# Patient Record
Sex: Male | Born: 1970
Health system: Southern US, Community
[De-identification: ages and names within clinical notes are randomized; demographics above are authoritative.]

## PROBLEM LIST (undated history)

## (undated) DIAGNOSIS — E119 Type 2 diabetes mellitus without complications: Secondary | ICD-10-CM

## (undated) DIAGNOSIS — I219 Acute myocardial infarction, unspecified: Secondary | ICD-10-CM

## (undated) DIAGNOSIS — I10 Essential (primary) hypertension: Secondary | ICD-10-CM

## (undated) DIAGNOSIS — G709 Myoneural disorder, unspecified: Secondary | ICD-10-CM

## (undated) DIAGNOSIS — N189 Chronic kidney disease, unspecified: Secondary | ICD-10-CM

## (undated) DIAGNOSIS — I255 Ischemic cardiomyopathy: Secondary | ICD-10-CM

## (undated) DIAGNOSIS — I509 Heart failure, unspecified: Secondary | ICD-10-CM

## (undated) DIAGNOSIS — E669 Obesity, unspecified: Secondary | ICD-10-CM

## (undated) DIAGNOSIS — E785 Hyperlipidemia, unspecified: Secondary | ICD-10-CM

## (undated) DIAGNOSIS — E11621 Type 2 diabetes mellitus with foot ulcer: Secondary | ICD-10-CM

## (undated) DIAGNOSIS — K219 Gastro-esophageal reflux disease without esophagitis: Secondary | ICD-10-CM

## (undated) DIAGNOSIS — I2510 Atherosclerotic heart disease of native coronary artery without angina pectoris: Secondary | ICD-10-CM

## (undated) DIAGNOSIS — L97425 Non-pressure chronic ulcer of left heel and midfoot with muscle involvement without evidence of necrosis: Secondary | ICD-10-CM

## (undated) DIAGNOSIS — I5022 Chronic systolic (congestive) heart failure: Secondary | ICD-10-CM

## (undated) HISTORY — DX: Type 2 diabetes mellitus with foot ulcer: E11.621

## (undated) HISTORY — DX: Essential (primary) hypertension: I10

## (undated) HISTORY — DX: Obesity, unspecified: E66.9

## (undated) HISTORY — DX: Hyperlipidemia, unspecified: E78.5

## (undated) HISTORY — PX: OTHER SURGICAL HISTORY: SHX169

## (undated) HISTORY — DX: Non-pressure chronic ulcer of left heel and midfoot with muscle involvement without evidence of necrosis: L97.425

## (undated) HISTORY — DX: Ischemic cardiomyopathy: I25.5

## (undated) HISTORY — DX: Type 2 diabetes mellitus without complications: E11.9

## (undated) HISTORY — DX: Atherosclerotic heart disease of native coronary artery without angina pectoris: I25.10

## (undated) HISTORY — DX: Chronic systolic (congestive) heart failure: I50.22

---
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
## 2007-02-23 ENCOUNTER — Emergency Department (HOSPITAL_COMMUNITY): Admission: EM | Admit: 2007-02-23 | Discharge: 2007-02-23 | Payer: Self-pay | Admitting: Emergency Medicine

## 2010-10-27 ENCOUNTER — Emergency Department (HOSPITAL_COMMUNITY)
Admission: EM | Admit: 2010-10-27 | Discharge: 2010-10-27 | Payer: Self-pay | Source: Home / Self Care | Admitting: Emergency Medicine

## 2010-12-10 ENCOUNTER — Inpatient Hospital Stay (HOSPITAL_COMMUNITY)
Admission: EM | Admit: 2010-12-10 | Discharge: 2010-12-11 | Payer: Self-pay | Source: Home / Self Care | Attending: Family Medicine | Admitting: Family Medicine

## 2010-12-14 ENCOUNTER — Ambulatory Visit: Payer: Self-pay | Admitting: Family Medicine

## 2010-12-14 DIAGNOSIS — E669 Obesity, unspecified: Secondary | ICD-10-CM

## 2010-12-14 DIAGNOSIS — N179 Acute kidney failure, unspecified: Secondary | ICD-10-CM

## 2010-12-14 DIAGNOSIS — I10 Essential (primary) hypertension: Secondary | ICD-10-CM

## 2010-12-14 DIAGNOSIS — E1169 Type 2 diabetes mellitus with other specified complication: Secondary | ICD-10-CM

## 2010-12-14 DIAGNOSIS — E1165 Type 2 diabetes mellitus with hyperglycemia: Secondary | ICD-10-CM | POA: Insufficient documentation

## 2010-12-14 DIAGNOSIS — E785 Hyperlipidemia, unspecified: Secondary | ICD-10-CM

## 2011-01-31 NOTE — Assessment & Plan Note (Signed)
Summary: NP, Hosp f/u for HTN urgency, DM2, HLD, ARF   Vital Signs:  Patient profile:   40 year old male Weight:      292.3 pounds Temp:     97.8 degrees F oral Pulse rate:   92 / minute BP sitting:   155 / 129  (left arm) Cuff size:   large  Vitals Entered By: Jimmy Footman, CMA (December 14, 2010 11:10 AM) CC: hospital follow up////HTN, DM2, HLD, ARF Is Patient Diabetic? No   CC:  hospital follow up////HTN, DM2, HLD, and ARF.  History of Present Illness: PT comes in after being an unassigned patient at [facility]. He has not been to an MD in 1-2 years. He has a remote history of diabetes (was on Janumet) and is not currently taking anything. He was admitted for hypertensive crisis with a BP of > 200/100 and chest pain. He was ruled out for MI and BP was improved  by the time he left the hospital.   hypertension: Pt comes in today still hypertensive. Her is currently taking HCTZ an Metoprolol 25 two times a day. Pt is not having CP anymore. His BNP was 577 when he was in the ED but pt did not show significant signs of CHF. TSH was normal.   DM2: Pt had an A1c of 10.1 while in the hospital. He has not been on any meds for a least 1.5 years. He use to  use samples from the doctors office before and then quit going. He hasn't had any meds for this problem  recently.   HLD:  Pt had an elevated LDL for someone who has diabetes. It was 113. His HDL is low at 30. HE is not currently on meds or an ASA.   ARF: Pt was seen for ARF in the hospital. He has a baseline Cr of 1.1 and it was 1.8 when he got to the ED. His Cr went down to 1.4 upon d/c. Will need to monitor this.   Habits & Providers  Alcohol-Tobacco-Diet     Tobacco Status: never  Current Medications (verified): 1)  Metoprolol Tartrate 25 Mg Tabs (Metoprolol Tartrate) .[name].. 1 By Mouth Two Times A Day 2)  Aspirin 81 Mg Tbec (Aspirin) .... Take 1 Tablet Daily For Heart Attack and Stroke Prevention 3)  Glipizide 5 Mg Tabs (Glipizide)  .... Take 5 Mg Every Morning With Breakfast. You May Increase To One Morning and Evening If Blood Glucose Is > 140 For 7 Days in A Row. 4)  Simvastatin 20 Mg Tabs (Simvastatin) .... Take 1 Pill Every Evening To Get Your Cholesterol Down To 70 5)  Lisinopril-Hydrochlorothiazide 20-25 Mg Tabs (Lisinopril-Hydrochlorothiazide) .... Take 1 By Mouth Qday For Blood Pressure Control and Kidney Protection. Stop This Pill If You Get Dehydrated.  Allergies (verified): No Known Drug Allergies  Past History:  Past Medical History: hypertension DM2  Past Surgical History: none  Family History: dad alive age 61  mom alive age 4  stroke in family history  Social History: lives alone, never married, no exercise, no tobacco, no alcohol, no drugsSmoking Status:  never  Review of Systems        vitals reviewed and pertinent negatives and positives seen in HPI   Physical Exam  General:  Well-developed,well-nourished,in no acute distress; alert,appropriate and cooperative throughout examination. BP retaken with similar results to original reading. 150's/120 Lungs:  Normal respiratory effort, chest expands symmetrically. Lungs are clear to auscultation, no crackles or wheezes. Heart:  Normal  rate and regular rhythm. S1 and S2 normal without gallop, murmur, click, rub or other extra sounds.   Impression & Recommendations:  Problem # 1:  HYPERTENSION, CHRONIC VENOUS, WITH COMPLICATIONS (ICD-459.39) Assessment New Pt'sBP is still very high. Added Lisinopril to help with BP as well as protect kidneys. Will retest kidneys the next time he comes. Pt is to come in about 3 weeks to 1 month (after the holidays). Pt given info about [employer].   Problem # 2:  DIABETES MELLITUS, TYPE II, UNCONTROLLED, W/RENAL COMPS (ICD-250.42) Assessment: New Pt has DM2 with an A1c of 10.1 He has not been on meds for a long while. Plan to start him on BP meds. Pt given an Accucheck meter today for free. PharmD  resident demonstrated how to use it. Pt is to bring meter and log with him to next appointment.   His updated medication list for this problem includes:    Aspirin 81 Mg Tbec (Aspirin) .[name]... Take 1 tablet daily for heart attack and stroke prevention    Glipizide 5 Mg Tabs (Glipizide) .[name]... Take 5 mg every morning with breakfast. you may increase to one morning and evening if blood glucose is > 140 for 7 days in a row.    Lisinopril-hydrochlorothiazide 20-25 Mg Tabs (Lisinopril-hydrochlorothiazide) .[name]... Take 1 by mouth qday for blood pressure control and kidney protection. stop this pill if you get dehydrated.  Problem # 3:  HYPERLIPIDEMIA (ICD-272.4) Assessment: New PT had LDL of 113. Will have goal of 70. Will need to retest his FLP in about 2 months.   His updated medication list for this problem includes:    Simvastatin 20 Mg Tabs (Simvastatin) .[name]... Take 1 pill every evening to get your cholesterol down to 70  Problem # 4:  ACUTE KIDNEY FAILURE UNSPECIFIED (ICD-584.9) Assessment: New PT had some ARF upon admission. Plan to treat him with some hydration (encouraged lots of water) and pt started on Lisinopril. Will retest at his next visit. It improved nicely while in the hospital. I expect he is at baseline. If his kidney fxn gets acutely worse will stop lisinopril.   Complete Medication List: 1)  Metoprolol Tartrate 25 Mg Tabs (Metoprolol tartrate) .[name].. 1 by mouth two times a day 2)  Aspirin 81 Mg Tbec (Aspirin) .... Take 1 tablet daily for heart attack and stroke prevention 3)  Glipizide 5 Mg Tabs (Glipizide) .... Take 5 mg every morning with breakfast. you may increase to one morning and evening if blood glucose is > 140 for 7 days in a row. 4)  Simvastatin 20 Mg Tabs (Simvastatin) .... Take 1 pill every evening to get your cholesterol down to 70 5)  Lisinopril-hydrochlorothiazide 20-25 Mg Tabs (Lisinopril-hydrochlorothiazide) .... Take 1 by mouth qday for blood pressure control and  kidney protection. stop this pill if you get dehydrated.  Patient Instructions: 1)  Your A1c is 10.1, your goal is 7.0 2)  Start taking the glipizide to decrease your blood glucose.  3)  test you blood glucose every morning before you eat or drink anything. Please write it down and bring in your log to help [place] determine when to change meds.  4)  Your BP is 155/120 today. Start your new combo blood pressure pill.  5)  I want to see you in the first week or 2 of January to retest your blood pressure and see how your glucose is doing.  Prescriptions: LISINOPRIL-HYDROCHLOROTHIAZIDE 20-25 MG TABS (LISINOPRIL-HYDROCHLOROTHIAZIDE) take 1 by mouth qday for blood  pressure control and kidney protection. Stop this pill if you get dehydrated.  #30 x 4   Entered and Authorized by:   Jamie Brookes MD   Signed by:   Jamie Brookes MD on 12/14/2010   Method used:   Electronically to        [facility] (retail)       [address]       [place], [place]  [address]       Ph: [phone]       Fax: [phone]   RxID:   [id number] SIMVASTATIN 20 MG TABS (SIMVASTATIN) take 1 pill every evening to get your cholesterol down to 70  #30 x 4   Entered and Authorized by:   Jamie Brookes MD   Signed by:   Jamie Brookes MD on 12/14/2010   Method used:   Electronically to        [facility] (retail)       [address]       [place], [place]  [address]       Ph: [phone]       Fax: [phone]   RxID:   [id number] GLIPIZIDE 5 MG TABS (GLIPIZIDE) take 5 mg every morning with breakfast. You may increase to one morning and evening if blood glucose is > 140 for 7 days in a row.  #60 x 1   Entered and Authorized by:   Jamie Brookes MD   Signed by:   Jamie Brookes MD on 12/14/2010   Method used:   Electronically to        [facility] (retail)       [address]       [place], [place]  [address]        Ph: [phone]       Fax: [phone]   RxID:   [id number] ASPIRIN 81 MG TBEC (ASPIRIN) take 1 tablet daily for heart attack and stroke prevention  #90 x 3   Entered and Authorized by:   Jamie Brookes MD   Signed by:   Jamie Brookes MD on 12/14/2010   Method used:   Electronically to        [facility] (retail)       [address]       [place], [place]  [address]       Ph: [phone]       Fax: [phone]   RxID:   [id number] METOPROLOL TARTRATE 25 MG TABS (METOPROLOL TARTRATE) 1 by mouth two times a day  #60 x 0   Entered and Authorized by:   Jamie Brookes MD   Signed by:   Jamie Brookes MD on 12/14/2010   Method used:   Electronically to        [facility] (retail)       [address]       [place], [place]  [address]       Ph: [phone]       Fax: [phone]   RxID:   [id number]    Orders Added: 1)  [facility] [[id number]]

## 2011-03-02 ENCOUNTER — Inpatient Hospital Stay (INDEPENDENT_AMBULATORY_CARE_PROVIDER_SITE_OTHER)
Admission: RE | Admit: 2011-03-02 | Discharge: 2011-03-02 | Disposition: A | Discharge status: Home / Self Care | Payer: Self-pay | Source: Ambulatory Visit | Attending: Family Medicine | Admitting: Family Medicine

## 2011-03-02 DIAGNOSIS — J069 Acute upper respiratory infection, unspecified: Secondary | ICD-10-CM

## 2011-03-02 LAB — POCT I-STAT, CHEM 8
BUN: 26 mg/dL — ABNORMAL HIGH (ref 6–23)
Chloride: 98 mEq/L (ref 96–112)
HCT: 44 % (ref 39.0–52.0)
Potassium: 3.1 mEq/L — ABNORMAL LOW (ref 3.5–5.1)

## 2011-03-12 LAB — URINE MICROSCOPIC-ADD ON

## 2011-03-12 LAB — URINALYSIS, ROUTINE W REFLEX MICROSCOPIC
Glucose, UA: NEGATIVE mg/dL
Ketones, ur: NEGATIVE mg/dL
Leukocytes, UA: NEGATIVE
Nitrite: NEGATIVE
Specific Gravity, Urine: 1.01 (ref 1.005–1.030)
pH: 6 (ref 5.0–8.0)

## 2011-03-12 LAB — LIPID PANEL
Triglycerides: 117 mg/dL (ref <150)
VLDL: 23 mg/dL (ref 0–40)

## 2011-03-12 LAB — HEMOGLOBIN A1C
Hgb A1c MFr Bld: 10.1 % — ABNORMAL HIGH (ref <5.7)
Mean Plasma Glucose: 243 mg/dL — ABNORMAL HIGH (ref <117)

## 2011-03-12 LAB — PROTIME-INR: Prothrombin Time: 14.1 seconds (ref 11.6–15.2)

## 2011-03-12 LAB — COMPREHENSIVE METABOLIC PANEL
Alkaline Phosphatase: 38 U/L — ABNORMAL LOW (ref 39–117)
BUN: 20 mg/dL (ref 6–23)
CO2: 28 mEq/L (ref 19–32)
Chloride: 104 mEq/L (ref 96–112)
Glucose, Bld: 195 mg/dL — ABNORMAL HIGH (ref 70–99)
Potassium: 3.7 mEq/L (ref 3.5–5.1)
Total Bilirubin: 1 mg/dL (ref 0.3–1.2)

## 2011-03-12 LAB — RAPID URINE DRUG SCREEN, HOSP PERFORMED
Barbiturates: NOT DETECTED
Benzodiazepines: NOT DETECTED

## 2011-03-12 LAB — DIFFERENTIAL
Basophils Absolute: 0 10*3/uL (ref 0.0–0.1)
Lymphocytes Relative: 26 % (ref 12–46)
Lymphs Abs: 3.3 10*3/uL (ref 0.7–4.0)
Monocytes Absolute: 0.9 10*3/uL (ref 0.1–1.0)
Neutro Abs: 8.1 10*3/uL — ABNORMAL HIGH (ref 1.7–7.7)

## 2011-03-12 LAB — CK TOTAL AND CKMB (NOT AT ARMC)
CK, MB: 3.5 ng/mL (ref 0.3–4.0)
Total CK: 243 U/L — ABNORMAL HIGH (ref 7–232)

## 2011-03-12 LAB — MRSA PCR SCREENING: MRSA by PCR: NEGATIVE

## 2011-03-12 LAB — POCT I-STAT, CHEM 8
Chloride: 105 mEq/L (ref 96–112)
Creatinine, Ser: 1.8 mg/dL — ABNORMAL HIGH (ref 0.4–1.5)
HCT: 44 % (ref 39.0–52.0)
Hemoglobin: 15 g/dL (ref 13.0–17.0)
Potassium: 3.5 mEq/L (ref 3.5–5.1)
Sodium: 138 mEq/L (ref 135–145)

## 2011-03-12 LAB — BRAIN NATRIURETIC PEPTIDE: Pro B Natriuretic peptide (BNP): 577 pg/mL — ABNORMAL HIGH (ref 0.0–100.0)

## 2011-03-12 LAB — POCT CARDIAC MARKERS: Myoglobin, poc: 131 ng/mL (ref 12–200)

## 2011-03-12 LAB — ETHANOL: Alcohol, Ethyl (B): <5 mg/dL (ref 0–10)

## 2011-03-12 LAB — CBC
HCT: 43.1 % (ref 39.0–52.0)
RBC: 5.5 MIL/uL (ref 4.22–5.81)
RDW: 13.3 % (ref 11.5–15.5)
WBC: 12.4 10*3/uL — ABNORMAL HIGH (ref 4.0–10.5)

## 2011-03-12 LAB — TROPONIN I: Troponin I: 0.06 ng/mL (ref 0.00–0.06)

## 2011-03-13 LAB — POCT I-STAT, CHEM 8
BUN: 20 mg/dL (ref 6–23)
Calcium, Ion: 1.15 mmol/L (ref 1.12–1.32)
Chloride: 103 mEq/L (ref 96–112)
Creatinine, Ser: 1.1 mg/dL (ref 0.4–1.5)
Glucose, Bld: 236 mg/dL — ABNORMAL HIGH (ref 70–99)
TCO2: 26 mmol/L (ref 0–100)

## 2011-03-13 LAB — CBC
HCT: 37.5 % — ABNORMAL LOW (ref 39.0–52.0)
Hemoglobin: 12.5 g/dL — ABNORMAL LOW (ref 13.0–17.0)
MCV: 78.1 fL (ref 78.0–100.0)
RDW: 12.6 % (ref 11.5–15.5)
WBC: 12.3 10*3/uL — ABNORMAL HIGH (ref 4.0–10.5)

## 2011-03-13 LAB — DIFFERENTIAL
Eosinophils Relative: 1 % (ref 0–5)
Lymphocytes Relative: 17 % (ref 12–46)
Lymphs Abs: 2.1 10*3/uL (ref 0.7–4.0)
Monocytes Absolute: 1.1 10*3/uL — ABNORMAL HIGH (ref 0.1–1.0)
Monocytes Relative: 9 % (ref 3–12)
Neutro Abs: 9 10*3/uL — ABNORMAL HIGH (ref 1.7–7.7)

## 2011-05-18 ENCOUNTER — Other Ambulatory Visit: Payer: Self-pay | Admitting: Family Medicine

## 2011-05-19 NOTE — Telephone Encounter (Signed)
Refill request

## 2011-11-02 ENCOUNTER — Inpatient Hospital Stay (HOSPITAL_COMMUNITY)
Admission: EM | Admit: 2011-11-02 | Discharge: 2011-11-06 | DRG: 246 | Disposition: A | Discharge status: Home / Self Care | Payer: Self-pay | Attending: Internal Medicine | Admitting: Internal Medicine

## 2011-11-02 ENCOUNTER — Emergency Department (HOSPITAL_COMMUNITY): Payer: Self-pay

## 2011-11-02 DIAGNOSIS — E669 Obesity, unspecified: Secondary | ICD-10-CM | POA: Diagnosis present

## 2011-11-02 DIAGNOSIS — N179 Acute kidney failure, unspecified: Secondary | ICD-10-CM

## 2011-11-02 DIAGNOSIS — Z91199 Patient's noncompliance with other medical treatment and regimen due to unspecified reason: Secondary | ICD-10-CM

## 2011-11-02 DIAGNOSIS — I2589 Other forms of chronic ischemic heart disease: Secondary | ICD-10-CM | POA: Diagnosis present

## 2011-11-02 DIAGNOSIS — IMO0001 Reserved for inherently not codable concepts without codable children: Secondary | ICD-10-CM | POA: Diagnosis present

## 2011-11-02 DIAGNOSIS — Z9119 Patient's noncompliance with other medical treatment and regimen: Secondary | ICD-10-CM

## 2011-11-02 DIAGNOSIS — E1165 Type 2 diabetes mellitus with hyperglycemia: Secondary | ICD-10-CM | POA: Diagnosis present

## 2011-11-02 DIAGNOSIS — I5021 Acute systolic (congestive) heart failure: Secondary | ICD-10-CM

## 2011-11-02 DIAGNOSIS — I509 Heart failure, unspecified: Secondary | ICD-10-CM | POA: Diagnosis present

## 2011-11-02 DIAGNOSIS — I5023 Acute on chronic systolic (congestive) heart failure: Secondary | ICD-10-CM | POA: Diagnosis present

## 2011-11-02 DIAGNOSIS — E785 Hyperlipidemia, unspecified: Secondary | ICD-10-CM | POA: Diagnosis present

## 2011-11-02 DIAGNOSIS — I214 Non-ST elevation (NSTEMI) myocardial infarction: Principal | ICD-10-CM | POA: Diagnosis present

## 2011-11-02 DIAGNOSIS — I255 Ischemic cardiomyopathy: Secondary | ICD-10-CM | POA: Diagnosis present

## 2011-11-02 DIAGNOSIS — I2510 Atherosclerotic heart disease of native coronary artery without angina pectoris: Secondary | ICD-10-CM | POA: Diagnosis present

## 2011-11-02 DIAGNOSIS — I10 Essential (primary) hypertension: Secondary | ICD-10-CM | POA: Diagnosis present

## 2011-11-02 LAB — COMPREHENSIVE METABOLIC PANEL
Alkaline Phosphatase: 72 U/L (ref 39–117)
BUN: 23 mg/dL (ref 6–23)
Creatinine, Ser: 1.06 mg/dL (ref 0.50–1.35)
GFR calc Af Amer: >90 mL/min (ref >90)
Glucose, Bld: 414 mg/dL — ABNORMAL HIGH (ref 70–99)
Potassium: 3.7 mEq/L (ref 3.5–5.1)
Total Bilirubin: 0.5 mg/dL (ref 0.3–1.2)
Total Protein: 7.2 g/dL (ref 6.0–8.3)

## 2011-11-02 LAB — GLUCOSE, CAPILLARY: Glucose-Capillary: 315 mg/dL — ABNORMAL HIGH (ref 70–99)

## 2011-11-02 LAB — CBC
HCT: 44.8 % (ref 39.0–52.0)
Hemoglobin: 16.2 g/dL (ref 13.0–17.0)
MCH: 28.6 pg (ref 26.0–34.0)
MCHC: 36.2 g/dL — ABNORMAL HIGH (ref 30.0–36.0)
MCV: 79.2 fL (ref 78.0–100.0)
Platelets: 213 10*3/uL (ref 150–400)
RBC: 5.66 MIL/uL (ref 4.22–5.81)
RDW: 12.7 % (ref 11.5–15.5)
WBC: 11.9 10*3/uL — ABNORMAL HIGH (ref 4.0–10.5)

## 2011-11-02 LAB — DIFFERENTIAL
Basophils Absolute: 0 10*3/uL (ref 0.0–0.1)
Basophils Relative: 0 % (ref 0–1)
Eosinophils Absolute: 0.2 10*3/uL (ref 0.0–0.7)
Eosinophils Relative: 2 % (ref 0–5)
Lymphocytes Relative: 26 % (ref 12–46)
Lymphs Abs: 3.1 10*3/uL (ref 0.7–4.0)
Monocytes Absolute: 0.7 10*3/uL (ref 0.1–1.0)
Monocytes Relative: 6 % (ref 3–12)
Neutro Abs: 7.9 10*3/uL — ABNORMAL HIGH (ref 1.7–7.7)
Neutrophils Relative %: 67 % (ref 43–77)

## 2011-11-02 LAB — CK TOTAL AND CKMB (NOT AT ARMC)
CK, MB: 12.3 ng/mL (ref 0.3–4.0)
Relative Index: 4.4 — ABNORMAL HIGH (ref 0.0–2.5)
Total CK: 278 U/L — ABNORMAL HIGH (ref 7–232)

## 2011-11-02 LAB — POCT I-STAT TROPONIN I: Troponin i, poc: 0.71 ng/mL (ref 0.00–0.08)

## 2011-11-02 LAB — TROPONIN I: Troponin I: 2.93 ng/mL (ref <0.30)

## 2011-11-02 MED ORDER — NITROGLYCERIN 0.4 MG SL SUBL
0.4000 mg | SUBLINGUAL_TABLET | SUBLINGUAL | Status: DC | PRN
  Filled 2011-11-02: qty 25

## 2011-11-02 MED ORDER — SODIUM CHLORIDE 0.9 % IV SOLN: INTRAVENOUS | Status: DC

## 2011-11-02 MED ORDER — PANTOPRAZOLE SODIUM 40 MG PO TBEC
40.0000 mg | DELAYED_RELEASE_TABLET | Freq: Every day | ORAL | Status: DC
  Administered 2011-11-03 – 2011-11-05 (×4): 40 mg via ORAL
  Filled 2011-11-02 (×3): qty 1

## 2011-11-02 MED ORDER — INSULIN ASPART 100 UNIT/ML ~~LOC~~ SOLN
0.0000 [IU] | Freq: Three times a day (TID) | SUBCUTANEOUS | Status: DC
  Administered 2011-11-03 (×2): 8 [IU] via SUBCUTANEOUS

## 2011-11-02 MED ORDER — ENOXAPARIN SODIUM 40 MG/0.4ML ~~LOC~~ SOLN
40.0000 mg | SUBCUTANEOUS | Status: DC
  Filled 2011-11-02: qty 0.4

## 2011-11-02 MED ORDER — METOPROLOL TARTRATE 50 MG PO TABS
50.0000 mg | ORAL_TABLET | Freq: Two times a day (BID) | ORAL | Status: DC
  Administered 2011-11-03 (×2): 50 mg via ORAL
  Filled 2011-11-02 (×9): qty 1

## 2011-11-02 MED ORDER — ENOXAPARIN SODIUM 150 MG/ML ~~LOC~~ SOLN
130.0000 mg | Freq: Two times a day (BID) | SUBCUTANEOUS | Status: DC
  Administered 2011-11-03 (×2): 130 mg via SUBCUTANEOUS
  Filled 2011-11-02 (×9): qty 1

## 2011-11-02 MED ORDER — MORPHINE SULFATE 2 MG/ML IJ SOLN
2.0000 mg | INTRAMUSCULAR | Status: DC | PRN
  Administered 2011-11-04: 2 mg via INTRAVENOUS
  Filled 2011-11-02: qty 1

## 2011-11-02 MED ORDER — ASPIRIN EC 325 MG PO TBEC
325.0000 mg | DELAYED_RELEASE_TABLET | Freq: Every day | ORAL | Status: DC
  Administered 2011-11-03 (×2): 325 mg via ORAL
  Filled 2011-11-02: qty 1

## 2011-11-02 NOTE — H&P (Signed)
NAME:  [name], CIFELLI NO.:  [id number]  MEDICAL RECORD NO.:  [id number]  LOCATION:  [facility]                         FACILITY:  [facility]  PHYSICIAN:  Talmage Nap, MD  DATE OF BIRTH:  October 24, 1971  DATE OF ADMISSION:  11/02/2011 DATE OF DISCHARGE:                             HISTORY & PHYSICAL   PRIMARY CARE PHYSICIAN:  Unassigned.  History obtainable from the patient.  CHIEF COMPLAINT:  Retrosternal chest pain, which started at work at about 11:30 a.m. on November 02, 2011.  HISTORY OF PRESENT ILLNESS:  The patient is a 40 year old Caucasian male, obese with a history of hypertension, noncompliant with medication (has not taken medication in months) and also hyperglycemia, not very sure whether he was diabetic and not on any medication, was said to have been in stable health until about 11:30 a.m. at work when the patient developed retrosternal chest pain.  He described the pain as dull about 6/10 in intensity with no radiation.  He denied any history of diaphoresis.  He denied any history of shortness of breath.  He denied any nausea or vomiting.  No fever.  No chills.  No rigor.  The patient took 1 tablet of baby aspirin.  The pain was said to have persisted and subsequently came to the emergency room to be evaluated.  PAST MEDICAL HISTORY:  Positive for hypertension and questionable diabetes mellitus.  PAST SURGICAL HISTORY:  He has no known past surgical history.  MEDICATIONS:  Preadmission med include lisinopril doses unknown as well as hydrochlorothiazide doses unknown.  ALLERGIES:  He has no known allergies.  SOCIAL HISTORY:  Negative for tobacco use.  He drinks alcohol occasionally and works in a [employer].  FAMILY HISTORY:  York Spaniel to be positive for coronary artery disease.  Mom had a bypass surgery.  REVIEW OF SYSTEMS:  The patient presently denies any history of headaches.  No blurred vision.  No nausea or vomiting.  No fever.  No chills.   No rigor.  Chest pain has abated.  No shortness of breath. Denies any PND or orthopnea.  No abdominal discomfort.  No diarrhea or hematochezia.  No dysuria or hematuria.  No swelling of the lower extremity.  No intolerance to heat or cold and no neuropsychiatric disorder.  PHYSICAL EXAMINATION:  GENERAL:  Obese young man with suboptimal hydration, not in any obvious respiratory distress. VITAL SIGNS:  Initial vital signs, blood pressure 174/134, pulse 117, respiratory rate is 15, temperature is 97.4.  Following IV nitro drip, blood pressure is now 127/78, pulse 92, respiratory rate is 16. HEENT:  Pupils are reactive to light and extraocular muscles are intact. NECK:  No jugular venous distention.  No carotid bruit.  No lymphadenopathy. CHEST:  Clear to auscultation. HEART:  Sounds are 1 and 2. ABDOMEN:  Soft, obese, and nontender.  Liver, spleen, and kidney not palpable.  Bowel sounds are positive. EXTREMITIES:  No pedal edema. NEUROLOGIC:  Nonfocal. MUSCULOSKELETAL SYSTEM:  Unremarkable. SKIN:  Showed decreased turgor.  LABORATORY DATA:  Initial complete blood count with differential showed WBC of 11.9, hemoglobin of 16.2, hematocrit of 44.8, MCV of 78.2, platelet count of 213, neutrophils 67%, and absolute neutrophil count is 7.9.  First set of cardiac markers, troponin-I 0.07 and second set 0.71. CK, total of CK-MB not ordered.  Comprehensive metabolic panel showed sodium of 132, potassium of 3.7, chloride of 95 with a bicarb of 25, glucose is 414, BUN is 23, creatinine is 1.03.  LFTs are normal. Imaging studies done on the patient include portable chest x-ray, which was essentially unremarkable.  EKG showed normal sinus rhythm with a rate of 118, left atrial enlargement as well as LVH by voltage criteria. There is no acute ST-wave change noted.  There is nonspecific T-wave changes in the anterolateral leads.  IMPRESSION: 1. Chest pain, rule out acute coronary syndrome. 2.  Hypertensive emergency. 3. Obesity. 4. Hyperglycemia (non-ketotic).  PLAN:  Plan is to admit the patient to telemetry.  The patient will be rehydrated with normal saline IV to go at rate of 150 mL an hour.  He will be on Accu-Cheks t.i.d. with a.c. at bedtime with regular insulin sliding scale (moderate scale).  He will also be on aspirin 325 mg p.o. daily.  The patient will be on morphine 2 mg IV q.4 hours p.r.n. for chest pain.  Since his blood pressure is now controlled, nitro drip will be discontinued.  The patient will be on Lopressor 150 mg p.o. b.i.d., lisinopril 20 mg p.o. daily. GI prophylaxis will be done with Protonix 40 mg p.o. daily and DVT prophylaxis with Lovenox 40 mg subcu q.24 hours.  Further workup to be ordered on this patient will include cardiac enzymes q.6 h x3, lipid panel, thyroid panel, which will include TSH, T3, T4.  Hemoglobin A1c, CBC, CMP, and magnesium will be repeated in a.m.  The patient will also have a 2D echo done.  He will be followed and evaluated on day-to-day basis.     Talmage Nap, MD     CN/MEDQ  D:  11/02/2011  T:  11/02/2011  Job:  [id number]  Electronically Signed by Talmage Nap  on 11/02/2011 06:41:15 PM

## 2011-11-03 ENCOUNTER — Other Ambulatory Visit: Payer: Self-pay

## 2011-11-03 DIAGNOSIS — R072 Precordial pain: Secondary | ICD-10-CM

## 2011-11-03 DIAGNOSIS — R079 Chest pain, unspecified: Secondary | ICD-10-CM

## 2011-11-03 DIAGNOSIS — I214 Non-ST elevation (NSTEMI) myocardial infarction: Secondary | ICD-10-CM | POA: Diagnosis present

## 2011-11-03 LAB — COMPREHENSIVE METABOLIC PANEL
Alkaline Phosphatase: 62 U/L (ref 39–117)
BUN: 21 mg/dL (ref 6–23)
Chloride: 98 mEq/L (ref 96–112)
GFR calc Af Amer: >90 mL/min (ref >90)
GFR calc non Af Amer: >90 mL/min (ref >90)
Glucose, Bld: 301 mg/dL — ABNORMAL HIGH (ref 70–99)
Potassium: 3.8 mEq/L (ref 3.5–5.1)
Total Bilirubin: 0.6 mg/dL (ref 0.3–1.2)

## 2011-11-03 LAB — PROTIME-INR
INR: 1.01 (ref 0.00–1.49)
Prothrombin Time: 13.5 seconds (ref 11.6–15.2)

## 2011-11-03 LAB — CBC
HCT: 41.7 % (ref 39.0–52.0)
Hemoglobin: 13.6 g/dL (ref 13.0–17.0)
MCH: 26.4 pg (ref 26.0–34.0)
MCH: 28.6 pg (ref 26.0–34.0)
MCHC: 35.5 g/dL (ref 30.0–36.0)
MCV: 80.6 fL (ref 78.0–100.0)
Platelets: 222 10*3/uL (ref 150–400)
RBC: 5.15 MIL/uL (ref 4.22–5.81)
RBC: 5.1 MIL/uL (ref 4.22–5.81)
RDW: 12.8 % (ref 11.5–15.5)

## 2011-11-03 LAB — T4, FREE: Free T4: 1.08 ng/dL (ref 0.80–1.80)

## 2011-11-03 LAB — DIFFERENTIAL
Lymphs Abs: 1.9 10*3/uL (ref 0.7–4.0)
Monocytes Relative: 5 % (ref 3–12)
Neutro Abs: 7.5 10*3/uL (ref 1.7–7.7)
Neutrophils Relative %: 75 % (ref 43–77)

## 2011-11-03 LAB — GLUCOSE, CAPILLARY
Glucose-Capillary: 320 mg/dL — ABNORMAL HIGH (ref 70–99)
Glucose-Capillary: 311 mg/dL — ABNORMAL HIGH (ref 70–99)
Glucose-Capillary: 256 mg/dL — ABNORMAL HIGH (ref 70–99)
Glucose-Capillary: 212 mg/dL — ABNORMAL HIGH (ref 70–99)
Glucose-Capillary: 220 mg/dL — ABNORMAL HIGH (ref 70–99)

## 2011-11-03 LAB — BASIC METABOLIC PANEL
CO2: 28 mEq/L (ref 19–32)
Calcium: 9.1 mg/dL (ref 8.4–10.5)
Creatinine, Ser: 1.08 mg/dL (ref 0.50–1.35)
GFR calc non Af Amer: 86 mL/min — ABNORMAL LOW (ref >90)
Glucose, Bld: 263 mg/dL — ABNORMAL HIGH (ref 70–99)
Sodium: 135 mEq/L (ref 135–145)

## 2011-11-03 LAB — T4: T4, Total: 7.7 ug/dL (ref 5.0–12.5)

## 2011-11-03 LAB — HEMOGLOBIN A1C
Hgb A1c MFr Bld: 13 % — ABNORMAL HIGH (ref <5.7)
Mean Plasma Glucose: 324 mg/dL — ABNORMAL HIGH (ref <117)

## 2011-11-03 LAB — MAGNESIUM: Magnesium: 1.7 mg/dL (ref 1.5–2.5)

## 2011-11-03 LAB — CARDIAC PANEL(CRET KIN+CKTOT+MB+TROPI)
Relative Index: 5.3 — ABNORMAL HIGH (ref 0.0–2.5)
Troponin I: 5.11 ng/mL (ref <0.30)

## 2011-11-03 LAB — T3, FREE: T3, Free: 2.8 pg/mL (ref 2.3–4.2)

## 2011-11-03 MED ORDER — SODIUM CHLORIDE 0.9 % IV SOLN
1.0000 mL/kg/h | INTRAVENOUS | Status: DC
  Administered 2011-11-04: 1 mL/kg/h via INTRAVENOUS

## 2011-11-03 MED ORDER — ROSUVASTATIN CALCIUM 40 MG PO TABS
40.0000 mg | ORAL_TABLET | ORAL | Status: AC
  Administered 2011-11-03: 40 mg via ORAL
  Filled 2011-11-03: qty 1

## 2011-11-03 MED ORDER — INSULIN ASPART 100 UNIT/ML ~~LOC~~ SOLN
0.0000 [IU] | SUBCUTANEOUS | Status: DC
  Administered 2011-11-03 – 2011-11-04 (×2): 3 [IU] via SUBCUTANEOUS
  Administered 2011-11-04 (×2): 5 [IU] via SUBCUTANEOUS
  Administered 2011-11-04: 2 [IU] via SUBCUTANEOUS

## 2011-11-03 MED ORDER — INSULIN ASPART 100 UNIT/ML ~~LOC~~ SOLN: 0.0000 [IU] | Freq: Every day | SUBCUTANEOUS | Status: DC

## 2011-11-03 MED ORDER — SODIUM CHLORIDE 0.9 % IJ SOLN: 3.0000 mL | INTRAMUSCULAR | Status: DC | PRN

## 2011-11-03 MED ORDER — SODIUM CHLORIDE 0.9 % IJ SOLN
3.0000 mL | Freq: Two times a day (BID) | INTRAMUSCULAR | Status: DC
  Administered 2011-11-03 – 2011-11-05 (×3): 3 mL via INTRAVENOUS

## 2011-11-03 MED ORDER — LISINOPRIL 20 MG PO TABS
20.0000 mg | ORAL_TABLET | Freq: Every day | ORAL | Status: DC
  Administered 2011-11-03 – 2011-11-06 (×4): 20 mg via ORAL
  Filled 2011-11-03 (×7): qty 1

## 2011-11-03 MED ORDER — ASPIRIN 81 MG PO CHEW
324.0000 mg | CHEWABLE_TABLET | ORAL | Status: AC
  Administered 2011-11-04: 324 mg via ORAL
  Filled 2011-11-03: qty 4

## 2011-11-03 MED ORDER — NITROGLYCERIN 2 % TD OINT
1.0000 [in_us] | TOPICAL_OINTMENT | Freq: Four times a day (QID) | TRANSDERMAL | Status: DC
  Administered 2011-11-03 (×2): 1 [in_us] via TOPICAL
  Filled 2011-11-03 (×14): qty 1

## 2011-11-03 MED ORDER — ROSUVASTATIN CALCIUM 40 MG PO TABS
40.0000 mg | ORAL_TABLET | ORAL | Status: DC
  Administered 2011-11-04 – 2011-11-06 (×2): 40 mg via ORAL
  Filled 2011-11-03 (×7): qty 1

## 2011-11-03 MED ORDER — SODIUM CHLORIDE 0.9 % IV SOLN: 250.0000 mL | INTRAVENOUS | Status: DC

## 2011-11-03 MED ORDER — SODIUM CHLORIDE 0.9 % IV SOLN
250.0000 mL | INTRAVENOUS | Status: DC
  Administered 2011-11-03: 250 mL via INTRAVENOUS

## 2011-11-03 MED ORDER — LEVALBUTEROL HCL 0.63 MG/3ML IN NEBU
0.6300 mg | INHALATION_SOLUTION | RESPIRATORY_TRACT | Status: DC | PRN
  Administered 2011-11-03: 0.63 mg via RESPIRATORY_TRACT
  Filled 2011-11-03 (×3): qty 3

## 2011-11-03 MED ORDER — ACETAMINOPHEN 325 MG PO TABS
650.0000 mg | ORAL_TABLET | ORAL | Status: DC | PRN
  Administered 2011-11-03: 650 mg via ORAL
  Filled 2011-11-03: qty 2

## 2011-11-03 NOTE — Progress Notes (Signed)
Subjective:  No additional chest pain. Blood pressure has improved. No sob, cough or hemoptysis  Objective:  Vital Signs in the last 24 hours: Temp:  [97.7 F (36.5 C)-98.9 F (37.2 C)] 98.6 F (37 C) (11/04 0500) Pulse Rate:  [91-105] 105  (11/04 0500) Resp:  [16-20] 16  (11/04 0500) BP: (125-155)/(81-109) 147/109 mmHg (11/04 0500) SpO2:  [97 %-100 %] 97 % (11/04 0500) Weight:  [295 lb (133.811 kg)-295 lb 4.8 oz (133.947 kg)] 295 lb (133.811 kg) (11/03 1900)  Intake/Output from previous day: 11/03 0701 - 11/04 0700 In: 1590 [P.O.:240; I.V.:1350] Out: 200 [Urine:200] Intake/Output from this shift:    Physical Exam: Well appearing obese, young man NAD HEENT: Unremarkable Neck:  No JVD, no thyromegally Lymphatics:  No adenopathy Back:  No CVA tenderness Lungs:  Clear with no wheezes, rales, or rhonchi. HEART:  Regular rate rhythm, no murmurs, no rubs, no clicks heart sounds are distant Abd:  obese, positive bowel sounds, no organomegally, no rebound, no guarding Ext:  2 plus pulses, no edema, no cyanosis, no clubbing Skin:  No rashes no nodules Neuro:  CN II through XII intact, motor grossly intact  Lab Results:  Basename 11/03/11 0630 11/02/11 1232  WBC 10.0 11.9*  HGB 13.6 16.2  PLT 202 213    Basename 11/03/11 0630 11/02/11 1232  NA 134* 132*  K 3.8 3.7  CL 98 95*  CO2 25 25  GLUCOSE 301* 414*  BUN 21 23  CREATININE 1.01 1.06    Basename 11/03/11 0530 11/03/11 0008  TROPONINI 6.13* 5.11*   Hepatic Function Panel  Basename 11/03/11 0630  PROT 6.7  ALBUMIN 3.4*  AST 39*  ALT 24  ALKPHOS 62  BILITOT 0.6  BILIDIR --  IBILI --   No results found for this basename: CHOL in the last 72 hours No results found for this basename: PROTIME in the last 72 hours  Imaging: Imaging results have been reviewed  Cardiac Studies:ECG NSR with no acute STT changes  Assessment/Plan:  Principal Problem:  *MI, acute, non ST segment elevation - He is pain free. He  will continue his current meds and undergo left heart catheterization tomorrow. Active Problems:  DIABETES MELLITUS, TYPE II, UNCONTROLLED, W/RENAL COMPS =- His sugar is better but will need additional treatment likely with insulin.  HYPERTENSION, CHRONIC VENOUS, WITH COMPLICATIONS - His blood pressure is improved but he will require additional medication. Will continue metoprolol for now.    LOS: 1 day    Lewayne Bunting 11/03/2011, 9:43 AM

## 2011-11-03 NOTE — Progress Notes (Signed)
  Echocardiogram 2D Echocardiogram with Definity has been performed.  [name] 11/03/2011, 5:39 PM

## 2011-11-03 NOTE — Progress Notes (Signed)
Subjective: Patient doing okay. Is tired. No chest pain or shortness of breath.  Objective: Weight change:   Intake/Output Summary (Last 24 hours) at 11/03/11 1255 Last data filed at 11/03/11 0500  Gross per 24 hour  Intake   1590 ml  Output    200 ml  Net   1390 ml   BP 147/109  Pulse 105  Temp(Src) 98.6 F (37 C) (Oral)  Resp 16  Ht 6\' 6"  (1.981 m)  Wt 133.811 kg (295 lb)  BMI 34.09 kg/m2  SpO2 97% General appearance: alert, cooperative and no distress Head: Normocephalic, without obvious abnormality, atraumatic Lungs: clear to auscultation bilaterally Heart: regular rate and rhythm, S1, S2 normal, no murmur, click, rub or gallop Abdomen: soft, non-tender; bowel sounds normal; no masses,  no organomegaly and Mild obesity Extremities: extremities normal, atraumatic, no cyanosis or edema  Lab Results: Basic Metabolic Panel:  Basename 11/03/11 0630 11/02/11 1232  NA 134* 132*  K 3.8 3.7  CL 98 95*  CO2 25 25  GLUCOSE 301* 414*  BUN 21 23  CREATININE 1.01 1.06  CALCIUM 9.1 10.1  MG 1.7 --  PHOS -- --   Liver Function Tests:  Basename 11/03/11 0630 11/02/11 1232  AST 39* 18  ALT 24 23  ALKPHOS 62 72  BILITOT 0.6 0.5  PROT 6.7 7.2  ALBUMIN 3.4* 3.8   No results found for this basename: LIPASE:2,AMYLASE:2 in the last 72 hours No results found for this basename: AMMONIA:2 in the last 72 hours CBC:  Basename 11/03/11 0630 11/02/11 1232  WBC 10.0 11.9*  NEUTROABS 7.5 7.9*  HGB 13.6 16.2  HCT 41.7 44.8  MCV 81.0 79.2  PLT 202 213   Cardiac Enzymes:  Basename 11/03/11 0530 11/03/11 0008 11/02/11 1708  CKTOTAL 380* 365* 278*  CKMB 19.5* 19.4* 12.3*  CKMBINDEX -- -- --  TROPONINI 6.13* 5.11* 2.93*   BNP: No results found for this basename: POCBNP:3 in the last 72 hours D-Dimer: No results found for this basename: DDIMER:2 in the last 72 hours CBG:  Basename 11/03/11 0835 11/02/11 2307 11/02/11 2048 11/02/11 1618  GLUCAP 264* 311* 320* 315*    Hemoglobin A1C:  Basename 11/02/11 1951  HGBA1C 13.0*   Thyroid Function Tests:  Basename 11/02/11 1951 11/02/11 1708  TSH 2.189 --  T4TOTAL -- 7.7  FREET4 1.08 --  T3FREE 2.8 --  THYROIDAB -- --   Anemia Panel: No results found for this basename: VITAMINB12,FOLATE,FERRITIN,TIBC,IRON,RETICCTPCT in the last 72 hours Coagulation:  Basename 11/03/11 0630  INR 1.01   Studies/Results: Dg Chest Portable 1 View  11/02/2011   IMPRESSION: Low volume exam.  No acute chest finding  Original Report Authenticated By: [name]. Ruel Favors, M.D.   Medications: Scheduled Meds:   . aspirin EC  325 mg Oral Daily  . enoxaparin (LOVENOX) injection  130 mg Subcutaneous Q12H  . insulin aspart  0-15 Units Subcutaneous TID WC  . insulin aspart  0-5 Units Subcutaneous QHS  . lisinopril  20 mg Oral Daily  . metoprolol tartrate  50 mg Oral BID  . nitroGLYCERIN  1 inch Topical Q6H  . pantoprazole  40 mg Oral Q1200  . rosuvastatin  40 mg Oral NOW   Followed by  . rosuvastatin  40 mg Oral Q24H  . DISCONTD: enoxaparin (LOVENOX) injection  40 mg Subcutaneous Q24H   Continuous Infusions:   . DISCONTD: sodium chloride    . DISCONTD: sodium chloride     PRN Meds:.levalbuterol, morphine injection, nitroGLYCERIN, DISCONTD:  sodium chloride  Assessment/Plan: Patient Active Hospital Problem List: MI, acute, non ST segment elevation (11/03/2011)   Assessment: Add nitro paste, for cath in the morning   Diabetes mellitus type 2, uncontrolled (12/14/2010)  very poorly controlled given elevated hemoglobin A1c. We'll start sliding scale especially after an by mouth every 4 hours.   HYPERTENSION, CHRONIC VENOUS, WITH COMPLICATIONS not on any medications at home. Will need beta blocker.  \ HYPERLIPIDEMIA (12/14/2010)  check labs in the morning.   LOS: 1 day   Hollice Espy 11/03/2011, 12:55 PM

## 2011-11-04 ENCOUNTER — Encounter (HOSPITAL_COMMUNITY)
Admission: EM | Disposition: A | Discharge status: Home / Self Care | Payer: Self-pay | Source: Home / Self Care | Attending: Internal Medicine

## 2011-11-04 ENCOUNTER — Other Ambulatory Visit: Payer: Self-pay

## 2011-11-04 DIAGNOSIS — I5021 Acute systolic (congestive) heart failure: Secondary | ICD-10-CM | POA: Diagnosis present

## 2011-11-04 DIAGNOSIS — I2510 Atherosclerotic heart disease of native coronary artery without angina pectoris: Secondary | ICD-10-CM

## 2011-11-04 DIAGNOSIS — I255 Ischemic cardiomyopathy: Secondary | ICD-10-CM | POA: Diagnosis present

## 2011-11-04 HISTORY — PX: LEFT AND RIGHT HEART CATHETERIZATION WITH CORONARY ANGIOGRAM: SHX5449

## 2011-11-04 HISTORY — PX: PERCUTANEOUS CORONARY STENT INTERVENTION (PCI-S): SHX5485

## 2011-11-04 LAB — LIPID PANEL
LDL Cholesterol: 137 mg/dL — ABNORMAL HIGH (ref 0–99)
VLDL: 63 mg/dL — ABNORMAL HIGH (ref 0–40)

## 2011-11-04 LAB — GLUCOSE, CAPILLARY: Glucose-Capillary: 284 mg/dL — ABNORMAL HIGH (ref 70–99)

## 2011-11-04 LAB — CREATININE, SERUM: GFR calc non Af Amer: >90 mL/min (ref >90)

## 2011-11-04 LAB — POCT I-STAT 3, VENOUS BLOOD GAS (G3P V)
Bicarbonate: 24.4 mEq/L — ABNORMAL HIGH (ref 20.0–24.0)
TCO2: 26 mmol/L (ref 0–100)
pH, Ven: 7.361 — ABNORMAL HIGH (ref 7.250–7.300)

## 2011-11-04 LAB — CBC
HCT: 40.1 % (ref 39.0–52.0)
Hemoglobin: 14.1 g/dL (ref 13.0–17.0)
MCH: 27.9 pg (ref 26.0–34.0)
MCHC: 35.2 g/dL (ref 30.0–36.0)
MCV: 79.4 fL (ref 78.0–100.0)

## 2011-11-04 LAB — POCT I-STAT 3, ART BLOOD GAS (G3+)
TCO2: 25 mmol/L (ref 0–100)
pCO2 arterial: 38.6 mmHg (ref 35.0–45.0)
pH, Arterial: 7.391 (ref 7.350–7.450)

## 2011-11-04 SURGERY — LEFT AND RIGHT HEART CATHETERIZATION WITH CORONARY ANGIOGRAM
Anesthesia: LOCAL

## 2011-11-04 MED ORDER — NITROGLYCERIN 0.2 MG/ML ON CALL CATH LAB
INTRAVENOUS | Status: AC
  Filled 2011-11-04: qty 1

## 2011-11-04 MED ORDER — ACETAMINOPHEN 325 MG PO TABS
650.0000 mg | ORAL_TABLET | ORAL | Status: DC | PRN
  Administered 2011-11-05: 650 mg via ORAL
  Filled 2011-11-04: qty 2

## 2011-11-04 MED ORDER — ENOXAPARIN SODIUM 40 MG/0.4ML ~~LOC~~ SOLN
40.0000 mg | SUBCUTANEOUS | Status: DC
  Administered 2011-11-05: 40 mg via SUBCUTANEOUS
  Filled 2011-11-04 (×2): qty 0.4

## 2011-11-04 MED ORDER — CARVEDILOL 6.25 MG PO TABS
6.2500 mg | ORAL_TABLET | Freq: Two times a day (BID) | ORAL | Status: DC
  Administered 2011-11-04 – 2011-11-06 (×4): 6.25 mg via ORAL
  Filled 2011-11-04 (×9): qty 1

## 2011-11-04 MED ORDER — PRASUGREL HCL 10 MG PO TABS
ORAL_TABLET | ORAL | Status: AC
  Filled 2011-11-04: qty 6

## 2011-11-04 MED ORDER — HEPARIN (PORCINE) IN NACL 2-0.9 UNIT/ML-% IJ SOLN
INTRAMUSCULAR | Status: AC
  Filled 2011-11-04: qty 2000

## 2011-11-04 MED ORDER — ISOSORBIDE MONONITRATE ER 60 MG PO TB24
60.0000 mg | ORAL_TABLET | Freq: Every day | ORAL | Status: DC
  Administered 2011-11-04 – 2011-11-06 (×3): 60 mg via ORAL
  Filled 2011-11-04 (×5): qty 1

## 2011-11-04 MED ORDER — GUAIFENESIN-DM 100-10 MG/5ML PO SYRP
15.0000 mL | ORAL_SOLUTION | ORAL | Status: DC | PRN
  Administered 2011-11-04: 15 mL via ORAL
  Filled 2011-11-04: qty 5

## 2011-11-04 MED ORDER — MIDAZOLAM HCL 2 MG/2ML IJ SOLN
INTRAMUSCULAR | Status: AC
  Filled 2011-11-04: qty 2

## 2011-11-04 MED ORDER — HYDRALAZINE HCL 20 MG/ML IJ SOLN
10.0000 mg | INTRAMUSCULAR | Status: DC | PRN
  Administered 2011-11-04: 10 mg via INTRAVENOUS
  Filled 2011-11-04: qty 1

## 2011-11-04 MED ORDER — SODIUM CHLORIDE 0.9 % IV SOLN: INTRAVENOUS | Status: DC

## 2011-11-04 MED ORDER — ONDANSETRON HCL 4 MG/2ML IJ SOLN: 4.0000 mg | Freq: Four times a day (QID) | INTRAMUSCULAR | Status: DC | PRN

## 2011-11-04 MED ORDER — LABETALOL HCL 5 MG/ML IV SOLN
20.0000 mg | INTRAVENOUS | Status: DC | PRN
  Filled 2011-11-04: qty 4

## 2011-11-04 MED ORDER — FENTANYL CITRATE 0.05 MG/ML IJ SOLN
INTRAMUSCULAR | Status: AC
  Filled 2011-11-04: qty 2

## 2011-11-04 MED ORDER — SODIUM CHLORIDE 0.9 % IJ SOLN: 3.0000 mL | INTRAMUSCULAR | Status: DC | PRN

## 2011-11-04 MED ORDER — ASPIRIN 81 MG PO CHEW
81.0000 mg | CHEWABLE_TABLET | Freq: Every day | ORAL | Status: DC
  Administered 2011-11-05 – 2011-11-06 (×2): 81 mg via ORAL
  Filled 2011-11-04 (×3): qty 1

## 2011-11-04 MED ORDER — SODIUM CHLORIDE 0.9 % IV SOLN
0.2500 mg/kg/h | INTRAVENOUS | Status: DC
  Filled 2011-11-04: qty 250

## 2011-11-04 MED ORDER — PRASUGREL HCL 10 MG PO TABS
10.0000 mg | ORAL_TABLET | Freq: Every day | ORAL | Status: DC
  Administered 2011-11-05 – 2011-11-06 (×2): 10 mg via ORAL
  Filled 2011-11-04 (×6): qty 1

## 2011-11-04 MED ORDER — SODIUM CHLORIDE 0.9 % IV SOLN: INTRAVENOUS | Status: AC

## 2011-11-04 NOTE — Interval H&P Note (Signed)
History and Physical Interval Note:   11/04/2011   9:54 AM   [name]  has presented today for surgery, with the diagnosis of Chest Pain  The various methods of treatment have been discussed with the patient and family. After consideration of risks, benefits and other options for treatment, the patient has consented to  Procedure(s): LEFT AND RIGHT HEART CATHETERIZATION WITH CORONARY ANGIOGRAM as a surgical intervention .  The patients' history has been reviewed, patient examined, no change in status, stable for surgery.  I have reviewed the patients' chart and labs.  Questions were answered to the patient's satisfaction.     Glenn Rotunda  MD Date of Initial H&P: 11/04/2011 9:54 AM   History reviewed, patient examined, no change in status, stable for surgery.

## 2011-11-04 NOTE — Consult Note (Signed)
NAME:  [name], DENTREMONT NO.:  [id number]  MEDICAL RECORD NO.:  [id number]  LOCATION:  3707                         FACILITY:  [facility]  PHYSICIAN:  Rollene Rotunda, MD, FACCDATE OF BIRTH:  [birth year]  DATE OF CONSULTATION:  11/03/2011 DATE OF DISCHARGE:                                CONSULTATION   PRIMARY PHYSICIAN:  None.  CARDIOLOGIST:  None.  REASON FOR CONSULTATION:  The patient with chest pain.  HISTORY OF PRESENT ILLNESS:  The patient is a 40 year old gentleman with a history of "fluctuating blood sugars."  However, my review of an outpatient report demonstrates he has had previous diabetes but simply ran out of his medications.  He has also had untreated hypertension.  He came to the emergency room yesterday afternoon after developing chest discomfort.  This happened at rest.  He had a 4/10 heaviness.  It was substernal.  He had not had this before.  There was no radiation to his jaw or to his arms.  He took some aspirin without relief.  In the emergency room, he was not noted to have any acute EKG changes though his EKG was different than distant EKG which showed T-wave inversions. In this EKG, his T-waves anteriorly were upright.  He was hypertensive when he was admitted and treated with IV labetalol.  He had chest discomfort and was treated with morphine.  He was also briefly treated with IV nitroglycerin until his blood pressure came down.  Said his pain resolved quickly thereafter.  He has had no shortness of breath, PND, or orthopnea.  He has had no nausea, vomiting, or diarrhea.  He has had no weight gain or edema.  He is otherwise active and has been bringing on any symptoms.  PAST MEDICAL HISTORY: 1. Hypertension. 2. Diabetes.  PAST SURGICAL HISTORY:  None.  ALLERGIES:  None.  MEDICATIONS:  None.  SOCIAL HISTORY:  Works at a [employer].  He is single.  Lives with a roommate.  He does not have any children.  He has never  smoked cigarettes.  He rarely drinks alcohol.  FAMILY HISTORY:  Mother had bypass in her mid 7s, but is alive.  REVIEW OF SYSTEMS:  As stated in the HPI.  Negative for all other systems.  PHYSICAL EXAMINATION:  GENERAL: Patient is in no distress. VITAL SIGNS: His blood pressure 125/81, heart rate 92 and regular, respiratory rate 18, afebrile. HEENT: EYES: Unremarkable.  Pupils are equal, round, reactive to light. Fundi not visualized, oral mucosa unremarkable. NECK: Thick, no jugular venous distention.  Waveform within normal limits, carotid upstroke brisk and symmetrical.  No bruits.  No thyromegaly. LYMPHATICS:  No cervical, axillary, inguinal adenopathy. LUNGS: Clear to auscultation bilaterally. BACK: No costovertebral angle tenderness. CHEST: Unremarkable. HEART: PMI not displaced or sustained, S1 and S2 within normal limits. No S3, no S4, no clicks, no rubs, no murmurs. ABDOMEN: Obese, positive bowel sounds.  Normal in frequency pitch.  No bruits, no rebound, no guarding, no midline pulsatile mass, no hepatomegaly, no splenomegaly. SKIN: No rashes, no nodules. EXTREMITIES: 2+ pulses throughout.  No edema, no cyanosis, clubbing. NEUROLOGIC: Oriented to person, place, and time, cranial nerves II through XII grossly intact, motor  grossly intact.  EKG: Sinus rhythm, rate 95, axis within normal limits, intervals within normal limits, no acute ST wave changes.  Chest x-ray, no acute disease.  Troponin peak 5.11, CK 365, MB 19.4, WBC 11.9, hemoglobin 16.2, platelets 213, sodium 132, potassium 3.7, BUN 23, creatinine 1.06, glucose 315.  ASSESSMENT AND PLAN: 1. Non-Q-wave myocardial infarction.  Patient has had Non-Q-wave     myocardial infarction.  He will be treated with Lovenox, aspirin,     and beta blocker.  He will need elective cardiac catheterization.     He is currently pain free.  He needs significant risk reduction. 2. Diabetes, per the primary team. 3. Obesity,  he will be educated and given specific instructions on     diet. 4. Hypertension, I agree with initiation of beta-blockers and Ace     inhibitors.  His blood pressure is currently controlled. 5. Although the patient has thyroid testing and a fasting lipid     profile, I would empirically start Lipitor.     Rollene Rotunda, MD, [facility]     JH/MEDQ  D:  11/03/2011  T:  11/03/2011  Job:  [id number]

## 2011-11-04 NOTE — Progress Notes (Signed)
Spoke w pt. He does not have ins or prim care phy. Gave him inform on [facility] and [facility]. Appt for elid on 12-28 at 2:45pm for elid at [facility]. Appt on 12-31-11 w D, r Shaw at [place], [place] [phone]. Pt was agreeable to [facility] and elidg appts.

## 2011-11-04 NOTE — Op Note (Signed)
CARDIAC CATH NOTE  Name: [name] MRN: [id number] DOB: 1971-11-03  Procedure: PTCA and stenting of the proximal LAD  Indication: 40 year old male with NSTEMI, severe LV dysfunction LVEF less than 20%, critical prox LAD stenosis by diagnostic cath earlier today. Considering severity of LV dysfunction, young age, and diffuse distal vessel disease, elected to proceed with PCI of the prox LAD after review with Dr Antoine Poche.  Procedural Details: The right groin was prepped, draped, and anesthetized with 1% lidocaine. The indwelling 5 FR sheath was upsized to a 6 FR sheath using normal sterile technique.  Weight-based bivalirudin was given for anticoagulation. The patient was preloaded with Effient 60 mg. Once a therapeutic ACT was achieved, a 6 [place] XB-LAD 3.5 cm guide catheter was inserted.  A Cougar coronary guidewire was used to cross the lesion.  The lesion was predilated with a 2.5 x 15 balloon.  The lesion was then stented with a 3.0 x 28 mm Promus Element DES.  The stent was postdilated with a 3.5 mm noncompliant balloon to 16 ATM.  Following PCI, there was 0% residual stenosis and TIMI-3 flow. There was diffuse residual downstream LAD stenosis not favorable for PCI. Final angiography confirmed an excellent result. The patient tolerated the procedure well. There were no immediate procedural complications. Femoral hemostasis will be achieved with manual hemostasis. The patient was transferred to the post catheterization recovery area for further monitoring.  Lesion Data: Vessel: LAD (prox) Percent stenosis (pre): 95 TIMI-flow (pre):  3 Stent:  3.0 x 28 Promus DES Percent stenosis (post): 0 TIMI-flow (post): 3  Conclusions: Successful PCI of the proximal LAD using a single DES.  Recommendations: ASA/Effient at least 12 months without interruption. Reassess LV function in 3 months.  Tonny Bollman 11/04/2011, 6:34 PM

## 2011-11-04 NOTE — Op Note (Signed)
         Patient Information       Patient Name  Sex  DOB  SSN    [name], [name]  Male  10-11-1971  [id number]             Op Note signed by Rollene Rotunda, MD at 11/04/11 1110     Author:  Rollene Rotunda, MD  Service:  Cardiac Cath  Author Type:  Physician   Filed:  11/04/11 1110  Note Time:  11/04/11 0955          Cardiac Catheterization Procedure Note  Name: [name]  MRN: [id number]  DOB: September 11, 1971  Procedure: Right Heart Cath, Left Heart Cath, Selective Coronary Angiography, LV angiography  Indication:  Procedural Details: The right groin was prepped, draped, and anesthetized with 1% lidocaine. Using the modified Seldinger technique a 5 French sheath was placed in the right femoral artery and a 7 French sheath was placed in the right femoral vein. A Swan-Ganz catheter was used for the right heart catheterization. Standard protocol was followed for recording of right heart pressures and sampling of oxygen saturations. Fick cardiac output was calculated. Standard Judkins catheters were used for selective coronary angiography and left ventriculography. There were no immediate procedural complications. The patient was transferred to the post catheterization recovery area for further monitoring.  Procedural Findings:  Hemodynamics:  RA 15  RV 67/14  PA 48  PCWP 31/29  LV 134/20  AO 127/102  Oxygen saturations:  PA 61%  AO 95%  Cardiac Output (Fick) 5.24 Cardiac Index (Fick) 1.97  Coronary angiography:  Coronary dominance: right  Left mainstem: Left main normal  Left anterior descending (LAD): The LAD was a diffusely diseased vessel. There is a long focal small lesion with 90% stenosis. There is a long mid 60% to 70% stenosis. There is moderate diffuse apical stenosis as the vessel wraps the apex. The first diagonal is small and free of high-grade disease. Second diagonal is small with ostial subtotal stenosis. There diagonal is small.  Left circumflex (LCx):  The circumflex in the AV groove is relatively free of disease. The first obtuse marginal is small with long ostial 90% stenosis and long mid 90% stenosis. The second diagonal is large with long mid 30% stenosis. There is a small posterior lateral with ostial long 80% stenosis.  Right coronary artery (RCA): The right coronary artery is a dominant vessel. There is 30% stenosis before the PDA. The PDA is small to moderate size with distal 80% stenosis. There were 2 small posterolaterals.  Left ventriculography: Left ventricular systolic function is normal, LVEF is estimated at 20% with global hypokinesis and anterior akinesis., there is no significant mitral regurgitation  Final Conclusions: Severe two-vessel coronary artery disease. Severely reduced ischemic cardiomyopathy with elevated right heart pressures.  Recommendations: I have reviewed the films with Drs. Cooper and [employer]. At this point we plan percutaneous revascularization of the LAD. He needs aggressive risk reduction. Will need aggressive management of his cardiomyopathy.  Rollene Rotunda  11/04/2011, 10:58 AM

## 2011-11-04 NOTE — Progress Notes (Signed)
Utilization Review Completed.  [name] T  11/04/2011 

## 2011-11-04 NOTE — Op Note (Signed)
Cardiac Catheterization Procedure Note  Name: Rielly Corlett MRN: [id number] DOB: 1971/02/17  Procedure: Right Heart Cath, Left Heart Cath, Selective Coronary Angiography, LV angiography  Indication:    Procedural Details: The right groin was prepped, draped, and anesthetized with 1% lidocaine. Using the modified Seldinger technique a 5 French sheath was placed in the right femoral artery and a 7 French sheath was placed in the right femoral vein. A Swan-Ganz catheter was used for the right heart catheterization. Standard protocol was followed for recording of right heart pressures and sampling of oxygen saturations. Fick cardiac output was calculated. Standard Judkins catheters were used for selective coronary angiography and left ventriculography. There were no immediate procedural complications. The patient was transferred to the post catheterization recovery area for further monitoring.  Procedural Findings: Hemodynamics:               RA 15    RV 67/14     PA 48    PCWP 31/29    LV 134/20    AO 127/102   Oxygen saturations:    PA 61%    AO 95%   Cardiac Output (Fick) 5.24                               Cardiac Index (Fick) 1.97    Coronary angiography: Coronary dominance: right  Left mainstem:    Left main normal  Left anterior descending (LAD): The LAD was a diffusely diseased vessel. There is a long focal small lesion with 90% stenosis. There is a long mid 60% to 70% stenosis. There is moderate diffuse apical stenosis as the vessel wraps the apex. The first diagonal is small and free of high-grade disease. Second diagonal is small with ostial subtotal stenosis. There diagonal is small. Left circumflex (LCx): The circumflex in the AV groove is relatively free of disease.  The first obtuse marginal is small with long ostial 90% stenosis and long mid 90% stenosis. The second diagonal is large with long mid 30% stenosis. There is a small posterior lateral with ostial long 80%  stenosis.  Right coronary artery (RCA): The right coronary artery is a dominant vessel.  There is 30% stenosis before the PDA. The PDA is small to moderate size with distal 80% stenosis. There were 2 small posterolaterals.  Left ventriculography: Left ventricular systolic function is normal, LVEF is estimated at 20% with global hypokinesis and anterior akinesis., there is no significant mitral regurgitation   Final Conclusions:  Severe two-vessel coronary artery disease. Severely reduced ischemic cardiomyopathy with elevated right heart pressures. Recommendations: I have reviewed the films with Drs. Cooper and [employer].  At this point we plan percutaneous revascularization of the LAD. He needs aggressive risk reduction. Will need aggressive management of his cardiomyopathy.   Rollene Rotunda 11/04/2011, 10:58 AM

## 2011-11-04 NOTE — Progress Notes (Signed)
Inpatient Diabetes Program Recommendations  AACE/ADA: New Consensus Statement on Inpatient Glycemic Control (2009)  Target Ranges:  Prepandial:   less than 140 mg/dL      Peak postprandial:   less than 180 mg/dL (1-2 hours)      Critically ill patients:  140 - 180 mg/dL   Reason for Visit: Elevated glucose:  200s  Unsure of history of Diabetes.  Currently on no home medications for Diabetes.  Inpatient Diabetes Program Recommendations Insulin - Basal: Add Lantus 25 units daily Correction (SSI): Increase to Moderate Novolog Correction HgbA1C: To assess glycemic control

## 2011-11-04 NOTE — Op Note (Signed)
CARDIAC CATH NOTE  Name: [name] MRN: [id number] DOB: June 12, 1971  Procedure: PTCA and stenting of the proximal LAD  Indication: 40 year old male with NSTEMI, severe LV dysfunction LVEF less than 20%, critical prox LAD stenosis by diagnostic cath earlier today. Considering severity of LV dysfunction, young age, and diffuse distal vessel disease, elected to proceed with PCI of the prox LAD after review with Dr Antoine Poche.  Procedural Details: The right groin was prepped, draped, and anesthetized with 1% lidocaine. The indwelling 5 FR sheath was upsized to a 6 FR sheath using normal sterile technique.  Weight-based bivalirudin was given for anticoagulation. The patient was preloaded with Effient 60 mg. Once a therapeutic ACT was achieved, a 6 [place] XB-LAD 3.5 cm guide catheter was inserted.  A Cougar coronary guidewire was used to cross the lesion.  The lesion was predilated with a 2.5 x 15 balloon.  The lesion was then stented with a 3.0 x 28 mm Promus Element DES.  The stent was postdilated with a 3.5 mm noncompliant balloon to 16 ATM.  Following PCI, there was 0% residual stenosis and TIMI-3 flow. There was diffuse residual downstream LAD stenosis not favorable for PCI. Final angiography confirmed an excellent result. The patient tolerated the procedure well. There were no immediate procedural complications. Femoral hemostasis will be achieved with manual hemostasis. The patient was transferred to the post catheterization recovery area for further monitoring.  Lesion Data: Vessel: LAD (prox) Percent stenosis (pre): 95 TIMI-flow (pre):  3 Stent:  3.0 x 28 Promus DES Percent stenosis (post): 0 TIMI-flow (post): 3  Conclusions: Successful PCI of the proximal LAD using a single DES.  Recommendations: ASA/Effient at least 12 months without interruption. Reassess LV function in 3 months.  Tonny Bollman 11/04/2011, 1:11 PM

## 2011-11-04 NOTE — Interval H&P Note (Signed)
History and Physical Interval Note:   11/04/2011   12:10 PM   [name]  has presented today for surgery, with the diagnosis of Chest Pain  The various methods of treatment have been discussed with the patient and family. After consideration of risks, benefits and other options for treatment, the patient has consented to  Procedure(s): LEFT AND RIGHT HEART CATHETERIZATION WITH CORONARY ANGIOGRAM as a surgical intervention .  The patients' history has been reviewed, patient examined, no change in status, stable for surgery.  I have reviewed the patients' chart and labs.  Questions were answered to the patient's satisfaction.     Tonny Bollman  MD  Date of Initial H&P: 11/02/11  History reviewed, patient examined, no change in status, stable for surgery.

## 2011-11-04 NOTE — Progress Notes (Signed)
Subjective: Patient seen status post catheterization. He is tired. He denies any chest pain or shortness of breath.  Objective: Weight change:   Intake/Output Summary (Last 24 hours) at 11/04/11 1831 Last data filed at 11/04/11 1800  Gross per 24 hour  Intake 850.04 ml  Output      0 ml  Net 850.04 ml   BP 135/93  Pulse 109  Temp(Src) 97.6 F (36.4 C) (Oral)  Resp 13  Ht 6\' 6"  (1.981 m)  Wt 133.811 kg (295 lb)  BMI 34.09 kg/m2  SpO2 100% General appearance: alert, cooperative, appears stated age and no distress Lungs: wheezes Throughout, and expiratory Heart: regular rate and rhythm, S1, S2 normal and 2/6 systolic ejection murmur Abdomen: soft, non-tender; bowel sounds normal; no masses,  no organomegaly Extremities: No clubbing or cyanosis. Trace pitting edema. Status post femoral catheter removal  Lab Results: Basic Metabolic Panel:  Basename 11/04/11 1620 11/03/11 1428 11/03/11 0630  NA -- 135 134*  K -- 3.8 3.8  CL -- 97 98  CO2 -- 28 25  GLUCOSE -- 263* 301*  BUN -- 21 21  CREATININE 0.86 1.08 --  CALCIUM -- 9.1 9.1  MG -- -- 1.7  PHOS -- -- --   Liver Function Tests:  [facility] 11/03/11 0630 11/02/11 1232  AST 39* 18  ALT 24 23  ALKPHOS 62 72  BILITOT 0.6 0.5  PROT 6.7 7.2  ALBUMIN 3.4* 3.8   CBC:  Basename 11/04/11 1620 11/03/11 1428 11/03/11 0630 11/02/11 1232  WBC 10.1 11.6* -- --  NEUTROABS -- -- 7.5 7.9*  HGB 14.1 14.6 -- --  HCT 40.1 41.1 -- --  MCV 79.4 80.6 -- --  PLT 198 222 -- --   Cardiac Enzymes:  Basename 11/03/11 0530 11/03/11 0008 11/02/11 1708  CKTOTAL 380* 365* 278*  CKMB 19.5* 19.4* 12.3*  CKMBINDEX -- -- --  TROPONINI 6.13* 5.11* 2.93*   CBG:  Basename 11/04/11 1744 11/04/11 1147 11/04/11 0740 11/04/11 0415 11/04/11 0032 11/03/11 2133  GLUCAP 158* 205* 229* 239* 284* 220*   Hemoglobin A1C:  Basename 11/02/11 1951  HGBA1C 13.0*   Fasting Lipid Panel:  Basename 11/04/11 0745  CHOL 233*  HDL 33*  LDLCALC 137*    TRIG 316*  CHOLHDL 7.1  LDLDIRECT --   Thyroid Function Tests:  Basename 11/02/11 1951 11/02/11 1708  TSH 2.189 --  T4TOTAL -- 7.7  FREET4 1.08 --  T3FREE 2.8 --  THYROIDAB -- --   Anemia Panel: No results found for this basename: VITAMINB12,FOLATE,FERRITIN,TIBC,IRON,RETICCTPCT in the last 72 hours Coagulation:  Basename 11/03/11 0630  INR 1.01     Medications: Scheduled Meds:   . aspirin  324 mg Oral Pre-Cath  . aspirin  81 mg Oral Daily  . carvedilol  6.25 mg Oral BID WC  . enoxaparin  40 mg Subcutaneous Q24H  . fentaNYL      . fentaNYL      . heparin      . heparin      . insulin aspart  0-9 Units Subcutaneous Q4H  . isosorbide mononitrate  60 mg Oral Daily  . lisinopril  20 mg Oral Daily  . midazolam      . midazolam      . nitroGLYCERIN      . nitroGLYCERIN      . pantoprazole  40 mg Oral Q1200  . prasugrel  10 mg Oral Daily  . rosuvastatin  40 mg Oral Q24H  . sodium chloride  3 mL Intravenous Q12H  . DISCONTD: aspirin EC  325 mg Oral Daily  . DISCONTD: enoxaparin (LOVENOX) injection  130 mg Subcutaneous Q12H  . DISCONTD: metoprolol tartrate  50 mg Oral BID  . DISCONTD: nitroGLYCERIN  1 inch Topical Q6H  . DISCONTD: prasugrel       Continuous Infusions:   . sodium chloride 250 mL (11/03/11 1415)  . sodium chloride 1 mL/kg/hr (11/04/11 0421)  . sodium chloride    . bivalirudin (ANGIOMAX) infusion 5 mg/mL (Cath Lab,ACS,PCI indication)    . DISCONTD: sodium chloride     PRN Meds:.acetaminophen, hydrALAZINE, labetalol, levalbuterol, morphine injection, nitroGLYCERIN, ondansetron (ZOFRAN) IV, sodium chloride, DISCONTD: acetaminophen  Assessment/Plan: Patient Active Hospital Problem List: MI, acute, non ST segment elevation (11/03/2011)   Assessment: Status post cardiac catheterization. Patient found to have severe double vessel disease. Status post PTCA and stent placement    Ischemic cardiomyopathy (11/04/2011)  severely decreased ejection fraction.  We'll check BNP   Acute systolic congestive heart failure (11/04/2011)  as above, will provide education  Diabetes mellitus type 2, uncontrolled (12/14/2010)  needs aggressive control. Noted hemoglobin A1c. Close outpatient followup and education.  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 Will need close outpatient followup with cardiology and health service clinic    LOS: 2 days   Angeletta Goelz K 11/04/2011, 6:31 PM

## 2011-11-04 NOTE — Progress Notes (Signed)
SUBJECTIVE:  No further chest pain.  No SOB.  Denies NV   Filed Vitals:   11/03/11 0500 11/03/11 1300 11/03/11 2148 11/04/11 0530  BP: 147/109 110/70 124/74 128/85  Pulse: 105 100 96 102  Temp: 98.6 F (37 C) 97.7 F (36.5 C) 98.7 F (37.1 C) 98 F (36.7 C)  TempSrc: Oral Oral Oral Oral  Resp: 16 16 14 18   Height:      Weight:      SpO2: 97% 98% 98% 99%    Intake/Output Summary (Last 24 hours) at 11/04/11 0907 Last data filed at 11/04/11 1610  Gross per 24 hour  Intake 890.04 ml  Output      0 ml  Net 890.04 ml    LABS: Lab Results  Component Value Date   WBC 11.6* 11/03/2011   HGB 14.6 11/03/2011   HCT 41.1 11/03/2011   PLT 222 11/03/2011   GLUCOSE 263* 11/03/2011   CHOL  Value: 166        ATP III CLASSIFICATION:  <200     mg/dL   Desirable  960-454  mg/dL   Borderline High  >=098    mg/dL   High        11/91/4782   TRIG 117 12/11/2010   HDL 30* 12/11/2010   LDLCALC  Value: 113        Total Cholesterol/HDL:CHD Risk Coronary Heart Disease Risk Table                     Men   Women  1/2 Average Risk   3.4   3.3  Average Risk       5.0   4.4  2 X Average Risk   9.6   7.1  3 X Average Risk  23.4   11.0        Use the calculated Patient Ratio above and the CHD Risk Table to determine the patient's CHD Risk.        ATP III CLASSIFICATION (LDL):  <100     mg/dL   Optimal  956-213  mg/dL   Near or Above                    Optimal  130-159  mg/dL   Borderline  086-578  mg/dL   High  >469     mg/dL   Very High* 62/95/2841   ALT 24 11/03/2011   AST 39* 11/03/2011   NA 135 11/03/2011   K 3.8 11/03/2011   CL 97 11/03/2011   CREATININE 1.08 11/03/2011   BUN 21 11/03/2011   CO2 28 11/03/2011   TSH 2.189 11/02/2011   INR 1.01 11/03/2011   HGBA1C 13.0* 11/02/2011   Lab Results  Component Value Date   INR 1.01 11/03/2011   INR 1.07 12/10/2010   Lab Results  Component Value Date   CKTOTAL 380* 11/03/2011   CKMB 19.5* 11/03/2011   TROPONINI 6.13* 11/03/2011    Lab Results  Component Value  Date   TSH 2.189 11/02/2011   Lab Results  Component Value Date   CHOL  Value: 166        ATP III CLASSIFICATION:  <200     mg/dL   Desirable  324-401  mg/dL   Borderline High  >=027    mg/dL   High        25/36/6440   HDL 30* 12/11/2010   LDLCALC  Value: 113        Total Cholesterol/HDL:CHD  Risk Coronary Heart Disease Risk Table                     Men   Women  1/2 Average Risk   3.4   3.3  Average Risk       5.0   4.4  2 X Average Risk   9.6   7.1  3 X Average Risk  23.4   11.0        Use the calculated Patient Ratio above and the CHD Risk Table to determine the patient's CHD Risk.        ATP III CLASSIFICATION (LDL):  <100     mg/dL   Optimal  161-096  mg/dL   Near or Above                    Optimal  130-159  mg/dL   Borderline  045-409  mg/dL   High  >811     mg/dL   Very High* 91/47/8295   TRIG 117 12/11/2010   CHOLHDL 5.5 12/11/2010     PHYSICAL EXAM General:  No distress Lungs:   clear Heart:  No murmur   Abdomen:  Positive BS Extremities:  No edema   TELEMETRY: Reviewed telemetry pt in NSR  ASSESSMENT AND PLAN:  NQWMI:   The patient will have a cardiac cath today.  Further treatment will be based on these results  Diabetes:  Per the primary team  Obesity:  Pt is being educated  Cardiomyopathy:  Severe, EF 15%.  Right and left heart cath today.   Hyperlipidemia:  Patient is newly on Crestor

## 2011-11-05 ENCOUNTER — Other Ambulatory Visit: Payer: Self-pay

## 2011-11-05 DIAGNOSIS — I5021 Acute systolic (congestive) heart failure: Secondary | ICD-10-CM

## 2011-11-05 LAB — CBC
HCT: 40.6 % (ref 39.0–52.0)
Hemoglobin: 14.1 g/dL (ref 13.0–17.0)
MCH: 28 pg (ref 26.0–34.0)
MCHC: 34.7 g/dL (ref 30.0–36.0)

## 2011-11-05 LAB — BASIC METABOLIC PANEL
BUN: 17 mg/dL (ref 6–23)
Calcium: 9.3 mg/dL (ref 8.4–10.5)
Creatinine, Ser: 1 mg/dL (ref 0.50–1.35)
GFR calc non Af Amer: >90 mL/min (ref >90)
Glucose, Bld: 217 mg/dL — ABNORMAL HIGH (ref 70–99)
Sodium: 137 mEq/L (ref 135–145)

## 2011-11-05 LAB — GLUCOSE, CAPILLARY
Glucose-Capillary: 212 mg/dL — ABNORMAL HIGH (ref 70–99)
Glucose-Capillary: 146 mg/dL — ABNORMAL HIGH (ref 70–99)

## 2011-11-05 MED ORDER — INSULIN ASPART 100 UNIT/ML ~~LOC~~ SOLN
0.0000 [IU] | Freq: Three times a day (TID) | SUBCUTANEOUS | Status: DC
  Administered 2011-11-05 (×2): 3 [IU] via SUBCUTANEOUS

## 2011-11-05 MED ORDER — GLIPIZIDE 10 MG PO TABS
10.0000 mg | ORAL_TABLET | Freq: Two times a day (BID) | ORAL | Status: DC
  Administered 2011-11-05 – 2011-11-06 (×2): 10 mg via ORAL
  Filled 2011-11-05 (×4): qty 1

## 2011-11-05 MED ORDER — FUROSEMIDE 40 MG PO TABS
40.0000 mg | ORAL_TABLET | Freq: Two times a day (BID) | ORAL | Status: DC
  Administered 2011-11-05 – 2011-11-06 (×2): 40 mg via ORAL
  Filled 2011-11-05 (×4): qty 1

## 2011-11-05 MED ORDER — LIVING WELL WITH DIABETES BOOK
Freq: Once | Status: AC
  Administered 2011-11-06: 07:00:00
  Filled 2011-11-05 (×2): qty 1

## 2011-11-05 MED ORDER — MORPHINE SULFATE 4 MG/ML IJ SOLN: 2.0000 mg | INTRAMUSCULAR | Status: DC | PRN

## 2011-11-05 MED ORDER — BD GETTING STARTED TAKE HOME KIT: 1/2ML X 30G SYRINGES
1.0000 | Freq: Once | Status: DC
  Filled 2011-11-05 (×2): qty 1

## 2011-11-05 NOTE — Progress Notes (Signed)
Dr Antoine Poche to see in office. Effient card given to pt for 30days of ree med-left sticky not for md that will need prescription for 30days to get this free, also pt assist form in room for md to sign to see if pt can get thru drug company. Appt w [facility] sched.

## 2011-11-05 NOTE — Progress Notes (Signed)
SUBJECTIVE:  No chest pain.  No SOB   PHYSICAL EXAM Filed Vitals:   11/04/11 2000 11/05/11 0030 11/05/11 0500 11/05/11 0700  BP: 136/84 113/63 142/94   Pulse:  97 95   Temp: 97.9 F (36.6 C) 97.8 F (36.6 C) 97.6 F (36.4 C)   TempSrc: Oral Oral Oral   Resp: 15  18   Height:      Weight:    130.863 kg (288 lb 8 oz)  SpO2: 99% 99% 99%    General:  No distress  Lungs:  Clear Heart:  RRR, no murmurs Abdomen:  No rebound, no guarding Extremities:  No edema, right groin OK  LABS: Lab Results  Component Value Date   BUN 17 11/05/2011  , Lab Results  Component Value Date   CREATININE 1.00 11/05/2011  , Lab Results  Component Value Date   NA 137 11/05/2011   K 3.8 11/05/2011   CL 100 11/05/2011   CO2 27 11/05/2011   Lab Results  Component Value Date   WBC 9.9 11/05/2011   HGB 14.1 11/05/2011   HCT 40.6 11/05/2011   MCV 80.6 11/05/2011   PLT 199 11/05/2011   Lab Results  Component Value Date   CKTOTAL 380* 11/03/2011   CKMB 19.5* 11/03/2011   TROPONINI 6.13* 11/03/2011     Intake/Output Summary (Last 24 hours) at 11/05/11 0739 Last data filed at 11/04/11 1837  Gross per 24 hour  Intake    200 ml  Output   1000 ml  Net   -800 ml    ASSESSMENT AND PLAN:  Principal Problem:  *MI, acute, non ST segment elevation Active Problems:  Diabetes mellitus type 2, uncontrolled  HYPERLIPIDEMIA  HYPERTENSION, CHRONIC VENOUS, WITH COMPLICATIONS  Ischemic cardiomyopathy  Acute systolic congestive heart failure   CAD:  S/P PCI.  Patient understands the need for med compliance.  Cardiac rehab to work with the patient today.  CHF:  Started on ACE inhibitor and beta blocker this admit.  I will titrate over time  Diabetes:  He needs a primary care appt to be arranged before discharge  Obesity:  Educate  Rollene Rotunda 11/05/2011 7:39 AM

## 2011-11-05 NOTE — Progress Notes (Signed)
Inpatient Diabetes Program Recommendations  AACE/ADA: New Consensus Statement on Inpatient Glycemic Control (2009)  Target Ranges:  Prepandial:   less than 140 mg/dL      Peak postprandial:   less than 180 mg/dL (1-2 hours)      Critically ill patients:  140 - 180 mg/dL   Reason for Visit: CBG's greater than 200 mg/dL  Inpatient Diabetes Program Recommendations Insulin - Basal: Consider adding NPH 10 units at HS Correction (SSI): Increase to Moderate Novolog Correction Oral Agents: Add Metformin 500 mg bid with meals (need to wait 48 hrs. after contrast dye) HgbA1C: Note A1C=13.0% indicating average CBG is 326 mg/dL Outpatient Referral: referral received for Outpatient diabetes education-will fax at discharge  Note: Patients A1C indicates that glycemic control is sub-optimal and will likely need insulin.  Patient is to follow-up with [facility], however eligibility appt. Is not until December.  Briefly reviewed meaning of A1c with patient and goal A1c.  He states that he has been on metformin in past but went off of it because blood sugars improved. Will order inpatient diabetes education for bedside RN to complete survival skill education prior to discharge.  Also instructed patient to buy generic glucose meter/strips for home use after discharge.

## 2011-11-05 NOTE — Progress Notes (Signed)
Subjective: Patient doing okay. He denies any chest pain or shortness of breath. He otherwise is doing well, and tolerating his medications. He has had no complications his cardiac catheterization the day prior. No other complaints.  Objective: Weight change:   Intake/Output Summary (Last 24 hours) at 11/05/11 1419 Last data filed at 11/05/11 1016  Gross per 24 hour  Intake    680 ml  Output   1000 ml  Net   -320 ml   BP 147/99  Pulse 99  Temp(Src) 97.7 F (36.5 C) (Oral)  Resp 18  Ht 6\' 6"  (1.981 m)  Wt 130.863 kg (288 lb 8 oz)  BMI 33.34 kg/m2  SpO2 99% General appearance: alert, cooperative, no distress and uncooperative Cardiovascular: Regular rate and rhythm, S1-S2, 2/6 systolic ejection murmur. Lungs: clear to auscultation bilaterally Abdomen: soft, non-tender; bowel sounds normal; no masses,  no organomegaly Extremities: Trace pitting edema  Lab Results: Basic Metabolic Panel:  Basename 11/05/11 0431 11/04/11 1620 11/03/11 1428 11/03/11 0630  NA 137 -- 135 --  K 3.8 -- 3.8 --  CL 100 -- 97 --  CO2 27 -- 28 --  GLUCOSE 217* -- 263* --  BUN 17 -- 21 --  CREATININE 1.00 0.86 -- --  CALCIUM 9.3 -- 9.1 --  MG -- -- -- 1.7  PHOS -- -- -- --  CBC:  Basename 11/05/11 0431 11/04/11 1620 11/03/11 0630  WBC 9.9 10.1 --  NEUTROABS -- -- 7.5  HGB 14.1 14.1 --  HCT 40.6 40.1 --  MCV 80.6 79.4 --  PLT 199 198 --   BNP:  Basename 11/04/11 2213  POCBNP 1306.0*   CBG:  Basename 11/05/11 1225 11/04/11 2245 11/04/11 1744 11/04/11 1147 11/04/11 0740 11/04/11 0415  GLUCAP 220* 225* 158* 205* 229* 239*   Fasting Lipid Panel:  Basename 11/04/11 0745  CHOL 233*  HDL 33*  LDLCALC 137*  TRIG 316*  CHOLHDL 7.1  LDLDIRECT --  Medications: Scheduled Meds:   . aspirin  81 mg Oral Daily  . carvedilol  6.25 mg Oral BID WC  . enoxaparin  40 mg Subcutaneous Q24H  . fentaNYL      . fentaNYL      . furosemide  40 mg Oral BID  . glipiZIDE  10 mg Oral BID AC  .  heparin      . heparin      . insulin aspart  0-9 Units Subcutaneous TID WC  . isosorbide mononitrate  60 mg Oral Daily  . lisinopril  20 mg Oral Daily  . midazolam      . midazolam      . nitroGLYCERIN      . nitroGLYCERIN      . pantoprazole  40 mg Oral Q1200  . prasugrel  10 mg Oral Daily  . rosuvastatin  40 mg Oral Q24H  . sodium chloride  3 mL Intravenous Q12H  . DISCONTD: insulin aspart  0-9 Units Subcutaneous Q4H  . DISCONTD: nitroGLYCERIN  1 inch Topical Q6H   Continuous Infusions:   . sodium chloride 250 mL (11/03/11 1415)  . sodium chloride 1 mL/kg/hr (11/04/11 0421)  . sodium chloride    . bivalirudin (ANGIOMAX) infusion 5 mg/mL (Cath Lab,ACS,PCI indication)     PRN Meds:.acetaminophen, guaiFENesin-dextromethorphan, hydrALAZINE, labetalol, levalbuterol, morphine, nitroGLYCERIN, ondansetron (ZOFRAN) IV, sodium chloride, DISCONTD:  morphine injection  Assessment/Plan: Patient Active Hospital Problem List: MI, acute, non ST segment elevation (11/03/2011)   Assessment: Status post cardiac catheterization and stent placement. Plans  will be for close cardiology followup as outpatient. Cardiology is arranging for Effient through drug sample plan     Ischemic cardiomyopathy (11/04/2011)   Assessment: As above    Acute systolic congestive heart failure (11/04/2011)   Assessment: Started by mouth Lasix plus strict I.'s and O.'s and daily weights     Diabetes mellitus type 2, uncontrolled (12/14/2010)   Assessment: Will try glipizide 10 by mouth twice a day in hopes to ensure compliance. No metformin as given heart history     HYPERLIPIDEMIA (12/14/2010)   Assessment: Crestor started.     HYPERTENSION, CHRONIC VENOUS, WITH COMPLICATIONS medication started  We'll try to work to arrange close endocrinology followup as outpatient soon. Earliest helps her appointment is in 2 months. Plan for likely discharge tomorrow.   LOS: 3 days   Vitalia Stough K 11/05/2011, 2:19  PM

## 2011-11-05 NOTE — Progress Notes (Addendum)
CARDIAC REHAB PHASE I   PRE:  Rate/Rhythm: 102 ST  BP:  Supine: 147/99  Sitting:   Standing:    SaO2:   MODE:  Ambulation: 680 ft   POST:  Rate/Rhythem: 114 ST  BP:  Supine:   Sitting: 148/101  Standing:    SaO2:  Pt. tolerated walk well without c/o.Completed discharge education with pt. He voices understanding. Pt. agrees to Outpt. CRP in [place], will, send referral. [id number] Beatrix Fetters

## 2011-11-06 LAB — CBC
MCV: 80.4 fL (ref 78.0–100.0)
Platelets: 209 10*3/uL (ref 150–400)
RBC: 5.05 MIL/uL (ref 4.22–5.81)
WBC: 10.9 10*3/uL — ABNORMAL HIGH (ref 4.0–10.5)

## 2011-11-06 LAB — BASIC METABOLIC PANEL
CO2: 27 mEq/L (ref 19–32)
Chloride: 103 mEq/L (ref 96–112)
Creatinine, Ser: 1.19 mg/dL (ref 0.50–1.35)

## 2011-11-06 LAB — GLUCOSE, CAPILLARY
Glucose-Capillary: 127 mg/dL — ABNORMAL HIGH (ref 70–99)
Glucose-Capillary: 95 mg/dL (ref 70–99)

## 2011-11-06 MED ORDER — CARVEDILOL 6.25 MG PO TABS: 6.2500 mg | ORAL_TABLET | Freq: Two times a day (BID) | ORAL | Status: DC

## 2011-11-06 MED ORDER — PRASUGREL HCL 10 MG PO TABS: 10.0000 mg | ORAL_TABLET | Freq: Every day | ORAL | Status: DC

## 2011-11-06 MED ORDER — GLIPIZIDE 10 MG PO TABS: 10.0000 mg | ORAL_TABLET | Freq: Two times a day (BID) | ORAL | Status: DC

## 2011-11-06 MED ORDER — FUROSEMIDE 40 MG PO TABS: 40.0000 mg | ORAL_TABLET | Freq: Every day | ORAL | Status: DC

## 2011-11-06 MED ORDER — ISOSORBIDE MONONITRATE ER 60 MG PO TB24: 60.0000 mg | ORAL_TABLET | Freq: Every day | ORAL | Status: DC

## 2011-11-06 MED ORDER — ASPIRIN 81 MG PO CHEW: 81.0000 mg | CHEWABLE_TABLET | Freq: Every day | ORAL | Status: AC

## 2011-11-06 MED ORDER — LISINOPRIL 20 MG PO TABS: 20.0000 mg | ORAL_TABLET | Freq: Every day | ORAL | Status: DC

## 2011-11-06 MED ORDER — POTASSIUM CHLORIDE ER 10 MEQ PO TBCR: 20.0000 meq | EXTENDED_RELEASE_TABLET | Freq: Every day | ORAL | Status: DC

## 2011-11-06 MED ORDER — PRAVASTATIN SODIUM 40 MG PO TABS: 40.0000 mg | ORAL_TABLET | Freq: Every day | ORAL | Status: DC

## 2011-11-06 MED ORDER — NITROGLYCERIN 0.4 MG SL SUBL: 0.4000 mg | SUBLINGUAL_TABLET | SUBLINGUAL | Status: DC | PRN

## 2011-11-06 NOTE — Plan of Care (Signed)
Problem: Discharge Progression Outcomes Goal: Barriers To Progression Addressed/Resolved Outcome: Progressing Newly diagnosed diabetic

## 2011-11-06 NOTE — Progress Notes (Signed)
Pt given d/c instructions with understanding.  Educated pt to continue to read educational books about CHF, DM, and MI.  Also instructed about post cath care and limitations. Pt has noted understanding.  No distress noted at discharged. [name]

## 2011-11-06 NOTE — Progress Notes (Signed)
SUBJECTIVE:  No chest pain.  No SOB.  Ready to go home.   PHYSICAL EXAM Filed Vitals:   11/05/11 0817 11/05/11 1625 11/05/11 2100 11/06/11 0642  BP: 147/99 147/84 113/66 136/84  Pulse: 99 98 87 94  Temp: 97.7 F (36.5 C) 97.9 F (36.6 C) 97.8 F (36.6 C) 97.4 F (36.3 C)  TempSrc: Oral Oral Oral Oral  Resp: 18 20 18 20   Height:  6\' 6"  (1.981 m)    Weight:      SpO2: 99% 98% 97% 100%   General:  No distress  Lungs:  Clear Heart:  RRR, no murmurs Abdomen:  No rebound, no guarding Extremities:  No edema.  LABS: Lab Results  Component Value Date   BUN 21 11/06/2011  ,  Lab Results  Component Value Date   CREATININE 1.19 11/06/2011  , Lab Results  Component Value Date   NA 141 11/06/2011   K 3.6 11/06/2011   CL 103 11/06/2011   CO2 27 11/06/2011   Lab Results  Component Value Date   WBC 10.9* 11/06/2011   HGB 14.0 11/06/2011   HCT 40.6 11/06/2011   MCV 80.4 11/06/2011   PLT 209 11/06/2011      Intake/Output Summary (Last 24 hours) at 11/06/11 0747 Last data filed at 11/06/11 0600  Gross per 24 hour  Intake   1000 ml  Output      0 ml  Net   1000 ml    ASSESSMENT AND PLAN:  Principal Problem:  *MI, acute, non ST segment elevation Active Problems:  Diabetes mellitus type 2, uncontrolled  HYPERLIPIDEMIA  HYPERTENSION, CHRONIC VENOUS, WITH COMPLICATIONS  Ischemic cardiomyopathy  Acute systolic congestive heart failure   CAD:  S/P PCI.  Patient understands the need for med compliance.  Cardiac rehab worked with the patient yesterday.  CHF:  Started on ACE inhibitor and beta blocker this admit.  I will titrate over time as an outpatient.  Diabetes:  He needs a primary care appt to be arranged before discharge  Obesity:  Educate  OK to DC from my standpoint.  We will arrange follow up in my office with our PA in 10 days or less.  Fayrene Fearing [facility] 11/06/2011 7:47 AM

## 2011-11-06 NOTE — Discharge Summary (Signed)
DISCHARGE SUMMARY  [name]  MR#: [id number]  DOB:[birth year]  Date of Admission: 11/02/2011 Date of Discharge: 11/06/2011  Attending Physician:Kerri-Anne Haeberle K  Patient's PCP:DE LA CRUZ,IVY, [facility]   Consults:Treatment Team:  Rollene Rotunda, MD, cardiology  Discharge Diagnoses: Present on Admission:  .MI, acute, non ST segment elevation .Diabetes mellitus type 2, uncontrolled .HYPERTENSION, CHRONIC VENOUS, WITH COMPLICATIONS .HYPERLIPIDEMIA .Ischemic cardiomyopathy .Acute systolic congestive heart failure .Obesity, unspecified    Current Discharge Medication List    START taking these medications   Details  aspirin 81 MG chewable tablet Chew 1 tablet (81 mg total) by mouth daily. Qty: 30 tablet, Refills: 1    carvedilol (COREG) 6.25 MG tablet Take 1 tablet (6.25 mg total) by mouth 2 (two) times daily with a meal. Qty: 60 tablet, Refills: 1    furosemide (LASIX) 40 MG tablet Take 1 tablet (40 mg total) by mouth daily. Qty: 30 tablet, Refills: 1    glipiZIDE (GLUCOTROL) 10 MG tablet Take 1 tablet (10 mg total) by mouth 2 (two) times daily before a meal. Qty: 60 tablet, Refills: 1    isosorbide mononitrate (IMDUR) 60 MG 24 hr tablet Take 1 tablet (60 mg total) by mouth daily. Qty: 60 tablet, Refills: 0    lisinopril (PRINIVIL,ZESTRIL) 20 MG tablet Take 1 tablet (20 mg total) by mouth daily. Qty: 30 tablet, Refills: 1    nitroGLYCERIN (NITROSTAT) 0.4 MG SL tablet Place 1 tablet (0.4 mg total) under the tongue every 5 (five) minutes x 3 doses as needed for chest pain. Qty: 20 tablet, Refills: 1    potassium chloride (K-DUR) 10 MEQ tablet Take 2 tablets (20 mEq total) by mouth daily. Qty: 30 tablet, Refills: 1    prasugrel (EFFIENT) 10 MG TABS Take 1 tablet (10 mg total) by mouth daily. Qty: 30 tablet, Refills: 3    pravastatin (PRAVACHOL) 40 MG tablet Take 1 tablet (40 mg total) by mouth daily. Qty: 30 tablet, Refills: 1           Hospital Course: Present on Admission:  .MI, acute, non ST segment elevation: [name] is a 40 year old white male with past medical history of hypertension and diabetes who was not following with a doctor and was trying to manage these conditions with diet only who presented to the emergency room on 11/02/2011 with complaints of chest pain. He was admitted to the hospitalist service for rule out MI, when his second set of cardiac markers noted significantly elevated troponin of greater than 5. [facility] was consult in and the patient was started on heparin and nitro paste. He was actually chest pain free during this time. Patient was taken for cardiac catheterization on 11 5 by Dr. Tonny Bollman of the [facility] cardiology.  Patient started off with a diagnostic cardiac catheterization noting critical proximal LAD stenosis. He was also noted to have severe left ventricular dysfunction with an ejection fraction less than 20%. Patient had a successful PCI of the proximal LAD placed using a single DES.  Post catheter the patient did well with no complications. After discussion with cardiology, obviously the patient will need very close medical followup. He has no insurance. Cardiology plan see the patient in the next 10 days. [facility] is working with [employer] to make sure that the patient has Effient available. In addition he has been given a number of antihypertensive medications plus aspirin plus statin all of which are on the [employer] four dollar plan so that patient can afford his  medicines and be more compliant.  .Diabetes mellitus type 2, uncontrolled: From a diabetic standpoint, patient is uncontrolled. His hemoglobin A1c returned back at 13. Because of the concerns for compliance as well as affordability, we'll try to start the patient off on oral medications. These will include glipizide 10 mg by mouth twice a day. Patient CBGs have ranged from the 90s to the  160s since glipizide has been initiated. Outpatient referral for diabetes education has been provided. He'll start clinic will manage the patient's diabetes. Unfortunately they're not able to see the patient for the next 2 months. Awaiting a return phone call from [facility] to see if they can help manage the patient until he is able to be seen by [facility].   Marland KitchenHYPERTENSION, CHRONIC VENOUS, WITH COMPLICATIONS: As above, patient is being discharged on Coreg, ACE inhibitor, Imdur were and diuretic.  Marland KitchenHYPERLIPIDEMIA patient's lipid profile was checked and he was found to have an LDL at 137 with a triglyceride level of 360 and an overall cholesterol 233. Initially was started on Crestor. For outpatient I am converting him to Pravachol at 40 mg which is covered under the [employer] $4 plan.  .Ischemic cardiomyopathy-see below.  .Acute systolic congestive heart failure: After review of the patient's cardiac catheterization, was concerned about the possibility of volume overload. A BNP was checked and found to be elevated at 1306. Patient was started on by mouth Lasix and has diuresed approximately 1-2 L over the course of 36 hours. The plan will be to discharge the patient home on Lasix 40 by mouth daily plus potassium supplement. He is also been provided CHF education. Again cardiology will see him in the next 7-10 days.  .Obesity, unspecified: Patient was counseled on importance of weight loss and exercise.   Day of Discharge BP 136/84  Pulse 94  Temp(Src) 97.4 F (36.3 C) (Oral)  Resp 20  Ht 6\' 6"  (1.981 m)  Wt 130.863 kg (288 lb 8 oz)  BMI 33.34 kg/m2  SpO2 100%  Physical Exam: General appearance: alert, cooperative, no distress and uncooperative  Cardiovascular: Regular rate and rhythm, S1-S2, 2/6 systolic ejection murmur.  Lungs: clear to auscultation bilaterally  Abdomen: soft, non-tender; bowel sounds normal; no masses, no organomegaly  Extremities: No clubbing  cyanosis or edema   Results for orders placed during the hospital encounter of 11/02/11 (from the past 24 hour(s))  GLUCOSE, CAPILLARY     Status: Abnormal   Collection Time   11/05/11 12:25 PM      Component Value Range   Glucose-Capillary 220 (*) 70 - 99 (mg/dL)  GLUCOSE, CAPILLARY     Status: Abnormal   Collection Time   11/05/11  5:16 PM      Component Value Range   Glucose-Capillary 212 (*) 70 - 99 (mg/dL)   Comment 1 Notify RN    GLUCOSE, CAPILLARY     Status: Abnormal   Collection Time   11/05/11  8:41 PM      Component Value Range   Glucose-Capillary 146 (*) 70 - 99 (mg/dL)  GLUCOSE, CAPILLARY     Status: Normal   Collection Time   11/06/11 12:41 AM      Component Value Range   Glucose-Capillary 95  70 - 99 (mg/dL)  GLUCOSE, CAPILLARY     Status: Abnormal   Collection Time   11/06/11  3:59 AM      Component Value Range   Glucose-Capillary 127 (*) 70 - 99 (mg/dL)  CBC  Status: Abnormal   Collection Time   11/06/11  5:37 AM      Component Value Range   WBC 10.9 (*) 4.0 - 10.5 (K/uL)   RBC 5.05  4.22 - 5.81 (MIL/uL)   Hemoglobin 14.0  13.0 - 17.0 (g/dL)   HCT 16.1  09.6 - 04.5 (%)   MCV 80.4  78.0 - 100.0 (fL)   MCH 27.7  26.0 - 34.0 (pg)   MCHC 34.5  30.0 - 36.0 (g/dL)   RDW 40.9  81.1 - 91.4 (%)   Platelets 209  150 - 400 (K/uL)  BASIC METABOLIC PANEL     Status: Abnormal   Collection Time   11/06/11  5:37 AM      Component Value Range   Sodium 141  135 - 145 (mEq/L)   Potassium 3.6  3.5 - 5.1 (mEq/L)   Chloride 103  96 - 112 (mEq/L)   CO2 27  19 - 32 (mEq/L)   Glucose, Bld 159 (*) 70 - 99 (mg/dL)   BUN 21  6 - 23 (mg/dL)   Creatinine, Ser 7.82  0.50 - 1.35 (mg/dL)   Calcium 9.4  8.4 - 95.6 (mg/dL)   GFR calc non Af Amer 77 (*) >90 (mL/min)   GFR calc Af Amer 89 (*) >90 (mL/min)    Disposition: Improved   Follow-up Appts: Discharge Orders    Future Appointments: Provider: Department: Dept Phone: Center:   11/13/2011 11:30 AM Beatrice Lecher, PA  [facility] [phone] [facility]     Future Orders Please Complete By Expires   Diet - low sodium heart healthy      Diet Carb Modified      Increase activity slowly         January 10,2013 at 2:30pm at [facility] with Dr.Shaw, [phone] Awaiting callback from [facility] to see if they can see and help manage the patient's diabetes prior to his appointment in 2 months with [facility].  Tests Needing Follow-up: None  Signed: Nasri Boakye K 11/06/2011, 10:35 AM

## 2011-11-06 NOTE — Progress Notes (Signed)
Have filled out pt assist form form effient. Pt has effient card for 30days of free med. Md wrote prescription so he can get 30days free. Pt assist form faxed but pt has original to send in w financial papers.The prescription will be mailed to dr [facility]. Other prescriptions for $4.00 meds. Pt has appt for follow up w [facility] and for [facility]. He will also follow up w dr hochrein.

## 2011-11-12 ENCOUNTER — Encounter: Payer: Self-pay | Admitting: *Deleted

## 2011-11-13 ENCOUNTER — Ambulatory Visit (INDEPENDENT_AMBULATORY_CARE_PROVIDER_SITE_OTHER): Payer: Self-pay | Admitting: Physician Assistant

## 2011-11-13 ENCOUNTER — Encounter: Payer: Self-pay | Admitting: Physician Assistant

## 2011-11-13 ENCOUNTER — Encounter: Payer: Self-pay | Admitting: *Deleted

## 2011-11-13 DIAGNOSIS — I2589 Other forms of chronic ischemic heart disease: Secondary | ICD-10-CM

## 2011-11-13 DIAGNOSIS — I255 Ischemic cardiomyopathy: Secondary | ICD-10-CM

## 2011-11-13 DIAGNOSIS — I2510 Atherosclerotic heart disease of native coronary artery without angina pectoris: Secondary | ICD-10-CM

## 2011-11-13 DIAGNOSIS — E1165 Type 2 diabetes mellitus with hyperglycemia: Secondary | ICD-10-CM

## 2011-11-13 DIAGNOSIS — IMO0002 Reserved for concepts with insufficient information to code with codable children: Secondary | ICD-10-CM

## 2011-11-13 DIAGNOSIS — I10 Essential (primary) hypertension: Secondary | ICD-10-CM

## 2011-11-13 DIAGNOSIS — E785 Hyperlipidemia, unspecified: Secondary | ICD-10-CM

## 2011-11-13 DIAGNOSIS — I5022 Chronic systolic (congestive) heart failure: Secondary | ICD-10-CM

## 2011-11-13 LAB — BASIC METABOLIC PANEL
BUN: 26 mg/dL — ABNORMAL HIGH (ref 6–23)
Calcium: 9.4 mg/dL (ref 8.4–10.5)
Creatinine, Ser: 1.4 mg/dL (ref 0.4–1.5)
GFR: 59.34 mL/min — ABNORMAL LOW (ref >60.00)
Glucose, Bld: 82 mg/dL (ref 70–99)

## 2011-11-13 NOTE — Assessment & Plan Note (Signed)
Arrange L/L in 6-8 weeks. Goal LDL < 70.

## 2011-11-13 NOTE — Assessment & Plan Note (Signed)
We will check on his appt with the [facility] for him.

## 2011-11-13 NOTE — Assessment & Plan Note (Signed)
Controlled.  Continue current therapy.  

## 2011-11-13 NOTE — Assessment & Plan Note (Signed)
Will plan on scheduling follow up echo in the future (3 mos out from PCI) to reassess LVF.  If remains < 35%, refer to EP for consideration of ICD.

## 2011-11-13 NOTE — Assessment & Plan Note (Signed)
Volume stable.  I would like to see his HR lower, but his BP is too soft to titrate his meds further.  He is interested in cardiac rehab and I have encouraged it as long as he can afford it.  Continue current meds.  Check bmet today to follow up on renal fxn and K+.  Follow up with me in 4 weeks and Dr. Rollene Rotunda in 3 mos.

## 2011-11-13 NOTE — Assessment & Plan Note (Addendum)
Continue ASA and Effient.  Will follow up on his Effient assistance and try to provide him with samples if we have them.  Proceed with cardiac rehab as noted.  He may return to work in one week at [employer] duty.  I will give him a note.

## 2011-11-13 NOTE — Progress Notes (Signed)
History of Present Illness: PCP:  Dr. Tye Savoy, [facility] Primary Cardiologist:  Dr. Rollene Rotunda   [name] is a 40 y.o. male who presents for post hospital follow up.  He was admitted 11/3-11/7.  Presented with chest pain in the setting of uncontrolled HTN and DM2.  He ruled in for NSTEMI.  LHC 11/04/11: pLAD 90%, mLAD 60-70%, small D2 sub totally occluded at ostium, small OM1 90% ostial, 90% mid, mOM2 30%, oPL 80%, RCA 30%, dPDA 80%, EF 20% with ant AK.  PCI:  Promus DES to pLAD.  Echo 11/03/11: mod LVH, mild focal basal septal hypertrophy, EF 15%, grade 2 diast dysfxn, mild MR, mild to mod LAE, mild RVE, mild to mod reduced RVSF.  He developed systolic CHF and was diuresed and CHF meds were titrated.  He was enrolled in assistance programs due to lack of finances.  He is to establish with a PCP at [facility] soon and was placed on a sulfonylurea for his DM2.    Pertinent labs: K 3.6, creatinine 1.19, ALT 23, Hgb 14, TC 233, TG 316, HDL 33, LDL 137, A1C 12.9, TSH 2.312, peak trop 6.13, BNP 1306.  CXR unremarkable.   Doing well.  The patient denies chest pain, shortness of breath, syncope, orthopnea, PND or significant pedal edema.  Has been walking 20 mins a day.  No chest pain or dyspnea.  Probably class 2.  No palpitations.  Taking all meds.  Has not heard about his follow up with new PCP yet.    Past Medical History  Diagnosis Date  . CAD (coronary artery disease)     NSTEMI 10/2011: LHC 11/04/11: pLAD 90%, mLAD 60-70%, small D2 sub totally occluded at ostium, small OM1 90% ostial, 90% mid, mOM2 30%, oPL 80%, RCA 30%, dPDA 80%, EF 20% with ant AK.  PCI:  Promus DES to pLAD.  . Ischemic cardiomyopathy     Echo 11/03/11: mod LVH, mild focal basal septal hypertrophy, EF 15%, grade 2 diast dysfxn, mild MR, mild to mod LAE, mild RVE, mild to mod reduced RVSF  . DM2 (diabetes mellitus, type 2)   . HTN (hypertension)   . Hyperlipidemia   . Chronic systolic heart failure   . Obesity      Current Outpatient Prescriptions  Medication Sig Dispense Refill  . aspirin 81 MG chewable tablet Chew 1 tablet (81 mg total) by mouth daily.  30 tablet  1  . carvedilol (COREG) 6.25 MG tablet Take 1 tablet (6.25 mg total) by mouth 2 (two) times daily with a meal.  60 tablet  1  . furosemide (LASIX) 40 MG tablet Take 1 tablet (40 mg total) by mouth daily.  30 tablet  1  . glipiZIDE (GLUCOTROL) 10 MG tablet Take 1 tablet (10 mg total) by mouth 2 (two) times daily before a meal.  60 tablet  1  . isosorbide mononitrate (IMDUR) 60 MG 24 hr tablet Take 1 tablet (60 mg total) by mouth daily.  60 tablet  0  . lisinopril (PRINIVIL,ZESTRIL) 20 MG tablet Take 1 tablet (20 mg total) by mouth daily.  30 tablet  1  . nitroGLYCERIN (NITROSTAT) 0.4 MG SL tablet Place 1 tablet (0.4 mg total) under the tongue every 5 (five) minutes x 3 doses as needed for chest pain.  20 tablet  1  . potassium chloride (K-DUR) 10 MEQ tablet Take 2 tablets (20 mEq total) by mouth daily.  30 tablet  1  . prasugrel (EFFIENT) 10  MG TABS Take 1 tablet (10 mg total) by mouth daily.  30 tablet  3  . pravastatin (PRAVACHOL) 40 MG tablet Take 1 tablet (40 mg total) by mouth daily.  30 tablet  1    Allergies: No Known Allergies  History  Substance Use Topics  . Smoking status: Never Smoker   . Smokeless tobacco: Not on file  . Alcohol Use: Yes     OCCASIONAL      Vital Signs: BP 108/70  Pulse 85  Ht 6\' 6"  (1.981 m)  Wt 286 lb (129.729 kg)  BMI 33.05 kg/m2  PHYSICAL EXAM: Well nourished, well developed, in no acute distress HEENT: normal Neck: no JVD Cardiac:  normal S1, S2; RRR; no murmur Lungs:  Decreased breath sounds bilaterally, no wheezing, rhonchi or rales Abd: soft, nontender, no hepatomegaly Ext: no edema; RFA site without hematoma or bruit Skin: warm and dry Neuro:  CNs 2-12 intact, no focal abnormalities noted  EKG:  NSR, HR 85, normal axis, PRWP, NSSTTW changes, QTc 473 ms, no significant change when  compared to prior tracings.  ASSESSMENT AND PLAN:

## 2011-11-13 NOTE — Patient Instructions (Addendum)
Your physician recommends that you schedule a follow-up appointment in: 4 weeks with Tereso Newcomer, PA-C  Your physician recommends that you schedule a follow-up appointment in: 3 months with Dr. Antoine Poche  YOU HAVE BEEN GIVEN A FORM TO FILL OUT FOR THE EFFIENT ASSISTANCE PROGRAM THROUGH [facility], PLEASE FILL OUT YOUR PORTION OF THE FORM AND RETURN WITH THE REQUIRED INFORMATION PER [facility] TO OUR OFFICE AND WE WILL FAX OVER. WE WILL CALL YOU WHEN YOUR PRESCRIPTION COMES IN   Your physician recommends that you return for lab work in: TODAY BMET  YOU HAVE BEEN GIVEN A WORK NOTE TODAY STATING YOU HAVE BEEN PUT ON LIGHT DUTY WITH NO LIFTING OVER 10 LB'S PER SCOTT WEAVER, PA-C  Your physician recommends that you return for lab work in: 12/30/11 FOR A FASTING LIVER/LIPID PANEL 272.4

## 2011-11-14 ENCOUNTER — Telehealth: Payer: Self-pay | Admitting: Cardiology

## 2011-11-14 NOTE — Progress Notes (Signed)
Addended by: Tarri Fuller on: 11/14/2011 11:01 AM   Modules accepted: Orders

## 2011-11-14 NOTE — Telephone Encounter (Signed)
Reviewed labs with pt.  encouraged him to drink plenty of water however watch for increase in edema at feet and leg or abdomin.  Also monitor for any shortness of breath.  Pt states understanding.

## 2011-11-14 NOTE — Telephone Encounter (Signed)
followup up to previous call He was calling about test results and what he needs to do

## 2011-11-18 ENCOUNTER — Emergency Department (HOSPITAL_COMMUNITY)
Admission: EM | Admit: 2011-11-18 | Discharge: 2011-11-18 | Disposition: A | Discharge status: Home / Self Care | Payer: Self-pay | Attending: Emergency Medicine | Admitting: Emergency Medicine

## 2011-11-18 ENCOUNTER — Encounter (HOSPITAL_COMMUNITY): Payer: Self-pay | Admitting: *Deleted

## 2011-11-18 ENCOUNTER — Telehealth: Payer: Self-pay | Admitting: *Deleted

## 2011-11-18 DIAGNOSIS — Z7982 Long term (current) use of aspirin: Secondary | ICD-10-CM | POA: Insufficient documentation

## 2011-11-18 DIAGNOSIS — I10 Essential (primary) hypertension: Secondary | ICD-10-CM | POA: Insufficient documentation

## 2011-11-18 DIAGNOSIS — K068 Other specified disorders of gingiva and edentulous alveolar ridge: Secondary | ICD-10-CM

## 2011-11-18 DIAGNOSIS — K055 Other periodontal diseases: Secondary | ICD-10-CM | POA: Insufficient documentation

## 2011-11-18 DIAGNOSIS — I2510 Atherosclerotic heart disease of native coronary artery without angina pectoris: Secondary | ICD-10-CM | POA: Insufficient documentation

## 2011-11-18 DIAGNOSIS — E785 Hyperlipidemia, unspecified: Secondary | ICD-10-CM | POA: Insufficient documentation

## 2011-11-18 DIAGNOSIS — Z79899 Other long term (current) drug therapy: Secondary | ICD-10-CM | POA: Insufficient documentation

## 2011-11-18 DIAGNOSIS — I5022 Chronic systolic (congestive) heart failure: Secondary | ICD-10-CM | POA: Insufficient documentation

## 2011-11-18 DIAGNOSIS — E119 Type 2 diabetes mellitus without complications: Secondary | ICD-10-CM | POA: Insufficient documentation

## 2011-11-18 LAB — CBC
HCT: 40 % (ref 39.0–52.0)
Hemoglobin: 14.1 g/dL (ref 13.0–17.0)
MCH: 28.1 pg (ref 26.0–34.0)
MCHC: 35.3 g/dL (ref 30.0–36.0)
MCV: 79.8 fL (ref 78.0–100.0)
Platelets: 251 10*3/uL (ref 150–400)
RBC: 5.01 MIL/uL (ref 4.22–5.81)
RDW: 12.7 % (ref 11.5–15.5)
WBC: 10.6 10*3/uL — ABNORMAL HIGH (ref 4.0–10.5)

## 2011-11-18 LAB — PROTIME-INR
INR: 1.06 (ref 0.00–1.49)
Prothrombin Time: 14 seconds (ref 11.6–15.2)

## 2011-11-18 NOTE — Telephone Encounter (Signed)
Agree Tereso Newcomer, PA-C  1:32 PM 11/18/2011

## 2011-11-18 NOTE — Telephone Encounter (Signed)
Pt calling stating that around his tooth was bleeding. That is why his mouth was bleeding and pt wanted to make sure that pt needs to come to go to ED/Urgent care.  Please return pt call to discuss further.  Pam recommended msg be sent to [facility].

## 2011-11-18 NOTE — ED Notes (Signed)
Pt told to come here by cardiologist. Pt awoke with bleeding gums this am. Pt is on anticoagulants s/p stent placement 5 weeks ago. Denies pain. No bleeding evident upon assessment.

## 2011-11-18 NOTE — Telephone Encounter (Signed)
pt calls in today stating he woke up around 3 am with bleeding in his mouth. said he called the on call dr. and was told to look in his mouth for any source of bleeding and tcb in the am, pt states the bleeding was coming from around one of his teeth. I asked pt if he had a loose tooth and he state no. D/W Tereso Newcomer, PA and his advice is for pt needs to go to ED/Urgent care, and to NOT stop asa 81 or effient per Tereso Newcomer, PA-C. Pt gave verbal understanding to these instructions today. Danielle Rankin

## 2011-11-18 NOTE — ED Notes (Signed)
To ed for eval of blood around tooth this am when he woke. After washing his mouth out with water and it stopped. No bleeding now. No trauma noted. Pt has no c/o now.

## 2011-11-23 NOTE — ED Provider Notes (Signed)
History    39ym with gingival bleeding. Noted this am. Resolved. No bleeding from anywhere else. No diziness, lightheaded, sob or cp. No brbpr or melena. No n/v. Recent cath and started on effient.  CSN: [id number] Arrival date & time: 11/18/2011 11:46 AM   First MD Initiated Contact with Patient 11/18/11 1243      Chief Complaint  Patient presents with  . Coagulation Disorder    (Consider location/radiation/quality/duration/timing/severity/associated sxs/prior treatment) HPI  Past Medical History  Diagnosis Date  . CAD (coronary artery disease)     NSTEMI 10/2011: LHC 11/04/11: pLAD 90%, mLAD 60-70%, small D2 sub totally occluded at ostium, small OM1 90% ostial, 90% mid, mOM2 30%, oPL 80%, RCA 30%, dPDA 80%, EF 20% with ant AK.  PCI:  Promus DES to pLAD.  . Ischemic cardiomyopathy     Echo 11/03/11: mod LVH, mild focal basal septal hypertrophy, EF 15%, grade 2 diast dysfxn, mild MR, mild to mod LAE, mild RVE, mild to mod reduced RVSF  . DM2 (diabetes mellitus, type 2)   . HTN (hypertension)   . Hyperlipidemia   . Chronic systolic heart failure   . Obesity     Past Surgical History  Procedure Date  . Cardiac catheterization 11/04/11    LVEF LESS THAN 20%  . None     Family History  Problem Relation Age of Onset  . Heart disease Mother     MOTHER HAD CABG    History  Substance Use Topics  . Smoking status: Never Smoker   . Smokeless tobacco: Not on file  . Alcohol Use: Yes     OCCASIONAL      Review of Systems   Review of symptoms negative unless otherwise noted in HPI.   Allergies  Review of patient's allergies indicates no known allergies.  Home Medications   Current Outpatient Rx  Name Route Sig Dispense Refill  . ASPIRIN 81 MG PO CHEW Oral Chew 1 tablet (81 mg total) by mouth daily. 30 tablet 1  . CARVEDILOL 6.25 MG PO TABS Oral Take 1 tablet (6.25 mg total) by mouth 2 (two) times daily with a meal. 60 tablet 1  . FUROSEMIDE 40 MG PO TABS Oral Take 1  tablet (40 mg total) by mouth daily. 30 tablet 1  . GLIPIZIDE 10 MG PO TABS Oral Take 1 tablet (10 mg total) by mouth 2 (two) times daily before a meal. 60 tablet 1  . ISOSORBIDE MONONITRATE ER 60 MG PO TB24 Oral Take 1 tablet (60 mg total) by mouth daily. 60 tablet 0  . LISINOPRIL 20 MG PO TABS Oral Take 1 tablet (20 mg total) by mouth daily. 30 tablet 1  . POTASSIUM CHLORIDE CRYS CR 20 MEQ PO TBCR Oral Take 20 mEq by mouth daily.      [name] PRASUGREL HCL 10 MG PO TABS Oral Take 1 tablet (10 mg total) by mouth daily. 30 tablet 3  . PRAVASTATIN SODIUM 40 MG PO TABS Oral Take 1 tablet (40 mg total) by mouth daily. 30 tablet 1  . NITROGLYCERIN 0.4 MG SL SUBL Sublingual Place 1 tablet (0.4 mg total) under the tongue every 5 (five) minutes x 3 doses as needed for chest pain. 20 tablet 1    BP 148/98  Pulse 95  Temp(Src) 98.6 F (37 C) (Oral)  Resp 16  SpO2 100%  Physical Exam  Nursing note and vitals reviewed. Constitutional: He appears well-developed and well-nourished. No distress.  HENT:  Head: Normocephalic and atraumatic.  Eyes: Conjunctivae are normal. Right eye exhibits no discharge. Left eye exhibits no discharge.  Neck: Neck supple.  Cardiovascular: Normal rate, regular rhythm and normal heart sounds.  Exam reveals no gallop and no friction rub.   No murmur heard. Pulmonary/Chest: Effort normal and breath sounds normal. No respiratory distress.  Abdominal: Soft. He exhibits no distension. There is no tenderness.  Musculoskeletal: He exhibits no edema and no tenderness.  Neurological: He is alert.  Skin: Skin is warm and dry.  Psychiatric: He has a normal mood and affect. His behavior is normal. Thought content normal.    ED Course  Procedures (including critical care time)  Labs Reviewed  CBC - Abnormal; Notable for the following:    WBC 10.6 (*)    All other components within normal limits  PROTIME-INR  LAB REPORT - SCANNED   No results found.   1. Gingival bleeding         MDM  39yM with gingival bleeding. No evidence of active bleeding on exam. Denies signs/symptoms of serious bleeding elsewhere. Hd stable instructed to continue meds as prescribed. Has fu appt soon.        Raeford Razor, MD 11/23/11 [phone]

## 2011-11-29 ENCOUNTER — Other Ambulatory Visit: Payer: Self-pay | Admitting: *Deleted

## 2011-12-02 ENCOUNTER — Other Ambulatory Visit (INDEPENDENT_AMBULATORY_CARE_PROVIDER_SITE_OTHER): Payer: Self-pay | Admitting: *Deleted

## 2011-12-02 ENCOUNTER — Telehealth: Payer: Self-pay | Admitting: Cardiology

## 2011-12-02 DIAGNOSIS — I255 Ischemic cardiomyopathy: Secondary | ICD-10-CM

## 2011-12-02 DIAGNOSIS — E1165 Type 2 diabetes mellitus with hyperglycemia: Secondary | ICD-10-CM

## 2011-12-02 DIAGNOSIS — I5022 Chronic systolic (congestive) heart failure: Secondary | ICD-10-CM

## 2011-12-02 DIAGNOSIS — I10 Essential (primary) hypertension: Secondary | ICD-10-CM

## 2011-12-02 DIAGNOSIS — I2510 Atherosclerotic heart disease of native coronary artery without angina pectoris: Secondary | ICD-10-CM

## 2011-12-02 DIAGNOSIS — E785 Hyperlipidemia, unspecified: Secondary | ICD-10-CM

## 2011-12-02 DIAGNOSIS — I2589 Other forms of chronic ischemic heart disease: Secondary | ICD-10-CM

## 2011-12-02 LAB — BASIC METABOLIC PANEL
BUN: 22 mg/dL (ref 6–23)
Calcium: 9.1 mg/dL (ref 8.4–10.5)
Creatinine, Ser: 1.2 mg/dL (ref 0.4–1.5)
GFR: 71.35 mL/min (ref >60.00)
Glucose, Bld: 86 mg/dL (ref 70–99)
Sodium: 139 mEq/L (ref 135–145)

## 2011-12-02 LAB — HEPATIC FUNCTION PANEL
ALT: 21 U/L (ref 0–53)
AST: 19 U/L (ref 0–37)
Alkaline Phosphatase: 43 U/L (ref 39–117)
Bilirubin, Direct: 0.1 mg/dL (ref 0.0–0.3)
Total Bilirubin: 0.7 mg/dL (ref 0.3–1.2)

## 2011-12-02 LAB — LIPID PANEL: HDL: 35.8 mg/dL — ABNORMAL LOW (ref >39.00)

## 2011-12-02 NOTE — Telephone Encounter (Signed)
Pt feels like he has a cold coming on and wants to know what he can take.  Pt may use benadryl otc, plain robitussin, saline nasal spray or coricidin.  He states understanding.

## 2011-12-03 ENCOUNTER — Telehealth: Payer: Self-pay | Admitting: *Deleted

## 2011-12-03 ENCOUNTER — Other Ambulatory Visit: Payer: Self-pay | Admitting: *Deleted

## 2011-12-03 DIAGNOSIS — E785 Hyperlipidemia, unspecified: Secondary | ICD-10-CM

## 2011-12-03 MED ORDER — PRAVASTATIN SODIUM 40 MG PO TABS: 80.0000 mg | ORAL_TABLET | Freq: Every day | ORAL | Status: DC

## 2011-12-03 MED ORDER — FUROSEMIDE 40 MG PO TABS: 40.0000 mg | ORAL_TABLET | Freq: Every day | ORAL | Status: DC

## 2011-12-03 MED ORDER — CARVEDILOL 6.25 MG PO TABS: 6.2500 mg | ORAL_TABLET | Freq: Two times a day (BID) | ORAL | Status: DC

## 2011-12-03 MED ORDER — ISOSORBIDE MONONITRATE ER 60 MG PO TB24: 60.0000 mg | ORAL_TABLET | Freq: Every day | ORAL | Status: DC

## 2011-12-03 NOTE — Telephone Encounter (Signed)
S/w and pt states he needed refills on other medications as well. Danielle Rankin

## 2011-12-03 NOTE — Telephone Encounter (Signed)
Pt notified of lab results and to increase pravastatin to 80 mg qhs, new rx sent in today. Repeat flp/lft 01/31/12. Danielle Rankin

## 2011-12-04 ENCOUNTER — Telehealth: Payer: Self-pay | Admitting: Physician Assistant

## 2011-12-04 ENCOUNTER — Other Ambulatory Visit (HOSPITAL_COMMUNITY): Payer: Self-pay | Admitting: Internal Medicine

## 2011-12-04 MED ORDER — POTASSIUM CHLORIDE CRYS ER 20 MEQ PO TBCR: 20.0000 meq | EXTENDED_RELEASE_TABLET | Freq: Every day | ORAL | Status: DC

## 2011-12-04 NOTE — Telephone Encounter (Signed)
Rt call to carol

## 2011-12-04 NOTE — Telephone Encounter (Signed)
Refill K+ 20 meq qd. [name]

## 2011-12-11 ENCOUNTER — Ambulatory Visit (INDEPENDENT_AMBULATORY_CARE_PROVIDER_SITE_OTHER): Payer: Self-pay | Admitting: Physician Assistant

## 2011-12-11 ENCOUNTER — Encounter: Payer: Self-pay | Admitting: Physician Assistant

## 2011-12-11 DIAGNOSIS — E785 Hyperlipidemia, unspecified: Secondary | ICD-10-CM

## 2011-12-11 DIAGNOSIS — I10 Essential (primary) hypertension: Secondary | ICD-10-CM

## 2011-12-11 DIAGNOSIS — I5022 Chronic systolic (congestive) heart failure: Secondary | ICD-10-CM

## 2011-12-11 DIAGNOSIS — IMO0001 Reserved for inherently not codable concepts without codable children: Secondary | ICD-10-CM

## 2011-12-11 DIAGNOSIS — IMO0002 Reserved for concepts with insufficient information to code with codable children: Secondary | ICD-10-CM

## 2011-12-11 DIAGNOSIS — E1165 Type 2 diabetes mellitus with hyperglycemia: Secondary | ICD-10-CM

## 2011-12-11 DIAGNOSIS — I2510 Atherosclerotic heart disease of native coronary artery without angina pectoris: Secondary | ICD-10-CM

## 2011-12-11 MED ORDER — CARVEDILOL 12.5 MG PO TABS: 12.5000 mg | ORAL_TABLET | Freq: Two times a day (BID) | ORAL | Status: DC

## 2011-12-11 NOTE — Assessment & Plan Note (Signed)
Volume stable.  Blood pressure is elevated.  I will increase his Coreg to 12.5 mg twice daily.  He will need a followup echocardiogram early February to reassess his LV function.  He will followup with Dr. Rollene Rotunda in March.

## 2011-12-11 NOTE — Patient Instructions (Signed)
Your physician recommends that you schedule a follow-up appointment in: 2 WEEKS WITH [address], PA-C.  Your physician has recommended you make the following change in your medication: INCREASE COREG TO 12.5 MG TWICE DAILY A NEW PRESCRIPTION HAS BEEN SENT IN FOR YOU TODAY   Your physician has requested that you have an echocardiogram DX 428.22 HEART FAILURE, 401.1 HTN, 414.01 CAD THIS IS TO BE DONE AROUND 02/06/12 THIS WILL BE 3 MONTHS FROM MI. Echocardiography is a painless test that uses sound waves to create images of your heart. It provides your doctor with information about the size and shape of your heart and how well your heart's chambers and valves are working. This procedure takes approximately one hour. There are no restrictions for this procedure.   You have been referred to TO EITHER [facility] OR [facility] FOR DIABETES

## 2011-12-11 NOTE — Progress Notes (Signed)
[address] [place], [place]  [address] Phone: [phone] Fax:  [phone]  Date:  12/11/2011   Name:  [name]       DOB:  Sep 15, 1971 MRN:  [id number]  PCP:  none Primary Cardiologist:  Dr. Rollene Rotunda    History of Present Illness: [name] is a 40 y.o. male who presents for follow up.  Admitted 10/2011. Presented with chest pain in the setting of uncontrolled HTN and DM2. He ruled in for NSTEMI. LHC 11/04/11: pLAD 90%, mLAD 60-70%, small D2 sub totally occluded at ostium, small OM1 90% ostial, 90% mid, mOM2 30%, oPL 80%, RCA 30%, dPDA 80%, EF 20% with ant AK. PCI: Promus DES to pLAD. Echo 11/03/11: mod LVH, mild focal basal septal hypertrophy, EF 15%, grade 2 diast dysfxn, mild MR, mild to mod LAE, mild RVE, mild to mod reduced RVSF. He developed systolic CHF and was diuresed and CHF meds were titrated.  I saw him in followup 11/14.  He is doing well at that time.  I brought him back today for followup and further medication titration if possible.  In the interim, his pravastatin has been increased for better control of his lipids.  Followup labs: Potassium 3.8, creatinine 1.2.  He is still taking Effient.  He has not been established with PCP.  The patient denies chest pain, shortness of breath, syncope, orthopnea, PND or significant pedal edema.   Past Medical History  Diagnosis Date  . CAD (coronary artery disease)     NSTEMI 10/2011: LHC 11/04/11: pLAD 90%, mLAD 60-70%, small D2 sub totally occluded at ostium, small OM1 90% ostial, 90% mid, mOM2 30%, oPL 80%, RCA 30%, dPDA 80%, EF 20% with ant AK.  PCI:  Promus DES to pLAD.  . Ischemic cardiomyopathy     Echo 11/03/11: mod LVH, mild focal basal septal hypertrophy, EF 15%, grade 2 diast dysfxn, mild MR, mild to mod LAE, mild RVE, mild to mod reduced RVSF  . DM2 (diabetes mellitus, type 2)   . HTN (hypertension)   . Hyperlipidemia   . Chronic systolic heart failure   . Obesity      Current Outpatient Prescriptions  Medication Sig Dispense Refill  . aspirin 81 MG chewable tablet Chew 1 tablet (81 mg total) by mouth daily.  30 tablet  1  . carvedilol (COREG) 6.25 MG tablet Take 1 tablet (6.25 mg total) by mouth 2 (two) times daily with a meal.  60 tablet  6  . furosemide (LASIX) 40 MG tablet Take 1 tablet (40 mg total) by mouth daily.  30 tablet  6  . glipiZIDE (GLUCOTROL) 10 MG tablet Take 1 tablet (10 mg total) by mouth 2 (two) times daily before a meal.  60 tablet  1  . isosorbide mononitrate (IMDUR) 60 MG 24 hr tablet Take 1 tablet (60 mg total) by mouth daily.  60 tablet  6  . lisinopril (PRINIVIL,ZESTRIL) 20 MG tablet Take 1 tablet (20 mg total) by mouth daily.  30 tablet  1  . nitroGLYCERIN (NITROSTAT) 0.4 MG SL tablet Place 1 tablet (0.4 mg total) under the tongue every 5 (five) minutes x 3 doses as needed for chest pain.  20 tablet  1  . potassium chloride SA (K-DUR,KLOR-CON) 20 MEQ tablet Take 1 tablet (20 mEq total) by mouth daily.  30 tablet  11  . prasugrel (EFFIENT) 10 MG TABS Take 1 tablet (10 mg total) by mouth daily.  30 tablet  3  .  pravastatin (PRAVACHOL) 40 MG tablet Take 2 tablets (80 mg total) by mouth daily.  60 tablet  6    Allergies: No Known Allergies  History  Substance Use Topics  . Smoking status: Never Smoker   . Smokeless tobacco: Not on file  . Alcohol Use: Yes     OCCASIONAL     PHYSICAL EXAM: VS:  BP 142/88  Pulse 82  Ht 6\' 6"  (1.981 m)  Wt 282 lb (127.914 kg)  BMI 32.59 kg/m2 Repeat BP by me 146/110 on right  Well nourished, well developed, in no acute distress HEENT: normal Neck: no JVD Cardiac:  normal S1, S2; RRR; no murmur Lungs:  clear to auscultation bilaterally, no wheezing, rhonchi or rales Abd: soft, nontender, no hepatomegaly Ext: no edema Skin: warm and dry Neuro:  CNs 2-12 intact, no focal abnormalities noted  EKG:   Sinus rhythm, heart rate 84, normal axis, no acute changes  ASSESSMENT AND PLAN:

## 2011-12-11 NOTE — Assessment & Plan Note (Addendum)
Uncontrolled.  I have increased his Coreg as noted.  Followup with me in 2 weeks.

## 2011-12-11 NOTE — Assessment & Plan Note (Signed)
Stable.  Continue aspirin and Effient.  He was unable to afford cardiac rehabilitation.

## 2011-12-11 NOTE — Assessment & Plan Note (Signed)
I will again try to arrange followup with PCP.

## 2011-12-11 NOTE — Assessment & Plan Note (Signed)
Pravastatin recently adjusted as noted.

## 2011-12-27 ENCOUNTER — Encounter: Payer: Self-pay | Admitting: Physician Assistant

## 2011-12-27 ENCOUNTER — Ambulatory Visit (INDEPENDENT_AMBULATORY_CARE_PROVIDER_SITE_OTHER): Payer: Self-pay | Admitting: Physician Assistant

## 2011-12-27 DIAGNOSIS — E1165 Type 2 diabetes mellitus with hyperglycemia: Secondary | ICD-10-CM

## 2011-12-27 DIAGNOSIS — I10 Essential (primary) hypertension: Secondary | ICD-10-CM

## 2011-12-27 DIAGNOSIS — I5022 Chronic systolic (congestive) heart failure: Secondary | ICD-10-CM

## 2011-12-27 DIAGNOSIS — IMO0002 Reserved for concepts with insufficient information to code with codable children: Secondary | ICD-10-CM

## 2011-12-27 DIAGNOSIS — I2510 Atherosclerotic heart disease of native coronary artery without angina pectoris: Secondary | ICD-10-CM

## 2011-12-27 MED ORDER — PRASUGREL HCL 10 MG PO TABS: 10.0000 mg | ORAL_TABLET | Freq: Every day | ORAL | Status: DC

## 2011-12-27 MED ORDER — CARVEDILOL 25 MG PO TABS: 25.0000 mg | ORAL_TABLET | Freq: Two times a day (BID) | ORAL | Status: DC

## 2011-12-27 NOTE — Assessment & Plan Note (Signed)
Much better control.  Increase Coreg as noted.  Followup blood pressure and heart rate check with the nurse in 3 weeks.

## 2011-12-27 NOTE — Assessment & Plan Note (Addendum)
No angina.  Continue aspirin and Effient.

## 2011-12-27 NOTE — Assessment & Plan Note (Signed)
Volume stable.  Adjust Coreg to 25 mg twice a day.  Plan followup with echocardiogram in February and appointment with Dr. Antoine Poche in March.

## 2011-12-27 NOTE — Progress Notes (Signed)
[address] [place], [place]  [address] Phone: [phone] Fax:  [phone]  Date:  12/27/2011   Name:  [name]       DOB:  29-Jul-1971 MRN:  [id number]  PCP:  none Primary Cardiologist:  Dr. Rollene Rotunda    History of Present Illness: [name] is a 40 y.o. male who presents for follow up.  Admitted 10/2011. Presented with chest pain in the setting of uncontrolled HTN and DM2. He ruled in for NSTEMI. LHC 11/04/11: pLAD 90%, mLAD 60-70%, small D2 sub totally occluded at ostium, small OM1 90% ostial, 90% mid, mOM2 30%, oPL 80%, RCA 30%, dPDA 80%, EF 20% with ant AK. PCI: Promus DES to pLAD. Echo 11/03/11: mod LVH, mild focal basal septal hypertrophy, EF 15%, grade 2 diast dysfxn, mild MR, mild to mod LAE, mild RVE, mild to mod reduced RVSF. He developed systolic CHF and was diuresed and CHF meds were titrated.  I saw him in followup 12/12.   His blood pressure was uncontrolled.  Coreg was adjusted.  He presents for followup of blood pressure.  Doing well.  The patient denies chest pain, shortness of breath, syncope, orthopnea, PND or significant pedal edema.   Past Medical History  Diagnosis Date  . CAD (coronary artery disease)     NSTEMI 10/2011: LHC 11/04/11: pLAD 90%, mLAD 60-70%, small D2 sub totally occluded at ostium, small OM1 90% ostial, 90% mid, mOM2 30%, oPL 80%, RCA 30%, dPDA 80%, EF 20% with ant AK.  PCI:  Promus DES to pLAD.  . Ischemic cardiomyopathy     Echo 11/03/11: mod LVH, mild focal basal septal hypertrophy, EF 15%, grade 2 diast dysfxn, mild MR, mild to mod LAE, mild RVE, mild to mod reduced RVSF  . DM2 (diabetes mellitus, type 2)   . HTN (hypertension)   . Hyperlipidemia   . Chronic systolic heart failure   . Obesity     Current Outpatient Prescriptions  Medication Sig Dispense Refill  . aspirin 81 MG chewable tablet Chew 1 tablet (81 mg total) by mouth daily.  30 tablet  1  . carvedilol (COREG) 12.5 MG tablet Take 1  tablet (12.5 mg total) by mouth 2 (two) times daily.  60 tablet  11  . furosemide (LASIX) 40 MG tablet Take 1 tablet (40 mg total) by mouth daily.  30 tablet  6  . glipiZIDE (GLUCOTROL) 10 MG tablet Take 1 tablet (10 mg total) by mouth 2 (two) times daily before a meal.  60 tablet  1  . isosorbide mononitrate (IMDUR) 60 MG 24 hr tablet Take 1 tablet (60 mg total) by mouth daily.  60 tablet  6  . lisinopril (PRINIVIL,ZESTRIL) 20 MG tablet Take 1 tablet (20 mg total) by mouth daily.  30 tablet  1  . nitroGLYCERIN (NITROSTAT) 0.4 MG SL tablet Place 1 tablet (0.4 mg total) under the tongue every 5 (five) minutes x 3 doses as needed for chest pain.  20 tablet  1  . potassium chloride SA (K-DUR,KLOR-CON) 20 MEQ tablet Take 1 tablet (20 mEq total) by mouth daily.  30 tablet  11  . prasugrel (EFFIENT) 10 MG TABS Take 1 tablet (10 mg total) by mouth daily.  30 tablet  3  . pravastatin (PRAVACHOL) 40 MG tablet Take 2 tablets (80 mg total) by mouth daily.  60 tablet  6    Allergies: No Known Allergies  History  Substance Use Topics  . Smoking status: Never  Smoker   . Smokeless tobacco: Not on file  . Alcohol Use: Yes     OCCASIONAL     PHYSICAL EXAM: VS:  BP 136/98  Pulse 100  Resp 18  Ht 6\' 6"  (1.981 m)  Wt 285 lb 1.9 oz (129.33 kg)  BMI 32.95 kg/m2 Repeat BP by me 120/92 on the left   Well nourished, well developed, in no acute distress HEENT: normal Neck: no JVD Cardiac:  normal S1, S2; RRR; no murmur Lungs:  clear to auscultation bilaterally, no wheezing, rhonchi or rales Abd: soft, nontender, no hepatomegaly Ext: no edema Skin: warm and dry Neuro:  CNs 2-12 intact, no focal abnormalities noted  ASSESSMENT AND PLAN:

## 2011-12-27 NOTE — Assessment & Plan Note (Signed)
He has an appointment today with eligibility for [facility].

## 2011-12-27 NOTE — Patient Instructions (Signed)
Your physician has recommended you make the following change in your medication: Increase Carvedilol to 25 mg, 1 tablet twice daily.  Please return in 3 weeks for a blood pressure check with one of our nurses.    Please keep your existing appointments for Dr. Antoine Poche and for your Echocardiogram.    .

## 2011-12-30 ENCOUNTER — Other Ambulatory Visit: Payer: Self-pay | Admitting: *Deleted

## 2012-01-05 ENCOUNTER — Other Ambulatory Visit (HOSPITAL_COMMUNITY): Payer: Self-pay | Admitting: Internal Medicine

## 2012-01-07 ENCOUNTER — Telehealth: Payer: Self-pay

## 2012-01-07 ENCOUNTER — Other Ambulatory Visit: Payer: Self-pay | Admitting: Family Medicine

## 2012-01-07 MED ORDER — LISINOPRIL 20 MG PO TABS: 20.0000 mg | ORAL_TABLET | Freq: Every day | ORAL | Status: DC

## 2012-01-07 MED ORDER — GLIPIZIDE 10 MG PO TABS: 10.0000 mg | ORAL_TABLET | Freq: Two times a day (BID) | ORAL | Status: DC

## 2012-01-07 NOTE — Telephone Encounter (Signed)
Refill request

## 2012-01-07 NOTE — Telephone Encounter (Signed)
Pt was called to let him know that his effient is in and he needs to pick it up.  While on the phone, he wanted [name] to know that he was having pain in his foot after increasing his carvedilol.  He cut it back to the original dose yesterday and his foot pain subsided.  He wants to know if he can stay on the lower dose of carvedilol?

## 2012-01-07 NOTE — Telephone Encounter (Signed)
Pt was notified.  

## 2012-01-07 NOTE — Telephone Encounter (Signed)
Foot pain not likely related.  Likely a coincidence.  He can stay on old dose for a week, then increase back to new dose. Tereso Newcomer, PA-C  4:39 PM 01/07/2012

## 2012-01-16 ENCOUNTER — Ambulatory Visit (INDEPENDENT_AMBULATORY_CARE_PROVIDER_SITE_OTHER): Payer: Self-pay

## 2012-01-16 VITALS — BP 162/112 | HR 78 | Resp 18 | Ht 78.0 in | Wt 288.5 lb

## 2012-01-16 DIAGNOSIS — I10 Essential (primary) hypertension: Secondary | ICD-10-CM

## 2012-01-16 NOTE — Progress Notes (Signed)
Agree Tereso Newcomer, PA-C  12:29 PM 01/16/2012

## 2012-01-16 NOTE — Progress Notes (Signed)
Patient in for B/P check. On 12/27/11 office visit with PA, Carvedilol was increased from 12.5 mg to 25 mg twice a day. On 01 08/13 patient C/O of foot pain thinking it may be caused by the medication increase , medication was changed back to old dose. Today B/P was  taken with patient taken 12.5 mg twice a day. Right arm 162/112 pulse 78 beats/ minute left arm 163/117 pulse 74 beats/minute pt. Has not taken the medication this AM. Lorin Picket weaver PA aware, he recommends for pt to start take Carvedilol 25 mg twice a day. Re-check B/P in 2 to 3 weeks. Patient has an appointment for B/P check on 02/06/12 at 10:00 AM patient aware.

## 2012-01-31 ENCOUNTER — Other Ambulatory Visit: Payer: Self-pay | Admitting: *Deleted

## 2012-02-06 ENCOUNTER — Other Ambulatory Visit: Payer: Self-pay | Admitting: Physician Assistant

## 2012-02-06 ENCOUNTER — Other Ambulatory Visit (HOSPITAL_COMMUNITY): Payer: Self-pay | Admitting: Radiology

## 2012-02-06 ENCOUNTER — Other Ambulatory Visit: Payer: Self-pay | Admitting: *Deleted

## 2012-02-28 ENCOUNTER — Other Ambulatory Visit: Payer: Self-pay

## 2012-02-28 ENCOUNTER — Other Ambulatory Visit (HOSPITAL_COMMUNITY): Payer: Self-pay

## 2012-03-03 ENCOUNTER — Other Ambulatory Visit: Payer: Self-pay

## 2012-03-03 ENCOUNTER — Ambulatory Visit (INDEPENDENT_AMBULATORY_CARE_PROVIDER_SITE_OTHER): Payer: Self-pay

## 2012-03-03 ENCOUNTER — Ambulatory Visit (HOSPITAL_COMMUNITY): Payer: Self-pay | Attending: Cardiology

## 2012-03-03 ENCOUNTER — Other Ambulatory Visit: Payer: Self-pay | Admitting: *Deleted

## 2012-03-03 ENCOUNTER — Telehealth: Payer: Self-pay | Admitting: Cardiology

## 2012-03-03 VITALS — BP 130/98 | HR 68 | Ht 78.0 in | Wt 289.8 lb

## 2012-03-03 DIAGNOSIS — I2510 Atherosclerotic heart disease of native coronary artery without angina pectoris: Secondary | ICD-10-CM

## 2012-03-03 DIAGNOSIS — I5022 Chronic systolic (congestive) heart failure: Secondary | ICD-10-CM | POA: Insufficient documentation

## 2012-03-03 DIAGNOSIS — I10 Essential (primary) hypertension: Secondary | ICD-10-CM | POA: Insufficient documentation

## 2012-03-03 DIAGNOSIS — E785 Hyperlipidemia, unspecified: Secondary | ICD-10-CM | POA: Insufficient documentation

## 2012-03-03 DIAGNOSIS — E119 Type 2 diabetes mellitus without complications: Secondary | ICD-10-CM | POA: Insufficient documentation

## 2012-03-03 DIAGNOSIS — E1165 Type 2 diabetes mellitus with hyperglycemia: Secondary | ICD-10-CM

## 2012-03-03 MED ORDER — LISINOPRIL 40 MG PO TABS: 40.0000 mg | ORAL_TABLET | Freq: Every day | ORAL | Status: DC

## 2012-03-03 NOTE — Telephone Encounter (Signed)
FU Call: pt needs all RX's sent over to [facility]. Pt doesn't necessarily need refills of all medications he just wants all RX's sent over to [facility].  Please return pt call to discuss further if necessary.

## 2012-03-03 NOTE — Telephone Encounter (Signed)
Received call from [facility] pharmacy stating they are accepting patient's medications.Verbal order given to refill aspirin 81 mg daily #30 refill x 11,carvedilol 25 mg twice daily # 60 refill x 11, furosemide 40 mg daily # 30 refill x 11,isosorbide 60 mg daily # 30 refill x 11,lisinopril 40 mg daily # 30 refill x 11,NTG 0.4 mg sl prn # 25 refill x 11,kdur 20 meq daily # 30 refill x 11,pravastatin 40 mg 2 tablets daily # 60 refill x 6.

## 2012-03-03 NOTE — Progress Notes (Signed)
Patient's B/P 130/98 pulse-68/min.Spoke to [facility] PA he advised to increase lisinopril 40 mg daily.Keep appointment with Dr.Hochrein 03/13/12 and have bmet.

## 2012-03-05 ENCOUNTER — Telehealth: Payer: Self-pay | Admitting: *Deleted

## 2012-03-05 NOTE — Telephone Encounter (Signed)
pt notified of echo results and gave verbal understanding today. Danielle Rankin  pt states his refills were supposed to go to [facility] but were sent to the pharmacy, but he said he will pick them @ pharm today but maybe next refills can go to [facility]. Danielle Rankin

## 2012-03-05 NOTE — Telephone Encounter (Signed)
Message copied by Tarri Fuller on Thu Mar 05, 2012 10:24 AM ------      Message from: [place], [place] T      Created: Wed Mar 04, 2012  1:20 PM       EF now normal (previously EF was 15%)      Continue current medical therapy      Tereso Newcomer, PA-C  1:20 PM 03/04/2012

## 2012-03-10 ENCOUNTER — Telehealth: Payer: Self-pay | Admitting: Physician Assistant

## 2012-03-10 NOTE — Telephone Encounter (Signed)
LOV,Echo,12 faxed to [facility] @ [phone] 03/10/12/km

## 2012-03-12 ENCOUNTER — Other Ambulatory Visit: Payer: Self-pay | Admitting: Cardiology

## 2012-03-12 DIAGNOSIS — E785 Hyperlipidemia, unspecified: Secondary | ICD-10-CM

## 2012-03-12 NOTE — Telephone Encounter (Signed)
New Msg: Pt calling stating he needs refills of Klor-Con, pravastatin, and glipizide called into [facility]. Please return pt call to discuss further if necessary.

## 2012-03-12 NOTE — Telephone Encounter (Signed)
Pravastatin and Klor-Con have been called to [facility] (see phone note dated March 03, 2012). I spoke with [facility] pharmacist and they have meds ready for pick up. Message left on pt's identified voice mail that cardiac meds have been refilled and are available for pick up. Message left to contact primary provider for non cardiac meds.  Left message to call back if questions

## 2012-03-13 ENCOUNTER — Ambulatory Visit (INDEPENDENT_AMBULATORY_CARE_PROVIDER_SITE_OTHER): Payer: Self-pay | Admitting: Cardiology

## 2012-03-13 ENCOUNTER — Other Ambulatory Visit: Payer: Self-pay

## 2012-03-13 ENCOUNTER — Encounter: Payer: Self-pay | Admitting: Cardiology

## 2012-03-13 VITALS — BP 140/76 | HR 88 | Resp 18 | Ht 77.0 in | Wt 294.1 lb

## 2012-03-13 DIAGNOSIS — I2510 Atherosclerotic heart disease of native coronary artery without angina pectoris: Secondary | ICD-10-CM

## 2012-03-13 DIAGNOSIS — E669 Obesity, unspecified: Secondary | ICD-10-CM

## 2012-03-13 DIAGNOSIS — I10 Essential (primary) hypertension: Secondary | ICD-10-CM

## 2012-03-13 DIAGNOSIS — I5022 Chronic systolic (congestive) heart failure: Secondary | ICD-10-CM

## 2012-03-13 NOTE — Patient Instructions (Signed)
The current medical regimen is effective;  continue present plan and medications.  Follow up in 6 months with Dr Hochrein.  You will receive a letter in the mail 2 months before you are due.  Please call us when you receive this letter to schedule your follow up appointment.  

## 2012-03-13 NOTE — Assessment & Plan Note (Signed)
The patient has no new sypmtoms.  No further cardiovascular testing is indicated.  We will continue with aggressive risk reduction and meds as listed.  

## 2012-03-13 NOTE — Assessment & Plan Note (Signed)
His blood pressure is improved.  He needs to lose weight for even better control.

## 2012-03-13 NOTE — Assessment & Plan Note (Signed)
We discussed weight loss again.

## 2012-03-13 NOTE — Progress Notes (Signed)
HPI The patient presents for follow up of CAD and cardiomyopathy.  Since I last saw him he has done well.  The patient denies any new symptoms such as chest discomfort, neck or arm discomfort. There has been no new shortness of breath, PND or orthopnea. There have been no reported palpitations, presyncope or syncope.  He says he feels better than he has in quite awhile.  He is not walking as much as I would like.  Of note his EF was up to 50%.  No Known Allergies  Current Outpatient Prescriptions  Medication Sig Dispense Refill  . aspirin 81 MG chewable tablet Chew 1 tablet (81 mg total) by mouth daily.  30 tablet  1  . carvedilol (COREG) 25 MG tablet Take 1 tablet (25 mg total) by mouth 2 (two) times daily.  60 tablet  3  . furosemide (LASIX) 40 MG tablet Take 1 tablet (40 mg total) by mouth daily.  30 tablet  6  . glipiZIDE (GLUCOTROL) 10 MG tablet TAKE ONE TABLET BY MOUTH TWICE DAILY BEFORE MEALS  60 tablet  1  . isosorbide mononitrate (IMDUR) 60 MG 24 hr tablet Take 1 tablet (60 mg total) by mouth daily.  60 tablet  6  . lisinopril (PRINIVIL,ZESTRIL) 40 MG tablet Take 1 tablet (40 mg total) by mouth daily.  30 tablet  11  . nitroGLYCERIN (NITROSTAT) 0.4 MG SL tablet Place 1 tablet (0.4 mg total) under the tongue every 5 (five) minutes x 3 doses as needed for chest pain.  20 tablet  1  . potassium chloride SA (K-DUR,KLOR-CON) 20 MEQ tablet Take 1 tablet (20 mEq total) by mouth daily.  30 tablet  11  . prasugrel (EFFIENT) 10 MG TABS Take 1 tablet (10 mg total) by mouth daily.  90 tablet  1  . pravastatin (PRAVACHOL) 40 MG tablet Take 2 tablets (80 mg total) by mouth daily.  60 tablet  6    Past Medical History  Diagnosis Date  . CAD (coronary artery disease)     NSTEMI 10/2011: LHC 11/04/11: pLAD 90%, mLAD 60-70%, small D2 sub totally occluded at ostium, small OM1 90% ostial, 90% mid, mOM2 30%, oPL 80%, RCA 30%, dPDA 80%, EF 20% with ant AK.  PCI:  Promus DES to pLAD.  . Ischemic  cardiomyopathy     Echo 11/03/11: mod LVH, mild focal basal septal hypertrophy, EF 15%, grade 2 diast dysfxn, mild MR, mild to mod LAE, mild RVE, mild to mod reduced RVSF.  EF 3/5 50% by echo  . DM2 (diabetes mellitus, type 2)   . HTN (hypertension)   . Hyperlipidemia   . Chronic systolic heart failure   . Obesity     Past Surgical History  Procedure Date  . None     ROS:  As stated in the HPI and negative for all other systems.  PHYSICAL EXAM BP 140/76  Pulse 88  Resp 18  Ht 6\' 5"  (1.956 m)  Wt 294 lb 1.9 oz (133.412 kg)  BMI 34.88 kg/m2 GENERAL:  Well appearing HEENT:  Pupils equal round and reactive, fundi not visualized, oral mucosa unremarkable NECK:  No jugular venous distention, waveform within normal limits, carotid upstroke brisk and symmetric, no bruits, no thyromegaly LYMPHATICS:  No cervical, inguinal adenopathy LUNGS:  Clear to auscultation bilaterally BACK:  No CVA tenderness CHEST:  Unremarkable HEART:  PMI not displaced or sustained,S1 and S2 within normal limits, no S3, no S4, no clicks, no rubs, no murmurs  ABD:  Flat, positive bowel sounds normal in frequency in pitch, no bruits, no rebound, no guarding, no midline pulsatile mass, no hepatomegaly, no splenomegaly EXT:  2 plus pulses throughout, no edema, no cyanosis no clubbing SKIN:  No rashes no nodules NEURO:  Cranial nerves II through XII grossly intact, motor grossly intact throughout PSYCH:  Cognitively intact, oriented to person place and time  ASSESSMENT AND PLAN

## 2012-03-13 NOTE — Assessment & Plan Note (Signed)
His EF is much improved.  He will continue on the meds as listed.

## 2012-04-20 ENCOUNTER — Ambulatory Visit: Payer: Self-pay | Admitting: *Deleted

## 2012-05-18 ENCOUNTER — Telehealth: Payer: Self-pay | Admitting: Cardiology

## 2012-05-18 NOTE — Telephone Encounter (Signed)
New msg Pt wants to know if he can get effient thru drug company. Please call

## 2012-05-18 NOTE — Telephone Encounter (Signed)
Pt states he has already been enrolled and needs a refill for his Effient.  Will complete the paperwork and fax into company.

## 2012-05-18 NOTE — Telephone Encounter (Signed)
Paperwork faxed °

## 2012-05-18 NOTE — Telephone Encounter (Signed)
Will forward to Pam

## 2012-05-26 NOTE — Telephone Encounter (Signed)
Pt calling re status of paperwork for the effient, only has 4 pills left,  pls call [phone], pt requesting samples , pls call

## 2012-05-26 NOTE — Telephone Encounter (Signed)
Pt aware refill was faxed 5/20.  Samples will be placed at front desk and pt will pick up.

## 2012-06-03 ENCOUNTER — Telehealth: Payer: Self-pay | Admitting: Cardiology

## 2012-06-03 NOTE — Telephone Encounter (Signed)
Pt only has 5 pills of the samples of effient left so if we still don't have his refill can he get more samples please

## 2012-06-03 NOTE — Telephone Encounter (Signed)
Left message that package from [facility] is here and he can come by and pick them up.

## 2012-06-08 ENCOUNTER — Telehealth: Payer: Self-pay | Admitting: Cardiology

## 2012-06-08 NOTE — Telephone Encounter (Signed)
WALK-IN PT FORM ....... PT STATES EYES ARE PUFFY AND HAS MEDICAL ? ABOUT SOMETHING HE HAS READ. PLEASE CALL PT BACK SO HE CAN READ IT TO YOU. @ [phone]  06-08-12 TK

## 2012-08-17 ENCOUNTER — Telehealth: Payer: Self-pay | Admitting: Cardiology

## 2012-08-17 NOTE — Telephone Encounter (Signed)
Pt was referred to [facility] and since they have closed who should he go to? Pt on potassium, it went up in price, wants to know if we can change to something cheaper? Uses [facility] [place] pls call [phone]

## 2012-08-17 NOTE — Telephone Encounter (Signed)
Pt aware KCL 20 meq per [facility] is to cost $19.46.  Rx Relief card mailed to pt s home address.

## 2012-09-17 ENCOUNTER — Telehealth: Payer: Self-pay | Admitting: Cardiology

## 2012-09-17 NOTE — Telephone Encounter (Signed)
F/u   Patient calling back to find out what cough meds are safe for him to take, not feeling well.

## 2012-09-17 NOTE — Telephone Encounter (Signed)
New problem:  What can of cold medication can patient take.

## 2012-09-17 NOTE — Telephone Encounter (Signed)
Pt aware to use plain Robitussin for cough.  He has no other s/s at this time.

## 2012-10-07 ENCOUNTER — Telehealth: Payer: Self-pay | Admitting: Cardiology

## 2012-10-07 NOTE — Telephone Encounter (Signed)
New problem:  Samples of effient

## 2012-10-08 NOTE — Telephone Encounter (Signed)
**Note De-Identified [name] Obfuscation** Left detailed message on VM that samples of Effient are at the front desk and he may pick up./LV

## 2012-10-08 NOTE — Telephone Encounter (Signed)
F/u   plz return call to patient regarding Effient meds, which  Are normally picked up from this office rather than pharmacy. Pt is down to his last pill, can be reached at [phone]

## 2012-10-09 NOTE — Telephone Encounter (Signed)
Refill form faxed into [facility] for pt.

## 2012-10-27 ENCOUNTER — Telehealth: Payer: Self-pay | Admitting: *Deleted

## 2012-10-27 NOTE — Telephone Encounter (Signed)
Samples of Effient received from [place]  Lot # [id number]  #4 bottles.  Pt aware and will pick up this week

## 2013-03-11 ENCOUNTER — Telehealth: Payer: Self-pay | Admitting: Cardiology

## 2013-03-11 NOTE — Telephone Encounter (Signed)
Refill request form filled out and given to Dr Antoine Poche.  Will be faxed once it has been signed.

## 2013-03-11 NOTE — Telephone Encounter (Signed)
Pt needs reorder of effient with the pt assistance program, pls call when he can pick it up at [phone]

## 2013-03-19 NOTE — Telephone Encounter (Signed)
Paperwork for refill request was completed today and taken to MR to be faxed into the company.  Will call pt when samples are here for pick up

## 2013-03-22 ENCOUNTER — Telehealth: Payer: Self-pay | Admitting: Cardiology

## 2013-03-22 NOTE — Telephone Encounter (Signed)
Exp 01/2014  Lot # [id number]  #14

## 2013-03-22 NOTE — Telephone Encounter (Signed)
Plz return call to patient at [phone] regarding Effient samples

## 2013-03-24 ENCOUNTER — Telehealth: Payer: Self-pay

## 2013-03-24 ENCOUNTER — Other Ambulatory Visit: Payer: Self-pay

## 2013-03-24 ENCOUNTER — Telehealth: Payer: Self-pay | Admitting: Cardiology

## 2013-03-24 DIAGNOSIS — E785 Hyperlipidemia, unspecified: Secondary | ICD-10-CM

## 2013-03-24 MED ORDER — GLIPIZIDE 10 MG PO TABS: ORAL_TABLET | ORAL | Status: DC

## 2013-03-24 MED ORDER — PRAVASTATIN SODIUM 40 MG PO TABS: 80.0000 mg | ORAL_TABLET | Freq: Every day | ORAL | Status: DC

## 2013-03-24 NOTE — Telephone Encounter (Signed)
pt overdue for f/u with Dr.Hochrein. made appt for 06/04/13. ok to refill glipizide and pravastatin per Janith Lima. minimum amount given until pt keeps scheduled appt.

## 2013-03-24 NOTE — Telephone Encounter (Signed)
New problem   Pt stated the medication that was called in for Parvastin instead of it being $4 it went to $29 he want to know if it is possibe for him to get the cheaper medication called in to [facility] [phone]

## 2013-03-24 NOTE — Telephone Encounter (Signed)
Nothing different at this time that will cost any less  Pt aware

## 2013-03-31 ENCOUNTER — Other Ambulatory Visit: Payer: Self-pay | Admitting: Physician Assistant

## 2013-04-02 ENCOUNTER — Telehealth: Payer: Self-pay | Admitting: Cardiology

## 2013-04-02 NOTE — Telephone Encounter (Signed)
Samples available  Lot number [id number]  Exp 03/2014  #28   Needs new application  Left message for pt samples and new app placed at front desk for him to pick up  Requested he call back with any questions

## 2013-04-02 NOTE — Telephone Encounter (Signed)
New Prob   Pt is calling to see if we have any samples available of EFFIENT. Would like to speak to nurse.

## 2013-04-05 ENCOUNTER — Telehealth: Payer: Self-pay | Admitting: Cardiology

## 2013-04-05 NOTE — Telephone Encounter (Signed)
New Prob   Pt has some questions regarding assistance program and where to get documentation forms. Would like to speak to nurse.

## 2013-04-05 NOTE — Telephone Encounter (Signed)
Pt again aware paperwork is in the bag with his samples that her at the front desk.  He is instructed to get the paperwork review information necessary and can either complete it and send in his self or complete it and bring it back and I will fax it to the company.  He states understanding

## 2013-04-13 ENCOUNTER — Encounter: Payer: Self-pay | Admitting: Cardiology

## 2013-05-03 ENCOUNTER — Telehealth: Payer: Self-pay | Admitting: Cardiology

## 2013-05-03 ENCOUNTER — Other Ambulatory Visit: Payer: Self-pay | Admitting: Cardiology

## 2013-05-03 NOTE — Telephone Encounter (Signed)
Spoke with patient who is requesting Efient 10 mg samples.  After reviewing patient's chart and seeing that patient was supposed to complete paperwork for Rx assistance, I questioned patient regarding whether he had done this yet.  Patient states he mailed paperwork off 2 weeks ago and then a few days ago the sealed envelope was returned to his mailbox with no explanation as to why it was returned.  Patient states he is going to bring it to [facility] for [place] to fax.  I informed patient that Efient 10 mg samples are at the front desk.  Patient states that he is not certain he is taking all of the correct medications.  I reviewed the patient's list with him and discovered that he has stopped taking Lasix (he states we would not renew the Rx) but is still taking KCl daily.  I informed patient to hold this until he hears back from [place] since he is no longer on Lasix.  Patient states the provider at [facility] stopped his Coreg due to dizziness and since stopping his dizziness has improved.  Patient questioned whether he was supposed to still be taking Imdur and I informed him that his chart indicates this was stopped in Dec. 2013.  I informed patient that I was forwarding this message to Dr. Antoine Poche and Elita Quick for their review.

## 2013-05-03 NOTE — Telephone Encounter (Signed)
New Prob    Pt is requesting some samples of EFIENT if available.

## 2013-05-04 NOTE — Telephone Encounter (Signed)
..   Requested Prescriptions   Pending Prescriptions Disp Refills  . lisinopril (PRINIVIL,ZESTRIL) 20 MG tablet [Pharmacy Med Name: LISINOPRIL 20MG      TAB] 60 tablet 0    Sig: TAKE TWO TABLETS BY MOUTH ONCE DAILY  ..Patient needs to contact office to schedule  Appointment  for future refills.Ph:[phone]. Thank you.

## 2013-05-04 NOTE — Telephone Encounter (Signed)
Pt continues to take medications he determines necessary and needs an appt to further evaluate for his needs.  appt scheduled for 06/04/2013

## 2013-06-01 ENCOUNTER — Telehealth: Payer: Self-pay | Admitting: Cardiology

## 2013-06-01 NOTE — Telephone Encounter (Signed)
Returned call to patient office out of effient samples.

## 2013-06-01 NOTE — Telephone Encounter (Signed)
New Prob    Pt is requesting some samples of EFFIENT.

## 2013-06-02 ENCOUNTER — Encounter: Payer: Self-pay | Admitting: Cardiology

## 2013-06-02 ENCOUNTER — Ambulatory Visit (INDEPENDENT_AMBULATORY_CARE_PROVIDER_SITE_OTHER): Payer: Self-pay | Admitting: Cardiology

## 2013-06-02 VITALS — BP 155/102 | HR 101 | Ht 78.0 in | Wt 309.4 lb

## 2013-06-02 DIAGNOSIS — E119 Type 2 diabetes mellitus without complications: Secondary | ICD-10-CM

## 2013-06-02 DIAGNOSIS — I5022 Chronic systolic (congestive) heart failure: Secondary | ICD-10-CM

## 2013-06-02 DIAGNOSIS — I509 Heart failure, unspecified: Secondary | ICD-10-CM

## 2013-06-02 DIAGNOSIS — I2510 Atherosclerotic heart disease of native coronary artery without angina pectoris: Secondary | ICD-10-CM

## 2013-06-02 DIAGNOSIS — I5021 Acute systolic (congestive) heart failure: Secondary | ICD-10-CM

## 2013-06-02 DIAGNOSIS — E7800 Pure hypercholesterolemia, unspecified: Secondary | ICD-10-CM

## 2013-06-02 MED ORDER — CARVEDILOL 12.5 MG PO TABS: 12.5000 mg | ORAL_TABLET | Freq: Two times a day (BID) | ORAL | Status: DC

## 2013-06-02 MED ORDER — ASPIRIN EC 325 MG PO TBEC: 325.0000 mg | DELAYED_RELEASE_TABLET | Freq: Every day | ORAL | Status: DC

## 2013-06-02 NOTE — Patient Instructions (Addendum)
Please stop your Effient Increase ASA to 325 mg a day Take Carvedilol 12.5 mg one twice a day. Continue all other medications as listed.  Please return fasting for blood work. (lipid, liver, HA1C, CBC and TSH)  Follow up in 2 weeks with PA/NP.

## 2013-06-02 NOTE — Progress Notes (Signed)
HPI The patient presents for follow up of CAD and cardiomyopathy.  Since I last saw him he is no longer seeing his primary care doctor since [facility] closed.  The patient denies any new symptoms such as chest discomfort, neck or arm discomfort. There has been no new shortness of breath, PND or orthopnea. There have been no reported palpitations, presyncope or syncope.  He says he feels better than he has in quite awhile.  He is walking for exercise.  He says he gained some weight. He's not had any blood work including a followup of his diabetes recently. He tells me he was taken off of his beta blocker because his blood pressure was low. He's also been out of his Effient frequently  No Known Allergies  Current Outpatient Prescriptions  Medication Sig Dispense Refill  . glipiZIDE (GLUCOTROL) 10 MG tablet TAKE ONE TABLET BY MOUTH TWICE DAILY BEFORE MEALS  60 tablet  2  . lisinopril (PRINIVIL,ZESTRIL) 20 MG tablet TAKE TWO TABLETS BY MOUTH ONCE DAILY  60 tablet  0  . nitroGLYCERIN (NITROSTAT) 0.4 MG SL tablet Place 1 tablet (0.4 mg total) under the tongue every 5 (five) minutes x 3 doses as needed for chest pain.  20 tablet  1  . prasugrel (EFFIENT) 10 MG TABS Take 1 tablet (10 mg total) by mouth daily.  90 tablet  1  . pravastatin (PRAVACHOL) 40 MG tablet Take 2 tablets (80 mg total) by mouth daily.  60 tablet  2   No current facility-administered medications for this visit.    Past Medical History  Diagnosis Date  . CAD (coronary artery disease)     NSTEMI 10/2011: LHC 11/04/11: pLAD 90%, mLAD 60-70%, small D2 sub totally occluded at ostium, small OM1 90% ostial, 90% mid, mOM2 30%, oPL 80%, RCA 30%, dPDA 80%, EF 20% with ant AK.  PCI:  Promus DES to pLAD.  . Ischemic cardiomyopathy     Echo 11/03/11: mod LVH, mild focal basal septal hypertrophy, EF 15%, grade 2 diast dysfxn, mild MR, mild to mod LAE, mild RVE, mild to mod reduced RVSF.  EF 3/5 50% by echo  . DM2 (diabetes mellitus, type 2)    . HTN (hypertension)   . Hyperlipidemia   . Chronic systolic heart failure   . Obesity     Past Surgical History  Procedure Laterality Date  . None      ROS:  As stated in the HPI and negative for all other systems.  PHYSICAL EXAM BP 155/102  Pulse 101  Ht 6\' 6"  (1.981 m)  Wt 309 lb 6.4 oz (140.343 kg)  BMI 35.76 kg/m2 GENERAL:  Well appearing HEENT:  Pupils equal round and reactive, fundi not visualized, oral mucosa unremarkable NECK:  No jugular venous distention, waveform within normal limits, carotid upstroke brisk and symmetric, no bruits, no thyromegaly LYMPHATICS:  No cervical, inguinal adenopathy LUNGS:  Clear to auscultation bilaterally BACK:  No CVA tenderness CHEST:  Unremarkable HEART:  PMI not displaced or sustained,S1 and S2 within normal limits, no S3, no S4, no clicks, no rubs, no murmurs ABD:  Flat, positive bowel sounds normal in frequency in pitch, no bruits, no rebound, no guarding, no midline pulsatile mass, no hepatomegaly, no splenomegaly EXT:  2 plus pulses throughout, no edema, no cyanosis no clubbing SKIN:  No rashes no nodules NEURO:  Cranial nerves II through XII grossly intact, motor grossly intact throughout PSYCH:  Cognitively intact, oriented to person place and time  EKG:  SINUS TACHYCARDIA, RATE 101, AXIS WITHIN NORMAL LIMITS, INTERVALS WITHIN NORMAL LIMITS, NO ACUTE st-t WAVE CHANGES.  ASSESSMENT AND PLAN  CAD:  The patient has no new sypmtoms.  No further cardiovascular testing is indicated.  We will continue with aggressive risk reduction and meds as listed. He can stop his Ambien.  CARDIOMYOPATHY:  I'm going to restart his beta blocker. At this point he seems to be euvolemic. After we titrated his medications back to where they should be we can reassess an echocardiogram.  OBESITY:  He says he started exercising and gained weight. We discussed calorie counting.  HTN:  This is being managed in the context of treating his CHF.  HEALTH  MAINTENANCE:  I will check an A1c and other labs include basic metabolic profile and CBC. He really needs a primary care doctor.

## 2013-06-04 ENCOUNTER — Ambulatory Visit: Payer: Self-pay | Admitting: Cardiology

## 2013-06-07 ENCOUNTER — Other Ambulatory Visit: Payer: Self-pay

## 2013-06-07 MED ORDER — LISINOPRIL 20 MG PO TABS: ORAL_TABLET | ORAL | Status: DC

## 2013-06-07 NOTE — Telephone Encounter (Signed)
..   Requested Prescriptions   Signed Prescriptions Disp Refills  . lisinopril (PRINIVIL,ZESTRIL) 20 MG tablet 60 tablet 6    Sig: TAKE TWO TABLETS BY MOUTH ONCE DAILY    Authorizing Provider: Rollene Rotunda    Ordering User: Christella Hartigan, Tad Fancher [name]

## 2013-06-09 ENCOUNTER — Other Ambulatory Visit: Payer: Self-pay

## 2013-06-14 ENCOUNTER — Other Ambulatory Visit (INDEPENDENT_AMBULATORY_CARE_PROVIDER_SITE_OTHER): Payer: Self-pay

## 2013-06-14 DIAGNOSIS — I509 Heart failure, unspecified: Secondary | ICD-10-CM

## 2013-06-14 DIAGNOSIS — I2510 Atherosclerotic heart disease of native coronary artery without angina pectoris: Secondary | ICD-10-CM

## 2013-06-14 DIAGNOSIS — I5022 Chronic systolic (congestive) heart failure: Secondary | ICD-10-CM

## 2013-06-14 DIAGNOSIS — E7800 Pure hypercholesterolemia, unspecified: Secondary | ICD-10-CM

## 2013-06-14 DIAGNOSIS — E119 Type 2 diabetes mellitus without complications: Secondary | ICD-10-CM

## 2013-06-14 DIAGNOSIS — I5021 Acute systolic (congestive) heart failure: Secondary | ICD-10-CM

## 2013-06-14 LAB — CBC
HCT: 44.7 % (ref 39.0–52.0)
Hemoglobin: 15.1 g/dL (ref 13.0–17.0)
MCHC: 33.8 g/dL (ref 30.0–36.0)
MCV: 82 fl (ref 78.0–100.0)
RDW: 13.8 % (ref 11.5–14.6)

## 2013-06-14 LAB — LIPID PANEL
Cholesterol: 187 mg/dL (ref 0–200)
HDL: 38 mg/dL — ABNORMAL LOW (ref >39.00)
VLDL: 46.6 mg/dL — ABNORMAL HIGH (ref 0.0–40.0)

## 2013-06-14 LAB — TSH: TSH: 2.38 u[IU]/mL (ref 0.35–5.50)

## 2013-06-14 LAB — HEPATIC FUNCTION PANEL
Albumin: 3.7 g/dL (ref 3.5–5.2)
Total Protein: 7.3 g/dL (ref 6.0–8.3)

## 2013-06-18 ENCOUNTER — Ambulatory Visit: Payer: Self-pay | Admitting: Nurse Practitioner

## 2013-07-14 ENCOUNTER — Encounter: Payer: Self-pay | Admitting: Nurse Practitioner

## 2013-07-14 ENCOUNTER — Ambulatory Visit (INDEPENDENT_AMBULATORY_CARE_PROVIDER_SITE_OTHER): Payer: Self-pay | Admitting: Nurse Practitioner

## 2013-07-14 VITALS — BP 180/100 | HR 76 | Ht 78.0 in | Wt 310.8 lb

## 2013-07-14 DIAGNOSIS — I10 Essential (primary) hypertension: Secondary | ICD-10-CM

## 2013-07-14 DIAGNOSIS — I2589 Other forms of chronic ischemic heart disease: Secondary | ICD-10-CM

## 2013-07-14 DIAGNOSIS — I255 Ischemic cardiomyopathy: Secondary | ICD-10-CM

## 2013-07-14 MED ORDER — CARVEDILOL 25 MG PO TABS: 25.0000 mg | ORAL_TABLET | Freq: Two times a day (BID) | ORAL | Status: DC

## 2013-07-14 NOTE — Patient Instructions (Signed)
Increase the Coreg to 25 mg two times a day  I will see you in a month - take your medicines that day before I see you  Call the [facility] office at [phone] if you have any questions, problems or concerns.

## 2013-07-14 NOTE — Progress Notes (Signed)
[name] Date of Birth: [birth year] Medical Record #[id number]  History of Present Illness: Mr. Horen is seen back today for what was to be a 2 week check, but a 5 week visit. Seen for Dr. Antoine Poche. He has known CAD with NSTEMI in 2012 with DES to the LAD. EF was 20% by cath and 15% by echo. Improved to 50% by last echo from March of 2013. Other issues include type 2 DM - uncontrolled, HTN, HLD and obesity. Previously followed by [facility].   Seen last month. Beta blocker restarted for his CHF and resting tachycardia. Trying to retitrate medicines with plans to recheck an echo.   Comes in today. He is here alone. Doing ok. Trying to do better with his diet and to walk more. Has cut out the soda and fruit juices for the most part. No chest pain. Not short of breath. Not dizzy or lightheaded. Has had no medicines today. No swelling. He has a visit to get his "orange card" and then hopefully to a PCP.   Current Outpatient Prescriptions  Medication Sig Dispense Refill  . aspirin EC 325 MG tablet Take 1 tablet (325 mg total) by mouth daily.  30 tablet  0  . carvedilol (COREG) 12.5 MG tablet Take 1 tablet (12.5 mg total) by mouth 2 (two) times daily.  180 tablet  3  . glipiZIDE (GLUCOTROL) 10 MG tablet TAKE ONE TABLET BY MOUTH TWICE DAILY BEFORE MEALS  60 tablet  2  . lisinopril (PRINIVIL,ZESTRIL) 20 MG tablet TAKE TWO TABLETS BY MOUTH ONCE DAILY  60 tablet  6  . pravastatin (PRAVACHOL) 40 MG tablet Take 2 tablets (80 mg total) by mouth daily.  60 tablet  2  . nitroGLYCERIN (NITROSTAT) 0.4 MG SL tablet Place 1 tablet (0.4 mg total) under the tongue every 5 (five) minutes x 3 doses as needed for chest pain.  20 tablet  1   No current facility-administered medications for this visit.    No Known Allergies  Past Medical History  Diagnosis Date  . CAD (coronary artery disease)     NSTEMI 10/2011: LHC 11/04/11: pLAD 90%, mLAD 60-70%, small D2 sub totally occluded at ostium, small OM1  90% ostial, 90% mid, mOM2 30%, oPL 80%, RCA 30%, dPDA 80%, EF 20% with ant AK.  PCI:  Promus DES to pLAD.  . Ischemic cardiomyopathy     Echo 11/03/11: mod LVH, mild focal basal septal hypertrophy, EF 15%, grade 2 diast dysfxn, mild MR, mild to mod LAE, mild RVE, mild to mod reduced RVSF.  EF 3/5 50% by echo  . DM2 (diabetes mellitus, type 2)   . HTN (hypertension)   . Hyperlipidemia   . Chronic systolic heart failure   . Obesity     Past Surgical History  Procedure Laterality Date  . None      History  Smoking status  . Never Smoker   Smokeless tobacco  . Not on file    History  Alcohol Use  . Yes    Comment: OCCASIONAL    Family History  Problem Relation Age of Onset  . Heart disease Mother     MOTHER HAD CABG    Review of Systems: The review of systems is per the HPI.  All other systems were reviewed and are negative.  Physical Exam: Pulse 76  Ht 6\' 6"  (1.981 m)  Wt 310 lb 12.8 oz (140.978 kg)  BMI 35.92 kg/m2 Patient is very pleasant and in no acute distress.  He is obese. Skin is warm and dry. Color is normal.  HEENT is unremarkable. Normocephalic/atraumatic. PERRL. Sclera are nonicteric. Neck is supple. No masses. No JVD. Lungs are clear. Cardiac exam shows a regular rate and rhythm. Abdomen is soft. Extremities are without edema. Gait and ROM are intact. No gross neurologic deficits noted.  LABORATORY DATA:  Lab Results  Component Value Date   WBC 9.1 06/14/2013   HGB 15.1 06/14/2013   HCT 44.7 06/14/2013   PLT 204.0 06/14/2013   GLUCOSE 86 12/02/2011   CHOL 187 06/14/2013   TRIG 233.0* 06/14/2013   HDL 38.00* 06/14/2013   LDLDIRECT 117.0 06/14/2013   LDLCALC 96 12/02/2011   ALT 25 06/14/2013   AST 17 06/14/2013   NA 139 12/02/2011   K 3.8 12/02/2011   CL 103 12/02/2011   CREATININE 1.2 12/02/2011   BUN 22 12/02/2011   CO2 28 12/02/2011   TSH 2.38 06/14/2013   INR 1.06 11/18/2011   HGBA1C 11.4* 06/14/2013     Assessment / Plan: 1. CAD - no chest pain  reported.   2. Cardiomyopathy - last EF had improved from 15% to 50% - no symptoms reported.   3. Obesity - we reviewed ways to work on his weight.  4. HTN - no medicines today yet but BP still up - I have increased his Coreg to 25 mg BID - he was on this dose before. I will see him back in a month to reassess and have asked him to take his medicines prior to that visit.   Patient is agreeable to this plan and will call if any problems develop in the interim.   Rosalio Macadamia, RN, ANP-C [facility] [name] [place], [place]  [address]

## 2013-07-16 ENCOUNTER — Ambulatory Visit: Payer: Self-pay

## 2013-07-26 ENCOUNTER — Other Ambulatory Visit: Payer: Self-pay | Admitting: Cardiology

## 2013-08-11 ENCOUNTER — Ambulatory Visit: Payer: Self-pay | Admitting: Nurse Practitioner

## 2013-09-06 ENCOUNTER — Ambulatory Visit: Payer: Self-pay | Admitting: Nurse Practitioner

## 2014-03-27 ENCOUNTER — Other Ambulatory Visit: Payer: Self-pay | Admitting: Cardiology

## 2014-03-28 NOTE — Telephone Encounter (Signed)
Pt needs f/u appt.  Needs labwork as ordered and needs to obtain a PCP for management of his DM.

## 2014-04-17 ENCOUNTER — Other Ambulatory Visit: Payer: Self-pay | Admitting: Cardiology

## 2014-07-10 ENCOUNTER — Other Ambulatory Visit: Payer: Self-pay | Admitting: Cardiology

## 2014-08-30 ENCOUNTER — Other Ambulatory Visit: Payer: Self-pay | Admitting: Cardiology

## 2014-08-30 NOTE — Telephone Encounter (Signed)
Should this be deferred to pcp? Please advise. Thanks, MI 

## 2014-08-31 NOTE — Telephone Encounter (Signed)
Yes sent to PCP

## 2014-10-04 ENCOUNTER — Other Ambulatory Visit: Payer: Self-pay | Admitting: *Deleted

## 2014-10-04 MED ORDER — LISINOPRIL 20 MG PO TABS: ORAL_TABLET | ORAL | Status: DC

## 2014-11-25 ENCOUNTER — Emergency Department (HOSPITAL_COMMUNITY): Payer: Self-pay

## 2014-11-25 ENCOUNTER — Encounter (HOSPITAL_COMMUNITY): Payer: Self-pay | Admitting: Emergency Medicine

## 2014-11-25 ENCOUNTER — Emergency Department (HOSPITAL_COMMUNITY)
Admission: EM | Admit: 2014-11-25 | Discharge: 2014-11-25 | Disposition: A | Discharge status: Home / Self Care | Payer: Self-pay | Attending: Emergency Medicine | Admitting: Emergency Medicine

## 2014-11-25 DIAGNOSIS — E1165 Type 2 diabetes mellitus with hyperglycemia: Secondary | ICD-10-CM | POA: Insufficient documentation

## 2014-11-25 DIAGNOSIS — I2510 Atherosclerotic heart disease of native coronary artery without angina pectoris: Secondary | ICD-10-CM | POA: Insufficient documentation

## 2014-11-25 DIAGNOSIS — R739 Hyperglycemia, unspecified: Secondary | ICD-10-CM

## 2014-11-25 DIAGNOSIS — Z79899 Other long term (current) drug therapy: Secondary | ICD-10-CM | POA: Insufficient documentation

## 2014-11-25 DIAGNOSIS — E669 Obesity, unspecified: Secondary | ICD-10-CM | POA: Insufficient documentation

## 2014-11-25 DIAGNOSIS — I10 Essential (primary) hypertension: Secondary | ICD-10-CM | POA: Insufficient documentation

## 2014-11-25 DIAGNOSIS — Z7982 Long term (current) use of aspirin: Secondary | ICD-10-CM | POA: Insufficient documentation

## 2014-11-25 DIAGNOSIS — I5022 Chronic systolic (congestive) heart failure: Secondary | ICD-10-CM | POA: Insufficient documentation

## 2014-11-25 LAB — CBC WITH DIFFERENTIAL/PLATELET
Basophils Absolute: 0 10*3/uL (ref 0.0–0.1)
Basophils Relative: 0 % (ref 0–1)
Eosinophils Absolute: 0.1 10*3/uL (ref 0.0–0.7)
Eosinophils Relative: 1 % (ref 0–5)
HCT: 39.1 % (ref 39.0–52.0)
Hemoglobin: 13.7 g/dL (ref 13.0–17.0)
LYMPHS PCT: 17 % (ref 12–46)
Lymphs Abs: 1.9 10*3/uL (ref 0.7–4.0)
MCH: 27.2 pg (ref 26.0–34.0)
MCHC: 35 g/dL (ref 30.0–36.0)
MCV: 77.6 fL — ABNORMAL LOW (ref 78.0–100.0)
Monocytes Absolute: 0.6 10*3/uL (ref 0.1–1.0)
Monocytes Relative: 5 % (ref 3–12)
Neutro Abs: 8.4 10*3/uL — ABNORMAL HIGH (ref 1.7–7.7)
Neutrophils Relative %: 77 % (ref 43–77)
PLATELETS: 207 10*3/uL (ref 150–400)
RBC: 5.04 MIL/uL (ref 4.22–5.81)
RDW: 13.1 % (ref 11.5–15.5)
WBC: 11 10*3/uL — AB (ref 4.0–10.5)

## 2014-11-25 LAB — COMPREHENSIVE METABOLIC PANEL
ALT: 20 U/L (ref 0–53)
AST: 14 U/L (ref 0–37)
Albumin: 3.5 g/dL (ref 3.5–5.2)
Alkaline Phosphatase: 66 U/L (ref 39–117)
Anion gap: 16 — ABNORMAL HIGH (ref 5–15)
BILIRUBIN TOTAL: 0.9 mg/dL (ref 0.3–1.2)
BUN: 24 mg/dL — ABNORMAL HIGH (ref 6–23)
CHLORIDE: 98 meq/L (ref 96–112)
CO2: 21 meq/L (ref 19–32)
Calcium: 8.8 mg/dL (ref 8.4–10.5)
Creatinine, Ser: 0.94 mg/dL (ref 0.50–1.35)
GFR calc non Af Amer: >90 mL/min (ref >90)
Glucose, Bld: 354 mg/dL — ABNORMAL HIGH (ref 70–99)
Potassium: 4.1 mEq/L (ref 3.7–5.3)
Sodium: 135 mEq/L — ABNORMAL LOW (ref 137–147)
Total Protein: 7.1 g/dL (ref 6.0–8.3)

## 2014-11-25 LAB — TROPONIN I

## 2014-11-25 LAB — CBG MONITORING, ED
GLUCOSE-CAPILLARY: 269 mg/dL — AB (ref 70–99)
Glucose-Capillary: 363 mg/dL — ABNORMAL HIGH (ref 70–99)

## 2014-11-25 LAB — PRO B NATRIURETIC PEPTIDE: Pro B Natriuretic peptide (BNP): 1383 pg/mL — ABNORMAL HIGH (ref 0–125)

## 2014-11-25 MED ORDER — CARVEDILOL 25 MG PO TABS
25.0000 mg | ORAL_TABLET | Freq: Once | ORAL | Status: AC
  Administered 2014-11-25: 25 mg via ORAL
  Filled 2014-11-25: qty 1

## 2014-11-25 MED ORDER — FUROSEMIDE 20 MG PO TABS
20.0000 mg | ORAL_TABLET | Freq: Once | ORAL | Status: AC
  Administered 2014-11-25: 20 mg via ORAL
  Filled 2014-11-25: qty 1

## 2014-11-25 MED ORDER — LISINOPRIL 20 MG PO TABS
40.0000 mg | ORAL_TABLET | Freq: Once | ORAL | Status: AC
  Administered 2014-11-25: 40 mg via ORAL
  Filled 2014-11-25: qty 2

## 2014-11-25 MED ORDER — SODIUM CHLORIDE 0.9 % IV BOLUS (SEPSIS)
1000.0000 mL | Freq: Once | INTRAVENOUS | Status: AC
  Administered 2014-11-25: 1000 mL via INTRAVENOUS

## 2014-11-25 MED ORDER — LISINOPRIL 20 MG PO TABS: ORAL_TABLET | ORAL | Status: DC

## 2014-11-25 MED ORDER — FUROSEMIDE 20 MG PO TABS: 20.0000 mg | ORAL_TABLET | Freq: Every day | ORAL | Status: DC

## 2014-11-25 NOTE — Discharge Instructions (Signed)
Read the information below.  Use the prescribed medication as directed.  Please discuss all new medications with your pharmacist.  You may return to the Emergency Department at any time for worsening condition or any new symptoms that concern you.  If you develop chest pain, worsening shortness of breath, fever, you pass out, or become weak or dizzy, return to the ER for a recheck.   Please monitor your blood sugars and blood pressure closely.  Follow a diabetic diet.      Heart Failure Heart failure is a condition in which the heart has trouble pumping blood. This means your heart does not pump blood efficiently for your body to work well. In some cases of heart failure, fluid may back up into your lungs or you may have swelling (edema) in your lower legs. Heart failure is usually a long-term (chronic) condition. It is important for you to take good care of yourself and follow your health care provider's treatment plan. CAUSES  Some health conditions can cause heart failure. Those health conditions include:  High blood pressure (hypertension). Hypertension causes the heart muscle to work harder than normal. When pressure in the blood vessels is high, the heart needs to pump (contract) with more force in order to circulate blood throughout the body. High blood pressure eventually causes the heart to become stiff and weak.  Coronary artery disease (CAD). CAD is the buildup of cholesterol and fat (plaque) in the arteries of the heart. The blockage in the arteries deprives the heart muscle of oxygen and blood. This can cause chest pain and may lead to a heart attack. High blood pressure can also contribute to CAD.  Heart attack (myocardial infarction). A heart attack occurs when one or more arteries in the heart become blocked. The loss of oxygen damages the muscle tissue of the heart. When this happens, part of the heart muscle dies. The injured tissue does not contract as well and weakens the heart's  ability to pump blood.  Abnormal heart valves. When the heart valves do not open and close properly, it can cause heart failure. This makes the heart muscle pump harder to keep the blood flowing.  Heart muscle disease (cardiomyopathy or myocarditis). Heart muscle disease is damage to the heart muscle from a variety of causes. These can include drug or alcohol abuse, infections, or unknown reasons. These can increase the risk of heart failure.  Lung disease. Lung disease makes the heart work harder because the lungs do not work properly. This can cause a strain on the heart, leading it to fail.  Diabetes. Diabetes increases the risk of heart failure. High blood sugar contributes to high fat (lipid) levels in the blood. Diabetes can also cause slow damage to tiny blood vessels that carry important nutrients to the heart muscle. When the heart does not get enough oxygen and food, it can cause the heart to become weak and stiff. This leads to a heart that does not contract efficiently.  Other conditions can contribute to heart failure. These include abnormal heart rhythms, thyroid problems, and low blood counts (anemia). Certain unhealthy behaviors can increase the risk of heart failure, including:  Being overweight.  Smoking or chewing tobacco.  Eating foods high in fat and cholesterol.  Abusing illicit drugs or alcohol.  Lacking physical activity. SYMPTOMS  Heart failure symptoms may vary and can be hard to detect. Symptoms may include:  Shortness of breath with activity, such as climbing stairs.  Persistent cough.  Swelling of  the feet, ankles, legs, or abdomen.  Unexplained weight gain.  Difficulty breathing when lying flat (orthopnea).  Waking from sleep because of the need to sit up and get more air.  Rapid heartbeat.  Fatigue and loss of energy.  Feeling light-headed, dizzy, or close to fainting.  Loss of appetite.  Nausea.  Increased urination during the night  (nocturia). DIAGNOSIS  A diagnosis of heart failure is based on your history, symptoms, physical examination, and diagnostic tests. Diagnostic tests for heart failure may include:  Echocardiography.  Electrocardiography.  Chest X-ray.  Blood tests.  Exercise stress test.  Cardiac angiography.  Radionuclide scans. TREATMENT  Treatment is aimed at managing the symptoms of heart failure. Medicines, behavioral changes, or surgical intervention may be necessary to treat heart failure.  Medicines to help treat heart failure may include:  Angiotensin-converting enzyme (ACE) inhibitors. This type of medicine blocks the effects of a blood protein called angiotensin-converting enzyme. ACE inhibitors relax (dilate) the blood vessels and help lower blood pressure.  Angiotensin receptor blockers (ARBs). This type of medicine blocks the actions of a blood protein called angiotensin. Angiotensin receptor blockers dilate the blood vessels and help lower blood pressure.  Water pills (diuretics). Diuretics cause the kidneys to remove salt and water from the blood. The extra fluid is removed through urination. This loss of extra fluid lowers the volume of blood the heart pumps.  Beta blockers. These prevent the heart from beating too fast and improve heart muscle strength.  Digitalis. This increases the force of the heartbeat.  Healthy behavior changes include:  Obtaining and maintaining a healthy weight.  Stopping smoking or chewing tobacco.  Eating heart-healthy foods.  Limiting or avoiding alcohol.  Stopping illicit drug use.  Physical activity as directed by your health care provider.  Surgical treatment for heart failure may include:  A procedure to open blocked arteries, repair damaged heart valves, or remove damaged heart muscle tissue.  A pacemaker to improve heart muscle function and control certain abnormal heart rhythms.  An internal cardioverter defibrillator to treat  certain serious abnormal heart rhythms.  A left ventricular assist device (LVAD) to assist the pumping ability of the heart. HOME CARE INSTRUCTIONS   Take medicines only as directed by your health care provider. Medicines are important in reducing the workload of your heart, slowing the progression of heart failure, and improving your symptoms.  Do not stop taking your medicine unless directed by your health care provider.  Do not skip any dose of medicine.  Refill your prescriptions before you run out of medicine. Your medicines are needed every day.  Engage in moderate physical activity if directed by your health care provider. Moderate physical activity can benefit some people. The elderly and people with severe heart failure should consult with a health care provider for physical activity recommendations.  Eat heart-healthy foods. Food choices should be free of trans fat and low in saturated fat, cholesterol, and salt (sodium). Healthy choices include fresh or frozen fruits and vegetables, fish, lean meats, legumes, fat-free or low-fat dairy products, and whole grain or high fiber foods. Talk to a dietitian to learn more about heart-healthy foods.  Limit sodium if directed by your health care provider. Sodium restriction may reduce symptoms of heart failure in some people. Talk to a dietitian to learn more about heart-healthy seasonings.  Use healthy cooking methods. Healthy cooking methods include roasting, grilling, broiling, baking, poaching, steaming, or stir-frying. Talk to a dietitian to learn more about healthy cooking methods.  Limit fluids if directed by your health care provider. Fluid restriction may reduce symptoms of heart failure in some people.  Weigh yourself every day. Daily weights are important in the early recognition of excess fluid. You should weigh yourself every morning after you urinate and before you eat breakfast. Wear the same amount of clothing each time you  weigh yourself. Record your daily weight. Provide your health care provider with your weight record.  Monitor and record your blood pressure if directed by your health care provider.  Check your pulse if directed by your health care provider.  Lose weight if directed by your health care provider. Weight loss may reduce symptoms of heart failure in some people.  Stop smoking or chewing tobacco. Nicotine makes your heart work harder by causing your blood vessels to constrict. Do not use nicotine gum or patches before talking to your health care provider.  Keep all follow-up visits as directed by your health care provider. This is important.  Limit alcohol intake to no more than 1 drink per day for nonpregnant women and 2 drinks per day for men. One drink equals 12 ounces of beer, 5 ounces of wine, or 1 ounces of hard liquor. Drinking more than that is harmful to your heart. Tell your health care provider if you drink alcohol several times a week. Talk with your health care provider about whether alcohol is safe for you. If your heart has already been damaged by alcohol or you have severe heart failure, drinking alcohol should be stopped completely.  Stop illicit drug use.  Stay up-to-date with immunizations. It is especially important to prevent respiratory infections through current pneumococcal and influenza immunizations.  Manage other health conditions such as hypertension, diabetes, thyroid disease, or abnormal heart rhythms as directed by your health care provider.  Learn to manage stress.  Plan rest periods when fatigued.  Learn strategies to manage high temperatures. If the weather is extremely hot:  Avoid vigorous physical activity.  Use air conditioning or fans or seek a cooler location.  Avoid caffeine and alcohol.  Wear loose-fitting, lightweight, and light-colored clothing.  Learn strategies to manage cold temperatures. If the weather is extremely cold:  Avoid vigorous  physical activity.  Layer clothes.  Wear mittens or gloves, a hat, and a scarf when going outside.  Avoid alcohol.  Obtain ongoing education and support as needed.  Participate in or seek rehabilitation as needed to maintain or improve independence and quality of life. SEEK MEDICAL CARE IF:   Your weight increases by 03 lb/1.4 kg in 1 day or 05 lb/2.3 kg in a week.  You have increasing shortness of breath that is unusual for you.  You are unable to participate in your usual physical activities.  You tire easily.  You cough more than normal, especially with physical activity.  You have any or more swelling in areas such as your hands, feet, ankles, or abdomen.  You are unable to sleep because it is hard to breathe.  You feel like your heart is beating fast (palpitations).  You become dizzy or light-headed upon standing up. SEEK IMMEDIATE MEDICAL CARE IF:   You have difficulty breathing.  There is a change in mental status such as decreased alertness or difficulty with concentration.  You have a pain or discomfort in your chest.  You have an episode of fainting (syncope). MAKE SURE YOU:   Understand these instructions.  Will watch your condition.  Will get help right away if you are  not doing well or get worse. Document Released: 12/16/2005 Document Revised: 05/02/2014 Document Reviewed: 01/15/2013 [facility] Patient Information 2015 [place], [place]. This information is not intended to replace advice given to you by your health care provider. Make sure you discuss any questions you have with your health care provider.  Hyperglycemia Hyperglycemia occurs when the glucose (sugar) in your blood is too high. Hyperglycemia can happen for many reasons, but it most often happens to people who do not know they have diabetes or are not managing their diabetes properly.  CAUSES  Whether you have diabetes or not, there are other causes of hyperglycemia. Hyperglycemia can occur  when you have diabetes, but it can also occur in other situations that you might not be as aware of, such as: Diabetes  If you have diabetes and are having problems controlling your blood glucose, hyperglycemia could occur because of some of the following reasons:  Not following your meal plan.  Not taking your diabetes medications or not taking it properly.  Exercising less or doing less activity than you normally do.  Being sick. Pre-diabetes  This cannot be ignored. Before people develop Type 2 diabetes, they almost always have "pre-diabetes." This is when your blood glucose levels are higher than normal, but not yet high enough to be diagnosed as diabetes. Research has shown that some long-term damage to the body, especially the heart and circulatory system, may already be occurring during pre-diabetes. If you take action to manage your blood glucose when you have pre-diabetes, you may delay or prevent Type 2 diabetes from developing. Stress  If you have diabetes, you may be "diet" controlled or on oral medications or insulin to control your diabetes. However, you may find that your blood glucose is higher than usual in the hospital whether you have diabetes or not. This is often referred to as "stress hyperglycemia." Stress can elevate your blood glucose. This happens because of hormones put out by the body during times of stress. If stress has been the cause of your high blood glucose, it can be followed regularly by your caregiver. That way he/she can make sure your hyperglycemia does not continue to get worse or progress to diabetes. Steroids  Steroids are medications that act on the infection fighting system (immune system) to block inflammation or infection. One side effect can be a rise in blood glucose. Most people can produce enough extra insulin to allow for this rise, but for those who cannot, steroids make blood glucose levels go even higher. It is not unusual for steroid  treatments to "uncover" diabetes that is developing. It is not always possible to determine if the hyperglycemia will go away after the steroids are stopped. A special blood test called an A1c is sometimes done to determine if your blood glucose was elevated before the steroids were started. SYMPTOMS  Thirsty.  Frequent urination.  Dry mouth.  Blurred vision.  Tired or fatigue.  Weakness.  Sleepy.  Tingling in feet or leg. DIAGNOSIS  Diagnosis is made by monitoring blood glucose in one or all of the following ways:  A1c test. This is a chemical found in your blood.  Fingerstick blood glucose monitoring.  Laboratory results. TREATMENT  First, knowing the cause of the hyperglycemia is important before the hyperglycemia can be treated. Treatment may include, but is not be limited to:  Education.  Change or adjustment in medications.  Change or adjustment in meal plan.  Treatment for an illness, infection, etc.  More frequent blood  glucose monitoring.  Change in exercise plan.  Decreasing or stopping steroids.  Lifestyle changes. HOME CARE INSTRUCTIONS   Test your blood glucose as directed.  Exercise regularly. Your caregiver will give you instructions about exercise. Pre-diabetes or diabetes which comes on with stress is helped by exercising.  Eat wholesome, balanced meals. Eat often and at regular, fixed times. Your caregiver or nutritionist will give you a meal plan to guide your sugar intake.  Being at an ideal weight is important. If needed, losing as little as 10 to 15 pounds may help improve blood glucose levels. SEEK MEDICAL CARE IF:   You have questions about medicine, activity, or diet.  You continue to have symptoms (problems such as increased thirst, urination, or weight gain). SEEK IMMEDIATE MEDICAL CARE IF:   You are vomiting or have diarrhea.  Your breath smells fruity.  You are breathing faster or slower.  You are very sleepy or  incoherent.  You have numbness, tingling, or pain in your feet or hands.  You have chest pain.  Your symptoms get worse even though you have been following your caregiver's orders.  If you have any other questions or concerns. Document Released: 06/11/2001 Document Revised: 03/09/2012 Document Reviewed: 04/13/2012 [facility] Patient Information 2015 [place], [place]. This information is not intended to replace advice given to you by your health care provider. Make sure you discuss any questions you have with your health care provider.

## 2014-11-25 NOTE — ED Notes (Signed)
Dr.. And Nurse was informed of patient CBG.

## 2014-11-25 NOTE — ED Notes (Signed)
Pt. Stated, i started having some discomfort in my chest and i had a heart attack about 3 years ago.  I've been coughing but nothing coming up.  Its not pain just a discomfort.

## 2014-11-25 NOTE — ED Notes (Signed)
PT. desat on RA. This nurse applied a nasal cannula at 2L/min. [place]

## 2014-11-25 NOTE — ED Provider Notes (Signed)
Complains of cough, nonproductive onset 2-3 days ago which resolved yesterday symptoms accompanied by mild dyspnea worse with lying supine improved with sitting upright denies any chest pain. Patient ran out of his lisinopril 3 days ago. He has not taken any medications today. Cough is nonproductive no fever no other associated symptoms. On exam no distress HEENT exam normocephalic atraumatic. His membranes moist neck supple positive JVD no bruit lungs clear auscultation heart regular rate and rhythm mildly tachycardic abdomen obese nontender extremities without edema skin warm dry  Glenn SouSam Haelie Clapp, MD 11/25/14 1706

## 2014-11-25 NOTE — ED Notes (Addendum)
Darryl, EMT at the bedside placing patient on the pulse ox and ambulating patient. Patient stayed 99%-100% with HR 88-102 during ambulation. Patient comfortable. Denies SOB. PA Emily at the bedside.

## 2014-11-25 NOTE — ED Notes (Signed)
Patient O2 Sat 99% on RA with HR of 88 sitting on side of bed. Walked around nursing station  O2 Sats 99 to 100% on RA with HR 88 to 108. Patient returned to bed. [facility]

## 2014-11-25 NOTE — ED Provider Notes (Signed)
CSN: [id number]     Arrival date & time 11/25/14  [id number] History   None    Chief Complaint  Patient presents with  . Chest Pain    "chest discomfort no pain"     (Consider location/radiation/quality/duration/timing/severity/associated sxs/prior Treatment) HPI   Patient with hx CAD s/p NSTEMI 2012, EF 50%, DM, HTN, HL, obesity, presents with 3-4 days of dyspnea and increased work of breathing, worse at night and when lying flat.  Better during the day and with walking around.  Had a dry cough for the same number of days that resolved last night.  Denies fevers, other URI symptoms, chest pain, palpitations, leg swelling, nausea, lightheadedness/dizziness.  No recent immobilization, no personal or family hx blood clots. Had a friend who had penumonia recently, was worried he might have the same.  Has been out of his blood pressure medication x 5 days.   Has taken none of his home medications today.  Has been taking robitussin for his cough for the past several days.  CBG usually around 150, last checked last week.    Past Medical History  Diagnosis Date  . CAD (coronary artery disease)     NSTEMI 10/2011: LHC 11/04/11: pLAD 90%, mLAD 60-70%, small D2 sub totally occluded at ostium, small OM1 90% ostial, 90% mid, mOM2 30%, oPL 80%, RCA 30%, dPDA 80%, EF 20% with ant AK.  PCI:  Promus DES to pLAD.  . Ischemic cardiomyopathy     Echo 11/03/11: mod LVH, mild focal basal septal hypertrophy, EF 15%, grade 2 diast dysfxn, mild MR, mild to mod LAE, mild RVE, mild to mod reduced RVSF.  EF 3/5 50% by echo  . DM2 (diabetes mellitus, type 2)   . HTN (hypertension)   . Hyperlipidemia   . Chronic systolic heart failure   . Obesity    Past Surgical History  Procedure Laterality Date  . None     Family History  Problem Relation Age of Onset  . Heart disease Mother     MOTHER HAD CABG   History  Substance Use Topics  . Smoking status: Never Smoker   . Smokeless tobacco: Not on file  . Alcohol Use:  Yes     Comment: OCCASIONAL    Review of Systems  All other systems reviewed and are negative.     Allergies  Review of patient's allergies indicates no known allergies.  Home Medications   Prior to Admission medications   Medication Sig Start Date End Date Taking? Authorizing Provider  aspirin EC 325 MG tablet Take 1 tablet (325 mg total) by mouth daily. 06/02/13   Rollene RotundaJames Hochrein, MD  carvedilol (COREG) 25 MG tablet Take 1 tablet (25 mg total) by mouth 2 (two) times daily. 07/14/13   Rosalio MacadamiaLori C Gerhardt, NP  glipiZIDE (GLUCOTROL) 10 MG tablet TAKE ONE TABLET BY MOUTH TWICE DAILY BEFORE MEAL(S) 08/31/14   Rollene RotundaJames Hochrein, MD  lisinopril (PRINIVIL,ZESTRIL) 20 MG tablet TAKE TWO TABLETS BY MOUTH ONCE DAILY 10/04/14   Rollene RotundaJames Hochrein, MD  nitroGLYCERIN (NITROSTAT) 0.4 MG SL tablet Place 1 tablet (0.4 mg total) under the tongue every 5 (five) minutes x 3 doses as needed for chest pain. 11/06/11 06/02/13  Hollice EspySendil K Krishnan, MD  pravastatin (PRAVACHOL) 40 MG tablet Take 2 tablets (80 mg total) by mouth daily. 03/24/13 03/24/14  Rollene RotundaJames Hochrein, MD   BP 189/131 mmHg  Pulse 118  Temp(Src) 97.7 F (36.5 C) (Oral)  Resp 20  Ht 6\' 6"  (1.981 m)  Wt  300 lb (136.079 kg)  BMI 34.68 kg/m2  SpO2 100% Physical Exam  Constitutional: He appears well-developed and well-nourished. No distress.  HENT:  Head: Normocephalic and atraumatic.  Neck: Neck supple.  Cardiovascular: Normal rate and regular rhythm.   Pulmonary/Chest: Effort normal and breath sounds normal. No respiratory distress. He has no wheezes. He has no rales.  Abdominal: Soft. He exhibits no distension and no mass. There is no tenderness. There is no rebound and no guarding.  Musculoskeletal: He exhibits no edema or tenderness.  Neurological: He is alert. He exhibits normal muscle tone.  Skin: He is not diaphoretic.  Nursing note and vitals reviewed.   ED Course  Procedures (including critical care time) Labs Review Labs Reviewed   COMPREHENSIVE METABOLIC PANEL - Abnormal; Notable for the following:    Sodium 135 (*)    Glucose, Bld 354 (*)    BUN 24 (*)    Anion gap 16 (*)    All other components within normal limits  CBC WITH DIFFERENTIAL - Abnormal; Notable for the following:    WBC 11.0 (*)    MCV 77.6 (*)    Neutro Abs 8.4 (*)    All other components within normal limits  PRO B NATRIURETIC PEPTIDE - Abnormal; Notable for the following:    Pro B Natriuretic peptide (BNP) 1383.0 (*)    All other components within normal limits  CBG MONITORING, ED - Abnormal; Notable for the following:    Glucose-Capillary 363 (*)    All other components within normal limits  CBG MONITORING, ED - Abnormal; Notable for the following:    Glucose-Capillary 269 (*)    All other components within normal limits  TROPONIN I  CBC    Imaging Review Dg Chest 2 View  11/25/2014   CLINICAL DATA:  Shortness of breath for the past 4 days.  EXAM: CHEST  2 VIEW  COMPARISON:  11/02/2011.  FINDINGS: Stable enlarged cardiac silhouette. Clear lungs. Mild diffuse peribronchial thickening and accentuation of the interstitial markings. Unremarkable bones.  IMPRESSION: 1. Mild bronchitic changes and mild interstitial lung disease. 2. Stable cardiomegaly.   Electronically Signed   By: Gordan Payment M.D.   On: 11/25/2014 09:58     EKG Interpretation   Date/Time:  Friday November 25 2014 08:54:33 EST Ventricular Rate:  119 PR Interval:  142 QRS Duration: 106 QT Interval:  338 QTC Calculation: 475 R Axis:   70 Text Interpretation:  Sinus tachycardia Nonspecific ST and T wave  abnormality Abnormal ECG No significant change since last tracing  Confirmed by Ethelda Chick  MD, SAM [phone]) on 11/25/2014 9:07:44 AM       9:25 AM Discussed pt with Dr Ethelda Chick.   12:42 PM Patient reports he is feeling much better.  Denies any SOB.  O2 has been low but with poor wave form, does not appear to be accurate.  Plan disussed with Dr Ethelda Chick.   Clinically in mild heart failure, will start on diuretic and refill HTN medication, arrange close follow up with cardiology.   12:59 PM Discussed patient with cardiology PA Annabelle Harman) who will set up patient with appointment in the clinic early next week.  Agrees with plan.      Filed Vitals:   11/25/14 1300  BP: 127/95  Pulse: 93  Temp: 98.2 F (36.8 C)  Resp: 19    MDM   Final diagnoses:  Chronic systolic heart failure  Hyperglycemia    Pt with CAD, s/p NSTEMI, HTN, DM, HL, obesity,  off lisinopril x 4 days, with dyspnea and orthopnea, dry cough x same 4 days.  Clinically in mild heart failure.  Given PO lasix.  Pt also found to be hyperglycemic, improved with IVF (no overt fluid overload on CXR, BNP stable, no peripheral edema), pt advised to monitor CBG closely this week.  Pt to follow up with cardiology early next week.  Pt not truly hypoxic, no dyspnea while in ED.  Ambulated without SOB, maintained O2 saturation.  Denies any chest pain at any time.  Doubt ACS.  Doubt PE. Pt also seen by Dr Ethelda ChickJacubowitz who agrees with workup and plan.  D/C with lasix, lisinopril refill, cardiology follow up. Discussed result, findings, treatment, and follow up  with patient.  Pt given return precautions.  Pt verbalizes understanding and agrees with plan.        Trixie Dredgemily Shaan Rhoads, PA-C 11/25/14 1624  Doug SouSam Jacubowitz, MD 11/25/14 [phone]

## 2014-11-25 NOTE — Progress Notes (Signed)
Received notification that patient needs close f/u in office. I have sent a message to our office's scheduling inbox requesting a follow-up appointment in 1 week, and our office will call the patient with this appointment. Dayna Dunn PA-C

## 2014-11-25 NOTE — ED Notes (Signed)
PA Emily at the bedside.  

## 2014-11-28 ENCOUNTER — Telehealth: Payer: Self-pay | Admitting: Cardiology

## 2014-11-28 NOTE — Telephone Encounter (Signed)
I have talked to this pt. And he has an appt. On the 15th of dec

## 2014-11-28 NOTE — Telephone Encounter (Signed)
Went to the E/R for Fluid around his heart and that his heart was enlarged , but gave him a Furosemide 20mg  once a day and he started them on Friday . Wants to know how long does it takes for them to start working . Please call

## 2014-12-07 ENCOUNTER — Telehealth: Payer: Self-pay | Admitting: Cardiology

## 2014-12-07 ENCOUNTER — Encounter (HOSPITAL_COMMUNITY): Payer: Self-pay | Admitting: Cardiology

## 2014-12-07 MED ORDER — LISINOPRIL 20 MG PO TABS: ORAL_TABLET | ORAL | Status: DC

## 2014-12-07 NOTE — Telephone Encounter (Signed)
[name] is calling because he is about to run out of his Lisinopril and it says no refills . [facility] [address] .[name].    Thanks

## 2014-12-07 NOTE — Telephone Encounter (Signed)
Returned call to patient Lisinopril refill sent to pharmacy.Advised to keep appointment with Dr.Hochrein 12/13/14 at 2:45 pm.

## 2014-12-13 ENCOUNTER — Ambulatory Visit (INDEPENDENT_AMBULATORY_CARE_PROVIDER_SITE_OTHER): Payer: Self-pay | Admitting: Cardiology

## 2014-12-13 ENCOUNTER — Encounter: Payer: Self-pay | Admitting: Cardiology

## 2014-12-13 VITALS — BP 189/136 | HR 96 | Ht 78.0 in | Wt 290.0 lb

## 2014-12-13 DIAGNOSIS — I10 Essential (primary) hypertension: Secondary | ICD-10-CM

## 2014-12-13 DIAGNOSIS — I2510 Atherosclerotic heart disease of native coronary artery without angina pectoris: Secondary | ICD-10-CM

## 2014-12-13 MED ORDER — SPIRONOLACTONE 25 MG PO TABS: 25.0000 mg | ORAL_TABLET | Freq: Every day | ORAL | Status: DC

## 2014-12-13 NOTE — Patient Instructions (Signed)
Your physician recommends that you schedule a follow-up appointment in: one month with Dr. Antoine PocheHochrein  Have your lab done today then we will repeat that same lab in one week then once a month for three months then every three months there after  Start taking spironolactone as directed

## 2014-12-13 NOTE — Progress Notes (Signed)
HPI The patient presents for follow up of CAD and cardiomyopathy.  He was in the ED recently with SOB and I reviewed these records.  He had a mildly elevated BNP.  He was restarted on Lasix.   He says he feels better since that time. He is not now having any PND or orthopnea. He's not having any palpitations, presyncope or syncope. He was having some discomfort previously been felt like a "gas bubble". However, he hasn't had this restarting the Lasix.   He says he had been out of his medicines for about a week.    No Known Allergies  Current Outpatient Prescriptions  Medication Sig Dispense Refill  . aspirin EC 325 MG tablet Take 1 tablet (325 mg total) by mouth daily. 30 tablet 0  . carvedilol (COREG) 25 MG tablet Take 1 tablet (25 mg total) by mouth 2 (two) times daily. 180 tablet 3  . furosemide (LASIX) 20 MG tablet Take 1 tablet (20 mg total) by mouth daily. 30 tablet 0  . glipiZIDE (GLUCOTROL) 10 MG tablet TAKE ONE TABLET BY MOUTH TWICE DAILY BEFORE MEAL(S) 60 tablet 0  . lisinopril (PRINIVIL,ZESTRIL) 20 MG tablet TAKE TWO TABLETS BY MOUTH ONCE DAILY 14 tablet 0  . nitroGLYCERIN (NITROSTAT) 0.4 MG SL tablet Place 1 tablet (0.4 mg total) under the tongue every 5 (five) minutes x 3 doses as needed for chest pain. 20 tablet 1   No current facility-administered medications for this visit.    Past Medical History  Diagnosis Date  . CAD (coronary artery disease)     NSTEMI 10/2011: LHC 11/04/11: pLAD 90%, mLAD 60-70%, small D2 sub totally occluded at ostium, small OM1 90% ostial, 90% mid, mOM2 30%, oPL 80%, RCA 30%, dPDA 80%, EF 20% with ant AK.  PCI:  Promus DES to pLAD.  . Ischemic cardiomyopathy     Echo 11/03/11: mod LVH, mild focal basal septal hypertrophy, EF 15%, grade 2 diast dysfxn, mild MR, mild to mod LAE, mild RVE, mild to mod reduced RVSF.  EF 3/5 50% by echo  . DM2 (diabetes mellitus, type 2)   . HTN (hypertension)   . Hyperlipidemia   . Chronic systolic heart failure   .  Obesity     Past Surgical History  Procedure Laterality Date  . None    . Left and right heart catheterization with coronary angiogram N/A 11/04/2011    Procedure: LEFT AND RIGHT HEART CATHETERIZATION WITH CORONARY ANGIOGRAM;  Surgeon: Rollene RotundaJames Donterius Filley, MD;  [facility];  Service: Cardiovascular;  Laterality: N/A;  . Percutaneous coronary stent intervention (pci-s)  11/04/2011    Procedure: PERCUTANEOUS CORONARY STENT INTERVENTION (PCI-S);  Surgeon: Tonny BollmanMichael Cooper, MD;  [facility];  Service: Cardiovascular;;    ROS:  As stated in the HPI and negative for all other systems.  PHYSICAL EXAM BP 189/136 mmHg  Pulse 96  Ht 6\' 6"  (1.981 m)  Wt 290 lb (131.543 kg)  BMI 33.52 kg/m2 GENERAL:  Well appearing HEENT:  Pupils equal round and reactive, fundi not visualized, oral mucosa unremarkable NECK:  No jugular venous distention, waveform within normal limits, carotid upstroke brisk and symmetric, no bruits, no thyromegaly LYMPHATICS:  No cervical, inguinal adenopathy LUNGS:  Clear to auscultation bilaterally BACK:  No CVA tenderness CHEST:  Unremarkable HEART:  PMI not displaced or sustained,S1 and S2 within normal limits, positive S3, no S4, no clicks, no rubs, no murmurs ABD:  Flat, positive bowel sounds normal in frequency in pitch,  no bruits, no rebound, no guarding, no midline pulsatile mass, no hepatomegaly, no splenomegaly EXT:  2 plus pulses throughout, mild leg edema, no cyanosis no clubbing SKIN:  No rashes no nodules NEURO:  Cranial nerves II through XII grossly intact, motor grossly intact throughout PSYCH:  Cognitively intact, oriented to person place and time   ASSESSMENT AND PLAN  CAD:  The patient has no new sypmtoms.  No further cardiovascular testing is indicated.  We will continue with aggressive risk reduction and meds as listed.  I will consider an ischemia work up in the future as he was having some chest pain.   CARDIOMYOPATHY:  I suspect that the  EF is low again.  I will check a BMET today.  I will start spironolactone.  We will repeat an echo.    (Check potassium levels and  renal function 3--4 days and 1 week, then at least monthly for first 3 months and every 3 months thereafter after initiation of spironolactone.)  I suspect that noncompliance with meds and lifestyle has caught up with him  OBESITY:  He says he started exercising and gained weight.    HTN:  This is being managed in the context of treating his CHF.  HEALTH MAINTENANCE:  I will check an A1c.

## 2014-12-15 ENCOUNTER — Telehealth: Payer: Self-pay | Admitting: Cardiology

## 2014-12-15 NOTE — Telephone Encounter (Signed)
Spoke with Belenda CruiseKristin - OK to take claritin or zyrtec OTC (nothing with -D or -DM .[name]. As long as he doesn't have to sign his name for it, its OK). Patient voiced understanding of these recommendations.

## 2014-12-15 NOTE — Telephone Encounter (Signed)
New Prob   Pt states postnasal drip and requesting some recommendations for OTC medications. Please call.

## 2014-12-16 ENCOUNTER — Other Ambulatory Visit: Payer: Self-pay | Admitting: Cardiology

## 2014-12-26 ENCOUNTER — Telehealth: Payer: Self-pay | Admitting: Cardiology

## 2014-12-26 MED ORDER — GLIPIZIDE 10 MG PO TABS: 10.0000 mg | ORAL_TABLET | Freq: Two times a day (BID) | ORAL | Status: DC

## 2014-12-26 MED ORDER — FUROSEMIDE 20 MG PO TABS: 20.0000 mg | ORAL_TABLET | Freq: Every day | ORAL | Status: DC

## 2014-12-26 MED ORDER — SPIRONOLACTONE 25 MG PO TABS: 25.0000 mg | ORAL_TABLET | Freq: Every day | ORAL | Status: DC

## 2014-12-26 NOTE — Telephone Encounter (Signed)
Communicated Kristin's recommendations to patient, Pt voiced understanding, no other stated concerns at this time.

## 2014-12-26 NOTE — Telephone Encounter (Signed)
Pt called in stating that he is out of his Furosemide and Spironolactone and has a couple of days left on his [facility] [address].   Thanks

## 2014-12-26 NOTE — Telephone Encounter (Signed)
Meds refilled, pt informed via telephone. Voiced understanding.

## 2014-12-26 NOTE — Telephone Encounter (Signed)
Pt called in stating that Dr. Antoine PocheHochrein prescribed a diabetic robitussin for him to take and he is having some problems with some phlem that will not break up in his chest and would like to know if there is another medication he could take in the place of this.  Thanks

## 2014-12-26 NOTE — Telephone Encounter (Signed)
DiabeticTussin is available OTC.  Avoid D or DM.  Can also use plain Mucinex (they both are just guaifenesin) so don't have to worry about sugar content or taste.

## 2014-12-26 NOTE — Telephone Encounter (Signed)
Spoke to this patient this morning to notify him I'd completed his refill request. Confirmed with him that based on last telephone encounter he'd been advised not to take anything with -D or -DM. He wanted something like Mucinex but I told him I'm not sure if there's a form that is safe based on potential med interactions. Will rt to [place] for advice.

## 2015-01-06 ENCOUNTER — Ambulatory Visit (HOSPITAL_COMMUNITY): Payer: Self-pay

## 2015-01-13 ENCOUNTER — Ambulatory Visit: Payer: Self-pay | Admitting: Cardiology

## 2015-01-13 ENCOUNTER — Ambulatory Visit (HOSPITAL_COMMUNITY): Payer: Self-pay

## 2015-01-17 ENCOUNTER — Other Ambulatory Visit: Payer: Self-pay

## 2015-01-17 ENCOUNTER — Other Ambulatory Visit: Payer: Self-pay | Admitting: Cardiology

## 2015-01-17 MED ORDER — CARVEDILOL 25 MG PO TABS: 25.0000 mg | ORAL_TABLET | Freq: Two times a day (BID) | ORAL | Status: DC

## 2015-01-23 ENCOUNTER — Other Ambulatory Visit: Payer: Self-pay | Admitting: *Deleted

## 2015-01-23 DIAGNOSIS — E878 Other disorders of electrolyte and fluid balance, not elsewhere classified: Secondary | ICD-10-CM

## 2015-01-25 ENCOUNTER — Other Ambulatory Visit: Payer: Self-pay | Admitting: *Deleted

## 2015-01-25 ENCOUNTER — Telehealth: Payer: Self-pay | Admitting: Cardiology

## 2015-01-25 NOTE — Telephone Encounter (Signed)
Pt. Called and informed that Dr. Antoine PocheHochrein Dictated 25 mg bid, pt. Stated understanding of instructions

## 2015-01-25 NOTE — Telephone Encounter (Signed)
New Message         Pt calling stating that when he recently picked up his rx from the pharmacy he noticed that the mgs had been increased. Pt stating that it is for his beta blocker and he was taking 2 tabs of 12.5mg  per day and now it is 2 tabs of 25mg  per day. Pt wants to know if this is correct or if it is a mistake. Pt also states that since he has been taking the new dosage his eyes feeling irritated and wants to know if that could be a side effect. Please call back and advise.

## 2015-01-27 ENCOUNTER — Ambulatory Visit (HOSPITAL_COMMUNITY)
Admission: RE | Admit: 2015-01-27 | Discharge: 2015-01-27 | Disposition: A | Discharge status: Home / Self Care | Payer: Self-pay | Source: Ambulatory Visit | Attending: Internal Medicine | Admitting: Internal Medicine

## 2015-01-27 DIAGNOSIS — I255 Ischemic cardiomyopathy: Secondary | ICD-10-CM | POA: Insufficient documentation

## 2015-01-27 DIAGNOSIS — I10 Essential (primary) hypertension: Secondary | ICD-10-CM | POA: Insufficient documentation

## 2015-01-27 DIAGNOSIS — E119 Type 2 diabetes mellitus without complications: Secondary | ICD-10-CM | POA: Insufficient documentation

## 2015-01-27 DIAGNOSIS — I2510 Atherosclerotic heart disease of native coronary artery without angina pectoris: Secondary | ICD-10-CM

## 2015-01-27 DIAGNOSIS — E785 Hyperlipidemia, unspecified: Secondary | ICD-10-CM | POA: Insufficient documentation

## 2015-01-27 NOTE — Progress Notes (Signed)
2D Echocardiogram Complete.  01/27/2015   Lottie Sigman, RDCS  

## 2015-02-01 ENCOUNTER — Other Ambulatory Visit: Payer: Self-pay | Admitting: *Deleted

## 2015-02-01 ENCOUNTER — Ambulatory Visit: Payer: Self-pay | Admitting: Cardiology

## 2015-02-01 DIAGNOSIS — E878 Other disorders of electrolyte and fluid balance, not elsewhere classified: Secondary | ICD-10-CM

## 2015-02-17 ENCOUNTER — Encounter: Payer: Self-pay | Admitting: Cardiology

## 2015-02-17 ENCOUNTER — Ambulatory Visit (INDEPENDENT_AMBULATORY_CARE_PROVIDER_SITE_OTHER): Payer: Self-pay | Admitting: Cardiology

## 2015-02-17 VITALS — BP 170/120 | HR 74 | Ht 78.0 in | Wt 289.0 lb

## 2015-02-17 DIAGNOSIS — E878 Other disorders of electrolyte and fluid balance, not elsewhere classified: Secondary | ICD-10-CM

## 2015-02-17 MED ORDER — SPIRONOLACTONE 50 MG PO TABS: 50.0000 mg | ORAL_TABLET | Freq: Every day | ORAL | Status: DC

## 2015-02-17 NOTE — Patient Instructions (Signed)
Your physician recommends that you schedule a follow-up appointment in: 2 weeks with an extender  We have increased your spironolactone to 50 mg daily  We want you to get your labs done in 10 days

## 2015-02-17 NOTE — Progress Notes (Signed)
HPI The patient presents for follow up of CAD and cardiomyopathy.  He was in the ED recently with SOB and I saw him after this.  I suspect that his ejection fraction which had been 15% years ago and back to 50% was now lower. In fact echo did demonstrate this and his EF is now about 35-40%. He had been out of his medicines and his blood pressure is up. I restarted beta blockers added spironolactone. He had been restarted on Lasix. He now feels much better. He denies any cardiovascular symptoms. The patient denies any new symptoms such as chest discomfort, neck or arm discomfort. There has been no new shortness of breath, PND or orthopnea. There have been no reported palpitations, presyncope or syncope.  No Known Allergies  Current Outpatient Prescriptions  Medication Sig Dispense Refill  . aspirin EC 325 MG tablet Take 1 tablet (325 mg total) by mouth daily. 30 tablet 0  . carvedilol (COREG) 25 MG tablet Take 1 tablet (25 mg total) by mouth 2 (two) times daily. 180 tablet 3  . furosemide (LASIX) 20 MG tablet Take 1 tablet (20 mg total) by mouth daily. 90 tablet 3  . glipiZIDE (GLUCOTROL) 10 MG tablet Take 1 tablet (10 mg total) by mouth 2 (two) times daily before a meal. 180 tablet 3  . lisinopril (PRINIVIL,ZESTRIL) 20 MG tablet TAKE TWO TABLETS BY MOUTH ONCE DAILY 180 tablet 3  . spironolactone (ALDACTONE) 25 MG tablet Take 1 tablet (25 mg total) by mouth daily. 90 tablet 3  . nitroGLYCERIN (NITROSTAT) 0.4 MG SL tablet Place 1 tablet (0.4 mg total) under the tongue every 5 (five) minutes x 3 doses as needed for chest pain. 20 tablet 1   No current facility-administered medications for this visit.    Past Medical History  Diagnosis Date  . CAD (coronary artery disease)     NSTEMI 10/2011: LHC 11/04/11: pLAD 90%, mLAD 60-70%, small D2 sub totally occluded at ostium, small OM1 90% ostial, 90% mid, mOM2 30%, oPL 80%, RCA 30%, dPDA 80%, EF 20% with ant AK.  PCI:  Promus DES to pLAD.  . Ischemic  cardiomyopathy     Echo 11/03/11: mod LVH, mild focal basal septal hypertrophy, EF 15%, grade 2 diast dysfxn, mild MR, mild to mod LAE, mild RVE, mild to mod reduced RVSF.  EF 3/5 50% by echo  . DM2 (diabetes mellitus, type 2)   . HTN (hypertension)   . Hyperlipidemia   . Chronic systolic heart failure   . Obesity     Past Surgical History  Procedure Laterality Date  . None    . Left and right heart catheterization with coronary angiogram N/A 11/04/2011    Procedure: LEFT AND RIGHT HEART CATHETERIZATION WITH CORONARY ANGIOGRAM;  Surgeon: Rollene RotundaJames Juanna Pudlo, MD;  [facility];  Service: Cardiovascular;  Laterality: N/A;  . Percutaneous coronary stent intervention (pci-s)  11/04/2011    Procedure: PERCUTANEOUS CORONARY STENT INTERVENTION (PCI-S);  Surgeon: Tonny BollmanMichael Cooper, MD;  [facility];  Service: Cardiovascular;;    ROS:  As stated in the HPI and negative for all other systems.  PHYSICAL EXAM BP 170/120 mmHg  Pulse 74  Ht 6\' 6"  (1.981 m)  Wt 289 lb (131.09 kg)  BMI 33.40 kg/m2 GENERAL:  Well appearing NECK:  No jugular venous distention, waveform within normal limits, carotid upstroke brisk and symmetric, no bruits, no thyromegaly LYMPHATICS:  No cervical, inguinal adenopathy LUNGS:  Clear to auscultation bilaterally BACK:  No  CVA tenderness CHEST:  Unremarkable HEART:  PMI not displaced or sustained,S1 and S2 within normal limits, positive S3, no S4, no clicks, no rubs, no murmurs ABD:  Flat, positive bowel sounds normal in frequency in pitch, no bruits, no rebound, no guarding, no midline pulsatile mass, no hepatomegaly, no splenomegaly EXT:  2 plus pulses throughout, mild leg edema, no cyanosis no clubbing  EKG:  Sinus rhythm, rate 82, axis within normal limits, intervals within normal limits, no acute ST-T wave changes.  02/17/2015  ASSESSMENT AND PLAN  CAD:  The patient has no new sypmtoms.  No further cardiovascular testing is indicated.  We will continue  with aggressive risk reduction and meds as listed.  I will consider an ischemia work up in the future after his BP is checked.  For now he denies any pain.    CARDIOMYOPATHY:  I'm going to increase his spironolactone and check a basic metabolic profile in 10 days. His EF is now back down to 35-40% probably because of noncompliance with lifestyle and medications.  OBESITY:  I gave him a weight loss goal.  HTN:  This is being managed in the context of treating his CHF.  He will likely need further med titration. I would add Norvasc if his blood pressure remains elevated.  HEALTH MAINTENANCE:  He was to get an A1c but he did not have this drawn.  I will reorder this.

## 2015-03-02 ENCOUNTER — Telehealth: Payer: Self-pay | Admitting: Cardiology

## 2015-03-02 NOTE — Telephone Encounter (Signed)
Pt lost his lab order,would you please fax downstairs.Pt is coming in today.

## 2015-03-02 NOTE — Telephone Encounter (Signed)
Called patient to inform him that labs have been released to [facility] and he can just walk in and they can look him up in their system. He voiced understanding

## 2015-03-03 ENCOUNTER — Encounter: Payer: Self-pay | Admitting: Physician Assistant

## 2015-03-03 ENCOUNTER — Ambulatory Visit (INDEPENDENT_AMBULATORY_CARE_PROVIDER_SITE_OTHER): Payer: Self-pay | Admitting: Physician Assistant

## 2015-03-03 VITALS — BP 168/118 | HR 92 | Ht 78.0 in | Wt 292.2 lb

## 2015-03-03 DIAGNOSIS — E669 Obesity, unspecified: Secondary | ICD-10-CM

## 2015-03-03 DIAGNOSIS — I10 Essential (primary) hypertension: Secondary | ICD-10-CM

## 2015-03-03 DIAGNOSIS — I255 Ischemic cardiomyopathy: Secondary | ICD-10-CM

## 2015-03-03 LAB — BASIC METABOLIC PANEL
BUN: 32 mg/dL — ABNORMAL HIGH (ref 6–23)
CO2: 28 mEq/L (ref 19–32)
Calcium: 9.6 mg/dL (ref 8.4–10.5)
Chloride: 102 mEq/L (ref 96–112)
Creat: 1.27 mg/dL (ref 0.50–1.35)
Glucose, Bld: 107 mg/dL — ABNORMAL HIGH (ref 70–99)
POTASSIUM: 4.1 meq/L (ref 3.5–5.3)
SODIUM: 138 meq/L (ref 135–145)

## 2015-03-03 LAB — HEMOGLOBIN A1C
Hgb A1c MFr Bld: 8 % — ABNORMAL HIGH (ref <5.7)
Mean Plasma Glucose: 183 mg/dL — ABNORMAL HIGH (ref <117)

## 2015-03-03 MED ORDER — AMLODIPINE BESYLATE 5 MG PO TABS: 5.0000 mg | ORAL_TABLET | Freq: Every day | ORAL | Status: DC

## 2015-03-03 NOTE — Progress Notes (Signed)
Patient ID: [name], male   DOB: 06-Apr-1971, 44 y.o.   MRN: [id number]    Date:  03/03/2015   ID:  [name], DOB 06-Apr-1971, MRN [id number]  PCP:  No PCP Per Patient  Primary Cardiologist:  Hochrein  Chief Complaint  Patient presents with  . 2 week rov    follow up on blood pressure.     History of Present Illness: [name] is a 44 y.o. male  The patient presents for follow up of CAD and cardiomyopathy. He was in the ED recently with SOB. His ejection fraction, which had been 15% years ago and back to 50% was now lower. In fact echo did demonstrate this and his EF is now about 35-40%. He had been out of his medicines and his blood pressure was up.  He was restarted on beta blockers added spironolactone which was than increased at the last OV.  He had been restarted on Lasix.  The patient presents for follow-up.  He reports doing quite well. He currently denies nausea, vomiting, fever, chest pain, shortness of breath, orthopnea, dizziness, PND, cough, congestion, abdominal pain, hematochezia, melena, lower extremity edema, claudication.  He is on his feet all day working at [employer] but he is not actually doing organized exercise.  Does report watching what he eats and is trying to adhere to a low-sodium diet.  Wt Readings from Last 3 Encounters:  03/03/15 292 lb 3.2 oz (132.541 kg)  02/17/15 289 lb (131.09 kg)  12/13/14 290 lb (131.543 kg)     Past Medical History  Diagnosis Date  . CAD (coronary artery disease)     NSTEMI 10/2011: LHC 11/04/11: pLAD 90%, mLAD 60-70%, small D2 sub totally occluded at ostium, small OM1 90% ostial, 90% mid, mOM2 30%, oPL 80%, RCA 30%, dPDA 80%, EF 20% with ant AK.  PCI:  Promus DES to pLAD.  . Ischemic cardiomyopathy     Echo 11/03/11: mod LVH, mild focal basal septal hypertrophy, EF 15%, grade 2 diast dysfxn, mild MR, mild to mod LAE, mild RVE, mild to mod reduced RVSF.  EF 3/5 50% by echo  . DM2 (diabetes mellitus,  type 2)   . HTN (hypertension)   . Hyperlipidemia   . Chronic systolic heart failure   . Obesity     Current Outpatient Prescriptions  Medication Sig Dispense Refill  . amLODipine (NORVASC) 5 MG tablet Take 1 tablet (5 mg total) by mouth daily. 30 tablet 6  . aspirin EC 325 MG tablet Take 1 tablet (325 mg total) by mouth daily. 30 tablet 0  . carvedilol (COREG) 25 MG tablet Take 1 tablet (25 mg total) by mouth 2 (two) times daily. 180 tablet 3  . furosemide (LASIX) 20 MG tablet Take 1 tablet (20 mg total) by mouth daily. 90 tablet 3  . glipiZIDE (GLUCOTROL) 10 MG tablet Take 1 tablet (10 mg total) by mouth 2 (two) times daily before a meal. 180 tablet 3  . lisinopril (PRINIVIL,ZESTRIL) 20 MG tablet TAKE TWO TABLETS BY MOUTH ONCE DAILY 180 tablet 3  . nitroGLYCERIN (NITROSTAT) 0.4 MG SL tablet Place 1 tablet (0.4 mg total) under the tongue every 5 (five) minutes x 3 doses as needed for chest pain. 20 tablet 1  . spironolactone (ALDACTONE) 50 MG tablet Take 1 tablet (50 mg total) by mouth daily. 90 tablet 3   No current facility-administered medications for this visit.    Allergies:   No Known Allergies  Social History:  The patient  reports that he has never smoked. He does not have any smokeless tobacco history on file. He reports that he drinks alcohol. He reports that he does not use illicit drugs.   Family history:   Family History  Problem Relation Age of Onset  . Heart disease Mother     MOTHER HAD CABG    ROS:  Please see the history of present illness.  All other systems reviewed and negative.   PHYSICAL EXAM: VS:  BP 168/118 mmHg  Pulse 92  Ht  (1.981 m)  Wt 292 lb 3.2 oz (132.541 kg)  BMI 33.77 kg/m2 obese, well developed, in no acute distress HEENT: Pupils are equal round react to light accommodation extraocular movements are intact.  Neck: No cervical lymphadenopathy. Cardiac: Regular rate and rhythm without murmurs rubs or gallops. Lungs:  clear to  auscultation bilaterally, no wheezing, rhonchi or rales Abd: soft, nontender, positive bowel sounds all quadrants, no hepatosplenomegaly Ext: no lower extremity edema.  2+ radial and dorsalis pedis pulses. Skin: warm and dry Neuro:  Grossly normal    ASSESSMENT AND PLAN:  Problem List Items Addressed This Visit    Obesity    We discussed low carb diet and daily exercise.      Ischemic cardiomyopathy - Primary (Chronic)    Patient appears euvolemic. We did discuss low-sodium diet and weight monitoring. Says he checks it about 3 times a week always at work.      Relevant Medications   amLODIpine (NORVASC) tablet   Essential hypertension, benign    Blood pressure still poorly controlled. I will add amlodipine 5 milligrams daily. We'll have him come back for blood pressure check with our pharmacist in 2 weeks. He still has room to go up on his lisinopril as well.      Relevant Medications   amLODIpine (NORVASC) tablet

## 2015-03-03 NOTE — Patient Instructions (Signed)
Your physician has recommended you make the following change in your medication: start new prescription for amlodipine. This has already been sent to your pharmacy.  Your physician recommends that you schedule a follow-up appointment in: 2 weeks with kristen for blood pressure.

## 2015-03-03 NOTE — Assessment & Plan Note (Signed)
Blood pressure still poorly controlled. I will add amlodipine 5 milligrams daily. We'll have him come back for blood pressure check with our pharmacist in 2 weeks. He still has room to go up on his lisinopril as well.

## 2015-03-03 NOTE — Assessment & Plan Note (Signed)
Patient appears euvolemic. We did discuss low-sodium diet and weight monitoring. Says he checks it about 3 times a week always at work.

## 2015-03-03 NOTE — Assessment & Plan Note (Signed)
We discussed low carb diet and daily exercise.

## 2015-03-17 ENCOUNTER — Ambulatory Visit: Payer: Self-pay | Admitting: Pharmacist Clinician (PhC)/ Clinical Pharmacy Specialist

## 2015-03-31 ENCOUNTER — Ambulatory Visit (INDEPENDENT_AMBULATORY_CARE_PROVIDER_SITE_OTHER): Payer: Self-pay | Admitting: Pharmacist Clinician (PhC)/ Clinical Pharmacy Specialist

## 2015-03-31 ENCOUNTER — Encounter: Payer: Self-pay | Admitting: Pharmacist Clinician (PhC)/ Clinical Pharmacy Specialist

## 2015-03-31 VITALS — BP 142/110 | HR 76 | Ht 78.0 in | Wt 298.0 lb

## 2015-03-31 DIAGNOSIS — I10 Essential (primary) hypertension: Secondary | ICD-10-CM

## 2015-03-31 MED ORDER — AMLODIPINE BESYLATE 10 MG PO TABS: 10.0000 mg | ORAL_TABLET | Freq: Every day | ORAL | Status: DC

## 2015-03-31 NOTE — Patient Instructions (Signed)
Return for a a follow up appointment in 1 month  Your blood pressure today is 142/110  (goal is about 130/80)  Check your blood pressure at home daily (if able) and keep record of the readings.  Take your BP meds as follows: increase amlodipine to 10 mg daily, cut spironolactone to 12.5 mg daily, continue all other meds  Bring all of your meds, your BP cuff and your record of home blood pressures to your next appointment.  Exercise as you're able, try to walk approximately 30 minutes per day.  Keep salt intake to a minimum, especially watch canned and prepared boxed foods.  Eat more fresh fruits and vegetables and fewer canned items.  Avoid eating in fast food restaurants.    HOW TO TAKE YOUR BLOOD PRESSURE: . Rest 5 minutes before taking your blood pressure. .  Don't smoke or drink caffeinated beverages for at least 30 minutes before. . Take your blood pressure before (not after) you eat. . Sit comfortably with your back supported and both feet on the floor (don't cross your legs). . Elevate your arm to heart level on a table or a desk. . Use the proper sized cuff. It should fit smoothly and snugly around your bare upper arm. There should be enough room to slip a fingertip under the cuff. The bottom edge of the cuff should be 1 inch above the crease of the elbow. . Ideally, take 3 measurements at one sitting and record the average.

## 2015-03-31 NOTE — Assessment & Plan Note (Signed)
Today his systolic pressure is improved, but diastolic remains high.  I am going to increase the amlodipine to 10 mg daily and have asked that he try taking 12.5 mg daily of the spironolactone.  If this low dose spironolactone continues to cause him eye problems, he can discontinue.  I will see him back in 1 month for follow up.  Urged him to continue with walking daily as well as maintaining a healthy diet.

## 2015-03-31 NOTE — Progress Notes (Signed)
     03/31/2015 [name] 03-24-1971 [id number]   HPI:  Glenn McgeeHarold Cassedy is a 44 y.o. male patient of Dr [place] LionsHochrien, with a PMH below who presents today for hypertension clinic evaluation.  He recently stopped taking his spironolactone due to his eyes burning.  He first noticed this when he started with then 25 mg dose and it worsened when increased to 50 mg daily.  Forgot his medication recently when out of town for several days and immediately noticed improvement.  Restarted med and symptoms returned.  Today he has been off the medication since Tuesday.    Cardiac Hx: NSTEMI in 2012 at age 44, low EF 35-40%  Family Hx: mom had CABG x 3 while in her late 6450s, doing well now  Social Hx: does not drink alcohol or caffeine, no tobacco  Diet: does eat most meals out, although has been trying to make healthier choices since November.  A1c down from high of 13 to 8.0 most recently.  Avoids burgers, eats more grilled foods  Exercise: trying to walk more  Home BP readings: mostly 150/90 ranger per patient, has home cuff as well as CMA friend who checks with sphygmomanometer.  States pressure has always fluctuated greatly, even within 1-2 hours.     Current antihypertensive medications: lisinopril 20 mg bid, amlodipine 5 mg qd, carvedilol 25 mg bid   Current Outpatient Prescriptions  Medication Sig Dispense Refill  . amLODipine (NORVASC) 5 MG tablet Take 1 tablet (5 mg total) by mouth daily. 30 tablet 6  . aspirin EC 325 MG tablet Take 1 tablet (325 mg total) by mouth daily. 30 tablet 0  . carvedilol (COREG) 25 MG tablet Take 1 tablet (25 mg total) by mouth 2 (two) times daily. 180 tablet 3  . furosemide (LASIX) 20 MG tablet Take 1 tablet (20 mg total) by mouth daily. 90 tablet 3  . glipiZIDE (GLUCOTROL) 10 MG tablet Take 1 tablet (10 mg total) by mouth 2 (two) times daily before a meal. 180 tablet 3  . lisinopril (PRINIVIL,ZESTRIL) 20 MG tablet TAKE TWO TABLETS BY MOUTH ONCE DAILY 180  tablet 3  . nitroGLYCERIN (NITROSTAT) 0.4 MG SL tablet Place 1 tablet (0.4 mg total) under the tongue every 5 (five) minutes x 3 doses as needed for chest pain. 20 tablet 1  . spironolactone (ALDACTONE) 50 MG tablet Take 1 tablet (50 mg total) by mouth daily. 90 tablet 3   No current facility-administered medications for this visit.    No Known Allergies  Past Medical History  Diagnosis Date  . CAD (coronary artery disease)     NSTEMI 10/2011: LHC 11/04/11: pLAD 90%, mLAD 60-70%, small D2 sub totally occluded at ostium, small OM1 90% ostial, 90% mid, mOM2 30%, oPL 80%, RCA 30%, dPDA 80%, EF 20% with ant AK.  PCI:  Promus DES to pLAD.  . Ischemic cardiomyopathy     Echo 11/03/11: mod LVH, mild focal basal septal hypertrophy, EF 15%, grade 2 diast dysfxn, mild MR, mild to mod LAE, mild RVE, mild to mod reduced RVSF.  EF 3/5 50% by echo  . DM2 (diabetes mellitus, type 2)   . HTN (hypertension)   . Hyperlipidemia   . Chronic systolic heart failure   . Obesity     Blood pressure 142/110, pulse 76, height 6\' 6"  (1.981 m), weight 298 lb (135.172 kg).    Phillips HayKristin Baraa Tubbs PharmD CPP [facility]

## 2015-05-05 ENCOUNTER — Ambulatory Visit: Payer: Self-pay | Admitting: Pharmacist Clinician (PhC)/ Clinical Pharmacy Specialist

## 2015-10-25 ENCOUNTER — Telehealth: Payer: Self-pay | Admitting: Cardiology

## 2015-10-25 NOTE — Telephone Encounter (Signed)
Pt advised on recommendations. Ok to f/u on Nov 8th, will call sooner if further concerns.

## 2015-10-25 NOTE — Telephone Encounter (Signed)
He can move up his appt with me if we can find a spot.  I think I have opened up some availability.

## 2015-10-25 NOTE — Telephone Encounter (Signed)
Called and spoke to patient. Patient had called to schedule appt w/ Dr. Antoine [name]. Will come in on 11/8.  He reports 2 separate problems: 1. He is having chest pressure following meals.   -has occurred for about 1 week  -has sense of fullness or pressure for 30-45 minutes after eating - "feels like i need to burp"  -resolves/improves w/ chewable antacid.  States no concern about this. Advised probably reflux related but would inform physician of issue.  2. Pt states sense of fatigue sometimes, esp noticed "after walking 200-300 yards" the other day.  -he's not having SOB w/ this. No CP on exertion either.  -thinks could be related to recent cold/viral illness symptoms he's had.  -does not have recent BP or HR readings to report.  Pt reports he is taking all medications as instructed, with exception of Spironolactone. He had a SE with this - eye itching. [name] addressed this a few months ago. He reports taking a break from medication and restarting. Eventually SE returned. He has not taken Spironolactone in 2-3 months. Advised being off the medication may be contributing to fatigue. Would defer to physician or pharmacy for alternative medication.

## 2015-10-25 NOTE — Telephone Encounter (Signed)
New message  Pt c/o of Chest Pain: 1. Are you having CP right now? Chest pain. Primarily when he eats. Its not severe.  2. Are you experiencing any other symptoms (ex. SOB, nausea, vomiting, sweating)? No  3. How long have you been experiencing CP? 1 week 4. Is your CP continuous or coming and going? Coming and going.  5. Have you taken Nitroglycerin? No  6. Comments: When he takes his medication he gets a little weak feeling.

## 2015-10-25 NOTE — Telephone Encounter (Signed)
Sounds like heartburn.  Would suggest smaller meals with little/no fatty foods.  These can make it worse.  Can try OTC Zantac or Prilosec to see if resolves

## 2015-11-03 ENCOUNTER — Telehealth: Payer: Self-pay | Admitting: Cardiology

## 2015-11-03 NOTE — Telephone Encounter (Signed)
Spoke to patient He states he has not been on medication about 3-4 months. He states he spoke with Belenda CruiseKristin about the issue and patient re-tried medication. The light still irritated his eyes,so he stopped. Patient wanted to know if it was another medication he need to try prior to office visit. His appointment was moved out  To 12/01/15 with Dr Antoine PocheHochrein Patient aware will defer to Dr Antoine PocheHochrein and Roney MarionKristin-pharm

## 2015-11-03 NOTE — Telephone Encounter (Signed)
[name] states that the spironolactone has been burning his eyes and he is not taking it . Want to know is there is something else that he needs to be taking . His appt is not until 12/01/15 at 3pm . Please call  Thanks

## 2015-11-03 NOTE — Telephone Encounter (Signed)
Spoke with patient, he states home BP readings usually run close to 140/90.  Advised he just continue with his current regimen until appointment in December.  Pt voiced understanding.

## 2015-11-07 ENCOUNTER — Ambulatory Visit: Payer: Self-pay | Admitting: Cardiology

## 2015-12-01 ENCOUNTER — Ambulatory Visit: Payer: Self-pay | Admitting: Cardiology

## 2016-01-04 ENCOUNTER — Telehealth: Payer: Self-pay | Admitting: Cardiology

## 2016-01-04 NOTE — Telephone Encounter (Signed)
New Message  Pt c/o of Chest Congestion??   1. Are you having Chest Congestion  right now? Yes  2. Are you experiencing any other symptoms (ex. SOB, nausea, vomiting, sweating)? He feels like it could be chest pain.. But it feels like fluid on his chest that he has to cough up.  3. How long have you been experiencing Chest Congestion ? 1 day  4. Is you Chest Congestion continuous or coming and going? Continuous

## 2016-01-04 NOTE — Telephone Encounter (Signed)
Spoke with pt, since yesterday he has noticed a congestion type issue in his chest. He is having to clear his throat a lot. He has no edema and has not weighed in about one week. He can feel the congestion when he lies flat but is not SOB. He does not have a productive cough. He has a follow up appt Monday. He will try claritin or mucinex OTC for symptoms. He will call prior to Monday if symptoms change or worsen

## 2016-01-08 ENCOUNTER — Ambulatory Visit: Payer: Self-pay | Admitting: Cardiology

## 2016-01-19 ENCOUNTER — Other Ambulatory Visit: Payer: Self-pay | Admitting: Cardiology

## 2016-02-14 ENCOUNTER — Telehealth: Payer: Self-pay | Admitting: Cardiology

## 2016-02-14 NOTE — Telephone Encounter (Signed)
Phone rings to VM. Left msg to call.

## 2016-02-14 NOTE — Telephone Encounter (Signed)
New Message  Pt c/o of symptoms of a "chest cold" everytime he lays down. Pt has appt 2/17- Please call back and discuss.

## 2016-02-16 ENCOUNTER — Encounter: Payer: Self-pay | Admitting: Cardiology

## 2016-02-16 ENCOUNTER — Other Ambulatory Visit: Payer: Self-pay | Admitting: Cardiology

## 2016-02-16 ENCOUNTER — Ambulatory Visit (INDEPENDENT_AMBULATORY_CARE_PROVIDER_SITE_OTHER): Payer: Self-pay | Admitting: Cardiology

## 2016-02-16 VITALS — BP 140/102 | HR 80 | Ht 78.0 in | Wt 299.2 lb

## 2016-02-16 DIAGNOSIS — I2510 Atherosclerotic heart disease of native coronary artery without angina pectoris: Secondary | ICD-10-CM

## 2016-02-16 MED ORDER — LISINOPRIL 20 MG PO TABS: 40.0000 mg | ORAL_TABLET | Freq: Every day | ORAL | Status: DC

## 2016-02-16 MED ORDER — AMLODIPINE BESYLATE 10 MG PO TABS: 10.0000 mg | ORAL_TABLET | Freq: Every day | ORAL | Status: DC

## 2016-02-16 MED ORDER — GLIPIZIDE 10 MG PO TABS: 10.0000 mg | ORAL_TABLET | Freq: Two times a day (BID) | ORAL | Status: DC

## 2016-02-16 NOTE — Patient Instructions (Signed)
Medication Instructions:   RESTART AMLODIPINE 10 MG ONCE DAILY  Labwork:  Your physician recommends that you HAVE LAB WORK TODAY  Follow-Up:  Your physician recommends that you schedule a follow-up appointment in: 3 MONTHS WITH APP

## 2016-02-16 NOTE — Progress Notes (Signed)
HPI The patient presents for follow up of CAD and cardiomyopathy.  His EF has been 15% in the past and then to 50% and most recently back at 40%.  Because he has no insurance he has had a hard time paying for meds.   He has been off of Norvasc.  He twice failed to spironolactone with itching eyes.  He had one episode since I last saw him of dyspnea while carrying two things.  Otherwise he has felt well.  The patient denies any new symptoms such as chest discomfort, neck or arm discomfort. There has been no new shortness of breath, PND or orthopnea. There have been no reported palpitations, presyncope or syncope.   Allergies  Allergen Reactions  . Spironolactone     Dry eyes    Current Outpatient Prescriptions  Medication Sig Dispense Refill  . aspirin EC 325 MG tablet Take 1 tablet (325 mg total) by mouth daily. 30 tablet 0  . carvedilol (COREG) 25 MG tablet Take 1 tablet (25 mg total) by mouth 2 (two) times daily. 180 tablet 3  . furosemide (LASIX) 20 MG tablet TAKE ONE TABLET BY MOUTH ONCE DAILY 30 tablet 3  . glipiZIDE (GLUCOTROL) 10 MG tablet Take 1 tablet (10 mg total) by mouth 2 (two) times daily before a meal. 180 tablet 3  . lisinopril (PRINIVIL,ZESTRIL) 20 MG tablet TAKE TWO TABLETS BY MOUTH ONCE DAILY 180 tablet 3  . nitroGLYCERIN (NITROSTAT) 0.4 MG SL tablet Place 1 tablet (0.4 mg total) under the tongue every 5 (five) minutes x 3 doses as needed for chest pain. 20 tablet 1   No current facility-administered medications for this visit.    Past Medical History  Diagnosis Date  . CAD (coronary artery disease)     NSTEMI 10/2011: LHC 11/04/11: pLAD 90%, mLAD 60-70%, small D2 sub totally occluded at ostium, small OM1 90% ostial, 90% mid, mOM2 30%, oPL 80%, RCA 30%, dPDA 80%, EF 20% with ant AK.  PCI:  Promus DES to pLAD.  . Ischemic cardiomyopathy     Echo 11/03/11: mod LVH, mild focal basal septal hypertrophy, EF 15%, grade 2 diast dysfxn, mild MR, mild to mod LAE, mild RVE, mild  to mod reduced RVSF.  EF 3/5 50% by echo  . DM2 (diabetes mellitus, type 2) (HCC)   . HTN (hypertension)   . Hyperlipidemia   . Chronic systolic heart failure (HCC)   . Obesity     Past Surgical History  Procedure Laterality Date  . None    . Left and right heart catheterization with coronary angiogram N/A 11/04/2011    Procedure: LEFT AND RIGHT HEART CATHETERIZATION WITH CORONARY ANGIOGRAM;  Surgeon: Rollene Rotunda, MD;  Location: [facility] CATH LAB;  Service: Cardiovascular;  Laterality: N/A;  . Percutaneous coronary stent intervention (pci-s)  11/04/2011    Procedure: PERCUTANEOUS CORONARY STENT INTERVENTION (PCI-S);  Surgeon: Tonny Bollman, MD;  Location: [facility] CATH LAB;  Service: Cardiovascular;;    ROS:  As stated in the HPI and negative for all other systems.  PHYSICAL EXAM BP 140/102 mmHg  Pulse 80  Ht  (1.981 m)  Wt 299 lb 3 oz (135.711 kg)  BMI 34.58 kg/m2 GENERAL:  Well appearing NECK:  No jugular venous distention, waveform within normal limits, carotid upstroke brisk and symmetric, no bruits, no thyromegaly LYMPHATICS:  No cervical, inguinal adenopathy LUNGS:  Clear to auscultation bilaterally BACK:  No CVA tenderness CHEST:  Unremarkable HEART:  PMI not displaced or sustained,S1 and  S2 within normal limits, positive S3, no S4, no clicks, no rubs, no murmurs ABD:  Flat, positive bowel sounds normal in frequency in pitch, no bruits, no rebound, no guarding, no midline pulsatile mass, no hepatomegaly, no splenomegaly EXT:  2 plus pulses throughout, mild leg edema, no cyanosis no clubbing  EKG:  Sinus rhythm, rate 81, axis within normal limits, intervals within normal limits, no acute ST-T wave changes.  02/16/2016  ASSESSMENT AND PLAN  CAD:  The patient has no new sypmtoms.  No further cardiovascular testing is indicated.  We will continue with aggressive risk reduction and meds as listed.  I will consider an ischemia work up in the future after his BP is improved.   However, I have a very low suspicion for ischemic heart disease.      CARDIOMYOPATHY:  He is euvolemic.  No change in diuretic is necessary.  He needs to avoid salt.   OBESITY:  I gave him a weight loss goal.  HTN:  This is being managed in the context of treating his CHF.   He needs to restart the Norvasc.    SNORING:  He almost definitely has sleep apnea but could not afford the mask or diagnosis.  He will concentrate on losing weight and exercising.  He has lost some weight.

## 2016-02-17 LAB — BASIC METABOLIC PANEL
BUN: 29 mg/dL — AB (ref 7–25)
CALCIUM: 9.1 mg/dL (ref 8.6–10.3)
CO2: 25 mmol/L (ref 20–31)
CREATININE: 1.53 mg/dL — AB (ref 0.60–1.35)
Chloride: 102 mmol/L (ref 98–110)
Glucose, Bld: 196 mg/dL — ABNORMAL HIGH (ref 65–99)
Potassium: 3.9 mmol/L (ref 3.5–5.3)
Sodium: 138 mmol/L (ref 135–146)

## 2016-02-19 NOTE — Telephone Encounter (Signed)
Patient saw MD 2/17 - encounter closed.

## 2016-02-21 ENCOUNTER — Other Ambulatory Visit: Payer: Self-pay | Admitting: *Deleted

## 2016-02-21 DIAGNOSIS — N289 Disorder of kidney and ureter, unspecified: Secondary | ICD-10-CM

## 2016-02-21 DIAGNOSIS — I10 Essential (primary) hypertension: Secondary | ICD-10-CM

## 2016-04-13 ENCOUNTER — Other Ambulatory Visit: Payer: Self-pay | Admitting: Cardiology

## 2016-04-15 NOTE — Telephone Encounter (Signed)
Rx(s) sent to pharmacy electronically.  

## 2016-05-24 ENCOUNTER — Ambulatory Visit: Payer: Self-pay | Admitting: Physician Assistant

## 2016-05-24 ENCOUNTER — Encounter: Payer: Self-pay | Admitting: *Deleted

## 2016-07-09 ENCOUNTER — Telehealth: Payer: Self-pay | Admitting: Cardiology

## 2016-07-09 NOTE — Telephone Encounter (Signed)
New Message  Pt call to speak with RN. Pt states RN would know his history and wants to speak with RN to see if he needs to make an appt or not. Please call back to advise

## 2016-07-09 NOTE — Telephone Encounter (Signed)
Returned call to patient. He stated he's been having occasional chest pressure. It occurs mainly when he's eating. He will take a few tums and then is goes away. It happens not quite once per week. It lasts about 10 minutes then goes away. He denies CP, SOB, palpitations during these episodes. He also said he experiences SOB at times when walking outside a longer distance since the weather has turned hot. He says he stays hydrated.  His other concern is regarding sexual activity.    Appt scheduled on 07/11/16 with FLEX for pt. Pt verbalized understanding to go to the emergency room or call 911 if he develops other symptoms, such as CP, SOB, nausea, vomiting, or sweating.

## 2016-07-10 ENCOUNTER — Encounter: Payer: Self-pay | Admitting: Physician Assistant

## 2016-07-11 ENCOUNTER — Encounter: Payer: Self-pay | Admitting: Physician Assistant

## 2016-07-11 ENCOUNTER — Ambulatory Visit (INDEPENDENT_AMBULATORY_CARE_PROVIDER_SITE_OTHER): Payer: Self-pay | Admitting: Physician Assistant

## 2016-07-11 ENCOUNTER — Encounter (INDEPENDENT_AMBULATORY_CARE_PROVIDER_SITE_OTHER): Payer: Self-pay

## 2016-07-11 VITALS — BP 142/92 | HR 74 | Ht 78.0 in | Wt 297.4 lb

## 2016-07-11 DIAGNOSIS — I10 Essential (primary) hypertension: Secondary | ICD-10-CM

## 2016-07-11 DIAGNOSIS — R0609 Other forms of dyspnea: Secondary | ICD-10-CM

## 2016-07-11 DIAGNOSIS — R1013 Epigastric pain: Secondary | ICD-10-CM

## 2016-07-11 DIAGNOSIS — I2510 Atherosclerotic heart disease of native coronary artery without angina pectoris: Secondary | ICD-10-CM

## 2016-07-11 DIAGNOSIS — Z789 Other specified health status: Secondary | ICD-10-CM

## 2016-07-11 DIAGNOSIS — N289 Disorder of kidney and ureter, unspecified: Secondary | ICD-10-CM

## 2016-07-11 DIAGNOSIS — I255 Ischemic cardiomyopathy: Secondary | ICD-10-CM

## 2016-07-11 DIAGNOSIS — K30 Functional dyspepsia: Secondary | ICD-10-CM

## 2016-07-11 LAB — BASIC METABOLIC PANEL
BUN: 22 mg/dL (ref 7–25)
CO2: 24 mmol/L (ref 20–31)
CREATININE: 1.38 mg/dL — AB (ref 0.60–1.35)
Calcium: 9.3 mg/dL (ref 8.6–10.3)
Chloride: 105 mmol/L (ref 98–110)
Glucose, Bld: 146 mg/dL — ABNORMAL HIGH (ref 65–99)
Potassium: 4.2 mmol/L (ref 3.5–5.3)
SODIUM: 139 mmol/L (ref 135–146)

## 2016-07-11 LAB — TESTOSTERONE: Testosterone: 210 ng/dL — ABNORMAL LOW (ref 250–827)

## 2016-07-11 MED ORDER — ISOSORBIDE MONONITRATE ER 30 MG PO TB24: 30.0000 mg | ORAL_TABLET | Freq: Every day | ORAL | Status: DC

## 2016-07-11 NOTE — Progress Notes (Signed)
Cardiology Office Note    Date:  07/11/2016   ID:  [name], DOB August 08, 1971, MRN [id number]  PCP:  No PCP Per Patient  Cardiologist:  Dr.Hochrein  Chief complaint dyspnea on exertion and indigestion  History of Present Illness:  [name] is a 45 y.o. male with history of CAD S/P NonSTEMI 10/2011 DES LAD and cardiomyopathy. EF has been 15% in the past and then 50% and most recently back to 40% 01/27/15 . Because he has no insurance he has had a hard time paying for medicines. He's been off Norvasc and twice filled spironolactone with itching eyes. He was last seen here in 01/2016 and doing fairly well. Aggressive risk reduction and meds was recommended. Ischemic workup in the future after blood pressure improves was recommended. He was euvolemic. Norvasc was restarted for hypertension. He almost definitely has sleep apnea but could not afford the mask or diagnosis. He was concentrating on weight loss.  Complains of dyspnea on exertion that occurs when it's hot out or carrying heavy things. Stops for 10 seconds and then can continue on.  Also complains of indigestion after eating that eases with 2 tums. Took prilosec for 1 week and Had no further indigestion but he stopped it because he worried it may interact with his other meds. Also had 3 episodes of inability to have an erection. Has no primary MD. blood pressure usually runs 140/90-95.    Past Medical History  Diagnosis Date  . CAD (coronary artery disease)     NSTEMI 10/2011: LHC 11/04/11: pLAD 90%, mLAD 60-70%, small D2 sub totally occluded at ostium, small OM1 90% ostial, 90% mid, mOM2 30%, oPL 80%, RCA 30%, dPDA 80%, EF 20% with ant AK.  PCI:  Promus DES to pLAD.  . Ischemic cardiomyopathy     Echo 11/03/11: mod LVH, mild focal basal septal hypertrophy, EF 15%, grade 2 diast dysfxn, mild MR, mild to mod LAE, mild RVE, mild to mod reduced RVSF.  EF 3/5 50% by echo  . DM2 (diabetes mellitus, type 2) (HCC)   . HTN  (hypertension)   . Hyperlipidemia   . Chronic systolic heart failure (HCC)   . Obesity     Past Surgical History  Procedure Laterality Date  . None    . Left and right heart catheterization with coronary angiogram N/A 11/04/2011    Procedure: LEFT AND RIGHT HEART CATHETERIZATION WITH CORONARY ANGIOGRAM;  Surgeon: Rollene Rotunda, MD;  Location: [facility] CATH LAB;  Service: Cardiovascular;  Laterality: N/A;  . Percutaneous coronary stent intervention (pci-s)  11/04/2011    Procedure: PERCUTANEOUS CORONARY STENT INTERVENTION (PCI-S);  Surgeon: Tonny Bollman, MD;  Location: [facility] CATH LAB;  Service: Cardiovascular;;    Current Medications: Outpatient Prescriptions Prior to Visit  Medication Sig Dispense Refill  . amLODipine (NORVASC) 10 MG tablet Take 1 tablet (10 mg total) by mouth daily. 90 tablet 3  . aspirin EC 325 MG tablet Take 1 tablet (325 mg total) by mouth daily. 30 tablet 0  . carvedilol (COREG) 25 MG tablet TAKE ONE TABLET BY MOUTH TWICE DAILY 180 tablet 3  . furosemide (LASIX) 20 MG tablet TAKE ONE TABLET BY MOUTH ONCE DAILY 30 tablet 3  . glipiZIDE (GLUCOTROL) 10 MG tablet Take 1 tablet (10 mg total) by mouth 2 (two) times daily before a meal. 180 tablet 3  . lisinopril (PRINIVIL,ZESTRIL) 20 MG tablet Take 2 tablets (40 mg total) by mouth daily. 180 tablet 3  . nitroGLYCERIN (NITROSTAT) 0.4 MG SL  tablet Place 1 tablet (0.4 mg total) under the tongue every 5 (five) minutes x 3 doses as needed for chest pain. 20 tablet 1   No facility-administered medications prior to visit.     Allergies:   Spironolactone   Social History   Social History  . Marital Status: Single    Spouse Name: N/A  . Number of Children: N/A  . Years of Education: N/A   Social History Main Topics  . Smoking status: Never Smoker   . Smokeless tobacco: None  . Alcohol Use: Yes     Comment: OCCASIONAL  . Drug Use: No  . Sexual Activity: Not Asked   Other Topics Concern  . None   Social History  Narrative     Family History:  The patient's family history includes Heart disease in his mother.   ROS:   Please see the history of present illness.    Review of Systems  Constitution: Negative.  HENT: Negative.   Cardiovascular: Positive for dyspnea on exertion.  Respiratory: Positive for shortness of breath.   Endocrine: Negative.   Hematologic/Lymphatic: Negative.   Musculoskeletal: Negative.   Gastrointestinal: Positive for heartburn.  Genitourinary: Negative.   Neurological: Negative.    All other systems reviewed and are negative.   PHYSICAL EXAM:   VS:  BP 142/92 mmHg  Pulse 74  Ht 6\' 6"  (1.981 m)  Wt 297 lb 6.4 oz (134.9 kg)  BMI 34.38 kg/m2   GEN: Obese, in no acute distress HEENT: normal Neck: no JVD, carotid bruits, or masses Cardiac: RRR; distant heart sounds,no murmurs, rubs, or gallops,no edema  Respiratory:  clear to auscultation bilaterally, normal work of breathing GI: soft, nontender, nondistended, + BS MS: no deformity or atrophy Skin: warm and dry, no rash Neuro:  Alert and Oriented x 3, Strength and sensation are intact Psych: euthymic mood, full affect  Wt Readings from Last 3 Encounters:  07/11/16 297 lb 6.4 oz (134.9 kg)  02/16/16 299 lb 3 oz (135.711 kg)  03/31/15 298 lb (135.172 kg)      Studies/Labs Reviewed:   EKG:  EKG is ordered today.  The ekg ordered today demonstrates Normal sinus rhythm with PVC, nonspecific ST-T wave changes, incomplete left bundle branch block which is new  Recent Labs: 02/16/2016: BUN 29*; Creat 1.53*; Potassium 3.9; Sodium 138   Lipid Panel    Component Value Date/Time   CHOL 187 06/14/2013 0826   TRIG 233.0* 06/14/2013 0826   HDL 38.00* 06/14/2013 0826   CHOLHDL 5 06/14/2013 0826   VLDL 46.6* 06/14/2013 0826   LDLCALC 96 12/02/2011 1016   LDLDIRECT 117.0 06/14/2013 0826    Additional studies/ records that were reviewed today include:  2-D echo 12/2015 Study Conclusions  - Left ventricle: Poor  acoiustic windows make evaluation difficult   Endocardium is difficult to see. LVEF appears down at   apprxoimately 35 to 40% with hypokinesis worse in the   inferior/inferoseptal wall Endocardium of anterior wall is   difficult to see. SInce report to 2013, LVEF is worse. The cavity   size was normal. Wall thickness was increased in a pattern of   mild LVH. Doppler parameters are consistent with abnormal left   ventricular relaxation (grade 1 diastolic dysfunction). - Mitral valve: There was mild regurgitation. - Left atrium: The atrium was moderately dilated.    ASSESSMENT:    1. Coronary artery disease involving native coronary artery of native heart without angina pectoris   2. Essential hypertension, benign  3. Ischemic cardiomyopathy   4. Health maintenance alteration   5. Dyspnea on exertion   6. Indigestion   7. Renal insufficiency      PLAN:  In order of problems listed above:  CAD status post remote MI and stenting in 2012. Patient complaining of dyspnea on exertion in the heat or when carrying something heavy. This certainly could be related to his ischemic cardiomyopathy and EF of 35-40% but could also be anginal equivalent. He has no insurance and ischemic workup has been deferred because of this in the past. Because he is self-pay and nuclear stress test would cost him $3700 out of pocket. He is unable to afford that at this time. We'll try Imdur were 30 mg once daily. His if he has any lung symptoms he is advised to go straight to the emergency room. He will follow-up with Dr.Hochrein in one month to discuss further workup and treatment  Essential hypertension elevated add Imdur, 2 g sodium diet  Ischemic cardiomyopathy EF 15% improved to 50% but most recently 35-40%. No evidence of heart failure and exam.  Health maintenance Will refer to: Primary care to follow closely and workup and potency  Dyspnea on exertion rule out anginal equivalent with nuclear stress  test but patient can't afford. Adding Imdur. Close follow-up.  Indigestion Prilosec over-the-counter helped him so he may take daily  Renal insufficiency patient's creatinine was up to 1.53 back in February and he never had it rechecked. We'll recheck today. May need to decrease lisinopril or Lasix.    Medication Adjustments/Labs and Tests Ordered: Current medicines are reviewed at length with the patient today.  Concerns regarding medicines are outlined above.  Medication changes, Labs and Tests ordered today are listed in the Patient Instructions below. Patient Instructions  Medication Instructions:  Your physician has recommended you make the following change in your medication:  1.  START the Imdur 30 mg taking 1 tablet daily  Labwork: TODAY:  BMET  Testing/Procedures: You have been referred to [facility]IMARY [facility]   Follow-Up: Your physician recommends that you schedule a follow-up appointment in: 1 MONTH WITH DR. HOCHREIN  Any Other Special Instructions Will Be Listed Below (If Applicable).     If you need a refill on your cardiac medications before your next appointment, please call your pharmacy.       Elson ClanSigned, Michele Lenze, PA-C  07/11/2016 10:40 AM    [facility] [address], [place], [place]  [address] Phone: [phone]) [phone]; Fax: [phone]) [phone]

## 2016-07-11 NOTE — Patient Instructions (Addendum)
Medication Instructions:  Your physician has recommended you make the following change in your medication:  1.  START the Imdur 30 mg taking 1 tablet daily  Labwork: TODAY:  BMET  Testing/Procedures: You have been referred to [facility]   Follow-Up: Your physician recommends that you schedule a follow-up appointment in: 1 MONTH WITH DR. HOCHREIN  Any Other Special Instructions Will Be Listed Below (If Applicable).     If you need a refill on your cardiac medications before your next appointment, please call your pharmacy.

## 2016-07-11 NOTE — Addendum Note (Signed)
Addended by: BOWDEN, ROBIN K on: 07/11/2016 10:55 AM   Modules accepted: Orders  

## 2016-07-11 NOTE — Addendum Note (Signed)
Addended by: Tonita PhoenixBOWDEN, ROBIN K on: 07/11/2016 10:55 AM   Modules accepted: Orders

## 2016-08-08 ENCOUNTER — Other Ambulatory Visit: Payer: Self-pay | Admitting: Cardiology

## 2016-08-09 ENCOUNTER — Other Ambulatory Visit: Payer: Self-pay | Admitting: *Deleted

## 2016-08-09 MED ORDER — FUROSEMIDE 20 MG PO TABS: 20.0000 mg | ORAL_TABLET | Freq: Every day | ORAL | 0 refills | Status: DC

## 2016-08-23 ENCOUNTER — Ambulatory Visit: Payer: Self-pay | Admitting: Cardiology

## 2016-08-27 ENCOUNTER — Emergency Department (HOSPITAL_COMMUNITY)
Admission: EM | Admit: 2016-08-27 | Discharge: 2016-08-28 | Disposition: A | Discharge status: Home / Self Care | Payer: Self-pay | Attending: Emergency Medicine | Admitting: Emergency Medicine

## 2016-08-27 ENCOUNTER — Emergency Department (HOSPITAL_COMMUNITY): Payer: Self-pay

## 2016-08-27 ENCOUNTER — Telehealth: Payer: Self-pay | Admitting: Cardiology

## 2016-08-27 ENCOUNTER — Encounter (HOSPITAL_COMMUNITY): Payer: Self-pay | Admitting: Emergency Medicine

## 2016-08-27 DIAGNOSIS — I509 Heart failure, unspecified: Secondary | ICD-10-CM

## 2016-08-27 DIAGNOSIS — I5023 Acute on chronic systolic (congestive) heart failure: Secondary | ICD-10-CM | POA: Insufficient documentation

## 2016-08-27 DIAGNOSIS — Z79899 Other long term (current) drug therapy: Secondary | ICD-10-CM | POA: Insufficient documentation

## 2016-08-27 DIAGNOSIS — Z7982 Long term (current) use of aspirin: Secondary | ICD-10-CM | POA: Insufficient documentation

## 2016-08-27 DIAGNOSIS — I2510 Atherosclerotic heart disease of native coronary artery without angina pectoris: Secondary | ICD-10-CM | POA: Insufficient documentation

## 2016-08-27 DIAGNOSIS — Z7984 Long term (current) use of oral hypoglycemic drugs: Secondary | ICD-10-CM | POA: Insufficient documentation

## 2016-08-27 DIAGNOSIS — I252 Old myocardial infarction: Secondary | ICD-10-CM | POA: Insufficient documentation

## 2016-08-27 DIAGNOSIS — E119 Type 2 diabetes mellitus without complications: Secondary | ICD-10-CM | POA: Insufficient documentation

## 2016-08-27 DIAGNOSIS — I110 Hypertensive heart disease with heart failure: Secondary | ICD-10-CM | POA: Insufficient documentation

## 2016-08-27 LAB — BASIC METABOLIC PANEL
ANION GAP: 7 (ref 5–15)
BUN: 24 mg/dL — ABNORMAL HIGH (ref 6–20)
CHLORIDE: 105 mmol/L (ref 101–111)
CO2: 24 mmol/L (ref 22–32)
Calcium: 9.1 mg/dL (ref 8.9–10.3)
Creatinine, Ser: 1.41 mg/dL — ABNORMAL HIGH (ref 0.61–1.24)
GFR calc non Af Amer: >60 mL/min (ref >60)
Glucose, Bld: 202 mg/dL — ABNORMAL HIGH (ref 65–99)
POTASSIUM: 4 mmol/L (ref 3.5–5.1)
Sodium: 136 mmol/L (ref 135–145)

## 2016-08-27 LAB — CBC
HEMATOCRIT: 39.6 % (ref 39.0–52.0)
HEMOGLOBIN: 12.8 g/dL — AB (ref 13.0–17.0)
MCH: 26.4 pg (ref 26.0–34.0)
MCHC: 32.3 g/dL (ref 30.0–36.0)
MCV: 81.6 fL (ref 78.0–100.0)
Platelets: 217 10*3/uL (ref 150–400)
RBC: 4.85 MIL/uL (ref 4.22–5.81)
RDW: 13.6 % (ref 11.5–15.5)
WBC: 10.1 10*3/uL (ref 4.0–10.5)

## 2016-08-27 LAB — I-STAT TROPONIN, ED: TROPONIN I, POC: 0.05 ng/mL (ref 0.00–0.08)

## 2016-08-27 MED ORDER — VALSARTAN 320 MG PO TABS: 320.0000 mg | ORAL_TABLET | Freq: Every day | ORAL | 1 refills | Status: DC

## 2016-08-27 NOTE — ED Triage Notes (Signed)
Pt arrives with weakness and shortness of breath, states he thinks he's "got fluid on his lungs." Clearing his breath frequently in triage. States spoke with Dr. Antoine PocheHochrein (PCP) instructed him to double lasix today, which he did

## 2016-08-27 NOTE — Telephone Encounter (Signed)
Please call pt,he said the medicine is too expensive.

## 2016-08-27 NOTE — Telephone Encounter (Signed)
New message      Pt c/o Shortness Of Breath: STAT if SOB developed within the last 24 hours or pt is noticeably SOB on the phone  1. Are you currently SOB (can you hear that pt is SOB on the phone)? No   2. How long have you been experiencing SOB? 4-5 days   3. Are you SOB when sitting or when up moving around? Moving around comes and goes   4. Are you currently experiencing any other symptoms ? 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  Patient verbalized Something does not feel right.

## 2016-08-27 NOTE — Telephone Encounter (Signed)
Called patient and gave advice on medication switch. He is aware to start this medication and discontinue lisinopril, follow up w Dr.Hochrein as scheduled, and obtain bmet in 2 weeks. Aware to call if new concerns or if SOB worse, if he needs refills, or if cough not resolving after 1-2 weeks.  Pt voiced thanks and understanding.

## 2016-08-27 NOTE — Telephone Encounter (Signed)
Cough issue probably related to lisinopril dose.  Would suggest that he switch to valsartan 320 mg once daily.  Repeat BMET in 2 weeks (after visit with Dr. Antoine PocheHochrein)

## 2016-08-27 NOTE — Telephone Encounter (Signed)
Spoke to patient. States sometimes he is working to breath out, not really short of breath but feels 'congestion-type sensation' in his chest. He does note this seems to be more present when lying down and that this has been attributed to fluid issues in the past. Has been recommended to take extra lasix in the past, though he notes no signs of leg swelling. I advised to go ahead and take extra 20mg  today and see if this improves the chest symptoms any.  He also notes cough at times, this is dry, ongoing for "a while", he doesn't think directly related to the other symptoms, seems to be more frequently occurring.  He does not have PCP, denies other URI symptoms. He is aware I will route for review on possible medication SE's.    Has made appt to see Dr. Antoine PocheHochrein in 1 week.  Informed pt I will call w/ advice and to keep this appt.

## 2016-08-28 LAB — I-STAT TROPONIN, ED: Troponin i, poc: 0.06 ng/mL (ref 0.00–0.08)

## 2016-08-28 MED ORDER — FUROSEMIDE 10 MG/ML IJ SOLN
40.0000 mg | Freq: Once | INTRAMUSCULAR | Status: AC
  Administered 2016-08-28: 40 mg via INTRAMUSCULAR
  Filled 2016-08-28: qty 4

## 2016-08-28 NOTE — Telephone Encounter (Signed)
Agree if cough has improved and he remains on lisinopril likely not related to the drug. Ok to continue lisinopril.

## 2016-08-28 NOTE — Discharge Instructions (Signed)
Continue to take Lasix as directed. Follow-up with Dr. Antoine PocheHochrein.

## 2016-08-28 NOTE — ED Notes (Signed)
Pt ambulated with pulse ox, steady gait noted. O2 dropped to 88-89%, after pt stopped for few seconds O2 immediately jumps back up to 95-100% RA. EDP aware

## 2016-08-28 NOTE — Telephone Encounter (Signed)
Spoke to patient. He notes $175 copay for his valsartan - cannot afford this and would like to remain on the lisinopril for now unless another alternative can be recommended.  Also notes he was seen in ER last night, felt his symptoms warranted this. Notes he was getting increasingly SOB when lying down. He presented approx 9pm and was discharged home around 2am, per his report. Pt notes he was given IV lasix, urinated "more last night than in the last 2 days combined". Was instructed by ER provider not to increase his current lasix regimen until Friday.  Pt notes that his cough/chest symptoms are improved. Informed him if he feels this is alleviated by current interventions, would make sense to continue lisinopril. Can discuss on Friday, but will likely leave this to be addressed in detail at appt w Dr. Antoine PocheHochrein on Sept 7th.  Pt amenable to this. He will call back Friday to update me.

## 2016-08-28 NOTE — ED Provider Notes (Signed)
[facility]-EMERGENCY DEPT Provider Note   CSN: [id number] Arrival date & time: 08/27/16  2122  By signing my name below, I, [name], attest that this documentation has been prepared under the direction and in the presence of [name], MD. Electronically Signed: Aggie MoatsJenny [name], ED Scribe. 08/28/16. 12:21 AM.  History   Chief Complaint Chief Complaint  Patient presents with  . Shortness of Breath    The history is provided by the patient. No language interpreter was used.   HPI Comments:  [name] is a 45 y.o. male who presents to the Emergency Department complaining of intermittent SOB and weakness, which started earlier today. Pt reports that he had a stent placed 5 years ago and was placed on Lasix. He ran out of prescriptions for 4 days, but recently got them filled by PCP. Associated symptoms include difficulty sleeping secondary to SOB and mild pitting edema. SOB exacerbated with lying flat. Pt doubled dose of Lasix today in conjunction with cardiologist. Denies chest pain.   Past Medical History:  Diagnosis Date  . CAD (coronary artery disease)    NSTEMI 10/2011: LHC 11/04/11: pLAD 90%, mLAD 60-70%, small D2 sub totally occluded at ostium, small OM1 90% ostial, 90% mid, mOM2 30%, oPL 80%, RCA 30%, dPDA 80%, EF 20% with ant AK.  PCI:  Promus DES to pLAD.  [name]. Chronic systolic heart failure (HCC)   . DM2 (diabetes mellitus, type 2) (HCC)   . HTN (hypertension)   . Hyperlipidemia   . Ischemic cardiomyopathy    Echo 11/03/11: mod LVH, mild focal basal septal hypertrophy, EF 15%, grade 2 diast dysfxn, mild MR, mild to mod LAE, mild RVE, mild to mod reduced RVSF.  EF 3/5 50% by echo  . Obesity     Patient Active Problem List   Diagnosis Date Noted  . Dyspnea on exertion 07/11/2016  . Indigestion 07/11/2016  . Renal insufficiency 07/11/2016  . Chronic systolic heart failure (HCC) 11/13/2011  . CAD 11/13/2011  . Ischemic cardiomyopathy 11/04/2011  . Acute systolic  congestive heart failure (HCC) 11/04/2011  . MI, acute, non ST segment elevation (HCC) 11/03/2011  . Diabetes mellitus type 2, uncontrolled (HCC) 12/14/2010  . HYPERLIPIDEMIA 12/14/2010  . Obesity 12/14/2010  . Essential hypertension, benign 12/14/2010  . ACUTE KIDNEY FAILURE UNSPECIFIED 12/14/2010    Past Surgical History:  Procedure Laterality Date  . LEFT AND RIGHT HEART CATHETERIZATION WITH CORONARY ANGIOGRAM N/A 11/04/2011   Procedure: LEFT AND RIGHT HEART CATHETERIZATION WITH CORONARY ANGIOGRAM;  Surgeon: Rollene RotundaJames Hochrein, MD;  [facility];  Service: Cardiovascular;  Laterality: N/A;  . NONE    . PERCUTANEOUS CORONARY STENT INTERVENTION (PCI-S)  11/04/2011   Procedure: PERCUTANEOUS CORONARY STENT INTERVENTION (PCI-S);  Surgeon: Tonny BollmanMichael Cooper, MD;  [facility];  Service: Cardiovascular;;    OB History    No data available       Home Medications    Prior to Admission medications   Medication Sig Start Date End Date Taking? Authorizing Provider  amLODipine (NORVASC) 10 MG tablet Take 1 tablet (10 mg total) by mouth daily. 02/16/16  Yes Rollene RotundaJames Hochrein, MD  aspirin EC 325 MG tablet Take 1 tablet (325 mg total) by mouth daily. 06/02/13  Yes Rollene RotundaJames Hochrein, MD  carvedilol (COREG) 25 MG tablet TAKE ONE TABLET BY MOUTH TWICE DAILY 04/15/16  Yes Rollene RotundaJames Hochrein, MD  furosemide (LASIX) 20 MG tablet Take 1 tablet (20 mg total) by mouth daily. KEEP OV. 08/09/16  Yes Rollene RotundaJames Hochrein,  MD  glipiZIDE (GLUCOTROL) 10 MG tablet Take 1 tablet (10 mg total) by mouth 2 (two) times daily before a meal. 02/16/16  Yes Rollene Rotunda, MD  isosorbide mononitrate (IMDUR) 30 MG 24 hr tablet Take 1 tablet (30 mg total) by mouth daily. 07/11/16  Yes Dyann Kief, PA-C  lisinopril (PRINIVIL,ZESTRIL) 20 MG tablet Take 20 mg by mouth 2 (two) times daily.   Yes Historical Provider, MD  nitroGLYCERIN (NITROSTAT) 0.4 MG SL tablet Place 1 tablet (0.4 mg total) under the tongue every 5 (five) minutes x 3  doses as needed for chest pain. 11/06/11 08/28/16 Yes Hollice Espy, MD  omeprazole (PRILOSEC) 20 MG capsule Take 20 mg by mouth daily.   Yes Historical Provider, MD  valsartan (DIOVAN) 320 MG tablet Take 1 tablet (320 mg total) by mouth daily. 08/27/16   Rollene Rotunda, MD    Family History Family History  Problem Relation Age of Onset  . Heart disease Mother     MOTHER HAD CABG    Social History Social History  Substance Use Topics  . Smoking status: Never Smoker  . Smokeless tobacco: Not on file  . Alcohol use Yes     Comment: OCCASIONAL     Allergies   Spironolactone   Review of Systems Review of Systems  Constitutional: Negative for fever.  Respiratory: Positive for shortness of breath.   Cardiovascular: Positive for leg swelling. Negative for chest pain.  All other systems reviewed and are negative.    Physical Exam Updated Vital Signs BP 111/77 (BP Location: Right Arm)   Pulse 85   Temp 97.8 F (36.6 C) (Oral)   Resp 11   SpO2 95%   Physical Exam  Constitutional: He is oriented to person, place, and time. He appears well-developed and well-nourished.  overweight  HENT:  Head: Normocephalic and atraumatic.  Cardiovascular: Normal rate, regular rhythm and normal heart sounds.   No murmur heard. Pulmonary/Chest: Effort normal and breath sounds normal. No respiratory distress. He has no wheezes.  Abdominal: Soft. Bowel sounds are normal. There is no tenderness. There is no rebound.  Musculoskeletal: He exhibits edema.  1+ pitting edema  Neurological: He is alert and oriented to person, place, and time.  Skin: Skin is warm and dry.  Psychiatric: He has a normal mood and affect.  Nursing note and vitals reviewed.    ED Treatments / Results  DIAGNOSTIC STUDIES:  Oxygen Saturation is 100% on room air, normal by my interpretation.    COORDINATION OF CARE:  12:18 AM Discussed treatment plan with pt at bedside, which includes IV lasix, and pt agreed  to plan.  Labs (all labs ordered are listed, but only abnormal results are displayed) Labs Reviewed  BASIC METABOLIC PANEL - Abnormal; Notable for the following:       Result Value   Glucose, Bld 202 (*)    BUN 24 (*)    Creatinine, Ser 1.41 (*)    All other components within normal limits  CBC - Abnormal; Notable for the following:    Hemoglobin 12.8 (*)    All other components within normal limits  I-STAT TROPOININ, ED  I-STAT TROPOININ, ED    EKG  EKG Interpretation  Date/Time:  Tuesday August 27 2016 21:34:51 EDT Ventricular Rate:  97 PR Interval:  150 QRS Duration: 112 QT Interval:  378 QTC Calculation: 480 R Axis:   80 Text Interpretation:  Normal sinus rhythm Possible Left atrial enlargement Cannot rule out Inferior infarct ,  age undetermined Abnormal ECG No significant change since last tracing 7/13 Confirmed by Wilkie Aye  MD, Toni Amend ([id number]) on 08/28/2016 1:10:57 AM Also confirmed by Wilkie Aye  MD, COURTNEY ([id number]), editor [place] [address], Jola Babinski [phone])  on 08/28/2016 8:05:01 AM       Radiology No results found.  Procedures Procedures (including critical care time)  Medications Ordered in ED Medications  furosemide (LASIX) injection 40 mg (40 mg Intramuscular Given 08/28/16 0042)     Initial Impression / Assessment and Plan / ED Course  I have reviewed the triage vital signs and the nursing notes.  Pertinent labs & imaging results that were available during my care of the patient were reviewed by me and considered in my medical decision making (see chart for details).  Clinical Course    Patient presents with SOB.  Feels he is volume overloaded.  Recently ran out of meds but has doubled-up on Lasix x 1 day.  Mildly volume overload on exam.  Not hypoxic.  No CP.  EKG reassuring.  Mild vascular congestion on CXR.  Patient given 40 mg IM lasix with good UOP.  Encouraged patient to follow-up closely with cardiology regarding lasix adjustment.  After history, exam, and  medical workup I feel the patient has been appropriately medically screened and is safe for discharge home. Pertinent diagnoses were discussed with the patient. Patient was given return precautions.   Final Clinical Impressions(s) / ED Diagnoses   Final diagnoses:  Acute on chronic congestive heart failure, unspecified congestive heart failure type [facility])    New Prescriptions Discharge Medication List as of 08/28/2016  1:58 AM     I personally performed the services described in this documentation, which was scribed in my presence. The recorded information has been reviewed and is accurate.     Shon Baton, MD 08/31/16 [phone]

## 2016-09-02 NOTE — Progress Notes (Signed)
HPI The patient presents for follow up of CAD and cardiomyopathy.  His EF has been 15% in the past and then to 50% and most recently back at 40%.  Because he has no insurance he has had a hard time paying for meds.  He was having some dyspnea when he recently saw Herma Carson in the office.  He was started on Imdur.  He had cough and congestion which I though could be related to ACE inhibitor.  However, he could not afford an ARB.  He returns for follow up.  He reports that he had run out of his diuretic for about three days.  He went to the ED and I reviewed these records.  He had volume overload and was treated with IV diuresis.  He returns for follow up.  The patient denies any new symptoms such as chest discomfort, neck or arm discomfort. There has been no new shortness of breath, PND or orthopnea. There have been no reported palpitations, presyncope or syncope.      Allergies  Allergen Reactions  . Spironolactone     Dry eyes    Current Outpatient Prescriptions  Medication Sig Dispense Refill  . amLODipine (NORVASC) 10 MG tablet Take 1 tablet (10 mg total) by mouth daily. 90 tablet 3  . aspirin EC 325 MG tablet Take 1 tablet (325 mg total) by mouth daily. 30 tablet 0  . carvedilol (COREG) 25 MG tablet TAKE ONE TABLET BY MOUTH TWICE DAILY 180 tablet 3  . furosemide (LASIX) 20 MG tablet Take 1 tablet (20 mg total) by mouth daily. KEEP OV. 30 tablet 0  . glipiZIDE (GLUCOTROL) 10 MG tablet Take 1 tablet (10 mg total) by mouth 2 (two) times daily before a meal. 180 tablet 3  . isosorbide mononitrate (IMDUR) 30 MG 24 hr tablet Take 1 tablet (30 mg total) by mouth daily. 90 tablet 3  . lisinopril (PRINIVIL,ZESTRIL) 20 MG tablet Take 20 mg by mouth 2 (two) times daily.    [name] omeprazole (PRILOSEC) 20 MG capsule Take 20 mg by mouth daily.    . nitroGLYCERIN (NITROSTAT) 0.4 MG SL tablet Place 1 tablet (0.4 mg total) under the tongue every 5 (five) minutes x 3 doses as needed for chest pain. 20  tablet 1   No current facility-administered medications for this visit.     Past Medical History:  Diagnosis Date  . CAD (coronary artery disease)    NSTEMI 10/2011: LHC 11/04/11: pLAD 90%, mLAD 60-70%, small D2 sub totally occluded at ostium, small OM1 90% ostial, 90% mid, mOM2 30%, oPL 80%, RCA 30%, dPDA 80%, EF 20% with ant AK.  PCI:  Promus DES to pLAD.  [name] Chronic systolic heart failure (HCC)   . DM2 (diabetes mellitus, type 2) (HCC)   . HTN (hypertension)   . Hyperlipidemia   . Ischemic cardiomyopathy    Echo 11/03/11: mod LVH, mild focal basal septal hypertrophy, EF 15%, grade 2 diast dysfxn, mild MR, mild to mod LAE, mild RVE, mild to mod reduced RVSF.  EF 3/5 50% by echo  . Obesity     Past Surgical History:  Procedure Laterality Date  . LEFT AND RIGHT HEART CATHETERIZATION WITH CORONARY ANGIOGRAM N/A 11/04/2011   Procedure: LEFT AND RIGHT HEART CATHETERIZATION WITH CORONARY ANGIOGRAM;  Surgeon: Rollene Rotunda, MD;  Location: [facility] CATH LAB;  Service: Cardiovascular;  Laterality: N/A;  . NONE    . PERCUTANEOUS CORONARY STENT INTERVENTION (PCI-S)  11/04/2011   Procedure: PERCUTANEOUS CORONARY  STENT INTERVENTION (PCI-S);  Surgeon: Tonny BollmanMichael Cooper, MD;  [facility];  Service: Cardiovascular;;    ROS:  As stated in the HPI and negative for all other systems.  PHYSICAL EXAM BP (!) 158/76   Pulse 98   Ht 6' 7.2" (2.012 m)   Wt 296 lb 12.8 oz (134.6 kg)   SpO2 99%   BMI 33.27 kg/m  GENERAL:  Well appearing NECK:  No jugular venous distention, waveform within normal limits, carotid upstroke brisk and symmetric, no bruits, no thyromegaly LYMPHATICS:  No cervical, inguinal adenopathy LUNGS:  Clear to auscultation bilaterally BACK:  No CVA tenderness CHEST:  Unremarkable HEART:  PMI not displaced or sustained,S1 and S2 within normal limits, positive S3, no S4, no clicks, no rubs, no murmurs ABD:  Flat, positive bowel sounds normal in frequency in pitch, no bruits, no  rebound, no guarding, no midline pulsatile mass, no hepatomegaly, no splenomegaly EXT:  2 plus pulses throughout, mild leg edema, no cyanosis no clubbing   ASSESSMENT AND PLAN  CAD:  The patient has no new sypmtoms.   He reports that he does not want further testing because of cost.   I will need to do an ischemia work up in the future when his circumstances allow.        CARDIOMYOPATHY:  He is euvolemic.  Med changes as aboe.   OBESITY:  I gave him a weight loss goal.  HTN:  I am going to add spironolactone.  I will follow guidelines for spironolactone follow up in heart failure.  (Check potassium levels and  renal function 3--4 days and 1 week, then at least monthly for first 3 months and every 3 months thereafter after initiation of spironolactone.)  SNORING:  He almost definitely has sleep apnea but could not afford the mask or diagnosis.  He will concentrate on losing weight and exercising.  He has lost some weight.

## 2016-09-05 ENCOUNTER — Encounter: Payer: Self-pay | Admitting: Cardiology

## 2016-09-05 ENCOUNTER — Ambulatory Visit (INDEPENDENT_AMBULATORY_CARE_PROVIDER_SITE_OTHER): Payer: Self-pay | Admitting: Cardiology

## 2016-09-05 VITALS — BP 158/76 | HR 98 | Ht 79.2 in | Wt 296.8 lb

## 2016-09-05 DIAGNOSIS — I10 Essential (primary) hypertension: Secondary | ICD-10-CM

## 2016-09-05 DIAGNOSIS — Z79899 Other long term (current) drug therapy: Secondary | ICD-10-CM

## 2016-09-05 DIAGNOSIS — I420 Dilated cardiomyopathy: Secondary | ICD-10-CM

## 2016-09-05 MED ORDER — SPIRONOLACTONE 25 MG PO TABS: 25.0000 mg | ORAL_TABLET | Freq: Every day | ORAL | 11 refills | Status: DC

## 2016-09-05 NOTE — Patient Instructions (Signed)
Medication Instructions:  START Spironolactone 25 mg daily  Labwork: BMP in 10 days and BMP in 1 Month  Testing/Procedures: None Ordered  Follow-Up: Your physician recommends that you schedule a follow-up appointment in: 1 Month with APP   Any Other Special Instructions Will Be Listed Below (If Applicable).   If you need a refill on your cardiac medications before your next appointment, please call your pharmacy.

## 2016-10-04 ENCOUNTER — Ambulatory Visit: Payer: Self-pay | Admitting: Cardiology

## 2016-10-04 ENCOUNTER — Telehealth: Payer: Self-pay | Admitting: Cardiology

## 2016-10-04 NOTE — Telephone Encounter (Signed)
Left message to call back  

## 2016-10-04 NOTE — Telephone Encounter (Signed)
New message      Pt was scheduled to have a return appt today but he has a flat tire.  He will resc the appt but was wondering if he was supposed to have had labs drawn prior to this appt.  Please call

## 2016-10-13 ENCOUNTER — Other Ambulatory Visit: Payer: Self-pay | Admitting: Cardiology

## 2016-10-14 NOTE — Telephone Encounter (Signed)
Rx has been sent to the pharmacy electronically. ° °

## 2016-10-22 ENCOUNTER — Emergency Department (HOSPITAL_COMMUNITY): Payer: Self-pay

## 2016-10-22 ENCOUNTER — Inpatient Hospital Stay (HOSPITAL_COMMUNITY)
Admission: EM | Admit: 2016-10-22 | Discharge: 2016-10-30 | DRG: 246 | Disposition: A | Discharge status: Home / Self Care | Payer: Self-pay | Attending: Internal Medicine | Admitting: Internal Medicine

## 2016-10-22 DIAGNOSIS — E785 Hyperlipidemia, unspecified: Secondary | ICD-10-CM | POA: Diagnosis present

## 2016-10-22 DIAGNOSIS — Z7982 Long term (current) use of aspirin: Secondary | ICD-10-CM

## 2016-10-22 DIAGNOSIS — E1165 Type 2 diabetes mellitus with hyperglycemia: Secondary | ICD-10-CM | POA: Diagnosis present

## 2016-10-22 DIAGNOSIS — R0609 Other forms of dyspnea: Secondary | ICD-10-CM | POA: Diagnosis present

## 2016-10-22 DIAGNOSIS — E119 Type 2 diabetes mellitus without complications: Secondary | ICD-10-CM | POA: Diagnosis present

## 2016-10-22 DIAGNOSIS — R0600 Dyspnea, unspecified: Secondary | ICD-10-CM

## 2016-10-22 DIAGNOSIS — I10 Essential (primary) hypertension: Secondary | ICD-10-CM | POA: Diagnosis present

## 2016-10-22 DIAGNOSIS — Z6834 Body mass index (BMI) 34.0-34.9, adult: Secondary | ICD-10-CM

## 2016-10-22 DIAGNOSIS — R791 Abnormal coagulation profile: Secondary | ICD-10-CM | POA: Diagnosis present

## 2016-10-22 DIAGNOSIS — Z7984 Long term (current) use of oral hypoglycemic drugs: Secondary | ICD-10-CM

## 2016-10-22 DIAGNOSIS — I2721 Secondary pulmonary arterial hypertension: Secondary | ICD-10-CM | POA: Diagnosis present

## 2016-10-22 DIAGNOSIS — Z8249 Family history of ischemic heart disease and other diseases of the circulatory system: Secondary | ICD-10-CM

## 2016-10-22 DIAGNOSIS — N179 Acute kidney failure, unspecified: Secondary | ICD-10-CM | POA: Diagnosis present

## 2016-10-22 DIAGNOSIS — I255 Ischemic cardiomyopathy: Secondary | ICD-10-CM | POA: Diagnosis present

## 2016-10-22 DIAGNOSIS — I5023 Acute on chronic systolic (congestive) heart failure: Secondary | ICD-10-CM | POA: Diagnosis present

## 2016-10-22 DIAGNOSIS — R0902 Hypoxemia: Secondary | ICD-10-CM | POA: Diagnosis present

## 2016-10-22 DIAGNOSIS — E669 Obesity, unspecified: Secondary | ICD-10-CM

## 2016-10-22 DIAGNOSIS — Z79899 Other long term (current) drug therapy: Secondary | ICD-10-CM

## 2016-10-22 DIAGNOSIS — Z955 Presence of coronary angioplasty implant and graft: Secondary | ICD-10-CM

## 2016-10-22 DIAGNOSIS — R778 Other specified abnormalities of plasma proteins: Secondary | ICD-10-CM | POA: Diagnosis present

## 2016-10-22 DIAGNOSIS — I2510 Atherosclerotic heart disease of native coronary artery without angina pectoris: Secondary | ICD-10-CM | POA: Diagnosis present

## 2016-10-22 DIAGNOSIS — I509 Heart failure, unspecified: Secondary | ICD-10-CM

## 2016-10-22 DIAGNOSIS — R7989 Other specified abnormal findings of blood chemistry: Secondary | ICD-10-CM | POA: Diagnosis present

## 2016-10-22 DIAGNOSIS — G4733 Obstructive sleep apnea (adult) (pediatric): Secondary | ICD-10-CM | POA: Diagnosis present

## 2016-10-22 DIAGNOSIS — Z888 Allergy status to other drugs, medicaments and biological substances status: Secondary | ICD-10-CM

## 2016-10-22 DIAGNOSIS — I4581 Long QT syndrome: Secondary | ICD-10-CM | POA: Diagnosis present

## 2016-10-22 DIAGNOSIS — I252 Old myocardial infarction: Secondary | ICD-10-CM

## 2016-10-22 DIAGNOSIS — I5021 Acute systolic (congestive) heart failure: Secondary | ICD-10-CM | POA: Diagnosis present

## 2016-10-22 DIAGNOSIS — N183 Chronic kidney disease, stage 3 (moderate): Secondary | ICD-10-CM | POA: Diagnosis present

## 2016-10-22 DIAGNOSIS — J069 Acute upper respiratory infection, unspecified: Secondary | ICD-10-CM | POA: Diagnosis present

## 2016-10-22 DIAGNOSIS — K219 Gastro-esophageal reflux disease without esophagitis: Secondary | ICD-10-CM | POA: Diagnosis present

## 2016-10-22 DIAGNOSIS — E1122 Type 2 diabetes mellitus with diabetic chronic kidney disease: Secondary | ICD-10-CM | POA: Diagnosis present

## 2016-10-22 DIAGNOSIS — E1169 Type 2 diabetes mellitus with other specified complication: Secondary | ICD-10-CM | POA: Diagnosis present

## 2016-10-22 DIAGNOSIS — I130 Hypertensive heart and chronic kidney disease with heart failure and stage 1 through stage 4 chronic kidney disease, or unspecified chronic kidney disease: Principal | ICD-10-CM | POA: Diagnosis present

## 2016-10-22 LAB — COMPREHENSIVE METABOLIC PANEL
ALBUMIN: 3.9 g/dL (ref 3.5–5.0)
ALT: 35 U/L (ref 17–63)
AST: 30 U/L (ref 15–41)
Alkaline Phosphatase: 38 U/L (ref 38–126)
Anion gap: 10 (ref 5–15)
BUN: 35 mg/dL — AB (ref 6–20)
CHLORIDE: 107 mmol/L (ref 101–111)
CO2: 21 mmol/L — AB (ref 22–32)
Calcium: 9 mg/dL (ref 8.9–10.3)
Creatinine, Ser: 1.7 mg/dL — ABNORMAL HIGH (ref 0.61–1.24)
GFR calc Af Amer: 56 mL/min — ABNORMAL LOW (ref >60)
GFR, EST NON AFRICAN AMERICAN: 48 mL/min — AB (ref >60)
GLUCOSE: 135 mg/dL — AB (ref 65–99)
POTASSIUM: 4.4 mmol/L (ref 3.5–5.1)
SODIUM: 138 mmol/L (ref 135–145)
Total Bilirubin: 1.1 mg/dL (ref 0.3–1.2)
Total Protein: 6.9 g/dL (ref 6.5–8.1)

## 2016-10-22 LAB — CBC
HEMATOCRIT: 41.2 % (ref 39.0–52.0)
Hemoglobin: 14.1 g/dL (ref 13.0–17.0)
MCH: 26.6 pg (ref 26.0–34.0)
MCHC: 34.2 g/dL (ref 30.0–36.0)
MCV: 77.7 fL — AB (ref 78.0–100.0)
Platelets: 207 10*3/uL (ref 150–400)
RBC: 5.3 MIL/uL (ref 4.22–5.81)
RDW: 13.9 % (ref 11.5–15.5)
WBC: 10.7 10*3/uL — AB (ref 4.0–10.5)

## 2016-10-22 LAB — TROPONIN I: Troponin I: 0.06 ng/mL (ref <0.03)

## 2016-10-22 LAB — LIPASE, BLOOD: LIPASE: 34 U/L (ref 11–51)

## 2016-10-22 LAB — BRAIN NATRIURETIC PEPTIDE: B Natriuretic Peptide: 755.4 pg/mL — ABNORMAL HIGH (ref 0.0–100.0)

## 2016-10-22 NOTE — ED Triage Notes (Signed)
Pt has a hx of CHF and CAD and tonight started having epigastric pain and SOB. States it feels like when he had fluid retention. Alert and oriented.

## 2016-10-22 NOTE — ED Provider Notes (Signed)
[facility]-EMERGENCY DEPT Provider Note   CSN: [id number] Arrival date & time: 10/22/16  2230  By signing my name below, I, Emmanuella Mensah, attest that this documentation has been prepared under the direction and in the presence of Devoria Albe, MD. Electronically Signed: Angelene Giovanni, ED Scribe. 10/22/16. 3:35 AM.   Time seen 05:12 AM  History   Chief Complaint Chief Complaint  Patient presents with  . Abdominal Pain  . Shortness of Breath    HPI Comments: [name] is a 45 y.o. male with a hx of CAD, chronic systolic heart failure, DM II, HTN, and MI (2012) who presents to the Emergency Department complaining of sudden onset, intermittent episodes of SOB when he lays flat onset one week ago. He states that he is not currently experiencing his symptoms. He reports associated upper abdominal discomfort he describes as tightness and fullness during his episodes of SOB onset 4-5 days ago. He notes that his SOB is also worse upon exertion. He states that he has been having persistent moderate productive cough with green sputum and a sore throat. No alleviating factors noted. Pt states that he has tried OTC Robitussin and Mucinex with no relief. He reports that he was seen in the ED one month ago with these symptoms (except he was not having the productive cough then) and when he followed up with his Cardiologist, he had his medications changed. He denies that he is current smoker or any ETOH use. 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 He denies any fever, leg swelling, wheezing, nausea, vomiting, nasal congestion, or any other symptoms. He describes 2 episodes of diaphoresis when he was having the PND that result when he sat up.  Cardiologist: Dr. Antoine Poche   The history is provided by the patient. No language interpreter was used.    Past Medical History:  Diagnosis Date  . CAD (coronary artery disease)    NSTEMI 10/2011: LHC  11/04/11: pLAD 90%, mLAD 60-70%, small D2 sub totally occluded at ostium, small OM1 90% ostial, 90% mid, mOM2 30%, oPL 80%, RCA 30%, dPDA 80%, EF 20% with ant AK.  PCI:  Promus DES to pLAD.  [name] Chronic systolic heart failure (HCC)   . DM2 (diabetes mellitus, type 2) (HCC)   . HTN (hypertension)   . Hyperlipidemia   . Ischemic cardiomyopathy    Echo 11/03/11: mod LVH, mild focal basal septal hypertrophy, EF 15%, grade 2 diast dysfxn, mild MR, mild to mod LAE, mild RVE, mild to mod reduced RVSF.  EF 3/5 50% by echo  . Obesity     Patient Active Problem List   Diagnosis Date Noted  . Hypoxia 10/23/2016  . Dyspnea on exertion 07/11/2016  . Indigestion 07/11/2016  . Renal insufficiency 07/11/2016  . Chronic systolic heart failure (HCC) 11/13/2011  . CAD 11/13/2011  . Ischemic cardiomyopathy 11/04/2011  . Acute systolic congestive heart failure (HCC) 11/04/2011  . MI, acute, non ST segment elevation (HCC) 11/03/2011  . Diabetes mellitus type 2, uncontrolled (HCC) 12/14/2010  . HYPERLIPIDEMIA 12/14/2010  . Obesity 12/14/2010  . Essential hypertension, benign 12/14/2010  . ACUTE KIDNEY FAILURE UNSPECIFIED 12/14/2010    Past Surgical History:  Procedure Laterality Date  . LEFT AND RIGHT HEART CATHETERIZATION WITH CORONARY ANGIOGRAM N/A 11/04/2011   Procedure: LEFT AND RIGHT HEART CATHETERIZATION WITH CORONARY ANGIOGRAM;  Surgeon: Rollene Rotunda, MD;  Location: [facility] CATH LAB;  Service: Cardiovascular;  Laterality: N/A;  .  NONE    . PERCUTANEOUS CORONARY STENT INTERVENTION (PCI-S)  11/04/2011   Procedure: PERCUTANEOUS CORONARY STENT INTERVENTION (PCI-S);  Surgeon: Tonny Bollman, MD;  Location: [facility] CATH LAB;  Service: Cardiovascular;;    OB History    No data available       Home Medications    Prior to Admission medications   Medication Sig Start Date End Date Taking? Authorizing Provider  aspirin EC 325 MG tablet Take 1 tablet (325 mg total) by mouth daily. 06/02/13  Yes Rollene Rotunda,  MD  carvedilol (COREG) 25 MG tablet TAKE ONE TABLET BY MOUTH TWICE DAILY 04/15/16  Yes Rollene Rotunda, MD  furosemide (LASIX) 20 MG tablet Take 1 tablet (20 mg total) by mouth daily. KEEP OV. 08/09/16  Yes Rollene Rotunda, MD  glipiZIDE (GLUCOTROL) 10 MG tablet Take 1 tablet (10 mg total) by mouth 2 (two) times daily before a meal. 02/16/16  Yes Rollene Rotunda, MD  isosorbide mononitrate (IMDUR) 30 MG 24 hr tablet Take 1 tablet (30 mg total) by mouth daily. 07/11/16  Yes Dyann Kief, PA-C  lisinopril (PRINIVIL,ZESTRIL) 20 MG tablet Take 20 mg by mouth 2 (two) times daily.   Yes Historical Provider, MD  nitroGLYCERIN (NITROSTAT) 0.4 MG SL tablet Place 1 tablet (0.4 mg total) under the tongue every 5 (five) minutes x 3 doses as needed for chest pain. 11/06/11 10/23/16 Yes Hollice Espy, MD  omeprazole (PRILOSEC) 20 MG capsule Take 20 mg by mouth daily.   Yes Historical Provider, MD  spironolactone (ALDACTONE) 50 MG tablet TAKE ONE TABLET BY MOUTH ONCE DAILY 10/14/16  Yes Rollene Rotunda, MD  amLODipine (NORVASC) 10 MG tablet Take 1 tablet (10 mg total) by mouth daily. Patient not taking: Reported on 10/23/2016 02/16/16   Rollene Rotunda, MD  spironolactone (ALDACTONE) 25 MG tablet Take 1 tablet (25 mg total) by mouth daily. Patient not taking: Reported on 10/23/2016 09/05/16 12/04/16  Rollene Rotunda, MD    Family History Family History  Problem Relation Age of Onset  . Heart disease Mother     MOTHER HAD CABG    Social History Social History  Substance Use Topics  . Smoking status: Never Smoker  . Smokeless tobacco: Not on file  . Alcohol use Yes     Comment: OCCASIONAL  Employed   Allergies   Spironolactone   Review of Systems Review of Systems  Constitutional: Negative for chills and fever.  HENT: Positive for sore throat. Negative for congestion.   Respiratory: Positive for cough and shortness of breath. Negative for wheezing.   Cardiovascular: Negative for leg swelling.    Gastrointestinal: Positive for abdominal pain. Negative for nausea and vomiting.  All other systems reviewed and are negative.    Physical Exam Updated Vital Signs BP 118/83 (BP Location: Right Arm)   Pulse 81   Temp 97.6 F (36.4 C) (Oral)   Resp 20   Ht 6\' 5"  (1.956 m)   Wt 293 lb (132.9 kg)   SpO2 100%   BMI 34.74 kg/m   Vital signs normal    Physical Exam  Constitutional: He is oriented to person, place, and time. He appears well-developed and well-nourished.  Non-toxic appearance. He does not appear ill. No distress.  HENT:  Head: Normocephalic and atraumatic.  Right Ear: External ear normal.  Left Ear: External ear normal.  Nose: Nose normal. No mucosal edema or rhinorrhea.  Mouth/Throat: Oropharynx is clear and moist and mucous membranes are normal. No dental abscesses or uvula swelling.  Eyes: Conjunctivae and EOM are normal. Pupils are equal, round, and reactive to light.  Neck: Normal range of motion and full passive range of motion without pain. Neck supple.  Cardiovascular: Normal rate, regular rhythm and normal heart sounds.  Exam reveals no gallop and no friction rub.   No murmur heard. Pulmonary/Chest: Effort normal. No respiratory distress. He has decreased breath sounds. He has no wheezes. He has no rhonchi. He has no rales. He exhibits no tenderness and no crepitus.  Breath sounds mildly diminished   Abdominal: Soft. Normal appearance and bowel sounds are normal. He exhibits no distension. There is no tenderness. There is no rebound and no guarding.  Musculoskeletal: Normal range of motion. He exhibits no edema or tenderness.  Moves all extremities well.   Neurological: He is alert and oriented to person, place, and time. He has normal strength. No cranial nerve deficit.  Skin: Skin is warm, dry and intact. No rash noted. No erythema. No pallor.  Psychiatric: He has a normal mood and affect. His speech is normal and behavior is normal. His mood appears not  anxious.  Nursing note and vitals reviewed.    ED Treatments / Results  DIAGNOSTIC STUDIES: Oxygen Saturation is 100% on RA, normal by my interpretation.     Labs (all labs ordered are listed, but only abnormal results are displayed) Results for orders placed or performed during the hospital encounter of 10/22/16  Lipase, blood  Result Value Ref Range   Lipase 34 11 - 51 U/L  Comprehensive metabolic panel  Result Value Ref Range   Sodium 138 135 - 145 mmol/L   Potassium 4.4 3.5 - 5.1 mmol/L   Chloride 107 101 - 111 mmol/L   CO2 21 (L) 22 - 32 mmol/L   Glucose, Bld 135 (H) 65 - 99 mg/dL   BUN 35 (H) 6 - 20 mg/dL   Creatinine, Ser 1.61 (H) 0.61 - 1.24 mg/dL   Calcium 9.0 8.9 - 09.6 mg/dL   Total Protein 6.9 6.5 - 8.1 g/dL   Albumin 3.9 3.5 - 5.0 g/dL   AST 30 15 - 41 U/L   ALT 35 17 - 63 U/L   Alkaline Phosphatase 38 38 - 126 U/L   Total Bilirubin 1.1 0.3 - 1.2 mg/dL   GFR calc non Af Amer 48 (L) >60 mL/min   GFR calc Af Amer 56 (L) >60 mL/min   Anion gap 10 5 - 15  CBC  Result Value Ref Range   WBC 10.7 (H) 4.0 - 10.5 K/uL   RBC 5.30 4.22 - 5.81 MIL/uL   Hemoglobin 14.1 13.0 - 17.0 g/dL   HCT 04.5 40.9 - 81.1 %   MCV 77.7 (L) 78.0 - 100.0 fL   MCH 26.6 26.0 - 34.0 pg   MCHC 34.2 30.0 - 36.0 g/dL   RDW 91.4 78.2 - 95.6 %   Platelets 207 150 - 400 K/uL  Urinalysis, Routine w reflex microscopic  Result Value Ref Range   Color, Urine AMBER (A) YELLOW   APPearance CLEAR CLEAR   Specific Gravity, Urine 1.037 (H) 1.005 - 1.030   pH 6.0 5.0 - 8.0   Glucose, UA NEGATIVE NEGATIVE mg/dL   Hgb urine dipstick NEGATIVE NEGATIVE   Bilirubin Urine SMALL (A) NEGATIVE   Ketones, ur NEGATIVE NEGATIVE mg/dL   Protein, ur >213 (A) NEGATIVE mg/dL   Nitrite NEGATIVE NEGATIVE   Leukocytes, UA NEGATIVE NEGATIVE  Brain natriuretic peptide  Result Value Ref Range   B  Natriuretic Peptide 755.4 (H) 0.0 - 100.0 pg/mL  Troponin I  Result Value Ref Range   Troponin I 0.06 (HH) <0.03  ng/mL  Urine microscopic-add on  Result Value Ref Range   Squamous Epithelial / LPF 0-5 (A) NONE SEEN   WBC, UA 0-5 0 - 5 WBC/hpf   RBC / HPF 0-5 0 - 5 RBC/hpf   Bacteria, UA FEW (A) NONE SEEN   Casts HYALINE CASTS (A) NEGATIVE   Urine-Other MUCOUS PRESENT   Troponin I  Result Value Ref Range   Troponin I 0.06 (HH) <0.03 ng/mL  CBC  Result Value Ref Range   WBC 8.1 4.0 - 10.5 K/uL   RBC 4.98 4.22 - 5.81 MIL/uL   Hemoglobin 13.0 13.0 - 17.0 g/dL   HCT 52.8 41.3 - 24.4 %   MCV 80.7 78.0 - 100.0 fL   MCH 26.1 26.0 - 34.0 pg   MCHC 32.3 30.0 - 36.0 g/dL   RDW 01.0 27.2 - 53.6 %   Platelets 186 150 - 400 K/uL   Laboratory interpretation all normal except + troponin, delta troponin unchanged, mild elevation of BNP, Elevation of BUN and concentrated urine consistent with dehydration    EKG  EKG Interpretation  Date/Time:  Tuesday October 22 2016 22:35:31 EDT Ventricular Rate:  80 PR Interval:    QRS Duration: 122 QT Interval:  446 QTC Calculation: 515 R Axis:   39 Text Interpretation:  Sinus rhythm Probable left atrial enlargement Nonspecific intraventricular conduction delay Nonspecific T abnormalities, lateral leads No significant change since last tracing 27 Aug 2016 Confirmed by Britnay Magnussen  MD-I, Coleen Cardiff ([id number]) on 10/22/2016 11:13:17 PM       Radiology Dg Chest 2 View  Result Date: 10/22/2016 CLINICAL DATA:  Cough, shortness of breath for 2 weeks. Near syncope tonight. History of diabetes, CHF. EXAM: CHEST  2 VIEW COMPARISON:  None. FINDINGS: Cardiac silhouette is moderately enlarged, mediastinal silhouette is nonsuspicious. Pulmonary vascular congestion without pleural effusion or focal consolidation. No pneumothorax. Soft tissue planes and included osseous structure nonsuspicious. IMPRESSION: Stable cardiomegaly and pulmonary vascular congestion. Electronically Signed   By: Awilda Metro M.D.   On: 10/22/2016 23:29    Procedures Procedures (including critical care  time)  Medications Ordered in ED Medications  heparin ADULT infusion 100 units/mL (25000 units/256mL sodium chloride 0.45%) (2,100 Units/hr Intravenous New Bag/Given 10/23/16 0515)  aspirin chewable tablet 324 mg (324 mg Oral Given 10/23/16 0310)  nitroGLYCERIN (NITROGLYN) 2 % ointment 0.5 inch (0.5 inches Topical Given 10/23/16 0310)  iopamidol (ISOVUE-370) 76 % injection 100 mL (100 mLs Intravenous Contrast Given 10/23/16 0431)  heparin bolus via infusion 3,500 Units (3,500 Units Intravenous Bolus from Bag 10/23/16 0514)     Initial Impression / Assessment and Plan / ED Course  Devoria Albe, MD has reviewed the triage vital signs and the nursing notes.  Pertinent labs & imaging results that were available during my care of the patient were reviewed by me and considered in my medical decision making (see chart for details).  Clinical Course      COORDINATION OF CARE: 11:15 PM- Pt advised of plan for treatment and pt agrees. Pt informed of his lab results. Advised to inform nurse if his symptoms worsen.  12:28 AM- Pt informed of his Troponin results. He will receive a repeat, delta Troponin for further evaluation. Recommended to follow up with his Cardiologist.    2:17 AM- discussed that his delta troponin is unchanged. We discussed going home with follow-up with  Dr. Antoine PocheHochrein or talking to the cardiologist on call, he would like to speak to the cardiologist on call. Will consult Cardiologist on call for Dr Antoine PocheHochrein for further plan of treatment.   02:39 Dr Alfonzo FellerFugim, cardiology, discussed patient and reviewed his tests and Dr Lindaann SloughHochreins last note, feels he can be discharged home and follow up with Dr Antoine PocheHochrein.   3:34 AM - After nurse ambulated pt in the ED, his O2 stats dropped down to 87 and patient felt SOB. Pt will receive CT angio of chest for evaluation for PE.   Nurse reports just sitting in his stretcher his pulse ox is dropping into the low 80's.  05:00 AM Radiology tech informs  me her CT scan malfunctioned twice trying to get his CT angio done, will start empirically on heparin and admit for V/Q scan.  05:12 Dr Arlyss Queen Smith, admit to tele, request ddimer  Final Clinical Impressions(s) / ED Diagnoses   Final diagnoses:  Hypoxia  DOE (dyspnea on exertion)  PND (paroxysmal nocturnal dyspnea)    Plan admission  Devoria AlbeIva Naziah Weckerly, MD, FACEP    I personally performed the services described in this documentation, which was scribed in my presence. The recorded information has been reviewed and considered.  Devoria AlbeIva Rozalynn Buege, MD, Concha PyoFACEP    Jaquari Reckner, MD 10/23/16 [phone]

## 2016-10-23 ENCOUNTER — Encounter (HOSPITAL_COMMUNITY): Payer: Self-pay | Admitting: *Deleted

## 2016-10-23 ENCOUNTER — Other Ambulatory Visit (HOSPITAL_COMMUNITY): Payer: Self-pay

## 2016-10-23 ENCOUNTER — Emergency Department (HOSPITAL_COMMUNITY): Payer: Self-pay

## 2016-10-23 DIAGNOSIS — E1169 Type 2 diabetes mellitus with other specified complication: Secondary | ICD-10-CM

## 2016-10-23 DIAGNOSIS — I2510 Atherosclerotic heart disease of native coronary artery without angina pectoris: Secondary | ICD-10-CM

## 2016-10-23 DIAGNOSIS — I5023 Acute on chronic systolic (congestive) heart failure: Secondary | ICD-10-CM

## 2016-10-23 DIAGNOSIS — R0902 Hypoxemia: Secondary | ICD-10-CM | POA: Diagnosis present

## 2016-10-23 DIAGNOSIS — I255 Ischemic cardiomyopathy: Secondary | ICD-10-CM

## 2016-10-23 DIAGNOSIS — E669 Obesity, unspecified: Secondary | ICD-10-CM

## 2016-10-23 DIAGNOSIS — I509 Heart failure, unspecified: Secondary | ICD-10-CM

## 2016-10-23 DIAGNOSIS — I10 Essential (primary) hypertension: Secondary | ICD-10-CM

## 2016-10-23 DIAGNOSIS — R748 Abnormal levels of other serum enzymes: Secondary | ICD-10-CM

## 2016-10-23 DIAGNOSIS — R0609 Other forms of dyspnea: Secondary | ICD-10-CM

## 2016-10-23 DIAGNOSIS — R778 Other specified abnormalities of plasma proteins: Secondary | ICD-10-CM | POA: Diagnosis present

## 2016-10-23 DIAGNOSIS — N179 Acute kidney failure, unspecified: Secondary | ICD-10-CM

## 2016-10-23 DIAGNOSIS — R7989 Other specified abnormal findings of blood chemistry: Secondary | ICD-10-CM

## 2016-10-23 LAB — TROPONIN I
TROPONIN I: 0.06 ng/mL — AB (ref <0.03)
TROPONIN I: 0.05 ng/mL — AB (ref <0.03)
TROPONIN I: 0.05 ng/mL — AB (ref <0.03)
Troponin I: 0.05 ng/mL (ref <0.03)

## 2016-10-23 LAB — URINALYSIS, ROUTINE W REFLEX MICROSCOPIC
GLUCOSE, UA: NEGATIVE mg/dL
HGB URINE DIPSTICK: NEGATIVE
KETONES UR: NEGATIVE mg/dL
LEUKOCYTES UA: NEGATIVE
Nitrite: NEGATIVE
Specific Gravity, Urine: 1.037 — ABNORMAL HIGH (ref 1.005–1.030)
pH: 6 (ref 5.0–8.0)

## 2016-10-23 LAB — GLUCOSE, CAPILLARY
GLUCOSE-CAPILLARY: 144 mg/dL — AB (ref 65–99)
Glucose-Capillary: 131 mg/dL — ABNORMAL HIGH (ref 65–99)
Glucose-Capillary: 108 mg/dL — ABNORMAL HIGH (ref 65–99)
Glucose-Capillary: 138 mg/dL — ABNORMAL HIGH (ref 65–99)

## 2016-10-23 LAB — CBC
HEMATOCRIT: 40.2 % (ref 39.0–52.0)
HEMOGLOBIN: 13 g/dL (ref 13.0–17.0)
MCH: 26.1 pg (ref 26.0–34.0)
MCHC: 32.3 g/dL (ref 30.0–36.0)
MCV: 80.7 fL (ref 78.0–100.0)
Platelets: 186 10*3/uL (ref 150–400)
RBC: 4.98 MIL/uL (ref 4.22–5.81)
RDW: 14.2 % (ref 11.5–15.5)
WBC: 8.1 10*3/uL (ref 4.0–10.5)

## 2016-10-23 LAB — BASIC METABOLIC PANEL
ANION GAP: 9 (ref 5–15)
BUN: 35 mg/dL — ABNORMAL HIGH (ref 6–20)
CHLORIDE: 105 mmol/L (ref 101–111)
CO2: 24 mmol/L (ref 22–32)
Calcium: 8.9 mg/dL (ref 8.9–10.3)
Creatinine, Ser: 1.55 mg/dL — ABNORMAL HIGH (ref 0.61–1.24)
GFR calc non Af Amer: 54 mL/min — ABNORMAL LOW (ref >60)
Glucose, Bld: 113 mg/dL — ABNORMAL HIGH (ref 65–99)
Potassium: 4 mmol/L (ref 3.5–5.1)
Sodium: 138 mmol/L (ref 135–145)

## 2016-10-23 LAB — URINE MICROSCOPIC-ADD ON

## 2016-10-23 LAB — PROTIME-INR
INR: 1.26
PROTHROMBIN TIME: 15.9 s — AB (ref 11.4–15.2)

## 2016-10-23 LAB — HEPARIN LEVEL (UNFRACTIONATED): HEPARIN UNFRACTIONATED: 0.54 [IU]/mL (ref 0.30–0.70)

## 2016-10-23 LAB — APTT: aPTT: 27 seconds (ref 24–36)

## 2016-10-23 LAB — D-DIMER, QUANTITATIVE: D-Dimer, Quant: 0.52 ug/mL-FEU — ABNORMAL HIGH (ref 0.00–0.50)

## 2016-10-23 MED ORDER — NITROGLYCERIN 0.4 MG SL SUBL: 0.4000 mg | SUBLINGUAL_TABLET | SUBLINGUAL | Status: DC | PRN

## 2016-10-23 MED ORDER — ASPIRIN 81 MG PO CHEW
324.0000 mg | CHEWABLE_TABLET | Freq: Once | ORAL | Status: AC
  Administered 2016-10-23: 324 mg via ORAL
  Filled 2016-10-23: qty 4

## 2016-10-23 MED ORDER — INSULIN ASPART 100 UNIT/ML ~~LOC~~ SOLN
0.0000 [IU] | Freq: Three times a day (TID) | SUBCUTANEOUS | Status: DC
  Administered 2016-10-23 – 2016-10-25 (×5): 1 [IU] via SUBCUTANEOUS
  Administered 2016-10-26 (×2): 2 [IU] via SUBCUTANEOUS
  Administered 2016-10-26 – 2016-10-27 (×2): 1 [IU] via SUBCUTANEOUS
  Administered 2016-10-27 – 2016-10-28 (×4): 2 [IU] via SUBCUTANEOUS
  Administered 2016-10-28: 1 [IU] via SUBCUTANEOUS
  Administered 2016-10-29 – 2016-10-30 (×4): 2 [IU] via SUBCUTANEOUS
  Administered 2016-10-30: 3 [IU] via SUBCUTANEOUS

## 2016-10-23 MED ORDER — IOPAMIDOL (ISOVUE-370) INJECTION 76%
100.0000 mL | Freq: Once | INTRAVENOUS | Status: AC | PRN
  Administered 2016-10-23: 100 mL via INTRAVENOUS

## 2016-10-23 MED ORDER — FUROSEMIDE 10 MG/ML IJ SOLN
40.0000 mg | Freq: Two times a day (BID) | INTRAMUSCULAR | Status: DC
  Administered 2016-10-23 – 2016-10-24 (×2): 40 mg via INTRAVENOUS
  Filled 2016-10-23 (×2): qty 4

## 2016-10-23 MED ORDER — SODIUM CHLORIDE 0.9% FLUSH
3.0000 mL | Freq: Two times a day (BID) | INTRAVENOUS | Status: DC
  Administered 2016-10-23 – 2016-10-25 (×4): 3 mL via INTRAVENOUS

## 2016-10-23 MED ORDER — PANTOPRAZOLE SODIUM 40 MG PO TBEC
40.0000 mg | DELAYED_RELEASE_TABLET | Freq: Every day | ORAL | Status: DC
  Administered 2016-10-23 – 2016-10-30 (×8): 40 mg via ORAL
  Filled 2016-10-23 (×8): qty 1

## 2016-10-23 MED ORDER — CARVEDILOL 25 MG PO TABS
25.0000 mg | ORAL_TABLET | Freq: Two times a day (BID) | ORAL | Status: DC
  Administered 2016-10-23 – 2016-10-25 (×6): 25 mg via ORAL
  Filled 2016-10-23 (×6): qty 1

## 2016-10-23 MED ORDER — SODIUM CHLORIDE 0.9% FLUSH: 3.0000 mL | INTRAVENOUS | Status: DC | PRN

## 2016-10-23 MED ORDER — HEPARIN (PORCINE) IN NACL 100-0.45 UNIT/ML-% IJ SOLN
2100.0000 [IU]/h | INTRAMUSCULAR | Status: DC
  Administered 2016-10-23: 2100 [IU]/h via INTRAVENOUS
  Filled 2016-10-23: qty 250

## 2016-10-23 MED ORDER — FUROSEMIDE 10 MG/ML IJ SOLN
40.0000 mg | INTRAMUSCULAR | Status: AC
  Administered 2016-10-23: 40 mg via INTRAVENOUS
  Filled 2016-10-23: qty 4

## 2016-10-23 MED ORDER — NITROGLYCERIN 2 % TD OINT
0.5000 [in_us] | TOPICAL_OINTMENT | Freq: Once | TRANSDERMAL | Status: AC
  Administered 2016-10-23: 0.5 [in_us] via TOPICAL
  Filled 2016-10-23: qty 1

## 2016-10-23 MED ORDER — SODIUM CHLORIDE 0.9 % IV SOLN: 250.0000 mL | INTRAVENOUS | Status: DC | PRN

## 2016-10-23 MED ORDER — SPIRONOLACTONE 25 MG PO TABS
50.0000 mg | ORAL_TABLET | Freq: Every day | ORAL | Status: DC
  Administered 2016-10-23 – 2016-10-30 (×8): 50 mg via ORAL
  Filled 2016-10-23 (×3): qty 2
  Filled 2016-10-23: qty 1
  Filled 2016-10-23 (×2): qty 2
  Filled 2016-10-23: qty 1
  Filled 2016-10-23 (×3): qty 2
  Filled 2016-10-23: qty 1

## 2016-10-23 MED ORDER — ISOSORBIDE MONONITRATE ER 30 MG PO TB24
30.0000 mg | ORAL_TABLET | Freq: Every day | ORAL | Status: DC
  Administered 2016-10-23 – 2016-10-30 (×8): 30 mg via ORAL
  Filled 2016-10-23 (×9): qty 1

## 2016-10-23 MED ORDER — ONDANSETRON HCL 4 MG/2ML IJ SOLN: 4.0000 mg | Freq: Four times a day (QID) | INTRAMUSCULAR | Status: DC | PRN

## 2016-10-23 MED ORDER — ACETAMINOPHEN 325 MG PO TABS: 650.0000 mg | ORAL_TABLET | ORAL | Status: DC | PRN

## 2016-10-23 MED ORDER — HEPARIN BOLUS VIA INFUSION
3500.0000 [IU] | Freq: Once | INTRAVENOUS | Status: AC
  Administered 2016-10-23: 3500 [IU] via INTRAVENOUS
  Filled 2016-10-23: qty 3500

## 2016-10-23 MED ORDER — ASPIRIN EC 325 MG PO TBEC
325.0000 mg | DELAYED_RELEASE_TABLET | Freq: Every day | ORAL | Status: DC
  Administered 2016-10-23 – 2016-10-24 (×2): 325 mg via ORAL
  Filled 2016-10-23 (×3): qty 1

## 2016-10-23 MED ORDER — GUAIFENESIN ER 600 MG PO TB12
600.0000 mg | ORAL_TABLET | Freq: Two times a day (BID) | ORAL | Status: DC
  Administered 2016-10-23 – 2016-10-30 (×15): 600 mg via ORAL
  Filled 2016-10-23 (×15): qty 1

## 2016-10-23 NOTE — Hospital Discharge Follow-Up (Signed)
[facility]:  This Case Manager spoke with Lanier ClamKathy Mahabir, RN CM who indicated patient needs a hospital follow-up appointment for acute exacerbation of systolic congestive heart failure, hypoxia, acute renal failure on chronic kidney disease stage III. Patient is uninsured and does not have a PCP. Discussed the medical management and resources (including onsite pharmacy, Financial Counseling) available at [facility]. Patient appreciative of information and agreeable to appointment being scheduled. Appointment scheduled for 10/29/16 at 1130 with Dr. Venetia NightAmao. AVS updated. Lanier ClamKathy Mahabir, RN CM also updated.

## 2016-10-23 NOTE — H&P (Addendum)
History and Physical    Glenn McgeeHarold Garritano ZOX:[id number] DOB: 13-Jan-1971 DOA: 10/22/2016  Referring MD/NP/PA: Dr. Lynelle DoctorKnapp PCP: No PCP Per Patient  Patient coming from: Home  Chief Complaint: Shortness of breath  HPI: [name] is a 45 y.o. male with medical history significant of HTN, systolic CHF last EF 35-40% in 12/2014, ischemic cardiomyopathy, MI, CAD, and HLD; who presents with complaints of intermittent shortness of breath over the last 3-4 weeks. Symptoms usually worsened with exertion and improved with rest. He reports feeling as though intermittently when he lays down to sleep that he has to force himself to breathe and when he sits up he feels fine. Never he exerts himself he complains of feeling of fullness in his belly. Patient had reportedly complained of similar symptoms of shortness of breath last month for which he was evaluated by his cardiologist Dr. Antoine PocheHochrein and was restarted on spironolactone at that time. This medication had previously been started and he felt that it was messing with his eyesight. Since restarting this medication patient denies any recurrence of symptoms. He reports taking all of his medications as prescribed on a regular basis. Over the last 1-2 weeks the patient had not been routinely checking his weight at home and notes that he normally weighs around 290 pounds. Review of patient's last office note had him around 293 pounds. Associated symptoms include productive cough with greenish sputum production over the last 3-4 days and sore throat. He denies any lightheadedness, fever, chills, diarrhea, nausea, vomiting, recent travel greater than 4 hours, calf pain, or significant leg swelling.   ED Course: Upon admission to the emergency department the patient was seen to be afebrile, respirations 13-25, O2 saturations as low as 84% with ambulation, weight 308 pounds, and all other vitals relatively within normal limits. Lab work was obtained and revealed WBC 10.7,  CO2 21, BUN 35, creatinine 1.7, glucose 135, BNP 755, troponin 0.06. EKG showed sinus rhythm with no significant ischemic changes. Chest x-ray showed stable cardiomegaly with pulmonary congestion. Patient was started on a heparin drip for the possibility of pulmonary embolus as CT angiogram was not able to be obtained after 2 attempts. D-dimer was subsequently ordered and seen to be mildly elevated at 0.52. TRH called to admit.  Review of Systems: As per HPI otherwise 10 point review of systems negative.   Past Medical History:  Diagnosis Date  . CAD (coronary artery disease)    NSTEMI 10/2011: LHC 11/04/11: pLAD 90%, mLAD 60-70%, small D2 sub totally occluded at ostium, small OM1 90% ostial, 90% mid, mOM2 30%, oPL 80%, RCA 30%, dPDA 80%, EF 20% with ant AK.  PCI:  Promus DES to pLAD.  [name]. Chronic systolic heart failure (HCC)   . DM2 (diabetes mellitus, type 2) (HCC)   . HTN (hypertension)   . Hyperlipidemia   . Ischemic cardiomyopathy    Echo 11/03/11: mod LVH, mild focal basal septal hypertrophy, EF 15%, grade 2 diast dysfxn, mild MR, mild to mod LAE, mild RVE, mild to mod reduced RVSF.  EF 3/5 50% by echo  . Obesity     Past Surgical History:  Procedure Laterality Date  . LEFT AND RIGHT HEART CATHETERIZATION WITH CORONARY ANGIOGRAM N/A 11/04/2011   Procedure: LEFT AND RIGHT HEART CATHETERIZATION WITH CORONARY ANGIOGRAM;  Surgeon: Rollene RotundaJames Hochrein, MD;  [facility];  Service: Cardiovascular;  Laterality: N/A;  . NONE    . PERCUTANEOUS CORONARY STENT INTERVENTION (PCI-S)  11/04/2011   Procedure: PERCUTANEOUS CORONARY STENT INTERVENTION (  PCI-S);  Surgeon: Tonny Bollman, MD;  Location: [facility] CATH LAB;  Service: Cardiovascular;;     reports that he has never smoked. He does not have any smokeless tobacco history on file. He reports that he drinks alcohol. He reports that he does not use drugs.  Allergies  Allergen Reactions  . Spironolactone     Dry eyes    Family History  Problem  Relation Age of Onset  . Heart disease Mother     MOTHER HAD CABG    Prior to Admission medications   Medication Sig Start Date End Date Taking? Authorizing Provider  aspirin EC 325 MG tablet Take 1 tablet (325 mg total) by mouth daily. 06/02/13  Yes Rollene Rotunda, MD  carvedilol (COREG) 25 MG tablet TAKE ONE TABLET BY MOUTH TWICE DAILY 04/15/16  Yes Rollene Rotunda, MD  furosemide (LASIX) 20 MG tablet Take 1 tablet (20 mg total) by mouth daily. KEEP OV. 08/09/16  Yes Rollene Rotunda, MD  glipiZIDE (GLUCOTROL) 10 MG tablet Take 1 tablet (10 mg total) by mouth 2 (two) times daily before a meal. 02/16/16  Yes Rollene Rotunda, MD  isosorbide mononitrate (IMDUR) 30 MG 24 hr tablet Take 1 tablet (30 mg total) by mouth daily. 07/11/16  Yes Dyann Kief, PA-C  lisinopril (PRINIVIL,ZESTRIL) 20 MG tablet Take 20 mg by mouth 2 (two) times daily.   Yes Historical Provider, MD  nitroGLYCERIN (NITROSTAT) 0.4 MG SL tablet Place 1 tablet (0.4 mg total) under the tongue every 5 (five) minutes x 3 doses as needed for chest pain. 11/06/11 10/23/16 Yes Hollice Espy, MD  omeprazole (PRILOSEC) 20 MG capsule Take 20 mg by mouth daily.   Yes Historical Provider, MD  spironolactone (ALDACTONE) 50 MG tablet TAKE ONE TABLET BY MOUTH ONCE DAILY 10/14/16  Yes Rollene Rotunda, MD  amLODipine (NORVASC) 10 MG tablet Take 1 tablet (10 mg total) by mouth daily. Patient not taking: Reported on 10/23/2016 02/16/16   Rollene Rotunda, MD  spironolactone (ALDACTONE) 25 MG tablet Take 1 tablet (25 mg total) by mouth daily. Patient not taking: Reported on 10/23/2016 09/05/16 12/04/16  Rollene Rotunda, MD    Physical Exam:     Constitutional: Morbidly obese male in NAD, calm, comfortable Vitals:   10/23/16 0344 10/23/16 0400 10/23/16 0513 10/23/16 0537  BP: 118/87 124/91 126/92   Pulse: 70 74 73   Resp:  18 16   Temp:      TempSrc:      SpO2: 95% 98% 94%   Weight:    (!) 139.7 kg (308 lb)  Height:       Eyes: PERRL, lids and  conjunctivae normal ENMT: Mucous membranes are moist. Posterior pharynx clear of any exudate or lesions.Normal dentition.  Neck: normal, supple, no masses, no thyromegaly Respiratory: Decreased overall air movement with some fine crackles appreciated. Normal respiratory effort. No accessory muscle use.  Cardiovascular: Regular rate and rhythm, no murmurs / rubs / gallops. +1 to +2 pitting edema of the bilateral lower extremities noted. 2+ pedal pulses. No carotid bruits.  Abdomen: no tenderness, no masses palpated. No hepatosplenomegaly. Bowel sounds positive.  Musculoskeletal: no clubbing / cyanosis. No joint deformity upper and lower extremities. Good ROM, no contractures. Normal muscle tone.  Skin: no rashes, lesions, ulcers. No induration Neurologic: CN 2-12 grossly intact. Sensation intact, DTR normal. Strength 5/5 in all 4.  Psychiatric: Normal judgment and insight. Alert and oriented x 3. Normal mood.     Labs on Admission: I have personally  reviewed following labs and imaging studies  CBC:  Recent Labs Lab 10/22/16 2246 10/23/16 0505  WBC 10.7* 8.1  HGB 14.1 13.0  HCT 41.2 40.2  MCV 77.7* 80.7  PLT 207 186   Basic Metabolic Panel:  Recent Labs Lab 10/22/16 2246  NA 138  K 4.4  CL 107  CO2 21*  GLUCOSE 135*  BUN 35*  CREATININE 1.70*  CALCIUM 9.0   GFR: Estimated Creatinine Clearance (by C-G formula based on SCr of 1.7 mg/dL (H)) Male: 16.1 mL/min Male: 87.5 mL/min Liver Function Tests:  Recent Labs Lab 10/22/16 2246  AST 30  ALT 35  ALKPHOS 38  BILITOT 1.1  PROT 6.9  ALBUMIN 3.9    Recent Labs Lab 10/22/16 2246  LIPASE 34   No results for input(s): AMMONIA in the last 168 hours. Coagulation Profile:  Recent Labs Lab 10/23/16 0505  INR 1.26   Cardiac Enzymes:  Recent Labs Lab 10/22/16 2246 10/23/16 0117  TROPONINI 0.06* 0.06*   BNP (last 3 results) No results for input(s): PROBNP in the last 8760 hours. HbA1C: No results for  input(s): HGBA1C in the last 72 hours. CBG: No results for input(s): GLUCAP in the last 168 hours. Lipid Profile: No results for input(s): CHOL, HDL, LDLCALC, TRIG, CHOLHDL, LDLDIRECT in the last 72 hours. Thyroid Function Tests: No results for input(s): TSH, T4TOTAL, FREET4, T3FREE, THYROIDAB in the last 72 hours. Anemia Panel: No results for input(s): VITAMINB12, FOLATE, FERRITIN, TIBC, IRON, RETICCTPCT in the last 72 hours. Urine analysis:    Component Value Date/Time   COLORURINE AMBER (A) 10/22/2016 2327   APPEARANCEUR CLEAR 10/22/2016 2327   LABSPEC 1.037 (H) 10/22/2016 2327   PHURINE 6.0 10/22/2016 2327   GLUCOSEU NEGATIVE 10/22/2016 2327   HGBUR NEGATIVE 10/22/2016 2327   BILIRUBINUR SMALL (A) 10/22/2016 2327   KETONESUR NEGATIVE 10/22/2016 2327   PROTEINUR >300 (A) 10/22/2016 2327   UROBILINOGEN 0.2 12/10/2010 2012   NITRITE NEGATIVE 10/22/2016 2327   LEUKOCYTESUR NEGATIVE 10/22/2016 2327   Sepsis Labs: No results found for this or any previous visit (from the past 240 hour(s)).   Radiological Exams on Admission: Dg Chest 2 View  Result Date: 10/22/2016 CLINICAL DATA:  Cough, shortness of breath for 2 weeks. Near syncope tonight. History of diabetes, CHF. EXAM: CHEST  2 VIEW COMPARISON:  None. FINDINGS: Cardiac silhouette is moderately enlarged, mediastinal silhouette is nonsuspicious. Pulmonary vascular congestion without pleural effusion or focal consolidation. No pneumothorax. Soft tissue planes and included osseous structure nonsuspicious. IMPRESSION: Stable cardiomegaly and pulmonary vascular congestion. Electronically Signed   By: Awilda Metro M.D.   On: 10/22/2016 23:29    EKG: Independently reviewed. Sinus rhythm with nonspecific T-wave abnormalities and prolonged QTC of 515.  Assessment/Plan Acute exacerbation of systolic congestive heart failure: Patient reports intermittent shortness of breath the last 3 or 4 weeks for which he was previously seen and  was restarted on spironolactone in 08/2016 by Dr. Antoine Poche. Patient appears to have gained 10-15 pounds and has lower extremity edema on physical exam. Chest x-ray showing cardiomegaly with pulmonary vascular congestion. Lab work reveals elevated BNP of 700. Review of record shows that patient EF had previously been as low as 15% and at last check was 35 -40% with grade 1 diastolic dysfunction back in 12/2014. - Admit to a telemetry bed - Congestive heart failure protocol initiated  - Strict ins and outs and daily weights - Lasix 40 mg IV q12hrs - Continue Coreg, isosorbide mononitrate, and spironolactone as  tolerated - Check echocardiogram - Will need to consult cardiology in a.m.  Hypoxia/Dyspnea on exertion: Seen to have O2 saturations as low as 84% on room air with ambulation. Suspect this is secondary to above. - Continuous pulse oximetry with nasal cannula oxygen as needed to keep O2 sats greater than 92%  Acute renal failure on chronic kidney disease stage III: Creatinine 1.7 with BUN elevated at 35. Baseline kidney function had previously been seen to be around 1.2 -1.3. Question if this is secondary to CHF passive congestion - Recheck BMP in a.m.  Elevated troponin: Patient's initial troponin is 0.06 on admission. EKG with nonspecific T-wave normalities.  - Trend troponins  Elevated d-dimer: D-dimer elevated at 0.52. Given patient history of suspected this is a low probability.  - Continued heparin drip, until can reevaluate for response to treatment for CHF which is the most suspected cause of symptoms - Discontinue heparin drip if pulmonary embolus indeed appears to be low probability - Consider VQ scan if still concerned of possibility of PE   Leukocytosis: Resolved - Continue to monitor  Suspected viral URI - Symptomatic treatment  CAD/ischemic cardiomyopathy - Continue aspirin  Diabetes mellitus type 2 - Hypoglycemic protocols - Held glipizide - CBGs every before meals  with sensitive SSI insulin for now  Prolonged QTC of 515 - Continue to monitor  GERD - Continue omeprazole  DVT prophylaxis:  heparin drip per protocol Code Status: full Family Communication: No family present at bedside Disposition Plan: Likely discharge home once medically stable Consults called: none Admission status: Telemetry observation  Clydie Braun MD [facility] Pager [phone]  If 7PM-7AM, please contact night-coverage [email or web address] Password [email or web address]  10/23/2016, 5:42 AM

## 2016-10-23 NOTE — Progress Notes (Addendum)
ANTICOAGULATION CONSULT NOTE Pharmacy Consult for Heparin Indication: pulmonary embolus  No Known Allergies  Patient Measurements: Height: 6\' 6"  (198.1 cm) Weight: (!) 306 lb 9.6 oz (139.1 kg) IBW/kg (Calculated) : 91.4 Heparin Dosing Weight: 122 kg  Vital Signs: BP: 132/104 (10/25 0700) Pulse Rate: 73 (10/25 0513)  Labs:  Recent Labs  10/22/16 2246 10/23/16 0117 10/23/16 0505 10/23/16 0809 10/23/16 1249  HGB 14.1  --  13.0  --   --   HCT 41.2  --  40.2  --   --   PLT 207  --  186  --   --   APTT  --   --  27  --   --   LABPROT  --   --  15.9*  --   --   INR  --   --  1.26  --   --   HEPARINUNFRC  --   --   --   --  0.54  CREATININE 1.70*  --   --   --  1.55*  TROPONINI 0.06* 0.06*  --  0.05* 0.05*    Estimated Creatinine Clearance (by C-G formula based on SCr of 1.55 mg/dL (H)) Male: 16.180.5 mL/min Male: 97 mL/min   Medications:  Infusions:     Assessment: 45 y.o. male with medical history significant of HTN, systolic CHF last EF 35-40% in 12/2014, ischemic cardiomyopathy, MI, CAD, and HLD; who presents with complaints of intermittent shortness of breath over the last 3-4 weeks.  Most likely d/t CHF but would like to r/o PE with elevated d-dimer, and consider stopping heparin if SOB responds to CHF treatment  Per Cards, PE unlikely and ok to stop heparin   Baseline INR, aPTT: wnl  Prior anticoagulation: none, but on ASA 325 PTA  Significant events:  Today, 10/23/2016:  CBC: wnl  Most recent heparin level therapeutic on 2100 units/hr  No bleeding or infusion issues per nursing  CrCl: > 60 ml/min  Goal of Therapy: Heparin level 0.3-0.7 units/ml Monitor platelets by anticoagulation protocol: Yes  Plan:  Agree with stopping heparin    Bernadene Personrew Cicero Noy, PharmD Pager: [phone] 10/23/2016, 1:36 PM

## 2016-10-23 NOTE — Progress Notes (Signed)
ANTICOAGULATION CONSULT NOTE - Initial Consult  Pharmacy Consult for Heparin Indication: pulmonary embolus  Allergies  Allergen Reactions  . Spironolactone     Dry eyes    Patient Measurements: Height: 6\' 5"  (195.6 cm) Weight: 293 lb (132.9 kg) IBW/kg (Calculated) : 89.1 Heparin Dosing Weight:   Vital Signs: Temp: 97.6 F (36.4 C) (10/24 2247) Temp Source: Oral (10/24 2247) BP: 124/91 (10/25 0400) Pulse Rate: 74 (10/25 0400)  Labs:  Recent Labs  10/22/16 2246 10/23/16 0117  HGB 14.1  --   HCT 41.2  --   PLT 207  --   CREATININE 1.70*  --   TROPONINI 0.06* 0.06*    Estimated Creatinine Clearance (by C-G formula based on SCr of 1.7 mg/dL (H)) Male: 08.670.7 mL/min Male: 85.3 mL/min   Medical History: Past Medical History:  Diagnosis Date  . CAD (coronary artery disease)    NSTEMI 10/2011: LHC 11/04/11: pLAD 90%, mLAD 60-70%, small D2 sub totally occluded at ostium, small OM1 90% ostial, 90% mid, mOM2 30%, oPL 80%, RCA 30%, dPDA 80%, EF 20% with ant AK.  PCI:  Promus DES to pLAD.  [name]. Chronic systolic heart failure (HCC)   . DM2 (diabetes mellitus, type 2) (HCC)   . HTN (hypertension)   . Hyperlipidemia   . Ischemic cardiomyopathy    Echo 11/03/11: mod LVH, mild focal basal septal hypertrophy, EF 15%, grade 2 diast dysfxn, mild MR, mild to mod LAE, mild RVE, mild to mod reduced RVSF.  EF 3/5 50% by echo  . Obesity     Medications:  Infusions:  . heparin      Assessment: Patient with SOB in ED.  MD wants pharmacy to dose heparin for PE.  No oral anticoagulants noted on med rec.   Baseline coags ordered.  Goal of Therapy:  Heparin level 0.3-0.7 units/ml Monitor platelets by anticoagulation protocol: Yes   Plan:  Heparin bolus 3500  units iv x1 Heparin drip at 2100  units/hr Daily CBC Next heparin level at 1200     [address]Parilee Hally Jr, Jacquenette ShoneJulian Crowford 10/23/2016,5:00 AM

## 2016-10-23 NOTE — ED Notes (Signed)
Pt noted to drop from 100% on RA to 87% while ambulating. Dr. Lynelle DoctorKnapp made aware.

## 2016-10-23 NOTE — ED Notes (Signed)
Hospitalist at bedside 

## 2016-10-23 NOTE — Progress Notes (Signed)
Glenn McgeeHarold [name] is a 45 y.o. male with medical history significant of HTN, systolic CHF last EF 35-40% in 12/2014, ischemic cardiomyopathy, MI, CAD, and HLD; who presents with complaints of intermittent shortness of breath over the last 3-4 weeks. he was admitted for acute on chronic chf exacerbation.  He was admitted earlier today, please see Dr Michaelle CopasSmith's note in detail.   Cardiology consulted, and an echocardiogram ordered.   Glenn ModyVijaya Shalice Woodring MD [phone]

## 2016-10-23 NOTE — Consult Note (Signed)
CONSULTATION NOTE  Reason for Consult: CHF  Requesting Physician: Dr. Karleen Hampshire  Cardiologist: Dr. Percival Spanish  HPI: This is a 45 y.o. male with a past medical history significant for artery disease with NSTEMI in November 2012, this demonstrated multivessel coronary disease and she underwent PCI with a Promus DES to the proximal LAD. She also has an ischemic cardiomyopathy with EF most recently 35-40% by echo with grade 2 diastolic dysfunction. EF has been as low as 15% in the past. There are also cardiac risk factors including type 2 diabetes, hypertension, dyslipidemia and obesity. He's had trouble with compliance with medications in the past for various reasons. He recently saw Dr. Percival Spanish in the office and it was suggested that he could have undergone further testing however he did not want to pursue that because of cost issues. He was felt to be euvolemic. He was placed on spironolactone. It was felt that he most definitely has sleep apnea but he could not afford treatment. He now presents with pre-4 weeks of worsening shortness of breath, 8 pound weight gain and sore throat with productive cough of greenish sputum. Initial labs indicated elevated BNP of 755 and mildly elevated troponin of 0.06, 0.06, and 0.05. EKG shows sinus rhythm without ischemia and chest x-ray showed stable cardiomegaly with pulmonary congestion. D-dimer elevated at 0.52, however he has no clinical symptoms to support pulmonary embolus. He has already diuresed 1.4 L negative since this morning and feels much better.   PMHx:  Past Medical History:  Diagnosis Date  . CAD (coronary artery disease)    NSTEMI 10/2011: LHC 11/04/11: pLAD 90%, mLAD 60-70%, small D2 sub totally occluded at ostium, small OM1 90% ostial, 90% mid, mOM2 30%, oPL 80%, RCA 30%, dPDA 80%, EF 20% with ant AK.  PCI:  Promus DES to pLAD.  [name] Chronic systolic heart failure ([place])   . DM2 (diabetes mellitus, type 2) ([place])   . HTN (hypertension)   .  Hyperlipidemia   . Ischemic cardiomyopathy    Echo 11/03/11: mod LVH, mild focal basal septal hypertrophy, EF 15%, grade 2 diast dysfxn, mild MR, mild to mod LAE, mild RVE, mild to mod reduced RVSF.  EF 3/5 50% by echo  . Obesity    Past Surgical History:  Procedure Laterality Date  . LEFT AND RIGHT HEART CATHETERIZATION WITH CORONARY ANGIOGRAM N/A 11/04/2011   Procedure: LEFT AND RIGHT HEART CATHETERIZATION WITH CORONARY ANGIOGRAM;  Surgeon: Minus Breeding, MD;  Location: [facility] CATH LAB;  Service: Cardiovascular;  Laterality: N/A;  . NONE    . PERCUTANEOUS CORONARY STENT INTERVENTION (PCI-S)  11/04/2011   Procedure: PERCUTANEOUS CORONARY STENT INTERVENTION (PCI-S);  Surgeon: Sherren Mocha, MD;  Location: [facility] CATH LAB;  Service: Cardiovascular;;    FAMHx: Family History  Problem Relation Age of Onset  . Heart disease Mother     MOTHER HAD CABG    SOCHx:  reports that he has never smoked. He has never used smokeless tobacco. He reports that he drinks alcohol. He reports that he does not use drugs.  ALLERGIES: No Known Allergies  ROS: Pertinent items noted in HPI and remainder of comprehensive ROS otherwise negative.  HOME MEDICATIONS: No current facility-administered medications on file prior to encounter.    Current Outpatient Prescriptions on File Prior to Encounter  Medication Sig Dispense Refill  . aspirin EC 325 MG tablet Take 1 tablet (325 mg total) by mouth daily. 30 tablet 0  . carvedilol (COREG) 25 MG tablet TAKE ONE TABLET BY  MOUTH TWICE DAILY 180 tablet 3  . furosemide (LASIX) 20 MG tablet Take 1 tablet (20 mg total) by mouth daily. KEEP OV. 30 tablet 0  . glipiZIDE (GLUCOTROL) 10 MG tablet Take 1 tablet (10 mg total) by mouth 2 (two) times daily before a meal. 180 tablet 3  . isosorbide mononitrate (IMDUR) 30 MG 24 hr tablet Take 1 tablet (30 mg total) by mouth daily. 90 tablet 3  . lisinopril (PRINIVIL,ZESTRIL) 20 MG tablet Take 20 mg by mouth 2 (two) times daily.    .  nitroGLYCERIN (NITROSTAT) 0.4 MG SL tablet Place 1 tablet (0.4 mg total) under the tongue every 5 (five) minutes x 3 doses as needed for chest pain. 20 tablet 1  . omeprazole (PRILOSEC) 20 MG capsule Take 20 mg by mouth daily.    [name] spironolactone (ALDACTONE) 50 MG tablet TAKE ONE TABLET BY MOUTH ONCE DAILY 30 tablet 5  . amLODipine (NORVASC) 10 MG tablet Take 1 tablet (10 mg total) by mouth daily. (Patient not taking: Reported on 10/23/2016) 90 tablet 3  . spironolactone (ALDACTONE) 25 MG tablet Take 1 tablet (25 mg total) by mouth daily. (Patient not taking: Reported on 10/23/2016) 30 tablet 11    HOSPITAL MEDICATIONS: I have reviewed the patient's current medications.  VITALS: Blood pressure (!) 132/104, pulse 73, temperature 97.6 F (36.4 C), temperature source Oral, resp. rate 14, height '6\' 6"'$  (1.981 m), weight (!) 306 lb 9.6 oz (139.1 kg), SpO2 97 %.  PHYSICAL EXAM: General appearance: alert, no distress and moderately obese Neck: JVD - 3 cm above sternal notch and no carotid bruit Lungs: diminished breath sounds bibasilar Heart: regular rate and rhythm Abdomen: soft, non-tender; bowel sounds normal; no masses,  no organomegaly and Obese Extremities: edema 1+ bilateral pitting edema Pulses: 2+ and symmetric Skin: Cool, dry Neurologic: Grossly normal Psych: Pleasant  LABS: Results for orders placed or performed during the hospital encounter of 10/22/16 (from the past 48 hour(s))  Lipase, blood     Status: None   Collection Time: 10/22/16 10:46 PM  Result Value Ref Range   Lipase 34 11 - 51 U/L  Comprehensive metabolic panel     Status: Abnormal   Collection Time: 10/22/16 10:46 PM  Result Value Ref Range   Sodium 138 135 - 145 mmol/L   Potassium 4.4 3.5 - 5.1 mmol/L   Chloride 107 101 - 111 mmol/L   CO2 21 (L) 22 - 32 mmol/L   Glucose, Bld 135 (H) 65 - 99 mg/dL   BUN 35 (H) 6 - 20 mg/dL   Creatinine, Ser 1.70 (H) 0.61 - 1.24 mg/dL   Calcium 9.0 8.9 - 10.3 mg/dL   Total  Protein 6.9 6.5 - 8.1 g/dL   Albumin 3.9 3.5 - 5.0 g/dL   AST 30 15 - 41 U/L   ALT 35 17 - 63 U/L   Alkaline Phosphatase 38 38 - 126 U/L   Total Bilirubin 1.1 0.3 - 1.2 mg/dL   GFR calc non Af Amer 48 (L) >60 mL/min   GFR calc Af Amer 56 (L) >60 mL/min    Comment: (NOTE) The eGFR has been calculated using the CKD EPI equation. This calculation has not been validated in all clinical situations. eGFR's persistently <60 mL/min signify possible Chronic Kidney Disease.    Anion gap 10 5 - 15  CBC     Status: Abnormal   Collection Time: 10/22/16 10:46 PM  Result Value Ref Range   WBC 10.7 (H) 4.0 -  10.5 K/uL   RBC 5.30 4.22 - 5.81 MIL/uL   Hemoglobin 14.1 13.0 - 17.0 g/dL   HCT 41.2 39.0 - 52.0 %   MCV 77.7 (L) 78.0 - 100.0 fL   MCH 26.6 26.0 - 34.0 pg   MCHC 34.2 30.0 - 36.0 g/dL   RDW 13.9 11.5 - 15.5 %   Platelets 207 150 - 400 K/uL  Brain natriuretic peptide     Status: Abnormal   Collection Time: 10/22/16 10:46 PM  Result Value Ref Range   B Natriuretic Peptide 755.4 (H) 0.0 - 100.0 pg/mL  Troponin I     Status: Abnormal   Collection Time: 10/22/16 10:46 PM  Result Value Ref Range   Troponin I 0.06 (HH) <0.03 ng/mL    Comment: CRITICAL RESULT CALLED TO, READ BACK BY AND VERIFIED WITH: ABBIE LEDWELL,RN [id number] @ 2343 BY J SCOTTON   Urinalysis, Routine w reflex microscopic     Status: Abnormal   Collection Time: 10/22/16 11:27 PM  Result Value Ref Range   Color, Urine AMBER (A) YELLOW    Comment: BIOCHEMICALS MAY BE AFFECTED BY COLOR   APPearance CLEAR CLEAR   Specific Gravity, Urine 1.037 (H) 1.005 - 1.030   pH 6.0 5.0 - 8.0   Glucose, UA NEGATIVE NEGATIVE mg/dL   Hgb urine dipstick NEGATIVE NEGATIVE   Bilirubin Urine SMALL (A) NEGATIVE   Ketones, ur NEGATIVE NEGATIVE mg/dL   Protein, ur >300 (A) NEGATIVE mg/dL   Nitrite NEGATIVE NEGATIVE   Leukocytes, UA NEGATIVE NEGATIVE  Urine microscopic-add on     Status: Abnormal   Collection Time: 10/22/16 11:27 PM  Result  Value Ref Range   Squamous Epithelial / LPF 0-5 (A) NONE SEEN   WBC, UA 0-5 0 - 5 WBC/hpf   RBC / HPF 0-5 0 - 5 RBC/hpf   Bacteria, UA FEW (A) NONE SEEN   Casts HYALINE CASTS (A) NEGATIVE    Comment: GRANULAR CAST   Urine-Other MUCOUS PRESENT   Troponin I     Status: Abnormal   Collection Time: 10/23/16  1:17 AM  Result Value Ref Range   Troponin I 0.06 (HH) <0.03 ng/mL    Comment: CRITICAL VALUE NOTED.  VALUE IS CONSISTENT WITH PREVIOUSLY REPORTED AND CALLED VALUE.  APTT     Status: None   Collection Time: 10/23/16  5:05 AM  Result Value Ref Range   aPTT 27 24 - 36 seconds  Protime-INR     Status: Abnormal   Collection Time: 10/23/16  5:05 AM  Result Value Ref Range   Prothrombin Time 15.9 (H) 11.4 - 15.2 seconds   INR 1.26   CBC     Status: None   Collection Time: 10/23/16  5:05 AM  Result Value Ref Range   WBC 8.1 4.0 - 10.5 K/uL   RBC 4.98 4.22 - 5.81 MIL/uL   Hemoglobin 13.0 13.0 - 17.0 g/dL   HCT 40.2 39.0 - 52.0 %   MCV 80.7 78.0 - 100.0 fL   MCH 26.1 26.0 - 34.0 pg   MCHC 32.3 30.0 - 36.0 g/dL   RDW 14.2 11.5 - 15.5 %   Platelets 186 150 - 400 K/uL  D-dimer, quantitative     Status: Abnormal   Collection Time: 10/23/16  5:05 AM  Result Value Ref Range   D-Dimer, Quant 0.52 (H) 0.00 - 0.50 ug/mL-FEU    Comment: (NOTE) At the manufacturer cut-off of 0.50 ug/mL FEU, this assay has been documented to exclude PE with  a sensitivity and negative predictive value of 97 to 99%.  At this time, this assay has not been approved by the FDA to exclude DVT/VTE. Results should be correlated with clinical presentation.   Glucose, capillary     Status: Abnormal   Collection Time: 10/23/16  7:39 AM  Result Value Ref Range   Glucose-Capillary 131 (H) 65 - 99 mg/dL  Troponin I     Status: Abnormal   Collection Time: 10/23/16  8:09 AM  Result Value Ref Range   Troponin I 0.05 (HH) <0.03 ng/mL    Comment: CRITICAL VALUE NOTED.  VALUE IS CONSISTENT WITH PREVIOUSLY REPORTED AND  CALLED VALUE.  Glucose, capillary     Status: Abnormal   Collection Time: 10/23/16 11:37 AM  Result Value Ref Range   Glucose-Capillary 108 (H) 65 - 99 mg/dL    IMAGING: Dg Chest 2 View  Result Date: 10/22/2016 CLINICAL DATA:  Cough, shortness of breath for 2 weeks. Near syncope tonight. History of diabetes, CHF. EXAM: CHEST  2 VIEW COMPARISON:  None. FINDINGS: Cardiac silhouette is moderately enlarged, mediastinal silhouette is nonsuspicious. Pulmonary vascular congestion without pleural effusion or focal consolidation. No pneumothorax. Soft tissue planes and included osseous structure nonsuspicious. IMPRESSION: Stable cardiomegaly and pulmonary vascular congestion. Electronically Signed   By: Elon Alas M.D.   On: 10/22/2016 23:29    HOSPITAL DIAGNOSES: Principal Problem:   Acute exacerbation of CHF (congestive heart failure) (HCC) Active Problems:   Diabetes mellitus type 2 in obese [facility])   Essential hypertension, benign   Acute renal failure (HCC)   Ischemic cardiomyopathy   CAD   Dyspnea on exertion   Hypoxia   Elevated troponin   IMPRESSION: 1. Acute systolic congestive heart failure 2. Ischemic cardiomyopathy, EF 35-40% 3. Acute renal failure 4. Mildly elevated troponin 5. Type 2 diabetes 6. Obesity 7. Hypertension 8. Dyslipidemia  RECOMMENDATION: 1. Mr. Dase presents with several weeks of worsening dyspnea and weight gain of 8-10 pounds consistent with acute systolic congestive heart failure. Was also had recent URI symptoms which may or may not be related. BNP is elevated and troponin is slightly elevated more consistent with heart failure. There is mild elevation in d-dimer which is likely nonspecific and related to heart failure. The clinical picture is not consistent with pulmonary embolus and I feel comfortable that we could discontinue heparin for either ACS or pulmonary embolus indications. I agree with continued IV Lasix for diuresis. He is on  aspirin, coreg, isosorbide and aldactone. Will repeat echo and provide further recommendations based on that. Although he says he eats "healthy", he reported drinking gatorade (high sodium) and may have had some dietary indiscretion leading to this. He does report medicine compliance.  Thanks for the consultation. Cardiology will follow with you.  Time Spent Directly with Patient: 45 minutes  Pixie Casino, MD, [facility] Attending Cardiologist [place] 10/23/2016, 12:43 PM

## 2016-10-23 NOTE — Care Management Note (Signed)
Case Management Note  Patient Details  Name: [name] MRN: [id number] Date of Birth: [birth year]  Subjective/Objective:44 y/o m admitted w/CHF. From home. 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 Provided patient w/uninsured pcp listing-encouraged [facility]-patient states he will call on his own, provided w[employer] med list(patient can afford). Has cardiologist already. Artistinancial counselor following for resources.No further CM needs.                    Action/Plan:d/c plan home.   Expected Discharge Date:                  Expected Discharge Plan:  Home/Self Care  In-House Referral:  PCP / [facility]  Discharge planning Services  CM Consult  Post Acute Care Choice:    Choice offered to:     DME Arranged:    DME Agency:     HH Arranged:    HH Agency:     Status of Service:  In process, will continue to follow  If discussed at Long Length of Stay Meetings, dates discussed:    Additional Comments:  Lanier ClamMahabir, Gracynn Rajewski, RN 10/23/2016, 11:10 AM

## 2016-10-24 ENCOUNTER — Inpatient Hospital Stay (HOSPITAL_COMMUNITY): Payer: Self-pay

## 2016-10-24 DIAGNOSIS — I509 Heart failure, unspecified: Secondary | ICD-10-CM

## 2016-10-24 LAB — ECHOCARDIOGRAM COMPLETE
HEIGHTINCHES: 78 in
WEIGHTICAEL: 4742.4 [oz_av]

## 2016-10-24 LAB — BASIC METABOLIC PANEL
ANION GAP: 6 (ref 5–15)
BUN: 33 mg/dL — ABNORMAL HIGH (ref 6–20)
CALCIUM: 9 mg/dL (ref 8.9–10.3)
CO2: 30 mmol/L (ref 22–32)
Chloride: 101 mmol/L (ref 101–111)
Creatinine, Ser: 1.74 mg/dL — ABNORMAL HIGH (ref 0.61–1.24)
GFR, EST AFRICAN AMERICAN: 54 mL/min — AB (ref >60)
GFR, EST NON AFRICAN AMERICAN: 47 mL/min — AB (ref >60)
Glucose, Bld: 141 mg/dL — ABNORMAL HIGH (ref 65–99)
POTASSIUM: 4 mmol/L (ref 3.5–5.1)
SODIUM: 137 mmol/L (ref 135–145)

## 2016-10-24 LAB — CBC
HCT: 43.8 % (ref 39.0–52.0)
Hemoglobin: 14.2 g/dL (ref 13.0–17.0)
MCH: 26.1 pg (ref 26.0–34.0)
MCHC: 32.4 g/dL (ref 30.0–36.0)
MCV: 80.4 fL (ref 78.0–100.0)
PLATELETS: 236 10*3/uL (ref 150–400)
RBC: 5.45 MIL/uL (ref 4.22–5.81)
RDW: 14.1 % (ref 11.5–15.5)
WBC: 9.2 10*3/uL (ref 4.0–10.5)

## 2016-10-24 LAB — GLUCOSE, CAPILLARY
GLUCOSE-CAPILLARY: 109 mg/dL — AB (ref 65–99)
GLUCOSE-CAPILLARY: 140 mg/dL — AB (ref 65–99)
GLUCOSE-CAPILLARY: 143 mg/dL — AB (ref 65–99)
Glucose-Capillary: 180 mg/dL — ABNORMAL HIGH (ref 65–99)

## 2016-10-24 MED ORDER — SODIUM CHLORIDE 0.9% FLUSH
3.0000 mL | Freq: Two times a day (BID) | INTRAVENOUS | Status: DC
  Administered 2016-10-24: 3 mL via INTRAVENOUS

## 2016-10-24 MED ORDER — PERFLUTREN LIPID MICROSPHERE
INTRAVENOUS | Status: AC
Start: 2016-10-24 — End: 2016-10-24
  Filled 2016-10-24: qty 10

## 2016-10-24 MED ORDER — SODIUM CHLORIDE 0.9% FLUSH: 3.0000 mL | INTRAVENOUS | Status: DC | PRN

## 2016-10-24 MED ORDER — PERFLUTREN LIPID MICROSPHERE
1.0000 mL | INTRAVENOUS | Status: AC | PRN
  Administered 2016-10-24: 3 mL via INTRAVENOUS
  Filled 2016-10-24: qty 10

## 2016-10-24 MED ORDER — SODIUM CHLORIDE 0.9 % IV SOLN: 250.0000 mL | INTRAVENOUS | Status: DC | PRN

## 2016-10-24 MED ORDER — FUROSEMIDE 10 MG/ML IJ SOLN
40.0000 mg | Freq: Every day | INTRAMUSCULAR | Status: DC
  Filled 2016-10-24: qty 4

## 2016-10-24 MED ORDER — SODIUM CHLORIDE 0.9 % IV SOLN
INTRAVENOUS | Status: DC
  Administered 2016-10-25: 06:00:00 via INTRAVENOUS
  Administered 2016-10-25: 30 mL/h via INTRAVENOUS

## 2016-10-24 MED ORDER — ASPIRIN 81 MG PO CHEW
81.0000 mg | CHEWABLE_TABLET | ORAL | Status: AC
  Administered 2016-10-25: 81 mg via ORAL
  Filled 2016-10-24: qty 1

## 2016-10-24 NOTE — Progress Notes (Signed)
DAILY PROGRESS NOTE  Subjective:  Feels better. Can lay flat. Diuresed about 4L negative so far - Creatinine rose overnight from 1.55-1.74. Troponin mildly elevated and flat at 0.05. Echo performed this morning - LVEF is now severely reduced at approximately 10% with severe global hypokinesis, restrictive filling pressures, severe MR and mildly reduced RV function.   Objective:  Temp:  [97.3 F (36.3 C)-97.8 F (36.6 C)] 97.8 F (36.6 C) (10/26 0609) Pulse Rate:  [66-80] 66 (10/26 0609) Resp:  [14-19] 16 (10/26 0609) BP: (106-131)/(67-97) 119/86 (10/26 0609) SpO2:  [98 %-100 %] 100 % (10/26 0609) Weight:  [296 lb 6.4 oz (134.4 kg)] 296 lb 6.4 oz (134.4 kg) (10/26 0609) Weight change: 3 lb 6.4 oz (1.542 kg)  Intake/Output from previous day: 10/25 0701 - 10/26 0700 In: 480 [P.O.:480] Out: 3750 [Urine:3750]  Intake/Output from this shift: Total I/O In: 360 [P.O.:360] Out: 975 [Urine:975]  Medications: No current facility-administered medications on file prior to encounter.    Current Outpatient Prescriptions on File Prior to Encounter  Medication Sig Dispense Refill  . aspirin EC 325 MG tablet Take 1 tablet (325 mg total) by mouth daily. 30 tablet 0  . carvedilol (COREG) 25 MG tablet TAKE ONE TABLET BY MOUTH TWICE DAILY 180 tablet 3  . furosemide (LASIX) 20 MG tablet Take 1 tablet (20 mg total) by mouth daily. KEEP OV. 30 tablet 0  . glipiZIDE (GLUCOTROL) 10 MG tablet Take 1 tablet (10 mg total) by mouth 2 (two) times daily before a meal. 180 tablet 3  . isosorbide mononitrate (IMDUR) 30 MG 24 hr tablet Take 1 tablet (30 mg total) by mouth daily. 90 tablet 3  . lisinopril (PRINIVIL,ZESTRIL) 20 MG tablet Take 20 mg by mouth 2 (two) times daily.    . nitroGLYCERIN (NITROSTAT) 0.4 MG SL tablet Place 1 tablet (0.4 mg total) under the tongue every 5 (five) minutes x 3 doses as needed for chest pain. 20 tablet 1  . omeprazole (PRILOSEC) 20 MG capsule Take 20 mg by mouth daily.      [name] spironolactone (ALDACTONE) 50 MG tablet TAKE ONE TABLET BY MOUTH ONCE DAILY 30 tablet 5  . amLODipine (NORVASC) 10 MG tablet Take 1 tablet (10 mg total) by mouth daily. (Patient not taking: Reported on 10/23/2016) 90 tablet 3  . spironolactone (ALDACTONE) 25 MG tablet Take 1 tablet (25 mg total) by mouth daily. (Patient not taking: Reported on 10/23/2016) 30 tablet 11    Physical Exam: General appearance: alert and no distress Neck: JVD - 3 cm above sternal notch and no carotid bruit Lungs: clear to auscultation bilaterally Heart: regular rate and rhythm Abdomen: soft, non-tender; bowel sounds normal; no masses,  no organomegaly and obese Extremities: extremities normal, atraumatic, no cyanosis or edema Pulses: 2+ and symmetric Skin: Skin color, texture, turgor normal. No rashes or lesions Neurologic: Grossly normal Psych: Pleasant  Lab Results: Results for orders placed or performed during the hospital encounter of 10/22/16 (from the past 48 hour(s))  Lipase, blood     Status: None   Collection Time: 10/22/16 10:46 PM  Result Value Ref Range   Lipase 34 11 - 51 U/L  Comprehensive metabolic panel     Status: Abnormal   Collection Time: 10/22/16 10:46 PM  Result Value Ref Range   Sodium 138 135 - 145 mmol/L   Potassium 4.4 3.5 - 5.1 mmol/L   Chloride 107 101 - 111 mmol/L   CO2 21 (L) 22 - 32 mmol/L  Glucose, Bld 135 (H) 65 - 99 mg/dL   BUN 35 (H) 6 - 20 mg/dL   Creatinine, Ser 1.70 (H) 0.61 - 1.24 mg/dL   Calcium 9.0 8.9 - 10.3 mg/dL   Total Protein 6.9 6.5 - 8.1 g/dL   Albumin 3.9 3.5 - 5.0 g/dL   AST 30 15 - 41 U/L   ALT 35 17 - 63 U/L   Alkaline Phosphatase 38 38 - 126 U/L   Total Bilirubin 1.1 0.3 - 1.2 mg/dL   GFR calc non Af Amer 48 (L) >60 mL/min   GFR calc Af Amer 56 (L) >60 mL/min    Comment: (NOTE) The eGFR has been calculated using the CKD EPI equation. This calculation has not been validated in all clinical situations. eGFR's persistently <60 mL/min  signify possible Chronic Kidney Disease.    Anion gap 10 5 - 15  CBC     Status: Abnormal   Collection Time: 10/22/16 10:46 PM  Result Value Ref Range   WBC 10.7 (H) 4.0 - 10.5 K/uL   RBC 5.30 4.22 - 5.81 MIL/uL   Hemoglobin 14.1 13.0 - 17.0 g/dL   HCT 41.2 39.0 - 52.0 %   MCV 77.7 (L) 78.0 - 100.0 fL   MCH 26.6 26.0 - 34.0 pg   MCHC 34.2 30.0 - 36.0 g/dL   RDW 13.9 11.5 - 15.5 %   Platelets 207 150 - 400 K/uL  Brain natriuretic peptide     Status: Abnormal   Collection Time: 10/22/16 10:46 PM  Result Value Ref Range   B Natriuretic Peptide 755.4 (H) 0.0 - 100.0 pg/mL  Troponin I     Status: Abnormal   Collection Time: 10/22/16 10:46 PM  Result Value Ref Range   Troponin I 0.06 (HH) <0.03 ng/mL    Comment: CRITICAL RESULT CALLED TO, READ BACK BY AND VERIFIED WITH: ABBIE LEDWELL,RN [id number] @ 2343 BY J SCOTTON   Urinalysis, Routine w reflex microscopic     Status: Abnormal   Collection Time: 10/22/16 11:27 PM  Result Value Ref Range   Color, Urine AMBER (A) YELLOW    Comment: BIOCHEMICALS MAY BE AFFECTED BY COLOR   APPearance CLEAR CLEAR   Specific Gravity, Urine 1.037 (H) 1.005 - 1.030   pH 6.0 5.0 - 8.0   Glucose, UA NEGATIVE NEGATIVE mg/dL   Hgb urine dipstick NEGATIVE NEGATIVE   Bilirubin Urine SMALL (A) NEGATIVE   Ketones, ur NEGATIVE NEGATIVE mg/dL   Protein, ur >300 (A) NEGATIVE mg/dL   Nitrite NEGATIVE NEGATIVE   Leukocytes, UA NEGATIVE NEGATIVE  Urine microscopic-add on     Status: Abnormal   Collection Time: 10/22/16 11:27 PM  Result Value Ref Range   Squamous Epithelial / LPF 0-5 (A) NONE SEEN   WBC, UA 0-5 0 - 5 WBC/hpf   RBC / HPF 0-5 0 - 5 RBC/hpf   Bacteria, UA FEW (A) NONE SEEN   Casts HYALINE CASTS (A) NEGATIVE    Comment: GRANULAR CAST   Urine-Other MUCOUS PRESENT   Troponin I     Status: Abnormal   Collection Time: 10/23/16  1:17 AM  Result Value Ref Range   Troponin I 0.06 (HH) <0.03 ng/mL    Comment: CRITICAL VALUE NOTED.  VALUE IS CONSISTENT  WITH PREVIOUSLY REPORTED AND CALLED VALUE.  APTT     Status: None   Collection Time: 10/23/16  5:05 AM  Result Value Ref Range   aPTT 27 24 - 36 seconds  Protime-INR  Status: Abnormal   Collection Time: 10/23/16  5:05 AM  Result Value Ref Range   Prothrombin Time 15.9 (H) 11.4 - 15.2 seconds   INR 1.26   CBC     Status: None   Collection Time: 10/23/16  5:05 AM  Result Value Ref Range   WBC 8.1 4.0 - 10.5 K/uL   RBC 4.98 4.22 - 5.81 MIL/uL   Hemoglobin 13.0 13.0 - 17.0 g/dL   HCT 40.2 39.0 - 52.0 %   MCV 80.7 78.0 - 100.0 fL   MCH 26.1 26.0 - 34.0 pg   MCHC 32.3 30.0 - 36.0 g/dL   RDW 14.2 11.5 - 15.5 %   Platelets 186 150 - 400 K/uL  D-dimer, quantitative     Status: Abnormal   Collection Time: 10/23/16  5:05 AM  Result Value Ref Range   D-Dimer, Quant 0.52 (H) 0.00 - 0.50 ug/mL-FEU    Comment: (NOTE) At the manufacturer cut-off of 0.50 ug/mL FEU, this assay has been documented to exclude PE with a sensitivity and negative predictive value of 97 to 99%.  At this time, this assay has not been approved by the FDA to exclude DVT/VTE. Results should be correlated with clinical presentation.   Glucose, capillary     Status: Abnormal   Collection Time: 10/23/16  7:39 AM  Result Value Ref Range   Glucose-Capillary 131 (H) 65 - 99 mg/dL  Troponin I     Status: Abnormal   Collection Time: 10/23/16  8:09 AM  Result Value Ref Range   Troponin I 0.05 (HH) <0.03 ng/mL    Comment: CRITICAL VALUE NOTED.  VALUE IS CONSISTENT WITH PREVIOUSLY REPORTED AND CALLED VALUE.  Glucose, capillary     Status: Abnormal   Collection Time: 10/23/16 11:37 AM  Result Value Ref Range   Glucose-Capillary 108 (H) 65 - 99 mg/dL  Heparin level (unfractionated)     Status: None   Collection Time: 10/23/16 12:49 PM  Result Value Ref Range   Heparin Unfractionated 0.54 0.30 - 0.70 IU/mL    Comment:        IF HEPARIN RESULTS ARE BELOW EXPECTED VALUES, AND PATIENT DOSAGE HAS BEEN CONFIRMED, SUGGEST  FOLLOW UP TESTING OF ANTITHROMBIN III LEVELS.   Troponin I     Status: Abnormal   Collection Time: 10/23/16 12:49 PM  Result Value Ref Range   Troponin I 0.05 (HH) <0.03 ng/mL    Comment: CRITICAL VALUE NOTED.  VALUE IS CONSISTENT WITH PREVIOUSLY REPORTED AND CALLED VALUE.  Basic metabolic panel     Status: Abnormal   Collection Time: 10/23/16 12:49 PM  Result Value Ref Range   Sodium 138 135 - 145 mmol/L   Potassium 4.0 3.5 - 5.1 mmol/L   Chloride 105 101 - 111 mmol/L   CO2 24 22 - 32 mmol/L   Glucose, Bld 113 (H) 65 - 99 mg/dL   BUN 35 (H) 6 - 20 mg/dL   Creatinine, Ser 1.55 (H) 0.61 - 1.24 mg/dL   Calcium 8.9 8.9 - 10.3 mg/dL   GFR calc non Af Amer 54 (L) >60 mL/min   GFR calc Af Amer >60 >60 mL/min    Comment: (NOTE) The eGFR has been calculated using the CKD EPI equation. This calculation has not been validated in all clinical situations. eGFR's persistently <60 mL/min signify possible Chronic Kidney Disease.    Anion gap 9 5 - 15  Glucose, capillary     Status: Abnormal   Collection Time: 10/23/16  5:11  PM  Result Value Ref Range   Glucose-Capillary 138 (H) 65 - 99 mg/dL  Glucose, capillary     Status: Abnormal   Collection Time: 10/23/16  7:58 PM  Result Value Ref Range   Glucose-Capillary 144 (H) 65 - 99 mg/dL  Troponin I     Status: Abnormal   Collection Time: 10/23/16  8:09 PM  Result Value Ref Range   Troponin I 0.05 (HH) <0.03 ng/mL    Comment: CRITICAL VALUE NOTED.  VALUE IS CONSISTENT WITH PREVIOUSLY REPORTED AND CALLED VALUE.  CBC     Status: None   Collection Time: 10/24/16  5:34 AM  Result Value Ref Range   WBC 9.2 4.0 - 10.5 K/uL   RBC 5.45 4.22 - 5.81 MIL/uL   Hemoglobin 14.2 13.0 - 17.0 g/dL   HCT 43.8 39.0 - 52.0 %   MCV 80.4 78.0 - 100.0 fL   MCH 26.1 26.0 - 34.0 pg   MCHC 32.4 30.0 - 36.0 g/dL   RDW 14.1 11.5 - 15.5 %   Platelets 236 150 - 400 K/uL  Basic metabolic panel     Status: Abnormal   Collection Time: 10/24/16  5:34 AM  Result  Value Ref Range   Sodium 137 135 - 145 mmol/L   Potassium 4.0 3.5 - 5.1 mmol/L   Chloride 101 101 - 111 mmol/L   CO2 30 22 - 32 mmol/L   Glucose, Bld 141 (H) 65 - 99 mg/dL   BUN 33 (H) 6 - 20 mg/dL   Creatinine, Ser 1.74 (H) 0.61 - 1.24 mg/dL   Calcium 9.0 8.9 - 10.3 mg/dL   GFR calc non Af Amer 47 (L) >60 mL/min   GFR calc Af Amer 54 (L) >60 mL/min    Comment: (NOTE) The eGFR has been calculated using the CKD EPI equation. This calculation has not been validated in all clinical situations. eGFR's persistently <60 mL/min signify possible Chronic Kidney Disease.    Anion gap 6 5 - 15  Glucose, capillary     Status: Abnormal   Collection Time: 10/24/16  7:39 AM  Result Value Ref Range   Glucose-Capillary 109 (H) 65 - 99 mg/dL   Comment 1 Notify RN    Comment 2 Document in Chart   Glucose, capillary     Status: Abnormal   Collection Time: 10/24/16 12:08 PM  Result Value Ref Range   Glucose-Capillary 140 (H) 65 - 99 mg/dL   Comment 1 Notify RN    Comment 2 Document in Chart     Imaging: Dg Chest 2 View  Result Date: 10/22/2016 CLINICAL DATA:  Cough, shortness of breath for 2 weeks. Near syncope tonight. History of diabetes, CHF. EXAM: CHEST  2 VIEW COMPARISON:  None. FINDINGS: Cardiac silhouette is moderately enlarged, mediastinal silhouette is nonsuspicious. Pulmonary vascular congestion without pleural effusion or focal consolidation. No pneumothorax. Soft tissue planes and included osseous structure nonsuspicious. IMPRESSION: Stable cardiomegaly and pulmonary vascular congestion. Electronically Signed   By: Elon Alas M.D.   On: 10/22/2016 23:29    Assessment:  1. Principal Problem: 2.   Acute exacerbation of CHF (congestive heart failure) ([place]) 3. Active Problems: 4.   Diabetes mellitus type 2 in obese ([place]) 5.   Essential hypertension, benign 6.   Acute renal failure ([name]) 7.   Ischemic cardiomyopathy 8.   CAD 9.   Dyspnea on exertion 10.   Hypoxia 11.    Elevated troponin 12.   Plan:  1. [name] feels  much better after almost 4L diuresis overnight- creatinine has acutely risen. Echo today shows severe LV dysfunction with EF 10%. Given history of CAD in the past, would recommend [facility] tomorrow. Decrease lasix to 40 mg IV daily. Keep NPO p MN - gentle hydration tomorrow am. Cath scheduled with Dr. Ellyn Hack at 10:30 am.  Time Spent Directly with Patient:  15 minutes   Length of Stay:  LOS: 1 day   Pixie Casino, MD, [facility] Attending Cardiologist [place] 10/24/2016, 1:12 PM

## 2016-10-24 NOTE — Progress Notes (Addendum)
PROGRESS NOTE    [name]  ZOX:[id number] DOB: [birth year] DOA: 10/22/2016 PCP: No PCP Per Patient    Brief Narrative:  [name] is a 45 y.o. male with medical history significant of HTN, systolic CHF last EF 35-40% in 12/2014, ischemic cardiomyopathy, MI, CAD, and HLD; who presents with complaints of intermittent shortness of breath over the last 3-4 weeks.   Assessment & Plan:   Principal Problem:   Acute systolic congestive heart failure (HCC) Active Problems:   Diabetes mellitus type 2 in obese [facility])   Essential hypertension, benign   Acute renal failure (HCC)   Ischemic cardiomyopathy   CAD   Dyspnea on exertion   Hypoxia   Acute exacerbation of CHF (congestive heart failure) (HCC)   Elevated troponin   Acute systolic CHF exacerbation:  Admitted to telemetry, starte don IV lasix,, daily weights, strict intake and output. Echocardiogram reviewed.  Cardiology consulted and recommendations.  Plan for cardiac cath in am.    Diabetes Mellitus: CBG (last 3)   Recent Labs  10/24/16 0739 10/24/16 1208 10/24/16 1707  GLUCAP 109* 140* 143*    Resume SSI.    Elevated troponins no chest pain.    Hypertension: controlled.   Mild acute kidney injury: Decreased the dose of lasix to daily.         DVT prophylaxis: (Lovenox) Code Status: full code.  Family Communication: none at bedside.  Disposition Plan: pending further evaluation.    Consultants:   Cardiology.   Procedures: none.    Antimicrobials: none.    Subjective: No new complaints.   Objective: Vitals:   10/23/16 2022 10/23/16 2238 10/24/16 0609 10/24/16 1449  BP: 115/90 (!) 131/97 119/86 120/86  Pulse:  80 66 69  Resp: 14  16 16   Temp: 97.3 F (36.3 C)  97.8 F (36.6 C) 97.7 F (36.5 C)  TempSrc: Oral  Axillary Oral  SpO2: 98%  100% 98%  Weight:   134.4 kg (296 lb 6.4 oz)   Height:        Intake/Output Summary (Last 24 hours) at 10/24/16 1711 Last data filed  at 10/24/16 1008  Gross per 24 hour  Intake              360 ml  Output             2675 ml  Net            -2315 ml   Filed Weights   10/23/16 0537 10/23/16 0625 10/24/16 0609  Weight: (!) 139.7 kg (308 lb) (!) 139.1 kg (306 lb 9.6 oz) 134.4 kg (296 lb 6.4 oz)    Examination:  General exam: Appears calm and comfortable  Respiratory system: Clear to auscultation. Respiratory effort normal. Cardiovascular system: S1 & S2 heard, RRR. No JVD, murmurs, rubs, gallops or clicks. No pedal edema. Gastrointestinal system: Abdomen is nondistended, soft and nontender. No organomegaly or masses felt. Normal bowel sounds heard. Central nervous system: Alert and oriented. No focal neurological deficits. Extremities: Symmetric 5 x 5 power. Skin: No rashes, lesions or ulcers Psychiatry: Judgement and insight appear normal. Mood & affect appropriate.     Data Reviewed: I have personally reviewed following labs and imaging studies  CBC:  Recent Labs Lab 10/22/16 2246 10/23/16 0505 10/24/16 0534  WBC 10.7* 8.1 9.2  HGB 14.1 13.0 14.2  HCT 41.2 40.2 43.8  MCV 77.7* 80.7 80.4  PLT 207 186 236   Basic Metabolic Panel:  Recent Labs Lab 10/22/16 2246  10/23/16 1249 10/24/16 0534  NA 138 138 137  K 4.4 4.0 4.0  CL 107 105 101  CO2 21* 24 30  GLUCOSE 135* 113* 141*  BUN 35* 35* 33*  CREATININE 1.70* 1.55* 1.74*  CALCIUM 9.0 8.9 9.0   GFR: Estimated Creatinine Clearance (by C-G formula based on SCr of 1.74 mg/dL (H)) Male: 78.270.4 mL/min Male: 85 mL/min Liver Function Tests:  Recent Labs Lab 10/22/16 2246  AST 30  ALT 35  ALKPHOS 38  BILITOT 1.1  PROT 6.9  ALBUMIN 3.9    Recent Labs Lab 10/22/16 2246  LIPASE 34   No results for input(s): AMMONIA in the last 168 hours. Coagulation Profile:  Recent Labs Lab 10/23/16 0505  INR 1.26   Cardiac Enzymes:  Recent Labs Lab 10/22/16 2246 10/23/16 0117 10/23/16 0809 10/23/16 1249 10/23/16 2009  TROPONINI 0.06*  0.06* 0.05* 0.05* 0.05*   BNP (last 3 results) No results for input(s): PROBNP in the last 8760 hours. HbA1C: No results for input(s): HGBA1C in the last 72 hours. CBG:  Recent Labs Lab 10/23/16 1137 10/23/16 1711 10/23/16 1958 10/24/16 0739 10/24/16 1208  GLUCAP 108* 138* 144* 109* 140*   Lipid Profile: No results for input(s): CHOL, HDL, LDLCALC, TRIG, CHOLHDL, LDLDIRECT in the last 72 hours. Thyroid Function Tests: No results for input(s): TSH, T4TOTAL, FREET4, T3FREE, THYROIDAB in the last 72 hours. Anemia Panel: No results for input(s): VITAMINB12, FOLATE, FERRITIN, TIBC, IRON, RETICCTPCT in the last 72 hours. Sepsis Labs: No results for input(s): PROCALCITON, LATICACIDVEN in the last 168 hours.  No results found for this or any previous visit (from the past 240 hour(s)).       Radiology Studies: Dg Chest 2 View  Result Date: 10/22/2016 CLINICAL DATA:  Cough, shortness of breath for 2 weeks. Near syncope tonight. History of diabetes, CHF. EXAM: CHEST  2 VIEW COMPARISON:  None. FINDINGS: Cardiac silhouette is moderately enlarged, mediastinal silhouette is nonsuspicious. Pulmonary vascular congestion without pleural effusion or focal consolidation. No pneumothorax. Soft tissue planes and included osseous structure nonsuspicious. IMPRESSION: Stable cardiomegaly and pulmonary vascular congestion. Electronically Signed   By: Awilda Metroourtnay  Bloomer M.D.   On: 10/22/2016 23:29        Scheduled Meds: . [START ON 10/25/2016] aspirin  81 mg Oral Pre-Cath  . aspirin EC  325 mg Oral Daily  . carvedilol  25 mg Oral BID  . [START ON 10/25/2016] furosemide  40 mg Intravenous Daily  . guaiFENesin  600 mg Oral BID  . insulin aspart  0-9 Units Subcutaneous TID WC  . isosorbide mononitrate  30 mg Oral Daily  . pantoprazole  40 mg Oral Daily  . sodium chloride flush  3 mL Intravenous Q12H  . sodium chloride flush  3 mL Intravenous Q12H  . spironolactone  50 mg Oral Daily    Continuous Infusions: . [START ON 10/25/2016] sodium chloride       LOS: 1 day    Time spent: 35 minutes    Skyelar Swigart, MD [facility] Pager [phone]  If 7PM-7AM, please contact night-coverage [email or web address] Password [email or web address] 10/24/2016, 5:11 PM

## 2016-10-24 NOTE — Progress Notes (Signed)
  Echocardiogram 2D Echocardiogram has been performed.  [name], [name] 10/24/2016, 12:58 PM

## 2016-10-25 ENCOUNTER — Encounter (HOSPITAL_COMMUNITY)
Admission: EM | Disposition: A | Discharge status: Home / Self Care | Payer: Self-pay | Source: Home / Self Care | Attending: Internal Medicine

## 2016-10-25 DIAGNOSIS — I25110 Atherosclerotic heart disease of native coronary artery with unstable angina pectoris: Secondary | ICD-10-CM

## 2016-10-25 DIAGNOSIS — I5043 Acute on chronic combined systolic (congestive) and diastolic (congestive) heart failure: Secondary | ICD-10-CM

## 2016-10-25 HISTORY — PX: CARDIAC CATHETERIZATION: SHX172

## 2016-10-25 LAB — GLUCOSE, CAPILLARY
GLUCOSE-CAPILLARY: 133 mg/dL — AB (ref 65–99)
Glucose-Capillary: 112 mg/dL — ABNORMAL HIGH (ref 65–99)

## 2016-10-25 LAB — BASIC METABOLIC PANEL
Anion gap: 11 (ref 5–15)
BUN: 36 mg/dL — AB (ref 6–20)
CHLORIDE: 101 mmol/L (ref 101–111)
CO2: 27 mmol/L (ref 22–32)
Calcium: 9.2 mg/dL (ref 8.9–10.3)
Creatinine, Ser: 1.67 mg/dL — ABNORMAL HIGH (ref 0.61–1.24)
GFR calc Af Amer: 57 mL/min — ABNORMAL LOW (ref >60)
GFR calc non Af Amer: 49 mL/min — ABNORMAL LOW (ref >60)
Glucose, Bld: 148 mg/dL — ABNORMAL HIGH (ref 65–99)
POTASSIUM: 4.2 mmol/L (ref 3.5–5.1)
SODIUM: 139 mmol/L (ref 135–145)

## 2016-10-25 LAB — POCT I-STAT 3, ART BLOOD GAS (G3+)
Acid-base deficit: 1 mmol/L (ref 0.0–2.0)
Bicarbonate: 23.1 mmol/L (ref 20.0–28.0)
O2 SAT: 97 %
PCO2 ART: 35.8 mmHg (ref 32.0–48.0)
PH ART: 7.418 (ref 7.350–7.450)
TCO2: 24 mmol/L (ref 0–100)
pO2, Arterial: 89 mmHg (ref 83.0–108.0)

## 2016-10-25 LAB — POCT I-STAT 3, VENOUS BLOOD GAS (G3P V)
ACID-BASE DEFICIT: 1 mmol/L (ref 0.0–2.0)
BICARBONATE: 25 mmol/L (ref 20.0–28.0)
O2 Saturation: 51 %
PH VEN: 7.361 (ref 7.250–7.430)
TCO2: 26 mmol/L (ref 0–100)
pCO2, Ven: 44.2 mmHg (ref 44.0–60.0)
pO2, Ven: 29 mmHg — CL (ref 32.0–45.0)

## 2016-10-25 LAB — POCT ACTIVATED CLOTTING TIME: ACTIVATED CLOTTING TIME: 373 s

## 2016-10-25 LAB — MRSA PCR SCREENING: MRSA BY PCR: NEGATIVE

## 2016-10-25 SURGERY — RIGHT/LEFT HEART CATH AND CORONARY ANGIOGRAPHY

## 2016-10-25 MED ORDER — FENTANYL CITRATE (PF) 100 MCG/2ML IJ SOLN
INTRAMUSCULAR | Status: AC
  Filled 2016-10-25: qty 2

## 2016-10-25 MED ORDER — BIVALIRUDIN 250 MG IV SOLR
INTRAVENOUS | Status: AC
  Filled 2016-10-25: qty 250

## 2016-10-25 MED ORDER — TICAGRELOR 90 MG PO TABS
ORAL_TABLET | ORAL | Status: AC
  Filled 2016-10-25: qty 2

## 2016-10-25 MED ORDER — LIDOCAINE HCL (PF) 1 % IJ SOLN
INTRAMUSCULAR | Status: DC | PRN
  Administered 2016-10-25 (×2): 2 mL via INTRADERMAL

## 2016-10-25 MED ORDER — ASPIRIN 81 MG PO CHEW
81.0000 mg | CHEWABLE_TABLET | Freq: Every day | ORAL | Status: DC
  Administered 2016-10-26 – 2016-10-30 (×5): 81 mg via ORAL
  Filled 2016-10-25 (×5): qty 1

## 2016-10-25 MED ORDER — FENTANYL CITRATE (PF) 100 MCG/2ML IJ SOLN
INTRAMUSCULAR | Status: DC | PRN
  Administered 2016-10-25: 50 ug via INTRAVENOUS
  Administered 2016-10-25: 25 ug via INTRAVENOUS

## 2016-10-25 MED ORDER — SODIUM CHLORIDE 0.9 % IV SOLN
INTRAVENOUS | Status: DC | PRN
  Administered 2016-10-25 (×2): 1.75 mg/kg/h via INTRAVENOUS

## 2016-10-25 MED ORDER — HEPARIN SODIUM (PORCINE) 1000 UNIT/ML IJ SOLN
INTRAMUSCULAR | Status: DC | PRN
  Administered 2016-10-25: 7000 [IU] via INTRAVENOUS

## 2016-10-25 MED ORDER — IOPAMIDOL (ISOVUE-370) INJECTION 76%
INTRAVENOUS | Status: AC
  Filled 2016-10-25: qty 100

## 2016-10-25 MED ORDER — LIDOCAINE HCL (PF) 1 % IJ SOLN
INTRAMUSCULAR | Status: AC
  Filled 2016-10-25: qty 30

## 2016-10-25 MED ORDER — HEPARIN SODIUM (PORCINE) 5000 UNIT/ML IJ SOLN
5000.0000 [IU] | Freq: Three times a day (TID) | INTRAMUSCULAR | Status: DC
  Administered 2016-10-26 – 2016-10-28 (×7): 5000 [IU] via SUBCUTANEOUS
  Filled 2016-10-25 (×7): qty 1

## 2016-10-25 MED ORDER — NITROGLYCERIN 1 MG/10 ML FOR IR/CATH LAB
INTRA_ARTERIAL | Status: DC | PRN
  Administered 2016-10-25 (×2): 200 ug via INTRACORONARY

## 2016-10-25 MED ORDER — MIDAZOLAM HCL 2 MG/2ML IJ SOLN
INTRAMUSCULAR | Status: AC
  Filled 2016-10-25: qty 2

## 2016-10-25 MED ORDER — HEPARIN SODIUM (PORCINE) 1000 UNIT/ML IJ SOLN
INTRAMUSCULAR | Status: AC
  Filled 2016-10-25: qty 1

## 2016-10-25 MED ORDER — SODIUM CHLORIDE 0.9 % IV SOLN: 250.0000 mL | INTRAVENOUS | Status: DC | PRN

## 2016-10-25 MED ORDER — FUROSEMIDE 10 MG/ML IJ SOLN
40.0000 mg | Freq: Two times a day (BID) | INTRAMUSCULAR | Status: DC
  Administered 2016-10-25 – 2016-10-26 (×3): 40 mg via INTRAVENOUS
  Filled 2016-10-25 (×3): qty 4

## 2016-10-25 MED ORDER — BIVALIRUDIN BOLUS VIA INFUSION - CUPID
INTRAVENOUS | Status: DC | PRN
Start: 2016-10-25 — End: 2016-10-25
  Administered 2016-10-25: 100.8 mg via INTRAVENOUS

## 2016-10-25 MED ORDER — HEPARIN (PORCINE) IN NACL 2-0.9 UNIT/ML-% IJ SOLN
INTRAMUSCULAR | Status: DC | PRN
  Administered 2016-10-25: 10 mL via INTRA_ARTERIAL

## 2016-10-25 MED ORDER — TICAGRELOR 90 MG PO TABS
90.0000 mg | ORAL_TABLET | Freq: Two times a day (BID) | ORAL | Status: DC
  Administered 2016-10-25 – 2016-10-30 (×10): 90 mg via ORAL
  Filled 2016-10-25 (×10): qty 1

## 2016-10-25 MED ORDER — MIDAZOLAM HCL 2 MG/2ML IJ SOLN
INTRAMUSCULAR | Status: DC | PRN
  Administered 2016-10-25: 2 mg via INTRAVENOUS
  Administered 2016-10-25: 1 mg via INTRAVENOUS

## 2016-10-25 MED ORDER — HEPARIN (PORCINE) IN NACL 2-0.9 UNIT/ML-% IJ SOLN
INTRAMUSCULAR | Status: DC | PRN
  Administered 2016-10-25: 1000 mL

## 2016-10-25 MED ORDER — HEPARIN (PORCINE) IN NACL 2-0.9 UNIT/ML-% IJ SOLN
INTRAMUSCULAR | Status: AC
  Filled 2016-10-25: qty 1000

## 2016-10-25 MED ORDER — IOPAMIDOL (ISOVUE-370) INJECTION 76%
INTRAVENOUS | Status: DC | PRN
  Administered 2016-10-25: 225 mL via INTRAVENOUS

## 2016-10-25 MED ORDER — NITROGLYCERIN 1 MG/10 ML FOR IR/CATH LAB
INTRA_ARTERIAL | Status: AC
Start: 2016-10-25 — End: 2016-10-25
  Filled 2016-10-25: qty 10

## 2016-10-25 MED ORDER — TICAGRELOR 90 MG PO TABS
ORAL_TABLET | ORAL | Status: DC | PRN
  Administered 2016-10-25: 180 mg via ORAL

## 2016-10-25 MED ORDER — SODIUM CHLORIDE 0.9 % IV SOLN
INTRAVENOUS | Status: AC
  Administered 2016-10-25: 125 mL/h via INTRAVENOUS

## 2016-10-25 MED ORDER — SODIUM CHLORIDE 0.9% FLUSH
3.0000 mL | Freq: Two times a day (BID) | INTRAVENOUS | Status: DC
  Administered 2016-10-25: 3 mL via INTRAVENOUS

## 2016-10-25 MED ORDER — VERAPAMIL HCL 2.5 MG/ML IV SOLN
INTRAVENOUS | Status: AC
  Filled 2016-10-25: qty 2

## 2016-10-25 MED ORDER — SODIUM CHLORIDE 0.9% FLUSH: 3.0000 mL | INTRAVENOUS | Status: DC | PRN

## 2016-10-25 SURGICAL SUPPLY — 21 items
BALLN EUPHORA RX 2.0X20 (BALLOONS) ×3
BALLN EUPHORA RX 2.5X12 (BALLOONS) ×3
BALLN ~~LOC~~ TREK RX 3.25X20 (BALLOONS) ×3 IMPLANT
BALLOON EUPHORA RX 2.0X20 (BALLOONS) ×1 IMPLANT
BALLOON EUPHORA RX 2.5X12 (BALLOONS) ×1 IMPLANT
CATH OPTITORQUE TIG 4.5 5F (CATHETERS) ×3 IMPLANT
CATH SWAN GANZ 7F STRAIGHT (CATHETERS) ×3 IMPLANT
CATH VISTA GUIDE 6FR XBLAD3.5 (CATHETERS) ×3 IMPLANT
DEVICE RAD COMP TR BAND LRG (VASCULAR PRODUCTS) ×3 IMPLANT
GLIDESHEATH SLEND A-KIT 6F 22G (SHEATH) ×3 IMPLANT
KIT ENCORE 26 ADVANTAGE (KITS) ×3 IMPLANT
KIT HEART LEFT (KITS) ×3 IMPLANT
PACK CARDIAC CATHETERIZATION (CUSTOM PROCEDURE TRAY) ×3 IMPLANT
SHEATH PINNACLE 7F 10CM (SHEATH) ×3 IMPLANT
STENT PROMUS PREM MR 2.5X38 (Permanent Stent) ×3 IMPLANT
STENT PROMUS PREM MR 3.0X20 (Permanent Stent) ×3 IMPLANT
TRANSDUCER W/STOPCOCK (MISCELLANEOUS) ×3 IMPLANT
TUBING CIL FLEX 10 FLL-RA (TUBING) ×3 IMPLANT
WIRE LUGE 182CM (WIRE) ×3 IMPLANT
WIRE RUNTHROUGH .014X180CM (WIRE) ×3 IMPLANT
WIRE SAFE-T 1.5MM-J .035X260CM (WIRE) ×3 IMPLANT

## 2016-10-25 NOTE — Care Management Note (Signed)
Case Management Note  Patient Details  Name: [name] MRN: [id number] Date of Birth: [birth year]  Subjective/Objective:    Adm w mi                Action/Plan:lives at home   Expected Discharge Date:                  Expected Discharge Plan:  Home/Self Care  In-House Referral:  PCP / [facility]  Discharge planning Services  CM Consult, Medication Assistance  Post Acute Care Choice:    Choice offered to:     DME Arranged:    DME Agency:     HH Arranged:    HH Agency:     Status of Service:  In process, will continue to follow  If discussed at Long Length of Stay Meetings, dates discussed:    Additional Comments:met w pt and his dad. No ins. Gave pt inform on [facility] [facility]. Gave pt 30day free brilinta card. Enc pt to call 800 number to see if can get few more months and pt assist form on shadow chart for md to sign then explained he would need to send in w proof of income.   Lacretia Leigh, RN 10/25/2016, 3:57 PM

## 2016-10-25 NOTE — Interval H&P Note (Signed)
History and Physical Interval Note:  10/25/2016 12:37 PM  [name]  has presented today for surgery, with the diagnosis of Cardiomyopathy with known CAD-PCI of LAD The various methods of treatment have been discussed with the patient and family. After consideration of risks, benefits and other options for treatment, the patient has consented to  Procedure(s): Right/Left Heart Cath and Coronary Angiography (N/A) With Possible Percutaneous Coronary Intervention as a surgical intervention .  The patient's history has been reviewed, patient examined, no change in status, stable for surgery.  I have reviewed the patient's chart and labs.  Questions were answered to the patient's satisfaction.    Cath Lab Visit (complete for each Cath Lab visit)  Clinical Evaluation Leading to the Procedure:   ACS: No. - Acute Combined CHF   Non-ACS:    Anginal Classification: CCS III CHF Sx  Anti-ischemic medical therapy: Maximal Therapy (2 or more classes of medications)  Non-Invasive Test Results: High-risk stress test findings: cardiac mortality >3%/year - Echo EF 10%  Prior CABG: No previous CABG   Bryan Lemmaavid Nevea Spiewak

## 2016-10-25 NOTE — Progress Notes (Signed)
Patient Name: [name] Date of Encounter: 10/25/2016  Primary Cardiologist: Dr. [facility] Problem List     Principal Problem:   Acute systolic congestive heart failure [facility]) Active Problems:   Diabetes mellitus type 2 in obese [facility])   Essential hypertension, benign   Acute renal failure (HCC)   Ischemic cardiomyopathy   CAD   Dyspnea on exertion   Hypoxia   Acute exacerbation of CHF (congestive heart failure) (HCC)   Elevated troponin    Patient Profile     45 y.o. male with a past medical history significant for artery disease with NSTEMI in November 2012, this demonstrated multivessel coronary disease and he underwent PCI with a Promus DES to the proximal LAD. He also has an ischemic cardiomyopathy with EF most recently 35-40% by echo with grade 2 diastolic dysfunction. EF has been as low as 15% in the past. There are also cardiac risk factors including type 2 diabetes, hypertension, dyslipidemia and obesity.  He presented to [facility] on 10/22/16 with worsening dyspnea, weight gain, elevated BNP of 755 and mildly elevated troponin of 0.06, 0.06, and 0.05. He was admitted for a/c systolic HF. Subsequent f/u 2D echo on 10/24/16 showed drop in EF, now at 10%. He is being referred for repeat R/LHC.    Subjective   Doing ok this morning. Currently CP free.   Inpatient Medications    Scheduled Meds: . aspirin EC  325 mg Oral Daily  . carvedilol  25 mg Oral BID  . furosemide  40 mg Intravenous Daily  . guaiFENesin  600 mg Oral BID  . insulin aspart  0-9 Units Subcutaneous TID WC  . isosorbide mononitrate  30 mg Oral Daily  . pantoprazole  40 mg Oral Daily  . sodium chloride flush  3 mL Intravenous Q12H  . sodium chloride flush  3 mL Intravenous Q12H  . spironolactone  50 mg Oral Daily   Continuous Infusions: . sodium chloride 30 mL/hr at 10/25/16 0622   PRN Meds: sodium chloride, sodium chloride, acetaminophen, nitroGLYCERIN, ondansetron (ZOFRAN) IV,  sodium chloride flush, sodium chloride flush   Vital Signs    Vitals:   10/23/16 2238 10/24/16 0609 10/24/16 1449 10/24/16 2111  BP: (!) 131/97 119/86 120/86 (!) 118/91  Pulse: 80 66 69 74  Resp:  16 16 18   Temp:  97.8 F (36.6 C) 97.7 F (36.5 C) 97.7 F (36.5 C)  TempSrc:  Axillary Oral Oral  SpO2:  100% 98% 98%  Weight:  296 lb 6.4 oz (134.4 kg)    Height:        Intake/Output Summary (Last 24 hours) at 10/25/16 0903 Last data filed at 10/25/16 0622  Gross per 24 hour  Intake                0 ml  Output             1375 ml  Net            -1375 ml   Filed Weights   10/23/16 0537 10/23/16 0625 10/24/16 0609  Weight: (!) 308 lb (139.7 kg) (!) 306 lb 9.6 oz (139.1 kg) 296 lb 6.4 oz (134.4 kg)    Physical Exam   GEN: Well nourished, well developed, in no acute distress.  HEENT: Grossly normal.  Neck: Supple, no JVD, carotid bruits, or masses. Cardiac: RRR, no murmurs, rubs, or gallops. No clubbing, cyanosis, edema.  Radials/DP/PT 2+ and equal bilaterally.  Respiratory:  Respirations regular and  unlabored, clear to auscultation bilaterally. GI: Soft, nontender, nondistended, BS + x 4. MS: no deformity or atrophy. Skin: warm and dry, no rash. Neuro:  Strength and sensation are intact. Psych: AAOx3.  Normal affect.  Labs    CBC  Recent Labs  10/23/16 0505 10/24/16 0534  WBC 8.1 9.2  HGB 13.0 14.2  HCT 40.2 43.8  MCV 80.7 80.4  PLT 186 236   Basic Metabolic Panel  Recent Labs  10/24/16 0534 10/25/16 0532  NA 137 139  K 4.0 4.2  CL 101 101  CO2 30 27  GLUCOSE 141* 148*  BUN 33* 36*  CREATININE 1.74* 1.67*  CALCIUM 9.0 9.2   Liver Function Tests  Recent Labs  10/22/16 2246  AST 30  ALT 35  ALKPHOS 38  BILITOT 1.1  PROT 6.9  ALBUMIN 3.9    Recent Labs  10/22/16 2246  LIPASE 34   Cardiac Enzymes  Recent Labs  10/23/16 0809 10/23/16 1249 10/23/16 2009  TROPONINI 0.05* 0.05* 0.05*   BNP Invalid input(s):  POCBNP D-Dimer  Recent Labs  10/23/16 0505  DDIMER 0.52*   Hemoglobin A1C No results for input(s): HGBA1C in the last 72 hours. Fasting Lipid Panel No results for input(s): CHOL, HDL, LDLCALC, TRIG, CHOLHDL, LDLDIRECT in the last 72 hours. Thyroid Function Tests No results for input(s): TSH, T4TOTAL, T3FREE, THYROIDAB in the last 72 hours.  Invalid input(s): FREET3   Radiology    No results found.  Cardiac Studies   [facility]   Patient Profile     45 y.o. male with a past medical history significant for artery disease with NSTEMI in November 2012, this demonstrated multivessel coronary disease and he underwent PCI with a Promus DES to the proximal LAD. He also has an ischemic cardiomyopathy with EF most recently 35-40% by echo with grade 2 diastolic dysfunction. EF has been as low as 15% in the past. There are also cardiac risk factors including type 2 diabetes, hypertension, dyslipidemia and obesity.  [facility] with worsening dyspnea, weight gain, elevated BNP of 755 and mildly elevated troponin of 0.06, 0.06, and 0.05. He was admitted for a/c systolic HF. Subsequent f/u 2D echo on 10/24/16 showed drop in EF, now at 10%. He is being referred for repeat R/LHC.   Assessment & Plan    1. Acute on Chronic Systolic CHF: breathing improved. -1L out yesterday. Net I/Os negative 4.2 L since admit. 2D echo this admit shows worsened LV systolic function with EF now at 10%. [facility] right heart pressures to help guide further diureses and to assess  for obstructive CAD. Low sodium diet. Continue daily weights and strict I/Os. Continue medical management w/ BB, Aldactone and Nitrate. No ACE/ARB given renal insufficiency.   2. CAD: h/o NSTEMI in 2012 with PCI + stent to proximal LAD. He denies and current CP, however EF now reduced to 10%. Troponins are mildly elevated. [facility]   Signed, Robbie LisBrittainy Simmons, PA-C   10/25/2016, 9:03 AM

## 2016-10-25 NOTE — Progress Notes (Signed)
Venous sheath removed at 1835. Pressure applied for 15 minutes. No complications. Pressure dressing applied. Pt educated on holding pressure with coughing, laughing, etc. BP stable throughout. Will continue to monitors.

## 2016-10-25 NOTE — Progress Notes (Signed)
PROGRESS NOTE    [name]  ZOX:[id number] DOB: July 12, 1971 DOA: 10/22/2016 PCP: No PCP Per Patient    Brief Narrative:  [name] is a 45 y.o. male with medical history significant of HTN, systolic CHF last EF 35-40% in 12/2014, ischemic cardiomyopathy, MI, CAD, and HLD; who presents with complaints of intermittent shortness of breath over the last 3-4 weeks.   Assessment & Plan:   Principal Problem:   Acute systolic congestive heart failure (HCC) Active Problems:   Diabetes mellitus type 2 in obese [facility])   Essential hypertension, benign   Acute renal failure (HCC)   Ischemic cardiomyopathy   CAD   Dyspnea on exertion   Hypoxia   Acute exacerbation of CHF (congestive heart failure) (HCC)   Elevated troponin   Acute systolic CHF exacerbation/ CAD.  Admitted to telemetry, starte don IV lasix,, daily weights, strict intake and output. Echocardiogram reviewed.  Cardiology consulted and recommendations. Underwent cardiac cath on 10/27 showing severe multivessel disease with severe ischemic myopathy.  A stent was successfully placed - from distal OM 2 up to the native circumflex, another stent  was successfully placed into the proximal circumflex, and overlaps previously placed stent. He was transferred to [facility] for severe cardiac myopathy.  Further management as per cardiology.    Diabetes Mellitus: CBG (last 3)   Recent Labs  10/24/16 2111 10/25/16 0752 10/25/16 1820  GLUCAP 180* 133* 112*    Resume SSI.    Elevated troponins:  no chest pain today. . S/o cardiac cath and PCI.    Hypertension: controlled.   Mild acute kidney injury: Decreased the dose of lasix to daily.  Monitor renal parameters daily.         DVT prophylaxis: Heparin Code Status: full code.  Family Communication: none at bedside.  Disposition Plan: pending further evaluation.    Consultants:   Cardiology.   Procedures: none.    Antimicrobials: none.     Subjective: No new complaints.   Objective: Vitals:   10/25/16 1845 10/25/16 1850 10/25/16 1900 10/25/16 2000  BP: 114/83 115/78 125/82   Pulse: 71 68 72 69  Resp: (!) 25 16 15 16   Temp:    97.2 F (36.2 C)  TempSrc:    Oral  SpO2: 99% 99% 100% 99%  Weight:      Height:        Intake/Output Summary (Last 24 hours) at 10/25/16 2035 Last data filed at 10/25/16 1854  Gross per 24 hour  Intake           525.42 ml  Output             2775 ml  Net         -2249.58 ml   Filed Weights   10/23/16 0537 10/23/16 0625 10/24/16 0609  Weight: (!) 139.7 kg (308 lb) (!) 139.1 kg (306 lb 9.6 oz) 134.4 kg (296 lb 6.4 oz)    Examination:  General exam: Appears calm and comfortable  Respiratory system: Clear to auscultation. Respiratory effort normal. Cardiovascular system: S1 & S2 heard, RRR. No JVD, murmurs, rubs, gallops or clicks. No pedal edema. Gastrointestinal system: Abdomen is nondistended, soft and nontender. No organomegaly or masses felt. Normal bowel sounds heard. Central nervous system: Alert and oriented. No focal neurological deficits. Extremities: Symmetric 5 x 5 power. Skin: No rashes, lesions or ulcers Psychiatry: Judgement and insight appear normal. Mood & affect appropriate.     Data Reviewed: I have personally reviewed following labs and imaging  studies  CBC:  Recent Labs Lab 10/22/16 2246 10/23/16 0505 10/24/16 0534  WBC 10.7* 8.1 9.2  HGB 14.1 13.0 14.2  HCT 41.2 40.2 43.8  MCV 77.7* 80.7 80.4  PLT 207 186 236   Basic Metabolic Panel:  Recent Labs Lab 10/22/16 2246 10/23/16 1249 10/24/16 0534 10/25/16 0532  NA 138 138 137 139  K 4.4 4.0 4.0 4.2  CL 107 105 101 101  CO2 21* 24 30 27   GLUCOSE 135* 113* 141* 148*  BUN 35* 35* 33* 36*  CREATININE 1.70* 1.55* 1.74* 1.67*  CALCIUM 9.0 8.9 9.0 9.2   GFR: Estimated Creatinine Clearance (by C-G formula based on SCr of 1.67 mg/dL (H)) Male: 16.173.4 mL/min Male: 88.5 mL/min Liver Function  Tests:  Recent Labs Lab 10/22/16 2246  AST 30  ALT 35  ALKPHOS 38  BILITOT 1.1  PROT 6.9  ALBUMIN 3.9    Recent Labs Lab 10/22/16 2246  LIPASE 34   No results for input(s): AMMONIA in the last 168 hours. Coagulation Profile:  Recent Labs Lab 10/23/16 0505  INR 1.26   Cardiac Enzymes:  Recent Labs Lab 10/22/16 2246 10/23/16 0117 10/23/16 0809 10/23/16 1249 10/23/16 2009  TROPONINI 0.06* 0.06* 0.05* 0.05* 0.05*   BNP (last 3 results) No results for input(s): PROBNP in the last 8760 hours. HbA1C: No results for input(s): HGBA1C in the last 72 hours. CBG:  Recent Labs Lab 10/24/16 1208 10/24/16 1707 10/24/16 2111 10/25/16 0752 10/25/16 1820  GLUCAP 140* 143* 180* 133* 112*   Lipid Profile: No results for input(s): CHOL, HDL, LDLCALC, TRIG, CHOLHDL, LDLDIRECT in the last 72 hours. Thyroid Function Tests: No results for input(s): TSH, T4TOTAL, FREET4, T3FREE, THYROIDAB in the last 72 hours. Anemia Panel: No results for input(s): VITAMINB12, FOLATE, FERRITIN, TIBC, IRON, RETICCTPCT in the last 72 hours. Sepsis Labs: No results for input(s): PROCALCITON, LATICACIDVEN in the last 168 hours.  Recent Results (from the past 240 hour(s))  MRSA PCR Screening     Status: None   Collection Time: 10/25/16  3:50 PM  Result Value Ref Range Status   MRSA by PCR NEGATIVE NEGATIVE Final    Comment:        The GeneXpert MRSA Assay (FDA approved for NASAL specimens only), is one component of a comprehensive MRSA colonization surveillance program. It is not intended to diagnose MRSA infection nor to guide or monitor treatment for MRSA infections.          Radiology Studies: No results found.      Scheduled Meds: . aspirin  81 mg Oral Daily  . aspirin EC  325 mg Oral Daily  . carvedilol  25 mg Oral BID  . furosemide  40 mg Intravenous BID  . guaiFENesin  600 mg Oral BID  . heparin  5,000 Units Subcutaneous Q8H  . insulin aspart  0-9 Units  Subcutaneous TID WC  . isosorbide mononitrate  30 mg Oral Daily  . pantoprazole  40 mg Oral Daily  . sodium chloride flush  3 mL Intravenous Q12H  . sodium chloride flush  3 mL Intravenous Q12H  . spironolactone  50 mg Oral Daily  . ticagrelor  90 mg Oral BID   Continuous Infusions: . sodium chloride 125 mL/hr (10/25/16 1543)     LOS: 2 days    Time spent: 35 minutes    Raynie Steinhaus, MD [facility] Pager [phone]  If 7PM-7AM, please contact night-coverage [email or web address] Password [email or web address] 10/25/2016, 8:35 PM

## 2016-10-25 NOTE — H&P (View-Only) (Signed)
DAILY PROGRESS NOTE  Subjective:  Feels better. Can lay flat. Diuresed about 4L negative so far - Creatinine rose overnight from 1.55-1.74. Troponin mildly elevated and flat at 0.05. Echo performed this morning - LVEF is now severely reduced at approximately 10% with severe global hypokinesis, restrictive filling pressures, severe MR and mildly reduced RV function.   Objective:  Temp:  [97.3 F (36.3 C)-97.8 F (36.6 C)] 97.8 F (36.6 C) (10/26 0609) Pulse Rate:  [66-80] 66 (10/26 0609) Resp:  [14-19] 16 (10/26 0609) BP: (106-131)/(67-97) 119/86 (10/26 0609) SpO2:  [98 %-100 %] 100 % (10/26 0609) Weight:  [296 lb 6.4 oz (134.4 kg)] 296 lb 6.4 oz (134.4 kg) (10/26 0609) Weight change: 3 lb 6.4 oz (1.542 kg)  Intake/Output from previous day: 10/25 0701 - 10/26 0700 In: 480 [P.O.:480] Out: 3750 [Urine:3750]  Intake/Output from this shift: Total I/O In: 360 [P.O.:360] Out: 975 [Urine:975]  Medications: No current facility-administered medications on file prior to encounter.    Current Outpatient Prescriptions on File Prior to Encounter  Medication Sig Dispense Refill  . aspirin EC 325 MG tablet Take 1 tablet (325 mg total) by mouth daily. 30 tablet 0  . carvedilol (COREG) 25 MG tablet TAKE ONE TABLET BY MOUTH TWICE DAILY 180 tablet 3  . furosemide (LASIX) 20 MG tablet Take 1 tablet (20 mg total) by mouth daily. KEEP OV. 30 tablet 0  . glipiZIDE (GLUCOTROL) 10 MG tablet Take 1 tablet (10 mg total) by mouth 2 (two) times daily before a meal. 180 tablet 3  . isosorbide mononitrate (IMDUR) 30 MG 24 hr tablet Take 1 tablet (30 mg total) by mouth daily. 90 tablet 3  . lisinopril (PRINIVIL,ZESTRIL) 20 MG tablet Take 20 mg by mouth 2 (two) times daily.    . nitroGLYCERIN (NITROSTAT) 0.4 MG SL tablet Place 1 tablet (0.4 mg total) under the tongue every 5 (five) minutes x 3 doses as needed for chest pain. 20 tablet 1  . omeprazole (PRILOSEC) 20 MG capsule Take 20 mg by mouth daily.      [name] spironolactone (ALDACTONE) 50 MG tablet TAKE ONE TABLET BY MOUTH ONCE DAILY 30 tablet 5  . amLODipine (NORVASC) 10 MG tablet Take 1 tablet (10 mg total) by mouth daily. (Patient not taking: Reported on 10/23/2016) 90 tablet 3  . spironolactone (ALDACTONE) 25 MG tablet Take 1 tablet (25 mg total) by mouth daily. (Patient not taking: Reported on 10/23/2016) 30 tablet 11    Physical Exam: General appearance: alert and no distress Neck: JVD - 3 cm above sternal notch and no carotid bruit Lungs: clear to auscultation bilaterally Heart: regular rate and rhythm Abdomen: soft, non-tender; bowel sounds normal; no masses,  no organomegaly and obese Extremities: extremities normal, atraumatic, no cyanosis or edema Pulses: 2+ and symmetric Skin: Skin color, texture, turgor normal. No rashes or lesions Neurologic: Grossly normal Psych: Pleasant  Lab Results: Results for orders placed or performed during the hospital encounter of 10/22/16 (from the past 48 hour(s))  Lipase, blood     Status: None   Collection Time: 10/22/16 10:46 PM  Result Value Ref Range   Lipase 34 11 - 51 U/L  Comprehensive metabolic panel     Status: Abnormal   Collection Time: 10/22/16 10:46 PM  Result Value Ref Range   Sodium 138 135 - 145 mmol/L   Potassium 4.4 3.5 - 5.1 mmol/L   Chloride 107 101 - 111 mmol/L   CO2 21 (L) 22 - 32 mmol/L  Glucose, Bld 135 (H) 65 - 99 mg/dL   BUN 35 (H) 6 - 20 mg/dL   Creatinine, Ser 1.70 (H) 0.61 - 1.24 mg/dL   Calcium 9.0 8.9 - 10.3 mg/dL   Total Protein 6.9 6.5 - 8.1 g/dL   Albumin 3.9 3.5 - 5.0 g/dL   AST 30 15 - 41 U/L   ALT 35 17 - 63 U/L   Alkaline Phosphatase 38 38 - 126 U/L   Total Bilirubin 1.1 0.3 - 1.2 mg/dL   GFR calc non Af Amer 48 (L) >60 mL/min   GFR calc Af Amer 56 (L) >60 mL/min    Comment: (NOTE) The eGFR has been calculated using the CKD EPI equation. This calculation has not been validated in all clinical situations. eGFR's persistently <60 mL/min  signify possible Chronic Kidney Disease.    Anion gap 10 5 - 15  CBC     Status: Abnormal   Collection Time: 10/22/16 10:46 PM  Result Value Ref Range   WBC 10.7 (H) 4.0 - 10.5 K/uL   RBC 5.30 4.22 - 5.81 MIL/uL   Hemoglobin 14.1 13.0 - 17.0 g/dL   HCT 41.2 39.0 - 52.0 %   MCV 77.7 (L) 78.0 - 100.0 fL   MCH 26.6 26.0 - 34.0 pg   MCHC 34.2 30.0 - 36.0 g/dL   RDW 13.9 11.5 - 15.5 %   Platelets 207 150 - 400 K/uL  Brain natriuretic peptide     Status: Abnormal   Collection Time: 10/22/16 10:46 PM  Result Value Ref Range   B Natriuretic Peptide 755.4 (H) 0.0 - 100.0 pg/mL  Troponin I     Status: Abnormal   Collection Time: 10/22/16 10:46 PM  Result Value Ref Range   Troponin I 0.06 (HH) <0.03 ng/mL    Comment: CRITICAL RESULT CALLED TO, READ BACK BY AND VERIFIED WITH: ABBIE LEDWELL,RN [id number] @ 2343 BY J SCOTTON   Urinalysis, Routine w reflex microscopic     Status: Abnormal   Collection Time: 10/22/16 11:27 PM  Result Value Ref Range   Color, Urine AMBER (A) YELLOW    Comment: BIOCHEMICALS MAY BE AFFECTED BY COLOR   APPearance CLEAR CLEAR   Specific Gravity, Urine 1.037 (H) 1.005 - 1.030   pH 6.0 5.0 - 8.0   Glucose, UA NEGATIVE NEGATIVE mg/dL   Hgb urine dipstick NEGATIVE NEGATIVE   Bilirubin Urine SMALL (A) NEGATIVE   Ketones, ur NEGATIVE NEGATIVE mg/dL   Protein, ur >300 (A) NEGATIVE mg/dL   Nitrite NEGATIVE NEGATIVE   Leukocytes, UA NEGATIVE NEGATIVE  Urine microscopic-add on     Status: Abnormal   Collection Time: 10/22/16 11:27 PM  Result Value Ref Range   Squamous Epithelial / LPF 0-5 (A) NONE SEEN   WBC, UA 0-5 0 - 5 WBC/hpf   RBC / HPF 0-5 0 - 5 RBC/hpf   Bacteria, UA FEW (A) NONE SEEN   Casts HYALINE CASTS (A) NEGATIVE    Comment: GRANULAR CAST   Urine-Other MUCOUS PRESENT   Troponin I     Status: Abnormal   Collection Time: 10/23/16  1:17 AM  Result Value Ref Range   Troponin I 0.06 (HH) <0.03 ng/mL    Comment: CRITICAL VALUE NOTED.  VALUE IS CONSISTENT  WITH PREVIOUSLY REPORTED AND CALLED VALUE.  APTT     Status: None   Collection Time: 10/23/16  5:05 AM  Result Value Ref Range   aPTT 27 24 - 36 seconds  Protime-INR  Status: Abnormal   Collection Time: 10/23/16  5:05 AM  Result Value Ref Range   Prothrombin Time 15.9 (H) 11.4 - 15.2 seconds   INR 1.26   CBC     Status: None   Collection Time: 10/23/16  5:05 AM  Result Value Ref Range   WBC 8.1 4.0 - 10.5 K/uL   RBC 4.98 4.22 - 5.81 MIL/uL   Hemoglobin 13.0 13.0 - 17.0 g/dL   HCT 15.5 20.8 - 02.2 %   MCV 80.7 78.0 - 100.0 fL   MCH 26.1 26.0 - 34.0 pg   MCHC 32.3 30.0 - 36.0 g/dL   RDW 33.6 12.2 - 44.9 %   Platelets 186 150 - 400 K/uL  D-dimer, quantitative     Status: Abnormal   Collection Time: 10/23/16  5:05 AM  Result Value Ref Range   D-Dimer, Quant 0.52 (H) 0.00 - 0.50 ug/mL-FEU    Comment: (NOTE) At the manufacturer cut-off of 0.50 ug/mL FEU, this assay has been documented to exclude PE with a sensitivity and negative predictive value of 97 to 99%.  At this time, this assay has not been approved by the FDA to exclude DVT/VTE. Results should be correlated with clinical presentation.   Glucose, capillary     Status: Abnormal   Collection Time: 10/23/16  7:39 AM  Result Value Ref Range   Glucose-Capillary 131 (H) 65 - 99 mg/dL  Troponin I     Status: Abnormal   Collection Time: 10/23/16  8:09 AM  Result Value Ref Range   Troponin I 0.05 (HH) <0.03 ng/mL    Comment: CRITICAL VALUE NOTED.  VALUE IS CONSISTENT WITH PREVIOUSLY REPORTED AND CALLED VALUE.  Glucose, capillary     Status: Abnormal   Collection Time: 10/23/16 11:37 AM  Result Value Ref Range   Glucose-Capillary 108 (H) 65 - 99 mg/dL  Heparin level (unfractionated)     Status: None   Collection Time: 10/23/16 12:49 PM  Result Value Ref Range   Heparin Unfractionated 0.54 0.30 - 0.70 IU/mL    Comment:        IF HEPARIN RESULTS ARE BELOW EXPECTED VALUES, AND PATIENT DOSAGE HAS BEEN CONFIRMED, SUGGEST  FOLLOW UP TESTING OF ANTITHROMBIN III LEVELS.   Troponin I     Status: Abnormal   Collection Time: 10/23/16 12:49 PM  Result Value Ref Range   Troponin I 0.05 (HH) <0.03 ng/mL    Comment: CRITICAL VALUE NOTED.  VALUE IS CONSISTENT WITH PREVIOUSLY REPORTED AND CALLED VALUE.  Basic metabolic panel     Status: Abnormal   Collection Time: 10/23/16 12:49 PM  Result Value Ref Range   Sodium 138 135 - 145 mmol/L   Potassium 4.0 3.5 - 5.1 mmol/L   Chloride 105 101 - 111 mmol/L   CO2 24 22 - 32 mmol/L   Glucose, Bld 113 (H) 65 - 99 mg/dL   BUN 35 (H) 6 - 20 mg/dL   Creatinine, Ser 7.53 (H) 0.61 - 1.24 mg/dL   Calcium 8.9 8.9 - 00.5 mg/dL   GFR calc non Af Amer 54 (L) >60 mL/min   GFR calc Af Amer >60 >60 mL/min    Comment: (NOTE) The eGFR has been calculated using the CKD EPI equation. This calculation has not been validated in all clinical situations. eGFR's persistently <60 mL/min signify possible Chronic Kidney Disease.    Anion gap 9 5 - 15  Glucose, capillary     Status: Abnormal   Collection Time: 10/23/16  5:11  PM  Result Value Ref Range   Glucose-Capillary 138 (H) 65 - 99 mg/dL  Glucose, capillary     Status: Abnormal   Collection Time: 10/23/16  7:58 PM  Result Value Ref Range   Glucose-Capillary 144 (H) 65 - 99 mg/dL  Troponin I     Status: Abnormal   Collection Time: 10/23/16  8:09 PM  Result Value Ref Range   Troponin I 0.05 (HH) <0.03 ng/mL    Comment: CRITICAL VALUE NOTED.  VALUE IS CONSISTENT WITH PREVIOUSLY REPORTED AND CALLED VALUE.  CBC     Status: None   Collection Time: 10/24/16  5:34 AM  Result Value Ref Range   WBC 9.2 4.0 - 10.5 K/uL   RBC 5.45 4.22 - 5.81 MIL/uL   Hemoglobin 14.2 13.0 - 17.0 g/dL   HCT 43.8 39.0 - 52.0 %   MCV 80.4 78.0 - 100.0 fL   MCH 26.1 26.0 - 34.0 pg   MCHC 32.4 30.0 - 36.0 g/dL   RDW 14.1 11.5 - 15.5 %   Platelets 236 150 - 400 K/uL  Basic metabolic panel     Status: Abnormal   Collection Time: 10/24/16  5:34 AM  Result  Value Ref Range   Sodium 137 135 - 145 mmol/L   Potassium 4.0 3.5 - 5.1 mmol/L   Chloride 101 101 - 111 mmol/L   CO2 30 22 - 32 mmol/L   Glucose, Bld 141 (H) 65 - 99 mg/dL   BUN 33 (H) 6 - 20 mg/dL   Creatinine, Ser 1.74 (H) 0.61 - 1.24 mg/dL   Calcium 9.0 8.9 - 10.3 mg/dL   GFR calc non Af Amer 47 (L) >60 mL/min   GFR calc Af Amer 54 (L) >60 mL/min    Comment: (NOTE) The eGFR has been calculated using the CKD EPI equation. This calculation has not been validated in all clinical situations. eGFR's persistently <60 mL/min signify possible Chronic Kidney Disease.    Anion gap 6 5 - 15  Glucose, capillary     Status: Abnormal   Collection Time: 10/24/16  7:39 AM  Result Value Ref Range   Glucose-Capillary 109 (H) 65 - 99 mg/dL   Comment 1 Notify RN    Comment 2 Document in Chart   Glucose, capillary     Status: Abnormal   Collection Time: 10/24/16 12:08 PM  Result Value Ref Range   Glucose-Capillary 140 (H) 65 - 99 mg/dL   Comment 1 Notify RN    Comment 2 Document in Chart     Imaging: Dg Chest 2 View  Result Date: 10/22/2016 CLINICAL DATA:  Cough, shortness of breath for 2 weeks. Near syncope tonight. History of diabetes, CHF. EXAM: CHEST  2 VIEW COMPARISON:  None. FINDINGS: Cardiac silhouette is moderately enlarged, mediastinal silhouette is nonsuspicious. Pulmonary vascular congestion without pleural effusion or focal consolidation. No pneumothorax. Soft tissue planes and included osseous structure nonsuspicious. IMPRESSION: Stable cardiomegaly and pulmonary vascular congestion. Electronically Signed   By: Elon Alas M.D.   On: 10/22/2016 23:29    Assessment:  1. Principal Problem: 2.   Acute exacerbation of CHF (congestive heart failure) ([place]) 3. Active Problems: 4.   Diabetes mellitus type 2 in obese ([place]) 5.   Essential hypertension, benign 6.   Acute renal failure ([name]) 7.   Ischemic cardiomyopathy 8.   CAD 9.   Dyspnea on exertion 10.   Hypoxia 11.    Elevated troponin 12.   Plan:  1. Mr. Clink feels  much better after almost 4L diuresis overnight- creatinine has acutely risen. Echo today shows severe LV dysfunction with EF 10%. Given history of CAD in the past, would recommend [facility] tomorrow. Decrease lasix to 40 mg IV daily. Keep NPO p MN - gentle hydration tomorrow am. Cath scheduled with Dr. Ellyn Hack at 10:30 am.  Time Spent Directly with Patient:  15 minutes   Length of Stay:  LOS: 1 day   Pixie Casino, MD, [facility] Attending Cardiologist [place] 10/24/2016, 1:12 PM

## 2016-10-26 DIAGNOSIS — I5021 Acute systolic (congestive) heart failure: Secondary | ICD-10-CM

## 2016-10-26 LAB — BASIC METABOLIC PANEL
Anion gap: 9 (ref 5–15)
BUN: 32 mg/dL — AB (ref 6–20)
CALCIUM: 8.9 mg/dL (ref 8.9–10.3)
CO2: 28 mmol/L (ref 22–32)
CREATININE: 1.77 mg/dL — AB (ref 0.61–1.24)
Chloride: 100 mmol/L — ABNORMAL LOW (ref 101–111)
GFR calc Af Amer: 53 mL/min — ABNORMAL LOW (ref >60)
GFR, EST NON AFRICAN AMERICAN: 46 mL/min — AB (ref >60)
GLUCOSE: 214 mg/dL — AB (ref 65–99)
Potassium: 3.7 mmol/L (ref 3.5–5.1)
SODIUM: 137 mmol/L (ref 135–145)

## 2016-10-26 LAB — POCT ACTIVATED CLOTTING TIME: ACTIVATED CLOTTING TIME: 147 s

## 2016-10-26 LAB — CBC
HCT: 42.9 % (ref 39.0–52.0)
Hemoglobin: 14 g/dL (ref 13.0–17.0)
MCH: 25.9 pg — ABNORMAL LOW (ref 26.0–34.0)
MCHC: 32.6 g/dL (ref 30.0–36.0)
MCV: 79.3 fL (ref 78.0–100.0)
PLATELETS: 235 10*3/uL (ref 150–400)
RBC: 5.41 MIL/uL (ref 4.22–5.81)
RDW: 14.1 % (ref 11.5–15.5)
WBC: 10.9 10*3/uL — ABNORMAL HIGH (ref 4.0–10.5)

## 2016-10-26 LAB — COOXEMETRY PANEL
CARBOXYHEMOGLOBIN: 1.5 % (ref 0.5–1.5)
Carboxyhemoglobin: 0.9 % (ref 0.5–1.5)
Methemoglobin: 1.1 % (ref 0.0–1.5)
Methemoglobin: 0.8 % (ref 0.0–1.5)
O2 SAT: 65.2 %
O2 Saturation: 45.3 %
TOTAL HEMOGLOBIN: 14.4 g/dL (ref 12.0–16.0)
TOTAL HEMOGLOBIN: 14.7 g/dL (ref 12.0–16.0)

## 2016-10-26 LAB — GLUCOSE, CAPILLARY
GLUCOSE-CAPILLARY: 138 mg/dL — AB (ref 65–99)
GLUCOSE-CAPILLARY: 156 mg/dL — AB (ref 65–99)
Glucose-Capillary: 162 mg/dL — ABNORMAL HIGH (ref 65–99)

## 2016-10-26 MED ORDER — CARVEDILOL 12.5 MG PO TABS
12.5000 mg | ORAL_TABLET | Freq: Two times a day (BID) | ORAL | Status: DC
  Administered 2016-10-26 (×2): 12.5 mg via ORAL
  Filled 2016-10-26 (×2): qty 1

## 2016-10-26 MED ORDER — ATORVASTATIN CALCIUM 40 MG PO TABS
40.0000 mg | ORAL_TABLET | Freq: Every day | ORAL | Status: DC
  Administered 2016-10-26 – 2016-10-29 (×4): 40 mg via ORAL
  Filled 2016-10-26 (×4): qty 1

## 2016-10-26 MED ORDER — SODIUM CHLORIDE 0.9% FLUSH: 10.0000 mL | INTRAVENOUS | Status: DC | PRN

## 2016-10-26 MED ORDER — DIGOXIN 125 MCG PO TABS
0.1250 mg | ORAL_TABLET | Freq: Every day | ORAL | Status: DC
  Administered 2016-10-26 – 2016-10-30 (×5): 0.125 mg via ORAL
  Filled 2016-10-26 (×5): qty 1

## 2016-10-26 MED ORDER — SODIUM CHLORIDE 0.9% FLUSH
10.0000 mL | Freq: Two times a day (BID) | INTRAVENOUS | Status: DC
  Administered 2016-10-26 – 2016-10-30 (×8): 10 mL

## 2016-10-26 MED ORDER — MILRINONE LACTATE IN DEXTROSE 20-5 MG/100ML-% IV SOLN
0.1250 ug/kg/min | INTRAVENOUS | Status: DC
  Administered 2016-10-26 – 2016-10-28 (×4): 0.25 ug/kg/min via INTRAVENOUS
  Administered 2016-10-28 – 2016-10-29 (×2): 0.125 ug/kg/min via INTRAVENOUS
  Filled 2016-10-26 (×6): qty 100

## 2016-10-26 NOTE — Progress Notes (Signed)
[facility] TEAM 1 - Stepdown/ICU TEAM  Cammie McgeeHarold Gillihan  QMV:[id number]:[id number] DOB: 1971-06-23 DOA: 10/22/2016 PCP: No PCP Per Patient    Brief Narrative:  45 y.o. male with history of HTN, systolic CHF (EF 28-41%35-40% Jan 2016), CAD w/ MI, and HLD who presented with complaints of intermittent shortness of breath over 3-4 weeks. Symptoms usually worsened with exertion and when laying flat. He noted a baseline weight of 290 pounds.  In the ED his weight was noted to be 308.   Subjective: Chart review completed.  All active issues currently being addressed by Cardiology.  Will f/u in AM.  Nothing to add to current care.    Assessment & Plan:  Acute on chronic systolic CHF EF down to 10% on echo this admission - RHC with markedly elevated right and left heart filling pressures and pulmonary venous hypertension - CHF Team directing care - milrinone being considered  Filed Weights   10/23/16 0625 10/24/16 0609 10/26/16 0500  Weight: (!) 139.1 kg (306 lb 9.6 oz) 134.4 kg (296 lb 6.4 oz) 129.4 kg (285 lb 3.2 oz)    CKD Stage III crt appears stable for now   Recent Labs Lab 10/22/16 2246 10/23/16 1249 10/24/16 0534 10/25/16 0532 10/26/16 0257  CREATININE 1.70* 1.55* 1.74* 1.67* 1.77*    CAD Cath 10/27 noted severe multivessel CAD - DES placed in distal OM2 and prox circ - per Cardiology "LCx system revascularized, has significant distal RCA/PDA disease.  Would favor revascularization once he is diuresed." - plan is for eventual PCI to distal RCA/PDA  DM Well controlled at this time   HTN BP well controlled   Suspected OSA  DVT prophylaxis: SQ heparin  Code Status: FULL CODE Family Communication: no family present at time of exam  Disposition Plan: per Cardiology   [facility]   Procedures: 10/26 TTE - EF 10% - diffuse hypokinesis  10/27 - cardiac cath - DES x2  Antimicrobials:  none  Objective: Blood pressure 125/87, pulse 69, temperature 97.5 F (36.4 C),  temperature source Oral, resp. rate 14, height 6\' 6"  (1.981 m), weight 129.4 kg (285 lb 3.2 oz), SpO2 97 %.  Intake/Output Summary (Last 24 hours) at 10/26/16 0920 Last data filed at 10/26/16 0400  Gross per 24 hour  Intake          1020.42 ml  Output             3375 ml  Net         -2354.58 ml   Filed Weights   10/23/16 0625 10/24/16 0609 10/26/16 0500  Weight: (!) 139.1 kg (306 lb 9.6 oz) 134.4 kg (296 lb 6.4 oz) 129.4 kg (285 lb 3.2 oz)    Examination: No exam today (no charge)  CBC:  Recent Labs Lab 10/22/16 2246 10/23/16 0505 10/24/16 0534 10/26/16 0257  WBC 10.7* 8.1 9.2 10.9*  HGB 14.1 13.0 14.2 14.0  HCT 41.2 40.2 43.8 42.9  MCV 77.7* 80.7 80.4 79.3  PLT 207 186 236 235   Basic Metabolic Panel:  Recent Labs Lab 10/22/16 2246 10/23/16 1249 10/24/16 0534 10/25/16 0532 10/26/16 0257  NA 138 138 137 139 137  K 4.4 4.0 4.0 4.2 3.7  CL 107 105 101 101 100*  CO2 21* 24 30 27 28   GLUCOSE 135* 113* 141* 148* 214*  BUN 35* 35* 33* 36* 32*  CREATININE 1.70* 1.55* 1.74* 1.67* 1.77*  CALCIUM 9.0 8.9 9.0 9.2 8.9   GFR: Estimated Creatinine Clearance (  by C-G formula based on SCr of 1.77 mg/dL (H)) Male: 95.667.9 mL/min Male: 82 mL/min  Liver Function Tests:  Recent Labs Lab 10/22/16 2246  AST 30  ALT 35  ALKPHOS 38  BILITOT 1.1  PROT 6.9  ALBUMIN 3.9    Recent Labs Lab 10/22/16 2246  LIPASE 34    Coagulation Profile:  Recent Labs Lab 10/23/16 0505  INR 1.26    Cardiac Enzymes:  Recent Labs Lab 10/22/16 2246 10/23/16 0117 10/23/16 0809 10/23/16 1249 10/23/16 2009  TROPONINI 0.06* 0.06* 0.05* 0.05* 0.05*    HbA1C: Hgb A1c MFr Bld  Date/Time Value Ref Range Status  03/02/2015 04:21 PM 8.0 (H) <5.7 % Final    Comment:                                                                           According to the ADA Clinical Practice Recommendations for 2011, when HbA1c is used as a screening test:     >=6.5%   Diagnostic of  Diabetes Mellitus            (if abnormal result is confirmed)   5.7-6.4%   Increased risk of developing Diabetes Mellitus   References:Diagnosis and Classification of Diabetes Mellitus,Diabetes Care,2011,34(Suppl 1):S62-S69 and Standards of Medical Care in         Diabetes - 2011,Diabetes Care,2011,34 (Suppl 1):S11-S61.     06/14/2013 08:26 AM 11.4 (H) 4.6 - 6.5 % Final    Comment:    Glycemic Control Guidelines for People with Diabetes:Non Diabetic:  <6%Goal of Therapy: <7%Additional Action Suggested:  >8%     CBG:  Recent Labs Lab 10/24/16 1208 10/24/16 1707 10/24/16 2111 10/25/16 0752 10/25/16 1820  GLUCAP 140* 143* 180* 133* 112*    Recent Results (from the past 240 hour(s))  MRSA PCR Screening     Status: None   Collection Time: 10/25/16  3:50 PM  Result Value Ref Range Status   MRSA by PCR NEGATIVE NEGATIVE Final    Comment:        The GeneXpert MRSA Assay (FDA approved for NASAL specimens only), is one component of a comprehensive MRSA colonization surveillance program. It is not intended to diagnose MRSA infection nor to guide or monitor treatment for MRSA infections.      Scheduled Meds: . aspirin  81 mg Oral Daily  . atorvastatin  40 mg Oral q1800  . carvedilol  12.5 mg Oral BID  . digoxin  0.125 mg Oral Daily  . furosemide  40 mg Intravenous BID  . guaiFENesin  600 mg Oral BID  . heparin  5,000 Units Subcutaneous Q8H  . insulin aspart  0-9 Units Subcutaneous TID WC  . isosorbide mononitrate  30 mg Oral Daily  . pantoprazole  40 mg Oral Daily  . spironolactone  50 mg Oral Daily  . ticagrelor  90 mg Oral BID     LOS: 3 days   Lonia BloodJeffrey T. Annise Boran, MD [facility] Office  [phone] Pager - Text Page per Amion as per below:  On-Call/Text Page:      [email or web address]      password [email or web address]  If 7PM-7AM, please contact night-coverage [email or web address] Password [email or web address] 10/26/2016, 9:20 AM

## 2016-10-26 NOTE — Progress Notes (Addendum)
CARDIAC REHAB PHASE I   PRE:  Rate/Rhythm: 67 sinus rhythm  BP:  Supine:   Sitting: 121/92  Standing:    SaO2: 97% ra   MODE:  Ambulation: 700 ft   POST:  Rate/Rhythem: 72 sinus rhythm   BP:  Supine:  Sitting: 122/88  Standing:    SaO2: 99% ra  1135-1158  Pt ambulated in hallway x1 assist, asymptomatic, no difficulty.  Pt returned to bed, call light in reach. Family at bedside.  Basic CHF education given including daily weights, s/s to call MD.  Pt declined offer to watch video or receive CHF booklet.   CiscoJoann Hamilton Rion

## 2016-10-26 NOTE — Progress Notes (Signed)
Peripherally Inserted Central Catheter/Midline Placement  The IV Nurse has discussed with the patient and/or persons authorized to consent for the patient, the purpose of this procedure and the potential benefits and risks involved with this procedure.  The benefits include less needle sticks, lab draws from the catheter, and the patient may be discharged home with the catheter. Risks include, but not limited to, infection, bleeding, blood clot (thrombus formation), and puncture of an artery; nerve damage and irregular heartbeat and possibility to perform a PICC exchange if needed/ordered by physician.  Alternatives to this procedure were also discussed.  Bard Power PICC patient education guide, fact sheet on infection prevention and patient information card has been provided to patient /or left at bedside.    PICC/Midline Placement Documentation        Timmothy Soursewman, Jalyric Kaestner Renee 10/26/2016, 10:34 AM

## 2016-10-26 NOTE — Progress Notes (Signed)
Patient ID: Glenn McgeeHarold [name], male   DOB: July 15, 1971, 45 y.o.   MRN: [id number]   SUBJECTIVE: Diuresed well yesterday, breathing improving. No chest pain.  Creatinine up to 1.77.   LHC/RHC (10/25/16): Subtotal occlusion LCx/OM2 => DES x 2.  80% distal RCA, 90% ostial PDA => not addressed RA mean 20 PA 73/36 PCWP mean 28 CI 1.5 Fick CI 1.74 Thermo  Echo (10/17): EF 10%, severe MR likely functional   Vitals:   10/26/16 0355 10/26/16 0434 10/26/16 0500 10/26/16 0754  BP: 125/87 125/87    Pulse:      Resp: 14     Temp: 97.5 F (36.4 C)   97.5 F (36.4 C)  TempSrc: Oral   Oral  SpO2: 97%     Weight:   285 lb 3.2 oz (129.4 kg)   Height:        Intake/Output Summary (Last 24 hours) at 10/26/16 0833 Last data filed at 10/26/16 0400  Gross per 24 hour  Intake          1020.42 ml  Output             3375 ml  Net         -2354.58 ml    LABS: Basic Metabolic Panel:  Recent Labs  95/28/4110/27/17 0532 10/26/16 0257  NA 139 137  K 4.2 3.7  CL 101 100*  CO2 27 28  GLUCOSE 148* 214*  BUN 36* 32*  CREATININE 1.67* 1.77*  CALCIUM 9.2 8.9   Liver Function Tests: No results for input(s): AST, ALT, ALKPHOS, BILITOT, PROT, ALBUMIN in the last 72 hours. No results for input(s): LIPASE, AMYLASE in the last 72 hours. CBC:  Recent Labs  10/24/16 0534 10/26/16 0257  WBC 9.2 10.9*  HGB 14.2 14.0  HCT 43.8 42.9  MCV 80.4 79.3  PLT 236 235   Cardiac Enzymes:  Recent Labs  10/23/16 1249 10/23/16 2009  TROPONINI 0.05* 0.05*   BNP: Invalid input(s): POCBNP D-Dimer: No results for input(s): DDIMER in the last 72 hours. Hemoglobin A1C: No results for input(s): HGBA1C in the last 72 hours. Fasting Lipid Panel: No results for input(s): CHOL, HDL, LDLCALC, TRIG, CHOLHDL, LDLDIRECT in the last 72 hours. Thyroid Function Tests: No results for input(s): TSH, T4TOTAL, T3FREE, THYROIDAB in the last 72 hours.  Invalid input(s): FREET3 Anemia Panel: No results for input(s):  VITAMINB12, FOLATE, FERRITIN, TIBC, IRON, RETICCTPCT in the last 72 hours.  RADIOLOGY: Dg Chest 2 View  Result Date: 10/22/2016 CLINICAL DATA:  Cough, shortness of breath for 2 weeks. Near syncope tonight. History of diabetes, CHF. EXAM: CHEST  2 VIEW COMPARISON:  None. FINDINGS: Cardiac silhouette is moderately enlarged, mediastinal silhouette is nonsuspicious. Pulmonary vascular congestion without pleural effusion or focal consolidation. No pneumothorax. Soft tissue planes and included osseous structure nonsuspicious. IMPRESSION: Stable cardiomegaly and pulmonary vascular congestion. Electronically Signed   By: Awilda Metroourtnay  Bloomer M.D.   On: 10/22/2016 23:29    PHYSICAL EXAM General: NAD Neck: Thick, JVP difficult but appears elevated, no thyromegaly or thyroid nodule.  Lungs: Clear to auscultation bilaterally with normal respiratory effort. CV: Heart regular S1/S2, no S3/S4, 2/6 HSM apex.  No peripheral edema.  Abdomen: Soft, nontender, no hepatosplenomegaly, no distention.  Neurologic: Alert and oriented x 3.  Psych: Normal affect. Extremities: No clubbing or cyanosis.   TELEMETRY: Reviewed telemetry pt in NSR  ASSESSMENT AND PLAN: 45 yo with history of DM, CAD, ischemic cardiomyopathy presented with acute on chronic systolic CHF. 1. Acute on  chronic systolic CHF: Ischemic cardiomyopathy.  EF down to 10% on echo this admission.  RHC with markedly elevated right and left heart filling pressures and pulmonary venous hypertension.   Cardiac output was low.  He diuresed well yesterday on IV Lasix.  - Given low output on RHC, will place PICC to monitor CVP and co-ox.  If co-ox low today, will add milrinone.  - Continue Lasix 40 mg IV bid today.  - With low output and marked volume overload, decrease Coreg to 12.5 mg bid.  - Continue spironolactone.   - Would like to start Entresto eventually, will wait for creatinine to stabilize.  - Add digoxin 0.125 today.  - Will need to follow closely,  hopefully will have improvement in function with meds and revascularization.  - QRS not wide enough to benefit from CRT.  2. CAD: LCx system revascularized, has significant distal RCA/PDA disease.  Would favor revascularization once he is diuresed.  - Plan eventual PCI to distal RCA/PDA.  Would like to stabilize creatinine and diurese first.  - Continue ASA and ticagrelor.  - Add statin.  3. Suspect OSA: Will need sleep study as outpatient.  4. CKD: Stage III.  Creatinine up slightly today.  Follow closely, may need milrinone to support output while diuresing.   [name] 10/26/2016 8:41 AM

## 2016-10-27 LAB — COOXEMETRY PANEL
Carboxyhemoglobin: 1.6 % — ABNORMAL HIGH (ref 0.5–1.5)
Methemoglobin: 0.9 % (ref 0.0–1.5)
O2 Saturation: 63.4 %
Total hemoglobin: 14.8 g/dL (ref 12.0–16.0)

## 2016-10-27 LAB — CBC
HEMATOCRIT: 43 % (ref 39.0–52.0)
HEMOGLOBIN: 14.4 g/dL (ref 13.0–17.0)
MCH: 26.3 pg (ref 26.0–34.0)
MCHC: 33.5 g/dL (ref 30.0–36.0)
MCV: 78.6 fL (ref 78.0–100.0)
Platelets: 228 10*3/uL (ref 150–400)
RBC: 5.47 MIL/uL (ref 4.22–5.81)
RDW: 13.8 % (ref 11.5–15.5)
WBC: 9.6 10*3/uL (ref 4.0–10.5)

## 2016-10-27 LAB — GLUCOSE, CAPILLARY
GLUCOSE-CAPILLARY: 187 mg/dL — AB (ref 65–99)
GLUCOSE-CAPILLARY: 183 mg/dL — AB (ref 65–99)
Glucose-Capillary: 129 mg/dL — ABNORMAL HIGH (ref 65–99)
Glucose-Capillary: 175 mg/dL — ABNORMAL HIGH (ref 65–99)

## 2016-10-27 LAB — BASIC METABOLIC PANEL
Anion gap: 9 (ref 5–15)
BUN: 30 mg/dL — AB (ref 6–20)
CALCIUM: 9.3 mg/dL (ref 8.9–10.3)
CHLORIDE: 100 mmol/L — AB (ref 101–111)
CO2: 27 mmol/L (ref 22–32)
CREATININE: 1.56 mg/dL — AB (ref 0.61–1.24)
GFR calc Af Amer: >60 mL/min (ref >60)
GFR calc non Af Amer: 53 mL/min — ABNORMAL LOW (ref >60)
Glucose, Bld: 236 mg/dL — ABNORMAL HIGH (ref 65–99)
Potassium: 3.6 mmol/L (ref 3.5–5.1)
SODIUM: 136 mmol/L (ref 135–145)

## 2016-10-27 MED ORDER — LOSARTAN POTASSIUM 25 MG PO TABS
25.0000 mg | ORAL_TABLET | Freq: Every day | ORAL | Status: DC
  Administered 2016-10-27 – 2016-10-28 (×2): 25 mg via ORAL
  Filled 2016-10-27 (×2): qty 1

## 2016-10-27 MED ORDER — FUROSEMIDE 40 MG PO TABS
40.0000 mg | ORAL_TABLET | Freq: Every day | ORAL | Status: DC
  Administered 2016-10-27 – 2016-10-28 (×2): 40 mg via ORAL
  Filled 2016-10-27 (×2): qty 1

## 2016-10-27 MED ORDER — CARVEDILOL 6.25 MG PO TABS
6.2500 mg | ORAL_TABLET | Freq: Two times a day (BID) | ORAL | Status: DC
  Administered 2016-10-27 – 2016-10-30 (×7): 6.25 mg via ORAL
  Filled 2016-10-27 (×7): qty 1

## 2016-10-27 NOTE — Progress Notes (Signed)
[place] TEAM 1 - Stepdown/ICU TEAM  Cammie McgeeHarold Elgin  QQV:[id number] DOB: Jan 05, 1971 DOA: 10/22/2016 PCP: No PCP Per Patient    Brief Narrative:  45 y.o. male with history of HTN, systolic CHF (EF 33-29%35-40% Jan 2016), CAD w/ MI, and HLD who presented with complaints of intermittent shortness of breath over 3-4 weeks. Symptoms usually worsened with exertion and when laying flat. He noted a baseline weight of 290 pounds.  In the ED his weight was noted to be 308.   Subjective: The patient is resting comfortably in bed.  He is alert and oriented 4.  He denies chest pain nausea vomiting abdominal pain fevers or chills.  He reports only minimal shortness of breath.  Assessment & Plan:  Acute on chronic systolic CHF EF down to 10% on echo this admission - RHC with markedly elevated right and left heart filling pressures and pulmonary venous hypertension - CHF Team directing care - milrinone has now been initiated   [employer]iled Weights   10/24/16 0609 10/26/16 0500 10/27/16 0450  Weight: 134.4 kg (296 lb 6.4 oz) 129.4 kg (285 lb 3.2 oz) 128.5 kg (283 lb 6.4 oz)    CKD Stage III crt appears stable for now   Recent Labs Lab 10/23/16 1249 10/24/16 0534 10/25/16 0532 10/26/16 0257 10/27/16 0433  CREATININE 1.55* 1.74* 1.67* 1.77* 1.56*    CAD Cath 10/27 noted severe multivessel CAD - DES placed in distal OM2 and prox circ - per Cardiology "LCx system revascularized, has significant distal RCA/PDA disease.  Would favor revascularization once he is diuresed." - plan is for eventual PCI to distal RCA/PDA  DM Well controlled at this time - no change in tx plan today  HTN BP well controlled   Suspected OSA For outpatient evaluation once CHF compensated/controlled  DVT prophylaxis: SQ heparin  Code Status: FULL CODE Family Communication: no family present at time of exam  Disposition Plan: per Cardiology   [facility]   Procedures: 10/26 TTE - EF 10% - diffuse  hypokinesis  10/27 - cardiac cath - DES x2  Antimicrobials:  none  Objective: Blood pressure 126/81, pulse 70, temperature 98 F (36.7 C), temperature source Oral, resp. rate 14, height 6\' 6"  (1.981 m), weight 128.5 kg (283 lb 6.4 oz), SpO2 96 %.  Intake/Output Summary (Last 24 hours) at 10/27/16 0921 Last data filed at 10/27/16 0600  Gross per 24 hour  Intake            239.8 ml  Output             4100 ml  Net          -3860.2 ml   Filed Weights   10/24/16 0609 10/26/16 0500 10/27/16 0450  Weight: 134.4 kg (296 lb 6.4 oz) 129.4 kg (285 lb 3.2 oz) 128.5 kg (283 lb 6.4 oz)    Examination: General: No acute respiratory distress at rest  Lungs: Clear to auscultation bilaterally without wheezes or crackles Cardiovascular: Regular rate and rhythm without murmur Abdomen: Nontender, obese, soft, bowel sounds positive, no rebound, no ascites, no appreciable mass Extremities: No significant cyanosis, clubbing;  Trace edema bilateral lower extremities   CBC:  Recent Labs Lab 10/22/16 2246 10/23/16 0505 10/24/16 0534 10/26/16 0257 10/27/16 0433  WBC 10.7* 8.1 9.2 10.9* 9.6  HGB 14.1 13.0 14.2 14.0 14.4  HCT 41.2 40.2 43.8 42.9 43.0  MCV 77.7* 80.7 80.4 79.3 78.6  PLT 207 186 236 235 228   Basic Metabolic Panel:  Recent Labs Lab 10/23/16 1249 10/24/16 0534 10/25/16 0532 10/26/16 0257 10/27/16 0433  NA 138 137 139 137 136  K 4.0 4.0 4.2 3.7 3.6  CL 105 101 101 100* 100*  CO2 24 30 27 28 27   GLUCOSE 113* 141* 148* 214* 236*  BUN 35* 33* 36* 32* 30*  CREATININE 1.55* 1.74* 1.67* 1.77* 1.56*  CALCIUM 8.9 9.0 9.2 8.9 9.3   GFR: Estimated Creatinine Clearance (by C-G formula based on SCr of 1.56 mg/dL (H)) Male: 81.1 mL/min Male: 92.7 mL/min  Liver Function Tests:  Recent Labs Lab 10/22/16 2246  AST 30  ALT 35  ALKPHOS 38  BILITOT 1.1  PROT 6.9  ALBUMIN 3.9    Recent Labs Lab 10/22/16 2246  LIPASE 34    Coagulation Profile:  Recent Labs Lab  10/23/16 0505  INR 1.26    Cardiac Enzymes:  Recent Labs Lab 10/22/16 2246 10/23/16 0117 10/23/16 0809 10/23/16 1249 10/23/16 2009  TROPONINI 0.06* 0.06* 0.05* 0.05* 0.05*    HbA1C: Hgb A1c MFr Bld  Date/Time Value Ref Range Status  03/02/2015 04:21 PM 8.0 (H) <5.7 % Final    Comment:                                                                           According to the ADA Clinical Practice Recommendations for 2011, when HbA1c is used as a screening test:     >=6.5%   Diagnostic of Diabetes Mellitus            (if abnormal result is confirmed)   5.7-6.4%   Increased risk of developing Diabetes Mellitus   References:Diagnosis and Classification of Diabetes Mellitus,Diabetes Care,2011,34(Suppl 1):S62-S69 and Standards of Medical Care in         Diabetes - 2011,Diabetes Care,2011,34 (Suppl 1):S11-S61.     06/14/2013 08:26 AM 11.4 (H) 4.6 - 6.5 % Final    Comment:    Glycemic Control Guidelines for People with Diabetes:Non Diabetic:  <6%Goal of Therapy: <7%Additional Action Suggested:  >8%     CBG:  Recent Labs Lab 10/25/16 1820 10/26/16 0753 10/26/16 1634 10/26/16 2122 10/27/16 0838  GLUCAP 112* 138* 162* 156* 129*    Recent Results (from the past 240 hour(s))  MRSA PCR Screening     Status: None   Collection Time: 10/25/16  3:50 PM  Result Value Ref Range Status   MRSA by PCR NEGATIVE NEGATIVE Final    Comment:        The GeneXpert MRSA Assay (FDA approved for NASAL specimens only), is one component of a comprehensive MRSA colonization surveillance program. It is not intended to diagnose MRSA infection nor to guide or monitor treatment for MRSA infections.      Scheduled Meds: . aspirin  81 mg Oral Daily  . atorvastatin  40 mg Oral q1800  . carvedilol  6.25 mg Oral BID  . digoxin  0.125 mg Oral Daily  . furosemide  40 mg Oral Daily  . guaiFENesin  600 mg Oral BID  . heparin  5,000 Units Subcutaneous Q8H  . insulin aspart  0-9 Units  Subcutaneous TID WC  . isosorbide mononitrate  30 mg Oral Daily  . losartan  25 mg Oral  Daily  . pantoprazole  40 mg Oral Daily  . sodium chloride flush  10-40 mL Intracatheter Q12H  . spironolactone  50 mg Oral Daily  . ticagrelor  90 mg Oral BID     LOS: 4 days   Lonia BloodJeffrey T. Khari Lett, MD [facility] Office  [phone] Pager - Text Page per Amion as per below:  On-Call/Text Page:      [email or web address]      password [email or web address]  If 7PM-7AM, please contact night-coverage [email or web address] Password [email or web address] 10/27/2016, 9:21 AM

## 2016-10-27 NOTE — Progress Notes (Signed)
Patient ID: [name], male   DOB: August 27, 1971, 45 y.o.   MRN: [id number]   SUBJECTIVE: Patient diuresed very well again yesterday.  PICC placed, co-ox 45%.  Started on milrinone 0.25, co-ox 63% this morning.  CVP today about 8.    LHC/RHC (10/25/16): Subtotal occlusion LCx/OM2 => DES x 2.  80% distal RCA, 90% ostial PDA => not addressed RA mean 20 PA 73/36 PCWP mean 28 CI 1.5 Fick CI 1.74 Thermo  Echo (10/17): EF 10%, severe MR likely functional  Scheduled Meds: . aspirin  81 mg Oral Daily  . atorvastatin  40 mg Oral q1800  . carvedilol  6.25 mg Oral BID  . digoxin  0.125 mg Oral Daily  . furosemide  40 mg Oral Daily  . guaiFENesin  600 mg Oral BID  . heparin  5,000 Units Subcutaneous Q8H  . insulin aspart  0-9 Units Subcutaneous TID WC  . isosorbide mononitrate  30 mg Oral Daily  . losartan  25 mg Oral Daily  . pantoprazole  40 mg Oral Daily  . sodium chloride flush  10-40 mL Intracatheter Q12H  . spironolactone  50 mg Oral Daily  . ticagrelor  90 mg Oral BID   Continuous Infusions: . milrinone 0.25 mcg/kg/min (10/27/16 0700)   PRN Meds:.acetaminophen, nitroGLYCERIN, ondansetron (ZOFRAN) IV, sodium chloride flush    Vitals:   10/26/16 2000 10/27/16 0000 10/27/16 0400 10/27/16 0450  BP: 110/74 114/76 126/81   Pulse: 70 67 70   Resp: 14 15 14    Temp: 97.9 F (36.6 C) 98.3 F (36.8 C) 98 F (36.7 C)   TempSrc: Oral Oral Oral   SpO2: 96% 94% 96%   Weight:    283 lb 6.4 oz (128.5 kg)  Height:        Intake/Output Summary (Last 24 hours) at 10/27/16 0810 Last data filed at 10/27/16 0600  Gross per 24 hour  Intake            239.8 ml  Output             4100 ml  Net          -3860.2 ml    LABS: Basic Metabolic Panel:  Recent Labs  30/86/5710/28/17 0257 10/27/16 0433  NA 137 136  K 3.7 3.6  CL 100* 100*  CO2 28 27  GLUCOSE 214* 236*  BUN 32* 30*  CREATININE 1.77* 1.56*  CALCIUM 8.9 9.3   Liver Function Tests: No results for input(s): AST, ALT,  ALKPHOS, BILITOT, PROT, ALBUMIN in the last 72 hours. No results for input(s): LIPASE, AMYLASE in the last 72 hours. CBC:  Recent Labs  10/26/16 0257 10/27/16 0433  WBC 10.9* 9.6  HGB 14.0 14.4  HCT 42.9 43.0  MCV 79.3 78.6  PLT 235 228   Cardiac Enzymes: No results for input(s): CKTOTAL, CKMB, CKMBINDEX, TROPONINI in the last 72 hours. BNP: Invalid input(s): POCBNP D-Dimer: No results for input(s): DDIMER in the last 72 hours. Hemoglobin A1C: No results for input(s): HGBA1C in the last 72 hours. Fasting Lipid Panel: No results for input(s): CHOL, HDL, LDLCALC, TRIG, CHOLHDL, LDLDIRECT in the last 72 hours. Thyroid Function Tests: No results for input(s): TSH, T4TOTAL, T3FREE, THYROIDAB in the last 72 hours.  Invalid input(s): FREET3 Anemia Panel: No results for input(s): VITAMINB12, FOLATE, FERRITIN, TIBC, IRON, RETICCTPCT in the last 72 hours.  RADIOLOGY: Dg Chest 2 View  Result Date: 10/22/2016 CLINICAL DATA:  Cough, shortness of breath for 2 weeks. Near syncope tonight.  History of diabetes, CHF. EXAM: CHEST  2 VIEW COMPARISON:  None. FINDINGS: Cardiac silhouette is moderately enlarged, mediastinal silhouette is nonsuspicious. Pulmonary vascular congestion without pleural effusion or focal consolidation. No pneumothorax. Soft tissue planes and included osseous structure nonsuspicious. IMPRESSION: Stable cardiomegaly and pulmonary vascular congestion. Electronically Signed   By: Awilda Metroourtnay  Bloomer M.D.   On: 10/22/2016 23:29    PHYSICAL EXAM General: NAD Neck: Thick, JVP difficult, no thyromegaly or thyroid nodule.  Lungs: Clear to auscultation bilaterally with normal respiratory effort. CV: Heart regular S1/S2, no S3/S4, 2/6 HSM apex.  No peripheral edema.  Abdomen: Soft, nontender, no hepatosplenomegaly, no distention.  Neurologic: Alert and oriented x 3.  Psych: Normal affect. Extremities: No clubbing or cyanosis.   TELEMETRY: Reviewed telemetry pt in  NSR  ASSESSMENT AND PLAN: 45 yo with history of DM, CAD, ischemic cardiomyopathy presented with acute on chronic systolic CHF. 1. Acute on chronic systolic CHF: Ischemic cardiomyopathy.  EF down to 10% on echo this admission.  RHC with markedly elevated right and left heart filling pressures and pulmonary venous hypertension.   Cardiac output was low.  PICC placed, co-ox 45% yesterday but improved to 63% this morning on milrinone gtt 0.25. He diuresed well yesterday on IV Lasix, CVP now 8. Creatinine lower today at 1.56.  - Continue milrinone 0.25 today, begin wean likely tomorrow.   - Transition to po Lasix 40 mg daily.  - With low output, decrease Coreg to 6.25 mg bid.  - Continue spironolactone.   - Would like to start Entresto eventually => add losartan 25 mg daily today, [facility] if BP and creatinine remain stable.  - Continue digoxin.  - Will need to follow closely, hopefully will have improvement in function with meds and revascularization.  - QRS not wide enough to benefit from CRT.  2. CAD: LCx system revascularized, has significant distal RCA/PDA disease.  Would favor revascularization. - Plan PCI to distal RCA/PDA => will put on board tomorrow, d/w Dr. Herbie BaltimoreHarding.  Would go forward with PCI if renal function remains stable.  - Continue ASA and ticagrelor.  - Added statin.  3. Suspect OSA: Will need sleep study as outpatient.  4. CKD: Stage III.  Creatinine down to 1.56 today.    [name] 10/27/2016 8:10 AM

## 2016-10-27 NOTE — Progress Notes (Signed)
Patient has been up in his room throughout the day. Ambulated independently, has been up to the chair as well as up to the bathroom. Vital signs are stable, no complaint of pain. His biggest concern is being able to afford the medication prescribed once he leaves the hospital. Patient is suppose to go for heart cath tomorrow if the schedule allows. No questions or concerns at this time, call bell is in reach, will continue to monitor.

## 2016-10-28 ENCOUNTER — Encounter (HOSPITAL_COMMUNITY)
Admission: EM | Disposition: A | Discharge status: Home / Self Care | Payer: Self-pay | Source: Home / Self Care | Attending: Internal Medicine

## 2016-10-28 ENCOUNTER — Encounter (HOSPITAL_COMMUNITY): Payer: Self-pay | Admitting: Cardiology

## 2016-10-28 DIAGNOSIS — I5021 Acute systolic (congestive) heart failure: Secondary | ICD-10-CM | POA: Diagnosis present

## 2016-10-28 HISTORY — DX: Acute systolic (congestive) heart failure: I50.21

## 2016-10-28 HISTORY — PX: CARDIAC CATHETERIZATION: SHX172

## 2016-10-28 LAB — LIPID PANEL
CHOL/HDL RATIO: 5 ratio
CHOLESTEROL: 159 mg/dL (ref 0–200)
HDL: 32 mg/dL — ABNORMAL LOW (ref >40)
LDL Cholesterol: 100 mg/dL — ABNORMAL HIGH (ref 0–99)
TRIGLYCERIDES: 134 mg/dL (ref <150)
VLDL: 27 mg/dL (ref 0–40)

## 2016-10-28 LAB — CBC
HCT: 46.4 % (ref 39.0–52.0)
Hemoglobin: 15.4 g/dL (ref 13.0–17.0)
MCH: 25.9 pg — AB (ref 26.0–34.0)
MCHC: 33.2 g/dL (ref 30.0–36.0)
MCV: 78.1 fL (ref 78.0–100.0)
PLATELETS: 258 10*3/uL (ref 150–400)
RBC: 5.94 MIL/uL — AB (ref 4.22–5.81)
RDW: 13.8 % (ref 11.5–15.5)
WBC: 9.2 10*3/uL (ref 4.0–10.5)

## 2016-10-28 LAB — BASIC METABOLIC PANEL
Anion gap: 9 (ref 5–15)
BUN: 28 mg/dL — ABNORMAL HIGH (ref 6–20)
CALCIUM: 9.2 mg/dL (ref 8.9–10.3)
CO2: 26 mmol/L (ref 22–32)
CREATININE: 1.56 mg/dL — AB (ref 0.61–1.24)
Chloride: 101 mmol/L (ref 101–111)
GFR, EST NON AFRICAN AMERICAN: 53 mL/min — AB (ref >60)
Glucose, Bld: 161 mg/dL — ABNORMAL HIGH (ref 65–99)
Potassium: 4 mmol/L (ref 3.5–5.1)
Sodium: 136 mmol/L (ref 135–145)

## 2016-10-28 LAB — COOXEMETRY PANEL
Carboxyhemoglobin: 1.5 % (ref 0.5–1.5)
METHEMOGLOBIN: 0.8 % (ref 0.0–1.5)
O2 Saturation: 60.9 %
TOTAL HEMOGLOBIN: 14.9 g/dL (ref 12.0–16.0)

## 2016-10-28 LAB — GLUCOSE, CAPILLARY
GLUCOSE-CAPILLARY: 176 mg/dL — AB (ref 65–99)
GLUCOSE-CAPILLARY: 158 mg/dL — AB (ref 65–99)
GLUCOSE-CAPILLARY: 195 mg/dL — AB (ref 65–99)
Glucose-Capillary: 144 mg/dL — ABNORMAL HIGH (ref 65–99)
Glucose-Capillary: 179 mg/dL — ABNORMAL HIGH (ref 65–99)

## 2016-10-28 LAB — POCT ACTIVATED CLOTTING TIME: ACTIVATED CLOTTING TIME: 698 s

## 2016-10-28 SURGERY — CORONARY STENT INTERVENTION
Anesthesia: LOCAL

## 2016-10-28 MED ORDER — HEPARIN SODIUM (PORCINE) 1000 UNIT/ML IJ SOLN
INTRAMUSCULAR | Status: DC | PRN
  Administered 2016-10-28: 11000 [IU] via INTRAVENOUS

## 2016-10-28 MED ORDER — MIDAZOLAM HCL 2 MG/2ML IJ SOLN
INTRAMUSCULAR | Status: DC | PRN
Start: 2016-10-28 — End: 2016-10-28
  Administered 2016-10-28: 2 mg via INTRAVENOUS

## 2016-10-28 MED ORDER — HEPARIN SODIUM (PORCINE) 1000 UNIT/ML IJ SOLN
INTRAMUSCULAR | Status: AC
  Filled 2016-10-28: qty 1

## 2016-10-28 MED ORDER — LIDOCAINE HCL (PF) 1 % IJ SOLN
INTRAMUSCULAR | Status: DC | PRN
  Administered 2016-10-28: 4 mL via INTRADERMAL

## 2016-10-28 MED ORDER — SODIUM CHLORIDE 0.9% FLUSH: 3.0000 mL | INTRAVENOUS | Status: DC | PRN

## 2016-10-28 MED ORDER — HEPARIN (PORCINE) IN NACL 2-0.9 UNIT/ML-% IJ SOLN
INTRAMUSCULAR | Status: AC
Start: 2016-10-28 — End: 2016-10-28
  Filled 2016-10-28: qty 500

## 2016-10-28 MED ORDER — LIDOCAINE HCL (PF) 1 % IJ SOLN
INTRAMUSCULAR | Status: AC
  Filled 2016-10-28: qty 30

## 2016-10-28 MED ORDER — IOPAMIDOL (ISOVUE-370) INJECTION 76%
INTRAVENOUS | Status: AC
  Filled 2016-10-28: qty 125

## 2016-10-28 MED ORDER — HEPARIN (PORCINE) IN NACL 2-0.9 UNIT/ML-% IJ SOLN
INTRAMUSCULAR | Status: DC | PRN
  Administered 2016-10-28: 1000 mL via INTRA_ARTERIAL

## 2016-10-28 MED ORDER — MIDAZOLAM HCL 2 MG/2ML IJ SOLN
INTRAMUSCULAR | Status: AC
  Filled 2016-10-28: qty 2

## 2016-10-28 MED ORDER — VERAPAMIL HCL 2.5 MG/ML IV SOLN
INTRAVENOUS | Status: DC | PRN
  Administered 2016-10-28: 11:00:00 via INTRA_ARTERIAL

## 2016-10-28 MED ORDER — IOPAMIDOL (ISOVUE-370) INJECTION 76%
INTRAVENOUS | Status: DC | PRN
  Administered 2016-10-28: 115 mL via INTRAVENOUS

## 2016-10-28 MED ORDER — SODIUM CHLORIDE 0.9 % IV SOLN: 250.0000 mL | INTRAVENOUS | Status: DC | PRN

## 2016-10-28 MED ORDER — SODIUM CHLORIDE 0.9% FLUSH
3.0000 mL | Freq: Two times a day (BID) | INTRAVENOUS | Status: DC
  Administered 2016-10-28 – 2016-10-29 (×4): 3 mL via INTRAVENOUS

## 2016-10-28 MED ORDER — HEPARIN (PORCINE) IN NACL 2-0.9 UNIT/ML-% IJ SOLN
INTRAMUSCULAR | Status: AC
  Filled 2016-10-28: qty 500

## 2016-10-28 MED ORDER — ASPIRIN 81 MG PO CHEW: 81.0000 mg | CHEWABLE_TABLET | ORAL | Status: DC

## 2016-10-28 MED ORDER — SODIUM CHLORIDE 0.9 % IV SOLN: INTRAVENOUS | Status: DC

## 2016-10-28 MED ORDER — NITROGLYCERIN 1 MG/10 ML FOR IR/CATH LAB
INTRA_ARTERIAL | Status: AC
  Filled 2016-10-28: qty 10

## 2016-10-28 MED ORDER — SODIUM CHLORIDE 0.9% FLUSH: 3.0000 mL | Freq: Two times a day (BID) | INTRAVENOUS | Status: DC

## 2016-10-28 MED ORDER — FENTANYL CITRATE (PF) 100 MCG/2ML IJ SOLN
INTRAMUSCULAR | Status: AC
  Filled 2016-10-28: qty 2

## 2016-10-28 MED ORDER — FENTANYL CITRATE (PF) 100 MCG/2ML IJ SOLN
INTRAMUSCULAR | Status: DC | PRN
  Administered 2016-10-28: 50 ug via INTRAVENOUS

## 2016-10-28 MED ORDER — VERAPAMIL HCL 2.5 MG/ML IV SOLN
INTRAVENOUS | Status: AC
  Filled 2016-10-28: qty 2

## 2016-10-28 MED ORDER — SODIUM CHLORIDE 0.9 % IV SOLN
INTRAVENOUS | Status: AC
  Administered 2016-10-28: 13:00:00 via INTRAVENOUS

## 2016-10-28 SURGICAL SUPPLY — 15 items
BALLN EMERGE MR 2.0X20 (BALLOONS) ×2
BALLN ~~LOC~~ EMERGE MR 2.25X15 (BALLOONS) ×2
BALLOON EMERGE MR 2.0X20 (BALLOONS) ×1 IMPLANT
BALLOON ~~LOC~~ EMERGE MR 2.25X15 (BALLOONS) ×1 IMPLANT
DEVICE RAD COMP TR BAND LRG (VASCULAR PRODUCTS) ×2 IMPLANT
GLIDESHEATH SLEND SS 6F .021 (SHEATH) ×2 IMPLANT
GUIDE CATH RUNWAY 6FR AL 75 (CATHETERS) ×2 IMPLANT
KIT ENCORE 26 ADVANTAGE (KITS) ×2 IMPLANT
KIT HEART LEFT (KITS) ×2 IMPLANT
PACK CARDIAC CATHETERIZATION (CUSTOM PROCEDURE TRAY) ×2 IMPLANT
STENT PROMUS PREM MR 2.25X24 (Permanent Stent) ×2 IMPLANT
TRANSDUCER W/STOPCOCK (MISCELLANEOUS) ×2 IMPLANT
TUBING CIL FLEX 10 FLL-RA (TUBING) ×2 IMPLANT
WIRE MARVEL STR TIP 190CM (WIRE) ×2 IMPLANT
WIRE SAFE-T 1.5MM-J .035X260CM (WIRE) ×2 IMPLANT

## 2016-10-28 NOTE — Interval H&P Note (Signed)
History and Physical Interval Note:  10/28/2016 10:36 AM  [name]  has presented today for cardiac cath with the diagnosis of CAD/unstable angina.  The various methods of treatment have been discussed with the patient and family. After consideration of risks, benefits and other options for treatment, the patient has consented to  Procedure(s): Coronary Stent Intervention (N/A) as a surgical intervention .  The patient's history has been reviewed, patient examined, no change in status, stable for surgery.  I have reviewed the patient's chart and labs.  Questions were answered to the patient's satisfaction.    Cath Lab Visit (complete for each Cath Lab visit)  Clinical Evaluation Leading to the Procedure:   ACS: Yes.    Non-ACS:    Anginal Classification: CCS III  Anti-ischemic medical therapy: Maximal Therapy (2 or more classes of medications)  Non-Invasive Test Results: No non-invasive testing performed  Prior CABG: No previous CABG         Verne Carrowhristopher Jessa Stinson

## 2016-10-28 NOTE — Progress Notes (Signed)
[name] made aware of pt's low BP's since returning from cath lab. Pt is mentating and not symptomatic. Will continue to monitor.

## 2016-10-28 NOTE — Progress Notes (Signed)
Patient ID: [name], male   DOB: 03/16/1971, 45 y.o.   MRN: [id number]   SUBJECTIVE:  Patient diuresed very well again yesterday.  PICC placed, co-ox 45%.  Started on milrinone 0.25, co-ox 61% this morning.  CVP 4-5.   Denies SOB/CP    LHC/RHC (10/25/16): Subtotal occlusion LCx/OM2 => DES x 2.  80% distal RCA, 90% ostial PDA => not addressed RA mean 20 PA 73/36 PCWP mean 28 CI 1.5 Fick CI 1.74 Thermo  Echo (10/17): EF 10%, severe MR likely functional  Scheduled Meds: . aspirin  81 mg Oral Daily  . atorvastatin  40 mg Oral q1800  . carvedilol  6.25 mg Oral BID  . digoxin  0.125 mg Oral Daily  . furosemide  40 mg Oral Daily  . guaiFENesin  600 mg Oral BID  . heparin  5,000 Units Subcutaneous Q8H  . insulin aspart  0-9 Units Subcutaneous TID WC  . isosorbide mononitrate  30 mg Oral Daily  . losartan  25 mg Oral Daily  . pantoprazole  40 mg Oral Daily  . sodium chloride flush  10-40 mL Intracatheter Q12H  . sodium chloride flush  3 mL Intravenous Q12H  . spironolactone  50 mg Oral Daily  . ticagrelor  90 mg Oral BID   Continuous Infusions: . sodium chloride 10 mL/hr at 10/28/16 0600  . milrinone 0.25 mcg/kg/min (10/28/16 0018)   PRN Meds:.sodium chloride, acetaminophen, nitroGLYCERIN, ondansetron (ZOFRAN) IV, sodium chloride flush, sodium chloride flush    Vitals:   10/27/16 1957 10/28/16 0019 10/28/16 0500 10/28/16 0503  BP: 112/68 103/67  133/88  Pulse:      Resp: 18 18  18   Temp: 97.5 F (36.4 C) 97.8 F (36.6 C)  97.5 F (36.4 C)  TempSrc: Oral Oral  Oral  SpO2:  97%  96%  Weight:   283 lb 11.7 oz (128.7 kg)   Height:        Intake/Output Summary (Last 24 hours) at 10/28/16 [id number] Last data filed at 10/28/16 0600  Gross per 24 hour  Intake           1432.8 ml  Output             1250 ml  Net            182.8 ml    LABS: Basic Metabolic Panel:  Recent Labs  11/91/4710/29/17 0433 10/28/16 0533  NA 136 136  K 3.6 4.0  CL 100* 101  CO2 27 26    GLUCOSE 236* 161*  BUN 30* 28*  CREATININE 1.56* 1.56*  CALCIUM 9.3 9.2   Liver Function Tests: No results for input(s): AST, ALT, ALKPHOS, BILITOT, PROT, ALBUMIN in the last 72 hours. No results for input(s): LIPASE, AMYLASE in the last 72 hours. CBC:  Recent Labs  10/27/16 0433 10/28/16 0533  WBC 9.6 9.2  HGB 14.4 15.4  HCT 43.0 46.4  MCV 78.6 78.1  PLT 228 258   Cardiac Enzymes: No results for input(s): CKTOTAL, CKMB, CKMBINDEX, TROPONINI in the last 72 hours. BNP: Invalid input(s): POCBNP D-Dimer: No results for input(s): DDIMER in the last 72 hours. Hemoglobin A1C: No results for input(s): HGBA1C in the last 72 hours. Fasting Lipid Panel: No results for input(s): CHOL, HDL, LDLCALC, TRIG, CHOLHDL, LDLDIRECT in the last 72 hours. Thyroid Function Tests: No results for input(s): TSH, T4TOTAL, T3FREE, THYROIDAB in the last 72 hours.  Invalid input(s): FREET3 Anemia Panel: No results for input(s): VITAMINB12, FOLATE, FERRITIN, TIBC, IRON,  RETICCTPCT in the last 72 hours.  RADIOLOGY: Dg Chest 2 View  Result Date: 10/22/2016 CLINICAL DATA:  Cough, shortness of breath for 2 weeks. Near syncope tonight. History of diabetes, CHF. EXAM: CHEST  2 VIEW COMPARISON:  None. FINDINGS: Cardiac silhouette is moderately enlarged, mediastinal silhouette is nonsuspicious. Pulmonary vascular congestion without pleural effusion or focal consolidation. No pneumothorax. Soft tissue planes and included osseous structure nonsuspicious. IMPRESSION: Stable cardiomegaly and pulmonary vascular congestion. Electronically Signed   By: Courtnay  Bloomer M.D.   On: 10/22/2016 23:29    PHYSICAL EXAM CVP 4-5  General: NAD Neck: Thick, JVP difficult, no thyromegaly or thyroid nodule.  Lungs: Clear  CV: Heart regular S1/S2, no S3/S4, 2/6 HSM apex.  No peripheral edema.  Abdomen: Soft, nontender, no hepatosplenomegaly, no distention.  Neurologic: Alert and oriented x 3.  Psych: Normal  affect. Extremities: No clubbing or cyanosis. RUE PICC double lumen  TELEMETRY: Reviewed telemetry pt in NSR  ASSESSMENT AND PLAN: 44 yo with history of DM, CAD, ischemic cardiomyopathy presented with acute on chronic systolic CHF. 1. Acute on chronic systolic CHF: Ischemic cardiomyopathy.  EF down to 10% on echo this admission.  RHC with markedly elevated right and left heart filling pressures and pulmonary venous hypertension.   Cardiac output was low.  PICC placed, with initial co-ox 45%. Todays CO-OX is 61%.    CVP 4-5. Creatinine unchanged at 1.56.  - Cut back milrinone 0.125 mcg.    - Continue po Lasix 40 mg daily.  - Continue Coreg to 6.25 mg bid.  - Continue spironolactone 50 mg daily. .   - Would like to start Entresto eventually => continue losartan 25 mg daily today.  -Tomorrow transition over to Entresto if BP and creatinine remain stable.  - Continue digoxin 0.125 mg daily. Check dig level later this week.  - Will need to follow closely, hopefully will have improvement in function with meds and revascularization.  - QRS not wide enough to benefit from CRT.  2. CAD: LCx system revascularized, has significant distal RCA/PDA disease.  Would favor revascularization. - Plan PCI to distal RCA/PDA today.  Would go forward with PCI if renal function remains stable.  - Continue ASA and ticagrelor.  - Continue statin. Check lipid panel today.  3. Suspect OSA: Will need sleep study as outpatient.  4. CKD: Stage III.  Creatinine down to 1.56 today.  Unchanged from yesterday.  5. DMII- On sliding scale.   Will need to provide HF meds prior to d/c Amy Clegg NP-C  10/28/2016 7:12 AM  Patient seen with NP, agree with the above note.  CVP down to 4-5, now on po Lasix.  Creatinine stable.  Co-ox 61%.  Can start weaning down milrinone, 0.125 for today.  He will go for PCI to distal RCA/PDA today, discussed with Dr. McAlhany.   [name] 10/28/2016 7:33 AM  

## 2016-10-28 NOTE — H&P (View-Only) (Signed)
Patient ID: [name], male   DOB: 03/16/1971, 45 y.o.   MRN: [id number]   SUBJECTIVE:  Patient diuresed very well again yesterday.  PICC placed, co-ox 45%.  Started on milrinone 0.25, co-ox 61% this morning.  CVP 4-5.   Denies SOB/CP    LHC/RHC (10/25/16): Subtotal occlusion LCx/OM2 => DES x 2.  80% distal RCA, 90% ostial PDA => not addressed RA mean 20 PA 73/36 PCWP mean 28 CI 1.5 Fick CI 1.74 Thermo  Echo (10/17): EF 10%, severe MR likely functional  Scheduled Meds: . aspirin  81 mg Oral Daily  . atorvastatin  40 mg Oral q1800  . carvedilol  6.25 mg Oral BID  . digoxin  0.125 mg Oral Daily  . furosemide  40 mg Oral Daily  . guaiFENesin  600 mg Oral BID  . heparin  5,000 Units Subcutaneous Q8H  . insulin aspart  0-9 Units Subcutaneous TID WC  . isosorbide mononitrate  30 mg Oral Daily  . losartan  25 mg Oral Daily  . pantoprazole  40 mg Oral Daily  . sodium chloride flush  10-40 mL Intracatheter Q12H  . sodium chloride flush  3 mL Intravenous Q12H  . spironolactone  50 mg Oral Daily  . ticagrelor  90 mg Oral BID   Continuous Infusions: . sodium chloride 10 mL/hr at 10/28/16 0600  . milrinone 0.25 mcg/kg/min (10/28/16 0018)   PRN Meds:.sodium chloride, acetaminophen, nitroGLYCERIN, ondansetron (ZOFRAN) IV, sodium chloride flush, sodium chloride flush    Vitals:   10/27/16 1957 10/28/16 0019 10/28/16 0500 10/28/16 0503  BP: 112/68 103/67  133/88  Pulse:      Resp: 18 18  18   Temp: 97.5 F (36.4 C) 97.8 F (36.6 C)  97.5 F (36.4 C)  TempSrc: Oral Oral  Oral  SpO2:  97%  96%  Weight:   283 lb 11.7 oz (128.7 kg)   Height:        Intake/Output Summary (Last 24 hours) at 10/28/16 [id number] Last data filed at 10/28/16 0600  Gross per 24 hour  Intake           1432.8 ml  Output             1250 ml  Net            182.8 ml    LABS: Basic Metabolic Panel:  Recent Labs  11/91/4710/29/17 0433 10/28/16 0533  NA 136 136  K 3.6 4.0  CL 100* 101  CO2 27 26    GLUCOSE 236* 161*  BUN 30* 28*  CREATININE 1.56* 1.56*  CALCIUM 9.3 9.2   Liver Function Tests: No results for input(s): AST, ALT, ALKPHOS, BILITOT, PROT, ALBUMIN in the last 72 hours. No results for input(s): LIPASE, AMYLASE in the last 72 hours. CBC:  Recent Labs  10/27/16 0433 10/28/16 0533  WBC 9.6 9.2  HGB 14.4 15.4  HCT 43.0 46.4  MCV 78.6 78.1  PLT 228 258   Cardiac Enzymes: No results for input(s): CKTOTAL, CKMB, CKMBINDEX, TROPONINI in the last 72 hours. BNP: Invalid input(s): POCBNP D-Dimer: No results for input(s): DDIMER in the last 72 hours. Hemoglobin A1C: No results for input(s): HGBA1C in the last 72 hours. Fasting Lipid Panel: No results for input(s): CHOL, HDL, LDLCALC, TRIG, CHOLHDL, LDLDIRECT in the last 72 hours. Thyroid Function Tests: No results for input(s): TSH, T4TOTAL, T3FREE, THYROIDAB in the last 72 hours.  Invalid input(s): FREET3 Anemia Panel: No results for input(s): VITAMINB12, FOLATE, FERRITIN, TIBC, IRON,  RETICCTPCT in the last 72 hours.  RADIOLOGY: Dg Chest 2 View  Result Date: 10/22/2016 CLINICAL DATA:  Cough, shortness of breath for 2 weeks. Near syncope tonight. History of diabetes, CHF. EXAM: CHEST  2 VIEW COMPARISON:  None. FINDINGS: Cardiac silhouette is moderately enlarged, mediastinal silhouette is nonsuspicious. Pulmonary vascular congestion without pleural effusion or focal consolidation. No pneumothorax. Soft tissue planes and included osseous structure nonsuspicious. IMPRESSION: Stable cardiomegaly and pulmonary vascular congestion. Electronically Signed   By: Awilda Metroourtnay  Bloomer M.D.   On: 10/22/2016 23:29    PHYSICAL EXAM CVP 4-5  General: NAD Neck: Thick, JVP difficult, no thyromegaly or thyroid nodule.  Lungs: Clear  CV: Heart regular S1/S2, no S3/S4, 2/6 HSM apex.  No peripheral edema.  Abdomen: Soft, nontender, no hepatosplenomegaly, no distention.  Neurologic: Alert and oriented x 3.  Psych: Normal  affect. Extremities: No clubbing or cyanosis. RUE PICC double lumen  TELEMETRY: Reviewed telemetry pt in NSR  ASSESSMENT AND PLAN: 45 yo with history of DM, CAD, ischemic cardiomyopathy presented with acute on chronic systolic CHF. 1. Acute on chronic systolic CHF: Ischemic cardiomyopathy.  EF down to 10% on echo this admission.  RHC with markedly elevated right and left heart filling pressures and pulmonary venous hypertension.   Cardiac output was low.  PICC placed, with initial co-ox 45%. Todays CO-OX is 61%.    CVP 4-5. Creatinine unchanged at 1.56.  - Cut back milrinone 0.125 mcg.    - Continue po Lasix 40 mg daily.  - Continue Coreg to 6.25 mg bid.  - Continue spironolactone 50 mg daily. .   - Would like to start Entresto eventually => continue losartan 25 mg daily today.  -[facility] BP and creatinine remain stable.  - Continue digoxin 0.125 mg daily. Check dig level later this week.  - Will need to follow closely, hopefully will have improvement in function with meds and revascularization.  - QRS not wide enough to benefit from CRT.  2. CAD: LCx system revascularized, has significant distal RCA/PDA disease.  Would favor revascularization. - Plan PCI to distal RCA/PDA today.  Would go forward with PCI if renal function remains stable.  - Continue ASA and ticagrelor.  - Continue statin. Check lipid panel today.  3. Suspect OSA: Will need sleep study as outpatient.  4. CKD: Stage III.  Creatinine down to 1.56 today.  Unchanged from yesterday.  5. DMII- On sliding scale.   Will need to provide HF meds prior to d/c Amy Clegg NP-C  10/28/2016 7:12 AM  Patient seen with NP, agree with the above note.  CVP down to 4-5, now on po Lasix.  Creatinine stable.  Co-ox 61%.  Can start weaning down milrinone, 0.125 for today.  He will go for PCI to distal RCA/PDA today, discussed with Dr. Clifton JamesMcAlhany.   [name] 10/28/2016 7:33 AM

## 2016-10-28 NOTE — Progress Notes (Signed)
TR band deflated per protocol and removed at 21:20, site is a level 0, VSS, gauze and tegaderm dressing applied. Education provided to pt regarding site care and mobility restrictions, will continue to monitor.

## 2016-10-28 NOTE — Progress Notes (Signed)
Patient transferred to Cath lab, VS remained stable during transport.  Consent in chart.  Patient states that his father is on the way to the hospital and will be arriving later.  All patient belongings (cell phone, clothing) left in room

## 2016-10-28 NOTE — Progress Notes (Signed)
Paged MD Clifton JamesMcAlhany. Pt's TR band is oozing and RN unable to successfully remove air. MD said to fill band back to original starting point of 13cc, wait 2 hours, and to begin deflation again after those 2 hours. Will continue to monitor.

## 2016-10-28 NOTE — Progress Notes (Signed)
  [place] TEAM 1 - Stepdown/ICU TEAM  All active issues are currently being addressed by the CHF team.  No exam today.  Lonia BloodJeffrey T. Haly Feher, MD [facility] Office  [phone] Pager - Text Page per Amion as per below:  On-Call/Text Page:      [email or web address]      password [email or web address]  If 7PM-7AM, please contact night-coverage [email or web address] Password [email or web address] 10/28/2016, 9:38 AM

## 2016-10-29 ENCOUNTER — Telehealth: Payer: Self-pay

## 2016-10-29 ENCOUNTER — Inpatient Hospital Stay: Payer: Self-pay | Admitting: Family Medicine

## 2016-10-29 LAB — GLUCOSE, CAPILLARY
GLUCOSE-CAPILLARY: 152 mg/dL — AB (ref 65–99)
GLUCOSE-CAPILLARY: 150 mg/dL — AB (ref 65–99)
Glucose-Capillary: 155 mg/dL — ABNORMAL HIGH (ref 65–99)
Glucose-Capillary: 194 mg/dL — ABNORMAL HIGH (ref 65–99)

## 2016-10-29 LAB — BASIC METABOLIC PANEL
ANION GAP: 8 (ref 5–15)
BUN: 25 mg/dL — AB (ref 6–20)
CHLORIDE: 102 mmol/L (ref 101–111)
CO2: 27 mmol/L (ref 22–32)
Calcium: 9.6 mg/dL (ref 8.9–10.3)
Creatinine, Ser: 1.65 mg/dL — ABNORMAL HIGH (ref 0.61–1.24)
GFR calc Af Amer: 58 mL/min — ABNORMAL LOW (ref >60)
GFR, EST NON AFRICAN AMERICAN: 50 mL/min — AB (ref >60)
GLUCOSE: 164 mg/dL — AB (ref 65–99)
POTASSIUM: 4.1 mmol/L (ref 3.5–5.1)
Sodium: 137 mmol/L (ref 135–145)

## 2016-10-29 LAB — CBC
HEMATOCRIT: 47.2 % (ref 39.0–52.0)
HEMOGLOBIN: 15.4 g/dL (ref 13.0–17.0)
MCH: 26.4 pg (ref 26.0–34.0)
MCHC: 32.6 g/dL (ref 30.0–36.0)
MCV: 80.8 fL (ref 78.0–100.0)
Platelets: 239 10*3/uL (ref 150–400)
RBC: 5.84 MIL/uL — ABNORMAL HIGH (ref 4.22–5.81)
RDW: 14.5 % (ref 11.5–15.5)
WBC: 9.7 10*3/uL (ref 4.0–10.5)

## 2016-10-29 LAB — COOXEMETRY PANEL
CARBOXYHEMOGLOBIN: 1.5 % (ref 0.5–1.5)
Carboxyhemoglobin: 0.9 % (ref 0.5–1.5)
METHEMOGLOBIN: 0.9 % (ref 0.0–1.5)
Methemoglobin: 0.9 % (ref 0.0–1.5)
O2 SAT: 46.9 %
O2 SAT: 55.6 %
Total hemoglobin: 15.6 g/dL (ref 12.0–16.0)
Total hemoglobin: 14.6 g/dL (ref 12.0–16.0)

## 2016-10-29 MED ORDER — SACUBITRIL-VALSARTAN 24-26 MG PO TABS
1.0000 | ORAL_TABLET | Freq: Two times a day (BID) | ORAL | Status: DC
  Administered 2016-10-29 – 2016-10-30 (×3): 1 via ORAL
  Filled 2016-10-29 (×4): qty 1

## 2016-10-29 MED ORDER — FUROSEMIDE 20 MG PO TABS
20.0000 mg | ORAL_TABLET | Freq: Every day | ORAL | Status: DC
  Administered 2016-10-29 – 2016-10-30 (×2): 20 mg via ORAL
  Filled 2016-10-29 (×2): qty 1

## 2016-10-29 MED ORDER — GUAIFENESIN-DM 100-10 MG/5ML PO SYRP
5.0000 mL | ORAL_SOLUTION | ORAL | Status: DC | PRN
  Administered 2016-10-29 – 2016-10-30 (×2): 5 mL via ORAL
  Filled 2016-10-29 (×2): qty 5

## 2016-10-29 MED FILL — Nitroglycerin IV Soln 100 MCG/ML in D5W: INTRA_ARTERIAL | Qty: 10 | Status: AC

## 2016-10-29 NOTE — Progress Notes (Signed)
PROGRESS NOTE    [name]  YSA:[id number] DOB: [birth year] DOA: 10/22/2016 PCP: No PCP Per Patient   Brief Narrative:   45 y.o. WM PMHx  HTN, Systolic CHF last EF 35-40% in 12/2014, ischemic Cardiomyopathy, MI, CAD native artery,  DM2 (diabetes mellitus, type 2) with Complications, HLD,   who presents with complaints of intermittent shortness of breath over the last 3-4 weeks. Symptoms usually worsened with exertion and improved with rest. He reports feeling as though intermittently when he lays down to sleep that he has to force himself to breathe and when he sits up he feels fine. Never he exerts himself he complains of feeling of fullness in his belly. Patient had reportedly complained of similar symptoms of shortness of breath last month for which he was evaluated by his cardiologist Dr. Antoine Poche and was restarted on spironolactone at that time. This medication had previously been started and he felt that it was messing with his eyesight. Since restarting this medication patient denies any recurrence of symptoms. He reports taking all of his medications as prescribed on a regular basis. Over the last 1-2 weeks the patient had not been routinely checking his weight at home and notes that he normally weighs around 290 pounds. Review of patient's last office note had him around 293 pounds. Associated symptoms include productive cough with greenish sputum production over the last 3-4 days and sore throat. He denies any lightheadedness, fever, chills, diarrhea, nausea, vomiting, recent travel greater than 4 hours, calf pain, or significant leg swelling.    Subjective: 10/31  A/O 4, NAD. Sitting in chair comfortably.   Assessment & Plan:   Principal Problem:   Acute systolic congestive heart failure (HCC) Active Problems:   Diabetes mellitus type 2 in obese [facility])   Essential hypertension, benign   Acute renal failure (HCC)   Ischemic cardiomyopathy   CAD   Dyspnea on exertion  Hypoxia   Acute exacerbation of CHF (congestive heart failure) (HCC)   Elevated troponin   Acute systolic CHF (congestive heart failure) (HCC)   Acute on chronic systolic CHF -EF down to 10% on echo this admission - RHC with markedly elevated right and left heart filling pressures and pulmonary venous hypertension - CHF Team directing care - milrinone has now been initiated  -Strict in and out since admission -13.2 L -Day weight Filed Weights   10/27/16 0450 10/28/16 0500 10/29/16 0309  Weight: 128.5 kg (283 lb 6.4 oz) 128.7 kg (283 lb 11.7 oz) 128.4 kg (283 lb 1.1 oz)   CAD native artery -Cath 10/27 noted severe multivessel CAD - DES placed in distal OM2 and prox circ - per Cardiology "LCx system revascularized, has significant distal RCA/PDA disease. Would favor revascularization once he is diuresed." - plan is for eventual PCI to distal RCA/PDA  HTN -BP well controlled   CKD Stage III Lab Results  Component Value Date   CREATININE 1.65 (H) 10/29/2016   CREATININE 1.56 (H) 10/28/2016   CREATININE 1.56 (H) 10/27/2016  -Cr appears stable for now   Suspected OSA -For outpatient evaluation once CHF compensated/controlled     DVT prophylaxis: Subcutaneous heparin Code Status: Full Family Communication: None Disposition Plan: Per cardiology   Consultants:  Dr. Marca Ancona [facility]  Procedures/Significant Events:  10/26 TTE - EF 10% - diffuse hypokinesis  10/27 - cardiac cath - DES x2  Cultures None  Antimicrobials: None   Devices None   LINES / TUBES:  None    Continuous Infusions: . milrinone  0.125 mcg/kg/min (10/29/16 0725)     Objective: Vitals:   10/28/16 2300 10/29/16 0000 10/29/16 0309 10/29/16 0736  BP: 113/89 125/89 124/87 (!) 107/92  Pulse: 66  73 75  Resp:    (!) 24  Temp: 97.5 F (36.4 C)  97.6 F (36.4 C) 97.4 F (36.3 C)  TempSrc: Oral  Oral Oral  SpO2: 95%  97% 97%  Weight:   128.4 kg (283 lb 1.1 oz)   Height:         Intake/Output Summary (Last 24 hours) at 10/29/16 0801 Last data filed at 10/29/16 0700  Gross per 24 hour  Intake           619.61 ml  Output             2145 ml  Net         -1525.39 ml   Filed Weights   10/27/16 0450 10/28/16 0500 10/29/16 0309  Weight: 128.5 kg (283 lb 6.4 oz) 128.7 kg (283 lb 11.7 oz) 128.4 kg (283 lb 1.1 oz)    Examination:  General: A/O 4, NAD, No acute respiratory distress Eyes: negative scleral hemorrhage, negative anisocoria, negative icterus ENT: Negative Runny nose, negative gingival bleeding, Neck:  Negative scars, masses, torticollis, lymphadenopathy, JVD Lungs: Clear to auscultation bilaterally without wheezes or crackles Cardiovascular: Regular rate and rhythm without murmur gallop or rub normal S1 and S2 Abdomen: negative abdominal pain, nondistended, positive soft, bowel sounds, no rebound, no ascites, no appreciable mass Extremities: No significant cyanosis, clubbing, or edema bilateral lower extremities Skin: Negative rashes, lesions, ulcers Psychiatric:  Negative depression, negative anxiety, negative fatigue, negative mania  Central nervous system:  Cranial nerves II through XII intact, tongue/uvula midline, all extremities muscle strength 5/5, sensation intact throughout, negative dysarthria, negative expressive aphasia, negative receptive aphasia.  .     Data Reviewed: Care during the described time interval was provided by me .  I have reviewed this patient's available data, including medical history, events of note, physical examination, and all test results as part of my evaluation. I have personally reviewed and interpreted all radiology studies.  CBC:  Recent Labs Lab 10/24/16 0534 10/26/16 0257 10/27/16 0433 10/28/16 0533 10/29/16 0408  WBC 9.2 10.9* 9.6 9.2 9.7  HGB 14.2 14.0 14.4 15.4 15.4  HCT 43.8 42.9 43.0 46.4 47.2  MCV 80.4 79.3 78.6 78.1 80.8  PLT 236 235 228 258 239   Basic Metabolic Panel:  Recent  Labs Lab 10/25/16 0532 10/26/16 0257 10/27/16 0433 10/28/16 0533 10/29/16 0408  NA 139 137 136 136 137  K 4.2 3.7 3.6 4.0 4.1  CL 101 100* 100* 101 102  CO2 27 28 27 26 27   GLUCOSE 148* 214* 236* 161* 164*  BUN 36* 32* 30* 28* 25*  CREATININE 1.67* 1.77* 1.56* 1.56* 1.65*  CALCIUM 9.2 8.9 9.3 9.2 9.6   GFR: Estimated Creatinine Clearance (by C-G formula based on SCr of 1.65 mg/dL (H)) Male: 09.872.6 mL/min Male: 87.6 mL/min Liver Function Tests:  Recent Labs Lab 10/22/16 2246  AST 30  ALT 35  ALKPHOS 38  BILITOT 1.1  PROT 6.9  ALBUMIN 3.9    Recent Labs Lab 10/22/16 2246  LIPASE 34   No results for input(s): AMMONIA in the last 168 hours. Coagulation Profile:  Recent Labs Lab 10/23/16 0505  INR 1.26   Cardiac Enzymes:  Recent Labs Lab 10/22/16 2246 10/23/16 0117 10/23/16 0809 10/23/16 1249 10/23/16 2009  TROPONINI 0.06* 0.06* 0.05* 0.05* 0.05*  BNP (last 3 results) No results for input(s): PROBNP in the last 8760 hours. HbA1C: No results for input(s): HGBA1C in the last 72 hours. CBG:  Recent Labs Lab 10/27/16 2145 10/28/16 0807 10/28/16 1244 10/28/16 1649 10/28/16 2105  GLUCAP 183* 144* 158* 195* 179*   Lipid Profile:  Recent Labs  10/28/16 0800  CHOL 159  HDL 32*  LDLCALC 100*  TRIG 134  CHOLHDL 5.0   Thyroid Function Tests: No results for input(s): TSH, T4TOTAL, FREET4, T3FREE, THYROIDAB in the last 72 hours. Anemia Panel: No results for input(s): VITAMINB12, FOLATE, FERRITIN, TIBC, IRON, RETICCTPCT in the last 72 hours. Urine analysis:    Component Value Date/Time   COLORURINE AMBER (A) 10/22/2016 2327   APPEARANCEUR CLEAR 10/22/2016 2327   LABSPEC 1.037 (H) 10/22/2016 2327   PHURINE 6.0 10/22/2016 2327   GLUCOSEU NEGATIVE 10/22/2016 2327   HGBUR NEGATIVE 10/22/2016 2327   BILIRUBINUR SMALL (A) 10/22/2016 2327   KETONESUR NEGATIVE 10/22/2016 2327   PROTEINUR >300 (A) 10/22/2016 2327   UROBILINOGEN 0.2 12/10/2010  2012   NITRITE NEGATIVE 10/22/2016 2327   LEUKOCYTESUR NEGATIVE 10/22/2016 2327   Sepsis Labs: @LABRCNTIP (procalcitonin:4,lacticidven:4)  ) Recent Results (from the past 240 hour(s))  MRSA PCR Screening     Status: None   Collection Time: 10/25/16  3:50 PM  Result Value Ref Range Status   MRSA by PCR NEGATIVE NEGATIVE Final    Comment:        The GeneXpert MRSA Assay (FDA approved for NASAL specimens only), is one component of a comprehensive MRSA colonization surveillance program. It is not intended to diagnose MRSA infection nor to guide or monitor treatment for MRSA infections.          Radiology Studies: No results found.      Scheduled Meds: . aspirin  81 mg Oral Daily  . atorvastatin  40 mg Oral q1800  . carvedilol  6.25 mg Oral BID  . digoxin  0.125 mg Oral Daily  . furosemide  20 mg Oral Daily  . guaiFENesin  600 mg Oral BID  . insulin aspart  0-9 Units Subcutaneous TID WC  . isosorbide mononitrate  30 mg Oral Daily  . pantoprazole  40 mg Oral Daily  . sacubitril-valsartan  1 tablet Oral BID  . sodium chloride flush  10-40 mL Intracatheter Q12H  . sodium chloride flush  3 mL Intravenous Q12H  . spironolactone  50 mg Oral Daily  . ticagrelor  90 mg Oral BID   Continuous Infusions: . milrinone 0.125 mcg/kg/min (10/29/16 0725)     LOS: 6 days    Time spent: 40 minutes    Rayan Ines, Roselind MessierURTIS J, MD [facility] Pager [phone]   If 7PM-7AM, please contact night-coverage [email or web address] Password [email or web address] 10/29/2016, 8:01 AM

## 2016-10-29 NOTE — Progress Notes (Signed)
Pt requested that RN follow up on MD saying pt could transfer from stepdown.  Amy Clegg notified of pt request.  Will continue to monitor pt closely.

## 2016-10-29 NOTE — Care Management Note (Signed)
Case Management Note  Patient Details  Name: [name] MRN: [id number] Date of Birth: [birth year]  Subjective/Objective:    CHF, CKD                Action/Plan: Discharge Planning: NCM spoke to pt and states he works but does not have any insurance coverage. States a policy would run him $400.00 out of pocket. States he cannot afford at this time.   Provided pt with Brilinta 30 day free trial card and Entresto 30 day free trial card. Provided pt with patient assistance application with for both medications. Explained he will need to complete his part and MD will complete Rx section. He will need to attach his income information and fax to manufacture to receive assistance with each med.   Provided pt with MATCH letter for assistance with his other meds for $3.00 copay. Explained program can only be used once per year.   Contacted [facility] Liaison to arrange follow up appt to see PCP. Had appt for today but cancelled.   Explained to pt the importance of taking meds as prescribed. And to follow up with Cardiologist for samples if he begins to run out before approved for patient assistance program.    Expected Discharge Date:  10/30/2016                Expected Discharge Plan:  Home/Self Care  In-House Referral:  PCP / [facility]  Discharge planning Services  CM Consult, Medication Assistance, MATCH Program, Indigent [facility] Clinic  Post Acute Care Choice:  NA Choice offered to:  NA  DME Arranged:  N/A DME Agency:  NA  HH Arranged:  NA HH Agency:  NA  Status of Service:  Completed, signed off  If discussed at Long Length of Stay Meetings, dates discussed:    Additional Comments:  Elliot CousinShavis, Lummie Montijo [name], RN 10/29/2016, 12:44 PM

## 2016-10-29 NOTE — Progress Notes (Signed)
Patient ID: Glenn McgeeHarold [name], male   DOB: [birth year], 45 y.o.   MRN: [id number]   SUBJECTIVE:  Patient diuresed very well again yesterday.  PICC placed, co-ox 45%.  Started on milrinone 0.25, co-ox 61% yesterday.  Milrinone decreased to 0.125, co-ox 47% this morning.  CVP 5.   Denies SOB/CP    LHC/RHC (10/25/16): Subtotal occlusion LCx/OM2 => DES x 2.  80% distal RCA, 90% ostial PDA => not addressed RA mean 20 PA 73/36 PCWP mean 28 CI 1.5 Fick CI 1.74 Thermo  PCI (10/30): DES to distal RCA-PDA.   Echo (10/17): EF 10%, severe MR likely functional  Scheduled Meds: . aspirin  81 mg Oral Daily  . atorvastatin  40 mg Oral q1800  . carvedilol  6.25 mg Oral BID  . digoxin  0.125 mg Oral Daily  . furosemide  20 mg Oral Daily  . guaiFENesin  600 mg Oral BID  . insulin aspart  0-9 Units Subcutaneous TID WC  . isosorbide mononitrate  30 mg Oral Daily  . pantoprazole  40 mg Oral Daily  . sacubitril-valsartan  1 tablet Oral BID  . sodium chloride flush  10-40 mL Intracatheter Q12H  . sodium chloride flush  3 mL Intravenous Q12H  . spironolactone  50 mg Oral Daily  . ticagrelor  90 mg Oral BID   Continuous Infusions: . milrinone 0.125 mcg/kg/min (10/29/16 0725)   PRN Meds:.sodium chloride, acetaminophen, guaiFENesin-dextromethorphan, nitroGLYCERIN, ondansetron (ZOFRAN) IV, sodium chloride flush, sodium chloride flush    Vitals:   10/28/16 2300 10/29/16 0000 10/29/16 0309 10/29/16 0736  BP: 113/89 125/89 124/87 (!) 107/92  Pulse: 66  73 75  Resp:    (!) 24  Temp: 97.5 F (36.4 C)  97.6 F (36.4 C) 97.4 F (36.3 C)  TempSrc: Oral  Oral Oral  SpO2: 95%  97% 97%  Weight:   283 lb 1.1 oz (128.4 kg)   Height:        Intake/Output Summary (Last 24 hours) at 10/29/16 0751 Last data filed at 10/29/16 0700  Gross per 24 hour  Intake           629.61 ml  Output             2145 ml  Net         -1515.39 ml    LABS: Basic Metabolic Panel:  Recent Labs  40/98/1110/30/17 0533  10/29/16 0408  NA 136 137  K 4.0 4.1  CL 101 102  CO2 26 27  GLUCOSE 161* 164*  BUN 28* 25*  CREATININE 1.56* 1.65*  CALCIUM 9.2 9.6   Liver Function Tests: No results for input(s): AST, ALT, ALKPHOS, BILITOT, PROT, ALBUMIN in the last 72 hours. No results for input(s): LIPASE, AMYLASE in the last 72 hours. CBC:  Recent Labs  10/28/16 0533 10/29/16 0408  WBC 9.2 9.7  HGB 15.4 15.4  HCT 46.4 47.2  MCV 78.1 80.8  PLT 258 239   Cardiac Enzymes: No results for input(s): CKTOTAL, CKMB, CKMBINDEX, TROPONINI in the last 72 hours. BNP: Invalid input(s): POCBNP D-Dimer: No results for input(s): DDIMER in the last 72 hours. Hemoglobin A1C: No results for input(s): HGBA1C in the last 72 hours. Fasting Lipid Panel:  Recent Labs  10/28/16 0800  CHOL 159  HDL 32*  LDLCALC 100*  TRIG 134  CHOLHDL 5.0   Thyroid Function Tests: No results for input(s): TSH, T4TOTAL, T3FREE, THYROIDAB in the last 72 hours.  Invalid input(s): FREET3 Anemia Panel: No results for  input(s): VITAMINB12, FOLATE, FERRITIN, TIBC, IRON, RETICCTPCT in the last 72 hours.  RADIOLOGY: Dg Chest 2 View  Result Date: 10/22/2016 CLINICAL DATA:  Cough, shortness of breath for 2 weeks. Near syncope tonight. History of diabetes, CHF. EXAM: CHEST  2 VIEW COMPARISON:  None. FINDINGS: Cardiac silhouette is moderately enlarged, mediastinal silhouette is nonsuspicious. Pulmonary vascular congestion without pleural effusion or focal consolidation. No pneumothorax. Soft tissue planes and included osseous structure nonsuspicious. IMPRESSION: Stable cardiomegaly and pulmonary vascular congestion. Electronically Signed   By: Awilda Metroourtnay  Bloomer M.D.   On: 10/22/2016 23:29    PHYSICAL EXAM CVP 5  General: NAD Neck: Thick, JVP difficult, no thyromegaly or thyroid nodule.  Lungs: Clear  CV: Heart regular S1/S2, no S3/S4, 2/6 HSM apex.  No peripheral edema.  Abdomen: Soft, nontender, no hepatosplenomegaly, no distention.    Neurologic: Alert and oriented x 3.  Psych: Normal affect. Extremities: No clubbing or cyanosis. RUE PICC double lumen  TELEMETRY: Reviewed telemetry pt in NSR  ASSESSMENT AND PLAN: 45 yo with history of DM, CAD, ischemic cardiomyopathy presented with acute on chronic systolic CHF. 1. Acute on chronic systolic CHF: Ischemic cardiomyopathy.  EF down to 10% on echo this admission.  RHC with markedly elevated right and left heart filling pressures and pulmonary venous hypertension.   Cardiac output was low.  PICC placed, with initial co-ox 45%. Co-ox increased on milrinone 0.25 but is down to 47% on milrinone 0.125.  He is not volume overloaded. - Keep milrinone at 0.125 and resend co-ox.     - Can decrease Lasix to 20 mg daily.  - Continue Coreg to 6.25 mg bid (has been cut back considerably).  - Continue spironolactone 50 mg daily. .   - Stop losartan, start Entresto 24/26 bid.  - Continue digoxin 0.125 mg daily.   - QRS not wide enough to benefit from CRT.  2. CAD: LCx system and distal RCA/PDA now revascularized.   - Continue ASA and ticagrelor.  - Continue statin.  3. Suspect OSA: Will need sleep study as outpatient.  4. CKD: Stage III.  Creatinine fairly stable at 1.65 today.   5. DMII: On sliding scale.   Will repeat co-ox, hopefully can titrate off milrinone and onto Entresto today.  Maybe home tomorrow.   [name] 10/29/2016 7:51 AM

## 2016-10-29 NOTE — Telephone Encounter (Signed)
Call received from Isidoro DonningAlesia Shavis, RN CM requesting [facility]  Informed her that there are not any appointments.  Will schedule an appointment if one becomes available before he is discharged. After discharge, he can call the clinic to check on appointment availability.

## 2016-10-29 NOTE — Progress Notes (Signed)
CARDIAC REHAB PHASE I   Pt states he just returned from ambulating independently, 3 laps around unit, no complaints, declines additional ambulation at this time. Completed PCI/stent/CHF education.  Reviewed risk factors, anti-platelet therapy (CM following for medication needs-pt does not have insurance), stent card, activity restrictions, ntg, exercise, heart healthy diet, carb counting, sodium restrictions, daily weights, s/s heart failure, and phase 2 cardiac rehab. Pt verbalized understanding. Pt agrees to phase 2 cardiac rehab referral, although he states he will not be able to attend due to cost, [facility]. Pt in recliner, call bell within reach. Will follow.  [phone] Joylene GrapesEmily C Emmaleah Meroney, RN, BSN 10/29/2016 2:26 PM

## 2016-10-30 ENCOUNTER — Other Ambulatory Visit (HOSPITAL_COMMUNITY): Payer: Self-pay | Admitting: *Deleted

## 2016-10-30 LAB — GLUCOSE, CAPILLARY
GLUCOSE-CAPILLARY: 180 mg/dL — AB (ref 65–99)
GLUCOSE-CAPILLARY: 248 mg/dL — AB (ref 65–99)

## 2016-10-30 LAB — CBC
HEMATOCRIT: 49.1 % (ref 39.0–52.0)
HEMOGLOBIN: 16.6 g/dL (ref 13.0–17.0)
MCH: 26.7 pg (ref 26.0–34.0)
MCHC: 33.8 g/dL (ref 30.0–36.0)
MCV: 79.1 fL (ref 78.0–100.0)
Platelets: 217 10*3/uL (ref 150–400)
RBC: 6.21 MIL/uL — AB (ref 4.22–5.81)
RDW: 14 % (ref 11.5–15.5)
WBC: 10.2 10*3/uL (ref 4.0–10.5)

## 2016-10-30 LAB — BASIC METABOLIC PANEL
ANION GAP: 9 (ref 5–15)
BUN: 19 mg/dL (ref 6–20)
CHLORIDE: 101 mmol/L (ref 101–111)
CO2: 26 mmol/L (ref 22–32)
Calcium: 9.5 mg/dL (ref 8.9–10.3)
Creatinine, Ser: 1.44 mg/dL — ABNORMAL HIGH (ref 0.61–1.24)
GFR calc Af Amer: >60 mL/min (ref >60)
GFR calc non Af Amer: 59 mL/min — ABNORMAL LOW (ref >60)
GLUCOSE: 182 mg/dL — AB (ref 65–99)
POTASSIUM: 4.5 mmol/L (ref 3.5–5.1)
Sodium: 136 mmol/L (ref 135–145)

## 2016-10-30 LAB — COOXEMETRY PANEL
Carboxyhemoglobin: 1.4 % (ref 0.5–1.5)
METHEMOGLOBIN: 0.8 % (ref 0.0–1.5)
O2 Saturation: 60.7 %
Total hemoglobin: 17.2 g/dL — ABNORMAL HIGH (ref 12.0–16.0)

## 2016-10-30 MED ORDER — ATORVASTATIN CALCIUM 40 MG PO TABS: 40.0000 mg | ORAL_TABLET | Freq: Every day | ORAL | 6 refills | Status: DC

## 2016-10-30 MED ORDER — CARVEDILOL 6.25 MG PO TABS
6.2500 mg | ORAL_TABLET | Freq: Two times a day (BID) | ORAL | 6 refills | Status: DC
Start: 2016-10-30 — End: 2016-11-28

## 2016-10-30 MED ORDER — SPIRONOLACTONE 50 MG PO TABS: 50.0000 mg | ORAL_TABLET | Freq: Every day | ORAL | 6 refills | Status: DC

## 2016-10-30 MED ORDER — OMEPRAZOLE 20 MG PO CPDR: 20.0000 mg | DELAYED_RELEASE_CAPSULE | Freq: Every day | ORAL | 11 refills | Status: DC

## 2016-10-30 MED ORDER — SACUBITRIL-VALSARTAN 24-26 MG PO TABS: 1.0000 | ORAL_TABLET | Freq: Two times a day (BID) | ORAL | 6 refills | Status: DC

## 2016-10-30 MED ORDER — TICAGRELOR 90 MG PO TABS: 90.0000 mg | ORAL_TABLET | Freq: Two times a day (BID) | ORAL | 6 refills | Status: DC

## 2016-10-30 MED ORDER — FUROSEMIDE 20 MG PO TABS: 20.0000 mg | ORAL_TABLET | Freq: Every day | ORAL | 6 refills | Status: DC

## 2016-10-30 MED ORDER — DIGOXIN 125 MCG PO TABS: 0.1250 mg | ORAL_TABLET | Freq: Every day | ORAL | 6 refills | Status: DC

## 2016-10-30 MED ORDER — ASPIRIN 81 MG PO CHEW: 81.0000 mg | CHEWABLE_TABLET | Freq: Every day | ORAL | 6 refills | Status: AC

## 2016-10-30 NOTE — Progress Notes (Signed)
Patient being discharged per MD order, All discharge instructions given to patient in written form.

## 2016-10-30 NOTE — Care Management (Signed)
1230 10-30-16 [facility] and Sherryll Burgerntresto is available. Pt has MATCH Letter from previous Case Manager. No further needs from CM at this time. Gala LewandowskyGraves-Bigelow, Lamarcus Spira Kaye, RN, BSN [phone]

## 2016-10-30 NOTE — Progress Notes (Signed)
Patient ID: [name], male   DOB: January 18, 1971, 45 y.o.   MRN: [id number]   SUBJECTIVE:  Patient diuresed very well again yesterday.  PICC placed, co-ox 45%.  Started on milrinone 0.25, co-ox 61% 10/28/16.  Milrinone decreased to 0.125, co-ox 47% 10/29/16.  Recheck 55% so milrinone stopped and Entresto added.   Coox pending this am.   Walking around the halls without difficulty.  No SOB/lightheadedness/dizziness.  Understand importance of dietary and medical compliance moving forward.     LHC/RHC (10/25/16): Subtotal occlusion LCx/OM2 => DES x 2.  80% distal RCA, 90% ostial PDA => not addressed RA mean 20 PA 73/36 PCWP mean 28 CI 1.5 Fick CI 1.74 Thermo  PCI (10/30): DES to distal RCA-PDA.   Echo (10/17): EF 10%, severe MR likely functional  Scheduled Meds: . aspirin  81 mg Oral Daily  . atorvastatin  40 mg Oral q1800  . carvedilol  6.25 mg Oral BID  . digoxin  0.125 mg Oral Daily  . furosemide  20 mg Oral Daily  . guaiFENesin  600 mg Oral BID  . insulin aspart  0-9 Units Subcutaneous TID WC  . isosorbide mononitrate  30 mg Oral Daily  . pantoprazole  40 mg Oral Daily  . sacubitril-valsartan  1 tablet Oral BID  . sodium chloride flush  10-40 mL Intracatheter Q12H  . sodium chloride flush  3 mL Intravenous Q12H  . spironolactone  50 mg Oral Daily  . ticagrelor  90 mg Oral BID   Continuous Infusions:   PRN Meds:.sodium chloride, acetaminophen, guaiFENesin-dextromethorphan, nitroGLYCERIN, ondansetron (ZOFRAN) IV, sodium chloride flush, sodium chloride flush    Vitals:   10/29/16 1600 10/29/16 1807 10/29/16 2220 10/30/16 0500  BP: (!) 124/94 130/89 (!) 109/59   Pulse: 80 72 68   Resp: 18  17   Temp: 97.8 F (36.6 C) 98.7 F (37.1 C) 97.7 F (36.5 C)   TempSrc: Oral Oral Oral   SpO2: 99% 100% 99%   Weight:    280 lb 1.6 oz (127.1 kg)  Height:  6\' 6"  (1.981 m)      Intake/Output Summary (Last 24 hours) at 10/30/16 0740 Last data filed at 10/30/16 0535  Gross  per 24 hour  Intake          1345.25 ml  Output             3125 ml  Net         -1779.75 ml    LABS: Basic Metabolic Panel:  Recent Labs  40/98/1110/31/17 0408 10/30/16 0511  NA 137 136  K 4.1 4.5  CL 102 101  CO2 27 26  GLUCOSE 164* 182*  BUN 25* 19  CREATININE 1.65* 1.44*  CALCIUM 9.6 9.5   Liver Function Tests: No results for input(s): AST, ALT, ALKPHOS, BILITOT, PROT, ALBUMIN in the last 72 hours. No results for input(s): LIPASE, AMYLASE in the last 72 hours. CBC:  Recent Labs  10/29/16 0408 10/30/16 0511  WBC 9.7 10.2  HGB 15.4 16.6  HCT 47.2 49.1  MCV 80.8 79.1  PLT 239 217   Cardiac Enzymes: No results for input(s): CKTOTAL, CKMB, CKMBINDEX, TROPONINI in the last 72 hours. BNP: Invalid input(s): POCBNP D-Dimer: No results for input(s): DDIMER in the last 72 hours. Hemoglobin A1C: No results for input(s): HGBA1C in the last 72 hours. Fasting Lipid Panel:  Recent Labs  10/28/16 0800  CHOL 159  HDL 32*  LDLCALC 100*  TRIG 134  CHOLHDL 5.0  Thyroid Function Tests: No results for input(s): TSH, T4TOTAL, T3FREE, THYROIDAB in the last 72 hours.  Invalid input(s): FREET3 Anemia Panel: No results for input(s): VITAMINB12, FOLATE, FERRITIN, TIBC, IRON, RETICCTPCT in the last 72 hours.  RADIOLOGY: Dg Chest 2 View  Result Date: 10/22/2016 CLINICAL DATA:  Cough, shortness of breath for 2 weeks. Near syncope tonight. History of diabetes, CHF. EXAM: CHEST  2 VIEW COMPARISON:  None. FINDINGS: Cardiac silhouette is moderately enlarged, mediastinal silhouette is nonsuspicious. Pulmonary vascular congestion without pleural effusion or focal consolidation. No pneumothorax. Soft tissue planes and included osseous structure nonsuspicious. IMPRESSION: Stable cardiomegaly and pulmonary vascular congestion. Electronically Signed   By: Awilda Metroourtnay  Bloomer M.D.   On: 10/22/2016 23:29    PHYSICAL EXAM General: NAD Neck: Thick, JVP difficult, no thyromegaly or thyroid  nodule.  Lungs: Clear  CV: RRR. No gallops or rubs heard.  2/6 HSM apex.  No peripheral edema.  Abdomen: Soft, NT, ND, no HSM. No bruits or masses. +BS  Neurologic: Alert and oriented x 3.  Psych: Normal affect. Extremities: No clubbing or cyanosis. RUE PICC double lumen  TELEMETRY: Reviewed telemetry pt in NSR  ASSESSMENT AND PLAN: 45 yo with history of DM, CAD, ischemic cardiomyopathy presented with acute on chronic systolic CHF. 1. Acute on chronic systolic CHF: Ischemic cardiomyopathy.  EF down to 10% on echo this admission.  RHC with markedly elevated right and left heart filling pressures and pulmonary venous hypertension.   Cardiac output was low.  PICC placed, with initial co-ox 45%. Co-ox increased on milrinone 0.25 but is down to 47% on milrinone 0.125.  He is not volume overloaded. - Maintain off milrinone for now and await repeat coox. If stable can likely go home today.  If low may need to watch one more day.      - Conitnue Lasix 20 mg daily.  - Continue Coreg 6.25 mg BID. Will not uptitrate with low output.  Has been greatly reduced from PTA dose.  - Continue spironolactone 50 mg daily. .   - Continue Entresto 24/26 BID.  - Continue digoxin 0.125 mg daily.   - QRS not wide enough to benefit from CRT.  2. CAD: LCx system and distal RCA/PDA now revascularized.   - Continue ASA and ticagrelor.  - Continue statin.  3. Suspect OSA: - Will refer for sleep study as outpatient.  4. CKD: Stage III - Stable to improved at 1.44 this am.  5. DMII: On sliding scale.   Awaiting coox. BP has been stable to soft. Possibly home if coox stable. If low, can adjust meds further.  Will need close HF follow up.   Graciella FreerMichael Andrew Tillery, PA-C 10/30/2016 7:40 AM  Advanced Heart Failure Team Pager [phone] (M-F; 7a - 4p)  Please contact [facility] for night-coverage after hours (4p -7a ) and weekends on [email or web address]  Patient seen with PA, agree with the above note.  Co-ox 61% today.  He  is stable for discharge.  We will see in about 10 days in CHF clinic.    Cardiac meds for home (different than pre-admission meds):  Coreg 6.25 mg bid Digoxin 0.125 daily Lasix 20 mg daily Entresto 24/26 bid Spironolactone 50 daily ASA 81 daily ticagrelor 90 mg bid  [name] 10/30/2016 9:11 AM

## 2016-10-30 NOTE — Discharge Summary (Signed)
DISCHARGE SUMMARY  Glenn McgeeHarold [name]  MR#: [id number]  DOB:03/15/1971  Date of Admission: 10/22/2016 Date of Discharge: 10/30/2016  Attending Physician:Kadyn Guild T  Patient's PCP:No PCP Per Patient  [facility]   Disposition: D/C home   Follow-up Appts: Follow-up Information    [facility] Follow up on 11/08/2016.   Why:  at 2:00pm [facility] Dr Carleene OverlieAmao Contact information: [address] [place] [phone]       Marca Anconaalton McLean, MD Follow up on 11/12/2016.   Specialty:  Cardiology Why:  at 0930 am for post hospital follow up. Please bring all of your medication to your visit. The code for parking is 0004. Enter into the contstruction zone off of [address].  The parking garage is on your right.  Contact information: [name] [place] [address] [phone]           Tests Needing Follow-up: - will need outpt referral for sleep study   Discharge Diagnoses: Acute on chronic systolic CHF CKD Stage III CAD DM HTN Suspected OSA  Initial presentation: 44 y.o.malewith history of HTN, systolic CHF (EF 60-63%35-40% Jan 2016), CAD w/ MI,andHLD who presented with complaints of intermittent shortness of breath over 3-4 weeks. Symptoms usually worsened with exertion and when laying flat. He noted a baseline weight of 290 pounds.  In the ED his weight was noted to be 308.   Hospital Course:  Acute on chronic systolic CHF This issue, the primary reason for his hospital stay, was managed by the CHF Team th/o his hospital stay.  The CHF Team noted the following on his last day of hospitalization:  Ischemic cardiomyopathy.  EF down to 10% on echo this admission.  RHC with markedly elevated right and left heart filling pressures and pulmonary venous hypertension.   Cardiac output was low.  PICC placed, with initial co-ox 45%.  Co-ox increased on milrinone 0.25 but is down to 47% on milrinone 0.125.  He is not volume overloaded.   - Conitnue Lasix 20 mg daily.  - Continue Coreg 6.25 mg BID. Will not uptitrate with low output.  Has been greatly reduced from PTA dose.  - Continue spironolactone 50 mg daily. .   - Continue Entresto 24/26 BID.  - Continue digoxin 0.125 mg daily.   - QRS not wide enough to benefit from CRT.   CKD Stage III crt stable at time of d/c   Recent Labs Lab 10/26/16 0257 10/27/16 0433 10/28/16 0533 10/29/16 0408 10/30/16 0511  CREATININE 1.77* 1.56* 1.56* 1.65* 1.44*    CAD This issue was managed by Cardiology th/o his hospital stay.  As per Cardiology: LCx system and distal RCA/PDA now revascularized.   - Continue ASA and ticagrelor.  - Continue statin.   DM Variable but mostly well controlled th/o the hospital stay - resume usual oral tx at time of d/c   HTN Well controlled at this time - no change in tx plan today  Suspected OSA For outpatient evaluation once CHF compensated/controlled - will need outpt referral for sleep study     Medication List    STOP taking these medications   amLODipine 10 MG tablet Commonly known as:  NORVASC   aspirin EC 325 MG tablet Replaced by:  aspirin 81 MG chewable tablet   isosorbide mononitrate 30 MG 24 hr tablet Commonly known as:  IMDUR   lisinopril 20 MG tablet Commonly known as:  PRINIVIL,ZESTRIL   nitroGLYCERIN 0.4 MG SL tablet Commonly known as:  NITROSTAT     TAKE these medications   aspirin 81 MG chewable tablet Chew 1 tablet (81 mg total) by mouth daily. Start taking on:  10/31/2016 Replaces:  aspirin EC 325 MG tablet   atorvastatin 40 MG tablet Commonly known as:  LIPITOR Take 1 tablet (40 mg total) by mouth daily at 6 PM.   carvedilol 6.25 MG tablet Commonly known as:  COREG Take 1 tablet (6.25 mg total) by mouth 2 (two) times daily. What changed:  See the new instructions.   digoxin 0.125 MG  tablet Commonly known as:  LANOXIN Take 1 tablet (0.125 mg total) by mouth daily. Start taking on:  10/31/2016   furosemide 20 MG tablet Commonly known as:  LASIX Take 1 tablet (20 mg total) by mouth daily. What changed:  additional instructions   glipiZIDE 10 MG tablet Commonly known as:  GLUCOTROL Take 1 tablet (10 mg total) by mouth 2 (two) times daily before a meal.   omeprazole 20 MG capsule Commonly known as:  PRILOSEC Take 20 mg by mouth daily.   sacubitril-valsartan 24-26 MG Commonly known as:  ENTRESTO Take 1 tablet by mouth 2 (two) times daily.   spironolactone 50 MG tablet Commonly known as:  ALDACTONE Take 1 tablet (50 mg total) by mouth daily. What changed:  See the new instructions.   ticagrelor 90 MG Tabs tablet Commonly known as:  BRILINTA Take 1 tablet (90 mg total) by mouth 2 (two) times daily.       Day of Discharge BP (!) 109/59 (BP Location: Left Arm)   Pulse 77   Temp 97.7 F (36.5 C) (Oral)   Resp 17   Ht 6\' 6"  (1.981 m)   Wt 127.1 kg (280 lb 1.6 oz)   SpO2 99%   BMI 32.37 kg/m   Physical Exam: General: No acute respiratory distress Lungs: Clear to auscultation bilaterally without wheezes or crackles Cardiovascular: Regular rate and rhythm without murmur gallop or rub normal S1 and S2 Abdomen: Nontender, nondistended, soft, bowel sounds positive, no rebound, no ascites, no appreciable mass Extremities: No significant cyanosis, clubbing, or edema bilateral lower extremities  Basic Metabolic Panel:  Recent Labs Lab 10/26/16 0257 10/27/16 0433 10/28/16 0533 10/29/16 0408 10/30/16 0511  NA 137 136 136 137 136  K 3.7 3.6 4.0 4.1 4.5  CL 100* 100* 101 102 101  CO2 28 27 26 27 26   GLUCOSE 214* 236* 161* 164* 182*  BUN 32* 30* 28* 25* 19  CREATININE 1.77* 1.56* 1.56* 1.65* 1.44*  CALCIUM 8.9 9.3 9.2 9.6 9.5   CBC:  Recent Labs Lab 10/26/16 0257 10/27/16 0433 10/28/16 0533 10/29/16 0408 10/30/16 0511  WBC 10.9* 9.6 9.2 9.7  10.2  HGB 14.0 14.4 15.4 15.4 16.6  HCT 42.9 43.0 46.4 47.2 49.1  MCV 79.3 78.6 78.1 80.8 79.1  PLT 235 228 258 239 217    Cardiac Enzymes:  Recent Labs Lab 10/23/16 1249 10/23/16 2009  TROPONINI 0.05* 0.05*   BNP (last 3 results)  Recent Labs  10/22/16 2246  BNP 755.4*    CBG:  Recent Labs Lab 10/29/16 1311 10/29/16 1634 10/29/16 2200 10/30/16 0721 10/30/16 1130  GLUCAP 155* 194* 150* 180* 248*    Recent Results (from the past 240 hour(s))  MRSA PCR Screening     Status: None   Collection Time: 10/25/16  3:50 PM  Result Value Ref Range Status   MRSA by  PCR NEGATIVE NEGATIVE Final    Comment:        The GeneXpert MRSA Assay (FDA approved for NASAL specimens only), is one component of a comprehensive MRSA colonization surveillance program. It is not intended to diagnose MRSA infection nor to guide or monitor treatment for MRSA infections.       Time spent in discharge (includes decision making & examination of pt): >30 minutes  10/30/2016, 12:11 PM   Lonia BloodJeffrey T. Ronnita Paz, MD [facility] Office  [phone] Pager [phone]  On-[name]/Text Page:      [email or web address]      password [email or web address]

## 2016-10-30 NOTE — Hospital Discharge Follow-Up (Signed)
Transitional Care Clinic Care Coordination Note:  Admit date:  10/22/2016 Discharge date: 10/30/2016 Discharge Disposition: home Patient contact: cell # [phone] Emergency contact(s): none at present  This Case Manager reviewed patient's EMR and determined patient would benefit from post-discharge medical management and chronic care management services through the [facility]. Patient has a history of CHF, ischemic cardiomyopathy,  CAD, DM, CKD, MI. He has 1 Ed visit and 1 hospital admission in 2 months. He was admitted with acute on chronic systolic CHF. S/p cardiac cath - 10/25/2016.  This Case Manager met with patient to discuss the services and medical management that can be provided at the [facility]. Patient verbalized understanding and agreed to receive post-discharge care at the [facility].   Patient scheduled for Transitional Care appointment on 11/08/2016 @ 1400.  Clinic information and appointment time provided to patient. Appointment information also placed on AVS.  Assessment:       Home Environment: stated he has a roommate who lives in his home with him        Support System: his father who was visiting with him in the hospital when CM was meeting with him       Level of functioning: independent. Stated that he works full time at a [employer].        Home DME: none       Home care services: (services arranged prior to discharge or new services after discharge): none       Transportation drives.         Food/Nutrition: (ability to afford, access, use of any community resources) : no problems obtaining /affording food.          Medications: (ability to afford, access, compliance, Pharmacy used): currently uses [facility]. He noted tha this prescriptions usually cost $4-10 at [facility].  Stated that he has a glucometer that he had used "periodically" but he can't locate it now.         Identified Barriers: no insurance/coverage for  medications, the high cost of brillinta and entresto, no PCP and mounting medical bills.  He said that he has to pay for his cardiology appointments. .  The patient stated that his doctor told him that he needs a CPAP but he will not be able to get a machine without insurance, so he has not ordered a sleep study. This CM explained to the patient that he can discuss this concern with Dr Jarold Song and also explained that the [facility] has resources to help patients get a CPAP if they do not have insurance however, he will need to have a sleep study done first.  He stated that he does not qualify for food stamps or medicaid due to his income.         PCP (Name, office location, phone number): none at present time.   Patient Education: explained the services provided at [facility] including pharmacy assistance and financial counseling. He stated that he had an [employer] in the past but it has expired. Discussed the need to re-apply for the [employer] and [employer] and to schedule an appointment with the [employer].             Arranged services:        Services communicated to/with  Jacqlyn Krauss. RN CM

## 2016-10-30 NOTE — Discharge Instructions (Signed)

## 2016-10-31 ENCOUNTER — Telehealth: Payer: Self-pay

## 2016-10-31 NOTE — Telephone Encounter (Signed)
Transitional Care Clinic Post-discharge Follow-Up Phone Call:  Date of Discharge: 10/30/16 Principal Discharge Diagnosis(es): Acute on chronic CHF, CKD Stage III, CAD, DM, HTN, Suspected OSA Post-discharge Communication: Attempt #1 to complete discharge follow-up phone call. Call placed to #[phone]; unable to reach patient. HIPPA compliant voicemail left requesting return call. Call Completed: No

## 2016-11-01 ENCOUNTER — Telehealth: Payer: Self-pay

## 2016-11-01 NOTE — Telephone Encounter (Signed)
Transitional Care Clinic Post-discharge Follow-Up Phone Call:  Date of Discharge: 10/30/16 Principal Discharge Diagnosis(es): Acute on chronic systolic CHF, CKD Stage III, CAD, DM, HTN, Suspected OSA Call Completed: Yes                  With Whom: Patient    Please check all that apply:  X  Patient is knowledgeable of his/her condition(s) and/or treatment. X  Patient is caring for self at home.  ? Patient is receiving assist at home from family and/or caregiver. Family and/or caregiver is knowledgeable of patient's condition(s) and/or treatment. ? Patient is receiving home health services. If so, name of agency.     Medication Reconciliation:  X  Medication list reviewed with patient. X  Patient obtained all discharge medications-No. Patient has picked up all discharge medications except prilosec, and patient indicated he plans to pick up prilosec from the pharmacy today.  Thoroughly reviewed patient's entire medication list, and patient has all other medications. Patient indicated he was given coupons for 30 day supply of Brilinta and Entresto. Informed patient he will benefit from meeting with Milderd Meagerriana, Patient Assistance Coordinator, to determine if eligible for patient assistance programs for [place] and Entresto. Patient verbalized understanding. Also informed patient that per discharge summary he is STOP taking the following medications: amlodipine 10 mg tablet, aspiring 325 mg tablet, isosorbide mononitrate 30 mg 24 hr tablet, lisinopril 20 mg tablet, nitroglycerin 0.4 mg SL tablet. Patient verbalized understanding.   Activities of Daily Living:  X  Independent ? Needs assist  ? Total Care    Community resources in place for patient:  X  None  ? Home Health/Home DME ? Assisted Living ? Support Group          Patient Education: Explained the onsite services available at [facility] including pharmacy and financial counseling. Informed patient he may  benefit from meeting with Financial Counselor to determine if eligible for [employer] or [employer] Discount Program. Patient verbalized understanding.        Questions/Concerns discussed: Inquired about patient's status. Patient indicated he was doing well and denied chest pain or shortness of breath. Reminded patient of Transitional Care Clinic appointment on 11/08/16 at 1400 with Dr. Venetia NightAmao and provided patient with clinic address and phone number. In addition, reminded patient of Cardiology appointment on 11/12/16 at 0930 with Dr. Shirlee LatchMcLean. Patient verbalized understanding and appreciative of information. No additional needs/concerns identified.

## 2016-11-01 NOTE — Telephone Encounter (Signed)
Transitional Care Clinic Post-discharge Follow-Up Phone Call:  Date of Discharge: 10/30/16 Principal Discharge Diagnosis(es): Acute on chronic systolic CHF, CKD Stage III, CAD, DM, HTN, Suspected OSA Post-discharge Communication: Attempt #2 to reach patient and complete post-discharge follow-up phone call. Call placed to #[phone]; however, unable to reach patient. HIPPA compliant voicemail left requesting return call. Call Completed: No

## 2016-11-07 ENCOUNTER — Telehealth: Payer: Self-pay

## 2016-11-07 NOTE — Telephone Encounter (Signed)
This Case Manager placed call to patient to remind him of upcoming Transitional Care Clinic appointment on 11/08/16 at 1000 with Dr. Venetia NightAmao. Patient aware of appointment. No transportation barriers identified.

## 2016-11-08 ENCOUNTER — Ambulatory Visit: Payer: Self-pay | Attending: Family Medicine | Admitting: Family Medicine

## 2016-11-08 ENCOUNTER — Encounter: Payer: Self-pay | Admitting: Family Medicine

## 2016-11-08 VITALS — BP 139/99 | HR 82 | Temp 98.3°F | Resp 20 | Ht >= 80 in | Wt 282.0 lb

## 2016-11-08 DIAGNOSIS — N528 Other male erectile dysfunction: Secondary | ICD-10-CM

## 2016-11-08 DIAGNOSIS — R918 Other nonspecific abnormal finding of lung field: Secondary | ICD-10-CM | POA: Insufficient documentation

## 2016-11-08 DIAGNOSIS — E785 Hyperlipidemia, unspecified: Secondary | ICD-10-CM | POA: Insufficient documentation

## 2016-11-08 DIAGNOSIS — N179 Acute kidney failure, unspecified: Secondary | ICD-10-CM | POA: Insufficient documentation

## 2016-11-08 DIAGNOSIS — I2510 Atherosclerotic heart disease of native coronary artery without angina pectoris: Secondary | ICD-10-CM | POA: Insufficient documentation

## 2016-11-08 DIAGNOSIS — N529 Male erectile dysfunction, unspecified: Secondary | ICD-10-CM | POA: Insufficient documentation

## 2016-11-08 DIAGNOSIS — E1169 Type 2 diabetes mellitus with other specified complication: Secondary | ICD-10-CM

## 2016-11-08 DIAGNOSIS — I110 Hypertensive heart disease with heart failure: Secondary | ICD-10-CM | POA: Insufficient documentation

## 2016-11-08 DIAGNOSIS — I255 Ischemic cardiomyopathy: Secondary | ICD-10-CM | POA: Insufficient documentation

## 2016-11-08 DIAGNOSIS — I5023 Acute on chronic systolic (congestive) heart failure: Secondary | ICD-10-CM | POA: Insufficient documentation

## 2016-11-08 DIAGNOSIS — E669 Obesity, unspecified: Secondary | ICD-10-CM | POA: Insufficient documentation

## 2016-11-08 DIAGNOSIS — N289 Disorder of kidney and ureter, unspecified: Secondary | ICD-10-CM

## 2016-11-08 DIAGNOSIS — I252 Old myocardial infarction: Secondary | ICD-10-CM | POA: Insufficient documentation

## 2016-11-08 DIAGNOSIS — Z7982 Long term (current) use of aspirin: Secondary | ICD-10-CM | POA: Insufficient documentation

## 2016-11-08 DIAGNOSIS — E119 Type 2 diabetes mellitus without complications: Secondary | ICD-10-CM | POA: Insufficient documentation

## 2016-11-08 DIAGNOSIS — I10 Essential (primary) hypertension: Secondary | ICD-10-CM

## 2016-11-08 DIAGNOSIS — I5021 Acute systolic (congestive) heart failure: Secondary | ICD-10-CM

## 2016-11-08 LAB — POCT GLYCOSYLATED HEMOGLOBIN (HGB A1C): Hemoglobin A1C: 7.6

## 2016-11-08 LAB — GLUCOSE, POCT (MANUAL RESULT ENTRY): POC GLUCOSE: 137 mg/dL — AB (ref 70–99)

## 2016-11-08 NOTE — Progress Notes (Signed)
Patient her for hospital f/u: chf.  CBG 137 mg/dl.

## 2016-11-08 NOTE — Patient Instructions (Signed)
Diabetes Mellitus and Food It is important for you to manage your blood sugar (glucose) level. Your blood glucose level can be greatly affected by what you eat. Eating healthier foods in the appropriate amounts throughout the day at about the same time each day will help you control your blood glucose level. It can also help slow or prevent worsening of your diabetes mellitus. Healthy eating may even help you improve the level of your blood pressure and reach or maintain a healthy weight.  General recommendations for healthful eating and cooking habits include:  Eating meals and snacks regularly. Avoid going long periods of time without eating to lose weight.  Eating a diet that consists mainly of plant-based foods, such as fruits, vegetables, nuts, legumes, and whole grains.  Using low-heat cooking methods, such as baking, instead of high-heat cooking methods, such as deep frying. Work with your dietitian to make sure you understand how to use the Nutrition Facts information on food labels. HOW CAN FOOD AFFECT ME? Carbohydrates Carbohydrates affect your blood glucose level more than any other type of food. Your dietitian will help you determine how many carbohydrates to eat at each meal and teach you how to count carbohydrates. Counting carbohydrates is important to keep your blood glucose at a healthy level, especially if you are using insulin or taking certain medicines for diabetes mellitus. Alcohol Alcohol can cause sudden decreases in blood glucose (hypoglycemia), especially if you use insulin or take certain medicines for diabetes mellitus. Hypoglycemia can be a life-threatening condition. Symptoms of hypoglycemia (sleepiness, dizziness, and disorientation) are similar to symptoms of having too much alcohol.  If your health care provider has given you approval to drink alcohol, do so in moderation and use the following guidelines:  Women should not have more than one drink per day, and men  should not have more than two drinks per day. One drink is equal to:  12 oz of beer.  5 oz of wine.  1 oz of hard liquor.  Do not drink on an empty stomach.  Keep yourself hydrated. Have water, diet soda, or unsweetened iced tea.  Regular soda, juice, and other mixers might contain a lot of carbohydrates and should be counted. WHAT FOODS ARE NOT RECOMMENDED? As you make food choices, it is important to remember that all foods are not the same. Some foods have fewer nutrients per serving than other foods, even though they might have the same number of calories or carbohydrates. It is difficult to get your body what it needs when you eat foods with fewer nutrients. Examples of foods that you should avoid that are high in calories and carbohydrates but low in nutrients include:  Trans fats (most processed foods list trans fats on the Nutrition Facts label).  Regular soda.  Juice.  Candy.  Sweets, such as cake, pie, doughnuts, and cookies.  Fried foods. WHAT FOODS CAN I EAT? Eat nutrient-rich foods, which will nourish your body and keep you healthy. The food you should eat also will depend on several factors, including:  The calories you need.  The medicines you take.  Your weight.  Your blood glucose level.  Your blood pressure level.  Your cholesterol level. You should eat a variety of foods, including:  Protein.  Lean cuts of meat.  Proteins low in saturated fats, such as fish, egg whites, and beans. Avoid processed meats.  Fruits and vegetables.  Fruits and vegetables that may help control blood glucose levels, such as apples, mangoes, and   yams.  Dairy products.  Choose fat-free or low-fat dairy products, such as milk, yogurt, and cheese.  Grains, bread, pasta, and rice.  Choose whole grain products, such as multigrain bread, whole oats, and brown rice. These foods may help control blood pressure.  Fats.  Foods containing healthful fats, such as nuts,  avocado, olive oil, canola oil, and fish. DOES EVERYONE WITH DIABETES MELLITUS HAVE THE SAME MEAL PLAN? Because every person with diabetes mellitus is different, there is not one meal plan that works for everyone. It is very important that you meet with a dietitian who will help you create a meal plan that is just right for you.   This information is not intended to replace advice given to you by your health care provider. Make sure you discuss any questions you have with your health care provider.   Document Released: 09/12/2005 Document Revised: 01/06/2015 Document Reviewed: 11/12/2013 Elsevier Interactive Patient Education 2016 Elsevier Inc.  

## 2016-11-08 NOTE — Progress Notes (Signed)
Transitional care clinic  Date of telephone encounter: 10/31/16  Hospitalization dates: 10/22/16 through 10/30/16   Subjective:  Patient ID: [name], male    DOB: [birth year]  Age: 45 y.o. MRN: [id number]  CC: Hospital follow-up  HPI [name] presents for follow-up at the transitional care clinic. Medical history is significant for ischemic cardiomyopathy (EF 10% from 09/2016 which is down compared to 35-40% nine months prior), coronary artery disease, type 2 diabetes mellitus (A1c 7.6), hypertension, obesity recently hospitalized for CHF exacerbation.  2-D echo revealed EF of 10% with markedly elevated right and left heart filling pressures and pulmonary venous hypertension. He was diuresed with IV Lasix and had a Cardiac cath in which he underwent revascularization of LCx system and  RCA /PDA and he was maintained on dual antiplatelet therapy with aspirin and Brilinta. Blood work did reveal some renal insufficiency which gradually improved to a discharge Cr of 1.44 He did receive Milrinone during his hospital course and once creatinine stabilized he was commenced on Entresto.  His condition improved, milrinone was tapered off and he was scheduled to follow-up with cardiology next week with a suggestion for sleep study to evaluate for sleep apnea.  Today he reports feeling fine and denies chest pains or shortness of breath, he has a good exercise tolerance. He has gained 2 pounds since discharge, denies pedal edema. He is concerned about erectile dysfunction.  Past Medical History:  Diagnosis Date  . CAD (coronary artery disease)    NSTEMI 10/2011: LHC 11/04/11: pLAD 90%, mLAD 60-70%, small D2 sub totally occluded at ostium, small OM1 90% ostial, 90% mid, mOM2 30%, oPL 80%, RCA 30%, dPDA 80%, EF 20% with ant AK.  PCI:  Promus DES to pLAD.  [name]. Chronic systolic heart failure (HCC)   . DM2 (diabetes mellitus, type 2) (HCC)   . HTN (hypertension)   . Hyperlipidemia   .  Ischemic cardiomyopathy    Echo 11/03/11: mod LVH, mild focal basal septal hypertrophy, EF 15%, grade 2 diast dysfxn, mild MR, mild to mod LAE, mild RVE, mild to mod reduced RVSF.  EF 3/5 50% by echo  . Obesity     Outpatient Medications Prior to Visit  Medication Sig Dispense Refill  . aspirin 81 MG chewable tablet Chew 1 tablet (81 mg total) by mouth daily. 30 tablet 6  . atorvastatin (LIPITOR) 40 MG tablet Take 1 tablet (40 mg total) by mouth daily at 6 PM. 30 tablet 6  . carvedilol (COREG) 6.25 MG tablet Take 1 tablet (6.25 mg total) by mouth 2 (two) times daily. 60 tablet 6  . digoxin (LANOXIN) 0.125 MG tablet Take 1 tablet (0.125 mg total) by mouth daily. 30 tablet 6  . furosemide (LASIX) 20 MG tablet Take 1 tablet (20 mg total) by mouth daily. 30 tablet 6  . glipiZIDE (GLUCOTROL) 10 MG tablet Take 1 tablet (10 mg total) by mouth 2 (two) times daily before a meal. 180 tablet 3  . omeprazole (PRILOSEC) 20 MG capsule Take 1 capsule (20 mg total) by mouth daily. 30 capsule 11  . sacubitril-valsartan (ENTRESTO) 24-26 MG Take 1 tablet by mouth 2 (two) times daily. 60 tablet 6  . spironolactone (ALDACTONE) 50 MG tablet Take 1 tablet (50 mg total) by mouth daily. 30 tablet 6  . ticagrelor (BRILINTA) 90 MG TABS tablet Take 1 tablet (90 mg total) by mouth 2 (two) times daily. 60 tablet 6   No facility-administered medications prior to visit.  ROS Review of Systems  Constitutional: Negative for activity change and appetite change.  HENT: Negative for sinus pressure and sore throat.   Eyes: Negative for visual disturbance.  Respiratory: Negative for cough, chest tightness and shortness of breath.   Cardiovascular: Negative for chest pain and leg swelling.  Gastrointestinal: Negative for abdominal distention, abdominal pain, constipation and diarrhea.  Endocrine: Negative.   Genitourinary: Negative for dysuria.  Musculoskeletal: Negative for joint swelling and myalgias.  Skin: Negative  for rash.  Allergic/Immunologic: Negative.   Neurological: Negative for weakness, light-headedness and numbness.  Psychiatric/Behavioral: Negative for dysphoric mood and suicidal ideas.    Objective:  BP (!) 139/99   Pulse 82   Temp 98.3 F (36.8 C) (Oral)   Resp 20   Ht 6\' 8"  (2.032 m)   Wt 282 lb (127.9 kg)   SpO2 99%   BMI 30.98 kg/m   BP/Weight 11/08/2016 10/30/2016 10/29/2016  Systolic BP 139 - 109  Diastolic BP 99 - 59  Wt. (Lbs) 282 280.1 -  BMI 30.98 32.37 -    Filed Weights   11/08/16 1357  Weight: 282 lb (127.9 kg)    Physical Exam  Constitutional: He is oriented to person, place, and time. He appears well-developed and well-nourished.  HENT:  Right Ear: External ear normal.  Left Ear: External ear normal.  Mouth/Throat: Oropharynx is clear and moist.  Neck: No JVD present.  Cardiovascular: Normal rate, normal heart sounds and intact distal pulses.   No murmur heard. Pulmonary/Chest: Effort normal and breath sounds normal. He has no wheezes. He has no rales. He exhibits no tenderness.  Abdominal: Soft. Bowel sounds are normal. He exhibits no distension and no mass. There is no tenderness.  Musculoskeletal: Normal range of motion.  Neurological: He is alert and oriented to person, place, and time.     CMP Latest Ref Rng & Units 10/30/2016 10/29/2016 10/28/2016  Glucose 65 - 99 mg/dL 161(W) 960(A) 540(J)  BUN 6 - 20 mg/dL 19 81(X) 91(Y)  Creatinine 0.61 - 1.24 mg/dL 7.82(N) 5.62(Z) 3.08(M)  Sodium 135 - 145 mmol/L 136 137 136  Potassium 3.5 - 5.1 mmol/L 4.5 4.1 4.0  Chloride 101 - 111 mmol/L 101 102 101  CO2 22 - 32 mmol/L 26 27 26   Calcium 8.9 - 10.3 mg/dL 9.5 9.6 9.2  Total Protein 6.5 - 8.1 g/dL - - -  Total Bilirubin 0.3 - 1.2 mg/dL - - -  Alkaline Phos 38 - 126 U/L - - -  AST 15 - 41 U/L - - -  ALT 17 - 63 U/L - - -      *[place]*                  *[facility]*                          [address]                         [place], [place] [address]                            [phone]  ------------------------------------------------------------------- Transthoracic Echocardiography  (Report amended )  Patient:    [name], [name] MR #:       [id number] Study Date: 10/24/2016 Gender:     M Age:        77  Height:     198.1 cm Weight:     134.4 kg BSA:        2.76 m^2 Pt. Status: Room:       1414   ATTENDING    Ward GivensKnapp, Iva L  SONOGRAPHER  Arvil Chacoachel Foster  PERFORMING   [facility], Inpatient  ADMITTING    Madelyn FlavorsSmith, Rondell A  ORDERING     Smith, Rondell A  REFERRING    Smith, Rondell A  cc:  ------------------------------------------------------------------- LV EF: 10%  ------------------------------------------------------------------- Indications:      CHF - 428.0.  ------------------------------------------------------------------- History:   PMH:  Ischemic cardiomyopathy. Acute renal failure. Mildly elevated troponin. DMT2. Obesity. Hypertension. Hyperlipidemia.  ------------------------------------------------------------------- Study Conclusions  - Left ventricle: The cavity size was moderately dilated. Wall   thickness was increased in a pattern of mild LVH. Systolic   function was severely reduced. The estimated ejection fraction   was 10%. Diffuse hypokinesis. Doppler parameters are consistent   with restrictive physiology, indicative of decreased left   ventricular diastolic compliance and/or increased left atrial   pressure. Doppler parameters are consistent with high ventricular   filling pressure. - Mitral valve: There was severe regurgitation. - Left atrium: The atrium was moderately dilated. - Right ventricle: Systolic function was mildly reduced.  Impressions:  - Definity used; severe global reduction in LV systolic function;   restrictive filling; moderate LVE; mild LVH; severe MR; moderate   LAE; mildly reduced RV function.  Assessment & Plan:   1. Acute  systolic congestive heart failure (HCC) EF of 10%, diffuse hypokinesis from 2-D echo of 09/2016 Euvolemic at this time Encouraged daily weight checks, limiting fluid intake to 2 L per day Scheduled to see cardiology on 11/12/16 Encouraged to apply for the financial discount as he will need so completed applications for medication assistance program for Brilinta and Entresto meanwhile he has been advised to obtain samples from his cardiologist.  2. Diabetes mellitus type 2 in obese (HCC) A1c of 7.6; not fully optimized Continue glipizide ADA diet, lifestyle modifications - Glucose (CBG) - HgB A1c  3. Essential hypertension, benign Uncontrolled with slight diastolic elevation Continue antihypertensives Low-sodium diet, weight loss  4. Renal insufficiency Avoid nephrotoxins CMET next week especially in the setting of Entresto   5. Other male erectile dysfunction Discussed indications and contraindications of use of phosphodiesterase inhibitors. He will need to run this by his cardiologist given his cardiac history and I am uncertain if he will need to be placed on nitrates.   He does have symptoms suspicious for obstructive sleep apnea and will need a sleep study once he has obtained: Financial discount.  No orders of the defined types were placed in this encounter.   Follow-up: Return in about 2 weeks (around 11/22/2016) for TCC;  follow up on CHF.   Jaclyn ShaggyEnobong Amao MD

## 2016-11-12 ENCOUNTER — Encounter (HOSPITAL_COMMUNITY): Payer: Self-pay

## 2016-11-12 ENCOUNTER — Ambulatory Visit (HOSPITAL_COMMUNITY)
Admit: 2016-11-12 | Discharge: 2016-11-12 | Disposition: A | Discharge status: Home / Self Care | Payer: Self-pay | Attending: Cardiology | Admitting: Cardiology

## 2016-11-12 VITALS — BP 124/98 | HR 87 | Wt 287.0 lb

## 2016-11-12 DIAGNOSIS — Z7982 Long term (current) use of aspirin: Secondary | ICD-10-CM | POA: Insufficient documentation

## 2016-11-12 DIAGNOSIS — E785 Hyperlipidemia, unspecified: Secondary | ICD-10-CM | POA: Insufficient documentation

## 2016-11-12 DIAGNOSIS — N529 Male erectile dysfunction, unspecified: Secondary | ICD-10-CM | POA: Insufficient documentation

## 2016-11-12 DIAGNOSIS — I252 Old myocardial infarction: Secondary | ICD-10-CM | POA: Insufficient documentation

## 2016-11-12 DIAGNOSIS — I255 Ischemic cardiomyopathy: Secondary | ICD-10-CM

## 2016-11-12 DIAGNOSIS — I110 Hypertensive heart disease with heart failure: Secondary | ICD-10-CM | POA: Insufficient documentation

## 2016-11-12 DIAGNOSIS — E669 Obesity, unspecified: Secondary | ICD-10-CM | POA: Insufficient documentation

## 2016-11-12 DIAGNOSIS — I2510 Atherosclerotic heart disease of native coronary artery without angina pectoris: Secondary | ICD-10-CM

## 2016-11-12 DIAGNOSIS — E119 Type 2 diabetes mellitus without complications: Secondary | ICD-10-CM | POA: Insufficient documentation

## 2016-11-12 DIAGNOSIS — I5022 Chronic systolic (congestive) heart failure: Secondary | ICD-10-CM

## 2016-11-12 LAB — BASIC METABOLIC PANEL
ANION GAP: 9 (ref 5–15)
BUN: 26 mg/dL — ABNORMAL HIGH (ref 6–20)
CO2: 24 mmol/L (ref 22–32)
Calcium: 9.7 mg/dL (ref 8.9–10.3)
Chloride: 102 mmol/L (ref 101–111)
Creatinine, Ser: 1.29 mg/dL — ABNORMAL HIGH (ref 0.61–1.24)
GFR calc Af Amer: >60 mL/min (ref >60)
GFR calc non Af Amer: >60 mL/min (ref >60)
GLUCOSE: 188 mg/dL — AB (ref 65–99)
POTASSIUM: 4.3 mmol/L (ref 3.5–5.1)
Sodium: 135 mmol/L (ref 135–145)

## 2016-11-12 LAB — DIGOXIN LEVEL: Digoxin Level: 0.6 ng/mL — ABNORMAL LOW (ref 0.8–2.0)

## 2016-11-12 MED ORDER — SILDENAFIL CITRATE 20 MG PO TABS: 20.0000 mg | ORAL_TABLET | ORAL | 0 refills | Status: DC | PRN

## 2016-11-12 MED ORDER — SACUBITRIL-VALSARTAN 49-51 MG PO TABS: 1.0000 | ORAL_TABLET | Freq: Two times a day (BID) | ORAL | 3 refills | Status: DC

## 2016-11-12 NOTE — Patient Instructions (Signed)
Increase Entresto to 49/51 mg Twice daily   Start Sildenafil 20 mg as needed   Labs today  Labs in 10 days  Your physician recommends that you schedule a follow-up appointment in: 3 weeks

## 2016-11-12 NOTE — Progress Notes (Signed)
CSW referred to assist with insurance options. [facility] and is following up this afternoon with [facility]. CSW discussed AHA and exploring possible insurance and subsidy for insurance through healthcare marketplace. CSW provided information on [place] discount program as well. Patient verbalizes understanding and will follow up as needed. CSW available if further needs arise. Lasandra BeechJackie Micha Erck, LCSW [phone]

## 2016-11-13 ENCOUNTER — Other Ambulatory Visit: Payer: Self-pay | Admitting: *Deleted

## 2016-11-13 MED ORDER — TICAGRELOR 90 MG PO TABS: 90.0000 mg | ORAL_TABLET | Freq: Two times a day (BID) | ORAL | 3 refills | Status: DC

## 2016-11-13 MED ORDER — SACUBITRIL-VALSARTAN 49-51 MG PO TABS: 1.0000 | ORAL_TABLET | Freq: Two times a day (BID) | ORAL | 3 refills | Status: DC

## 2016-11-13 NOTE — Telephone Encounter (Signed)
PRINTED FOR THE PASS PROGRAM 

## 2016-11-13 NOTE — Progress Notes (Signed)
PCP: Dr. Venetia NightAmao Cardiology: Dr. Antoine PocheHochrein HF Cardiology: Dr. Shirlee LatchMcLean  45 yo with history of CAD and ischemic cardiomyopathy presents for CHF clinic followup.  He initially had NSTEMI in 10/12 with DES to proximal LAD. At that time, EF was noted to be down to 15%.  Over time, EF improved to 50% (3/13 echo).  However, echo in 10/17 showed EF back down to 10% with severe functional MR.  He was taken for cardiac cath.  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  He had DES x 2 to LCx/OM2 and had a staged procedure with DES to distal RCA/ostial PDA.  RHC showed elevated filling pressure and low cardiac output.  While in the hospital, he was on milrinone gtt and was diuresed.  Eventually, we were able to titrate him off milrinone and discharged him home on a po regimen.   Today, he returns for followup.  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  He is trying to walk for exercise. No exertional dyspnea or chest pain.  He is able to lie flat without problems.  No PND, no lightheadedness or palpitations.    Labs (11/17): HCT 49.1, K 4.5, creatinine 1.44.   ECG (10/17): NSR, poor RWP, IVCD 114 msec  PMH:  1. CAD: NSTEMI 10/12 with DES to proximal LAD.  - LHC (10/25/16): subtotal occlusion LCx/OM2, 80% dRCA/ostial PCA => DES x 2 to LCx/OM2.  On 10/30, staged procedure with DES to dRCA/ostial PDA.   2. Chronic systolic CHF: Ischemic cardiomyopathy.   - 11/12 echo with EF 15%. - Echo (3/13) with EF 50% - Echo (10/16) with EF 35-40% - Echo (10/17) with EF 10%, severe MR (appears functional).  - RHC (10/17) with mean RA 20, PA 73/36, mean PCWP 28, CI 1.5 Fick/1.74 thermo 3. Type II diabetes 4. HTN 5. Hyperlipidemia 6. Obesity 7. OSA: Suspected.   Social History   Social History  . Marital status: Single    Spouse name: N/A  . Number of children: N/A  . Years of education: N/A   Occupational History  . Not on file.    Social History Main Topics  . Smoking status: Never Smoker  . Smokeless tobacco: Never Used  . Alcohol use Yes     Comment: OCCASIONAL  . Drug use: No  . Sexual activity: Yes   Other Topics Concern  . Not on file   Social History Narrative  . No narrative on file   Family History  Problem Relation Age of Onset  . Heart disease Mother     MOTHER HAD CABG   ROS: All systems reviewed and negative except as per HPI.   Current Outpatient Prescriptions  Medication Sig Dispense Refill  . aspirin 81 MG chewable tablet Chew 1 tablet (81 mg total) by mouth daily. 30 tablet 6  . atorvastatin (LIPITOR) 40 MG tablet Take 1 tablet (40 mg total) by mouth daily at 6 PM. 30 tablet 6  . carvedilol (COREG) 6.25 MG tablet Take 1 tablet (6.25 mg total) by mouth 2 (two) times daily. 60 tablet 6  . digoxin (LANOXIN) 0.125 MG tablet Take 1 tablet (0.125 mg total) by mouth daily. 30 tablet 6  . furosemide (LASIX) 20 MG tablet Take 1 tablet (20 mg total) by mouth daily. 30 tablet 6  . glipiZIDE (GLUCOTROL) 10 MG tablet Take 1 tablet (10 mg total) by mouth 2 (two) times  daily before a meal. 180 tablet 3  . omeprazole (PRILOSEC) 20 MG capsule Take 1 capsule (20 mg total) by mouth daily. 30 capsule 11  . spironolactone (ALDACTONE) 50 MG tablet Take 1 tablet (50 mg total) by mouth daily. 30 tablet 6  . sacubitril-valsartan (ENTRESTO) 49-51 MG Take 1 tablet by mouth 2 (two) times daily. 180 tablet 3  . sildenafil (REVATIO) 20 MG tablet Take 1 tablet (20 mg total) by mouth as needed (for erectile dysfunction). 10 tablet 0  . ticagrelor (BRILINTA) 90 MG TABS tablet Take 1 tablet (90 mg total) by mouth 2 (two) times daily. 180 tablet 3   No current facility-administered medications for this encounter.    BP (!) 124/98 (BP Location: Right Arm, Patient Position: Sitting, Cuff Size: Large)   Pulse 87   Wt 287 lb (130.2 kg)   SpO2 96%   BMI 31.53 kg/m  General: NAD Neck: Thick, no JVD, no thyromegaly or  thyroid nodule.  Lungs: Clear to auscultation bilaterally with normal respiratory effort. CV: Nondisplaced PMI.  Heart regular S1/S2, no S3/S4, 1/6 HSM apex.  No peripheral edema.  No carotid bruit.  Normal pedal pulses.  Abdomen: Soft, nontender, no hepatosplenomegaly, no distention.  Skin: Intact without lesions or rashes.  Neurologic: Alert and oriented x 3.  Psych: Normal affect. Extremities: No clubbing or cyanosis.  HEENT: Normal.   Assessment/Plan: 1. CAD: Cath in 10/17 after EF found to have significantly dropped.  He had DES x 2 to the LCx system and DES x 1 to RCA.  Hopefully this will allow some improvement in his LV systolic function, but doubt that this will normalize it.   - Continue ASA, Brilinta.  - Continue statin.  2. Chronic systolic CHF: EF 19%10% on 10/17 echo, down from 35-40% in 10/16.  Ischemic cardiomyopathy.  Now s/p PCI as above.  He had low output HF with volume overload on RHC down in 10/17.  He required milrinone and diuresis while in the hospital.  We were able to titrate off milrinone.  Currently much improved, NYHA class II symptoms with no significant volume overload.  - Continue Lasix 20 mg daily.  - Continue Coreg 6.25 mg bid and spironolactone 50 mg daily.  - Increase Entresto to 49/51 bid. BMET today and again in 10 days.  - He will need echo in 3 months for ICD evaluation.  He will not be a CRT candidate with narrow QRS.   - Continue digoxin, check level today.  - If he does not have significant improvement in function from recent PCI and medication adjustment, he may be a candidate for advanced therapies in the future, especially given low output noted during this admission. He needs to get health insurance, will ask our social worker to help him with this.  3. Suspect OSA: When he gets insurance, will need sleep study.   4. Erectile dysfunction: Will let him try low dose Viagra.  He understands risk of combination of NTG and Viagra.    Marca AnconaDalton  Sumayyah Custodio 11/13/2016

## 2016-11-13 NOTE — Telephone Encounter (Signed)
PRINTED FOR PASS PROGRAM REPRINT DUE TO NOT BEING PRINTED ON PRESCRIPTION PAPER.

## 2016-11-14 ENCOUNTER — Other Ambulatory Visit (HOSPITAL_COMMUNITY): Payer: Self-pay | Admitting: Pharmacist

## 2016-11-14 ENCOUNTER — Telehealth: Payer: Self-pay

## 2016-11-14 MED ORDER — SILDENAFIL CITRATE 20 MG PO TABS: 20.0000 mg | ORAL_TABLET | ORAL | 0 refills | Status: DC | PRN

## 2016-11-14 NOTE — Telephone Encounter (Signed)
This Case Manager placed call to patient to check status and to schedule Transitional Care follow-up appointment. Inquired about patient's status. Patient indicated he was doing well and denied chest pain and shortness of breath. Patient did note that he occasionally experiences "tightness in chest after eating." Patient wondering if prilosec is not working well. Will route to Dr. Jarold Song for consideration. Stressed to patient to go immediately to ED if he experiences chest tightness again, chest pain, or pain that radiates down arm, jaw, or neck. Patient verbalized understanding.  Patient indicated he met with Patient Assistance Coordinator after office visit on 11/08/16, and Brilinta and Entresto patient assistance applications have been submitted. Patient indicated he is aware he still needs to meet with Financial Counselor to determine if he benefits for [employer] or [employer]. Patient indicated Dr. Marigene Ehlers increased his Entresto dose to 49-51 mg twice daily on 11/12/16. Patient indicated he was given samples of Entresto and Brilinta at Cardiology appointment.  Informed patient that a Transitional Care Clinic follow-up appointment needs to be scheduled, and patient agreeable. Patient indicated Fridays work best for him so appointment scheduled on 11/29/16 at 1400. No additional needs/concerns identified.

## 2016-11-15 NOTE — Telephone Encounter (Signed)
This Case Manager placed call to patient to inform him of Dr. Jen MowAmao's instructions to take Prilosec first thing in the morning and to avoid lying down for up to two hours after meals. Patient verbalized understanding. Patient questioned when he has to get [facility]work [facility]. Informed patient he is scheduled for labwork on 11/25/16 at 1600. Patient verbalized understanding.

## 2016-11-15 NOTE — Telephone Encounter (Signed)
Take Prilosec first thing in the morning; avoid recumbent position up to 2 hours after meals.

## 2016-11-20 ENCOUNTER — Telehealth: Payer: Self-pay

## 2016-11-20 NOTE — Telephone Encounter (Signed)
Call placed to the patient to check on his status. He stated that he is doing " good."  He reported that he has been checking his weight first thing every morning. This morning his weight was 282 lbs and is has been 282 lbs for 1.5 weeks. He noted that he understands when to report a weight gain to his provider.   He stated that he has an appointment for lab work for cardiology on 11/27 @ 1600 and he confirmed his appointment with the [facility] ([facility]) on 11/29/16 @ 1400.  He said that he needs to bring 3 months of pay stubs to the pharmacy. He met with Samul Dada Medon , and has applied for the PASS program for his brilinta and entresto.  He said that he still needs to apply for the [facility] financial assistance, but there have not been any financial counseling appointments available. This CM to check with the [facility] Financial Counselor to inquire if the patient needs to meet with her for the [facility] of if he can drop off the required documents for her review.  He reported no other questions/concerns at this time.   This CM spoke to Johnnette Barrios, [facility] Financial Counselor, who stated that she will need to meet with the patient in person in order to complete his orange card application. She noted that he can bring his documentation with him to his [facility] appointment on 11/29/16 and if there is a cancellation, she may be able to meet with him. He also has the option of coming to the clinic for financial counseling walk in hours  On 11/29/16 but only the first 10 people in line would be seen.     Attempted to contact the patient to inform him of the information about the [employer] shared by Bud Face, Development worker, community. Call placed to # [phone] and a HIPAA compliant voicemail message was left requesting a call back to # [phone] or [phone].

## 2016-11-25 ENCOUNTER — Inpatient Hospital Stay (HOSPITAL_COMMUNITY): Admission: RE | Admit: 2016-11-25 | Payer: Self-pay | Source: Ambulatory Visit

## 2016-11-28 ENCOUNTER — Ambulatory Visit (HOSPITAL_COMMUNITY)
Admission: RE | Admit: 2016-11-28 | Discharge: 2016-11-28 | Disposition: A | Discharge status: Home / Self Care | Payer: Self-pay | Source: Ambulatory Visit | Attending: Internal Medicine | Admitting: Internal Medicine

## 2016-11-28 ENCOUNTER — Telehealth: Payer: Self-pay

## 2016-11-28 ENCOUNTER — Other Ambulatory Visit (HOSPITAL_COMMUNITY): Payer: Self-pay | Admitting: Pharmacist

## 2016-11-28 DIAGNOSIS — I5022 Chronic systolic (congestive) heart failure: Secondary | ICD-10-CM | POA: Insufficient documentation

## 2016-11-28 LAB — BASIC METABOLIC PANEL
Anion gap: 10 (ref 5–15)
BUN: 19 mg/dL (ref 6–20)
CALCIUM: 9.3 mg/dL (ref 8.9–10.3)
CO2: 26 mmol/L (ref 22–32)
Chloride: 104 mmol/L (ref 101–111)
Creatinine, Ser: 1.43 mg/dL — ABNORMAL HIGH (ref 0.61–1.24)
GFR calc Af Amer: >60 mL/min (ref >60)
GFR, EST NON AFRICAN AMERICAN: 59 mL/min — AB (ref >60)
GLUCOSE: 170 mg/dL — AB (ref 65–99)
Potassium: 4.1 mmol/L (ref 3.5–5.1)
Sodium: 140 mmol/L (ref 135–145)

## 2016-11-28 MED ORDER — FUROSEMIDE 20 MG PO TABS: 20.0000 mg | ORAL_TABLET | Freq: Every day | ORAL | 6 refills | Status: DC

## 2016-11-28 MED ORDER — OMEPRAZOLE 20 MG PO CPDR: 20.0000 mg | DELAYED_RELEASE_CAPSULE | Freq: Every day | ORAL | 11 refills | Status: DC

## 2016-11-28 MED ORDER — ATORVASTATIN CALCIUM 40 MG PO TABS: 40.0000 mg | ORAL_TABLET | Freq: Every day | ORAL | 6 refills | Status: DC

## 2016-11-28 MED ORDER — DIGOXIN 125 MCG PO TABS: 0.1250 mg | ORAL_TABLET | Freq: Every day | ORAL | 6 refills | Status: DC

## 2016-11-28 MED ORDER — SPIRONOLACTONE 50 MG PO TABS: 50.0000 mg | ORAL_TABLET | Freq: Every day | ORAL | 6 refills | Status: DC

## 2016-11-28 MED ORDER — CARVEDILOL 6.25 MG PO TABS: 6.2500 mg | ORAL_TABLET | Freq: Two times a day (BID) | ORAL | 6 refills | Status: DC

## 2016-11-28 MED FILL — ATORVASTATIN 40 MG TABLET: 40 | 30 days supply | Qty: 30 | Fill #0

## 2016-11-28 MED FILL — DIGITEK 125 MCG TABLET: 125 | 30 days supply | Qty: 30 | Fill #0

## 2016-11-28 MED FILL — FUROSEMIDE 20 MG TABLET: 20 | 30 days supply | Qty: 30 | Fill #0

## 2016-11-28 MED FILL — OMEPRAZOLE DR 20 MG CAPSULE: 20 | 30 days supply | Qty: 30 | Fill #0

## 2016-11-28 MED FILL — CARVEDILOL 6.25 MG TABLET: 6.25 | 30 days supply | Qty: 60 | Fill #0

## 2016-11-28 MED FILL — SPIRONOLACTONE 50 MG TABLET: 50 | 30 days supply | Qty: 30 | Fill #0

## 2016-11-28 NOTE — Telephone Encounter (Signed)
This Case Manager placed call to patient to remind him of upcoming [facility] appointment on 11/29/16 at 1400 with Dr. Venetia NightAmao. Patient aware of appointment; drives to appointments.   Patient uninsured and would benefit from meeting with Artistinancial Counselor. Informed patient of Financial Counselor walk-in hours, and patient indicated he will be unable to make it to walk-in hours due to work. Informed him that Financial Counselor has indicated if she has a cancellation on 11/29/16 she would be able to see him. Informed patient of documentation to bring when meeting with Financial Counselor. Patient verbalized understanding. Patient indicated he plans to bring 3 months of bank statements to [place], Pharmacist, hospitalatient Assistance Coordinator for PASS application for [employer]. Patient reminded to bring medications to upcoming appointment. He indicated he will bring a medication list to his appointment. He stated he needed refills of spironolactone and digoxin. EMR reviewed and informed patient he has refills available on script. Patient verbalized understanding. No additional needs/concerns identified.

## 2016-11-29 ENCOUNTER — Encounter: Payer: Self-pay | Admitting: Family Medicine

## 2016-11-29 ENCOUNTER — Ambulatory Visit: Payer: Self-pay | Attending: Family Medicine | Admitting: Family Medicine

## 2016-11-29 VITALS — BP 131/88 | HR 78 | Temp 98.2°F | Ht 78.0 in | Wt 287.2 lb

## 2016-11-29 DIAGNOSIS — E785 Hyperlipidemia, unspecified: Secondary | ICD-10-CM | POA: Insufficient documentation

## 2016-11-29 DIAGNOSIS — I2510 Atherosclerotic heart disease of native coronary artery without angina pectoris: Secondary | ICD-10-CM

## 2016-11-29 DIAGNOSIS — I5022 Chronic systolic (congestive) heart failure: Secondary | ICD-10-CM

## 2016-11-29 DIAGNOSIS — E349 Endocrine disorder, unspecified: Secondary | ICD-10-CM

## 2016-11-29 DIAGNOSIS — Z79890 Hormone replacement therapy (postmenopausal): Secondary | ICD-10-CM

## 2016-11-29 DIAGNOSIS — I255 Ischemic cardiomyopathy: Secondary | ICD-10-CM | POA: Insufficient documentation

## 2016-11-29 DIAGNOSIS — I252 Old myocardial infarction: Secondary | ICD-10-CM | POA: Insufficient documentation

## 2016-11-29 DIAGNOSIS — I25119 Atherosclerotic heart disease of native coronary artery with unspecified angina pectoris: Secondary | ICD-10-CM | POA: Insufficient documentation

## 2016-11-29 DIAGNOSIS — E119 Type 2 diabetes mellitus without complications: Secondary | ICD-10-CM | POA: Insufficient documentation

## 2016-11-29 DIAGNOSIS — E1169 Type 2 diabetes mellitus with other specified complication: Secondary | ICD-10-CM

## 2016-11-29 DIAGNOSIS — I110 Hypertensive heart disease with heart failure: Secondary | ICD-10-CM | POA: Insufficient documentation

## 2016-11-29 DIAGNOSIS — Z79899 Other long term (current) drug therapy: Secondary | ICD-10-CM

## 2016-11-29 DIAGNOSIS — Z955 Presence of coronary angioplasty implant and graft: Secondary | ICD-10-CM | POA: Insufficient documentation

## 2016-11-29 DIAGNOSIS — R972 Elevated prostate specific antigen [PSA]: Secondary | ICD-10-CM | POA: Insufficient documentation

## 2016-11-29 DIAGNOSIS — E669 Obesity, unspecified: Secondary | ICD-10-CM

## 2016-11-29 DIAGNOSIS — Z5181 Encounter for therapeutic drug level monitoring: Secondary | ICD-10-CM

## 2016-11-29 DIAGNOSIS — Z7982 Long term (current) use of aspirin: Secondary | ICD-10-CM | POA: Insufficient documentation

## 2016-11-29 DIAGNOSIS — R634 Abnormal weight loss: Secondary | ICD-10-CM | POA: Insufficient documentation

## 2016-11-29 LAB — CBC WITH DIFFERENTIAL/PLATELET
BASOS PCT: 0 %
Basophils Absolute: 0 cells/uL (ref 0–200)
EOS ABS: 364 {cells}/uL (ref 15–500)
Eosinophils Relative: 4 %
HEMATOCRIT: 43.6 % (ref 38.5–50.0)
Hemoglobin: 14.4 g/dL (ref 13.2–17.1)
LYMPHS PCT: 27 %
Lymphs Abs: 2457 cells/uL (ref 850–3900)
MCH: 26.3 pg — ABNORMAL LOW (ref 27.0–33.0)
MCHC: 33 g/dL (ref 32.0–36.0)
MCV: 79.7 fL — AB (ref 80.0–100.0)
MONO ABS: 546 {cells}/uL (ref 200–950)
MONOS PCT: 6 %
MPV: 11.5 fL (ref 7.5–12.5)
Neutro Abs: 5733 cells/uL (ref 1500–7800)
Neutrophils Relative %: 63 %
PLATELETS: 204 10*3/uL (ref 140–400)
RBC: 5.47 MIL/uL (ref 4.20–5.80)
RDW: 15.1 % — AB (ref 11.0–15.0)
WBC: 9.1 10*3/uL (ref 3.8–10.8)

## 2016-11-29 LAB — GLUCOSE, POCT (MANUAL RESULT ENTRY): POC Glucose: 124 mg/dl — AB (ref 70–99)

## 2016-11-29 MED ORDER — TESTOSTERONE CYPIONATE 200 MG/ML IM SOLN: 100.0000 mg | INTRAMUSCULAR | 3 refills | Status: DC

## 2016-11-29 NOTE — Progress Notes (Signed)
Transitional care clinic  Date of telephone encounter: 10/31/16  Hospitalization dates: 10/22/16 through 10/30/16  Subjective:  Patient ID: [name], male    DOB: 08-30-1971  Age: 45 y.o. MRN: [id number]  CC: Hospitalization Follow-up; Congestive Heart Failure; low testosterone; and Diabetes   HPI Glenn McgeeHarold Grothaus presents for follow-up visit. Medical history is significant for ischemic cardiomyopathy (EF 10% from 09/2016 which is down compared to 35-40% nine months prior), coronary artery disease (s/p DES x2, Currently on Brilinta on aspirin) , type 2 diabetes mellitus (A1c 7.6), hypertension, obesity.  He was seen by cardiology 2 weeks ago and the dose of his Sherryll Burgerntresto was increased. He had also complained of erectile dysfunction for which he was commenced on low-dose sildenafil. Of note he had labs from 06/2016 which revealed low testosterone and he is wanting to discuss testosterone replacement therapy.  He checks his weight daily (has gained 4 pounds which he attributes to weight gain over the Thanksgiving) denies chest pains, shortness of breath, pedal edema and has no other concerns at this time.  He is still working on obtaining the [employer] financial discount so we can refer him for a sleep study.  Past Medical History:  Diagnosis Date  . CAD (coronary artery disease)    NSTEMI 10/2011: LHC 11/04/11: pLAD 90%, mLAD 60-70%, small D2 sub totally occluded at ostium, small OM1 90% ostial, 90% mid, mOM2 30%, oPL 80%, RCA 30%, dPDA 80%, EF 20% with ant AK.  PCI:  Promus DES to pLAD.  [name]. Chronic systolic heart failure (HCC)   . DM2 (diabetes mellitus, type 2) (HCC)   . HTN (hypertension)   . Hyperlipidemia   . Ischemic cardiomyopathy    Echo 11/03/11: mod LVH, mild focal basal septal hypertrophy, EF 15%, grade 2 diast dysfxn, mild MR, mild to mod LAE, mild RVE, mild to mod reduced RVSF.  EF 3/5 50% by echo  . Obesity     Past Surgical History:  Procedure Laterality Date  .  CARDIAC CATHETERIZATION N/A 10/25/2016   Procedure: Right/Left Heart Cath and Coronary Angiography;  Surgeon: Marykay Lexavid W Harding, MD;  [facility];  Service: Cardiovascular;  Laterality: N/A;  . CARDIAC CATHETERIZATION N/A 10/25/2016   Procedure: Coronary Stent Intervention;  Surgeon: Marykay Lexavid W Harding, MD;  [facility];  Service: Cardiovascular;  Laterality: N/A;  . CARDIAC CATHETERIZATION N/A 10/28/2016   Procedure: Coronary Stent Intervention;  Surgeon: Kathleene Hazelhristopher D McAlhany, MD;  Location: [facility] INV[facility] CV LAB;  Service: Cardiovascular;  Laterality: N/A;  . LEFT AND RIGHT HEART CATHETERIZATION WITH CORONARY ANGIOGRAM N/A 11/04/2011   Procedure: LEFT AND RIGHT HEART CATHETERIZATION WITH CORONARY ANGIOGRAM;  Surgeon: Rollene RotundaJames Hochrein, MD;  [facility];  Service: Cardiovascular;  Laterality: N/A;  . NONE    . PERCUTANEOUS CORONARY STENT INTERVENTION (PCI-S)  11/04/2011   Procedure: PERCUTANEOUS CORONARY STENT INTERVENTION (PCI-S);  Surgeon: Tonny BollmanMichael Cooper, MD;  [facility];  Service: Cardiovascular;;     Outpatient Medications Prior to Visit  Medication Sig Dispense Refill  . aspirin 81 MG chewable tablet Chew 1 tablet (81 mg total) by mouth daily. 30 tablet 6  . atorvastatin (LIPITOR) 40 MG tablet Take 1 tablet (40 mg total) by mouth daily at 6 PM. 30 tablet 6  . carvedilol (COREG) 6.25 MG tablet Take 1 tablet (6.25 mg total) by mouth 2 (two) times daily. 60 tablet 6  . digoxin (LANOXIN) 0.125 MG tablet Take 1 tablet (0.125 mg total) by mouth  daily. 30 tablet 6  . furosemide (LASIX) 20 MG tablet Take 1 tablet (20 mg total) by mouth daily. 30 tablet 6  . glipiZIDE (GLUCOTROL) 10 MG tablet Take 1 tablet (10 mg total) by mouth 2 (two) times daily before a meal. 180 tablet 3  . omeprazole (PRILOSEC) 20 MG capsule Take 1 capsule (20 mg total) by mouth daily. 30 capsule 11  . sacubitril-valsartan (ENTRESTO) 49-51 MG Take 1 tablet by mouth 2 (two) times  daily. 180 tablet 3  . sildenafil (REVATIO) 20 MG tablet Take 1 tablet (20 mg total) by mouth as needed (for erectile dysfunction). 30 tablet 0  . spironolactone (ALDACTONE) 50 MG tablet Take 1 tablet (50 mg total) by mouth daily. 30 tablet 6  . ticagrelor (BRILINTA) 90 MG TABS tablet Take 1 tablet (90 mg total) by mouth 2 (two) times daily. 180 tablet 3   No facility-administered medications prior to visit.     ROS Review of Systems  Constitutional: Negative for activity change and appetite change.  HENT: Negative for sinus pressure and sore throat.   Eyes: Negative for visual disturbance.  Respiratory: Negative for cough, chest tightness and shortness of breath.   Cardiovascular: Negative for chest pain and leg swelling.  Gastrointestinal: Negative for abdominal distention, abdominal pain, constipation and diarrhea.  Endocrine: Negative.   Genitourinary: Negative for dysuria.  Musculoskeletal: Negative for joint swelling and myalgias.  Skin: Negative for rash.  Allergic/Immunologic: Negative.   Neurological: Negative for weakness, light-headedness and numbness.  Psychiatric/Behavioral: Negative for dysphoric mood and suicidal ideas.    Objective:  BP 131/88 (BP Location: Right Arm, Patient Position: Sitting, Cuff Size: Large)   Pulse 78   Temp 98.2 F (36.8 C) (Oral)   Ht 6\' 6"  (1.981 m)   Wt 287 lb 3.2 oz (130.3 kg)   SpO2 97%   BMI 33.19 kg/m   BP/Weight 11/29/2016 11/12/2016 11/08/2016  Systolic BP 131 124 139  Diastolic BP 88 98 99  Wt. (Lbs) 287.2 287 282  BMI 33.19 31.53 30.98      Physical Exam  Constitutional: He is oriented to person, place, and time. He appears well-developed and well-nourished.  Neck: No JVD present.  Cardiovascular: Normal rate, normal heart sounds and intact distal pulses.   No murmur heard. Pulmonary/Chest: Effort normal and breath sounds normal. He has no wheezes. He has no rales. He exhibits no tenderness.  Abdominal: Soft. Bowel  sounds are normal. He exhibits no distension and no mass. There is no tenderness.  Musculoskeletal: Normal range of motion.  Neurological: He is alert and oriented to person, place, and time.  Skin: Skin is warm and dry.  Psychiatric: He has a normal mood and affect.    CMP Latest Ref Rng & Units 11/28/2016 11/12/2016 10/30/2016  Glucose 65 - 99 mg/dL 098(J170(H) 191(Y188(H) 782(N182(H)  BUN 6 - 20 mg/dL 19 56(O26(H) 19  Creatinine 0.61 - 1.24 mg/dL 1.30(Q1.43(H) 6.57(Q1.29(H) 4.69(G1.44(H)  Sodium 135 - 145 mmol/L 140 135 136  Potassium 3.5 - 5.1 mmol/L 4.1 4.3 4.5  Chloride 101 - 111 mmol/L 104 102 101  CO2 22 - 32 mmol/L 26 24 26   Calcium 8.9 - 10.3 mg/dL 9.3 9.7 9.5  Total Protein 6.5 - 8.1 g/dL - - -  Total Bilirubin 0.3 - 1.2 mg/dL - - -  Alkaline Phos 38 - 126 U/L - - -  AST 15 - 41 U/L - - -  ALT 17 - 63 U/L - - -  Lab Results  Component Value Date   HGBA1C 7.6 11/08/2016    Assessment & Plan:   1. Diabetes mellitus type 2 in obese (HCC) A1c of 7.6 Not fully optimized but will make no regimen changes today ADA diet, lifestyle modifications - Glucose (CBG)  2. Testosterone deficiency We'll commenced on testosterone shots Discussed side effects  3. Encounter for monitoring testosterone replacement therapy - CBC with Differential - PSA  4. Chronic systolic heart failure (HCC) EF 10% from 2d echo of 09/2016  Euvolemic at this time Continue Lasix, Entresto, spironolactone, carvedilol, digoxin Daily weight checks, limit fluid intake to less than 2 L per day Keep appointment with cardiology  5. Atherosclerosis of native coronary artery of native heart, angina presence unspecified S/p post DES 2 Continue dual antiplatelet therapy with [place] aspirin Continue statin Risk factor modification, weight loss.   Meds ordered this encounter  Medications  . testosterone cypionate (DEPO-TESTOSTERONE) 200 MG/ML injection    Sig: Inject 0.5 mLs (100 mg total) into the muscle every 28 (twenty-eight)  days.    Dispense:  10 mL    Refill:  3    Follow-up: Return in about 3 months (around 02/27/2017) for Follow-up on diabetes mellitus.   Jaclyn Shaggy MD

## 2016-11-29 NOTE — Patient Instructions (Signed)
Testosterone Replacement Therapy Testosterone replacement therapy (TRT) is used to treat men who have a low testosterone level (hypogonadism). Testosterone is a male hormone that is produced in the testicles. It is responsible for typically male characteristics and for maintaining a man's sex drive and the ability to get an erection. Testosterone also supports bone and muscle health. TRT can be a gel, liquid, or patch that you put on your skin. It can also be in the form of a tablet or an injection. In some cases, your health care provider may insert long-acting pellets under your skin. In most men, the level of testosterone starts to decline gradually after age 45. Low testosterone can also be caused by certain medical conditions, medicines, and obesity. Your health care provider can diagnose hypogonadism with at least two blood tests that are done early in the morning. Low testosterone may not need to be treated. TRT is usually a choice that you make with your health care provider. Your health care provider may recommend TRT if you have low testosterone that is causing symptoms, such as:  Low sex drive.  Erection problems.  Breast enlargement.  Loss of body hair.  Weak muscles or bones.  Shrinking testicles.  Increased body fat.  Low energy.  Hot flashes.  Depression.  Decreased work performance. TRT is a lifetime treatment. If you stop treatment, your testosterone will drop, and your symptoms may return. What are the risks? Testosterone replacement therapy may have side effects, including:  Lower sperm count.  Skin irritation at the application or injection site.  Mouth irritation if you take an oral tablet.  Acne.  Swelling of your legs or feet.  Tender breasts.  Dizziness.  Sleep disturbance.  Mood swings.  Possible increased risk of stroke or heart attack. Testosterone replacement therapy may also increase your risk for prostate cancer or male breast cancer. You  should not use TRT if you have either of those conditions. Your health care provider also may not recommend TRT if:  You are suspected of having prostate cancer.  You want to father a child.  You have a high number of red blood cells.  You have untreated sleep apnea.  You have a very large prostate. Supplies needed:  Your health care provider will prescribe the testosterone gel, solution, or medicine that you need. If your health care provider teaches you to do self-injections at home, you will also need:  Your medicine vial.  Disposable needles and syringes.  Alcohol swabs.  A needle disposal container.  Adhesive bandages. How to use testosterone replacement therapy Your health care provider will help you find the TRT option that will work best for you based on your preference, the side effects, and the cost. You may:  Rub testosterone gel on your upper arm or shoulder every day after a shower. This is the most common type of TRT. Do not let women or children come in contact with the gel.  Apply a testosterone solution under your arms once each day.  Place a testosterone patch on your skin once each day.  Dissolve a testosterone tablet in your mouth twice each day.  Have a testosterone pellet inserted under your skin by your health care provider. This will be replaced every 3-6 months.  Use testosterone nasal spray three times each day.  Get testosterone injections. For some types of testosterone, your health care provider will give you this injection. With other types of testosterone, you may be taught to give injections to yourself.   The frequency of injections may vary based on the type of testosterone that you receive. Follow these instructions at home:  Take over-the-counter and prescription medicines only as told by your health care provider.  Lose weight if you are overweight. Ask your health care provider to help you start a healthy diet and exercise program to  reach and maintain a healthy weight.  Work with your health care provider to treat other medical conditions that may lower your testosterone. These include obesity, high blood pressure, high cholesterol, diabetes, liver disease, kidney disease, and sleep apnea.  Keep all follow-up visits as told by your health care provider. This is important. General recommendations  Discuss all risks and benefits with your health care provider before starting therapy.  Work with your health care provider to check your prostate health and do blood testing before you start therapy.  Do not use any testosterone replacement therapies that are not prescribed by your health care provider or not approved for use in the [place]  Do not use TRT for bodybuilding or to improve sexual performance. TRT should be used only to treat symptoms of low testosterone.  Return for all repeat prostate checks and blood tests during therapy, as told by your health care provider. Where to find more information: Learn more about testosterone replacement therapy from:  American Urological Foundation: www.urologyhealth.org/urologic-conditions/low-testosterone-(hypogonadism)  Endocrine Society: www.hormone.org/diseases-and-conditions/mens-health/hypogonadism Contact a health care provider if:  You have side effects from your testosterone replacement therapy.  You continue to have symptoms of low testosterone during treatment.  You develop new symptoms during treatment. Summary  Testosterone replacement therapy is only for men who have low testosterone as determined by blood testing and who have symptoms of low testosterone.  Testosterone replacement therapy should be prescribed only by a health care provider and should be used under the supervision of a health care provider.  You may not be able to take testosterone if you have certain medical conditions, including prostate cancer, male breast cancer, or heart  disease.  Testosterone replacement therapy may have side effects and may make some medical conditions worse.  Talk with your health care provider about all the risks and benefits before you start therapy. This information is not intended to replace advice given to you by your health care provider. Make sure you discuss any questions you have with your health care provider. Document Released: 09/05/2016 Document Revised: 09/05/2016 Document Reviewed: 09/05/2016 Elsevier Interactive Patient Education  2017 Elsevier Inc.  

## 2016-11-30 LAB — PSA: PSA: 0.5 ng/mL (ref <=4.0)

## 2016-12-05 ENCOUNTER — Ambulatory Visit (HOSPITAL_COMMUNITY)
Admission: RE | Admit: 2016-12-05 | Discharge: 2016-12-05 | Disposition: A | Discharge status: Home / Self Care | Payer: Self-pay | Source: Ambulatory Visit | Attending: Internal Medicine | Admitting: Internal Medicine

## 2016-12-05 VITALS — BP 134/86 | HR 82 | Wt 288.2 lb

## 2016-12-05 DIAGNOSIS — E669 Obesity, unspecified: Secondary | ICD-10-CM | POA: Insufficient documentation

## 2016-12-05 DIAGNOSIS — N529 Male erectile dysfunction, unspecified: Secondary | ICD-10-CM | POA: Insufficient documentation

## 2016-12-05 DIAGNOSIS — E119 Type 2 diabetes mellitus without complications: Secondary | ICD-10-CM | POA: Insufficient documentation

## 2016-12-05 DIAGNOSIS — Z7982 Long term (current) use of aspirin: Secondary | ICD-10-CM | POA: Insufficient documentation

## 2016-12-05 DIAGNOSIS — E785 Hyperlipidemia, unspecified: Secondary | ICD-10-CM | POA: Insufficient documentation

## 2016-12-05 DIAGNOSIS — I110 Hypertensive heart disease with heart failure: Secondary | ICD-10-CM | POA: Insufficient documentation

## 2016-12-05 DIAGNOSIS — I5022 Chronic systolic (congestive) heart failure: Secondary | ICD-10-CM | POA: Insufficient documentation

## 2016-12-05 DIAGNOSIS — I252 Old myocardial infarction: Secondary | ICD-10-CM | POA: Insufficient documentation

## 2016-12-05 DIAGNOSIS — G4733 Obstructive sleep apnea (adult) (pediatric): Secondary | ICD-10-CM | POA: Insufficient documentation

## 2016-12-05 DIAGNOSIS — I255 Ischemic cardiomyopathy: Secondary | ICD-10-CM | POA: Insufficient documentation

## 2016-12-05 DIAGNOSIS — I2510 Atherosclerotic heart disease of native coronary artery without angina pectoris: Secondary | ICD-10-CM | POA: Insufficient documentation

## 2016-12-05 NOTE — Progress Notes (Signed)
PCP: Dr. Venetia NightAmao Cardiology: Dr. Antoine PocheHochrein HF Cardiology: Dr. Shirlee LatchMcLean  45 yo with history of CAD and ischemic cardiomyopathy presents for CHF clinic followup.  He initially had NSTEMI in 10/12 with DES to proximal LAD. At that time, EF was noted to be down to 15%.  Over time, EF improved to 50% (3/13 echo).  However, echo in 10/17 showed EF back down to 10% with severe functional MR.  He was taken for cardiac cath.  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  He had DES x 2 to LCx/OM2 and had a staged procedure with DES to distal RCA/ostial PDA.  RHC showed elevated filling pressure and low cardiac output.  While in the hospital, he was on milrinone gtt and was diuresed.  Eventually, we were able to titrate him off milrinone and discharged him home on a po regimen.   Today, he returns for followup.  Last visit entresto was increased to 49/51 mg twice a day. Overall feeling good. Able to to walk up 10 steps without difficulty. Denies SOB/PND/Orthopnea/CP . Weight at home 283 pounds. Taking all medications. He continues to work full time.   Labs (11/17): HCT 49.1, K 4.5, creatinine 1.44.  Labs 11/303/2017: K 4.1 Creatinine 1.43   ECG (10/17): NSR, poor RWP, IVCD 114 msec  PMH:  1. CAD: NSTEMI 10/12 with DES to proximal LAD.  - LHC (10/25/16): subtotal occlusion LCx/OM2, 80% dRCA/ostial PCA => DES x 2 to LCx/OM2.  On 10/30, staged procedure with DES to dRCA/ostial PDA.   2. Chronic systolic CHF: Ischemic cardiomyopathy.   - 11/12 echo with EF 15%. - Echo (3/13) with EF 50% - Echo (10/16) with EF 35-40% - Echo (10/17) with EF 10%, severe MR (appears functional).  - RHC (10/17) with mean RA 20, PA 73/36, mean PCWP 28, CI 1.5 Fick/1.74 thermo 3. Type II diabetes 4. HTN 5. Hyperlipidemia 6. Obesity 7. OSA: Suspected.   Social History   Social History  . Marital status: Single    Spouse name: N/A  . Number of children: N/A  . Years of education: N/A    Occupational History  . Not on file.   Social History Main Topics  . Smoking status: Never Smoker  . Smokeless tobacco: Never Used  . Alcohol use 0.6 oz/week    1 Shots of liquor per week     Comment: OCCASIONAL  . Drug use: No  . Sexual activity: Yes   Other Topics Concern  . Not on file   Social History Narrative  . No narrative on file   Family History  Problem Relation Age of Onset  . Heart disease Mother     MOTHER HAD CABG   ROS: All systems reviewed and negative except as per HPI.   Current Outpatient Prescriptions  Medication Sig Dispense Refill  . aspirin 81 MG chewable tablet Chew 1 tablet (81 mg total) by mouth daily. 30 tablet 6  . atorvastatin (LIPITOR) 40 MG tablet Take 1 tablet (40 mg total) by mouth daily at 6 PM. 30 tablet 6  . carvedilol (COREG) 6.25 MG tablet Take 1 tablet (6.25 mg total) by mouth 2 (two) times daily. 60 tablet 6  . digoxin (LANOXIN) 0.125 MG tablet Take 1 tablet (0.125 mg total) by mouth daily. 30 tablet 6  . furosemide (LASIX) 20 MG tablet Take 1 tablet (20 mg total) by mouth daily. 30 tablet 6  . glipiZIDE (GLUCOTROL) 10 MG tablet Take 1 tablet (  10 mg total) by mouth 2 (two) times daily before a meal. 180 tablet 3  . omeprazole (PRILOSEC) 20 MG capsule Take 1 capsule (20 mg total) by mouth daily. 30 capsule 11  . sacubitril-valsartan (ENTRESTO) 49-51 MG Take 1 tablet by mouth 2 (two) times daily. 180 tablet 3  . sildenafil (REVATIO) 20 MG tablet Take 1 tablet (20 mg total) by mouth as needed (for erectile dysfunction). 30 tablet 0  . spironolactone (ALDACTONE) 50 MG tablet Take 1 tablet (50 mg total) by mouth daily. 30 tablet 6  . testosterone cypionate (DEPO-TESTOSTERONE) 200 MG/ML injection Inject 0.5 mLs (100 mg total) into the muscle every 28 (twenty-eight) days. 10 mL 3  . ticagrelor (BRILINTA) 90 MG TABS tablet Take 1 tablet (90 mg total) by mouth 2 (two) times daily. 180 tablet 3   No current facility-administered medications  for this encounter.    BP 134/86   Pulse 82   Wt 288 lb 3.2 oz (130.7 kg)   SpO2 97%   BMI 33.30 kg/m   Filed Weights   12/05/16 1436  Weight: 288 lb 3.2 oz (130.7 kg)   General: NAD. Ambulated in clinic without difficulty Neck: Thick, no JVD, no thyromegaly or thyroid nodule.  Lungs: Clear to auscultation bilaterally with normal respiratory effort. CV: Nondisplaced PMI.  Heart regular S1/S2, no S3/S4, 1/6 HSM apex.  No peripheral edema.  No carotid bruit.  Normal pedal pulses.  Abdomen: Soft, nontender, no hepatosplenomegaly, no distention.  Skin: Intact without lesions or rashes.  Neurologic: Alert and oriented x 3.  Psych: Normal affect. Extremities: No clubbing or cyanosis.  HEENT: Normal.   Assessment/Plan: 1. CAD: Cath in 10/17 after EF found to have significantly dropped.  He had DES x 2 to the LCx system and DES x 1 to RCA.  Hopefully this will allow some improvement in his LV systolic function, but doubt that this will normalize it.  Continue ASA, Brilinta.  - Continue statin.  2. Chronic systolic CHF: EF 98%10% on 10/17 echo, down from 35-40% in 10/16.  Ischemic cardiomyopathy.  Now s/p PCI as above.  He had low output HF with volume overload on RHC down in 10/17.  He required milrinone and diuresis while in the hospital.  We were able to titrate off milrinone.    NYHA class II. Volume status stable. Continue Lasix 20 mg daily.  - Continue Coreg 6.25 mg bid and spironolactone 50 mg daily.  - Increase Entresto to 97/103 twice a day.  - He will need echo in 3 months for ICD evaluation.  He will not be a CRT candidate with narrow QRS.   - Continue digoxin, recent dig level ok.   - If he does not have significant improvement in function from recent PCI and medication adjustment, he may be a candidate for advanced therapies in the future, especially given low output noted during this admission. He needs to get health insurance, will ask our social worker to help him with this.  3.  Suspect OSA: When he gets insurance, will need sleep study.   4. Erectile dysfunction: Will let him try low dose Viagra.  He understands risk of combination of NTG and Viagra.    Check BMET in 10 days. Follow up 4 weeks.    Amy Clegg NP-C  12/05/2016

## 2016-12-05 NOTE — Patient Instructions (Signed)
INCREASE Entresto 97/103 mg tablet twice daily. Can double up on your 49/51 mg tablets (Take 2 twice daily).  Return in 10-14 days for lab work.  Follow up 4 weeks with Amy Clegg NP-C.  Do the following things EVERYDAY: 1) Weigh yourself in the morning before breakfast. Write it down and keep it in a log. 2) Take your medicines as prescribed 3) Eat low salt foods-Limit salt (sodium) to 2000 mg per day.  4) Stay as active as you can everyday 5) Limit all fluids for the day to less than 2 liters

## 2016-12-06 ENCOUNTER — Telehealth: Payer: Self-pay

## 2016-12-06 NOTE — Telephone Encounter (Signed)
This Case Manager placed call to patient to check status. Patient indicated he was doing well. Patient requesting lab results because he would like to know if he can begin getting testosterone injections. Discussed with Dr. Venetia NightAmao who indicated patient's CBC and PSA were normal so he can pick up testosterone and schedule nurse visit for injection. Patient verbalized understanding and indicated he would pick up testosterone today. Nurse visit scheduled for 12/09/16 at 1000.   Reiterated that patient would benefit from meeting with Financial Counselor to determine if eligible for the [facility]. There are no appointments available at this time, but this Case Manager encouraged patient to call or come to nurse visit early on 12/09/16 (at 0900) to schedule a Financial Counselor appointment. Patient verbalized understanding. Also informed patient that Milderd MeagerOriana, Patient Assistance Coordinator, indicated that medication manufacturers typically want tax statements rather that pay stubs for patient assistance programs. Patient indicated he previously gave Patient Assistance Coordinator a tax statement. He also indicated he went to [facility] yesterday and his Entresto dose was increased to 97/103 twice daily. He indicated he was given samples of medication but will need to meet with Patient Assistance Coordinator once again to complete patient assistance paperwork [facility]. Encouraged patient to follow-up with Patient Assistance Coordinator as soon as possible. Patient verbalized understanding and appreciative of call.

## 2016-12-09 ENCOUNTER — Ambulatory Visit: Payer: Self-pay

## 2016-12-10 ENCOUNTER — Telehealth (HOSPITAL_COMMUNITY): Payer: Self-pay | Admitting: Pharmacist

## 2016-12-10 ENCOUNTER — Other Ambulatory Visit (HOSPITAL_COMMUNITY): Payer: Self-pay

## 2016-12-10 MED ORDER — SACUBITRIL-VALSARTAN 97-103 MG PO TABS: 1.0000 | ORAL_TABLET | Freq: Two times a day (BID) | ORAL | 3 refills | Status: DC

## 2016-12-10 NOTE — Telephone Encounter (Signed)
[employer] patient assistance approved for Entresto 97-103 mg BID (faxing in today) through 12/09/17.   Tyler DeisErika K. Bonnye FavaNicolsen, PharmD, BCPS, CPP Clinical Pharmacist Pager: [phone] Phone: [phone] 12/10/2016 9:49 AM

## 2016-12-11 ENCOUNTER — Telehealth: Payer: Self-pay | Admitting: Internal Medicine

## 2016-12-11 ENCOUNTER — Other Ambulatory Visit: Payer: Self-pay | Admitting: Internal Medicine

## 2016-12-11 MED ORDER — SACUBITRIL-VALSARTAN 97-103 MG PO TABS: 1.0000 | ORAL_TABLET | Freq: Two times a day (BID) | ORAL | 3 refills | Status: DC

## 2016-12-11 NOTE — Telephone Encounter (Signed)
Patient called the office to inform us that he lost the written prescription for testosterone cypionate (DEPO-TESTOSTERONE) 200 MG/ML injection and if [facility] [address].  Thank you.

## 2016-12-12 ENCOUNTER — Other Ambulatory Visit: Payer: Self-pay

## 2016-12-12 NOTE — Telephone Encounter (Signed)
Writer called patient back who is asking for a new testosterone prescription and would [facility].  Writer explained it is a controlled substance and could not be eprescribed.  Writer also explained that Dr. Venetia NightAmao was not in the building this afternoon and he would have to wait until tomorrow.  Patient states that he will go home and look harder for his prescription and will call in the morning.

## 2016-12-13 ENCOUNTER — Ambulatory Visit: Payer: Self-pay

## 2016-12-13 ENCOUNTER — Telehealth: Payer: Self-pay

## 2016-12-13 MED ORDER — TESTOSTERONE CYPIONATE 200 MG/ML IM SOLN: 100.0000 mg | INTRAMUSCULAR | 3 refills | Status: DC

## 2016-12-13 NOTE — Telephone Encounter (Signed)
Done

## 2016-12-13 NOTE — Telephone Encounter (Signed)
Writer called patient this morning who stated that he could not find his testosterone prescription last night.  He is requesting to reschedue his injection and will pick up the new prescription on Monday 12/16/16.  Patient will reschedule with front desk when he picks up his prescription.

## 2016-12-13 NOTE — Addendum Note (Signed)
Addended by: Jaclyn ShaggyAMAO, Dolores Ewing on: 12/13/2016 12:52 PM   Modules accepted: Orders

## 2016-12-16 ENCOUNTER — Other Ambulatory Visit (HOSPITAL_COMMUNITY): Payer: Self-pay | Admitting: *Deleted

## 2016-12-16 ENCOUNTER — Ambulatory Visit (HOSPITAL_COMMUNITY)
Admission: RE | Admit: 2016-12-16 | Discharge: 2016-12-16 | Disposition: A | Discharge status: Home / Self Care | Payer: Self-pay | Source: Ambulatory Visit | Attending: Cardiology | Admitting: Cardiology

## 2016-12-16 DIAGNOSIS — I5022 Chronic systolic (congestive) heart failure: Secondary | ICD-10-CM | POA: Insufficient documentation

## 2016-12-16 LAB — BASIC METABOLIC PANEL
ANION GAP: 8 (ref 5–15)
BUN: 22 mg/dL — ABNORMAL HIGH (ref 6–20)
CALCIUM: 9.4 mg/dL (ref 8.9–10.3)
CHLORIDE: 105 mmol/L (ref 101–111)
CO2: 26 mmol/L (ref 22–32)
Creatinine, Ser: 1.32 mg/dL — ABNORMAL HIGH (ref 0.61–1.24)
GFR calc non Af Amer: >60 mL/min (ref >60)
GLUCOSE: 239 mg/dL — AB (ref 65–99)
Potassium: 4 mmol/L (ref 3.5–5.1)
Sodium: 139 mmol/L (ref 135–145)

## 2016-12-16 MED FILL — $BRILINTA 90 MG TABLET: 90 | 30 days supply | Qty: 60 | Fill #0

## 2016-12-27 ENCOUNTER — Ambulatory Visit: Payer: Self-pay | Attending: Family Medicine | Admitting: *Deleted

## 2016-12-27 DIAGNOSIS — E349 Endocrine disorder, unspecified: Secondary | ICD-10-CM | POA: Insufficient documentation

## 2016-12-27 MED ORDER — TESTOSTERONE CYPIONATE 100 MG/ML IM SOLN
100.0000 mg | INTRAMUSCULAR | Status: DC
  Administered 2016-12-27 – 2017-01-31 (×2): 100 mg via INTRAMUSCULAR

## 2016-12-27 MED FILL — OMEPRAZOLE DR 20 MG CAPSULE: 20 | 30 days supply | Qty: 30 | Fill #1

## 2016-12-27 MED FILL — FUROSEMIDE 20 MG TABLET: 20 | 30 days supply | Qty: 30 | Fill #1

## 2016-12-27 MED FILL — CARVEDILOL 6.25 MG TABLET: 6.25 | 30 days supply | Qty: 60 | Fill #1

## 2016-12-27 MED FILL — SPIRONOLACTONE 50 MG TABLET: 50 | 30 days supply | Qty: 30 | Fill #1

## 2016-12-27 MED FILL — DIGITEK 125 MCG TABLET: 125 | 30 days supply | Qty: 30 | Fill #1

## 2016-12-27 MED FILL — ATORVASTATIN 40 MG TABLET: 40 | 30 days supply | Qty: 30 | Fill #1

## 2016-12-27 NOTE — Progress Notes (Signed)
   Testosterone 100 mg IM given in Right upper outer quadrant. Administered by T.Sharlynn Seckinger,RN. Pt tolerated well.  Next appointment due in 28 days per order. Pt aware.

## 2017-01-02 ENCOUNTER — Ambulatory Visit (HOSPITAL_COMMUNITY)
Admission: RE | Admit: 2017-01-02 | Discharge: 2017-01-02 | Disposition: A | Discharge status: Home / Self Care | Payer: Self-pay | Source: Ambulatory Visit | Attending: Internal Medicine | Admitting: Internal Medicine

## 2017-01-02 ENCOUNTER — Encounter (HOSPITAL_COMMUNITY): Payer: Self-pay

## 2017-01-02 VITALS — BP 122/80 | HR 91 | Ht 78.0 in | Wt 294.4 lb

## 2017-01-02 DIAGNOSIS — I5022 Chronic systolic (congestive) heart failure: Secondary | ICD-10-CM

## 2017-01-02 DIAGNOSIS — I2510 Atherosclerotic heart disease of native coronary artery without angina pectoris: Secondary | ICD-10-CM

## 2017-01-02 DIAGNOSIS — N529 Male erectile dysfunction, unspecified: Secondary | ICD-10-CM | POA: Insufficient documentation

## 2017-01-02 DIAGNOSIS — I255 Ischemic cardiomyopathy: Secondary | ICD-10-CM

## 2017-01-02 DIAGNOSIS — I252 Old myocardial infarction: Secondary | ICD-10-CM | POA: Insufficient documentation

## 2017-01-02 DIAGNOSIS — I110 Hypertensive heart disease with heart failure: Secondary | ICD-10-CM | POA: Insufficient documentation

## 2017-01-02 DIAGNOSIS — E785 Hyperlipidemia, unspecified: Secondary | ICD-10-CM | POA: Insufficient documentation

## 2017-01-02 DIAGNOSIS — E669 Obesity, unspecified: Secondary | ICD-10-CM | POA: Insufficient documentation

## 2017-01-02 DIAGNOSIS — E119 Type 2 diabetes mellitus without complications: Secondary | ICD-10-CM | POA: Insufficient documentation

## 2017-01-02 DIAGNOSIS — Z7982 Long term (current) use of aspirin: Secondary | ICD-10-CM | POA: Insufficient documentation

## 2017-01-02 MED ORDER — FUROSEMIDE 40 MG PO TABS: 40.0000 mg | ORAL_TABLET | Freq: Every day | ORAL | 6 refills | Status: DC

## 2017-01-02 NOTE — Patient Instructions (Signed)
INCREASE Lasix to 40mg , one tab daily  Your physician recommends that you schedule a follow-up appointment in: 2 weeks with Tonye BecketAmy Clegg NP   Do the following things EVERYDAY: 1) Weigh yourself in the morning before breakfast. Write it down and keep it in a log. 2) Take your medicines as prescribed 3) Eat low salt foods-Limit salt (sodium) to 2000 mg per day.  4) Stay as active as you can everyday 5) Limit all fluids for the day to less than 2 liters

## 2017-01-02 NOTE — Progress Notes (Signed)
PCP: Dr. Venetia NightAmao Cardiology: Dr. Antoine PocheHochrein HF Cardiology: Dr. Shirlee LatchMcLean  46 yo with history of CAD and ischemic cardiomyopathy presents for CHF clinic followup.  He initially had NSTEMI in 10/12 with DES to proximal LAD. At that time, EF was noted to be down to 15%.  Over time, EF improved to 50% (3/13 echo).  However, echo in 10/17 showed EF back down to 10% with severe functional MR.  He was taken for cardiac cath.  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  He had DES x 2 to LCx/OM2 and had a staged procedure with DES to distal RCA/ostial PDA.  RHC showed elevated filling pressure and low cardiac output.  While in the hospital, he was on milrinone gtt and was diuresed.  Eventually, we were able to titrate him off milrinone and discharged him home on a po regimen.   Today, he returns for follow up.  Overall feeling good. Started with a cough 1 week ago. Able to walk up steps.  Denies SOB/PND/Orthopnea/CP . Weight at home trending up from 283- to 286 pounds. Taking all medications. He continues to work full time. Trying to get disability.   Labs (11/17): HCT 49.1, K 4.5, creatinine 1.44.  Labs 11/28/2016: K 4.1 Creatinine 1.43   ECG (10/17): NSR, poor RWP, IVCD 114 msec  PMH:  1. CAD: NSTEMI 10/12 with DES to proximal LAD.  - LHC (10/25/16): subtotal occlusion LCx/OM2, 80% dRCA/ostial PCA => DES x 2 to LCx/OM2.  On 10/30, staged procedure with DES to dRCA/ostial PDA.   2. Chronic systolic CHF: Ischemic cardiomyopathy.   - 11/12 echo with EF 15%. - Echo (3/13) with EF 50% - Echo (10/16) with EF 35-40% - Echo (10/17) with EF 10%, severe MR (appears functional).  - RHC (10/17) with mean RA 20, PA 73/36, mean PCWP 28, CI 1.5 Fick/1.74 thermo 3. Type II diabetes 4. HTN 5. Hyperlipidemia 6. Obesity 7. OSA: Suspected.   Social History   Social History  . Marital status: Single    Spouse name: N/A  . Number of children: N/A  . Years of education: N/A    Occupational History  . Not on file.   Social History Main Topics  . Smoking status: Never Smoker  . Smokeless tobacco: Never Used  . Alcohol use 0.6 oz/week    1 Shots of liquor per week     Comment: OCCASIONAL  . Drug use: No  . Sexual activity: Yes   Other Topics Concern  . Not on file   Social History Narrative  . No narrative on file   Family History  Problem Relation Age of Onset  . Heart disease Mother     MOTHER HAD CABG   ROS: All systems reviewed and negative except as per HPI.   Current Outpatient Prescriptions  Medication Sig Dispense Refill  . aspirin 81 MG chewable tablet Chew 1 tablet (81 mg total) by mouth daily. 30 tablet 6  . atorvastatin (LIPITOR) 40 MG tablet Take 1 tablet (40 mg total) by mouth daily at 6 PM. 30 tablet 6  . carvedilol (COREG) 6.25 MG tablet Take 1 tablet (6.25 mg total) by mouth 2 (two) times daily. 60 tablet 6  . digoxin (LANOXIN) 0.125 MG tablet Take 1 tablet (0.125 mg total) by mouth daily. 30 tablet 6  . furosemide (LASIX) 20 MG tablet Take 1 tablet (20 mg total) by mouth daily. 30 tablet 6  . glipiZIDE (GLUCOTROL) 10 MG tablet  Take 1 tablet (10 mg total) by mouth 2 (two) times daily before a meal. 180 tablet 3  . omeprazole (PRILOSEC) 20 MG capsule Take 1 capsule (20 mg total) by mouth daily. 30 capsule 11  . sacubitril-valsartan (ENTRESTO) 97-103 MG Take 1 tablet by mouth 2 (two) times daily. 180 tablet 3  . sildenafil (REVATIO) 20 MG tablet Take 1 tablet (20 mg total) by mouth as needed (for erectile dysfunction). 30 tablet 0  . spironolactone (ALDACTONE) 50 MG tablet Take 1 tablet (50 mg total) by mouth daily. 30 tablet 6  . testosterone cypionate (DEPO-TESTOSTERONE) 200 MG/ML injection Inject 0.5 mLs (100 mg total) into the muscle every 28 (twenty-eight) days. 10 mL 3  . ticagrelor (BRILINTA) 90 MG TABS tablet Take 1 tablet (90 mg total) by mouth 2 (two) times daily. 180 tablet 3   Current Facility-Administered Medications   Medication Dose Route Frequency Provider Last Rate Last Dose  . testosterone cypionate (DEPOTESTOTERONE CYPIONATE) injection 100 mg  100 mg Intramuscular Q28 days Jaclyn Shaggy, MD   100 mg at 12/27/16 1722   BP 122/80 (BP Location: Right Arm, Patient Position: Sitting, Cuff Size: Large)   Pulse 91   Wt 294 lb 6.4 oz (133.5 kg)   SpO2 100%   BMI 34.02 kg/m   Filed Weights   01/02/17 1514  Weight: 294 lb 6.4 oz (133.5 kg)   Filed Weights   01/02/17 1514  Weight: 294 lb 6.4 oz (133.5 kg)   General: NAD. Ambulated in clinic without difficulty Neck: Thick, JVD 9-10, no thyromegaly or thyroid nodule.  Lungs: Clear to auscultation bilaterally with normal respiratory effort. CV: Nondisplaced PMI.  Heart regular S1/S2, no S3/S4, 1/6 HSM apex.  No peripheral edema.  No carotid bruit.  Normal pedal pulses.  Abdomen: Soft, nontender, no hepatosplenomegaly, no distention.  Skin: Intact without lesions or rashes.  Neurologic: Alert and oriented x 3.  Psych: Normal affect. Extremities: No clubbing or cyanosis.  HEENT: Normal.   Assessment/Plan: 1. CAD: Cath in 10/17 after EF found to have significantly dropped.  He had DES x 2 to the LCx system and DES x 1 to RCA.   Continue ASA, Brilinta, statin. No CP.  2. Chronic systolic CHF: EF 96% on 10/17 echo, down from 35-40% in 10/16.  Ischemic cardiomyopathy.  Now s/p PCI as above.  H/O low output HF with volume overload on RHC down in 10/17.  He required milrinone and diuresis while in the hospital.  We were able to titrate off milrinone.    NYHA class II. Volume status elevated. Increase lasix to 40 mg daily.  - Continue Coreg 6.25 mg bid and spironolactone 50 mg daily. Continue dig.  -Continue Entresto to 97/103 twice a day.  -Plan ECHO February.  Not CRT candidate with narrow QRS.   May need advanced therapies if he does not improve.  Reinforced low salt diet and daily weights.  - 3. Suspect OSA: Unable to get sleep study because he doesn't  have insurance.  Will schedule once he gets insurance.  4. Erectile dysfunction:  He understands risk of combination of NTG and Viagra.   Follow up in 2 weeks to assess volume status. Plan for ECHO in 4-6 weeks. Anticipate increasing carvedilol next visit.     Damieon Armendariz NP-C  01/02/2017

## 2017-01-16 ENCOUNTER — Inpatient Hospital Stay (HOSPITAL_COMMUNITY): Admission: RE | Admit: 2017-01-16 | Payer: Self-pay | Source: Ambulatory Visit

## 2017-01-23 ENCOUNTER — Other Ambulatory Visit (HOSPITAL_COMMUNITY): Payer: Self-pay | Admitting: *Deleted

## 2017-01-23 MED ORDER — FUROSEMIDE 40 MG PO TABS: 40.0000 mg | ORAL_TABLET | Freq: Every day | ORAL | 6 refills | Status: DC

## 2017-01-23 MED FILL — SPIRONOLACTONE 50 MG TABLET: 50 | 30 days supply | Qty: 30 | Fill #2

## 2017-01-23 MED FILL — FUROSEMIDE 40 MG TABLET: 40 | 30 days supply | Qty: 30 | Fill #0

## 2017-01-23 MED FILL — OMEPRAZOLE DR 20 MG CAPSULE: 20 | 30 days supply | Qty: 30 | Fill #2

## 2017-01-23 MED FILL — $BRILINTA 90 MG TABLET: 90 | 30 days supply | Qty: 60 | Fill #1

## 2017-01-23 MED FILL — CARVEDILOL 6.25 MG TABLET: 6.25 | 30 days supply | Qty: 60 | Fill #2

## 2017-01-23 MED FILL — ATORVASTATIN 40 MG TABLET: 40 | 30 days supply | Qty: 30 | Fill #2

## 2017-01-23 MED FILL — DIGITEK 125 MCG TABLET: 125 | 30 days supply | Qty: 30 | Fill #2

## 2017-01-29 ENCOUNTER — Telehealth: Payer: Self-pay

## 2017-01-29 NOTE — Telephone Encounter (Signed)
Call to schedule Heart Failure Research Appt. Left message to call Pattricia Bossnnie [phone].

## 2017-01-31 ENCOUNTER — Ambulatory Visit (HOSPITAL_COMMUNITY)
Admission: RE | Admit: 2017-01-31 | Discharge: 2017-01-31 | Disposition: A | Discharge status: Home / Self Care | Payer: Self-pay | Source: Ambulatory Visit | Attending: Cardiology | Admitting: Cardiology

## 2017-01-31 ENCOUNTER — Ambulatory Visit: Payer: Self-pay | Attending: Family Medicine | Admitting: *Deleted

## 2017-01-31 ENCOUNTER — Encounter (HOSPITAL_COMMUNITY): Payer: Self-pay

## 2017-01-31 VITALS — BP 118/74 | HR 80 | Wt 290.2 lb

## 2017-01-31 DIAGNOSIS — E349 Endocrine disorder, unspecified: Secondary | ICD-10-CM | POA: Insufficient documentation

## 2017-01-31 DIAGNOSIS — I110 Hypertensive heart disease with heart failure: Secondary | ICD-10-CM | POA: Insufficient documentation

## 2017-01-31 DIAGNOSIS — I5022 Chronic systolic (congestive) heart failure: Secondary | ICD-10-CM

## 2017-01-31 DIAGNOSIS — Z7982 Long term (current) use of aspirin: Secondary | ICD-10-CM | POA: Insufficient documentation

## 2017-01-31 DIAGNOSIS — E669 Obesity, unspecified: Secondary | ICD-10-CM | POA: Insufficient documentation

## 2017-01-31 DIAGNOSIS — I255 Ischemic cardiomyopathy: Secondary | ICD-10-CM | POA: Insufficient documentation

## 2017-01-31 DIAGNOSIS — Z7984 Long term (current) use of oral hypoglycemic drugs: Secondary | ICD-10-CM | POA: Insufficient documentation

## 2017-01-31 DIAGNOSIS — I2510 Atherosclerotic heart disease of native coronary artery without angina pectoris: Secondary | ICD-10-CM

## 2017-01-31 DIAGNOSIS — E119 Type 2 diabetes mellitus without complications: Secondary | ICD-10-CM | POA: Insufficient documentation

## 2017-01-31 DIAGNOSIS — I252 Old myocardial infarction: Secondary | ICD-10-CM | POA: Insufficient documentation

## 2017-01-31 DIAGNOSIS — E785 Hyperlipidemia, unspecified: Secondary | ICD-10-CM | POA: Insufficient documentation

## 2017-01-31 LAB — BASIC METABOLIC PANEL
Anion gap: 13 (ref 5–15)
BUN: 33 mg/dL — ABNORMAL HIGH (ref 6–20)
CALCIUM: 9.4 mg/dL (ref 8.9–10.3)
CO2: 25 mmol/L (ref 22–32)
CREATININE: 1.43 mg/dL — AB (ref 0.61–1.24)
Chloride: 95 mmol/L — ABNORMAL LOW (ref 101–111)
GFR calc non Af Amer: 59 mL/min — ABNORMAL LOW (ref >60)
Glucose, Bld: 336 mg/dL — ABNORMAL HIGH (ref 65–99)
Potassium: 4.2 mmol/L (ref 3.5–5.1)
SODIUM: 133 mmol/L — AB (ref 135–145)

## 2017-01-31 LAB — DIGOXIN LEVEL: Digoxin Level: 0.2 ng/mL — ABNORMAL LOW (ref 0.8–2.0)

## 2017-01-31 MED ORDER — TESTOSTERONE CYPIONATE 100 MG/ML IM SOLN: 100.0000 mg | INTRAMUSCULAR | Status: DC

## 2017-01-31 MED ORDER — CARVEDILOL 12.5 MG PO TABS: 12.5000 mg | ORAL_TABLET | Freq: Two times a day (BID) | ORAL | 3 refills | Status: DC

## 2017-01-31 NOTE — Patient Instructions (Signed)
Increase Carvedilol to 9.375 mg (1 & 1/2 tabs) Twice daily FOR 3-4 DAYS, THEN Increase to 12.5 mg Twice daily   Labs today  Your physician recommends that you schedule a follow-up appointment in: 2 weeks with echocardiogram

## 2017-01-31 NOTE — Progress Notes (Signed)
Pt arrived for testosterone injection

## 2017-01-31 NOTE — Progress Notes (Unsigned)
Met with patient and discussed [facility] Heart Failure Study, copy of consent and printout regarding details of the study given to the patient. We will speak on Monday. Dr. Aundra Dubin aware and feels patient would be good for the study.

## 2017-02-02 NOTE — Progress Notes (Signed)
PCP: Dr. Venetia Night Cardiology: Dr. Antoine Poche HF Cardiology: Dr. Shirlee Latch  46 yo with history of CAD and ischemic cardiomyopathy presents for CHF clinic followup.  He initially had NSTEMI in 10/12 with DES to proximal LAD. At that time, EF was noted to be down to 15%.  Over time, EF improved to 50% (3/13 echo).  However, echo in 10/17 showed EF back down to 10% with severe functional MR.  He was taken for cardiac cath.  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  He had DES x 2 to LCx/OM2 and had a staged procedure with DES to distal RCA/ostial PDA.  RHC showed elevated filling pressure and low cardiac output.  While in the hospital, he was on milrinone gtt and was diuresed.  Eventually, we were able to titrate him off milrinone and discharged him home on a po regimen.   Today, he returns for followup.  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  He is trying to walk for exercise. No exertional dyspnea or chest pain.  He is able to lie flat without problems.  No PND, no lightheadedness or palpitations.  Weight is down 4 lbs.    Labs (11/17): HCT 49.1, K 4.5, creatinine 1.44.  Labs (12/17): K 4, creatinine 1.32, HCT 43.6  ECG (10/17): NSR, poor RWP, IVCD 114 msec  PMH:  1. CAD: NSTEMI 10/12 with DES to proximal LAD.  - LHC (10/25/16): subtotal occlusion LCx/OM2, 80% dRCA/ostial PCA => DES x 2 to LCx/OM2.  On 10/30, staged procedure with DES to dRCA/ostial PDA.   2. Chronic systolic CHF: Ischemic cardiomyopathy.   - 11/12 echo with EF 15%. - Echo (3/13) with EF 50% - Echo (10/16) with EF 35-40% - Echo (10/17) with EF 10%, severe MR (appears functional).  - RHC (10/17) with mean RA 20, PA 73/36, mean PCWP 28, CI 1.5 Fick/1.74 thermo 3. Type II diabetes 4. HTN 5. Hyperlipidemia 6. Obesity 7. OSA: Suspected.   Social History   Social History  . Marital status: Single    Spouse name: N/A  . Number of children: N/A  . Years of education: N/A   Occupational  History  . Not on file.   Social History Main Topics  . Smoking status: Never Smoker  . Smokeless tobacco: Never Used  . Alcohol use 0.6 oz/week    1 Shots of liquor per week     Comment: OCCASIONAL  . Drug use: No  . Sexual activity: Yes   Other Topics Concern  . Not on file   Social History Narrative  . No narrative on file   Family History  Problem Relation Age of Onset  . Heart disease Mother     MOTHER HAD CABG   ROS: All systems reviewed and negative except as per HPI.   Current Outpatient Prescriptions  Medication Sig Dispense Refill  . aspirin 81 MG chewable tablet Chew 1 tablet (81 mg total) by mouth daily. 30 tablet 6  . atorvastatin (LIPITOR) 40 MG tablet Take 1 tablet (40 mg total) by mouth daily at 6 PM. 30 tablet 6  . carvedilol (COREG) 12.5 MG tablet Take 1 tablet (12.5 mg total) by mouth 2 (two) times daily. 60 tablet 3  . digoxin (LANOXIN) 0.125 MG tablet Take 1 tablet (0.125 mg total) by mouth daily. 30 tablet 6  . furosemide (LASIX) 40 MG tablet Take 1 tablet (40 mg total) by mouth daily. 30 tablet 6  . glipiZIDE (  GLUCOTROL) 10 MG tablet Take 1 tablet (10 mg total) by mouth 2 (two) times daily before a meal. 180 tablet 3  . omeprazole (PRILOSEC) 20 MG capsule Take 1 capsule (20 mg total) by mouth daily. 30 capsule 11  . sacubitril-valsartan (ENTRESTO) 97-103 MG Take 1 tablet by mouth 2 (two) times daily. 180 tablet 3  . sildenafil (REVATIO) 20 MG tablet Take 1 tablet (20 mg total) by mouth as needed (for erectile dysfunction). 30 tablet 0  . spironolactone (ALDACTONE) 50 MG tablet Take 1 tablet (50 mg total) by mouth daily. 30 tablet 6  . testosterone cypionate (DEPO-TESTOSTERONE) 200 MG/ML injection Inject 0.5 mLs (100 mg total) into the muscle every 28 (twenty-eight) days. 10 mL 3  . ticagrelor (BRILINTA) 90 MG TABS tablet Take 1 tablet (90 mg total) by mouth 2 (two) times daily. 180 tablet 3   Current Facility-Administered Medications  Medication Dose  Route Frequency Provider Last Rate Last Dose  . testosterone cypionate (DEPOTESTOTERONE CYPIONATE) injection 100 mg  100 mg Intramuscular Q28 days Jaclyn ShaggyEnobong Amao, MD   100 mg at 01/31/17 1630  . testosterone cypionate (DEPOTESTOTERONE CYPIONATE) injection 100 mg  100 mg Intramuscular Q28 days Enobong Amao, MD       BP 118/74   Pulse 80   Wt 290 lb 4 oz (131.7 kg)   SpO2 99%   BMI 33.54 kg/m  General: NAD Neck: Thick, no JVD, no thyromegaly or thyroid nodule.  Lungs: Clear to auscultation bilaterally with normal respiratory effort. CV: Nondisplaced PMI.  Heart regular S1/S2, no S3/S4, 1/6 HSM apex.  No peripheral edema.  No carotid bruit.  Normal pedal pulses.  Abdomen: Soft, nontender, no hepatosplenomegaly, no distention.  Skin: Intact without lesions or rashes.  Neurologic: Alert and oriented x 3.  Psych: Normal affect. Extremities: No clubbing or cyanosis.  HEENT: Normal.   Assessment/Plan: 1. CAD: Cath in 10/17 after EF found to have significantly dropped.  He had DES x 2 to the LCx system and DES x 1 to RCA.  Hopefully this will allow some improvement in his LV systolic function, but doubt that this will normalize it.   - Continue ASA, Brilinta.  - Continue statin.  2. Chronic systolic CHF: EF 16%10% on 10/17 echo, down from 35-40% in 10/16.  Ischemic cardiomyopathy.  Now s/p PCI as above.  He had low output HF with volume overload on RHC down in 10/17.  He required milrinone and diuresis while in the hospital.  We were able to titrate off milrinone.  Currently much improved, NYHA class II symptoms with no significant volume overload.  - Continue Lasix 20 mg daily. BMET today.  - Continue spironolactone 50 mg daily.  - Increase Coreg to 9.375 mg bid x 4 days then to 12.5 mg bid.  - Continue Entresto 49/51 bid.  - He will need an echo later this month for ICD evaluation.  He will not be a CRT candidate with narrow QRS.   - Continue digoxin, check level today.  - If he does not have  significant improvement in function from recent PCI and medication adjustment, he may be a candidate for advanced therapies in the future, especially given low output noted during last admission. He needs to get health insurance, will ask our social worker to help him with this.  3. Suspect OSA: When he gets insurance, will need sleep study.    Followup in 1 month.  Marca AnconaDalton Constance Whittle 02/02/2017

## 2017-02-26 ENCOUNTER — Telehealth: Payer: Self-pay | Admitting: Family Medicine

## 2017-02-26 MED ORDER — TESTOSTERONE CYPIONATE 200 MG/ML IM SOLN: 100.0000 mg | INTRAMUSCULAR | 3 refills | Status: DC

## 2017-02-26 MED FILL — OMEPRAZOLE DR 20 MG CAPSULE: 20 | 30 days supply | Qty: 30 | Fill #3

## 2017-02-26 MED FILL — ATORVASTATIN 40 MG TABLET: 40 | 30 days supply | Qty: 30 | Fill #3

## 2017-02-26 MED FILL — CARVEDILOL 6.25 MG TABLET: 6.25 | 30 days supply | Qty: 60 | Fill #3

## 2017-02-26 MED FILL — FUROSEMIDE 40 MG TABLET: 40 | 30 days supply | Qty: 30 | Fill #1

## 2017-02-26 MED FILL — SPIRONOLACTONE 50 MG TABLET: 50 | 30 days supply | Qty: 30 | Fill #3

## 2017-02-26 MED FILL — $BRILINTA 90 MG TABLET: 90 | 30 days supply | Qty: 60 | Fill #2

## 2017-02-26 MED FILL — DIGITEK 125 MCG TABLET: 125 | 30 days supply | Qty: 30 | Fill #3

## 2017-02-26 NOTE — Telephone Encounter (Signed)
Pt. Called requesting a refill on his testosterone shot. Pt. [facility] Please f/u

## 2017-02-26 NOTE — Telephone Encounter (Signed)
Refilled. It is a controlled prescription and has to be picked up

## 2017-03-07 ENCOUNTER — Ambulatory Visit (HOSPITAL_COMMUNITY): Payer: Self-pay

## 2017-03-12 ENCOUNTER — Other Ambulatory Visit: Payer: Self-pay | Admitting: Cardiology

## 2017-03-12 DIAGNOSIS — I2510 Atherosclerotic heart disease of native coronary artery without angina pectoris: Secondary | ICD-10-CM

## 2017-03-31 ENCOUNTER — Telehealth (HOSPITAL_COMMUNITY): Payer: Self-pay | Admitting: *Deleted

## 2017-03-31 DIAGNOSIS — I2510 Atherosclerotic heart disease of native coronary artery without angina pectoris: Secondary | ICD-10-CM

## 2017-03-31 MED ORDER — GLIPIZIDE 10 MG PO TABS: 10.0000 mg | ORAL_TABLET | Freq: Two times a day (BID) | ORAL | 0 refills | Status: DC

## 2017-03-31 MED FILL — OMEPRAZOLE DR 20 MG CAPSULE: 20 | 30 days supply | Qty: 30 | Fill #4

## 2017-03-31 MED FILL — DIGITEK 125 MCG TABLET: 125 | 30 days supply | Qty: 30 | Fill #4

## 2017-03-31 MED FILL — FUROSEMIDE 40 MG TABLET: 40 | 30 days supply | Qty: 30 | Fill #2

## 2017-03-31 MED FILL — CARVEDILOL 6.25 MG TABLET: 6.25 | 30 days supply | Qty: 60 | Fill #4

## 2017-03-31 MED FILL — ATORVASTATIN 40 MG TABLET: 40 | 30 days supply | Qty: 30 | Fill #4

## 2017-03-31 MED FILL — SPIRONOLACTONE 50 MG TABLET: 50 | 30 days supply | Qty: 30 | Fill #4

## 2017-03-31 NOTE — Telephone Encounter (Signed)
Pt called to ask if we could possible prescribe his Glipizide, he states he has been out for a week now and can not get anyone on the phone at [facility] to get him a prescription and with his working hours he can not get by there during the day.  Advised we do not normally prescribe this med but since he is having trouble will send him in 1 month supply to [facility], he is thankful and says he will get by community health this week

## 2017-04-01 ENCOUNTER — Telehealth: Payer: Self-pay | Admitting: Family Medicine

## 2017-04-01 DIAGNOSIS — I2510 Atherosclerotic heart disease of native coronary artery without angina pectoris: Secondary | ICD-10-CM

## 2017-04-01 MED ORDER — GLIPIZIDE 10 MG PO TABS: 10.0000 mg | ORAL_TABLET | Freq: Two times a day (BID) | ORAL | 0 refills | Status: DC

## 2017-04-01 MED FILL — $BRILINTA 90 MG TABLET: 90 | 30 days supply | Qty: 60 | Fill #3

## 2017-04-01 NOTE — Telephone Encounter (Signed)
Pt requesting refill of Glipizide[facility] Please f/u.

## 2017-04-01 NOTE — Telephone Encounter (Signed)
Glipizide refilled

## 2017-04-04 ENCOUNTER — Ambulatory Visit (HOSPITAL_COMMUNITY)
Admission: RE | Admit: 2017-04-04 | Discharge: 2017-04-04 | Disposition: A | Discharge status: Home / Self Care | Payer: Self-pay | Source: Ambulatory Visit | Attending: Family Medicine | Admitting: Family Medicine

## 2017-04-04 DIAGNOSIS — I5022 Chronic systolic (congestive) heart failure: Secondary | ICD-10-CM | POA: Insufficient documentation

## 2017-04-04 LAB — ECHOCARDIOGRAM COMPLETE
E/e' ratio: 19.05
EWDT: 215 ms
FS: 21 % — AB (ref 28–44)
IV/PV OW: 0.86
LA vol A4C: 49.6 ml
LA vol index: 17.1 mL/m2
LA vol: 46.6 mL
LADIAMINDEX: 1.61 cm/m2
LASIZE: 44 mm
LDCA: 6.16 cm2
LEFT ATRIUM END SYS DIAM: 44 mm
LV E/e' medial: 19.05
LV E/e'average: 19.05
LV PW d: 11.6 mm — AB (ref 0.6–1.1)
LV TDI E'LATERAL: 3.15
LV sys vol index: 33 mL/m2
LV sys vol: 90 mL — AB (ref 21–61)
LVDIAVOL: 142 mL (ref 62–150)
LVDIAVOLIN: 52 mL/m2
LVELAT: 3.15 cm/s
LVOT VTI: 16.1 cm
LVOT peak grad rest: 3 mmHg
LVOT peak vel: 91.1 cm/s
LVOTD: 28 mm
LVOTSV: 99 mL
MV Dec: 215
MV pk E vel: 60 m/s
MVPKAVEL: 89.8 m/s
RV LATERAL S' VELOCITY: 9.68 cm/s
Simpson's disk: 37
Stroke v: 52 ml
TAPSE: 19.3 mm
TDI e' medial: 3.92

## 2017-04-04 NOTE — Progress Notes (Signed)
  Echocardiogram 2D Echocardiogram has been performed.  Delcie Roch 04/04/2017, 4:09 PM

## 2017-04-08 ENCOUNTER — Telehealth (HOSPITAL_COMMUNITY): Payer: Self-pay | Admitting: *Deleted

## 2017-04-08 NOTE — Telephone Encounter (Signed)
ECHOCARDIOGRAM COMPLETE  Order: [id number]  Status:  Final result Visible to patient:  Yes (MyChart) Dx:  Chronic systolic heart failure (HCC)  Notes recorded by Suezanne Cheshire, RN on 04/08/2017 at 3:07 PM EDT Patient is aware and no further questions at this time. ------  Notes recorded by Laurey Morale, MD on 04/05/2017 at 11:00 AM EDT EF improved to 35-40%, out of ICD range.

## 2017-04-11 ENCOUNTER — Ambulatory Visit: Payer: Self-pay

## 2017-04-18 ENCOUNTER — Inpatient Hospital Stay (HOSPITAL_COMMUNITY): Admission: RE | Admit: 2017-04-18 | Payer: Self-pay | Source: Ambulatory Visit

## 2017-04-28 ENCOUNTER — Telehealth: Payer: Self-pay | Admitting: Family Medicine

## 2017-04-28 DIAGNOSIS — I2510 Atherosclerotic heart disease of native coronary artery without angina pectoris: Secondary | ICD-10-CM

## 2017-04-28 MED ORDER — GLIPIZIDE 10 MG PO TABS: 10.0000 mg | ORAL_TABLET | Freq: Two times a day (BID) | ORAL | 0 refills | Status: DC

## 2017-04-28 MED FILL — CARVEDILOL 6.25 MG TABLET: 6.25 | 30 days supply | Qty: 60 | Fill #5

## 2017-04-28 MED FILL — DIGOXIN 0.125 MG TABLET: 125 | 30 days supply | Qty: 30 | Fill #5

## 2017-04-28 MED FILL — FUROSEMIDE 40 MG TABLET: 40 | 30 days supply | Qty: 30 | Fill #3

## 2017-04-28 MED FILL — OMEPRAZOLE DR 20 MG CAPSULE: 20 | 30 days supply | Qty: 30 | Fill #5

## 2017-04-28 MED FILL — SPIRONOLACTONE 50 MG TABLET: 50 | 30 days supply | Qty: 30 | Fill #5

## 2017-04-28 MED FILL — ATORVASTATIN 40 MG TABLET: 40 | 30 days supply | Qty: 30 | Fill #5

## 2017-04-28 NOTE — Telephone Encounter (Signed)
PT came to the office requesting a refill for glipiZIDE (GLUCOTROL) 10 MG tablet  He is going to be traveling tomorrow if you can sent the refill today for him to get it at [facility] please follow up with PT

## 2017-04-28 NOTE — Telephone Encounter (Signed)
Done

## 2017-04-28 NOTE — Telephone Encounter (Signed)
Writer LVM letting patient know that the requested medication was sent to his [facility].

## 2017-05-05 MED FILL — $BRILINTA 90 MG TABLET: 90 | 30 days supply | Qty: 60 | Fill #4

## 2017-06-02 MED FILL — FUROSEMIDE 40 MG TABLET: 40 | 30 days supply | Qty: 30 | Fill #4

## 2017-06-02 MED FILL — OMEPRAZOLE DR 20 MG CAPSULE: 20 | 30 days supply | Qty: 30 | Fill #6

## 2017-06-02 MED FILL — DIGOXIN 0.125 MG TABLET: 125 | 30 days supply | Qty: 30 | Fill #6

## 2017-06-02 MED FILL — CARVEDILOL 6.25 MG TABLET: 6.25 | 30 days supply | Qty: 60 | Fill #6

## 2017-06-02 MED FILL — ATORVASTATIN 40 MG TABLET: 40 | 30 days supply | Qty: 30 | Fill #6

## 2017-06-02 MED FILL — SPIRONOLACTONE 50 MG TAB: 50 | 30 days supply | Qty: 30 | Fill #6

## 2017-06-09 MED FILL — $BRILINTA 90 MG TABLET: 90 | 30 days supply | Qty: 60 | Fill #5

## 2017-07-10 ENCOUNTER — Other Ambulatory Visit (HOSPITAL_COMMUNITY): Payer: Self-pay | Admitting: Cardiology

## 2017-07-10 MED FILL — DIGOXIN 0.125 MG TABLET: 125 | 30 days supply | Qty: 30 | Fill #0

## 2017-07-10 MED FILL — FUROSEMIDE 40 MG TABLET: 40 | 30 days supply | Qty: 30 | Fill #5

## 2017-07-10 MED FILL — SPIRONOLACTONE 50 MG TAB: 50 | 30 days supply | Qty: 30 | Fill #0

## 2017-07-10 MED FILL — ATORVASTATIN 40 MG TABLET: 40 | 30 days supply | Qty: 30 | Fill #0

## 2017-07-10 MED FILL — OMEPRAZOLE DR 20 MG CAPSULE: 20 | 30 days supply | Qty: 30 | Fill #7

## 2017-07-10 MED FILL — CARVEDILOL 6.25 MG TABLET: 6.25 | 30 days supply | Qty: 60 | Fill #0

## 2017-08-06 MED FILL — OMEPRAZOLE DR 20 MG CAPSULE: 20 | 30 days supply | Qty: 30 | Fill #8

## 2017-08-06 MED FILL — ATORVASTATIN 40 MG TABLET: 40 | 30 days supply | Qty: 30 | Fill #1

## 2017-08-06 MED FILL — DIGOXIN 0.125 MG TABLET: 125 | 30 days supply | Qty: 30 | Fill #1

## 2017-08-06 MED FILL — FUROSEMIDE 40 MG TABLET: 40 | 30 days supply | Qty: 30 | Fill #6

## 2017-08-06 MED FILL — $BRILINTA 90 MG TABLET: 90 | 30 days supply | Qty: 60 | Fill #6

## 2017-08-06 MED FILL — SPIRONOLACTONE 50 MG TAB: 50 | 30 days supply | Qty: 30 | Fill #1

## 2017-08-19 ENCOUNTER — Other Ambulatory Visit: Payer: Self-pay | Admitting: Family Medicine

## 2017-08-19 ENCOUNTER — Telehealth: Payer: Self-pay | Admitting: Family Medicine

## 2017-08-19 DIAGNOSIS — I2510 Atherosclerotic heart disease of native coronary artery without angina pectoris: Secondary | ICD-10-CM

## 2017-08-19 NOTE — Telephone Encounter (Signed)
Patient last seen December 2017, will forward to PCP

## 2017-08-19 NOTE — Telephone Encounter (Signed)
Pt called to request a refill for  glipiZIDE (GLUCOTROL) 10 MG tablet   Please follow up

## 2017-08-20 ENCOUNTER — Other Ambulatory Visit: Payer: Self-pay | Admitting: Family Medicine

## 2017-08-20 DIAGNOSIS — I2510 Atherosclerotic heart disease of native coronary artery without angina pectoris: Secondary | ICD-10-CM

## 2017-08-20 NOTE — Telephone Encounter (Signed)
Patient called requesting status of medication refill on glipiZIDE (GLUCOTROL) 10 MG tablet

## 2017-08-21 MED ORDER — GLIPIZIDE 10 MG PO TABS: 10.0000 mg | ORAL_TABLET | Freq: Two times a day (BID) | ORAL | 0 refills | Status: DC

## 2017-08-21 NOTE — Telephone Encounter (Signed)
Refilled

## 2017-09-12 ENCOUNTER — Other Ambulatory Visit (HOSPITAL_COMMUNITY): Payer: Self-pay | Admitting: Internal Medicine

## 2017-09-12 ENCOUNTER — Other Ambulatory Visit (HOSPITAL_COMMUNITY): Payer: Self-pay | Admitting: Adult Health

## 2017-09-12 MED ORDER — ATORVASTATIN CALCIUM 40 MG PO TABS: 40.0000 mg | ORAL_TABLET | Freq: Every day | ORAL | 2 refills | Status: DC

## 2017-09-12 MED ORDER — CARVEDILOL 12.5 MG PO TABS: 12.5000 mg | ORAL_TABLET | Freq: Two times a day (BID) | ORAL | 2 refills | Status: DC

## 2017-09-12 MED ORDER — DIGOXIN 125 MCG PO TABS: 0.1250 mg | ORAL_TABLET | Freq: Every day | ORAL | 2 refills | Status: DC

## 2017-09-12 MED ORDER — SPIRONOLACTONE 50 MG PO TABS: 50.0000 mg | ORAL_TABLET | Freq: Every day | ORAL | 2 refills | Status: DC

## 2017-09-12 MED FILL — ATORVASTATIN 40 MG TABLET: 40 | 34 days supply | Qty: 34 | Fill #0

## 2017-09-12 MED FILL — FUROSEMIDE 40 MG TABLET: 40 | 100 days supply | Qty: 100 | Fill #0

## 2017-09-12 MED FILL — SPIRONOLACTONE 50 MG TAB: 50 | 34 days supply | Qty: 34 | Fill #0

## 2017-09-12 MED FILL — DIGOXIN 0.125 MG TABLET: 125 | 34 days supply | Qty: 34 | Fill #0

## 2017-09-12 MED FILL — OMEPRAZOLE 20 MG CAP: 20 | 30 days supply | Qty: 30 | Fill #9

## 2017-09-12 MED FILL — CARVEDILOL 12.5 MG TABLET: 12.5 | 34 days supply | Qty: 68 | Fill #0

## 2017-09-16 MED FILL — $BRILINTA 90 MG TABLET: 90 | 30 days supply | Qty: 60 | Fill #7

## 2017-10-14 ENCOUNTER — Telehealth (HOSPITAL_COMMUNITY): Payer: Self-pay | Admitting: *Deleted

## 2017-10-14 NOTE — Telephone Encounter (Signed)
For what I can tell his Coreg was increased in February. Also, I do not think that Coreg would cause right hand tingling. More likely nerve related. Is he dizzy when changing positions? I think he should stay on his current dose of Coreg.   Little Ishikawa 12:02 PM

## 2017-10-14 NOTE — Telephone Encounter (Signed)
I called patient back and he only had one episode of dizziness last week but he said he stood up too fast.  I told him to continue taking the 12.5 mg dosage of carvedilol and to call us back if he starts to have dizziness.  We will follow up with him at his appointment on Monday.

## 2017-10-14 NOTE — Telephone Encounter (Signed)
Advanced Heart Failure Triage Encounter  Patient Name: [name]  Date of Call: 10/14/17  Problem: Right hand tingling  Patient called complaining of right hand tingling that started this past Saturday. He is relating it to his recent increased dose of carvedilol and is asking if he should decrease it back to the 6.25 mg BID dose. He does have an appt on 10/22.   Plan:  Will send to Suzzette Righter, NP to see if he needs to decrease carvedilol back to 6.25 mg BID.   Georgina Peer, RN

## 2017-10-20 ENCOUNTER — Encounter (HOSPITAL_COMMUNITY): Payer: Self-pay

## 2017-10-20 ENCOUNTER — Ambulatory Visit (HOSPITAL_COMMUNITY)
Admission: RE | Admit: 2017-10-20 | Discharge: 2017-10-20 | Disposition: A | Discharge status: Home / Self Care | Payer: Self-pay | Source: Ambulatory Visit | Attending: Internal Medicine | Admitting: Internal Medicine

## 2017-10-20 VITALS — BP 122/88 | HR 86 | Wt 288.6 lb

## 2017-10-20 DIAGNOSIS — I110 Hypertensive heart disease with heart failure: Secondary | ICD-10-CM | POA: Insufficient documentation

## 2017-10-20 DIAGNOSIS — Z79899 Other long term (current) drug therapy: Secondary | ICD-10-CM | POA: Insufficient documentation

## 2017-10-20 DIAGNOSIS — E119 Type 2 diabetes mellitus without complications: Secondary | ICD-10-CM | POA: Insufficient documentation

## 2017-10-20 DIAGNOSIS — I10 Essential (primary) hypertension: Secondary | ICD-10-CM

## 2017-10-20 DIAGNOSIS — I420 Dilated cardiomyopathy: Secondary | ICD-10-CM

## 2017-10-20 DIAGNOSIS — E669 Obesity, unspecified: Secondary | ICD-10-CM | POA: Insufficient documentation

## 2017-10-20 DIAGNOSIS — I2510 Atherosclerotic heart disease of native coronary artery without angina pectoris: Secondary | ICD-10-CM | POA: Insufficient documentation

## 2017-10-20 DIAGNOSIS — I252 Old myocardial infarction: Secondary | ICD-10-CM | POA: Insufficient documentation

## 2017-10-20 DIAGNOSIS — I255 Ischemic cardiomyopathy: Secondary | ICD-10-CM | POA: Insufficient documentation

## 2017-10-20 DIAGNOSIS — E785 Hyperlipidemia, unspecified: Secondary | ICD-10-CM | POA: Insufficient documentation

## 2017-10-20 DIAGNOSIS — Z7982 Long term (current) use of aspirin: Secondary | ICD-10-CM | POA: Insufficient documentation

## 2017-10-20 DIAGNOSIS — I5022 Chronic systolic (congestive) heart failure: Secondary | ICD-10-CM | POA: Insufficient documentation

## 2017-10-20 DIAGNOSIS — Z8249 Family history of ischemic heart disease and other diseases of the circulatory system: Secondary | ICD-10-CM | POA: Insufficient documentation

## 2017-10-20 LAB — BASIC METABOLIC PANEL
Anion gap: 9 (ref 5–15)
BUN: 27 mg/dL — AB (ref 6–20)
CO2: 27 mmol/L (ref 22–32)
CREATININE: 1.29 mg/dL — AB (ref 0.61–1.24)
Calcium: 9.6 mg/dL (ref 8.9–10.3)
Chloride: 98 mmol/L — ABNORMAL LOW (ref 101–111)
GFR calc Af Amer: >60 mL/min (ref >60)
Glucose, Bld: 303 mg/dL — ABNORMAL HIGH (ref 65–99)
Potassium: 4 mmol/L (ref 3.5–5.1)
SODIUM: 134 mmol/L — AB (ref 135–145)

## 2017-10-20 MED FILL — CARVEDILOL 12.5 MG TABLET: 12.5 | 34 days supply | Qty: 68 | Fill #1

## 2017-10-20 MED FILL — SPIRONOLACTONE 50 MG TAB: 50 | 34 days supply | Qty: 34 | Fill #1

## 2017-10-20 MED FILL — DIGOXIN 0.125 MG TABLET: 125 | 34 days supply | Qty: 34 | Fill #1

## 2017-10-20 MED FILL — OMEPRAZOLE 20 MG CAP: 20 | 30 days supply | Qty: 30 | Fill #10

## 2017-10-20 NOTE — Progress Notes (Signed)
PCP: Dr. Venetia Night Cardiology: Dr. Antoine Poche HF Cardiology: Dr. Shirlee Latch  46 yo with history of CAD and ischemic cardiomyopathy presents for CHF clinic followup.  He initially had NSTEMI in 10/12 with DES to proximal LAD. At that time, EF was noted to be down to 15%.  Over time, EF improved to 50% (3/13 echo).  However, echo in 10/17 showed EF back down to 10% with severe functional MR.  He was taken for cardiac cath.  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  He had DES x 2 to LCx/OM2 and had a staged procedure with DES to distal RCA/ostial PDA.  RHC showed elevated filling pressure and low cardiac output.  While in the hospital, he was on milrinone gtt and was diuresed.  Eventually, we were able to titrate him off milrinone and discharged him home on a po regimen.   Returns today for HF follow up. Feeling good, he parked further away from the clinic today so that he could get some exercise in. Denies SOB with walking and SOB with stairs. Denies orthopnea, PND and chest pain. His main compliant today is right wrist and pinky finger numbness.  He tried a higher dose of Coreg at 9.375 mg BID and developed diarrhea. Then, went back to taking 6.25 mg and is tolerating well.    Labs (11/17): HCT 49.1, K 4.5, creatinine 1.44.  Labs (12/17): K 4, creatinine 1.32, HCT 43.6  ECG (10/17): NSR, poor RWP, IVCD 114 msec  PMH:  1. CAD: NSTEMI 10/12 with DES to proximal LAD.  - LHC (10/25/16): subtotal occlusion LCx/OM2, 80% dRCA/ostial PCA => DES x 2 to LCx/OM2.  On 10/30, staged procedure with DES to dRCA/ostial PDA.   2. Chronic systolic CHF: Ischemic cardiomyopathy.   - 11/12 echo with EF 15%. - Echo (3/13) with EF 50% - Echo (10/16) with EF 35-40% - Echo (10/17) with EF 10%, severe MR (appears functional).  - RHC (10/17) with mean RA 20, PA 73/36, mean PCWP 28, CI 1.5 Fick/1.74 thermo 3. Type II diabetes 4. HTN 5. Hyperlipidemia 6. Obesity 7. OSA: Suspected.    Social History   Social History  . Marital status: Single    Spouse name: N/A  . Number of children: N/A  . Years of education: N/A   Occupational History  . Not on file.   Social History Main Topics  . Smoking status: Never Smoker  . Smokeless tobacco: Never Used  . Alcohol use 0.6 oz/week    1 Shots of liquor per week     Comment: OCCASIONAL  . Drug use: No  . Sexual activity: Yes   Other Topics Concern  . Not on file   Social History Narrative  . No narrative on file   Family History  Problem Relation Age of Onset  . Heart disease Mother        MOTHER HAD CABG   ROS: All systems reviewed and negative except as per HPI.   Current Outpatient Prescriptions  Medication Sig Dispense Refill  . aspirin 81 MG chewable tablet Chew 1 tablet (81 mg total) by mouth daily. 30 tablet 6  . atorvastatin (LIPITOR) 40 MG tablet Take 1 tablet (40 mg total) by mouth daily at 6 PM. Needs office visit 30 tablet 2  . carvedilol (COREG) 6.25 MG tablet Take 6.25 mg by mouth 2 (two) times daily with a meal.    . digoxin (LANOXIN) 0.125 MG tablet Take 1 tablet (0.125 mg  total) by mouth daily. Needs office visit 30 tablet 2  . furosemide (LASIX) 40 MG tablet Take 1 tablet (40 mg total) by mouth daily. 30 tablet 2  . glipiZIDE (GLUCOTROL) 10 MG tablet Take 1 tablet (10 mg total) by mouth 2 (two) times daily before a meal. 60 tablet 0  . omeprazole (PRILOSEC) 20 MG capsule Take 1 capsule (20 mg total) by mouth daily. 30 capsule 11  . sacubitril-valsartan (ENTRESTO) 97-103 MG Take 1 tablet by mouth 2 (two) times daily. 180 tablet 3  . sildenafil (REVATIO) 20 MG tablet Take 1 tablet (20 mg total) by mouth as needed (for erectile dysfunction). 30 tablet 0  . spironolactone (ALDACTONE) 50 MG tablet Take 1 tablet (50 mg total) by mouth daily. Needs office visit 30 tablet 2  . testosterone cypionate (DEPO-TESTOSTERONE) 200 MG/ML injection Inject 0.5 mLs (100 mg total) into the muscle every 28  (twenty-eight) days. 10 mL 3  . ticagrelor (BRILINTA) 90 MG TABS tablet Take 1 tablet (90 mg total) by mouth 2 (two) times daily. 180 tablet 3   No current facility-administered medications for this encounter.    BP 122/88 (BP Location: Left Arm, Patient Position: Sitting, Cuff Size: Normal)   Pulse 86   Wt 288 lb 9.6 oz (130.9 kg)   SpO2 99%   BMI 33.35 kg/m   General: Well appearing, obese male. No resp difficulty. HEENT: Normal Neck: Supple. JVP 5-6. Carotids 2+ bilat; no bruits. No thyromegaly or nodule noted. Cor: PMI nondisplaced. RRR, No M/G/R noted Lungs: CTAB, normal effort. Abdomen: Soft, non-tender, non-distended, no HSM. No bruits or masses. +BS  Extremities: No cyanosis, clubbing, or rash. R and LLE no edema.  Neuro: Alert & orientedx3, cranial nerves grossly intact. moves all 4 extremities w/o difficulty. Affect pleasant    Assessment/Plan: 1. CAD: Cath in 10/17 after EF found to have significantly dropped.  He had DES x 2 to the LCx system and DES x 1 to RCA.  Hopefully this will allow some improvement in his LV systolic function, but doubt that this will normalize it.   - Continue ASA.  - He will be one year post PCI on 10/28/18, discussed with Dr. Shirlee LatchMcLean and advised patient to stop Brilinta on 10/28/17. Made sure the patient knew to continue ASA, he verbalized understanding.  - Continue statin.   2. Chronic systolic CHF: EF 16%10% on 10/17 echo, down from 35-40% in 10/16.  Ischemic cardiomyopathy.  Now s/p PCI as above.  He had low output HF with volume overload on RHC down in 10/17.  Most recent Echo in 03/2017 with EF 35-40%.  - NYHA I - Continue lasix 40 mg daily.  - Continue Entresto 49/51 mg BID - Continue Coreg 6.25 mg BID, as above he was unable to tolerate higher doses.  - Continue Spiro 50 mg daily.    3. Suspect OSA:  - Does not have insurance. Needs sleep study once he gets this.   Follow up in 3 months.   Little Ishikawarin E Smith 10/20/2017

## 2017-10-20 NOTE — Patient Instructions (Signed)
Routine lab work today. Will notify you of abnormal results, otherwise no news is good news!  STOP Brilinta on Tuesday October 30th.  Follow up 3 months. We will call you closer to this time, or you may call our office to schedule 1 month before you are due to be seen. Take all medication as prescribed the day of your appointment. Bring all medications with you to your appointment.  Do the following things EVERYDAY: 1) Weigh yourself in the morning before breakfast. Write it down and keep it in a log. 2) Take your medicines as prescribed 3) Eat low salt foods-Limit salt (sodium) to 2000 mg per day.  4) Stay as active as you can everyday 5) Limit all fluids for the day to less than 2 liters

## 2017-10-22 ENCOUNTER — Telehealth (HOSPITAL_COMMUNITY): Payer: Self-pay | Admitting: *Deleted

## 2017-10-22 NOTE — Telephone Encounter (Signed)
Pt called stating he was told during his office visit on 10/22 with Denny Peonrin he was to stop Brilinta on 10/30. He runs out of Brilinta on Saturday. Patient wants to know if it is ok to stop on Saturday.  [facility]

## 2017-10-24 NOTE — Telephone Encounter (Signed)
Ok to stop on Saturday.

## 2017-11-01 ENCOUNTER — Other Ambulatory Visit: Payer: Self-pay | Admitting: Family Medicine

## 2017-11-01 DIAGNOSIS — I2510 Atherosclerotic heart disease of native coronary artery without angina pectoris: Secondary | ICD-10-CM

## 2017-11-07 ENCOUNTER — Ambulatory Visit: Payer: Self-pay | Admitting: Family Medicine

## 2017-11-17 MED FILL — SPIRONOLACTONE 50 MG TAB: 50 | 34 days supply | Qty: 34 | Fill #2

## 2017-11-17 MED FILL — DIGOXIN 0.125 MG TABLET: 125 | 34 days supply | Qty: 34 | Fill #2

## 2017-11-17 MED FILL — OMEPRAZOLE 20 MG CAP: 20 | 30 days supply | Qty: 30 | Fill #11

## 2017-11-17 MED FILL — CARVEDILOL 12.5 MG TABLET: 12.5 | 34 days supply | Qty: 68 | Fill #2

## 2017-11-17 MED FILL — ATORVASTATIN 40 MG TABLET: 40 | 34 days supply | Qty: 34 | Fill #1

## 2017-11-25 ENCOUNTER — Telehealth: Payer: Self-pay | Admitting: Family Medicine

## 2017-11-25 NOTE — Telephone Encounter (Signed)
He only received 14 tablets to last him until his appt, which he canceled. No new appt scheduled, last visit was 11/29/16. Will forward to Dr. Venetia NightAmao for review.

## 2017-11-25 NOTE — Telephone Encounter (Signed)
Patient called asking for a refill on the gilipized the last prescription only had 14 pills. Please fu with patient. Also please send to [facility] on [address] [place]. Thank you.

## 2017-11-25 NOTE — Telephone Encounter (Signed)
Denied.  He needs an office visit

## 2017-11-26 ENCOUNTER — Other Ambulatory Visit (HOSPITAL_COMMUNITY): Payer: Self-pay | Admitting: *Deleted

## 2017-11-26 ENCOUNTER — Telehealth: Payer: Self-pay | Admitting: Family Medicine

## 2017-11-26 DIAGNOSIS — I2510 Atherosclerotic heart disease of native coronary artery without angina pectoris: Secondary | ICD-10-CM

## 2017-11-26 MED ORDER — GLIPIZIDE 10 MG PO TABS: ORAL_TABLET | ORAL | 0 refills | Status: DC

## 2017-11-26 NOTE — Telephone Encounter (Signed)
I called patient to inform him that they cannot fill the medication until he is seen. He became very angry and said that he was just see at the heart doctor and he wasn't going to keep missing work for a us to bill him. He also was mad that the doctor did not call him. He stated he would find a new doctor.

## 2017-12-01 ENCOUNTER — Telehealth: Payer: Self-pay | Admitting: Licensed Clinical Social Worker

## 2017-12-01 NOTE — Telephone Encounter (Signed)
CSW received message to contact patient as he is requesting to switch PCP. CSW attempted to return call with message left. Lasandra BeechJackie Kazi Reppond, LCSW, CCSW-MCS [phone]

## 2017-12-17 ENCOUNTER — Other Ambulatory Visit (HOSPITAL_COMMUNITY): Payer: Self-pay | Admitting: Cardiology

## 2017-12-17 MED FILL — ATORVASTATIN 40 MG TABLET: 40 | 34 days supply | Qty: 34 | Fill #2

## 2017-12-18 ENCOUNTER — Other Ambulatory Visit (HOSPITAL_COMMUNITY): Payer: Self-pay | Admitting: *Deleted

## 2017-12-18 MED ORDER — OMEPRAZOLE 20 MG PO CPDR: 20.0000 mg | DELAYED_RELEASE_CAPSULE | Freq: Every day | ORAL | 11 refills | Status: DC

## 2017-12-18 MED ORDER — DIGOXIN 125 MCG PO TABS: 0.1250 mg | ORAL_TABLET | Freq: Every day | ORAL | 2 refills | Status: DC

## 2017-12-18 MED ORDER — SPIRONOLACTONE 50 MG PO TABS: 50.0000 mg | ORAL_TABLET | Freq: Every day | ORAL | 2 refills | Status: DC

## 2017-12-18 MED ORDER — FUROSEMIDE 40 MG PO TABS: 40.0000 mg | ORAL_TABLET | Freq: Every day | ORAL | 2 refills | Status: DC

## 2017-12-18 MED FILL — OMEPRAZOLE 20 MG CAP: 20 | 30 days supply | Qty: 30 | Fill #0

## 2017-12-19 ENCOUNTER — Other Ambulatory Visit (HOSPITAL_COMMUNITY): Payer: Self-pay | Admitting: Cardiology

## 2017-12-19 MED FILL — SPIRONOLACTONE 50 MG TAB: 50 | 34 days supply | Qty: 34 | Fill #0

## 2017-12-19 MED FILL — CARVEDILOL 12.5 MG TABLET: 12.5 | 34 days supply | Qty: 34 | Fill #0

## 2017-12-19 MED FILL — DIGOXIN 0.125 MG TABLET: 125 | 34 days supply | Qty: 34 | Fill #0

## 2018-01-13 MED FILL — FUROSEMIDE 40 MG TAB: 40 | 100 days supply | Qty: 100 | Fill #1

## 2018-01-21 MED FILL — DIGOXIN 0.125 MG TABLET: 125 | 34 days supply | Qty: 34 | Fill #1

## 2018-01-21 MED FILL — SPIRONOLACTONE 25 MG TABLET: 25 | 34 days supply | Qty: 68 | Fill #0

## 2018-01-21 MED FILL — OMEPRAZOLE 20 MG CAP: 20 | 30 days supply | Qty: 30 | Fill #1

## 2018-02-23 ENCOUNTER — Other Ambulatory Visit (HOSPITAL_COMMUNITY): Payer: Self-pay | Admitting: *Deleted

## 2018-02-23 DIAGNOSIS — I2510 Atherosclerotic heart disease of native coronary artery without angina pectoris: Secondary | ICD-10-CM

## 2018-02-23 MED ORDER — GLIPIZIDE 10 MG PO TABS: ORAL_TABLET | ORAL | 0 refills | Status: DC

## 2018-02-24 ENCOUNTER — Telehealth: Payer: Self-pay | Admitting: Licensed Clinical Social Worker

## 2018-02-24 ENCOUNTER — Other Ambulatory Visit (HOSPITAL_COMMUNITY): Payer: Self-pay | Admitting: Cardiology

## 2018-02-24 MED FILL — DIGOXIN 0.125 MG TABLET: 125 | 34 days supply | Qty: 34 | Fill #2

## 2018-02-24 MED FILL — OMEPRAZOLE 20 MG CAP: 20 | 30 days supply | Qty: 30 | Fill #2

## 2018-02-24 MED FILL — CARVEDILOL 12.5 MG TABLET: 12.5 | 34 days supply | Qty: 34 | Fill #1

## 2018-02-24 MED FILL — SPIRONOLACTONE 25 MG TABLET: 25 | 34 days supply | Qty: 68 | Fill #1

## 2018-02-24 NOTE — Telephone Encounter (Signed)
CSW referred to assist patient with PCP. Patient states he previously went to [facility] although has had difficulty obtaining an appointment. CSW shared that it appears he has had some no shows and has not been seen for over a year or so. Patient would be most likely considered a new patient as he has not been seen for sometime. [facility] as [facility] to obtain new patient appointment. CSW encouraged patient to follow up asap as he is in need of a refill for his diabetic medication. Patient verbalizes understanding. CSW available as needed. Lasandra BeechJackie Kamali Sakata, LCSW, CCSW-MCS [phone]

## 2018-02-26 MED FILL — ATORVASTATIN 40 MG TABLET: 40 | 34 days supply | Qty: 34 | Fill #0

## 2018-03-03 ENCOUNTER — Encounter: Payer: Self-pay | Admitting: Family Medicine

## 2018-03-03 ENCOUNTER — Encounter (HOSPITAL_COMMUNITY): Payer: Self-pay

## 2018-03-03 ENCOUNTER — Ambulatory Visit: Payer: Self-pay | Attending: Family Medicine | Admitting: Family Medicine

## 2018-03-03 VITALS — BP 127/89 | HR 90 | Temp 97.7°F | Ht 78.0 in | Wt 293.8 lb

## 2018-03-03 DIAGNOSIS — I110 Hypertensive heart disease with heart failure: Secondary | ICD-10-CM | POA: Insufficient documentation

## 2018-03-03 DIAGNOSIS — Z6833 Body mass index (BMI) 33.0-33.9, adult: Secondary | ICD-10-CM | POA: Insufficient documentation

## 2018-03-03 DIAGNOSIS — D899 Disorder involving the immune mechanism, unspecified: Secondary | ICD-10-CM | POA: Insufficient documentation

## 2018-03-03 DIAGNOSIS — E349 Endocrine disorder, unspecified: Secondary | ICD-10-CM

## 2018-03-03 DIAGNOSIS — Z1159 Encounter for screening for other viral diseases: Secondary | ICD-10-CM

## 2018-03-03 DIAGNOSIS — I255 Ischemic cardiomyopathy: Secondary | ICD-10-CM | POA: Insufficient documentation

## 2018-03-03 DIAGNOSIS — E1169 Type 2 diabetes mellitus with other specified complication: Secondary | ICD-10-CM

## 2018-03-03 DIAGNOSIS — Z0000 Encounter for general adult medical examination without abnormal findings: Secondary | ICD-10-CM

## 2018-03-03 DIAGNOSIS — Z7982 Long term (current) use of aspirin: Secondary | ICD-10-CM | POA: Insufficient documentation

## 2018-03-03 DIAGNOSIS — E785 Hyperlipidemia, unspecified: Secondary | ICD-10-CM | POA: Insufficient documentation

## 2018-03-03 DIAGNOSIS — E119 Type 2 diabetes mellitus without complications: Secondary | ICD-10-CM | POA: Insufficient documentation

## 2018-03-03 DIAGNOSIS — L03012 Cellulitis of left finger: Secondary | ICD-10-CM | POA: Insufficient documentation

## 2018-03-03 DIAGNOSIS — Z955 Presence of coronary angioplasty implant and graft: Secondary | ICD-10-CM | POA: Insufficient documentation

## 2018-03-03 DIAGNOSIS — E291 Testicular hypofunction: Secondary | ICD-10-CM | POA: Insufficient documentation

## 2018-03-03 DIAGNOSIS — Z79899 Other long term (current) drug therapy: Secondary | ICD-10-CM | POA: Insufficient documentation

## 2018-03-03 DIAGNOSIS — I252 Old myocardial infarction: Secondary | ICD-10-CM | POA: Insufficient documentation

## 2018-03-03 DIAGNOSIS — Z7984 Long term (current) use of oral hypoglycemic drugs: Secondary | ICD-10-CM | POA: Insufficient documentation

## 2018-03-03 DIAGNOSIS — I5022 Chronic systolic (congestive) heart failure: Secondary | ICD-10-CM | POA: Insufficient documentation

## 2018-03-03 DIAGNOSIS — I2510 Atherosclerotic heart disease of native coronary artery without angina pectoris: Secondary | ICD-10-CM | POA: Insufficient documentation

## 2018-03-03 DIAGNOSIS — E669 Obesity, unspecified: Secondary | ICD-10-CM | POA: Insufficient documentation

## 2018-03-03 LAB — GLUCOSE, POCT (MANUAL RESULT ENTRY): POC GLUCOSE: 308 mg/dL — AB (ref 70–99)

## 2018-03-03 LAB — POCT GLYCOSYLATED HEMOGLOBIN (HGB A1C): HEMOGLOBIN A1C: 12.2

## 2018-03-03 MED ORDER — CEPHALEXIN 500 MG PO CAPS: 500.0000 mg | ORAL_CAPSULE | Freq: Two times a day (BID) | ORAL | 0 refills | Status: DC

## 2018-03-03 MED ORDER — DAPAGLIFLOZIN PROPANEDIOL 10 MG PO TABS: 10.0000 mg | ORAL_TABLET | Freq: Every day | ORAL | 3 refills | Status: DC

## 2018-03-03 MED FILL — CEPHALEXIN 500 MG CAPSULE: 500 | 10 days supply | Qty: 20 | Fill #0

## 2018-03-03 NOTE — Progress Notes (Signed)
Subjective:  Patient ID: [name], male    DOB: 1971-06-16  Age: 47 y.o. MRN: [id number]  CC: Diabetes  HPI [name] is a 47 year-old male with a PMH significant for T2DM, HTN, Chronic systolic heart failure (EF of 35-40% on 03/2017; previously 10% on 09/2017), testosterone deficiency, and HLD that presents today for follow-up and c/o of infection of his left thumb. Patient was lost to follow-up and has not been seen in the clinic for more than a year.  T2DM: Reports adherence with medication, his last prescription was refilled by his cardiologist. He takes glipizide daily but does not follow a diabetic diet. He drinks a lot of ginger ale at home. He ocassionally checks his blood sugar which he reports being around 150s post-prandial. Has not had an eye or foot exam in the past year. Denies visual changes or numbness and tingling of extremites.   CHF: He continues to follow-up with cardiology. On lasix, spirolactone, and carvedilol. Denies chest pain, leg swelling, or SOB.   Left Thumb Pain: Reports getting hurt a work x 2 weeks ago. Since then, he has had pain and redness of his left thumb nailbed. Does drain some yellow pus. Has used peroxide and triple antibiotic ointment with no relief. Denies fevers.    Past Medical History:  Diagnosis Date  . CAD (coronary artery disease)    NSTEMI 10/2011: LHC 11/04/11: pLAD 90%, mLAD 60-70%, small D2 sub totally occluded at ostium, small OM1 90% ostial, 90% mid, mOM2 30%, oPL 80%, RCA 30%, dPDA 80%, EF 20% with ant AK.  PCI:  Promus DES to pLAD.  [name] Chronic systolic heart failure ([place])   . DM2 (diabetes mellitus, type 2) ([place])   . HTN (hypertension)   . Hyperlipidemia   . Ischemic cardiomyopathy    Echo 11/03/11: mod LVH, mild focal basal septal hypertrophy, EF 15%, grade 2 diast dysfxn, mild MR, mild to mod LAE, mild RVE, mild to mod reduced RVSF.  EF 3/5 50% by echo  . Obesity    Past Surgical History:  Procedure Laterality Date   . CARDIAC CATHETERIZATION N/A 10/25/2016   Procedure: Right/Left Heart Cath and Coronary Angiography;  Surgeon: Leonie Man, MD;  Location: [place] [facility];  Service: Cardiovascular;  Laterality: N/A;  . CARDIAC CATHETERIZATION N/A 10/25/2016   Procedure: Coronary Stent Intervention;  Surgeon: Leonie Man, MD;  Location: [place] [facility];  Service: Cardiovascular;  Laterality: N/A;  . CARDIAC CATHETERIZATION N/A 10/28/2016   Procedure: Coronary Stent Intervention;  Surgeon: Burnell Blanks, MD;  Location: [place] [facility];  Service: Cardiovascular;  Laterality: N/A;  . LEFT AND RIGHT HEART CATHETERIZATION WITH CORONARY ANGIOGRAM N/A 11/04/2011   Procedure: LEFT AND RIGHT HEART CATHETERIZATION WITH CORONARY ANGIOGRAM;  Surgeon: Minus Breeding, MD;  Location: [facility] CATH LAB;  Service: Cardiovascular;  Laterality: N/A;  . NONE    . PERCUTANEOUS CORONARY STENT INTERVENTION (PCI-S)  11/04/2011   Procedure: PERCUTANEOUS CORONARY STENT INTERVENTION (PCI-S);  Surgeon: Sherren Mocha, MD;  Location: [facility] CATH LAB;  Service: Cardiovascular;;   No Known Allergies  Outpatient Medications Prior to Visit  Medication Sig Dispense Refill  . aspirin 81 MG chewable tablet Chew 1 tablet (81 mg total) by mouth daily. 30 tablet 6  . atorvastatin (LIPITOR) 40 MG tablet TAKE 1 TABLET BY MOUTH ONCE DAILY 34 tablet 2  . carvedilol (COREG) 12.5 MG tablet Take 0.5 tablets (6.25 mg total) by mouth 2 (two) times daily. 68 tablet 2  .  digoxin (LANOXIN) 0.125 MG tablet TAKE 1 TABLET BY MOUTH ONCE DAILY 34 tablet 2  . furosemide (LASIX) 40 MG tablet Take 1 tablet (40 mg total) by mouth daily. 30 tablet 2  . glipiZIDE (GLUCOTROL) 10 MG tablet TAKE 1 TABLET BY MOUTH TWICE DAILY BEFORE MEAL(S) 180 tablet 0  . omeprazole (PRILOSEC) 20 MG capsule Take 1 capsule (20 mg total) by mouth daily. 30 capsule 11  . sacubitril-valsartan (ENTRESTO) 97-103 MG Take 1 tablet by mouth 2 (two) times daily. 180 tablet 3  .  spironolactone (ALDACTONE) 50 MG tablet TAKE 1 TABLET BY MOUTH ONCE DAILY 34 tablet 2  . carvedilol (COREG) 6.25 MG tablet Take 6.25 mg by mouth 2 (two) times daily with a meal.    . sildenafil (REVATIO) 20 MG tablet Take 1 tablet (20 mg total) by mouth as needed (for erectile dysfunction). (Patient not taking: Reported on 03/03/2018) 30 tablet 0  . testosterone cypionate (DEPO-TESTOSTERONE) 200 MG/ML injection Inject 0.5 mLs (100 mg total) into the muscle every 28 (twenty-eight) days. (Patient not taking: Reported on 03/03/2018) 10 mL 3   No facility-administered medications prior to visit.    ROS Review of Systems  Constitutional: Negative for fatigue and fever.  HENT: Negative.   Respiratory: Negative for choking, chest tightness and shortness of breath.   Cardiovascular: Negative for chest pain, palpitations and leg swelling.  Gastrointestinal: Negative.   Musculoskeletal: Negative.   Skin: Positive for wound.       Pain and redness of left thumbnail. Reports drainage of pus.   Neurological: Negative for dizziness, light-headedness, numbness and headaches.  Psychiatric/Behavioral: Negative.     Objective:  BP 127/89   Pulse 90   Temp 97.7 F (36.5 C) (Oral)   Ht 6' 6" (1.981 m)   Wt 293 lb 12.8 oz (133.3 kg)   SpO2 98%   BMI 33.95 kg/m   BP/Weight 03/03/2018 11/91/4782 09/03/6212  Systolic BP 086 578 469  Diastolic BP 89 88 74  Wt. (Lbs) 293.8 288.6 290.25  BMI 33.95 33.35 33.54   Results for orders placed or performed in visit on 03/03/18  POCT glucose (manual entry)  Result Value Ref Range   POC Glucose 308 (A) 70 - 99 mg/dl  POCT glycosylated hemoglobin (Hb A1C)  Result Value Ref Range   Hemoglobin A1C 12.2     Physical Exam  Constitutional: He is oriented to person, place, and time. He appears well-developed and well-nourished. No distress.  HENT:  Head: Normocephalic and atraumatic.  Mouth/Throat: No oropharyngeal exudate.  Neck: Normal range of motion. Neck  supple. No JVD present.  Cardiovascular: Normal rate, regular rhythm, S1 normal, S2 normal and intact distal pulses. Exam reveals gallop and S4.  No pedal edema present.   Pulmonary/Chest: Effort normal and breath sounds normal. No respiratory distress.  Abdominal: Soft. Bowel sounds are normal.  Large abdomen.   Musculoskeletal: Normal range of motion. He exhibits no edema.  Neurological: He is alert and oriented to person, place, and time.  Skin: Skin is warm and dry.  Paronychia of left thumb  Psychiatric: He has a normal mood and affect. His behavior is normal.     Assessment & Plan:   1. Diabetes mellitus type 2 in obese (HCC) Uncontrolled. A1C elevated to 12.2 from 7.6.  Discussed lifestyle changes including reduction of sugary foods and exercise. Patient is not agreeable to injectables. Was placed on metformin in the past and could not tolerate GI symptoms.  Will continue glipizide daily  and start on Farxiga. - POCT glucose (manual entry) - POCT glycosylated hemoglobin (Hb A1C) - Microalbumin/Creatinine Ratio, Urine; Future - CMP14+EGFR; Future - Lipid panel; Future - dapagliflozin propanediol (FARXIGA) 10 MG TABS tablet; Take 10 mg by mouth daily.  Dispense: 30 tablet; Refill: 3  2. Coronary artery disease involving native coronary artery of native heart without angina pectoris Continue daily aspirin and statin. Will obtain Lipid Panel next week.  3. Healthcare maintenance Refused Tdap, PNA, and Flu vaccines at this time. Discussed diabetic eye exam and suggested [facility], which is cost effective (he does not have insurance). - Testosterone,Free and Total; Future  4. Screening for viral disease - HIV antibody (with reflex); Future  5. Chronic systolic (congestive) heart failure (HCC) On spirolactone and lasix. Continue to f/up with cardiology.   6. Testosterone deficiency Will obtain labs next week to determine if he needs to continue with testosterone  shots.  - CBC with Differential/Platelet; Future - PSA, total and free; Future  7. Paronychia of finger of left hand Immunocompromised. High risk for delayed healing due to uncontrolled T2DM. Will treat with antibiotics.  - cephALEXin (KEFLEX) 500 MG capsule; Take 1 capsule (500 mg total) by mouth 2 (two) times daily.  Dispense: 20 capsule; Refill: 0   Meds ordered this encounter  Medications  . dapagliflozin propanediol (FARXIGA) 10 MG TABS tablet    Sig: Take 10 mg by mouth daily.    Dispense:  30 tablet    Refill:  3  . cephALEXin (KEFLEX) 500 MG capsule    Sig: Take 1 capsule (500 mg total) by mouth 2 (two) times daily.    Dispense:  20 capsule    Refill:  0    Follow-up: Return in about 3 months (around 06/03/2018) for follow up of chronic medical conditions.   Jonnie Kind DNP student

## 2018-03-03 NOTE — Patient Instructions (Signed)
Diabetes Mellitus and Nutrition When you have diabetes (diabetes mellitus), it is very important to have healthy eating habits because your blood sugar (glucose) levels are greatly affected by what you eat and drink. Eating healthy foods in the appropriate amounts, at about the same times every day, can help you:  Control your blood glucose.  Lower your risk of heart disease.  Improve your blood pressure.  Reach or maintain a healthy weight.  Every person with diabetes is different, and each person has different needs for a meal plan. Your health care provider may recommend that you work with a diet and nutrition specialist (dietitian) to make a meal plan that is best for you. Your meal plan may vary depending on factors such as:  The calories you need.  The medicines you take.  Your weight.  Your blood glucose, blood pressure, and cholesterol levels.  Your activity level.  Other health conditions you have, such as heart or kidney disease.  How do carbohydrates affect me? Carbohydrates affect your blood glucose level more than any other type of food. Eating carbohydrates naturally increases the amount of glucose in your blood. Carbohydrate counting is a method for keeping track of how many carbohydrates you eat. Counting carbohydrates is important to keep your blood glucose at a healthy level, especially if you use insulin or take certain oral diabetes medicines. It is important to know how many carbohydrates you can safely have in each meal. This is different for every person. Your dietitian can help you calculate how many carbohydrates you should have at each meal and for snack. Foods that contain carbohydrates include:  Bread, cereal, rice, pasta, and crackers.  Potatoes and corn.  Peas, beans, and lentils.  Milk and yogurt.  Fruit and juice.  Desserts, such as cakes, cookies, ice cream, and candy.  How does alcohol affect me? Alcohol can cause a sudden decrease in blood  glucose (hypoglycemia), especially if you use insulin or take certain oral diabetes medicines. Hypoglycemia can be a life-threatening condition. Symptoms of hypoglycemia (sleepiness, dizziness, and confusion) are similar to symptoms of having too much alcohol. If your health care provider says that alcohol is safe for you, follow these guidelines:  Limit alcohol intake to no more than 1 drink per day for nonpregnant women and 2 drinks per day for men. One drink equals 12 oz of beer, 5 oz of wine, or 1 oz of hard liquor.  Do not drink on an empty stomach.  Keep yourself hydrated with water, diet soda, or unsweetened iced tea.  Keep in mind that regular soda, juice, and other mixers may contain a lot of sugar and must be counted as carbohydrates.  What are tips for following this plan? Reading food labels  Start by checking the serving size on the label. The amount of calories, carbohydrates, fats, and other nutrients listed on the label are based on one serving of the food. Many foods contain more than one serving per package.  Check the total grams (g) of carbohydrates in one serving. You can calculate the number of servings of carbohydrates in one serving by dividing the total carbohydrates by 15. For example, if a food has 30 g of total carbohydrates, it would be equal to 2 servings of carbohydrates.  Check the number of grams (g) of saturated and trans fats in one serving. Choose foods that have low or no amount of these fats.  Check the number of milligrams (mg) of sodium in one serving. Most people   should limit total sodium intake to less than 2,300 mg per day.  Always check the nutrition information of foods labeled as "low-fat" or "nonfat". These foods may be higher in added sugar or refined carbohydrates and should be avoided.  Talk to your dietitian to identify your daily goals for nutrients listed on the label. Shopping  Avoid buying canned, premade, or processed foods. These  foods tend to be high in fat, sodium, and added sugar.  Shop around the outside edge of the grocery store. This includes fresh fruits and vegetables, bulk grains, fresh meats, and fresh dairy. Cooking  Use low-heat cooking methods, such as baking, instead of high-heat cooking methods like deep frying.  Cook using healthy oils, such as olive, canola, or sunflower oil.  Avoid cooking with butter, cream, or high-fat meats. Meal planning  Eat meals and snacks regularly, preferably at the same times every day. Avoid going long periods of time without eating.  Eat foods high in fiber, such as fresh fruits, vegetables, beans, and whole grains. Talk to your dietitian about how many servings of carbohydrates you can eat at each meal.  Eat 4-6 ounces of lean protein each day, such as lean meat, chicken, fish, eggs, or tofu. 1 ounce is equal to 1 ounce of meat, chicken, or fish, 1 egg, or 1/4 cup of tofu.  Eat some foods each day that contain healthy fats, such as avocado, nuts, seeds, and fish. Lifestyle   Check your blood glucose regularly.  Exercise at least 30 minutes 5 or more days each week, or as told by your health care provider.  Take medicines as told by your health care provider.  Do not use any products that contain nicotine or tobacco, such as cigarettes and e-cigarettes. If you need help quitting, ask your health care provider.  Work with a counselor or diabetes educator to identify strategies to manage stress and any emotional and social challenges. What are some questions to ask my health care provider?  Do I need to meet with a diabetes educator?  Do I need to meet with a dietitian?  What number can I call if I have questions?  When are the best times to check my blood glucose? Where to find more information:  American Diabetes Association: diabetes.org/food-and-fitness/food  Academy of Nutrition and Dietetics:  www.eatright.org/resources/health/diseases-and-conditions/diabetes  National Institute of Diabetes and Digestive and Kidney Diseases (NIH): www.niddk.nih.gov/health-information/diabetes/overview/diet-eating-physical-activity Summary  A healthy meal plan will help you control your blood glucose and maintain a healthy lifestyle.  Working with a diet and nutrition specialist (dietitian) can help you make a meal plan that is best for you.  Keep in mind that carbohydrates and alcohol have immediate effects on your blood glucose levels. It is important to count carbohydrates and to use alcohol carefully. This information is not intended to replace advice given to you by your health care provider. Make sure you discuss any questions you have with your health care provider. Document Released: 09/12/2005 Document Revised: 01/20/2017 Document Reviewed: 01/20/2017 Elsevier Interactive Patient Education  2018 Elsevier Inc.  

## 2018-03-03 NOTE — Progress Notes (Signed)
Patient has puss coming out of his left thumb fingernail.

## 2018-03-06 ENCOUNTER — Telehealth (HOSPITAL_COMMUNITY): Payer: Self-pay | Admitting: Pharmacist

## 2018-03-06 NOTE — Telephone Encounter (Signed)
[employer] patient assistance approved for Entresto 97-103 mg BID through 03/07/19.   Tyler DeisErika K. Bonnye FavaNicolsen, PharmD, BCPS, CPP Clinical Pharmacist Phone: [phone] 03/06/2018 11:46 AM

## 2018-03-10 ENCOUNTER — Other Ambulatory Visit: Payer: Self-pay

## 2018-03-20 ENCOUNTER — Ambulatory Visit: Payer: Self-pay | Attending: Family Medicine

## 2018-03-20 DIAGNOSIS — E1169 Type 2 diabetes mellitus with other specified complication: Secondary | ICD-10-CM | POA: Insufficient documentation

## 2018-03-20 DIAGNOSIS — E669 Obesity, unspecified: Secondary | ICD-10-CM | POA: Insufficient documentation

## 2018-03-20 DIAGNOSIS — Z1159 Encounter for screening for other viral diseases: Secondary | ICD-10-CM | POA: Insufficient documentation

## 2018-03-20 DIAGNOSIS — E349 Endocrine disorder, unspecified: Secondary | ICD-10-CM | POA: Insufficient documentation

## 2018-03-20 DIAGNOSIS — Z0000 Encounter for general adult medical examination without abnormal findings: Secondary | ICD-10-CM | POA: Insufficient documentation

## 2018-03-20 MED FILL — FARXIGA 10 MG TABLET: 10 | 30 days supply | Qty: 30 | Fill #0

## 2018-03-20 NOTE — Progress Notes (Signed)
Patient here for lab visit only 

## 2018-03-21 LAB — CMP14+EGFR
ALBUMIN: 4.4 g/dL (ref 3.5–5.5)
ALK PHOS: 53 IU/L (ref 39–117)
ALT: 23 IU/L (ref 0–44)
AST: 16 IU/L (ref 0–40)
Albumin/Globulin Ratio: 1.7 (ref 1.2–2.2)
BUN / CREAT RATIO: 19 (ref 9–20)
BUN: 24 mg/dL (ref 6–24)
Bilirubin Total: 0.5 mg/dL (ref 0.0–1.2)
CO2: 22 mmol/L (ref 20–29)
CREATININE: 1.28 mg/dL — AB (ref 0.76–1.27)
Calcium: 9.5 mg/dL (ref 8.7–10.2)
Chloride: 99 mmol/L (ref 96–106)
GFR calc non Af Amer: 68 mL/min/{1.73_m2} (ref >59)
GFR, EST AFRICAN AMERICAN: 78 mL/min/{1.73_m2} (ref >59)
GLOBULIN, TOTAL: 2.6 g/dL (ref 1.5–4.5)
Glucose: 267 mg/dL — ABNORMAL HIGH (ref 65–99)
Potassium: 4.5 mmol/L (ref 3.5–5.2)
SODIUM: 139 mmol/L (ref 134–144)
Total Protein: 7 g/dL (ref 6.0–8.5)

## 2018-03-21 LAB — CBC WITH DIFFERENTIAL/PLATELET
Basophils Absolute: 0 10*3/uL (ref 0.0–0.2)
Basos: 0 %
EOS (ABSOLUTE): 0.2 10*3/uL (ref 0.0–0.4)
EOS: 3 %
HEMATOCRIT: 45 % (ref 37.5–51.0)
Hemoglobin: 14.9 g/dL (ref 13.0–17.7)
IMMATURE GRANULOCYTES: 0 %
Immature Grans (Abs): 0 10*3/uL (ref 0.0–0.1)
LYMPHS: 32 %
Lymphocytes Absolute: 2 10*3/uL (ref 0.7–3.1)
MCH: 27.6 pg (ref 26.6–33.0)
MCHC: 33.1 g/dL (ref 31.5–35.7)
MCV: 83 fL (ref 79–97)
Monocytes Absolute: 0.5 10*3/uL (ref 0.1–0.9)
Monocytes: 8 %
Neutrophils Absolute: 3.6 10*3/uL (ref 1.4–7.0)
Neutrophils: 57 %
Platelets: 203 10*3/uL (ref 150–379)
RBC: 5.4 x10E6/uL (ref 4.14–5.80)
RDW: 13.6 % (ref 12.3–15.4)
WBC: 6.3 10*3/uL (ref 3.4–10.8)

## 2018-03-21 LAB — TESTOSTERONE,FREE AND TOTAL
TESTOSTERONE FREE: 12.3 pg/mL (ref 6.8–21.5)
Testosterone: 308 ng/dL (ref 264–916)

## 2018-03-21 LAB — PSA, TOTAL AND FREE
PSA, Free Pct: 51.7 %
PSA, Free: 0.31 ng/mL
Prostate Specific Ag, Serum: 0.6 ng/mL (ref 0.0–4.0)

## 2018-03-21 LAB — LIPID PANEL
CHOLESTEROL TOTAL: 160 mg/dL (ref 100–199)
Chol/HDL Ratio: 4.7 ratio (ref 0.0–5.0)
HDL: 34 mg/dL — ABNORMAL LOW (ref >39)
LDL Calculated: 94 mg/dL (ref 0–99)
Triglycerides: 161 mg/dL — ABNORMAL HIGH (ref 0–149)
VLDL CHOLESTEROL CAL: 32 mg/dL (ref 5–40)

## 2018-03-21 LAB — MICROALBUMIN / CREATININE URINE RATIO
Creatinine, Urine: 14.1 mg/dL
Microalb/Creat Ratio: <21.3 mg/g creat (ref 0.0–30.0)
Microalbumin, Urine: <3 ug/mL

## 2018-03-21 LAB — HIV ANTIBODY (ROUTINE TESTING W REFLEX): HIV SCREEN 4TH GENERATION: NONREACTIVE

## 2018-03-23 ENCOUNTER — Telehealth: Payer: Self-pay | Admitting: Family Medicine

## 2018-03-23 NOTE — Telephone Encounter (Signed)
Pt called to receive lab results,Please follow up

## 2018-03-24 ENCOUNTER — Telehealth: Payer: Self-pay

## 2018-03-24 NOTE — Telephone Encounter (Signed)
Patient was called and given lab results. 

## 2018-03-24 NOTE — Telephone Encounter (Signed)
Patient was called and informed of lab results. 

## 2018-03-31 ENCOUNTER — Other Ambulatory Visit (HOSPITAL_COMMUNITY): Payer: Self-pay | Admitting: Internal Medicine

## 2018-03-31 MED FILL — OMEPRAZOLE 20 MG CAP: 20 | 30 days supply | Qty: 30 | Fill #3

## 2018-04-02 MED FILL — DIGOXIN 0.125 MG TABLET: 125 | 34 days supply | Qty: 34 | Fill #0

## 2018-04-06 ENCOUNTER — Other Ambulatory Visit (HOSPITAL_COMMUNITY): Payer: Self-pay | Admitting: *Deleted

## 2018-04-06 MED ORDER — DIGOXIN 125 MCG PO TABS: 125.0000 ug | ORAL_TABLET | Freq: Every day | ORAL | 2 refills | Status: DC

## 2018-04-28 ENCOUNTER — Other Ambulatory Visit (HOSPITAL_COMMUNITY): Payer: Self-pay | Admitting: Internal Medicine

## 2018-04-28 MED FILL — FUROSEMIDE 40 MG TAB: 40 | 100 days supply | Qty: 100 | Fill #2

## 2018-05-01 MED FILL — SPIRONOLACTONE 50 MG TAB: 50 | 30 days supply | Qty: 30 | Fill #0

## 2018-05-04 MED FILL — OMEPRAZOLE 20 MG CAP: 20 | 30 days supply | Qty: 30 | Fill #4

## 2018-05-04 MED FILL — DIGOXIN 0.125 MG TABLET: 125 | 34 days supply | Qty: 34 | Fill #1

## 2018-05-28 ENCOUNTER — Encounter (HOSPITAL_COMMUNITY): Payer: Self-pay

## 2018-06-03 MED FILL — ATORVASTATIN 40 MG TABLET: 40 | 34 days supply | Qty: 34 | Fill #1

## 2018-06-03 MED FILL — DIGOXIN 0.125 MG TABLET: 125 | 34 days supply | Qty: 34 | Fill #2

## 2018-06-03 MED FILL — OMEPRAZOLE 20 MG CAP: 20 | 30 days supply | Qty: 30 | Fill #5

## 2018-06-03 MED FILL — SPIRONOLACTONE 50 MG TAB: 50 | 30 days supply | Qty: 30 | Fill #1

## 2018-06-05 ENCOUNTER — Ambulatory Visit: Payer: Self-pay | Admitting: Family Medicine

## 2018-06-08 ENCOUNTER — Ambulatory Visit: Payer: Self-pay | Admitting: Family Medicine

## 2018-06-09 ENCOUNTER — Other Ambulatory Visit (HOSPITAL_COMMUNITY): Payer: Self-pay | Admitting: Cardiology

## 2018-06-09 ENCOUNTER — Telehealth: Payer: Self-pay | Admitting: Family Medicine

## 2018-06-09 ENCOUNTER — Other Ambulatory Visit: Payer: Self-pay

## 2018-06-09 DIAGNOSIS — I2510 Atherosclerotic heart disease of native coronary artery without angina pectoris: Secondary | ICD-10-CM

## 2018-06-09 MED ORDER — GLIPIZIDE 10 MG PO TABS: ORAL_TABLET | ORAL | 1 refills | Status: DC

## 2018-06-09 NOTE — Telephone Encounter (Signed)
Patient came into the office and requested for listed medications to be refilled and sent to [id number] - [place] ([place]), [place] - [address]  glipiZIDE (GLUCOTROL) 10 MG tablet [[id number]][[id number]]

## 2018-06-09 NOTE — Telephone Encounter (Signed)
Refilled Glipizide to requested pharmacy.

## 2018-06-23 ENCOUNTER — Other Ambulatory Visit (HOSPITAL_COMMUNITY): Payer: Self-pay

## 2018-06-23 MED ORDER — SACUBITRIL-VALSARTAN 97-103 MG PO TABS: 1.0000 | ORAL_TABLET | Freq: Two times a day (BID) | ORAL | 3 refills | Status: DC

## 2018-07-05 NOTE — Progress Notes (Signed)
[facility] Note  PCP: Dr. Venetia NightAmao Cardiology: Dr. Antoine PocheHochrein HF Cardiology: Dr. Otelia SergeantMcLean  Glenn Leota JacobsenFountain is a 47 y.o. male with history of CAD and ischemic cardiomyopathy.  He initially had NSTEMI in 10/12 with DES to proximal LAD. At that time, EF was noted to be down to 15%.  Over time, EF improved to 50% (3/13 echo).    Echo in 10/17 showed EF back down to 10% with severe functional MR.  He was taken for cardiac cath.  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  He had DES x 2 to LCx/OM2 and had a staged procedure with DES to distal RCA/ostial PDA.  RHC showed elevated filling pressure and low cardiac output.  While in the hospital, he was on milrinone gtt and was diuresed.  Eventually, we were able to titrate him off milrinone and discharged him home on a po regimen.   He returns today for HF follow up. Overall doing well. Denies SOB, orthopnea, PND, or edema. 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 Denies CP or dizziness. Energy level is okay. He still does not have insurance, but plans on signing up through work. He has lost 10 lbs since last visit by watching carb intake and avoiding fast food. Limits salt and fluid. Taking all medications.   Review of systems complete and found to be negative unless listed in HPI.   PMH:  1. CAD: NSTEMI 10/12 with DES to proximal LAD.  - LHC (10/25/16): subtotal occlusion LCx/OM2, 80% dRCA/ostial PCA => DES x 2 to LCx/OM2.  On 10/30, staged procedure with DES to dRCA/ostial PDA.   2. Chronic systolic CHF: Ischemic cardiomyopathy.   - 11/12 echo with EF 15%. - Echo (3/13) with EF 50% - Echo (10/16) with EF 35-40% - Echo (10/17) with EF 10%, severe MR (appears functional).  - RHC (10/17) with mean RA 20, PA 73/36, mean PCWP 28, CI 1.5 Fick/1.74 thermo 3. Type II diabetes 4. HTN 5. Hyperlipidemia 6. Obesity 7. OSA: Suspected.   Social History   Socioeconomic  History  . Marital status: Single    Spouse name: Not on file  . Number of children: Not on file  . Years of education: Not on file  . Highest education level: Not on file  Occupational History  . Not on file  Social Needs  . Financial resource strain: Not on file  . Food insecurity:    Worry: Not on file    Inability: Not on file  . Transportation needs:    Medical: Not on file    Non-medical: Not on file  Tobacco Use  . Smoking status: Never Smoker  . Smokeless tobacco: Never Used  Substance and Sexual Activity  . Alcohol use: Yes    Alcohol/week: 0.6 oz    Types: 1 Shots of liquor per week    Comment: OCCASIONAL  . Drug use: No  . Sexual activity: Yes  Lifestyle  . Physical activity:    Days per week: Not on file    Minutes per session: Not on file  . Stress: Not on file  Relationships  . Social connections:    Talks on phone: Not on file    Gets together: Not on file    Attends religious service: Not on file    Active member of club or organization: Not on file    Attends meetings of clubs or organizations: Not on file  Relationship status: Not on file  . Intimate partner violence:    Fear of current or ex partner: Not on file    Emotionally abused: Not on file    Physically abused: Not on file    Forced sexual activity: Not on file  Other Topics Concern  . Not on file  Social History Narrative  . Not on file   Family History  Problem Relation Age of Onset  . Heart disease Mother        MOTHER HAD CABG     Current Outpatient Medications  Medication Sig Dispense Refill  . aspirin 81 MG chewable tablet Chew 1 tablet (81 mg total) by mouth daily. 30 tablet 6  . atorvastatin (LIPITOR) 40 MG tablet TAKE 1 TABLET BY MOUTH ONCE DAILY 34 tablet 2  . carvedilol (COREG) 12.5 MG tablet Take 12.5 mg by mouth daily.    . dapagliflozin propanediol (FARXIGA) 10 MG TABS tablet Take 10 mg by mouth daily. 30 tablet 3  . digoxin (LANOXIN) 0.125 MG tablet Take 1 tablet  (125 mcg total) by mouth daily. 34 tablet 2  . furosemide (LASIX) 40 MG tablet Take 1 tablet (40 mg total) by mouth daily. 30 tablet 2  . glipiZIDE (GLUCOTROL) 10 MG tablet TAKE 1 TABLET BY MOUTH TWICE DAILY BEFORE MEAL(S) 180 tablet 1  . omeprazole (PRILOSEC) 20 MG capsule Take 1 capsule (20 mg total) by mouth daily. 30 capsule 11  . sacubitril-valsartan (ENTRESTO) 97-103 MG Take 1 tablet by mouth 2 (two) times daily. 180 tablet 3  . spironolactone (ALDACTONE) 50 MG tablet TAKE 1 TABLET BY MOUTH ONCE DAILY 34 tablet 2  . testosterone cypionate (DEPO-TESTOSTERONE) 200 MG/ML injection Inject 0.5 mLs (100 mg total) into the muscle every 28 (twenty-eight) days. 10 mL 3  . sildenafil (REVATIO) 20 MG tablet Take 1 tablet (20 mg total) by mouth as needed (for erectile dysfunction). (Patient not taking: Reported on 03/03/2018) 30 tablet 0   No current facility-administered medications for this encounter.    BP 106/88   Pulse 83   Wt 283 lb 9.6 oz (128.6 kg)   SpO2 99%   BMI 32.77 kg/m  Wt Readings from Last 3 Encounters:  07/06/18 283 lb 9.6 oz (128.6 kg)  03/03/18 293 lb 12.8 oz (133.3 kg)  10/20/17 288 lb 9.6 oz (130.9 kg)    General:Obese. No resp difficulty. HEENT: Normal Neck: Supple. JVP difficult, but does not appear elevated. Carotids 2+ bilat; no bruits. No thyromegaly or nodule noted. Cor: PMI nondisplaced. RRR, No M/G/R noted Lungs: CTAB, normal effort. Abdomen: Soft, non-tender, non-distended, no HSM. No bruits or masses. +BS  Extremities: No cyanosis, clubbing, or rash. R and LLE no edema.  Neuro: Alert & orientedx3, cranial nerves grossly intact. moves all 4 extremities w/o difficulty. Affect pleasant   Assessment/Plan: 1. CAD: Cath in 10/17 after EF found to have significantly dropped.  He had DES x 2 to the LCx system and DES x 1 to RCA.  Hopefully this will allow some improvement in his LV systolic function, but doubt that this will normalize it.   - Continue ASA and  statin. Now off Brilinta (1 year post PCI 09/2018) - No s/s ischemia.   2. Chronic systolic CHF: EF 16% on 10/17 echo, down from 35-40% in 10/16.  Ischemic cardiomyopathy.  Now s/p PCI as above.  He had low output HF with volume overload on RHC down in 10/17.   - Echo in 03/2017 with EF 35-40%.  -  NYHA I - Volume status stable - Continue lasix 40 mg daily. Check BMET today - Continue Entresto 97/103 mg BID - Continue Coreg 6.25 mg BID, unable to tolerate higher doses. Declines retry.  - Continue Spiro 50 mg daily.  - Continue digoxin 0.125 mg daily. Check dig level today - Repeat echo at next visit   3. Suspect OSA:  - He still does not have insurance. Needs sleep study once he gets this. He plans to sign up for next year through work.    4. Obesity - Body mass index is 32.77 kg/m. - Has lost 10 lbs by limiting carbs and avoiding fast food. Plans to lose 30 more lbs. Congratulated on weight loss.  Medication refills provided through HF fund.  BMET today Follow up in 3-4 months with echo with Dr Jeannette How, NP 07/06/2018  Greater than 50% of the 25 minute visit was spent in counseling/coordination of care regarding disease state education, salt/fluid restriction, sliding scale diuretics, and medication compliance.

## 2018-07-06 ENCOUNTER — Other Ambulatory Visit (HOSPITAL_COMMUNITY): Payer: Self-pay | Admitting: Cardiology

## 2018-07-06 ENCOUNTER — Ambulatory Visit (HOSPITAL_COMMUNITY)
Admission: RE | Admit: 2018-07-06 | Discharge: 2018-07-06 | Disposition: A | Discharge status: Home / Self Care | Payer: Self-pay | Source: Ambulatory Visit | Attending: Cardiology | Admitting: Cardiology

## 2018-07-06 ENCOUNTER — Other Ambulatory Visit (HOSPITAL_COMMUNITY): Payer: Self-pay | Admitting: Internal Medicine

## 2018-07-06 ENCOUNTER — Encounter (HOSPITAL_COMMUNITY): Payer: Self-pay

## 2018-07-06 VITALS — BP 106/88 | HR 83 | Wt 283.6 lb

## 2018-07-06 DIAGNOSIS — E785 Hyperlipidemia, unspecified: Secondary | ICD-10-CM | POA: Insufficient documentation

## 2018-07-06 DIAGNOSIS — Z955 Presence of coronary angioplasty implant and graft: Secondary | ICD-10-CM | POA: Insufficient documentation

## 2018-07-06 DIAGNOSIS — Z6832 Body mass index (BMI) 32.0-32.9, adult: Secondary | ICD-10-CM | POA: Insufficient documentation

## 2018-07-06 DIAGNOSIS — E669 Obesity, unspecified: Secondary | ICD-10-CM | POA: Insufficient documentation

## 2018-07-06 DIAGNOSIS — Z8249 Family history of ischemic heart disease and other diseases of the circulatory system: Secondary | ICD-10-CM | POA: Insufficient documentation

## 2018-07-06 DIAGNOSIS — I252 Old myocardial infarction: Secondary | ICD-10-CM | POA: Insufficient documentation

## 2018-07-06 DIAGNOSIS — E119 Type 2 diabetes mellitus without complications: Secondary | ICD-10-CM | POA: Insufficient documentation

## 2018-07-06 DIAGNOSIS — Z7982 Long term (current) use of aspirin: Secondary | ICD-10-CM | POA: Insufficient documentation

## 2018-07-06 DIAGNOSIS — Z79899 Other long term (current) drug therapy: Secondary | ICD-10-CM | POA: Insufficient documentation

## 2018-07-06 DIAGNOSIS — Z7984 Long term (current) use of oral hypoglycemic drugs: Secondary | ICD-10-CM | POA: Insufficient documentation

## 2018-07-06 DIAGNOSIS — I5022 Chronic systolic (congestive) heart failure: Secondary | ICD-10-CM

## 2018-07-06 DIAGNOSIS — I2510 Atherosclerotic heart disease of native coronary artery without angina pectoris: Secondary | ICD-10-CM

## 2018-07-06 DIAGNOSIS — I255 Ischemic cardiomyopathy: Secondary | ICD-10-CM

## 2018-07-06 DIAGNOSIS — I110 Hypertensive heart disease with heart failure: Secondary | ICD-10-CM | POA: Insufficient documentation

## 2018-07-06 LAB — BASIC METABOLIC PANEL
ANION GAP: 8 (ref 5–15)
BUN: 26 mg/dL — ABNORMAL HIGH (ref 6–20)
CALCIUM: 9.5 mg/dL (ref 8.9–10.3)
CO2: 27 mmol/L (ref 22–32)
Chloride: 104 mmol/L (ref 98–111)
Creatinine, Ser: 1.5 mg/dL — ABNORMAL HIGH (ref 0.61–1.24)
GFR, EST NON AFRICAN AMERICAN: 55 mL/min — AB (ref >60)
GLUCOSE: 246 mg/dL — AB (ref 70–99)
Potassium: 4.1 mmol/L (ref 3.5–5.1)
Sodium: 139 mmol/L (ref 135–145)

## 2018-07-06 LAB — DIGOXIN LEVEL: Digoxin Level: 0.5 ng/mL — ABNORMAL LOW (ref 0.8–2.0)

## 2018-07-06 MED ORDER — DIGOXIN 125 MCG PO TABS: 125.0000 ug | ORAL_TABLET | Freq: Every day | ORAL | 2 refills | Status: DC

## 2018-07-06 MED ORDER — SPIRONOLACTONE 50 MG PO TABS: 50.0000 mg | ORAL_TABLET | Freq: Every day | ORAL | 2 refills | Status: DC

## 2018-07-06 MED FILL — SPIRONOLACTONE 50 MG TAB: 50 | 30 days supply | Qty: 30 | Fill #0

## 2018-07-06 MED FILL — OMEPRAZOLE 20 MG CAP: 20 | 30 days supply | Qty: 30 | Fill #6

## 2018-07-06 MED FILL — DIGOXIN 0.125 MG TABLET: 125 | 34 days supply | Qty: 34 | Fill #0

## 2018-07-06 NOTE — Patient Instructions (Signed)
Routine lab work today. Will notify you of abnormal results, otherwise no news is good news!  No changes to medication at this time.  Refills sent for Spironolactone and digoxin. Prescription has been [facility] to [facility] under the [facility] Failure Fund. Address: [address], [place], [place] [address]  Phone: [phone] Located next to [facility].  Follow up 3-4 months with Dr. Shirlee LatchMcLean and echocardiogram. We will call you closer to this time, or you may call our office to schedule 1 month before you are due to be seen. Take all medication as prescribed the day of your appointment. Bring all medications with you to your appointment.  Do the following things EVERYDAY: 1) Weigh yourself in the morning before breakfast. Write it down and keep it in a log. 2) Take your medicines as prescribed 3) Eat low salt foods-Limit salt (sodium) to 2000 mg per day.  4) Stay as active as you can everyday 5) Limit all fluids for the day to less than 2 liters

## 2018-07-28 ENCOUNTER — Ambulatory Visit: Payer: Self-pay | Admitting: Family Medicine

## 2018-08-07 ENCOUNTER — Other Ambulatory Visit (HOSPITAL_COMMUNITY): Payer: Self-pay | Admitting: Cardiology

## 2018-08-07 MED FILL — FUROSEMIDE 40 MG TAB: 40 | 90 days supply | Qty: 90 | Fill #0

## 2018-08-07 MED FILL — ATORVASTATIN 40 MG TABLET: 40 | 34 days supply | Qty: 34 | Fill #2

## 2018-08-07 MED FILL — OMEPRAZOLE 20 MG CAP: 20 | 30 days supply | Qty: 30 | Fill #7

## 2018-08-07 MED FILL — SPIRONOLACTONE 50 MG TAB: 50 | 30 days supply | Qty: 30 | Fill #1

## 2018-08-07 MED FILL — DIGOXIN 0.125 MG TABLET: 125 | 34 days supply | Qty: 34 | Fill #1

## 2018-09-14 MED FILL — SPIRONOLACTONE 50 MG TAB: 50 | 30 days supply | Qty: 30 | Fill #2

## 2018-09-14 MED FILL — OMEPRAZOLE 20 MG CPDR: 20 | 30 days supply | Qty: 30 | Fill #8

## 2018-09-30 ENCOUNTER — Other Ambulatory Visit (HOSPITAL_COMMUNITY): Payer: Self-pay | Admitting: Cardiology

## 2018-10-16 MED FILL — OMEPRAZOLE 20 MG CPDR: 20 | 30 days supply | Qty: 30 | Fill #9

## 2018-10-16 MED FILL — DIGOXIN 0.125 MG TABLET: 125 | 34 days supply | Qty: 34 | Fill #2

## 2018-10-16 MED FILL — ATORVASTATIN 40 MG TABLET: 40 | 30 days supply | Qty: 30 | Fill #0

## 2018-10-16 MED FILL — SPIRONOLACTONE 50 MG TABLET: 50 | 30 days supply | Qty: 30 | Fill #3

## 2018-10-27 ENCOUNTER — Telehealth (HOSPITAL_COMMUNITY): Payer: Self-pay | Admitting: Licensed Clinical Social Worker

## 2018-10-27 NOTE — Telephone Encounter (Signed)
CSW called patient to inquire about current insurance status.  Patient reports that he is still uninsured at this time and has previously been denied for Medicaid due to his income being too high.  Patient reports that he should have access to insurance through his job starting at the beginning of next year but it is not guaranteed.  Patient agreeable to looking into insurance through the [employer]- CSW emailed information about signing up through marketplace to patient for review.  Burna Sis, LCSW Clinical Social Worker [facility] [phone]

## 2018-11-19 ENCOUNTER — Telehealth (HOSPITAL_COMMUNITY): Payer: Self-pay

## 2018-11-19 MED FILL — SPIRONOLACTONE 50 MG TABLET: 50 | 34 days supply | Qty: 34 | Fill #0

## 2018-11-19 MED FILL — ATORVASTATIN 40 MG TABLET: 40 | 30 days supply | Qty: 30 | Fill #1

## 2018-11-19 MED FILL — DIGOXIN 0.125 MG TABLET: 125 | 34 days supply | Qty: 34 | Fill #0

## 2018-11-19 MED FILL — CARVEDILOL 12.5 MG TABLET: 12.5 | 34 days supply | Qty: 34 | Fill #2

## 2018-11-19 MED FILL — OMEPRAZOLE 20 MG CPDR: 20 | 30 days supply | Qty: 30 | Fill #10

## 2018-11-19 MED FILL — FUROSEMIDE 40 MG TAB: 40 | 90 days supply | Qty: 90 | Fill #1

## 2018-11-19 NOTE — Telephone Encounter (Signed)
Mr. Glenn JacobsenFountain sometimes goes over the recommended fluid limit but keeps an eye out for edema. He does not add any salt to his food. He denies sudden weight change/SOB/edema.   Refilled all meds for him with the exception of the [name] which [facility] just received from the mfr.   Mirna MiresAngela Montez Cuda  PharmD Candidate  [employer]  Class of [phone]

## 2018-12-17 ENCOUNTER — Telehealth (HOSPITAL_COMMUNITY): Payer: Self-pay

## 2018-12-17 MED FILL — CARVEDILOL 12.5 MG TABLET: 12.5 | 34 days supply | Qty: 34 | Fill #3

## 2018-12-17 MED FILL — ATORVASTATIN 40 MG TABLET: 40 | 30 days supply | Qty: 30 | Fill #2

## 2018-12-17 MED FILL — SPIRONOLACTONE 50 MG TABS: 50 | 34 days supply | Qty: 34 | Fill #1

## 2018-12-17 MED FILL — OMEPRAZOLE 20 MG CPDR: 20 | 30 days supply | Qty: 30 | Fill #11

## 2018-12-17 MED FILL — DIGOXIN 0.125 MG TABLET: 125 | 34 days supply | Qty: 34 | Fill #1

## 2018-12-17 NOTE — Telephone Encounter (Signed)
Mr. Leota JacobsenFountain denies SOB/changes in wt/edema. He reported a little chest pain the other day but subsided after taking omeprazole and tums, so he knew it was just indigestion.  He reported being low on all medications, refilled all for him.   Mirna MiresAngela Delrose Rohwer  PharmD Candidate  [employer]  Class of [phone]

## 2019-01-19 ENCOUNTER — Other Ambulatory Visit: Payer: Self-pay | Admitting: Family Medicine

## 2019-01-19 DIAGNOSIS — I2510 Atherosclerotic heart disease of native coronary artery without angina pectoris: Secondary | ICD-10-CM

## 2019-01-25 ENCOUNTER — Other Ambulatory Visit (HOSPITAL_COMMUNITY): Payer: Self-pay | Admitting: Cardiology

## 2019-01-25 ENCOUNTER — Other Ambulatory Visit (HOSPITAL_COMMUNITY): Payer: Self-pay | Admitting: Internal Medicine

## 2019-01-25 MED FILL — DIGOXIN 0.125 MG TABLET: 125 | 34 days supply | Qty: 34 | Fill #2

## 2019-01-25 MED FILL — SPIRONOLACTONE 50 MG TABS: 50 | 34 days supply | Qty: 34 | Fill #2

## 2019-01-25 MED FILL — CARVEDILOL 12.5 MG TABLET: 12.5 | 30 days supply | Qty: 30 | Fill #0

## 2019-01-25 NOTE — Telephone Encounter (Signed)
Pt needs follow up appointment for future refills

## 2019-01-26 ENCOUNTER — Other Ambulatory Visit (HOSPITAL_COMMUNITY): Payer: Self-pay

## 2019-01-26 DIAGNOSIS — I509 Heart failure, unspecified: Secondary | ICD-10-CM

## 2019-01-26 MED ORDER — CARVEDILOL 12.5 MG PO TABS: ORAL_TABLET | ORAL | 2 refills | Status: DC

## 2019-01-27 ENCOUNTER — Other Ambulatory Visit (HOSPITAL_COMMUNITY): Payer: Self-pay | Admitting: Cardiology

## 2019-01-27 MED FILL — OMEPRAZOLE 20 MG CPDR: 20 | 30 days supply | Qty: 30 | Fill #0

## 2019-01-28 MED FILL — ATORVASTATIN 40 MG TABLET: 40 | 30 days supply | Qty: 30 | Fill #3

## 2019-02-03 ENCOUNTER — Telehealth (HOSPITAL_COMMUNITY): Payer: Self-pay | Admitting: Licensed Clinical Social Worker

## 2019-02-03 NOTE — Telephone Encounter (Signed)
Clinic received notification that pt [employer] is expiring soon and it is time for renewal.  CSW called pt an notified- pt would like application left at front desk for his completion.  CSW left application for pt with reception  CSW will continue to follow  Burna SisJenna H. Loralye Loberg, LCSW Clinical Social Worker [facility] [phone]

## 2019-02-18 ENCOUNTER — Other Ambulatory Visit (HOSPITAL_COMMUNITY): Payer: Self-pay

## 2019-02-18 DIAGNOSIS — I509 Heart failure, unspecified: Secondary | ICD-10-CM

## 2019-02-18 MED ORDER — CARVEDILOL 12.5 MG PO TABS: ORAL_TABLET | ORAL | 0 refills | Status: DC

## 2019-02-18 MED FILL — CARVEDILOL 12.5 MG TABLET: 12.5 | 30 days supply | Qty: 30 | Fill #0

## 2019-02-18 MED FILL — ATORVASTATIN 40 MG TABLET: 40 | 30 days supply | Qty: 30 | Fill #3 | Status: TO

## 2019-02-25 ENCOUNTER — Other Ambulatory Visit (HOSPITAL_COMMUNITY): Payer: Self-pay

## 2019-03-01 ENCOUNTER — Telehealth (HOSPITAL_COMMUNITY): Payer: Self-pay | Admitting: Vascular Surgery

## 2019-03-01 ENCOUNTER — Other Ambulatory Visit (HOSPITAL_COMMUNITY): Payer: Self-pay

## 2019-03-01 MED ORDER — OMEPRAZOLE 20 MG PO CPDR: 20.0000 mg | DELAYED_RELEASE_CAPSULE | Freq: Every day | ORAL | 2 refills | Status: DC

## 2019-03-01 MED FILL — OMEPRAZOLE 20 MG CPDR: 20 | 30 days supply | Qty: 30 | Fill #0 | Status: TO

## 2019-03-01 NOTE — Telephone Encounter (Signed)
Pt called OP pharmacy to refill Omeprazole last week they have not gotten a response from this office per Pt.. please advise

## 2019-03-01 NOTE — Telephone Encounter (Signed)
Rx has been sent to pharmacy

## 2019-03-02 ENCOUNTER — Other Ambulatory Visit (HOSPITAL_COMMUNITY): Payer: Self-pay

## 2019-03-02 ENCOUNTER — Telehealth (HOSPITAL_COMMUNITY): Payer: Self-pay | Admitting: Licensed Clinical Social Worker

## 2019-03-02 MED ORDER — SACUBITRIL-VALSARTAN 97-103 MG PO TABS: 1.0000 | ORAL_TABLET | Freq: Two times a day (BID) | ORAL | 3 refills | Status: DC

## 2019-03-02 NOTE — Telephone Encounter (Signed)
CSW faxed completed [employer] application in for review- fax confirmation received  CSW will continue to follow and assist as needed  Burna Sis, LCSW Clinical Social Worker [facility] [phone]

## 2019-03-04 ENCOUNTER — Other Ambulatory Visit: Payer: Self-pay | Admitting: Family Medicine

## 2019-03-04 DIAGNOSIS — I2510 Atherosclerotic heart disease of native coronary artery without angina pectoris: Secondary | ICD-10-CM

## 2019-03-08 ENCOUNTER — Other Ambulatory Visit (HOSPITAL_COMMUNITY): Payer: Self-pay

## 2019-03-08 MED ORDER — SPIRONOLACTONE 50 MG PO TABS: 50.0000 mg | ORAL_TABLET | Freq: Every day | ORAL | 2 refills | Status: DC

## 2019-03-08 MED FILL — DIGOXIN 0.125 MG TABLET: 125 | 34 days supply | Qty: 34 | Fill #0

## 2019-03-08 MED FILL — SPIRONOLACTONE 50 MG TABLET: 50 | 34 days supply | Qty: 34 | Fill #0 | Status: TO

## 2019-03-09 ENCOUNTER — Other Ambulatory Visit: Payer: Self-pay | Admitting: Family Medicine

## 2019-03-09 DIAGNOSIS — I2510 Atherosclerotic heart disease of native coronary artery without angina pectoris: Secondary | ICD-10-CM

## 2019-03-11 ENCOUNTER — Telehealth: Payer: Self-pay | Admitting: Family Medicine

## 2019-03-11 NOTE — Telephone Encounter (Signed)
This patient hasn't been seen in over one year. We cannot refill his medication until he has been seen.

## 2019-03-11 NOTE — Telephone Encounter (Signed)
1) Medication(s) Requested (by name):glipiZIDE (GLUCOTROL) 10 MG tablet    2) Pharmacy of Choice:  3) Special Requests:   Approved medications will be sent to the pharmacy, we will reach out if there is an issue.  Requests made after 3pm may not be addressed until the following business day!  If a patient is unsure of the name of the medication(s) please note and ask patient to call back when they are able to provide all info, do not send to responsible party until all information is available!

## 2019-03-16 ENCOUNTER — Encounter: Payer: Self-pay | Admitting: Family Medicine

## 2019-03-16 ENCOUNTER — Ambulatory Visit: Payer: Self-pay | Attending: Family Medicine | Admitting: Family Medicine

## 2019-03-16 ENCOUNTER — Other Ambulatory Visit: Payer: Self-pay

## 2019-03-16 VITALS — BP 120/87 | HR 83 | Temp 97.5°F | Ht 78.0 in | Wt 288.8 lb

## 2019-03-16 DIAGNOSIS — E1169 Type 2 diabetes mellitus with other specified complication: Secondary | ICD-10-CM

## 2019-03-16 DIAGNOSIS — Z6833 Body mass index (BMI) 33.0-33.9, adult: Secondary | ICD-10-CM

## 2019-03-16 DIAGNOSIS — E669 Obesity, unspecified: Secondary | ICD-10-CM

## 2019-03-16 DIAGNOSIS — E1165 Type 2 diabetes mellitus with hyperglycemia: Secondary | ICD-10-CM

## 2019-03-16 DIAGNOSIS — I2510 Atherosclerotic heart disease of native coronary artery without angina pectoris: Secondary | ICD-10-CM

## 2019-03-16 DIAGNOSIS — Z9119 Patient's noncompliance with other medical treatment and regimen: Secondary | ICD-10-CM

## 2019-03-16 DIAGNOSIS — Z794 Long term (current) use of insulin: Secondary | ICD-10-CM

## 2019-03-16 DIAGNOSIS — I5022 Chronic systolic (congestive) heart failure: Secondary | ICD-10-CM

## 2019-03-16 DIAGNOSIS — I10 Essential (primary) hypertension: Secondary | ICD-10-CM

## 2019-03-16 LAB — POCT GLYCOSYLATED HEMOGLOBIN (HGB A1C): HbA1c, POC (controlled diabetic range): 13.4 % — AB (ref 0.0–7.0)

## 2019-03-16 LAB — GLUCOSE, POCT (MANUAL RESULT ENTRY)
POC GLUCOSE: 414 mg/dL — AB (ref 70–99)
POC Glucose: 405 mg/dl — AB (ref 70–99)

## 2019-03-16 MED ORDER — GLIPIZIDE 10 MG PO TABS: ORAL_TABLET | ORAL | 6 refills | Status: DC

## 2019-03-16 MED ORDER — INSULIN GLARGINE 100 UNIT/ML SOLOSTAR PEN: 15.0000 [IU] | PEN_INJECTOR | Freq: Every day | SUBCUTANEOUS | 6 refills | Status: DC

## 2019-03-16 MED ORDER — GLUCOSE BLOOD VI STRP: 1.0000 | ORAL_STRIP | Freq: Three times a day (TID) | 12 refills | Status: DC

## 2019-03-16 MED ORDER — INSULIN PEN NEEDLE 31G X 5 MM MISC: 1.0000 | Freq: Every day | 5 refills | Status: DC

## 2019-03-16 MED ORDER — TRUE METRIX METER DEVI: 1.0000 | Freq: Three times a day (TID) | 0 refills | Status: DC

## 2019-03-16 MED ORDER — TRUEPLUS LANCETS 28G MISC: 1.0000 | Freq: Three times a day (TID) | 11 refills | Status: DC

## 2019-03-16 MED ORDER — INSULIN ASPART 100 UNIT/ML ~~LOC~~ SOLN
15.0000 [IU] | Freq: Once | SUBCUTANEOUS | Status: AC
  Administered 2019-03-16: 15 [IU] via SUBCUTANEOUS

## 2019-03-16 MED FILL — TRUEPLUS PEN NDL 31GX3/16": 31G X 5 MM | 25 days supply | Qty: 100 | Fill #0

## 2019-03-16 MED FILL — glipiZIDE 10 MG TABS: 10 | 30 days supply | Qty: 60 | Fill #0

## 2019-03-16 MED FILL — !LANTUS SOLOSTAR 100UNITS/M: 100 | 20 days supply | Qty: 3 | Fill #0

## 2019-03-16 MED FILL — !TRUE METRIX BLOOD GLUCOSE: 365 days supply | Qty: 1 | Fill #0

## 2019-03-16 MED FILL — TRUE METRIX TEST STRIP: 30 days supply | Qty: 100 | Fill #0

## 2019-03-16 MED FILL — TRUEplus LANCETS 28G MISC: 30 days supply | Qty: 100 | Fill #0

## 2019-03-16 MED FILL — TRUEPLUS PEN NDL 31GX3/16: 31G X 5 MM | 25 days supply | Qty: 100 | Fill #0

## 2019-03-16 NOTE — Progress Notes (Signed)
Subjective:  Patient ID: [name], male    DOB: [birth year]  Age: 49 y.o. MRN: [id number]  CC: Diabetes   HPI [name] is a 48 year old male with a history of type 2 diabetes mellitus (A1c 13.4), hypertension, chronic heart failure with reduced ejection fraction (EF 35 to 40% from 03/2017) , noncompliance here for follow-up visit. He has been out of his glipizide for the last 3 weeks and has not been compliant with a diabetic diet.  Denies visual concerns, numbness in extremities, hypoglycemia.  Not up-to-date on annual eye exam. His blood sugar is 405 today and 15 units of NovoLog has been administered.  Wilder Glade was prescribed at his last office visit however he never commenced this due to complaints of side effects.  In the past he had declined initiation of injectables for management of his diabetes mellitus. He denies shortness of breath, pedal edema, chest pains and last saw cardiology in 06/2018.  He has a good exercise tolerance and denies paroxysmal nocturnal dyspnea. He has no additional concerns today.  Past Medical History:  Diagnosis Date   CAD (coronary artery disease)    NSTEMI 10/2011: LHC 11/04/11: pLAD 90%, mLAD 60-70%, small D2 sub totally occluded at ostium, small OM1 90% ostial, 90% mid, mOM2 30%, oPL 80%, RCA 30%, dPDA 80%, EF 20% with ant AK.  PCI:  Promus DES to pLAD.   Chronic systolic heart failure (HCC)    DM2 (diabetes mellitus, type 2) (HCC)    HTN (hypertension)    Hyperlipidemia    Ischemic cardiomyopathy    Echo 11/03/11: mod LVH, mild focal basal septal hypertrophy, EF 15%, grade 2 diast dysfxn, mild MR, mild to mod LAE, mild RVE, mild to mod reduced RVSF.  EF 3/5 50% by echo   Obesity     Past Surgical History:  Procedure Laterality Date   CARDIAC CATHETERIZATION N/A 10/25/2016   Procedure: Right/Left Heart Cath and Coronary Angiography;  Surgeon: Leonie Man, MD;  Location: [facility];  Service: Cardiovascular;   Laterality: N/A;   CARDIAC CATHETERIZATION N/A 10/25/2016   Procedure: Coronary Stent Intervention;  Surgeon: Leonie Man, MD;  Location: [facility];  Service: Cardiovascular;  Laterality: N/A;   CARDIAC CATHETERIZATION N/A 10/28/2016   Procedure: Coronary Stent Intervention;  Surgeon: Burnell Blanks, MD;  Location: [facility];  Service: Cardiovascular;  Laterality: N/A;   LEFT AND RIGHT HEART CATHETERIZATION WITH CORONARY ANGIOGRAM N/A 11/04/2011   Procedure: LEFT AND RIGHT HEART CATHETERIZATION WITH CORONARY ANGIOGRAM;  Surgeon: Minus Breeding, MD;  Location: [facility];  Service: Cardiovascular;  Laterality: N/A;   NONE     PERCUTANEOUS CORONARY STENT INTERVENTION (PCI-S)  11/04/2011   Procedure: PERCUTANEOUS CORONARY STENT INTERVENTION (PCI-S);  Surgeon: Sherren Mocha, MD;  Location: [facility] CATH LAB;  Service: Cardiovascular;;    Family History  Problem Relation Age of Onset   Heart disease Mother        MOTHER HAD CABG    No Known Allergies  Outpatient Medications Prior to Visit  Medication Sig Dispense Refill   aspirin 81 MG chewable tablet Chew 1 tablet (81 mg total) by mouth daily. 30 tablet 6   atorvastatin (LIPITOR) 40 MG tablet TAKE 1 TABLET BY MOUTH ONCE DAILY 30 tablet 5   carvedilol (COREG) 12.5 MG tablet TAKE 1/2 TABLET BY MOUTH 2 TIMES DAILY. NEED FOLLOW UP APPT [phone] 30 tablet 0   dapagliflozin propanediol (FARXIGA) 10 MG TABS tablet Take 10 mg  by mouth daily. 30 tablet 3   digoxin (LANOXIN) 0.125 MG tablet Take 1 tablet (125 mcg total) by mouth daily. 34 tablet 2   furosemide (LASIX) 40 MG tablet TAKE 1 TABLET BY MOUTH ONCE DAILY 90 tablet 1   omeprazole (PRILOSEC) 20 MG capsule Take 1 capsule (20 mg total) by mouth daily. 30 capsule 2   sacubitril-valsartan (ENTRESTO) 97-103 MG Take 1 tablet by mouth 2 (two) times daily. 180 tablet 3   sildenafil (REVATIO) 20 MG tablet Take 1 tablet (20 mg total) by mouth as needed (for  erectile dysfunction). 30 tablet 0   spironolactone (ALDACTONE) 50 MG tablet Take 1 tablet (50 mg total) by mouth daily. NEEDS APPT FOR FUTURE REFILLS [phone] 34 tablet 2   testosterone cypionate (DEPO-TESTOSTERONE) 200 MG/ML injection Inject 0.5 mLs (100 mg total) into the muscle every 28 (twenty-eight) days. 10 mL 3   glipiZIDE (GLUCOTROL) 10 MG tablet TAKE 1 TABLET BY MOUTH TWICE DAILY BEFORE MEAL(S). MUST MAKE APPT FOR FURTHER REFILLS 60 tablet 0   No facility-administered medications prior to visit.      ROS Review of Systems  Constitutional: Negative for activity change and appetite change.  HENT: Negative for sinus pressure and sore throat.   Eyes: Negative for visual disturbance.  Respiratory: Negative for cough, chest tightness and shortness of breath.   Cardiovascular: Negative for chest pain and leg swelling.  Gastrointestinal: Negative for abdominal distention, abdominal pain, constipation and diarrhea.  Endocrine: Negative.   Genitourinary: Negative for dysuria.  Musculoskeletal: Negative for joint swelling and myalgias.  Skin: Negative for rash.  Allergic/Immunologic: Negative.   Neurological: Negative for weakness, light-headedness and numbness.  Psychiatric/Behavioral: Negative for dysphoric mood and suicidal ideas.    Objective:  BP 120/87    Pulse 83    Temp (!) 97.5 F (36.4 C) (Oral)    Ht '6\' 6"'$  (1.981 m)    Wt 288 lb 12.8 oz (131 kg)    SpO2 99%    BMI 33.37 kg/m   BP/Weight 03/16/2019 02/29/3556 02/29/2024  Systolic BP 427 062 376  Diastolic BP 87 88 89  Wt. (Lbs) 288.8 283.6 293.8  BMI 33.37 32.77 33.95      Physical Exam Constitutional:      Appearance: He is well-developed.  Cardiovascular:     Rate and Rhythm: Normal rate.     Heart sounds: Normal heart sounds. No murmur.  Pulmonary:     Effort: Pulmonary effort is normal.     Breath sounds: Normal breath sounds. No wheezing or rales.  Chest:     Chest wall: No tenderness.  Abdominal:      General: Bowel sounds are normal. There is no distension.     Palpations: Abdomen is soft. There is no mass.     Tenderness: There is no abdominal tenderness.  Musculoskeletal: Normal range of motion.  Neurological:     Mental Status: He is alert and oriented to person, place, and time.  Psychiatric:        Mood and Affect: Mood normal.        Behavior: Behavior normal.     CMP Latest Ref Rng & Units 07/06/2018 03/20/2018 10/20/2017  Glucose 70 - 99 mg/dL 246(H) 267(H) 303(H)  BUN 6 - 20 mg/dL 26(H) 24 27(H)  Creatinine 0.61 - 1.24 mg/dL 1.50(H) 1.28(H) 1.29(H)  Sodium 135 - 145 mmol/L 139 139 134(L)  Potassium 3.5 - 5.1 mmol/L 4.1 4.5 4.0  Chloride 98 - 111 mmol/L 104 99 98(L)  CO2 22 - 32 mmol/L '27 22 27  '$ Calcium 8.9 - 10.3 mg/dL 9.5 9.5 9.6  Total Protein 6.0 - 8.5 g/dL - 7.0 -  Total Bilirubin 0.0 - 1.2 mg/dL - 0.5 -  Alkaline Phos 39 - 117 IU/L - 53 -  AST 0 - 40 IU/L - 16 -  ALT 0 - 44 IU/L - 23 -    Lipid Panel     Component Value Date/Time   CHOL 160 03/20/2018 0829   TRIG 161 (H) 03/20/2018 0829   HDL 34 (L) 03/20/2018 0829   CHOLHDL 4.7 03/20/2018 0829   CHOLHDL 5.0 10/28/2016 0800   VLDL 27 10/28/2016 0800   LDLCALC 94 03/20/2018 0829   LDLDIRECT 117.0 06/14/2013 0826    CBC    Component Value Date/Time   WBC 6.3 03/20/2018 0829   WBC 9.1 11/29/2016 1431   RBC 5.40 03/20/2018 0829   RBC 5.47 11/29/2016 1431   HGB 14.9 03/20/2018 0829   HCT 45.0 03/20/2018 0829   PLT 203 03/20/2018 0829   MCV 83 03/20/2018 0829   MCH 27.6 03/20/2018 0829   MCH 26.3 (L) 11/29/2016 1431   MCHC 33.1 03/20/2018 0829   MCHC 33.0 11/29/2016 1431   RDW 13.6 03/20/2018 0829   LYMPHSABS 2.0 03/20/2018 0829   MONOABS 546 11/29/2016 1431   EOSABS 0.2 03/20/2018 0829   BASOSABS 0.0 03/20/2018 0829    Lab Results  Component Value Date   HGBA1C 13.4 (A) 03/16/2019    Assessment & Plan:   1. Diabetes mellitus type 2 in obese ([place]) Uncontrolled with A1c of  13.4 Initiate Lantus Clinical pharmacist called in to provide education on insulin administration Counseled on Diabetic diet, my plate method, 161 minutes of moderate intensity exercise/week Keep blood sugar logs with fasting goals of 80-120 mg/dl, random of less than 180 and in the event of sugars less than 60 mg/dl or greater than 400 mg/dl please notify the clinic ASAP. It is recommended that you undergo annual eye exams and annual foot exams. Pneumonia vaccine is recommended. - POCT glucose (manual entry) - POCT glycosylated hemoglobin (Hb A1C) - Microalbumin/Creatinine Ratio, Urine; Future - Lipid panel; Future - CMP14+EGFR; Future - Ambulatory referral to Podiatry - insulin aspart (novoLOG) injection 15 Units - POCT glucose (manual entry) - Insulin Glargine (LANTUS SOLOSTAR) 100 UNIT/ML Solostar Pen; Inject 15 Units into the skin daily.  Dispense: 5 pen; Refill: 6 - Insulin Pen Needle 31G X 5 MM MISC; 1 each by Does not apply route at bedtime.  Dispense: 30 each; Refill: 5 - TRUEplus Lancets 28G MISC; 1 each by Does not apply route 3 (three) times daily.  Dispense: 100 each; Refill: 11 - Blood Glucose Monitoring Suppl (TRUE METRIX METER) DEVI; 1 each by Does not apply route 3 (three) times daily.  Dispense: 1 Device; Refill: 0 - glucose blood (TRUE METRIX BLOOD GLUCOSE TEST) test strip; 1 each by Other route 3 (three) times daily.  Dispense: 100 each; Refill: 12  2. Coronary artery disease involving native coronary artery of native heart without angina pectoris No angina at this time Risk factor modification - glipiZIDE (GLUCOTROL) 10 MG tablet; TAKE 1 TABLET BY MOUTH TWICE DAILY BEFORE MEAL(S).  Dispense: 60 tablet; Refill: 6  3. Chronic systolic heart failure (HCC) EF 35 to 40% from 03/2017 Euvolemic Continue Entresto, Lasix, isosorbide  4. Atherosclerosis of native coronary artery of native heart, angina presence unspecified See #2 above  5. Essential hypertension,  benign Controlled Counseled on  blood pressure goal of less than 130/80, low-sodium, DASH diet, medication compliance, 150 minutes of moderate intensity exercise per week. Discussed medication compliance, adverse effects.   Meds ordered this encounter  Medications   DISCONTD: Insulin Glargine (LANTUS SOLOSTAR) 100 UNIT/ML Solostar Pen    Sig: Inject 15 Units into the skin daily.    Dispense:  5 pen    Refill:  6   DISCONTD: glipiZIDE (GLUCOTROL) 10 MG tablet    Sig: TAKE 1 TABLET BY MOUTH TWICE DAILY BEFORE MEAL(S).    Dispense:  60 tablet    Refill:  6   DISCONTD: Insulin Pen Needle 31G X 5 MM MISC    Sig: 1 each by Does not apply route at bedtime.    Dispense:  30 each    Refill:  5   insulin aspart (novoLOG) injection 15 Units   glipiZIDE (GLUCOTROL) 10 MG tablet    Sig: TAKE 1 TABLET BY MOUTH TWICE DAILY BEFORE MEAL(S).    Dispense:  60 tablet    Refill:  6   Insulin Glargine (LANTUS SOLOSTAR) 100 UNIT/ML Solostar Pen    Sig: Inject 15 Units into the skin daily.    Dispense:  5 pen    Refill:  6   Insulin Pen Needle 31G X 5 MM MISC    Sig: 1 each by Does not apply route at bedtime.    Dispense:  30 each    Refill:  5   TRUEplus Lancets 28G MISC    Sig: 1 each by Does not apply route 3 (three) times daily.    Dispense:  100 each    Refill:  11   Blood Glucose Monitoring Suppl (TRUE METRIX METER) DEVI    Sig: 1 each by Does not apply route 3 (three) times daily.    Dispense:  1 Device    Refill:  0   glucose blood (TRUE METRIX BLOOD GLUCOSE TEST) test strip    Sig: 1 each by Other route 3 (three) times daily.    Dispense:  100 each    Refill:  12    Follow-up: Return in about 1 month (around 04/16/2019) for With Lurena Joiner; 3 months with PCP for follow-up of chronic medical conditions.       Charlott Rakes, MD, FAAFP. [name] [facility] [place], [place]   03/16/2019, 11:02 AM

## 2019-03-16 NOTE — Progress Notes (Signed)
Patient has been out of Glipizide for 3 weeks.

## 2019-03-17 ENCOUNTER — Telehealth (HOSPITAL_COMMUNITY): Payer: Self-pay | Admitting: Licensed Clinical Social Worker

## 2019-03-17 NOTE — Telephone Encounter (Signed)
Income verification sent to to [employer] to complete application- application still pending  CSW will continue to follow and assist as needed  Burna Sis, LCSW Clinical Social Worker [facility] [phone]

## 2019-03-25 ENCOUNTER — Telehealth (HOSPITAL_COMMUNITY): Payer: Self-pay | Admitting: Licensed Clinical Social Worker

## 2019-03-25 NOTE — Telephone Encounter (Signed)
Notification received that pt has been approved to get Entresto through [employer] program.  Pt ID#: [id number] Expiration: 03/23/2020  CSW called pt and left message to inform- will continue to follow and assist as needed  Burna Sis, LCSW Clinical Social Worker [facility] Desk#: [phone] Cell#: [phone]

## 2019-03-26 ENCOUNTER — Other Ambulatory Visit (HOSPITAL_COMMUNITY): Payer: Self-pay | Admitting: Internal Medicine

## 2019-03-26 ENCOUNTER — Other Ambulatory Visit (HOSPITAL_COMMUNITY): Payer: Self-pay | Admitting: Cardiology

## 2019-03-26 DIAGNOSIS — I509 Heart failure, unspecified: Secondary | ICD-10-CM

## 2019-03-26 MED FILL — ATORVASTATIN 40 MG TABLET: 40 | 30 days supply | Qty: 30 | Fill #0

## 2019-03-26 MED FILL — !LANTUS SOLOSTAR 100UNITS/M: 100 | 20 days supply | Qty: 3 | Fill #1

## 2019-03-26 MED FILL — OMEPRAZOLE 20 MG CPDR: 20 | 30 days supply | Qty: 30 | Fill #0

## 2019-03-29 MED FILL — FUROSEMIDE 40 MG TAB: 40 | 30 days supply | Qty: 30 | Fill #0

## 2019-03-29 MED FILL — CARVEDILOL 12.5 MG TABLET: 12.5 | 30 days supply | Qty: 30 | Fill #0

## 2019-04-12 ENCOUNTER — Other Ambulatory Visit (HOSPITAL_COMMUNITY): Payer: Self-pay | Admitting: Internal Medicine

## 2019-04-12 MED FILL — SPIRONOLACTONE 50 MG TABS: 50 | 34 days supply | Qty: 34 | Fill #0

## 2019-04-12 MED FILL — DIGOXIN 0.125 MG TABLET: 125 | 90 days supply | Qty: 90 | Fill #0

## 2019-04-19 MED FILL — glipiZIDE 10 MG TABS: 10 | 30 days supply | Qty: 60 | Fill #1

## 2019-04-22 MED FILL — FUROSEMIDE 40 MG TAB: 40 | 30 days supply | Qty: 30 | Fill #1

## 2019-04-22 MED FILL — CARVEDILOL 12.5 MG TABLET: 12.5 | 30 days supply | Qty: 30 | Fill #1

## 2019-04-22 MED FILL — ATORVASTATIN 40 MG TABLET: 40 | 30 days supply | Qty: 30 | Fill #1

## 2019-04-23 MED FILL — OMEPRAZOLE 20 MG CPDR: 20 | 30 days supply | Qty: 30 | Fill #1

## 2019-04-26 MED FILL — !LANTUS SOLOSTAR 100UNITS/M: 100 | 40 days supply | Qty: 6 | Fill #2

## 2019-05-14 ENCOUNTER — Encounter (HOSPITAL_COMMUNITY): Payer: Self-pay | Admitting: Cardiology

## 2019-05-14 ENCOUNTER — Other Ambulatory Visit (HOSPITAL_COMMUNITY): Payer: Self-pay

## 2019-05-19 ENCOUNTER — Other Ambulatory Visit (HOSPITAL_COMMUNITY): Payer: Self-pay | Admitting: Cardiology

## 2019-05-19 MED FILL — FUROSEMIDE 40 MG TAB: 40 | 30 days supply | Qty: 30 | Fill #2

## 2019-05-19 MED FILL — OMEPRAZOLE 20 MG CPDR: 20 | 30 days supply | Qty: 30 | Fill #0

## 2019-05-19 MED FILL — CARVEDILOL 12.5 MG TABLET: 12.5 | 30 days supply | Qty: 30 | Fill #2

## 2019-05-19 MED FILL — SPIRONOLACTONE 50 MG TABS: 50 | 34 days supply | Qty: 34 | Fill #1

## 2019-05-20 MED FILL — glipiZIDE 10 MG TABS: 10 | 30 days supply | Qty: 60 | Fill #2

## 2019-06-09 ENCOUNTER — Other Ambulatory Visit (HOSPITAL_COMMUNITY): Payer: Self-pay | Admitting: Cardiology

## 2019-06-09 MED ORDER — ATORVASTATIN CALCIUM 40 MG PO TABS: 40.0000 mg | ORAL_TABLET | Freq: Every day | ORAL | 6 refills | Status: DC

## 2019-06-09 MED FILL — $LANTUS SOLOSTAR 100 UNITS/: 100 | 40 days supply | Qty: 6 | Fill #3

## 2019-06-09 NOTE — Addendum Note (Signed)
Addended by: Valeda Malm on: 06/09/2019 12:20 PM   Modules accepted: Orders

## 2019-06-21 ENCOUNTER — Other Ambulatory Visit (HOSPITAL_COMMUNITY): Payer: Self-pay | Admitting: Internal Medicine

## 2019-06-21 ENCOUNTER — Other Ambulatory Visit (HOSPITAL_COMMUNITY): Payer: Self-pay | Admitting: Cardiology

## 2019-06-21 DIAGNOSIS — I509 Heart failure, unspecified: Secondary | ICD-10-CM

## 2019-06-24 ENCOUNTER — Other Ambulatory Visit (HOSPITAL_COMMUNITY): Payer: Self-pay | Admitting: Cardiology

## 2019-06-24 MED FILL — glipiZIDE 10 MG TABS: 10 | 90 days supply | Qty: 180 | Fill #3

## 2019-06-29 ENCOUNTER — Other Ambulatory Visit (HOSPITAL_COMMUNITY): Payer: Self-pay

## 2019-06-29 DIAGNOSIS — I509 Heart failure, unspecified: Secondary | ICD-10-CM

## 2019-06-29 MED ORDER — SPIRONOLACTONE 50 MG PO TABS: 50.0000 mg | ORAL_TABLET | Freq: Every day | ORAL | 0 refills | Status: DC

## 2019-06-29 MED ORDER — CARVEDILOL 12.5 MG PO TABS: ORAL_TABLET | ORAL | 0 refills | Status: DC

## 2019-06-29 MED ORDER — DIGOXIN 125 MCG PO TABS: 125.0000 ug | ORAL_TABLET | Freq: Every day | ORAL | 0 refills | Status: DC

## 2019-06-29 MED ORDER — ATORVASTATIN CALCIUM 40 MG PO TABS: 40.0000 mg | ORAL_TABLET | Freq: Every day | ORAL | 6 refills | Status: DC

## 2019-06-29 MED ORDER — FUROSEMIDE 40 MG PO TABS: 40.0000 mg | ORAL_TABLET | Freq: Every day | ORAL | 1 refills | Status: DC

## 2019-06-29 MED FILL — ATORVASTATIN 40 MG TABLET: 40 | 30 days supply | Qty: 30 | Fill #0

## 2019-06-29 MED FILL — FUROSEMIDE 40 MG TAB: 40 | 30 days supply | Qty: 30 | Fill #0

## 2019-06-29 MED FILL — CARVEDILOL 12.5 MG TABLET: 12.5 | 30 days supply | Qty: 30 | Fill #0

## 2019-06-29 MED FILL — SPIRONOLACTONE 50 MG TABLET: 50 | 30 days supply | Qty: 30 | Fill #0

## 2019-06-29 MED FILL — DIGOXIN 0.125 MG TABLET: 125 | 30 days supply | Qty: 30 | Fill #0

## 2019-07-02 MED FILL — OMEPRAZOLE 20 MG CPDR: 20 | 30 days supply | Qty: 30 | Fill #0

## 2019-07-09 ENCOUNTER — Encounter (HOSPITAL_COMMUNITY): Payer: Self-pay | Admitting: Cardiology

## 2019-07-09 ENCOUNTER — Ambulatory Visit (HOSPITAL_COMMUNITY): Payer: Self-pay

## 2019-07-13 ENCOUNTER — Encounter (HOSPITAL_COMMUNITY): Payer: Self-pay | Admitting: Cardiology

## 2019-07-13 ENCOUNTER — Other Ambulatory Visit (HOSPITAL_COMMUNITY): Payer: Self-pay

## 2019-07-26 MED FILL — $LANTUS SOLOSTAR 100 UNITS/: 100 | 60 days supply | Qty: 9 | Fill #4

## 2019-07-29 ENCOUNTER — Other Ambulatory Visit (HOSPITAL_COMMUNITY): Payer: Self-pay | Admitting: Cardiology

## 2019-07-29 ENCOUNTER — Other Ambulatory Visit (HOSPITAL_COMMUNITY): Payer: Self-pay

## 2019-07-29 MED ORDER — SPIRONOLACTONE 50 MG PO TABS: 50.0000 mg | ORAL_TABLET | Freq: Every day | ORAL | 1 refills | Status: DC

## 2019-07-29 MED FILL — OMEPRAZOLE 20 MG CAPSULE DR: 20 | 30 days supply | Qty: 30 | Fill #0

## 2019-07-29 MED FILL — ATORVASTATIN 40 MG TABLET: 40 | 30 days supply | Qty: 30 | Fill #1

## 2019-07-29 MED FILL — CARVEDILOL 12.5 MG TABLET: 12.5 | 30 days supply | Qty: 30 | Fill #0

## 2019-07-29 MED FILL — FUROSEMIDE 40 MG TAB: 40 | 30 days supply | Qty: 30 | Fill #1

## 2019-07-29 MED FILL — DIGOXIN 0.125 MG TABLET: 125 | 30 days supply | Qty: 30 | Fill #1

## 2019-07-29 MED FILL — SPIRONOLACTONE 50 MG TABLET: 50 | 30 days supply | Qty: 30 | Fill #0

## 2019-08-31 ENCOUNTER — Other Ambulatory Visit: Payer: Self-pay

## 2019-08-31 ENCOUNTER — Other Ambulatory Visit (HOSPITAL_COMMUNITY): Payer: Self-pay | Admitting: Internal Medicine

## 2019-08-31 ENCOUNTER — Encounter (HOSPITAL_COMMUNITY): Payer: Self-pay | Admitting: Cardiology

## 2019-08-31 ENCOUNTER — Ambulatory Visit (HOSPITAL_BASED_OUTPATIENT_CLINIC_OR_DEPARTMENT_OTHER)
Admission: RE | Admit: 2019-08-31 | Discharge: 2019-08-31 | Disposition: A | Discharge status: Home / Self Care | Payer: Self-pay | Source: Ambulatory Visit | Attending: Cardiology | Admitting: Cardiology

## 2019-08-31 ENCOUNTER — Other Ambulatory Visit (HOSPITAL_COMMUNITY): Payer: Self-pay | Admitting: *Deleted

## 2019-08-31 ENCOUNTER — Ambulatory Visit (HOSPITAL_COMMUNITY)
Admission: RE | Admit: 2019-08-31 | Discharge: 2019-08-31 | Disposition: A | Discharge status: Home / Self Care | Payer: Self-pay | Source: Ambulatory Visit | Attending: Cardiology | Admitting: Cardiology

## 2019-08-31 VITALS — BP 122/98 | HR 83 | Wt 283.8 lb

## 2019-08-31 DIAGNOSIS — I252 Old myocardial infarction: Secondary | ICD-10-CM | POA: Insufficient documentation

## 2019-08-31 DIAGNOSIS — I2510 Atherosclerotic heart disease of native coronary artery without angina pectoris: Secondary | ICD-10-CM

## 2019-08-31 DIAGNOSIS — I509 Heart failure, unspecified: Secondary | ICD-10-CM

## 2019-08-31 DIAGNOSIS — I5022 Chronic systolic (congestive) heart failure: Secondary | ICD-10-CM

## 2019-08-31 DIAGNOSIS — E119 Type 2 diabetes mellitus without complications: Secondary | ICD-10-CM | POA: Insufficient documentation

## 2019-08-31 DIAGNOSIS — I255 Ischemic cardiomyopathy: Secondary | ICD-10-CM

## 2019-08-31 DIAGNOSIS — Z79899 Other long term (current) drug therapy: Secondary | ICD-10-CM | POA: Insufficient documentation

## 2019-08-31 DIAGNOSIS — Z955 Presence of coronary angioplasty implant and graft: Secondary | ICD-10-CM | POA: Insufficient documentation

## 2019-08-31 DIAGNOSIS — E785 Hyperlipidemia, unspecified: Secondary | ICD-10-CM | POA: Insufficient documentation

## 2019-08-31 DIAGNOSIS — Z794 Long term (current) use of insulin: Secondary | ICD-10-CM | POA: Insufficient documentation

## 2019-08-31 DIAGNOSIS — Z8249 Family history of ischemic heart disease and other diseases of the circulatory system: Secondary | ICD-10-CM | POA: Insufficient documentation

## 2019-08-31 DIAGNOSIS — Z7982 Long term (current) use of aspirin: Secondary | ICD-10-CM | POA: Insufficient documentation

## 2019-08-31 DIAGNOSIS — I110 Hypertensive heart disease with heart failure: Secondary | ICD-10-CM | POA: Insufficient documentation

## 2019-08-31 LAB — BASIC METABOLIC PANEL
Anion gap: 11 (ref 5–15)
BUN: 27 mg/dL — ABNORMAL HIGH (ref 6–20)
CO2: 22 mmol/L (ref 22–32)
Calcium: 9.4 mg/dL (ref 8.9–10.3)
Chloride: 103 mmol/L (ref 98–111)
Creatinine, Ser: 1.17 mg/dL (ref 0.61–1.24)
GFR calc Af Amer: >60 mL/min (ref >60)
GFR calc non Af Amer: >60 mL/min (ref >60)
Glucose, Bld: 258 mg/dL — ABNORMAL HIGH (ref 70–99)
Potassium: 4.5 mmol/L (ref 3.5–5.1)
Sodium: 136 mmol/L (ref 135–145)

## 2019-08-31 LAB — LIPID PANEL
Cholesterol: 189 mg/dL (ref 0–200)
HDL: 39 mg/dL — ABNORMAL LOW (ref >40)
LDL Cholesterol: 114 mg/dL — ABNORMAL HIGH (ref 0–99)
Total CHOL/HDL Ratio: 4.8 RATIO
Triglycerides: 180 mg/dL — ABNORMAL HIGH (ref <150)
VLDL: 36 mg/dL (ref 0–40)

## 2019-08-31 LAB — DIGOXIN LEVEL: Digoxin Level: 0.5 ng/mL — ABNORMAL LOW (ref 0.8–2.0)

## 2019-08-31 MED ORDER — SILDENAFIL CITRATE 20 MG PO TABS: 20.0000 mg | ORAL_TABLET | ORAL | 3 refills | Status: DC | PRN

## 2019-08-31 MED ORDER — FARXIGA 10 MG PO TABS: 10.0000 mg | ORAL_TABLET | Freq: Every day | ORAL | 6 refills | Status: DC

## 2019-08-31 MED ORDER — CARVEDILOL 6.25 MG PO TABS: 6.2500 mg | ORAL_TABLET | Freq: Two times a day (BID) | ORAL | 6 refills | Status: DC

## 2019-08-31 MED FILL — SPIRONOLACTONE 50 MG TABLET: 50 | 30 days supply | Qty: 30 | Fill #1

## 2019-08-31 MED FILL — ATORVASTATIN 40 MG TABLET: 40 | 30 days supply | Qty: 30 | Fill #2

## 2019-08-31 MED FILL — SILDENAFIL CITRATE 20 MG TA: 20 | 30 days supply | Qty: 10 | Fill #0

## 2019-08-31 MED FILL — OMEPRAZOLE 20 MG CAPSULE DR: 20 | 30 days supply | Qty: 30 | Fill #1

## 2019-08-31 MED FILL — CARVEDILOL 6.25 MG TABLET: 6.25 | 30 days supply | Qty: 60 | Fill #0

## 2019-08-31 MED FILL — FARXIGA 10 MG TABLET: 10 | 30 days supply | Qty: 30 | Fill #0

## 2019-08-31 MED FILL — DIGOXIN 0.125 MG TABLET: 125 | 30 days supply | Qty: 30 | Fill #2

## 2019-08-31 MED FILL — FUROSEMIDE 40 MG TAB: 40 | 30 days supply | Qty: 30 | Fill #2

## 2019-08-31 NOTE — Progress Notes (Signed)
CSW consulted to meet with pt regarding insurance concerns.  Pt currently without insurance but has been working full time.  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  CSW encouraged him to look into this further as he could likely sign up later in the year.   Pt states he is currently suspended so he is unsure if he will still be employed in the near future.  CSW also discussed open enrollment for [employer] insurance- pt states he looked into this last year but it was cost prohibitive.  CSW explained that policy choices couldve changed and that he should look into this again this year.  CSW also provided pt with [facility] application to fill out and turn in to assist with [facility] bills.  CSW will continue to follow and assist as needed  Jorge Ny, [facility] Desk#: [phone] Cell#: [phone]

## 2019-08-31 NOTE — Patient Instructions (Signed)
Change Carvedilol to 6.25 mg Twice daily   Start Farxiga 10 mg daily  Labs done today  Your physician recommends that you schedule a follow-up appointment in: 2 months  At the [facility], you and your health needs are our priority. As part of our continuing mission to provide you with exceptional heart care, we have created designated Provider Care Teams. These Care Teams include your primary Cardiologist (physician) and Advanced Practice Providers (APPs- Physician Assistants and Nurse Practitioners) who all work together to provide you with the care you need, when you need it.   You may see any of the following providers on your designated Care Team at your next follow up: [name] Dr Glori Bickers . Dr Loralie Champagne . Darrick Grinder, NP   Please be sure to bring in all your medications bottles to every appointment.

## 2019-08-31 NOTE — Progress Notes (Signed)
  Echocardiogram 2D Echocardiogram has been performed.  [name] G [name] 08/31/2019, 10:26 AM

## 2019-09-01 ENCOUNTER — Other Ambulatory Visit (HOSPITAL_COMMUNITY): Payer: Self-pay

## 2019-09-01 ENCOUNTER — Telehealth (HOSPITAL_COMMUNITY): Payer: Self-pay | Admitting: Pharmacy Technician

## 2019-09-01 NOTE — Progress Notes (Signed)
[facility] Note  PCP: Dr. Venetia [name] Cardiology: Dr. Antoine [name] HF Cardiology: Dr. Otelia Sergeant [name] is a 48 y.o. male with history of CAD and ischemic cardiomyopathy.  He initially had NSTEMI in 10/12 with DES to proximal LAD. At that time, EF was noted to be down to 15%.  Over time, EF improved to 50% (3/13 echo).    Echo in 10/17 showed EF back down to 10% with severe functional MR.  He was taken for cardiac cath.  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  He had DES x 2 to LCx/OM2 and had a staged procedure with DES to distal RCA/ostial PDA.  RHC showed elevated filling pressure and low cardiac output.  While in the hospital, he was on milrinone gtt and was diuresed.  Eventually, we were able to titrate him off milrinone and discharged him home on a po regimen.   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   Echo was done today and I reviewed it.  EF appears to be in the 35% range with trivial MR.    Patient is generally doing pretty well.  Weight is down 5 lbs.  No chest pain.  He is fatigued/tired after working a full day.  However, he does not get short of breath with his usual daily activities.  No orthopnea/PND.  +Snoring and daytime sleepiness.    ECG (personally reviewed): NSR, inferior Qs, poor RWP.   Labs (7/19): K 4.1, creatinine 1.5  Review of systems complete and found to be negative unless listed in HPI.   PMH:  1. CAD: NSTEMI 10/12 with DES to proximal LAD.  - LHC (10/25/16): subtotal occlusion LCx/OM2, 80% dRCA/ostial PCA => DES x 2 to LCx/OM2.  On 10/30, staged procedure with DES to dRCA/ostial PDA.   2. Chronic systolic CHF: Ischemic cardiomyopathy.   - 11/12 echo with EF 15%. - Echo (3/13) with EF 50% - Echo (10/16) with EF 35-40% - Echo (10/17) with EF 10%, severe MR (appears functional).  - RHC (10/17) with mean RA 20, PA 73/36, mean PCWP 28, CI 1.5 Fick/1.74 thermo -  Echo (8/20) with EF 35% by my review, trivial MR.  3. Type II diabetes 4. HTN 5. Hyperlipidemia 6. Obesity 7. OSA: Suspected.   Social History   Socioeconomic History  . Marital status: Single    Spouse name: Not on file  . Number of children: Not on file  . Years of education: Not on file  . Highest education level: Not on file  Occupational History  . Not on file  Social Needs  . Financial resource strain: Not on file  . Food insecurity    Worry: Not on file    Inability: Not on file  . Transportation needs    Medical: Not on file    Non-medical: Not on file  Tobacco Use  . Smoking status: Never Smoker  . Smokeless tobacco: Never Used  Substance and Sexual Activity  . Alcohol use: Yes    Alcohol/week: 1.0 standard drinks    Types: 1 Shots of liquor per week    Comment: OCCASIONAL  . Drug use: No  . Sexual activity: Yes  Lifestyle  . Physical activity    Days per week: Not on file    Minutes per session: Not on file  . Stress: Not on file  Relationships  . Social Musician on phone: Not  on file    Gets together: Not on file    Attends religious service: Not on file    Active member of club or organization: Not on file    Attends meetings of clubs or organizations: Not on file    Relationship status: Not on file  . Intimate partner violence    Fear of current or ex partner: Not on file    Emotionally abused: Not on file    Physically abused: Not on file    Forced sexual activity: Not on file  Other Topics Concern  . Not on file  Social History Narrative  . Not on file   Family History  Problem Relation Age of Onset  . Heart disease Mother        MOTHER HAD CABG     Current Outpatient Medications  Medication Sig Dispense Refill  . aspirin 81 MG chewable tablet Chew 1 tablet (81 mg total) by mouth daily. 30 tablet 6  . atorvastatin (LIPITOR) 40 MG tablet Take 1 tablet (40 mg total) by mouth daily. 30 tablet 6  . Blood Glucose Monitoring  Suppl (TRUE METRIX METER) DEVI 1 each by Does not apply route 3 (three) times daily. 1 Device 0  . digoxin (LANOXIN) 0.125 MG tablet Take 1 tablet (125 mcg total) by mouth daily. 90 tablet 0  . furosemide (LASIX) 40 MG tablet Take 1 tablet (40 mg total) by mouth daily. 90 tablet 1  . glipiZIDE (GLUCOTROL) 10 MG tablet TAKE 1 TABLET BY MOUTH TWICE DAILY BEFORE MEAL(S). 60 tablet 6  . glucose blood (TRUE METRIX BLOOD GLUCOSE TEST) test strip 1 each by Other route 3 (three) times daily. 100 each 12  . Insulin Glargine (LANTUS SOLOSTAR) 100 UNIT/ML Solostar Pen Inject 15 Units into the skin daily. 5 pen 6  . Insulin Pen Needle 31G X 5 MM MISC 1 each by Does not apply route at bedtime. 30 each 5  . omeprazole (PRILOSEC) 20 MG capsule TAKE 1 CAPSULE BY MOUTH ONCE A DAY 30 capsule 1  . sacubitril-valsartan (ENTRESTO) 97-103 MG Take 1 tablet by mouth 2 (two) times daily. 180 tablet 3  . sildenafil (REVATIO) 20 MG tablet Take 1 tablet (20 mg total) by mouth as needed (for erectile dysfunction). 10 tablet 3  . spironolactone (ALDACTONE) 50 MG tablet Take 1 tablet (50 mg total) by mouth daily. 30 tablet 1  . TRUEplus Lancets 28G MISC 1 each by Does not apply route 3 (three) times daily. 100 each 11  . carvedilol (COREG) 6.25 MG tablet Take 1 tablet (6.25 mg total) by mouth 2 (two) times daily with a meal. 60 tablet 6  . dapagliflozin propanediol (FARXIGA) 10 MG TABS tablet Take 10 mg by mouth daily before breakfast. 30 tablet 6   No current facility-administered medications for this encounter.    BP (!) 122/98   Pulse 83   Wt 128.7 kg (283 lb 12.8 oz)   SpO2 98%   BMI 32.80 kg/m  Wt Readings from Last 3 Encounters:  08/31/19 128.7 kg (283 lb 12.8 oz)  03/16/19 131 kg (288 lb 12.8 oz)  07/06/18 128.6 kg (283 lb 9.6 oz)   General: NAD, obese Neck: Thick, no JVD, no thyromegaly or thyroid nodule.  Lungs: Clear to auscultation bilaterally with normal respiratory effort. CV: Nondisplaced PMI.  Heart  regular S1/S2, no S3/S4, no murmur.  No peripheral edema.  No carotid bruit.  Normal pedal pulses.  Abdomen: Soft, nontender, no hepatosplenomegaly, no distention.  Skin: Intact without lesions or rashes.  Neurologic: Alert and oriented x 3.  Psych: Normal affect. Extremities: No clubbing or cyanosis.  HEENT: Normal.    Assessment/Plan: 1. CAD: Cath in 10/17 after EF found to have significantly dropped.  He had DES x 2 to the LCx system and DES x 1 to RCA.  No chest pain.  - Continue ASA 81 daily.   - Continue atorvastatin 40 mg daily, check lipids today.  2. Chronic systolic CHF: EF 16%10% on 10/17 echo, down from 35-40% in 10/16.  Ischemic cardiomyopathy.  Now s/p PCI as above.  He had low output HF with volume overload on RHC done in 10/17.  He required milrinone and diuresis while in the hospital.  We were able to titrate off milrinone.  Currently much improved, NYHA class II symptoms with no significant volume overload.  I reviewed the echo done today.  To my read, EF is improved to 35% range.  - Continue Lasix 40 mg daily. BMET today.  - Continue spironolactone 50 mg daily.  - Continue Coreg 6.25 mg bid, he has not tolerated increase.  - Continue Entresto 97/103 bid.  - I will add dapagliflozin 10 mg daily.    - Continue digoxin, check level today.  - EF now borderline for ICD.  Would plan to repeat echo in 6 months with ongoing medical management.   3. Suspect OSA: When he gets insurance, will need sleep study.   4. He still does not have health insurance though he works full time.  I asked out social worker to help with this.  Marca AnconaDalton Evalyne Cortopassi 09/01/2019

## 2019-09-01 NOTE — Progress Notes (Signed)
Pt completed the [employer] pt assistance form at his OV 9/1 and then emailed [place] his income info, all info faxed to [employer] for assistance with [place]

## 2019-09-01 NOTE — Addendum Note (Signed)
Encounter addended by: Scarlette Calico, RN on: 09/01/2019 1:43 PM  Actions taken: Clinical Note Signed

## 2019-09-01 NOTE — Telephone Encounter (Signed)
Received application from [place] that patient needs assistance with [place]. Application sent to [employer].  Fax: [phone]  Phone: [phone]  Will continue to follow.  Charlann Boxer, CPhT

## 2019-09-10 MED FILL — CARVEDILOL 6.25 MG TABLET: 6.25 | 30 days supply | Qty: 60 | Fill #0

## 2019-09-10 MED FILL — SILDENAFIL CITRATE 20 MG TA: 20 | 30 days supply | Qty: 10 | Fill #0

## 2019-09-16 NOTE — Telephone Encounter (Signed)
Patient was approved to receive Farxiga through [employer].  ID: [id number]  Effective Dates: 09/08/2019 through 09/06/2020  Medication was delivered to the patient on September 14.   Charlann Boxer, CPhT

## 2019-09-21 MED FILL — $LANTUS SOLOSTAR 100 UNITS/: 100 | 60 days supply | Qty: 9 | Fill #5

## 2019-09-24 ENCOUNTER — Other Ambulatory Visit (HOSPITAL_COMMUNITY): Payer: Self-pay

## 2019-09-24 ENCOUNTER — Other Ambulatory Visit (HOSPITAL_COMMUNITY): Payer: Self-pay | Admitting: *Deleted

## 2019-09-24 MED ORDER — ATORVASTATIN CALCIUM 80 MG PO TABS: 80.0000 mg | ORAL_TABLET | Freq: Every day | ORAL | 3 refills | Status: DC

## 2019-09-24 MED FILL — ATORVASTATIN 80 MG TABLET: 80 | 30 days supply | Qty: 30 | Fill #0

## 2019-09-29 ENCOUNTER — Other Ambulatory Visit (HOSPITAL_COMMUNITY): Payer: Self-pay

## 2019-09-29 ENCOUNTER — Other Ambulatory Visit (HOSPITAL_COMMUNITY): Payer: Self-pay | Admitting: Cardiology

## 2019-09-29 MED ORDER — DIGOXIN 125 MCG PO TABS: 125.0000 ug | ORAL_TABLET | Freq: Every day | ORAL | 0 refills | Status: DC

## 2019-09-29 MED FILL — SPIRONOLACTONE 50 MG TABLET: 50 | 30 days supply | Qty: 30 | Fill #0

## 2019-09-29 MED FILL — OMEPRAZOLE 20 MG CAPSULE DR: 20 | 30 days supply | Qty: 30 | Fill #0

## 2019-09-29 MED FILL — FUROSEMIDE 40 MG TAB: 40 | 30 days supply | Qty: 30 | Fill #3

## 2019-09-29 MED FILL — DIGOXIN 0.125 MG TABLET: 125 | 30 days supply | Qty: 30 | Fill #0

## 2019-10-14 MED FILL — glipiZIDE 10 MG TABS: 10 | 30 days supply | Qty: 60 | Fill #4

## 2019-10-25 MED FILL — SPIRONOLACTONE 50 MG TABLET: 50 | 30 days supply | Qty: 30 | Fill #1

## 2019-10-25 MED FILL — OMEPRAZOLE 20 MG CAPSULE DR: 20 | 30 days supply | Qty: 30 | Fill #1

## 2019-10-25 MED FILL — CARVEDILOL 6.25 MG TABLET: 6.25 | 30 days supply | Qty: 60 | Fill #1

## 2019-11-01 MED FILL — FUROSEMIDE 40 MG TAB: 40 | 30 days supply | Qty: 30 | Fill #4

## 2019-11-01 MED FILL — ATORVASTATIN 80 MG TABLET: 80 | 30 days supply | Qty: 30 | Fill #1

## 2019-11-01 MED FILL — DIGOXIN 0.125 MG TABLET: 125 | 30 days supply | Qty: 30 | Fill #1

## 2019-11-01 MED FILL — SILDENAFIL CITRATE 20 MG TA: 20 | 30 days supply | Qty: 10 | Fill #1

## 2019-11-04 MED FILL — OMEPRAZOLE 20 MG CAPSULE DR: 20 | 30 days supply | Qty: 30 | Fill #1

## 2019-11-08 MED FILL — SPIRONOLACTONE 50 MG TABLET: 50 | 30 days supply | Qty: 30 | Fill #1

## 2019-11-12 ENCOUNTER — Encounter (HOSPITAL_COMMUNITY): Payer: Self-pay | Admitting: Cardiology

## 2019-11-19 ENCOUNTER — Other Ambulatory Visit: Payer: Self-pay | Admitting: Family Medicine

## 2019-11-19 DIAGNOSIS — I2510 Atherosclerotic heart disease of native coronary artery without angina pectoris: Secondary | ICD-10-CM

## 2019-11-19 DIAGNOSIS — E1169 Type 2 diabetes mellitus with other specified complication: Secondary | ICD-10-CM

## 2019-11-19 MED FILL — TRUEPLUS 5-BEVEL PEN NEEDLE: 31G X 5 MM | 30 days supply | Qty: 100 | Fill #0

## 2019-11-19 MED FILL — $LANTUS SOLOSTAR 100 UNITS/: 100 | 60 days supply | Qty: 9 | Fill #6

## 2019-11-19 MED FILL — glipiZIDE 10 MG TABS: 10 | 30 days supply | Qty: 60 | Fill #0

## 2019-11-29 ENCOUNTER — Other Ambulatory Visit (HOSPITAL_COMMUNITY): Payer: Self-pay | Admitting: Cardiology

## 2019-11-29 MED FILL — SPIRONOLACTONE 50 MG TABLET: 50 | 30 days supply | Qty: 30 | Fill #1

## 2019-11-29 MED FILL — OMEPRAZOLE 20 MG CAPSULE DR: 20 | 30 days supply | Qty: 30 | Fill #0

## 2019-11-29 MED FILL — SILDENAFIL CITRATE 20 MG TA: 20 | 30 days supply | Qty: 10 | Fill #2

## 2019-11-29 MED FILL — ATORVASTATIN 80 MG TABLET: 80 | 30 days supply | Qty: 30 | Fill #2

## 2019-11-29 MED FILL — DIGOXIN 0.125 MG TABLET: 125 | 30 days supply | Qty: 30 | Fill #2

## 2019-11-29 MED FILL — FUROSEMIDE 40 MG TAB: 40 | 30 days supply | Qty: 30 | Fill #5

## 2019-12-19 ENCOUNTER — Ambulatory Visit
Admission: EM | Admit: 2019-12-19 | Discharge: 2019-12-19 | Disposition: A | Discharge status: Home / Self Care | Payer: Self-pay | Attending: Physician Assistant | Admitting: Physician Assistant

## 2019-12-19 ENCOUNTER — Other Ambulatory Visit: Payer: Self-pay

## 2019-12-19 DIAGNOSIS — L97521 Non-pressure chronic ulcer of other part of left foot limited to breakdown of skin: Secondary | ICD-10-CM

## 2019-12-19 MED ORDER — DOXYCYCLINE HYCLATE 100 MG PO CAPS: 100.0000 mg | ORAL_CAPSULE | Freq: Two times a day (BID) | ORAL | 0 refills | Status: DC

## 2019-12-19 MED ORDER — MUPIROCIN 2 % EX OINT: 1.0000 "application " | TOPICAL_OINTMENT | Freq: Two times a day (BID) | CUTANEOUS | 0 refills | Status: DC

## 2019-12-19 NOTE — ED Triage Notes (Signed)
Pt states he has a wound to the bottom of his lt foot x2wks.

## 2019-12-19 NOTE — Discharge Instructions (Signed)
Start doxycycline as directed.  Keep wound clean and dry, you can clean gently with soap and water.  Check wound at least 1-2 times a day.  Monitor for any spreading redness, warmth, drainage, fever, follow-up for reevaluation.  Otherwise, please follow-up with foot doctor/wound center to monitor for healing.

## 2019-12-19 NOTE — ED Provider Notes (Signed)
[facility]    CSN: [id number] Arrival date & time: 12/19/19  5465      History   Chief Complaint Chief Complaint  Patient presents with  . Wound Check    HPI [name] is a 48 y.o. male.   49 year old male with history of insulin-dependent diabetes, CAD, CHF, HTN, HLD comes in for 2-week history of left foot wound to the plantar surface.  Patient states he did not realize there was a hole in his shoe, causing wound to the foot.  He had no sensation of pain, and did not realize he had a wound until 2 weeks ago when he was trimming his toenail.  Since then, has not noticed increase in size.  Denies spreading erythema, warmth, drainage.  Denies fever, chills, body aches.  He has been dressing it with Neosporin daily without any improvement.  A1c uncontrolled at 13.4.      Past Medical History:  Diagnosis Date  . CAD (coronary artery disease)    NSTEMI 10/2011: LHC 11/04/11: pLAD 90%, mLAD 60-70%, small D2 sub totally occluded at ostium, small OM1 90% ostial, 90% mid, mOM2 30%, oPL 80%, RCA 30%, dPDA 80%, EF 20% with ant AK.  PCI:  Promus DES to pLAD.  [name] Chronic systolic heart failure (HCC)   . DM2 (diabetes mellitus, type 2) (HCC)   . HTN (hypertension)   . Hyperlipidemia   . Ischemic cardiomyopathy    Echo 11/03/11: mod LVH, mild focal basal septal hypertrophy, EF 15%, grade 2 diast dysfxn, mild MR, mild to mod LAE, mild RVE, mild to mod reduced RVSF.  EF 3/5 50% by echo  . Obesity     Patient Active Problem List   Diagnosis Date Noted  . Testosterone deficiency 11/29/2016  . Acute systolic CHF (congestive heart failure) (HCC) 10/28/2016  . Indigestion 07/11/2016  . Renal insufficiency 07/11/2016  . Chronic systolic heart failure (HCC) 11/13/2011  . CAD 11/13/2011  . Ischemic cardiomyopathy 11/04/2011  . Acute systolic congestive heart failure (HCC) 11/04/2011  . MI, acute, non ST segment elevation (HCC) 11/03/2011  . Diabetes mellitus type 2 in  obese (HCC) 12/14/2010  . HYPERLIPIDEMIA 12/14/2010  . Obesity 12/14/2010  . Essential hypertension, benign 12/14/2010    Past Surgical History:  Procedure Laterality Date  . CARDIAC CATHETERIZATION N/A 10/25/2016   Procedure: Right/Left Heart Cath and Coronary Angiography;  Surgeon: Marykay Lex, MD;  Location: [facility] INVASIVE CV LAB;  Service: Cardiovascular;  Laterality: N/A;  . CARDIAC CATHETERIZATION N/A 10/25/2016   Procedure: Coronary Stent Intervention;  Surgeon: Marykay Lex, MD;  Location: [facility] INVASIVE CV LAB;  Service: Cardiovascular;  Laterality: N/A;  . CARDIAC CATHETERIZATION N/A 10/28/2016   Procedure: Coronary Stent Intervention;  Surgeon: Kathleene Hazel, MD;  Location: [facility] INVASIVE CV LAB;  Service: Cardiovascular;  Laterality: N/A;  . LEFT AND RIGHT HEART CATHETERIZATION WITH CORONARY ANGIOGRAM N/A 11/04/2011   Procedure: LEFT AND RIGHT HEART CATHETERIZATION WITH CORONARY ANGIOGRAM;  Surgeon: Rollene Rotunda, MD;  Location: [facility] CATH LAB;  Service: Cardiovascular;  Laterality: N/A;  . NONE    . PERCUTANEOUS CORONARY STENT INTERVENTION (PCI-S)  11/04/2011   Procedure: PERCUTANEOUS CORONARY STENT INTERVENTION (PCI-S);  Surgeon: Tonny Bollman, MD;  Location: [facility] CATH LAB;  Service: Cardiovascular;;       Home Medications    Prior to Admission medications   Medication Sig Start Date End Date Taking? Authorizing Provider  aspirin 81 MG chewable tablet Chew 1 tablet (81 mg total)  by mouth daily. 10/31/16   Shirley Friar, PA-C  atorvastatin (LIPITOR) 80 MG tablet Take 1 tablet (80 mg total) by mouth daily. 09/24/19   Larey Dresser, MD  Blood Glucose Monitoring Suppl (TRUE METRIX METER) DEVI 1 each by Does not apply route 3 (three) times daily. 03/16/19   Charlott Rakes, MD  carvedilol (COREG) 6.25 MG tablet Take 1 tablet (6.25 mg total) by mouth 2 (two) times daily with a meal. 08/31/19   Larey Dresser, MD  dapagliflozin propanediol (FARXIGA) 10 MG TABS  tablet Take 10 mg by mouth daily before breakfast. 08/31/19   Larey Dresser, MD  digoxin (LANOXIN) 0.125 MG tablet Take 1 tablet (125 mcg total) by mouth daily. 09/29/19   Larey Dresser, MD  doxycycline (VIBRAMYCIN) 100 MG capsule Take 1 capsule (100 mg total) by mouth 2 (two) times daily. 12/19/19   Tasia Catchings, Cristiana Yochim V, PA-C  furosemide (LASIX) 40 MG tablet Take 1 tablet (40 mg total) by mouth daily. 06/29/19   Larey Dresser, MD  glipiZIDE (GLUCOTROL) 10 MG tablet TAKE 1 TABLET BY MOUTH TWICE DAILY BEFORE MEAL(S). Must have office visit for refills 11/19/19   Charlott Rakes, MD  glucose blood (TRUE METRIX BLOOD GLUCOSE TEST) test strip 1 each by Other route 3 (three) times daily. 03/16/19   Charlott Rakes, MD  Insulin Glargine (LANTUS SOLOSTAR) 100 UNIT/ML Solostar Pen Inject 15 Units into the skin daily. 03/16/19   Charlott Rakes, MD  mupirocin ointment (BACTROBAN) 2 % Apply 1 application topically 2 (two) times daily. 12/19/19   Tasia Catchings, Nakhi Choi V, PA-C  omeprazole (PRILOSEC) 20 MG capsule TAKE 1 CAPSULE BY MOUTH ONCE A DAY 11/29/19   Larey Dresser, MD  sacubitril-valsartan (ENTRESTO) 97-103 MG Take 1 tablet by mouth 2 (two) times daily. 03/02/19   Larey Dresser, MD  sildenafil (REVATIO) 20 MG tablet Take 1 tablet (20 mg total) by mouth as needed (for erectile dysfunction). 08/31/19   Larey Dresser, MD  spironolactone (ALDACTONE) 50 MG tablet TAKE 1 TABLET BY MOUTH ONCE DAILY 09/29/19   Larey Dresser, MD  TRUEplus Lancets 28G MISC 1 each by Does not apply route 3 (three) times daily. 03/16/19   Charlott Rakes, MD  TRUEPLUS PEN NEEDLES 31G X 5 MM MISC USE AS DIRECTED AT BEDTIME. 11/19/19   Charlott Rakes, MD    Family History Family History  Problem Relation Age of Onset  . Heart disease Mother        MOTHER HAD CABG    Social History Social History   Tobacco Use  . Smoking status: Never Smoker  . Smokeless tobacco: Never Used  Substance Use Topics  . Alcohol use: Yes    Alcohol/week: 1.0  standard drinks    Types: 1 Shots of liquor per week    Comment: OCCASIONAL  . Drug use: No     Allergies   Patient has no known allergies.   Review of Systems Review of Systems  Reason unable to perform ROS: See HPI as above.     Physical Exam Triage Vital Signs ED Triage Vitals [12/19/19 1043]  Enc Vitals Group     BP (!) 155/93     Pulse Rate 97     Resp 18     Temp 98.4 F (36.9 C)     Temp Source Oral     SpO2 97 %     Weight      Height      Head  Circumference      Peak Flow      Pain Score 0     Pain Loc      Pain Edu?      Excl. in GC?    No data found.  Updated Vital Signs BP (!) 155/93 (BP Location: Left Arm)   Pulse 97   Temp 98.4 F (36.9 C) (Oral)   Resp 18   SpO2 97%   Physical Exam Constitutional:      General: He is not in acute distress.    Appearance: He is well-developed. He is not diaphoretic.  HENT:     Head: Normocephalic and atraumatic.  Eyes:     Conjunctiva/sclera: Conjunctivae normal.     Pupils: Pupils are equal, round, and reactive to light.  Pulmonary:     Effort: Pulmonary effort is normal. No respiratory distress.  Musculoskeletal:     Comments: Left foot wound to the plantar surface. See picture below. 1.5cm x 1.5cm round ulceration without obvious muscle involvement. No drainage, warmth, erythema, tenderness.  Skin:    General: Skin is warm and dry.  Neurological:     Mental Status: He is alert and oriented to person, place, and time.        UC Treatments / Results  Labs (all labs ordered are listed, but only abnormal results are displayed) Labs Reviewed - No data to display  EKG   Radiology No results found.  Procedures Procedures (including critical care time)  Medications Ordered in UC Medications - No data to display  Initial Impression / Assessment and Plan / UC Course  I have reviewed the triage vital signs and the nursing notes.  Pertinent labs & imaging results that were available during  my care of the patient were reviewed by me and considered in my medical decision making (see chart for details).    No obvious signs of cellulitis at this time. However, given 2 week history of wound and will be holiday this next week, will start patient on doxycycline to prevent possible developing infection. Wound care instructions given. Otherwise, patient to follow up with podiatrist/wound center for wound monitoring and care. Return precautions given. Patient expresses understanding and agrees to plan.  Final Clinical Impressions(s) / UC Diagnoses   Final diagnoses:  Ulcer of left foot, limited to breakdown of skin [facility](HCC)   ED Prescriptions    Medication Sig Dispense Auth. Provider   doxycycline (VIBRAMYCIN) 100 MG capsule Take 1 capsule (100 mg total) by mouth 2 (two) times daily. 20 capsule Cathie HoopsYu, Daniyah Fohl V, PA-C   mupirocin ointment (BACTROBAN) 2 % Apply 1 application topically 2 (two) times daily. 22 g Belinda FisherYu, Decarlos Empey V, PA-C     PDMP not reviewed this encounter.   Belinda FisherYu, Dorrine Montone V, PA-C 12/19/19 1247

## 2019-12-27 ENCOUNTER — Other Ambulatory Visit (HOSPITAL_COMMUNITY): Payer: Self-pay | Admitting: Cardiology

## 2019-12-27 MED FILL — OMEPRAZOLE 20 MG CAPSULE DR: 20 | 30 days supply | Qty: 30 | Fill #1

## 2019-12-27 MED FILL — SPIRONOLACTONE 50 MG TABLET: 50 | 30 days supply | Qty: 30 | Fill #0

## 2019-12-30 MED FILL — DIGOXIN 0.125 MG TABLET: 125 | 30 days supply | Qty: 30 | Fill #0

## 2019-12-30 MED FILL — ATORVASTATIN 80 MG TABLET: 80 | 30 days supply | Qty: 30 | Fill #3

## 2020-01-03 ENCOUNTER — Other Ambulatory Visit: Payer: Self-pay | Admitting: Family Medicine

## 2020-01-03 DIAGNOSIS — I2510 Atherosclerotic heart disease of native coronary artery without angina pectoris: Secondary | ICD-10-CM

## 2020-01-04 ENCOUNTER — Telehealth: Payer: Self-pay | Admitting: Family Medicine

## 2020-01-04 DIAGNOSIS — I2510 Atherosclerotic heart disease of native coronary artery without angina pectoris: Secondary | ICD-10-CM

## 2020-01-04 MED ORDER — GLIPIZIDE 10 MG PO TABS: ORAL_TABLET | ORAL | 0 refills | Status: DC

## 2020-01-04 MED FILL — glipiZIDE 10 MG TABS: 10 | 30 days supply | Qty: 60 | Fill #0

## 2020-01-04 NOTE — Telephone Encounter (Signed)
1) Medication(s) Requested (by name): -glipiZIDE (GLUCOTROL) 10 MG tablet   2) Pharmacy of Choice: -[facility] - [place], [place] - [address]   3) Special Requests: -Pt needs a refill until his follow up appt on 01/24/2019

## 2020-01-04 NOTE — Telephone Encounter (Signed)
Refill sent.

## 2020-01-11 ENCOUNTER — Other Ambulatory Visit: Payer: Self-pay

## 2020-01-11 ENCOUNTER — Encounter (HOSPITAL_BASED_OUTPATIENT_CLINIC_OR_DEPARTMENT_OTHER): Payer: Self-pay | Admitting: Internal Medicine

## 2020-01-11 DIAGNOSIS — L97522 Non-pressure chronic ulcer of other part of left foot with fat layer exposed: Secondary | ICD-10-CM | POA: Insufficient documentation

## 2020-01-11 DIAGNOSIS — I252 Old myocardial infarction: Secondary | ICD-10-CM | POA: Insufficient documentation

## 2020-01-11 DIAGNOSIS — E785 Hyperlipidemia, unspecified: Secondary | ICD-10-CM | POA: Insufficient documentation

## 2020-01-11 DIAGNOSIS — I2510 Atherosclerotic heart disease of native coronary artery without angina pectoris: Secondary | ICD-10-CM | POA: Insufficient documentation

## 2020-01-11 DIAGNOSIS — I130 Hypertensive heart and chronic kidney disease with heart failure and stage 1 through stage 4 chronic kidney disease, or unspecified chronic kidney disease: Secondary | ICD-10-CM | POA: Insufficient documentation

## 2020-01-11 DIAGNOSIS — E1122 Type 2 diabetes mellitus with diabetic chronic kidney disease: Secondary | ICD-10-CM | POA: Insufficient documentation

## 2020-01-11 DIAGNOSIS — N189 Chronic kidney disease, unspecified: Secondary | ICD-10-CM | POA: Insufficient documentation

## 2020-01-11 DIAGNOSIS — I5022 Chronic systolic (congestive) heart failure: Secondary | ICD-10-CM | POA: Insufficient documentation

## 2020-01-11 DIAGNOSIS — Z794 Long term (current) use of insulin: Secondary | ICD-10-CM | POA: Insufficient documentation

## 2020-01-11 DIAGNOSIS — E11621 Type 2 diabetes mellitus with foot ulcer: Secondary | ICD-10-CM | POA: Insufficient documentation

## 2020-01-11 DIAGNOSIS — E1142 Type 2 diabetes mellitus with diabetic polyneuropathy: Secondary | ICD-10-CM | POA: Insufficient documentation

## 2020-01-11 NOTE — Progress Notes (Addendum)
[name], [name] ([id number]) Visit Report for 01/11/2020 Chief Complaint Document Details Patient Name: Date of Service: [name], [name] 01/11/2020 1:15 PM Medical Record Number: [id number] Patient Account Number: [id number] Date of Birth/Sex: Treating RN: 06/30/1971 (48 y.o. M) Primary Care Provider: Newlin, Glenn Other Clinician: Referring Provider: Treating Provider/Extender: [name] [name], Glenn [name] Treatment: 0 Information Obtained from: Patient Chief Complaint 01/11/2020; patient is here for review on his plantar left foot Electronic Signature(s) Signed: 01/11/2020 6:16:53 PM By: [name], Michael MD Entered By: [name] on 01/11/2020 15:55:22 -------------------------------------------------------------------------------- Debridement Details Patient Name: Date of Service: [name], [name] 01/11/2020 1:15 PM Medical Record Number: [id number] Patient Account Number: [id number] Date of Birth/Sex: Treating RN: 04/26/1971 (48 y.o. M) Primary Care Provider: Newlin, Glenn Other Clinician: Referring Provider: Treating Provider/Extender: [name] [name], Glenn [name] Treatment: 0 Debridement Performed for Assessment: Wound #1 Left Metatarsal head third Performed By: Physician [name] G., MD Debridement Type: Debridement Severity of Tissue Pre Debridement: Fat layer exposed Level of Consciousness (Pre-procedure): Awake and Alert Pre-procedure Verification/Time Out Yes - 15:38 Taken: Start Time: 15:38 Pain Control: Lidocaine 5% topical ointment T Area Debrided (L x W): otal 1 (cm) x 1 (cm) = 1 (cm) Tissue and other material debrided: Viable, Non-Viable, Callus, Slough, Subcutaneous, Slough Level: Skin/Subcutaneous Tissue Debridement Description: Excisional Instrument: Blade, Curette, Forceps Specimen: Tissue Culture Number of Specimens T aken: 1 Bleeding: Moderate Hemostasis Achieved: Pressure End Time: 15:45 Procedural  Pain: 0 Post Procedural Pain: 0 Response to Treatment: Procedure was tolerated well Level of Consciousness (Post- Awake and Alert procedure): Post Debridement Measurements of Total Wound Length: (cm) 1 Width: (cm) 1 Depth: (cm) 0.5 Volume: (cm) 0.393 Character of Wound/Ulcer Post Debridement: Improved Severity of Tissue Post Debridement: Fat layer exposed Post Procedure Diagnosis Same as Pre-procedure Electronic Signature(s) Signed: 01/11/2020 6:16:53 PM By: [name], Michael MD Entered By: [name] on 01/11/2020 15:54:49 -------------------------------------------------------------------------------- HPI Details Patient Name: Date of Service: [name], [name] 01/11/2020 1:15 PM Medical Record Number: [id number] Patient Account Number: [id number] Date of Birth/Sex: Treating RN: 12/18/1971 (48 y.o. M) Primary Care Provider: Newlin, Glenn Other Clinician: Referring Provider: Treating Provider/Extender: [name] [name], Glenn [name] Treatment: 0 History of Present Illness HPI Description: ADMISSION 01/11/2020 This is a 48-year-old man with uncontrolled type 2 diabetes with a recent hemoglobin A1c earlier this year of 13.4. He is on insulin and glipizide. He does not take his blood sugars at home. He does have a follow-up with primary care later this month I believe on January 27. He tells us that roughly a month ago he was walking with a shoe with a hole [name]. He took the shoe off and there was an open wound at roughly the left fourth met head. This has significant undermining and raised edges. He has not noticed any purulence he does not feel unwell. More recently he was taking skin off the bottom of his foot and has a superficial area on the left fifth met head. He has not been offloading this. The patient was [name] on 12/20. They gave him Bactroban which she has been using on the wound and 10 days worth of doxycycline. No x-rays were done. He has not  had vascular studies. He is also been using hydrogen peroxide. Past medical history type 2 diabetes uncontrolled, chronic systolic heart failure, coronary artery disease with a history of congestive heart failure with stents. Hypertension hyperlipidemia and chronic renal insufficiency   ABI [name] this clinic was 1.14 on the left. 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 He is uncertain whether he would be able to work if we put him on some form of restriction Electronic Signature(s) Signed: 01/11/2020 6:16:53 PM By: Glenn Ham MD Entered By: [name] on 01/11/2020 15:58:24 -------------------------------------------------------------------------------- Physical Exam Details Patient Name: Date of Service: [name], [name] 01/11/2020 1:15 PM Medical Record Number: [id number] Patient Account Number: [id number] Date of Birth/Sex: Treating RN: Dec 02, 1971 (49 y.o. M) Primary Care Provider: Charlott [name] Other Clinician: Referring Provider: Treating Provider/Extender: [name], [name] [name] Treatment: 0 Constitutional Sitting or standing Blood Pressure is within target range for patient.. Pulse regular and within target range for patient.[name] Respirations regular, non-labored and within target range.. Temperature is normal and within the target range for the patient.[name] Appears [name] no distress. Eyes Conjunctivae clear. No discharge.no icterus. Respiratory work of breathing is normal. Cardiovascular No pulses were normal [name] the left foot.. Musculoskeletal . Integumentary (Hair, Skin) No erythema or tenderness around the wound. Neurological Markedly reduced sensation to vibration and microfilament [name] the left foot. Psychiatric appears at normal baseline. Notes Wound exam; Over the left fourth metatarsal head a punched out area with undermining thick raised callus subcutaneous tissue around the wound margin and necrotic debris on the  surface. I removed callus and subcutaneous tissue with pickups and a #15 scalpel. I then remove necrotic tissue from the base of the wound. This does not probe to bone. A swab culture for CandS was done The patient has a more superficial area over the left fifth met head. This is simply breakdown of skin. This looks like it will close over. Electronic Signature(s) Signed: 01/11/2020 6:16:53 PM By: Glenn Ham MD Entered By: [name] on 01/11/2020 16:00:34 -------------------------------------------------------------------------------- Physician Orders Details Patient Name: Date of Service: [name], [name] 01/11/2020 1:15 PM Medical Record Number: [id number] Patient Account Number: [id number] Date of Birth/Sex: Treating RN: [birth year] (48 y.o. [name] Primary Care Provider: Charlott [name] Other Clinician: Referring Provider: Treating Provider/Extender: Sindy Guadeloupe [name] Treatment: 0 Verbal / Phone Orders: No Diagnosis Coding Follow-up Appointments Return Appointment [name] 1 week. Dressing Change Frequency Change dressing every day. Wound Cleansing May shower and wash wound with soap and water. Primary Wound Dressing Calcium Alginate with Silver Secondary Dressing Kerlix/Rolled Gauze - secure with tape Dry Gauze Off-Loading Other: - front off loading shoe Laboratory naerobe culture (MICRO) - non healing wound Bacteria identified [name] Unspecified specimen by A LOINC Code: 540-0 Convenience Name: Anerobic culture Electronic Signature(s) Signed: 01/20/2020 5:54:58 PM By: Glenn Ham MD Signed: 06/27/2020 5:27:17 PM By: Carlene Coria RN Previous Signature: 01/11/2020 6:16:53 PM Version By: Glenn Ham MD Previous Signature: 01/11/2020 6:18:05 PM Version By: Carlene Coria RN Entered By: Carlene Coria on 01/18/2020 09:31:25 -------------------------------------------------------------------------------- Problem List Details Patient  Name: Date of Service: [name], [name] 01/11/2020 1:15 PM Medical Record Number: [id number] Patient Account Number: [id number] Date of Birth/Sex: Treating RN: 1971-06-14 (49 y.o. M) Primary Care Provider: Charlott [name] Other Clinician: Referring Provider: Treating Provider/Extender: Sindy Guadeloupe [name] Treatment: 0 Active Problems ICD-10 Evaluated Encounter Code Description Active Date Today Diagnosis E11.621 Type 2 diabetes mellitus with foot ulcer 01/11/2020 No Yes L97.522 Non-pressure chronic ulcer of other part of left foot with fat layer exposed 01/11/2020 No Yes L97.521 Non-pressure chronic ulcer of other part of left foot limited to  breakdown of 01/11/2020 No Yes skin E11.42 Type 2 diabetes mellitus with diabetic polyneuropathy 01/11/2020 No Yes Inactive Problems Resolved Problems Electronic Signature(s) Signed: 01/11/2020 6:16:53 PM By: Glenn Ham MD Entered By: [name] on 01/11/2020 15:54:31 -------------------------------------------------------------------------------- Progress Note Details Patient Name: Date of Service: [name], [name] 01/11/2020 1:15 PM Medical Record Number: [id number] Patient Account Number: [id number] Date of Birth/Sex: Treating RN: 02/27/1971 (49 y.o. M) Primary Care Provider: Charlott [name] Other Clinician: Referring Provider: Treating Provider/Extender: [name], [name] [name] Treatment: 0 Subjective Chief Complaint Information obtained from Patient 01/11/2020; patient is here for review on his plantar left foot History of Present Illness (HPI) ADMISSION 01/11/2020 This is a 49 year old man with uncontrolled type 2 diabetes with a recent hemoglobin A1c earlier this year of 13.4. He is on insulin and glipizide. He does not take his blood sugars at home. He does have a follow-up with primary care later this month I believe on January 27. He tells [place] that roughly a month ago  he was walking with a shoe with a hole [name]. He took the shoe off and there was an open wound at roughly the left fourth met head. This has significant undermining and raised edges. He has not noticed any purulence he does not feel unwell. More recently he was taking skin off the bottom of his foot and has a superficial area on the left fifth met head. He has not been offloading this. The patient was [name] on 12/20. They gave him Bactroban which she has been using on the wound and 10 days worth of doxycycline. No x-rays were done. He has not had vascular studies. He is also been using hydrogen peroxide. Past medical history type 2 diabetes uncontrolled, chronic systolic heart failure, coronary artery disease with a history of congestive heart failure with stents. Hypertension hyperlipidemia and chronic renal insufficiency ABI [name] this clinic was 1.14 on the left. 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 He is uncertain whether he would be able to work if we put him on some form of restriction Patient History Information obtained from Patient. Allergies No Known Drug Allergies Family History Heart Disease - Mother,Father, Hypertension, No family history of Cancer, Diabetes, Hereditary Spherocytosis, Kidney Disease, Lung Disease, Seizures, Stroke, Thyroid Problems. Social History Never smoker, Marital Status - Single, Alcohol Use - Rarely, Drug Use - No History, Caffeine Use - Moderate. Medical History Cardiovascular Patient has history of Congestive Heart Failure, Coronary Artery Disease, Hypertension, Myocardial Infarction - age 83 Endocrine Patient has history of Type II Diabetes Genitourinary Denies history of End Stage Renal Disease Integumentary (Skin) Denies history of History of Burn Neurologic Patient has history of Neuropathy Patient is treated with Insulin, Oral Agents. Blood sugar is not tested. Medical A Surgical History  Notes nd Cardiovascular Ischemic Cardiomyopathy Genitourinary Renal Insufficiency Review of Systems (ROS) Constitutional Symptoms (General Health) Denies complaints or symptoms of Fatigue, Fever, Chills, Marked Weight Change. Eyes Denies complaints or symptoms of Dry Eyes, Vision Changes, Glasses / Contacts. Ear/Nose/Mouth/Throat Denies complaints or symptoms of Chronic sinus problems or rhinitis. Respiratory Denies complaints or symptoms of Chronic or frequent coughs, Shortness of Breath. Cardiovascular Denies complaints or symptoms of Chest pain. Gastrointestinal Denies complaints or symptoms of Frequent diarrhea, Nausea, Vomiting. Genitourinary Denies complaints or symptoms of Frequent urination. Integumentary (Skin) Complains or has symptoms of Wounds - 2 wounds on left foot. Musculoskeletal Denies complaints or symptoms of Muscle  Pain, Muscle Weakness. Psychiatric Denies complaints or symptoms of Claustrophobia, Suicidal. Objective Constitutional Sitting or standing Blood Pressure is within target range for patient.. Pulse regular and within target range for patient.[name] Respirations regular, non-labored and within target range.. Temperature is normal and within the target range for the patient.[name] Appears [name] no distress. Vitals Time Taken: 2:46 PM, Height: 78 [name], Source: Stated, Weight: 285 lbs, Source: Stated, BMI: 32.9, Temperature: 98.5 F, Pulse: 87 bpm, Respiratory Rate: 20 breaths/min, Blood Pressure: 125/85 mmHg. Eyes Conjunctivae clear. No discharge.no icterus. Respiratory work of breathing is normal. Cardiovascular No pulses were normal [name] the left foot.. Neurological Markedly reduced sensation to vibration and microfilament [name] the left foot. Psychiatric appears at normal baseline. General Notes: Wound exam; ooOver the left fourth metatarsal head a punched out area with undermining thick raised callus subcutaneous tissue around the wound margin and necrotic  debris on the surface. I removed callus and subcutaneous tissue with pickups and a #15 scalpel. I then remove necrotic tissue from the base of the wound. This does not probe to bone. A swab culture for CandS was done ooThe patient has a more superficial area over the left fifth met head. This is simply breakdown of skin. This looks like it will close over. Integumentary (Hair, Skin) No erythema or tenderness around the wound. Wound #1 status is Open. Original cause of wound was Trauma. The wound is located on the Left Metatarsal head third. The wound measures 1cm length x 1cm width x 0.5cm depth; 0.785cm^2 area and 0.393cm^3 volume. There is Fat Layer (Subcutaneous Tissue) Exposed exposed. There is no tunneling noted, however, there is undermining starting at 12:00 and ending at 12:00 with a maximum distance of 0.4cm. There is a medium amount of serosanguineous drainage noted. The wound margin is flat and intact. There is small (1-33%) pink granulation within the wound bed. There is a large (67-100%) amount of necrotic tissue within the wound bed including Adherent Slough. Wound #2 status is Open. Original cause of wound was Trauma. The wound is located on the Left Metatarsal head fifth. The wound measures 0.7cm length x 0.5cm width x 0.1cm depth; 0.275cm^2 area and 0.027cm^3 volume. There is Fat Layer (Subcutaneous Tissue) Exposed exposed. There is no tunneling or undermining noted. There is a small amount of serosanguineous drainage noted. The wound margin is flat and intact. There is medium (34-66%) pink granulation within the wound bed. There is a medium (34-66%) amount of necrotic tissue within the wound bed including Adherent Slough. 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 There was a Excisional Skin/Subcutaneous Tissue Debridement with a total area of 1 sq cm performed by Ricard Dillon., MD. With the following instrument(s): Blade, Curette, and Forceps to remove Viable and Non-Viable tissue/material. Material removed includes Callus, Subcutaneous Tissue, and Slough after achieving pain control using Lidocaine 5% topical ointment. 1 specimen was taken by a Tissue Culture and sent to the lab per facility protocol. A time out was conducted at 15:38, prior to the start of the procedure. A Moderate amount of bleeding was controlled with Pressure. The  procedure was tolerated well with a pain level of 0 throughout and a pain level of 0 following the procedure. Post Debridement Measurements: 1cm length x 1cm width x 0.5cm depth; 0.393cm^3 volume. Character of Wound/Ulcer Post Debridement is improved. Severity of Tissue Post Debridement is: Fat layer exposed. Post procedure Diagnosis Wound #1: Same as Pre-Procedure Plan Follow-up Appointments: Return Appointment [name] 1 week. Dressing Change Frequency: Change dressing every day. Wound Cleansing: May shower and wash wound with soap and water. Primary Wound Dressing: Calcium Alginate with Silver Secondary Dressing: Kerlix/Rolled Gauze - secure with tape Dry Gauze Off-Loading: Other: - front off loading shoe #1 post debridement a culture was done but no empiric antibiotics. This was done because these undermining hyper callused areas often harbor bacteria. 2. Post debridement we will use silver alginate on the wound. 3. We gave him a forefoot offloading boot 4 I asked him to talk to his boss about whether he can work by doing something less on his feet. I also talked about a total  contact cast 5. X-ray of the left foot. 6. I did not think there was significant arterial issues here his ABI was 1.14 peripheral pulses are robust. No arterial formal studies were ordered 7. The patient does not have insurance. Therefore we will have to be judicious about what products we will order for him. I spent 35 minutes [name] the research of this patient's chart [name] epic, face-to-face examination and discussion with the patient and creation of this record Electronic Signature(s) Signed: 01/11/2020 6:16:53 PM By: [name], Michael MD Entered By: [name] on 01/11/2020 16:04:34 -------------------------------------------------------------------------------- HxROS Details Patient Name: Date of Service: [name], [name] 01/11/2020 1:15 PM Medical Record Number: [id number] Patient Account Number: [id number] Date of Birth/Sex: Treating RN: 04/23/1971 (48 y.o. M) Lynch, Shatara Primary Care Provider: Newlin, Glenn Other Clinician: Referring Provider: Treating Provider/Extender: [name] [name], Glenn [name] Treatment: 0 Information Obtained From Patient Constitutional Symptoms (General Health) Complaints and Symptoms: Negative for: Fatigue; Fever; Chills; Marked Weight Change Eyes Complaints and Symptoms: Negative for: Dry Eyes; Vision Changes; Glasses / Contacts Ear/Nose/Mouth/Throat Complaints and Symptoms: Negative for: Chronic sinus problems or rhinitis Respiratory Complaints and Symptoms: Negative for: Chronic or frequent coughs; Shortness of Breath Cardiovascular Complaints and Symptoms: Negative for: Chest pain Medical History: Positive for: Congestive Heart Failure; Coronary Artery Disease; Hypertension; Myocardial Infarction - age 39 Past Medical History Notes: Ischemic Cardiomyopathy Gastrointestinal Complaints and Symptoms: Negative for: Frequent diarrhea; Nausea; Vomiting Genitourinary Complaints and Symptoms: Negative for: Frequent  urination Medical History: Negative for: End Stage Renal Disease Past Medical History Notes: Renal Insufficiency Integumentary (Skin) Complaints and Symptoms: Positive for: Wounds - 2 wounds on left foot Medical History: Negative for: History of Burn Musculoskeletal Complaints and Symptoms: Negative for: Muscle Pain; Muscle Weakness Psychiatric Complaints and Symptoms: Negative for: Claustrophobia; Suicidal Hematologic/Lymphatic Endocrine Medical History: Positive for: Type II Diabetes Time with diabetes: over 5 years Treated with: Insulin, Oral agents Blood sugar tested every day: No Immunological Neurologic Medical History: Positive for: Neuropathy Oncologic Immunizations Pneumococcal Vaccine: Received Pneumococcal Vaccination: No Implantable Devices None Family and Social History Cancer: No; Diabetes: No; Heart Disease: Yes - Mother,Father; Hereditary Spherocytosis: No; Hypertension: Yes; Kidney Disease: No; Lung Disease: No; Seizures: No; Stroke: No; Thyroid Problems: No; Never smoker; Marital Status - Single; Alcohol Use: Rarely; Drug Use: No History; Caffeine Use: Moderate; Financial Concerns: No; Food, Clothing or Shelter Needs: No; Support System Lacking: No; Transportation Concerns: No Electronic Signature(s) Signed: 01/11/2020 6:16:53 PM   By: Glenn Ham MD Signed: 01/11/2020 6:18:44 PM By: Levan Hurst RN, BSN Entered By: Levan Hurst on 01/11/2020 15:10:28 -------------------------------------------------------------------------------- SuperBill Details Patient Name: Date of Service: [name], [name] 01/11/2020 Medical Record Number: [id number] Patient Account Number: [id number] Date of Birth/Sex: Treating RN: [birth year] (49 y.o. M) Primary Care Provider: Charlott [name] Other Clinician: Referring Provider: Treating Provider/Extender: [name], [name] [name] Treatment: 0 Diagnosis Coding ICD-10 Codes Code  Description E11.621 Type 2 diabetes mellitus with foot ulcer L97.522 Non-pressure chronic ulcer of other part of left foot with fat layer exposed L97.521 Non-pressure chronic ulcer of other part of left foot limited to breakdown of skin E11.42 Type 2 diabetes mellitus with diabetic polyneuropathy Facility Procedures CPT4 Code: [id number] Description: [place] VISIT-LEV 3 EST PT Modifier: 25 Quantity: 1 CPT4 Code: [id number] Description: [place] - DEB SUBQ TISSUE 20 SQ CM/< ICD-10 Diagnosis Description L97.522 Non-pressure chronic ulcer of other part of left foot with fat layer exposed Modifier: Quantity: 1 Physician Procedures : CPT4 Code Description Modifier [id number] WC PHYS LEVEL 3 NEW PT 25 ICD-10 Diagnosis Description L97.522 Non-pressure chronic ulcer of other part of left foot with fat layer exposed L97.521 Non-pressure chronic ulcer of other part of left foot limited to  breakdown of skin E11.621 Type 2 diabetes mellitus with foot ulcer E11.42 Type 2 diabetes mellitus with diabetic polyneuropathy Quantity: 1 : [id number] - WC PHYS SUBQ TISS 20 SQ CM ICD-10 Diagnosis Description L97.522 Non-pressure chronic ulcer of other part of left foot with fat layer exposed Quantity: 1 Electronic Signature(s) Signed: 01/11/2020 6:16:53 PM By: Glenn Ham MD Signed: 01/11/2020 6:18:05 PM By: Carlene Coria RN Entered By: Carlene Coria on 01/11/2020 16:20:33

## 2020-01-11 NOTE — Progress Notes (Signed)
Glenn, [name] ([id number]) Visit Report for 01/11/2020 Abuse/Suicide Risk Screen Details Patient Name: Date of Service: [name], [name] 01/11/2020 1:15 PM Medical Record [id number] Patient Account Number: [id number] Date of Birth/Sex: June 21, 1971 (48 y.o. Male) Treating RN: Zandra Abts Primary Care Vivianne Carles: Hoy Register Other Clinician: Referring Millena Callins: Treating Elliyah Liszewski/Extender:Robson, Gweneth Fritter, Enobong Weeks in Treatment: 0 Abuse/Suicide Risk Screen Items Answer ABUSE RISK SCREEN: Has anyone close to you tried to hurt or harm you recentlyo No Do you feel uncomfortable with anyone in your familyo No Has anyone forced you do things that you didnt want to doo No Electronic Signature(s) Signed: 01/11/2020 6:18:44 PM By: Zandra Abts RN, BSN Entered By: Zandra Abts on 01/11/2020 15:10:32 -------------------------------------------------------------------------------- Activities of Daily Living Details Patient Name: Date of Service: Glenn, [name] 01/11/2020 1:15 PM Medical Record [id number] Patient Account Number: [id number] Date of Birth/Sex: [birth year] (48 y.o. Male) Treating RN: Zandra Abts Primary Care Ameenah Prosser: Hoy Register Other Clinician: Referring Carrine Kroboth: Treating Sharice Harriss/Extender:Robson, Gweneth Fritter, Enobong Weeks in Treatment: 0 Activities of Daily Living Items Answer Activities of Daily Living (Please select one for each item) Drive Automobile Completely Able Take Medications Completely Able Use Telephone Completely Able Care for Appearance Completely Able Use Toilet Completely Able Bath / Shower Completely Able Dress Self Completely Able Feed Self Completely Able Walk Completely Able Get In / Out Bed Completely Able Housework Completely Able Prepare Meals Completely Able Handle Money Completely Able Shop for Self Completely Able Electronic Signature(s) Signed: 01/11/2020 6:18:44 PM By: Zandra Abts RN,  BSN Entered By: Zandra Abts on 01/11/2020 15:10:56 -------------------------------------------------------------------------------- Education Screening Details Patient Name: Date of Service: Glenn, [name] 01/11/2020 1:15 PM Medical Record [id number] Patient Account Number: [id number] Date of Birth/Sex: [birth year] (48 y.o. Male) Treating RN: Zandra Abts Primary Care Lyndia Bury: Hoy Register Other Clinician: Referring Lafonda Patron: Treating Britiney Blahnik/Extender:Robson, Gweneth Fritter, Sherlie Ban in Treatment: 0 Primary Learner Assessed: Patient Learning Preferences/Education Level/Primary Language Learning Preference: Explanation, Demonstration, Printed Material Highest Education Level: High School Preferred Language: English Cognitive Barrier Language Barrier: No Translator Needed: No Memory Deficit: No Emotional Barrier: No Cultural/Religious Beliefs Affecting Medical Care: No Physical Barrier Impaired Vision: No Impaired Hearing: No Decreased Hand dexterity: No Knowledge/Comprehension Knowledge Level: High Comprehension Level: High Ability to understand written High instructions: Ability to understand verbal High instructions: Motivation Anxiety Level: Calm Cooperation: Cooperative Education Importance: Acknowledges Need Interest in Health Problems: Asks Questions Perception: Coherent Willingness to Engage in Self- High Management Activities: Readiness to Engage in Self- High Management Activities: Electronic Signature(s) Signed: 01/11/2020 6:18:44 PM By: Zandra Abts RN, BSN Entered By: Zandra Abts on 01/11/2020 15:11:15 -------------------------------------------------------------------------------- Fall Risk Assessment Details Patient Name: Date of Service: Glenn, [name] 01/11/2020 1:15 PM Medical Record [id number] Patient Account Number: [id number] Date of Birth/Sex: August 24, 1971 (49 y.o. Male) Treating RN: Zandra Abts Primary  Care Kahley Leib: Hoy Register Other Clinician: Referring Lova Urbieta: Treating Jody Silas/Extender:Robson, Gweneth Fritter, Enobong Weeks in Treatment: 0 Fall Risk Assessment Items Have you had 2 or more falls in the last 12 monthso 0 No Have you had any fall that resulted in injury in the last 12 monthso 0 No FALLS RISK SCREEN History of falling - immediate or within 3 months 0 No Secondary diagnosis (Do you have 2 or more medical diagnoseso) 15 Yes Ambulatory aid None/bed rest/wheelchair/nurse 0 Yes Crutches/cane/walker 0 No Furniture 0 No Intravenous therapy Access/Saline/Heparin Lock 0 No Weak (short steps with or without shuffle, stooped but able to lift head 0 No while walking, may seek support from furniture) Impaired (short steps with shuffle, may have  difficulty arising from chair, 0 No head down, impaired balance) Mental Status Oriented to own ability 0 Yes Overestimates or forgets limitations 0 No Risk Level: Low Risk Score: 15 Electronic Signature(s) Signed: 01/11/2020 6:18:44 PM By: Levan Hurst RN, BSN Entered By: Levan Hurst on 01/11/2020 15:11:27 -------------------------------------------------------------------------------- Foot Assessment Details Patient Name: Date of Service: Glenn, [name] 01/11/2020 1:15 PM Medical Record [id number] Patient Account Number: [id number] Date of Birth/Sex: 06-Apr-1971 (49 y.o. Male) Treating RN: Levan Hurst Primary Care Sherika Kubicki: Charlott Rakes Other Clinician: Referring Estuardo Frisbee: Treating Ariyana Faw/Extender:Robson, Sammuel Cooper, Enobong Weeks in Treatment: 0 Foot Assessment Items Site Locations + = Sensation present, - = Sensation absent, C = Callus, U = Ulcer R = Redness, W = Warmth, M = Maceration, PU = Pre-ulcerative lesion F = Fissure, S = Swelling, D = Dryness Assessment Right: Left: Other Deformity: No No Prior Foot Ulcer: No No Prior Amputation: No No Charcot Joint: No No Ambulatory Status:  Ambulatory Without Help Gait: Steady Electronic Signature(s) Signed: 01/11/2020 6:18:44 PM By: Levan Hurst RN, BSN Entered By: Levan Hurst on 01/11/2020 15:14:13 -------------------------------------------------------------------------------- Nutrition Risk Screening Details Patient Name: Date of Service: KODEN, HUNZEKER 01/11/2020 1:15 PM Medical Record [id number] Patient Account Number: [id number] Date of Birth/Sex: Jul 31, 1971 (49 y.o. Male) Treating RN: Levan Hurst Primary Care Lillith Mcneff: Charlott Rakes Other Clinician: Referring Otilio Groleau: Treating Arfa Lamarca/Extender:Robson, Sammuel Cooper, Enobong Weeks in Treatment: 0 Height (in): 78 Weight (lbs): 285 Body Mass Index (BMI): 32.9 Nutrition Risk Screening Items Score Screening NUTRITION RISK SCREEN: I have an illness or condition that made me change the kind and/or 2 Yes amount of food I eat I eat fewer than two meals per day 0 No I eat few fruits and vegetables, or milk products 0 No I have three or more drinks of beer, liquor or wine almost every day 0 No I have tooth or mouth problems that make it hard for me to eat 0 No I don't always have enough money to buy the food I need 0 No I eat alone most of the time 0 No I take three or more different prescribed or over-the-counter drugs a day 1 Yes 0 No Without wanting to, I have lost or gained 10 pounds in the last six months I am not always physically able to shop, cook and/or feed myself 0 No Nutrition Protocols Good Risk Protocol Provide education on Moderate Risk Protocol 0 nutrition High Risk Proctocol Risk Level: Moderate Risk Score: 3 Electronic Signature(s) Signed: 01/11/2020 6:18:44 PM By: Levan Hurst RN, BSN Entered By: Levan Hurst on 01/11/2020 15:11:42

## 2020-01-14 LAB — AEROBIC CULTURE W GRAM STAIN (SUPERFICIAL SPECIMEN): Gram Stain: NONE SEEN

## 2020-01-14 NOTE — Progress Notes (Signed)
SID, GREENER ([id number]) Visit Report for 01/11/2020 Allergy List Details Patient Name: Date of Service: [name], [name] 01/11/2020 1:15 PM Medical Record [id number] Patient Account Number: [id number] Date of Birth/Sex: Treating RN: 30-Sep-1971 (48 y.o. Male) Zandra Abts Primary Care Jennilee Demarco: Hoy Register Other Clinician: Referring Tuan Tippin: Treating Ashira Kirsten/Extender:Robson, Gweneth Fritter, Enobong Weeks in Treatment: 0 Allergies Active Allergies No Known Drug Allergies Allergy Notes Electronic Signature(s) Signed: 01/11/2020 6:18:44 PM By: Zandra Abts RN, BSN Entered By: Zandra Abts on 01/11/2020 14:59:58 -------------------------------------------------------------------------------- Arrival Information Details Patient Name: Date of Service: [name], [name] 01/11/2020 1:15 PM Medical Record [id number] Patient Account Number: [id number] Date of Birth/Sex: Treating RN: May 30, 1971 (48 y.o. Male) Zandra Abts Primary Care Morry Veiga: Hoy Register Other Clinician: Referring Keionna Kinnaird: Treating Naod Sweetland/Extender:Robson, Gweneth Fritter, Sherlie Ban in Treatment: 0 Visit Information Patient Arrived: Ambulatory Arrival Time: 14:45 Accompanied By: alone Transfer Assistance: None Patient Identification Verified: Yes Secondary Verification Process Completed: Yes Patient Requires Transmission-Based No Precautions: Patient Has Alerts: No Electronic Signature(s) Signed: 01/11/2020 6:18:44 PM By: Zandra Abts RN, BSN Entered By: Zandra Abts on 01/11/2020 14:45:55 -------------------------------------------------------------------------------- Clinic Level of Care Assessment Details Patient Name: Date of Service: [name], [name] 01/11/2020 1:15 PM Medical Record [id number] Patient Account Number: [id number] Date of Birth/Sex: Treating RN: 05-16-1971 (48 y.o. Male) [name] Primary Care Shivansh Hardaway: Hoy Register Other  Clinician: Referring Elery Cadenhead: Treating Erven Ramson/Extender:Robson, Gweneth Fritter, Enobong Weeks in Treatment: 0 Clinic Level of Care Assessment Items TOOL 1 Quantity Score X - Use when EandM and Procedure is performed on INITIAL visit 1 0 ASSESSMENTS - Nursing Assessment / Reassessment X - General Physical Exam (combine w/ comprehensive assessment (listed just below) 1 20 when performed on new pt. evals) X - Comprehensive Assessment (HX, ROS, Risk Assessments, Wounds Hx, etc.) 1 25 ASSESSMENTS - Wound and Skin Assessment / Reassessment []  - Dermatologic / Skin Assessment (not related to wound area) 0 ASSESSMENTS - Ostomy and/or Continence Assessment and Care []  - Incontinence Assessment and Management 0 []  - Ostomy Care Assessment and Management (repouching, etc.) 0 PROCESS - Coordination of Care X - Simple Patient / Family Education for ongoing care 1 15 []  - Complex (extensive) Patient / Family Education for ongoing care 0 X - Staff obtains , Records, Test Results / Process Orders 1 10 []  - Staff telephones HHA, Nursing Homes / Clarify orders / etc 0 []  - Routine Transfer to another Facility (non-emergent condition) 0 X - Routine Hospital Admission (non-emergent condition) 1 10 []  - New Admissions / / Ordering NPWT, Apligraf, etc. 0 []  - Emergency Hospital Admission (emergent condition) 0 PROCESS - Special Needs []  - Pediatric / Minor Patient Management 0 []  - Isolation Patient Management 0 []  - Hearing / Language / Visual special needs 0 []  - Assessment of Community assistance (transportation, D/C planning, etc.) 0 []  - Additional assistance / Altered mentation 0 []  - Support Surface(s) Assessment (bed, cushion, seat, etc.) 0 INTERVENTIONS - Miscellaneous []  - External ear exam 0 []  - Patient Transfer (multiple staff / / Similar devices) 0 []  - Simple Staple / Suture removal (25 or less) 0 []  - Complex Staple / Suture removal (26 or  more) 0 []  - Hypo/Hyperglycemic Management (do not check if billed separately) 0 X - Ankle / Brachial Index (ABI) - do not check if billed separately 1 15 Has the patient been seen at the hospital within the last three years: Yes Total Score: 95 Level Of Care: New/Established - Level 3 Electronic Signature(s) Signed: 01/11/2020 6:18:05 PM By:  Carlene Coria RN Entered By: Carlene Coria on 01/11/2020 15:50:31 -------------------------------------------------------------------------------- Encounter Discharge Information Details Patient Name: Date of Service: [name], [name] 01/11/2020 1:15 PM Medical Record [id number] Patient Account Number: [id number] Date of Birth/Sex: Treating RN: November 16, 1971 (48 y.o. Male) Kela Millin Primary Care Francia Verry: Charlott Rakes Other Clinician: Referring Sharyn Brilliant: Treating Hardin Hardenbrook/Extender:Robson, Sammuel Cooper, Douglass Rivers in Treatment: 0 Encounter Discharge Information Items Post Procedure Vitals Discharge Condition: Stable Temperature (F): 98.5 Ambulatory Status: Ambulatory Pulse (bpm): 87 Discharge Destination: Home Respiratory Rate (breaths/min): 20 Transportation: Private Auto Blood Pressure (mmHg): 125/85 Accompanied By: self Schedule Follow-up Appointment: Yes Clinical Summary of Care: Patient Declined Electronic Signature(s) Signed: 01/14/2020 5:57:08 PM By: Kela Millin Entered By: Kela Millin on 01/11/2020 17:00:20 -------------------------------------------------------------------------------- Lower Extremity Assessment Details Patient Name: Date of Service: [name], [name] 01/11/2020 1:15 PM Medical Record [id number] Patient Account Number: [id number] Date of Birth/Sex: Treating RN: 12-26-1971 (48 y.o. Male) Levan Hurst Primary Care Geneva Barrero: Charlott Rakes Other Clinician: Referring Pamila Mendibles: Treating Dellis Voght/Extender:Robson, Sammuel Cooper, Enobong Weeks in Treatment: 0 Edema  Assessment Assessed: [Left: No] [Right: No] Edema: [Left: Ye] [Right: s] Calf Left: Right: Point of Measurement: 36 cm From Medial Instep 38.5 cm cm Ankle Left: Right: Point of Measurement: 10 cm From Medial Instep 23 cm cm Vascular Assessment Pulses: Dorsalis Pedis Palpable: [Left:Yes] Blood Pressure: Brachial: [Left:125] Ankle: [Left:Dorsalis Pedis: 142 1.14] Electronic Signature(s) Signed: 01/11/2020 6:18:44 PM By: Levan Hurst RN, BSN Entered By: Levan Hurst on 01/11/2020 15:24:11 -------------------------------------------------------------------------------- Multi Wound Chart Details Patient Name: Date of Service: [name], [name] 01/11/2020 1:15 PM Medical Record [id number] Patient Account Number: [id number] Date of Birth/Sex: Treating RN: July 25, 1971 (49 y.o. 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] [2:Lower Extremity] Comorbid History: [1:Congestive Heart Failure, Congestive Heart Failure, N/A Coronary Artery Disease, Coronary Artery Disease, Hypertension, Myocardial Hypertension, Myocardial Infarction, Type II Diabetes, Infarction, Type II Diabetes, Neuropathy]  [2:Neuropathy] Date Acquired: [1:12/11/2019] [2:01/04/2020] [N/A:N/A] Weeks of Treatment: [1:0] [2:0] [N/A:N/A] Wound Status: [1:Open] [2:Open] [N/A:N/A] Measurements L x W x D 1x1x0.5 [2:0.7x0.5x0.1] [N/A:N/A] (cm) Area (cm) : [1:0.785]  [2:0.275] [N/A:N/A] Volume (cm) : [1:0.393] [2:0.027] [N/A:N/A] Starting Position 1 [1:12] (o'clock): Ending Position 1 [1:12] (o'clock): Maximum Distance 1 [1:0.4] (cm): Undermining: [1:Yes] [2:No] [N/A:N/A] Classification: [1:Grade 2] [2:Grade 2] [N/A:N/A] Exudate Amount: [1:Medium] [2:Small] [N/A:N/A] Exudate Type: [1:Serosanguineous] [2:Serosanguineous] [N/A:N/A] Exudate Color: [1:red, brown] [2:red, brown] [N/A:N/A] Wound Margin: [1:Flat and Intact] [2:Flat and Intact] [N/A:N/A] Granulation Amount: [1:Small (1-33%)] [2:Medium (34-66%)] [N/A:N/A] Granulation Quality: [1:Pink] [2:Pink] [N/A:N/A] Necrotic Amount: [1:Large (67-100%)] [2:Medium (34-66%)] [N/A:N/A] Exposed Structures: [1:Fat Layer (Subcutaneous Fat Layer (Subcutaneous N/A Tissue) Exposed: Yes Fascia: No Tendon: No Muscle: No Joint: No Bone: No] [2:Tissue) Exposed: Yes Fascia: No Tendon: No Muscle: No Joint: No Bone: No] Epithelialization: [1:None] [2:None] [N/A:N/A] Debridement: [1:Debridement - Excisional N/A] [N/A:N/A] Pre-procedure [1:15:38] [2:N/A] [N/A:N/A] Verification/Time Out Taken: Pain Control: [1:Lidocaine 5% topical ointment] [2:N/A] [N/A:N/A] Tissue Debrided: [1:Callus, Subcutaneous, Slough] [2:N/A] [N/A:N/A] Level: [1:Skin/Subcutaneous Tissue N/A] [N/A:N/A] Debridement Area (sq cm):1 [2:N/A] [N/A:N/A] Instrument: [1:Blade, Curette, Forceps] [2:N/A] [N/A:N/A] Bleeding: [1:Moderate] [2:N/A] [N/A:N/A] Hemostasis Achieved: [1:Pressure] [2:N/A] [N/A:N/A] Procedural Pain: [1:0] [2:N/A] [N/A:N/A] Post Procedural Pain: [1:0] [2:N/A] [N/A:N/A] Debridement Treatment [  1:Procedure was tolerated] [2:N/A] [N/A:N/A] Response: [1:well] Post Debridement [1:1x1x0.5] [2:N/A] [N/A:N/A] Measurements L x W x D (cm) Post Debridement [1:0.393] [2:N/A] [N/A:N/A] Volume: (cm) Procedures Performed: [1:Debridement] [2:N/A] [N/A:N/A] Treatment Notes Electronic Signature(s) Signed: 01/11/2020 6:16:53 PM By: Baltazar Najjar  MD Entered By: Baltazar Najjar on 01/11/2020 15:54:40 -------------------------------------------------------------------------------- Multi-Disciplinary Care Plan Details Patient Name: Date of Service: [name], [name] 01/11/2020 1:15 PM Medical Record [id number] Patient Account Number: [id number] Date of Birth/Sex: Treating RN: Aug 25, 1971 (49 y.o. Male) [name] Primary Care Asha Grumbine: Hoy Register Other Clinician: Referring Cecil Vandyke: Treating Felcia Huebert/Extender:Robson, Gweneth Fritter, Enobong Weeks in Treatment: 0 Active Inactive Wound/Skin Impairment Nursing Diagnoses: Knowledge deficit related to ulceration/compromised skin integrity Goals: Patient/caregiver will verbalize understanding of skin care regimen Date Initiated: 01/11/2020 Target Resolution Date: 02/04/2020 Goal Status: Active Ulcer/skin breakdown will have a volume reduction of 30% by week 4 Date Initiated: 01/11/2020 Target Resolution Date: 02/04/2020 Goal Status: Active Interventions: Assess patient/caregiver ability to obtain necessary supplies Assess patient/caregiver ability to perform ulcer/skin care regimen upon admission and as needed Assess ulceration(s) every visit Notes: Electronic Signature(s) Signed: 01/11/2020 6:18:05 PM By: [name] RN Entered By: [name] on 01/11/2020 15:38:24 -------------------------------------------------------------------------------- Pain Assessment Details Patient Name: Date of Service: [name], [name] 01/11/2020 1:15 PM Medical Record [id number] Patient Account Number: [id number] Date of Birth/Sex: Treating RN: 10-04-1971 (48 y.o. Male) Zandra Abts Primary Care Roselind Klus: Hoy Register Other Clinician: Referring Barnard Sharps: Treating Senon Nixon/Extender:Robson, Gweneth Fritter, Enobong Weeks in Treatment: 0 Active Problems Location of Pain Severity and Description of Pain Patient Has Paino No Site Locations Pain Management and  Medication Current Pain Management: Electronic Signature(s) Signed: 01/11/2020 6:18:44 PM By: Zandra Abts RN, BSN Entered By: Zandra Abts on 01/11/2020 15:24:22 -------------------------------------------------------------------------------- Patient/Caregiver Education Details Patient Name: [name] 1/12/2021andnbsp1:15 Date of Service: PM Medical Record [id number] Number: Patient Account Number: [id number] Treating RN: Date of Birth/Gender: 1971-04-13 (48 y.o. [name] Male) Other Clinician: Primary Care Physician:Newlin, Enobong Primary Care Physician:Newlin, Enobong Treating Baltazar Najjar Referring Physician: Physician/Extender: [name] in Treatment: 0 Education Assessment Education Provided To: Patient Education Topics Provided Wound/Skin Impairment: Methods: Explain/Verbal Responses: State content correctly Electronic Signature(s) Signed: 01/11/2020 6:18:05 PM By: [name] RN Entered By: [name] on 01/11/2020 15:38:54 -------------------------------------------------------------------------------- Wound Assessment Details Patient Name: Date of Service: [name], [name] 01/11/2020 1:15 PM Medical Record [id number] Patient Account Number: [id number] Date of Birth/Sex: Treating RN: Jun 02, 1971 (48 y.o. Male) Zandra Abts Primary Care Shaana Acocella: Hoy Register Other Clinician: Referring Marga Gramajo: Treating Beckie Viscardi/Extender:Robson, Gweneth Fritter, Enobong Weeks in Treatment: 0 Wound Status Wound Number: 1 Primary Diabetic Wound/Ulcer of the Lower Extremity Etiology: Wound Location: Left Metatarsal head third Wound Open Wounding Event: Trauma Status: Date Acquired: 12/11/2019 Comorbid Congestive Heart Failure, Coronary Artery Weeks Of Treatment: 0 History: Disease, Hypertension, Myocardial Infarction, Clustered Wound: No Type II Diabetes, Neuropathy Photos Wound Measurements Length: (cm) 1 Width: (cm) 1 Depth:  (cm) 0.5 Area: (cm) 0.785 Volume: (cm) 0.393 % Reduction in Area: 0% % Reduction in Volume: 0% Epithelialization: None Tunneling: No Undermining: Yes Starting Position (o'clock): 12 Ending Position (o'clock): 12 Maximum Distance: (cm) 0.4 Wound Description Classification: Grade 2 Wound Margin: Flat and Intact Exudate Amount: Medium Exudate Type: Serosanguineous Exudate Color: red, brown Wound Bed Granulation Amount: Small (1-33%) Granulation Quality: Pink Necrotic Amount: Large (67-100%) Necrotic Quality: Adherent Slough Foul Odor After Cleansing: No Slough/Fibrino Yes Exposed Structure Fascia Exposed: No Fat Layer (Subcutaneous Tissue) Exposed: Yes Tendon Exposed: No Muscle Exposed: No Joint Exposed: No Bone Exposed: No Treatment Notes Wound #1 (Left Metatarsal head  third) 1. Cleanse With Wound Cleanser 2. Periwound Care Skin Prep 3. Primary Dressing Applied Calcium Alginate Ag 4. Secondary Dressing Dry Gauze Roll Gauze 5. Secured With Tape 7. Footwear/Offloading device applied Other footwear/offloading device (specify in notes) Notes front off-loader Electronic Signature(s) Signed: 01/12/2020 3:30:11 PM By: Benjaman Kindler EMT/HBOT Signed: 01/13/2020 6:31:26 PM By: Zandra Abts RN, BSN Previous Signature: 01/11/2020 6:18:44 PM Version By: Zandra Abts RN, BSN Entered By: Benjaman Kindler on 01/12/2020 14:47:04 -------------------------------------------------------------------------------- Wound Assessment Details Patient Name: Date of Service: [name], [name] 01/11/2020 1:15 PM Medical Record [id number] Patient Account Number: [id number] Date of Birth/Sex: Treating RN: 11-Apr-1971 (48 y.o. Male) Zandra Abts Primary Care Jonie Burdell: Other Clinician: Hoy Register Referring Anushree Dorsi: Treating Kaiya Boatman/Extender:Robson, Gweneth Fritter, Enobong Weeks in Treatment: 0 Wound Status Wound Number: 2 Primary Diabetic Wound/Ulcer of the Lower  Extremity Etiology: Wound Location: Left Metatarsal head fifth Wound Open Wounding Event: Trauma Status: Date Acquired: 01/04/2020 Comorbid Congestive Heart Failure, Coronary Artery Weeks Of Treatment: 0 History: Disease, Hypertension, Myocardial Infarction, Clustered Wound: No Type II Diabetes, Neuropathy Photos Wound Measurements Length: (cm) 0.7 Width: (cm) 0.5 Depth: (cm) 0.1 Area: (cm) 0.275 Volume: (cm) 0.027 Wound Description Classification: Grade 2 Wound Margin: Flat and Intact Exudate Amount: Small Exudate Type: Serosanguineous Exudate Color: red, brown Wound Bed Granulation Amount: Medium (34-66%) Granulation Quality: Pink Necrotic Amount: Medium (34-66%) Necrotic Quality: Adherent Slough After Cleansing: No rino Yes Exposed Structure osed: No (Subcutaneous Tissue) Exposed: Yes osed: No osed: No sed: No ed: No % Reduction in Area: 0% % Reduction in Volume: 0% Epithelialization: None Tunneling: No Undermining: No Foul Odor Slough/Fib Fascia Exp Fat Layer Tendon Exp Muscle Exp Joint Expo Bone Expos Treatment Notes Wound #2 (Left Metatarsal head fifth) 1. Cleanse With Wound Cleanser 2. Periwound Care Skin Prep 3. Primary Dressing Applied Calcium Alginate Ag 4. Secondary Dressing Dry Gauze Roll Gauze 5. Secured With Tape 7. Footwear/Offloading device applied Other footwear/offloading device (specify in notes) Notes front off-loader Electronic Signature(s) Signed: 01/12/2020 3:30:11 PM By: Benjaman Kindler EMT/HBOT Signed: 01/13/2020 6:31:26 PM By: Zandra Abts RN, BSN Previous Signature: 01/11/2020 6:18:44 PM Version By: Zandra Abts RN, BSN Entered By: Benjaman Kindler on 01/12/2020 14:47:27 -------------------------------------------------------------------------------- Vitals Details Patient Name: Date of Service: [name], [name] 01/11/2020 1:15 PM Medical Record [id number] Patient Account Number: [id number] Date of  Birth/Sex: Treating RN: [birth year] (48 y.o. Male) Zandra Abts Primary Care Kimberli Winne: Hoy Register Other Clinician: Referring Wallis Spizzirri: Treating Camey Edell/Extender:Robson, Gweneth Fritter, Enobong Weeks in Treatment: 0 Vital Signs Time Taken: 14:46 Temperature (F): 98.5 Height (in): 78 Pulse (bpm): 87 Source: Stated Respiratory Rate (breaths/min): 20 Weight (lbs): 285 Blood Pressure (mmHg): 125/85 Source: Stated Reference Range: 80 - 120 mg / dl Body Mass Index (BMI): 32.9 Electronic Signature(s) Signed: 01/11/2020 6:18:44 PM By: Zandra Abts RN, BSN Entered By: Zandra Abts on 01/11/2020 14:49:15

## 2020-01-18 ENCOUNTER — Encounter (HOSPITAL_BASED_OUTPATIENT_CLINIC_OR_DEPARTMENT_OTHER): Payer: Self-pay | Attending: Internal Medicine | Admitting: Internal Medicine

## 2020-01-18 ENCOUNTER — Other Ambulatory Visit: Payer: Self-pay

## 2020-01-18 ENCOUNTER — Ambulatory Visit
Admission: RE | Admit: 2020-01-18 | Discharge: 2020-01-18 | Disposition: A | Discharge status: Home / Self Care | Payer: Self-pay | Source: Ambulatory Visit | Attending: Internal Medicine | Admitting: Internal Medicine

## 2020-01-18 ENCOUNTER — Other Ambulatory Visit: Payer: Self-pay | Admitting: Internal Medicine

## 2020-01-18 DIAGNOSIS — E11621 Type 2 diabetes mellitus with foot ulcer: Secondary | ICD-10-CM

## 2020-01-18 DIAGNOSIS — L97509 Non-pressure chronic ulcer of other part of unspecified foot with unspecified severity: Secondary | ICD-10-CM

## 2020-01-18 MED FILL — AMOX-CLAV 875-125 MG TABLET: 875-125 | 7 days supply | Qty: 14 | Fill #0

## 2020-01-19 NOTE — Progress Notes (Signed)
JAIDEEP, POLLACK ([id number]) Visit Report for 01/18/2020 Debridement Details Patient Name: Date of Service: MILFORD, CILENTO 01/18/2020 9:00 AM Medical Record [id number] Patient Account Number: [id number] Date of Birth/Sex: [birth year] (49 y.o. M) Treating RN: Primary Care Provider: Charlott Rakes Other Clinician: Referring Provider: Treating Provider/Extender:Mccall Will, Sammuel Cooper, Enobong Weeks in Treatment: 1 Debridement Performed for Wound #1 Left Metatarsal head third Assessment: Performed By: Physician Ricard Dillon., MD Debridement Type: Debridement Severity of Tissue Pre Fat layer exposed Debridement: Level of Consciousness (Pre- Awake and Alert procedure): Pre-procedure Verification/Time Out Taken: Yes - 09:30 Start Time: 09:30 Pain Control: Lidocaine 5% topical ointment Total Area Debrided (L x W): 1 (cm) x 0.8 (cm) = 0.8 (cm) Tissue and other material Viable, Non-Viable, Callus, Slough, Subcutaneous, Slough debrided: Level: Skin/Subcutaneous Tissue Debridement Description: Excisional Instrument: Curette Bleeding: Moderate Hemostasis Achieved: Pressure End Time: 09:35 Procedural Pain: 0 Post Procedural Pain: 0 Response to Treatment: Procedure was tolerated well Level of Consciousness Awake and Alert (Post-procedure): Post Debridement Measurements of Total Wound Length: (cm) 1 Width: (cm) 0.8 Depth: (cm) 0.3 Volume: (cm) 0.188 Character of Wound/Ulcer Post Improved Debridement: Severity of Tissue Post Debridement: Fat layer exposed Post Procedure Diagnosis Same as Pre-procedure Electronic Signature(s) Signed: 01/18/2020 5:52:07 PM By: Linton Ham MD Entered By: Linton Ham on 01/18/2020 09:37:43 -------------------------------------------------------------------------------- Debridement Details Patient Name: Date of Service: GAELAN, GLENNON 01/18/2020 9:00 AM Medical Record [id number] Patient Account Number: [id number] Date  of Birth/Sex: 01-Jul-1971 (49 y.o. M) Treating RN: Primary Care Provider: Charlott Rakes Other Clinician: Referring Provider: Treating Provider/Extender:Deema Juncaj, Sammuel Cooper, Enobong Weeks in Treatment: 1 Debridement Performed for Wound #2 Left Metatarsal head fifth Assessment: Performed By: Physician Ricard Dillon., MD Debridement Type: Debridement Severity of Tissue Pre Fat layer exposed Debridement: Level of Consciousness (Pre- Awake and Alert procedure): Pre-procedure Yes - 09:30 Verification/Time Out Taken: Start Time: 09:30 Pain Control: Lidocaine 5% topical ointment Total Area Debrided (L x W): 0.3 (cm) x 0.2 (cm) = 0.06 (cm) Tissue and other material Viable, Non-Viable, Callus, Slough, Subcutaneous, Slough debrided: Level: Skin/Subcutaneous Tissue Debridement Description: Excisional Instrument: Curette Bleeding: Moderate Hemostasis Achieved: Pressure End Time: 09:35 Procedural Pain: 0 Post Procedural Pain: 0 Response to Treatment: Procedure was tolerated well Level of Consciousness Awake and Alert (Post-procedure): Post Debridement Measurements of Total Wound Length: (cm) 0.3 Width: (cm) 0.2 Depth: (cm) 0.2 Volume: (cm) 0.009 Character of Wound/Ulcer Post Improved Debridement: Severity of Tissue Post Debridement: Fat layer exposed Post Procedure Diagnosis Same as Pre-procedure Electronic Signature(s) Signed: 01/18/2020 5:52:07 PM By: Linton Ham MD Entered By: Linton Ham on 01/18/2020 09:37:52 -------------------------------------------------------------------------------- HPI Details Patient Name: Date of Service: ELISA, SORLIE 01/18/2020 9:00 AM Medical Record [id number] Patient Account Number: [id number] Date of Birth/Sex: Treating RN: 01-27-1971 (49 y.o. M) Primary Care Provider: Charlott Rakes Other Clinician: Referring Provider: Treating Provider/Extender:Dicie Edelen, Sammuel Cooper, Charlane Ferretti Weeks in Treatment:  1 History of Present Illness HPI Description: ADMISSION 01/11/2020 This is a 49 year old man with uncontrolled type 2 diabetes with a recent hemoglobin A1c earlier this year of 13.4. He is on insulin and glipizide. He does not take his blood sugars at home. He does have a follow-up with primary care later this month I believe on January 27. He tells [place] that roughly a month ago he was walking with a shoe with a hole in his foot. He took the shoe off and there was an open wound at roughly the left fourth met head. This has significant undermining and raised edges. He has not noticed any purulence he does not  feel unwell. More recently he was taking skin off the bottom of his foot and has a superficial area on the left fifth met head. He has not been offloading this. The patient was in the ER on 12/20. They gave him Bactroban which she has been using on the wound and 10 days worth of doxycycline. No x-rays were done. He has not had vascular studies. He is also been using hydrogen peroxide. Past medical history type 2 diabetes uncontrolled, chronic systolic heart failure, coronary artery disease with a history of congestive heart failure with stents. Hypertension hyperlipidemia and chronic renal insufficiency ABI in this clinic was 1.14 on the left. 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 He is uncertain whether he would be able to work if we put him on some form of restriction 1/19; he is generally doing quite well. Using silver alginate on the wounds. Things actually look better. He has a forefoot offloading boot which she seems to be compliant about. He has support at work to stay off his foot is much as possible which is gratifying. Culture I did last week showed a few Enterococcus faecalis. I am going to put him on Augmentin. I talked about ordering an x-ray in my note last week but that does not seem to have happened. We will review reorder the x-ray this  week. Electronic Signature(s) Signed: 01/18/2020 5:52:07 PM By: Linton Ham MD Entered By: Linton Ham on 01/18/2020 09:39:21 -------------------------------------------------------------------------------- Physical Exam Details Patient Name: Date of Service: ROY, TOKARZ 01/18/2020 9:00 AM Medical Record [id number] Patient Account Number: [id number] Date of Birth/Sex: Treating RN: 19-Dec-1971 (49 y.o. M) Primary Care Provider: Charlott Rakes Other Clinician: Referring Provider: Treating Provider/Extender:Neesa Knapik, Sammuel Cooper, Charlane Ferretti Weeks in Treatment: 1 Constitutional Patient is hypertensive.. Pulse regular and within target range for patient.[name] Respirations regular, non-labored and within target range.. Temperature is normal and within the target range for the patient.[name] Appears in no distress. Notes Wound exam The large area over the mid part of his metatarsal heads 3rd-4th seems a lot better than last week where he required extensive debridement for overhanging tissue. Still debridement with a #5 curette looking at senescent rolled edges of callus and subcutaneous tissue from around the margins and surface debris. Hemostasis with direct pressure The fifth met head is down to a small open area also required debridement of callus skin and subcutaneous tissue. Electronic Signature(s) Signed: 01/18/2020 5:52:07 PM By: Linton Ham MD Entered By: Linton Ham on 01/18/2020 09:40:51 -------------------------------------------------------------------------------- Physician Orders Details Patient Name: Date of Service: KIMI, KROFT 01/18/2020 9:00 AM Medical Record [id number] Patient Account Number: [id number] Date of Birth/Sex: Treating RN: 1971/01/04 (48 y.o. [name]) Carlene Coria Primary Care Provider: Charlott Rakes Other Clinician: Referring Provider: Treating Provider/Extender:Vasco Chong, Sammuel Cooper, Douglass Rivers in Treatment: 1 Verbal / Phone  Orders: No Diagnosis Coding ICD-10 Coding Code Description E11.621 Type 2 diabetes mellitus with foot ulcer L97.522 Non-pressure chronic ulcer of other part of left foot with fat layer exposed L97.521 Non-pressure chronic ulcer of other part of left foot limited to breakdown of skin E11.42 Type 2 diabetes mellitus with diabetic polyneuropathy Follow-up Appointments Return Appointment in 1 week. Dressing Change Frequency Change dressing every day. Wound Cleansing May shower and wash wound with soap and water. Primary Wound Dressing Calcium Alginate with Silver Secondary Dressing Kerlix/Rolled Gauze - secure with tape Dry Gauze Off-Loading Other: - front off loading shoe Radiology X-ray, foot, left - non healing wound plantar foot - (ICD10 E11.621 -  Type 2 diabetes mellitus with foot ulcer) Electronic Signature(s) Signed: 01/18/2020 5:52:07 PM By: Linton Ham MD Signed: 01/19/2020 5:42:11 PM By: Carlene Coria RN Entered By: Carlene Coria on 01/18/2020 09:35:33 -------------------------------------------------------------------------------- Prescription 01/18/2020 Patient Name: [name] Provider: Linton Ham MD Date of Birth: 06-23-1971 NPI#: [id number] Sex: M DEA#: [id number] Phone #: [phone] License #: [id number] Patient Address: [facility] [address] [place], [place] [address] [place], [place] [address] [phone] Allergies No Known Drug Allergies Provider's Orders X-ray, foot, left - ICD10: E11.621 - non healing wound plantar foot Signature(s): Date(s): Electronic Signature(s) Signed: 01/18/2020 5:52:07 PM By: Linton Ham MD Signed: 01/19/2020 5:42:11 PM By: Carlene Coria RN Entered By: Carlene Coria on 01/18/2020 09:35:34 --------------------------------------------------------------------------------  Problem List Details Patient Name: Date of Service: LEMON, STERNBERG 01/18/2020  9:00 AM Medical Record [id number] Patient Account Number: [id number] Date of Birth/Sex: Treating RN: 1971-06-11 (48 y.o. [name] Primary Care Provider: Charlott Rakes Other Clinician: Referring Provider: Treating Provider/Extender:Issiac Jamar, Sammuel Cooper, Enobong Weeks in Treatment: 1 Active Problems ICD-10 Evaluated Encounter Code Description Active Date Today Diagnosis E11.621 Type 2 diabetes mellitus with foot ulcer 01/11/2020 No Yes L97.522 Non-pressure chronic ulcer of other part of left foot 01/11/2020 No Yes with fat layer exposed L97.521 Non-pressure chronic ulcer of other part of left foot 01/11/2020 No Yes limited to breakdown of skin E11.42 Type 2 diabetes mellitus with diabetic polyneuropathy 01/11/2020 No Yes Inactive Problems Resolved Problems Electronic Signature(s) Signed: 01/18/2020 5:52:07 PM By: Linton Ham MD Entered By: Linton Ham on 01/18/2020 09:37:07 -------------------------------------------------------------------------------- Progress Note Details Patient Name: Date of Service: JERRAD, MENDIBLES 01/18/2020 9:00 AM Medical Record [id number] Patient Account Number: [id number] Date of Birth/Sex: Treating RN: Dec 04, 1971 (49 y.o. M) Primary Care Provider: Charlott Rakes Other Clinician: Referring Provider: Treating Provider/Extender:Bonnie Overdorf, Sammuel Cooper, Charlane Ferretti Weeks in Treatment: 1 Subjective History of Present Illness (HPI) ADMISSION 01/11/2020 This is a 49 year old man with uncontrolled type 2 diabetes with a recent hemoglobin A1c earlier this year of 13.4. He is on insulin and glipizide. He does not take his blood sugars at home. He does have a follow-up with primary care later this month I believe on January 27. He tells [place] that roughly a month ago he was walking with a shoe with a hole in his foot. He took the shoe off and there was an open wound at roughly the left fourth met head. This has significant undermining and  raised edges. He has not noticed any purulence he does not feel unwell. More recently he was taking skin off the bottom of his foot and has a superficial area on the left fifth met head. He has not been offloading this. The patient was in the ER on 12/20. They gave him Bactroban which she has been using on the wound and 10 days worth of doxycycline. No x-rays were done. He has not had vascular studies. He is also been using hydrogen peroxide. Past medical history type 2 diabetes uncontrolled, chronic systolic heart failure, coronary artery disease with a history of congestive heart failure with stents. Hypertension hyperlipidemia and chronic renal insufficiency ABI in this clinic was 1.14 on the left. 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 He is uncertain whether he would be able to work if we put him on some form of restriction 1/19; he is generally doing quite well. Using silver alginate on the wounds. Things actually look better. He has a  forefoot offloading boot which she seems to be compliant about. He has support at work to stay off his foot is much as possible which is gratifying. Culture I did last week showed a few Enterococcus faecalis. I am going to put him on Augmentin. I talked about ordering an x-ray in my note last week but that does not seem to have happened. We will review reorder the x-ray this week. Objective Constitutional Patient is hypertensive.. Pulse regular and within target range for patient.[name] Respirations regular, non-labored and within target range.. Temperature is normal and within the target range for the patient.[name] Appears in no distress. Vitals Time Taken: 8:46 AM, Height: 78 in, Weight: 285 lbs, BMI: 32.9, Temperature: 97.8 F, Pulse: 84 bpm, Respiratory Rate: 18 breaths/min, Blood Pressure: 130/97 mmHg. General Notes: Wound exam ooThe large area over the mid part of his metatarsal heads 3rd-4th seems a lot better than last week  where he required extensive debridement for overhanging tissue. Still debridement with a #5 curette looking at senescent rolled edges of callus and subcutaneous tissue from around the margins and surface debris. Hemostasis with direct pressure ooThe fifth met head is down to a small open area also required debridement of callus skin and subcutaneous tissue. Integumentary (Hair, Skin) Wound #1 status is Open. Original cause of wound was Trauma. The wound is located on the Left Metatarsal head third. The wound measures 1cm length x 0.8cm width x 0.3cm depth; 0.628cm^2 area and 0.188cm^3 volume. There is Fat Layer (Subcutaneous Tissue) Exposed exposed. There is no tunneling or undermining noted. There is a medium amount of serosanguineous drainage noted. The wound margin is flat and intact. There is medium (34-66%) pink granulation within the wound bed. There is a medium (34-66%) amount of necrotic tissue within the wound bed including Adherent Slough. Wound #2 status is Open. Original cause of wound was Trauma. The wound is located on the Left Metatarsal head fifth. The wound measures 0.3cm length x 0.2cm width x 0.2cm depth; 0.047cm^2 area and 0.009cm^3 volume. There is Fat Layer (Subcutaneous Tissue) Exposed exposed. There is no tunneling or undermining noted. There is a small amount of serosanguineous drainage noted. The wound margin is flat and intact. There is large (67-100%) pink granulation within the wound bed. There is no necrotic tissue within the wound bed. 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 There was a Excisional Skin/Subcutaneous Tissue Debridement with a total area of 0.8 sq cm performed by Ricard Dillon., MD. With the following instrument(s): Curette to remove Viable and Non-Viable tissue/material. Material removed includes Callus, Subcutaneous Tissue, and Slough after achieving pain control using Lidocaine 5% topical ointment. No specimens were taken. A time out was conducted at 09:30, prior to the start of the procedure. A Moderate amount of bleeding was controlled with Pressure. The procedure was tolerated well with a pain level of 0 throughout and a pain level of 0 following the procedure. Post Debridement Measurements: 1cm length x 0.8cm width x 0.3cm depth; 0.188cm^3 volume. Character of Wound/Ulcer Post Debridement is improved. Severity of Tissue Post Debridement is: Fat layer  exposed. Post procedure Diagnosis Wound #1: Same as Pre-Procedure Wound #2 Pre-procedure diagnosis of Wound #2 is a Diabetic Wound/Ulcer of the Lower Extremity located on the Left Metatarsal head fifth .Severity of Tissue Pre Debridement is: Fat layer exposed. There was a Excisional Skin/Subcutaneous Tissue Debridement with a total area of 0.06 sq cm performed by Ricard Dillon., MD. With the following instrument(s): Curette to remove Viable and Non-Viable tissue/material. Material removed includes Callus, Subcutaneous Tissue, and Slough after achieving pain control using Lidocaine 5% topical ointment. No specimens were taken. A time out was conducted at 09:30, prior to the start of the procedure. A Moderate amount of bleeding was controlled with Pressure. The procedure was tolerated well with a pain level of 0 throughout and a pain level of 0 following the procedure. Post Debridement Measurements: 0.3cm length x 0.2cm width x 0.2cm depth; 0.009cm^3 volume. Character of Wound/Ulcer Post Debridement is improved. Severity of  Tissue Post Debridement is: Fat layer exposed. Post procedure Diagnosis Wound #2: Same as Pre-Procedure Plan Follow-up Appointments: Return Appointment in 1 week. Dressing Change Frequency: Change dressing every day. Wound Cleansing: May shower and wash wound with soap and water. Primary Wound Dressing: Calcium Alginate with Silver Secondary Dressing: Kerlix/Rolled Gauze - secure with tape Dry Gauze Off-Loading: Other: - front off loading shoe Radiology ordered were: X-ray, foot, left - non healing wound plantar foot 1. In general everything looks a bit better here. 2. Augmentin 875 1 p.o. twice daily for 7 days for the Enterococcus that was grown last week 3. X-ray of the left foot looking at the underlying bony structures 4. It looks as though he is being compliant with his forefoot offloading boot. Once again I did talk about a total contact cast. We will make a decision about this in the next 2 weeks or so Electronic Signature(s) Signed: 01/18/2020 5:52:07 PM By: Linton Ham MD Entered By: Linton Ham on 01/18/2020 09:44:13 -------------------------------------------------------------------------------- SuperBill Details Patient Name: Date of Service: SLY, PARLEE 01/18/2020 Medical Record [id number] Patient Account Number: [id number] Date of Birth/Sex: Treating RN: July 24, 1971 (49 y.o. M) Primary Care Provider: Charlott Rakes Other Clinician: Referring Provider: Treating Provider/Extender:Lela Gell, Sammuel Cooper, Charlane Ferretti Weeks in Treatment: 1 Diagnosis Coding ICD-10 Codes Code Description E11.621 Type 2 diabetes mellitus with foot ulcer L97.522 Non-pressure chronic ulcer of other part of left foot with fat layer exposed L97.521 Non-pressure chronic ulcer of other part of left foot limited to breakdown of skin E11.42 Type 2 diabetes mellitus with diabetic polyneuropathy Facility Procedures CPT4 Code Description: [id number] - DEB SUBQ TISSUE 20 SQ  CM/< ICD-10 Diagnosis Description L97.522 Non-pressure chronic ulcer of other part of left foot wit L97.521 Non-pressure chronic ulcer of other part of left foot lim E11.621 Type 2 diabetes  mellitus with foot ulcer Modifier: h fat layer exp ited to breakdo Quantity: 1 osed wn of skin Physician Procedures CPT4 Code Description: [id number] - WC PHYS SUBQ TISS 20 SQ CM ICD-10 Diagnosis Description L97.522 Non-pressure chronic ulcer of other part of left foot wit L97.521 Non-pressure chronic ulcer of other part of left foot lim E11.621 Type 2 diabetes  mellitus with foot ulcer Modifier: h fat layer exp ited to breakdo Quantity: 1 osed wn of skin Electronic Signature(s) Signed: 01/18/2020 5:52:07 PM By: Linton Ham MD Entered By: Linton Ham on 01/18/2020 09:44:54

## 2020-01-24 NOTE — Progress Notes (Signed)
Glenn, [name] ([id number]) Visit Report for 01/18/2020 Arrival Information Details Patient Name: Date of Service: [name], [name] 01/18/2020 9:00 AM Medical Record [id number] Patient Account Number: [id number] Date of Birth/Sex: Treating RN: May 10, 1971 (49 y.o. Janyth Contes Primary Care Solangel Mcmanaway: Charlott Rakes Other Clinician: Referring Walter Grima: Treating Ravan Schlemmer/Extender:Robson, Sammuel Cooper, Douglass Rivers in Treatment: 1 Visit Information History Since Last Visit Added or deleted any medications: No Patient Arrived: Ambulatory Any new allergies or adverse reactions: No Arrival Time: 08:44 Had a fall or experienced change in No Accompanied By: alone activities of daily living that may affect Transfer Assistance: None risk of falls: Patient Identification Verified: Yes Signs or symptoms of abuse/neglect since No Secondary Verification Process Yes last visito Completed: Hospitalized since last visit: No Patient Requires Transmission-Based No Implantable device outside of the clinic No Precautions: excluding Patient Has Alerts: No cellular tissue based products placed in the center since last visit: Has Dressing in Place as Prescribed: Yes Has Footwear/Offloading in Place as Yes Prescribed: Left: Wedge Shoe Pain Present Now: No Electronic Signature(s) Signed: 01/24/2020 6:46:58 PM By: Levan Hurst RN, BSN Entered By: Levan Hurst on 01/18/2020 08:44:52 -------------------------------------------------------------------------------- Encounter Discharge Information Details Patient Name: Date of Service: [name], [name] 01/18/2020 9:00 AM Medical Record [id number] Patient Account Number: [id number] Date of Birth/Sex: Treating RN: [birth year] (49 y.o. Janyth Contes Primary Care Mychelle Kendra: Charlott Rakes Other Clinician: Referring Yides Saidi: Treating Skylarr Liz/Extender:Robson, Sammuel Cooper, Douglass Rivers in Treatment: 1 Encounter  Discharge Information Items Post Procedure Vitals Discharge Condition: Stable Temperature (F): 97.8 Ambulatory Status: Ambulatory Pulse (bpm): 84 Discharge Destination: Home Respiratory Rate (breaths/min): 18 Transportation: Private Auto Blood Pressure (mmHg): 130/97 Accompanied By: alone Schedule Follow-up Appointment: Yes Clinical Summary of Care: Patient Declined Electronic Signature(s) Signed: 01/24/2020 6:46:58 PM By: Levan Hurst RN, BSN Entered By: Levan Hurst on 01/18/2020 10:04:08 -------------------------------------------------------------------------------- Lower Extremity Assessment Details Patient Name: Date of Service: [name], [name] 01/18/2020 9:00 AM Medical Record [id number] Patient Account Number: [id number] Date of Birth/Sex: Treating RN: June 23, 1971 (49 y.o. Janyth Contes Primary Care Dinita Migliaccio: Charlott Rakes Other Clinician: Referring Melea Prezioso: Treating [name] Baltimore/Extender:Robson, Sammuel Cooper, Enobong Weeks in Treatment: 1 Edema Assessment Assessed: [Left: No] [Right: No] Edema: [Left: N] [Right: o] Calf Left: Right: Point of Measurement: 36 cm From Medial Instep 38.5 cm cm Ankle Left: Right: Point of Measurement: 10 cm From Medial Instep 23 cm cm Vascular Assessment Pulses: Dorsalis Pedis Palpable: [Left:Yes] Electronic Signature(s) Signed: 01/24/2020 6:46:58 PM By: Levan Hurst RN, BSN Entered By: Levan Hurst on 01/18/2020 08:55:44 -------------------------------------------------------------------------------- Multi Wound Chart Details Patient Name: Date of Service: Glenn, [name] 01/18/2020 9:00 AM Medical Record [id number] Patient Account Number: [id number] Date of Birth/Sex: Treating RN: 12-22-1971 (49 y.o. 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] [2:Lower Extremity] Comorbid History: [1:Congestive Heart Failure, Congestive Heart Failure, N/A Coronary Artery Disease, Coronary Artery Disease, Hypertension, Myocardial Hypertension, Myocardial Infarction, Type II Diabetes, Infarction, Type II Diabetes, Neuropathy]  [2:Neuropathy] Date Acquired: [1:12/11/2019] [2:01/04/2020] [N/A:N/A] Weeks of Treatment: [1:1] [2:1] [N/A:N/A] Wound Status: [1:Open] [2:Open] [N/A:N/A] Measurements L x W x D 1x0.8x0.3 [2:0.3x0.2x0.2] [N/A:N/A] (cm) Area (cm) : [1:0.628] [2:0.047] [N/A:N/A] Volume (cm) : [1:0.188] [  2:0.009] [N/A:N/A] % Reduction in Area: [1:20.00%] [2:82.90%] [N/A:N/A] % Reduction in Volume: 52.20% [2:66.70%] [N/A:N/A] Classification: [1:Grade 2] [2:Grade 2] [N/A:N/A] Exudate Amount: [1:Medium] [2:Small] [N/A:N/A] Exudate Type: [1:Serosanguineous] [2:Serosanguineous] [N/A:N/A] Exudate Color: [1:red, brown] [2:red, brown] [N/A:N/A] Wound Margin: [1:Flat and Intact] [2:Flat and Intact] [N/A:N/A] Granulation Amount: [1:Medium (34-66%)] [2:Large (67-100%)] [N/A:N/A] Granulation Quality: [1:Pink] [2:Pink] [N/A:N/A] Necrotic Amount: [1:Medium (34-66%)] [2:None Present (0%)] [N/A:N/A] Exposed Structures: [1:Fat Layer (Subcutaneous Fat Layer (Subcutaneous N/A Tissue) Exposed: Yes Fascia: No Tendon: No Muscle: No Joint: No Bone: No] [2:Tissue) Exposed: Yes Fascia: No Tendon: No Muscle: No Joint: No Bone: No] Epithelialization: [1:Small (1-33%)] [2:Small (1-33%)] [N/A:N/A] Debridement: [1:Debridement - Excisional Debridement - Excisional  N/A] Pre-procedure [1:09:30] [2:09:30] [N/A:N/A] Verification/Time Out Taken: Pain Control: [1:Lidocaine 5% topical ointment] [2:Lidocaine 5% topical ointment] [N/A:N/A] Tissue Debrided: [1:Callus, Subcutaneous, Slough] [2:Callus, Subcutaneous, Slough] [N/A:N/A] Level: [1:Skin/Subcutaneous Tissue] [2:Skin/Subcutaneous Tissue] [N/A:N/A] Debridement Area (sq cm):0.8 [2:0.06] [N/A:N/A] Instrument: [1:Curette] [2:Curette] [N/A:N/A] Bleeding: [1:Moderate] [2:Moderate] [N/A:N/A] Hemostasis Achieved: [1:Pressure] [2:Pressure] [N/A:N/A] Procedural Pain: [1:0] [2:0] [N/A:N/A] Post Procedural Pain: [1:0] [2:0] [N/A:N/A] Debridement Treatment Procedure was tolerated [2:Procedure was tolerated] [N/A:N/A] Response: [1:well] [2:well] Post Debridement [1:1x0.8x0.3] [2:0.3x0.2x0.2] [N/A:N/A] Measurements L x W x D (cm) Post Debridement [1:0.188] [2:0.009] [N/A:N/A] Volume: (cm) Procedures Performed: Debridement [2:Debridement] [N/A:N/A] Treatment Notes Electronic Signature(s) Signed: 01/18/2020 5:52:07 PM By: Baltazar Najjar MD Entered By: Baltazar Najjar on 01/18/2020 09:37:16 -------------------------------------------------------------------------------- Multi-Disciplinary Care Plan Details Patient Name: Date of Service: [name], [name] 01/18/2020 9:00 AM Medical Record [id number] Patient Account Number: [id number] Date of Birth/Sex: Treating RN: [birth year] (48 y.o. [name] Primary Care Gelila Well: Hoy Register Other Clinician: Referring Placida Cambre: Treating Knight Oelkers/Extender:Robson, Gweneth Fritter, Enobong Weeks in Treatment: 1 Active Inactive Wound/Skin Impairment Nursing Diagnoses: Knowledge deficit related to ulceration/compromised skin integrity Goals: Patient/caregiver will verbalize understanding of skin care regimen Date Initiated: 01/11/2020 Target Resolution Date: 02/04/2020 Goal Status: Active Ulcer/skin breakdown will have a volume reduction of 30% by week  4 Date Initiated: 01/11/2020 Target Resolution Date: 02/04/2020 Goal Status: Active Interventions: Assess patient/caregiver ability to obtain necessary supplies Assess patient/caregiver ability to perform ulcer/skin care regimen upon admission and as needed Assess ulceration(s) every visit Notes: Electronic Signature(s) Signed: 01/19/2020 5:42:11 PM By: [name] RN Entered By: [name] on 01/18/2020 08:42:40 -------------------------------------------------------------------------------- Pain Assessment Details Patient Name: Date of Service: [name], [name] 01/18/2020 9:00 AM Medical Record [id number] Patient Account Number: [id number] Date of Birth/Sex: Treating RN: 04/05/1971 (49 y.o. [name] Primary Care Yerick Eggebrecht: Hoy Register Other Clinician: Referring Chanique Duca: Treating Verdie Wilms/Extender:Robson, Gweneth Fritter, Enobong Weeks in Treatment: 1 Active Problems Location of Pain Severity and Description of Pain Patient Has Paino No Site Locations Pain Management and Medication Current Pain Management: Electronic Signature(s) Signed: 01/24/2020 6:46:58 PM By: Zandra Abts RN, BSN Entered By: Zandra Abts on 01/18/2020 08:47:49 -------------------------------------------------------------------------------- Patient/Caregiver Education Details Patient Name: Date of Service: [name], [name] 1/19/2021andnbsp9:00 AM Medical Record [phone] Patient Account Number: [id number] Date of Birth/Gender: February 19, 1971 (48 y.o. M) Treating RN: [name] Primary Care Physician: Hoy Register Other Clinician: Referring Physician: Treating Physician/Extender:Robson, Gweneth Fritter, Sherlie Ban in Treatment: 1 Education Assessment Education Provided To: Patient Education Topics Provided Wound/Skin Impairment: Methods: Explain/Verbal Responses: State content correctly Electronic Signature(s) Signed: 01/19/2020 5:42:11 PM By: [name]  RN Entered By: [name] on 01/18/2020 08:42:56 -------------------------------------------------------------------------------- Wound Assessment Details Patient Name: Date of Service: [name], [name] 01/18/2020 9:00 AM Medical Record [id number] Patient Account Number: [id number] Date of Birth/Sex: Treating RN: [birth year] (48 y.o. [name] Primary Care Tavarius Grewe: Hoy Register Other Clinician: Referring  Arielys Wandersee: Treating Nahal Wanless/Extender:Robson, Gweneth Fritter, Enobong Weeks in Treatment: 1 Wound Status Wound Number: 1 Primary Diabetic Wound/Ulcer of the Lower Extremity Etiology: Wound Location: Left Metatarsal head third Wound Open Wounding Event: Trauma Status: Date Acquired: 12/11/2019 Comorbid Congestive Heart Failure, Coronary Artery Weeks Of Treatment: 1 History: Disease, Hypertension, Myocardial Infarction, Clustered Wound: No Type II Diabetes, Neuropathy Photos Wound Measurements Length: (cm) 1 Width: (cm) 0.8 Depth: (cm) 0.3 Area: (cm) 0.628 Volume: (cm) 0.188 Wound Description Classification: Grade 2 Wound Margin: Flat and Intact Exudate Amount: Medium Exudate Type: Serosanguineous Exudate Color: red, brown Wound Bed Granulation Amount: Medium (34-66%) Granulation Quality: Pink Necrotic Amount: Medium (34-66%) Necrotic Quality: Adherent Slough fter Cleansing: No ino Yes Exposed Structure ed: No ubcutaneous Tissue) Exposed: Yes ed: No ed: No d: No : No % Reduction in Area: 20% % Reduction in Volume: 52.2% Epithelialization: Small (1-33%) Tunneling: No Undermining: No Foul Odor A Slough/Fibr Fascia Expos Fat Layer (S Tendon Expos Muscle Expos Joint Expose Bone Exposed Treatment Notes Wound #1 (Left Metatarsal head third) 1. Cleanse With Wound Cleanser 3. Primary Dressing Applied Calcium Alginate Ag 4. Secondary Dressing Dry Gauze Roll Gauze 5. Secured With Secretary/administrator) Signed: 01/18/2020  4:37:08 PM By: Benjaman Kindler EMT/HBOT Signed: 01/24/2020 6:46:58 PM By: Zandra Abts RN, BSN Entered By: Benjaman Kindler on 01/18/2020 15:00:31 -------------------------------------------------------------------------------- Wound Assessment Details Patient Name: Date of Service: [name], [name] 01/18/2020 9:00 AM Medical Record [id number] Patient Account Number: [id number] Date of Birth/Sex: Treating RN: 23-Oct-1971 (49 y.o. [name] Primary Care Raynah Gomes: Hoy Register Other Clinician: Referring Kaleel Schmieder: Treating Janit Cutter/Extender:Robson, Gweneth Fritter, Enobong Weeks in Treatment: 1 Wound Status Wound Number: 2 Primary Diabetic Wound/Ulcer of the Lower Extremity Etiology: Wound Location: Left Metatarsal head fifth Wound Open Wounding Event: Trauma Status: Date Acquired: 01/04/2020 Comorbid Congestive Heart Failure, Coronary Artery Weeks Of Treatment: 1 History: Disease, Hypertension, Myocardial Infarction, Clustered Wound: No Type II Diabetes, Neuropathy Photos Wound Measurements Length: (cm) 0.3 % Reduction in A Width: (cm) 0.2 % Reduction in V Depth: (cm) 0.2 Epithelializatio Area: (cm) 0.047 Tunneling: Volume: (cm) 0.009 Undermining: Wound Description Classification: Grade 2 Foul Odor After Wound Margin: Flat and Intact Slough/Fibrino Exudate Amount: Small Exudate Type: Serosanguineous Exudate Color: red, brown Wound Bed Granulation Amount: Large (67-100%) Granulation Quality: Pink Fascia Exposed: Necrotic Amount: None Present (0%) Fat Layer (Subcu Tendon Exposed: Muscle Exposed: Joint Exposed: Bone Exposed: Cleansing: No Yes Exposed Structure No taneous Tissue) Exposed: Yes No No No No rea: 82.9% olume: 66.7% n: Small (1-33%) No No Treatment Notes Wound #2 (Left Metatarsal head fifth) 1. Cleanse With Wound Cleanser 3. Primary Dressing Applied Calcium Alginate Ag 4. Secondary Dressing Dry Gauze Roll Gauze 5. Secured  With Secretary/administrator) Signed: 01/18/2020 4:37:08 PM By: Benjaman Kindler EMT/HBOT Signed: 01/24/2020 6:46:58 PM By: Zandra Abts RN, BSN Entered By: Benjaman Kindler on 01/18/2020 14:58:39 -------------------------------------------------------------------------------- Vitals Details Patient Name: Date of Service: [name], [name] 01/18/2020 9:00 AM Medical Record [id number] Patient Account Number: [id number] Date of Birth/Sex: Treating RN: 1971-01-06 (49 y.o. [name] Primary Care Shelton Soler: Hoy Register Other Clinician: Referring Canio Winokur: Treating Eren Ryser/Extender:Robson, Gweneth Fritter, Enobong Weeks in Treatment: 1 Vital Signs Time Taken: 08:46 Temperature (F): 97.8 Height (in): 78 Pulse (bpm): 84 Weight (lbs): 285 Respiratory Rate (breaths/min): 18 Body Mass Index (BMI): 32.9 Blood Pressure (mmHg): 130/97 Reference Range: 80 - 120 mg / dl Electronic Signature(s) Signed: 01/24/2020 6:46:58 PM By: Zandra Abts RN, BSN Entered By: Zandra Abts on 01/18/2020 08:47:44

## 2020-01-25 ENCOUNTER — Encounter (HOSPITAL_BASED_OUTPATIENT_CLINIC_OR_DEPARTMENT_OTHER): Payer: Self-pay | Admitting: Internal Medicine

## 2020-01-25 ENCOUNTER — Encounter: Payer: Self-pay | Admitting: Family Medicine

## 2020-01-25 ENCOUNTER — Ambulatory Visit: Payer: Self-pay | Attending: Family Medicine | Admitting: Family Medicine

## 2020-01-25 ENCOUNTER — Other Ambulatory Visit: Payer: Self-pay

## 2020-01-25 VITALS — BP 108/70 | HR 86 | Ht 78.0 in | Wt 278.0 lb

## 2020-01-25 DIAGNOSIS — I5022 Chronic systolic (congestive) heart failure: Secondary | ICD-10-CM

## 2020-01-25 DIAGNOSIS — E1169 Type 2 diabetes mellitus with other specified complication: Secondary | ICD-10-CM

## 2020-01-25 DIAGNOSIS — E669 Obesity, unspecified: Secondary | ICD-10-CM

## 2020-01-25 DIAGNOSIS — I10 Essential (primary) hypertension: Secondary | ICD-10-CM

## 2020-01-25 DIAGNOSIS — I2510 Atherosclerotic heart disease of native coronary artery without angina pectoris: Secondary | ICD-10-CM

## 2020-01-25 LAB — POCT GLYCOSYLATED HEMOGLOBIN (HGB A1C): HbA1c, POC (controlled diabetic range): 9.4 % — AB (ref 0.0–7.0)

## 2020-01-25 LAB — GLUCOSE, POCT (MANUAL RESULT ENTRY): POC Glucose: 110 mg/dl — AB (ref 70–99)

## 2020-01-25 MED ORDER — GLIPIZIDE 10 MG PO TABS: ORAL_TABLET | ORAL | 6 refills | Status: DC

## 2020-01-25 MED ORDER — TRUEPLUS PEN NEEDLES 31G X 5 MM MISC: 6 refills | Status: DC

## 2020-01-25 MED ORDER — LANTUS SOLOSTAR 100 UNIT/ML ~~LOC~~ SOPN: 20.0000 [IU] | PEN_INJECTOR | Freq: Every day | SUBCUTANEOUS | 6 refills | Status: DC

## 2020-01-25 NOTE — Progress Notes (Signed)
Subjective:  Patient ID: [name], male    DOB: 1971/06/05  Age: 49 y.o. MRN: [id number]  CC: Diabetes   HPI [name] is a 49 year old male with a history of ischemic cardiomyopathy previously on milrinone (EF 40 to 45% from echo 08/2019 ), coronary artery disease (s/p DES x2) , type 2 diabetes mellitus (A1c 9.4), hypertension, obesity here for follow-up visit.  100-150 are his average sugars; he has had no hypoglycemic episodes. Highest sugar has been in the 170 range Cut out diet drinks, has been walking a lot and has lost weight. He was out of Glipizide for 2 weeks though. He is not up-to-date on annual eye exam due to lack of medical coverage  He sustained a left foot ulcer after stepped on something which poked a hole in his left foot.  Seen in urgent care on 12/19/2019 after which he was followed by wound care and is currently on an antibiotic; he had a visit today with wound care. Compliant with his antihypertensives. Last visit to the CHF clinic was in 08/2019 at which time Wilder Glade was added to his regimen for optimal glycemic control.  He denies presence of pedal edema, dyspnea, orthopnea. Uses Prilosec prn for GERD.  Past Medical History:  Diagnosis Date  . CAD (coronary artery disease)    NSTEMI 10/2011: LHC 11/04/11: pLAD 90%, mLAD 60-70%, small D2 sub totally occluded at ostium, small OM1 90% ostial, 90% mid, mOM2 30%, oPL 80%, RCA 30%, dPDA 80%, EF 20% with ant AK.  PCI:  Promus DES to pLAD.  [name] Chronic systolic heart failure ([place])   . DM2 (diabetes mellitus, type 2) ([place])   . HTN (hypertension)   . Hyperlipidemia   . Ischemic cardiomyopathy    Echo 11/03/11: mod LVH, mild focal basal septal hypertrophy, EF 15%, grade 2 diast dysfxn, mild MR, mild to mod LAE, mild RVE, mild to mod reduced RVSF.  EF 3/5 50% by echo  . Obesity     Past Surgical History:  Procedure Laterality Date  . CARDIAC CATHETERIZATION N/A 10/25/2016   Procedure: Right/Left Heart Cath  and Coronary Angiography;  Surgeon: Leonie Man, MD;  Location: [facility];  Service: Cardiovascular;  Laterality: N/A;  . CARDIAC CATHETERIZATION N/A 10/25/2016   Procedure: Coronary Stent Intervention;  Surgeon: Leonie Man, MD;  Location: [facility];  Service: Cardiovascular;  Laterality: N/A;  . CARDIAC CATHETERIZATION N/A 10/28/2016   Procedure: Coronary Stent Intervention;  Surgeon: Burnell Blanks, MD;  Location: [facility];  Service: Cardiovascular;  Laterality: N/A;  . LEFT AND RIGHT HEART CATHETERIZATION WITH CORONARY ANGIOGRAM N/A 11/04/2011   Procedure: LEFT AND RIGHT HEART CATHETERIZATION WITH CORONARY ANGIOGRAM;  Surgeon: Minus Breeding, MD;  Location: [facility] CATH [facility];  Service: Cardiovascular;  Laterality: N/A;  . NONE    . PERCUTANEOUS CORONARY STENT INTERVENTION (PCI-S)  11/04/2011   Procedure: PERCUTANEOUS CORONARY STENT INTERVENTION (PCI-S);  Surgeon: Sherren Mocha, MD;  Location: [facility] C[facility];  Service: Cardiovascular;;    Family History  Problem Relation Age of Onset  . Heart disease Mother        MOTHER HAD CABG    No Known Allergies  Outpatient Medications Prior to Visit  Medication Sig Dispense Refill  . aspirin 81 MG chewable tablet Chew 1 tablet (81 mg total) by mouth daily. 30 tablet 6  . atorvastatin (LIPITOR) 80 MG tablet Take 1 tablet (80 mg total) by mouth daily. 30 tablet 3  .  Blood Glucose Monitoring Suppl (TRUE METRIX METER) DEVI 1 each by Does not apply route 3 (three) times daily. 1 Device 0  . carvedilol (COREG) 6.25 MG tablet Take 1 tablet (6.25 mg total) by mouth 2 (two) times daily with a meal. 60 tablet 6  . digoxin (LANOXIN) 0.125 MG tablet Take 1 tablet (125 mcg total) by mouth daily. 90 tablet 0  . doxycycline (VIBRAMYCIN) 100 MG capsule Take 1 capsule (100 mg total) by mouth 2 (two) times daily. 20 capsule 0  . furosemide (LASIX) 40 MG tablet Take 1 tablet (40 mg total) by mouth daily. 90 tablet 1  . glipiZIDE  (GLUCOTROL) 10 MG tablet TAKE 1 TABLET BY MOUTH TWICE DAILY BEFORE MEAL(S). 60 tablet 0  . glucose blood (TRUE METRIX BLOOD GLUCOSE TEST) test strip 1 each by Other route 3 (three) times daily. 100 each 12  . Insulin Glargine (LANTUS SOLOSTAR) 100 UNIT/ML Solostar Pen Inject 15 Units into the skin daily. 5 pen 6  . omeprazole (PRILOSEC) 20 MG capsule TAKE 1 CAPSULE BY MOUTH ONCE A DAY 30 capsule 1  . sacubitril-valsartan (ENTRESTO) 97-103 MG Take 1 tablet by mouth 2 (two) times daily. 180 tablet 3  . sildenafil (REVATIO) 20 MG tablet Take 1 tablet (20 mg total) by mouth as needed (for erectile dysfunction). 10 tablet 3  . spironolactone (ALDACTONE) 50 MG tablet TAKE 1 TABLET BY MOUTH ONCE DAILY 30 tablet 5  . TRUEplus Lancets 28G MISC 1 each by Does not apply route 3 (three) times daily. 100 each 11  . TRUEPLUS PEN NEEDLES 31G X 5 MM MISC USE AS DIRECTED AT BEDTIME. 100 each 0  . dapagliflozin propanediol (FARXIGA) 10 MG TABS tablet Take 10 mg by mouth daily before breakfast. (Patient not taking: Reported on 01/25/2020) 30 tablet 6  . mupirocin ointment (BACTROBAN) 2 % Apply 1 application topically 2 (two) times daily. (Patient not taking: Reported on 01/25/2020) 22 g 0   No facility-administered medications prior to visit.     ROS Review of Systems  Constitutional: Negative for activity change and appetite change.  HENT: Negative for sinus pressure and sore throat.   Eyes: Negative for visual disturbance.  Respiratory: Negative for cough, chest tightness and shortness of breath.   Cardiovascular: Negative for chest pain and leg swelling.  Gastrointestinal: Negative for abdominal distention, abdominal pain, constipation and diarrhea.  Endocrine: Negative.   Genitourinary: Negative for dysuria.  Musculoskeletal: Negative for joint swelling and myalgias.       Left foot in a boot  Skin: Positive for wound. Negative for rash.  Allergic/Immunologic: Negative.   Neurological: Negative for  weakness, light-headedness and numbness.  Psychiatric/Behavioral: Negative for dysphoric mood and suicidal ideas.    Objective:  BP 108/70   Pulse 86   Ht 6\' 6"  (1.981 m)   Wt 278 lb (126.1 kg)   SpO2 99%   BMI 32.13 kg/m   BP/Weight 01/25/2020 12/19/2019 08/31/2019  Systolic BP 108 155 122  Diastolic BP 70 93 98  Wt. (Lbs) 278 - 283.8  BMI 32.13 - 32.8      Physical Exam Constitutional:      Appearance: He is well-developed.  Neck:     Vascular: No JVD.  Cardiovascular:     Rate and Rhythm: Normal rate.     Heart sounds: Normal heart sounds. No murmur.  Pulmonary:     Effort: Pulmonary effort is normal.     Breath sounds: Normal breath sounds. No wheezing or rales.  Chest:     Chest wall: No tenderness.  Abdominal:     General: Bowel sounds are normal. There is no distension.     Palpations: Abdomen is soft. There is no mass.     Tenderness: There is no abdominal tenderness.  Musculoskeletal:        General: Normal range of motion.     Right lower leg: No edema.     Left lower leg: No edema.     Comments: Left foot in a boot  Neurological:     Mental Status: He is alert and oriented to person, place, and time.  Psychiatric:        Mood and Affect: Mood normal.     CMP Latest Ref Rng & Units 08/31/2019 07/06/2018 03/20/2018  Glucose 70 - 99 mg/dL 371(I) 967(E) 938(B)  BUN 6 - 20 mg/dL 01(B) 51(W) 24  Creatinine 0.61 - 1.24 mg/dL 2.58 5.27(P) 8.24(M)  Sodium 135 - 145 mmol/L 136 139 139  Potassium 3.5 - 5.1 mmol/L 4.5 4.1 4.5  Chloride 98 - 111 mmol/L 103 104 99  CO2 22 - 32 mmol/L 22 27 22   Calcium 8.9 - 10.3 mg/dL 9.4 9.5 9.5  Total Protein 6.0 - 8.5 g/dL - - 7.0  Total Bilirubin 0.0 - 1.2 mg/dL - - 0.5  Alkaline Phos 39 - 117 IU/L - - 53  AST 0 - 40 IU/L - - 16  ALT 0 - 44 IU/L - - 23    Lipid Panel     Component Value Date/Time   CHOL 189 08/31/2019 1156   CHOL 160 03/20/2018 0829   TRIG 180 (H) 08/31/2019 1156   HDL 39 (L) 08/31/2019 1156   HDL 34  (L) 03/20/2018 0829   CHOLHDL 4.8 08/31/2019 1156   VLDL 36 08/31/2019 1156   LDLCALC 114 (H) 08/31/2019 1156   LDLCALC 94 03/20/2018 0829   LDLDIRECT 117.0 06/14/2013 0826    CBC    Component Value Date/Time   WBC 6.3 03/20/2018 0829   WBC 9.1 11/29/2016 1431   RBC 5.40 03/20/2018 0829   RBC 5.47 11/29/2016 1431   HGB 14.9 03/20/2018 0829   HCT 45.0 03/20/2018 0829   PLT 203 03/20/2018 0829   MCV 83 03/20/2018 0829   MCH 27.6 03/20/2018 0829   MCH 26.3 (L) 11/29/2016 1431   MCHC 33.1 03/20/2018 0829   MCHC 33.0 11/29/2016 1431   RDW 13.6 03/20/2018 0829   LYMPHSABS 2.0 03/20/2018 0829   MONOABS 546 11/29/2016 1431   EOSABS 0.2 03/20/2018 0829   BASOSABS 0.0 03/20/2018 0829   Lab Results  Component Value Date   HGBA1C 9.4 (A) 01/25/2020     Assessment & Plan:   1. Diabetes mellitus type 2 in obese (HCC) Uncontrolled with A1c of 9.4; goal A1c is less than 7.0 Lantus dose increased from 15 to 20 units at bedtime Continue with glipizide and Farxiga Counseled on Diabetic diet, my plate method, 01/27/2020 minutes of moderate intensity exercise/week Blood sugar logs with fasting goals of 80-120 mg/dl, random of less than 353 and in the event of sugars less than 60 mg/dl or greater than 614 mg/dl encouraged to notify the clinic. Advised on the need for annual eye exams, annual foot exams, Pneumonia vaccine. - POCT glucose (manual entry) - POCT glycosylated hemoglobin (Hb A1C) - Insulin Pen Needle (TRUEPLUS PEN NEEDLES) 31G X 5 MM MISC; USE AS DIRECTED AT BEDTIME.  Dispense: 100 each; Refill: 6 - Insulin Glargine (LANTUS SOLOSTAR) 100  UNIT/ML Solostar Pen; Inject 20 Units into the skin daily.  Dispense: 5 pen; Refill: 6  2. Coronary artery disease involving native coronary artery of native heart without angina pectoris Status post DES x2 Asymptomatic Risk factor modification - glipiZIDE (GLUCOTROL) 10 MG tablet; TAKE 1 TABLET BY MOUTH TWICE DAILY BEFORE MEAL(S).  Dispense: 60  tablet; Refill: 6  3. Chronic systolic heart failure (HCC) EF of 40 to 45% Euvolemic Continue Entresto, spironolactone Follow-up with the CHF clinic  4. Atherosclerosis of native coronary artery of native heart without angina pectoris See #2 above  5. Essential hypertension, benign Controlled Continue antihypertensives Counseled on blood pressure goal of less than 130/80, low-sodium, DASH diet, medication compliance, 150 minutes of moderate intensity exercise per week. Discussed medication compliance, adverse effects. - Basic Metabolic Panel     Hoy Register, MD, FAAFP. [facility] [place], [place] [phone]   01/25/2020, 3:41 PM

## 2020-01-25 NOTE — Patient Instructions (Signed)

## 2020-01-26 LAB — BASIC METABOLIC PANEL
BUN/Creatinine Ratio: 18 (ref 9–20)
BUN: 24 mg/dL (ref 6–24)
CO2: 24 mmol/L (ref 20–29)
Calcium: 9.9 mg/dL (ref 8.7–10.2)
Chloride: 105 mmol/L (ref 96–106)
Creatinine, Ser: 1.3 mg/dL — ABNORMAL HIGH (ref 0.76–1.27)
GFR calc Af Amer: 76 mL/min/{1.73_m2} (ref >59)
GFR calc non Af Amer: 65 mL/min/{1.73_m2} (ref >59)
Glucose: 109 mg/dL — ABNORMAL HIGH (ref 65–99)
Potassium: 4 mmol/L (ref 3.5–5.2)
Sodium: 144 mmol/L (ref 134–144)

## 2020-01-26 NOTE — Progress Notes (Signed)
[name] ([id number]) Visit Report for 01/25/2020 HPI Details Patient Name: Date of Service: [name], [name] 01/25/2020 1:30 PM Medical Record [id number] Patient Account Number: [id number] Date of Birth/Sex: Treating RN: January 15, 1971 (49 y.o. M) Primary Care Provider: Charlott Rakes Other Clinician: Referring Provider: Treating Provider/Extender:Britanee Vanblarcom, Sammuel Cooper, Charlane Ferretti Weeks in Treatment: 2 History of Present Illness HPI Description: ADMISSION 01/11/2020 This is a 49 year old man with uncontrolled type 2 diabetes with a recent hemoglobin A1c earlier this year of 13.4. He is on insulin and glipizide. He does not take his blood sugars at home. He does have a follow-up with primary care later this month I believe on January 27. He tells [place] that roughly a month ago he was walking with a shoe with a hole in his foot. He took the shoe off and there was an open wound at roughly the left fourth met head. This has significant undermining and raised edges. He has not noticed any purulence he does not feel unwell. More recently he was taking skin off the bottom of his foot and has a superficial area on the left fifth met head. He has not been offloading this. The patient was in the ER on 12/20. They gave him Bactroban which she has been using on the wound and 10 days worth of doxycycline. No x-rays were done. He has not had vascular studies. He is also been using hydrogen peroxide. Past medical history type 2 diabetes uncontrolled, chronic systolic heart failure, coronary artery disease with a history of congestive heart failure with stents. Hypertension hyperlipidemia and chronic renal insufficiency ABI in this clinic was 1.14 on the left. 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 He is uncertain whether he would be able to work if we put him on some form of restriction 1/19; he is generally doing quite well. Using silver alginate on the wounds.  Things actually look better. He has a forefoot offloading boot which she seems to be compliant about. He has support at work to stay off his foot is much as possible which is gratifying. Culture I did last week showed a few Enterococcus faecalis. I am going to put him on Augmentin. I talked about ordering an x-ray in my note last week but that does not seem to have happened. We will review reorder the x-ray this week. 1/26; x-ray reordered last week was negative for osteomyelitis. We are using silver alginate on the wound on the third and fifth met heads on the left. He is using a Publishing rights manager) Signed: 01/25/2020 5:32:11 PM By: Linton Ham MD Entered By: Linton Ham on 01/25/2020 15:23:03 -------------------------------------------------------------------------------- Physical Exam Details Patient Name: Date of Service: [name] 01/25/2020 1:30 PM Medical Record [id number] Patient Account Number: [id number] Date of Birth/Sex: Treating RN: Nov 24, 1971 (49 y.o. M) Primary Care Provider: Charlott Rakes Other Clinician: Referring Provider: Treating Provider/Extender:Marrisa Kimber, Sammuel Cooper, Enobong Weeks in Treatment: 2 Constitutional Sitting or standing Blood Pressure is within target range for patient.. Pulse regular and within target range for patient.[name] Respirations regular, non-labored and within target range.. Temperature is normal and within the target range for the patient.[name] Appears in no distress. Cardiovascular Pedal pulses are palpable. Notes Wound exam The area over the fifth met head actually appears to be closing over. The larger area 3rd met head is smaller but still punched out with rolled edges. I did not debride this today. I will see how this looks next week in terms of wound  volume. No evidence of surrounding infection Electronic Signature(s) Signed: 01/25/2020 5:32:11 PM By: Linton Ham MD Entered By:  Linton Ham on 01/25/2020 15:24:36 -------------------------------------------------------------------------------- Physician Orders Details Patient Name: Date of Service: [name] 01/25/2020 1:30 PM Medical Record [id number] Patient Account Number: [id number] Date of Birth/Sex: Treating RN: 1971-02-02 (48 y.o. [name]) Carlene Coria Primary Care Provider: Charlott Rakes Other Clinician: Referring Provider: Treating Provider/Extender:Taylar Hartsough, Sammuel Cooper, Douglass Rivers in Treatment: 2 Verbal / Phone Orders: No Diagnosis Coding ICD-10 Coding Code Description E11.621 Type 2 diabetes mellitus with foot ulcer L97.522 Non-pressure chronic ulcer of other part of left foot with fat layer exposed L97.521 Non-pressure chronic ulcer of other part of left foot limited to breakdown of skin E11.42 Type 2 diabetes mellitus with diabetic polyneuropathy Follow-up Appointments Return Appointment in 1 week. Dressing Change Frequency Change dressing every day. Wound Cleansing May shower and wash wound with soap and water. Primary Wound Dressing Calcium Alginate with Silver Secondary Dressing Kerlix/Rolled Gauze - secure with tape Dry Gauze Off-Loading Other: - front off loading shoe Electronic Signature(s) Signed: 01/25/2020 5:32:11 PM By: Linton Ham MD Signed: 01/26/2020 7:19:07 AM By: Carlene Coria RN Entered By: Carlene Coria on 01/25/2020 13:45:07 -------------------------------------------------------------------------------- Problem List Details Patient Name: Date of Service: [name] 01/25/2020 1:30 PM Medical Record [id number] Patient Account Number: [id number] Date of Birth/Sex: Treating RN: June 19, 1971 (48 y.o. [name] Primary Care Provider: Charlott Rakes Other Clinician: Referring Provider: Treating Provider/Extender:Batya Citron, Sammuel Cooper, Enobong Weeks in Treatment: 2 Active Problems ICD-10 Evaluated Encounter Code Description  Active Date Today Diagnosis E11.621 Type 2 diabetes mellitus with foot ulcer 01/11/2020 No Yes L97.522 Non-pressure chronic ulcer of other part of left foot 01/11/2020 No Yes with fat layer exposed L97.521 Non-pressure chronic ulcer of other part of left foot 01/11/2020 No Yes limited to breakdown of skin E11.42 Type 2 diabetes mellitus with diabetic polyneuropathy 01/11/2020 No Yes Inactive Problems Resolved Problems Electronic Signature(s) Signed: 01/25/2020 5:32:11 PM By: Linton Ham MD Entered By: Linton Ham on 01/25/2020 15:21:50 -------------------------------------------------------------------------------- Progress Note Details Patient Name: Date of Service: DRESHON, PROFFIT 01/25/2020 1:30 PM Medical Record [id number] Patient Account Number: [id number] Date of Birth/Sex: Treating RN: December 01, 1971 (49 y.o. M) Primary Care Provider: Charlott Rakes Other Clinician: Referring Provider: Treating Provider/Extender:Druanne Bosques, Sammuel Cooper, Charlane Ferretti Weeks in Treatment: 2 Subjective History of Present Illness (HPI) ADMISSION 01/11/2020 This is a 49 year old man with uncontrolled type 2 diabetes with a recent hemoglobin A1c earlier this year of 13.4. He is on insulin and glipizide. He does not take his blood sugars at home. He does have a follow-up with primary care later this month I believe on January 27. He tells [place] that roughly a month ago he was walking with a shoe with a hole in his foot. He took the shoe off and there was an open wound at roughly the left fourth met head. This has significant undermining and raised edges. He has not noticed any purulence he does not feel unwell. More recently he was taking skin off the bottom of his foot and has a superficial area on the left fifth met head. He has not been offloading this. The patient was in the ER on 12/20. They gave him Bactroban which she has been using on the wound and 10 days worth of doxycycline. No x-rays were  done. He has not had vascular studies. He is also been using hydrogen peroxide. Past medical history type 2 diabetes uncontrolled, chronic systolic heart failure, coronary artery disease with a history of congestive heart  failure with stents. Hypertension hyperlipidemia and chronic renal insufficiency ABI in this clinic was 1.14 on the left. 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 He is uncertain whether he would be able to work if we put him on some form of restriction 1/19; he is generally doing quite well. Using silver alginate on the wounds. Things actually look better. He has a forefoot offloading boot which she seems to be compliant about. He has support at work to stay off his foot is much as possible which is gratifying. Culture I did last week showed a few Enterococcus faecalis. I am going to put him on Augmentin. I talked about ordering an x-ray in my note last week but that does not seem to have happened. We will review reorder the x-ray this week. 1/26; x-ray reordered last week was negative for osteomyelitis. We are using silver alginate on the wound on the third and fifth met heads on the left. He is using a [employer] forefoot off loader Objective Constitutional Sitting or standing Blood Pressure is within target range for patient.. Pulse regular and within target range for patient.[name] Respirations regular, non-labored and within target range.. Temperature is normal and within the target range for the patient.[name] Appears in no distress. Vitals Time Taken: 2:20 PM, Height: 78 in, Weight: 285 lbs, BMI: 32.9, Temperature: 98.6 F, Pulse: 84 bpm, Respiratory Rate: 18 breaths/min, Blood Pressure: 120/84 mmHg. Cardiovascular Pedal pulses are palpable. General Notes: Wound exam ooThe area over the fifth met head actually appears to be closing over. The larger area 3rd met head is smaller but still punched out with rolled edges. I did not debride this today. I  will see how this looks next week in terms of wound volume. No evidence of surrounding infection Integumentary (Hair, Skin) Wound #1 status is Open. Original cause of wound was Trauma. The wound is located on the Left Metatarsal head third. The wound measures 0.7cm length x 0.5cm width x 0.3cm depth; 0.275cm^2 area and 0.082cm^3 volume. There is Fat Layer (Subcutaneous Tissue) Exposed exposed. There is no tunneling or undermining noted. There is a medium amount of serosanguineous drainage noted. The wound margin is flat and intact. There is large (67-100%) pink, pale granulation within the wound bed. There is a small (1-33%) amount of necrotic tissue within the wound bed including Adherent Slough. Wound #2 status is Healed - Epithelialized. Original cause of wound was Trauma. The wound is located on the Left Metatarsal head fifth. The wound measures 0cm length x 0cm width x 0cm depth; 0cm^2 area and 0cm^3 volume. There is no tunneling or undermining noted. There is a none present amount of drainage noted. The wound margin is flat and intact. There is no granulation within the wound bed. There is no necrotic tissue within the wound bed. 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 Dressing Change Frequency: Change dressing every day. Wound Cleansing: May shower and wash wound with soap and water. Primary Wound Dressing: Calcium Alginate with Silver Secondary Dressing: Kerlix/Rolled Gauze - secure with tape Dry Gauze Off-Loading: Other: - front off loading shoe 1. I am going to continue with the silver alginate for this week. 2. Careful attention to the  third met head wound in terms of  surface area and volume next week. This may need circumference debridement. 3. No evidence of infection. Plain x-ray did was negative for osteomyelitis. At this point I am going to withhold further imaging and follow the wound clinically 4. I do not think there is significant evidence of PAD Electronic Signature(s) Signed: 01/25/2020 5:32:11 PM By: Linton Ham MD Entered By: Linton Ham on 01/25/2020 15:27:12 -------------------------------------------------------------------------------- SuperBill Details Patient Name: Date of Service: ULYESS, MUTO 01/25/2020 Medical Record [id number] Patient Account Number: [id number] Date of Birth/Sex: Treating RN: 1971/08/20 (48 y.o. [name]) Dolores Lory, Morey Hummingbird Primary Care Provider: Charlott Rakes Other Clinician: Referring Provider: Treating Provider/Extender:Sarika Baldini, Sammuel Cooper, Enobong Weeks in Treatment: 2 Diagnosis Coding ICD-10 Codes Code Description E11.621 Type 2 diabetes mellitus with foot ulcer L97.522 Non-pressure chronic ulcer of other part of left foot with fat layer exposed L97.521 Non-pressure chronic ulcer of other part of left foot limited to breakdown of skin E11.42 Type 2 diabetes mellitus with diabetic polyneuropathy Facility Procedures CPT4 Code: [id number] Description: 99213 - WOUND CARE VISIT-LEV 3 EST PT Modifier: Quantity: 1 Physician Procedures CPT4 Code Description: [id number] - WC PHYS LEVEL 3 - EST PT ICD-10 Diagnosis Description E11.621 Type 2 diabetes mellitus with foot ulcer L97.522 Non-pressure chronic ulcer of other part of left foot wi L97.521 Non-pressure chronic ulcer of other  part of left foot li E11.42 Type 2 diabetes mellitus with diabetic polyneuropathy Modifier: th fat layer exp mited to breakdo Quantity: 1 osed wn of skin Electronic Signature(s) Signed: 01/25/2020 5:32:11 PM By: Linton Ham MD Entered By: Linton Ham on 01/25/2020 15:27:59

## 2020-01-27 ENCOUNTER — Telehealth: Payer: Self-pay

## 2020-01-27 NOTE — Telephone Encounter (Signed)
Patient name and DOB has been verified Patient was informed of lab results. Patient had no questions.  

## 2020-01-27 NOTE — Telephone Encounter (Signed)
-----   Message from Hoy Register, MD sent at 01/26/2020 11:52 AM EST ----- Please inform the patient that labs are normal. Thank you.

## 2020-01-31 NOTE — Progress Notes (Signed)
ADAIR, LAUDERBACK ([id number]) Visit Report for 01/25/2020 Arrival Information Details Patient Name: Date of Service: [name], [name] 01/25/2020 1:30 PM Medical Record [id number] Patient Account Number: [id number] Date of Birth/Sex: Treating RN: 19-Apr-1971 (49 y.o. [name] Primary Care Markell Sciascia: Hoy Register Other Clinician: Referring Elody Kleinsasser: Treating Nyjah Denio/Extender:Robson, Gweneth Fritter, Sherlie Ban in Treatment: 2 Visit Information History Since Last Visit Added or deleted any medications: No Patient Arrived: Ambulatory Any new allergies or adverse reactions: No Arrival Time: 14:18 Had a fall or experienced change in No Accompanied By: alone activities of daily living that may affect Transfer Assistance: None risk of falls: Patient Identification Verified: Yes Signs or symptoms of abuse/neglect since No Secondary Verification Process Yes last visito Completed: Hospitalized since last visit: No Patient Requires Transmission-Based No Implantable device outside of the clinic No Precautions: excluding Patient Has Alerts: No cellular tissue based products placed in the center since last visit: Has Dressing in Place as Prescribed: Yes Has Footwear/Offloading in Place as Yes Prescribed: Left: Wedge Shoe Pain Present Now: No Electronic Signature(s) Signed: 01/31/2020 6:39:44 PM By: Zandra Abts RN, BSN Entered By: Zandra Abts on 01/25/2020 14:20:55 -------------------------------------------------------------------------------- Clinic Level of Care Assessment Details Patient Name: Date of Service: [name], [name] 01/25/2020 1:30 PM Medical Record [id number] Patient Account Number: [id number] Date of Birth/Sex: Treating RN: January 10, 1971 (49 y.o. [name]) [name] Primary Care Jaxie Racanelli: Hoy Register Other Clinician: Referring Ronnica Dreese: Treating Morrison Mcbryar/Extender:Robson, Gweneth Fritter, Enobong Weeks in Treatment: 2 Clinic Level of  Care Assessment Items TOOL 4 Quantity Score X - Use when only an EandM is performed on FOLLOW-UP visit 1 0 ASSESSMENTS - Nursing Assessment / Reassessment X - Reassessment of Co-morbidities (includes updates in patient status) 1 10 X - Reassessment of Adherence to Treatment Plan 1 5 ASSESSMENTS - Wound and Skin Assessment / Reassessment X - Simple Wound Assessment / Reassessment - one wound 1 5 []  - Complex Wound Assessment / Reassessment - multiple wounds 0 []  - Dermatologic / Skin Assessment (not related to wound area) 0 ASSESSMENTS - Focused Assessment []  - Circumferential Edema Measurements - multi extremities 0 []  - Nutritional Assessment / Counseling / Intervention 0 []  - Lower Extremity Assessment (monofilament, tuning fork, pulses) 0 []  - Peripheral Arterial Disease Assessment (using hand held doppler) 0 ASSESSMENTS - Ostomy and/or Continence Assessment and Care []  - Incontinence Assessment and Management 0 []  - Ostomy Care Assessment and Management (repouching, etc.) 0 PROCESS - Coordination of Care X - Simple Patient / Family Education for ongoing care 1 15 []  - Complex (extensive) Patient / Family Education for ongoing care 0 X - Staff obtains , Records, Test Results / Process Orders 1 10 []  - Staff telephones HHA, Nursing Homes / Clarify orders / etc 0 []  - Routine Transfer to another Facility (non-emergent condition) 0 []  - Routine Hospital Admission (non-emergent condition) 0 []  - New Admissions / / Ordering NPWT, Apligraf, etc. 0 []  - Emergency Hospital Admission (emergent condition) 0 X - Simple Discharge Coordination 1 10 []  - Complex (extensive) Discharge Coordination 0 PROCESS - Special Needs []  - Pediatric / Minor Patient Management 0 []  - Isolation Patient Management 0 []  - Hearing / Language / Visual special needs 0 []  - Assessment of Community assistance (transportation, D/C planning, etc.) 0 []  - Additional assistance /  Altered mentation 0 []  - Support Surface(s) Assessment (bed, cushion, seat, etc.) 0 INTERVENTIONS - Wound Cleansing / Measurement X - Simple Wound Cleansing - one wound 1 5 []  - Complex Wound Cleansing -  multiple wounds 0 X - Wound Imaging (photographs - any number of wounds) 1 5 []  - Wound Tracing (instead of photographs) 0 X - Simple Wound Measurement - one wound 1 5 []  - Complex Wound Measurement - multiple wounds 0 INTERVENTIONS - Wound Dressings []  - Small Wound Dressing one or multiple wounds 0 X - Medium Wound Dressing one or multiple wounds 1 15 []  - Large Wound Dressing one or multiple wounds 0 X - Application of Medications - topical 1 5 []  - Application of Medications - injection 0 INTERVENTIONS - Miscellaneous []  - External ear exam 0 []  - Specimen Collection (cultures, biopsies, blood, body fluids, etc.) 0 []  - Specimen(s) / Culture(s) sent or taken to Lab for analysis 0 []  - Patient Transfer (multiple staff / / Similar devices) 0 []  - Simple Staple / Suture removal (25 or less) 0 []  - Complex Staple / Suture removal (26 or more) 0 []  - Hypo / Hyperglycemic Management (close monitor of Blood Glucose) 0 []  - Ankle / Brachial Index (ABI) - do not check if billed separately 0 X - Vital Signs 1 5 Has the patient been seen at the hospital within the last three years: Yes Total Score: 95 Level Of Care: New/Established - Level 3 Electronic Signature(s) Signed: 01/26/2020 7:19:07 AM By: RN Entered By: on 01/25/2020 14:59:06 -------------------------------------------------------------------------------- Encounter Discharge Information Details Patient Name: Date of Service: [name], [name] 01/25/2020 1:30 PM Medical Record Patient Account Number: Date of Birth/Sex: Treating RN: 20-Jul-1971 (49 y.o. Primary Care Montie Gelardi: Other Clinician: Referring Shahin Knierim: Treating  Lorenzo Pereyra/Extender:Robson, , 01/28/2020 in Treatment: 2 Encounter Discharge Information Items Discharge Condition: Stable Ambulatory Status: Ambulatory Discharge Destination: Home Transportation: Private Auto Accompanied By: self Schedule Follow-up Appointment: Yes Clinical Summary of Care: Patient Declined Electronic Signature(s) Signed: 01/26/2020 7:19:07 AM By: [name] RN Entered By: 01/27/2020 on 01/25/2020 15:00:41 -------------------------------------------------------------------------------- Lower Extremity Assessment Details Patient Name: Date of Service: [name], [name] 01/25/2020 1:30 PM Medical Record [id number] Patient Account Number: 12/25/1971 Date of Birth/Sex: Treating RN: 21-Jul-1971 (49 y.o. Hoy Register Primary Care Makynzee Tigges: Gweneth Fritter Other Clinician: Referring Cori Justus: Treating Demitra Danley/Extender:Robson, Sherlie Ban, 01/28/2020 Weeks in Treatment: 2 Edema Assessment Assessed: [Left: No] [Right: No] Edema: [Left: N] [Right: o] Calf Left: Right: Point of Measurement: 36 cm From Medial Instep 38.5 cm cm Ankle Left: Right: Point of Measurement: 10 cm From Medial Instep 23 cm cm Vascular Assessment Pulses: Dorsalis Pedis Palpable: [Left:Yes] Electronic Signature(s) Signed: 01/31/2020 6:39:44 PM By: [name] RN, BSN Entered By: 01/27/2020 on 01/25/2020 14:30:40 -------------------------------------------------------------------------------- Multi Wound Chart Details Patient Name: Date of Service: [name], [name] 01/25/2020 1:30 PM Medical Record [id number] Patient Account Number: 12/25/1971 Date of Birth/Sex: Treating RN: 01/19/1971 (49 y.o. 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] [2:Lower Extremity] Comorbid History: [1:Congestive Heart Failure, Congestive Heart Failure, N/A Coronary Artery Disease, Coronary Artery Disease, Hypertension, Myocardial Hypertension, Myocardial Infarction, Type II Diabetes, Infarction, Type II Diabetes, Neuropathy]  [2:Neuropathy] Date Acquired: [1:12/11/2019] [2:01/04/2020] [N/A:N/A] Weeks of Treatment: [1:2] [2:2] [N/A:N/A] Wound Status: [  1:Open] [2:Healed - Epithelialized] [N/A:N/A] Measurements L x W x D 0.7x0.5x0.3 [2:0x0x0] [N/A:N/A] (cm) Area (cm) : [1:0.275] [2:0] [N/A:N/A] Volume (cm) : [1:0.082] [2:0] [N/A:N/A] % Reduction in Area: [1:65.00%] [2:100.00%] [N/A:N/A] % Reduction in Volume: 79.10% [2:100.00%] [N/A:N/A] Classification: [1:Grade 2] [2:Grade 2] [N/A:N/A] Exudate Amount: [1:Medium] [2:None Present] [N/A:N/A] Exudate Type: [1:Serosanguineous] [2:N/A] [N/A:N/A] Exudate Color: [1:red, brown] [2:N/A] [N/A:N/A] Wound Margin: [1:Flat and Intact] [2:Flat and Intact] [N/A:N/A] Granulation Amount: [1:Large (67-100%)] [2:None Present (0%)] [N/A:N/A] Granulation Quality: [1:Pink, Pale] [2:N/A] [N/A:N/A] Necrotic Amount: [1:Small (1-33%)] [2:None Present (0%)] [N/A:N/A] Exposed Structures: [1:Fat Layer (Subcutaneous Tissue) Exposed: Yes Fascia: No Tendon: No Muscle: No Joint: No Bone: No Small (1-33%)] [2:Fascia: No Fat Layer (Subcutaneous Tissue) Exposed: No Tendon: No Muscle: No Joint: No Bone: No Large (67-100%)] [N/A:N/A N/A] Treatment Notes Wound #1 (Left Metatarsal head third) 1. Cleanse With Wound Cleanser 3. Primary Dressing Applied Calcium Alginate Ag 4. Secondary  Dressing Dry Gauze Roll Gauze 5. Secured With Recruitment consultant) Signed: 01/25/2020 5:32:11 PM By: Linton Ham MD Entered By: Linton Ham on 01/25/2020 15:22:05 -------------------------------------------------------------------------------- [place] Details Patient Name: Date of Service: [name], [name] 01/25/2020 1:30 PM Medical Record [id number] Patient Account Number: [id number] Date of Birth/Sex: Treating RN: 12-09-1971 (48 y.o. [name]) Carlene Coria Primary Care Rossy Virag: Charlott Rakes Other Clinician: Referring Preciliano Castell: Treating Gianno Volner/Extender:Robson, Sammuel Cooper, Enobong Weeks in Treatment: 2 Active Inactive Wound/Skin Impairment Nursing Diagnoses: Knowledge deficit related to ulceration/compromised skin integrity Goals: Patient/caregiver will verbalize understanding of skin care regimen Date Initiated: 01/11/2020 Target Resolution Date: 02/04/2020 Goal Status: Active Ulcer/skin breakdown will have a volume reduction of 30% by week 4 Date Initiated: 01/11/2020 Target Resolution Date: 02/04/2020 Goal Status: Active Interventions: Assess patient/caregiver ability to obtain necessary supplies Assess patient/caregiver ability to perform ulcer/skin care regimen upon admission and as needed Assess ulceration(s) every visit Notes: Electronic Signature(s) Signed: 01/26/2020 7:19:07 AM By: Carlene Coria RN Entered By: Carlene Coria on 01/25/2020 13:45:17 -------------------------------------------------------------------------------- Pain Assessment Details Patient Name: Date of Service: [name], [name] 01/25/2020 1:30 PM Medical Record [id number] Patient Account Number: [id number] Date of Birth/Sex: Treating RN: Sep 30, 1971 (49 y.o. Janyth Contes Primary Care Artha Chiasson: Charlott Rakes Other Clinician: Referring Zi Sek: Treating Lyrik Dockstader/Extender:Robson, Sammuel Cooper, Charlane Ferretti Weeks in Treatment: 2 Active  Problems Location of Pain Severity and Description of Pain Patient Has Paino No Site Locations Pain Management and Medication Current Pain Management: Electronic Signature(s) Signed: 01/31/2020 6:39:44 PM By: Levan Hurst RN, BSN Entered By: Levan Hurst on 01/25/2020 14:30:27 -------------------------------------------------------------------------------- Patient/Caregiver Education Details Patient Name: Date of Service: [name], [name] 1/26/2021andnbsp1:30 PM Medical Record [id number] Patient Account Number: [id number] Date of Birth/Gender: 1971-11-08 (48 y.o. M) Treating RN: Carlene Coria Primary Care Physician: Charlott Rakes Other Clinician: Referring Physician: Treating Physician/Extender:Robson, Sammuel Cooper, Douglass Rivers in Treatment: 2 Education Assessment Education Provided To: Patient Education Topics Provided Wound/Skin Impairment: Methods: Explain/Verbal Responses: State content correctly Electronic Signature(s) Signed: 01/26/2020 7:19:07 AM By: Carlene Coria RN Entered By: Carlene Coria on 01/25/2020 13:45:39 -------------------------------------------------------------------------------- Wound Assessment Details Patient Name: Date of Service: [name], [name] 01/25/2020 1:30 PM Medical Record [id number] Patient Account Number: [id number] Date of Birth/Sex: Treating RN: 1971-11-06 (48 y.o. [name]) Carlene Coria Primary Care Donnesha Karg: Charlott Rakes Other Clinician: Referring Breahna Boylen: Treating Saina Waage/Extender:Robson, Sammuel Cooper, Enobong Weeks in Treatment: 2 Wound Status Wound Number: 1 Primary Diabetic Wound/Ulcer of the Lower Extremity Etiology: Wound Location: Left Metatarsal head third Wound Open Wounding Event: Trauma Status: Date Acquired: 12/11/2019 Comorbid Congestive Heart Failure, Coronary Artery Weeks Of Treatment: 2 History: Disease, Hypertension, Myocardial Infarction,  Clustered Wound: No Type II Diabetes,  Neuropathy Photos Wound Measurements Length: (cm) 0.7 % Reducti Width: (cm) 0.5 % Reducti Depth: (cm) 0.3 Epithelia Area: (cm) 0.275 Tunnelin Volume: (cm) 0.082 Undermin Wound Description Classification: Grade 2 Wound Margin: Flat and Intact Exudate Amount: Medium Exudate Type: Serosanguineous Exudate Color: red, brown Wound Bed Granulation Amount: Large (67-100%) Granulation Quality: Pink, Pale Necrotic Amount: Small (1-33%) Necrotic Quality: Adherent Slough Foul Odor After Cleansing: No Slough/Fibrino Yes Exposed Structure Fascia Exposed: No Fat Layer (Subcutaneous Tissue) Exposed: Yes Tendon Exposed: No Muscle Exposed: No Joint Exposed: No Bone Exposed: No on in Area: 65% on in Volume: 79.1% lization: Small (1-33%) g: No ing: No Treatment Notes Wound #1 (Left Metatarsal head third) 1. Cleanse With Wound Cleanser 3. Primary Dressing Applied Calcium Alginate Ag 4. Secondary Dressing Dry Gauze Roll Gauze 5. Secured With Secretary/administrator) Signed: 01/26/2020 4:33:34 PM By: Benjaman Kindler EMT/HBOT Signed: 01/26/2020 5:50:55 PM By: [name] RN Previous Signature: 01/26/2020 7:19:07 AM Version By: [name] RN Entered By: Benjaman Kindler on 01/26/2020 13:10:23 -------------------------------------------------------------------------------- Wound Assessment Details Patient Name: Date of Service: [name], [name] 01/25/2020 1:30 PM Medical Record [id number] Patient Account Number: [id number] Date of Birth/Sex: Treating RN: [birth year] (48 y.o. [name]) [name] Primary Care Abdul Beirne: Hoy Register Other Clinician: Referring Lucretia Pendley: Treating Jerryl Holzhauer/Extender:Robson, Gweneth Fritter, Enobong Weeks in Treatment: 2 Wound Status Wound Number: 2 Primary Diabetic Wound/Ulcer of the Lower Extremity Etiology: Wound Location: Left Metatarsal head fifth Wound Healed - Epithelialized Wounding Event: Trauma Status: Date Acquired:  01/04/2020 Comorbid Congestive Heart Failure, Coronary Artery Weeks Of Treatment: 2 History: Disease, Hypertension, Myocardial Infarction, Clustered Wound: No Type II Diabetes, Neuropathy Photos Wound Measurements Length: (cm) 0 % Reducti Width: (cm) 0 % Reducti Depth: (cm) 0 Epithelia Area: (cm) 0 Tunnelin Volume: (cm) 0 Undermin Wound Description Classification: Grade 2 Foul Odor Wound Margin: Flat and Intact Slough/Fi Exudate Amount: None Present Wound Bed Granulation Amount: None Present (0%) Necrotic Amount: None Present (0%) Fascia Ex Fat Layer Tendon Ex Muscle Ex Joint Exp Bone Expo After Cleansing: No brino No Exposed Structure posed: No (Subcutaneous Tissue) Exposed: No posed: No posed: No osed: No sed: No on in Area: 100% on in Volume: 100% lization: Large (67-100%) g: No ing: No Electronic Signature(s) Signed: 01/26/2020 4:33:34 PM By: Benjaman Kindler EMT/HBOT Signed: 01/26/2020 5:50:55 PM By: [name] RN Previous Signature: 01/26/2020 7:19:07 AM Version By: [name] RN Entered By: Benjaman Kindler on 01/26/2020 13:10:52 -------------------------------------------------------------------------------- Vitals Details Patient Name: Date of Service: [name], [name] 01/25/2020 1:30 PM Medical Record [id number] Patient Account Number: [id number] Date of Birth/Sex: Treating RN: 08-03-1971 (49 y.o. [name] Primary Care Malillany Kazlauskas: Hoy Register Other Clinician: Referring Kimbree Casanas: Treating Dovid Bartko/Extender:Robson, Gweneth Fritter, Enobong Weeks in Treatment: 2 Vital Signs Time Taken: 14:20 Temperature (F): 98.6 Height (in): 78 Pulse (bpm): 84 Weight (lbs): 285 Respiratory Rate (breaths/min): 18 Body Mass Index (BMI): 32.9 Blood Pressure (mmHg): 120/84 Reference Range: 80 - 120 mg / dl Electronic Signature(s) Signed: 01/31/2020 6:39:44 PM By: Zandra Abts RN, BSN Entered By: Zandra Abts on 01/25/2020 14:27:51

## 2020-02-01 ENCOUNTER — Other Ambulatory Visit (HOSPITAL_COMMUNITY): Payer: Self-pay

## 2020-02-01 ENCOUNTER — Encounter (HOSPITAL_BASED_OUTPATIENT_CLINIC_OR_DEPARTMENT_OTHER): Payer: Self-pay | Admitting: Internal Medicine

## 2020-02-01 ENCOUNTER — Other Ambulatory Visit (HOSPITAL_COMMUNITY): Payer: Self-pay | Admitting: Cardiology

## 2020-02-01 ENCOUNTER — Other Ambulatory Visit: Payer: Self-pay

## 2020-02-01 DIAGNOSIS — I2510 Atherosclerotic heart disease of native coronary artery without angina pectoris: Secondary | ICD-10-CM | POA: Insufficient documentation

## 2020-02-01 DIAGNOSIS — L97522 Non-pressure chronic ulcer of other part of left foot with fat layer exposed: Secondary | ICD-10-CM | POA: Insufficient documentation

## 2020-02-01 DIAGNOSIS — Z79899 Other long term (current) drug therapy: Secondary | ICD-10-CM | POA: Insufficient documentation

## 2020-02-01 DIAGNOSIS — Z794 Long term (current) use of insulin: Secondary | ICD-10-CM | POA: Insufficient documentation

## 2020-02-01 DIAGNOSIS — I5022 Chronic systolic (congestive) heart failure: Secondary | ICD-10-CM | POA: Insufficient documentation

## 2020-02-01 DIAGNOSIS — I130 Hypertensive heart and chronic kidney disease with heart failure and stage 1 through stage 4 chronic kidney disease, or unspecified chronic kidney disease: Secondary | ICD-10-CM | POA: Insufficient documentation

## 2020-02-01 DIAGNOSIS — E1142 Type 2 diabetes mellitus with diabetic polyneuropathy: Secondary | ICD-10-CM | POA: Insufficient documentation

## 2020-02-01 DIAGNOSIS — E1122 Type 2 diabetes mellitus with diabetic chronic kidney disease: Secondary | ICD-10-CM | POA: Insufficient documentation

## 2020-02-01 DIAGNOSIS — E11621 Type 2 diabetes mellitus with foot ulcer: Secondary | ICD-10-CM | POA: Insufficient documentation

## 2020-02-01 DIAGNOSIS — N189 Chronic kidney disease, unspecified: Secondary | ICD-10-CM | POA: Insufficient documentation

## 2020-02-01 MED ORDER — FUROSEMIDE 40 MG PO TABS: 40.0000 mg | ORAL_TABLET | Freq: Every day | ORAL | 1 refills | Status: DC

## 2020-02-01 MED ORDER — OMEPRAZOLE 20 MG PO CPDR: 20.0000 mg | DELAYED_RELEASE_CAPSULE | Freq: Every day | ORAL | 1 refills | Status: DC

## 2020-02-01 MED ORDER — DIGOXIN 125 MCG PO TABS: 125.0000 ug | ORAL_TABLET | Freq: Every day | ORAL | 0 refills | Status: DC

## 2020-02-01 MED FILL — glipiZIDE 10 MG TABS: 10 | 30 days supply | Qty: 60 | Fill #0

## 2020-02-01 MED FILL — DIGOXIN 0.125 MG TABLET: 125 | 30 days supply | Qty: 30 | Fill #0

## 2020-02-01 MED FILL — OMEPRAZOLE 20 MG CAPSULE DR: 20 | 30 days supply | Qty: 30 | Fill #0

## 2020-02-01 MED FILL — FUROSEMIDE 40 MG TAB: 40 | 30 days supply | Qty: 30 | Fill #0

## 2020-02-01 NOTE — Progress Notes (Signed)
Glenn, [name] ([id number]) Visit Report for 02/01/2020 Debridement Details Patient Name: Date of Service: [name], [name] 02/01/2020 9:45 AM Medical Record [id number] Patient Account Number: [id number] Date of Birth/Sex: Treating RN: 11-05-1971 (48 y.o. [name]) Carlene Coria Primary Care Provider: Charlott Rakes Other Clinician: Referring Provider: Treating Provider/Extender:Alexandria Shiflett, Sammuel Cooper, Charlane Ferretti Weeks in Treatment: 3 Debridement Performed for Wound #1 Left Metatarsal head third Assessment: Performed By: Physician Ricard Dillon., MD Debridement Type: Debridement Severity of Tissue Pre Fat layer exposed Debridement: Level of Consciousness (Pre- Awake and Alert procedure): Pre-procedure Verification/Time Out Taken: Yes - 10:53 Start Time: 10:53 Pain Control: Lidocaine 5% topical ointment Total Area Debrided (L x W): 0.6 (cm) x 0.4 (cm) = 0.24 (cm) Tissue and other material Viable, Non-Viable, Callus, Subcutaneous debrided: Level: Skin/Subcutaneous Tissue Debridement Description: Excisional Instrument: Curette Bleeding: Moderate Hemostasis Achieved: Pressure End Time: 10:56 Procedural Pain: 0 Post Procedural Pain: 0 Response to Treatment: Procedure was tolerated well Level of Consciousness Awake and Alert (Post-procedure): Post Debridement Measurements of Total Wound Length: (cm) 0.6 Width: (cm) 0.4 Depth: (cm) 0.6 Volume: (cm) 0.113 Character of Wound/Ulcer Post Improved Debridement: Severity of Tissue Post Debridement: Fat layer exposed Post Procedure Diagnosis Same as Pre-procedure Electronic Signature(s) Signed: 02/01/2020 5:22:39 PM By: Linton Ham MD Signed: 02/01/2020 5:23:18 PM By: Carlene Coria RN Entered By: Carlene Coria on 02/01/2020 10:55:38 -------------------------------------------------------------------------------- HPI Details Patient Name: Date of Service: [name], [name] 02/01/2020 9:45 AM Medical Record [id number]  Patient Account Number: [id number] Date of Birth/Sex: Treating RN: 1971-07-31 (48 y.o. [name] Primary Care Provider: Charlott Rakes Other Clinician: Referring Provider: Treating Provider/Extender:Kiyani Jernigan, Sammuel Cooper, Charlane Ferretti Weeks in Treatment: 3 History of Present Illness HPI Description: ADMISSION 01/11/2020 This is a 49 year old man with uncontrolled type 2 diabetes with a recent hemoglobin A1c earlier this year of 13.4. He is on insulin and glipizide. He does not take his blood sugars at home. He does have a follow-up with primary care later this month I believe on January 27. He tells [place] that roughly a month ago he was walking with a shoe with a hole in his foot. He took the shoe off and there was an open wound at roughly the left fourth met head. This has significant undermining and raised edges. He has not noticed any purulence he does not feel unwell. More recently he was taking skin off the bottom of his foot and has a superficial area on the left fifth met head. He has not been offloading this. The patient was in the ER on 12/20. They gave him Bactroban which she has been using on the wound and 10 days worth of doxycycline. No x-rays were done. He has not had vascular studies. He is also been using hydrogen peroxide. Past medical history type 2 diabetes uncontrolled, chronic systolic heart failure, coronary artery disease with a history of congestive heart failure with stents. Hypertension hyperlipidemia and chronic renal insufficiency ABI in this clinic was 1.14 on the left. 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 He is uncertain whether he would be able to work if we put him on some form of restriction 1/19; he is generally doing quite well. Using silver alginate on the wounds. Things actually look better. He has a forefoot offloading boot which she seems to be compliant about. He has support at work to stay off his foot is much as  possible which is gratifying. Culture I did last week showed a few Enterococcus faecalis. I am going to  put him on Augmentin. I talked about ordering an x-ray in my note last week but that does not seem to have happened. We will review reorder the x-ray this week. 1/26; x-ray reordered last week was negative for osteomyelitis. We are using silver alginate on the wound on the third and fifth met heads on the left. He is using a Darco forefoot off loader 2/2; the area on the fifth met head is closed. Third met head is still open with tunneling depth and thick callus. Electronic Signature(s) Signed: 02/01/2020 5:22:39 PM By: Linton Ham MD Entered By: Linton Ham on 02/01/2020 12:48:24 -------------------------------------------------------------------------------- Physical Exam Details Patient Name: Date of Service: [name], [name] 02/01/2020 9:45 AM Medical Record [id number] Patient Account Number: [id number] Date of Birth/Sex: Treating RN: Oct 31, 1971 (48 y.o. [name] Primary Care Provider: Charlott Rakes Other Clinician: Referring Provider: Treating Provider/Extender:Jalisa Sacco, Sammuel Cooper, Enobong Weeks in Treatment: 3 Constitutional Patient is hypotensive.. Pulse regular and within target range for patient.[name] Respirations regular, non-labored and within target range.. Temperature is normal and within the target range for the patient.[name] Appears in no distress. Notes Wound exam The area over the fifth met head is closed. Again thick rolled callus around a punched out wound area. I removed the callus debrided from the wound surface. The wound actually looks quite good this is clearly an offloading issue in this area. This does not go down to bone there was no evidence of infection Electronic Signature(s) Signed: 02/01/2020 5:22:39 PM By: Linton Ham MD Entered By: Linton Ham on 02/01/2020  12:49:39 -------------------------------------------------------------------------------- Physician Orders Details Patient Name: Date of Service: [name], [name] 02/01/2020 9:45 AM Medical Record [id number] Patient Account Number: [id number] Date of Birth/Sex: Treating RN: [birth year] (48 y.o. [name]) Carlene Coria Primary Care Provider: Charlott Rakes Other Clinician: Referring Provider: Treating Provider/Extender:Khilee Hendricksen, Sammuel Cooper, Douglass Rivers in Treatment: 3 Verbal / Phone Orders: No Diagnosis Coding ICD-10 Coding Code Description E11.621 Type 2 diabetes mellitus with foot ulcer L97.522 Non-pressure chronic ulcer of other part of left foot with fat layer exposed L97.521 Non-pressure chronic ulcer of other part of left foot limited to breakdown of skin E11.42 Type 2 diabetes mellitus with diabetic polyneuropathy Follow-up Appointments Return Appointment in 1 week. Dressing Change Frequency Change dressing every day. Wound Cleansing May shower and wash wound with soap and water. Primary Wound Dressing Wound #1 Left Metatarsal head third Calcium Alginate with Silver Secondary Dressing Wound #1 Left Metatarsal head third Kerlix/Rolled Gauze - secure with tape Dry Gauze Off-Loading Other: - front off loading shoe Electronic Signature(s) Signed: 02/01/2020 5:22:39 PM By: Linton Ham MD Signed: 02/01/2020 5:23:18 PM By: Carlene Coria RN Entered By: Carlene Coria on 02/01/2020 11:05:15 -------------------------------------------------------------------------------- Problem List Details Patient Name: Date of Service: [name], [name] 02/01/2020 9:45 AM Medical Record [id number] Patient Account Number: [id number] Date of Birth/Sex: Treating RN: 12-Jan-1971 (48 y.o. [name] Primary Care Provider: Charlott Rakes Other Clinician: Referring Provider: Treating Provider/Extender:Reginal Wojcicki, Sammuel Cooper, Charlane Ferretti Weeks in Treatment: 3 Active  Problems ICD-10 Evaluated Encounter Code Description Active Date Today Diagnosis E11.621 Type 2 diabetes mellitus with foot ulcer 01/11/2020 No Yes L97.522 Non-pressure chronic ulcer of other part of left foot 01/11/2020 No Yes with fat layer exposed L97.521 Non-pressure chronic ulcer of other part of left foot 01/11/2020 No Yes limited to breakdown of skin E11.42 Type 2 diabetes mellitus with diabetic polyneuropathy 01/11/2020 No Yes Inactive Problems Resolved Problems Electronic Signature(s) Signed: 02/01/2020 5:22:39 PM By: Linton Ham MD Entered By: Linton Ham on 02/01/2020 12:47:05 -------------------------------------------------------------------------------- Progress  Note Details Patient Name: Date of Service: [name], [name] 02/01/2020 9:45 AM Medical Record [id number] Patient Account Number: [id number] Date of Birth/Sex: Treating RN: [birth year] (48 y.o. [name]) Carlene Coria Primary Care Provider: Charlott Rakes Other Clinician: Referring Provider: Treating Provider/Extender:Marta Bouie, Sammuel Cooper, Charlane Ferretti Weeks in Treatment: 3 Subjective History of Present Illness (HPI) ADMISSION 01/11/2020 This is a 49 year old man with uncontrolled type 2 diabetes with a recent hemoglobin A1c earlier this year of 13.4. He is on insulin and glipizide. He does not take his blood sugars at home. He does have a follow-up with primary care later this month I believe on January 27. He tells [place] that roughly a month ago he was walking with a shoe with a hole in his foot. He took the shoe off and there was an open wound at roughly the left fourth met head. This has significant undermining and raised edges. He has not noticed any purulence he does not feel unwell. More recently he was taking skin off the bottom of his foot and has a superficial area on the left fifth met head. He has not been offloading this. The patient was in the ER on 12/20. They gave him Bactroban which she has been  using on the wound and 10 days worth of doxycycline. No x-rays were done. He has not had vascular studies. He is also been using hydrogen peroxide. Past medical history type 2 diabetes uncontrolled, chronic systolic heart failure, coronary artery disease with a history of congestive heart failure with stents. Hypertension hyperlipidemia and chronic renal insufficiency ABI in this clinic was 1.14 on the left. 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 He is uncertain whether he would be able to work if we put him on some form of restriction 1/19; he is generally doing quite well. Using silver alginate on the wounds. Things actually look better. He has a forefoot offloading boot which she seems to be compliant about. He has support at work to stay off his foot is much as possible which is gratifying. Culture I did last week showed a few Enterococcus faecalis. I am going to put him on Augmentin. I talked about ordering an x-ray in my note last week but that does not seem to have happened. We will review reorder the x-ray this week. 1/26; x-ray reordered last week was negative for osteomyelitis. We are using silver alginate on the wound on the third and fifth met heads on the left. He is using a Darco forefoot off loader 2/2; the area on the fifth met head is closed. Third met head is still open with tunneling depth and thick callus. Objective Constitutional Patient is hypotensive.. Pulse regular and within target range for patient.[name] Respirations regular, non-labored and within target range.. Temperature is normal and within the target range for the patient.[name] Appears in no distress. Vitals Time Taken: 10:30 AM, Height: 78 in, Weight: 285 lbs, BMI: 32.9, Temperature: 98.3 F, Pulse: 89 bpm, Respiratory Rate: 18 breaths/min, Blood Pressure: 98/63 mmHg. General Notes: Wound exam ooThe area over the fifth met head is closed. Again thick rolled callus around a punched out  wound area. I removed the callus debrided from the wound surface. The wound actually looks quite good this is clearly an offloading issue in this area. This does not go down to bone there was no evidence of infection Integumentary (Hair, Skin) Wound #1 status is Open. Original cause of wound was Trauma. The wound is located on the Left  Metatarsal head third. The wound measures 0.6cm length x 0.4cm width x 0.6cm depth; 0.188cm^2 area and 0.113cm^3 volume. There is Fat Layer (Subcutaneous Tissue) Exposed exposed. There is no tunneling or undermining noted. There is a small amount of serosanguineous drainage noted. The wound margin is flat and intact. There is large (67-100%) pink, pale granulation within the wound bed. There is a small (1-33%) amount of necrotic tissue within the wound bed including Adherent Slough. 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 There was a Excisional Skin/Subcutaneous Tissue Debridement with a total area of 0.24 sq cm performed by Ricard Dillon., MD. With the following instrument(s): Curette to remove Viable and Non-Viable tissue/material. Material removed includes Callus and Subcutaneous Tissue and after achieving pain control using Lidocaine 5% topical ointment. No specimens were taken. A time out was conducted at 10:53, prior to the start of the procedure. A Moderate amount of bleeding was controlled with Pressure. The procedure was tolerated well with a pain level of 0 throughout and a pain level of 0 following  the procedure. Post Debridement Measurements: 0.6cm length x 0.4cm width x 0.6cm depth; 0.113cm^3 volume. Character of Wound/Ulcer Post Debridement is improved. Severity of Tissue Post Debridement is: Fat layer exposed. Post procedure Diagnosis Wound #1: Same as Pre-Procedure Plan Follow-up Appointments: Return Appointment in 1 week. Dressing Change Frequency: Change dressing every day. Wound Cleansing: May shower and wash wound with soap and water. Primary Wound Dressing: Wound #1 Left Metatarsal head third: Calcium Alginate with Silver Secondary Dressing: Wound #1 Left Metatarsal head third: Kerlix/Rolled Gauze - secure with tape Dry Gauze Off-Loading: Other: - front off loading shoe 1. Left third metatarsal head. We are using silver alginate 2. 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 I told him to talk to his superiors about this. I do not think he is able to adequately offload this particular area in the forefoot off loader Electronic Signature(s) Signed: 02/01/2020 5:22:39 PM By: Linton Ham MD Entered By: Linton Ham on 02/01/2020 12:51:05 -------------------------------------------------------------------------------- SuperBill Details Patient Name: Date of Service: [name], [name] 02/01/2020 Medical Record [id number] Patient Account Number: [id number] Date of Birth/Sex: Treating RN: [birth year] (48 y.o. [name]) Dolores Lory, Morey Hummingbird Primary Care Provider: Charlott Rakes Other Clinician: Referring Provider: Treating Provider/Extender:Mica Ramdass, Sammuel Cooper, Enobong Weeks in Treatment: 3 Diagnosis Coding ICD-10 Codes Code Description E11.621 Type 2 diabetes mellitus with foot ulcer L97.522 Non-pressure chronic ulcer of other part of left foot with fat layer exposed L97.521 Non-pressure chronic ulcer of other part of left foot limited to  breakdown of skin E11.42 Type 2 diabetes mellitus with diabetic polyneuropathy Facility Procedures CPT4 Code Description: [id number] - DEB SUBQ TISSUE 20 SQ CM/< ICD-10 Diagnosis Description L97.522 Non-pressure chronic ulcer of other part of left foot wi Modifier: th fat layer ex Quantity: 1 posed Physician Procedures CPT4 Code Description: [id number] - WC PHYS SUBQ TISS 20 SQ CM ICD-10 Diagnosis Description L97.522 Non-pressure chronic ulcer of other  part of left foot wi Modifier: th fat layer exp Quantity: 1 osed Electronic Signature(s) Signed: 02/01/2020 5:22:39 PM By: Linton Ham MD Entered By: Linton Ham on 02/01/2020 12:51:19

## 2020-02-04 ENCOUNTER — Other Ambulatory Visit (HOSPITAL_COMMUNITY): Payer: Self-pay | Admitting: *Deleted

## 2020-02-04 ENCOUNTER — Other Ambulatory Visit (HOSPITAL_COMMUNITY): Payer: Self-pay | Admitting: Cardiology

## 2020-02-04 DIAGNOSIS — I509 Heart failure, unspecified: Secondary | ICD-10-CM

## 2020-02-04 MED ORDER — CARVEDILOL 6.25 MG PO TABS: 6.2500 mg | ORAL_TABLET | Freq: Two times a day (BID) | ORAL | 3 refills | Status: DC

## 2020-02-04 MED ORDER — ATORVASTATIN CALCIUM 80 MG PO TABS: 80.0000 mg | ORAL_TABLET | Freq: Every day | ORAL | 3 refills | Status: DC

## 2020-02-04 MED ORDER — OMEPRAZOLE 20 MG PO CPDR: 20.0000 mg | DELAYED_RELEASE_CAPSULE | Freq: Every day | ORAL | 3 refills | Status: DC

## 2020-02-04 MED ORDER — FUROSEMIDE 40 MG PO TABS: 40.0000 mg | ORAL_TABLET | Freq: Every day | ORAL | 3 refills | Status: DC

## 2020-02-04 MED ORDER — DIGOXIN 125 MCG PO TABS: 125.0000 ug | ORAL_TABLET | Freq: Every day | ORAL | 3 refills | Status: DC

## 2020-02-04 MED ORDER — SPIRONOLACTONE 50 MG PO TABS: 50.0000 mg | ORAL_TABLET | Freq: Every day | ORAL | 3 refills | Status: DC

## 2020-02-04 MED FILL — ATORVASTATIN 80 MG TABLET: 80 | 30 days supply | Qty: 30 | Fill #0

## 2020-02-04 MED FILL — SPIRONOLACTONE 50 MG TABLET: 50 | 30 days supply | Qty: 30 | Fill #0

## 2020-02-04 MED FILL — CARVEDILOL 6.25 MG TABLET: 6.25 | 30 days supply | Qty: 60 | Fill #0

## 2020-02-08 ENCOUNTER — Other Ambulatory Visit: Payer: Self-pay

## 2020-02-08 ENCOUNTER — Encounter (HOSPITAL_BASED_OUTPATIENT_CLINIC_OR_DEPARTMENT_OTHER): Payer: Self-pay | Attending: Internal Medicine | Admitting: Internal Medicine

## 2020-02-08 NOTE — Progress Notes (Signed)
RASAAN, BROTHERTON ([id number]) Visit Report for 02/08/2020 Debridement Details Patient Name: Date of Service: KODEE, RAVERT 02/08/2020 10:45 AM Medical Record [id number] Patient Account Number: [id number] Date of Birth/Sex: 05-Feb-1971 (49 y.o. M) Treating RN: Carlene Coria Primary Care Provider: Charlott Rakes Other Clinician: Referring Provider: Treating Provider/Extender:Evann Koelzer, Sammuel Cooper, Charlane Ferretti Weeks in Treatment: 4 Debridement Performed for Wound #1 Left Metatarsal head third Assessment: Performed By: Physician Ricard Dillon., MD Debridement Type: Debridement Severity of Tissue Pre Fat layer exposed Debridement: Level of Consciousness (Pre- Awake and Alert procedure): Pre-procedure Verification/Time Out Taken: Yes - 12:12 Start Time: 12:12 Pain Control: Other : benzocaine 20% Total Area Debrided (L x W): 0.2 (cm) x 0.3 (cm) = 0.06 (cm) Tissue and other material Viable, Non-Viable, Callus, Subcutaneous debrided: Level: Skin/Subcutaneous Tissue Debridement Description: Excisional Instrument: Curette Bleeding: Moderate Hemostasis Achieved: Pressure End Time: 12:15 Procedural Pain: 0 Post Procedural Pain: 0 Response to Treatment: Procedure was tolerated well Level of Consciousness Awake and Alert (Post-procedure): Post Debridement Measurements of Total Wound Length: (cm) 0.2 Width: (cm) 0.3 Depth: (cm) 0.3 Volume: (cm) 0.014 Character of Wound/Ulcer Post Improved Debridement: Severity of Tissue Post Debridement: Fat layer exposed Post Procedure Diagnosis Same as Pre-procedure Electronic Signature(s) Signed: 02/08/2020 5:08:08 PM By: Linton Ham MD Signed: 02/08/2020 5:19:34 PM By: Carlene Coria RN Entered By: Linton Ham on 02/08/2020 13:08:34 -------------------------------------------------------------------------------- HPI Details Patient Name: Date of Service: TYJAI, MATUSZAK 02/08/2020 10:45 AM Medical Record [id number]  Patient Account Number: [id number] Date of Birth/Sex: Treating RN: 12-31-1970 (48 y.o. [name] Primary Care Provider: Charlott Rakes Other Clinician: Referring Provider: Treating Provider/Extender:Robert Sunga, Sammuel Cooper, Enobong Weeks in Treatment: 4 History of Present Illness HPI Description: ADMISSION 01/11/2020 This is a 49 year old man with uncontrolled type 2 diabetes with a recent hemoglobin A1c earlier this year of 13.4. He is on insulin and glipizide. He does not take his blood sugars at home. He does have a follow-up with primary care later this month I believe on January 27. He tells [place] that roughly a month ago he was walking with a shoe with a hole in his foot. He took the shoe off and there was an open wound at roughly the left fourth met head. This has significant undermining and raised edges. He has not noticed any purulence he does not feel unwell. More recently he was taking skin off the bottom of his foot and has a superficial area on the left fifth met head. He has not been offloading this. The patient was in the ER on 12/20. They gave him Bactroban which she has been using on the wound and 10 days worth of doxycycline. No x-rays were done. He has not had vascular studies. He is also been using hydrogen peroxide. Past medical history type 2 diabetes uncontrolled, chronic systolic heart failure, coronary artery disease with a history of congestive heart failure with stents. Hypertension hyperlipidemia and chronic renal insufficiency ABI in this clinic was 1.14 on the left. 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 He is uncertain whether he would be able to work if we put him on some form of restriction 1/19; he is generally doing quite well. Using silver alginate on the wounds. Things actually look better. He has a forefoot offloading boot which she seems to be compliant about. He has support at work to stay off his foot is much as  possible which is gratifying. Culture I did last week showed a few Enterococcus faecalis. I am going to  put him on Augmentin. I talked about ordering an x-ray in my note last week but that does not seem to have happened. We will review reorder the x-ray this week. 1/26; x-ray reordered last week was negative for osteomyelitis. We are using silver alginate on the wound on the third and fifth met heads on the left. He is using a Darco forefoot off loader 2/2; the area on the fifth met head is closed. Third met head is still open with tunneling depth and thick callus. 2/9; the area on the fifth met head remains closed however the third met head again has a small open area on presentation with tunneling in depth and surrounded by thick callus. This looks like a pressure issue. We have been using silver alginate Electronic Signature(s) Signed: 02/08/2020 5:08:08 PM By: Linton Ham MD Entered By: Linton Ham on 02/08/2020 13:09:33 -------------------------------------------------------------------------------- Physical Exam Details Patient Name: Date of Service: ESLEY, BROOKING 02/08/2020 10:45 AM Medical Record [id number] Patient Account Number: [id number] Date of Birth/Sex: Treating RN: 04/25/1971 (48 y.o. [name] Primary Care Provider: Charlott Rakes Other Clinician: Referring Provider: Treating Provider/Extender:Louay Myrie, Sammuel Cooper, Enobong Weeks in Treatment: 4 Constitutional Sitting or standing Blood Pressure is within target range for patient.. Pulse regular and within target range for patient.[name] Respirations regular, non-labored and within target range.. Temperature is normal and within the target range for the patient.[name] Appears in no distress. Notes Wound exam The area over the fifth met head is closed. Again the third met head wound has thick rolled callus around a small punched out area this has undermining and depth. Once again using a #3 curette I removed  callus subcutaneous debris. The wound looks healthy but it has depth it does not go down to bone Electronic Signature(s) Signed: 02/08/2020 5:08:08 PM By: Linton Ham MD Entered By: Linton Ham on 02/08/2020 13:10:20 -------------------------------------------------------------------------------- Physician Orders Details Patient Name: Date of Service: PATTERSON, HOLLENBAUGH 02/08/2020 10:45 AM Medical Record [id number] Patient Account Number: [id number] Date of Birth/Sex: Treating RN: 16-Apr-1971 (48 y.o. [name]) Dolores Lory, Morey Hummingbird Primary Care Provider: Charlott Rakes Other Clinician: Referring Provider: Treating Provider/Extender:Alica Shellhammer, Sammuel Cooper, Douglass Rivers in Treatment: 4 Verbal / Phone Orders: No Diagnosis Coding ICD-10 Coding Code Description E11.621 Type 2 diabetes mellitus with foot ulcer L97.522 Non-pressure chronic ulcer of other part of left foot with fat layer exposed L97.521 Non-pressure chronic ulcer of other part of left foot limited to breakdown of skin E11.42 Type 2 diabetes mellitus with diabetic polyneuropathy Follow-up Appointments Return Appointment in 1 week. Dressing Change Frequency Change dressing every day. Wound Cleansing May shower and wash wound with soap and water. Primary Wound Dressing Wound #1 Left Metatarsal head third Hydrofera Blue Secondary Dressing Wound #1 Left Metatarsal head third Kerlix/Rolled Gauze - secure with tape Dry Gauze Off-Loading Other: - front off loading shoe with felt to shoe Electronic Signature(s) Signed: 02/08/2020 5:08:08 PM By: Linton Ham MD Signed: 02/08/2020 5:19:34 PM By: Carlene Coria RN Entered By: Carlene Coria on 02/08/2020 12:15:25 -------------------------------------------------------------------------------- Problem List Details Patient Name: Date of Service: GAREK, SCHUNEMAN 02/08/2020 10:45 AM Medical Record [id number] Patient Account Number: [id number] Date of Birth/Sex: Treating  RN: 21-Feb-1971 (48 y.o. [name] Primary Care Provider: Charlott Rakes Other Clinician: Referring Provider: Treating Provider/Extender:Lashunda Greis, Sammuel Cooper, Enobong Weeks in Treatment: 4 Active Problems ICD-10 Evaluated Encounter Code Description Active Date Today Diagnosis E11.621 Type 2 diabetes mellitus with foot ulcer 01/11/2020 No Yes L97.522 Non-pressure chronic ulcer of other part of left foot 01/11/2020 No Yes with fat  layer exposed L97.521 Non-pressure chronic ulcer of other part of left foot 01/11/2020 No Yes limited to breakdown of skin E11.42 Type 2 diabetes mellitus with diabetic polyneuropathy 01/11/2020 No Yes Inactive Problems Resolved Problems Electronic Signature(s) Signed: 02/08/2020 5:08:08 PM By: Linton Ham MD Entered By: Linton Ham on 02/08/2020 13:07:01 -------------------------------------------------------------------------------- Progress Note Details Patient Name: Date of Service: VERNARD, GRAM 02/08/2020 10:45 AM Medical Record [id number] Patient Account Number: [id number] Date of Birth/Sex: Treating RN: [birth year] (48 y.o. [name] Primary Care Provider: Charlott Rakes Other Clinician: Referring Provider: Treating Provider/Extender:Cherylene Ferrufino, Sammuel Cooper, Enobong Weeks in Treatment: 4 Subjective History of Present Illness (HPI) ADMISSION 01/11/2020 This is a 49 year old man with uncontrolled type 2 diabetes with a recent hemoglobin A1c earlier this year of 13.4. He is on insulin and glipizide. He does not take his blood sugars at home. He does have a follow-up with primary care later this month I believe on January 27. He tells [place] that roughly a month ago he was walking with a shoe with a hole in his foot. He took the shoe off and there was an open wound at roughly the left fourth met head. This has significant undermining and raised edges. He has not noticed any purulence he does not feel unwell. More recently he  was taking skin off the bottom of his foot and has a superficial area on the left fifth met head. He has not been offloading this. The patient was in the ER on 12/20. They gave him Bactroban which she has been using on the wound and 10 days worth of doxycycline. No x-rays were done. He has not had vascular studies. He is also been using hydrogen peroxide. Past medical history type 2 diabetes uncontrolled, chronic systolic heart failure, coronary artery disease with a history of congestive heart failure with stents. Hypertension hyperlipidemia and chronic renal insufficiency ABI in this clinic was 1.14 on the left. 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 He is uncertain whether he would be able to work if we put him on some form of restriction 1/19; he is generally doing quite well. Using silver alginate on the wounds. Things actually look better. He has a forefoot offloading boot which she seems to be compliant about. He has support at work to stay off his foot is much as possible which is gratifying. Culture I did last week showed a few Enterococcus faecalis. I am going to put him on Augmentin. I talked about ordering an x-ray in my note last week but that does not seem to have happened. We will review reorder the x-ray this week. 1/26; x-ray reordered last week was negative for osteomyelitis. We are using silver alginate on the wound on the third and fifth met heads on the left. He is using a Darco forefoot off loader 2/2; the area on the fifth met head is closed. Third met head is still open with tunneling depth and thick callus. 2/9; the area on the fifth met head remains closed however the third met head again has a small open area on presentation with tunneling in depth and surrounded by thick callus. This looks like a pressure issue. We have been using silver alginate Objective Constitutional Sitting or standing Blood Pressure is within target range for  patient.. Pulse regular and within target range for patient.[name] Respirations regular, non-labored and within target range.. Temperature is normal and within the target range for the patient.[name] Appears in no distress. Vitals Time  Taken: 11:57 AM, Height: 78 in, Weight: 285 lbs, BMI: 32.9, Temperature: 98.3 F, Pulse: 84 bpm, Respiratory Rate: 18 breaths/min, Blood Pressure: 102/67 mmHg. General Notes: Wound exam ooThe area over the fifth met head is closed. Again the third met head wound has thick rolled callus around a small punched out area this has undermining and depth. Once again using a #3 curette I removed callus subcutaneous debris. The wound looks healthy but it has depth it does not go down to bone Integumentary (Hair, Skin) Wound #1 status is Open. Original cause of wound was Trauma. The wound is located on the Left Metatarsal head third. The wound measures 0.2cm length x 0.3cm width x 0.3cm depth; 0.047cm^2 area and 0.014cm^3 volume. There is Fat Layer (Subcutaneous Tissue) Exposed exposed. There is no tunneling noted, however, there is undermining starting at 3:00 and ending at 7:00 with a maximum distance of 0.3cm. There is a small amount of serosanguineous drainage noted. The wound margin is flat and intact. There is large (67-100%) pink, pale granulation within the wound bed. There is a small (1-33%) amount of necrotic tissue within the wound bed including Adherent Slough. 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 There was a  Excisional Skin/Subcutaneous Tissue Debridement with a total area of 0.06 sq cm performed by Ricard Dillon., MD. With the following instrument(s): Curette to remove Viable and Non-Viable tissue/material. Material removed includes Callus and Subcutaneous Tissue and after achieving pain control using Other (benzocaine 20%). No specimens were taken. A time out was conducted at 12:12, prior to the start of the procedure. A Moderate amount of bleeding was controlled with Pressure. The procedure was tolerated well with a pain level of 0 throughout and a pain level of 0 following the procedure. Post Debridement Measurements: 0.2cm length x 0.3cm width x 0.3cm depth; 0.014cm^3 volume. Character of Wound/Ulcer Post Debridement is improved. Severity of Tissue Post Debridement is: Fat layer exposed. Post procedure Diagnosis Wound #1: Same as Pre-Procedure Plan Follow-up Appointments: Return Appointment in 1 week. Dressing Change Frequency: Change dressing every day. Wound Cleansing: May shower and wash wound with soap and water. Primary Wound Dressing: Wound #1 Left Metatarsal head third: Hydrofera Blue Secondary Dressing: Wound #1 Left Metatarsal head third: Kerlix/Rolled Gauze - secure with tape Dry Gauze Off-Loading: Other: - front off loading shoe with felt to shoe 1. I change the dressing to Hydrofera Blue 2. He has a offloading sandal we are going to try to further offload him with felt in the shoe and on and around the wound. 3. I saw no evidence of infection this looks like an offloading issue Electronic Signature(s) Signed: 02/08/2020 5:08:08 PM By: Linton Ham MD Entered By: Linton Ham on 02/08/2020 13:11:04 -------------------------------------------------------------------------------- SuperBill Details Patient Name: Date of Service: AMER, ALCINDOR 02/08/2020 Medical Record [id number] Patient Account Number: [id number] Date of Birth/Sex: Treating  RN: [birth year] (48 y.o. [name] Primary Care Provider: Charlott Rakes Other Clinician: Referring Provider: Treating Provider/Extender:Emmarae Cowdery, Sammuel Cooper, Enobong Weeks in Treatment: 4 Diagnosis Coding ICD-10 Codes Code Description E11.621 Type 2 diabetes mellitus with foot ulcer L97.522  Non-pressure chronic ulcer of other part of left foot with fat layer exposed L97.521 Non-pressure chronic ulcer of other part of left foot limited to breakdown of skin E11.42 Type 2 diabetes mellitus with diabetic polyneuropathy Facility Procedures CPT4 Code Description: [id number] 11042 - DEB SUBQ TISSUE 20 SQ CM/< ICD-10 Diagnosis Description L97.522 Non-pressure chronic ulcer of other part of left foot with Modifier: fat layer ex Quantity: 1 posed Physician Procedures CPT4 Code Description: [id number] 11042 - WC PHYS SUBQ TISS 20 SQ CM ICD-10 Diagnosis Description L97.522 Non-pressure chronic ulcer of other part of left foot with Modifier: fat layer exp Quantity: 1 osed Electronic Signature(s) Signed: 02/08/2020 5:08:08 PM By: Linton Ham MD Entered By: Linton Ham on 02/08/2020 13:11:22

## 2020-02-11 ENCOUNTER — Telehealth (HOSPITAL_COMMUNITY): Payer: Self-pay | Admitting: Pharmacy Technician

## 2020-02-11 NOTE — Telephone Encounter (Signed)
Spoke to patient regarding re-enrollment of [employer] in [employer]. Will take the application for the patient to sign to check in desk. Patient has no insurance, after obtaining his signature, it can be faxed in.  Will follow up.

## 2020-02-15 ENCOUNTER — Encounter (HOSPITAL_BASED_OUTPATIENT_CLINIC_OR_DEPARTMENT_OTHER): Payer: Self-pay | Admitting: Internal Medicine

## 2020-02-15 ENCOUNTER — Other Ambulatory Visit: Payer: Self-pay

## 2020-02-16 NOTE — Progress Notes (Signed)
[name], [name] ([id number]) Visit Report for 02/15/2020 Debridement Details Patient Name: Date of Service: [name], [name] 02/15/2020 9:15 AM Medical Record [id number] Patient Account Number: [id number] Date of Birth/Sex: April 06, 1971 (49 y.o. M) Treating RN: Carlene Coria Primary Care Provider: Charlott Rakes Other Clinician: Referring Provider: Treating Provider/Extender:Pleasant Britz, Sammuel Cooper, Enobong Weeks in Treatment: 5 Debridement Performed for Wound #1 Left Metatarsal head third Assessment: Performed By: Physician Ricard Dillon., [name] Debridement Type: Debridement Severity of Tissue Pre Fat layer exposed Debridement: Level of Consciousness (Pre- Awake and Alert procedure): Pre-procedure Verification/Time Out Taken: Yes - 10:38 Start Time: 10:38 Pain Control: Other : benzocaine 20% Total Area Debrided (L x W): 0.3 (cm) x 0.2 (cm) = 0.06 (cm) Tissue and other material Viable, Non-Viable, Slough, Subcutaneous, Skin: Dermis , Skin: Epidermis, Slough debrided: Level: Skin/Subcutaneous Tissue Debridement Description: Excisional Instrument: Curette Bleeding: Moderate Hemostasis Achieved: Pressure End Time: 10:39 Procedural Pain: 2 Post Procedural Pain: 0 Response to Treatment: Procedure was tolerated well Level of Consciousness Awake and Alert (Post-procedure): Post Debridement Measurements of Total Wound Length: (cm) 0.3 Width: (cm) 0.2 Depth: (cm) 0.3 Volume: (cm) 0.014 Character of Wound/Ulcer Post Improved Debridement: Severity of Tissue Post Debridement: Fat layer exposed Post Procedure Diagnosis Same as Pre-procedure Electronic Signature(s) Signed: 02/15/2020 5:18:57 PM By: Linton Ham [name] Signed: 02/16/2020 5:45:19 PM By: Carlene Coria RN Entered By: Linton Ham on 02/15/2020 11:04:26 -------------------------------------------------------------------------------- HPI Details Patient Name: Date of Service: [name], [name] 02/15/2020 9:15  AM Medical Record [id number] Patient Account Number: [id number] Date of Birth/Sex: Treating RN: 08/16/1971 (48 y.o. [name] Primary Care Provider: Charlott Rakes Other Clinician: Referring Provider: Treating Provider/Extender:Abubakar Crispo, Sammuel Cooper, Enobong Weeks in Treatment: 5 History of Present Illness HPI Description: ADMISSION 01/11/2020 This is a 49 year old man with uncontrolled type 2 diabetes with a recent hemoglobin A1c earlier this year of 13.4. He is on insulin and glipizide. He does not take his blood sugars at home. He does have a follow-up with primary care later this month I believe on January 27. He tells [place] that roughly a month ago he was walking with a shoe with a hole in his foot. He took the shoe off and there was an open wound at roughly the left fourth met head. This has significant undermining and raised edges. He has not noticed any purulence he does not feel unwell. More recently he was taking skin off the bottom of his foot and has a superficial area on the left fifth met head. He has not been offloading this. The patient was in the ER on 12/20. They gave him Bactroban which she has been using on the wound and 10 days worth of doxycycline. No x-rays were done. He has not had vascular studies. He is also been using hydrogen peroxide. Past medical history type 2 diabetes uncontrolled, chronic systolic heart failure, coronary artery disease with a history of congestive heart failure with stents. Hypertension hyperlipidemia and chronic renal insufficiency ABI in this clinic was 1.14 on the left. 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 He is uncertain whether he would be able to work if we put him on some form of restriction 1/19; he is generally doing quite well. Using silver alginate on the wounds. Things actually look better. He has a forefoot offloading boot which she seems to be compliant about. He has support at work  to stay off his foot is much as possible which is gratifying. Culture I did last week showed a few  Enterococcus faecalis. I am going to put him on Augmentin. I talked about ordering an x-ray in my note last week but that does not seem to have happened. We will review reorder the x-ray this week. 1/26; x-ray reordered last week was negative for osteomyelitis. We are using silver alginate on the wound on the third and fifth met heads on the left. He is using a Darco forefoot off loader 2/2; the area on the fifth met head is closed. Third met head is still open with tunneling depth and thick callus. 2/9; the area on the fifth met head remains closed however the third met head again has a small open area on presentation with tunneling in depth and surrounded by thick callus. This looks like a pressure issue. We have been using silver alginate 2/16; the area of the fifth metatarsal head remains closed however the area over the third metatarsal head again is a small open area but with some depth. I do not think this is changed much since last week. He is using Hydrofera Blue with forefoot off loader. He is not able to use a total contact cast on the left leg because he needs his left leg at work [employer]]. Fortunately the wound does not look infected. I changed him to endoform today Electronic Signature(s) Signed: 02/15/2020 5:18:57 PM By: Linton Ham [name] Entered By: Linton Ham on 02/15/2020 11:05:56 -------------------------------------------------------------------------------- Physical Exam Details Patient Name: Date of Service: [name], [name] 02/15/2020 [place] Patient Account Number: [id number] Date of Birth/Sex: Treating RN: 09/21/1971 (48 y.o. [name] Primary Care Provider: Charlott Rakes Other Clinician: Referring Provider: Treating Provider/Extender:Ory Elting, Sammuel Cooper, Enobong Weeks in Treatment: 5 Constitutional Sitting  or standing Blood Pressure is within target range for patient.. Pulse regular and within target range for patient.[name] Respirations regular, non-labored and within target range.. Temperature is normal and within the target range for the patient.[name] Appears in no distress. Notes Wound exam The area over the fifth met head is closed. Third met head has roughly 0.5 cm in depth after debridement of surrounding skin and subcutaneous tissue. The tissue itself looks healthy. This does not probe to bone there is no purulence. No surrounding erythema Electronic Signature(s) Signed: 02/15/2020 5:18:57 PM By: Linton Ham [name] Entered By: Linton Ham on 02/15/2020 11:06:38 -------------------------------------------------------------------------------- Physician Orders Details Patient Name: Date of Service: [name], [name] 02/15/2020 9:15 AM Medical Record [id number] Patient Account Number: [id number] Date of Birth/Sex: Treating RN: [birth year] (48 y.o. [name]) Carlene Coria Primary Care Provider: Charlott Rakes Other Clinician: Referring Provider: Treating Provider/Extender:Bookert Guzzi, Sammuel Cooper, Douglass Rivers in Treatment: 5 Verbal / Phone Orders: No Diagnosis Coding ICD-10 Coding Code Description E11.621 Type 2 diabetes mellitus with foot ulcer L97.522 Non-pressure chronic ulcer of other part of left foot with fat layer exposed L97.521 Non-pressure chronic ulcer of other part of left foot limited to breakdown of skin E11.42 Type 2 diabetes mellitus with diabetic polyneuropathy Follow-up Appointments Return Appointment in 1 week. Dressing Change Frequency Change dressing every day. Wound Cleansing May shower and wash wound with soap and water. Primary Wound Dressing Wound #1 Left Metatarsal head third Endoform - felt to periwound Secondary Dressing Wound #1 Left Metatarsal head third Kerlix/Rolled Gauze - secure with tape Dry Gauze Off-Loading Other: - front off loading shoe with  felt to shoe Electronic Signature(s) Signed: 02/15/2020 5:18:57 PM By: Linton Ham [name] Signed: 02/16/2020 5:45:19 PM By: Carlene Coria RN Entered By: Carlene Coria on 02/15/2020 10:41:13 -------------------------------------------------------------------------------- Problem List Details Patient  Name: Date of Service: [name], [name] 02/15/2020 [place] Patient Account Number: [id number] Date of Birth/Sex: Treating RN: March 13, 1971 (48 y.o. [name]) Carlene Coria Primary Care Provider: Charlott Rakes Other Clinician: Referring Provider: Treating Provider/Extender:Lota Leamer, Sammuel Cooper, Douglass Rivers in Treatment: 5 Active Problems ICD-10 Evaluated Encounter Code Description Active Date Today Diagnosis E11.621 Type 2 diabetes mellitus with foot ulcer 01/11/2020 No Yes L97.522 Non-pressure chronic ulcer of other part of left foot 01/11/2020 No Yes with fat layer exposed L97.521 Non-pressure chronic ulcer of other part of left foot 01/11/2020 No Yes limited to breakdown of skin E11.42 Type 2 diabetes mellitus with diabetic polyneuropathy 01/11/2020 No Yes Inactive Problems Resolved Problems Electronic Signature(s) Signed: 02/15/2020 5:18:57 PM By: Linton Ham [name] Entered By: Linton Ham on 02/15/2020 11:04:09 -------------------------------------------------------------------------------- Progress Note Details Patient Name: Date of Service: [name], [name] 02/15/2020 9:15 AM Medical Record [id number] Patient Account Number: [id number] Date of Birth/Sex: Treating RN: [birth year] (48 y.o. [name] Primary Care Provider: Charlott Rakes Other Clinician: Referring Provider: Treating Provider/Extender:Ellysa Parrack, Sammuel Cooper, Charlane Ferretti Weeks in Treatment: 5 Subjective History of Present Illness (HPI) ADMISSION 01/11/2020 This is a 49 year old man with uncontrolled type 2 diabetes with a recent hemoglobin A1c earlier this year of 13.4. He is  on insulin and glipizide. He does not take his blood sugars at home. He does have a follow-up with primary care later this month I believe on January 27. He tells [place] that roughly a month ago he was walking with a shoe with a hole in his foot. He took the shoe off and there was an open wound at roughly the left fourth met head. This has significant undermining and raised edges. He has not noticed any purulence he does not feel unwell. More recently he was taking skin off the bottom of his foot and has a superficial area on the left fifth met head. He has not been offloading this. The patient was in the ER on 12/20. They gave him Bactroban which she has been using on the wound and 10 days worth of doxycycline. No x-rays were done. He has not had vascular studies. He is also been using hydrogen peroxide. Past medical history type 2 diabetes uncontrolled, chronic systolic heart failure, coronary artery disease with a history of congestive heart failure with stents. Hypertension hyperlipidemia and chronic renal insufficiency ABI in this clinic was 1.14 on the left. 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 He is uncertain whether he would be able to work if we put him on some form of restriction 1/19; he is generally doing quite well. Using silver alginate on the wounds. Things actually look better. He has a forefoot offloading boot which she seems to be compliant about. He has support at work to stay off his foot is much as possible which is gratifying. Culture I did last week showed a few Enterococcus faecalis. I am going to put him on Augmentin. I talked about ordering an x-ray in my note last week but that does not seem to have happened. We will review reorder the x-ray this week. 1/26; x-ray reordered last week was negative for osteomyelitis. We are using silver alginate on the wound on the third and fifth met heads on the left. He is using a Darco forefoot off  loader 2/2; the area on the fifth met head is closed. Third met head is still open with tunneling depth and thick callus. 2/9; the area on the fifth met  head remains closed however the third met head again has a small open area on presentation with tunneling in depth and surrounded by thick callus. This looks like a pressure issue. We have been using silver alginate 2/16; the area of the fifth metatarsal head remains closed however the area over the third metatarsal head again is a small open area but with some depth. I do not think this is changed much since last week. He is using Hydrofera Blue with forefoot off loader. He is not able to use a total contact cast on the left leg because he needs his left leg at work [employer]]. Fortunately the wound does not look infected. I changed him to endoform today Objective Constitutional Sitting or standing Blood Pressure is within target range for patient.. Pulse regular and within target range for patient.[name] Respirations regular, non-labored and within target range.. Temperature is normal and within the target range for the patient.[name] Appears in no distress. Vitals Time Taken: 9:54 AM, Height: 78 in, Source: Stated, Weight: 285 lbs, Source: Stated, BMI: 32.9, Temperature: 97.9 F, Pulse: 80 bpm, Respiratory Rate: 18 breaths/min, Blood Pressure: 103/76 mmHg. General Notes: Wound exam ooThe area over the fifth met head is closed. Third met head has roughly 0.5 cm in depth after debridement of surrounding skin and subcutaneous tissue. The tissue itself looks healthy. This does not probe to bone there is no purulence. No surrounding erythema Integumentary (Hair, Skin) Wound #1 status is Open. Original cause of wound was Trauma. The wound is located on the Left Metatarsal head third. The wound measures 0.3cm length x 0.2cm width x 0.3cm depth; 0.047cm^2 area and 0.014cm^3 volume. There is Fat Layer (Subcutaneous Tissue) Exposed exposed. There is no  tunneling or undermining noted. There is a small amount of serosanguineous drainage noted. The wound margin is thickened. There is large (67-100%) red granulation within the wound bed. There is a small (1-33%) amount of necrotic tissue within the wound bed including Adherent Slough. 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 There was a Excisional Skin/Subcutaneous Tissue Debridement with a total area of 0.06 sq cm performed by Ricard Dillon., [name]. With the following instrument(s): Curette to remove Viable and Non-Viable tissue/material. Material removed includes Subcutaneous Tissue, Slough, Skin: Dermis, and Skin: Epidermis after achieving pain control using Other (benzocaine 20%). No specimens were taken. A time out was conducted at 10:38, prior to the start of the procedure. A Moderate amount of bleeding was controlled with Pressure. The procedure was tolerated well with a pain level of 2 throughout and a pain level of 0 following the procedure. Post Debridement Measurements: 0.3cm length x 0.2cm width x 0.3cm depth; 0.014cm^3 volume. Character of Wound/Ulcer Post Debridement is improved. Severity of Tissue Post Debridement is: Fat layer exposed. Post procedure Diagnosis Wound #1: Same as Pre-Procedure Plan Follow-up Appointments: Return Appointment in 1 week. Dressing Change Frequency: Change dressing every day. Wound Cleansing: May shower and wash wound with soap and water. Primary Wound Dressing: Wound #1 Left Metatarsal head  third: Endoform - felt to periwound Secondary Dressing: Wound #1 Left Metatarsal  head third: Kerlix/Rolled Gauze - secure with tape Dry Gauze Off-Loading: Other: - front off loading shoe with felt to shoe 1. Left third metatarsal head. I change the primary dressing to endoform change daily 2. We have no choice but to continue in the Darco forefoot off loader. He cannot wear a cast at work. 3. No evidence of infection Electronic Signature(s) Signed: 02/15/2020 5:18:57 PM By: Linton Ham [name] Entered By: Linton Ham on 02/15/2020 11:07:33 -------------------------------------------------------------------------------- SuperBill Details Patient Name: Date of Service: [name], [name] 02/15/2020 Medical Record [id number] Patient Account Number: [id number] Date of Birth/Sex: Treating RN: July 09, 1971 (48 y.o. [name]) Dolores Lory, Morey Hummingbird Primary Care Provider: Charlott Rakes Other Clinician: Referring Provider: Treating Provider/Extender:Kaelon Weekes, Sammuel Cooper, Enobong Weeks in Treatment: 5 Diagnosis Coding ICD-10 Codes Code Description E11.621 Type 2 diabetes mellitus with foot ulcer L97.522 Non-pressure chronic ulcer of other part of left foot with fat layer exposed L97.521 Non-pressure chronic ulcer of other part of left foot limited to breakdown of skin E11.42 Type 2 diabetes mellitus with diabetic polyneuropathy Facility Procedures CPT4 Code Description: [id number] - DEB SUBQ TISSUE 20 SQ CM/< ICD-10 Diagnosis Description L97.522 Non-pressure chronic ulcer of other part of left foot with Modifier: fat layer ex Quantity: 1 posed Physician Procedures CPT4 Code Description: [id number] 98022 - WC PHYS SUBQ TISS 20 SQ CM ICD-10 Diagnosis Description L97.522 Non-pressure chronic ulcer of other part of left foot with Modifier: fat layer exp Quantity: 1 osed Electronic Signature(s) Signed: 02/15/2020 5:18:57 PM By: Linton Ham [name] Entered By: Linton Ham on 02/15/2020  11:07:49

## 2020-02-16 NOTE — Progress Notes (Signed)
TYRAN, HUSER ([id number]) Visit Report for 02/15/2020 Arrival Information Details Patient Name: Date of Service: [name], [name] 02/15/2020 9:15 AM Medical Record [id number] Patient Account Number: [id number] Date of Birth/Sex: Treating RN: 02/01/1971 (49 y.o. Damaris Schooner Primary Care Kongmeng Santoro: Hoy Register Other Clinician: Referring Aristea Posada: Treating Anberlyn Feimster/Extender:Robson, Gweneth Fritter, Sherlie Ban in Treatment: 5 Visit Information History Since Last Visit Added or deleted any medications: No Patient Arrived: Ambulatory Any new allergies or adverse reactions: No Arrival Time: 09:49 Had a fall or experienced change in No Accompanied By: self activities of daily living that may affect Transfer Assistance: None risk of falls: Patient Identification Verified: Yes Signs or symptoms of abuse/neglect since No Secondary Verification Process Yes last visito Completed: Hospitalized since last visit: No Patient Requires Transmission-Based No Implantable device outside of the clinic No Precautions: excluding Patient Has Alerts: No cellular tissue based products placed in the center since last visit: Has Dressing in Place as Prescribed: Yes Has Footwear/Offloading in Place as Yes Prescribed: Left: Wedge Shoe Pain Present Now: No Electronic Signature(s) Signed: 02/15/2020 11:16:04 AM By: Zenaida Deed RN, BSN Entered By: Zenaida Deed on 02/15/2020 09:54:03 -------------------------------------------------------------------------------- Encounter Discharge Information Details Patient Name: Date of Service: [name], [name] 02/15/2020 9:15 AM Medical Record [id number] Patient Account Number: [id number] Date of Birth/Sex: Treating RN: 07-Sep-1971 (48 y.o. Katherina Right Primary Care Letishia Elliott: Hoy Register Other Clinician: Referring Anhthu Perdew: Treating Michial Disney/Extender:Robson, Gweneth Fritter, Sherlie Ban in Treatment: 5 Encounter  Discharge Information Items Post Procedure Vitals Discharge Condition: Stable Temperature (F): 97.9 Ambulatory Status: Ambulatory Pulse (bpm): 80 Discharge Destination: Home Respiratory Rate (breaths/min): 18 Transportation: Private Auto Blood Pressure (mmHg): 103/76 Accompanied By: self Schedule Follow-up Appointment: Yes Clinical Summary of Care: Patient Declined Electronic Signature(s) Signed: 02/15/2020 5:22:24 PM By: Cherylin Mylar Entered By: Cherylin Mylar on 02/15/2020 10:54:54 -------------------------------------------------------------------------------- Lower Extremity Assessment Details Patient Name: Date of Service: [name], [name] 02/15/2020 9:15 AM Medical Record [id number] Patient Account Number: [id number] Date of Birth/Sex: Treating RN: 07-12-1971 (49 y.o. Damaris Schooner Primary Care Mayling Aber: Hoy Register Other Clinician: Referring Alyene Predmore: Treating Hayato Guaman/Extender:Robson, Gweneth Fritter, Odette Horns Weeks in Treatment: 5 Edema Assessment Assessed: [Left: No] [Right: No] Edema: [Left: N] [Right: o] Calf Left: Right: Point of Measurement: 36 cm From Medial Instep 39 cm cm Ankle Left: Right: Point of Measurement: 10 cm From Medial Instep 22 cm cm Vascular Assessment Pulses: Dorsalis Pedis Palpable: [Left:Yes] Electronic Signature(s) Signed: 02/15/2020 11:16:04 AM By: Zenaida Deed RN, BSN Entered By: Zenaida Deed on 02/15/2020 10:00:46 -------------------------------------------------------------------------------- Multi Wound Chart Details Patient Name: Date of Service: [name], [name] 02/15/2020 9:15 AM Medical Record [id number] Patient Account Number: [id number] Date of Birth/Sex: Treating RN: 1971-01-16 (48 y.o. [name]) [name] Primary Care Demetri Kerman: Hoy Register Other Clinician: Referring Meganne Rita: Treating Namiah Dunnavant/Extender:Robson, Gweneth Fritter, Enobong Weeks in Treatment: 5 Vital Signs Height(in):  78 Pulse(bpm): 80 Weight(lbs): 285 Blood Pressure(mmHg): 103/76 Body Mass Index(BMI): 33 Temperature(F): 97.9 Respiratory 18 Rate(breaths/min): Photos: [1:No Photos] [N/A:N/A] Wound Location: [1:Left Metatarsal head third N/A] Wounding Event: [1:Trauma] [N/A:N/A] Primary Etiology: [1:Diabetic Wound/Ulcer of the N/A Lower Extremity] Comorbid History: [1:Congestive Heart Failure, N/A Coronary Artery Disease, Hypertension, Myocardial Infarction, Type II Diabetes, Neuropathy] Date Acquired: [1:12/11/2019] [N/A:N/A] Weeks of Treatment: [1:5] [N/A:N/A] Wound Status: [1:Open] [N/A:N/A] Measurements L x W x D 0.3x0.2x0.3 [N/A:N/A] (cm) Area (cm) : [1:0.047] [N/A:N/A] Volume (cm) : [1:0.014] [N/A:N/A] % Reduction in Area: [1:94.00%] [N/A:N/A] % Reduction in Volume: 96.40% [N/A:N/A] Classification: [1:Grade 2] [N/A:N/A] Exudate Amount: [1:Small] [N/A:N/A] Exudate Type: [1:Serosanguineous] [N/A:N/A] Exudate Color: [1:red, brown] [N/A:N/A] Wound Margin: [  1:Thickened] [N/A:N/A] Granulation Amount: [1:Large (67-100%)] [N/A:N/A] Granulation Quality: [1:Red] [N/A:N/A] Necrotic Amount: [1:Small (1-33%)] [N/A:N/A] Exposed Structures: [1:Fat Layer (Subcutaneous N/A Tissue) Exposed: Yes Fascia: No Tendon: No Muscle: No Joint: No Bone: No] Epithelialization: [1:Small (1-33%)] [N/A:N/A] Debridement: [1:Debridement - Excisional N/A] Pre-procedure [1:10:38] [N/A:N/A] Verification/Time Out Taken: Pain Control: [1:Other] [N/A:N/A] Tissue Debrided: [1:Subcutaneous, Slough] [N/A:N/A] Level: [1:Skin/Subcutaneous Tissue] [N/A:N/A] Debridement Area (sq cm):0.06 [N/A:N/A] Instrument: [1:Curette] [N/A:N/A] Bleeding: [1:Moderate] [N/A:N/A] Hemostasis Achieved: [1:Pressure] [N/A:N/A] Procedural Pain: [1:2] [N/A:N/A] Post Procedural Pain: [1:0] [N/A:N/A] Debridement Treatment Procedure was tolerated [N/A:N/A] Response: [1:well] Post Debridement [1:0.3x0.2x0.3] [N/A:N/A] Measurements L x W x  D (cm) Post Debridement [1:0.014] [N/A:N/A] Volume: (cm) Procedures Performed: Debridement [N/A:N/A] Treatment Notes Wound #1 (Left Metatarsal head third) 1. Cleanse With Wound Cleanser 2. Periwound Care Skin Prep 3. Primary Dressing Applied Endoform 4. Secondary Dressing Dry Gauze Roll Gauze 5. Secured With Tape 7. Footwear/Offloading device applied Felt/Foam Other footwear/offloading device (specify in notes) Notes front offloader, felt in shoe and around wound Electronic Signature(s) Signed: 02/15/2020 5:18:57 PM By: Baltazar Najjar MD Signed: 02/16/2020 5:45:19 PM By: [name] RN Entered By: Baltazar Najjar on 02/15/2020 11:04:15 -------------------------------------------------------------------------------- Multi-Disciplinary Care Plan Details Patient Name: Date of Service: [name], [name] 02/15/2020 9:15 AM Medical Record [id number] Patient Account Number: [id number] Date of Birth/Sex: Treating RN: Feb 02, 1971 (48 y.o. [name] Primary Care Toran Murch: Hoy Register Other Clinician: Referring Dat Derksen: Treating Din Bookwalter/Extender:Robson, Gweneth Fritter, Enobong Weeks in Treatment: 5 Active Inactive Wound/Skin Impairment Nursing Diagnoses: Knowledge deficit related to ulceration/compromised skin integrity Goals: Patient/caregiver will verbalize understanding of skin care regimen Date Initiated: 01/11/2020 Target Resolution Date: 03/03/2020 Goal Status: Active Ulcer/skin breakdown will have a volume reduction of 30% by week 4 Date Initiated: 01/11/2020 Date Inactivated: 02/08/2020 Target Resolution Date: 02/04/2020 Goal Status: Unmet Unmet Reason: comorbities Ulcer/skin breakdown will have a volume reduction of 50% by week 8 Date Initiated: 02/08/2020 Target Resolution Date: 03/03/2020 Goal Status: Active Interventions: Assess patient/caregiver ability to obtain necessary supplies Assess patient/caregiver ability to perform ulcer/skin care regimen  upon admission and as needed Assess ulceration(s) every visit Notes: Electronic Signature(s) Signed: 02/16/2020 5:45:19 PM By: [name] RN Entered By: [name] on 02/15/2020 09:16:10 -------------------------------------------------------------------------------- Pain Assessment Details Patient Name: Date of Service: [name], [name] 02/15/2020 9:15 AM Medical Record [id number] Patient Account Number: [id number] Date of Birth/Sex: Treating RN: Sep 24, 1971 (49 y.o. Damaris Schooner Primary Care Sadaf Przybysz: Hoy Register Other Clinician: Referring Parminder Cupples: Treating Trystan Eads/Extender:Robson, Gweneth Fritter, Sherlie Ban in Treatment: 5 Active Problems Location of Pain Severity and Description of Pain Patient Has Paino No Site Locations Rate the pain. Current Pain Level: 0 Pain Management and Medication Current Pain Management: Electronic Signature(s) Signed: 02/15/2020 11:16:04 AM By: Zenaida Deed RN, BSN Entered By: Zenaida Deed on 02/15/2020 09:54:50 -------------------------------------------------------------------------------- Patient/Caregiver Education Details Patient Name: Date of Service: [name], [name] 2/16/2021andnbsp9:15 AM Medical Record [id number] Patient Account Number: [id number] Date of Birth/Gender: Dec 29, 1971 (48 y.o. M) Treating RN: [name] Primary Care Physician: Hoy Register Other Clinician: Referring Physician: Treating Physician/Extender:Robson, Gweneth Fritter, Sherlie Ban in Treatment: 5 Education Assessment Education Provided To: Patient Education Topics Provided Wound/Skin Impairment: Methods: Explain/Verbal Responses: State content correctly Electronic Signature(s) Signed: 02/16/2020 5:45:19 PM By: [name] RN Entered By: [name] on 02/15/2020 09:16:32 -------------------------------------------------------------------------------- Wound Assessment Details Patient Name: Date of  Service: [name], [name] 02/15/2020 9:15 AM Medical Record [id number] Patient Account Number: [id number] Date of Birth/Sex: Treating RN: Jun 09, 1971 (48 y.o. [name] Primary Care Zailee Vallely: Hoy Register Other Clinician: Referring Almadelia Looman: Treating Jareth Pardee/Extender:Robson, Gweneth Fritter,  Enobong Weeks in Treatment: 5 Wound Status Wound Number: 1 Primary Diabetic Wound/Ulcer of the Lower Extremity Etiology: Wound Location: Left Metatarsal head third Wound Open Wounding Event: Trauma Status: Date Acquired: 12/11/2019 Comorbid Congestive Heart Failure, Coronary Artery Weeks Of Treatment: 5 History: Disease, Hypertension, Myocardial Infarction, Clustered Wound: No Type II Diabetes, Neuropathy Photos Wound Measurements Length: (cm) 0.3 % Reduction in A Width: (cm) 0.2 % Reduction in V Depth: (cm) 0.3 Epithelializatio Area: (cm) 0.047 Tunneling: Volume: (cm) 0.014 Undermining: Wound Description Classification: Grade 2 Foul Odor After Wound Margin: Thickened Slough/Fibrino Exudate Amount: Small Exudate Type: Serosanguineous Exudate Color: red, brown Wound Bed Granulation Amount: Large (67-100%) Granulation Quality: Red Fascia Exposed: Necrotic Amount: Small (1-33%) Fat Layer (Subcu Necrotic Quality: Adherent Slough Tendon Exposed: Muscle Exposed: Joint Exposed: Bone Exposed: Cleansing: No Yes Exposed Structure No taneous Tissue) Exposed: Yes No No No No rea: 94% olume: 96.4% n: Small (1-33%) No No Treatment Notes Wound #1 (Left Metatarsal head third) 1. Cleanse With Wound Cleanser 2. Periwound Care Skin Prep 3. Primary Dressing Applied Endoform 4. Secondary Dressing Dry Gauze Roll Gauze 5. Secured With Tape 7. Footwear/Offloading device applied Felt/Foam Other footwear/offloading device (specify in notes) Notes front offloader, felt in shoe and around wound Electronic Signature(s) Signed: 02/15/2020 4:25:10 PM By: Mikeal Hawthorne EMT/HBOT Signed: 02/16/2020 5:45:19 PM By: Carlene Coria RN Previous Signature: 02/15/2020 11:16:04 AM Version By: Baruch Gouty RN, BSN Entered By: Mikeal Hawthorne on 02/15/2020 15:44:00 -------------------------------------------------------------------------------- Vitals Details Patient Name: Date of Service: [name], [name] 02/15/2020 [place] Patient Account Number: [id number] Date of Birth/Sex: Treating RN: 10-21-1971 (50 y.o. [name] Primary Care Jamilette Suchocki: Charlott Rakes Other Clinician: Referring Luisana Lutzke: Treating Shavonta Gossen/Extender:Robson, Sammuel Cooper, Douglass Rivers in Treatment: 5 Vital Signs Time Taken: 09:54 Temperature (F): 97.9 Height (in): 78 Pulse (bpm): 80 Source: Stated Respiratory Rate (breaths/min): 18 Weight (lbs): 285 Blood Pressure (mmHg): 103/76 Source: Stated Reference Range: 80 - 120 mg / dl Body Mass Index (BMI): 32.9 Electronic Signature(s) Signed: 02/15/2020 11:16:04 AM By: Baruch Gouty RN, BSN Entered By: Baruch Gouty on 02/15/2020 09:54:41

## 2020-02-21 ENCOUNTER — Other Ambulatory Visit: Payer: Self-pay

## 2020-02-21 ENCOUNTER — Encounter: Payer: Self-pay | Admitting: Family Medicine

## 2020-02-21 ENCOUNTER — Ambulatory Visit: Payer: Self-pay | Attending: Family Medicine | Admitting: Family Medicine

## 2020-02-21 VITALS — BP 100/74 | HR 92 | Ht 78.0 in | Wt 278.0 lb

## 2020-02-21 DIAGNOSIS — H938X3 Other specified disorders of ear, bilateral: Secondary | ICD-10-CM

## 2020-02-21 DIAGNOSIS — H6123 Impacted cerumen, bilateral: Secondary | ICD-10-CM

## 2020-02-21 MED ORDER — CETIRIZINE HCL 10 MG PO TABS: 10.0000 mg | ORAL_TABLET | Freq: Every day | ORAL | 1 refills | Status: DC

## 2020-02-21 NOTE — Progress Notes (Signed)
Subjective:  Patient ID: [name], male    DOB: 1971-10-22  Age: 49 y.o. MRN: [id number]  CC: Tinnitus   HPI [name]  is a 49 year old male with a history of ischemic cardiomyopathy previously on milrinone (EF 40 to 45% from echo 08/2019 ), coronary artery disease (s/p DES x2), type 2 diabetes mellitus (A1c 9.4), hypertension, obesity here for follow-up visit.  Over the last 3 days he has noticed squinting or moving his head from side to side makes his ears pop. He has slight pain on the bridge of nose but no facial pressure. Denies hearing loss, dizziness, vertigo. Denies presence of Tinnitus.   Past Medical History:  Diagnosis Date  . CAD (coronary artery disease)    NSTEMI 10/2011: LHC 11/04/11: pLAD 90%, mLAD 60-70%, small D2 sub totally occluded at ostium, small OM1 90% ostial, 90% mid, mOM2 30%, oPL 80%, RCA 30%, dPDA 80%, EF 20% with ant AK.  PCI:  Promus DES to pLAD.  [name] Chronic systolic heart failure ([place])   . DM2 (diabetes mellitus, type 2) ([place])   . HTN (hypertension)   . Hyperlipidemia   . Ischemic cardiomyopathy    Echo 11/03/11: mod LVH, mild focal basal septal hypertrophy, EF 15%, grade 2 diast dysfxn, mild MR, mild to mod LAE, mild RVE, mild to mod reduced RVSF.  EF 3/5 50% by echo  . Obesity     Past Surgical History:  Procedure Laterality Date  . CARDIAC CATHETERIZATION N/A 10/25/2016   Procedure: Right/Left Heart Cath and Coronary Angiography;  Surgeon: Leonie Man, MD;  Location: [place] [facility];  Service: Cardiovascular;  Laterality: N/A;  . CARDIAC CATHETERIZATION N/A 10/25/2016   Procedure: Coronary Stent Intervention;  Surgeon: Leonie Man, MD;  Location: [place] [facility];  Service: Cardiovascular;  Laterality: N/A;  . CARDIAC CATHETERIZATION N/A 10/28/2016   Procedure: Coronary Stent Intervention;  Surgeon: Burnell Blanks, MD;  Location: [place] [facility];  Service: Cardiovascular;  Laterality: N/A;  . LEFT AND RIGHT  HEART CATHETERIZATION WITH CORONARY ANGIOGRAM N/A 11/04/2011   Procedure: LEFT AND RIGHT HEART CATHETERIZATION WITH CORONARY ANGIOGRAM;  Surgeon: Minus Breeding, MD;  Location[facility];  Service: Cardiovascular;  Laterality: N/A;  . NONE    . PERCUTANEOUS CORONARY STENT INTERVENTION (PCI-S)  11/04/2011   Procedure: PERCUTANEOUS CORONARY STENT INTERVENTION (PCI-S);  Surgeon: Sherren Mocha, MD;  Location[facility] C[facility];  Service: Cardiovascular;;    Family History  Problem Relation Age of Onset  . Heart disease Mother        MOTHER HAD CABG    No Known Allergies  Outpatient Medications Prior to Visit  Medication Sig Dispense Refill  . aspirin 81 MG chewable tablet Chew 1 tablet (81 mg total) by mouth daily. 30 tablet 6  . atorvastatin (LIPITOR) 80 MG tablet Take 1 tablet (80 mg total) by mouth daily. 30 tablet 3  . Blood Glucose Monitoring Suppl (TRUE METRIX METER) DEVI 1 each by Does not apply route 3 (three) times daily. 1 Device 0  . carvedilol (COREG) 6.25 MG tablet Take 1 tablet (6.25 mg total) by mouth 2 (two) times daily with a meal. 60 tablet 3  . dapagliflozin propanediol (FARXIGA) 10 MG TABS tablet Take 10 mg by mouth daily before breakfast. 30 tablet 6  . digoxin (LANOXIN) 0.125 MG tablet Take 1 tablet (125 mcg total) by mouth daily. 30 tablet 3  . doxycycline (VIBRAMYCIN) 100 MG capsule Take 1 capsule (100 mg total)  by mouth 2 (two) times daily. 20 capsule 0  . furosemide (LASIX) 40 MG tablet Take 1 tablet (40 mg total) by mouth daily. 30 tablet 3  . glipiZIDE (GLUCOTROL) 10 MG tablet TAKE 1 TABLET BY MOUTH TWICE DAILY BEFORE MEAL(S). 60 tablet 6  . glucose blood (TRUE METRIX BLOOD GLUCOSE TEST) test strip 1 each by Other route 3 (three) times daily. 100 each 12  . Insulin Glargine (LANTUS SOLOSTAR) 100 UNIT/ML Solostar Pen Inject 20 Units into the skin daily. 5 pen 6  . Insulin Pen Needle (TRUEPLUS PEN NEEDLES) 31G X 5 MM MISC USE AS DIRECTED AT BEDTIME. 100 each 6  .  omeprazole (PRILOSEC) 20 MG capsule Take 1 capsule (20 mg total) by mouth daily. 30 capsule 3  . sacubitril-valsartan (ENTRESTO) 97-103 MG Take 1 tablet by mouth 2 (two) times daily. 180 tablet 3  . sildenafil (REVATIO) 20 MG tablet Take 1 tablet (20 mg total) by mouth as needed (for erectile dysfunction). 10 tablet 3  . spironolactone (ALDACTONE) 50 MG tablet Take 1 tablet (50 mg total) by mouth daily. 30 tablet 3  . TRUEplus Lancets 28G MISC 1 each by Does not apply route 3 (three) times daily. 100 each 11  . mupirocin ointment (BACTROBAN) 2 % Apply 1 application topically 2 (two) times daily. (Patient not taking: Reported on 01/25/2020) 22 g 0   No facility-administered medications prior to visit.     ROS Review of Systems  Constitutional: Negative for activity change and appetite change.  HENT: Negative for sinus pressure and sore throat.   Eyes: Negative for visual disturbance.  Respiratory: Negative for cough, chest tightness and shortness of breath.   Cardiovascular: Negative for chest pain and leg swelling.  Gastrointestinal: Negative for abdominal distention, abdominal pain, constipation and diarrhea.  Endocrine: Negative.   Genitourinary: Negative for dysuria.  Musculoskeletal: Negative for joint swelling and myalgias.  Skin: Negative for rash.  Allergic/Immunologic: Negative.   Neurological: Negative for weakness, light-headedness and numbness.  Psychiatric/Behavioral: Negative for dysphoric mood and suicidal ideas.    Objective:  BP 100/74   Pulse 92   Ht 6\' 6"  (1.981 m)   Wt 278 lb (126.1 kg)   SpO2 97%   BMI 32.13 kg/m   BP/Weight 02/21/2020 01/25/2020 12/19/2019  Systolic BP 100 108 155  Diastolic BP 74 70 93  Wt. (Lbs) 278 278 -  BMI 32.13 32.13 -      Physical Exam Constitutional:      Appearance: He is well-developed.  HENT:     Head:     Comments: Cerumen obscuring bilateral tympanic membrane Neck:     Vascular: No JVD.  Cardiovascular:     Rate  and Rhythm: Normal rate.     Heart sounds: Normal heart sounds. No murmur.  Pulmonary:     Effort: Pulmonary effort is normal.     Breath sounds: Normal breath sounds. No wheezing or rales.  Chest:     Chest wall: No tenderness.  Abdominal:     General: Bowel sounds are normal. There is no distension.     Palpations: Abdomen is soft. There is no mass.     Tenderness: There is no abdominal tenderness.  Musculoskeletal:        General: Normal range of motion.     Right lower leg: No edema.     Left lower leg: No edema.  Neurological:     Mental Status: He is alert and oriented to person, place, and time.  Psychiatric:        Mood and Affect: Mood normal.     CMP Latest Ref Rng & Units 01/25/2020 08/31/2019 07/06/2018  Glucose 65 - 99 mg/dL 347(Q) 259(D) 638(V)  BUN 6 - 24 mg/dL 24 56(E) 33(I)  Creatinine 0.76 - 1.27 mg/dL 9.51(O) 8.41 6.60(Y)  Sodium 134 - 144 mmol/L 144 136 139  Potassium 3.5 - 5.2 mmol/L 4.0 4.5 4.1  Chloride 96 - 106 mmol/L 105 103 104  CO2 20 - 29 mmol/L 24 22 27   Calcium 8.7 - 10.2 mg/dL 9.9 9.4 9.5  Total Protein 6.0 - 8.5 g/dL - - -  Total Bilirubin 0.0 - 1.2 mg/dL - - -  Alkaline Phos 39 - 117 IU/L - - -  AST 0 - 40 IU/L - - -  ALT 0 - 44 IU/L - - -    Lipid Panel     Component Value Date/Time   CHOL 189 08/31/2019 1156   CHOL 160 03/20/2018 0829   TRIG 180 (H) 08/31/2019 1156   HDL 39 (L) 08/31/2019 1156   HDL 34 (L) 03/20/2018 0829   CHOLHDL 4.8 08/31/2019 1156   VLDL 36 08/31/2019 1156   LDLCALC 114 (H) 08/31/2019 1156   LDLCALC 94 03/20/2018 0829   LDLDIRECT 117.0 06/14/2013 0826    CBC    Component Value Date/Time   WBC 6.3 03/20/2018 0829   WBC 9.1 11/29/2016 1431   RBC 5.40 03/20/2018 0829   RBC 5.47 11/29/2016 1431   HGB 14.9 03/20/2018 0829   HCT 45.0 03/20/2018 0829   PLT 203 03/20/2018 0829   MCV 83 03/20/2018 0829   MCH 27.6 03/20/2018 0829   MCH 26.3 (L) 11/29/2016 1431   MCHC 33.1 03/20/2018 0829   MCHC 33.0 11/29/2016  1431   RDW 13.6 03/20/2018 0829   LYMPHSABS 2.0 03/20/2018 0829   MONOABS 546 11/29/2016 1431   EOSABS 0.2 03/20/2018 0829   BASOSABS 0.0 03/20/2018 0829    Lab Results  Component Value Date   HGBA1C 9.4 (A) 01/25/2020    Assessment & Plan:   1. Bilateral impacted cerumen Bilateral ear irrigation performed Patient reports resolution of symptoms post irrigation  2. Popping of both ears Placed on antihistamine prophylactically in the event that sinus pressure could be contributing - cetirizine (ZYRTEC) 10 MG tablet; Take 1 tablet (10 mg total) by mouth daily.  Dispense: 30 tablet; Refill: 1   Return for Chronic medical conditions, keep previously scheduled appointment.    01/27/2020, MD, FAAFP. [facility] [place], [place]   02/21/2020, 4:15 PM

## 2020-02-21 NOTE — Progress Notes (Signed)
States that ears are clicking and popping no pain.

## 2020-02-25 ENCOUNTER — Other Ambulatory Visit: Payer: Self-pay

## 2020-02-25 ENCOUNTER — Encounter (HOSPITAL_BASED_OUTPATIENT_CLINIC_OR_DEPARTMENT_OTHER): Payer: Self-pay | Admitting: Internal Medicine

## 2020-02-25 MED FILL — !LANTUS SOLOSTAR 100UNITS/M: 100 | 20 days supply | Qty: 3 | Fill #7

## 2020-02-25 NOTE — Progress Notes (Signed)
REDDING, CLOE ([id number]) Visit Report for 02/25/2020 HPI Details Patient Name: Date of Service: [name], [name] 02/25/2020 3:45 PM Medical Record [name] Patient Account Number: [id number] Date of Birth/Sex: Treating RN: [birth year] (49 y.o. [name] Primary Care Provider: Charlott [name] Other Clinician: Referring Provider: Treating Provider/Extender:[name], [name], [name] in Treatment: 6 History of Present Illness HPI Description: ADMISSION 01/11/2020 This is a 49 year old man with uncontrolled type 2 diabetes with a recent hemoglobin A1c earlier this year of 13.4. He is on insulin and glipizide. He does not take his blood sugars at home. He does have a follow-up with primary care later this month I believe on January 27. He tells [place] that roughly a month ago he was walking with a shoe with a hole in his foot. He took the shoe off and there was an open wound at roughly the left fourth met head. This has significant undermining and raised edges. He has not noticed any purulence he does not feel unwell. More recently he was taking skin off the bottom of his foot and has a superficial area on the left fifth met head. He has not been offloading this. The patient was in the ER on 12/20. They gave him Bactroban which she has been using on the wound and 10 days worth of doxycycline. No x-rays were done. He has not had vascular studies. He is also been using hydrogen peroxide. Past medical history type 2 diabetes uncontrolled, chronic systolic heart failure, coronary artery disease with a history of congestive heart failure with stents. Hypertension hyperlipidemia and chronic renal insufficiency ABI in this clinic was 1.14 on the left. 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 He is uncertain whether he would be able to work if we put him on some form of restriction 1/19; he is generally doing quite well. Using silver alginate  on the wounds. Things actually look better. He has a forefoot offloading boot which she seems to be compliant about. He has support at work to stay off his foot is much as possible which is gratifying. Culture I did last week showed a few Enterococcus faecalis. I am going to put him on Augmentin. I talked about ordering an x-ray in my note last week but that does not seem to have happened. We will review reorder the x-ray this week. 1/26; x-ray reordered last week was negative for osteomyelitis. We are using silver alginate on the wound on the third and fifth met heads on the left. He is using a Darco forefoot off loader 2/2; the area on the fifth met head is closed. Third met head is still open with tunneling depth and thick callus. 2/9; the area on the fifth met head remains closed however the third met head again has a small open area on presentation with tunneling in depth and surrounded by thick callus. This looks like a pressure issue. We have been using silver alginate 2/16; the area of the fifth metatarsal head remains closed however the area over the third metatarsal head again is a small open area but with some depth. I do not think this is changed much since last week. He is using Hydrofera Blue with forefoot off loader. He is not able to use a total contact cast on the left leg because he needs his left leg at work [employer]]. Fortunately the wound does not look infected. I changed him to endoform today 2/26; the area of the fifth metatarsal head  remains closed. The area of the third metatarsal head has an even smaller opening this time. I used endoform on this this looks improved. He is offloading this is much as he can and a forefoot off loader on the left. 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 [name] Primary Care Provider: Charlott [name] Other Clinician: Referring Provider: Treating Provider/Extender:[name], [name], [name] in Treatment: 6 Constitutional Sitting or standing Blood Pressure is within target range for patient.. Pulse regular and within target range for patient.[name] Respirations regular, non-labored and within target range.. Temperature is normal and within the target range for the patient.[name] Appears in no distress. Cardiovascular Pedal pulses are palpable. Integumentary (Hair, Skin) No erythema around the wound no evidence of infection. Notes Wound exam The area over the fifth metatarsal head remains closed. Third met head is a much more shallow wound than last time at which time it was 0.5 cm. The wound surface area is also better I cannot identify any undermining. I did not debride this Electronic Signature(s) Signed: 02/25/2020 5:26:28 PM By: Glenn Ham MD Entered By: [name] on 02/25/2020 16:38:25 -------------------------------------------------------------------------------- Physician Orders Details Patient Name: Date of Service: [name], [name] 02/25/2020 3:45 PM Medical Record [id number] Patient Account Number: [id number] Date of Birth/Sex: Treating RN: 10-07-1971 (49 y.o. [name] Primary Care Provider: Charlott [name] Other Clinician: Referring Provider: Treating Provider/Extender:[name], [name], [name] in Treatment: 6 Verbal / Phone Orders: No Diagnosis Coding ICD-10 Coding Code Description E11.621 Type 2 diabetes mellitus with foot ulcer L97.522 Non-pressure chronic ulcer  of other part of left foot with fat layer exposed L97.521 Non-pressure chronic ulcer of other part of left foot limited to breakdown of skin E11.42 Type 2 diabetes mellitus with diabetic polyneuropathy Follow-up Appointments Return Appointment in 1 week. Dressing Change Frequency Change dressing every day. Wound Cleansing May shower and wash wound with soap and water. Primary Wound Dressing Wound #1 Left Metatarsal head third Endoform - felt to periwound Secondary Dressing Wound #1 Left Metatarsal head third Kerlix/Rolled Gauze - secure with tape Dry Gauze Off-Loading Other: - front off loading shoe with felt to shoe Electronic Signature(s) Signed: 02/25/2020 5:17:01 PM By: Kela Millin Signed: 02/25/2020 5:26:28 PM By: Glenn Ham MD Entered By: Kela Millin on 02/25/2020 16:14:31 -------------------------------------------------------------------------------- Problem List Details Patient Name: Date of Service: [name], [name] 02/25/2020 3:45 PM Medical Record [id number] Patient Account Number: [id number] Date of Birth/Sex: Treating RN: Apr 14, 1971 (49 y.o. [name] Primary Care Provider: Charlott [name] Other Clinician: Referring Provider: Treating Provider/Extender:Fredia Chittenden, [name], [name] in Treatment: 6 Active Problems ICD-10 Evaluated Encounter Code Description Active Date Today Diagnosis E11.621 Type 2 diabetes mellitus with foot ulcer 01/11/2020 No Yes L97.522 Non-pressure chronic ulcer of other part of left foot 01/11/2020 No Yes with fat layer exposed L97.521 Non-pressure chronic ulcer of other part of left foot 01/11/2020 No Yes limited to breakdown of skin E11.42 Type 2 diabetes mellitus with diabetic polyneuropathy 01/11/2020 No Yes Inactive Problems Resolved Problems Electronic Signature(s) Signed: 02/25/2020 5:26:28 PM By: Glenn Ham MD Entered By: [name] on 02/25/2020  16:33:57 -------------------------------------------------------------------------------- Progress Note Details Patient Name: Date of Service: [name], [name] 02/25/2020 3:45 PM Medical Record [id number] Patient Account  Number: [id number] Date of Birth/Sex: Treating RN: 05/22/1971 (49 y.o. [name] Primary Care Provider: Charlott [name] Other Clinician: Referring Provider: Treating Provider/Extender:Ethen Bannan, [name], [name] in Treatment: 6 Subjective History of Present Illness (HPI) ADMISSION 01/11/2020 This is a 49 year old man with uncontrolled type 2 diabetes with a recent hemoglobin A1c earlier this year of 13.4. He is on insulin and glipizide. He does not take his blood sugars at home. He does have a follow-up with primary care later this month I believe on January 27. He tells [place] that roughly a month ago he was walking with a shoe with a hole in his foot. He took the shoe off and there was an open wound at roughly the left fourth met head. This has significant undermining and raised edges. He has not noticed any purulence he does not feel unwell. More recently he was taking skin off the bottom of his foot and has a superficial area on the left fifth met head. He has not been offloading this. The patient was in the ER on 12/20. They gave him Bactroban which she has been using on the wound and 10 days worth of doxycycline. No x-rays were done. He has not had vascular studies. He is also been using hydrogen peroxide. Past medical history type 2 diabetes uncontrolled, chronic systolic heart failure, coronary artery disease with a history of congestive heart failure with stents. Hypertension hyperlipidemia and chronic renal insufficiency ABI in this clinic was 1.14 on the left. 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 He is uncertain whether he would be able to work if we put him on some form of restriction 1/19; he is  generally doing quite well. Using silver alginate on the wounds. Things actually look better. He has a forefoot offloading boot which she seems to be compliant about. He has support at work to stay off his foot is much as possible which is gratifying. Culture I did last week showed a few Enterococcus faecalis. I am going to put him on Augmentin. I talked about ordering an x-ray in my note last week but that does not seem to have happened. We will review reorder the x-ray this week. 1/26; x-ray reordered last week was negative for osteomyelitis. We are using silver alginate on the wound on the third and fifth met heads on the left. He is using a Darco forefoot off loader 2/2; the area on the fifth met head is closed. Third met head is still open with tunneling depth and thick callus. 2/9; the area on the fifth met head remains closed however the third met head again has a small open area on presentation with tunneling in depth and surrounded by thick callus. This looks like a pressure issue. We have been using silver alginate 2/16; the area of the fifth metatarsal head remains closed however the area over the third metatarsal head again is a small open area but with some depth. I do not think this is changed much since last week. He is using Hydrofera Blue with forefoot off loader. He is not able to use a total contact cast on the left leg because he needs his left leg at work [employer]]. Fortunately the wound does not look infected. I changed him to endoform today 2/26; the area of the fifth metatarsal head remains closed. The area of the third metatarsal head has an even smaller opening this time. I used endoform on this this looks improved.  He is offloading this is much as he can and a forefoot off loader on the left. He cannot have a total contact cast because of work responsibilities Objective Constitutional Sitting or standing Blood Pressure is within target range for patient.. Pulse  regular and within target range for patient.[name] Respirations regular, non-labored and within target range.. Temperature is normal and within the target range for the patient.[name] Appears in no distress. Vitals Time Taken: 3:42 PM, Height: 78 in, Weight: 285 lbs, BMI: 32.9, Temperature: 98.0 F, Pulse: 83 bpm, Respiratory Rate: 18 breaths/min, Blood Pressure: 131/89 mmHg. Cardiovascular Pedal pulses are palpable. General Notes: Wound exam ooThe area over the fifth metatarsal head remains closed. Third met head is a much more shallow wound than last time at which time it was 0.5 cm. The wound surface area is also better I cannot identify any undermining. I did not debride this Integumentary (Hair, Skin) No erythema around the wound no evidence of infection. Wound #1 status is Open. Original cause of wound was Trauma. The wound is located on the Left Metatarsal head third. The wound measures 0.1cm length x 0.1cm width x 0.2cm depth; 0.008cm^2 area and 0.002cm^3 volume. There is Fat Layer (Subcutaneous Tissue) Exposed exposed. There is no tunneling or undermining noted. There is a small amount of serosanguineous drainage noted. The wound margin is thickened. There is large (67-100%) red granulation within the wound bed. There is a small (1-33%) amount of necrotic tissue within the wound bed including Adherent Slough. 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 Dressing Change Frequency: Change dressing every day. Wound Cleansing: May shower and wash wound with soap and water. Primary Wound Dressing: Wound #1 Left Metatarsal head third: Endoform - felt to periwound Secondary Dressing: Wound  #1 Left Metatarsal head third: Kerlix/Rolled Gauze - secure with tape Dry Gauze Off-Loading: Other: - front off loading shoe with felt to shoe 1. Left third metatarsal head I think the wound is improved there is less depth and no obvious undermining. The depth of this may be 0.2 which is an improvement. Some of the circumference of this is thick subcutaneous tissue. I did not debride this. HOWEVER this may need debridement next week Electronic Signature(s) Signed: 02/25/2020 5:26:28 PM By: Glenn Ham MD Entered By: [name] on 02/25/2020 16:39:15 -------------------------------------------------------------------------------- SuperBill Details Patient Name: Date of Service: [name], [name] 02/25/2020 Medical Record [id number] Patient Account Number: [id number] Date of Birth/Sex: Treating RN: 1971-09-27 (49 y.o. [name] Primary Care Provider: Charlott [name] Other Clinician: Referring Provider: Treating Provider/Extender:Marsel Gail, [name], [name] in Treatment: 6 Diagnosis Coding ICD-10 Codes Code Description E11.621 Type 2 diabetes mellitus with foot ulcer L97.522 Non-pressure chronic ulcer of other part of left foot with fat layer exposed L97.521 Non-pressure chronic ulcer of other part of left foot limited to breakdown of skin E11.42 Type 2 diabetes mellitus with diabetic polyneuropathy Facility Procedures CPT4 Code: [id number] Description: 99213 - WOUND CARE VISIT-LEV 3 EST PT Modifier: Quantity: 1 Physician Procedures CPT4 Code Description: [id number] 09470 - WC PHYS LEVEL 3 - EST PT ICD-10 Diagnosis Description L97.522 Non-pressure chronic ulcer of other part of left foot with E11.621 Type 2 diabetes mellitus with foot ulcer Modifier: fat layer ex Quantity: 1 posed Electronic Signature(s)  Signed: 02/25/2020 5:17:01 PM By: Kela Millin Signed: 02/25/2020 5:26:28 PM By: Glenn Ham MD Entered By: Kela Millin on 02/25/2020  16:42:37

## 2020-03-01 ENCOUNTER — Other Ambulatory Visit (HOSPITAL_COMMUNITY): Payer: Self-pay | Admitting: Cardiology

## 2020-03-01 MED FILL — DIGOXIN 0.125 MG TABLET: 125 | 30 days supply | Qty: 30 | Fill #0

## 2020-03-01 MED FILL — SPIRONOLACTONE 50 MG TABLET: 50 | 30 days supply | Qty: 30 | Fill #1

## 2020-03-01 MED FILL — OMEPRAZOLE DR 20 MG CAPSULE: 20 | 30 days supply | Qty: 30 | Fill #1

## 2020-03-01 MED FILL — FUROSEMIDE 40 MG TAB: 40 | 30 days supply | Qty: 30 | Fill #1

## 2020-03-03 ENCOUNTER — Other Ambulatory Visit: Payer: Self-pay

## 2020-03-03 ENCOUNTER — Encounter (HOSPITAL_BASED_OUTPATIENT_CLINIC_OR_DEPARTMENT_OTHER): Payer: Self-pay | Attending: Internal Medicine | Admitting: Internal Medicine

## 2020-03-03 DIAGNOSIS — Z8631 Personal history of diabetic foot ulcer: Secondary | ICD-10-CM | POA: Insufficient documentation

## 2020-03-03 DIAGNOSIS — I2510 Atherosclerotic heart disease of native coronary artery without angina pectoris: Secondary | ICD-10-CM | POA: Insufficient documentation

## 2020-03-03 DIAGNOSIS — Z794 Long term (current) use of insulin: Secondary | ICD-10-CM | POA: Insufficient documentation

## 2020-03-03 DIAGNOSIS — I130 Hypertensive heart and chronic kidney disease with heart failure and stage 1 through stage 4 chronic kidney disease, or unspecified chronic kidney disease: Secondary | ICD-10-CM | POA: Insufficient documentation

## 2020-03-03 DIAGNOSIS — Z09 Encounter for follow-up examination after completed treatment for conditions other than malignant neoplasm: Secondary | ICD-10-CM | POA: Insufficient documentation

## 2020-03-03 DIAGNOSIS — N189 Chronic kidney disease, unspecified: Secondary | ICD-10-CM | POA: Insufficient documentation

## 2020-03-03 DIAGNOSIS — E1122 Type 2 diabetes mellitus with diabetic chronic kidney disease: Secondary | ICD-10-CM | POA: Insufficient documentation

## 2020-03-03 DIAGNOSIS — I5022 Chronic systolic (congestive) heart failure: Secondary | ICD-10-CM | POA: Insufficient documentation

## 2020-03-03 DIAGNOSIS — E1142 Type 2 diabetes mellitus with diabetic polyneuropathy: Secondary | ICD-10-CM | POA: Insufficient documentation

## 2020-03-03 NOTE — Progress Notes (Signed)
Glenn, [name] ([id number]) Visit Report for 03/03/2020 HPI Details Patient Name: Date of Service: Glenn, [name] 03/03/2020 3:15 PM Medical Record [id number] Patient Account Number: [id number] Date of Birth/Sex: Treating RN: 03/15/1971 (49 y.o. [name] Primary Care Provider: Charlott [name] Other Clinician: Referring Provider: Treating Provider/Extender:[name], [name], [name] in Treatment: 7 History of Present Illness HPI Description: ADMISSION 01/11/2020 This is a 49 year old man with uncontrolled type 2 diabetes with a recent hemoglobin A1c earlier this year of 13.4. He is on insulin and glipizide. He does not take his blood sugars at home. He does have a follow-up with primary care later this month I believe on January 27. He tells [place] that roughly a month ago he was walking with a shoe with a hole in his foot. He took the shoe off and there was an open wound at roughly the left fourth met head. This has significant undermining and raised edges. He has not noticed any purulence he does not feel unwell. More recently he was taking skin off the bottom of his foot and has a superficial area on the left fifth met head. He has not been offloading this. The patient was in the ER on 12/20. They gave him Bactroban which she has been using on the wound and 10 days worth of doxycycline. No x-rays were done. He has not had vascular studies. He is also been using hydrogen peroxide. Past medical history type 2 diabetes uncontrolled, chronic systolic heart failure, coronary artery disease with a history of congestive heart failure with stents. Hypertension hyperlipidemia and chronic renal insufficiency ABI in this clinic was 1.14 on the left. 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 He is uncertain whether he would be able to work if we put him on some form of restriction 1/19; he is generally doing quite well. Using silver alginate on  the wounds. Things actually look better. He has a forefoot offloading boot which she seems to be compliant about. He has support at work to stay off his foot is much as possible which is gratifying. Culture I did last week showed a few Enterococcus faecalis. I am going to put him on Augmentin. I talked about ordering an x-ray in my note last week but that does not seem to have happened. We will review reorder the x-ray this week. 1/26; x-ray reordered last week was negative for osteomyelitis. We are using silver alginate on the wound on the third and fifth met heads on the left. He is using a Darco forefoot off loader 2/2; the area on the fifth met head is closed. Third met head is still open with tunneling depth and thick callus. 2/9; the area on the fifth met head remains closed however the third met head again has a small open area on presentation with tunneling in depth and surrounded by thick callus. This looks like a pressure issue. We have been using silver alginate 2/16; the area of the fifth metatarsal head remains closed however the area over the third metatarsal head again is a small open area but with some depth. I do not think this is changed much since last week. He is using Hydrofera Blue with forefoot off loader. He is not able to use a total contact cast on the left leg because he needs his left leg at work [employer]]. Fortunately the wound does not look infected. I changed him to endoform today 2/26; the area of the fifth metatarsal head  remains closed. The area of the third metatarsal head has an even smaller opening this time. I used endoform on this this looks improved. He is offloading this is much as he can and a forefoot off loader on the left. He cannot have a total contact cast because of work responsibilities 3/5; the area on the fifth metatarsal head remains closed the area on the third metatarsal head is also closed on the left foot. Electronic  Signature(s) Signed: 03/03/2020 5:21:33 PM By: Glenn Ham MD Entered By: [name] on 03/03/2020 16:51:43 -------------------------------------------------------------------------------- Physical Exam Details Patient Name: Date of Service: Glenn, [name] 03/03/2020 3:15 PM Medical Record [id number] Patient Account Number: [id number] Date of Birth/Sex: Treating RN: March 14, 1971 (49 y.o. [name] Primary Care Provider: Charlott [name] Other Clinician: Referring Provider: Treating Provider/Extender:[name], [name], [name] in Treatment: 7 Cardiovascular Palpable pedal pulses on the left. Notes Wound exam; The area over the fifth metatarsal head and third metatarsal head are closed today. Viable surface is over both areas no evidence of infection Electronic Signature(s) Signed: 03/03/2020 5:21:33 PM By: Glenn Ham MD Entered By: [name] on 03/03/2020 16:52:30 -------------------------------------------------------------------------------- Physician Orders Details Patient Name: Date of Service: Glenn, [name] 03/03/2020 3:15 PM Medical Record [id number] Patient Account Number: [id number] Date of Birth/Sex: Treating RN: 1971/03/26 (49 y.o. [name] Primary Care Provider: Charlott [name] Other Clinician: Referring Provider: Treating Provider/Extender:[name], [name], [name] in Treatment: 7 Verbal / Phone Orders: No Diagnosis Coding ICD-10 Coding Code Description E11.621 Type 2 diabetes mellitus with foot ulcer L97.522 Non-pressure chronic ulcer of other part of left foot with fat layer exposed L97.521 Non-pressure chronic ulcer of other part of left foot limited to breakdown of skin E11.42 Type 2 diabetes mellitus with diabetic polyneuropathy Discharge From [facility] Discharge from [place] Skin Barriers/Peri-Wound Care Moisturizing lotion - to feet nightly Wound Cleansing May shower  and wash wound with soap and water. Off-Loading Other: - callous pad to protect healed area daily Electronic Signature(s) Signed: 03/03/2020 5:21:33 PM By: Glenn Ham MD Signed: 03/03/2020 5:33:53 PM By: Baruch Gouty RN, BSN Entered By: Baruch Gouty on 03/03/2020 16:05:52 -------------------------------------------------------------------------------- Problem List Details Patient Name: Date of Service: Glenn, [name] 03/03/2020 3:15 PM Medical Record [id number] Patient Account Number: [id number] Date of Birth/Sex: Treating RN: [birth year] (49 y.o. [name] Primary Care Provider: Charlott [name] Other Clinician: Referring Provider: Treating Provider/Extender:Quentez Lober, [name], [name] in Treatment: 7 Active Problems ICD-10 Evaluated Encounter Code Description Active Date Today Diagnosis E11.621 Type 2 diabetes mellitus with foot ulcer 01/11/2020 No Yes L97.522 Non-pressure chronic ulcer of other part of left foot 01/11/2020 No Yes with fat layer exposed L97.521 Non-pressure chronic ulcer of other part of left foot 01/11/2020 No Yes limited to breakdown of skin E11.42 Type 2 diabetes mellitus with diabetic polyneuropathy 01/11/2020 No Yes Inactive Problems Resolved Problems Electronic Signature(s) Signed: 03/03/2020 5:21:33 PM By: Glenn Ham MD Entered By: [name] on 03/03/2020 16:50:59 -------------------------------------------------------------------------------- Progress Note Details Patient Name: Date of Service: Glenn, [name] 03/03/2020 3:15 PM Medical Record [id number] Patient Account Number: [id number] Date of Birth/Sex: Treating RN: 1971/03/04 (48 y.o. [name] Primary Care Provider: Charlott [name] Other Clinician: Referring Provider: Treating Provider/Extender:[name], [name], [name] in Treatment: 7 Subjective History of Present Illness (HPI) ADMISSION 01/11/2020 This is a 49 year old  man with uncontrolled type 2 diabetes with a recent hemoglobin A1c earlier this year of 13.4. He is on insulin and glipizide. He does not take his blood sugars at home.  He does have a follow-up with primary care later this month I believe on January 27. He tells [place] that roughly a month ago he was walking with a shoe with a hole in his foot. He took the shoe off and there was an open wound at roughly the left fourth met head. This has significant undermining and raised edges. He has not noticed any purulence he does not feel unwell. More recently he was taking skin off the bottom of his foot and has a superficial area on the left fifth met head. He has not been offloading this. The patient was in the ER on 12/20. They gave him Bactroban which she has been using on the wound and 10 days worth of doxycycline. No x-rays were done. He has not had vascular studies. He is also been using hydrogen peroxide. Past medical history type 2 diabetes uncontrolled, chronic systolic heart failure, coronary artery disease with a history of congestive heart failure with stents. Hypertension hyperlipidemia and chronic renal insufficiency ABI in this clinic was 1.14 on the left. 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 He is uncertain whether he would be able to work if we put him on some form of restriction 1/19; he is generally doing quite well. Using silver alginate on the wounds. Things actually look better. He has a forefoot offloading boot which she seems to be compliant about. He has support at work to stay off his foot is much as possible which is gratifying. Culture I did last week showed a few Enterococcus faecalis. I am going to put him on Augmentin. I talked about ordering an x-ray in my note last week but that does not seem to have happened. We will review reorder the x-ray this week. 1/26; x-ray reordered last week was negative for osteomyelitis. We are using silver  alginate on the wound on the third and fifth met heads on the left. He is using a Darco forefoot off loader 2/2; the area on the fifth met head is closed. Third met head is still open with tunneling depth and thick callus. 2/9; the area on the fifth met head remains closed however the third met head again has a small open area on presentation with tunneling in depth and surrounded by thick callus. This looks like a pressure issue. We have been using silver alginate 2/16; the area of the fifth metatarsal head remains closed however the area over the third metatarsal head again is a small open area but with some depth. I do not think this is changed much since last week. He is using Hydrofera Blue with forefoot off loader. He is not able to use a total contact cast on the left leg because he needs his left leg at work [employer]]. Fortunately the wound does not look infected. I changed him to endoform today 2/26; the area of the fifth metatarsal head remains closed. The area of the third metatarsal head has an even smaller opening this time. I used endoform on this this looks improved. He is offloading this is much as he can and a forefoot off loader on the left. He cannot have a total contact cast because of work responsibilities 3/5; the area on the fifth metatarsal head remains closed the area on the third metatarsal head is also closed on the left foot. Objective Constitutional Vitals Time Taken: 3:38 PM, Height: 78 in, Weight: 285 lbs, BMI: 32.9, Temperature: 98.2 F, Pulse: 84 bpm, Respiratory  Rate: 18 breaths/min, Blood Pressure: 132/78 mmHg. Cardiovascular Palpable pedal pulses on the left. General Notes: Wound exam; ooThe area over the fifth metatarsal head and third metatarsal head are closed today. Viable surface is over both areas no evidence of infection Integumentary (Hair, Skin) Wound #1 status is Healed - Epithelialized. Original cause of wound was Trauma. The wound is  located on the Left Metatarsal head third. The wound measures 0cm length x 0cm width x 0cm depth; 0cm^2 area and 0cm^3 volume. There is no tunneling or undermining noted. There is a none present amount of drainage noted. The wound margin is thickened. There is no granulation within the wound bed. There is no necrotic tissue within the wound bed. 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 Off-Loading: Other: - callous pad to protect healed area daily 1. The patient can be discharged from the wound care center 2. He was advised to keep these areas padded in his shoe using foam, callus pads, or even kerlix/rolled gauze Electronic Signature(s) Signed: 03/03/2020 5:21:33 PM By: Glenn Ham MD Entered By: [name] on 03/03/2020 16:53:19 -------------------------------------------------------------------------------- SuperBill Details Patient Name: Date of Service: Glenn, [name] 03/03/2020 Medical Record [id number] Patient Account Number: [id number] Date of Birth/Sex: Treating RN: 07/06/1971 (49 y.o. [name] Primary Care Provider: Charlott [name] Other Clinician: Referring Provider: Treating Provider/Extender:Quadry Kampa, [name], [name] in Treatment: 7 Diagnosis Coding ICD-10 Codes Code Description E11.621 Type 2 diabetes mellitus with foot ulcer L97.522 Non-pressure chronic ulcer of other part of left foot with fat layer exposed L97.521 Non-pressure chronic ulcer of other part of left foot limited to  breakdown of skin E11.42 Type 2 diabetes mellitus with diabetic polyneuropathy Facility Procedures CPT4 Code: [id number] Description: 99213 - WOUND CARE VISIT-LEV 3 EST PT Modifier: Quantity: 1 Physician Procedures CPT4 Code Description: [id number] - WC PHYS LEVEL 2 - EST PT ICD-10 Diagnosis Description E11.621 Type 2 diabetes mellitus with foot ulcer L97.522 Non-pressure chronic ulcer of other part of left foot wi L97.521 Non-pressure chronic ulcer of other  part of left foot li E11.42 Type 2 diabetes mellitus with diabetic polyneuropathy Modifier: th fat layer exp mited to breakdo Quantity: 1 osed wn of skin Electronic Signature(s) Signed: 03/03/2020 5:21:33 PM By: Glenn Ham MD Entered By: [name] on 03/03/2020 16:54:17

## 2020-03-06 ENCOUNTER — Other Ambulatory Visit (HOSPITAL_COMMUNITY): Payer: Self-pay | Admitting: Cardiology

## 2020-03-06 MED ORDER — DIGOXIN 125 MCG PO TABS: 125.0000 ug | ORAL_TABLET | Freq: Every day | ORAL | 0 refills | Status: DC

## 2020-03-07 ENCOUNTER — Ambulatory Visit (INDEPENDENT_AMBULATORY_CARE_PROVIDER_SITE_OTHER): Payer: Self-pay

## 2020-03-07 ENCOUNTER — Ambulatory Visit
Admission: EM | Admit: 2020-03-07 | Discharge: 2020-03-07 | Disposition: A | Discharge status: Home / Self Care | Payer: Self-pay | Attending: Emergency Medicine | Admitting: Emergency Medicine

## 2020-03-07 DIAGNOSIS — L089 Local infection of the skin and subcutaneous tissue, unspecified: Secondary | ICD-10-CM

## 2020-03-07 DIAGNOSIS — Z23 Encounter for immunization: Secondary | ICD-10-CM

## 2020-03-07 DIAGNOSIS — E119 Type 2 diabetes mellitus without complications: Secondary | ICD-10-CM

## 2020-03-07 DIAGNOSIS — W268XXA Contact with other sharp object(s), not elsewhere classified, initial encounter: Secondary | ICD-10-CM

## 2020-03-07 DIAGNOSIS — S91331A Puncture wound without foreign body, right foot, initial encounter: Secondary | ICD-10-CM

## 2020-03-07 DIAGNOSIS — M79671 Pain in right foot: Secondary | ICD-10-CM

## 2020-03-07 MED ORDER — TETANUS-DIPHTH-ACELL PERTUSSIS 5-2.5-18.5 LF-MCG/0.5 IM SUSP
0.5000 mL | Freq: Once | INTRAMUSCULAR | Status: AC
  Administered 2020-03-07: 0.5 mL via INTRAMUSCULAR

## 2020-03-07 MED ORDER — AMOXICILLIN-POT CLAVULANATE 875-125 MG PO TABS: 1.0000 | ORAL_TABLET | Freq: Two times a day (BID) | ORAL | 0 refills | Status: DC

## 2020-03-07 NOTE — Discharge Instructions (Signed)
Keep area(s) clean and dry. Take antibiotic as prescribed with food - important to complete course. Follow up with podiatry in 1 week. Return for worsening pain, redness, swelling, discharge, fever.

## 2020-03-07 NOTE — ED Provider Notes (Signed)
[facility]    CSN: [id number] Arrival date & time: 03/07/20  1656      History   Chief Complaint Chief Complaint  Patient presents with  . foot wound    HPI [name] is a 49 y.o. male with history of CAD, hypertension, type 2 diabetes with diabetic neuropathy presenting for right foot wound.  States that he was shaving the callus with a razor on Friday when he cut his foot.  Denies pain, though noticed significant swelling with discoloration towards the top of his foot this morning.  Denies fever, chills, myalgias, arthralgias.  Has not used anything for this.    Past Medical History:  Diagnosis Date  . CAD (coronary artery disease)    NSTEMI 10/2011: LHC 11/04/11: pLAD 90%, mLAD 60-70%, small D2 sub totally occluded at ostium, small OM1 90% ostial, 90% mid, mOM2 30%, oPL 80%, RCA 30%, dPDA 80%, EF 20% with ant AK.  PCI:  Promus DES to pLAD.  [name] Chronic systolic heart failure ([place])   . DM2 (diabetes mellitus, type 2) ([place])   . HTN (hypertension)   . Hyperlipidemia   . Ischemic cardiomyopathy    Echo 11/03/11: mod LVH, mild focal basal septal hypertrophy, EF 15%, grade 2 diast dysfxn, mild MR, mild to mod LAE, mild RVE, mild to mod reduced RVSF.  EF 3/5 50% by echo  . Obesity     Patient Active Problem List   Diagnosis Date Noted  . Testosterone deficiency 11/29/2016  . Acute systolic CHF (congestive heart failure) ([place]) 10/28/2016  . Indigestion 07/11/2016  . Renal insufficiency 07/11/2016  . Chronic systolic heart failure ([place]) 11/13/2011  . CAD 11/13/2011  . Ischemic cardiomyopathy 11/04/2011  . Acute systolic congestive heart failure ([place]) 11/04/2011  . MI, acute, non ST segment elevation ([place]) 11/03/2011  . Diabetes mellitus type 2 in obese ([place]) 12/14/2010  . HYPERLIPIDEMIA 12/14/2010  . Obesity 12/14/2010  . Essential hypertension, benign 12/14/2010    Past Surgical History:  Procedure Laterality Date  . CARDIAC CATHETERIZATION N/A  10/25/2016   Procedure: Right/Left Heart Cath and Coronary Angiography;  Surgeon: Leonie Man, MD;  Location: [place] [facility];  Service: Cardiovascular;  Laterality: N/A;  . CARDIAC CATHETERIZATION N/A 10/25/2016   Procedure: Coronary Stent Intervention;  Surgeon: Leonie Man, MD;  Location: [place] [facility];  Service: Cardiovascular;  Laterality: N/A;  . CARDIAC CATHETERIZATION N/A 10/28/2016   Procedure: Coronary Stent Intervention;  Surgeon: Burnell Blanks, MD;  Location: [place] [facility];  Service: Cardiovascular;  Laterality: N/A;  . LEFT AND RIGHT HEART CATHETERIZATION WITH CORONARY ANGIOGRAM N/A 11/04/2011   Procedure: LEFT AND RIGHT HEART CATHETERIZATION WITH CORONARY ANGIOGRAM;  Surgeon: Minus Breeding, MD;  Location: [facility] C[facility];  Service: Cardiovascular;  Laterality: N/A;  . NONE    . PERCUTANEOUS CORONARY STENT INTERVENTION (PCI-S)  11/04/2011   Procedure: PERCUTANEOUS CORONARY STENT INTERVENTION (PCI-S);  Surgeon: Sherren Mocha, MD;  Location: [facility] CATH LAB;  Service: Cardiovascular;;       Home Medications    Prior to Admission medications   Medication Sig Start Date End Date Taking? Authorizing Provider  amoxicillin-clavulanate (AUGMENTIN) 875-125 MG tablet Take 1 tablet by mouth every 12 (twelve) hours. 03/07/20   Hall-Potvin, [place], PA-C  aspirin 81 MG chewable tablet Chew 1 tablet (81 mg total) by mouth daily. 10/31/16   Shirley Friar, PA-C  atorvastatin (LIPITOR) 80 MG tablet Take 1 tablet (80 mg total) by mouth daily. 02/04/20  Laurey Morale, MD  Blood Glucose Monitoring Suppl (TRUE METRIX METER) DEVI 1 each by Does not apply route 3 (three) times daily. 03/16/19   Hoy Register, MD  carvedilol (COREG) 6.25 MG tablet Take 1 tablet (6.25 mg total) by mouth 2 (two) times daily with a meal. 02/04/20   Laurey Morale, MD  cetirizine (ZYRTEC) 10 MG tablet Take 1 tablet (10 mg total) by mouth daily. 02/21/20   Hoy Register, MD  dapagliflozin  propanediol (FARXIGA) 10 MG TABS tablet Take 10 mg by mouth daily before breakfast. 08/31/19   Laurey Morale, MD  digoxin (LANOXIN) 0.125 MG tablet Take 1 tablet (125 mcg total) by mouth daily. HF fund 03/06/20   Laurey Morale, MD  furosemide (LASIX) 40 MG tablet Take 1 tablet (40 mg total) by mouth daily. 02/04/20   Laurey Morale, MD  glipiZIDE (GLUCOTROL) 10 MG tablet TAKE 1 TABLET BY MOUTH TWICE DAILY BEFORE MEAL(S). 01/25/20   Hoy Register, MD  glucose blood (TRUE METRIX BLOOD GLUCOSE TEST) test strip 1 each by Other route 3 (three) times daily. 03/16/19   Hoy Register, MD  Insulin Glargine (LANTUS SOLOSTAR) 100 UNIT/ML Solostar Pen Inject 20 Units into the skin daily. 01/25/20   Hoy Register, MD  Insulin Pen Needle (TRUEPLUS PEN NEEDLES) 31G X 5 MM MISC USE AS DIRECTED AT BEDTIME. 01/25/20   Hoy Register, MD  mupirocin ointment (BACTROBAN) 2 % Apply 1 application topically 2 (two) times daily. Patient not taking: Reported on 01/25/2020 12/19/19   Belinda Fisher, PA-C  omeprazole (PRILOSEC) 20 MG capsule Take 1 capsule (20 mg total) by mouth daily. 02/04/20   Laurey Morale, MD  sacubitril-valsartan (ENTRESTO) 97-103 MG Take 1 tablet by mouth 2 (two) times daily. 03/02/19   Laurey Morale, MD  sildenafil (REVATIO) 20 MG tablet Take 1 tablet (20 mg total) by mouth as needed (for erectile dysfunction). 08/31/19   Laurey Morale, MD  spironolactone (ALDACTONE) 50 MG tablet Take 1 tablet (50 mg total) by mouth daily. 02/04/20   Laurey Morale, MD  TRUEplus Lancets 28G MISC 1 each by Does not apply route 3 (three) times daily. 03/16/19   Hoy Register, MD    Family History Family History  Problem Relation Age of Onset  . Heart disease Mother        MOTHER HAD CABG    Social History Social History   Tobacco Use  . Smoking status: Never Smoker  . Smokeless tobacco: Never Used  Substance Use Topics  . Alcohol use: Yes    Alcohol/week: 1.0 standard drinks    Types: 1 Shots of liquor  per week    Comment: OCCASIONAL  . Drug use: No     Allergies   Patient has no known allergies.   Review of Systems As per HPI   Physical Exam Triage Vital Signs ED Triage Vitals  Enc Vitals Group     BP 03/07/20 1720 (!) 136/91     Pulse Rate 03/07/20 1720 (!) 102     Resp 03/07/20 1720 18     Temp 03/07/20 1720 98.8 F (37.1 C)     Temp Source 03/07/20 1720 Oral     SpO2 03/07/20 1720 96 %     Weight --      Height --      Head Circumference --      Peak Flow --      Pain Score 03/07/20 1731 3  Pain Loc --      Pain Edu? --      Excl. in GC? --    No data found.  Updated Vital Signs BP (!) 136/91 (BP Location: Left Arm)   Pulse (!) 102   Temp 98.8 F (37.1 C) (Oral)   Resp 18   SpO2 96%   Visual Acuity Right Eye Distance:   Left Eye Distance:   Bilateral Distance:    Right Eye Near:   Left Eye Near:    Bilateral Near:     Physical Exam Constitutional:      General: He is not in acute distress.    Appearance: He is obese. He is not ill-appearing.  HENT:     Head: Normocephalic and atraumatic.  Eyes:     General: No scleral icterus.    Pupils: Pupils are equal, round, and reactive to light.  Cardiovascular:     Rate and Rhythm: Normal rate.  Pulmonary:     Effort: Pulmonary effort is normal. No respiratory distress.     Breath sounds: No wheezing.  Musculoskeletal:        General: Signs of injury present. No swelling or deformity. Normal range of motion.  Skin:    Coloration: Skin is not jaundiced or pale.     Comments: Small superficial wound to lateral aspect of ball of foot.  Patient does have bruising, fluid to dorsal aspect: Appears to be purulent.  Serous fluid comes out of open wound at bottom of foot when pushing on top of foot.  No purulence or bleeding.  Decreased sensation which is chronic/stable for patient.  Neurological:     Mental Status: He is alert and oriented to person, place, and time.      UC Treatments / Results    Labs (all labs ordered are listed, but only abnormal results are displayed) Labs Reviewed - No data to display  EKG   Radiology DG Foot Complete Right  Result Date: 03/07/2020 CLINICAL DATA:  Foot infection EXAM: RIGHT FOOT COMPLETE - 3+ VIEW COMPARISON:  None. FINDINGS: No acute displaced fracture or malalignment. Corticated bony opacity adjacent to the head of the fifth proximal phalanx, possible remote injury. Small plantar calcaneal spur. No bone destruction or erosive change. No soft tissue emphysema or foreign body is seen. IMPRESSION: No acute osseous abnormality Electronically Signed   By: Jasmine Pang M.D.   On: 03/07/2020 18:15    Procedures Procedures (including critical care time)  Medications Ordered in UC Medications  Tdap (BOOSTRIX) injection 0.5 mL (0.5 mLs Intramuscular Given 03/07/20 1818)    Initial Impression / Assessment and Plan / UC Course  I have reviewed the triage vital signs and the nursing notes.  Pertinent labs & imaging results that were available during my care of the patient were reviewed by me and considered in my medical decision making (see chart for details).     Patient afebrile, nontoxic in office today.  Patient does have history of diabetic neuropathy and has superficial wound second to metal object.  Tetanus booster updated today: Patient tolerated well.  Foot x-ray negative for osteomyelitis.  Will start Augmentin, patient to resume mupirocin cream which he has at home from previous diabetic foot ulcer.  Patient to follow-up with podiatry for reassessment in 1 week as well as for routine diabetic foot care.  Return precautions discussed, patient verbalized understanding and is agreeable to plan. Final Clinical Impressions(s) / UC Diagnoses   Final diagnoses:  Puncture wound of  plantar aspect of right foot with infection, initial encounter     Discharge Instructions     Keep area(s) clean and dry. Take antibiotic as prescribed with food  - important to complete course. Follow up with podiatry in 1 week. Return for worsening pain, redness, swelling, discharge, fever.    ED Prescriptions    Medication Sig Dispense Auth. Provider   amoxicillin-clavulanate (AUGMENTIN) 875-125 MG tablet Take 1 tablet by mouth every 12 (twelve) hours. 14 tablet Hall-Potvin, [place], PA-C     PDMP not reviewed this encounter.   Hall-Potvin, [place], [place] 03/07/20 1829

## 2020-03-07 NOTE — ED Triage Notes (Addendum)
Pt states cut the bottom of his rt foot with callus razor on Friday. States today has a spot on side of rt foot with red strikes up foot.

## 2020-03-08 NOTE — Progress Notes (Signed)
[name], [name] ([id number]) Visit Report for 03/03/2020 Arrival Information Details Patient Name: Date of Service: [name], [name] 03/03/2020 3:15 PM Medical Record [id number] Patient Account Number: [id number] Date of Birth/Sex: Treating RN: [birth year] (49 y.o. [name]) Carlene Coria Primary Care Ichiro Chesnut: Charlott Rakes Other Clinician: Referring Cleona Doubleday: Treating Dominique Calvey/Extender:Robson, Sammuel Cooper, Douglass Rivers in Treatment: 7 Visit Information History Since Last Visit All ordered tests and consults were completed: No Patient Arrived: Ambulatory Added or deleted any medications: No Arrival Time: 15:36 Any new allergies or adverse reactions: No Accompanied By: self Had a fall or experienced change in No Transfer Assistance: None activities of daily living that may affect Patient Identification Verified: Yes risk of falls: Secondary Verification Process Completed: Yes Signs or symptoms of abuse/neglect since last No Patient Requires Transmission-Based No visito Precautions: Hospitalized since last visit: No Patient Has Alerts: No Implantable device outside of the clinic excluding No cellular tissue based products placed in the center since last visit: Has Dressing in Place as Prescribed: Yes Pain Present Now: No Electronic Signature(s) Signed: 03/08/2020 7:20:02 AM By: Carlene Coria RN Entered By: Carlene Coria on 03/03/2020 15:38:01 -------------------------------------------------------------------------------- Clinic Level of Care Assessment Details Patient Name: Date of Service: [name], [name] 03/03/2020 3:15 PM Medical Record [id number] Patient Account Number: [id number] Date of Birth/Sex: Treating RN: 08/04/1971 (49 y.o. [name] Primary Care Exavior Kimmons: Charlott Rakes Other Clinician: Referring Merlin Ege: Treating Nickolaus Bordelon/Extender:Robson, Sammuel Cooper, Douglass Rivers in Treatment: 7 Clinic Level of Care Assessment Items TOOL 4 Quantity  Score []  - Use when only an EandM is performed on FOLLOW-UP visit 0 ASSESSMENTS - Nursing Assessment / Reassessment X - Reassessment of Co-morbidities (includes updates in patient status) 1 10 X - Reassessment of Adherence to Treatment Plan 1 5 ASSESSMENTS - Wound and Skin Assessment / Reassessment X - Simple Wound Assessment / Reassessment - one wound 1 5 []  - Complex Wound Assessment / Reassessment - multiple wounds 0 []  - Dermatologic / Skin Assessment (not related to wound area) 0 ASSESSMENTS - Focused Assessment []  - Circumferential Edema Measurements - multi extremities 0 []  - Nutritional Assessment / Counseling / Intervention 0 X - Lower Extremity Assessment (monofilament, tuning fork, pulses) 1 5 []  - Peripheral Arterial Disease Assessment (using hand held doppler) 0 ASSESSMENTS - Ostomy and/or Continence Assessment and Care []  - Incontinence Assessment and Management 0 []  - Ostomy Care Assessment and Management (repouching, etc.) 0 PROCESS - Coordination of Care X - Simple Patient / Family Education for ongoing care 1 15 []  - Complex (extensive) Patient / Family Education for ongoing care 0 X - Staff obtains Programmer, systems, Records, Test Results / Process Orders 1 10 []  - Staff telephones HHA, Nursing Homes / Clarify orders / etc 0 []  - Routine Transfer to another Facility (non-emergent condition) 0 []  - Routine Hospital Admission (non-emergent condition) 0 []  - New Admissions / Biomedical engineer / Ordering NPWT, Apligraf, etc. 0 []  - Emergency Hospital Admission (emergent condition) 0 X - Simple Discharge Coordination 1 10 []  - Complex (extensive) Discharge Coordination 0 PROCESS - Special Needs []  - Pediatric / Minor Patient Management 0 []  - Isolation Patient Management 0 []  - Hearing / Language / Visual special needs 0 []  - Assessment of Community assistance (transportation, D/C planning, etc.) 0 []  - Additional assistance / Altered mentation 0 []  - Support Surface(s)  Assessment (bed, cushion, seat, etc.) 0 INTERVENTIONS - Wound Cleansing / Measurement X - Simple Wound Cleansing - one wound 1 5 []  - Complex Wound Cleansing - multiple wounds 0  X - Wound Imaging (photographs - any number of wounds) 1 5 []  - Wound Tracing (instead of photographs) 0 X - Simple Wound Measurement - one wound 1 5 []  - Complex Wound Measurement - multiple wounds 0 INTERVENTIONS - Wound Dressings X - Small Wound Dressing one or multiple wounds 1 10 []  - Medium Wound Dressing one or multiple wounds 0 []  - Large Wound Dressing one or multiple wounds 0 []  - Application of Medications - topical 0 []  - Application of Medications - injection 0 INTERVENTIONS - Miscellaneous []  - External ear exam 0 []  - Specimen Collection (cultures, biopsies, blood, body fluids, etc.) 0 []  - Specimen(s) / Culture(s) sent or taken to Lab for analysis 0 []  - Patient Transfer (multiple staff / / Similar devices) 0 []  - Simple Staple / Suture removal (25 or less) 0 []  - Complex Staple / Suture removal (26 or more) 0 []  - Hypo / Hyperglycemic Management (close monitor of Blood Glucose) 0 []  - Ankle / Brachial Index (ABI) - do not check if billed separately 0 X - Vital Signs 1 5 Has the patient been seen at the hospital within the last three years: Yes Total Score: 90 Level Of Care: New/Established - Level 3 Electronic Signature(s) Signed: 03/03/2020 5:33:53 PM By: RN, BSN Entered By: on 03/03/2020 16:06:37 -------------------------------------------------------------------------------- Encounter Discharge Information Details Patient Name: Date of Service: [name], [name] 03/03/2020 3:15 PM Medical Record Patient Account Number: Date of Birth/Sex: Treating RN: 09-03-1971 (49 y.o. Primary Care Gabrielle Wakeland: Other Clinician: Referring Andee Chivers: Treating Nimrit Kehres/Extender:Robson, ,  in Treatment: 7 Encounter Discharge Information Items Discharge Condition: Stable Ambulatory Status: Ambulatory Discharge Destination: Home Transportation: Private Auto Accompanied By: self Schedule Follow-up Appointment: No Clinical Summary of Care: Electronic Signature(s) Signed: 03/03/2020 5:34:19 PM By: Zenaida Deed Entered By: Zenaida Deed on 03/03/2020 17:32:58 -------------------------------------------------------------------------------- Lower Extremity Assessment Details Patient Name: Date of Service: [name], [name] 03/03/2020 3:15 PM Medical Record [id number] Patient Account Number: [id number] Date of Birth/Sex: Treating RN: September 03, 1971 (48 y.o. [name]) Hoy Register Primary Care Keyunna Coco: Gweneth Fritter Other Clinician: Referring Anastashia Westerfeld: Treating Franchesca Veneziano/Extender:Robson, Sherlie Ban, Enobong Weeks in Treatment: 7 Edema Assessment Assessed: [Left: No] [Right: No] Edema: [Left: N] [Right: o] Calf Left: Right: Point of Measurement: 36 cm From Medial Instep 39 cm cm Ankle Left: Right: Point of Measurement: 10 cm From Medial Instep 22 cm cm Electronic Signature(s) Signed: 03/08/2020 7:20:02 AM By: Shawn Stall RN Entered By: Shawn Stall on 03/03/2020 15:43:57 -------------------------------------------------------------------------------- Multi Wound Chart Details Patient Name: Date of Service: [name], [name] 03/03/2020 3:15 PM Medical Record [id number] Patient Account Number: [id number] Date of Birth/Sex: Treating RN: 03/12/1971 (49 y.o. [name] Primary Care Vir Whetstine: [name] Other Clinician: Referring Qiara Minetti: Treating Siris Hoos/Extender:Robson, Hoy Register, Enobong Weeks in Treatment: 7 Vital Signs Height(in): 78 Pulse(bpm): 84 Weight(lbs): 285 Blood Pressure(mmHg): 132/78 Body Mass Index(BMI): 33 Temperature(F): 98.2 Respiratory 18 Rate(breaths/min): Photos: [1:No Photos] [N/A:N/A] Wound  Location: [1:Left Metatarsal head third N/A] Wounding Event: [1:Trauma] [N/A:N/A] Primary Etiology: [1:Diabetic Wound/Ulcer of the N/A Lower Extremity] Comorbid History: [1:Congestive Heart Failure, N/A Coronary Artery Disease, Hypertension, Myocardial Infarction, Type II Diabetes, Neuropathy] Date Acquired: [1:12/11/2019] [N/A:N/A] Weeks of Treatment: [1:7] [N/A:N/A] Wound Status: [1:Healed - Epithelialized] [N/A:N/A] Measurements L x W x D 0x0x0 [N/A:N/A] (cm) Area (cm) : [1:0] [N/A:N/A] Volume (cm) : [1:0] [N/A:N/A] % Reduction in Area: [1:100.00%] [N/A:N/A] % Reduction in Volume: 100.00% [N/A:N/A] Classification: [1:Grade 2] [N/A:N/A] Exudate Amount: [  1:None Present] [N/A:N/A] Wound Margin: [1:Thickened] [N/A:N/A] Granulation Amount: [1:None Present (0%)] [N/A:N/A] Necrotic Amount: [1:None Present (0%)] [N/A:N/A] Exposed Structures: [1:Fascia: No Fat Layer (Subcutaneous Tissue) Exposed: No Tendon: No Muscle: No Joint: No Bone: No] [N/A:N/A] Epithelialization: [1:Small (1-33%)] [N/A:N/A] Electronic Signature(s) Signed: 03/03/2020 5:21:33 PM By: Baltazar Najjar MD Signed: 03/06/2020 5:31:36 PM By: Cherylin Mylar Entered By: Baltazar Najjar on 03/03/2020 16:51:06 -------------------------------------------------------------------------------- Multi-Disciplinary Care Plan Details Patient Name: Date of Service: [name], [name] 03/03/2020 3:15 PM Medical Record [id number] Patient Account Number: [id number] Date of Birth/Sex: Treating RN: 04/27/1971 (49 y.o. Damaris Schooner Primary Care Leory Allinson: Hoy Register Other Clinician: Referring Michie Molnar: Treating Galena Logie/Extender:Robson, Gweneth Fritter, Sherlie Ban in Treatment: 7 Active Inactive Electronic Signature(s) Signed: 03/03/2020 5:33:53 PM By: Zenaida Deed RN, BSN Entered By: Zenaida Deed on 03/03/2020 16:02:43 -------------------------------------------------------------------------------- Pain Assessment  Details Patient Name: Date of Service: [name], [name] 03/03/2020 3:15 PM Medical Record [id number] Patient Account Number: [id number] Date of Birth/Sex: Treating RN: [birth year] (48 y.o. [name]) [name] Primary Care Kameshia Madruga: Hoy Register Other Clinician: Referring Shirlena Brinegar: Treating Shalese Strahan/Extender:Robson, Gweneth Fritter, Enobong Weeks in Treatment: 7 Active Problems Location of Pain Severity and Description of Pain Patient Has Paino No Site Locations Pain Management and Medication Current Pain Management: Electronic Signature(s) Signed: 03/08/2020 7:20:02 AM By: [name] RN Entered By: [name] on 03/03/2020 15:39:13 -------------------------------------------------------------------------------- Patient/Caregiver Education Details Patient Name: Date of Service: TEIGEN, BELLIN 3/5/2021andnbsp3:15 PM Medical Record [id number] Patient Account Number: [id number] Date of Birth/Gender: [birth year] (48 y.o. M) Treating RN: Zenaida Deed Primary Care Physician: Hoy Register Other Clinician: Referring Physician: Treating Physician/Extender:Robson, Gweneth Fritter, Sherlie Ban in Treatment: 7 Education Assessment Education Provided To: Patient Education Topics Provided Offloading: Methods: Explain/Verbal Responses: Reinforcements needed, State content correctly Wound/Skin Impairment: Methods: Explain/Verbal Responses: Reinforcements needed, State content correctly Electronic Signature(s) Signed: 03/03/2020 5:33:53 PM By: Zenaida Deed RN, BSN Entered By: Zenaida Deed on 03/03/2020 16:03:06 -------------------------------------------------------------------------------- Wound Assessment Details Patient Name: Date of Service: [name], [name] 03/03/2020 3:15 PM Medical Record [id number] Patient Account Number: [id number] Date of Birth/Sex: Treating RN: February 26, 1971 (49 y.o. Damaris Schooner Primary Care Dalesha Stanback: Hoy Register Other  Clinician: Referring Rilley Stash: Treating Maeci Kalbfleisch/Extender:Robson, Gweneth Fritter, Sherlie Ban in Treatment: 7 Wound Status Wound Number: 1 Primary Diabetic Wound/Ulcer of the Lower Extremity Etiology: Wound Location: Left Metatarsal head third Wound Healed - Epithelialized Wounding Event: Trauma Status: Date Acquired: 12/11/2019 Comorbid Congestive Heart Failure, Coronary Artery Weeks Of Treatment: 7 History: Disease, Hypertension, Myocardial Infarction, Clustered Wound: No Type II Diabetes, Neuropathy Photos Wound Measurements Length: (cm) 0 % Reductio Width: (cm) 0 % Reductio Depth: (cm) 0 Epithelial Area: (cm) 0 Tunneling Volume: (cm) 0 Undermini Wound Description Classification: Grade 2 Foul Odor Wound Margin: Thickened Slough/Fi Exudate Amount: None Present Wound Bed Granulation Amount: None Present (0%) Necrotic Amount: None Present (0%) Fascia Ex Fat Layer Tendon Ex Muscle Ex Joint Exp Bone Expo After Cleansing: No brino No Exposed Structure posed: No (Subcutaneous Tissue) Exposed: No posed: No posed: No osed: No sed: No n in Area: 100% n in Volume: 100% ization: Small (1-33%) : No ng: No Electronic Signature(s) Signed: 03/07/2020 3:29:05 PM By: Benjaman Kindler EMT/HBOT Signed: 03/07/2020 6:17:27 PM By: Zenaida Deed RN, BSN Previous Signature: 03/03/2020 5:33:53 PM Version By: Zenaida Deed RN, BSN Previous Signature: 03/03/2020 5:33:53 PM Version By: Zenaida Deed RN, BSN Entered By: Benjaman Kindler on 03/07/2020 10:54:24 -------------------------------------------------------------------------------- Vitals Details Patient Name: Date of Service: [name], [name] 03/03/2020 3:15 PM Medical Record [id number] Patient Account Number: [id number] Date of Birth/Sex: Treating RN: 1971-01-28 (  49 y.o. [name]) Epps, Lyla Son Primary Care Rebbie Lauricella: Hoy Register Other Clinician: Referring Analei Whinery: Treating Albie Bazin/Extender:Robson, Gweneth Fritter,  Enobong Weeks in Treatment: 7 Vital Signs Time Taken: 15:38 Temperature (F): 98.2 Height (in): 78 Pulse (bpm): 84 Weight (lbs): 285 Respiratory Rate (breaths/min): 18 Body Mass Index (BMI): 32.9 Blood Pressure (mmHg): 132/78 Reference Range: 80 - 120 mg / dl Electronic Signature(s) Signed: 03/08/2020 7:20:02 AM By: [name] RN Entered By: [name] on 03/03/2020 15:38:42

## 2020-03-09 ENCOUNTER — Other Ambulatory Visit: Payer: Self-pay

## 2020-03-09 ENCOUNTER — Ambulatory Visit: Payer: Self-pay | Admitting: Podiatry

## 2020-03-09 ENCOUNTER — Encounter: Payer: Self-pay | Admitting: Podiatry

## 2020-03-09 VITALS — Temp 96.6°F

## 2020-03-09 DIAGNOSIS — L03119 Cellulitis of unspecified part of limb: Secondary | ICD-10-CM

## 2020-03-09 DIAGNOSIS — S91331A Puncture wound without foreign body, right foot, initial encounter: Secondary | ICD-10-CM

## 2020-03-09 DIAGNOSIS — L02619 Cutaneous abscess of unspecified foot: Secondary | ICD-10-CM

## 2020-03-09 NOTE — Addendum Note (Signed)
Addended by: Parthenia Ames on: 03/09/2020 02:42 PM   Modules accepted: Orders

## 2020-03-09 NOTE — Progress Notes (Signed)
  Subjective:  Patient ID: [name], male    DOB: [birth year],  MRN: [id number]  Chief Complaint  Patient presents with  . Wound Check    R plantar forefoot - lateral aspect. Blister and redness that wraps around R dorsal forefoot submet 5. x6 days. Pt stated, "I was recently treated for an ulcer of the L foot, so I've been wearing an offloading shoe. Favoring the R foot, so I had a painful callus. I trimmed it with a blade and barely nicked my skin. After I worked on Monday, I developed a painful blister. Went to urgent care on 3/9 and received Rx for Amoxicillin. XRs did not reveal bone infection. Pain = 7-8/10".  . Diabetes    A1c = 9. Most recent glucose level per pt = 110 mg/dL. Pt stated, "My A1c has actually improved - it was 13".   49 y.o. male presents with the above complaint. History confirmed with patient.   Objective:  Physical Exam: warm, good capillary refill,  normal DP and PT pulses and normal sensory exam. Right Foot: right foot HPJ submet 5, with dorsolateral purulent bulla. Evidence of streaking only to midfoot, has not progressed since starting abx per patient. Upon drainage superficial necrosis. No deep fluctuance. No crepitus.  Radiographs: Prior XR reviewed - ST density no osseous erosions.  Assessment:   1. Cellulitis and abscess of foot   2. Puncture wound of right foot with complication, initial encounter     Plan:  Patient was evaluated and treated and all questions answered.  Right Foot Cellulitis; IDSA Moderate infection. -The streaking has not progressed since starting Abx. There is no active warmth. He has no signs of systemic infection. Continue Abx. Discussed should the wound worsen or he develop signs of infection to call promptly for eval.  -Incision and drainage of abscess performed as below -Culture taken. Will adjust abx if indicated. -F/u in the office next week for check. I will not be in the office - I discussed he will see another  provider for follow-up.  Procedure: Incision and drainage of abscess Anesthesia: 2% lidocaine, 10 ml Instrumentation: 312 blade Technique: Following anesthesia and sterile skin prep with betadine, The bulla was incised. Purulence was encountered and collected for culture. The bulla was excised and deroofed. There was no deep wound. Wound base was viable. Dressing: Dry, sterile, compression dressing. Disposition: Patient tolerated procedure well. Patient to return in 1 week for follow-up.  Return in about 1 week (around 03/16/2020).

## 2020-03-13 ENCOUNTER — Ambulatory Visit (INDEPENDENT_AMBULATORY_CARE_PROVIDER_SITE_OTHER): Payer: Self-pay | Admitting: Podiatry

## 2020-03-13 ENCOUNTER — Telehealth (HOSPITAL_COMMUNITY): Payer: Self-pay | Admitting: Pharmacist

## 2020-03-13 ENCOUNTER — Telehealth: Payer: Self-pay | Admitting: *Deleted

## 2020-03-13 ENCOUNTER — Other Ambulatory Visit: Payer: Self-pay

## 2020-03-13 ENCOUNTER — Encounter: Payer: Self-pay | Admitting: Podiatry

## 2020-03-13 VITALS — Temp 96.2°F

## 2020-03-13 DIAGNOSIS — L03119 Cellulitis of unspecified part of limb: Secondary | ICD-10-CM

## 2020-03-13 DIAGNOSIS — L02619 Cutaneous abscess of unspecified foot: Secondary | ICD-10-CM

## 2020-03-13 DIAGNOSIS — S91331A Puncture wound without foreign body, right foot, initial encounter: Secondary | ICD-10-CM

## 2020-03-13 LAB — WOUND CULTURE
MICRO NUMBER:: 10240909
SPECIMEN QUALITY:: ADEQUATE

## 2020-03-13 MED ORDER — AMPICILLIN 500 MG PO CAPS: 500.0000 mg | ORAL_CAPSULE | Freq: Three times a day (TID) | ORAL | 0 refills | Status: DC

## 2020-03-13 MED ORDER — AMOXICILLIN 500 MG PO CAPS: 500.0000 mg | ORAL_CAPSULE | Freq: Three times a day (TID) | ORAL | 0 refills | Status: DC

## 2020-03-13 MED FILL — glipiZIDE 10 MG TABS: 10 | 30 days supply | Qty: 60 | Fill #1

## 2020-03-13 MED FILL — !LANTUS SOLOSTAR 100UNITS/M: 100 | 20 days supply | Qty: 3 | Fill #8

## 2020-03-13 MED FILL — AMPICILLIN TR 500 MG CAP: 500 | 10 days supply | Qty: 30 | Fill #0

## 2020-03-13 NOTE — Telephone Encounter (Signed)
Sent in Manufacturer's Assistance application to [employer] for Entresto re-enrollment.    Application pending, will continue to follow.   Deontrae Drinkard, PharmD, BCPS, BCCP, CPP Heart Failure Clinic Pharmacist [phone]  

## 2020-03-13 NOTE — Telephone Encounter (Signed)
complete

## 2020-03-13 NOTE — Telephone Encounter (Signed)
Patient is wanting to know if he should finish up his old Antibiotic prescription before starting new one.    Called patient and informed per Dr. Samuella Cota that he should discontinue the old pills (2 left) and start new Antibiotics prescribed by Dr. Ardelle Anton.

## 2020-03-14 ENCOUNTER — Ambulatory Visit: Payer: Self-pay | Admitting: Podiatry

## 2020-03-15 ENCOUNTER — Telehealth: Payer: Self-pay | Admitting: *Deleted

## 2020-03-15 DIAGNOSIS — L02619 Cutaneous abscess of unspecified foot: Secondary | ICD-10-CM

## 2020-03-15 DIAGNOSIS — S91331A Puncture wound without foreign body, right foot, initial encounter: Secondary | ICD-10-CM

## 2020-03-15 NOTE — Telephone Encounter (Signed)
I informed pt of Dr. Gabriel Rung orders and asked pt if he had a current insurance and he stated he was self pay, and would go to imaging that would bill. I told him [facility] would be less expensive and may bill. Faxed orders to [facility].

## 2020-03-15 NOTE — Telephone Encounter (Signed)
-----   Message from Vivi Barrack, DPM sent at 03/13/2020  5:33 PM EDT ----- I put in him ampicillin and going to get an MRI  Val- can you please order an MRI of the right foot to evaluate for abscess? Thanks.

## 2020-03-15 NOTE — Progress Notes (Signed)
Subjective: 49 year old male presents the office today for follow-up evaluation after undergoing incision and drainage of an abscess by Dr. Samuella Cota last week.  He states that this started on antibiotics the red streaks has improved but he still has redness to the area.  He is not sure if it is because of infection or Betadine.  He has no systemic symptoms including fevers, chills, nausea, vomiting.  Denies any calf pain, chest pain, shortness of breath.   Objective: AAO x3, NAD DP/PT pulses palpable bilaterally, CRT less than 3 seconds Sensation decreased. On the plantar aspect submetatarsal 5 hyperkeratotic lesion with central wound present but there is no probing, undermining or tunneling.  Superficial necrosis on the dorsal aspect the foot.  There is surrounding erythema unable to identify any ascending cellulitis.  Patient and the pictures that he showed me this is improved.  There is some edema localized along the fifth metatarsal head but there is no fluctuation or crepitation. No open lesions or pre-ulcerative lesions.  No pain with calf compression, swelling, warmth, erythema      Assessment: Cellulitis right foot  Plan: -All treatment options discussed with the patient including all alternatives, risks, complications.  -I did lightly debride the wound to the any complications.  I recommended MRI to rule out abscess.  We will switch him to ampicillin.  Elevation.  Midline surgical shoe and offloading. -If any worsening he is to report directly to emergency department. -Patient encouraged to call the office with any questions, concerns, change in symptoms.   Follow-up next week with Dr. Samuella Cota or myself or sooner if any issues are to arise.  Vivi Barrack DPM

## 2020-03-16 NOTE — Telephone Encounter (Signed)
Advanced Heart Failure Patient Advocate Encounter   Patient was approved to receive Entresto from [employer].  Patient ID: [id number] Effective dates: 03/16/2020 through 03/23/2021  Karle Plumber, PharmD, BCPS, BCCP, CPP Heart Failure Clinic Pharmacist [phone]

## 2020-03-17 ENCOUNTER — Ambulatory Visit: Payer: Self-pay | Admitting: Podiatry

## 2020-03-21 ENCOUNTER — Telehealth: Payer: Self-pay | Admitting: Podiatry

## 2020-03-21 NOTE — Telephone Encounter (Signed)
Huntley Dec with [facility] called and said, she is working on getting Mr. Foree in. He has declined every appointment she has offered him, so she is working on Company secretary she can do.

## 2020-03-22 NOTE — Telephone Encounter (Signed)
Noted thanks °

## 2020-03-24 ENCOUNTER — Encounter: Payer: Self-pay | Admitting: Podiatry

## 2020-03-24 ENCOUNTER — Other Ambulatory Visit: Payer: Self-pay

## 2020-03-24 ENCOUNTER — Ambulatory Visit (INDEPENDENT_AMBULATORY_CARE_PROVIDER_SITE_OTHER): Payer: Self-pay | Admitting: Podiatry

## 2020-03-24 VITALS — Temp 97.8°F

## 2020-03-24 DIAGNOSIS — L02619 Cutaneous abscess of unspecified foot: Secondary | ICD-10-CM

## 2020-03-24 DIAGNOSIS — L03119 Cellulitis of unspecified part of limb: Secondary | ICD-10-CM

## 2020-03-24 MED ORDER — SILVER SULFADIAZINE 1 % EX CREA: TOPICAL_CREAM | CUTANEOUS | 0 refills | Status: DC

## 2020-03-24 MED ORDER — AMPICILLIN 500 MG PO CAPS: 500.0000 mg | ORAL_CAPSULE | Freq: Three times a day (TID) | ORAL | 0 refills | Status: DC

## 2020-03-24 MED FILL — !LANTUS SOLOSTAR 100UNITS/M: 100 | 30 days supply | Qty: 6 | Fill #0

## 2020-03-24 MED FILL — CARVEDILOL 6.25 MG TABLET: 6.25 | 30 days supply | Qty: 60 | Fill #1

## 2020-03-24 MED FILL — SPIRONOLACTONE 50 MG TABLET: 50 | 30 days supply | Qty: 30 | Fill #2

## 2020-03-24 MED FILL — ATORVASTATIN 80 MG TABLET: 80 | 30 days supply | Qty: 30 | Fill #1

## 2020-03-24 MED FILL — DIGOXIN 0.125 MG TABLET: 125 | 30 days supply | Qty: 30 | Fill #0

## 2020-03-24 MED FILL — SSD 1% CREAM: 1 | 30 days supply | Qty: 25 | Fill #0

## 2020-03-24 MED FILL — AMPICILLIN TR 500 MG CAP: 500 | 10 days supply | Qty: 30 | Fill #0

## 2020-03-30 ENCOUNTER — Other Ambulatory Visit: Payer: Self-pay

## 2020-03-30 ENCOUNTER — Ambulatory Visit (INDEPENDENT_AMBULATORY_CARE_PROVIDER_SITE_OTHER): Payer: Self-pay | Admitting: Podiatry

## 2020-03-30 DIAGNOSIS — L97511 Non-pressure chronic ulcer of other part of right foot limited to breakdown of skin: Secondary | ICD-10-CM

## 2020-03-30 DIAGNOSIS — E08621 Diabetes mellitus due to underlying condition with foot ulcer: Secondary | ICD-10-CM

## 2020-03-30 NOTE — Progress Notes (Signed)
  Subjective:  Patient ID: [name], male    DOB: Oct 19, 1971,  MRN: [id number]  Chief Complaint  Patient presents with  . Wound Check    Pt states improvement and denies fever/chills/nausea/vomiting. Pt states he is still on antibiotics and his redness/pain has decreased.   49 y.o. male presents with the above complaint. History confirmed with patient.   Objective:  Physical Exam: warm, good capillary refill,  normal DP and PT pulses and normal sensory exam. Right Foot: right foot ulcer wihtout active erythema, no warmth. Slight periwound pink tissue. Wound base mostly granular. Measures 3x2   Assessment:   1. Diabetic ulcer of other part of right foot associated with diabetes mellitus due to underlying condition, limited to breakdown of skin [facility])     Plan:  Patient was evaluated and treated and all questions answered.  Right Foot Ulcer. -Improving. -Continue dressing with silvadene ointment -Debrided, dressed with prisma and mepilex border dressing today.  Procedure: Selective Debridement of Wound Rationale: Removal of devitalized tissue from the wound to promote healing.  Pre-Debridement Wound Measurements: 3 cm x 2 cm x 0.2 cm  Post-Debridement Wound Measurements: same as pre-debridement. Type of Debridement: sharp selective Tissue Removed: Devitalized soft-tissue Dressing: Dry, sterile, compression dressing. Disposition: Patient tolerated procedure well. Patient to return in 1 week for follow-up.    No follow-ups on file.

## 2020-04-05 ENCOUNTER — Other Ambulatory Visit (HOSPITAL_COMMUNITY): Payer: Self-pay | Admitting: Cardiology

## 2020-04-05 MED FILL — FUROSEMIDE 40 MG TAB: 40 | 30 days supply | Qty: 30 | Fill #0

## 2020-04-06 MED FILL — OMEPRAZOLE DR 20 MG CAPSULE: 20 | 30 days supply | Qty: 30 | Fill #0

## 2020-04-07 ENCOUNTER — Encounter (HOSPITAL_COMMUNITY): Payer: Self-pay | Admitting: Cardiology

## 2020-04-07 ENCOUNTER — Ambulatory Visit (INDEPENDENT_AMBULATORY_CARE_PROVIDER_SITE_OTHER): Payer: Self-pay | Admitting: Podiatry

## 2020-04-07 ENCOUNTER — Other Ambulatory Visit: Payer: Self-pay

## 2020-04-07 DIAGNOSIS — L97511 Non-pressure chronic ulcer of other part of right foot limited to breakdown of skin: Secondary | ICD-10-CM

## 2020-04-07 DIAGNOSIS — E08621 Diabetes mellitus due to underlying condition with foot ulcer: Secondary | ICD-10-CM

## 2020-04-07 DIAGNOSIS — L02619 Cutaneous abscess of unspecified foot: Secondary | ICD-10-CM

## 2020-04-07 DIAGNOSIS — L03119 Cellulitis of unspecified part of limb: Secondary | ICD-10-CM

## 2020-04-12 NOTE — Progress Notes (Signed)
JOVE, BEYL ([id number]) Visit Report for 02/08/2020 Arrival Information Details Patient Name: Date of Service: KAIRO, LAUBACHER 02/08/2020 10:45 AM Medical Record [id number] Patient Account Number: [id number] Date of Birth/Sex: Treating RN: 08-06-1971 (48 y.o. [name]) Carlene Coria Primary Care Braxtyn Dorff: Charlott Rakes Other Clinician: Referring Alleta Avery: Treating Taren Toops/Extender:Robson, Sammuel Cooper, Douglass Rivers in Treatment: 4 Visit Information History Since Last Visit Added or deleted any medications: No Patient Arrived: Ambulatory Any new allergies or adverse reactions: No Arrival Time: 11:56 Had a fall or experienced change in No Accompanied By: self activities of daily living that may affect Transfer Assistance: None risk of falls: Patient Identification Verified: Yes Signs or symptoms of abuse/neglect since last No Secondary Verification Process Completed: Yes visito Patient Requires Transmission-Based No Hospitalized since last visit: No Precautions: Implantable device outside of the clinic excluding No Patient Has Alerts: No cellular tissue based products placed in the center since last visit: Has Dressing in Place as Prescribed: Yes Pain Present Now: No Electronic Signature(s) Signed: 04/12/2020 9:23:26 AM By: Sandre Kitty Entered By: Sandre Kitty on 02/08/2020 11:57:03 -------------------------------------------------------------------------------- Encounter Discharge Information Details Patient Name: Date of Service: AHAD, COLARUSSO 02/08/2020 10:45 AM Medical Record [id number] Patient Account Number: [id number] Date of Birth/Sex: Treating RN: [birth year] (49 y.o. [name] Primary Care Hilde Churchman: Charlott Rakes Other Clinician: Referring Nilesh Stegall: Treating Denaja Verhoeven/Extender:Robson, Sammuel Cooper, Douglass Rivers in Treatment: 4 Encounter Discharge Information Items Post Procedure Vitals Discharge Condition:  Stable Temperature (F): 98.3 Ambulatory Status: Ambulatory Pulse (bpm): 84 Discharge Destination: Home Respiratory Rate (breaths/min): 18 Transportation: Private Auto Blood Pressure (mmHg): 102/67 Accompanied By: self Schedule Follow-up Appointment: Yes Clinical Summary of Care: Patient Declined Electronic Signature(s) Signed: 02/08/2020 5:16:17 PM By: Kela Millin Entered By: Kela Millin on 02/08/2020 12:33:22 -------------------------------------------------------------------------------- Lower Extremity Assessment Details Patient Name: Date of Service: JAMAI, DOLCE 02/08/2020 10:45 AM Medical Record [id number] Patient Account Number: [id number] Date of Birth/Sex: Treating RN: 1971/11/08 (48 y.o. [name]) Carlene Coria Primary Care Coby Shrewsberry: Charlott Rakes Other Clinician: Referring Tyleah Loh: Treating Terricka Onofrio/Extender:Robson, Sammuel Cooper, Enobong Weeks in Treatment: 4 Edema Assessment Assessed: [Left: No] [Right: No] Edema: [Left: N] [Right: o] Calf Left: Right: Point of Measurement: 36 cm From Medial Instep 39 cm cm Ankle Left: Right: Point of Measurement: 10 cm From Medial Instep 22 cm cm Electronic Signature(s) Signed: 02/08/2020 5:19:34 PM By: Carlene Coria RN Entered By: Carlene Coria on 02/08/2020 12:06:59 -------------------------------------------------------------------------------- Multi Wound Chart Details Patient Name: Date of Service: KRYSTOFER, HEVENER 02/08/2020 10:45 AM Medical Record [id number] Patient Account Number: [id number] Date of Birth/Sex: Treating RN: [birth year] (48 y.o. [name]) Carlene Coria Primary Care Tabetha Haraway: Charlott Rakes Other Clinician: Referring Armonte Tortorella: Treating Arianni Gallego/Extender:Robson, Sammuel Cooper, Enobong Weeks in Treatment: 4 Vital Signs Height(in): 78 Pulse(bpm): 46 Weight(lbs): 285 Blood Pressure(mmHg): 102/67 Body Mass Index(BMI): 33 Temperature(F): 98.3 Respiratory 18 Rate(breaths/min): Photos:  [1:No Photos] [N/A:N/A] Wound Location: [1:Left Metatarsal head third N/A] Wounding Event: [1:Trauma] [N/A:N/A] Primary Etiology: [1:Diabetic Wound/Ulcer of the N/A Lower Extremity] Comorbid History: [1:Congestive Heart Failure, N/A Coronary Artery Disease, Hypertension, Myocardial Infarction, Type II Diabetes, Neuropathy] Date Acquired: [1:12/11/2019] [N/A:N/A] Weeks of Treatment: [1:4] [N/A:N/A] Wound Status: [1:Open] [N/A:N/A] Measurements L x W x D 0.2x0.3x0.3 [N/A:N/A] (cm) Area (cm) : [1:0.047] [N/A:N/A] Volume (cm) : [1:0.014] [N/A:N/A] % Reduction in Area: [1:94.00%] [N/A:N/A] % Reduction in Volume: 96.40% [N/A:N/A] Starting Position 1 [1:3] (o'clock): Ending Position 1 [1:7] (o'clock): Maximum Distance 1 [1:0.3] (cm): Undermining: [1:Yes] [N/A:N/A] Classification: [1:Grade 2] [N/A:N/A] Exudate Amount: [1:Small] [N/A:N/A] Exudate Type: [1:Serosanguineous] [N/A:N/A] Exudate Color: [1:red, brown] [N/A:N/A] Wound Margin: [1:Flat and  Intact] [N/A:N/A] Granulation Amount: [1:Large (67-100%)] [N/A:N/A] Granulation Quality: [1:Pink, Pale] [N/A:N/A] Necrotic Amount: [1:Small (1-33%)] [N/A:N/A] Exposed Structures: [1:Fat Layer (Subcutaneous N/A Tissue) Exposed: Yes Fascia: No Tendon: No Muscle: No Joint: No Bone: No] Epithelialization: [1:Small (1-33%)] [N/A:N/A] Debridement: [1:Debridement - Excisional N/A] Pre-procedure [1:12:12] [N/A:N/A] Verification/Time Out Taken: Pain Control: [1:Other] [N/A:N/A] Tissue Debrided: [1:Callus, Subcutaneous] [N/A:N/A] Level: [1:Skin/Subcutaneous Tissue N/A] Debridement Area (sq cm):0.06 [N/A:N/A] Instrument: [1:Curette] [N/A:N/A] Bleeding: [1:Moderate] [N/A:N/A] Hemostasis Achieved: [1:Pressure] [N/A:N/A] Procedural Pain: [1:0] [N/A:N/A] Post Procedural Pain: [1:0] [N/A:N/A] Debridement Treatment [1:Procedure was tolerated] [N/A:N/A] Response: [1:well] Post Debridement [1:0.2x0.3x0.3] [N/A:N/A] Measurements L x W x D (cm) Post  Debridement [1:0.014] [N/A:N/A] Volume: (cm) Procedures Performed: [1:Debridement] [N/A:N/A] Treatment Notes Wound #1 (Left Metatarsal head third) 1. Cleanse With Wound Cleanser 2. Periwound Care Skin Prep 3. Primary Dressing Applied Hydrofera Blue 4. Secondary Dressing Dry Gauze Roll Gauze 5. Secured With Tape 7. Footwear/Offloading device applied Other footwear/offloading device (specify in notes) Notes front Research scientist (medical)) Signed: 02/08/2020 5:08:08 PM By: Baltazar Najjar MD Signed: 02/08/2020 5:19:34 PM By: [name] RN Entered By: Baltazar Najjar on 02/08/2020 13:07:08 -------------------------------------------------------------------------------- Multi-Disciplinary Care Plan Details Patient Name: Date of Service: ASUNCION, SHIBATA 02/08/2020 10:45 AM Medical Record [id number] Patient Account Number: [id number] Date of Birth/Sex: Treating RN: Oct 17, 1971 (48 y.o. [name] Primary Care Kevina Piloto: Hoy Register Other Clinician: Referring Bulmaro Feagans: Treating Giacomo Valone/Extender:Robson, Gweneth Fritter, Enobong Weeks in Treatment: 4 Active Inactive Wound/Skin Impairment Nursing Diagnoses: Knowledge deficit related to ulceration/compromised skin integrity Goals: Patient/caregiver will verbalize understanding of skin care regimen Date Initiated: 01/11/2020 Target Resolution Date: 03/03/2020 Goal Status: Active Ulcer/skin breakdown will have a volume reduction of 30% by week 4 Date Initiated: 01/11/2020 Date Inactivated: 02/08/2020 Target Resolution Date: 02/04/2020 Goal Status: Unmet Unmet Reason: comorbities Ulcer/skin breakdown will have a volume reduction of 50% by week 8 Date Initiated: 02/08/2020 Target Resolution Date: 03/03/2020 Goal Status: Active Interventions: Assess patient/caregiver ability to obtain necessary supplies Assess patient/caregiver ability to perform ulcer/skin care regimen upon admission and as needed Assess ulceration(s)  every visit Notes: Electronic Signature(s) Signed: 02/08/2020 5:19:34 PM By: [name] RN Entered By: [name] on 02/08/2020 12:16:34 -------------------------------------------------------------------------------- Pain Assessment Details Patient Name: Date of Service: CHRISHON, MARTINO 02/08/2020 10:45 AM Medical Record [id number] Patient Account Number: [id number] Date of Birth/Sex: Treating RN: Dec 06, 1971 (48 y.o. [name] Primary Care Kezia Benevides: Hoy Register Other Clinician: Referring Ovid Witman: Treating Jerrard Bradburn/Extender:Robson, Gweneth Fritter, Odette Horns Weeks in Treatment: 4 Active Problems Location of Pain Severity and Description of Pain Patient Has Paino No Site Locations Pain Management and Medication Current Pain Management: Electronic Signature(s) Signed: 02/08/2020 5:19:34 PM By: [name] RN Signed: 04/12/2020 9:23:26 AM By: Karl Ito Entered By: Karl Ito on 02/08/2020 11:57:37 -------------------------------------------------------------------------------- Patient/Caregiver Education Details Patient Name: Date of Service: BRALEN, WILTGEN 2/9/2021andnbsp10:45 AM Medical Record [phone] Patient Account Number: [id number] Date of Birth/Gender: [birth year] (49 y.o. M) Treating RN: [name] Primary Care Physician: Hoy Register Other Clinician: Referring Physician: Treating Physician/Extender:Robson, Gweneth Fritter, Sherlie Ban in Treatment: 4 Education Assessment Education Provided To: Patient Education Topics Provided Wound/Skin Impairment: Methods: Explain/Verbal Responses: State content correctly Electronic Signature(s) Signed: 02/08/2020 5:19:34 PM By: [name] RN Entered By: [name] on 02/08/2020 11:27:43 -------------------------------------------------------------------------------- Wound Assessment Details Patient Name: Date of Service: SHADD, DUNSTAN 02/08/2020 10:45 AM Medical Record  [id number] Patient Account Number: [id number] Date of Birth/Sex: Treating RN: Jan 12, 1971 (48 y.o. [name] Primary Care Yiselle Babich: Hoy Register Other Clinician: Referring Tanya Marvin: Treating Jakari Sada/Extender:Robson, Gweneth Fritter, Enobong Weeks in Treatment: 4 Wound  Status Wound Number: 1 Primary Diabetic Wound/Ulcer of the Lower Extremity Etiology: Wound Location: Left Metatarsal head third Wound Open Wound Open Wounding Event: Trauma Status: Date Acquired: 12/11/2019 Comorbid Congestive Heart Failure, Coronary Artery Weeks Of Treatment: 4 History: Disease, Hypertension, Myocardial Infarction, Clustered Wound: No Type II Diabetes, Neuropathy Photos Wound Measurements Length: (cm) 0.2 Width: (cm) 0.3 Depth: (cm) 0.3 Area: (cm) 0.047 Volume: (cm) 0.014 % Reduction in Area: 94% % Reduction in Volume: 96.4% Epithelialization: Small (1-33%) Tunneling: No Undermining: Yes Starting Position (o'clock): 3 Ending Position (o'clock): 7 Maximum Distance: (cm) 0.3 Wound Description Classification: Grade 2 Foul Odo Wound Margin: Flat and Intact Slough/F Exudate Amount: Small Exudate Type: Serosanguineous Exudate Color: red, brown Wound Bed Granulation Amount: Large (67-100%) Granulation Quality: Pink, Pale Fascia Ex Necrotic Amount: Small (1-33%) Fat Layer Necrotic Quality: Adherent Slough Tendon Ex Muscle Ex Joint Exp Bone Expo r After Cleansing: No ibrino Yes Exposed Structure posed: No (Subcutaneous Tissue) Exposed: Yes posed: No posed: No osed: No sed: No Electronic Signature(s) Signed: 02/09/2020 4:31:42 PM By: Benjaman Kindler EMT/HBOT Signed: 02/09/2020 4:53:37 PM By: [name] RN Previous Signature: 02/08/2020 5:19:34 PM Version By: [name] RN Entered By: Benjaman Kindler on 02/09/2020 11:41:07 -------------------------------------------------------------------------------- Vitals Details Patient Name: Date of Service: JEARLD, HEMP  02/08/2020 10:45 AM Medical Record [id number] Patient Account Number: [id number] Date of Birth/Sex: Treating RN: 11-Dec-1971 (48 y.o. [name]) Jettie Pagan, [place] Primary Care Lovie Agresta: Hoy Register Other Clinician: Referring Okla Qazi: Treating Khaylee Mcevoy/Extender:Robson, Gweneth Fritter, Enobong Weeks in Treatment: 4 Vital Signs Time Taken: 11:57 Temperature (F): 98.3 Height (in): 78 Pulse (bpm): 84 Weight (lbs): 285 Respiratory Rate (breaths/min): 18 Body Mass Index (BMI): 32.9 Blood Pressure (mmHg): 102/67 Reference Range: 80 - 120 mg / dl Electronic Signature(s) Signed: 04/12/2020 9:23:26 AM By: Karl Ito Entered By: Karl Ito on 02/08/2020 11:59:24

## 2020-04-19 MED FILL — glipiZIDE 10 MG TABS: 10 | 90 days supply | Qty: 180 | Fill #2

## 2020-04-21 ENCOUNTER — Ambulatory Visit: Payer: Self-pay | Admitting: Podiatry

## 2020-04-24 ENCOUNTER — Ambulatory Visit: Payer: Self-pay | Admitting: Family Medicine

## 2020-05-01 MED FILL — SPIRONOLACTONE 50 MG TABLET: 50 | 30 days supply | Qty: 30 | Fill #3

## 2020-05-01 MED FILL — OMEPRAZOLE DR 20 MG CAPSULE: 20 | 30 days supply | Qty: 30 | Fill #1

## 2020-05-02 MED FILL — glipiZIDE 10 MG TABS: 10 | 90 days supply | Qty: 180 | Fill #2

## 2020-05-04 ENCOUNTER — Other Ambulatory Visit: Payer: Self-pay

## 2020-05-04 ENCOUNTER — Ambulatory Visit (INDEPENDENT_AMBULATORY_CARE_PROVIDER_SITE_OTHER): Payer: Self-pay

## 2020-05-04 ENCOUNTER — Ambulatory Visit (INDEPENDENT_AMBULATORY_CARE_PROVIDER_SITE_OTHER): Payer: Self-pay | Admitting: Podiatry

## 2020-05-04 DIAGNOSIS — E11621 Type 2 diabetes mellitus with foot ulcer: Secondary | ICD-10-CM

## 2020-05-04 DIAGNOSIS — M79672 Pain in left foot: Secondary | ICD-10-CM

## 2020-05-04 DIAGNOSIS — L97423 Non-pressure chronic ulcer of left heel and midfoot with necrosis of muscle: Secondary | ICD-10-CM

## 2020-05-04 DIAGNOSIS — L97421 Non-pressure chronic ulcer of left heel and midfoot limited to breakdown of skin: Secondary | ICD-10-CM

## 2020-05-04 DIAGNOSIS — E08621 Diabetes mellitus due to underlying condition with foot ulcer: Secondary | ICD-10-CM

## 2020-05-04 MED FILL — DIGOXIN 0.125 MG TABLET: 125 | 30 days supply | Qty: 30 | Fill #0

## 2020-05-05 ENCOUNTER — Other Ambulatory Visit: Payer: Self-pay | Admitting: Podiatry

## 2020-05-05 DIAGNOSIS — L97423 Non-pressure chronic ulcer of left heel and midfoot with necrosis of muscle: Secondary | ICD-10-CM

## 2020-05-05 DIAGNOSIS — E11621 Type 2 diabetes mellitus with foot ulcer: Secondary | ICD-10-CM

## 2020-05-09 ENCOUNTER — Telehealth: Payer: Self-pay | Admitting: *Deleted

## 2020-05-09 NOTE — Telephone Encounter (Signed)
I spoke pt and he states he has hard dry skin around the wound that you can see under and bright pink in the center. I told pt that was not unusual and I would see if Dr. Samuella Cota had other instructions.

## 2020-05-09 NOTE — Telephone Encounter (Signed)
Pt called states he needs to speak to someone about his foot.

## 2020-05-11 ENCOUNTER — Telehealth: Payer: Self-pay | Admitting: *Deleted

## 2020-05-11 NOTE — Telephone Encounter (Signed)
Patient is requesting an antibiotic for his foot that is oozing pus again. He has an appointment next Friday w/ Dr. Samuella Cota.

## 2020-05-11 NOTE — Telephone Encounter (Signed)
I spoke with pt and offered an appt for tomorrow and he accepted a 8:15am.

## 2020-05-11 NOTE — Telephone Encounter (Signed)
Pt called stating he would like to know if Dr.Price could call in an antibiotic and now the wound has some oozing please advise

## 2020-05-12 ENCOUNTER — Other Ambulatory Visit: Payer: Self-pay

## 2020-05-12 ENCOUNTER — Ambulatory Visit (INDEPENDENT_AMBULATORY_CARE_PROVIDER_SITE_OTHER): Payer: Self-pay | Admitting: Podiatry

## 2020-05-12 VITALS — Temp 96.7°F

## 2020-05-12 DIAGNOSIS — E08621 Diabetes mellitus due to underlying condition with foot ulcer: Secondary | ICD-10-CM

## 2020-05-12 DIAGNOSIS — L97421 Non-pressure chronic ulcer of left heel and midfoot limited to breakdown of skin: Secondary | ICD-10-CM

## 2020-05-12 MED ORDER — CEPHALEXIN 500 MG PO CAPS: 500.0000 mg | ORAL_CAPSULE | Freq: Two times a day (BID) | ORAL | 0 refills | Status: DC

## 2020-05-12 NOTE — Progress Notes (Signed)
  Subjective:  Patient ID: [name], male    DOB: 03-Jun-1971,  MRN: [id number]  Chief Complaint  Patient presents with  . Wound Check    L plantar forefoot submet 5. Pt stated, "No drainage last week. I had clear drainage after work on Tuesday and Wednesday (3 days ago). There was a small amount of yellow, gooey drainage after that. Wondered if that was the Prisma dissolving. No increased swelling/redness/pain. No fever/chills/N&V'.    49 y.o. male presents for wound care. Hx confirmed with patient.  Objective:  Physical Exam: Wound Location: left 5th MPJ Wound Measurement: 2x2 Wound Base: Granular/Healthy Peri-wound: Calloused Exudate: Scant/small amount Serosanguinous exudate Only small amount of periwound redness.  Assessment:   1. Diabetic ulcer of left midfoot associated with diabetes mellitus due to underlying condition, limited to breakdown of skin [facility])      Plan:  Patient was evaluated and treated and all questions answered.  Ulcer Left 5th Met -Wound base cauterized with silver nitrate -Dressing applied consisting of mepilex border dressing -Wound cleansed and debrided  Procedure: Selective Debridement of Wound Rationale: Removal of devitalized tissue from the wound to promote healing.  Pre-Debridement Wound Measurements: 2 cm x 2 cm x 0.2 cm  Post-Debridement Wound Measurements: same as pre-debridement. Type of Debridement: sharp selective Tissue Removed: Devitalized soft-tissue Dressing: Dry, sterile, compression dressing. Disposition: Patient tolerated procedure well. Patient to return in 1 week for follow-up.   Return in about 1 week (around 05/19/2020).

## 2020-05-12 NOTE — Telephone Encounter (Signed)
Pt was seen today and issues addressed.

## 2020-05-19 ENCOUNTER — Ambulatory Visit: Payer: Self-pay | Admitting: Podiatry

## 2020-05-22 ENCOUNTER — Other Ambulatory Visit (HOSPITAL_COMMUNITY): Payer: Self-pay | Admitting: Cardiology

## 2020-05-22 MED FILL — CARVEDILOL 6.25 MG TABLET: 6.25 | 30 days supply | Qty: 60 | Fill #2

## 2020-05-22 MED FILL — FUROSEMIDE 40 MG TAB: 40 | 30 days supply | Qty: 30 | Fill #1

## 2020-05-22 MED FILL — OMEPRAZOLE DR 20 MG CAPSULE: 20 | 30 days supply | Qty: 30 | Fill #2

## 2020-05-24 ENCOUNTER — Other Ambulatory Visit (HOSPITAL_COMMUNITY): Payer: Self-pay

## 2020-05-24 MED ORDER — SPIRONOLACTONE 50 MG PO TABS: 50.0000 mg | ORAL_TABLET | Freq: Every day | ORAL | 2 refills | Status: DC

## 2020-05-24 MED FILL — SPIRONOLACTONE 50 MG TABLET: 50 | 30 days supply | Qty: 30 | Fill #0

## 2020-05-24 NOTE — Telephone Encounter (Signed)
Meds ordered this encounter  Medications  . spironolactone (ALDACTONE) 50 MG tablet    Sig: Take 1 tablet (50 mg total) by mouth daily.    Dispense:  30 tablet    Refill:  2    [facility] fund

## 2020-05-25 ENCOUNTER — Ambulatory Visit (INDEPENDENT_AMBULATORY_CARE_PROVIDER_SITE_OTHER): Payer: Self-pay | Admitting: Podiatry

## 2020-05-25 ENCOUNTER — Other Ambulatory Visit: Payer: Self-pay

## 2020-05-25 DIAGNOSIS — E08621 Diabetes mellitus due to underlying condition with foot ulcer: Secondary | ICD-10-CM

## 2020-05-25 DIAGNOSIS — L97421 Non-pressure chronic ulcer of left heel and midfoot limited to breakdown of skin: Secondary | ICD-10-CM

## 2020-05-25 MED ORDER — CEPHALEXIN 500 MG PO CAPS: 500.0000 mg | ORAL_CAPSULE | Freq: Two times a day (BID) | ORAL | 0 refills | Status: DC

## 2020-05-25 MED FILL — CEPHALEXIN 500 MG CAPSULE: 500 | 7 days supply | Qty: 14 | Fill #0

## 2020-05-26 ENCOUNTER — Ambulatory Visit (HOSPITAL_COMMUNITY)
Admission: RE | Admit: 2020-05-26 | Discharge: 2020-05-26 | Disposition: A | Discharge status: Home / Self Care | Payer: Self-pay | Source: Ambulatory Visit | Attending: Cardiology | Admitting: Cardiology

## 2020-05-26 ENCOUNTER — Encounter (HOSPITAL_COMMUNITY): Payer: Self-pay | Admitting: Cardiology

## 2020-05-26 VITALS — BP 92/68 | HR 86 | Ht 78.0 in | Wt 278.6 lb

## 2020-05-26 DIAGNOSIS — L97529 Non-pressure chronic ulcer of other part of left foot with unspecified severity: Secondary | ICD-10-CM | POA: Insufficient documentation

## 2020-05-26 DIAGNOSIS — Z79899 Other long term (current) drug therapy: Secondary | ICD-10-CM | POA: Insufficient documentation

## 2020-05-26 DIAGNOSIS — Z7982 Long term (current) use of aspirin: Secondary | ICD-10-CM | POA: Insufficient documentation

## 2020-05-26 DIAGNOSIS — Z955 Presence of coronary angioplasty implant and graft: Secondary | ICD-10-CM | POA: Insufficient documentation

## 2020-05-26 DIAGNOSIS — L97519 Non-pressure chronic ulcer of other part of right foot with unspecified severity: Secondary | ICD-10-CM | POA: Insufficient documentation

## 2020-05-26 DIAGNOSIS — E11621 Type 2 diabetes mellitus with foot ulcer: Secondary | ICD-10-CM | POA: Insufficient documentation

## 2020-05-26 DIAGNOSIS — I2510 Atherosclerotic heart disease of native coronary artery without angina pectoris: Secondary | ICD-10-CM | POA: Insufficient documentation

## 2020-05-26 DIAGNOSIS — Z794 Long term (current) use of insulin: Secondary | ICD-10-CM | POA: Insufficient documentation

## 2020-05-26 DIAGNOSIS — I5022 Chronic systolic (congestive) heart failure: Secondary | ICD-10-CM | POA: Insufficient documentation

## 2020-05-26 DIAGNOSIS — E785 Hyperlipidemia, unspecified: Secondary | ICD-10-CM | POA: Insufficient documentation

## 2020-05-26 DIAGNOSIS — E669 Obesity, unspecified: Secondary | ICD-10-CM | POA: Insufficient documentation

## 2020-05-26 DIAGNOSIS — I255 Ischemic cardiomyopathy: Secondary | ICD-10-CM | POA: Insufficient documentation

## 2020-05-26 DIAGNOSIS — Z8249 Family history of ischemic heart disease and other diseases of the circulatory system: Secondary | ICD-10-CM | POA: Insufficient documentation

## 2020-05-26 DIAGNOSIS — I252 Old myocardial infarction: Secondary | ICD-10-CM | POA: Insufficient documentation

## 2020-05-26 DIAGNOSIS — I110 Hypertensive heart disease with heart failure: Secondary | ICD-10-CM | POA: Insufficient documentation

## 2020-05-26 LAB — HEPATIC FUNCTION PANEL
ALT: 23 U/L (ref 0–44)
AST: 19 U/L (ref 15–41)
Albumin: 4 g/dL (ref 3.5–5.0)
Alkaline Phosphatase: 48 U/L (ref 38–126)
Bilirubin, Direct: 0.1 mg/dL (ref 0.0–0.2)
Indirect Bilirubin: 0.9 mg/dL (ref 0.3–0.9)
Total Bilirubin: 1 mg/dL (ref 0.3–1.2)
Total Protein: 7.5 g/dL (ref 6.5–8.1)

## 2020-05-26 LAB — BASIC METABOLIC PANEL
Anion gap: 14 (ref 5–15)
BUN: 23 mg/dL — ABNORMAL HIGH (ref 6–20)
CO2: 23 mmol/L (ref 22–32)
Calcium: 9.8 mg/dL (ref 8.9–10.3)
Chloride: 102 mmol/L (ref 98–111)
Creatinine, Ser: 1.22 mg/dL (ref 0.61–1.24)
GFR calc Af Amer: >60 mL/min (ref >60)
GFR calc non Af Amer: >60 mL/min (ref >60)
Glucose, Bld: 148 mg/dL — ABNORMAL HIGH (ref 70–99)
Potassium: 4.4 mmol/L (ref 3.5–5.1)
Sodium: 139 mmol/L (ref 135–145)

## 2020-05-26 LAB — LIPID PANEL
Cholesterol: 189 mg/dL (ref 0–200)
HDL: 40 mg/dL — ABNORMAL LOW (ref >40)
LDL Cholesterol: 127 mg/dL — ABNORMAL HIGH (ref 0–99)
Total CHOL/HDL Ratio: 4.7 RATIO
Triglycerides: 108 mg/dL (ref <150)
VLDL: 22 mg/dL (ref 0–40)

## 2020-05-26 LAB — DIGOXIN LEVEL: Digoxin Level: <0.2 ng/mL — ABNORMAL LOW (ref 0.8–2.0)

## 2020-05-26 NOTE — Patient Instructions (Signed)
NO medication changes  Labs today and repeat in 3 months  We will only contact you if something comes back abnormal or we need to make some changes. Otherwise no news is good news!  You have been ordered for an ultrasound of your legs.  You will get a call to schedule your appointment.   Your physician has requested that you have an echocardiogram. Echocardiography is a painless test that uses sound waves to create images of your heart. It provides your doctor with information about the size and shape of your heart and how well your heart's chambers and valves are working. This procedure takes approximately one hour. There are no restrictions for this procedure.  Your physician recommends that you schedule a follow-up appointment in: 6 months with Dr Shirlee Latch. You will get a call to schedule this appointment closer to that time.   Please call office at [phone] option 2 if you have any questions or concerns.   At the [facility], you and your health needs are our priority. As part of our continuing mission to provide you with exceptional heart care, we have created designated Provider Care Teams. These Care Teams include your primary Cardiologist (physician) and Advanced Practice Providers (APPs- Physician Assistants and Nurse Practitioners) who all work together to provide you with the care you need, when you need it.   You may see any of the following providers on your designated Care Team at your next follow up: [name] Dr Arvilla Meres . Dr Marca Ancona . Tonye Becket, NP . Robbie Lis, PA . Karle Plumber, PharmD   Please be sure to bring in all your medications bottles to every appointment.

## 2020-05-26 NOTE — Progress Notes (Signed)
CSW consulted to meet with pt regarding current lack of insurance.  Pt eligible for insurance through his work but reports it would be $200/month which is not affordable for him at this time.  States he has also previously looked into [employer] but it is also too expensive.  Pt has been evaluated for Medicaid in the past but is over reserve (owns multiple cars and a beach house).  Pt was previously on the [employer] and is interested in reapplying- application provided.  CSW also discussed [employer] to help with cone bills and provided with application.  Pt will plan to fill out both applications and return to CSW once completed to be submitted for review.  CSW will continue to follow and assist as needed  Burna Sis, LCSW Clinical Social Worker [facility] Desk#: [phone] Cell#: [phone]

## 2020-05-26 NOTE — Progress Notes (Signed)
Advanced Heart Failure Clinic Note  PCP: Dr. Jarold Song Cardiology: Dr. Percival Spanish HF Cardiology: Dr. Loretta Plume Frayne is a 49 y.o. male with history of CAD and ischemic cardiomyopathy.  He initially had NSTEMI in 10/12 with DES to proximal LAD. At that time, EF was noted to be down to 15%.  Over time, EF improved to 50% (3/13 echo).    Echo in 10/17 showed EF back down to 10% with severe functional MR.  He was taken for cardiac cath.  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  He had DES x 2 to LCx/OM2 and had a staged procedure with DES to distal RCA/ostial PDA.  RHC showed elevated filling pressure and low cardiac output.  While in the hospital, he was on milrinone gtt and was diuresed.  Eventually, we were able to titrate him off milrinone and discharged him home on a po regimen.   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   Echo in 8/20 showed EF in the 35% range with trivial MR.    Patient returns for followup of CHF and CAD.  He has been doing well overall except for foot ulcers attributed to diabetes.  He has been going to wound care for this. No significant exertional dyspnea or chest pain.  No lightheadedness.  No claudication.   +Snoring and daytime sleepiness.    ECG (personally reviewed): NSR, old inferior MI, old anterolateral MI  Labs (7/19): K 4.1, creatinine 1.5 Labs (9/20): LDL 114 Labs (1/21): K 4, creatinine 1.3  Review of systems complete and found to be negative unless listed in HPI.   PMH:  1. CAD: NSTEMI 10/12 with DES to proximal LAD.  - LHC (10/25/16): subtotal occlusion LCx/OM2, 80% dRCA/ostial PCA => DES x 2 to LCx/OM2.  On 10/30, staged procedure with DES to dRCA/ostial PDA.   2. Chronic systolic CHF: Ischemic cardiomyopathy.   - 11/12 echo with EF 15%. - Echo (3/13) with EF 50% - Echo (10/16) with EF 35-40% - Echo (10/17) with EF 10%, severe MR (appears functional).  - RHC (10/17)  with mean RA 20, PA 73/36, mean PCWP 28, CI 1.5 Fick/1.74 thermo - Echo (8/20) with EF 35% by my review, trivial MR.  3. Type II diabetes 4. HTN 5. Hyperlipidemia 6. Obesity 7. OSA: Suspected.   Social History   Socioeconomic History  . Marital status: Single    Spouse name: Not on file  . Number of children: Not on file  . Years of education: Not on file  . Highest education level: Not on file  Occupational History  . Not on file  Tobacco Use  . Smoking status: Never Smoker  . Smokeless tobacco: Never Used  Substance and Sexual Activity  . Alcohol use: Yes    Alcohol/week: 1.0 standard drinks    Types: 1 Shots of liquor per week    Comment: OCCASIONAL  . Drug use: No  . Sexual activity: Yes  Other Topics Concern  . Not on file  Social History Narrative  . Not on file   Social Determinants of Health   Financial Resource Strain: Low Risk   . Difficulty of Paying Living Expenses: Not very hard  Food Insecurity: No Food Insecurity  . Worried About Charity fundraiser in the Last Year: Never true  . Ran Out of Food in the Last Year: Never true  Transportation Needs: No Transportation Needs  . Lack  of Transportation (Medical): No  . Lack of Transportation (Non-Medical): No  Physical Activity:   . Days of Exercise per Week:   . Minutes of Exercise per Session:   Stress:   . Feeling of Stress :   Social Connections:   . Frequency of Communication with Friends and Family:   . Frequency of Social Gatherings with Friends and Family:   . Attends Religious Services:   . Active Member of Clubs or Organizations:   . Attends Banker Meetings:   [name] Marital Status:   Intimate Partner Violence:   . Fear of Current or Ex-Partner:   . Emotionally Abused:   [name] Physically Abused:   . Sexually Abused:    Family History  Problem Relation Age of Onset  . Heart disease Mother        MOTHER HAD CABG     Current Outpatient Medications  Medication Sig Dispense Refill   . aspirin 81 MG chewable tablet Chew 1 tablet (81 mg total) by mouth daily. 30 tablet 6  . atorvastatin (LIPITOR) 80 MG tablet Take 1 tablet (80 mg total) by mouth daily. 30 tablet 3  . Blood Glucose Monitoring Suppl (TRUE METRIX METER) DEVI 1 each by Does not apply route 3 (three) times daily. 1 Device 0  . carvedilol (COREG) 6.25 MG tablet Take 1 tablet (6.25 mg total) by mouth 2 (two) times daily with a meal. 60 tablet 3  . cephALEXin (KEFLEX) 500 MG capsule Take 1 capsule (500 mg total) by mouth 2 (two) times daily. 14 capsule 0  . cetirizine (ZYRTEC) 10 MG tablet Take 1 tablet (10 mg total) by mouth daily. 30 tablet 1  . dapagliflozin propanediol (FARXIGA) 10 MG TABS tablet Take 10 mg by mouth daily before breakfast. 30 tablet 6  . digoxin (LANOXIN) 0.125 MG tablet TAKE 1 TABLET (125 MCG TOTAL) BY MOUTH DAILY. 30 tablet 2  . furosemide (LASIX) 40 MG tablet Take 1 tablet (40 mg total) by mouth daily. 30 tablet 3  . glipiZIDE (GLUCOTROL) 10 MG tablet TAKE 1 TABLET BY MOUTH TWICE DAILY BEFORE MEAL(S). 60 tablet 6  . glucose blood (TRUE METRIX BLOOD GLUCOSE TEST) test strip 1 each by Other route 3 (three) times daily. 100 each 12  . Insulin Glargine (LANTUS SOLOSTAR) 100 UNIT/ML Solostar Pen Inject 20 Units into the skin daily. 5 pen 6  . Insulin Pen Needle (TRUEPLUS PEN NEEDLES) 31G X 5 MM MISC USE AS DIRECTED AT BEDTIME. 100 each 6  . omeprazole (PRILOSEC) 20 MG capsule Take 1 capsule (20 mg total) by mouth daily. 30 capsule 3  . sacubitril-valsartan (ENTRESTO) 97-103 MG Take 1 tablet by mouth 2 (two) times daily. 180 tablet 3  . sildenafil (REVATIO) 20 MG tablet Take 1 tablet (20 mg total) by mouth as needed (for erectile dysfunction). 10 tablet 3  . spironolactone (ALDACTONE) 50 MG tablet Take 1 tablet (50 mg total) by mouth daily. 30 tablet 2  . TRUEplus Lancets 28G MISC 1 each by Does not apply route 3 (three) times daily. 100 each 11   No current facility-administered medications for  this encounter.   BP 92/68   Pulse 86   Ht 6\' 6"  (1.981 m)   Wt 126.4 kg (278 lb 9.6 oz)   SpO2 98%   BMI 32.20 kg/m  Wt Readings from Last 3 Encounters:  05/26/20 126.4 kg (278 lb 9.6 oz)  02/21/20 126.1 kg (278 lb)  01/25/20 126.1 kg (278 lb)  General: NAD Neck: No JVD, no thyromegaly or thyroid nodule.  Lungs: Clear to auscultation bilaterally with normal respiratory effort. CV: Nondisplaced PMI.  Heart regular S1/S2, no S3/S4, no murmur.  No peripheral edema.  No carotid bruit.  Difficult to palpate pedal pulses.  Abdomen: Soft, nontender, no hepatosplenomegaly, no distention.  Skin: Intact without lesions or rashes.  Neurologic: Alert and oriented x 3.  Psych: Normal affect. Extremities: No clubbing or cyanosis.  HEENT: Normal.   Assessment/Plan: 1. CAD: Cath in 10/17 after EF found to have significantly dropped.  He had DES x 2 to the LCx system and DES x 1 to RCA.  No chest pain.  - Continue ASA 81 daily.   - Continue atorvastatin 40 mg daily, check lipids/LFTs today.  2. Chronic systolic CHF: EF 54% on 10/17 echo, down from 35-40% in 10/16.  Ischemic cardiomyopathy.  Now s/p PCI as above.  He had low output HF with volume overload on RHC done in 10/17.  He required milrinone and diuresis while in the hospital.  We were able to titrate off milrinone.  Last echo in 8/20 with EF 35%. Currently much improved, NYHA class II symptoms with no significant volume overload.   - Continue Lasix 40 mg daily. BMET today.  - Continue spironolactone 50 mg daily.  - Continue Coreg 6.25 mg bid, he has not tolerated increase.  - Continue Entresto 97/103 bid.  - Continue dapagliflozin 10 mg daily.    - Continue digoxin, check level today.  - EF borderline for ICD on last echo.  I will arrange for repeat echo.   3. Suspect OSA: When he gets insurance, will need sleep study.   4. Foot ulcers: Thought to be related to diabetes.  Difficult to palpate pedal pulses.  - I will arrange for  peripheral arterial dopplers.   Marca Ancona 05/26/2020

## 2020-05-30 ENCOUNTER — Telehealth (HOSPITAL_COMMUNITY): Payer: Self-pay

## 2020-05-30 DIAGNOSIS — E7800 Pure hypercholesterolemia, unspecified: Secondary | ICD-10-CM

## 2020-05-30 NOTE — Telephone Encounter (Signed)
PTaware of results. Referral placed to lipid clinic. Pt aware to look out for call from clinic. Voiced understanding.

## 2020-05-30 NOTE — Telephone Encounter (Signed)
-----   Message from Laurey Morale, MD sent at 05/26/2020  2:10 PM EDT ----- LDL too high.  He is already on atorvastatin 80 mg daily.  Do not think Zetia will get him to goal (LDL < 70).  Would refer to lipid clinic for Repatha.

## 2020-06-02 ENCOUNTER — Other Ambulatory Visit: Payer: Self-pay

## 2020-06-02 ENCOUNTER — Ambulatory Visit (INDEPENDENT_AMBULATORY_CARE_PROVIDER_SITE_OTHER): Payer: Self-pay | Admitting: Podiatry

## 2020-06-02 DIAGNOSIS — L97421 Non-pressure chronic ulcer of left heel and midfoot limited to breakdown of skin: Secondary | ICD-10-CM

## 2020-06-02 DIAGNOSIS — E08621 Diabetes mellitus due to underlying condition with foot ulcer: Secondary | ICD-10-CM

## 2020-06-12 MED FILL — ATORVASTATIN 80 MG TABLET: 80 | 30 days supply | Qty: 30 | Fill #2

## 2020-06-12 MED FILL — $LANTUS SOLOSTAR 100 UNITS/: 100 | 30 days supply | Qty: 6 | Fill #2

## 2020-06-12 MED FILL — FUROSEMIDE 40 MG TAB: 40 | 30 days supply | Qty: 30 | Fill #2

## 2020-06-16 ENCOUNTER — Ambulatory Visit (INDEPENDENT_AMBULATORY_CARE_PROVIDER_SITE_OTHER): Payer: Self-pay | Admitting: Podiatry

## 2020-06-16 ENCOUNTER — Other Ambulatory Visit: Payer: Self-pay

## 2020-06-16 DIAGNOSIS — E08621 Diabetes mellitus due to underlying condition with foot ulcer: Secondary | ICD-10-CM

## 2020-06-16 DIAGNOSIS — L97421 Non-pressure chronic ulcer of left heel and midfoot limited to breakdown of skin: Secondary | ICD-10-CM

## 2020-06-21 ENCOUNTER — Telehealth: Payer: Self-pay | Admitting: Podiatry

## 2020-06-21 ENCOUNTER — Ambulatory Visit (INDEPENDENT_AMBULATORY_CARE_PROVIDER_SITE_OTHER): Payer: Self-pay | Admitting: Podiatry

## 2020-06-21 ENCOUNTER — Encounter: Payer: Self-pay | Admitting: Podiatry

## 2020-06-21 ENCOUNTER — Other Ambulatory Visit: Payer: Self-pay

## 2020-06-21 ENCOUNTER — Telehealth (HOSPITAL_COMMUNITY): Payer: Self-pay

## 2020-06-21 DIAGNOSIS — E11628 Type 2 diabetes mellitus with other skin complications: Secondary | ICD-10-CM

## 2020-06-21 DIAGNOSIS — L97421 Non-pressure chronic ulcer of left heel and midfoot limited to breakdown of skin: Secondary | ICD-10-CM

## 2020-06-21 DIAGNOSIS — L97511 Non-pressure chronic ulcer of other part of right foot limited to breakdown of skin: Secondary | ICD-10-CM

## 2020-06-21 DIAGNOSIS — L539 Erythematous condition, unspecified: Secondary | ICD-10-CM

## 2020-06-21 DIAGNOSIS — L089 Local infection of the skin and subcutaneous tissue, unspecified: Secondary | ICD-10-CM

## 2020-06-21 DIAGNOSIS — E08621 Diabetes mellitus due to underlying condition with foot ulcer: Secondary | ICD-10-CM

## 2020-06-21 DIAGNOSIS — L97519 Non-pressure chronic ulcer of other part of right foot with unspecified severity: Secondary | ICD-10-CM

## 2020-06-21 MED ORDER — OXYCODONE-ACETAMINOPHEN 10-325 MG PO TABS: 1.0000 | ORAL_TABLET | Freq: Three times a day (TID) | ORAL | 0 refills | Status: DC | PRN

## 2020-06-21 MED ORDER — DOXYCYCLINE HYCLATE 100 MG PO TABS
100.0000 mg | ORAL_TABLET | Freq: Two times a day (BID) | ORAL | 0 refills | Status: DC
Start: 2020-06-21 — End: 2020-06-27

## 2020-06-21 MED FILL — DIGOXIN 0.125 MG TABLET: 125 | 30 days supply | Qty: 30 | Fill #0

## 2020-06-21 MED FILL — OMEPRAZOLE DR 20 MG CAPSULE: 20 | 30 days supply | Qty: 30 | Fill #3

## 2020-06-21 MED FILL — CARVEDILOL 6.25 MG TABLET: 6.25 | 30 days supply | Qty: 60 | Fill #3

## 2020-06-21 MED FILL — FUROSEMIDE 40 MG TAB: 40 | 30 days supply | Qty: 30 | Fill #2

## 2020-06-21 MED FILL — DOXYCYCLINE HYCLATE 100 MG: 100 | 14 days supply | Qty: 28 | Fill #0

## 2020-06-21 MED FILL — SPIRONOLACTONE 50 MG TABLET: 50 | 30 days supply | Qty: 30 | Fill #1

## 2020-06-21 NOTE — Telephone Encounter (Signed)
Order entered

## 2020-06-21 NOTE — Telephone Encounter (Signed)
Pt called and wanted to know if he can something for pain please assist

## 2020-06-21 NOTE — Progress Notes (Signed)
Subjective:  Patient ID: [name], male    DOB: 04/07/1971,  MRN: [id number]  Chief Complaint  Patient presents with  . Foot Pain    pt is here for a possible blister of the left foot, pt states that the blister is elevated to the touch.    49 y.o. male presents for wound care.  Patient presents with left submetatarsal wound that was being treated by Dr. Samuella Cota.  Patient said there is redness associated with the wound that started out of nowhere.  He also has another wound developing on the right submetatarsal 5 as well.  Patient states that this also started acutely.  He has been ambulating with diabetic shoes on the right side and with surgical shoe on the left side.  He has not taken antibiotics he denies any other acute complaints.  He would like to discuss local wound care and treatment options given that the wound has regressed especially on the left side with associated redness.   Review of Systems: Negative except as noted in the HPI. Denies N/V/F/Ch.  Past Medical History:  Diagnosis Date  . CAD (coronary artery disease)    NSTEMI 10/2011: LHC 11/04/11: pLAD 90%, mLAD 60-70%, small D2 sub totally occluded at ostium, small OM1 90% ostial, 90% mid, mOM2 30%, oPL 80%, RCA 30%, dPDA 80%, EF 20% with ant AK.  PCI:  Promus DES to pLAD.  [name] Chronic systolic heart failure (HCC)   . DM2 (diabetes mellitus, type 2) (HCC)   . HTN (hypertension)   . Hyperlipidemia   . Ischemic cardiomyopathy    Echo 11/03/11: mod LVH, mild focal basal septal hypertrophy, EF 15%, grade 2 diast dysfxn, mild MR, mild to mod LAE, mild RVE, mild to mod reduced RVSF.  EF 3/5 50% by echo  . Obesity     Current Outpatient Medications:  .  aspirin 81 MG chewable tablet, Chew 1 tablet (81 mg total) by mouth daily., Disp: 30 tablet, Rfl: 6 .  atorvastatin (LIPITOR) 80 MG tablet, Take 1 tablet (80 mg total) by mouth daily., Disp: 30 tablet, Rfl: 3 .  Blood Glucose Monitoring Suppl (TRUE METRIX METER) DEVI, 1 each  by Does not apply route 3 (three) times daily., Disp: 1 Device, Rfl: 0 .  carvedilol (COREG) 6.25 MG tablet, Take 1 tablet (6.25 mg total) by mouth 2 (two) times daily with a meal., Disp: 60 tablet, Rfl: 3 .  cephALEXin (KEFLEX) 500 MG capsule, Take 1 capsule (500 mg total) by mouth 2 (two) times daily., Disp: 14 capsule, Rfl: 0 .  cetirizine (ZYRTEC) 10 MG tablet, Take 1 tablet (10 mg total) by mouth daily., Disp: 30 tablet, Rfl: 1 .  dapagliflozin propanediol (FARXIGA) 10 MG TABS tablet, Take 10 mg by mouth daily before breakfast., Disp: 30 tablet, Rfl: 6 .  digoxin (LANOXIN) 0.125 MG tablet, TAKE 1 TABLET (125 MCG TOTAL) BY MOUTH DAILY., Disp: 30 tablet, Rfl: 2 .  doxycycline (VIBRA-TABS) 100 MG tablet, Take 1 tablet (100 mg total) by mouth 2 (two) times daily., Disp: 28 tablet, Rfl: 0 .  furosemide (LASIX) 40 MG tablet, Take 1 tablet (40 mg total) by mouth daily., Disp: 30 tablet, Rfl: 3 .  glipiZIDE (GLUCOTROL) 10 MG tablet, TAKE 1 TABLET BY MOUTH TWICE DAILY BEFORE MEAL(S)., Disp: 60 tablet, Rfl: 6 .  glucose blood (TRUE METRIX BLOOD GLUCOSE TEST) test strip, 1 each by Other route 3 (three) times daily., Disp: 100 each, Rfl: 12 .  Insulin Glargine (LANTUS SOLOSTAR) 100  UNIT/ML Solostar Pen, Inject 20 Units into the skin daily., Disp: 5 pen, Rfl: 6 .  Insulin Pen Needle (TRUEPLUS PEN NEEDLES) 31G X 5 MM MISC, USE AS DIRECTED AT BEDTIME., Disp: 100 each, Rfl: 6 .  omeprazole (PRILOSEC) 20 MG capsule, Take 1 capsule (20 mg total) by mouth daily., Disp: 30 capsule, Rfl: 3 .  sacubitril-valsartan (ENTRESTO) 97-103 MG, Take 1 tablet by mouth 2 (two) times daily., Disp: 180 tablet, Rfl: 3 .  sildenafil (REVATIO) 20 MG tablet, Take 1 tablet (20 mg total) by mouth as needed (for erectile dysfunction)., Disp: 10 tablet, Rfl: 3 .  spironolactone (ALDACTONE) 50 MG tablet, Take 1 tablet (50 mg total) by mouth daily., Disp: 30 tablet, Rfl: 2 .  TRUEplus Lancets 28G MISC, 1 each by Does not apply route 3  (three) times daily., Disp: 100 each, Rfl: 11  Social History   Tobacco Use  Smoking Status Never Smoker  Smokeless Tobacco Never Used    No Known Allergies Objective:  There were no vitals filed for this visit. There is no height or weight on file to calculate BMI. Constitutional Well developed. Well nourished.  Vascular Dorsalis pedis pulses palpable bilaterally. Posterior tibial pulses palpable bilaterally. Capillary refill normal to all digits.  No cyanosis or clubbing noted. Pedal hair growth normal.  Neurologic Normal speech. Oriented to person, place, and time. Protective sensation absent  Dermatologic Wound Location: Bilateral submetatarsal 5 wound with fat layer exposed with left severe than right.  The left side there is cellulitis present.  No malodor present.  No purulent drainage was expressed.  The right submet 5 with fat layer exposed does not probe down to to bone.  No purulent drainage was expressed.  No malodor present. Wound Base: Mixed Granular/Fibrotic Peri-wound: Calloused Exudate: Scant/small amount Serosanguinous exudate Wound Measurements: -See below  Orthopedic: No pain to palpation either foot.   Radiographs: None Assessment:  No diagnosis found. Plan:  Patient was evaluated and treated and all questions answered.  Ulcer submetatarsal 5 with fat layer exposed bilateral left severe than right -Debridement as below. -Dressed with Betadine wet-to-dry, DSD. -Continue off-loading with surgical shoe. -Surgical shoe was dispensed to the right side as well. -Patient is a high risk of losing the digit especially to the left side given that there is redness surrounding it with concern for infection.  No bone exposure noted.  I am hopeful that this is just skin and soft tissue infection.  Will plan on giving doxycycline for 14-day course. -I have asked the patient to go to the emergency room right away or call the office if the redness worsens or goes past  the dorsal midfoot.  Patient states understanding. -  Procedure: Excisional Debridement of Wound left submetatarsal 5 Tool: Sharp chisel blade/tissue nipper Rationale: Removal of non-viable soft tissue from the wound to promote healing.  Anesthesia: none Pre-Debridement Wound Measurements: 1.8 cm x 1.80 cm x 0.3 cm  Post-Debridement Wound Measurements: 1.9 cm x 1.9 cm x 0.3 cm  Type of Debridement: Sharp Excisional Tissue Removed: Non-viable soft tissue Blood loss: Minimal (<50cc) Depth of Debridement: subcutaneous tissue. Technique: Sharp excisional debridement to bleeding, viable wound base.  Wound Progress: It appears that the wound measurements are decreasing steadily. Site healing conversation 7 Dressing: Dry, sterile, compression dressing. Disposition: Patient tolerated procedure well. Patient to return in 1 week for follow-up  Excisional Debridement of Wound right submetatarsal 5 Tool: Sharp chisel blade/tissue nipper Rationale: Removal of non-viable soft tissue from the wound  to promote healing.  Anesthesia: none Pre-Debridement Wound Measurements: 0.3 x 0.3 x 0.1 cm Post-Debridement Wound Measurements: 0.4 cm x 0.4 cm x 0.1 cm Type of Debridement: Sharp Excisional Tissue Removed: Non-viable soft tissue Blood loss: Minimal (<50cc) Depth of Debridement: subcutaneous tissue. Technique: Sharp excisional debridement to bleeding, viable wound base.  Wound Progress: This is an initial evaluation for the right submetatarsal 5 wound.  We will continue to monitor Site healing conversation 7 Dressing: Dry, sterile, compression dressing. Disposition: Patient tolerated procedure well. Patient to return in 1 week for follow-up  No follow-ups on file.

## 2020-06-21 NOTE — Telephone Encounter (Signed)
Done

## 2020-06-21 NOTE — Telephone Encounter (Signed)
-----   Message from Wille Glaser sent at 06/21/2020  1:55 PM EDT ----- Regarding: ABI order Pt is schedule 06/23/20, Vascular states he needs ABI order Thanks

## 2020-06-21 NOTE — Addendum Note (Signed)
Addended by: Nicholes Rough on: 06/21/2020 08:37 PM   Modules accepted: Orders

## 2020-06-22 ENCOUNTER — Other Ambulatory Visit: Payer: Self-pay

## 2020-06-22 ENCOUNTER — Telehealth: Payer: Self-pay | Admitting: Podiatry

## 2020-06-22 ENCOUNTER — Ambulatory Visit: Payer: Self-pay | Admitting: Podiatry

## 2020-06-22 ENCOUNTER — Inpatient Hospital Stay (HOSPITAL_COMMUNITY)
Admission: RE | Admit: 2020-06-22 | Discharge: 2020-06-27 | DRG: 617 | Disposition: A | Discharge status: Home / Self Care | Payer: Self-pay | Attending: Internal Medicine | Admitting: Internal Medicine

## 2020-06-22 ENCOUNTER — Encounter (HOSPITAL_COMMUNITY): Payer: Self-pay

## 2020-06-22 ENCOUNTER — Emergency Department (HOSPITAL_COMMUNITY): Payer: Self-pay

## 2020-06-22 DIAGNOSIS — E785 Hyperlipidemia, unspecified: Secondary | ICD-10-CM | POA: Diagnosis present

## 2020-06-22 DIAGNOSIS — E782 Mixed hyperlipidemia: Secondary | ICD-10-CM

## 2020-06-22 DIAGNOSIS — Z794 Long term (current) use of insulin: Secondary | ICD-10-CM

## 2020-06-22 DIAGNOSIS — I2510 Atherosclerotic heart disease of native coronary artery without angina pectoris: Secondary | ICD-10-CM | POA: Diagnosis present

## 2020-06-22 DIAGNOSIS — I10 Essential (primary) hypertension: Secondary | ICD-10-CM

## 2020-06-22 DIAGNOSIS — I5042 Chronic combined systolic (congestive) and diastolic (congestive) heart failure: Secondary | ICD-10-CM

## 2020-06-22 DIAGNOSIS — Z79899 Other long term (current) drug therapy: Secondary | ICD-10-CM

## 2020-06-22 DIAGNOSIS — M869 Osteomyelitis, unspecified: Secondary | ICD-10-CM | POA: Diagnosis present

## 2020-06-22 DIAGNOSIS — I252 Old myocardial infarction: Secondary | ICD-10-CM

## 2020-06-22 DIAGNOSIS — E1165 Type 2 diabetes mellitus with hyperglycemia: Secondary | ICD-10-CM

## 2020-06-22 DIAGNOSIS — L03116 Cellulitis of left lower limb: Secondary | ICD-10-CM

## 2020-06-22 DIAGNOSIS — L97529 Non-pressure chronic ulcer of other part of left foot with unspecified severity: Secondary | ICD-10-CM | POA: Diagnosis present

## 2020-06-22 DIAGNOSIS — Z7982 Long term (current) use of aspirin: Secondary | ICD-10-CM

## 2020-06-22 DIAGNOSIS — E11621 Type 2 diabetes mellitus with foot ulcer: Secondary | ICD-10-CM | POA: Diagnosis present

## 2020-06-22 DIAGNOSIS — Z955 Presence of coronary angioplasty implant and graft: Secondary | ICD-10-CM

## 2020-06-22 DIAGNOSIS — L97519 Non-pressure chronic ulcer of other part of right foot with unspecified severity: Secondary | ICD-10-CM | POA: Diagnosis present

## 2020-06-22 DIAGNOSIS — E1169 Type 2 diabetes mellitus with other specified complication: Principal | ICD-10-CM

## 2020-06-22 DIAGNOSIS — Z8249 Family history of ischemic heart disease and other diseases of the circulatory system: Secondary | ICD-10-CM

## 2020-06-22 DIAGNOSIS — E1122 Type 2 diabetes mellitus with diabetic chronic kidney disease: Secondary | ICD-10-CM | POA: Diagnosis present

## 2020-06-22 DIAGNOSIS — M86172 Other acute osteomyelitis, left ankle and foot: Secondary | ICD-10-CM

## 2020-06-22 DIAGNOSIS — I130 Hypertensive heart and chronic kidney disease with heart failure and stage 1 through stage 4 chronic kidney disease, or unspecified chronic kidney disease: Secondary | ICD-10-CM | POA: Diagnosis present

## 2020-06-22 DIAGNOSIS — N183 Chronic kidney disease, stage 3 unspecified: Secondary | ICD-10-CM | POA: Diagnosis present

## 2020-06-22 DIAGNOSIS — Z20822 Contact with and (suspected) exposure to covid-19: Secondary | ICD-10-CM | POA: Diagnosis present

## 2020-06-22 DIAGNOSIS — E119 Type 2 diabetes mellitus without complications: Secondary | ICD-10-CM

## 2020-06-22 DIAGNOSIS — Z09 Encounter for follow-up examination after completed treatment for conditions other than malignant neoplasm: Secondary | ICD-10-CM

## 2020-06-22 DIAGNOSIS — B9561 Methicillin susceptible Staphylococcus aureus infection as the cause of diseases classified elsewhere: Secondary | ICD-10-CM | POA: Diagnosis present

## 2020-06-22 DIAGNOSIS — I25119 Atherosclerotic heart disease of native coronary artery with unspecified angina pectoris: Secondary | ICD-10-CM

## 2020-06-22 HISTORY — DX: Osteomyelitis, unspecified: M86.9

## 2020-06-22 HISTORY — DX: Cellulitis of left lower limb: L03.116

## 2020-06-22 HISTORY — DX: Non-pressure chronic ulcer of other part of right foot with unspecified severity: L97.519

## 2020-06-22 LAB — COMPREHENSIVE METABOLIC PANEL
ALT: 13 U/L (ref 0–44)
AST: 11 U/L — ABNORMAL LOW (ref 15–41)
Albumin: 3.6 g/dL (ref 3.5–5.0)
Alkaline Phosphatase: 50 U/L (ref 38–126)
Anion gap: 13 (ref 5–15)
BUN: 29 mg/dL — ABNORMAL HIGH (ref 6–20)
CO2: 20 mmol/L — ABNORMAL LOW (ref 22–32)
Calcium: 9.2 mg/dL (ref 8.9–10.3)
Chloride: 102 mmol/L (ref 98–111)
Creatinine, Ser: 1.28 mg/dL — ABNORMAL HIGH (ref 0.61–1.24)
GFR calc Af Amer: >60 mL/min (ref >60)
GFR calc non Af Amer: >60 mL/min (ref >60)
Glucose, Bld: 108 mg/dL — ABNORMAL HIGH (ref 70–99)
Potassium: 4 mmol/L (ref 3.5–5.1)
Sodium: 135 mmol/L (ref 135–145)
Total Bilirubin: 1.1 mg/dL (ref 0.3–1.2)
Total Protein: 7.5 g/dL (ref 6.5–8.1)

## 2020-06-22 LAB — CBC WITH DIFFERENTIAL/PLATELET
Abs Immature Granulocytes: 0.06 10*3/uL (ref 0.00–0.07)
Basophils Absolute: 0.1 10*3/uL (ref 0.0–0.1)
Basophils Relative: 1 %
Eosinophils Absolute: 0.2 10*3/uL (ref 0.0–0.5)
Eosinophils Relative: 1 %
HCT: 42.6 % (ref 39.0–52.0)
Hemoglobin: 13.5 g/dL (ref 13.0–17.0)
Immature Granulocytes: 0 %
Lymphocytes Relative: 18 %
Lymphs Abs: 2.6 10*3/uL (ref 0.7–4.0)
MCH: 26.3 pg (ref 26.0–34.0)
MCHC: 31.7 g/dL (ref 30.0–36.0)
MCV: 82.9 fL (ref 80.0–100.0)
Monocytes Absolute: 1.2 10*3/uL — ABNORMAL HIGH (ref 0.1–1.0)
Monocytes Relative: 8 %
Neutro Abs: 10.4 10*3/uL — ABNORMAL HIGH (ref 1.7–7.7)
Neutrophils Relative %: 72 %
Platelets: 279 10*3/uL (ref 150–400)
RBC: 5.14 MIL/uL (ref 4.22–5.81)
RDW: 13.3 % (ref 11.5–15.5)
WBC: 14.6 10*3/uL — ABNORMAL HIGH (ref 4.0–10.5)
nRBC: 0 % (ref 0.0–0.2)

## 2020-06-22 LAB — CBG MONITORING, ED
Glucose-Capillary: 92 mg/dL (ref 70–99)
Glucose-Capillary: 133 mg/dL — ABNORMAL HIGH (ref 70–99)

## 2020-06-22 LAB — HEMOGLOBIN A1C
Hgb A1c MFr Bld: 9.3 % — ABNORMAL HIGH (ref 4.8–5.6)
Mean Plasma Glucose: 220.21 mg/dL

## 2020-06-22 LAB — LACTIC ACID, PLASMA: Lactic Acid, Venous: 0.9 mmol/L (ref 0.5–1.9)

## 2020-06-22 LAB — SARS CORONAVIRUS 2 BY RT PCR (HOSPITAL ORDER, PERFORMED IN ~~LOC~~ HOSPITAL LAB): SARS Coronavirus 2: NEGATIVE

## 2020-06-22 MED ORDER — ONDANSETRON HCL 4 MG/2ML IJ SOLN: 4.0000 mg | Freq: Four times a day (QID) | INTRAMUSCULAR | Status: DC | PRN

## 2020-06-22 MED ORDER — OXYCODONE-ACETAMINOPHEN 5-325 MG PO TABS
2.0000 | ORAL_TABLET | Freq: Once | ORAL | Status: AC
  Administered 2020-06-22: 2 via ORAL
  Filled 2020-06-22: qty 2

## 2020-06-22 MED ORDER — CARVEDILOL 6.25 MG PO TABS
6.2500 mg | ORAL_TABLET | Freq: Two times a day (BID) | ORAL | Status: DC
  Administered 2020-06-23 – 2020-06-27 (×9): 6.25 mg via ORAL
  Filled 2020-06-22 (×9): qty 1

## 2020-06-22 MED ORDER — GADOBUTROL 1 MMOL/ML IV SOLN
10.0000 mL | Freq: Once | INTRAVENOUS | Status: AC | PRN
  Administered 2020-06-22: 10 mL via INTRAVENOUS

## 2020-06-22 MED ORDER — INSULIN ASPART 100 UNIT/ML ~~LOC~~ SOLN
0.0000 [IU] | Freq: Three times a day (TID) | SUBCUTANEOUS | Status: DC
  Administered 2020-06-22: 2 [IU] via SUBCUTANEOUS
  Administered 2020-06-23: 3 [IU] via SUBCUTANEOUS
  Administered 2020-06-23: 2 [IU] via SUBCUTANEOUS
  Administered 2020-06-23: 5 [IU] via SUBCUTANEOUS
  Administered 2020-06-24 (×2): 3 [IU] via SUBCUTANEOUS
  Administered 2020-06-24: 2 [IU] via SUBCUTANEOUS
  Administered 2020-06-24: 5 [IU] via SUBCUTANEOUS
  Administered 2020-06-25: 2 [IU] via SUBCUTANEOUS
  Administered 2020-06-25: 5 [IU] via SUBCUTANEOUS
  Administered 2020-06-25: 8 [IU] via SUBCUTANEOUS
  Administered 2020-06-25 – 2020-06-26 (×2): 3 [IU] via SUBCUTANEOUS
  Administered 2020-06-26: 5 [IU] via SUBCUTANEOUS
  Administered 2020-06-26 – 2020-06-27 (×2): 3 [IU] via SUBCUTANEOUS

## 2020-06-22 MED ORDER — LACTATED RINGERS IV SOLN: INTRAVENOUS | Status: AC

## 2020-06-22 MED ORDER — POLYETHYLENE GLYCOL 3350 17 G PO PACK: 17.0000 g | PACK | Freq: Every day | ORAL | Status: DC | PRN

## 2020-06-22 MED ORDER — PANTOPRAZOLE SODIUM 40 MG PO TBEC
40.0000 mg | DELAYED_RELEASE_TABLET | Freq: Every day | ORAL | Status: DC
  Administered 2020-06-24 – 2020-06-27 (×3): 40 mg via ORAL
  Filled 2020-06-22 (×3): qty 1

## 2020-06-22 MED ORDER — ACETAMINOPHEN 650 MG RE SUPP: 650.0000 mg | Freq: Four times a day (QID) | RECTAL | Status: DC | PRN

## 2020-06-22 MED ORDER — SPIRONOLACTONE 25 MG PO TABS: 50.0000 mg | ORAL_TABLET | Freq: Every day | ORAL | Status: DC

## 2020-06-22 MED ORDER — VANCOMYCIN HCL 2000 MG/400ML IV SOLN
2000.0000 mg | Freq: Once | INTRAVENOUS | Status: AC
  Administered 2020-06-22: 2000 mg via INTRAVENOUS
  Filled 2020-06-22: qty 400

## 2020-06-22 MED ORDER — ATORVASTATIN CALCIUM 80 MG PO TABS
80.0000 mg | ORAL_TABLET | Freq: Every evening | ORAL | Status: DC
  Administered 2020-06-22 – 2020-06-26 (×5): 80 mg via ORAL
  Filled 2020-06-22 (×5): qty 1

## 2020-06-22 MED ORDER — LORATADINE 10 MG PO TABS
10.0000 mg | ORAL_TABLET | Freq: Every day | ORAL | Status: DC
  Administered 2020-06-24 – 2020-06-27 (×3): 10 mg via ORAL
  Filled 2020-06-22 (×4): qty 1

## 2020-06-22 MED ORDER — OXYCODONE-ACETAMINOPHEN 10-325 MG PO TABS: 1.0000 | ORAL_TABLET | Freq: Three times a day (TID) | ORAL | 0 refills | Status: AC | PRN

## 2020-06-22 MED ORDER — SODIUM CHLORIDE 0.9 % IV SOLN
2.0000 g | INTRAVENOUS | Status: DC
  Administered 2020-06-22 – 2020-06-24 (×3): 2 g via INTRAVENOUS
  Filled 2020-06-22 (×3): qty 20

## 2020-06-22 MED ORDER — ASPIRIN 81 MG PO CHEW: 81.0000 mg | CHEWABLE_TABLET | Freq: Every day | ORAL | Status: DC

## 2020-06-22 MED ORDER — OXYCODONE-ACETAMINOPHEN 5-325 MG PO TABS
1.0000 | ORAL_TABLET | ORAL | Status: DC | PRN
  Administered 2020-06-23: 1 via ORAL
  Filled 2020-06-22: qty 1

## 2020-06-22 MED ORDER — VANCOMYCIN HCL IN DEXTROSE 1-5 GM/200ML-% IV SOLN
1000.0000 mg | Freq: Three times a day (TID) | INTRAVENOUS | Status: DC
  Administered 2020-06-23 – 2020-06-25 (×8): 1000 mg via INTRAVENOUS
  Filled 2020-06-22 (×11): qty 200

## 2020-06-22 MED ORDER — ONDANSETRON HCL 4 MG PO TABS: 4.0000 mg | ORAL_TABLET | Freq: Four times a day (QID) | ORAL | Status: DC | PRN

## 2020-06-22 MED ORDER — DIGOXIN 125 MCG PO TABS
0.1250 mg | ORAL_TABLET | Freq: Every day | ORAL | Status: DC
  Administered 2020-06-24 – 2020-06-27 (×3): 0.125 mg via ORAL
  Filled 2020-06-22 (×3): qty 1

## 2020-06-22 MED ORDER — FUROSEMIDE 40 MG PO TABS: 40.0000 mg | ORAL_TABLET | Freq: Every day | ORAL | Status: DC

## 2020-06-22 MED ORDER — ACETAMINOPHEN 325 MG PO TABS
650.0000 mg | ORAL_TABLET | Freq: Four times a day (QID) | ORAL | Status: DC | PRN
  Administered 2020-06-23 – 2020-06-25 (×3): 650 mg via ORAL
  Filled 2020-06-22 (×3): qty 2

## 2020-06-22 MED ORDER — SACUBITRIL-VALSARTAN 97-103 MG PO TABS
1.0000 | ORAL_TABLET | Freq: Two times a day (BID) | ORAL | Status: DC
  Administered 2020-06-22 – 2020-06-27 (×8): 1 via ORAL
  Filled 2020-06-22 (×10): qty 1

## 2020-06-22 MED ORDER — INSULIN GLARGINE 100 UNIT/ML ~~LOC~~ SOLN
20.0000 [IU] | Freq: Every evening | SUBCUTANEOUS | Status: DC
  Administered 2020-06-22 – 2020-06-26 (×5): 20 [IU] via SUBCUTANEOUS
  Filled 2020-06-22 (×6): qty 0.2

## 2020-06-22 MED ORDER — VANCOMYCIN HCL IN DEXTROSE 1-5 GM/200ML-% IV SOLN: 1000.0000 mg | Freq: Three times a day (TID) | INTRAVENOUS | Status: DC

## 2020-06-22 MED ORDER — MORPHINE SULFATE (PF) 4 MG/ML IV SOLN: 4.0000 mg | INTRAVENOUS | Status: DC | PRN

## 2020-06-22 MED ORDER — SODIUM CHLORIDE 0.9% FLUSH: 3.0000 mL | Freq: Once | INTRAVENOUS | Status: DC

## 2020-06-22 MED FILL — ATORVASTATIN 80 MG TABLET: 80 | 30 days supply | Qty: 30 | Fill #2

## 2020-06-22 MED FILL — OXYCODONE-APAP 10-325: 10-325 | 5 days supply | Qty: 15 | Fill #0

## 2020-06-22 NOTE — Consult Note (Signed)
Reason for Consult: Osteomyelitis Referring Physician: Dr. Lorre Nick, MD  [name] is an 49 y.o. male.  HPI: 49 year old male presents to the office yesterday to see Dr. Allena Katz for worsening infection to his left foot.  He previously had a blister on the left foot as well as a wound has been treated by Dr. Samuella Cota and Dr. Allena Katz.  He states that has been getting worse over the last couple of days.  He was directed yesterday from the emergency room.  X-rays were initially negative and white blood cell count was elevated to 14.6.  Lactic acid 0.9 and afebrile.  Initially spoke to the emergency department in regards to this and discussed discharge home however given the pictures I was concerned about osteomyelitis.  MRI was ordered and started on vancomycin.  MRI did confirm osteomyelitis.  Past Medical History:  Diagnosis Date  . CAD (coronary artery disease)    NSTEMI 10/2011: LHC 11/04/11: pLAD 90%, mLAD 60-70%, small D2 sub totally occluded at ostium, small OM1 90% ostial, 90% mid, mOM2 30%, oPL 80%, RCA 30%, dPDA 80%, EF 20% with ant AK.  PCI:  Promus DES to pLAD.  [name] Chronic systolic heart failure (HCC)   . DM2 (diabetes mellitus, type 2) (HCC)   . HTN (hypertension)   . Hyperlipidemia   . Ischemic cardiomyopathy    Echo 11/03/11: mod LVH, mild focal basal septal hypertrophy, EF 15%, grade 2 diast dysfxn, mild MR, mild to mod LAE, mild RVE, mild to mod reduced RVSF.  EF 3/5 50% by echo  . Obesity     Past Surgical History:  Procedure Laterality Date  . CARDIAC CATHETERIZATION N/A 10/25/2016   Procedure: Right/Left Heart Cath and Coronary Angiography;  Surgeon: Marykay Lex, MD;  Location: [facility] INVASIVE CV LAB;  Service: Cardiovascular;  Laterality: N/A;  . CARDIAC CATHETERIZATION N/A 10/25/2016   Procedure: Coronary Stent Intervention;  Surgeon: Marykay Lex, MD;  Location: [facility] INVASIVE CV LAB;  Service: Cardiovascular;  Laterality: N/A;  . CARDIAC CATHETERIZATION N/A 10/28/2016    Procedure: Coronary Stent Intervention;  Surgeon: Kathleene Hazel, MD;  Location: [facility] INVASIVE CV LAB;  Service: Cardiovascular;  Laterality: N/A;  . LEFT AND RIGHT HEART CATHETERIZATION WITH CORONARY ANGIOGRAM N/A 11/04/2011   Procedure: LEFT AND RIGHT HEART CATHETERIZATION WITH CORONARY ANGIOGRAM;  Surgeon: Rollene Rotunda, MD;  Location: [facility] CATH LAB;  Service: Cardiovascular;  Laterality: N/A;  . NONE    . PERCUTANEOUS CORONARY STENT INTERVENTION (PCI-S)  11/04/2011   Procedure: PERCUTANEOUS CORONARY STENT INTERVENTION (PCI-S);  Surgeon: Tonny Bollman, MD;  Location: [facility] CATH LAB;  Service: Cardiovascular;;    Family History  Problem Relation Age of Onset  . Heart disease Mother        MOTHER HAD CABG    Social History:  reports that he has never smoked. He has never used smokeless tobacco. He reports current alcohol use of about 1.0 standard drink of alcohol per week. He reports that he does not use drugs.  Allergies: No Known Allergies  Medications: I have reviewed the patient's current medications.  Results for orders placed or performed during the hospital encounter of 06/22/20 (from the past 48 hour(s))  CBG monitoring, ED     Status: None   Collection Time: 06/22/20  7:34 AM  Result Value Ref Range   Glucose-Capillary 92 70 - 99 mg/dL    Comment: Glucose reference range applies only to samples taken after fasting for at least 8 hours.   Comment 1  Notify RN    Comment 2 Document in Chart   Lactic acid, plasma     Status: None   Collection Time: 06/22/20  7:35 AM  Result Value Ref Range   Lactic Acid, Venous 0.9 0.5 - 1.9 mmol/L    Comment: Performed at [facility], [address] [place], [place] [address]  Comprehensive metabolic panel     Status: Abnormal   Collection Time: 06/22/20  7:35 AM  Result Value Ref Range   Sodium 135 135 - 145 mmol/L   Potassium 4.0 3.5 - 5.1 mmol/L   Chloride 102 98 - 111 mmol/L   CO2 20 (L) 22 - 32 mmol/L   Glucose, Bld 108  (H) 70 - 99 mg/dL    Comment: Glucose reference range applies only to samples taken after fasting for at least 8 hours.   BUN 29 (H) 6 - 20 mg/dL   Creatinine, Ser 1.28 (H) 0.61 - 1.24 mg/dL   Calcium 9.2 8.9 - 10.3 mg/dL   Total Protein 7.5 6.5 - 8.1 g/dL   Albumin 3.6 3.5 - 5.0 g/dL   AST 11 (L) 15 - 41 U/L   ALT 13 0 - 44 U/L   Alkaline Phosphatase 50 38 - 126 U/L   Total Bilirubin 1.1 0.3 - 1.2 mg/dL   GFR calc non Af Amer >60 >60 mL/min   GFR calc Af Amer >60 >60 mL/min   Anion gap 13 5 - 15    Comment: Performed at [facility], [place] [address] [place], [place] [address]  CBC with Differential     Status: Abnormal   Collection Time: 06/22/20  7:35 AM  Result Value Ref Range   WBC 14.6 (H) 4.0 - 10.5 K/uL   RBC 5.14 4.22 - 5.81 MIL/uL   Hemoglobin 13.5 13.0 - 17.0 g/dL   HCT 42.6 39 - 52 %   MCV 82.9 80.0 - 100.0 fL   MCH 26.3 26.0 - 34.0 pg   MCHC 31.7 30.0 - 36.0 g/dL   RDW 13.3 11.5 - 15.5 %   Platelets 279 150 - 400 K/uL   nRBC 0.0 0.0 - 0.2 %   Neutrophils Relative % 72 %   Neutro Abs 10.4 (H) 1.7 - 7.7 K/uL   Lymphocytes Relative 18 %   Lymphs Abs 2.6 0.7 - 4.0 K/uL   Monocytes Relative 8 %   Monocytes Absolute 1.2 (H) 0 - 1 K/uL   Eosinophils Relative 1 %   Eosinophils Absolute 0.2 0 - 0 K/uL   Basophils Relative 1 %   Basophils Absolute 0.1 0 - 0 K/uL   Immature Granulocytes 0 %   Abs Immature Granulocytes 0.06 0.00 - 0.07 K/uL    Comment: Performed at [facility] [address], [place], [place] [address]    MR FOOT LEFT W WO CONTRAST  Result Date: 06/22/2020 CLINICAL DATA:  Cellulitis of the left forefoot involving the dorsal aspect of the fourth and fifth toes. EXAM: MRI OF THE LEFT FOREFOOT WITHOUT AND WITH CONTRAST TECHNIQUE: Multiplanar, multisequence MR imaging of the left forefoot was performed both before and after administration of intravenous contrast. CONTRAST:  47mL GADAVIST GADOBUTROL 1 MMOL/ML IV SOLN COMPARISON:  Radiographs  dated 06/22/2020 FINDINGS: Bones/Joint/Cartilage There is abnormal edema and abnormal enhancement of the phalangeal bones of the little toe with peripheral enhancement and edema of the head of the fifth metatarsal. No discrete bone destruction. No significant joint effusions. Ligaments Intact. Muscles  and Tendons And enhancement around the flexor tendon at the level of the IP joint of the little toe consistent with tenosynovitis, likely infectious. Slight edema and enhancement deep to the extensor tendon at the proximal phalanx also consistent with tenosynovitis. There is a small amount of fluid in the tendon sheath of the extensor tendon tendons at the level the navicular and cuboid. There is overlying soft tissue edema and slight enhancement of those tissues which may represent cellulitis. Soft tissues After contrast administration there is an extensive area of poorly perfused or devitalized soft tissue at the plantar aspect of head of the fifth metatarsal extending over a 3.5 Cm area. This abnormal tissue extends in the webspace between the fourth and fifth metatarsal heads and the bases of the toes. There is a small inhomogeneous fluid collection at the dorsal aspect of the little toe on images 19 and 20 of series 5 which may represent a small abscess. IMPRESSION: 1. 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 Possible osteomyelitis of the head of the fifth metatarsal. 2. Cellulitis of the little toe with a small abscess in the dorsal aspect of the little toe. 3. Tenosynovitis of the flexor and extensor tendons of the little toe, likely infected. Electronically Signed   By: Francene Boyers M.D.   On: 06/22/2020 17:59   DG Foot Complete Left  Result Date: 06/22/2020 CLINICAL DATA:  Left foot cellulitis EXAM: LEFT FOOT - COMPLETE 3+ VIEW COMPARISON:  05/04/2020 FINDINGS: Soft tissue swelling is noted in the fifth digit consistent with the known history of cellulitis. No underlying bony erosive  changes are seen. Mild calcaneal spurring is noted. No fracture is seen. IMPRESSION: Soft tissue swelling without acute bony abnormality. Electronically Signed   By: Alcide Clever M.D.   On: 06/22/2020 13:57    Review of Systems Blood pressure 123/85, pulse 87, temperature 98.5 F (36.9 C), temperature source Oral, resp. rate 16, height 6\' 6"  (1.981 m), weight 124.7 kg, SpO2 97 %. Physical Exam NAD  Dermatological: Ulceration submetatarsal 5 with granular base and there is blister formation the dorsal MPJ there is cellulitic changes present in the forefoot.  There is warmth and swelling to the foot.  Serosanguineous drainage expressed there is no purulence.  No areas of crepitation.        Vascular: DP pulses 1/4 although to be limited due to swelling on the left side.  Neruologic: Sensation decreased  Musculoskeletal: No tenderness   Assessment/Plan: I reviewed the MRI as well as lab work with the patient.  Unfortunately MRI did confirm osteomyelitis at the fifth toe and possibly the fifth metatarsal head.  Given the infection as well as osteomyelitis we discussed limb salvage versus amputation in detail and discussed pros and cons of each option.  I did recommend partial fifth ray amputation.  After discussion he elects to proceed.  Discussed we will likely do this in a staged procedure with plan on doing a partial fifth ray amputation tomorrow leaving the area open and possible return to the operating room for wound debridement, delayed primary closure.  Will plan surgery tomorrow afternoon.  Discussed all alternatives, risks, complications the patient.  No promises or guarantees were given.  We discussed risks of surgery including, but not limited to, spread of infection, delayed or nonhealing, further amputation as well as general risks of surgery including stroke, heart attack, death.  He had an echo scheduled for tomorrow. Will plan for surgery tomorrow unless we need to delay  to  stabilize medically.   Recommend arterial studies.  Patient to be admitted to the hospitalist service. Discussed with the ED.   Vivi Barrack 06/22/2020, 7:19 PM  O: [phone] C: [phone]

## 2020-06-22 NOTE — ED Provider Notes (Signed)
[facility] EMERGENCY DEPARTMENT Provider Note   CSN: [id number] Arrival date & time: 06/22/20  0720     History Chief Complaint  Patient presents with  . Foot Injury    [name] is a 49 y.o. male with past medical history of CAD, ischemic cardiomyopathy, non-STEMI s/p stenting, CHF, diabetes presents to the ED for evaluation of left foot discoloration that he noticed this morning.  Describes it as a dark/black blister in between his left fourth and fifth toes.  Patient states he has had a wound in the bottom of this foot for about 1 month, this has been closely monitored by Dr. March Rummage at [facility].  He gets frequent wound treatments at the foot clinic and yesterday was told this wound looked better than usual.  A couple of days ago he noticed he is a very small blister on the top of his foot, he was seen by podiatry yesterday who prescribed him doxycycline.  He has taken 2 doses of doxycycline since.  States yesterday while at the podiatrist he did not have this discolored blister.  Reports associated foot swelling and soreness.  No fevers.  No calf pain or swelling.   HPI     Past Medical History:  Diagnosis Date  . CAD (coronary artery disease)    NSTEMI 10/2011: LHC 11/04/11: pLAD 90%, mLAD 60-70%, small D2 sub totally occluded at ostium, small OM1 90% ostial, 90% mid, mOM2 30%, oPL 80%, RCA 30%, dPDA 80%, EF 20% with ant AK.  PCI:  Promus DES to pLAD.  [name] Chronic systolic heart failure ([place])   . DM2 (diabetes mellitus, type 2) ([place])   . HTN (hypertension)   . Hyperlipidemia   . Ischemic cardiomyopathy    Echo 11/03/11: mod LVH, mild focal basal septal hypertrophy, EF 15%, grade 2 diast dysfxn, mild MR, mild to mod LAE, mild RVE, mild to mod reduced RVSF.  EF 3/5 50% by echo  . Obesity     Patient Active Problem List   Diagnosis Date Noted  . Testosterone deficiency 11/29/2016  . Acute systolic CHF (congestive heart failure) ([place]) 10/28/2016  .  Indigestion 07/11/2016  . Renal insufficiency 07/11/2016  . Chronic systolic heart failure ([place]) 11/13/2011  . CAD 11/13/2011  . Ischemic cardiomyopathy 11/04/2011  . Acute systolic congestive heart failure ([place]) 11/04/2011  . MI, acute, non ST segment elevation ([place]) 11/03/2011  . Diabetes mellitus type 2 in obese ([place]) 12/14/2010  . HYPERLIPIDEMIA 12/14/2010  . Obesity 12/14/2010  . Essential hypertension, benign 12/14/2010    Past Surgical History:  Procedure Laterality Date  . CARDIAC CATHETERIZATION N/A 10/25/2016   Procedure: Right/Left Heart Cath and Coronary Angiography;  Surgeon: Leonie Man, MD;  Location: [facility] CV LAB;  Service: Cardiovascular;  Laterality: N/A;  . CARDIAC CATHETERIZATION N/A 10/25/2016   Procedure: Coronary Stent Intervention;  Surgeon: Leonie Man, MD;  Location: [facility] CV LAB;  Service: Cardiovascular;  Laterality: N/A;  . CARDIAC CATHETERIZATION N/A 10/28/2016   Procedure: Coronary Stent Intervention;  Surgeon: Burnell Blanks, MD;  Location: [facility] CV LAB;  Service: Cardiovascular;  Laterality: N/A;  . LEFT AND RIGHT HEART CATHETERIZATION WITH CORONARY ANGIOGRAM N/A 11/04/2011   Procedure: LEFT AND RIGHT HEART CATHETERIZATION WITH CORONARY ANGIOGRAM;  Surgeon: Minus Breeding, MD;  Location: [facility] CATH LAB;  Service: Cardiovascular;  Laterality: N/A;  . NONE    . PERCUTANEOUS CORONARY STENT INTERVENTION (PCI-S)  11/04/2011   Procedure: PERCUTANEOUS CORONARY STENT INTERVENTION (  PCI-S);  Surgeon: Tonny Bollman, MD;  Location: [facility] CATH LAB;  Service: Cardiovascular;;       Family History  Problem Relation Age of Onset  . Heart disease Mother        MOTHER HAD CABG    Social History   Tobacco Use  . Smoking status: Never Smoker  . Smokeless tobacco: Never Used  Substance Use Topics  . Alcohol use: Yes    Alcohol/week: 1.0 standard drink    Types: 1 Shots of liquor per week    Comment: OCCASIONAL  . Drug use: No     Home Medications Prior to Admission medications   Medication Sig Start Date End Date Taking? Authorizing Provider  aspirin 81 MG chewable tablet Chew 1 tablet (81 mg total) by mouth daily. 10/31/16  Yes Graciella Freer, PA-C  atorvastatin (LIPITOR) 80 MG tablet Take 1 tablet (80 mg total) by mouth daily. Patient taking differently: Take 80 mg by mouth every evening.  02/04/20  Yes Laurey Morale, MD  carvedilol (COREG) 6.25 MG tablet Take 1 tablet (6.25 mg total) by mouth 2 (two) times daily with a meal. 02/04/20  Yes Laurey Morale, MD  cetirizine (ZYRTEC) 10 MG tablet Take 1 tablet (10 mg total) by mouth daily. Patient taking differently: Take 10 mg by mouth at bedtime.  02/21/20  Yes Hoy Register, MD  dapagliflozin propanediol (FARXIGA) 10 MG TABS tablet Take 10 mg by mouth daily before breakfast. 08/31/19  Yes Laurey Morale, MD  digoxin (LANOXIN) 0.125 MG tablet TAKE 1 TABLET (125 MCG TOTAL) BY MOUTH DAILY. Patient taking differently: Take 0.125 mg by mouth daily.  04/07/20  Yes Laurey Morale, MD  doxycycline (VIBRA-TABS) 100 MG tablet Take 1 tablet (100 mg total) by mouth 2 (two) times daily. 06/21/20  Yes Candelaria Stagers, DPM  furosemide (LASIX) 40 MG tablet Take 1 tablet (40 mg total) by mouth daily. 02/04/20  Yes Laurey Morale, MD  glipiZIDE (GLUCOTROL) 10 MG tablet TAKE 1 TABLET BY MOUTH TWICE DAILY BEFORE MEAL(S). Patient taking differently: Take 10 mg by mouth 2 (two) times daily before a meal.  01/25/20  Yes Newlin, Enobong, MD  Insulin Glargine (LANTUS SOLOSTAR) 100 UNIT/ML Solostar Pen Inject 20 Units into the skin daily. Patient taking differently: Inject 20 Units into the skin every evening.  01/25/20  Yes Hoy Register, MD  omeprazole (PRILOSEC) 20 MG capsule Take 1 capsule (20 mg total) by mouth daily. 02/04/20  Yes Laurey Morale, MD  sacubitril-valsartan (ENTRESTO) 97-103 MG Take 1 tablet by mouth 2 (two) times daily. 03/02/19  Yes Laurey Morale, MD   sildenafil (REVATIO) 20 MG tablet Take 1 tablet (20 mg total) by mouth as needed (for erectile dysfunction). 08/31/19  Yes Laurey Morale, MD  spironolactone (ALDACTONE) 50 MG tablet Take 1 tablet (50 mg total) by mouth daily. 05/24/20  Yes Laurey Morale, MD  Blood Glucose Monitoring Suppl (TRUE METRIX METER) DEVI 1 each by Does not apply route 3 (three) times daily. 03/16/19   Hoy Register, MD  glucose blood (TRUE METRIX BLOOD GLUCOSE TEST) test strip 1 each by Other route 3 (three) times daily. 03/16/19   Hoy Register, MD  Insulin Pen Needle (TRUEPLUS PEN NEEDLES) 31G X 5 MM MISC USE AS DIRECTED AT BEDTIME. 01/25/20   Hoy Register, MD  oxyCODONE-acetaminophen (PERCOCET) 10-325 MG tablet Take 1 tablet by mouth every 8 (eight) hours as needed for up to 5 days for pain. 06/21/20  06/26/20  Candelaria Stagers, DPM  oxyCODONE-acetaminophen (PERCOCET) 10-325 MG tablet Take 1 tablet by mouth every 8 (eight) hours as needed for up to 5 days for pain. 06/22/20 06/27/20  Candelaria Stagers, DPM  TRUEplus Lancets 28G MISC 1 each by Does not apply route 3 (three) times daily. 03/16/19   Hoy Register, MD    Allergies    Patient has no known allergies.  Review of Systems   Review of Systems  Skin: Positive for color change and wound.  All other systems reviewed and are negative.   Physical Exam Updated Vital Signs BP 109/80 (BP Location: Right Arm)   Pulse 80   Temp 98.5 F (36.9 C) (Oral)   Resp 18   Ht 6\' 6"  (1.981 m)   Wt 124.7 kg   SpO2 100%   BMI 31.78 kg/m   Physical Exam Constitutional:      Appearance: He is well-developed.  HENT:     Head: Normocephalic.     Nose: Nose normal.  Eyes:     General: Lids are normal.  Cardiovascular:     Rate and Rhythm: Normal rate.     Comments: 1+ DP pulses bilaterally. No calf edema or tenderness  Pulmonary:     Effort: Pulmonary effort is normal. No respiratory distress.  Musculoskeletal:        General: Normal range of motion.      Cervical back: Normal range of motion.     Comments: Full passive ROM of left 4th and 5th toes without pain.  Mild edema, erythema of left mid foot worst along dorsal aspect of 4th and 5th MTPs.  Full ROM of left ankle without pain.  Skin:    Comments: Ulcerated non tender wound in left sole of foot at base of 5th metatarsal/MTP. No fluctuance. No tenderness.  Small circular ulcer dorsal aspect of foot at base of 4th metatarsal, non tender. There is surrounding erythema, warmth, and mild tenderness. No drainage or bleeding.  Non tender bullae in between dorsal left 4th and 5th toes.  Moist skin in between the toes but no drainage, tenderness, fluctuance.  See picture  Neurological:     Mental Status: He is alert.     Comments: Sensation to light touch/rub intact in left toes bilaterally   Psychiatric:        Behavior: Behavior normal.          ED Results / Procedures / Treatments   Labs (all labs ordered are listed, but only abnormal results are displayed) Labs Reviewed  COMPREHENSIVE METABOLIC PANEL - Abnormal; Notable for the following components:      Result Value   CO2 20 (*)    Glucose, Bld 108 (*)    BUN 29 (*)    Creatinine, Ser 1.28 (*)    AST 11 (*)    All other components within normal limits  CBC WITH DIFFERENTIAL/PLATELET - Abnormal; Notable for the following components:   WBC 14.6 (*)    Neutro Abs 10.4 (*)    Monocytes Absolute 1.2 (*)    All other components within normal limits  LACTIC ACID, PLASMA  LACTIC ACID, PLASMA  CBG MONITORING, ED    EKG None  Radiology DG Foot Complete Left  Result Date: 06/22/2020 CLINICAL DATA:  Left foot cellulitis EXAM: LEFT FOOT - COMPLETE 3+ VIEW COMPARISON:  05/04/2020 FINDINGS: Soft tissue swelling is noted in the fifth digit consistent with the known history of cellulitis. No underlying bony erosive changes are  seen. Mild calcaneal spurring is noted. No fracture is seen. IMPRESSION: Soft tissue swelling without acute  bony abnormality. Electronically Signed   By: Alcide Clever M.D.   On: 06/22/2020 13:57    Procedures Procedures (including critical care time)  Medications Ordered in ED Medications  sodium chloride flush (NS) 0.9 % injection 3 mL (has no administration in time range)    ED Course  I have reviewed the triage vital signs and the nursing notes.  Pertinent labs & imaging results that were available during my care of the patient were reviewed by me and considered in my medical decision making (see chart for details).  Clinical Course as of Jun 22 1508  Thu Jun 22, 2020  1334 WBC(!): 14.6 [CG]  1334 Lactic Acid, Venous: 0.9 [CG]  1409 IMPRESSION: Soft tissue swelling without acute bony abnormality.  DG Foot Complete Left [CG]  1419 Podiatry repaged    [CG]  1504 Stable. Left foot lesion. Podiatry would like MRI and IV Vancomycin.Then touch base with Dr. Loreta Ave, podiatry. As well as follow up tomorrow.    [AL]    Clinical Course User Index [AL] Janeece Fitting, MD [CG] Jerrell Mylar   MDM Rules/Calculators/A&P                          42-year-old male presents for left foot blister and discoloration onset this morning.  Previous medical records available reviewed to assist with obtaining more history.  Triage nursing notes also reviewed to obtain more history today and assist with MDM.  Patient seen by podiatry yesterday for more proximal blister and started on doxycycline yesterday, has only taken 2 doses of this.  Clinically he has surrounding cellulitis in the left foot mostly concentrated around the blister and bullae at the base of the fourth and fifth MTPs.  No pain with passive range of motion of the toes or foot.  No proximal calf tenderness or edema.  No fever.  Distal pulses intact.  ER work-up initiated in triage including lab work, lactic acid.  These were personally reviewed and interpreted and show mild leukocytosis but normal lactic acid.  I have added  left foot x-ray.  1426: X-ray of the foot does not show subcutaneous air, obvious bone destruction.  Podiatry repaged  1455: Clinical presentation not consistent with SIRS/sepsis, osteomyelitis, abscess, DVT.  Overall patient well appearing, he has good follow up.  Has only taken 2 doses of doxycycline not technically failed outpatient antibiotic therapy.  Will plan on contacting podiatrist to confirm plan to discharge and close clinic follow in 24-48 hours.  Podiatry page x 3.   1306: spoke to Dr Germaine Pomfret who has reviewed patient's chart and last cultures. Recommend MRI with contrast for further evaluation and IV vancomycin while we wait. Patient will be handed off to oncoming ED resident who will follow up on MRI and page podiatry back for recommendations.  Final Clinical Impression(s) / ED Diagnoses Final diagnoses:  Cellulitis of left foot    Rx / DC Orders ED Discharge Orders    None       Jerrell Mylar 06/22/20 1509    Gerhard Munch, MD 06/22/20 1540

## 2020-06-22 NOTE — Telephone Encounter (Signed)
[place] from [facility] ED called requesting a consult from our on call doctor.   Pt is currently in ED under the care of PA Claudia Gibbens for an infected diabetic wound/blister.

## 2020-06-22 NOTE — ED Triage Notes (Signed)
Patient has been [facility] for 2 months due to wound on posterior surface of foot. Earlier this week developed swelling and bliter to top of left foot. Seen yesterday back at foot clinic and restarted on antibiotics. Concerned this am due to darkness and blackness at 5th toe, reports pain is improving

## 2020-06-22 NOTE — ED Notes (Signed)
Report attempted 

## 2020-06-22 NOTE — Progress Notes (Signed)
Pharmacy Antibiotic Note  [name] is a 49 y.o. male admitted on 06/22/2020 with cellulitis.  Pharmacy has been consulted for vancomycin dosing.  Patient presenting with cellulitis surround bullae on foot, concern for osteo. Afebrile. WBC count elevated at 14.6. Creatinine seems around baseline, today at 1.28. Vancomycin 2000 mg load already ordered. Outpatient doxycycline started 6/23. MRI pending. Wound culture 3/11 and 1/12 of this year both grew pan sensitive enterococcus faecalis.   Plan: Vancomycin 1 gram IV every 8 hours.  Goal trough 10-15 mcg/mL for cellulitis, 15-20 if osteomyelitis found Monitor renal function, clinical status, de-escalation, LOT   Follow up MRI   Height: 6\' 6"  (198.1 cm) Weight: 124.7 kg (275 lb) IBW/kg (Calculated) : 91.4  Temp (24hrs), Avg:98.5 F (36.9 C), Min:98.5 F (36.9 C), Max:98.5 F (36.9 C)  Recent Labs  Lab 06/22/20 0735  WBC 14.6*  CREATININE 1.28*  LATICACIDVEN 0.9    Estimated Creatinine Clearance: 106.8 mL/min (A) (by C-G formula based on SCr of 1.28 mg/dL (H)).    No Known Allergies  Antimicrobials this admission: vancomycin 6/24 >>  doxycycline PTA 6/23   Dose adjustments this admission: N/A  Microbiology results: none  Thank you for allowing pharmacy to be a part of this patient's care.  7/23, PharmD PGY-1 Pharmacy Resident   Please check amion for clinical pharmacist contact number 06/22/2020 3:40 PM

## 2020-06-22 NOTE — Telephone Encounter (Signed)
I called and spoke with them. MRI pending to see about admission.

## 2020-06-22 NOTE — Addendum Note (Signed)
Addended by: Nicholes Rough on: 06/22/2020 12:31 PM   Modules accepted: Orders

## 2020-06-22 NOTE — ED Provider Notes (Signed)
Medical Decision Making: Care of patient assumed from outgoing provider at  3:07 PM.  Agree with history, physical exam and plan.  See their note for further details.  Briefly, 49 y.o. male with PMH/PSH as below.  Chief Complaint  Patient presents with   Foot Injury    Past Medical History:  Diagnosis Date   CAD (coronary artery disease)    NSTEMI 10/2011: LHC 11/04/11: pLAD 90%, mLAD 60-70%, small D2 sub totally occluded at ostium, small OM1 90% ostial, 90% mid, mOM2 30%, oPL 80%, RCA 30%, dPDA 80%, EF 20% with ant AK.  PCI:  Promus DES to pLAD.   Chronic systolic heart failure (HCC)    DM2 (diabetes mellitus, type 2) (HCC)    HTN (hypertension)    Hyperlipidemia    Ischemic cardiomyopathy    Echo 11/03/11: mod LVH, mild focal basal septal hypertrophy, EF 15%, grade 2 diast dysfxn, mild MR, mild to mod LAE, mild RVE, mild to mod reduced RVSF.  EF 3/5 50% by echo   Obesity    Past Surgical History:  Procedure Laterality Date   CARDIAC CATHETERIZATION N/A 10/25/2016   Procedure: Right/Left Heart Cath and Coronary Angiography;  Surgeon: Marykay Lex, MD;  Location: [facility] INVASIVE CV LAB;  Service: Cardiovascular;  Laterality: N/A;   CARDIAC CATHETERIZATION N/A 10/25/2016   Procedure: Coronary Stent Intervention;  Surgeon: Marykay Lex, MD;  Location: [facility] INVASIVE CV LAB;  Service: Cardiovascular;  Laterality: N/A;   CARDIAC CATHETERIZATION N/A 10/28/2016   Procedure: Coronary Stent Intervention;  Surgeon: Kathleene Hazel, MD;  Location: [facility] INVASIVE CV LAB;  Service: Cardiovascular;  Laterality: N/A;   LEFT AND RIGHT HEART CATHETERIZATION WITH CORONARY ANGIOGRAM N/A 11/04/2011   Procedure: LEFT AND RIGHT HEART CATHETERIZATION WITH CORONARY ANGIOGRAM;  Surgeon: Rollene Rotunda, MD;  Location: [facility] CATH LAB;  Service: Cardiovascular;  Laterality: N/A;   NONE     PERCUTANEOUS CORONARY STENT INTERVENTION (PCI-S)  11/04/2011   Procedure: PERCUTANEOUS CORONARY STENT INTERVENTION (PCI-S);   Surgeon: Tonny Bollman, MD;  Location: [facility] CATH LAB;  Service: Cardiovascular;;     Results for orders placed or performed during the hospital encounter of 06/22/20  Lactic acid, plasma  Result Value Ref Range   Lactic Acid, Venous 0.9 0.5 - 1.9 mmol/L  Comprehensive metabolic panel  Result Value Ref Range   Sodium 135 135 - 145 mmol/L   Potassium 4.0 3.5 - 5.1 mmol/L   Chloride 102 98 - 111 mmol/L   CO2 20 (L) 22 - 32 mmol/L   Glucose, Bld 108 (H) 70 - 99 mg/dL   BUN 29 (H) 6 - 20 mg/dL   Creatinine, Ser 0.08 (H) 0.61 - 1.24 mg/dL   Calcium 9.2 8.9 - 67.6 mg/dL   Total Protein 7.5 6.5 - 8.1 g/dL   Albumin 3.6 3.5 - 5.0 g/dL   AST 11 (L) 15 - 41 U/L   ALT 13 0 - 44 U/L   Alkaline Phosphatase 50 38 - 126 U/L   Total Bilirubin 1.1 0.3 - 1.2 mg/dL   GFR calc non Af Amer >60 >60 mL/min   GFR calc Af Amer >60 >60 mL/min   Anion gap 13 5 - 15  CBC with Differential  Result Value Ref Range   WBC 14.6 (H) 4.0 - 10.5 K/uL   RBC 5.14 4.22 - 5.81 MIL/uL   Hemoglobin 13.5 13.0 - 17.0 g/dL   HCT 19.5 39 - 52 %   MCV 82.9 80.0 - 100.0 fL  MCH 26.3 26.0 - 34.0 pg   MCHC 31.7 30.0 - 36.0 g/dL   RDW 13.3 11.5 - 15.5 %   Platelets 279 150 - 400 K/uL   nRBC 0.0 0.0 - 0.2 %   Neutrophils Relative % 72 %   Neutro Abs 10.4 (H) 1.7 - 7.7 K/uL   Lymphocytes Relative 18 %   Lymphs Abs 2.6 0.7 - 4.0 K/uL   Monocytes Relative 8 %   Monocytes Absolute 1.2 (H) 0 - 1 K/uL   Eosinophils Relative 1 %   Eosinophils Absolute 0.2 0 - 0 K/uL   Basophils Relative 1 %   Basophils Absolute 0.1 0 - 0 K/uL   Immature Granulocytes 0 %   Abs Immature Granulocytes 0.06 0.00 - 0.07 K/uL  CBG monitoring, ED  Result Value Ref Range   Glucose-Capillary 92 70 - 99 mg/dL   Comment 1 Notify RN    Comment 2 Document in Chart    MR FOOT LEFT W WO CONTRAST  Result Date: 06/22/2020 CLINICAL DATA:  Cellulitis of the left forefoot involving the dorsal aspect of the fourth and fifth toes. EXAM: MRI OF THE LEFT  FOREFOOT WITHOUT AND WITH CONTRAST TECHNIQUE: Multiplanar, multisequence MR imaging of the left forefoot was performed both before and after administration of intravenous contrast. CONTRAST:  14mL GADAVIST GADOBUTROL 1 MMOL/ML IV SOLN COMPARISON:  Radiographs dated 06/22/2020 FINDINGS: Bones/Joint/Cartilage There is abnormal edema and abnormal enhancement of the phalangeal bones of the little toe with peripheral enhancement and edema of the head of the fifth metatarsal. No discrete bone destruction. No significant joint effusions. Ligaments Intact. Muscles and Tendons And enhancement around the flexor tendon at the level of the IP joint of the little toe consistent with tenosynovitis, likely infectious. Slight edema and enhancement deep to the extensor tendon at the proximal phalanx also consistent with tenosynovitis. There is a small amount of fluid in the tendon sheath of the extensor tendon tendons at the level the navicular and cuboid. There is overlying soft tissue edema and slight enhancement of those tissues which may represent cellulitis. Soft tissues After contrast administration there is an extensive area of poorly perfused or devitalized soft tissue at the plantar aspect of head of the fifth metatarsal extending over a 3.5 Cm area. This abnormal tissue extends in the webspace between the fourth and fifth metatarsal heads and the bases of the toes. There is a small inhomogeneous fluid collection at the dorsal aspect of the little toe on images 19 and 20 of series 5 which may represent a small abscess. IMPRESSION: 1. 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 Possible osteomyelitis of the head of the fifth metatarsal. 2. Cellulitis of the little toe with a small abscess in the dorsal aspect of the little toe. 3. Tenosynovitis of the flexor and extensor tendons of the little toe, likely infected. Electronically Signed   By: Lorriane Shire M.D.   On: 06/22/2020 17:59   DG Foot Complete  Left  Result Date: 06/22/2020 CLINICAL DATA:  Left foot cellulitis EXAM: LEFT FOOT - COMPLETE 3+ VIEW COMPARISON:  05/04/2020 FINDINGS: Soft tissue swelling is noted in the fifth digit consistent with the known history of cellulitis. No underlying bony erosive changes are seen. Mild calcaneal spurring is noted. No fracture is seen. IMPRESSION: Soft tissue swelling without acute bony abnormality. Electronically Signed   By: Inez Catalina M.D.   On: 06/22/2020 13:57    Medications  sodium chloride flush (NS) 0.9 % injection  3 mL (has no administration in time range)  vancomycin (VANCOREADY) IVPB 2000 mg/400 mL (2,000 mg Intravenous New Bag/Given 06/22/20 1754)  vancomycin (VANCOCIN) IVPB 1000 mg/200 mL premix (1,000 mg Intravenous Not Given 06/22/20 1834)  gadobutrol (GADAVIST) 1 MMOL/ML injection 10 mL (10 mLs Intravenous Contrast Given 06/22/20 1730)  oxyCODONE-acetaminophen (PERCOCET/ROXICET) 5-325 MG per tablet 2 tablet (2 tablets Oral Given 06/22/20 1818)    Clinical Course as of Jun 23 258  Thu Jun 22, 2020  1334 WBC(!): 14.6 [CG]  1334 Lactic Acid, Venous: 0.9 [CG]  1409 IMPRESSION: Soft tissue swelling without acute bony abnormality.  DG Foot Complete Left [CG]  1419 Podiatry repaged    [CG]  1504 Stable. Left foot lesion. Podiatry would like MRI and IV Vancomycin.Then touch base with Dr. Loreta Ave, podiatry. As well as follow up tomorrow.    [AL]    Clinical Course User Index [AL] Janeece Fitting, MD [CG] Liberty Handy, PA-C    Interval MDM: MRI returned with concerning findings for osteomyelitis of the left fifth toe and fifth metacarpal.  Podiatry paged, they plan to take the patient to surgery tomorrow.  Hospitalist called for admission.  Signout given patient into the hospital.  Patient updated on initial plan based on MRI reading.  Podiatry in the ED and saw the patient and talk to him.  Diagnosis, treatment and plan of care was discussed and agreed upon with patient.   Patient comfortable with admission at this time.     Janeece Fitting, MD 06/23/20 0300    Lorre Nick, MD 06/27/20 1052

## 2020-06-22 NOTE — H&P (Signed)
History and Physical    Glenn McgeeHarold [name] WJX:[id number] DOB: 10-09-1971 DOA: 06/22/2020  PCP: Hoy RegisterNewlin, Enobong, MD  Patient coming from: Home   Chief Complaint:  Chief Complaint  Patient presents with   Foot Injury     HPI:    49 year old male with past medical history of ischemic cardiomyopathy with systolic and diastolic congestive heart failure (Echo 08/2019 EF 40-45% with diastolic dysfunction), coronary artery disease, diabetes mellitus type 2 (insluin dependent), hypertension, chronic kidney disease stage III (per history), obesity who presents [name] department with left foot pain and purplish discoloration.  Patient explains that he has been experiencing an ulceration of the plantar surface of the left foot for approximately 1 and half months.  He has been following as an outpatient with podiatry and is currently seeing Dr. Samuella [name] and Dr. Ardelle [name] with podiatry.  He explains that earlier this week however, he developed a blister on the dorsal surface of his left foot.  This was associated with mild pain that he describes as "tightness" and swelling.  This pain is nonradiating, worse with weightbearing or movement of the affected extremity.  In the days that followed, this blister continuously ruptured, draining some clear what sounds to be nonpurulent drainage.  Patient's pain and redness persisted.  Patient denies associated fevers, weakness, chills or changes in appetite.  Patient was evaluated in podiatry clinic on 6/23 where the wounds of the left foot were debrided and patient was sent home with oral doxycycline.  Despite patient taking doxycycline he states that he woke up the morning 6/24 with his left foot, particularly the fifth digit of the left foot appearing to turn purple.  This prompted the patient to present to [facility] emergency department for evaluation.  Upon evaluation in the emergency department, MRI of the left foot was performed  revealing evidence of osteomyelitis of the phalangeal bones of the little toe with possible osteomyelitis of the head of the fifth metatarsal.  Case was discussed with Dr. Loreta [name] who recommended the patient be placed on antibiotic therapy, n.p.o. after midnight and surgical intervention in the morning.  Patient was administered a dose of vancomycin.  The hospitalist group was then called to assess the patient for admission the hospital.   Review of Systems: A 10-system review of systems has been performed and all systems are negative with the exception of what is listed in the HPI.    Past Medical History:  Diagnosis Date   CAD (coronary artery disease)    NSTEMI 10/2011: LHC 11/04/11: pLAD 90%, mLAD 60-70%, small D2 sub totally occluded at ostium, small OM1 90% ostial, 90% mid, mOM2 30%, oPL 80%, RCA 30%, dPDA 80%, EF 20% with ant AK.  PCI:  Promus DES to pLAD.   Chronic systolic heart failure (HCC)    DM2 (diabetes mellitus, type 2) (HCC)    HTN (hypertension)    Hyperlipidemia    Ischemic cardiomyopathy    Echo 11/03/11: mod LVH, mild focal basal septal hypertrophy, EF 15%, grade 2 diast dysfxn, mild MR, mild to mod LAE, mild RVE, mild to mod reduced RVSF.  EF 3/5 50% by echo   Obesity     Past Surgical History:  Procedure Laterality Date   CARDIAC CATHETERIZATION N/A 10/25/2016   Procedure: Right/Left Heart Cath and Coronary Angiography;  Surgeon: Glenn Lexavid W Harding, MD;  Location[facility] INVASIVE CV LAB;  Service: Cardiovascular;  Laterality: N/A;   CARDIAC CATHETERIZATION N/A 10/25/2016   Procedure: Coronary Stent Intervention;  Surgeon: Glenn Lex, MD;  Location: [facility] INVASIVE CV LAB;  Service: Cardiovascular;  Laterality: N/A;   CARDIAC CATHETERIZATION N/A 10/28/2016   Procedure: Coronary Stent Intervention;  Surgeon: Glenn Hazel, MD;  Location: [facility] INVASIVE CV LAB;  Service: Cardiovascular;  Laterality: N/A;   LEFT AND RIGHT HEART CATHETERIZATION WITH CORONARY  ANGIOGRAM N/A 11/04/2011   Procedure: LEFT AND RIGHT HEART CATHETERIZATION WITH CORONARY ANGIOGRAM;  Surgeon: Glenn Rotunda, MD;  Location: [facility] CATH LAB;  Service: Cardiovascular;  Laterality: N/A;   NONE     PERCUTANEOUS CORONARY STENT INTERVENTION (PCI-S)  11/04/2011   Procedure: PERCUTANEOUS CORONARY STENT INTERVENTION (PCI-S);  Surgeon: Glenn Bollman, MD;  Location: [facility] CATH LAB;  Service: Cardiovascular;;     reports that he has never smoked. He has never used smokeless tobacco. He reports current alcohol use of about 1.0 standard drink of alcohol per week. He reports that he does not use drugs.  No Known Allergies  Family History  Problem Relation Age of Onset   Heart disease Father    Heart disease Mother        MOTHER HAD CABG     Prior to Admission medications   Medication Sig Start Date End Date Taking? Authorizing Provider  aspirin 81 MG chewable tablet Chew 1 tablet (81 mg total) by mouth daily. 10/31/16  Yes Graciella Freer, PA-C  atorvastatin (LIPITOR) 80 MG tablet Take 1 tablet (80 mg total) by mouth daily. Patient taking differently: Take 80 mg by mouth every evening.  02/04/20  Yes Laurey Morale, MD  carvedilol (COREG) 6.25 MG tablet Take 1 tablet (6.25 mg total) by mouth 2 (two) times daily with a meal. 02/04/20  Yes Laurey Morale, MD  cetirizine (ZYRTEC) 10 MG tablet Take 1 tablet (10 mg total) by mouth daily. Patient taking differently: Take 10 mg by mouth at bedtime.  02/21/20  Yes Hoy Register, MD  dapagliflozin propanediol (FARXIGA) 10 MG TABS tablet Take 10 mg by mouth daily before breakfast. 08/31/19  Yes Laurey Morale, MD  digoxin (LANOXIN) 0.125 MG tablet TAKE 1 TABLET (125 MCG TOTAL) BY MOUTH DAILY. Patient taking differently: Take 0.125 mg by mouth daily.  04/07/20  Yes Laurey Morale, MD  doxycycline (VIBRA-TABS) 100 MG tablet Take 1 tablet (100 mg total) by mouth 2 (two) times daily. 06/21/20  Yes Candelaria Stagers, DPM  furosemide (LASIX) 40 MG  tablet Take 1 tablet (40 mg total) by mouth daily. 02/04/20  Yes Laurey Morale, MD  glipiZIDE (GLUCOTROL) 10 MG tablet TAKE 1 TABLET BY MOUTH TWICE DAILY BEFORE MEAL(S). Patient taking differently: Take 10 mg by mouth 2 (two) times daily before a meal.  01/25/20  Yes Newlin, Enobong, MD  Insulin Glargine (LANTUS SOLOSTAR) 100 UNIT/ML Solostar Pen Inject 20 Units into the skin daily. Patient taking differently: Inject 20 Units into the skin every evening.  01/25/20  Yes Hoy Register, MD  omeprazole (PRILOSEC) 20 MG capsule Take 1 capsule (20 mg total) by mouth daily. 02/04/20  Yes Laurey Morale, MD  sacubitril-valsartan (ENTRESTO) 97-103 MG Take 1 tablet by mouth 2 (two) times daily. 03/02/19  Yes Laurey Morale, MD  sildenafil (REVATIO) 20 MG tablet Take 1 tablet (20 mg total) by mouth as needed (for erectile dysfunction). 08/31/19  Yes Laurey Morale, MD  spironolactone (ALDACTONE) 50 MG tablet Take 1 tablet (50 mg total) by mouth daily. 05/24/20  Yes Laurey Morale, MD  Blood Glucose Monitoring Suppl (TRUE METRIX  METER) DEVI 1 each by Does not apply route 3 (three) times daily. 03/16/19   Hoy Register, MD  glucose blood (TRUE METRIX BLOOD GLUCOSE TEST) test strip 1 each by Other route 3 (three) times daily. 03/16/19   Hoy Register, MD  Insulin Pen Needle (TRUEPLUS PEN NEEDLES) 31G X 5 MM MISC USE AS DIRECTED AT BEDTIME. 01/25/20   Hoy Register, MD  oxyCODONE-acetaminophen (PERCOCET) 10-325 MG tablet Take 1 tablet by mouth every 8 (eight) hours as needed for up to 5 days for pain. 06/21/20 06/26/20  Candelaria Stagers, DPM  oxyCODONE-acetaminophen (PERCOCET) 10-325 MG tablet Take 1 tablet by mouth every 8 (eight) hours as needed for up to 5 days for pain. 06/22/20 06/27/20  Candelaria Stagers, DPM  TRUEplus Lancets 28G MISC 1 each by Does not apply route 3 (three) times daily. 03/16/19   Hoy Register, MD    Physical Exam: Vitals:   06/22/20 1855 06/22/20 1905 06/22/20 1915 06/22/20 1930  BP:     120/79  Pulse: 86 91 87 86  Resp:    16  Temp:      TempSrc:      SpO2: 99% 99% 97% 95%  Weight:      Height:        Constitutional: Acute alert and oriented x3, no associated distress.  Patient is obese. Skin: Shallow ulceration noted of the lateral right foot without drainage or foul smell.  Notable shallow ulceration on the dorsal surface at the juncture of the fourth and fifth toes small on a purulent drainage from this particular wound without foul smell.  Additionally there is a 2 cm diameter ulceration noted on the plantar surface of the fifth toe.  No other rashes, good skin turgor noted. Eyes: Pupils are equally reactive to light.  No evidence of scleral icterus or conjunctival pallor.  ENMT: Moist mucous membranes noted.  Posterior pharynx clear of any exudate or lesions.   Neck: normal, supple, no masses, no thyromegaly.  No evidence of jugular venous distension.   Respiratory: clear to auscultation bilaterally, no wheezing, no crackles. Normal respiratory effort. No accessory muscle use.  Cardiovascular: Regular rate and rhythm, no murmurs / rubs / gallops.  Notable edema of the left foot.  2+ pedal pulses. No carotid bruits.  Chest:   Nontender without crepitus or deformity.   Back:   Nontender without crepitus or deformity. Abdomen: Abdomen is protuberant but soft and nontender.  No evidence of intra-abdominal masses.  Positive bowel sounds noted in all quadrants.   Musculoskeletal: Edema of the left foot as noted above.  Ulcerations of the left foot as noted in the skin examination.  No other deformities noted.  Good ROM, no contractures. Normal muscle tone.  Neurologic: CN 2-12 grossly intact. Sensation intact, strength noted to be 5 out of 5 in all 4 extremities.  Patient is following all commands.  Patient is responsive to verbal stimuli.   Psychiatric: Patient presents as a normal mood with appropriate affect.  Patient seems to possess insight as to theircurrent situation.       Labs on Admission: I have personally reviewed following labs and imaging studies -   CBC: Recent Labs  Lab 06/22/20 0735  WBC 14.6*  NEUTROABS 10.4*  HGB 13.5  HCT 42.6  MCV 82.9  PLT 279   Basic Metabolic Panel: Recent Labs  Lab 06/22/20 0735  NA 135  K 4.0  CL 102  CO2 20*  GLUCOSE 108*  BUN 29*  CREATININE 1.28*  CALCIUM 9.2   GFR: Estimated Creatinine Clearance: 106.8 mL/min (A) (by C-G formula based on SCr of 1.28 mg/dL (H)). Liver Function Tests: Recent Labs  Lab 06/22/20 0735  AST 11*  ALT 13  ALKPHOS 50  BILITOT 1.1  PROT 7.5  ALBUMIN 3.6   No results for input(s): LIPASE, AMYLASE in the last 168 hours. No results for input(s): AMMONIA in the last 168 hours. Coagulation Profile: No results for input(s): INR, PROTIME in the last 168 hours. Cardiac Enzymes: No results for input(s): CKTOTAL, CKMB, CKMBINDEX, TROPONINI in the last 168 hours. BNP (last 3 results) No results for input(s): PROBNP in the last 8760 hours. HbA1C: No results for input(s): HGBA1C in the last 72 hours. CBG: Recent Labs  Lab 06/22/20 0734  GLUCAP 92   Lipid Profile: No results for input(s): CHOL, HDL, LDLCALC, TRIG, CHOLHDL, LDLDIRECT in the last 72 hours. Thyroid Function Tests: No results for input(s): TSH, T4TOTAL, FREET4, T3FREE, THYROIDAB in the last 72 hours. Anemia Panel: No results for input(s): VITAMINB12, FOLATE, FERRITIN, TIBC, IRON, RETICCTPCT in the last 72 hours. Urine analysis:    Component Value Date/Time   COLORURINE AMBER (A) 10/22/2016 2327   APPEARANCEUR CLEAR 10/22/2016 2327   LABSPEC 1.037 (H) 10/22/2016 2327   PHURINE 6.0 10/22/2016 2327   GLUCOSEU NEGATIVE 10/22/2016 2327   HGBUR NEGATIVE 10/22/2016 2327   BILIRUBINUR SMALL (A) 10/22/2016 2327   KETONESUR NEGATIVE 10/22/2016 2327   PROTEINUR >300 (A) 10/22/2016 2327   UROBILINOGEN 0.2 12/10/2010 2012   NITRITE NEGATIVE 10/22/2016 2327   LEUKOCYTESUR NEGATIVE 10/22/2016 2327     Radiological Exams on Admission - Personally Reviewed: MR FOOT LEFT W WO CONTRAST  Result Date: 06/22/2020 CLINICAL DATA:  Cellulitis of the left forefoot involving the dorsal aspect of the fourth and fifth toes. EXAM: MRI OF THE LEFT FOREFOOT WITHOUT AND WITH CONTRAST TECHNIQUE: Multiplanar, multisequence MR imaging of the left forefoot was performed both before and after administration of intravenous contrast. CONTRAST:  60mL GADAVIST GADOBUTROL 1 MMOL/ML IV SOLN COMPARISON:  Radiographs dated 06/22/2020 FINDINGS: Bones/Joint/Cartilage There is abnormal edema and abnormal enhancement of the phalangeal bones of the little toe with peripheral enhancement and edema of the head of the fifth metatarsal. No discrete bone destruction. No significant joint effusions. Ligaments Intact. Muscles and Tendons And enhancement around the flexor tendon at the level of the IP joint of the little toe consistent with tenosynovitis, likely infectious. Slight edema and enhancement deep to the extensor tendon at the proximal phalanx also consistent with tenosynovitis. There is a small amount of fluid in the tendon sheath of the extensor tendon tendons at the level the navicular and cuboid. There is overlying soft tissue edema and slight enhancement of those tissues which may represent cellulitis. Soft tissues After contrast administration there is an extensive area of poorly perfused or devitalized soft tissue at the plantar aspect of head of the fifth metatarsal extending over a 3.5 Cm area. This abnormal tissue extends in the webspace between the fourth and fifth metatarsal heads and the bases of the toes. There is a small inhomogeneous fluid collection at the dorsal aspect of the little toe on images 19 and 20 of series 5 which may represent a small abscess. IMPRESSION: 1. 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 Possible osteomyelitis of the head of the fifth metatarsal. 2. Cellulitis of the little toe with  a small abscess in the dorsal aspect of the little toe. 3. Tenosynovitis of  the flexor and extensor tendons of the little toe, likely infected. Electronically Signed   By: Francene Boyers M.D.   On: 06/22/2020 17:59   DG Foot Complete Left  Result Date: 06/22/2020 CLINICAL DATA:  Left foot cellulitis EXAM: LEFT FOOT - COMPLETE 3+ VIEW COMPARISON:  05/04/2020 FINDINGS: Soft tissue swelling is noted in the fifth digit consistent with the known history of cellulitis. No underlying bony erosive changes are seen. Mild calcaneal spurring is noted. No fracture is seen. IMPRESSION: Soft tissue swelling without acute bony abnormality. Electronically Signed   By: Alcide Clever M.D.   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  Additional clinical evidence of surrounding cellulitis based on examination.  Placing patient on empiric regimen of intravenous ceftriaxone and vancomycin.  Patient does have a wound culture in March growing out Enterococcus faecalis however this is from a right foot wound at that time.  Case is already been discussed with Dr. Ardelle Anton with podiatry by the emergency department staff.  They agree with intravenous antibiotic therapy and recommend n.p.o. after midnight for surgical intervention tomorrow, possible amputation.  Hydrating gently with intravenous isotonic fluids considering history of congestive heart failure  Blood cultures ordered  No significant drainage at this time or deep wound to perform local culture  As needed opiate-based analgesics for associated pain  Active Problems:   Cellulitis of left foot   Please see assessment and plan above    Uncontrolled type 2 diabetes mellitus with stage 3 chronic kidney disease, with long-term current use of insulin  (HCC)   Longstanding known history of uncontrolled diabetes, patient admits to only checking his blood sugars twice weekly.  Obtaining hemoglobin A1c  Continuing home regimen of Lantus 20 units nightly  Accu-Cheks before every meal and nightly with sliding scale insulin  Will titrate on basal bolus insulin therapy based on blood sugars    Essential hypertension   Continue home regimen of antihypertensive therapy    Chronic combined systolic and diastolic congestive heart failure (HCC)   No evidence of cardiogenic volume overload at this time  Continue home regimen of diuretics postoperatively    CAD   Currently chest pain-free  Monitoring patient on telemetry  Continue home regimen of beta-blocker, statin therapy  Temporarily holding aspirin due to impending surgery.    CKD (chronic kidney disease), stage III   History of chronic kidney disease stage III based on review of records however creatinine clearance today is excellent.  Monitoring renal function and electrolytes with serial chemistries.    Mixed diabetic hyperlipidemia associated with type 2 diabetes mellitus (HCC)    Continue home regimen of statin therapy   Code Status:  Full code Family Communication: deferred   Status is: Observation  The patient remains OBS appropriate and will d/c before 2 midnights.  Dispo: The patient is from: Home              Anticipated d/c is to: Home              Anticipated d/c date is: 2 days              Patient currently is not medically stable to d/c.        Marinda Elk MD [facility] Pager [phone]  If 7PM-7AM, please contact  night-coverage [email or web address] Use universal [place] password for that web site. If you do not have the password, please call the hospital operator.  06/22/2020, 8:09 PM

## 2020-06-22 NOTE — ED Notes (Signed)
Admitting at bedside 

## 2020-06-22 NOTE — Telephone Encounter (Signed)
Done

## 2020-06-22 NOTE — Telephone Encounter (Signed)
The patient called and stated that he [facility] not [facility] not [facility]  It can go to [facility] pharmacy please assist

## 2020-06-22 NOTE — ED Notes (Signed)
Pt transported to MRI 

## 2020-06-23 ENCOUNTER — Inpatient Hospital Stay (HOSPITAL_COMMUNITY): Payer: Self-pay | Admitting: Anesthesiology

## 2020-06-23 ENCOUNTER — Telehealth: Payer: Self-pay | Admitting: Podiatry

## 2020-06-23 ENCOUNTER — Encounter (HOSPITAL_COMMUNITY): Payer: Self-pay | Admitting: Internal Medicine

## 2020-06-23 ENCOUNTER — Inpatient Hospital Stay (HOSPITAL_COMMUNITY): Payer: Self-pay

## 2020-06-23 ENCOUNTER — Ambulatory Visit (HOSPITAL_COMMUNITY): Payer: Self-pay

## 2020-06-23 ENCOUNTER — Ambulatory Visit (HOSPITAL_COMMUNITY): Admission: RE | Admit: 2020-06-23 | Payer: Self-pay | Source: Ambulatory Visit

## 2020-06-23 ENCOUNTER — Encounter (HOSPITAL_COMMUNITY)
Admission: RE | Disposition: A | Discharge status: Home / Self Care | Payer: Self-pay | Source: Home / Self Care | Attending: Internal Medicine

## 2020-06-23 HISTORY — PX: AMPUTATION: SHX166

## 2020-06-23 LAB — CBC WITH DIFFERENTIAL/PLATELET
Abs Immature Granulocytes: 0.03 10*3/uL (ref 0.00–0.07)
Basophils Absolute: 0 10*3/uL (ref 0.0–0.1)
Basophils Relative: 0 %
Eosinophils Absolute: 0.3 10*3/uL (ref 0.0–0.5)
Eosinophils Relative: 3 %
HCT: 40.8 % (ref 39.0–52.0)
Hemoglobin: 13.1 g/dL (ref 13.0–17.0)
Immature Granulocytes: 0 %
Lymphocytes Relative: 19 %
Lymphs Abs: 2 10*3/uL (ref 0.7–4.0)
MCH: 26.4 pg (ref 26.0–34.0)
MCHC: 32.1 g/dL (ref 30.0–36.0)
MCV: 82.3 fL (ref 80.0–100.0)
Monocytes Absolute: 0.8 10*3/uL (ref 0.1–1.0)
Monocytes Relative: 8 %
Neutro Abs: 7.4 10*3/uL (ref 1.7–7.7)
Neutrophils Relative %: 70 %
Platelets: 236 10*3/uL (ref 150–400)
RBC: 4.96 MIL/uL (ref 4.22–5.81)
RDW: 13.2 % (ref 11.5–15.5)
WBC: 10.6 10*3/uL — ABNORMAL HIGH (ref 4.0–10.5)
nRBC: 0 % (ref 0.0–0.2)

## 2020-06-23 LAB — COMPREHENSIVE METABOLIC PANEL
ALT: 14 U/L (ref 0–44)
AST: 11 U/L — ABNORMAL LOW (ref 15–41)
Albumin: 3.1 g/dL — ABNORMAL LOW (ref 3.5–5.0)
Alkaline Phosphatase: 40 U/L (ref 38–126)
Anion gap: 10 (ref 5–15)
BUN: 25 mg/dL — ABNORMAL HIGH (ref 6–20)
CO2: 24 mmol/L (ref 22–32)
Calcium: 9.1 mg/dL (ref 8.9–10.3)
Chloride: 103 mmol/L (ref 98–111)
Creatinine, Ser: 1.12 mg/dL (ref 0.61–1.24)
GFR calc Af Amer: >60 mL/min (ref >60)
GFR calc non Af Amer: >60 mL/min (ref >60)
Glucose, Bld: 123 mg/dL — ABNORMAL HIGH (ref 70–99)
Potassium: 4.3 mmol/L (ref 3.5–5.1)
Sodium: 137 mmol/L (ref 135–145)
Total Bilirubin: 1 mg/dL (ref 0.3–1.2)
Total Protein: 7.2 g/dL (ref 6.5–8.1)

## 2020-06-23 LAB — SURGICAL PCR SCREEN
MRSA, PCR: NEGATIVE
Staphylococcus aureus: NEGATIVE

## 2020-06-23 LAB — GLUCOSE, CAPILLARY
Glucose-Capillary: 135 mg/dL — ABNORMAL HIGH (ref 70–99)
Glucose-Capillary: 125 mg/dL — ABNORMAL HIGH (ref 70–99)
Glucose-Capillary: 103 mg/dL — ABNORMAL HIGH (ref 70–99)
Glucose-Capillary: 106 mg/dL — ABNORMAL HIGH (ref 70–99)
Glucose-Capillary: 201 mg/dL — ABNORMAL HIGH (ref 70–99)

## 2020-06-23 LAB — MAGNESIUM: Magnesium: 2.1 mg/dL (ref 1.7–2.4)

## 2020-06-23 LAB — APTT: aPTT: 32 seconds (ref 24–36)

## 2020-06-23 LAB — LACTIC ACID, PLASMA: Lactic Acid, Venous: 1.2 mmol/L (ref 0.5–1.9)

## 2020-06-23 LAB — PROTIME-INR
INR: 1.2 (ref 0.8–1.2)
Prothrombin Time: 14.2 seconds (ref 11.4–15.2)

## 2020-06-23 LAB — HIV ANTIBODY (ROUTINE TESTING W REFLEX): HIV Screen 4th Generation wRfx: NONREACTIVE

## 2020-06-23 SURGERY — AMPUTATION, FOOT, RAY
Anesthesia: Monitor Anesthesia Care | Site: Foot | Laterality: Left

## 2020-06-23 MED ORDER — FENTANYL CITRATE (PF) 100 MCG/2ML IJ SOLN: 50.0000 ug | Freq: Once | INTRAMUSCULAR | Status: AC

## 2020-06-23 MED ORDER — CHLORHEXIDINE GLUCONATE 0.12 % MT SOLN
OROMUCOSAL | Status: AC
  Administered 2020-06-23: 15 mL via OROMUCOSAL
  Filled 2020-06-23: qty 15

## 2020-06-23 MED ORDER — MIDAZOLAM HCL 2 MG/2ML IJ SOLN: 2.0000 mg | Freq: Once | INTRAMUSCULAR | Status: AC

## 2020-06-23 MED ORDER — MIDAZOLAM HCL 5 MG/5ML IJ SOLN
INTRAMUSCULAR | Status: DC | PRN
Start: 2020-06-23 — End: 2020-06-23
  Administered 2020-06-23 (×2): 1 mg via INTRAVENOUS

## 2020-06-23 MED ORDER — MIDAZOLAM HCL 2 MG/2ML IJ SOLN
INTRAMUSCULAR | Status: AC
  Administered 2020-06-23: 2 mg via INTRAVENOUS
  Filled 2020-06-23: qty 2

## 2020-06-23 MED ORDER — LACTATED RINGERS IV SOLN
INTRAVENOUS | Status: DC | PRN
Start: 2020-06-23 — End: 2020-06-23

## 2020-06-23 MED ORDER — FUROSEMIDE 40 MG PO TABS
40.0000 mg | ORAL_TABLET | Freq: Every day | ORAL | Status: DC
  Administered 2020-06-24 – 2020-06-27 (×3): 40 mg via ORAL
  Filled 2020-06-23 (×3): qty 1

## 2020-06-23 MED ORDER — LACTATED RINGERS IV SOLN: INTRAVENOUS | Status: DC

## 2020-06-23 MED ORDER — OXYCODONE HCL 5 MG PO TABS: 5.0000 mg | ORAL_TABLET | Freq: Once | ORAL | Status: DC | PRN

## 2020-06-23 MED ORDER — CHLORHEXIDINE GLUCONATE CLOTH 2 % EX PADS
6.0000 | MEDICATED_PAD | Freq: Once | CUTANEOUS | Status: AC
  Administered 2020-06-23: 6 via TOPICAL

## 2020-06-23 MED ORDER — FENTANYL CITRATE (PF) 250 MCG/5ML IJ SOLN
INTRAMUSCULAR | Status: DC | PRN
  Administered 2020-06-23: 50 ug via INTRAVENOUS
  Administered 2020-06-23 (×2): 25 ug via INTRAVENOUS
  Administered 2020-06-23: 50 ug via INTRAVENOUS

## 2020-06-23 MED ORDER — PROPOFOL 10 MG/ML IV BOLUS
INTRAVENOUS | Status: AC
  Filled 2020-06-23: qty 40

## 2020-06-23 MED ORDER — FENTANYL CITRATE (PF) 100 MCG/2ML IJ SOLN: 25.0000 ug | INTRAMUSCULAR | Status: DC | PRN

## 2020-06-23 MED ORDER — OXYCODONE HCL 5 MG/5ML PO SOLN: 5.0000 mg | Freq: Once | ORAL | Status: DC | PRN

## 2020-06-23 MED ORDER — FENTANYL CITRATE (PF) 100 MCG/2ML IJ SOLN
INTRAMUSCULAR | Status: AC
  Administered 2020-06-23: 50 ug via INTRAVENOUS
  Filled 2020-06-23: qty 2

## 2020-06-23 MED ORDER — FENTANYL CITRATE (PF) 250 MCG/5ML IJ SOLN
INTRAMUSCULAR | Status: AC
  Filled 2020-06-23: qty 5

## 2020-06-23 MED ORDER — PROPOFOL 10 MG/ML IV BOLUS
INTRAVENOUS | Status: DC | PRN
  Administered 2020-06-23: 40 mg via INTRAVENOUS
  Administered 2020-06-23: 10 mg via INTRAVENOUS

## 2020-06-23 MED ORDER — SPIRONOLACTONE 25 MG PO TABS
50.0000 mg | ORAL_TABLET | Freq: Every day | ORAL | Status: DC
  Administered 2020-06-24 – 2020-06-27 (×3): 50 mg via ORAL
  Filled 2020-06-23 (×3): qty 2

## 2020-06-23 MED ORDER — MIDAZOLAM HCL 2 MG/2ML IJ SOLN
INTRAMUSCULAR | Status: AC
  Filled 2020-06-23: qty 2

## 2020-06-23 MED ORDER — PROMETHAZINE HCL 25 MG/ML IJ SOLN: 6.2500 mg | INTRAMUSCULAR | Status: DC | PRN

## 2020-06-23 MED ORDER — CHLORHEXIDINE GLUCONATE 0.12 % MT SOLN: 15.0000 mL | Freq: Once | OROMUCOSAL | Status: AC

## 2020-06-23 MED ORDER — ACETAMINOPHEN 500 MG PO TABS
1000.0000 mg | ORAL_TABLET | Freq: Once | ORAL | Status: AC
  Administered 2020-06-23: 1000 mg via ORAL
  Filled 2020-06-23: qty 2

## 2020-06-23 MED ORDER — MUPIROCIN 2 % EX OINT
1.0000 "application " | TOPICAL_OINTMENT | Freq: Two times a day (BID) | CUTANEOUS | Status: DC
  Filled 2020-06-23: qty 22

## 2020-06-23 MED ORDER — BUPIVACAINE HCL (PF) 0.5 % IJ SOLN
INTRAMUSCULAR | Status: DC | PRN
Start: 2020-06-23 — End: 2020-06-23
  Administered 2020-06-23: 30 mL via PERINEURAL

## 2020-06-23 MED ORDER — 0.9 % SODIUM CHLORIDE (POUR BTL) OPTIME
TOPICAL | Status: DC | PRN
  Administered 2020-06-23: 1000 mL

## 2020-06-23 MED ORDER — LIDOCAINE HCL (CARDIAC) PF 100 MG/5ML IV SOSY
PREFILLED_SYRINGE | INTRAVENOUS | Status: DC | PRN
  Administered 2020-06-23: 20 mg via INTRATRACHEAL

## 2020-06-23 SURGICAL SUPPLY — 48 items
BANDAGE ESMARK 6X9 LF (GAUZE/BANDAGES/DRESSINGS) IMPLANT
BLADE AVERAGE 25MMX9MM (BLADE) ×1
BLADE AVERAGE 25X9 (BLADE) ×2 IMPLANT
BLADE SURG 10 STRL SS (BLADE) ×3 IMPLANT
BNDG CMPR 9X6 STRL LF SNTH (GAUZE/BANDAGES/DRESSINGS)
BNDG COHESIVE 4X5 TAN STRL (GAUZE/BANDAGES/DRESSINGS) ×3 IMPLANT
BNDG ELASTIC 4X5.8 VLCR STR LF (GAUZE/BANDAGES/DRESSINGS) ×3 IMPLANT
BNDG ESMARK 6X9 LF (GAUZE/BANDAGES/DRESSINGS)
BNDG GAUZE ELAST 4 BULKY (GAUZE/BANDAGES/DRESSINGS) ×3 IMPLANT
COVER WAND RF STERILE (DRAPES) ×3 IMPLANT
CUFF TOURN SGL QUICK 34 (TOURNIQUET CUFF)
CUFF TOURN SGL QUICK 42 (TOURNIQUET CUFF) IMPLANT
CUFF TRNQT CYL 34X4.125X (TOURNIQUET CUFF) IMPLANT
DECANTER SPIKE VIAL GLASS SM (MISCELLANEOUS) ×3 IMPLANT
DRAPE C-ARM MINI (DRAPES) ×3 IMPLANT
DRAPE U-SHAPE 47X51 STRL (DRAPES) ×6 IMPLANT
DURAPREP 26ML APPLICATOR (WOUND CARE) ×3 IMPLANT
ELECT REM PT RETURN 9FT ADLT (ELECTROSURGICAL) ×3
ELECTRODE REM PT RTRN 9FT ADLT (ELECTROSURGICAL) ×1 IMPLANT
GAUZE SPONGE 4X4 12PLY STRL (GAUZE/BANDAGES/DRESSINGS) ×3 IMPLANT
GAUZE XEROFORM 5X9 LF (GAUZE/BANDAGES/DRESSINGS) ×3 IMPLANT
GLOVE BIO SURGEON STRL SZ8 (GLOVE) ×6 IMPLANT
GOWN STRL REUS W/ TWL LRG LVL3 (GOWN DISPOSABLE) ×1 IMPLANT
GOWN STRL REUS W/ TWL XL LVL3 (GOWN DISPOSABLE) ×1 IMPLANT
GOWN STRL REUS W/TWL LRG LVL3 (GOWN DISPOSABLE) ×3
GOWN STRL REUS W/TWL XL LVL3 (GOWN DISPOSABLE) ×3
KIT BASIN OR (CUSTOM PROCEDURE TRAY) ×3 IMPLANT
KIT TURNOVER KIT B (KITS) ×3 IMPLANT
NEEDLE 25GX 5/8IN NON SAFETY (NEEDLE) ×3 IMPLANT
NS IRRIG 1000ML POUR BTL (IV SOLUTION) ×3 IMPLANT
PACK ORTHO EXTREMITY (CUSTOM PROCEDURE TRAY) ×3 IMPLANT
PAD ARMBOARD 7.5X6 YLW CONV (MISCELLANEOUS) ×6 IMPLANT
PAD CAST 4YDX4 CTTN HI CHSV (CAST SUPPLIES) ×1 IMPLANT
PADDING CAST COTTON 4X4 STRL (CAST SUPPLIES) ×3
SPONGE LAP 18X18 RF (DISPOSABLE) ×6 IMPLANT
STAPLER VISISTAT 35W (STAPLE) ×3 IMPLANT
STOCKINETTE IMPERVIOUS LG (DRAPES) IMPLANT
SUT ETHILON 2 0 PSLX (SUTURE) IMPLANT
SUT PROLENE 3 0 PS 2 (SUTURE) ×6 IMPLANT
SWAB COLLECTION DEVICE MRSA (MISCELLANEOUS) IMPLANT
SWAB CULTURE ESWAB REG 1ML (MISCELLANEOUS) IMPLANT
SYR CONTROL 10ML LL (SYRINGE) ×3 IMPLANT
TOWEL GREEN STERILE (TOWEL DISPOSABLE) ×3 IMPLANT
TOWEL GREEN STERILE FF (TOWEL DISPOSABLE) ×3 IMPLANT
TUBE CONNECTING 12'X1/4 (SUCTIONS) ×1
TUBE CONNECTING 12X1/4 (SUCTIONS) ×2 IMPLANT
WATER STERILE IRR 1000ML POUR (IV SOLUTION) ×3 IMPLANT
YANKAUER SUCT BULB TIP NO VENT (SUCTIONS) ×3 IMPLANT

## 2020-06-23 NOTE — Telephone Encounter (Signed)
[facility] from [facility] is calling because pt wanted to know if saurgry was still gonna be done to day she asked if you would give her a call [phone]

## 2020-06-23 NOTE — Plan of Care (Signed)
°  Problem: Education: °Goal: Knowledge of General Education information will improve °Description: Including pain rating scale, medication(s)/side effects and non-pharmacologic comfort measures °Outcome: Progressing °  °Problem: Health Behavior/Discharge Planning: °Goal: Ability to manage health-related needs will improve °Outcome: Progressing °  °Problem: Clinical Measurements: °Goal: Ability to maintain clinical measurements within normal limits will improve °Outcome: Progressing °Goal: Will remain free from infection °Outcome: Progressing °Goal: Diagnostic test results will improve °Outcome: Progressing °Goal: Respiratory complications will improve °Outcome: Progressing °Goal: Cardiovascular complication will be avoided °Outcome: Progressing °  °Problem: Activity: °Goal: Risk for activity intolerance will decrease °Outcome: Progressing °  °Problem: Nutrition: °Goal: Adequate nutrition will be maintained °Outcome: Progressing °  °Problem: Coping: °Goal: Level of anxiety will decrease °Outcome: Progressing °  °Problem: Elimination: °Goal: Will not experience complications related to bowel motility °Outcome: Progressing °Goal: Will not experience complications related to urinary retention °Outcome: Progressing °  °Problem: Pain Managment: °Goal: General experience of comfort will improve °Outcome: Progressing °  °Problem: Safety: °Goal: Ability to remain free from injury will improve °Outcome: Progressing °  °Problem: Skin Integrity: °Goal: Risk for impaired skin integrity will decrease °Outcome: Progressing °  °Problem: Education: °Goal: Ability to describe self-care measures that may prevent or decrease complications (Diabetes Survival Skills Education) will improve °Outcome: Progressing °Goal: Individualized Educational Video(s) °Outcome: Progressing °  °Problem: Coping: °Goal: Ability to adjust to condition or change in health will improve °Outcome: Progressing °  °Problem: Fluid Volume: °Goal: Ability to  maintain a balanced intake and output will improve °Outcome: Progressing °  °Problem: Health Behavior/Discharge Planning: °Goal: Ability to identify and utilize available resources and services will improve °Outcome: Progressing °Goal: Ability to manage health-related needs will improve °Outcome: Progressing °  °Problem: Metabolic: °Goal: Ability to maintain appropriate glucose levels will improve °Outcome: Progressing °  °Problem: Nutritional: °Goal: Maintenance of adequate nutrition will improve °Outcome: Progressing °Goal: Progress toward achieving an optimal weight will improve °Outcome: Progressing °  °Problem: Skin Integrity: °Goal: Risk for impaired skin integrity will decrease °Outcome: Progressing °  °Problem: Tissue Perfusion: °Goal: Adequacy of tissue perfusion will improve °Outcome: Progressing °  °Problem: Clinical Measurements: °Goal: Ability to avoid or minimize complications of infection will improve °Outcome: Progressing °  °Problem: Skin Integrity: °Goal: Skin integrity will improve °Outcome: Progressing °  °

## 2020-06-23 NOTE — Anesthesia Preprocedure Evaluation (Addendum)
Anesthesia Evaluation  Patient identified by MRN, date of birth, ID band Patient awake    Reviewed: Allergy & Precautions, NPO status , Patient's Chart, lab work & pertinent test results, reviewed documented beta blocker date and time   History of Anesthesia Complications Negative for: history of anesthetic complications  Airway Mallampati: II  TM Distance: >3 FB Neck ROM: Full    Dental  (+) Missing,    Pulmonary neg pulmonary ROS,    Pulmonary exam normal        Cardiovascular hypertension, Pt. on medications and Pt. on home beta blockers + CAD, + Past MI (2012) and +CHF  Normal cardiovascular exam  TTe 08/2019: EF 40-45%, impaired relaxation, valves ok   Neuro/Psych negative neurological ROS  negative psych ROS   GI/Hepatic Neg liver ROS, GERD  Medicated and Controlled,  Endo/Other  diabetes, Type 2, Insulin Dependent, Oral Hypoglycemic Agents  Renal/GU Renal InsufficiencyRenal disease  negative genitourinary   Musculoskeletal negative musculoskeletal ROS (+)   Abdominal   Peds  Hematology negative hematology ROS (+)   Anesthesia Other Findings Day of surgery medications reviewed with patient.  Reproductive/Obstetrics negative OB ROS                            Anesthesia Physical Anesthesia Plan  ASA: III  Anesthesia Plan: MAC and Regional   Post-op Pain Management:    Induction:   PONV Risk Score and Plan: 1 and Treatment may vary due to age or medical condition, Propofol infusion, Ondansetron and Midazolam  Airway Management Planned: Natural Airway and Simple Face Mask  Additional Equipment: None  Intra-op Plan:   Post-operative Plan:   Informed Consent: I have reviewed the patients History and Physical, chart, labs and discussed the procedure including the risks, benefits and alternatives for the proposed anesthesia with the patient or authorized representative who has  indicated his/her understanding and acceptance.       Plan Discussed with: CRNA  Anesthesia Plan Comments:        Anesthesia Quick Evaluation

## 2020-06-23 NOTE — Progress Notes (Signed)
Patient seen and evaluated at bedside in the preop holding area.  Still extensive cellulitic changes present to the dorsal lateral aspect the foot and bulla present on the dorsal fifth MPJ.  There is purulence coming from the wound submetatarsal 5.  We long discussion today in regards to limb salvage versus amputation to the toe, partial fifth ray amputation.  After long discussion he is in agreement to proceed with partial fifth ray amputation.  We discussed the surgery as well as postoperative course.  Discussed all alternatives, risks, complications.  No promises given.  Surgical consent form signed.  N.p.o. after midnight.  His dad is present and I will see him after the surgery to discuss the intraoperative findings.  Discussed possible need to return to the operating room possibly Monday for debridement, VAC or graft.

## 2020-06-23 NOTE — Progress Notes (Signed)
Notified Dr Ardelle Anton of the patient refusing to sign consent.

## 2020-06-23 NOTE — Consult Note (Signed)
WOC Nurse Consult Note: Patient receiving care in [facility] 5N14.  Podiatry is managing the wound on the left foot. Reason for Consult: bilateral foot wounds Wound type: right foot has a DFU on the plantar surface of the 5th metatarsal head Pressure Injury POA: Yes/No/NA Measurement: 0.5 cm x 0.5 cm Wound bed: pink Drainage (amount, consistency, odor) none Periwound: calloused.  The dorsum of the foot has some pink discoloration along the lateral edge of the foot and before you get to the toe.  The patient states the discoloration has gotten lighter in color.  He has been being managed by Podiatry Services at the [facility] clinic for both foot wounds for some time. Dressing procedure/placement/frequency: Wash right foot wound with soap and water. Pat dry. Place a small piece of Aquacel [place] [id number]) over the area on the plantar surface, 5th toe.  Cover with a small foam dressing.  Change every 2 days. Monitor the wound area(s) for worsening of condition such as: Signs/symptoms of infection,  Increase in size,  Development of or worsening of odor, Development of pain, or increased pain at the affected locations.  Notify the medical team if any of these develop.  Thank you for the consult.  Discussed plan of care with the patient.  WOC nurse will not follow at this time.  Please re-consult the WOC team if needed.  Helmut Muster, RN, MSN, CWOCN, CNS-BC, pager [phone]

## 2020-06-23 NOTE — Brief Op Note (Signed)
06/23/2020  6:03 PM  PATIENT:  [name]  49 y.o. male  PRE-OPERATIVE DIAGNOSIS:  Osteomyelitis  POST-OPERATIVE DIAGNOSIS:  Osteomyelitis  PROCEDURE:  Procedure(s): AMPUTATION RAY 5th (Left)  SURGEON:  Surgeon(s) and Role:    * Trula Slade, DPM - Primary  PHYSICIAN ASSISTANT:   ASSISTANTS: none   ANESTHESIA:   MAC  EBL:  25 mL   BLOOD ADMINISTERED:none  DRAINS: none   LOCAL MEDICATIONS USED:  None  SPECIMEN:  Source of Specimen:  bone for pathology; wound culture  DISPOSITION OF SPECIMEN:  PATHOLOGY  COUNTS:  YES  TOURNIQUET:  * Missing tourniquet times found for documented tourniquets in log: [id number] *  DICTATION: .Viviann Spare Dictation  PLAN OF CARE: Admit to inpatient   PATIENT DISPOSITION:  PACU - hemodynamically stable.   Delay start of Pharmacological VTE agent (>24hrs) due to surgical blood loss or risk of bleeding: no  Intraoperative findings: Abscess around 5th MTPJ, this was cultured. No proximal tracking noted. Preformed partial 5th ray amputation. Will continue IV antibiotics, await culture. Will determine if need to return to the OR possibly Monday for debridement and possible wound VAC/graft vs going home with local wound care.

## 2020-06-23 NOTE — Telephone Encounter (Signed)
I called and spoke with the patient earlier. He was refusing to sign the consent form or come down for surgery. He states his foot looks a lot better and his WBC has improved. He wants to hold off on amputation. We discussed I&D and bone biopsy but will see him prior to surgery and further evaluate.

## 2020-06-23 NOTE — Progress Notes (Signed)
PROGRESS NOTE  [name] ZCH:[id number] DOB: Sep 03, 1971 DOA: 06/22/2020 PCP: Charlott Rakes, MD  HPI/Recap of past 69 hours: 49 year old male with past medical history of ischemic cardiomyopathy with systolic and diastolic congestive heart failure (Echo 08/2019 EF 28-78% with diastolic dysfunction), coronary artery disease, diabetes mellitus type 2 (insluin dependent), hypertension, chronic kidney disease stage III (per history), obesity who presents to [facility] emergency department with left foot pain and purplish left fifth toe discoloration.  Patient explains that he has been experiencing an ulceration of the plantar surface of the left foot for approximately 1 and half months.  He has been following as an outpatient with podiatry and is currently seeing Dr. March Rummage and Dr. Jacqualyn Posey with podiatry.  He explains that earlier this week however, he developed a blister on the dorsal surface of his left foot.  This was associated with mild pain that he describes as "tightness" and swelling.  This pain is nonradiating, worse with weightbearing or movement of the affected extremity.  In the days that followed, this blister continuously ruptured, draining some clear what sounds to be nonpurulent drainage.  Patient's pain and redness persisted.  Patient denies associated fevers, weakness, chills or changes in appetite.  Patient was evaluated in podiatry clinic on 06/21/20 where the wounds of the left foot were debrided and patient was sent home with oral doxycycline.  Despite patient taking doxycycline he states that he woke up the morning 6/24 with his left foot, particularly the fifth digit of the left foot appearing to turn purple.  This prompted the patient to present to [facility] emergency department for evaluation.  In the emergency department, MRI of the left foot was performed revealing evidence of osteomyelitis of the phalangeal bones of the little toe with possible  osteomyelitis of the head of the fifth metatarsal.  Case was discussed with Dr. Earleen Newport who recommended the patient be placed on antibiotic therapy, n.p.o. after midnight and surgical intervention in the morning.  Patient was administered a dose of vancomycin.  The hospitalist group was then called to assess the patient for admission to the hospital.  06/23/20: Seen and examined . Reports he has no pain in his left foot. Has a pressure like sensation when he stands on it. No other complaints.    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 (HCC) Active Problems:   Uncontrolled type 2 diabetes mellitus with stage 3 chronic kidney disease, with long-term current use of insulin (HCC)   Essential hypertension   Chronic combined systolic and diastolic congestive heart failure (HCC)   CAD   CKD (chronic kidney disease), stage III   Mixed diabetic hyperlipidemia associated with type 2 diabetes mellitus (HCC)   Cellulitis of left foot   Ulcer of right foot (HCC)  Osteomyelitis of fifth toe of left foot (HCC)  Osteomyelitis of the left fifth toe and metatarsal based on MRI imaging with concurrent leukocytosis.  Additional clinical evidence of surrounding cellulitis based on examination.  Continue intravenous ceftriaxone and vancomycin.    Patient does have a wound culture in March growing out Enterococcus faecalis however this is from a right foot wound at that time.  Case is already been discussed with Dr. Jacqualyn Posey with podiatry by the emergency department staff.  They agree with intravenous antibiotic therapy and recommend n.p.o. after midnight for surgical intervention possible amputation.  Hydrating gently with intravenous isotonic fluids considering history of congestive heart failure  Blood cultures in process, continue  to follow  No significant drainage at this time or deep wound to perform local culture  As needed opiate-based analgesics for  associated pain    Cellulitis of left foot  Management per above.  Local wound care.    Uncontrolled type 2 diabetes mellitus with hyperglycemia and stage 2 chronic kidney disease with long-term current use of insulin (HCC)  Longstanding known history of uncontrolled diabetes, patient admits to only checking his blood sugars twice weekly.  Hemoglobin A1c 9.3.  Continuing home regimen of Lantus 20 units nightly  Accu-Cheks before every meal and nightly with sliding scale insulin  Will titrate on basal bolus insulin therapy based on blood sugars    Essential hypertension Blood pressure is at goal.  Continue home regimen of antihypertensive therapy    Chronic combined systolic and diastolic congestive heart failure (HCC)  No evidence of cardiogenic volume overload at this time  Euvolemic on exam  Continue home regimen of diuretics postoperatively  Start strict I's and O's and daily weight.  Continue cardiac medications    CAD  Denies any anginal symptoms  Monitoring patient on telemetry  Continue home regimen of beta-blocker, statin therapy  Temporarily holding aspirin due to possible surgical procedure.    CKD (chronic kidney disease), stage II  History of chronic kidney disease stage II based on review of records however creatinine clearance today is excellent.  Currently at his baseline renal function.  Continue to avoid nephrotoxins and hypotension  Monitor urine output  Monitoring renal function and electrolytes with serial chemistries.    Mixed diabetic hyperlipidemia associated with type 2 diabetes mellitus (HCC)  Continue home regimen of statin therapy   Code Status:  Full code  Family Communication:  None at bedside.  Status is: Inpatient  The patient remains inpatient appropriate and will d/c after 2 midnights.  Dispo: The patient is from: Home  Anticipated d/c is to: Home  Anticipated d/c date is:  06/25/20  Patient currently is not medically stable to d/c due to possible surgical procedure and ongoing treatment for osteomyelitis with IV antibiotics.     Objective: Vitals:   06/22/20 2340 06/23/20 0026 06/23/20 0408 06/23/20 0814  BP: 122/84 128/83 104/72 (!) 134/94  Pulse: 73 84 83 90  Resp: 17 18 18 16   Temp:  98.3 F (36.8 C) 98.4 F (36.9 C) 98.3 F (36.8 C)  TempSrc:  Oral Oral Oral  SpO2: 94% 100% 98% 96%  Weight:      Height:        Intake/Output Summary (Last 24 hours) at 06/23/2020 1330 Last data filed at 06/23/2020 0500 Gross per 24 hour  Intake 1500 ml  Output --  Net 1500 ml   Filed Weights   06/22/20 0728  Weight: 124.7 kg    Exam:  . General: 49 y.o. year-old male well developed well nourished in no acute distress.  Alert and oriented x3. . Cardiovascular: Regular rate and rhythm with no rubs or gallops.  No thyromegaly or JVD noted.   49 Respiratory: Clear to auscultation with no wheezes or rales. Good inspiratory effort. . Abdomen: Soft nontender nondistended with normal bowel sounds x4 quadrants. . Musculoskeletal: Trace left lower extremity edema. 2/4 pulses in all 4 extremities.  Left fifth toe with discolorated skin . Psychiatry: Mood is appropriate for condition and setting   Data Reviewed: CBC: Recent Labs  Lab 06/22/20 0735 06/23/20 0343  WBC 14.6* 10.6*  NEUTROABS 10.4* 7.4  HGB 13.5 13.1  HCT 42.6 40.8  MCV 82.9 82.3  PLT 279 236   Basic Metabolic Panel: Recent Labs  Lab 06/22/20 0735 06/23/20 0343  NA 135 137  K 4.0 4.3  CL 102 103  CO2 20* 24  GLUCOSE 108* 123*  BUN 29* 25*  CREATININE 1.28* 1.12  CALCIUM 9.2 9.1  MG  --  2.1   GFR: Estimated Creatinine Clearance: 122 mL/min (by C-G formula based on SCr of 1.12 mg/dL). Liver Function Tests: Recent Labs  Lab 06/22/20 0735 06/23/20 0343  AST 11* 11*  ALT 13 14  ALKPHOS 50 40  BILITOT 1.1 1.0  PROT 7.5 7.2  ALBUMIN 3.6 3.1*   No results for  input(s): LIPASE, AMYLASE in the last 168 hours. No results for input(s): AMMONIA in the last 168 hours. Coagulation Profile: Recent Labs  Lab 06/23/20 0343  INR 1.2   Cardiac Enzymes: No results for input(s): CKTOTAL, CKMB, CKMBINDEX, TROPONINI in the last 168 hours. BNP (last 3 results) No results for input(s): PROBNP in the last 8760 hours. HbA1C: Recent Labs    06/22/20 0749  HGBA1C 9.3*   CBG: Recent Labs  Lab 06/22/20 0734 06/22/20 2144 06/23/20 0820 06/23/20 1117  GLUCAP 92 133* 135* 125*   Lipid Profile: No results for input(s): CHOL, HDL, LDLCALC, TRIG, CHOLHDL, LDLDIRECT in the last 72 hours. Thyroid Function Tests: No results for input(s): TSH, T4TOTAL, FREET4, T3FREE, THYROIDAB in the last 72 hours. Anemia Panel: No results for input(s): VITAMINB12, FOLATE, FERRITIN, TIBC, IRON, RETICCTPCT in the last 72 hours. Urine analysis:    Component Value Date/Time   COLORURINE AMBER (A) 10/22/2016 2327   APPEARANCEUR CLEAR 10/22/2016 2327   LABSPEC 1.037 (H) 10/22/2016 2327   PHURINE 6.0 10/22/2016 2327   GLUCOSEU NEGATIVE 10/22/2016 2327   HGBUR NEGATIVE 10/22/2016 2327   BILIRUBINUR SMALL (A) 10/22/2016 2327   KETONESUR NEGATIVE 10/22/2016 2327   PROTEINUR >300 (A) 10/22/2016 2327   UROBILINOGEN 0.2 12/10/2010 2012   NITRITE NEGATIVE 10/22/2016 2327   LEUKOCYTESUR NEGATIVE 10/22/2016 2327   Sepsis Labs: @LABRCNTIP (procalcitonin:4,lacticidven:4)  ) Recent Results (from the past 240 hour(s))  SARS Coronavirus 2 by RT PCR (hospital order, performed in [facility] hospital lab) Nasopharyngeal Nasopharyngeal Swab     Status: None   Collection Time: 06/22/20  8:35 PM   Specimen: Nasopharyngeal Swab  Result Value Ref Range Status   SARS Coronavirus 2 NEGATIVE NEGATIVE Final    Comment: (NOTE) SARS-CoV-2 target nucleic acids are NOT DETECTED.  The SARS-CoV-2 RNA is generally detectable in upper and lower respiratory specimens during the acute phase of  infection. The lowest concentration of SARS-CoV-2 viral copies this assay can detect is 250 copies / mL. A negative result does not preclude SARS-CoV-2 infection and should not be used as the sole basis for treatment or other patient management decisions.  A negative result may occur with improper specimen collection / handling, submission of specimen other than nasopharyngeal swab, presence of viral mutation(s) within the areas targeted by this assay, and inadequate number of viral copies (<250 copies / mL). A negative result must be combined with clinical observations, patient history, and epidemiological information.  Fact Sheet for Patients:   06/24/20  Fact Sheet for Healthcare Providers: BoilerBrush.com.cy  This test is not yet approved or  cleared by the https://pope.com/ FDA and has been authorized for detection and/or diagnosis of SARS-CoV-2 by FDA under an Emergency Use Authorization (EUA).  This EUA will remain in effect (meaning this test can be used) for the duration  of the COVID-19 declaration under Section 564(b)(1) of the Act, 21 U.S.C. section 360bbb-3(b)(1), unless the authorization is terminated or revoked sooner.  Performed at [facility], [address], [place], [place] [address]   Surgical PCR screen     Status: None   Collection Time: 06/23/20  5:40 AM   Specimen: Nasal Mucosa; Nasal Swab  Result Value Ref Range Status   MRSA, PCR NEGATIVE NEGATIVE Final   Staphylococcus aureus NEGATIVE NEGATIVE Final    Comment: (NOTE) The Xpert SA Assay (FDA approved for NASAL specimens in patients 12 years of age and older), is one component of a comprehensive surveillance program. It is not intended to diagnose infection nor to guide or monitor treatment. Performed at [facility], [address] [place], [place] [address]       Studies: MR FOOT LEFT W WO CONTRAST  Result Date:  06/22/2020 CLINICAL DATA:  Cellulitis of the left forefoot involving the dorsal aspect of the fourth and fifth toes. EXAM: MRI OF THE LEFT FOREFOOT WITHOUT AND WITH CONTRAST TECHNIQUE: Multiplanar, multisequence MR imaging of the left forefoot was performed both before and after administration of intravenous contrast. CONTRAST:  83mL GADAVIST GADOBUTROL 1 MMOL/ML IV SOLN COMPARISON:  Radiographs dated 06/22/2020 FINDINGS: Bones/Joint/Cartilage There is abnormal edema and abnormal enhancement of the phalangeal bones of the little toe with peripheral enhancement and edema of the head of the fifth metatarsal. No discrete bone destruction. No significant joint effusions. Ligaments Intact. Muscles and Tendons And enhancement around the flexor tendon at the level of the IP joint of the little toe consistent with tenosynovitis, likely infectious. Slight edema and enhancement deep to the extensor tendon at the proximal phalanx also consistent with tenosynovitis. There is a small amount of fluid in the tendon sheath of the extensor tendon tendons at the level the navicular and cuboid. There is overlying soft tissue edema and slight enhancement of those tissues which may represent cellulitis. Soft tissues After contrast administration there is an extensive area of poorly perfused or devitalized soft tissue at the plantar aspect of head of the fifth metatarsal extending over a 3.5 Cm area. This abnormal tissue extends in the webspace between the fourth and fifth metatarsal heads and the bases of the toes. There is a small inhomogeneous fluid collection at the dorsal aspect of the little toe on images 19 and 20 of series 5 which may represent a small abscess. IMPRESSION: 1. 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 Possible osteomyelitis of the head of the fifth metatarsal. 2. Cellulitis of the little toe with a small abscess in the dorsal aspect of the little toe. 3. Tenosynovitis of the flexor and extensor  tendons of the little toe, likely infected. Electronically Signed   By: Francene Boyers M.D.   On: 06/22/2020 17:59   DG Foot Complete Left  Result Date: 06/22/2020 CLINICAL DATA:  Left foot cellulitis EXAM: LEFT FOOT - COMPLETE 3+ VIEW COMPARISON:  05/04/2020 FINDINGS: Soft tissue swelling is noted in the fifth digit consistent with the known history of cellulitis. No underlying bony erosive changes are seen. Mild calcaneal spurring is noted. No fracture is seen. IMPRESSION: Soft tissue swelling without acute bony abnormality. Electronically Signed   By: Alcide Clever M.D.   On: 06/22/2020 13:57    Scheduled Meds: . atorvastatin  80 mg Oral QPM  . carvedilol  6.25 mg Oral BID WC  . Chlorhexidine Gluconate Cloth  6 each Topical Once  .  digoxin  0.125 mg Oral Daily  . [START ON 06/24/2020] furosemide  40 mg Oral Daily  . insulin aspart  0-15 Units Subcutaneous TID AC & HS  . insulin glargine  20 Units Subcutaneous QPM  . loratadine  10 mg Oral Daily  . mupirocin ointment  1 application Nasal BID  . pantoprazole  40 mg Oral Daily  . sacubitril-valsartan  1 tablet Oral BID  . sodium chloride flush  3 mL Intravenous Once  . [START ON 06/24/2020] spironolactone  50 mg Oral Daily    Continuous Infusions: . cefTRIAXone (ROCEPHIN)  IV Stopped (06/22/20 2145)  . vancomycin 1,000 mg (06/23/20 0914)     LOS: 0 days     Darlin Droparole N Emmalyne Giacomo, MD [facility] Pager [phone]  If 7PM-7AM, please contact night-coverage [email or web address] Password [email or web address] 06/23/2020, 1:30 PM

## 2020-06-23 NOTE — Transfer of Care (Signed)
Immediate Anesthesia Transfer of Care Note  Patient: [name]  Procedure(s) Performed: AMPUTATION RAY 5th (Left Foot)  Patient Location: PACU  Anesthesia Type:MAC and Regional  Level of Consciousness: awake, alert  and oriented  Airway & Oxygen Therapy: Patient Spontanous Breathing  Post-op Assessment: Report given to RN and Post -op Vital signs reviewed and stable  Post vital signs: Reviewed and stable  Last Vitals:  Vitals Value Taken Time  BP 111/83 06/23/20 1800  Temp    Pulse 79 06/23/20 1801  Resp 11 06/23/20 1801  SpO2 98 % 06/23/20 1801  Vitals shown include unvalidated device data.  Last Pain:  Vitals:   06/23/20 1445  TempSrc: Oral  PainSc:          Complications: No complications documented.

## 2020-06-23 NOTE — Anesthesia Procedure Notes (Signed)
Anesthesia Regional Block: Ankle block   Pre-Anesthetic Checklist: ,, timeout performed, Correct Patient, Correct Site, Correct Laterality, Correct Procedure, Correct Position, site marked, Risks and benefits discussed, pre-op evaluation,  At surgeon's request and post-op pain management  Laterality: Left  Prep: Maximum Sterile Barrier Precautions used, chloraprep       Needles:  Injection technique: Single-shot  Needle Type: Echogenic Needle     Needle Length: 4cm  Needle Gauge: 25     Additional Needles:   Narrative:  Start time: 06/23/2020 4:31 PM End time: 06/23/2020 4:34 PM  Performed by: Personally  Anesthesiologist: Kaylyn Layer, MD  Additional Notes: Risks, benefits, and alternative discussed. Patient gave consent for procedure. Patient prepped and draped in sterile fashion. Sedation administered, patient remains easily responsive to voice. Local anesthetic given in 5cc increments with no signs or symptoms of intravascular injection. No pain or paraesthesias with injection. Patient monitored throughout procedure with signs of LAST or immediate complications. Tolerated well.   Glenn Greenhouse, MD

## 2020-06-24 LAB — GLUCOSE, CAPILLARY
Glucose-Capillary: 144 mg/dL — ABNORMAL HIGH (ref 70–99)
Glucose-Capillary: 188 mg/dL — ABNORMAL HIGH (ref 70–99)
Glucose-Capillary: 193 mg/dL — ABNORMAL HIGH (ref 70–99)
Glucose-Capillary: 203 mg/dL — ABNORMAL HIGH (ref 70–99)

## 2020-06-24 NOTE — Progress Notes (Signed)
PROGRESS NOTE  [name] GUR:[id number] DOB: [birth year] DOA: 06/22/2020 PCP: Hoy Register, MD  HPI/Recap of past 87 hours: 49 year old male with past medical history of ischemic cardiomyopathy with systolic and diastolic congestive heart failure (Echo 08/2019 EF 40-45% with diastolic dysfunction), coronary artery disease, diabetes mellitus type 2 (insluin dependent), hypertension, chronic kidney disease stage III (per history), obesity who presents to [facility] emergency department with left foot pain and purplish left fifth toe discoloration.  Patient explains that he has been experiencing an ulceration of the plantar surface of the left foot for approximately 1 and half months.  He has been following as an outpatient with podiatry and is currently seeing Dr. Samuella Cota and Dr. Ardelle Anton with podiatry.  He explains that earlier this week however, he developed a blister on the dorsal surface of his left foot.  This was associated with mild pain that he describes as "tightness" and swelling.  This pain is nonradiating, worse with weightbearing or movement of the affected extremity.  In the days that followed, this blister continuously ruptured, draining some clear what sounds to be nonpurulent drainage.  Patient's pain and redness persisted.  Patient denies associated fevers, weakness, chills or changes in appetite.  Patient was evaluated in podiatry clinic on 06/21/20 where the wounds of the left foot were debrided and patient was sent home with oral doxycycline.  Despite patient taking doxycycline he states that he woke up the morning 6/24 with his left foot, particularly the fifth digit of the left foot appearing to turn purple.  This prompted the patient to present to [facility] emergency department for evaluation.  In the emergency department, MRI of the left foot was performed revealing evidence of osteomyelitis of the phalangeal bones of the little toe with possible  osteomyelitis of the head of the fifth metatarsal.  Case was discussed with Dr. Loreta Ave who recommended the patient be placed on antibiotic therapy, n.p.o. after midnight and surgical intervention in the morning.  Patient was administered a dose of vancomycin.  The hospitalist group was then called to assess the patient for admission to the hospital.  06/23/20: Seen and examined.  POD #1 post left fifth ray amputation by Dr. Ardelle Anton due to osteomyelitis of the fifth toe.  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  Additional clinical evidence of surrounding cellulitis based on examination.  Continue intravenous ceftriaxone and vancomycin until ID and sensitivities resolved  Deep wound Gram stain, moderate gram-positive cocci, continue to follow  Deep wound culture reincubated for better growth  Continue to follow deep wound cultures  Blood cultures negative to date, continue to follow blood cultures   Appreciated podiatry's assistance.  Monitor fever curve and WBC    Cellulitis of left foot  Management per above.  Continue local wound care.    Uncontrolled type 2 diabetes mellitus with hyperglycemia and  stage 2 chronic kidney disease with long-term current use of insulin (HCC)  Longstanding known history of uncontrolled diabetes, patient  admits to only checking his blood sugars twice weekly.  Hemoglobin A1c 9.3.  Continuing home regimen of Lantus 20 units nightly  Accu-Cheks before every meal and nightly with sliding scale insulin  Will titrate on basal bolus insulin therapy based on blood sugars    Essential hypertension Blood pressure is at goal.  Continue home regimen of antihypertensive therapy    Chronic combined systolic and diastolic congestive heart failure (HCC)  No evidence of cardiogenic volume overload at this time  Euvolemic on exam  Continue home regimen of diuretics postoperatively  Continue strict I's and O's and daily weight.  Continue cardiac medications    CAD  Denies any anginal symptoms  Monitoring patient on telemetry  Continue home regimen of beta-blocker, statin therapy  Hold off aspirin due to possible debridement on Monday.    CKD (chronic kidney disease), stage II  History of chronic kidney disease stage II based on review of records however creatinine clearance today is excellent.  Currently at his baseline renal function.  Creatinine 1.1 with GFR greater than 60  Continue to avoid nephrotoxins and hypotension  Continue to monitor urine output  Monitoring renal function and electrolytes with serial chemistries.    Mixed diabetic hyperlipidemia associated with type 2 diabetes mellitus (HCC)  Continue home regimen of statin therapy   Code Status:  Full code  Family Communication:  None at bedside.  Status is: Inpatient  The patient remains inpatient appropriate and will d/c after 2 midnights.  Dispo: The patient is from: Home  Anticipated d/c is to: Home  Anticipated d/c date is: 06/26/20  Patient currently is not medically stable to d/c due to possible debridement on Monday and ongoing treatment for cellulitis with IV antibiotics.     Objective: Vitals:   06/23/20 1939 06/24/20 0011 06/24/20 0513 06/24/20  1204  BP: 119/86 121/78 106/82 112/90  Pulse: 73 82 78 78  Resp: 16 16 16 18   Temp: 98.1 F (36.7 C) 98.7 F (37.1 C) 97.6 F (36.4 C) 97.6 F (36.4 C)  TempSrc:  Oral Oral Oral  SpO2: 99% 98% 100% 99%  Weight:      Height:        Intake/Output Summary (Last 24 hours) at 06/24/2020 1236 Last data filed at 06/24/2020 0900 Gross per 24 hour  Intake 540 ml  Output 25 ml  Net 515 ml   Filed Weights   06/22/20 0728 06/23/20 1605  Weight: 124.7 kg 124.7 kg    Exam:  . General: 49 y.o. year-old male well-developed well-nourished no acute distress.  Alert oriented x3.   . Cardiovascular: Regular rate and rhythm no rubs or gallops.   [name]. Respiratory: Clear to auscultation no wheezes or rales. . Abdomen: Soft nontender normal bowel sounds present . Musculoskeletal: Trace lower extremity edema bilaterally.  Left foot in surgical dressing.   [name]. Psychiatry: Mood is appropriate for condition and setting   Data Reviewed: CBC: Recent Labs  Lab 06/22/20 0735 06/23/20 0343  WBC 14.6* 10.6*  NEUTROABS 10.4* 7.4  HGB 13.5 13.1  HCT 42.6 40.8  MCV 82.9 82.3  PLT 279 236   Basic Metabolic Panel: Recent Labs  Lab 06/22/20 0735 06/23/20 0343  NA 135 137  K 4.0 4.3  CL 102 103  CO2 20* 24  GLUCOSE 108* 123*  BUN 29* 25*  CREATININE 1.28* 1.12  CALCIUM 9.2  9.1  MG  --  2.1   GFR: Estimated Creatinine Clearance: 122 mL/min (by C-G formula based on SCr of 1.12 mg/dL). Liver Function Tests: Recent Labs  Lab 06/22/20 0735 06/23/20 0343  AST 11* 11*  ALT 13 14  ALKPHOS 50 40  BILITOT 1.1 1.0  PROT 7.5 7.2  ALBUMIN 3.6 3.1*   No results for input(s): LIPASE, AMYLASE in the last 168 hours. No results for input(s): AMMONIA in the last 168 hours. Coagulation Profile: Recent Labs  Lab 06/23/20 0343  INR 1.2   Cardiac Enzymes: No results for input(s): CKTOTAL, CKMB, CKMBINDEX, TROPONINI in the last 168 hours. BNP (last 3 results) No results for input(s): PROBNP in the  last 8760 hours. HbA1C: Recent Labs    06/22/20 0749  HGBA1C 9.3*   CBG: Recent Labs  Lab 06/23/20 1117 06/23/20 1618 06/23/20 1802 06/23/20 2135 06/24/20 0744  GLUCAP 125* 103* 106* 201* 144*   Lipid Profile: No results for input(s): CHOL, HDL, LDLCALC, TRIG, CHOLHDL, LDLDIRECT in the last 72 hours. Thyroid Function Tests: No results for input(s): TSH, T4TOTAL, FREET4, T3FREE, THYROIDAB in the last 72 hours. Anemia Panel: No results for input(s): VITAMINB12, FOLATE, FERRITIN, TIBC, IRON, RETICCTPCT in the last 72 hours. Urine analysis:    Component Value Date/Time   COLORURINE AMBER (A) 10/22/2016 2327   APPEARANCEUR CLEAR 10/22/2016 2327   LABSPEC 1.037 (H) 10/22/2016 2327   PHURINE 6.0 10/22/2016 2327   GLUCOSEU NEGATIVE 10/22/2016 2327   HGBUR NEGATIVE 10/22/2016 2327   BILIRUBINUR SMALL (A) 10/22/2016 2327   KETONESUR NEGATIVE 10/22/2016 2327   PROTEINUR >300 (A) 10/22/2016 2327   UROBILINOGEN 0.2 12/10/2010 2012   NITRITE NEGATIVE 10/22/2016 2327   LEUKOCYTESUR NEGATIVE 10/22/2016 2327   Sepsis Labs: @LABRCNTIP (procalcitonin:4,lacticidven:4)  ) Recent Results (from the past 240 hour(s))  SARS Coronavirus 2 by RT PCR (hospital order, performed in [facility] hospital lab) Nasopharyngeal Nasopharyngeal Swab     Status: None   Collection Time: 06/22/20  8:35 PM   Specimen: Nasopharyngeal Swab  Result Value Ref Range Status   SARS Coronavirus 2 NEGATIVE NEGATIVE Final    Comment: (NOTE) SARS-CoV-2 target nucleic acids are NOT DETECTED.  The SARS-CoV-2 RNA is generally detectable in upper and lower respiratory specimens during the acute phase of infection. The lowest concentration of SARS-CoV-2 viral copies this assay can detect is 250 copies / mL. A negative result does not preclude SARS-CoV-2 infection and should not be used as the sole basis for treatment or other patient management decisions.  A negative result may occur with improper specimen collection  / handling, submission of specimen other than nasopharyngeal swab, presence of viral mutation(s) within the areas targeted by this assay, and inadequate number of viral copies (<250 copies / mL). A negative result must be combined with clinical observations, patient history, and epidemiological information.  Fact Sheet for Patients:   06/24/20  Fact Sheet for Healthcare Providers: BoilerBrush.com.cy  This test is not yet approved or  cleared by the https://pope.com/ FDA and has been authorized for detection and/or diagnosis of SARS-CoV-2 by FDA under an Emergency Use Authorization (EUA).  This EUA will remain in effect (meaning this test can be used) for the duration of the COVID-19 declaration under Section 564(b)(1) of the Act, 21 U.S.C. section 360bbb-3(b)(1), unless the authorization is terminated or revoked sooner.  Performed at [facility], [address], [place], [place]   Culture, blood (routine x 2)     Status:  None (Preliminary result)   Collection Time: 06/22/20  9:25 PM   Specimen: BLOOD  Result Value Ref Range Status   Specimen Description BLOOD LEFT ANTECUBITAL  Final   Special Requests   Final    BOTTLES DRAWN AEROBIC AND ANAEROBIC Blood Culture adequate volume   Culture   Final    NO GROWTH 2 DAYS Performed at [facility], [name], [place], [place] [address]    Report Status PENDING  Incomplete  Culture, blood (routine x 2)     Status: None (Preliminary result)   Collection Time: 06/22/20  9:30 PM   Specimen: BLOOD LEFT HAND  Result Value Ref Range Status   Specimen Description BLOOD LEFT HAND  Final   Special Requests   Final    BOTTLES DRAWN AEROBIC AND ANAEROBIC Blood Culture adequate volume   Culture   Final    NO GROWTH 2 DAYS Performed at [facility], [place] [address] [place], [place] [address]    Report Status PENDING  Incomplete  Surgical PCR screen      Status: None   Collection Time: 06/23/20  5:40 AM   Specimen: Nasal Mucosa; Nasal Swab  Result Value Ref Range Status   MRSA, PCR NEGATIVE NEGATIVE Final   Staphylococcus aureus NEGATIVE NEGATIVE Final    Comment: (NOTE) The Xpert SA Assay (FDA approved for NASAL specimens in patients 7 years of age and older), is one component of a comprehensive surveillance program. It is not intended to diagnose infection nor to guide or monitor treatment. Performed at [facility], [place] [name], [place], [place] [address]   Aerobic/Anaerobic Culture (surgical/deep wound)     Status: None (Preliminary result)   Collection Time: 06/23/20  5:33 PM   Specimen: Wound  Result Value Ref Range Status   Specimen Description WOUND LEFT FOOT  Final   Special Requests AMPUTATION RAY 5TH  Final   Gram Stain   Final    ABUNDANT WBC PRESENT, PREDOMINANTLY PMN MODERATE GRAM POSITIVE COCCI    Culture   Final    CULTURE REINCUBATED FOR BETTER GROWTH Performed at [facility], [place] [address], [place], [place] [address]    Report Status PENDING  Incomplete      Studies: DG Foot 2 Views Left  Result Date: 06/23/2020 CLINICAL DATA:  Fifth digit amputation, postoperative evaluation EXAM: LEFT FOOT - 2 VIEW COMPARISON:  06/22/2020 FINDINGS: Frontal and lateral views of the left foot demonstrate interval amputation at the level of the fifth metatarsal diaphysis. Tarsal changes in the overlying soft tissues. No other acute or destructive bony abnormalities. IMPRESSION: 1. 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 Electronically Signed   By: Randa Ngo M.D.   On: 06/23/2020 19:22    Scheduled Meds: . atorvastatin  80 mg Oral QPM  . carvedilol  6.25 mg Oral BID WC  . digoxin  0.125 mg Oral Daily  . furosemide  40 mg Oral Daily  . insulin aspart  0-15 Units Subcutaneous TID AC & HS  . insulin glargine  20 Units Subcutaneous QPM  . loratadine  10 mg Oral Daily  .  pantoprazole  40 mg Oral Daily  . sacubitril-valsartan  1 tablet Oral BID  . sodium chloride flush  3 mL Intravenous Once  . spironolactone  50 mg Oral Daily    Continuous Infusions: . cefTRIAXone (ROCEPHIN)  IV 2 g (06/23/20 2129)  . vancomycin 1,000 mg (06/24/20  4709)     LOS: 1 day     Darlin Drop, MD [facility] Pager [phone]  If 7PM-7AM, please contact night-coverage [email or web address] Password [email or web address] 06/24/2020, 12:36 PM

## 2020-06-24 NOTE — Progress Notes (Signed)
Subjective: POD #1 s/p left partial 5th ray amputation, packed open. He is doing well and denies any pain. He has no concerns. Denies any systemic complaints such as fevers, chills, nausea, vomiting.   Objective: AAO x3, NAD DP/PT pulses palpable bilaterally, CRT less than 3 seconds S/P left partial 5th ray amputation with the wound open. There is granular wound and there is no purulence. There is still erythema to the dorsal lateral aspect of the foot but improved. Improved edema.  Superficial wound on the right submet 5 without any drainage, pus, ascending cellulitis.  No pain with calf compression, swelling, warmth, erythema  Assessment: POD #1 s/p left partial 5th ray amputation; stable right foot ulcer  Plan: Will plan to continue IV antibiotics, awaiting culture. Discussed likely return to the OR Monday for graft vs VAC application. WBAT in [place] wedge shoe. Elevation. Will continue to follow  Ovid Curd, DPM [phone] C: [phone]

## 2020-06-24 NOTE — Plan of Care (Signed)
  Problem: Safety: Goal: Ability to remain free from injury will improve Outcome: Progressing   

## 2020-06-24 NOTE — Op Note (Signed)
PATIENT:  [name]  49 y.o. male  PRE-OPERATIVE DIAGNOSIS:  Osteomyelitis  POST-OPERATIVE DIAGNOSIS:  Osteomyelitis  PROCEDURE:  Procedure(s): AMPUTATION RAY 5th (Left)  SURGEON:  Surgeon(s) and Role:    * Trula Slade, DPM - Primary  PHYSICIAN ASSISTANT:   ASSISTANTS: none   ANESTHESIA:   MAC  EBL:  25 mL   BLOOD ADMINISTERED:none  DRAINS: none   LOCAL MEDICATIONS USED:  None  SPECIMEN:  Source of Specimen:  bone for pathology; wound culture  DISPOSITION OF SPECIMEN:  PATHOLOGY  COUNTS:  YES  TOURNIQUET:  Applied but not inflated  DICTATION: .Sales executive  PLAN OF CARE: Admit to inpatient   PATIENT DISPOSITION:  PACU - hemodynamically stable.   Delay start of Pharmacological VTE agent (>24hrs) due to surgical blood loss or risk of bleeding: no  Indications for surgery: 49 year old male was admitted to the hospital for cellulitis, osteomyelitis of his left fifth toe.  We discussed with him limb salvage versus amputation of the toe in great detail and from multiple discussion he elects to proceed with amputation of the toe.  Discussed alternatives, risks, complications.  No promises or guarantees were given effective the procedure and all questions were answered to the best my ability.  Procedure in detail The patient was both verbally and visually identified by myself, the nursing staff, the anesthesia staff preoperatively.  Preoperatively he had a local block performed by the anesthesia staff.  He was then transferred to the operating room via stretcher and placed on the operating table in supine position.  Strength was applied to the left lower extremity but of note this was not inflated during the procedure.  The left lower extremities and scrubbed, prepped, draped in normal sterile fashion.  Timeout was performed.  This time a racquet shaped incision was planned along the distal aspect the fifth metatarsal circumferentially around  the fifth MPJ.  The incision was made with #blade scalpel from skin to bone.  At this time the fifth MPJ was identified and the toe was disarticulated and passed off the table and sent to pathology.  Soft tissue structures were freed from the distal fifth metatarsal and sagittal bone saw was utilized to resect the distal fifth metatarsal as well.  This was also sent to pathology.  There was found to be purulence noted along the fourth interspace going over to the fourth MPJ.  This was cultured today.  At this time utilize #15 by scalpel as well as a rongeur to debride all nonviable and infected tissue.  At this time further inspection revealed it appeared that the infection had actually drained and resected.  The fourth metatarsal was not exposed with a digit.  The incision was copiously irrigated with saline and hemostasis was achieved.  Proximal aspect incision was closed with 2-0 Prolene in the majority of the wound was packed with a saline wet-to-dry dressing.  Dry sterile dressing was then applied.  He was awoken from anesthesia and found to have tolerated procedure well any complications.  He was transferred to PACU vital signs stable and vascular status intact.  Postoperative course: Patient remained inpatient and IV antibiotics for now.  Await wound culture.  We will see how he does over the weekend and possibly return to the operating room on Monday for debridement,\graft application if needed versus going home with local wound care.  I discussed this with him as well as his family.

## 2020-06-25 LAB — CBC WITH DIFFERENTIAL/PLATELET
Abs Immature Granulocytes: 0.03 10*3/uL (ref 0.00–0.07)
Basophils Absolute: 0.1 10*3/uL (ref 0.0–0.1)
Basophils Relative: 1 %
Eosinophils Absolute: 0.2 10*3/uL (ref 0.0–0.5)
Eosinophils Relative: 3 %
HCT: 39.7 % (ref 39.0–52.0)
Hemoglobin: 13 g/dL (ref 13.0–17.0)
Immature Granulocytes: 0 %
Lymphocytes Relative: 26 %
Lymphs Abs: 2.2 10*3/uL (ref 0.7–4.0)
MCH: 26.8 pg (ref 26.0–34.0)
MCHC: 32.7 g/dL (ref 30.0–36.0)
MCV: 81.9 fL (ref 80.0–100.0)
Monocytes Absolute: 0.5 10*3/uL (ref 0.1–1.0)
Monocytes Relative: 6 %
Neutro Abs: 5.2 10*3/uL (ref 1.7–7.7)
Neutrophils Relative %: 64 %
Platelets: 292 10*3/uL (ref 150–400)
RBC: 4.85 MIL/uL (ref 4.22–5.81)
RDW: 13.1 % (ref 11.5–15.5)
WBC: 8.2 10*3/uL (ref 4.0–10.5)
nRBC: 0 % (ref 0.0–0.2)

## 2020-06-25 LAB — BASIC METABOLIC PANEL
Anion gap: 13 (ref 5–15)
BUN: 19 mg/dL (ref 6–20)
CO2: 25 mmol/L (ref 22–32)
Calcium: 9.1 mg/dL (ref 8.9–10.3)
Chloride: 99 mmol/L (ref 98–111)
Creatinine, Ser: 1.08 mg/dL (ref 0.61–1.24)
GFR calc Af Amer: >60 mL/min (ref >60)
GFR calc non Af Amer: >60 mL/min (ref >60)
Glucose, Bld: 152 mg/dL — ABNORMAL HIGH (ref 70–99)
Potassium: 4 mmol/L (ref 3.5–5.1)
Sodium: 137 mmol/L (ref 135–145)

## 2020-06-25 LAB — GLUCOSE, CAPILLARY
Glucose-Capillary: 145 mg/dL — ABNORMAL HIGH (ref 70–99)
Glucose-Capillary: 174 mg/dL — ABNORMAL HIGH (ref 70–99)
Glucose-Capillary: 295 mg/dL — ABNORMAL HIGH (ref 70–99)
Glucose-Capillary: 227 mg/dL — ABNORMAL HIGH (ref 70–99)

## 2020-06-25 MED ORDER — SODIUM CHLORIDE 0.9 % IV SOLN
3.0000 g | Freq: Four times a day (QID) | INTRAVENOUS | Status: DC
  Administered 2020-06-25 – 2020-06-27 (×7): 3 g via INTRAVENOUS
  Filled 2020-06-25: qty 8
  Filled 2020-06-25: qty 0.15
  Filled 2020-06-25 (×3): qty 8
  Filled 2020-06-25: qty 3
  Filled 2020-06-25: qty 8
  Filled 2020-06-25: qty 3
  Filled 2020-06-25 (×2): qty 8

## 2020-06-25 NOTE — Anesthesia Postprocedure Evaluation (Signed)
Anesthesia Post Note  Patient: [name]  Procedure(s) Performed: AMPUTATION RAY 5th (Left Foot)     Patient location during evaluation: PACU Anesthesia Type: Regional and MAC Level of consciousness: awake and alert and oriented Pain management: pain level controlled Vital Signs Assessment: post-procedure vital signs reviewed and stable Respiratory status: spontaneous breathing, nonlabored ventilation and respiratory function stable Cardiovascular status: blood pressure returned to baseline Postop Assessment: no apparent nausea or vomiting Anesthetic complications: no   No complications documented.          Brennan Bailey

## 2020-06-25 NOTE — Progress Notes (Signed)
Subjective: POD #1 s/p left partial 5th ray amputation, packed open.  Currently denies any pain and no new concerns.  No acute overnight events.  Afebrile. Denies any systemic complaints such as fevers, chills, nausea, vomiting.   Objective: AAO x3, NAD DP/PT pulses palpable bilaterally, CRT less than 3 seconds S/P left partial 5th ray amputation with the wound open. There is granular wound and there is no purulence.  Some fibrotic tissue is present.  The erythema is much improved.  Still some faint erythema. There is no drainage or pus.  There is no malodor. RIGHT foot: Superficial wound present fifth metatarsal head plantarly.  There is no edema, erythema, drainage or pus or any fluctuation crepitation  No pain with calf compression, swelling, warmth, erythema  Assessment: POD #2 s/p left partial 5th ray amputation; stable right foot ulcer  Plan: Overall infection clinically improved.  White counts normal.  Afebrile.  Discussed to return to operating tomorrow for debridement, VAC application.  Discussed possible graft as well.  We discussed the surgery the postoperative course.  We discussed risks of surgery including, but not limited to, spread of infection, need for further potation as well as general risks of surgery including stroke, heart attack, death.  We will plan for surgery tomorrow scheduled.  N.p.o. after midnight.  Scheduled for 12:15 PM.  Continue IV antibiotics.  Ovid Curd, DPM [phone] C: [phone]

## 2020-06-25 NOTE — Progress Notes (Addendum)
Pharmacy Antibiotic Note [name] is a 49 y.o. male admitted on 06/22/2020 with cellulitis / osteomyelitis.  Pharmacy has been consulted for vancomycin dosing. Patient had a debridement for his left foot on 6/23, and noticed purple of their toe and came to [facility] ED 6/24 which led to being diagnosed with osteomyelitis. Patient is POD #1 s/p left partial 5th ray amputation. Possible return to OR on Monday for graft vs VAC application. 6/25 wound culture is showing a rare staph aureus, no susceptibilities just yet. Will continue to monitor and assess a vanc trough for therapeutic evaluation.   Plan: Continue vancomycin 1 gram IV every 8 hours Vacn trough at 1730 prior to next dose at 1800 Goal: Vanc trough 15-20 mcg/mL Continue Ceftriaxone 2g IV every 24 hours per MD  --- ADDENDUM Vancomycin consult discontinued; Pharmacy consulted to start patient on Unasyn. Wound cultures for foot returned Staphylococcus aureus, resistant only to erythromycin.   UPDATED PLAN: D/c vanc, d/c vanc trough Start Unasyn 3 gram IV every 6 hours-  Height: 6' 5.99" (198.1 cm) Weight: 124.7 kg (275 lb) IBW/kg (Calculated) : 91.38  Temp (24hrs), Avg:98.6 F (37 C), Min:98.4 F (36.9 C), Max:98.7 F (37.1 C)  Recent Labs  Lab 06/22/20 0735 06/23/20 0343 06/25/20 0642  WBC 14.6* 10.6* 8.2  CREATININE 1.28* 1.12 1.08  LATICACIDVEN 0.9 1.2  --     Estimated Creatinine Clearance: 126.6 mL/min (by C-G formula based on SCr of 1.08 mg/dL).    No Known Allergies  Antimicrobials this admission: Ceftriaxone  6/24  >>  Vancomycin  6/24  >>   Dose adjustments this admission: n/a  Microbiology results: 6/24 BCx: NGTD x3 days 6/25 woundCx: staphy aureus >> r-erythromycin  6/25 MRSA PCR: sent   Thank you for the interesting consult and for involving pharmacy in this patient's care.  Bradley Ferris, PharmD, BCPS 06/25/2020 1:54 PM PGY-2 Pharmacy Administration Resident Please check [email or web address] for  unit-specific pharmacist phone numbers

## 2020-06-25 NOTE — Progress Notes (Signed)
Orthopedic Tech Progress Note Patient Details:  Rien Marland 1971-08-31 [phone] Called nurse patient already has shoe. Patient ID: Cameron Schwinn, male   DOB: 03-10-1971, 49 y.o.   MRN: [id number]   Michelle Piper 06/25/2020, 1:02 PM

## 2020-06-25 NOTE — Progress Notes (Signed)
  Subjective:  Patient ID: [name], male    DOB: 1971/09/19,  MRN: [id number]  Chief Complaint  Patient presents with  . Wound Check    Pt states some recent blood drainage, denies fever/chills/nausea/vomiting.   49 y.o. male presents for wound care. Hx confirmed with patient.  Objective:  Physical Exam: Wound Location: left 5th MPJ Wound Measurement: 1.5x1.5 Wound Base: Granular/Healthy Peri-wound: Calloused Exudate: Scant/small amount Serosanguinous exudate No warmth, erythema, signs of acute infection.  Assessment:   1. Diabetic ulcer of left midfoot associated with diabetes mellitus due to underlying condition, limited to breakdown of skin [facility])    Plan:  Patient was evaluated and treated and all questions answered.  Ulcer Left 5th Met -Wound stable without signs of acute infection. -Wound gently debrided. Non-procedural -Continue offloading shoe. -F/u in 2 weeks for recheck.  No follow-ups on file.

## 2020-06-25 NOTE — Progress Notes (Signed)
PROGRESS NOTE  [name] CVE:[id number] DOB: 03/12/1971 DOA: 06/22/2020 PCP: Hoy Register, MD  HPI/Recap of past 10 hours: 49 year old male with past medical history of ischemic cardiomyopathy with systolic and diastolic congestive heart failure (Echo 08/2019 EF 40-45% with diastolic dysfunction), coronary artery disease, diabetes mellitus type 2 (insluin dependent), hypertension, chronic kidney disease stage III (per history), obesity who presents to [facility] emergency department with left foot pain and purplish left fifth toe discoloration.  Patient explains that he has been experiencing an ulceration of the plantar surface of the left foot for approximately 1 and half months.  He has been following as an outpatient with podiatry and is currently seeing Dr. Samuella Cota and Dr. Ardelle Anton with podiatry.  He explains that earlier this week however, he developed a blister on the dorsal surface of his left foot.  This was associated with mild pain that he describes as "tightness" and swelling.  This pain is nonradiating, worse with weightbearing or movement of the affected extremity.  In the days that followed, this blister continuously ruptured, draining some clear what sounds to be nonpurulent drainage.  Patient's pain and redness persisted.  Patient denies associated fevers, weakness, chills or changes in appetite.  Patient was evaluated in podiatry clinic on 06/21/20 where the wounds of the left foot were debrided and patient was sent home with oral doxycycline.  Despite patient taking doxycycline he states that he woke up the morning 6/24 with his left foot, particularly the fifth digit of the left foot appearing to turn purple.  This prompted the patient to present to [facility] emergency department for evaluation.  In the emergency department, MRI of the left foot was performed revealing evidence of osteomyelitis of the phalangeal bones of the little toe with possible  osteomyelitis of the head of the fifth metatarsal.  Case was discussed with Dr. Loreta Ave who recommended the patient be placed on antibiotic therapy, n.p.o. after midnight and surgical intervention in the morning.  Patient was administered a dose of vancomycin.  The hospitalist group was then called to assess the patient for admission to the hospital.  06/25/20: Seen and examined.  POD #2 post left fifth ray amputation by Dr. Ardelle Anton due to osteomyelitis.  Deep wound culture growing staph aureus, sensitivities showing resistance to erythromycin.  MRSA screen negative.  Stop Rocephin and IV vancomycin and switch to Unasyn.  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  Additional clinical evidence of surrounding cellulitis based on examination.  Deep wound culture growing staph aureus, sensitivities showing resistance to erythromycin.  MRSA screen negative.   Completed 3 days of intravenous ceftriaxone and vancomycin   Switched to IV Unasyn on 06/25/2020.  Blood cultures negative to date, continue to follow blood cultures   Possible return to the OR tomorrow 06/26/2000 for graft versus VAC application  Appreciated podiatry's assistance.  Currently afebrile with no leukocytosis and no neutrophilia.    Cellulitis of left foot  Management per  above.  Continue Unasyn    Uncontrolled type 2 diabetes mellitus with hyperglycemia and stage 2 chronic kidney disease with long-term current use of insulin (HCC)  Longstanding known history of uncontrolled diabetes, patient admits to only checking his blood sugars twice weekly.  Hemoglobin A1c 9.3.  Continuing home regimen of Lantus 20 units nightly  Accu-Cheks before every meal and nightly with sliding scale insulin  Will titrate on basal bolus insulin therapy based on blood sugars    Essential hypertension Blood pressure is at goal.  Continue home regimen of antihypertensive therapy    Chronic combined systolic and diastolic congestive heart failure (HCC)  No evidence of cardiogenic volume overload at this time  Euvolemic on exam  Continue home regimen of diuretics postoperatively  Continue strict I's and O's and daily weight.  Continue cardiac medications    CAD  Denies any anginal symptoms  Monitoring patient on telemetry  Continue home regimen of beta-blocker, statin therapy  Continue to hold off aspirin due to possible debridement on Monday.    CKD (chronic kidney disease), stage II  History of chronic kidney disease stage II based on review of records however creatinine clearance today is excellent.  Currently at his baseline renal function.  Creatinine 1.0 with GFR greater than 60  Continue to avoid nephrotoxins and hypotension  Continue to monitor urine output  Monitoring renal function and electrolytes with serial chemistries.    Mixed diabetic hyperlipidemia associated with type 2 diabetes mellitus (HCC)  Continue home regimen of statin therapy   Code Status:  Full code  Family Communication:  None at bedside.  Status is: Inpatient  The patient remains inpatient appropriate and will d/c after 2 midnights.  Dispo: The patient is from: Home  Anticipated d/c is to: Home  Anticipated d/c date is:  06/26/20  Patient currently is not medically stable to d/c due to possible debridement on Monday and ongoing treatment for cellulitis with IV antibiotics.     Objective: Vitals:   06/24/20 1204 06/24/20 1939 06/25/20 0430 06/25/20 0839  BP: 112/90 112/88 123/80   Pulse: 78 83 82 84  Resp: 18 16 16    Temp: 97.6 F (36.4 C) 98.4 F (36.9 C) 98.7 F (37.1 C)   TempSrc: Oral Oral    SpO2: 99% 99% 99%   Weight:      Height:        Intake/Output Summary (Last 24 hours) at 06/25/2020 1243 Last data filed at 06/25/2020 1100 Gross per 24 hour  Intake 960 ml  Output 1 ml  Net 959 ml   Filed Weights   06/22/20 0728 06/23/20 1605  Weight: 124.7 kg 124.7 kg    Exam:  . General: 49 y.o. year-old male well-developed well-nourished in no acute distress.  Alert and oriented x3.   . Cardiovascular: Regular rate and rhythm no rubs or gallops. 49 Respiratory: Clear to auscultation no wheezes or rales. . Abdomen: Soft nontender normal bowel sounds present.   . Musculoskeletal: Trace lower extremity edema bilaterally.  Left foot in surgical dressing. [name] Psychiatry: Mood is appropriate for condition and setting.  Data Reviewed: CBC: Recent Labs  Lab 06/22/20 0735 06/23/20 0343 06/25/20 0642  WBC 14.6* 10.6* 8.2  NEUTROABS 10.4* 7.4 5.2  HGB 13.5 13.1 13.0  HCT 42.6 40.8 39.7  MCV 82.9 82.3 81.9  PLT 279 236 292   Basic Metabolic Panel:  Recent Labs  Lab 06/22/20 0735 06/23/20 0343 06/25/20 0642  NA 135 137 137  K 4.0 4.3 4.0  CL 102 103 99  CO2 20* 24 25  GLUCOSE 108* 123* 152*  BUN 29* 25* 19  CREATININE 1.28* 1.12 1.08  CALCIUM 9.2 9.1 9.1  MG  --  2.1  --    GFR: Estimated Creatinine Clearance: 126.6 mL/min (by C-G formula based on SCr of 1.08 mg/dL). Liver Function Tests: Recent Labs  Lab 06/22/20 0735 06/23/20 0343  AST 11* 11*  ALT 13 14  ALKPHOS 50 40  BILITOT 1.1 1.0  PROT 7.5 7.2  ALBUMIN 3.6 3.1*   No results for input(s): LIPASE,  AMYLASE in the last 168 hours. No results for input(s): AMMONIA in the last 168 hours. Coagulation Profile: Recent Labs  Lab 06/23/20 0343  INR 1.2   Cardiac Enzymes: No results for input(s): CKTOTAL, CKMB, CKMBINDEX, TROPONINI in the last 168 hours. BNP (last 3 results) No results for input(s): PROBNP in the last 8760 hours. HbA1C: No results for input(s): HGBA1C in the last 72 hours. CBG: Recent Labs  Lab 06/24/20 1202 06/24/20 1633 06/24/20 2053 06/25/20 0803 06/25/20 1152  GLUCAP 188* 193* 203* 145* 174*   Lipid Profile: No results for input(s): CHOL, HDL, LDLCALC, TRIG, CHOLHDL, LDLDIRECT in the last 72 hours. Thyroid Function Tests: No results for input(s): TSH, T4TOTAL, FREET4, T3FREE, THYROIDAB in the last 72 hours. Anemia Panel: No results for input(s): VITAMINB12, FOLATE, FERRITIN, TIBC, IRON, RETICCTPCT in the last 72 hours. Urine analysis:    Component Value Date/Time   COLORURINE AMBER (A) 10/22/2016 2327   APPEARANCEUR CLEAR 10/22/2016 2327   LABSPEC 1.037 (H) 10/22/2016 2327   PHURINE 6.0 10/22/2016 2327   GLUCOSEU NEGATIVE 10/22/2016 2327   HGBUR NEGATIVE 10/22/2016 2327   BILIRUBINUR SMALL (A) 10/22/2016 2327   KETONESUR NEGATIVE 10/22/2016 2327   PROTEINUR >300 (A) 10/22/2016 2327   UROBILINOGEN 0.2 12/10/2010 2012   NITRITE NEGATIVE 10/22/2016 2327   LEUKOCYTESUR NEGATIVE 10/22/2016 2327   Sepsis Labs: @LABRCNTIP (procalcitonin:4,lacticidven:4)  ) Recent Results (from the past 240 hour(s))  SARS Coronavirus 2 by RT PCR (hospital order, performed in [facility] hospital lab) Nasopharyngeal Nasopharyngeal Swab     Status: None   Collection Time: 06/22/20  8:35 PM   Specimen: Nasopharyngeal Swab  Result Value Ref Range Status   SARS Coronavirus 2 NEGATIVE NEGATIVE Final    Comment: (NOTE) SARS-CoV-2 target nucleic acids are NOT DETECTED.  The SARS-CoV-2 RNA is generally detectable in upper and lower respiratory specimens during the acute  phase of infection. The lowest concentration of SARS-CoV-2 viral copies this assay can detect is 250 copies / mL. A negative result does not preclude SARS-CoV-2 infection and should not be used as the sole basis for treatment or other patient management decisions.  A negative result may occur with improper specimen collection / handling, submission of specimen other than nasopharyngeal swab, presence of viral mutation(s) within the areas targeted by this assay, and inadequate number of viral copies (<250 copies / mL). A negative result must be combined with clinical observations, patient history, and epidemiological information.  Fact Sheet for Patients:   BoilerBrush.com.cyhttps://www.fda.gov/media/136312/download  Fact Sheet for Healthcare Providers: https://pope.com/https://www.fda.gov/media/136313/download  This test is not yet approved or  cleared by the [place] FDA and has been authorized for detection and/or diagnosis of SARS-CoV-2 by FDA under an Emergency Use Authorization (EUA).  This EUA will remain in effect (meaning this test can be used) for  the duration of the COVID-19 declaration under Section 564(b)(1) of the Act, 21 U.S.C. section 360bbb-3(b)(1), unless the authorization is terminated or revoked sooner.  Performed at [facility], [address], [place], [place] [address]   Culture, blood (routine x 2)     Status: None (Preliminary result)   Collection Time: 06/22/20  9:25 PM   Specimen: BLOOD  Result Value Ref Range Status   Specimen Description BLOOD LEFT ANTECUBITAL  Final   Special Requests   Final    BOTTLES DRAWN AEROBIC AND ANAEROBIC Blood Culture adequate volume   Culture   Final    NO GROWTH 3 DAYS Performed at [facility], [address], [place], [place] [address]    Report Status PENDING  Incomplete  Culture, blood (routine x 2)     Status: None (Preliminary result)   Collection Time: 06/22/20  9:30 PM   Specimen: BLOOD LEFT HAND  Result Value Ref Range  Status   Specimen Description BLOOD LEFT HAND  Final   Special Requests   Final    BOTTLES DRAWN AEROBIC AND ANAEROBIC Blood Culture adequate volume   Culture   Final    NO GROWTH 3 DAYS Performed at [facility], [address], [place], [place] [address]    Report Status PENDING  Incomplete  Surgical PCR screen     Status: None   Collection Time: 06/23/20  5:40 AM   Specimen: Nasal Mucosa; Nasal Swab  Result Value Ref Range Status   MRSA, PCR NEGATIVE NEGATIVE Final   Staphylococcus aureus NEGATIVE NEGATIVE Final    Comment: (NOTE) The Xpert SA Assay (FDA approved for NASAL specimens in patients 87 years of age and older), is one component of a comprehensive surveillance program. It is not intended to diagnose infection nor to guide or monitor treatment. Performed at [facility], [address], [place], [place] [address]   Aerobic/Anaerobic Culture (surgical/deep wound)     Status: None (Preliminary result)   Collection Time: 06/23/20  5:33 PM   Specimen: Wound  Result Value Ref Range Status   Specimen Description WOUND LEFT FOOT  Final   Special Requests AMPUTATION RAY 5TH  Final   Gram Stain   Final    ABUNDANT WBC PRESENT, PREDOMINANTLY PMN MODERATE GRAM POSITIVE COCCI Performed at [facility], [address] [place], [place] [address]    Culture   Final    RARE STAPHYLOCOCCUS AUREUS CULTURE REINCUBATED FOR BETTER GROWTH NO ANAEROBES ISOLATED; CULTURE IN PROGRESS FOR 5 DAYS    Report Status PENDING  Incomplete   Organism ID, Bacteria STAPHYLOCOCCUS AUREUS  Final      Susceptibility   Staphylococcus aureus - MIC*    CIPROFLOXACIN <=0.5 SENSITIVE Sensitive     ERYTHROMYCIN >=8 RESISTANT Resistant     GENTAMICIN <=0.5 SENSITIVE Sensitive     OXACILLIN <=0.25 SENSITIVE Sensitive     TETRACYCLINE <=1 SENSITIVE Sensitive     VANCOMYCIN 1 SENSITIVE Sensitive     TRIMETH/SULFA <=10 SENSITIVE Sensitive     CLINDAMYCIN <=0.25 SENSITIVE Sensitive      RIFAMPIN <=0.5 SENSITIVE Sensitive     Inducible Clindamycin NEGATIVE Sensitive     * RARE STAPHYLOCOCCUS AUREUS      Studies: No results found.  Scheduled Meds: . atorvastatin  80 mg Oral QPM  . carvedilol  6.25 mg Oral BID WC  . digoxin  0.125 mg Oral Daily  . furosemide  40 mg Oral  Daily  . insulin aspart  0-15 Units Subcutaneous TID AC & HS  . insulin glargine  20 Units Subcutaneous QPM  . loratadine  10 mg Oral Daily  . pantoprazole  40 mg Oral Daily  . sacubitril-valsartan  1 tablet Oral BID  . sodium chloride flush  3 mL Intravenous Once  . spironolactone  50 mg Oral Daily    Continuous Infusions: . cefTRIAXone (ROCEPHIN)  IV 2 g (06/24/20 2159)  . vancomycin 1,000 mg (06/25/20 0843)     LOS: 2 days     Kayleen Memos, MD [facility] Pager [phone]  If 7PM-7AM, please contact night-coverage [email or web address] Password [facility] 06/25/2020, 12:43 PM

## 2020-06-25 NOTE — Plan of Care (Signed)
°  Problem: Education: °Goal: Knowledge of General Education information will improve °Description: Including pain rating scale, medication(s)/side effects and non-pharmacologic comfort measures °Outcome: Progressing °  °Problem: Health Behavior/Discharge Planning: °Goal: Ability to manage health-related needs will improve °Outcome: Progressing °  °Problem: Clinical Measurements: °Goal: Ability to maintain clinical measurements within normal limits will improve °Outcome: Progressing °Goal: Will remain free from infection °Outcome: Progressing °Goal: Diagnostic test results will improve °Outcome: Progressing °Goal: Respiratory complications will improve °Outcome: Progressing °Goal: Cardiovascular complication will be avoided °Outcome: Progressing °  °Problem: Activity: °Goal: Risk for activity intolerance will decrease °Outcome: Progressing °  °Problem: Nutrition: °Goal: Adequate nutrition will be maintained °Outcome: Progressing °  °Problem: Coping: °Goal: Level of anxiety will decrease °Outcome: Progressing °  °Problem: Elimination: °Goal: Will not experience complications related to bowel motility °Outcome: Progressing °Goal: Will not experience complications related to urinary retention °Outcome: Progressing °  °Problem: Pain Managment: °Goal: General experience of comfort will improve °Outcome: Progressing °  °Problem: Safety: °Goal: Ability to remain free from injury will improve °Outcome: Progressing °  °Problem: Skin Integrity: °Goal: Risk for impaired skin integrity will decrease °Outcome: Progressing °  °Problem: Education: °Goal: Ability to describe self-care measures that may prevent or decrease complications (Diabetes Survival Skills Education) will improve °Outcome: Progressing °Goal: Individualized Educational Video(s) °Outcome: Progressing °  °Problem: Coping: °Goal: Ability to adjust to condition or change in health will improve °Outcome: Progressing °  °Problem: Fluid Volume: °Goal: Ability to  maintain a balanced intake and output will improve °Outcome: Progressing °  °Problem: Health Behavior/Discharge Planning: °Goal: Ability to identify and utilize available resources and services will improve °Outcome: Progressing °Goal: Ability to manage health-related needs will improve °Outcome: Progressing °  °Problem: Metabolic: °Goal: Ability to maintain appropriate glucose levels will improve °Outcome: Progressing °  °Problem: Nutritional: °Goal: Maintenance of adequate nutrition will improve °Outcome: Progressing °Goal: Progress toward achieving an optimal weight will improve °Outcome: Progressing °  °Problem: Skin Integrity: °Goal: Risk for impaired skin integrity will decrease °Outcome: Progressing °  °Problem: Tissue Perfusion: °Goal: Adequacy of tissue perfusion will improve °Outcome: Progressing °  °Problem: Clinical Measurements: °Goal: Ability to avoid or minimize complications of infection will improve °Outcome: Progressing °  °Problem: Skin Integrity: °Goal: Skin integrity will improve °Outcome: Progressing °  °

## 2020-06-26 ENCOUNTER — Inpatient Hospital Stay (HOSPITAL_COMMUNITY): Payer: Self-pay | Admitting: Anesthesiology

## 2020-06-26 ENCOUNTER — Encounter (HOSPITAL_COMMUNITY)
Admission: RE | Disposition: A | Discharge status: Home / Self Care | Payer: Self-pay | Source: Home / Self Care | Attending: Internal Medicine

## 2020-06-26 ENCOUNTER — Encounter (HOSPITAL_COMMUNITY): Payer: Self-pay | Admitting: Podiatry

## 2020-06-26 HISTORY — PX: WOUND DEBRIDEMENT: SHX247

## 2020-06-26 LAB — GLUCOSE, CAPILLARY
Glucose-Capillary: 154 mg/dL — ABNORMAL HIGH (ref 70–99)
Glucose-Capillary: 153 mg/dL — ABNORMAL HIGH (ref 70–99)
Glucose-Capillary: 152 mg/dL — ABNORMAL HIGH (ref 70–99)
Glucose-Capillary: 131 mg/dL — ABNORMAL HIGH (ref 70–99)
Glucose-Capillary: 186 mg/dL — ABNORMAL HIGH (ref 70–99)
Glucose-Capillary: 228 mg/dL — ABNORMAL HIGH (ref 70–99)

## 2020-06-26 LAB — BASIC METABOLIC PANEL
Anion gap: 11 (ref 5–15)
BUN: 20 mg/dL (ref 6–20)
CO2: 25 mmol/L (ref 22–32)
Calcium: 9.2 mg/dL (ref 8.9–10.3)
Chloride: 101 mmol/L (ref 98–111)
Creatinine, Ser: 1.15 mg/dL (ref 0.61–1.24)
GFR calc Af Amer: >60 mL/min (ref >60)
GFR calc non Af Amer: >60 mL/min (ref >60)
Glucose, Bld: 178 mg/dL — ABNORMAL HIGH (ref 70–99)
Potassium: 4 mmol/L (ref 3.5–5.1)
Sodium: 137 mmol/L (ref 135–145)

## 2020-06-26 LAB — CBC WITH DIFFERENTIAL/PLATELET
Abs Immature Granulocytes: 0.04 10*3/uL (ref 0.00–0.07)
Basophils Absolute: 0.1 10*3/uL (ref 0.0–0.1)
Basophils Relative: 1 %
Eosinophils Absolute: 0.2 10*3/uL (ref 0.0–0.5)
Eosinophils Relative: 3 %
HCT: 40.1 % (ref 39.0–52.0)
Hemoglobin: 13 g/dL (ref 13.0–17.0)
Immature Granulocytes: 1 %
Lymphocytes Relative: 33 %
Lymphs Abs: 2.8 10*3/uL (ref 0.7–4.0)
MCH: 26.9 pg (ref 26.0–34.0)
MCHC: 32.4 g/dL (ref 30.0–36.0)
MCV: 83 fL (ref 80.0–100.0)
Monocytes Absolute: 0.7 10*3/uL (ref 0.1–1.0)
Monocytes Relative: 8 %
Neutro Abs: 4.7 10*3/uL (ref 1.7–7.7)
Neutrophils Relative %: 54 %
Platelets: 271 10*3/uL (ref 150–400)
RBC: 4.83 MIL/uL (ref 4.22–5.81)
RDW: 13.1 % (ref 11.5–15.5)
WBC: 8.6 10*3/uL (ref 4.0–10.5)
nRBC: 0 % (ref 0.0–0.2)

## 2020-06-26 SURGERY — DEBRIDEMENT, WOUND
Anesthesia: Monitor Anesthesia Care | Laterality: Left

## 2020-06-26 MED ORDER — BUPIVACAINE HCL (PF) 0.5 % IJ SOLN
INTRAMUSCULAR | Status: AC
  Filled 2020-06-26: qty 30

## 2020-06-26 MED ORDER — MEPERIDINE HCL 25 MG/ML IJ SOLN: 6.2500 mg | INTRAMUSCULAR | Status: DC | PRN

## 2020-06-26 MED ORDER — 0.9 % SODIUM CHLORIDE (POUR BTL) OPTIME
TOPICAL | Status: DC | PRN
  Administered 2020-06-26: 1000 mL

## 2020-06-26 MED ORDER — CHLORHEXIDINE GLUCONATE CLOTH 2 % EX PADS: 6.0000 | MEDICATED_PAD | Freq: Once | CUTANEOUS | Status: DC

## 2020-06-26 MED ORDER — FENTANYL CITRATE (PF) 250 MCG/5ML IJ SOLN
INTRAMUSCULAR | Status: AC
  Filled 2020-06-26: qty 5

## 2020-06-26 MED ORDER — BUPIVACAINE HCL 0.5 % IJ SOLN
INTRAMUSCULAR | Status: DC | PRN
  Administered 2020-06-26: 10 mL

## 2020-06-26 MED ORDER — PROPOFOL 10 MG/ML IV BOLUS
INTRAVENOUS | Status: DC | PRN
  Administered 2020-06-26: 30 mg via INTRAVENOUS

## 2020-06-26 MED ORDER — MIDAZOLAM HCL 2 MG/2ML IJ SOLN
INTRAMUSCULAR | Status: AC
  Filled 2020-06-26: qty 2

## 2020-06-26 MED ORDER — LACTATED RINGERS IV SOLN: INTRAVENOUS | Status: DC

## 2020-06-26 MED ORDER — HYDROMORPHONE HCL 1 MG/ML IJ SOLN: 0.2500 mg | INTRAMUSCULAR | Status: DC | PRN

## 2020-06-26 MED ORDER — LIDOCAINE HCL (PF) 1 % IJ SOLN
INTRAMUSCULAR | Status: AC
  Filled 2020-06-26: qty 30

## 2020-06-26 MED ORDER — CHLORHEXIDINE GLUCONATE 0.12 % MT SOLN
OROMUCOSAL | Status: AC
  Administered 2020-06-26: 15 mL via OROMUCOSAL
  Filled 2020-06-26: qty 15

## 2020-06-26 MED ORDER — SACCHAROMYCES BOULARDII 250 MG PO CAPS
250.0000 mg | ORAL_CAPSULE | Freq: Two times a day (BID) | ORAL | Status: DC
  Administered 2020-06-26 – 2020-06-27 (×2): 250 mg via ORAL
  Filled 2020-06-26 (×2): qty 1

## 2020-06-26 MED ORDER — MIDAZOLAM HCL 5 MG/5ML IJ SOLN
INTRAMUSCULAR | Status: DC | PRN
  Administered 2020-06-26: 2 mg via INTRAVENOUS

## 2020-06-26 MED ORDER — LIDOCAINE 1 % OPTIME INJ - NO CHARGE
INTRAMUSCULAR | Status: DC | PRN
  Administered 2020-06-26: 10 mL

## 2020-06-26 MED ORDER — SODIUM CHLORIDE 0.9 % IR SOLN
Status: DC | PRN
  Administered 2020-06-26: 1000 mL

## 2020-06-26 MED ORDER — LACTATED RINGERS IV SOLN: INTRAVENOUS | Status: DC | PRN

## 2020-06-26 MED ORDER — CHLORHEXIDINE GLUCONATE 0.12 % MT SOLN: 15.0000 mL | Freq: Once | OROMUCOSAL | Status: AC

## 2020-06-26 MED ORDER — CHLORHEXIDINE GLUCONATE CLOTH 2 % EX PADS
6.0000 | MEDICATED_PAD | Freq: Once | CUTANEOUS | Status: AC
  Administered 2020-06-26: 6 via TOPICAL

## 2020-06-26 MED ORDER — FENTANYL CITRATE (PF) 100 MCG/2ML IJ SOLN
INTRAMUSCULAR | Status: DC | PRN
  Administered 2020-06-26: 100 ug via INTRAVENOUS

## 2020-06-26 MED ORDER — PROMETHAZINE HCL 25 MG/ML IJ SOLN: 6.2500 mg | INTRAMUSCULAR | Status: DC | PRN

## 2020-06-26 MED ORDER — PROPOFOL 10 MG/ML IV BOLUS
INTRAVENOUS | Status: AC
  Filled 2020-06-26: qty 20

## 2020-06-26 MED ORDER — KETOROLAC TROMETHAMINE 30 MG/ML IJ SOLN: 30.0000 mg | Freq: Once | INTRAMUSCULAR | Status: DC | PRN

## 2020-06-26 SURGICAL SUPPLY — 67 items
BALL CTTN LRG ABS STRL LF (GAUZE/BANDAGES/DRESSINGS) ×1
BANDAGE ESMARK 6X9 LF (GAUZE/BANDAGES/DRESSINGS) IMPLANT
BLADE AVERAGE 25MMX9MM (BLADE)
BLADE AVERAGE 25X9 (BLADE) IMPLANT
BLADE SURG 10 STRL SS (BLADE) IMPLANT
BNDG CMPR 9X6 STRL LF SNTH (GAUZE/BANDAGES/DRESSINGS)
BNDG COHESIVE 4X5 TAN STRL (GAUZE/BANDAGES/DRESSINGS) ×3 IMPLANT
BNDG ELASTIC 6X5.8 VLCR STR LF (GAUZE/BANDAGES/DRESSINGS) ×3 IMPLANT
BNDG ESMARK 6X9 LF (GAUZE/BANDAGES/DRESSINGS)
BNDG GAUZE ELAST 4 BULKY (GAUZE/BANDAGES/DRESSINGS) ×3 IMPLANT
COTTONBALL LRG STERILE PKG (GAUZE/BANDAGES/DRESSINGS) ×3 IMPLANT
COVER MAYO STAND STRL (DRAPES) ×3 IMPLANT
CUFF TOURN SGL QUICK 34 (TOURNIQUET CUFF)
CUFF TOURN SGL QUICK 42 (TOURNIQUET CUFF) IMPLANT
CUFF TRNQT CYL 34X4.125X (TOURNIQUET CUFF) IMPLANT
DECANTER SPIKE VIAL GLASS SM (MISCELLANEOUS) ×3 IMPLANT
DRAPE C-ARM MINI (DRAPES) IMPLANT
DRAPE U-SHAPE 47X51 STRL (DRAPES) ×6 IMPLANT
DRSG ADAPTIC 3X8 NADH LF (GAUZE/BANDAGES/DRESSINGS) ×3 IMPLANT
DRSG PAD ABDOMINAL 8X10 ST (GAUZE/BANDAGES/DRESSINGS) ×3 IMPLANT
DURAPREP 26ML APPLICATOR (WOUND CARE) IMPLANT
ELECT REM PT RETURN 9FT ADLT (ELECTROSURGICAL) ×3
ELECTRODE REM PT RTRN 9FT ADLT (ELECTROSURGICAL) ×1 IMPLANT
GAUZE SPONGE 4X4 12PLY STRL (GAUZE/BANDAGES/DRESSINGS) ×3 IMPLANT
GAUZE XEROFORM 5X9 LF (GAUZE/BANDAGES/DRESSINGS) ×3 IMPLANT
GLOVE BIO SURGEON STRL SZ 6.5 (GLOVE) ×2 IMPLANT
GLOVE BIO SURGEON STRL SZ8 (GLOVE) ×6 IMPLANT
GLOVE BIO SURGEONS STRL SZ 6.5 (GLOVE) ×1
GLOVE BIOGEL PI IND STRL 8 (GLOVE) ×1 IMPLANT
GLOVE BIOGEL PI IND STRL 9 (GLOVE) ×1 IMPLANT
GLOVE BIOGEL PI INDICATOR 8 (GLOVE) ×2
GLOVE BIOGEL PI INDICATOR 9 (GLOVE) ×2
GOWN STRL REUS W/ TWL LRG LVL3 (GOWN DISPOSABLE) ×1 IMPLANT
GOWN STRL REUS W/ TWL XL LVL3 (GOWN DISPOSABLE) ×1 IMPLANT
GOWN STRL REUS W/TWL LRG LVL3 (GOWN DISPOSABLE) ×3
GOWN STRL REUS W/TWL XL LVL3 (GOWN DISPOSABLE) ×3
KIT BASIN OR (CUSTOM PROCEDURE TRAY) ×3 IMPLANT
KIT TURNOVER KIT B (KITS) ×3 IMPLANT
MATRIX WOUND MESHED 2X2 (Tissue) ×1 IMPLANT
MICROMATRIX 500MG (Tissue) ×3 IMPLANT
NEEDLE 18GX1X1/2 (RX/OR ONLY) (NEEDLE) ×3 IMPLANT
NEEDLE 25GX 5/8IN NON SAFETY (NEEDLE) ×3 IMPLANT
NEEDLE HYPO 25GX1X1/2 BEV (NEEDLE) ×3 IMPLANT
NS IRRIG 1000ML POUR BTL (IV SOLUTION) ×3 IMPLANT
PACK ORTHO EXTREMITY (CUSTOM PROCEDURE TRAY) ×3 IMPLANT
PAD ARMBOARD 7.5X6 YLW CONV (MISCELLANEOUS) ×6 IMPLANT
PAD CAST 4YDX4 CTTN HI CHSV (CAST SUPPLIES) ×1 IMPLANT
PADDING CAST COTTON 4X4 STRL (CAST SUPPLIES) ×3
PROBE DEBRIDE SONICVAC MISONIX (TIP) ×3 IMPLANT
SOLUTION PARTIC MCRMTRX 500MG (Tissue) ×1 IMPLANT
SPONGE LAP 18X18 RF (DISPOSABLE) ×6 IMPLANT
STAPLER VISISTAT 35W (STAPLE) ×3 IMPLANT
STOCKINETTE IMPERVIOUS LG (DRAPES) IMPLANT
SUT ETHILON 2 0 PSLX (SUTURE) IMPLANT
SUT PROLENE 3 0 PS 2 (SUTURE) IMPLANT
SWAB COLLECTION DEVICE MRSA (MISCELLANEOUS) IMPLANT
SWAB CULTURE ESWAB REG 1ML (MISCELLANEOUS) IMPLANT
SYR 20ML LL LF (SYRINGE) ×3 IMPLANT
SYR CONTROL 10ML LL (SYRINGE) ×3 IMPLANT
TOWEL GREEN STERILE (TOWEL DISPOSABLE) ×3 IMPLANT
TOWEL GREEN STERILE FF (TOWEL DISPOSABLE) ×3 IMPLANT
TUBE CONNECTING 12'X1/4 (SUCTIONS) ×1
TUBE CONNECTING 12X1/4 (SUCTIONS) ×2 IMPLANT
TUBE IRRIGATION SET MISONIX (TUBING) ×3 IMPLANT
WATER STERILE IRR 1000ML POUR (IV SOLUTION) ×3 IMPLANT
WOUND MATRIX MESHED 2X2 (Tissue) ×2 IMPLANT
YANKAUER SUCT BULB TIP NO VENT (SUCTIONS) ×3 IMPLANT

## 2020-06-26 NOTE — Progress Notes (Signed)
Report called to short stay, Susan, RN. 

## 2020-06-26 NOTE — Transfer of Care (Signed)
Immediate Anesthesia Transfer of Care Note  Patient: [name]  Procedure(s) Performed: LEFT FOOT WOUND DEBRIDEMENT AND GRAFT APPLICATION (Left )  Patient Location: PACU  Anesthesia Type:MAC  Level of Consciousness: awake, alert  and oriented  Airway & Oxygen Therapy: Patient Spontanous Breathing  Post-op Assessment: Report given to RN and Post -op Vital signs reviewed and stable  Post vital signs: Reviewed and stable  Last Vitals:  Vitals Value Taken Time  BP 120/86 06/26/20 1340  Temp    Pulse 73 06/26/20 1341  Resp 18 06/26/20 1341  SpO2 98 % 06/26/20 1341  Vitals shown include unvalidated device data.  Last Pain:  Vitals:   06/26/20 0759  TempSrc: Oral  PainSc:          Complications: No complications documented.

## 2020-06-26 NOTE — Progress Notes (Signed)
Seen in preop. Scheduled for wound debridement, VAC vs graft. He has no further questions or concerns. Will plan for surgery as planned. Surgical consent signed. NPO.

## 2020-06-26 NOTE — Anesthesia Preprocedure Evaluation (Signed)
Anesthesia Evaluation  Patient identified by MRN, date of birth, ID band Patient awake    Reviewed: Allergy & Precautions, NPO status , Patient's Chart, lab work & pertinent test results, reviewed documented beta blocker date and time   History of Anesthesia Complications Negative for: history of anesthetic complications  Airway Mallampati: II  TM Distance: >3 FB Neck ROM: Full    Dental  (+) Missing, Poor Dentition,    Pulmonary neg pulmonary ROS,    Pulmonary exam normal        Cardiovascular hypertension, Pt. on medications and Pt. on home beta blockers + CAD, + Past MI (2012) and +CHF  Normal cardiovascular exam  TTe 08/2019: EF 40-45%, impaired relaxation, valves ok   Neuro/Psych negative neurological ROS  negative psych ROS   GI/Hepatic Neg liver ROS, GERD  Medicated and Controlled,  Endo/Other  diabetes, Type 2, Insulin Dependent, Oral Hypoglycemic Agents  Renal/GU Renal InsufficiencyRenal disease  negative genitourinary   Musculoskeletal negative musculoskeletal ROS (+)   Abdominal (+) + obese,   Peds  Hematology negative hematology ROS (+)   Anesthesia Other Findings Day of surgery medications reviewed with patient.  Reproductive/Obstetrics negative OB ROS                             Anesthesia Physical  Anesthesia Plan  ASA: III  Anesthesia Plan: MAC and Regional   Post-op Pain Management:    Induction:   PONV Risk Score and Plan: 1 and Treatment may vary due to age or medical condition, Propofol infusion, Ondansetron and Midazolam  Airway Management Planned: Natural Airway and Simple Face Mask  Additional Equipment: None  Intra-op Plan:   Post-operative Plan:   Informed Consent: I have reviewed the patients History and Physical, chart, labs and discussed the procedure including the risks, benefits and alternatives for the proposed anesthesia with the patient or  authorized representative who has indicated his/her understanding and acceptance.       Plan Discussed with: CRNA  Anesthesia Plan Comments:         Anesthesia Quick Evaluation

## 2020-06-26 NOTE — Progress Notes (Signed)
PROGRESS NOTE  [name] PJK:[id number] DOB: [birth year] DOA: 06/22/2020 PCP: Hoy Register, MD  HPI/Recap of past 74 hours: 49 year old male with past medical history of ischemic cardiomyopathy with systolic and diastolic congestive heart failure (Echo 08/2019 EF 40-45% with diastolic dysfunction), coronary artery disease, diabetes mellitus type 2 (insluin dependent), hypertension, chronic kidney disease stage III (per history), obesity who presents to [facility] emergency department with left foot pain and purplish left fifth toe discoloration.  Patient explains that he has been experiencing an ulceration of the plantar surface of the left foot for approximately 1 and half months.  He has been following as an outpatient with podiatry and is currently seeing Dr. Samuella Cota and Dr. Ardelle Anton with podiatry.  He explains that earlier this week however, he developed a blister on the dorsal surface of his left foot.  This was associated with mild pain that he describes as "tightness" and swelling.  This pain is nonradiating, worse with weightbearing or movement of the affected extremity.  In the days that followed, this blister continuously ruptured, draining some clear what sounds to be nonpurulent drainage.  Patient's pain and redness persisted.  Patient denies associated fevers, weakness, chills or changes in appetite.  Patient was evaluated in podiatry clinic on 06/21/20 where the wounds of the left foot were debrided and patient was sent home with oral doxycycline.  Despite patient taking doxycycline he states that he woke up the morning 6/24 with his left foot, particularly the fifth digit of the left foot appearing to turn purple.  This prompted the patient to present to [facility] emergency department for evaluation.  In the emergency department, MRI of the left foot was performed revealing evidence of osteomyelitis of the phalangeal bones of the little toe with possible  osteomyelitis of the head of the fifth metatarsal.  Case was discussed with Dr. Loreta Ave who recommended the patient be placed on antibiotic therapy, n.p.o. after midnight and surgical intervention in the morning.  Patient was administered a dose of vancomycin.  The hospitalist group was then called to assess the patient for admission to the hospital.  06/25/20: Seen and examined.  POD #3 post left fifth ray amputation by Dr. Ardelle Anton due to osteomyelitis.  Deep wound culture growing staph aureus, sensitivities showing resistance to erythromycin.  MRSA screen negative.  On Unasyn.  Scheduled for wound debridement, VAC versus graft.  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  Additional clinical evidence of surrounding cellulitis based on examination.  Deep wound culture growing staph aureus, sensitivities showing resistance to erythromycin.  MRSA screen negative.   Completed 3 days of intravenous ceftriaxone and vancomycin on 06/25/2020.  Switched to IV Unasyn on 06/25/2020.  Blood cultures negative to date, continue to follow blood cultures   Return to the OR 06/26/2000 for graft versus VAC application  Appreciated  podiatry's assistance.  Currently afebrile with no leukocytosis and no neutrophilia.    Cellulitis of left  foot  Management per above.  Continue Unasyn    Uncontrolled type 2 diabetes mellitus with hyperglycemia and stage 2 chronic kidney disease with long-term current use of insulin (HCC)  Longstanding known history of uncontrolled diabetes, patient admits to only checking his blood sugars twice weekly.  Hemoglobin A1c 9.3.  Continuing home regimen of Lantus 20 units nightly  Accu-Cheks before every meal and nightly with sliding scale insulin  Will titrate on basal bolus insulin therapy based on blood sugars    Essential hypertension Blood pressure is at goal.  Continue home regimen of antihypertensive therapy    Chronic combined systolic and diastolic congestive heart failure (HCC)  No evidence of cardiogenic volume overload at this time  Euvolemic on exam  Continue home regimen of diuretics postoperatively  Continue strict I's and O's and daily weight.  Continue cardiac medications    CAD  Denies any anginal symptoms  Monitoring patient on telemetry  Continue home regimen of beta-blocker, statin therapy  Continue to hold off aspirin due to possible debridement on Monday.    CKD (chronic kidney disease), stage II  History of chronic kidney disease stage II based on review of records however creatinine clearance today is excellent.  Currently at his baseline renal function.  Creatinine 1.0 with GFR greater than 60  Continue to avoid nephrotoxins and hypotension  Continue to monitor urine output  Monitoring renal function and electrolytes with serial chemistries.    Mixed diabetic hyperlipidemia associated with type 2 diabetes mellitus (HCC)  Continue home regimen of statin therapy   Code Status:  Full code  Family Communication:  None at bedside.  Status is: Inpatient  The patient remains inpatient appropriate and will d/c after 2 midnights.  Dispo: The patient is from: Home  Anticipated d/c is to: Home   Anticipated d/c date is: 06/27/20  Patient currently is not medically stable to d/c due to planned debridement and ongoing treatment for cellulitis with IV antibiotics.     Objective: Vitals:   06/25/20 2014 06/26/20 0403 06/26/20 0759 06/26/20 1154  BP: (!) 123/93 (!) 101/57 (!) 127/96   Pulse: 84 80 83   Resp: 16 15    Temp: 97.6 F (36.4 C) 98.1 F (36.7 C) 98.3 F (36.8 C)   TempSrc: Oral Oral Oral   SpO2: 97% 96% 98%   Weight:    124.7 kg  Height:    6' 5.99" (1.981 m)    Intake/Output Summary (Last 24 hours) at 06/26/2020 1304 Last data filed at 06/26/2020 0500 Gross per 24 hour  Intake 341.72 ml  Output 1600 ml  Net -1258.28 ml   Filed Weights   06/22/20 0728 06/23/20 1605 06/26/20 1154  Weight: 124.7 kg 124.7 kg 124.7 kg    Exam: Physical exam essentially unchanged from previous exam.   General: 49 y.o. year-old male well-developed well-nourished in no acute distress.  Alert and oriented x3.    Cardiovascular: Regular rate and rhythm no rubs or gallops.  Respiratory: Clear to auscultation no wheezes or rales.  Abdomen: Soft nontender normal bowel sounds present.    Musculoskeletal: No lower extremity edema bilaterally.  Left foot in surgical dressing.  Psychiatry: Mood is appropriate for condition and setting.  Data Reviewed: CBC: Recent Labs  Lab 06/22/20 0735 06/23/20 0343 06/25/20 0642 06/26/20 0503  WBC 14.6* 10.6* 8.2 8.6  NEUTROABS 10.4* 7.4 5.2 4.7  HGB  13.5 13.1 13.0 13.0  HCT 42.6 40.8 39.7 40.1  MCV 82.9 82.3 81.9 83.0  PLT 279 236 292 271   Basic Metabolic Panel: Recent Labs  Lab 06/22/20 0735 06/23/20 0343 06/25/20 0642 06/26/20 0503  NA 135 137 137 137  K 4.0 4.3 4.0 4.0  CL 102 103 99 101  CO2 20* 24 25 25   GLUCOSE 108* 123* 152* 178*  BUN 29* 25* 19 20  CREATININE 1.28* 1.12 1.08 1.15  CALCIUM 9.2 9.1 9.1 9.2  MG  --  2.1  --   --    GFR: Estimated Creatinine Clearance: 118.9 mL/min (by C-G formula  based on SCr of 1.15 mg/dL). Liver Function Tests: Recent Labs  Lab 06/22/20 0735 06/23/20 0343  AST 11* 11*  ALT 13 14  ALKPHOS 50 40  BILITOT 1.1 1.0  PROT 7.5 7.2  ALBUMIN 3.6 3.1*   No results for input(s): LIPASE, AMYLASE in the last 168 hours. No results for input(s): AMMONIA in the last 168 hours. Coagulation Profile: Recent Labs  Lab 06/23/20 0343  INR 1.2   Cardiac Enzymes: No results for input(s): CKTOTAL, CKMB, CKMBINDEX, TROPONINI in the last 168 hours. BNP (last 3 results) No results for input(s): PROBNP in the last 8760 hours. HbA1C: No results for input(s): HGBA1C in the last 72 hours. CBG: Recent Labs  Lab 06/25/20 1601 06/25/20 2045 06/26/20 0802 06/26/20 1133 06/26/20 1247  GLUCAP 295* 227* 154* 153* 152*   Lipid Profile: No results for input(s): CHOL, HDL, LDLCALC, TRIG, CHOLHDL, LDLDIRECT in the last 72 hours. Thyroid Function Tests: No results for input(s): TSH, T4TOTAL, FREET4, T3FREE, THYROIDAB in the last 72 hours. Anemia Panel: No results for input(s): VITAMINB12, FOLATE, FERRITIN, TIBC, IRON, RETICCTPCT in the last 72 hours. Urine analysis:    Component Value Date/Time   COLORURINE AMBER (A) 10/22/2016 2327   APPEARANCEUR CLEAR 10/22/2016 2327   LABSPEC 1.037 (H) 10/22/2016 2327   PHURINE 6.0 10/22/2016 2327   GLUCOSEU NEGATIVE 10/22/2016 2327   HGBUR NEGATIVE 10/22/2016 2327   BILIRUBINUR SMALL (A) 10/22/2016 2327   KETONESUR NEGATIVE 10/22/2016 2327   PROTEINUR >300 (A) 10/22/2016 2327   UROBILINOGEN 0.2 12/10/2010 2012   NITRITE NEGATIVE 10/22/2016 2327   LEUKOCYTESUR NEGATIVE 10/22/2016 2327   Sepsis Labs: @LABRCNTIP (procalcitonin:4,lacticidven:4)  ) Recent Results (from the past 240 hour(s))  SARS Coronavirus 2 by RT PCR (hospital order, performed in [facility] hospital lab) Nasopharyngeal Nasopharyngeal Swab     Status: None   Collection Time: 06/22/20  8:35 PM   Specimen: Nasopharyngeal Swab  Result Value Ref Range  Status   SARS Coronavirus 2 NEGATIVE NEGATIVE Final    Comment: (NOTE) SARS-CoV-2 target nucleic acids are NOT DETECTED.  The SARS-CoV-2 RNA is generally detectable in upper and lower respiratory specimens during the acute phase of infection. The lowest concentration of SARS-CoV-2 viral copies this assay can detect is 250 copies / mL. A negative result does not preclude SARS-CoV-2 infection and should not be used as the sole basis for treatment or other patient management decisions.  A negative result may occur with improper specimen collection / handling, submission of specimen other than nasopharyngeal swab, presence of viral mutation(s) within the areas targeted by this assay, and inadequate number of viral copies (<250 copies / mL). A negative result must be combined with clinical observations, patient history, and epidemiological information.  Fact Sheet for Patients:   [facility]  Fact Sheet for Healthcare Providers: 06/24/20  This test is not yet approved  or  cleared by the [place] FDA and has been authorized for detection and/or diagnosis of SARS-CoV-2 by FDA under an Emergency Use Authorization (EUA).  This EUA will remain in effect (meaning this test can be used) for the duration of the COVID-19 declaration under Section 564(b)(1) of the Act, 21 U.S.C. section 360bbb-3(b)(1), unless the authorization is terminated or revoked sooner.  Performed at [facility], [address] [place], [place] [address]   Culture, blood (routine x 2)     Status: None (Preliminary result)   Collection Time: 06/22/20  9:25 PM   Specimen: BLOOD  Result Value Ref Range Status   Specimen Description BLOOD LEFT ANTECUBITAL  Final   Special Requests   Final    BOTTLES DRAWN AEROBIC AND ANAEROBIC Blood Culture adequate volume   Culture   Final    NO GROWTH 3 DAYS Performed at [facility], [address] [place], [place] [address]    Report Status PENDING  Incomplete  Culture, blood (routine x 2)     Status: None (Preliminary result)   Collection Time: 06/22/20  9:30 PM   Specimen: BLOOD LEFT HAND  Result Value Ref Range Status   Specimen Description BLOOD LEFT HAND  Final   Special Requests   Final    BOTTLES DRAWN AEROBIC AND ANAEROBIC Blood Culture adequate volume   Culture   Final    NO GROWTH 3 DAYS Performed at [facility], [address] [place], [place] [address]    Report Status PENDING  Incomplete  Surgical PCR screen     Status: None   Collection Time: 06/23/20  5:40 AM   Specimen: Nasal Mucosa; Nasal Swab  Result Value Ref Range Status   MRSA, PCR NEGATIVE NEGATIVE Final   Staphylococcus aureus NEGATIVE NEGATIVE Final    Comment: (NOTE) The Xpert SA Assay (FDA approved for NASAL specimens in patients 47 years of age and older), is one component of a comprehensive surveillance program. It is not intended to diagnose infection nor to guide or monitor treatment. Performed at [facility], [address] [place], [place] [address]   Aerobic/Anaerobic Culture (surgical/deep wound)     Status: None (Preliminary result)   Collection Time: 06/23/20  5:33 PM   Specimen: Wound  Result Value Ref Range Status   Specimen Description WOUND LEFT FOOT  Final   Special Requests AMPUTATION RAY 5TH  Final   Gram Stain   Final    ABUNDANT WBC PRESENT, PREDOMINANTLY PMN MODERATE GRAM POSITIVE COCCI Performed at [facility], [address] [place], [place] [address]    Culture   Final    RARE STAPHYLOCOCCUS AUREUS CULTURE REINCUBATED FOR BETTER GROWTH NO ANAEROBES ISOLATED; CULTURE IN PROGRESS FOR 5 DAYS    Report Status PENDING  Incomplete   Organism ID, Bacteria STAPHYLOCOCCUS AUREUS  Final      Susceptibility   Staphylococcus aureus - MIC*    CIPROFLOXACIN <=0.5 SENSITIVE Sensitive     ERYTHROMYCIN >=8 RESISTANT Resistant     GENTAMICIN <=0.5  SENSITIVE Sensitive     OXACILLIN <=0.25 SENSITIVE Sensitive     TETRACYCLINE <=1 SENSITIVE Sensitive     VANCOMYCIN 1 SENSITIVE Sensitive     TRIMETH/SULFA <=10 SENSITIVE Sensitive     CLINDAMYCIN <=0.25 SENSITIVE Sensitive     RIFAMPIN <=0.5 SENSITIVE Sensitive     Inducible Clindamycin NEGATIVE Sensitive     * RARE STAPHYLOCOCCUS AUREUS  Studies: No results found.  Scheduled Meds:  [MAR Hold] atorvastatin  80 mg Oral QPM   [MAR Hold] carvedilol  6.25 mg Oral BID WC   Chlorhexidine Gluconate Cloth  6 each Topical Once   [MAR Hold] digoxin  0.125 mg Oral Daily   [MAR Hold] furosemide  40 mg Oral Daily   [MAR Hold] insulin aspart  0-15 Units Subcutaneous TID AC & HS   [MAR Hold] insulin glargine  20 Units Subcutaneous QPM   [MAR Hold] loratadine  10 mg Oral Daily   [MAR Hold] pantoprazole  40 mg Oral Daily   [MAR Hold] saccharomyces boulardii  250 mg Oral BID   [MAR Hold] sacubitril-valsartan  1 tablet Oral BID   [MAR Hold] sodium chloride flush  3 mL Intravenous Once   [MAR Hold] spironolactone  50 mg Oral Daily    Continuous Infusions:  [MAR Hold] ampicillin-sulbactam (UNASYN) IV 3 g (06/26/20 0825)   lactated ringers 10 mL/hr at 06/26/20 1204     LOS: 3 days     Kayleen Memos, MD [facility] Pager [phone]  If 7PM-7AM, please contact night-coverage [email or web address] Password [facility] 06/26/2020, 1:04 PM

## 2020-06-26 NOTE — Anesthesia Postprocedure Evaluation (Signed)
Anesthesia Post Note  Patient: [name]  Procedure(s) Performed: LEFT FOOT WOUND DEBRIDEMENT AND GRAFT APPLICATION (Left )     Patient location during evaluation: PACU Anesthesia Type: Regional and MAC Level of consciousness: awake Pain management: pain level controlled Vital Signs Assessment: post-procedure vital signs reviewed and stable Respiratory status: spontaneous breathing Cardiovascular status: stable Postop Assessment: no apparent nausea or vomiting Anesthetic complications: no   No complications documented.  Last Vitals:  Vitals:   06/26/20 1400 06/26/20 1415  BP: (!) 112/91 125/90  Pulse: 79 77  Resp: (!) 22 18  Temp: 36.9 C 36.6 C  SpO2: 100% 99%    Last Pain:  Vitals:   06/26/20 1415  TempSrc:   PainSc: 0-No pain                 Huston Foley

## 2020-06-26 NOTE — Plan of Care (Signed)
°  Problem: Education: °Goal: Knowledge of General Education information will improve °Description: Including pain rating scale, medication(s)/side effects and non-pharmacologic comfort measures °Outcome: Progressing °  °Problem: Health Behavior/Discharge Planning: °Goal: Ability to manage health-related needs will improve °Outcome: Progressing °  °Problem: Clinical Measurements: °Goal: Ability to maintain clinical measurements within normal limits will improve °Outcome: Progressing °Goal: Will remain free from infection °Outcome: Progressing °Goal: Diagnostic test results will improve °Outcome: Progressing °Goal: Respiratory complications will improve °Outcome: Progressing °Goal: Cardiovascular complication will be avoided °Outcome: Progressing °  °Problem: Activity: °Goal: Risk for activity intolerance will decrease °Outcome: Progressing °  °Problem: Nutrition: °Goal: Adequate nutrition will be maintained °Outcome: Progressing °  °Problem: Coping: °Goal: Level of anxiety will decrease °Outcome: Progressing °  °Problem: Elimination: °Goal: Will not experience complications related to bowel motility °Outcome: Progressing °Goal: Will not experience complications related to urinary retention °Outcome: Progressing °  °Problem: Pain Managment: °Goal: General experience of comfort will improve °Outcome: Progressing °  °Problem: Safety: °Goal: Ability to remain free from injury will improve °Outcome: Progressing °  °Problem: Skin Integrity: °Goal: Risk for impaired skin integrity will decrease °Outcome: Progressing °  °Problem: Education: °Goal: Ability to describe self-care measures that may prevent or decrease complications (Diabetes Survival Skills Education) will improve °Outcome: Progressing °Goal: Individualized Educational Video(s) °Outcome: Progressing °  °Problem: Coping: °Goal: Ability to adjust to condition or change in health will improve °Outcome: Progressing °  °Problem: Fluid Volume: °Goal: Ability to  maintain a balanced intake and output will improve °Outcome: Progressing °  °Problem: Health Behavior/Discharge Planning: °Goal: Ability to identify and utilize available resources and services will improve °Outcome: Progressing °Goal: Ability to manage health-related needs will improve °Outcome: Progressing °  °Problem: Metabolic: °Goal: Ability to maintain appropriate glucose levels will improve °Outcome: Progressing °  °Problem: Nutritional: °Goal: Maintenance of adequate nutrition will improve °Outcome: Progressing °Goal: Progress toward achieving an optimal weight will improve °Outcome: Progressing °  °Problem: Skin Integrity: °Goal: Risk for impaired skin integrity will decrease °Outcome: Progressing °  °Problem: Tissue Perfusion: °Goal: Adequacy of tissue perfusion will improve °Outcome: Progressing °  °Problem: Clinical Measurements: °Goal: Ability to avoid or minimize complications of infection will improve °Outcome: Progressing °  °Problem: Skin Integrity: °Goal: Skin integrity will improve °Outcome: Progressing °  °

## 2020-06-26 NOTE — Brief Op Note (Signed)
06/26/2020  1:29 PM  PATIENT:  [name]  49 y.o. male  PRE-OPERATIVE DIAGNOSIS:  ulcer  POST-OPERATIVE DIAGNOSIS:  ulcer  PROCEDURE:  Procedure(s): DEBRIDEMENT RAY AMPUTATION (Left)  SURGEON:  Surgeon(s) and Role:    Trula Slade, DPM - Primary  PHYSICIAN ASSISTANT:   ASSISTANTS: none   ANESTHESIA:   MAC  EBL:  Minimal    BLOOD ADMINISTERED:none  DRAINS: none   LOCAL MEDICATIONS USED:  OTHER 20 cc lidocaine and marcaine plain  SPECIMEN:  No Specimen  DISPOSITION OF SPECIMEN:  N/A  COUNTS:  YES  TOURNIQUET:  * Missing tourniquet times found for documented tourniquets in log: [id number] *  DICTATION: .Viviann Spare Dictation  PLAN OF CARE: Admit to inpatient   PATIENT DISPOSITION:  PACU - hemodynamically stable.   Delay start of Pharmacological VTE agent (>24hrs) due to surgical blood loss or risk of bleeding: no

## 2020-06-26 NOTE — Progress Notes (Signed)
Called floor RN, informed of unreleased surgical orders to be completed before transfer to surgery.

## 2020-06-26 NOTE — Evaluation (Signed)
Physical Therapy Evaluation and Discharge Patient Details Name: [name] MRN: [id number] DOB: 01-23-1971 Today's Date: 06/26/2020   History of Present Illness  Pt is 49 yo male with h/o DM, CHF, CKI stage III, obesity who presents with nonhealing wound L 5th ray. Underwent L 5th ray amputation followed by I&D and wound vac placement.   Clinical Impression  Patient evaluated by Physical Therapy with no further acute PT needs identified. All education has been completed and the patient has no further questions. Pt independent with mobility though current post op shoe does not appear to be keeping full wt off L lateral foot. Practiced shortening step length to better do this. Pt adamant that he will not use an assistive device to limit weight bearing. Discussed activity level with frequent elevation.  See below for any follow-up Physical Therapy or equipment needs. PT is signing off. Thank you for this referral.     Follow Up Recommendations No PT follow up    Equipment Recommendations  None recommended by PT    Recommendations for Other Services       Precautions / Restrictions Precautions Precautions: None Required Braces or Orthoses: Other Brace Other Brace: L post op shoe Restrictions Weight Bearing Restrictions: Yes LLE Weight Bearing: Partial weight bearing LLE Partial Weight Bearing Percentage or Pounds: heel only      Mobility  Bed Mobility Overal bed mobility: Independent                Transfers Overall transfer level: Modified independent Equipment used: None             General transfer comment: pt stood safely EOB with post op shoe. Shoe is not a Darco so is protective but does not seem to be keeping wt completely in the heel  Ambulation/Gait Ambulation/Gait assistance: Modified independent (Device/Increase time) Gait Distance (Feet): 35 Feet Assistive device: None;Rolling walker (2 wheeled) Gait Pattern/deviations: Step-to  pattern;Antalgic Gait velocity: WFL Gait velocity interpretation: >4.37 ft/sec, indicative of normal walking speed General Gait Details: pt safe ambulating without AD but therapist not assured that he's not putting wt through L lateral foot. Had him practice a NWB pattern with RW in case he has to be NWB at any point. Practiced shortening stride length to prevent toe off on L  Stairs            Wheelchair Mobility    Modified Rankin (Stroke Patients Only)       Balance Overall balance assessment: Modified Independent                                           Pertinent Vitals/Pain Pain Assessment: No/denies pain    Home Living Family/patient expects to be discharged to:: Private residence Living Arrangements: Non-relatives/Friends Available Help at Discharge: Family;Available 24 hours/day Type of Home: House Home Access: Level entry     Home Layout: One level Home Equipment: None Additional Comments: pt has a roommate but father coming to stay with him a few days.     Prior Function Level of Independence: Independent         Comments: works full time, on his feet and driving a lot     Hand Dominance        Extremity/Trunk Assessment   Upper Extremity Assessment Upper Extremity Assessment: Overall WFL for tasks assessed    Lower Extremity Assessment Lower Extremity Assessment:  Overall [facility] for tasks assessed    Cervical / Trunk Assessment Cervical / Trunk Assessment: Normal  Communication   Communication: No difficulties  Cognition Arousal/Alertness: Awake/alert Behavior During Therapy: WFL for tasks assessed/performed Overall Cognitive Status: Within Functional Limits for tasks assessed                                 General Comments: pt WFL but very adamant that he will not use at assistive device and that he will return to work next week      General Comments General comments (skin integrity, edema, etc.):  VSS, transport present end of session to take pt to I&D and wound vac placement    Exercises     Assessment/Plan    PT Assessment Patent does not need any further PT services  PT Problem List         PT Treatment Interventions      PT Goals (Current goals can be found in the Care Plan section)  Acute Rehab PT Goals Patient Stated Goal: return to work PT Goal Formulation: All assessment and education complete, DC therapy    Frequency     Barriers to discharge        [name]-evaluation               AM-PAC PT "6 Clicks" Mobility  Outcome Measure Help needed turning from your back to your side while in a flat bed without using bedrails?: None Help needed moving from lying on your back to sitting on the side of a flat bed without using bedrails?: None Help needed moving to and from a bed to a chair (including a wheelchair)?: None Help needed standing up from a chair using your arms (e.g., wheelchair or bedside chair)?: None Help needed to walk in hospital room?: None Help needed climbing 3-5 steps with a railing? : None 6 Click Score: 24    End of Session Equipment Utilized During Treatment: Other (comment) (post op shoe) Activity Tolerance: Patient tolerated treatment well Patient left: in bed;with call bell/phone within reach Nurse Communication: Mobility status PT Visit Diagnosis: Unsteadiness on feet (R26.81)    Time: 1119-1140 PT Time Calculation (min) (ACUTE ONLY): 21 min   Charges:   PT Evaluation $PT Eval Low Complexity: 1 Low          [name], PT  Acute Rehab Services  Pager [phone] Office [phone]   [name] 06/26/2020, 2:12 PM

## 2020-06-26 NOTE — Progress Notes (Signed)
Inpatient Diabetes Program Recommendations  AACE/ADA: New Consensus Statement on Inpatient Glycemic Control (2015)  Target Ranges:  Prepandial:   less than 140 mg/dL      Peak postprandial:   less than 180 mg/dL (1-2 hours)      Critically ill patients:  140 - 180 mg/dL   Lab Results  Component Value Date   GLUCAP 152 (H) 06/26/2020   HGBA1C 9.3 (H) 06/22/2020    Review of Glycemic Control Results for [name], [name] (MRN [id number]) as of 06/26/2020 13:23  Ref. Range 06/25/2020 16:01 06/25/2020 20:45 06/26/2020 08:02 06/26/2020 11:33 06/26/2020 12:47  Glucose-Capillary Latest Ref Range: 70 - 99 mg/dL 233 (H) 612 (H) 244 (H) 153 (H) 152 (H)   Diabetes history: DM 2 Outpatient Diabetes medications: Glucotrol 10 mg bid, Lantus 20 units q PM Current orders for Inpatient glycemic control:  Lantus 20 units q HS Novolog moderate tid with meals and HS Inpatient Diabetes Program Recommendations:   Agree with current orders.  Consider adding Novolog 3 units tid with meals (hold if patient eats less than 50% or NPO).   Thanks  Beryl Meager, RN, BC-ADM Inpatient Diabetes Coordinator Pager [phone] (8a-5p)

## 2020-06-26 NOTE — Anesthesia Procedure Notes (Signed)
Procedure Name: MAC Date/Time: 06/26/2020 1:00 PM Performed by: Eligha Bridegroom, CRNA Pre-anesthesia Checklist: Patient identified, Emergency Drugs available, Suction available, Patient being monitored and Timeout performed Patient Re-evaluated:Patient Re-evaluated prior to induction Oxygen Delivery Method: Nasal cannula Preoxygenation: Pre-oxygenation with 100% oxygen Induction Type: IV induction

## 2020-06-27 ENCOUNTER — Encounter (HOSPITAL_COMMUNITY): Payer: Self-pay | Admitting: Podiatry

## 2020-06-27 LAB — CBC WITH DIFFERENTIAL/PLATELET
Abs Immature Granulocytes: 0.04 10*3/uL (ref 0.00–0.07)
Basophils Absolute: 0.1 10*3/uL (ref 0.0–0.1)
Basophils Relative: 1 %
Eosinophils Absolute: 0.3 10*3/uL (ref 0.0–0.5)
Eosinophils Relative: 3 %
HCT: 39.2 % (ref 39.0–52.0)
Hemoglobin: 12.7 g/dL — ABNORMAL LOW (ref 13.0–17.0)
Immature Granulocytes: 1 %
Lymphocytes Relative: 31 %
Lymphs Abs: 2.7 10*3/uL (ref 0.7–4.0)
MCH: 26.8 pg (ref 26.0–34.0)
MCHC: 32.4 g/dL (ref 30.0–36.0)
MCV: 82.9 fL (ref 80.0–100.0)
Monocytes Absolute: 0.8 10*3/uL (ref 0.1–1.0)
Monocytes Relative: 9 %
Neutro Abs: 4.9 10*3/uL (ref 1.7–7.7)
Neutrophils Relative %: 55 %
Platelets: 290 10*3/uL (ref 150–400)
RBC: 4.73 MIL/uL (ref 4.22–5.81)
RDW: 12.8 % (ref 11.5–15.5)
WBC: 8.8 10*3/uL (ref 4.0–10.5)
nRBC: 0 % (ref 0.0–0.2)

## 2020-06-27 LAB — CULTURE, BLOOD (ROUTINE X 2)
Culture: NO GROWTH
Culture: NO GROWTH
Special Requests: ADEQUATE
Special Requests: ADEQUATE

## 2020-06-27 LAB — BASIC METABOLIC PANEL
Anion gap: 8 (ref 5–15)
BUN: 21 mg/dL — ABNORMAL HIGH (ref 6–20)
CO2: 28 mmol/L (ref 22–32)
Calcium: 9.1 mg/dL (ref 8.9–10.3)
Chloride: 103 mmol/L (ref 98–111)
Creatinine, Ser: 1.25 mg/dL — ABNORMAL HIGH (ref 0.61–1.24)
GFR calc Af Amer: >60 mL/min (ref >60)
GFR calc non Af Amer: >60 mL/min (ref >60)
Glucose, Bld: 201 mg/dL — ABNORMAL HIGH (ref 70–99)
Potassium: 4.3 mmol/L (ref 3.5–5.1)
Sodium: 139 mmol/L (ref 135–145)

## 2020-06-27 LAB — SURGICAL PATHOLOGY

## 2020-06-27 LAB — GLUCOSE, CAPILLARY: Glucose-Capillary: 178 mg/dL — ABNORMAL HIGH (ref 70–99)

## 2020-06-27 MED ORDER — AMOXICILLIN-POT CLAVULANATE 875-125 MG PO TABS: 1.0000 | ORAL_TABLET | Freq: Two times a day (BID) | ORAL | 0 refills | Status: DC

## 2020-06-27 MED ORDER — SACCHAROMYCES BOULARDII 250 MG PO CAPS: 250.0000 mg | ORAL_CAPSULE | Freq: Two times a day (BID) | ORAL | 0 refills | Status: AC

## 2020-06-27 MED ORDER — POLYETHYLENE GLYCOL 3350 17 G PO PACK: 17.0000 g | PACK | Freq: Every day | ORAL | 0 refills | Status: DC | PRN

## 2020-06-27 MED ORDER — INSULIN ASPART 100 UNIT/ML ~~LOC~~ SOLN
3.0000 [IU] | Freq: Three times a day (TID) | SUBCUTANEOUS | Status: DC
  Administered 2020-06-27: 3 [IU] via SUBCUTANEOUS

## 2020-06-27 MED ORDER — AMOXICILLIN-POT CLAVULANATE 875-125 MG PO TABS: 1.0000 | ORAL_TABLET | Freq: Two times a day (BID) | ORAL | Status: DC

## 2020-06-27 MED FILL — AMOX-CLAV 875-125 MG TABLET: 875-125 | 14 days supply | Qty: 28 | Fill #0

## 2020-06-27 NOTE — Progress Notes (Signed)
[name], [name] ([id number]) Visit Report for 02/01/2020 Arrival Information Details Patient Name: Date of Service: [name] [place], [name] RO LD 02/01/2020 9:45 A M Medical Record Number: [id number] Patient Account Number: [id number] Date of Birth/Sex: Treating RN: 09-17-1971 (49 y.o. [name] Primary Care Eulanda Dorion: Hoy Register Other Clinician: Referring Wilmetta Speiser: Treating Jadine Brumley/Extender: Josephine Cables Weeks in Treatment: 3 Visit Information History Since Last Visit Added or deleted any medications: No Patient Arrived: Ambulatory Any new allergies or adverse reactions: No Arrival Time: 10:26 Had a fall or experienced change in No Accompanied By: self activities of daily living that may affect Transfer Assistance: None risk of falls: Patient Identification Verified: Yes Signs or symptoms of abuse/neglect since No Secondary Verification Process Completed: Yes last visito Patient Requires Transmission-Based Precautions: No Hospitalized since last visit: No Patient Has Alerts: No Implantable device outside of the clinic No excluding cellular tissue based products placed in the center since last visit: Has Dressing in Place as Prescribed: Yes Has Footwear/Offloading in Place as Yes Prescribed: Left: Surgical Shoe with Pressure Relief Insole Pain Present Now: No Electronic Signature(s) Signed: 02/01/2020 4:57:44 PM By: Cherylin Mylar Entered By: Cherylin Mylar on 02/01/2020 10:27:04 -------------------------------------------------------------------------------- Encounter Discharge Information Details Patient Name: Date of Service: [name], [name] 02/01/2020 9:45 A M Medical Record Number: [id number] Patient Account Number: [id number] Date of Birth/Sex: Treating RN: September 21, 1971 (49 y.o. [name] Primary Care Cj Edgell: Hoy Register Other Clinician: Referring [name]: Treating [name]/Extender: Josephine Cables Weeks in Treatment: 3 Encounter Discharge Information Items Post Procedure Vitals Discharge Condition: Stable Temperature (F): 98.3 Ambulatory Status: Ambulatory Pulse (bpm): 89 Discharge Destination: Home Respiratory Rate (breaths/min): 18 Transportation: Private Auto Blood Pressure (mmHg): 98/63 Accompanied By: alone Schedule Follow-up Appointment: Yes Clinical Summary of Care: Patient Declined Electronic Signature(s) Signed: 02/24/2020 2:25:48 PM By: Zandra Abts RN, BSN Entered By: Zandra Abts on 02/01/2020 11:12:43 -------------------------------------------------------------------------------- Lower Extremity Assessment Details Patient Name: Date of Service: [name], [name] 02/01/2020 9:45 A M Medical Record Number: [id number] Patient Account Number: [id number] Date of Birth/Sex: Treating RN: [birth year] (49 y.o. [name] Primary Care [name]: Hoy Register Other Clinician: Referring [name]: Treating Druscilla [name]/Extender: Josephine Cables Weeks in Treatment: 3 Edema Assessment Assessed: [Left: No] [[name]: No] Edema: [Left: N] [[name]: o] Calf Left: [name]: Point of Measurement: 36 cm From Medial Instep 38.5 cm cm Ankle Left: [name]: Point of Measurement: 10 cm From Medial Instep 23 cm cm Vascular Assessment Pulses: Dorsalis Pedis Palpable: [Left:Yes] Electronic Signature(s) Signed: 02/01/2020 4:57:44 PM By: Cherylin Mylar Entered By: Cherylin Mylar on 02/01/2020 10:32:55 -------------------------------------------------------------------------------- Multi-Disciplinary Care Plan Details Patient Name: Date of Service: [name], [name] 02/01/2020 9:45 A M Medical Record Number: [id number] Patient Account Number: [id number] Date of Birth/Sex: Treating RN: 1971/11/28 (48 y.o. [name] Primary Care Cambreigh [name]: Hoy Register Other Clinician: Referring [name]: Treating [name]/Extender: Josephine Cables Weeks in Treatment: 3 Active Inactive Wound/Skin Impairment Nursing Diagnoses: Knowledge deficit related to ulceration/compromised skin integrity Goals: Patient/caregiver will verbalize understanding of skin care regimen Date Initiated: 01/11/2020 Target Resolution Date: 02/04/2020 Goal Status: Active Ulcer/skin breakdown will have a volume reduction of 30% by week 4 Date Initiated: 01/11/2020 Target Resolution Date: 02/04/2020 Goal Status: Active Interventions: Assess patient/caregiver ability to obtain necessary supplies Assess patient/caregiver ability to perform ulcer/skin care regimen upon admission and as needed Assess ulceration(s) every visit Notes: Electronic Signature(s) Signed: 02/01/2020 5:23:18 PM By: Glenn Pax RN Entered By: Jettie Pagan  Carrie on 02/01/2020 10:56:11 -------------------------------------------------------------------------------- Pain Assessment Details Patient Name: Date of Service: [name] [place], [name] RO LD 02/01/2020 9:45 A M Medical Record Number: [id number] Patient Account Number: [id number] Date of Birth/Sex: Treating RN: July 26, 1971 (49 y.o. [name] Primary Care Kamuela Magos: Hoy Register Other Clinician: Referring Roey Coopman: Treating Keron Neenan/Extender: Josephine Cables Weeks in Treatment: 3 Active Problems Location of Pain Severity and Description of Pain Patient Has Paino No Site Locations Pain Management and Medication Current Pain Management: Electronic Signature(s) Signed: 02/01/2020 4:57:44 PM By: Cherylin Mylar Entered By: Cherylin Mylar on 02/01/2020 10:32:33 -------------------------------------------------------------------------------- Patient/Caregiver Education Details Patient Name: Date of Service: [name], [name] 2/2/2021andnbsp9:45 A M Medical Record Number: [id number] Patient Account Number: [id number] Date of Birth/Gender: Treating RN: June 23, 1971 (48 y.o. [name]) [name] Primary Care Physician: Hoy Register Other Clinician: Referring Physician: Treating Physician/Extender: Leotis Pain in Treatment: 3 Education Assessment Education Provided To: Patient Education Topics Provided Wound/Skin Impairment: Methods: Explain/Verbal Responses: State content correctly Electronic Signature(s) Signed: 02/01/2020 5:23:18 PM By: Glenn Pax RN Entered By: [name] on 02/01/2020 10:56:24 -------------------------------------------------------------------------------- Wound Assessment Details Patient Name: Date of Service: [name], [name] 02/01/2020 9:45 A M Medical Record Number: [id number] Patient Account Number: [id number] Date of Birth/Sex: Treating RN: 15-Dec-1971 (48 y.o. [name]) [name] Primary Care Shivam Mestas: Hoy Register Other Clinician: Referring Monnie Anspach: Treating Matraca Hunkins/Extender: Josephine Cables Weeks in Treatment: 3 Wound Status Wound Number: 1 Primary Diabetic Wound/Ulcer of the Lower Extremity Etiology: Wound Location: Left Metatarsal head third Wound Open Wounding Event: Trauma Status: Date Acquired: 12/11/2019 Comorbid Congestive Heart Failure, Coronary Artery Disease, Weeks Of Treatment: 3 History: Hypertension, Myocardial Infarction, Type II Diabetes, Clustered Wound: No Neuropathy Photos Wound Measurements Length: (cm) 0.6 Width: (cm) 0.4 Depth: (cm) 0.6 Area: (cm) 0.188 Volume: (cm) 0.113 % Reduction in Area: 76.1% % Reduction in Volume: 71.2% Epithelialization: Small (1-33%) Tunneling: No Undermining: No Wound Description Classification: Grade 2 Wound Margin: Flat and Intact Exudate Amount: Small Exudate Type: Serosanguineous Exudate Color: red, brown Foul Odor After Cleansing: No Slough/Fibrino Yes Wound Bed Granulation Amount: Large (67-100%) Exposed Structure Granulation Quality: Pink, Pale Fascia Exposed: No Necrotic Amount: Small (1-33%) Fat  Layer (Subcutaneous Tissue) Exposed: Yes Necrotic Quality: Adherent Slough Tendon Exposed: No Muscle Exposed: No Joint Exposed: No Bone Exposed: No Electronic Signature(s) Signed: 02/08/2020 4:28:10 PM By: Benjaman Kindler EMT/HBOT Signed: 06/27/2020 5:26:37 PM By: Glenn Pax RN Previous Signature: 02/01/2020 4:57:44 PM Version By: Cherylin Mylar Entered By: Benjaman Kindler on 02/08/2020 15:16:18 -------------------------------------------------------------------------------- Vitals Details Patient Name: Date of Service: [name], [name] 02/01/2020 9:45 A M Medical Record Number: [id number] Patient Account Number: [id number] Date of Birth/Sex: Treating RN: 02-12-1971 (49 y.o. [name] Primary Care Yida Hyams: Hoy Register Other Clinician: Referring Gill Delrossi: Treating Elliana Bal/Extender: Josephine Cables Weeks in Treatment: 3 Vital Signs Time Taken: 10:30 Temperature (F): 98.3 Height (in): 78 Pulse (bpm): 89 Weight (lbs): 285 Respiratory Rate (breaths/min): 18 Body Mass Index (BMI): 32.9 Blood Pressure (mmHg): 98/63 Reference Range: 80 - 120 mg / dl Electronic Signature(s) Signed: 02/01/2020 4:57:44 PM By: Cherylin Mylar Entered By: Cherylin Mylar on 02/01/2020 10:32:22

## 2020-06-27 NOTE — Addendum Note (Signed)
Addendum  created 06/27/20 1442 by Gwenyth Allegra, CRNA   Intraprocedure Event edited

## 2020-06-27 NOTE — Progress Notes (Signed)
Pharmacy Antibiotic Note [name] is a 49 y.o. male admitted on 06/22/2020 with cellulitis / osteomyelitis.  Pharmacy has been consulted for vancomycin dosing. Patient had a debridement for his left foot on 6/23, and noticed purple of their toe and came to [facility] ED 6/24 which led to being diagnosed with osteomyelitis. Pt if afebrile and WBC is WNL. SCr is 1.25.   Plan: Continue unasyn 3g IV Q6H F/u renal fxn, C&S, clinical status and LOT *Pharmacy will sign off as no further dose adjustments are anticipated. Thank you for the consult!  Height: 6' 5.99" (198.1 cm) Weight: 124.7 kg (275 lb) IBW/kg (Calculated) : 91.38  Temp (24hrs), Avg:98.1 F (36.7 C), Min:97.6 F (36.4 C), Max:98.4 F (36.9 C)  Recent Labs  Lab 06/22/20 0735 06/23/20 0343 06/25/20 0642 06/26/20 0503 06/27/20 0354  WBC 14.6* 10.6* 8.2 8.6 8.8  CREATININE 1.28* 1.12 1.08 1.15 1.25*  LATICACIDVEN 0.9 1.2  --   --   --     Estimated Creatinine Clearance: 109.4 mL/min (A) (by C-G formula based on SCr of 1.25 mg/dL (H)).    No Known Allergies  Lysle Pearl, PharmD, BCPS Clinical Pharmacist Please see AMION for all pharmacy numbers 06/27/2020 8:25 AM

## 2020-06-27 NOTE — Plan of Care (Signed)
  Problem: Education: Goal: Knowledge of General Education information will improve Description: Including pain rating scale, medication(s)/side effects and non-pharmacologic comfort measures Outcome: Progressing   Problem: Education: Goal: Knowledge of General Education information will improve Description: Including pain rating scale, medication(s)/side effects and non-pharmacologic comfort measures Outcome: Progressing   Problem: Education: Goal: Knowledge of General Education information will improve Description: Including pain rating scale, medication(s)/side effects and non-pharmacologic comfort measures Outcome: Progressing   Problem: Education: Goal: Knowledge of General Education information will improve Description: Including pain rating scale, medication(s)/side effects and non-pharmacologic comfort measures Outcome: Progressing   Problem: Education: Goal: Knowledge of General Education information will improve Description: Including pain rating scale, medication(s)/side effects and non-pharmacologic comfort measures Outcome: Progressing   

## 2020-06-27 NOTE — Discharge Instructions (Signed)
Osteomyelitis, Adult  Bone infections (osteomyelitis) occur when bacteria or other germs get inside a bone. This can happen if you have an infection in another part of your body that spreads through your blood. Germs from your skin or from outside of your body can also cause this type of infection if you have a wound or a broken bone (fracture) that breaks the skin. Bone infections need to be treated quickly to prevent bone damage and to prevent the infection from spreading to other areas of your body. What are the causes? Most bone infections are caused by bacteria. They can also be caused by other germs, such as viruses and funguses. What increases the risk? You are more likely to develop this condition if you:  Recently had surgery, especially bone or joint surgery.  Have a long-term (chronic) disease, such as: ? Diabetes. ? HIV (human immunodeficiency virus). ? Rheumatoid arthritis. ? Sickle cell anemia. ? Kidney disease that requires dialysis.  Are aged 60 years or older.  Have a condition or take medicines that block or weaken your body's defense system (immune system).  Have a condition that reduces your blood flow.  Have an artificial joint.  Have had a joint or bone repaired with plates or screws (surgical hardware).  Use IV drugs.  Have a central line for IV access.  Have had trauma, such as stepping on a nail or a broken bone that came through the skin. What are the signs or symptoms? Symptoms vary depending on the type and location of your infection. Common symptoms of bone infections include:  Fever and chills.  Skin redness and warmth.  Swelling.  Pain and stiffness.  Drainage of fluid or pus near the infection. How is this diagnosed? This condition may be diagnosed based on:  Your symptoms and medical history.  A physical exam.  Tests, such as: ? A sample of tissue, fluid, or blood taken to be examined under a microscope. ? Pus or discharge swabbed  from a wound for testing to identify germs and to determine what type of medicine will kill them (culture and sensitivity). ? Blood tests.  Imaging studies. These may include: ? X-rays. ? MRI. ? CT scan. ? Bone scan. ? Ultrasound. How is this treated? Treatment for this condition depends on the cause and type of infection. Antibiotic medicines are usually the first treatment for a bone infection. This may be done in a hospital at first. You may have to continue IV antibiotics at home or take antibiotics by mouth for several weeks after that. Other treatments may include surgery to remove:  Dead or dying tissue from a bone.  An infected artificial joint.  Infected plates or screws that were used to repair a broken bone. Follow these instructions at home: Medicines   Take over-the-counter and prescription medicines only as told by your health care provider.  Take your antibiotic medicine as told by your health care provider. Do not stop taking the antibiotic even if you start to feel better.  Follow instructions from your health care provider about how to take IV antibiotics at home. You may need to have a nurse come to your home to give you the IV antibiotics. General instructions   Ask your health care provider if you have any restrictions on your activities.  If directed, put ice on the affected area: ? Put ice in a plastic bag. ? Place a towel between your skin and the bag. ? Leave the ice on for 20   minutes, 2-3 times a day.  Wash your hands often with soap and water. If soap and water are not available, use hand sanitizer.  Do not use any products that contain nicotine or tobacco, such as cigarettes and e-cigarettes. These can delay bone healing. If you need help quitting, ask your health care provider.  Keep all follow-up visits as told by your health care provider. This is important. Contact a health care provider if:  You develop a fever or chills.  You have  redness, warmth, pain, or swelling that returns after treatment. Get help right away if:  You have rapid breathing or you have trouble breathing.  You have chest pain.  You cannot drink fluids or make urine.  The affected area swells, changes color, or turns blue.  You have numbness or severe pain in the affected area. Summary  Bone infections (osteomyelitis) occur when bacteria or other germs get inside a bone.  You may be more likely to get this type of infection if you have a condition, such as diabetes, that lowers your ability to fight infection or increases your chances of getting an infection.  Most bone infections are caused by bacteria. They can also be caused by other germs, such as viruses and funguses.  Treatment for this condition usually starts with taking antibiotics. Further treatment depends on the cause and type of infection. This information is not intended to replace advice given to you by your health care provider. Make sure you discuss any questions you have with your health care provider. Document Revised: 01/01/2018 Document Reviewed: 12/25/2017 Elsevier Patient Education  2020 [employer]   Cellulitis, Adult  Cellulitis is a skin infection. The infected area is often warm, red, swollen, and sore. It occurs most often in the arms and lower legs. It is very important to get treated for this condition. What are the causes? This condition is caused by bacteria. The bacteria enter through a break in the skin, such as a cut, burn, insect bite, open sore, or crack. What increases the risk? This condition is more likely to occur in people who:  Have a weak body defense system (immune system).  Have open cuts, burns, bites, or scrapes on the skin.  Are older than 47 years of age.  Have a blood sugar problem (diabetes).  Have a long-lasting (chronic) liver disease (cirrhosis) or kidney disease.  Are very overweight (obese).  Have a skin problem, such  as: ? Itchy rash (eczema). ? Slow movement of blood in the veins (venous stasis). ? Fluid buildup below the skin (edema).  Have been treated with high-energy rays (radiation).  Use IV drugs. What are the signs or symptoms? Symptoms of this condition include:  Skin that is: ? Red. ? Streaking. ? Spotting. ? Swollen. ? Sore or painful when you touch it. ? Warm.  A fever.  Chills.  Blisters. How is this diagnosed? This condition is diagnosed based on:  Medical history.  Physical exam.  Blood tests.  Imaging tests. How is this treated? Treatment for this condition may include:  Medicines to treat infections or allergies.  Home care, such as: ? Rest. ? Placing cold or warm cloths (compresses) on the skin.  Hospital care, if the condition is very bad. Follow these instructions at home: Medicines  Take over-the-counter and prescription medicines only as told by your doctor.  If you were prescribed an antibiotic medicine, take it as told by your doctor. Do not stop taking it even if you  start to feel better. General instructions   Drink enough fluid to keep your pee (urine) pale yellow.  Do not touch or rub the infected area.  Raise (elevate) the infected area above the level of your heart while you are sitting or lying down.  Place cold or warm cloths on the area as told by your doctor.  Keep all follow-up visits as told by your doctor. This is important. Contact a doctor if:  You have a fever.  You do not start to get better after 1-2 days of treatment.  Your bone or joint under the infected area starts to hurt after the skin has healed.  Your infection comes back. This can happen in the same area or another area.  You have a swollen bump in the area.  You have new symptoms.  You feel ill and have muscle aches and pains. Get help right away if:  Your symptoms get worse.  You feel very sleepy.  You throw up (vomit) or have watery poop  (diarrhea) for a long time.  You see red streaks coming from the area.  Your red area gets larger.  Your red area turns dark in color. These symptoms may represent a serious problem that is an emergency. Do not wait to see if the symptoms will go away. Get medical help right away. Call your local emergency services (911 in the [place] Do not drive yourself to the hospital. Summary  Cellulitis is a skin infection. The area is often warm, red, swollen, and sore.  This condition is treated with medicines, rest, and cold and warm cloths.  Take all medicines only as told by your doctor.  Tell your doctor if symptoms do not start to get better after 1-2 days of treatment. This information is not intended to replace advice given to you by your health care provider. Make sure you discuss any questions you have with your health care provider. Document Revised: 05/07/2018 Document Reviewed: 05/07/2018 Elsevier Patient Education  2020 [employer].

## 2020-06-27 NOTE — Plan of Care (Signed)
°  Problem: Education: °Goal: Knowledge of General Education information will improve °Description: Including pain rating scale, medication(s)/side effects and non-pharmacologic comfort measures °Outcome: Progressing °  °Problem: Health Behavior/Discharge Planning: °Goal: Ability to manage health-related needs will improve °Outcome: Progressing °  °Problem: Clinical Measurements: °Goal: Ability to maintain clinical measurements within normal limits will improve °Outcome: Progressing °Goal: Will remain free from infection °Outcome: Progressing °Goal: Diagnostic test results will improve °Outcome: Progressing °Goal: Respiratory complications will improve °Outcome: Progressing °Goal: Cardiovascular complication will be avoided °Outcome: Progressing °  °Problem: Activity: °Goal: Risk for activity intolerance will decrease °Outcome: Progressing °  °Problem: Nutrition: °Goal: Adequate nutrition will be maintained °Outcome: Progressing °  °Problem: Coping: °Goal: Level of anxiety will decrease °Outcome: Progressing °  °Problem: Elimination: °Goal: Will not experience complications related to bowel motility °Outcome: Progressing °Goal: Will not experience complications related to urinary retention °Outcome: Progressing °  °Problem: Pain Managment: °Goal: General experience of comfort will improve °Outcome: Progressing °  °Problem: Safety: °Goal: Ability to remain free from injury will improve °Outcome: Progressing °  °Problem: Skin Integrity: °Goal: Risk for impaired skin integrity will decrease °Outcome: Progressing °  °Problem: Education: °Goal: Ability to describe self-care measures that may prevent or decrease complications (Diabetes Survival Skills Education) will improve °Outcome: Progressing °Goal: Individualized Educational Video(s) °Outcome: Progressing °  °Problem: Coping: °Goal: Ability to adjust to condition or change in health will improve °Outcome: Progressing °  °Problem: Fluid Volume: °Goal: Ability to  maintain a balanced intake and output will improve °Outcome: Progressing °  °Problem: Health Behavior/Discharge Planning: °Goal: Ability to identify and utilize available resources and services will improve °Outcome: Progressing °Goal: Ability to manage health-related needs will improve °Outcome: Progressing °  °Problem: Metabolic: °Goal: Ability to maintain appropriate glucose levels will improve °Outcome: Progressing °  °Problem: Nutritional: °Goal: Maintenance of adequate nutrition will improve °Outcome: Progressing °Goal: Progress toward achieving an optimal weight will improve °Outcome: Progressing °  °Problem: Skin Integrity: °Goal: Risk for impaired skin integrity will decrease °Outcome: Progressing °  °Problem: Tissue Perfusion: °Goal: Adequacy of tissue perfusion will improve °Outcome: Progressing °  °Problem: Clinical Measurements: °Goal: Ability to avoid or minimize complications of infection will improve °Outcome: Progressing °  °Problem: Skin Integrity: °Goal: Skin integrity will improve °Outcome: Progressing °  °

## 2020-06-27 NOTE — Progress Notes (Signed)
Orthopedic Tech Progress Note Patient Details:  [name] 03-19-1971 [id number] Patient was discharged by time ii came to apply POST OP SHOE Patient ID: [name], male   DOB: 04-19-1971, 49 y.o.   MRN: [id number]   [name] 06/27/2020, 10:34 AM

## 2020-06-27 NOTE — Progress Notes (Signed)
Discharge package printed and instructions given to patient. Patient verbalizes understood.

## 2020-06-27 NOTE — Discharge Summary (Signed)
Discharge Summary  [name] IHK:[id number] DOB: 1971/05/22  PCP: Hoy RegisterNewlin, Enobong, MD  Admit date: 06/22/2020 Discharge date: 06/27/2020  Time spent: 35 minutes  Recommendations for Outpatient Follow-up:  1. Follow-up with podiatry 2. Follow-up with your PCP 3. Take your medication as prescribed   Discharge Diagnoses:  Active Hospital Problems   Diagnosis Date Noted  . Osteomyelitis of fifth toe of left foot (HCC) 06/22/2020  . CKD (chronic kidney disease), stage III 06/22/2020  . Mixed diabetic hyperlipidemia associated with type 2 diabetes mellitus (HCC) 06/22/2020  . Cellulitis of left foot 06/22/2020  . Ulcer of right foot (HCC) 06/22/2020  . Chronic combined systolic and diastolic congestive heart failure (HCC) 11/13/2011  . CAD 11/13/2011  . Uncontrolled type 2 diabetes mellitus with stage 3 chronic kidney disease, with long-term current use of insulin (HCC) 12/14/2010  . Essential hypertension 12/14/2010    Resolved Hospital Problems  No resolved problems to display.    Discharge Condition: Stable  Diet recommendation: Heart healthy carb modified diet  Vitals:   06/27/20 0514 06/27/20 0805  BP: 93/62 (!) 128/98  Pulse: 74 78  Resp: 14 16  Temp: 98.2 F (36.8 C) 97.6 F (36.4 C)  SpO2: 98% 99%    History of present illness:  49 year old male with past medical history of ischemic cardiomyopathy, combined chronic diastolic and systolic CHF (Echo 08/2019 EF 40-45% with diastolic dysfunction), coronary artery disease, insulin-dependent type 2 diabetes,essential hypertension, CKD 2, obesity who presents to [facility] emergency department with left foot pain and purplish left fifth toe discoloration.  Patient was evaluated in podiatry clinic on 06/21/20 where his left foot wounds were debrided and was sent home with oral doxycycline x 14 days.  Despite taking doxycycline he states that he woke up the morning 06/22/20 with his left foot, particularly  the fifth digit of the left foot appearing to turn purple. This prompted the patient to present to [facility] emergency department for further evaluation.  In the emergency department, MRI of the left foot revealed evidence of osteomyelitis of the phalangeal bones of the little toe with possible osteomyelitis of the head of the fifth metatarsal.  Was started on IV antibiotics empiricallyRocephin and IV vancomycin.  The following day had an amputation.  MRSA screen negative.  Deep wound culture grew staph aureus with resistance to erythromycin.  IV antibiotics switched to Unasyn.  On 06/26/2020 had debridement and skin graft.  Lab studies and vital signs stable.  06/27/20: Seen and examined.  No acute events overnight.  No new complaints.  States he feels great.  Okay to discharge from podiatry standpoint.  Eager to go home. POD #4 post left fifth ray amputation on 06/23/2020 by Dr. Ardelle AntonWagoner.    POD #1 post debridement left ray amputation and skin graft done on 06/26/2020 by Dr. Ardelle AntonWagoner.    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 Ardelle AntonWagoner  Presented  with osteomyelitis of the left fifth toe and metatarsal based on MRI imaging with concurrent leukocytosis.  Additional clinical evidence of surrounding cellulitis based on examination.  Deep wound culture grew staph aureus, sensitivities showing resistance to erythromycin.  MRSA screen  negative.   Blood cultures negative to date.  Completed 3 days of intravenous ceftriaxone and vancomycin on 06/25/2020.  Switched to IV Unasyn on 06/25/2020.  Post debridement of left ray amputation and skin graft done on 06/26/2020 by Dr. Ardelle Anton.    Switch to Augmentin on 06/27/2020 x 14 days.  Will need to follow-up with Dr. Loreta Ave on Thursday as scheduled  Cellulitis of left foot  Management per above.  Continue Augmentin 1 tablet twice daily x14 days  Continue probiotics while on oral antibiotics  Follow-up with podiatry  Uncontrolled type 2 diabetes mellitus with hyperglycemia and stage 2 chronic kidney disease with long-term current use of insulin (HCC)  Hemoglobin A1c 9.3.  Resume home regimen  Follow-up with your PCP  Essential hypertension Blood pressure is at goal.  Continue home regimen of antihypertensive therapy  Follow-up with your cardiologist  Chronic combined systolic and diastolic congestive heart failure (HCC)  No evidence of cardiogenic volume overload at this time  Remains euvolemic on exam  Continue home regimen  Follow-up with your cardiologist  CAD  Denies any anginal symptoms  Continue aspirin, beta-blocker and statin  Follow-up with your cardiologist  CKD (chronic kidney disease), stage II  Baseline creatinine appears to be 1.0 with GFR greater than 60  Creatinine 1.2 with GFR greater than 60  Continue to avoid nephrotoxins  Follow-up with your PCP  Mixed diabetic hyperlipidemia associated with type 2 diabetes mellitus (HCC)  Continue home regimen of statin therapy   Code Status:Full code    Procedures: 06/23/2020:left fifth ray amputation 06/26/2020:debridement and skin graft.   Consultations:  Podiatry, Dr. Ardelle Anton  Discharge Exam: BP (!) 128/98 (BP Location: Right Arm)   Pulse 78   Temp 97.6 F (36.4 C) (Oral)   Resp 16   Ht 6' 5.99" (1.981 m)   Wt 124.7 kg   SpO2 99%   BMI  31.79 kg/m  . General: 49 y.o. year-old male well developed well nourished in no acute distress.  Alert and oriented x3. . Cardiovascular: Regular rate and rhythm with no rubs or gallops.  No thyromegaly or JVD noted.   [name] Respiratory: Clear to auscultation with no wheezes or rales. Good inspiratory effort. . Abdomen: Soft nontender nondistended with normal bowel sounds x4 quadrants. . Musculoskeletal: No lower extremity edema.  Left foot in surgical dressing. [name] Psychiatry: Mood is appropriate for condition and setting  Discharge Instructions You were cared for by a hospitalist during your hospital stay. If you have any questions about your discharge medications or the care you received while you were in the hospital after you are discharged, you can call the unit and asked to speak with the hospitalist on call if the hospitalist that took care of you is not available. Once you are discharged, your primary care physician will handle any further medical issues. Please note that NO REFILLS for any discharge medications will be authorized once you are discharged, as it is imperative that you return to your primary care physician (or establish a relationship with a primary care physician if you do not have one) for your aftercare needs so that they can reassess your need for medications and monitor your lab values.   Allergies as of 06/27/2020   No  Known Allergies     Medication List    STOP taking these medications   doxycycline 100 MG tablet Commonly known as: VIBRA-TABS     TAKE these medications   amoxicillin-clavulanate 875-125 MG tablet Commonly known as: AUGMENTIN Take 1 tablet by mouth every 12 (twelve) hours for 14 days.   aspirin 81 MG chewable tablet Chew 1 tablet (81 mg total) by mouth daily.   atorvastatin 80 MG tablet Commonly known as: LIPITOR Take 1 tablet (80 mg total) by mouth daily. What changed: when to take this   carvedilol 6.25 MG tablet Commonly known as:  COREG Take 1 tablet (6.25 mg total) by mouth 2 (two) times daily with a meal.   cetirizine 10 MG tablet Commonly known as: ZYRTEC Take 1 tablet (10 mg total) by mouth daily. What changed: when to take this   digoxin 0.125 MG tablet Commonly known as: LANOXIN TAKE 1 TABLET (125 MCG TOTAL) BY MOUTH DAILY. What changed: See the new instructions.   Farxiga 10 MG Tabs tablet Generic drug: dapagliflozin propanediol Take 10 mg by mouth daily before breakfast.   furosemide 40 MG tablet Commonly known as: LASIX Take 1 tablet (40 mg total) by mouth daily.   glipiZIDE 10 MG tablet Commonly known as: GLUCOTROL TAKE 1 TABLET BY MOUTH TWICE DAILY BEFORE MEAL(S). What changed:   how much to take  how to take this  when to take this  additional instructions   glucose blood test strip Commonly known as: True Metrix Blood Glucose Test 1 each by Other route 3 (three) times daily.   Lantus SoloStar 100 UNIT/ML Solostar Pen Generic drug: insulin glargine Inject 20 Units into the skin daily. What changed: when to take this   omeprazole 20 MG capsule Commonly known as: PRILOSEC Take 1 capsule (20 mg total) by mouth daily.   oxyCODONE-acetaminophen 10-325 MG tablet Commonly known as: PERCOCET Take 1 tablet by mouth every 8 (eight) hours as needed for up to 5 days for pain.   polyethylene glycol 17 g packet Commonly known as: MIRALAX / GLYCOLAX Take 17 g by mouth daily as needed for mild constipation.   saccharomyces boulardii 250 MG capsule Commonly known as: FLORASTOR Take 1 capsule (250 mg total) by mouth 2 (two) times daily for 14 days.   sacubitril-valsartan 97-103 MG Commonly known as: Entresto Take 1 tablet by mouth 2 (two) times daily.   sildenafil 20 MG tablet Commonly known as: REVATIO Take 1 tablet (20 mg total) by mouth as needed (for erectile dysfunction).   spironolactone 50 MG tablet Commonly known as: ALDACTONE Take 1 tablet (50 mg total) by mouth daily.    True Metrix Meter Devi 1 each by Does not apply route 3 (three) times daily.   TRUEplus Lancets 28G Misc 1 each by Does not apply route 3 (three) times daily.   TRUEplus Pen Needles 31G X 5 MM Misc Generic drug: Insulin Pen Needle USE AS DIRECTED AT BEDTIME.      No Known Allergies  Follow-up Information    Park Liter, DPM. Schedule an appointment as soon as possible for a visit in 1 day.   Specialty: Podiatry Contact information: [address] [place] [address] [phone]        Hoy Register, MD. Call in 1 day(s).   Specialty: Family Medicine Why: Please call for a post hospital follow-up appointment. Contact information: [address] [place] [address] [phone]        Shirlee Latch,  Eliot Ford, MD .   Specialty: Cardiology Contact information: [address] [place] [address] [phone]        Vivi Barrack, DPM. Call in 1 day(s).   Specialty: Podiatry Why: Please keep your appointment on Thursday, June 29, 2020. Contact information: [address] [place] [address] [phone]                The results of significant diagnostics from this hospitalization (including imaging, microbiology, ancillary and laboratory) are listed below for reference.    Significant Diagnostic Studies: MR FOOT LEFT W WO CONTRAST  Result Date: 06/22/2020 CLINICAL DATA:  Cellulitis of the left forefoot involving the dorsal aspect of the fourth and fifth toes. EXAM: MRI OF THE LEFT FOREFOOT WITHOUT AND WITH CONTRAST TECHNIQUE: Multiplanar, multisequence MR imaging of the left forefoot was performed both before and after administration of intravenous contrast. CONTRAST:  10mL GADAVIST GADOBUTROL 1 MMOL/ML IV SOLN COMPARISON:  Radiographs dated 06/22/2020 FINDINGS: Bones/Joint/Cartilage There is abnormal edema and abnormal enhancement of the phalangeal bones of the little toe with peripheral enhancement and edema of the  head of the fifth metatarsal. No discrete bone destruction. No significant joint effusions. Ligaments Intact. Muscles and Tendons And enhancement around the flexor tendon at the level of the IP joint of the little toe consistent with tenosynovitis, likely infectious. Slight edema and enhancement deep to the extensor tendon at the proximal phalanx also consistent with tenosynovitis. There is a small amount of fluid in the tendon sheath of the extensor tendon tendons at the level the navicular and cuboid. There is overlying soft tissue edema and slight enhancement of those tissues which may represent cellulitis. Soft tissues After contrast administration there is an extensive area of poorly perfused or devitalized soft tissue at the plantar aspect of head of the fifth metatarsal extending over a 3.5 Cm area. This abnormal tissue extends in the webspace between the fourth and fifth metatarsal heads and the bases of the toes. There is a small inhomogeneous fluid collection at the dorsal aspect of the little toe on images 19 and 20 of series 5 which may represent a small abscess. IMPRESSION: 1. 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 Possible osteomyelitis of the head of the fifth metatarsal. 2. Cellulitis of the little toe with a small abscess in the dorsal aspect of the little toe. 3. Tenosynovitis of the flexor and extensor tendons of the little toe, likely infected. Electronically Signed   By: Francene Boyers M.D.   On: 06/22/2020 17:59   DG Foot 2 Views Left  Result Date: 06/23/2020 CLINICAL DATA:  Fifth digit amputation, postoperative evaluation EXAM: LEFT FOOT - 2 VIEW COMPARISON:  06/22/2020 FINDINGS: Frontal and lateral views of the left foot demonstrate interval amputation at the level of the fifth metatarsal diaphysis. Tarsal changes in the overlying soft tissues. No other acute or destructive bony abnormalities. IMPRESSION: 1. Fifth digit amputation at the level of the mid fifth  metatarsal. Electronically Signed   By: Sharlet Salina M.D.   On: 06/23/2020 19:22   DG Foot Complete Left  Result Date: 06/22/2020 CLINICAL DATA:  Left foot cellulitis EXAM: LEFT FOOT - COMPLETE 3+ VIEW COMPARISON:  05/04/2020 FINDINGS: Soft tissue swelling is noted in the fifth digit consistent with the known history of cellulitis. No underlying bony erosive changes are seen. Mild calcaneal spurring is noted. No fracture is seen. IMPRESSION: Soft tissue swelling without acute bony abnormality. Electronically Signed  By: Alcide Clever M.D.   On: 06/22/2020 13:57    Microbiology: Recent Results (from the past 240 hour(s))  SARS Coronavirus 2 by RT PCR (hospital order, performed in [facility] hospital lab) Nasopharyngeal Nasopharyngeal Swab     Status: None   Collection Time: 06/22/20  8:35 PM   Specimen: Nasopharyngeal Swab  Result Value Ref Range Status   SARS Coronavirus 2 NEGATIVE NEGATIVE Final    Comment: (NOTE) SARS-CoV-2 target nucleic acids are NOT DETECTED.  The SARS-CoV-2 RNA is generally detectable in upper and lower respiratory specimens during the acute phase of infection. The lowest concentration of SARS-CoV-2 viral copies this assay can detect is 250 copies / mL. A negative result does not preclude SARS-CoV-2 infection and should not be used as the sole basis for treatment or other patient management decisions.  A negative result may occur with improper specimen collection / handling, submission of specimen other than nasopharyngeal swab, presence of viral mutation(s) within the areas targeted by this assay, and inadequate number of viral copies (<250 copies / mL). A negative result must be combined with clinical observations, patient history, and epidemiological information.  Fact Sheet for Patients:   BoilerBrush.com.cy  Fact Sheet for Healthcare Providers: https://pope.com/  This test is not yet approved or  cleared  by the [place] FDA and has been authorized for detection and/or diagnosis of SARS-CoV-2 by FDA under an Emergency Use Authorization (EUA).  This EUA will remain in effect (meaning this test can be used) for the duration of the COVID-19 declaration under Section 564(b)(1) of the Act, 21 U.S.C. section 360bbb-3(b)(1), unless the authorization is terminated or revoked sooner.  Performed at [facility], [address], [place], [place] [address]   Culture, blood (routine x 2)     Status: None   Collection Time: 06/22/20  9:25 PM   Specimen: BLOOD  Result Value Ref Range Status   Specimen Description BLOOD LEFT ANTECUBITAL  Final   Special Requests   Final    BOTTLES DRAWN AEROBIC AND ANAEROBIC Blood Culture adequate volume   Culture   Final    NO GROWTH 5 DAYS Performed at [facility], [address], [place], [place] [address]    Report Status 06/27/2020 FINAL  Final  Culture, blood (routine x 2)     Status: None   Collection Time: 06/22/20  9:30 PM   Specimen: BLOOD LEFT HAND  Result Value Ref Range Status   Specimen Description BLOOD LEFT HAND  Final   Special Requests   Final    BOTTLES DRAWN AEROBIC AND ANAEROBIC Blood Culture adequate volume   Culture   Final    NO GROWTH 5 DAYS Performed at [facility], [address] [place], [place] [address]    Report Status 06/27/2020 FINAL  Final  Surgical PCR screen     Status: None   Collection Time: 06/23/20  5:40 AM   Specimen: Nasal Mucosa; Nasal Swab  Result Value Ref Range Status   MRSA, PCR NEGATIVE NEGATIVE Final   Staphylococcus aureus NEGATIVE NEGATIVE Final    Comment: (NOTE) The Xpert SA Assay (FDA approved for NASAL specimens in patients 21 years of age and older), is one component of a comprehensive surveillance program. It is not intended to diagnose infection nor to guide or monitor treatment. Performed at [facility], [address] [place], [place] [address]    Aerobic/Anaerobic Culture (surgical/deep wound)     Status:  None (Preliminary result)   Collection Time: 06/23/20  5:33 PM   Specimen: Wound  Result Value Ref Range Status   Specimen Description WOUND LEFT FOOT  Final   Special Requests AMPUTATION RAY 5TH  Final   Gram Stain   Final    ABUNDANT WBC PRESENT, PREDOMINANTLY PMN MODERATE GRAM POSITIVE COCCI    Culture   Final    RARE STAPHYLOCOCCUS AUREUS STREPTOCOCCUS ANGINOSIS SUSCEPTIBILITIES TO FOLLOW Performed at [facility], [address], [place], [place] [address]    Report Status PENDING  Incomplete   Organism ID, Bacteria STAPHYLOCOCCUS AUREUS  Final      Susceptibility   Staphylococcus aureus - MIC*    CIPROFLOXACIN <=0.5 SENSITIVE Sensitive     ERYTHROMYCIN >=8 RESISTANT Resistant     GENTAMICIN <=0.5 SENSITIVE Sensitive     OXACILLIN <=0.25 SENSITIVE Sensitive     TETRACYCLINE <=1 SENSITIVE Sensitive     VANCOMYCIN 1 SENSITIVE Sensitive     TRIMETH/SULFA <=10 SENSITIVE Sensitive     CLINDAMYCIN <=0.25 SENSITIVE Sensitive     RIFAMPIN <=0.5 SENSITIVE Sensitive     Inducible Clindamycin NEGATIVE Sensitive     * RARE STAPHYLOCOCCUS AUREUS     Labs: Basic Metabolic Panel: Recent Labs  Lab 06/22/20 0735 06/23/20 0343 06/25/20 0642 06/26/20 0503 06/27/20 0354  NA 135 137 137 137 139  K 4.0 4.3 4.0 4.0 4.3  CL 102 103 99 101 103  CO2 20* 24 25 25 28   GLUCOSE 108* 123* 152* 178* 201*  BUN 29* 25* 19 20 21*  CREATININE 1.28* 1.12 1.08 1.15 1.25*  CALCIUM 9.2 9.1 9.1 9.2 9.1  MG  --  2.1  --   --   --    Liver Function Tests: Recent Labs  Lab 06/22/20 0735 06/23/20 0343  AST 11* 11*  ALT 13 14  ALKPHOS 50 40  BILITOT 1.1 1.0  PROT 7.5 7.2  ALBUMIN 3.6 3.1*   No results for input(s): LIPASE, AMYLASE in the last 168 hours. No results for input(s): AMMONIA in the last 168 hours. CBC: Recent Labs  Lab 06/22/20 0735 06/23/20 0343 06/25/20 0642 06/26/20 0503 06/27/20 0354  WBC 14.6* 10.6*  8.2 8.6 8.8  NEUTROABS 10.4* 7.4 5.2 4.7 4.9  HGB 13.5 13.1 13.0 13.0 12.7*  HCT 42.6 40.8 39.7 40.1 39.2  MCV 82.9 82.3 81.9 83.0 82.9  PLT 279 236 292 271 290   Cardiac Enzymes: No results for input(s): CKTOTAL, CKMB, CKMBINDEX, TROPONINI in the last 168 hours. BNP: BNP (last 3 results) No results for input(s): BNP in the last 8760 hours.  ProBNP (last 3 results) No results for input(s): PROBNP in the last 8760 hours.  CBG: Recent Labs  Lab 06/26/20 1247 06/26/20 1344 06/26/20 1643 06/26/20 2055 06/27/20 0802  GLUCAP 152* 131* 186* 228* 178*       Signed:  06/29/20, MD [facility] 06/27/2020, 9:23 AM

## 2020-06-28 ENCOUNTER — Telehealth: Payer: Self-pay

## 2020-06-28 LAB — AEROBIC/ANAEROBIC CULTURE W GRAM STAIN (SURGICAL/DEEP WOUND)

## 2020-06-28 NOTE — Telephone Encounter (Signed)
Transition Care Management Follow-up Telephone Call Date of discharge and from where: 06/27/2020, [facility]   Call placed to patient # [phone], message left with call back requested to this CM # [phone]  He needs to schedule a follow up appt with PCP

## 2020-06-29 ENCOUNTER — Ambulatory Visit (INDEPENDENT_AMBULATORY_CARE_PROVIDER_SITE_OTHER): Payer: Self-pay | Admitting: Podiatry

## 2020-06-29 ENCOUNTER — Telehealth: Payer: Self-pay

## 2020-06-29 ENCOUNTER — Other Ambulatory Visit: Payer: Self-pay

## 2020-06-29 DIAGNOSIS — E08621 Diabetes mellitus due to underlying condition with foot ulcer: Secondary | ICD-10-CM

## 2020-06-29 DIAGNOSIS — Z899 Acquired absence of limb, unspecified: Secondary | ICD-10-CM

## 2020-06-29 DIAGNOSIS — L97421 Non-pressure chronic ulcer of left heel and midfoot limited to breakdown of skin: Secondary | ICD-10-CM

## 2020-06-29 NOTE — Telephone Encounter (Signed)
From the discharge call.   Patient as appt with Dr Alvis Lemmings 07/27/2020.   he said that he has all medications and he has no questions about his medication regime at this time   He has a glucometer that has not been working correctly. He said that he changed the battery and that did not help. Explained to him that he can bring it to the clinic and Franky Macho, [facility] can check it for him.   He said that he is doing fine, the dressing remains intact on his foot and he has an appointment with podiatry this afternoon.

## 2020-06-29 NOTE — Telephone Encounter (Signed)
Transition Care Management Follow-up Telephone Call  Date of discharge and from where: 06/27/2020, [facility]   How have you been since you were released from the hospital? He states that he is fine, has his foot wrapped up.   Any questions or concerns?  none at this time   Items Reviewed:  Did the pt receive and understand the discharge instructions provided?  yes  Medications obtained and verified?  he said that he has all medications and he has no questions about his medication regime at this time   Any new allergies since your discharge? none reported   Do you have support at home? yes, his dad   no home health or DME ordered  No wound care ordered at this time. He said that that dressing has remained intact since he left the hospital  He has a glucometer that has not been working correctly. He said that he changed the battery and that did not help. Explained to him that he can bring it to the clinic and Franky Macho, [facility] can check it for him.   Has post op shoe  Functional Questionnaire: (I = Independent and D = Dependent) ADLs: independent  Follow up appointments reviewed:   PCP Hospital f/u appt confirmed?Dr Alvis Lemmings 07/27/2020  Specialist Hospital f/u appt confirmed? he said that he sees podiatry today - 06/29/2020.   Are transportation arrangements needed?  no  If their condition worsens, is the pt aware to call PCP or go to the Emergency Dept.?  yes  Was the patient provided with contact information for the PCP's office or ED?  he has the clinic phone number   Was to pt encouraged to call back with questions or concerns?  yes

## 2020-06-30 ENCOUNTER — Ambulatory Visit: Payer: Self-pay | Admitting: Podiatry

## 2020-06-30 NOTE — Op Note (Signed)
PATIENT:  [name]  49 y.o. male  PRE-OPERATIVE DIAGNOSIS:  ulcer  POST-OPERATIVE DIAGNOSIS:  ulcer  PROCEDURE:  Procedure(s): DEBRIDEMENT RAY AMPUTATION (Left)  SURGEON:  Surgeon(s) and Role:    * Trula Slade, DPM - Primary  PHYSICIAN ASSISTANT:   ASSISTANTS: none   ANESTHESIA:   MAC  EBL:  Minimal    BLOOD ADMINISTERED:none  DRAINS: none   LOCAL MEDICATIONS USED:  OTHER 20 cc lidocaine and marcaine plain  SPECIMEN:  No Specimen  DISPOSITION OF SPECIMEN:  N/A  COUNTS:  YES  TOURNIQUET:  * Missing tourniquet times found for documented tourniquets in log: 102111 *  DICTATION: .Viviann Spare Dictation  PLAN OF CARE: Admit to inpatient   PATIENT DISPOSITION:  PACU - hemodynamically stable.   Delay start of Pharmacological VTE agent (>24hrs) due to surgical blood loss or risk of bleeding: no  Indication for surgery: 49 year old male was admitted for osteomyelitis, cellulitis of his left foot.  He presented with partial fifth ray amputation and wound excision.  Discussed that given the deficit return to the operating room for further debridement and possible graft versus wound VAC application.  We discussed the surgery as well as postoperative course.  We discussed all alternatives, risks, complications.  No promises or guarantees were given all questions answered best my ability.  Procedure in detail: The patient is a verbally and visually identified by myself, nursing staff, the anesthesia staff preoperatively.  He was transferred the operating room via stretcher and placed operating table in supine position.  After an adequate plane of anesthesia was obtained a mixture of 20 cc of lidocaine, Marcaine plain was infiltrated in a regional block fashion.  The left lower extremities and scrubbed, prepped, draped in normal sterile fashion.  At this time I utilized #15 the scalpel as well as a rongeur to sharply debride the wound down to healthy,  bleeding, viable tissue.  This was debrided down to the fifth metatarsal remnant which appeared to be viable.  I utilized misonix system in order to debride the wound as well then healthy, bleeding tissue.  There is no signs of infection or abscess noted and there is no purulence.  The skin had significantly improved and there is no obvious signs of infection noted.  At this time we will proceed with a bilayer Integra graft.  500 cc of ACell micromatrix was applied followed by the bilayer Integra graft which was 5 x 5 cm.  It was fit to the wound and there is no excess graft.  Adaptic was applied to the wound followed by sterile cotton balls and dry sterile dressing.  He was awoken from anesthesia and found to tolerate the procedure well any complications.  Transferred to PACU advised on saline vascular status intact.

## 2020-07-01 NOTE — Progress Notes (Signed)
  Subjective:  Patient ID: [name], male    DOB: 09-01-1971,  MRN: [id number]  Chief Complaint  Patient presents with  . Foot Ulcer    F/U rt ulcer check Pt. stat3es," it's not hurting, but it looks aobut thesame."  Tx: silvadene, ace wrap, sx shoe -w/ redness and small amount of bloody drianage -pt denies swelling    49 y.o. male presents with the above complaint. History confirmed with patient.   Objective:  Physical Exam: warm, good capillary refill,  normal DP and PT pulses and normal sensory exam. Right Foot: right foot ulcer wihtout active erythema, no warmth. Slight periwound pink tissue. Wound base mostly granular. Measures 3x2.  Left fifth MPJ abrasion without warmth erythema signs of acute infection   Assessment:   1. 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 Cellulitis and abscess of foot     Plan:  Patient was evaluated and treated and all questions answered.  Right Foot Ulcer -Improving. -Continue dressing with silvadene ointment -Debrided, dressed with prisma and Mepilex border  Procedure: Selective Debridement of Wound Rationale: Removal of devitalized tissue from the wound to promote healing.  Pre-Debridement Wound Measurements: 3 cm x 2 cm x 0.2 cm  Post-Debridement Wound Measurements: same as pre-debridement. Type of Debridement: sharp selective Tissue Removed: Devitalized soft-tissue Dressing: Dry, sterile, compression dressing. Disposition: Patient tolerated procedure well. Patient to return in 1 week for follow-up.  Left first MPJ ulcer -Dressed with Silvadene and Mepilex      No follow-ups on file.

## 2020-07-07 ENCOUNTER — Other Ambulatory Visit: Payer: Self-pay

## 2020-07-07 ENCOUNTER — Ambulatory Visit (INDEPENDENT_AMBULATORY_CARE_PROVIDER_SITE_OTHER): Payer: Self-pay | Admitting: Podiatry

## 2020-07-07 VITALS — BP 160/89 | HR 90

## 2020-07-07 DIAGNOSIS — E08621 Diabetes mellitus due to underlying condition with foot ulcer: Secondary | ICD-10-CM

## 2020-07-07 DIAGNOSIS — L97421 Non-pressure chronic ulcer of left heel and midfoot limited to breakdown of skin: Secondary | ICD-10-CM

## 2020-07-07 NOTE — Patient Instructions (Signed)
Pre-Operative Instructions  Congratulations, you have decided to take an important step towards improving your quality of life.  You can be assured that the doctors and staff at [facility] will be with you every step of the way.  Here are some important things you should know:  1. Plan to be at the surgery center/hospital at least 1 (one) hour prior to your scheduled time, unless otherwise directed by the surgical center/hospital staff.  You must have a responsible adult accompany you, remain during the surgery and drive you home.  Make sure you have directions to the surgical center/hospital to ensure you arrive on time. 2. If you are having surgery at [facility] or [facility], you will need a copy of your medical history and physical form from your family physician within one month prior to the date of surgery. We will give you a form for your primary physician to complete.  3. We make every effort to accommodate the date you request for surgery.  However, there are times where surgery dates or times have to be moved.  We will contact you as soon as possible if a change in schedule is required.   4. No aspirin/ibuprofen for one week before surgery.  If you are on aspirin, any non-steroidal anti-inflammatory medications (Mobic, Aleve, Ibuprofen) should not be taken seven (7) days prior to your surgery.  You make take Tylenol for pain prior to surgery.  5. Medications - If you are taking daily heart and blood pressure medications, seizure, reflux, allergy, asthma, anxiety, pain or diabetes medications, make sure you notify the surgery center/hospital before the day of surgery so they can tell you which medications you should take or avoid the day of surgery. 6. No food or drink after midnight the night before surgery unless directed otherwise by surgical center/hospital staff. 7. No alcoholic beverages 24-hours prior to surgery.  No smoking 24-hours prior or 24-hours after  surgery. 8. Wear loose pants or shorts. They should be loose enough to fit over bandages, boots, and casts. 9. Don't wear slip-on shoes. Sneakers are preferred. 10. Bring your boot with you to the surgery center/hospital.  Also bring crutches or a walker if your physician has prescribed it for you.  If you do not have this equipment, it will be provided for you after surgery. 11. If you have not been contacted by the surgery center/hospital by the day before your surgery, call to confirm the date and time of your surgery. 12. Leave-time from work may vary depending on the type of surgery you have.  Appropriate arrangements should be made prior to surgery with your employer. 13. Prescriptions will be provided immediately following surgery by your doctor.  Fill these as soon as possible after surgery and take the medication as directed. Pain medications will not be refilled on weekends and must be approved by the doctor. 14. Remove nail polish on the operative foot and avoid getting pedicures prior to surgery. 15. Wash the night before surgery.  The night before surgery wash the foot and leg well with water and the antibacterial soap provided. Be sure to pay special attention to beneath the toenails and in between the toes.  Wash for at least three (3) minutes. Rinse thoroughly with water and dry well with a towel.  Perform this wash unless told not to do so by your physician.  Enclosed: 1 Ice pack (please put in freezer the night before surgery)   1 Hibiclens skin cleaner     Pre-op instructions  If you have any questions regarding the instructions, please do not hesitate to call our office.  [place]: [address], [place], [place] [address] -- [phone]  [place]: [address] [place], [place] [address] -- [phone]  [place]: [address], [place], [place] [address] -- [phone]   Website: [email or web address] 

## 2020-07-08 NOTE — Progress Notes (Signed)
  Subjective:  Patient ID: [name], male    DOB: 06-06-1971,  MRN: [id number]  Chief Complaint  Patient presents with  . Routine Post Op    POV#2 DOS 6.25.2021 AMPUTATION RAY LEFT 5TH. Pt denies fever/chills/nausea/vomiting. No new concerns.    49 y.o. male presents for wound care. Hx confirmed with patient.  Has left the dressing intact denies concerns. Objective:  Physical Exam: Wound Location: Left fifth ray resection  Wound Measurement: 4*4*2 Wound Base: Mixed Granular/Fibrotic Peri-wound: Normal Exudate: None: wound tissue dry wound without warmth, erythema, signs of acute infection    Assessment:   1. 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  Ulcer left 5th ray resection -Staples removed from the previous Integra wound graft.  Graft removed. -Saline wet-to-dry dressing applied today educated on performing wet-to-dry dressing daily -Discussed the patient benefit from repeat graft application.  Plan to perform procedure next week.  Surgical consent forms were drafted and reviewed with patient -Patient has failed all conservative therapy and wishes to proceed with surgical intervention. All risks, benefits, and alternatives discussed with patient. No guarantees given. Consent reviewed and signed by patient. -Planned procedures: Left foot debridement irrigation possible application skin graft substitute -He will need VAC therapy -we will see if we can get the process started No follow-ups on file.

## 2020-07-08 NOTE — H&P (View-Only) (Signed)
°  Subjective:  Patient ID: [name], male    DOB: 03/12/1971,  MRN: [id number]  Chief Complaint  Patient presents with  . Routine Post Op    POV#2 DOS 6.25.2021 AMPUTATION RAY LEFT 5TH. Pt denies fever/chills/nausea/vomiting. No new concerns.    48 y.o. male presents for wound care. Hx confirmed with patient.  Has left the dressing intact denies concerns. Objective:  Physical Exam: Wound Location: Left fifth ray resection  Wound Measurement: 4*4*2 Wound Base: Mixed Granular/Fibrotic Peri-wound: Normal Exudate: None: wound tissue dry wound without warmth, erythema, signs of acute infection    Assessment:   1. 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  Ulcer left 5th ray resection -Staples removed from the previous Integra wound graft.  Graft removed. -Saline wet-to-dry dressing applied today educated on performing wet-to-dry dressing daily -Discussed the patient benefit from repeat graft application.  Plan to perform procedure next week.  Surgical consent forms were drafted and reviewed with patient -Patient has failed all conservative therapy and wishes to proceed with surgical intervention. All risks, benefits, and alternatives discussed with patient. No guarantees given. Consent reviewed and signed by patient. -Planned procedures: Left foot debridement irrigation possible application skin graft substitute -He will need VAC therapy -we will see if we can get the process started No follow-ups on file.  

## 2020-07-09 NOTE — Progress Notes (Addendum)
Subjective: [name] is a 49 y.o. is seen today in office s/p left foot partial fifth reputation with repeat wound debridement, Integra graft appliction preformed on 06/26/2020.  He states the pain is controlled.  Denies any systemic complaints such as fevers, chills, nausea, vomiting. No calf pain, chest pain, shortness of breath.   He developed an ulcer on the right foot after being at work. He was initially seen at the wound care center around December 2020 and was released but unfortunately the wound worsened.   Objective: General: No acute distress, AAOx3  DP/PT pulses palpable 2/4, CRT < 3 sec to all digits.  LEFT foot: 4 x 4 centimeter wound present to the lateral aspect of foot along the precipitation site with Integra graft intact with staples.  There is no surrounding erythema, ascending cellulitis.  No fluctuation crepitation.  There is no malodor.  No other open lesions or pre-ulcerative lesions.  No pain with calf compression, swelling, warmth, erythema.   Assessment and Plan:  Status post left foot partial fifth amputation, doing well with no complications   -Treatment options discussed including all alternatives, risks, and complications -Graft appears to be incorporating and is intact without any signs of infection.  Dressing was reapplied today.  Keep dressing clean, dry, intact we will see him back next week.  I had Dr. Samuella Cota to see the patient today as well and he will follow up with him next week. -Monitor for any clinical signs or symptoms of infection and directed to call the office immediately should any occur or go to the ER.  Return in about 1 week (around 07/06/2020).  Vivi Barrack DPM

## 2020-07-10 ENCOUNTER — Telehealth: Payer: Self-pay | Admitting: Podiatry

## 2020-07-10 ENCOUNTER — Telehealth: Payer: Self-pay | Admitting: *Deleted

## 2020-07-10 ENCOUNTER — Telehealth (HOSPITAL_COMMUNITY): Payer: Self-pay | Admitting: Cardiology

## 2020-07-10 ENCOUNTER — Telehealth: Payer: Self-pay | Admitting: Family Medicine

## 2020-07-10 MED ORDER — AMOXICILLIN-POT CLAVULANATE 875-125 MG PO TABS: 1.0000 | ORAL_TABLET | Freq: Two times a day (BID) | ORAL | 0 refills | Status: DC

## 2020-07-10 MED FILL — $LANTUS SOLOSTAR 100 UNITS/: 100 | 30 days supply | Qty: 6 | Fill #3

## 2020-07-10 MED FILL — AMOX-CLAV 875-125 MG TABLET: 875-125 | 7 days supply | Qty: 14 | Fill #0

## 2020-07-10 NOTE — Telephone Encounter (Signed)
Left message requesting assistance for charity wound vac assistance.

## 2020-07-10 NOTE — Telephone Encounter (Signed)
-----   Message from Park Liter, DPM sent at 07/08/2020  5:36 PM EDT ----- Can we apply for charity [facility] for patient - measurements 4x4x2 last debrided 06/27/20

## 2020-07-10 NOTE — Addendum Note (Signed)
Addended by: [name] on: 07/10/2020 09:12 AM   Modules accepted: Orders

## 2020-07-10 NOTE — Telephone Encounter (Signed)
Patient was called and informed of providers response. 

## 2020-07-10 NOTE — Telephone Encounter (Signed)
Please advise   Copied from CRM [id number]. Topic: General - Other >> Jul 10, 2020  9:17 AM Dalphine Handing A wrote: Patient is requesting a callback from Dr .Earley Abide nurse as soon as possible in regards to patient having a surgery at he [facility] on Friday at 2pm. Patient stated that [facility] is requesting a surgical clearance form be completed by patients PCP before surgery on Friday. Patient would like to confirm if he needs an appointment for the form to be filled out or if he needs to bring in form for Dr.Newlin to complete without an appointment, Please advise

## 2020-07-10 NOTE — Telephone Encounter (Signed)
He will need to obtain a cardiac clearance from his cardiology.  From a diabetes standpoint I am unable to clear him as the last A1c I have on file is 9.3 from 05/2020 and he has not seen me since 01/2020.  If his surgeon is okay with that A1c then he will need to contact his Cardiologist for cardiac clearance.

## 2020-07-10 NOTE — Telephone Encounter (Signed)
Pt called stating he is going to run out of his antibiotic today and would like to have a refill sent in.   Please assisit

## 2020-07-10 NOTE — Telephone Encounter (Signed)
OK for foot surgery.

## 2020-07-10 NOTE — Telephone Encounter (Signed)
Will route to PCP for review. 

## 2020-07-10 NOTE — Telephone Encounter (Signed)
Dr.McLean please advise.

## 2020-07-10 NOTE — Telephone Encounter (Signed)
Patient stated he is having a 2nd surgery on his foot Friday @2pm  in Outpatient, is requesting a well-being letter stating it is ok from the dr. To go ahead with procedure, please send email to [email or web address]

## 2020-07-10 NOTE — Telephone Encounter (Signed)
Will refill for 7 days for persistent high risk open wound, but he will likely not need any more after that.

## 2020-07-11 NOTE — Telephone Encounter (Signed)
[employer]*1M - S. Earlene Plater states [facility], the right hand side must be signed by pt and the total income can not be zero.

## 2020-07-11 NOTE — Telephone Encounter (Signed)
I spoke with pt and informed of the necessity of completing the form properly and he stated understanding and would come to our office and sign.

## 2020-07-11 NOTE — Telephone Encounter (Signed)
Pt presents to office and completes [employer] Application for [facility] form. Pt asked if [facility] - Surgical Coordinator had received what she needed for hospital surgery today. Loel Ro - Surgical Coordinator states there is a note in Epic from his cardiologist okay the surgery and pt's clinicals will have to be reviewed by Pre-op Nurse but it appears the surgery will be on for 07/14/2020. Pt asked the time to arrive at [facility] for surgery and S. Durant stated 12:30pm. I informed pt.

## 2020-07-12 ENCOUNTER — Other Ambulatory Visit (HOSPITAL_COMMUNITY)
Admission: RE | Admit: 2020-07-12 | Discharge: 2020-07-12 | Disposition: A | Discharge status: Home / Self Care | Payer: HRSA Program | Source: Ambulatory Visit | Attending: Podiatry | Admitting: Podiatry

## 2020-07-12 DIAGNOSIS — Z20822 Contact with and (suspected) exposure to covid-19: Secondary | ICD-10-CM | POA: Insufficient documentation

## 2020-07-12 DIAGNOSIS — Z01812 Encounter for preprocedural laboratory examination: Secondary | ICD-10-CM | POA: Insufficient documentation

## 2020-07-12 LAB — SARS CORONAVIRUS 2 (TAT 6-24 HRS): SARS Coronavirus 2: NEGATIVE

## 2020-07-12 NOTE — Progress Notes (Signed)
  Subjective:  Patient ID: Doctor Sheahan, male    DOB: [birth year],  MRN: [id number]  Chief Complaint  Patient presents with  . Diabetic Ulcer    L foot. Pt stated, "The callus came off on its own 1 week ago. Pus came out. I started applying Silvadene again on Sunday (3 days ago). Wearing surgical shoe".   49 y.o. male presents with the above complaint. History confirmed with patient.   Objective:  Physical Exam: warm, good capillary refill,  normal DP and PT pulses and normal sensory exam. Right Foot: right foot ulcer wihtout active erythema, no warmth. Slight periwound pink tissue. Wound base mostly granular.   Left fifth MPJ wound with open ulcer measuring 0.5x0.5 without warmth erythema signs of infeciton probe to bone.   Assessment:   1. 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  Right Foot Ulcer -Improving. -Continue dressing with silvadene ointment  Left first MPJ ulcer -XR without signs of OM -Debrided with 312 blade without incident -Dressed with Silvadene and Mepilex      No follow-ups on file.

## 2020-07-12 NOTE — Progress Notes (Signed)
  Subjective:  Patient ID: [name], male    DOB: 1971-12-20,  MRN: [id number]  Chief Complaint  Patient presents with  . Wound Check    pt is here for a wound check of the left foot, pt states that he is concerned that the left foot is draining and oozing, pt also states that he is well bandaged, and states that he has been applying the cream.    49 y.o. male presents for wound care. Hx confirmed with patient.  Objective:  Physical Exam: Wound Location: left 5th MPJ Wound Measurement: 2x2 Wound Base: Granular/Healthy Peri-wound: Calloused Exudate: Scant/small amount Serosanguinous exudate Worsening periwound redness noted. Wound probes to capsule. No probe to bone  Assessment:   1. 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  Ulcer Left 5th Met -Wound base cauterized with silver nitrate -Dressing applied consisting of mepilex border dressing -Wound cleansed and debrided -Rx keflex  Procedure: Selective Debridement of Wound Rationale: Removal of devitalized tissue from the wound to promote healing.  Pre-Debridement Wound Measurements: 2 cm x 2 cm x 0.2 cm  Post-Debridement Wound Measurements: same as pre-debridement. Type of Debridement: sharp selective Tissue Removed: Devitalized soft-tissue Dressing: Dry, sterile, compression dressing. Disposition: Patient tolerated procedure well. Patient to return in 1 week for follow-up.     Return in about 1 week (around 06/01/2020).

## 2020-07-12 NOTE — Progress Notes (Signed)
Subjective:  Patient ID: [name], male    DOB: [birth year],  MRN: [id number]  Chief Complaint  Patient presents with   Wound Check    Bilateral wound check. Pt states right foot callous has had some skin come off. Denies any drainage. Denies fever/chills/nausea/vomiting. Pt states he has finished his antibiotics.    49 y.o. male presents for wound care. Hx confirmed with patient.  Objective:  Physical Exam: Wound Location: left 5th MPJ Wound Measurement: 1.5x2 Wound Base: Granular/Healthy Peri-wound: Calloused Exudate: Scant/small amount Serosanguinous exudate   Assessment:   1. 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  Ulcer Left 5th Met -Dressing applied consisting of mepilex border dressing -Wound cleansed and debrided  Procedure: Selective Debridement of Wound Rationale: Removal of devitalized tissue from the wound to promote healing.  Pre-Debridement Wound Measurements: 1.5 cm x 2 cm x 0.2 cm  Post-Debridement Wound Measurements: same as pre-debridement. Type of Debridement: sharp selective Tissue Removed: Devitalized soft-tissue Dressing: Dry, sterile, compression dressing. Disposition: Patient tolerated procedure well. Patient to return in 1 week for follow-up.     No follow-ups on file.

## 2020-07-12 NOTE — Progress Notes (Signed)
  Subjective:  Patient ID: [name], male    DOB: 1971/01/22,  MRN: [id number]  Chief Complaint  Patient presents with  . Cellulitis    f/u appt, right side of foot is "looking better, not as pink as before"   49 y.o. male presents with the above complaint. History confirmed with patient.   Objective:  Physical Exam: warm, good capillary refill,  normal DP and PT pulses and normal sensory exam. Right Foot: right foot HPJ submet 5 receding cellulitis no active warmth.  No continued purulence.  Wound base fibrogranular without necrosis   Assessment:   1. Cellulitis and abscess of foot     Plan:  Patient was evaluated and treated and all questions answered.  Right Foot Cellulitis s/p I&D -Culture reviewed - E faecalis -Rx silvadene and ampicillin -Did not get MRI secondary to cost -Dressed with silvadene and DSD today -Does appear improved.   Return in about 1 week (around 03/31/2020) for Wound Care, Right.

## 2020-07-13 ENCOUNTER — Other Ambulatory Visit: Payer: Self-pay

## 2020-07-13 ENCOUNTER — Encounter (HOSPITAL_COMMUNITY): Payer: Self-pay | Admitting: Podiatry

## 2020-07-13 NOTE — Anesthesia Preprocedure Evaluation (Addendum)
Anesthesia Evaluation  Patient identified by MRN, date of birth, ID band Patient awake    Reviewed: Allergy & Precautions, NPO status , Patient's Chart, lab work & pertinent test results, reviewed documented beta blocker date and time   Airway Mallampati: III  TM Distance: >3 FB Neck ROM: Full    Dental  (+) Teeth Intact, Dental Advisory Given   Pulmonary sleep apnea ,    Pulmonary exam normal breath sounds clear to auscultation       Cardiovascular hypertension, Pt. on home beta blockers and Pt. on medications + CAD, + Past MI, + Cardiac Stents and +CHF  Normal cardiovascular exam Rhythm:Regular Rate:Normal     Neuro/Psych negative neurological ROS  negative psych ROS   GI/Hepatic Neg liver ROS, GERD  Medicated and Controlled,  Endo/Other  diabetes, Type 2, Insulin Dependent, Oral Hypoglycemic AgentsObesity   Renal/GU Renal InsufficiencyRenal disease     Musculoskeletal negative musculoskeletal ROS (+)   Abdominal   Peds  Hematology negative hematology ROS (+)   Anesthesia Other Findings Day of surgery medications reviewed with the patient.  Reproductive/Obstetrics                            Anesthesia Physical Anesthesia Plan  ASA: III  Anesthesia Plan: MAC and Regional   Post-op Pain Management:  Regional for Post-op pain   Induction: Intravenous  PONV Risk Score and Plan: 1 and Propofol infusion and Treatment may vary due to age or medical condition  Airway Management Planned: Nasal Cannula and Natural Airway  Additional Equipment:   Intra-op Plan:   Post-operative Plan:   Informed Consent: I have reviewed the patients History and Physical, chart, labs and discussed the procedure including the risks, benefits and alternatives for the proposed anesthesia with the patient or authorized representative who has indicated his/her understanding and acceptance.     Dental  advisory given  Plan Discussed with: CRNA  Anesthesia Plan Comments: (PAT note by Karoline Caldwell, PA-C: Follows with cardiology for hx of CAD and ischemic cardiomyopathy.  He initially had NSTEMI in 10/12 with DES to proximal LAD. At that time, EF was noted to be down to 15%.  Over time, EF improved to 50% (3/13 echo). Echo in 10/17 showed EF back down to 10% with severe functional MR.  He was taken for cardiac cath.  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  He had DES x 2 to LCx/OM2 and had a staged procedure with DES to distal RCA/ostial PDA. Most recent echo 08/31/19 showed EF 40-45%. Last seen by Dr. Aundra Dubin 05/26/20, NYHA class II at that time, no fluid overload. Pt cleared for surgery per telephone encounter from Dr. Aundra Dubin 07/10/20.   IDDMII, last A1c 9.3 on 06/22/20.   Will need DOS labs and eval.   EKG 05/26/20: Normal sinus rhythm. Rate 83. Inferior infarct , age undetermined. Possible Anterolateral infarct , age undetermined. No significant change since last tracing  TTE 08/31/19: 1. The left ventricle has mild-moderately reduced systolic function, with  an ejection fraction of 40-45%. The cavity size was normal. Left  ventricular diastolic Doppler parameters are consistent with impaired  relaxation.  2. The right ventricle has normal systolic function. The cavity was  normal. There is no increase in right ventricular wall thickness.  3. Trivial pericardial effusion is present.  4. Mild to moderate aortic annular calcification noted.  5. The tricuspid valve is grossly normal.  6. The aorta is normal unless otherwise noted.  7. The aortic root and ascending aorta are normal in size and structure.  8. The atrial septum is grossly normal.   )       Anesthesia Quick Evaluation

## 2020-07-13 NOTE — Progress Notes (Signed)
Anesthesia Chart Review: Same day workup  Follows with cardiology for hx of CAD and ischemic cardiomyopathy.  He initially had NSTEMI in 10/12 with DES to proximal LAD. At that time, EF was noted to be down to 15%.  Over time, EF improved to 50% (3/13 echo). Echo in 10/17 showed EF back down to 10% with severe functional MR.  He was taken for cardiac cath.  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  He had DES x 2 to LCx/OM2 and had a staged procedure with DES to distal RCA/ostial PDA. Most recent echo 08/31/19 showed EF 40-45%. Last seen by Dr. Shirlee Latch 05/26/20, NYHA class II at that time, no fluid overload. Pt cleared for surgery per telephone encounter from Dr. Shirlee Latch 07/10/20.   IDDMII, last A1c 9.3 on 06/22/20.   Will need DOS labs and eval.   EKG 05/26/20: Normal sinus rhythm. Rate 83. Inferior infarct , age undetermined. Possible Anterolateral infarct , age undetermined. No significant change since last tracing  TTE 08/31/19: 1. The left ventricle has mild-moderately reduced systolic function, with  an ejection fraction of 40-45%. The cavity size was normal. Left  ventricular diastolic Doppler parameters are consistent with impaired  relaxation.  2. The right ventricle has normal systolic function. The cavity was  normal. There is no increase in right ventricular wall thickness.  3. Trivial pericardial effusion is present.  4. Mild to moderate aortic annular calcification noted.  5. The tricuspid valve is grossly normal.  6. The aorta is normal unless otherwise noted.  7. The aortic root and ascending aorta are normal in size and structure.  8. The atrial septum is grossly normal.   Zannie Cove [facility] Short Stay Center/Anesthesiology Phone [phone] 07/13/2020 12:08 PM

## 2020-07-13 NOTE — Telephone Encounter (Signed)
Detailed message left for Nicolette Bang explaining H&P is available in epic, as well as Dr Kathlyn [place] ok for foot surgery. Form completed and H&P attached

## 2020-07-13 NOTE — Progress Notes (Signed)
Spoke with pt for pre-op call. Pt has hx of CAD with stents several years ago. Pt denies any recent chest pain or sob. Pt's cardiologist is Dr. Shirlee Latch and cardiac clearance noted in Epic on 07/10/20. H&P noted in ED note on 06/22/20. Pt is a type 2 diabetic. Last A1C was 9.3 on 06/22/20. Pt states his CBG meter is broken and has not been able to check his blood sugar at home for several weeks. He states when he was in the hospital in June, his fasting blood sugar was between 130-150. Instructed pt to hold his Glipizide this evening and in the AM. Instructed him to take 1/2 of his regular dose of Lantus insulin this evening (he will take 10 units).   Covid test done on 07/12/20 and it's negative. Pt states he's been in quarantine since the test was done and understands he needs to stay in quarantine until he comes for surgery.   ERAS protocol ordered. Instructed pt not to eat food after midnight, but may have clear liquids until 11:00 AM (reviewed clear liquid list with pt). Instructed him to drink diabetic drinks. Pt voiced understanding.

## 2020-07-14 ENCOUNTER — Encounter (HOSPITAL_COMMUNITY): Payer: Self-pay | Admitting: Podiatry

## 2020-07-14 ENCOUNTER — Other Ambulatory Visit: Payer: Self-pay

## 2020-07-14 ENCOUNTER — Encounter (HOSPITAL_COMMUNITY)
Admission: RE | Disposition: A | Discharge status: Home / Self Care | Payer: Self-pay | Source: Home / Self Care | Attending: Podiatry

## 2020-07-14 ENCOUNTER — Ambulatory Visit (HOSPITAL_COMMUNITY): Payer: Self-pay | Admitting: Physician Assistant

## 2020-07-14 ENCOUNTER — Ambulatory Visit (HOSPITAL_COMMUNITY)
Admission: RE | Admit: 2020-07-14 | Discharge: 2020-07-14 | Disposition: A | Discharge status: Home / Self Care | Payer: Self-pay | Attending: Podiatry | Admitting: Podiatry

## 2020-07-14 DIAGNOSIS — E1122 Type 2 diabetes mellitus with diabetic chronic kidney disease: Secondary | ICD-10-CM | POA: Insufficient documentation

## 2020-07-14 DIAGNOSIS — L97425 Non-pressure chronic ulcer of left heel and midfoot with muscle involvement without evidence of necrosis: Secondary | ICD-10-CM

## 2020-07-14 DIAGNOSIS — I130 Hypertensive heart and chronic kidney disease with heart failure and stage 1 through stage 4 chronic kidney disease, or unspecified chronic kidney disease: Secondary | ICD-10-CM | POA: Insufficient documentation

## 2020-07-14 DIAGNOSIS — K219 Gastro-esophageal reflux disease without esophagitis: Secondary | ICD-10-CM | POA: Insufficient documentation

## 2020-07-14 DIAGNOSIS — Z89422 Acquired absence of other left toe(s): Secondary | ICD-10-CM | POA: Insufficient documentation

## 2020-07-14 DIAGNOSIS — I252 Old myocardial infarction: Secondary | ICD-10-CM | POA: Insufficient documentation

## 2020-07-14 DIAGNOSIS — I5043 Acute on chronic combined systolic (congestive) and diastolic (congestive) heart failure: Secondary | ICD-10-CM | POA: Insufficient documentation

## 2020-07-14 DIAGNOSIS — L97521 Non-pressure chronic ulcer of other part of left foot limited to breakdown of skin: Secondary | ICD-10-CM | POA: Insufficient documentation

## 2020-07-14 DIAGNOSIS — I2510 Atherosclerotic heart disease of native coronary artery without angina pectoris: Secondary | ICD-10-CM | POA: Insufficient documentation

## 2020-07-14 DIAGNOSIS — N183 Chronic kidney disease, stage 3 unspecified: Secondary | ICD-10-CM | POA: Insufficient documentation

## 2020-07-14 DIAGNOSIS — Z955 Presence of coronary angioplasty implant and graft: Secondary | ICD-10-CM | POA: Insufficient documentation

## 2020-07-14 DIAGNOSIS — E11621 Type 2 diabetes mellitus with foot ulcer: Secondary | ICD-10-CM | POA: Insufficient documentation

## 2020-07-14 DIAGNOSIS — Z7984 Long term (current) use of oral hypoglycemic drugs: Secondary | ICD-10-CM | POA: Insufficient documentation

## 2020-07-14 DIAGNOSIS — G4730 Sleep apnea, unspecified: Secondary | ICD-10-CM | POA: Insufficient documentation

## 2020-07-14 HISTORY — DX: Gastro-esophageal reflux disease without esophagitis: K21.9

## 2020-07-14 HISTORY — PX: GRAFT APPLICATION: SHX6696

## 2020-07-14 HISTORY — PX: I & D EXTREMITY: SHX5045

## 2020-07-14 LAB — BASIC METABOLIC PANEL
Anion gap: 9 (ref 5–15)
BUN: 23 mg/dL — ABNORMAL HIGH (ref 6–20)
CO2: 23 mmol/L (ref 22–32)
Calcium: 9.2 mg/dL (ref 8.9–10.3)
Chloride: 107 mmol/L (ref 98–111)
Creatinine, Ser: 1.06 mg/dL (ref 0.61–1.24)
GFR calc Af Amer: >60 mL/min (ref >60)
GFR calc non Af Amer: >60 mL/min (ref >60)
Glucose, Bld: 145 mg/dL — ABNORMAL HIGH (ref 70–99)
Potassium: 4.2 mmol/L (ref 3.5–5.1)
Sodium: 139 mmol/L (ref 135–145)

## 2020-07-14 LAB — GLUCOSE, CAPILLARY
Glucose-Capillary: 157 mg/dL — ABNORMAL HIGH (ref 70–99)
Glucose-Capillary: 127 mg/dL — ABNORMAL HIGH (ref 70–99)
Glucose-Capillary: 119 mg/dL — ABNORMAL HIGH (ref 70–99)

## 2020-07-14 SURGERY — IRRIGATION AND DEBRIDEMENT EXTREMITY
Anesthesia: Monitor Anesthesia Care | Laterality: Left

## 2020-07-14 MED ORDER — ONDANSETRON HCL 4 MG/2ML IJ SOLN: 4.0000 mg | Freq: Once | INTRAMUSCULAR | Status: DC | PRN

## 2020-07-14 MED ORDER — AMOXICILLIN-POT CLAVULANATE 875-125 MG PO TABS: 1.0000 | ORAL_TABLET | Freq: Two times a day (BID) | ORAL | 0 refills | Status: AC

## 2020-07-14 MED ORDER — FENTANYL CITRATE (PF) 100 MCG/2ML IJ SOLN: 50.0000 ug | Freq: Once | INTRAMUSCULAR | Status: AC

## 2020-07-14 MED ORDER — LACTATED RINGERS IV SOLN: INTRAVENOUS | Status: DC | PRN

## 2020-07-14 MED ORDER — FENTANYL CITRATE (PF) 100 MCG/2ML IJ SOLN
INTRAMUSCULAR | Status: AC
  Administered 2020-07-14: 50 ug via INTRAVENOUS
  Filled 2020-07-14: qty 2

## 2020-07-14 MED ORDER — 0.9 % SODIUM CHLORIDE (POUR BTL) OPTIME
TOPICAL | Status: DC | PRN
  Administered 2020-07-14: 1000 mL

## 2020-07-14 MED ORDER — BUPIVACAINE HCL (PF) 0.5 % IJ SOLN
INTRAMUSCULAR | Status: AC
  Filled 2020-07-14: qty 30

## 2020-07-14 MED ORDER — DEXTROSE 5 % IV SOLN
3.0000 g | INTRAVENOUS | Status: AC
  Administered 2020-07-14: 3 g via INTRAVENOUS
  Filled 2020-07-14: qty 3000

## 2020-07-14 MED ORDER — MIDAZOLAM HCL 2 MG/2ML IJ SOLN
INTRAMUSCULAR | Status: AC
  Administered 2020-07-14: 2 mg via INTRAVENOUS
  Filled 2020-07-14: qty 2

## 2020-07-14 MED ORDER — MIDAZOLAM HCL 2 MG/2ML IJ SOLN: 2.0000 mg | Freq: Once | INTRAMUSCULAR | Status: AC

## 2020-07-14 MED ORDER — LACTATED RINGERS IV SOLN: INTRAVENOUS | Status: DC

## 2020-07-14 MED ORDER — VANCOMYCIN HCL 1000 MG IV SOLR
INTRAVENOUS | Status: DC | PRN
  Administered 2020-07-14: 1000 mg

## 2020-07-14 MED ORDER — CHLORHEXIDINE GLUCONATE 0.12 % MT SOLN: 15.0000 mL | Freq: Once | OROMUCOSAL | Status: AC

## 2020-07-14 MED ORDER — ORAL CARE MOUTH RINSE: 15.0000 mL | Freq: Once | OROMUCOSAL | Status: AC

## 2020-07-14 MED ORDER — SODIUM CHLORIDE 0.9 % IR SOLN
Status: DC | PRN
  Administered 2020-07-14: 1000 mL

## 2020-07-14 MED ORDER — FENTANYL CITRATE (PF) 100 MCG/2ML IJ SOLN: 25.0000 ug | INTRAMUSCULAR | Status: DC | PRN

## 2020-07-14 MED ORDER — BUPIVACAINE-EPINEPHRINE (PF) 0.5% -1:200000 IJ SOLN
INTRAMUSCULAR | Status: DC | PRN
  Administered 2020-07-14: 30 mL via PERINEURAL

## 2020-07-14 MED ORDER — CHLORHEXIDINE GLUCONATE 0.12 % MT SOLN
OROMUCOSAL | Status: AC
  Administered 2020-07-14: 15 mL via OROMUCOSAL
  Filled 2020-07-14: qty 15

## 2020-07-14 MED ORDER — ACETAMINOPHEN 500 MG PO TABS
1000.0000 mg | ORAL_TABLET | Freq: Once | ORAL | Status: AC
  Administered 2020-07-14: 1000 mg via ORAL
  Filled 2020-07-14: qty 2

## 2020-07-14 MED ORDER — VANCOMYCIN HCL 1000 MG IV SOLR
INTRAVENOUS | Status: AC
  Filled 2020-07-14: qty 1000

## 2020-07-14 MED ORDER — HYDROCODONE-ACETAMINOPHEN 5-325 MG PO TABS: 1.0000 | ORAL_TABLET | ORAL | 0 refills | Status: DC | PRN

## 2020-07-14 MED FILL — HYDROCODON-APAP 5-325: 5-325 | 2 days supply | Qty: 12 | Fill #0

## 2020-07-14 MED FILL — AMOX-CLAV 875-125 MG TABLET: 875-125 | 5 days supply | Qty: 10 | Fill #0

## 2020-07-14 SURGICAL SUPPLY — 49 items
APL PRP STRL LF DISP 70% ISPRP (MISCELLANEOUS) ×1
BLADE SURG SZ11 CARB STEEL (BLADE) ×3 IMPLANT
BNDG CMPR 9X4 STRL LF SNTH (GAUZE/BANDAGES/DRESSINGS)
BNDG ELASTIC 4X5.8 VLCR STR LF (GAUZE/BANDAGES/DRESSINGS) ×3 IMPLANT
BNDG ESMARK 4X9 LF (GAUZE/BANDAGES/DRESSINGS) IMPLANT
BNDG GAUZE ELAST 4 BULKY (GAUZE/BANDAGES/DRESSINGS) ×6 IMPLANT
CHLORAPREP W/TINT 26 (MISCELLANEOUS) ×3 IMPLANT
COVER MAYO STAND STRL (DRAPES) ×3 IMPLANT
COVER SURGICAL LIGHT HANDLE (MISCELLANEOUS) ×3 IMPLANT
COVER WAND RF STERILE (DRAPES) ×3 IMPLANT
CUFF TOURN SGL QUICK 18X4 (TOURNIQUET CUFF) IMPLANT
CUFF TOURN SGL QUICK 34 (TOURNIQUET CUFF)
CUFF TRNQT CYL 34X4.125X (TOURNIQUET CUFF) IMPLANT
DRAPE U-SHAPE 47X51 STRL (DRAPES) ×3 IMPLANT
DRESSING MATRIX 2X2 BILAYER (Tissue) ×1 IMPLANT
DRSG EMULSION OIL 3X3 NADH (GAUZE/BANDAGES/DRESSINGS) ×3 IMPLANT
DRSG MATRIX 2X2 BILAYER (Tissue) ×3 IMPLANT
ELECT CAUTERY BLADE 6.4 (BLADE) ×3 IMPLANT
ELECT REM PT RETURN 9FT ADLT (ELECTROSURGICAL) ×3
ELECTRODE REM PT RTRN 9FT ADLT (ELECTROSURGICAL) ×1 IMPLANT
GAUZE SPONGE 4X4 12PLY STRL (GAUZE/BANDAGES/DRESSINGS) ×3 IMPLANT
GLOVE BIO SURGEON STRL SZ7.5 (GLOVE) ×3 IMPLANT
GLOVE BIOGEL PI IND STRL 8 (GLOVE) ×1 IMPLANT
GLOVE BIOGEL PI INDICATOR 8 (GLOVE) ×2
GLOVE SURG SS PI 7.5 STRL IVOR (GLOVE) ×3 IMPLANT
GOWN STRL REUS W/ TWL LRG LVL3 (GOWN DISPOSABLE) ×1 IMPLANT
GOWN STRL REUS W/ TWL XL LVL3 (GOWN DISPOSABLE) ×3 IMPLANT
GOWN STRL REUS W/TWL LRG LVL3 (GOWN DISPOSABLE) ×3
GOWN STRL REUS W/TWL XL LVL3 (GOWN DISPOSABLE) ×9
KIT BASIN OR (CUSTOM PROCEDURE TRAY) ×3 IMPLANT
KIT TURNOVER KIT B (KITS) ×3 IMPLANT
MANIFOLD NEPTUNE II (INSTRUMENTS) ×3 IMPLANT
NEEDLE BIOPSY JAMSHIDI 8X6 (NEEDLE) IMPLANT
NEEDLE HYPO 25GX1X1/2 BEV (NEEDLE) IMPLANT
NS IRRIG 1000ML POUR BTL (IV SOLUTION) ×3 IMPLANT
PACK ORTHO EXTREMITY (CUSTOM PROCEDURE TRAY) ×3 IMPLANT
PAD ARMBOARD 7.5X6 YLW CONV (MISCELLANEOUS) ×6 IMPLANT
PROBE DEBRIDE SONICVAC MISONIX (TIP) ×3 IMPLANT
SET CYSTO W/LG BORE CLAMP LF (SET/KITS/TRAYS/PACK) ×3 IMPLANT
SOL PREP POV-IOD 4OZ 10% (MISCELLANEOUS) ×6 IMPLANT
STAPLER VISISTAT 35W (STAPLE) ×3 IMPLANT
SUT ETHILON 2 0 FS 18 (SUTURE) ×3 IMPLANT
SYR CONTROL 10ML LL (SYRINGE) IMPLANT
TOWEL GREEN STERILE (TOWEL DISPOSABLE) ×3 IMPLANT
TOWEL GREEN STERILE FF (TOWEL DISPOSABLE) ×3 IMPLANT
TUBE CONNECTING 12'X1/4 (SUCTIONS) ×1
TUBE CONNECTING 12X1/4 (SUCTIONS) ×2 IMPLANT
TUBE IRRIGATION SET MISONIX (TUBING) ×3 IMPLANT
YANKAUER SUCT BULB TIP NO VENT (SUCTIONS) ×3 IMPLANT

## 2020-07-14 NOTE — Discharge Instructions (Signed)
Post-Surgery Instructions  1. If you are recuperating from surgery anywhere other than home, please be sure to leave [place] a number where you can be reached. 2. Go directly home and rest. 3. The keep operated foot (or feet) elevated six inches above the hip when sitting or lying down. 4. Support the elevated foot and leg with pillows under the calf. DO NOT PLACE PILLOWS UNDER THE KNEE. 5. DO NOT REMOVE or get your bandages wet. This will increase your chances of getting an infection. 6. Wear your surgical shoe at all times when you are up. 7. A limited amount of pain and swelling may occur. The skin may take on a bruised appearance. This is no cause for alarm. 8. For slight pain and swelling, apply an ice pack directly over the bandage for 15 minutes every hour. Continue icing until seen in the office. DO NOT apply any form of heat to the area. 9. Have prescription(s) filled immediately and take as directed. 10. Drink lots of liquids, water, and juice. 11. CALL THE OFFICE IMMEDIATELY IF: a. Bleeding continues b. Pain increases and/or does not respond to medication c. Bandage or cast appears too tight d. Any liquids (water, coffee, etc.) have spilled on your bandages. e. Tripping, falling, or stubbing the surgical foot f. If your temperature rises above 101 g. If you have ANY questions at all 12.  You are expected to be weightbearing as tolerated in your surgical shoe.  If you need to reach the nurse for any reason, please call: [place]: [phone] [place]: [phone] [place]: [phone]

## 2020-07-14 NOTE — Anesthesia Procedure Notes (Signed)
Anesthesia Regional Block: Popliteal block   Pre-Anesthetic Checklist: ,, timeout performed, Correct Patient, Correct Site, Correct Laterality, Correct Procedure, Correct Position, site marked, Risks and benefits discussed,  Surgical consent,  Pre-op evaluation,  At surgeon's request and post-op pain management  Laterality: Left  Prep: chloraprep       Needles:  Injection technique: Single-shot  Needle Type: Echogenic Needle     Needle Length: 9cm  Needle Gauge: 21     Additional Needles:   Procedures:,,,, ultrasound used (permanent image in chart),,,,  Narrative:  Start time: 07/14/2020 12:56 PM End time: 07/14/2020 1:06 PM Injection made incrementally with aspirations every 5 mL.  Performed by: Personally  Anesthesiologist: Cecile Hearing, MD  Additional Notes: No pain on injection. No increased resistance to injection. Injection made in 5cc increments.  Good needle visualization.  Patient tolerated procedure well.

## 2020-07-14 NOTE — Op Note (Signed)
Patient Name: [name] DOB: 1971/07/27  MRN: [id number]   Date of Service: 07/14/20   Surgeon: Dr. [name], DPM Assistants: None Pre-operative Diagnosis: Diabetic ulcer left foot Post-operative Diagnosis: same Procedures:             1) Debridement and irrigation of left foot wound  2) Application of wound graft substitute Pathology/Specimens: * No specimens in log * Anesthesia: Regional Hemostasis: Anatomic Estimated Blood Loss: 5 ml Materials: None Medications: 1g vancomycin powder topical Complications: None  Indications for Procedure:  This is a 49 y.o. male with a chronic left foot wound   Procedure in Detail: Patient was identified in pre-operative holding area. Formal consent was signed and the left lower extremity was marked. Patient was brought back to the operating room and placed on the operating room table in the supine position. Anesthesia was induced.   The extremity was prepped and draped in the usual sterile fashion. Timeout was taken to confirm patient name, laterality, and procedure prior to incision. Attention was then directed to the left foot where a wound measuring 3x2 was encountered.  The wound was sharply excisionally debrided with a 15 blade, followed by a misonix ultrasonic debrider. Debridement was performed to bleeding, viable wound base. The wound was debrided to the level of the capsular tissue. Following debridement the wound measured 4x3. 2-0 Nylon retention sutures were placed. The wound then measured 4x2. Vancomycin powder was applied topically. A 5x5 cm Integra bilayer graft was meshed and applied to the wound and adhered with staples.   The foot was then dressed with 4x4, kerlix, and ACE bandage. Patient tolerated the procedure well.   Disposition: Following a period of post-operative monitoring, patient will be transferred back home.

## 2020-07-14 NOTE — Transfer of Care (Signed)
Immediate Anesthesia Transfer of Care Note  Patient: [name]  Procedure(s) Performed: IRRIGATION AND DEBRIDEMENT LEFT FOOT (Left ) APPLICATION OF SKIN GRAFT USING INTEGRA BILAYER MATRIX WOUND DRESSING (Left )  Patient Location: PACU  Anesthesia Type:MAC combined with regional for post-op pain  Level of Consciousness: awake, alert  and oriented  Airway & Oxygen Therapy: Patient Spontanous Breathing  Post-op Assessment: Report given to RN  Post vital signs: Reviewed and stable  Last Vitals:  Vitals Value Taken Time  BP 148/102 07/14/20 1620  Temp    Pulse 75 07/14/20 1627  Resp 14 07/14/20 1627  SpO2 100 % 07/14/20 1627  Vitals shown include unvalidated device data.  Last Pain:  Vitals:   07/14/20 1210  TempSrc:   PainSc: 0-No pain      Patients Stated Pain Goal: 3 (66/06/00 4599)  Complications: No complications documented.

## 2020-07-14 NOTE — Anesthesia Procedure Notes (Signed)
Procedure Name: MAC Date/Time: 07/14/2020 3:45 PM Performed by: Eligha Bridegroom, CRNA Pre-anesthesia Checklist: Emergency Drugs available, Suction available, Patient identified, Patient being monitored and Timeout performed Patient Re-evaluated:Patient Re-evaluated prior to induction Oxygen Delivery Method: Nasal cannula Preoxygenation: Pre-oxygenation with 100% oxygen

## 2020-07-14 NOTE — Anesthesia Postprocedure Evaluation (Signed)
Anesthesia Post Note  Patient: [name]  Procedure(s) Performed: IRRIGATION AND DEBRIDEMENT LEFT FOOT (Left ) APPLICATION OF SKIN GRAFT USING INTEGRA BILAYER MATRIX WOUND DRESSING (Left )     Patient location during evaluation: PACU Anesthesia Type: Regional Level of consciousness: awake and alert Pain management: pain level controlled Vital Signs Assessment: post-procedure vital signs reviewed and stable Respiratory status: spontaneous breathing, nonlabored ventilation, respiratory function stable and patient connected to nasal cannula oxygen Cardiovascular status: stable and blood pressure returned to baseline Postop Assessment: no apparent nausea or vomiting Anesthetic complications: no   No complications documented.  Last Vitals:  Vitals:   07/14/20 1620 07/14/20 1635  BP: (!) 148/102 (!) 135/102  Pulse:  75  Resp: 18 15  Temp: 36.4 C 36.4 C  SpO2: 100% 100%    Last Pain:  Vitals:   07/14/20 1635  TempSrc:   PainSc: 0-No pain                 Cecile Hearing

## 2020-07-14 NOTE — Brief Op Note (Signed)
07/14/2020  4:18 PM  PATIENT:  [name]  49 y.o. male  PRE-OPERATIVE DIAGNOSIS:  DIABETIC ULCER OF LEFT FOOT  POST-OPERATIVE DIAGNOSIS:  DIABETIC ULCER OF LEFT FOOT  PROCEDURE:  Procedure(s): IRRIGATION AND DEBRIDEMENT LEFT FOOT (Left) APPLICATION OF SKIN GRAFT USING INTEGRA BILAYER MATRIX WOUND DRESSING (Left)  SURGEON:  Surgeon(s) and Role:    * Park Liter, DPM - Primary  PHYSICIAN ASSISTANT:   ASSISTANTS: none   ANESTHESIA:   regional  EBL:  5 ml   BLOOD ADMINISTERED:none  DRAINS: none   LOCAL MEDICATIONS USED:  none  SPECIMEN:  none  DISPOSITION OF SPECIMEN:  n/a  COUNTS:  YES  TOURNIQUET:  * No tourniquets in log *  DICTATION: .Dragon Dictation  PLAN OF CARE: Discharge to home after PACU  PATIENT DISPOSITION:  PACU - hemodynamically stable.   Delay start of Pharmacological VTE agent (>24hrs) due to surgical blood loss or risk of bleeding: not applicable

## 2020-07-14 NOTE — Interval H&P Note (Signed)
History and Physical Interval Note:  07/14/2020 3:31 PM  [name]  has presented today for surgery, with the diagnosis of DIABETIC ULCER OF LEFT FOOT.  The various methods of treatment have been discussed with the patient and family. After consideration of risks, benefits and other options for treatment, the patient has consented to  Procedure(s): IRRIGATION AND DEBRIDEMENT LEFT FOOT (Left) APPLICATION OF SKIN GRAFT (Left) as a surgical intervention.  The patient's history has been reviewed, patient examined, no change in status, stable for surgery.  I have reviewed the patient's chart and labs.  Questions were answered to the patient's satisfaction.     Park Liter

## 2020-07-17 ENCOUNTER — Encounter (HOSPITAL_COMMUNITY): Payer: Self-pay | Admitting: Podiatry

## 2020-07-17 MED FILL — FUROSEMIDE 40 MG TAB: 40 | 30 days supply | Qty: 30 | Fill #3

## 2020-07-17 MED FILL — DIGOXIN 0.125 MG TABLET: 125 | 30 days supply | Qty: 30 | Fill #1

## 2020-07-17 MED FILL — ATORVASTATIN 80 MG TABLET: 80 | 30 days supply | Qty: 30 | Fill #3

## 2020-07-20 ENCOUNTER — Ambulatory Visit (INDEPENDENT_AMBULATORY_CARE_PROVIDER_SITE_OTHER): Payer: Self-pay | Admitting: Podiatry

## 2020-07-20 ENCOUNTER — Encounter: Payer: Self-pay | Admitting: Podiatry

## 2020-07-20 ENCOUNTER — Other Ambulatory Visit: Payer: Self-pay

## 2020-07-20 DIAGNOSIS — L97421 Non-pressure chronic ulcer of left heel and midfoot limited to breakdown of skin: Secondary | ICD-10-CM

## 2020-07-20 DIAGNOSIS — E08621 Diabetes mellitus due to underlying condition with foot ulcer: Secondary | ICD-10-CM

## 2020-07-20 MED ORDER — AMOXICILLIN-POT CLAVULANATE 875-125 MG PO TABS: 1.0000 | ORAL_TABLET | Freq: Two times a day (BID) | ORAL | 0 refills | Status: DC

## 2020-07-20 MED FILL — AMOX-CLAV 875-125 MG TABLET: 875-125 | 10 days supply | Qty: 20 | Fill #0

## 2020-07-20 NOTE — Progress Notes (Signed)
  Subjective:  Patient ID: [name], male    DOB: [birth year],  MRN: [id number]  Chief Complaint  Patient presents with  . Wound Check    POV #1 DOS 07/14/2020 LT FOOT DEBRIDEMENT & IRRIGATION, APPLICATION OF SKIN GRAFT SUBSTIITUTE. Pt stated, "I noticed clear drainage through the ace wrap yesterday. I've kept the dressing dry and intact. The drainage didn't have an odor. Only mild pain. I have 3 days left on the antibiotics". No fever/chills/N&V.    49 y.o. male presents for wound care. Hx confirmed with patient.  Has left the dressing intact denies concerns. Objective:  Physical Exam: Wound Location: Left fifth ray resection  Wound Measurement UTA Wound Base: Mixed Granular/Fibrotic Peri-wound: Normal Exudate: None: wound tissue dry wound without warmth, erythema, signs of acute infection  Assessment:   1. Diabetic ulcer of left midfoot associated with diabetes mellitus due to underlying condition, limited to breakdown of skin [facility])    Plan:  Patient was evaluated and treated and all questions answered.  Ulcer left 5th ray resection -Wound well appearing, likely post-op seroma. Additional meshing of the graft performed with 15 blade. Bolster dressing applied. -Refill abx x1 week  -No need for pain medication at this time -Recommend patient stay out of work to promote healing.  Return in about 1 week (around 07/27/2020) for Wound Care.

## 2020-07-21 ENCOUNTER — Encounter: Payer: Self-pay | Admitting: Podiatry

## 2020-07-27 ENCOUNTER — Inpatient Hospital Stay: Payer: Self-pay | Admitting: Family Medicine

## 2020-07-27 ENCOUNTER — Ambulatory Visit (INDEPENDENT_AMBULATORY_CARE_PROVIDER_SITE_OTHER): Payer: Self-pay | Admitting: Podiatry

## 2020-07-27 ENCOUNTER — Encounter: Payer: Self-pay | Admitting: Podiatry

## 2020-07-27 ENCOUNTER — Other Ambulatory Visit: Payer: Self-pay

## 2020-07-27 DIAGNOSIS — L97421 Non-pressure chronic ulcer of left heel and midfoot limited to breakdown of skin: Secondary | ICD-10-CM

## 2020-07-27 DIAGNOSIS — E08621 Diabetes mellitus due to underlying condition with foot ulcer: Secondary | ICD-10-CM

## 2020-07-27 NOTE — Progress Notes (Signed)
  Subjective:  Patient ID: [name], male    DOB: [birth year],  MRN: [id number]  Chief Complaint  Patient presents with  . Routine Post Op    i have some draining on the left foot and red color   49 y.o. male presents for wound care. Hx confirmed with patient.  Reports continued quarter size drainage through the wound, denies nausea vomiting fever chills.. Date of graft application 07/14/20. Objective:  Physical Exam: Wound Location: Left fifth ray resection  Wound Measurement UTA Wound Base: Mixed Granular/Fibrotic Peri-wound: Normal Exudate: None: wound tissue dry wound without warmth, erythema, signs of acute infection  Redness at the medial margin of the wound around the fourth toe without active warmth.  Serous drainage no purulence.  Retention sutures and staples intact  Assessment:   1. Diabetic ulcer of left midfoot associated with diabetes mellitus due to underlying condition, limited to breakdown of skin [facility])    Plan:  Patient was evaluated and treated and all questions answered.  Ulcer left 5th ray resection -Wound continues to be well-appearing without signs of infection.  The proximal margins of the wound do appear to be coming together.  The silicone layer of the graft does appear to be partially separating from the wound bed.  Chronic redness around the medial aspect of the wound with continued absence of warmth.  I believe this is chronic and not signs of infection.  The wound was dressed with Adaptic, fluffed 4 x 4's, Kerlix, Ace bandage. -Continue antibiotics to completion -Recommend patient stay out of work to promote healing. -Follow-up in 1 week for wound check  Return in about 1 week (around 08/03/2020).

## 2020-07-27 NOTE — Addendum Note (Signed)
Addended by: [name] on: 07/27/2020 05:31 PM   Modules accepted: Level of Service

## 2020-07-28 ENCOUNTER — Encounter: Payer: Self-pay | Admitting: Podiatry

## 2020-07-28 ENCOUNTER — Ambulatory Visit: Payer: Self-pay | Admitting: Cardiovascular Disease

## 2020-08-03 ENCOUNTER — Ambulatory Visit (INDEPENDENT_AMBULATORY_CARE_PROVIDER_SITE_OTHER): Payer: Self-pay | Admitting: Podiatry

## 2020-08-03 ENCOUNTER — Other Ambulatory Visit: Payer: Self-pay

## 2020-08-03 DIAGNOSIS — L97512 Non-pressure chronic ulcer of other part of right foot with fat layer exposed: Secondary | ICD-10-CM

## 2020-08-03 DIAGNOSIS — L97421 Non-pressure chronic ulcer of left heel and midfoot limited to breakdown of skin: Secondary | ICD-10-CM

## 2020-08-03 DIAGNOSIS — L03115 Cellulitis of right lower limb: Secondary | ICD-10-CM

## 2020-08-03 MED ORDER — CLINDAMYCIN HCL 300 MG PO CAPS: 300.0000 mg | ORAL_CAPSULE | Freq: Three times a day (TID) | ORAL | 0 refills | Status: DC

## 2020-08-03 MED ORDER — SILVER SULFADIAZINE 1 % EX CREA
1.0000 | TOPICAL_CREAM | Freq: Every day | CUTANEOUS | 0 refills | Status: DC
Start: 2020-08-03 — End: 2020-09-28

## 2020-08-03 MED FILL — DIGOXIN 0.125 MG TABLET: 125 | 30 days supply | Qty: 30 | Fill #2

## 2020-08-03 MED FILL — SSD 1% CREAM: 1 | 30 days supply | Qty: 50 | Fill #0

## 2020-08-03 MED FILL — OMEPRAZOLE DR 20 MG CAPSULE: 20 | 30 days supply | Qty: 30 | Fill #0

## 2020-08-03 MED FILL — CLINDAMYCIN HCL 300 MG CAPS: 300 | 7 days supply | Qty: 21 | Fill #0

## 2020-08-03 MED FILL — SPIRONOLACTONE 50 MG TABS: 50 | 30 days supply | Qty: 30 | Fill #2

## 2020-08-03 MED FILL — CARVEDILOL 6.25 MG TABLET: 6.25 | 30 days supply | Qty: 60 | Fill #1

## 2020-08-03 NOTE — Progress Notes (Signed)
Subjective: 49 year old male presents the office today for follow evaluation after undergoing left foot wound debridement, Integra graft application performed by Dr. Samuella Cota on 07/14/2020.  He states that sides been doing well however he is concerned about the right side as he noticed a blister recently increased redness.  He gets pain about 3/10.  He noted some clear drainage coming from the area.  No pus.  No red streaking.  He has been applying Betadine and a dressing. Denies any systemic complaints such as fevers, chills, nausea, vomiting. No acute changes since last appointment, and no other complaints at this time.   Last A1c was 9.1.  Last blood sugar was 125.  Objective: AAO x3, NAD DP/PT pulses palpable bilaterally, CRT less than 3 seconds Protective sensation decreased with Simms Weinstein monofilament LEFT foot: Ulceration noted to the lateral aspect of the foot with Integra graft intact and appears to be incorporated into the wound.  There is faint erythema but there is no drainage or pus there is no ascending cellulitis.  There is no fluctuation crepitation there is no malodor. RIGHT foot: Hyperkeratotic tissue blister evident submetatarsal 5 on the right foot with surrounding erythema extending approximately 1 cm from the site.  There is no streaking.  Upon debridement there is a granular ulceration present measuring approximately 1 x 1 cm with a depth of 0.2.  There is no fluctuation or crepitation. No pain with calf compression, swelling, warmth, erythema  Assessment: Ulceration right foot with localized cellulitis; chronic wound left foot  Plan: -All treatment options discussed with the patient including all alternatives, risks, complications.  -For the left foot seems to be doing well.  Adaptic was applied followed by a dry sterile dressing.  Faint erythema will start antibiotics to the right side -On the right side sharply debrided the callus, blister without any complications  however did reveal underlying ulceration.  Debrided nonviable tissue utilizing the 312 with scalpel to any complications.  We will do Silvadene cream which I prescribed as well as clindamycin discussed side effects.  Daily dressing changes.  Wound cultures obtained today.  Encourage surgical shoe which she has at home. -Patient encouraged to call the office with any questions, concerns, change in symptoms.   Return in about 1 week (around 08/10/2020).  Vivi Barrack DPM

## 2020-08-06 LAB — WOUND CULTURE
MICRO NUMBER:: 10792041
SPECIMEN QUALITY:: ADEQUATE

## 2020-08-08 ENCOUNTER — Other Ambulatory Visit: Payer: Self-pay | Admitting: Podiatry

## 2020-08-08 ENCOUNTER — Telehealth: Payer: Self-pay | Admitting: *Deleted

## 2020-08-08 MED ORDER — CIPROFLOXACIN HCL 500 MG PO TABS: 500.0000 mg | ORAL_TABLET | Freq: Two times a day (BID) | ORAL | 0 refills | Status: DC

## 2020-08-08 MED FILL — CIPROFLOXACIN HCL 500 MG TA: 500 | 10 days supply | Qty: 20 | Fill #0

## 2020-08-08 NOTE — Telephone Encounter (Signed)
I informed pt of Dr. Gabriel Rung orders. Pt asked if he was to take with the current medication he has 2-3 days left. I asked Dr. Ardelle Anton and he states pt can continue the clindamycin with the cipro just take at different times.

## 2020-08-08 NOTE — Telephone Encounter (Signed)
I informed pt of Dr. Wagoner's orders. 

## 2020-08-08 NOTE — Telephone Encounter (Signed)
-----   Message from Vivi Barrack, DPM sent at 08/08/2020 12:05 PM EDT ----- Please let him know that the culture did grow bacteria and that I have sent cipro to the pharmacy for him to take as well.

## 2020-08-11 ENCOUNTER — Ambulatory Visit (INDEPENDENT_AMBULATORY_CARE_PROVIDER_SITE_OTHER): Payer: No Typology Code available for payment source | Admitting: Podiatry

## 2020-08-11 ENCOUNTER — Other Ambulatory Visit: Payer: Self-pay

## 2020-08-11 DIAGNOSIS — L97512 Non-pressure chronic ulcer of other part of right foot with fat layer exposed: Secondary | ICD-10-CM

## 2020-08-11 DIAGNOSIS — E08621 Diabetes mellitus due to underlying condition with foot ulcer: Secondary | ICD-10-CM | POA: Diagnosis not present

## 2020-08-11 DIAGNOSIS — L97421 Non-pressure chronic ulcer of left heel and midfoot limited to breakdown of skin: Secondary | ICD-10-CM

## 2020-08-11 DIAGNOSIS — L03115 Cellulitis of right lower limb: Secondary | ICD-10-CM | POA: Diagnosis not present

## 2020-08-14 MED ORDER — SANTYL 250 UNIT/GM EX OINT
1.0000 | TOPICAL_OINTMENT | Freq: Every day | CUTANEOUS | 0 refills | Status: DC
Start: 2020-08-14 — End: 2020-08-21

## 2020-08-14 NOTE — Progress Notes (Signed)
  Subjective:  Patient ID: [name], male    DOB: 21-Sep-1971,  MRN: [id number]  Chief Complaint  Patient presents with  . Diabetic Ulcer    Bilateral. Pt stated, "They're doing well. Finished one antibiotic. I changed the dressing on the L foot on Wednesday (2 days ago)". No fever/chills/N&V/pus/foul odor.    49 y.o. male presents for wound care. Hx confirmed with patient. Thinks the right is doing better as well. Objective:  Physical Exam: Wound Location: left lateral foot Wound Measurement: 2x3 Wound Base: Granular/Healthy Peri-wound: Normal Exudate: Scant/small amount Serosanguinous exudate wound without warmth, erythema, signs of acute infection    1*3 post tightening  Right foot improved, now without open ulcer. No warmth, erythema, signs of infection Assessment:   1. 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 Cellulitis of right foot   3. Ulcerated, foot, right, with fat layer exposed (HCC)    Plan:  Patient was evaluated and treated and all questions answered.  Ulcer left lateral foot -Graft loose, removed today -Wound well appearing, improving, size decreasing -Suture ends tied to tighten to bring edges closer together -Start santyl. Rx snt to [facility] -Continue silvadene until santyl received.   Right foot ulcer -No open ulcer noted  No follow-ups on file.

## 2020-08-15 ENCOUNTER — Encounter: Payer: Self-pay | Admitting: Podiatry

## 2020-08-15 ENCOUNTER — Other Ambulatory Visit: Payer: Self-pay

## 2020-08-15 ENCOUNTER — Ambulatory Visit (INDEPENDENT_AMBULATORY_CARE_PROVIDER_SITE_OTHER): Payer: No Typology Code available for payment source | Admitting: Podiatry

## 2020-08-15 ENCOUNTER — Other Ambulatory Visit (HOSPITAL_COMMUNITY): Payer: Self-pay | Admitting: Cardiology

## 2020-08-15 DIAGNOSIS — E08621 Diabetes mellitus due to underlying condition with foot ulcer: Secondary | ICD-10-CM | POA: Diagnosis not present

## 2020-08-15 DIAGNOSIS — L97421 Non-pressure chronic ulcer of left heel and midfoot limited to breakdown of skin: Secondary | ICD-10-CM

## 2020-08-15 MED FILL — $LANTUS SOLOSTAR 100 UNITS/: 100 | 90 days supply | Qty: 18 | Fill #4

## 2020-08-15 MED FILL — glipiZIDE 10 MG TABS: 10 | 30 days supply | Qty: 60 | Fill #0

## 2020-08-15 MED FILL — ATORVASTATIN 80 MG TABLET: 80 | 30 days supply | Qty: 30 | Fill #0

## 2020-08-15 NOTE — Progress Notes (Signed)
  Subjective:  Patient ID: [name], male    DOB: 1971/07/05,  MRN: [id number]  Chief Complaint  Patient presents with  . Routine Post Op    POV#4 Pt states no concerns 'It looks the same'. Denies fever/chills/nausea/vomiting.    49 y.o. male presents for wound care. Hx confirmed with patient.  Objective:  Physical Exam: Wound Location: left lateral foot Wound Measurement: 2.5x1 Wound Base: Granular/Healthy Peri-wound: Normal Exudate: Scant/small amount Serosanguinous exudate wound without warmth, erythema, signs of acute infection  Right foot improved, now with small 0.5x0.5 ulcer. No warmth, erythema, signs of infection Assessment:   1. Diabetic ulcer of left midfoot associated with diabetes mellitus due to underlying condition, limited to breakdown of skin [facility])    Plan:  Patient was evaluated and treated and all questions answered.  Ulcer left lateral foot -Graft loose, removed today -Wound well appearing, improving, size decreasing -Suture ends tied to tighten to bring edges closer together -Start santyl. Rx snt to [facility] -Continue silvadene until santyl received.  Right foot ulcer -Small ulcer, debrided, dressed with silver nitrate  No follow-ups on file.

## 2020-08-17 ENCOUNTER — Other Ambulatory Visit (HOSPITAL_COMMUNITY): Payer: Self-pay | Admitting: Cardiology

## 2020-08-17 MED FILL — FUROSEMIDE 40 MG TAB: 40 | 30 days supply | Qty: 30 | Fill #0

## 2020-08-21 ENCOUNTER — Telehealth: Payer: Self-pay | Admitting: Podiatry

## 2020-08-21 MED ORDER — SANTYL 250 UNIT/GM EX OINT: 1.0000 "application " | TOPICAL_OINTMENT | Freq: Every day | CUTANEOUS | 0 refills | Status: DC

## 2020-08-21 NOTE — Addendum Note (Signed)
Addended by: [name] on: 08/21/2020 11:45 AM   Modules accepted: Orders

## 2020-08-21 NOTE — Telephone Encounter (Signed)
Pt called and stated that he was waiting on some information for a cream for his wound please advise

## 2020-08-21 NOTE — Telephone Encounter (Signed)
Medication was sent on the 16th but re-sent to a different [facility] pharmacy that should have it in stock and be able to fill it

## 2020-08-23 ENCOUNTER — Telehealth (HOSPITAL_COMMUNITY): Payer: Self-pay | Admitting: Pharmacy Technician

## 2020-08-23 ENCOUNTER — Telehealth (HOSPITAL_COMMUNITY): Payer: Self-pay | Admitting: Cardiology

## 2020-08-23 NOTE — Telephone Encounter (Signed)
Pt has a lab appt scheduled Friday, he is asking if you can pull labs from when he was in the hospital, or [facility] wants to ck different labs, if so can he r/s until next week, he just went back to work.pt can be reached @[phone] , please advise

## 2020-08-23 NOTE — Telephone Encounter (Signed)
Received a letter from [employer] stating that the patient was re-enrolling for assistance and they needed an updated RX. Went ahead and had the provider sign the provider portion.  Realize that patient needs a PA on [employer]. That was submitted to CoverMyMeds.  Key:  [id number]  Will send application in after PA is approved or denied.  Will follow up.

## 2020-08-23 NOTE — Telephone Encounter (Signed)
Unable to see labs recently done during hospital visit. Ok for patient to reschedule     [facility]

## 2020-08-25 ENCOUNTER — Other Ambulatory Visit (HOSPITAL_COMMUNITY): Payer: Self-pay

## 2020-08-25 ENCOUNTER — Ambulatory Visit (INDEPENDENT_AMBULATORY_CARE_PROVIDER_SITE_OTHER): Payer: No Typology Code available for payment source | Admitting: Podiatry

## 2020-08-25 ENCOUNTER — Other Ambulatory Visit: Payer: Self-pay

## 2020-08-25 DIAGNOSIS — L97421 Non-pressure chronic ulcer of left heel and midfoot limited to breakdown of skin: Secondary | ICD-10-CM

## 2020-08-25 DIAGNOSIS — E08621 Diabetes mellitus due to underlying condition with foot ulcer: Secondary | ICD-10-CM | POA: Diagnosis not present

## 2020-08-25 DIAGNOSIS — L97511 Non-pressure chronic ulcer of other part of right foot limited to breakdown of skin: Secondary | ICD-10-CM | POA: Diagnosis not present

## 2020-08-27 NOTE — Progress Notes (Signed)
  Subjective:  Patient ID: [name], male    DOB: 06-Jul-1971,  MRN: [id number]  Chief Complaint  Patient presents with  . Routine Post Op    POV#5 DOS 7.16.2021 LT FOOT DEBRIDEMENT AND IRRIGATION, APPLICATION OF SKIN GRAFT SUBSTITUTE. Pt states no concerns, healing well.  . Wound Check    Right 5th/4th interdigital wound, pt states noticed 2 days ago and has been applying silvadene.     49 y.o. male presents for wound care. Hx confirmed with patient. Thinks the right is doing better as well. Objective:  Physical Exam: Wound Location: left lateral foot Wound Measurement: 2x1.5 Wound Base: Granular/Healthy Peri-wound: Normal Exudate: Scant/small amount Serosanguinous exudate wound without warmth, erythema, signs of acute infection  Right 4th toe wound without capsule involvement. No warmth erythema signs of infection Assessment:   1. 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 Diabetic ulcer of other part of right foot associated with diabetes mellitus due to underlying condition, limited to breakdown of skin [facility])    Plan:  Patient was evaluated and treated and all questions answered.  Ulcer left lateral foot -Wound improving. -Wound debrided today -Continue surgical shoe, new shoe dispensed changed out of forefoot offloading shoe. -Continue silvadene  Procedure: Selective Debridement of Wound Rationale: Removal of devitalized tissue from the wound to promote healing.  Pre-Debridement Wound Measurements: 2 cm x 1.5 cm x 0.2 cm  Post-Debridement Wound Measurements: same as pre-debridement. Type of Debridement: sharp selective Tissue Removed: Devitalized soft-tissue Dressing: Dry, sterile, compression dressing. Disposition: Patient tolerated procedure well. Patient to return in 1 week for follow-up.    Right foot ulcer -Dressed with silvadene, well padded bandage. -Advised to  pad well interdigitally -Dispense surgical shoe  No follow-ups on file.

## 2020-08-28 ENCOUNTER — Telehealth: Payer: Self-pay | Admitting: Podiatry

## 2020-08-28 MED FILL — CARVEDILOL 6.25 MG TABLET: 6.25 | 30 days supply | Qty: 60 | Fill #2

## 2020-08-28 MED FILL — ATORVASTATIN 80 MG TABLET: 80 | 30 days supply | Qty: 30 | Fill #0

## 2020-08-28 MED FILL — FUROSEMIDE 40 MG TAB: 40 | 30 days supply | Qty: 30 | Fill #0

## 2020-08-28 MED FILL — SPIRONOLACTONE 50 MG TABS: 50 | 30 days supply | Qty: 30 | Fill #1

## 2020-08-28 NOTE — Telephone Encounter (Signed)
Called and advised patient to pick it up. Patient states copay was $50 and didn't mind it if it was needed

## 2020-08-28 NOTE — Telephone Encounter (Signed)
[name] called and wanted to know if Dr. Samuella Cota still wants him to pick up "high dollar cream" for his foot. Patient does not know name of cream, but chart review indicates Santyl was prescribed.

## 2020-08-30 ENCOUNTER — Other Ambulatory Visit: Payer: Self-pay

## 2020-08-30 ENCOUNTER — Other Ambulatory Visit: Payer: Self-pay | Admitting: Family Medicine

## 2020-08-30 ENCOUNTER — Ambulatory Visit: Payer: No Typology Code available for payment source | Attending: Family Medicine | Admitting: Family Medicine

## 2020-08-30 ENCOUNTER — Encounter: Payer: Self-pay | Admitting: Family Medicine

## 2020-08-30 VITALS — BP 123/81 | HR 72 | Ht 78.0 in | Wt 269.0 lb

## 2020-08-30 DIAGNOSIS — I10 Essential (primary) hypertension: Secondary | ICD-10-CM

## 2020-08-30 DIAGNOSIS — E669 Obesity, unspecified: Secondary | ICD-10-CM

## 2020-08-30 DIAGNOSIS — E11621 Type 2 diabetes mellitus with foot ulcer: Secondary | ICD-10-CM | POA: Diagnosis not present

## 2020-08-30 DIAGNOSIS — I2510 Atherosclerotic heart disease of native coronary artery without angina pectoris: Secondary | ICD-10-CM | POA: Diagnosis not present

## 2020-08-30 DIAGNOSIS — E1169 Type 2 diabetes mellitus with other specified complication: Secondary | ICD-10-CM

## 2020-08-30 DIAGNOSIS — L97425 Non-pressure chronic ulcer of left heel and midfoot with muscle involvement without evidence of necrosis: Secondary | ICD-10-CM

## 2020-08-30 LAB — POCT GLYCOSYLATED HEMOGLOBIN (HGB A1C): HbA1c, POC (controlled diabetic range): 6.9 % (ref 0.0–7.0)

## 2020-08-30 LAB — GLUCOSE, POCT (MANUAL RESULT ENTRY): POC Glucose: 114 mg/dl — AB (ref 70–99)

## 2020-08-30 MED ORDER — DAPAGLIFLOZIN PROPANEDIOL 10 MG PO TABS: 10.0000 mg | ORAL_TABLET | Freq: Every day | ORAL | 6 refills | Status: DC

## 2020-08-30 MED ORDER — ATORVASTATIN CALCIUM 80 MG PO TABS: 80.0000 mg | ORAL_TABLET | Freq: Every evening | ORAL | 6 refills | Status: DC

## 2020-08-30 MED ORDER — GLIPIZIDE 10 MG PO TABS: 10.0000 mg | ORAL_TABLET | Freq: Two times a day (BID) | ORAL | 6 refills | Status: DC

## 2020-08-30 MED ORDER — LANTUS SOLOSTAR 100 UNIT/ML ~~LOC~~ SOPN: 20.0000 [IU] | PEN_INJECTOR | Freq: Every day | SUBCUTANEOUS | 6 refills | Status: DC

## 2020-08-30 NOTE — Patient Instructions (Signed)
Diabetes Mellitus and Foot Care Foot care is an important part of your health, especially when you have diabetes. Diabetes may cause you to have problems because of poor blood flow (circulation) to your feet and legs, which can cause your skin to:  Become thinner and drier.  Break more easily.  Heal more slowly.  Peel and crack. You may also have nerve damage (neuropathy) in your legs and feet, causing decreased feeling in them. This means that you may not notice minor injuries to your feet that could lead to more serious problems. Noticing and addressing any potential problems early is the best way to prevent future foot problems. How to care for your feet Foot hygiene  Wash your feet daily with warm water and mild soap. Do not use hot water. Then, pat your feet and the areas between your toes until they are completely dry. Do not soak your feet as this can dry your skin.  Trim your toenails straight across. Do not dig under them or around the cuticle. File the edges of your nails with an emery board or nail file.  Apply a moisturizing lotion or petroleum jelly to the skin on your feet and to dry, brittle toenails. Use lotion that does not contain alcohol and is unscented. Do not apply lotion between your toes. Shoes and socks  Wear clean socks or stockings every day. Make sure they are not too tight. Do not wear knee-high stockings since they may decrease blood flow to your legs.  Wear shoes that fit properly and have enough cushioning. Always look in your shoes before you put them on to be sure there are no objects inside.  To break in new shoes, wear them for just a few hours a day. This prevents injuries on your feet. Wounds, scrapes, corns, and calluses  Check your feet daily for blisters, cuts, bruises, sores, and redness. If you cannot see the bottom of your feet, use a mirror or ask someone for help.  Do not cut corns or calluses or try to remove them with medicine.  If you  find a minor scrape, cut, or break in the skin on your feet, keep it and the skin around it clean and dry. You may clean these areas with mild soap and water. Do not clean the area with peroxide, alcohol, or iodine.  If you have a wound, scrape, corn, or callus on your foot, look at it several times a day to make sure it is healing and not infected. Check for: ? Redness, swelling, or pain. ? Fluid or blood. ? Warmth. ? Pus or a bad smell. General instructions  Do not cross your legs. This may decrease blood flow to your feet.  Do not use heating pads or hot water bottles on your feet. They may burn your skin. If you have lost feeling in your feet or legs, you may not know this is happening until it is too late.  Protect your feet from hot and cold by wearing shoes, such as at the beach or on hot pavement.  Schedule a complete foot exam at least once a year (annually) or more often if you have foot problems. If you have foot problems, report any cuts, sores, or bruises to your health care provider immediately. Contact a health care provider if:  You have a medical condition that increases your risk of infection and you have any cuts, sores, or bruises on your feet.  You have an injury that is not   healing.  You have redness on your legs or feet.  You feel burning or tingling in your legs or feet.  You have pain or cramps in your legs and feet.  Your legs or feet are numb.  Your feet always feel cold.  You have pain around a toenail. Get help right away if:  You have a wound, scrape, corn, or callus on your foot and: ? You have pain, swelling, or redness that gets worse. ? You have fluid or blood coming from the wound, scrape, corn, or callus. ? Your wound, scrape, corn, or callus feels warm to the touch. ? You have pus or a bad smell coming from the wound, scrape, corn, or callus. ? You have a fever. ? You have a red line going up your leg. Summary  Check your feet every day  for cuts, sores, red spots, swelling, and blisters.  Moisturize feet and legs daily.  Wear shoes that fit properly and have enough cushioning.  If you have foot problems, report any cuts, sores, or bruises to your health care provider immediately.  Schedule a complete foot exam at least once a year (annually) or more often if you have foot problems. This information is not intended to replace advice given to you by your health care provider. Make sure you discuss any questions you have with your health care provider. Document Revised: 09/08/2019 Document Reviewed: 01/17/2017 Elsevier Patient Education  2020 Elsevier Inc.  

## 2020-08-30 NOTE — Progress Notes (Signed)
Subjective:  Patient ID: [name], male    DOB: 01-21-1971  Age: 49 y.o. 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 He is closely followed by treatment foot and ankle as he is status post irrigation and debridement of left foot ulcer. Left foot ulcer is doing better he states and he is wearing a boot; sees podiatry again in 5 days  He has no chest pains, dyspnea, pedal edema.  Compliant with his antihypertensive and also follows up with cardiology. With regards to his diabetes mellitus his A1c is 6.9 which is down from 9.3 previously and he endorses drinking lots of water and eating lots of salads.  Denies hypoglycemic episodes, blurry vision or neuropathy symptoms. He has no additional concerns today.  Past Medical History:  Diagnosis Date  . CAD (coronary artery disease)    NSTEMI 10/2011: LHC 11/04/11: pLAD 90%, mLAD 60-70%, small D2 sub totally occluded at ostium, small OM1 90% ostial, 90% mid, mOM2 30%, oPL 80%, RCA 30%, dPDA 80%, EF 20% with ant AK.  PCI:  Promus DES to pLAD.  [name] CHF (congestive heart failure) (HCC)   . Chronic systolic heart failure (HCC)   . DM2 (diabetes mellitus, type 2) (HCC)   . GERD (gastroesophageal reflux disease)   . HTN (hypertension)   . Hyperlipidemia   . Ischemic cardiomyopathy    Echo 11/03/11: mod LVH, mild focal basal septal hypertrophy, EF 15%, grade 2 diast dysfxn, mild MR, mild to mod LAE, mild RVE, mild to mod reduced RVSF.  EF 3/5 50% by echo  . Obesity     Past Surgical History:  Procedure Laterality Date  . AMPUTATION Left 06/23/2020   Procedure: AMPUTATION RAY 5th;  Surgeon: Vivi Barrack, DPM;  Location: [facility] OR;   Service: Podiatry;  Laterality: Left;  . CARDIAC CATHETERIZATION N/A 10/25/2016   Procedure: Right/Left Heart Cath and Coronary Angiography;  Surgeon: Marykay Lex, MD;  Location: [facility] INVASIVE CV LAB;  Service: Cardiovascular;  Laterality: N/A;  . CARDIAC CATHETERIZATION N/A 10/25/2016   Procedure: Coronary Stent Intervention;  Surgeon: Marykay Lex, MD;  Location: [facility] INVASIVE CV LAB;  Service: Cardiovascular;  Laterality: N/A;  . CARDIAC CATHETERIZATION N/A 10/28/2016   Procedure: Coronary Stent Intervention;  Surgeon: Kathleene Hazel, MD;  Location: [facility] INVASIVE CV LAB;  Service: Cardiovascular;  Laterality: N/A;  . GRAFT APPLICATION Left 07/14/2020   Procedure: APPLICATION OF SKIN GRAFT USING INTE[employer]RA BILAYER MATRIX WOUND DRESSING;  Surgeon: Park Liter, DPM;  Location: [facility] OR;  Service: Podiatry;  Laterality: Left;  . I & D EXTREMITY Left 07/14/2020   Procedure: IRRIGATION AND DEBRIDEMENT LEFT FOOT;  Surgeon: Park Liter, DPM;  Location: [facility] OR;  Service: Podiatry;  Laterality: Left;  . LEFT AND RIGHT HEART CATHETERIZATION WITH CORONARY ANGIOGRAM N/A 11/04/2011   Procedure: LEFT AND RIGHT HEART CATHETERIZATION WITH CORONARY ANGIOGRAM;  Surgeon: Rollene Rotunda, MD;  Location: [facility] CATH LAB;  Service: Cardiovascular;  Laterality: N/A;  . NONE    . PERCUTANEOUS CORONARY STENT INTERVENTION (PCI-S)  11/04/2011   Procedure: PERCUTANEOUS CORONARY STENT INTERVENTION (PCI-S);  Surgeon: Tonny Bollman, MD;  Location: [facility] CATH LAB;  Service: Cardiovascular;;  . WOUND DEBRIDEMENT Left 06/26/2020   Procedure: LEFT FOOT WOUND DEBRIDEMENT AND  GRAFT APPLICATION;  Surgeon: Vivi Barrack, DPM;  Location: [facility] OR;  Service: Podiatry;  Laterality: Left;    Family History  Problem Relation Age of Onset  . Heart disease Father   . Heart disease Mother        MOTHER HAD CABG    No Known Allergies  Outpatient Medications Prior to Visit  Medication Sig Dispense Refill  . aspirin 81 MG chewable  tablet Chew 1 tablet (81 mg total) by mouth daily. 30 tablet 6  . Blood Glucose Monitoring Suppl (TRUE METRIX METER) DEVI 1 each by Does not apply route 3 (three) times daily. 1 Device 0  . carvedilol (COREG) 6.25 MG tablet Take 1 tablet (6.25 mg total) by mouth 2 (two) times daily with a meal. 60 tablet 3  . cetirizine (ZYRTEC) 10 MG tablet Take 1 tablet (10 mg total) by mouth daily. (Patient taking differently: Take 10 mg by mouth daily as needed for allergies. ) 30 tablet 1  . collagenase (SANTYL) ointment Apply 1 application topically daily. 15 g 0  . digoxin (LANOXIN) 0.125 MG tablet TAKE 1 TABLET (125 MCG TOTAL) BY MOUTH DAILY. (Patient taking differently: Take 0.125 mg by mouth daily. ) 30 tablet 2  . furosemide (LASIX) 40 MG tablet TAKE 1 TABLET (40 MG TOTAL) BY MOUTH DAILY. 30 tablet 3  . glucose blood (TRUE METRIX BLOOD GLUCOSE TEST) test strip 1 each by Other route 3 (three) times daily. 100 each 12  . Insulin Pen Needle (TRUEPLUS PEN NEEDLES) 31G X 5 MM MISC USE AS DIRECTED AT BEDTIME. 100 each 6  . Lactobacillus (ACIDOPHILUS PO) Take 2 capsules by mouth daily at 12 noon.    [name] omeprazole (PRILOSEC) 20 MG capsule Take 1 capsule (20 mg total) by mouth daily. 30 capsule 3  . sacubitril-valsartan (ENTRESTO) 97-103 MG Take 1 tablet by mouth 2 (two) times daily. 180 tablet 3  . sildenafil (REVATIO) 20 MG tablet Take 1 tablet (20 mg total) by mouth as needed (for erectile dysfunction). 10 tablet 3  . spironolactone (ALDACTONE) 50 MG tablet Take 1 tablet (50 mg total) by mouth daily. 30 tablet 2  . TRUEplus Lancets 28G MISC 1 each by Does not apply route 3 (three) times daily. 100 each 11  . atorvastatin (LIPITOR) 80 MG tablet Take 1 tablet (80 mg total) by mouth daily. (Patient taking differently: Take 80 mg by mouth every evening. ) 30 tablet 3  . dapagliflozin propanediol (FARXIGA) 10 MG TABS tablet Take 10 mg by mouth daily before breakfast. 30 tablet 6  . glipiZIDE (GLUCOTROL) 10 MG tablet  TAKE 1 TABLET BY MOUTH TWICE DAILY BEFORE MEAL(S). (Patient taking differently: Take 10 mg by mouth 2 (two) times daily before a meal. ) 60 tablet 6  . Insulin Glargine (LANTUS SOLOSTAR) 100 UNIT/ML Solostar Pen Inject 20 Units into the skin daily. (Patient taking differently: Inject 20 Units into the skin daily at 6 PM. ) 5 pen 6  . ciprofloxacin (CIPRO) 500 MG tablet Take 1 tablet (500 mg total) by mouth 2 (two) times daily. (Patient not taking: Reported on 08/30/2020) 20 tablet 0  . clindamycin (CLEOCIN) 300 MG capsule Take 1 capsule (300 mg total) by mouth 3 (three) times daily. (Patient not taking: Reported on 08/30/2020) 21 capsule 0  . HYDROcodone-acetaminophen (NORCO/VICODIN) 5-325 MG tablet Take 1 tablet by mouth every 4 (four) hours as needed for moderate pain. (Patient not taking: Reported on 08/30/2020) 12 tablet 0  . polyethylene glycol (MIRALAX /  GLYCOLAX) 17 g packet Take 17 g by mouth daily as needed for mild constipation. (Patient not taking: Reported on 08/30/2020) 14 each 0  . silver sulfADIAZINE (SILVADENE) 1 % cream Apply 1 application topically daily. (Patient not taking: Reported on 08/30/2020) 50 g 0  . amoxicillin-clavulanate (AUGMENTIN) 875-125 MG tablet Take 1 tablet by mouth 2 (two) times daily. (Patient not taking: Reported on 08/30/2020) 20 tablet 0   No facility-administered medications prior to visit.     ROS Review of Systems  Constitutional: Negative for activity change and appetite change.  HENT: Negative for sinus pressure and sore throat.   Eyes: Negative for visual disturbance.  Respiratory: Negative for cough, chest tightness and shortness of breath.   Cardiovascular: Negative for chest pain and leg swelling.  Gastrointestinal: Negative for abdominal distention, abdominal pain, constipation and diarrhea.  Endocrine: Negative.   Genitourinary: Negative for dysuria.  Musculoskeletal: Negative for joint swelling and myalgias.  Skin: Positive for wound. Negative for  rash.  Allergic/Immunologic: Negative.   Neurological: Negative for weakness, light-headedness and numbness.  Psychiatric/Behavioral: Negative for dysphoric mood and suicidal ideas.    Objective:  BP 123/81   Pulse 72   Ht  (1.981 m)   Wt 269 lb (122 kg)   SpO2 100%   BMI 31.09 kg/m   BP/Weight 08/30/2020 07/14/2020 07/07/2020  Systolic BP 123 135 160  Diastolic BP 81 102 89  Wt. (Lbs) 269 275 -  BMI 31.09 31.78 -      Physical Exam Constitutional:      Appearance: He is well-developed.  Neck:     Vascular: No JVD.  Cardiovascular:     Rate and Rhythm: Normal rate.     Heart sounds: Normal heart sounds. No murmur heard.   Pulmonary:     Effort: Pulmonary effort is normal.     Breath sounds: Normal breath sounds. No wheezing or rales.  Chest:     Chest wall: No tenderness.  Abdominal:     General: Bowel sounds are normal. There is no distension.     Palpations: Abdomen is soft. There is no mass.     Tenderness: There is no abdominal tenderness.  Musculoskeletal:        General: Normal range of motion.     Right lower leg: No edema.     Left lower leg: No edema.  Neurological:     Mental Status: He is alert and oriented to person, place, and time.  Psychiatric:        Mood and Affect: Mood normal.     CMP Latest Ref Rng & Units 07/14/2020 06/27/2020 06/26/2020  Glucose 70 - 99 mg/dL 161(W) 960(A) 540(J)  BUN 6 - 20 mg/dL 81(X) 91(Y) 20  Creatinine 0.61 - 1.24 mg/dL 7.82 9.56(O) 1.30  Sodium 135 - 145 mmol/L 139 139 137  Potassium 3.5 - 5.1 mmol/L 4.2 4.3 4.0  Chloride 98 - 111 mmol/L 107 103 101  CO2 22 - 32 mmol/L Calcium 8.9 - 10.3 mg/dL 9.2 9.1 9.2  Total Protein 6.5 - 8.1 g/dL - - -  Total Bilirubin 0.3 - 1.2 mg/dL - - -  Alkaline Phos 38 - 126 U/L - - -  AST 15 - 41 U/L - - -  ALT 0 - 44 U/L - - -    Lipid Panel     Component Value Date/Time   CHOL 189 05/26/2020 1011   CHOL 160 03/20/2018 0829   TRIG 108  05/26/2020 1011   HDL 40  (L) 05/26/2020 1011   HDL 34 (L) 03/20/2018 0829   CHOLHDL 4.7 05/26/2020 1011   VLDL 22 05/26/2020 1011   LDLCALC 127 (H) 05/26/2020 1011   LDLCALC 94 03/20/2018 0829   LDLDIRECT 117.0 06/14/2013 0826    CBC    Component Value Date/Time   WBC 8.8 06/27/2020 0354   RBC 4.73 06/27/2020 0354   HGB 12.7 (L) 06/27/2020 0354   HGB 14.9 03/20/2018 0829   HCT 39.2 06/27/2020 0354   HCT 45.0 03/20/2018 0829   PLT 290 06/27/2020 0354   PLT 203 03/20/2018 0829   MCV 82.9 06/27/2020 0354   MCV 83 03/20/2018 0829   MCH 26.8 06/27/2020 0354   MCHC 32.4 06/27/2020 0354   RDW 12.8 06/27/2020 0354   RDW 13.6 03/20/2018 0829   LYMPHSABS 2.7 06/27/2020 0354   LYMPHSABS 2.0 03/20/2018 0829   MONOABS 0.8 06/27/2020 0354   EOSABS 0.3 06/27/2020 0354   EOSABS 0.2 03/20/2018 0829   BASOSABS 0.1 06/27/2020 0354   BASOSABS 0.0 03/20/2018 0829    Lab Results  Component Value Date   HGBA1C 6.9 08/30/2020    Assessment & Plan:  1. Diabetes mellitus type 2 in obese (HCC) Controlled with A1c of 6.9 down from 9.3 previously; goal is less than 7.0 He has been commended on this improvement Continue current regimen Counseled on Diabetic diet, my plate method, 268 minutes of moderate intensity exercise/week Blood sugar logs with fasting goals of 80-120 mg/dl, random of less than 341 and in the event of sugars less than 60 mg/dl or greater than 962 mg/dl encouraged to notify the clinic. Advised on the need for annual eye exams, annual foot exams, Pneumonia vaccine. - POCT glucose (manual entry) - POCT glycosylated hemoglobin (Hb A1C) - Microalbumin / creatinine urine ratio - atorvastatin (LIPITOR) 80 MG tablet; Take 1 tablet (80 mg total) by mouth every evening.  Dispense: 30 tablet; Refill: 6 - dapagliflozin propanediol (FARXIGA) 10 MG TABS tablet; Take 1 tablet (10 mg total) by mouth daily before breakfast.  Dispense: 30 tablet; Refill: 6 - insulin glargine (LANTUS SOLOSTAR) 100 UNIT/ML Solostar  Pen; Inject 20 Units into the skin daily.  Dispense: 3 mL; Refill: 6  2. Coronary artery disease involving native coronary artery of native heart without angina pectoris Stable Risk factor modification - glipiZIDE (GLUCOTROL) 10 MG tablet; Take 1 tablet (10 mg total) by mouth 2 (two) times daily before a meal.  Dispense: 60 tablet; Refill: 6  3. Essential hypertension, benign Controlled Counseled on blood pressure goal of less than 130/80, low-sodium, DASH diet, medication compliance, 150 minutes of moderate intensity exercise per week. Discussed medication compliance, adverse effects.   4. Diabetic ulcer of left midfoot associated with type 2 diabetes mellitus, with muscle involvement without evidence of necrosis [facility]) Improving Management per podiatry  Meds ordered this encounter  Medications  . atorvastatin (LIPITOR) 80 MG tablet    Sig: Take 1 tablet (80 mg total) by mouth every evening.    Dispense:  30 tablet    Refill:  6    HF FUND  . dapagliflozin propanediol (FARXIGA) 10 MG TABS tablet    Sig: Take 1 tablet (10 mg total) by mouth daily before breakfast.    Dispense:  30 tablet    Refill:  6  . glipiZIDE (GLUCOTROL) 10 MG tablet    Sig: Take 1 tablet (10 mg total) by mouth 2 (two) times daily before a meal.  Dispense:  60 tablet    Refill:  6  . insulin glargine (LANTUS SOLOSTAR) 100 UNIT/ML Solostar Pen    Sig: Inject 20 Units into the skin daily.    Dispense:  3 mL    Refill:  6    Dose increase    Follow-up: Return in about 3 months (around 11/29/2020) for chronic disease management.       Hoy RegisterEnobong Orlanda Frankum, MD, FAAFP. [facility] [place], [place] [phone]   08/30/2020, 12:40 PM

## 2020-08-31 LAB — MICROALBUMIN / CREATININE URINE RATIO
Creatinine, Urine: 64.1 mg/dL
Microalb/Creat Ratio: <5 mg/g creat (ref 0–29)
Microalbumin, Urine: <3 ug/mL

## 2020-09-01 NOTE — Telephone Encounter (Signed)
Patients PA for [name] was denied.   Sent in application to [employer].  Will follow up.

## 2020-09-06 NOTE — Progress Notes (Deleted)
Cardiology Office Note:   Date:  09/06/2020  NAME:  [name]    MRN: [id number] DOB:  Feb 13, 1971   PCP:  Hoy Register, MD  Cardiologist:  No primary care provider on file.  Electrophysiologist:  None   Referring MD: Laurey Morale, MD   No chief complaint on file. ***  History of Present Illness:   [name] is a 49 y.o. male with a hx of *** who is being seen today for the evaluation of *** at the request of ***.  Past Medical History: Past Medical History:  Diagnosis Date  . CAD (coronary artery disease)    NSTEMI 10/2011: LHC 11/04/11: pLAD 90%, mLAD 60-70%, small D2 sub totally occluded at ostium, small OM1 90% ostial, 90% mid, mOM2 30%, oPL 80%, RCA 30%, dPDA 80%, EF 20% with ant AK.  PCI:  Promus DES to pLAD.  [name] CHF (congestive heart failure) (HCC)   . Chronic systolic heart failure (HCC)   . DM2 (diabetes mellitus, type 2) (HCC)   . GERD (gastroesophageal reflux disease)   . HTN (hypertension)   . Hyperlipidemia   . Ischemic cardiomyopathy    Echo 11/03/11: mod LVH, mild focal basal septal hypertrophy, EF 15%, grade 2 diast dysfxn, mild MR, mild to mod LAE, mild RVE, mild to mod reduced RVSF.  EF 3/5 50% by echo  . Obesity     Past Surgical History: Past Surgical History:  Procedure Laterality Date  . AMPUTATION Left 06/23/2020   Procedure: AMPUTATION RAY 5th;  Surgeon: Vivi Barrack, DPM;  Location: [facility] OR;  Service: Podiatry;  Laterality: Left;  . CARDIAC CATHETERIZATION N/A 10/25/2016   Procedure: Right/Left Heart Cath and Coronary Angiography;  Surgeon: Marykay Lex, MD;  Location: [facility] INVASIVE CV LAB;  Service: Cardiovascular;  Laterality: N/A;  . CARDIAC CATHETERIZATION N/A 10/25/2016   Procedure: Coronary Stent Intervention;  Surgeon: Marykay Lex, MD;  Location: [facility] INVASIVE CV LAB;  Service: Cardiovascular;  Laterality: N/A;  . CARDIAC CATHETERIZATION N/A 10/28/2016   Procedure: Coronary Stent Intervention;  Surgeon: Kathleene Hazel, MD;  Location: [facility] INVASIVE CV LAB;  Service: Cardiovascular;  Laterality: N/A;  . GRAFT APPLICATION Left 07/14/2020   Procedure: APPLICATION OF SKIN GRAFT USING [employer] BILAYER MATRIX WOUND DRESSING;  Surgeon: Park Liter, DPM;  Location: [facility] OR;  Service: Podiatry;  Laterality: Left;  . I & D EXTREMITY Left 07/14/2020   Procedure: IRRIGATION AND DEBRIDEMENT LEFT FOOT;  Surgeon: Park Liter, DPM;  Location: [facility] OR;  Service: Podiatry;  Laterality: Left;  . LEFT AND RIGHT HEART CATHETERIZATION WITH CORONARY ANGIOGRAM N/A 11/04/2011   Procedure: LEFT AND RIGHT HEART CATHETERIZATION WITH CORONARY ANGIOGRAM;  Surgeon: Rollene Rotunda, MD;  Location: [facility] CATH LAB;  Service: Cardiovascular;  Laterality: N/A;  . NONE    . PERCUTANEOUS CORONARY STENT INTERVENTION (PCI-S)  11/04/2011   Procedure: PERCUTANEOUS CORONARY STENT INTERVENTION (PCI-S);  Surgeon: Tonny Bollman, MD;  Location: [facility] CATH LAB;  Service: Cardiovascular;;  . WOUND DEBRIDEMENT Left 06/26/2020   Procedure: LEFT FOOT WOUND DEBRIDEMENT AND GRAFT APPLICATION;  Surgeon: Vivi Barrack, DPM;  Location: [facility] OR;  Service: Podiatry;  Laterality: Left;    Current Medications: No outpatient medications have been marked as taking for the 09/07/20 encounter (Appointment) with O'Neal, Ronnald Ramp, MD.     Allergies:    Patient has no known allergies.   Social History: Social History   Socioeconomic History  . Marital status: Single    Spouse name:  Not on file  . Number of children: Not on file  . Years of education: Not on file  . Highest education level: Not on file  Occupational History  . Not on file  Tobacco Use  . Smoking status: Never Smoker  . Smokeless tobacco: Never Used  Vaping Use  . Vaping Use: Never used  Substance and Sexual Activity  . Alcohol use: Yes    Alcohol/week: 1.0 standard drink    Types: 1 Shots of liquor per week    Comment: OCCASIONAL  . Drug use: No  . Sexual activity: Yes  Other  Topics Concern  . Not on file  Social History Narrative  . Not on file   Social Determinants of Health   Financial Resource Strain: Low Risk   . Difficulty of Paying Living Expenses: Not very hard  Food Insecurity: No Food Insecurity  . Worried About Programme researcher, broadcasting/film/video in the Last Year: Never true  . Ran Out of Food in the Last Year: Never true  Transportation Needs: No Transportation Needs  . Lack of Transportation (Medical): No  . Lack of Transportation (Non-Medical): No  Physical Activity:   . Days of Exercise per Week: Not on file  . Minutes of Exercise per Session: Not on file  Stress:   . Feeling of Stress : Not on file  Social Connections:   . Frequency of Communication with Friends and Family: Not on file  . Frequency of Social Gatherings with Friends and Family: Not on file  . Attends Religious Services: Not on file  . Active Member of Clubs or Organizations: Not on file  . Attends Banker Meetings: Not on file  . Marital Status: Not on file     Family History: The patient's ***family history includes Heart disease in his father and mother.  ROS:   All other ROS reviewed and negative. Pertinent positives noted in the HPI.     EKGs/Labs/Other Studies Reviewed:   The following studies were personally reviewed by me today:  EKG:  EKG is *** ordered today.  The ekg ordered today demonstrates ***, and was personally reviewed by me.   Recent Labs: 06/23/2020: ALT 14; Magnesium 2.1 06/27/2020: Hemoglobin 12.7; Platelets 290 07/14/2020: BUN 23; Creatinine, Ser 1.06; Potassium 4.2; Sodium 139   Recent Lipid Panel    Component Value Date/Time   CHOL 189 05/26/2020 1011   CHOL 160 03/20/2018 0829   TRIG 108 05/26/2020 1011   HDL 40 (L) 05/26/2020 1011   HDL 34 (L) 03/20/2018 0829   CHOLHDL 4.7 05/26/2020 1011   VLDL 22 05/26/2020 1011   LDLCALC 127 (H) 05/26/2020 1011   LDLCALC 94 03/20/2018 0829   LDLDIRECT 117.0 06/14/2013 0826    Physical Exam:    VS:  There were no vitals taken for this visit.   Wt Readings from Last 3 Encounters:  08/30/20 269 lb (122 kg)  07/14/20 275 lb (124.7 kg)  06/26/20 275 lb (124.7 kg)    General: Well nourished, well developed, in no acute distress Heart: Atraumatic, normal size  Eyes: PEERLA, EOMI  Neck: Supple, no JVD Endocrine: No thryomegaly Cardiac: Normal S1, S2; RRR; no murmurs, rubs, or gallops Lungs: Clear to auscultation bilaterally, no wheezing, rhonchi or rales  Abd: Soft, nontender, no hepatomegaly  Ext: No edema, pulses 2+ Musculoskeletal: No deformities, BUE and BLE strength normal and equal Skin: Warm and dry, no rashes   Neuro: Alert and oriented to person, place, time, and situation,  CNII-XII grossly intact, no focal deficits  Psych: Normal mood and affect   ASSESSMENT:   [name] is a 49 y.o. male who presents for the following: No diagnosis found.  PLAN:   There are no diagnoses linked to this encounter.  Disposition: No follow-ups on file.  Medication Adjustments/Labs and Tests Ordered: Current medicines are reviewed at length with the patient today.  Concerns regarding medicines are outlined above.  No orders of the defined types were placed in this encounter.  No orders of the defined types were placed in this encounter.   There are no Patient Instructions on file for this visit.   Time Spent with Patient: I have spent a total of *** minutes with patient reviewing hospital notes, telemetry, EKGs, labs and examining the patient as well as establishing an assessment and plan that was discussed with the patient.  > 50% of time was spent in direct patient care.  Signed, Lenna Gilford. Flora Lipps, MD [facility]  [address], [address] [place], [place] [address] [phone]  09/06/2020 5:02 PM

## 2020-09-07 ENCOUNTER — Ambulatory Visit: Payer: Self-pay | Admitting: Cardiovascular Disease

## 2020-09-08 ENCOUNTER — Ambulatory Visit (INDEPENDENT_AMBULATORY_CARE_PROVIDER_SITE_OTHER): Payer: No Typology Code available for payment source | Admitting: Podiatry

## 2020-09-08 ENCOUNTER — Other Ambulatory Visit: Payer: Self-pay

## 2020-09-08 DIAGNOSIS — L97421 Non-pressure chronic ulcer of left heel and midfoot limited to breakdown of skin: Secondary | ICD-10-CM

## 2020-09-08 DIAGNOSIS — E08621 Diabetes mellitus due to underlying condition with foot ulcer: Secondary | ICD-10-CM

## 2020-09-08 DIAGNOSIS — L97511 Non-pressure chronic ulcer of other part of right foot limited to breakdown of skin: Secondary | ICD-10-CM

## 2020-09-08 MED FILL — OMEPRAZOLE DR 20 MG CAPSULE: 20 | 30 days supply | Qty: 30 | Fill #1

## 2020-09-13 NOTE — Telephone Encounter (Signed)
Advanced Heart Failure Patient Advocate Encounter   Patient was approved to receive Farxiga from [employer]  Patient ID: [id number] Effective dates: 09/07/20 through 09/06/21  Spoke with patient and he has received his first 90 day supply from [employer].  Archer Asa, CPhT

## 2020-09-14 ENCOUNTER — Telehealth: Payer: Self-pay | Admitting: Pharmacist

## 2020-09-14 ENCOUNTER — Ambulatory Visit: Payer: Self-pay | Admitting: Pharmacist

## 2020-09-14 NOTE — Telephone Encounter (Signed)
Left message for pt - he missed appt today in lipid clinic to discuss PCSK9i therapy. Appt will need to be rescheduled when pt returns call.   Of note, Repatha prior authorization has already been submitted and approved through 09/14/21 and $5 copay card has been activated.

## 2020-09-14 NOTE — Progress Notes (Deleted)
Patient ID: [name]                 DOB: 05/05/1971                    MRN: [id number]     HPI: [name] is a 49 y.o. male patient referred to lipid clinic by Dr Shirlee Latch. PMH is significant for CAD s/p NSTEMI in 09/2011 at age 45 with DES to prox LAD, LVEF down to 15% at that time, DES x2 to LCx/OM2 in 09/2016 and staged procedure with DES to distal RCA/ostial PDA, LVEF improved to 35% 07/2019, DM, HTN, HLD, obesity, and suspected OSA.  Taking atorva when labs drawn in may? Took prava 40-80mg  and simva 20in past - any issues or just changed med? Start pcsk9i - has Nurse, learning disability Repatha prior auth sent and approved through 09/14/21  Current Medications: atorvastatin 80mg  daily Risk Factors: NSTEMI at age 70, further stents placed in 2017, DM LDL goal: 55mg /dL  Diet:   Exercise:   Family History:   Social History: Denies tobacco and illicit drug use. Occasional alcohol use.  Labs: 05/26/20: TC 189, TG 108, HDL 40, LDL 127  Past Medical History:  Diagnosis Date   CAD (coronary artery disease)    NSTEMI 10/2011: LHC 11/04/11: pLAD 90%, mLAD 60-70%, small D2 sub totally occluded at ostium, small OM1 90% ostial, 90% mid, mOM2 30%, oPL 80%, RCA 30%, dPDA 80%, EF 20% with ant AK.  PCI:  Promus DES to pLAD.   CHF (congestive heart failure) (HCC)    Chronic systolic heart failure (HCC)    DM2 (diabetes mellitus, type 2) (HCC)    GERD (gastroesophageal reflux disease)    HTN (hypertension)    Hyperlipidemia    Ischemic cardiomyopathy    Echo 11/03/11: mod LVH, mild focal basal septal hypertrophy, EF 15%, grade 2 diast dysfxn, mild MR, mild to mod LAE, mild RVE, mild to mod reduced RVSF.  EF 3/5 50% by echo   Obesity     Current Outpatient Medications on File Prior to Visit  Medication Sig Dispense Refill   aspirin 81 MG chewable tablet Chew 1 tablet (81 mg total) by mouth daily. 30 tablet 6   atorvastatin (LIPITOR) 80 MG tablet Take 1 tablet (80 mg total)  by mouth every evening. 30 tablet 6   Blood Glucose Monitoring Suppl (TRUE METRIX METER) DEVI 1 each by Does not apply route 3 (three) times daily. 1 Device 0   carvedilol (COREG) 6.25 MG tablet Take 1 tablet (6.25 mg total) by mouth 2 (two) times daily with a meal. 60 tablet 3   cetirizine (ZYRTEC) 10 MG tablet Take 1 tablet (10 mg total) by mouth daily. (Patient taking differently: Take 10 mg by mouth daily as needed for allergies. ) 30 tablet 1   ciprofloxacin (CIPRO) 500 MG tablet Take 1 tablet (500 mg total) by mouth 2 (two) times daily. 20 tablet 0   clindamycin (CLEOCIN) 300 MG capsule Take 1 capsule (300 mg total) by mouth 3 (three) times daily. 21 capsule 0   collagenase (SANTYL) ointment Apply 1 application topically daily. 15 g 0   dapagliflozin propanediol (FARXIGA) 10 MG TABS tablet Take 1 tablet (10 mg total) by mouth daily before breakfast. 30 tablet 6   digoxin (LANOXIN) 0.125 MG tablet TAKE 1 TABLET (125 MCG TOTAL) BY MOUTH DAILY. (Patient taking differently: Take 0.125 mg by mouth daily. ) 30 tablet 2   furosemide (LASIX) 40 MG  tablet TAKE 1 TABLET (40 MG TOTAL) BY MOUTH DAILY. 30 tablet 3   glipiZIDE (GLUCOTROL) 10 MG tablet Take 1 tablet (10 mg total) by mouth 2 (two) times daily before a meal. 60 tablet 6   glucose blood (TRUE METRIX BLOOD GLUCOSE TEST) test strip 1 each by Other route 3 (three) times daily. 100 each 12   HYDROcodone-acetaminophen (NORCO/VICODIN) 5-325 MG tablet Take 1 tablet by mouth every 4 (four) hours as needed for moderate pain. 12 tablet 0   insulin glargine (LANTUS SOLOSTAR) 100 UNIT/ML Solostar Pen Inject 20 Units into the skin daily. 3 mL 6   Insulin Pen Needle (TRUEPLUS PEN NEEDLES) 31G X 5 MM MISC USE AS DIRECTED AT BEDTIME. 100 each 6   Lactobacillus (ACIDOPHILUS PO) Take 2 capsules by mouth daily at 12 noon.     omeprazole (PRILOSEC) 20 MG capsule Take 1 capsule (20 mg total) by mouth daily. 30 capsule 3   polyethylene glycol  (MIRALAX / GLYCOLAX) 17 g packet Take 17 g by mouth daily as needed for mild constipation. 14 each 0   sacubitril-valsartan (ENTRESTO) 97-103 MG Take 1 tablet by mouth 2 (two) times daily. 180 tablet 3   sildenafil (REVATIO) 20 MG tablet Take 1 tablet (20 mg total) by mouth as needed (for erectile dysfunction). 10 tablet 3   silver sulfADIAZINE (SILVADENE) 1 % cream Apply 1 application topically daily. 50 g 0   spironolactone (ALDACTONE) 50 MG tablet Take 1 tablet (50 mg total) by mouth daily. 30 tablet 2   TRUEplus Lancets 28G MISC 1 each by Does not apply route 3 (three) times daily. 100 each 11   No current facility-administered medications on file prior to visit.    No Known Allergies  Assessment/Plan:  1. Hyperlipidemia -

## 2020-09-15 ENCOUNTER — Other Ambulatory Visit (HOSPITAL_COMMUNITY): Payer: Self-pay

## 2020-09-20 ENCOUNTER — Other Ambulatory Visit (HOSPITAL_COMMUNITY): Payer: Self-pay

## 2020-09-21 ENCOUNTER — Ambulatory Visit: Payer: Self-pay | Admitting: Cardiovascular Disease

## 2020-09-21 NOTE — Progress Notes (Signed)
Patient ID: [name]                 DOB: 10/31/1971                    MRN: [id number]     HPI: [name] is a 49 y.o. male patient referred to lipid clinic by Dr Shirlee Latch. PMH is significant for CAD s/p NSTEMI in 09/2011 at age 36 with DES to prox LAD, LVEF down to 15% at that time, DES x2 to LCx/OM2 in 09/2016 and staged procedure with DES to distal RCA/ostial PDA, LVEF improved to 35% 07/2019, DM, HTN, HLD, obesity, and suspected OSA.  Patient presents to lipid clinic today for initiation of PCSK9 inhibitor. Patient has been on atorvastatin for few years now. He reports he was adherent to atorvastatin 80 mg when his lipid panel was drawn in May (LDL 127). Patient does not remember any of his past statin medications that he has tried before. Patient reports he is doing well with lifestyle habits, he has lost 30 lbs by drinking more water and eating salads.   Current Medications: atorvastatin 80mg  daily Risk Factors: NSTEMI at age 73, further stents placed in 2017, DM LDL goal: 55mg /dL  Diet: Water and salad  Family History: Father and mother with history of heart disease. Mother was diagnosed in her 11s-60s.  Social History: Denies tobacco and illicit drug use. Occasional alcohol use.  Labs: 05/26/20: TC 189, TG 108, HDL 40, LDL 127 (on atorvastatin 80mg  daily)  Past Medical History:  Diagnosis Date   CAD (coronary artery disease)    NSTEMI 10/2011: LHC 11/04/11: pLAD 90%, mLAD 60-70%, small D2 sub totally occluded at ostium, small OM1 90% ostial, 90% mid, mOM2 30%, oPL 80%, RCA 30%, dPDA 80%, EF 20% with ant AK.  PCI:  Promus DES to pLAD.   CHF (congestive heart failure) (HCC)    Chronic systolic heart failure (HCC)    DM2 (diabetes mellitus, type 2) (HCC)    GERD (gastroesophageal reflux disease)    HTN (hypertension)    Hyperlipidemia    Ischemic cardiomyopathy    Echo 11/03/11: mod LVH, mild focal basal septal hypertrophy, EF 15%, grade 2 diast dysfxn, mild MR,  mild to mod LAE, mild RVE, mild to mod reduced RVSF.  EF 3/5 50% by echo   Obesity     Current Outpatient Medications on File Prior to Visit  Medication Sig Dispense Refill   aspirin 81 MG chewable tablet Chew 1 tablet (81 mg total) by mouth daily. 30 tablet 6   atorvastatin (LIPITOR) 80 MG tablet Take 1 tablet (80 mg total) by mouth every evening. 30 tablet 6   Blood Glucose Monitoring Suppl (TRUE METRIX METER) DEVI 1 each by Does not apply route 3 (three) times daily. 1 Device 0   carvedilol (COREG) 6.25 MG tablet Take 1 tablet (6.25 mg total) by mouth 2 (two) times daily with a meal. 60 tablet 3   cetirizine (ZYRTEC) 10 MG tablet Take 1 tablet (10 mg total) by mouth daily. (Patient taking differently: Take 10 mg by mouth daily as needed for allergies. ) 30 tablet 1   ciprofloxacin (CIPRO) 500 MG tablet Take 1 tablet (500 mg total) by mouth 2 (two) times daily. 20 tablet 0   clindamycin (CLEOCIN) 300 MG capsule Take 1 capsule (300 mg total) by mouth 3 (three) times daily. 21 capsule 0   collagenase (SANTYL) ointment Apply 1 application topically daily. 15 g 0  dapagliflozin propanediol (FARXIGA) 10 MG TABS tablet Take 1 tablet (10 mg total) by mouth daily before breakfast. 30 tablet 6   digoxin (LANOXIN) 0.125 MG tablet TAKE 1 TABLET (125 MCG TOTAL) BY MOUTH DAILY. (Patient taking differently: Take 0.125 mg by mouth daily. ) 30 tablet 2   furosemide (LASIX) 40 MG tablet TAKE 1 TABLET (40 MG TOTAL) BY MOUTH DAILY. 30 tablet 3   glipiZIDE (GLUCOTROL) 10 MG tablet Take 1 tablet (10 mg total) by mouth 2 (two) times daily before a meal. 60 tablet 6   glucose blood (TRUE METRIX BLOOD GLUCOSE TEST) test strip 1 each by Other route 3 (three) times daily. 100 each 12   HYDROcodone-acetaminophen (NORCO/VICODIN) 5-325 MG tablet Take 1 tablet by mouth every 4 (four) hours as needed for moderate pain. 12 tablet 0   insulin glargine (LANTUS SOLOSTAR) 100 UNIT/ML Solostar Pen Inject 20 Units  into the skin daily. 3 mL 6   Insulin Pen Needle (TRUEPLUS PEN NEEDLES) 31G X 5 MM MISC USE AS DIRECTED AT BEDTIME. 100 each 6   Lactobacillus (ACIDOPHILUS PO) Take 2 capsules by mouth daily at 12 noon.     omeprazole (PRILOSEC) 20 MG capsule Take 1 capsule (20 mg total) by mouth daily. 30 capsule 3   polyethylene glycol (MIRALAX / GLYCOLAX) 17 g packet Take 17 g by mouth daily as needed for mild constipation. 14 each 0   sacubitril-valsartan (ENTRESTO) 97-103 MG Take 1 tablet by mouth 2 (two) times daily. 180 tablet 3   sildenafil (REVATIO) 20 MG tablet Take 1 tablet (20 mg total) by mouth as needed (for erectile dysfunction). 10 tablet 3   silver sulfADIAZINE (SILVADENE) 1 % cream Apply 1 application topically daily. 50 g 0   spironolactone (ALDACTONE) 50 MG tablet Take 1 tablet (50 mg total) by mouth daily. 30 tablet 2   TRUEplus Lancets 28G MISC 1 each by Does not apply route 3 (three) times daily. 100 each 11   No current facility-administered medications on file prior to visit.    No Known Allergies  Assessment/Plan:  1. Hyperlipidemia - Patient's LDL is above goal of <55 mg/dL despite adherence to high-intensity statin therapy. Patient has an increased risk of future ASCVD events based on his family history and NSTEMI at early age, warranting further lipid-lowering therapy. We will start Repatha 140 mg SQ injection once every 2 weeks. Prior authorization has been approved and $5 copay card has been activated and given to patient. Counseled patient on clinical benefits, side effects, and proper injection technique of Repatha. Patient has experience with pen injections with his insulin. No changes to any other medications (continue atorvastatin 80 mg daily). Repeat lipid panel scheduled for 3 months.   Pt seen with Lucina Mellow (PharmD Candidate)  Annesha Delgreco E. Berlie Hatchel, PharmD, BCACP, CPP [facility] [address] [place], [place] [address] Phone: [phone]; Fax: [phone] 09/22/2020 11:20 AM

## 2020-09-22 ENCOUNTER — Other Ambulatory Visit: Payer: Self-pay

## 2020-09-22 ENCOUNTER — Ambulatory Visit (INDEPENDENT_AMBULATORY_CARE_PROVIDER_SITE_OTHER): Payer: No Typology Code available for payment source | Admitting: Pharmacist

## 2020-09-22 ENCOUNTER — Ambulatory Visit (INDEPENDENT_AMBULATORY_CARE_PROVIDER_SITE_OTHER): Payer: No Typology Code available for payment source | Admitting: Podiatry

## 2020-09-22 ENCOUNTER — Other Ambulatory Visit: Payer: Self-pay | Admitting: Pharmacist

## 2020-09-22 DIAGNOSIS — E785 Hyperlipidemia, unspecified: Secondary | ICD-10-CM | POA: Insufficient documentation

## 2020-09-22 DIAGNOSIS — L97511 Non-pressure chronic ulcer of other part of right foot limited to breakdown of skin: Secondary | ICD-10-CM | POA: Diagnosis not present

## 2020-09-22 DIAGNOSIS — L97421 Non-pressure chronic ulcer of left heel and midfoot limited to breakdown of skin: Secondary | ICD-10-CM | POA: Diagnosis not present

## 2020-09-22 DIAGNOSIS — E782 Mixed hyperlipidemia: Secondary | ICD-10-CM

## 2020-09-22 DIAGNOSIS — E08621 Diabetes mellitus due to underlying condition with foot ulcer: Secondary | ICD-10-CM

## 2020-09-22 MED ORDER — REPATHA SURECLICK 140 MG/ML ~~LOC~~ SOAJ: 1.0000 "pen " | SUBCUTANEOUS | 11 refills | Status: DC

## 2020-09-22 MED FILL — FUROSEMIDE 40 MG TAB: 40 | 30 days supply | Qty: 30 | Fill #1

## 2020-09-22 MED FILL — DIGOXIN 0.125 MG TABLET: 125 | 30 days supply | Qty: 30 | Fill #1

## 2020-09-22 MED FILL — REPATHA SURECLICK 140 MG/ML: 140 | 28 days supply | Qty: 2 | Fill #0

## 2020-09-22 MED FILL — glipiZIDE 10 MG TABS: 10 | 30 days supply | Qty: 60 | Fill #1

## 2020-09-22 NOTE — Patient Instructions (Addendum)
It was nice to meet you today!  Your LDL is 127 and your goal is < 55  Start taking Repatha injections once every 2 weeks into the fatty tissue of your abdomen  Continue taking atorvastatin 80mg  daily  Recheck fasting lab work on Friday, December 08, 2020 any time after 7:30am

## 2020-09-26 ENCOUNTER — Telehealth: Payer: Self-pay | Admitting: Family Medicine

## 2020-09-26 DIAGNOSIS — E349 Endocrine disorder, unspecified: Secondary | ICD-10-CM

## 2020-09-26 NOTE — Telephone Encounter (Signed)
Will route to PCP for review. 

## 2020-09-26 NOTE — Telephone Encounter (Signed)
Patient was called and informed to come in for lab work.

## 2020-09-26 NOTE — Telephone Encounter (Signed)
I have ordered testosterone level for him which he would need to come into the lab to have drawn and I will be in touch with him once I receive the results.

## 2020-09-26 NOTE — Addendum Note (Signed)
Addended by: Hoy Register on: 09/26/2020 01:38 PM   Modules accepted: Orders

## 2020-09-26 NOTE — Telephone Encounter (Signed)
Copied from CRM [id number]. Topic: General - Other >> Sep 25, 2020  5:13 PM Dalphine Handing A wrote: Patient would like to start back his Testosterone shots again and wants to know what Dr. Alvis Lemmings would advise him to do to get these back on track. Please advise

## 2020-09-27 ENCOUNTER — Telehealth: Payer: Self-pay | Admitting: Podiatry

## 2020-09-27 ENCOUNTER — Telehealth (HOSPITAL_COMMUNITY): Payer: Self-pay | Admitting: *Deleted

## 2020-09-27 DIAGNOSIS — E349 Endocrine disorder, unspecified: Secondary | ICD-10-CM

## 2020-09-27 NOTE — Progress Notes (Signed)
  Subjective:  Patient ID: [name], male    DOB: 11-May-1971,  MRN: [id number]  Chief Complaint  Patient presents with  . Routine Post Op    DOS 7.16.2021. Pt states healing well. There is slight irritation/abbrasion noted on the right foot dorsal midfoot aspect pt believes due to wrapping. Pt denies fever/nausea/vomiting/chills.  . Wound Check    Right 5th interdigital wound is improved.    49 y.o. male presents for wound care. Hx confirmed with patient. Objective:  Physical Exam: Wound Location: left lateral foot Wound Measurement: 1.5x1 Wound Base: Granular/Healthy Peri-wound: Normal Exudate: Scant/small amount Serosanguinous exudate wound without warmth, erythema, signs of acute infection  Right 4th toe wound without capsule involvement. No warmth erythema signs of infection.  Improved since prior. Assessment:   1. 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 Diabetic ulcer of other part of right foot associated with diabetes mellitus due to underlying condition, limited to breakdown of skin [facility])    Plan:  Patient was evaluated and treated and all questions answered.  Ulcer left lateral foot -Wound continues to improve no debridement today dressed with Silvadene and Band-Aid Right foot ulcer -Both interdigital lesion and right foot ulcer healing well.  Neither debrided today.  Dressed with Silvadene and Band-Aid.  No follow-ups on file.

## 2020-09-27 NOTE — Telephone Encounter (Signed)
Patient called and wanted to know if he was suppose to have another prescription of the silver sulfADIAZINE sent to his pharmacy.

## 2020-09-27 NOTE — Telephone Encounter (Signed)
Pt sch for bmet here on Fri 10/1, he states PCP wants a testosterone level and he prefers to have it done here at the same time. Do see note and order from PCP advised we can draw and have the results sent to pcp

## 2020-09-28 ENCOUNTER — Other Ambulatory Visit: Payer: Self-pay | Admitting: Podiatry

## 2020-09-28 MED ORDER — SILVER SULFADIAZINE 1 % EX CREA: 1.0000 "application " | TOPICAL_CREAM | Freq: Every day | CUTANEOUS | 0 refills | Status: DC

## 2020-09-28 NOTE — Addendum Note (Signed)
Addended by: [name] on: 09/28/2020 05:53 PM   Modules accepted: Orders

## 2020-09-28 NOTE — Progress Notes (Signed)
  Subjective:  Patient ID: [name], male    DOB: [birth year],  MRN: [id number]  Chief Complaint  Patient presents with  . Wound Check    Pt denies fever/nausea/vomiting/chills. Concern of "skin falling off" with application of Santyl.    49 y.o. male presents for wound care. Hx confirmed with patient.  States that the wound is doing a lot better with the Santyl and some of the skin is starting to peel off Objective:  Physical Exam: Wound Location: left lateral foot Wound Measurement: 2x1 Wound Base: Granular/Healthy Peri-wound: Normal Exudate: Scant/small amount Serosanguinous exudate wound without warmth, erythema, signs of acute infection  Right 4th toe wound without capsule involvement. No warmth erythema signs of infection Assessment:   1. 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 Diabetic ulcer of other part of right foot associated with diabetes mellitus due to underlying condition, limited to breakdown of skin [facility])    Plan:  Patient was evaluated and treated and all questions answered.  Ulcer left lateral foot -Wound improving. -No debridement today -Dressed with Silvadene and dry sterile dressing  Right foot wounds -Improving dressed with Silvadene and Band-Aid  No follow-ups on file.

## 2020-09-29 ENCOUNTER — Other Ambulatory Visit: Payer: Self-pay

## 2020-09-29 ENCOUNTER — Ambulatory Visit (HOSPITAL_COMMUNITY)
Admission: RE | Admit: 2020-09-29 | Discharge: 2020-09-29 | Disposition: A | Discharge status: Home / Self Care | Payer: No Typology Code available for payment source | Source: Ambulatory Visit | Attending: Internal Medicine | Admitting: Internal Medicine

## 2020-09-29 DIAGNOSIS — E782 Mixed hyperlipidemia: Secondary | ICD-10-CM | POA: Diagnosis not present

## 2020-09-29 DIAGNOSIS — E349 Endocrine disorder, unspecified: Secondary | ICD-10-CM | POA: Insufficient documentation

## 2020-09-29 LAB — HEPATIC FUNCTION PANEL
ALT: 22 U/L (ref 0–44)
AST: 19 U/L (ref 15–41)
Albumin: 3.9 g/dL (ref 3.5–5.0)
Alkaline Phosphatase: 45 U/L (ref 38–126)
Bilirubin, Direct: 0.1 mg/dL (ref 0.0–0.2)
Indirect Bilirubin: 0.9 mg/dL (ref 0.3–0.9)
Total Bilirubin: 1 mg/dL (ref 0.3–1.2)
Total Protein: 7.4 g/dL (ref 6.5–8.1)

## 2020-09-29 LAB — LIPID PANEL
Cholesterol: 145 mg/dL (ref 0–200)
HDL: 31 mg/dL — ABNORMAL LOW (ref >40)
LDL Cholesterol: 93 mg/dL (ref 0–99)
Total CHOL/HDL Ratio: 4.7 RATIO
Triglycerides: 106 mg/dL (ref <150)
VLDL: 21 mg/dL (ref 0–40)

## 2020-09-29 MED FILL — SSD 1% CREAM: 1 | 7 days supply | Qty: 50 | Fill #0

## 2020-10-02 ENCOUNTER — Telehealth: Payer: Self-pay

## 2020-10-02 ENCOUNTER — Other Ambulatory Visit (HOSPITAL_COMMUNITY): Payer: Self-pay | Admitting: Cardiology

## 2020-10-02 DIAGNOSIS — I509 Heart failure, unspecified: Secondary | ICD-10-CM

## 2020-10-02 MED FILL — OMEPRAZOLE DR 20 MG CAPSULE: 20 | 30 days supply | Qty: 30 | Fill #0

## 2020-10-02 MED FILL — CARVEDILOL 6.25 MG TABLET: 6.25 | 30 days supply | Qty: 60 | Fill #0

## 2020-10-02 MED FILL — ATORVASTATIN 80 MG TABLET: 80 | 30 days supply | Qty: 30 | Fill #0

## 2020-10-02 MED FILL — SPIRONOLACTONE 50 MG TABS: 50 | 30 days supply | Qty: 30 | Fill #2

## 2020-10-02 NOTE — Telephone Encounter (Signed)
Called pt results not red yet by the provider.

## 2020-10-03 ENCOUNTER — Telehealth: Payer: Self-pay | Admitting: Podiatry

## 2020-10-03 NOTE — Telephone Encounter (Signed)
Here are [employer]. Please call him when you can.

## 2020-10-03 NOTE — Telephone Encounter (Signed)
He said there is no heat or smell... just light pink skin color.

## 2020-10-03 NOTE — Telephone Encounter (Signed)
Called and spoke to patient, states the area is not warm, it has not been spreading. Denies pain.  I discussed with patient that the changes appear to be the same chronic redness that he's had, in absence of warmth I do not think he has an infection starting. Discussed if it changes any further I would be happy to send abx to the pharmacy for him. Patient stated understanding.

## 2020-10-04 LAB — TESTOSTERONE,FREE AND TOTAL
Testosterone, Free: 8.5 pg/mL (ref 6.8–21.5)
Testosterone: 362 ng/dL (ref 264–916)

## 2020-10-13 ENCOUNTER — Encounter: Payer: Self-pay | Admitting: Podiatry

## 2020-10-13 ENCOUNTER — Ambulatory Visit (INDEPENDENT_AMBULATORY_CARE_PROVIDER_SITE_OTHER): Payer: No Typology Code available for payment source

## 2020-10-13 ENCOUNTER — Ambulatory Visit (INDEPENDENT_AMBULATORY_CARE_PROVIDER_SITE_OTHER): Payer: No Typology Code available for payment source | Admitting: Podiatry

## 2020-10-13 ENCOUNTER — Other Ambulatory Visit: Payer: Self-pay | Admitting: Podiatry

## 2020-10-13 ENCOUNTER — Other Ambulatory Visit: Payer: Self-pay

## 2020-10-13 DIAGNOSIS — E08621 Diabetes mellitus due to underlying condition with foot ulcer: Secondary | ICD-10-CM | POA: Diagnosis not present

## 2020-10-13 DIAGNOSIS — L97421 Non-pressure chronic ulcer of left heel and midfoot limited to breakdown of skin: Secondary | ICD-10-CM | POA: Diagnosis not present

## 2020-10-13 MED ORDER — DOXYCYCLINE HYCLATE 100 MG PO TABS: 100.0000 mg | ORAL_TABLET | Freq: Two times a day (BID) | ORAL | 0 refills | Status: DC

## 2020-10-13 MED FILL — DOXYCYCLINE HYCLATE 100 MG: 100 | 7 days supply | Qty: 14 | Fill #0

## 2020-10-13 MED FILL — REPATHA SURECLICK 140 MG/ML: 140 | 28 days supply | Qty: 2 | Fill #1

## 2020-10-13 NOTE — Progress Notes (Signed)
  Subjective:  Patient ID: [name], male    DOB: 04/08/1971,  MRN: [id number]  No chief complaint on file.   49 y.o. male presents for wound care. Hx confirmed with patient.  States that the wounds are overall doing pretty well denies significant drainage.  Denies nausea vomiting fever chills. Objective:  Physical Exam: Wound Location: bilateral 5th MPJ Wound Measurement: 1x0.5 right, 1.7x1 Left Wound Base: Granular/Healthy Peri-wound: Normal Exudate: Scant/small amount Serosanguinous exudate wound without warmth, erythema, signs of acute infection      Assessment:   1. 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  Ulcer bilateral feet -Improving bilaterally -No debridement indicated today -Dressed with silvadene and DSD -Pt to apply santyl daily  Return in about 1 week (around 10/20/2020).

## 2020-10-19 ENCOUNTER — Other Ambulatory Visit: Payer: Self-pay | Admitting: Podiatry

## 2020-10-19 ENCOUNTER — Ambulatory Visit (INDEPENDENT_AMBULATORY_CARE_PROVIDER_SITE_OTHER): Payer: No Typology Code available for payment source | Admitting: Podiatry

## 2020-10-19 ENCOUNTER — Other Ambulatory Visit: Payer: Self-pay

## 2020-10-19 DIAGNOSIS — L03116 Cellulitis of left lower limb: Secondary | ICD-10-CM

## 2020-10-19 DIAGNOSIS — L97511 Non-pressure chronic ulcer of other part of right foot limited to breakdown of skin: Secondary | ICD-10-CM

## 2020-10-19 DIAGNOSIS — L97421 Non-pressure chronic ulcer of left heel and midfoot limited to breakdown of skin: Secondary | ICD-10-CM | POA: Diagnosis not present

## 2020-10-19 DIAGNOSIS — Z899 Acquired absence of limb, unspecified: Secondary | ICD-10-CM

## 2020-10-19 DIAGNOSIS — E08621 Diabetes mellitus due to underlying condition with foot ulcer: Secondary | ICD-10-CM

## 2020-10-19 MED ORDER — DOXYCYCLINE HYCLATE 100 MG PO TABS: 100.0000 mg | ORAL_TABLET | Freq: Two times a day (BID) | ORAL | 0 refills | Status: DC

## 2020-10-19 MED FILL — DOXYCYCLINE HYCLATE 100 MG: 100 | 7 days supply | Qty: 14 | Fill #0

## 2020-10-19 NOTE — Progress Notes (Signed)
  Subjective:  Patient ID: [name], male    DOB: 1971-08-17,  MRN: [id number]  Chief Complaint  Patient presents with  . Diabetic Ulcer    1 wk f/u wound check. Pt overall feels good. No drainage, no smell and N/v. PT does have slight redness around the area. Pt does finish his antibotics today.     49 y.o. male presents with the above complaint. History confirmed with patient.   Objective:  Physical Exam: warm, good capillary refill and normal DP and PT pulses.  On the right foot the segment 5 has nearly fully healed with a small central portion measuring 0.3 cm in diameter.  Small blister on the medial fifth toe.  On the left foot the full-thickness ulcer has improved in character, still with erythema to the dorsal lateral foot.  No pain on palpation.  No purulence or malodor.  Assessment:  No diagnosis found.   Plan:  Patient was evaluated and treated and all questions answered.  No debridement performed today  Continue doxycycline, a refill prescription was sent for this.  The erythema extending to the forefoot does not extend beyond the current border she says that he has been keeping track of this.  Does not appear to be severe cellulitis but could be a mild soft tissue infection will continue antibiotics as  precaution  Continue offloading and dressing daily  Return in 2 weeks to see Dr. Samuella Cota (the patient is out of town next week) No follow-ups on file.

## 2020-10-31 ENCOUNTER — Ambulatory Visit (INDEPENDENT_AMBULATORY_CARE_PROVIDER_SITE_OTHER): Payer: No Typology Code available for payment source | Admitting: Podiatry

## 2020-10-31 ENCOUNTER — Other Ambulatory Visit: Payer: Self-pay

## 2020-10-31 DIAGNOSIS — E08621 Diabetes mellitus due to underlying condition with foot ulcer: Secondary | ICD-10-CM

## 2020-10-31 DIAGNOSIS — L97511 Non-pressure chronic ulcer of other part of right foot limited to breakdown of skin: Secondary | ICD-10-CM

## 2020-10-31 DIAGNOSIS — L97421 Non-pressure chronic ulcer of left heel and midfoot limited to breakdown of skin: Secondary | ICD-10-CM | POA: Diagnosis not present

## 2020-10-31 MED FILL — FUROSEMIDE 40 MG TAB: 40 | 30 days supply | Qty: 30 | Fill #2

## 2020-10-31 MED FILL — glipiZIDE 10 MG TABS: 10 | 90 days supply | Qty: 180 | Fill #0

## 2020-10-31 MED FILL — OMEPRAZOLE 20 MG CAP: 20 | 30 days supply | Qty: 30 | Fill #0

## 2020-10-31 MED FILL — DIGOXIN 0.125 MG TABLET: 125 | 30 days supply | Qty: 30 | Fill #0

## 2020-10-31 MED FILL — SPIRONOLACTONE 50 MG TABS: 50 | 30 days supply | Qty: 30 | Fill #3

## 2020-10-31 NOTE — Progress Notes (Signed)
  Subjective:  Patient ID: [name], male    DOB: 03-09-1971,  MRN: [id number]  Chief Complaint  Patient presents with  . Wound Check    No new concerns, denies fever/nausea/vomiting/chills.  . Toe Pain    Left 1st toe occasional zapping pain, pt states he thinks it may be his neuropathy.    49 y.o. male presents for wound care. Hx confirmed with patient. Thinks worsening neuropathy, thinks the wounds are doing well. Objective:  Physical Exam: Wound Location: bilateral 5th MPJ Wound Measurement: epithelialized right, 2x1 left, more plantar this time than lateral Wound Base: Granular/Healthy Peri-wound: Normal Exudate: Scant/small amount Serosanguinous exudate wound without warmth, erythema, signs of acute infection  Assessment:   1. 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 Diabetic ulcer of other part of right foot associated with diabetes mellitus due to underlying condition, limited to breakdown of skin [facility])    Plan:  Patient was evaluated and treated and all questions answered.  Ulcer bilateral feet -Debrided as below -Continue santyl left, SSD right. Dressed with SSD and meplix left, band-aid right.  Procedure: Selective Debridement of Wound Rationale: Removal of devitalized tissue from the wound to promote healing.  Pre-Debridement Wound Measurements: 2 cm x 1 cm x 0.2 cm  Post-Debridement Wound Measurements: same as pre-debridement. Type of Debridement: sharp selective Tissue Removed: Devitalized soft-tissue Dressing: Dry, sterile, compression dressing. Disposition: Patient tolerated procedure well. Patient to return in 1 week for follow-up.   No follow-ups on file.

## 2020-11-11 ENCOUNTER — Encounter: Payer: Self-pay | Admitting: Podiatry

## 2020-11-11 ENCOUNTER — Ambulatory Visit (INDEPENDENT_AMBULATORY_CARE_PROVIDER_SITE_OTHER): Payer: No Typology Code available for payment source | Admitting: Podiatry

## 2020-11-11 ENCOUNTER — Other Ambulatory Visit: Payer: Self-pay

## 2020-11-11 DIAGNOSIS — E08621 Diabetes mellitus due to underlying condition with foot ulcer: Secondary | ICD-10-CM | POA: Diagnosis not present

## 2020-11-11 DIAGNOSIS — L97421 Non-pressure chronic ulcer of left heel and midfoot limited to breakdown of skin: Secondary | ICD-10-CM | POA: Diagnosis not present

## 2020-11-11 NOTE — Progress Notes (Signed)
  Subjective:  Patient ID: [name], male    DOB: 1971-10-10,  MRN: [id number]  Chief Complaint  Patient presents with  . Foot Ulcer    i am doing ok and i have been watching all three areas    49 y.o. male presents for wound care. Hx confirmed with patient.   Objective:  Physical Exam: Wound Location: bilateral 5th MPJ Wound Measurement: epithelialized right, 2x1 left, more plantar this time than lateral Wound Base: Granular/Healthy Peri-wound: Normal Exudate: Scant/small amount Serosanguinous exudate wound without warmth, erythema, signs of acute infection  Assessment:   1. 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  Ulcer bilateral feet -Debrided as below -Continue santyl bilateral  Procedure: Selective Debridement of Wound Rationale: Removal of devitalized tissue from the wound to promote healing.  Pre-Debridement Wound Measurements: 1.5 cm x 1 cm x 0.2 cm  Post-Debridement Wound Measurements: same as pre-debridement. Type of Debridement: sharp selective Tissue Removed: Devitalized soft-tissue Dressing: Dry, sterile, compression dressing. Disposition: Patient tolerated procedure well. Patient to return in 1 week for follow-up.       Return in about 2 weeks (around 11/25/2020) for Wound Care.

## 2020-11-15 ENCOUNTER — Other Ambulatory Visit: Payer: Self-pay | Admitting: Pharmacist

## 2020-11-15 DIAGNOSIS — E1169 Type 2 diabetes mellitus with other specified complication: Secondary | ICD-10-CM

## 2020-11-15 MED ORDER — TRUEPLUS PEN NEEDLES 31G X 5 MM MISC: 6 refills | Status: DC

## 2020-11-15 MED FILL — TRUEPLUS PEN NDL 31GX3/16: 31G X 5 MM | 25 days supply | Qty: 100 | Fill #0

## 2020-11-15 MED FILL — REPATHA SURECLICK 140 MG/ML: 140 | 28 days supply | Qty: 2 | Fill #2

## 2020-11-15 MED FILL — LANTUS SOLOSTAR 100 UNITS/M: 100 | 30 days supply | Qty: 6 | Fill #0

## 2020-11-28 ENCOUNTER — Ambulatory Visit (INDEPENDENT_AMBULATORY_CARE_PROVIDER_SITE_OTHER): Payer: No Typology Code available for payment source | Admitting: Podiatry

## 2020-11-28 ENCOUNTER — Other Ambulatory Visit: Payer: Self-pay | Admitting: Podiatry

## 2020-11-28 ENCOUNTER — Other Ambulatory Visit: Payer: Self-pay

## 2020-11-28 ENCOUNTER — Encounter: Payer: Self-pay | Admitting: Podiatry

## 2020-11-28 DIAGNOSIS — L97421 Non-pressure chronic ulcer of left heel and midfoot limited to breakdown of skin: Secondary | ICD-10-CM

## 2020-11-28 DIAGNOSIS — L03116 Cellulitis of left lower limb: Secondary | ICD-10-CM | POA: Diagnosis not present

## 2020-11-28 DIAGNOSIS — E08621 Diabetes mellitus due to underlying condition with foot ulcer: Secondary | ICD-10-CM | POA: Diagnosis not present

## 2020-11-28 DIAGNOSIS — L97511 Non-pressure chronic ulcer of other part of right foot limited to breakdown of skin: Secondary | ICD-10-CM

## 2020-11-28 MED ORDER — DOXYCYCLINE HYCLATE 100 MG PO TABS: 100.0000 mg | ORAL_TABLET | Freq: Two times a day (BID) | ORAL | 0 refills | Status: DC

## 2020-11-28 MED FILL — LANTUS SOLOSTAR 100 UNITS/M: 100 | 30 days supply | Qty: 6 | Fill #0

## 2020-11-28 MED FILL — SPIRONOLACTONE 50 MG TABS: 50 | 30 days supply | Qty: 30 | Fill #4

## 2020-11-28 MED FILL — REPATHA 140 MG/ML SOSY: 140 | 28 days supply | Qty: 2 | Fill #0

## 2020-11-28 MED FILL — FUROSEMIDE 40 MG TAB: 40 | 30 days supply | Qty: 30 | Fill #3

## 2020-11-28 MED FILL — DOXYCYCLINE HYCLATE 100 MG: 100 | 7 days supply | Qty: 14 | Fill #0

## 2020-11-28 MED FILL — TRUEPLUS PEN NDL 31GX3/16: 31G X 5 MM | 25 days supply | Qty: 100 | Fill #0

## 2020-11-28 MED FILL — DIGOXIN 0.125 MG TABLET: 125 | 30 days supply | Qty: 30 | Fill #1

## 2020-11-28 NOTE — Progress Notes (Signed)
  Subjective:  Patient ID: [name], male    DOB: [birth year],  MRN: [id number]  Chief Complaint  Patient presents with  . Wound Check    Denies fever/nausea/vomiting/chills. Pt states concern of left ankle swelling, no known injuries, swelling was transient and mild, since resolved.    49 y.o. male presents for wound care. Hx confirmed with patient. Hx as above Objective:  Physical Exam: Wound Location: bilateral 5th MPJ Wound Measurement: 0.2x0.2 right, 2.5x1 left, more plantar than lateral Wound Base: Granular/Healthy Peri-wound: Normal Exudate: Scant/small amount Serosanguinous exudate wound without warmth, erythema, signs of acute infection  Assessment:   1. 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 Diabetic ulcer of other part of right foot associated with diabetes mellitus due to underlying condition, limited to breakdown of skin (HCC)   3. Cellulitis of left foot    Plan:  Patient was evaluated and treated and all questions answered.  Ulcer bilateral feet -Debrided as below -Continue santyl bilateral -Offloaded right foot insole. -Wound slightly worsened on the left side.  Debrided as below and dressed with Betadine wet-to-dry.  Resume Santyl wet-to-dry -He does have slight worsening redness will cover with doxycycline  Procedure: Excisional Debridement of Wound Indication: Removal of non-viable soft tissue from the wound to promote healing.  Anesthesia: none Pre-Debridement Wound Measurements: 0.5 cm x 1 cm x 0.2 cm  Post-Debridement Wound Measurements: 1 cm x 1 cm x 0.2 cm  Type of Debridement: Sharp Excisional Tissue Removed: Non-viable soft tissue Instrumentation: 15 blade and tissue nipper Depth of Debridement: subcutaneous tissue. Technique: Sharp excisional debridement to bleeding, viable wound base.  Dressing: Dry, sterile, compression dressing. Disposition:  Patient tolerated procedure well. Patient to return in 1 week for follow-up.        No follow-ups on file.

## 2020-12-08 ENCOUNTER — Other Ambulatory Visit: Payer: Self-pay | Admitting: Podiatry

## 2020-12-08 ENCOUNTER — Encounter: Payer: Self-pay | Admitting: Podiatry

## 2020-12-08 ENCOUNTER — Other Ambulatory Visit: Payer: No Typology Code available for payment source | Admitting: *Deleted

## 2020-12-08 ENCOUNTER — Ambulatory Visit (INDEPENDENT_AMBULATORY_CARE_PROVIDER_SITE_OTHER): Payer: No Typology Code available for payment source | Admitting: Podiatry

## 2020-12-08 ENCOUNTER — Other Ambulatory Visit: Payer: Self-pay

## 2020-12-08 DIAGNOSIS — E08621 Diabetes mellitus due to underlying condition with foot ulcer: Secondary | ICD-10-CM | POA: Diagnosis not present

## 2020-12-08 DIAGNOSIS — L97421 Non-pressure chronic ulcer of left heel and midfoot limited to breakdown of skin: Secondary | ICD-10-CM | POA: Diagnosis not present

## 2020-12-08 DIAGNOSIS — L02619 Cutaneous abscess of unspecified foot: Secondary | ICD-10-CM

## 2020-12-08 DIAGNOSIS — L03119 Cellulitis of unspecified part of limb: Secondary | ICD-10-CM | POA: Diagnosis not present

## 2020-12-08 DIAGNOSIS — E782 Mixed hyperlipidemia: Secondary | ICD-10-CM

## 2020-12-08 LAB — HEPATIC FUNCTION PANEL
ALT: 20 IU/L (ref 0–44)
AST: 16 IU/L (ref 0–40)
Albumin: 4.2 g/dL (ref 4.0–5.0)
Alkaline Phosphatase: 64 IU/L (ref 44–121)
Bilirubin Total: 0.4 mg/dL (ref 0.0–1.2)
Bilirubin, Direct: 0.12 mg/dL (ref 0.00–0.40)
Total Protein: 7 g/dL (ref 6.0–8.5)

## 2020-12-08 LAB — LIPID PANEL
Chol/HDL Ratio: 2.8 ratio (ref 0.0–5.0)
Cholesterol, Total: 105 mg/dL (ref 100–199)
HDL: 37 mg/dL — ABNORMAL LOW (ref >39)
LDL Chol Calc (NIH): 45 mg/dL (ref 0–99)
Triglycerides: 126 mg/dL (ref 0–149)
VLDL Cholesterol Cal: 23 mg/dL (ref 5–40)

## 2020-12-08 MED ORDER — DOXYCYCLINE HYCLATE 100 MG PO TABS: 100.0000 mg | ORAL_TABLET | Freq: Two times a day (BID) | ORAL | 0 refills | Status: DC

## 2020-12-08 MED FILL — ATORVASTATIN 80 MG TABLET: 80 | 30 days supply | Qty: 30 | Fill #1

## 2020-12-08 MED FILL — OMEPRAZOLE DR 20 MG CAPSULE: 20 | 30 days supply | Qty: 30 | Fill #0

## 2020-12-08 MED FILL — DOXYCYCLINE HYCLATE 100 MG: 100 | 7 days supply | Qty: 14 | Fill #0

## 2020-12-08 NOTE — H&P (View-Only) (Signed)
  Subjective:  Patient ID: [name], male    DOB: May 03, 1971,  MRN: [id number]  No chief complaint on file.  49 y.o. male presents for wound care. Hx confirmed with patient. Completed his antibiotics. Applying regular dressings with santyl as directed. Objective:  Physical Exam: Wound Location: bilateral 5th MPJ Wound Measurement: 0.2x0.2 right, 3x1, more plantar than lateral Wound Base: Granular/Healthy Peri-wound: Normal Exudate: Scant/small amount Serosanguinous exudate wound without warmth, erythema, signs of acute infection  Assessment:   1. 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 Cellulitis and abscess of foot    Plan:  Patient was evaluated and treated and all questions answered.  Ulcer bilateral feet -Debrided as below -Continue santyl left -Wound improved bilat, but still red left.. -Given persistent redness will order MRI to evaluate. R/o Osteomyeltis.  Procedure: Excisional Debridement of Wound Indication: Removal of non-viable soft tissue from the wound to promote healing.  Anesthesia: none Pre-Debridement Wound Measurements: 2.5 cm x 1 cm x 0.2 cm  Post-Debridement Wound Measurements: 3 cm x 1 cm x 0.2 cm  Type of Debridement: Sharp Excisional Tissue Removed: Non-viable soft tissue Instrumentation: 15 blade and tissue nipper Depth of Debridement: subcutaneous tissue. Technique: Sharp excisional debridement to bleeding, viable wound base.  Dressing: Dry, sterile, compression dressing. Disposition: Patient tolerated procedure well. Patient to return in 1 week for follow-up.            No follow-ups on file.

## 2020-12-08 NOTE — Progress Notes (Signed)
  Subjective:  Patient ID: [name], male    DOB: 10/17/1971,  MRN: [id number]  No chief complaint on file.  49 y.o. male presents for wound care. Hx confirmed with patient. Completed his antibiotics. Applying regular dressings with santyl as directed. Objective:  Physical Exam: Wound Location: bilateral 5th MPJ Wound Measurement: 0.2x0.2 right, 3x1, more plantar than lateral Wound Base: Granular/Healthy Peri-wound: Normal Exudate: Scant/small amount Serosanguinous exudate wound without warmth, erythema, signs of acute infection  Assessment:   1. 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 Cellulitis and abscess of foot    Plan:  Patient was evaluated and treated and all questions answered.  Ulcer bilateral feet -Debrided as below -Continue santyl left -Wound improved bilat, but still red left.. -Given persistent redness will order MRI to evaluate. R/o Osteomyeltis.  Procedure: Excisional Debridement of Wound Indication: Removal of non-viable soft tissue from the wound to promote healing.  Anesthesia: none Pre-Debridement Wound Measurements: 2.5 cm x 1 cm x 0.2 cm  Post-Debridement Wound Measurements: 3 cm x 1 cm x 0.2 cm  Type of Debridement: Sharp Excisional Tissue Removed: Non-viable soft tissue Instrumentation: 15 blade and tissue nipper Depth of Debridement: subcutaneous tissue. Technique: Sharp excisional debridement to bleeding, viable wound base.  Dressing: Dry, sterile, compression dressing. Disposition: Patient tolerated procedure well. Patient to return in 1 week for follow-up.            No follow-ups on file.  

## 2020-12-08 NOTE — Addendum Note (Signed)
Addended by: Wendelyn Kiesling E on: 12/08/2020 07:56 AM   Modules accepted: Orders

## 2020-12-13 ENCOUNTER — Other Ambulatory Visit: Payer: Self-pay

## 2020-12-13 ENCOUNTER — Ambulatory Visit
Admission: RE | Admit: 2020-12-13 | Discharge: 2020-12-13 | Disposition: A | Discharge status: Home / Self Care | Payer: No Typology Code available for payment source | Source: Ambulatory Visit | Attending: Podiatry | Admitting: Podiatry

## 2020-12-13 DIAGNOSIS — L97421 Non-pressure chronic ulcer of left heel and midfoot limited to breakdown of skin: Secondary | ICD-10-CM

## 2020-12-15 ENCOUNTER — Ambulatory Visit: Payer: Self-pay | Admitting: Podiatry

## 2020-12-15 ENCOUNTER — Ambulatory Visit (INDEPENDENT_AMBULATORY_CARE_PROVIDER_SITE_OTHER): Payer: No Typology Code available for payment source | Admitting: Podiatry

## 2020-12-15 ENCOUNTER — Other Ambulatory Visit: Payer: Self-pay

## 2020-12-15 ENCOUNTER — Encounter: Payer: Self-pay | Admitting: Podiatry

## 2020-12-15 DIAGNOSIS — L03119 Cellulitis of unspecified part of limb: Secondary | ICD-10-CM

## 2020-12-15 DIAGNOSIS — E1122 Type 2 diabetes mellitus with diabetic chronic kidney disease: Secondary | ICD-10-CM | POA: Diagnosis not present

## 2020-12-15 DIAGNOSIS — M86272 Subacute osteomyelitis, left ankle and foot: Secondary | ICD-10-CM | POA: Diagnosis not present

## 2020-12-15 DIAGNOSIS — IMO0002 Reserved for concepts with insufficient information to code with codable children: Secondary | ICD-10-CM

## 2020-12-15 DIAGNOSIS — N183 Chronic kidney disease, stage 3 unspecified: Secondary | ICD-10-CM

## 2020-12-15 DIAGNOSIS — L02619 Cutaneous abscess of unspecified foot: Secondary | ICD-10-CM | POA: Diagnosis not present

## 2020-12-15 DIAGNOSIS — E08621 Diabetes mellitus due to underlying condition with foot ulcer: Secondary | ICD-10-CM | POA: Diagnosis not present

## 2020-12-15 DIAGNOSIS — Z794 Long term (current) use of insulin: Secondary | ICD-10-CM

## 2020-12-15 DIAGNOSIS — E1165 Type 2 diabetes mellitus with hyperglycemia: Secondary | ICD-10-CM

## 2020-12-15 DIAGNOSIS — L97421 Non-pressure chronic ulcer of left heel and midfoot limited to breakdown of skin: Secondary | ICD-10-CM | POA: Diagnosis not present

## 2020-12-15 MED FILL — CARVEDILOL 6.25 MG TABLET: 6.25 | 30 days supply | Qty: 60 | Fill #1

## 2020-12-15 NOTE — Progress Notes (Signed)
  Subjective:  Patient ID: [name], male    DOB: [birth year],  MRN: [id number]  Chief Complaint  Patient presents with  . Wound Check    MRI results, denies fever/chills/nausea/vomiting. No acute concerns.   49 y.o. male presents for wound care. Hx confirmed with patient. Had MRI performed and here for review. Thinks the redness is doing better. Objective:  Physical Exam: Wound Location: bilateral 5th MPJ Wound Measurement: 0.2x0.2 right, 3x1, more plantar than lateral Wound Base: Granular/Healthy Peri-wound: Normal Exudate: Scant/small amount Serosanguinous exudate wound without warmth, erythema, signs of acute infection  Assessment:   1. 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 Cellulitis and abscess of foot   3. Subacute osteomyelitis of left foot (HCC)   4. Uncontrolled type 2 diabetes mellitus with stage 3 chronic kidney disease, with long-term current use of insulin (HCC)    Plan:  Patient was evaluated and treated and all questions answered.  Ulcer bilateral feet -MRI reviewed with patient. -Continue Santyl. -No debridement today -Silvadene and Mepilex border dressing applied today. -At this point I think most prudent way to proceed would be for a bone biopsy. If present the bone infection is likely acute on chronic, and I think could be amenable to IV abx. -Patient has failed all conservative therapy and wishes to proceed with surgical intervention. All risks, benefits, and alternatives discussed with patient. No guarantees given. Consent reviewed and signed by patient. -Planned procedures: Left foot bone biopsy left 4th/5th metatarsals, 4th proximal phalanx as indicated. 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 -Needs clearance by his cardiologist prior to procedure.     No follow-ups on  file.

## 2020-12-15 NOTE — Patient Instructions (Addendum)
DUE TO COVID-19 ONLY ONE VISITOR IS ALLOWED TO COME WITH YOU AND STAY IN THE WAITING ROOM ONLY DURING PRE OP AND PROCEDURE.   IF YOU WILL BE ADMITTED INTO THE HOSPITAL YOU ARE ALLOWED ONE SUPPORT PERSON DURING VISITATION HOURS ONLY (10AM -8PM)   . The support person may change daily. . The support person must pass our screening, gel in and out, and wear a mask at all times, including in the patient's room. . Patients must also wear a mask when staff or their support person are in the room.   COVID SWAB TESTING MUST BE COMPLETED ON:  Monday, 12-18-20 @ 12:05 PM   [address] [place], [place] [id number]  (Must self quarantine after testing. Follow instructions on handout.)        Your procedure is scheduled on:  Wednesday, 12-20-20   Report to [facility] Main  Entrance   Report to Short Stay at 5:30 AM   [facility](Map Enclosed)    Call this number if you have problems the morning of surgery [phone]   Do not eat food :After Midnight.   May have liquids until 4:15 AM day of surgery  CLEAR LIQUID DIET  Foods Allowed                                                                     Foods Excluded  Water, Black Coffee and tea, regular and decaf                 liquids that you cannot  Plain Jell-O in any flavor  (No red)                                        see through such as: Fruit ices (not with fruit pulp)                                      milk, soups, orange juice              Iced Popsicles (No red)                                      All solid food                                   Apple juices Sports drinks like Gatorade (No red) Lightly seasoned clear broth or consume(fat free) Sugar, honey syrup     Complete one G2 drink the morning of surgery at 4:15 AM the day of surgery.  Oral Hygiene is also important to reduce your risk of infection.                                     Remember - BRUSH YOUR TEETH THE MORNING OF SURGERY WITH YOUR REGULAR  TOOTHPASTE   Do  NOT smoke after Midnight   Take these medicines the morning of surgery with A SIP OF WATER:  Carvedilol, Digoxin, Omeprazole  How to Manage Your Diabetes Before and After Surgery  Why is it important to control my blood sugar before and after surgery? . Improving blood sugar levels before and after surgery helps healing and can limit problems. . A way of improving blood sugar control is eating a healthy diet by: o  Eating less sugar and carbohydrates o  Increasing activity/exercise o  Talking with your doctor about reaching your blood sugar goals . High blood sugars (greater than 180 mg/dL) can raise your risk of infections and slow your recovery, so you will need to focus on controlling your diabetes during the weeks before surgery. . Make sure that the doctor who takes care of your diabetes knows about your planned surgery including the date and location.  How do I manage my blood sugar before surgery? . Check your blood sugar at least 4 times a day, starting 2 days before surgery, to make sure that the level is not too high or low. o Check your blood sugar the morning of your surgery when you wake up and every 2 hours until you get to the Short Stay unit. . If your blood sugar is less than 70 mg/dL, you will need to treat for low blood sugar: o Do not take insulin. o Treat a low blood sugar (less than 70 mg/dL) with  cup of clear juice (cranberry or apple), 4 glucose tablets, OR glucose gel. o Recheck blood sugar in 15 minutes after treatment (to make sure it is greater than 70 mg/dL). If your blood sugar is not greater than 70 mg/dL on recheck, call [phone] for further instructions. . Report your blood sugar to the short stay nurse when you get to Short Stay.  . If you are admitted to the hospital after surgery: o Your blood sugar will be checked by the staff and you will probably be given insulin after surgery (instead of oral diabetes medicines) to make sure  you have good blood sugar levels. o The goal for blood sugar control after surgery is 80-180 mg/dL.   WHAT DO I DO ABOUT MY DIABETES MEDICATION?  [name] Do not take oral diabetes medicines (pills) the morning of surgery.  . THE NIGHT BEFORE SURGERY:  Do not take Farxiga             Take Glipzide as prescribed (do not take evening dose)             Insulin Glargine - take 100% if am, 50% if dinner/bedtime.       . THE MORNING OF SURGERY:  Do not take [place] or Glipizide        Take 50% of Insulin Glargine if take in the am.   Reviewed and Endorsed by [facility] Patient Education Committee, August 2015                                You may not have any metal on your body including jewelry, and body piercings             Do not wear lotions, powders, perfumes/cologne, or deodorant             Men may shave face and neck.   Do not bring valuables to the hospital. [facility] IS NOT RESPONSIBLE   FOR VALUABLES.  Contacts, dentures or bridgework may not be worn into surgery.     Patients discharged the day of surgery will not be allowed to drive home.               Please read over the following fact sheets you were given: IF YOU HAVE QUESTIONS ABOUT YOUR PRE OP INSTRUCTIONS PLEASE CALL [phone]   [facility] - Preparing for Surgery Before surgery, you can play an important role.  Because skin is not sterile, your skin needs to be as free of germs as possible.  You can reduce the number of germs on your skin by washing with CHG (chlorahexidine gluconate) soap before surgery.  CHG is an antiseptic cleaner which kills germs and bonds with the skin to continue killing germs even after washing. Please DO NOT use if you have an allergy to CHG or antibacterial soaps.  If your skin becomes reddened/irritated stop using the CHG and inform your nurse when you arrive at Short Stay. Do not shave (including legs and underarms) for at least 48 hours prior to the first CHG shower.  You may shave  your face/neck.  Please follow these instructions carefully:  1.  Shower with CHG Soap the night before surgery and the  morning of surgery.  2.  If you choose to wash your hair, wash your hair first as usual with your normal  shampoo.  3.  After you shampoo, rinse your hair and body thoroughly to remove the shampoo.                             4.  Use CHG as you would any other liquid soap.  You can apply chg directly to the skin and wash.  Gently with a scrungie or clean washcloth.  5.  Apply the CHG Soap to your body ONLY FROM THE NECK DOWN.   Do   not use on face/ open                           Wound or open sores. Avoid contact with eyes, ears mouth and   genitals (private parts).                       Wash face,  Genitals (private parts) with your normal soap.             6.  Wash thoroughly, paying special attention to the area where your    surgery  will be performed.  7.  Thoroughly rinse your body with warm water from the neck down.  8.  DO NOT shower/wash with your normal soap after using and rinsing off the CHG Soap.                9.  Pat yourself dry with a clean towel.            10.  Wear clean pajamas.            11.  Place clean sheets on your bed the night of your first shower and do not  sleep with pets. Day of Surgery : Do not apply any lotions/deodorants the morning of surgery.  Please wear clean clothes to the hospital/surgery center.  FAILURE TO FOLLOW THESE INSTRUCTIONS MAY RESULT IN THE CANCELLATION OF YOUR SURGERY  PATIENT SIGNATURE_________________________________  NURSE SIGNATURE__________________________________  ________________________________________________________________________   Rogelia Mire  An incentive  spirometer is a tool that can help keep your lungs clear and active. This tool measures how well you are filling your lungs with each breath. Taking long deep breaths may help reverse or decrease the chance of developing breathing  (pulmonary) problems (especially infection) following:  A long period of time when you are unable to move or be active. BEFORE THE PROCEDURE   If the spirometer includes an indicator to show your best effort, your nurse or respiratory therapist will set it to a desired goal.  If possible, sit up straight or lean slightly forward. Try not to slouch.  Hold the incentive spirometer in an upright position. INSTRUCTIONS FOR USE  1. Sit on the edge of your bed if possible, or sit up as far as you can in bed or on a chair. 2. Hold the incentive spirometer in an upright position. 3. Breathe out normally. 4. Place the mouthpiece in your mouth and seal your lips tightly around it. 5. Breathe in slowly and as deeply as possible, raising the piston or the ball toward the top of the column. 6. Hold your breath for 3-5 seconds or for as long as possible. Allow the piston or ball to fall to the bottom of the column. 7. Remove the mouthpiece from your mouth and breathe out normally. 8. Rest for a few seconds and repeat Steps 1 through 7 at least 10 times every 1-2 hours when you are awake. Take your time and take a few normal breaths between deep breaths. 9. The spirometer may include an indicator to show your best effort. Use the indicator as a goal to work toward during each repetition. 10. After each set of 10 deep breaths, practice coughing to be sure your lungs are clear. If you have an incision (the cut made at the time of surgery), support your incision when coughing by placing a pillow or rolled up towels firmly against it. Once you are able to get out of bed, walk around indoors and cough well. You may stop using the incentive spirometer when instructed by your caregiver.  RISKS AND COMPLICATIONS  Take your time so you do not get dizzy or light-headed.  If you are in pain, you may need to take or ask for pain medication before doing incentive spirometry. It is harder to take a deep breath if you  are having pain. AFTER USE  Rest and breathe slowly and easily.  It can be helpful to keep track of a log of your progress. Your caregiver can provide you with a simple table to help with this. If you are using the spirometer at home, follow these instructions: SEEK MEDICAL CARE IF:   You are having difficultly using the spirometer.  You have trouble using the spirometer as often as instructed.  Your pain medication is not giving enough relief while using the spirometer.  You develop fever of 100.5 F (38.1 C) or higher. SEEK IMMEDIATE MEDICAL CARE IF:   You cough up bloody sputum that had not been present before.  You develop fever of 102 F (38.9 C) or greater.  You develop worsening pain at or near the incision site. MAKE SURE YOU:   Understand these instructions.  Will watch your condition.  Will get help right away if you are not doing well or get worse. Document Released: 04/28/2007 Document Revised: 03/09/2012 Document Reviewed: 06/29/2007 [facility] Patient Information 2014 [place], [place].   ________________________________________________________________________

## 2020-12-15 NOTE — Progress Notes (Signed)
COVID Vaccine Completed: Date COVID Vaccine completed: COVID vaccine manufacturer: Pfizer    Moderna   [employer]   PCP -  Hoy Register, MD Cardiologist - Rollene Rotunda, MD Advanced Heart Failure - Marca Ancona, MD    Chest x-ray -  EKG - 05-26-20 in Epic Stress Test -  ECHO - 08-31-19 in Epic Cardiac Cath - 10-25-16 in Epic Pacemaker/ICD device last checked:  Sleep Study -  CPAP -   Fasting Blood Sugar -  Checks Blood Sugar _____ times a day  Blood Thinner Instructions: Aspirin Instructions:  ASA 81 mg  Last Dose:  Anesthesia review:  CAD, cardiomyopathy, CHF, hx of MI, HTN.  Cardiac stent placement 2017  Patient denies shortness of breath, fever, cough and chest pain at PAT appointment   Patient verbalized understanding of instructions that were given to them at the PAT appointment. Patient was also instructed that they will need to review over the PAT instructions again at home before surgery.

## 2020-12-18 ENCOUNTER — Encounter (HOSPITAL_COMMUNITY): Payer: Self-pay

## 2020-12-18 ENCOUNTER — Other Ambulatory Visit (HOSPITAL_COMMUNITY)
Admission: RE | Admit: 2020-12-18 | Discharge: 2020-12-18 | Disposition: A | Discharge status: Home / Self Care | Payer: No Typology Code available for payment source | Source: Ambulatory Visit | Attending: Podiatry | Admitting: Podiatry

## 2020-12-18 ENCOUNTER — Encounter (HOSPITAL_COMMUNITY)
Admission: RE | Admit: 2020-12-18 | Discharge: 2020-12-18 | Disposition: A | Discharge status: Home / Self Care | Payer: No Typology Code available for payment source | Source: Ambulatory Visit | Attending: Podiatry | Admitting: Podiatry

## 2020-12-18 ENCOUNTER — Inpatient Hospital Stay (HOSPITAL_COMMUNITY): Admission: RE | Admit: 2020-12-18 | Payer: No Typology Code available for payment source | Source: Ambulatory Visit

## 2020-12-18 ENCOUNTER — Other Ambulatory Visit: Payer: Self-pay

## 2020-12-18 ENCOUNTER — Telehealth: Payer: Self-pay | Admitting: Family Medicine

## 2020-12-18 DIAGNOSIS — Z01812 Encounter for preprocedural laboratory examination: Secondary | ICD-10-CM | POA: Insufficient documentation

## 2020-12-18 DIAGNOSIS — Z20822 Contact with and (suspected) exposure to covid-19: Secondary | ICD-10-CM | POA: Insufficient documentation

## 2020-12-18 HISTORY — DX: Acute myocardial infarction, unspecified: I21.9

## 2020-12-18 HISTORY — DX: Myoneural disorder, unspecified: G70.9

## 2020-12-18 LAB — BASIC METABOLIC PANEL
Anion gap: 11 (ref 5–15)
BUN: 26 mg/dL — ABNORMAL HIGH (ref 6–20)
CO2: 25 mmol/L (ref 22–32)
Calcium: 9.3 mg/dL (ref 8.9–10.3)
Chloride: 105 mmol/L (ref 98–111)
Creatinine, Ser: 1.33 mg/dL — ABNORMAL HIGH (ref 0.61–1.24)
GFR, Estimated: >60 mL/min (ref >60)
Glucose, Bld: 146 mg/dL — ABNORMAL HIGH (ref 70–99)
Potassium: 4.1 mmol/L (ref 3.5–5.1)
Sodium: 141 mmol/L (ref 135–145)

## 2020-12-18 LAB — CBC
HCT: 42.4 % (ref 39.0–52.0)
Hemoglobin: 13.8 g/dL (ref 13.0–17.0)
MCH: 27.1 pg (ref 26.0–34.0)
MCHC: 32.5 g/dL (ref 30.0–36.0)
MCV: 83.1 fL (ref 80.0–100.0)
Platelets: 222 10*3/uL (ref 150–400)
RBC: 5.1 MIL/uL (ref 4.22–5.81)
RDW: 13.5 % (ref 11.5–15.5)
WBC: 9.9 10*3/uL (ref 4.0–10.5)
nRBC: 0 % (ref 0.0–0.2)

## 2020-12-18 LAB — HEMOGLOBIN A1C
Hgb A1c MFr Bld: 6.7 % — ABNORMAL HIGH (ref 4.8–5.6)
Mean Plasma Glucose: 145.59 mg/dL

## 2020-12-18 LAB — GLUCOSE, CAPILLARY: Glucose-Capillary: 144 mg/dL — ABNORMAL HIGH (ref 70–99)

## 2020-12-18 LAB — SARS CORONAVIRUS 2 (TAT 6-24 HRS): SARS Coronavirus 2: NEGATIVE

## 2020-12-18 NOTE — Progress Notes (Addendum)
COVID Vaccine Completed:NO   PCP -  Hoy Register, MD Cardiologist - Rollene Rotunda, MD Advanced Heart Failure - Marca Ancona, MD LOVN 05-26-20 epic    Chest x-ray -  EKG - 05-26-20 in Epic Stress Test -  ECHO - 08-31-19 in Epic Cardiac Cath - 10-25-16 in Epic Pacemaker/ICD device last checked:  Sleep Study -  CPAP -   Fasting Blood Sugar - 125-150 Checks Blood Sugar __0_ CBG meter broke last few weeks__ times a day  Blood Thinner Instructions: Aspirin Instructions:  ASA 81 mg  Last Dose:  Activity- no SOB with working in yard or mailbox  Anesthesia review:  CAD, cardiomyopathy, CHF, hx of MI, HTN.  Cardiac stent placement 2017  Patient denies shortness of breath, fever, cough and chest pain at PAT appointment  NONE   Patient verbalized understanding of instructions that were given to them at the PAT appointment. Patient was also instructed that they will need to review over the PAT instructions again at home before surgery.

## 2020-12-18 NOTE — Telephone Encounter (Signed)
Noted matter will be addressed at patients office visit tomorrow.

## 2020-12-18 NOTE — Telephone Encounter (Signed)
FYI: Pt states he is having a simple surgery with his foot dr.  The dr is going to make an incision in the side of his foot to check for infection. His surgery is Wed am 12/20/20. He needs a surgical clearance form signed asap.  The dr did not give him much time to get this done.  Pt had his toe amputated in August, and sees his dr once a week The dr saw something concerning on MRI and called him Friday am and scheduled surgery for this Wed.  That is why there is urgency on this appt Pt scheduled with Dr Delford Field at 9 am Tuesday.

## 2020-12-19 ENCOUNTER — Other Ambulatory Visit: Payer: Self-pay

## 2020-12-19 ENCOUNTER — Encounter: Payer: Self-pay | Admitting: Critical Care Medicine

## 2020-12-19 ENCOUNTER — Ambulatory Visit
Payer: No Typology Code available for payment source | Attending: Critical Care Medicine | Admitting: Critical Care Medicine

## 2020-12-19 VITALS — BP 120/80 | HR 74 | Ht 78.0 in | Wt 265.0 lb

## 2020-12-19 DIAGNOSIS — E1122 Type 2 diabetes mellitus with diabetic chronic kidney disease: Secondary | ICD-10-CM

## 2020-12-19 DIAGNOSIS — E1169 Type 2 diabetes mellitus with other specified complication: Secondary | ICD-10-CM

## 2020-12-19 DIAGNOSIS — M869 Osteomyelitis, unspecified: Secondary | ICD-10-CM

## 2020-12-19 DIAGNOSIS — I5042 Chronic combined systolic (congestive) and diastolic (congestive) heart failure: Secondary | ICD-10-CM

## 2020-12-19 DIAGNOSIS — N183 Chronic kidney disease, stage 3 unspecified: Secondary | ICD-10-CM

## 2020-12-19 DIAGNOSIS — I10 Essential (primary) hypertension: Secondary | ICD-10-CM

## 2020-12-19 DIAGNOSIS — E669 Obesity, unspecified: Secondary | ICD-10-CM

## 2020-12-19 DIAGNOSIS — Z794 Long term (current) use of insulin: Secondary | ICD-10-CM

## 2020-12-19 DIAGNOSIS — G4710 Hypersomnia, unspecified: Secondary | ICD-10-CM

## 2020-12-19 DIAGNOSIS — E782 Mixed hyperlipidemia: Secondary | ICD-10-CM

## 2020-12-19 LAB — GLUCOSE, POCT (MANUAL RESULT ENTRY): POC Glucose: 138 mg/dl — AB (ref 70–99)

## 2020-12-19 MED FILL — REPATHA SURECLICK 140 MG/ML: 140 | 8 days supply | Qty: 2 | Fill #0

## 2020-12-19 NOTE — Assessment & Plan Note (Signed)
Type 2 diabetes with improved control associated chronic kidney disease which is stable now as well  No change in current medication profile patient is cleared for planned bone biopsies of the left foot on 12/20/2020

## 2020-12-19 NOTE — Assessment & Plan Note (Signed)
Hypertension controlled at this time no change in medications

## 2020-12-19 NOTE — Anesthesia Preprocedure Evaluation (Addendum)
Anesthesia Evaluation  Patient identified by MRN, date of birth, ID band Patient awake    Reviewed: Allergy & Precautions, NPO status , Patient's Chart, lab work & pertinent test results, reviewed documented beta blocker date and time   History of Anesthesia Complications Negative for: history of anesthetic complications  Airway Mallampati: II  TM Distance: >3 FB Neck ROM: Full    Dental  (+) Dental Advisory Given   Pulmonary neg pulmonary ROS,    Pulmonary exam normal        Cardiovascular hypertension, Pt. on medications and Pt. on home beta blockers + CAD, + Past MI, + Cardiac Stents and +CHF  Normal cardiovascular exam   '20 TTE - EF 40-45%. LV Diastolic Doppler parameters are consistent with impaired relaxation. Trivial pericardial effusion is present.     Neuro/Psych negative neurological ROS  negative psych ROS   GI/Hepatic Neg liver ROS, GERD  Medicated and Controlled,  Endo/Other  diabetes, Type 2, Insulin Dependent Obesity   Renal/GU Renal InsufficiencyRenal disease     Musculoskeletal negative musculoskeletal ROS (+)   Abdominal (+) + obese,   Peds  Hematology negative hematology ROS (+)   Anesthesia Other Findings Covid test negative   Reproductive/Obstetrics                           Anesthesia Physical Anesthesia Plan  ASA: III  Anesthesia Plan: MAC   Post-op Pain Management:    Induction: Intravenous  PONV Risk Score and Plan: 1 and Propofol infusion and Treatment may vary due to age or medical condition  Airway Management Planned: Natural Airway and Simple Face Mask  Additional Equipment: None  Intra-op Plan:   Post-operative Plan:   Informed Consent: I have reviewed the patients History and Physical, chart, labs and discussed the procedure including the risks, benefits and alternatives for the proposed anesthesia with the patient or authorized  representative who has indicated his/her understanding and acceptance.       Plan Discussed with: CRNA and Anesthesiologist  Anesthesia Plan Comments:       Anesthesia Quick Evaluation

## 2020-12-19 NOTE — Progress Notes (Signed)
Anesthesia Chart Review   Case: [id number] Date/Time: 12/20/20 0700   Procedures:      BONE BIOPSY X 3 (Left )     DEBRIDEMENT WOUND AND POSSIBLE CLOSURE (Left )   Anesthesia type: Choice   Pre-op diagnosis: ULCER LEFT FOOT,CELLULITIS AND ABCESS OF LEFT FOOT,AND OSTEOMYLETIS   Location: [facility] ROOM 06 / [facility]   Surgeons: Park Liter, DPM      DISCUSSION:48 y.o. never smoker with h/o HTN, GERD, DM II (last A1C 6.7), CKD Stage III, CHF (EF 40-45% on Echo 08/31/2019), CAD (stents), left foot ulcer, osteomyelitis scheduled for above procedure 12/20/20 with [name], DPM.   Pt last seen by PCP 12/19/20 for preoperative assessment (H&P). Per OV note, "No change in current medication profile patient is cleared for planned bone biopsies of the left foot on 12/20/2020. Cardiac status is stable the patient is cleared for planned anesthesia for left foot bone biopsies on 12/21/2019"  S/p left foot I&D 07/14/20. Per anesthesia chart review prior to this procedure, "Follows with cardiology for hx of CAD and ischemic cardiomyopathy. He initially had NSTEMI in 10/12 with DES to proximal LAD. At that time, EF was noted to be down to 15%. Over time, EF improved to 50% (3/13 echo). Echo in 10/17 showed EF back down to 10% with severe functional MR. He was taken for cardiac cath. 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 He had DES x 2 to LCx/OM2 and had a staged procedure with DES to distal RCA/ostial PDA. Most recent echo 08/31/19 showed EF 40-45%. Last seen by Dr. Shirlee Latch 05/26/20, NYHA class II at that time, no fluid overload. Pt cleared for surgery per telephone encounter from Dr. Shirlee Latch 07/10/20."  Anticipate pt can proceed with planned procedure barring acute status change.   VS: BP 113/66   Pulse 78   Temp 37.1 C (Oral)   Resp 16   Ht 6\' 6"  (1.981 m)   Wt 117.5 kg   SpO2 100%   BMI 29.93 kg/m   PROVIDERS: , MD   LABS: Labs reviewed:  Acceptable for surgery. (all labs ordered are listed, but only abnormal results are displayed)  Labs Reviewed  HEMOGLOBIN A1C - Abnormal; Notable for the following components:      Result Value   Hgb A1c MFr Bld 6.7 (*)    All other components within normal limits  BASIC METABOLIC PANEL - Abnormal; Notable for the following components:   Glucose, Bld 146 (*)    BUN 26 (*)    Creatinine, Ser 1.33 (*)    All other components within normal limits  GLUCOSE, CAPILLARY - Abnormal; Notable for the following components:   Glucose-Capillary 144 (*)    All other components within normal limits  CBC     IMAGES:   EKG: 05/26/2020 Rate 83 bpm Normal sinus rhythm Inferior infarct , age undetermined Possible Anterolateral infarct , age undetermined Abnormal ECG No significant change since last tracing Confirmed by 05/28/2020 (Nicki Guadalajara) on  CV: Echo 08/31/2019 IMPRESSIONS    1. The left ventricle has mild-moderately reduced systolic function, with  an ejection fraction of 40-45%. The cavity size was normal. Left  ventricular diastolic Doppler parameters are consistent with impaired  relaxation.  2. The right ventricle has normal systolic function. The cavity was  normal. There is no increase in right ventricular wall thickness.  3. Trivial pericardial effusion is present.  4. Mild to moderate aortic annular calcification  noted.  5. The tricuspid valve is grossly normal.  6. The aorta is normal unless otherwise noted.  7. The aortic root and ascending aorta are normal in size and structure.  8. The atrial septum is grossly normal.  Past Medical History:  Diagnosis Date  . Acute systolic CHF (congestive heart failure) (HCC) 10/28/2016  . Acute systolic congestive heart failure (HCC) 11/04/2011  . CAD (coronary artery disease)    NSTEMI 10/2011: LHC 11/04/11: pLAD 90%, mLAD 60-70%, small D2 sub totally occluded at ostium, small OM1 90% ostial, 90% mid, mOM2 30%, oPL 80%, RCA  30%, dPDA 80%, EF 20% with ant AK.  PCI:  Promus DES to pLAD.  [name] CHF (congestive heart failure) (HCC)   . Chronic systolic heart failure (HCC)   . Diabetic ulcer of left midfoot associated with type 2 diabetes mellitus, with muscle involvement without evidence of necrosis (HCC)   . DM2 (diabetes mellitus, type 2) (HCC)    type 2  . GERD (gastroesophageal reflux disease)   . HTN (hypertension)   . Hyperlipidemia   . Ischemic cardiomyopathy    Echo 11/03/11: mod LVH, mild focal basal septal hypertrophy, EF 15%, grade 2 diast dysfxn, mild MR, mild to mod LAE, mild RVE, mild to mod reduced RVSF.  EF 3/5 50% by echo  . Myocardial infarction (HCC)    at 39   . Neuromuscular disorder (HCC)    neuropathy feet  . Obesity   . Ulcer of right foot (HCC) 06/22/2020    Past Surgical History:  Procedure Laterality Date  . AMPUTATION Left 06/23/2020   Procedure: AMPUTATION RAY 5th;  Surgeon: Vivi Barrack, DPM;  Location[facility] OR;  Service: Podiatry;  Laterality: Left;  . CARDIAC CATHETERIZATION N/A 10/25/2016   Procedure: Right/Left Heart Cath and Coronary Angiography;  Surgeon: Marykay Lex, MD;  Location: [facility] INVASIVE CV LAB;  Service: Cardiovascular;  Laterality: N/A;  . CARDIAC CATHETERIZATION N/A 10/25/2016   Procedure: Coronary Stent Intervention;  Surgeon: Marykay Lex, MD;  Location[facility] INVASIVE CV LAB;  Service: Cardiovascular;  Laterality: N/A;  . CARDIAC CATHETERIZATION N/A 10/28/2016   Procedure: Coronary Stent Intervention;  Surgeon: Kathleene Hazel, MD;  Location: [facility] INVASIVE CV LAB;  Service: Cardiovascular;  Laterality: N/A;  . GRAFT APPLICATION Left 07/14/2020   Procedure: APPLICATION OF SKIN GRAFT USING INTE[employer]RA BILAYER MATRIX WOUND DRESSING;  Surgeon: Park Liter, DPM;  Location: [facility] OR;  Service: Podiatry;  Laterality: Left;  . I & D EXTREMITY Left 07/14/2020   Procedure: IRRIGATION AND DEBRIDEMENT LEFT FOOT;  Surgeon: Park Liter, DPM;  Location: [facility] OR;   Service: Podiatry;  Laterality: Left;  . LEFT AND RIGHT HEART CATHETERIZATION WITH CORONARY ANGIOGRAM N/A 11/04/2011   Procedure: LEFT AND RIGHT HEART CATHETERIZATION WITH CORONARY ANGIOGRAM;  Surgeon: Rollene Rotunda, MD;  Location[facility] CATH LAB;  Service: Cardiovascular;  Laterality: N/A;  . NONE    . PERCUTANEOUS CORONARY STENT INTERVENTION (PCI-S)  11/04/2011   Procedure: PERCUTANEOUS CORONARY STENT INTERVENTION (PCI-S);  Surgeon: Tonny Bollman, MD;  Location: [facility] CATH LAB;  Service: Cardiovascular;;  . WOUND DEBRIDEMENT Left 06/26/2020   Procedure: LEFT FOOT WOUND DEBRIDEMENT AND GRAFT APPLICATION;  Surgeon: Vivi Barrack, DPM;  Location: [facility] OR;  Service: Podiatry;  Laterality: Left;    MEDICATIONS: . aspirin 81 MG chewable tablet  . atorvastatin (LIPITOR) 80 MG tablet  . Blood Glucose Monitoring Suppl (TRUE METRIX METER) DEVI  . carvedilol (COREG) 6.25 MG tablet  . cetirizine (ZYRTEC)  10 MG tablet  . ciprofloxacin (CIPRO) 500 MG tablet  . clindamycin (CLEOCIN) 300 MG capsule  . collagenase (SANTYL) ointment  . dapagliflozin propanediol (FARXIGA) 10 MG TABS tablet  . digoxin (LANOXIN) 0.125 MG tablet  . doxycycline (VIBRA-TABS) 100 MG tablet  . Evolocumab (REPATHA SURECLICK) 140 MG/ML SOAJ  . furosemide (LASIX) 40 MG tablet  . glipiZIDE (GLUCOTROL) 10 MG tablet  . glucose blood (TRUE METRIX BLOOD GLUCOSE TEST) test strip  . HYDROcodone-acetaminophen (NORCO/VICODIN) 5-325 MG tablet  . insulin glargine (LANTUS SOLOSTAR) 100 UNIT/ML Solostar Pen  . Insulin Pen Needle (TRUEPLUS PEN NEEDLES) 31G X 5 MM MISC  . omeprazole (PRILOSEC) 20 MG capsule  . polyethylene glycol (MIRALAX / GLYCOLAX) 17 g packet  . sacubitril-valsartan (ENTRESTO) 97-103 MG  . sildenafil (REVATIO) 20 MG tablet  . silver sulfADIAZINE (SILVADENE) 1 % cream  . spironolactone (ALDACTONE) 50 MG tablet  . TRUEplus Lancets 28G MISC   No current facility-administered medications for this encounter.      Jodell Cipro, PA-C [facility] Pre-Surgical Testing [phone]

## 2020-12-19 NOTE — Progress Notes (Signed)
Pt is here today for surgery clearance.

## 2020-12-19 NOTE — Progress Notes (Signed)
Subjective:    Patient ID: [name], male    DOB: 1971-10-07, 49 y.o.   MRN: [id number]  48 y.o.M PCP [place]  Needs preop clearance hx of CHF, CAD, T2DM, HTN, obesity Hx Left foot DM ulcer, needs bone biopsy of Left foot  12/22 per Dr March Rummage  This patient is seen today for preop evaluation for planned left foot bone biopsy of the residual head of the left fifth metatarsal.  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  The patient currently has no complaints.  He has a history of ischemic cardiomyopathy ejection fraction 45% noted in 2020.  He has had previous myocardial infarction with combined systolic diastolic heart failure hypertension type 2 diabetes  Patient's had cardiac stents in place and is on therapy through the advanced heart failure clinic and has been stable in this regard.  He has no active chest pain or shortness of breath.  His preop labs done yesterday are stable.  From a diabetes perspective his A1c is 6.7 as of yesterday and blood sugar on arrival 140.  Patient had a diabetic ulcer of the left midfoot which had been treated now has issues with the left forefoot in the fifth metatarsal.  He was to have a peripheral vascular artery study but this is yet to be scheduled.  He also was requested to have a home study but did not have insurance now he does have insurance and is interested in getting a sleep study  On arrival blood pressure is 120/80   Past Medical History:  Diagnosis Date  . Acute systolic CHF (congestive heart failure) ([place]) 10/28/2016  . Acute systolic congestive heart failure ([place]) 11/04/2011  . CAD (coronary artery disease)    NSTEMI 10/2011: LHC 11/04/11: pLAD 90%, mLAD 60-70%, small D2 sub totally occluded at ostium, small OM1 90% ostial, 90% mid, mOM2 30%, oPL 80%, RCA 30%, dPDA 80%, EF 20% with ant AK.   PCI:  Promus DES to pLAD.  [name] CHF (congestive heart failure) ([place])   . Chronic systolic heart failure ([place])   . Diabetic ulcer of left midfoot associated with type 2 diabetes mellitus, with muscle involvement without evidence of necrosis ([place])   . DM2 (diabetes mellitus, type 2) ([place])    type 2  . GERD (gastroesophageal reflux disease)   . HTN (hypertension)   . Hyperlipidemia   . Ischemic cardiomyopathy    Echo 11/03/11: mod LVH, mild focal basal septal hypertrophy, EF 15%, grade 2 diast dysfxn, mild MR, mild to mod LAE, mild RVE, mild to mod reduced RVSF.  EF 3/5 50% by echo  . Myocardial infarction ([place])    at 76   . Neuromuscular disorder (HCC)    neuropathy feet  . Obesity   . Ulcer of right foot ([place]) 06/22/2020     Family History  Problem Relation Age of Onset  . Heart disease Father   . Heart disease Mother        MOTHER HAD CABG     Social History   Socioeconomic History  . Marital status: Single    Spouse name: Not on file  . Number of children: Not on file  . Years of education: Not on file  . Highest education level: Not on file  Occupational History  . Not on file  Tobacco Use  . Smoking status: Never Smoker  .  Smokeless tobacco: Never Used  Vaping Use  . Vaping Use: Never used  Substance and Sexual Activity  . Alcohol use: Not Currently    Alcohol/week: 1.0 standard drink    Types: 1 Shots of liquor per week  . Drug use: No  . Sexual activity: Yes    Birth control/protection: None  Other Topics Concern  . Not on file  Social History Narrative  . Not on file   Social Determinants of Health   Financial Resource Strain: Low Risk   . Difficulty of Paying Living Expenses: Not very hard  Food Insecurity: No Food Insecurity  . Worried About Charity fundraiser in the Last Year: Never true  . Ran Out of Food in the Last Year: Never true  Transportation Needs: No Transportation Needs  . Lack of Transportation (Medical): No  . Lack of Transportation  (Non-Medical): No  Physical Activity: Not on file  Stress: Not on file  Social Connections: Not on file  Intimate Partner Violence: Not on file     No Known Allergies   Outpatient Medications Prior to Visit  Medication Sig Dispense Refill  . aspirin 81 MG chewable tablet Chew 1 tablet (81 mg total) by mouth daily. 30 tablet 6  . atorvastatin (LIPITOR) 80 MG tablet Take 1 tablet (80 mg total) by mouth every evening. 30 tablet 6  . Blood Glucose Monitoring Suppl (TRUE METRIX METER) DEVI 1 each by Does not apply route 3 (three) times daily. 1 Device 0  . carvedilol (COREG) 6.25 MG tablet TAKE 1 TABLET (6.25 MG TOTAL) BY MOUTH 2 (TWO) TIMES DAILY WITH A MEAL. 60 tablet 6  . collagenase (SANTYL) ointment Apply 1 application topically daily. 15 g 0  . dapagliflozin propanediol (FARXIGA) 10 MG TABS tablet Take 1 tablet (10 mg total) by mouth daily before breakfast. 30 tablet 6  . digoxin (LANOXIN) 0.125 MG tablet TAKE 1 TABLET (125 MCG TOTAL) BY MOUTH DAILY. (Patient taking differently: Take 0.125 mg by mouth daily.) 30 tablet 2  . Evolocumab (REPATHA SURECLICK) 710 MG/ML SOAJ Inject 1 pen into the skin every 14 (fourteen) days. 2 mL 11  . furosemide (LASIX) 40 MG tablet TAKE 1 TABLET (40 MG TOTAL) BY MOUTH DAILY. 30 tablet 3  . glipiZIDE (GLUCOTROL) 10 MG tablet Take 1 tablet (10 mg total) by mouth 2 (two) times daily before a meal. 60 tablet 6  . glucose blood (TRUE METRIX BLOOD GLUCOSE TEST) test strip 1 each by Other route 3 (three) times daily. 100 each 12  . insulin glargine (LANTUS SOLOSTAR) 100 UNIT/ML Solostar Pen Inject 20 Units into the skin daily. 3 mL 6  . Insulin Pen Needle (TRUEPLUS PEN NEEDLES) 31G X 5 MM MISC USE to inject insulin at bedtime. 100 each 6  . omeprazole (PRILOSEC) 20 MG capsule TAKE 1 CAPSULE (20 MG TOTAL) BY MOUTH DAILY. 30 capsule 1  . sacubitril-valsartan (ENTRESTO) 97-103 MG Take 1 tablet by mouth 2 (two) times daily. 180 tablet 3  . sildenafil (REVATIO) 20 MG  tablet Take 1 tablet (20 mg total) by mouth as needed (for erectile dysfunction). 10 tablet 3  . silver sulfADIAZINE (SILVADENE) 1 % cream Apply 1 application topically daily. 50 g 0  . spironolactone (ALDACTONE) 50 MG tablet Take 1 tablet (50 mg total) by mouth daily. 30 tablet 2  . TRUEplus Lancets 28G MISC 1 each by Does not apply route 3 (three) times daily. 100 each 11  . cetirizine (ZYRTEC) 10 MG tablet  Take 1 tablet (10 mg total) by mouth daily. (Patient not taking: No sig reported) 30 tablet 1  . ciprofloxacin (CIPRO) 500 MG tablet Take 1 tablet (500 mg total) by mouth 2 (two) times daily. (Patient not taking: No sig reported) 20 tablet 0  . clindamycin (CLEOCIN) 300 MG capsule Take 1 capsule (300 mg total) by mouth 3 (three) times daily. (Patient not taking: No sig reported) 21 capsule 0  . doxycycline (VIBRA-TABS) 100 MG tablet Take 1 tablet (100 mg total) by mouth 2 (two) times daily. (Patient not taking: No sig reported) 14 tablet 0  . HYDROcodone-acetaminophen (NORCO/VICODIN) 5-325 MG tablet Take 1 tablet by mouth every 4 (four) hours as needed for moderate pain. (Patient not taking: No sig reported) 12 tablet 0  . polyethylene glycol (MIRALAX / GLYCOLAX) 17 g packet Take 17 g by mouth daily as needed for mild constipation. (Patient not taking: No sig reported) 14 each 0   No facility-administered medications prior to visit.     Review of Systems  Constitutional: Negative.   HENT: Negative.   Eyes: Negative.   Respiratory: Negative.   Cardiovascular: Negative.   Gastrointestinal: Negative.   Genitourinary: Negative.   Musculoskeletal:       Pain L foot , erythema of 5th metatarsal head  Skin: Negative.   Neurological: Negative.   Hematological: Negative.   Psychiatric/Behavioral: Negative.        Objective:   Physical Exam Vitals:   12/19/20 0843  BP: 120/80  Pulse: 74  SpO2: 100%  Weight: 265 lb (120.2 kg)  Height: _0  (1.981 m)    Gen: Pleasant,  well-nourished, in no distress,  normal affect  ENT: No lesions,  mouth clear,  oropharynx clear, no postnasal drip  Neck: No JVD, no TMG, no carotid bruits  Lungs: No use of accessory muscles, no dullness to percussion, clear without rales or rhonchi  Cardiovascular: RRR, heart sounds normal, no murmur or gallops, no peripheral edema  Abdomen: soft and NT, no HSM,  BS normal  Musculoskeletal: No deformities, no cyanosis or clubbing, L foot in a boot, not examined  Neuro: alert, non focal  Skin: Warm, no lesions or rashes  No results found.  BMP Latest Ref Rng & Units 12/18/2020 07/14/2020 06/27/2020  Glucose 70 - 99 mg/dL 146(H) 145(H) 201(H)  BUN 6 - 20 mg/dL 26(H) 23(H) 21(H)  Creatinine 0.61 - 1.24 mg/dL 1.33(H) 1.06 1.25(H)  BUN/Creat Ratio 9 - 20 - - -  Sodium 135 - 145 mmol/L 141 139 139  Potassium 3.5 - 5.1 mmol/L 4.1 4.2 4.3  Chloride 98 - 111 mmol/L 105 107 103  CO2 22 - 32 mmol/L _1 Calcium 8.9 - 10.3 mg/dL 9.3 9.2 9.1   Hepatic Function Latest Ref Rng & Units 12/08/2020 09/29/2020 06/23/2020  Total Protein 6.0 - 8.5 g/dL 7.0 7.4 7.2  Albumin 4.0 - 5.0 g/dL 4.2 3.9 3.1(L)  AST 0 - 40 IU/L 16 19 11(L)  ALT 0 - 44 IU/L _2 Alk Phosphatase 44 - 121 IU/L 64 45 40  Total Bilirubin 0.0 - 1.2 mg/dL 0.4 1.0 1.0  Bilirubin, Direct 0.00 - 0.40 mg/dL 0.12 0.1 -   CBC Latest Ref Rng & Units 12/18/2020 06/27/2020 06/26/2020  WBC 4.0 - 10.5 K/uL 9.9 8.8 8.6  Hemoglobin 13.0 - 17.0 g/dL 13.8 12.7(L) 13.0  Hematocrit 39.0 - 52.0 % 42.4 39.2 40.1  Platelets 150 - 400 K/uL 222 290 271   Lab  Results  Component Value Date   HGBA1C 6.7 (H) 12/18/2020        Assessment & Plan:  I personally reviewed all images and lab data in the [facility] system as well as any outside material available during this office visit and agree with the  radiology impressions.   Controlled type 2 diabetes mellitus (HCC) Type 2 diabetes with improved control associated chronic kidney disease  which is stable now as well  No change in current medication profile patient is cleared for planned bone biopsies of the left foot on 12/20/2020  Chronic combined systolic and diastolic congestive heart failure (HCC) Combined chronic systolic and diastolic heart failure with coronary artery disease previous stenting prior history of MI  Cardiac status is stable the patient is cleared for planned anesthesia for left foot bone biopsies on 12/21/2019  Essential hypertension Hypertension controlled at this time no change in medications  Osteomyelitis of fifth toe of left foot (HCC) Chronic osteomyelitis of the fifth toe of the left foot prior amputation now with residual disease on MRI plan is for bone biopsy 12/20/2020  Mixed diabetic hyperlipidemia associated with type 2 diabetes mellitus ([place]) Continue atorvastatin as prescribed also now on Repatha  CKD (chronic kidney disease), stage III Renal function stable potassium stable electrolytes stable for planned anesthesia  Hypersomnia Hypersomnia patient would benefit from sleep study patient to follow-up with cardiology in this   Damarko was seen today for pre-op exam.  Diagnoses and all orders for this visit:  Diabetes mellitus type 2 in obese ([place]) -     POCT glucose (manual entry) -     Cancel: POCT glycosylated hemoglobin (Hb A1C)  Controlled type 2 diabetes mellitus with stage 3 chronic kidney disease, with long-term current use of insulin (HCC)  Chronic combined systolic and diastolic congestive heart failure (HCC)  Essential hypertension  Osteomyelitis of fifth toe of left foot (HCC)  Mixed diabetic hyperlipidemia associated with type 2 diabetes mellitus (HCC)  Stage 3 chronic kidney disease, unspecified whether stage 3a or 3b CKD ([place])  Hypersomnia   We discussed the importance of a Covid vaccine the patient remains reluctant as he is conflicted due to this and this information is received from multiple media  outlets

## 2020-12-19 NOTE — Patient Instructions (Addendum)
Abdominal are on the smart sets for templates for the note is that your smart set is no change in medications  You are cleared for planned surgery tomorrow on your left foot  Contact Dr. Jearld Pies and follow-up as to the status of getting your heart ultrasound performed and your vascular studies of your lower extremities done as well as a sleep study  Keep your follow-up appointments Dr. Lonni Fix  We discussed the importance of giving a Covid vaccine you said you are hesitant below the number he can call if you decide to proceed I do recommend the Pfizer vaccine or the Moderna COVID-19 Vaccine Information can be found at: [email or web address] For questions related to vaccine distribution or appointments, please email [email or web address] or call [phone].

## 2020-12-19 NOTE — Assessment & Plan Note (Signed)
Continue atorvastatin as prescribed also now on Repatha

## 2020-12-19 NOTE — Assessment & Plan Note (Signed)
Renal function stable potassium stable electrolytes stable for planned anesthesia

## 2020-12-19 NOTE — Assessment & Plan Note (Signed)
Hypersomnia patient would benefit from sleep study patient to follow-up with cardiology in this

## 2020-12-19 NOTE — Assessment & Plan Note (Signed)
Chronic osteomyelitis of the fifth toe of the left foot prior amputation now with residual disease on MRI plan is for bone biopsy 12/20/2020

## 2020-12-19 NOTE — Assessment & Plan Note (Signed)
Combined chronic systolic and diastolic heart failure with coronary artery disease previous stenting prior history of MI  Cardiac status is stable the patient is cleared for planned anesthesia for left foot bone biopsies on 12/21/2019

## 2020-12-20 ENCOUNTER — Encounter (HOSPITAL_COMMUNITY): Payer: Self-pay | Admitting: Podiatry

## 2020-12-20 ENCOUNTER — Ambulatory Visit (HOSPITAL_COMMUNITY)
Admission: RE | Admit: 2020-12-20 | Discharge: 2020-12-20 | Disposition: A | Discharge status: Home / Self Care | Payer: No Typology Code available for payment source | Attending: Podiatry | Admitting: Podiatry

## 2020-12-20 ENCOUNTER — Ambulatory Visit (HOSPITAL_COMMUNITY): Payer: No Typology Code available for payment source | Admitting: Anesthesiology

## 2020-12-20 ENCOUNTER — Encounter (HOSPITAL_COMMUNITY)
Admission: RE | Disposition: A | Discharge status: Home / Self Care | Payer: Self-pay | Source: Home / Self Care | Attending: Podiatry

## 2020-12-20 ENCOUNTER — Ambulatory Visit (HOSPITAL_COMMUNITY): Payer: No Typology Code available for payment source | Admitting: Physician Assistant

## 2020-12-20 ENCOUNTER — Other Ambulatory Visit (HOSPITAL_COMMUNITY): Payer: Self-pay | Admitting: Podiatry

## 2020-12-20 DIAGNOSIS — M86672 Other chronic osteomyelitis, left ankle and foot: Secondary | ICD-10-CM

## 2020-12-20 DIAGNOSIS — L03116 Cellulitis of left lower limb: Secondary | ICD-10-CM | POA: Diagnosis not present

## 2020-12-20 DIAGNOSIS — L97421 Non-pressure chronic ulcer of left heel and midfoot limited to breakdown of skin: Secondary | ICD-10-CM | POA: Insufficient documentation

## 2020-12-20 DIAGNOSIS — L02612 Cutaneous abscess of left foot: Secondary | ICD-10-CM | POA: Diagnosis not present

## 2020-12-20 DIAGNOSIS — E11621 Type 2 diabetes mellitus with foot ulcer: Secondary | ICD-10-CM

## 2020-12-20 HISTORY — PX: WOUND DEBRIDEMENT: SHX247

## 2020-12-20 HISTORY — PX: BONE BIOPSY: SHX375

## 2020-12-20 LAB — GLUCOSE, CAPILLARY
Glucose-Capillary: 167 mg/dL — ABNORMAL HIGH (ref 70–99)
Glucose-Capillary: 138 mg/dL — ABNORMAL HIGH (ref 70–99)

## 2020-12-20 SURGERY — BIOPSY, BONE
Anesthesia: Monitor Anesthesia Care | Laterality: Left

## 2020-12-20 MED ORDER — LIDOCAINE HCL (CARDIAC) PF 100 MG/5ML IV SOSY
PREFILLED_SYRINGE | INTRAVENOUS | Status: DC | PRN
  Administered 2020-12-20: 40 mg via INTRAVENOUS

## 2020-12-20 MED ORDER — MIDAZOLAM HCL 2 MG/2ML IJ SOLN
INTRAMUSCULAR | Status: AC
  Filled 2020-12-20: qty 2

## 2020-12-20 MED ORDER — OXYCODONE HCL 5 MG PO TABS: 5.0000 mg | ORAL_TABLET | Freq: Once | ORAL | Status: DC | PRN

## 2020-12-20 MED ORDER — CHLORHEXIDINE GLUCONATE 0.12 % MT SOLN
15.0000 mL | Freq: Once | OROMUCOSAL | Status: AC
  Administered 2020-12-20: 06:00:00 15 mL via OROMUCOSAL

## 2020-12-20 MED ORDER — ONDANSETRON HCL 4 MG/2ML IJ SOLN
INTRAMUSCULAR | Status: AC
  Filled 2020-12-20: qty 6

## 2020-12-20 MED ORDER — CLINDAMYCIN HCL 300 MG PO CAPS: 300.0000 mg | ORAL_CAPSULE | Freq: Three times a day (TID) | ORAL | 0 refills | Status: DC

## 2020-12-20 MED ORDER — SODIUM CHLORIDE 0.9 % IR SOLN
Status: DC | PRN
  Administered 2020-12-20: 1000 mL

## 2020-12-20 MED ORDER — ORAL CARE MOUTH RINSE: 15.0000 mL | Freq: Once | OROMUCOSAL | Status: AC

## 2020-12-20 MED ORDER — BUPIVACAINE HCL (PF) 0.5 % IJ SOLN
INTRAMUSCULAR | Status: AC
  Filled 2020-12-20: qty 30

## 2020-12-20 MED ORDER — ONDANSETRON HCL 4 MG/2ML IJ SOLN
INTRAMUSCULAR | Status: DC | PRN
  Administered 2020-12-20: 4 mg via INTRAVENOUS

## 2020-12-20 MED ORDER — MIDAZOLAM HCL 5 MG/5ML IJ SOLN
INTRAMUSCULAR | Status: DC | PRN
  Administered 2020-12-20: 2 mg via INTRAVENOUS

## 2020-12-20 MED ORDER — LIDOCAINE HCL (PF) 2 % IJ SOLN
INTRAMUSCULAR | Status: AC
  Filled 2020-12-20: qty 20

## 2020-12-20 MED ORDER — FENTANYL CITRATE (PF) 100 MCG/2ML IJ SOLN
INTRAMUSCULAR | Status: DC | PRN
  Administered 2020-12-20 (×2): 25 ug via INTRAVENOUS
  Administered 2020-12-20: 50 ug via INTRAVENOUS

## 2020-12-20 MED ORDER — OXYCODONE HCL 5 MG/5ML PO SOLN: 5.0000 mg | Freq: Once | ORAL | Status: DC | PRN

## 2020-12-20 MED ORDER — FENTANYL CITRATE (PF) 100 MCG/2ML IJ SOLN
INTRAMUSCULAR | Status: AC
  Filled 2020-12-20: qty 2

## 2020-12-20 MED ORDER — BUPIVACAINE HCL (PF) 0.5 % IJ SOLN
INTRAMUSCULAR | Status: DC | PRN
  Administered 2020-12-20: 10 mL

## 2020-12-20 MED ORDER — LACTATED RINGERS IV SOLN: INTRAVENOUS | Status: DC

## 2020-12-20 MED ORDER — DEXAMETHASONE SODIUM PHOSPHATE 10 MG/ML IJ SOLN
INTRAMUSCULAR | Status: AC
  Filled 2020-12-20: qty 3

## 2020-12-20 MED ORDER — PROPOFOL 1000 MG/100ML IV EMUL
INTRAVENOUS | Status: AC
  Filled 2020-12-20: qty 100

## 2020-12-20 MED ORDER — FENTANYL CITRATE (PF) 100 MCG/2ML IJ SOLN: 25.0000 ug | INTRAMUSCULAR | Status: DC | PRN

## 2020-12-20 MED ORDER — PROMETHAZINE HCL 25 MG/ML IJ SOLN: 6.2500 mg | INTRAMUSCULAR | Status: DC | PRN

## 2020-12-20 MED ORDER — PROPOFOL 10 MG/ML IV BOLUS
INTRAVENOUS | Status: AC
  Filled 2020-12-20: qty 40

## 2020-12-20 MED FILL — CLINDAMYCIN HCL 300 MG CAPS: 300 | 7 days supply | Qty: 21 | Fill #0

## 2020-12-20 SURGICAL SUPPLY — 57 items
APL PRP STRL LF DISP 70% ISPRP (MISCELLANEOUS) ×1
BLADE HEX COATED 2.75 (ELECTRODE) ×3 IMPLANT
BLADE OSCILLATING/SAGITTAL (BLADE)
BLADE SURG 15 STRL LF DISP TIS (BLADE) ×1 IMPLANT
BLADE SURG 15 STRL SS (BLADE) ×3
BLADE SW THK.38XMED LNG THN (BLADE) IMPLANT
BNDG CMPR 9X4 STRL LF SNTH (GAUZE/BANDAGES/DRESSINGS) ×1
BNDG ELASTIC 3X5.8 VLCR STR LF (GAUZE/BANDAGES/DRESSINGS) ×3 IMPLANT
BNDG ELASTIC 4X5.8 VLCR STR LF (GAUZE/BANDAGES/DRESSINGS) ×3 IMPLANT
BNDG ESMARK 4X9 LF (GAUZE/BANDAGES/DRESSINGS) ×3 IMPLANT
BNDG GAUZE ELAST 4 BULKY (GAUZE/BANDAGES/DRESSINGS) ×3 IMPLANT
CHLORAPREP W/TINT 26 (MISCELLANEOUS) ×3 IMPLANT
COVER BACK TABLE 60X90IN (DRAPES) ×3 IMPLANT
COVER WAND RF STERILE (DRAPES) IMPLANT
CUFF TOURN SGL QUICK 18X4 (TOURNIQUET CUFF) IMPLANT
DRAPE EXTREMITY T 121X128X90 (DISPOSABLE) ×3 IMPLANT
DRAPE IMP U-DRAPE 54X76 (DRAPES) ×3 IMPLANT
DRAPE U-SHAPE 47X51 STRL (DRAPES) ×3 IMPLANT
DRSG EMULSION OIL 3X3 NADH (GAUZE/BANDAGES/DRESSINGS) ×3 IMPLANT
DRSG PAD ABDOMINAL 8X10 ST (GAUZE/BANDAGES/DRESSINGS) IMPLANT
ELECT REM PT RETURN 15FT ADLT (MISCELLANEOUS) ×3 IMPLANT
GAUZE 4X4 16PLY RFD (DISPOSABLE) IMPLANT
GAUZE SPONGE 4X4 12PLY STRL (GAUZE/BANDAGES/DRESSINGS) ×3 IMPLANT
GAUZE XEROFORM 1X8 LF (GAUZE/BANDAGES/DRESSINGS) ×3 IMPLANT
GAUZE XEROFORM 5X9 LF (GAUZE/BANDAGES/DRESSINGS) IMPLANT
GLOVE BIO SURGEON STRL SZ7.5 (GLOVE) ×3 IMPLANT
GLOVE BIOGEL PI IND STRL 8 (GLOVE) ×1 IMPLANT
GLOVE BIOGEL PI INDICATOR 8 (GLOVE) ×2
GOWN STRL REUS W/ TWL LRG LVL3 (GOWN DISPOSABLE) ×1 IMPLANT
GOWN STRL REUS W/TWL LRG LVL3 (GOWN DISPOSABLE) ×3
GOWN STRL REUS W/TWL XL LVL3 (GOWN DISPOSABLE) ×6 IMPLANT
KIT BASIN OR (CUSTOM PROCEDURE TRAY) ×3 IMPLANT
MANIFOLD NEPTUNE II (INSTRUMENTS) ×3 IMPLANT
NEEDLE BIOPSY JAMSHIDI 11X6 (NEEDLE) ×6 IMPLANT
NEEDLE HYPO 25X1 1.5 SAFETY (NEEDLE) ×3 IMPLANT
NS IRRIG 1000ML POUR BTL (IV SOLUTION) IMPLANT
PACK ORTHO EXTREMITY (CUSTOM PROCEDURE TRAY) ×3 IMPLANT
PADDING CAST ABS 4INX4YD NS (CAST SUPPLIES) ×2
PADDING CAST ABS COTTON 4X4 ST (CAST SUPPLIES) ×1 IMPLANT
PENCIL SMOKE EVACUATOR (MISCELLANEOUS) IMPLANT
SET IRRIG Y TYPE TUR BLADDER L (SET/KITS/TRAYS/PACK) IMPLANT
SLEEVE SCD COMPRESS KNEE MED (MISCELLANEOUS) ×3 IMPLANT
SPONGE SURGIFOAM ABS GEL 100 (HEMOSTASIS) IMPLANT
STAPLER VISISTAT 35W (STAPLE) ×3 IMPLANT
STOCKINETTE 8 INCH (MISCELLANEOUS) ×6 IMPLANT
SUT ETHILON 3 0 PS 1 (SUTURE) IMPLANT
SUT ETHILON 4 0 PS 2 18 (SUTURE) IMPLANT
SUT MNCRL AB 3-0 PS2 18 (SUTURE) IMPLANT
SUT MNCRL AB 4-0 PS2 18 (SUTURE) IMPLANT
SUT MON AB 5-0 PS2 18 (SUTURE) IMPLANT
SUT VIC AB 3-0 FS2 27 (SUTURE) ×3 IMPLANT
SUT VIC AB 4-0 PS2 18 (SUTURE) ×3 IMPLANT
SYR BULB EAR ULCER 3OZ GRN STR (SYRINGE) ×3 IMPLANT
SYR CONTROL 10ML LL (SYRINGE) ×3 IMPLANT
TOWEL OR 17X26 10 PK STRL BLUE (TOWEL DISPOSABLE) ×3 IMPLANT
UNDERPAD 30X36 HEAVY ABSORB (UNDERPADS AND DIAPERS) ×3 IMPLANT
YANKAUER SUCT BULB TIP NO VENT (SUCTIONS) ×3 IMPLANT

## 2020-12-20 NOTE — Anesthesia Postprocedure Evaluation (Signed)
Anesthesia Post Note  Patient: [name]  Procedure(s) Performed: BONE BIOPSY X 3 (Left ) DEBRIDEMENT WOUND AND POSSIBLE CLOSURE (Left )     Patient location during evaluation: PACU Anesthesia Type: MAC Level of consciousness: awake and alert Pain management: pain level controlled Vital Signs Assessment: post-procedure vital signs reviewed and stable Respiratory status: spontaneous breathing, nonlabored ventilation and respiratory function stable Cardiovascular status: stable and blood pressure returned to baseline Anesthetic complications: no   No complications documented.  Last Vitals:  Vitals:   12/20/20 0805 12/20/20 0815  BP: 125/82 115/77  Pulse: 62 61  Resp: 12 20  Temp: 36.4 C   SpO2: 100% 96%    Last Pain:  Vitals:   12/20/20 0815  PainSc: 0-No pain                 Audry Pili

## 2020-12-20 NOTE — Interval H&P Note (Signed)
History and Physical Interval Note:  12/20/2020 7:13 AM  [name]  has presented today for surgery, with the diagnosis of ULCER LEFT FOOT,CELLULITIS AND ABCESS OF LEFT FOOT,AND OSTEOMYLETIS.  The various methods of treatment have been discussed with the patient and family. After consideration of risks, benefits and other options for treatment, the patient has consented to  Procedure(s): BONE BIOPSY X 3 (Left) DEBRIDEMENT WOUND AND POSSIBLE CLOSURE (Left) as a surgical intervention.  The patient's history has been reviewed, patient examined, no change in status, stable for surgery.  I have reviewed the patient's chart and labs.  Questions were answered to the patient's satisfaction.     Park Liter

## 2020-12-20 NOTE — Transfer of Care (Signed)
Immediate Anesthesia Transfer of Care Note  Patient: [name]  Procedure(s) Performed: Procedure(s): BONE BIOPSY X 3 (Left) DEBRIDEMENT WOUND AND POSSIBLE CLOSURE (Left)  Patient Location: PACU  Anesthesia Type:MAC  Level of Consciousness:  sedated, patient cooperative and responds to stimulation  Airway & Oxygen Therapy:Patient Spontanous Breathing and Patient connected to face mask oxgen  Post-op Assessment:  Report given to PACU RN and Post -op Vital signs reviewed and stable  Post vital signs:  Reviewed and stable  Last Vitals:  Vitals:   12/20/20 0542 12/20/20 0805  BP: 132/88 125/82  Pulse: 73 62  Resp: 18 12  Temp: (!) 36.3 C 36.4 C  SpO2: 170% 017%    Complications: No apparent anesthesia complications

## 2020-12-20 NOTE — Op Note (Signed)
°  Patient Name: [name] DOB: [birth year]  MRN: [id number]   Date of Surgery: 12/20/2020  Surgeon: Dr. Hardie Pulley, DPM Assistants: none  Pre-operative Diagnosis:  Ulcer left foot, possible osteomyelitis Post-operative Diagnosis:  Same Procedures:  1) Bone biopsy left fourth metatarsal  2) Bone biopsy left fifth metatarsal  3) Wound debridement, skin and subcutaneous tissue Pathology/Specimens: ID Type Source Tests Collected by Time Destination  A : 4TH METATASAL BONE BX Tissue PATH Soft tissue FUNGUS CULTURE WITH STAIN, AEROBIC/ANAEROBIC CULTURE (SURGICAL/DEEP WOUND) Evelina Bucy, DPM 12/20/2020 0744   B : 5TH METATARSAL BONE BX Tissue PATH Soft tissue FUNGUS CULTURE WITH STAIN, AEROBIC/ANAEROBIC CULTURE (SURGICAL/DEEP WOUND) Evelina Bucy, DPM 12/20/2020 0746    Anesthesia: MAC Hemostasis: Anatomic Estimated Blood Loss: 38m Materials: * No implants in log * Medications: 10 cc half percent Marcaine plain Complications: None  Indications for Procedure:  This is a 49y.o. male with a chronic wound to the left foot.  He had MRI that has signs concerning for osteomyelitis.  He presents today for bone biopsy.   Procedure in Detail: Patient was identified in pre-operative holding area. Formal consent was signed and the left lower extremity was marked. Patient was brought back to the operating room. Anesthesia was induced. The extremity was prepped and draped in the usual sterile fashion. Timeout was taken to confirm patient name, laterality, and procedure prior to incision.   Attention was then directed to the left foot. An incision was made overlying the fifth metatarsal.  Dissection was continued with a hemostat.  A Jamshidi needle was then used to take a plug of bone for microbiology.  Attention was then directed to the fourth metatarsal area.  An incision was made overlying the fourth metatarsal.  Dissection was continued with a hemostat.  A new Jamshidi needle was  then used to take a plug of bone for microbiology.  Both wounds were then irrigated and closed with 4-0 nylon.  Attention was then directed to the plantar lateral wound.  There was a wound measuring 2.5 x 1 predebridement.  The wound was then sharply excisionally debrided with a 15 blade.  4-0 nylon was then used to bring the wound edges close together to enhance healing.  Post debridement the wound measured 3.5 x 0.7  The foot was then dressed with xeroform, betadine, 4x4, kerlix, ACE bandage. Patient tolerated the procedure well.   Disposition: Following a period of post-operative monitoring, patient will be transferred home.

## 2020-12-20 NOTE — Discharge Instructions (Signed)
  After Surgery Instructions   1) If you are recuperating from surgery anywhere other than home, please be sure to leave us the number where you can be reached.  2) Go directly home and rest.  3) Keep the operated foot(feet) elevated six inches above the hip when sitting or lying down. This will help control swelling and pain.  4) Support the elevated foot and leg with pillows. DO NOT PLACE PILLOWS UNDER THE KNEE.  5) DO NOT REMOVE or get your bandages WET, unless you were given different instructions by your doctor to do so. This increases the risk of infection.  6) Wear your surgical shoe or surgical boot at all times when you are up on your feet.  7) A limited amount of pain and swelling may occur. The skin may take on a bruised appearance. DO NOT BE ALARMED, THIS IS NORMAL.  8) For slight pain and swelling, apply an ice pack directly over the bandages for 15 minutes only out of each hour of the day. Continue until seen in the office for your first post op visit. DO NOT APPLY ANY FORM OF HEAT TO THE AREA.  9) Have prescriptions filled immediately and take as directed.  10) Drink lots of liquids, water and juice to stay hydrated.  11) CALL IMMEDIATELY IF:  *Bleeding continues until the following day of surgery  *Pain increases and/or does not respond to medication  *Bandages or cast appears to tight  *If your bandage gets wet  *Trip, fall or stump your surgical foot  *If your temperature goes above 101  *If you have ANY questions at all  12) You are expected to be weightbearing after your surgery.   If you need to reach the nurse for any reason, please call: [place]: [phone] [place]: [phone] [place]: [phone]  

## 2020-12-21 ENCOUNTER — Encounter (HOSPITAL_COMMUNITY): Payer: Self-pay | Admitting: Podiatry

## 2020-12-21 MED FILL — LANTUS SOLOSTAR 100 UNITS/M: 100 | 30 days supply | Qty: 6 | Fill #1

## 2020-12-25 LAB — AEROBIC/ANAEROBIC CULTURE W GRAM STAIN (SURGICAL/DEEP WOUND)
Culture: NO GROWTH
Culture: NO GROWTH
Gram Stain: NONE SEEN
Gram Stain: NONE SEEN

## 2020-12-26 ENCOUNTER — Ambulatory Visit (INDEPENDENT_AMBULATORY_CARE_PROVIDER_SITE_OTHER): Payer: No Typology Code available for payment source | Admitting: Podiatry

## 2020-12-26 ENCOUNTER — Telehealth: Payer: Self-pay

## 2020-12-26 ENCOUNTER — Other Ambulatory Visit: Payer: Self-pay

## 2020-12-26 DIAGNOSIS — L03119 Cellulitis of unspecified part of limb: Secondary | ICD-10-CM | POA: Diagnosis not present

## 2020-12-26 DIAGNOSIS — L97511 Non-pressure chronic ulcer of other part of right foot limited to breakdown of skin: Secondary | ICD-10-CM | POA: Diagnosis not present

## 2020-12-26 DIAGNOSIS — M86272 Subacute osteomyelitis, left ankle and foot: Secondary | ICD-10-CM

## 2020-12-26 DIAGNOSIS — L97421 Non-pressure chronic ulcer of left heel and midfoot limited to breakdown of skin: Secondary | ICD-10-CM | POA: Diagnosis not present

## 2020-12-26 DIAGNOSIS — E08621 Diabetes mellitus due to underlying condition with foot ulcer: Secondary | ICD-10-CM | POA: Diagnosis not present

## 2020-12-26 DIAGNOSIS — L02619 Cutaneous abscess of unspecified foot: Secondary | ICD-10-CM

## 2020-12-26 NOTE — Telephone Encounter (Signed)
DOS 12/20/2020  BIOPSY BONE OPEN X 2 - 20240 BIOPSY BONE TROCAR - 51700 DEBRIDEMENT LT - 11043  NOTIFICATION/PRIOR AUTHORIZATION NUMBER CASE STATUS CASE STATUS REASON PRIMARY CARE PHYSICIAN [id number] Closed Case Was Managed And Is Now Complete - ADVANCE NOTIFY DATE/TIME ADMISSION NOTIFY DATE/TIME 12/15/2020 08:58 AM CST - COVERAGE STATUS OVERALL COVERAGE STATUS Covered/Approved 1-4 CODE DESCRIPTION COVERAGE STATUS DECISION DATE FAC [facility] Coverage determination is reflected for the facility admission and is not a guarantee of payment for ongoing services. Covered/Approved 12/21/2020 1 20240 Biopsy, bone, open; superficial (eg, ste more Covered/Approved 12/21/2020 2 20240 Biopsy, bone, open; superficial (eg, ste more Covered/Approved 12/21/2020 3 20220 Biopsy, bone, trocar, or needle; superfi more Covered/Approved 12/21/2020 4 11043 Debridement, muscle and/or fascia (inclu more Covered/Approved 12/21/2020

## 2020-12-26 NOTE — Progress Notes (Signed)
  Subjective:  Patient ID: [name], male    DOB: 02-Mar-1971,  MRN: [id number]  Chief Complaint  Patient presents with  . Routine Post Op    Pov#1 DOS 12.22.2021 LT FOOT BIOPSY OF 5TH AND 4TH METATARSAL, 4TH PROXIMAL PHALANX AS INDICATED, WOUND DEBRIDEMENT WITH POSS PARTIAL CLOSURE. Pt denies fever/nausea/vomiting/chills, no new concerns.   49 y.o. male presents for wound care. Hx confirmed with patient.  Denies interval issues since surgery. Objective:  Physical Exam: Wound Location: bilateral 5th MPJ Wound Measurement: 0.2x0.2 right, 3x0.5, more plantar than lateral Wound Base: Granular/Healthy Peri-wound: Normal Exudate: Scant/small amount Serosanguinous exudate wound without warmth, erythema, signs of acute infection  Assessment:   1. 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 Cellulitis and abscess of foot   3. Subacute osteomyelitis of left foot (HCC)   4. Diabetic ulcer of other part of right foot associated with diabetes mellitus due to underlying condition, limited to breakdown of skin [facility])    Plan:  Patient was evaluated and treated and all questions answered.  Ulcer bilateral feet -Bone cultures all negative.  Discussed this with patient.  At this point no further surgical intervention warranted. -Wounds minimally debrided today.  Dressed with Silvadene and sterile dressings -Dressed both wounds with Santyl wet-to-dry daily -We will continue to monitor for signs of continued infection -Follow-up in 10 days  No follow-ups on file.

## 2021-01-02 ENCOUNTER — Encounter: Payer: No Typology Code available for payment source | Admitting: Podiatry

## 2021-01-04 ENCOUNTER — Other Ambulatory Visit (HOSPITAL_COMMUNITY): Payer: Self-pay | Admitting: Cardiology

## 2021-01-04 MED FILL — FUROSEMIDE 40 MG TAB: 40 | 30 days supply | Qty: 30 | Fill #0

## 2021-01-04 MED FILL — SPIRONOLACTONE 50 MG TABS: 50 | 30 days supply | Qty: 30 | Fill #0

## 2021-01-04 MED FILL — DIGOXIN 0.125 MG TABLET: 125 | 30 days supply | Qty: 30 | Fill #2

## 2021-01-05 ENCOUNTER — Other Ambulatory Visit: Payer: Self-pay

## 2021-01-05 ENCOUNTER — Ambulatory Visit (INDEPENDENT_AMBULATORY_CARE_PROVIDER_SITE_OTHER): Payer: No Typology Code available for payment source | Admitting: Podiatry

## 2021-01-05 DIAGNOSIS — L97511 Non-pressure chronic ulcer of other part of right foot limited to breakdown of skin: Secondary | ICD-10-CM | POA: Diagnosis not present

## 2021-01-05 DIAGNOSIS — L97421 Non-pressure chronic ulcer of left heel and midfoot limited to breakdown of skin: Secondary | ICD-10-CM

## 2021-01-05 DIAGNOSIS — E08621 Diabetes mellitus due to underlying condition with foot ulcer: Secondary | ICD-10-CM | POA: Diagnosis not present

## 2021-01-15 MED FILL — CARVEDILOL 6.25 MG TABLET: 6.25 | 30 days supply | Qty: 60 | Fill #2

## 2021-01-15 MED FILL — ATORVASTATIN 80 MG TABLET: 80 | 30 days supply | Qty: 30 | Fill #2

## 2021-01-15 MED FILL — OMEPRAZOLE DR 20 MG CAPSULE: 20 | 30 days supply | Qty: 30 | Fill #1

## 2021-01-16 ENCOUNTER — Encounter: Payer: No Typology Code available for payment source | Admitting: Podiatry

## 2021-01-18 ENCOUNTER — Ambulatory Visit (INDEPENDENT_AMBULATORY_CARE_PROVIDER_SITE_OTHER): Payer: No Typology Code available for payment source

## 2021-01-18 ENCOUNTER — Ambulatory Visit (INDEPENDENT_AMBULATORY_CARE_PROVIDER_SITE_OTHER): Payer: No Typology Code available for payment source | Admitting: Podiatry

## 2021-01-18 ENCOUNTER — Encounter: Payer: Self-pay | Admitting: Podiatry

## 2021-01-18 ENCOUNTER — Ambulatory Visit: Payer: Self-pay | Admitting: Podiatry

## 2021-01-18 ENCOUNTER — Other Ambulatory Visit: Payer: Self-pay | Admitting: Podiatry

## 2021-01-18 ENCOUNTER — Other Ambulatory Visit: Payer: Self-pay

## 2021-01-18 ENCOUNTER — Ambulatory Visit: Payer: No Typology Code available for payment source

## 2021-01-18 DIAGNOSIS — M86272 Subacute osteomyelitis, left ankle and foot: Secondary | ICD-10-CM | POA: Diagnosis not present

## 2021-01-18 DIAGNOSIS — E08621 Diabetes mellitus due to underlying condition with foot ulcer: Secondary | ICD-10-CM

## 2021-01-18 DIAGNOSIS — L97421 Non-pressure chronic ulcer of left heel and midfoot limited to breakdown of skin: Secondary | ICD-10-CM

## 2021-01-18 DIAGNOSIS — L03119 Cellulitis of unspecified part of limb: Secondary | ICD-10-CM

## 2021-01-18 DIAGNOSIS — L97511 Non-pressure chronic ulcer of other part of right foot limited to breakdown of skin: Secondary | ICD-10-CM | POA: Diagnosis not present

## 2021-01-18 DIAGNOSIS — L02619 Cutaneous abscess of unspecified foot: Secondary | ICD-10-CM

## 2021-01-18 LAB — FUNGUS CULTURE RESULT

## 2021-01-18 LAB — FUNGAL ORGANISM REFLEX

## 2021-01-18 LAB — FUNGUS CULTURE WITH STAIN

## 2021-01-18 MED ORDER — LEVOFLOXACIN 250 MG PO TABS: 250.0000 mg | ORAL_TABLET | Freq: Every day | ORAL | 0 refills | Status: DC

## 2021-01-18 MED ORDER — CLINDAMYCIN HCL 300 MG PO CAPS: 300.0000 mg | ORAL_CAPSULE | Freq: Two times a day (BID) | ORAL | 0 refills | Status: DC

## 2021-01-18 MED FILL — REPATHA SURECLICK 140 MG/ML: 140 | 8 days supply | Qty: 2 | Fill #1

## 2021-01-18 MED FILL — levoFLOXacin 250 MG TABS: 250 | 10 days supply | Qty: 10 | Fill #0

## 2021-01-18 MED FILL — LANTUS SOLOSTAR 100 UNITS/M: 100 | 30 days supply | Qty: 6 | Fill #2

## 2021-01-18 MED FILL — CLINDAMYCIN HCL 300 MG CAPS: 300 | 10 days supply | Qty: 20 | Fill #0

## 2021-01-18 NOTE — H&P (View-Only) (Signed)
  Subjective:  Patient ID: [name], male    DOB: [birth year],  MRN: [id number]  Chief Complaint  Patient presents with  . Routine Post Op    I am doing ok and the left ankle seems to be a little puffy   50 y.o. male presents for wound care. Hx confirmed with patient. Objective:  Physical Exam: Wound Location: bilateral 5th MPJ Wound Measurement: 0.2x0.2 right, 3x0.5, more plantar than lateral Wound Base: Granular/Healthy Peri-wound: Normal Exudate: Scant/small amount Serosanguinous exudate wound with scant erythema about the wound.  Edema of the ankle Assessment:   1. 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 Cellulitis and abscess of foot   3. Subacute osteomyelitis of left foot (HCC)   4. Diabetic ulcer of other part of right foot associated with diabetes mellitus due to underlying condition, limited to breakdown of skin [facility])    Plan:  Patient was evaluated and treated and all questions answered.  Ulcer bilateral feet -Left wound minimally debrided today.  Dressed with betadine WTD -WBAT in CAM boot. Boot dispensed. -Dress both wounds with Santyl wet-to-dry daily -I did discuss due to the acute worsening I think patient would benefit from debridement.  His x-rays do not show signs of osteomyelitis today however I am concerned that the fourth toe and metatarsal could still have early osteomyelitis.  We did discuss possible fourth metatarsal head excision and fourth toe proximal phalangeal resection to prevent likelihood of worsening osteomyelitis.  We will determine intraoperatively.  Plan for procedure next week. -Generic p.o. coverage with clindamycin and Levaquin  No follow-ups on file.

## 2021-01-18 NOTE — Progress Notes (Signed)
  Subjective:  Patient ID: [name], male    DOB: 06/12/1971,  MRN: [id number]  Chief Complaint  Patient presents with  . Routine Post Op    I am doing ok and the left ankle seems to be a little puffy   49 y.o. male presents for wound care. Hx confirmed with patient. Objective:  Physical Exam: Wound Location: bilateral 5th MPJ Wound Measurement: 0.2x0.2 right, 3x0.5, more plantar than lateral Wound Base: Granular/Healthy Peri-wound: Normal Exudate: Scant/small amount Serosanguinous exudate wound with scant erythema about the wound.  Edema of the ankle Assessment:   1. 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 Cellulitis and abscess of foot   3. Subacute osteomyelitis of left foot (HCC)   4. Diabetic ulcer of other part of right foot associated with diabetes mellitus due to underlying condition, limited to breakdown of skin (HCC)    Plan:  Patient was evaluated and treated and all questions answered.  Ulcer bilateral feet -Left wound minimally debrided today.  Dressed with betadine WTD -WBAT in CAM boot. Boot dispensed. -Dress both wounds with Santyl wet-to-dry daily -I did discuss due to the acute worsening I think patient would benefit from debridement.  His x-rays do not show signs of osteomyelitis today however I am concerned that the fourth toe and metatarsal could still have early osteomyelitis.  We did discuss possible fourth metatarsal head excision and fourth toe proximal phalangeal resection to prevent likelihood of worsening osteomyelitis.  We will determine intraoperatively.  Plan for procedure next week. -Generic p.o. coverage with clindamycin and Levaquin  No follow-ups on file.  

## 2021-01-19 ENCOUNTER — Encounter: Payer: No Typology Code available for payment source | Admitting: Podiatry

## 2021-01-22 ENCOUNTER — Telehealth: Payer: Self-pay | Admitting: Critical Care Medicine

## 2021-01-22 ENCOUNTER — Other Ambulatory Visit (HOSPITAL_COMMUNITY)
Admission: RE | Admit: 2021-01-22 | Discharge: 2021-01-22 | Disposition: A | Discharge status: Home / Self Care | Payer: No Typology Code available for payment source | Source: Ambulatory Visit | Attending: Podiatry | Admitting: Podiatry

## 2021-01-22 ENCOUNTER — Encounter (HOSPITAL_BASED_OUTPATIENT_CLINIC_OR_DEPARTMENT_OTHER): Payer: Self-pay | Admitting: Podiatry

## 2021-01-22 ENCOUNTER — Telehealth: Payer: Self-pay

## 2021-01-22 DIAGNOSIS — Z20822 Contact with and (suspected) exposure to covid-19: Secondary | ICD-10-CM | POA: Diagnosis not present

## 2021-01-22 DIAGNOSIS — Z01812 Encounter for preprocedural laboratory examination: Secondary | ICD-10-CM | POA: Diagnosis present

## 2021-01-22 NOTE — Progress Notes (Signed)
DUE TO COVID-19 ONLY ONE VISITOR IS ALLOWED TO COME WITH YOU AND STAY IN THE WAITING ROOM ONLY DURING PRE OP AND PROCEDURE DAY OF SURGERY. THE 1 VISITOR  MAY VISIT WITH YOU AFTER SURGERY IN YOUR PRIVATE ROOM DURING VISITING HOURS ONLY!  YOU NEED TO HAVE A COVID 19 TEST ON_______ @_______ , THIS TEST MUST BE DONE BEFORE SURGERY,  COVID TESTING SITE [address] [place] , IT IS ON THE RIGHT GOING OUT [address] APPROXIMATELY  2 MINUTES PAST [employer] ON THE RIGHT. ONCE YOUR COVID TEST IS COMPLETED,  PLEASE BEGIN THE QUARANTINE INSTRUCTIONS AS OUTLINED IN YOUR HANDOUT.                [name]  01/22/2021   Your procedure is scheduled on: 01/24/2021    Report to [facility] Main  Entrance   Report to admitting at  1100   AM     Call this number if you have problems the morning of surgery [phone]    REMEMBER: NO  SOLID FOOD CANDY OR GUM AFTER MIDNIGHT. CLEAR LIQUIDS UNTIL     1000am       . NOTHING BY MOUTH EXCEPT CLEAR LIQUIDS UNTIL    . PLEASE FINISH ENSURE DRINK PER SURGEON ORDER  WHICH NEEDS TO BE COMPLETED AT    1000 am       CLEAR LIQUID DIET   Foods Allowed                                                                    Coffee and tea, regular and decaf                            Fruit ices (not with fruit pulp)                                      Iced Popsicles                                    Carbonated beverages, regular and diet                                    Cranberry, grape and apple juices Sports drinks like Gatorade Lightly seasoned clear broth or consume(fat free) Sugar, honey syrup ___________________________________________________________________      BRUSH YOUR TEETH MORNING OF SURGERY AND RINSE YOUR MOUTH OUT, NO CHEWING GUM CANDY OR MINTS.     Take these medicines the morning of surgery with A SIP OF WATER: coreg, lanoxin, prilosec   DO NOT TAKE ANY DIABETIC MEDICATIONS DAY OF YOUR SURGERY                                You may not have any metal on your body including hair pins and              piercings  Do  not wear jewelry, make-up, lotions, powders or perfumes, deodorant             Do not wear nail polish on your fingernails.  Do not shave  48 hours prior to surgery.              Men may shave face and neck.   Do not bring valuables to the hospital. [place].  Contacts, dentures or bridgework may not be worn into surgery.  Leave suitcase in the car. After surgery it may be brought to your room.     Patients discharged the day of surgery will not be allowed to drive home. IF YOU ARE HAVING SURGERY AND GOING HOME THE SAME DAY, YOU MUST HAVE AN ADULT TO DRIVE YOU HOME AND BE WITH YOU FOR 24 HOURS. YOU MAY GO HOME BY TAXI OR UBER OR ORTHERWISE, BUT AN ADULT MUST ACCOMPANY YOU HOME AND STAY WITH YOU FOR 24 HOURS.  Name and phone number of your driver:  Special Instructions: N/A              Please read over the following fact sheets you were given: _____________________________________________________________________  [facility] - Preparing for Surgery Before surgery, you can play an important role.  Because skin is not sterile, your skin needs to be as free of germs as possible.  You can reduce the number of germs on your skin by washing with CHG (chlorahexidine gluconate) soap before surgery.  CHG is an antiseptic cleaner which kills germs and bonds with the skin to continue killing germs even after washing. Please DO NOT use if you have an allergy to CHG or antibacterial soaps.  If your skin becomes reddened/irritated stop using the CHG and inform your nurse when you arrive at Short Stay. Do not shave (including legs and underarms) for at least 48 hours prior to the first CHG shower.  You may shave your face/neck. Please follow these instructions carefully:  1.  Shower with CHG Soap the night before surgery and the  morning of  Surgery.  2.  If you choose to wash your hair, wash your hair first as usual with your  normal  shampoo.  3.  After you shampoo, rinse your hair and body thoroughly to remove the  shampoo.                           4.  Use CHG as you would any other liquid soap.  You can apply chg directly  to the skin and wash                       Gently with a scrungie or clean washcloth.  5.  Apply the CHG Soap to your body ONLY FROM THE NECK DOWN.   Do not use on face/ open                           Wound or open sores. Avoid contact with eyes, ears mouth and genitals (private parts).                       Wash face,  Genitals (private parts) with your normal soap.             6.  Wash thoroughly,  paying special attention to the area where your surgery  will be performed.  7.  Thoroughly rinse your body with warm water from the neck down.  8.  DO NOT shower/wash with your normal soap after using and rinsing off  the CHG Soap.                9.  Pat yourself dry with a clean towel.            10.  Wear clean pajamas.            11.  Place clean sheets on your bed the night of your first shower and do not  sleep with pets. Day of Surgery : Do not apply any lotions/deodorants the morning of surgery.  Please wear clean clothes to the hospital/surgery center.  FAILURE TO FOLLOW THESE INSTRUCTIONS MAY RESULT IN THE CANCELLATION OF YOUR SURGERY PATIENT SIGNATURE_________________________________  NURSE SIGNATURE__________________________________  ________________________________________________________________________

## 2021-01-22 NOTE — Telephone Encounter (Signed)
Patient scheduled for surgery on Wednesday 01/24/2021 and would like to know if surgical clearance form was filled out by PCP and faxed back to [facility]. Patient would like a follow up call today.

## 2021-01-22 NOTE — Telephone Encounter (Signed)
DOS 01/24/2021  METATARSECTOMY LT - 28140 CONDYLECTOMY LT - 28126 DEBRIDEMENT WOUND LT - 11043  NOTIFICATION/PRIOR AUTHORIZATION NUMBER CASE STATUS CASE STATUS REASON PRIMARY CARE PHYSICIAN [id number] Closed Case Was Managed And Is Now Complete - ADVANCE NOTIFY DATE/TIME ADMISSION NOTIFY DATE/TIME 01/19/2021 10:27 AM CST - COVERAGE STATUS OVERALL COVERAGE STATUS Covered/Approved 1-3 CODE DESCRIPTION COVERAGE STATUS DECISION DATE FAC [facility] Coverage determination is reflected for the facility admission and is not a guarantee of payment for ongoing services. Covered/Approved 01/19/2021 1 11043 Debridement, muscle and/or fascia (inclu more Covered/Approved 01/19/2021 2 28126 Resection, partial or complete, phalange more Covered/Approved 01/19/2021 3 28140 Metatarsectomy Covered/Approved 01/19/2021

## 2021-01-22 NOTE — Progress Notes (Signed)
Reviewed chart for pre-op interview for surgery on 01-24-2021 for dr price. Had anesthesia, Jodell Cipro PA, review if pt candidate for surgery center.  Shanda Bumps stated due to pt ef 40% from last echo in epic 08-31-2019 and with complex health history pt needs to be done at main OR, pt would need pcp clearance and she will speak with anesthesiologist if pt would need cardiac clearance also.  Called and spoke w/ Chelle, OR scheduler for Dr Samuella Cota, informed about needed to be moved to main OR and what will need.

## 2021-01-22 NOTE — Telephone Encounter (Signed)
If no changes in his symptoms, ok for surgery from my standpoint.  Needs office followup.

## 2021-01-22 NOTE — Telephone Encounter (Signed)
Patient is calling to request his pre-surgery clearance form from Dr. Delford Field.  He stated that his surgery is scheduled for Wednesday and he needs this form asap.  Please advise and call patient to confirm at [phone]

## 2021-01-22 NOTE — Progress Notes (Addendum)
Anesthesia Review:  PCP: 01/23/2020- note for clearance- DR [name]  Cardiologist : DR Marca Ancona- note for clearance dated 01/22/2021  LOv 05/26/20  Chest x-ray : EKG :05/26/2020  Echo : 08/2019  Stress test: Cardiac Cath : 2017 and Stents  Activity level: can do a flight of stairs slowly  Sleep Study/ CPAP : no  Fasting Blood Sugar :      / Checks Blood Sugar -- times a day:   Blood Thinner/ Instructions /Last Dose: ASA / Instructions/ Last Dose :  DM type 2  12/18/2020- [id number]  Note on chart states see 12/20/20 office note 12/19/20- office visit note on chart  Checks glucose occasionally per pt  covid test on 01/22/21- negative  Pt had a cough a couple of times during preop visit.  Pt reports " like a tickle in his throat" has had for 2-3 days per pt .  Denies any other covid symptoms.  Pt states " I feel great" . Sheilah Mins made aware  NO new orders given.  CBC done 01/23/2021 routed to Dr Samuella Cota.

## 2021-01-22 NOTE — Telephone Encounter (Signed)
I contacted this patient by phone and there has not been any change in his health status since I saw him December 21 and cleared him for surgery on his foot which occurred December 22  I did indicate to him he should follow-up with his heart doctor but just as a matter of general course I was not specifically concerned about ischemia to the foot but thought perhaps the cardiologist would want to weigh in on that  From my perspective he can proceed to have his debridement procedure as scheduled  He has not seen cardiology in about 7 months and I do not specifically think is anesthesia risk is that elevated compared to the prior anesthesia event in December  The indication about vascular studies of the lower extremity was simply just as a screening procedure perhaps should be done at some point not that there was active ischemia at the time I saw him in December  Again from my perspective the patient is medically cleared for planned anesthesia and surgery January 26

## 2021-01-23 ENCOUNTER — Encounter (HOSPITAL_COMMUNITY)
Admission: RE | Admit: 2021-01-23 | Discharge: 2021-01-23 | Disposition: A | Discharge status: Home / Self Care | Payer: No Typology Code available for payment source | Source: Ambulatory Visit | Attending: Podiatry | Admitting: Podiatry

## 2021-01-23 ENCOUNTER — Other Ambulatory Visit: Payer: Self-pay

## 2021-01-23 ENCOUNTER — Encounter (HOSPITAL_COMMUNITY): Payer: Self-pay

## 2021-01-23 DIAGNOSIS — Z01812 Encounter for preprocedural laboratory examination: Secondary | ICD-10-CM | POA: Insufficient documentation

## 2021-01-23 LAB — CBC
HCT: 41.6 % (ref 39.0–52.0)
Hemoglobin: 13.5 g/dL (ref 13.0–17.0)
MCH: 26.8 pg (ref 26.0–34.0)
MCHC: 32.5 g/dL (ref 30.0–36.0)
MCV: 82.7 fL (ref 80.0–100.0)
Platelets: 362 10*3/uL (ref 150–400)
RBC: 5.03 MIL/uL (ref 4.22–5.81)
RDW: 12.8 % (ref 11.5–15.5)
WBC: 15.6 10*3/uL — ABNORMAL HIGH (ref 4.0–10.5)
nRBC: 0 % (ref 0.0–0.2)

## 2021-01-23 LAB — BASIC METABOLIC PANEL
Anion gap: 11 (ref 5–15)
BUN: 31 mg/dL — ABNORMAL HIGH (ref 6–20)
CO2: 27 mmol/L (ref 22–32)
Calcium: 9.5 mg/dL (ref 8.9–10.3)
Chloride: 100 mmol/L (ref 98–111)
Creatinine, Ser: 1.55 mg/dL — ABNORMAL HIGH (ref 0.61–1.24)
GFR, Estimated: 55 mL/min — ABNORMAL LOW (ref >60)
Glucose, Bld: 92 mg/dL (ref 70–99)
Potassium: 4.6 mmol/L (ref 3.5–5.1)
Sodium: 138 mmol/L (ref 135–145)

## 2021-01-23 LAB — GLUCOSE, CAPILLARY: Glucose-Capillary: 89 mg/dL (ref 70–99)

## 2021-01-23 LAB — SARS CORONAVIRUS 2 (TAT 6-24 HRS): SARS Coronavirus 2: NEGATIVE

## 2021-01-23 NOTE — Progress Notes (Signed)
Called pt via phone. Pt confirmed he received the G2 lower sugar drink at preop on 01/23/21.

## 2021-01-23 NOTE — Progress Notes (Signed)
AT preop appt for surgery of 01/24/2021 instructed pt to eat a healthy hs snack on 01/23/21 pm and to take 1/2 of Lantus insulin on 01/23/2021 in the pm for surgery on 01/24/2021.  Pt had already taken [place] today .  Pt aware to take no diabetic meds in the am of surgery on 01/24/21.

## 2021-01-23 NOTE — Telephone Encounter (Signed)
Scheduled to see Dr Shirlee Latch 4/1

## 2021-01-23 NOTE — Progress Notes (Signed)
BMP done 01/23/2021 routed to DR Price.

## 2021-01-24 ENCOUNTER — Ambulatory Visit (HOSPITAL_COMMUNITY): Payer: No Typology Code available for payment source

## 2021-01-24 ENCOUNTER — Ambulatory Visit (HOSPITAL_COMMUNITY)
Admission: RE | Admit: 2021-01-24 | Discharge: 2021-01-24 | Disposition: A | Discharge status: Home / Self Care | Payer: No Typology Code available for payment source | Attending: Podiatry | Admitting: Podiatry

## 2021-01-24 ENCOUNTER — Encounter (HOSPITAL_COMMUNITY)
Admission: RE | Disposition: A | Discharge status: Home / Self Care | Payer: Self-pay | Source: Home / Self Care | Attending: Podiatry

## 2021-01-24 ENCOUNTER — Encounter (HOSPITAL_COMMUNITY): Payer: Self-pay | Admitting: Podiatry

## 2021-01-24 ENCOUNTER — Ambulatory Visit (HOSPITAL_COMMUNITY): Payer: No Typology Code available for payment source | Admitting: Anesthesiology

## 2021-01-24 ENCOUNTER — Ambulatory Visit (HOSPITAL_COMMUNITY): Payer: No Typology Code available for payment source | Admitting: Physician Assistant

## 2021-01-24 DIAGNOSIS — E1169 Type 2 diabetes mellitus with other specified complication: Secondary | ICD-10-CM | POA: Diagnosis not present

## 2021-01-24 DIAGNOSIS — Z9889 Other specified postprocedural states: Secondary | ICD-10-CM

## 2021-01-24 DIAGNOSIS — L97425 Non-pressure chronic ulcer of left heel and midfoot with muscle involvement without evidence of necrosis: Secondary | ICD-10-CM | POA: Diagnosis not present

## 2021-01-24 DIAGNOSIS — L97511 Non-pressure chronic ulcer of other part of right foot limited to breakdown of skin: Secondary | ICD-10-CM | POA: Insufficient documentation

## 2021-01-24 DIAGNOSIS — E11621 Type 2 diabetes mellitus with foot ulcer: Secondary | ICD-10-CM | POA: Diagnosis present

## 2021-01-24 DIAGNOSIS — M86172 Other acute osteomyelitis, left ankle and foot: Secondary | ICD-10-CM | POA: Insufficient documentation

## 2021-01-24 DIAGNOSIS — L03116 Cellulitis of left lower limb: Secondary | ICD-10-CM | POA: Insufficient documentation

## 2021-01-24 DIAGNOSIS — L97521 Non-pressure chronic ulcer of other part of left foot limited to breakdown of skin: Secondary | ICD-10-CM | POA: Diagnosis not present

## 2021-01-24 DIAGNOSIS — L97421 Non-pressure chronic ulcer of left heel and midfoot limited to breakdown of skin: Secondary | ICD-10-CM

## 2021-01-24 DIAGNOSIS — E08621 Diabetes mellitus due to underlying condition with foot ulcer: Secondary | ICD-10-CM

## 2021-01-24 HISTORY — PX: METATARSAL HEAD EXCISION: SHX5027

## 2021-01-24 HISTORY — PX: WOUND DEBRIDEMENT: SHX247

## 2021-01-24 LAB — GLUCOSE, CAPILLARY
Glucose-Capillary: 139 mg/dL — ABNORMAL HIGH (ref 70–99)
Glucose-Capillary: 105 mg/dL — ABNORMAL HIGH (ref 70–99)

## 2021-01-24 SURGERY — DEBRIDEMENT, WOUND
Anesthesia: Monitor Anesthesia Care | Site: Toe | Laterality: Left

## 2021-01-24 MED ORDER — FENTANYL CITRATE (PF) 100 MCG/2ML IJ SOLN: 25.0000 ug | INTRAMUSCULAR | Status: DC | PRN

## 2021-01-24 MED ORDER — LIDOCAINE HCL (PF) 2 % IJ SOLN
INTRAMUSCULAR | Status: AC
  Filled 2021-01-24: qty 5

## 2021-01-24 MED ORDER — OXYCODONE HCL 5 MG PO TABS: 5.0000 mg | ORAL_TABLET | Freq: Once | ORAL | Status: DC | PRN

## 2021-01-24 MED ORDER — SODIUM CHLORIDE 0.9 % IR SOLN
Status: DC | PRN
  Administered 2021-01-24: 3000 mL

## 2021-01-24 MED ORDER — FENTANYL CITRATE (PF) 100 MCG/2ML IJ SOLN
INTRAMUSCULAR | Status: DC | PRN
  Administered 2021-01-24 (×2): 50 ug via INTRAVENOUS

## 2021-01-24 MED ORDER — CHLORHEXIDINE GLUCONATE 0.12 % MT SOLN
15.0000 mL | Freq: Once | OROMUCOSAL | Status: AC
  Administered 2021-01-24: 15 mL via OROMUCOSAL

## 2021-01-24 MED ORDER — PROPOFOL 10 MG/ML IV BOLUS
INTRAVENOUS | Status: AC
  Filled 2021-01-24: qty 20

## 2021-01-24 MED ORDER — LACTATED RINGERS IV SOLN: INTRAVENOUS | Status: DC

## 2021-01-24 MED ORDER — MIDAZOLAM HCL 2 MG/2ML IJ SOLN
INTRAMUSCULAR | Status: AC
  Filled 2021-01-24: qty 2

## 2021-01-24 MED ORDER — CEFAZOLIN SODIUM-DEXTROSE 2-4 GM/100ML-% IV SOLN
2.0000 g | INTRAVENOUS | Status: AC
  Administered 2021-01-24: 2 g via INTRAVENOUS
  Filled 2021-01-24: qty 100

## 2021-01-24 MED ORDER — PROMETHAZINE HCL 25 MG/ML IJ SOLN: 6.2500 mg | INTRAMUSCULAR | Status: DC | PRN

## 2021-01-24 MED ORDER — VANCOMYCIN HCL 1000 MG IV SOLR
INTRAVENOUS | Status: AC
  Filled 2021-01-24: qty 1000

## 2021-01-24 MED ORDER — MIDAZOLAM HCL 5 MG/5ML IJ SOLN
INTRAMUSCULAR | Status: DC | PRN
  Administered 2021-01-24: 2 mg via INTRAVENOUS

## 2021-01-24 MED ORDER — BUPIVACAINE HCL (PF) 0.5 % IJ SOLN
INTRAMUSCULAR | Status: AC
  Filled 2021-01-24: qty 30

## 2021-01-24 MED ORDER — ONDANSETRON HCL 4 MG/2ML IJ SOLN
INTRAMUSCULAR | Status: AC
  Filled 2021-01-24: qty 2

## 2021-01-24 MED ORDER — OXYCODONE HCL 5 MG/5ML PO SOLN: 5.0000 mg | Freq: Once | ORAL | Status: DC | PRN

## 2021-01-24 MED ORDER — ORAL CARE MOUTH RINSE: 15.0000 mL | Freq: Once | OROMUCOSAL | Status: AC

## 2021-01-24 MED ORDER — FENTANYL CITRATE (PF) 100 MCG/2ML IJ SOLN
INTRAMUSCULAR | Status: AC
  Filled 2021-01-24: qty 2

## 2021-01-24 MED ORDER — LIDOCAINE 2% (20 MG/ML) 5 ML SYRINGE
INTRAMUSCULAR | Status: DC | PRN
  Administered 2021-01-24: 60 mg via INTRAVENOUS

## 2021-01-24 MED ORDER — BUPIVACAINE HCL (PF) 0.5 % IJ SOLN
INTRAMUSCULAR | Status: DC | PRN
  Administered 2021-01-24: 15 mL

## 2021-01-24 SURGICAL SUPPLY — 60 items
APL PRP STRL LF DISP 70% ISPRP (MISCELLANEOUS)
BLADE HEX COATED 2.75 (ELECTRODE) ×3 IMPLANT
BLADE OSCILLATING/SAGITTAL (BLADE)
BLADE SURG 15 STRL LF DISP TIS (BLADE) ×2 IMPLANT
BLADE SURG 15 STRL SS (BLADE) ×3
BLADE SW THK.38XMED LNG THN (BLADE) IMPLANT
BNDG CMPR 9X4 STRL LF SNTH (GAUZE/BANDAGES/DRESSINGS) ×2
BNDG ELASTIC 4X5.8 VLCR STR LF (GAUZE/BANDAGES/DRESSINGS) ×3 IMPLANT
BNDG ESMARK 4X9 LF (GAUZE/BANDAGES/DRESSINGS) ×3 IMPLANT
BNDG GAUZE ELAST 4 BULKY (GAUZE/BANDAGES/DRESSINGS) ×3 IMPLANT
CHLORAPREP W/TINT 26 (MISCELLANEOUS) IMPLANT
COVER BACK TABLE 60X90IN (DRAPES) IMPLANT
COVER WAND RF STERILE (DRAPES) IMPLANT
CUFF TOURN SGL QUICK 18X4 (TOURNIQUET CUFF) IMPLANT
DRAPE EXTREMITY T 121X128X90 (DISPOSABLE) ×3 IMPLANT
DRAPE IMP U-DRAPE 54X76 (DRAPES) ×3 IMPLANT
DRAPE U-SHAPE 47X51 STRL (DRAPES) ×3 IMPLANT
DRSG ADAPTIC 3X8 NADH LF (GAUZE/BANDAGES/DRESSINGS) ×3 IMPLANT
DRSG EMULSION OIL 3X3 NADH (GAUZE/BANDAGES/DRESSINGS) ×3 IMPLANT
ELECT REM PT RETURN 15FT ADLT (MISCELLANEOUS) ×3 IMPLANT
GAUZE 4X4 16PLY RFD (DISPOSABLE) IMPLANT
GAUZE SPONGE 4X4 12PLY STRL (GAUZE/BANDAGES/DRESSINGS) ×3 IMPLANT
GAUZE XEROFORM 5X9 LF (GAUZE/BANDAGES/DRESSINGS) IMPLANT
GLOVE BIO SURGEON STRL SZ7.5 (GLOVE) ×3 IMPLANT
GLOVE SRG 8 PF TXTR STRL LF DI (GLOVE) ×2 IMPLANT
GLOVE SURG UNDER POLY LF SZ8 (GLOVE) ×3
GOWN STRL REUS W/ TWL LRG LVL3 (GOWN DISPOSABLE) ×2 IMPLANT
GOWN STRL REUS W/TWL LRG LVL3 (GOWN DISPOSABLE) ×3
GOWN STRL REUS W/TWL XL LVL3 (GOWN DISPOSABLE) ×6 IMPLANT
HANDPIECE INTERPULSE COAX TIP (DISPOSABLE) ×3
KIT BASIN OR (CUSTOM PROCEDURE TRAY) ×3 IMPLANT
MANIFOLD NEPTUNE II (INSTRUMENTS) ×3 IMPLANT
MATRIX WOUND MESHED 2X2 (Tissue) ×2 IMPLANT
MICROMATRIX 1000MG (Tissue) ×3 IMPLANT
NEEDLE HYPO 25X1 1.5 SAFETY (NEEDLE) ×3 IMPLANT
NS IRRIG 1000ML POUR BTL (IV SOLUTION) IMPLANT
PACK ORTHO EXTREMITY (CUSTOM PROCEDURE TRAY) ×3 IMPLANT
PADDING CAST ABS 4INX4YD NS (CAST SUPPLIES)
PADDING CAST ABS COTTON 4X4 ST (CAST SUPPLIES) IMPLANT
PENCIL SMOKE EVACUATOR (MISCELLANEOUS) IMPLANT
SET HNDPC FAN SPRY TIP SCT (DISPOSABLE) ×2 IMPLANT
SET IRRIG Y TYPE TUR BLADDER L (SET/KITS/TRAYS/PACK) IMPLANT
SLEEVE SCD COMPRESS KNEE MED (MISCELLANEOUS) ×3 IMPLANT
SOLUTION PARTIC MCRMTRX 1000MG (Tissue) ×2 IMPLANT
SPONGE SURGIFOAM ABS GEL 100 (HEMOSTASIS) IMPLANT
STAPLER VISISTAT 35W (STAPLE) ×3 IMPLANT
STOCKINETTE 8 INCH (MISCELLANEOUS) ×3 IMPLANT
SUT ETHILON 3 0 PS 1 (SUTURE) IMPLANT
SUT ETHILON 4 0 PS 2 18 (SUTURE) IMPLANT
SUT MNCRL AB 3-0 PS2 18 (SUTURE) IMPLANT
SUT MNCRL AB 4-0 PS2 18 (SUTURE) IMPLANT
SUT MON AB 5-0 PS2 18 (SUTURE) IMPLANT
SUT VIC AB 3-0 FS2 27 (SUTURE) ×3 IMPLANT
SUT VIC AB 4-0 PS2 18 (SUTURE) ×3 IMPLANT
SYR BULB EAR ULCER 3OZ GRN STR (SYRINGE) ×3 IMPLANT
SYR CONTROL 10ML LL (SYRINGE) ×3 IMPLANT
TOWEL OR 17X26 10 PK STRL BLUE (TOWEL DISPOSABLE) ×3 IMPLANT
UNDERPAD 30X36 HEAVY ABSORB (UNDERPADS AND DIAPERS) ×3 IMPLANT
WOUND MATRIX MESHED 2X2 (Tissue) ×1 IMPLANT
YANKAUER SUCT BULB TIP NO VENT (SUCTIONS) ×3 IMPLANT

## 2021-01-24 NOTE — Brief Op Note (Signed)
01/24/2021  2:44 PM  PATIENT:  [name]  50 y.o. male  PRE-OPERATIVE DIAGNOSIS:  Ulcer, osteomyelitis  POST-OPERATIVE DIAGNOSIS:  Ulcer, osteomyelitis  PROCEDURE:  Procedure(s): DEBRIDEMENT WOUND (Left) 4th METATARSAL HEAD EXCISION, Proximal phalanx resection (Left)  SURGEON:  Surgeon(s) and Role:    * Evelina Bucy, DPM - Primary  PHYSICIAN ASSISTANT:   ASSISTANTS: none   ANESTHESIA:   local and MAC  EBL:  5 ml   BLOOD ADMINISTERED:none  DRAINS: none   LOCAL MEDICATIONS USED:  MARCAINE    and Amount: 15 ml  SPECIMEN:   ID Type Source Tests Collected by Time Destination  1 : fourth metarsal Tissue PATH Other SURGICAL PATHOLOGY Evelina Bucy, DPM 01/24/2021 1421   2 : fourth metarsal FOR MICRO Tissue PATH Other SURGICAL PATHOLOGY Evelina Bucy, DPM 01/24/2021 1422      DISPOSITION OF SPECIMEN:  path and micro  COUNTS:  YES  TOURNIQUET:  * No tourniquets in log *  DICTATION: .Dragon Dictation  PLAN OF CARE: Discharge to home after PACU  PATIENT DISPOSITION:  PACU - hemodynamically stable.   Delay start of Pharmacological VTE agent (>24hrs) due to surgical blood loss or risk of bleeding: not applicable

## 2021-01-24 NOTE — Transfer of Care (Signed)
Immediate Anesthesia Transfer of Care Note  Patient: Monterrio Gerst  Procedure(s) Performed: DEBRIDEMENT WOUND (Left ) 4th METATARSAL HEAD EXCISION, Proximal phalanx resection (Left Toe)  Patient Location: PACU  Anesthesia Type:MAC  Level of Consciousness: awake, alert  and oriented  Airway & Oxygen Therapy: Patient Spontanous Breathing  Post-op Assessment: Report given to RN and Post -op Vital signs reviewed and stable  Post vital signs: Reviewed and stable  Last Vitals:  Vitals Value Taken Time  BP    Temp    Pulse 77 01/24/21 1443  Resp 10 01/24/21 1443  SpO2 99 % 01/24/21 1443  Vitals shown include unvalidated device data.  Last Pain:  Vitals:   01/24/21 1118  PainSc: 0-No pain         Complications: No complications documented.

## 2021-01-24 NOTE — Telephone Encounter (Signed)
Surgical procedure planned for today took place.

## 2021-01-24 NOTE — Anesthesia Postprocedure Evaluation (Signed)
Anesthesia Post Note  Patient: [name]  Procedure(s) Performed: DEBRIDEMENT WOUND (Left ) 4th METATARSAL HEAD EXCISION, Proximal phalanx resection (Left Toe)     Patient location during evaluation: PACU Anesthesia Type: MAC Level of consciousness: awake and alert Pain management: pain level controlled Vital Signs Assessment: post-procedure vital signs reviewed and stable Respiratory status: spontaneous breathing, nonlabored ventilation and respiratory function stable Cardiovascular status: stable and blood pressure returned to baseline Anesthetic complications: no   No complications documented.  Last Vitals:  Vitals:   01/24/21 1500 01/24/21 1504  BP: 120/84 (!) 137/91  Pulse: 74 77  Resp: 11 (!) 21  Temp:    SpO2: 100% 100%    Last Pain:  Vitals:   01/24/21 1443  PainSc: 0-No pain                 Audry Pili

## 2021-01-24 NOTE — Anesthesia Procedure Notes (Signed)
Date/Time: 01/24/2021 2:04 PM Performed by: Florene Route, CRNA Oxygen Delivery Method: Nasal cannula

## 2021-01-24 NOTE — Discharge Instructions (Signed)
  After Surgery Instructions   1) If you are recuperating from surgery anywhere other than home, please be sure to leave us the number where you can be reached.  2) Go directly home and rest.  3) Keep the operated foot(feet) elevated six inches above the hip when sitting or lying down. This will help control swelling and pain.  4) Support the elevated foot and leg with pillows. DO NOT PLACE PILLOWS UNDER THE KNEE.  5) DO NOT REMOVE or get your bandages WET, unless you were given different instructions by your doctor to do so. This increases the risk of infection.  6) Wear your surgical shoe or surgical boot at all times when you are up on your feet.  7) A limited amount of pain and swelling may occur. The skin may take on a bruised appearance. DO NOT BE ALARMED, THIS IS NORMAL.  8) For slight pain and swelling, apply an ice pack directly over the bandages for 15 minutes only out of each hour of the day. Continue until seen in the office for your first post op visit. DO NOT APPLY ANY FORM OF HEAT TO THE AREA.  9) Have prescriptions filled immediately and take as directed.  10) Drink lots of liquids, water and juice to stay hydrated.  11) CALL IMMEDIATELY IF:  *Bleeding continues until the following day of surgery  *Pain increases and/or does not respond to medication  *Bandages or cast appears to tight  *If your bandage gets wet  *Trip, fall or stump your surgical foot  *If your temperature goes above 101  *If you have ANY questions at all  12) You are expected to be weightbearing after your surgery.   If you need to reach the nurse for any reason, please call: [place]: [phone] [place]: [phone] [place]: [phone]  

## 2021-01-24 NOTE — Interval H&P Note (Signed)
History and Physical Interval Note:  01/24/2021 1:46 PM  [name]  has presented today for surgery, with the diagnosis of Ulcer, osteomyelitis.  The various methods of treatment have been discussed with the patient and family. After consideration of risks, benefits and other options for treatment, the patient has consented to  Procedure(s): DEBRIDEMENT WOUND, possible skin graft substitute (Left) 4th METATARSAL HEAD EXCISION, Proximal phalanx resection (Left) as a surgical intervention.  The patient's history has been reviewed, patient examined, no change in status, stable for surgery.  I have reviewed the patient's chart and labs.  Questions were answered to the patient's satisfaction.     Park Liter

## 2021-01-24 NOTE — Op Note (Incomplete)
  Patient Name: [name] DOB: Oct 26, 1971  MRN: [id number]   Date of Surgery: 01/24/2021  Surgeon: Dr. [name], DPM Assistants: ***  Pre-operative Diagnosis:  * No Diagnosis Codes entered * Post-operative Diagnosis:  * No Diagnosis Codes entered * Procedures:  1) *** Pathology/Specimens: ID Type Source Tests Collected by Time Destination  1 : fourth metarsal Tissue PATH Other SURGICAL PATHOLOGY Park Liter, DPM 01/24/2021 1421   2 : fourth metarsal FOR MICRO Tissue PATH Other SURGICAL PATHOLOGY Park Liter, DPM 01/24/2021 1422    Anesthesia: *** Hemostasis: * No tourniquets in log * Estimated Blood Loss: {None/Minimal: 21241} Materials: * No implants in log *  Integra Bilayer graft MicroMatrix powder 1g Medications: 15 ml marcaine 0.5% plain Complications: none  Indications for Procedure:  This is a 50 y.o. male with a ***   Procedure in Detail: Patient was identified in pre-operative holding area. Formal consent was signed and the *** lower extremity was marked. Patient was brought back to the operating room. Anesthesia was induced. The extremity was prepped and draped in the usual sterile fashion. Timeout was taken to confirm patient name, laterality, and procedure prior to incision.   Attention was then directed to the left plantar lateral foot. There was a wound measuring 2.5x3.5  The foot was then dressed with ***. Patient tolerated the procedure well.  Disposition: Following a period of post-operative monitoring, patient will be transferred ***.

## 2021-01-24 NOTE — Anesthesia Preprocedure Evaluation (Addendum)
Anesthesia Evaluation  Patient identified by MRN, date of birth, ID band Patient awake    Reviewed: Allergy & Precautions, NPO status , Patient's Chart, lab work & pertinent test results  History of Anesthesia Complications Negative for: history of anesthetic complications  Airway Mallampati: II  TM Distance: >3 FB Neck ROM: Full    Dental  (+) Dental Advisory Given   Pulmonary neg pulmonary ROS,    Pulmonary exam normal        Cardiovascular hypertension, Pt. on medications and Pt. on home beta blockers + CAD, + Past MI, + Cardiac Stents and +CHF  Normal cardiovascular exam   '20 TTE - EF 40-45%. LV Diastolic Doppler parameters are consistent with impaired relaxation. Trivial pericardial effusion is present.     Neuro/Psych negative neurological ROS  negative psych ROS   GI/Hepatic Neg liver ROS, GERD  Medicated and Controlled,  Endo/Other  diabetes, Type 2, Insulin Dependent Obesity   Renal/GU Renal InsufficiencyRenal disease     Musculoskeletal negative musculoskeletal ROS (+)   Abdominal (+) + obese,   Peds  Hematology negative hematology ROS (+)   Anesthesia Other Findings Covid test negative   Reproductive/Obstetrics                             Anesthesia Physical  Anesthesia Plan  ASA: III  Anesthesia Plan: MAC   Post-op Pain Management:    Induction: Intravenous  PONV Risk Score and Plan: 1 and Treatment may vary due to age or medical condition and Midazolam  Airway Management Planned: Natural Airway and Simple Face Mask  Additional Equipment: None  Intra-op Plan:   Post-operative Plan:   Informed Consent: I have reviewed the patients History and Physical, chart, labs and discussed the procedure including the risks, benefits and alternatives for the proposed anesthesia with the patient or authorized representative who has indicated his/her understanding and  acceptance.       Plan Discussed with: CRNA and Anesthesiologist  Anesthesia Plan Comments:        Anesthesia Quick Evaluation

## 2021-01-24 NOTE — H&P (Signed)
Anesthesia H&P Update: History and Physical Exam reviewed; patient is OK for planned anesthetic and procedure. ? ?

## 2021-01-25 ENCOUNTER — Encounter (HOSPITAL_COMMUNITY): Payer: Self-pay | Admitting: Podiatry

## 2021-01-25 MED ORDER — VANCOMYCIN HCL 1000 MG IV SOLR
INTRAVENOUS | Status: AC | PRN
  Administered 2021-01-24: 1000 mg

## 2021-01-25 NOTE — OR Nursing (Signed)
Histology from [facility] Requested a separate order to be put in for Left toe.  Order added for microbiology per Dr Prices order for aerobic, anaerobic and fungal evaluation.

## 2021-01-26 LAB — SURGICAL PATHOLOGY

## 2021-01-29 LAB — ANAEROBIC CULTURE

## 2021-01-29 NOTE — Progress Notes (Signed)
  Subjective:  Patient ID: [name], male    DOB: 1971/07/06,  MRN: [id number]  No chief complaint on file.  50 y.o. male presents for wound care. Hx confirmed with patient.  Denies complaints or concerns Objective:  Physical Exam: Wound Location: bilateral 5th MPJ Wound Measurement: 0.2x0.2 right, 3x1.5, more plantar than lateral Wound Base: Granular/Healthy Peri-wound: Normal Exudate: Scant/small amount Serosanguinous exudate wound without warmth, erythema, signs of acute infection  Assessment:   1. 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 Diabetic ulcer of other part of right foot associated with diabetes mellitus due to underlying condition, limited to breakdown of skin [facility])    Plan:  Patient was evaluated and treated and all questions answered.  Ulcer bilateral feet -Left foot slightly worsened today -Wounds minimally debrided today.  Dressed with Silvadene and sterile dressings -Dress both wounds with Santyl wet-to-dry daily  No follow-ups on file.

## 2021-01-30 ENCOUNTER — Other Ambulatory Visit: Payer: Self-pay | Admitting: Podiatry

## 2021-01-30 ENCOUNTER — Ambulatory Visit (INDEPENDENT_AMBULATORY_CARE_PROVIDER_SITE_OTHER): Payer: No Typology Code available for payment source | Admitting: Podiatry

## 2021-01-30 ENCOUNTER — Other Ambulatory Visit: Payer: Self-pay

## 2021-01-30 DIAGNOSIS — Z9889 Other specified postprocedural states: Secondary | ICD-10-CM

## 2021-01-30 DIAGNOSIS — E08621 Diabetes mellitus due to underlying condition with foot ulcer: Secondary | ICD-10-CM

## 2021-01-30 DIAGNOSIS — L97421 Non-pressure chronic ulcer of left heel and midfoot limited to breakdown of skin: Secondary | ICD-10-CM

## 2021-01-30 MED ORDER — AMOXICILLIN-POT CLAVULANATE 875-125 MG PO TABS: 1.0000 | ORAL_TABLET | Freq: Two times a day (BID) | ORAL | 0 refills | Status: DC

## 2021-01-30 MED FILL — REPATHA SURECLICK 140 MG/ML: 140 | 8 days supply | Qty: 2 | Fill #1

## 2021-01-30 MED FILL — LANTUS SOLOSTAR 100 UNITS/M: 100 | 30 days supply | Qty: 6 | Fill #2

## 2021-01-30 MED FILL — AMOX-CLAV 875-125 MG TABLET: 875-125 | 10 days supply | Qty: 20 | Fill #0

## 2021-02-01 ENCOUNTER — Other Ambulatory Visit (HOSPITAL_COMMUNITY): Payer: Self-pay | Admitting: Cardiology

## 2021-02-01 MED FILL — FUROSEMIDE 40 MG TAB: 40 | 30 days supply | Qty: 30 | Fill #0

## 2021-02-01 MED FILL — SPIRONOLACTONE 50 MG TABS: 50 | 30 days supply | Qty: 30 | Fill #0

## 2021-02-01 MED FILL — DIGOXIN 0.125 MG TABLET: 125 | 30 days supply | Qty: 30 | Fill #3

## 2021-02-09 ENCOUNTER — Other Ambulatory Visit: Payer: Self-pay

## 2021-02-09 ENCOUNTER — Ambulatory Visit (INDEPENDENT_AMBULATORY_CARE_PROVIDER_SITE_OTHER): Payer: No Typology Code available for payment source | Admitting: Podiatry

## 2021-02-09 DIAGNOSIS — E08621 Diabetes mellitus due to underlying condition with foot ulcer: Secondary | ICD-10-CM | POA: Diagnosis not present

## 2021-02-09 DIAGNOSIS — E11621 Type 2 diabetes mellitus with foot ulcer: Secondary | ICD-10-CM

## 2021-02-09 DIAGNOSIS — L97425 Non-pressure chronic ulcer of left heel and midfoot with muscle involvement without evidence of necrosis: Secondary | ICD-10-CM | POA: Diagnosis not present

## 2021-02-09 DIAGNOSIS — L97511 Non-pressure chronic ulcer of other part of right foot limited to breakdown of skin: Secondary | ICD-10-CM

## 2021-02-09 NOTE — Progress Notes (Signed)
  Subjective:  Patient ID: [name], male    DOB: [birth year],  MRN: [id number]  No chief complaint on file.  50 y.o. male presents for wound care. Hx confirmed with patient.  Has not changed the dressing finishes antibiotics denies new issues with the foot. Objective:  Physical Exam: Wound Location: right 5th MPJ, left 4th MPJ Wound Measurement: 1x0.5 Right, UTA left Wound Base: Granular/Healthy Peri-wound: Normal Exudate: Scant/small amount Serosanguinous exudate  Assessment:   1. Diabetic ulcer of left midfoot associated with type 2 diabetes mellitus, with muscle involvement without evidence of necrosis (HCC)   2. Diabetic ulcer of other part of right foot associated with diabetes mellitus due to underlying condition, limited to breakdown of skin [facility])    Plan:  Patient was evaluated and treated and all questions answered.  Ulcer bilateral feet -Right foot wound debrided as below. -Graft left intact to the wound on the left. Plan to remove next week. -No further abx indicated. -Consider repeat graft application in the office.  -Lubricating jelly and dry sterile dressing placed left foot.  Silvadene Band-Aid applied right.  Procedure: Selective Debridement of Wound Rationale: Removal of devitalized tissue from the wound to promote healing.  Pre-Debridement Wound Measurements: 1x0.5  Post-Debridement Wound Measurements: same as pre-debridement. Type of Debridement: sharp selective Instrumentation: dermal curette, tissue nipper Tissue Removed: Devitalized soft-tissue Dressing: Dry, sterile, compression dressing. Disposition: Patient tolerated procedure well.     Return in about 1 week (around 02/16/2021).

## 2021-02-16 ENCOUNTER — Ambulatory Visit (INDEPENDENT_AMBULATORY_CARE_PROVIDER_SITE_OTHER): Payer: No Typology Code available for payment source | Admitting: Podiatry

## 2021-02-16 ENCOUNTER — Other Ambulatory Visit: Payer: Self-pay

## 2021-02-16 ENCOUNTER — Encounter: Payer: No Typology Code available for payment source | Admitting: Podiatry

## 2021-02-16 DIAGNOSIS — L97425 Non-pressure chronic ulcer of left heel and midfoot with muscle involvement without evidence of necrosis: Secondary | ICD-10-CM | POA: Diagnosis not present

## 2021-02-16 DIAGNOSIS — E08621 Diabetes mellitus due to underlying condition with foot ulcer: Secondary | ICD-10-CM | POA: Diagnosis not present

## 2021-02-16 DIAGNOSIS — E11621 Type 2 diabetes mellitus with foot ulcer: Secondary | ICD-10-CM | POA: Diagnosis not present

## 2021-02-16 DIAGNOSIS — L97511 Non-pressure chronic ulcer of other part of right foot limited to breakdown of skin: Secondary | ICD-10-CM | POA: Diagnosis not present

## 2021-02-19 ENCOUNTER — Other Ambulatory Visit (HOSPITAL_COMMUNITY): Payer: Self-pay

## 2021-02-19 ENCOUNTER — Telehealth (HOSPITAL_COMMUNITY): Payer: Self-pay | Admitting: Pharmacy Technician

## 2021-02-19 ENCOUNTER — Other Ambulatory Visit (HOSPITAL_COMMUNITY): Payer: Self-pay | Admitting: Cardiology

## 2021-02-19 MED ORDER — ENTRESTO 97-103 MG PO TABS: 1.0000 | ORAL_TABLET | Freq: Two times a day (BID) | ORAL | 3 refills | Status: DC

## 2021-02-19 NOTE — Telephone Encounter (Addendum)
Advanced Heart Failure Patient Advocate Encounter  Prior Authorization for [name] has been approved.    PA# [id number] Effective dates: 02/19/21 through 02/19/22  Patients co-pay is $250 (90 days), with the co-pay card, 90 days will be $10. Had the co-pay card information sent to the patient's email address on file. Sent [name] (CMA) a message requesting that a 90 day RX be sent to the patient's pharmacy with the copay card information attached. Spoke with patient.  Copay Card BIN [id number] [facility] [id number] ID [id number]  Patient is currently taking [name] and receives this medication via [place] assistance. That eligibility will end in September. At that point, I would suggest switching him to [place]. His current insurance prefers [place] over [place].  Archer Asa, CPhT

## 2021-02-19 NOTE — Telephone Encounter (Signed)
It is time to renew the patient's [name] assistance through [employer]. Upon further review, his insurance is requiring a PA for the medication. Will submit PA and check the patient's co-pay. Will assess assistance after the PA has been approved.   Patient Advocate Encounter   Received notification from [employer] that prior authorization for [name] is required.   PA submitted on CoverMyMeds Key [id number] Status is pending   Will continue to follow.

## 2021-02-19 NOTE — Telephone Encounter (Signed)
Meds ordered this encounter  Medications  . sacubitril-valsartan (ENTRESTO) 97-103 MG    Sig: Take 1 tablet by mouth 2 (two) times daily.    Dispense:  180 tablet    Refill:  3    Rx Card info BIN [id number] [facility] [id number] ID [id number]

## 2021-02-21 ENCOUNTER — Other Ambulatory Visit (HOSPITAL_COMMUNITY): Payer: Self-pay | Admitting: Cardiology

## 2021-02-21 MED FILL — OMEPRAZOLE DR 20 MG CAPSULE: 20 | 30 days supply | Qty: 30 | Fill #0

## 2021-02-21 MED FILL — REPATHA SURECLICK 140 MG/ML: 140 | 28 days supply | Qty: 2 | Fill #2

## 2021-02-21 MED FILL — CARVEDILOL 6.25 MG TABLET: 6.25 | 30 days supply | Qty: 60 | Fill #3

## 2021-02-21 MED FILL — glipiZIDE 10 MG TABS: 10 | 90 days supply | Qty: 180 | Fill #1

## 2021-02-26 MED FILL — LANTUS SOLOSTAR 100 UNITS/M: 100 | 15 days supply | Qty: 3 | Fill #3

## 2021-02-26 NOTE — Progress Notes (Signed)
  Subjective:  Patient ID: [name], male    DOB: [birth year],  MRN: [id number]  Chief Complaint  Patient presents with  . Wound Check    Denies fever/chills/nausea/vomiting. No new concerns.   50 y.o. male presents for wound care. Hx confirmed with patient.  Requesting handicap sticker today Objective:  Physical Exam: Wound Location: right 5th MPJ, left 4th MPJ Wound Measurement: 1x0.5 Right, UTA left Wound Base: Granular/Healthy Peri-wound: Normal Exudate: Scant/small amount Serosanguinous exudate  Assessment:   1. Diabetic ulcer of left midfoot associated with type 2 diabetes mellitus, with muscle involvement without evidence of necrosis (HCC)   2. Diabetic ulcer of other part of right foot associated with diabetes mellitus due to underlying condition, limited to breakdown of skin [facility])    Plan:  Patient was evaluated and treated and all questions answered.  Ulcer bilateral feet -Graft left intact to the wound on the left.   -No further abx indicated. -Consider repeat graft application in the office.  -Lubricating jelly and dry sterile dressing placed left foot.  Silvadene Band-Aid applied right.   No follow-ups on file.

## 2021-02-26 NOTE — Progress Notes (Signed)
  Subjective:  Patient ID: [name], male    DOB: 1971/11/18,  MRN: [id number]  Chief Complaint  Patient presents with  . Routine Post Op    POV#1 -pt denies N/V/F/Ch -pt states," a little pain not bad, I can walk on it; 1/10." -dressing dry, clean and intact -pt completed last abx Sat Tx: sx shoe and elevation   . Diabetes    FBS: dont check A1C: 6.9   50 y.o. male presents for wound care. Hx confirmed with patient.   Objective:  Physical Exam: Wound Location: bilateral 5th MPJ Wound Measurement: UTA left Wound Base: Granular/Healthy Peri-wound: Normal Exudate: Scant/small amount Serosanguinous exudate wound without warmth, erythema, signs of acute infection  Assessment:   1. 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  Ulcer bilateral feet -Graft intact left. -Dressed with lubricating jelly and sterile dressing -Continue surgical shoe. -F/u in 1 week for recheck   No follow-ups on file.

## 2021-02-27 ENCOUNTER — Other Ambulatory Visit (HOSPITAL_COMMUNITY): Payer: Self-pay | Admitting: Cardiology

## 2021-02-27 ENCOUNTER — Other Ambulatory Visit: Payer: Self-pay | Admitting: Pharmacist

## 2021-02-27 DIAGNOSIS — E1169 Type 2 diabetes mellitus with other specified complication: Secondary | ICD-10-CM

## 2021-02-27 MED ORDER — LANTUS SOLOSTAR 100 UNIT/ML ~~LOC~~ SOPN: 20.0000 [IU] | PEN_INJECTOR | Freq: Every day | SUBCUTANEOUS | 0 refills | Status: DC

## 2021-02-27 MED FILL — DIGOXIN 0.125 MG TABLET: 125 | 30 days supply | Qty: 30 | Fill #0

## 2021-02-27 MED FILL — SPIRONOLACTONE 50 MG TABS: 50 | 30 days supply | Qty: 30 | Fill #0

## 2021-02-27 NOTE — Op Note (Signed)
  Patient Name: [name] DOB: 07-06-1971  MRN: [id number]   Date of Surgery: 01/24/2021  Surgeon: Dr. 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 Item Serial No. Manufacturer Lot No. LRB No. Used Action  WOUND MATRIX MESHED 2X2 - [id number] Tissue WOUND MATRIX MESHED 2X2  [employer] [id number] Left 1 Implanted  MICROMATRIX 1000MG - [id number] Tissue MICROMATRIX 1000MG [id number] Left 1 Implanted   Medications: 1g Vancomycin topical Complications: none  Indications for Procedure:  This is a 50 y.o. male with a chronic wound toe the lateral aspect of the    Procedure in Detail: Patient was identified in pre-operative holding area. Formal consent was signed and the left lower extremity was marked. Patient was brought back to the operating room. Anesthesia was induced. The extremity was prepped and draped in the usual sterile fashion. Timeout was taken to confirm patient name, laterality, and procedure prior to incision.   Attention was then directed to the left foot. There was a wound measuring 3*0.5 with exposed bone.  The wound was debrided with a 15 blade and pickup.  The wound was then further irrigated with pulse lavage.  A sagittal saw was then used to transect a portion of the  remaining fourth metatarsal and fourth proximal phalanx.  This was split and sent for path and micro.  At this point the wound looked healthy and viable without any further immediately exposed bone.  The wound was then irrigated again.  Vancomycin and micro matrix powder were applied topically to the wound retention sutures were placed.  At this point the wound measured 2 x 0.5.  An Integra wound matrix was applied topically to the wound.  A sterile dressing of Adaptic 4 x 4 Kerlix and Ace bandage was applied.  Patient tolerated procedure well.  Disposition: Following a period of post-operative monitoring, patient will be transferred home.

## 2021-03-02 ENCOUNTER — Other Ambulatory Visit: Payer: Self-pay

## 2021-03-02 ENCOUNTER — Ambulatory Visit (INDEPENDENT_AMBULATORY_CARE_PROVIDER_SITE_OTHER): Payer: No Typology Code available for payment source | Admitting: Podiatry

## 2021-03-02 DIAGNOSIS — E11621 Type 2 diabetes mellitus with foot ulcer: Secondary | ICD-10-CM

## 2021-03-02 DIAGNOSIS — L97425 Non-pressure chronic ulcer of left heel and midfoot with muscle involvement without evidence of necrosis: Secondary | ICD-10-CM | POA: Diagnosis not present

## 2021-03-02 MED FILL — FUROSEMIDE 40 MG TAB: 40 | 30 days supply | Qty: 30 | Fill #1

## 2021-03-02 NOTE — Progress Notes (Signed)
  Subjective:  Patient ID: [name], male    DOB: November 04, 1971,  MRN: [id number]  Chief Complaint  Patient presents with  . Wound Check    F/U Lt wound check -pt denies N/V/F/Ch -FBS: 120 x last wk A1C: 6.8 -pt states," looks a lot better." -same with redness -no swelling, little drainage -no odor Tx: santly and dry dressing    50 y.o. male presents for wound care. Hx confirmed with patient.  Objective:  Physical Exam: Wound Location: right 5th MPJ, left 4th MPJ Wound Measurement: 1.5x1.5 R, 2.0x1.5 L Wound Base: Granular/Healthy Peri-wound: Normal Exudate: Scant/small amount Serosanguinous exudate  Assessment:   1. Diabetic ulcer of left midfoot associated with type 2 diabetes mellitus, with muscle involvement without evidence of necrosis [facility])    Plan:  Patient was evaluated and treated and all questions answered.  Ulcer bilateral feet -Both wounds debrided -Apligraf applied Lot [id number] applied to the left wound, covered with mepilex, 4x4, kerlix, ACE bandage. -We did discuss possibility of right 5th met head excision at a later date to prevent further ulceration.  Procedure: Selective Debridement of Wound Rationale: Removal of devitalized tissue from the wound to promote healing.  Pre-Debridement Wound Measurements: 1.5x1.5 cm, 2x1.5 Post-Debridement Wound Measurements: same as pre-debridement. Type of Debridement: sharp selective Instrumentation: dermal curette, tissue nipper Tissue Removed: Devitalized soft-tissue Dressing: Dry, sterile, compression dressing. Disposition: Patient tolerated procedure well.   Return in about 1 week (around 03/09/2021) for Wound Graft Application.

## 2021-03-09 ENCOUNTER — Other Ambulatory Visit: Payer: Self-pay

## 2021-03-09 ENCOUNTER — Ambulatory Visit (INDEPENDENT_AMBULATORY_CARE_PROVIDER_SITE_OTHER): Payer: No Typology Code available for payment source | Admitting: Podiatry

## 2021-03-09 DIAGNOSIS — L97523 Non-pressure chronic ulcer of other part of left foot with necrosis of muscle: Secondary | ICD-10-CM

## 2021-03-09 NOTE — Progress Notes (Signed)
  Subjective:  Patient ID: [name], male    DOB: 21-Mar-1971,  MRN: [id number]  Chief Complaint  Patient presents with  . Routine Post Op    POV#4 Pt states no concerns, denies fever/chills/nausea/vomiting.   50 y.o. male presents for wound care. Hx confirmed with patient. Did not change dressing as directed. Objective:  Physical Exam: Wound Location: right 5th MPJ, left 4th MPJ Wound Measurement: 1.2x2.1 Left, 1.5x0.8 Right Wound Base: Granular/Healthy Peri-wound: Normal Exudate: Scant/small amount Serosanguinous exudate  Assessment:   1. 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  Ulcer bilateral feet -Both wounds debrided -Apligraf [id number] exp 03/10/21 applied to the left foot wound, covered with mepitel, 4x4, kerlix, Coban. -Patient to leave intact x1 week.  No follow-ups on file.

## 2021-03-15 ENCOUNTER — Other Ambulatory Visit: Payer: Self-pay

## 2021-03-15 ENCOUNTER — Ambulatory Visit (INDEPENDENT_AMBULATORY_CARE_PROVIDER_SITE_OTHER): Payer: No Typology Code available for payment source | Admitting: Podiatry

## 2021-03-15 DIAGNOSIS — L97425 Non-pressure chronic ulcer of left heel and midfoot with muscle involvement without evidence of necrosis: Secondary | ICD-10-CM

## 2021-03-15 DIAGNOSIS — E11621 Type 2 diabetes mellitus with foot ulcer: Secondary | ICD-10-CM | POA: Diagnosis not present

## 2021-03-15 NOTE — Progress Notes (Signed)
  Subjective:  Patient ID: [name], male    DOB: 1971/09/17,  MRN: [id number]  Chief Complaint  Patient presents with  . Routine Post Op    POV #5 DOS 01/24/2021 LT FOOT WOUND DEBRIDEMENT & IRRIGATION W/POSSIBLE PARTIAL 4TH METATARSAL & PROXIMAL PHALANX RESECTION IF INDECATED   50 y.o. male presents for wound care. Hx confirmed with patient. States dressing came off last night. Objective:  Physical Exam: Wound Location: right 5th MPJ, left 4th MPJ Wound Measurement: 2.1x1.1  Left, 1.5x1.1 Right Wound Base: Hypergranular left, granular right Peri-wound: Normal Exudate: Mod amount Serosanguinous exudate  Assessment:   1. Diabetic ulcer of left midfoot associated with type 2 diabetes mellitus, with muscle involvement without evidence of necrosis [facility])    Plan:  Patient was evaluated and treated and all questions answered.  Ulcer bilateral feet -Both wounds debrided. Left appears improved with hypergranular wound. -Apligraf Lot [id number] Exp 03/23/21 applied to left foot wound. Covered with mepitel, 4x4, kerlix, Coban. Patient to leave intact 1 week.  No follow-ups on file.

## 2021-03-19 ENCOUNTER — Ambulatory Visit: Payer: No Typology Code available for payment source | Admitting: Critical Care Medicine

## 2021-03-23 ENCOUNTER — Ambulatory Visit (INDEPENDENT_AMBULATORY_CARE_PROVIDER_SITE_OTHER): Payer: No Typology Code available for payment source

## 2021-03-23 ENCOUNTER — Other Ambulatory Visit: Payer: Self-pay | Admitting: Podiatry

## 2021-03-23 ENCOUNTER — Ambulatory Visit: Payer: Self-pay | Admitting: Podiatry

## 2021-03-23 ENCOUNTER — Other Ambulatory Visit: Payer: Self-pay

## 2021-03-23 ENCOUNTER — Ambulatory Visit (INDEPENDENT_AMBULATORY_CARE_PROVIDER_SITE_OTHER): Payer: No Typology Code available for payment source | Admitting: Podiatry

## 2021-03-23 DIAGNOSIS — E11621 Type 2 diabetes mellitus with foot ulcer: Secondary | ICD-10-CM | POA: Diagnosis not present

## 2021-03-23 DIAGNOSIS — E08621 Diabetes mellitus due to underlying condition with foot ulcer: Secondary | ICD-10-CM

## 2021-03-23 DIAGNOSIS — L97425 Non-pressure chronic ulcer of left heel and midfoot with muscle involvement without evidence of necrosis: Secondary | ICD-10-CM | POA: Diagnosis not present

## 2021-03-23 DIAGNOSIS — L97511 Non-pressure chronic ulcer of other part of right foot limited to breakdown of skin: Secondary | ICD-10-CM | POA: Diagnosis not present

## 2021-03-23 MED FILL — ATORVASTATIN 80 MG TABLET: 80 | 30 days supply | Qty: 30 | Fill #3

## 2021-03-23 MED FILL — OMEPRAZOLE DR 20 MG CAPSULE: 20 | 30 days supply | Qty: 30 | Fill #1

## 2021-03-24 NOTE — Progress Notes (Signed)
Subjective:    Patient ID: [name], male    DOB: Dec 10, 1971, 50 y.o.   MRN: [id number]  50 y.o.M PCP [facility]  Needs preop clearance hx of CHF, CAD, T2DM, HTN, obesity Hx Left foot DM ulcer, needs bone biopsy of Left foot  12/22 per Dr March Rummage  This patient is seen today for preop evaluation for planned left foot bone biopsy of the residual head of the left fifth metatarsal.  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  The patient currently has no complaints.  He has a history of ischemic cardiomyopathy ejection fraction 45% noted in 2020.  He has had previous myocardial infarction with combined systolic diastolic heart failure hypertension type 2 diabetes  Patient's had cardiac stents in place and is on therapy through the advanced heart failure clinic and has been stable in this regard.  He has no active chest pain or shortness of breath.  His preop labs done yesterday are stable.  From a diabetes perspective his A1c is 6.7 as of yesterday and blood sugar on arrival 140.  Patient had a diabetic ulcer of the left midfoot which had been treated now has issues with the left forefoot in the fifth metatarsal.  He was to have a peripheral vascular artery study but this is yet to be scheduled.  He also was requested to have a home study but did not have insurance now he does have insurance and is interested in getting a sleep study  On arrival blood pressure is 120/80  03/26/21 Patient is seen today in follow-up and has plans for additional surgery now on the right fifth toe as he has a punctured callus with a deep fistula.  Patient needs surgical clearance.  Blood sugars have been good in the 130 range.  He has no chest pain or shortness of breath and has been compliant with all of his cardiac medications.  Patient does have neuropathy in the feet.   Surgery is planned on April 1.  Patient prior left diabetic ulcer which is resolved itself.  Continues to decline COVID vaccination.  Patient is yet to have an eye exam.  Patient is in need of a pneumonia vaccine and he agrees to this.  Patient needs colonoscopy and agrees to receive the Cologuard kit. Note at the last lab check his serum creatinine was up to 1.55 in January there is another check due this week.   Past Medical History:  Diagnosis Date  . Acute systolic CHF (congestive heart failure) ([place]) 10/28/2016  . CAD (coronary artery disease)    NSTEMI 10/2011: LHC 11/04/11: pLAD 90%, mLAD 60-70%, small D2 sub totally occluded at ostium, small OM1 90% ostial, 90% mid, mOM2 30%, oPL 80%, RCA 30%, dPDA 80%, EF 20% with ant AK.  PCI:  Promus DES to pLAD.  [name] Cellulitis of left foot 06/22/2020  . Chronic systolic heart failure ([place])   . Diabetic ulcer of left midfoot associated with type 2 diabetes mellitus, with muscle involvement without evidence of necrosis ([place])   . DM2 (diabetes mellitus, type 2) ([place])    type 2  . GERD (gastroesophageal reflux disease)   . HTN (hypertension)   . Hyperlipidemia   . Ischemic cardiomyopathy    Echo 11/03/11: mod LVH, mild focal basal septal hypertrophy, EF 15%, grade 2 diast dysfxn, mild MR, mild to mod LAE, mild RVE,  mild to mod reduced RVSF.  EF 3/5 50% by echo  . Myocardial infarction ([place])   . Neuromuscular disorder (HCC)    neuropathy feet  . Obesity   . Ulcer of right foot ([place]) 06/22/2020     Family History  Problem Relation Age of Onset  . Heart disease Father   . Heart disease Mother        MOTHER HAD CABG     Social History   Socioeconomic History  . Marital status: Single    Spouse name: Not on file  . Number of children: Not on file  . Years of education: Not on file  . Highest education level: Not on file  Occupational History  . Not on file  Tobacco Use  . Smoking status: Never Smoker  . Smokeless tobacco: Never Used   Vaping Use  . Vaping Use: Never used  Substance and Sexual Activity  . Alcohol use: Not Currently    Alcohol/week: 1.0 standard drink    Types: 1 Shots of liquor per week  . Drug use: No  . Sexual activity: Yes    Birth control/protection: None  Other Topics Concern  . Not on file  Social History Narrative  . Not on file   Social Determinants of Health   Financial Resource Strain: Low Risk   . Difficulty of Paying Living Expenses: Not very hard  Food Insecurity: No Food Insecurity  . Worried About Charity fundraiser in the Last Year: Never true  . Ran Out of Food in the Last Year: Never true  Transportation Needs: No Transportation Needs  . Lack of Transportation (Medical): No  . Lack of Transportation (Non-Medical): No  Physical Activity: Not on file  Stress: Not on file  Social Connections: Not on file  Intimate Partner Violence: Not on file     No Known Allergies   Outpatient Medications Prior to Visit  Medication Sig Dispense Refill  . aspirin 81 MG chewable tablet Chew 1 tablet (81 mg total) by mouth daily. 30 tablet 6  . Blood Glucose Monitoring Suppl (TRUE METRIX METER) DEVI 1 each by Does not apply route 3 (three) times daily. 1 Device 0  . sacubitril-valsartan (ENTRESTO) 97-103 MG Take 1 tablet by mouth 2 (two) times daily. 180 tablet 3  . sildenafil (REVATIO) 20 MG tablet Take 1 tablet (20 mg total) by mouth as needed (for erectile dysfunction). (Patient not taking: Reported on 03/26/2021) 10 tablet 3  . TRUEplus Lancets 28G MISC 1 each by Does not apply route 3 (three) times daily. 100 each 11  . atorvastatin (LIPITOR) 80 MG tablet Take 1 tablet (80 mg total) by mouth every evening. 30 tablet 6  . carvedilol (COREG) 6.25 MG tablet TAKE 1 TABLET (6.25 MG TOTAL) BY MOUTH 2 (TWO) TIMES DAILY WITH A MEAL. 60 tablet 6  . dapagliflozin propanediol (FARXIGA) 10 MG TABS tablet Take 1 tablet (10 mg total) by mouth daily before breakfast. 30 tablet 6  . digoxin (LANOXIN)  0.125 MG tablet TAKE 1 TABLET (125 MCG TOTAL) BY MOUTH DAILY. 30 tablet 3  . Evolocumab (REPATHA SURECLICK) 196 MG/ML SOAJ Inject 1 pen into the skin every 14 (fourteen) days. 2 mL 11  . furosemide (LASIX) 40 MG tablet TAKE 1 TABLET (40 MG TOTAL) BY MOUTH DAILY. 30 tablet 1  . glipiZIDE (GLUCOTROL) 10 MG tablet Take 1 tablet (10 mg total) by mouth 2 (two) times daily before a meal. 60 tablet 6  . glucose blood (  TRUE METRIX BLOOD GLUCOSE TEST) test strip 1 each by Other route 3 (three) times daily. 100 each 12  . insulin glargine (LANTUS SOLOSTAR) 100 UNIT/ML Solostar Pen Inject 20 Units into the skin daily. 6 mL 0  . Insulin Pen Needle (TRUEPLUS PEN NEEDLES) 31G X 5 MM MISC USE to inject insulin at bedtime. 100 each 6  . omeprazole (PRILOSEC) 20 MG capsule TAKE 1 CAPSULE (20 MG TOTAL) BY MOUTH DAILY. 30 capsule 1  . spironolactone (ALDACTONE) 50 MG tablet TAKE 1 TABLET (50 MG TOTAL) BY MOUTH DAILY. NEEDS APPOINTMENT FOR FURTHER REFILLS. 30 tablet 0  . collagenase (SANTYL) ointment Apply 1 application topically daily. 15 g 0  . silver sulfADIAZINE (SILVADENE) 1 % cream Apply 1 application topically daily. (Patient not taking: Reported on 03/26/2021) 50 g 0  . amoxicillin-clavulanate (AUGMENTIN) 875-125 MG tablet Take 1 tablet by mouth 2 (two) times daily. (Patient not taking: Reported on 03/26/2021) 20 tablet 0  . levofloxacin (LEVAQUIN) 250 MG tablet Take 1 tablet (250 mg total) by mouth daily. (Patient not taking: Reported on 03/26/2021) 10 tablet 0   Facility-Administered Medications Prior to Visit  Medication Dose Route Frequency Provider Last Rate Last Admin  . vancomycin (VANCOCIN) powder    PRN Evelina Bucy, DPM   1,000 mg at 01/24/21 1411     Review of Systems  Constitutional: Negative.   HENT: Negative.   Eyes: Negative.   Respiratory: Negative.   Cardiovascular: Negative.   Gastrointestinal: Negative.   Genitourinary: Negative.   Musculoskeletal:       Pain L foot , erythema of  5th metatarsal head  Skin: Negative.   Neurological: Negative.   Hematological: Negative.   Psychiatric/Behavioral: Negative.        Objective:   Physical Exam Vitals:   03/26/21 0853  BP: 116/74  Pulse: 88  SpO2: 100%  Weight: 270 lb (122.5 kg)  Height: $Remove'6\' 6"'eMHhrCm$  (1.981 m)    Gen: Pleasant, well-nourished, in no distress,  normal affect  ENT: No lesions,  mouth clear,  oropharynx clear, no postnasal drip  Neck: No JVD, no TMG, no carotid bruits  Lungs: No use of accessory muscles, no dullness to percussion, clear without rales or rhonchi  Cardiovascular: RRR, heart sounds normal, no murmur or gallops, no peripheral edema  Abdomen: soft and NT, no HSM,  BS normal  Musculoskeletal: No deformities, no cyanosis or clubbing, L foot in a boot, not examined  Neuro: alert, non focal  Skin: Warm, no lesions or rashes  No results found.  BMP Latest Ref Rng & Units 01/23/2021 12/18/2020 07/14/2020  Glucose 70 - 99 mg/dL 92 146(H) 145(H)  BUN 6 - 20 mg/dL 31(H) 26(H) 23(H)  Creatinine 0.61 - 1.24 mg/dL 1.55(H) 1.33(H) 1.06  BUN/Creat Ratio 9 - 20 - - -  Sodium 135 - 145 mmol/L 138 141 139  Potassium 3.5 - 5.1 mmol/L 4.6 4.1 4.2  Chloride 98 - 111 mmol/L 100 105 107  CO2 22 - 32 mmol/L $RemoveB'27 25 23  'htoxOgCU$ Calcium 8.9 - 10.3 mg/dL 9.5 9.3 9.2   Hepatic Function Latest Ref Rng & Units 12/08/2020 09/29/2020 06/23/2020  Total Protein 6.0 - 8.5 g/dL 7.0 7.4 7.2  Albumin 4.0 - 5.0 g/dL 4.2 3.9 3.1(L)  AST 0 - 40 IU/L 16 19 11(L)  ALT 0 - 44 IU/L $Remov'20 22 14  'DiVTry$ Alk Phosphatase 44 - 121 IU/L 64 45 40  Total Bilirubin 0.0 - 1.2 mg/dL 0.4 1.0 1.0  Bilirubin, Direct 0.00 -  0.40 mg/dL 0.12 0.1 -   CBC Latest Ref Rng & Units 01/23/2021 12/18/2020 06/27/2020  WBC 4.0 - 10.5 K/uL 15.6(H) 9.9 8.8  Hemoglobin 13.0 - 17.0 g/dL 13.5 13.8 12.7(L)  Hematocrit 39.0 - 52.0 % 41.6 42.4 39.2  Platelets 150 - 400 K/uL 362 222 290   Lab Results  Component Value Date   HGBA1C 6.7 (H) 12/18/2020         Assessment & Plan:  I personally reviewed all images and lab data in the [facility] system as well as any outside material available during this office visit and agree with the  radiology impressions.   Chronic combined systolic and diastolic congestive heart failure (HCC) Chronic systolic diastolic heart failure stable at this time  No change in dosing all medications  Essential hypertension Blood pressure well controlled no change in medicines  History of MI, acute, non ST segment elevation (HCC) History of coronary disease stable at this time  Controlled type 2 diabetes mellitus ([place]) Blood glucose in good control no change in Lantus and Farxiga glipizide dosing  Mixed diabetic hyperlipidemia associated with type 2 diabetes mellitus ([place]) Continued Repatha and Lipitor  CKD (chronic kidney disease), stage III Creatinine 1.55 with estimated GFR of 55  Asked patient to continue to watch his fluid intake we will continue to monitor this  Erectile dysfunction Referral to urology made he has failed Cialis   Thoma was seen today for pre-op exam.  Diagnoses and all orders for this visit:  Vasculogenic erectile dysfunction, unspecified vasculogenic erectile dysfunction type -     Ambulatory referral to Urology  Chronic combined systolic and diastolic congestive heart failure ([place])  Diabetes mellitus type 2 in obese (HCC) -     atorvastatin (LIPITOR) 80 MG tablet; Take 1 tablet (80 mg total) by mouth every evening. -     dapagliflozin propanediol (FARXIGA) 10 MG TABS tablet; Take 1 tablet (10 mg total) by mouth daily before breakfast. -     insulin glargine (LANTUS SOLOSTAR) 100 UNIT/ML Solostar Pen; Inject 20 Units into the skin daily. -     glucose blood (TRUE METRIX BLOOD GLUCOSE TEST) test strip; 1 each by Other route 3 (three) times daily. -     Insulin Pen Needle (TRUEPLUS PEN NEEDLES) 31G X 5 MM MISC; USE to inject insulin at bedtime.  Chronic heart failure, unspecified heart  failure type (HCC) -     carvedilol (COREG) 6.25 MG tablet; Take 1 tablet (6.25 mg total) by mouth 2 (two) times daily with a meal.  Coronary artery disease involving native coronary artery of native heart without angina pectoris -     glipiZIDE (GLUCOTROL) 10 MG tablet; Take 1 tablet (10 mg total) by mouth 2 (two) times daily before a meal.  Colon cancer screening -     Cologuard  Essential hypertension  History of MI, acute, non ST segment elevation ([place])  Controlled type 2 diabetes mellitus with stage 3 chronic kidney disease, with long-term current use of insulin (HCC)  Mixed diabetic hyperlipidemia associated with type 2 diabetes mellitus (HCC)  Stage 3 chronic kidney disease, unspecified whether stage 3a or 3b CKD ([place])  Other orders -     digoxin (LANOXIN) 0.125 MG tablet; Take 1 tablet (125 mcg total) by mouth daily. -     Evolocumab (REPATHA SURECLICK) 741 MG/ML SOAJ; Inject 1 pen into the skin every 14 (fourteen) days. -     furosemide (LASIX) 40 MG tablet; Take 1 tablet (40 mg  total) by mouth daily. -     omeprazole (PRILOSEC) 20 MG capsule; Take 1 capsule (20 mg total) by mouth daily. -     spironolactone (ALDACTONE) 50 MG tablet; Take 1 tablet (50 mg total) by mouth daily. Needs appointment for further refills.  This patient can be cleared medically for planned upcoming surgery on the right fifth toe

## 2021-03-26 ENCOUNTER — Other Ambulatory Visit: Payer: Self-pay

## 2021-03-26 ENCOUNTER — Ambulatory Visit
Payer: No Typology Code available for payment source | Attending: Critical Care Medicine | Admitting: Critical Care Medicine

## 2021-03-26 ENCOUNTER — Encounter: Payer: Self-pay | Admitting: Critical Care Medicine

## 2021-03-26 ENCOUNTER — Other Ambulatory Visit: Payer: Self-pay | Admitting: Pharmacist

## 2021-03-26 VITALS — BP 116/74 | HR 88 | Ht 78.0 in | Wt 270.0 lb

## 2021-03-26 DIAGNOSIS — Z794 Long term (current) use of insulin: Secondary | ICD-10-CM | POA: Diagnosis not present

## 2021-03-26 DIAGNOSIS — I509 Heart failure, unspecified: Secondary | ICD-10-CM | POA: Diagnosis not present

## 2021-03-26 DIAGNOSIS — Z23 Encounter for immunization: Secondary | ICD-10-CM

## 2021-03-26 DIAGNOSIS — I10 Essential (primary) hypertension: Secondary | ICD-10-CM

## 2021-03-26 DIAGNOSIS — N529 Male erectile dysfunction, unspecified: Secondary | ICD-10-CM

## 2021-03-26 DIAGNOSIS — N183 Chronic kidney disease, stage 3 unspecified: Secondary | ICD-10-CM

## 2021-03-26 DIAGNOSIS — E782 Mixed hyperlipidemia: Secondary | ICD-10-CM

## 2021-03-26 DIAGNOSIS — E1169 Type 2 diabetes mellitus with other specified complication: Secondary | ICD-10-CM

## 2021-03-26 DIAGNOSIS — I5042 Chronic combined systolic (congestive) and diastolic (congestive) heart failure: Secondary | ICD-10-CM

## 2021-03-26 DIAGNOSIS — E1122 Type 2 diabetes mellitus with diabetic chronic kidney disease: Secondary | ICD-10-CM

## 2021-03-26 DIAGNOSIS — I2510 Atherosclerotic heart disease of native coronary artery without angina pectoris: Secondary | ICD-10-CM

## 2021-03-26 DIAGNOSIS — E669 Obesity, unspecified: Secondary | ICD-10-CM

## 2021-03-26 DIAGNOSIS — I214 Non-ST elevation (NSTEMI) myocardial infarction: Secondary | ICD-10-CM

## 2021-03-26 DIAGNOSIS — Z1211 Encounter for screening for malignant neoplasm of colon: Secondary | ICD-10-CM

## 2021-03-26 MED ORDER — OMEPRAZOLE 20 MG PO CPDR: 20.0000 mg | DELAYED_RELEASE_CAPSULE | Freq: Every day | ORAL | 1 refills | Status: DC

## 2021-03-26 MED ORDER — CARVEDILOL 6.25 MG PO TABS: 6.2500 mg | ORAL_TABLET | Freq: Two times a day (BID) | ORAL | 6 refills | Status: DC

## 2021-03-26 MED ORDER — TRUEPLUS PEN NEEDLES 31G X 5 MM MISC: 6 refills | Status: DC

## 2021-03-26 MED ORDER — DAPAGLIFLOZIN PROPANEDIOL 10 MG PO TABS: 10.0000 mg | ORAL_TABLET | Freq: Every day | ORAL | 6 refills | Status: DC

## 2021-03-26 MED ORDER — DIGOXIN 125 MCG PO TABS: 125.0000 ug | ORAL_TABLET | Freq: Every day | ORAL | 3 refills | Status: DC

## 2021-03-26 MED ORDER — LANTUS SOLOSTAR 100 UNIT/ML ~~LOC~~ SOPN
20.0000 [IU] | PEN_INJECTOR | Freq: Every day | SUBCUTANEOUS | 0 refills | Status: DC
Start: 2021-03-26 — End: 2021-03-26

## 2021-03-26 MED ORDER — REPATHA SURECLICK 140 MG/ML ~~LOC~~ SOAJ: 1.0000 "pen " | SUBCUTANEOUS | 11 refills | Status: DC

## 2021-03-26 MED ORDER — ONETOUCH DELICA LANCETS 33G MISC: 2 refills | Status: DC

## 2021-03-26 MED ORDER — GLIPIZIDE 10 MG PO TABS: 10.0000 mg | ORAL_TABLET | Freq: Two times a day (BID) | ORAL | 6 refills | Status: DC

## 2021-03-26 MED ORDER — SPIRONOLACTONE 50 MG PO TABS: 50.0000 mg | ORAL_TABLET | Freq: Every day | ORAL | 0 refills | Status: DC

## 2021-03-26 MED ORDER — TRUE METRIX BLOOD GLUCOSE TEST VI STRP: 1.0000 | ORAL_STRIP | Freq: Three times a day (TID) | 12 refills | Status: DC

## 2021-03-26 MED ORDER — ONETOUCH VERIO W/DEVICE KIT: PACK | 0 refills | Status: DC

## 2021-03-26 MED ORDER — FUROSEMIDE 40 MG PO TABS: 40.0000 mg | ORAL_TABLET | Freq: Every day | ORAL | 1 refills | Status: DC

## 2021-03-26 MED ORDER — ATORVASTATIN CALCIUM 80 MG PO TABS: 80.0000 mg | ORAL_TABLET | Freq: Every evening | ORAL | 6 refills | Status: DC

## 2021-03-26 MED FILL — TRUEPLUS PEN NDL 31GX3/16: 31G X 5 MM | 25 days supply | Qty: 100 | Fill #1

## 2021-03-26 MED FILL — LANTUS SOLOSTAR 100 UNITS/M: 100 | 30 days supply | Qty: 6 | Fill #0

## 2021-03-26 NOTE — Progress Notes (Signed)
Pre-op for foot surgery.

## 2021-03-26 NOTE — Assessment & Plan Note (Signed)
Continued Repatha and Lipitor

## 2021-03-26 NOTE — Assessment & Plan Note (Signed)
Blood glucose in good control no change in Lantus and Farxiga glipizide dosing

## 2021-03-26 NOTE — Assessment & Plan Note (Signed)
Blood pressure well controlled no change in medicines 

## 2021-03-26 NOTE — Addendum Note (Signed)
Addended by: Ronette Deter on: 03/26/2021 09:42 AM   Modules accepted: Orders

## 2021-03-26 NOTE — Assessment & Plan Note (Signed)
History of coronary disease stable at this time

## 2021-03-26 NOTE — Assessment & Plan Note (Signed)
Referral to urology made he has failed Cialis

## 2021-03-26 NOTE — Patient Instructions (Signed)
We will continue to monitor your kidney function Keep yourself well-hydrated with water Refills on all your medications sent to our pharmacy Please obtain an eye exam this year I gave you resources for eye clinics you can look into You declined the Covid vaccine please let us know if you ever have a Covid-like illness so that we can test you immediately we now have oral outpatient treatments for this  A Pneumovax was given today  A Cologuard kit will be mailed to your home for colon cancer screening  Referral to urology will be made for erectile dysfunction  You are cleared medically for your planned upcoming foot surgery  Return to see Dr. Joya Gaskins in 3 months

## 2021-03-26 NOTE — Assessment & Plan Note (Signed)
Creatinine 1.55 with estimated GFR of 55  Asked patient to continue to watch his fluid intake we will continue to monitor this

## 2021-03-26 NOTE — Assessment & Plan Note (Signed)
Chronic systolic diastolic heart failure stable at this time  No change in dosing all medications

## 2021-03-27 ENCOUNTER — Telehealth: Payer: Self-pay | Admitting: Podiatry

## 2021-03-27 ENCOUNTER — Telehealth: Payer: Self-pay

## 2021-03-27 NOTE — Telephone Encounter (Signed)
[place] called and LVM from the [facility]. She states that she needs to know the CPT Codes for his upcoming sx on 04-19-23. Only because the The CPT codes on case entry do match the ones on the Prior Auth that had been submitted They want to make sure they have the correct CPT codes in the patient chart.    [phone] ext [phone] Please lvm if she does not answer

## 2021-03-27 NOTE — Patient Instructions (Addendum)
DUE TO COVID-19 ONLY ONE VISITOR IS ALLOWED TO COME WITH YOU AND STAY IN THE WAITING ROOM ONLY DURING PRE OP AND PROCEDURE DAY OF SURGERY. THE 1 VISITOR  MAY VISIT WITH YOU AFTER SURGERY IN YOUR PRIVATE ROOM DURING VISITING HOURS ONLY!  YOU NEED TO HAVE A COVID 19 TEST ON: 03/28/21 @ 10:00 AM , THIS TEST MUST BE DONE BEFORE SURGERY,  COVID TESTING SITE [address] [place] [phone], IT IS ON THE RIGHT GOING OUT [address] APPROXIMATELY  2 MINUTES PAST [employer] ON THE RIGHT. ONCE YOUR COVID TEST IS COMPLETED,  PLEASE BEGIN THE QUARANTINE INSTRUCTIONS AS OUTLINED IN YOUR HANDOUT.                Glenn McgeeHarold [name]   Your procedure is scheduled on: 03/30/21   [facility]   Report to admitting at: 3:15 PM.    Call this number if you have problems the morning of surgery [phone]    Remember:  NO SOLID FOOD AFTER MIDNIGHT THE NIGHT PRIOR TO SURGERY. NOTHING BY MOUTH EXCEPT CLEAR LIQUIDS UNTIL: 2:15 PM . PLEASE FINISH GATORADE DRINK PER SURGEON ORDER  WHICH NEEDS TO BE COMPLETED AT : 2:15 PM.  CLEAR LIQUID DIET  Foods Allowed                                                                     Foods Excluded  Coffee and tea, regular and decaf                             liquids that you cannot  Plain Jell-O any favor except red or purple                                           see through such as: Fruit ices (not with fruit pulp)                                     milk, soups, orange juice  Iced Popsicles                                    All solid food Carbonated beverages, regular and diet                                    Cranberry, grape and apple juices Sports drinks like Gatorade Lightly seasoned clear broth or consume(fat free) Sugar, honey syrup  Sample Menu Breakfast                                Lunch  Supper Cranberry juice                    Beef broth                             Chicken broth Jell-O                                     Grape juice                           Apple juice Coffee or tea                        Jell-O                                      Popsicle                                                Coffee or tea                        Coffee or tea  _____________________________________________________________________   BRUSH YOUR TEETH MORNING OF SURGERY AND RINSE YOUR MOUTH OUT, NO CHEWING GUM CANDY OR MINTS.   Take these medicines the morning of surgery with A SIP OF WATER: carvedilol,omeprazole,lanoxin.  How to Manage Your Diabetes Before and After Surgery  Why is it important to control my blood sugar before and after surgery? . Improving blood sugar levels before and after surgery helps healing and can limit problems. . A way of improving blood sugar control is eating a healthy diet by: o  Eating less sugar and carbohydrates o  Increasing activity/exercise o  Talking with your doctor about reaching your blood sugar goals . High blood sugars (greater than 180 mg/dL) can raise your risk of infections and slow your recovery, so you will need to focus on controlling your diabetes during the weeks before surgery. . Make sure that the doctor who takes care of your diabetes knows about your planned surgery including the date and location.  How do I manage my blood sugar before surgery? . Check your blood sugar at least 4 times a day, starting 2 days before surgery, to make sure that the level is not too high or low. o Check your blood sugar the morning of your surgery when you wake up and every 2 hours until you get to the Short Stay unit. . If your blood sugar is less than 70 mg/dL, you will need to treat for low blood sugar: o Do not take insulin. o Treat a low blood sugar (less than 70 mg/dL) with  cup of clear juice (cranberry or apple), 4 glucose tablets, OR glucose gel. o Recheck blood sugar in 15 minutes after treatment (to make sure  it is greater than 70 mg/dL). If your blood sugar is not greater than 70 mg/dL on recheck, call [phone] for further instructions. . Report your blood sugar to the short stay nurse when you get to Short Stay.  . If you are admitted  to the hospital after surgery: o Your blood sugar will be checked by the staff and you will probably be given insulin after surgery (instead of oral diabetes medicines) to make sure you have good blood sugar levels. o The goal for blood sugar control after surgery is 80-180 mg/dL.   WHAT DO I DO ABOUT MY DIABETES MEDICATION?  [name] Do not take oral diabetes medicines (pills) the morning of surgery.  . THE DAY BEFORE SURGERY, take glipizide only in the morning, DO NOT take the night dose.DO NOT take farxiga. Take ONLY half of Lantus insulin.        . THE MORNING OF SURGERY, take ONLY half of lantus insulin.  DO NOT TAKE ANY DIABETIC ORAL MEDICATIONS DAY OF YOUR SURGERY                               You may not have any metal on your body including hair pins and              piercings  Do not wear jewelry,lotions, powders or perfumes, deodorant             Men may shave face and neck.   Do not bring valuables to the hospital. [facility] IS NOT             RESPONSIBLE   FOR VALUABLES.  Contacts, dentures or bridgework may not be worn into surgery.  Leave suitcase in the car. After surgery it may be brought to your room.     Patients discharged the day of surgery will not be allowed to drive home. IF YOU ARE HAVING SURGERY AND GOING HOME THE SAME DAY, YOU MUST HAVE AN ADULT TO DRIVE YOU HOME AND BE WITH YOU FOR 24 HOURS. YOU MAY GO HOME BY TAXI OR UBER OR ORTHERWISE, BUT AN ADULT MUST ACCOMPANY YOU HOME AND STAY WITH YOU FOR 24 HOURS.  Name and phone number of your driver:  Special Instructions: N/A              Please read over the following fact sheets you were given: _____________________________________________________________________         [facility] - Preparing for Surgery Before surgery, you can play an important role.  Because skin is not sterile, your skin needs to be as free of germs as possible.  You can reduce the number of germs on your skin by washing with CHG (chlorahexidine gluconate) soap before surgery.  CHG is an antiseptic cleaner which kills germs and bonds with the skin to continue killing germs even after washing. Please DO NOT use if you have an allergy to CHG or antibacterial soaps.  If your skin becomes reddened/irritated stop using the CHG and inform your nurse when you arrive at Short Stay. Do not shave (including legs and underarms) for at least 48 hours prior to the first CHG shower.  You may shave your face/neck. Please follow these instructions carefully:  1.  Shower with CHG Soap the night before surgery and the  morning of Surgery.  2.  If you choose to wash your hair, wash your hair first as usual with your  normal  shampoo.  3.  After you shampoo, rinse your hair and body thoroughly to remove the  shampoo.  4.  Use CHG as you would any other liquid soap.  You can apply chg directly  to the skin and wash                       Gently with a scrungie or clean washcloth.  5.  Apply the CHG Soap to your body ONLY FROM THE NECK DOWN.   Do not use on face/ open                           Wound or open sores. Avoid contact with eyes, ears mouth and genitals (private parts).                       Wash face,  Genitals (private parts) with your normal soap.             6.  Wash thoroughly, paying special attention to the area where your surgery  will be performed.  7.  Thoroughly rinse your body with warm water from the neck down.  8.  DO NOT shower/wash with your normal soap after using and rinsing off  the CHG Soap.                9.  Pat yourself dry with a clean towel.            10.  Wear clean pajamas.            11.  Place clean sheets on your bed the night of your first shower and do not   sleep with pets. Day of Surgery : Do not apply any lotions/deodorants the morning of surgery.  Please wear clean clothes to the hospital/surgery center.  FAILURE TO FOLLOW THESE INSTRUCTIONS MAY RESULT IN THE CANCELLATION OF YOUR SURGERY PATIENT SIGNATURE_________________________________  NURSE SIGNATURE__________________________________  ________________________________________________________________________   [name]  An incentive spirometer is a tool that can help keep your lungs clear and active. This tool measures how well you are filling your lungs with each breath. Taking long deep breaths may help reverse or decrease the chance of developing breathing (pulmonary) problems (especially infection) following:  A long period of time when you are unable to move or be active. BEFORE THE PROCEDURE   If the spirometer includes an indicator to show your best effort, your nurse or respiratory therapist will set it to a desired goal.  If possible, sit up straight or lean slightly forward. Try not to slouch.  Hold the incentive spirometer in an upright position. INSTRUCTIONS FOR USE  1. Sit on the edge of your bed if possible, or sit up as far as you can in bed or on a chair. 2. Hold the incentive spirometer in an upright position. 3. Breathe out normally. 4. Place the mouthpiece in your mouth and seal your lips tightly around it. 5. Breathe in slowly and as deeply as possible, raising the piston or the ball toward the top of the column. 6. Hold your breath for 3-5 seconds or for as long as possible. Allow the piston or ball to fall to the bottom of the column. 7. Remove the mouthpiece from your mouth and breathe out normally. 8. Rest for a few seconds and repeat Steps 1 through 7 at least 10 times every 1-2 hours when you are awake. Take your time and take a few normal breaths between deep breaths. 9. The spirometer may include an indicator to show  your best effort. Use  the indicator as a goal to work toward during each repetition. 10. After each set of 10 deep breaths, practice coughing to be sure your lungs are clear. If you have an incision (the cut made at the time of surgery), support your incision when coughing by placing a pillow or rolled up towels firmly against it. Once you are able to get out of bed, walk around indoors and cough well. You may stop using the incentive spirometer when instructed by your caregiver.  RISKS AND COMPLICATIONS  Take your time so you do not get dizzy or light-headed.  If you are in pain, you may need to take or ask for pain medication before doing incentive spirometry. It is harder to take a deep breath if you are having pain. AFTER USE  Rest and breathe slowly and easily.  It can be helpful to keep track of a log of your progress. Your caregiver can provide you with a simple table to help with this. If you are using the spirometer at home, follow these instructions: SEEK MEDICAL CARE IF:   You are having difficultly using the spirometer.  You have trouble using the spirometer as often as instructed.  Your pain medication is not giving enough relief while using the spirometer.  You develop fever of 100.5 F (38.1 C) or higher. SEEK IMMEDIATE MEDICAL CARE IF:   You cough up bloody sputum that had not been present before.  You develop fever of 102 F (38.9 C) or greater.  You develop worsening pain at or near the incision site. MAKE SURE YOU:   Understand these instructions.  Will watch your condition.  Will get help right away if you are not doing well or get worse. Document Released: 04/28/2007 Document Revised: 03/09/2012 Document Reviewed: 06/29/2007 [facility] Patient Information 2014 [place], [place].   ________________________________________________________________________

## 2021-03-27 NOTE — Telephone Encounter (Signed)
DOS 03/30/2021  DEBRIDE WOUND LT - 11043 APPLICATION OF SKIN GRAFT LT - 15275 AMPUTATION METATARSECTOMY RT - 28140  [facility]   Notification or Prior Authorization is not required for the requested services  This [employer] plan does not currently require a prior authorization for these services. If you have general questions about the prior authorization requirements, please call us at [phone] or visit [email or web address] > Clinician Resources > Advance and Admission Notification Requirements. The number above acknowledges your notification. Please write this number down for future reference. Notification is not a guarantee of coverage or payment.  Decision ID #:[id number]

## 2021-03-27 NOTE — Telephone Encounter (Signed)
Patient called in expressing concerns regarding antibiotic prior to surgery, Please Advise

## 2021-03-27 NOTE — Progress Notes (Signed)
  Subjective:  Patient ID: [name], male    DOB: 1971/12/25,  MRN: [id number]  Chief Complaint  Patient presents with  . Routine Post Op    POV# 6 Pt denies fever/chills/nausea/vomiting. No new concerns.   50 y.o. male presents for wound care. Hx confirmed with patient. Denies new issues. Objective:  Physical Exam: Wound Location: right 5th MPJ, left 4th MPJ Wound Measurement: 2x1.5 right, 1x1.5 left Wound Base: Hypergranular left, granular right Peri-wound: Normal Exudate: Mod amount Serosanguinous exudate  Assessment:   1. Diabetic ulcer of left midfoot associated with type 2 diabetes mellitus, with muscle involvement without evidence of necrosis ([place])   2. Diabetic ulcer of other part of right foot associated with diabetes mellitus due to underlying condition, limited to breakdown of skin [facility])    Plan:  Patient was evaluated and treated and all questions answered.  Ulcer bilateral feet -Both wounds debrided. Left improving, right worsened. Probes to capsule but not bone. -Apligraf reapplied to left foot wound. Covered with mepitel, 4x4, ABD, kerlix. Lot #[id number] exp 04/03/21. -XR without signs of acute bone destruction -Given probe to capsule on the right foot, we did discuss proceeding with right 5th met head resection to prevent worsening infection. -Patient has failed all conservative therapy and wishes to proceed with surgical intervention. All risks, benefits, and alternatives discussed with patient. No guarantees given. Consent reviewed and signed by patient. -Planned procedures: Right 5th metatarsal head excision, debridement of both wounds with skin graft substitute. -ASA 3 - Patient with moderate systemic disease with functional limitations -Post-op anticoagulation: chemoprophylaxis not indicated  No follow-ups on file.

## 2021-03-27 NOTE — Telephone Encounter (Signed)
Called and spoke to patient. States his foot was hurting where it wasn't before. His wound area is slightly more pink, - about a dime size per patient. No redness, worsened drainage. Denies N/V/F/Ch. At this point  I think it best to hold off abx unless this worsens so that we have a more conclusive culture result when we do his surgery. Advised if things worsen to call back and we will do an antibiotic.

## 2021-03-28 ENCOUNTER — Encounter (HOSPITAL_COMMUNITY)
Admission: RE | Admit: 2021-03-28 | Discharge: 2021-03-28 | Disposition: A | Discharge status: Home / Self Care | Payer: No Typology Code available for payment source | Source: Ambulatory Visit | Attending: Podiatry | Admitting: Podiatry

## 2021-03-28 ENCOUNTER — Other Ambulatory Visit (HOSPITAL_COMMUNITY)
Admission: RE | Admit: 2021-03-28 | Discharge: 2021-03-28 | Disposition: A | Discharge status: Home / Self Care | Payer: No Typology Code available for payment source | Source: Ambulatory Visit | Attending: Podiatry | Admitting: Podiatry

## 2021-03-28 ENCOUNTER — Other Ambulatory Visit: Payer: Self-pay

## 2021-03-28 ENCOUNTER — Encounter (HOSPITAL_COMMUNITY): Payer: Self-pay

## 2021-03-28 DIAGNOSIS — Z20822 Contact with and (suspected) exposure to covid-19: Secondary | ICD-10-CM | POA: Diagnosis not present

## 2021-03-28 DIAGNOSIS — Z01812 Encounter for preprocedural laboratory examination: Secondary | ICD-10-CM | POA: Insufficient documentation

## 2021-03-28 HISTORY — DX: Chronic kidney disease, unspecified: N18.9

## 2021-03-28 LAB — CBC
HCT: 40.8 % (ref 39.0–52.0)
Hemoglobin: 13.7 g/dL (ref 13.0–17.0)
MCH: 27.5 pg (ref 26.0–34.0)
MCHC: 33.6 g/dL (ref 30.0–36.0)
MCV: 81.8 fL (ref 80.0–100.0)
Platelets: 255 10*3/uL (ref 150–400)
RBC: 4.99 MIL/uL (ref 4.22–5.81)
RDW: 13.8 % (ref 11.5–15.5)
WBC: 14.7 10*3/uL — ABNORMAL HIGH (ref 4.0–10.5)
nRBC: 0 % (ref 0.0–0.2)

## 2021-03-28 LAB — BASIC METABOLIC PANEL
Anion gap: 9 (ref 5–15)
BUN: 39 mg/dL — ABNORMAL HIGH (ref 6–20)
CO2: 24 mmol/L (ref 22–32)
Calcium: 9.1 mg/dL (ref 8.9–10.3)
Chloride: 101 mmol/L (ref 98–111)
Creatinine, Ser: 1.44 mg/dL — ABNORMAL HIGH (ref 0.61–1.24)
GFR, Estimated: >60 mL/min (ref >60)
Glucose, Bld: 148 mg/dL — ABNORMAL HIGH (ref 70–99)
Potassium: 4.3 mmol/L (ref 3.5–5.1)
Sodium: 134 mmol/L — ABNORMAL LOW (ref 135–145)

## 2021-03-28 LAB — GLUCOSE, CAPILLARY: Glucose-Capillary: 161 mg/dL — ABNORMAL HIGH (ref 70–99)

## 2021-03-28 LAB — HEMOGLOBIN A1C
Hgb A1c MFr Bld: 7.9 % — ABNORMAL HIGH (ref 4.8–5.6)
Mean Plasma Glucose: 180.03 mg/dL

## 2021-03-28 LAB — SARS CORONAVIRUS 2 (TAT 6-24 HRS): SARS Coronavirus 2: NEGATIVE

## 2021-03-28 NOTE — Progress Notes (Signed)
COVID Vaccine Completed: NO Date COVID Vaccine completed: COVID vaccine manufacturer: [employer]   PCP - Dr. [name]. LOV: 03/26/21 Cardiologist - Dr. Marca Ancona.  Chest x-ray -  EKG - 05/26/20 Stress Test -  ECHO - 05/26/20 Cardiac Cath -  Pacemaker/ICD device last checked:  Sleep Study - NO CPAP - NO  Fasting Blood Sugar - 120's Checks Blood Sugar ___1__ times a week.  Blood Thinner Instructions: Aspirin Instructions: No instructions . Last Dose:  Anesthesia review: Hx: [name],HTN,CHF,CAD,MI,CKD.  Patient denies shortness of breath, fever, cough and chest pain at PAT appointment   Patient verbalized understanding of instructions that were given to them at the PAT appointment. Patient was also instructed that they will need to review over the PAT instructions again at home before surgery.

## 2021-03-29 ENCOUNTER — Other Ambulatory Visit: Payer: Self-pay | Admitting: Podiatry

## 2021-03-29 MED ORDER — CIPROFLOXACIN HCL 500 MG PO TABS
500.0000 mg | ORAL_TABLET | Freq: Two times a day (BID) | ORAL | 0 refills | Status: DC
Start: 2021-03-29 — End: 2021-03-29

## 2021-03-29 MED ORDER — CLINDAMYCIN HCL 300 MG PO CAPS: 300.0000 mg | ORAL_CAPSULE | Freq: Two times a day (BID) | ORAL | 0 refills | Status: DC

## 2021-03-29 MED FILL — CLINDAMYCIN HCL 300 MG CAPS: 300 | 7 days supply | Qty: 14 | Fill #0

## 2021-03-29 MED FILL — CIPROFLOXACIN HCL 500 MG TA: 500 | 7 days supply | Qty: 14 | Fill #0

## 2021-03-29 NOTE — Anesthesia Preprocedure Evaluation (Addendum)
Anesthesia Evaluation  Patient identified by MRN, date of birth, ID band Patient awake    Reviewed: Allergy & Precautions, NPO status , Patient's Chart, lab work & pertinent test results, reviewed documented beta blocker date and time   History of Anesthesia Complications Negative for: history of anesthetic complications  Airway Mallampati: III  TM Distance: >3 FB Neck ROM: Full    Dental no notable dental hx. (+) Teeth Intact   Pulmonary neg pulmonary ROS,    Pulmonary exam normal breath sounds clear to auscultation       Cardiovascular hypertension, Pt. on medications and Pt. on home beta blockers + CAD, + Past MI, + Cardiac Stents and +CHF  Normal cardiovascular exam Rhythm:Regular Rate:Normal  Echo 08/2019 1. The left ventricle has mild-moderately reduced systolic function, with an ejection fraction of 40-45%. The cavity size was normal. Left ventricular diastolic Doppler parameters are consistent with impaired relaxation.  2. The right ventricle has normal systolic function. The cavity was normal. There is no increase in right ventricular wall thickness.  3. Trivial pericardial effusion is present.  4. Mild to moderate aortic annular calcification noted.  5. The tricuspid valve is grossly normal.  6. The aorta is normal unless otherwise noted.  7. The aortic root and ascending aorta are normal in size and structure.  8. The atrial septum is grossly normal.   EKG 05/26/20 NSR, Anterolateral MI, Inferior MI  Hx/o MI,  STEMI in territory of LAD 05/2011 S/P Stent   Neuro/Psych Peripheral neuropathy  Neuromuscular disease negative psych ROS   GI/Hepatic Neg liver ROS, GERD  Medicated and Controlled,  Endo/Other  diabetes, Type 2, Insulin DependentHyperlipidemia Obesity   Renal/GU Renal InsufficiencyRenal disease  negative genitourinary   Musculoskeletal Open wounds both feet   Abdominal (+) + obese,   Peds   Hematology negative hematology ROS (+)   Anesthesia Other Findings    Reproductive/Obstetrics ED                           Anesthesia Physical  Anesthesia Plan  ASA: III  Anesthesia Plan: MAC   Post-op Pain Management:    Induction: Intravenous  PONV Risk Score and Plan: 1 and Treatment may vary due to age or medical condition, Midazolam, Ondansetron and Propofol infusion  Airway Management Planned: Natural Airway and Simple Face Mask  Additional Equipment: None  Intra-op Plan:   Post-operative Plan:   Informed Consent: I have reviewed the patients History and Physical, chart, labs and discussed the procedure including the risks, benefits and alternatives for the proposed anesthesia with the patient or authorized representative who has indicated his/her understanding and acceptance.     Dental advisory given  Plan Discussed with: CRNA and Anesthesiologist  Anesthesia Plan Comments:        Anesthesia Quick Evaluation

## 2021-03-29 NOTE — Progress Notes (Signed)
Rx sent for antibiotics. Patient called with concern about appearance of his foot. He does appear to have worsening redness to the wound.

## 2021-03-29 NOTE — H&P (View-Only) (Signed)
Rx sent for antibiotics. Patient called with concern about appearance of his foot. He does appear to have worsening redness to the wound. 

## 2021-03-30 ENCOUNTER — Encounter (HOSPITAL_COMMUNITY)
Admission: RE | Disposition: A | Discharge status: Home / Self Care | Payer: Self-pay | Source: Home / Self Care | Attending: Podiatry

## 2021-03-30 ENCOUNTER — Encounter (HOSPITAL_COMMUNITY): Payer: Self-pay | Admitting: Podiatry

## 2021-03-30 ENCOUNTER — Encounter: Payer: Self-pay | Admitting: Podiatry

## 2021-03-30 ENCOUNTER — Ambulatory Visit (HOSPITAL_COMMUNITY): Payer: No Typology Code available for payment source | Admitting: Anesthesiology

## 2021-03-30 ENCOUNTER — Encounter (HOSPITAL_COMMUNITY): Payer: No Typology Code available for payment source | Admitting: Cardiology

## 2021-03-30 ENCOUNTER — Ambulatory Visit (HOSPITAL_COMMUNITY)
Admission: RE | Admit: 2021-03-30 | Discharge: 2021-03-30 | Disposition: A | Discharge status: Home / Self Care | Payer: No Typology Code available for payment source | Attending: Podiatry | Admitting: Podiatry

## 2021-03-30 ENCOUNTER — Ambulatory Visit (HOSPITAL_COMMUNITY): Payer: No Typology Code available for payment source | Admitting: Physician Assistant

## 2021-03-30 ENCOUNTER — Other Ambulatory Visit: Payer: Self-pay

## 2021-03-30 ENCOUNTER — Encounter: Payer: No Typology Code available for payment source | Admitting: Podiatry

## 2021-03-30 DIAGNOSIS — E1169 Type 2 diabetes mellitus with other specified complication: Secondary | ICD-10-CM | POA: Diagnosis not present

## 2021-03-30 DIAGNOSIS — M86171 Other acute osteomyelitis, right ankle and foot: Secondary | ICD-10-CM | POA: Diagnosis not present

## 2021-03-30 DIAGNOSIS — L97421 Non-pressure chronic ulcer of left heel and midfoot limited to breakdown of skin: Secondary | ICD-10-CM | POA: Insufficient documentation

## 2021-03-30 DIAGNOSIS — E11621 Type 2 diabetes mellitus with foot ulcer: Secondary | ICD-10-CM | POA: Insufficient documentation

## 2021-03-30 DIAGNOSIS — L97511 Non-pressure chronic ulcer of other part of right foot limited to breakdown of skin: Secondary | ICD-10-CM | POA: Insufficient documentation

## 2021-03-30 DIAGNOSIS — E08621 Diabetes mellitus due to underlying condition with foot ulcer: Secondary | ICD-10-CM

## 2021-03-30 DIAGNOSIS — L97512 Non-pressure chronic ulcer of other part of right foot with fat layer exposed: Secondary | ICD-10-CM

## 2021-03-30 HISTORY — PX: WOUND DEBRIDEMENT: SHX247

## 2021-03-30 HISTORY — PX: METATARSAL HEAD EXCISION: SHX5027

## 2021-03-30 LAB — GLUCOSE, CAPILLARY
Glucose-Capillary: 109 mg/dL — ABNORMAL HIGH (ref 70–99)
Glucose-Capillary: 104 mg/dL — ABNORMAL HIGH (ref 70–99)

## 2021-03-30 SURGERY — DEBRIDEMENT, WOUND
Anesthesia: Monitor Anesthesia Care | Site: Toe | Laterality: Right

## 2021-03-30 MED ORDER — CEFAZOLIN SODIUM-DEXTROSE 2-4 GM/100ML-% IV SOLN
INTRAVENOUS | Status: AC
  Filled 2021-03-30: qty 100

## 2021-03-30 MED ORDER — FENTANYL CITRATE (PF) 100 MCG/2ML IJ SOLN
INTRAMUSCULAR | Status: AC
  Filled 2021-03-30: qty 2

## 2021-03-30 MED ORDER — SODIUM CHLORIDE 0.9 % IR SOLN
Status: DC | PRN
  Administered 2021-03-30: 3000 mL
  Administered 2021-03-30: 1000 mL

## 2021-03-30 MED ORDER — BUPIVACAINE HCL (PF) 0.5 % IJ SOLN
INTRAMUSCULAR | Status: DC | PRN
  Administered 2021-03-30 (×2): 10 mL

## 2021-03-30 MED ORDER — PROPOFOL 10 MG/ML IV BOLUS
INTRAVENOUS | Status: AC
  Filled 2021-03-30: qty 20

## 2021-03-30 MED ORDER — CEFAZOLIN SODIUM-DEXTROSE 2-3 GM-%(50ML) IV SOLR
INTRAVENOUS | Status: DC | PRN
  Administered 2021-03-30: 2 g via INTRAVENOUS

## 2021-03-30 MED ORDER — ONDANSETRON HCL 4 MG/2ML IJ SOLN: 4.0000 mg | Freq: Once | INTRAMUSCULAR | Status: DC | PRN

## 2021-03-30 MED ORDER — ORAL CARE MOUTH RINSE: 15.0000 mL | Freq: Once | OROMUCOSAL | Status: AC

## 2021-03-30 MED ORDER — FENTANYL CITRATE (PF) 100 MCG/2ML IJ SOLN
25.0000 ug | INTRAMUSCULAR | Status: DC | PRN
Start: 2021-03-30 — End: 2021-03-31

## 2021-03-30 MED ORDER — PROPOFOL 500 MG/50ML IV EMUL
INTRAVENOUS | Status: DC | PRN
  Administered 2021-03-30: 75 ug/kg/min via INTRAVENOUS

## 2021-03-30 MED ORDER — OXYCODONE HCL 5 MG/5ML PO SOLN: 5.0000 mg | Freq: Once | ORAL | Status: DC | PRN

## 2021-03-30 MED ORDER — MIDAZOLAM HCL 2 MG/2ML IJ SOLN
INTRAMUSCULAR | Status: AC
  Filled 2021-03-30: qty 2

## 2021-03-30 MED ORDER — LACTATED RINGERS IV SOLN: INTRAVENOUS | Status: DC

## 2021-03-30 MED ORDER — FENTANYL CITRATE (PF) 100 MCG/2ML IJ SOLN
INTRAMUSCULAR | Status: DC | PRN
  Administered 2021-03-30: 100 ug via INTRAVENOUS

## 2021-03-30 MED ORDER — OXYCODONE HCL 5 MG PO TABS: 5.0000 mg | ORAL_TABLET | Freq: Once | ORAL | Status: DC | PRN

## 2021-03-30 MED ORDER — BUPIVACAINE HCL (PF) 0.5 % IJ SOLN
INTRAMUSCULAR | Status: AC
  Filled 2021-03-30: qty 30

## 2021-03-30 MED ORDER — MIDAZOLAM HCL 5 MG/5ML IJ SOLN
INTRAMUSCULAR | Status: DC | PRN
  Administered 2021-03-30: 2 mg via INTRAVENOUS

## 2021-03-30 MED ORDER — PROPOFOL 500 MG/50ML IV EMUL
INTRAVENOUS | Status: DC | PRN
  Administered 2021-03-30: 30 mg via INTRAVENOUS

## 2021-03-30 MED ORDER — CHLORHEXIDINE GLUCONATE 0.12 % MT SOLN
15.0000 mL | Freq: Once | OROMUCOSAL | Status: AC
  Administered 2021-03-30: 15 mL via OROMUCOSAL

## 2021-03-30 SURGICAL SUPPLY — 71 items
APL PRP STRL LF DISP 70% ISPRP (MISCELLANEOUS) ×2
BLADE AVERAGE 25X9 (BLADE) ×1 IMPLANT
BLADE HEX COATED 2.75 (ELECTRODE) ×3 IMPLANT
BLADE OSCILLATING/SAGITTAL (BLADE)
BLADE SURG 15 STRL LF DISP TIS (BLADE) ×2 IMPLANT
BLADE SURG 15 STRL SS (BLADE) ×3
BLADE SW THK.38XMED LNG THN (BLADE) IMPLANT
BNDG CMPR 9X4 STRL LF SNTH (GAUZE/BANDAGES/DRESSINGS) ×1
BNDG ELASTIC 3X5.8 VLCR STR LF (GAUZE/BANDAGES/DRESSINGS) ×3 IMPLANT
BNDG ELASTIC 4X5.8 VLCR STR LF (GAUZE/BANDAGES/DRESSINGS) ×1 IMPLANT
BNDG ELASTIC 6X5.8 VLCR STR LF (GAUZE/BANDAGES/DRESSINGS) ×1 IMPLANT
BNDG ESMARK 4X9 LF (GAUZE/BANDAGES/DRESSINGS) ×3 IMPLANT
BNDG GAUZE ELAST 4 BULKY (GAUZE/BANDAGES/DRESSINGS) ×3 IMPLANT
CHLORAPREP W/TINT 26 (MISCELLANEOUS) ×3 IMPLANT
COVER BACK TABLE 60X90IN (DRAPES) ×3 IMPLANT
COVER WAND RF STERILE (DRAPES) IMPLANT
CUFF TOURN SGL QUICK 18X4 (TOURNIQUET CUFF) IMPLANT
DRAPE EXTREMITY T 121X128X90 (DISPOSABLE) ×3 IMPLANT
DRAPE IMP U-DRAPE 54X76 (DRAPES) ×3 IMPLANT
DRAPE U-SHAPE 47X51 STRL (DRAPES) ×3 IMPLANT
DRSG EMULSION OIL 3X3 NADH (GAUZE/BANDAGES/DRESSINGS) ×3 IMPLANT
DRSG MEPITEL 8X12 (GAUZE/BANDAGES/DRESSINGS) ×2 IMPLANT
DRSG PAD ABDOMINAL 8X10 ST (GAUZE/BANDAGES/DRESSINGS) ×2 IMPLANT
ELECT REM PT RETURN 15FT ADLT (MISCELLANEOUS) ×3 IMPLANT
GAUZE 4X4 16PLY RFD (DISPOSABLE) IMPLANT
GAUZE SPONGE 4X4 12PLY STRL (GAUZE/BANDAGES/DRESSINGS) ×4 IMPLANT
GAUZE XEROFORM 1X8 LF (GAUZE/BANDAGES/DRESSINGS) IMPLANT
GAUZE XEROFORM 5X9 LF (GAUZE/BANDAGES/DRESSINGS) IMPLANT
GLOVE SRG 8 PF TXTR STRL LF DI (GLOVE) ×2 IMPLANT
GLOVE SURG ENC MOIS LTX SZ7.5 (GLOVE) ×3 IMPLANT
GLOVE SURG LTX SZ8 (GLOVE) ×3 IMPLANT
GLOVE SURG UNDER POLY LF SZ8 (GLOVE) ×3
GOWN STRL REUS W/ TWL LRG LVL3 (GOWN DISPOSABLE) ×2 IMPLANT
GOWN STRL REUS W/TWL LRG LVL3 (GOWN DISPOSABLE) ×3
GOWN STRL REUS W/TWL XL LVL3 (GOWN DISPOSABLE) ×6 IMPLANT
HANDPIECE INTERPULSE COAX TIP (DISPOSABLE) ×3
HEMOSTAT SURGICEL 2X3 (HEMOSTASIS) ×1 IMPLANT
KIT BASIN OR (CUSTOM PROCEDURE TRAY) ×3 IMPLANT
MANIFOLD NEPTUNE II (INSTRUMENTS) ×3 IMPLANT
MATRIX PURAPLYAM COM 3X4 12 SQ (Tissue) IMPLANT
MEMBRANE AMNIO 2.5X2.5 AFFNTY (Tissue) ×1 IMPLANT
NDL HYPO 25X1 1.5 SAFETY (NEEDLE) ×2 IMPLANT
NEEDLE HYPO 22GX1.5 SAFETY (NEEDLE) ×1 IMPLANT
NEEDLE HYPO 25X1 1.5 SAFETY (NEEDLE) ×3 IMPLANT
NS IRRIG 1000ML POUR BTL (IV SOLUTION) IMPLANT
PACK ORTHO EXTREMITY (CUSTOM PROCEDURE TRAY) ×3 IMPLANT
PADDING CAST ABS 4INX4YD NS (CAST SUPPLIES) ×1
PADDING CAST ABS COTTON 4X4 ST (CAST SUPPLIES) ×2 IMPLANT
PENCIL SMOKE EVACUATOR (MISCELLANEOUS) IMPLANT
PURAPLYAM COM 3X4 12 SQ (Tissue) ×3 IMPLANT
SET HNDPC FAN SPRY TIP SCT (DISPOSABLE) IMPLANT
SET IRRIG Y TYPE TUR BLADDER L (SET/KITS/TRAYS/PACK) IMPLANT
SLEEVE SCD COMPRESS KNEE MED (STOCKING) ×3 IMPLANT
SPONGE SURGIFOAM ABS GEL 100 (HEMOSTASIS) IMPLANT
STAPLER VISISTAT 35W (STAPLE) ×3 IMPLANT
STOCKINETTE 8 INCH (MISCELLANEOUS) ×6 IMPLANT
SUT ETHILON 3 0 PS 1 (SUTURE) ×1 IMPLANT
SUT ETHILON 4 0 PS 2 18 (SUTURE) IMPLANT
SUT MNCRL AB 3-0 PS2 18 (SUTURE) IMPLANT
SUT MNCRL AB 4-0 PS2 18 (SUTURE) IMPLANT
SUT MON AB 5-0 PS2 18 (SUTURE) IMPLANT
SUT VIC AB 3-0 FS2 27 (SUTURE) ×3 IMPLANT
SUT VIC AB 4-0 PS2 18 (SUTURE) ×3 IMPLANT
SWAB COLLECTION DEVICE MRSA (MISCELLANEOUS) ×1 IMPLANT
SWAB CULTURE ESWAB REG 1ML (MISCELLANEOUS) ×1 IMPLANT
SYR BULB EAR ULCER 3OZ GRN STR (SYRINGE) ×3 IMPLANT
SYR BULB IRRIG 60ML STRL (SYRINGE) ×1 IMPLANT
SYR CONTROL 10ML LL (SYRINGE) ×3 IMPLANT
TOWEL OR 17X26 10 PK STRL BLUE (TOWEL DISPOSABLE) ×3 IMPLANT
UNDERPAD 30X36 HEAVY ABSORB (UNDERPADS AND DIAPERS) ×3 IMPLANT
YANKAUER SUCT BULB TIP NO VENT (SUCTIONS) ×3 IMPLANT

## 2021-03-30 NOTE — Brief Op Note (Signed)
03/30/2021  6:46 PM  PATIENT:  [name]  50 y.o. male  PRE-OPERATIVE DIAGNOSIS:  Diabetic ulcers bilat  POST-OPERATIVE DIAGNOSIS:  Diabetic ulcers bilat  PROCEDURE:  Procedure(s): DEBRIDEMENT WOUND WITH SKIN GRAFT SUBSTITUTE APPLICATION (Bilateral) PROXIMAL 5TH TOE RESECTION ON RIGHT (Right)  SURGEON:  Surgeon(s) and Role:    * Evelina Bucy, DPM - Primary  PHYSICIAN ASSISTANT:   ASSISTANTS: none   ANESTHESIA:   local and MAC  EBL:  10 mL   BLOOD ADMINISTERED:none  DRAINS: none   LOCAL MEDICATIONS USED:  MARCAINE    and Amount: 10 ml  SPECIMEN:   ID Type Source Tests Collected by Time Destination  1 : right 5th metatarsal bone Tissue PATH Other SURGICAL PATHOLOGY Evelina Bucy, DPM 03/30/2021 1819   A : right foot specimen swab Tissue PATH Other AEROBIC/ANAEROBIC CULTURE W GRAM STAIN (SURGICAL/DEEP WOUND) Evelina Bucy, DPM 03/30/2021 1817   B : right 5th metatarsal bone Tissue PATH Other AEROBIC/ANAEROBIC CULTURE W GRAM STAIN (SURGICAL/DEEP WOUND) Evelina Bucy, DPM 03/30/2021 1820    DISPOSITION OF SPECIMEN:  pathology, micro  COUNTS:  YES  TOURNIQUET:   Total Tourniquet Time Documented: Calf (Right) - 28 minutes Total: Calf (Right) - 28 minutes   DICTATION: .Viviann Spare Dictation  PLAN OF CARE: Discharge to home after PACU  PATIENT DISPOSITION:  PACU - hemodynamically stable.   Delay start of Pharmacological VTE agent (>24hrs) due to surgical blood loss or risk of bleeding: not applicable

## 2021-03-30 NOTE — H&P (Signed)
Anesthesia H&P Update: History and Physical Exam reviewed; patient is OK for planned anesthetic and procedure. ? ?

## 2021-03-30 NOTE — Discharge Instructions (Signed)
  After Surgery Instructions   1) If you are recuperating from surgery anywhere other than home, please be sure to leave us the number where you can be reached.  2) Go directly home and rest.  3) Keep the operated foot(feet) elevated six inches above the hip when sitting or lying down. This will help control swelling and pain.  4) Support the elevated foot and leg with pillows. DO NOT PLACE PILLOWS UNDER THE KNEE.  5) DO NOT REMOVE or get your bandages WET, unless you were given different instructions by your doctor to do so. This increases the risk of infection.  6) Wear your surgical shoe or surgical boot at all times when you are up on your feet.  7) A limited amount of pain and swelling may occur. The skin may take on a bruised appearance. DO NOT BE ALARMED, THIS IS NORMAL.  8) For slight pain and swelling, apply an ice pack directly over the bandages for 15 minutes only out of each hour of the day. Continue until seen in the office for your first post op visit. DO NOT APPLY ANY FORM OF HEAT TO THE AREA.  9) Have prescriptions filled immediately and take as directed.  10) Drink lots of liquids, water and juice to stay hydrated.  11) CALL IMMEDIATELY IF:  *Bleeding continues until the following day of surgery  *Pain increases and/or does not respond to medication  *Bandages or cast appears to tight  *If your bandage gets wet  *Trip, fall or stump your surgical foot  *If your temperature goes above 101  *If you have ANY questions at all  12) You are expected to be weightbearing after your surgery.   If you need to reach the nurse for any reason, please call: [place]: [phone] [place]: [phone] [place]: [phone]   General Anesthesia, Adult, Care After This sheet gives you information about how to care for yourself after your procedure. Your health care provider may also give you more specific instructions. If you have problems or  questions, contact your health care provider. What can I expect after the procedure? After the procedure, the following side effects are common:  Pain or discomfort at the IV site.  Nausea.  Vomiting.  Sore throat.  Trouble concentrating.  Feeling cold or chills.  Feeling weak or tired.  Sleepiness and fatigue.  Soreness and body aches. These side effects can affect parts of the body that were not involved in surgery. Follow these instructions at home: For the time period you were told by your health care provider:  Rest.  Do not participate in activities where you could fall or become injured.  Do not drive or use machinery.  Do not drink alcohol.  Do not take sleeping pills or medicines that cause drowsiness.  Do not make important decisions or sign legal documents.  Do not take care of children on your own.   Eating and drinking  Follow any instructions from your health care provider about eating or drinking restrictions.  When you feel hungry, start by eating small amounts of foods that are soft and easy to digest (bland), such as toast. Gradually return to your regular diet.  Drink enough fluid to keep your urine pale yellow.  If you vomit, rehydrate by drinking water, juice, or clear broth. General instructions  If you have sleep apnea, surgery and certain medicines can increase your risk for breathing problems. Follow instructions from your health care provider about wearing   your sleep device: ? Anytime you are sleeping, including during daytime naps. ? While taking prescription pain medicines, sleeping medicines, or medicines that make you drowsy.  Have a responsible adult stay with you for the time you are told. It is important to have someone help care for you until you are awake and alert.  Return to your normal activities as told by your health care provider. Ask your health care provider what activities are safe for you.  Take over-the-counter and  prescription medicines only as told by your health care provider.  If you smoke, do not smoke without supervision.  Keep all follow-up visits as told by your health care provider. This is important. Contact a health care provider if:  You have nausea or vomiting that does not get better with medicine.  You cannot eat or drink without vomiting.  You have pain that does not get better with medicine.  You are unable to pass urine.  You develop a skin rash.  You have a fever.  You have redness around your IV site that gets worse. Get help right away if:  You have difficulty breathing.  You have chest pain.  You have blood in your urine or stool, or you vomit blood. Summary  After the procedure, it is common to have a sore throat or nausea. It is also common to feel tired.  Have a responsible adult stay with you for the time you are told. It is important to have someone help care for you until you are awake and alert.  When you feel hungry, start by eating small amounts of foods that are soft and easy to digest (bland), such as toast. Gradually return to your regular diet.  Drink enough fluid to keep your urine pale yellow.  Return to your normal activities as told by your health care provider. Ask your health care provider what activities are safe for you. This information is not intended to replace advice given to you by your health care provider. Make sure you discuss any questions you have with your health care provider. Document Revised: 08/31/2020 Document Reviewed: 03/30/2020 Elsevier Patient Education  2021 Elsevier Inc.   

## 2021-03-30 NOTE — Interval H&P Note (Signed)
History and Physical Interval Note:  03/30/2021 5:05 PM  [name]  has presented today for surgery, with the diagnosis of Diabetic ulcers bilat.  The various methods of treatment have been discussed with the patient and family. After consideration of risks, benefits and other options for treatment, the patient has consented to  Procedure(s): DEBRIDEMENT WOUND WITH SKIN GRAFT SUBSTITUTE APPLICATION (Bilateral) METATARSAL HEAD EXCISION (Right) as a surgical intervention.  The patient's history has been reviewed, patient examined, no change in status, stable for surgery.  I have reviewed the patient's chart and labs.  Questions were answered to the patient's satisfaction.     Park Liter

## 2021-03-30 NOTE — Progress Notes (Signed)
Orthopedic Tech Progress Note Patient Details:  [name] 11/09/1971 [id number]  Patient ID: [name], male   DOB: 07-10-1971, 50 y.o.   MRN: [id number]   [name] post op shoes 03/30/2021, 7:00 PM

## 2021-03-30 NOTE — Transfer of Care (Signed)
Immediate Anesthesia Transfer of Care Note  Patient: [name]  Procedure(s) Performed: DEBRIDEMENT WOUND WITH SKIN GRAFT SUBSTITUTE APPLICATION (Bilateral ) PROXIMAL 5TH TOE RESECTION ON RIGHT (Right Toe)  Patient Location: PACU  Anesthesia Type:General  Level of Consciousness: drowsy and patient cooperative  Airway & Oxygen Therapy: Patient Spontanous Breathing and Patient connected to face mask oxygen  Post-op Assessment: Report given to RN and Post -op Vital signs reviewed and stable  Post vital signs: Reviewed and stable  Last Vitals:  Vitals Value Taken Time  BP    Temp    Pulse 73 03/30/21 1848  Resp    SpO2 100 % 03/30/21 1848  Vitals shown include unvalidated device data.  Last Pain:  Vitals:   03/30/21 1519  TempSrc: Oral  PainSc: 2       Patients Stated Pain Goal: 5 (03/30/21 1519)  Complications: No complications documented.

## 2021-03-30 NOTE — Anesthesia Postprocedure Evaluation (Signed)
Anesthesia Post Note  Patient: [name]  Procedure(s) Performed: DEBRIDEMENT WOUND WITH SKIN GRAFT SUBSTITUTE APPLICATION (Bilateral ) PROXIMAL 5TH TOE RESECTION ON RIGHT (Right Toe)     Patient location during evaluation: PACU Anesthesia Type: MAC Level of consciousness: awake and alert and oriented Pain management: pain level controlled Vital Signs Assessment: post-procedure vital signs reviewed and stable Respiratory status: spontaneous breathing, nonlabored ventilation and respiratory function stable Cardiovascular status: stable and blood pressure returned to baseline Postop Assessment: no apparent nausea or vomiting Anesthetic complications: no   No complications documented.  Last Vitals:  Vitals:   03/30/21 1915 03/30/21 1921  BP: 117/78 118/87  Pulse: 74 74  Resp: 18 17  Temp:  36.6 C  SpO2: 99% 98%    Last Pain:  Vitals:   03/30/21 1921  TempSrc: Oral  PainSc: 0-No pain                 [name] A.

## 2021-03-30 NOTE — Op Note (Incomplete)
  Patient Name: [name] DOB: 1971/06/25  MRN: [id number]   Date of Surgery: 03/30/2021  Surgeon: Dr. Hardie Pulley, DPM Assistants: ***  Pre-operative Diagnosis:  Pre-Op Diagnosis Codes:    * Diabetic ulcer of left midfoot associated with type 2 diabetes mellitus, with muscle involvement without evidence of necrosis ([place]) [X61.470, L97.425]    * Diabetic ulcer of other part of right foot associated with diabetes mellitus due to underlying condition, limited to breakdown of skin ([place]) [L29.574, L97.511] Post-operative Diagnosis:  Post-Op Diagnosis Codes:    * Diabetic ulcer of left midfoot associated with type 2 diabetes mellitus, with muscle involvement without evidence of necrosis ([place]) [B34.037, L97.425]    * Diabetic ulcer of other part of right foot associated with diabetes mellitus due to underlying condition, limited to breakdown of skin ([place]) [Q96.438, L97.511] Procedures:  1) Left foot wound debridement, partial closure  2) Left foot application of skin graft substitute.  3) Right foot metatarsectomy  4) Right 5th toe partial proximal phalangectomy  5) Right foot application of skin graft substitute. Pathology/Specimens: ID Type Source Tests Collected by Time Destination  1 : right 5th metatarsal bone Tissue PATH Other SURGICAL PATHOLOGY Evelina Bucy, DPM 03/30/2021 1819   A : right foot specimen swab Tissue PATH Other AEROBIC/ANAEROBIC CULTURE W GRAM STAIN (SURGICAL/DEEP WOUND) Evelina Bucy, DPM 03/30/2021 1817   B : right 5th metatarsal bone Tissue PATH Other AEROBIC/ANAEROBIC CULTURE W Lonell Grandchild STAIN (SURGICAL/DEEP WOUND) Evelina Bucy, DPM 03/30/2021 1820    Anesthesia: MAC/local Hemostasis:  Total Tourniquet Time Documented: Calf (Right) - 28 minutes Total: Calf (Right) - 28 minutes  Estimated Blood Loss: 10 mL Materials:  Implant Name Type Inv. Item Serial No. Manufacturer Lot No. LRB No. Used Action  IMPL PURAPLYAM-COM 3X4 12 SQ - [id number] Tissue IMPL  PURAPLYAM-COM 3X4 12 SQ [id number] [employer] [id number] Right 1 Implanted  MEMBRANE AMNIO 2.5X2.5 AFFNTY - [id number] Tissue MEMBRANE AMNIO 2.5X2.5 AFFNTY [id number] [employer]  Left 1 Implanted   Medications: Marcaine 0.5% plain, 20 ml Complications: none  Indications for Procedure:  This is a 50 y.o. male with a ***   Procedure in Detail: Patient was identified in pre-operative holding area. Formal consent was signed and the *** lower extremity was marked. Patient was brought back to the operating room. Anesthesia was induced. The extremity was prepped and draped in the usual sterile fashion. Timeout was taken to confirm patient name, laterality, and procedure prior to incision.   Pre-debridement the wound measured 1.5x1.5. Post-debridement and retention sutures the wound measured 2x0.3  Attention was then directed to the right foot  The foot was then dressed with ***. Patient tolerated the procedure well.     Dr. [name] was scrubbed and present for the *** procedure, and assisted in ***.  Disposition: Following a period of post-operative monitoring, patient will be transferred ***.

## 2021-03-31 ENCOUNTER — Other Ambulatory Visit (HOSPITAL_COMMUNITY): Payer: Self-pay

## 2021-04-02 ENCOUNTER — Encounter (HOSPITAL_COMMUNITY): Payer: Self-pay | Admitting: Podiatry

## 2021-04-03 ENCOUNTER — Other Ambulatory Visit: Payer: Self-pay

## 2021-04-03 ENCOUNTER — Ambulatory Visit (INDEPENDENT_AMBULATORY_CARE_PROVIDER_SITE_OTHER): Payer: No Typology Code available for payment source | Admitting: Podiatry

## 2021-04-03 DIAGNOSIS — L97425 Non-pressure chronic ulcer of left heel and midfoot with muscle involvement without evidence of necrosis: Secondary | ICD-10-CM

## 2021-04-03 DIAGNOSIS — Z9889 Other specified postprocedural states: Secondary | ICD-10-CM

## 2021-04-03 DIAGNOSIS — E11621 Type 2 diabetes mellitus with foot ulcer: Secondary | ICD-10-CM

## 2021-04-03 LAB — SURGICAL PATHOLOGY

## 2021-04-04 ENCOUNTER — Telehealth: Payer: Self-pay | Admitting: Podiatry

## 2021-04-04 LAB — AEROBIC/ANAEROBIC CULTURE W GRAM STAIN (SURGICAL/DEEP WOUND): Gram Stain: NONE SEEN

## 2021-04-04 NOTE — Telephone Encounter (Signed)
Patient called and seen that results are avail in my chart and wanted to know if he needed to have a prescription for antibiotics. Please advise

## 2021-04-04 NOTE — Telephone Encounter (Signed)
[facility] called in to report update on the culture for the following patient.   Gram stain, originally had gram positive cocci, the culture also grew gram negative rods, and gram positive rods  Please Advise

## 2021-04-05 ENCOUNTER — Ambulatory Visit (INDEPENDENT_AMBULATORY_CARE_PROVIDER_SITE_OTHER): Payer: No Typology Code available for payment source | Admitting: Podiatry

## 2021-04-05 ENCOUNTER — Other Ambulatory Visit: Payer: Self-pay

## 2021-04-05 DIAGNOSIS — L97425 Non-pressure chronic ulcer of left heel and midfoot with muscle involvement without evidence of necrosis: Secondary | ICD-10-CM

## 2021-04-05 DIAGNOSIS — E11621 Type 2 diabetes mellitus with foot ulcer: Secondary | ICD-10-CM

## 2021-04-05 LAB — AEROBIC/ANAEROBIC CULTURE W GRAM STAIN (SURGICAL/DEEP WOUND)

## 2021-04-05 MED ORDER — CIPROFLOXACIN HCL 500 MG PO TABS
500.0000 mg | ORAL_TABLET | Freq: Two times a day (BID) | ORAL | 0 refills | Status: DC
  Filled 2021-04-05: qty 14, 7d supply, fill #0

## 2021-04-05 MED FILL — Carvedilol Tab 6.25 MG: ORAL | 30 days supply | Qty: 60 | Fill #0 | Status: AC

## 2021-04-05 MED FILL — Furosemide Tab 40 MG: ORAL | 30 days supply | Qty: 30 | Fill #0 | Status: AC

## 2021-04-05 MED FILL — Digoxin Tab 125 MCG (0.125 MG): ORAL | 30 days supply | Qty: 30 | Fill #0 | Status: AC

## 2021-04-05 MED FILL — Spironolactone Tab 50 MG: ORAL | 30 days supply | Qty: 30 | Fill #0 | Status: AC

## 2021-04-05 NOTE — Progress Notes (Signed)
  Subjective:  Patient ID: [name], male    DOB: [birth year],  MRN: [id number]  Chief Complaint  Patient presents with  . Routine Post Op    POV -pt denies N/V/F/Ch -pt states foot was "darker" last night then today -no swelling with 1/10 pain -pt has completed abx   50 y.o. male presents for wound care. Hx confirmed with patient. Objective:  Physical Exam: Right 5th MPJ wound with some fibrosis to the wound bed, slight active warmth, erythema. No malodor. No purulence. Sanguinous drainage noted. Sutures intact. Assessment:   1. Diabetic ulcer of left midfoot associated with type 2 diabetes mellitus, with muscle involvement without evidence of necrosis [facility])    Plan:  Patient was evaluated and treated and all questions answered.  Ulcer bilateral feet -Left foot left intact -Right foot healing well, improving. Packing replaced today. Will be pulled in office tomorrow. -Refill abx. Per c/s continue with cipro.  No follow-ups on file.

## 2021-04-05 NOTE — Telephone Encounter (Signed)
Seen today and abx sent to pharmacy

## 2021-04-05 NOTE — Telephone Encounter (Signed)
Noted thanks °

## 2021-04-06 ENCOUNTER — Ambulatory Visit (INDEPENDENT_AMBULATORY_CARE_PROVIDER_SITE_OTHER): Payer: No Typology Code available for payment source | Admitting: Podiatry

## 2021-04-06 DIAGNOSIS — L97511 Non-pressure chronic ulcer of other part of right foot limited to breakdown of skin: Secondary | ICD-10-CM

## 2021-04-06 DIAGNOSIS — L97425 Non-pressure chronic ulcer of left heel and midfoot with muscle involvement without evidence of necrosis: Secondary | ICD-10-CM | POA: Diagnosis not present

## 2021-04-06 DIAGNOSIS — E08621 Diabetes mellitus due to underlying condition with foot ulcer: Secondary | ICD-10-CM

## 2021-04-06 DIAGNOSIS — E11621 Type 2 diabetes mellitus with foot ulcer: Secondary | ICD-10-CM | POA: Diagnosis not present

## 2021-04-06 NOTE — Progress Notes (Signed)
  Subjective:  Patient ID: [name], male    DOB: 07/09/1971,  MRN: [id number]  Chief Complaint  Patient presents with  . Routine Post Op      POV DOS 01/24/2021 & 4/1/2022LT FOOT WOUND DEBRIDEMENT & IRRIGATION W/POSSIBLE PARTIAL 4TH METATARSAL & PROXIMAL PHALANX RESECTION IF INDECATED   50 y.o. male presents for wound care. Hx confirmed with patient. Objective:  Physical Exam: Right 5th MPJ wound with some fibrosis to the wound bed, slight active warmth, erythema. No malodor. No purulence. Sanguinous drainage noted. Sutures intact. Assessment:   1. Diabetic ulcer of left midfoot associated with type 2 diabetes mellitus, with muscle involvement without evidence of necrosis ([facility])   2. Diabetic ulcer of other part of right foot associated with diabetes mellitus due to underlying condition, limited to breakdown of skin [facility])    Plan:  Patient was evaluated and treated and all questions answered.  Ulcer left foot -Healing well. Dressed with maxorb and DSD today. Patient can change as needed  Right foot ulcer -Continues to improve -Continue Cipro  -Dressed with maxorb packed into the wound, followed by DSD.  No follow-ups on file.

## 2021-04-09 ENCOUNTER — Ambulatory Visit (INDEPENDENT_AMBULATORY_CARE_PROVIDER_SITE_OTHER): Payer: No Typology Code available for payment source | Admitting: Podiatry

## 2021-04-09 ENCOUNTER — Other Ambulatory Visit: Payer: Self-pay

## 2021-04-09 DIAGNOSIS — E08621 Diabetes mellitus due to underlying condition with foot ulcer: Secondary | ICD-10-CM

## 2021-04-09 DIAGNOSIS — E11621 Type 2 diabetes mellitus with foot ulcer: Secondary | ICD-10-CM

## 2021-04-09 DIAGNOSIS — L97425 Non-pressure chronic ulcer of left heel and midfoot with muscle involvement without evidence of necrosis: Secondary | ICD-10-CM | POA: Diagnosis not present

## 2021-04-09 DIAGNOSIS — L97511 Non-pressure chronic ulcer of other part of right foot limited to breakdown of skin: Secondary | ICD-10-CM

## 2021-04-09 MED ORDER — CIPROFLOXACIN HCL 500 MG PO TABS
500.0000 mg | ORAL_TABLET | Freq: Two times a day (BID) | ORAL | 0 refills | Status: AC
  Filled 2021-04-09: qty 14, 7d supply, fill #0

## 2021-04-09 NOTE — Progress Notes (Signed)
  Subjective:  Patient ID: [name], male    DOB: September 01, 1971,  MRN: [id number]  Chief Complaint  Patient presents with  . Wound Check    F/U bL wound check -pt states LT remains healed   50 y.o. male presents for wound care. Hx confirmed with patient. Objective:  Physical Exam: Right 5th MPJ wound with some fibrosis to the wound bed, no active warmth or erythema. Mild malodor. No purulence. Sanguinous drainage noted. Sutures intact.   Left foot wound measuring 0.6x1.2 with granular wound base, minimal hyperkeratosis. Retention sutures intact. No warmth, erythema, acute SOI Assessment:   1. Diabetic ulcer of left midfoot associated with type 2 diabetes mellitus, with muscle involvement without evidence of necrosis (HCC)   2. Diabetic ulcer of other part of right foot associated with diabetes mellitus due to underlying condition, limited to breakdown of skin [facility])    Plan:  Patient was evaluated and treated and all questions answered.  Ulcer left foot -Healing well. Apligraf applied today after minimal debridement. GS [id number] Exp 04/20/21. Covered with Mepitel, 4x4, kerlix, ACE bandage. Leave intact x 1 week  Right foot ulcer -Refill cipro -Improving, albeit slowly. Thoroughly cleansed today, scrubbed with chlorhexidine. Minimally debrided. Maxorb packed into the wound. Dressed with 4x4, kerlix, ACE bandage -Discussed with patient may have to debride wound in the OR if wound remains slow to heal or with significant non-viable tissue.  No follow-ups on file.

## 2021-04-10 ENCOUNTER — Other Ambulatory Visit: Payer: Self-pay

## 2021-04-11 ENCOUNTER — Other Ambulatory Visit: Payer: Self-pay

## 2021-04-11 MED FILL — Evolocumab Subcutaneous Soln Auto-Injector 140 MG/ML: SUBCUTANEOUS | 28 days supply | Qty: 2 | Fill #0 | Status: CN

## 2021-04-12 ENCOUNTER — Other Ambulatory Visit: Payer: Self-pay

## 2021-04-12 ENCOUNTER — Ambulatory Visit: Payer: Self-pay | Admitting: Podiatry

## 2021-04-12 ENCOUNTER — Ambulatory Visit (INDEPENDENT_AMBULATORY_CARE_PROVIDER_SITE_OTHER): Payer: No Typology Code available for payment source | Admitting: Podiatry

## 2021-04-12 DIAGNOSIS — L97425 Non-pressure chronic ulcer of left heel and midfoot with muscle involvement without evidence of necrosis: Secondary | ICD-10-CM

## 2021-04-12 DIAGNOSIS — E11621 Type 2 diabetes mellitus with foot ulcer: Secondary | ICD-10-CM

## 2021-04-12 DIAGNOSIS — L97511 Non-pressure chronic ulcer of other part of right foot limited to breakdown of skin: Secondary | ICD-10-CM

## 2021-04-12 DIAGNOSIS — E08621 Diabetes mellitus due to underlying condition with foot ulcer: Secondary | ICD-10-CM

## 2021-04-12 NOTE — H&P (View-Only) (Signed)
  Subjective:  Patient ID: [name], male    DOB: 11/29/1971,  MRN: [id number]  No chief complaint on file.  50 y.o. male presents for wound care. Hx confirmed with patient.  He thinks the wound is doing better has not changed the dressing since last visit. Objective:  Physical Exam: Right 5th MPJ wound with continued fibrosis to the wound bed, no active warmth or erythema.  No malodor. No purulence. Sanguinous drainage noted. Sutures intact.   Left foot wound measuring 0.6x1.2 with granular wound base, minimal hyperkeratosis. Retention sutures intact. No warmth, erythema, acute SOI Assessment:   1. Diabetic ulcer of left midfoot associated with type 2 diabetes mellitus, with muscle involvement without evidence of necrosis (HCC)    Plan:  Patient was evaluated and treated and all questions answered.  Ulcer left foot -Appears to be healing well Mepilex was left intact today a new dry sterile dressing was applied over this  Right foot ulcer -Continues to improve.  Thoroughly cleansed and irrigated.  Maxorb applied to the wound followed by dry sterile dressing. -We did discuss return to the operating room for repeat debridement possible delayed closure.  He may need fifth metatarsal resection along with this.  We discussed the procedure and consent forms were drafted and reviewed.  No follow-ups on file. 

## 2021-04-12 NOTE — Progress Notes (Signed)
  Subjective:  Patient ID: [name], male    DOB: [birth year],  MRN: [id number]  No chief complaint on file.  50 y.o. male presents for wound care. Hx confirmed with patient.  He thinks the wound is doing better has not changed the dressing since last visit. Objective:  Physical Exam: Right 5th MPJ wound with continued fibrosis to the wound bed, no active warmth or erythema.  No malodor. No purulence. Sanguinous drainage noted. Sutures intact.   Left foot wound measuring 0.6x1.2 with granular wound base, minimal hyperkeratosis. Retention sutures intact. No warmth, erythema, acute SOI Assessment:   1. Diabetic ulcer of left midfoot associated with type 2 diabetes mellitus, with muscle involvement without evidence of necrosis [facility])    Plan:  Patient was evaluated and treated and all questions answered.  Ulcer left foot -Appears to be healing well Mepilex was left intact today a new dry sterile dressing was applied over this  Right foot ulcer -Continues to improve.  Thoroughly cleansed and irrigated.  Maxorb applied to the wound followed by dry sterile dressing. -We did discuss return to the operating room for repeat debridement possible delayed closure.  He may need fifth metatarsal resection along with this.  We discussed the procedure and consent forms were drafted and reviewed.  No follow-ups on file.

## 2021-04-16 ENCOUNTER — Other Ambulatory Visit: Payer: Self-pay

## 2021-04-16 ENCOUNTER — Encounter (HOSPITAL_COMMUNITY): Payer: Self-pay | Admitting: Podiatry

## 2021-04-16 NOTE — Progress Notes (Addendum)
COVID Vaccine Completed: NO Date COVID Vaccine completed: COVID vaccine manufacturer: [employer]   PCP - Dr. [name]. LOV: 03/26/21.  H&P in Epic Cardiologist - Dr. Marca Ancona.  Chest x-ray -  EKG - 05/26/20 Stress Test -  ECHO -  Cardiac Cath -  Pacemaker/ICD device last checked:  Sleep Study - NO CPAP - NO  Fasting Blood Sugar - 120's Checks Blood Sugar ___1__ time a week.  Blood Thinner Instructions: Aspirin Instructions: No instructions . Last Dose:  Activity Level:  Currently unable to climb stairs due to foot pain/wound.  Denies chest pain or shortness of breath with ADL's.  Anesthesia review: Hx: [name],HTN,CHF,CAD,MI,CKD.  Patient denies shortness of breath, fever, cough and chest pain at PAT appointment (completed over the phone)   Patient verbalized understanding of instructions that were given to them at the PAT appointment. Patient was also instructed that they will need to review over the PAT instructions again at home before surgery.

## 2021-04-17 ENCOUNTER — Encounter: Payer: Self-pay | Admitting: Podiatry

## 2021-04-17 ENCOUNTER — Telehealth: Payer: Self-pay | Admitting: Podiatry

## 2021-04-17 NOTE — Telephone Encounter (Signed)
Patient called in regarding wound, stated its turning white and would like to know if he should remove bandage, patient stated he would upload images of wound VIA MyChart for Dr.Price to view

## 2021-04-18 ENCOUNTER — Other Ambulatory Visit (HOSPITAL_COMMUNITY)
Admission: RE | Admit: 2021-04-18 | Discharge: 2021-04-18 | Disposition: A | Discharge status: Home / Self Care | Payer: No Typology Code available for payment source | Source: Ambulatory Visit | Attending: Podiatry | Admitting: Podiatry

## 2021-04-18 DIAGNOSIS — Z20822 Contact with and (suspected) exposure to covid-19: Secondary | ICD-10-CM | POA: Diagnosis not present

## 2021-04-18 DIAGNOSIS — Z01812 Encounter for preprocedural laboratory examination: Secondary | ICD-10-CM | POA: Insufficient documentation

## 2021-04-18 LAB — SARS CORONAVIRUS 2 (TAT 6-24 HRS): SARS Coronavirus 2: NEGATIVE

## 2021-04-18 NOTE — Progress Notes (Signed)
Anesthesia Chart Review:  Pt is a same day work up   Case: [id number] Date/Time: 04/20/21 1715   Procedure: DEBRIDEMENT WOUND (Right )   Anesthesia type: Monitor Anesthesia Care   Pre-op diagnosis: Ulcer right foot   Location: [place] [facility] / [facility]   Surgeons: Evelina Bucy, DPM      DISCUSSION: Pt is 50 years old with hx HTN, DM II (last A1C 7.9 on 03/28/21), CKD Stage III, CHF (EF 40-45% on Echo 08/31/2019), CAD (DES to LAD 2012; DES to LCx/OM2 x2 and DES to dRCA/PDA 2017), ischemic cardiomyopathy, osteomyelitis   S/p multiple recent prior bilateral foot surgeries 2021-2022, most recent for R 5th toe resection 03/30/21   VS: Ht $Remo'6\' 6"'RYHEh$  (1.981 m)   Wt 122 kg   BMI 31.08 kg/m   PROVIDERS: - PCP is Elsie Stain, MD who cleared pt for surgery at last office visit 03/26/21 - HF cardiologist is Loralie Champagne, MD. Last office visit 05/26/20.    LABS: Will be obtained day of surgery    EKG 05/26/20: NSR. Inferior infarct, age undetermined. Possible Anterolateral infarct, age undetermined   CV: Echo 08/31/19:  1. The left ventricle has mild-moderately reduced systolic function, with an ejection fraction of 40-45%. The cavity size was normal. Left ventricular diastolic Doppler parameters are consistent with impaired relaxation.  2. The right ventricle has normal systolic function. The cavity was normal. There is no increase in right ventricular wall thickness.  3. Trivial pericardial effusion is present.  4. Mild to moderate aortic annular calcification noted.  5. The tricuspid valve is grossly normal.  6. The aorta is normal unless otherwise noted.  7. The aortic root and ascending aorta are normal in size and structure.  8. The atrial septum is grossly normal.    Coronary stent intervention 10/28/16:   RPDA lesion, 90 %stenosed.  Ost RPDA lesion, 90 %stenosed.  A STENT PROMUS PREM MR 2.25X24 drug eluting stent was successfully placed.  Dist RCA lesion, 90  %stenosed.  Post intervention, there is a 0% residual stenosis. 1. Severe stenosis distal RCA just before the bifurcation into the moderate caliber PDA and small caliber posterolateral branch.  2. Successful PTCA/DES x 1 distal RCA with the single stent extending from the distal RCA into the PDA.    Cardiac cath 10/25/16:   There is severe left ventricular systolic dysfunction by cardiac output.  Hemodynamic findings consistent with severe pulmonary hypertension - likely combined secondary and primary with severely elevated LVEDP.[name]  Severe multivessel CAD with severe ischemic myopathy.  Dist RCA lesion, 80 %stenosed. Ost RPDA lesion, 90 %stenosed - bifurcation lesion (RPL was not involved). RPDA lesion, 80 %stenosed.  Mid LAD to Dist LAD lesion, 40 %stenosed. The mid LAD stent is widely patent.  Culprit lesion: PCI  Prox Cx to Mid Cx lesion, 90 %stenosed -- this leads into Ost 2nd Mrg to 2nd Mrg lesion, 100 %stenosed.  A STENT PROMUS PREM MR 2.5X38 drug eluting stent was successfully placed - from distal OM 2 up to the native circumflex  A STENT PROMUS PREM MR 3.0X20 drug eluting stent was successfully placed into the proximal circumflex, and overlaps previously placed stent.  Post intervention, there is a 0% residual stenosis with 2 overlapping stents.  Lat 2nd Mrg lesion, 99 %stenosed -barely visualize until OM 2 was stented. Too small for PCI  Ost 1st Mrg to 1st Mrg lesion, 100 %stenosed. This appears to be chronically occluded, fills from left to left collaterals.  2 small for PCI     Past Medical History:  Diagnosis Date  . Acute systolic CHF (congestive heart failure) ([place]) 10/28/2016  . CAD (coronary artery disease)    NSTEMI 10/2011: LHC 11/04/11: pLAD 90%, mLAD 60-70%, small D2 sub totally occluded at ostium, small OM1 90% ostial, 90% mid, mOM2 30%, oPL 80%, RCA 30%, dPDA 80%, EF 20% with ant AK.  PCI:  Promus DES to pLAD.  [name] Cellulitis of left foot 06/22/2020  .  Chronic kidney disease    CKD  . Chronic systolic heart failure ([place])   . Diabetic ulcer of left midfoot associated with type 2 diabetes mellitus, with muscle involvement without evidence of necrosis ([place])   . DM2 (diabetes mellitus, type 2) ([place])    type 2  . GERD (gastroesophageal reflux disease)   . HTN (hypertension)   . Hyperlipidemia   . Ischemic cardiomyopathy    Echo 11/03/11: mod LVH, mild focal basal septal hypertrophy, EF 15%, grade 2 diast dysfxn, mild MR, mild to mod LAE, mild RVE, mild to mod reduced RVSF.  EF 3/5 50% by echo  . Myocardial infarction ([place])   . Neuromuscular disorder ([facility])    neuropathy feet  . Obesity   . Ulcer of right foot ([place]) 06/22/2020    Past Surgical History:  Procedure Laterality Date  . AMPUTATION Left 06/23/2020   Procedure: AMPUTATION RAY 5th;  Surgeon: Trula Slade, DPM;  Location: [place];  Service: Podiatry;  Laterality: Left;  . BONE BIOPSY Left 12/20/2020   Procedure: BONE BIOPSY X 3;  Surgeon: Evelina Bucy, DPM;  [facility];  Service: Podiatry;  Laterality: Left;  . CARDIAC CATHETERIZATION N/A 10/25/2016   Procedure: Right/Left Heart Cath and Coronary Angiography;  Surgeon: Leonie Man, MD;  Location: [place] [facility];  Service: Cardiovascular;  Laterality: N/A;  . CARDIAC CATHETERIZATION N/A 10/25/2016   Procedure: Coronary Stent Intervention;  Surgeon: Leonie Man, MD;  Location: [place] [facility];  Service: Cardiovascular;  Laterality: N/A;  . CARDIAC CATHETERIZATION N/A 10/28/2016   Procedure: Coronary Stent Intervention;  Surgeon: Burnell Blanks, MD;  Location: [place] [facility];  Service: Cardiovascular;  Laterality: N/A;  . GRAFT APPLICATION Left 2/59/5638   Procedure: APPLICATION OF SKIN GRAFT USING INTE[employer]RA BILAYER MATRIX WOUND DRESSING;  Surgeon: Evelina Bucy, DPM;  Location: [place];  Service: Podiatry;  Laterality: Left;  . I & D EXTREMITY Left 07/14/2020   Procedure: IRRIGATION AND  DEBRIDEMENT LEFT FOOT;  Surgeon: Evelina Bucy, DPM;  Location: [place];  Service: Podiatry;  Laterality: Left;  . LEFT AND RIGHT HEART CATHETERIZATION WITH CORONARY ANGIOGRAM N/A 11/04/2011   Procedure: LEFT AND RIGHT HEART CATHETERIZATION WITH CORONARY ANGIOGRAM;  Surgeon: Minus Breeding, MD;  Location: [facility] CATH LAB;  Service: Cardiovascular;  Laterality: N/A;  . METATARSAL HEAD EXCISION Left 01/24/2021   Procedure: 4th METATARSAL HEAD EXCISION, Proximal phalanx resection;  Surgeon: Evelina Bucy, DPM;  [facility];  Service: Podiatry;  Laterality: Left;  . METATARSAL HEAD EXCISION Right 03/30/2021   Procedure: PROXIMAL 5TH TOE RESECTION ON RIGHT;  Surgeon: Evelina Bucy, DPM;  [facility];  Service: Podiatry;  Laterality: Right;  . PERCUTANEOUS CORONARY STENT INTERVENTION (PCI-S)  11/04/2011   Procedure: PERCUTANEOUS CORONARY STENT INTERVENTION (PCI-S);  Surgeon: Sherren Mocha, MD;  Location: [facility] CATH LAB;  Service: Cardiovascular;;  . WOUND DEBRIDEMENT Left 06/26/2020   Procedure: LEFT FOOT WOUND DEBRIDEMENT AND GRAFT APPLICATION;  Surgeon: Celesta Gentile  R, DPM;  Location: [place];  Service: Podiatry;  Laterality: Left;  . WOUND DEBRIDEMENT Left 12/20/2020   Procedure: DEBRIDEMENT WOUND AND POSSIBLE CLOSURE;  Surgeon: Evelina Bucy, DPM;  [facility];  Service: Podiatry;  Laterality: Left;  . WOUND DEBRIDEMENT Left 01/24/2021   Procedure: DEBRIDEMENT WOUND;  Surgeon: Evelina Bucy, DPM;  [facility];  Service: Podiatry;  Laterality: Left;  . WOUND DEBRIDEMENT Bilateral 03/30/2021   Procedure: DEBRIDEMENT WOUND WITH SKIN GRAFT SUBSTITUTE APPLICATION;  Surgeon: Evelina Bucy, DPM;  [facility];  Service: Podiatry;  Laterality: Bilateral;    MEDICATIONS: No current facility-administered medications for this encounter.   [name] acetaminophen (TYLENOL) 500 MG tablet  . aspirin 81 MG chewable tablet  . atorvastatin (LIPITOR) 80 MG tablet  . carvedilol (COREG) 6.25 MG  tablet  . ciprofloxacin (CIPRO) 500 MG tablet  . collagenase (SANTYL) ointment  . dapagliflozin propanediol (FARXIGA) 10 MG TABS tablet  . digoxin (LANOXIN) 0.125 MG tablet  . Evolocumab 140 MG/ML SOAJ  . furosemide (LASIX) 40 MG tablet  . glipiZIDE (GLUCOTROL) 10 MG tablet  . insulin glargine (LANTUS) 100 UNIT/ML Solostar Pen  . omeprazole (PRILOSEC) 20 MG capsule  . sacubitril-valsartan (ENTRESTO) 97-103 MG  . spironolactone (ALDACTONE) 50 MG tablet  . Blood Glucose Monitoring Suppl (ONETOUCH VERIO REFLECT) w/Device KIT  . Blood Glucose Monitoring Suppl (ONETOUCH VERIO) w/Device KIT  . clindamycin (CLEOCIN) 300 MG capsule  . Evolocumab 140 MG/ML SOAJ  . Evolocumab 140 MG/ML SOSY  . glucose blood test strip  . Insulin Pen Needle 31G X 5 MM MISC  . Lancets (ONETOUCH DELICA PLUS [id number]) MISC  . OneTouch Delica Lancets 29M MISC  . sildenafil (REVATIO) 20 MG tablet  . silver sulfADIAZINE (SILVADENE) 1 % cream   . vancomycin (VANCOCIN) powder    If labs acceptable day of surgery, I anticipate pt can proceed with surgery as scheduled.  Willeen Cass, PhD, FNP-BC [facility]/Anesthesiology Phone: [phone] 04/18/2021 12:16 PM

## 2021-04-18 NOTE — Anesthesia Preprocedure Evaluation (Addendum)
Anesthesia Evaluation  Patient identified by MRN, date of birth, ID band Patient awake    Reviewed: Allergy & Precautions, NPO status , Patient's Chart, lab work & pertinent test results  Airway Mallampati: II  TM Distance: >3 FB Neck ROM: Full    Dental no notable dental hx.    Pulmonary neg pulmonary ROS,    Pulmonary exam normal breath sounds clear to auscultation       Cardiovascular hypertension, Pt. on medications + CAD, + Past MI and +CHF  Normal cardiovascular exam Rhythm:Regular Rate:Normal     Neuro/Psych negative neurological ROS  negative psych ROS   GI/Hepatic Neg liver ROS, GERD  ,  Endo/Other  negative endocrine ROSdiabetes, Type 2  Renal/GU Renal InsufficiencyRenal disease  negative genitourinary   Musculoskeletal negative musculoskeletal ROS (+)   Abdominal   Peds negative pediatric ROS (+)  Hematology negative hematology ROS (+)   Anesthesia Other Findings   Reproductive/Obstetrics negative OB ROS                            Anesthesia Physical Anesthesia Plan  ASA: III  Anesthesia Plan: MAC   Post-op Pain Management:    Induction: Intravenous  PONV Risk Score and Plan: 1 and Ondansetron and Treatment may vary due to age or medical condition  Airway Management Planned: Simple Face Mask  Additional Equipment:   Intra-op Plan:   Post-operative Plan:   Informed Consent: I have reviewed the patients History and Physical, chart, labs and discussed the procedure including the risks, benefits and alternatives for the proposed anesthesia with the patient or authorized representative who has indicated his/her understanding and acceptance.     Dental advisory given  Plan Discussed with: CRNA  Anesthesia Plan Comments: (See APP note by Durel Salts, FNP )       Anesthesia Quick Evaluation

## 2021-04-19 ENCOUNTER — Other Ambulatory Visit: Payer: Self-pay

## 2021-04-19 ENCOUNTER — Telehealth: Payer: Self-pay | Admitting: Urology

## 2021-04-19 NOTE — Telephone Encounter (Signed)
DOS - 04/20/21  DEBRIDEMENT WOUND RIGHT --- 11043 METATARSECTOMY 5TH RIGHT --- 99833   [employer] EFFECTIVE DATE - 07/30/20   PLAN DEDUCTIBLE - $3,500.00 W/ $0.00 REMAINING OUT OF POCKET - $7,900.00 W/ $0.00 REMAINING COINSURANCE - 20% COPAY - $0.00   PER [employer] WEB SITE FOR CPT CODES 82505 AND [phone] Notification or Prior Authorization is not required for the requested services.  Decision ID #:[id number]

## 2021-04-20 ENCOUNTER — Ambulatory Visit (HOSPITAL_COMMUNITY)
Admission: RE | Admit: 2021-04-20 | Discharge: 2021-04-20 | Disposition: A | Discharge status: Home / Self Care | Payer: No Typology Code available for payment source | Attending: Podiatry | Admitting: Podiatry

## 2021-04-20 ENCOUNTER — Ambulatory Visit (HOSPITAL_COMMUNITY): Payer: No Typology Code available for payment source | Admitting: Emergency Medicine

## 2021-04-20 ENCOUNTER — Encounter (HOSPITAL_COMMUNITY): Payer: Self-pay | Admitting: Podiatry

## 2021-04-20 ENCOUNTER — Encounter (HOSPITAL_COMMUNITY)
Admission: RE | Disposition: A | Discharge status: Home / Self Care | Payer: Self-pay | Source: Home / Self Care | Attending: Podiatry

## 2021-04-20 ENCOUNTER — Encounter: Payer: No Typology Code available for payment source | Admitting: Podiatry

## 2021-04-20 ENCOUNTER — Ambulatory Visit (HOSPITAL_COMMUNITY): Payer: No Typology Code available for payment source

## 2021-04-20 DIAGNOSIS — Z9889 Other specified postprocedural states: Secondary | ICD-10-CM

## 2021-04-20 DIAGNOSIS — L97425 Non-pressure chronic ulcer of left heel and midfoot with muscle involvement without evidence of necrosis: Secondary | ICD-10-CM | POA: Diagnosis not present

## 2021-04-20 DIAGNOSIS — Z794 Long term (current) use of insulin: Secondary | ICD-10-CM | POA: Diagnosis not present

## 2021-04-20 DIAGNOSIS — M869 Osteomyelitis, unspecified: Secondary | ICD-10-CM | POA: Diagnosis not present

## 2021-04-20 DIAGNOSIS — E11621 Type 2 diabetes mellitus with foot ulcer: Secondary | ICD-10-CM

## 2021-04-20 DIAGNOSIS — E1122 Type 2 diabetes mellitus with diabetic chronic kidney disease: Secondary | ICD-10-CM | POA: Diagnosis not present

## 2021-04-20 DIAGNOSIS — I130 Hypertensive heart and chronic kidney disease with heart failure and stage 1 through stage 4 chronic kidney disease, or unspecified chronic kidney disease: Secondary | ICD-10-CM | POA: Diagnosis not present

## 2021-04-20 DIAGNOSIS — E1169 Type 2 diabetes mellitus with other specified complication: Secondary | ICD-10-CM | POA: Insufficient documentation

## 2021-04-20 DIAGNOSIS — I5022 Chronic systolic (congestive) heart failure: Secondary | ICD-10-CM | POA: Diagnosis not present

## 2021-04-20 DIAGNOSIS — Z7984 Long term (current) use of oral hypoglycemic drugs: Secondary | ICD-10-CM | POA: Insufficient documentation

## 2021-04-20 DIAGNOSIS — N183 Chronic kidney disease, stage 3 unspecified: Secondary | ICD-10-CM | POA: Diagnosis not present

## 2021-04-20 DIAGNOSIS — I2510 Atherosclerotic heart disease of native coronary artery without angina pectoris: Secondary | ICD-10-CM | POA: Diagnosis not present

## 2021-04-20 DIAGNOSIS — Z955 Presence of coronary angioplasty implant and graft: Secondary | ICD-10-CM | POA: Diagnosis not present

## 2021-04-20 DIAGNOSIS — L97519 Non-pressure chronic ulcer of other part of right foot with unspecified severity: Secondary | ICD-10-CM | POA: Insufficient documentation

## 2021-04-20 DIAGNOSIS — E08621 Diabetes mellitus due to underlying condition with foot ulcer: Secondary | ICD-10-CM

## 2021-04-20 DIAGNOSIS — Z7982 Long term (current) use of aspirin: Secondary | ICD-10-CM | POA: Insufficient documentation

## 2021-04-20 DIAGNOSIS — Z79899 Other long term (current) drug therapy: Secondary | ICD-10-CM | POA: Insufficient documentation

## 2021-04-20 DIAGNOSIS — I255 Ischemic cardiomyopathy: Secondary | ICD-10-CM | POA: Insufficient documentation

## 2021-04-20 HISTORY — PX: WOUND DEBRIDEMENT: SHX247

## 2021-04-20 LAB — CBC
HCT: 37.6 % — ABNORMAL LOW (ref 39.0–52.0)
Hemoglobin: 12.1 g/dL — ABNORMAL LOW (ref 13.0–17.0)
MCH: 25.9 pg — ABNORMAL LOW (ref 26.0–34.0)
MCHC: 32.2 g/dL (ref 30.0–36.0)
MCV: 80.5 fL (ref 80.0–100.0)
Platelets: 293 10*3/uL (ref 150–400)
RBC: 4.67 MIL/uL (ref 4.22–5.81)
RDW: 13.8 % (ref 11.5–15.5)
WBC: 11.1 10*3/uL — ABNORMAL HIGH (ref 4.0–10.5)
nRBC: 0 % (ref 0.0–0.2)

## 2021-04-20 LAB — BASIC METABOLIC PANEL
Anion gap: 10 (ref 5–15)
BUN: 28 mg/dL — ABNORMAL HIGH (ref 6–20)
CO2: 23 mmol/L (ref 22–32)
Calcium: 9.1 mg/dL (ref 8.9–10.3)
Chloride: 103 mmol/L (ref 98–111)
Creatinine, Ser: 1.19 mg/dL (ref 0.61–1.24)
GFR, Estimated: >60 mL/min (ref >60)
Glucose, Bld: 98 mg/dL (ref 70–99)
Potassium: 4.2 mmol/L (ref 3.5–5.1)
Sodium: 136 mmol/L (ref 135–145)

## 2021-04-20 LAB — GLUCOSE, CAPILLARY
Glucose-Capillary: 95 mg/dL (ref 70–99)
Glucose-Capillary: 72 mg/dL (ref 70–99)

## 2021-04-20 SURGERY — DEBRIDEMENT, WOUND
Anesthesia: Monitor Anesthesia Care | Laterality: Right

## 2021-04-20 MED ORDER — FENTANYL CITRATE (PF) 100 MCG/2ML IJ SOLN
INTRAMUSCULAR | Status: AC
  Filled 2021-04-20: qty 2

## 2021-04-20 MED ORDER — CIPROFLOXACIN HCL 500 MG PO TABS
500.0000 mg | ORAL_TABLET | Freq: Two times a day (BID) | ORAL | 0 refills | Status: DC
  Filled 2021-04-20: qty 14, 7d supply, fill #0

## 2021-04-20 MED ORDER — FENTANYL CITRATE (PF) 100 MCG/2ML IJ SOLN
INTRAMUSCULAR | Status: DC | PRN
  Administered 2021-04-20: 50 ug via INTRAVENOUS
  Administered 2021-04-20 (×2): 25 ug via INTRAVENOUS

## 2021-04-20 MED ORDER — FENTANYL CITRATE (PF) 100 MCG/2ML IJ SOLN: 50.0000 ug | Freq: Once | INTRAMUSCULAR | Status: DC

## 2021-04-20 MED ORDER — MIDAZOLAM HCL 5 MG/5ML IJ SOLN
INTRAMUSCULAR | Status: DC | PRN
  Administered 2021-04-20: 2 mg via INTRAVENOUS

## 2021-04-20 MED ORDER — OXYCODONE-ACETAMINOPHEN 5-325 MG PO TABS
1.0000 | ORAL_TABLET | ORAL | 0 refills | Status: DC | PRN
  Filled 2021-04-20: qty 20, 4d supply, fill #0

## 2021-04-20 MED ORDER — ONDANSETRON HCL 4 MG/2ML IJ SOLN: 4.0000 mg | Freq: Once | INTRAMUSCULAR | Status: DC

## 2021-04-20 MED ORDER — CHLORHEXIDINE GLUCONATE 0.12 % MT SOLN
15.0000 mL | Freq: Once | OROMUCOSAL | Status: AC
  Administered 2021-04-20: 15 mL via OROMUCOSAL

## 2021-04-20 MED ORDER — SODIUM CHLORIDE 0.9 % IR SOLN
Status: DC | PRN
  Administered 2021-04-20: 3000 mL

## 2021-04-20 MED ORDER — PROPOFOL 1000 MG/100ML IV EMUL
INTRAVENOUS | Status: AC
  Filled 2021-04-20: qty 100

## 2021-04-20 MED ORDER — BUPIVACAINE HCL (PF) 0.5 % IJ SOLN
INTRAMUSCULAR | Status: DC | PRN
  Administered 2021-04-20: 20 mL

## 2021-04-20 MED ORDER — OXYCODONE HCL 5 MG/5ML PO SOLN: 5.0000 mg | Freq: Once | ORAL | Status: DC | PRN

## 2021-04-20 MED ORDER — MIDAZOLAM HCL 2 MG/2ML IJ SOLN
INTRAMUSCULAR | Status: AC
  Filled 2021-04-20: qty 2

## 2021-04-20 MED ORDER — LACTATED RINGERS IV SOLN: INTRAVENOUS | Status: DC

## 2021-04-20 MED ORDER — BUPIVACAINE HCL (PF) 0.5 % IJ SOLN
INTRAMUSCULAR | Status: AC
  Filled 2021-04-20: qty 30

## 2021-04-20 MED ORDER — CEFAZOLIN IN SODIUM CHLORIDE 3-0.9 GM/100ML-% IV SOLN
3.0000 g | INTRAVENOUS | Status: AC
  Administered 2021-04-20: 3 g via INTRAVENOUS
  Filled 2021-04-20: qty 100

## 2021-04-20 MED ORDER — PROMETHAZINE HCL 25 MG/ML IJ SOLN: 6.2500 mg | INTRAMUSCULAR | Status: DC | PRN

## 2021-04-20 MED ORDER — ORAL CARE MOUTH RINSE: 15.0000 mL | Freq: Once | OROMUCOSAL | Status: AC

## 2021-04-20 MED ORDER — ONDANSETRON HCL 4 MG/2ML IJ SOLN
INTRAMUSCULAR | Status: DC | PRN
  Administered 2021-04-20: 4 mg via INTRAVENOUS

## 2021-04-20 MED ORDER — OXYCODONE HCL 5 MG PO TABS
5.0000 mg | ORAL_TABLET | Freq: Once | ORAL | Status: DC | PRN
Start: 2021-04-20 — End: 2021-04-21

## 2021-04-20 MED ORDER — ONDANSETRON HCL 4 MG/2ML IJ SOLN
INTRAMUSCULAR | Status: AC
  Filled 2021-04-20: qty 2

## 2021-04-20 MED ORDER — HYDROMORPHONE HCL 1 MG/ML IJ SOLN: 0.2500 mg | INTRAMUSCULAR | Status: DC | PRN

## 2021-04-20 SURGICAL SUPPLY — 69 items
APL PRP STRL LF DISP 70% ISPRP (MISCELLANEOUS) ×1
BLADE HEX COATED 2.75 (ELECTRODE) ×2 IMPLANT
BLADE MIC 41X13 (BLADE) ×2 IMPLANT
BLADE OSCILLATING/SAGITTAL (BLADE)
BLADE PRESCISION 7.0X.51X18.5 (BLADE) ×2 IMPLANT
BLADE SURG 15 STRL LF DISP TIS (BLADE) ×1 IMPLANT
BLADE SURG 15 STRL SS (BLADE) ×2
BLADE SW THK.38XMED LNG THN (BLADE) IMPLANT
BNDG CMPR 9X4 STRL LF SNTH (GAUZE/BANDAGES/DRESSINGS) ×1
BNDG ELASTIC 3X5.8 VLCR STR LF (GAUZE/BANDAGES/DRESSINGS) ×2 IMPLANT
BNDG ELASTIC 4X5.8 VLCR STR LF (GAUZE/BANDAGES/DRESSINGS) ×2 IMPLANT
BNDG ESMARK 4X9 LF (GAUZE/BANDAGES/DRESSINGS) ×2 IMPLANT
BNDG GAUZE ELAST 4 BULKY (GAUZE/BANDAGES/DRESSINGS) ×2 IMPLANT
CHLORAPREP W/TINT 26 (MISCELLANEOUS) ×2 IMPLANT
CNTNR URN SCR LID CUP LEK RST (MISCELLANEOUS) ×2 IMPLANT
CONT SPEC 4OZ STRL OR WHT (MISCELLANEOUS) ×4
COVER BACK TABLE 60X90IN (DRAPES) ×2 IMPLANT
COVER WAND RF STERILE (DRAPES) IMPLANT
CUFF TOURN SGL QUICK 18X4 (TOURNIQUET CUFF) IMPLANT
DRAPE EXTREMITY T 121X128X90 (DISPOSABLE) ×2 IMPLANT
DRAPE IMP U-DRAPE 54X76 (DRAPES) ×2 IMPLANT
DRAPE U-SHAPE 47X51 STRL (DRAPES) ×2 IMPLANT
DRSG EMULSION OIL 3X3 NADH (GAUZE/BANDAGES/DRESSINGS) ×2 IMPLANT
DRSG PAD ABDOMINAL 8X10 ST (GAUZE/BANDAGES/DRESSINGS) IMPLANT
ELECT REM PT RETURN 15FT ADLT (MISCELLANEOUS) ×2 IMPLANT
GAUZE 4X4 16PLY RFD (DISPOSABLE) IMPLANT
GAUZE SPONGE 4X4 12PLY STRL (GAUZE/BANDAGES/DRESSINGS) ×2 IMPLANT
GAUZE XEROFORM 1X8 LF (GAUZE/BANDAGES/DRESSINGS) ×2 IMPLANT
GAUZE XEROFORM 5X9 LF (GAUZE/BANDAGES/DRESSINGS) IMPLANT
GLOVE SRG 8 PF TXTR STRL LF DI (GLOVE) ×1 IMPLANT
GLOVE SURG ENC MOIS LTX SZ7.5 (GLOVE) ×2 IMPLANT
GLOVE SURG LTX SZ8 (GLOVE) ×2 IMPLANT
GLOVE SURG UNDER POLY LF SZ8 (GLOVE) ×2
GOWN STRL REUS W/ TWL LRG LVL3 (GOWN DISPOSABLE) ×1 IMPLANT
GOWN STRL REUS W/TWL LRG LVL3 (GOWN DISPOSABLE) ×2
GOWN STRL REUS W/TWL XL LVL3 (GOWN DISPOSABLE) ×4 IMPLANT
KIT BASIN OR (CUSTOM PROCEDURE TRAY) ×2 IMPLANT
MANIFOLD NEPTUNE II (INSTRUMENTS) ×2 IMPLANT
NEEDLE HYPO 25X1 1.5 SAFETY (NEEDLE) ×4 IMPLANT
NS IRRIG 1000ML POUR BTL (IV SOLUTION) IMPLANT
PACK ORTHO EXTREMITY (CUSTOM PROCEDURE TRAY) ×2 IMPLANT
PADDING CAST ABS 4INX4YD NS (CAST SUPPLIES) ×1
PADDING CAST ABS COTTON 4X4 ST (CAST SUPPLIES) ×1 IMPLANT
PENCIL SMOKE EVACUATOR (MISCELLANEOUS) IMPLANT
SET IRRIG Y TYPE TUR BLADDER L (SET/KITS/TRAYS/PACK) IMPLANT
SLEEVE SCD COMPRESS KNEE MED (STOCKING) ×2 IMPLANT
SPONGE SURGIFOAM ABS GEL 100 (HEMOSTASIS) IMPLANT
STAPLER VISISTAT 35W (STAPLE) ×2 IMPLANT
STOCKINETTE 8 INCH (MISCELLANEOUS) ×4 IMPLANT
SUCTION FRAZIER HANDLE 12FR (TUBING) ×2
SUCTION TUBE FRAZIER 12FR DISP (TUBING) ×1 IMPLANT
SUT ETHILON 3 0 PS 1 (SUTURE) ×2 IMPLANT
SUT ETHILON 4 0 PS 2 18 (SUTURE) ×2 IMPLANT
SUT MNCRL AB 3-0 PS2 18 (SUTURE) IMPLANT
SUT MNCRL AB 4-0 PS2 18 (SUTURE) IMPLANT
SUT MON AB 5-0 PS2 18 (SUTURE) IMPLANT
SUT VIC AB 3-0 FS2 27 (SUTURE) ×2 IMPLANT
SUT VIC AB 3-0 SH 27 (SUTURE) ×2
SUT VIC AB 3-0 SH 27XBRD (SUTURE) ×1 IMPLANT
SUT VIC AB 4-0 PS2 18 (SUTURE) ×2 IMPLANT
SWAB COLLECTION DEVICE MRSA (MISCELLANEOUS) ×2 IMPLANT
SWAB CULTURE ESWAB REG 1ML (MISCELLANEOUS) ×2 IMPLANT
SYR 10ML LL (SYRINGE) ×2 IMPLANT
SYR BULB EAR ULCER 3OZ GRN STR (SYRINGE) IMPLANT
SYR CONTROL 10ML LL (SYRINGE) ×2 IMPLANT
TOWEL OR 17X26 10 PK STRL BLUE (TOWEL DISPOSABLE) ×2 IMPLANT
TUBING CONNECTING 10 (TUBING) ×2 IMPLANT
UNDERPAD 30X36 HEAVY ABSORB (UNDERPADS AND DIAPERS) IMPLANT
YANKAUER SUCT BULB TIP NO VENT (SUCTIONS) ×2 IMPLANT

## 2021-04-20 NOTE — Transfer of Care (Signed)
Immediate Anesthesia Transfer of Care Note  Patient: [name]  Procedure(s) Performed: Cresenciano Lick FOOT (Right )  Patient Location: PACU  Anesthesia Type:MAC  Level of Consciousness: awake, alert  and oriented  Airway & Oxygen Therapy: Patient Spontanous Breathing and Patient connected to face mask oxygen  Post-op Assessment: Report given to RN and Post -op Vital signs reviewed and stable  Post vital signs: Reviewed and stable  Last Vitals:  Vitals Value Taken Time  BP 138/97 04/20/21 1851  Temp    Pulse 80 04/20/21 1852  Resp 14 04/20/21 1852  SpO2 100 % 04/20/21 1852  Vitals shown include unvalidated device data.  Last Pain:  Vitals:   04/20/21 1552  TempSrc:   PainSc: 2          Complications: No complications documented.

## 2021-04-20 NOTE — Interval H&P Note (Signed)
History and Physical Interval Note:  04/20/2021 5:23 PM  [name]  has presented today for surgery, with the diagnosis of Ulcer right foot.  The various methods of treatment have been discussed with the patient and family. After consideration of risks, benefits and other options for treatment, the patient has consented to  Procedure(s): DEBRIDEMENT WOUND (Right) as a surgical intervention.  The patient's history has been reviewed, patient examined, no change in status, stable for surgery.  I have reviewed the patient's chart and labs.  Questions were answered to the patient's satisfaction.     Park Liter

## 2021-04-20 NOTE — Anesthesia Postprocedure Evaluation (Signed)
Anesthesia Post Note  Patient: [name]  Procedure(s) Performed: Cresenciano Lick FOOT (Right )     Patient location during evaluation: PACU Anesthesia Type: MAC Level of consciousness: awake and alert Pain management: pain level controlled Vital Signs Assessment: post-procedure vital signs reviewed and stable Respiratory status: spontaneous breathing, nonlabored ventilation and respiratory function stable Cardiovascular status: blood pressure returned to baseline and stable Postop Assessment: no apparent nausea or vomiting Anesthetic complications: no   No complications documented.  Last Vitals:  Vitals:   04/20/21 1905 04/20/21 1915  BP:  (!) 135/98  Pulse:    Resp: (!) 21 18  Temp:  (!) 36.4 C  SpO2:  100%    Last Pain:  Vitals:   04/20/21 1915  TempSrc:   PainSc: 0-No pain                 Lynda Rainwater

## 2021-04-20 NOTE — Discharge Instructions (Signed)
  After Surgery Instructions   1) If you are recuperating from surgery anywhere other than home, please be sure to leave us the number where you can be reached.  2) Go directly home and rest.  3) Keep the operated foot(feet) elevated six inches above the hip when sitting or lying down. This will help control swelling and pain.  4) Support the elevated foot and leg with pillows. DO NOT PLACE PILLOWS UNDER THE KNEE.  5) DO NOT REMOVE or get your bandages WET, unless you were given different instructions by your doctor to do so. This increases the risk of infection.  6) Wear your surgical shoe or surgical boot at all times when you are up on your feet.  7) A limited amount of pain and swelling may occur. The skin may take on a bruised appearance. DO NOT BE ALARMED, THIS IS NORMAL.  8) For slight pain and swelling, apply an ice pack directly over the bandages for 15 minutes only out of each hour of the day. Continue until seen in the office for your first post op visit. DO NOT APPLY ANY FORM OF HEAT TO THE AREA.  9) Have prescriptions filled immediately and take as directed.  10) Drink lots of liquids, water and juice to stay hydrated.  11) CALL IMMEDIATELY IF:  *Bleeding continues until the following day of surgery  *Pain increases and/or does not respond to medication  *Bandages or cast appears to tight  *If your bandage gets wet  *Trip, fall or stump your surgical foot  *If your temperature goes above 101  *If you have ANY questions at all  12) You are expected to be weightbearing after your surgery.   If you need to reach the nurse for any reason, please call: [place]: [phone] [place]: [phone] [place]: [phone]  

## 2021-04-20 NOTE — Op Note (Signed)
  Patient Name: [name] DOB: [birth year]  MRN: [id number]   Date of Surgery: 04/20/2021  Surgeon: Dr. Hardie Pulley, DPM Assistants: none  Pre-operative Diagnosis:  Right foot ulceration, osteomyelitis Post-operative Diagnosis:  Same Procedures:  1) Delayed wound closure right foot  2) Bone biopsy right 5th metatarsal Pathology/Specimens: ID Type Source Tests Collected by Time Destination  A : right foot wound Wound Foot, Right FUNGUS CULTURE WITH STAIN, AEROBIC/ANAEROBIC CULTURE W GRAM STAIN (SURGICAL/DEEP WOUND) Evelina Bucy, DPM 04/20/2021 1815   B : right bone of 5th metatarsal,ink side is distal,asses for clean margins Tissue Foot, Right FUNGUS CULTURE WITH STAIN, AEROBIC/ANAEROBIC CULTURE W GRAM STAIN (SURGICAL/DEEP WOUND) Evelina Bucy, DPM 04/20/2021 1819   C : right bone of 5th metatarsal Tissue Foot, Right FUNGUS CULTURE WITH STAIN, AEROBIC/ANAEROBIC CULTURE W Lonell Grandchild STAIN (SURGICAL/DEEP WOUND) Evelina Bucy, DPM 04/20/2021 1822    Anesthesia: MAC Hemostasis: anatomic Estimated Blood Loss: 25 ml Materials: none Medications: none Complications: none  Indications for Procedure:  This is a 49 y.o. male with osteomyelitis of the right 5th metatrsal. He presents for wound debridement and possible closure   Procedure in Detail: Patient was identified in pre-operative holding area. Formal consent was signed and the right lower extremity was marked. Patient was brought back to the operating room. Anesthesia was induced. The extremity was prepped and draped in the usual sterile fashion. Timeout was taken to confirm patient name, laterality, and procedure prior to incision.   Attention was then directed to the right foot. There was a wound measuring 2x1 with central necrosis. The necrosis was sharply excisionally debrided. Debridement was carried down to bone. The 5th metatarsal was exposed by lengthening the incision and appeared devitalized at its most distal aspect.  The bone was resected to bleeding firm bone. An additional segment of bone past this was excised and inked distally. This was sent for clean margin pathology.  The wound was then copiously irrigated with 3L via pulse lavage. The wound was further debrided to bleeding viable wound base. Final debridement was carried down to bone.  The wound was then re-approximated with 3-0 monocryl, 3-0 nylon and skin staples.  The foot was then dressed with xeroform, 4x4, kerlix, ACE. Patient tolerated the procedure well.  Disposition: Following a period of post-operative monitoring, patient will be transferred home.

## 2021-04-20 NOTE — Brief Op Note (Signed)
04/20/2021  6:56 PM  PATIENT:  [name]  50 y.o. male  PRE-OPERATIVE DIAGNOSIS:  Ulcer right foot  POST-OPERATIVE DIAGNOSIS:  Ulcer right foot  PROCEDURE:  Procedure(s): DEBRIDEMENT WOUND,RIGHT FOOT (Right)  SURGEON:  Surgeon(s) and Role:    * Evelina Bucy, DPM - Primary  PHYSICIAN ASSISTANT:   ASSISTANTS: none   ANESTHESIA:   local and MAC  EBL:  40 ml   BLOOD ADMINISTERED:none  DRAINS: none   LOCAL MEDICATIONS USED:  MARCAINE    and Amount: 20 ml  SPECIMEN:   ID Type Source Tests Collected by Time Destination  A : right foot wound Wound Foot, Right FUNGUS CULTURE WITH STAIN, AEROBIC/ANAEROBIC CULTURE W GRAM STAIN (SURGICAL/DEEP WOUND) Evelina Bucy, DPM 04/20/2021 1815   B : right bone of 5th metatarsal,ink side is distal,asses for clean margins Tissue Foot, Right FUNGUS CULTURE WITH STAIN, AEROBIC/ANAEROBIC CULTURE W GRAM STAIN (SURGICAL/DEEP WOUND) Evelina Bucy, DPM 04/20/2021 1819   C : right bone of 5th metatarsal Tissue Foot, Right FUNGUS CULTURE WITH STAIN, AEROBIC/ANAEROBIC CULTURE W GRAM STAIN (SURGICAL/DEEP WOUND) Evelina Bucy, DPM 04/20/2021 1822       DISPOSITION OF SPECIMEN:  As above  COUNTS:  YES  TOURNIQUET:  * Missing tourniquet times found for documented tourniquets in log: [id number] *  DICTATION: .Viviann Spare Dictation  PLAN OF CARE: Discharge to home after PACU  PATIENT DISPOSITION:  PACU - hemodynamically stable.   Delay start of Pharmacological VTE agent (>24hrs) due to surgical blood loss or risk of bleeding: not applicable

## 2021-04-21 ENCOUNTER — Other Ambulatory Visit (HOSPITAL_COMMUNITY): Payer: Self-pay

## 2021-04-21 ENCOUNTER — Encounter (HOSPITAL_COMMUNITY): Payer: Self-pay | Admitting: Podiatry

## 2021-04-23 ENCOUNTER — Other Ambulatory Visit: Payer: Self-pay

## 2021-04-24 ENCOUNTER — Other Ambulatory Visit: Payer: Self-pay | Admitting: Podiatry

## 2021-04-24 DIAGNOSIS — M86272 Subacute osteomyelitis, left ankle and foot: Secondary | ICD-10-CM

## 2021-04-24 DIAGNOSIS — M86671 Other chronic osteomyelitis, right ankle and foot: Secondary | ICD-10-CM

## 2021-04-24 NOTE — Progress Notes (Signed)
Pathology order placed as addendum from surgery

## 2021-04-25 LAB — AEROBIC/ANAEROBIC CULTURE W GRAM STAIN (SURGICAL/DEEP WOUND)

## 2021-04-25 LAB — SURGICAL PATHOLOGY

## 2021-04-26 LAB — AEROBIC/ANAEROBIC CULTURE W GRAM STAIN (SURGICAL/DEEP WOUND): Gram Stain: NONE SEEN

## 2021-04-27 ENCOUNTER — Encounter (HOSPITAL_COMMUNITY): Payer: Self-pay | Admitting: Cardiology

## 2021-04-27 ENCOUNTER — Other Ambulatory Visit: Payer: Self-pay

## 2021-04-27 ENCOUNTER — Ambulatory Visit (INDEPENDENT_AMBULATORY_CARE_PROVIDER_SITE_OTHER): Payer: No Typology Code available for payment source | Admitting: Podiatry

## 2021-04-27 ENCOUNTER — Ambulatory Visit (HOSPITAL_COMMUNITY)
Admission: RE | Admit: 2021-04-27 | Discharge: 2021-04-27 | Disposition: A | Discharge status: Home / Self Care | Payer: No Typology Code available for payment source | Source: Ambulatory Visit | Attending: Cardiology | Admitting: Cardiology

## 2021-04-27 VITALS — BP 90/50 | HR 88 | Wt 255.4 lb

## 2021-04-27 DIAGNOSIS — Z6829 Body mass index (BMI) 29.0-29.9, adult: Secondary | ICD-10-CM | POA: Insufficient documentation

## 2021-04-27 DIAGNOSIS — I252 Old myocardial infarction: Secondary | ICD-10-CM | POA: Diagnosis not present

## 2021-04-27 DIAGNOSIS — Z792 Long term (current) use of antibiotics: Secondary | ICD-10-CM | POA: Insufficient documentation

## 2021-04-27 DIAGNOSIS — I255 Ischemic cardiomyopathy: Secondary | ICD-10-CM | POA: Diagnosis not present

## 2021-04-27 DIAGNOSIS — E669 Obesity, unspecified: Secondary | ICD-10-CM | POA: Insufficient documentation

## 2021-04-27 DIAGNOSIS — Z955 Presence of coronary angioplasty implant and graft: Secondary | ICD-10-CM | POA: Insufficient documentation

## 2021-04-27 DIAGNOSIS — G4719 Other hypersomnia: Secondary | ICD-10-CM | POA: Diagnosis not present

## 2021-04-27 DIAGNOSIS — I110 Hypertensive heart disease with heart failure: Secondary | ICD-10-CM | POA: Insufficient documentation

## 2021-04-27 DIAGNOSIS — Z7982 Long term (current) use of aspirin: Secondary | ICD-10-CM | POA: Diagnosis not present

## 2021-04-27 DIAGNOSIS — Z794 Long term (current) use of insulin: Secondary | ICD-10-CM | POA: Insufficient documentation

## 2021-04-27 DIAGNOSIS — L97421 Non-pressure chronic ulcer of left heel and midfoot limited to breakdown of skin: Secondary | ICD-10-CM

## 2021-04-27 DIAGNOSIS — I2510 Atherosclerotic heart disease of native coronary artery without angina pectoris: Secondary | ICD-10-CM | POA: Diagnosis not present

## 2021-04-27 DIAGNOSIS — Z79899 Other long term (current) drug therapy: Secondary | ICD-10-CM | POA: Diagnosis not present

## 2021-04-27 DIAGNOSIS — I5022 Chronic systolic (congestive) heart failure: Secondary | ICD-10-CM | POA: Insufficient documentation

## 2021-04-27 DIAGNOSIS — E11621 Type 2 diabetes mellitus with foot ulcer: Secondary | ICD-10-CM | POA: Diagnosis not present

## 2021-04-27 DIAGNOSIS — Z7984 Long term (current) use of oral hypoglycemic drugs: Secondary | ICD-10-CM | POA: Insufficient documentation

## 2021-04-27 DIAGNOSIS — M86272 Subacute osteomyelitis, left ankle and foot: Secondary | ICD-10-CM

## 2021-04-27 DIAGNOSIS — I5042 Chronic combined systolic (congestive) and diastolic (congestive) heart failure: Secondary | ICD-10-CM | POA: Diagnosis not present

## 2021-04-27 LAB — DIGOXIN LEVEL: Digoxin Level: 0.9 ng/mL (ref 0.8–2.0)

## 2021-04-27 MED ORDER — AMOXICILLIN-POT CLAVULANATE 875-125 MG PO TABS
1.0000 | ORAL_TABLET | Freq: Two times a day (BID) | ORAL | 0 refills | Status: DC
  Filled 2021-04-27: qty 20, 10d supply, fill #0

## 2021-04-27 MED FILL — Evolocumab Subcutaneous Soln Auto-Injector 140 MG/ML: SUBCUTANEOUS | 28 days supply | Qty: 2 | Fill #0 | Status: AC

## 2021-04-27 MED FILL — Insulin Glargine Soln Pen-Injector 100 Unit/ML: SUBCUTANEOUS | 30 days supply | Qty: 6 | Fill #0 | Status: AC

## 2021-04-27 MED FILL — Atorvastatin Calcium Tab 80 MG (Base Equivalent): ORAL | 30 days supply | Qty: 30 | Fill #0 | Status: AC

## 2021-04-27 MED FILL — Omeprazole Cap Delayed Release 20 MG: ORAL | 30 days supply | Qty: 30 | Fill #0 | Status: AC

## 2021-04-27 NOTE — Patient Instructions (Signed)
Labs done today, your results will be available in MyChart, we will contact you for abnormal readings.  Your physician has requested that you have an echocardiogram. Echocardiography is a painless test that uses sound waves to create images of your heart. It provides your doctor with information about the size and shape of your heart and how well your heart's chambers and valves are working. This procedure takes approximately one hour. There are no restrictions for this procedure.  Your physician has requested that you have a lower extremity arterial exercise duplex. During this test, exercise and ultrasound are used to evaluate arterial blood flow in the legs. Allow one hour for this exam. There are no restrictions or special instructions.  Your provider has recommended that you have a home sleep study.  We have provided you with the equipment in our office today. Please download the app and follow the instructions. YOUR PIN NUMBER IS: [id number]. Once you have completed the test you just dispose of the equipment, the information is automatically uploaded to [place] via blue-tooth technology. If your test is positive for sleep apnea and you need a home CPAP machine you will be contacted by Dr Norris Cross office [facility]) to set this up.  Your physician recommends that you schedule a follow-up appointment in: 4 months  If you have any questions or concerns before your next appointment please send [place] a message through [place] or call our office at [phone].    TO LEAVE A MESSAGE FOR THE NURSE SELECT OPTION 2, PLEASE LEAVE A MESSAGE INCLUDING: . YOUR NAME . DATE OF BIRTH . CALL BACK NUMBER . REASON FOR CALL**this is important as we prioritize the call backs  YOU WILL RECEIVE A CALL BACK THE SAME DAY AS LONG AS YOU CALL BEFORE 4:00 PM  At the [facility], you and your health needs are our priority. As part of our continuing mission to provide you with exceptional heart care, we have  created designated Provider Care Teams. These Care Teams include your primary Cardiologist (physician) and Advanced Practice Providers (APPs- Physician Assistants and Nurse Practitioners) who all work together to provide you with the care you need, when you need it.   You may see any of the following providers on your designated Care Team at your next follow up: [name] Dr Arvilla Meres . Dr Marca Ancona . Dr Thornell Mule . Tonye Becket, NP . Robbie Lis, PA . Shanda Bumps [place],NP . Karle Plumber, PharmD   Please be sure to bring in all your medications bottles to every appointment.

## 2021-04-27 NOTE — Progress Notes (Signed)
Height: 5'6"    Weight: 255 lb BMI: 29.51  Today's Date: 04/27/21  STOP BANG RISK ASSESSMENT S (snore) Have you been told that you snore?     YES   T (tired) Are you often tired, fatigued, or sleepy during the day?   YES  O (obstruction) Do you stop breathing, choke, or gasp during sleep? NO   P (pressure) Do you have or are you being treated for high blood pressure? YES   B (BMI) Is your body index greater than 35 kg/m? NO   A (age) Are you 48 years old or older? NO   N (neck) Do you have a neck circumference greater than 16 inches?      G (gender) Are you a male? YES   TOTAL STOP/BANG "YES" ANSWERS 4                                                                       For Office Use Only              Procedure Order Form    YES to 3+ Stop Bang questions OR two clinical symptoms - patient qualifies for WatchPAT (CPT 95800)      Clinical Notes: Will consult Sleep Specialist and refer for management of therapy due to patient increased risk of Sleep Apnea. Ordering a sleep study due to the following two clinical symptoms: Excessive daytime sleepiness G47.10 / Loud snoring R06.83

## 2021-04-27 NOTE — Progress Notes (Signed)
  Subjective:  Patient ID: [name], male    DOB: 11/22/1971,  MRN: [id number]  Chief Complaint  Patient presents with  . Routine Post Op    post-op #1 DOS 04/20/21 wound debridement right and closure; Denies any signs of infection; Some yellow and bloody drainage; Manageable pain lvls   DOS: 04/20/21 Procedure: Wound debridement right with closure and metatarsal resection  50 y.o. male presents with the above complaint. History confirmed with patient.   Objective:  Physical Exam: tenderness at the surgical site, local edema noted and calf supple, nontender. Incision: healing well, slight clear drainage present right. Left side appears healed left.  Assessment:   1. Subacute osteomyelitis of left foot (HCC)    Plan:  Patient was evaluated and treated and all questions answered.  Post-operative State -Wound dressed with betadine WTD. Leave intact until f/u -Reviewed cultures/path. Bone margin with osteo. Cultures with prevotella and strep agalactiae. Previous culture with e.coli and s. aureus. Should be treatable with Augmentin. Plan for 4 weeks of therapy. -WBAT in Surgical shoe   Left foot wound -Appears healed. Dressed with SSD and mepilex. Patient to do so daily.  No follow-ups on file.

## 2021-04-29 ENCOUNTER — Encounter (HOSPITAL_BASED_OUTPATIENT_CLINIC_OR_DEPARTMENT_OTHER): Payer: No Typology Code available for payment source | Admitting: Cardiology

## 2021-04-29 DIAGNOSIS — G4733 Obstructive sleep apnea (adult) (pediatric): Secondary | ICD-10-CM | POA: Diagnosis not present

## 2021-04-29 NOTE — Progress Notes (Signed)
Advanced Heart Failure Clinic Note  PCP: Dr. Jarold Song Cardiology: Dr. Percival Spanish HF Cardiology: Dr. Loretta Plume Lesser is a 50 y.o. male with history of CAD and ischemic cardiomyopathy.  He initially had NSTEMI in 10/12 with DES to proximal LAD. At that time, EF was noted to be down to 15%.  Over time, EF improved to 50% (3/13 echo).    Echo in 10/17 showed EF back down to 10% with severe functional MR.  He was taken for cardiac cath.  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  He had DES x 2 to LCx/OM2 and had a staged procedure with DES to distal RCA/ostial PDA.  RHC showed elevated filling pressure and low cardiac output.  While in the hospital, he was on milrinone gtt and was diuresed.  Eventually, we were able to titrate him off milrinone and discharged him home on a po regimen.   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   Echo in 8/20 showed EF in the 35% range with trivial MR.    He had amputation of left 5th toe due to osteomyelitis.   Patient returns for followup of CHF and CAD.  He now has a diabetic foot ulcer on the right, this was debrided in 4/22.  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  He has lost 23 lbs since last appointment with aggressive dieting.  No exertional dyspnea or chest pain but has not been very active due to problems with his feet.  No orthopnea/PND.   +Snoring and daytime sleepiness.    ECG (personally reviewed): NSR, old inferior MI.   Labs (7/19): K 4.1, creatinine 1.5 Labs (9/20): LDL 114 Labs (1/21): K 4, creatinine 1.3 Labs (12/21): LDL 45 Labs (4/22): K 4.3, creatinine 1.19  Review of systems complete and found to be negative unless listed in HPI.   PMH:  1. CAD: NSTEMI 10/12 with DES to proximal LAD.  - LHC (10/25/16): subtotal occlusion LCx/OM2, 80% dRCA/ostial PCA => DES x 2 to LCx/OM2.  On 10/30, staged procedure with DES to dRCA/ostial PDA.    2. Chronic systolic CHF: Ischemic cardiomyopathy.   - 11/12 echo with EF 15%. - Echo (3/13) with EF 50% - Echo (10/16) with EF 35-40% - Echo (10/17) with EF 10%, severe MR (appears functional).  - RHC (10/17) with mean RA 20, PA 73/36, mean PCWP 28, CI 1.5 Fick/1.74 thermo - Echo (8/20) with EF 35% by my review, trivial MR.  3. Type II diabetes 4. HTN 5. Hyperlipidemia 6. Obesity 7. OSA: Suspected.  8. 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   Social History   Socioeconomic History  . Marital status: Single    Spouse name: Not on file  . Number of children: Not on file  . Years of education: Not on file  . Highest education level: Not on file  Occupational History  . Not on file  Tobacco Use  . Smoking status: Never Smoker  . Smokeless tobacco: Never Used  Vaping Use  . Vaping Use: Never used  Substance and Sexual Activity  . Alcohol use: Not Currently    Alcohol/week: 1.0 standard drink    Types: 1 Shots of liquor per week  . Drug use: No  . Sexual activity: Yes    Birth control/protection: None  Other Topics Concern  . Not on file  Social History Narrative  . Not on  file   Social Determinants of Health   Financial Resource Strain: Low Risk   . Difficulty of Paying Living Expenses: Not very hard  Food Insecurity: No Food Insecurity  . Worried About Charity fundraiser in the Last Year: Never true  . Ran Out of Food in the Last Year: Never true  Transportation Needs: No Transportation Needs  . Lack of Transportation (Medical): No  . Lack of Transportation (Non-Medical): No  Physical Activity: Not on file  Stress: Not on file  Social Connections: Not on file  Intimate Partner Violence: Not on file   Family History  Problem Relation Age of Onset  . Heart disease Father   . Heart disease Mother        MOTHER HAD CABG     Current Outpatient Medications  Medication Sig Dispense Refill  . acetaminophen (TYLENOL) 500 MG tablet Take 500 mg by  mouth every 6 (six) hours as needed.    [name] amoxicillin-clavulanate (AUGMENTIN) 875-125 MG tablet Take 1 tablet by mouth 2 (two) times daily. 20 tablet 0  . aspirin 81 MG chewable tablet Chew 1 tablet (81 mg total) by mouth daily. 30 tablet 6  . atorvastatin (LIPITOR) 80 MG tablet TAKE 1 TABLET (80 MG TOTAL) BY MOUTH EVERY EVENING. 30 tablet 6  . Blood Glucose Monitoring Suppl (ONETOUCH VERIO REFLECT) w/Device KIT USE TO CHECK BLOOD SUGAR 3 TIMES DAILY. 1 kit 0  . Blood Glucose Monitoring Suppl (ONETOUCH VERIO) w/Device KIT Use to check blood sugar 3 times daily. 1 kit 0  . carvedilol (COREG) 6.25 MG tablet TAKE 1 TABLET (6.25 MG TOTAL) BY MOUTH 2 (TWO) TIMES DAILY WITH A MEAL. 60 tablet 6  . dapagliflozin propanediol (FARXIGA) 10 MG TABS tablet TAKE 1 TABLET (10 MG TOTAL) BY MOUTH DAILY BEFORE BREAKFAST. 30 tablet 6  . digoxin (LANOXIN) 0.125 MG tablet TAKE 1 TABLET (125 MCG TOTAL) BY MOUTH DAILY. 30 tablet 3  . Evolocumab 140 MG/ML SOAJ INJECT 1 PEN INTO THE SKIN EVERY 14 (FOURTEEN) DAYS. 2 mL 11  . furosemide (LASIX) 40 MG tablet TAKE 1 TABLET (40 MG TOTAL) BY MOUTH DAILY. 30 tablet 1  . glipiZIDE (GLUCOTROL) 10 MG tablet TAKE 1 TABLET (10 MG TOTAL) BY MOUTH 2 (TWO) TIMES DAILY BEFORE A MEAL. 60 tablet 6  . glucose blood test strip USE AS DIRECTED 3 TIMES DAILY (Patient taking differently: USE AS DIRECTED 3 TIMES DAILY) 100 strip 12  . insulin glargine (LANTUS) 100 UNIT/ML Solostar Pen INJECT 20 UNITS INTO THE SKIN DAILY. 6 mL 0  . Insulin Pen Needle 31G X 5 MM MISC USE TO INJECT INSULIN AT BEDTIME. 100 each 6  . Lancets (ONETOUCH DELICA PLUS [id number]) MISC USE TO CHECK BLOOD SUGAR 3 TIMES DAILY. 100 each 2  . omeprazole (PRILOSEC) 20 MG capsule TAKE 1 CAPSULE (20 MG TOTAL) BY MOUTH DAILY. 30 capsule 1  . OneTouch Delica Lancets 45Y MISC Use to check blood sugar 3 times daily. 100 each 2  . oxyCODONE-acetaminophen (PERCOCET) 5-325 MG tablet Take 1 tablet by mouth every 4 (four) hours as needed  for severe pain. 20 tablet 0  . sacubitril-valsartan (ENTRESTO) 97-103 MG TAKE 1 TABLET BY MOUTH 2 (TWO) TIMES DAILY. 180 tablet 3  . silver sulfADIAZINE (SILVADENE) 1 % cream APPLY 1 APPLICATION TOPICALLY DAILY. 50 g 0  . spironolactone (ALDACTONE) 50 MG tablet TAKE 1 TABLET (50 MG TOTAL) BY MOUTH DAILY. NEEDS APPOINTMENT FOR FURTHER REFILLS. 30 tablet 0   No  current facility-administered medications for this encounter.   Facility-Administered Medications Ordered in Other Encounters  Medication Dose Route Frequency Provider Last Rate Last Admin  . vancomycin (VANCOCIN) powder    PRN Evelina Bucy, DPM   1,000 mg at 01/24/21 1411   BP (!) 90/50   Pulse 88   Wt 115.8 kg (255 lb 6.4 oz)   SpO2 98%   BMI 29.51 kg/m  Wt Readings from Last 3 Encounters:  04/27/21 115.8 kg (255 lb 6.4 oz)  04/20/21 115.7 kg (255 lb 1.2 oz)  03/30/21 121.1 kg (266 lb 15.6 oz)   General: NAD Neck: No JVD, no thyromegaly or thyroid nodule.  Lungs: Clear to auscultation bilaterally with normal respiratory effort. CV: Nondisplaced PMI.  Heart regular S1/S2, no S3/S4, no murmur.  No peripheral edema.  No carotid bruit.  Difficult to palpate pedal pulses.  Abdomen: Soft, nontender, no hepatosplenomegaly, no distention.  Skin: Intact without lesions or rashes.  Neurologic: Alert and oriented x 3.  Psych: Normal affect. Extremities: No clubbing or cyanosis.  HEENT: Normal.   Assessment/Plan: 1. CAD: Cath in 10/17 after EF found to have significantly dropped.  He had DES x 2 to the LCx system and DES x 1 to RCA.  No chest pain.  - Continue ASA 81 daily.   - Continue atorvastatin 40 mg daily, good LDL 12/21.  2. Chronic systolic CHF: EF 56% on 25/63 echo, down from 35-40% in 10/16.  Ischemic cardiomyopathy.  Now s/p PCI as above.  He had low output HF with volume overload on RHC done in 10/17.  He required milrinone and diuresis while in the hospital.  We were able to titrate off milrinone.  Last echo in 8/20  with EF 35%. Currently much improved, NYHA class II symptoms with no significant volume overload.  Weight is down.  Low BP today (no orthostasis) will limit further med titration.  - Decrease lasix to 20 mg daily. BMET today.  - Continue spironolactone 50 mg daily.  - Continue Coreg 6.25 mg bid, he has not tolerated increase and BP too low today.  - Continue Entresto 97/103 bid.  - Continue dapagliflozin 10 mg daily.    - Continue digoxin, check level today.  - EF borderline for ICD on last echo.  I will arrange for repeat echo.   3. Suspect OSA: I will arrange for home sleep study.   4. Foot ulcers: Thought to be related to diabetes.  Difficult to palpate pedal pulses.  - I will arrange for peripheral arterial dopplers.  5. Obesity: Congratulated on weight loss.  - Continue efforts to lose further weight.   Followup in 4 months.   Loralie Champagne 04/29/2021

## 2021-05-01 ENCOUNTER — Other Ambulatory Visit: Payer: Self-pay

## 2021-05-01 ENCOUNTER — Other Ambulatory Visit (HOSPITAL_COMMUNITY): Payer: Self-pay

## 2021-05-01 ENCOUNTER — Ambulatory Visit (INDEPENDENT_AMBULATORY_CARE_PROVIDER_SITE_OTHER): Payer: No Typology Code available for payment source | Admitting: Podiatry

## 2021-05-01 DIAGNOSIS — E11621 Type 2 diabetes mellitus with foot ulcer: Secondary | ICD-10-CM

## 2021-05-01 MED ORDER — AMOXICILLIN-POT CLAVULANATE 875-125 MG PO TABS
1.0000 | ORAL_TABLET | Freq: Two times a day (BID) | ORAL | 0 refills | Status: DC
  Filled 2021-05-01 – 2021-05-07 (×2): qty 28, 14d supply, fill #0

## 2021-05-01 MED ORDER — LACTINEX PO CHEW
1.0000 | CHEWABLE_TABLET | Freq: Three times a day (TID) | ORAL | 2 refills | Status: DC
  Filled 2021-05-01: qty 30, 10d supply, fill #0

## 2021-05-01 MED FILL — Furosemide Tab 40 MG: ORAL | 30 days supply | Qty: 30 | Fill #1 | Status: AC

## 2021-05-01 MED FILL — Digoxin Tab 125 MCG (0.125 MG): ORAL | 30 days supply | Qty: 30 | Fill #1 | Status: AC

## 2021-05-01 MED FILL — Carvedilol Tab 6.25 MG: ORAL | 30 days supply | Qty: 60 | Fill #1 | Status: AC

## 2021-05-01 NOTE — Progress Notes (Signed)
  Subjective:  Patient ID: [name], male    DOB: August 30, 1971,  MRN: [id number]  Chief Complaint  Patient presents with  . Routine Post Op    POV DOS 01/24/2021 & 03/30/2021 & 04/20/2021 LT FOOT WOUND DEBRIDEMENT & IRRIGATION W/POSSIBLE PARTIAL 4TH METATARSAL & PROXIMAL PHALANX RESECTION IF INDECATED   DOS: 04/20/21 Procedure: Wound debridement right with closure and metatarsal resection  50 y.o. male presents with the above complaint. History confirmed with patient.   Objective:  Physical Exam: tenderness at the surgical site, local edema noted and calf supple, nontender. Incision: healing well, open area right foot - overall incision 3cm In length with 0.5x1.0 cm area left to heal by secondary intention.  Assessment:   1. Type 2 diabetes mellitus with foot ulcer (CODE) (HCC)    Plan:  Patient was evaluated and treated and all questions answered.  Post-operative State -No debridement today. Wound cleansed. Apligraft applied and packed into the wound. Graft lot [id number] exp 05/09/21 -Refill abx - plan date of end is approx 5/22  -WBAT in Surgical shoe   Left foot wound -Continues to appear healed. No debridement today.  No follow-ups on file.

## 2021-05-03 NOTE — Progress Notes (Signed)
  Subjective:  Patient ID: [name], male    DOB: Sep 27, 1971,  MRN: [id number]  Chief Complaint  Patient presents with  . Routine Post Op    POV DOS 01/24/2021 & 4/1/2022LT FOOT WOUND DEBRIDEMENT & IRRIGATION W/POSSIBLE PARTIAL 4TH METATARSAL & PROXIMAL PHALANX RESECTION IF INDECATED.     50 y.o. male presents with the above complaint. History confirmed with patient.   Objective:  Physical Exam: tenderness at the surgical site, local edema noted and calf supple, nontender. Incision: healing well, no significant drainage, no dehiscence, no significant erythema  Assessment:   1. Diabetic ulcer of left midfoot associated with type 2 diabetes mellitus, with muscle involvement without evidence of necrosis (HCC)   2. Post-operative state     Plan:  Patient was evaluated and treated and all questions answered.  Post-operative State -Dressing applied consisting of betadine, sterile gauze, kerlix and ACE bandage -WBAT in Surgical shoe  No follow-ups on file.

## 2021-05-04 NOTE — Op Note (Addendum)
Patient Name: [name] DOB: 04/26/1971  MRN: [id number]   Date of Service: 03/30/21   Surgeon: Dr. Hardie Pulley, DPM Assistants: None Pre-operative Diagnosis: Ulcer right foot Post-operative Diagnosis: Same Procedures:             1) Right foot wound debridement and   2) Right 5th metatarsal resection partial  3) Left foot wound debridement  4) Application of skin graft substitute, bilaterally Pathology/Specimens: ID Type Source Tests Collected by Time Destination  1 : right 5th metatarsal bone Tissue PATH Other SURGICAL PATHOLOGY Evelina Bucy, DPM 03/30/2021 1819   A : right foot specimen swab Tissue PATH Other AEROBIC/ANAEROBIC CULTURE W GRAM STAIN (SURGICAL/DEEP WOUND) Evelina Bucy, DPM 03/30/2021 1817   B : right 5th metatarsal bone Tissue PATH Other AEROBIC/ANAEROBIC CULTURE W Lonell Grandchild STAIN (SURGICAL/DEEP WOUND) Evelina Bucy, DPM 03/30/2021 1820    Anesthesia: MAC Hemostasis: Anatomic Estimated Blood Loss: 10 ml Materials:  Implant Name Type Inv. Item Serial No. Manufacturer Lot No. LRB No. Used Action  IMPL PURAPLYAM-COM 3X4 12 SQ - [id number] Tissue IMPL PURAPLYAM-COM 3X4 12 SQ [id number] [employer] [id number] Right 1 Implanted  MEMBRANE AMNIO 2.5X2.5 AFFNTY - [id number] Tissue MEMBRANE AMNIO 2.5X2.5 AFFNTY [id number] [employer]  Left 1 Implanted    Medications: none Complications: None  Indications for Procedure:  This is a 50 y.o. male with a chronic right foot wound who presents for debridement of the wound.   Procedure in Detail: Patient was identified in pre-operative holding area. Formal consent was signed and both lower extremities were marked. Patient was brought back to the operating room and placed on the operating room table in the supine position. Anesthesia was induced.   Both extremities were prepped and draped in the usual sterile fashion. Timeout was taken to confirm patient name, laterality, and procedure prior to  incision.   Attention was first directed to the left foot. There was a wound measuring 1x0.5. The wound was sharply excisionally debrided with a 15 blade to bleeding viable tissues. Post-debridement the wound measured 1x1. The wound was debrided to dermis. Organ[employer] Affinity graft was then applied to the left side, covered with mepitel, and dry sterile dressing.  Attention was then directed to the right foot where a wound measuring 2x1.5 was encountered. Swab culture was performed. The wound was sharply excisionally debrided with a 15 blade Debridement was performed to the 5th metatarsal bone. The 5th metatarsal headwas excised with a sagittal saw through the wound. The bone was sent for pathology and microbiology. The wound was then thoroughly irrigated with 3L of NS via pulse lavage. Puraply was packed into the wound and hydrated with saline. The wound was then loosely reapproximated with nylon suture.  The foot was then dressed with 4x4, kerlix, and ACE bandage. Patient tolerated the procedure well.   Disposition: Following a period of post-operative monitoring, patient will be transferred back home. He will likely benefit from RTOR at a later date for more definitive closure and possible further debridement.

## 2021-05-06 ENCOUNTER — Ambulatory Visit: Payer: No Typology Code available for payment source

## 2021-05-06 DIAGNOSIS — G4719 Other hypersomnia: Secondary | ICD-10-CM

## 2021-05-06 NOTE — Procedures (Signed)
   Sleep Study Report  Patient Information Name: [name]  ID: [id number] Birth Date: September 21, 1971  Age: 50  Gender:Male Study Date:04/29/2021 Referring Physician: Marca Ancona, MD  TEST DESCRIPTION: Home sleep apnea testing was completed using the WatchPat, a Type 1 device, utilizing  peripheral arterial tonometry (PAT), chest movement, actigraphy, pulse oximetry, pulse rate, body position and snore.  AHI was calculated with apnea and hypopnea using valid sleep time as the denominator. RDI includes apneas,  hypopneas, and RERAs. The data acquired and the scoring of sleep and all associated events were performed in  accordance with the recommended standards and specifications as outlined in the AASM Manual for the Scoring of  Sleep and Associated Events 2.2.0 (2015).   FINDINGS:  1. Mild Obstructive Sleep Apnea with AHI 8.8/hr.  2. No Central Sleep Apnea with pAHIc 0.1/hr.  3. Oxygen desaturations as low as 87%.  4. Mild snoring was present. O2 sats were < 88% for 0.5 min. 5. Total sleep time was 7 hrs and .  6. 20.7% of total sleep time was spent in REM sleep.  7. Prolonged sleep onset latency at 40 min. 8. Normal REM sleep onset latency at 70 min.  9. Total awakenings were 9.   DIAGNOSIS: Mild Obstructive Sleep Apnea (G47.33)  RECOMMENDATIONS:  1. Clinical correlation of these findings is necessary. The decision to treat obstructive sleep apnea (OSA) is usually  based on the presence of apnea symptoms or the presence of associated medical conditions such as Hypertension,  Congestive Heart Failure, Atrial Fibrillation or Obesity. The most common symptoms of OSA are snoring, gasping for  breath while sleeping, daytime sleepiness and fatigue.   2. Initiating apnea therapy is recommended given the presence of symptoms and/or associated conditions.  Recommend proceeding with one of the following:   a. Auto-CPAP therapy with a pressure range of 5-20cm H2O.   b. An  oral appliance (OA) that can be obtained from certain dentists with expertise in sleep medicine. These are  primarily of use in non-obese patients with mild and moderate disease.   c. An ENT consultation which may be useful to look for specific causes of obstruction and possible treatment  Options.   d. If patient is intolerant to PAP therapy, consider referral to ENT for evaluation for hypoglossal nerve stimulator.   3. Close follow-up is necessary to ensure success with CPAP or oral appliance therapy for maximum benefit .  4. A follow-up oximetry study on CPAP is recommended to assess the adequacy of therapy and determine the need  for supplemental oxygen or the potential need for Bi-level therapy. An arterial blood gas to determine the adequacy of  baseline ventilation and oxygenation should also be considered.  5. Healthy sleep recommendations include: adequate nightly sleep (normal 7-9 hrs/night), avoidance of caffeine after  noon and alcohol near bedtime, and maintaining a sleep environment that is cool, dark and quiet.  6. Weight loss for overweight patients is recommended. Even modest amounts of weight loss can significantly  improve the severity of sleep apnea.  7. Snoring recommendations include: weight loss where appropriate, side sleeping, and avoidance of alcohol before  Bed.  8. Operation of motor vehicle or dangerous equipment must be avoided when feeling drowsy, excessively sleepy, or  mentally fatigued.  Report prepared by: Signature: Armanda Magic, MD [facility]; Diplomat, American Board of Sleep Medicine  Electronically Signed: May 06, 2021

## 2021-05-07 ENCOUNTER — Other Ambulatory Visit: Payer: Self-pay

## 2021-05-08 ENCOUNTER — Other Ambulatory Visit: Payer: Self-pay | Admitting: Critical Care Medicine

## 2021-05-08 ENCOUNTER — Other Ambulatory Visit: Payer: Self-pay

## 2021-05-08 ENCOUNTER — Ambulatory Visit: Payer: No Typology Code available for payment source | Admitting: Critical Care Medicine

## 2021-05-08 NOTE — Telephone Encounter (Signed)
Requested medications are due for refill today yes  Requested medications are on the active medication list yes  Last refill 4/7  Last visit 3/28  Future visit scheduled Pt canceled  Notes to clinic Rx states need appt before refills, pt had appt but canceled.

## 2021-05-09 ENCOUNTER — Other Ambulatory Visit: Payer: Self-pay

## 2021-05-09 ENCOUNTER — Telehealth: Payer: Self-pay | Admitting: *Deleted

## 2021-05-09 DIAGNOSIS — G4733 Obstructive sleep apnea (adult) (pediatric): Secondary | ICD-10-CM

## 2021-05-09 MED ORDER — SPIRONOLACTONE 50 MG PO TABS
50.0000 mg | ORAL_TABLET | Freq: Every day | ORAL | 2 refills | Status: DC
Start: 2021-05-09 — End: 2021-08-10
  Filled 2021-05-09: qty 30, 30d supply, fill #0
  Filled 2021-06-05: qty 30, 30d supply, fill #1
  Filled 2021-07-04: qty 30, 30d supply, fill #2

## 2021-05-09 NOTE — Telephone Encounter (Signed)
The patient has been notified of the result and verbalized understanding.  All questions (if any) were answered.  Pt is aware and agreeable to his results.  Upon patient request DME selection is [facility] Patient understands he will be contacted by [facility] to set up his cpap. Patient understands to call if [facility] does not contact him with new setup in a timely manner. Patient understands they will be called once confirmation has been received from [facility] that they have received their new machine to schedule 10 week follow up appointment.   [facility] notified of new cpap order  Please add to airview Patient was grateful for the call and thanked me

## 2021-05-09 NOTE — Telephone Encounter (Signed)
-----   Message from Quintella Reichert, MD sent at 05/06/2021  9:25 PM EDT ----- Please let patient know that they have sleep apnea and recommend treating with CPAP.  Please order an auto CPAP from 4-15cm H2O with heated humidity and mask of choice.  Followup with me in 6 weeks. Needs overnight pulse ox on CPAP

## 2021-05-11 ENCOUNTER — Ambulatory Visit (INDEPENDENT_AMBULATORY_CARE_PROVIDER_SITE_OTHER): Payer: No Typology Code available for payment source | Admitting: Podiatry

## 2021-05-11 ENCOUNTER — Other Ambulatory Visit: Payer: Self-pay

## 2021-05-11 DIAGNOSIS — M86272 Subacute osteomyelitis, left ankle and foot: Secondary | ICD-10-CM

## 2021-05-11 DIAGNOSIS — E11621 Type 2 diabetes mellitus with foot ulcer: Secondary | ICD-10-CM

## 2021-05-11 DIAGNOSIS — M86671 Other chronic osteomyelitis, right ankle and foot: Secondary | ICD-10-CM

## 2021-05-11 MED ORDER — AMOXICILLIN-POT CLAVULANATE 875-125 MG PO TABS
1.0000 | ORAL_TABLET | Freq: Two times a day (BID) | ORAL | 0 refills | Status: DC
  Filled 2021-05-11 – 2021-05-18 (×2): qty 14, 7d supply, fill #0

## 2021-05-11 NOTE — Progress Notes (Signed)
  Subjective:  Patient ID: [name], male    DOB: 1971-02-15,  MRN: [id number]  Chief Complaint  Patient presents with  . Routine Post Op    Pt denies any concerns. Denies fever/chills/nausea/vomiting. There is slight drainage with no malodor. Pt states improvement.   DOS: 04/20/21 Procedure: Wound debridement right with closure and metatarsal resection  50 y.o. male presents with the above complaint. History confirmed with patient.   Objective:  Physical Exam: tenderness at the surgical site, local edema noted and calf supple, nontender. Incision: healing well, open area right foot with mild maceration of skin edges no dehiscence.   Assessment:   1. Type 2 diabetes mellitus with foot ulcer (CODE) (HCC)   2. Subacute osteomyelitis of left foot (HCC)   3. Other chronic osteomyelitis of right foot (HCC)    Plan:  Patient was evaluated and treated and all questions answered.  Post-operative State -No debridement today. Wound cleansed and betadine applied.  Dry dressing applied. -Refill abx - plan date of end is approx 5/22  -WBAT in Surgical shoe   Left foot wound -Essentially healed. Ok to leave OTA   No follow-ups on file.

## 2021-05-15 ENCOUNTER — Encounter: Payer: No Typology Code available for payment source | Admitting: Podiatry

## 2021-05-17 ENCOUNTER — Other Ambulatory Visit: Payer: Self-pay

## 2021-05-17 MED FILL — Evolocumab Subcutaneous Soln Auto-Injector 140 MG/ML: SUBCUTANEOUS | 30 days supply | Qty: 2 | Fill #0 | Status: CN

## 2021-05-17 MED FILL — Sacubitril-Valsartan Tab 97-103 MG: ORAL | 30 days supply | Qty: 60 | Fill #0 | Status: AC

## 2021-05-17 MED FILL — Evolocumab Subcutaneous Soln Auto-Injector 140 MG/ML: SUBCUTANEOUS | 28 days supply | Qty: 2 | Fill #0 | Status: AC

## 2021-05-18 ENCOUNTER — Ambulatory Visit (INDEPENDENT_AMBULATORY_CARE_PROVIDER_SITE_OTHER): Payer: No Typology Code available for payment source | Admitting: Podiatry

## 2021-05-18 ENCOUNTER — Other Ambulatory Visit: Payer: Self-pay

## 2021-05-18 DIAGNOSIS — M86272 Subacute osteomyelitis, left ankle and foot: Secondary | ICD-10-CM

## 2021-05-18 DIAGNOSIS — E11621 Type 2 diabetes mellitus with foot ulcer: Secondary | ICD-10-CM

## 2021-05-18 NOTE — Progress Notes (Signed)
  Subjective:  Patient ID: [name], male    DOB: 1971-10-22,  MRN: [id number]  Chief Complaint  Patient presents with  . Wound Check    Pt states no new concerns, denies fever/chills/nausea/vomiting. Pt states decrease in drainage and overall improvement. There are loose staple(s) noted.   DOS: 04/20/21 Procedure: Wound debridement right with closure and metatarsal resection  50 y.o. male presents with the above complaint. History confirmed with patient.   Objective:  Physical Exam: tenderness at the surgical site, local edema noted and calf supple, nontender. Incision: healing well, open area right foot with mild maceration of skin edges no dehiscence.   Assessment:   1. Type 2 diabetes mellitus with foot ulcer (CODE) (HCC)   2. Subacute osteomyelitis of left foot (HCC)    Plan:  Patient was evaluated and treated and all questions answered.  Post-operative State -No debridement today. Staples removed. Sutures left intact. Dressed with silvadene and DSD. Patient to do so daily. -Continue abx to completion -WBAT in Surgical shoe   Left foot wound -Small open area today dressed with silvadene and band-aid.  No follow-ups on file.

## 2021-05-22 ENCOUNTER — Other Ambulatory Visit (HOSPITAL_COMMUNITY): Payer: Self-pay | Admitting: Cardiology

## 2021-05-22 DIAGNOSIS — L97421 Non-pressure chronic ulcer of left heel and midfoot limited to breakdown of skin: Secondary | ICD-10-CM

## 2021-05-22 DIAGNOSIS — E11621 Type 2 diabetes mellitus with foot ulcer: Secondary | ICD-10-CM

## 2021-05-22 LAB — FUNGAL ORGANISM REFLEX

## 2021-05-22 LAB — FUNGUS CULTURE WITH STAIN

## 2021-05-22 LAB — FUNGUS CULTURE RESULT

## 2021-05-23 LAB — FUNGUS CULTURE WITH STAIN

## 2021-05-23 LAB — FUNGAL ORGANISM REFLEX

## 2021-05-23 LAB — FUNGUS CULTURE RESULT

## 2021-05-24 ENCOUNTER — Ambulatory Visit (INDEPENDENT_AMBULATORY_CARE_PROVIDER_SITE_OTHER): Payer: No Typology Code available for payment source | Admitting: Podiatry

## 2021-05-24 ENCOUNTER — Encounter: Payer: Self-pay | Admitting: Podiatry

## 2021-05-24 ENCOUNTER — Other Ambulatory Visit: Payer: Self-pay

## 2021-05-24 DIAGNOSIS — L97511 Non-pressure chronic ulcer of other part of right foot limited to breakdown of skin: Secondary | ICD-10-CM

## 2021-05-24 DIAGNOSIS — E11621 Type 2 diabetes mellitus with foot ulcer: Secondary | ICD-10-CM | POA: Diagnosis not present

## 2021-05-24 NOTE — Progress Notes (Signed)
  Subjective:  Patient ID: [name], male    DOB: 07-Jul-1971,  MRN: [id number]  Chief Complaint  Patient presents with  . Foot Ulcer    I have a blister on the right big toe and that popped up this past monday  . Routine Post Op    I had to put betadine on the outside of the incision site on the right foot    DOS: 04/20/21 Procedure: Wound debridement right with closure and metatarsal resection  50 y.o. male presents with the above complaint. History confirmed with patient.   Objective:  Physical Exam: tenderness at the surgical site, local edema noted and calf supple, nontender. Incision: healing well, open area right foot with mild maceration of skin edges no dehiscence. Sutures intact. 0.5cm ulceration right hallux without warmth,erythema,SOI  Assessment:   1. Diabetic ulcer of toe of right foot associated with type 2 diabetes mellitus, limited to breakdown of skin (HCC)    Plan:  Patient was evaluated and treated and all questions answered.  Post-operative State -Sutures removed -Improving. -Mild erythema without active warmth. Will monitor -Continue Abx to completion  Right hallux wound -Likely 2/2 shoe. Pt will use other he has at home otherwise advise he call office for a replacement -Dressed with silvadene and band-aid. Pt to do so daily.  Left foot wound -Left OTA  No follow-ups on file.

## 2021-05-25 ENCOUNTER — Encounter: Payer: No Typology Code available for payment source | Admitting: Podiatry

## 2021-05-30 ENCOUNTER — Other Ambulatory Visit: Payer: Self-pay

## 2021-05-30 ENCOUNTER — Other Ambulatory Visit: Payer: Self-pay | Admitting: Critical Care Medicine

## 2021-05-30 DIAGNOSIS — E1169 Type 2 diabetes mellitus with other specified complication: Secondary | ICD-10-CM

## 2021-05-30 MED ORDER — LANTUS SOLOSTAR 100 UNIT/ML ~~LOC~~ SOPN
20.0000 [IU] | PEN_INJECTOR | Freq: Every day | SUBCUTANEOUS | 0 refills | Status: DC
  Filled 2021-05-30: qty 6, 30d supply, fill #0

## 2021-05-30 MED FILL — Omeprazole Cap Delayed Release 20 MG: ORAL | 30 days supply | Qty: 30 | Fill #1 | Status: AC

## 2021-05-30 MED FILL — Atorvastatin Calcium Tab 80 MG (Base Equivalent): ORAL | 30 days supply | Qty: 30 | Fill #1 | Status: AC

## 2021-06-01 ENCOUNTER — Encounter: Payer: No Typology Code available for payment source | Admitting: Podiatry

## 2021-06-01 ENCOUNTER — Inpatient Hospital Stay (HOSPITAL_COMMUNITY): Admission: RE | Admit: 2021-06-01 | Payer: No Typology Code available for payment source | Source: Ambulatory Visit

## 2021-06-04 ENCOUNTER — Ambulatory Visit (INDEPENDENT_AMBULATORY_CARE_PROVIDER_SITE_OTHER): Payer: No Typology Code available for payment source | Admitting: Podiatry

## 2021-06-04 ENCOUNTER — Other Ambulatory Visit: Payer: Self-pay

## 2021-06-04 DIAGNOSIS — E11621 Type 2 diabetes mellitus with foot ulcer: Secondary | ICD-10-CM

## 2021-06-04 DIAGNOSIS — L97511 Non-pressure chronic ulcer of other part of right foot limited to breakdown of skin: Secondary | ICD-10-CM | POA: Diagnosis not present

## 2021-06-05 ENCOUNTER — Other Ambulatory Visit: Payer: Self-pay | Admitting: Critical Care Medicine

## 2021-06-05 ENCOUNTER — Other Ambulatory Visit: Payer: Self-pay

## 2021-06-05 MED ORDER — FUROSEMIDE 40 MG PO TABS
ORAL_TABLET | Freq: Every day | ORAL | 0 refills | Status: DC
  Filled 2021-06-05: qty 30, 30d supply, fill #0

## 2021-06-05 MED FILL — Digoxin Tab 125 MCG (0.125 MG): ORAL | 30 days supply | Qty: 30 | Fill #2 | Status: AC

## 2021-06-06 MED FILL — Carvedilol Tab 6.25 MG: ORAL | 30 days supply | Qty: 60 | Fill #2 | Status: AC

## 2021-06-07 ENCOUNTER — Other Ambulatory Visit: Payer: Self-pay

## 2021-06-07 ENCOUNTER — Telehealth (HOSPITAL_COMMUNITY): Payer: Self-pay | Admitting: *Deleted

## 2021-06-07 MED FILL — Glipizide Tab 10 MG: ORAL | 30 days supply | Qty: 60 | Fill #0 | Status: AC

## 2021-06-07 NOTE — Telephone Encounter (Signed)
Pt called stating he felt like he pulled a muscle when he was mowing. He has light chest pain and back pain. Pt said if he lifts his arm up it hurts worse but if he's relaxed it goes away. Pt advised that it doesn't sound like a cardiac issue but I would follow up with Dr.McLean.   Routed to Dr.McLean for advice

## 2021-06-07 NOTE — Telephone Encounter (Signed)
Sounds muscular from the description.

## 2021-06-08 ENCOUNTER — Other Ambulatory Visit: Payer: Self-pay

## 2021-06-12 ENCOUNTER — Telehealth (HOSPITAL_COMMUNITY): Payer: Self-pay | Admitting: Cardiology

## 2021-06-12 NOTE — Telephone Encounter (Signed)
Pt called in regard to BP check on Friday, and requesting lab work, please advise

## 2021-06-15 ENCOUNTER — Other Ambulatory Visit: Payer: Self-pay

## 2021-06-15 ENCOUNTER — Encounter (HOSPITAL_COMMUNITY): Payer: Self-pay

## 2021-06-15 ENCOUNTER — Ambulatory Visit (HOSPITAL_COMMUNITY)
Admission: RE | Admit: 2021-06-15 | Discharge: 2021-06-15 | Disposition: A | Discharge status: Home / Self Care | Payer: No Typology Code available for payment source | Source: Ambulatory Visit | Attending: Internal Medicine | Admitting: Internal Medicine

## 2021-06-15 ENCOUNTER — Encounter: Payer: No Typology Code available for payment source | Admitting: Podiatry

## 2021-06-15 VITALS — BP 122/74 | HR 67 | Wt 263.2 lb

## 2021-06-15 DIAGNOSIS — M791 Myalgia, unspecified site: Secondary | ICD-10-CM | POA: Insufficient documentation

## 2021-06-15 DIAGNOSIS — I5042 Chronic combined systolic (congestive) and diastolic (congestive) heart failure: Secondary | ICD-10-CM | POA: Diagnosis not present

## 2021-06-15 LAB — BASIC METABOLIC PANEL
Anion gap: 7 (ref 5–15)
BUN: 19 mg/dL (ref 6–20)
CO2: 27 mmol/L (ref 22–32)
Calcium: 9.1 mg/dL (ref 8.9–10.3)
Chloride: 105 mmol/L (ref 98–111)
Creatinine, Ser: 1.19 mg/dL (ref 0.61–1.24)
GFR, Estimated: >60 mL/min (ref >60)
Glucose, Bld: 123 mg/dL — ABNORMAL HIGH (ref 70–99)
Potassium: 4.3 mmol/L (ref 3.5–5.1)
Sodium: 139 mmol/L (ref 135–145)

## 2021-06-15 LAB — BRAIN NATRIURETIC PEPTIDE: B Natriuretic Peptide: 49.5 pg/mL (ref 0.0–100.0)

## 2021-06-15 MED FILL — Evolocumab Subcutaneous Soln Auto-Injector 140 MG/ML: SUBCUTANEOUS | 28 days supply | Qty: 2 | Fill #1 | Status: AC

## 2021-06-15 NOTE — Progress Notes (Signed)
Patient seen in clinic today for nurse visit. Patient c/o muscle pain around heart and back. Pt recently mowed and felt like he over did it. Pt said when he lifted his arm out his window to get food at a drive thru the pain was worse. Pain comes and goes. Per Dr.McLean does not sound like a cardiac issue. Pt asked for ekg and blood work just to ease his mind. Ekg NSR. No changes for now per Dr.McLean

## 2021-06-17 NOTE — Progress Notes (Signed)
  Subjective:  Patient ID: [name], male    DOB: October 31, 1971,  MRN: [id number]  No chief complaint on file.  DOS: 04/20/21 Procedure: Wound debridement right with closure and metatarsal resection  50 y.o. male presents for follow-up states that the sores are doing better.  Denies new issues.  Objective:  Physical Exam: tenderness at the surgical site, local edema noted and calf supple, nontender. Incision: healing well, open area right foot with mild maceration of skin edges no dehiscence. Sutures intact. 0.3 cm ulceration right hallux without warmth,erythema,SOI  Assessment:   1. Diabetic ulcer of toe of right foot associated with type 2 diabetes mellitus, limited to breakdown of skin (HCC)     Plan:  Patient was evaluated and treated and all questions answered.  Post-operative State -Appears mostly healed.  Dressed with Silvadene and dry sterile dressing  Right hallux wound -Improving.  Continue dressed with Silvadene Band-Aid daily daily  Left foot wound -Dressed with Silvadene and Band-Aid today.  Patient to do so every few days  No follow-ups on file.

## 2021-06-20 MED FILL — Sacubitril-Valsartan Tab 97-103 MG: ORAL | 30 days supply | Qty: 60 | Fill #1 | Status: CN

## 2021-06-21 ENCOUNTER — Other Ambulatory Visit (HOSPITAL_COMMUNITY): Payer: Self-pay

## 2021-06-22 ENCOUNTER — Other Ambulatory Visit: Payer: Self-pay

## 2021-06-22 ENCOUNTER — Ambulatory Visit (INDEPENDENT_AMBULATORY_CARE_PROVIDER_SITE_OTHER): Payer: No Typology Code available for payment source | Admitting: Podiatry

## 2021-06-22 DIAGNOSIS — L97425 Non-pressure chronic ulcer of left heel and midfoot with muscle involvement without evidence of necrosis: Secondary | ICD-10-CM | POA: Diagnosis not present

## 2021-06-22 DIAGNOSIS — E11621 Type 2 diabetes mellitus with foot ulcer: Secondary | ICD-10-CM

## 2021-06-22 NOTE — Progress Notes (Addendum)
  Subjective:  Patient ID: [name], male    DOB: December 22, 1971,  MRN: [id number]  Chief Complaint  Patient presents with   Wound Check    Denies fever/chills/nausea/vomiting. No new concerns. Pt states healing well.   Peripheral Neuropathy    Pt states occasional numbness in toes, wants to discuss neuropathy medication.   DOS: 04/20/21 Procedure: Wound debridement right with closure and metatarsal resection  50 y.o. male presents for follow-up states that the sores are doing better.  Denies new issues.  Objective:  Physical Exam: tenderness at the surgical site, local edema noted and calf supple, nontender. Incision: Right lateral foot wound well healed. Healing small ulceration right hallux without warmth,erythema,SOI  Assessment:   1. Diabetic ulcer of toe of right foot associated with type 2 diabetes mellitus, limited to breakdown of skin (HCC)   2. Type 2 diabetes mellitus with foot ulcer (CODE) (HCC)   3. Diabetic ulcer of left midfoot associated with type 2 diabetes mellitus, with muscle involvement without evidence of necrosis [facility])    Plan:  Patient was evaluated and treated and all questions answered.  Post-operative State -Essentially healed. No warmth, erythema appreciated today.  Right hallux wound -Almost fully resolved.  Left foot wound -Minimal debridement, dressed with ointment and band-aid. Continue daily.  Return in about 4 weeks (around 07/20/2021) for Wound Care.

## 2021-06-22 NOTE — Addendum Note (Signed)
Addended by: [name] on: 06/22/2021 08:24 AM   Modules accepted: Level of Service

## 2021-06-25 ENCOUNTER — Telehealth: Payer: Self-pay | Admitting: Podiatry

## 2021-06-25 ENCOUNTER — Other Ambulatory Visit: Payer: Self-pay

## 2021-06-25 ENCOUNTER — Ambulatory Visit: Payer: No Typology Code available for payment source | Admitting: Critical Care Medicine

## 2021-06-25 ENCOUNTER — Other Ambulatory Visit (HOSPITAL_COMMUNITY): Payer: Self-pay

## 2021-06-25 ENCOUNTER — Telehealth (HOSPITAL_COMMUNITY): Payer: Self-pay

## 2021-06-25 ENCOUNTER — Other Ambulatory Visit: Payer: Self-pay | Admitting: Critical Care Medicine

## 2021-06-25 DIAGNOSIS — E669 Obesity, unspecified: Secondary | ICD-10-CM

## 2021-06-25 MED ORDER — LANTUS SOLOSTAR 100 UNIT/ML ~~LOC~~ SOPN
20.0000 [IU] | PEN_INJECTOR | Freq: Every day | SUBCUTANEOUS | 3 refills | Status: DC
  Filled 2021-06-25 – 2021-06-26 (×2): qty 6, 30d supply, fill #0
  Filled 2021-07-28 – 2021-07-30 (×2): qty 6, 30d supply, fill #1
  Filled 2021-07-30: qty 6, 30d supply, fill #0
  Filled 2021-09-07: qty 6, 30d supply, fill #1
  Filled 2021-10-16: qty 6, 30d supply, fill #2

## 2021-06-25 MED ORDER — GABAPENTIN 300 MG PO CAPS
300.0000 mg | ORAL_CAPSULE | Freq: Every day | ORAL | 0 refills | Status: DC
  Filled 2021-06-25: qty 30, 30d supply, fill #0

## 2021-06-25 MED FILL — Sacubitril-Valsartan Tab 97-103 MG: ORAL | 30 days supply | Qty: 60 | Fill #1 | Status: AC

## 2021-06-25 NOTE — Telephone Encounter (Signed)
Patient wanted to know if it was ok for him to start gabapentin that is being prescribed by his podiatrist.

## 2021-06-25 NOTE — Telephone Encounter (Signed)
Patient advised and verbalized understanding 

## 2021-06-25 NOTE — Telephone Encounter (Signed)
Pt would like to start gabapentin as discussed in [facility]

## 2021-06-25 NOTE — Addendum Note (Signed)
Addended by: [name] on: 06/25/2021 05:06 PM   Modules accepted: Orders

## 2021-06-25 NOTE — Telephone Encounter (Signed)
OK to start gabapentin.

## 2021-06-26 ENCOUNTER — Other Ambulatory Visit: Payer: Self-pay

## 2021-06-26 ENCOUNTER — Other Ambulatory Visit (HOSPITAL_COMMUNITY): Payer: Self-pay

## 2021-06-27 ENCOUNTER — Other Ambulatory Visit: Payer: Self-pay

## 2021-06-29 ENCOUNTER — Encounter: Payer: Self-pay | Admitting: Podiatry

## 2021-07-03 ENCOUNTER — Other Ambulatory Visit: Payer: Self-pay | Admitting: Podiatry

## 2021-07-04 ENCOUNTER — Other Ambulatory Visit: Payer: Self-pay | Admitting: Critical Care Medicine

## 2021-07-04 MED FILL — Atorvastatin Calcium Tab 80 MG (Base Equivalent): ORAL | 30 days supply | Qty: 30 | Fill #2 | Status: AC

## 2021-07-04 MED FILL — Carvedilol Tab 6.25 MG: ORAL | 30 days supply | Qty: 60 | Fill #3 | Status: AC

## 2021-07-04 MED FILL — Glipizide Tab 10 MG: ORAL | 30 days supply | Qty: 60 | Fill #1 | Status: AC

## 2021-07-04 MED FILL — Digoxin Tab 125 MCG (0.125 MG): ORAL | 30 days supply | Qty: 30 | Fill #3 | Status: AC

## 2021-07-05 ENCOUNTER — Other Ambulatory Visit: Payer: Self-pay

## 2021-07-05 MED ORDER — OMEPRAZOLE 20 MG PO CPDR
20.0000 mg | DELAYED_RELEASE_CAPSULE | Freq: Every day | ORAL | 0 refills | Status: DC
  Filled 2021-07-05: qty 30, 30d supply, fill #0

## 2021-07-06 ENCOUNTER — Ambulatory Visit (HOSPITAL_COMMUNITY): Payer: No Typology Code available for payment source

## 2021-07-09 ENCOUNTER — Other Ambulatory Visit: Payer: Self-pay

## 2021-07-19 ENCOUNTER — Ambulatory Visit (INDEPENDENT_AMBULATORY_CARE_PROVIDER_SITE_OTHER): Payer: No Typology Code available for payment source | Admitting: Podiatry

## 2021-07-19 ENCOUNTER — Encounter: Payer: Self-pay | Admitting: Podiatry

## 2021-07-19 ENCOUNTER — Other Ambulatory Visit: Payer: Self-pay

## 2021-07-19 ENCOUNTER — Ambulatory Visit (INDEPENDENT_AMBULATORY_CARE_PROVIDER_SITE_OTHER): Payer: No Typology Code available for payment source

## 2021-07-19 DIAGNOSIS — L97529 Non-pressure chronic ulcer of other part of left foot with unspecified severity: Secondary | ICD-10-CM

## 2021-07-19 DIAGNOSIS — R609 Edema, unspecified: Secondary | ICD-10-CM | POA: Diagnosis not present

## 2021-07-26 ENCOUNTER — Other Ambulatory Visit: Payer: Self-pay | Admitting: Critical Care Medicine

## 2021-07-26 MED FILL — Sacubitril-Valsartan Tab 97-103 MG: ORAL | 30 days supply | Qty: 60 | Fill #2 | Status: CN

## 2021-07-26 MED FILL — Carvedilol Tab 6.25 MG: ORAL | 30 days supply | Qty: 60 | Fill #4 | Status: AC

## 2021-07-26 NOTE — Progress Notes (Signed)
  Subjective:  Patient ID: [name], male    DOB: [birth year],  MRN: [id number]  Chief Complaint  Patient presents with   Routine Post Op    The right foot is doing good and all healed up but there is some swelling and the left foot still has the spot and my have some draining    DOS: 04/20/21 Procedure: Wound debridement right with closure and metatarsal resection  50 y.o. male presents for follow-up. Hx confirmed as above.  Objective:  Physical Exam: tenderness at the surgical site, local edema noted and calf supple, nontender. Incision: Right lateral foot wound well healed, but with new local edema without warmth, redness, drainage. Left 5th metatarsal small pinpoint ulceration with macerated wound bed, no warmth,erythema, SOI Assessment:   1. Edema, unspecified type   2. Ulcer of left foot, unspecified ulcer stage (HCC)    Plan:  Patient was evaluated and treated and all questions answered.  Right foot edema -XR taken and reviwed. Fracture of the 5th metatarsal. Possibly pathologic. No evidence of acute osteomyelitis. Should issues persist consider MRI to evaluate. For now continue offloading.  Left foot wound -Minimal debridement, dressed with ointment and band-aid. Continue daily.  No follow-ups on file.

## 2021-07-26 NOTE — Telephone Encounter (Signed)
Requested medications are due for refill today yes  Requested medications are on the active medication list yes  Last refill 7/11  Last visit   Future visit scheduled 03/26/21  Notes to clinic Has already had a curtesy refill and there is no upcoming appointment scheduled. Pt had office visit scheduled 6/27 and canceled.

## 2021-07-27 ENCOUNTER — Other Ambulatory Visit (HOSPITAL_COMMUNITY): Payer: Self-pay

## 2021-07-27 ENCOUNTER — Encounter: Payer: No Typology Code available for payment source | Admitting: Podiatry

## 2021-07-27 ENCOUNTER — Other Ambulatory Visit: Payer: Self-pay

## 2021-07-30 ENCOUNTER — Other Ambulatory Visit (HOSPITAL_COMMUNITY): Payer: Self-pay

## 2021-07-30 ENCOUNTER — Other Ambulatory Visit: Payer: Self-pay

## 2021-07-30 ENCOUNTER — Other Ambulatory Visit (HOSPITAL_COMMUNITY): Payer: Self-pay | Admitting: *Deleted

## 2021-07-30 MED ORDER — FUROSEMIDE 40 MG PO TABS
ORAL_TABLET | Freq: Every day | ORAL | 3 refills | Status: DC
  Filled 2021-07-30: qty 30, 30d supply, fill #0
  Filled 2021-08-29: qty 30, 30d supply, fill #1
  Filled 2021-10-02: qty 30, 30d supply, fill #2

## 2021-07-30 MED FILL — Sacubitril-Valsartan Tab 97-103 MG: ORAL | 30 days supply | Qty: 60 | Fill #0 | Status: CN

## 2021-07-30 MED FILL — Sacubitril-Valsartan Tab 97-103 MG: ORAL | 30 days supply | Qty: 60 | Fill #2 | Status: CN

## 2021-07-30 MED FILL — Evolocumab Subcutaneous Soln Auto-Injector 140 MG/ML: SUBCUTANEOUS | 28 days supply | Qty: 2 | Fill #2 | Status: AC

## 2021-07-30 MED FILL — Evolocumab Subcutaneous Soln Auto-Injector 140 MG/ML: SUBCUTANEOUS | 28 days supply | Qty: 2 | Fill #2 | Status: CN

## 2021-07-31 ENCOUNTER — Other Ambulatory Visit: Payer: Self-pay | Admitting: Critical Care Medicine

## 2021-07-31 ENCOUNTER — Other Ambulatory Visit: Payer: Self-pay

## 2021-07-31 MED ORDER — DIGOXIN 125 MCG PO TABS
ORAL_TABLET | Freq: Every day | ORAL | 2 refills | Status: DC
  Filled 2021-07-31 – 2021-08-09 (×2): qty 30, 30d supply, fill #0
  Filled 2021-09-06: qty 30, 30d supply, fill #1

## 2021-07-31 MED ORDER — OMEPRAZOLE 20 MG PO CPDR
20.0000 mg | DELAYED_RELEASE_CAPSULE | Freq: Every day | ORAL | 0 refills | Status: DC
  Filled 2021-07-31 – 2021-08-09 (×2): qty 30, 30d supply, fill #0

## 2021-07-31 NOTE — Telephone Encounter (Signed)
Patient will need office visit for further refills. Requested Prescriptions  Pending Prescriptions Disp Refills  . digoxin (LANOXIN) 0.125 MG tablet 30 tablet 2    Sig: TAKE 1 TABLET (125 MCG TOTAL) BY MOUTH DAILY.     Cardiovascular:  Antiarrhythmic Agents - digoxin Passed - 07/31/2021  5:56 PM      Passed - Cr in normal range and within 180 days    Creat  Date Value Ref Range Status  07/11/2016 1.38 (H) 0.60 - 1.35 mg/dL Final   Creatinine, Ser  Date Value Ref Range Status  06/15/2021 1.19 0.61 - 1.24 mg/dL Final         Passed - Digoxin (serum) in normal range and within 180 days    Digoxin Level  Date Value Ref Range Status  04/27/2021 0.9 0.8 - 2.0 ng/mL Final    Comment:    Performed at [facility], [address], [place], [place] [address]         Passed - Ca in normal range and within 180 days    Calcium  Date Value Ref Range Status  06/15/2021 9.1 8.9 - 10.3 mg/dL Final   Calcium, Ion  Date Value Ref Range Status  03/02/2011 1.08 (L) 1.12 - 1.32 mmol/L Final         Passed - K in normal range and within 180 days    Potassium  Date Value Ref Range Status  06/15/2021 4.3 3.5 - 5.1 mmol/L Final         Passed - Last Heart Rate in normal range    Pulse Readings from Last 1 Encounters:  06/15/21 67         Passed - Valid encounter within last 6 months    Recent Outpatient Visits          4 months ago Controlled type 2 diabetes mellitus with stage 3 chronic kidney disease, with long-term current use of insulin (HCC)   [place] [facility] Storm Frisk, MD   7 months ago Diabetes mellitus type 2 in obese [facility])   [place] [facility] Storm Frisk, MD   11 months ago Diabetes mellitus type 2 in obese [facility])   [place] [facility] Hoy Register, MD   1 year ago Bilateral impacted cerumen   [place] [facility] [place], Odette Horns, MD   1 year ago Diabetes mellitus  type 2 in obese [facility])   [place] [facility] [place], [place], [place]             . omeprazole (PRILOSEC) 20 MG capsule 30 capsule 0    Sig: Take 1 capsule (20 mg total) by mouth daily.     Gastroenterology: Proton Pump Inhibitors Passed - 07/31/2021  5:56 PM      Passed - Valid encounter within last 12 months    Recent Outpatient Visits          4 months ago Controlled type 2 diabetes mellitus with stage 3 chronic kidney disease, with long-term current use of insulin [facility])   [place] [facility] Storm Frisk, MD   7 months ago Diabetes mellitus type 2 in obese [facility])   [place] [facility] Storm Frisk, MD   11 months ago Diabetes mellitus type 2 in obese [facility])   [place] [facility] Hoy Register, MD   1 year ago Bilateral impacted  cerumen   [place] [facility] [place], Odette Horns, MD   1 year ago Diabetes mellitus type 2 in obese [facility])   [place] [facility] Hoy Register, MD

## 2021-08-01 ENCOUNTER — Other Ambulatory Visit: Payer: Self-pay

## 2021-08-02 ENCOUNTER — Ambulatory Visit (INDEPENDENT_AMBULATORY_CARE_PROVIDER_SITE_OTHER): Payer: No Typology Code available for payment source | Admitting: Podiatry

## 2021-08-02 ENCOUNTER — Other Ambulatory Visit (HOSPITAL_COMMUNITY): Payer: Self-pay

## 2021-08-02 ENCOUNTER — Encounter: Payer: Self-pay | Admitting: Podiatry

## 2021-08-02 ENCOUNTER — Ambulatory Visit (INDEPENDENT_AMBULATORY_CARE_PROVIDER_SITE_OTHER): Payer: No Typology Code available for payment source

## 2021-08-02 ENCOUNTER — Other Ambulatory Visit: Payer: Self-pay

## 2021-08-02 DIAGNOSIS — L97529 Non-pressure chronic ulcer of other part of left foot with unspecified severity: Secondary | ICD-10-CM

## 2021-08-02 DIAGNOSIS — R609 Edema, unspecified: Secondary | ICD-10-CM

## 2021-08-02 DIAGNOSIS — M84374D Stress fracture, right foot, subsequent encounter for fracture with routine healing: Secondary | ICD-10-CM

## 2021-08-02 MED FILL — Sacubitril-Valsartan Tab 97-103 MG: ORAL | 30 days supply | Qty: 60 | Fill #0 | Status: CN

## 2021-08-02 NOTE — Progress Notes (Signed)
  Subjective:  Patient ID: [name], male    DOB: January 30, 1971,  MRN: [id number]  Chief Complaint  Patient presents with   Foot Pain    The right foot is always swelling    Foot Ulcer    The left foot is doing a lot better and I have been wearing the surgical shoe and that seems to help a lot    DOS: 04/20/21 Procedure: Wound debridement right with closure and metatarsal resection  50 y.o. male presents for follow-up. Hx confirmed as above.  Objective:  Physical Exam: tenderness at the surgical site, local edema noted and calf supple, nontender. Incision: Right lateral foot wound well healed, but with new local edema without warmth, redness, drainage. Left 5th metatarsal ulceration 1x0.5 with mild HPK, no no warmth,erythema, SOI Assessment:   1. Edema, unspecified type   2. Ulcer of left foot, unspecified ulcer stage (HCC)   3. Fracture, stress, metatarsal, right, with routine healing, subsequent encounter    Plan:  Patient was evaluated and treated and all questions answered.  Right foot edema; Fracture R 4th/5th Metatarsals -Repeat XR taken show stable fractures with signs of healing. Continue boot immobilization until edema resolves -Compression dressing applied.  Left foot wound -Wound debrided today. Dressed with SSD and band-aid. Continue daily.  Procedure: Selective Debridement of Wound Rationale: Removal of devitalized tissue from the wound to promote healing.  Pre-Debridement Wound Measurements: 1 cm x 0.5 cm x 0.1 cm  Post-Debridement Wound Measurements: same as pre-debridement. Type of Debridement: sharp selective Instrumentation: dermal curette, tissue nipper Tissue Removed: Devitalized soft-tissue Dressing: Dry, sterile, compression dressing. Disposition: Patient tolerated procedure well.   Return in about 2 weeks (around 08/16/2021) for Wound Care.

## 2021-08-08 ENCOUNTER — Other Ambulatory Visit: Payer: Self-pay

## 2021-08-08 MED FILL — Atorvastatin Calcium Tab 80 MG (Base Equivalent): ORAL | 30 days supply | Qty: 30 | Fill #3 | Status: AC

## 2021-08-08 MED FILL — Glipizide Tab 10 MG: ORAL | 30 days supply | Qty: 60 | Fill #2 | Status: AC

## 2021-08-09 ENCOUNTER — Other Ambulatory Visit: Payer: Self-pay

## 2021-08-10 ENCOUNTER — Other Ambulatory Visit: Payer: Self-pay | Admitting: Critical Care Medicine

## 2021-08-10 ENCOUNTER — Other Ambulatory Visit: Payer: Self-pay

## 2021-08-10 MED ORDER — SPIRONOLACTONE 50 MG PO TABS
50.0000 mg | ORAL_TABLET | Freq: Every day | ORAL | 2 refills | Status: DC
  Filled 2021-08-10: qty 30, 30d supply, fill #0
  Filled 2021-09-06: qty 30, 30d supply, fill #1
  Filled 2021-10-05: qty 30, 30d supply, fill #2

## 2021-08-15 ENCOUNTER — Telehealth (HOSPITAL_COMMUNITY): Payer: Self-pay | Admitting: Vascular Surgery

## 2021-08-15 NOTE — Telephone Encounter (Signed)
Pt called to follow up on his CPAP , he states it has been several months since he has heard anything about getting this machine .. please advise

## 2021-08-16 ENCOUNTER — Other Ambulatory Visit: Payer: Self-pay

## 2021-08-16 ENCOUNTER — Encounter: Payer: Self-pay | Admitting: Podiatry

## 2021-08-16 ENCOUNTER — Other Ambulatory Visit (HOSPITAL_COMMUNITY): Payer: Self-pay

## 2021-08-16 ENCOUNTER — Ambulatory Visit (INDEPENDENT_AMBULATORY_CARE_PROVIDER_SITE_OTHER): Payer: No Typology Code available for payment source | Admitting: Podiatry

## 2021-08-16 DIAGNOSIS — L97425 Non-pressure chronic ulcer of left heel and midfoot with muscle involvement without evidence of necrosis: Secondary | ICD-10-CM

## 2021-08-16 DIAGNOSIS — E11621 Type 2 diabetes mellitus with foot ulcer: Secondary | ICD-10-CM | POA: Diagnosis not present

## 2021-08-16 DIAGNOSIS — M84374D Stress fracture, right foot, subsequent encounter for fracture with routine healing: Secondary | ICD-10-CM | POA: Diagnosis not present

## 2021-08-16 MED FILL — Sacubitril-Valsartan Tab 97-103 MG: ORAL | 30 days supply | Qty: 60 | Fill #0 | Status: AC

## 2021-08-16 NOTE — Progress Notes (Signed)
  Subjective:  Patient ID: [name], male    DOB: 1971/03/29,  MRN: [id number]  Chief Complaint  Patient presents with   Foot Pain    Right foot swells at night and during the day it does not and the left is doing ok and has drained some    DOS: 04/20/21 Procedure: Wound debridement right with closure and metatarsal resection  50 y.o. male presents for follow-up. Hx confirmed as above. States the right foot swelling goes up and down but never truly goes away.  Objective:  Physical Exam: tenderness at the surgical site, local edema noted and calf supple, nontender. Incision: Right lateral foot wound well healed, but with local edema without excessive warmth, redness, drainage. Left 5th metatarsal ulceration 1x01 with mild HPK, no no warmth,erythema, SOI Assessment:   1. Diabetic ulcer of left midfoot associated with type 2 diabetes mellitus, with muscle involvement without evidence of necrosis (HCC)   2. Fracture, stress, metatarsal, right, with routine healing, subsequent encounter    Plan:  Patient was evaluated and treated and all questions answered.  Right foot edema; Fracture R 4th/5th Metatarsals -Continued swelling noted. Will get repeat XR next visit; consider MRI. Ddx Charcot, Metatarsal fracture w/o pathologic etiology; OM.  Left foot wound -Wound debrided. Given stalled wound would benefit from skin graft substitute to promote healing. Apligraf applied today.  Procedure: Application Skin Graft Substitute Rationale: Wound in need of advanced wound therapy to accelerate healing Pre-Debridement Wound Measurements: 1 cm x 1 cm x 0.2 cm  Post-Debridement Wound Measurements: same as pre-debridement. Type of Debridement: Selective Tissue Removed: Devitalized soft-tissue Instrumentation: 3 mm dermal curette Skin substitute: Apligraf    Lot #: [id number]    Expiration: 08/23/21 Hydration: none Secondary Dressing: mepitel, sterile gauze, kerlix, and ACE  bandage Disposition: Patient tolerated procedure well. Patient to remove dressing in 1 week, then apply silvadene and band-aid. F/u in 2 weeks for recheck.  No follow-ups on file.

## 2021-08-17 ENCOUNTER — Ambulatory Visit: Payer: No Typology Code available for payment source | Admitting: Podiatry

## 2021-08-22 NOTE — Telephone Encounter (Signed)
Called patient and informed him that he should call [employer] to learn his machine status at [phone].

## 2021-08-24 ENCOUNTER — Ambulatory Visit (HOSPITAL_COMMUNITY): Payer: No Typology Code available for payment source

## 2021-08-29 ENCOUNTER — Other Ambulatory Visit: Payer: Self-pay

## 2021-08-29 MED FILL — Evolocumab Subcutaneous Soln Auto-Injector 140 MG/ML: SUBCUTANEOUS | 28 days supply | Qty: 2 | Fill #3 | Status: AC

## 2021-08-31 ENCOUNTER — Ambulatory Visit (HOSPITAL_COMMUNITY)
Admission: RE | Admit: 2021-08-31 | Discharge: 2021-08-31 | Disposition: A | Discharge status: Home / Self Care | Payer: No Typology Code available for payment source | Source: Ambulatory Visit | Attending: Cardiology | Admitting: Cardiology

## 2021-08-31 ENCOUNTER — Other Ambulatory Visit: Payer: Self-pay

## 2021-08-31 ENCOUNTER — Other Ambulatory Visit: Payer: Self-pay | Admitting: Podiatry

## 2021-08-31 ENCOUNTER — Ambulatory Visit (INDEPENDENT_AMBULATORY_CARE_PROVIDER_SITE_OTHER): Payer: No Typology Code available for payment source

## 2021-08-31 ENCOUNTER — Ambulatory Visit (INDEPENDENT_AMBULATORY_CARE_PROVIDER_SITE_OTHER): Payer: No Typology Code available for payment source | Admitting: Podiatry

## 2021-08-31 DIAGNOSIS — M14671 Charcot's joint, right ankle and foot: Secondary | ICD-10-CM | POA: Diagnosis not present

## 2021-08-31 DIAGNOSIS — R609 Edema, unspecified: Secondary | ICD-10-CM | POA: Diagnosis not present

## 2021-08-31 DIAGNOSIS — I252 Old myocardial infarction: Secondary | ICD-10-CM | POA: Diagnosis not present

## 2021-08-31 DIAGNOSIS — E119 Type 2 diabetes mellitus without complications: Secondary | ICD-10-CM | POA: Diagnosis not present

## 2021-08-31 DIAGNOSIS — E11621 Type 2 diabetes mellitus with foot ulcer: Secondary | ICD-10-CM | POA: Diagnosis not present

## 2021-08-31 DIAGNOSIS — L97425 Non-pressure chronic ulcer of left heel and midfoot with muscle involvement without evidence of necrosis: Secondary | ICD-10-CM

## 2021-08-31 DIAGNOSIS — M79671 Pain in right foot: Secondary | ICD-10-CM

## 2021-08-31 DIAGNOSIS — M84374D Stress fracture, right foot, subsequent encounter for fracture with routine healing: Secondary | ICD-10-CM

## 2021-08-31 DIAGNOSIS — E785 Hyperlipidemia, unspecified: Secondary | ICD-10-CM | POA: Insufficient documentation

## 2021-08-31 DIAGNOSIS — I5042 Chronic combined systolic (congestive) and diastolic (congestive) heart failure: Secondary | ICD-10-CM | POA: Diagnosis not present

## 2021-08-31 DIAGNOSIS — I110 Hypertensive heart disease with heart failure: Secondary | ICD-10-CM | POA: Diagnosis not present

## 2021-08-31 LAB — ECHOCARDIOGRAM COMPLETE
Area-P 1/2: 3.56 cm2
MV M vel: 4.97 m/s
MV Peak grad: 98.8 mmHg
S' Lateral: 4.4 cm

## 2021-09-04 NOTE — Progress Notes (Signed)
Spoke with pt he is aware of results.

## 2021-09-06 ENCOUNTER — Other Ambulatory Visit: Payer: Self-pay | Admitting: Critical Care Medicine

## 2021-09-06 MED FILL — Glipizide Tab 10 MG: ORAL | 30 days supply | Qty: 60 | Fill #3 | Status: AC

## 2021-09-06 MED FILL — Carvedilol Tab 6.25 MG: ORAL | 30 days supply | Qty: 60 | Fill #5 | Status: AC

## 2021-09-06 MED FILL — Atorvastatin Calcium Tab 80 MG (Base Equivalent): ORAL | 30 days supply | Qty: 30 | Fill #4 | Status: AC

## 2021-09-06 NOTE — Telephone Encounter (Signed)
Requested medications are due for refill today yes  Requested medications are on the active medication list yes  Last refill 08/10/21  Last visit 02/2021  Future visit scheduled No   Notes to clinic Has already had a curtesy refill and there is no upcoming appointment scheduled.

## 2021-09-07 ENCOUNTER — Other Ambulatory Visit: Payer: Self-pay

## 2021-09-07 ENCOUNTER — Other Ambulatory Visit: Payer: Self-pay | Admitting: Critical Care Medicine

## 2021-09-07 ENCOUNTER — Ambulatory Visit (HOSPITAL_COMMUNITY)
Admission: RE | Admit: 2021-09-07 | Discharge: 2021-09-07 | Disposition: A | Discharge status: Home / Self Care | Payer: No Typology Code available for payment source | Source: Ambulatory Visit | Attending: Cardiovascular Disease | Admitting: Cardiovascular Disease

## 2021-09-07 DIAGNOSIS — E11621 Type 2 diabetes mellitus with foot ulcer: Secondary | ICD-10-CM

## 2021-09-07 DIAGNOSIS — L97421 Non-pressure chronic ulcer of left heel and midfoot limited to breakdown of skin: Secondary | ICD-10-CM

## 2021-09-07 NOTE — Telephone Encounter (Signed)
Copied from CRM [id number]. Topic: Quick Communication - Rx Refill/Question >> Sep 07, 2021  2:13 PM Jaquita Rector A wrote: Medication: omeprazole (PRILOSEC) 20 MG capsule  Has the patient contacted their pharmacy? Yes.   (Agent: If no, request that the patient contact the pharmacy for the refill.) (Agent: If yes, when and what did the pharmacy advise?)  Preferred Pharmacy (with phone number or street name): [facility]  Phone:  [phone] Fax:  [phone]     Agent: Please be advised that RX refills may take up to 3 business days. We ask that you follow-up with your pharmacy.

## 2021-09-08 NOTE — Telephone Encounter (Signed)
Requested medication (s) are due for refill today: yes has 1 pill left  Requested medication (s) are on the active medication list: yes  Last refill:  07/31/21 #30 courtesy RF  Future visit scheduled: yes 10/31/21  Notes to clinic:  pt would like to have enough refill to last until appt.//pt has already received 30 day courtesy RF Note on previous refill "Must have office visit for refill."   Requested Prescriptions  Pending Prescriptions Disp Refills   omeprazole (PRILOSEC) 20 MG capsule 30 capsule 0    Sig: Take 1 capsule (20 mg total) by mouth daily.     Gastroenterology: Proton Pump Inhibitors Passed - 09/07/2021  6:30 PM      Passed - Valid encounter within last 12 months    Recent Outpatient Visits           5 months ago Controlled type 2 diabetes mellitus with stage 3 chronic kidney disease, with long-term current use of insulin (HCC)   [name] [facility] Storm Frisk, MD   8 months ago Diabetes mellitus type 2 in obese [facility])   [place] [facility] Storm Frisk, MD   1 year ago Diabetes mellitus type 2 in obese [facility])   [place] [facility] Hoy Register, MD   1 year ago Bilateral impacted cerumen   [place] [facility] Hoy Register, MD   1 year ago Diabetes mellitus type 2 in obese [facility])   [place] [facility] Hoy Register, MD       Future Appointments             In 1 month D[place] Charlcie Cradle, MD [facility]

## 2021-09-10 NOTE — Progress Notes (Signed)
  Subjective:  Patient ID: [name], male    DOB: [birth year],  MRN: [id number]  Chief Complaint  Patient presents with   Wound Check    Denies fever/chills/nausea/vomiting. Swelling in right foot with broken bones. No new concerns.   DOS: 04/20/21 Procedure: Wound debridement right with closure and metatarsal resection  50 y.o. male presents for follow-up. Hx confirmed as above.  Objective:  Physical Exam: tenderness at the surgical site, local edema noted and calf supple, nontender. Incision: Right lateral foot wound well healed, but with local edema without excessive warmth, redness, drainage. Left 5th metatarsal ulceration 0.3*0.2 with mild HPK, no no warmth,erythema, SOI Assessment:   1. Charcot's joint of foot, right   2. Diabetic ulcer of left midfoot associated with type 2 diabetes mellitus, with muscle involvement without evidence of necrosis (HCC)   3. Edema, unspecified type    Plan:  Patient was evaluated and treated and all questions answered.  Right foot edema; Fracture R 4th/5th Metatarsals -Repeat x-rays show midfoot fractures consistent with Charcot rather than osteomyelitis.  Likely explains chronic redness and edema as was previous differential diagnosis.  We will continue to immobilize in cam walker boot as much the patient can.  He is wearing his normal shoe for driving purposes only  Left foot wound -Healing well, dressed with SSD and band-aid.  No follow-ups on file.

## 2021-09-11 ENCOUNTER — Other Ambulatory Visit: Payer: Self-pay

## 2021-09-11 MED ORDER — OMEPRAZOLE 20 MG PO CPDR
20.0000 mg | DELAYED_RELEASE_CAPSULE | Freq: Every day | ORAL | 0 refills | Status: DC
  Filled 2021-09-11: qty 30, 30d supply, fill #0

## 2021-09-11 NOTE — Telephone Encounter (Signed)
Pt called back in to check refill status, pt does have an appt in November but can not wait until then, and requested if he could get a refill to hold him until appt. Please advise

## 2021-09-12 ENCOUNTER — Other Ambulatory Visit: Payer: Self-pay

## 2021-09-14 ENCOUNTER — Encounter (HOSPITAL_COMMUNITY): Payer: Self-pay | Admitting: Cardiology

## 2021-09-14 ENCOUNTER — Other Ambulatory Visit: Payer: Self-pay

## 2021-09-14 ENCOUNTER — Ambulatory Visit (HOSPITAL_COMMUNITY)
Admission: RE | Admit: 2021-09-14 | Discharge: 2021-09-14 | Disposition: A | Discharge status: Home / Self Care | Payer: No Typology Code available for payment source | Source: Ambulatory Visit | Attending: Cardiology | Admitting: Cardiology

## 2021-09-14 ENCOUNTER — Other Ambulatory Visit (HOSPITAL_COMMUNITY): Payer: No Typology Code available for payment source

## 2021-09-14 ENCOUNTER — Other Ambulatory Visit (HOSPITAL_COMMUNITY): Payer: Self-pay

## 2021-09-14 VITALS — BP 130/80 | HR 55 | Wt 270.0 lb

## 2021-09-14 DIAGNOSIS — I252 Old myocardial infarction: Secondary | ICD-10-CM | POA: Diagnosis not present

## 2021-09-14 DIAGNOSIS — I255 Ischemic cardiomyopathy: Secondary | ICD-10-CM | POA: Diagnosis not present

## 2021-09-14 DIAGNOSIS — L97409 Non-pressure chronic ulcer of unspecified heel and midfoot with unspecified severity: Secondary | ICD-10-CM | POA: Diagnosis not present

## 2021-09-14 DIAGNOSIS — E669 Obesity, unspecified: Secondary | ICD-10-CM | POA: Insufficient documentation

## 2021-09-14 DIAGNOSIS — Z8249 Family history of ischemic heart disease and other diseases of the circulatory system: Secondary | ICD-10-CM | POA: Insufficient documentation

## 2021-09-14 DIAGNOSIS — Z7984 Long term (current) use of oral hypoglycemic drugs: Secondary | ICD-10-CM | POA: Insufficient documentation

## 2021-09-14 DIAGNOSIS — I5042 Chronic combined systolic (congestive) and diastolic (congestive) heart failure: Secondary | ICD-10-CM

## 2021-09-14 DIAGNOSIS — Z955 Presence of coronary angioplasty implant and graft: Secondary | ICD-10-CM | POA: Insufficient documentation

## 2021-09-14 DIAGNOSIS — Z89422 Acquired absence of other left toe(s): Secondary | ICD-10-CM | POA: Diagnosis not present

## 2021-09-14 DIAGNOSIS — I110 Hypertensive heart disease with heart failure: Secondary | ICD-10-CM | POA: Insufficient documentation

## 2021-09-14 DIAGNOSIS — E119 Type 2 diabetes mellitus without complications: Secondary | ICD-10-CM | POA: Diagnosis not present

## 2021-09-14 DIAGNOSIS — Z794 Long term (current) use of insulin: Secondary | ICD-10-CM | POA: Diagnosis not present

## 2021-09-14 DIAGNOSIS — Z79899 Other long term (current) drug therapy: Secondary | ICD-10-CM | POA: Diagnosis not present

## 2021-09-14 DIAGNOSIS — E785 Hyperlipidemia, unspecified: Secondary | ICD-10-CM

## 2021-09-14 DIAGNOSIS — Z7982 Long term (current) use of aspirin: Secondary | ICD-10-CM | POA: Diagnosis not present

## 2021-09-14 DIAGNOSIS — I2510 Atherosclerotic heart disease of native coronary artery without angina pectoris: Secondary | ICD-10-CM | POA: Diagnosis not present

## 2021-09-14 DIAGNOSIS — Z6831 Body mass index (BMI) 31.0-31.9, adult: Secondary | ICD-10-CM | POA: Insufficient documentation

## 2021-09-14 DIAGNOSIS — G4733 Obstructive sleep apnea (adult) (pediatric): Secondary | ICD-10-CM | POA: Insufficient documentation

## 2021-09-14 DIAGNOSIS — I5022 Chronic systolic (congestive) heart failure: Secondary | ICD-10-CM | POA: Insufficient documentation

## 2021-09-14 LAB — LIPID PANEL
Cholesterol: 102 mg/dL (ref 0–200)
HDL: 38 mg/dL — ABNORMAL LOW (ref >40)
LDL Cholesterol: 45 mg/dL (ref 0–99)
Total CHOL/HDL Ratio: 2.7 RATIO
Triglycerides: 93 mg/dL (ref <150)
VLDL: 19 mg/dL (ref 0–40)

## 2021-09-14 LAB — BASIC METABOLIC PANEL
Anion gap: 8 (ref 5–15)
BUN: 24 mg/dL — ABNORMAL HIGH (ref 6–20)
CO2: 25 mmol/L (ref 22–32)
Calcium: 9.3 mg/dL (ref 8.9–10.3)
Chloride: 105 mmol/L (ref 98–111)
Creatinine, Ser: 1.16 mg/dL (ref 0.61–1.24)
GFR, Estimated: >60 mL/min (ref >60)
Glucose, Bld: 146 mg/dL — ABNORMAL HIGH (ref 70–99)
Potassium: 4.2 mmol/L (ref 3.5–5.1)
Sodium: 138 mmol/L (ref 135–145)

## 2021-09-14 LAB — DIGOXIN LEVEL: Digoxin Level: 0.5 ng/mL — ABNORMAL LOW (ref 0.8–2.0)

## 2021-09-14 MED FILL — Sacubitril-Valsartan Tab 97-103 MG: ORAL | 30 days supply | Qty: 60 | Fill #1 | Status: AC

## 2021-09-14 NOTE — Progress Notes (Signed)
Advanced Heart Failure Clinic Note  PCP: Dr. Jarold Song Cardiology: Dr. Percival Spanish HF Cardiology: Dr. Loretta Plume Sooy is a 50 y.o. male with history of CAD and ischemic cardiomyopathy.  He initially had NSTEMI in 10/12 with DES to proximal LAD. At that time, EF was noted to be down to 15%.  Over time, EF improved to 50% (3/13 echo).    Echo in 10/17 showed EF back down to 10% with severe functional MR.  He was taken for cardiac cath.  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  He had DES x 2 to LCx/OM2 and had a staged procedure with DES to distal RCA/ostial PDA.  RHC showed elevated filling pressure and low cardiac output.  While in the hospital, he was on milrinone gtt and was diuresed.  Eventually, we were able to titrate him off milrinone and discharged him home on a po regimen.   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   Echo in 8/20 showed EF in the 35% range with trivial MR.    He had amputation of left 5th toe due to osteomyelitis.   Echo in 9/22 showed EF 40-45% with moderate LVH.  ABIs in 9/22 were normal.   Patient returns for followup of CHF and CAD.  He has a Charcot joint right foot.  Ulcer on left foot has healed.  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  He had been losing weight, but weight now up again as he has not been as active due to foot problems. Still recommended that he stay off his feet as much as possible to allow healing.  No lightheadedness.  No significant exertional dyspnea.  No chest pain.  No orthopnea/PND.  He is waiting to get CPAP.   Labs (7/19): K 4.1, creatinine 1.5 Labs (9/20): LDL 114 Labs (1/21): K 4, creatinine 1.3 Labs (12/21): LDL 45 Labs (4/22): K 4.3, creatinine 1.19 Labs (6/22): BNP 49.5, K 4.3, creatinine 1.19  Review of systems complete and found to be negative unless listed in HPI.   PMH:  1. CAD: NSTEMI 10/12 with DES to proximal  LAD.  - LHC (10/25/16): subtotal occlusion LCx/OM2, 80% dRCA/ostial PCA => DES x 2 to LCx/OM2.  On 10/30, staged procedure with DES to dRCA/ostial PDA.   2. Chronic systolic CHF: Ischemic cardiomyopathy.   - 11/12 echo with EF 15%. - Echo (3/13) with EF 50% - Echo (10/16) with EF 35-40% - Echo (10/17) with EF 10%, severe MR (appears functional).  - RHC (10/17) with mean RA 20, PA 73/36, mean PCWP 28, CI 1.5 Fick/1.74 thermo - Echo (8/20) with EF 35% by my review, trivial MR.  - Echo (9/22): EF 40-45%, moderate LVH.  3. Type II diabetes 4. HTN 5. Hyperlipidemia 6. Obesity 7. OSA: Suspected.  8. 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  9. Charcot joint right foot.  10. ABIs normal in 9/22.   Social History   Socioeconomic History   Marital status: Single    Spouse name: Not on file   Number of children: Not on file   Years of education: Not on file   Highest education level: Not on file  Occupational History   Not on file  Tobacco Use   Smoking status: Never   Smokeless tobacco: Never  Vaping Use   Vaping Use: Never used  Substance and Sexual Activity   Alcohol use: Not  Currently    Alcohol/week: 1.0 standard drink    Types: 1 Shots of liquor per week   Drug use: No   Sexual activity: Yes    Birth control/protection: None  Other Topics Concern   Not on file  Social History Narrative   Not on file   Social Determinants of Health   Financial Resource Strain: Not on file  Food Insecurity: Not on file  Transportation Needs: Not on file  Physical Activity: Not on file  Stress: Not on file  Social Connections: Not on file  Intimate Partner Violence: Not on file   Family History  Problem Relation Age of Onset   Heart disease Father    Heart disease Mother        MOTHER HAD CABG     Current Outpatient Medications  Medication Sig Dispense Refill   acetaminophen (TYLENOL) 500 MG tablet Take 500 mg by mouth every 6 (six) hours as needed.     aspirin  81 MG chewable tablet Chew 1 tablet (81 mg total) by mouth daily. 30 tablet 6   atorvastatin (LIPITOR) 80 MG tablet TAKE 1 TABLET (80 MG TOTAL) BY MOUTH EVERY EVENING. 30 tablet 6   Blood Glucose Monitoring Suppl (ONETOUCH VERIO REFLECT) w/Device KIT USE TO CHECK BLOOD SUGAR 3 TIMES DAILY. 1 kit 0   Blood Glucose Monitoring Suppl (ONETOUCH VERIO) w/Device KIT Use to check blood sugar 3 times daily. 1 kit 0   carvedilol (COREG) 6.25 MG tablet TAKE 1 TABLET (6.25 MG TOTAL) BY MOUTH 2 (TWO) TIMES DAILY WITH A MEAL. 60 tablet 6   dapagliflozin propanediol (FARXIGA) 10 MG TABS tablet TAKE 1 TABLET (10 MG TOTAL) BY MOUTH DAILY BEFORE BREAKFAST. 30 tablet 6   Evolocumab 140 MG/ML SOAJ INJECT 1 PEN INTO THE SKIN EVERY 14 (FOURTEEN) DAYS. 2 mL 11   furosemide (LASIX) 40 MG tablet TAKE 1 TABLET (40 MG TOTAL) BY MOUTH DAILY. 30 tablet 3   glipiZIDE (GLUCOTROL) 10 MG tablet TAKE 1 TABLET (10 MG TOTAL) BY MOUTH 2 (TWO) TIMES DAILY BEFORE A MEAL. 60 tablet 6   glucose blood test strip USE AS DIRECTED 3 TIMES DAILY (Patient taking differently: USE AS DIRECTED 3 TIMES DAILY) 100 strip 12   insulin glargine (LANTUS SOLOSTAR) 100 UNIT/ML Solostar Pen INJECT 20 UNITS INTO THE SKIN DAILY. 6 mL 3   Insulin Pen Needle 31G X 5 MM MISC USE TO INJECT INSULIN AT BEDTIME. 100 each 6   Lancets (ONETOUCH DELICA PLUS [id number]) MISC USE TO CHECK BLOOD SUGAR 3 TIMES DAILY. 100 each 2   omeprazole (PRILOSEC) 20 MG capsule Take 1 capsule (20 mg total) by mouth daily. 30 capsule 0   OneTouch Delica Lancets 62X MISC Use to check blood sugar 3 times daily. 100 each 2   sacubitril-valsartan (ENTRESTO) 97-103 MG TAKE 1 TABLET BY MOUTH 2 (TWO) TIMES DAILY. 180 tablet 3   silver sulfADIAZINE (SILVADENE) 1 % cream APPLY 1 APPLICATION TOPICALLY DAILY. 50 g 0   spironolactone (ALDACTONE) 50 MG tablet Take 1 tablet (50 mg total) by mouth daily. 30 tablet 2   No current facility-administered medications for this encounter.    Facility-Administered Medications Ordered in Other Encounters  Medication Dose Route Frequency Provider Last Rate Last Admin   vancomycin (VANCOCIN) powder    PRN Evelina Bucy, DPM   1,000 mg at 01/24/21 1411   BP 130/80   Pulse (!) 55   Wt 122.5 kg (270 lb)   SpO2 99%  BMI 31.20 kg/m  Wt Readings from Last 3 Encounters:  09/14/21 122.5 kg (270 lb)  06/15/21 119.4 kg (263 lb 4 oz)  04/27/21 115.8 kg (255 lb 6.4 oz)   General: NAD Neck: Thick. No JVD, no thyromegaly or thyroid nodule.  Lungs: Clear to auscultation bilaterally with normal respiratory effort. CV: Nondisplaced PMI.  Heart regular S1/S2, no S3/S4, no murmur.  No peripheral edema.  No carotid bruit.  Difficult to palpate pedal pulses.  Abdomen: Soft, nontender, no hepatosplenomegaly, no distention.  Skin: Intact without lesions or rashes.  Neurologic: Alert and oriented x 3.  Psych: Normal affect. Extremities: No clubbing or cyanosis.  HEENT: Normal.   Assessment/Plan: 1. CAD: Cath in 10/17 after EF found to have significantly dropped.  He had DES x 2 to the LCx system and DES x 1 to RCA.  No chest pain.  - Continue ASA 81 daily.   - Continue atorvastatin 40 mg daily, check lipids today.  2. Chronic systolic CHF: EF 26% on 71/24 echo, down from 35-40% in 10/16.  Ischemic cardiomyopathy.  Now s/p PCI as above.  He had low output HF with volume overload on RHC done in 10/17.  He required milrinone and diuresis while in the hospital.  We were able to titrate off milrinone.  Echo in 8/20 with EF 35%. Echo in 9/22 showed EF up to 40-45%.  NYHA class II symptoms with no significant volume overload.  Weight up today but suspect due to lack of exercise with foot problems.  - Continue Lasix 40 mg daily, BMET today.   - Continue spironolactone 50 mg daily.  - Continue Coreg 6.25 mg bid, he has not tolerated increase and HR in 50s.  - Continue Entresto 97/103 bid.  - Continue dapagliflozin 10 mg daily.    - With EF up to  40-45% and minimal symptoms, think he can stop digoxin.   - He appears to be out of ICD range.    3. OSA: Waiting for CPAP.  4. Foot ulcers: Thought to be related to diabetes.  Difficult to palpate pedal pulses. ABIs in 9/22 were normal.  5. Obesity: Continue to work on weight loss, BMI 31.   Followup in 6 months.   Loralie Champagne 09/16/2021

## 2021-09-14 NOTE — Patient Instructions (Signed)
Stop Digoxin  Labs done today, your results will be available in MyChart, we will contact you for abnormal readings.  Please call our office in February 2023 to schedule your follow up appointment  Do the following things EVERYDAY: Weigh yourself in the morning before breakfast. Write it down and keep it in a log. Take your medicines as prescribed Eat low salt foods--Limit salt (sodium) to 2000 mg per day.  Stay as active as you can everyday Limit all fluids for the day to less than 2 liters  If you have any questions or concerns before your next appointment please send [place] a message through [place] or call our office at [phone].    TO LEAVE A MESSAGE FOR THE NURSE SELECT OPTION 2, PLEASE LEAVE A MESSAGE INCLUDING: YOUR NAME DATE OF BIRTH CALL BACK NUMBER REASON FOR CALL**this is important as we prioritize the call backs  YOU WILL RECEIVE A CALL BACK THE SAME DAY AS LONG AS YOU CALL BEFORE 4:00 PM  At the [facility], you and your health needs are our priority. As part of our continuing mission to provide you with exceptional heart care, we have created designated Provider Care Teams. These Care Teams include your primary Cardiologist (physician) and Advanced Practice Providers (APPs- Physician Assistants and Nurse Practitioners) who all work together to provide you with the care you need, when you need it.   You may see any of the following providers on your designated Care Team at your next follow up: Dr Arvilla Meres Dr Marca Ancona Dr Brandon Melnick, NP Robbie Lis, [place] Mikki Santee Karle Plumber, PharmD   Please be sure to bring in all your medications bottles to every appointment.

## 2021-09-17 ENCOUNTER — Ambulatory Visit (INDEPENDENT_AMBULATORY_CARE_PROVIDER_SITE_OTHER): Payer: No Typology Code available for payment source | Admitting: Podiatry

## 2021-09-17 ENCOUNTER — Ambulatory Visit (INDEPENDENT_AMBULATORY_CARE_PROVIDER_SITE_OTHER): Payer: No Typology Code available for payment source

## 2021-09-17 ENCOUNTER — Other Ambulatory Visit: Payer: Self-pay

## 2021-09-17 DIAGNOSIS — M84374D Stress fracture, right foot, subsequent encounter for fracture with routine healing: Secondary | ICD-10-CM

## 2021-09-17 DIAGNOSIS — M14671 Charcot's joint, right ankle and foot: Secondary | ICD-10-CM | POA: Diagnosis not present

## 2021-09-17 NOTE — Progress Notes (Signed)
  Subjective:  Patient ID: Eathan Groman, male    DOB: 07/27/1971,  MRN: [id number]  Chief Complaint  Patient presents with   Wound Check    F/U Lt wound check -pt states," doing better, looks better" - no redness/swelling/drainage - w/ scabby Tx; silvadne and bandiad   Foot Injury    F/U Rt fx -pt states," feels the same to me.": - same with swelling, worse after work - no pain Tx; sx shoe   DOS: 04/20/21 Procedure: Wound debridement right with closure and metatarsal resection  50 y.o. male presents for follow-up. Hx confirmed as above.  Objective:  Physical Exam: tenderness at the surgical site, local edema noted and calf supple, nontender. Incision: Right lateral foot wound well healed, but with local edema without excessive warmth, redness, drainage. Left 5th metatarsal ulceration pinpoint with mild HPK, no no warmth,erythema, SOI Assessment:   1. Fracture, stress, metatarsal, right, with routine healing, subsequent encounter   2. Charcot's joint of foot, right    Plan:  Patient was evaluated and treated and all questions answered.  Right foot edema; Fracture R 4th/5th Metatarsals - repeat x-rays show healing of the fifth metatarsal fracture area with some displacement of the second and third metatarsals.  Appears to be healing well.  As long as this consolidates I think you will of no issues with his foot.  Continue protected weightbearing and limited activity  Left foot wound -Healing well, dressed with SSD and band-aid.  No follow-ups on file.

## 2021-09-18 ENCOUNTER — Ambulatory Visit: Payer: No Typology Code available for payment source | Admitting: Podiatry

## 2021-10-01 ENCOUNTER — Other Ambulatory Visit: Payer: Self-pay

## 2021-10-01 ENCOUNTER — Ambulatory Visit (INDEPENDENT_AMBULATORY_CARE_PROVIDER_SITE_OTHER): Payer: No Typology Code available for payment source | Admitting: Podiatry

## 2021-10-01 DIAGNOSIS — M14671 Charcot's joint, right ankle and foot: Secondary | ICD-10-CM | POA: Diagnosis not present

## 2021-10-01 DIAGNOSIS — E11621 Type 2 diabetes mellitus with foot ulcer: Secondary | ICD-10-CM

## 2021-10-01 DIAGNOSIS — L97425 Non-pressure chronic ulcer of left heel and midfoot with muscle involvement without evidence of necrosis: Secondary | ICD-10-CM

## 2021-10-02 ENCOUNTER — Other Ambulatory Visit: Payer: Self-pay

## 2021-10-05 ENCOUNTER — Telehealth: Payer: Self-pay

## 2021-10-05 ENCOUNTER — Other Ambulatory Visit: Payer: Self-pay | Admitting: Critical Care Medicine

## 2021-10-05 ENCOUNTER — Other Ambulatory Visit: Payer: Self-pay | Admitting: Cardiology

## 2021-10-05 ENCOUNTER — Other Ambulatory Visit (HOSPITAL_COMMUNITY): Payer: Self-pay | Admitting: *Deleted

## 2021-10-05 ENCOUNTER — Other Ambulatory Visit: Payer: Self-pay

## 2021-10-05 DIAGNOSIS — E669 Obesity, unspecified: Secondary | ICD-10-CM

## 2021-10-05 DIAGNOSIS — E1169 Type 2 diabetes mellitus with other specified complication: Secondary | ICD-10-CM

## 2021-10-05 MED ORDER — EMPAGLIFLOZIN 10 MG PO TABS
10.0000 mg | ORAL_TABLET | Freq: Every day | ORAL | 3 refills | Status: DC
  Filled 2021-10-05 – 2021-10-08 (×2): qty 30, 30d supply, fill #0

## 2021-10-05 MED ORDER — DAPAGLIFLOZIN PROPANEDIOL 10 MG PO TABS
ORAL_TABLET | Freq: Every day | ORAL | 6 refills | Status: DC
  Filled 2021-10-05: qty 30, fill #0

## 2021-10-05 MED ORDER — OMEPRAZOLE 20 MG PO CPDR
20.0000 mg | DELAYED_RELEASE_CAPSULE | Freq: Every day | ORAL | 0 refills | Status: DC
  Filled 2021-10-05: qty 30, 30d supply, fill #0

## 2021-10-05 MED FILL — Dapagliflozin Propanediol Tab 10 MG (Base Equivalent): ORAL | 30 days supply | Qty: 30 | Fill #0 | Status: CN

## 2021-10-05 MED FILL — Evolocumab Subcutaneous Soln Auto-Injector 140 MG/ML: SUBCUTANEOUS | 28 days supply | Qty: 2 | Fill #4 | Status: AC

## 2021-10-05 MED FILL — Carvedilol Tab 6.25 MG: ORAL | 30 days supply | Qty: 60 | Fill #6 | Status: AC

## 2021-10-05 NOTE — Telephone Encounter (Signed)
Pharmacist calling from [facility] pharmacy requesting a change in pt's medication [name], pt's insurance will not cover the medication and pharmacist would like to know if Dr. Shirlee Latch would change medication to an alternative, Jardiance. Pharmacist would like a call back concerning this matter at [phone]. Please address

## 2021-10-05 NOTE — Telephone Encounter (Signed)
Requested medication (s) are due for refill today - yes  Requested medication (s) are on the active medication list -yes  Future visit scheduled -yes  Last refill: 09/11/21 #30  Notes to clinic: Request RF: last Rx- courtesy has notes- must have office visit for refills- patient requesting extension until scheduled appointment-10/31/21  Requested Prescriptions  Pending Prescriptions Disp Refills   omeprazole (PRILOSEC) 20 MG capsule 30 capsule 0    Sig: Take 1 capsule (20 mg total) by mouth daily.     Gastroenterology: Proton Pump Inhibitors Passed - 10/05/2021  8:38 AM      Passed - Valid encounter within last 12 months    Recent Outpatient Visits           6 months ago Controlled type 2 diabetes mellitus with stage 3 chronic kidney disease, with long-term current use of insulin (HCC)   [place] [facility] Storm Frisk, MD   9 months ago Diabetes mellitus type 2 in obese [facility])   [place] [facility] Storm Frisk, MD   1 year ago Diabetes mellitus type 2 in obese [facility])   [place] [facility] Hoy Register, MD   1 year ago Bilateral impacted cerumen   [place] [facility] Hoy Register, MD   1 year ago Diabetes mellitus type 2 in obese [facility])   [place] [facility] Hoy Register, MD       Future Appointments             In 3 weeks Storm Frisk, MD [facility]               Requested Prescriptions  Pending Prescriptions Disp Refills   omeprazole (PRILOSEC) 20 MG capsule 30 capsule 0    Sig: Take 1 capsule (20 mg total) by mouth daily.     Gastroenterology: Proton Pump Inhibitors Passed - 10/05/2021  8:38 AM      Passed - Valid encounter within last 12 months    Recent Outpatient Visits           6 months ago Controlled type 2 diabetes mellitus with stage 3 chronic kidney disease, with long-term current use of  insulin (HCC)   [place] [facility] Storm Frisk, MD   9 months ago Diabetes mellitus type 2 in obese [facility])   [place] [facility] Storm Frisk, MD   1 year ago Diabetes mellitus type 2 in obese [facility])   [place] [facility] Hoy Register, MD   1 year ago Bilateral impacted cerumen   [place] [facility] Hoy Register, MD   1 year ago Diabetes mellitus type 2 in obese [facility])    [facility] Hoy Register, MD       Future Appointments             In 3 weeks Delford Field Charlcie Cradle, MD [facility]

## 2021-10-05 NOTE — Telephone Encounter (Signed)
Spoke with  pharmacy. New script sent.

## 2021-10-08 ENCOUNTER — Other Ambulatory Visit: Payer: Self-pay

## 2021-10-08 NOTE — Progress Notes (Signed)
  Subjective:  Patient ID: [name], male    DOB: May 20, 1971,  MRN: [id number]  Chief Complaint  Patient presents with   Wound Check    F/U Lt wound check -pt states wound is about healed - no swelling -w/ a small irritated/rash spot from bandiad tx: bandaid with SSD    Foot Injury    F/u Rt fx -pt states," same with swelling - no pain xt: boot   DOS: 04/20/21 Procedure: Wound debridement right with closure and metatarsal resection  50 y.o. male presents for follow-up. Hx confirmed as above.  Objective:  Physical Exam: tenderness at the surgical site, local edema noted and calf supple, nontender. Incision: Right lateral foot wound well healed, but with local edema without excessive warmth, redness, drainage. Left 5th metatarsal ulceration appears healed. Assessment:   1. Charcot's joint of foot, right   2. Diabetic ulcer of left midfoot associated with type 2 diabetes mellitus, with muscle involvement without evidence of necrosis [facility])    Plan:  Patient was evaluated and treated and all questions answered.  Right foot edema; Fracture R 4th/5th Metatarsals; Charcot - Stable. Continue boot as much as possible. Still warm and erythematous await full resolution before transition to normal shoegear.  Left foot wound -Appears healed.  No follow-ups on file.

## 2021-10-16 ENCOUNTER — Other Ambulatory Visit: Payer: Self-pay

## 2021-10-16 MED FILL — Atorvastatin Calcium Tab 80 MG (Base Equivalent): ORAL | 30 days supply | Qty: 30 | Fill #5 | Status: AC

## 2021-10-16 MED FILL — Glipizide Tab 10 MG: ORAL | 30 days supply | Qty: 60 | Fill #4 | Status: AC

## 2021-10-18 ENCOUNTER — Ambulatory Visit (INDEPENDENT_AMBULATORY_CARE_PROVIDER_SITE_OTHER): Payer: No Typology Code available for payment source | Admitting: Podiatry

## 2021-10-18 ENCOUNTER — Ambulatory Visit (INDEPENDENT_AMBULATORY_CARE_PROVIDER_SITE_OTHER): Payer: No Typology Code available for payment source

## 2021-10-18 DIAGNOSIS — M14671 Charcot's joint, right ankle and foot: Secondary | ICD-10-CM

## 2021-10-21 MED FILL — Sacubitril-Valsartan Tab 97-103 MG: ORAL | 30 days supply | Qty: 60 | Fill #2 | Status: AC

## 2021-10-22 ENCOUNTER — Telehealth (HOSPITAL_COMMUNITY): Payer: Self-pay

## 2021-10-22 ENCOUNTER — Other Ambulatory Visit: Payer: Self-pay

## 2021-10-22 ENCOUNTER — Other Ambulatory Visit (HOSPITAL_COMMUNITY): Payer: Self-pay

## 2021-10-22 MED FILL — Insulin Pen Needle 31 G X 5 MM (1/5" or 3/16"): 25 days supply | Qty: 100 | Fill #0 | Status: AC

## 2021-10-22 NOTE — Telephone Encounter (Signed)
Stop jardiance and f/u w/ PharmD in 1 week.

## 2021-10-22 NOTE — Telephone Encounter (Addendum)
Patient advised and verbalized understanding. Med list updated, pharmacy appt scheduled, allergy list updated.

## 2021-10-22 NOTE — Addendum Note (Signed)
Addended by: Rob Bunting R on: 10/22/2021 05:06 PM   Modules accepted: Orders

## 2021-10-22 NOTE — Telephone Encounter (Signed)
Patient reports that since starting the jardiance about 2-3 weeks ago he has developed a rash on the bottom of his stomach, arms,back. Please advise

## 2021-10-23 ENCOUNTER — Other Ambulatory Visit: Payer: Self-pay

## 2021-10-24 ENCOUNTER — Other Ambulatory Visit: Payer: Self-pay

## 2021-10-24 MED ORDER — SULFAMETHOXAZOLE-TRIMETHOPRIM 800-160 MG PO TABS
1.0000 | ORAL_TABLET | Freq: Two times a day (BID) | ORAL | 0 refills | Status: DC
  Filled 2021-10-24: qty 14, 7d supply, fill #0

## 2021-10-25 NOTE — Progress Notes (Signed)
  Subjective:  Patient ID: Jabez Molner, male    DOB: 21-May-1971,  MRN: [id number]  No chief complaint on file.  DOS: 04/20/21 Procedure: Wound debridement right with closure and metatarsal resection  50 y.o. male presents for follow-up. Hx confirmed as above.  States that the left foot is doing well and he is in a regular shoe without issue.  Right foot does not cause him pain but still swells  Objective:  Physical Exam: tenderness at the surgical site, local edema noted and calf supple, nontender. Incision: Right lateral foot wound well healed, but with local edema without excessive warmth, redness, drainage. Left 5th metatarsal ulceration remains healed. Assessment:   1. Charcot's joint of foot, right    Plan:  Patient was evaluated and treated and all questions answered.  Right foot edema; Fracture R 4th/5th Metatarsals; Charcot -Repeat x-rays taken mostly stable.  X-rays show signs of consolidation.  He does have some continued arch drop but he does not appear at present to have impending ulceration from this.  We did discuss the importance of wearing the boot and stay off of the foot is much as possible to prevent further arch drop while the remainder of the Charcot flare is active  Left foot wound -Remains healed.  No follow-ups on file.

## 2021-10-26 ENCOUNTER — Telehealth: Payer: Self-pay | Admitting: Critical Care Medicine

## 2021-10-26 NOTE — Telephone Encounter (Signed)
Copied from CRM [id number]. Topic: General - Other >> Oct 24, 2021 11:12 AM Pawlus, Maxine Glenn A wrote: Reason for CRM: Pt stated he was trying to upload pictures of his hand via MyChart to see what Dr Delford Field would recommend he put on it, possible infection or cream required according to the patient, please advise is there is another way pt can send over pictures since he is unable to do so through MyChart.   Patient has appt with Dr. Delford Field on 11/2

## 2021-10-29 ENCOUNTER — Inpatient Hospital Stay (HOSPITAL_COMMUNITY): Admission: RE | Admit: 2021-10-29 | Payer: No Typology Code available for payment source | Source: Ambulatory Visit

## 2021-10-29 NOTE — Progress Notes (Signed)
PCP: Dr. Venetia Night Cardiology: Dr. Antoine Poche HF Cardiology: Dr. Shirlee Latch  HPI:  Keedan Sample is a 50 y.o. male with history of CAD and ischemic cardiomyopathy.  He initially had NSTEMI in 09/2011 with DES to proximal LAD. At that time, EF was noted to be down to 15%.  Over time, EF improved to 50% (02/2012 echo).     Echo in 09/2016 showed EF back down to 10% with severe functional MR.  He was taken for cardiac cath.  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  He had DES x 2 to LCx/OM2 and had a staged procedure with DES to distal RCA/ostial PDA.  RHC showed elevated filling pressure and low cardiac output.  While in the hospital, he was on milrinone gtt and was diuresed.  Eventually, was able to be titrated off milrinone and discharged home on an oral regimen.    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    Echo in 07/2019 showed EF in the 35% range with trivial MR.     He had amputation of left 5th toe due to osteomyelitis.    Echo in 08/2021 showed EF 40-45% with moderate LVH.  ABIs in 08/2021 were normal.    Patient recently returned for followup of CHF and CAD.  He has a Charcot joint right foot.  Ulcer on left foot had healed.  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  He had been losing weight, but weight was up again as he has not been as active due to foot problems. Still recommended that he stay off his feet as much as possible to allow healing.  No lightheadedness.  No significant exertional dyspnea.  No chest pain.  No orthopnea/PND.  He was waiting to get CPAP.   Today he returns to HF clinic for pharmacist medication titration. At last visit with MD, Digoxin was discontinued due to improvement in EF. He was switched from [place] to [place] on 10/05/21 due to insurance preference. Unfortunately he developed a rash on the bottom of his stomach and the Jardiance was discontinued. Overall  he is feeling well today. Notes his rash has resolved but still has some itching after discontinuing Jardiance. Will try benadryl and OTC hydrocortisone cream. Will only have lightheadedness occassionally, which is not bothersome. No CP or palpitations. No SOB/DOE. Has been trying to lose weight, weight down ~1 lb from last visit. Takes furosemide 40 mg daily and has not needed any extra. No LEE, PND or orthopnea.    HF Medications: Carvedilol 6.25 mg BID Entresto 97/103 mg BID Spironolactone 50 mg daily Furosemide 40 mg daily  Has the patient been experiencing any side effects to the medications prescribed?  Yes - rash with Jardiance. Has been discontinued.  Does the patient have any problems obtaining medications due to transportation or finances?   No - has Actor of regimen: good Understanding of indications: good Potential of compliance: good Patient understands to avoid NSAIDs. Patient understands to avoid decongestants.    Pertinent Lab Values: 09/14/21: Serum creatinine 1.16, BUN 24, Potassium 4.2, Sodium 138  Vital Signs: Weight: 268.8 lbs (last clinic weight: 270 lbs) Blood pressure: 116/72  Heart rate: 81   Assessment/Plan: 1. CAD: Cath in 10/17 after EF found to have significantly dropped.  He had DES x 2 to the LCx system and DES x 1 to RCA.  No chest pain.  -  Continue ASA 81 daily.   - Continue atorvastatin 40 mg daily, good lipids 09/14/21 2. Chronic systolic CHF: EF 76% on 09/2016 echo, down from 35-40% in 09/2015.  Ischemic cardiomyopathy.  Now s/p PCI as above.  He had low output HF with volume overload on RHC done in 09/2016.  He required milrinone and diuresis while in the hospital.  Were able to titrate off milrinone.  Echo in 07/2019 with EF 35%. Echo in 08/2021 showed EF up to 40-45%.   -NYHA class II symptoms. Euvolemic on exam.  - Continue Lasix 40 mg daily  - Continue carvedilol 6.25 mg BID, he has not tolerated increase in the  past. - Continue Entresto 97/103 mg BID.  - Continue spironolactone 50 mg daily.  - Recently changed from [place] to [place] due to insurance preference, but developed rash so Jardiance was discontinued. Restart Farxiga 10 mg daily since has tolerated in the past.  - Digoxin discontinued 09/14/21 due to EF improvement and minimal symptoms.  - He appears to be out of ICD range.    3. OSA: Waiting for CPAP.  4. Foot ulcers: Thought to be related to diabetes.   ABIs in 08/2021 were normal.  5. Obesity: Continue to work on weight loss, BMI 31.  - Per Dr. Shirlee Latch, will refer to Pharmacy Clinic for semaglutide.  6. Diabetes -Recent A1C 7.1 10/31/21. Restarting [name] today as above and referred for semaglutide. Would consider stopping glipizide once semaglutide initiated.   Follow up 4 months with Dr. Jonn Shingles, PharmD, BCPS, [facility], CPP Heart Failure Clinic Pharmacist [phone]

## 2021-10-30 NOTE — Telephone Encounter (Signed)
Pt has upcoming appointment with Dr. Delford Field.

## 2021-10-31 ENCOUNTER — Other Ambulatory Visit: Payer: Self-pay

## 2021-10-31 ENCOUNTER — Encounter: Payer: Self-pay | Admitting: Critical Care Medicine

## 2021-10-31 ENCOUNTER — Ambulatory Visit
Payer: No Typology Code available for payment source | Attending: Critical Care Medicine | Admitting: Critical Care Medicine

## 2021-10-31 ENCOUNTER — Other Ambulatory Visit (HOSPITAL_COMMUNITY): Payer: Self-pay

## 2021-10-31 VITALS — BP 117/84 | HR 83 | Resp 16 | Ht 78.0 in | Wt 272.2 lb

## 2021-10-31 DIAGNOSIS — E1122 Type 2 diabetes mellitus with diabetic chronic kidney disease: Secondary | ICD-10-CM

## 2021-10-31 DIAGNOSIS — E11630 Type 2 diabetes mellitus with periodontal disease: Secondary | ICD-10-CM | POA: Diagnosis not present

## 2021-10-31 DIAGNOSIS — Z6831 Body mass index (BMI) 31.0-31.9, adult: Secondary | ICD-10-CM

## 2021-10-31 DIAGNOSIS — E1169 Type 2 diabetes mellitus with other specified complication: Secondary | ICD-10-CM | POA: Diagnosis not present

## 2021-10-31 DIAGNOSIS — N183 Chronic kidney disease, stage 3 unspecified: Secondary | ICD-10-CM

## 2021-10-31 DIAGNOSIS — I255 Ischemic cardiomyopathy: Secondary | ICD-10-CM

## 2021-10-31 DIAGNOSIS — I10 Essential (primary) hypertension: Secondary | ICD-10-CM

## 2021-10-31 DIAGNOSIS — I5042 Chronic combined systolic (congestive) and diastolic (congestive) heart failure: Secondary | ICD-10-CM

## 2021-10-31 DIAGNOSIS — E782 Mixed hyperlipidemia: Secondary | ICD-10-CM

## 2021-10-31 DIAGNOSIS — I509 Heart failure, unspecified: Secondary | ICD-10-CM | POA: Diagnosis not present

## 2021-10-31 DIAGNOSIS — Z794 Long term (current) use of insulin: Secondary | ICD-10-CM

## 2021-10-31 DIAGNOSIS — I2510 Atherosclerotic heart disease of native coronary artery without angina pectoris: Secondary | ICD-10-CM

## 2021-10-31 DIAGNOSIS — E669 Obesity, unspecified: Secondary | ICD-10-CM

## 2021-10-31 LAB — POCT GLYCOSYLATED HEMOGLOBIN (HGB A1C): HbA1c, POC (controlled diabetic range): 7.2 % — AB (ref 0.0–7.0)

## 2021-10-31 LAB — GLUCOSE, POCT (MANUAL RESULT ENTRY): POC Glucose: 139 mg/dl — AB (ref 70–99)

## 2021-10-31 MED ORDER — GLIPIZIDE 10 MG PO TABS
ORAL_TABLET | Freq: Two times a day (BID) | ORAL | 6 refills | Status: DC
  Filled 2021-10-31: qty 60, fill #0

## 2021-10-31 MED ORDER — CARVEDILOL 6.25 MG PO TABS
ORAL_TABLET | Freq: Two times a day (BID) | ORAL | 6 refills | Status: DC
  Filled 2021-10-31 – 2021-11-12 (×2): qty 60, 30d supply, fill #0
  Filled 2021-12-10: qty 60, 30d supply, fill #1
  Filled 2022-01-14: qty 60, 30d supply, fill #0
  Filled 2022-02-05 – 2022-02-14 (×3): qty 60, 30d supply, fill #1
  Filled 2022-03-31: qty 120, 60d supply, fill #2
  Filled 2022-06-05: qty 120, 60d supply, fill #3
  Filled 2022-07-04: qty 120, 60d supply, fill #4
  Filled 2022-08-06: qty 60, 30d supply, fill #4

## 2021-10-31 MED ORDER — SPIRONOLACTONE 50 MG PO TABS
50.0000 mg | ORAL_TABLET | Freq: Every day | ORAL | 3 refills | Status: DC
  Filled 2021-10-31 – 2021-11-12 (×2): qty 30, 30d supply, fill #0
  Filled 2021-12-10: qty 30, 30d supply, fill #1
  Filled 2022-01-10: qty 30, 30d supply, fill #2
  Filled 2022-01-11: qty 30, 30d supply, fill #0
  Filled 2022-02-05 – 2022-02-14 (×2): qty 30, 30d supply, fill #1
  Filled 2022-03-11: qty 30, 30d supply, fill #2
  Filled 2022-04-10: qty 30, 30d supply, fill #3
  Filled 2022-05-08: qty 30, 30d supply, fill #4
  Filled 2022-06-05: qty 30, 30d supply, fill #5

## 2021-10-31 MED ORDER — SACUBITRIL-VALSARTAN 97-103 MG PO TABS
1.0000 | ORAL_TABLET | Freq: Two times a day (BID) | ORAL | 3 refills | Status: DC
  Filled 2021-10-31 – 2021-11-20 (×2): qty 180, 90d supply, fill #0
  Filled 2021-11-20: qty 60, 30d supply, fill #0
  Filled 2022-02-27: qty 180, 90d supply, fill #1
  Filled 2022-06-05: qty 180, 90d supply, fill #2
  Filled 2022-07-04: qty 180, 90d supply, fill #3
  Filled 2022-08-06: qty 60, 30d supply, fill #3

## 2021-10-31 MED ORDER — FUROSEMIDE 40 MG PO TABS
ORAL_TABLET | Freq: Every day | ORAL | 3 refills | Status: DC
  Filled 2021-10-31 – 2021-11-12 (×2): qty 30, 30d supply, fill #0
  Filled 2021-12-10: qty 30, 30d supply, fill #1
  Filled 2022-01-10: qty 30, 30d supply, fill #2
  Filled 2022-01-11 (×3): qty 30, 30d supply, fill #0
  Filled 2022-02-05 – 2022-02-14 (×2): qty 30, 30d supply, fill #1
  Filled 2022-03-11: qty 30, 30d supply, fill #2
  Filled 2022-04-10: qty 30, 30d supply, fill #3
  Filled 2022-05-08: qty 30, 30d supply, fill #4
  Filled 2022-06-05: qty 30, 30d supply, fill #5

## 2021-10-31 MED ORDER — LANTUS SOLOSTAR 100 UNIT/ML ~~LOC~~ SOPN
20.0000 [IU] | PEN_INJECTOR | Freq: Every day | SUBCUTANEOUS | 3 refills | Status: DC
Start: 2021-10-31 — End: 2022-05-09
  Filled 2021-10-31 – 2022-02-14 (×3): qty 6, 30d supply, fill #0
  Filled 2022-03-14: qty 6, 30d supply, fill #1
  Filled 2022-04-10: qty 6, 30d supply, fill #2

## 2021-10-31 MED ORDER — ATORVASTATIN CALCIUM 80 MG PO TABS
ORAL_TABLET | ORAL | 6 refills | Status: DC
  Filled 2021-10-31: qty 90, fill #0
  Filled 2021-11-19: qty 30, 30d supply, fill #0
  Filled 2021-12-31: qty 30, 30d supply, fill #1
  Filled 2022-02-05: qty 90, 90d supply, fill #0
  Filled 2022-02-05: qty 30, 30d supply, fill #2
  Filled 2022-02-14: qty 30, 30d supply, fill #0
  Filled 2022-03-31: qty 90, 90d supply, fill #1
  Filled 2022-07-04: qty 90, 90d supply, fill #2
  Filled 2022-08-06: qty 30, 30d supply, fill #3

## 2021-10-31 MED ORDER — ONETOUCH DELICA PLUS LANCET33G MISC
2 refills | Status: AC
  Filled 2021-10-31: qty 100, 33d supply, fill #0
  Filled 2022-07-04: qty 100, 30d supply, fill #0
  Filled 2022-09-09: qty 100, 30d supply, fill #1

## 2021-10-31 MED ORDER — OMEPRAZOLE 20 MG PO CPDR
20.0000 mg | DELAYED_RELEASE_CAPSULE | Freq: Every day | ORAL | 4 refills | Status: DC
  Filled 2021-10-31: qty 30, 30d supply, fill #0
  Filled 2021-11-12: qty 60, 60d supply, fill #0
  Filled 2022-01-10: qty 60, 60d supply, fill #1
  Filled 2022-01-11: qty 60, 60d supply, fill #0
  Filled 2022-03-11: qty 30, 30d supply, fill #1
  Filled 2022-04-10: qty 30, 30d supply, fill #2
  Filled 2022-05-14: qty 30, 30d supply, fill #3
  Filled 2022-06-11: qty 60, 60d supply, fill #4
  Filled 2022-07-04 – 2022-08-06 (×2): qty 30, 30d supply, fill #5

## 2021-10-31 MED ORDER — INSULIN PEN NEEDLE 31G X 5 MM MISC
6 refills | Status: DC
  Filled 2021-10-31: qty 100, fill #0
  Filled 2022-04-01: qty 100, 30d supply, fill #0
  Filled 2022-06-05: qty 100, 30d supply, fill #1
  Filled 2022-07-04: qty 100, 30d supply, fill #2

## 2021-10-31 NOTE — Assessment & Plan Note (Signed)
Compensated chronic heart failure continue current cardiac medications without change

## 2021-10-31 NOTE — Assessment & Plan Note (Signed)
Continue high-dose lipid therapy 

## 2021-10-31 NOTE — Assessment & Plan Note (Signed)
Ischemic cardiomyopathy stable at this time

## 2021-10-31 NOTE — Progress Notes (Signed)
Established Patient Office Visit  Subjective:  Patient ID: [name], male    DOB: 1971-03-09  Age: 50 y.o. MRN: [id number]  CC:  Chief Complaint  Patient presents with   Diabetes   Medication Refill    HPI [name] presents for primary care follow-up visit for diabetes hypertension coronary disease and chronic systolic heart failure.  Since the last visit the patient had to stop Jardiance because of a fungal rash in the groin.  He is maintaining glipizide Lantus at this time.  He is being considered for potential Trulicity.  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  He developed cellulitis over the dorsal aspect of that hand and we did a telephone visit last week and prescribed Bactrim for this and has resolved the cellulitis  Patient has an upcoming appointment with the pharmacist at the heart vascular clinic to discuss further changes in his diabetic medications.  He is stable on all of his heart medicines on arrival blood pressure is 117/84 .  His hemoglobin A1c on arrival is 7.1 this is an improvement from prior values.  He has been having follow-up with podiatry and he has healed his left foot ulcer he does have a Charcot foot on the right and has some internal fractures which are being watched he does not have any new ulcerations.  Past Medical History:  Diagnosis Date   Acute systolic CHF (congestive heart failure) (HCC) 10/28/2016   CAD (coronary artery disease)    NSTEMI 10/2011: LHC 11/04/11: pLAD 90%, mLAD 60-70%, small D2 sub totally occluded at ostium, small OM1 90% ostial, 90% mid, mOM2 30%, oPL 80%, RCA 30%, dPDA 80%, EF 20% with ant AK.  PCI:  Promus DES to pLAD.   Cellulitis of left foot 06/22/2020   Chronic kidney disease    CKD   Chronic systolic heart failure (HCC)    Diabetic ulcer of left midfoot associated with type 2 diabetes mellitus, with muscle involvement without evidence of necrosis (HCC)    DM2 (diabetes  mellitus, type 2) (HCC)    type 2   GERD (gastroesophageal reflux disease)    HTN (hypertension)    Hyperlipidemia    Ischemic cardiomyopathy    Echo 11/03/11: mod LVH, mild focal basal septal hypertrophy, EF 15%, grade 2 diast dysfxn, mild MR, mild to mod LAE, mild RVE, mild to mod reduced RVSF.  EF 3/5 50% by echo   Myocardial infarction [facility])    Neuromuscular disorder (HCC)    neuropathy feet   Obesity    Ulcer of right foot (HCC) 06/22/2020    Past Surgical History:  Procedure Laterality Date   AMPUTATION Left 06/23/2020   Procedure: AMPUTATION RAY 5th;  Surgeon: Vivi Barrack, DPM;  Location: [facility] OR;  Service: Podiatry;  Laterality: Left;   BONE BIOPSY Left 12/20/2020   Procedure: BONE BIOPSY X 3;  Surgeon: Park Liter, DPM;  Location: [facility];  Service: Podiatry;  Laterality: Left;   CARDIAC CATHETERIZATION N/A 10/25/2016   Procedure: Right/Left Heart Cath and Coronary Angiography;  Surgeon: Marykay Lex, MD;  Location: [facility] INVASIVE CV LAB;  Service: Cardiovascular;  Laterality: N/A;   CARDIAC CATHETERIZATION N/A 10/25/2016   Procedure: Coronary Stent Intervention;  Surgeon: Marykay Lex, MD;  Location: [facility] INVASIVE CV LAB;  Service: Cardiovascular;  Laterality: N/A;   CARDIAC CATHETERIZATION N/A 10/28/2016   Procedure: Coronary Stent Intervention;  Surgeon: Kathleene Hazel, MD;  Location: [facility] INVASIVE CV  LAB;  Service: Cardiovascular;  Laterality: N/A;   GRAFT APPLICATION Left 08/16/5630   Procedure: APPLICATION OF SKIN GRAFT USING [employer] BILAYER MATRIX WOUND DRESSING;  Surgeon: Evelina Bucy, DPM;  Location: [place];  Service: Podiatry;  Laterality: Left;   I & D EXTREMITY Left 07/14/2020   Procedure: IRRIGATION AND DEBRIDEMENT LEFT FOOT;  Surgeon: Evelina Bucy, DPM;  Location: [place];  Service: Podiatry;  Laterality: Left;   LEFT AND RIGHT HEART CATHETERIZATION WITH CORONARY ANGIOGRAM N/A 11/04/2011   Procedure: LEFT AND RIGHT HEART CATHETERIZATION WITH  CORONARY ANGIOGRAM;  Surgeon: Minus Breeding, MD;  Location: [facility] CATH LAB;  Service: Cardiovascular;  Laterality: N/A;   METATARSAL HEAD EXCISION Left 01/24/2021   Procedure: 4th METATARSAL HEAD EXCISION, Proximal phalanx resection;  Surgeon: Evelina Bucy, DPM;  Location: [facility];  Service: Podiatry;  Laterality: Left;   METATARSAL HEAD EXCISION Right 03/30/2021   Procedure: PROXIMAL 5TH TOE RESECTION ON RIGHT;  Surgeon: Evelina Bucy, DPM;  Location: [facility];  Service: Podiatry;  Laterality: Right;   PERCUTANEOUS CORONARY STENT INTERVENTION (PCI-S)  11/04/2011   Procedure: PERCUTANEOUS CORONARY STENT INTERVENTION (PCI-S);  Surgeon: Sherren Mocha, MD;  Location: [facility] CATH LAB;  Service: Cardiovascular;;   WOUND DEBRIDEMENT Left 06/26/2020   Procedure: LEFT FOOT WOUND DEBRIDEMENT AND GRAFT APPLICATION;  Surgeon: Trula Slade, DPM;  Location: [place];  Service: Podiatry;  Laterality: Left;   WOUND DEBRIDEMENT Left 12/20/2020   Procedure: DEBRIDEMENT WOUND AND POSSIBLE CLOSURE;  Surgeon: Evelina Bucy, DPM;  [facility];  Service: Podiatry;  Laterality: Left;   WOUND DEBRIDEMENT Left 01/24/2021   Procedure: DEBRIDEMENT WOUND;  Surgeon: Evelina Bucy, DPM;  [facility];  Service: Podiatry;  Laterality: Left;   WOUND DEBRIDEMENT Bilateral 03/30/2021   Procedure: DEBRIDEMENT WOUND WITH SKIN GRAFT SUBSTITUTE APPLICATION;  Surgeon: Evelina Bucy, DPM;  [facility];  Service: Podiatry;  Laterality: Bilateral;   WOUND DEBRIDEMENT Right 04/20/2021   Procedure: DEBRIDEMENT Gaylan Gerold FOOT;  Surgeon: Evelina Bucy, DPM;  [facility];  Service: Podiatry;  Laterality: Right;    Family History  Problem Relation Age of Onset   Heart disease Father    Heart disease Mother        MOTHER HAD CABG    Social History   Socioeconomic History   Marital status: Single    Spouse name: Not on file   Number of children: Not on file   Years of education: Not on file   Highest  education level: Not on file  Occupational History   Not on file  Tobacco Use   Smoking status: Never   Smokeless tobacco: Never  Vaping Use   Vaping Use: Never used  Substance and Sexual Activity   Alcohol use: Not Currently    Alcohol/week: 1.0 standard drink    Types: 1 Shots of liquor per week   Drug use: No   Sexual activity: Yes    Birth control/protection: None  Other Topics Concern   Not on file  Social History Narrative   Not on file   Social Determinants of Health   Financial Resource Strain: Not on file  Food Insecurity: Not on file  Transportation Needs: Not on file  Physical Activity: Not on file  Stress: Not on file  Social Connections: Not on file  Intimate Partner Violence: Not on file    Outpatient Medications Prior to Visit  Medication Sig Dispense Refill   acetaminophen (TYLENOL) 500 MG tablet Take  500 mg by mouth every 6 (six) hours as needed.     aspirin 81 MG chewable tablet Chew 1 tablet (81 mg total) by mouth daily. 30 tablet 6   Blood Glucose Monitoring Suppl (ONETOUCH VERIO) w/Device KIT Use to check blood sugar 3 times daily. 1 kit 0   Evolocumab 140 MG/ML SOAJ INJECT 1 PEN INTO THE SKIN EVERY 14 (FOURTEEN) DAYS. 2 mL 11   glucose blood test strip USE AS DIRECTED 3 TIMES DAILY (Patient taking differently: USE AS DIRECTED 3 TIMES DAILY) 100 strip 12   atorvastatin (LIPITOR) 80 MG tablet TAKE 1 TABLET (80 MG TOTAL) BY MOUTH EVERY EVENING. 30 tablet 6   Blood Glucose Monitoring Suppl (ONETOUCH VERIO REFLECT) w/Device KIT USE TO CHECK BLOOD SUGAR 3 TIMES DAILY. 1 kit 0   carvedilol (COREG) 6.25 MG tablet TAKE 1 TABLET (6.25 MG TOTAL) BY MOUTH 2 (TWO) TIMES DAILY WITH A MEAL. 60 tablet 6   furosemide (LASIX) 40 MG tablet TAKE 1 TABLET (40 MG TOTAL) BY MOUTH DAILY. 30 tablet 3   glipiZIDE (GLUCOTROL) 10 MG tablet TAKE 1 TABLET (10 MG TOTAL) BY MOUTH 2 (TWO) TIMES DAILY BEFORE A MEAL. 60 tablet 6   insulin glargine (LANTUS SOLOSTAR) 100 UNIT/ML Solostar  Pen INJECT 20 UNITS INTO THE SKIN DAILY. 6 mL 3   Insulin Pen Needle 31G X 5 MM MISC USE TO INJECT INSULIN AT BEDTIME. 100 each 6   Lancets (ONETOUCH DELICA PLUS [id number]) MISC USE TO CHECK BLOOD SUGAR 3 TIMES DAILY. 100 each 2   omeprazole (PRILOSEC) 20 MG capsule Take 1 capsule (20 mg total) by mouth daily. 30 capsule 0   OneTouch Delica Lancets 42H MISC Use to check blood sugar 3 times daily. 100 each 2   sacubitril-valsartan (ENTRESTO) 97-103 MG TAKE 1 TABLET BY MOUTH 2 (TWO) TIMES DAILY. 180 tablet 3   spironolactone (ALDACTONE) 50 MG tablet Take 1 tablet (50 mg total) by mouth daily. 30 tablet 2   sulfamethoxazole-trimethoprim (BACTRIM DS) 800-160 MG tablet Take 1 tablet by mouth 2 (two) times daily. (Patient not taking: Reported on 10/31/2021) 14 tablet 0   Facility-Administered Medications Prior to Visit  Medication Dose Route Frequency Provider Last Rate Last Admin   vancomycin (VANCOCIN) powder    PRN Evelina Bucy, DPM   1,000 mg at 01/24/21 1411    Allergies  Allergen Reactions   Jardiance [Empagliflozin] Rash    ROS Review of Systems  Constitutional: Negative.   HENT: Negative.  Negative for ear pain, postnasal drip, rhinorrhea, sinus pressure, sore throat, trouble swallowing and voice change.   Eyes: Negative.   Respiratory: Negative.  Negative for apnea, cough, choking, chest tightness, shortness of breath, wheezing and stridor.   Cardiovascular: Negative.  Negative for chest pain, palpitations and leg swelling.  Gastrointestinal: Negative.  Negative for abdominal distention, abdominal pain, nausea and vomiting.  Genitourinary: Negative.   Musculoskeletal: Negative.  Negative for arthralgias and myalgias.  Skin: Negative.  Negative for rash and wound.  Allergic/Immunologic: Negative.  Negative for environmental allergies and food allergies.  Neurological: Negative.  Negative for dizziness, syncope, weakness and headaches.  Hematological: Negative.  Negative for  adenopathy. Does not bruise/bleed easily.  Psychiatric/Behavioral: Negative.  Negative for agitation and sleep disturbance. The patient is not nervous/anxious.      Objective:    Physical Exam Vitals reviewed.  Constitutional:      Appearance: Normal appearance. He is well-developed. He is obese. He is not diaphoretic.  HENT:  Head: Normocephalic and atraumatic.     Nose: No nasal deformity, septal deviation, mucosal edema or rhinorrhea.     Right Sinus: No maxillary sinus tenderness or frontal sinus tenderness.     Left Sinus: No maxillary sinus tenderness or frontal sinus tenderness.     Mouth/Throat:     Pharynx: No oropharyngeal exudate.     Comments: Significant gingivitis Eyes:     General: No scleral icterus.    Conjunctiva/sclera: Conjunctivae normal.     Pupils: Pupils are equal, round, and reactive to light.  Neck:     Thyroid: No thyromegaly.     Vascular: No carotid bruit or JVD.     Trachea: Trachea normal. No tracheal tenderness or tracheal deviation.  Cardiovascular:     Rate and Rhythm: Normal rate and regular rhythm.     Chest Wall: PMI is not displaced.     Pulses: Normal pulses. No decreased pulses.     Heart sounds: Normal heart sounds, S1 normal and S2 normal. Heart sounds not distant. No murmur heard. No systolic murmur is present.  No diastolic murmur is present.    No friction rub. No gallop. No S3 or S4 sounds.  Pulmonary:     Effort: No tachypnea, accessory muscle usage or respiratory distress.     Breath sounds: No stridor. No decreased breath sounds, wheezing, rhonchi or rales.  Chest:     Chest wall: No tenderness.  Abdominal:     General: Bowel sounds are normal. There is no distension.     Palpations: Abdomen is soft. Abdomen is not rigid.     Tenderness: There is no abdominal tenderness. There is no guarding or rebound.  Musculoskeletal:        General: Normal range of motion.     Cervical back: Normal range of motion and neck supple. No  edema, erythema or rigidity. No muscular tenderness. Normal range of motion.  Lymphadenopathy:     Head:     Right side of head: No submental or submandibular adenopathy.     Left side of head: No submental or submandibular adenopathy.     Cervical: No cervical adenopathy.  Skin:    General: Skin is warm and dry.     Coloration: Skin is not pale.     Findings: No rash.     Nails: There is no clubbing.  Neurological:     Mental Status: He is alert and oriented to person, place, and time.     Sensory: No sensory deficit.  Psychiatric:        Speech: Speech normal.        Behavior: Behavior normal.    BP 117/84   Pulse 83   Resp 16   Ht $R'6\' 6"'PW$  (1.981 m)   Wt 272 lb 3.2 oz (123.5 kg)   SpO2 96%   BMI 31.46 kg/m  Wt Readings from Last 3 Encounters:  10/31/21 272 lb 3.2 oz (123.5 kg)  09/14/21 270 lb (122.5 kg)  06/15/21 263 lb 4 oz (119.4 kg)     Health Maintenance Due  Topic Date Due   OPHTHALMOLOGY EXAM  Never done    There are no preventive care reminders to display for this patient.  Lab Results  Component Value Date   TSH 2.38 06/14/2013   Lab Results  Component Value Date   WBC 11.1 (H) 04/20/2021   HGB 12.1 (L) 04/20/2021   HCT 37.6 (L) 04/20/2021   MCV 80.5 04/20/2021   PLT 293  04/20/2021   Lab Results  Component Value Date   NA 138 09/14/2021   K 4.2 09/14/2021   CO2 25 09/14/2021   GLUCOSE 146 (H) 09/14/2021   BUN 24 (H) 09/14/2021   CREATININE 1.16 09/14/2021   BILITOT 0.4 12/08/2020   ALKPHOS 64 12/08/2020   AST 16 12/08/2020   ALT 20 12/08/2020   PROT 7.0 12/08/2020   ALBUMIN 4.2 12/08/2020   CALCIUM 9.3 09/14/2021   ANIONGAP 8 09/14/2021   GFR 71.35 12/02/2011   Lab Results  Component Value Date   CHOL 102 09/14/2021   Lab Results  Component Value Date   HDL 38 (L) 09/14/2021   Lab Results  Component Value Date   LDLCALC 45 09/14/2021   Lab Results  Component Value Date   TRIG 93 09/14/2021   Lab Results  Component Value  Date   CHOLHDL 2.7 09/14/2021   Lab Results  Component Value Date   HGBA1C 7.2 (A) 10/31/2021      Assessment & Plan:   Problem List Items Addressed This Visit       Cardiovascular and Mediastinum   Ischemic cardiomyopathy (Chronic)    Ischemic cardiomyopathy stable at this time      Relevant Medications   atorvastatin (LIPITOR) 80 MG tablet   carvedilol (COREG) 6.25 MG tablet   furosemide (LASIX) 40 MG tablet   sacubitril-valsartan (ENTRESTO) 97-103 MG   spironolactone (ALDACTONE) 50 MG tablet   Essential hypertension    Stable blood pressure continue current blood pressure medications without change      Relevant Medications   atorvastatin (LIPITOR) 80 MG tablet   carvedilol (COREG) 6.25 MG tablet   furosemide (LASIX) 40 MG tablet   sacubitril-valsartan (ENTRESTO) 97-103 MG   spironolactone (ALDACTONE) 50 MG tablet   Chronic combined systolic and diastolic congestive heart failure (HCC)    Compensated chronic heart failure continue current cardiac medications without change      Relevant Medications   atorvastatin (LIPITOR) 80 MG tablet   carvedilol (COREG) 6.25 MG tablet   furosemide (LASIX) 40 MG tablet   sacubitril-valsartan (ENTRESTO) 97-103 MG   spironolactone (ALDACTONE) 50 MG tablet     Digestive   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 ([place])    Active periodontal disease I gave patient diet dental resources asked him to get a dental hygienist exam and cleaning      Relevant Medications   atorvastatin (LIPITOR) 80 MG tablet   glipiZIDE (GLUCOTROL) 10 MG tablet   insulin glargine (LANTUS SOLOSTAR) 100 UNIT/ML Solostar Pen     Endocrine   Controlled type 2 diabetes mellitus (HCC)    Type 2 diabetes now very close to goal at 7.1  I think Trulicity is in addition will be an excellent choice since he failed Jardiance  Continue Lantus and glipizide for now  Check urine for microalbumin this visit  Advised patient to get a diabetic eye  exam given resources      Relevant Medications   atorvastatin (LIPITOR) 80 MG tablet   glipiZIDE (GLUCOTROL) 10 MG tablet   insulin glargine (LANTUS SOLOSTAR) 100 UNIT/ML Solostar Pen   Insulin Pen Needle 31G X 5 MM MISC   Mixed diabetic hyperlipidemia associated with type 2 diabetes mellitus (HCC)    Continue high-dose lipid therapy      Relevant Medications   atorvastatin (LIPITOR) 80 MG tablet   carvedilol (COREG) 6.25 MG tablet   furosemide (LASIX) 40 MG tablet   glipiZIDE (GLUCOTROL) 10  MG tablet   insulin glargine (LANTUS SOLOSTAR) 100 UNIT/ML Solostar Pen   sacubitril-valsartan (ENTRESTO) 97-103 MG   spironolactone (ALDACTONE) 50 MG tablet     Genitourinary   CKD (chronic kidney disease), stage III (HCC)    Monitor renal function      Other Visit Diagnoses     Diabetes mellitus type 2 in obese (HCC)    -  Primary   Relevant Medications   atorvastatin (LIPITOR) 80 MG tablet   glipiZIDE (GLUCOTROL) 10 MG tablet   insulin glargine (LANTUS SOLOSTAR) 100 UNIT/ML Solostar Pen   Insulin Pen Needle 31G X 5 MM MISC   Other Relevant Orders   POCT glucose (manual entry) (Completed)   POCT glycosylated hemoglobin (Hb A1C) (Completed)   Microalbumin / creatinine urine ratio   Chronic heart failure, unspecified heart failure type (HCC)       Relevant Medications   atorvastatin (LIPITOR) 80 MG tablet   carvedilol (COREG) 6.25 MG tablet   furosemide (LASIX) 40 MG tablet   sacubitril-valsartan (ENTRESTO) 97-103 MG   spironolactone (ALDACTONE) 50 MG tablet   Coronary artery disease involving native coronary artery of native heart without angina pectoris       Relevant Medications   atorvastatin (LIPITOR) 80 MG tablet   carvedilol (COREG) 6.25 MG tablet   furosemide (LASIX) 40 MG tablet   glipiZIDE (GLUCOTROL) 10 MG tablet   sacubitril-valsartan (ENTRESTO) 97-103 MG   spironolactone (ALDACTONE) 50 MG tablet       Meds ordered this encounter  Medications   atorvastatin  (LIPITOR) 80 MG tablet    Sig: TAKE 1 TABLET (80 MG TOTAL) BY MOUTH EVERY EVENING.    Dispense:  90 tablet    Refill:  6   carvedilol (COREG) 6.25 MG tablet    Sig: TAKE 1 TABLET (6.25 MG TOTAL) BY MOUTH 2 (TWO) TIMES DAILY WITH A MEAL.    Dispense:  120 tablet    Refill:  6   furosemide (LASIX) 40 MG tablet    Sig: TAKE 1 TABLET (40 MG TOTAL) BY MOUTH DAILY.    Dispense:  60 tablet    Refill:  3   glipiZIDE (GLUCOTROL) 10 MG tablet    Sig: TAKE 1 TABLET (10 MG TOTAL) BY MOUTH 2 (TWO) TIMES DAILY BEFORE A MEAL.    Dispense:  60 tablet    Refill:  6   insulin glargine (LANTUS SOLOSTAR) 100 UNIT/ML Solostar Pen    Sig: INJECT 20 UNITS INTO THE SKIN DAILY.    Dispense:  6 mL    Refill:  3   Insulin Pen Needle 31G X 5 MM MISC    Sig: USE TO INJECT INSULIN AT BEDTIME.    Dispense:  100 each    Refill:  6   Lancets (ONETOUCH DELICA PLUS ALPFXT02I) MISC    Sig: USE TO CHECK BLOOD SUGAR 3 TIMES DAILY.    Dispense:  100 each    Refill:  2   omeprazole (PRILOSEC) 20 MG capsule    Sig: Take 1 capsule (20 mg total) by mouth daily.    Dispense:  60 capsule    Refill:  4   sacubitril-valsartan (ENTRESTO) 97-103 MG    Sig: TAKE 1 TABLET BY MOUTH 2 (TWO) TIMES DAILY.    Dispense:  180 tablet    Refill:  3   spironolactone (ALDACTONE) 50 MG tablet    Sig: Take 1 tablet (50 mg total) by mouth daily.    Dispense:  60  tablet    Refill:  3    Follow-up: Return in about 4 months (around 02/28/2022).    Asencion Noble, MD

## 2021-10-31 NOTE — Assessment & Plan Note (Signed)
Monitor renal function 

## 2021-10-31 NOTE — Assessment & Plan Note (Signed)
Stable blood pressure continue current blood pressure medications without change

## 2021-10-31 NOTE — Assessment & Plan Note (Addendum)
Type 2 diabetes now very close to goal at 7.1  I think Trulicity is in addition will be an excellent choice since he failed Jardiance  Continue Lantus and glipizide for now  Check urine for microalbumin this visit  Advised patient to get a diabetic eye exam given resources

## 2021-10-31 NOTE — Assessment & Plan Note (Signed)
Active periodontal disease I gave patient diet dental resources asked him to get a dental hygienist exam and cleaning

## 2021-10-31 NOTE — Patient Instructions (Signed)
Please deliver a urine sample on your way out today to check for protein in the urine  Refills on your medication sent to our pharmacy note the Sherryll Burger was sent to [facility] outpatient pharmacy  Please obtain an eye exam resources given  Please obtain a dental exam resources given  Return to see Dr. Delford Field 4 months  I sent a message to Dr. Shelda Jakes to give consideration to adding Trulicity to your regimen see attachment

## 2021-11-01 LAB — MICROALBUMIN / CREATININE URINE RATIO
Creatinine, Urine: 33.9 mg/dL
Microalb/Creat Ratio: <9 mg/g creat (ref 0–29)
Microalbumin, Urine: <3 ug/mL

## 2021-11-07 ENCOUNTER — Other Ambulatory Visit: Payer: Self-pay

## 2021-11-08 ENCOUNTER — Ambulatory Visit (INDEPENDENT_AMBULATORY_CARE_PROVIDER_SITE_OTHER): Payer: No Typology Code available for payment source | Admitting: Podiatry

## 2021-11-08 ENCOUNTER — Encounter: Payer: Self-pay | Admitting: Podiatry

## 2021-11-08 DIAGNOSIS — E11621 Type 2 diabetes mellitus with foot ulcer: Secondary | ICD-10-CM | POA: Diagnosis not present

## 2021-11-08 DIAGNOSIS — L97425 Non-pressure chronic ulcer of left heel and midfoot with muscle involvement without evidence of necrosis: Secondary | ICD-10-CM

## 2021-11-08 DIAGNOSIS — M14671 Charcot's joint, right ankle and foot: Secondary | ICD-10-CM | POA: Diagnosis not present

## 2021-11-08 NOTE — Progress Notes (Signed)
  Subjective:  Patient ID: [name], male    DOB: 1971/10/07,  MRN: [id number]  Chief Complaint  Patient presents with   Foot Pain    I am doing ok on the left foot and the boot hurts some on the right foot    Nail Problem    The right big toenail on the lateral side hurts some and has some draining   DOS: 04/20/21 Procedure: Wound debridement right with closure and metatarsal resection  50 y.o. male presents for follow-up. Hx confirmed as above. Objective:  Physical Exam: tenderness at the surgical site, local edema noted and calf supple, nontender. Incision: Right lateral foot wound well healed, but with local edema without excessive warmth, redness, drainage. Left 5th metatarsal ulceration remains healed. Assessment:   1. Charcot's joint of foot, right   2. Diabetic ulcer of left midfoot associated with type 2 diabetes mellitus, with muscle involvement without evidence of necrosis [facility])    Plan:  Patient was evaluated and treated and all questions answered.  Right foot edema; Fracture R 4th/5th Metatarsals; Charcot -Foot still warm and edematous but foot structure appears stable. He does have some arch collapse but no significant rocker bottom deformity. No pending areas of high pressure concerning for ulceration.  Left foot wound -Remains healed.  No follow-ups on file.

## 2021-11-12 ENCOUNTER — Other Ambulatory Visit: Payer: Self-pay

## 2021-11-12 ENCOUNTER — Other Ambulatory Visit (HOSPITAL_COMMUNITY): Payer: Self-pay

## 2021-11-12 ENCOUNTER — Ambulatory Visit (HOSPITAL_COMMUNITY)
Admission: RE | Admit: 2021-11-12 | Discharge: 2021-11-12 | Disposition: A | Discharge status: Home / Self Care | Payer: No Typology Code available for payment source | Source: Ambulatory Visit | Attending: Cardiology | Admitting: Cardiology

## 2021-11-12 ENCOUNTER — Telehealth (HOSPITAL_COMMUNITY): Payer: Self-pay | Admitting: Pharmacist

## 2021-11-12 VITALS — BP 116/72 | HR 81 | Wt 268.8 lb

## 2021-11-12 DIAGNOSIS — I252 Old myocardial infarction: Secondary | ICD-10-CM | POA: Diagnosis not present

## 2021-11-12 DIAGNOSIS — I255 Ischemic cardiomyopathy: Secondary | ICD-10-CM | POA: Insufficient documentation

## 2021-11-12 DIAGNOSIS — Z6831 Body mass index (BMI) 31.0-31.9, adult: Secondary | ICD-10-CM | POA: Insufficient documentation

## 2021-11-12 DIAGNOSIS — Z7984 Long term (current) use of oral hypoglycemic drugs: Secondary | ICD-10-CM | POA: Diagnosis not present

## 2021-11-12 DIAGNOSIS — I5022 Chronic systolic (congestive) heart failure: Secondary | ICD-10-CM | POA: Insufficient documentation

## 2021-11-12 DIAGNOSIS — Z79899 Other long term (current) drug therapy: Secondary | ICD-10-CM | POA: Diagnosis not present

## 2021-11-12 DIAGNOSIS — L97509 Non-pressure chronic ulcer of other part of unspecified foot with unspecified severity: Secondary | ICD-10-CM | POA: Insufficient documentation

## 2021-11-12 DIAGNOSIS — E669 Obesity, unspecified: Secondary | ICD-10-CM | POA: Insufficient documentation

## 2021-11-12 DIAGNOSIS — G4733 Obstructive sleep apnea (adult) (pediatric): Secondary | ICD-10-CM | POA: Insufficient documentation

## 2021-11-12 DIAGNOSIS — E119 Type 2 diabetes mellitus without complications: Secondary | ICD-10-CM | POA: Insufficient documentation

## 2021-11-12 MED ORDER — DAPAGLIFLOZIN PROPANEDIOL 10 MG PO TABS
10.0000 mg | ORAL_TABLET | Freq: Every day | ORAL | 11 refills | Status: DC
  Filled 2021-11-12 – 2022-08-06 (×5): qty 30, 30d supply, fill #0

## 2021-11-12 MED FILL — Evolocumab Subcutaneous Soln Auto-Injector 140 MG/ML: SUBCUTANEOUS | 28 days supply | Qty: 2 | Fill #5 | Status: CN

## 2021-11-12 MED FILL — Evolocumab Subcutaneous Soln Auto-Injector 140 MG/ML: SUBCUTANEOUS | 28 days supply | Qty: 2 | Fill #5 | Status: AC

## 2021-11-12 NOTE — Telephone Encounter (Signed)
Patient Advocate Encounter   Received notification from [employer] that prior authorization for [name] is required. Provided information that patient has failed Jardiance (rash).    PA submitted on CoverMyMeds Key [id number] Status is pending   Will continue to follow.   Karle Plumber, PharmD, BCPS, BCCP, CPP Heart Failure Clinic Pharmacist [phone]

## 2021-11-12 NOTE — Patient Instructions (Addendum)
It was a pleasure seeing you today!  MEDICATIONS: -We are changing your medications today -Start Farxiga 10 mg (1 tablet) daily.  -We will refer you to Pharmacy Clinic for Ozempic (semaglutide) -You can try Benadryl and hydrocortisone cream for itching.  -Call if you have questions about your medications.   NEXT APPOINTMENT: Return to clinic in 4 months with Dr. Shirlee Latch. Please call [phone] 2 months in advance to schedule.   In general, to take care of your heart failure: -Limit your fluid intake to 2 Liters (half-gallon) per day.   -Limit your salt intake to ideally 2-3 grams (2000-3000 mg) per day. -Weigh yourself daily and record, and bring that "weight diary" to your next appointment.  (Weight gain of 2-3 pounds in 1 day typically means fluid weight.) -The medications for your heart are to help your heart and help you live longer.   -Please contact us before stopping any of your heart medications.  Call the clinic at [phone] with questions or to reschedule future appointments.

## 2021-11-13 ENCOUNTER — Other Ambulatory Visit: Payer: Self-pay

## 2021-11-14 ENCOUNTER — Telehealth: Payer: Self-pay | Admitting: Student-PharmD

## 2021-11-14 ENCOUNTER — Other Ambulatory Visit: Payer: Self-pay

## 2021-11-14 DIAGNOSIS — E6609 Other obesity due to excess calories: Secondary | ICD-10-CM

## 2021-11-14 DIAGNOSIS — I2510 Atherosclerotic heart disease of native coronary artery without angina pectoris: Secondary | ICD-10-CM

## 2021-11-14 MED ORDER — OZEMPIC (0.25 OR 0.5 MG/DOSE) 2 MG/1.5ML ~~LOC~~ SOPN
PEN_INJECTOR | SUBCUTANEOUS | 1 refills | Status: DC
  Filled 2021-11-14 (×2): qty 1.5, 28d supply, fill #0
  Filled 2021-12-31: qty 1.5, 28d supply, fill #1

## 2021-11-14 MED ORDER — GLIPIZIDE 10 MG PO TABS: 5.0000 mg | ORAL_TABLET | Freq: Two times a day (BID) | ORAL | 3 refills | Status: DC

## 2021-11-14 NOTE — Telephone Encounter (Signed)
[facility] PA was denied because his plan does not cover weight loss medications. Sent in Rx for Ozempic to patent's preferred pharmacy, [facility], and this requires a PA which has now been submitted. Insurance requires he has tried metformin - per chart review he cannot tolerate metformin due to GI upset.

## 2021-11-14 NOTE — Telephone Encounter (Signed)
Received referral from Dr. Shirlee Latch to start semaglutide for this patient for weight loss. He meets FDA approved criteria for semaglutide for use in obesity given BMI 31.06. Obesity is complicated by chronic conditions including HTN, HF, CAD s/p NSTEMI, T2DM, HLD.   No contraindications seen in the chart to [facility] use. Patient confirmed he has no personal or family history of medullary thyroid carcinoma.   He is interested in starting semaglutide for weight loss. PA for Reginal Lutes has been submitted to his insurance. Will follow up on status and cost for [facility] to ensure it is affordable. Given that he has T2DM, should be able to get coverage for Ozempic is Reginal Lutes is not covered. Given HFWYOV shortage at starting doses would need to start with Ozempic anyway.   Once he starts semaglutide, we will need to decrease his glipizide dose to prevent hypoglycemia and as we increase the dose of semaglutide can likely discontinue it altogether. Glipizide causes glucose independent hypoglycemia and as his BG gain better control on semaglutide he will not require as much glipizide. He denies hypoglycemia currently, no BG <70 or symptoms of hypoglycemia, so we can likely plan to cut glipizide dose in half once he starts semaglutide and monitor closely for hypoglycemia to further decrease dose from there.

## 2021-11-14 NOTE — Telephone Encounter (Addendum)
Ozempic PA approved and cost is $24.99. [facility] has it in Risk analyst. Called the patient to let him know of this. He just had recent lab work and his weight checked in the office and he is confident with injection technique due to it being similar to his insulin pen so no need to have him come in to the office for an initial injection visit.   Counseled him to split his glipizide in half to equal 5 mg twice daily before meals to prevent hypoglycemia. He will let me know how much of his 10 mg supply he has at home and if he would like me to send in 5 mg tablets so he doesn't have to split them. This has been updated on his medication list but not yet sent in to his pharmacy.   I will follow up with him over the phone in 4 weeks to see how he is doing on Ozempic and increase the dose at that time if tolerating well with plan to decrease glipizide and ultimately discontinue it as his BG improve on increasing doses of Ozempic.

## 2021-11-15 ENCOUNTER — Other Ambulatory Visit: Payer: Self-pay

## 2021-11-15 NOTE — Telephone Encounter (Signed)
Advanced Heart Failure Patient Advocate Encounter  Prior Authorization for [name] has been denied because [name] is a non-formulary agent. Appeal submitted to [employer].  Karle Plumber, PharmD, BCPS, BCCP, CPP Heart Failure Clinic Pharmacist [phone]

## 2021-11-16 ENCOUNTER — Other Ambulatory Visit: Payer: Self-pay

## 2021-11-19 ENCOUNTER — Other Ambulatory Visit: Payer: Self-pay | Admitting: Critical Care Medicine

## 2021-11-19 ENCOUNTER — Other Ambulatory Visit: Payer: Self-pay

## 2021-11-19 DIAGNOSIS — I2510 Atherosclerotic heart disease of native coronary artery without angina pectoris: Secondary | ICD-10-CM

## 2021-11-19 NOTE — Telephone Encounter (Signed)
   Requested medications are on the active medication list with a different signature by a different provider  Last visit 10/31/21  Future visit scheduled 03/05/21  Notes to clinic current rx written by provider not in this practice, please assess. Requested Prescriptions  Pending Prescriptions Disp Refills   glipiZIDE (GLUCOTROL) 10 MG tablet 60 tablet 6    Sig: TAKE 1 TABLET (10 MG TOTAL) BY MOUTH 2 (TWO) TIMES DAILY BEFORE A MEAL.     Endocrinology:  Diabetes - Sulfonylureas Passed - 11/19/2021  3:57 PM      Passed - HBA1C is between 0 and 7.9 and within 180 days    HbA1c, POC (controlled diabetic range)  Date Value Ref Range Status  10/31/2021 7.2 (A) 0.0 - 7.0 % Final          Passed - Valid encounter within last 6 months    Recent Outpatient Visits           2 weeks ago Controlled type 2 diabetes mellitus with stage 3 chronic kidney disease, with long-term current use of insulin (HCC)   [facility] Storm Frisk, MD   7 months ago Controlled type 2 diabetes mellitus with stage 3 chronic kidney disease, with long-term current use of insulin (HCC)   [facility] Storm Frisk, MD   11 months ago Diabetes mellitus type 2 in obese [facility])   [facility] Storm Frisk, MD   1 year ago Diabetes mellitus type 2 in obese [facility])   [facility] Hoy Register, MD   1 year ago Bilateral impacted cerumen   [facility] Hoy Register, MD       Future Appointments             In 3 months D[place] Field Charlcie Cradle, MD [facility]

## 2021-11-20 ENCOUNTER — Other Ambulatory Visit (HOSPITAL_COMMUNITY): Payer: Self-pay

## 2021-11-21 ENCOUNTER — Other Ambulatory Visit: Payer: Self-pay

## 2021-11-21 MED ORDER — GLIPIZIDE 5 MG PO TABS
5.0000 mg | ORAL_TABLET | Freq: Two times a day (BID) | ORAL | 3 refills | Status: DC
  Filled 2021-11-21: qty 60, 30d supply, fill #0

## 2021-11-28 ENCOUNTER — Other Ambulatory Visit: Payer: Self-pay

## 2021-11-29 ENCOUNTER — Ambulatory Visit (INDEPENDENT_AMBULATORY_CARE_PROVIDER_SITE_OTHER): Payer: No Typology Code available for payment source | Admitting: Podiatry

## 2021-11-29 ENCOUNTER — Encounter: Payer: Self-pay | Admitting: Podiatry

## 2021-11-29 ENCOUNTER — Ambulatory Visit (INDEPENDENT_AMBULATORY_CARE_PROVIDER_SITE_OTHER): Payer: No Typology Code available for payment source

## 2021-11-29 DIAGNOSIS — M14671 Charcot's joint, right ankle and foot: Secondary | ICD-10-CM

## 2021-12-03 NOTE — Progress Notes (Signed)
  Subjective:  Patient ID: [name], male    DOB: 16-Oct-1971,  MRN: [id number]  Chief Complaint  Patient presents with   Foot Pain    The right foot is about the same no pain but does swell every day and the left foot has a hard skin that may need to be trimmed   DOS: 04/20/21 Procedure: Wound debridement right with closure and metatarsal resection  50 y.o. male presents for follow-up. Hx confirmed as above. Objective:  Physical Exam: tenderness at the surgical site, local edema noted and calf supple, nontender. Incision: Right lateral foot wound well healed, no edema or warmth, redness, drainage. Left 5th metatarsal ulceration remains healed. Assessment:   1. Charcot's joint of foot, right    Plan:  Patient was evaluated and treated and all questions answered.  Right foot edema; Fracture R 4th/5th Metatarsals; Charcot -Foot no longer warm nor edematous. Foot structure appears stable. Unchanged rch collapse but no significant rocker bottom deformity. Will monitor arch drop for signs of callus or ulceration. New XR taken today, unchanged from prior.  Left foot wound -Remains healed.  Return in about 3 weeks (around 12/20/2021) for Charcot f/u.

## 2021-12-06 ENCOUNTER — Telehealth: Payer: Self-pay | Admitting: Cardiology

## 2021-12-06 NOTE — Telephone Encounter (Signed)
What problem are you experiencing? Patient states that his machine is blowing out water and he has not been able to use his machine in a couple of weeks  Who is your medical equipment company? [employer]  Scheduled for sleep compliance appt on 03/11/22   Please route to the sleep study assistant.

## 2021-12-10 ENCOUNTER — Other Ambulatory Visit: Payer: Self-pay

## 2021-12-11 ENCOUNTER — Telehealth: Payer: Self-pay | Admitting: Student-PharmD

## 2021-12-11 ENCOUNTER — Other Ambulatory Visit: Payer: Self-pay

## 2021-12-11 MED FILL — Evolocumab Subcutaneous Soln Auto-Injector 140 MG/ML: SUBCUTANEOUS | 28 days supply | Qty: 2 | Fill #6 | Status: AC

## 2021-12-11 NOTE — Telephone Encounter (Signed)
Called patient to see how he is doing after starting Ozempic 4 weeks ago. He reports having no issues at all and he would like to increase to the next dose. He takes it on Mondays with yesterday being the last dose. We will increase Ozempic to 0.5 mg weekly starting on Monday. He should have two weeks remaining in his current pen at this dose and has a refill on his prescription to fill after that which will last for 4 weeks at 0.5 mg weekly.   He does not check his BG at home and hasn't taken glipizide for the past 3 weeks. Given A1c close to goal and he is not checking BG at home/would not know if he was having asymptomatic hypoglycemia, will discontinue glipizide at this time since we are increasing Ozempic now with plan to continue titrating it up. He is agreeable to this plan.   I will follow up with him by phone in about 6 weeks when he will be finishing up his current supply of Ozempic to see if he is tolerating it well and would like to increase again to 1 mg.

## 2021-12-13 ENCOUNTER — Other Ambulatory Visit: Payer: Self-pay

## 2021-12-13 IMAGING — MR MR FOOT*L* WO/W CM
9 series · 40 of 40 positions shown · IV contrast (gadavist)
Comparison: Radiographs dated 06/22/2020

CLINICAL DATA: Cellulitis of the left forefoot involving the dorsal
aspect of the fourth and fifth toes.

EXAM:
MRI OF THE LEFT FOREFOOT WITHOUT AND WITH CONTRAST
TECHNIQUE: Multiplanar, multisequence MR imaging of the left forefoot was
performed both before and after administration of intravenous
contrast.
CONTRAST:  10mL GADAVIST GADOBUTROL 1 MMOL/ML IV SOLN

[Series 5: T2 fat-sat · axial · left · 3.0mm · 0.70mm/px · z∈[-97,+22]mm · 4 of 36 slices shown (1 of 2)]
[im 1/36]
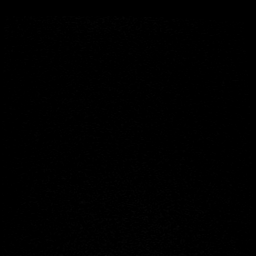
[im 12/36]
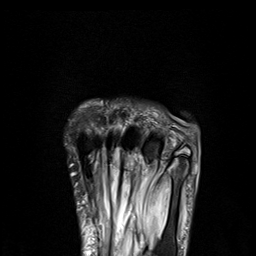
[im 24/36]
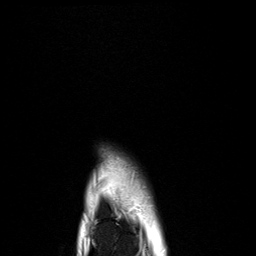
[im 36/36]
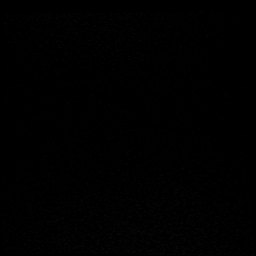

[Series 6: T1 · coronal · left · 3.0mm · 0.52mm/px · 5 of 48 slices shown (1 of 2)]
[im 1/48]
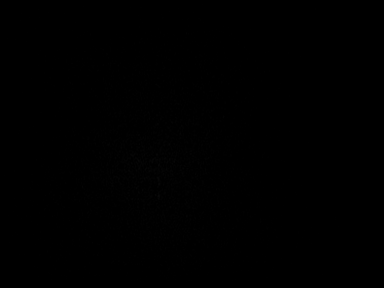
[im 12/48]
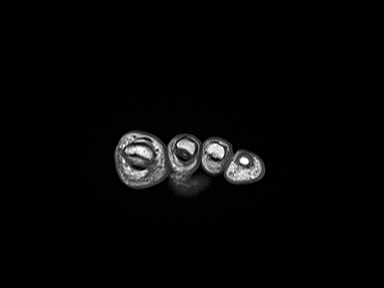
[im 24/48]
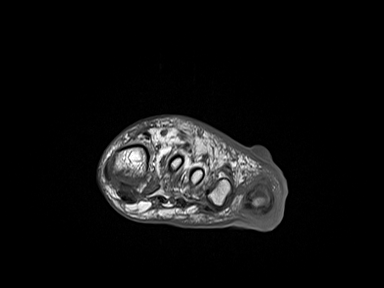
[im 36/48]
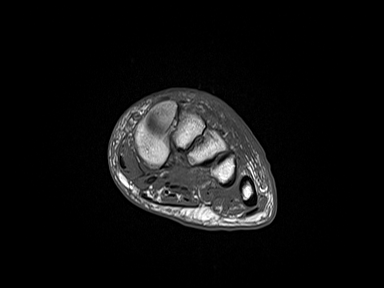
[im 48/48]
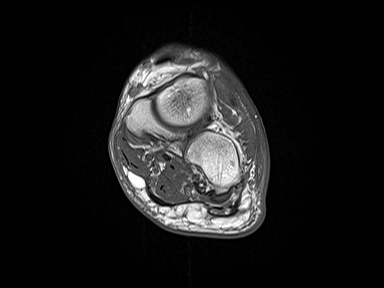

[Series 7: T2 fat-sat · coronal · left · 3.0mm · 0.36mm/px · 5 of 48 slices shown (2 of 2)]
[im 1/48]
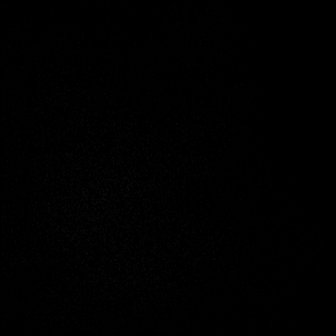
[im 12/48]
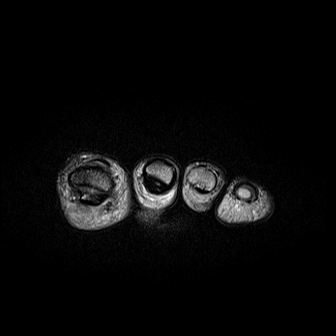
[im 24/48]
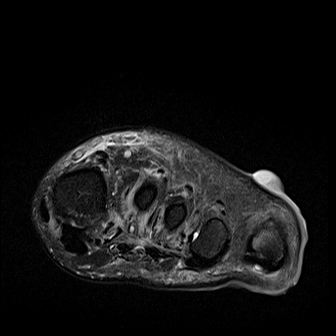
[im 36/48]
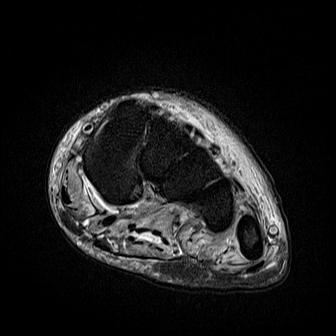
[im 48/48]
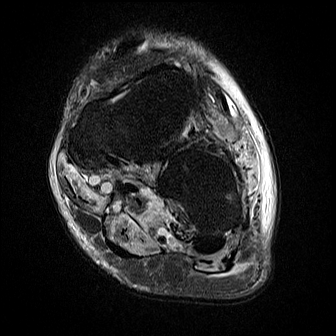

[Series 8: STIR · sagittal · left · 3.0mm · 0.70mm/px · 4 of 37 slices shown]
[im 1/37]
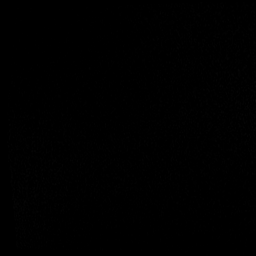
[im 13/37]
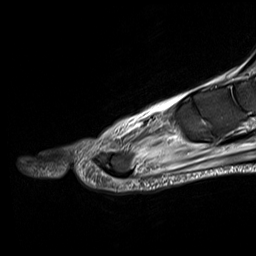
[im 25/37]
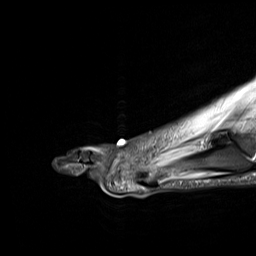
[im 37/37]
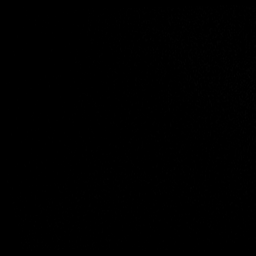

[Series 10: T1 · axial · left · 3.0mm · 0.47mm/px · z∈[-94,+25]mm · 4 of 36 slices shown (2 of 2)]
[im 1/36]
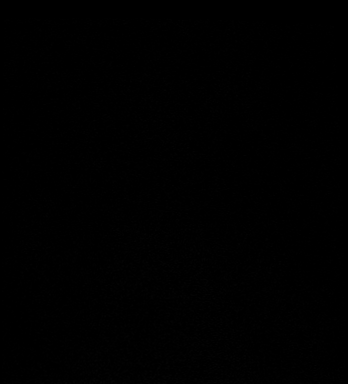
[im 12/36]
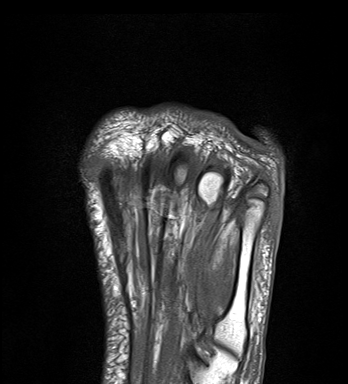
[im 24/36]
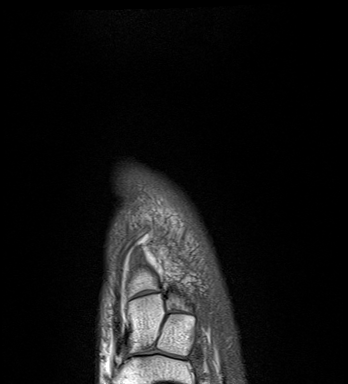
[im 36/36]
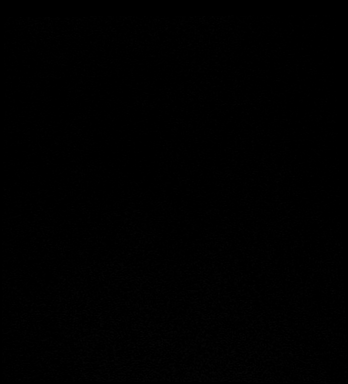

[Series 11: T1 fat-sat · axial · non-contrast · left · 3.0mm · 0.59mm/px · z∈[-95,+25]mm · 4 of 36 slices shown (1 of 3)]
[im 1/36]
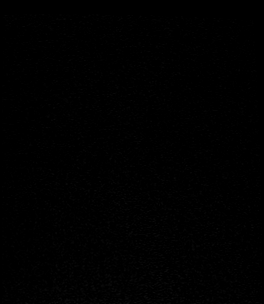
[im 12/36]
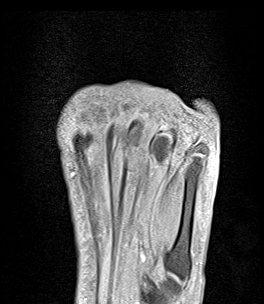
[im 24/36]
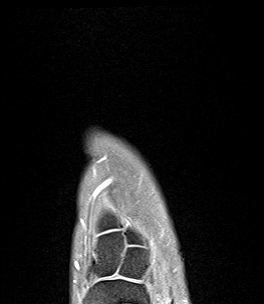
[im 36/36]
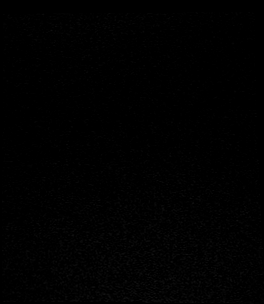

[Series 12: T1 fat-sat post-contrast · axial · left · 3.0mm · 0.59mm/px · z∈[-95,+25]mm · 4 of 36 slices shown]
[im 1/36]
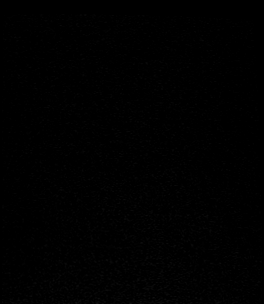
[im 12/36]
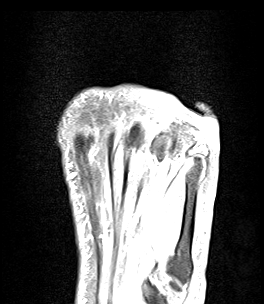
[im 24/36]
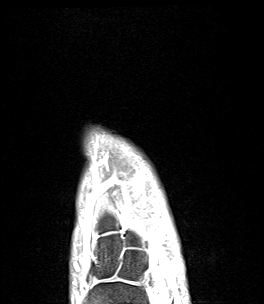
[im 36/36]
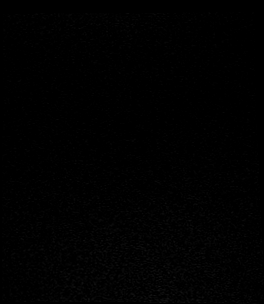

[Series 13: T1 fat-sat · sagittal · left · 3.0mm · 0.59mm/px · 5 of 42 slices shown (2 of 3)]
[im 1/42]
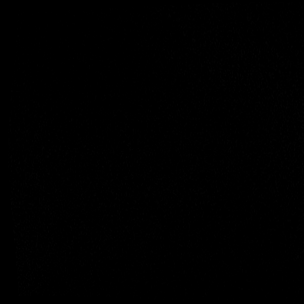
[im 11/42]
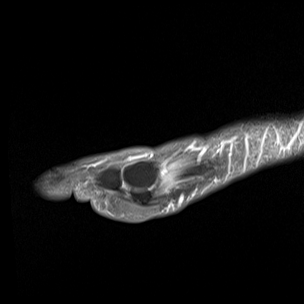
[im 21/42]
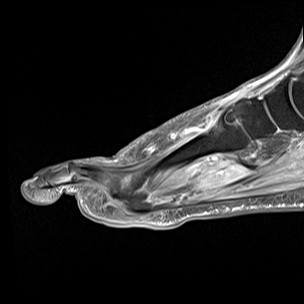
[im 31/42]
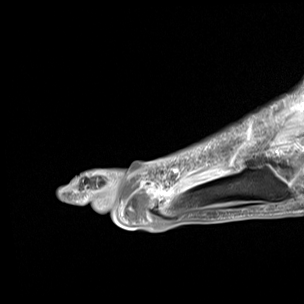
[im 42/42]
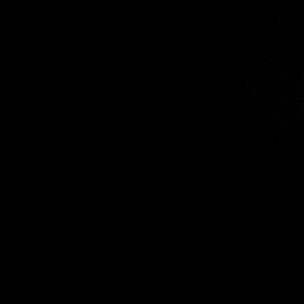

[Series 14: T1 fat-sat · coronal · left · 3.0mm · 0.56mm/px · 5 of 48 slices shown (3 of 3)]
[im 1/48]
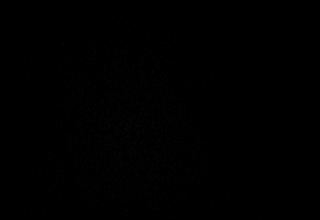
[im 12/48]
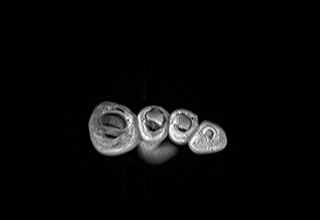
[im 24/48]
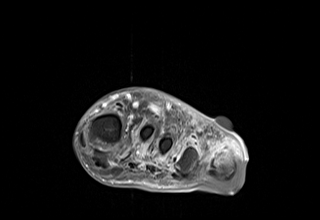
[im 36/48]
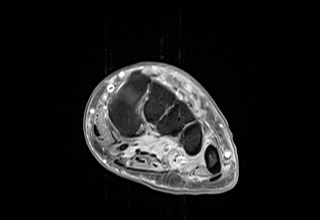
[im 48/48]
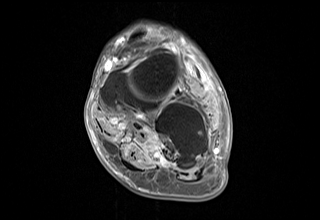

[40 of 40 positions shown; findings below may reference images not displayed]

FINDINGS: Bones/Joint/Cartilage

There is abnormal edema and abnormal enhancement of the phalangeal
bones of the little toe with peripheral enhancement and edema of the
head of the fifth metatarsal. No discrete bone destruction. No
significant joint effusions.

Ligaments

Intact.

Muscles and Tendons

And enhancement around the flexor tendon at the level of the IP
joint of the little toe consistent with tenosynovitis, likely
infectious. Slight edema and enhancement deep to the extensor tendon
at the proximal phalanx also consistent with tenosynovitis.

There is a small amount of fluid in the tendon sheath of the
extensor tendon tendons at the level the navicular and cuboid. There
is overlying soft tissue edema and slight enhancement of those
tissues which may represent cellulitis.

Soft tissues

After contrast administration there is an extensive area of poorly
perfused or devitalized soft tissue at the plantar aspect of head of
the fifth metatarsal extending over a 3.5 Cm area. This abnormal
tissue extends in the webspace between the fourth and fifth
metatarsal heads and the bases of the toes. There is a small
inhomogeneous fluid collection at the dorsal aspect of the little
toe on images 19 and 20 of series 5 which may represent a small
abscess.
IMPRESSION: 1. Osteomyelitis of the phalangeal bones of the little toe. Possible
osteomyelitis of the head of the fifth metatarsal.
2. Cellulitis of the little toe with a small abscess in the dorsal
aspect of the little toe.
3. Tenosynovitis of the flexor and extensor tendons of the little
toe, likely infected.

## 2021-12-14 ENCOUNTER — Other Ambulatory Visit (HOSPITAL_COMMUNITY): Payer: Self-pay

## 2021-12-14 IMAGING — DX DG FOOT 2V*L*
2 series · 2 of 2 positions shown · non-contrast
Comparison: 06/22/2020

CLINICAL DATA: Fifth digit amputation, postoperative evaluation

EXAM:
LEFT FOOT - 2 VIEW

[foot]
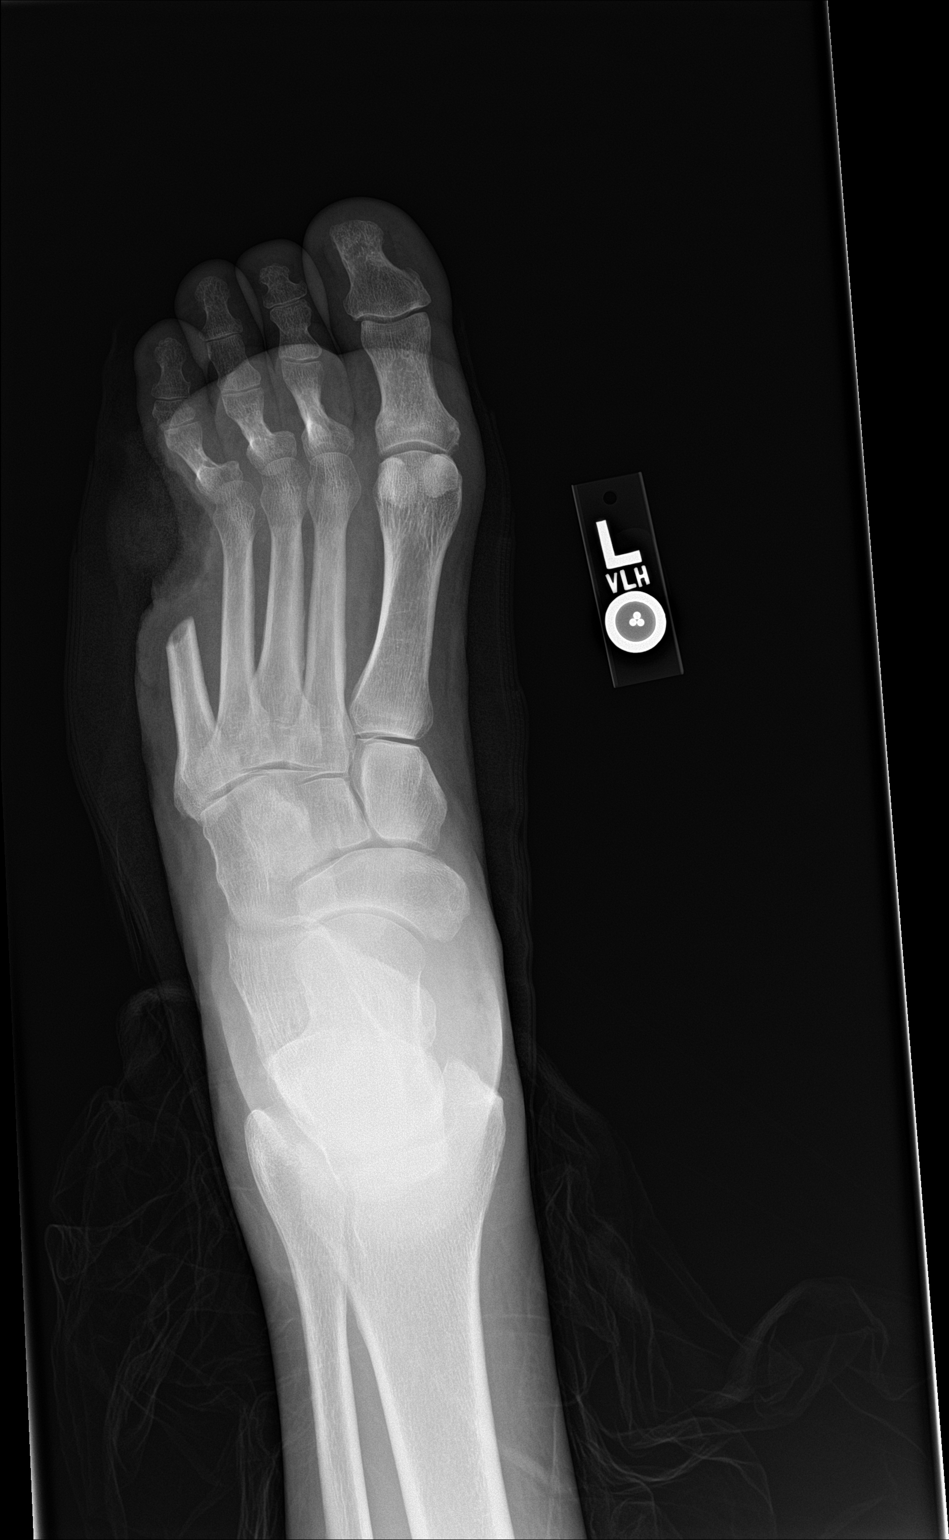

[leg]
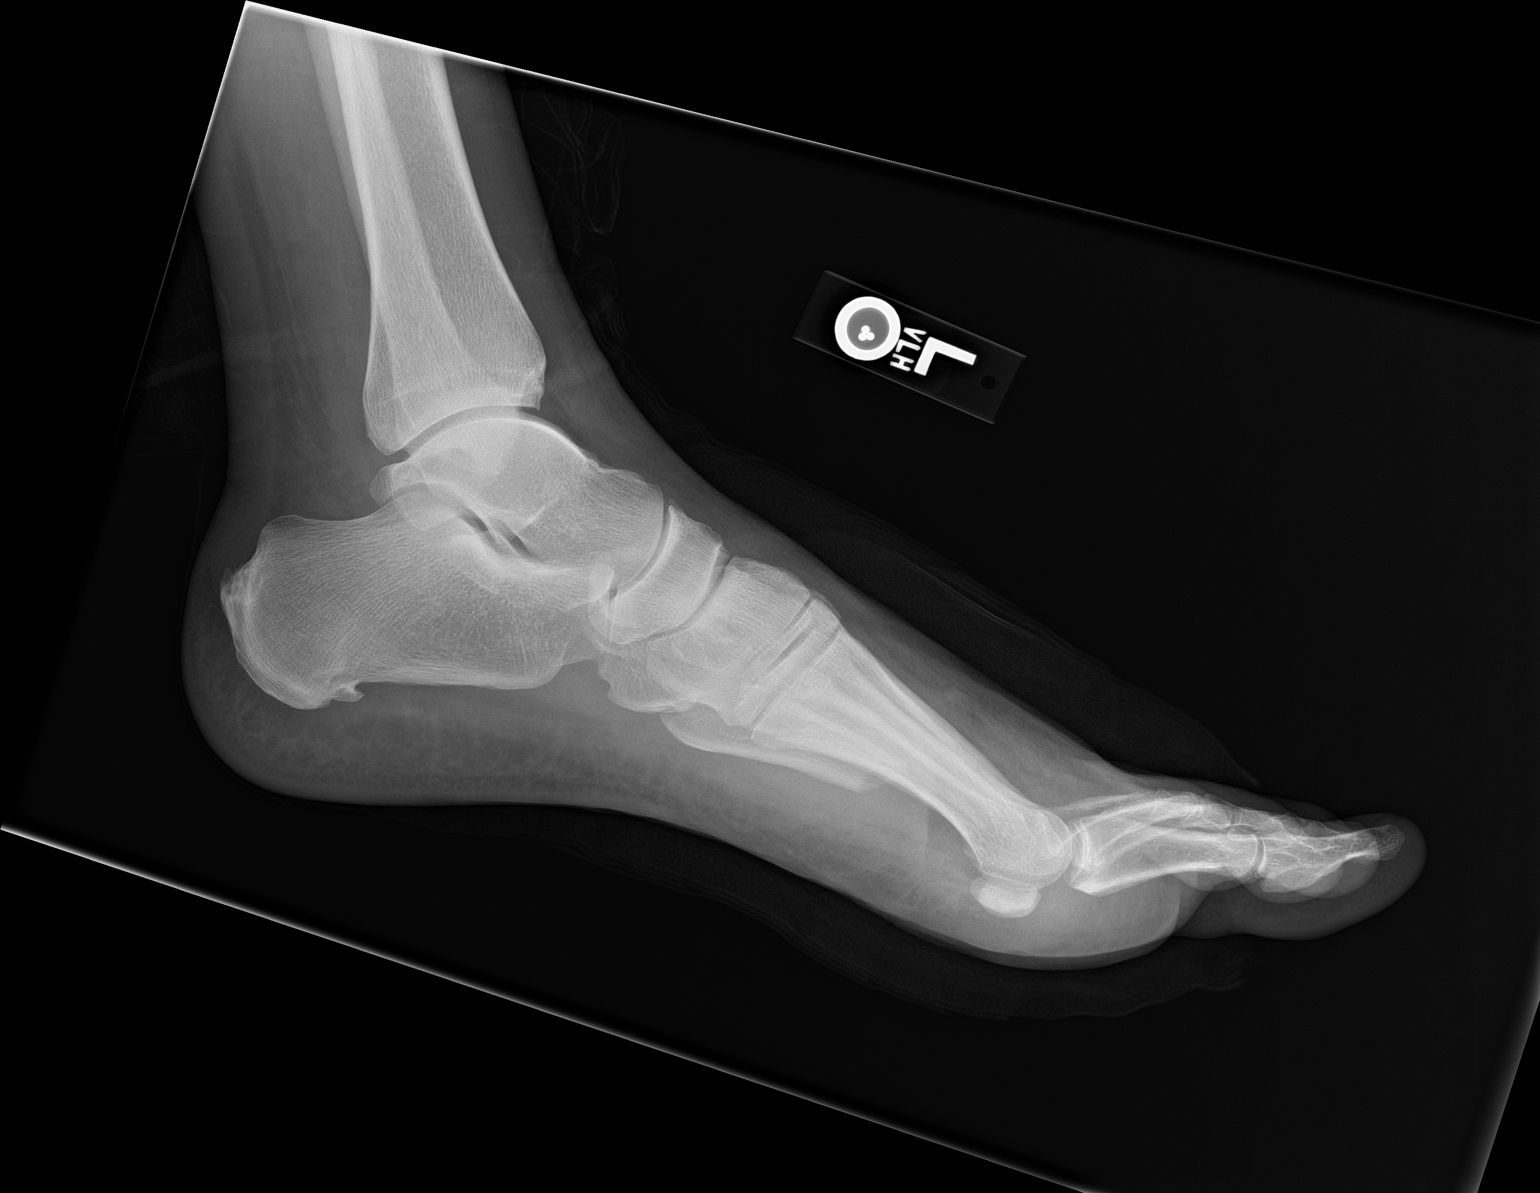

[2 of 2 positions shown; findings below may reference images not displayed]

FINDINGS: Frontal and lateral views of the left foot demonstrate interval
amputation at the level of the fifth metatarsal diaphysis. Tarsal
changes in the overlying soft tissues. No other acute or destructive
bony abnormalities.
IMPRESSION: 1. Fifth digit amputation at the level of the mid fifth metatarsal.

## 2021-12-14 NOTE — Telephone Encounter (Signed)
Called to check on status of appeal. Appeal has been denied but patient is eligible for second level appeal. Representative will fax original appeal decision to office. Once received, will submit second level appeal.   Karle Plumber, PharmD, BCPS, BCCP, CPP Heart Failure Clinic Pharmacist [phone]

## 2021-12-14 NOTE — Telephone Encounter (Signed)
Reached out to [employer] to ask them to turn down patients humidity. Tammy at [facility] will notify the RT's to contact the patient and make the change.

## 2021-12-20 ENCOUNTER — Encounter: Payer: Self-pay | Admitting: Podiatry

## 2021-12-20 ENCOUNTER — Ambulatory Visit (INDEPENDENT_AMBULATORY_CARE_PROVIDER_SITE_OTHER): Payer: No Typology Code available for payment source | Admitting: Podiatry

## 2021-12-20 DIAGNOSIS — M14671 Charcot's joint, right ankle and foot: Secondary | ICD-10-CM | POA: Diagnosis not present

## 2021-12-27 NOTE — Progress Notes (Signed)
°  Subjective:  Patient ID: [name], male    DOB: 1971/06/14,  MRN: [id number]  Chief Complaint  Patient presents with   Foot Pain    There is not any change in the left foot and the right is about the same and the swelling goes up during the day and goes down at night   DOS: 04/20/21 Procedure: Wound debridement right with closure and metatarsal resection  50 y.o. male presents for follow-up. Hx confirmed as above. Objective:  Physical Exam: tenderness at the surgical site, local edema noted and calf supple, nontender. Incision: Right lateral foot wound well healed, no edema or warmth, redness, drainage. Left 5th metatarsal ulceration remains healed. Assessment:   1. Charcot's joint of foot, right    Plan:  Patient was evaluated and treated and all questions answered.  Right foot edema; Fracture R 4th/5th Metatarsals; Charcot -Foot remains without warmth or erythema, edema. Foot structure appears stable. Unchanged arch collapse but no significant rocker bottom deformity. Will monitor arch drop for signs of callus or ulceration. Discussed possible need for reconstructive surgery in future if foot structure further collapses.  Left foot wound -Remains healed.  Return in about 1 month (around 01/20/2022) for Charcot f/u right foot.

## 2021-12-31 ENCOUNTER — Other Ambulatory Visit: Payer: Self-pay

## 2022-01-01 ENCOUNTER — Other Ambulatory Visit (HOSPITAL_COMMUNITY): Payer: Self-pay

## 2022-01-01 ENCOUNTER — Other Ambulatory Visit: Payer: Self-pay

## 2022-01-01 MED FILL — Evolocumab Subcutaneous Soln Auto-Injector 140 MG/ML: SUBCUTANEOUS | 28 days supply | Qty: 2 | Fill #7 | Status: AC

## 2022-01-02 ENCOUNTER — Other Ambulatory Visit: Payer: Self-pay

## 2022-01-05 ENCOUNTER — Encounter: Payer: Self-pay | Admitting: Podiatry

## 2022-01-06 ENCOUNTER — Encounter: Payer: Self-pay | Admitting: Podiatry

## 2022-01-07 NOTE — Telephone Encounter (Signed)
Please advise 

## 2022-01-11 ENCOUNTER — Other Ambulatory Visit: Payer: Self-pay

## 2022-01-14 ENCOUNTER — Other Ambulatory Visit: Payer: Self-pay

## 2022-01-15 NOTE — Telephone Encounter (Signed)
Advanced Heart Failure Patient Advocate Encounter  Patient and I have spoken several times throughout the appeals process for [employer]. For now, we are going to try and get him [place] approval. Sent application via docusign.

## 2022-01-22 ENCOUNTER — Encounter: Payer: Self-pay | Admitting: Student-PharmD

## 2022-01-22 ENCOUNTER — Telehealth: Payer: Self-pay | Admitting: Student-PharmD

## 2022-01-22 NOTE — Telephone Encounter (Signed)
Called patient to see how he is tolerating Ozempic 0.5 mg weekly. If tolerating this well will plan to increase to 1 mg weekly. Unable to reach patient, LVM requesting call back and will sent MyChart message.

## 2022-01-23 ENCOUNTER — Other Ambulatory Visit: Payer: Self-pay

## 2022-01-23 MED ORDER — OZEMPIC (1 MG/DOSE) 4 MG/3ML ~~LOC~~ SOPN
1.0000 mg | PEN_INJECTOR | SUBCUTANEOUS | 5 refills | Status: DC
  Filled 2022-01-23 – 2022-02-15 (×2): qty 3, 28d supply, fill #0

## 2022-01-23 NOTE — Telephone Encounter (Signed)
Pt called clinic, reports tolerating Ozempic well. Thinks he has about 2 injections left in his current pen. Hasn't been weighing himself recently. Is agreeable to increase his dose to 1mg  weekly. New rx sent to pharmacy. Asks that we follow up with him in another 6 weeks or so to discuss last dose titration up to 2mg  weekly.

## 2022-01-24 ENCOUNTER — Ambulatory Visit (INDEPENDENT_AMBULATORY_CARE_PROVIDER_SITE_OTHER): Payer: No Typology Code available for payment source | Admitting: Podiatry

## 2022-01-24 ENCOUNTER — Encounter: Payer: Self-pay | Admitting: Podiatry

## 2022-01-24 DIAGNOSIS — M14671 Charcot's joint, right ankle and foot: Secondary | ICD-10-CM | POA: Diagnosis not present

## 2022-01-24 NOTE — Telephone Encounter (Signed)
Second level appeal for [place] sent to insurance plan.   Karle Plumber, PharmD, BCPS, BCCP, CPP Heart Failure Clinic Pharmacist [phone]

## 2022-01-24 NOTE — Progress Notes (Signed)
°  Subjective:  Patient ID: Omare Bilotta, male    DOB: [birth year],  MRN: [id number]  Chief Complaint  Patient presents with   Foot Pain    There are two spots on the arch of the right foot and I sent Dr Samuella Cota pictures about a week and a half ago   DOS: 04/20/21 Procedure: Wound debridement right with closure and metatarsal resection  51 y.o. male presents for follow-up. Hx confirmed as above. Objective:  Physical Exam: tenderness at the surgical site, local edema noted and calf supple, nontender. Incision: Right lateral foot wound well healed, no edema or warmth, redness, drainage. Left 5th metatarsal ulceration remains healed.  Area of deep tissue injury about the lateral right foot about the cuboid area.  No open ulceration noted. Assessment:   1. Charcot's joint of foot, right    Plan:  Patient was evaluated and treated and all questions answered.  Right foot edema; Fracture R 4th/5th Metatarsals; Charcot -He does have some area of high pressure underneath the cuboid area.  At present ulceration.  We discussed that this area is not patient on likely ulcerate if further arch collapse continues.  Advised patient to closely monitor and inform if this worsens. -Discussed possible need for reconstructive surgery in future if foot structure further collapses. -Will make appt for new DM inserts/shoes. Will see if we can offload the insert I gave him further to prevent pressure formation. Patient to follow wound closely and let us know of any changes.  Return in about 3 weeks (around 02/14/2022).

## 2022-01-29 ENCOUNTER — Ambulatory Visit: Payer: No Typology Code available for payment source

## 2022-01-29 NOTE — Progress Notes (Signed)
Called the patient who let me know he has noticed more loose stool lately, usually on the Wednesday or Thursday after his injection on Monday. Also reports some heart burn and gas. He isn't sure if it could be related to what he is eating or just from getting used to the medicine. He said the side effects have subsided as of yesterday. He thinks he has a couple more weeks left in his 0.5 mg pen so he will continue 0.5 mg until it runs out. I will touch base with him in 2 weeks to see if he would like to try going up to 1 mg at that time or continue on 0.5 mg for longer.

## 2022-01-30 ENCOUNTER — Other Ambulatory Visit: Payer: Self-pay

## 2022-02-05 ENCOUNTER — Other Ambulatory Visit: Payer: Self-pay

## 2022-02-05 ENCOUNTER — Ambulatory Visit (INDEPENDENT_AMBULATORY_CARE_PROVIDER_SITE_OTHER): Payer: No Typology Code available for payment source

## 2022-02-05 DIAGNOSIS — E11621 Type 2 diabetes mellitus with foot ulcer: Secondary | ICD-10-CM

## 2022-02-05 DIAGNOSIS — Z899 Acquired absence of limb, unspecified: Secondary | ICD-10-CM

## 2022-02-05 DIAGNOSIS — M14672 Charcot's joint, left ankle and foot: Secondary | ICD-10-CM

## 2022-02-05 DIAGNOSIS — M14671 Charcot's joint, right ankle and foot: Secondary | ICD-10-CM

## 2022-02-05 MED FILL — Evolocumab Subcutaneous Soln Auto-Injector 140 MG/ML: SUBCUTANEOUS | 28 days supply | Qty: 2 | Fill #0 | Status: CN

## 2022-02-05 MED FILL — Evolocumab Subcutaneous Soln Auto-Injector 140 MG/ML: SUBCUTANEOUS | 28 days supply | Qty: 2 | Fill #8 | Status: CN

## 2022-02-05 NOTE — Progress Notes (Signed)
SITUATION Reason for Consult: Evaluation for Bilateral Custom Foot Orthoses Patient / Caregiver Report: Patient is ready for foot orthotics  OBJECTIVE DATA: Patient History / Diagnosis:    ICD-10-CM   1. Charcot's joint of foot, right  M14.671     2. Type 2 diabetes mellitus with foot ulcer (CODE) ([place])  E11.621     3. Status post amputation  Z89.9       Current or Previous Devices: Prefab diabetic insoles  Foot Examination: Skin presentation:   Intact Ulcers & Callousing:   None and no history Toe / Foot Deformities:  Right charcot midfoot, left 5th toe amputation Weight Bearing Presentation:  Planus Sensation:    Intact  ORTHOTIC RECOMMENDATION Recommended Device: 1x pair of custom functional foot orthotics  GOALS OF ORTHOSES - Reduce Pain - Prevent Foot Deformity - Prevent Progression of Further Foot Deformity - Relieve Pressure - Improve the Overall Biomechanical Function of the Foot and Lower Extremity.  ACTIONS PERFORMED Patient was casted for Foot Orthoses via crush box. Procedure was explained and patient tolerated procedure well. All questions were answered and concerns addressed.  PLAN Potential out of pocket cost was communicated to patient. Casts are to be sent to [facility] for fabrication. Patient is to be called for fitting when devices are ready.

## 2022-02-06 ENCOUNTER — Other Ambulatory Visit: Payer: Self-pay

## 2022-02-10 NOTE — Progress Notes (Unsigned)
Virtual Visit via Video Note   This visit type was conducted due to national recommendations for restrictions regarding the COVID-19 Pandemic (e.g. social distancing) in an effort to limit this patient's exposure and mitigate transmission in our community.  Due to his co-morbid illnesses, this patient is at least at moderate risk for complications without adequate follow up.  This format is felt to be most appropriate for this patient at this time.  All issues noted in this document were discussed and addressed.  A limited physical exam was performed with this format.  Please refer to the patient's chart for his consent to telehealth for [facility].       Date:  02/10/2022   ID:  [name], DOB 02-13-1971, MRN [id number] The patient was identified using 2 identifiers.  Patient Location: Home Provider Location: Home Office   PCP:  Storm Frisk, MD   [facility] Providers Cardiologist:  None Advanced Heart Failure:  Marca Ancona, MD  Sleep Medicine:  Armanda Magic, MD     Evaluation Performed:  Follow-Up Visit  Chief Complaint:  OSA  History of Present Illness:    [name] is a 51 y.o. male with a hx of ASCAD, chronic systolic CHF, DM2, HTN, ischemic DCM who was referred for home sleep study by Dr. Shirlee Latch.  HST showed mild OSA with an AHI of 8.8/h and O2 desaturations as low as 8 7% but did not qualify as nocturnal hypoxemia.  He was placed on auto CPAP from 4 to 15 cm H2O and is now here for follow-up.  He is doing well with he he CPAP device and thinks that he has gotten used to it.  He tolerates the mask and feels the pressure is adequate.  Since going on CPAP he feels rested in the am and has no significant daytime sleepiness.  He denies any significant mouth or nasal dryness or nasal congestion.  He does not think that he snores.     The patient does not have symptoms concerning for COVID-19 infection (fever, chills, cough, or new shortness of breath).     Past Medical History:  Diagnosis Date   Acute systolic CHF (congestive heart failure) (HCC) 10/28/2016   CAD (coronary artery disease)    NSTEMI 10/2011: LHC 11/04/11: pLAD 90%, mLAD 60-70%, small D2 sub totally occluded at ostium, small OM1 90% ostial, 90% mid, mOM2 30%, oPL 80%, RCA 30%, dPDA 80%, EF 20% with ant AK.  PCI:  Promus DES to pLAD.   Cellulitis of left foot 06/22/2020   Chronic kidney disease    CKD   Chronic systolic heart failure (HCC)    Diabetic ulcer of left midfoot associated with type 2 diabetes mellitus, with muscle involvement without evidence of necrosis (HCC)    DM2 (diabetes mellitus, type 2) (HCC)    type 2   GERD (gastroesophageal reflux disease)    HTN (hypertension)    Hyperlipidemia    Ischemic cardiomyopathy    Echo 11/03/11: mod LVH, mild focal basal septal hypertrophy, EF 15%, grade 2 diast dysfxn, mild MR, mild to mod LAE, mild RVE, mild to mod reduced RVSF.  EF 3/5 50% by echo   Myocardial infarction [facility])    Neuromuscular disorder (HCC)    neuropathy feet   Obesity    Ulcer of right foot (HCC) 06/22/2020   Past Surgical History:  Procedure Laterality Date   AMPUTATION Left 06/23/2020   Procedure: AMPUTATION RAY 5th;  Surgeon: Vivi Barrack, DPM;  Location: [place];  Service: Podiatry;  Laterality: Left;   BONE BIOPSY Left 12/20/2020   Procedure: BONE BIOPSY X 3;  Surgeon: Evelina Bucy, DPM;  [facility];  Service: Podiatry;  Laterality: Left;   CARDIAC CATHETERIZATION N/A 10/25/2016   Procedure: Right/Left Heart Cath and Coronary Angiography;  Surgeon: Leonie Man, MD;  Location: [place] CV LAB;  Service: Cardiovascular;  Laterality: N/A;   CARDIAC CATHETERIZATION N/A 10/25/2016   Procedure: Coronary Stent Intervention;  Surgeon: Leonie Man, MD;  Location: [place] CV LAB;  Service: Cardiovascular;  Laterality: N/A;   CARDIAC CATHETERIZATION N/A 10/28/2016   Procedure: Coronary Stent Intervention;  Surgeon:  Burnell Blanks, MD;  Location: [place] CV LAB;  Service: Cardiovascular;  Laterality: N/A;   GRAFT APPLICATION Left [id number]   Procedure: APPLICATION OF SKIN GRAFT USING INTEGRA BILAYER MATRIX WOUND DRESSING;  Surgeon: Evelina Bucy, DPM;  Location: [place];  Service: Podiatry;  Laterality: Left;   I & D EXTREMITY Left 07/14/2020   Procedure: IRRIGATION AND DEBRIDEMENT LEFT FOOT;  Surgeon: Evelina Bucy, DPM;  Location: [place];  Service: Podiatry;  Laterality: Left;   LEFT AND RIGHT HEART CATHETERIZATION WITH CORONARY ANGIOGRAM N/A 11/04/2011   Procedure: LEFT AND RIGHT HEART CATHETERIZATION WITH CORONARY ANGIOGRAM;  Surgeon: Minus Breeding, MD;  Location: [facility] C[facility] LAB;  Service: Cardiovascular;  Laterality: N/A;   METATARSAL HEAD EXCISION Left 01/24/2021   Procedure: 4th METATARSAL HEAD EXCISION, Proximal phalanx resection;  Surgeon: Evelina Bucy, DPM;  [facility];  Service: Podiatry;  Laterality: Left;   METATARSAL HEAD EXCISION Right 03/30/2021   Procedure: PROXIMAL 5TH TOE RESECTION ON RIGHT;  Surgeon: Evelina Bucy, DPM;  [facility];  Service: Podiatry;  Laterality: Right;   PERCUTANEOUS CORONARY STENT INTERVENTION (PCI-S)  11/04/2011   Procedure: PERCUTANEOUS CORONARY STENT INTERVENTION (PCI-S);  Surgeon: Sherren Mocha, MD;  Location: [facility] C[facility] LAB;  Service: Cardiovascular;;   WOUND DEBRIDEMENT Left 06/26/2020   Procedure: LEFT FOOT WOUND DEBRIDEMENT AND GRAFT APPLICATION;  Surgeon: Trula Slade, DPM;  Location: [place];  Service: Podiatry;  Laterality: Left;   WOUND DEBRIDEMENT Left 12/20/2020   Procedure: DEBRIDEMENT WOUND AND POSSIBLE CLOSURE;  Surgeon: Evelina Bucy, DPM;  [facility];  Service: Podiatry;  Laterality: Left;   WOUND DEBRIDEMENT Left 01/24/2021   Procedure: DEBRIDEMENT WOUND;  Surgeon: Evelina Bucy, DPM;  [facility];  Service: Podiatry;  Laterality: Left;   WOUND DEBRIDEMENT Bilateral 03/30/2021   Procedure: DEBRIDEMENT WOUND  WITH SKIN GRAFT SUBSTITUTE APPLICATION;  Surgeon: Evelina Bucy, DPM;  [facility];  Service: Podiatry;  Laterality: Bilateral;   WOUND DEBRIDEMENT Right 04/20/2021   Procedure: DEBRIDEMENT Gaylan Gerold FOOT;  Surgeon: Evelina Bucy, DPM;  [facility];  Service: Podiatry;  Laterality: Right;     No outpatient medications have been marked as taking for the 02/11/22 encounter (Appointment) with Sueanne Margarita, MD.     Allergies:   Jardiance [empagliflozin]   Social History   Tobacco Use   Smoking status: Never   Smokeless tobacco: Never  Vaping Use   Vaping Use: Never used  Substance Use Topics   Alcohol use: Not Currently    Alcohol/week: 1.0 standard drink    Types: 1 Shots of liquor per week   Drug use: No     Family Hx: The patient's family history includes Heart disease in his father and mother.  ROS:   Please see the history of  present illness.     All other systems reviewed and are negative.   Prior CV studies:   The following studies were reviewed today:  Home sleep study and PAP compliance download  Labs/Other Tests and Data Reviewed:    EKG:  No ECG reviewed.  Recent Labs: 04/20/2021: Hemoglobin 12.1; Platelets 293 06/15/2021: B Natriuretic Peptide 49.5 09/14/2021: BUN 24; Creatinine, Ser 1.16; Potassium 4.2; Sodium 138   Recent Lipid Panel Lab Results  Component Value Date/Time   CHOL 102 09/14/2021 09:50 AM   CHOL 105 12/08/2020 07:57 AM   TRIG 93 09/14/2021 09:50 AM   HDL 38 (L) 09/14/2021 09:50 AM   HDL 37 (L) 12/08/2020 07:57 AM   CHOLHDL 2.7 09/14/2021 09:50 AM   LDLCALC 45 09/14/2021 09:50 AM   LDLCALC 45 12/08/2020 07:57 AM   LDLDIRECT 117.0 06/14/2013 08:26 AM    Wt Readings from Last 3 Encounters:  11/12/21 268 lb 12.8 oz (121.9 kg)  10/31/21 272 lb 3.2 oz (123.5 kg)  09/14/21 270 lb (122.5 kg)     Risk Assessment/Calculations:          Objective:    Vital Signs:  There were no vitals taken for this visit.   VITAL  SIGNS:  reviewed GEN:  no acute distress EYES:  sclerae anicteric, EOMI - Extraocular Movements Intact RESPIRATORY:  normal respiratory effort, symmetric expansion CARDIOVASCULAR:  no peripheral edema SKIN:  no rash, lesions or ulcers. MUSCULOSKELETAL:  no obvious deformities. NEURO:  alert and oriented x 3, no obvious focal deficit PSYCH:  normal affect  ASSESSMENT & PLAN:    OSA - The patient is tolerating PAP therapy well without any problems. The PAP download performed by his DME was personally reviewed and interpreted by me today and showed an AHI of ***/hr on *** cm H2O with ***% compliance in using more than 4 hours nightly.  The patient has been using and benefiting from PAP use and will continue to benefit from therapy.   Hypertension  -BP is well controlled on exam today.   -Continue prescription drug management with Entresto 97-103 mg twice daily, spironolactone 50 mg daily daily, carvedilol 6.25 mg twice daily with as needed refills       COVID-19 Education: The signs and symptoms of COVID-19 were discussed with the patient and how to seek care for testing (follow up with PCP or arrange E-visit).  The importance of social distancing was discussed today.  Time:   Today, I have spent 15 minutes with the patient with telehealth technology discussing the above problems.     Medication Adjustments/Labs and Tests Ordered: Current medicines are reviewed at length with the patient today.  Concerns regarding medicines are outlined above.   Tests Ordered: No orders of the defined types were placed in this encounter.   Medication Changes: No orders of the defined types were placed in this encounter.   Follow Up:  In Person in 1 year(s)  Signed, Fransico Him, MD  02/10/2022 1:04 PM    [place]

## 2022-02-11 ENCOUNTER — Other Ambulatory Visit: Payer: Self-pay

## 2022-02-11 ENCOUNTER — Telehealth: Payer: No Typology Code available for payment source | Admitting: Cardiology

## 2022-02-11 DIAGNOSIS — I10 Essential (primary) hypertension: Secondary | ICD-10-CM

## 2022-02-11 DIAGNOSIS — G4733 Obstructive sleep apnea (adult) (pediatric): Secondary | ICD-10-CM

## 2022-02-12 ENCOUNTER — Telehealth: Payer: Self-pay | Admitting: Student-PharmD

## 2022-02-12 ENCOUNTER — Other Ambulatory Visit: Payer: Self-pay

## 2022-02-12 NOTE — Telephone Encounter (Signed)
Per last phone call with the patient, he should be out now of his Ozempic 0.5 mg weekly. We planned to touch base around this time to see if his diarrhea was improving and if he wanted to continue on 0.5 mg or continue on 1 mg.

## 2022-02-13 ENCOUNTER — Other Ambulatory Visit: Payer: Self-pay

## 2022-02-14 ENCOUNTER — Other Ambulatory Visit: Payer: Self-pay

## 2022-02-14 ENCOUNTER — Encounter: Payer: Self-pay | Admitting: Podiatry

## 2022-02-14 MED FILL — Evolocumab Subcutaneous Soln Auto-Injector 140 MG/ML: SUBCUTANEOUS | 28 days supply | Qty: 2 | Fill #0 | Status: AC

## 2022-02-15 ENCOUNTER — Other Ambulatory Visit: Payer: Self-pay

## 2022-02-18 ENCOUNTER — Ambulatory Visit (INDEPENDENT_AMBULATORY_CARE_PROVIDER_SITE_OTHER): Payer: Self-pay | Admitting: Podiatry

## 2022-02-18 DIAGNOSIS — Z91199 Patient's noncompliance with other medical treatment and regimen due to unspecified reason: Secondary | ICD-10-CM

## 2022-02-18 NOTE — Progress Notes (Signed)
No show

## 2022-02-19 NOTE — Telephone Encounter (Signed)
Unable to reach patient,  left voicemail requesting call back.

## 2022-02-26 NOTE — Telephone Encounter (Signed)
Spoke with the patient who said he has now increased the dose of Ozempic to 1 mg weekly with yesterday being his second week at this dose. He says he feels about the same as he did on the 0.5 mg dose. He gives his dose on Mondays, and reports that if he is going to experience GI upset it usually happens on Sunday. He hasn't noticed any foods that seem to cause this more than others. He avoids greasy foods. These symptoms certainly could be related to Ozempic but would expect them to happen the day or two after giving the injection rather than the day before it is due as the dose is likely wearing off some at that point. He wants to continue on 1 mg for now to see if he notices any improvement and we will re-evaluate in about 6 weeks. GI symptoms tend to improve with time and he wants to wait it out for now but will let us know if he experiences any worsening of symptoms. Denies any symptoms of hypoglycemia (he does not check BG at home).

## 2022-02-27 ENCOUNTER — Ambulatory Visit (INDEPENDENT_AMBULATORY_CARE_PROVIDER_SITE_OTHER): Payer: No Typology Code available for payment source | Admitting: Podiatry

## 2022-02-27 ENCOUNTER — Encounter: Payer: Self-pay | Admitting: Podiatry

## 2022-02-27 ENCOUNTER — Other Ambulatory Visit: Payer: Self-pay

## 2022-02-27 ENCOUNTER — Other Ambulatory Visit (HOSPITAL_COMMUNITY): Payer: Self-pay

## 2022-02-27 DIAGNOSIS — N183 Chronic kidney disease, stage 3 unspecified: Secondary | ICD-10-CM

## 2022-02-27 DIAGNOSIS — Z899 Acquired absence of limb, unspecified: Secondary | ICD-10-CM | POA: Diagnosis not present

## 2022-02-27 DIAGNOSIS — Z794 Long term (current) use of insulin: Secondary | ICD-10-CM

## 2022-02-27 DIAGNOSIS — M14671 Charcot's joint, right ankle and foot: Secondary | ICD-10-CM | POA: Diagnosis not present

## 2022-02-27 DIAGNOSIS — E1122 Type 2 diabetes mellitus with diabetic chronic kidney disease: Secondary | ICD-10-CM

## 2022-02-27 NOTE — Progress Notes (Signed)
?  Subjective:  ?Patient ID: [name], male    DOB: August 03, 1971,  MRN: [id number] ? ?Chief Complaint  ?Patient presents with  ? Foot Pain  ?  charcot f/u/ - patient states foot is much better now since his last visit , no pain left foot wound is now close , no pain some , no swelling , some redness not much.  ? ?Presents for follow-up of right Charcot foot. Has been following about every month with Dr. March Rummage. Relates he has been doing pretty well. All ulcerations are healed. Has a history of left fifth ray amputation. Relates the area of concern on charcot foot has gone away. Denies any warmth redness or pain in this right foot recently.  ? ?51 y.o. male presents for follow-up. Hx confirmed as above. ?Objective:  ?Physical Exam: tenderness at the surgical site, local edema noted and calf supple, nontender. ?Incision: Right lateral foot wound well healed, no edema or warmth, redness, drainage. Left 5th metatarsal ulceration remains healed.  Area of deep tissue injury about the lateral right foot about the cuboid area.  No open ulceration noted. ?Assessment:  ? ?1. Charcot's joint of foot, right   ?2. History of amputation   ?3. Controlled type 2 diabetes mellitus with stage 3 chronic kidney disease, with long-term current use of insulin ([place])   ? ?Plan:  ?Patient was evaluated and treated and all questions answered. ? ?Right foot edema;Charcot ?-He does have some area of high pressure underneath the cuboid area.  No ulcerations at present.  ?-Discussed care if he does start to have any warmth or pain in this foot and to offload and rest foot. Patient expressed understanding.  ?-Awaiting DM shoes ? Patient to follow up in about 4 weeks for re-evaluation.  ? ?Return in about 1 month (around 03/30/2022) for charcot .  ?

## 2022-03-03 NOTE — Progress Notes (Incomplete)
Established Patient Office Visit  Subjective:  Patient ID: [name], male    DOB: 11/16/1971  Age: 51 y.o. MRN: [id number]  CC: No chief complaint on file.   HPI Cray Monnin presents for  Cologuard, eye  ??DM shoes   Cardiovascular and Mediastinum    Ischemic cardiomyopathy (Chronic)      Ischemic cardiomyopathy stable at this time        Relevant Medications    atorvastatin (LIPITOR) 80 MG tablet    carvedilol (COREG) 6.25 MG tablet    furosemide (LASIX) 40 MG tablet    sacubitril-valsartan (ENTRESTO) 97-103 MG    spironolactone (ALDACTONE) 50 MG tablet    Essential hypertension      Stable blood pressure continue current blood pressure medications without change        Relevant Medications    atorvastatin (LIPITOR) 80 MG tablet    carvedilol (COREG) 6.25 MG tablet    furosemide (LASIX) 40 MG tablet    sacubitril-valsartan (ENTRESTO) 97-103 MG    spironolactone (ALDACTONE) 50 MG tablet    Chronic combined systolic and diastolic congestive heart failure (HCC)      Compensated chronic heart failure continue current cardiac medications without change        Relevant Medications    atorvastatin (LIPITOR) 80 MG tablet    carvedilol (COREG) 6.25 MG tablet    furosemide (LASIX) 40 MG tablet    sacubitril-valsartan (ENTRESTO) 97-103 MG    spironolactone (ALDACTONE) 50 MG tablet        Digestive    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)      Active periodontal disease I gave patient diet dental resources asked him to get a dental hygienist exam and cleaning        Relevant Medications    atorvastatin (LIPITOR) 80 MG tablet    glipiZIDE (GLUCOTROL) 10 MG tablet    insulin glargine (LANTUS SOLOSTAR) 100 UNIT/ML Solostar Pen        Endocrine    Controlled type 2 diabetes mellitus (HCC)      Type 2 diabetes now very close to goal at 7.1   I think Trulicity is in addition will be an excellent choice since he failed Jardiance   Continue  Lantus and glipizide for now   Check urine for microalbumin this visit   Advised patient to get a diabetic eye exam given resources       Past Medical History:  Diagnosis Date   Acute systolic CHF (congestive heart failure) ([name]) 10/28/2016   CAD (coronary artery disease)    NSTEMI 10/2011: LHC 11/04/11: pLAD 90%, mLAD 60-70%, small D2 sub totally occluded at ostium, small OM1 90% ostial, 90% mid, mOM2 30%, oPL 80%, RCA 30%, dPDA 80%, EF 20% with ant AK.  PCI:  Promus DES to pLAD.   Cellulitis of left foot 06/22/2020   Chronic kidney disease    CKD   Chronic systolic heart failure (HCC)    Diabetic ulcer of left midfoot associated with type 2 diabetes mellitus, with muscle involvement without evidence of necrosis ([place])    DM2 (diabetes mellitus, type 2) (HCC)    type 2   GERD (gastroesophageal reflux disease)    HTN (hypertension)    Hyperlipidemia    Ischemic cardiomyopathy    Echo 11/03/11: mod LVH, mild focal basal septal hypertrophy, EF 15%, grade 2 diast dysfxn, mild MR, mild to mod LAE, mild RVE, mild to mod reduced RVSF.  EF  3/5 50% by echo   Myocardial infarction [facility])    Neuromuscular disorder ([place])    neuropathy feet   Obesity    Ulcer of right foot ([place]) 06/22/2020    Past Surgical History:  Procedure Laterality Date   AMPUTATION Left 06/23/2020   Procedure: AMPUTATION RAY 5th;  Surgeon: Trula Slade, DPM;  Location: [place];  Service: Podiatry;  Laterality: Left;   BONE BIOPSY Left 12/20/2020   Procedure: BONE BIOPSY X 3;  Surgeon: Evelina Bucy, DPM;  [facility];  Service: Podiatry;  Laterality: Left;   CARDIAC CATHETERIZATION N/A 10/25/2016   Procedure: Right/Left Heart Cath and Coronary Angiography;  Surgeon: Leonie Man, MD;  Location: [place] [facility];  Service: Cardiovascular;  Laterality: N/A;   CARDIAC CATHETERIZATION N/A 10/25/2016   Procedure: Coronary Stent Intervention;  Surgeon: Leonie Man, MD;  Location: [place] [facility] LAB;  Service: Cardiovascular;  Laterality: N/A;   CARDIAC CATHETERIZATION N/A 10/28/2016   Procedure: Coronary Stent Intervention;  Surgeon: Burnell Blanks, MD;  Location: [place] CV LAB;  Service: Cardiovascular;  Laterality: N/A;   GRAFT APPLICATION Left 2/44/0102   Procedure: APPLICATION OF SKIN GRAFT USING INTEGRA BILAYER MATRIX WOUND DRESSING;  Surgeon: Evelina Bucy, DPM;  Location: [place];  Service: Podiatry;  Laterality: Left;   I & D EXTREMITY Left 07/14/2020   Procedure: IRRIGATION AND DEBRIDEMENT LEFT FOOT;  Surgeon: Evelina Bucy, DPM;  Location: [place];  Service: Podiatry;  Laterality: Left;   LEFT AND RIGHT HEART CATHETERIZATION WITH CORONARY ANGIOGRAM N/A 11/04/2011   Procedure: LEFT AND RIGHT HEART CATHETERIZATION WITH CORONARY ANGIOGRAM;  Surgeon: Minus Breeding, MD;  Location: [facility] CATH LAB;  Service: Cardiovascular;  Laterality: N/A;   METATARSAL HEAD EXCISION Left 01/24/2021   Procedure: 4th METATARSAL HEAD EXCISION, Proximal phalanx resection;  Surgeon: Evelina Bucy, DPM;  [facility];  Service: Podiatry;  Laterality: Left;   METATARSAL HEAD EXCISION Right 03/30/2021   Procedure: PROXIMAL 5TH TOE RESECTION ON RIGHT;  Surgeon: Evelina Bucy, DPM;  [facility];  Service: Podiatry;  Laterality: Right;   PERCUTANEOUS CORONARY STENT INTERVENTION (PCI-S)  11/04/2011   Procedure: PERCUTANEOUS CORONARY STENT INTERVENTION (PCI-S);  Surgeon: Sherren Mocha, MD;  Location: [facility] CATH LAB;  Service: Cardiovascular;;   WOUND DEBRIDEMENT Left 06/26/2020   Procedure: LEFT FOOT WOUND DEBRIDEMENT AND GRAFT APPLICATION;  Surgeon: Trula Slade, DPM;  Location: [place];  Service: Podiatry;  Laterality: Left;   WOUND DEBRIDEMENT Left 12/20/2020   Procedure: DEBRIDEMENT WOUND AND POSSIBLE CLOSURE;  Surgeon: Evelina Bucy, DPM;  [facility];  Service: Podiatry;  Laterality: Left;   WOUND DEBRIDEMENT Left 01/24/2021   Procedure: DEBRIDEMENT  WOUND;  Surgeon: Evelina Bucy, DPM;  [facility];  Service: Podiatry;  Laterality: Left;   WOUND DEBRIDEMENT Bilateral 03/30/2021   Procedure: DEBRIDEMENT WOUND WITH SKIN GRAFT SUBSTITUTE APPLICATION;  Surgeon: Evelina Bucy, DPM;  [facility];  Service: Podiatry;  Laterality: Bilateral;   WOUND DEBRIDEMENT Right 04/20/2021   Procedure: DEBRIDEMENT Gaylan Gerold FOOT;  Surgeon: Evelina Bucy, DPM;  [facility];  Service: Podiatry;  Laterality: Right;    Family History  Problem Relation Age of Onset   Heart disease Father    Heart disease Mother        MOTHER HAD CABG    Social History   Socioeconomic History   Marital status: Single    Spouse name: Not on file   Number  of children: Not on file   Years of education: Not on file   Highest education level: Not on file  Occupational History   Not on file  Tobacco Use   Smoking status: Never   Smokeless tobacco: Never  Vaping Use   Vaping Use: Never used  Substance and Sexual Activity   Alcohol use: Not Currently    Alcohol/week: 1.0 standard drink    Types: 1 Shots of liquor per week   Drug use: No   Sexual activity: Yes    Birth control/protection: None  Other Topics Concern   Not on file  Social History Narrative   Not on file   Social Determinants of Health   Financial Resource Strain: Not on file  Food Insecurity: Not on file  Transportation Needs: Not on file  Physical Activity: Not on file  Stress: Not on file  Social Connections: Not on file  Intimate Partner Violence: Not on file    Outpatient Medications Prior to Visit  Medication Sig Dispense Refill   acetaminophen (TYLENOL) 500 MG tablet Take 500 mg by mouth every 6 (six) hours as needed.     aspirin 81 MG chewable tablet Chew 1 tablet (81 mg total) by mouth daily. 30 tablet 6   atorvastatin (LIPITOR) 80 MG tablet TAKE 1 TABLET (80 MG TOTAL) BY MOUTH EVERY EVENING. 90 tablet 6   Blood Glucose Monitoring Suppl  (ONETOUCH VERIO) w/Device KIT Use to check blood sugar 3 times daily. 1 kit 0   carvedilol (COREG) 6.25 MG tablet TAKE 1 TABLET (6.25 MG TOTAL) BY MOUTH 2 (TWO) TIMES DAILY WITH A MEAL. 120 tablet 6   dapagliflozin propanediol (FARXIGA) 10 MG TABS tablet Take 1 tablet (10 mg total) by mouth daily. 30 tablet 11   Evolocumab 140 MG/ML SOAJ INJECT 1 PEN INTO THE SKIN EVERY 14 (FOURTEEN) DAYS. 2 mL 11   furosemide (LASIX) 40 MG tablet TAKE 1 TABLET (40 MG TOTAL) BY MOUTH DAILY. 60 tablet 3   glucose blood test strip USE AS DIRECTED 3 TIMES DAILY (Patient taking differently: USE AS DIRECTED 3 TIMES DAILY) 100 strip 12   insulin glargine (LANTUS SOLOSTAR) 100 UNIT/ML Solostar Pen INJECT 20 UNITS INTO THE SKIN DAILY. 6 mL 3   Insulin Pen Needle 31G X 5 MM MISC USE TO INJECT INSULIN AT BEDTIME. 100 each 6   Lancets (ONETOUCH DELICA PLUS [id number]) MISC USE TO CHECK BLOOD SUGAR 3 TIMES DAILY. 100 each 2   omeprazole (PRILOSEC) 20 MG capsule Take 1 capsule (20 mg total) by mouth daily. 60 capsule 4   sacubitril-valsartan (ENTRESTO) 97-103 MG Take 1 tablet by mouth 2 (two) times daily. 180 tablet 3   Semaglutide, 1 MG/DOSE, (OZEMPIC, 1 MG/DOSE,) 4 MG/3ML SOPN Inject 1 mg into the skin once a week. 3 mL 5   spironolactone (ALDACTONE) 50 MG tablet Take 1 tablet (50 mg total) by mouth daily. 60 tablet 3   Facility-Administered Medications Prior to Visit  Medication Dose Route Frequency Provider Last Rate Last Admin   vancomycin (VANCOCIN) powder    PRN Evelina Bucy, DPM   1,000 mg at 01/24/21 1411    Allergies  Allergen Reactions   Jardiance [Empagliflozin] Rash    ROS Review of Systems    Objective:    Physical Exam  There were no vitals taken for this visit. Wt Readings from Last 3 Encounters:  11/12/21 268 lb 12.8 oz (121.9 kg)  10/31/21 272 lb 3.2 oz (123.5 kg)  09/14/21  270 lb (122.5 kg)     Health Maintenance Due  Topic Date Due   OPHTHALMOLOGY EXAM  Never done    Fecal DNA (Cologuard)  Never done    There are no preventive care reminders to display for this patient.  Lab Results  Component Value Date   TSH 2.38 06/14/2013   Lab Results  Component Value Date   WBC 11.1 (H) 04/20/2021   HGB 12.1 (L) 04/20/2021   HCT 37.6 (L) 04/20/2021   MCV 80.5 04/20/2021   PLT 293 04/20/2021   Lab Results  Component Value Date   NA 138 09/14/2021   K 4.2 09/14/2021   CO2 25 09/14/2021   GLUCOSE 146 (H) 09/14/2021   BUN 24 (H) 09/14/2021   CREATININE 1.16 09/14/2021   BILITOT 0.4 12/08/2020   ALKPHOS 64 12/08/2020   AST 16 12/08/2020   ALT 20 12/08/2020   PROT 7.0 12/08/2020   ALBUMIN 4.2 12/08/2020   CALCIUM 9.3 09/14/2021   ANIONGAP 8 09/14/2021   GFR 71.35 12/02/2011   Lab Results  Component Value Date   CHOL 102 09/14/2021   Lab Results  Component Value Date   HDL 38 (L) 09/14/2021   Lab Results  Component Value Date   LDLCALC 45 09/14/2021   Lab Results  Component Value Date   TRIG 93 09/14/2021   Lab Results  Component Value Date   CHOLHDL 2.7 09/14/2021   Lab Results  Component Value Date   HGBA1C 7.2 (A) 10/31/2021      Assessment & Plan:   Problem List Items Addressed This Visit   None   No orders of the defined types were placed in this encounter.   Follow-up: No follow-ups on file.    Asencion Noble, MD

## 2022-03-05 ENCOUNTER — Ambulatory Visit: Payer: No Typology Code available for payment source | Admitting: Critical Care Medicine

## 2022-03-12 ENCOUNTER — Other Ambulatory Visit: Payer: Self-pay

## 2022-03-14 ENCOUNTER — Ambulatory Visit: Payer: No Typology Code available for payment source

## 2022-03-14 ENCOUNTER — Other Ambulatory Visit: Payer: Self-pay | Admitting: Critical Care Medicine

## 2022-03-14 ENCOUNTER — Other Ambulatory Visit: Payer: Self-pay

## 2022-03-14 ENCOUNTER — Telehealth: Payer: Self-pay | Admitting: Critical Care Medicine

## 2022-03-14 MED ORDER — LANTUS SOLOSTAR 100 UNIT/ML ~~LOC~~ SOPN: 20.0000 [IU] | PEN_INJECTOR | Freq: Every day | SUBCUTANEOUS | 0 refills | Status: DC

## 2022-03-14 MED ORDER — OZEMPIC (1 MG/DOSE) 4 MG/3ML ~~LOC~~ SOPN
1.0000 mg | PEN_INJECTOR | SUBCUTANEOUS | 0 refills | Status: DC
  Filled 2022-03-14: qty 6, 56d supply, fill #0

## 2022-03-14 MED FILL — Evolocumab Subcutaneous Soln Auto-Injector 140 MG/ML: SUBCUTANEOUS | 28 days supply | Qty: 2 | Fill #1 | Status: AC

## 2022-03-14 NOTE — Telephone Encounter (Signed)
Requested Prescriptions  ?Pending Prescriptions Disp Refills  ?? insulin glargine (LANTUS SOLOSTAR) 100 UNIT/ML Solostar Pen 15 mL 0  ?  Sig: INJECT 20 UNITS INTO THE SKIN DAILY.  ?  ? Endocrinology:  Diabetes - Insulins Passed - 03/14/2022 11:05 AM  ?  ?  Passed - HBA1C is between 0 and 7.9 and within 180 days  ?  HbA1c, POC (controlled diabetic range)  ?Date Value Ref Range Status  ?10/31/2021 7.2 (A) 0.0 - 7.0 % Final  ?   ?  ?  Passed - Valid encounter within last 6 months  ?  Recent Outpatient Visits   ?      ? 4 months ago Controlled type 2 diabetes mellitus with stage 3 chronic kidney disease, with long-term current use of insulin ([place])  ? [place] Elsie Stain, MD  ? 11 months ago Controlled type 2 diabetes mellitus with stage 3 chronic kidney disease, with long-term current use of insulin ([place])  ? [place] Elsie Stain, MD  ? 1 year ago Diabetes mellitus type 2 in obese [facility])  ? [place] Elsie Stain, MD  ? 1 year ago Diabetes mellitus type 2 in obese [facility])  ? [place], Charlane Ferretti, MD  ? 2 years ago Bilateral impacted cerumen  ? [place] Charlott Rakes, MD  ?  ?  ?Future Appointments   ?        ? In 1 month Joya Gaskins Burnett Harry, MD [place]  ?  ? ?  ?  ?  ?? Semaglutide, 1 MG/DOSE, (OZEMPIC, 1 MG/DOSE,) 4 MG/3ML SOPN 3 mL 5  ?  Sig: Inject 1 mg into the skin once a week.  ?  ? Endocrinology:  Diabetes - GLP-1 Receptor Agonists - semaglutide Failed - 03/14/2022 11:05 AM  ?  ?  Failed - HBA1C in normal range and within 180 days  ?  HbA1c, POC (controlled diabetic range)  ?Date Value Ref Range Status  ?10/31/2021 7.2 (A) 0.0 - 7.0 % Final  ?   ?  ?  Passed - Cr in normal range and within 360 days  ?  Creat  ?Date Value Ref Range Status  ?07/11/2016 1.38 (H) 0.60 - 1.35 mg/dL Final  ? ?Creatinine, Ser   ?Date Value Ref Range Status  ?09/14/2021 1.16 0.61 - 1.24 mg/dL Final  ?   ?  ?  Passed - Valid encounter within last 6 months  ?  Recent Outpatient Visits   ?      ? 4 months ago Controlled type 2 diabetes mellitus with stage 3 chronic kidney disease, with long-term current use of insulin ([place])  ? [place] Elsie Stain, MD  ? 11 months ago Controlled type 2 diabetes mellitus with stage 3 chronic kidney disease, with long-term current use of insulin ([place])  ? [place] Elsie Stain, MD  ? 1 year ago Diabetes mellitus type 2 in obese [facility])  ? [place] Elsie Stain, MD  ? 1 year ago Diabetes mellitus type 2 in obese [facility])  ? [place], Charlane Ferretti, MD  ? 2 years ago Bilateral impacted cerumen  ? [place] Charlott Rakes, MD  ?  ?  ?Future  Appointments   ?        ? In 1 month Joya Gaskins Burnett Harry, MD [place]  ?  ? ?  ?  ?  ? ? ?

## 2022-03-14 NOTE — Telephone Encounter (Signed)
Medication Refill - Medication:  ?insulin glargine (LANTUS SOLOSTAR) 100 UNIT/ML Solostar Pen  ?Semaglutide, 1 MG/DOSE, (OZEMPIC, 1 MG/DOSE,) 4 MG/3ML SOPN  ? ?Has the patient contacted their pharmacy? Yes.   ?Contact PCP ? ?Preferred Pharmacy (with phone number or street name):  ?[facility]  ?[address], [place] [address]  ?Phone:  [phone]  Fax:  [phone]  ? ?Has the patient been seen for an appointment in the last year OR does the patient have an upcoming appointment? Yes.   ? ?Agent: Please be advised that RX refills may take up to 3 business days. We ask that you follow-up with your pharmacy. ?

## 2022-03-14 NOTE — Progress Notes (Signed)
SITUATION: ?Reason for Visit: Fitting and Delivery of Custom Fabricated Foot Orthoses ?Patient Report: Patient reports comfort and is satisfied with device. ? ?OBJECTIVE DATA: ?Patient History / Diagnosis:   ?  ICD-10-CM   ?1. Charcot's joint of foot, right  M14.671   ?  ?2. Controlled type 2 diabetes mellitus with stage 3 chronic kidney disease, with long-term current use of insulin (HCC)  E11.22   ? N18.30   ? Z79.4   ?  ? ? ?Provided Device:  Custom Functional Foot Orthotics ?    [employer]: [id number] ? ?GOAL OF ORTHOSIS ?- Improve gait ?- Decrease energy expenditure ?- Improve Balance ?- Provide Triplanar stability of foot complex ?- Facilitate motion ? ?ACTIONS PERFORMED ?Patient was fit with foot orthotics trimmed to shoe last. Patient tolerated fittign procedure.  ? ?Patient was provided with verbal and written instruction and demonstration regarding donning, doffing, wear, care, proper fit, function, purpose, cleaning, and use of the orthosis and in all related precautions and risks and benefits regarding the orthosis. ? ?Patient was also provided with verbal instruction regarding how to report any failures or malfunctions of the orthosis and necessary follow up care. Patient was also instructed to contact our office regarding any change in status that may affect the function of the orthosis. ? ?Patient demonstrated independence with proper donning, doffing, and fit and verbalized understanding of all instructions. ? ?PLAN: ?Patient is to follow up in one week or as necessary (PRN). All questions were answered and concerns addressed. Plan of care was discussed with and agreed upon by the patient. ? ?

## 2022-03-14 NOTE — Telephone Encounter (Signed)
Copied from CRM [id number]. Topic: General - Call Back - No Documentation >> Mar 14, 2022 10:43 AM Randol Kern wrote: Reason for CRM: Pt wants to get a handicap placard for his foot, pt wants to know what he needs to do to receive this  Best contact: [phone]

## 2022-03-15 ENCOUNTER — Other Ambulatory Visit: Payer: Self-pay

## 2022-03-15 NOTE — Telephone Encounter (Signed)
Called pt and let him know that you out of office today, that he will come by the office Monday afternoon to pick paperwork up  ?

## 2022-03-17 ENCOUNTER — Encounter: Payer: Self-pay | Admitting: Critical Care Medicine

## 2022-03-18 ENCOUNTER — Ambulatory Visit: Payer: Self-pay

## 2022-03-18 NOTE — Telephone Encounter (Signed)
Needs video visit this week , tomorrow afternoon is ok ?

## 2022-03-18 NOTE — Telephone Encounter (Signed)
?  Chief Complaint: sinus congestion ?Symptoms: head and chest congestion, yellow mucus, coughing, body aches ?Frequency: since Thursday ?Pertinent Negatives: Patient denies fever or SOB ?Disposition: [] ED /[] Urgent Care (no appt availability in office) / [x] Appointment(In office/virtual)/ []  [facility]/ [] Home Care/ [] Refused Recommended Disposition /[] [place] [facility]/ []  Follow-up with PCP ?Additional Notes: Pt states he seen Dr. Joya Gaskins and told him to a virtual visit. Pt has been taking Robitussin but not helping with symptoms.  ? ?Summary: chest and head congestion  ? Pt called saying he has congestion in his head and chest.  He said Dr. Joya Gaskins told him to call and make an appt for a virtual visit but there are no appts this morning.  ? ?Phone [phone]   ?  ? ? ?Reason for Disposition ? [1] Nasal discharge AND [2] present > 10 days ? ?Answer Assessment - Initial Assessment Questions ?1. LOCATION: "Where does it hurt?"  ?    NA ?2. ONSET: "When did the sinus pain start?"  (e.g., hours, days)  ?    Thursday  ?4. RECURRENT SYMPTOM: "Have you ever had sinus problems before?" If Yes, ask: "When was the last time?" and "What happened that time?"  ?    Yes abx ?5. NASAL CONGESTION: "Is the nose blocked?" If Yes, ask: "Can you open it or must you breathe through your mouth?" ?    yes ?6. NASAL DISCHARGE: "Do you have discharge from your nose?" If so ask, "What color?" ?    Yes , yellow ?7. FEVER: "Do you have a fever?" If Yes, ask: "What is it, how was it measured, and when did it start?"  ?    no ?8. OTHER SYMPTOMS: "Do you have any other symptoms?" (e.g., sore throat, cough, earache, difficulty breathing) ?Cough congestion ? ?Protocols used: Sinus Pain or Congestion-A-AH ? ?

## 2022-03-19 ENCOUNTER — Ambulatory Visit: Payer: No Typology Code available for payment source | Attending: Nurse Practitioner | Admitting: Nurse Practitioner

## 2022-03-19 ENCOUNTER — Other Ambulatory Visit: Payer: Self-pay

## 2022-03-19 ENCOUNTER — Encounter: Payer: Self-pay | Admitting: Nurse Practitioner

## 2022-03-19 DIAGNOSIS — J069 Acute upper respiratory infection, unspecified: Secondary | ICD-10-CM

## 2022-03-19 MED ORDER — AZITHROMYCIN 250 MG PO TABS
ORAL_TABLET | ORAL | 0 refills | Status: AC
Start: 2022-03-19 — End: 2022-03-24
  Filled 2022-03-19: qty 6, 5d supply, fill #0

## 2022-03-19 MED ORDER — PROMETHAZINE-DM 6.25-15 MG/5ML PO SYRP
5.0000 mL | ORAL_SOLUTION | Freq: Four times a day (QID) | ORAL | 0 refills | Status: DC | PRN
Start: 2022-03-19 — End: 2022-08-27
  Filled 2022-03-19: qty 118, 6d supply, fill #0

## 2022-03-19 NOTE — Progress Notes (Signed)
Virtual Visit via Telephone Note ?Due to national recommendations of social distancing due to [place] 19, telehealth visit is felt to be most appropriate for this patient at this time.  I discussed the limitations, risks, security and privacy concerns of performing an evaluation and management service by telephone and the availability of in person appointments. I also discussed with the patient that there may be a patient responsible charge related to this service. The patient expressed understanding and agreed to proceed.  ? ? ?I connected with [name] on 03/19/22  at   9:50 AM EDT  EDT by telephone and verified that I am speaking with the correct person using two identifiers. ? ?Location of Patient: ?Private Residence ?  ?Location of Provider: ?Scientist, research (physical sciences) and [employer] Office  ?  ?Persons participating in Telemedicine visit: ?Geryl Rankins FNP-BC ?[name]  ?  ?History of Present Illness: ?Telemedicine visit for: URI ? ?Patient complains of symptoms of a URI. Symptoms include congestion, cough, and plugged sensation in both ears. Onset of symptoms was several days ago, gradually worsening since that time. He also c/o cough described as productive of tenacious sputum, nasal congestion,  and shortness of breath for the past 5 days .  He is drinking plenty of fluids. Evaluation to date: none. Treatment to date:  OTC COUGH MEDICATIONS . ? ?Past Medical History:  ?Diagnosis Date  ? Acute systolic CHF (congestive heart failure) ([place]) 10/28/2016  ? CAD (coronary artery disease)   ? NSTEMI 10/2011: [place] 11/04/11: pLAD 90%, mLAD 60-70%, small D2 sub totally occluded at ostium, small OM1 90% ostial, 90% mid, mOM2 30%, oPL 80%, RCA 30%, dPDA 80%, EF 20% with ant AK.  PCI:  Promus DES to pLAD.  ? Cellulitis of left foot 06/22/2020  ? Chronic kidney disease   ? CKD  ? Chronic systolic heart failure ([place])   ? Diabetic ulcer of left midfoot associated with type 2 diabetes mellitus, with muscle involvement  without evidence of necrosis ([place])   ? DM2 (diabetes mellitus, type 2) ([place])   ? type 2  ? GERD (gastroesophageal reflux disease)   ? HTN (hypertension)   ? Hyperlipidemia   ? Ischemic cardiomyopathy   ? Echo 11/03/11: mod LVH, mild focal basal septal hypertrophy, EF 15%, grade 2 diast dysfxn, mild MR, mild to mod LAE, mild RVE, mild to mod reduced RVSF.  EF 3/5 50% by echo  ? Myocardial infarction [facility])   ? Neuromuscular disorder ([place])   ? neuropathy feet  ? Obesity   ? Ulcer of right foot ([place]) 06/22/2020  ?  ?Past Surgical History:  ?Procedure Laterality Date  ? AMPUTATION Left 06/23/2020  ? Procedure: AMPUTATION RAY 5th;  Surgeon: Trula Slade, DPM;  Location: [place];  Service: Podiatry;  Laterality: Left;  ? BONE BIOPSY Left 12/20/2020  ? Procedure: BONE BIOPSY X 3;  Surgeon: Evelina Bucy, DPM;  [facility];  Service: Podiatry;  Laterality: Left;  ? CARDIAC CATHETERIZATION N/A 10/25/2016  ? Procedure: Right/Left Heart Cath and Coronary Angiography;  Surgeon: Leonie Man, MD;  Location: [place] [facility];  Service: Cardiovascular;  Laterality: N/A;  ? CARDIAC CATHETERIZATION N/A 10/25/2016  ? Procedure: Coronary Stent Intervention;  Surgeon: Leonie Man, MD;  Location: [place] [facility];  Service: Cardiovascular;  Laterality: N/A;  ? CARDIAC CATHETERIZATION N/A 10/28/2016  ? Procedure: Coronary Stent Intervention;  Surgeon: Burnell Blanks, MD;  Location: [place] [facility];  Service: Cardiovascular;  Laterality: N/A;  ?  GRAFT APPLICATION Left [id number]  ? Procedure: APPLICATION OF SKIN GRAFT USING [employer] BILAYER MATRIX WOUND DRESSING;  Surgeon: Evelina Bucy, DPM;  Location: [place];  Service: Podiatry;  Laterality: Left;  ? I & D EXTREMITY Left 07/14/2020  ? Procedure: IRRIGATION AND DEBRIDEMENT LEFT FOOT;  Surgeon: Evelina Bucy, DPM;  Location: [facility] [place];  Service: Podiatry;  Laterality: Left;  ? LEFT AND RIGHT HEART CATHETERIZATION WITH CORONARY ANGIOGRAM N/A 11/04/2011  ?  Procedure: LEFT AND RIGHT HEART CATHETERIZATION WITH CORONARY ANGIOGRAM;  Surgeon: Minus Breeding, MD;  Location: [facility] CATH LAB;  Service: Cardiovascular;  Laterality: N/A;  ? METATARSAL HEAD EXCISION Left 01/24/2021  ? Procedure: 4th METATARSAL HEAD EXCISION, Proximal phalanx resection;  Surgeon: Evelina Bucy, DPM;  [facility];  Service: Podiatry;  Laterality: Left;  ? METATARSAL HEAD EXCISION Right 03/30/2021  ? Procedure: PROXIMAL 5TH TOE RESECTION ON RIGHT;  Surgeon: Evelina Bucy, DPM;  [facility];  Service: Podiatry;  Laterality: Right;  ? PERCUTANEOUS CORONARY STENT INTERVENTION (PCI-S)  11/04/2011  ? Procedure: PERCUTANEOUS CORONARY STENT INTERVENTION (PCI-S);  Surgeon: Sherren Mocha, MD;  Location: [facility] C[facility] LAB;  Service: Cardiovascular;;  ? WOUND DEBRIDEMENT Left 06/26/2020  ? Procedure: LEFT FOOT WOUND DEBRIDEMENT AND GRAFT APPLICATION;  Surgeon: Trula Slade, DPM;  Location: [place];  Service: Podiatry;  Laterality: Left;  ? WOUND DEBRIDEMENT Left 12/20/2020  ? Procedure: DEBRIDEMENT WOUND AND POSSIBLE CLOSURE;  Surgeon: Evelina Bucy, DPM;  [facility];  Service: Podiatry;  Laterality: Left;  ? WOUND DEBRIDEMENT Left 01/24/2021  ? Procedure: DEBRIDEMENT WOUND;  Surgeon: Evelina Bucy, DPM;  [facility];  Service: Podiatry;  Laterality: Left;  ? WOUND DEBRIDEMENT Bilateral 03/30/2021  ? Procedure: DEBRIDEMENT WOUND WITH SKIN GRAFT SUBSTITUTE APPLICATION;  Surgeon: Evelina Bucy, DPM;  [facility];  Service: Podiatry;  Laterality: Bilateral;  ? WOUND DEBRIDEMENT Right 04/20/2021  ? Procedure: DEBRIDEMENT WOUND,RIGHT FOOT;  Surgeon: Evelina Bucy, DPM;  [facility];  Service: Podiatry;  Laterality: Right;  ?  ?Family History  ?Problem Relation Age of Onset  ? Heart disease Father   ? Heart disease Mother   ?     MOTHER HAD CABG  ?  ?Social History  ? ?Socioeconomic History  ? Marital status: Single  ?  Spouse name: Not on file  ? Number of children: Not on file   ? Years of education: Not on file  ? Highest education level: Not on file  ?Occupational History  ? Not on file  ?Tobacco Use  ? Smoking status: Never  ? Smokeless tobacco: Never  ?Vaping Use  ? Vaping Use: Never used  ?Substance and Sexual Activity  ? Alcohol use: Not Currently  ?  Alcohol/week: 1.0 standard drink  ?  Types: 1 Shots of liquor per week  ? Drug use: No  ? Sexual activity: Yes  ?  Birth control/protection: None  ?Other Topics Concern  ? Not on file  ?Social History Narrative  ? Not on file  ? ?Social Determinants of Health  ? ?Financial Resource Strain: Not on file  ?Food Insecurity: Not on file  ?Transportation Needs: Not on file  ?Physical Activity: Not on file  ?Stress: Not on file  ?Social Connections: Not on file  ?  ? ?Observations/Objective: ?Awake, alert and oriented x 3 ? ? ?Review of Systems  ?Constitutional:  Positive for malaise/fatigue. Negative for chills and fever.  ?HENT:  Positive for congestion and sinus pain. Negative  for ear discharge, ear pain, hearing loss and sore throat.   ?Eyes: Negative.   ?Respiratory:  Positive for cough and shortness of breath. Negative for sputum production and wheezing.   ?Cardiovascular: Negative.  Negative for chest pain, orthopnea and leg swelling.  ?Gastrointestinal: Negative.  Negative for abdominal pain, diarrhea, nausea and vomiting.  ?Neurological:  Positive for headaches. Negative for dizziness and focal weakness.  ?Endo/Heme/Allergies:  Negative for environmental allergies.  ?Psychiatric/Behavioral: Negative.     ?Assessment and Plan: ?Diagnoses and all orders for this visit: ? ?Upper respiratory tract infection, unspecified type ?-     promethazine-dextromethorphan (PROMETHAZINE-DM) 6.25-15 MG/5ML syrup; Take 5 mLs by mouth 4 (four) times daily as needed for cough. ?-     azithromycin (ZITHROMAX) 250 MG tablet; Take 2 tablets on day 1, then 1 tablet daily on days 2 through 5 ?Due to chronic medical conditions and current persistent symptoms  will treat as bacterial URI ?  ? ?Follow Up Instructions ?Return if symptoms worsen or fail to improve.  ? ?  ?I discussed the assessment and treatment plan with the patient. The patient was provided an opport

## 2022-03-26 ENCOUNTER — Other Ambulatory Visit (HOSPITAL_COMMUNITY): Payer: Self-pay

## 2022-03-26 NOTE — Telephone Encounter (Signed)
Advanced Heart Failure Patient Advocate Encounter ? ?Multiple appeals for Wilder Glade have been denied even though patient has failed Jardiance. Will unfortunately be unable to start [place] unless patient's insurance changes at a later date.    ? ?Audry Riles, PharmD, BCPS, BCCP, CPP ?Heart Failure Clinic Pharmacist ?[phone] ? ? ?

## 2022-04-01 ENCOUNTER — Ambulatory Visit (INDEPENDENT_AMBULATORY_CARE_PROVIDER_SITE_OTHER): Payer: No Typology Code available for payment source | Admitting: Podiatry

## 2022-04-01 ENCOUNTER — Other Ambulatory Visit: Payer: Self-pay

## 2022-04-01 ENCOUNTER — Encounter: Payer: Self-pay | Admitting: Podiatry

## 2022-04-01 DIAGNOSIS — M14671 Charcot's joint, right ankle and foot: Secondary | ICD-10-CM | POA: Diagnosis not present

## 2022-04-01 DIAGNOSIS — Z899 Acquired absence of limb, unspecified: Secondary | ICD-10-CM | POA: Diagnosis not present

## 2022-04-01 DIAGNOSIS — E11621 Type 2 diabetes mellitus with foot ulcer: Secondary | ICD-10-CM | POA: Diagnosis not present

## 2022-04-01 NOTE — Progress Notes (Signed)
?  Subjective:  ?Patient ID: [name], male    DOB: 1971/10/25,  MRN: [id number] ? ?Chief Complaint  ?Patient presents with  ? Routine Post Op  ?  There is a separation split on the left foot area  ? ?Presents for follow-up of right Charcot foot.. Relates he has been doing pretty well. All ulcerations are healed. Has a history of left fifth ray amputation. There is an area of separation of splitting he was worried about on right foot. Denies any warmth redness or pain in this right foot recently.  ? ?51 y.o. male presents for follow-up. Hx confirmed as above. ?Objective:  ?Physical Exam: tenderness at the surgical site, local edema noted and calf supple, nontender. ?Incision: Right lateral foot wound well healed, no edema or warmth, redness, drainage. Left 5th metatarsal ulceration remains healed.  Area of deep tissue injury about the lateral right foot about the cuboid area.  No open ulceration noted. ?Assessment:  ? ?1. Charcot's joint of foot, right   ?2. Type 2 diabetes mellitus with foot ulcer (CODE) (HCC)   ?3. History of amputation   ? ?Plan:  ?Patient was evaluated and treated and all questions answered. ? ?Right foot edema;Charcot ?-He does have some area of high pressure underneath the cuboid area.  No ulcerations at present.  ?-Discussed care if he does start to have any warmth or pain in this foot and to offload and rest foot. Patient expressed understanding.  ?-Awaiting DM shoes ? Patient to follow up in about 4 weeks for re-evaluation.  ? ?Return in about 1 month (around 05/01/2022) for [place] .  ?

## 2022-04-02 ENCOUNTER — Telehealth: Payer: Self-pay | Admitting: Student-PharmD

## 2022-04-02 NOTE — Telephone Encounter (Signed)
Called patient to see how he is doing on Ozempic 1 mg weekly. Last month when we talked over the phone he reported some GI upset that tended to happen the day before his injection was due. He wanted to continue on the 1 mg dose because he said he experienced the same side effects on the 0.5 mg dose.  ? ?Today he reports still having GI upset that now occurs randomly throughout the week but may happen once per week (diarrhea, heartburn). He had one episode of vomiting last week which was the first time that has happened since starting Ozempic. Discussed if he would like to decrease the dose back to see if symptoms improve but he says they were no different on the lower doses. Asked if he would like to discontinue the medication altogether. For now he wants to continue on 1 mg weekly and will see if his symptoms improve over time. He said he wants to wait and see what his A1c and weight are at his PCP visit with Dr. Delford Field, who is now prescribing his Ozempic, on 05/09/22 to see if he wants to continue it to benefit his diabetes. Will therefore defer future adjustments to PCP and have patient follow up with [place] just as needed.  ?

## 2022-04-04 ENCOUNTER — Other Ambulatory Visit: Payer: Self-pay

## 2022-04-09 ENCOUNTER — Telehealth: Payer: Self-pay | Admitting: Student-PharmD

## 2022-04-09 NOTE — Telephone Encounter (Signed)
Patient called saying he has decided to stop taking Ozempic due to his GI adverse effects not improving. I agree with the patient that discontinuing Ozempic would be best for him. I will remove it from his medication list. I encouraged him to speak with his PCP at his upcoming appointment to see if he will need any other medication adjustments to help control his DM pending his updated A1c.  ?

## 2022-04-10 ENCOUNTER — Other Ambulatory Visit: Payer: Self-pay | Admitting: Critical Care Medicine

## 2022-04-10 ENCOUNTER — Other Ambulatory Visit: Payer: Self-pay

## 2022-04-10 MED ORDER — REPATHA SURECLICK 140 MG/ML ~~LOC~~ SOAJ
SUBCUTANEOUS | 11 refills | Status: DC
  Filled 2022-04-10: qty 2, 28d supply, fill #0
  Filled 2022-05-14: qty 2, 28d supply, fill #1
  Filled 2022-07-04: qty 2, 28d supply, fill #2
  Filled 2022-08-06: qty 2, 28d supply, fill #3
  Filled 2022-09-09 – 2022-11-20 (×12): qty 2, 28d supply, fill #4

## 2022-04-29 ENCOUNTER — Encounter: Payer: Self-pay | Admitting: Podiatry

## 2022-04-29 ENCOUNTER — Ambulatory Visit (INDEPENDENT_AMBULATORY_CARE_PROVIDER_SITE_OTHER): Payer: No Typology Code available for payment source | Admitting: Podiatry

## 2022-04-29 ENCOUNTER — Ambulatory Visit (INDEPENDENT_AMBULATORY_CARE_PROVIDER_SITE_OTHER): Payer: No Typology Code available for payment source

## 2022-04-29 ENCOUNTER — Other Ambulatory Visit: Payer: Self-pay

## 2022-04-29 DIAGNOSIS — M7672 Peroneal tendinitis, left leg: Secondary | ICD-10-CM

## 2022-04-29 DIAGNOSIS — M14671 Charcot's joint, right ankle and foot: Secondary | ICD-10-CM

## 2022-04-29 DIAGNOSIS — E11621 Type 2 diabetes mellitus with foot ulcer: Secondary | ICD-10-CM | POA: Diagnosis not present

## 2022-04-29 DIAGNOSIS — S99912A Unspecified injury of left ankle, initial encounter: Secondary | ICD-10-CM

## 2022-04-29 MED ORDER — MELOXICAM 15 MG PO TABS
15.0000 mg | ORAL_TABLET | Freq: Every day | ORAL | 0 refills | Status: DC
  Filled 2022-04-29 – 2022-05-09 (×2): qty 30, 30d supply, fill #0

## 2022-04-29 NOTE — Progress Notes (Signed)
?  Subjective:  ?Patient ID: [name], male    DOB: 1971-04-12,  MRN: [id number] ? ?Chief Complaint  ?Patient presents with  ? Foot Pain  ?  The left foot I was in the drive way on last Friday and went to turn and put weight on the foot and heard a pop and pain in calf on Saturday and stayed off of it all week end and no swelling  ? ?Presents for follow-up of right Charcot foot.. Relates he has been doing pretty well. Relates on Friday he twisted his ankle and heard a pop. Relates it has been a little painful but is improving. Has been staying off of it.  Denies any warmth redness or pain in this right foot recently.  ? ?51 y.o. male presents for follow-up. Hx confirmed as above. ?Objective:  ?Physical Exam: tenderness at the surgical site, local edema noted and calf supple, nontender. ?Incision: Right lateral foot wound well healed, no edema or warmth, redness, drainage. Left 5th metatarsal ulceration remains healed.  Area of deep tissue injury about the lateral right foot about the cuboid area.  No open ulceration noted. Tender to the peroneal tendon around lateral malleolus .Some pain with inversion.  ?Assessment:  ? ?1. Peroneal tendonitis, left   ?2. Charcot's joint of foot, right   ?3. Type 2 diabetes mellitus with foot ulcer (CODE) (HCC)   ? ?Plan:  ?Patient was evaluated and treated and all questions answered. ? ?Right foot edema;Charcot ?-He does have some area of high pressure underneath the cuboid area.  No ulcerations at present.  ?-Discussed care if he does start to have any warmth or pain in this foot and to offload and rest foot. Patient expressed understanding.  ?-Awaiting DM shoes ?X-rays reviewed and discussed with patient. ?Discussed peroneal tendinitis and treatment options at length with patient ?Prescription for meloxicam provided ? Patient to follow up in about 4 weeks for re-evaluation.  ? ?Return in about 5 weeks (around 06/03/2022).  ?

## 2022-05-02 ENCOUNTER — Other Ambulatory Visit: Payer: Self-pay | Admitting: Podiatry

## 2022-05-02 DIAGNOSIS — M7672 Peroneal tendinitis, left leg: Secondary | ICD-10-CM

## 2022-05-06 ENCOUNTER — Other Ambulatory Visit: Payer: Self-pay

## 2022-05-08 ENCOUNTER — Other Ambulatory Visit: Payer: Self-pay

## 2022-05-09 ENCOUNTER — Other Ambulatory Visit: Payer: Self-pay

## 2022-05-09 ENCOUNTER — Ambulatory Visit
Payer: No Typology Code available for payment source | Attending: Critical Care Medicine | Admitting: Critical Care Medicine

## 2022-05-09 ENCOUNTER — Telehealth (HOSPITAL_COMMUNITY): Payer: Self-pay | Admitting: Pharmacy Technician

## 2022-05-09 ENCOUNTER — Encounter: Payer: Self-pay | Admitting: Critical Care Medicine

## 2022-05-09 VITALS — BP 109/73 | HR 88 | Wt 279.2 lb

## 2022-05-09 DIAGNOSIS — E1169 Type 2 diabetes mellitus with other specified complication: Secondary | ICD-10-CM | POA: Diagnosis not present

## 2022-05-09 DIAGNOSIS — Z1211 Encounter for screening for malignant neoplasm of colon: Secondary | ICD-10-CM

## 2022-05-09 DIAGNOSIS — M86272 Subacute osteomyelitis, left ankle and foot: Secondary | ICD-10-CM | POA: Diagnosis not present

## 2022-05-09 DIAGNOSIS — I5022 Chronic systolic (congestive) heart failure: Secondary | ICD-10-CM

## 2022-05-09 DIAGNOSIS — I5042 Chronic combined systolic (congestive) and diastolic (congestive) heart failure: Secondary | ICD-10-CM

## 2022-05-09 DIAGNOSIS — Z6832 Body mass index (BMI) 32.0-32.9, adult: Secondary | ICD-10-CM

## 2022-05-09 DIAGNOSIS — E669 Obesity, unspecified: Secondary | ICD-10-CM

## 2022-05-09 DIAGNOSIS — M869 Osteomyelitis, unspecified: Secondary | ICD-10-CM

## 2022-05-09 DIAGNOSIS — N183 Chronic kidney disease, stage 3 unspecified: Secondary | ICD-10-CM

## 2022-05-09 DIAGNOSIS — E11621 Type 2 diabetes mellitus with foot ulcer: Secondary | ICD-10-CM | POA: Diagnosis not present

## 2022-05-09 DIAGNOSIS — E1165 Type 2 diabetes mellitus with hyperglycemia: Secondary | ICD-10-CM

## 2022-05-09 DIAGNOSIS — E782 Mixed hyperlipidemia: Secondary | ICD-10-CM

## 2022-05-09 DIAGNOSIS — I10 Essential (primary) hypertension: Secondary | ICD-10-CM

## 2022-05-09 DIAGNOSIS — L97421 Non-pressure chronic ulcer of left heel and midfoot limited to breakdown of skin: Secondary | ICD-10-CM

## 2022-05-09 LAB — POCT GLYCOSYLATED HEMOGLOBIN (HGB A1C): HbA1c, POC (controlled diabetic range): 10.8 % — AB (ref 0.0–7.0)

## 2022-05-09 LAB — GLUCOSE, POCT (MANUAL RESULT ENTRY): POC Glucose: 393 mg/dl — AB (ref 70–99)

## 2022-05-09 MED ORDER — INSULIN LISPRO (1 UNIT DIAL) 100 UNIT/ML (KWIKPEN)
8.0000 [IU] | PEN_INJECTOR | Freq: Three times a day (TID) | SUBCUTANEOUS | 11 refills | Status: DC
  Filled 2022-05-09: qty 15, 63d supply, fill #0
  Filled 2022-07-04: qty 15, 63d supply, fill #1
  Filled 2022-09-15: qty 15, 63d supply, fill #2

## 2022-05-09 MED ORDER — LANTUS SOLOSTAR 100 UNIT/ML ~~LOC~~ SOPN
30.0000 [IU] | PEN_INJECTOR | Freq: Every day | SUBCUTANEOUS | 3 refills | Status: DC
  Filled 2022-05-09: qty 15, 50d supply, fill #0
  Filled 2022-07-04: qty 15, 50d supply, fill #1
  Filled 2022-08-06: qty 15, 50d supply, fill #2
  Filled 2022-08-15: qty 9, 30d supply, fill #2
  Filled 2022-08-15: qty 15, 50d supply, fill #2

## 2022-05-09 NOTE — Assessment & Plan Note (Signed)
Continue to monitor

## 2022-05-09 NOTE — Patient Instructions (Addendum)
I will call the insurance company and see if we can get your Wilder Glade approved ? ?Begin insulin lispro 8 units before each meal check your blood sugar if less than 150 hold the dose ? ?Increase insulin glargine to 30 units daily ? ?No change in other medications stay on your heart failure and blood pressure medicines as prescribed ? ?A referral to endocrinology diabetic specialist we made ? ?Colon cancer screening will be obtained pick up a kit on the way out process and return ? ?Make sure you get an eye exam in this calendar year 2 diabetes ? ?Follow lifestyle medicine diet per handout we gave you today ? ?Return to see Lurena Joiner our clinical pharmacist in 3 weeks and Dr. Joya Gaskins in 6 weeks ? ? ?

## 2022-05-09 NOTE — Assessment & Plan Note (Signed)
This has resolved with surgery ?

## 2022-05-09 NOTE — Assessment & Plan Note (Signed)
A1c now greater than 10 after it being less than 7 secondary to not being able to access [place], side effects to [place] and Jardiance. ? ?I did call the prior authorization line of the patient's [employer] and the Wilder Glade is not included in his plan.  They are faxing me an expedited appeal for [place] to fill out the fact that he cannot take Ozempic and Jardiance. ? ?In the interim I am going to prescribe Humalog lispro insulin 8 units 3 times a day before each meal hold if blood sugar less than 150, patient will increase insulin glargine to 30 units a day. ? ?I gave the patient a lifestyle medicine handout and instructed him to follow-up plant-based diet.  Ask him to take his lunch to work. ? ?Patient is requested to get an eye exam ? ?We will bring this patient back short-term in 2 weeks to see clinical pharmacy and Dr. Joya Gaskins again in 6 weeks ? ?We will process the appeal form ASAP once received ?

## 2022-05-09 NOTE — Assessment & Plan Note (Signed)
Continue cholesterol therapy 

## 2022-05-09 NOTE — Telephone Encounter (Signed)
Patient Advocate Encounter ?  ?Received notification from [place] that prior authorization for Wilder Glade is required. (Patient's insurance prefers [place]. Patient tried and failed Jardiance due to rash). ?  ?PA submitted on CoverMyMeds ?Key [id number] ?Status is pending ?  ?Will continue to follow. ? ?

## 2022-05-09 NOTE — Assessment & Plan Note (Signed)
Resolved with surgical resection ?

## 2022-05-09 NOTE — Assessment & Plan Note (Signed)
Stable heart failure ?

## 2022-05-09 NOTE — Assessment & Plan Note (Signed)
Blood pressure stable at this time no change in medicine ?

## 2022-05-09 NOTE — Progress Notes (Signed)
? ?Established Patient Office Visit ? ?Subjective:  ?Patient ID: [name], male    DOB: May 01, 1971  Age: 51 y.o. MRN: [id number] ? ?CC:  ?No chief complaint on file. ? ? ?HPI ?10/2021 ?[name] presents for primary care follow-up visit for diabetes hypertension coronary disease and chronic systolic heart failure. ? ?Since the last visit the patient had to stop Jardiance because of a fungal rash in the groin.  He is maintaining glipizide Lantus at this time.  He is being considered for potential Trulicity. ? ?Patient also had an injury to the right hand while operating his truck.  He developed cellulitis over the dorsal aspect of that hand and we did a telephone visit last week and prescribed Bactrim for this and has resolved the cellulitis ? ?Patient has an upcoming appointment with the pharmacist at the heart vascular clinic to discuss further changes in his diabetic medications.  He is stable on all of his heart medicines on arrival blood pressure is 117/84 .  His hemoglobin A1c on arrival is 7.1 this is an improvement from prior values.  He has been having follow-up with podiatry and he has healed his left foot ulcer he does have a Charcot foot on the right and has some internal fractures which are being watched he does not have any new ulcerations. ? ?5/11 ?This patient is seen in return follow-up and unfortunately he could not tolerate Ozempic and had to stop this due to abdominal pain.  He previously could not tolerate Jardiance due to a rash.  We attempted to get him Wilder Glade and this has been denied by [employer]. ? ?In the interim the patient's maintain insulin Lantus at 20 units a day and his blood sugar is gone up to 300.  A1c now is greater than 10.  He is not following a healthy diet.  He has been followed closely by podiatry and his wounds of all healed from his prior surgeries.  He is due a colon cancer screening. ? ?Patient's states his breathing is stable without any cough or  shortness of breath.  There is no chest pain. ? ?Past Medical History:  ?Diagnosis Date  ? Acute systolic CHF (congestive heart failure) ([place]) 10/28/2016  ? CAD (coronary artery disease)   ? NSTEMI 10/2011: [place] 11/04/11: pLAD 90%, mLAD 60-70%, small D2 sub totally occluded at ostium, small OM1 90% ostial, 90% mid, mOM2 30%, oPL 80%, RCA 30%, dPDA 80%, EF 20% with ant AK.  PCI:  Promus DES to pLAD.  ? Cellulitis of left foot 06/22/2020  ? Chronic kidney disease   ? CKD  ? Chronic systolic heart failure ([place])   ? Diabetic ulcer of left midfoot associated with type 2 diabetes mellitus, limited to breakdown of skin ([place])   ? Diabetic ulcer of left midfoot associated with type 2 diabetes mellitus, with muscle involvement without evidence of necrosis ([place])   ? DM2 (diabetes mellitus, type 2) ([place])   ? type 2  ? GERD (gastroesophageal reflux disease)   ? HTN (hypertension)   ? Hyperlipidemia   ? Ischemic cardiomyopathy   ? Echo 11/03/11: mod LVH, mild focal basal septal hypertrophy, EF 15%, grade 2 diast dysfxn, mild MR, mild to mod LAE, mild RVE, mild to mod reduced RVSF.  EF 3/5 50% by echo  ? Myocardial infarction [facility])   ? Neuromuscular disorder ([place])   ? neuropathy feet  ? Obesity   ? 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  Ulcer of right foot ([place]) 06/22/2020  ? ? ?Past Surgical History:  ?Procedure Laterality Date  ? AMPUTATION Left 06/23/2020  ? Procedure: AMPUTATION RAY 5th;  Surgeon: Trula Slade, DPM;  Location: [place];  Service: Podiatry;  Laterality: Left;  ? BONE BIOPSY Left 12/20/2020  ? Procedure: BONE BIOPSY X 3;  Surgeon: Evelina Bucy, DPM;  [facility];  Service: Podiatry;  Laterality: Left;  ? CARDIAC CATHETERIZATION N/A 10/25/2016  ? Procedure: Right/Left Heart Cath and Coronary Angiography;  Surgeon: Leonie Man, MD;  Location: [place] [facility];  Service: Cardiovascular;  Laterality: N/A;  ? CARDIAC CATHETERIZATION N/A 10/25/2016  ? Procedure: Coronary Stent  Intervention;  Surgeon: Leonie Man, MD;  Location: [place] [facility];  Service: Cardiovascular;  Laterality: N/A;  ? CARDIAC CATHETERIZATION N/A 10/28/2016  ? Procedure: Coronary Stent Intervention;  Surgeon: Burnell Blanks, MD;  Location: [place] [facility];  Service: Cardiovascular;  Laterality: N/A;  ? GRAFT APPLICATION Left 08/30/5175  ? Procedure: APPLICATION OF SKIN GRAFT USING [employer]TE[employer] BILAYER MATRIX WOUND DRESSING;  Surgeon: Evelina Bucy, DPM;  Location: [place];  Service: Podiatry;  Laterality: Left;  ? I & D EXTREMITY Left 07/14/2020  ? Procedure: IRRIGATION AND DEBRIDEMENT LEFT FOOT;  Surgeon: Evelina Bucy, DPM;  Location: [place];  Service: Podiatry;  Laterality: Left;  ? LEFT AND RIGHT HEART CATHETERIZATION WITH CORONARY ANGIOGRAM N/A 11/04/2011  ? Procedure: LEFT AND RIGHT HEART CATHETERIZATION WITH CORONARY ANGIOGRAM;  Surgeon: Minus Breeding, MD;  Location[facility] C[facility] LAB;  Service: Cardiovascular;  Laterality: N/A;  ? METATARSAL HEAD EXCISION Left 01/24/2021  ? Procedure: 4th METATARSAL HEAD EXCISION, Proximal phalanx resection;  Surgeon: Evelina Bucy, DPM;  [facility];  Service: Podiatry;  Laterality: Left;  ? METATARSAL HEAD EXCISION Right 03/30/2021  ? Procedure: PROXIMAL 5TH TOE RESECTION ON RIGHT;  Surgeon: Evelina Bucy, DPM;  [facility];  Service: Podiatry;  Laterality: Right;  ? PERCUTANEOUS CORONARY STENT INTERVENTION (PCI-S)  11/04/2011  ? Procedure: PERCUTANEOUS CORONARY STENT INTERVENTION (PCI-S);  Surgeon: Sherren Mocha, MD;  Location: [facility] CATH LAB;  Service: Cardiovascular;;  ? WOUND DEBRIDEMENT Left 06/26/2020  ? Procedure: LEFT FOOT WOUND DEBRIDEMENT AND GRAFT APPLICATION;  Surgeon: Trula Slade, DPM;  Location: [place];  Service: Podiatry;  Laterality: Left;  ? WOUND DEBRIDEMENT Left 12/20/2020  ? Procedure: DEBRIDEMENT WOUND AND POSSIBLE CLOSURE;  Surgeon: Evelina Bucy, DPM;  [facility];  Service: Podiatry;  Laterality: Left;  ? WOUND  DEBRIDEMENT Left 01/24/2021  ? Procedure: DEBRIDEMENT WOUND;  Surgeon: Evelina Bucy, DPM;  [facility];  Service: Podiatry;  Laterality: Left;  ? WOUND DEBRIDEMENT Bilateral 03/30/2021  ? Procedure: DEBRIDEMENT WOUND WITH SKIN GRAFT SUBSTITUTE APPLICATION;  Surgeon: Evelina Bucy, DPM;  [facility];  Service: Podiatry;  Laterality: Bilateral;  ? WOUND DEBRIDEMENT Right 04/20/2021  ? Procedure: DEBRIDEMENT WOUND,RIGHT FOOT;  Surgeon: Evelina Bucy, DPM;  [facility];  Service: Podiatry;  Laterality: Right;  ? ? ?Family History  ?Problem Relation Age of Onset  ? Heart disease Father   ? Heart disease Mother   ?     MOTHER HAD CABG  ? ? ?Social History  ? ?Socioeconomic History  ? Marital status: Single  ?  Spouse name: Not on file  ? Number of children: Not on file  ? Years of education: Not on file  ? Highest education level: Not on file  ?Occupational History  ?  Not on file  ?Tobacco Use  ? Smoking status: Never  ? Smokeless tobacco: Never  ?Vaping Use  ? Vaping Use: Never used  ?Substance and Sexual Activity  ? Alcohol use: Not Currently  ?  Alcohol/week: 1.0 standard drink  ?  Types: 1 Shots of liquor per week  ? Drug use: No  ? Sexual activity: Yes  ?  Birth control/protection: None  ?Other Topics Concern  ? Not on file  ?Social History Narrative  ? Not on file  ? ?Social Determinants of Health  ? ?Financial Resource Strain: Not on file  ?Food Insecurity: Not on file  ?Transportation Needs: Not on file  ?Physical Activity: Not on file  ?Stress: Not on file  ?Social Connections: Not on file  ?Intimate Partner Violence: Not on file  ? ? ?Outpatient Medications Prior to Visit  ?Medication Sig Dispense Refill  ? acetaminophen (TYLENOL) 500 MG tablet Take 500 mg by mouth every 6 (six) hours as needed.    ? aspirin 81 MG chewable tablet Chew 1 tablet (81 mg total) by mouth daily. 30 tablet 6  ? atorvastatin (LIPITOR) 80 MG tablet TAKE 1 TABLET (80 MG TOTAL) BY MOUTH EVERY EVENING. 90 tablet 6  ?  Blood Glucose Monitoring Suppl (ONETOUCH VERIO) w/Device KIT Use to check blood sugar 3 times daily. 1 kit 0  ? carvedilol (COREG) 6.25 MG tablet TAKE 1 TABLET (6.25 MG TOTAL) BY MOUTH 2 (TWO) TIMES DAILY WITH A MEAL.

## 2022-05-10 ENCOUNTER — Other Ambulatory Visit: Payer: Self-pay

## 2022-05-14 ENCOUNTER — Other Ambulatory Visit (HOSPITAL_COMMUNITY): Payer: Self-pay

## 2022-05-14 ENCOUNTER — Other Ambulatory Visit: Payer: Self-pay

## 2022-06-03 ENCOUNTER — Ambulatory Visit: Payer: No Typology Code available for payment source | Admitting: Podiatry

## 2022-06-05 ENCOUNTER — Other Ambulatory Visit (HOSPITAL_COMMUNITY): Payer: Self-pay

## 2022-06-05 ENCOUNTER — Other Ambulatory Visit: Payer: Self-pay

## 2022-06-11 ENCOUNTER — Other Ambulatory Visit: Payer: Self-pay

## 2022-06-11 ENCOUNTER — Other Ambulatory Visit (HOSPITAL_COMMUNITY): Payer: Self-pay

## 2022-06-13 ENCOUNTER — Ambulatory Visit: Payer: No Typology Code available for payment source | Admitting: Pharmacist

## 2022-06-18 ENCOUNTER — Ambulatory Visit (INDEPENDENT_AMBULATORY_CARE_PROVIDER_SITE_OTHER): Payer: No Typology Code available for payment source | Admitting: Podiatrist

## 2022-06-18 ENCOUNTER — Encounter: Payer: Self-pay | Admitting: Podiatrist

## 2022-06-18 DIAGNOSIS — M14671 Charcot's joint, right ankle and foot: Secondary | ICD-10-CM

## 2022-07-04 ENCOUNTER — Other Ambulatory Visit: Payer: Self-pay | Admitting: Critical Care Medicine

## 2022-07-04 ENCOUNTER — Other Ambulatory Visit (HOSPITAL_COMMUNITY): Payer: Self-pay

## 2022-07-04 DIAGNOSIS — E1169 Type 2 diabetes mellitus with other specified complication: Secondary | ICD-10-CM

## 2022-07-04 MED ORDER — FUROSEMIDE 40 MG PO TABS
ORAL_TABLET | Freq: Every day | ORAL | 0 refills | Status: DC
  Filled 2022-07-04: qty 30, 30d supply, fill #0

## 2022-07-04 MED ORDER — SPIRONOLACTONE 50 MG PO TABS
50.0000 mg | ORAL_TABLET | Freq: Every day | ORAL | 0 refills | Status: DC
  Filled 2022-07-04: qty 30, 30d supply, fill #0

## 2022-07-05 ENCOUNTER — Other Ambulatory Visit (HOSPITAL_COMMUNITY): Payer: Self-pay

## 2022-07-05 ENCOUNTER — Other Ambulatory Visit: Payer: Self-pay | Admitting: Critical Care Medicine

## 2022-07-05 MED ORDER — SPIRONOLACTONE 50 MG PO TABS
50.0000 mg | ORAL_TABLET | Freq: Every day | ORAL | 0 refills | Status: DC
Start: 2022-07-05 — End: 2022-08-06
  Filled 2022-07-05: qty 90, 90d supply, fill #0

## 2022-07-05 MED ORDER — FUROSEMIDE 40 MG PO TABS
ORAL_TABLET | Freq: Every day | ORAL | 0 refills | Status: DC
  Filled 2022-07-05 – 2022-07-09 (×2): qty 90, 90d supply, fill #0

## 2022-07-05 MED ORDER — ONETOUCH VERIO W/DEVICE KIT
PACK | 0 refills | Status: AC
  Filled 2022-07-05: qty 1, 30d supply, fill #0

## 2022-07-06 ENCOUNTER — Other Ambulatory Visit (HOSPITAL_COMMUNITY): Payer: Self-pay

## 2022-07-08 ENCOUNTER — Other Ambulatory Visit (HOSPITAL_COMMUNITY): Payer: Self-pay

## 2022-07-09 ENCOUNTER — Other Ambulatory Visit (HOSPITAL_COMMUNITY): Payer: Self-pay

## 2022-07-09 ENCOUNTER — Other Ambulatory Visit: Payer: Self-pay

## 2022-07-10 ENCOUNTER — Other Ambulatory Visit (HOSPITAL_COMMUNITY): Payer: Self-pay

## 2022-07-10 ENCOUNTER — Encounter: Payer: Self-pay | Admitting: Podiatrist

## 2022-07-11 ENCOUNTER — Other Ambulatory Visit (HOSPITAL_COMMUNITY): Payer: Self-pay

## 2022-07-11 ENCOUNTER — Ambulatory Visit: Payer: No Typology Code available for payment source | Admitting: Critical Care Medicine

## 2022-07-12 NOTE — Telephone Encounter (Signed)
Please advise 

## 2022-07-16 ENCOUNTER — Ambulatory Visit: Payer: No Typology Code available for payment source | Admitting: Pharmacist

## 2022-07-16 ENCOUNTER — Other Ambulatory Visit (HOSPITAL_COMMUNITY): Payer: Self-pay

## 2022-07-16 ENCOUNTER — Ambulatory Visit: Payer: No Typology Code available for payment source

## 2022-07-16 ENCOUNTER — Ambulatory Visit (INDEPENDENT_AMBULATORY_CARE_PROVIDER_SITE_OTHER): Payer: No Typology Code available for payment source | Admitting: Podiatry

## 2022-07-16 DIAGNOSIS — E11621 Type 2 diabetes mellitus with foot ulcer: Secondary | ICD-10-CM

## 2022-07-16 DIAGNOSIS — L97422 Non-pressure chronic ulcer of left heel and midfoot with fat layer exposed: Secondary | ICD-10-CM | POA: Diagnosis not present

## 2022-07-16 MED ORDER — GLUCOSE BLOOD VI STRP
ORAL_STRIP | 0 refills | Status: AC
  Filled 2022-07-16: qty 100, 33d supply, fill #0

## 2022-07-16 MED ORDER — GENTAMICIN SULFATE 0.1 % EX OINT
1.0000 | TOPICAL_OINTMENT | Freq: Three times a day (TID) | CUTANEOUS | 0 refills | Status: DC
  Filled 2022-07-16: qty 30, 10d supply, fill #0

## 2022-07-20 ENCOUNTER — Encounter: Payer: Self-pay | Admitting: Podiatry

## 2022-07-20 NOTE — Progress Notes (Signed)
  Subjective:  Patient ID: [name], male    DOB: 12-07-1971,  MRN: [id number]  Chief Complaint  Patient presents with   Diabetic Ulcer    L ft where pinkie toe is amputated open wound    51 y.o. male presents with the above complaint. History confirmed with patient.  He is known previously to Dr. Samuella Cota he developed a ulceration about 2 weeks ago where his fifth toe was previously amputated  Objective:  Physical Exam: warm, good capillary refill, no trophic changes or ulcerative lesions, normal DP and PT pulses, and full-thickness ulceration left lateral foot measuring 1.2 x 0.4 cm 0.2 cm, granular wound bed no exposed bone tendon or joint no malodorous tissue      Assessment:   1. Diabetic ulcer of left midfoot associated with type 2 diabetes mellitus, with muscle involvement without evidence of necrosis [facility])      Plan:  Patient was evaluated and treated and all questions answered.  Ulcer left foot -We discussed the etiology and factors that are a part of the wound healing process.  We also discussed the risk of infection both soft tissue and osteomyelitis from open ulceration.  Discussed the risk of limb loss if this happens or worsens. -Debridement as below. -Dressed with Iodosorb, DSD. -Continue home dressing changes daily with bandaid-type dressing and gentamicin -Vascular testing not indicated -HgbA1c: His last A1c was 10.8% in May -Last antibiotics: No signs of localized infection did not prescribe him oral antibiotics currently   Procedure: Excisional Debridement of Wound Rationale: Removal of non-viable soft tissue from the wound to promote healing.  Anesthesia: none Post-Debridement Wound Measurements: 1.2 cm x 0.4 cm x 0.2 cm  Type of Debridement: Sharp Excisional Tissue Removed: Non-viable soft tissue Depth of Debridement: subcutaneous tissue. Technique: Sharp excisional debridement to bleeding, viable wound base.  Dressing: Dry, sterile, compression  dressing. Disposition: Patient tolerated procedure well.      Return in about 3 weeks (around 08/06/2022) for wound care.

## 2022-07-30 ENCOUNTER — Encounter: Payer: Self-pay | Admitting: Podiatry

## 2022-08-01 ENCOUNTER — Ambulatory Visit (INDEPENDENT_AMBULATORY_CARE_PROVIDER_SITE_OTHER): Payer: 59 | Admitting: Podiatry

## 2022-08-01 ENCOUNTER — Telehealth: Payer: Self-pay

## 2022-08-01 DIAGNOSIS — L97422 Non-pressure chronic ulcer of left heel and midfoot with fat layer exposed: Secondary | ICD-10-CM

## 2022-08-01 DIAGNOSIS — E11621 Type 2 diabetes mellitus with foot ulcer: Secondary | ICD-10-CM | POA: Diagnosis not present

## 2022-08-01 NOTE — Telephone Encounter (Signed)
Order and demographics faxed to [facility] for 3 ulcers left foot. Silicone bordered foam changed daily  4th met ulcer 1.0 x 1.0 x 0.2 cm 5th met ulcer 1.0 x 1.0 x 0.2 cm 5th met ulcer 1.0 x (base)1.0 x 0.2 cm

## 2022-08-04 NOTE — Progress Notes (Signed)
  Subjective:  Patient ID: [name], male    DOB: [birth year],  MRN: [id number]  Chief Complaint  Patient presents with   Diabetic Ulcer     left foot has 2 spots possible blood blisters on side of foot    51 y.o. male presents with the above complaint. History confirmed with patient.  Has seen some improvement.  He has been applying the ointment  Objective:  Physical Exam: warm, good capillary refill, no trophic changes or ulcerative lesions, normal DP and PT pulses, and full-thickness ulceration left lateral foot measuring 1.0 x 0.8 cm 0.2 cm, granular wound bed no exposed bone tendon or joint no malodorous tissue       Assessment:   1. Diabetic ulcer of left midfoot associated with type 2 diabetes mellitus, with fat layer exposed (HCC)      Plan:  Patient was evaluated and treated and all questions answered.  Ulcer left foot -We discussed the etiology and factors that are a part of the wound healing process.  We also discussed the risk of infection both soft tissue and osteomyelitis from open ulceration.  Discussed the risk of limb loss if this happens or worsens. -Debridement as below. -Dressed with Iodosorb, DSD. -Continue home dressing changes Glenn with bandaid-type dressing and gentamicin -Vascular testing not indicated -HgbA1c: His last A1c was 10.8% in May -Last antibiotics: No signs of localized infection did not prescribe him oral antibiotics currently   Procedure: Excisional Debridement of Wound Rationale: Removal of non-viable soft tissue from the wound to promote healing.  Anesthesia: none Post-Debridement Wound Measurements: 1.0 cm x 0.8 cm x 0.2 cm  Type of Debridement: Sharp Excisional Tissue Removed: Non-viable soft tissue Depth of Debridement: subcutaneous tissue. Technique: Sharp excisional debridement to bleeding, viable wound base.  Dressing: Dry, sterile, compression dressing. Disposition: Patient tolerated procedure well.      No  follow-ups on file.

## 2022-08-06 ENCOUNTER — Other Ambulatory Visit: Payer: Self-pay | Admitting: Critical Care Medicine

## 2022-08-06 ENCOUNTER — Other Ambulatory Visit (HOSPITAL_COMMUNITY): Payer: Self-pay

## 2022-08-06 ENCOUNTER — Other Ambulatory Visit (HOSPITAL_COMMUNITY): Payer: Self-pay | Admitting: Cardiology

## 2022-08-06 MED ORDER — FUROSEMIDE 40 MG PO TABS
ORAL_TABLET | Freq: Every day | ORAL | 0 refills | Status: DC
  Filled 2022-08-06: qty 90, fill #0

## 2022-08-06 MED ORDER — SPIRONOLACTONE 50 MG PO TABS
50.0000 mg | ORAL_TABLET | Freq: Every day | ORAL | 0 refills | Status: DC
  Filled 2022-08-06: qty 90, 90d supply, fill #0

## 2022-08-06 NOTE — Telephone Encounter (Signed)
Requested Prescriptions  Pending Prescriptions Disp Refills  . furosemide (LASIX) 40 MG tablet 90 tablet 0    Sig: TAKE 1 TABLET (40 MG TOTAL) BY MOUTH DAILY.     Cardiovascular:  Diuretics - Loop Failed - 08/06/2022 12:15 PM      Failed - K in normal range and within 180 days    Potassium  Date Value Ref Range Status  09/14/2021 4.2 3.5 - 5.1 mmol/L Final         Failed - Ca in normal range and within 180 days    Calcium  Date Value Ref Range Status  09/14/2021 9.3 8.9 - 10.3 mg/dL Final   Calcium, Ion  Date Value Ref Range Status  03/02/2011 1.08 (L) 1.12 - 1.32 mmol/L Final         Failed - Na in normal range and within 180 days    Sodium  Date Value Ref Range Status  09/14/2021 138 135 - 145 mmol/L Final  01/25/2020 144 134 - 144 mmol/L Final         Failed - Cr in normal range and within 180 days    Creat  Date Value Ref Range Status  07/11/2016 1.38 (H) 0.60 - 1.35 mg/dL Final   Creatinine, Ser  Date Value Ref Range Status  09/14/2021 1.16 0.61 - 1.24 mg/dL Final         Failed - Cl in normal range and within 180 days    Chloride  Date Value Ref Range Status  09/14/2021 105 98 - 111 mmol/L Final         Failed - Mg Level in normal range and within 180 days    Magnesium  Date Value Ref Range Status  06/23/2020 2.1 1.7 - 2.4 mg/dL Final    Comment:    Performed at [facility], [place] [address], [place], [place] [address]         Passed - Last BP in normal range    BP Readings from Last 1 Encounters:  05/09/22 109/73         Passed - Valid encounter within last 6 months    Recent Outpatient Visits          2 months ago Diabetes mellitus type 2 in obese [facility])   [place] Elsie Stain, MD   4 months ago Upper respiratory tract infection, unspecified type   [place], [place] W, NP   9 months ago Controlled type 2 diabetes mellitus with stage 3 chronic kidney disease, with  long-term current use of insulin ([place])   [place], Patrick E, MD   1 year ago Controlled type 2 diabetes mellitus with stage 3 chronic kidney disease, with long-term current use of insulin ([place])   [place], Patrick E, MD   1 year ago Diabetes mellitus type 2 in obese [facility])   [place] Elsie Stain, MD      Future Appointments            In 3 weeks Tresa Endo, [place]   In 3 weeks Joya Gaskins Burnett Harry, MD [place]           . spironolactone (ALDACTONE) 50 MG tablet 90 tablet 0    Sig: Take 1 tablet (50 mg total) by mouth  daily.     Cardiovascular: Diuretics - Aldosterone Antagonist Failed - 08/06/2022 12:15 PM      Failed - Cr in normal range and within 180 days    Creat  Date Value Ref Range Status  07/11/2016 1.38 (H) 0.60 - 1.35 mg/dL Final   Creatinine, Ser  Date Value Ref Range Status  09/14/2021 1.16 0.61 - 1.24 mg/dL Final         Failed - K in normal range and within 180 days    Potassium  Date Value Ref Range Status  09/14/2021 4.2 3.5 - 5.1 mmol/L Final         Failed - Na in normal range and within 180 days    Sodium  Date Value Ref Range Status  09/14/2021 138 135 - 145 mmol/L Final  01/25/2020 144 134 - 144 mmol/L Final         Failed - eGFR is 30 or above and within 180 days    GFR calc Af Amer  Date Value Ref Range Status  07/14/2020 >60 >60 mL/min Final   GFR, Estimated  Date Value Ref Range Status  09/14/2021 >60 >60 mL/min Final    Comment:    (NOTE) Calculated using the CKD-EPI Creatinine Equation (2021)    GFR  Date Value Ref Range Status  12/02/2011 71.35 >60.00 mL/min Final         Passed - Last BP in normal range    BP Readings from Last 1 Encounters:  05/09/22 109/73         Passed - Valid encounter within last 6 months    Recent  Outpatient Visits          2 months ago Diabetes mellitus type 2 in obese [facility])   [place] Elsie Stain, MD   4 months ago Upper respiratory tract infection, unspecified type   [place], Vernia Buff, NP   9 months ago Controlled type 2 diabetes mellitus with stage 3 chronic kidney disease, with long-term current use of insulin ([place])   [place] Elsie Stain, MD   1 year ago Controlled type 2 diabetes mellitus with stage 3 chronic kidney disease, with long-term current use of insulin ([place])   [place], Patrick E, MD   1 year ago Diabetes mellitus type 2 in obese [facility])   [place] Elsie Stain, MD      Future Appointments            In 3 weeks Daisy Blossom, Jar[place] Matin, [place]   In 3 weeks Joya Gaskins Burnett Harry, MD [place]

## 2022-08-06 NOTE — Telephone Encounter (Signed)
Unfortunately the patient's insurance will not approve him to fill [place], even on appeal. He is over the income for assistance. He has bad side effects on Jardiance. He is aware and there is nothing we can do. Thanks

## 2022-08-06 NOTE — Telephone Encounter (Signed)
Copied from CRM [id number]. Topic: General - Other >> Aug 06, 2022  9:07 AM Macon Large wrote: Reason for CRM: Pt requests that Dr. Lynelle Doctor nurse or assistant return his call regarding the insurance covering medication. Cb# [phone]

## 2022-08-08 ENCOUNTER — Other Ambulatory Visit (HOSPITAL_COMMUNITY): Payer: Self-pay

## 2022-08-12 ENCOUNTER — Other Ambulatory Visit (HOSPITAL_COMMUNITY): Payer: Self-pay

## 2022-08-14 ENCOUNTER — Other Ambulatory Visit: Payer: Self-pay

## 2022-08-14 ENCOUNTER — Ambulatory Visit
Admission: EM | Admit: 2022-08-14 | Discharge: 2022-08-14 | Disposition: A | Discharge status: Home / Self Care | Payer: Self-pay | Attending: Internal Medicine | Admitting: Internal Medicine

## 2022-08-14 ENCOUNTER — Ambulatory Visit: Payer: Self-pay

## 2022-08-14 ENCOUNTER — Other Ambulatory Visit (HOSPITAL_COMMUNITY): Payer: Self-pay

## 2022-08-14 ENCOUNTER — Ambulatory Visit (INDEPENDENT_AMBULATORY_CARE_PROVIDER_SITE_OTHER): Payer: Self-pay

## 2022-08-14 DIAGNOSIS — M25512 Pain in left shoulder: Secondary | ICD-10-CM

## 2022-08-14 DIAGNOSIS — M542 Cervicalgia: Secondary | ICD-10-CM

## 2022-08-14 DIAGNOSIS — M545 Low back pain, unspecified: Secondary | ICD-10-CM

## 2022-08-14 MED ORDER — METHOCARBAMOL 500 MG PO TABS
500.0000 mg | ORAL_TABLET | Freq: Two times a day (BID) | ORAL | 0 refills | Status: DC | PRN
  Filled 2022-08-14 (×2): qty 20, 10d supply, fill #0
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
## 2022-08-14 NOTE — Discharge Instructions (Addendum)
Your x-rays were all normal.  Suspect muscle strain versus bruising causing your discomfort.  You have been prescribed a muscle relaxer to take as needed.  Please be advised that it can cause drowsiness so do not drive, operate machinery, drink alcohol with taking this medication.  You may follow-up with orthopedist at provided contact information if symptoms persist or worsen.  You also have atherosclerosis of your carotid arteries so radiology recommended further evaluation and management with sonography so please follow-up with your primary care doctor and/or cardiologist for this.

## 2022-08-15 ENCOUNTER — Other Ambulatory Visit (HOSPITAL_COMMUNITY): Payer: Self-pay

## 2022-08-15 ENCOUNTER — Other Ambulatory Visit: Payer: Self-pay

## 2022-08-15 ENCOUNTER — Other Ambulatory Visit: Payer: Self-pay | Admitting: Pharmacist

## 2022-08-15 DIAGNOSIS — G4733 Obstructive sleep apnea (adult) (pediatric): Secondary | ICD-10-CM | POA: Diagnosis not present

## 2022-08-15 MED ORDER — BASAGLAR KWIKPEN 100 UNIT/ML ~~LOC~~ SOPN
30.0000 [IU] | PEN_INJECTOR | Freq: Every day | SUBCUTANEOUS | 2 refills | Status: DC
  Filled 2022-08-15: qty 9, 30d supply, fill #0

## 2022-08-19 ENCOUNTER — Other Ambulatory Visit: Payer: Self-pay

## 2022-08-19 ENCOUNTER — Other Ambulatory Visit (HOSPITAL_COMMUNITY): Payer: Self-pay

## 2022-08-20 ENCOUNTER — Ambulatory Visit: Payer: Self-pay

## 2022-08-20 ENCOUNTER — Ambulatory Visit (INDEPENDENT_AMBULATORY_CARE_PROVIDER_SITE_OTHER): Payer: Self-pay | Admitting: Sports Medicine

## 2022-08-20 ENCOUNTER — Encounter: Payer: Self-pay | Admitting: Sports Medicine

## 2022-08-20 ENCOUNTER — Ambulatory Visit (INDEPENDENT_AMBULATORY_CARE_PROVIDER_SITE_OTHER): Payer: 59 | Admitting: Podiatry

## 2022-08-20 ENCOUNTER — Other Ambulatory Visit (HOSPITAL_COMMUNITY): Payer: Self-pay

## 2022-08-20 ENCOUNTER — Telehealth (HOSPITAL_COMMUNITY): Payer: Self-pay | Admitting: Pharmacy Technician

## 2022-08-20 DIAGNOSIS — S46812A Strain of other muscles, fascia and tendons at shoulder and upper arm level, left arm, initial encounter: Secondary | ICD-10-CM

## 2022-08-20 DIAGNOSIS — L97422 Non-pressure chronic ulcer of left heel and midfoot with fat layer exposed: Secondary | ICD-10-CM

## 2022-08-20 DIAGNOSIS — E11621 Type 2 diabetes mellitus with foot ulcer: Secondary | ICD-10-CM

## 2022-08-20 DIAGNOSIS — M542 Cervicalgia: Secondary | ICD-10-CM

## 2022-08-20 DIAGNOSIS — M25552 Pain in left hip: Secondary | ICD-10-CM
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
## 2022-08-20 NOTE — Patient Instructions (Signed)
Ray - Great to meet you.  - Heat, IcyHot, massage - Perform hip exercises once daily - Give it 2-3 weeks to see if pain calming down, if not come back and we can discuss next options   Dr. Madelyn Brunner, DO
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
## 2022-08-20 NOTE — Telephone Encounter (Signed)
Advanced Heart Failure Patient Advocate Encounter  Prior Authorization for [name] has been submitted and approved.    PA# [id number] Effective dates: 08/20/22 through 08/21/23  Patients co-pay is $529.84 for 30 days (patient has $7500 deductible). Called and spoke with the patient. He cannot afford the co-pay even with the co-pay card. Unfortunately, he is over the income for assistance. He will not be able to take an SGLT2i due to combination of cost/side effects of Jardiance.  Archer Asa, CPhT

## 2022-08-21 ENCOUNTER — Encounter: Payer: Self-pay | Admitting: Podiatry

## 2022-08-21 ENCOUNTER — Other Ambulatory Visit: Payer: Self-pay

## 2022-08-21 NOTE — Progress Notes (Signed)
  Subjective:  Patient ID: [name], male    DOB: June 19, 1971,  MRN: [id number]  Chief Complaint  Patient presents with   Diabetic Ulcer    3 week follow up, left midfoot    51 y.o. male presents with the above complaint. History confirmed with patient.  Has seen some improvement.  He has been applying the ointment  Objective:  Physical Exam: warm, good capillary refill, no trophic changes or ulcerative lesions, normal DP and PT pulses, and full-thickness ulceration left lateral foot measuring 0.9x0.5 cm 0.2 cm, granular wound bed no exposed bone tendon or joint no malodorous tissue         Assessment:   1. Diabetic ulcer of left midfoot associated with type 2 diabetes mellitus, with fat layer exposed (HCC)      Plan:  Patient was evaluated and treated and all questions answered.  Ulcer left foot -We discussed the etiology and factors that are a part of the wound healing process.  We also discussed the risk of infection both soft tissue and osteomyelitis from open ulceration.  Discussed the risk of limb loss if this happens or worsens. -Debridement as below. -Dressed with Iodosorb, DSD. -Continue home dressing changes daily with bandaid-type dressing and gentamicin -Vascular testing not indicated -HgbA1c: His last A1c was 10.8% in May -Last antibiotics: No signs of localized infection did not prescribe him oral antibiotics currently   Procedure: Excisional Debridement of Wound Rationale: Removal of non-viable soft tissue from the wound to promote healing.  Anesthesia: none Post-Debridement Wound Measurements: 0.9 cm X 0.5 cm x 0.2 cm  Type of Debridement: Sharp Excisional Tissue Removed: Non-viable soft tissue Depth of Debridement: subcutaneous tissue. Technique: Sharp excisional debridement to bleeding, viable wound base.  Dressing: Dry, sterile, compression dressing. Disposition: Patient tolerated procedure well.      Return in about 3 weeks (around  09/10/2022) for wound care.

## 2022-08-26 NOTE — Progress Notes (Unsigned)
Established Patient Office Visit  Subjective:  Patient ID: [name], male    DOB: 08-18-1971  Age: 51 y.o. MRN: [id number]  CC:  No chief complaint on file.   HPI 10/2021 [name] presents for primary care follow-up visit for diabetes hypertension coronary disease and chronic systolic heart failure.  Since the last visit the patient had to stop Jardiance because of a fungal rash in the groin.  He is maintaining glipizide Lantus at this time.  He is being considered for potential Trulicity.  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  He developed cellulitis over the dorsal aspect of that hand and we did a telephone visit last week and prescribed Bactrim for this and has resolved the cellulitis  Patient has an upcoming appointment with the pharmacist at the heart vascular clinic to discuss further changes in his diabetic medications.  He is stable on all of his heart medicines on arrival blood pressure is 117/84 .  His hemoglobin A1c on arrival is 7.1 this is an improvement from prior values.  He has been having follow-up with podiatry and he has healed his left foot ulcer he does have a Charcot foot on the right and has some internal fractures which are being watched he does not have any new ulcerations.  5/11 This patient is seen in return follow-up and unfortunately he could not tolerate Ozempic and had to stop this due to abdominal pain.  He previously could not tolerate Jardiance due to a rash.  We attempted to get him [name] and this has been denied by [employer].  In the interim the patient's maintain insulin Lantus at 20 units a day and his blood sugar is gone up to 300.  A1c now is greater than 10.  He is not following a healthy diet.  He has been followed closely by podiatry and his wounds of all healed from his prior surgeries.  He is due a colon cancer screening.  Patient's states his breathing is stable without any cough or  shortness of breath.  There is no chest pain.  Past Medical History:  Diagnosis Date   Acute systolic CHF (congestive heart failure) (HCC) 10/28/2016   CAD (coronary artery disease)    NSTEMI 10/2011: LHC 11/04/11: pLAD 90%, mLAD 60-70%, small D2 sub totally occluded at ostium, small OM1 90% ostial, 90% mid, mOM2 30%, oPL 80%, RCA 30%, dPDA 80%, EF 20% with ant AK.  PCI:  Promus DES to pLAD.   Cellulitis of left foot 06/22/2020   Chronic kidney disease    CKD   Chronic systolic heart failure (HCC)    Diabetic ulcer of left midfoot associated with type 2 diabetes mellitus, limited to breakdown of skin (HCC)    Diabetic ulcer of left midfoot associated with type 2 diabetes mellitus, with muscle involvement without evidence of necrosis (HCC)    DM2 (diabetes mellitus, type 2) (HCC)    type 2   GERD (gastroesophageal reflux disease)    HTN (hypertension)    Hyperlipidemia    Ischemic cardiomyopathy    Echo 11/03/11: mod LVH, mild focal basal septal hypertrophy, EF 15%, grade 2 diast dysfxn, mild MR, mild to mod LAE, mild RVE, mild to mod reduced RVSF.  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 (HCC) 06/22/2020  Ulcer of right foot ([place]) 06/22/2020    Past Surgical History:  Procedure Laterality Date   AMPUTATION Left 06/23/2020   Procedure: AMPUTATION RAY 5th;  Surgeon: Trula Slade, DPM;  Location: [place];  Service: Podiatry;  Laterality: Left;   BONE BIOPSY Left 12/20/2020   Procedure: BONE BIOPSY X 3;  Surgeon: Evelina Bucy, DPM;  [facility];  Service: Podiatry;  Laterality: Left;   CARDIAC CATHETERIZATION N/A 10/25/2016   Procedure: Right/Left Heart Cath and Coronary Angiography;  Surgeon: Leonie Man, MD;  Location: [place] [facility];  Service: Cardiovascular;  Laterality: N/A;   CARDIAC CATHETERIZATION N/A 10/25/2016   Procedure: Coronary Stent  Intervention;  Surgeon: Leonie Man, MD;  Location: [place] [facility];  Service: Cardiovascular;  Laterality: N/A;   CARDIAC CATHETERIZATION N/A 10/28/2016   Procedure: Coronary Stent Intervention;  Surgeon: Burnell Blanks, MD;  Location: [place] [facility];  Service: Cardiovascular;  Laterality: N/A;   Feet surgery Bilateral    GRAFT APPLICATION Left 32/20/2542   Procedure: APPLICATION OF SKIN GRAFT USING [employer] BILAYER MATRIX WOUND DRESSING;  Surgeon: Evelina Bucy, DPM;  Location: [place];  Service: Podiatry;  Laterality: Left;   I & D EXTREMITY Left 07/14/2020   Procedure: IRRIGATION AND DEBRIDEMENT LEFT FOOT;  Surgeon: Evelina Bucy, DPM;  Location: [place];  Service: Podiatry;  Laterality: Left;   LEFT AND RIGHT HEART CATHETERIZATION WITH CORONARY ANGIOGRAM N/A 11/04/2011   Procedure: LEFT AND RIGHT HEART CATHETERIZATION WITH CORONARY ANGIOGRAM;  Surgeon: Minus Breeding, MD;  Location: [facility];  Service: Cardiovascular;  Laterality: N/A;   METATARSAL HEAD EXCISION Left 01/24/2021   Procedure: 4th METATARSAL HEAD EXCISION, Proximal phalanx resection;  Surgeon: Evelina Bucy, DPM;  [facility];  Service: Podiatry;  Laterality: Left;   METATARSAL HEAD EXCISION Right 03/30/2021   Procedure: PROXIMAL 5TH TOE RESECTION ON RIGHT;  Surgeon: Evelina Bucy, DPM;  [facility];  Service: Podiatry;  Laterality: Right;   PERCUTANEOUS CORONARY STENT INTERVENTION (PCI-S)  11/04/2011   Procedure: PERCUTANEOUS CORONARY STENT INTERVENTION (PCI-S);  Surgeon: Sherren Mocha, MD;  Location: [facility] CATH LAB;  Service: Cardiovascular;;   WOUND DEBRIDEMENT Left 06/26/2020   Procedure: LEFT FOOT WOUND DEBRIDEMENT AND GRAFT APPLICATION;  Surgeon: Trula Slade, DPM;  Location: [place];  Service: Podiatry;  Laterality: Left;   WOUND DEBRIDEMENT Left 12/20/2020   Procedure: DEBRIDEMENT WOUND AND POSSIBLE CLOSURE;  Surgeon: Evelina Bucy, DPM;  [facility];  Service: Podiatry;   Laterality: Left;   WOUND DEBRIDEMENT Left 01/24/2021   Procedure: DEBRIDEMENT WOUND;  Surgeon: Evelina Bucy, DPM;  [facility];  Service: Podiatry;  Laterality: Left;   WOUND DEBRIDEMENT Bilateral 03/30/2021   Procedure: DEBRIDEMENT WOUND WITH SKIN GRAFT SUBSTITUTE APPLICATION;  Surgeon: Evelina Bucy, DPM;  [facility];  Service: Podiatry;  Laterality: Bilateral;   WOUND DEBRIDEMENT Right 04/20/2021   Procedure: DEBRIDEMENT Gaylan Gerold FOOT;  Surgeon: Evelina Bucy, DPM;  [facility];  Service: Podiatry;  Laterality: Right;    Family History  Problem Relation Age of Onset   Heart disease Father    Heart disease Mother        MOTHER HAD CABG    Social History   Socioeconomic History   Marital status: Single    Spouse name: Not on file   Number of children: Not on file   Years of education: Not on file   Highest education level: Not on  file  Occupational History   Not on file  Tobacco Use   Smoking status: Never   Smokeless tobacco: Never  Vaping Use   Vaping Use: Never used  Substance and Sexual Activity   Alcohol use: Not Currently    Alcohol/week: 1.0 standard drink of alcohol    Types: 1 Shots of liquor per week   Drug use: No   Sexual activity: Yes    Birth control/protection: None  Other Topics Concern   Not on file  Social History Narrative   Not on file   Social Determinants of Health   Financial Resource Strain: Low Risk  (05/26/2020)   Overall Financial Resource Strain (CARDIA)    Difficulty of Paying Living Expenses: Not very hard  Food Insecurity: No Food Insecurity (05/26/2020)   Hunger Vital Sign    Worried About Running Out of Food in the Last Year: Never true    Ran Out of Food in the Last Year: Never true  Transportation Needs: No Transportation Needs (05/26/2020)   PRAPARE - Hydrologist (Medical): No    Lack of Transportation (Non-Medical): No  Physical Activity: Not on file  Stress: Not on  file  Social Connections: Not on file  Intimate Partner Violence: Not on file    Outpatient Medications Prior to Visit  Medication Sig Dispense Refill   acetaminophen (TYLENOL) 500 MG tablet Take 500 mg by mouth every 6 (six) hours as needed.     aspirin 81 MG chewable tablet Chew 1 tablet (81 mg total) by mouth daily. 30 tablet 6   atorvastatin (LIPITOR) 80 MG tablet TAKE 1 TABLET (80 MG TOTAL) BY MOUTH EVERY EVENING. 90 tablet 6   Blood Glucose Monitoring Suppl (ONETOUCH VERIO) w/Device KIT Use to check blood sugar 3 times daily. 1 kit 0   carvedilol (COREG) 6.25 MG tablet TAKE 1 TABLET (6.25 MG TOTAL) BY MOUTH 2 (TWO) TIMES DAILY WITH A MEAL. 120 tablet 6   dapagliflozin propanediol (FARXIGA) 10 MG TABS tablet Take 1 tablet (10 mg total) by mouth daily. 30 tablet 11   Evolocumab (REPATHA SURECLICK) 161 MG/ML SOAJ INJECT 1 PEN INTO THE SKIN EVERY 14 (FOURTEEN) DAYS. 2 mL 11   furosemide (LASIX) 40 MG tablet TAKE 1 TABLET (40 MG TOTAL) BY MOUTH DAILY. 90 tablet 0   gentamicin ointment (GARAMYCIN) 0.1 % Apply 1 Application topically 3 (three) times daily. 30 g 0   glucose blood test strip Use to test blood sugar 3 times a day 100 each 0   Insulin Glargine (BASAGLAR KWIKPEN) 100 UNIT/ML Inject 30 Units into the skin daily. 9 mL 2   insulin lispro (HUMALOG KWIKPEN) 100 UNIT/ML KwikPen Inject 8 Units into the skin 3 (three) times daily before meals. Hold if blood sugar is less than 150 15 mL 11   Insulin Pen Needle 31G X 5 MM MISC USE TO INJECT INSULIN AT BEDTIME. 100 each 6   Lancets (ONETOUCH DELICA PLUS [id number]) MISC USE TO CHECK BLOOD SUGAR 3 TIMES DAILY. 100 each 2   meloxicam (MOBIC) 15 MG tablet Take 1 tablet (15 mg total) by mouth daily. 30 tablet 0   methocarbamol (ROBAXIN) 500 MG tablet Take 1 tablet (500 mg total) by mouth 2 (two) times daily as needed for muscle spasms. 20 tablet 0   omeprazole (PRILOSEC) 20 MG capsule Take 1 capsule (20 mg total) by mouth daily. 60 capsule 4    promethazine-dextromethorphan (PROMETHAZINE-DM) 6.25-15 MG/5ML syrup Take 5 mLs  by mouth 4 (four) times daily as needed for cough. 118 mL 0   sacubitril-valsartan (ENTRESTO) 97-103 MG Take 1 tablet by mouth 2 (two) times daily. 180 tablet 3   spironolactone (ALDACTONE) 50 MG tablet Take 1 tablet (50 mg total) by mouth daily. 90 tablet 0   Facility-Administered Medications Prior to Visit  Medication Dose Route Frequency Provider Last Rate Last Admin   vancomycin (VANCOCIN) powder    PRN Evelina Bucy, DPM   1,000 mg at 01/24/21 1411    Allergies  Allergen Reactions   Ozempic (0.25 Or 0.5 Mg-Dose) [Semaglutide(0.25 Or 0.5mg -Dos)] Other (See Comments)    Severe abdominal pain   Jardiance [Empagliflozin] Rash    ROS Review of Systems  Constitutional: Negative.   HENT: Negative.  Negative for ear pain, postnasal drip, rhinorrhea, sinus pressure, sore throat, trouble swallowing and voice change.   Eyes: Negative.   Respiratory: Negative.  Negative for apnea, cough, choking, chest tightness, shortness of breath, wheezing and stridor.   Cardiovascular: Negative.  Negative for chest pain, palpitations and leg swelling.  Gastrointestinal: Negative.  Negative for abdominal distention, abdominal pain, nausea and vomiting.  Genitourinary: Negative.   Musculoskeletal: Negative.  Negative for arthralgias and myalgias.  Skin: Negative.  Negative for rash and wound.  Allergic/Immunologic: Negative.  Negative for environmental allergies and food allergies.  Neurological: Negative.  Negative for dizziness, syncope, weakness and headaches.  Hematological: Negative.  Negative for adenopathy. Does not bruise/bleed easily.  Psychiatric/Behavioral: Negative.  Negative for agitation and sleep disturbance. The patient is not nervous/anxious.       Objective:    Physical Exam Vitals reviewed.  Constitutional:      Appearance: Normal appearance. He is well-developed. He is obese. He is not diaphoretic.   HENT:     Head: Normocephalic and atraumatic.     Nose: No nasal deformity, septal deviation, mucosal edema or rhinorrhea.     Right Sinus: No maxillary sinus tenderness or frontal sinus tenderness.     Left Sinus: No maxillary sinus tenderness or frontal sinus tenderness.     Mouth/Throat:     Pharynx: No oropharyngeal exudate.     Comments: Significant gingivitis Eyes:     General: No scleral icterus.    Conjunctiva/sclera: Conjunctivae normal.     Pupils: Pupils are equal, round, and reactive to light.  Neck:     Thyroid: No thyromegaly.     Vascular: No carotid bruit or JVD.     Trachea: Trachea normal. No tracheal tenderness or tracheal deviation.  Cardiovascular:     Rate and Rhythm: Normal rate and regular rhythm.     Chest Wall: PMI is not displaced.     Pulses: Normal pulses. No decreased pulses.     Heart sounds: Normal heart sounds, S1 normal and S2 normal. Heart sounds not distant. No murmur heard.    No systolic murmur is present.     No diastolic murmur is present.     No friction rub. No gallop. No S3 or S4 sounds.  Pulmonary:     Effort: No tachypnea, accessory muscle usage or respiratory distress.     Breath sounds: No stridor. No decreased breath sounds, wheezing, rhonchi or rales.  Chest:     Chest wall: No tenderness.  Abdominal:     General: Bowel sounds are normal. There is no distension.     Palpations: Abdomen is soft. Abdomen is not rigid.     Tenderness: There is no abdominal tenderness. There is  no guarding or rebound.  Musculoskeletal:        General: Normal range of motion.     Cervical back: Normal range of motion and neck supple. No edema, erythema or rigidity. No muscular tenderness. Normal range of motion.  Lymphadenopathy:     Head:     Right side of head: No submental or submandibular adenopathy.     Left side of head: No submental or submandibular adenopathy.     Cervical: No cervical adenopathy.  Skin:    General: Skin is warm and dry.      Coloration: Skin is not pale.     Findings: No rash.     Nails: There is no clubbing.  Neurological:     Mental Status: He is alert and oriented to person, place, and time.     Sensory: No sensory deficit.  Psychiatric:        Speech: Speech normal.        Behavior: Behavior normal.     There were no vitals taken for this visit. Wt Readings from Last 3 Encounters:  08/14/22 276 lb (125.2 kg)  05/09/22 279 lb 3.2 oz (126.6 kg)  11/12/21 268 lb 12.8 oz (121.9 kg)     Health Maintenance Due  Topic Date Due   OPHTHALMOLOGY EXAM  Never done   COLON CANCER SCREENING ANNUAL FOBT  Never done   INFLUENZA VACCINE  07/30/2022    There are no preventive care reminders to display for this patient.  Lab Results  Component Value Date   TSH 2.38 06/14/2013   Lab Results  Component Value Date   WBC 11.1 (H) 04/20/2021   HGB 12.1 (L) 04/20/2021   HCT 37.6 (L) 04/20/2021   MCV 80.5 04/20/2021   PLT 293 04/20/2021   Lab Results  Component Value Date   NA 138 09/14/2021   K 4.2 09/14/2021   CO2 25 09/14/2021   GLUCOSE 146 (H) 09/14/2021   BUN 24 (H) 09/14/2021   CREATININE 1.16 09/14/2021   BILITOT 0.4 12/08/2020   ALKPHOS 64 12/08/2020   AST 16 12/08/2020   ALT 20 12/08/2020   PROT 7.0 12/08/2020   ALBUMIN 4.2 12/08/2020   CALCIUM 9.3 09/14/2021   ANIONGAP 8 09/14/2021   GFR 71.35 12/02/2011   Lab Results  Component Value Date   CHOL 102 09/14/2021   Lab Results  Component Value Date   HDL 38 (L) 09/14/2021   Lab Results  Component Value Date   LDLCALC 45 09/14/2021   Lab Results  Component Value Date   TRIG 93 09/14/2021   Lab Results  Component Value Date   CHOLHDL 2.7 09/14/2021   Lab Results  Component Value Date   HGBA1C 10.8 (A) 05/09/2022      Assessment & Plan:   Problem List Items Addressed This Visit   None No orders of the defined types were placed in this encounter. 40 minutes spent history and physical obtaining educating patient  on lifestyle management of his conditions talking to insurance company to obtain prior authorization with a direct per peer to peer attempt at getting Wilder Glade approved  We will issue fecal occult kit for colon cancer screening Follow-up: No follow-ups on file.    Asencion Noble, MD

## 2022-08-27 ENCOUNTER — Other Ambulatory Visit: Payer: Self-pay | Admitting: Pharmacist

## 2022-08-27 ENCOUNTER — Other Ambulatory Visit: Payer: Self-pay

## 2022-08-27 ENCOUNTER — Ambulatory Visit: Payer: 59 | Admitting: Pharmacist

## 2022-08-27 ENCOUNTER — Ambulatory Visit: Payer: 59 | Attending: Critical Care Medicine | Admitting: Critical Care Medicine

## 2022-08-27 ENCOUNTER — Encounter: Payer: Self-pay | Admitting: Critical Care Medicine

## 2022-08-27 ENCOUNTER — Ambulatory Visit: Payer: No Typology Code available for payment source | Admitting: Podiatry

## 2022-08-27 VITALS — BP 123/88 | HR 89 | Ht 78.0 in | Wt 264.4 lb

## 2022-08-27 DIAGNOSIS — N183 Chronic kidney disease, stage 3 unspecified: Secondary | ICD-10-CM | POA: Diagnosis not present

## 2022-08-27 DIAGNOSIS — I10 Essential (primary) hypertension: Secondary | ICD-10-CM

## 2022-08-27 DIAGNOSIS — M86672 Other chronic osteomyelitis, left ankle and foot: Secondary | ICD-10-CM

## 2022-08-27 DIAGNOSIS — E669 Obesity, unspecified: Secondary | ICD-10-CM

## 2022-08-27 DIAGNOSIS — I509 Heart failure, unspecified: Secondary | ICD-10-CM

## 2022-08-27 DIAGNOSIS — E782 Mixed hyperlipidemia: Secondary | ICD-10-CM

## 2022-08-27 DIAGNOSIS — I5042 Chronic combined systolic (congestive) and diastolic (congestive) heart failure: Secondary | ICD-10-CM | POA: Diagnosis not present

## 2022-08-27 DIAGNOSIS — E1169 Type 2 diabetes mellitus with other specified complication: Secondary | ICD-10-CM

## 2022-08-27 DIAGNOSIS — E1165 Type 2 diabetes mellitus with hyperglycemia: Secondary | ICD-10-CM

## 2022-08-27 DIAGNOSIS — I255 Ischemic cardiomyopathy: Secondary | ICD-10-CM

## 2022-08-27 LAB — GLUCOSE, POCT (MANUAL RESULT ENTRY): POC Glucose: 350 mg/dl — AB (ref 70–99)

## 2022-08-27 LAB — POCT GLYCOSYLATED HEMOGLOBIN (HGB A1C): HbA1c, POC (controlled diabetic range): 13.9 % — AB (ref 0.0–7.0)

## 2022-08-27 MED ORDER — SPIRONOLACTONE 50 MG PO TABS
50.0000 mg | ORAL_TABLET | Freq: Every day | ORAL | 0 refills | Status: DC
  Filled 2022-08-27 – 2022-09-09 (×2): qty 90, 90d supply, fill #0
  Filled 2022-10-15: qty 30, 30d supply, fill #0
  Filled 2022-11-18: qty 30, 30d supply, fill #1

## 2022-08-27 MED ORDER — CARVEDILOL 6.25 MG PO TABS
ORAL_TABLET | Freq: Two times a day (BID) | ORAL | 6 refills | Status: DC
  Filled 2022-08-27 – 2022-09-09 (×2): qty 60, 30d supply, fill #0
  Filled 2022-10-29 – 2022-11-06 (×2): qty 60, 30d supply, fill #1
  Filled 2022-12-02: qty 60, 30d supply, fill #2
  Filled 2023-01-07: qty 60, 30d supply, fill #3
  Filled 2023-02-10 – 2023-02-18 (×2): qty 60, 30d supply, fill #4
  Filled 2023-03-13 – 2023-03-24 (×2): qty 60, 30d supply, fill #5
  Filled 2023-04-24: qty 60, 30d supply, fill #6
  Filled 2023-05-19: qty 60, 30d supply, fill #7
  Filled 2023-06-23: qty 60, 30d supply, fill #8
  Filled 2023-07-28: qty 60, 30d supply, fill #9
  Filled 2023-08-25: qty 60, 30d supply, fill #10

## 2022-08-27 MED ORDER — ATORVASTATIN CALCIUM 80 MG PO TABS
ORAL_TABLET | ORAL | 6 refills | Status: DC
  Filled 2022-08-27 – 2022-09-09 (×2): qty 30, 30d supply, fill #0
  Filled 2022-12-02 – 2023-01-27 (×2): qty 30, 30d supply, fill #1
  Filled 2023-03-13: qty 30, 30d supply, fill #2
  Filled 2023-04-24: qty 30, 30d supply, fill #3
  Filled 2023-05-19: qty 30, 30d supply, fill #4
  Filled 2023-06-23: qty 30, 30d supply, fill #5
  Filled 2023-07-28: qty 30, 30d supply, fill #6

## 2022-08-27 MED ORDER — BASAGLAR KWIKPEN 100 UNIT/ML ~~LOC~~ SOPN
40.0000 [IU] | PEN_INJECTOR | Freq: Every day | SUBCUTANEOUS | 2 refills | Status: DC
  Filled 2022-08-27: qty 9, 22d supply, fill #0

## 2022-08-27 MED ORDER — BASAGLAR KWIKPEN 100 UNIT/ML ~~LOC~~ SOPN
40.0000 [IU] | PEN_INJECTOR | Freq: Every day | SUBCUTANEOUS | 2 refills | Status: DC
  Filled 2022-08-27: qty 15, 37d supply, fill #0
  Filled 2022-09-09: qty 12, 30d supply, fill #0
  Filled 2022-10-07: qty 12, 30d supply, fill #1
  Filled 2022-11-18: qty 12, 30d supply, fill #2

## 2022-08-27 MED ORDER — SACUBITRIL-VALSARTAN 97-103 MG PO TABS
1.0000 | ORAL_TABLET | Freq: Two times a day (BID) | ORAL | 3 refills | Status: DC
  Filled 2022-08-27: qty 180, 90d supply, fill #0
  Filled 2022-10-17: qty 60, 30d supply, fill #0
  Filled 2022-12-02: qty 60, 30d supply, fill #1
  Filled 2022-12-02: qty 60, 30d supply, fill #0
  Filled 2022-12-02 – 2023-01-07 (×2): qty 60, 30d supply, fill #1
  Filled 2023-02-10 – 2023-02-18 (×2): qty 60, 30d supply, fill #2
  Filled 2023-03-13 – 2023-03-27 (×3): qty 60, 30d supply, fill #3
  Filled 2023-04-24: qty 60, 30d supply, fill #4
  Filled 2023-05-22: qty 60, 30d supply, fill #5
  Filled 2023-06-23: qty 60, 30d supply, fill #6
  Filled 2023-07-28: qty 60, 30d supply, fill #7
  Filled 2023-08-25: qty 60, 30d supply, fill #8

## 2022-08-27 MED ORDER — DAPAGLIFLOZIN PROPANEDIOL 10 MG PO TABS
10.0000 mg | ORAL_TABLET | Freq: Every day | ORAL | 11 refills | Status: DC
  Filled 2022-08-27: qty 30, 30d supply, fill #0
  Filled 2022-09-15 – 2022-09-19 (×3): qty 30, 30d supply, fill #1
  Filled 2022-10-15: qty 30, 30d supply, fill #2
  Filled 2022-11-18: qty 30, 30d supply, fill #3
  Filled 2022-12-17: qty 30, 30d supply, fill #4
  Filled 2023-01-07 – 2023-01-27 (×4): qty 30, 30d supply, fill #5
  Filled 2023-02-24: qty 30, 30d supply, fill #6
  Filled 2023-03-24: qty 30, 30d supply, fill #7
  Filled 2023-04-24: qty 30, 30d supply, fill #8
  Filled 2023-05-22: qty 30, 30d supply, fill #9
  Filled 2023-06-23: qty 30, 30d supply, fill #10
  Filled 2023-07-28: qty 30, 30d supply, fill #11

## 2022-08-27 MED ORDER — FUROSEMIDE 40 MG PO TABS
ORAL_TABLET | Freq: Every day | ORAL | 2 refills | Status: DC
  Filled 2022-08-27 – 2022-09-09 (×2): qty 90, fill #0
  Filled 2022-10-07: qty 30, 30d supply, fill #0
  Filled 2022-11-18: qty 30, 30d supply, fill #1

## 2022-08-27 MED ORDER — LANTUS SOLOSTAR 100 UNIT/ML ~~LOC~~ SOPN
40.0000 [IU] | PEN_INJECTOR | Freq: Every day | SUBCUTANEOUS | 2 refills | Status: DC
  Filled 2022-08-27: qty 15, 37d supply, fill #0

## 2022-08-27 MED ORDER — INSULIN PEN NEEDLE 31G X 5 MM MISC
6 refills | Status: DC
  Filled 2022-08-27 – 2023-04-24 (×6): qty 100, 30d supply, fill #0
  Filled 2023-05-19: qty 100, 30d supply, fill #1
  Filled 2023-06-23: qty 100, 30d supply, fill #2
  Filled 2023-07-28: qty 100, 30d supply, fill #3

## 2022-08-27 NOTE — Assessment & Plan Note (Signed)
Reassess renal function 

## 2022-08-27 NOTE — Assessment & Plan Note (Signed)
Currently compensated and taking his medications refills were issued

## 2022-08-27 NOTE — Assessment & Plan Note (Signed)
Compensated heart failure no changes made

## 2022-08-27 NOTE — Patient Instructions (Addendum)
Refills on all medications sent to our pharmacy downstairs  Please start insulin lispro as prescribed take 8 units 3 times a day before meals unless blood sugars less than 150 you must check your blood sugar 3 times a day  Increase insulin Lantus or once this runs out use the insulin Basaglar at 40 units a day  We will try to get the [place] for you 1 pill daily  No changes on all your other medications  Labs today include metabolic panel blood count lipid panel  Return to see Franky Macho our clinical pharmacist in 3 weeks then Dr. Delford Field 6 weeks

## 2022-08-27 NOTE — Assessment & Plan Note (Signed)
Blood pressure well controlled no changes made ?

## 2022-08-27 NOTE — Assessment & Plan Note (Signed)
Continue with statins.  ?

## 2022-08-27 NOTE — Assessment & Plan Note (Addendum)
Diabetes becoming more difficult to control he is not interested in a CGM  We were able to get his [name] approved and he picked up a bottle of this today at a 10 mg daily dosing  He will now resume his short acting insulin 3 times a day along with insulin glargine or Basaglar at a dose of 40 units a day  Referral to endocrinology was made

## 2022-08-28 LAB — CBC WITH DIFFERENTIAL/PLATELET
Basophils Absolute: 0.1 10*3/uL (ref 0.0–0.2)
Basos: 1 %
EOS (ABSOLUTE): 0.2 10*3/uL (ref 0.0–0.4)
Eos: 3 %
Hematocrit: 44.2 % (ref 37.5–51.0)
Hemoglobin: 14.5 g/dL (ref 13.0–17.7)
Immature Grans (Abs): 0 10*3/uL (ref 0.0–0.1)
Immature Granulocytes: 0 %
Lymphocytes Absolute: 2.8 10*3/uL (ref 0.7–3.1)
Lymphs: 31 %
MCH: 27.8 pg (ref 26.6–33.0)
MCHC: 32.8 g/dL (ref 31.5–35.7)
MCV: 85 fL (ref 79–97)
Monocytes Absolute: 0.7 10*3/uL (ref 0.1–0.9)
Monocytes: 8 %
Neutrophils Absolute: 5.2 10*3/uL (ref 1.4–7.0)
Neutrophils: 57 %
Platelets: 223 10*3/uL (ref 150–450)
RBC: 5.22 x10E6/uL (ref 4.14–5.80)
RDW: 12.2 % (ref 11.6–15.4)
WBC: 9 10*3/uL (ref 3.4–10.8)

## 2022-08-28 LAB — COMPREHENSIVE METABOLIC PANEL
ALT: 22 IU/L (ref 0–44)
AST: 16 IU/L (ref 0–40)
Albumin/Globulin Ratio: 1.8 (ref 1.2–2.2)
Albumin: 4.8 g/dL (ref 4.1–5.1)
Alkaline Phosphatase: 77 IU/L (ref 44–121)
BUN/Creatinine Ratio: 21 — ABNORMAL HIGH (ref 9–20)
BUN: 31 mg/dL — ABNORMAL HIGH (ref 6–24)
Bilirubin Total: 0.5 mg/dL (ref 0.0–1.2)
CO2: 22 mmol/L (ref 20–29)
Calcium: 9.6 mg/dL (ref 8.7–10.2)
Chloride: 98 mmol/L (ref 96–106)
Creatinine, Ser: 1.5 mg/dL — ABNORMAL HIGH (ref 0.76–1.27)
Globulin, Total: 2.6 g/dL (ref 1.5–4.5)
Glucose: 331 mg/dL — ABNORMAL HIGH (ref 70–99)
Potassium: 5.3 mmol/L — ABNORMAL HIGH (ref 3.5–5.2)
Sodium: 137 mmol/L (ref 134–144)
Total Protein: 7.4 g/dL (ref 6.0–8.5)
eGFR: 57 mL/min/{1.73_m2} — ABNORMAL LOW (ref >59)

## 2022-08-28 LAB — LIPID PANEL
Chol/HDL Ratio: 4.4 ratio (ref 0.0–5.0)
Cholesterol, Total: 184 mg/dL (ref 100–199)
HDL: 42 mg/dL (ref >39)
LDL Chol Calc (NIH): 112 mg/dL — ABNORMAL HIGH (ref 0–99)
Triglycerides: 169 mg/dL — ABNORMAL HIGH (ref 0–149)
VLDL Cholesterol Cal: 30 mg/dL (ref 5–40)

## 2022-08-28 NOTE — Progress Notes (Signed)
Let pt know kidney stable liver normal, cholesterol is high, blood count normal, stay on cholesterol pill daily

## 2022-08-30 ENCOUNTER — Telehealth: Payer: Self-pay

## 2022-08-30 NOTE — Telephone Encounter (Signed)
Pt was called and is aware of results, DOB was confirmed.  ?

## 2022-08-30 NOTE — Telephone Encounter (Signed)
-----   Message from Storm Frisk, MD sent at 08/28/2022  6:08 AM EDT ----- Let pt know kidney stable liver normal, cholesterol is high, blood count normal, stay on cholesterol pill daily

## 2022-09-09 ENCOUNTER — Other Ambulatory Visit: Payer: Self-pay | Admitting: Critical Care Medicine

## 2022-09-09 ENCOUNTER — Other Ambulatory Visit: Payer: Self-pay

## 2022-09-10 ENCOUNTER — Ambulatory Visit (INDEPENDENT_AMBULATORY_CARE_PROVIDER_SITE_OTHER): Payer: 59 | Admitting: Podiatry

## 2022-09-10 ENCOUNTER — Other Ambulatory Visit: Payer: Self-pay

## 2022-09-10 ENCOUNTER — Ambulatory Visit: Payer: 59 | Admitting: Sports Medicine

## 2022-09-10 DIAGNOSIS — E11621 Type 2 diabetes mellitus with foot ulcer: Secondary | ICD-10-CM

## 2022-09-10 DIAGNOSIS — L97422 Non-pressure chronic ulcer of left heel and midfoot with fat layer exposed: Secondary | ICD-10-CM

## 2022-09-10 MED ORDER — OMEPRAZOLE 20 MG PO CPDR
20.0000 mg | DELAYED_RELEASE_CAPSULE | Freq: Every day | ORAL | 1 refills | Status: DC
  Filled 2022-09-10: qty 30, 30d supply, fill #0
  Filled 2022-10-15: qty 30, 30d supply, fill #1
  Filled 2022-11-18: qty 30, 30d supply, fill #2
  Filled 2022-12-17: qty 30, 30d supply, fill #3
  Filled 2023-01-07: qty 30, 30d supply, fill #4
  Filled 2023-02-10 – 2023-02-18 (×2): qty 30, 30d supply, fill #5

## 2022-09-10 NOTE — Telephone Encounter (Signed)
Requested Prescriptions  Pending Prescriptions Disp Refills  . omeprazole (PRILOSEC) 20 MG capsule 60 capsule 4    Sig: Take 1 capsule (20 mg total) by mouth daily.     Gastroenterology: Proton Pump Inhibitors Passed - 09/09/2022  8:00 AM      Passed - Valid encounter within last 12 months    Recent Outpatient Visits          2 weeks ago Uncontrolled type 2 diabetes mellitus with hyperglycemia [facility])   [place] [facility] Storm Frisk, MD   4 months ago Diabetes mellitus type 2 in obese [facility])   [facility] Storm Frisk, MD   5 months ago Upper respiratory tract infection, unspecified type   [facility] [place] [place], NP   10 months ago Controlled type 2 diabetes mellitus with stage 3 chronic kidney disease, with long-term current use of insulin [facility])   [place] [facility] Storm Frisk, MD   1 year ago Controlled type 2 diabetes mellitus with stage 3 chronic kidney disease, with long-term current use of insulin [facility])   [facility] Storm Frisk, MD      Future Appointments            In 1 week Madelyn Brunner, DO [facility]   In 1 month Drucilla Chalet, RPH-CPP [facility]   In 2 months Delford Field Charlcie Cradle, MD [name]

## 2022-09-10 NOTE — Progress Notes (Signed)
  Subjective:  Patient ID: [name], male    DOB: 1971/09/18,  MRN: [id number]  Chief Complaint  Patient presents with   Wound Check    3 week follow up wound check- ulcer of left midfoot- changing dressing daily-gentamycin ointment applied to ulcer. Patient is concern about the white skin around the ulcer. He believes the dressing is causing moisture to the ulcer.     51 y.o. male presents with the above complaint. History confirmed with patient.  Skin became macerated and noticed a new area opening up below this  Objective:  Physical Exam: warm, good capillary refill, no trophic changes or ulcerative lesions, normal DP and PT pulses, and full-thickness ulceration left lateral foot measuring 1.0 cm x 0.3 cm 0.2 cm, granular wound bed no exposed bone tendon or joint no malodorous tissue, new ulceration proximal plantar to this with surrounding periwound maceration measuring 1.5 x 1.0 x 0.2 cm          Assessment:   1. Diabetic ulcer of left midfoot associated with type 2 diabetes mellitus, with fat layer exposed (HCC)      Plan:  Patient was evaluated and treated and all questions answered.  Ulcer left foot -We discussed the etiology and factors that are a part of the wound healing process.  We also discussed the risk of infection both soft tissue and osteomyelitis from open ulceration.  Discussed the risk of limb loss if this happens or worsens. -Debridement as below. -Dressed with Iodosorb, DSD. -Continue home dressing changes daily with gentamicin until Monday then switch to Prisma.  Advised if he sees maceration such as it is in appearance today to plan to use Betadine ointment to dry out the area from periwound maceration.  Discussed even when using Prisma may need to change overlying gauze dressings for fluid management -Vascular testing not indicated -HgbA1c: A1c has increased again to 13.9%.  I suspect this is a large reason his wounds are having poor  healing -Last antibiotics: No signs of localized infection did not prescribe him oral antibiotics currently   Procedure: Excisional Debridement of Wound Rationale: Removal of non-viable soft tissue from the wound to promote healing.  Anesthesia: none Post-Debridement Wound Measurements: 1.0 x 0.3 x 0.2 cm; 1.0 x 1.5 x 0.2 cm Type of Debridement: Sharp Excisional Tissue Removed: Non-viable soft tissue Depth of Debridement: subcutaneous tissue. Technique: Sharp excisional debridement to bleeding, viable wound base.  Dressing: Dry, sterile, compression dressing. Disposition: Patient tolerated procedure well.      Return in about 3 weeks (around 10/01/2022) for wound care.

## 2022-09-14 ENCOUNTER — Encounter: Payer: Self-pay | Admitting: Podiatry

## 2022-09-15 ENCOUNTER — Other Ambulatory Visit (HOSPITAL_COMMUNITY): Payer: Self-pay

## 2022-09-15 DIAGNOSIS — G4733 Obstructive sleep apnea (adult) (pediatric): Secondary | ICD-10-CM | POA: Diagnosis not present

## 2022-09-16 ENCOUNTER — Ambulatory Visit: Payer: Self-pay | Admitting: *Deleted

## 2022-09-16 ENCOUNTER — Other Ambulatory Visit: Payer: Self-pay

## 2022-09-16 ENCOUNTER — Telehealth: Payer: Self-pay | Admitting: Podiatry

## 2022-09-16 ENCOUNTER — Other Ambulatory Visit (HOSPITAL_COMMUNITY): Payer: Self-pay

## 2022-09-16 MED ORDER — DOXYCYCLINE HYCLATE 100 MG PO TABS
100.0000 mg | ORAL_TABLET | Freq: Two times a day (BID) | ORAL | 0 refills | Status: DC
  Filled 2022-09-16: qty 20, 10d supply, fill #0

## 2022-09-16 NOTE — Telephone Encounter (Signed)
Pt called and states he sent the picture to Dr Sherryle Lis Via my chart of left foot/ scab came off and puss came out. He is asking if he needed to come in or if an antibiotic could be sent in. I did transfer to the nurse as well.

## 2022-09-16 NOTE — Telephone Encounter (Signed)
Message disregarded per message and see where his podiatrist has taken care of the situation regarding his toe.

## 2022-09-16 NOTE — Telephone Encounter (Signed)
Pt called again to see if he needed to get seen today or if someone could just send him in an antibiotic. He uses Music therapist.[name]

## 2022-09-16 NOTE — Telephone Encounter (Signed)
Message from [place] sent at 09/16/2022  1:16 PM EDT  Summary: pus from toe   Pt reports that his foot doctor office finally called him back and they sent in a Rx so please disregard previous message.   ----- Message from Nani Ravens sent at 09/16/2022 12:36 PM EDT -----  Pt called in for assistance. Pt says that he had pus coming from his toe. Pt says that he has had a toe removed before and fear having to have another one removed. Pt would like to know if provider could send in a antibiotic? Pt says that he has sent a pic in mychart to his podiatrist. Dr. Sherryle Lis. (Can view in chart) pt would like to discuss further.           Call History   Type Contact Phone/Fax User  09/16/2022 01:15 PM EDT Phone (Incoming) Glenn Seeds "Ray" (Self) [phone] [place]  09/16/2022 11:53 AM EDT Phone (Incoming) Glenn Seeds "Ray" (Self) [phone] Lemmie Evens) [place], [place] C

## 2022-09-17 ENCOUNTER — Ambulatory Visit: Payer: 59 | Admitting: Sports Medicine

## 2022-09-19 ENCOUNTER — Other Ambulatory Visit: Payer: Self-pay

## 2022-09-20 ENCOUNTER — Other Ambulatory Visit: Payer: Self-pay

## 2022-09-23 ENCOUNTER — Ambulatory Visit (INDEPENDENT_AMBULATORY_CARE_PROVIDER_SITE_OTHER): Payer: 59 | Admitting: Podiatry

## 2022-09-23 DIAGNOSIS — E11621 Type 2 diabetes mellitus with foot ulcer: Secondary | ICD-10-CM

## 2022-09-23 DIAGNOSIS — L97422 Non-pressure chronic ulcer of left heel and midfoot with fat layer exposed: Secondary | ICD-10-CM

## 2022-09-23 DIAGNOSIS — Z899 Acquired absence of limb, unspecified: Secondary | ICD-10-CM

## 2022-09-23 NOTE — Progress Notes (Signed)
Subjective:  Patient ID: [name], male    DOB: May 18, 1971,  MRN: [id number]  Chief Complaint  Patient presents with   Wound Check    Left wound check/bottom of left foot. Patient is currently taking Doxycycline twice a day. Denies any drainage or pus since starting antibiotics.     51 y.o. male presents with the above complaint.  Here for a ulceration present at the plantar aspect of his left midfoot.  Prior history of fifth ray amputation done by Dr. Earleen Newport last year.  Has been taking doxycycline twice a day.  Past Medical History:  Diagnosis Date   Acute systolic CHF (congestive heart failure) ([place]) 10/28/2016   CAD (coronary artery disease)    NSTEMI 10/2011: LHC 11/04/11: pLAD 90%, mLAD 60-70%, small D2 sub totally occluded at ostium, small OM1 90% ostial, 90% mid, mOM2 30%, oPL 80%, RCA 30%, dPDA 80%, EF 20% with ant AK.  PCI:  Promus DES to pLAD.   Cellulitis of left foot 06/22/2020   Chronic kidney disease    CKD   Chronic systolic heart failure (HCC)    Diabetic ulcer of left midfoot associated with type 2 diabetes mellitus, limited to breakdown of skin ([place])    Diabetic ulcer of left midfoot associated with type 2 diabetes mellitus, with muscle involvement without evidence of necrosis ([place])    DM2 (diabetes mellitus, type 2) ([place])    type 2   GERD (gastroesophageal reflux disease)    HTN (hypertension)    Hyperlipidemia    Ischemic cardiomyopathy    Echo 11/03/11: mod LVH, mild focal basal septal hypertrophy, EF 15%, grade 2 diast dysfxn, mild MR, mild to mod LAE, mild RVE, mild to mod reduced RVSF.  EF 3/5 50% by echo   Myocardial infarction [facility])    Neuromuscular disorder (HCC)    neuropathy feet   Obesity    Osteomyelitis of fifth toe of left foot ([place]) 06/22/2020   Ulcer of right foot ([place]) 06/22/2020    Allergies  Allergen Reactions   Ozempic (0.25 Or 0.5 Mg-Dose) [Semaglutide(0.25 Or 0.5mg -Dos)] Other (See Comments)    Severe abdominal pain   Jardiance  [Empagliflozin] Rash    ROS: Negative except as per HPI above  Objective:  General: AAO x3, NAD  Dermatological: Circular ulceration present at the plantar aspect of the fifth metatarsal stump site at site of prior partial fifth ray amputation on the left foot.  There is also some maceration and small opening at the amputation site.  No erythema no drainage no malodor no significant signs of infection overall very clean and healthy granular base present.  See pre and postdebridement pictures below.  Vascular:  Dorsalis Pedis artery and Posterior Tibial artery pedal pulses are 2/4 bilateral.  Capillary fill time brisk < 3 sec. Pedal hair growth present. No varicosities and no lower extremity edema present bilateral. There is no pain with calf compression, swelling, warmth, erythema.   Neruologic: Grossly diminished via light touch and protective sensation bilaterally. Patellar and Achilles deep tendon reflexes 2+ bilateral. Negative Babinski reflex.   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  Gait: Unassisted, Nonantalgic.      Assessment:   1. Diabetic ulcer of left midfoot associated with type 2 diabetes mellitus, with fat layer exposed ([place])   2. History of amputation      Plan:  Patient was evaluated and treated and all questions answered.  Ulcer plantar lateral aspect of the left midfoot at the site  of prior partial fifth ray amputation, mildly macerated but overall healthy not infected -We discussed the etiology and factors that are a part of the wound healing process.  We also discussed the risk of infection both soft tissue and osteomyelitis from open ulceration.  Discussed the risk of limb loss if this happens or worsens. -Debridement as below. -Dressed with Betadine wet-to-dry dressing, DSD. -Continue home dressing changes daily with Betadine wet to dry and gauze dressing -Continue off-loading with restricted weight bearing. -Last  antibiotics: Doxycycline, finished this course not indicated for further antibiotics at this time  Procedure: Excisional Debridement of Wound Rationale: Removal of non-viable soft tissue from the wound to promote healing.  Anesthesia: none Post-Debridement Wound Measurements: 3 cm x 5 cm x 0.2 cm  Type of Debridement: Sharp Excisional with 15 blade scalpel Tissue Removed: Non-viable soft tissue Depth of Debridement: subcutaneous tissue. Technique: Sharp excisional debridement to bleeding, viable wound base.  Dressing: Dry, sterile, compression dressing. Disposition: Patient tolerated procedure well.   Return in about 3 weeks (around 10/14/2022) for Left foot lateral forefoot ulcer.         Everitt Amber, DPM [facility]

## 2022-09-24 ENCOUNTER — Other Ambulatory Visit: Payer: Self-pay

## 2022-09-24 ENCOUNTER — Other Ambulatory Visit (HOSPITAL_COMMUNITY): Payer: Self-pay

## 2022-10-01 ENCOUNTER — Ambulatory Visit: Payer: 59 | Admitting: Podiatry

## 2022-10-07 ENCOUNTER — Other Ambulatory Visit: Payer: Self-pay

## 2022-10-08 ENCOUNTER — Other Ambulatory Visit: Payer: Self-pay

## 2022-10-14 ENCOUNTER — Other Ambulatory Visit: Payer: Self-pay

## 2022-10-14 ENCOUNTER — Ambulatory Visit: Payer: Self-pay

## 2022-10-14 ENCOUNTER — Telehealth: Payer: 59 | Admitting: Family Medicine

## 2022-10-14 ENCOUNTER — Ambulatory Visit: Payer: 59 | Admitting: Podiatry

## 2022-10-14 DIAGNOSIS — J209 Acute bronchitis, unspecified: Secondary | ICD-10-CM | POA: Diagnosis not present

## 2022-10-14 MED ORDER — ALBUTEROL SULFATE HFA 108 (90 BASE) MCG/ACT IN AERS
1.0000 | INHALATION_SPRAY | Freq: Four times a day (QID) | RESPIRATORY_TRACT | 0 refills | Status: DC | PRN
  Filled 2022-10-14: qty 18, 30d supply, fill #0

## 2022-10-14 MED ORDER — PREDNISONE 10 MG (21) PO TBPK
ORAL_TABLET | ORAL | 0 refills | Status: DC
  Filled 2022-10-14: qty 21, 6d supply, fill #0

## 2022-10-14 NOTE — Telephone Encounter (Signed)
Message from Luciana Axe sent at 10/14/2022  1:44 PM EDT  Summary: Cough advice   Pt is calling to report that he has had a cough for 2 weeks. No available appts in office. Pt is looking for advice. Please advise          Chief Complaint: cough  Symptoms: dry cough  Frequency:2-3 weeks Pertinent Negatives: Patient denies SOB, wheezing, fever,chest pain, runny nose Disposition: [] ED /[] Urgent Care (no appt availability in office) / [] Appointment(In office/virtual)/ [x]  [facility]/ [] Home Care/ [] Refused Recommended Disposition /[] [facility]/ []  Follow-up with PCP Additional Notes: n/a  Reason for Disposition  Cough has been present for > 3 weeks  Answer Assessment - Initial Assessment Questions 1. ONSET: "When did the cough begin?"      2 2. SEVERITY: "How bad is the cough today?"      Cough with  3. SPUTUM: "Describe the color of your sputum" (none, dry cough; clear, white, yellow, green)     Dry cough 4. HEMOPTYSIS: "Are you coughing up any blood?" If so ask: "How much?" (flecks, streaks, tablespoons, etc.)     no 5. DIFFICULTY BREATHING: "Are you having difficulty breathing?" If Yes, ask: "How bad is it?" (e.g., mild, moderate, severe)    - MILD: No SOB at rest, mild SOB with walking, speaks normally in sentences, can lie down, no retractions, pulse < 100.    - MODERATE: SOB at rest, SOB with minimal exertion and prefers to sit, cannot lie down flat, speaks in phrases, mild retractions, audible wheezing, pulse 100-120.    - SEVERE: Very SOB at rest, speaks in single words, struggling to breathe, sitting hunched forward, retractions, pulse > 120      none 6. FEVER: "Do you have a fever?" If Yes, ask: "What is your temperature, how was it measured, and when did it start?"     no 7. CARDIAC HISTORY: "Do you have any history of heart disease?" (e.g., heart attack, congestive heart failure)      CHF 8. LUNG HISTORY: "Do you have any history of lung  disease?"  (e.g., pulmonary embolus, asthma, emphysema)     none 9. PE RISK FACTORS: "Do you have a history of blood clots?" (or: recent major surgery, recent prolonged travel, bedridden)     no 10. OTHER SYMPTOMS: "Do you have any other symptoms?" (e.g., runny nose, wheezing, chest pain)       no 11. PREGNANCY: "Is there any chance you are pregnant?" "When was your last menstrual period?"       N/a 12. TRAVEL: "Have you traveled out of the country in the last month?" (e.g., travel history, exposures)       N/a  Protocols used: Cough - Acute Productive-A-AH

## 2022-10-14 NOTE — Patient Instructions (Signed)
[name], thank you for joining Perlie Mayo, NP for today's virtual visit.  While this provider is not your primary care provider (PCP), if your PCP is located in our provider database this encounter information will be shared with them immediately following your visit.  [facility] account gives you access to today's visit and all your visits, tests, and labs performed at [facility] " click here if you don't have a [facility] account or go to [email or web address]  Consent: (Patient) [name] provided verbal consent for this virtual visit at the beginning of the encounter.  Current Medications:  Current Outpatient Medications:    albuterol (VENTOLIN HFA) 108 (90 Base) MCG/ACT inhaler, Inhale 1 puff into the lungs every 6 (six) hours as needed for shortness of breath., Disp: 8 g, Rfl: 0   predniSONE (STERAPRED UNI-PAK 21 TAB) 10 MG (21) TBPK tablet, Take as directed, Disp: 21 tablet, Rfl: 0   acetaminophen (TYLENOL) 500 MG tablet, Take 500 mg by mouth every 6 (six) hours as needed., Disp: , Rfl:    aspirin 81 MG chewable tablet, Chew 1 tablet (81 mg total) by mouth daily., Disp: 30 tablet, Rfl: 6   atorvastatin (LIPITOR) 80 MG tablet, TAKE 1 TABLET (80 MG TOTAL) BY MOUTH EVERY EVENING., Disp: 90 tablet, Rfl: 6   Blood Glucose Monitoring Suppl (ONETOUCH VERIO) w/Device KIT, Use to check blood sugar 3 times daily., Disp: 1 kit, Rfl: 0   carvedilol (COREG) 6.25 MG tablet, TAKE 1 TABLET (6.25 MG TOTAL) BY MOUTH 2 (TWO) TIMES DAILY WITH A MEAL., Disp: 120 tablet, Rfl: 6   dapagliflozin propanediol (FARXIGA) 10 MG TABS tablet, Take 1 tablet (10 mg total) by mouth daily., Disp: 30 tablet, Rfl: 11   doxycycline (VIBRA-TABS) 100 MG tablet, Take 1 tablet (100 mg total) by mouth 2 (two) times daily., Disp: 20 tablet, Rfl: 0   Evolocumab (REPATHA SURECLICK) 175 MG/ML SOAJ, INJECT 1 PEN INTO THE SKIN EVERY 14 (FOURTEEN) DAYS., Disp: 2 mL, Rfl: 11    furosemide (LASIX) 40 MG tablet, TAKE 1 TABLET (40 MG TOTAL) BY MOUTH DAILY., Disp: 90 tablet, Rfl: 2   gentamicin ointment (GARAMYCIN) 0.1 %, Apply 1 Application topically 3 (three) times daily., Disp: 30 g, Rfl: 0   glucose blood test strip, Use to test blood sugar 3 times a day, Disp: 100 each, Rfl: 0   Insulin Glargine (BASAGLAR KWIKPEN) 100 UNIT/ML, Inject 40 Units into the skin daily., Disp: 15 mL, Rfl: 2   insulin lispro (HUMALOG KWIKPEN) 100 UNIT/ML KwikPen, Inject 8 Units into the skin 3 (three) times daily before meals. Hold if blood sugar is less than 150, Disp: 15 mL, Rfl: 11   Insulin Pen Needle 31G X 5 MM MISC, USE TO INJECT INSULIN AT BEDTIME., Disp: 100 each, Rfl: 6   Lancets (ONETOUCH DELICA PLUS ZWCHEN27P) MISC, USE TO CHECK BLOOD SUGAR 3 TIMES DAILY., Disp: 100 each, Rfl: 2   meloxicam (MOBIC) 15 MG tablet, Take 1 tablet (15 mg total) by mouth daily., Disp: 30 tablet, Rfl: 0   methocarbamol (ROBAXIN) 500 MG tablet, Take 1 tablet (500 mg total) by mouth 2 (two) times daily as needed for muscle spasms., Disp: 20 tablet, Rfl: 0   omeprazole (PRILOSEC) 20 MG capsule, Take 1 capsule (20 mg total) by mouth daily., Disp: 90 capsule, Rfl: 1   sacubitril-valsartan (ENTRESTO) 97-103 MG, Take 1 tablet by mouth 2 (two) times daily., Disp: 180 tablet, Rfl: 3   spironolactone (ALDACTONE)  50 MG tablet, Take 1 tablet (50 mg total) by mouth daily., Disp: 90 tablet, Rfl: 0 No current facility-administered medications for this visit.  Facility-Administered Medications Ordered in Other Visits:    vancomycin (VANCOCIN) powder, , , PRN, Evelina Bucy, DPM, 1,000 mg at 01/24/21 1411   Medications ordered in this encounter:  Meds ordered this encounter  Medications   predniSONE (STERAPRED UNI-PAK 21 TAB) 10 MG (21) TBPK tablet    Sig: Take as directed    Dispense:  21 tablet    Refill:  0    Order Specific Question:   Supervising Provider    Answer:   Chase Picket [[id number]]   albuterol  (VENTOLIN HFA) 108 (90 Base) MCG/ACT inhaler    Sig: Inhale 1 puff into the lungs every 6 (six) hours as needed for shortness of breath.    Dispense:  8 g    Refill:  0    Order Specific Question:   Supervising Provider    Answer:   Chase Picket [id number]     *If you need refills on other medications prior to your next appointment, please contact your pharmacy*  Follow-Up: Call back or seek an in-person evaluation if the symptoms worsen or if the condition fails to improve as anticipated.  [facility] [phone]  Other Instructions Acute Bronchitis, Adult  Acute bronchitis is when air tubes in the lungs (bronchi) suddenly get swollen. The condition can make it hard for you to breathe. In adults, acute bronchitis usually goes away within 2 weeks. A cough caused by bronchitis may last up to 3 weeks. Smoking, allergies, and asthma can make the condition worse. What are the causes? Germs that cause cold and flu (viruses). The most common cause of this condition is the virus that causes the common cold. Bacteria. Substances that bother (irritate) the lungs, including: Smoke from cigarettes and other types of tobacco. Dust and pollen. Fumes from chemicals, gases, or burned fuel. Indoor or outdoor air pollution. What increases the risk? A weak body's defense system. This is also called the immune system. Any condition that affects your lungs and breathing, such as asthma. What are the signs or symptoms? A cough. Coughing up clear, yellow, or green mucus. Making high-pitched whistling sounds when you breathe, most often when you breathe out (wheezing). Runny or stuffy nose. Having too much mucus in your lungs (chest congestion). Shortness of breath. Body aches. A sore throat. How is this treated? Acute bronchitis may go away over time without treatment. Your doctor may tell you to: Drink more fluids. This will help thin your mucus so it is easier to cough  up. Use a device that gets medicine into your lungs (inhaler). Use a vaporizer or a humidifier. These are machines that add water to the air. This helps with coughing and poor breathing. Take a medicine that thins mucus and helps clear it from your lungs. Take a medicine that prevents or stops coughing. It is not common to take an antibiotic medicine for this condition. Follow these instructions at home:  Take over-the-counter and prescription medicines only as told by your doctor. Use an inhaler, vaporizer, or humidifier as told by your doctor. Take two teaspoons (10 mL) of honey at bedtime. This helps lessen your coughing at night. Drink enough fluid to keep your pee (urine) pale yellow. Do not smoke or use any products that contain nicotine or tobacco. If you need help quitting, ask your doctor. Get a lot  of rest. Return to your normal activities when your doctor says that it is safe. Keep all follow-up visits. How is this prevented?  Wash your hands often with soap and water for at least 20 seconds. If you cannot use soap and water, use hand sanitizer. Avoid contact with people who have cold symptoms. Try not to touch your mouth, nose, or eyes with your hands. Avoid breathing in smoke or chemical fumes. Make sure to get the flu shot every year. Contact a doctor if: Your symptoms do not get better in 2 weeks. You have trouble coughing up the mucus. Your cough keeps you awake at night. You have a fever. Get help right away if: You cough up blood. You have chest pain. You have very bad shortness of breath. You faint or keep feeling like you are going to faint. You have a very bad headache. Your fever or chills get worse. These symptoms may be an emergency. Get help right away. Call your local emergency services (911 in the [place] Do not wait to see if the symptoms will go away. Do not drive yourself to the hospital. Summary Acute bronchitis is when air tubes in the lungs  (bronchi) suddenly get swollen. In adults, acute bronchitis usually goes away within 2 weeks. Drink more fluids. This will help thin your mucus so it is easier to cough up. Take over-the-counter and prescription medicines only as told by your doctor. Contact a doctor if your symptoms do not improve after 2 weeks of treatment. This information is not intended to replace advice given to you by your health care provider. Make sure you discuss any questions you have with your health care provider. Document Revised: 04/18/2021 Document Reviewed: 04/18/2021 Elsevier Patient Education  [place].   If you have been instructed to have an in-person evaluation today at a local Urgent Care facility, please use the link below. It will take you to a list of all of our available [facility], including address, phone number and hours of operation. Please do not delay care.  [facility]  If you or a family member do not have a primary care provider, use the link below to schedule a visit and establish care. When you choose a [facility] primary care physician or advanced practice provider, you gain a long-term partner in health. Find a Primary Care Provider  Learn more about 's in-office and virtual care options: [facility]

## 2022-10-14 NOTE — Progress Notes (Signed)
Virtual Visit Consent   [name], you are scheduled for a virtual visit with a [facility] provider today. Just as with appointments in the office, your consent must be obtained to participate. Your consent will be active for this visit and any virtual visit you may have with one of our providers in the next 365 days. If you have a MyChart account, a copy of this consent can be sent to you electronically.  As this is a virtual visit, video technology does not allow for your provider to perform a traditional examination. This may limit your provider's ability to fully assess your condition. If your provider identifies any concerns that need to be evaluated in person or the need to arrange testing (such as labs, EKG, etc.), we will make arrangements to do so. Although advances in technology are sophisticated, we cannot ensure that it will always work on either your end or our end. If the connection with a video visit is poor, the visit may have to be switched to a telephone visit. With either a video or telephone visit, we are not always able to ensure that we have a secure connection.  By engaging in this virtual visit, you consent to the provision of healthcare and authorize for your insurance to be billed (if applicable) for the services provided during this visit. Depending on your insurance coverage, you may receive a charge related to this service.  I need to obtain your verbal consent now. Are you willing to proceed with your visit today? [name] has provided verbal consent on 10/14/2022 for a virtual visit (video or telephone). Glenn Mayo, NP  Date: 10/14/2022 3:17 PM  Virtual Visit via Video Note   I, [name], connected with  [name]  ([id number], Feb 28, 1971) on 10/14/22 at  3:15 PM EDT by a video-enabled telemedicine application and verified that I am speaking with the correct person using two identifiers.  Location: Patient: Virtual Visit Location Patient:  Home Provider: Virtual Visit Location Provider: Home Office   I discussed the limitations of evaluation and management by telemedicine and the availability of in person appointments. The patient expressed understanding and agreed to proceed.    History of Present Illness: [name] is a 51 y.o. who identifies as a male who was assigned male at birth, and is being seen today for persistent cough for last 2-3 weeks. No other symptoms. This occurred post a URI cold.  Took some Robitussin OTC and Cold and Flu OTC, and has used Mucinex. They help- but cough returns. It is dry. No fevers or other symptoms at this time.   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 ([place]) 10/31/2021   Erectile dysfunction 03/26/2021   Osteomyelitis, chronic, ankle or foot, left ([place])    Hypersomnia 12/19/2020   CKD (chronic kidney disease), stage III ([place]) 06/22/2020   Mixed diabetic hyperlipidemia associated with type 2 diabetes mellitus ([place]) 06/22/2020   Testosterone deficiency 11/29/2016   Chronic combined systolic and diastolic congestive heart failure ([place]) 11/13/2011   CAD 11/13/2011   Ischemic cardiomyopathy 11/04/2011   History of MI, acute, non ST segment elevation ([place]) 11/03/2011   Uncontrolled type 2 diabetes mellitus with hyperglycemia ([place]) 12/14/2010   Obesity 12/14/2010   Essential hypertension 12/14/2010    Allergies:  Allergies  Allergen Reactions   Ozempic (0.25 Or 0.5 Mg-Dose) [Semaglutide(0.25 Or 0.5mg -Dos)] Other (See Comments)    Severe  abdominal pain   Jardiance [Empagliflozin] Rash   Medications:  Current Outpatient Medications:    acetaminophen (TYLENOL) 500 MG tablet, Take 500 mg by mouth every 6 (six) hours as needed., Disp: , Rfl:    aspirin 81 MG chewable tablet, Chew 1 tablet (81 mg total) by mouth daily., Disp: 30 tablet, Rfl: 6   atorvastatin (LIPITOR) 80 MG tablet, TAKE 1 TABLET (80 MG TOTAL)  BY MOUTH EVERY EVENING., Disp: 90 tablet, Rfl: 6   Blood Glucose Monitoring Suppl (ONETOUCH VERIO) w/Device KIT, Use to check blood sugar 3 times daily., Disp: 1 kit, Rfl: 0   carvedilol (COREG) 6.25 MG tablet, TAKE 1 TABLET (6.25 MG TOTAL) BY MOUTH 2 (TWO) TIMES DAILY WITH A MEAL., Disp: 120 tablet, Rfl: 6   dapagliflozin propanediol (FARXIGA) 10 MG TABS tablet, Take 1 tablet (10 mg total) by mouth daily., Disp: 30 tablet, Rfl: 11   doxycycline (VIBRA-TABS) 100 MG tablet, Take 1 tablet (100 mg total) by mouth 2 (two) times daily., Disp: 20 tablet, Rfl: 0   Evolocumab (REPATHA SURECLICK) 578 MG/ML SOAJ, INJECT 1 PEN INTO THE SKIN EVERY 14 (FOURTEEN) DAYS., Disp: 2 mL, Rfl: 11   furosemide (LASIX) 40 MG tablet, TAKE 1 TABLET (40 MG TOTAL) BY MOUTH DAILY., Disp: 90 tablet, Rfl: 2   gentamicin ointment (GARAMYCIN) 0.1 %, Apply 1 Application topically 3 (three) times daily., Disp: 30 g, Rfl: 0   glucose blood test strip, Use to test blood sugar 3 times a day, Disp: 100 each, Rfl: 0   Insulin Glargine (BASAGLAR KWIKPEN) 100 UNIT/ML, Inject 40 Units into the skin daily., Disp: 15 mL, Rfl: 2   insulin lispro (HUMALOG KWIKPEN) 100 UNIT/ML KwikPen, Inject 8 Units into the skin 3 (three) times daily before meals. Hold if blood sugar is less than 150, Disp: 15 mL, Rfl: 11   Insulin Pen Needle 31G X 5 MM MISC, USE TO INJECT INSULIN AT BEDTIME., Disp: 100 each, Rfl: 6   Lancets (ONETOUCH DELICA PLUS IONGEX52W) MISC, USE TO CHECK BLOOD SUGAR 3 TIMES DAILY., Disp: 100 each, Rfl: 2   meloxicam (MOBIC) 15 MG tablet, Take 1 tablet (15 mg total) by mouth daily., Disp: 30 tablet, Rfl: 0   methocarbamol (ROBAXIN) 500 MG tablet, Take 1 tablet (500 mg total) by mouth 2 (two) times daily as needed for muscle spasms., Disp: 20 tablet, Rfl: 0   omeprazole (PRILOSEC) 20 MG capsule, Take 1 capsule (20 mg total) by mouth daily., Disp: 90 capsule, Rfl: 1   sacubitril-valsartan (ENTRESTO) 97-103 MG, Take 1 tablet by mouth 2 (two)  times daily., Disp: 180 tablet, Rfl: 3   spironolactone (ALDACTONE) 50 MG tablet, Take 1 tablet (50 mg total) by mouth daily., Disp: 90 tablet, Rfl: 0 No current facility-administered medications for this visit.  Facility-Administered Medications Ordered in Other Visits:    vancomycin (VANCOCIN) powder, , , PRN, Evelina Bucy, DPM, 1,000 mg at 01/24/21 1411  Observations/Objective: Patient is well-developed, well-nourished in no acute distress.  Resting comfortably  at home.  Head is normocephalic, atraumatic.  No labored breathing.  Speech is clear and coherent with logical content.  Patient is alert and oriented at baseline.  Dry cough noted   Assessment and Plan:  1. Bronchitis with bronchospasm  - predniSONE (STERAPRED UNI-PAK 21 TAB) 10 MG (21) TBPK tablet; Take as directed  Dispense: 21 tablet; Refill: 0 - albuterol (VENTOLIN HFA) 108 (90 Base) MCG/ACT inhaler; Inhale 1 puff into the lungs every 6 (six) hours as  needed for shortness of breath.  Dispense: 8 g; Refill: 0  -rest -hydrate- keep throat lubricated -use above medications but have low threshold for side effects given DM and CHF- advised to stop meds if changes occur like rapid HR or dizziness. He is advised to be seen in person if they occur.   Reviewed side effects, risks and benefits of medication.    Patient acknowledged agreement and understanding of the plan.   Past Medical, Surgical, Social History, Allergies, and Medications have been Reviewed.   Follow Up Instructions: I discussed the assessment and treatment plan with the patient. The patient was provided an opportunity to ask questions and all were answered. The patient agreed with the plan and demonstrated an understanding of the instructions.  A copy of instructions were sent to the patient via MyChart unless otherwise noted below.    The patient was advised to call back or seek an in-person evaluation if the symptoms worsen or if the condition  fails to improve as anticipated.  Time:  I spent 10 minutes with the patient via telehealth technology discussing the above problems/concerns.    Glenn Mayo, NP

## 2022-10-15 ENCOUNTER — Ambulatory Visit: Payer: Self-pay | Admitting: Pharmacist

## 2022-10-15 DIAGNOSIS — G4733 Obstructive sleep apnea (adult) (pediatric): Secondary | ICD-10-CM | POA: Diagnosis not present

## 2022-10-16 ENCOUNTER — Other Ambulatory Visit: Payer: Self-pay

## 2022-10-17 ENCOUNTER — Other Ambulatory Visit (HOSPITAL_COMMUNITY): Payer: Self-pay

## 2022-10-17 ENCOUNTER — Ambulatory Visit: Payer: 59 | Admitting: Podiatry

## 2022-10-17 DIAGNOSIS — E11621 Type 2 diabetes mellitus with foot ulcer: Secondary | ICD-10-CM

## 2022-10-17 DIAGNOSIS — L97422 Non-pressure chronic ulcer of left heel and midfoot with fat layer exposed: Secondary | ICD-10-CM

## 2022-10-18 NOTE — Progress Notes (Signed)
  Subjective:  Patient ID: [name], male    DOB: 03/26/1971,  MRN: [id number]  Chief Complaint  Patient presents with   Wound Check    3 WEEK FOLLOW UP LEFT WOUND CHECK / BOTTOM OF FOOT    51 y.o. male presents with the above complaint. History confirmed with patient.  He says it is doing better  Objective:  Physical Exam: warm, good capillary refill, no trophic changes or ulcerative lesions, normal DP and PT pulses, and full-thickness ulceration left lateral foot measuring 1.6 x 2.2 x 0.2 cm, granular wound bed no exposed bone tendon or joint no malodorous tissue,           Assessment:   1. Diabetic ulcer of left midfoot associated with type 2 diabetes mellitus, with fat layer exposed ([place])      Plan:  Patient was evaluated and treated and all questions answered.  Ulcer left foot -We discussed the etiology and factors that are a part of the wound healing process.  We also discussed the risk of infection both soft tissue and osteomyelitis from open ulceration.  Discussed the risk of limb loss if this happens or worsens. -Debridement as below. -Dressed with silver alginate, DSD. -Continue dressing changes at home with silver alginate and gauze dressings. -Vascular testing not indicated -HgbA1c: A1c has increased again to 13.9%.  I suspect this is a large reason his wounds are having poor healing -Last antibiotics: No signs of localized infection did not prescribe him oral antibiotics currently -Change offloading to peg assist and surgical shoe this was dispensed today   Procedure: Excisional Debridement of Wound Rationale: Removal of non-viable soft tissue from the wound to promote healing.  Anesthesia: none Post-Debridement Wound Measurements: 1.0 x 0.3 x 0.2 cm; 1.0 x 1.5 x 0.2 cm Type of Debridement: Sharp Excisional Tissue Removed: Non-viable soft tissue Depth of Debridement: subcutaneous tissue. Technique: Sharp excisional debridement to bleeding, viable  wound base.  Dressing: Dry, sterile, compression dressing. Disposition: Patient tolerated procedure well.      No follow-ups on file.

## 2022-10-23 ENCOUNTER — Telehealth: Payer: Self-pay | Admitting: Pharmacist

## 2022-10-23 NOTE — Telephone Encounter (Signed)
-----   Message from Ramond Dial, [place] sent at 10/22/2022  5:04 PM EDT ----- Regarding: FW: pa  ----- Message ----- From: Charlann Boxer, CPhT Sent: 10/21/2022  12:02 PM EDT To: Leeroy Bock, RPH-CPP Subject: Valetta Mole [place]  [id number]

## 2022-10-23 NOTE — Telephone Encounter (Signed)
Was going to complete PA however he has allergy listed for Ozempic as "severe abdominal pain"

## 2022-10-23 NOTE — Telephone Encounter (Signed)
Ok thank you, I will delete the request.  Kathlee Nations

## 2022-10-29 ENCOUNTER — Other Ambulatory Visit: Payer: Self-pay

## 2022-10-30 ENCOUNTER — Telehealth: Payer: Self-pay | Admitting: Pharmacist

## 2022-10-30 NOTE — Telephone Encounter (Signed)
[name],   I was unsure who to send this to. It looks like this patient was started on Repatha several years ago by [place]. She is out until the end of this month so I am hoping you can help give [place] some direction.   It looks like this patient hasn't followed with the lipid clinic in some time. Our patient advocate here at [facility] had a PA denied for Repatha, stating that his insurance prefers Praluent. Would you or someone on his Cardiology team be able to send in a rx for Praluent? Or would he need to be seen first?   Thank you for all you do!

## 2022-11-01 NOTE — Telephone Encounter (Signed)
Prior authorization was submitted for Praluent. Had to submit as a new start since they asked for labs within 6 months. His most recently labs appear to be off of [place].

## 2022-11-04 ENCOUNTER — Other Ambulatory Visit: Payer: Self-pay

## 2022-11-04 NOTE — Telephone Encounter (Signed)
PA denied- insurance stating patient needs to fail zetia first. Will appeal decision.

## 2022-11-05 ENCOUNTER — Other Ambulatory Visit: Payer: Self-pay

## 2022-11-06 ENCOUNTER — Other Ambulatory Visit: Payer: Self-pay

## 2022-11-06 ENCOUNTER — Telehealth: Payer: Self-pay | Admitting: Critical Care Medicine

## 2022-11-06 NOTE — Telephone Encounter (Signed)
  [name],    I was unsure who to send this to. It looks like this patient was started on Repatha several years ago by [place]. She is out until the end of this month so I am hoping you can help give [place] some direction.    It looks like this patient hasn't followed with the lipid clinic in some time. Our patient advocate here at [facility] had a PA denied for Repatha recently, and now he has switched insurances. Would you or someone on his Cardiology team be able to submit a PA for Repatha? Or would he need to be seen first?    Thank you for all you do!

## 2022-11-06 NOTE — Telephone Encounter (Addendum)
Pt has changed to [name] from [name] needs a PA for Evolocumab (REPATHA SURECLICK) 140 MG/ML SOAJ / pt would like to keep taking this medication if possible since it works well for him / please advise asap or advise if pt needs an alternative medication

## 2022-11-07 ENCOUNTER — Other Ambulatory Visit: Payer: Self-pay

## 2022-11-07 ENCOUNTER — Ambulatory Visit: Payer: 59 | Admitting: Podiatry

## 2022-11-07 ENCOUNTER — Encounter: Payer: Self-pay | Admitting: Podiatry

## 2022-11-07 ENCOUNTER — Other Ambulatory Visit: Payer: Self-pay | Admitting: Pharmacist

## 2022-11-07 DIAGNOSIS — E1169 Type 2 diabetes mellitus with other specified complication: Secondary | ICD-10-CM

## 2022-11-07 DIAGNOSIS — L97422 Non-pressure chronic ulcer of left heel and midfoot with fat layer exposed: Secondary | ICD-10-CM

## 2022-11-07 DIAGNOSIS — E11621 Type 2 diabetes mellitus with foot ulcer: Secondary | ICD-10-CM

## 2022-11-07 NOTE — Telephone Encounter (Addendum)
PA submitted for Repatha.  Key:  [id number].  PA resolved, patient should be able to fill now.  Your PA has been resolved, no additional PA is required.

## 2022-11-08 NOTE — Telephone Encounter (Signed)
Appeals faxed to insurance.

## 2022-11-10 NOTE — Progress Notes (Signed)
  Subjective:  Patient ID: [name], male    DOB: [birth year],  MRN: [id number]  Chief Complaint  Patient presents with   Diabetic Ulcer    3 week follow up left    51 y.o. male presents with the above complaint. History confirmed with patient.   Objective:  Physical Exam: warm, good capillary refill, no trophic changes or ulcerative lesions, normal DP and PT pulses, and full-thickness ulceration left lateral foot measuring 3.3 x 2.0 x 0.3 cm, granular wound bed no exposed bone tendon or joint no malodorous tissue,           Assessment:   1. Diabetic ulcer of left midfoot associated with type 2 diabetes mellitus, with fat layer exposed ([facility])       Plan:  Patient was evaluated and treated and all questions answered.  Ulcer left foot -We discussed the etiology and factors that are a part of the wound healing process.  We also discussed the risk of infection both soft tissue and osteomyelitis from open ulceration.  Discussed the risk of limb loss if this happens or worsens. -Debridement as below. -Dressed with silver alginate, DSD. -Continue dressing changes at home with silver alginate and gauze dressings. -Vascular testing not indicated -HgbA1c: A1c has increased again to 13.9%.  I suspect this is a large reason his wounds are having poor healing -Last antibiotics: No signs of localized infection did not prescribe him oral antibiotics currently -Continue Pegasys and surgical shoe -Again has had an increase in wound size.  We discussed the option of a total contact cast to offload the lesion further.  He declined this today because of his work restrictions.  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  Anesthesia: none Post-Debridement Wound Measurements: 3.3 x 2.0 x 0.3 cm Type of  Debridement: Sharp Excisional Tissue Removed: Non-viable soft tissue Depth of Debridement: subcutaneous tissue. Technique: Sharp excisional debridement to bleeding, viable wound base.  Dressing: Dry, sterile, compression dressing. Disposition: Patient tolerated procedure well.      No follow-ups on file.

## 2022-11-11 ENCOUNTER — Other Ambulatory Visit: Payer: Self-pay

## 2022-11-12 ENCOUNTER — Other Ambulatory Visit: Payer: Self-pay

## 2022-11-13 ENCOUNTER — Other Ambulatory Visit: Payer: Self-pay

## 2022-11-14 ENCOUNTER — Other Ambulatory Visit: Payer: Self-pay

## 2022-11-15 ENCOUNTER — Telehealth: Payer: Self-pay | Admitting: Emergency Medicine

## 2022-11-15 DIAGNOSIS — G4733 Obstructive sleep apnea (adult) (pediatric): Secondary | ICD-10-CM | POA: Diagnosis not present

## 2022-11-15 NOTE — Telephone Encounter (Signed)
Copied from CRM [id number]. Topic: General - Other >> Nov 15, 2022  8:47 AM [name] wrote: Pt requesting a cb from [facility] regarding 11-21 appt  Please fu w/ pt

## 2022-11-18 ENCOUNTER — Other Ambulatory Visit: Payer: Self-pay

## 2022-11-18 NOTE — Telephone Encounter (Signed)
Attempting PA for Praluent.

## 2022-11-18 NOTE — Telephone Encounter (Signed)
PA key: [id number]

## 2022-11-19 ENCOUNTER — Ambulatory Visit: Payer: 59 | Admitting: Pharmacist

## 2022-11-20 ENCOUNTER — Other Ambulatory Visit: Payer: Self-pay

## 2022-11-28 ENCOUNTER — Telehealth: Payer: Self-pay

## 2022-11-28 ENCOUNTER — Ambulatory Visit: Payer: 59 | Admitting: Podiatry

## 2022-11-28 ENCOUNTER — Ambulatory Visit: Payer: 59 | Attending: Critical Care Medicine | Admitting: Critical Care Medicine

## 2022-11-28 ENCOUNTER — Other Ambulatory Visit: Payer: Self-pay

## 2022-11-28 ENCOUNTER — Encounter: Payer: Self-pay | Admitting: Critical Care Medicine

## 2022-11-28 VITALS — BP 118/83 | HR 87 | Temp 98.0°F | Ht 78.0 in | Wt 278.6 lb

## 2022-11-28 DIAGNOSIS — E1169 Type 2 diabetes mellitus with other specified complication: Secondary | ICD-10-CM | POA: Diagnosis not present

## 2022-11-28 DIAGNOSIS — M86672 Other chronic osteomyelitis, left ankle and foot: Secondary | ICD-10-CM | POA: Diagnosis not present

## 2022-11-28 DIAGNOSIS — N183 Chronic kidney disease, stage 3 unspecified: Secondary | ICD-10-CM | POA: Diagnosis not present

## 2022-11-28 DIAGNOSIS — Z1211 Encounter for screening for malignant neoplasm of colon: Secondary | ICD-10-CM | POA: Diagnosis not present

## 2022-11-28 DIAGNOSIS — I255 Ischemic cardiomyopathy: Secondary | ICD-10-CM

## 2022-11-28 DIAGNOSIS — I10 Essential (primary) hypertension: Secondary | ICD-10-CM | POA: Diagnosis not present

## 2022-11-28 DIAGNOSIS — G629 Polyneuropathy, unspecified: Secondary | ICD-10-CM | POA: Insufficient documentation

## 2022-11-28 DIAGNOSIS — E1165 Type 2 diabetes mellitus with hyperglycemia: Secondary | ICD-10-CM | POA: Diagnosis not present

## 2022-11-28 DIAGNOSIS — E782 Mixed hyperlipidemia: Secondary | ICD-10-CM

## 2022-11-28 LAB — POCT GLYCOSYLATED HEMOGLOBIN (HGB A1C): HbA1c, POC (controlled diabetic range): 12.5 % — AB (ref 0.0–7.0)

## 2022-11-28 LAB — GLUCOSE, POCT (MANUAL RESULT ENTRY): POC Glucose: 207 mg/dl — AB (ref 70–99)

## 2022-11-28 MED ORDER — FUROSEMIDE 40 MG PO TABS
40.0000 mg | ORAL_TABLET | Freq: Every day | ORAL | 2 refills | Status: DC
  Filled 2022-11-28: qty 90, fill #0
  Filled 2022-12-17: qty 30, 30d supply, fill #0
  Filled 2023-01-20 – 2023-01-27 (×2): qty 30, 30d supply, fill #1
  Filled 2023-02-24: qty 30, 30d supply, fill #2
  Filled 2023-03-24: qty 30, 30d supply, fill #3
  Filled 2023-04-24: qty 30, 30d supply, fill #4

## 2022-11-28 MED ORDER — SPIRONOLACTONE 50 MG PO TABS
50.0000 mg | ORAL_TABLET | Freq: Every day | ORAL | 3 refills | Status: DC
  Filled 2022-11-28: qty 90, 90d supply, fill #0
  Filled 2022-12-17: qty 30, 30d supply, fill #0
  Filled 2023-01-20 – 2023-01-27 (×2): qty 30, 30d supply, fill #1
  Filled 2023-02-24: qty 30, 30d supply, fill #2
  Filled 2023-03-24: qty 30, 30d supply, fill #3
  Filled 2023-04-24: qty 30, 30d supply, fill #4
  Filled 2023-05-22: qty 30, 30d supply, fill #5
  Filled 2023-06-23: qty 30, 30d supply, fill #6
  Filled 2023-07-28: qty 30, 30d supply, fill #7
  Filled 2023-08-25: qty 30, 30d supply, fill #8
  Filled 2023-10-10 (×3): qty 30, 30d supply, fill #9

## 2022-11-28 MED ORDER — BASAGLAR KWIKPEN 100 UNIT/ML ~~LOC~~ SOPN
50.0000 [IU] | PEN_INJECTOR | Freq: Every day | SUBCUTANEOUS | 2 refills | Status: DC
  Filled 2022-11-28 – 2022-12-09 (×3): qty 15, 30d supply, fill #0
  Filled 2023-01-07: qty 15, 30d supply, fill #1
  Filled 2023-02-10 – 2023-02-18 (×2): qty 15, 30d supply, fill #2

## 2022-11-28 MED ORDER — PRALUENT 150 MG/ML ~~LOC~~ SOAJ
150.0000 mg | SUBCUTANEOUS | 6 refills | Status: DC
  Filled 2022-11-28: qty 2, 28d supply, fill #0

## 2022-11-28 MED ORDER — INSULIN LISPRO (1 UNIT DIAL) 100 UNIT/ML (KWIKPEN)
10.0000 [IU] | PEN_INJECTOR | Freq: Three times a day (TID) | SUBCUTANEOUS | 11 refills | Status: DC
  Filled 2022-11-28 – 2023-05-19 (×3): qty 15, 50d supply, fill #0

## 2022-11-28 NOTE — Assessment & Plan Note (Signed)
Reassess renal function 

## 2022-11-28 NOTE — Assessment & Plan Note (Signed)
Maintains with uncontrolled diabetes A1c up to 12-1/2 plan is to increase insulin Basaglar to 50 units a day increase short acting insulin to 10 units 3 times a day and continue with [place].  Note patient will be referred back to endocrinology

## 2022-11-28 NOTE — Assessment & Plan Note (Signed)
Hyperlipidemia not yet well-controlled we will add Praluent to high-dose atorvastatin

## 2022-11-28 NOTE — Patient Instructions (Signed)
Begin Praluent 1 injection every 2 weeks sent to our pharmacy  Referral to endocrinology will be sent again  Labs today metabolic panel and urine sample for protein also you will be given a cancer screening kit  Please get an eye exam in this calendar year or early next year  Increase Lantus insulin Basaglar to 50 units daily  Increase insulin short acting to 10 units 3 times daily before meals  Keep follow-ups with podiatry and cardiology  Return to Dr. Joya Gaskins 2 months

## 2022-11-28 NOTE — Telephone Encounter (Signed)
PRIOR AUTHORIZATION FOR PRALUENT SUBMITTED VIA [employer] TODAY Key: [id number]

## 2022-11-28 NOTE — Assessment & Plan Note (Signed)
Patient is euvolemic no change in heart failure meds

## 2022-11-28 NOTE — Assessment & Plan Note (Signed)
High risk for amputation continue wound care per podiatry

## 2022-11-28 NOTE — Progress Notes (Signed)
Established Patient Office Visit  Subjective:  Patient ID: [name], male    DOB: 1971-01-14  Age: 51 y.o. MRN: [id number]  CC:  Chief Complaint  Patient presents with   Diabetes   Hypertension    HPI 10/2021 [name] presents for primary care follow-up visit for diabetes hypertension coronary disease and chronic systolic heart failure.  Since the last visit the patient had to stop Jardiance because of a fungal rash in the groin.  He is maintaining glipizide Lantus at this time.  He is being considered for potential Trulicity.  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  He developed cellulitis over the dorsal aspect of that hand and we did a telephone visit last week and prescribed Bactrim for this and has resolved the cellulitis  Patient has an upcoming appointment with the pharmacist at the heart vascular clinic to discuss further changes in his diabetic medications.  He is stable on all of his heart medicines on arrival blood pressure is 117/84 .  His hemoglobin A1c on arrival is 7.1 this is an improvement from prior values.  He has been having follow-up with podiatry and he has healed his left foot ulcer he does have a Charcot foot on the right and has some internal fractures which are being watched he does not have any new ulcerations.  5/11 This patient is seen in return follow-up and unfortunately he could not tolerate Ozempic and had to stop this due to abdominal pain.  He previously could not tolerate Jardiance due to a rash.  We attempted to get him Wilder Glade and this has been denied by [employer].  In the interim the patient's maintain insulin Lantus at 20 units a day and his blood sugar is gone up to 300.  A1c now is greater than 10.  He is not following a healthy diet.  He has been followed closely by podiatry and his wounds of all healed from his prior surgeries.  He is due a colon cancer screening.  Patient's states his  breathing is stable without any cough or shortness of breath.  There is no chest pain.  8/29 Patient seen in return follow-up unfortunately A1c today is now greater than 13 he has not been using his short acting insulin he did not know how to operate his new glucometer that his insurance when he changed required him to receive.  He was putting the strips and backwards.  Patient is only taking the insulin Lantus at a dose of 30 units a day.  He is not able to get the [place] as of yet because of insurance barriers as well.  Patient's been with podiatry they are gradually working on a diabetic ulcer of the left foot trying to save his toe.  Blood pressure on arrival is good 123/88.  He has no other complaints.  Does have a lot of stress in his life at this time.  He would be a good candidate for endocrinology and I will make another referral no one is yet called him.  Also he would be a good candidate for continuous glucometer but he refuses to accept that at this time he rather do the fingersticks  11/30   Patient seen in return follow-up he has significant ulceration on the left foot followed by podiatry and underwent controlled diabetes A1c now 12-1/2 blood sugar 207.  Blood pressure today is good 118/83.  He was not able to get Repatha he  needs prior authorization failed they will accept Praluent.  Patient with ischemic heart disease heart failure is stable at this time. We tried to refer him to endocrinology but he no showed for his appointment on 19 October, the patient disagrees with that he states no one called him with the appointment time  Below is from the most recent podiatry visit November 9   Ulcer left foot -We discussed the etiology and factors that are a part of the wound healing process.  We also discussed the risk of infection both soft tissue and osteomyelitis from open ulceration.  Discussed the risk of limb loss if this happens or worsens. -Debridement as below. -Dressed with  silver alginate, DSD. -Continue dressing changes at home with silver alginate and gauze dressings. -Vascular testing not indicated -HgbA1c: A1c has increased again to 13.9%.  I suspect this is a large reason his wounds are having poor healing -Last antibiotics: No signs of localized infection did not prescribe him oral antibiotics currently -Continue Pegasys and surgical shoe -Again has had an increase in wound size.  We discussed the option of a total contact cast to offload the lesion further.  He declined this today because of his work restrictions.  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  Anesthesia: none Post-Debridement Wound Measurements: 3.3 x 2.0 x 0.3 cm Type of Debridement: Sharp Excisional Tissue Removed: Non-viable soft tissue Depth of Debridement: subcutaneous tissue. Technique: Sharp excisional debridement to bleeding, viable wound base.  Dressing: Dry, sterile, compression dressing. Disposition: Patient tolerated procedure well.  Past Medical History:  Diagnosis Date   Acute systolic CHF (congestive heart failure) ([place]) 10/28/2016   CAD (coronary artery disease)    NSTEMI 10/2011: LHC 11/04/11: pLAD 90%, mLAD 60-70%, small D2 sub totally occluded at ostium, small OM1 90% ostial, 90% mid, mOM2 30%, oPL 80%, RCA 30%, dPDA 80%, EF 20% with ant AK.  PCI:  Promus DES to pLAD.   Cellulitis of left foot 06/22/2020   Chronic kidney disease    CKD   Chronic systolic heart failure (HCC)    Diabetic ulcer of left midfoot associated with type 2 diabetes mellitus, limited to breakdown of skin ([place])    Diabetic ulcer of left midfoot associated with type 2 diabetes mellitus, with muscle involvement without evidence of necrosis ([place])    DM2 (diabetes mellitus, type 2) ([place])    type 2   GERD  (gastroesophageal reflux disease)    HTN (hypertension)    Hyperlipidemia    Ischemic cardiomyopathy    Echo 11/03/11: mod LVH, mild focal basal septal hypertrophy, EF 15%, grade 2 diast dysfxn, mild MR, mild to mod LAE, mild RVE, mild to mod reduced RVSF.  EF 3/5 50% by echo   Myocardial infarction [facility])    Neuromuscular disorder ([place])    neuropathy feet   Obesity    Osteomyelitis of fifth toe of left foot ([place]) 06/22/2020   Ulcer of right foot ([place]) 06/22/2020    Past Surgical History:  Procedure Laterality Date   AMPUTATION Left 06/23/2020   Procedure: AMPUTATION RAY 5th;  Surgeon: Trula Slade, DPM;  Location: [place];  Service: Podiatry;  Laterality: Left;   BONE BIOPSY Left 12/20/2020   Procedure: BONE BIOPSY X 3;  Surgeon: Evelina Bucy, DPM;  [facility];  Service: Podiatry;  Laterality: Left;   CARDIAC CATHETERIZATION N/A 10/25/2016   Procedure: Right/Left Heart Cath and Coronary Angiography;  Surgeon: Leonie Man, MD;  Location: [place] [facility];  Service: Cardiovascular;  Laterality: N/A;   CARDIAC CATHETERIZATION N/A 10/25/2016   Procedure: Coronary Stent Intervention;  Surgeon: Leonie Man, MD;  Location: [place] [facility];  Service: Cardiovascular;  Laterality: N/A;   CARDIAC CATHETERIZATION N/A 10/28/2016   Procedure: Coronary Stent Intervention;  Surgeon: Burnell Blanks, MD;  Location: [place] [facility];  Service: Cardiovascular;  Laterality: N/A;   Feet surgery Bilateral    GRAFT APPLICATION Left 54/62/7035   Procedure: APPLICATION OF SKIN GRAFT USING [employer] BILAYER MATRIX WOUND DRESSING;  Surgeon: Evelina Bucy, DPM;  Location: [place];  Service: Podiatry;  Laterality: Left;   I & D EXTREMITY Left 07/14/2020   Procedure: IRRIGATION AND DEBRIDEMENT LEFT FOOT;  Surgeon: Evelina Bucy, DPM;  Location: [place];  Service: Podiatry;  Laterality: Left;   LEFT AND RIGHT HEART CATHETERIZATION WITH CORONARY ANGIOGRAM N/A 11/04/2011   Procedure:  LEFT AND RIGHT HEART CATHETERIZATION WITH CORONARY ANGIOGRAM;  Surgeon: Minus Breeding, MD;  Location: [facility] CATH LAB;  Service: Cardiovascular;  Laterality: N/A;   METATARSAL HEAD EXCISION Left 01/24/2021   Procedure: 4th METATARSAL HEAD EXCISION, Proximal phalanx resection;  Surgeon: Evelina Bucy, DPM;  [facility];  Service: Podiatry;  Laterality: Left;   METATARSAL HEAD EXCISION Right 03/30/2021   Procedure: PROXIMAL 5TH TOE RESECTION ON RIGHT;  Surgeon: Evelina Bucy, DPM;  [facility];  Service: Podiatry;  Laterality: Right;   PERCUTANEOUS CORONARY STENT INTERVENTION (PCI-S)  11/04/2011   Procedure: PERCUTANEOUS CORONARY STENT INTERVENTION (PCI-S);  Surgeon: Sherren Mocha, MD;  Location: [facility] CATH LAB;  Service: Cardiovascular;;   WOUND DEBRIDEMENT Left 06/26/2020   Procedure: LEFT FOOT WOUND DEBRIDEMENT AND GRAFT APPLICATION;  Surgeon: Trula Slade, DPM;  Location: [place];  Service: Podiatry;  Laterality: Left;   WOUND DEBRIDEMENT Left 12/20/2020   Procedure: DEBRIDEMENT WOUND AND POSSIBLE CLOSURE;  Surgeon: Evelina Bucy, DPM;  [facility];  Service: Podiatry;  Laterality: Left;   WOUND DEBRIDEMENT Left 01/24/2021   Procedure: DEBRIDEMENT WOUND;  Surgeon: Evelina Bucy, DPM;  [facility];  Service: Podiatry;  Laterality: Left;   WOUND DEBRIDEMENT Bilateral 03/30/2021   Procedure: DEBRIDEMENT WOUND WITH SKIN GRAFT SUBSTITUTE APPLICATION;  Surgeon: Evelina Bucy, DPM;  [facility];  Service: Podiatry;  Laterality: Bilateral;   WOUND DEBRIDEMENT Right 04/20/2021   Procedure: DEBRIDEMENT Gaylan Gerold FOOT;  Surgeon: Evelina Bucy, DPM;  [facility];  Service: Podiatry;  Laterality: Right;    Family History  Problem Relation Age of Onset   Heart disease Father    Heart disease Mother        MOTHER HAD CABG    Social History   Socioeconomic History   Marital status: Single    Spouse name: Not on file   Number of children: Not on file    Years of education: Not on file   Highest education level: Not on file  Occupational History   Not on file  Tobacco Use   Smoking status: Never   Smokeless tobacco: Never  Vaping Use   Vaping Use: Never used  Substance and Sexual Activity   Alcohol use: Not Currently    Alcohol/week: 1.0 standard drink of alcohol    Types: 1 Shots of liquor per week  Drug use: No   Sexual activity: Yes    Birth control/protection: None  Other Topics Concern   Not on file  Social History Narrative   Not on file   Social Determinants of Health   Financial Resource Strain: Low Risk  (05/26/2020)   Overall Financial Resource Strain (CARDIA)    Difficulty of Paying Living Expenses: Not very hard  Food Insecurity: No Food Insecurity (05/26/2020)   Hunger Vital Sign    Worried About Running Out of Food in the Last Year: Never true    Ran Out of Food in the Last Year: Never true  Transportation Needs: No Transportation Needs (05/26/2020)   PRAPARE - Hydrologist (Medical): No    Lack of Transportation (Non-Medical): No  Physical Activity: Not on file  Stress: Not on file  Social Connections: Not on file  Intimate Partner Violence: Not on file    Outpatient Medications Prior to Visit  Medication Sig Dispense Refill   acetaminophen (TYLENOL) 500 MG tablet Take 500 mg by mouth every 6 (six) hours as needed.     aspirin 81 MG chewable tablet Chew 1 tablet (81 mg total) by mouth daily. 30 tablet 6   atorvastatin (LIPITOR) 80 MG tablet TAKE 1 TABLET (80 MG TOTAL) BY MOUTH EVERY EVENING. 90 tablet 6   Blood Glucose Monitoring Suppl (ONETOUCH VERIO) w/Device KIT Use to check blood sugar 3 times daily. 1 kit 0   carvedilol (COREG) 6.25 MG tablet TAKE 1 TABLET (6.25 MG TOTAL) BY MOUTH 2 (TWO) TIMES DAILY WITH A MEAL. 120 tablet 6   dapagliflozin propanediol (FARXIGA) 10 MG TABS tablet Take 1 tablet (10 mg total) by mouth daily. 30 tablet 11   glucose blood test strip Use to  test blood sugar 3 times a day 100 each 0   Insulin Pen Needle 31G X 5 MM MISC USE TO INJECT INSULIN AT BEDTIME. 100 each 6   omeprazole (PRILOSEC) 20 MG capsule Take 1 capsule (20 mg total) by mouth daily. 90 capsule 1   sacubitril-valsartan (ENTRESTO) 97-103 MG Take 1 tablet by mouth 2 (two) times daily. 180 tablet 3   furosemide (LASIX) 40 MG tablet TAKE 1 TABLET (40 MG TOTAL) BY MOUTH DAILY. 90 tablet 2   Insulin Glargine (BASAGLAR KWIKPEN) 100 UNIT/ML Inject 40 Units into the skin daily. 15 mL 2   insulin lispro (HUMALOG KWIKPEN) 100 UNIT/ML KwikPen Inject 8 Units into the skin 3 (three) times daily before meals. Hold if blood sugar is less than 150 15 mL 11   spironolactone (ALDACTONE) 50 MG tablet Take 1 tablet (50 mg total) by mouth daily. 90 tablet 0   albuterol (VENTOLIN HFA) 108 (90 Base) MCG/ACT inhaler Inhale 1 puff into the lungs every 6 (six) hours as needed for shortness of breath. (Patient not taking: Reported on 11/28/2022) 18 g 0   doxycycline (VIBRA-TABS) 100 MG tablet Take 1 tablet (100 mg total) by mouth 2 (two) times daily. (Patient not taking: Reported on 11/28/2022) 20 tablet 0   Evolocumab (REPATHA SURECLICK) 993 MG/ML SOAJ INJECT 1 PEN INTO THE SKIN EVERY 14 (FOURTEEN) DAYS. (Patient not taking: Reported on 11/28/2022) 2 mL 11   gentamicin ointment (GARAMYCIN) 0.1 % Apply 1 Application topically 3 (three) times daily. (Patient not taking: Reported on 11/28/2022) 30 g 0   meloxicam (MOBIC) 15 MG tablet Take 1 tablet (15 mg total) by mouth daily. (Patient not taking: Reported on 11/28/2022) 30 tablet 0  methocarbamol (ROBAXIN) 500 MG tablet Take 1 tablet (500 mg total) by mouth 2 (two) times daily as needed for muscle spasms. (Patient not taking: Reported on 11/28/2022) 20 tablet 0   predniSONE (STERAPRED UNI-PAK 21 TAB) 10 MG (21) TBPK tablet Take as directed per package directions (Patient not taking: Reported on 11/28/2022) 21 tablet 0   Facility-Administered Medications  Prior to Visit  Medication Dose Route Frequency Provider Last Rate Last Admin   vancomycin (VANCOCIN) powder    PRN Evelina Bucy, DPM   1,000 mg at 01/24/21 1411    Allergies  Allergen Reactions   Ozempic (0.25 Or 0.5 Mg-Dose) [Semaglutide(0.25 Or 0.53m-Dos)] Other (See Comments)    Severe abdominal pain   Jardiance [Empagliflozin] Rash    ROS Review of Systems  Constitutional: Negative.   HENT: Negative.  Negative for ear pain, postnasal drip, rhinorrhea, sinus pressure, sore throat, trouble swallowing and voice change.   Eyes: Negative.   Respiratory: Negative.  Negative for apnea, cough, choking, chest tightness, shortness of breath, wheezing and stridor.   Cardiovascular: Negative.  Negative for chest pain, palpitations and leg swelling.  Gastrointestinal: Negative.  Negative for abdominal distention, abdominal pain, nausea and vomiting.  Genitourinary: Negative.   Musculoskeletal: Negative.  Negative for arthralgias and myalgias.  Skin:  Positive for wound. Negative for rash.  Allergic/Immunologic: Negative.  Negative for environmental allergies and food allergies.  Neurological: Negative.  Negative for dizziness, syncope, weakness and headaches.  Hematological: Negative.  Negative for adenopathy. Does not bruise/bleed easily.  Psychiatric/Behavioral: Negative.  Negative for agitation and sleep disturbance. The patient is not nervous/anxious.       Objective:    Physical Exam Vitals reviewed.  Constitutional:      Appearance: Normal appearance. He is well-developed. He is obese. He is not diaphoretic.  HENT:     Head: Normocephalic and atraumatic.     Nose: No nasal deformity, septal deviation, mucosal edema or rhinorrhea.     Right Sinus: No maxillary sinus tenderness or frontal sinus tenderness.     Left Sinus: No maxillary sinus tenderness or frontal sinus tenderness.     Mouth/Throat:     Pharynx: No oropharyngeal exudate.     Comments: Significant  gingivitis Eyes:     General: No scleral icterus.    Conjunctiva/sclera: Conjunctivae normal.     Pupils: Pupils are equal, round, and reactive to light.  Neck:     Thyroid: No thyromegaly.     Vascular: No carotid bruit or JVD.     Trachea: Trachea normal. No tracheal tenderness or tracheal deviation.  Cardiovascular:     Rate and Rhythm: Normal rate and regular rhythm.     Chest Wall: PMI is not displaced.     Pulses: Normal pulses. No decreased pulses.     Heart sounds: Normal heart sounds, S1 normal and S2 normal. Heart sounds not distant. No murmur heard.    No systolic murmur is present.     No diastolic murmur is present.     No friction rub. No gallop. No S3 or S4 sounds.  Pulmonary:     Effort: No tachypnea, accessory muscle usage or respiratory distress.     Breath sounds: No stridor. No decreased breath sounds, wheezing, rhonchi or rales.  Chest:     Chest wall: No tenderness.  Abdominal:     General: Bowel sounds are normal. There is no distension.     Palpations: Abdomen is soft. Abdomen is not rigid.  Tenderness: There is no abdominal tenderness. There is no guarding or rebound.  Musculoskeletal:        General: Normal range of motion.     Cervical back: Normal range of motion and neck supple. No edema, erythema or rigidity. No muscular tenderness. Normal range of motion.     Comments: Dressing on left foot was not removed pictures from podiatry visit 24 hours earlier were reviewed  Lymphadenopathy:     Head:     Right side of head: No submental or submandibular adenopathy.     Left side of head: No submental or submandibular adenopathy.     Cervical: No cervical adenopathy.  Skin:    General: Skin is warm and dry.     Coloration: Skin is not pale.     Findings: Lesion present. No rash.     Nails: There is no clubbing.     Comments: Photographs from last podiatry visit were reviewed he has a large wound being debrided on the left foot  Neurological:      Mental Status: He is alert and oriented to person, place, and time.     Sensory: No sensory deficit.  Psychiatric:        Speech: Speech normal.        Behavior: Behavior normal.     BP 118/83   Pulse 87   Temp 98 F (36.7 C) (Temporal)   Ht _0  (1.981 m)   Wt 278 lb 9.6 oz (126.4 kg) Comment: Boot on left foot.  SpO2 100%   BMI 32.20 kg/m  Wt Readings from Last 3 Encounters:  11/28/22 278 lb 9.6 oz (126.4 kg)  08/27/22 264 lb 6.4 oz (119.9 kg)  08/14/22 276 lb (125.2 kg)     Health Maintenance Due  Topic Date Due   OPHTHALMOLOGY EXAM  Never done   COLON CANCER SCREENING ANNUAL FOBT  Never done   Diabetic kidney evaluation - Urine ACR  10/31/2022    There are no preventive care reminders to display for this patient.  Lab Results  Component Value Date   TSH 2.38 06/14/2013   Lab Results  Component Value Date   WBC 9.0 08/27/2022   HGB 14.5 08/27/2022   HCT 44.2 08/27/2022   MCV 85 08/27/2022   PLT 223 08/27/2022   Lab Results  Component Value Date   NA 137 08/27/2022   K 5.3 (H) 08/27/2022   CO2 22 08/27/2022   GLUCOSE 331 (H) 08/27/2022   BUN 31 (H) 08/27/2022   CREATININE 1.50 (H) 08/27/2022   BILITOT 0.5 08/27/2022   ALKPHOS 77 08/27/2022   AST 16 08/27/2022   ALT 22 08/27/2022   PROT 7.4 08/27/2022   ALBUMIN 4.8 08/27/2022   CALCIUM 9.6 08/27/2022   ANIONGAP 8 09/14/2021   EGFR 57 (L) 08/27/2022   GFR 71.35 12/02/2011   Lab Results  Component Value Date   CHOL 184 08/27/2022   Lab Results  Component Value Date   HDL 42 08/27/2022   Lab Results  Component Value Date   LDLCALC 112 (H) 08/27/2022   Lab Results  Component Value Date   TRIG 169 (H) 08/27/2022   Lab Results  Component Value Date   CHOLHDL 4.4 08/27/2022   Lab Results  Component Value Date   HGBA1C 12.5 (A) 11/28/2022      Assessment & Plan:   Problem List Items Addressed This Visit       Cardiovascular and Mediastinum   Ischemic cardiomyopathy (Chronic)  Patient is euvolemic no change in heart failure meds      Relevant Medications   Alirocumab (PRALUENT) 150 MG/ML SOAJ   furosemide (LASIX) 40 MG tablet   spironolactone (ALDACTONE) 50 MG tablet   Essential hypertension    Hypertension controlled at this time no change in medicines      Relevant Medications   Alirocumab (PRALUENT) 150 MG/ML SOAJ   furosemide (LASIX) 40 MG tablet   spironolactone (ALDACTONE) 50 MG tablet     Endocrine   Uncontrolled type 2 diabetes mellitus with hyperglycemia (HCC) - Primary    Maintains with uncontrolled diabetes A1c up to 12-1/2 plan is to increase insulin Basaglar to 50 units a day increase short acting insulin to 10 units 3 times a day and continue with [place].  Note patient will be referred back to endocrinology      Relevant Medications   Insulin Glargine (BASAGLAR KWIKPEN) 100 UNIT/ML   insulin lispro (HUMALOG KWIKPEN) 100 UNIT/ML KwikPen   Other Relevant Orders   POCT glycosylated hemoglobin (Hb A1C) (Completed)   POCT glucose (manual entry) (Completed)   Microalbumin / creatinine urine ratio   BMP8+eGFR   Ambulatory referral to Endocrinology   Mixed diabetic hyperlipidemia associated with type 2 diabetes mellitus ([place])    Hyperlipidemia not yet well-controlled we will add Praluent to high-dose atorvastatin      Relevant Medications   Alirocumab (PRALUENT) 150 MG/ML SOAJ   furosemide (LASIX) 40 MG tablet   Insulin Glargine (BASAGLAR KWIKPEN) 100 UNIT/ML   insulin lispro (HUMALOG KWIKPEN) 100 UNIT/ML KwikPen   spironolactone (ALDACTONE) 50 MG tablet     Musculoskeletal and Integument   Osteomyelitis, chronic, ankle or foot, left (HCC)    High risk for amputation continue wound care per podiatry        Genitourinary   CKD (chronic kidney disease), stage III ([place])    Reassess renal function      Other Visit Diagnoses     Colon cancer screening       Relevant Orders   Fecal occult blood, imunochemical      Meds ordered  this encounter  Medications   Alirocumab (PRALUENT) 150 MG/ML SOAJ    Sig: Inject 150 mg into the skin every 14 (fourteen) days.    Dispense:  2 mL    Refill:  6   furosemide (LASIX) 40 MG tablet    Sig: Take 1 tablet (40 mg total) by mouth daily.    Dispense:  90 tablet    Refill:  2   Insulin Glargine (BASAGLAR KWIKPEN) 100 UNIT/ML    Sig: Inject 50 Units into the skin daily.    Dispense:  15 mL    Refill:  2   insulin lispro (HUMALOG KWIKPEN) 100 UNIT/ML KwikPen    Sig: Inject 10 Units into the skin 3 (three) times daily before meals. Hold if blood sugar is less than 150    Dispense:  15 mL    Refill:  11   spironolactone (ALDACTONE) 50 MG tablet    Sig: Take 1 tablet (50 mg total) by mouth daily.    Dispense:  90 tablet    Refill:  3  Up to start Praluent and patient declined flu vaccine will get colon cancer screening done Follow-up: Return in about 2 months (around 01/28/2023) for diabetes, chronic conditions.    Asencion Noble, MD

## 2022-11-28 NOTE — Assessment & Plan Note (Signed)
Hypertension controlled at this time no change in medicines

## 2022-11-29 ENCOUNTER — Telehealth: Payer: Self-pay

## 2022-11-29 ENCOUNTER — Other Ambulatory Visit: Payer: Self-pay

## 2022-11-29 LAB — BMP8+EGFR
BUN/Creatinine Ratio: 19 (ref 9–20)
BUN: 25 mg/dL — ABNORMAL HIGH (ref 6–24)
CO2: 24 mmol/L (ref 20–29)
Calcium: 10.1 mg/dL (ref 8.7–10.2)
Chloride: 99 mmol/L (ref 96–106)
Creatinine, Ser: 1.35 mg/dL — ABNORMAL HIGH (ref 0.76–1.27)
Glucose: 183 mg/dL — ABNORMAL HIGH (ref 70–99)
Potassium: 4.8 mmol/L (ref 3.5–5.2)
Sodium: 139 mmol/L (ref 134–144)
eGFR: 65 mL/min/{1.73_m2} (ref >59)

## 2022-11-29 NOTE — Telephone Encounter (Signed)
-----   Message from Storm Frisk, MD sent at 11/29/2022  5:46 AM EST ----- Labs normal

## 2022-11-29 NOTE — Telephone Encounter (Signed)
Pt was called and is aware of results, DOB was confirmed.  ?

## 2022-11-29 NOTE — Progress Notes (Signed)
Labs normal.

## 2022-11-30 LAB — MICROALBUMIN / CREATININE URINE RATIO
Creatinine, Urine: 21.3 mg/dL
Microalb/Creat Ratio: <14 mg/g creat (ref 0–29)
Microalbumin, Urine: <3 ug/mL

## 2022-12-01 NOTE — Progress Notes (Signed)
Let pt know urine normal for protein all other labs normal

## 2022-12-02 ENCOUNTER — Telehealth: Payer: Self-pay

## 2022-12-02 ENCOUNTER — Other Ambulatory Visit: Payer: Self-pay

## 2022-12-02 ENCOUNTER — Other Ambulatory Visit (HOSPITAL_COMMUNITY): Payer: Self-pay

## 2022-12-02 NOTE — Telephone Encounter (Signed)
-----   Message from Storm Frisk, MD sent at 12/01/2022  8:43 AM EST ----- Let pt know urine normal for protein all other labs normal

## 2022-12-02 NOTE — Telephone Encounter (Signed)
Denied, Zetia trial and failure required.

## 2022-12-02 NOTE — Telephone Encounter (Signed)
Called and patient was already aware of results via mychart

## 2022-12-03 ENCOUNTER — Other Ambulatory Visit (HOSPITAL_COMMUNITY): Payer: Self-pay

## 2022-12-03 ENCOUNTER — Other Ambulatory Visit: Payer: Self-pay

## 2022-12-03 ENCOUNTER — Ambulatory Visit: Payer: 59 | Admitting: Podiatry

## 2022-12-04 ENCOUNTER — Other Ambulatory Visit: Payer: Self-pay

## 2022-12-05 ENCOUNTER — Ambulatory Visit: Payer: 59 | Admitting: Podiatry

## 2022-12-05 ENCOUNTER — Other Ambulatory Visit: Payer: Self-pay

## 2022-12-05 DIAGNOSIS — E11621 Type 2 diabetes mellitus with foot ulcer: Secondary | ICD-10-CM

## 2022-12-05 DIAGNOSIS — L97422 Non-pressure chronic ulcer of left heel and midfoot with fat layer exposed: Secondary | ICD-10-CM | POA: Diagnosis not present

## 2022-12-05 DIAGNOSIS — Z899 Acquired absence of limb, unspecified: Secondary | ICD-10-CM

## 2022-12-05 NOTE — Progress Notes (Signed)
  Subjective:  Patient ID: [name], male    DOB: May 14, 1971,  MRN: [id number]  Chief Complaint  Patient presents with   Wound Check    3 week wound check    51 y.o. male presents with the above complaint. History confirmed with patient. States he thinks wound is getting better, decreased drainage/ maceration. Denies N/V/F/C.   Objective:  Physical Exam: warm, good capillary refill, no trophic changes or ulcerative lesions, normal DP and PT pulses, and full-thickness ulceration left lateral foot measuring 2.8 x 1.2 x 0.2 cm, granular wound bed no exposed bone tendon or joint no malodorous tissue,              Assessment:   1. Diabetic ulcer of left midfoot associated with type 2 diabetes mellitus, with fat layer exposed (HCC)   2. History of amputation   3. Type 2 diabetes mellitus with foot ulcer (CODE) (HCC)        Plan:  Patient was evaluated and treated and all questions answered.  Ulcer left foot -We discussed the etiology and factors that are a part of the wound healing process.  We also discussed the risk of infection both soft tissue and osteomyelitis from open ulceration.  Discussed the risk of limb loss if this happens or worsens. -Debridement as below. -Dressed with silver alginate, DSD. -Continue dressing changes at home with silver alginate and gauze dressings. -Vascular testing not indicated -HgbA1c: A1c has decreased form prior though still highly elevated. Continue decreasing A1c level as able.  -Last antibiotics: No signs of localized infection  -Continue Pegasys and surgical shoe -Wound size now decreasing, continue with current therapies.    Procedure: Excisional Debridement of Wound Rationale: Removal of non-viable soft tissue from the wound to promote healing.  Anesthesia: none Post-Debridement Wound Measurements: 2.8 x 1.2 x 0.3 cm Type of Debridement: Sharp Excisional with scalpel  Tissue Removed: Non-viable soft tissue Depth of  Debridement: subcutaneous tissue. Technique: Sharp excisional debridement to bleeding, viable wound base.  Dressing: Dry, sterile, compression dressing. Disposition: Patient tolerated procedure well.      Return in about 3 weeks (around 12/26/2022) for Wound care L lateral midfoot ulcer.

## 2022-12-09 ENCOUNTER — Other Ambulatory Visit: Payer: Self-pay

## 2022-12-12 ENCOUNTER — Telehealth: Payer: Self-pay | Admitting: Pharmacist

## 2022-12-12 ENCOUNTER — Other Ambulatory Visit: Payer: Self-pay

## 2022-12-12 MED ORDER — REPATHA SURECLICK 140 MG/ML ~~LOC~~ SOAJ
1.0000 mL | SUBCUTANEOUS | 11 refills | Status: DC
  Filled 2022-12-12: qty 2, 28d supply, fill #0
  Filled 2023-01-07 (×2): qty 2, 28d supply, fill #1
  Filled 2023-02-10 – 2023-02-18 (×2): qty 2, 28d supply, fill #2
  Filled 2023-02-24 – 2023-03-14 (×3): qty 2, 28d supply, fill #3
  Filled 2023-04-24: qty 2, 28d supply, fill #4
  Filled 2023-05-19: qty 2, 28d supply, fill #5
  Filled 2023-06-23: qty 2, 28d supply, fill #6
  Filled 2023-07-28: qty 2, 28d supply, fill #7
  Filled 2023-08-25: qty 2, 28d supply, fill #8
  Filled 2023-09-25: qty 2, 28d supply, fill #9
  Filled 2023-11-03: qty 2, 28d supply, fill #10
  Filled 2023-12-02 – 2023-12-08 (×3): qty 2, 28d supply, fill #11

## 2022-12-12 NOTE — Telephone Encounter (Signed)
Called pt insurance [phone] appeals dept and left VM for follow up on status

## 2022-12-12 NOTE — Telephone Encounter (Signed)
Received letter that appeals for [employer] has been overturned. Called pt and LVM for him to call back.

## 2022-12-13 ENCOUNTER — Other Ambulatory Visit: Payer: Self-pay

## 2022-12-15 DIAGNOSIS — G4733 Obstructive sleep apnea (adult) (pediatric): Secondary | ICD-10-CM | POA: Diagnosis not present

## 2022-12-16 ENCOUNTER — Ambulatory Visit: Payer: No Typology Code available for payment source | Admitting: Podiatry

## 2022-12-16 NOTE — Telephone Encounter (Signed)
Patient made aware. He has already picked up the Rx.

## 2022-12-17 ENCOUNTER — Other Ambulatory Visit: Payer: Self-pay

## 2022-12-19 ENCOUNTER — Other Ambulatory Visit: Payer: Self-pay

## 2022-12-26 ENCOUNTER — Other Ambulatory Visit: Payer: Self-pay

## 2022-12-26 ENCOUNTER — Ambulatory Visit: Payer: 59 | Admitting: Podiatry

## 2022-12-26 DIAGNOSIS — Z899 Acquired absence of limb, unspecified: Secondary | ICD-10-CM

## 2022-12-26 DIAGNOSIS — E11621 Type 2 diabetes mellitus with foot ulcer: Secondary | ICD-10-CM

## 2022-12-26 DIAGNOSIS — L97422 Non-pressure chronic ulcer of left heel and midfoot with fat layer exposed: Secondary | ICD-10-CM | POA: Diagnosis not present

## 2022-12-26 NOTE — Progress Notes (Signed)
  Subjective:  Patient ID: [name], male    DOB: 03-02-1971,  MRN: [id number]  Chief Complaint  Patient presents with   Wound Check    3 week wound check    51 y.o. male presents with the above complaint. History confirmed with patient. States he thinks wound is about the same as prior, minimal drainage/ maceration. Denies N/V/F/C.   Objective:  Physical Exam: warm, good capillary refill, no trophic changes or ulcerative lesions, normal DP and PT pulses, and full-thickness ulceration left lateral foot measuring 2.6 x 1.5 x 0.2 cm, granular wound bed no exposed bone tendon or joint no malodorous tissue, hyperkeratotic tissue margins.  Wound stable to slightly improved from prior.              Assessment:   1. Diabetic ulcer of left midfoot associated with type 2 diabetes mellitus, with fat layer exposed (HCC)   2. Type 2 diabetes mellitus with foot ulcer (CODE) (HCC)   3. History of amputation         Plan:  Patient was evaluated and treated and all questions answered.  Ulcer left foot at plantar lateral midfoot under fourth metatarsal base area without evidence of infection, wound stable -We discussed the etiology and factors that are a part of the wound healing process.  We also discussed the risk of infection both soft tissue and osteomyelitis from open ulceration.  Discussed the risk of limb loss if this happens or worsens. -Debridement as below. -Dressed with saline wet-to-dry and DSD. -Continue dressing changes at home with saline wet-to-dry gauze dressings -Vascular testing not indicated, wound bleeds well upon debridement -HgbA1c: A1c has decreased form prior though still highly elevated. Continue decreasing A1c level as able.  -Last antibiotics: No signs of localized infection, no indication for antibiotics -Continue Pegasys and surgical shoe -Will place referral to wound care center for ongoing wound care and more frequent debridements and dressing  changes as I believe the patient will benefit from both of these as well as specialty wound care products.   Procedure: Excisional Debridement of Wound Rationale: Removal of non-viable soft tissue from the wound to promote healing.  Anesthesia: none Post-Debridement Wound Measurements: 2.6 x 1.5 x 0.2 cm Type of Debridement: Sharp Excisional with scalpel  Tissue Removed: Non-viable soft tissue Depth of Debridement: subcutaneous tissue. Technique: Sharp excisional debridement to bleeding, viable wound base.  Dressing: Dry, sterile, compression dressing. Disposition: Patient tolerated procedure well.     Return in about 4 weeks (around 01/23/2023) for Follow-up left midfoot ulcer.

## 2023-01-07 ENCOUNTER — Other Ambulatory Visit: Payer: Self-pay

## 2023-01-13 ENCOUNTER — Other Ambulatory Visit: Payer: Self-pay

## 2023-01-14 ENCOUNTER — Other Ambulatory Visit: Payer: Self-pay

## 2023-01-15 ENCOUNTER — Other Ambulatory Visit: Payer: Self-pay

## 2023-01-15 ENCOUNTER — Encounter: Payer: 59 | Attending: Internal Medicine | Admitting: Internal Medicine

## 2023-01-15 DIAGNOSIS — E1122 Type 2 diabetes mellitus with diabetic chronic kidney disease: Secondary | ICD-10-CM | POA: Insufficient documentation

## 2023-01-15 DIAGNOSIS — N189 Chronic kidney disease, unspecified: Secondary | ICD-10-CM | POA: Insufficient documentation

## 2023-01-15 DIAGNOSIS — Z89422 Acquired absence of other left toe(s): Secondary | ICD-10-CM

## 2023-01-15 DIAGNOSIS — I5042 Chronic combined systolic (congestive) and diastolic (congestive) heart failure: Secondary | ICD-10-CM | POA: Insufficient documentation

## 2023-01-15 DIAGNOSIS — M869 Osteomyelitis, unspecified: Secondary | ICD-10-CM | POA: Insufficient documentation

## 2023-01-15 DIAGNOSIS — I252 Old myocardial infarction: Secondary | ICD-10-CM | POA: Insufficient documentation

## 2023-01-15 DIAGNOSIS — G4733 Obstructive sleep apnea (adult) (pediatric): Secondary | ICD-10-CM | POA: Diagnosis not present

## 2023-01-15 DIAGNOSIS — I130 Hypertensive heart and chronic kidney disease with heart failure and stage 1 through stage 4 chronic kidney disease, or unspecified chronic kidney disease: Secondary | ICD-10-CM | POA: Insufficient documentation

## 2023-01-15 DIAGNOSIS — Z794 Long term (current) use of insulin: Secondary | ICD-10-CM | POA: Insufficient documentation

## 2023-01-15 DIAGNOSIS — G4730 Sleep apnea, unspecified: Secondary | ICD-10-CM | POA: Diagnosis not present

## 2023-01-15 DIAGNOSIS — E11621 Type 2 diabetes mellitus with foot ulcer: Secondary | ICD-10-CM | POA: Diagnosis not present

## 2023-01-15 DIAGNOSIS — L97522 Non-pressure chronic ulcer of other part of left foot with fat layer exposed: Secondary | ICD-10-CM

## 2023-01-15 DIAGNOSIS — I2510 Atherosclerotic heart disease of native coronary artery without angina pectoris: Secondary | ICD-10-CM | POA: Insufficient documentation

## 2023-01-16 DIAGNOSIS — L97522 Non-pressure chronic ulcer of other part of left foot with fat layer exposed: Secondary | ICD-10-CM | POA: Diagnosis not present

## 2023-01-16 NOTE — Progress Notes (Signed)
ANTRONE, WALLA ([id number]) [phone] [id number] Page 1 of 5 Visit Report for 01/15/2023 Abuse Risk Screen Details Patient Name: Date of Service: [name] [place], [place] RO LD 01/15/2023 10:30 A M Medical Record Number: [id number] Patient Account Number: [id number] Date of Birth/Sex: Treating RN: 1971/01/05 (52 y.o. Male) Cornell Barman Primary Care Kern Gingras: Asencion Noble Other Clinician: Referring Yoltzin Barg: Treating Shayma Pfefferle/Extender: Tonita Cong in Treatment: 0 Abuse Risk Screen Items Answer ABUSE RISK SCREEN: Has anyone close to you tried to hurt or harm you recentlyo No Do you feel uncomfortable with anyone in your familyo No Has anyone forced you do things that you didnt want to doo No Electronic Signature(s) Signed: 01/15/2023 11:43:47 PM By: Gretta Cool, BSN, RN, CWS, Kim RN, BSN Entered By: Gretta Cool, BSN, RN, CWS, Kim on 01/15/2023 10:35:15 -------------------------------------------------------------------------------- Activities of Daily Living Details Patient Name: Date of Service: [name] [place], [place] RO LD 01/15/2023 10:30 A M Medical Record Number: [id number] Patient Account Number: [id number] Date of Birth/Sex: Treating RN: 1971-10-11 (51 y.o. Male) Cornell Barman Primary Care Jnyah Brazee: Asencion Noble Other Clinician: Referring Floreen Teegarden: Treating Sung Renton/Extender: Tonita Cong in Treatment: 0 Activities of Daily Living Items Answer Activities of Daily Living (Please select one for each item) Drive Automobile Completely Able T Medications ake Completely Able Use T elephone Completely Able Care for Appearance Completely Able Use T oilet Completely Able Bath / Shower Completely Able Dress Self Completely Able Feed Self Completely Able Walk Completely Able Get In / Out Bed Completely Able Housework Completely [name], [name] ([id number]) [id number] Page 2 of  5 Prepare Meals Completely Able Handle Money Completely Able Shop for Self Completely Able Electronic Signature(s) Signed: 01/15/2023 11:43:47 PM By: Gretta Cool, BSN, RN, CWS, Kim RN, BSN Entered By: Gretta Cool, BSN, RN, CWS, Kim on 01/15/2023 10:35:25 -------------------------------------------------------------------------------- Education Screening Details Patient Name: Date of Service: [name], [name] 01/15/2023 10:30 A M Medical Record Number: [id number] Patient Account Number: [id number] Date of Birth/Sex: Treating RN: 03-28-1971 (51 y.o. Male) Cornell Barman Primary Care Mauriana Dann: Asencion Noble Other Clinician: Referring Candelaria Pies: Treating Takyah Ciaramitaro/Extender: Tonita Cong in Treatment: 0 Primary Learner Assessed: Patient Learning Preferences/Education Level/Primary Language Learning Preference: Explanation, Demonstration Highest Education Level: College or Above Preferred Language: English Cognitive Barrier Language Barrier: No Translator Needed: No Memory Deficit: No Emotional Barrier: No Physical Barrier Impaired Vision: No Impaired Hearing: No Decreased Hand dexterity: No Knowledge/Comprehension Knowledge Level: High Comprehension Level: High Ability to understand written instructions: High Ability to understand verbal instructions: High Motivation Anxiety Level: Calm Cooperation: Cooperative Education Importance: Acknowledges Need Interest in Health Problems: Asks Questions Perception: Coherent Willingness to Engage in Self-Management High Activities: Readiness to Engage in Self-Management High Activities: Electronic Signature(s) Signed: 01/15/2023 11:43:47 PM By: Gretta Cool, BSN, RN, CWS, Kim RN, BSN Entered By: Gretta Cool, BSN, RN, CWS, Kim on 01/15/2023 10:35:46 [name] ([id number]) [id number] Page 3 of 5 -------------------------------------------------------------------------------- Fall Risk  Assessment Details Patient Name: Date of Service: [name] [place], [place] RO LD 01/15/2023 10:30 A M Medical Record Number: [id number] Patient Account Number: [id number] Date of Birth/Sex: Treating RN: 02/21/1971 (51 y.o. Male) Cornell Barman Primary Care Floreen Teegarden: Asencion Noble Other Clinician: Referring Yahmir Sokolov: Treating Kreston Ahrendt/Extender: Tonita Cong in Treatment: 0 Fall Risk Assessment Items Have you had 2 or more falls in the last 12 monthso 0 No Have you had any fall that resulted in injury in the last 12 monthso 0 No FALLS RISK SCREEN History of falling - immediate or  within 3 months 0 No Secondary diagnosis (Do you have 2 or more medical diagnoseso) 0 No Ambulatory aid None/bed rest/wheelchair/nurse 0 Yes Crutches/cane/walker 0 No Furniture 0 No Intravenous therapy Access/Saline/Heparin Lock 0 No Gait/Transferring Normal/ bed rest/ wheelchair 0 Yes Weak (short steps with or without shuffle, stooped but able to lift head while walking, may seek 0 No support from furniture) Impaired (short steps with shuffle, may have difficulty arising from chair, head down, impaired 0 No balance) Mental Status Oriented to own ability 0 Yes Electronic Signature(s) Signed: 01/15/2023 11:43:47 PM By: Gretta Cool, BSN, RN, CWS, Kim RN, BSN Entered By: Gretta Cool, BSN, RN, CWS, Kim on 01/15/2023 10:36:00 -------------------------------------------------------------------------------- Foot Assessment Details Patient Name: Date of Service: [name], [name] 01/15/2023 10:30 A M Medical Record Number: [id number] Patient Account Number: [id number] Date of Birth/Sex: Treating RN: 1971-03-11 (51 y.o. Male) Cornell Barman Primary Care Janie Capp: Asencion Noble Other Clinician: Referring Niya Behler: Treating Manuella Blackson/Extender: Tonita Cong in Treatment: 0 Foot Assessment Items Site Locations [place], [place] ([id number]) [phone]  [id number] Page 4 of 5 + = Sensation present, - = Sensation absent, C = Callus, U = Ulcer R = Redness, W = Warmth, M = Maceration, PU = Pre-ulcerative lesion F = Fissure, S = Swelling, D = Dryness Assessment Right: Left: Other Deformity: No No Prior Foot Ulcer: No No Prior Amputation: No Yes Charcot Joint: No No Ambulatory Status: Ambulatory Without Help Gait: Steady Electronic Signature(s) Signed: 01/15/2023 11:43:47 PM By: Gretta Cool, BSN, RN, CWS, Kim RN, BSN Entered By: Gretta Cool, BSN, RN, CWS, Kim on 01/15/2023 10:49:34 -------------------------------------------------------------------------------- Nutrition Risk Screening Details Patient Name: Date of Service: [name], [name] 01/15/2023 10:30 A M Medical Record Number: [id number] Patient Account Number: [id number] Date of Birth/Sex: Treating RN: 27-Oct-1971 (51 y.o. Male) Cornell Barman Primary Care Tayven Renteria: Asencion Noble Other Clinician: Referring Crystalynn Mcinerney: Treating Jakirah Zaun/Extender: Tonita Cong in Treatment: 0 Height (in): 78 Weight (lbs): 281 Body Mass Index (BMI): 32.5 Nutrition Risk Screening Items Score Screening NUTRITION RISK SCREEN: I have an illness or condition that made me change the kind and/or amount of food I eat 0 No I eat fewer than two meals per day 0 No I eat few fruits and vegetables, or milk products 0 No I have three or more drinks of beer, liquor or wine almost every day 0 No I have tooth or mouth problems that make it hard for me to eat 0 No I don't always have enough money to buy the food I need 0 No [name], [name] ([id number]) [id number] Page 5 of 5 I eat alone most of the time 0 No I take three or more different prescribed or over-the-counter drugs a day 1 Yes Without wanting to, I have lost or gained 10 pounds in the last six months 0 No I am not always physically able to shop, cook and/or feed myself 0 No Nutrition  Protocols Good Risk Protocol 0 No interventions needed Moderate Risk Protocol High Risk Proctocol Risk Level: Good Risk Score: 1 Electronic Signature(s) Signed: 01/15/2023 11:43:47 PM By: Gretta Cool, BSN, RN, CWS, Kim RN, BSN Entered By: Gretta Cool, BSN, RN, CWS, Kim on 01/15/2023 10:36:16

## 2023-01-16 NOTE — Progress Notes (Signed)
ERIS, BRECK ([id number]) [id number] Page 1 of 8 Visit Report for 01/15/2023 Chief Complaint Document Details Patient Name: Date of Service: [name] [place], [place] RO LD 01/15/2023 10:30 A M Medical Record Number: [id number] Patient Account Number: [id number] Date of Birth/Sex: Treating RN: 05-Oct-1971 (52 y.o. Male) Cornell Barman Primary Care Provider: Asencion Noble Other Clinician: Referring Provider: Treating Provider/Extender: Tonita Cong in Treatment: 0 Information Obtained from: Patient Chief Complaint 01/15/2023; left foot wound Electronic Signature(s) Signed: 01/15/2023 12:38:47 PM By: Kalman Shan DO Entered By: Kalman Shan on 01/15/2023 12:27:46 -------------------------------------------------------------------------------- Debridement Details Patient Name: Date of Service: [name], [name] 01/15/2023 10:30 A M Medical Record Number: [id number] Patient Account Number: [id number] Date of Birth/Sex: Treating RN: [birth year] (51 y.o. Male) Cornell Barman Primary Care Provider: Asencion Noble Other Clinician: Referring Provider: Treating Provider/Extender: Tonita Cong in Treatment: 0 Debridement Performed for Assessment: Wound #1 Left,Lateral,Plantar Foot Performed By: Physician Kalman Shan, MD Debridement Type: Chemical/Enzymatic/Mechanical Agent Used: saline and [name] Severity of Tissue Pre Debridement: Fat layer exposed Level of Consciousness (Pre-procedure): Awake and Alert Pre-procedure Verification/Time Out Yes - 11:26 Taken: Instrument: Other : saline and [name] Bleeding: None Response to Treatment: Procedure was tolerated well Level of Consciousness (Post- Awake and Alert procedure): Post Debridement Measurements of Total Wound Length: (cm) 2 Width: (cm) 1.2 Depth: (cm) 0.2 TREA, LATNER ([id number]) [id number] Page 2 of  8 Volume: (cm) 0.377 Character of Wound/Ulcer Post Debridement: Stable Severity of Tissue Post Debridement: Fat layer exposed Post Procedure Diagnosis Same as Pre-procedure Electronic Signature(s) Signed: 01/15/2023 12:38:47 PM By: Kalman Shan DO Signed: 01/15/2023 11:43:47 PM By: Gretta Cool, BSN, RN, CWS, Kim RN, BSN Entered By: Gretta Cool, BSN, RN, CWS, Kim on 01/15/2023 11:27:26 -------------------------------------------------------------------------------- HPI Details Patient Name: Date of Service: [name], [name] 01/15/2023 10:30 A M Medical Record Number: [id number] Patient Account Number: [id number] Date of Birth/Sex: Treating RN: 11-Jan-1971 (51 y.o. Male) Cornell Barman Primary Care Provider: Asencion Noble Other Clinician: Referring Provider: Treating Provider/Extender: Tonita Cong in Treatment: 0 History of Present Illness HPI Description: 01/15/2023 [name] is a 52 year old male with a past medical history of uncontrolled insulin-dependent type 2 diabetes with self-reported last hemoglobin A1c of 12, previous amputations to his feet bilaterally secondary to osteomyelitis, CAD and chronic combined systolic and diastolic heart failure that presents to the clinic for a 24-month history of nonhealing ulcer to the left lateral foot. He has been following for podiatry for several months for this issue. He is currently using wet-to-dry saline dressings. He currently denies signs of infection. Electronic Signature(s) Signed: 01/15/2023 12:38:47 PM By: Kalman Shan DO Entered By: Kalman Shan on 01/15/2023 12:29:33 -------------------------------------------------------------------------------- Physical Exam Details Patient Name: Date of Service: [name], [name] 01/15/2023 10:30 A M Medical Record Number: [id number] Patient Account Number: [id number] Date of Birth/Sex: Treating RN: Sep 11, 1971 (51 y.o. Male) Cornell Barman Primary  Care Provider: Asencion Noble Other Clinician: Referring Provider: Treating Provider/Extender: Tonita Cong in Treatment: 0 Glenn, [name] ([id number]) [id number] Page 3 of 8 Cardiovascular . Psychiatric . Notes T the lateral aspect of the left foot there is an open wound with granulation tissue. Not all tissue attached to the edges. Slight undermining to the superior and o inferior portion. No signs of surrounding infection. Electronic Signature(s) Signed: 01/15/2023 12:38:47 PM By: Kalman Shan DO Entered By: Kalman Shan on 01/15/2023 12:30:21 -------------------------------------------------------------------------------- Physician Orders Details Patient Name: Date of Service: FO  [place], [name] 01/15/2023 10:30 A M Medical Record Number: [id number] Patient Account Number: [id number] Date of Birth/Sex: Treating RN: January 14, 1971 (51 y.o. Male) Huel Coventry Primary Care Provider: [name] Other Clinician: Referring Provider: Treating Provider/Extender: Rogelia Rohrer in Treatment: 0 Verbal / Phone Orders: No Diagnosis Coding ICD-10 Coding Code Description [phone] 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.621 Type 2 diabetes mellitus with foot ulcer Follow-up Appointments Return Appointment in 1 week. Bathing/ Shower/ Hygiene May shower; gently cleanse wound with antibacterial soap, rinse and pat dry prior to dressing wounds Wound Treatment Wound #1 - Foot Wound Laterality: Plantar, Left, Lateral Cleanser: Byram Ancillary Kit - 15 Day Supply 3 x Per Week/15 Days Discharge Instructions: Use supplies as instructed; Kit contains: (15) Saline Bullets; (15) 3x3 [name]; 15 pr Gloves Cleanser: Soap and Water 3 x Per Week/15 Days Discharge Instructions: Gently cleanse wound with antibacterial soap, rinse and pat dry prior  to dressing wounds Prim Dressing: Honey: Activon Honey Gel, 25 (g) Tube (Generic) 3 x Per Week/15 Days ary Secondary Dressing: Hydrofera Blue Ready Transfer Foam, 2.5x2.5 (in/in) (DME) (Generic) 3 x Per Week/15 Days Discharge Instructions: Apply to wound bed over non-stick dressing. Secured With: ACE WRAP - 66M ACE Elastic Bandage With VELCRO Brand Closure, 4 (in) 3 x Per Week/15 Days Consults Vascular - ABI, TBI, utrasound Endocrinology - [facility] Endocrinology Electronic Signature(s) MCKALE, HAFFEY ([id number]) [id number] Page 4 of 8 Signed: 01/15/2023 2:29:30 PM By: Elliot Gurney, BSN, RN, CWS, Kim RN, BSN Signed: 01/15/2023 3:25:34 PM By: Geralyn Corwin DO Previous Signature: 01/15/2023 12:38:47 PM Version By: Geralyn Corwin DO Entered By: Elliot Gurney BSN, RN, CWS, Kim on 01/15/2023 14:29:29 -------------------------------------------------------------------------------- Problem List Details Patient Name: Date of Service: Bruce [place], [name] 01/15/2023 10:30 A M Medical Record Number: [id number] Patient Account Number: [id number] Date of Birth/Sex: Treating RN: 1971/08/21 (51 y.o. Male) Huel Coventry Primary Care Provider: [name] Other Clinician: Referring Provider: Treating Provider/Extender: Rogelia Rohrer in Treatment: 0 Active Problems ICD-10 Encounter Code Description Active Date MDM Diagnosis [phone] Non-pressure chronic ulcer of other part of left foot with fat layer exposed 01/15/2023 No Yes E11.621 Type 2 diabetes mellitus with foot ulcer 01/15/2023 No Yes Z89.422 Acquired absence of other left toe(s) 01/15/2023 No Yes Inactive Problems Resolved Problems Electronic Signature(s) Signed: 01/15/2023 12:38:47 PM By: Geralyn Corwin DO Entered By: Geralyn Corwin on 01/15/2023 12:27:06 -------------------------------------------------------------------------------- Progress Note Details Patient Name: Date of  Service: Bruce [place], [name] 01/15/2023 10:30 A M Medical Record Number: [id number] Patient Account Number: [id number] Date of Birth/Sex: Treating RN: November 08, 1971 (51 y.o. Male) Huel Coventry Primary Care Provider: [name] Other Clinician: Referring Provider: Treating Provider/Extender: Federico Flake [place], [name] ([id number]) [phone].pdf Page 5 of 8 Weeks in Treatment: 0 Subjective Chief Complaint Information obtained from Patient 01/15/2023; left foot wound History of Present Illness (HPI) 01/15/2023 Mr. Corrin Hingle is a 52 year old male with a past medical history of uncontrolled insulin-dependent type 2 diabetes with self-reported last hemoglobin A1c of 12, previous amputations to his feet bilaterally secondary to osteomyelitis, CAD and chronic combined systolic and diastolic heart failure that presents to the clinic for a 96-month history of nonhealing ulcer to the left lateral foot. He has been following for podiatry for several months for this issue. He is currently using wet-to-dry saline dressings. He currently denies signs of infection. Patient History Allergies Jardiance (Reaction: itching) Social History Never smoker, Marital Status -  Single, Alcohol Use - Rarely, Drug Use - No History, Caffeine Use - Daily. Medical History Respiratory Patient has history of Sleep Apnea - C-pap does not use. Cardiovascular Patient has history of Congestive Heart Failure, Coronary Artery Disease, Hypertension, Myocardial Infarction Endocrine Patient has history of Type II Diabetes Neurologic Denies history of Neuropathy Patient is treated with Insulin. Blood sugar is not tested. Medical A Surgical History Notes nd Genitourinary CKD Review of Systems (ROS) Hematologic/Lymphatic Denies complaints or symptoms of Bleeding / Clotting Disorders, Human Immunodeficiency Virus. Cardiovascular Denies complaints or symptoms of  Chest pain, LE edema. Gastrointestinal Denies complaints or symptoms of Frequent diarrhea, Nausea, Vomiting. Genitourinary Denies complaints or symptoms of Kidney failure/ Dialysis, Incontinence/dribbling. Immunological Denies complaints or symptoms of Hives, Itching. Integumentary (Skin) Complains or has symptoms of Wounds. Neurologic Denies complaints or symptoms of Numbness/parasthesias, Focal/Weakness. Psychiatric Denies complaints or symptoms of Anxiety, Claustrophobia. Objective Constitutional Vitals Time Taken: 10:29 AM, Height: 78 in, Weight: 281 lbs, Source: Measured, BMI: 32.5, Temperature: 97.7 F, Pulse: 102 bpm, Respiratory Rate: 16 breaths/min, Blood Pressure: 149/101 mmHg. General Notes: T the lateral aspect of the left foot there is an open wound with granulation tissue. Not all tissue attached to the edges. Slight undermining to o the superior and inferior portion. No signs of surrounding infection. Integumentary (Hair, Skin) Wound #1 status is Open. Original cause of wound was Gradually Appeared. The date acquired was: 10/15/2022. The wound is located on the Left,Lateral,Plantar Foot. The wound measures 2cm length x 1.2cm width x 0.2cm depth; 1.885cm^2 area and 0.377cm^3 volume. There is Fat Layer (Subcutaneous Tissue) exposed. There is no tunneling noted. There is a medium amount of serous drainage noted. The wound margin is flat and intact. There is medium (34-66%) pink granulation within the wound bed. There is a medium (34-66%) amount of necrotic tissue within the wound bed including Adherent Slough. Assessment PETAR, MUCCI ([id number]) [id number] Page 6 of 8 Active Problems ICD-10 Non-pressure chronic ulcer of other part of left foot with fat layer exposed Type 2 diabetes mellitus with foot ulcer Acquired absence of other left toe(s) Patient presents with a 43-month history of nonhealing ulcer to the left foot secondary to  diabetes and inability to offload well the area. He has had multiple debridements in the past to his feet bilaterally. He has had resection of the fourth left met head in the fifth toe Secondary to osteomyelitis. We discussed the importance of glycemic control for wound healing. Due to his blood glucose levels being elevated he is at high risk for infection and thus further amputation. He expressed understanding. He states he is supposed to be referred to an endocrinologist at [facility] however the referral fell through. Unclear what happened. Offered a referral to endocrinology at [facility]. Patient was agreeable.Furthermore we discussed the importance of aggressive offloading for his wound healing. This will be the most difficult part of the treatment plan for the patient to do. We discussed a total contact cast however he has declined that at this time. 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 He is currently using a surgical shoe with a peg assist. It does not fit well so we will give him a new one today. I do not think this is enough offloading. If he is not able to offload this area he will likely end up with a BKA. He is well aware of this. For  now I recommended Medihoney and Hydrofera Blue for dressing changes. He will follow-up in our [place] office Since this is closer for him. Procedures Wound #1 Pre-procedure diagnosis of Wound #1 is a Diabetic Wound/Ulcer of the Lower Extremity located on the Left,Lateral,Plantar Foot .Severity of Tissue Pre Debridement is: Fat layer exposed. There was a Chemical/Enzymatic/Mechanical debridement performed by Kalman Shan, MD. With the following instrument(s): saline and [name]. Other agent used was saline and [name]. A time out was conducted at 11:26, prior to the start of the procedure. There was no bleeding. The procedure was tolerated well. Post Debridement Measurements: 2cm length  x 1.2cm width x 0.2cm depth; 0.377cm^3 volume. Character of Wound/Ulcer Post Debridement is stable. Severity of Tissue Post Debridement is: Fat layer exposed. Post procedure Diagnosis Wound #1: Same as Pre-Procedure Plan Follow-up Appointments: Return Appointment in 1 week. Bathing/ Shower/ Hygiene: May shower; gently cleanse wound with antibacterial soap, rinse and pat dry prior to dressing wounds Consults ordered were: Vascular - ABI, TBI, utrasound, Endocrinology - Dr. Renato Shin, endocrinology WOUND #1: - Foot Wound Laterality: Plantar, Left, Lateral Cleanser: Byram Ancillary Kit - 15 Day Supply 3 x Per Week/15 Days Discharge Instructions: Use supplies as instructed; Kit contains: (15) Saline Bullets; (15) 3x3 [name]; 15 pr Gloves Cleanser: Soap and Water 3 x Per Week/15 Days Discharge Instructions: Gently cleanse wound with antibacterial soap, rinse and pat dry prior to dressing wounds Prim Dressing: Honey: Activon Honey Gel, 25 (g) Tube (Generic) 3 x Per Week/15 Days ary Secondary Dressing: Hydrofera Blue Ready Transfer Foam, 2.5x2.5 (in/in) (DME) (Generic) 3 x Per Week/15 Days Discharge Instructions: Apply to wound bed over non-stick dressing. Secured With: ACE WRAP - 84M ACE Elastic Bandage With VELCRO Brand Closure, 4 (in) 3 x Per Week/15 Days 1. Medihoney and Hydrofera Blue 2. Aggressive offloadingoosurgical shoe and peg assist 3. Referral to endocrinologyooDr. Loanne Drilling 4. Follow-up in 1 week Electronic Signature(s) Signed: 01/15/2023 12:38:47 PM By: Kalman Shan DO Entered By: Kalman Shan on 01/15/2023 12:38:08 [name] ([id number]) [id number] Page 7 of 8 -------------------------------------------------------------------------------- ROS/PFSH Details Patient Name: Date of Service: [name] [place], [place] RO LD 01/15/2023 10:30 A M Medical Record Number: [id number] Patient Account Number: [id number] Date of Birth/Sex: Treating  RN: [birth year] (52 y.o. Male) Cornell Barman Primary Care Provider: Asencion Noble Other Clinician: Referring Provider: Treating Provider/Extender: Tonita Cong in Treatment: 0 Hematologic/Lymphatic Complaints and Symptoms: Negative for: Bleeding / Clotting Disorders; Human Immunodeficiency Virus Cardiovascular Complaints and Symptoms: Negative for: Chest pain; LE edema Medical History: Positive for: Congestive Heart Failure; Coronary Artery Disease; Hypertension; Myocardial Infarction Gastrointestinal Complaints and Symptoms: Negative for: Frequent diarrhea; Nausea; Vomiting Genitourinary Complaints and Symptoms: Negative for: Kidney failure/ Dialysis; Incontinence/dribbling Medical History: Past Medical History Notes: CKD Immunological Complaints and Symptoms: Negative for: Hives; Itching Integumentary (Skin) Complaints and Symptoms: Positive for: Wounds Neurologic Complaints and Symptoms: Negative for: Numbness/parasthesias; Focal/Weakness Medical History: Negative for: Neuropathy Psychiatric Complaints and Symptoms: Negative for: Anxiety; Claustrophobia Respiratory Medical History: Positive for: Sleep Apnea - C-pap does not use. Endocrine Medical History: Positive for: Type II Diabetes Time with diabetes: 10 years Treated with: Insulin Blood sugar tested every day: No Oncologic Immunizations Pneumococcal Vaccine: Received Pneumococcal VaccinationMARY, HOCKEY ([id number]) [id number] Page 8 of 8 Received Pneumococcal Vaccination On or After 60th Birthday: No Implantable Devices No devices added Family and Social History Never smoker; Marital Status - Single; Alcohol Use: Rarely; Drug Use: No History; Caffeine Use: Daily Electronic Signature(s) Signed: 01/15/2023 12:38:47 PM By: [place],  Shanda Bumps DO Signed: 01/15/2023 11:43:47 PM By: Elliot Gurney, BSN, RN, CWS, Kim RN, BSN Entered By: Elliot Gurney, BSN, RN,  CWS, Kim on 01/15/2023 10:35:06 -------------------------------------------------------------------------------- Conley Rolls Details Patient Name: Date of Service: Bruce [place], [name] RO LD 01/15/2023 Medical Record Number: [id number] Patient Account Number: [id number] Date of Birth/Sex: Treating RN: 09-Mar-1971 (51 y.o. Male) Huel Coventry Primary Care Provider: [name] Other Clinician: Referring Provider: Treating Provider/Extender: Rogelia Rohrer in Treatment: 0 Diagnosis Coding ICD-10 Codes Code Description [phone] 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.621 Type 2 diabetes mellitus with foot ulcer Z89.422 Acquired absence of other left toe(s) Facility Procedures : CPT4 Code: [id number] Description: 25525 - DEBRIDE W/O ANES NON SELECT Modifier: Quantity: 1 Physician Procedures : CPT4 Code Description Modifier [id number] - WC PHYS LEVEL 4 - EST PT ICD-10 Diagnosis Description L97.522 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.621 Type 2 diabetes mellitus with foot ulcer Z89.422 Acquired  absence of other left toe(s) Quantity: 1 Electronic Signature(s) Signed: 01/15/2023 12:38:47 PM By: Geralyn Corwin DO Entered By: Geralyn Corwin on 01/15/2023 12:38:18

## 2023-01-16 NOTE — Progress Notes (Addendum)
TOAN, MORT ([id number]) [id number] Page 1 of 8 Visit Report for 01/15/2023 Allergy List Details Patient Name: Date of Service: [name] [place], [place] RO LD 01/15/2023 10:30 A M Medical Record Number: [id number] Patient Account Number: [id number] Date of Birth/Sex: Treating RN: 1971-02-15 (51 y.o. Male) Cornell Barman Primary Care Emidio Warrell: Asencion Noble Other Clinician: Referring Muriel Wilber: Treating Scorpio Fortin/Extender: Tonita Cong in Treatment: 0 Allergies Active Allergies Jardiance Reaction: itching Allergy Notes Electronic Signature(s) Signed: 01/15/2023 11:43:47 PM By: Gretta Cool, BSN, RN, CWS, Kim RN, BSN Entered By: Gretta Cool, BSN, RN, CWS, Kim on 01/15/2023 10:30:27 -------------------------------------------------------------------------------- Arrival Information Details Patient Name: Date of Service: [name] [place], [place] RO LD 01/15/2023 10:30 A M Medical Record Number: [id number] Patient Account Number: [id number] Date of Birth/Sex: Treating RN: February 09, 1971 (51 y.o. Male) Cornell Barman Primary Care Stefan Markarian: Asencion Noble Other Clinician: Referring Trinia Georgi: Treating Treyvonne Tata/Extender: Tonita Cong in Treatment: 0 Visit Information Patient Arrived: Ambulatory Arrival Time: 10:24 Transfer Assistance: None Patient Identification Verified: Yes Secondary Verification Process Completed: Yes Patient Requires Transmission-Based Precautions: No Patient Has Alerts: Yes Patient Alerts: Type II Diabetic Electronic Signature(s) Signed: 01/15/2023 11:43:47 PM By: Gretta Cool, BSN, RN, CWS, Kim RN, BSN Entered By: Gretta Cool, BSN, RN, CWS, Kim on 01/15/2023 10:29:09 [name] ([id number]) [id number] Page 2 of 8 -------------------------------------------------------------------------------- Clinic Level of Care Assessment Details Patient Name: Date of Service: [name] [place], [place] RO LD 01/15/2023  10:30 A M Medical Record Number: [id number] Patient Account Number: [id number] Date of Birth/Sex: Treating RN: 1971/12/16 (51 y.o. Male) Cornell Barman Primary Care Karina Lenderman: Asencion Noble Other Clinician: Referring Eran Mistry: Treating Tru Leopard/Extender: Tonita Cong in Treatment: 0 Clinic Level of Care Assessment Items TOOL 1 Quantity Score []  - 0 Use when EandM and Procedure is performed on INITIAL visit ASSESSMENTS - Nursing Assessment / Reassessment X- 1 20 General Physical Exam (combine w/ comprehensive assessment (listed just below) when performed on new pt. evals) X- 1 25 Comprehensive Assessment (HX, ROS, Risk Assessments, Wounds Hx, etc.) ASSESSMENTS - Wound and Skin Assessment / Reassessment []  - 0 Dermatologic / Skin Assessment (not related to wound area) ASSESSMENTS - Ostomy and/or Continence Assessment and Care []  - 0 Incontinence Assessment and Management []  - 0 Ostomy Care Assessment and Management (repouching, etc.) PROCESS - Coordination of Care X - Simple Patient / Family Education for ongoing care 1 15 []  - 0 Complex (extensive) Patient / Family Education for ongoing care X- 1 10 Staff obtains Programmer, systems, Records, T Results / Process Orders est []  - 0 Staff telephones HHA, Nursing Homes / Clarify orders / etc []  - 0 Routine Transfer to another Facility (non-emergent condition) []  - 0 Routine Hospital Admission (non-emergent condition) X- 1 15 New Admissions / Biomedical engineer / Ordering NPWT Apligraf, etc. , []  - 0 Emergency Hospital Admission (emergent condition) PROCESS - Special Needs []  - 0 Pediatric / Minor Patient Management []  - 0 Isolation Patient Management []  - 0 Hearing / Language / Visual special needs []  - 0 Assessment of Community assistance (transportation, D/C planning, etc.) []  - 0 Additional assistance / Altered mentation []  - 0 Support Surface(s) Assessment (bed, cushion, seat,  etc.) INTERVENTIONS - Miscellaneous []  - 0 External ear exam []  - 0 Patient Transfer (multiple staff / Civil Service fast streamer / Similar devices) []  - 0 Simple Staple / Suture removal (25 or less) []  - 0 Complex Staple / Suture removal (26 or more) [name], [name] ([id number]) [phone].pdf Page 3 of 8 []  - 0 Hypo/Hyperglycemic Management (do  not check if billed separately) []  - 0 Ankle / Brachial Index (ABI) - do not check if billed separately Has the patient been seen at the hospital within the last three years: Yes Total Score: 85 Level Of Care: New/Established - Level 3 Electronic Signature(s) Signed: 01/16/2023 6:11:46 PM By: Gretta Cool, BSN, RN, CWS, Kim RN, BSN Entered By: Gretta Cool, BSN, RN, CWS, Kim on 01/16/2023 12:20:58 -------------------------------------------------------------------------------- Encounter Discharge Information Details Patient Name: Date of Service: [name] [place], [name] 01/15/2023 10:30 A M Medical Record Number: [id number] Patient Account Number: [id number] Date of Birth/Sex: Treating RN: 29-Sep-1971 (51 y.o. Male) Cornell Barman Primary Care Elma Limas: Asencion Noble Other Clinician: Referring Jamyria Ozanich: Treating Kamber Vignola/Extender: Tonita Cong in Treatment: 0 Encounter Discharge Information Items Post Procedure Vitals Discharge Condition: Stable Unable to obtain vitals Reason: limited time Ambulatory Status: Ambulatory Discharge Destination: Home Transportation: Private Auto Schedule Follow-up Appointment: Yes Clinical Summary of Care: Electronic Signature(s) Signed: 01/16/2023 12:12:11 PM By: Gretta Cool, BSN, RN, CWS, Kim RN, BSN Entered By: Gretta Cool, BSN, RN, CWS, Kim on 01/16/2023 12:12:10 -------------------------------------------------------------------------------- Lower Extremity Assessment Details Patient Name: Date of Service: [name] [place], [name] 01/15/2023 10:30 A M Medical Record Number: [id number] Patient  Account Number: [id number] Date of Birth/Sex: Treating RN: 05-09-1971 (51 y.o. Male) Cornell Barman Primary Care Aveline Daus: Asencion Noble Other Clinician: Referring Rebecah Dangerfield: Treating Alsie Younes/Extender: Tonita Cong in Treatment: 0 Edema Assessment Assessed: Shirlyn Goltz: Yes] [Right: No] Edema: [Left: N] [Right: o] Vascular Assessment Left: [[id number].pdf Page 4 of 8Right:] Pulses: Dorsalis Pedis Palpable: [[id number].pdf Page 4 of 8Yes] Doppler Audible: [[id number].pdf Page 4 of 8Yes] Posterior Tibial Palpable: [[id number].pdf Page 4 of 8Yes] Doppler Audible: [[id number].pdf Page 4 of 8Yes] Blood Pressure: Brachial: [[id number].pdf Page 4 of 8130] Dorsalis Pedis: 210 Ankle: [[id number].pdf Page 4 of 8Posterior Tibial: 216 1.66] Electronic Signature(s) Signed: 01/15/2023 11:43:47 PM By: Gretta Cool, BSN, RN, CWS, Kim RN, BSN Entered By: Gretta Cool, BSN, RN, CWS, Kim on 01/15/2023 10:54:41 -------------------------------------------------------------------------------- Multi Wound Chart Details Patient Name: Date of Service: [name] [place], [name] 01/15/2023 10:30 A M Medical Record Number: [id number] Patient Account Number: [id number] Date of Birth/Sex: Treating RN: July 28, 1971 (51 y.o. Male) Cornell Barman Primary Care Abdinasir Spadafore: Asencion Noble Other Clinician: Referring Kosei Rhodes: Treating Midge Momon/Extender: Tonita Cong in Treatment: 0 Vital Signs Height(in): 78 Pulse(bpm): 102 Weight(lbs): 281 Blood Pressure(mmHg): 149/101 Body Mass Index(BMI): 32.5 Temperature(F): 97.7 Respiratory Rate(breaths/min): 16 [1:Photos:] [N/A:N/A] Left, Lateral, Plantar Foot N/A N/A Wound Location: Gradually Appeared N/A N/A Wounding Event: Diabetic Wound/Ulcer of the Lower N/A N/A Primary  Etiology: Extremity Sleep Apnea, Congestive Heart N/A N/A Comorbid History: Failure, Coronary Artery Disease, Hypertension, Myocardial Infarction, Type II Diabetes 10/15/2022 N/A N/A Date Acquired: 0 N/A N/A Weeks of Treatment: Open N/A N/A Wound Status: No N/A N/A Wound Recurrence: Yes N/A N/A Pending [name] on Presentation: 2x1.2x0.2 N/A N/A Measurements L x W x D (cm) 1.885 N/A N/A A (cm) : rea 0.377 N/A N/A Volume (cm) [name] ([id number]) [id number] Page 5 of 8 Grade 2 N/A N/A Classification: Medium N/A N/A Exudate A mount: Serous N/A N/A Exudate Type: amber N/A N/A Exudate Color: Flat and Intact N/A N/A Wound Margin: Medium (34-66%) N/A N/A Granulation A mount: Pink N/A N/A Granulation Quality: Medium (34-66%) N/A N/A Necrotic A mount: Fat Layer (Subcutaneous Tissue): Yes N/A N/A Exposed Structures: Fascia: No Tendon: No Muscle: No Joint: No Bone: No None N/A N/A Epithelialization: Chemical/Enzymatic/Mechanical N/A N/A Debridement: Pre-procedure Verification/Time Out 11:26 N/A  N/A Taken: Other(saline and [name]) N/A N/A Instrument: None N/A N/A Bleeding: Procedure was tolerated well N/A N/A Debridement Treatment Response: 2x1.2x0.2 N/A N/A Post Debridement Measurements L x W x D (cm) 0.377 N/A N/A Post Debridement Volume: (cm) Debridement N/A N/A Procedures Performed: Treatment Notes Electronic Signature(s) Signed: 01/15/2023 12:38:47 PM By: Geralyn Corwin DO Entered By: Geralyn Corwin on 01/15/2023 12:27:12 -------------------------------------------------------------------------------- Pain Assessment Details Patient Name: Date of Service: [name] [place], [name] 01/15/2023 10:30 A M Medical Record Number: [id number] Patient Account Number: [id number] Date of Birth/Sex: Treating RN: 03-02-1971 (51 y.o. Male) Huel Coventry Primary Care Mardelle Pandolfi: [name] Other Clinician: Referring  Hannah Crill: Treating Frankee Gritz/Extender: Rogelia Rohrer in Treatment: 0 Active Problems Location of Pain Severity and Description of Pain Patient Has Paino No Site Locations Home, [place] ([id number]) [phone].pdf Page 6 of 8 Pain Management and Medication Current Pain Management: Notes Patient denies pain at this time Electronic Signature(s) Signed: 01/15/2023 11:43:47 PM By: Elliot Gurney, BSN, RN, CWS, Kim RN, BSN Entered By: Elliot Gurney, BSN, RN, CWS, Kim on 01/15/2023 25:85:27 -------------------------------------------------------------------------------- Patient/Caregiver Education Details Patient Name: Date of Service: [name] [place], [name] 1/17/2024andnbsp10:30 A M Medical Record Number: [id number] Patient Account Number: [id number] Date of Birth/Gender: Treating RN: Dec 31, 1970 (51 y.o. Male) Huel Coventry Primary Care Physician: [name] Other Clinician: Referring Physician: Treating Physician/Extender: Rogelia Rohrer in Treatment: 0 Education Assessment Education Provided To: Patient Education Topics Provided Wound/Skin Impairment: Handouts: Caring for Your Ulcer, Other: wound care as prescribed Methods: Explain/Verbal Responses: State content correctly Electronic Signature(s) Signed: 01/16/2023 6:11:46 PM By: Elliot Gurney, BSN, RN, CWS, Kim RN, BSN Entered By: Elliot Gurney, BSN, RN, CWS, Kim on 01/16/2023 12:11:03 -------------------------------------------------------------------------------- Wound Assessment Details Patient Name: Date of Service: [name] [place], [name] 01/15/2023 10:30 A M Medical Record Number: [id number] Patient Account Number: [id number] Date of Birth/Sex: Treating RN: 1971-06-21 (51 y.o. Male) Huel Coventry Primary Care Jaimin Krupka: [name] Other Clinician: Referring Aviya Jarvie: Treating Corona Popovich/Extender: Rogelia Rohrer in Treatment: 0 [name], [name] ([id number]) [id number] Page 7 of 8 Wound Status Wound Number: 1 Primary Diabetic Wound/Ulcer of the Lower Extremity Etiology: Wound Location: Left, Lateral, Plantar Foot Wound Open Wounding Event: Gradually Appeared Status: Date Acquired: 10/15/2022 Comorbid Sleep Apnea, Congestive Heart Failure, Coronary Artery Disease, Weeks Of Treatment: 0 History: Hypertension, Myocardial Infarction, Type II Diabetes Clustered Wound: No Photos Wound Measurements Length: (cm) 2 Width: (cm) 1.2 Depth: (cm) 0.2 Area: (cm) 1.885 Volume: (cm) 0.377 % Reduction in Area: % Reduction in Volume: Epithelialization: None Tunneling: No Wound Description Classification: Grade 2 Wound Margin: Flat and Intact Exudate Amount: Medium Exudate Type: Serous Exudate Color: amber Foul Odor After Cleansing: No Slough/Fibrino Yes Wound Bed Granulation Amount: Medium (34-66%) Exposed Structure Granulation Quality: Pink Fascia Exposed: No Necrotic Amount: Medium (34-66%) Fat Layer (Subcutaneous Tissue) Exposed: Yes Necrotic Quality: Adherent Slough Tendon Exposed: No Muscle Exposed: No Joint Exposed: No Bone Exposed: No Electronic Signature(s) Signed: 01/15/2023 11:43:47 PM By: Elliot Gurney, BSN, RN, CWS, Kim RN, BSN Entered By: Elliot Gurney, BSN, RN, CWS, Kim on 01/15/2023 10:53:59 -------------------------------------------------------------------------------- Vitals Details Patient Name: Date of Service: [name] [place], [name] 01/15/2023 10:30 A M Medical Record Number: [id number] Patient Account Number: [id number] Date of Birth/Sex: Treating RN: 02/09/1971 (51 y.o. Male) Huel Coventry Primary Care Christene Pounds: [name] Other Clinician: Referring Lannie Yusuf: Treating Mahlia Fernando/Extender: Rogelia Rohrer in Treatment: 0 Vital Signs [name] ([id number]) [id number].pdf Page 8 of 8 Time Taken: 10:29 Temperature (F):  97.7 Height (in): 78 Pulse (bpm): 102 Weight (lbs): 281 Respiratory Rate (breaths/min): 16 Source: Measured Blood Pressure (mmHg): 149/101 Body Mass Index (BMI): 32.5 Reference Range: 80 - 120 mg / dl Electronic Signature(s) Signed: 01/15/2023 11:43:47 PM By: Gretta Cool, BSN, RN, CWS, Kim RN, BSN Entered By: Gretta Cool, BSN, RN, CWS, Kim on 01/15/2023 10:30:06

## 2023-01-20 ENCOUNTER — Other Ambulatory Visit: Payer: Self-pay

## 2023-01-23 ENCOUNTER — Ambulatory Visit: Payer: 59 | Admitting: Podiatry

## 2023-01-24 ENCOUNTER — Encounter (HOSPITAL_BASED_OUTPATIENT_CLINIC_OR_DEPARTMENT_OTHER): Payer: 59 | Attending: Internal Medicine | Admitting: Internal Medicine

## 2023-01-24 DIAGNOSIS — I130 Hypertensive heart and chronic kidney disease with heart failure and stage 1 through stage 4 chronic kidney disease, or unspecified chronic kidney disease: Secondary | ICD-10-CM | POA: Diagnosis not present

## 2023-01-24 DIAGNOSIS — E11621 Type 2 diabetes mellitus with foot ulcer: Secondary | ICD-10-CM | POA: Insufficient documentation

## 2023-01-24 DIAGNOSIS — Y9301 Activity, walking, marching and hiking: Secondary | ICD-10-CM | POA: Diagnosis not present

## 2023-01-24 DIAGNOSIS — Z794 Long term (current) use of insulin: Secondary | ICD-10-CM | POA: Insufficient documentation

## 2023-01-24 DIAGNOSIS — Z89422 Acquired absence of other left toe(s): Secondary | ICD-10-CM | POA: Diagnosis not present

## 2023-01-24 DIAGNOSIS — N189 Chronic kidney disease, unspecified: Secondary | ICD-10-CM | POA: Insufficient documentation

## 2023-01-24 DIAGNOSIS — I5042 Chronic combined systolic (congestive) and diastolic (congestive) heart failure: Secondary | ICD-10-CM | POA: Insufficient documentation

## 2023-01-24 DIAGNOSIS — E1140 Type 2 diabetes mellitus with diabetic neuropathy, unspecified: Secondary | ICD-10-CM | POA: Insufficient documentation

## 2023-01-24 DIAGNOSIS — L97522 Non-pressure chronic ulcer of other part of left foot with fat layer exposed: Secondary | ICD-10-CM | POA: Insufficient documentation

## 2023-01-24 DIAGNOSIS — Z7984 Long term (current) use of oral hypoglycemic drugs: Secondary | ICD-10-CM | POA: Insufficient documentation

## 2023-01-24 DIAGNOSIS — E1122 Type 2 diabetes mellitus with diabetic chronic kidney disease: Secondary | ICD-10-CM | POA: Insufficient documentation

## 2023-01-24 DIAGNOSIS — I2510 Atherosclerotic heart disease of native coronary artery without angina pectoris: Secondary | ICD-10-CM | POA: Insufficient documentation

## 2023-01-24 DIAGNOSIS — Z955 Presence of coronary angioplasty implant and graft: Secondary | ICD-10-CM | POA: Diagnosis not present

## 2023-01-24 DIAGNOSIS — E785 Hyperlipidemia, unspecified: Secondary | ICD-10-CM | POA: Insufficient documentation

## 2023-01-27 ENCOUNTER — Other Ambulatory Visit: Payer: Self-pay

## 2023-01-27 NOTE — Progress Notes (Signed)
Glenn, [name] ([id number]) [id number] Page 1 of 4 Visit Report for 01/24/2023 Abuse Risk Screen Details Patient Name: Date of Service: [name], [place] RO LD 01/24/2023 9:15 A M Medical Record Number: [id number] Patient Account Number: [id number] Date of Birth/Sex: Treating RN: [birth year] (52 y.o. M) Primary Care Sesilia Poucher: Asencion [name] Other Clinician: Referring Amor Hyle: Treating [name]/Extender: Tonita Cong in Treatment: 0 Abuse Risk Screen Items Answer ABUSE RISK SCREEN: Has anyone close to you tried to hurt or harm you recentlyo No Do you feel uncomfortable with anyone in your familyo No Has anyone forced you do things that you didnt want to doo No Electronic Signature(s) Signed: 01/27/2023 4:38:40 PM By: Erenest Blank Entered By: Erenest Blank on 01/24/2023 09:20:33 -------------------------------------------------------------------------------- Activities of Daily Living Details Patient Name: Date of Service: [name], [place] RO LD 01/24/2023 9:15 A M Medical Record Number: [id number] Patient Account Number: [id number] Date of Birth/Sex: Treating RN: 21-Sep-1971 (52 y.o. M) Primary Care Pattye Meda: Asencion [name] Other Clinician: Referring Jami Bogdanski: Treating Yuka Lallier/Extender: Tonita Cong in Treatment: 0 Activities of Daily Living Items Answer Activities of Daily Living (Please select one for each item) Drive Automobile Completely Able T Medications ake Completely Able Use T elephone Completely Able Care for Appearance Completely Able Use T oilet Completely Able Bath / Shower Completely Able Dress Self Completely Able Feed Self Completely Able Walk Completely Able Get In / Out Bed Completely Able Housework Completely Able Prepare Meals Completely Able Handle Money Completely Able Shop for Self Completely Able Electronic Signature(s) Signed: 01/27/2023 4:38:40 PM  By: Erenest Blank Entered By: Erenest Blank on 01/24/2023 09:20:56 [name] ([id number]) [id number] Page 2 of 4 -------------------------------------------------------------------------------- Education Screening Details Patient Name: Date of Service: [name], [place] RO LD 01/24/2023 9:15 A M Medical Record Number: [id number] Patient Account Number: [id number] Date of Birth/Sex: Treating RN: August 06, 1971 (52 y.o. M) Primary Care Madix Blowe: Asencion [name] Other Clinician: Referring Enisa Runyan: Treating Maxime Beckner/Extender: Tonita Cong in Treatment: 0 Primary Learner Assessed: Patient Learning Preferences/Education Level/Primary Language Learning Preference: Explanation, Demonstration, Printed Material Highest Education Level: High School Preferred Language: English Cognitive Barrier Language Barrier: No Translator Needed: No Memory Deficit: No Emotional Barrier: No Cultural/Religious Beliefs Affecting Medical Care: No Physical Barrier Impaired Vision: No Impaired Hearing: No Decreased Hand dexterity: No Knowledge/Comprehension Knowledge Level: High Comprehension Level: High Ability to understand written instructions: High Ability to understand verbal instructions: High Motivation Anxiety Level: Calm Cooperation: Cooperative Education Importance: Acknowledges Need Interest in Health Problems: Asks Questions Perception: Coherent Willingness to Engage in Self-Management High Activities: Readiness to Engage in Self-Management High Activities: Electronic Signature(s) Signed: 01/27/2023 4:38:40 PM By: Erenest Blank Entered By: Erenest Blank on 01/24/2023 09:21:32 -------------------------------------------------------------------------------- Fall Risk Assessment Details Patient Name: Date of Service: Glenn Gauze [place], [name] 01/24/2023 9:15 A M Medical Record Number: [id number] Patient Account Number:  [id number] Date of Birth/Sex: Treating RN: November 15, 1971 (51 y.o. M) Primary Care Harshini Trent: Asencion [name] Other Clinician: Referring Joannie Medine: Treating Shannin Naab/Extender: Tonita Cong in Treatment: 0 Fall Risk Assessment Items Have you had 2 or more falls in the last [address] monthso 0 No [place], [name] ([id number]) [id number] Page 3 of 4 Have you had any fall that resulted in injury in the last 12 monthso 0 No FALLS RISK SCREEN History of falling - immediate or within 3 months 0 No Secondary diagnosis (Do you have 2 or more medical diagnoseso) 0 No Ambulatory aid None/bed rest/wheelchair/nurse 0 No Crutches/cane/walker 0  No Furniture 0 No Intravenous therapy Access/Saline/Heparin Lock 0 No Gait/Transferring Normal/ bed rest/ wheelchair 0 Yes Weak (short steps with or without shuffle, stooped but able to lift head while walking, may seek 0 No support from furniture) Impaired (short steps with shuffle, may have difficulty arising from chair, head down, impaired 0 No balance) Mental Status Oriented to own ability 0 Yes Electronic Signature(s) Signed: 01/27/2023 4:38:40 PM By: Erenest Blank Entered By: Erenest Blank on 01/24/2023 09:21:56 -------------------------------------------------------------------------------- Foot Assessment Details Patient Name: Date of Service: Glenn Gauze [place], [name] 01/24/2023 9:15 A M Medical Record Number: [id number] Patient Account Number: [id number] Date of Birth/Sex: Treating RN: Sep 05, 1971 (52 y.o. [name] Primary Care Ottavio Norem: Asencion [name] Other Clinician: Referring Feliciano Wynter: Treating Demorris Choyce/Extender: Tonita Cong in Treatment: 0 Foot Assessment Items Site Locations + = Sensation present, - = Sensation absent, C = Callus, U = Ulcer R = Redness, W = Warmth, M = Maceration, PU = Pre-ulcerative lesion F = Fissure, S = Swelling, D =  Dryness Assessment Right: Left: Other Deformity: No No Prior Foot Ulcer: No No Prior Amputation: No No Charcot Joint: No No Ambulatory Status: Ambulatory Without Help GaitSHARIEF, WAINWRIGHT ([id number]) [id number] Page 4 of 4 Electronic Signature(s) Signed: 01/24/2023 4:56:20 PM By: Deon Pilling RN, BSN Entered By: Deon Pilling on 01/24/2023 09:32:42 -------------------------------------------------------------------------------- Nutrition Risk Screening Details Patient Name: Date of Service: Glenn Gauze [place], [name] 01/24/2023 9:15 A M Medical Record Number: [id number] Patient Account Number: [id number] Date of Birth/Sex: Treating RN: 1971/01/12 (52 y.o. M) Primary Care Dravon Nott: Asencion [name] Other Clinician: Referring Tristina Sahagian: Treating Kharson Rasmusson/Extender: Tonita Cong in Treatment: 0 Height (in): 78 Weight (lbs): 275 Body Mass Index (BMI): 31.8 Nutrition Risk Screening Items Score Screening NUTRITION RISK SCREEN: I have an illness or condition that made me change the kind and/or amount of food I eat 0 No I eat fewer than two meals per day 0 No I eat few fruits and vegetables, or milk products 0 No I have three or more drinks of beer, liquor or wine almost every day 0 No I have tooth or mouth problems that make it hard for me to eat 0 No I don't always have enough money to buy the food I need 0 No I eat alone most of the time 0 No I take three or more different prescribed or over-the-counter drugs a day 1 Yes Without wanting to, I have lost or gained 10 pounds in the last six months 2 Yes I am not always physically able to shop, cook and/or feed myself 0 No Nutrition Protocols Good Risk Protocol Provide education on elevated blood Moderate Risk Protocol 0 sugars and impact on wound healing, as applicable High Risk Proctocol Risk Level: Moderate Risk Score: 3 Electronic Signature(s) Signed:  01/27/2023 4:38:40 PM By: Erenest Blank Entered By: Erenest Blank on 01/24/2023 09:22:46

## 2023-01-28 ENCOUNTER — Other Ambulatory Visit: Payer: Self-pay

## 2023-01-29 NOTE — Progress Notes (Signed)
[name], [name] [id number] Page 1 of 9 Visit Report for 01/24/2023 Allergy List Details Patient Name: Date of Service: [name] [place], [place] RO LD 01/24/2023 9:15 A M Medical Record Number: [id number] Patient Account Number: [id number] Date of Birth/Sex: Treating RN: [birth year] (52 y.o. M) Primary Care Camdyn Laden: Asencion Noble Other Clinician: Referring Tymeshia Awan: Treating Gerron Guidotti/Extender: Tonita Cong in Treatment: 0 Allergies Active Allergies Jardiance Reaction: itching Severity: Mild Allergy Notes Electronic Signature(s) Signed: 01/27/2023 4:38:40 PM By: Erenest Blank Entered By: Erenest Blank on 01/24/2023 09:10:04 -------------------------------------------------------------------------------- Arrival Information Details Patient Name: Date of Service: [name] [place], [name] 01/24/2023 9:15 A M Medical Record Number: [id number] Patient Account Number: [id number] Date of Birth/Sex: Treating RN: Apr 15, 1971 (52 y.o. M) Primary Care Hendrix Yurkovich: Asencion Noble Other Clinician: Referring Javia Dillow: Treating Omari Mcmanaway/Extender: Tonita Cong in Treatment: 0 Visit Information Patient Arrived: Ambulatory Arrival Time: 09:06 Accompanied By: self Transfer Assistance: None Patient Identification Verified: Yes Secondary Verification Process Completed: Yes Patient Requires Transmission-Based Precautions: No Patient Has Alerts: No History Since Last Visit Electronic Signature(s) Signed: 01/27/2023 4:38:40 PM By: Erenest Blank Entered By: Erenest Blank on 01/24/2023 09:06:41 Clinic Level of Care Assessment Details -------------------------------------------------------------------------------- [name] ([id number]) [id number] Page 2 of 9 Patient Name: Date of Service: [name] [place], [place] RO LD 01/24/2023 9:15 A M Medical Record Number: [id number] Patient  Account Number: [id number] Date of Birth/Sex: Treating RN: 10-May-1971 (51 y.o. [name], Lauren Primary Care Graciana Sessa: Asencion Noble Other Clinician: Referring Leler Brion: Treating Deke Tilghman/Extender: Tonita Cong in Treatment: 0 Clinic Level of Care Assessment Items TOOL 3 Quantity Score X- 1 0 Use when EandM and Procedure is performed on FOLLOW-UP visit ASSESSMENTS - Nursing Assessment / Reassessment X- 1 10 Reassessment of Co-morbidities (includes updates in patient status) X- 1 5 Reassessment of Adherence to Treatment Plan ASSESSMENTS - Wound and Skin Assessment / Reassessment []  - Points for Wound Assessment can only be taken for a new wound of unknown or different etiology and a procedure is 0 NOT performed to that wound X- 1 5 Simple Wound Assessment / Reassessment - one wound []  - 0 Complex Wound Assessment / Reassessment - multiple wounds []  - 0 Dermatologic / Skin Assessment (not related to wound area) ASSESSMENTS - Focused Assessment X- 1 5 Circumferential Edema Measurements - multi extremities []  - 0 Nutritional Assessment / Counseling / Intervention []  - 0 Lower Extremity Assessment (monofilament, tuning fork, pulses) []  - 0 Peripheral Arterial Disease Assessment (using hand held doppler) ASSESSMENTS - Ostomy and/or Continence Assessment and Care []  - 0 Incontinence Assessment and Management []  - 0 Ostomy Care Assessment and Management (repouching, etc.) PROCESS - Coordination of Care []  - Points for Discharge Coordination can only be taken for a new wound of unknown or different etiology and a procedure 0 is NOT performed to that wound X- 1 15 Simple Patient / Family Education for ongoing care []  - 0 Complex (extensive) Patient / Family Education for ongoing care X- 1 10 Staff obtains Programmer, systems, Records, T Results / Process Orders est []  - 0 Staff telephones HHA, Nursing Homes / Clarify orders / etc []  - 0 Routine  Transfer to another Facility (non-emergent condition) []  - 0 Routine Hospital Admission (non-emergent condition) X- 1 15 New Admissions / Biomedical engineer / Ordering NPWT Apligraf, etc. , []  - 0 Emergency Hospital Admission (emergent condition) X- 1 10 Simple Discharge Coordination []  - 0 Complex (extensive) Discharge Coordination PROCESS - Special Needs []  - 0  Pediatric / Minor Patient Management []  - 0 Isolation Patient Management []  - 0 Hearing / Language / Visual special needs []  - 0 Assessment of Community assistance (transportation, D/C planning, etc.) []  - 0 Additional assistance / Altered mentation []  - 0 Support Surface(s) Assessment (bed, cushion, seat, etc.) INTERVENTIONS - Wound Cleansing / Measurement []  - Points for Wound Cleaning / Measurement, Wound Dressing, Specimen Collection and Specimen taken to lab can only 0 be taken for a new wound of unknown or different etiology and a procedure is NOT performed to that wound ZERICK, PREVETTE ( ) [id number] Page 3 of 9 X- 1 5 Simple Wound Cleansing - one wound []  - 0 Complex Wound Cleansing - multiple wounds X- 1 5 Wound Imaging (photographs - any number of wounds) []  - 0 Wound Tracing (instead of photographs) X- 1 5 Simple Wound Measurement - one wound []  - 0 Complex Wound Measurement - multiple wounds INTERVENTIONS - Wound Dressings X - Small Wound Dressing one or multiple wounds 1 10 []  - 0 Medium Wound Dressing one or multiple wounds []  - 0 Large Wound Dressing one or multiple wounds INTERVENTIONS - Miscellaneous []  - 0 External ear exam []  - 0 Specimen Collection (cultures, biopsies, blood, body fluids, etc.) []  - 0 Specimen(s) / Culture(s) sent or taken to Lab for analysis []  - 0 Patient Transfer (multiple staff / / Similar devices) []  - 0 Simple Staple / Suture removal (25 or less) []  - 0 Complex Staple / Suture removal (26 or more) []  -  0 Hypo / Hyperglycemic Management (close monitor of Blood Glucose) []  - 0 Ankle / Brachial Index (ABI) - do not check if billed separately X- 1 5 Vital Signs Has the patient been seen at the hospital within the last three years: Yes Total Score: 105 Level Of Care: New/Established - Level 3 Electronic Signature(s) Signed: 01/29/2023 8:39:24 AM By: RN Entered By: on 01/24/2023 10:12:59 -------------------------------------------------------------------------------- Encounter Discharge Information Details Patient Name: Date of Service: [id number] [place], [name] 01/24/2023 9:15 A M Medical Record Number: Patient Account Number: Date of Birth/Sex: Treating RN: June 16, 1971 (52 y.o. , Lauren Primary Care Anastasiya Gowin: Other Clinician: Referring Hobson Lax: Treating Miliani Deike/Extender: in Treatment: 0 Encounter Discharge Information Items Post Procedure Vitals Discharge Condition: Stable Temperature (F): 98.7 Ambulatory Status: Ambulatory Pulse (bpm): 74 Discharge Destination: Home Respiratory Rate (breaths/min): 17 Transportation: Private Auto Blood Pressure (mmHg): 120/80 Accompanied By: self Schedule Follow-up Appointment: Yes Clinical Summary of Care: Patient Declined Electronic Signature(s) Signed: 01/29/2023 8:39:24 AM By: Nurse, adult RN Entered By: on 01/24/2023 10:13:33 ( ) [id number] Page 4 of 9 -------------------------------------------------------------------------------- Lower Extremity Assessment Details Patient Name: Date of Service: Fonnie Mu [place], 01/26/2023 RO LD 01/24/2023 9:15 A M Medical Record Number: 01/26/2023 Patient Account Number: [id number] Date of Birth/Sex: Treating RN: Jan 15, 1971 (52 y.o. 56, Lauren Primary Care Jakiah Goree: Charlean Merl Other Clinician: Referring  Shanta Dorvil: Treating Darrin Koman/Extender: [name] in Treatment: 0 Edema Assessment Assessed: Rogelia Rohrer: Yes] 01/31/2023: No] Edema: [Left: Ye] [Right: s] Calf Left: Right: Point of Measurement: 43 cm From Medial Instep 37.8 cm Ankle Left: Right: Point of Measurement: 9 cm From Medial Instep 22.3 cm Knee To Floor Left: Right: From Medial Instep 52 cm Vascular Assessment Pulses: Dorsalis Pedis Palpable: [Left:Yes] Posterior Tibial Palpable: [Left:Yes] Electronic Signature(s) Signed: 01/29/2023 8:39:24 AM By: Fonnie Mu RN Entered By: 01/26/2023 on 01/24/2023 09:58:32 --------------------------------------------------------------------------------  Multi Wound Chart Details Patient Name: Date of Service: [name] [place], [place] RO LD 01/24/2023 9:15 A M Medical Record Number: [id number] Patient Account Number: [id number] Date of Birth/Sex: Treating RN: March 06, 1971 (52 y.o. M) Primary Care Kadisha Goodine: Asencion Noble Other Clinician: Referring Cortez Steelman: Treating Daphne Karrer/Extender: Tonita Cong in Treatment: 0 Vital Signs Height(in): 78 Capillary Blood Glucose(mg/dl): 170 Weight(lbs): 275 Pulse(bpm): 93 Body Mass Index(BMI): 31.8 Blood Pressure(mmHg): 111/72 Temperature(F): 98.1 Respiratory Rate(breaths/min): 18 [3:Photos:] [N/A:N/A] Left, Plantar Foot N/A N/A Wound Location: Gradually Appeared N/A N/A Wounding Event: Diabetic Wound/Ulcer of the Lower N/A N/A Primary Etiology: Extremity Chronic sinus problems/congestion, N/A N/A Comorbid History: Congestive Heart Failure, Coronary Artery Disease, Hypertension, Myocardial Infarction, Type II Diabetes, Neuropathy 10/01/2022 N/A N/A Date Acquired: 0 N/A N/A Weeks of Treatment: Open N/A N/A Wound Status: No N/A N/A Wound Recurrence: 2.5x2x0.2 N/A N/A Measurements L x W x D (cm) 3.927 N/A N/A A (cm) : rea 0.785 N/A N/A Volume (cm) : Grade 1 N/A  N/A Classification: Medium N/A N/A Exudate A mount: Serosanguineous N/A N/A Exudate Type: red, brown N/A N/A Exudate Color: Distinct, outline attached N/A N/A Wound Margin: Large (67-100%) N/A N/A Granulation A mount: Red, Pink N/A N/A Granulation Quality: None Present (0%) N/A N/A Necrotic A mount: Fat Layer (Subcutaneous Tissue): Yes N/A N/A Exposed Structures: Fascia: No Tendon: No Muscle: No Joint: No Bone: No None N/A N/A Epithelialization: Debridement - Excisional N/A N/A Debridement: Pre-procedure Verification/Time Out 10:00 N/A N/A Taken: Lidocaine N/A N/A Pain Control: Callus, Subcutaneous, Slough N/A N/A Tissue Debrided: Skin/Subcutaneous Tissue N/A N/A Level: 5 N/A N/A Debridement A (sq cm): rea Curette N/A N/A Instrument: Minimum N/A N/A Bleeding: Pressure N/A N/A Hemostasis A chieved: 0 N/A N/A Procedural Pain: 0 N/A N/A Post Procedural Pain: Procedure was tolerated well N/A N/A Debridement Treatment Response: 2.5x2x0.2 N/A N/A Post Debridement Measurements L x W x D (cm) 0.785 N/A N/A Post Debridement Volume: (cm) Callus: Yes N/A N/A Periwound Skin Texture: Excoriation: No Induration: No Crepitus: No Rash: No Scarring: No Dry/Scaly: Yes N/A N/A Periwound Skin Moisture: Maceration: No Atrophie Blanche: No N/A N/A Periwound Skin Color: Cyanosis: No Ecchymosis: No Erythema: No Hemosiderin Staining: No Mottled: No Pallor: No Rubor: No Debridement N/A N/A Procedures Performed: Treatment Notes Electronic Signature(s) Signed: 01/24/2023 11:11:08 AM By: Kalman Shan DO Entered By: Kalman Shan on 01/24/2023 10:06:35 [name] ([id number]) [id number] Page 6 of 9 -------------------------------------------------------------------------------- Multi-Disciplinary Care Plan Details Patient Name: Date of Service: [name] [place], [place] RO LD 01/24/2023 9:15 A M Medical Record Number:  [id number] Patient Account Number: [id number] Date of Birth/Sex: Treating RN: [birth year] (52 y.o. [name], Lauren Primary Care Cameran Ahmed: Asencion Noble Other Clinician: Referring Tell Rozelle: Treating Clemie General/Extender: Tonita Cong in Treatment: 0 Active Inactive Orientation to the Wound Care Program Nursing Diagnoses: Knowledge deficit related to the wound healing center program Goals: Patient/caregiver will verbalize understanding of the [place] Program Date Initiated: 01/24/2023 Target Resolution Date: 02/08/2023 Goal Status: Active Interventions: Provide education on orientation to the wound center Notes: Wound/Skin Impairment Nursing Diagnoses: Impaired tissue integrity Knowledge deficit related to ulceration/compromised skin integrity Goals: Patient will have a decrease in wound volume by X% from date: (specify in notes) Date Initiated: 01/24/2023 Target Resolution Date: 02/07/2023 Goal Status: Active Patient/caregiver will verbalize understanding of skin care regimen Date Initiated: 01/24/2023 Target Resolution Date: 02/07/2023 Goal Status: Active Ulcer/skin breakdown will have a volume reduction of 30% by week 4 Date Initiated: 01/24/2023 Target Resolution Date: 02/08/2023  Goal Status: Active Ulcer/skin breakdown will have a volume reduction of 50% by week 8 Date Initiated: 01/24/2023 Target Resolution Date: 02/08/2023 Goal Status: Active Interventions: Assess patient/caregiver ability to obtain necessary supplies Assess patient/caregiver ability to perform ulcer/skin care regimen upon admission and as needed Assess ulceration(s) every visit Notes: Electronic Signature(s) Signed: 01/29/2023 8:39:24 AM By: Rhae Hammock RN Entered By: Rhae Hammock on 01/24/2023 10:12:07 -------------------------------------------------------------------------------- Pain Assessment Details Patient Name: Date of Service: [name] [place],  [name] 01/24/2023 9:15 A M Medical Record Number: [id number] Patient Account Number: [id number] Date of Birth/Sex: Treating RN: [birth year] (52 y.o. Purcell Nails, Joneen Boers ([id number]) [id number] Page 7 of 9 Primary Care Tu Shimmel: Asencion Noble Other Clinician: Referring Rayleigh Gillyard: Treating Lakrisha Iseman/Extender: Tonita Cong in Treatment: 0 Active Problems Location of Pain Severity and Description of Pain Patient Has Paino No Site Locations Pain Management and Medication Current Pain Management: Electronic Signature(s) Signed: 01/29/2023 8:39:24 AM By: Rhae Hammock RN Entered By: Rhae Hammock on 01/24/2023 09:58:56 -------------------------------------------------------------------------------- Patient/Caregiver Education Details Patient Name: Date of Service: [name] [place], [name] 1/26/2024andnbsp9:15 [place] Record Number: [id number] Patient Account Number: [id number] Date of Birth/Gender: Treating RN: 12/11/1971 (52 y.o. E[place] Noe Primary Care Physician: Asencion Noble Other Clinician: Referring Physician: Treating Physician/Extender: Tonita Cong in Treatment: 0 Education Assessment Education Provided To: Patient Education Topics Provided Wound/Skin Impairment: Methods: Explain/Verbal Responses: Reinforcements needed, State content correctly Electronic Signature(s) Signed: 01/29/2023 8:39:24 AM By: Rhae Hammock RN Entered By: Rhae Hammock on 01/24/2023 10:12:15 [name] ([id number]) [id number] Page 8 of 9 -------------------------------------------------------------------------------- Wound Assessment Details Patient Name: Date of Service: [name] [place], [place] RO LD 01/24/2023 9:15 A M Medical Record Number: [id number] Patient Account Number: [id number] Date of Birth/Sex: Treating RN: 1971-11-11 (52 y.o. Lorette Ang, Meta.Reding Primary Care Keerstin Bjelland: Asencion Noble Other Clinician: Referring Tawanna Funk: Treating Ramces Shomaker/Extender: Tonita Cong in Treatment: 0 Wound Status Wound Number: 3 Primary Diabetic Wound/Ulcer of the Lower Extremity Etiology: Wound Location: Left, Plantar Foot Wound Open Wounding Event: Gradually Appeared Status: Date Acquired: 10/01/2022 Comorbid Chronic sinus problems/congestion, Congestive Heart Failure, Weeks Of Treatment: 0 History: Coronary Artery Disease, Hypertension, Myocardial Infarction, Type Clustered Wound: No II Diabetes, Neuropathy Photos Wound Measurements Length: (cm) 2.5 Width: (cm) 2 Depth: (cm) 0.2 Area: (cm) 3.927 Volume: (cm) 0.785 % Reduction in Area: % Reduction in Volume: Epithelialization: None Tunneling: No Undermining: No Wound Description Classification: Grade 1 Wound Margin: Distinct, outline attached Exudate Amount: Medium Exudate Type: Serosanguineous Exudate Color: red, brown Foul Odor After Cleansing: No Slough/Fibrino No Wound Bed Granulation Amount: Large (67-100%) Exposed Structure Granulation Quality: Red, Pink Fascia Exposed: No Necrotic Amount: None Present (0%) Fat Layer (Subcutaneous Tissue) Exposed: Yes Tendon Exposed: No Muscle Exposed: No Joint Exposed: No Bone Exposed: No Periwound Skin Texture Texture Color No Abnormalities Noted: No No Abnormalities Noted: No Callus: Yes Atrophie Blanche: No Crepitus: No Cyanosis: No Excoriation: No Ecchymosis: No Induration: No Erythema: No Rash: No Hemosiderin Staining: No Scarring: No Mottled: No Pallor: No Moisture Rubor: No No Abnormalities Noted: No Dry / Scaly: Yes Maceration: No KAMONTE, MCMICHEN ([id number]) [id number] Page 9 of 9 Treatment Notes Wound #3 (Foot) Wound Laterality: Plantar, Left Cleanser Wound Cleanser Discharge Instruction: Cleanse the wound with wound cleanser prior  to applying a clean dressing using [name] sponges, not tissue or cotton balls. Peri-Wound Care Topical Primary Dressing vashe wet to dry Secondary Dressing ABD Pad, 5x9 Discharge Instruction: Apply over primary dressing as  directed. Secured With [employer], 4.5x3.1 (in/yd) Discharge Instruction: Secure with Kerlix as directed. 90M Medipore H Soft Cloth Surgical T ape, 4 x 10 (in/yd) Discharge Instruction: Secure with tape as directed. Compression Wrap Compression Stockings Add-Ons Electronic Signature(s) Signed: 01/24/2023 4:56:20 PM By: Shawn Stall RN, BSN Signed: 01/27/2023 4:38:40 PM By: Thayer Dallas Entered By: Thayer Dallas on 01/24/2023 09:39:13 -------------------------------------------------------------------------------- Vitals Details Patient Name: Date of Service: Bruce [place], [name] 01/24/2023 9:15 A M Medical Record Number: [id number] Patient Account Number: [id number] Date of Birth/Sex: Treating RN: 1971-08-19 (52 y.o. M) Primary Care Alisan Dokes: [name] Other Clinician: Referring Kyli Sorter: Treating Bryanna Yim/Extender: Rogelia Rohrer in Treatment: 0 Vital Signs Time Taken: 09:06 Temperature (F): 98.1 Height (in): 78 Pulse (bpm): 89 Source: Stated Respiratory Rate (breaths/min): 18 Weight (lbs): 275 Blood Pressure (mmHg): 111/72 Source: Stated Capillary Blood Glucose (mg/dl): 885 Body Mass Index (BMI): 31.8 Reference Range: 80 - 120 mg / dl Electronic Signature(s) Signed: 01/27/2023 4:38:40 PM By: Thayer Dallas Entered By: Thayer Dallas on 01/24/2023 09:07:24

## 2023-01-29 NOTE — Progress Notes (Signed)
LATERRANCE, NAUTA ([id number]) [id number] Page 1 of 10 Visit Report for 01/24/2023 Chief Complaint Document Details Patient Name: Date of Service: [name] [place], [name] RO LD 01/24/2023 9:15 A M Medical Record Number: [id number] Patient Account Number: [id number] Date of Birth/Sex: Treating RN: 04-30-1971 (52 y.o. M) Primary Care Provider: [name] Other Clinician: Referring Provider: Treating Provider/Extender: Rogelia Rohrer in Treatment: 0 Information Obtained from: Patient Chief Complaint 01/15/2023; left foot wound Electronic Signature(s) Signed: 01/24/2023 11:11:08 AM By: Geralyn Corwin DO Entered By: Geralyn Corwin on 01/24/2023 10:06:56 -------------------------------------------------------------------------------- Debridement Details Patient Name: Date of Service: [name] [place], [name] 01/24/2023 9:15 A M Medical Record Number: [id number] Patient Account Number: [id number] Date of Birth/Sex: Treating RN: 12-28-1971 (52 y.o. Charlean Merl, Lauren Primary Care Provider: [name] Other Clinician: Referring Provider: Treating Provider/Extender: Rogelia Rohrer in Treatment: 0 Debridement Performed for Assessment: Wound #3 Left,Plantar Foot Performed By: Physician Geralyn Corwin, DO Debridement Type: Debridement Severity of Tissue Pre Debridement: Fat layer exposed Level of Consciousness (Pre-procedure): Awake and Alert Pre-procedure Verification/Time Out Yes - 10:00 Taken: Start Time: 10:00 Pain Control: Lidocaine T Area Debrided (L x W): otal 2.5 (cm) x 2 (cm) = 5 (cm) Tissue and other material debrided: Viable, Non-Viable, Callus, Slough, Subcutaneous, Slough Level: Skin/Subcutaneous Tissue Debridement Description: Excisional Instrument: Curette Bleeding: Minimum Hemostasis Achieved: Pressure End Time: 10:00 Procedural Pain: 0 Post Procedural Pain: 0 Response to  Treatment: Procedure was tolerated well Level of Consciousness (Post- Awake and Alert procedure): Post Debridement Measurements of Total Wound Length: (cm) 2.5 Width: (cm) 2 Depth: (cm) 0.2 Volume: (cm) 0.785 Character of Wound/Ulcer Post Debridement: Improved Severity of Tissue Post Debridement: Fat layer exposed [name] ([id number]) [id number] Page 2 of 10 Post Procedure Diagnosis Same as Pre-procedure Electronic Signature(s) Signed: 01/24/2023 11:11:08 AM By: Geralyn Corwin DO Signed: 01/29/2023 8:39:24 AM By: Fonnie Mu RN Entered By: Fonnie Mu on 01/24/2023 10:00:08 -------------------------------------------------------------------------------- HPI Details Patient Name: Date of Service: [name] IN, [name] 01/24/2023 9:15 A M Medical Record Number: [id number] Patient Account Number: [id number] Date of Birth/Sex: Treating RN: 27-Feb-1971 (52 y.o. M) Primary Care Provider: [name] Other Clinician: Referring Provider: Treating Provider/Extender: Rogelia Rohrer in Treatment: 0 History of Present Illness HPI Description: ADMISSION 01/11/2020 This is a 52 year old man with uncontrolled type 2 diabetes with a recent hemoglobin A1c earlier this year of 13.4. He is on insulin and glipizide. He does not take his blood sugars at home. He does have a follow-up with primary care later this month I believe on January 27. He tells [place] that roughly a month ago he was walking with a shoe with a hole in his foot. He took the shoe off and there was an open wound at roughly the left fourth met head. This has significant undermining and raised edges. He has not noticed any purulence he does not feel unwell. More recently he was taking skin off the bottom of his foot and has a superficial area on the left fifth met head. He has not been offloading this. The patient was in the ER on 12/20. They gave him  Bactroban which she has been using on the wound and 10 days worth of doxycycline. No x-rays were done. He has not had vascular studies. He is also been using hydrogen peroxide. Past medical history type 2 diabetes uncontrolled, chronic systolic heart failure, coronary artery disease with a history of congestive heart failure with stents. Hypertension hyperlipidemia and chronic  renal insufficiency ABI in this clinic was 1.14 on the left. 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 He is on his feet a lot. He is uncertain whether he would be able to work if we put him on some form of restriction 1/19; he is generally doing quite well. Using silver alginate on the wounds. Things actually look better. He has a forefoot offloading boot which she seems to be compliant about. He has support at work to stay off his foot is much as possible which is gratifying. Culture I did last week showed a few Enterococcus faecalis. I am going to put him on Augmentin. I talked about ordering an x-ray in my note last week but that does not seem to have happened. We will review reorder the x-ray this week. 1/26; x-ray reordered last week was negative for osteomyelitis. We are using silver alginate on the wound on the third and fifth met heads on the left. He is using a Darco forefoot off loader 2/2; the area on the fifth met head is closed. Third met head is still open with tunneling depth and thick callus. 2/9; the area on the fifth met head remains closed however the third met head again has a small open area on presentation with tunneling in depth and surrounded by thick callus. This looks like a pressure issue. We have been using silver alginate 2/16; the area of the fifth metatarsal head remains closed however the area over the third metatarsal head again is a small open area but with some depth. I do not think this is changed much since last week. He is using Hydrofera Blue with forefoot off loader. 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 Fortunately the wound does not look infected. I changed him to endoform today 2/26; the area of the fifth metatarsal head remains closed. The area of the third metatarsal head has an even smaller opening this time. I used endoform on this this looks improved. He is offloading this is much as he can and a forefoot off loader on the left. He cannot have a total contact cast because of work responsibilities 3/5; the area on the fifth metatarsal head remains closed the area on the third metatarsal head is also closed on the left foot. 01/15/2023 Mr. 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 He has been following for podiatry for several months for this issue. He is currently using wet-to-dry saline dressings. He currently denies signs of infection. Progresse note from 1/17; Patient presents with a 2644-month history of nonhealing ulcer to the left foot secondary to diabetes and inability to offload well the area. He has had multiple debridements in the past to his feet bilaterally. He has had resection of the fourth left met head in the fifth toe Secondary to osteomyelitis. We discussed the importance of glycemic control for wound healing. Due to his blood glucose levels being elevated he is at high risk for infection and thus further amputation. He expressed understanding. He states he is supposed to be referred to an endocrinologist at [facility] however the referral fell through. Unclear  what happened. Offered a referral to endocrinology at [facility]. Patient was  agreeable.Furthermore we discussed the importance of aggressive offloading for his wound healing. This will be the most difficult part of the treatment plan for the patient to do. We discussed a total contact cast however he has declined that at this time. 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 He is currently using a surgical shoe with a peg assist. It does not fit well so we will give him a new one today. I do not think this is enough offloading. If he is not able to offload this area he will likely end up with a BKA. He is well aware of this. For now I recommended Medihoney and Hydrofera Blue for dressing changes. He will follow-up in our [place] office Since this is closer for him. 1/26; patient presents for follow-up. Last clinic visit I had seen him in [place] and we transferred him to [place] since this is a closer location for him. Progress note above from that visit. Over the past week He has been using Medihoney and Hydrofera Blue to the wound bed. He has been using his surgical shoe with peg assist. He has no issues or complaints today. We discussed doing the total contact cast and he was agreeable to having this placed at next KOLBEE, BOGUSZ ([id number]) [phone].pdf Page 3 of 10 clinic visit. Electronic Signature(s) Signed: 01/24/2023 11:11:08 AM By: Geralyn Corwin DO Entered By: Geralyn Corwin on 01/24/2023 10:07:42 -------------------------------------------------------------------------------- Physical Exam Details Patient Name: Date of Service: [name] IN, [name] 01/24/2023 9:15 A M Medical Record Number: [id number] Patient Account Number: [id number] Date of Birth/Sex: Treating RN: [birth year] (52 y.o. M) Primary Care Provider: [name] Other Clinician: Referring Provider: Treating Provider/Extender: Rogelia Rohrer in  Treatment: 0 Constitutional respirations regular, non-labored and within target range for patient.. Cardiovascular 2+ dorsalis pedis/posterior tibialis pulses. Psychiatric pleasant and cooperative. Notes Left foot: T the lateral aspect, fifth met head there is an open wound with nonviable tissue, callus and granulation tissue. No signs of infection including o increased warmth, erythema or purulent drainage. Electronic Signature(s) Signed: 01/24/2023 11:11:08 AM By: Geralyn Corwin DO Entered By: Geralyn Corwin on 01/24/2023 10:08:52 -------------------------------------------------------------------------------- Physician Orders Details Patient Name: Date of Service: [name] IN, [name] 01/24/2023 9:15 A M Medical Record Number: [id number] Patient Account Number: [id number] Date of Birth/Sex: Treating RN: 1971/08/06 (52 y.o. Charlean Merl, Lauren Primary Care Provider: [name] Other Clinician: Referring Provider: Treating Provider/Extender: Rogelia Rohrer in Treatment: 0 Verbal / Phone Orders: No Diagnosis Coding ICD-10 Coding Code Description [phone] 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.621 Type 2 diabetes mellitus with foot ulcer Z89.422 Acquired absence of other left toe(s) Follow-up Appointments ppointment in 1 week. - w/ Dr. Mikey Bussing next Friday 01/31/23 @ 0800 Rm # 7 AND Monday 02/03/23 @ 1015 Rm # 7 Return A ***TCC*** ppointment in 2 weeks. - w/ Dr. Mikey Bussing 02/07/23 @ 0845 Rm # 7 Return A ***TCC*** ZEIN, HELBING ([id number]) [id number] Page 4 of 10 Anesthetic (In clinic) Topical Lidocaine 5% applied to wound bed Bathing/ Shower/ Hygiene May shower with protection but do not get wound dressing(s) wet. Protect dressing(s) with water repellant cover (for example, large plastic bag) or a cast cover and may then take shower. Off-Loading Other: - keep  pressure  off left lateral foot Wound Treatment Wound #3 - Foot Wound Laterality: Plantar, Left Cleanser: Wound Cleanser 1 x Per Day/15 Days Discharge Instructions: Cleanse the wound with wound cleanser prior to applying a clean dressing using gauze sponges, not tissue or cotton balls. Prim Dressing: vashe wet to dry ary 1 x Per XTG/62 Days Secondary Dressing: ABD Pad, 5x9 1 x Per Day/15 Days Discharge Instructions: Apply over primary dressing as directed. Secured With: [employer], 4.5x3.1 (in/yd) 1 x Per Day/15 Days Discharge Instructions: Secure with Kerlix as directed. Secured With: 29M Medipore H Soft Cloth Surgical T ape, 4 x 10 (in/yd) 1 x Per Day/15 Days Discharge Instructions: Secure with tape as directed. Patient Medications llergies: Jardiance A Notifications Medication Indication Start End PRN debridements/pain lidocaine DOSE topical 5 % gel - gel topical once daily Electronic Signature(s) Signed: 01/24/2023 11:11:08 AM By: Kalman Shan DO Entered By: Kalman Shan on 01/24/2023 10:09:04 -------------------------------------------------------------------------------- Problem List Details Patient Name: Date of Service: Cherrie Gauze IN, [name] 01/24/2023 9:15 A M Medical Record Number: [id number] Patient Account Number: [id number] Date of Birth/Sex: Treating RN: 04/17/1971 (52 y.o. M) Primary Care Provider: Asencion Noble Other Clinician: Referring Provider: Treating Provider/Extender: Tonita Cong in Treatment: 0 Active Problems ICD-10 Encounter Code Description Active Date MDM Diagnosis [phone] Non-pressure chronic ulcer of other part of left foot with fat layer exposed 01/24/2023 No Yes E11.621 Type 2 diabetes mellitus with foot ulcer 01/24/2023 No Yes Z89.422 Acquired absence of other left toe(s) 01/24/2023 No Yes DRAEDYN, WEIDINGER ([id number]) [phone].pdf Page 5 of 10 Inactive Problems Resolved  Problems Electronic Signature(s) Signed: 01/24/2023 11:11:08 AM By: Kalman Shan DO Entered By: Kalman Shan on 01/24/2023 10:06:30 -------------------------------------------------------------------------------- Progress Note Details Patient Name: Date of Service: Cherrie Gauze IN, [name] 01/24/2023 9:15 A M Medical Record Number: [id number] Patient Account Number: [id number] Date of Birth/Sex: Treating RN: 12-26-1971 (52 y.o. M) Primary Care Provider: Asencion Noble Other Clinician: Referring Provider: Treating Provider/Extender: Tonita Cong in Treatment: 0 Subjective Chief Complaint Information obtained from Patient 01/15/2023; left foot wound History of Present Illness (HPI) ADMISSION 01/11/2020 This is a 52 year old man with uncontrolled type 2 diabetes with a recent hemoglobin A1c earlier this year of 13.4. He is on insulin and glipizide. He does not take his blood sugars at home. He does have a follow-up with primary care later this month I believe on January 27. He tells [place] that roughly a month ago he was walking with a shoe with a hole in his foot. He took the shoe off and there was an open wound at roughly the left fourth met head. This has significant undermining and raised edges. He has not noticed any purulence he does not feel unwell. More recently he was taking skin off the bottom of his foot and has a superficial area on the left fifth met head. He has not been offloading this. The patient was in the ER on 12/20. They gave him Bactroban which she has been using on the wound and 10 days worth of doxycycline. No x-rays were done. He has not had vascular studies. He is also been using hydrogen peroxide. Past medical history type 2 diabetes uncontrolled, chronic systolic heart failure, coronary artery disease with a history of congestive heart failure with stents. Hypertension hyperlipidemia and chronic renal insufficiency ABI in this clinic  was 1.14 on the left. 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 He is uncertain  whether he would be able to work if we put him on some form of restriction 1/19; he is generally doing quite well. Using silver alginate on the wounds. Things actually look better. He has a forefoot offloading boot which she seems to be compliant about. He has support at work to stay off his foot is much as possible which is gratifying. Culture I did last week showed a few Enterococcus faecalis. I am going to put him on Augmentin. I talked about ordering an x-ray in my note last week but that does not seem to have happened. We will review reorder the x-ray this week. 1/26; x-ray reordered last week was negative for osteomyelitis. We are using silver alginate on the wound on the third and fifth met heads on the left. He is using a Darco forefoot off loader 2/2; the area on the fifth met head is closed. Third met head is still open with tunneling depth and thick callus. 2/9; the area on the fifth met head remains closed however the third met head again has a small open area on presentation with tunneling in depth and surrounded by thick callus. This looks like a pressure issue. We have been using silver alginate 2/16; the area of the fifth metatarsal head remains closed however the area over the third metatarsal head again is a small open area but with some depth. I do not think this is changed much since last week. He is using Hydrofera Blue with forefoot off loader. He is not able to use a total contact cast on the left leg because he needs his left leg at work [employer]]. Fortunately the wound does not look infected. I changed him to endoform today 2/26; the area of the fifth metatarsal head remains closed. The area of the third metatarsal head has an even smaller opening this time. I used endoform on this this looks improved. He is offloading this is much as he can and a forefoot off  loader on the left. He cannot have a total contact cast because of work responsibilities 3/5; the area on the fifth metatarsal head remains closed the area on the third metatarsal head is also closed on the left foot. 01/15/2023 Mr. 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 He has been following for podiatry for several months for this issue. He is currently using wet-to-dry saline dressings. He currently denies signs of infection. Progresse note from 1/17; Patient presents with a 42-month history of nonhealing ulcer to the left foot secondary to diabetes and inability to offload well the area. He has had multiple debridements in the past to his feet bilaterally. He has had resection of the fourth left met head in the fifth toe Secondary to osteomyelitis. We discussed the importance of glycemic control for wound healing. Due to his blood glucose levels being elevated he is at high risk for infection and thus further amputation. He expressed understanding. He states he is supposed to be referred to an endocrinologist at [facility] however the referral fell through. Unclear what happened. Offered a referral to endocrinology at [facility]. Patient was agreeable.Furthermore we discussed the importance of aggressive offloading for his wound healing. This will be the most  difficult part of the treatment plan for the patient to do. We discussed a total contact cast however he has declined that at this time. 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 He is currently using a surgical shoe with a peg  assist. It does not fit well so we will give him a new one today. I do not think this is enough offloading. If he is not able to offload this area he will likely end up with a BKA. He is well aware of this. For now I recommended Medihoney and Hydrofera Blue for dressing changes. He will follow-up in our [place] office Since this is closer for him. Glenn McgeeFOUNTAIN, [name] ([id number]) [id number] Page 6 of 10 1/26; patient presents for follow-up. Last clinic visit I had seen him in [place] and we transferred him to [place] since this is a closer location for him. Progress note above from that visit. Over the past week He has been using Medihoney and Hydrofera Blue to the wound bed. He has been using his surgical shoe with peg assist. He has no issues or complaints today. We discussed doing the total contact cast and he was agreeable to having this placed at next clinic visit. Patient History Information obtained from Patient. Allergies Jardiance (Severity: Mild, Reaction: itching) Family History Heart Disease - Mother,Father, No family history of Cancer, Diabetes, Hereditary Spherocytosis, Hypertension, Kidney Disease, Lung Disease, Seizures, Stroke, Thyroid Problems, Tuberculosis. Social History Never smoker, Marital Status - Single, Alcohol Use - Rarely, Drug Use - No History, Caffeine Use - Rarely. Medical History Eyes Denies history of Cataracts, Glaucoma, Optic Neuritis Ear/Nose/Mouth/Throat Patient has history of Chronic sinus problems/congestion Denies history of Middle ear problems Hematologic/Lymphatic Denies history of Anemia, Hemophilia, Human Immunodeficiency Virus, Lymphedema, Sickle Cell Disease Respiratory Denies history of Aspiration, Asthma, Chronic Obstructive Pulmonary Disease (COPD), Pneumothorax, Sleep Apnea, Tuberculosis Cardiovascular Patient has history of Congestive Heart Failure, Coronary Artery Disease, Hypertension, Myocardial  Infarction - age 52 Denies history of Angina, Arrhythmia, Deep Vein Thrombosis, Hypotension, Peripheral Arterial Disease, Peripheral Venous Disease, Phlebitis, Vasculitis Gastrointestinal Denies history of Cirrhosis , Colitis, Crohnoos, Hepatitis A, Hepatitis B, Hepatitis C Endocrine Patient has history of Type II Diabetes Genitourinary Denies history of End Stage Renal Disease Immunological Denies history of Lupus Erythematosus, Raynaudoos, Scleroderma Integumentary (Skin) Denies history of History of Burn Musculoskeletal Denies history of Gout, Rheumatoid Arthritis, Osteoarthritis, Osteomyelitis Neurologic Patient has history of Neuropathy Denies history of Dementia, Quadriplegia, Paraplegia, Seizure Disorder Oncologic Denies history of Received Chemotherapy, Received Radiation Psychiatric Denies history of Anorexia/bulimia, Confinement Anxiety Patient is treated with Insulin. Blood sugar is not tested. Hospitalization/Surgery History - Heart Cath in 2018. Medical A Surgical History Notes nd Cardiovascular Ischemic Cardiomyopathy Genitourinary Renal Insufficiency Review of Systems (ROS) Constitutional Symptoms (General Health) Denies complaints or symptoms of Fatigue, Fever, Chills, Marked Weight Change. Eyes Denies complaints or symptoms of Dry Eyes, Vision Changes, Glasses / Contacts. Ear/Nose/Mouth/Throat Denies complaints or symptoms of Chronic sinus problems or rhinitis. Respiratory Denies complaints or symptoms of Chronic or frequent coughs, Shortness of Breath. Cardiovascular Denies complaints or symptoms of Chest pain. Gastrointestinal Denies complaints or symptoms of Frequent diarrhea, Nausea, Vomiting. Genitourinary Denies complaints or symptoms of Frequent urination. Integumentary (Skin) Complains or has symptoms of Wounds - left foot. Musculoskeletal Denies complaints or symptoms of Muscle Pain, Muscle Weakness. Neurologic Denies complaints or symptoms  of Numbness/parasthesias. Psychiatric Denies complaints or symptoms  of Claustrophobia. HASHEM, GOYNES ([id number]) [id number] Page 7 of 10 Objective Constitutional respirations regular, non-labored and within target range for patient.. Vitals Time Taken: 9:06 AM, Height: 78 in, Source: Stated, Weight: 275 lbs, Source: Stated, BMI: 31.8, Temperature: 98.1 F, Pulse: 89 bpm, Respiratory Rate: 18 breaths/min, Blood Pressure: 111/72 mmHg, Capillary Blood Glucose: 170 mg/dl. Cardiovascular 2+ dorsalis pedis/posterior tibialis pulses. Psychiatric pleasant and cooperative. General Notes: Left foot: T the lateral aspect, fifth met head there is an open wound with nonviable tissue, callus and granulation tissue. No signs of infection o including increased warmth, erythema or purulent drainage. Integumentary (Hair, Skin) Wound #3 status is Open. Original cause of wound was Gradually Appeared. The date acquired was: 10/01/2022. The wound is located on the [place]. The wound measures 2.5cm length x 2cm width x 0.2cm depth; 3.927cm^2 area and 0.785cm^3 volume. There is Fat Layer (Subcutaneous Tissue) exposed. There is no tunneling or undermining noted. There is a medium amount of serosanguineous drainage noted. The wound margin is distinct with the outline attached to the wound base. There is large (67-100%) red, pink granulation within the wound bed. There is no necrotic tissue within the wound bed. The periwound skin appearance exhibited: Callus, Dry/Scaly. The periwound skin appearance did not exhibit: Crepitus, Excoriation, Induration, Rash, Scarring, Maceration, Atrophie Blanche, Cyanosis, Ecchymosis, Hemosiderin Staining, Mottled, Pallor, Rubor, Erythema. 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 I debrided nonviable tissue. He is not able to offload this area well. We discussed doing the total contact cast and patient was agreeable to this. We will place this in 1 week. There was slightly more nonviable tissue today than last week and I recommended Vashe wet-to-dry dressings. He would also benefit greatly from a skin substitute and reports that this was used before to help heal his wounds. We will run insurance verification on Grafix and organogenesis. Follow-up in 1 week. Procedures Wound #3 Pre-procedure diagnosis of Wound #3 is a Diabetic Wound/Ulcer of the Lower Extremity located on the Left,Plantar Foot .Severity of Tissue Pre Debridement is: Fat layer exposed. There was a Excisional Skin/Subcutaneous Tissue Debridement with a total area of 5 sq cm performed by Kalman Shan, DO. With the following instrument(s): Curette to remove Viable and Non-Viable tissue/material. Material removed includes Callus, Subcutaneous Tissue, and Slough after achieving pain control using Lidocaine. No specimens were taken. A time out was conducted at 10:00, prior to the start of the procedure. A Minimum amount of bleeding was controlled with Pressure. The procedure was tolerated well with a pain level of 0 throughout and a pain level of 0 following the procedure. Post Debridement Measurements: 2.5cm length x 2cm width x 0.2cm depth; 0.785cm^3 volume. Character of Wound/Ulcer Post Debridement is improved. Severity of Tissue Post Debridement is: Fat layer exposed. Post procedure Diagnosis Wound #3: Same as Pre-Procedure Plan Follow-up Appointments: Return Appointment in 1 week. - w/ Dr. Heber [place] next Friday 01/31/23 @ 0800 Rm # 7 AND Monday 02/03/23 @ 1015 Rm # 7 ***[facility]*** Return Appointment in 2 weeks. - w/ Dr. Heber [place] 02/07/23 @ 0845 Rm # 7 ***[facility]*** Anesthetic: (In clinic) Topical Lidocaine 5% applied to wound bed Bathing/ Shower/ Hygiene: May shower with protection but do not get  wound dressing(s) wet. Protect dressing(s) with water repellant cover (for example, large plastic bag) or a cast cover and may then take shower.  Off-Loading: Other: - keep pressure off left lateral foot The following medication(s) was prescribed: lidocaine topical 5 % gel gel topical once daily for PRN debridements/pain was prescribed at facility WOUND #3: - Foot Wound Laterality: Plantar, Left Cleanser: Wound Cleanser 1 x Per Day/15 Days Discharge Instructions: Cleanse the wound with wound cleanser prior to applying a clean dressing using gauze sponges, not tissue or cotton balls. Glenn McgeeFOUNTAIN, [name] ([id number]) [id number] Page 8 of 10 Prim Dressing: vashe wet to dry 1 x Per Day/15 Days ary Secondary Dressing: ABD Pad, 5x9 1 x Per Day/15 Days Discharge Instructions: Apply over primary dressing as directed. Secured With: [employer] Roll Sterile, 4.5x3.1 (in/yd) 1 x Per Day/15 Days Discharge Instructions: Secure with Kerlix as directed. Secured With: 58M Medipore H Soft Cloth Surgical T ape, 4 x 10 (in/yd) 1 x Per Day/15 Days Discharge Instructions: Secure with tape as directed. 1. In office sharp debridement 2. Vashe wet-to-dry dressings 3. Run IVR for Grafix and organogenesis 4. Continue aggressive offloadingoosurgical shoe with peg assist 5. Plan for total contact cast at next clinic visit 6. Follow-up in 1 week Electronic Signature(s) Signed: 01/24/2023 11:11:08 AM By: Geralyn CorwinHoffman, Walterine Amodei DO Entered By: Geralyn CorwinHoffman, Jency Schnieders on 01/24/2023 10:12:13 -------------------------------------------------------------------------------- HxROS Details Patient Name: Date of Service: Glenn DonathFO UNTA IN, [name] 01/24/2023 9:15 A M Medical Record Number: [id number] Patient Account Number: [id number] Date of Birth/Sex: Treating RN: 09-11-1971 (52 y.o. M) Primary Care Provider: Shan LevansWright, Patrick Other Clinician: Referring Provider: Treating Provider/Extender: Rogelia RohrerHoffman, Galilee Pierron Standiford,  Alexander Weeks in Treatment: 0 Information Obtained From Patient Constitutional Symptoms (General Health) Complaints and Symptoms: Negative for: Fatigue; Fever; Chills; Marked Weight Change Eyes Complaints and Symptoms: Negative for: Dry Eyes; Vision Changes; Glasses / Contacts Medical History: Negative for: Cataracts; Glaucoma; Optic Neuritis Ear/Nose/Mouth/Throat Complaints and Symptoms: Negative for: Chronic sinus problems or rhinitis Medical History: Positive for: Chronic sinus problems/congestion Negative for: Middle ear problems Respiratory Complaints and Symptoms: Negative for: Chronic or frequent coughs; Shortness of Breath Medical History: Negative for: Aspiration; Asthma; Chronic Obstructive Pulmonary Disease (COPD); Pneumothorax; Sleep Apnea; Tuberculosis Cardiovascular Complaints and Symptoms: Negative for: Chest pain Medical History: Positive for: Congestive Heart Failure; Coronary Artery Disease; Hypertension; Myocardial Infarction - age 52 Negative for: Angina; Arrhythmia; Deep Vein Thrombosis; Hypotension; Peripheral Arterial Disease; Peripheral Venous Disease; Phlebitis; Vasculitis Past Medical History Notes: Glenn McgeeFOUNTAIN, Robet ([id number]) [id number] Page 9 of 10 Ischemic Cardiomyopathy Gastrointestinal Complaints and Symptoms: Negative for: Frequent diarrhea; Nausea; Vomiting Medical History: Negative for: Cirrhosis ; Colitis; Crohns; Hepatitis A; Hepatitis B; Hepatitis C Genitourinary Complaints and Symptoms: Negative for: Frequent urination Medical History: Negative for: End Stage Renal Disease Past Medical History Notes: Renal Insufficiency Integumentary (Skin) Complaints and Symptoms: Positive for: Wounds - left foot Medical History: Negative for: History of Burn Musculoskeletal Complaints and Symptoms: Negative for: Muscle Pain; Muscle Weakness Medical History: Negative for: Gout; Rheumatoid Arthritis;  Osteoarthritis; Osteomyelitis Neurologic Complaints and Symptoms: Negative for: Numbness/parasthesias Medical History: Positive for: Neuropathy Negative for: Dementia; Quadriplegia; Paraplegia; Seizure Disorder Psychiatric Complaints and Symptoms: Negative for: Claustrophobia Medical History: Negative for: Anorexia/bulimia; Confinement Anxiety Hematologic/Lymphatic Medical History: Negative for: Anemia; Hemophilia; Human Immunodeficiency Virus; Lymphedema; Sickle Cell Disease Endocrine Medical History: Positive for: Type II Diabetes Time with diabetes: ten years Treated with: Insulin Blood sugar tested every day: No Immunological Medical History: Negative for: Lupus Erythematosus; Raynauds; Scleroderma Oncologic Medical History: Negative for: Received Chemotherapy; Received Radiation HBO Extended History Items Ear/Nose/Mouth/Throat: Chronic sinus problems/congestion Glenn McgeeFOUNTAIN, Aziz ([id number]) [id number] Page 10 of 10 Immunizations Pneumococcal Vaccine: Received Pneumococcal Vaccination:  No Implantable Devices None Hospitalization / Surgery History Type of Hospitalization/Surgery Heart Cath in 2018 Family and Social History Cancer: No; Diabetes: No; Heart Disease: Yes - Mother,Father; Hereditary Spherocytosis: No; Hypertension: No; Kidney Disease: No; Lung Disease: No; Seizures: No; Stroke: No; Thyroid Problems: No; Tuberculosis: No; Never smoker; Marital Status - Single; Alcohol Use: Rarely; Drug Use: No History; Caffeine Use: Rarely; Financial Concerns: No; Food, Clothing or Shelter Needs: No; Support System Lacking: No; Transportation Concerns: No Electronic Signature(s) Signed: 01/24/2023 11:11:08 AM By: Kalman Shan DO Signed: 01/27/2023 4:38:40 PM By: Erenest Blank Entered By: Erenest Blank on 01/24/2023 09:19:13 -------------------------------------------------------------------------------- SuperBill Details Patient Name: Date  of Service: Cherrie Gauze IN, [name] 01/24/2023 Medical Record Number: [id number] Patient Account Number: [id number] Date of Birth/Sex: Treating RN: May 18, 1971 (52 y.o. M) Primary Care Provider: Asencion Noble Other Clinician: Referring Provider: Treating Provider/Extender: Tonita Cong in Treatment: 0 Diagnosis Coding ICD-10 Codes Code Description [phone] 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.621 Type 2 diabetes mellitus with foot ulcer Z89.422 Acquired absence of other left toe(s) Facility Procedures : CPT4 Code: [id number] Description: 93570 - DEB SUBQ TISSUE 20 SQ CM/< ICD-10 Diagnosis Description L97.522 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.621 Type 2 diabetes mellitus with foot ulcer Modifier: Quantity: 1 Physician Procedures : CPT4 Code Description Modifier [id number] - WC PHYS SUBQ TISS 20 SQ CM ICD-10 Diagnosis Description L97.522 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.621 Type 2 diabetes mellitus with foot ulcer Quantity: 1 Electronic Signature(s) Signed: 01/24/2023 11:11:08 AM By: Kalman Shan DO Entered By: Kalman Shan on 01/24/2023 10:14:22

## 2023-01-31 ENCOUNTER — Encounter (HOSPITAL_BASED_OUTPATIENT_CLINIC_OR_DEPARTMENT_OTHER): Payer: 59 | Attending: Internal Medicine | Admitting: Internal Medicine

## 2023-01-31 DIAGNOSIS — N189 Chronic kidney disease, unspecified: Secondary | ICD-10-CM | POA: Insufficient documentation

## 2023-01-31 DIAGNOSIS — L97522 Non-pressure chronic ulcer of other part of left foot with fat layer exposed: Secondary | ICD-10-CM | POA: Insufficient documentation

## 2023-01-31 DIAGNOSIS — I2510 Atherosclerotic heart disease of native coronary artery without angina pectoris: Secondary | ICD-10-CM | POA: Diagnosis not present

## 2023-01-31 DIAGNOSIS — I5042 Chronic combined systolic (congestive) and diastolic (congestive) heart failure: Secondary | ICD-10-CM | POA: Diagnosis not present

## 2023-01-31 DIAGNOSIS — E1122 Type 2 diabetes mellitus with diabetic chronic kidney disease: Secondary | ICD-10-CM | POA: Insufficient documentation

## 2023-01-31 DIAGNOSIS — E785 Hyperlipidemia, unspecified: Secondary | ICD-10-CM | POA: Insufficient documentation

## 2023-01-31 DIAGNOSIS — E11621 Type 2 diabetes mellitus with foot ulcer: Secondary | ICD-10-CM | POA: Insufficient documentation

## 2023-01-31 DIAGNOSIS — Z89422 Acquired absence of other left toe(s): Secondary | ICD-10-CM | POA: Insufficient documentation

## 2023-01-31 DIAGNOSIS — I252 Old myocardial infarction: Secondary | ICD-10-CM | POA: Diagnosis not present

## 2023-01-31 DIAGNOSIS — Z794 Long term (current) use of insulin: Secondary | ICD-10-CM | POA: Diagnosis not present

## 2023-01-31 DIAGNOSIS — Z7984 Long term (current) use of oral hypoglycemic drugs: Secondary | ICD-10-CM | POA: Insufficient documentation

## 2023-01-31 DIAGNOSIS — I130 Hypertensive heart and chronic kidney disease with heart failure and stage 1 through stage 4 chronic kidney disease, or unspecified chronic kidney disease: Secondary | ICD-10-CM | POA: Insufficient documentation

## 2023-01-31 DIAGNOSIS — E1140 Type 2 diabetes mellitus with diabetic neuropathy, unspecified: Secondary | ICD-10-CM | POA: Insufficient documentation

## 2023-02-03 ENCOUNTER — Other Ambulatory Visit (HOSPITAL_COMMUNITY): Payer: Self-pay

## 2023-02-03 ENCOUNTER — Encounter (HOSPITAL_BASED_OUTPATIENT_CLINIC_OR_DEPARTMENT_OTHER): Payer: 59 | Admitting: Internal Medicine

## 2023-02-03 ENCOUNTER — Other Ambulatory Visit: Payer: Self-pay

## 2023-02-03 DIAGNOSIS — I252 Old myocardial infarction: Secondary | ICD-10-CM | POA: Diagnosis not present

## 2023-02-03 DIAGNOSIS — L97522 Non-pressure chronic ulcer of other part of left foot with fat layer exposed: Secondary | ICD-10-CM

## 2023-02-03 DIAGNOSIS — E11621 Type 2 diabetes mellitus with foot ulcer: Secondary | ICD-10-CM | POA: Diagnosis not present

## 2023-02-03 DIAGNOSIS — Z89422 Acquired absence of other left toe(s): Secondary | ICD-10-CM

## 2023-02-03 DIAGNOSIS — N189 Chronic kidney disease, unspecified: Secondary | ICD-10-CM | POA: Diagnosis not present

## 2023-02-03 DIAGNOSIS — E1122 Type 2 diabetes mellitus with diabetic chronic kidney disease: Secondary | ICD-10-CM | POA: Diagnosis not present

## 2023-02-03 DIAGNOSIS — I5042 Chronic combined systolic (congestive) and diastolic (congestive) heart failure: Secondary | ICD-10-CM | POA: Diagnosis not present

## 2023-02-03 DIAGNOSIS — I130 Hypertensive heart and chronic kidney disease with heart failure and stage 1 through stage 4 chronic kidney disease, or unspecified chronic kidney disease: Secondary | ICD-10-CM | POA: Diagnosis not present

## 2023-02-03 DIAGNOSIS — Z794 Long term (current) use of insulin: Secondary | ICD-10-CM | POA: Diagnosis not present

## 2023-02-03 DIAGNOSIS — Z7984 Long term (current) use of oral hypoglycemic drugs: Secondary | ICD-10-CM | POA: Diagnosis not present

## 2023-02-03 DIAGNOSIS — E1140 Type 2 diabetes mellitus with diabetic neuropathy, unspecified: Secondary | ICD-10-CM | POA: Diagnosis not present

## 2023-02-03 DIAGNOSIS — E785 Hyperlipidemia, unspecified: Secondary | ICD-10-CM | POA: Diagnosis not present

## 2023-02-03 DIAGNOSIS — I2510 Atherosclerotic heart disease of native coronary artery without angina pectoris: Secondary | ICD-10-CM | POA: Diagnosis not present

## 2023-02-03 MED ORDER — LEVOFLOXACIN 750 MG PO TABS
750.0000 mg | ORAL_TABLET | Freq: Every day | ORAL | 0 refills | Status: DC
  Filled 2023-02-03 (×2): qty 7, 7d supply, fill #0

## 2023-02-04 ENCOUNTER — Ambulatory Visit: Payer: 59 | Admitting: Critical Care Medicine

## 2023-02-05 NOTE — Progress Notes (Signed)
KEVAN, SIBBALD ([id number]) [id number] Page 1 of 10 Visit Report for 02/03/2023 Chief Complaint Document Details Patient Name: Date of Service: [name], [place] RO LD 02/03/2023 10:15 A M Medical Record Number: [id number] Patient Account Number: [id number] Date of Birth/Sex: Treating RN: 08-29-1971 (52 y.o. M) Primary Care Provider: Asencion Noble Other Clinician: Referring Provider: Treating Provider/Extender: Madelynn Done in Treatment: 1 Information Obtained from: Patient Chief Complaint 01/15/2023; left foot wound Electronic Signature(s) Signed: 02/03/2023 1:21:38 PM By: Kalman Shan DO Entered By: Kalman Shan on 02/03/2023 11:11:26 -------------------------------------------------------------------------------- Debridement Details Patient Name: Date of Service: Glenn Gauze [place], [name] 02/03/2023 10:15 A M Medical Record Number: [id number] Patient Account Number: [id number] Date of Birth/Sex: Treating RN: 16-Jan-1971 (52 y.o. Lorette Ang, Meta.Reding Primary Care Provider: Asencion Noble Other Clinician: Referring Provider: Treating Provider/Extender: Madelynn Done in Treatment: 1 Debridement Performed for Assessment: Wound #3 Left,Plantar Foot Performed By: Physician Kalman Shan, DO Debridement Type: Debridement Severity of Tissue Pre Debridement: Fat layer exposed Level of Consciousness (Pre-procedure): Awake and Alert Pre-procedure Verification/Time Out Yes - 10:50 Taken: Start Time: 10:51 Pain Control: Lidocaine 4% T opical Solution T Area Debrided (L x W): otal 3 (cm) x 3 (cm) = 9 (cm) Tissue and other material debrided: Viable, Non-Viable, Callus, Slough, Subcutaneous, Skin: Dermis , Skin: Epidermis, Slough Level: Skin/Subcutaneous Tissue Debridement Description: Excisional Instrument: Curette Bleeding: None Hemostasis Achieved: Pressure End Time: 11:01 Procedural Pain: 0 Post  Procedural Pain: 0 Response to Treatment: Procedure was tolerated well Level of Consciousness (Post- Awake and Alert procedure): Post Debridement Measurements of Total Wound Length: (cm) 2 Width: (cm) 2 Depth: (cm) 0.1 Volume: (cm) 0.314 Character of Wound/Ulcer Post Debridement: Improved Severity of Tissue Post Debridement: Fat layer exposed [name] ([id number]) [id number] Page 2 of 10 Post Procedure Diagnosis Same as Pre-procedure Electronic Signature(s) Signed: 02/03/2023 1:21:38 PM By: Kalman Shan DO Signed: 02/04/2023 6:17:04 PM By: Deon Pilling RN, BSN Entered By: Deon Pilling on 02/03/2023 11:01:23 -------------------------------------------------------------------------------- HPI Details Patient Name: Date of Service: Glenn Gauze [place], [name] 02/03/2023 10:15 A M Medical Record Number: [id number] Patient Account Number: [id number] Date of Birth/Sex: Treating RN: [birth year] (52 y.o. M) Primary Care Provider: Asencion Noble Other Clinician: Referring Provider: Treating Provider/Extender: Madelynn Done in Treatment: 1 History of Present Illness HPI Description: ADMISSION 01/11/2020 This is a 52 year old man with uncontrolled type 2 diabetes with a recent hemoglobin A1c earlier this year of 13.4. He is on insulin and glipizide. He does not take his blood sugars at home. He does have a follow-up with primary care later this month I believe on January 27. He tells [place] that roughly a month ago he was walking with a shoe with a hole in his foot. He took the shoe off and there was an open wound at roughly the left fourth met head. This has significant undermining and raised edges. He has not noticed any purulence he does not feel unwell. More recently he was taking skin off the bottom of his foot and has a superficial area on the left fifth met head. He has not been offloading this. The patient was in the ER on 12/20. They  gave him Bactroban which she has been using on the wound and 10 days worth of doxycycline. No x-rays were done. He has not had vascular studies. He is also been using hydrogen peroxide. Past medical history type 2 diabetes uncontrolled, chronic systolic heart failure, coronary artery disease with a history  of congestive heart failure with stents. Hypertension hyperlipidemia and chronic renal insufficiency ABI in this clinic was 1.14 on the left. 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 He is uncertain whether he would be able to work if we put him on some form of restriction 1/19; he is generally doing quite well. Using silver alginate on the wounds. Things actually look better. He has a forefoot offloading boot which she seems to be compliant about. He has support at work to stay off his foot is much as possible which is gratifying. Culture I did last week showed a few Enterococcus faecalis. I am going to put him on Augmentin. I talked about ordering an x-ray in my note last week but that does not seem to have happened. We will review reorder the x-ray this week. 1/26; x-ray reordered last week was negative for osteomyelitis. We are using silver alginate on the wound on the third and fifth met heads on the left. He is using a Darco forefoot off loader 2/2; the area on the fifth met head is closed. Third met head is still open with tunneling depth and thick callus. 2/9; the area on the fifth met head remains closed however the third met head again has a small open area on presentation with tunneling in depth and surrounded by thick callus. This looks like a pressure issue. We have been using silver alginate 2/16; the area of the fifth metatarsal head remains closed however the area over the third metatarsal head again is a small open area but with some depth. I do not think this is changed much since last week. He is using Hydrofera Blue with forefoot off loader. He is  not able to use a total contact cast on the left leg because he needs his left leg at work [employer]]. Fortunately the wound does not look infected. I changed him to endoform today 2/26; the area of the fifth metatarsal head remains closed. The area of the third metatarsal head has an even smaller opening this time. I used endoform on this this looks improved. He is offloading this is much as he can and a forefoot off loader on the left. He cannot have a total contact cast because of work responsibilities 3/5; the area on the fifth metatarsal head remains closed the area on the third metatarsal head is also closed on the left foot. 01/15/2023 Mr. 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 He has been following for podiatry for several months for this issue. He is currently using wet-to-dry saline dressings. He currently denies signs of infection. Progresse note from 1/17; Patient presents with a 62-month history of nonhealing ulcer to the left foot secondary to diabetes and inability to offload well the area. He has had multiple debridements in the past to his feet bilaterally. He has had resection of the fourth left met head in the fifth toe Secondary to osteomyelitis. We discussed the importance of glycemic control for wound healing. Due to his blood glucose levels being elevated he is at high risk for infection and thus further amputation. He expressed understanding. He states he is supposed to be referred to  an endocrinologist at [facility] however the referral fell through. Unclear what happened. Offered a referral to endocrinology at [facility]. Patient was  agreeable.Furthermore we discussed the importance of aggressive offloading for his wound healing. This will be the most difficult part of the treatment plan for the patient to do. We discussed a total contact cast however he has declined that at this time. 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 He is currently using a surgical shoe with a peg assist. It does not fit well so we will give him a new one today. I do not think this is enough offloading. If he is not able to offload this area he will likely end up with a BKA. He is well aware of this. For now I recommended Medihoney and Hydrofera Blue for dressing changes. He will follow-up in our [place] office Since this is closer for him. 1/26; patient presents for follow-up. Last clinic visit I had seen him in [place] and we transferred him to [place] since this is a closer location for him. Progress note above from that visit. Over the past week He has been using Medihoney and Hydrofera Blue to the wound bed. He has been using his surgical shoe with peg assist. He has no issues or complaints today. We discussed doing the total contact cast and he was agreeable to having this placed at next DEMETRIOUS, RAINFORD ([id number]) [phone].pdf Page 3 of 10 clinic visit. 2/2; patient presents for follow-up. He has been using Vashe wet-to-dry dressings. Plan is for the total contact cast today. 2/5; patient presents for follow-up. The cast was placed 3 days ago. He tolerated this well however had a lot of drainage. He currently denies signs of infection. Electronic Signature(s) Signed: 02/03/2023 1:21:38 PM By: Kalman Shan DO Entered By: Kalman Shan on 02/03/2023 11:11:59 -------------------------------------------------------------------------------- Physical Exam Details Patient Name: Date of Service: Glenn Gauze [place], [name] 02/03/2023 10:15 A  M Medical Record Number: [id number] Patient Account Number: [id number] Date of Birth/Sex: Treating RN: November 18, 1971 (52 y.o. M) Primary Care Provider: Asencion Noble Other Clinician: Referring Provider: Treating Provider/Extender: Madelynn Done in Treatment: 1 Constitutional respirations regular, non-labored and within target range for patient.. Cardiovascular 2+ dorsalis pedis/posterior tibialis pulses. Psychiatric pleasant and cooperative. Notes Left foot: T the lateral aspect, fifth met head there is an open wound with granulation tissue, Slough and devitalized tissue. No signs of infection including o increased warmth, erythema or purulent drainage. Electronic Signature(s) Signed: 02/03/2023 1:21:38 PM By: Kalman Shan DO Entered By: Kalman Shan on 02/03/2023 11:12:43 -------------------------------------------------------------------------------- Physician Orders Details Patient Name: Date of Service: Glenn Gauze [place], [name] 02/03/2023 10:15 A M Medical Record Number: [id number] Patient Account Number: [id number] Date of Birth/Sex: Treating RN: 07-06-1971 (52 y.o. [name] Primary Care Provider: Asencion Noble Other Clinician: Referring Provider: Treating Provider/Extender: Madelynn Done in Treatment: 1 Verbal / Phone Orders: No Diagnosis Coding ICD-10 Coding Code Description [phone] 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.621 Type 2 diabetes mellitus with foot ulcer Z89.422 Acquired absence of other left toe(s) Follow-up Appointments ppointment in 1 week. - Keep appt with Dr. Heber [place] Friday 02/07/23 @ 0845 Rm # 7 (already has appt.) Return A ***TCC*** RONELLE, SMALLMAN ([id number]) [id number] Page 4 of 10 ppointment in: - Monday 315pm 02/10/2023 Dr. Heber  overflow room. Return A Other: -  Pick up oral antibiotics. Anesthetic (In clinic)  Topical Lidocaine 5% applied to wound bed Cellular or Tissue Based Products Cellular or Tissue Based Product Type: - apligraf and grafix 50% coinsurance Bathing/ Shower/ Hygiene May shower with protection but do not get wound dressing(s) wet. Protect dressing(s) with water repellant cover (for example, large plastic bag) or a cast cover and may then take shower. Off-Loading Total Contact Cast to Left Lower Extremity - HOLD today 02/03/2023 Other: - keep pressure off left lateral foot Wound Treatment Wound #3 - Foot Wound Laterality: Plantar, Left Cleanser: Wound Cleanser 1 x Per Day/15 Days Discharge Instructions: Cleanse the wound with wound cleanser prior to applying a clean dressing using gauze sponges, not tissue or cotton balls. Peri-Wound Care: Zinc Oxide Ointment 30g tube 1 x Per Day/15 Days Discharge Instructions: Apply Zinc Oxide to periwound with each dressing change Prim Dressing: Vashe 1 x Per Day/15 Days ary Discharge Instructions: apply directly to wound bed. Secondary Dressing: ABD Pad, 5x9 1 x Per Day/15 Days Discharge Instructions: Apply over primary dressing as directed. Secured With: [employer], 4.5x3.1 (in/yd) 1 x Per Day/15 Days Discharge Instructions: Secure with Kerlix as directed. Secured With: 59M Medipore H Soft Cloth Surgical T ape, 4 x 10 (in/yd) 1 x Per Day/15 Days Discharge Instructions: Secure with tape as directed. Patient Medications llergies: Jardiance A Notifications Medication Indication Start End 02/03/2023 levofloxacin DOSE 1 - oral 750 mg tablet - 1 tablet oral once daily x 7 days Electronic Signature(s) Signed: 02/03/2023 1:21:38 PM By: Kalman Shan DO Previous Signature: 02/03/2023 11:22:18 AM Version By: Kalman Shan DO Entered By: Kalman Shan on 02/03/2023 11:22:45 -------------------------------------------------------------------------------- Problem List Details Patient Name: Date of Service: Glenn Gauze [place], [name]  02/03/2023 10:15 A M Medical Record Number: [id number] Patient Account Number: [id number] Date of Birth/Sex: Treating RN: 06/23/1971 (52 y.o. [name] Primary Care Provider: Asencion Noble Other Clinician: Referring Provider: Treating Provider/Extender: Madelynn Done in Treatment: 1 Active Problems ICD-10 Encounter Code Description Active Date MDM Diagnosis [phone] Non-pressure chronic ulcer of other part of left foot with fat layer exposed 01/24/2023 No Yes AYAAN, SHUTES ([id number]) [id number] Page 5 of 10 E11.621 Type 2 diabetes mellitus with foot ulcer 01/24/2023 No Yes Z89.422 Acquired absence of other left toe(s) 01/24/2023 No Yes Inactive Problems Resolved Problems Electronic Signature(s) Signed: 02/03/2023 1:21:38 PM By: Kalman Shan DO Entered By: Kalman Shan on 02/03/2023 11:11:06 -------------------------------------------------------------------------------- Progress Note Details Patient Name: Date of Service: Glenn Gauze [place], [name] 02/03/2023 10:15 A M Medical Record Number: [id number] Patient Account Number: [id number] Date of Birth/Sex: Treating RN: November 05, 1971 (52 y.o. M) Primary Care Provider: Asencion Noble Other Clinician: Referring Provider: Treating Provider/Extender: Madelynn Done in Treatment: 1 Subjective Chief Complaint Information obtained from Patient 01/15/2023; left foot wound History of Present Illness (HPI) ADMISSION 01/11/2020 This is a 52 year old man with uncontrolled type 2 diabetes with a recent hemoglobin A1c earlier this year of 13.4. He is on insulin and glipizide. He does not take his blood sugars at home. He does have a follow-up with primary care later this month I believe on January 27. He tells [place] that roughly a month ago he was walking with a shoe with a hole in his foot. He took the shoe off and there was an open wound at roughly the left fourth  met head. This has significant undermining and raised edges. He has not noticed any purulence he does not feel unwell. More  recently he was taking skin off the bottom of his foot and has a superficial area on the left fifth met head. He has not been offloading this. The patient was in the ER on 12/20. They gave him Bactroban which she has been using on the wound and 10 days worth of doxycycline. No x-rays were done. He has not had vascular studies. He is also been using hydrogen peroxide. Past medical history type 2 diabetes uncontrolled, chronic systolic heart failure, coronary artery disease with a history of congestive heart failure with stents. Hypertension hyperlipidemia and chronic renal insufficiency ABI in this clinic was 1.14 on the left. 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 He is uncertain whether he would be able to work if we put him on some form of restriction 1/19; he is generally doing quite well. Using silver alginate on the wounds. Things actually look better. He has a forefoot offloading boot which she seems to be compliant about. He has support at work to stay off his foot is much as possible which is gratifying. Culture I did last week showed a few Enterococcus faecalis. I am going to put him on Augmentin. I talked about ordering an x-ray in my note last week but that does not seem to have happened. We will review reorder the x-ray this week. 1/26; x-ray reordered last week was negative for osteomyelitis. We are using silver alginate on the wound on the third and fifth met heads on the left. He is using a Darco forefoot off loader 2/2; the area on the fifth met head is closed. Third met head is still open with tunneling depth and thick callus. 2/9; the area on the fifth met head remains closed however the third met head again has a small open area on presentation with tunneling in depth and surrounded by thick callus. This looks like a pressure  issue. We have been using silver alginate 2/16; the area of the fifth metatarsal head remains closed however the area over the third metatarsal head again is a small open area but with some depth. I do not think this is changed much since last week. He is using Hydrofera Blue with forefoot off loader. He is not able to use a total contact cast on the left leg because he needs his left leg at work [employer]]. Fortunately the wound does not look infected. I changed him to endoform today 2/26; the area of the fifth metatarsal head remains closed. The area of the third metatarsal head has an even smaller opening this time. I used endoform on this this looks improved. He is offloading this is much as he can and a forefoot off loader on the left. He cannot have a total contact cast because of work responsibilities 3/5; the area on the fifth metatarsal head remains closed the area on the third metatarsal head is also closed on the left foot. 01/15/2023 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 He has been following for podiatry for several months for this issue. He is currently using wet-to-dry saline dressings. He currently denies signs of infection. Progresse  note from 1/17; Patient presents with a 59-month history of nonhealing ulcer to the left foot secondary to diabetes and inability to offload well the area. He has had multiple debridements in the past to his feet bilaterally. He has had resection of the fourth left met head in the fifth toe Secondary  to osteomyelitis. We discussed the importance of glycemic control for wound healing. Due to his blood glucose levels being elevated he is at high risk for infection and thus further amputation. He expressed understanding. He states he is supposed to be referred to an endocrinologist at [name] however the referral fell through. Unclear what happened. Offered a referral to [facility]ocrinology at [facility]. Patient was agreeable.Furthermore we discussed the importance of aggressive offloading for his wound healing. This will be the most difficult part of the treatment plan for the patient to do. We discussed a total contact cast however he has declined that at this time. 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 He is currently using a surgical shoe with a peg assist. It does not fit well so we will give him a new one today. I do not think this is enough offloading. If he is not able to offload this area he will likely end up with a BKA. He is well aware of this. For now I recommended Medihoney and Hydrofera Blue for dressing changes. He will follow-up in our [place] office Since this is closer for him. 1/26; patient presents for follow-up. Last clinic visit I had seen him in [place] and we transferred him to [place] since this is a closer location for him. Progress note above from that visit. Over the past week He has been using Medihoney and Hydrofera Blue to the wound bed. He has been using his surgical shoe with peg assist. He has no issues or complaints today. We discussed doing the total contact cast and he was agreeable to having this placed at next clinic visit. 2/2; patient presents for follow-up. He has been using Vashe wet-to-dry dressings. Plan is for the total contact cast today. 2/5; patient presents for follow-up. The cast was placed 3 days ago. He tolerated this well however had a lot of drainage. He currently denies  signs of infection. Patient History Information obtained from Patient. Family History Heart Disease - Mother,Father, No family history of Cancer, Diabetes, Hereditary Spherocytosis, Hypertension, Kidney Disease, Lung Disease, Seizures, Stroke, Thyroid Problems, Tuberculosis. Social History Never smoker, Marital Status - Single, Alcohol Use - Rarely, Drug Use - No History, Caffeine Use - Rarely. 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 63 Denies history of Angina, Arrhythmia, Deep Vein Thrombosis, Hypotension, Peripheral Arterial Disease, Peripheral Venous Disease, Phlebitis, Vasculitis Gastrointestinal Denies history of Cirrhosis , Colitis, Crohnoos, Hepatitis A, Hepatitis B, Hepatitis C Endocrine Patient has history of Type II Diabetes Genitourinary Denies history of End Stage Renal Disease Immunological Denies history of Lupus Erythematosus, Raynaudoos, Scleroderma Integumentary (Skin) Denies history of History of Burn Musculoskeletal Denies history of Gout, Rheumatoid Arthritis, Osteoarthritis, Osteomyelitis Neurologic Patient has history of Neuropathy  Denies history of Dementia, Quadriplegia, Paraplegia, Seizure Disorder Oncologic Denies history of Received Chemotherapy, Received Radiation Psychiatric Denies history of Anorexia/bulimia, Confinement Anxiety Hospitalization/Surgery History - Heart Cath in 2018. Medical A Surgical History  Notes nd Cardiovascular Ischemic Cardiomyopathy Genitourinary Renal Insufficiency Objective Constitutional respirations regular, non-labored and within target range for patient.[name]. Cammie McgeeFOUNTAIN, Yang ([id number]) [id number] Page 7 of 10 Vitals Time Taken: 10:30 AM, Height: 78 in, Weight: 275 lbs, BMI: 31.8, Temperature: 98.1 F, Pulse: 93 bpm, Respiratory Rate: 18 breaths/min, Blood Pressure: 137/77 mmHg. Cardiovascular 2+ dorsalis pedis/posterior tibialis pulses. Psychiatric pleasant and cooperative. General Notes: Left foot: T the lateral aspect, fifth met head there is an open wound with granulation tissue, Slough and devitalized tissue. No signs of o infection including increased warmth, erythema or purulent drainage. Integumentary (Hair, Skin) Wound #3 status is Open. Original cause of wound was Gradually Appeared. The date acquired was: 10/01/2022. The wound has been in treatment 1 weeks. The wound is located on the Left,Plantar Foot. The wound measures 2cm length x 2cm width x 0.2cm depth; 3.142cm^2 area and 0.628cm^3 volume. There is Fat Layer (Subcutaneous Tissue) exposed. There is no tunneling or undermining noted. There is a medium amount of serosanguineous drainage noted. The wound margin is distinct with the outline attached to the wound base. There is large (67-100%) red granulation within the wound bed. There is no necrotic tissue within the wound bed. The periwound skin appearance exhibited: Callus, Dry/Scaly. The periwound skin appearance did not exhibit: Crepitus, Excoriation, Induration, Rash, Scarring, Maceration, Atrophie Blanche, Cyanosis, Ecchymosis, Hemosiderin Staining, Mottled, Pallor, Rubor, Erythema. 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 However he had a lot of drainage and there was a mild odor on exam. At this time I recommended an oral antibiotic as the wound surface is likely infected causing the drainage. No soft tissue infection. For now we will take a break from the cast. I will see him at the end of the week and we will recast him pending exam. For now I recommended Vashe wet-to-dry dressings along with aggressive offloading with his surgical shoe and peg assist. Procedures Wound #3 Pre-procedure diagnosis of Wound #3 is a Diabetic Wound/Ulcer of the Lower Extremity located on the Left,Plantar Foot .Severity of Tissue Pre Debridement is: Fat layer exposed. There was a Excisional Skin/Subcutaneous Tissue Debridement with a total area of 9 sq cm performed by Geralyn CorwinHoffman, Sigourney Portillo, DO. With the following instrument(s): Curette to remove Viable and Non-Viable tissue/material. Material removed includes Callus, Subcutaneous Tissue, Slough, Skin: Dermis, and Skin: Epidermis after achieving pain control using Lidocaine 4% T opical Solution. A time out was conducted at 10:50, prior to the start of the procedure. There was no bleeding. The procedure was tolerated well with a pain level of 0 throughout and a pain level of 0 following the procedure. Post Debridement Measurements: 2cm length x 2cm width x 0.1cm depth; 0.314cm^3 volume. Character of Wound/Ulcer Post Debridement is improved. Severity of Tissue Post Debridement is: Fat layer exposed. Post procedure Diagnosis Wound #3: Same as Pre-Procedure Plan Follow-up Appointments: Return Appointment in 1 week. - Keep appt with Dr. Mikey BussingHoffman Friday 02/07/23 @ 0845 Rm # 7 (already has appt.) ***T[facility]*** Return Appointment in: - Monday 315pm 02/10/2023 Dr. Mikey BussingHoffman overflow room. Other: - Pick up oral antibiotics. Anesthetic: (In clinic)  Topical Lidocaine 5% applied to wound bed Cellular or Tissue Based Products: Cellular or Tissue Based Product Type: - apligraf and grafix 50%  coinsurance Bathing/ Shower/ Hygiene: May shower with protection but do not get wound dressing(s) wet. Protect dressing(s) with water repellant cover (for example, large plastic bag) or a cast cover and may then take shower. Off-Loading: T Contact Cast to Left Lower Extremity - HOLD today 02/03/2023 otal Other: - keep pressure off left lateral foot The following medication(s) was prescribed: levofloxacin oral 750 mg tablet 1 1 tablet oral once daily x 7 days starting 02/03/2023 WOUND #3: - Foot Wound Laterality: Plantar, Left Cleanser: Wound Cleanser 1 x Per Day/15 Days Discharge Instructions: Cleanse the wound with wound cleanser prior to applying a clean dressing using gauze sponges, not tissue or cotton balls. Peri-Wound Care: Zinc Oxide Ointment 30g tube 1 x Per Day/15 Days Discharge Instructions: Apply Zinc Oxide to periwound with each dressing change Prim Dressing: Vashe 1 x Per Day/15 Days ary Discharge Instructions: apply directly to wound bed. Secondary Dressing: ABD Pad, 5x9 1 x Per Day/15 Days NTHONY, LEFFERTS ([id number]) [phone].pdf Page 8 of 10 Discharge Instructions: Apply over primary dressing as directed. Secured With: [employer], 4.5x3.1 (in/yd) 1 x Per Day/15 Days Discharge Instructions: Secure with Kerlix as directed. Secured With: 87M Medipore H Soft Cloth Surgical T ape, 4 x 10 (in/yd) 1 x Per Day/15 Days Discharge Instructions: Secure with tape as directed. 1. In office sharp debridement 2. Vashe wet-to-dry dressings 3. Aggressive offloadingoosurgical shoe with peg assist 4. Follow-up in 1 week Electronic Signature(s) Signed: 02/03/2023 1:21:38 PM By: Kalman Shan DO Entered By: Kalman Shan on 02/03/2023 11:23:07 -------------------------------------------------------------------------------- HxROS Details Patient Name: Date of Service: Glenn Gauze [place], [name] 02/03/2023 10:15 A M Medical Record Number: [id number] Patient  Account Number: [id number] Date of Birth/Sex: Treating RN: Jan 29, 1971 (52 y.o. M) Primary Care Provider: Asencion Noble Other Clinician: Referring Provider: Treating Provider/Extender: Madelynn Done in Treatment: 1 Information Obtained From Patient Eyes Medical History: Negative for: Cataracts; Glaucoma; Optic Neuritis Ear/Nose/Mouth/Throat Medical History: Positive for: Chronic sinus problems/congestion Negative for: Middle ear problems Hematologic/Lymphatic Medical History: Negative for: Anemia; Hemophilia; Human Immunodeficiency Virus; Lymphedema; Sickle Cell Disease Respiratory Medical History: Negative for: Aspiration; Asthma; Chronic Obstructive Pulmonary Disease (COPD); Pneumothorax; Sleep Apnea; Tuberculosis Cardiovascular Medical History: Positive for: Congestive Heart Failure; Coronary Artery Disease; Hypertension; Myocardial Infarction - age 58 Negative for: Angina; Arrhythmia; Deep Vein Thrombosis; Hypotension; Peripheral Arterial Disease; Peripheral Venous Disease; Phlebitis; Vasculitis Past Medical History Notes: Ischemic Cardiomyopathy Gastrointestinal Medical History: Negative for: Cirrhosis ; Colitis; Crohns; Hepatitis A; Hepatitis B; Hepatitis C Endocrine Medical History: Positive for: Type II Diabetes Time with diabetes: ten years RAYMAR, JOINER ([id number]) [phone].pdf Page 9 of 10 Treated with: Insulin Blood sugar tested every day: No Genitourinary Medical History: Negative for: End Stage Renal Disease Past Medical History Notes: Renal Insufficiency Immunological Medical History: Negative for: Lupus Erythematosus; Raynauds; Scleroderma Integumentary (Skin) Medical History: Negative for: History of Burn Musculoskeletal Medical History: Negative for: Gout; Rheumatoid Arthritis; Osteoarthritis; Osteomyelitis Neurologic Medical History: Positive for: Neuropathy Negative for: Dementia;  Quadriplegia; Paraplegia; Seizure Disorder Oncologic Medical History: Negative for: Received Chemotherapy; Received Radiation Psychiatric Medical History: Negative for: Anorexia/bulimia; Confinement Anxiety HBO Extended History Items Ear/Nose/Mouth/Throat: Chronic sinus problems/congestion Immunizations Pneumococcal Vaccine: Received Pneumococcal Vaccination: No Implantable Devices None Hospitalization / Surgery History Type of Hospitalization/Surgery Heart Cath in 2018 Family and Social History Cancer: No; Diabetes: No; Heart Disease: Yes - Mother,Father; Hereditary Spherocytosis:  No; Hypertension: No; Kidney Disease: No; Lung Disease: No; Seizures: No; Stroke: No; Thyroid Problems: No; Tuberculosis: No; Never smoker; Marital Status - Single; Alcohol Use: Rarely; Drug Use: No History; Caffeine Use: Rarely; Financial Concerns: No; Food, Clothing or Shelter Needs: No; Support System Lacking: No; Transportation Concerns: No Electronic Signature(s) Signed: 02/03/2023 1:21:38 PM By: Kalman Shan DO Entered By: Kalman Shan on 02/03/2023 11:12:11 SuperBill Details -------------------------------------------------------------------------------- [name] ([id number]) [id number] Page 10 of 10 Patient Name: Date of Service: Glenn Gauze [place], [place] RO LD 02/03/2023 Medical Record Number: [id number] Patient Account Number: [id number] Date of Birth/Sex: Treating RN: 05/14/1971 (52 y.o. Lorette Ang, Meta.Reding Primary Care Provider: Asencion Noble Other Clinician: Referring Provider: Treating Provider/Extender: Madelynn Done in Treatment: 1 Diagnosis Coding ICD-10 Codes Code Description [phone] 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.621 Type 2 diabetes mellitus with foot ulcer Z89.422 Acquired absence of other left toe(s) Facility Procedures CPT4 Code Description Modifier Quantity [id number]  [id number] - DEB SUBQ TISSUE 20 SQ CM/< 1 ICD-10 Diagnosis Description L97.522 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.621 Type 2 diabetes mellitus with foot ulcer Physician Procedures Quantity CPT4 Code Description Modifier [id number] - WC PHYS LEVEL 3 - EST PT 1 ICD-10 Diagnosis Description L97.522 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.621 Type 2 diabetes mellitus with foot ulcer Z89.422 Acquired absence of other left toe(s) [id number] 11042 - WC PHYS SUBQ TISS 20 SQ CM 1 ICD-10 Diagnosis Description L97.522 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.621 Type 2 diabetes mellitus with foot ulcer Electronic Signature(s) Signed: 02/03/2023 1:21:38 PM By: Kalman Shan DO Entered By: Kalman Shan on 02/03/2023 11:23:12

## 2023-02-05 NOTE — Progress Notes (Signed)
[name] ([id number]) [id number] Page 1 of 8 Visit Report for 02/03/2023 Arrival Information Details Patient Name: Date of Service: [name] [name], [name] RO LD 02/03/2023 10:15 A M Medical Record Number: [id number] Patient Account Number: [id number] Date of Birth/Sex: Treating RN: Nov 01, 1971 (52 y.o. M) Primary Care Jakhi Dishman: [name] Other Clinician: Referring Erminia Mcnew: Treating Keyleigh Manninen/Extender: [name] in Treatment: 1 Visit Information History Since Last Visit Added or deleted any medications: No Patient Arrived: Ambulatory Any new allergies or adverse reactions: No Arrival Time: 10:30 Had a fall or experienced change in No Accompanied By: self activities of daily living that may affect Transfer Assistance: None risk of falls: Patient Identification Verified: Yes Signs or symptoms of abuse/neglect since last No Secondary Verification Process Completed: Yes visito Patient Requires Transmission-Based Precautions: No Hospitalized since last visit: No Patient Has Alerts: Yes Implantable device outside of the clinic No Patient Alerts: 08/2021 ABI:1.2 TBI 0.79 excluding cellular tissue based products placed in the center since last visit: Has Footwear/Offloading in Place as Yes Prescribed: Left: Removable Cast Walker/Walking Boot Pain Present Now: No Electronic Signature(s) Signed: 02/04/2023 6:17:04 PM By: Shawn Stall RN, BSN Entered By: Shawn Stall on 02/03/2023 11:47:10 -------------------------------------------------------------------------------- Encounter Discharge Information Details Patient Name: Date of Service: [name] [place], [name] 02/03/2023 10:15 A M Medical Record Number: [id number] Patient Account Number: [id number] Date of Birth/Sex: Treating RN: May 09, 1971 (52 y.o. [name] Primary Care Hiroto Saltzman: [name] Other Clinician: Referring Dock Baccam: Treating Cozy Veale/Extender: [name] in Treatment: 1 Encounter Discharge Information Items Post Procedure Vitals Discharge Condition: Stable Temperature (F): 98.1 Ambulatory Status: Ambulatory Pulse (bpm): 93 Discharge Destination: Home Respiratory Rate (breaths/min): 18 Transportation: Private Auto Blood Pressure (mmHg): 137/77 Accompanied By: self Schedule Follow-up Appointment: Yes Clinical Summary of Care: Electronic Signature(s) Signed: 02/04/2023 6:17:04 PM By: Shawn Stall RN, BSN Entered By: Shawn Stall on 02/03/2023 11:08:08 [name] ([id number]) [id number] Page 2 of 8 -------------------------------------------------------------------------------- Lower Extremity Assessment Details Patient Name: Date of Service: [name] [name], [name] RO LD 02/03/2023 10:15 A M Medical Record Number: [id number] Patient Account Number: [id number] Date of Birth/Sex: Treating RN: 1971-10-16 (52 y.o. M) Primary Care Christy Ehrsam: [name] Other Clinician: Referring Sejla Marzano: Treating Lennard Capek/Extender: [name] in Treatment: 1 Edema Assessment Assessed: [name]: No] [name]: No] Edema: [Left: N] [Right: o] Calf Left: Right: Point of Measurement: 43 cm From Medial Instep 38.5 cm Ankle Left: Right: Point of Measurement: 9 cm From Medial Instep 22 cm Electronic Signature(s) Signed: 02/03/2023 4:12:57 PM By: Thayer Dallas Entered By: Thayer Dallas on 02/03/2023 10:44:45 -------------------------------------------------------------------------------- Multi Wound Chart Details Patient Name: Date of Service: [name] [place], [name] 02/03/2023 10:15 A M Medical Record Number: [id number] Patient Account Number: [id number] Date of Birth/Sex: Treating RN: 1971/04/16 (52 y.o. M) Primary Care Xaidyn Kepner: [name] Other Clinician: Referring Louvinia Cumbo: Treating Jazia Faraci/Extender: [name] in Treatment: 1 Vital  Signs Height(in): 78 Pulse(bpm): 93 Weight(lbs): 275 Blood Pressure(mmHg): 137/77 Body Mass Index(BMI): 31.8 Temperature(F): 98.1 Respiratory Rate(breaths/min): 18 [3:Photos:] [N/A:N/A] Left, Plantar Foot N/A N/A Wound Location: Gradually Appeared N/A N/A Wounding Event: Diabetic Wound/Ulcer of the Lower N/A N/A Primary Etiology: Extremity Chronic sinus problems/congestion, N/A N/A Comorbid History: Congestive Heart Failure, Coronary [name] ([id number]) [id number] Page 3 of 8 Artery Disease, Hypertension, Myocardial Infarction, Type II Diabetes, Neuropathy 10/01/2022 N/A N/A Date Acquired: 1 N/A N/A Weeks of Treatment: Open N/A N/A Wound Status: No N/A N/A Wound Recurrence: 2x2x0.2 N/A N/A Measurements  L x W x D (cm) 3.142 N/A N/A A (cm) : rea 0.628 N/A N/A Volume (cm) : 20.00% N/A N/A % Reduction in A rea: 20.00% N/A N/A % Reduction in Volume: Grade 1 N/A N/A Classification: Medium N/A N/A Exudate A mount: Serosanguineous N/A N/A Exudate Type: red, brown N/A N/A Exudate Color: Distinct, outline attached N/A N/A Wound Margin: Large (67-100%) N/A N/A Granulation A mount: Red N/A N/A Granulation Quality: None Present (0%) N/A N/A Necrotic A mount: Fat Layer (Subcutaneous Tissue): Yes N/A N/A Exposed Structures: Fascia: No Tendon: No Muscle: No Joint: No Bone: No Small (1-33%) N/A N/A Epithelialization: Debridement - Excisional N/A N/A Debridement: Pre-procedure Verification/Time Out 10:50 N/A N/A Taken: Lidocaine 4% Topical Solution N/A N/A Pain Control: Callus, Subcutaneous, Slough N/A N/A Tissue Debrided: Skin/Subcutaneous Tissue N/A N/A Level: 9 N/A N/A Debridement A (sq cm): rea Curette N/A N/A Instrument: None N/A N/A Bleeding: Pressure N/A N/A Hemostasis A chieved: 0 N/A N/A Procedural Pain: 0 N/A N/A Post Procedural Pain: Procedure was tolerated well N/A N/A Debridement Treatment  Response: 2x2x0.1 N/A N/A Post Debridement Measurements L x W x D (cm) 0.314 N/A N/A Post Debridement Volume: (cm) Callus: Yes N/A N/A Periwound Skin Texture: Excoriation: No Induration: No Crepitus: No Rash: No Scarring: No Dry/Scaly: Yes N/A N/A Periwound Skin Moisture: Maceration: No Atrophie Blanche: No N/A N/A Periwound Skin Color: Cyanosis: No Ecchymosis: No Erythema: No Hemosiderin Staining: No Mottled: No Pallor: No Rubor: No Debridement N/A N/A Procedures Performed: Treatment Notes Wound #3 (Foot) Wound Laterality: Plantar, Left Cleanser Wound Cleanser Discharge Instruction: Cleanse the wound with wound cleanser prior to applying a clean dressing using [name] sponges, not tissue or cotton balls. Peri-Wound Care Zinc Oxide Ointment 30g tube Discharge Instruction: Apply Zinc Oxide to periwound with each dressing change Topical Primary Dressing Vashe Discharge Instruction: apply directly to wound bed. Secondary Dressing ABD Pad, 5x9 Discharge Instruction: Apply over primary dressing as directed. LUKEN, SHADOWENS ([id number]) [id number] Page 4 of 8 Secured With [employer], 4.5x3.1 (in/yd) Discharge Instruction: Secure with Kerlix as directed. 75M Medipore H Soft Cloth Surgical T ape, 4 x 10 (in/yd) Discharge Instruction: Secure with tape as directed. Compression Wrap Compression Stockings Add-Ons Electronic Signature(s) Signed: 02/03/2023 1:21:38 PM By: Kalman Shan DO Entered By: Kalman Shan on 02/03/2023 11:11:10 -------------------------------------------------------------------------------- Multi-Disciplinary Care Plan Details Patient Name: Date of Service: [name] [place], [name] 02/03/2023 10:15 A M Medical Record Number: [id number] Patient Account Number: [id number] Date of Birth/Sex: Treating RN: 05/25/1971 (52 y.o. Lorette Ang, Tammi Klippel Primary Care Matthews Franks: Asencion Noble Other Clinician: Referring  Lewis Grivas: Treating Maretta Overdorf/Extender: Madelynn Done in Treatment: 1 Active Inactive Wound/Skin Impairment Nursing Diagnoses: Impaired tissue integrity Knowledge deficit related to ulceration/compromised skin integrity Goals: Patient will have a decrease in wound volume by X% from date: (specify in notes) Date Initiated: 01/24/2023 Target Resolution Date: 02/28/2023 Goal Status: Active Patient/caregiver will verbalize understanding of skin care regimen Date Initiated: 01/24/2023 Target Resolution Date: 02/28/2023 Goal Status: Active Ulcer/skin breakdown will have a volume reduction of 30% by week 4 Date Initiated: 01/24/2023 Target Resolution Date: 02/08/2023 Goal Status: Active Ulcer/skin breakdown will have a volume reduction of 50% by week 8 Date Initiated: 01/24/2023 Target Resolution Date: 02/08/2023 Goal Status: Active Interventions: Assess patient/caregiver ability to obtain necessary supplies Assess patient/caregiver ability to perform ulcer/skin care regimen upon admission and as needed Assess ulceration(s) every visit Notes: Electronic Signature(s) Signed: 02/04/2023 6:17:04 PM By: Deon Pilling RN, BSN Entered By: Deon Pilling on  02/03/2023 10:41:30 [name], [name] ([id number]) [id number] Page 5 of 8 -------------------------------------------------------------------------------- Pain Assessment Details Patient Name: Date of Service: [name] [place], [place] RO LD 02/03/2023 10:15 A M Medical Record Number: [id number] Patient Account Number: [id number] Date of Birth/Sex: Treating RN: [birth year] (52 y.o. M) Primary Care Adalynne Steffensmeier: Asencion Noble Other Clinician: Referring Kirstie Larsen: Treating Samad Thon/Extender: Madelynn Done in Treatment: 1 Active Problems Location of Pain Severity and Description of Pain Patient Has Paino No Site Locations Pain Management and Medication Current Pain  Management: Electronic Signature(s) Signed: 02/03/2023 4:12:57 PM By: Erenest Blank Entered By: Erenest Blank on 02/03/2023 10:31:11 -------------------------------------------------------------------------------- Patient/Caregiver Education Details Patient Name: Date of Service: [name] [place], [name] 2/5/2024andnbsp10:15 A M Medical Record Number: [id number] Patient Account Number: [id number] Date of Birth/Gender: Treating RN: December 05, 1971 (52 y.o. [name] Primary Care Physician: Asencion Noble Other Clinician: Referring Physician: Treating Physician/Extender: Madelynn Done in Treatment: 1 Education Assessment Education Provided To: Patient Education Topics Provided Wound/Skin Impairment: Handouts: Caring for Your Ulcer Methods: Explain/Verbal Responses: Reinforcements needed [name], [name] ([id number]) [id number] Page 6 of 8 Electronic Signature(s) Signed: 02/04/2023 6:17:04 PM By: Deon Pilling RN, BSN Entered By: Deon Pilling on 02/03/2023 10:41:41 -------------------------------------------------------------------------------- Wound Assessment Details Patient Name: Date of Service: [name] [place], [name] 02/03/2023 10:15 A M Medical Record Number: [id number] Patient Account Number: [id number] Date of Birth/Sex: Treating RN: 1971-04-19 (52 y.o. M) Primary Care Samarra Ridgely: Asencion Noble Other Clinician: Referring Allure Greaser: Treating Leydy Worthey/Extender: Madelynn Done in Treatment: 1 Wound Status Wound Number: 3 Primary Diabetic Wound/Ulcer of the Lower Extremity Etiology: Wound Location: Left, Plantar Foot Wound Open Wounding Event: Gradually Appeared Status: Date Acquired: 10/01/2022 Comorbid Chronic sinus problems/congestion, Congestive Heart Failure, Weeks Of Treatment: 1 History: Coronary Artery Disease, Hypertension, Myocardial Infarction, Type Clustered Wound: No II Diabetes,  Neuropathy Photos Wound Measurements Length: (cm) 2 Width: (cm) 2 Depth: (cm) 0.2 Area: (cm) 3.142 Volume: (cm) 0.628 % Reduction in Area: 20% % Reduction in Volume: 20% Epithelialization: Small (1-33%) Tunneling: No Undermining: No Wound Description Classification: Grade 1 Wound Margin: Distinct, outline attached Exudate Amount: Medium Exudate Type: Serosanguineous Exudate Color: red, brown Foul Odor After Cleansing: No Slough/Fibrino No Wound Bed Granulation Amount: Large (67-100%) Exposed Structure Granulation Quality: Red Fascia Exposed: No Necrotic Amount: None Present (0%) Fat Layer (Subcutaneous Tissue) Exposed: Yes Tendon Exposed: No Muscle Exposed: No Joint Exposed: No Bone Exposed: No Periwound Skin Texture Texture Color No Abnormalities Noted: No No Abnormalities Noted: No Callus: Yes Atrophie Blanche: No Crepitus: No Cyanosis: No [name], [name] ([id number]) [id number] Page 7 of 8 Excoriation: No Ecchymosis: No Induration: No Erythema: No Rash: No Hemosiderin Staining: No Scarring: No Mottled: No Pallor: No Moisture Rubor: No No Abnormalities Noted: No Dry / Scaly: Yes Maceration: No Treatment Notes Wound #3 (Foot) Wound Laterality: Plantar, Left Cleanser Wound Cleanser Discharge Instruction: Cleanse the wound with wound cleanser prior to applying a clean dressing using [name] sponges, not tissue or cotton balls. Peri-Wound Care Zinc Oxide Ointment 30g tube Discharge Instruction: Apply Zinc Oxide to periwound with each dressing change Topical Primary Dressing Vashe Discharge Instruction: apply directly to wound bed. Secondary Dressing ABD Pad, 5x9 Discharge Instruction: Apply over primary dressing as directed. Secured With [employer], 4.5x3.1 (in/yd) Discharge Instruction: Secure with Kerlix as directed. 34M Medipore H Soft Cloth Surgical T ape, 4 x 10 (in/yd) Discharge Instruction: Secure with  tape as directed. Compression Wrap Compression Stockings Add-Ons Electronic Signature(s) Signed: 02/03/2023 4:12:57 PM By:  Erenest Blank Entered By: Erenest Blank on 02/03/2023 10:46:21 -------------------------------------------------------------------------------- Vitals Details Patient Name: Date of Service: [name] [place], [place] RO LD 02/03/2023 10:15 A M Medical Record Number: [id number] Patient Account Number: [id number] Date of Birth/Sex: Treating RN: Sep 24, 1971 (52 y.o. M) Primary Care Karington Zarazua: Asencion Noble Other Clinician: Referring Lodema Parma: Treating Wilhelmina Hark/Extender: Madelynn Done in Treatment: 1 Vital Signs Time Taken: 10:30 Temperature (F): 98.1 Height (in): 78 Pulse (bpm): 93 Weight (lbs): 275 Respiratory Rate (breaths/min): 18 Body Mass Index (BMI): 31.8 Blood Pressure (mmHg): 137/77 Reference Range: 80 - 120 mg / dl Electronic Signature(s) Signed: 02/03/2023 4:12:57 PM By: Randie Heinz, Joneen Boers ([id number]) [id number] Page 8 of 8 Entered By: Erenest Blank on 02/03/2023 10:31:05

## 2023-02-07 ENCOUNTER — Encounter (HOSPITAL_BASED_OUTPATIENT_CLINIC_OR_DEPARTMENT_OTHER): Payer: 59 | Admitting: Internal Medicine

## 2023-02-07 DIAGNOSIS — E11621 Type 2 diabetes mellitus with foot ulcer: Secondary | ICD-10-CM | POA: Diagnosis not present

## 2023-02-07 DIAGNOSIS — Z89422 Acquired absence of other left toe(s): Secondary | ICD-10-CM

## 2023-02-07 DIAGNOSIS — L97522 Non-pressure chronic ulcer of other part of left foot with fat layer exposed: Secondary | ICD-10-CM | POA: Diagnosis not present

## 2023-02-07 NOTE — Progress Notes (Signed)
KURTUS, VIOLET ([id number]) [id number] Page 1 of 9 Visit Report for 02/07/2023 Chief Complaint Document Details Patient Name: Date of Service: [name] [place], [place] RO LD 02/07/2023 8:45 A M Medical Record Number: [id number] Patient Account Number: [id number] Date of Birth/Sex: Treating RN: [birth year] (52 y.o. M) Primary Care Provider: Asencion Noble Other Clinician: Referring Provider: Treating Provider/Extender: Madelynn Done in Treatment: 2 Information Obtained from: Patient Chief Complaint 01/15/2023; left foot wound Electronic Signature(s) Signed: 02/07/2023 12:26:02 PM By: Kalman Shan DO Entered By: Kalman Shan on 02/07/2023 09:23:05 -------------------------------------------------------------------------------- HPI Details Patient Name: Date of Service: [name] [place], [name] 02/07/2023 8:45 A M Medical Record Number: [id number] Patient Account Number: [id number] Date of Birth/Sex: Treating RN: 12-Jul-1971 (52 y.o. M) Primary Care Provider: Asencion Noble Other Clinician: Referring Provider: Treating Provider/Extender: Madelynn Done in Treatment: 2 History of Present Illness HPI Description: ADMISSION 01/11/2020 This is a 52 year old man with uncontrolled type 2 diabetes with a recent hemoglobin A1c earlier this year of 13.4. He is on insulin and glipizide. He does not take his blood sugars at home. He does have a follow-up with primary care later this month I believe on January 27. He tells [place] that roughly a month ago he was walking with a shoe with a hole in his foot. He took the shoe off and there was an open wound at roughly the left fourth met head. This has significant undermining and raised edges. He has not noticed any purulence he does not feel unwell. More recently he was taking skin off the bottom of his foot and has a superficial area on the left fifth met head. He has not been offloading  this. The patient was in the ER on 12/20. They gave him Bactroban which she has been using on the wound and 10 days worth of doxycycline. No x-rays were done. He has not had vascular studies. He is also been using hydrogen peroxide. Past medical history type 2 diabetes uncontrolled, chronic systolic heart failure, coronary artery disease with a history of congestive heart failure with stents. Hypertension hyperlipidemia and chronic renal insufficiency ABI in this clinic was 1.14 on the left. 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 He is uncertain whether he would be able to work if we put him on some form of restriction 1/19; he is generally doing quite well. Using silver alginate on the wounds. Things actually look better. He has a forefoot offloading boot which she seems to be compliant about. He has support at work to stay off his foot is much as possible which is gratifying. Culture I did last week showed a few Enterococcus faecalis. I am going to put him on Augmentin. I talked about ordering an x-ray in my note last week but that does not seem to have happened. We will review reorder the x-ray this week. 1/26; x-ray reordered last week was negative for osteomyelitis. We are using silver alginate on the wound on the third and fifth met heads on the left. He is using a Darco forefoot off loader 2/2; the area on the fifth met head is closed. Third met head is still open with tunneling depth and thick callus. 2/9; the area on the fifth met head remains closed however the third met head again has a small open area on presentation with tunneling in depth and surrounded by thick callus. This looks like a pressure issue. We have been using silver alginate  2/16; the area of the fifth metatarsal head remains closed however the area over the third metatarsal head again is a small open area but with some depth. I do not think this is changed much since last week. He is using  Hydrofera Blue with forefoot off loader. He is not able to use a total contact cast on the left leg because he needs his left leg at work [employer]]. Fortunately the wound does not look infected. I changed him to endoform today 2/26; the area of the fifth metatarsal head remains closed. The area of the third metatarsal head has an even smaller opening this time. I used endoform on [place] ([id number]) [id number] Page 2 of 9 this this looks improved. He is offloading this is much as he can and a forefoot off loader on the left. He cannot have a total contact cast because of work responsibilities 3/5; the area on the fifth metatarsal head remains closed the area on the third metatarsal head is also closed on the left foot. 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 He has been following for podiatry for several months for this issue. He is currently using wet-to-dry saline dressings. He currently denies signs of infection. Progresse note from 1/17; Patient presents with a 50-monthistory of nonhealing ulcer to the left foot secondary to diabetes and inability to offload well the area. He has had multiple debridements in the past to his feet bilaterally. He has had resection of the fourth left met head in the fifth toe Secondary to osteomyelitis. We discussed the importance of glycemic control for wound healing. Due to his blood glucose levels being elevated he is at high risk for infection and thus further amputation. He expressed understanding. He states [facility] Unclear what happened. [facility] we discussed the importance of aggressive offloading for his wound healing. This will be the most difficult part of the treatment plan for the patient to do. We discussed a total contact cast however he has declined that at this time. 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 He is currently using a surgical shoe with a peg assist. It does not fit well so we will give him a new one today. I do not think this is enough offloading. If he is not able to offload this area he will likely end up with a BKA. He is well aware of this. For now I recommended Medihoney and Hydrofera Blue for dressing changes. [place] 1/26; patient presents for follow-up. [place] Progress note above from that visit. Over the past week He has been using Medihoney and Hydrofera Blue to the wound bed. He has been using his surgical shoe with peg assist. He has no issues or complaints today. We  discussed doing the total contact cast and he was agreeable to having this placed at next clinic visit. 2/2; patient presents for follow-up. He has been using Vashe wet-to-dry dressings. Plan is for the total contact cast today. 2/5; patient presents for follow-up. The cast was placed 3 days ago. He tolerated this well however had a lot of drainage. He currently denies signs of infection. 2/9; patient presents for follow-up. At last clinic visit we held off on replacing the cast due to drainage. He has been using Vashe wet-to-dry dressings and he is currently taking the oral antibiotics prescribed without issues. He declines a total  contact cast today. We ran insurance verification for skin substitute and due to cost patient declines having this placed. Electronic Signature(s) Signed: 02/07/2023 12:26:02 PM By: Kalman Shan DO Entered By: Kalman Shan on 02/07/2023 09:29:08 -------------------------------------------------------------------------------- Physical Exam Details Patient Name: Date of Service: [name] [place], [name] 02/07/2023 8:45 A M Medical Record Number: [id number] Patient Account Number: [id number] Date of Birth/Sex: Treating RN: 1971-10-14 (52 y.o. M) Primary Care Provider: Asencion Noble Other Clinician: Referring Provider: Treating Provider/Extender: Madelynn Done in Treatment: 2 Constitutional respirations regular, non-labored and within target range for patient.. Cardiovascular 2+ dorsalis pedis/posterior tibialis pulses. Psychiatric pleasant and cooperative. Notes Left foot: T the lateral aspect, fifth met head there is an open wound with granulation tissue. No signs of infection including increased warmth, erythema or o purulent drainage. Electronic Signature(s) Signed: 02/07/2023 12:26:02 PM By: Kalman Shan DO Entered By: Kalman Shan on 02/07/2023 09:29:38 [name] ([id number].pdf Page 3 of 9 -------------------------------------------------------------------------------- Physician Orders Details Patient Name: Date of Service: [name] [place], [place] RO LD 02/07/2023 8:45 A M Medical Record Number: [id number] Patient Account Number: [id number] Date of Birth/Sex: Treating RN: 05-Sep-1971 (52 y.o. [name] Primary Care Provider: Asencion Noble Other Clinician: Referring Provider: Treating Provider/Extender: Madelynn Done in Treatment: 2 Verbal / Phone Orders: No Diagnosis Coding ICD-10 Coding Code Description [phone] Non-pressure chronic ulcer of other part of left foot with  fat layer exposed E11.621 Type 2 diabetes mellitus with foot ulcer Z89.422 Acquired absence of other left toe(s) Follow-up Appointments ppointment in 1 week. - Dr. Heber Graham 0900 02/14/2023 Return A *****CANCEL MONDAY 02/10/2023 APPT .***** Other: - Finish oral antibiotics. Call podiatry to follow with them. Anesthetic (In clinic) Topical Lidocaine 5% applied to wound bed Cellular or Tissue Based Products Cellular or Tissue Based Product Type: - apligraf and grafix 50% coinsurance Bathing/ Shower/ Hygiene May shower with protection but do not get wound dressing(s) wet. Protect dressing(s) with water repellant cover (for example, large plastic bag) or a cast cover and may then take shower. Off-Loading Other: - keep pressure off left lateral foot Wound Treatment Wound #3 - Foot Wound Laterality: Plantar, Left Cleanser: Wound Cleanser 1 x Per Day/15 Days Discharge Instructions: Cleanse the wound with wound cleanser prior to applying a clean dressing using [name] sponges, not tissue or cotton balls. Peri-Wound Care: Zinc Oxide Ointment 30g tube 1 x Per Day/15 Days Discharge Instructions: Apply Zinc Oxide to periwound with each dressing change Prim Dressing: Vashe 1 x Per Day/15 Days ary Discharge Instructions: apply directly to wound bed. Secondary Dressing: ABD Pad, 5x9 1 x Per Day/15 Days Discharge Instructions: Apply over primary dressing as directed. Secured With: [employer], 4.5x3.1 (in/yd) 1 x Per Day/15 Days Discharge Instructions: Secure with Kerlix as directed. Secured With: 45M Medipore H Soft Cloth Surgical T ape, 4 x 10 (  in/yd) 1 x Per Day/15 Days Discharge Instructions: Secure with tape as directed. Electronic Signature(s) Signed: 02/07/2023 12:26:02 PM By: Kalman Shan DO Entered By: Kalman Shan on 02/07/2023 09:29:45 [name] ([id number].pdf Page 4 of  9 -------------------------------------------------------------------------------- Problem List Details Patient Name: Date of Service: [name] [place], [place] RO LD 02/07/2023 8:45 A M Medical Record Number: [id number] Patient Account Number: [id number] Date of Birth/Sex: Treating RN: Feb 08, 1971 (52 y.o. [name] Primary Care Provider: Asencion Noble Other Clinician: Referring Provider: Treating Provider/Extender: Madelynn Done in Treatment: 2 Active Problems ICD-10 Encounter Code Description Active Date MDM Diagnosis [phone] Non-pressure chronic ulcer of other part of left foot with fat layer exposed 01/24/2023 No Yes E11.621 Type 2 diabetes mellitus with foot ulcer 01/24/2023 No Yes Z89.422 Acquired absence of other left toe(s) 01/24/2023 No Yes Inactive Problems Resolved Problems Electronic Signature(s) Signed: 02/07/2023 12:26:02 PM By: Kalman Shan DO Entered By: Kalman Shan on 02/07/2023 09:22:53 -------------------------------------------------------------------------------- Progress Note Details Patient Name: Date of Service: [name] [place], [name] 02/07/2023 8:45 A M Medical Record Number: [id number] Patient Account Number: [id number] Date of Birth/Sex: Treating RN: [birth year] (52 y.o. M) Primary Care Provider: Asencion Noble Other Clinician: Referring Provider: Treating Provider/Extender: Madelynn Done in Treatment: 2 Subjective Chief Complaint Information obtained from Patient 01/15/2023; left foot wound History of Present Illness (HPI) ADMISSION 01/11/2020 This is a 52 year old man with uncontrolled type 2 diabetes with a recent hemoglobin A1c earlier this year of 13.4. He is on insulin and glipizide. He does not take his blood sugars at home. He does have a follow-up with primary care later this month I believe on January 27. He tells [place] that roughly a month ago he was walking with a shoe with a hole in his foot.  He took the shoe off and there was an open wound at roughly the left fourth met head. This has significant undermining and raised edges. He has not noticed any purulence he does not feel unwell. More recently he was taking skin off the bottom of his foot and has a superficial area on the left fifth met head. He has not been offloading this. The patient was in the ER on 12/20. They gave him Bactroban which she has been using on the wound and 10 days worth of doxycycline. No x-rays were done. He has not had vascular studies. He is also been using hydrogen peroxide. [name], [name] ([id number]) [id number] Page 5 of 9 Past medical history type 2 diabetes uncontrolled, chronic systolic heart failure, coronary artery disease with a history of congestive heart failure with stents. Hypertension hyperlipidemia and chronic renal insufficiency ABI in this clinic was 1.14 on the left. 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 He is uncertain whether he would be able to work if we put him on some form of restriction 1/19; he is generally doing quite well. Using silver alginate on the wounds. Things actually look better. He has a forefoot offloading boot which she seems to be compliant about. He has support at work to stay off his foot is much as possible which is gratifying. Culture I did last week showed a few Enterococcus faecalis. I am going to put him on Augmentin. I talked about ordering an x-ray in my note last week but that does not seem to have happened. We will review reorder the x-ray this week. 1/26; x-ray reordered last week was negative for osteomyelitis. We are  using silver alginate on the wound on the third and fifth met heads on the left. He is using a Darco forefoot off loader 2/2; the area on the fifth met head is closed. Third met head is still open with tunneling depth and thick callus. 2/9; the area on the fifth met head remains closed  however the third met head again has a small open area on presentation with tunneling in depth and surrounded by thick callus. This looks like a pressure issue. We have been using silver alginate 2/16; the area of the fifth metatarsal head remains closed however the area over the third metatarsal head again is a small open area but with some depth. I do not think this is changed much since last week. He is using Hydrofera Blue with forefoot off loader. He is not able to use a total contact cast on the left leg because he needs his left leg at work [employer]]. Fortunately the wound does not look infected. I changed him to endoform today 2/26; the area of the fifth metatarsal head remains closed. The area of the third metatarsal head has an even smaller opening this time. I used endoform on this this looks improved. He is offloading this is much as he can and a forefoot off loader on the left. He cannot have a total contact cast because of work responsibilities 3/5; the area on the fifth metatarsal head remains closed the area on the third metatarsal head is also closed on the left foot. 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 He has been following for podiatry for several months for this issue. He is currently using wet-to-dry saline dressings. He currently denies signs of infection. Progresse note from 1/17; Patient presents with a 533-monthistory of nonhealing ulcer to the left foot secondary to diabetes and inability to offload well the area. He has had multiple debridements in the past to his feet bilaterally. He has had resection  of the fourth left met head in the fifth toe Secondary to osteomyelitis. We discussed the importance of glycemic control for wound healing. Due to his blood glucose levels being elevated he is at high risk for infection and thus further amputation. He expressed understanding. He states [facility]. Unclear what happened. [facility] we discussed the importance of aggressive offloading for his wound healing. This will be the most difficult part of the treatment plan for the patient to do. We discussed a total contact cast however he has declined that at this time. 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 He is currently using a surgical shoe with a peg assist. It does not fit well so we will give him a new one today. I do not think this is enough offloading. If he is not able to offload this area he will likely end up with a BKA. He is well aware of this. For now I recommended Medihoney and Hydrofera Blue for dressing  changes. He will follow-up in our [place] office Since this is closer for him. 1/26; patient presents for follow-up. Last clinic visit I had seen him in [place] and we transferred him to [place] since this is a closer location for him. Progress note above from that visit. Over the past week He has been using Medihoney and Hydrofera Blue to the wound bed. He has been using his surgical shoe with peg assist. He has no issues or complaints today. We discussed doing the total contact cast and he was agreeable to having this placed at next clinic visit. 2/2; patient presents for follow-up. He has been using Vashe wet-to-dry dressings. Plan is for the total contact cast today. 2/5; patient presents for follow-up. The cast was placed 3 days ago. He tolerated this  well however had a lot of drainage. He currently denies signs of infection. 2/9; patient presents for follow-up. At last clinic visit we held off on replacing the cast due to drainage. He has been using Vashe wet-to-dry dressings and he is currently taking the oral antibiotics prescribed without issues. He declines a total contact cast today. We ran insurance verification for skin substitute and due to cost patient declines having this placed. Patient History Information obtained from Patient. Family History Heart Disease - Mother,Father, No family history of Cancer, Diabetes, Hereditary Spherocytosis, Hypertension, Kidney Disease, Lung Disease, Seizures, Stroke, Thyroid Problems, Tuberculosis. Social History Never smoker, Marital Status - Single, Alcohol Use - Rarely, Drug Use - No History, Caffeine Use - Rarely. 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 46 Denies history of Angina, Arrhythmia, Deep Vein Thrombosis, Hypotension, Peripheral Arterial Disease, Peripheral Venous Disease, Phlebitis, Vasculitis Gastrointestinal Denies history of Cirrhosis , Colitis, Crohnoos, Hepatitis A, Hepatitis B, Hepatitis C Endocrine Patient has history of Type II Diabetes Genitourinary Denies history of End Stage Renal Disease Immunological Denies history of Lupus Erythematosus, Raynaudoos, Scleroderma Integumentary (Skin) Denies history of History of  Burn Musculoskeletal [name], [name] ([id number]) [id number] Page 6 of 9 Denies history of Gout, Rheumatoid Arthritis, Osteoarthritis, Osteomyelitis Neurologic Patient has history of Neuropathy Denies history of Dementia, Quadriplegia, Paraplegia, Seizure Disorder Oncologic Denies history of Received Chemotherapy, Received Radiation Psychiatric Denies history of Anorexia/bulimia, Confinement Anxiety Hospitalization/Surgery History - Heart Cath in 2018. Medical A Surgical History Notes nd Cardiovascular Ischemic Cardiomyopathy Genitourinary Renal Insufficiency Objective Constitutional respirations regular, non-labored and within target range for patient.. Vitals Time Taken: 8:47 AM, Height: 78 in, Weight: 275 lbs, BMI: 31.8, Temperature: 97.7 F, Pulse: 91 bpm, Respiratory Rate: 18 breaths/min, Blood Pressure: 123/88 mmHg. Cardiovascular 2+ dorsalis pedis/posterior tibialis pulses. Psychiatric pleasant and cooperative. General Notes: Left foot: T the lateral aspect, fifth met head there is an open wound with granulation tissue. No signs of infection including increased warmth, o erythema or purulent drainage. Integumentary (Hair, Skin) Wound #3 status is Open. Original cause of wound was Gradually Appeared. The date acquired was: 10/01/2022. The wound has been in treatment 2 weeks. The wound is located on the [place]. The wound measures 1.7cm length x 1cm width x 0.2cm depth; 1.335cm^2 area and 0.267cm^3 volume. There is Fat Layer (Subcutaneous Tissue) exposed. There is no tunneling  or undermining noted. There is a medium amount of serosanguineous drainage noted. The wound margin is distinct with the outline attached to the wound base. There is large (67-100%) red granulation within the wound bed. There is no necrotic tissue within the wound bed. The periwound skin appearance did not exhibit: Callus, Crepitus, Excoriation, Induration, Rash,  Scarring, Dry/Scaly, Maceration, Atrophie Blanche, Cyanosis, Ecchymosis, Hemosiderin Staining, Mottled, Pallor, Rubor, Erythema. Assessment Active Problems ICD-10 Non-pressure chronic ulcer of other part of left foot with fat layer exposed Type 2 diabetes mellitus with foot ulcer Acquired absence of other left toe(s) Patient's wound has shown improvement in size and apperance since last clinic visit. I recommended finishing oral antibiotics and continuing with Vashe wet-to- dry dressings. Patient has been following with podiatry for this wound for several months. Since patient cannot do a skin substitute due to cost and declines the total contact cast there there is very little left to offer. I recommended following back up with podiatry. He will follow with [place] until he gets back in. Plan Follow-up Appointments: Return Appointment in 1 week. - Dr. Heber [place] 0900 02/14/2023 *****CANCEL MONDAY 02/10/2023 APPT.***** Other: - Finish oral antibiotics. Call podiatry to follow with them. Anesthetic: (In clinic) Topical Lidocaine 5% applied to wound bed Cellular or Tissue Based Products: Cellular or Tissue Based Product Type: - apligraf and grafix 50% coinsurance Bathing/ Shower/ Hygiene: May shower with protection but do not get wound dressing(s) wet. Protect dressing(s) with water repellant cover (for example, large plastic bag) or a cast cover and may then take shower. Off-LoadingALEXZAVIER, [name] ([id number]) [id number] Page 7 of 9 Other: - keep pressure off left lateral foot WOUND #3: - Foot Wound Laterality: Plantar, Left Cleanser: Wound Cleanser 1 x Per Day/15 Days Discharge Instructions: Cleanse the wound with wound cleanser prior to applying a clean dressing using [name] sponges, not tissue or cotton balls. Peri-Wound Care: Zinc Oxide Ointment 30g tube 1 x Per Day/15 Days Discharge Instructions: Apply Zinc Oxide to periwound with each dressing change Prim  Dressing: Vashe 1 x Per Day/15 Days ary Discharge Instructions: apply directly to wound bed. Secondary Dressing: ABD Pad, 5x9 1 x Per Day/15 Days Discharge Instructions: Apply over primary dressing as directed. Secured With: [employer], 4.5x3.1 (in/yd) 1 x Per Day/15 Days Discharge Instructions: Secure with Kerlix as directed. Secured With: 56M Medipore H Soft Cloth Surgical T ape, 4 x 10 (in/yd) 1 x Per Day/15 Days Discharge Instructions: Secure with tape as directed. 1. Vashe wet-to-dry dressings 2. Aggressive offloadingoosurgical shoe with peg assist 3. Follow-up in 1 week Electronic Signature(s) Signed: 02/07/2023 12:26:02 PM By: Kalman Shan DO Entered By: Kalman Shan on 02/07/2023 09:33:08 -------------------------------------------------------------------------------- HxROS Details Patient Name: Date of Service: [name] [place], [name] 02/07/2023 8:45 A M Medical Record Number: [id number] Patient Account Number: [id number] Date of Birth/Sex: Treating RN: 08/17/1971 (52 y.o. M) Primary Care Provider: Asencion Noble Other Clinician: Referring Provider: Treating Provider/Extender: Madelynn Done in Treatment: 2 Information Obtained From Patient Eyes Medical History: Negative for: Cataracts; Glaucoma; Optic Neuritis Ear/Nose/Mouth/Throat Medical History: Positive for: Chronic sinus problems/congestion Negative for: Middle ear problems Hematologic/Lymphatic Medical History: Negative for: Anemia; Hemophilia; Human Immunodeficiency Virus; Lymphedema; Sickle Cell Disease Respiratory Medical History: Negative for: Aspiration; Asthma; Chronic Obstructive Pulmonary Disease (COPD); Pneumothorax; Sleep Apnea; Tuberculosis Cardiovascular Medical History: Positive for: Congestive Heart Failure; Coronary Artery Disease; Hypertension; Myocardial Infarction - age 55 Negative for: Angina; Arrhythmia; Deep Vein Thrombosis; Hypotension; Peripheral  Arterial Disease; Peripheral Venous Disease;  Phlebitis; Vasculitis Past Medical History Notes: Ischemic Cardiomyopathy Gastrointestinal Medical History: Negative for: Cirrhosis ; Colitis; Crohns; Hepatitis A; Hepatitis B; Hepatitis [name], [name] ([id number]) [id number] Page 8 of 9 Endocrine Medical History: Positive for: Type II Diabetes Time with diabetes: ten years Treated with: Insulin Blood sugar tested every day: No Genitourinary Medical History: Negative for: End Stage Renal Disease Past Medical History Notes: Renal Insufficiency Immunological Medical History: Negative for: Lupus Erythematosus; Raynauds; Scleroderma Integumentary (Skin) Medical History: Negative for: History of Burn Musculoskeletal Medical History: Negative for: Gout; Rheumatoid Arthritis; Osteoarthritis; Osteomyelitis Neurologic Medical History: Positive for: Neuropathy Negative for: Dementia; Quadriplegia; Paraplegia; Seizure Disorder Oncologic Medical History: Negative for: Received Chemotherapy; Received Radiation Psychiatric Medical History: Negative for: Anorexia/bulimia; Confinement Anxiety HBO Extended History Items Ear/Nose/Mouth/Throat: Chronic sinus problems/congestion Immunizations Pneumococcal Vaccine: Received Pneumococcal Vaccination: No Implantable Devices None Hospitalization / Surgery History Type of Hospitalization/Surgery Heart Cath in 2018 Family and Social History Cancer: No; Diabetes: No; Heart Disease: Yes - Mother,Father; Hereditary Spherocytosis: No; Hypertension: No; Kidney Disease: No; Lung Disease: No; Seizures: No; Stroke: No; Thyroid Problems: No; Tuberculosis: No; Never smoker; Marital Status - Single; Alcohol Use: Rarely; Drug Use: No History; Caffeine Use: Rarely; Financial Concerns: No; Food, Clothing or Shelter Needs: No; Support System Lacking: No; Transportation Concerns: No Electronic Signature(s) Signed: 02/07/2023  12:26:02 PM By: Kalman Shan DO Entered By: Kalman Shan on 02/07/2023 09:29:15 [name] ([id number] Page 9 of 9 -------------------------------------------------------------------------------- SuperBill Details Patient Name: Date of Service: [name] [place], [place] RO LD 02/07/2023 Medical Record Number: [id number] Patient Account Number: [id number] Date of Birth/Sex: Treating RN: 04-23-1971 (52 y.o. M) Primary Care Provider: Asencion Noble Other Clinician: Referring Provider: Treating Provider/Extender: Madelynn Done in Treatment: 2 Diagnosis Coding ICD-10 Codes Code Description [phone] 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.621 Type 2 diabetes mellitus with foot ulcer Z89.422 Acquired absence of other left toe(s) Physician Procedures : CPT4 Code Description Modifier [id number] - WC PHYS LEVEL 3 - EST PT ICD-10 Diagnosis Description L97.522 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.621 Type 2 diabetes mellitus with foot ulcer Z89.422 Acquired  absence of other left toe(s) Quantity: 1 Electronic Signature(s) Signed: 02/07/2023 12:26:02 PM By: Kalman Shan DO Entered By: Kalman Shan on 02/07/2023 09:33:22

## 2023-02-10 ENCOUNTER — Encounter (HOSPITAL_BASED_OUTPATIENT_CLINIC_OR_DEPARTMENT_OTHER): Payer: 59 | Admitting: Internal Medicine

## 2023-02-10 ENCOUNTER — Other Ambulatory Visit: Payer: Self-pay

## 2023-02-12 NOTE — Progress Notes (Signed)
[name], [name] ([id number]) [id number] Page 1 of 9 Visit Report for 02/07/2023 Arrival Information Details Patient Name: Date of Service: [name] [name], [name] 02/07/2023 8:45 A M Medical Record Number: [id number] Patient Account Number: [id number] Date of Birth/Sex: Treating RN: [birth year] (52 y.o. M) Primary Care [name]: [name] Other Clinician: Referring [name]: Treating [name]/Extender: [name] [name] Treatment: 2 Visit Information History Since Last Visit Added or deleted any medications: No Patient Arrived: Ambulatory Any new allergies or adverse reactions: No Arrival Time: 08:44 Had a fall or experienced change [name] No Accompanied By: self activities of daily living that may affect Transfer Assistance: None risk of falls: Patient Identification Verified: Yes Signs or symptoms of abuse/neglect since last visito No Secondary Verification Process Completed: Yes Hospitalized since last visit: No Patient Requires Transmission-Based Precautions: No Implantable device outside of the clinic excluding No Patient Has Alerts: Yes cellular tissue based products placed [name] the center Patient Alerts: 08/2021 ABI:1.2 TBI 0.79 since last visit: Has Dressing [name] Place as Prescribed: Yes Pain Present Now: No Electronic Signature(s) Signed: 02/12/2023 9:51:04 AM By: Sandre Kitty Entered By: Sandre Kitty on 02/07/2023 08:47:51 -------------------------------------------------------------------------------- Clinic Level of Care Assessment Details Patient Name: Date of Service: [name] [place], [name] 02/07/2023 8:45 A M Medical Record Number: [id number] Patient Account Number: [id number] Date of Birth/Sex: Treating RN: November 15, 1971 (52 y.o. [name] Primary Care [name]: [name] Other Clinician: Referring [name]: Treating [name]/Extender: [name] [name] Treatment: 2 Clinic Level  of Care Assessment Items TOOL 4 Quantity Score X- 1 0 Use when only an EandM is performed on FOLLOW-UP visit ASSESSMENTS - Nursing Assessment / Reassessment X- 1 10 Reassessment of Co-morbidities (includes updates [name] patient status) X- 1 5 Reassessment of Adherence to Treatment Plan ASSESSMENTS - Wound and Skin A ssessment / Reassessment X - Simple Wound Assessment / Reassessment - one wound 1 5 []$  - 0 Complex Wound Assessment / Reassessment - multiple wounds X- 1 10 Dermatologic / Skin Assessment (not related to wound area) ASSESSMENTS - Focused Assessment []$  - 0 Circumferential Edema Measurements - multi extremities []$  - 0 Nutritional Assessment / Counseling / Intervention [name], [name] ([id number]) [id number] Page 2 of 9 []$  - 0 Lower Extremity Assessment (monofilament, tuning fork, pulses) []$  - 0 Peripheral Arterial Disease Assessment (using hand held doppler) ASSESSMENTS - Ostomy and/or Continence Assessment and Care []$  - 0 Incontinence Assessment and Management []$  - 0 Ostomy Care Assessment and Management (repouching, etc.) PROCESS - Coordination of Care X - Simple Patient / Family Education for ongoing care 1 15 []$  - 0 Complex (extensive) Patient / Family Education for ongoing care X- 1 10 Staff obtains Programmer, systems, Records, T Results / Process Orders est []$  - 0 Staff telephones HHA, Nursing Homes / Clarify orders / etc []$  - 0 Routine Transfer to another Facility (non-emergent condition) []$  - 0 Routine Hospital Admission (non-emergent condition) []$  - 0 New Admissions / Biomedical engineer / Ordering NPWT Apligraf, etc. , []$  - 0 Emergency Hospital Admission (emergent condition) X- 1 10 Simple Discharge Coordination []$  - 0 Complex (extensive) Discharge Coordination PROCESS - Special Needs []$  - 0 Pediatric / Minor Patient Management []$  - 0 Isolation Patient Management []$  - 0 Hearing / Language / Visual special needs []$  -  0 Assessment of Community assistance (transportation, D/C planning, etc.) []$  - 0 Additional assistance / Altered mentation []$  - 0 Support Surface(s) Assessment (bed, cushion, seat, etc.) INTERVENTIONS - Wound Cleansing /  Measurement X - Simple Wound Cleansing - one wound 1 5 []$  - 0 Complex Wound Cleansing - multiple wounds X- 1 5 Wound Imaging (photographs - any number of wounds) []$  - 0 Wound Tracing (instead of photographs) X- 1 5 Simple Wound Measurement - one wound []$  - 0 Complex Wound Measurement - multiple wounds INTERVENTIONS - Wound Dressings X - Small Wound Dressing one or multiple wounds 1 10 []$  - 0 Medium Wound Dressing one or multiple wounds []$  - 0 Large Wound Dressing one or multiple wounds []$  - 0 Application of Medications - topical []$  - 0 Application of Medications - injection INTERVENTIONS - Miscellaneous []$  - 0 External ear exam []$  - 0 Specimen Collection (cultures, biopsies, blood, body fluids, etc.) []$  - 0 Specimen(s) / Culture(s) sent or taken to Lab for analysis []$  - 0 Patient Transfer (multiple staff / Civil Service fast streamer / Similar devices) []$  - 0 Simple Staple / Suture removal (25 or less) []$  - 0 Complex Staple / Suture removal (26 or more) []$  - 0 Hypo / Hyperglycemic Management (close monitor of Blood Glucose) [name], [name] ([id number].pdf Page 3 of 9 []$  - 0 Ankle / Brachial Index (ABI) - do not check if billed separately X- 1 5 Vital Signs Has the patient been seen at the hospital within the last three years: Yes Total Score: 95 Level Of Care: New/Established - Level 3 Electronic Signature(s) Signed: 02/07/2023 5:22:46 PM By: Deon Pilling RN, BSN Entered By: Deon Pilling on 02/07/2023 09:14:42 -------------------------------------------------------------------------------- Encounter Discharge Information Details Patient Name: Date of Service: [name], [name] 02/07/2023 8:45 A M Medical Record Number:  [id number] Patient Account Number: [id number] Date of Birth/Sex: Treating RN: 1971/06/15 (52 y.o. [name] Primary Care Salvatore Poe: [name] Other Clinician: Referring Nichlos Kunzler: Treating Cherylanne Ardelean/Extender: [name] [name] Treatment: 2 Encounter Discharge Information Items Discharge Condition: Stable Ambulatory Status: Ambulatory Discharge Destination: Home Transportation: Private Auto Accompanied By: self Schedule Follow-up Appointment: Yes Clinical Summary of Care: Electronic Signature(s) Signed: 02/07/2023 5:22:46 PM By: Deon Pilling RN, BSN Entered By: Deon Pilling on 02/07/2023 09:15:11 -------------------------------------------------------------------------------- Lower Extremity Assessment Details Patient Name: Date of Service: [name], [name] 02/07/2023 8:45 A M Medical Record Number: [id number] Patient Account Number: [id number] Date of Birth/Sex: Treating RN: 11-07-1971 (52 y.o. [name] Primary Care Tessy Pawelski: [name] Other Clinician: Referring Marsel Gail: Treating Andjela Wickes/Extender: [name] [name] Treatment: 2 Edema Assessment Assessed: Shirlyn Goltz: Yes] Patrice Paradise: No] Edema: [Left: N] [Right: o] Calf Left: Right: Point of Measurement: 43 cm From Medial Instep 38.5 cm Ankle Left: Right: Point of Measurement: 9 cm From Medial Instep 22 cm Vascular Assessment [name], [name] ([id number]) [Right:[id number] Page 4 of 9] Pulses: Dorsalis Pedis Palpable: [Left:Yes] Electronic Signature(s) Signed: 02/07/2023 5:22:46 PM By: Deon Pilling RN, BSN Entered By: Deon Pilling on 02/07/2023 09:10:22 -------------------------------------------------------------------------------- Multi Wound Chart Details Patient Name: Date of Service: [name], [name] 02/07/2023 8:45 A M Medical Record Number: [id number] Patient Account Number: [id number] Date of Birth/Sex: Treating  RN: August 17, 1971 (52 y.o. M) Primary Care Lamiracle Chaidez: [name] Other Clinician: Referring Daimian Sudberry: Treating Bianca Vester/Extender: [name] [name] Treatment: 2 Vital Signs Height([name]): 78 Pulse(bpm): 21 Weight(lbs): 275 Blood Pressure(mmHg): 123/88 Body Mass Index(BMI): 31.8 Temperature(F): 97.7 Respiratory Rate(breaths/min): 18 [3:Photos:] [N/A:N/A] Left, Plantar Foot N/A N/A Wound Location: Gradually Appeared N/A N/A Wounding Event: Diabetic Wound/Ulcer of the Lower N/A N/A Primary Etiology: Extremity Chronic sinus problems/congestion, N/A N/A Comorbid History:  Congestive Heart Failure, Coronary Artery Disease, Hypertension, Myocardial Infarction, Type II Diabetes, Neuropathy 10/01/2022 N/A N/A Date Acquired: 2 N/A N/A Weeks of Treatment: Open N/A N/A Wound Status: No N/A N/A Wound Recurrence: Yes N/A N/A Pending [name] on Presentation: 1.7x1x0.2 N/A N/A Measurements L x W x D (cm) 1.335 N/A N/A A (cm) : rea 0.267 N/A N/A Volume (cm) : 66.00% N/A N/A % Reduction [name] A rea: 66.00% N/A N/A % Reduction [name] Volume: Grade 1 N/A N/A Classification: Medium N/A N/A Exudate A mount: Serosanguineous N/A N/A Exudate Type: red, brown N/A N/A Exudate Color: Distinct, outline attached N/A N/A Wound Margin: Large (67-100%) N/A N/A Granulation A mount: Red N/A N/A Granulation Quality: None Present (0%) N/A N/A Necrotic A mount: Fat Layer (Subcutaneous Tissue): Yes N/A N/A Exposed Structures: Fascia: No Tendon: No Muscle: No Joint: No Bone: No Small (1-33%) N/A N/A EpithelializationEDVARDO, [name] ([id number] Page 5 of 9 Excoriation: No N/A N/A Periwound Skin Texture: Induration: No Callus: No Crepitus: No Rash: No Scarring: No Maceration: No N/A N/A Periwound Skin Moisture: Dry/Scaly: No Atrophie Blanche: No N/A N/A Periwound Skin Color: Cyanosis: No Ecchymosis:  No Erythema: No Hemosiderin Staining: No Mottled: No Pallor: No Rubor: No Treatment Notes Wound #3 (Foot) Wound Laterality: Plantar, Left Cleanser Wound Cleanser Discharge Instruction: Cleanse the wound with wound cleanser prior to applying a clean dressing using [name] sponges, not tissue or cotton balls. Peri-Wound Care Zinc Oxide Ointment 30g tube Discharge Instruction: Apply Zinc Oxide to periwound with each dressing change Topical Primary Dressing Vashe Discharge Instruction: apply directly to wound bed. Secondary Dressing ABD Pad, 5x9 Discharge Instruction: Apply over primary dressing as directed. Secured With [employer], 4.5x3.1 ([name]/yd) Discharge Instruction: Secure with Kerlix as directed. 22M Medipore H Soft Cloth Surgical T ape, 4 x 10 ([name]/yd) Discharge Instruction: Secure with tape as directed. Compression Wrap Compression Stockings Add-Ons Electronic Signature(s) Signed: 02/07/2023 12:26:02 PM By: Kalman Shan DO Entered By: Kalman Shan on 02/07/2023 09:22:58 -------------------------------------------------------------------------------- Multi-Disciplinary Care Plan Details Patient Name: Date of Service: [name], [name] 02/07/2023 8:45 A M Medical Record Number: [id number] Patient Account Number: [id number] Date of Birth/Sex: Treating RN: Jul 19, 1971 (52 y.o. [name] Primary Care Sherylann Vangorden: [name] Other Clinician: Referring Alysa Duca: Treating Malachi Kinzler/Extender: [name] [name] Treatment: [address] [place], Joneen Boers ([id number]) [id number] Page 6 of 9 Wound/Skin Impairment Nursing Diagnoses: Impaired tissue integrity Knowledge deficit related to ulceration/compromised skin integrity Goals: Patient will have a decrease [name] wound volume by X% from date: (specify [name] notes) Date Initiated: 01/24/2023 Target Resolution Date: 02/28/2023 Goal Status:  Active Patient/caregiver will verbalize understanding of skin care regimen Date Initiated: 01/24/2023 Target Resolution Date: 02/28/2023 Goal Status: Active Ulcer/skin breakdown will have a volume reduction of 30% by week 4 Date Initiated: 01/24/2023 Target Resolution Date: 02/20/2023 Goal Status: Active Ulcer/skin breakdown will have a volume reduction of 50% by week 8 Date Initiated: 01/24/2023 Target Resolution Date: 03/28/2023 Goal Status: Active Interventions: Assess patient/caregiver ability to obtain necessary supplies Assess patient/caregiver ability to perform ulcer/skin care regimen upon admission and as needed Assess ulceration(s) every visit Notes: Electronic Signature(s) Signed: 02/07/2023 5:22:46 PM By: Deon Pilling RN, BSN Entered By: Deon Pilling on 02/07/2023 08:58:00 -------------------------------------------------------------------------------- Pain Assessment Details Patient Name: Date of Service: [name], [name] 02/07/2023 8:45 A M Medical Record Number: [id number] Patient Account Number: [id number] Date of Birth/Sex: Treating RN: 15-Dec-1971 (52 y.o. M) Primary Care Berdella Bacot: [name] Other Clinician:  Referring Ethylene Reznick: Treating Tila Millirons/Extender: [name] [name] Treatment: 2 Active Problems Location of Pain Severity and Description of Pain Patient Has Paino No Site Locations Pain Management and Medication Current Pain Management: [name], [name] ([id number]) [id number].pdf Page 7 of 9 Electronic Signature(s) Signed: 02/12/2023 9:51:04 AM By: Sandre Kitty Entered By: Sandre Kitty on 02/07/2023 08:48:17 -------------------------------------------------------------------------------- Patient/Caregiver Education Details Patient Name: Date of Service: [name], [name] 2/9/2024andnbsp8:45 A M Medical Record Number: [id number] Patient Account Number: [id number] Date of Birth/Gender: Treating  RN: [birth year] (52 y.o. [name] Primary Care Physician: [name] Other Clinician: Referring Physician: Treating Physician/Extender: [name] [name] Treatment: 2 Education Assessment Education Provided To: Patient Education Topics Provided Wound/Skin Impairment: Handouts: Caring for Your Ulcer Methods: Explain/Verbal Responses: Reinforcements needed Electronic Signature(s) Signed: 02/07/2023 5:22:46 PM By: Deon Pilling RN, BSN Entered By: Deon Pilling on 02/07/2023 08:58:57 -------------------------------------------------------------------------------- Wound Assessment Details Patient Name: Date of Service: [name], [name] 02/07/2023 8:45 A M Medical Record Number: [id number] Patient Account Number: [id number] Date of Birth/Sex: Treating RN: 02/14/1971 (53 y.o. [name] Primary Care Najat Olazabal: [name] Other Clinician: Referring Journee Bobrowski: Treating Deantre Bourdon/Extender: [name] [name] Treatment: 2 Wound Status Wound Number: 3 Primary Diabetic Wound/Ulcer of the Lower Extremity Etiology: Wound Location: Left, Plantar Foot Wound Open Wounding Event: Gradually Appeared Status: Date Acquired: 10/01/2022 Comorbid Chronic sinus problems/congestion, Congestive Heart Failure, Weeks Of Treatment: 2 History: Coronary Artery Disease, Hypertension, Myocardial Infarction, Type Clustered Wound: No II Diabetes, Neuropathy Pending Amputation On Presentation Wound under treatment by Agam [facility]enport outside of [place], [place] ([id number]) [id number] Page 8 of 9 Wound Measurements Length: (cm) 1.7 Width: (cm) 1 Depth: (cm) 0.2 Area: (cm) 1.335 Volume: (cm) 0.267 % Reduction [name] Area: 66% % Reduction [name] Volume: 66% Epithelialization: Small (1-33%) Tunneling: No Undermining: No Wound Description Classification: Grade 1 Wound Margin: Distinct, outline  attached Exudate Amount: Medium Exudate Type: Serosanguineous Exudate Color: red, brown Foul Odor After Cleansing: No Slough/Fibrino No Wound Bed Granulation Amount: Large (67-100%) Exposed Structure Granulation Quality: Red Fascia Exposed: No Necrotic Amount: None Present (0%) Fat Layer (Subcutaneous Tissue) Exposed: Yes Tendon Exposed: No Muscle Exposed: No Joint Exposed: No Bone Exposed: No Periwound Skin Texture Texture Color No Abnormalities Noted: No No Abnormalities Noted: No Callus: No Atrophie Blanche: No Crepitus: No Cyanosis: No Excoriation: No Ecchymosis: No Induration: No Erythema: No Rash: No Hemosiderin Staining: No Scarring: No Mottled: No Pallor: No Moisture Rubor: No No Abnormalities Noted: No Dry / Scaly: No Maceration: No Treatment Notes Wound #3 (Foot) Wound Laterality: Plantar, Left Cleanser Wound Cleanser Discharge Instruction: Cleanse the wound with wound cleanser prior to applying a clean dressing using [name] sponges, not tissue or cotton balls. Peri-Wound Care Zinc Oxide Ointment 30g tube Discharge Instruction: Apply Zinc Oxide to periwound with each dressing change Topical Primary Dressing Vashe Discharge Instruction: apply directly to wound bed. Secondary Dressing ABD Pad, 5x9 Discharge Instruction: Apply over primary dressing as directed. Secured With [employer], 4.5x3.1 ([name]/yd) [place], Joneen Boers ([id number]) [id number] Page 9 of 9 Discharge Instruction: Secure with Kerlix as directed. 30M Medipore H Soft Cloth Surgical T ape, 4 x 10 ([name]/yd) Discharge Instruction: Secure with tape as directed. Compression Wrap Compression Stockings Add-Ons Electronic Signature(s) Signed: 02/07/2023 5:22:46 PM By: Deon Pilling RN, BSN Entered By: Deon Pilling on 02/07/2023 09:14:51 -------------------------------------------------------------------------------- Vitals Details Patient Name: Date of  Service: [name], [name] 02/07/2023 8:45 A M Medical  Record Number: [id number] Patient Account Number: [id number] Date of Birth/Sex: Treating RN: 31-Mar-1971 (52 y.o. M) Primary Care Fiana Gladu: [name] Other Clinician: Referring Claira Jeter: Treating Guy Toney/Extender: [name] [name] Treatment: 2 Vital Signs Time Taken: 08:47 Temperature (F): 97.7 Height ([name]): 78 Pulse (bpm): 91 Weight (lbs): 275 Respiratory Rate (breaths/min): 18 Body Mass Index (BMI): 31.8 Blood Pressure (mmHg): 123/88 Reference Range: 80 - 120 mg / dl Electronic Signature(s) Signed: 02/12/2023 9:51:04 AM By: Sandre Kitty Entered By: Sandre Kitty on 02/07/2023 08:48:11

## 2023-02-14 ENCOUNTER — Encounter (HOSPITAL_BASED_OUTPATIENT_CLINIC_OR_DEPARTMENT_OTHER): Payer: 59 | Admitting: Internal Medicine

## 2023-02-14 DIAGNOSIS — Z7984 Long term (current) use of oral hypoglycemic drugs: Secondary | ICD-10-CM | POA: Diagnosis not present

## 2023-02-14 DIAGNOSIS — E1140 Type 2 diabetes mellitus with diabetic neuropathy, unspecified: Secondary | ICD-10-CM | POA: Diagnosis not present

## 2023-02-14 DIAGNOSIS — I2510 Atherosclerotic heart disease of native coronary artery without angina pectoris: Secondary | ICD-10-CM | POA: Diagnosis not present

## 2023-02-14 DIAGNOSIS — Z89422 Acquired absence of other left toe(s): Secondary | ICD-10-CM | POA: Diagnosis not present

## 2023-02-14 DIAGNOSIS — L97522 Non-pressure chronic ulcer of other part of left foot with fat layer exposed: Secondary | ICD-10-CM

## 2023-02-14 DIAGNOSIS — E1122 Type 2 diabetes mellitus with diabetic chronic kidney disease: Secondary | ICD-10-CM | POA: Diagnosis not present

## 2023-02-14 DIAGNOSIS — N189 Chronic kidney disease, unspecified: Secondary | ICD-10-CM | POA: Diagnosis not present

## 2023-02-14 DIAGNOSIS — I5042 Chronic combined systolic (congestive) and diastolic (congestive) heart failure: Secondary | ICD-10-CM | POA: Diagnosis not present

## 2023-02-14 DIAGNOSIS — E11621 Type 2 diabetes mellitus with foot ulcer: Secondary | ICD-10-CM | POA: Diagnosis not present

## 2023-02-14 DIAGNOSIS — E785 Hyperlipidemia, unspecified: Secondary | ICD-10-CM | POA: Diagnosis not present

## 2023-02-14 DIAGNOSIS — Z794 Long term (current) use of insulin: Secondary | ICD-10-CM | POA: Diagnosis not present

## 2023-02-14 DIAGNOSIS — I252 Old myocardial infarction: Secondary | ICD-10-CM | POA: Diagnosis not present

## 2023-02-14 DIAGNOSIS — I130 Hypertensive heart and chronic kidney disease with heart failure and stage 1 through stage 4 chronic kidney disease, or unspecified chronic kidney disease: Secondary | ICD-10-CM | POA: Diagnosis not present

## 2023-02-15 DIAGNOSIS — G4733 Obstructive sleep apnea (adult) (pediatric): Secondary | ICD-10-CM | POA: Diagnosis not present

## 2023-02-15 NOTE — Progress Notes (Signed)
DAKARAI, JEPPSEN ([id number]) [id number] Page 1 of 9 Visit Report for 02/14/2023 Arrival Information Details Patient Name: Date of Service: [name] [place], [place] RO LD 02/14/2023 8:45 A M Medical Record Number: [id number] Patient Account Number: [id number] Date of Birth/Sex: Treating RN: April 21, 1971 (52 y.o. [name], Lauren Primary Care Jaziah Kwasnik: Asencion Noble Other Clinician: Referring Allecia Bells: Treating Aimee Heldman/Extender: Madelynn Done in Treatment: 3 Visit Information History Since Last Visit Added or deleted any medications: No Patient Arrived: Ambulatory Any new allergies or adverse reactions: No Arrival Time: 08:06 Had a fall or experienced change in No Accompanied By: self activities of daily living that may affect Transfer Assistance: None risk of falls: Patient Identification Verified: Yes Signs or symptoms of abuse/neglect since last visito No Secondary Verification Process Completed: Yes Hospitalized since last visit: No Patient Requires Transmission-Based Precautions: No Implantable device outside of the clinic excluding No Patient Has Alerts: Yes cellular tissue based products placed in the center Patient Alerts: 08/2021 ABI:1.2 TBI 0.79 since last visit: Has Dressing in Place as Prescribed: Yes Pain Present Now: No Electronic Signature(s) Signed: 02/14/2023 12:08:46 PM By: Rhae Hammock RN Entered By: Rhae Hammock on 02/14/2023 08:07:18 -------------------------------------------------------------------------------- Clinic Level of Care Assessment Details Patient Name: Date of Service: [name] [place], [place] RO LD 02/14/2023 8:45 A M Medical Record Number: [id number] Patient Account Number: [id number] Date of Birth/Sex: Treating RN: [birth year] (52 y.o. [name], Lauren Primary Care Wael Maestas: Asencion Noble Other Clinician: Referring Eletha Culbertson: Treating Jalen Daluz/Extender: Madelynn Done  in Treatment: 3 Clinic Level of Care Assessment Items TOOL 4 Quantity Score X- 1 0 Use when only an EandM is performed on FOLLOW-UP visit ASSESSMENTS - Nursing Assessment / Reassessment X- 1 10 Reassessment of Co-morbidities (includes updates in patient status) X- 1 5 Reassessment of Adherence to Treatment Plan ASSESSMENTS - Wound and Skin A ssessment / Reassessment X - Simple Wound Assessment / Reassessment - one wound 1 5 []$  - 0 Complex Wound Assessment / Reassessment - multiple wounds []$  - 0 Dermatologic / Skin Assessment (not related to wound area) ASSESSMENTS - Focused Assessment X- 1 5 Circumferential Edema Measurements - multi extremities []$  - 0 Nutritional Assessment / Counseling / Intervention CHURCHILL, GERE ([id number]) [id number] Page 2 of 9 []$  - 0 Lower Extremity Assessment (monofilament, tuning fork, pulses) []$  - 0 Peripheral Arterial Disease Assessment (using hand held doppler) ASSESSMENTS - Ostomy and/or Continence Assessment and Care []$  - 0 Incontinence Assessment and Management []$  - 0 Ostomy Care Assessment and Management (repouching, etc.) PROCESS - Coordination of Care X - Simple Patient / Family Education for ongoing care 1 15 []$  - 0 Complex (extensive) Patient / Family Education for ongoing care X- 1 10 Staff obtains Programmer, systems, Records, T Results / Process Orders est []$  - 0 Staff telephones HHA, Nursing Homes / Clarify orders / etc []$  - 0 Routine Transfer to another Facility (non-emergent condition) []$  - 0 Routine Hospital Admission (non-emergent condition) []$  - 0 New Admissions / Biomedical engineer / Ordering NPWT Apligraf, etc. , []$  - 0 Emergency Hospital Admission (emergent condition) X- 1 10 Simple Discharge Coordination []$  - 0 Complex (extensive) Discharge Coordination PROCESS - Special Needs []$  - 0 Pediatric / Minor Patient Management []$  - 0 Isolation Patient Management []$  - 0 Hearing / Language /  Visual special needs []$  - 0 Assessment of Community assistance (transportation, D/C planning, etc.) []$  - 0 Additional assistance / Altered mentation []$  - 0 Support Surface(s) Assessment (bed, cushion, seat, etc.) INTERVENTIONS -  Wound Cleansing / Measurement X - Simple Wound Cleansing - one wound 1 5 []$  - 0 Complex Wound Cleansing - multiple wounds X- 1 5 Wound Imaging (photographs - any number of wounds) []$  - 0 Wound Tracing (instead of photographs) X- 1 5 Simple Wound Measurement - one wound []$  - 0 Complex Wound Measurement - multiple wounds INTERVENTIONS - Wound Dressings []$  - 0 Small Wound Dressing one or multiple wounds X- 1 15 Medium Wound Dressing one or multiple wounds []$  - 0 Large Wound Dressing one or multiple wounds []$  - 0 Application of Medications - topical []$  - 0 Application of Medications - injection INTERVENTIONS - Miscellaneous []$  - 0 External ear exam []$  - 0 Specimen Collection (cultures, biopsies, blood, body fluids, etc.) []$  - 0 Specimen(s) / Culture(s) sent or taken to Lab for analysis []$  - 0 Patient Transfer (multiple staff / Civil Service fast streamer / Similar devices) []$  - 0 Simple Staple / Suture removal (25 or less) []$  - 0 Complex Staple / Suture removal (26 or more) []$  - 0 Hypo / Hyperglycemic Management (close monitor of Blood Glucose) MORDECAI, OO ([id number]) [id number] Page 3 of 9 []$  - 0 Ankle / Brachial Index (ABI) - do not check if billed separately X- 1 5 Vital Signs Has the patient been seen at the hospital within the last three years: Yes Total Score: 95 Level Of Care: New/Established - Level 3 Electronic Signature(s) Signed: 02/14/2023 12:08:46 PM By: Rhae Hammock RN Entered By: Rhae Hammock on 02/14/2023 08:58:58 -------------------------------------------------------------------------------- Encounter Discharge Information Details Patient Name: Date of Service: [name], [name] 02/14/2023 8:45  A M Medical Record Number: [id number] Patient Account Number: [id number] Date of Birth/Sex: Treating RN: 09/11/1971 (52 y.o. [name], Lauren Primary Care Maleki Hippe: Asencion Noble Other Clinician: Referring Haizley Cannella: Treating Heena Woodbury/Extender: Madelynn Done in Treatment: 3 Encounter Discharge Information Items Discharge Condition: Stable Ambulatory Status: Ambulatory Discharge Destination: Home Transportation: Private Auto Accompanied By: self Schedule Follow-up Appointment: Yes Clinical Summary of Care: Patient Declined Electronic Signature(s) Signed: 02/14/2023 12:08:46 PM By: Rhae Hammock RN Entered By: Rhae Hammock on 02/14/2023 09:14:24 -------------------------------------------------------------------------------- Lower Extremity Assessment Details Patient Name: Date of Service: [name] [place], [name] 02/14/2023 8:45 A M Medical Record Number: [id number] Patient Account Number: [id number] Date of Birth/Sex: Treating RN: [birth year] (52 y.o. [name], Lauren Primary Care Briell Paulette: Asencion Noble Other Clinician: Referring Ahmir Bracken: Treating Keasia Dubose/Extender: Madelynn Done in Treatment: 3 Edema Assessment Assessed: Shirlyn Goltz: Yes] Patrice Paradise: No] Edema: [Left: N] [Right: o] Calf Left: Right: Point of Measurement: 43 cm From Medial Instep 38.5 cm Ankle Left: Right: Point of Measurement: 9 cm From Medial Instep 22 cm Vascular Assessment SANJEEV, RENNA ([id number]) [id number] Page 4 of 9] Pulses: Dorsalis Pedis Palpable: [Left:Yes] Posterior Tibial Palpable: [Left:Yes] Electronic Signature(s) Signed: 02/14/2023 12:08:46 PM By: Rhae Hammock RN Entered By: Rhae Hammock on 02/14/2023 08:08:30 -------------------------------------------------------------------------------- Multi Wound Chart Details Patient Name: Date of Service: [name] [place], [name] 02/14/2023 8:45 A  M Medical Record Number: [id number] Patient Account Number: [id number] Date of Birth/Sex: Treating RN: 21-May-1971 (52 y.o. 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 Etiology: Extremity Chronic sinus problems/congestion, Comorbid History: Congestive Heart  Failure, Coronary Artery Disease,  Hypertension, Myocardial Infarction, Type II Diabetes, Neuropathy 10/01/2022 Date Acquired: 3 Weeks of Treatment: Open Wound Status: No Wound Recurrence: Yes Pending [name] on Presentation: 1.7x1.2x0.2 Measurements L x W x D (cm) 1.602 A (cm) : rea  0.32 Volume (cm) : 59.20% % Reduction in A rea: 59.20% % Reduction in Volume: Grade 1 Classification: Medium Exudate A mount: Serosanguineous Exudate Type: red, brown Exudate Color: Distinct, outline attached Wound Margin: Large (67-100%) Granulation A  mount: Red Granulation Quality: None Present (0%) Necrotic A mount: Fat Layer (Subcutaneous Tissue): Yes N/A Exposed Structures: Fascia: No Tendon: No Muscle: No Joint: No Bone: No Small (1-33%) Epithelialization: Excoriation: No Periwound Skin Texture:  Induration: No Callus: No Crepitus: No Rash: No Scarring: No Maceration: No Periwound Skin Moisture: Dry/Scaly: No] [N/A:N/A N/A N/A N/A N/A N/A N/A N/A N/A N/A N/A N/A N/A N/A N/A N/A N/A N/A N/A N/A N/A N/A N/A N/A N/A N/A] WILSHIRE, Coulton ([id number]) [3:Atrophie Blanche: No Periwound Skin Color: Cyanosis: No Ecchymosis: No Erythema: No Hemosiderin Staining: No Mottled: No Pallor: No Rubor: No No Abnormality Temperature: Yes Tenderness on  Palpation:] [N/A:N/A N/A N/A] Treatment Notes Wound #3 (Foot) Wound Laterality: Plantar, Left Cleanser Wound Cleanser Discharge Instruction: Cleanse the wound with wound cleanser prior to applying a clean dressing using [name] sponges, not tissue or cotton balls. Peri-Wound Care Zinc Oxide Ointment 30g tube Discharge Instruction: Apply Zinc Oxide to periwound with each dressing change Topical Primary Dressing Vashe Discharge Instruction: apply directly to wound bed. Secondary Dressing ABD Pad, 5x9 Discharge Instruction: Apply over primary dressing as directed. Secured With [employer], 4.5x3.1 (in/yd) Discharge Instruction: Secure with Kerlix as directed. 92M Medipore H Soft Cloth Surgical T ape, 4 x 10 (in/yd) Discharge Instruction: Secure with tape as directed. Compression Wrap Compression Stockings Add-Ons Electronic Signature(s) Signed: 02/14/2023 10:13:15 AM By: Kalman Shan DO Entered By: Kalman Shan on 02/14/2023 09:30:01 -------------------------------------------------------------------------------- Multi-Disciplinary Care Plan Details Patient Name: Date of Service: [name] [place], [name] 02/14/2023 8:45 A M Medical Record Number: [id number] Patient Account Number: [id number] Date of Birth/Sex: Treating RN: [birth year] (52 y.o. [name], Lauren Primary Care Taydem Cavagnaro: Asencion Noble Other Clinician: Referring Loretta Kluender: Treating Cher Franzoni/Extender: Madelynn Done in Treatment: 3 Active Inactive Wound/Skin Impairment Nursing Diagnoses: Impaired tissue integrity Knowledge deficit related to ulceration/compromised skin integrity ALGIRD, RIETZ ([id number]) [id number] Page 6 of 9 Goals: Patient will have a decrease in wound volume by X% from date: (specify in notes) Date Initiated: 01/24/2023 Target Resolution Date: 02/28/2023 Goal Status: Active Patient/caregiver will verbalize understanding of skin  care regimen Date Initiated: 01/24/2023 Target Resolution Date: 02/28/2023 Goal Status: Active Ulcer/skin breakdown will have a volume reduction of 30% by week 4 Date Initiated: 01/24/2023 Target Resolution Date: 02/20/2023 Goal Status: Active Ulcer/skin breakdown will have a volume reduction of 50% by week 8 Date Initiated: 01/24/2023 Target Resolution Date: 03/28/2023 Goal Status: Active Interventions: Assess patient/caregiver ability to obtain necessary supplies Assess patient/caregiver ability to perform ulcer/skin care regimen upon admission and as needed Assess ulceration(s) every visit Notes: Electronic Signature(s) Signed: 02/14/2023 12:08:46 PM By: Rhae Hammock RN Entered By: Rhae Hammock on 02/14/2023 08:15:35 -------------------------------------------------------------------------------- Pain Assessment Details Patient Name: Date of Service: [name] [place], [name] 02/14/2023 8:45 A M Medical Record Number: [id number] Patient Account Number: [id number] Date of Birth/Sex: Treating RN: 10-27-1971 (52 y.o. [name], Lauren Primary Care Keithen Capo: Asencion Noble Other Clinician: Referring Anaelle Dunton: Treating Sumiko Ceasar/Extender: Madelynn Done in Treatment:  3 Active Problems Location of Pain Severity and Description of Pain Patient Has Paino No Site Locations Pain Management and Medication Current Pain Management: Electronic Signature(s) Signed: 02/14/2023 12:08:46 PM By: Rhae Hammock RN Entered By: Rhae Hammock on 02/14/2023 08:07:41 [name] ([id number]) [id number] Page 7 of 9 -------------------------------------------------------------------------------- Patient/Caregiver Education Details Patient Name: Date of Service: [name] [place], [place] RO LD 2/16/2024andnbsp8:45 [place] Record Number: [id number] Patient Account Number: [id number] Date of Birth/Gender: Treating RN: 03-Nov-1971 (52 y.o. [name], Lauren Primary Care Physician: Asencion Noble Other Clinician: Referring Physician: Treating Physician/Extender: Madelynn Done in Treatment: 3 Education Assessment Education Provided To: Patient Education Topics Provided Wound/Skin Impairment: Methods: Explain/Verbal Responses: Reinforcements needed, State content correctly Electronic Signature(s) Signed: 02/14/2023 12:08:46 PM By: Rhae Hammock RN Entered By: Rhae Hammock on 02/14/2023 08:16:04 -------------------------------------------------------------------------------- Wound Assessment Details Patient Name: Date of Service: [name] [place], [name] 02/14/2023 8:45 A M Medical Record Number: [id number] Patient Account Number: [id number] Date of Birth/Sex: Treating RN: 12/27/1971 (52 y.o. [name], Lauren Primary Care Chrystina Naff: Asencion Noble Other Clinician: Referring Tahjae Durr: Treating Ulysses Alper/Extender: Madelynn Done in Treatment: 3 Wound Status Wound Number: 3 Primary Diabetic Wound/Ulcer of the Lower Extremity Etiology: Wound Location: Left, Plantar Foot Wound Open Wounding Event: Gradually Appeared Status: Date Acquired: 10/01/2022 Comorbid Chronic sinus problems/congestion, Congestive Heart Failure, Weeks Of Treatment: 3 History: Coronary Artery Disease, Hypertension, Myocardial Infarction, Type Clustered Wound: No II Diabetes, Neuropathy Pending Amputation On Presentation Wound under treatment by Pavle Wiler outside of [place] Wound Measurements Length: (cm) 1.7 Width: (cm) 1.2 Depth: (cm) 0.2 Area: (cm) 1.602 Volume: (cm) 0.32 % Reduction in Area: 59.2% % Reduction in Volume: 59.2% Epithelialization: Small (1-33%) Tunneling: No Undermining: No Wound Description Classification: Grade 1 Wound Margin: Distinct, outline attached Exudate Amount: Medium Exudate Type: Serosanguineous Exudate Color: red, brown STONEWALL, CATTANI  ([id number]) Wound Bed Granulation Amount: Large (67-100%) Granulation Quality: Red Necrotic Amount: None Present (0%) Foul Odor After Cleansing: No Slough/Fibrino No [id number] Page 8 of 9 Exposed Structure Fascia Exposed: No Fat Layer (Subcutaneous Tissue) Exposed: Yes Tendon Exposed: No Muscle Exposed: No Joint Exposed: No Bone Exposed: No Periwound Skin Texture Texture Color No Abnormalities Noted: No No Abnormalities Noted: No Callus: No Atrophie Blanche: No Crepitus: No Cyanosis: No Excoriation: No Ecchymosis: No Induration: No Erythema: No Rash: No Hemosiderin Staining: No Scarring: No Mottled: No Pallor: No Moisture Rubor: No No Abnormalities Noted: No Dry / Scaly: No Temperature / Pain Maceration: No Temperature: No Abnormality Tenderness on Palpation: Yes Treatment Notes Wound #3 (Foot) Wound Laterality: Plantar, Left Cleanser Wound Cleanser Discharge Instruction: Cleanse the wound with wound cleanser prior to applying a clean dressing using [name] sponges, not tissue or cotton balls. Peri-Wound Care Zinc Oxide Ointment 30g tube Discharge Instruction: Apply Zinc Oxide to periwound with each dressing change Topical Primary Dressing Vashe Discharge Instruction: apply directly to wound bed. Secondary Dressing ABD Pad, 5x9 Discharge Instruction: Apply over primary dressing as directed. Secured With [employer], 4.5x3.1 (in/yd) Discharge Instruction: Secure with Kerlix as directed. 9M Medipore H Soft Cloth Surgical T ape, 4 x 10 (in/yd) Discharge Instruction: Secure with tape as directed. Compression Wrap Compression Stockings Add-Ons Electronic Signature(s) Signed: 02/14/2023 12:08:46 PM By: Rhae Hammock RN Entered By: Rhae Hammock on 02/14/2023 08:09:36 -------------------------------------------------------------------------------- Vitals Details Patient Name: Date of Service: [name] [place], [name]  02/14/2023 8:45 A M Medical Record Number: [id number] Patient Account Number: [id number] Date of Birth/Sex: Treating RN: Nov 22, 1971 (  52 y.o. Purcell Nails, Joneen Boers ([id number]) [id number] Page 9 of 9 Primary Care Mattye Verdone: Asencion Noble Other Clinician: Referring Renad Jenniges: Treating Wilma Michaelson/Extender: Madelynn Done in Treatment: 3 Vital Signs Time Taken: 08:09 Temperature (F): 98.7 Height (in): 78 Pulse (bpm): 74 Weight (lbs): 275 Respiratory Rate (breaths/min): 17 Body Mass Index (BMI): 31.8 Blood Pressure (mmHg): 124/74 Reference Range: 80 - 120 mg / dl Electronic Signature(s) Signed: 02/14/2023 12:08:46 PM By: Rhae Hammock RN Entered By: Rhae Hammock on 02/14/2023 08:10:09

## 2023-02-15 NOTE — Progress Notes (Signed)
[name] ([id number]) [id number] Page 1 of 9 Visit Report for 02/14/2023 Chief Complaint Document Details Patient Name: Date of Service: [name] [place], [place] RO LD 02/14/2023 8:45 A M Medical Record Number: [id number] Patient Account Number: [id number] Date of Birth/Sex: Treating RN: [birth year] (52 y.o. M) Primary Care Provider: Asencion Noble Other Clinician: Referring Provider: Treating Provider/Extender: Madelynn Done in Treatment: 3 Information Obtained from: Patient Chief Complaint 01/15/2023; left foot wound Electronic Signature(s) Signed: 02/14/2023 10:13:15 AM By: Kalman Shan DO Entered By: Kalman Shan on 02/14/2023 09:30:08 -------------------------------------------------------------------------------- HPI Details Patient Name: Date of Service: [name] [place], [name] 02/14/2023 8:45 A M Medical Record Number: [id number] Patient Account Number: [id number] Date of Birth/Sex: Treating RN: [birth year] (52 y.o. M) Primary Care Provider: Asencion Noble Other Clinician: Referring Provider: Treating Provider/Extender: Madelynn Done in Treatment: 3 History of Present Illness HPI Description: ADMISSION 01/11/2020 This is a 52 year old man with uncontrolled type 2 diabetes with a recent hemoglobin A1c earlier this year of 13.4. He is on insulin and glipizide. He does not take his blood sugars at home. He does have a follow-up with primary care later this month I believe on January 27. He tells [place] that roughly a month ago he was walking with a shoe with a hole in his foot. He took the shoe off and there was an open wound at roughly the left fourth met head. This has significant undermining and raised edges. He has not noticed any purulence he does not feel unwell. More recently he was taking skin off the bottom of his foot and has a superficial area on the left fifth met head. He has not been  offloading this. The patient was in the ER on 12/20. They gave him Bactroban which she has been using on the wound and 10 days worth of doxycycline. No x-rays were done. He has not had vascular studies. He is also been using hydrogen peroxide. Past medical history type 2 diabetes uncontrolled, chronic systolic heart failure, coronary artery disease with a history of congestive heart failure with stents. Hypertension hyperlipidemia and chronic renal insufficiency ABI in this clinic was 1.14 on the left. 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 He is uncertain whether he would be able to work if we put him on some form of restriction 1/19; he is generally doing quite well. Using silver alginate on the wounds. Things actually look better. He has a forefoot offloading boot which she seems to be compliant about. He has support at work to stay off his foot is much as possible which is gratifying. Culture I did last week showed a few Enterococcus faecalis. I am going to put him on Augmentin. I talked about ordering an x-ray in my note last week but that does not seem to have happened. We will review reorder the x-ray this week. 1/26; x-ray reordered last week was negative for osteomyelitis. We are using silver alginate on the wound on the third and fifth met heads on the left. He is using a Darco forefoot off loader 2/2; the area on the fifth met head is closed. Third met head is still open with tunneling depth and thick callus. 2/9; the area on the fifth met head remains closed however the third met head again has a small open area on presentation with tunneling in depth and surrounded by thick callus. This looks like a pressure issue. We have been using silver alginate  2/16; the area of the fifth metatarsal head remains closed however the area over the third metatarsal head again is a small open area but with some depth. I do not think this is changed much since last week. He  is using Hydrofera Blue with forefoot off loader. He is not able to use a total contact cast on the left leg because he needs his left leg at work [employer]]. Fortunately the wound does not look infected. I changed him to endoform today 2/26; the area of the fifth metatarsal head remains closed. The area of the third metatarsal head has an even smaller opening this time. I used endoform on [name] ([id number]) [id number] Page 2 of 9 this this looks improved. He is offloading this is much as he can and a forefoot off loader on the left. He cannot have a total contact cast because of work responsibilities 3/5; the area on the fifth metatarsal head remains closed the area on the third metatarsal head is also closed on the left foot. 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 He has been following for podiatry for several months for this issue. He is currently using wet-to-dry saline dressings. He currently denies signs of infection. Progresse note from 1/17; Patient presents with a 538-monthistory of nonhealing ulcer to the left foot secondary to diabetes and inability to offload well the area. He has had multiple debridements in the past to his feet bilaterally. He has had resection of the fourth left met head in the fifth toe Secondary to osteomyelitis. We discussed the importance of glycemic control for wound healing. Due to his blood glucose levels being elevated he is at high risk for infection and thus further amputation. He expressed understanding. He states [facility] Unclear what happened. [facility] we discussed the importance of aggressive offloading for his wound healing. This will be the most difficult part of the treatment plan for the patient to do. We discussed a total contact cast however he has declined that at this time. 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 He is currently using a surgical shoe with a peg assist. It does not fit well so we will give him a new one today. I do not think this is enough offloading. If he is not able to offload this area he will likely end up with a BKA. He is well aware of this. For now I recommended Medihoney and Hydrofera Blue for dressing changes. [place] 1/26; patient presents for follow-up. [place] Progress note above from that visit. Over the past week He has been using Medihoney and Hydrofera Blue to the wound bed. He has been using his surgical shoe with peg assist. He has no issues or complaints today. We  discussed doing the total contact cast and he was agreeable to having this placed at next clinic visit. 2/2; patient presents for follow-up. He has been using Vashe wet-to-dry dressings. Plan is for the total contact cast today. 2/5; patient presents for follow-up. The cast was placed 3 days ago. He tolerated this well however had a lot of drainage. He currently denies signs of infection. 2/9; patient presents for follow-up. At last clinic visit we held off on replacing the cast due to drainage. He has been using Vashe wet-to-dry dressings and he is currently taking the oral antibiotics prescribed without issues. He declines a total  contact cast today. We ran insurance verification for skin substitute and due to cost patient declines having this placed. 2/16; patient presents for follow-up. He has decided not to follow-up with podiatry. He continues to decline the total contact cast. He has been using Vashe wet- to-dry dressings. He has not been wearing his surgical shoe with peg assist. He currently denies signs of infection. Electronic Signature(s) Signed: 02/14/2023 10:13:15 AM By: Kalman Shan DO Entered By: Kalman Shan on 02/14/2023 09:30:43 -------------------------------------------------------------------------------- Physical Exam Details Patient Name: Date of Service: [name] IN, [name] 02/14/2023 8:45 A M Medical Record Number: [id number] Patient Account Number: [id number] Date of Birth/Sex: Treating RN: Jun 28, 1971 (52 y.o. M) Primary Care Provider: Asencion Noble Other Clinician: Referring Provider: Treating Provider/Extender: Madelynn Done in Treatment: 3 Constitutional respirations regular, non-labored and within target range for patient.. Cardiovascular 2+ dorsalis pedis/posterior tibialis pulses. Psychiatric pleasant and cooperative. Notes Left foot: T the lateral aspect, fifth met head there is an open wound with granulation tissue. No signs of infection including increased warmth, erythema or o purulent drainage. Electronic Signature(s) Signed: 02/14/2023 10:13:15 AM By: Kalman Shan DO Entered By: Kalman Shan on 02/14/2023 [place], Satchel ([id number]) [id number] Page 3 of 9 -------------------------------------------------------------------------------- Physician Orders Details Patient Name: Date of Service: [name] [place], [place] RO LD 02/14/2023 8:45 A M Medical Record Number: [id number] Patient Account Number: [id number] Date of Birth/Sex: Treating RN: November 03, 1971 (52 y.o. [name], Lauren Primary Care Provider:  Asencion Noble Other Clinician: Referring Provider: Treating Provider/Extender: Madelynn Done in Treatment: 3 Verbal / Phone Orders: No Diagnosis Coding Follow-up Appointments ppointment in 2 weeks. - Dr. Heber [place] and Allayne Butcher Rm # 7 Friday 02/28/23 @ 9:00 Return A Other: - Call podiatry to follow with them. Anesthetic (In clinic) Topical Lidocaine 5% applied to wound bed Cellular or Tissue Based Products Cellular or Tissue Based Product Type: - apligraf and grafix 50% coinsurance Bathing/ Shower/ Hygiene May shower with protection but do not get wound dressing(s) wet. Protect dressing(s) with water repellant cover (for example, large plastic bag) or a cast cover and may then take shower. Off-Loading Other: - keep pressure off left lateral foot; use peg assist as insert in surgical shoe Wound Treatment Wound #3 - Foot Wound Laterality: Plantar, Left Cleanser: Wound Cleanser 1 x Per Day/15 Days Discharge Instructions: Cleanse the wound with wound cleanser prior to applying a clean dressing using [name] sponges, not tissue or cotton balls. Peri-Wound Care: Zinc Oxide Ointment 30g tube 1 x Per Day/15 Days Discharge Instructions: Apply Zinc Oxide to periwound with each dressing change Prim Dressing: Vashe 1 x Per Day/15 Days ary Discharge Instructions: apply directly to wound bed. Secondary Dressing: ABD Pad, 5x9 1 x Per Day/15 Days Discharge Instructions: Apply over primary dressing as directed. Secured With: [employer], 4.5x3.1 (in/yd) 1 x Per Day/15 Days  Discharge Instructions: Secure with Kerlix as directed. Secured With: 5M Medipore H Soft Cloth Surgical T ape, 4 x 10 (in/yd) 1 x Per Day/15 Days Discharge Instructions: Secure with tape as directed. Electronic Signature(s) Signed: 02/14/2023 10:13:15 AM By: Kalman Shan DO Entered By: Kalman Shan on 02/14/2023  09:31:15 -------------------------------------------------------------------------------- Problem List Details Patient Name: Date of Service: [name] [place], [name] 02/14/2023 8:45 A M Medical Record Number: [id number] Patient Account Number: [id number] Date of Birth/Sex: Treating RN: 09-06-1971 (52 y.o. M) Primary Care Provider: Asencion Noble Other Clinician: Referring Provider: Treating Provider/Extender: Elsworth Soho [place], Joneen Boers ([id number]) [id number] Page 4 of 9 Weeks in Treatment: 3 Active Problems ICD-10 Encounter Code Description Active Date MDM Diagnosis L97.522 Non-pressure chronic ulcer of other part of left foot with fat layer exposed 01/24/2023 No Yes E11.621 Type 2 diabetes mellitus with foot ulcer 01/24/2023 No Yes Z89.422 Acquired absence of other left toe(s) 01/24/2023 No Yes Inactive Problems Resolved Problems Electronic Signature(s) Signed: 02/14/2023 10:13:15 AM By: Kalman Shan DO Entered By: Kalman Shan on 02/14/2023 09:29:55 -------------------------------------------------------------------------------- Progress Note Details Patient Name: Date of Service: [name] [place], [name] 02/14/2023 8:45 A M Medical Record Number: [id number] Patient Account Number: [id number] Date of Birth/Sex: Treating RN: 1971-05-31 (52 y.o. M) Primary Care Provider: Asencion Noble Other Clinician: Referring Provider: Treating Provider/Extender: Madelynn Done in Treatment: 3 Subjective Chief Complaint Information obtained from Patient 01/15/2023; left foot wound History of Present Illness (HPI) ADMISSION 01/11/2020 This is a 52 year old man with uncontrolled type 2 diabetes with a recent hemoglobin A1c earlier this year of 13.4. He is on insulin and glipizide. He does not take his blood sugars at home. He does have a follow-up with primary care later this month I believe on January 27. He tells [place]  that roughly a month ago he was walking with a shoe with a hole in his foot. He took the shoe off and there was an open wound at roughly the left fourth met head. This has significant undermining and raised edges. He has not noticed any purulence he does not feel unwell. More recently he was taking skin off the bottom of his foot and has a superficial area on the left fifth met head. He has not been offloading this. The patient was in the ER on 12/20. They gave him Bactroban which she has been using on the wound and 10 days worth of doxycycline. No x-rays were done. He has not had vascular studies. He is also been using hydrogen peroxide. Past medical history type 2 diabetes uncontrolled, chronic systolic heart failure, coronary artery disease with a history of congestive heart failure with stents. Hypertension hyperlipidemia and chronic renal insufficiency ABI in this clinic was 1.14 on the left. 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 He is uncertain whether he would be able to work if we put him on some form of restriction 1/19; he is generally doing quite well. Using silver alginate on the wounds. Things actually look better. He has a forefoot offloading boot which she seems to be compliant about. He has support at work to stay off his foot is much as possible which is gratifying. Culture I did last week showed a few Enterococcus faecalis. I am going to put him on Augmentin. I talked about ordering an x-ray in my note last week but that does not seem to have happened. We will review reorder the x-ray this week. 1/26;  x-ray reordered last week was negative for osteomyelitis. We are using silver alginate on the wound on the third and fifth met heads on the left. He is using a Darco forefoot off loader 2/2; the area on the fifth met head is closed. Third met head is still open with tunneling depth and thick callus. 2/9; the area on the fifth met head remains closed  however the third met head again has a small open area on presentation with tunneling in depth and CONARD, MCCROHAN ([id number]) [id number] Page 5 of 9 surrounded by thick callus. This looks like a pressure issue. We have been using silver alginate 2/16; the area of the fifth metatarsal head remains closed however the area over the third metatarsal head again is a small open area but with some depth. I do not think this is changed much since last week. He is using Hydrofera Blue with forefoot off loader. He is not able to use a total contact cast on the left leg because he needs his left leg at work [employer]]. Fortunately the wound does not look infected. I changed him to endoform today 2/26; the area of the fifth metatarsal head remains closed. The area of the third metatarsal head has an even smaller opening this time. I used endoform on this this looks improved. He is offloading this is much as he can and a forefoot off loader on the left. He cannot have a total contact cast because of work responsibilities 3/5; the area on the fifth metatarsal head remains closed the area on the third metatarsal head is also closed on the left foot. 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 He has been following for podiatry for several months for this issue. He is currently using wet-to-dry saline dressings. He currently denies signs of infection. Progresse note from 1/17; Patient presents with a 554-monthistory of nonhealing ulcer to the left foot secondary to diabetes and inability to offload well the area. He has  had multiple debridements in the past to his feet bilaterally. He has had resection of the fourth left met head in the fifth toe Secondary to osteomyelitis. We discussed the importance of glycemic control for wound healing. Due to his blood glucose levels being elevated he is at high risk for infection and thus further amputation. He expressed understanding. He states [facility]. Unclear what happened. [facility] we discussed the importance of aggressive offloading for his wound healing. This will be the most difficult part of the treatment plan for the patient to do. We discussed a total contact cast however he has declined that at this time. 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 He is currently using a surgical shoe with a peg assist. It does not fit well so we will give him a new one today. I do not think this is enough offloading. If he is not able to offload this area he will likely end up with  a BKA. He is well aware of this. For now I recommended Medihoney and Hydrofera Blue for dressing changes. He will follow-up in our [place] office Since this is closer for him. 1/26; patient presents for follow-up. Last clinic visit I had seen [place] in [place] and we transferred him to [place] since this is a closer location for him. Progress note above from that visit. Over the past week He has been using Medihoney and Hydrofera Blue to the wound bed. He has been using his surgical shoe with peg assist. He has no issues or complaints today. We discussed doing the total contact cast and he was agreeable to having this placed at next clinic visit. 2/2; patient presents for follow-up. He has been using Vashe wet-to-dry dressings. Plan is for the total contact cast today. 2/5;  patient presents for follow-up. The cast was placed 3 days ago. He tolerated this well however had a lot of drainage. He currently denies signs of infection. 2/9; patient presents for follow-up. At last clinic visit we held off on replacing the cast due to drainage. He has been using Vashe wet-to-dry dressings and he is currently taking the oral antibiotics prescribed without issues. He declines a total contact cast today. We ran insurance verification for skin substitute and due to cost patient declines having this placed. 2/16; patient presents for follow-up. He has decided not to follow-up with podiatry. He continues to decline the total contact cast. He has been using Vashe wet- to-dry dressings. He has not been wearing his surgical shoe with peg assist. He currently denies signs of infection. Patient History Information obtained from Patient. Family History Heart Disease - Mother,Father, No family history of Cancer, Diabetes, Hereditary Spherocytosis, Hypertension, Kidney Disease, Lung Disease, Seizures, Stroke, Thyroid Problems, Tuberculosis. Social History Never smoker, Marital Status - Single, Alcohol Use - Rarely, Drug Use - No History, Caffeine Use - Rarely. 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 31 Denies history of Angina, Arrhythmia, Deep Vein Thrombosis, Hypotension, Peripheral Arterial Disease, Peripheral Venous Disease, Phlebitis,  Vasculitis Gastrointestinal Denies history of Cirrhosis , Colitis, Crohnoos, Hepatitis A, Hepatitis B, Hepatitis C Endocrine Patient has history of Type II Diabetes Genitourinary Denies history of End Stage Renal Disease Immunological Denies history of Lupus Erythematosus, Raynaudoos, Scleroderma Integumentary (Skin) Denies history of History of Burn Musculoskeletal Denies history of Gout, Rheumatoid Arthritis, Osteoarthritis, Osteomyelitis Neurologic Patient has history of Neuropathy Denies history of Dementia, Quadriplegia, Paraplegia, Seizure Disorder Oncologic Denies history of Received Chemotherapy, Received Radiation Psychiatric Denies history of Anorexia/bulimia, Confinement Anxiety Hospitalization/Surgery History - Heart Cath in 2018. Medical A Surgical History Notes nd Cardiovascular ZARYN, LOREE ([id number]) [id number] Page 6 of 9 Ischemic Cardiomyopathy Genitourinary Renal Insufficiency Objective Constitutional respirations regular, non-labored and within target range for patient.. Vitals Time Taken: 8:09 AM, Height: 78 in, Weight: 275 lbs, BMI: 31.8, Temperature: 98.7 F, Pulse: 74 bpm, Respiratory Rate: 17 breaths/min, Blood Pressure: 124/74 mmHg. Cardiovascular 2+ dorsalis pedis/posterior tibialis pulses. Psychiatric pleasant and cooperative. General Notes: Left foot: T the lateral aspect, fifth met head there is an open wound with granulation tissue. No signs of infection including increased warmth, o erythema or purulent  drainage. Integumentary (Hair, Skin) Wound #3 status is Open. Original cause of wound was Gradually Appeared. The date acquired was: 10/01/2022. The wound has been in treatment 3 weeks. The wound is located on the [place]. The wound measures 1.7cm length x 1.2cm width x 0.2cm depth; 1.602cm^2 area and 0.32cm^3 volume. There is Fat Layer (Subcutaneous Tissue) exposed. There is no tunneling or  undermining noted. There is a medium amount of serosanguineous drainage noted. The wound margin is distinct with the outline attached to the wound base. There is large (67-100%) red granulation within the wound bed. There is no necrotic tissue within the wound bed. The periwound skin appearance did not exhibit: Callus, Crepitus, Excoriation, Induration, Rash, Scarring, Dry/Scaly, Maceration, Atrophie Blanche, Cyanosis, Ecchymosis, Hemosiderin Staining, Mottled, Pallor, Rubor, Erythema. Periwound temperature was noted as No Abnormality. The periwound has tenderness on palpation. 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 We discussed the importance of aggressive offloading for wound healing. We gave him another peg assist today to use with his surgical shoe. No signs of infection. He is at high risk for amputation since he is not able to aggressively offload this area and continues to decline the total contact cast. Due to cost at this time patient declines skin substitute. We will run another skin substitutes to see if this will be covered by his insurance. For now continue Vashe wet-to-dry dressings. Plan Follow-up Appointments: Return Appointment in 2 weeks. - Dr. Heber Waldo and Allayne Butcher Rm # 7 Friday 02/28/23 @ 9:00 Other: - Call podiatry to follow with them. Anesthetic: (In clinic) Topical Lidocaine 5% applied to wound bed Cellular or Tissue Based Products: Cellular or Tissue Based Product Type: - apligraf and grafix 50% coinsurance Bathing/ Shower/ Hygiene: May shower with protection but do not get wound dressing(s) wet. Protect dressing(s) with water repellant cover (for example, large plastic bag) or a cast cover and may then take shower. Off-Loading: Other: - keep pressure off left lateral foot; use peg assist as  insert in surgical shoe WOUND #3: - Foot Wound Laterality: Plantar, Left Cleanser: Wound Cleanser 1 x Per Day/15 Days Discharge Instructions: Cleanse the wound with wound cleanser prior to applying a clean dressing using [name] sponges, not tissue or cotton balls. Peri-Wound Care: Zinc Oxide Ointment 30g tube 1 x Per Day/15 Days Discharge Instructions: Apply Zinc Oxide to periwound with each dressing change Prim Dressing: Vashe 1 x Per Day/15 Days ary Discharge Instructions: apply directly to wound bed. Secondary Dressing: ABD Pad, 5x9 1 x Per Day/15 Days Discharge Instructions: Apply over primary dressing as directed. Secured With: [employer], 4.5x3.1 (in/yd) 1 x Per Day/15 Days OBA, TREJOS ([id number]) [id number] Page 7 of 9 Discharge Instructions: Secure with Kerlix as directed. Secured With: 68M Medipore H Soft Cloth Surgical T ape, 4 x 10 (in/yd) 1 x Per Day/15 Days Discharge Instructions: Secure with tape as directed. 1. Vashe wet-to-dry dressings 2. Aggressive offloadingoosurgical shoe with peg assist 3. Follow-up in 2 weeks Electronic Signature(s) Signed: 02/14/2023 10:13:15 AM By: Kalman Shan DO Entered By: Kalman Shan on 02/14/2023 09:41:06 -------------------------------------------------------------------------------- HxROS Details Patient Name: Date of Service: [name] IN, [name] 02/14/2023 8:45 A M Medical Record Number: [id number] Patient Account Number: [id number] Date of Birth/Sex: Treating RN: 07-01-1971 (52 y.o. M) Primary Care Provider: Asencion Noble Other Clinician: Referring Provider: Treating Provider/Extender: Heber [place]  Shanon Rosser, Lurline Del in Treatment: 3 Information Obtained From Patient Eyes Medical History: Negative for: Cataracts; Glaucoma; Optic Neuritis Ear/Nose/Mouth/Throat Medical History: Positive for: Chronic sinus problems/congestion Negative for: Middle ear  problems Hematologic/Lymphatic Medical History: Negative for: Anemia; Hemophilia; Human Immunodeficiency Virus; Lymphedema; Sickle Cell Disease Respiratory Medical History: Negative for: Aspiration; Asthma; Chronic Obstructive Pulmonary Disease (COPD); Pneumothorax; Sleep Apnea; Tuberculosis Cardiovascular Medical History: Positive for: Congestive Heart Failure; Coronary Artery Disease; Hypertension; Myocardial Infarction - age 31 Negative for: Angina; Arrhythmia; Deep Vein Thrombosis; Hypotension; Peripheral Arterial Disease; Peripheral Venous Disease; Phlebitis; Vasculitis Past Medical History Notes: Ischemic Cardiomyopathy Gastrointestinal Medical History: Negative for: Cirrhosis ; Colitis; Crohns; Hepatitis A; Hepatitis B; Hepatitis C Endocrine Medical History: Positive for: Type II Diabetes Time with diabetes: ten years Treated with: Insulin Blood sugar tested every day: No MACK, CZAJA ([id number]) [id number] Page 8 of 9 Genitourinary Medical History: Negative for: End Stage Renal Disease Past Medical History Notes: Renal Insufficiency Immunological Medical History: Negative for: Lupus Erythematosus; Raynauds; Scleroderma Integumentary (Skin) Medical History: Negative for: History of Burn Musculoskeletal Medical History: Negative for: Gout; Rheumatoid Arthritis; Osteoarthritis; Osteomyelitis Neurologic Medical History: Positive for: Neuropathy Negative for: Dementia; Quadriplegia; Paraplegia; Seizure Disorder Oncologic Medical History: Negative for: Received Chemotherapy; Received Radiation Psychiatric Medical History: Negative for: Anorexia/bulimia; Confinement Anxiety HBO Extended History Items Ear/Nose/Mouth/Throat: Chronic sinus problems/congestion Immunizations Pneumococcal Vaccine: Received Pneumococcal Vaccination: No Implantable Devices None Hospitalization / Surgery History Type of Hospitalization/Surgery Heart  Cath in 2018 Family and Social History Cancer: No; Diabetes: No; Heart Disease: Yes - Mother,Father; Hereditary Spherocytosis: No; Hypertension: No; Kidney Disease: No; Lung Disease: No; Seizures: No; Stroke: No; Thyroid Problems: No; Tuberculosis: No; Never smoker; Marital Status - Single; Alcohol Use: Rarely; Drug Use: No History; Caffeine Use: Rarely; Financial Concerns: No; Food, Clothing or Shelter Needs: No; Support System Lacking: No; Transportation Concerns: No Electronic Signature(s) Signed: 02/14/2023 10:13:15 AM By: Kalman Shan DO Entered By: Kalman Shan on 02/14/2023 09:30:49 -------------------------------------------------------------------------------- SuperBill Details Patient Name: Date of Service: [name] IN, [name] 02/14/2023 Medical Record Number: [id number] Patient Account Number: [id number] Date of Birth/Sex: Treating RN: 04-Feb-1971 (52 y.o. 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

## 2023-02-17 ENCOUNTER — Other Ambulatory Visit: Payer: Self-pay

## 2023-02-18 ENCOUNTER — Other Ambulatory Visit (HOSPITAL_COMMUNITY): Payer: Self-pay

## 2023-02-18 ENCOUNTER — Other Ambulatory Visit: Payer: Self-pay

## 2023-02-24 ENCOUNTER — Other Ambulatory Visit: Payer: Self-pay

## 2023-02-28 ENCOUNTER — Encounter (HOSPITAL_BASED_OUTPATIENT_CLINIC_OR_DEPARTMENT_OTHER): Payer: 59 | Attending: Internal Medicine | Admitting: Internal Medicine

## 2023-02-28 ENCOUNTER — Other Ambulatory Visit: Payer: Self-pay

## 2023-02-28 DIAGNOSIS — Z89422 Acquired absence of other left toe(s): Secondary | ICD-10-CM | POA: Diagnosis not present

## 2023-02-28 DIAGNOSIS — I5042 Chronic combined systolic (congestive) and diastolic (congestive) heart failure: Secondary | ICD-10-CM | POA: Insufficient documentation

## 2023-02-28 DIAGNOSIS — I252 Old myocardial infarction: Secondary | ICD-10-CM | POA: Insufficient documentation

## 2023-02-28 DIAGNOSIS — I130 Hypertensive heart and chronic kidney disease with heart failure and stage 1 through stage 4 chronic kidney disease, or unspecified chronic kidney disease: Secondary | ICD-10-CM | POA: Diagnosis not present

## 2023-02-28 DIAGNOSIS — Z794 Long term (current) use of insulin: Secondary | ICD-10-CM | POA: Insufficient documentation

## 2023-02-28 DIAGNOSIS — I2510 Atherosclerotic heart disease of native coronary artery without angina pectoris: Secondary | ICD-10-CM | POA: Diagnosis not present

## 2023-02-28 DIAGNOSIS — E1140 Type 2 diabetes mellitus with diabetic neuropathy, unspecified: Secondary | ICD-10-CM | POA: Diagnosis not present

## 2023-02-28 DIAGNOSIS — Z955 Presence of coronary angioplasty implant and graft: Secondary | ICD-10-CM | POA: Diagnosis not present

## 2023-02-28 DIAGNOSIS — E11621 Type 2 diabetes mellitus with foot ulcer: Secondary | ICD-10-CM | POA: Insufficient documentation

## 2023-02-28 DIAGNOSIS — Z7984 Long term (current) use of oral hypoglycemic drugs: Secondary | ICD-10-CM | POA: Insufficient documentation

## 2023-02-28 DIAGNOSIS — L97522 Non-pressure chronic ulcer of other part of left foot with fat layer exposed: Secondary | ICD-10-CM | POA: Diagnosis not present

## 2023-02-28 DIAGNOSIS — N189 Chronic kidney disease, unspecified: Secondary | ICD-10-CM | POA: Diagnosis not present

## 2023-03-03 ENCOUNTER — Other Ambulatory Visit: Payer: Self-pay

## 2023-03-03 NOTE — Progress Notes (Signed)
NIJAH, MURFIN ([id number]) [id number] Page 1 of 9 Visit Report for 02/28/2023 Chief Complaint Document Details Patient Name: Date of Service: [name] [place], [place] RO LD 02/28/2023 9:00 A M Medical Record Number: [id number] Patient Account Number: [id number] Date of Birth/Sex: Treating RN: October 27, 1971 (52 y.o. M) Primary Care Provider: Asencion Noble Other Clinician: Referring Provider: Treating Provider/Extender: Madelynn Done in Treatment: 5 Information Obtained from: Patient Chief Complaint 01/15/2023; left foot wound Electronic Signature(s) Signed: 02/28/2023 12:11:35 PM By: Kalman Shan DO Entered By: Kalman Shan on 02/28/2023 10:24:58 -------------------------------------------------------------------------------- Cellular or Tissue Based Product Details Patient Name: Date of Service: [name] [place], [name] 02/28/2023 9:00 A M Medical Record Number: [id number] Patient Account Number: [id number] Date of Birth/Sex: Treating RN: 1971/10/12 (52 y.o. [name] Primary Care Provider: Asencion Noble Other Clinician: Referring Provider: Treating Provider/Extender: Madelynn Done in Treatment: 5 Cellular or Tissue Based Product Type Wound #3 Left,Plantar Foot Applied to: Performed By: Physician Kalman Shan, DO Cellular or Tissue Based Product Type: Grafix prime Level of Consciousness (Pre-procedure): Awake and Alert Pre-procedure Verification/Time Out Yes - 09:25 Taken: Location: genitalia / hands / feet / multiple digits Wound Size (sq cm): 1.68 Product Size (sq cm): 2 Waste Size (sq cm): 0 Amount of Product Applied (sq cm): 2 Instrument Used: Forceps, Scissors Lot #: [id number] Order #: 1 Expiration Date: 03/25/2024 Fenestrated: No Reconstituted: Yes Solution Type: normal saline Solution Amount: 55m Lot #: [id number] Expiration Date: 05/29/2025 Secured: Yes Secured With:  Steri-Strips, adaptic Dressing Applied: No Procedural Pain: 0 Post Procedural Pain: 0 Response to Treatment: Procedure was tolerated well Level of Consciousness (Post- Awake and Alert procedure): Post Procedure Diagnosis Same as Pre-procedure Electronic Signature(s) Signed: 02/28/2023 12:11:35 PM By: HKalman ShanDO Signed: 02/28/2023 3:08:51 PM By: DDeon PillingRN, BSN Entered By: DDeon Pillingon 02/28/2023 09:27:53 [name]([id number] Page 2 of 9 -------------------------------------------------------------------------------- HPI Details Patient Name: Date of Service: [place] 02/28/2023 9:00 A M Medical Record Number: [id number] Account Number: [id number] of Birth/Sex: Treating RN: [birth year](52y.o. M) Primary Care Provider: WAsencion NobleOther Clinician: Referring Provider: Treating Provider/Extender: HMadelynn Donein Treatment: 5 History of Present Illness HPI Description: ADMISSION 01/11/2020 This is a 52year old man with uncontrolled type 2 diabetes with a recent hemoglobin A1c earlier this year of 13.4. He is on insulin and glipizide. He does not take his blood sugars at home. He does have a follow-up with primary care later this month I believe on January 27. He tells [place] roughly a month ago he was walking with a shoe with a hole in his foot. He took the shoe off and there was an open wound at roughly the left fourth met head. This has significant undermining and raised edges. He has not noticed any purulence he does not feel unwell. More recently he was taking skin off the bottom of his foot and has a superficial area on the left fifth met head. He has not been offloading this. The patient was in the ER on 12/20. They gave him Bactroban which she has been using on the wound and 10 days worth of doxycycline. No x-rays were done. He has not had vascular studies. He is  also been using hydrogen peroxide. Past medical history type 2 diabetes uncontrolled, chronic systolic heart failure, coronary artery disease with a history of congestive heart failure with stents. Hypertension hyperlipidemia and chronic renal insufficiency ABI  in this clinic was 1.14 on the left. 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 He is uncertain whether he would be able to work if we put him on some form of restriction 1/19; he is generally doing quite well. Using silver alginate on the wounds. Things actually look better. He has a forefoot offloading boot which she seems to be compliant about. He has support at work to stay off his foot is much as possible which is gratifying. Culture I did last week showed a few Enterococcus faecalis. I am going to put him on Augmentin. I talked about ordering an x-ray in my note last week but that does not seem to have happened. We will review reorder the x-ray this week. 1/26; x-ray reordered last week was negative for osteomyelitis. We are using silver alginate on the wound on the third and fifth met heads on the left. He is using a Darco forefoot off loader 2/2; the area on the fifth met head is closed. Third met head is still open with tunneling depth and thick callus. 2/9; the area on the fifth met head remains closed however the third met head again has a small open area on presentation with tunneling in depth and surrounded by thick callus. This looks like a pressure issue. We have been using silver alginate 2/16; the area of the fifth metatarsal head remains closed however the area over the third metatarsal head again is a small open area but with some depth. I do not think this is changed much since last week. He is using Hydrofera Blue with forefoot off loader. He is not able to use a total contact cast on the left leg because he needs his left leg at work [employer]]. Fortunately the wound does not look  infected. I changed him to endoform today 2/26; the area of the fifth metatarsal head remains closed. The area of the third metatarsal head has an even smaller opening this time. I used endoform on this this looks improved. He is offloading this is much as he can and a forefoot off loader on the left. He cannot have a total contact cast because of work responsibilities 3/5; the area on the fifth metatarsal head remains closed the area on the third metatarsal head is also closed on the left foot. 01/15/2023 Mr. 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 He has been following for podiatry for several months for this issue. He is currently using wet-to-dry saline dressings. He currently denies signs of infection. Progresse note from 1/17; Patient presents with a 520-monthistory of nonhealing ulcer to the left foot secondary to diabetes and inability to offload well the area. He has had multiple debridements in the past to his feet bilaterally. He has had resection of the fourth left met head in the fifth toe Secondary to osteomyelitis. We discussed the importance of glycemic control for wound healing. Due to his blood glucose levels being elevated he is at high risk for infection and thus further amputation. He expressed understanding. He states [facility] Unclear what happened. Offered  a referral to endocrinology at [facility] [place]. Patient was agreeable.Furthermore we discussed the importance of aggressive offloading for his wound healing. This will be the most difficult part of the  treatment plan for the patient to do. We discussed a total contact cast however he has declined that at this time. 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 He is currently using a surgical shoe with a peg assist. It does not fit well so we will give him a new one today. I do not think this is enough offloading. If he is not able to offload this area he will likely end up with a BKA. He is well aware of this. For now I recommended Medihoney and Hydrofera Blue for dressing changes. He will follow-up in our [place] office Since this is closer for him. 1/26; patient presents for follow-up. Last clinic visit I had seen him in [place] and we transferred him to [place] since this is a closer location for him. Progress note above from that visit. Over the past week He has been using Medihoney and Hydrofera Blue to the wound bed. He has been using his surgical shoe with peg assist. He has no issues or complaints today. We discussed doing the total contact cast and he was agreeable to having this placed at next clinic visit. 2/2; patient presents for follow-up. He has been using Vashe wet-to-dry dressings. Plan is for the total contact cast today. 2/5; patient presents for follow-up. The cast was placed 3 days ago. He tolerated this well however had a lot of drainage. He currently denies signs of infection. 2/9; patient presents for follow-up. At last clinic visit we held off on replacing the cast due to drainage. He has been using Vashe wet-to-dry dressings and he is currently taking the oral antibiotics prescribed without issues. He declines a total contact cast today. We ran insurance verification for skin substitute and due to cost patient declines having this placed. 2/16; patient presents for follow-up. He has decided not to follow-up with podiatry. He continues to decline the total contact cast. He has been using Vashe  wet- to-dry dressings. He has not been wearing his surgical shoe with peg assist. He currently denies signs of infection. 3/1; patient presents for follow-up. He has been doing Vashe wet-to-dry dressings. He has a surgical shoe with peg assist. We discussed potentially doing a skin substitute for which she has been approved for by insurance. He knows the out-of-pocket cost of this and would still like to proceed with having this placed today. He denies the current total contact cast. [name], [name] ([id number]) [id number] Page 3 of 9 Electronic Signature(s) Signed: 02/28/2023 12:11:35 PM By: Kalman Shan DO Entered By: Kalman Shan on 02/28/2023 10:25:40 -------------------------------------------------------------------------------- Physical Exam Details Patient Name: Date of Service: [name] [place], [name] 02/28/2023 9:00 A M Medical Record Number: [id number] Patient Account Number: [id number] Date of Birth/Sex: Treating RN: [birth year] (52 y.o. M) Primary Care Provider: Asencion Noble Other Clinician: Referring Provider: Treating Provider/Extender: Madelynn Done in Treatment: 5 Constitutional respirations regular, non-labored and within target range for patient.. Cardiovascular 2+ dorsalis pedis/posterior tibialis pulses. Psychiatric pleasant and cooperative. Notes Left foot: T the lateral aspect, fifth met head there is an open wound with granulation tissue. No signs of infection including increased warmth, erythema or o purulent drainage. Electronic Signature(s) Signed: 02/28/2023 12:11:35 PM By: Kalman Shan DO  Entered By: Kalman Shan on 02/28/2023 10:26:00 -------------------------------------------------------------------------------- Physician Orders Details Patient Name: Date of Service: [name] [place], [place] RO LD 02/28/2023 9:00 A M Medical Record Number: [id number] Patient Account Number: [id number] Date of  Birth/Sex: Treating RN: 1971-04-28 (52 y.o. [name], Lauren Primary Care Provider: Asencion Noble Other Clinician: Referring Provider: Treating Provider/Extender: Madelynn Done in Treatment: 5 Verbal / Phone Orders: No Diagnosis Coding Follow-up Appointments ppointment in 1 week. - DR. Katlin Bortner and Bobbi Rm # 8 Friday 03/07/23 @ 9:00 Return A ppointment in 2 weeks. - Dr. Heber [place] and Allayne Butcher Rm # 9 Friday 03/14/23 @ 9:30 Return A Other: - Call podiatry to follow with them. Anesthetic (In clinic) Topical Lidocaine 5% applied to wound bed Cellular or Tissue Based Products Cellular or Tissue Based Product Type: - apligraf and grafix 50% coinsurance 02/28/23-apply 1st Grafix Bathing/ Shower/ Hygiene May shower with protection but do not get wound dressing(s) wet. Protect dressing(s) with water repellant cover (for example, large plastic bag) or a cast cover and may then take shower. Off-Loading Other: - keep pressure off left lateral foot; use peg assist as insert in surgical shoe Wound Treatment Wound #3 - Foot Wound Laterality: Plantar, Left Cleanser: Wound Cleanser 1 x Per Day/15 Days Discharge Instructions: Cleanse the wound with wound cleanser prior to applying a clean dressing using [name] sponges, not tissue or cotton balls. Peri-Wound Care: Zinc Oxide Ointment 30g tube 1 x Per Day/15 Days Discharge Instructions: Apply Zinc Oxide to periwound with each dressing change [name], [name] ([id number]) [id number] Page 4 of 9 Prim Dressing: Grafix 1 x Per Day/15 Days ary Discharge Instructions: apply directly to wound bed. Secondary Dressing: ABD Pad, 5x9 1 x Per Day/15 Days Discharge Instructions: Apply over primary dressing as directed. Secured With: [employer], 4.5x3.1 (in/yd) 1 x Per Day/15 Days Discharge Instructions: Secure with Kerlix as directed. Secured With: 44M Medipore H Soft Cloth Surgical T ape, 4 x 10  (in/yd) 1 x Per Day/15 Days Discharge Instructions: Secure with tape as directed. Electronic Signature(s) Signed: 02/28/2023 12:11:35 PM By: Kalman Shan DO Entered By: Kalman Shan on 02/28/2023 10:26:08 -------------------------------------------------------------------------------- Problem List Details Patient Name: Date of Service: [name] [place], [name] 02/28/2023 9:00 A M Medical Record Number: [id number] Patient Account Number: [id number] Date of Birth/Sex: Treating RN: Oct 17, 1971 (52 y.o. M) Primary Care Provider: Asencion Noble Other Clinician: Referring Provider: Treating Provider/Extender: Madelynn Done in Treatment: 5 Active Problems ICD-10 Encounter Code Description Active Date MDM Diagnosis [phone] Non-pressure chronic ulcer of other part of left foot with fat layer exposed 01/24/2023 No Yes E11.621 Type 2 diabetes mellitus with foot ulcer 01/24/2023 No Yes Z89.422 Acquired absence of other left toe(s) 01/24/2023 No Yes Inactive Problems Resolved Problems Electronic Signature(s) Signed: 02/28/2023 12:11:35 PM By: Kalman Shan DO Entered By: Kalman Shan on 02/28/2023 10:24:46 -------------------------------------------------------------------------------- Progress Note Details Patient Name: Date of Service: [name] [place], [name] 02/28/2023 9:00 A M Medical Record Number: [id number] Patient Account Number: [id number] Date of Birth/Sex: Treating RN: September 24, 1971 (52 y.o. M) Primary Care Provider: Asencion Noble Other Clinician: Referring Provider: Treating Provider/Extender: Madelynn Done in Treatment: 5 Subjective Chief Complaint Information obtained from Patient [name], [name] ([id number]) [id number] Page 5 of 9 01/15/2023; left foot wound History of Present Illness (HPI) ADMISSION 01/11/2020 This is a 52 year old man with uncontrolled type 2 diabetes with a recent hemoglobin  A1c earlier this year of 13.4. He is on insulin and glipizide. He  does not take his blood sugars at home. He does have a follow-up with primary care later this month I believe on January 27. He tells [place] that roughly a month ago he was walking with a shoe with a hole in his foot. He took the shoe off and there was an open wound at roughly the left fourth met head. This has significant undermining and raised edges. He has not noticed any purulence he does not feel unwell. More recently he was taking skin off the bottom of his foot and has a superficial area on the left fifth met head. He has not been offloading this. The patient was in the ER on 12/20. They gave him Bactroban which she has been using on the wound and 10 days worth of doxycycline. No x-rays were done. He has not had vascular studies. He is also been using hydrogen peroxide. Past medical history type 2 diabetes uncontrolled, chronic systolic heart failure, coronary artery disease with a history of congestive heart failure with stents. Hypertension hyperlipidemia and chronic renal insufficiency ABI in this clinic was 1.14 on the left. 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 He is uncertain whether he would be able to work if we put him on some form of restriction 1/19; he is generally doing quite well. Using silver alginate on the wounds. Things actually look better. He has a forefoot offloading boot which she seems to be compliant about. He has support at work to stay off his foot is much as possible which is gratifying. Culture I did last week showed a few Enterococcus faecalis. I am going to put him on Augmentin. I talked about ordering an x-ray in my note last week but that does not seem to have happened. We will review reorder the x-ray this week. 1/26; x-ray reordered last week was negative for osteomyelitis. We are using silver alginate on the wound on the third and fifth met heads on the left. He  is using a Darco forefoot off loader 2/2; the area on the fifth met head is closed. Third met head is still open with tunneling depth and thick callus. 2/9; the area on the fifth met head remains closed however the third met head again has a small open area on presentation with tunneling in depth and surrounded by thick callus. This looks like a pressure issue. We have been using silver alginate 2/16; the area of the fifth metatarsal head remains closed however the area over the third metatarsal head again is a small open area but with some depth. I do not think this is changed much since last week. He is using Hydrofera Blue with forefoot off loader. He is not able to use a total contact cast on the left leg because he needs his left leg at work [employer]]. Fortunately the wound does not look infected. I changed him to endoform today 2/26; the area of the fifth metatarsal head remains closed. The area of the third metatarsal head has an even smaller opening this time. I used endoform on this this looks improved. He is offloading this is much as he can and a forefoot off loader on the left. He cannot have a total contact cast because of work responsibilities 3/5; the area on the fifth metatarsal head remains closed the area on the third metatarsal head is also closed on the left foot. 01/15/2023 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 He has been following for podiatry for several months for this issue. He is currently using wet-to-dry saline dressings. He currently denies signs of infection. Progresse note from 1/17; Patient presents with a  573-monthistory of nonhealing ulcer to the left foot secondary to diabetes and inability to offload well the area. He has had multiple debridements in the past to his feet bilaterally. He has had resection of the fourth left met head in the fifth toe Secondary to osteomyelitis. We discussed the importance of glycemic control for wound healing. Due to his blood glucose levels being elevated he is at high risk for infection and thus further amputation. He expressed understanding. He states [facility] Unclear what happened. [facility] we discussed the importance of aggressive offloading for his wound healing. This will be the most difficult part of the treatment plan for the patient to do. We discussed a total contact cast however he has declined that at this time. 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 He is currently using a surgical shoe with a peg assist. It does not fit well so we will give him a new one today. I do not think this is enough offloading. If he is not able to offload this area he will likely end up with a BKA. He is well aware of this. For now I recommended Medihoney and Hydrofera Blue for dressing changes. [place] 1/26; patient presents for follow-up. [place] Progress note above from that visit. Over the past week He has been using Medihoney and Hydrofera Blue to the wound bed. He has been using his surgical shoe with peg assist. He has no issues or complaints today. We discussed doing the total contact cast and he was agreeable to having this placed at next clinic visit. 2/2;  patient presents for follow-up. He has been using Vashe wet-to-dry dressings. Plan is for the total contact cast today. 2/5; patient presents for follow-up. The cast was placed 3 days ago. He tolerated this well however had a lot of drainage. He currently denies signs of infection. 2/9; patient presents for follow-up. At last clinic visit we held off on replacing the cast due to drainage. He has been using Vashe wet-to-dry dressings and he is currently taking the oral antibiotics prescribed without issues. He declines a total contact cast today. We ran insurance verification for skin substitute and due to cost patient declines having this placed. 2/16; patient presents for follow-up. He has decided not to follow-up with podiatry. He continues to decline the total contact cast. He has been using Vashe wet- to-dry dressings. He has not been wearing his surgical shoe with peg assist. He currently denies signs of infection. 3/1; patient presents for follow-up. He has been doing Vashe wet-to-dry dressings. He has a surgical shoe with peg  assist. We discussed potentially doing a skin substitute for which she has been approved for by insurance. He knows the out-of-pocket cost of this and would still like to proceed with having this placed today. He denies the current total contact cast. Patient History Information obtained from Patient. Family History Heart Disease - Mother,Father, No family history of Cancer, Diabetes, Hereditary Spherocytosis, Hypertension, Kidney Disease, Lung Disease, Seizures, Stroke, Thyroid Problems, Tuberculosis. Social History Never smoker, Marital Status - Single, Alcohol Use - Rarely, Drug Use - No History, Caffeine Use - Rarely. Medical History Eyes Denies history of Cataracts, Glaucoma, Optic Neuritis KIMMY, PIPITONE ([id number]) [id number] Page 6 of 9 Ear/Nose/Mouth/Throat Patient has history of Chronic sinus problems/congestion Denies  history of Middle ear problems Hematologic/Lymphatic Denies history of Anemia, Hemophilia, Human Immunodeficiency Virus, Lymphedema, Sickle Cell Disease Respiratory Denies history of Aspiration, Asthma, Chronic Obstructive Pulmonary Disease (COPD), Pneumothorax, Sleep Apnea, Tuberculosis Cardiovascular Patient has history of Congestive Heart Failure, Coronary Artery Disease, Hypertension, Myocardial Infarction - age 61 Denies history of Angina, Arrhythmia, Deep Vein Thrombosis, Hypotension, Peripheral Arterial Disease, Peripheral Venous Disease, Phlebitis, Vasculitis Gastrointestinal Denies history of Cirrhosis , Colitis, Crohnoos, Hepatitis A, Hepatitis B, Hepatitis C Endocrine Patient has history of Type II Diabetes Genitourinary Denies history of End Stage Renal Disease Immunological Denies history of Lupus Erythematosus, Raynaudoos, Scleroderma Integumentary (Skin) Denies history of History of Burn Musculoskeletal Denies history of Gout, Rheumatoid Arthritis, Osteoarthritis, Osteomyelitis Neurologic Patient has history of Neuropathy Denies history of Dementia, Quadriplegia, Paraplegia, Seizure Disorder Oncologic Denies history of Received Chemotherapy, Received Radiation Psychiatric Denies history of Anorexia/bulimia, Confinement Anxiety Hospitalization/Surgery History - Heart Cath in 2018. Medical A Surgical History Notes nd Cardiovascular Ischemic Cardiomyopathy Genitourinary Renal Insufficiency Objective Constitutional respirations regular, non-labored and within target range for patient.. Vitals Time Taken: 8:28 AM, Height: 78 in, Weight: 275 lbs, BMI: 31.8, Temperature: 98.7 F, Pulse: 74 bpm, Respiratory Rate: 17 breaths/min, Blood Pressure: 132/93 mmHg. Cardiovascular 2+ dorsalis pedis/posterior tibialis pulses. Psychiatric pleasant and cooperative. General Notes: Left foot: T the lateral aspect, fifth met head there is an open wound with granulation tissue.  No signs of infection including increased warmth, o erythema or purulent drainage. Integumentary (Hair, Skin) Wound #3 status is Open. Original cause of wound was Gradually Appeared. The date acquired was: 10/01/2022. The wound has been in treatment 5 weeks. The wound is located on the [place]. The wound measures 1.4cm length x 1.2cm width x 0.2cm depth; 1.319cm^2 area and 0.264cm^3 volume. There is Fat Layer (Subcutaneous Tissue) exposed. There is no tunneling or undermining noted. There is a medium amount of serosanguineous drainage noted. The wound margin is distinct with the outline attached to the wound base. There is large (67-100%) red granulation within the wound bed. There is no necrotic tissue within the wound bed. The periwound skin appearance did not exhibit: Callus, Crepitus, Excoriation, Induration, Rash, Scarring, Dry/Scaly, Maceration, Atrophie Blanche, Cyanosis, Ecchymosis, Hemosiderin Staining, Mottled, Pallor, Rubor, Erythema. Periwound temperature was noted as No Abnormality. The periwound has tenderness on palpation. 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 At this time patient would like to proceed with a skin substitute. Grafix was placed in standard fashion. Recommended aggressive offloading and continuing with surgical shoe and peg assist. Follow-up in 1 week. Procedures Wound #3 Pre-procedure diagnosis of  Wound #3 is a Diabetic Wound/Ulcer of the Lower Extremity located on the [place]. A skin graft procedure using a bioengineered skin substitute/cellular or tissue based product was performed by Kalman Shan, DO with the following instrument(s): Forceps and  Scissors. Grafix prime was applied and secured with Steri-Strips and adaptic. 2 sq cm of product was utilized and 0 sq cm was wasted. Post Application, no dressing was applied. A Time Out was conducted at 09:25, prior to the start of the procedure. The procedure was tolerated well with a pain level of 0 throughout and a pain level of 0 following the procedure. Post procedure Diagnosis Wound #3: Same as Pre-Procedure . Plan Follow-up Appointments: Return Appointment in 1 week. - DR. Lounell Schumacher and Bobbi Rm # 8 Friday 03/07/23 @ 9:00 Return Appointment in 2 weeks. - Dr. Heber  and Allayne Butcher Rm # 9 Friday 03/14/23 @ 9:30 Other: - Call podiatry to follow with them. Anesthetic: (In clinic) Topical Lidocaine 5% applied to wound bed Cellular or Tissue Based Products: Cellular or Tissue Based Product Type: - apligraf and grafix 50% coinsurance 02/28/23-apply 1st Grafix Bathing/ Shower/ Hygiene: May shower with protection but do not get wound dressing(s) wet. Protect dressing(s) with water repellant cover (for example, large plastic bag) or a cast cover and may then take shower. Off-Loading: Other: - keep pressure off left lateral foot; use peg assist as insert in surgical shoe WOUND #3: - Foot Wound Laterality: Plantar, Left Cleanser: Wound Cleanser 1 x Per Day/15 Days Discharge Instructions: Cleanse the wound with wound cleanser prior to applying a clean dressing using [name] sponges, not tissue or cotton balls. Peri-Wound Care: Zinc Oxide Ointment 30g tube 1 x Per Day/15 Days Discharge Instructions: Apply Zinc Oxide to periwound with each dressing change Prim Dressing: Grafix 1 x Per Day/15 Days ary Discharge Instructions: apply directly to wound bed. Secondary Dressing: ABD Pad, 5x9 1 x Per Day/15 Days Discharge Instructions: Apply over primary dressing as directed. Secured With: [employer], 4.5x3.1 (in/yd) 1 x Per Day/15 Days Discharge Instructions: Secure with Kerlix as  directed. Secured With: 30M Medipore H Soft Cloth Surgical T ape, 4 x 10 (in/yd) 1 x Per Day/15 Days Discharge Instructions: Secure with tape as directed. 1. Grafix placed in standard fashion 2. Aggressive offloadingoosurgical shoe and peg assist 3. Follow-up in 1 week Electronic Signature(s) Signed: 02/28/2023 12:11:35 PM By: Kalman Shan DO Entered By: Kalman Shan on 02/28/2023 10:29:06 -------------------------------------------------------------------------------- HxROS Details Patient Name: Date of Service: [name] IN, [name] 02/28/2023 9:00 A M Medical Record Number: [id number] Patient Account Number: [id number] Date of Birth/Sex: Treating RN: [birth year] (52 y.o. M) Primary Care Provider: Asencion Noble Other Clinician: Referring Provider: Treating Provider/Extender: Madelynn Done in Treatment: 5 Information Obtained From Patient Eyes Medical History: Negative for: Cataracts; Glaucoma; Optic Neuritis [name], [name] ([id number]) [id number] Page 8 of 9 Ear/Nose/Mouth/Throat Medical History: Positive for: Chronic sinus problems/congestion Negative for: Middle ear problems Hematologic/Lymphatic Medical History: Negative for: Anemia; Hemophilia; Human Immunodeficiency Virus; Lymphedema; Sickle Cell Disease Respiratory Medical History: Negative for: Aspiration; Asthma; Chronic Obstructive Pulmonary Disease (COPD); Pneumothorax; Sleep Apnea; Tuberculosis Cardiovascular Medical History: Positive for: Congestive Heart Failure; Coronary Artery Disease; Hypertension; Myocardial Infarction - age 49 Negative for: Angina; Arrhythmia; Deep Vein Thrombosis; Hypotension; Peripheral Arterial Disease; Peripheral Venous Disease; Phlebitis; Vasculitis Past Medical History Notes: Ischemic Cardiomyopathy Gastrointestinal Medical History: Negative for: Cirrhosis ; Colitis; Crohns; Hepatitis A; Hepatitis B; Hepatitis  C Endocrine Medical History: Positive for: Type II Diabetes Time with  diabetes: ten years Treated with: Insulin Blood sugar tested every day: No Genitourinary Medical History: Negative for: End Stage Renal Disease Past Medical History Notes: Renal Insufficiency Immunological Medical History: Negative for: Lupus Erythematosus; Raynauds; Scleroderma Integumentary (Skin) Medical History: Negative for: History of Burn Musculoskeletal Medical History: Negative for: Gout; Rheumatoid Arthritis; Osteoarthritis; Osteomyelitis Neurologic Medical History: Positive for: Neuropathy Negative for: Dementia; Quadriplegia; Paraplegia; Seizure Disorder Oncologic Medical History: Negative for: Received Chemotherapy; Received Radiation Psychiatric Medical History: Negative for: Anorexia/bulimia; Confinement Anxiety HBO Extended History Items Ear/Nose/Mouth/Throat: Chronic sinus problems/congestion [name], [name] ([id number]) [id number] Page 9 of 9 Immunizations Pneumococcal Vaccine: Received Pneumococcal Vaccination: No Implantable Devices None Hospitalization / Surgery History Type of Hospitalization/Surgery Heart Cath in 2018 Family and Social History Cancer: No; Diabetes: No; Heart Disease: Yes - Mother,Father; Hereditary Spherocytosis: No; Hypertension: No; Kidney Disease: No; Lung Disease: No; Seizures: No; Stroke: No; Thyroid Problems: No; Tuberculosis: No; Never smoker; Marital Status - Single; Alcohol Use: Rarely; Drug Use: No History; Caffeine Use: Rarely; Financial Concerns: No; Food, Clothing or Shelter Needs: No; Support System Lacking: No; Transportation Concerns: No Electronic Signature(s) Signed: 02/28/2023 12:11:35 PM By: Kalman Shan DO Entered By: Kalman Shan on 02/28/2023 10:25:45 -------------------------------------------------------------------------------- SuperBill Details Patient Name: Date of Service: [name] IN, [name]  02/28/2023 Medical Record Number: [id number] Patient Account Number: [id number] Date of Birth/Sex: Treating RN: February 25, 1971 (52 y.o. [name] Primary Care Provider: Asencion Noble Other Clinician: Referring Provider: Treating Provider/Extender: Madelynn Done in Treatment: 5 Diagnosis Coding ICD-10 Codes Code Description [phone] 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.621 Type 2 diabetes mellitus with foot ulcer Z89.422 Acquired absence of other left toe(s) Facility Procedures : CPT4 Code: [id number] Description: Q4133- Grafix PL 16 mm disc (2 units) Modifier: Quantity: 2 : CPT4 Code: [id number] Description: [id number] - SKIN SUB GRAFT FACE/NK/HF/G ICD-10 Diagnosis Description L97.522 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.621 Type 2 diabetes mellitus with foot ulcer Modifier: Quantity: 1 Physician Procedures : CPT4 Code Description Modifier [id number] - WC PHYS SKIN SUB GRAFT FACE/NK/HF/G ICD-10 Diagnosis Description L97.522 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.621 Type 2 diabetes mellitus with foot ulcer Quantity: 1 Electronic Signature(s) Signed: 02/28/2023 12:11:35 PM By: Kalman Shan DO Entered By: Kalman Shan on 02/28/2023 10:29:19

## 2023-03-04 ENCOUNTER — Other Ambulatory Visit: Payer: Self-pay

## 2023-03-07 ENCOUNTER — Other Ambulatory Visit (HOSPITAL_COMMUNITY): Payer: Self-pay

## 2023-03-07 ENCOUNTER — Other Ambulatory Visit: Payer: Self-pay

## 2023-03-07 ENCOUNTER — Encounter (HOSPITAL_BASED_OUTPATIENT_CLINIC_OR_DEPARTMENT_OTHER): Payer: 59 | Admitting: Internal Medicine

## 2023-03-07 DIAGNOSIS — E11621 Type 2 diabetes mellitus with foot ulcer: Secondary | ICD-10-CM | POA: Diagnosis not present

## 2023-03-07 DIAGNOSIS — I130 Hypertensive heart and chronic kidney disease with heart failure and stage 1 through stage 4 chronic kidney disease, or unspecified chronic kidney disease: Secondary | ICD-10-CM | POA: Diagnosis not present

## 2023-03-07 DIAGNOSIS — E1140 Type 2 diabetes mellitus with diabetic neuropathy, unspecified: Secondary | ICD-10-CM | POA: Diagnosis not present

## 2023-03-07 DIAGNOSIS — L97522 Non-pressure chronic ulcer of other part of left foot with fat layer exposed: Secondary | ICD-10-CM | POA: Diagnosis not present

## 2023-03-07 DIAGNOSIS — Z955 Presence of coronary angioplasty implant and graft: Secondary | ICD-10-CM | POA: Diagnosis not present

## 2023-03-07 DIAGNOSIS — I252 Old myocardial infarction: Secondary | ICD-10-CM | POA: Diagnosis not present

## 2023-03-07 DIAGNOSIS — Z7984 Long term (current) use of oral hypoglycemic drugs: Secondary | ICD-10-CM | POA: Diagnosis not present

## 2023-03-07 DIAGNOSIS — N189 Chronic kidney disease, unspecified: Secondary | ICD-10-CM | POA: Diagnosis not present

## 2023-03-07 DIAGNOSIS — I5042 Chronic combined systolic (congestive) and diastolic (congestive) heart failure: Secondary | ICD-10-CM | POA: Diagnosis not present

## 2023-03-07 DIAGNOSIS — Z794 Long term (current) use of insulin: Secondary | ICD-10-CM | POA: Diagnosis not present

## 2023-03-07 DIAGNOSIS — Z89422 Acquired absence of other left toe(s): Secondary | ICD-10-CM | POA: Diagnosis not present

## 2023-03-07 DIAGNOSIS — I2510 Atherosclerotic heart disease of native coronary artery without angina pectoris: Secondary | ICD-10-CM | POA: Diagnosis not present

## 2023-03-07 MED ORDER — AMOXICILLIN-POT CLAVULANATE 875-125 MG PO TABS
1.0000 | ORAL_TABLET | Freq: Two times a day (BID) | ORAL | 0 refills | Status: AC
  Filled 2023-03-07: qty 20, 10d supply, fill #0

## 2023-03-08 NOTE — Progress Notes (Signed)
MORT, ESTOCK ([id number]) [id number] Page 1 of 9 Visit Report for 01/31/2023 Chief Complaint Document Details Patient Name: Date of Service: [name], [name] RO LD 01/31/2023 8:00 A M Medical Record Number: [id number] Patient Account Number: [id number] Date of Birth/Sex: Treating RN: 1971/03/03 (52 y.o. Male) Primary Care Provider: Asencion Noble Other Clinician: Referring Provider: Treating Provider/Extender: Madelynn Done [name] Treatment: 1 Information Obtained from: Patient Chief Complaint 01/15/2023; left foot wound Electronic Signature(s) Signed: 01/31/2023 11:56:15 AM By: Kalman Shan DO Entered By: Kalman Shan on 01/31/2023 08:49:47 -------------------------------------------------------------------------------- HPI Details Patient Name: Date of Service: [name], [name] 01/31/2023 8:00 A M Medical Record Number: [id number] Patient Account Number: [id number] Date of Birth/Sex: Treating RN: [birth year] (52 y.o. Male) Primary Care Provider: Asencion Noble Other Clinician: Referring Provider: Treating Provider/Extender: Madelynn Done [name] Treatment: 1 History of Present Illness HPI Description: ADMISSION 01/11/2020 This is a 52 year old man with uncontrolled type 2 diabetes with a recent hemoglobin A1c earlier this year of 13.4. He is on insulin and glipizide. He does not take his blood sugars at home. He does have a follow-up with primary care later this month I believe on January 27. He tells [place] that roughly a month ago he was walking with a shoe with a hole [name]. He took the shoe off and there was an open wound at roughly the left fourth met head. This has significant undermining and raised edges. He has not noticed any purulence he does not feel unwell. More recently he was taking skin off the bottom of his foot and has a superficial area on the left fifth met head. He has not been  offloading this. The patient was [name] on 12/20. They gave him Bactroban which she has been using on the wound and 10 days worth of doxycycline. No x-rays were done. He has not had vascular studies. He is also been using hydrogen peroxide. Past medical history type 2 diabetes uncontrolled, chronic systolic heart failure, coronary artery disease with a history of congestive heart failure with stents. Hypertension hyperlipidemia and chronic renal insufficiency ABI [name] this clinic was 1.14 on the left. 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 He is uncertain whether he would be able to work if we put him on some form of restriction 1/19; he is generally doing quite well. Using silver alginate on the wounds. Things actually look better. He has a forefoot offloading boot which she seems to be compliant about. He has support at work to stay off his foot is much as possible which is gratifying. Culture I did last week showed a few Enterococcus faecalis. I am going to put him on Augmentin. I talked about ordering an x-ray [name] my note last week but that does not seem to have happened. We will review reorder the x-ray this week. 1/26; x-ray reordered last week was negative for osteomyelitis. We are using silver alginate on the wound on the third and fifth met heads on the left. He is using a Darco forefoot off loader 2/2; the area on the fifth met head is closed. Third met head is still open with tunneling depth and thick callus. 2/9; the area on the fifth met head remains closed however the third met head again has a small open area on presentation with tunneling [name] depth and surrounded by thick callus. This looks like a pressure issue. We have been using silver alginate  2/16; the area of the fifth metatarsal head remains closed however the area over the third metatarsal head again is a small open area but with some depth. I do not think this is changed much since last week. He  is using Hydrofera Blue with forefoot off loader. He is not able to use a total contact cast on the left leg because he needs his left leg at work [employer]]. Fortunately the wound does not look infected. I changed him to endoform today 2/26; the area of the fifth metatarsal head remains closed. The area of the third metatarsal head has an even smaller opening this time. I used endoform on this this looks improved. He is offloading this is much as he can and a forefoot off loader on the left. He cannot have a total contact cast because of work responsibilities 3/5; the area on the fifth metatarsal head remains closed the area on the third metatarsal head is also closed on the left foot. 01/15/2023 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 He has been following for podiatry for several months for this issue. He is currently using wet-to-dry saline dressings. He currently denies signs of infection. Progresse note from 1/17; Patient presents with a 519-monthistory of nonhealing ulcer to the left foot secondary to diabetes and inability to offload well the FONAZAR, [id number] Page 2 of 9 area. He has had multiple debridements [name] the past to his feet bilaterally. He has had resection of the fourth left met head [name] the fifth toe Secondary to osteomyelitis. We discussed the importance of glycemic control for wound healing. Due to his blood glucose levels being elevated he is at high risk for infection and thus further amputation. He expressed understanding. He states [facility]. Unclear what happened. [facility].Furthermore we discussed the importance of aggressive offloading for his wound healing. This will be the most difficult part of the treatment plan for the patient to do. We discussed a total contact cast however he has declined that at this time. 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 He is currently using a surgical shoe with a peg assist. It does not fit well so we will give him a new one today. I do not think this is enough offloading. If he is not able to offload this area he will likely end up with a BKA. He is well aware of this. For now I recommended Medihoney and Hydrofera Blue for dressing changes. [name] 1/26; patient presents for follow-up. Last clinic visit I [name] Progress note above from that visit. Over the past week He has been using Medihoney and Hydrofera Blue to the wound bed. He has been using his surgical shoe with peg assist. He has no issues or complaints today. We  discussed doing the total contact cast and he was agreeable to having this placed at next clinic visit. 2/2; patient presents for follow-up. He has been using Vashe wet-to-dry dressings. Plan is for the total contact cast today. Electronic Signature(s) Signed: 01/31/2023 11:56:15 AM By: Kalman Shan DO Entered By: Kalman Shan on 01/31/2023 08:51:32 -------------------------------------------------------------------------------- Physical Exam Details Patient Name: Date of Service: [name], [name] 01/31/2023 8:00 A M Medical Record Number: [id number] Patient Account Number: [id number] Date of Birth/Sex: Treating  RN: 15-May-1971 (52 y.o. Male) Primary Care Provider: Asencion Noble Other Clinician: Referring Provider: Treating Provider/Extender: Madelynn Done [name] Treatment: 1 Constitutional respirations regular, non-labored and within target range for patient.. Cardiovascular 2+ dorsalis pedis/posterior tibialis pulses. Psychiatric pleasant and cooperative. Notes Left foot: T the lateral aspect, fifth met head there is an open wound with granulation tissue. No signs of infection including increased warmth, erythema or o purulent drainage. Electronic Signature(s) Signed: 01/31/2023 11:56:15 AM By: Kalman Shan DO Entered By: Kalman Shan on 01/31/2023 08:52:45 -------------------------------------------------------------------------------- Physician Orders Details Patient Name: Date of Service: [name], [name] 01/31/2023 8:00 A M Medical Record Number: [id number] Patient Account Number: [id number] Date of Birth/Sex: Treating RN: 05/19/1971 (51 y.o. Male) Rhae Hammock Primary Care Provider: Asencion Noble Other Clinician: Referring Provider: Treating Provider/Extender: Madelynn Done [name] Treatment: 1 Verbal / Phone Orders: No Diagnosis Coding Follow-up Appointments ppointment [name] 1 week. - Monday 02/03/23 @ 1015 Rm # 7 (already has appt.) Return A w/ Dr. Heber [place] next Friday 02/07/23 @ 0845 Rm # 7 (already has appt.) ***TCC*** ppointment [name] 2 weeks. - w/ Dr. Heber [place] and Allayne Butcher Rm # 9 (go ahead and schedule appt.) Return A ***TCC*** Anesthetic ([name]) Topical Lidocaine 5% applied to wound bed MADSEN, FROHN ([id number]) [id number] Page 3 of 9 Bathing/ Shower/ Hygiene May shower with protection but do not get wound dressing(s) wet. Protect dressing(s) with water repellant cover (for example, large plastic bag) or a cast cover and may then take shower. Off-Loading Other: - keep pressure off  left lateral foot Wound Treatment Wound #3 - Foot Wound Laterality: Plantar, Left Cleanser: Wound Cleanser 1 x Per Day/15 Days Discharge Instructions: Cleanse the wound with wound cleanser prior to applying a clean dressing using gauze sponges, not tissue or cotton balls. Topical: Gentamicin 1 x Per Day/15 Days Discharge Instructions: As directed by physician Topical: Mupirocin Ointment 1 x Per Day/15 Days Discharge Instructions: Apply Mupirocin (Bactroban) as instructed Prim Dressing: Hydrofera Blue Ready Transfer Foam, 4x5 ([name]/[name]) 1 x Per Day/15 Days ary Discharge Instructions: Apply to wound bed as instructed Secondary Dressing: ABD Pad, 5x9 1 x Per Day/15 Days Discharge Instructions: Apply over primary dressing as directed. Secured With: [employer], 4.5x3.1 ([name]/yd) 1 x Per Day/15 Days Discharge Instructions: Secure with Kerlix as directed. Secured With: 70M Medipore H Soft Cloth Surgical T ape, 4 x 10 ([name]/yd) 1 x Per Day/15 Days Discharge Instructions: Secure with tape as directed. Electronic Signature(s) Signed: 01/31/2023 11:56:15 AM By: Kalman Shan DO Entered By: Kalman Shan on 01/31/2023 08:52:55 -------------------------------------------------------------------------------- Problem List Details Patient Name: Date of Service: [name], [name] 01/31/2023 8:00 A M Medical Record Number: [id number] Patient Account Number: [id number] Date of Birth/Sex: Treating RN: May 10, 1971 (52 y.o. Male) Primary Care Provider: Asencion Noble Other Clinician: Referring Provider: Treating Provider/Extender: Madelynn Done [name] Treatment: 1 Active Problems ICD-10 Encounter Code Description Active Date MDM Diagnosis [phone] Non-pressure chronic ulcer of  other part of left foot with fat layer exposed 01/24/2023 No Yes E11.621 Type 2 diabetes mellitus with foot ulcer 01/24/2023 No Yes Z89.422 Acquired absence of other left toe(s) 01/24/2023 No  Yes Inactive Problems Resolved Problems Electronic Signature(s) Signed: 01/31/2023 11:56:15 AM By: Kalman Shan DO Entered By: Kalman Shan on 01/31/2023 08:49:32 [name] ([id number] Page 4 of 9 -------------------------------------------------------------------------------- Progress Note Details Patient Name: Date of Service: Glenn Gauze [place], [name] RO LD 01/31/2023 8:00 A M Medical Record Number: [id number] Patient Account Number: [id number] Date of Birth/Sex: Treating RN: Nov 23, 1971 (52 y.o. Male) Primary Care Provider: Asencion Noble Other Clinician: Referring Provider: Treating Provider/Extender: Madelynn Done [name] Treatment: 1 Subjective Chief Complaint Information obtained from Patient 01/15/2023; left foot wound History of Present Illness (HPI) ADMISSION 01/11/2020 This is a 52 year old man with uncontrolled type 2 diabetes with a recent hemoglobin A1c earlier this year of 13.4. He is on insulin and glipizide. He does not take his blood sugars at home. He does have a follow-up with primary care later this month I believe on January 27. He tells [place] that roughly a month ago he was walking with a shoe with a hole [name]. He took the shoe off and there was an open wound at roughly the left fourth met head. This has significant undermining and raised edges. He has not noticed any purulence he does not feel unwell. More recently he was taking skin off the bottom of his foot and has a superficial area on the left fifth met head. He has not been offloading this. The patient was [name] on 12/20. They gave him Bactroban which she has been using on the wound and 10 days worth of doxycycline. No x-rays were done. He has not had vascular studies. He is also been using hydrogen peroxide. Past medical history type 2 diabetes uncontrolled, chronic systolic heart failure, coronary artery disease with a history of  congestive heart failure with stents. Hypertension hyperlipidemia and chronic renal insufficiency ABI [name] this clinic was 1.14 on the left. 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 He is uncertain whether he would be able to work if we put him on some form of restriction 1/19; he is generally doing quite well. Using silver alginate on the wounds. Things actually look better. He has a forefoot offloading boot which she seems to be compliant about. He has support at work to stay off his foot is much as possible which is gratifying. Culture I did last week showed a few Enterococcus faecalis. I am going to put him on Augmentin. I talked about ordering an x-ray [name] my note last week but that does not seem to have happened. We will review reorder the x-ray this week. 1/26; x-ray reordered last week was negative for osteomyelitis. We are using silver alginate on the wound on the third and fifth met heads on the left. He is using a Darco forefoot off loader 2/2; the area on the fifth met head is closed. Third met head is still open with tunneling depth and thick callus. 2/9; the area on the fifth met head remains closed however the third met head again has a small open area on presentation with tunneling [name] depth and surrounded by thick callus. This looks like a pressure issue. We have been using silver alginate 2/16; the area of the fifth metatarsal head remains closed however the area over the third metatarsal head again is  a small open area but with some depth. I do not think this is changed much since last week. He is using Hydrofera Blue with forefoot off loader. He is not able to use a total contact cast on the left leg because he needs his left leg at work [employer]]. Fortunately the wound does not look infected. I changed him to endoform today 2/26; the area of the fifth metatarsal head remains closed. The area of the third metatarsal head has an even smaller  opening this time. I used endoform on this this looks improved. He is offloading this is much as he can and a forefoot off loader on the left. He cannot have a total contact cast because of work responsibilities 3/5; the area on the fifth metatarsal head remains closed the area on the third metatarsal head is also closed on the left foot. 01/15/2023 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 He has been following for podiatry for several months for this issue. He is currently using wet-to-dry saline dressings. He currently denies signs of infection. Progresse note from 1/17; Patient presents with a 550-monthistory of nonhealing ulcer to the left foot secondary to diabetes and inability to offload well the area. He has had multiple debridements [name] the past to his feet bilaterally. He has had resection of the fourth left met head [name] the fifth toe Secondary to osteomyelitis. We discussed the importance of glycemic control for wound healing. Due to his blood glucose levels being elevated he is at high risk for infection and thus further amputation. He expressed understanding. He states [facility]. Unclear what happened. [facility].Furthermore we discussed the importance of aggressive offloading for his wound healing. This will be the most difficult part of the treatment plan for the patient to do. We discussed a total contact cast however he has declined that at this time. 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 He is currently using a surgical shoe with a peg assist. It does not fit well so we will give him a new one today. I do not think this is enough offloading. If he is not able to offload this area he will likely end up with a BKA. He is well aware of this. For now I recommended Medihoney and Hydrofera Blue for dressing changes. [name]. 1/26; patient presents for follow-up. Last clinic visit [name] Progress note above from that visit. Over the past week He has been using Medihoney and Hydrofera Blue to the wound bed. He has been using his surgical shoe with peg assist. He has no issues or complaints today. We discussed doing the total contact cast and he was agreeable to having this placed at next clinic visit. 2/2; patient presents for follow-up. He has been using Vashe  wet-to-dry dressings. Plan is for the total contact cast today. Patient History Information obtained from Patient. Family History Heart Disease - Mother,Father, No family history of Cancer, Diabetes, Hereditary Spherocytosis, Hypertension, Kidney Disease, Lung Disease, Seizures, Stroke, Thyroid Problems, Tuberculosis. Social History RODRICO, ASPLUND ([id number]) [id number] Page 5 of 9 Never smoker, Marital Status - Single, Alcohol Use - Rarely, Drug Use - No History, Caffeine Use - Rarely. Medical History Eyes Denies history of Cataracts, Glaucoma, Optic Neuritis Ear/Nose/Mouth/Throat Patient has history of Chronic sinus problems/congestion Denies history of Middle ear problems Hematologic/Lymphatic Denies history of Anemia, Hemophilia, Human Immunodeficiency Virus, Lymphedema, Sickle Cell Disease Respiratory Denies history of Aspiration, Asthma, Chronic Obstructive  Pulmonary Disease (COPD), Pneumothorax, Sleep Apnea, Tuberculosis Cardiovascular Patient has history of Congestive Heart Failure, Coronary Artery Disease, Hypertension, Myocardial Infarction - age 40 Denies history of Angina, Arrhythmia, Deep Vein Thrombosis, Hypotension, Peripheral Arterial Disease, Peripheral Venous Disease, Phlebitis, Vasculitis Gastrointestinal Denies history of Cirrhosis , Colitis, Crohnoos, Hepatitis A, Hepatitis B, Hepatitis C Endocrine Patient has history of Type II Diabetes Genitourinary Denies history of End Stage Renal Disease Immunological Denies history of Lupus Erythematosus, Raynaudoos, Scleroderma Integumentary (Skin) Denies history of History of Burn Musculoskeletal Denies history of Gout, Rheumatoid Arthritis, Osteoarthritis, Osteomyelitis Neurologic Patient has history of Neuropathy Denies history of Dementia, Quadriplegia, Paraplegia, Seizure Disorder Oncologic Denies history of Received Chemotherapy, Received Radiation Psychiatric Denies history of Anorexia/bulimia, Confinement Anxiety Hospitalization/Surgery History - Heart Cath [name] 2018. Medical A Surgical History Notes nd Cardiovascular Ischemic Cardiomyopathy Genitourinary Renal Insufficiency Objective Constitutional respirations regular, non-labored and within target range for patient.. Vitals Time Taken: 7:53 AM, Height: 78 [name], Weight: 275 lbs, BMI: 31.8, Temperature: 97.9 F, Pulse: 87 bpm, Respiratory Rate: 20 breaths/min, Blood Pressure: 129/88 mmHg, Capillary Blood Glucose: 171 mg/dl. Cardiovascular 2+ dorsalis pedis/posterior tibialis pulses. Psychiatric pleasant and cooperative. General Notes: Left foot: T the lateral aspect, fifth met head there is an open wound with granulation tissue. No signs of infection including increased warmth, o erythema or purulent drainage. Integumentary (Hair, Skin) Wound #3 status is Open. Original cause of wound was Gradually Appeared. The  date acquired was: 10/01/2022. The wound has been [name] treatment 1 weeks. The wound is located on the [place]. The wound measures 2.2cm length x 2.1cm width x 0.2cm depth; 3.629cm^2 area and 0.726cm^3 volume. There is Fat Layer (Subcutaneous Tissue) exposed. There is no tunneling or undermining noted. There is a medium amount of serosanguineous drainage noted. The wound margin is distinct with the outline attached to the wound base. There is large (67-100%) red, pink granulation within the wound bed. There is no necrotic tissue within the wound bed. The periwound skin appearance exhibited: Callus, Dry/Scaly. The periwound skin appearance did not exhibit: Crepitus, Excoriation, Induration, Rash, Scarring, Maceration, Atrophie Blanche, Cyanosis, Ecchymosis, Hemosiderin Staining, Mottled, Pallor, Rubor, Erythema. Assessment Active Problems ICD-10 Non-pressure chronic ulcer of other part of left foot with fat layer exposed Type 2 diabetes mellitus with foot ulcer [name], [name] ([id number]) [id number] Page 6 of 9 Acquired absence of other left toe(s) Patient's wound is stable. No signs of infection. I recommended Hydrofera Blue along with antibiotic ointment under the total contact cast. I will see him early next week for his obligatory cast change. Procedures Wound #3 Pre-procedure diagnosis of Wound #3 is a Diabetic Wound/Ulcer of the Lower Extremity located on the Left,Plantar Foot . There was a T Programmer, multimedia otal Procedure by Kalman Shan, DO. Post procedure Diagnosis Wound #3: Same as Pre-Procedure Plan Follow-up Appointments: Return Appointment  [name] 1 week. - Monday 02/03/23 @ 1015 Rm # 7 (already has appt.) w/ Dr. Heber [place]ury next Friday 02/07/23 @ 0845 Rm # 7 (already has appt.) ***TCC*** Return Appointment [name] 2 weeks. - w/ Dr. Heber [place] and Allayne Butcher Rm # 9 (go ahead and schedule appt.) ***TCC*** Anesthetic: ([name] clinic) Topical Lidocaine 5% applied to  wound bed Bathing/ Shower/ Hygiene: May shower with protection but do not get wound dressing(s) wet. Protect dressing(s) with water repellant cover (for example, large plastic bag) or a cast cover and may then take shower. Off-Loading: Other: - keep pressure off left lateral foot WOUND #3: - Foot Wound Laterality: Plantar, Left Cleanser: Wound Cleanser 1 x Per Day/15 Days Discharge Instructions: Cleanse the wound with wound cleanser prior to applying a clean dressing using gauze sponges, not tissue or cotton balls. Topical: Gentamicin 1 x Per Day/15 Days Discharge Instructions: As directed by physician Topical: Mupirocin Ointment 1 x Per Day/15 Days Discharge Instructions: Apply Mupirocin (Bactroban) as instructed Prim Dressing: Hydrofera Blue Ready Transfer Foam, 4x5 ([name]/[name]) 1 x Per Day/15 Days ary Discharge Instructions: Apply to wound bed as instructed Secondary Dressing: ABD Pad, 5x9 1 x Per Day/15 Days Discharge Instructions: Apply over primary dressing as directed. Secured With: [employer], 4.5x3.1 ([name]/yd) 1 x Per Day/15 Days Discharge Instructions: Secure with Kerlix as directed. Secured With: 23M Medipore H Soft Cloth Surgical T ape, 4 x 10 ([name]/yd) 1 x Per Day/15 Days Discharge Instructions: Secure with tape as directed. 1. Hydrofera Blue and antibiotic ointment 2. T contact cast placed [name] standard fashion otal 3. Follow-up next week Electronic Signature(s) Signed: 01/31/2023 11:56:15 AM By: Kalman Shan DO Entered By: Kalman Shan on 01/31/2023 08:54:56 -------------------------------------------------------------------------------- HxROS Details Patient Name: Date of Service: [name], [name] 01/31/2023 8:00 A M Medical Record Number: [id number] Patient Account Number: [id number] Date of Birth/Sex: Treating RN: 14-Jun-1971 (52 y.o. Male) Primary Care Provider: Asencion Noble Other Clinician: Referring Provider: Treating Provider/Extender: Madelynn Done [name] Treatment: 1 Information Obtained From Patient Eyes Medical History: Negative for: Cataracts; Glaucoma; Optic Neuritis JASAAN, THIGPEN ([id number]) [id number] Page 7 of 9 Ear/Nose/Mouth/Throat Medical History: Positive for: Chronic sinus problems/congestion Negative for: Middle ear problems Hematologic/Lymphatic Medical History: Negative for: Anemia; Hemophilia; Human Immunodeficiency Virus; Lymphedema; Sickle Cell Disease Respiratory Medical History: Negative for: Aspiration; Asthma; Chronic Obstructive Pulmonary Disease (COPD); Pneumothorax; Sleep Apnea; Tuberculosis Cardiovascular Medical History: Positive for: Congestive Heart Failure; Coronary Artery Disease; Hypertension; Myocardial Infarction - age 45 Negative for: Angina; Arrhythmia; Deep Vein Thrombosis; Hypotension; Peripheral Arterial Disease; Peripheral Venous Disease; Phlebitis; Vasculitis Past Medical History Notes: Ischemic Cardiomyopathy Gastrointestinal Medical History: Negative for: Cirrhosis ; Colitis; Crohns; Hepatitis A; Hepatitis B; Hepatitis C Endocrine Medical History: Positive for: Type II Diabetes Time with diabetes: ten years Treated with: Insulin Blood sugar tested every day: No Genitourinary Medical History: Negative for: End Stage Renal Disease Past Medical History Notes: Renal Insufficiency Immunological Medical History: Negative for: Lupus Erythematosus; Raynauds; Scleroderma Integumentary (Skin) Medical History: Negative for: History of Burn Musculoskeletal Medical History: Negative for: Gout; Rheumatoid Arthritis; Osteoarthritis; Osteomyelitis Neurologic Medical History: Positive for: Neuropathy Negative for: Dementia; Quadriplegia; Paraplegia; Seizure Disorder Oncologic Medical History: Negative for: Received Chemotherapy; Received Radiation Psychiatric Medical History: Negative for: Anorexia/bulimia; Confinement  Anxiety HBO Extended History Items Ear/Nose/Mouth/Throat: Chronic sinus problems/congestion ALLYN, [name] ([id number]) [id number] Page 8 of 9 Immunizations Pneumococcal Vaccine: Received Pneumococcal Vaccination: No Implantable Devices None Hospitalization / Surgery History Type of Hospitalization/Surgery Heart Cath [name] 2018 Family and Social  History Cancer: No; Diabetes: No; Heart Disease: Yes - Mother,Father; Hereditary Spherocytosis: No; Hypertension: No; Kidney Disease: No; Lung Disease: No; Seizures: No; Stroke: No; Thyroid Problems: No; Tuberculosis: No; Never smoker; Marital Status - Single; Alcohol Use: Rarely; Drug Use: No History; Caffeine Use: Rarely; Financial Concerns: No; Food, Clothing or Shelter Needs: No; Support System Lacking: No; Transportation Concerns: No Electronic Signature(s) Signed: 01/31/2023 11:56:15 AM By: Kalman Shan DO Entered By: Kalman Shan on 01/31/2023 08:51:49 -------------------------------------------------------------------------------- Total Contact Cast Details Patient Name: Date of Service: [name], [name] 01/31/2023 8:00 A M Medical Record Number: [id number] Patient Account Number: [id number] Date of Birth/Sex: Treating RN: Jul 18, 1971 (51 y.o. Male) Rhae Hammock Primary Care Provider: Asencion Noble Other Clinician: Referring Provider: Treating Provider/Extender: Madelynn Done [name] Treatment: 1 T Contact Cast Applied for Wound Assessment: otal Wound #3 Left,Plantar Foot Performed By: Physician Kalman Shan, DO Post Procedure Diagnosis Same as Pre-procedure Electronic Signature(s) Signed: 01/31/2023 11:56:15 AM By: Kalman Shan DO Signed: 03/06/2023 4:33:55 PM By: Rhae Hammock RN Entered By: Rhae Hammock on 01/31/2023 08:43:10 -------------------------------------------------------------------------------- SuperBill Details Patient Name: Date of  Service: [name], [name] 01/31/2023 Medical Record Number: [id number] Patient Account Number: [id number] Date of Birth/Sex: Treating RN: July 14, 1971 (51 y.o. 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) Facility Procedures : [facility] Code: [id number] , Cash ([id number]) Description: [id number] - APPLY TOTAL CONTACT LEG CAST ICD-10 Diagnosis Description L97.522 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.621 Type 2 diabetes mellitus with foot ulcer [phone] Modifier: [id number] Quantity: 1 [id number] Page 9 of 9 Physician Procedures : CPT4 Code Description Modifier [id number] - WC PHYS APPLY TOTAL CONTACT CAST ICD-10 Diagnosis Description L97.522 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.621 Type 2 diabetes mellitus with foot ulcer Quantity: 1 Electronic Signature(s) Signed: 01/31/2023 11:56:15 AM By: Kalman Shan DO Entered By: Kalman Shan on 01/31/2023 08:55:04

## 2023-03-08 NOTE — Progress Notes (Signed)
Glenn, [name] ([id number]) [id number] Page 1 of 10 Visit Report for 03/07/2023 Chief Complaint Document Details Patient Name: Date of Service: [name], [place] RO LD 03/07/2023 9:00 A M Medical Record Number: [id number] Patient Account Number: [id number] Date of Birth/Sex: Treating RN: [birth year] (52 y.o. Male) Primary Care Provider: Asencion Noble Other Clinician: Referring Provider: Treating Provider/Extender: Madelynn Done in Treatment: 6 Information Obtained from: Patient Chief Complaint 01/15/2023; left foot wound Electronic Signature(s) Signed: 03/07/2023 11:02:34 AM By: Kalman Shan DO Entered By: Kalman Shan on 03/07/2023 09:58:56 -------------------------------------------------------------------------------- Debridement Details Patient Name: Date of Service: [name], [name] 03/07/2023 9:00 A M Medical Record Number: [id number] Patient Account Number: [id number] Date of Birth/Sex: Treating RN: 04/22/1971 (51 y.o. Male) Deon Pilling Primary Care Provider: Asencion Noble Other Clinician: Referring Provider: Treating Provider/Extender: Madelynn Done in Treatment: 6 Debridement Performed for Assessment: Wound #3 Left,Plantar Foot Performed By: Physician Kalman Shan, DO Debridement Type: Debridement Severity of Tissue Pre Debridement: Fat layer exposed Level of Consciousness (Pre-procedure): Awake and Alert Pre-procedure Verification/Time Out Yes - 09:00 Taken: Start Time: 09:01 Pain Control: Lidocaine 4% T opical Solution T Area Debrided (L x W): otal 1.5 (cm) x 3 (cm) = 4.5 (cm) Tissue and other material debrided: Viable, Non-Viable, Callus, Slough, Subcutaneous, Skin: Dermis , Skin: Epidermis, Slough Level: Skin/Subcutaneous Tissue Debridement Description: Excisional Instrument: Curette Bleeding: Minimum Hemostasis Achieved: Pressure End Time: 09:11 Procedural Pain:  0 Post Procedural Pain: 0 Response to Treatment: Procedure was tolerated well Level of Consciousness (Post- Awake and Alert procedure): Post Debridement Measurements of Total Wound Length: (cm) 1.5 Width: (cm) 3 Depth: (cm) 0.1 Volume: (cm) 0.353 Character of Wound/Ulcer Post Debridement: Stable Severity of Tissue Post Debridement: Fat layer exposed Post Procedure Diagnosis Same as Pre-procedure Electronic Signature(s) Signed: 03/07/2023 11:02:34 AM By: Kalman Shan DO Signed: 03/07/2023 3:03:33 PM By: Deon Pilling RN, BSN Entered By: Deon Pilling on 03/07/2023 09:11:59 [name] ([id number]) [id number] Page 2 of 10 -------------------------------------------------------------------------------- HPI Details Patient Name: Date of Service: [name] [place], [place] RO LD 03/07/2023 9:00 A M Medical Record Number: [id number] Patient Account Number: [id number] Date of Birth/Sex: Treating RN: 01-06-1971 (52 y.o. Male) Primary Care Provider: Asencion Noble Other Clinician: Referring Provider: Treating Provider/Extender: Madelynn Done in Treatment: 6 History of Present Illness HPI Description: ADMISSION 01/11/2020 This is a 52 year old man with uncontrolled type 2 diabetes with a recent hemoglobin A1c earlier this year of 13.4. He is on insulin and glipizide. He does not take his blood sugars at home. He does have a follow-up with primary care later this month I believe on January 27. He tells [place] that roughly a month ago he was walking with a shoe with a hole in his foot. He took the shoe off and there was an open wound at roughly the left fourth met head. This has significant undermining and raised edges. He has not noticed any purulence he does not feel unwell. More recently he was taking skin off the bottom of his foot and has a superficial area on the left fifth met head. He has not been offloading this. The patient was in the ER on  12/20. They gave him Bactroban which she has been using on the wound and 10 days worth of doxycycline. No x-rays were done. He has not had vascular studies. He is also been using hydrogen peroxide. Past medical history type 2 diabetes uncontrolled, chronic systolic heart failure, coronary artery disease with a history  of congestive heart failure with stents. Hypertension hyperlipidemia and chronic renal insufficiency ABI in this clinic was 1.14 on the left. 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 He is uncertain whether he would be able to work if we put him on some form of restriction 1/19; he is generally doing quite well. Using silver alginate on the wounds. Things actually look better. He has a forefoot offloading boot which she seems to be compliant about. He has support at work to stay off his foot is much as possible which is gratifying. Culture I did last week showed a few Enterococcus faecalis. I am going to put him on Augmentin. I talked about ordering an x-ray in my note last week but that does not seem to have happened. We will review reorder the x-ray this week. 1/26; x-ray reordered last week was negative for osteomyelitis. We are using silver alginate on the wound on the third and fifth met heads on the left. He is using a Darco forefoot off loader 2/2; the area on the fifth met head is closed. Third met head is still open with tunneling depth and thick callus. 2/9; the area on the fifth met head remains closed however the third met head again has a small open area on presentation with tunneling in depth and surrounded by thick callus. This looks like a pressure issue. We have been using silver alginate 2/16; the area of the fifth metatarsal head remains closed however the area over the third metatarsal head again is a small open area but with some depth. I do not think this is changed much since last week. He is using Hydrofera Blue with forefoot off  loader. He is not able to use a total contact cast on the left leg because he needs his left leg at work [employer]]. Fortunately the wound does not look infected. I changed him to endoform today 2/26; the area of the fifth metatarsal head remains closed. The area of the third metatarsal head has an even smaller opening this time. I used endoform on this this looks improved. He is offloading this is much as he can and a forefoot off loader on the left. He cannot have a total contact cast because of work responsibilities 3/5; the area on the fifth metatarsal head remains closed the area on the third metatarsal head is also closed on the left foot. 01/15/2023 Mr. 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 He has been following for podiatry for several months for this issue. He is currently using wet-to-dry saline dressings. He currently denies signs of infection. Progresse note from 1/17; Patient presents with a 55-monthistory of nonhealing ulcer to the left foot secondary to diabetes and inability to offload well the area. He has had multiple debridements in the past to his feet bilaterally. He has had resection of the fourth left met head in the fifth toe Secondary to osteomyelitis. We discussed the importance of glycemic control for wound healing. Due to his blood glucose levels being elevated he is at high risk for infection and thus further amputation. He expressed understanding. He states he is supposed to be referred to  an endocrinologist at [facility] however the referral fell through. Unclear what happened. Offered a referral to endocrinology at  [facility]. Patient was agreeable.Furthermore we discussed the importance of aggressive offloading for his wound healing. This will be the most difficult part of the treatment plan for the patient to do. We discussed a total contact cast however he has declined that at this time. 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 He is currently using a surgical shoe with a peg assist. It does not fit well so we will give him a new one today. I do not think this is enough offloading. If he is not able to offload this area he will likely end up with a BKA. He is well aware of this. For now I recommended Medihoney and Hydrofera Blue for dressing changes. He will follow-up in our [place] office Since this is closer for him. 1/26; patient presents for follow-up. Last clinic visit I had seen him in [place] and we transferred him to [place] since this is a closer location for him. Progress note above from that visit. Over the past week He has been using Medihoney and Hydrofera Blue to the wound bed. He has been using his surgical shoe with peg assist. He has no issues or complaints today. We discussed doing the total contact cast and he was agreeable to having this placed at next clinic visit. 2/2; patient presents for follow-up. He has been using Vashe wet-to-dry dressings. Plan is for the total contact cast today. 2/5; patient presents for follow-up. The cast was placed 3 days ago. He tolerated this well however had a lot of drainage. He currently denies signs of infection. 2/9; patient presents for follow-up. At last clinic visit we held off on replacing the cast due to drainage. He has been using Vashe wet-to-dry dressings and he is currently taking the oral antibiotics prescribed without issues. He declines a total contact cast today. We ran insurance verification for skin substitute and due to cost patient declines having this  placed. 2/16; patient presents for follow-up. He has decided not to follow-up with podiatry. He continues to decline the total contact cast. He has been using Vashe wet- to-dry dressings. He has not been wearing his surgical shoe with peg assist. He currently denies signs of infection. 3/1; patient presents for follow-up. He has been doing Vashe wet-to-dry dressings. He has a surgical shoe with peg assist. We discussed potentially doing a skin substitute for which she has been approved for by insurance. He knows the out-of-pocket cost of this and would still like to proceed with having this placed today. He denies the current total contact cast. [name], [name] ([id number]) [id number] Page 3 of 10 3/8; patient presents for follow-up. He states that the skin substitute came off after a few days and he has been using Vashe wet-to-dry dressings. He has mild odor to the wound bed on exam. Electronic Signature(s) Signed: 03/07/2023 11:02:34 AM By: Kalman Shan DO Entered By: Kalman Shan on 03/07/2023 09:59:38 -------------------------------------------------------------------------------- Physical Exam Details Patient Name: Date of Service: [name], [name] 03/07/2023 9:00 A M Medical Record Number: [id number] Patient Account Number: [id number] Date of Birth/Sex: Treating RN: [birth year] (52 y.o. Male) Primary Care Provider: Asencion Noble Other Clinician: Referring Provider: Treating Provider/Extender: Madelynn Done in Treatment: 6 Constitutional respirations regular, non-labored and within target range for patient.. Cardiovascular 2+  dorsalis pedis/posterior tibialis pulses. Psychiatric pleasant and cooperative. Notes Left foot: T the lateral aspect, fifth met head there is an open wound with granulation tissue And nonviable tissue. No purulent drainage. Mild odor on exam. o No obvious increase in warmth or erythema. Overall  tissue does not appear healthy. Electronic Signature(s) Signed: 03/07/2023 11:02:34 AM By: Kalman Shan DO Entered By: Kalman Shan on 03/07/2023 10:02:53 -------------------------------------------------------------------------------- Physician Orders Details Patient Name: Date of Service: [name] 03/07/2023 9:00 A M Medical Record Number: [id number] Patient Account Number: [id number] Date of Birth/Sex: Treating RN: 14-Jun-1971 (51 y.o. Male) Deon Pilling Primary Care Provider: Asencion Noble Other Clinician: Referring Provider: Treating Provider/Extender: Madelynn Done in Treatment: 6 Verbal / Phone Orders: No Diagnosis Coding ICD-10 Coding Code Description [phone] 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.621 Type 2 diabetes mellitus with foot ulcer Z89.422 Acquired absence of other left toe(s) Follow-up Appointments ppointment in 1 week. - Dr. Heber Kay and Allayne Butcher Rm # 9 Friday 03/14/23 @ 9:30 Return A ppointment in 2 weeks. - Dr. Heber [place] Friday room 8 03/21/2023 0845 Return A Other: - Pick up oral antibiotics. Anesthetic (In clinic) Topical Lidocaine 5% applied to wound bed Cellular or Tissue Based Products Cellular or Tissue Based Product Type: - apligraf and grafix 50% coinsurance 02/28/23-apply 1st Grafix 03/07/2023 HOLD THIS WEEK. Bathing/ Shower/ Hygiene May shower with protection but do not get wound dressing(s) wet. Protect dressing(s) with water repellant cover (for example, large plastic bag) or a cast cover and may then take shower. ALBI, MOYO ([id number]) [id number] Page 4 of 10 Off-Loading Other: - keep pressure off left lateral foot; use peg assist as insert in surgical shoe Additional Orders / Instructions Other: - ice, elevation and ace wrap to foot and ankle to aid in the right medial foot area. Wound Treatment Wound #3 - Foot Wound Laterality:  Plantar, Left Cleanser: Wound Cleanser 1 x Per [id number] Days Discharge Instructions: Cleanse the wound with wound cleanser prior to applying a clean dressing using [name] sponges, not tissue or cotton balls. Prim Dressing: Vashe 1 x Per [id number] Days ary Discharge Instructions: wet to dry [name]. Secondary Dressing: Optifoam Non-Adhesive Dressing, 4x4 in 1 x Per Day/15 Days Discharge Instructions: Apply over primary dressing FOAM DONUT. Secured With: [employer], 4.5x3.1 (in/yd) 1 x Per Day/15 Days Discharge Instructions: Secure with Kerlix as directed. Secured With: 80M Medipore H Soft Cloth Surgical T ape, 4 x 10 (in/yd) 1 x Per Day/15 Days Discharge Instructions: Secure with tape as directed. Patient Medications llergies: Jardiance A Notifications Medication Indication Start End 03/07/2023 amoxicillin-pot clavulanate DOSE 1 - oral 875 mg-125 mg tablet - 1 tablet oral twice a day x 10 days Electronic Signature(s) Signed: 03/07/2023 11:02:34 AM By: Kalman Shan DO Entered By: Kalman Shan on 03/07/2023 10:03:02 Prescription 03/07/2023 -------------------------------------------------------------------------------- Cyndie Chime DO Patient Name: Provider: [birth year] [id number] Date of Birth: NPI#: Male [id number] Sex: DEA #: [phone] Phone #: License #: [place] Patient Address: [address], [place] [address] [address], [place] [address] [phone] Allergies Jardiance Medication Medication: Route: Strength: Form: amoxicillin 875 mg-potassium clavulanate 125 oral 875 mg-125 mg tablet mg tablet Class: PENICILLIN ANTIBIOTICS Dose: Frequency / Time: Indication: 1 1 tablet oral twice a day x 10 days Number of Refills: Number of Units: 0 Generic Substitution: Start Date: End Date: One Time Use: Substitution Permitted  03/07/2023 No Note to Pharmacy: Hand  Signature: Date(s): Electronic Signature(s) REXIE, ZOLL ([id number]) [id number] Page 5 of 10 Signed: 03/07/2023 11:02:34 AM By: Kalman Shan DO Entered By: Kalman Shan on 03/07/2023 10:03:02 -------------------------------------------------------------------------------- Problem List Details Patient Name: Date of Service: [name], [name] 03/07/2023 9:00 A M Medical Record Number: [id number] Patient Account Number: [id number] Date of Birth/Sex: Treating RN: [birth year] (51 y.o. Male) Deon Pilling Primary Care Provider: Asencion Noble Other Clinician: Referring Provider: Treating Provider/Extender: Madelynn Done in Treatment: 6 Active Problems ICD-10 Encounter Code Description Active Date MDM Diagnosis [phone] Non-pressure chronic ulcer of other part of left foot with fat layer exposed 01/24/2023 No Yes E11.621 Type 2 diabetes mellitus with foot ulcer 01/24/2023 No Yes Z89.422 Acquired absence of other left toe(s) 01/24/2023 No Yes Inactive Problems Resolved Problems Electronic Signature(s) Signed: 03/07/2023 11:02:34 AM By: Kalman Shan DO Entered By: Kalman Shan on 03/07/2023 09:58:43 -------------------------------------------------------------------------------- Progress Note Details Patient Name: Date of Service: [name], [name] 03/07/2023 9:00 A M Medical Record Number: [id number] Patient Account Number: [id number] Date of Birth/Sex: Treating RN: January 23, 1971 (52 y.o. Male) Primary Care Provider: Asencion Noble Other Clinician: Referring Provider: Treating Provider/Extender: Madelynn Done in Treatment: 6 Subjective Chief Complaint Information obtained from Patient 01/15/2023; left foot wound History of Present Illness (HPI) ADMISSION 01/11/2020 This is a 52 year old man with uncontrolled type 2 diabetes with a recent hemoglobin A1c earlier this year of 13.4. He is on  insulin and glipizide. He does not take his blood sugars at home. He does have a follow-up with primary care later this month I believe on January 27. He tells [place] that roughly a month ago he was walking with a shoe with a hole in his foot. He took the shoe off and there was an open wound at roughly the left fourth met head. This has significant undermining and raised edges. He has not noticed any purulence he does not feel unwell. More recently he was taking skin off the bottom of his foot and has a superficial area on the left fifth met head. He has not been offloading this. The patient was in the ER on 12/20. They gave him Bactroban which she has been using on the wound and 10 days worth of doxycycline. No x-rays were done. He has not had vascular studies. He is also been using hydrogen peroxide. Past medical history type 2 diabetes uncontrolled, chronic systolic heart failure, coronary artery disease with a history of congestive heart failure with stents. Hypertension hyperlipidemia and chronic renal insufficiency ABI in this clinic was 1.14 on the left. 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 He is uncertain whether he would be able to work if we put him on some form of restriction [name], [name] ([id number]) [id number] Page 6 of 10 1/19; he is generally doing quite well. Using silver alginate on the wounds. Things actually look better. He has a forefoot offloading boot which she seems to be compliant about. He has support at work to stay off his foot is much as possible which is gratifying. Culture I did last week showed a few Enterococcus faecalis. I am going to put him on Augmentin. I talked about ordering an x-ray in my note last week but that does not seem to have happened. We will review reorder the x-ray this week. 1/26; x-ray reordered last week was negative for osteomyelitis. We are using silver alginate on the  wound on the  third and fifth met heads on the left. He is using a Darco forefoot off loader 2/2; the area on the fifth met head is closed. Third met head is still open with tunneling depth and thick callus. 2/9; the area on the fifth met head remains closed however the third met head again has a small open area on presentation with tunneling in depth and surrounded by thick callus. This looks like a pressure issue. We have been using silver alginate 2/16; the area of the fifth metatarsal head remains closed however the area over the third metatarsal head again is a small open area but with some depth. I do not think this is changed much since last week. He is using Hydrofera Blue with forefoot off loader. He is not able to use a total contact cast on the left leg because he needs his left leg at work [employer]]. Fortunately the wound does not look infected. I changed him to endoform today 2/26; the area of the fifth metatarsal head remains closed. The area of the third metatarsal head has an even smaller opening this time. I used endoform on this this looks improved. He is offloading this is much as he can and a forefoot off loader on the left. He cannot have a total contact cast because of work responsibilities 3/5; the area on the fifth metatarsal head remains closed the area on the third metatarsal head is also closed on the left foot. 01/15/2023 Mr. 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 He has been following for podiatry for several months for this issue. He is currently using wet-to-dry saline dressings. He currently denies signs of infection. Progresse  note from 1/17; Patient presents with a 57-monthistory of nonhealing ulcer to the left foot secondary to diabetes and inability to offload well the area. He has had multiple debridements in the past to his feet bilaterally. He has had resection of the fourth left met head in the fifth toe Secondary to osteomyelitis. We discussed the importance of glycemic control for wound healing. Due to his blood glucose levels being elevated he is at high risk for infection and thus further amputation. He expressed understanding. He states [facility]. Unclear what happened. [facility] we discussed the importance of aggressive offloading for his wound healing. This will be the most difficult part of the treatment plan for the patient to do. We discussed a total contact cast however he has declined that at this time. 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 He is currently using a surgical shoe with a peg assist. It does not fit well so we will give him a new one today. I do not think this is enough offloading. If he is not able to offload this area he will likely end up with a BKA. He is well aware of this. For now I recommended Medihoney and Hydrofera Blue for dressing changes. He will follow-up in  our [place] office Since this is closer for him. 1/26; patient presents for follow-up. Last clinic visit I had seen him in [place] and we transferred him to [place] since this is a closer location for him. Progress note above from that visit. Over the past week He has been using Medihoney and Hydrofera Blue to the wound bed. He has been using his surgical shoe with peg assist. He has no issues or complaints today. We discussed doing the total contact cast and he was agreeable to having this  placed at next clinic visit. 2/2; patient presents for follow-up. He has been using Vashe wet-to-dry dressings. Plan is for the total contact cast today. 2/5; patient presents for follow-up. The cast was placed 3 days ago. He tolerated this well however had a lot of drainage. He currently denies signs of infection. 2/9; patient presents for follow-up. At last clinic visit we held off on replacing the cast due to drainage. He has been using Vashe wet-to-dry dressings and he is currently taking the oral antibiotics prescribed without issues. He declines a total contact cast today. We ran insurance verification for skin substitute and due to cost patient declines having this placed. 2/16; patient presents for follow-up. He has decided not to follow-up with podiatry. He continues to decline the total contact cast. He has been using Vashe wet- to-dry dressings. He has not been wearing his surgical shoe with peg assist. He currently denies signs of infection. 3/1; patient presents for follow-up. He has been doing Vashe wet-to-dry dressings. He has a surgical shoe with peg assist. We discussed potentially doing a skin substitute for which she has been approved for by insurance. He knows the out-of-pocket cost of this and would still like to proceed with having this placed today. He denies the current total contact cast. 3/8; patient presents for follow-up. He states that the skin substitute came off after a few days and he has been using Vashe wet-to-dry dressings. He has mild odor to the wound bed on exam. Patient History Information obtained from Patient. Family History Heart Disease - Mother,Father, No family history of Cancer, Diabetes, Hereditary Spherocytosis, Hypertension, Kidney Disease, Lung Disease, Seizures, Stroke, Thyroid Problems, Tuberculosis. Social History Never smoker, Marital Status - Single, Alcohol Use - Rarely, Drug Use - No History, Caffeine Use - Rarely. Medical  History Eyes Denies history of Cataracts, Glaucoma, Optic Neuritis Ear/Nose/Mouth/Throat Patient has history of Chronic sinus problems/congestion Denies history of Middle ear problems Hematologic/Lymphatic Denies history of Anemia, Hemophilia, Human Immunodeficiency Virus, Lymphedema, Sickle Cell Disease Respiratory Denies history of Aspiration, Asthma, Chronic Obstructive Pulmonary Disease (COPD), Pneumothorax, Sleep Apnea, Tuberculosis Cardiovascular Patient has history of Congestive Heart Failure, Coronary Artery Disease, Hypertension, Myocardial Infarction - age 69 Denies history of Angina, Arrhythmia, Deep Vein Thrombosis, Hypotension, Peripheral Arterial Disease, Peripheral Venous Disease, Phlebitis, Vasculitis Gastrointestinal Denies history of Cirrhosis , Colitis, Crohnoos, Hepatitis A, Hepatitis B, Hepatitis C Endocrine Patient has history of Type II Diabetes Genitourinary Denies history of End Stage Renal Disease Immunological [name], [name] ([id number]) [id number] Page 7 of 10 Denies history of Lupus Erythematosus, Raynaudoos, Scleroderma Integumentary (Skin) Denies history of History of Burn Musculoskeletal Denies history of Gout, Rheumatoid Arthritis, Osteoarthritis, Osteomyelitis Neurologic Patient has history of Neuropathy Denies history of Dementia, Quadriplegia, Paraplegia, Seizure Disorder Oncologic Denies history of Received Chemotherapy, Received Radiation Psychiatric Denies history of Anorexia/bulimia, Confinement Anxiety Hospitalization/Surgery History - Heart Cath in 2018. Medical A Surgical History Notes nd Cardiovascular Ischemic Cardiomyopathy Genitourinary Renal Insufficiency Objective Constitutional respirations regular, non-labored  and within target range for patient.. Vitals Time Taken: 8:48 AM, Height: 78 in, Weight: 275 lbs, BMI: 31.8, Temperature: 98.2 F, Pulse: 84 bpm, Respiratory Rate: 20 breaths/min,  Blood Pressure: 143/101 mmHg. Cardiovascular 2+ dorsalis pedis/posterior tibialis pulses. Psychiatric pleasant and cooperative. General Notes: Left foot: T the lateral aspect, fifth met head there is an open wound with granulation tissue And nonviable tissue. No purulent drainage. Mild o odor on exam. No obvious increase in warmth or erythema. Overall tissue does not appear healthy. Integumentary (Hair, Skin) Wound #3 status is Open. Original cause of wound was Gradually Appeared. The date acquired was: 10/01/2022. The wound has been in treatment 6 weeks. The wound is located on the [place]. The wound measures 1.5cm length x 3cm width x 0.1cm depth; 3.534cm^2 area and 0.353cm^3 volume. There is Fat Layer (Subcutaneous Tissue) exposed. There is no tunneling or undermining noted. There is a medium amount of serosanguineous drainage noted. Foul odor after cleansing was noted. The wound margin is thickened. There is large (67-100%) red granulation within the wound bed. There is no necrotic tissue within the wound bed. The periwound skin appearance exhibited: Callus, Maceration. The periwound skin appearance did not exhibit: Crepitus, Excoriation, Induration, Rash, Scarring, Dry/Scaly, Atrophie Blanche, Cyanosis, Ecchymosis, Hemosiderin Staining, Mottled, Pallor, Rubor, Erythema. Periwound temperature was noted as No Abnormality. The periwound has tenderness on palpation. General Notes: blisters noted to periwound. 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 It is very apparent that he cannot offload this area well. There is more devitalized tissue and blistering occurring. I debrided nonviable tissue. I told the patient that this will likely not heal without aggressive offloading. Since he did not do great with the  skin graft I recommended doing Vashe wet-to-dry dressings. Since there is mild odor on exam I recommended an oral antibiotic. Follow-up in 1 week. Procedures Wound #3 Pre-procedure diagnosis of Wound #3 is a Diabetic Wound/Ulcer of the Lower Extremity located on the Left,Plantar Foot .Severity of Tissue Pre Debridement is: Fat layer exposed. There was a Excisional Skin/Subcutaneous Tissue Debridement with a total area of 4.5 sq cm performed by Kalman Shan, DO. With the following instrument(s): Curette to remove Viable and Non-Viable tissue/material. Material removed includes Callus, Subcutaneous Tissue, Slough, Skin: Dermis, and Skin: Epidermis after achieving pain control using Lidocaine 4% Topical Solution. A time out was conducted at 09:00, prior to the start of the [place] ([id number]) [phone] Page 8 of 10 procedure. A Minimum amount of bleeding was controlled with Pressure. The procedure was tolerated well with a pain level of 0 throughout and a pain level of 0 following the procedure. Post Debridement Measurements: 1.5cm length x 3cm width x 0.1cm depth; 0.353cm^3 volume. Character of Wound/Ulcer Post Debridement is stable. Severity of Tissue Post Debridement is: Fat layer exposed. Post procedure Diagnosis Wound #3: Same as Pre-Procedure Plan Follow-up Appointments: Return Appointment in 1 week. - Dr. Heber [place] and Allayne Butcher Rm # 9 Friday 03/14/23 @ 9:30 Return Appointment in 2 weeks. - Dr. Heber [place] Friday room 8 03/21/2023 0845 Other: - Pick up oral antibiotics. Anesthetic: (In clinic) Topical Lidocaine 5% applied to wound bed Cellular or Tissue Based Products: Cellular or Tissue Based Product Type: - apligraf and grafix 50% coinsurance 02/28/23-apply 1st Grafix 03/07/2023 HOLD THIS WEEK. Bathing/ Shower/ Hygiene: May shower with protection but do not get wound  dressing(s) wet. Protect dressing(s) with water repellant cover (for example, large plastic  bag) or a cast cover and may then take shower. Off-Loading: Other: - keep pressure off left lateral foot; use peg assist as insert in surgical shoe Additional Orders / Instructions: Other: - ice, elevation and ace wrap to foot and ankle to aid in the right medial foot area. The following medication(s) was prescribed: amoxicillin-pot clavulanate oral 875 mg-125 mg tablet 1 1 tablet oral twice a day x 10 days starting 03/07/2023 WOUND #3: - Foot Wound Laterality: Plantar, Left Cleanser: Wound Cleanser 1 x Per Day/15 Days Discharge Instructions: Cleanse the wound with wound cleanser prior to applying a clean dressing using [name] sponges, not tissue or cotton balls. Prim Dressing: Vashe 1 x Per [id number] Days ary Discharge Instructions: wet to dry [name]. Secondary Dressing: Optifoam Non-Adhesive Dressing, 4x4 in 1 x Per Day/15 Days Discharge Instructions: Apply over primary dressing FOAM DONUT . Secured With: [employer], 4.5x3.1 (in/yd) 1 x Per Day/15 Days Discharge Instructions: Secure with Kerlix as directed. Secured With: 64M Medipore H Soft Cloth Surgical T ape, 4 x 10 (in/yd) 1 x Per Day/15 Days Discharge Instructions: Secure with tape as directed. 1. augmentin 2. vashe wet to dry dressings 3. aggressive offloading - surgical shoe with pegassit Electronic Signature(s) Signed: 03/07/2023 11:02:34 AM By: Kalman Shan DO Entered By: Kalman Shan on 03/07/2023 10:07:15 -------------------------------------------------------------------------------- HxROS Details Patient Name: Date of Service: [name] IN, [name] 03/07/2023 9:00 A M Medical Record Number: [id number] Patient Account Number: [id number] Date of Birth/Sex: Treating RN: [birth year] (52 y.o. Male) Primary Care Provider: Asencion Noble Other Clinician: Referring Provider: Treating Provider/Extender: Madelynn Done in Treatment: 6 Information Obtained From Patient Eyes Medical  History: Negative for: Cataracts; Glaucoma; Optic Neuritis Ear/Nose/Mouth/Throat Medical History: Positive for: Chronic sinus problems/congestion Negative for: Middle ear problems Hematologic/Lymphatic Medical HistoryKAZEEM, ZAMBITO ([id number]) [phone].pdf Page 9 of 10 Negative for: Anemia; Hemophilia; Human Immunodeficiency Virus; Lymphedema; Sickle Cell Disease Respiratory Medical History: Negative for: Aspiration; Asthma; Chronic Obstructive Pulmonary Disease (COPD); Pneumothorax; Sleep Apnea; Tuberculosis Cardiovascular Medical History: Positive for: Congestive Heart Failure; Coronary Artery Disease; Hypertension; Myocardial Infarction - age 55 Negative for: Angina; Arrhythmia; Deep Vein Thrombosis; Hypotension; Peripheral Arterial Disease; Peripheral Venous Disease; Phlebitis; Vasculitis Past Medical History Notes: Ischemic Cardiomyopathy Gastrointestinal Medical History: Negative for: Cirrhosis ; Colitis; Crohns; Hepatitis A; Hepatitis B; Hepatitis C Endocrine Medical History: Positive for: Type II Diabetes Time with diabetes: ten years Treated with: Insulin Blood sugar tested every day: No Genitourinary Medical History: Negative for: End Stage Renal Disease Past Medical History Notes: Renal Insufficiency Immunological Medical History: Negative for: Lupus Erythematosus; Raynauds; Scleroderma Integumentary (Skin) Medical History: Negative for: History of Burn Musculoskeletal Medical History: Negative for: Gout; Rheumatoid Arthritis; Osteoarthritis; Osteomyelitis Neurologic Medical History: Positive for: Neuropathy Negative for: Dementia; Quadriplegia; Paraplegia; Seizure Disorder Oncologic Medical History: Negative for: Received Chemotherapy; Received Radiation Psychiatric Medical History: Negative for: Anorexia/bulimia; Confinement Anxiety HBO Extended History Items Ear/Nose/Mouth/Throat: Chronic sinus  problems/congestion Immunizations Pneumococcal Vaccine: Received Pneumococcal Vaccination: No Implantable Devices None [place], Joneen Boers ([id number]) [id number] Page 10 of 10 Hospitalization / Surgery History Type of Hospitalization/Surgery Heart Cath in 2018 Family and Social History Cancer: No; Diabetes: No; Heart Disease: Yes - Mother,Father; Hereditary Spherocytosis: No; Hypertension: No; Kidney Disease: No; Lung Disease: No; Seizures: No; Stroke: No; Thyroid Problems: No; Tuberculosis: No; Never smoker; Marital Status - Single; Alcohol Use: Rarely; Drug Use: No History; Caffeine Use: Rarely; Financial Concerns: No; Food, Clothing or  Shelter Needs: No; Support System Lacking: No; Transportation Concerns: No Electronic Signature(s) Signed: 03/07/2023 11:02:34 AM By: Kalman Shan DO Entered By: Kalman Shan on 03/07/2023 09:59:43 -------------------------------------------------------------------------------- SuperBill Details Patient Name: Date of Service: [name] IN, [name] 03/07/2023 Medical Record Number: [id number] Patient Account Number: [id number] Date of Birth/Sex: Treating RN: [birth year] (51 y.o. Male) Deon Pilling Primary Care Provider: Asencion Noble Other Clinician: Referring Provider: Treating Provider/Extender: Madelynn Done in Treatment: 6 Diagnosis Coding ICD-10 Codes Code Description [phone] 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.621 Type 2 diabetes mellitus with foot ulcer Z89.422 Acquired absence of other left toe(s) Facility Procedures : CPT4 Code: [id number] Description: [id number] - DEB SUBQ TISSUE 20 SQ CM/< ICD-10 Diagnosis Description L97.522 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.621 Type 2 diabetes mellitus with foot ulcer Modifier: Quantity: 1 Physician Procedures : CPT4 Code Description Modifier [id number] -  WC PHYS SUBQ TISS 20 SQ CM ICD-10 Diagnosis Description L97.522 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.621 Type 2 diabetes mellitus with foot ulcer Quantity: 1 Electronic Signature(s) Signed: 03/07/2023 11:02:34 AM By: Kalman Shan DO Entered By: Kalman Shan on 03/07/2023 10:07:27

## 2023-03-08 NOTE — Progress Notes (Signed)
[name], [name] ([id number]) [id number] Page 1 of 7 Visit Report for 01/31/2023 Arrival Information Details Patient Name: Date of Service: [name] [name], [name] 01/31/2023 8:00 A M Medical Record Number: [id number] Patient Account Number: [id number] Date of Birth/Sex: Treating RN: 07/15/1971 (52 y.o. Male) Rolin Barry, Glenn Primary Care Arnaldo Heffron: Asencion Noble Other Clinician: Referring Makya Yurko: Treating Maahir Horst/Extender: Madelynn Done in Treatment: 1 Visit Information History Since Last Visit Added or deleted any medications: No Patient Arrived: Ambulatory Any new allergies or adverse reactions: No Arrival Time: 07:48 Had a fall or experienced change in No Accompanied By: self activities of daily living that may affect Transfer Assistance: None risk of falls: Patient Identification Verified: Yes Signs or symptoms of abuse/neglect since last visito No Secondary Verification Process Completed: Yes Hospitalized since last visit: No Patient Requires Transmission-Based Precautions: No Implantable device outside of the clinic excluding No Patient Has Alerts: No cellular tissue based products placed in the center since last visit: Has Dressing in Place as Prescribed: Yes Pain Present Now: No Electronic Signature(s) Signed: 01/31/2023 6:10:45 PM By: Glenn Pilling RN, BSN Entered By: [name] on 01/31/2023 07:49:54 -------------------------------------------------------------------------------- Encounter Discharge Information Details Patient Name: Date of Service: [name] [place], HA RO [name] 01/31/2023 8:00 A M Medical Record Number: [id number] Patient Account Number: [id number] Date of Birth/Sex: Treating RN: September 20, 1971 (51 y.o. Male) Rhae Hammock Primary Care Sandeep Delagarza: Asencion Noble Other Clinician: Referring Madolin Twaddle: Treating Rakin Lemelle/Extender: Madelynn Done in Treatment: 1 Encounter Discharge  Information Items Discharge Condition: Stable Ambulatory Status: Ambulatory Discharge Destination: Home Transportation: Private Auto Accompanied By: self Schedule Follow-up Appointment: Yes Clinical Summary of Care: Patient Declined Electronic Signature(s) Signed: 03/06/2023 4:33:55 PM By: Rhae Hammock RN Entered By: Rhae Hammock on 01/31/2023 08:43:57 -------------------------------------------------------------------------------- Lower Extremity Assessment Details Patient Name: Date of Service: [name] [place], HA RO [name] 01/31/2023 8:00 A M Medical Record Number: [id number] Patient Account Number: [id number] Date of Birth/Sex: Treating RN: 07-24-1971 (51 y.o. Male) [name] Primary Care Ambri Miltner: Asencion Noble Other Clinician: Referring Zira Helinski: Treating [name]/Extender: Madelynn Done in Treatment: 1 Edema Assessment Assessed: [name]: Yes] [name]: No] F[LeftCyd [name] [id number] [[name] Page 2 of 7] Edema: [Left: N] [Right: o] Calf Left: Right: Point of Measurement: 43 cm From Medial Instep 39 cm Ankle Left: Right: Point of Measurement: 9 cm From Medial Instep 23 cm Vascular Assessment Pulses: Dorsalis Pedis Palpable: [Left:Yes] Electronic Signature(s) Signed: 01/31/2023 6:10:45 PM By: Glenn Pilling RN, BSN Entered By: [name] on 01/31/2023 07:51:04 -------------------------------------------------------------------------------- Multi Wound Chart Details Patient Name: Date of Service: [name] [place], HA RO [name] 01/31/2023 8:00 A M Medical Record Number: [id number] Patient Account Number: [id number] Date of Birth/Sex: Treating RN: Nov 16, 1971 (52 y.o. Male) Primary Care Bonney Berres: Asencion Noble Other Clinician: Referring Dewey Neukam: Treating Khyle Goodell/Extender: Madelynn Done in Treatment: 1 Vital Signs Height(in): 78 Capillary Blood Glucose(mg/dl): 171 Weight(lbs):  275 Pulse(bpm): 26 Body Mass Index(BMI): 31.8 Blood Pressure(mmHg): 129/88 Temperature(F): 97.9 Respiratory Rate(breaths/min): 20 [3:Photos:] [N/A:N/A] Left, Plantar Foot N/A N/A Wound Location: Gradually Appeared N/A N/A Wounding Event: Diabetic Wound/Ulcer of the Lower N/A N/A Primary Etiology: Extremity Chronic sinus problems/congestion, N/A N/A Comorbid History: Congestive Heart Failure, Coronary Artery Disease, Hypertension, Myocardial Infarction, Type II Diabetes, Neuropathy 10/01/2022 N/A N/A Date Acquired: 1 N/A N/A Weeks of Treatment: Open N/A N/A Wound Status: No N/A N/A Wound Recurrence: 2.2x2.1x0.2 N/A N/A Measurements L x W x D (cm) 3.629 N/A N/A A (cm) : rea 0.726 N/A N/A  Volume (cm) : 7.60% N/A N/A % Reduction in A rea: 7.50% N/A N/A % Reduction in Volume: Grade 1 N/A N/A Classification: Medium N/A N/A Exudate A mount: Serosanguineous N/A N/A Exudate Type: red, brown N/A N/A Exudate Color: Distinct, outline attached N/A N/A Wound Margin: Large (67-100%) N/A N/A Granulation A mount: Red, Pink N/A N/A Granulation QualityTHOMS, [name] ([id number] Page 3 of 7 None Present (0%) N/A N/A Necrotic Amount: Fat Layer (Subcutaneous Tissue): Yes N/A N/A Exposed Structures: Fascia: No Tendon: No Muscle: No Joint: No Bone: No Small (1-33%) N/A N/A Epithelialization: Callus: Yes N/A N/A Periwound Skin Texture: Excoriation: No Induration: No Crepitus: No Rash: No Scarring: No Dry/Scaly: Yes N/A N/A Periwound Skin Moisture: Maceration: No Atrophie Blanche: No N/A N/A Periwound Skin Color: Cyanosis: No Ecchymosis: No Erythema: No Hemosiderin Staining: No Mottled: No Pallor: No Rubor: No T Contact Cast otal N/A N/A Procedures Performed: Treatment Notes Wound #3 (Foot) Wound Laterality: Plantar, Left Cleanser Wound Cleanser Discharge Instruction: Cleanse the wound with wound cleanser  prior to applying a clean dressing using [name] sponges, not tissue or cotton balls. Peri-Wound Care Topical Gentamicin Discharge Instruction: As directed by physician Mupirocin Ointment Discharge Instruction: Apply Mupirocin (Bactroban) as instructed Primary Dressing Hydrofera Blue Ready Transfer Foam, 4x5 (in/in) Discharge Instruction: Apply to wound bed as instructed Secondary Dressing ABD Pad, 5x9 Discharge Instruction: Apply over primary dressing as directed. Secured With [employer], 4.5x3.1 (in/yd) Discharge Instruction: Secure with Kerlix as directed. 35M Medipore H Soft Cloth Surgical T ape, 4 x 10 (in/yd) Discharge Instruction: Secure with tape as directed. Compression Wrap Compression Stockings Add-Ons Electronic Signature(s) Signed: 01/31/2023 11:56:15 AM By: Glenn Shan DO Entered By: [name] on 01/31/2023 08:49:38 -------------------------------------------------------------------------------- Multi-Disciplinary Care Plan Details Patient Name: Date of Service: [name] [place], HA RO [name] 01/31/2023 8:00 A M Medical Record Number: [id number] Patient Account Number: [id number] Date of Birth/Sex: Treating RN: 30-Nov-1971 (51 y.o. Male) Rhae Hammock Primary Care Kayshaun Polanco: Asencion Noble Other Clinician: Referring Bazil Dhanani: Treating Riot Waterworth/Extender: Madelynn Done in Treatment: [place], [name] ([id number]) [id number] Page 4 of 7 Active Inactive Wound/Skin Impairment Nursing Diagnoses: Impaired tissue integrity Knowledge deficit related to ulceration/compromised skin integrity Goals: Patient will have a decrease in wound volume by X% from date: (specify in notes) Date Initiated: 01/24/2023 Target Resolution Date: 02/07/2023 Goal Status: Active Patient/caregiver will verbalize understanding of skin care regimen Date Initiated: 01/24/2023 Target Resolution Date: 02/07/2023 Goal Status:  Active Ulcer/skin breakdown will have a volume reduction of 30% by week 4 Date Initiated: 01/24/2023 Target Resolution Date: 02/08/2023 Goal Status: Active Ulcer/skin breakdown will have a volume reduction of 50% by week 8 Date Initiated: 01/24/2023 Target Resolution Date: 02/08/2023 Goal Status: Active Interventions: Assess patient/caregiver ability to obtain necessary supplies Assess patient/caregiver ability to perform ulcer/skin care regimen upon admission and as needed Assess ulceration(s) every visit Notes: Electronic Signature(s) Signed: 01/31/2023 8:21:34 AM By: Rhae Hammock RN Entered By: Rhae Hammock on 01/31/2023 08:06:39 -------------------------------------------------------------------------------- Pain Assessment Details Patient Name: Date of Service: [name] [place], HA RO [name] 01/31/2023 8:00 [place] Record Number: [id number] Patient Account Number: [id number] Date of Birth/Sex: Treating RN: [birth year] (51 y.o. Male) [name] Primary Care Joanthan Hlavacek: Asencion Noble Other Clinician: Referring Everett Ricciardelli: Treating Vint Pola/Extender: Madelynn Done in Treatment: 1 Active Problems Location of Pain Severity and Description of Pain Patient Has Paino No Site Locations Rate the pain. Current Pain Level: 0 Pain Management and Medication Current Pain Management: Medication: No Cold Application:  No Rest: No Massage: No [name], [name] ([id number]) [id number] Page 5 of 7 Activity: No T.E.N.S.: No Heat Application: No Leg drop or elevation: No Is the Current Pain Management Adequate: Adequate How does your wound impact your activities of daily livingo Sleep: No Bathing: No Appetite: No Relationship With Others: No Bladder Continence: No Emotions: No Bowel Continence: No Work: No Toileting: No Drive: No Dressing: No Hobbies: No Engineer, maintenance) Signed: 01/31/2023 6:10:45 PM By: Glenn Pilling RN,  BSN Entered By: [name] on 01/31/2023 07:50:52 -------------------------------------------------------------------------------- Patient/Caregiver Education Details Patient Name: Date of Service: [name] [place], HA RO [name] 2/2/2024andnbsp8:00 A M Medical Record Number: [id number] Patient Account Number: [id number] Date of Birth/Gender: Treating RN: 25-Oct-1971 (51 y.o. Male) Rhae Hammock Primary Care Physician: Asencion Noble Other Clinician: Referring Physician: Treating Physician/Extender: Madelynn Done in Treatment: 1 Education Assessment Education Provided To: Patient Education Topics Provided Wound/Skin Impairment: Methods: Explain/Verbal Responses: Reinforcements needed, State content correctly Electronic Signature(s) Signed: 01/31/2023 8:21:34 AM By: Rhae Hammock RN Entered By: Rhae Hammock on 01/31/2023 08:06:49 -------------------------------------------------------------------------------- Wound Assessment Details Patient Name: Date of Service: [name] [place], HA RO [name] 01/31/2023 8:00 A M Medical Record Number: [id number] Patient Account Number: [id number] Date of Birth/Sex: Treating RN: 08/16/1971 (51 y.o. Male) [name] Primary Care Dajsha Massaro: Asencion Noble Other Clinician: Referring Valli Randol: Treating Zoha Spranger/Extender: Madelynn Done in Treatment: 1 Wound Status Wound Number: 3 Primary Diabetic Wound/Ulcer of the Lower Extremity Etiology: Wound Location: Left, Plantar Foot Wound Open Wounding Event: Gradually Appeared Status: Date Acquired: 10/01/2022 Comorbid Chronic sinus problems/congestion, Congestive Heart Failure, Weeks Of Treatment: 1 History: Coronary Artery Disease, Hypertension, Myocardial Infarction, Type Clustered Wound: No II Diabetes, Neuropathy Photos [name], [name] ([id number]) [id number] Page 6 of 7 Wound Measurements Length: (cm) 2.2 Width: (cm)  2.1 Depth: (cm) 0.2 Area: (cm) 3.629 Volume: (cm) 0.726 % Reduction in Area: 7.6% % Reduction in Volume: 7.5% Epithelialization: Small (1-33%) Tunneling: No Undermining: No Wound Description Classification: Grade 1 Wound Margin: Distinct, outline attached Exudate Amount: Medium Exudate Type: Serosanguineous Exudate Color: red, brown Foul Odor After Cleansing: No Slough/Fibrino No Wound Bed Granulation Amount: Large (67-100%) Exposed Structure Granulation Quality: Red, Pink Fascia Exposed: No Necrotic Amount: None Present (0%) Fat Layer (Subcutaneous Tissue) Exposed: Yes Tendon Exposed: No Muscle Exposed: No Joint Exposed: No Bone Exposed: No Periwound Skin Texture Texture Color No Abnormalities Noted: No No Abnormalities Noted: No Callus: Yes Atrophie Blanche: No Crepitus: No Cyanosis: No Excoriation: No Ecchymosis: No Induration: No Erythema: No Rash: No Hemosiderin Staining: No Scarring: No Mottled: No Pallor: No Moisture Rubor: No No Abnormalities Noted: No Dry / Scaly: Yes Maceration: No Electronic Signature(s) Signed: 01/31/2023 6:10:45 PM By: Glenn Pilling RN, BSN Entered By: [name] on 01/31/2023 07:54:49 -------------------------------------------------------------------------------- Vitals Details Patient Name: Date of Service: [name] [place], HA RO [name] 01/31/2023 8:00 A M Medical Record Number: [id number] Patient Account Number: [id number] Date of Birth/Sex: Treating RN: [birth year] (51 y.o. Male) [name] Primary Care Lajuane Leatham: Asencion Noble Other Clinician: Referring Anwita Mencer: Treating Keian Odriscoll/Extender: Madelynn Done in Treatment: 1 Vital Signs Time Taken: 07:53 Temperature (F): 97.9 Height (in): 78 Pulse (bpm): 87 Weight (lbs): 275 Respiratory Rate (breaths/min): 20 Body Mass Index (BMI): 31.8 Blood Pressure (mmHg): 129/88 Capillary Blood Glucose (mg/dl): [place], [name] ([id number] Page 7 of 7 Reference Range: 80 - 120 mg / dl Electronic Signature(s) Signed: 01/31/2023 6:10:45 PM By: Glenn Pilling RN, BSN Entered By: [name] on 01/31/2023 07:54:29

## 2023-03-08 NOTE — Progress Notes (Signed)
GAGAN, MCELHONE ([id number]) [id number] Page 1 of 6 Visit Report for 02/28/2023 Arrival Information Details Patient Name: Date of Service: [name] [place], [place] RO LD 02/28/2023 9:00 A M Medical Record Number: [id number] Patient Account Number: [id number] Date of Birth/Sex: Treating RN: 1971-08-15 (52 y.o. Male) Rhae Hammock Primary Care Evyn Kooyman: Asencion Noble Other Clinician: Referring Khalessi Blough: Treating Braxen Dobek/Extender: Madelynn Done in Treatment: 5 Visit Information History Since Last Visit Added or deleted any medications: No Patient Arrived: Ambulatory Any new allergies or adverse reactions: No Arrival Time: 08:28 Had a fall or experienced change in No Accompanied By: self activities of daily living that may affect Transfer Assistance: None risk of falls: Patient Identification Verified: Yes Signs or symptoms of abuse/neglect since last visito No Secondary Verification Process Completed: Yes Hospitalized since last visit: No Patient Requires Transmission-Based Precautions: No Implantable device outside of the clinic excluding No Patient Has Alerts: Yes cellular tissue based products placed in the center Patient Alerts: 08/2021 ABI:1.2 TBI 0.79 since last visit: Has Dressing in Place as Prescribed: Yes Pain Present Now: No Electronic Signature(s) Signed: 03/06/2023 4:33:02 PM By: Rhae Hammock RN Entered By: Rhae Hammock on 02/28/2023 08:28:29 -------------------------------------------------------------------------------- Encounter Discharge Information Details Patient Name: Date of Service: [name] [place], [name] 02/28/2023 9:00 A M Medical Record Number: [id number] Patient Account Number: [id number] Date of Birth/Sex: Treating RN: 29-Jul-1971 (51 y.o. Male) Rhae Hammock Primary Care Brizza Nathanson: Asencion Noble Other Clinician: Referring Ignazio Kincaid: Treating Isay Perleberg/Extender: Madelynn Done in Treatment: 5 Encounter Discharge Information Items Post Procedure Vitals Discharge Condition: Stable Temperature (F): 98.7 Ambulatory Status: Ambulatory Pulse (bpm): 74 Discharge Destination: Home Respiratory Rate (breaths/min): 17 Transportation: Private Auto Blood Pressure (mmHg): 120/80 Accompanied By: self Schedule Follow-up Appointment: Yes Clinical Summary of Care: Patient Declined Electronic Signature(s) Signed: 03/06/2023 4:33:02 PM By: Rhae Hammock RN Entered By: Rhae Hammock on 02/28/2023 09:46:01 -------------------------------------------------------------------------------- Lower Extremity Assessment Details Patient Name: Date of Service: [name] [place], [name] 02/28/2023 9:00 A M Medical Record Number: [id number] Patient Account Number: [id number] Date of Birth/Sex: Treating RN: 04/16/1971 (51 y.o. Male) Rhae Hammock Primary Care [place] Basham: Asencion Noble Other Clinician: Referring Shacoria Latif: Treating Ashok Sawaya/Extender: Madelynn Done in Treatment: 5 Edema Assessment Assessed: Shirlyn Goltz: Yes] Patrice ParadiseMarland Kitchen No] F[LeftRODERRICK, GROESBECK [id number] [Right: [id number] Page 2 of 6] Edema: [Left: N] [Right: o] Calf Left: Right: Point of Measurement: 43 cm From Medial Instep 38.5 cm Ankle Left: Right: Point of Measurement: 9 cm From Medial Instep 22 cm Vascular Assessment Pulses: Dorsalis Pedis Palpable: [Left:Yes] Posterior Tibial Palpable: [Left:Yes] Electronic Signature(s) Signed: 03/06/2023 4:33:02 PM By: Rhae Hammock RN Entered By: Rhae Hammock on 02/28/2023 08:29:05 -------------------------------------------------------------------------------- Multi Wound Chart Details Patient Name: Date of Service: [name], [name] 02/28/2023 9:00 A M Medical Record Number: [id number] Patient Account Number: [id number] Date of Birth/Sex: Treating RN: Aug 25, 1971 (52 y.o. 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 Etiology: Extremity Chronic sinus problems/congestion, Comorbid History: Congestive Heart Failure, Coronary Artery Disease,  Hypertension, Myocardial Infarction, Type II Diabetes, Neuropathy 10/01/2022 Date Acquired: 5 Weeks of Treatment: Open Wound Status: No Wound Recurrence: Yes Pending [name] on Presentation: 1.4x1.2x0.2 Measurements L x W x D (  cm) 1.319 A (cm) : rea  0.264 Volume (cm) : 66.40% % Reduction in A rea: 66.40% % Reduction in Volume: Grade 1 Classification: Medium Exudate A mount: Serosanguineous Exudate Type: red, brown Exudate Color: Distinct, outline attached Wound Margin: Large (67-100%) Granulation A  mount: Red Granulation Quality: None Present (0%) Necrotic A mount: Fat Layer (Subcutaneous Tissue): Yes N/A Exposed Structures: Fascia: No Tendon: No Muscle: No] [N/A:N/A N/A N/A N/A N/A N/A N/A N/A N/A N/A N/A N/A N/A N/A N/A N/A N/A N/A N/A N/A N/A  N/A N/A] [name], [name] ([id number]) [3:Joint: No Bone: No Small (1-33%) Epithelialization: Excoriation: No Periwound Skin Texture: Induration: No Callus: No Crepitus: No Rash: No Scarring: No Maceration: No Periwound Skin Moisture: Dry/Scaly: No Atrophie Blanche: No Periwound Skin Color:  Cyanosis: No Ecchymosis: No Erythema: No Hemosiderin Staining: No Mottled: No Pallor: No Rubor: No No Abnormality Temperature: Yes Tenderness on Palpation: Cellular or Tissue Based Product Procedures Performed:] [N/A:N/A N/A N/A N/A N/A N/A N/A] Treatment Notes Wound #3 (Foot) Wound  Laterality: Plantar, Left Cleanser Wound Cleanser Discharge Instruction: Cleanse the wound with wound cleanser prior to applying a clean dressing using [name] sponges, not tissue or cotton balls. Peri-Wound Care Zinc Oxide Ointment 30g tube Discharge Instruction: Apply Zinc Oxide to periwound with each dressing change Topical Primary Dressing Grafix Discharge Instruction: apply directly to wound bed. Secondary Dressing ABD Pad, 5x9 Discharge Instruction: Apply over primary dressing as directed. Secured With [employer], 4.5x3.1 (in/yd) Discharge Instruction: Secure with Kerlix as directed. 35M Medipore H Soft Cloth Surgical T ape, 4 x 10 (in/yd) Discharge Instruction: Secure with tape as directed. Compression Wrap Compression Stockings Add-Ons Electronic Signature(s) Signed: 02/28/2023 12:11:35 PM By: Kalman Shan DO Entered By: Kalman Shan on 02/28/2023 10:24:51 -------------------------------------------------------------------------------- Multi-Disciplinary Care Plan Details Patient Name: Date of Service: [name], [name] 02/28/2023 9:00 A M Medical Record Number: [id number] Patient Account Number: [id number] Date of Birth/Sex: Treating RN: 12/18/1971 (51 y.o. Male) Rhae Hammock Primary Care Lillyahna Hemberger: Asencion Noble Other Clinician: Referring Jahking Lesser: Treating Fernado Brigante/Extender: Madelynn Done in Treatment: [address] [name], [name] ([id number]) [id number] Page 4 of 6 Wound/Skin Impairment Nursing Diagnoses: Impaired tissue integrity Knowledge deficit related to ulceration/compromised skin integrity Goals: Patient will have a decrease in wound volume by X% from date: (specify in notes) Date Initiated: 01/24/2023 Target Resolution Date: 03/29/2023 Goal Status: Active Patient/caregiver will verbalize understanding of skin care regimen Date Initiated: 01/24/2023 Target Resolution Date:  03/28/2023 Goal Status: Active Ulcer/skin breakdown will have a volume reduction of 30% by week 4 Date Initiated: 01/24/2023 Target Resolution Date: 02/27/2023 Goal Status: Active Ulcer/skin breakdown will have a volume reduction of 50% by week 8 Date Initiated: 01/24/2023 Target Resolution Date: 03/28/2023 Goal Status: Active Interventions: Assess patient/caregiver ability to obtain necessary supplies Assess patient/caregiver ability to perform ulcer/skin care regimen upon admission and as needed Assess ulceration(s) every visit Notes: Electronic Signature(s) Signed: 03/06/2023 4:33:02 PM By: Rhae Hammock RN Entered By: Rhae Hammock on 02/28/2023 08:45:46 -------------------------------------------------------------------------------- Pain Assessment Details Patient Name: Date of Service: [name], [name] 02/28/2023 9:00 A M Medical Record Number: [id number] Patient Account Number: [id number] Date of Birth/Sex: Treating RN: [birth year] (51 y.o. Male) Rhae Hammock Primary Care Sahara Fujimoto: Asencion Noble Other Clinician: Referring Khadeja Abt: Treating Keelynn Furgerson/Extender: Madelynn Done in Treatment: 5 Active Problems Location of Pain Severity and Description of Pain Patient Has Paino No Site Locations Pain Management and Medication Current Pain Management: Electronic Signature(s) Signed: 03/06/2023 4:33:02  PM By: Rhae Hammock RN Entered By: Rhae Hammock on 02/28/2023 08:28:59 [name] ([id number]) [id number] Page 5 of 6 -------------------------------------------------------------------------------- Patient/Caregiver Education Details Patient Name: Date of Service: [name] [place], [place] RO LD 3/1/2024andnbsp9:00 A M Medical Record Number: [id number] Patient Account Number: [id number] Date of Birth/Gender: Treating RN: June 13, 1971 (52 y.o. Male) Rhae Hammock Primary Care Physician: Asencion Noble Other  Clinician: Referring Physician: Treating Physician/Extender: Madelynn Done in Treatment: 5 Education Assessment Education Provided To: Patient Education Topics Provided Wound/Skin Impairment: Methods: Explain/Verbal Responses: Reinforcements needed, State content correctly Electronic Signature(s) Signed: 03/06/2023 4:33:02 PM By: Rhae Hammock RN Entered By: Rhae Hammock on 02/28/2023 08:45:56 -------------------------------------------------------------------------------- Wound Assessment Details Patient Name: Date of Service: [name] 02/28/2023 9:00 A M Medical Record Number: [id number] Patient Account Number: [id number] Date of Birth/Sex: Treating RN: 05/30/1971 (51 y.o. Male) Rhae Hammock Primary Care Zyana Amaro: Asencion Noble Other Clinician: Referring Jessyca Sloan: Treating Bri Wakeman/Extender: Madelynn Done in Treatment: 5 Wound Status Wound Number: 3 Primary Diabetic Wound/Ulcer of the Lower Extremity Etiology: Wound Location: Left, Plantar Foot Wound Open Wounding Event: Gradually Appeared Status: Date Acquired: 10/01/2022 Comorbid Chronic sinus problems/congestion, Congestive Heart Failure, Weeks Of Treatment: 5 History: Coronary Artery Disease, Hypertension, Myocardial Infarction, Type Clustered Wound: No II Diabetes, Neuropathy Pending Amputation On Presentation Wound under treatment by Aidyn Sportsman outside of Wound Center Wound Measurements Length: (cm) 1.4 Width: (cm) 1.2 Depth: (cm) 0.2 Area: (cm) 1.319 Volume: (cm) 0.264 % Reduction in Area: 66.4% % Reduction in Volume: 66.4% Epithelialization: Small (1-33%) Tunneling: No Undermining: No Wound Description Classification: Grade 1 Wound Margin: Distinct, outline attached Exudate Amount: Medium Exudate Type: Serosanguineous Exudate Color: red, brown Foul Odor After Cleansing: No Slough/Fibrino No Wound Bed Granulation Amount: Large  (67-100%) Exposed Structure Granulation Quality: Red Fascia Exposed: No Necrotic Amount: None Present (0%) Fat Layer (Subcutaneous Tissue) Exposed: Yes Tendon Exposed: No Muscle Exposed: No Joint Exposed: No Bone Exposed: No [name], [name] ([id number]) [id number] Page 6 of 6 Periwound Skin Texture Texture Color No Abnormalities Noted: No No Abnormalities Noted: No Callus: No Atrophie Blanche: No Crepitus: No Cyanosis: No Excoriation: No Ecchymosis: No Induration: No Erythema: No Rash: No Hemosiderin Staining: No Scarring: No Mottled: No Pallor: No Moisture Rubor: No No Abnormalities Noted: No Dry / Scaly: No Temperature / Pain Maceration: No Temperature: No Abnormality Tenderness on Palpation: Yes Electronic Signature(s) Signed: 03/06/2023 4:33:02 PM By: Rhae Hammock RN Entered By: Rhae Hammock on 02/28/2023 08:32:11 -------------------------------------------------------------------------------- Vitals Details Patient Name: Date of Service: [name], [name] 02/28/2023 9:00 A M Medical Record Number: [id number] Patient Account Number: [id number] Date of Birth/Sex: Treating RN: [birth year] (51 y.o. Male) Rhae Hammock Primary Care Betsi Crespi: Asencion Noble Other Clinician: Referring Tinna Kolker: Treating Anjelique Makar/Extender: Madelynn Done in Treatment: 5 Vital Signs Time Taken: 08:28 Temperature (F): 98.7 Height (in): 78 Pulse (bpm): 74 Weight (lbs): 275 Respiratory Rate (breaths/min): 17 Body Mass Index (BMI): 31.8 Blood Pressure (mmHg): 132/93 Reference Range: 80 - 120 mg / dl Electronic Signature(s) Signed: 03/06/2023 4:33:02 PM By: Rhae Hammock RN Entered By: Rhae Hammock on 02/28/2023 08:28:53

## 2023-03-13 ENCOUNTER — Other Ambulatory Visit: Payer: Self-pay

## 2023-03-13 ENCOUNTER — Other Ambulatory Visit: Payer: Self-pay | Admitting: Critical Care Medicine

## 2023-03-14 ENCOUNTER — Encounter (HOSPITAL_BASED_OUTPATIENT_CLINIC_OR_DEPARTMENT_OTHER): Payer: 59 | Admitting: Internal Medicine

## 2023-03-14 ENCOUNTER — Other Ambulatory Visit: Payer: Self-pay

## 2023-03-14 DIAGNOSIS — Z89422 Acquired absence of other left toe(s): Secondary | ICD-10-CM | POA: Diagnosis not present

## 2023-03-14 DIAGNOSIS — I252 Old myocardial infarction: Secondary | ICD-10-CM | POA: Diagnosis not present

## 2023-03-14 DIAGNOSIS — E1140 Type 2 diabetes mellitus with diabetic neuropathy, unspecified: Secondary | ICD-10-CM | POA: Diagnosis not present

## 2023-03-14 DIAGNOSIS — N189 Chronic kidney disease, unspecified: Secondary | ICD-10-CM | POA: Diagnosis not present

## 2023-03-14 DIAGNOSIS — I130 Hypertensive heart and chronic kidney disease with heart failure and stage 1 through stage 4 chronic kidney disease, or unspecified chronic kidney disease: Secondary | ICD-10-CM | POA: Diagnosis not present

## 2023-03-14 DIAGNOSIS — L97522 Non-pressure chronic ulcer of other part of left foot with fat layer exposed: Secondary | ICD-10-CM

## 2023-03-14 DIAGNOSIS — Z794 Long term (current) use of insulin: Secondary | ICD-10-CM | POA: Diagnosis not present

## 2023-03-14 DIAGNOSIS — E11621 Type 2 diabetes mellitus with foot ulcer: Secondary | ICD-10-CM | POA: Diagnosis not present

## 2023-03-14 DIAGNOSIS — I2510 Atherosclerotic heart disease of native coronary artery without angina pectoris: Secondary | ICD-10-CM | POA: Diagnosis not present

## 2023-03-14 DIAGNOSIS — Z7984 Long term (current) use of oral hypoglycemic drugs: Secondary | ICD-10-CM | POA: Diagnosis not present

## 2023-03-14 DIAGNOSIS — Z955 Presence of coronary angioplasty implant and graft: Secondary | ICD-10-CM | POA: Diagnosis not present

## 2023-03-14 DIAGNOSIS — I5042 Chronic combined systolic (congestive) and diastolic (congestive) heart failure: Secondary | ICD-10-CM | POA: Diagnosis not present

## 2023-03-16 DIAGNOSIS — G4733 Obstructive sleep apnea (adult) (pediatric): Secondary | ICD-10-CM | POA: Diagnosis not present

## 2023-03-17 ENCOUNTER — Other Ambulatory Visit: Payer: Self-pay

## 2023-03-18 ENCOUNTER — Other Ambulatory Visit: Payer: Self-pay

## 2023-03-20 ENCOUNTER — Other Ambulatory Visit: Payer: Self-pay

## 2023-03-20 ENCOUNTER — Encounter (HOSPITAL_BASED_OUTPATIENT_CLINIC_OR_DEPARTMENT_OTHER): Payer: 59 | Admitting: Internal Medicine

## 2023-03-20 ENCOUNTER — Other Ambulatory Visit: Payer: Self-pay | Admitting: Critical Care Medicine

## 2023-03-20 ENCOUNTER — Other Ambulatory Visit: Payer: Self-pay | Admitting: Pharmacist

## 2023-03-20 DIAGNOSIS — N189 Chronic kidney disease, unspecified: Secondary | ICD-10-CM | POA: Diagnosis not present

## 2023-03-20 DIAGNOSIS — Z794 Long term (current) use of insulin: Secondary | ICD-10-CM | POA: Diagnosis not present

## 2023-03-20 DIAGNOSIS — I130 Hypertensive heart and chronic kidney disease with heart failure and stage 1 through stage 4 chronic kidney disease, or unspecified chronic kidney disease: Secondary | ICD-10-CM | POA: Diagnosis not present

## 2023-03-20 DIAGNOSIS — I5042 Chronic combined systolic (congestive) and diastolic (congestive) heart failure: Secondary | ICD-10-CM | POA: Diagnosis not present

## 2023-03-20 DIAGNOSIS — E1140 Type 2 diabetes mellitus with diabetic neuropathy, unspecified: Secondary | ICD-10-CM | POA: Diagnosis not present

## 2023-03-20 DIAGNOSIS — E11621 Type 2 diabetes mellitus with foot ulcer: Secondary | ICD-10-CM

## 2023-03-20 DIAGNOSIS — Z955 Presence of coronary angioplasty implant and graft: Secondary | ICD-10-CM | POA: Diagnosis not present

## 2023-03-20 DIAGNOSIS — L97522 Non-pressure chronic ulcer of other part of left foot with fat layer exposed: Secondary | ICD-10-CM

## 2023-03-20 DIAGNOSIS — I252 Old myocardial infarction: Secondary | ICD-10-CM | POA: Diagnosis not present

## 2023-03-20 DIAGNOSIS — Z89422 Acquired absence of other left toe(s): Secondary | ICD-10-CM | POA: Diagnosis not present

## 2023-03-20 DIAGNOSIS — Z7984 Long term (current) use of oral hypoglycemic drugs: Secondary | ICD-10-CM | POA: Diagnosis not present

## 2023-03-20 DIAGNOSIS — I2510 Atherosclerotic heart disease of native coronary artery without angina pectoris: Secondary | ICD-10-CM | POA: Diagnosis not present

## 2023-03-20 MED ORDER — BASAGLAR KWIKPEN 100 UNIT/ML ~~LOC~~ SOPN
50.0000 [IU] | PEN_INJECTOR | Freq: Every day | SUBCUTANEOUS | 0 refills | Status: DC
  Filled 2023-03-20: qty 15, 30d supply, fill #0

## 2023-03-21 ENCOUNTER — Encounter (HOSPITAL_BASED_OUTPATIENT_CLINIC_OR_DEPARTMENT_OTHER): Payer: 59 | Admitting: Internal Medicine

## 2023-03-21 NOTE — Progress Notes (Signed)
HAARIS, PARIS ([id number]) [id number] Page 1 of 10 Visit Report for 03/20/2023 Chief Complaint Document Details Patient Name: Date of Service: Cherrie Gauze [place], [place] RO LD 03/20/2023 1:30 PM Medical Record Number: [id number] Patient Account Number: [id number] Date of Birth/Sex: Treating RN: 08-Apr-1971 (52 y.o. M) Primary Care Provider: Asencion Noble Other Clinician: Referring Provider: Treating Provider/Extender: Madelynn Done in Treatment: 7 Information Obtained from: Patient Chief Complaint 01/15/2023; left foot wound Electronic Signature(s) Signed: 03/20/2023 3:35:26 PM By: Kalman Shan DO Entered By: Kalman Shan on 03/20/2023 14:08:13 -------------------------------------------------------------------------------- Debridement Details Patient Name: Date of Service: Cherrie Gauze [place], [name] 03/20/2023 1:30 PM Medical Record Number: [id number] Patient Account Number: [id number] Date of Birth/Sex: Treating RN: Jan 06, 1971 (52 y.o. Lorette Ang, Meta.Reding Primary Care Provider: Asencion Noble Other Clinician: Referring Provider: Treating Provider/Extender: Madelynn Done in Treatment: 7 Debridement Performed for Assessment: Wound #3 Left,Plantar Foot Performed By: Physician Kalman Shan, DO Debridement Type: Debridement Severity of Tissue Pre Debridement: Fat layer exposed Level of Consciousness (Pre-procedure): Awake and Alert Pre-procedure Verification/Time Out Yes - 13:45 Taken: Start Time: 13:46 Pain Control: Lidocaine 4% T opical Solution T Area Debrided (L x W): otal 1.5 (cm) x 2 (cm) = 3 (cm) Tissue and other material debrided: Viable, Non-Viable, Callus, Slough, Subcutaneous, Skin: Dermis , Skin: Epidermis, Slough Level: Skin/Subcutaneous Tissue Debridement Description: Excisional Instrument: Curette Bleeding: Minimum End Time: 13:52 Procedural Pain: 0 Post Procedural Pain: 0 Response to  Treatment: Procedure was tolerated well Level of Consciousness (Post- Awake and Alert procedure): Post Debridement Measurements of Total Wound Length: (cm) 1.3 Width: (cm) 0.7 Depth: (cm) 0.3 Volume: (cm) 0.214 Character of Wound/Ulcer Post Debridement: Improved Severity of Tissue Post Debridement: Fat layer exposed Post Procedure Diagnosis Same as Pre-procedure Electronic Signature(s) Signed: 03/20/2023 3:35:26 PM By: Kalman Shan DO Signed: 03/20/2023 5:21:08 PM By: Deon Pilling RN, BSN Entered By: Deon Pilling on 03/20/2023 13:52:47 [name] ([id number]) [id number] Page 2 of 10 -------------------------------------------------------------------------------- HPI Details Patient Name: Date of Service: Cherrie Gauze [place], [place] RO LD 03/20/2023 1:30 PM Medical Record Number: [id number] Patient Account Number: [id number] Date of Birth/Sex: Treating RN: 02/12/1971 (52 y.o. M) Primary Care Provider: Asencion Noble Other Clinician: Referring Provider: Treating Provider/Extender: Madelynn Done in Treatment: 7 History of Present Illness HPI Description: ADMISSION 01/11/2020 This is a 52 year old man with uncontrolled type 2 diabetes with a recent hemoglobin A1c earlier this year of 13.4. He is on insulin and glipizide. He does not take his blood sugars at home. He does have a follow-up with primary care later this month I believe on January 27. He tells [place] that roughly a month ago he was walking with a shoe with a hole in his foot. He took the shoe off and there was an open wound at roughly the left fourth met head. This has significant undermining and raised edges. He has not noticed any purulence he does not feel unwell. More recently he was taking skin off the bottom of his foot and has a superficial area on the left fifth met head. He has not been offloading this. The patient was in the ER on 12/20. They gave him Bactroban which  she has been using on the wound and 10 days worth of doxycycline. No x-rays were done. He has not had vascular studies. He is also been using hydrogen peroxide. Past medical history type 2 diabetes uncontrolled, chronic systolic heart failure, coronary artery disease with a history of congestive heart failure with stents.  Hypertension hyperlipidemia and chronic renal insufficiency ABI in this clinic was 1.14 on the left. 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 He is uncertain whether he would be able to work if we put him on some form of restriction 1/19; he is generally doing quite well. Using silver alginate on the wounds. Things actually look better. He has a forefoot offloading boot which she seems to be compliant about. He has support at work to stay off his foot is much as possible which is gratifying. Culture I did last week showed a few Enterococcus faecalis. I am going to put him on Augmentin. I talked about ordering an x-ray in my note last week but that does not seem to have happened. We will review reorder the x-ray this week. 1/26; x-ray reordered last week was negative for osteomyelitis. We are using silver alginate on the wound on the third and fifth met heads on the left. He is using a Darco forefoot off loader 2/2; the area on the fifth met head is closed. Third met head is still open with tunneling depth and thick callus. 2/9; the area on the fifth met head remains closed however the third met head again has a small open area on presentation with tunneling in depth and surrounded by thick callus. This looks like a pressure issue. We have been using silver alginate 2/16; the area of the fifth metatarsal head remains closed however the area over the third metatarsal head again is a small open area but with some depth. I do not think this is changed much since last week. He is using Hydrofera Blue with forefoot off loader. He is not able to use a total  contact cast on the left leg because he needs his left leg at work [employer]]. Fortunately the wound does not look infected. I changed him to endoform today 2/26; the area of the fifth metatarsal head remains closed. The area of the third metatarsal head has an even smaller opening this time. I used endoform on this this looks improved. He is offloading this is much as he can and a forefoot off loader on the left. He cannot have a total contact cast because of work responsibilities 3/5; the area on the fifth metatarsal head remains closed the area on the third metatarsal head is also closed on the left foot. 01/15/2023 Mr. 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 He has been following for podiatry for several months for this issue. He is currently using wet-to-dry saline dressings. He currently denies signs of infection. Progresse note from 1/17; Patient presents with a 81-month history of nonhealing ulcer to the left foot secondary to diabetes and inability to offload well the area. He has had multiple debridements in the past to his feet bilaterally. He has had resection of the fourth left met head in the fifth toe Secondary to osteomyelitis. We discussed the importance of glycemic control for wound healing. Due to his blood glucose levels being elevated he is at high risk for infection and thus further amputation. He expressed understanding. He states he is supposed to be referred to an endocrinologist at [facility] however the  referral fell through. Unclear what happened. Offered a referral to endocrinology at [facility]. Patient was agreeable.Furthermore  we discussed the importance of aggressive offloading for his wound healing. This will be the most difficult part of the treatment plan for the patient to do. We discussed a total contact cast however he has declined that at this time. 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 He is currently using a surgical shoe with a peg assist. It does not fit well so we will give him a new one today. I do not think this is enough offloading. If he is not able to offload this area he will likely end up with a BKA. He is well aware of this. For now I recommended Medihoney and Hydrofera Blue for dressing changes. He will follow-up in our [place] office Since this is closer for him. 1/26; patient presents for follow-up. Last clinic visit I had seen him in [place] and we transferred him to [place] since this is a closer location for him. Progress note above from that visit. Over the past week He has been using Medihoney and Hydrofera Blue to the wound bed. He has been using his surgical shoe with peg assist. He has no issues or complaints today. We discussed doing the total contact cast and he was agreeable to having this placed at next clinic visit. 2/2; patient presents for follow-up. He has been using Vashe wet-to-dry dressings. Plan is for the total contact cast today. 2/5; patient presents for follow-up. The cast was placed 3 days ago. He tolerated this well however had a lot of drainage. He currently denies signs of infection. 2/9; patient presents for follow-up. At last clinic visit we held off on replacing the cast due to drainage. He has been using Vashe wet-to-dry dressings and he is currently taking the oral antibiotics prescribed without issues. He declines a total contact cast today. We ran insurance verification for skin substitute and due to cost patient declines having this placed. 2/16; patient presents for follow-up.  He has decided not to follow-up with podiatry. He continues to decline the total contact cast. He has been using Vashe wet- to-dry dressings. He has not been wearing his surgical shoe with peg assist. He currently denies signs of infection. 3/1; patient presents for follow-up. He has been doing Vashe wet-to-dry dressings. He has a surgical shoe with peg assist. We discussed potentially doing a skin substitute for which she has been approved for by insurance. He knows the out-of-pocket cost of this and would still like to proceed with having this placed today. He denies the current total contact cast. 3/8; patient presents for follow-up. He states that the skin substitute came off after a few days and he has been using Vashe wet-to-dry dressings. He has KANARD, FORS ([id number]) [id number] Page 3 of 10 mild odor to the wound bed on exam. 3/15; patient presents for follow-up. He has been using Vashe wet-to-dry dressings and started his course of Augmentin. He reports improvement in wound healing. He has no issues or complaints today. 3/21; patient presents for follow-up. He continues to use Vashe wet-to-dry dressings. He has no issues or complaints today. Electronic Signature(s) Signed: 03/20/2023 3:35:26 PM By: Kalman Shan DO Entered By: Kalman Shan on 03/20/2023 14:08:38 -------------------------------------------------------------------------------- Physical Exam Details Patient Name: Date of Service: Cherrie Gauze [place], [name] 03/20/2023 1:30 PM Medical Record Number: [id number] Patient  Account Number: [id number] Date of Birth/Sex: Treating RN: May 28, 1971 (52 y.o. M) Primary Care Provider: Asencion Noble Other Clinician: Referring Provider: Treating Provider/Extender: Madelynn Done in Treatment: 7 Constitutional respirations regular, non-labored and within target range for patient.. Cardiovascular 2+ dorsalis pedis/posterior  tibialis pulses. Psychiatric pleasant and cooperative. Notes Left foot: T the lateral aspect, fifth met head there is an open wound with granulation tissue And nonviable tissue. No purulent drainage. No odor on exam. No o obvious increase in warmth or erythema. Electronic Signature(s) Signed: 03/20/2023 3:35:26 PM By: Kalman Shan DO Entered By: Kalman Shan on 03/20/2023 14:09:05 -------------------------------------------------------------------------------- Physician Orders Details Patient Name: Date of Service: Cherrie Gauze [place], [name] 03/20/2023 1:30 PM Medical Record Number: [id number] Patient Account Number: [id number] Date of Birth/Sex: Treating RN: Feb 21, 1971 (52 y.o. [name] Primary Care Provider: Asencion Noble Other Clinician: Referring Provider: Treating Provider/Extender: Madelynn Done in Treatment: 7 Verbal / Phone Orders: No Diagnosis Coding ICD-10 Coding Code Description [phone] 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.621 Type 2 diabetes mellitus with foot ulcer Z89.422 Acquired absence of other left toe(s) Follow-up Appointments ppointment in 1 week. - Dr. Heber [place] Room 9 245pm 03/27/2023 Return A Return appointment in 3 weeks. - Dr. Heber [place] Room 9 04/10/2023 245pm Cellular or Tissue Based Products Cellular or Tissue Based Product Type: - apligraf and grafix 50% coinsurance 02/28/23-apply 1st Grafix 03/07/2023 HOLD THIS WEEK. Bathing/ Shower/ Hygiene May shower with protection but do not get wound dressing(s) wet. Protect dressing(s) with water repellant cover (for example, large plastic bag) or a cast cover and may then take shower. RAGAN, SLADKY ([id number]) [id number] Page 4 of 10 Off-Loading Other: - keep pressure off left lateral foot; use peg assist as insert in surgical shoe Additional Orders / Instructions Other: - ice, elevation and ace wrap to  foot and ankle to aid in the right medial foot area. Wound Treatment Wound #3 - Foot Wound Laterality: Plantar, Left Cleanser: Wound Cleanser 1 x Per [id number] Days Discharge Instructions: Cleanse the wound with wound cleanser prior to applying a clean dressing using gauze sponges, not tissue or cotton balls. Prim Dressing: Vashe 1 x Per [id number] Days ary Discharge Instructions: wet to dry gauze. Prim Dressing: 2x2 gauze 1 x Per Day/15 Days ary Discharge Instructions: sock gauze with vashe. Secondary Dressing: ABD Pad, 5x9 1 x Per Day/15 Days Discharge Instructions: ABD pad donut. Secured With: [employer], 4.5x3.1 (in/yd) 1 x Per Day/15 Days Discharge Instructions: Secure with Kerlix as directed. Secured With: 60M Medipore H Soft Cloth Surgical T ape, 4 x 10 (in/yd) 1 x Per Day/15 Days Discharge Instructions: Secure with tape as directed. Electronic Signature(s) Signed: 03/20/2023 3:35:26 PM By: Kalman Shan DO Entered By: Kalman Shan on 03/20/2023 14:28:00 -------------------------------------------------------------------------------- Problem List Details Patient Name: Date of Service: Cherrie Gauze [place], [name] 03/20/2023 1:30 PM Medical Record Number: [id number] Patient Account Number: [id number] Date of Birth/Sex: Treating RN: November 06, 1971 (52 y.o. [name] Primary Care Provider: Asencion Noble Other Clinician: Referring Provider: Treating Provider/Extender: Madelynn Done in Treatment: 7 Active Problems ICD-10 Encounter Code Description Active Date MDM Diagnosis [phone] Non-pressure chronic ulcer of other part of left foot with fat layer exposed 01/24/2023 No Yes E11.621 Type 2 diabetes mellitus with foot ulcer 01/24/2023 No Yes Z89.422 Acquired absence of other left toe(s) 01/24/2023 No Yes Inactive Problems Resolved Problems Electronic Signature(s) Signed: 03/20/2023 3:35:26 PM By: Kalman Shan  DO Entered By: Kalman Shan on  03/20/2023 14:07:42 [name] ([id number]) [id number] Page 5 of 10 -------------------------------------------------------------------------------- Progress Note Details Patient Name: Date of Service: Cherrie Gauze [place], [place] RO LD 03/20/2023 1:30 PM Medical Record Number: [id number] Patient Account Number: [id number] Date of Birth/Sex: Treating RN: 02-11-1971 (52 y.o. M) Primary Care Provider: Asencion Noble Other Clinician: Referring Provider: Treating Provider/Extender: Madelynn Done in Treatment: 7 Subjective Chief Complaint Information obtained from Patient 01/15/2023; left foot wound History of Present Illness (HPI) ADMISSION 01/11/2020 This is a 52 year old man with uncontrolled type 2 diabetes with a recent hemoglobin A1c earlier this year of 13.4. He is on insulin and glipizide. He does not take his blood sugars at home. He does have a follow-up with primary care later this month I believe on January 27. He tells [place] that roughly a month ago he was walking with a shoe with a hole in his foot. He took the shoe off and there was an open wound at roughly the left fourth met head. This has significant undermining and raised edges. He has not noticed any purulence he does not feel unwell. More recently he was taking skin off the bottom of his foot and has a superficial area on the left fifth met head. He has not been offloading this. The patient was in the ER on 12/20. They gave him Bactroban which she has been using on the wound and 10 days worth of doxycycline. No x-rays were done. He has not had vascular studies. He is also been using hydrogen peroxide. Past medical history type 2 diabetes uncontrolled, chronic systolic heart failure, coronary artery disease with a history of congestive heart failure with stents. Hypertension hyperlipidemia and chronic renal insufficiency ABI in this clinic was 1.14 on the left. Socially the patient  works in [employer]. He is on his feet a lot. He is uncertain whether he would be able to work if we put him on some form of restriction 1/19; he is generally doing quite well. Using silver alginate on the wounds. Things actually look better. He has a forefoot offloading boot which she seems to be compliant about. He has support at work to stay off his foot is much as possible which is gratifying. Culture I did last week showed a few Enterococcus faecalis. I am going to put him on Augmentin. I talked about ordering an x-ray in my note last week but that does not seem to have happened. We will review reorder the x-ray this week. 1/26; x-ray reordered last week was negative for osteomyelitis. We are using silver alginate on the wound on the third and fifth met heads on the left. He is using a Darco forefoot off loader 2/2; the area on the fifth met head is closed. Third met head is still open with tunneling depth and thick callus. 2/9; the area on the fifth met head remains closed however the third met head again has a small open area on presentation with tunneling in depth and surrounded by thick callus. This looks like a pressure issue. We have been using silver alginate 2/16; the area of the fifth metatarsal head remains closed however the area over the third metatarsal head again is a small open area but with some depth. I do not think this is changed much since last week. He is using Hydrofera Blue with forefoot off loader. He is not able to use a total contact cast on the left leg because he needs his left  leg at work [employer]]. Fortunately the wound does not look infected. I changed him to endoform today 2/26; the area of the fifth metatarsal head remains closed. The area of the third metatarsal head has an even smaller opening this time. I used endoform on this this looks improved. He is offloading this is much as he can and a forefoot off loader on the left. He cannot have a total  contact cast because of work responsibilities 3/5; the area on the fifth metatarsal head remains closed the area on the third metatarsal head is also closed on the left foot. 01/15/2023 Mr. 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 He has been following for podiatry for several months for this issue. He is currently using wet-to-dry saline dressings. He currently denies signs of infection. Progresse note from 1/17; Patient presents with a 44-month history of nonhealing ulcer to the left foot secondary to diabetes and inability to offload well the area. He has had multiple debridements in the past to his feet bilaterally. He has had resection of the fourth left met head in the fifth toe Secondary to osteomyelitis. We discussed the importance of glycemic control for wound healing. Due to his blood glucose levels being elevated he is at high risk for infection and thus further amputation. He expressed understanding. He states he is supposed to be referred to an endocrinologist at [facility] however the referral fell through. Unclear what happened. Offered a referral to endocrinology at [facility]. Patient was agreeable.Furthermore we discussed the importance of aggressive offloading for his wound healing. This will be the most difficult part of the treatment plan for the patient to do. We discussed a total contact cast however he has declined that at this time. 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 He is currently using a surgical shoe with a peg assist. It does not fit well so we will give him  a new one today. I do not think this is enough offloading. If he is not able to offload this area he will likely end up with a BKA. He is well aware of this. For now I recommended Medihoney and Hydrofera Blue for dressing changes. He will follow-up in our [place] office Since this is closer for him. 1/26; patient presents for follow-up. Last clinic visit I had seen him in [place] and we transferred him to [place] since this is a closer location for him. Progress note above from that visit. Over the past week He has been using Medihoney and Hydrofera Blue to the wound bed. He has been using his surgical shoe with peg assist. He has no issues or complaints today. We discussed doing the total contact cast and he was agreeable to having this placed at next clinic visit. 2/2; patient presents for follow-up. He has been using Vashe wet-to-dry dressings. Plan is for the total contact cast today. 2/5; patient presents for follow-up. The cast was placed 3 days ago. He tolerated this well however had a lot of drainage. He currently denies signs of infection. 2/9; patient presents for follow-up. At last clinic visit  we held off on replacing the cast due to drainage. He has been using Vashe wet-to-dry dressings and he is currently taking the oral antibiotics prescribed without issues. He declines a total contact cast today. We ran insurance verification for skin substitute and due to cost patient declines having this placed. 2/16; patient presents for follow-up. He has decided not to follow-up with podiatry. He continues to decline the total contact cast. He has been using Vashe wet- to-dry dressings. He has not been wearing his surgical shoe with peg assist. He currently denies signs of infection. 3/1; patient presents for follow-up. He has been doing Vashe wet-to-dry dressings. He has a surgical shoe with peg assist. We discussed potentially doing a skin substitute for which she has been approved for  by insurance. He knows the out-of-pocket cost of this and would still like to proceed with having this placed today. He denies the current total contact cast. [name], REASONER ([id number]) [id number] Page 6 of 10 3/8; patient presents for follow-up. He states that the skin substitute came off after a few days and he has been using Vashe wet-to-dry dressings. He has mild odor to the wound bed on exam. 3/15; patient presents for follow-up. He has been using Vashe wet-to-dry dressings and started his course of Augmentin. He reports improvement in wound healing. He has no issues or complaints today. 3/21; patient presents for follow-up. He continues to use Vashe wet-to-dry dressings. He has no issues or complaints today. Patient History Information obtained from Patient. Family History Heart Disease - Mother,Father, No family history of Cancer, Diabetes, Hereditary Spherocytosis, Hypertension, Kidney Disease, Lung Disease, Seizures, Stroke, Thyroid Problems, Tuberculosis. Social History Never smoker, Marital Status - Single, Alcohol Use - Rarely, Drug Use - No History, Caffeine Use - Rarely. 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 4 Denies history of Angina, Arrhythmia, Deep Vein Thrombosis, Hypotension, Peripheral Arterial Disease, Peripheral Venous Disease, Phlebitis, Vasculitis Gastrointestinal Denies history of Cirrhosis ,  Colitis, Crohnoos, Hepatitis A, Hepatitis B, Hepatitis C Endocrine Patient has history of Type II Diabetes Genitourinary Denies history of End Stage Renal Disease Immunological Denies history of Lupus Erythematosus, Raynaudoos, Scleroderma Integumentary (Skin) Denies history of History of Burn Musculoskeletal Denies history of Gout, Rheumatoid Arthritis, Osteoarthritis, Osteomyelitis Neurologic Patient has history of Neuropathy Denies history of Dementia, Quadriplegia, Paraplegia, Seizure Disorder Oncologic Denies history of Received Chemotherapy, Received Radiation Psychiatric Denies history of Anorexia/bulimia, Confinement Anxiety Hospitalization/Surgery History - Heart Cath in 2018. Medical A Surgical History Notes nd Cardiovascular Ischemic Cardiomyopathy Genitourinary Renal Insufficiency Objective Constitutional respirations regular, non-labored and within target range for patient.. Vitals Time Taken: 1:22 PM, Height: 78 in, Weight: 275 lbs, BMI: 31.8, Temperature: 98.2 F, Pulse: 90 bpm, Respiratory Rate: 18 breaths/min, Blood Pressure: 100/68 mmHg. Cardiovascular 2+ dorsalis pedis/posterior tibialis pulses. Psychiatric pleasant and cooperative. General Notes: Left foot: T the lateral aspect, fifth met head there is an open wound with granulation tissue And nonviable tissue. No purulent drainage. No o odor on exam. No obvious increase in warmth or erythema. MAXIMINO, ALCORN ([id number]) [id number] Page 7 of 10 Integumentary (Hair, Skin) Wound #3 status is Open. Original cause of wound was Gradually  Appeared. The date acquired was: 10/01/2022. The wound has been in treatment 7 weeks. The wound is located on the [place]. The wound measures 1.3cm length x 0.7cm width x 0.3cm depth; 0.715cm^2 area and 0.214cm^3 volume. There is Fat Layer (Subcutaneous Tissue) exposed. There is no tunneling or undermining noted. There is a medium  amount of serosanguineous drainage noted. The wound margin is thickened. There is large (67-100%) red granulation within the wound bed. There is no necrotic tissue within the wound bed. The periwound skin appearance exhibited: Callus. The periwound skin appearance did not exhibit: Crepitus, Excoriation, Induration, Rash, Scarring, Dry/Scaly, Maceration, Atrophie Blanche, Cyanosis, Ecchymosis, Hemosiderin Staining, Mottled, Pallor, Rubor, Erythema. Periwound temperature was noted as No Abnormality. 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 I debrided nonviable tissue. The periwound is macerated and I recommended using a smaller piece of gauze for the Vashe wet-to-dry dressing. I recommended continuing with his offloading shoe. Follow-up in 1 week. Procedures Wound #3 Pre-procedure diagnosis of Wound #3 is a Diabetic Wound/Ulcer of the Lower Extremity located on the Left,Plantar Foot .Severity of Tissue Pre Debridement is: Fat layer exposed. There was a Excisional Skin/Subcutaneous Tissue Debridement with a total area of 3 sq cm performed by Kalman Shan, DO. With the following instrument(s): Curette to remove Viable and Non-Viable tissue/material. Material removed includes Callus, Subcutaneous Tissue, Slough, Skin: Dermis, and Skin: Epidermis after achieving pain control using Lidocaine 4% Topical Solution. A time out was conducted at 13:45, prior to the start of the procedure. A Minimum amount of bleeding was controlled with N/A. The procedure was tolerated well with a pain level of 0 throughout and a pain level of 0 following the procedure. Post Debridement Measurements: 1.3cm length x 0.7cm width x 0.3cm depth; 0.214cm^3 volume. Character of Wound/Ulcer Post Debridement is  improved. Severity of Tissue Post Debridement is: Fat layer exposed. Post procedure Diagnosis Wound #3: Same as Pre-Procedure Plan Follow-up Appointments: Return Appointment in 1 week. - Dr. Heber [place] Room 9 245pm 03/27/2023 Return appointment in 3 weeks. - Dr. Heber [place] Room 9 04/10/2023 245pm Cellular or Tissue Based Products: Cellular or Tissue Based Product Type: - apligraf and grafix 50% coinsurance 02/28/23-apply 1st Grafix 03/07/2023 HOLD THIS WEEK. Bathing/ Shower/ Hygiene: May shower with protection but do not get wound dressing(s) wet. Protect dressing(s) with water repellant cover (for example, large plastic bag) or a cast cover and may then take shower. Off-Loading: Other: - keep pressure off left lateral foot; use peg assist as insert in surgical shoe Additional Orders / Instructions: Other: - ice, elevation and ace wrap to foot and ankle to aid in the right medial foot area. WOUND #3: - Foot Wound Laterality: Plantar, Left Cleanser: Wound Cleanser 1 x Per Day/15 Days Discharge Instructions: Cleanse the wound with wound cleanser prior to applying a clean dressing using gauze sponges, not tissue or cotton balls. Prim Dressing: Vashe 1 x Per [id number] Days ary Discharge Instructions: wet to dry gauze. Prim Dressing: 2x2 gauze 1 x Per Day/15 Days ary Discharge Instructions: sock gauze with vashe. Secondary Dressing: ABD Pad, 5x9 1 x Per Day/15 Days Discharge Instructions: ABD pad donut. Secured With: [employer], 4.5x3.1 (in/yd) 1 x Per Day/15 Days Discharge Instructions: Secure with Kerlix as directed. Secured With: 65M Medipore H Soft Cloth Surgical T ape, 4 x 10 (  in/yd) 1 x Per Day/15 Days Discharge Instructions: Secure with tape as directed. 1. In office sharp debridement 2. Vashe wet-to-dry dressings 3. Aggressive offloadingoosurgical shoe with peg assist Electronic Signature(s) Signed: 03/20/2023 3:35:26 PM By: Kalman Shan DO Entered By: Kalman Shan on  03/20/2023 14:41:59 [name] ([id number]) [id number] Page 8 of 10 -------------------------------------------------------------------------------- HxROS Details Patient Name: Date of Service: Cherrie Gauze [place], [place] RO LD 03/20/2023 1:30 PM Medical Record Number: [id number] Patient Account Number: [id number] Date of Birth/Sex: Treating RN: 1971/06/07 (52 y.o. M) Primary Care Provider: Asencion Noble Other Clinician: Referring Provider: Treating Provider/Extender: Madelynn Done in Treatment: 7 Information Obtained From Patient Eyes Medical History: Negative for: Cataracts; Glaucoma; Optic Neuritis Ear/Nose/Mouth/Throat Medical History: Positive for: Chronic sinus problems/congestion Negative for: Middle ear problems Hematologic/Lymphatic Medical History: Negative for: Anemia; Hemophilia; Human Immunodeficiency Virus; Lymphedema; Sickle Cell Disease Respiratory Medical History: Negative for: Aspiration; Asthma; Chronic Obstructive Pulmonary Disease (COPD); Pneumothorax; Sleep Apnea; Tuberculosis Cardiovascular Medical History: Positive for: Congestive Heart Failure; Coronary Artery Disease; Hypertension; Myocardial Infarction - age 31 Negative for: Angina; Arrhythmia; Deep Vein Thrombosis; Hypotension; Peripheral Arterial Disease; Peripheral Venous Disease; Phlebitis; Vasculitis Past Medical History Notes: Ischemic Cardiomyopathy Gastrointestinal Medical History: Negative for: Cirrhosis ; Colitis; Crohns; Hepatitis A; Hepatitis B; Hepatitis C Endocrine Medical History: Positive for: Type II Diabetes Time with diabetes: ten years Treated with: Insulin Blood sugar tested every day: No Genitourinary Medical History: Negative for: End Stage Renal Disease Past Medical History Notes: Renal Insufficiency Immunological Medical History: Negative for: Lupus Erythematosus; Raynauds; Scleroderma Integumentary (Skin) Medical  History: Negative for: History of Burn Musculoskeletal Medical History: Negative for: Gout; Rheumatoid Arthritis; Osteoarthritis; Osteomyelitis AURIEL, IDLER ([id number]) [id number] Page 9 of 10 Neurologic Medical History: Positive for: Neuropathy Negative for: Dementia; Quadriplegia; Paraplegia; Seizure Disorder Oncologic Medical History: Negative for: Received Chemotherapy; Received Radiation Psychiatric Medical History: Negative for: Anorexia/bulimia; Confinement Anxiety HBO Extended History Items Ear/Nose/Mouth/Throat: Chronic sinus problems/congestion Immunizations Pneumococcal Vaccine: Received Pneumococcal Vaccination: No Implantable Devices None Hospitalization / Surgery History Type of Hospitalization/Surgery Heart Cath in 2018 Family and Social History Cancer: No; Diabetes: No; Heart Disease: Yes - Mother,Father; Hereditary Spherocytosis: No; Hypertension: No; Kidney Disease: No; Lung Disease: No; Seizures: No; Stroke: No; Thyroid Problems: No; Tuberculosis: No; Never smoker; Marital Status - Single; Alcohol Use: Rarely; Drug Use: No History; Caffeine Use: Rarely; Financial Concerns: No; Food, Clothing or Shelter Needs: No; Support System Lacking: No; Transportation Concerns: No Electronic Signature(s) Signed: 03/20/2023 3:35:26 PM By: Kalman Shan DO Entered By: Kalman Shan on 03/20/2023 14:08:44 -------------------------------------------------------------------------------- SuperBill Details Patient Name: Date of Service: Cherrie Gauze [place], [name] 03/20/2023 Medical Record Number: [id number] Patient Account Number: [id number] Date of Birth/Sex: Treating RN: 1971/01/12 (52 y.o. Lorette Ang, [place] Primary Care Provider: Asencion Noble Other Clinician: Referring Provider: Treating Provider/Extender: Madelynn Done in Treatment: 7 Diagnosis Coding ICD-10 Codes Code Description [phone] Non-pressure chronic  ulcer of other part of left foot with fat layer exposed E11.621 Type 2 diabetes mellitus with foot ulcer Z89.422 Acquired absence of other left toe(s) Facility Procedures : CPT4 Code: [id number] Description: [id number] - DEB SUBQ TISSUE 20 SQ CM/< ICD-10 Diagnosis Description L97.522 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.621 Type 2 diabetes mellitus with foot ulcer Modifier: Quantity: 1 Physician Procedures : CPT4 Code Description Modifier LINELL, RUDDOCK ([id number]) [id number] - WC PHYS SUBQ TISS 20 SQ CM 1 ICD-10 Diagnosis Description L97.522 Non-pressure chronic ulcer of other part of left foot  with fat layer  exposed E11.621 Type 2 diabetes mellitus with foot ulcer Quantity: .pdf Page 10 of 10 Electronic Signature(s) Signed: 03/20/2023 3:35:26 PM By: Kalman Shan DO Entered By: Kalman Shan on 03/20/2023 14:42:16

## 2023-03-24 ENCOUNTER — Other Ambulatory Visit: Payer: Self-pay | Admitting: Critical Care Medicine

## 2023-03-24 ENCOUNTER — Other Ambulatory Visit: Payer: Self-pay

## 2023-03-24 MED ORDER — OMEPRAZOLE 20 MG PO CPDR
20.0000 mg | DELAYED_RELEASE_CAPSULE | Freq: Every day | ORAL | 0 refills | Status: DC
  Filled 2023-03-24: qty 30, 30d supply, fill #0

## 2023-03-24 NOTE — Progress Notes (Signed)
DWAUN, SOSEBEE ([id number]) [id number] Page 1 of 7 Visit Report for 03/20/2023 Arrival Information Details Patient Name: Date of Service: [name] [place], [place] RO LD 03/20/2023 1:30 PM Medical Record Number: [id number] Patient Account Number: [id number] Date of Birth/Sex: Treating RN: [birth year] (52 y.o. M) Primary Care Arthi Mcdonald: Asencion Noble Other Clinician: Referring Zarya Lasseigne: Treating Deyja Sochacki/Extender: Madelynn Done in Treatment: 7 Visit Information History Since Last Visit Added or deleted any medications: No Patient Arrived: Ambulatory Any new allergies or adverse reactions: No Arrival Time: 13:19 Had a fall or experienced change in No Accompanied By: self activities of daily living that may affect Transfer Assistance: None risk of falls: Patient Identification Verified: Yes Signs or symptoms of abuse/neglect since last visito No Secondary Verification Process Completed: Yes Hospitalized since last visit: No Patient Requires Transmission-Based Precautions: No Implantable device outside of the clinic excluding No Patient Has Alerts: Yes cellular tissue based products placed in the center Patient Alerts: 08/2021 ABI:1.2 TBI 0.79 since last visit: Has Dressing in Place as Prescribed: Yes Pain Present Now: No Electronic Signature(s) Signed: 03/24/2023 4:16:21 PM By: Erenest Blank Entered By: Erenest Blank on 03/20/2023 13:20:19 -------------------------------------------------------------------------------- Encounter Discharge Information Details Patient Name: Date of Service: [name], [name] 03/20/2023 1:30 PM Medical Record Number: [id number] Patient Account Number: [id number] Date of Birth/Sex: Treating RN: 1971/11/27 (52 y.o. [name] Primary Care Brason Berthelot: Asencion Noble Other Clinician: Referring Xavius Spadafore: Treating Madgie Dhaliwal/Extender: Madelynn Done in Treatment: 7 Encounter  Discharge Information Items Post Procedure Vitals Discharge Condition: Stable Temperature (F): 98.2 Ambulatory Status: Ambulatory Pulse (bpm): 90 Discharge Destination: Home Respiratory Rate (breaths/min): 18 Transportation: Private Auto Blood Pressure (mmHg): 100/68 Accompanied By: self Schedule Follow-up Appointment: Yes Clinical Summary of Care: Electronic Signature(s) Signed: 03/20/2023 5:21:08 PM By: Deon Pilling RN, BSN Entered By: Deon Pilling on 03/20/2023 13:57:09 -------------------------------------------------------------------------------- Lower Extremity Assessment Details Patient Name: Date of Service: [name] 03/20/2023 1:30 PM Medical Record Number: [id number] Patient Account Number: [id number] Date of Birth/Sex: Treating RN: [birth year] (52 y.o. M) Primary Care [name]: Asencion Noble Other Clinician: Referring Shakela Donati: Treating Jahsir Rama/Extender: Madelynn Done in Treatment: 7 Edema Assessment Assessed: Shirlyn Goltz: No] Patrice Paradise: No] F[LeftHAROLD, BEEKS [id number] [Right: [id number] Page 2 of 7] Edema: [Left: Ye] [Right: s] Calf Left: Right: Point of Measurement: 43 cm From Medial Instep 37 cm Ankle Left: Right: Point of Measurement: 9 cm From Medial Instep 22 cm Electronic Signature(s) Signed: 03/24/2023 4:16:21 PM By: Erenest Blank Entered By: Erenest Blank on 03/20/2023 13:25:27 -------------------------------------------------------------------------------- Multi Wound Chart Details Patient Name: Date of Service: [name], [name] 03/20/2023 1:30 PM Medical Record Number: [id number] Patient Account Number: [id number] Date of Birth/Sex: Treating RN: November 25, 1971 (52 y.o. M) Primary Care [name]: Asencion Noble Other Clinician: Referring Syncere Eble: Treating Emila Steinhauser/Extender: Madelynn Done in Treatment: 7 Vital Signs Height(in): 78 Pulse(bpm):  67 Weight(lbs): 275 Blood Pressure(mmHg): 100/68 Body Mass Index(BMI): 31.8 Temperature(F): 98.2 Respiratory Rate(breaths/min): 18 [3:Photos:] [N/A:N/A] Left, Plantar Foot N/A N/A Wound Location: Gradually Appeared N/A N/A Wounding Event: Diabetic Wound/Ulcer of the Lower N/A N/A Primary Etiology: Extremity Chronic sinus problems/congestion, N/A N/A Comorbid History: Congestive Heart Failure, Coronary Artery Disease, Hypertension, Myocardial Infarction, Type II Diabetes, Neuropathy 10/01/2022 N/A N/A Date Acquired: 7 N/A N/A Weeks of Treatment: Open N/A N/A Wound Status: No N/A N/A Wound Recurrence: Yes N/A N/A Pending [name] on Presentation: 1.3x0.7x0.3 N/A N/A Measurements L x W x D (cm) 0.715 N/A N/A  A (cm) : rea 0.214 N/A N/A Volume (cm) : 81.80% N/A N/A % Reduction in A rea: 72.70% N/A N/A % Reduction in Volume: Grade 1 N/A N/A Classification: Medium N/A N/A Exudate A mount: Serosanguineous N/A N/A Exudate Type: red, brown N/A N/A Exudate Color: Thickened N/A N/A Wound Margin: Large (67-100%) N/A N/A Granulation A mount: Red N/A N/A Granulation Quality: None Present (0%) N/A N/A Necrotic A mount: Fat Layer (Subcutaneous Tissue): Yes N/A N/A Exposed Structures: Fascia: No Tendon: No Muscle: No Joint: No RANEY, SCHUBBE ([id number]) [id number] Page 3 of 7 Bone: No Small (1-33%) N/A N/A Epithelialization: Debridement - Excisional N/A N/A Debridement: 13:45 N/A N/A Pre-procedure Verification/Time Out Taken: Lidocaine 4% Topical Solution N/A N/A Pain Control: Callus, Subcutaneous, Slough N/A N/A Tissue Debrided: Skin/Subcutaneous Tissue N/A N/A Level: 3 N/A N/A Debridement A (sq cm): rea Curette N/A N/A Instrument: Minimum N/A N/A Bleeding: 0 N/A N/A Procedural Pain: 0 N/A N/A Post Procedural Pain: Procedure was tolerated well N/A N/A Debridement Treatment Response: 1.3x0.7x0.3 N/A N/A Post  Debridement Measurements L x W x D (cm) 0.214 N/A N/A Post Debridement Volume: (cm) Callus: Yes N/A N/A Periwound Skin Texture: Excoriation: No Induration: No Crepitus: No Rash: No Scarring: No Maceration: No N/A N/A Periwound Skin Moisture: Dry/Scaly: No Atrophie Blanche: No N/A N/A Periwound Skin Color: Cyanosis: No Ecchymosis: No Erythema: No Hemosiderin Staining: No Mottled: No Pallor: No Rubor: No No Abnormality N/A N/A Temperature: Debridement N/A N/A Procedures Performed: Treatment Notes Wound #3 (Foot) Wound Laterality: Plantar, Left Cleanser Wound Cleanser Discharge Instruction: Cleanse the wound with wound cleanser prior to applying a clean dressing using [name] sponges, not tissue or cotton balls. Peri-Wound Care Topical Primary Dressing Vashe Discharge Instruction: wet to dry [name]. 2x2 [name] Discharge Instruction: sock [name] with vashe. Secondary Dressing ABD Pad, 5x9 Discharge Instruction: ABD pad donut. Secured With [employer], 4.5x3.1 (in/yd) Discharge Instruction: Secure with Kerlix as directed. 26M Medipore H Soft Cloth Surgical T ape, 4 x 10 (in/yd) Discharge Instruction: Secure with tape as directed. Compression Wrap Compression Stockings Add-Ons Electronic Signature(s) Signed: 03/20/2023 3:35:26 PM By: Kalman Shan DO Entered By: Kalman Shan on 03/20/2023 14:07:48 Rembrandt Details -------------------------------------------------------------------------------- [name] ([id number]) [id number] Page 4 of 7 Patient Name: Date of Service: [name] [place], [place] RO LD 03/20/2023 1:30 PM Medical Record Number: [id number] Patient Account Number: [id number] Date of Birth/Sex: Treating RN: 08-Sep-1971 (52 y.o. [name] Primary Care Emaya Preston: Asencion Noble Other Clinician: Referring Aneesah Hernan: Treating Kaely Hollan/Extender: Madelynn Done in Treatment:  7 Active Inactive Wound/Skin Impairment Nursing Diagnoses: Impaired tissue integrity Knowledge deficit related to ulceration/compromised skin integrity Goals: Patient will have a decrease in wound volume by X% from date: (specify in notes) Date Initiated: 01/24/2023 Target Resolution Date: 05/02/2023 Goal Status: Active Patient/caregiver will verbalize understanding of skin care regimen Date Initiated: 01/24/2023 Target Resolution Date: 05/02/2023 Goal Status: Active Ulcer/skin breakdown will have a volume reduction of 30% by week 4 Date Initiated: 01/24/2023 Date Inactivated: 03/07/2023 Target Resolution Date: 02/27/2023 Goal Status: Unmet Unmet Reason: larger today. Ulcer/skin breakdown will have a volume reduction of 50% by week 8 Date Initiated: 01/24/2023 Target Resolution Date: 05/02/2023 Goal Status: Active Interventions: Assess patient/caregiver ability to obtain necessary supplies Assess patient/caregiver ability to perform ulcer/skin care regimen upon admission and as needed Assess ulceration(s) every visit Notes: Electronic Signature(s) Signed: 03/20/2023 5:21:08 PM By: Deon Pilling RN, BSN Entered By: Deon Pilling on 03/20/2023 13:51:31 -------------------------------------------------------------------------------- Pain Assessment Details Patient Name: Date of  Service: [name], [name] 03/20/2023 1:30 PM Medical Record Number: [id number] Patient Account Number: [id number] Date of Birth/Sex: Treating RN: 10-13-1971 (52 y.o. M) Primary Care Anneth Brunell: Asencion Noble Other Clinician: Referring Najwa Spillane: Treating Jayvier Burgher/Extender: Madelynn Done in Treatment: 7 Active Problems Location of Pain Severity and Description of Pain Patient Has Paino No Site Locations [place], [place] ([id number]) [id number] Page 5 of 7 Pain Management and Medication Current Pain Management: Notes shooting pain at times Electronic  Signature(s) Signed: 03/24/2023 4:16:21 PM By: Erenest Blank Entered By: Erenest Blank on 03/20/2023 13:23:13 -------------------------------------------------------------------------------- Patient/Caregiver Education Details Patient Name: Date of Service: [name], [name] 3/21/2024andnbsp1:30 PM Medical Record Number: [id number] Patient Account Number: [id number] Date of Birth/Gender: Treating RN: 09-07-1971 (52 y.o. [name] Primary Care Physician: Asencion Noble Other Clinician: Referring Physician: Treating Physician/Extender: Madelynn Done in Treatment: 7 Education Assessment Education Provided To: Patient Education Topics Provided Wound/Skin Impairment: Handouts: Caring for Your Ulcer Methods: Explain/Verbal Responses: Reinforcements needed Electronic Signature(s) Signed: 03/20/2023 5:21:08 PM By: Deon Pilling RN, BSN Entered By: Deon Pilling on 03/20/2023 13:51:43 -------------------------------------------------------------------------------- Wound Assessment Details Patient Name: Date of Service: [name] 03/20/2023 1:30 PM Medical Record Number: [id number] Patient Account Number: [id number] Date of Birth/Sex: Treating RN: [birth year] (52 y.o. M) Primary Care Earla Charlie: Asencion Noble Other Clinician: Referring Lorree Millar: Treating Dezmin Kittelson/Extender: Madelynn Done in Treatment: 7 Wound Status Wound Number: 3 Primary Diabetic Wound/Ulcer of the Lower Extremity Etiology: Wound Location: Left, Plantar Foot WOLFGANG, BOVEE ([id number]) [id number] Page 6 of 7 Wound Open Wounding Event: Gradually Appeared Status: Date Acquired: 10/01/2022 Comorbid Chronic sinus problems/congestion, Congestive Heart Failure, Weeks Of Treatment: 7 History: Coronary Artery Disease, Hypertension, Myocardial Infarction, Type Clustered Wound: No II Diabetes, Neuropathy Pending Amputation  On Presentation Wound under treatment by Jeniece Hannis outside of [facility] Wound Measurements Length: (cm) 1.3 Width: (cm) 0.7 Depth: (cm) 0.3 Area: (cm) 0.715 Volume: (cm) 0.214 % Reduction in Area: 81.8% % Reduction in Volume: 72.7% Epithelialization: Small (1-33%) Tunneling: No Undermining: No Wound Description Classification: Grade 1 Wound Margin: Thickened Exudate Amount: Medium Exudate Type: Serosanguineous Exudate Color: red, brown Foul Odor After Cleansing: No Slough/Fibrino Yes Wound Bed Granulation Amount: Large (67-100%) Exposed Structure Granulation Quality: Red Fascia Exposed: No Necrotic Amount: None Present (0%) Fat Layer (Subcutaneous Tissue) Exposed: Yes Tendon Exposed: No Muscle Exposed: No Joint Exposed: No Bone Exposed: No Periwound Skin Texture Texture Color No Abnormalities Noted: No No Abnormalities Noted: No Callus: Yes Atrophie Blanche: No Crepitus: No Cyanosis: No Excoriation: No Ecchymosis: No Induration: No Erythema: No Rash: No Hemosiderin Staining: No Scarring: No Mottled: No Pallor: No Moisture Rubor: No No Abnormalities Noted: No Dry / Scaly: No Temperature / Pain Maceration: No Temperature: No Abnormality Treatment Notes Wound #3 (Foot) Wound Laterality: Plantar, Left Cleanser Wound Cleanser Discharge Instruction: Cleanse the wound with wound cleanser prior to applying a clean dressing using [name] sponges, not tissue or cotton balls. Peri-Wound Care Topical Primary Dressing Vashe Discharge Instruction: wet to dry [name]. 2x2 [name] BENET, KITCHELL ([id number]) [id number] Page 7 of 7 Discharge Instruction: sock [name] with vashe. Secondary Dressing ABD Pad, 5x9 Discharge Instruction: ABD pad donut. Secured With [employer], 4.5x3.1 (in/yd) Discharge Instruction: Secure with Kerlix as directed. 49M Medipore H Soft Cloth Surgical T ape, 4 x 10 (in/yd) Discharge Instruction:  Secure with tape as directed. Compression Wrap Compression Stockings Add-Ons Electronic Signature(s) Signed: 03/24/2023 4:16:21 PM By: Erenest Blank Entered  By: Erenest Blank on 03/20/2023 13:28:09 -------------------------------------------------------------------------------- Vitals Details Patient Name: Date of Service: [name] [place], [place] RO LD 03/20/2023 1:30 PM Medical Record Number: [id number] Patient Account Number: [id number] Date of Birth/Sex: Treating RN: 10-Sep-1971 (52 y.o. M) Primary Care Woodley Petzold: Asencion Noble Other Clinician: Referring Laryn Venning: Treating Kwynn Schlotter/Extender: Madelynn Done in Treatment: 7 Vital Signs Time Taken: 13:22 Temperature (F): 98.2 Height (in): 78 Pulse (bpm): 90 Weight (lbs): 275 Respiratory Rate (breaths/min): 18 Body Mass Index (BMI): 31.8 Blood Pressure (mmHg): 100/68 Reference Range: 80 - 120 mg / dl Electronic Signature(s) Signed: 03/24/2023 4:16:21 PM By: Erenest Blank Entered By: Erenest Blank on 03/20/2023 13:22:46

## 2023-03-27 ENCOUNTER — Other Ambulatory Visit: Payer: Self-pay

## 2023-03-27 ENCOUNTER — Encounter (HOSPITAL_BASED_OUTPATIENT_CLINIC_OR_DEPARTMENT_OTHER): Payer: 59 | Admitting: Internal Medicine

## 2023-03-27 ENCOUNTER — Other Ambulatory Visit (HOSPITAL_COMMUNITY): Payer: Self-pay

## 2023-03-27 DIAGNOSIS — I2510 Atherosclerotic heart disease of native coronary artery without angina pectoris: Secondary | ICD-10-CM | POA: Diagnosis not present

## 2023-03-27 DIAGNOSIS — I130 Hypertensive heart and chronic kidney disease with heart failure and stage 1 through stage 4 chronic kidney disease, or unspecified chronic kidney disease: Secondary | ICD-10-CM | POA: Diagnosis not present

## 2023-03-27 DIAGNOSIS — L97522 Non-pressure chronic ulcer of other part of left foot with fat layer exposed: Secondary | ICD-10-CM | POA: Diagnosis not present

## 2023-03-27 DIAGNOSIS — N189 Chronic kidney disease, unspecified: Secondary | ICD-10-CM | POA: Diagnosis not present

## 2023-03-27 DIAGNOSIS — I5042 Chronic combined systolic (congestive) and diastolic (congestive) heart failure: Secondary | ICD-10-CM | POA: Diagnosis not present

## 2023-03-27 DIAGNOSIS — Z7984 Long term (current) use of oral hypoglycemic drugs: Secondary | ICD-10-CM | POA: Diagnosis not present

## 2023-03-27 DIAGNOSIS — Z89422 Acquired absence of other left toe(s): Secondary | ICD-10-CM | POA: Diagnosis not present

## 2023-03-27 DIAGNOSIS — I252 Old myocardial infarction: Secondary | ICD-10-CM | POA: Diagnosis not present

## 2023-03-27 DIAGNOSIS — E1140 Type 2 diabetes mellitus with diabetic neuropathy, unspecified: Secondary | ICD-10-CM | POA: Diagnosis not present

## 2023-03-27 DIAGNOSIS — Z955 Presence of coronary angioplasty implant and graft: Secondary | ICD-10-CM | POA: Diagnosis not present

## 2023-03-27 DIAGNOSIS — E11621 Type 2 diabetes mellitus with foot ulcer: Secondary | ICD-10-CM | POA: Diagnosis not present

## 2023-03-27 DIAGNOSIS — Z794 Long term (current) use of insulin: Secondary | ICD-10-CM | POA: Diagnosis not present

## 2023-03-28 ENCOUNTER — Other Ambulatory Visit (HOSPITAL_COMMUNITY): Payer: Self-pay

## 2023-03-28 ENCOUNTER — Other Ambulatory Visit: Payer: Self-pay

## 2023-04-02 NOTE — Progress Notes (Signed)
PARAMVEER, SWOVELAND ([id number]) [id number] Page 1 of 10 Visit Report for 03/27/2023 Chief Complaint Document Details Patient Name: Date of Service: [name] [place], [place] RO LD 03/27/2023 2:45 PM Medical Record Number: [id number] Patient Account Number: [id number] Date of Birth/Sex: Treating RN: 02-23-1971 (52 y.o. M) Primary Care Provider: Asencion Noble Other Clinician: Referring Provider: Treating Provider/Extender: Madelynn Done in Treatment: 8 Information Obtained from: Patient Chief Complaint 01/15/2023; left foot wound Electronic Signature(s) Signed: 03/27/2023 3:28:11 PM By: Kalman Shan DO Entered By: Kalman Shan on 03/27/2023 15:20:51 -------------------------------------------------------------------------------- Debridement Details Patient Name: Date of Service: [name] [place], [name] 03/27/2023 2:45 PM Medical Record Number: [id number] Patient Account Number: [id number] Date of Birth/Sex: Treating RN: 10/21/1971 (52 y.o. [name], [name] Primary Care Provider: Asencion Noble Other Clinician: Referring Provider: Treating Provider/Extender: Madelynn Done in Treatment: 8 Debridement Performed for Assessment: Wound #3 Left,Plantar Foot Performed By: Physician Kalman Shan, DO Debridement Type: Debridement Severity of Tissue Pre Debridement: Fat layer exposed Level of Consciousness (Pre-procedure): Awake and Alert Pre-procedure Verification/Time Out Yes - 15:17 Taken: Start Time: 15:17 Pain Control: Lidocaine T Area Debrided (L x W): otal 1.2 (cm) x 0.8 (cm) = 0.96 (cm) Tissue and other material debrided: Viable, Non-Viable, Slough, Slough Level: Non-Viable Tissue Debridement Description: Selective/Open Wound Instrument: Curette Bleeding: Minimum Hemostasis Achieved: Pressure End Time: 15:17 Procedural Pain: 0 Post Procedural Pain: 0 Response to Treatment: Procedure was tolerated  well Level of Consciousness (Post- Awake and Alert procedure): Post Debridement Measurements of Total Wound Length: (cm) 1.2 Width: (cm) 0.8 Depth: (cm) 0.2 Volume: (cm) 0.151 Character of Wound/Ulcer Post Debridement: Improved Severity of Tissue Post Debridement: Fat layer exposed Post Procedure Diagnosis Same as Pre-procedure Electronic Signature(s) Signed: 03/27/2023 3:28:11 PM By: Kalman Shan DO Signed: 04/02/2023 4:24:30 PM By: Rhae Hammock RN Entered By: Rhae Hammock on 03/27/2023 15:19:03 [name] ([id number]) [id number] Page 2 of 10 -------------------------------------------------------------------------------- HPI Details Patient Name: Date of Service: [name] [place], [place] RO LD 03/27/2023 2:45 PM Medical Record Number: [id number] Patient Account Number: [id number] Date of Birth/Sex: Treating RN: 1971-07-26 (52 y.o. M) Primary Care Provider: Asencion Noble Other Clinician: Referring Provider: Treating Provider/Extender: Madelynn Done in Treatment: 8 History of Present Illness HPI Description: ADMISSION 01/11/2020 This is a 52 year old man with uncontrolled type 2 diabetes with a recent hemoglobin A1c earlier this year of 13.4. He is on insulin and glipizide. He does not take his blood sugars at home. He does have a follow-up with primary care later this month I believe on January 27. He tells [place] that roughly a month ago he was walking with a shoe with a hole in his foot. He took the shoe off and there was an open wound at roughly the left fourth met head. This has significant undermining and raised edges. He has not noticed any purulence he does not feel unwell. More recently he was taking skin off the bottom of his foot and has a superficial area on the left fifth met head. He has not been offloading this. The patient was in the ER on 12/20. They gave him Bactroban which she has been using on the wound  and 10 days worth of doxycycline. No x-rays were done. He has not had vascular studies. He is also been using hydrogen peroxide. Past medical history type 2 diabetes uncontrolled, chronic systolic heart failure, coronary artery disease with a history of congestive heart failure with stents. Hypertension hyperlipidemia and chronic renal insufficiency ABI in  this clinic was 1.14 on the left. 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 He is uncertain whether he would be able to work if we put him on some form of restriction 1/19; he is generally doing quite well. Using silver alginate on the wounds. Things actually look better. He has a forefoot offloading boot which she seems to be compliant about. He has support at work to stay off his foot is much as possible which is gratifying. Culture I did last week showed a few Enterococcus faecalis. I am going to put him on Augmentin. I talked about ordering an x-ray in my note last week but that does not seem to have happened. We will review reorder the x-ray this week. 1/26; x-ray reordered last week was negative for osteomyelitis. We are using silver alginate on the wound on the third and fifth met heads on the left. He is using a Darco forefoot off loader 2/2; the area on the fifth met head is closed. Third met head is still open with tunneling depth and thick callus. 2/9; the area on the fifth met head remains closed however the third met head again has a small open area on presentation with tunneling in depth and surrounded by thick callus. This looks like a pressure issue. We have been using silver alginate 2/16; the area of the fifth metatarsal head remains closed however the area over the third metatarsal head again is a small open area but with some depth. I do not think this is changed much since last week. He is using Hydrofera Blue with forefoot off loader. He is not able to use a total contact cast on the left  leg because he needs his left leg at work [employer]]. Fortunately the wound does not look infected. I changed him to endoform today 2/26; the area of the fifth metatarsal head remains closed. The area of the third metatarsal head has an even smaller opening this time. I used endoform on this this looks improved. He is offloading this is much as he can and a forefoot off loader on the left. He cannot have a total contact cast because of work responsibilities 3/5; the area on the fifth metatarsal head remains closed the area on the third metatarsal head is also closed on the left foot. 01/15/2023 Mr. 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 He has been following for podiatry for several months for this issue. He is currently using wet-to-dry saline dressings. He currently denies signs of infection. Progresse note from 1/17; Patient presents with a 27-month history of nonhealing ulcer to the left foot secondary to diabetes and inability to offload well the area. He has had multiple debridements in the past to his feet bilaterally. He has had resection of the fourth left met head in the fifth toe Secondary to osteomyelitis. We discussed the importance of glycemic control for wound healing. Due to his blood glucose levels being elevated he is at high risk for infection and thus further amputation. He expressed understanding. He states he is supposed to be referred to an endocrinologist at [facility] however the referral fell through. Unclear what happened. Offered a  referral to [facility]ocrinology at [facility]. Patient was agreeable.Furthermore we discussed the  importance of aggressive offloading for his wound healing. This will be the most difficult part of the treatment plan for the patient to do. We discussed a total contact cast however he has declined that at this time. 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 He is currently using a surgical shoe with a peg assist. It does not fit well so we will give him a new one today. I do not think this is enough offloading. If he is not able to offload this area he will likely end up with a BKA. He is well aware of this. For now I recommended Medihoney and Hydrofera Blue for dressing changes. He will follow-up in our [place] office Since this is closer for him. 1/26; patient presents for follow-up. Last clinic visit I had seen him in [place] and we transferred him to [place] since this is a closer location for him. Progress note above from that visit. Over the past week He has been using Medihoney and Hydrofera Blue to the wound bed. He has been using his surgical shoe with peg assist. He has no issues or complaints today. We discussed doing the total contact cast and he was agreeable to having this placed at next clinic visit. 2/2; patient presents for follow-up. He has been using Vashe wet-to-dry dressings. Plan is for the total contact cast today. 2/5; patient presents for follow-up. The cast was placed 3 days ago. He tolerated this well however had a lot of drainage. He currently denies signs of infection. 2/9; patient presents for follow-up. At last clinic visit we held off on replacing the cast due to drainage. He has been using Vashe wet-to-dry dressings and he is currently taking the oral antibiotics prescribed without issues. He declines a total contact cast today. We ran insurance verification for skin substitute and due to cost patient declines having this placed. 2/16; patient presents for follow-up. He has decided  not to follow-up with podiatry. He continues to decline the total contact cast. He has been using Vashe wet- to-dry dressings. He has not been wearing his surgical shoe with peg assist. He currently denies signs of infection. 3/1; patient presents for follow-up. He has been doing Vashe wet-to-dry dressings. He has a surgical shoe with peg assist. We discussed potentially doing a skin substitute for which she has been approved for by insurance. He knows the out-of-pocket cost of this and would still like to proceed with having this placed today. He denies the current total contact cast. [name], [name] ([id number]) [id number] Page 3 of 10 3/8; patient presents for follow-up. He states that the skin substitute came off after a few days and he has been using Vashe wet-to-dry dressings. He has mild odor to the wound bed on exam. 3/15; patient presents for follow-up. He has been using Vashe wet-to-dry dressings and started his course of Augmentin. He reports improvement in wound healing. He has no issues or complaints today. 3/21; patient presents for follow-up. He continues to use Vashe wet-to-dry dressings. He has no issues or complaints today. 3/28; patient presents for follow-up. He has been using Vashe wet-to-dry dressings. He has no issues or complaints today. He has been using his peg assist with surgical shoe. Electronic Signature(s) Signed: 03/27/2023 3:28:11 PM By: Kalman Shan DO Entered By: Kalman Shan on 03/27/2023 15:21:16 -------------------------------------------------------------------------------- Physical  Exam Details Patient Name: Date of Service: [name] [place], [place] RO LD 03/27/2023 2:45 PM Medical Record Number: [id number] Patient Account Number: [id number] Date of Birth/Sex: Treating RN: 1971-12-05 (52 y.o. M) Primary Care Provider: Asencion Noble Other Clinician: Referring Provider: Treating Provider/Extender: Madelynn Done in Treatment: 8 Constitutional respirations regular, non-labored and within target range for patient.. Cardiovascular 2+ dorsalis pedis/posterior tibialis pulses. Psychiatric pleasant and cooperative. Notes Left foot: T the lateral aspect, fifth met head there is an open wound with granulation tissue And nonviable tissue. No purulent drainage. No odor on exam. No o obvious increase in warmth or erythema. Electronic Signature(s) Signed: 03/27/2023 3:28:11 PM By: Kalman Shan DO Entered By: Kalman Shan on 03/27/2023 15:21:43 -------------------------------------------------------------------------------- Physician Orders Details Patient Name: Date of Service: [name] [place], [name] 03/27/2023 2:45 PM Medical Record Number: [id number] Patient Account Number: [id number] Date of Birth/Sex: Treating RN: 04/24/1971 (52 y.o. [name], [name] Primary Care Provider: Asencion Noble Other Clinician: Referring Provider: Treating Provider/Extender: Madelynn Done in Treatment: 8 Verbal / Phone Orders: No Diagnosis Coding Follow-up Appointments ppointment in 2 weeks. - Dr. Heber Clyde Room 9 04/10/2023 245pm Return A Cellular or Tissue Based Products Cellular or Tissue Based Product Type: - apligraf and grafix 50% coinsurance 02/28/23-apply 1st Grafix 03/07/2023 HOLD THIS WEEK. Bathing/ Shower/ Hygiene May shower with protection but do not get wound dressing(s) wet. Protect dressing(s) with water repellant cover (for example, large plastic bag) or a cast cover and may then take shower. Off-Loading Other: - keep pressure off left lateral foot; use peg assist as insert in surgical shoe Additional Orders / Instructions GREYLIN, THORLEY ([id number]) [id number] Page 4 of 10 Other: - ice, elevation and ace wrap to foot and ankle to aid in the right medial foot area. Wound Treatment Wound #3 - Foot Wound Laterality: Plantar,  Left Cleanser: Wound Cleanser 1 x Per [id number] Days Discharge Instructions: Cleanse the wound with wound cleanser prior to applying a clean dressing using [name] sponges, not tissue or cotton balls. Prim Dressing: Vashe 1 x Per [id number] Days ary Discharge Instructions: wet to dry [name]. Prim Dressing: 2x2 [name] 1 x Per Day/15 Days ary Discharge Instructions: sock [name] with vashe. Secondary Dressing: ABD Pad, 5x9 1 x Per Day/15 Days Discharge Instructions: ABD pad donut. Secured With: [employer], 4.5x3.1 (in/yd) 1 x Per Day/15 Days Discharge Instructions: Secure with Kerlix as directed. Secured With: 84M Medipore H Soft Cloth Surgical T ape, 4 x 10 (in/yd) 1 x Per Day/15 Days Discharge Instructions: Secure with tape as directed. Electronic Signature(s) Signed: 03/27/2023 3:28:11 PM By: Kalman Shan DO Entered By: Kalman Shan on 03/27/2023 15:21:51 -------------------------------------------------------------------------------- Problem List Details Patient Name: Date of Service: [name] [place], [name] 03/27/2023 2:45 PM Medical Record Number: [id number] Patient Account Number: [id number] Date of Birth/Sex: Treating RN: 03/07/1971 (52 y.o. M) Primary Care Provider: Asencion Noble Other Clinician: Referring Provider: Treating Provider/Extender: Madelynn Done in Treatment: 8 Active Problems ICD-10 Encounter Code Description Active Date MDM Diagnosis [phone] Non-pressure chronic ulcer of other part of left foot with fat layer exposed 01/24/2023 No Yes E11.621 Type 2 diabetes mellitus with foot ulcer 01/24/2023 No Yes Z89.422 Acquired absence of other left toe(s) 01/24/2023 No Yes Inactive Problems Resolved Problems Electronic Signature(s) Signed: 03/27/2023 3:28:11 PM By: Kalman Shan DO Entered By: Kalman Shan on 03/27/2023 15:20:35 -------------------------------------------------------------------------------- Progress Note  Details Patient Name: Date of Service: [name] [place], [name] 03/27/2023 2:45  PM Medical Record Number: [id number] Patient Account Number: [id number] Date of Birth/Sex: Treating RN: 1971-06-26 (52 y.o. [name], Joneen Boers ([id number]) [id number] Page 5 of 10 Primary Care Provider: Asencion Noble Other Clinician: Referring Provider: Treating Provider/Extender: Madelynn Done in Treatment: 8 Subjective Chief Complaint Information obtained from Patient 01/15/2023; left foot wound History of Present Illness (HPI) ADMISSION 01/11/2020 This is a 52 year old man with uncontrolled type 2 diabetes with a recent hemoglobin A1c earlier this year of 13.4. He is on insulin and glipizide. He does not take his blood sugars at home. He does have a follow-up with primary care later this month I believe on January 27. He tells [place] that roughly a month ago he was walking with a shoe with a hole in his foot. He took the shoe off and there was an open wound at roughly the left fourth met head. This has significant undermining and raised edges. He has not noticed any purulence he does not feel unwell. More recently he was taking skin off the bottom of his foot and has a superficial area on the left fifth met head. He has not been offloading this. The patient was in the ER on 12/20. They gave him Bactroban which she has been using on the wound and 10 days worth of doxycycline. No x-rays were done. He has not had vascular studies. He is also been using hydrogen peroxide. Past medical history type 2 diabetes uncontrolled, chronic systolic heart failure, coronary artery disease with a history of congestive heart failure with stents. Hypertension hyperlipidemia and chronic renal insufficiency ABI in this clinic was 1.14 on the left. 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 He is uncertain whether he would be able to work if we put him on  some form of restriction 1/19; he is generally doing quite well. Using silver alginate on the wounds. Things actually look better. He has a forefoot offloading boot which she seems to be compliant about. He has support at work to stay off his foot is much as possible which is gratifying. Culture I did last week showed a few Enterococcus faecalis. I am going to put him on Augmentin. I talked about ordering an x-ray in my note last week but that does not seem to have happened. We will review reorder the x-ray this week. 1/26; x-ray reordered last week was negative for osteomyelitis. We are using silver alginate on the wound on the third and fifth met heads on the left. He is using a Darco forefoot off loader 2/2; the area on the fifth met head is closed. Third met head is still open with tunneling depth and thick callus. 2/9; the area on the fifth met head remains closed however the third met head again has a small open area on presentation with tunneling in depth and surrounded by thick callus. This looks like a pressure issue. We have been using silver alginate 2/16; the area of the fifth metatarsal head remains closed however the area over the third metatarsal head again is a small open area but with some depth. I do not think this is changed much since last week. He is using Hydrofera Blue with forefoot off loader. He is not able to use a total contact cast on the left leg because he needs his left leg at work [employer]]. Fortunately the wound does not look infected. I changed him to endoform today 2/26; the area of the fifth metatarsal head  remains closed. The area of the third metatarsal head has an even smaller opening this time. I used endoform on this this looks improved. He is offloading this is much as he can and a forefoot off loader on the left. He cannot have a total contact cast because of work responsibilities 3/5; the area on the fifth metatarsal head remains closed the area on  the third metatarsal head is also closed on the left foot. 01/15/2023 Mr. 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 He has been following for podiatry for several months for this issue. He is currently using wet-to-dry saline dressings. He currently denies signs of infection. Progresse note from 1/17; Patient presents with a 49-month history of nonhealing ulcer to the left foot secondary to diabetes and inability to offload well the area. He has had multiple debridements in the past to his feet bilaterally. He has had resection of the fourth left met head in the fifth toe Secondary to osteomyelitis. We discussed the importance of glycemic control for wound healing. Due to his blood glucose levels being elevated he is at high risk for infection and thus further amputation. He expressed understanding. He states he is supposed to be referred to an endocrinologist at [facility] however the referral fell through. Unclear what happened. Offered a referral to endocrinology at [facility] Patient was agreeable.Furthermore we discussed the importance of aggressive offloading for his wound healing. This will be the most difficult part of the treatment plan for the patient to do. We discussed a total contact cast however he has declined that at this time. 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 He is currently using a surgical shoe with a peg assist. It does not fit well so we will give him a new one today. I do not think this is enough offloading. If he is not able to offload this area he will likely end  up with a BKA. He is well aware of this. For now I recommended Medihoney and Hydrofera Blue for dressing changes. He will follow-up in our [place] office Since this is closer for him. 1/26; patient presents for follow-up. Last clinic visit I had seen him in [place] and we transferred him to [place] since this is a closer location for him. Progress note above from that visit. Over the past week He has been using Medihoney and Hydrofera Blue to the wound bed. He has been using his surgical shoe with peg assist. He has no issues or complaints today. We discussed doing the total contact cast and he was agreeable to having this placed at next clinic visit. 2/2; patient presents for follow-up. He has been using Vashe wet-to-dry dressings. Plan is for the total contact cast today. 2/5; patient presents for follow-up. The cast was placed 3 days ago. He tolerated this well however had a lot of drainage. He currently denies signs of infection. 2/9; patient presents for follow-up. At last clinic visit we held off on replacing the cast due to drainage. He has been using Vashe wet-to-dry dressings and he is currently taking the oral antibiotics prescribed  without issues. He declines a total contact cast today. We ran insurance verification for skin substitute and due to cost patient declines having this placed. 2/16; patient presents for follow-up. He has decided not to follow-up with podiatry. He continues to decline the total contact cast. He has been using Vashe wet- to-dry dressings. He has not been wearing his surgical shoe with peg assist. He currently denies signs of infection. 3/1; patient presents for follow-up. He has been doing Vashe wet-to-dry dressings. He has a surgical shoe with peg assist. We discussed potentially doing a skin substitute for which she has been approved for by insurance. He knows the out-of-pocket cost of this and would still like to proceed with having this placed today.  He denies the current total contact cast. 3/8; patient presents for follow-up. He states that the skin substitute came off after a few days and he has been using Vashe wet-to-dry dressings. He has mild odor to the wound bed on exam. 3/15; patient presents for follow-up. He has been using Vashe wet-to-dry dressings and started his course of Augmentin. He reports improvement in wound healing. He has no issues or complaints today. [name], [name] ([id number]) [id number] Page 6 of 10 3/21; patient presents for follow-up. He continues to use Vashe wet-to-dry dressings. He has no issues or complaints today. 3/28; patient presents for follow-up. He has been using Vashe wet-to-dry dressings. He has no issues or complaints today. He has been using his peg assist with surgical shoe. Patient History Information obtained from Patient. Family History Heart Disease - Mother,Father, No family history of Cancer, Diabetes, Hereditary Spherocytosis, Hypertension, Kidney Disease, Lung Disease, Seizures, Stroke, Thyroid Problems, Tuberculosis. Social History Never smoker, Marital Status - Single, Alcohol Use - Rarely, Drug Use - No History, Caffeine Use - Rarely. 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 81 Denies history of Angina, Arrhythmia, Deep Vein Thrombosis, Hypotension, Peripheral Arterial Disease, Peripheral Venous Disease, Phlebitis,  Vasculitis Gastrointestinal Denies history of Cirrhosis , Colitis, Crohnoos, Hepatitis A, Hepatitis B, Hepatitis C Endocrine Patient has history of Type II Diabetes Genitourinary Denies history of End Stage Renal Disease Immunological Denies history of Lupus Erythematosus, Raynaudoos, Scleroderma Integumentary (Skin) Denies history of History of Burn Musculoskeletal Denies history of Gout, Rheumatoid Arthritis, Osteoarthritis, Osteomyelitis Neurologic Patient has history of Neuropathy Denies history of Dementia, Quadriplegia, Paraplegia, Seizure Disorder Oncologic Denies history of Received Chemotherapy, Received Radiation Psychiatric Denies history of Anorexia/bulimia, Confinement Anxiety Hospitalization/Surgery History - Heart Cath in 2018. Medical A Surgical History Notes nd Cardiovascular Ischemic Cardiomyopathy Genitourinary Renal Insufficiency Objective Constitutional respirations regular, non-labored and within target range for patient.. Vitals Time Taken: 2:57 PM, Height: 78 in, Weight: 275 lbs, BMI: 31.8, Temperature: 98.7 F, Pulse: 74 bpm, Respiratory Rate: 17 breaths/min, Blood Pressure: 114/78 mmHg. Cardiovascular 2+ dorsalis pedis/posterior tibialis pulses. Psychiatric pleasant and cooperative. General Notes: Left foot: T the lateral aspect, fifth met head there is an open wound with granulation tissue And nonviable tissue. No purulent drainage. No o odor on exam. No obvious increase in warmth or erythema. Integumentary (Hair, Skin) Wound #3 status is Open. Original cause of wound was Gradually Appeared. The date acquired was:  10/01/2022. The wound has been in treatment 8 weeks. The wound is located on the [place]. The wound measures 1.2cm length x 0.8cm width x 0.2cm depth; 0.754cm^2 area and 0.151cm^3 volume. There is [name], [name] ([id number]) [id number] Page 7 of 10 Fat Layer (Subcutaneous Tissue) exposed. There  is no tunneling or undermining noted. There is a medium amount of serosanguineous drainage noted. The wound margin is thickened. There is large (67-100%) red granulation within the wound bed. There is no necrotic tissue within the wound bed. The periwound skin appearance exhibited: Callus. The periwound skin appearance did not exhibit: Crepitus, Excoriation, Induration, Rash, Scarring, Dry/Scaly, Maceration, Atrophie Blanche, Cyanosis, Ecchymosis, Hemosiderin Staining, Mottled, Pallor, Rubor, Erythema. Periwound temperature was noted as No Abnormality. 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 I debrided nonviable tissue. I recommended continue the course with Vashe wet-to-dry dressings and aggressive offloading with surgical shoe and peg assist. Procedures Wound #3 Pre-procedure diagnosis of Wound #3 is a Diabetic Wound/Ulcer of the Lower Extremity located on the [place] .Severity of Tissue Pre Debridement is: Fat layer exposed. There was a Selective/Open Wound Non-Viable Tissue Debridement with a total area of 0.96 sq cm performed by Kalman Shan, DO. With the following instrument(s): Curette to remove Viable and Non-Viable tissue/material. Material removed includes [place] after achieving pain control using Lidocaine. No specimens were taken. A time out was conducted at 15:17, prior to the start of the procedure. A Minimum amount of bleeding was controlled with Pressure. The procedure was tolerated well with a pain level of 0 throughout and a pain level of 0 following the procedure. Post Debridement Measurements: 1.2cm length x 0.8cm width x 0.2cm depth; 0.151cm^3 volume. Character of Wound/Ulcer Post Debridement is improved. Severity of Tissue Post Debridement is: Fat layer exposed. Post  procedure Diagnosis Wound #3: Same as Pre-Procedure Plan Follow-up Appointments: Return Appointment in 2 weeks. - Dr. Heber [place] Room 9 04/10/2023 245pm Cellular or Tissue Based Products: Cellular or Tissue Based Product Type: - apligraf and grafix 50% coinsurance 02/28/23-apply 1st Grafix 03/07/2023 HOLD THIS WEEK. Bathing/ Shower/ Hygiene: May shower with protection but do not get wound dressing(s) wet. Protect dressing(s) with water repellant cover (for example, large plastic bag) or a cast cover and may then take shower. Off-Loading: Other: - keep pressure off left lateral foot; use peg assist as insert in surgical shoe Additional Orders / Instructions: Other: - ice, elevation and ace wrap to foot and ankle to aid in the right medial foot area. WOUND #3: - Foot Wound Laterality: Plantar, Left Cleanser: Wound Cleanser 1 x Per Day/15 Days Discharge Instructions: Cleanse the wound with wound cleanser prior to applying a clean dressing using [name] sponges, not tissue or cotton balls. Prim Dressing: Vashe 1 x Per [id number] Days ary Discharge Instructions: wet to dry [name]. Prim Dressing: 2x2 [name] 1 x Per Day/15 Days ary Discharge Instructions: sock [name] with vashe. Secondary Dressing: ABD Pad, 5x9 1 x Per Day/15 Days Discharge Instructions: ABD pad donut. Secured With: [employer], 4.5x3.1 (in/yd) 1 x Per Day/15 Days Discharge Instructions: Secure with Kerlix as directed. Secured With: 15M Medipore H Soft Cloth Surgical T ape, 4 x 10 (in/yd) 1 x Per Day/15 Days Discharge Instructions: Secure with tape as directed. 1. In office sharp debridement 2. Vashe wet-to-dry dressings 3. Peg assist with surgical shoeooaggressive offloading 4. Follow-up  in 2 weeks Electronic Signature(s) Signed: 03/27/2023 3:28:11 PM By: Kalman Shan DO Entered By: Kalman Shan on 03/27/2023 15:26:24 [name] ([id number]) [id number] Page 8 of  10 -------------------------------------------------------------------------------- HxROS Details Patient Name: Date of Service: [name] [place], [place] RO LD 03/27/2023 2:45 PM Medical Record Number: [id number] Patient Account Number: [id number] Date of Birth/Sex: Treating RN: October 22, 1971 (52 y.o. M) Primary Care Provider: Asencion Noble Other Clinician: Referring Provider: Treating Provider/Extender: Madelynn Done in Treatment: 8 Information Obtained From Patient Eyes Medical History: Negative for: Cataracts; Glaucoma; Optic Neuritis Ear/Nose/Mouth/Throat Medical History: Positive for: Chronic sinus problems/congestion Negative for: Middle ear problems Hematologic/Lymphatic Medical History: Negative for: Anemia; Hemophilia; Human Immunodeficiency Virus; Lymphedema; Sickle Cell Disease Respiratory Medical History: Negative for: Aspiration; Asthma; Chronic Obstructive Pulmonary Disease (COPD); Pneumothorax; Sleep Apnea; Tuberculosis Cardiovascular Medical History: Positive for: Congestive Heart Failure; Coronary Artery Disease; Hypertension; Myocardial Infarction - age 5 Negative for: Angina; Arrhythmia; Deep Vein Thrombosis; Hypotension; Peripheral Arterial Disease; Peripheral Venous Disease; Phlebitis; Vasculitis Past Medical History Notes: Ischemic Cardiomyopathy Gastrointestinal Medical History: Negative for: Cirrhosis ; Colitis; Crohns; Hepatitis A; Hepatitis B; Hepatitis C Endocrine Medical History: Positive for: Type II Diabetes Time with diabetes: ten years Treated with: Insulin Blood sugar tested every day: No Genitourinary Medical History: Negative for: End Stage Renal Disease Past Medical History Notes: Renal Insufficiency Immunological Medical History: Negative for: Lupus Erythematosus; Raynauds; Scleroderma Integumentary (Skin) Medical History: Negative for: History of Burn Musculoskeletal Medical History: Negative for: Gout;  Rheumatoid Arthritis; Osteoarthritis; Osteomyelitis Neurologic [name], [name] ([id number]) [phone].pdf Page 9 of 10 Medical History: Positive for: Neuropathy Negative for: Dementia; Quadriplegia; Paraplegia; Seizure Disorder Oncologic Medical History: Negative for: Received Chemotherapy; Received Radiation Psychiatric Medical History: Negative for: Anorexia/bulimia; Confinement Anxiety HBO Extended History Items Ear/Nose/Mouth/Throat: Chronic sinus problems/congestion Immunizations Pneumococcal Vaccine: Received Pneumococcal Vaccination: No Implantable Devices None Hospitalization / Surgery History Type of Hospitalization/Surgery Heart Cath in 2018 Family and Social History Cancer: No; Diabetes: No; Heart Disease: Yes - Mother,Father; Hereditary Spherocytosis: No; Hypertension: No; Kidney Disease: No; Lung Disease: No; Seizures: No; Stroke: No; Thyroid Problems: No; Tuberculosis: No; Never smoker; Marital Status - Single; Alcohol Use: Rarely; Drug Use: No History; Caffeine Use: Rarely; Financial Concerns: No; Food, Clothing or Shelter Needs: No; Support System Lacking: No; Transportation Concerns: No Electronic Signature(s) Signed: 03/27/2023 3:28:11 PM By: Kalman Shan DO Entered By: Kalman Shan on 03/27/2023 15:21:22 -------------------------------------------------------------------------------- SuperBill Details Patient Name: Date of Service: [name] [place], [name] 03/27/2023 Medical Record Number: [id number] Patient Account Number: [id number] Date of Birth/Sex: Treating RN: 15-Feb-1971 (52 y.o. [name], [name] Primary Care Provider: Asencion Noble Other Clinician: Referring Provider: Treating Provider/Extender: Madelynn Done in Treatment: 8 Diagnosis Coding ICD-10 Codes Code Description [phone] 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.621 Type 2 diabetes mellitus  with foot ulcer Z89.422 Acquired absence of other left toe(s) Facility Procedures : CPT4 Code: [id number] Description: [id number] - DEBRIDE WOUND 1ST 20 SQ CM OR < ICD-10 Diagnosis Description L97.522 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.621 Type 2 diabetes mellitus with foot ulcer Modifier: Quantity: 1 Physician Procedures : CPT4 Code Description Modifier [id number] - WC PHYS DEBR WO ANESTH 20 SQ CM ICD-10 Diagnosis Description RENAULT, NILE ([id number]) [id number]. [id number] Non-pressure chronic ulcer of other part of left foot with fat layer  exposed E11.621 Type 2 diabetes mellitus with foot ulcer Quantity: 1 pdf Page 10 of 10 Electronic Signature(s) Signed: 03/27/2023  3:28:11 PM By: Kalman Shan DO Entered By: Kalman Shan on 03/27/2023 15:26:36

## 2023-04-02 NOTE — Progress Notes (Signed)
DODGE, FRIEDRICHS ([id number]) [id number] Page 1 of 7 Visit Report for 03/27/2023 Arrival Information Details Patient Name: Date of Service: [name] [place], [place] RO LD 03/27/2023 2:45 PM Medical Record Number: [id number] Patient Account Number: [id number] Date of Birth/Sex: Treating RN: 09-Jun-1971 (52 y.o. [name], Lauren Primary Care Ileanna Gemmill: Asencion Noble Other Clinician: Referring Seng Fouts: Treating Trig Mcbryar/Extender: Madelynn Done in Treatment: 8 Visit Information History Since Last Visit Added or deleted any medications: No Patient Arrived: Ambulatory Any new allergies or adverse reactions: No Arrival Time: 14:57 Had a fall or experienced change in No Accompanied By: self activities of daily living that may affect Transfer Assistance: None risk of falls: Patient Identification Verified: Yes Signs or symptoms of abuse/neglect since last visito No Secondary Verification Process Completed: Yes Hospitalized since last visit: No Patient Requires Transmission-Based Precautions: No Implantable device outside of the clinic excluding No Patient Has Alerts: Yes cellular tissue based products placed in the center Patient Alerts: 08/2021 ABI:1.2 TBI 0.79 since last visit: Has Dressing in Place as Prescribed: Yes Pain Present Now: No Electronic Signature(s) Signed: 04/02/2023 4:24:30 PM By: Rhae Hammock RN Entered By: Rhae Hammock on 03/27/2023 14:57:43 -------------------------------------------------------------------------------- Encounter Discharge Information Details Patient Name: Date of Service: [name], [name] 03/27/2023 2:45 PM Medical Record Number: [id number] Patient Account Number: [id number] Date of Birth/Sex: Treating RN: September 25, 1971 (52 y.o. [name], Lauren Primary Care Dorrance Sellick: Asencion Noble Other Clinician: Referring Kason Benak: Treating Rejina Odle/Extender: Madelynn Done in  Treatment: 8 Encounter Discharge Information Items Post Procedure Vitals Discharge Condition: Stable Temperature (F): 98.7 Ambulatory Status: Ambulatory Pulse (bpm): 74 Discharge Destination: Home Respiratory Rate (breaths/min): 17 Transportation: Private Auto Blood Pressure (mmHg): 133/82 Accompanied By: self Schedule Follow-up Appointment: Yes Clinical Summary of Care: Patient Declined Electronic Signature(s) Signed: 04/02/2023 4:24:30 PM By: Rhae Hammock RN Entered By: Rhae Hammock on 03/27/2023 15:20:35 -------------------------------------------------------------------------------- Lower Extremity Assessment Details Patient Name: Date of Service: [name], [name] 03/27/2023 2:45 PM Medical Record Number: [id number] Patient Account Number: [id number] Date of Birth/Sex: Treating RN: 11/25/1971 (52 y.o. [name], Lauren Primary Care Anali Cabanilla: Asencion Noble Other Clinician: Referring Elmon Shader: Treating Tima Curet/Extender: Madelynn Done in Treatment: 8 Edema Assessment Assessed: Shirlyn GoltzMarland Kitchen Yes] Patrice Paradise: No] F[LeftCyd Silence [id number] [[id number] Page 2 of 7] Edema: [Left: Ye] [Right: s] Calf Left: Right: Point of Measurement: 43 cm From Medial Instep 37 cm Ankle Left: Right: Point of Measurement: 9 cm From Medial Instep 22 cm Vascular Assessment Pulses: Dorsalis Pedis Palpable: [Left:Yes] Posterior Tibial Palpable: [Left:Yes] Electronic Signature(s) Signed: 04/02/2023 4:24:30 PM By: Rhae Hammock RN Entered By: Rhae Hammock on 03/27/2023 15:01:32 -------------------------------------------------------------------------------- Multi Wound Chart Details Patient Name: Date of Service: [name], [name] 03/27/2023 2:45 PM Medical Record Number: [id number] Patient Account Number: [id number] Date of Birth/Sex: Treating RN: 09-14-1971 (52 y.o. M) Primary Care Sircharles Holzheimer: Asencion Noble  Other Clinician: Referring Koran Seabrook: Treating Christyanna Mckeon/Extender: Madelynn Done in Treatment: 8 Vital Signs Height(in): 66 Pulse(bpm): 60 Weight(lbs): 275 Blood Pressure(mmHg): 114/78 Body Mass Index(BMI): 31.8 Temperature(F): 98.7 Respiratory Rate(breaths/min): 17 [3:Photos:] [N/A:N/A] Left, Plantar Foot N/A N/A Wound Location: Gradually Appeared N/A N/A Wounding Event: Diabetic Wound/Ulcer of the Lower N/A N/A Primary Etiology: Extremity Chronic sinus problems/congestion, N/A N/A Comorbid History: Congestive Heart Failure, Coronary Artery Disease, Hypertension, Myocardial Infarction, Type II Diabetes, Neuropathy 10/01/2022 N/A N/A Date Acquired: 8 N/A N/A Weeks of Treatment: Open N/A N/A Wound Status: No N/A N/A Wound Recurrence: Yes N/A N/A  Pending [name] on Presentation: 1.2x0.8x0.2 N/A N/A Measurements L x W x D (cm) 0.754 N/A N/A A (cm) : rea 0.151 N/A N/A Volume (cm) : 80.80% N/A N/A % Reduction in A rea: 80.80% N/A N/A % Reduction in Volume: Grade 1 N/A N/A Classification: Medium N/A N/A Exudate A mount: Serosanguineous N/A N/A Exudate TypeTYAIRE, LUQUIN ([id number]) [id number] Page 3 of 7 red, brown N/A N/A Exudate Color: Thickened N/A N/A Wound Margin: Large (67-100%) N/A N/A Granulation Amount: Red N/A N/A Granulation Quality: None Present (0%) N/A N/A Necrotic Amount: Fat Layer (Subcutaneous Tissue): Yes N/A N/A Exposed Structures: Fascia: No Tendon: No Muscle: No Joint: No Bone: No Small (1-33%) N/A N/A Epithelialization: Debridement - Selective/Open Wound N/A N/A Debridement: Pre-procedure Verification/Time Out 15:17 N/A N/A Taken: Lidocaine N/A N/A Pain Control: Slough N/A N/A Tissue Debrided: Non-Viable Tissue N/A N/A Level: 0.96 N/A N/A Debridement A (sq cm): rea Curette N/A N/A Instrument: Minimum N/A N/A Bleeding: Pressure N/A N/A Hemostasis A  chieved: 0 N/A N/A Procedural Pain: 0 N/A N/A Post Procedural Pain: Procedure was tolerated well N/A N/A Debridement Treatment Response: 1.2x0.8x0.2 N/A N/A Post Debridement Measurements L x W x D (cm) 0.151 N/A N/A Post Debridement Volume: (cm) Callus: Yes N/A N/A Periwound Skin Texture: Excoriation: No Induration: No Crepitus: No Rash: No Scarring: No Maceration: No N/A N/A Periwound Skin Moisture: Dry/Scaly: No Atrophie Blanche: No N/A N/A Periwound Skin Color: Cyanosis: No Ecchymosis: No Erythema: No Hemosiderin Staining: No Mottled: No Pallor: No Rubor: No No Abnormality N/A N/A Temperature: Debridement N/A N/A Procedures Performed: Treatment Notes Wound #3 (Foot) Wound Laterality: Plantar, Left Cleanser Wound Cleanser Discharge Instruction: Cleanse the wound with wound cleanser prior to applying a clean dressing using [name] sponges, not tissue or cotton balls. Peri-Wound Care Topical Primary Dressing Vashe Discharge Instruction: wet to dry [name]. 2x2 [name] Discharge Instruction: sock [name] with vashe. Secondary Dressing ABD Pad, 5x9 Discharge Instruction: ABD pad donut. Secured With [employer], 4.5x3.1 (in/yd) Discharge Instruction: Secure with Kerlix as directed. 58M Medipore H Soft Cloth Surgical T ape, 4 x 10 (in/yd) Discharge Instruction: Secure with tape as directed. Compression Wrap Compression Stockings Add-Ons AMANTE, KOSEK ([id number]) [id number] Page 4 of 7 Electronic Signature(s) Signed: 03/27/2023 3:28:11 PM By: Kalman Shan DO Entered By: Kalman Shan on 03/27/2023 15:20:40 -------------------------------------------------------------------------------- Multi-Disciplinary Care Plan Details Patient Name: Date of Service: [name], [place] RO LD 03/27/2023 2:45 PM Medical Record Number: [id number] Patient Account Number: [id number] Date of Birth/Sex: Treating RN: 25-Feb-1971 (52 y.o. [name], Lauren Primary Care Necole Minassian: Asencion Noble Other Clinician: Referring Dewitt Judice: Treating Larnie Heart/Extender: Madelynn Done in Treatment: 8 Active Inactive Wound/Skin Impairment Nursing Diagnoses: Impaired tissue integrity Knowledge deficit related to ulceration/compromised skin integrity Goals: Patient will have a decrease in wound volume by X% from date: (specify in notes) Date Initiated: 01/24/2023 Target Resolution Date: 05/02/2023 Goal Status: Active Patient/caregiver will verbalize understanding of skin care regimen Date Initiated: 01/24/2023 Target Resolution Date: 05/02/2023 Goal Status: Active Ulcer/skin breakdown will have a volume reduction of 30% by week 4 Date Initiated: 01/24/2023 Date Inactivated: 03/07/2023 Target Resolution Date: 02/27/2023 Goal Status: Unmet Unmet Reason: larger today. Ulcer/skin breakdown will have a volume reduction of 50% by week 8 Date Initiated: 01/24/2023 Target Resolution Date: 05/02/2023 Goal Status: Active Interventions: Assess patient/caregiver ability to obtain necessary supplies Assess patient/caregiver ability to perform ulcer/skin care regimen upon admission and as needed Assess ulceration(s) every visit Notes: Electronic Signature(s) Signed: 04/02/2023 4:24:30 PM By: Hollie Salk,  Lauren RN Entered By: Rhae Hammock on 03/27/2023 15:19:34 -------------------------------------------------------------------------------- Pain Assessment Details Patient Name: Date of Service: [name] [place], [place] RO LD 03/27/2023 2:45 PM Medical Record Number: [id number] Patient Account Number: [id number] Date of Birth/Sex: Treating RN: 22-Jun-1971 (52 y.o. [name], Lauren Primary Care Lafonda Patron: Asencion Noble Other Clinician: Referring Aimi Essner: Treating Kobie Whidby/Extender: Madelynn Done in Treatment: 8 Active Problems Location of Pain Severity and Description of Pain Patient Has Paino  No Site Locations [place], [place] ([id number]) [id number] Page 5 of 7 Pain Management and Medication Current Pain Management: Electronic Signature(s) Signed: 04/02/2023 4:24:30 PM By: Rhae Hammock RN Entered By: Rhae Hammock on 03/27/2023 14:59:49 -------------------------------------------------------------------------------- Patient/Caregiver Education Details Patient Name: Date of Service: [name], [name] 3/28/2024andnbsp2:45 PM Medical Record Number: [id number] Patient Account Number: [id number] Date of Birth/Gender: Treating RN: 1971-09-08 (52 y.o. [name], Lauren Primary Care Physician: Asencion Noble Other Clinician: Referring Physician: Treating Physician/Extender: Madelynn Done in Treatment: 8 Education Assessment Education Provided To: Patient Education Topics Provided Wound/Skin Impairment: Methods: Explain/Verbal Responses: Reinforcements needed, State content correctly [employer]) Signed: 04/02/2023 4:24:30 PM By: Rhae Hammock RN Entered By: Rhae Hammock on 03/27/2023 15:19:49 -------------------------------------------------------------------------------- Wound Assessment Details Patient Name: Date of Service: [name] 03/27/2023 2:45 PM Medical Record Number: [id number] Patient Account Number: [id number] Date of Birth/Sex: Treating RN: 04-Oct-1971 (52 y.o. [name], Lauren Primary Care Brynlee Pennywell: Asencion Noble Other Clinician: Referring Joandy Burget: Treating Lyvonne Cassell/Extender: Madelynn Done in Treatment: 8 Wound Status Wound Number: 3 Primary Diabetic Wound/Ulcer of the Lower Extremity Etiology: Wound Location: Left, Plantar Foot Wound Open Wounding Event: Gradually Appeared Status: Date Acquired: 10/01/2022 Comorbid Chronic sinus problems/congestion, Congestive Heart Failure, Weeks Of Treatment: 8 History: Coronary Artery  Disease, Hypertension, Myocardial Infarction, Type Clustered Wound: No II Diabetes, Neuropathy JUSTIN, ALCANTAR ([id number]) [phone].pdf Page 6 of 7 II Diabetes, Neuropathy Pending Amputation On Presentation Wound under treatment by Tyshea Imel outside of [facility] Wound Measurements Length: (cm) 1.2 Width: (cm) 0.8 Depth: (cm) 0.2 Area: (cm) 0.754 Volume: (cm) 0.151 % Reduction in Area: 80.8% % Reduction in Volume: 80.8% Epithelialization: Small (1-33%) Tunneling: No Undermining: No Wound Description Classification: Grade 1 Wound Margin: Thickened Exudate Amount: Medium Exudate Type: Serosanguineous Exudate Color: red, brown Foul Odor After Cleansing: No Slough/Fibrino Yes Wound Bed Granulation Amount: Large (67-100%) Exposed Structure Granulation Quality: Red Fascia Exposed: No Necrotic Amount: None Present (0%) Fat Layer (Subcutaneous Tissue) Exposed: Yes Tendon Exposed: No Muscle Exposed: No Joint Exposed: No Bone Exposed: No Periwound Skin Texture Texture Color No Abnormalities Noted: No No Abnormalities Noted: No Callus: Yes Atrophie Blanche: No Crepitus: No Cyanosis: No Excoriation: No Ecchymosis: No Induration: No Erythema: No Rash: No Hemosiderin Staining: No Scarring: No Mottled: No Pallor: No Moisture Rubor: No No Abnormalities Noted: No Dry / Scaly: No Temperature / Pain Maceration: No Temperature: No Abnormality Treatment Notes Wound #3 (Foot) Wound Laterality: Plantar, Left Cleanser Wound Cleanser Discharge Instruction: Cleanse the wound with wound cleanser prior to applying a clean dressing using [name] sponges, not tissue or cotton balls. Peri-Wound Care Topical Primary Dressing Vashe Discharge Instruction: wet to dry [name]. 2x2 [name] Discharge Instruction: sock [name] with vashe. Secondary Dressing ABD Pad, 5x9 RYLEIGH, BEISEL ([id number]) [id number] Page 7 of  7 Discharge Instruction: ABD pad donut. Secured With [employer], 4.5x3.1 (in/yd) Discharge Instruction: Secure with Kerlix as directed. 55M Medipore H Soft Cloth Surgical T ape, 4 x 10 (in/yd) Discharge Instruction: Secure with tape  as directed. Compression Wrap Compression Stockings Add-Ons Electronic Signature(s) Signed: 04/02/2023 4:24:30 PM By: Rhae Hammock RN Entered By: Rhae Hammock on 03/27/2023 15:04:58 -------------------------------------------------------------------------------- Vitals Details Patient Name: Date of Service: [name], [name] 03/27/2023 2:45 PM Medical Record Number: [id number] Patient Account Number: [id number] Date of Birth/Sex: Treating RN: [birth year] (52 y.o. [name], Lauren Primary Care Lovinia Snare: Asencion Noble Other Clinician: Referring Coti Burd: Treating Nicholis Stepanek/Extender: Madelynn Done in Treatment: 8 Vital Signs Time Taken: 14:57 Temperature (F): 98.7 Height (in): 78 Pulse (bpm): 74 Weight (lbs): 275 Respiratory Rate (breaths/min): 17 Body Mass Index (BMI): 31.8 Blood Pressure (mmHg): 114/78 Reference Range: 80 - 120 mg / dl Electronic Signature(s) Signed: 04/02/2023 4:24:30 PM By: Rhae Hammock RN Entered By: Rhae Hammock on 03/27/2023 14:59:44

## 2023-04-03 ENCOUNTER — Ambulatory Visit: Payer: 59 | Admitting: Critical Care Medicine

## 2023-04-10 ENCOUNTER — Encounter (HOSPITAL_BASED_OUTPATIENT_CLINIC_OR_DEPARTMENT_OTHER): Payer: 59 | Attending: Internal Medicine | Admitting: Internal Medicine

## 2023-04-10 DIAGNOSIS — L97522 Non-pressure chronic ulcer of other part of left foot with fat layer exposed: Secondary | ICD-10-CM | POA: Insufficient documentation

## 2023-04-10 DIAGNOSIS — N189 Chronic kidney disease, unspecified: Secondary | ICD-10-CM | POA: Insufficient documentation

## 2023-04-10 DIAGNOSIS — E11621 Type 2 diabetes mellitus with foot ulcer: Secondary | ICD-10-CM | POA: Insufficient documentation

## 2023-04-10 DIAGNOSIS — I2510 Atherosclerotic heart disease of native coronary artery without angina pectoris: Secondary | ICD-10-CM | POA: Diagnosis not present

## 2023-04-10 DIAGNOSIS — E785 Hyperlipidemia, unspecified: Secondary | ICD-10-CM | POA: Insufficient documentation

## 2023-04-10 DIAGNOSIS — Z955 Presence of coronary angioplasty implant and graft: Secondary | ICD-10-CM | POA: Diagnosis not present

## 2023-04-10 DIAGNOSIS — Z7984 Long term (current) use of oral hypoglycemic drugs: Secondary | ICD-10-CM | POA: Diagnosis not present

## 2023-04-10 DIAGNOSIS — Z89422 Acquired absence of other left toe(s): Secondary | ICD-10-CM | POA: Diagnosis not present

## 2023-04-10 DIAGNOSIS — I5042 Chronic combined systolic (congestive) and diastolic (congestive) heart failure: Secondary | ICD-10-CM | POA: Insufficient documentation

## 2023-04-10 DIAGNOSIS — E1122 Type 2 diabetes mellitus with diabetic chronic kidney disease: Secondary | ICD-10-CM | POA: Insufficient documentation

## 2023-04-10 DIAGNOSIS — I130 Hypertensive heart and chronic kidney disease with heart failure and stage 1 through stage 4 chronic kidney disease, or unspecified chronic kidney disease: Secondary | ICD-10-CM | POA: Insufficient documentation

## 2023-04-10 DIAGNOSIS — Z794 Long term (current) use of insulin: Secondary | ICD-10-CM | POA: Insufficient documentation

## 2023-04-15 NOTE — Progress Notes (Signed)
BROGAN, MARTIS ([id number]) [id number] Page 1 of 6 Visit Report for 04/10/2023 Arrival Information Details Patient Name: Date of Service: [name] [place], [name] RO LD 04/10/2023 2:45 PM Medical Record Number: [id number] Patient Account Number: [id number] Date of Birth/Sex: Treating RN: December 27, 1971 (52 y.o. M) Primary Care Nakyiah Kuck: [name] Other Clinician: Referring Kadija Cruzen: Treating Dontavious Emily/Extender: [name] in Treatment: 10 Visit Information History Since Last Visit Added or deleted any medications: No Patient Arrived: Ambulatory Any new allergies or adverse reactions: No Arrival Time: 14:12 Had a fall or experienced change in No Accompanied By: self activities of daily living that may affect Transfer Assistance: None risk of falls: Patient Identification Verified: Yes Signs or symptoms of abuse/neglect since last visito No Secondary Verification Process Completed: Yes Hospitalized since last visit: No Patient Requires Transmission-Based Precautions: No Implantable device outside of the clinic excluding No Patient Has Alerts: Yes cellular tissue based products placed in the center Patient Alerts: 08/2021 ABI:1.2 TBI 0.79 since last visit: Has Dressing in Place as Prescribed: Yes Pain Present Now: No Electronic Signature(s) Signed: 04/11/2023 12:27:54 PM By: Thayer Dallas Entered By: Thayer Dallas on 04/10/2023 14:13:06 -------------------------------------------------------------------------------- Encounter Discharge Information Details Patient Name: Date of Service: [name] 04/10/2023 2:45 PM Medical Record Number: [id number] Patient Account Number: [id number] Date of Birth/Sex: Treating RN: 01-31-1971 (52 y.o. Charlean Merl, Lauren Primary Care Tranesha Lessner: [name] Other Clinician: Referring Garison Genova: Treating Birch Farino/Extender: [name] in Treatment: 10 Encounter  Discharge Information Items Post Procedure Vitals Discharge Condition: Stable Temperature (F): 98.7 Ambulatory Status: Ambulatory Pulse (bpm): 74 Discharge Destination: Home Respiratory Rate (breaths/min): 17 Transportation: Private Auto Blood Pressure (mmHg): 120/80 Accompanied By: self Schedule Follow-up Appointment: Yes Clinical Summary of Care: Patient Declined Electronic Signature(s) Signed: 04/15/2023 11:10:16 AM By: Fonnie Mu RN Entered By: Fonnie Mu on 04/10/2023 15:43:35 -------------------------------------------------------------------------------- Lower Extremity Assessment Details Patient Name: Date of Service: [name] 04/10/2023 2:45 PM Medical Record Number: [id number] Patient Account Number: [id number] Date of Birth/Sex: Treating RN: [birth year] (52 y.o. M) Primary Care Giovany Cosby: [name] Other Clinician: Referring Jovanni Rash: Treating Lylianna Fraiser/Extender: [name] in Treatment: 10 Edema Assessment Assessed: [name]: No] [name]: No] F[LeftVirginia Crews ([id number])] [Right: [id number] Page 2 of 6] Edema: [Left: Ye] [Right: s] Calf Left: Right: Point of Measurement: 43 cm From Medial Instep 38 cm Ankle Left: Right: Point of Measurement: 9 cm From Medial Instep 23 cm Electronic Signature(s) Signed: 04/11/2023 12:27:54 PM By: Thayer Dallas Entered By: Thayer Dallas on 04/10/2023 14:16:28 -------------------------------------------------------------------------------- Multi Wound Chart Details Patient Name: Date of Service: [name], [name] 04/10/2023 2:45 PM Medical Record Number: [id number] Patient Account Number: [id number] Date of Birth/Sex: Treating RN: 11-Nov-1971 (52 y.o. M) Primary Care Rosalba Totty: [name] Other Clinician: Referring Vyncent Overby: Treating Daquan Crapps/Extender: [name] in Treatment: 10 Vital Signs Height(in):  78 Pulse(bpm): 83 Weight(lbs): 275 Blood Pressure(mmHg): 110/78 Body Mass Index(BMI): 31.8 Temperature(F): 97.9 Respiratory Rate(breaths/min): 18 [3:Photos:] [N/A:N/A] Left, Plantar Foot N/A N/A Wound Location: Gradually Appeared N/A N/A Wounding Event: Diabetic Wound/Ulcer of the Lower N/A N/A Primary Etiology: Extremity Chronic sinus problems/congestion, N/A N/A Comorbid History: Congestive Heart Failure, Coronary Artery Disease, Hypertension, Myocardial Infarction, Type II Diabetes, Neuropathy 10/01/2022 N/A N/A Date Acquired: 10 N/A N/A Weeks of Treatment: Open N/A N/A Wound Status: No N/A N/A Wound Recurrence: Yes N/A N/A Pending [name] on Presentation: 1.3x1x0.2 N/A N/A Measurements L x W x D (cm) 1.021 N/A  N/A A (cm) : rea 0.204 N/A N/A Volume (cm) : 74.00% N/A N/A % Reduction in A rea: 74.00% N/A N/A % Reduction in Volume: Grade 1 N/A N/A Classification: Medium N/A N/A Exudate A mount: Serosanguineous N/A N/A Exudate Type: red, brown N/A N/A Exudate Color: Thickened N/A N/A Wound Margin: Large (67-100%) N/A N/A Granulation A mount: Red N/A N/A Granulation Quality: None Present (0%) N/A N/A Necrotic A mount: Fat Layer (Subcutaneous Tissue): Yes N/A N/A Exposed Structures: Fascia: No Tendon: No Muscle: No Joint: No KIE, CALVIN ([id number]) [id number] Page 3 of 6 Bone: No Small (1-33%) N/A N/A Epithelialization: Debridement - Selective/Open Wound N/A N/A Debridement: 15:15 N/A N/A Pre-procedure Verification/Time Out Taken: Lidocaine N/A N/A Pain Control: Slough N/A N/A Tissue Debrided: Skin/Epidermis N/A N/A Level: 1.3 N/A N/A Debridement A (sq cm): rea Curette N/A N/A Instrument: Minimum N/A N/A Bleeding: Pressure N/A N/A Hemostasis A chieved: 0 N/A N/A Procedural Pain: 0 N/A N/A Post Procedural Pain: Procedure was tolerated well N/A N/A Debridement Treatment Response: 1.3x1x0.2  N/A N/A Post Debridement Measurements L x W x D (cm) 0.204 N/A N/A Post Debridement Volume: (cm) Callus: Yes N/A N/A Periwound Skin Texture: Excoriation: No Induration: No Crepitus: No Rash: No Scarring: No Maceration: No N/A N/A Periwound Skin Moisture: Dry/Scaly: No Atrophie Blanche: No N/A N/A Periwound Skin Color: Cyanosis: No Ecchymosis: No Erythema: No Hemosiderin Staining: No Mottled: No Pallor: No Rubor: No No Abnormality N/A N/A Temperature: Debridement N/A N/A Procedures Performed: Treatment Notes Electronic Signature(s) Signed: 04/10/2023 3:28:17 PM By: Geralyn Corwin DO Entered By: Geralyn Corwin on 04/10/2023 15:21:37 -------------------------------------------------------------------------------- Multi-Disciplinary Care Plan Details Patient Name: Date of Service: [name], [name] 04/10/2023 2:45 PM Medical Record Number: [id number] Patient Account Number: [id number] Date of Birth/Sex: Treating RN: 1971-11-08 (52 y.o. Charlean Merl, Lauren Primary Care Pattrick Bady: [name] Other Clinician: Referring Dyshawn Cangelosi: Treating Ysidra Sopher/Extender: [name] in Treatment: 10 Active Inactive Wound/Skin Impairment Nursing Diagnoses: Impaired tissue integrity Knowledge deficit related to ulceration/compromised skin integrity Goals: Patient will have a decrease in wound volume by X% from date: (specify in notes) Date Initiated: 01/24/2023 Target Resolution Date: 05/02/2023 Goal Status: Active Patient/caregiver will verbalize understanding of skin care regimen Date Initiated: 01/24/2023 Target Resolution Date: 05/02/2023 Goal Status: Active Ulcer/skin breakdown will have a volume reduction of 30% by week 4 Date Initiated: 01/24/2023 Date Inactivated: 03/07/2023 Target Resolution Date: 02/27/2023 Goal Status: Unmet Unmet Reason: larger today. Ulcer/skin breakdown will have a volume reduction of 50% by week 8 Date Initiated:  01/24/2023 Target Resolution Date: 05/02/2023 Goal Status: Active ORVIE, CARADINE ([id number]) [id number] Page 4 of 6 Interventions: Assess patient/caregiver ability to obtain necessary supplies Assess patient/caregiver ability to perform ulcer/skin care regimen upon admission and as needed Assess ulceration(s) every visit Notes: Electronic Signature(s) Signed: 04/15/2023 11:10:16 AM By: Fonnie Mu RN Entered By: Fonnie Mu on 04/10/2023 15:42:41 -------------------------------------------------------------------------------- Pain Assessment Details Patient Name: Date of Service: [name], [name] 04/10/2023 2:45 PM Medical Record Number: [id number] Patient Account Number: [id number] Date of Birth/Sex: Treating RN: 05/01/1971 (52 y.o. M) Primary Care Valente Fosberg: [name] Other Clinician: Referring Shanekqua Schaper: Treating Dajha Urquilla/Extender: [name] in Treatment: 10 Active Problems Location of Pain Severity and Description of Pain Patient Has Paino No Site Locations Pain Management and Medication Current Pain Management: Electronic Signature(s) Signed: 04/11/2023 12:27:54 PM By: Thayer Dallas Entered By: Thayer Dallas on 04/10/2023 14:13:31 -------------------------------------------------------------------------------- Patient/Caregiver Education Details Patient Name: Date of Service: [name], [place] RO  LD 4/11/2024andnbsp2:45 PM Medical Record Number: [id number] Patient Account Number: [id number] Date of Birth/Gender: Treating RN: March 17, 1971 (52 y.o. Charlean Merl, Lauren Primary Care Physician: [name] Other Clinician: Referring Physician: Treating Physician/Extender: [name] in Treatment: 10 Education Assessment Education Provided To: Patient Education Topics Provided [place] ([id number]) [id number] Page 5 of 6 Wound/Skin  Impairment: Methods: Explain/Verbal Responses: Reinforcements needed, State content correctly Electronic Signature(s) Signed: 04/15/2023 11:10:16 AM By: Fonnie Mu RN Entered By: Fonnie Mu on 04/10/2023 15:42:51 -------------------------------------------------------------------------------- Wound Assessment Details Patient Name: Date of Service: [name], [name] 04/10/2023 2:45 PM Medical Record Number: [id number] Patient Account Number: [id number] Date of Birth/Sex: Treating RN: 1971/06/08 (52 y.o. M) Primary Care Blessing Zaucha: [name] Other Clinician: Referring Chelsea Pedretti: Treating Zakry Caso/Extender: [name] in Treatment: 10 Wound Status Wound Number: 3 Primary Diabetic Wound/Ulcer of the Lower Extremity Etiology: Wound Location: Left, Plantar Foot Wound Open Wounding Event: Gradually Appeared Status: Date Acquired: 10/01/2022 Comorbid Chronic sinus problems/congestion, Congestive Heart Failure, Weeks Of Treatment: 10 History: Coronary Artery Disease, Hypertension, Myocardial Infarction, Type Clustered Wound: No II Diabetes, Neuropathy Pending Amputation On Presentation Wound under treatment by Kolden Dupee outside of Wound Center Photos Wound Measurements Length: (cm) 1.3 Width: (cm) 1 Depth: (cm) 0.2 Area: (cm) 1.021 Volume: (cm) 0.204 % Reduction in Area: 74% % Reduction in Volume: 74% Epithelialization: Small (1-33%) Tunneling: No Undermining: No Wound Description Classification: Grade 1 Wound Margin: Thickened Exudate Amount: Medium Exudate Type: Serosanguineous Exudate Color: red, brown Foul Odor After Cleansing: No Slough/Fibrino Yes Wound Bed Granulation Amount: Large (67-100%) Exposed Structure Granulation Quality: Red Fascia Exposed: No Necrotic Amount: None Present (0%) Fat Layer (Subcutaneous Tissue) Exposed: Yes Tendon Exposed: No Muscle Exposed: No Joint Exposed: No Bone Exposed: No Periwound  Skin Texture Texture Color No Abnormalities Noted: No No Abnormalities Noted: No Callus: Yes Atrophie Blanche: No Crepitus: No Cyanosis: No SHAWNN, BOUILLON ([id number]) [id number] Page 6 of 6 Excoriation: No Ecchymosis: No Induration: No Erythema: No Rash: No Hemosiderin Staining: No Scarring: No Mottled: No Pallor: No Moisture Rubor: No No Abnormalities Noted: No Dry / Scaly: No Temperature / Pain Maceration: No Temperature: No Abnormality Treatment Notes Wound #3 (Foot) Wound Laterality: Plantar, Left Cleanser Wound Cleanser Discharge Instruction: Cleanse the wound with wound cleanser prior to applying a clean dressing using gauze sponges, not tissue or cotton balls. Peri-Wound Care Zinc Oxide Ointment 30g tube Discharge Instruction: Apply Zinc Oxide to periwound with each dressing change Topical Primary Dressing Vashe Discharge Instruction: wet to dry gauze. 2x2 gauze Discharge Instruction: sock gauze with vashe. Secondary Dressing ABD Pad, 5x9 Discharge Instruction: ABD pad donut. Secured With [employer], 4.5x3.1 (in/yd) Discharge Instruction: Secure with Kerlix as directed. 66M Medipore H Soft Cloth Surgical T ape, 4 x 10 (in/yd) Discharge Instruction: Secure with tape as directed. Compression Wrap Compression Stockings Add-Ons Electronic Signature(s) Signed: 04/11/2023 12:27:54 PM By: Thayer Dallas Entered By: Thayer Dallas on 04/10/2023 14:17:38 -------------------------------------------------------------------------------- Vitals Details Patient Name: Date of Service: [name], [name] 04/10/2023 2:45 PM Medical Record Number: [id number] Patient Account Number: [id number] Date of Birth/Sex: Treating RN: Jan 09, 1971 (52 y.o. M) Primary Care Merlin Ege: [name] Other Clinician: Referring Sophia Sperry: Treating Devone Tousley/Extender: [name] in Treatment: 10 Vital Signs Time Taken:  14:13 Temperature (F): 97.9 Height (in): 78 Pulse (bpm): 83 Weight (lbs): 275 Respiratory Rate (breaths/min): 18 Body Mass Index (BMI): 31.8 Blood Pressure (mmHg): 110/78 Reference Range: 80 - 120 mg / dl Electronic Signature(s)  Signed: 04/11/2023 12:27:54 PM By: Thayer Dallas Entered By: Thayer Dallas on 04/10/2023 14:13:25

## 2023-04-15 NOTE — Progress Notes (Signed)
QUINTEN, ALLERTON ([id number]) [id number] Page 1 of 10 Visit Report for 04/10/2023 Chief Complaint Document Details Patient Name: Date of Service: [name], [name] RO LD 04/10/2023 2:45 PM Medical Record Number: [id number] Patient Account Number: [id number] Date of Birth/Sex: Treating RN: [birth year] (52 y.o. M) Primary Care Provider: Shan [name] Other Clinician: Referring Provider: Treating Provider/Extender: [name] in Treatment: 10 Information Obtained from: Patient Chief Complaint 01/15/2023; left foot wound Electronic Signature(s) Signed: 04/10/2023 3:28:17 PM By: Glenn Corwin DO Entered By: [name] on 04/10/2023 15:21:45 -------------------------------------------------------------------------------- Debridement Details Patient Name: Date of Service: [name] [place], [name] 04/10/2023 2:45 PM Medical Record Number: [id number] Patient Account Number: [id number] Date of Birth/Sex: Treating RN: [birth year] (52 y.o. [name], [name] Primary Care Provider: Shan [name] Other Clinician: Referring Provider: Treating Provider/Extender: [name] in Treatment: 10 Debridement Performed for Assessment: Wound #3 Left,Plantar Foot Performed By: Physician Glenn Corwin, DO Debridement Type: Debridement Severity of Tissue Pre Debridement: Fat layer exposed Level of Consciousness (Pre-procedure): Awake and Alert Pre-procedure Verification/Time Out Yes - 15:15 Taken: Start Time: 15:15 Pain Control: Lidocaine T Area Debrided (L x W): otal 1.3 (cm) x 1 (cm) = 1.3 (cm) Tissue and other material debrided: Viable, Non-Viable, Slough, Skin: Dermis , Skin: Epidermis, Slough Level: Skin/Epidermis Debridement Description: Selective/Open Wound Instrument: Curette Bleeding: Minimum Hemostasis Achieved: Pressure End Time: 15:15 Procedural Pain: 0 Post Procedural Pain: 0 Response to  Treatment: Procedure was tolerated well Level of Consciousness (Post- Awake and Alert procedure): Post Debridement Measurements of Total Wound Length: (cm) 1.3 Width: (cm) 1 Depth: (cm) 0.2 Volume: (cm) 0.204 Character of Wound/Ulcer Post Debridement: Improved Severity of Tissue Post Debridement: Fat layer exposed Post Procedure Diagnosis Same as Pre-procedure Electronic Signature(s) Signed: 04/10/2023 3:28:17 PM By: Glenn Corwin DO Signed: 04/15/2023 11:10:16 AM By: Glenn Mu RN Entered By: [name] on 04/10/2023 15:16:37 [name] ([id number]) [id number] Page 2 of 10 -------------------------------------------------------------------------------- HPI Details Patient Name: Date of Service: [name] [place], [name] RO LD 04/10/2023 2:45 PM Medical Record Number: [id number] Patient Account Number: [id number] Date of Birth/Sex: Treating RN: 06-25-1971 (52 y.o. M) Primary Care Provider: Shan [name] Other Clinician: Referring Provider: Treating Provider/Extender: [name] in Treatment: 10 History of Present Illness HPI Description: ADMISSION 01/11/2020 This is a 52 year old man with uncontrolled type 2 diabetes with a recent hemoglobin A1c earlier this year of 13.4. He is on insulin and glipizide. He does not take his blood sugars at home. He does have a follow-up with primary care later this month I believe on January 27. He tells [place] that roughly a month ago he was walking with a shoe with a hole in his foot. He took the shoe off and there was an open wound at roughly the left fourth met head. This has significant undermining and raised edges. He has not noticed any purulence he does not feel unwell. More recently he was taking skin off the bottom of his foot and has a superficial area on the left fifth met head. He has not been offloading this. The patient was in the ER on 12/20. They gave him Bactroban  which she has been using on the wound and 10 days worth of doxycycline. No x-rays were done. He has not had vascular studies. He is also been using hydrogen peroxide. Past medical history type 2 diabetes uncontrolled, chronic systolic heart failure, coronary artery disease with a history of congestive heart failure with stents. Hypertension hyperlipidemia and chronic  renal insufficiency ABI in this clinic was 1.14 on the left. 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 He is uncertain whether he would be able to work if we put him on some form of restriction 1/19; he is generally doing quite well. Using silver alginate on the wounds. Things actually look better. He has a forefoot offloading boot which she seems to be compliant about. He has support at work to stay off his foot is much as possible which is gratifying. Culture I did last week showed a few Enterococcus faecalis. I am going to put him on Augmentin. I talked about ordering an x-ray in my note last week but that does not seem to have happened. We will review reorder the x-ray this week. 1/26; x-ray reordered last week was negative for osteomyelitis. We are using silver alginate on the wound on the third and fifth met heads on the left. He is using a Darco forefoot off loader 2/2; the area on the fifth met head is closed. Third met head is still open with tunneling depth and thick callus. 2/9; the area on the fifth met head remains closed however the third met head again has a small open area on presentation with tunneling in depth and surrounded by thick callus. This looks like a pressure issue. We have been using silver alginate 2/16; the area of the fifth metatarsal head remains closed however the area over the third metatarsal head again is a small open area but with some depth. I do not think this is changed much since last week. He is using Hydrofera Blue with forefoot off loader. He is not able to use a  total contact cast on the left leg because he needs his left leg at work [employer]]. Fortunately the wound does not look infected. I changed him to endoform today 2/26; the area of the fifth metatarsal head remains closed. The area of the third metatarsal head has an even smaller opening this time. I used endoform on this this looks improved. He is offloading this is much as he can and a forefoot off loader on the left. He cannot have a total contact cast because of work responsibilities 3/5; the area on the fifth metatarsal head remains closed the area on the third metatarsal head is also closed on the left foot. 01/15/2023 Mr. Ibrahem Volkman is a 52 year old male with a past medical history of uncontrolled insulin-dependent type 2 diabetes with self-reported last hemoglobin A1c of 12, previous amputations to his feet bilaterally secondary to osteomyelitis, CAD and chronic combined systolic and diastolic heart failure that presents to the clinic for a 44-month history of nonhealing ulcer to the left lateral foot. He has been following for podiatry for several months for this issue. He is currently using wet-to-dry saline dressings. He currently denies signs of infection. Progresse note from 1/17; Patient presents with a 10-month history of nonhealing ulcer to the left foot secondary to diabetes and inability to offload well the area. He has had multiple debridements in the past to his feet bilaterally. He has had resection of the fourth left met head in the fifth toe Secondary to osteomyelitis. We discussed the importance of glycemic control for wound healing. Due to his blood glucose levels being elevated he is at high risk for infection and thus further amputation. He expressed understanding. He states he is supposed to be referred to an endocrinologist at [facility] however the referral fell through. Unclear  what happened. Offered a referral to endocrinology at [facility]. Patient was  agreeable.Furthermore we discussed the importance of aggressive offloading for his wound healing. This will be the most difficult part of the treatment plan for the patient to do. We discussed a total contact cast however he has declined that at this time. 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 He is currently using a surgical shoe with a peg assist. It does not fit well so we will give him a new one today. I do not think this is enough offloading. If he is not able to offload this area he will likely end up with a BKA. He is well aware of this. For now I recommended Medihoney and Hydrofera Blue for dressing changes. He will follow-up in our [place] office Since this is closer for him. 1/26; patient presents for follow-up. Last clinic visit I had seen him in [place] and we transferred him to [name] since this is a closer location for him. Progress note above from that visit. Over the past week He has been using Medihoney and Hydrofera Blue to the wound bed. He has been using his surgical shoe with peg assist. He has no issues or complaints today. We discussed doing the total contact cast and he was agreeable to having this placed at next clinic visit. 2/2; patient presents for follow-up. He has been using Vashe wet-to-dry dressings. Plan is for the total contact cast today. 2/5; patient presents for follow-up. The cast was placed 3 days ago. He tolerated this well however had a lot of drainage. He currently denies signs of infection. 2/9; patient presents for follow-up. At last clinic visit we held off on replacing the cast due to drainage. He has been using Vashe wet-to-dry dressings and he is currently taking the oral antibiotics prescribed without issues. He declines a total contact cast today. We ran insurance verification for skin substitute and due to cost patient declines having this placed. 2/16; patient  presents for follow-up. He has decided not to follow-up with podiatry. He continues to decline the total contact cast. He has been using Vashe wet- to-dry dressings. He has not been wearing his surgical shoe with peg assist. He currently denies signs of infection. 3/1; patient presents for follow-up. He has been doing Vashe wet-to-dry dressings. He has a surgical shoe with peg assist. We discussed potentially doing a skin substitute for which she has been approved for by insurance. He knows the out-of-pocket cost of this and would still like to proceed with having this BLAIRE, HODSDON ([id number]) [id number] Page 3 of 10 placed today. He denies the current total contact cast. 3/8; patient presents for follow-up. He states that the skin substitute came off after a few days and he has been using Vashe wet-to-dry dressings. He has mild odor to the wound bed on exam. 3/15; patient presents for follow-up. He has been using Vashe wet-to-dry dressings and started his course of Augmentin. He reports improvement in wound healing. He has no issues or complaints today. 3/21; patient presents for follow-up. He continues to use Vashe wet-to-dry dressings. He has no issues or complaints today. 3/28; patient presents for follow-up. He has been using Vashe wet-to-dry dressings. He has no issues or complaints today. He has been using his peg assist with surgical shoe. 4/11; patient presents for follow-up. He has been using Vashe wet-to-dry dressings. The periwound is  macerated. He has been using his peg assist with surgical shoe. He has no issues or complaints today. Electronic Signature(s) Signed: 04/10/2023 3:28:17 PM By: Glenn Corwin DO Entered By: [name] on 04/10/2023 15:22:17 -------------------------------------------------------------------------------- Physical Exam Details Patient Name: Date of Service: [name] [place], [name] 04/10/2023 2:45 PM Medical Record  Number: [id number] Patient Account Number: [id number] Date of Birth/Sex: Treating RN: 1971-06-25 (52 y.o. M) Primary Care Provider: Shan [name] Other Clinician: Referring Provider: Treating Provider/Extender: [name] in Treatment: 10 Constitutional respirations regular, non-labored and within target range for patient.. Cardiovascular 2+ dorsalis pedis/posterior tibialis pulses. Psychiatric pleasant and cooperative. Notes Left foot: T the lateral aspect, fifth met head there is an open wound with granulation tissue And nonviable tissue. Periwound is macerated. No signs of o infection. Electronic Signature(s) Signed: 04/10/2023 3:28:17 PM By: Glenn Corwin DO Entered By: [name] on 04/10/2023 15:23:07 -------------------------------------------------------------------------------- Physician Orders Details Patient Name: Date of Service: [name] [place], [name] 04/10/2023 2:45 PM Medical Record Number: [id number] Patient Account Number: [id number] Date of Birth/Sex: Treating RN: 1971-01-14 (52 y.o. [name], [name] Primary Care Provider: Shan [name] Other Clinician: Referring Provider: Treating Provider/Extender: [name] in Treatment: 10 Verbal / Phone Orders: No Diagnosis Coding Follow-up Appointments ppointment in 2 weeks. - Dr. Mikey Bussing and Maryruth Bun Room # 9 Thurs. 04/24/2023 @ 3:00 Return A Cellular or Tissue Based Products Cellular or Tissue Based Product Type: - apligraf and grafix 50% coinsurance 02/28/23-apply 1st Grafix 03/07/2023 HOLD THIS WEEK. Bathing/ Shower/ Hygiene May shower with protection but do not get wound dressing(s) wet. Protect dressing(s) with water repellant cover (for example, large plastic bag) or a cast cover and may then take shower. NANCY, MANUELE ([id number]) [id number] Page 4 of 10 Off-Loading Other: - keep pressure off left lateral foot;  use peg assist as insert in surgical shoe Additional Orders / Instructions Other: - ice, elevation and ace wrap to foot and ankle to aid in the right medial foot area. Wound Treatment Wound #3 - Foot Wound Laterality: Plantar, Left Cleanser: Wound Cleanser 1 x Per Day/15 Days Discharge Instructions: Cleanse the wound with wound cleanser prior to applying a clean dressing using gauze sponges, not tissue or cotton balls. Peri-Wound Care: Zinc Oxide Ointment 30g tube 1 x Per Day/15 Days Discharge Instructions: Apply Zinc Oxide to periwound with each dressing change Prim Dressing: Vashe 1 x Per Day/15 Days ary Discharge Instructions: wet to dry gauze. Prim Dressing: 2x2 gauze 1 x Per Day/15 Days ary Discharge Instructions: sock gauze with vashe. Secondary Dressing: ABD Pad, 5x9 1 x Per Day/15 Days Discharge Instructions: ABD pad donut. Secured With: [employer], 4.5x3.1 (in/yd) 1 x Per Day/15 Days Discharge Instructions: Secure with Kerlix as directed. Secured With: 21M Medipore H Soft Cloth Surgical T ape, 4 x 10 (in/yd) 1 x Per Day/15 Days Discharge Instructions: Secure with tape as directed. Electronic Signature(s) Signed: 04/10/2023 3:28:17 PM By: Glenn Corwin DO Entered By: [name] on 04/10/2023 15:23:27 -------------------------------------------------------------------------------- Problem List Details Patient Name: Date of Service: [name] [place], [name] 04/10/2023 2:45 PM Medical Record Number: [id number] Patient Account Number: [id number] Date of Birth/Sex: Treating RN: Aug 25, 1971 (52 y.o. M) Primary Care Provider: Shan [name] Other Clinician: Referring Provider: Treating Provider/Extender: [name] in Treatment: 10 Active Problems ICD-10 Encounter Code Description Active Date MDM Diagnosis [phone] Non-pressure chronic ulcer of other part of left foot with fat layer exposed 01/24/2023 No Yes E11.621 Type  2 diabetes  mellitus with foot ulcer 01/24/2023 No Yes Z89.422 Acquired absence of other left toe(s) 01/24/2023 No Yes Inactive Problems Resolved Problems Electronic Signature(s) Signed: 04/10/2023 3:28:17 PM By: Glenn Corwin DO Entered By: [name] on 04/10/2023 15:21:31 [name] ([id number]) [id number] Page 5 of 10 -------------------------------------------------------------------------------- Progress Note Details Patient Name: Date of Service: [name] [place], [name] RO LD 04/10/2023 2:45 PM Medical Record Number: [id number] Patient Account Number: [id number] Date of Birth/Sex: Treating RN: 1971-07-15 (52 y.o. M) Primary Care Provider: Shan [name] Other Clinician: Referring Provider: Treating Provider/Extender: [name] in Treatment: 10 Subjective Chief Complaint Information obtained from Patient 01/15/2023; left foot wound History of Present Illness (HPI) ADMISSION 01/11/2020 This is a 52 year old man with uncontrolled type 2 diabetes with a recent hemoglobin A1c earlier this year of 13.4. He is on insulin and glipizide. He does not take his blood sugars at home. He does have a follow-up with primary care later this month I believe on January 27. He tells [place] that roughly a month ago he was walking with a shoe with a hole in his foot. He took the shoe off and there was an open wound at roughly the left fourth met head. This has significant undermining and raised edges. He has not noticed any purulence he does not feel unwell. More recently he was taking skin off the bottom of his foot and has a superficial area on the left fifth met head. He has not been offloading this. The patient was in the ER on 12/20. They gave him Bactroban which she has been using on the wound and 10 days worth of doxycycline. No x-rays were done. He has not had vascular studies. He is also been using hydrogen peroxide. Past medical history type 2  diabetes uncontrolled, chronic systolic heart failure, coronary artery disease with a history of congestive heart failure with stents. Hypertension hyperlipidemia and chronic renal insufficiency ABI in this clinic was 1.14 on the left. 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 He is uncertain whether he would be able to work if we put him on some form of restriction 1/19; he is generally doing quite well. Using silver alginate on the wounds. Things actually look better. He has a forefoot offloading boot which she seems to be compliant about. He has support at work to stay off his foot is much as possible which is gratifying. Culture I did last week showed a few Enterococcus faecalis. I am going to put him on Augmentin. I talked about ordering an x-ray in my note last week but that does not seem to have happened. We will review reorder the x-ray this week. 1/26; x-ray reordered last week was negative for osteomyelitis. We are using silver alginate on the wound on the third and fifth met heads on the left. He is using a Darco forefoot off loader 2/2; the area on the fifth met head is closed. Third met head is still open with tunneling depth and thick callus. 2/9; the area on the fifth met head remains closed however the third met head again has a small open area on presentation with tunneling in depth and surrounded by thick callus. This looks like a pressure issue. We have been using silver alginate 2/16; the area of the fifth metatarsal head remains closed however the area over the third metatarsal head again is a small open area but with some depth. I do not think this is changed  much since last week. He is using Hydrofera Blue with forefoot off loader. He is not able to use a total contact cast on the left leg because he needs his left leg at work [employer]]. Fortunately the wound does not look infected. I changed him to endoform today 2/26; the area of the fifth  metatarsal head remains closed. The area of the third metatarsal head has an even smaller opening this time. I used endoform on this this looks improved. He is offloading this is much as he can and a forefoot off loader on the left. He cannot have a total contact cast because of work responsibilities 3/5; the area on the fifth metatarsal head remains closed the area on the third metatarsal head is also closed on the left foot. 01/15/2023 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 He has been following for podiatry for several months for this issue. He is currently using wet-to-dry saline dressings. He currently denies signs of infection. Progresse note from 1/17; Patient presents with a 87-month history of nonhealing ulcer to the left foot secondary to diabetes and inability to offload well the area. He has had multiple debridements in the past to his feet bilaterally. He has had resection of the fourth left met head in the fifth toe Secondary to osteomyelitis. We discussed the importance of glycemic control for wound healing. Due to his blood glucose levels being elevated he is at high risk for infection and thus further amputation. He expressed understanding. He states he is supposed to be referred to an endocrinologist at [facility] however the referral fell through. Unclear what happened. Offered a referral to endocrinology at [facility]. Patient was agreeable.Furthermore we discussed the importance of aggressive offloading for his wound healing. This will be the most difficult part of the treatment plan for the patient to do. We discussed a total contact  cast however he has declined that at this time. 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 He is currently using a surgical shoe with a peg assist. It does not fit well so we will give him a new one today. I do not think this is enough offloading. If he is not able to offload this area he will likely end up with a BKA. He is well aware of this. For now I recommended Medihoney and Hydrofera Blue for dressing changes. He will follow-up in our [place] office Since this is closer for him. 1/26; patient presents for follow-up. Last clinic visit I had seen him in [place] and we transferred him to [place] since this is a closer location for him. Progress note above from that visit. Over the past week He has been using Medihoney and Hydrofera Blue to the wound bed. He has been using his surgical shoe with peg assist. He has no issues or complaints today. We discussed doing the total contact cast and he was agreeable to having this placed at next clinic visit. 2/2; patient presents for follow-up. He has been using Vashe wet-to-dry dressings. Plan is for the total contact cast today. 2/5; patient presents for follow-up.  The cast was placed 3 days ago. He tolerated this well however had a lot of drainage. He currently denies signs of infection. 2/9; patient presents for follow-up. At last clinic visit we held off on replacing the cast due to drainage. He has been using Vashe wet-to-dry dressings and he is currently taking the oral antibiotics prescribed without issues. He declines a total contact cast today. We ran insurance verification for skin substitute and due to cost patient declines having this placed. 2/16; patient presents for follow-up. He has decided not to follow-up with podiatry. He continues to decline the total contact cast. He has been using [address], [name] ([id number])  [id number] Page 6 of 10 to-dry dressings. He has not been wearing his surgical shoe with peg assist. He currently denies signs of infection. 3/1; patient presents for follow-up. He has been doing Vashe wet-to-dry dressings. He has a surgical shoe with peg assist. We discussed potentially doing a skin substitute for which she has been approved for by insurance. He knows the out-of-pocket cost of this and would still like to proceed with having this placed today. He denies the current total contact cast. 3/8; patient presents for follow-up. He states that the skin substitute came off after a few days and he has been using Vashe wet-to-dry dressings. He has mild odor to the wound bed on exam. 3/15; patient presents for follow-up. He has been using Vashe wet-to-dry dressings and started his course of Augmentin. He reports improvement in wound healing. He has no issues or complaints today. 3/21; patient presents for follow-up. He continues to use Vashe wet-to-dry dressings. He has no issues or complaints today. 3/28; patient presents for follow-up. He has been using Vashe wet-to-dry dressings. He has no issues or complaints today. He has been using his peg assist with surgical shoe. 4/11; patient presents for follow-up. He has been using Vashe wet-to-dry dressings. The periwound is macerated. He has been using his peg assist with surgical shoe. He has no issues or complaints today. Patient History Information obtained from Patient. Family History Heart Disease - Mother,Father, No family history of Cancer, Diabetes, Hereditary Spherocytosis, Hypertension, Kidney Disease, Lung Disease, Seizures, Stroke, Thyroid Problems, Tuberculosis. Social History Never smoker, Marital Status - Single, Alcohol Use - Rarely, Drug Use - No History, Caffeine Use - Rarely. Medical History Eyes Denies history of Cataracts, Glaucoma, Optic Neuritis Ear/Nose/Mouth/Throat Patient has history  of Chronic sinus problems/congestion Denies history of Middle ear problems Hematologic/Lymphatic Denies history of Anemia, Hemophilia, Human Immunodeficiency Virus, Lymphedema, Sickle Cell Disease Respiratory Denies history of Aspiration, Asthma, Chronic Obstructive Pulmonary Disease (COPD), Pneumothorax, Sleep Apnea, Tuberculosis Cardiovascular Patient has history of Congestive Heart Failure, Coronary Artery Disease, Hypertension, Myocardial Infarction - age 53 Denies history of Angina, Arrhythmia, Deep Vein Thrombosis, Hypotension, Peripheral Arterial Disease, Peripheral Venous Disease, Phlebitis, Vasculitis Gastrointestinal Denies history of Cirrhosis , Colitis, Crohnoos, Hepatitis A, Hepatitis B, Hepatitis C Endocrine Patient has history of Type II Diabetes Genitourinary Denies history of End Stage Renal Disease Immunological Denies history of Lupus Erythematosus, Raynaudoos, Scleroderma Integumentary (Skin) Denies history of History of Burn Musculoskeletal Denies history of Gout, Rheumatoid Arthritis, Osteoarthritis, Osteomyelitis Neurologic Patient has history of Neuropathy Denies history of Dementia, Quadriplegia, Paraplegia, Seizure Disorder Oncologic Denies history of Received Chemotherapy, Received Radiation Psychiatric Denies history of Anorexia/bulimia, Confinement Anxiety Hospitalization/Surgery History - Heart Cath in 2018. Medical A Surgical History Notes nd Cardiovascular Ischemic Cardiomyopathy Genitourinary Renal Insufficiency Objective Constitutional respirations regular, non-labored and within target range for patient.. Vitals Time  Taken: 2:13 PM, Height: 78 in, Weight: 275 lbs, BMI: 31.8, Temperature: 97.9 F, Pulse: 83 bpm, Respiratory Rate: 18 breaths/min, Blood Pressure: 110/78 mmHg. [name], [name] ([id number]) [id number] Page 7 of 10 Cardiovascular 2+ dorsalis pedis/posterior tibialis pulses. Psychiatric pleasant  and cooperative. General Notes: Left foot: T the lateral aspect, fifth met head there is an open wound with granulation tissue And nonviable tissue. Periwound is macerated. No o signs of infection. Integumentary (Hair, Skin) Wound #3 status is Open. Original cause of wound was Gradually Appeared. The date acquired was: 10/01/2022. The wound has been in treatment 10 weeks. The wound is located on the Left,Plantar Foot. The wound measures 1.3cm length x 1cm width x 0.2cm depth; 1.021cm^2 area and 0.204cm^3 volume. There is Fat Layer (Subcutaneous Tissue) exposed. There is no tunneling or undermining noted. There is a medium amount of serosanguineous drainage noted. The wound margin is thickened. There is large (67-100%) red granulation within the wound bed. There is no necrotic tissue within the wound bed. The periwound skin appearance exhibited: Callus. The periwound skin appearance did not exhibit: Crepitus, Excoriation, Induration, Rash, Scarring, Dry/Scaly, Maceration, Atrophie Blanche, Cyanosis, Ecchymosis, Hemosiderin Staining, Mottled, Pallor, Rubor, Erythema. Periwound temperature was noted as No Abnormality. 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 No signs of infection. I debrided nonviable tissue. The periwound is macerated. I recommended he use less than Vashe solution and start using zinc oxide to the periwound. Continue aggressive offloading. Follow-up in 2 weeks. Procedures Wound #3 Pre-procedure diagnosis of Wound #3 is a Diabetic Wound/Ulcer of the Lower Extremity located on the Left,Plantar Foot .Severity of Tissue Pre Debridement is: Fat layer exposed. There was a Selective/Open Wound Skin/Epidermis Debridement with a total area of 1.3 sq cm performed by Glenn Corwin, DO. With the following  instrument(s): Curette to remove Viable and Non-Viable tissue/material. Material removed includes Slough, Skin: Dermis, and Skin: Epidermis after achieving pain control using Lidocaine. No specimens were taken. A time out was conducted at 15:15, prior to the start of the procedure. A Minimum amount of bleeding was controlled with Pressure. The procedure was tolerated well with a pain level of 0 throughout and a pain level of 0 following the procedure. Post Debridement Measurements: 1.3cm length x 1cm width x 0.2cm depth; 0.204cm^3 volume. Character of Wound/Ulcer Post Debridement is improved. Severity of Tissue Post Debridement is: Fat layer exposed. Post procedure Diagnosis Wound #3: Same as Pre-Procedure Plan Follow-up Appointments: Return Appointment in 2 weeks. - Dr. Mikey Bussing and Maryruth Bun Room # 9 Thurs. 04/24/2023 @ 3:00 Cellular or Tissue Based Products: Cellular or Tissue Based Product Type: - apligraf and grafix 50% coinsurance 02/28/23-apply 1st Grafix 03/07/2023 HOLD THIS WEEK. Bathing/ Shower/ Hygiene: May shower with protection but do not get wound dressing(s) wet. Protect dressing(s) with water repellant cover (for example, large plastic bag) or a cast cover and may then take shower. Off-Loading: Other: - keep pressure off left lateral foot; use peg assist as insert in surgical shoe Additional Orders / Instructions: Other: - ice, elevation and ace wrap to foot and ankle to aid in the right medial foot area. WOUND #3: - Foot Wound Laterality: Plantar, Left Cleanser: Wound Cleanser 1 x Per Day/15 Days Discharge Instructions: Cleanse the wound with wound cleanser prior to applying a clean dressing using gauze sponges, not tissue or cotton balls. Peri-Wound Care: Zinc  Oxide Ointment 30g tube 1 x Per Day/15 Days Discharge Instructions: Apply Zinc Oxide to periwound with each dressing change Prim Dressing: Vashe 1 x Per Day/15 Days ary Discharge Instructions: wet to dry gauze. Prim  Dressing: 2x2 gauze 1 x Per Day/15 Days ary Discharge Instructions: sock gauze with vashe. Secondary Dressing: ABD Pad, 5x9 1 x Per Day/15 Days Discharge Instructions: ABD pad donut. Secured With: [employer], 4.5x3.1 (in/yd) 1 x Per Day/15 Days Discharge Instructions: Secure with Kerlix as directed. Secured With: 14M Medipore H Soft Cloth Surgical T ape, 4 x 10 (in/yd) 1 x Per Day/15 Days Discharge Instructions: Secure with tape as directed. [name], [name] ([id number]) [id number] Page 8 of 10 1. In office sharp debridement 2. Vashe wet-to-dry dressings 3. Zinc oxide to the periwound 4. Aggressive offloadingoosurgical shoe with peg assist 5. Follow-up in 2 weeks Electronic Signature(s) Signed: 04/10/2023 3:28:17 PM By: Glenn Corwin DO Entered By: [name] on 04/10/2023 15:24:47 -------------------------------------------------------------------------------- HxROS Details Patient Name: Date of Service: [name] IN, [name] 04/10/2023 2:45 PM Medical Record Number: [id number] Patient Account Number: [id number] Date of Birth/Sex: Treating RN: Mar 12, 1971 (52 y.o. M) Primary Care Provider: Shan [name] Other Clinician: Referring Provider: Treating Provider/Extender: [name] in Treatment: 10 Information Obtained From Patient Eyes Medical History: Negative for: Cataracts; Glaucoma; Optic Neuritis Ear/Nose/Mouth/Throat Medical History: Positive for: Chronic sinus problems/congestion Negative for: Middle ear problems Hematologic/Lymphatic Medical History: Negative for: Anemia; Hemophilia; Human Immunodeficiency Virus; Lymphedema; Sickle Cell Disease Respiratory Medical History: Negative for: Aspiration; Asthma; Chronic Obstructive Pulmonary Disease (COPD); Pneumothorax; Sleep Apnea; Tuberculosis Cardiovascular Medical History: Positive for: Congestive Heart Failure; Coronary Artery Disease;  Hypertension; Myocardial Infarction - age 64 Negative for: Angina; Arrhythmia; Deep Vein Thrombosis; Hypotension; Peripheral Arterial Disease; Peripheral Venous Disease; Phlebitis; Vasculitis Past Medical History Notes: Ischemic Cardiomyopathy Gastrointestinal Medical History: Negative for: Cirrhosis ; Colitis; Crohns; Hepatitis A; Hepatitis B; Hepatitis C Endocrine Medical History: Positive for: Type II Diabetes Time with diabetes: ten years Treated with: Insulin Blood sugar tested every day: No Genitourinary Medical History: Negative for: End Stage Renal Disease Past Medical History Notes: Renal Insufficiency Immunological Medical HistoryEGE, MUCKEY ([id number]) [id number] Page 9 of 10 Negative for: Lupus Erythematosus; Raynauds; Scleroderma Integumentary (Skin) Medical History: Negative for: History of Burn Musculoskeletal Medical History: Negative for: Gout; Rheumatoid Arthritis; Osteoarthritis; Osteomyelitis Neurologic Medical History: Positive for: Neuropathy Negative for: Dementia; Quadriplegia; Paraplegia; Seizure Disorder Oncologic Medical History: Negative for: Received Chemotherapy; Received Radiation Psychiatric Medical History: Negative for: Anorexia/bulimia; Confinement Anxiety HBO Extended History Items Ear/Nose/Mouth/Throat: Chronic sinus problems/congestion Immunizations Pneumococcal Vaccine: Received Pneumococcal Vaccination: No Implantable Devices None Hospitalization / Surgery History Type of Hospitalization/Surgery Heart Cath in 2018 Family and Social History Cancer: No; Diabetes: No; Heart Disease: Yes - Mother,Father; Hereditary Spherocytosis: No; Hypertension: No; Kidney Disease: No; Lung Disease: No; Seizures: No; Stroke: No; Thyroid Problems: No; Tuberculosis: No; Never smoker; Marital Status - Single; Alcohol Use: Rarely; Drug Use: No History; Caffeine Use: Rarely; Financial Concerns: No; Food, Clothing  or Shelter Needs: No; Support System Lacking: No; Transportation Concerns: No Electronic Signature(s) Signed: 04/10/2023 3:28:17 PM By: Glenn Corwin DO Entered By: [name] on 04/10/2023 15:22:23 -------------------------------------------------------------------------------- SuperBill Details Patient Name: Date of Service: [name] IN, [name] 04/10/2023 Medical Record Number: [id number] Patient Account Number: [id number] Date of Birth/Sex: Treating RN: [birth year] (52 y.o. M) Primary Care Provider: Shan [name] Other Clinician: Referring Provider: Treating Provider/Extender: [name] in Treatment: 10 Diagnosis Coding ICD-10 Codes Code Description  Z61.096 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.621 Type 2 diabetes mellitus with foot ulcer Z89.422 Acquired absence of other left toe(s) [name], [name] ([id number]) [id number] Page 10 of 10 Facility Procedures : CPT4 Code: [id number] Description: [phone] - DEBRIDE WOUND 1ST 20 SQ CM OR < ICD-10 Diagnosis Description L97.522 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.621 Type 2 diabetes mellitus with foot ulcer Modifier: Quantity: 1 Physician Procedures : CPT4 Code Description Modifier [id number] - WC PHYS DEBR WO ANESTH 20 SQ CM ICD-10 Diagnosis Description L97.522 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.621 Type 2 diabetes mellitus with foot ulcer Quantity: 1 Electronic Signature(s) Signed: 04/10/2023 3:28:17 PM By: Glenn Corwin DO Entered By: [name] on 04/10/2023 15:24:56

## 2023-04-16 DIAGNOSIS — G4733 Obstructive sleep apnea (adult) (pediatric): Secondary | ICD-10-CM | POA: Diagnosis not present

## 2023-04-17 NOTE — Progress Notes (Signed)
DEAUNDRA, KUTZER ([id number]) [id number] Page 1 of 7 Visit Report for 03/07/2023 Arrival Information Details Patient Name: Date of Service: [name] [place], [name] RO LD 03/07/2023 9:00 A M Medical Record Number: [id number] Patient Account Number: [id number] Date of Birth/Sex: Treating RN: 04/19/1971 (52 y.o. [name] Primary Care Wrangler Penning: [name] Other Clinician: Referring [name]: Treating Tyianna Menefee/Extender: [name] in Treatment: 6 Visit Information History Since Last Visit All ordered tests and consults were completed: Yes Patient Arrived: Ambulatory Added or deleted any medications: No Arrival Time: 08:47 Any new allergies or adverse reactions: No Accompanied By: self Had a fall or experienced change in No Transfer Assistance: None activities of daily living that may affect Patient Identification Verified: Yes risk of falls: Secondary Verification Process Completed: Yes Signs or symptoms of abuse/neglect since last visito No Patient Requires Transmission-Based Precautions: No Hospitalized since last visit: No Patient Has Alerts: Yes Implantable device outside of the clinic excluding No Patient Alerts: 08/2021 ABI:1.2 TBI 0.79 cellular tissue based products placed in the center since last visit: Has Dressing in Place as Prescribed: Yes Pain Present Now: Yes Electronic Signature(s) Signed: 04/17/2023 1:55:51 PM By: Brenton Grills Entered By: Brenton Grills on 03/07/2023 08:48:49 -------------------------------------------------------------------------------- Encounter Discharge Information Details Patient Name: Date of Service: [name], [name] 03/07/2023 9:00 A M Medical Record Number: [id number] Patient Account Number: [id number] Date of Birth/Sex: Treating RN: June 03, 1971 (52 y.o. [name] Primary Care Bianca Vester: [name] Other Clinician: Referring Chestine Belknap: Treating Virgia Kelner/Extender:  [name] in Treatment: 6 Encounter Discharge Information Items Post Procedure Vitals Discharge Condition: Stable Temperature (F): 98.2 Ambulatory Status: Ambulatory Pulse (bpm): 84 Discharge Destination: Home Respiratory Rate (breaths/min): 20 Transportation: Private Auto Blood Pressure (mmHg): 143/101 Accompanied By: self Schedule Follow-up Appointment: Yes Clinical Summary of Care: Electronic Signature(s) Signed: 03/07/2023 3:03:33 PM By: Shawn Stall RN, BSN Entered By: Shawn Stall on 03/07/2023 09:17:29 -------------------------------------------------------------------------------- Lower Extremity Assessment Details Patient Name: Date of Service: [name], [name] 03/07/2023 9:00 A M Medical Record Number: [id number] Patient Account Number: [id number] Date of Birth/Sex: Treating RN: 07-Jan-1971 (52 y.o. [name] Primary Care Damon Hargrove: [name] Other Clinician: Referring Lysha Schrade: Treating Trellis Vanoverbeke/Extender: [name] in Treatment: 6 Edema Assessment F[Left: [name] ([id number])] [name]: [id number] Page 2 of 7] Assessed: [Left: Yes] [Right: No] Edema: [Left: Ye] [Right: s] Calf Left: Right: Point of Measurement: 43 cm From Medial Instep 37 cm Ankle Left: Right: Point of Measurement: 9 cm From Medial Instep 23 cm Vascular Assessment Pulses: Dorsalis Pedis Palpable: [Left:Yes] Notes Pt reports burning sensation Tuesday evening to rt medial ankle. Redness, pain, warmth to touch noted at site. Electronic Signature(s) Signed: 04/17/2023 1:55:51 PM By: Brenton Grills Entered By: Brenton Grills on 03/07/2023 08:58:46 -------------------------------------------------------------------------------- Multi Wound Chart Details Patient Name: Date of Service: [name], [name] 03/07/2023 9:00 A M Medical Record Number: [id number] Patient Account Number:  [id number] Date of Birth/Sex: Treating RN: Mar 24, 1971 (52 y.o. 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 Etiology: Extremity Chronic sinus problems/congestion, Comorbid History: Congestive Heart Failure, Coronary Artery Disease,  Hypertension, Myocardial Infarction, Type II Diabetes, Neuropathy 10/01/2022 Date Acquired: 6 Weeks of  Treatment: Open Wound Status: No Wound Recurrence: Yes Pending [name] on Presentation: 1.5x3x0.1 Measurements L x W x D (cm) 3.534 A (cm) : rea  0.353 Volume (cm) : 10.00% % Reduction in A rea: 55.00% % Reduction in Volume: Grade 1 Classification: Medium Exudate A mount: Serosanguineous Exudate Type: red, brown Exudate Color: Yes Foul Odor A Cleansing: fter No Odor A nticipated Due to Product  Use: Thickened Wound Margin: Large (67-100%) Granulation A mount: Red Granulation Quality:] [N/A:N/A N/A N/A N/A N/A N/A N/A N/A N/A N/A N/A N/A N/A N/A N/A N/A N/A N/A N/A N/A N/A N/A N/A N/A] [name], [name] ([id number]) [3:None Present (0%) Necrotic Amount: Fat Layer (Subcutaneous Tissue): Yes N/A Exposed Structures: Fascia: No Tendon: No Muscle: No Joint: No Bone: No None Epithelialization: Debridement - Excisional Debridement: Pre-procedure Verification/Time Out 09:00  Taken: Lidocaine 4% Topical Solution Pain Control: Callus, Subcutaneous, Slough Tissue Debrided: Skin/Subcutaneous Tissue Level: 4.5 Debridement A (sq cm): rea Curette Instrument: Minimum Bleeding: Pressure Hemostasis A chieved: 0 Procedural Pain: 0  Post Procedural Pain: Procedure  was tolerated well Debridement Treatment Response: 1.5x3x0.1 Post Debridement Measurements L x W x D (cm) 0.353 Post Debridement Volume: (cm) Callus: Yes Periwound Skin Texture: Excoriation: No Induration: No Crepitus: No  Rash: No Scarring: No Maceration: Yes Periwound Skin Moisture: Dry/Scaly: No Atrophie Blanche: No Periwound Skin Color: Cyanosis: No Ecchymosis: No Erythema: No Hemosiderin Staining: No Mottled: No Pallor: No Rubor: No No Abnormality Temperature: Yes  Tenderness on Palpation: blisters noted to periwound. Assessment Notes: Debridement Procedures Performed:] [N/A:N/A N/A N/A N/A N/A N/A N/A N/A N/A N/A N/A N/A N/A N/A N/A N/A N/A N/A N/A N/A N/A N/A N/A] Treatment Notes Wound #3 (Foot) Wound Laterality: Plantar, Left Cleanser Wound Cleanser Discharge Instruction: Cleanse the wound with wound cleanser prior to applying a clean dressing using gauze sponges, not tissue or cotton balls. Peri-Wound Care Topical Primary Dressing Vashe Discharge Instruction: wet to dry gauze. Secondary Dressing Optifoam Non-Adhesive Dressing, 4x4 in Discharge Instruction: Apply over primary dressing FOAM DONUT. Secured With [employer], 4.5x3.1 (in/yd) Discharge Instruction: Secure with Kerlix as directed. 74M Medipore H Soft Cloth Surgical T ape, 4 x 10 (in/yd) Discharge Instruction: Secure with tape as directed. Compression Wrap Compression Stockings Add-Ons Electronic Signature(s) Signed: 03/07/2023 11:02:34 AM By: Geralyn Corwin DO Entered By: Geralyn Corwin on 03/07/2023 09:58:48 [name] ([id number]) [id number] Page 4 of 7 -------------------------------------------------------------------------------- Multi-Disciplinary Care Plan Details Patient Name: Date of Service: [name] [place], [name] RO LD 03/07/2023 9:00 A M Medical Record Number: [id number] Patient Account Number: [id number] Date of Birth/Sex: Treating RN: 03-15-1971 (52 y.o. [name] Primary Care Shaun Runyon: [name] Other Clinician: Referring Jaquell Seddon: Treating Milad Bublitz/Extender: [name] in Treatment: 6 Active Inactive Wound/Skin Impairment Nursing Diagnoses: Impaired tissue integrity Knowledge deficit related to ulceration/compromised skin integrity Goals: Patient will have a decrease in wound volume by X% from date: (specify in notes) Date Initiated: 01/24/2023 Target Resolution Date: 03/29/2023 Goal Status: Active Patient/caregiver will verbalize understanding of skin care regimen Date Initiated: 01/24/2023 Target Resolution Date: 03/28/2023 Goal Status: Active Ulcer/skin breakdown will have a volume reduction of 30% by week 4 Date Initiated: 01/24/2023 Date Inactivated: 03/07/2023 Target Resolution Date: 02/27/2023 Goal Status: Unmet Unmet Reason: larger today. Ulcer/skin breakdown will have a volume reduction of 50% by week 8 Date Initiated: 01/24/2023 Target Resolution Date: 03/28/2023 Goal Status: Active Interventions: Assess patient/caregiver ability to obtain necessary supplies Assess patient/caregiver ability to perform ulcer/skin care regimen upon admission and  as needed Assess ulceration(s) every visit Notes: Electronic Signature(s) Signed: 03/07/2023 3:03:33 PM By: Shawn Stall RN, BSN Entered By: Shawn Stall on 03/07/2023 09:15:21 -------------------------------------------------------------------------------- Pain Assessment Details Patient Name: Date of Service: [name], [name] 03/07/2023 9:00 A M Medical Record Number: [id number] Patient Account Number: [id number] Date of Birth/Sex: Treating RN: 02/11/1971 (52 y.o. [name] Primary Care Greer Wainright: [name] Other Clinician: Referring Reis Goga: Treating Karisha Marlin/Extender: [name] in Treatment: 6 Active Problems Location of Pain Severity and Description of Pain Patient Has Paino Yes Site  Locations Pain Location: [name], [name] ([id number]) [id number] Page 5 of 7 Pain Location: Generalized Pain Rate the pain. Current Pain Level: 3 Character of Pain Describe the Pain: Burning, Sharp, Stabbing Pain Management and Medication Current Pain Management: Medication: No Cold Application: No Rest: No Massage: No Activity: No T.E.N.S.: No Heat Application: No Leg drop or elevation: No Is the Current Pain Management Adequate: Adequate How does your wound impact your activities of daily livingo Sleep: No Bathing: No Appetite: No Relationship With Others: No Bladder Continence: No Emotions: No Bowel Continence: No Work: No Toileting: No Drive: No Dressing: No Hobbies: No Electronic Signature(s) Signed: 04/17/2023 1:55:51 PM By: Brenton Grills Entered By: Brenton Grills on 03/07/2023 08:54:58 -------------------------------------------------------------------------------- Patient/Caregiver Education Details Patient Name: Date of Service: [name], [name] 3/8/2024andnbsp9:00 A M Medical Record Number: [id number] Patient Account Number: [id number] Date of Birth/Gender: Treating RN: June 28, 1971 (52 y.o. [name] Primary Care Physician: [name] Other Clinician: Referring Physician: Treating Physician/Extender: [name] in Treatment: 6 Education Assessment Education Provided To: Patient Education Topics Provided Wound/Skin Impairment: Handouts: Caring for Your Ulcer Methods: Explain/Verbal Responses: Reinforcements needed Electronic Signature(s) Signed: 03/07/2023 3:03:33 PM By: Shawn Stall RN, BSN Entered By: Shawn Stall on 03/07/2023 09:16:16 [name] ([id number]) [id number] Page 6 of 7 -------------------------------------------------------------------------------- Wound Assessment Details Patient Name: Date of Service: [name] [place], [name] RO LD  03/07/2023 9:00 A M Medical Record Number: [id number] Patient Account Number: [id number] Date of Birth/Sex: Treating RN: 1971-07-24 (52 y.o. [name] Primary Care Joshu Furukawa: [name] Other Clinician: Referring Sheniya Garciaperez: Treating Fritzie Prioleau/Extender: [name] in Treatment: 6 Wound Status Wound Number: 3 Primary Diabetic Wound/Ulcer of the Lower Extremity Etiology: Wound Location: Left, Plantar Foot Wound Open Wounding Event: Gradually Appeared Status: Date Acquired: 10/01/2022 Comorbid Chronic sinus problems/congestion, Congestive Heart Failure, Weeks Of Treatment: 6 History: Coronary Artery Disease, Hypertension, Myocardial Infarction, Type Clustered Wound: No II Diabetes, Neuropathy Pending Amputation On Presentation Wound under treatment by Leani Myron outside of Wound Center Wound Measurements Length: (cm) 1.5 Width: (cm) 3 Depth: (cm) 0.1 Area: (cm) 3.534 Volume: (cm) 0.353 % Reduction in Area: 10% % Reduction in Volume: 55% Epithelialization: None Tunneling: No Undermining: No Wound Description Classification: Grade 1 Wound Margin: Thickened Exudate Amount: Medium Exudate Type: Serosanguineous Exudate Color: red, brown Foul Odor After Cleansing: Yes Due to Product Use: No Slough/Fibrino No Wound Bed Granulation Amount: Large (67-100%) Exposed Structure Granulation Quality: Red Fascia Exposed: No Necrotic Amount: None Present (0%) Fat Layer (Subcutaneous Tissue) Exposed: Yes Tendon Exposed: No Muscle Exposed: No Joint Exposed: No Bone Exposed: No Periwound Skin Texture Texture Color No Abnormalities Noted: No No Abnormalities Noted: No Callus: Yes Atrophie Blanche: No Crepitus: No Cyanosis: No Excoriation: No Ecchymosis: No Induration: No Erythema: No Rash: No Hemosiderin Staining: No Scarring: No Mottled: No Pallor: No Moisture Rubor: No No Abnormalities Noted: No Dry / Scaly: No Temperature /  Pain Maceration: Yes Temperature: No Abnormality Tenderness on Palpation: Yes Assessment Notes blisters noted to periwound. Electronic Signature(s) Signed: 04/17/2023 1:55:51 PM By: Brenton Grills Entered By: Brenton Grills on 03/07/2023 08:53:05 -------------------------------------------------------------------------------- Vitals Details Patient Name: Date of Service: [name], [name] 03/07/2023 9:00 A M Medical Record Number: [id number] Patient Account Number: [id number] Date of Birth/Sex: Treating RN: 20-Jan-1971 (52 y.o. [name] Primary Care Yari Szeliga: [name] Other Clinician: Cammie [name] ([id number]) [id number] Page 7 of 7 Referring Brecken Walth: Treating Ziggy Reveles/Extender: [name] in Treatment: 6 Vital Signs Time Taken: 08:48 Temperature (F): 98.2 Height (in): 78 Pulse (bpm): 84 Weight (lbs): 275 Respiratory Rate (breaths/min): 20 Body Mass Index (BMI): 31.8 Blood Pressure (mmHg): 143/101 Reference Range: 80 - 120 mg / dl Electronic Signature(s) Signed: 04/17/2023 1:55:51 PM By: Brenton Grills Entered By: Brenton Grills on 03/07/2023 08:50:35

## 2023-04-17 NOTE — Progress Notes (Signed)
DAQUAWN, SEELMAN ([id number]) [id number] Page 1 of 9 Visit Report for 03/14/2023 Chief Complaint Document Details Patient Name: Date of Service: [name] [place], [name] RO LD 03/14/2023 9:30 A M Medical Record Number: [id number] Patient Account Number: [id number] Date of Birth/Sex: Treating RN: 12/30/1971 (52 y.o. M) Primary Care Provider: [name] Other Clinician: Referring Provider: Treating Provider/Extender: [name] in Treatment: 7 Information Obtained from: Patient Chief Complaint 01/15/2023; left foot wound Electronic Signature(s) Signed: 03/14/2023 12:33:12 PM By: Geralyn Corwin DO Entered By: Geralyn Corwin on 03/14/2023 12:13:32 -------------------------------------------------------------------------------- Debridement Details Patient Name: Date of Service: [name], [name] 03/14/2023 9:30 A M Medical Record Number: [id number] Patient Account Number: [id number] Date of Birth/Sex: Treating RN: 10-14-1971 (52 y.o. [name] Primary Care Provider: [name] Other Clinician: Referring Provider: Treating Provider/Extender: [name] in Treatment: 7 Debridement Performed for Assessment: Wound #3 Left,Plantar Foot Performed By: Physician Geralyn Corwin, DO Debridement Type: Debridement Severity of Tissue Pre Debridement: Fat layer exposed Level of Consciousness (Pre-procedure): Awake and Alert Pre-procedure Verification/Time Out Yes - 09:50 Taken: Start Time: 09:50 T Area Debrided (L x W): otal 1 (cm) x 0.9 (cm) = 0.9 (cm) Tissue and other material debrided: Non-Viable, Callus, Slough, Slough Level: Non-Viable Tissue Debridement Description: Selective/Open Wound Instrument: Curette Bleeding: Minimum Hemostasis Achieved: Pressure Response to Treatment: Procedure was tolerated well Level of Consciousness (Post- Awake and Alert procedure): Post Debridement  Measurements of Total Wound Length: (cm) 1 Width: (cm) 0.9 Depth: (cm) 0.1 Volume: (cm) 0.071 Character of Wound/Ulcer Post Debridement: Improved Severity of Tissue Post Debridement: Fat layer exposed Post Procedure Diagnosis Same as Pre-procedure Notes Scribed for Dr. Mikey Bussing by Brenton Grills RN. Electronic Signature(s) Signed: 03/14/2023 12:33:12 PM By: Geralyn Corwin DO Signed: 04/17/2023 1:55:51 PM By: Brenton Grills Entered By: Brenton Grills on 03/14/2023 09:54:01 [name] ([id number]) [id number] Page 2 of 9 -------------------------------------------------------------------------------- HPI Details Patient Name: Date of Service: [name] [place], [name] RO LD 03/14/2023 9:30 A M Medical Record Number: [id number] Patient Account Number: [id number] Date of Birth/Sex: Treating RN: 01/14/1971 (52 y.o. M) Primary Care Provider: [name] Other Clinician: Referring Provider: Treating Provider/Extender: [name] in Treatment: 7 History of Present Illness HPI Description: ADMISSION 01/11/2020 This is a 52 year old man with uncontrolled type 2 diabetes with a recent hemoglobin A1c earlier this year of 13.4. He is on insulin and glipizide. He does not take his blood sugars at home. He does have a follow-up with primary care later this month I believe on January 27. He tells [place] that roughly a month ago he was walking with a shoe with a hole in his foot. He took the shoe off and there was an open wound at roughly the left fourth met head. This has significant undermining and raised edges. He has not noticed any purulence he does not feel unwell. More recently he was taking skin off the bottom of his foot and has a superficial area on the left fifth met head. He has not been offloading this. The patient was in the ER on 12/20. They gave him Bactroban which she has been using on the wound and 10 days worth of doxycycline. No  x-rays were done. He has not had vascular studies. He is also been using hydrogen peroxide. Past medical history type 2 diabetes uncontrolled, chronic systolic heart failure, coronary artery disease with a history of congestive heart failure with stents. Hypertension hyperlipidemia and chronic renal insufficiency ABI in this clinic  was 1.14 on the left. 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 He is uncertain whether he would be able to work if we put him on some form of restriction 1/19; he is generally doing quite well. Using silver alginate on the wounds. Things actually look better. He has a forefoot offloading boot which she seems to be compliant about. He has support at work to stay off his foot is much as possible which is gratifying. Culture I did last week showed a few Enterococcus faecalis. I am going to put him on Augmentin. I talked about ordering an x-ray in my note last week but that does not seem to have happened. We will review reorder the x-ray this week. 1/26; x-ray reordered last week was negative for osteomyelitis. We are using silver alginate on the wound on the third and fifth met heads on the left. He is using a Darco forefoot off loader 2/2; the area on the fifth met head is closed. Third met head is still open with tunneling depth and thick callus. 2/9; the area on the fifth met head remains closed however the third met head again has a small open area on presentation with tunneling in depth and surrounded by thick callus. This looks like a pressure issue. We have been using silver alginate 2/16; the area of the fifth metatarsal head remains closed however the area over the third metatarsal head again is a small open area but with some depth. I do not think this is changed much since last week. He is using Hydrofera Blue with forefoot off loader. He is not able to use a total contact cast on the left leg because he needs his left leg at work  [employer]]. Fortunately the wound does not look infected. I changed him to endoform today 2/26; the area of the fifth metatarsal head remains closed. The area of the third metatarsal head has an even smaller opening this time. I used endoform on this this looks improved. He is offloading this is much as he can and a forefoot off loader on the left. He cannot have a total contact cast because of work responsibilities 3/5; the area on the fifth metatarsal head remains closed the area on the third metatarsal head is also closed on the left foot. 01/15/2023 Mr. Dareon Nunziato is a 52 year old male with a past medical history of uncontrolled insulin-dependent type 2 diabetes with self-reported last hemoglobin A1c of 12, previous amputations to his feet bilaterally secondary to osteomyelitis, CAD and chronic combined systolic and diastolic heart failure that presents to the clinic for a 54-month history of nonhealing ulcer to the left lateral foot. He has been following for podiatry for several months for this issue. He is currently using wet-to-dry saline dressings. He currently denies signs of infection. Progresse note from 1/17; Patient presents with a 55-month history of nonhealing ulcer to the left foot secondary to diabetes and inability to offload well the area. He has had multiple debridements in the past to his feet bilaterally. He has had resection of the fourth left met head in the fifth toe Secondary to osteomyelitis. We discussed the importance of glycemic control for wound healing. Due to his blood glucose levels being elevated he is at high risk for infection and thus further amputation. He expressed understanding. He states he is supposed to be referred to an endocrinologist at [facility] however the referral fell through. Unclear what happened. Offered a referral to  endocrinology at [facility]. Patient was agreeable.Furthermore we discussed the importance of aggressive offloading for his wound  healing. This will be the most difficult part of the treatment plan for the patient to do. We discussed a total contact cast however he has declined that at this time. 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 He is currently using a surgical shoe with a peg assist. It does not fit well so we will give him a new one today. I do not think this is enough offloading. If he is not able to offload this area he will likely end up with a BKA. He is well aware of this. For now I recommended Medihoney and Hydrofera Blue for dressing changes. He will follow-up in our [place] office Since this is closer for him. 1/26; patient presents for follow-up. Last clinic visit I had seen him in [place] and we transferred him to [place] since this is a closer location for him. Progress note above from that visit. Over the past week He has been using Medihoney and Hydrofera Blue to the wound bed. He has been using his surgical shoe with peg assist. He has no issues or complaints today. We discussed doing the total contact cast and he was agreeable to having this placed at next clinic visit. 2/2; patient presents for follow-up. He has been using Vashe wet-to-dry dressings. Plan is for the total contact cast today. 2/5; patient presents for follow-up. The cast was placed 3 days ago. He tolerated this well however had a lot of drainage. He currently denies signs of infection. 2/9; patient presents for follow-up. At last clinic visit we held off on replacing the cast due to drainage. He has been using Vashe wet-to-dry dressings and he is currently taking the oral antibiotics prescribed without issues. He declines a total contact cast today. We ran insurance verification for skin substitute and due to cost patient declines having this placed. 2/16; patient presents for follow-up. He has decided not to follow-up with podiatry. He continues to  decline the total contact cast. He has been using Vashe wet- to-dry dressings. He has not been wearing his surgical shoe with peg assist. He currently denies signs of infection. 3/1; patient presents for follow-up. He has been doing Vashe wet-to-dry dressings. He has a surgical shoe with peg assist. We discussed potentially doing a skin substitute for which she has been approved for by insurance. He knows the out-of-pocket cost of this and would still like to proceed with having this placed today. He denies the current total contact cast. 3/8; patient presents for follow-up. He states that the skin substitute came off after a few days and he has been using Vashe wet-to-dry dressings. He has SAVANNAH, ERBE ([id number]) [id number] Page 3 of 9 mild odor to the wound bed on exam. 3/15; patient presents for follow-up. He has been using Vashe wet-to-dry dressings and started his course of Augmentin. He reports improvement in wound healing. He has no issues or complaints today. Electronic Signature(s) Signed: 03/14/2023 12:33:12 PM By: Geralyn Corwin DO Entered By: Geralyn Corwin on 03/14/2023 12:13:55 -------------------------------------------------------------------------------- Physical Exam Details Patient Name: Date of Service: [name], [name] 03/14/2023 9:30 A M Medical Record Number: [id number] Patient Account Number: [id number] Date of Birth/Sex: Treating RN: 03/08/1971 (52 y.o. M) Primary Care Provider: [name] Other Clinician: Referring Provider: Treating Provider/Extender: [name]  in Treatment: 7 Constitutional respirations regular, non-labored and within target range for patient.. Cardiovascular 2+ dorsalis pedis/posterior tibialis pulses. Psychiatric pleasant and cooperative. Notes Left foot: T the lateral aspect, fifth met head there is an open wound with granulation tissue And nonviable tissue. No  purulent drainage. No odor on exam. No o obvious increase in warmth or erythema. Overall tissue does not appear healthy. Electronic Signature(s) Signed: 03/14/2023 12:33:12 PM By: Geralyn Corwin DO Entered By: Geralyn Corwin on 03/14/2023 12:14:50 -------------------------------------------------------------------------------- Physician Orders Details Patient Name: Date of Service: [name] 03/14/2023 9:30 A M Medical Record Number: [id number] Patient Account Number: [id number] Date of Birth/Sex: Treating RN: May 01, 1971 (52 y.o. [name] Primary Care Provider: [name] Other Clinician: Referring Provider: Treating Provider/Extender: [name] in Treatment: 7 Verbal / Phone Orders: No Diagnosis Coding Follow-up Appointments ppointment in 1 week. - Dr. Mikey Bussing and Merla Riches # 9 Return A Cellular or Tissue Based Products Cellular or Tissue Based Product Type: - apligraf and grafix 50% coinsurance 02/28/23-apply 1st Grafix 03/07/2023 HOLD THIS WEEK. Bathing/ Shower/ Hygiene May shower with protection but do not get wound dressing(s) wet. Protect dressing(s) with water repellant cover (for example, large plastic bag) or a cast cover and may then take shower. Off-Loading Other: - keep pressure off left lateral foot; use peg assist as insert in surgical shoe Additional Orders / Instructions Other: - ice, elevation and ace wrap to foot and ankle to aid in the right medial foot area. Wound Treatment Wound #3 - Foot Wound Laterality: Plantar, Left Cleanser: Wound Cleanser 1 x Per Day/15 Days Discharge Instructions: Cleanse the wound with wound cleanser prior to applying a clean dressing using gauze sponges, not tissue or cotton balls. ALONTAE, CHALOUX ([id number]) [id number] Page 4 of 9 Prim Dressing: Vashe 1 x Per Day/15 Days ary Discharge Instructions: wet to dry gauze. Secondary Dressing: Optifoam  Non-Adhesive Dressing, 4x4 in 1 x Per Day/15 Days Discharge Instructions: Apply over primary dressing FOAM DONUT. Secured With: [employer], 4.5x3.1 (in/yd) 1 x Per Day/15 Days Discharge Instructions: Secure with Kerlix as directed. Secured With: 40M Medipore H Soft Cloth Surgical T ape, 4 x 10 (in/yd) 1 x Per Day/15 Days Discharge Instructions: Secure with tape as directed. Electronic Signature(s) Signed: 03/14/2023 12:33:12 PM By: Geralyn Corwin DO Entered By: Geralyn Corwin on 03/14/2023 12:14:57 -------------------------------------------------------------------------------- Problem List Details Patient Name: Date of Service: [name], [name] 03/14/2023 9:30 A M Medical Record Number: [id number] Patient Account Number: [id number] Date of Birth/Sex: Treating RN: 06-22-1971 (52 y.o. M) Primary Care Provider: [name] Other Clinician: Referring Provider: Treating Provider/Extender: [name] in Treatment: 7 Active Problems ICD-10 Encounter Code Description Active Date MDM Diagnosis (986) [phone] Non-pressure chronic ulcer of other part of left foot with fat layer exposed 01/24/2023 No Yes E11.621 Type 2 diabetes mellitus with foot ulcer 01/24/2023 No Yes Z89.422 Acquired absence of other left toe(s) 01/24/2023 No Yes Inactive Problems Resolved Problems Electronic Signature(s) Signed: 03/14/2023 12:33:12 PM By: Geralyn Corwin DO Entered By: Geralyn Corwin on 03/14/2023 12:13:20 -------------------------------------------------------------------------------- Progress Note Details Patient Name: Date of Service: [name], [name] 03/14/2023 9:30 A M Medical Record Number: [id number] Patient Account Number: [id number] Date of Birth/Sex: Treating RN: 01/18/1971 (52 y.o. M) Primary Care Provider: [name] Other Clinician: Referring Provider: Treating Provider/Extender: [name] in Treatment:  7 Subjective Chief Complaint Information obtained from Patient JABORI, HENEGAR ([id number]) [id number] Page 5  of 9 01/15/2023; left foot wound History of Present Illness (HPI) ADMISSION 01/11/2020 This is a 52 year old man with uncontrolled type 2 diabetes with a recent hemoglobin A1c earlier this year of 13.4. He is on insulin and glipizide. He does not take his blood sugars at home. He does have a follow-up with primary care later this month I believe on January 27. He tells [place] that roughly a month ago he was walking with a shoe with a hole in his foot. He took the shoe off and there was an open wound at roughly the left fourth met head. This has significant undermining and raised edges. He has not noticed any purulence he does not feel unwell. More recently he was taking skin off the bottom of his foot and has a superficial area on the left fifth met head. He has not been offloading this. The patient was in the ER on 12/20. They gave him Bactroban which she has been using on the wound and 10 days worth of doxycycline. No x-rays were done. He has not had vascular studies. He is also been using hydrogen peroxide. Past medical history type 2 diabetes uncontrolled, chronic systolic heart failure, coronary artery disease with a history of congestive heart failure with stents. Hypertension hyperlipidemia and chronic renal insufficiency ABI in this clinic was 1.14 on the left. 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 He is uncertain whether he would be able to work if we put him on some form of restriction 1/19; he is generally doing quite well. Using silver alginate on the wounds. Things actually look better. He has a forefoot offloading boot which she seems to be compliant about. He has support at work to stay off his foot is much as possible which is gratifying. Culture I did last week showed a few Enterococcus faecalis. I am going to put  him on Augmentin. I talked about ordering an x-ray in my note last week but that does not seem to have happened. We will review reorder the x-ray this week. 1/26; x-ray reordered last week was negative for osteomyelitis. We are using silver alginate on the wound on the third and fifth met heads on the left. He is using a Darco forefoot off loader 2/2; the area on the fifth met head is closed. Third met head is still open with tunneling depth and thick callus. 2/9; the area on the fifth met head remains closed however the third met head again has a small open area on presentation with tunneling in depth and surrounded by thick callus. This looks like a pressure issue. We have been using silver alginate 2/16; the area of the fifth metatarsal head remains closed however the area over the third metatarsal head again is a small open area but with some depth. I do not think this is changed much since last week. He is using Hydrofera Blue with forefoot off loader. He is not able to use a total contact cast on the left leg because he needs his left leg at work [employer]]. Fortunately the wound does not look infected. I changed him to endoform today 2/26; the area of the fifth metatarsal head remains closed. The area of the third metatarsal head has an even smaller opening this time. I used endoform on this this looks improved. He is offloading this is much as he can and a forefoot off loader on the left. He cannot have a total contact cast because of  work responsibilities 3/5; the area on the fifth metatarsal head remains closed the area on the third metatarsal head is also closed on the left foot. 01/15/2023 Mr. 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 He has been following for podiatry for several months for this issue. He is currently using wet-to-dry saline dressings. He currently denies signs of infection. Progresse note from 1/17; Patient presents with a 47-month history of nonhealing ulcer to the left foot secondary to diabetes and inability to offload well the area. He has had multiple debridements in the past to his feet bilaterally. He has had resection of the fourth left met head in the fifth toe Secondary to osteomyelitis. We discussed the importance of glycemic control for wound healing. Due to his blood glucose levels being elevated he is at high risk for infection and thus further amputation. He expressed understanding. He states he is supposed to be referred to an endocrinologist at [facility] however the referral fell through. Unclear what happened. Offered a referral to endocrinology at [facility]. Patient was agreeable.Furthermore we discussed the importance of aggressive offloading for his wound healing. This will be the most difficult part of the treatment plan for the patient to do. We discussed a total contact cast however he has declined that at this time. 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 He is currently using a surgical shoe with a peg assist. It does not fit well so we will give him a new one today. I do not think this is enough offloading. If he is not able to offload this area he will likely end up with a BKA. He is well aware of this. For now I recommended Medihoney and Hydrofera Blue for dressing changes. He will follow-up in our [place] office Since this is closer for him. 1/26; patient presents for follow-up. Last clinic visit I had seen him in [place] and we transferred him to [place] since this is a closer location for him. Progress  note above from that visit. Over the past week He has been using Medihoney and Hydrofera Blue to the wound bed. He has been using his surgical shoe with peg assist. He has no issues or complaints today. We discussed doing the total contact cast and he was agreeable to having this placed at next clinic visit. 2/2; patient presents for follow-up. He has been using Vashe wet-to-dry dressings. Plan is for the total contact cast today. 2/5; patient presents for follow-up. The cast was placed 3 days ago. He tolerated this well however had a lot of drainage. He currently denies signs of infection. 2/9; patient presents for follow-up. At last clinic visit we held off on replacing the cast due to drainage. He has been using Vashe wet-to-dry dressings and he is currently taking the oral antibiotics prescribed without issues. He declines a total contact cast today. We ran insurance verification for skin substitute and due to cost patient declines having this placed. 2/16; patient presents for follow-up. He has decided not to follow-up with podiatry. He continues to decline the total contact cast. He has been using  Vashe wet- to-dry dressings. He has not been wearing his surgical shoe with peg assist. He currently denies signs of infection. 3/1; patient presents for follow-up. He has been doing Vashe wet-to-dry dressings. He has a surgical shoe with peg assist. We discussed potentially doing a skin substitute for which she has been approved for by insurance. He knows the out-of-pocket cost of this and would still like to proceed with having this placed today. He denies the current total contact cast. 3/8; patient presents for follow-up. He states that the skin substitute came off after a few days and he has been using Vashe wet-to-dry dressings. He has mild odor to the wound bed on exam. 3/15; patient presents for follow-up. He has been using Vashe wet-to-dry dressings and started his course of Augmentin. He  reports improvement in wound healing. He has no issues or complaints today. Patient History Information obtained from Patient. Family History Heart Disease - Mother,Father, No family history of Cancer, Diabetes, Hereditary Spherocytosis, Hypertension, Kidney Disease, Lung Disease, Seizures, Stroke, Thyroid Problems, Tuberculosis. MAMORU, TAKESHITA ([id number]) [id number] Page 6 of 9 Social History Never smoker, Marital Status - Single, Alcohol Use - Rarely, Drug Use - No History, Caffeine Use - Rarely. 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 16 Denies history of Angina, Arrhythmia, Deep Vein Thrombosis, Hypotension, Peripheral Arterial Disease, Peripheral Venous Disease, Phlebitis, Vasculitis Gastrointestinal Denies history of Cirrhosis , Colitis, Crohnoos, Hepatitis A, Hepatitis B, Hepatitis C Endocrine Patient has history of Type II Diabetes Genitourinary Denies history of End Stage Renal Disease Immunological Denies history of Lupus Erythematosus, Raynaudoos, Scleroderma Integumentary (Skin) Denies history of History of Burn Musculoskeletal Denies history of Gout, Rheumatoid Arthritis, Osteoarthritis, Osteomyelitis Neurologic Patient has history of Neuropathy Denies history of Dementia, Quadriplegia, Paraplegia, Seizure Disorder Oncologic Denies history of Received Chemotherapy, Received  Radiation Psychiatric Denies history of Anorexia/bulimia, Confinement Anxiety Hospitalization/Surgery History - Heart Cath in 2018. Medical A Surgical History Notes nd Cardiovascular Ischemic Cardiomyopathy Genitourinary Renal Insufficiency Objective Constitutional respirations regular, non-labored and within target range for patient.. Vitals Time Taken: 9:10 AM, Height: 78 in, Weight: 275 lbs, BMI: 31.8, Temperature: 98.2 F, Pulse: 81 bpm, Respiratory Rate: 18 breaths/min, Blood Pressure: 114/79 mmHg. Cardiovascular 2+ dorsalis pedis/posterior tibialis pulses. Psychiatric pleasant and cooperative. General Notes: Left foot: T the lateral aspect, fifth met head there is an open wound with granulation tissue And nonviable tissue. No purulent drainage. No o odor on exam. No obvious increase in warmth or erythema. Overall tissue does not appear healthy. Integumentary (Hair, Skin) Wound #3 status is Open. Original cause of wound was Gradually Appeared. The date acquired was: 10/01/2022. The wound has been in treatment 7 weeks. The wound is located on the Left,Plantar Foot. The wound measures 1cm length x 0.9cm width x 0.3cm depth; 0.707cm^2 area and 0.212cm^3 volume. There is Fat Layer (Subcutaneous Tissue) exposed. There is no tunneling noted, however, there is undermining starting at 11:00 and ending at 12:00 with a maximum distance of 0.3cm. There is a medium amount of serosanguineous drainage noted. The wound margin is thickened. There is large (67-100%) red granulation within the wound bed. There is a small (1-33%) amount  of necrotic tissue within the wound bed including Adherent Slough. The periwound skin appearance exhibited: Callus. The periwound skin appearance did not exhibit: Crepitus, Excoriation, Induration, Rash, Scarring, Dry/Scaly, Maceration, Atrophie Blanche, Cyanosis, Ecchymosis, Hemosiderin Staining, Mottled, Pallor, Rubor, Erythema. Periwound temperature was noted as No  Abnormality. 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 I debrided nonviable tissue. I recommended he complete his course of oral antibiotics. No further need to extend this. He can continue Vashe wet-to-dry dressings. Continue aggressive offloading with peg assist and surgical shoe. Follow-up in 1 week. Procedures Wound #3 Pre-procedure diagnosis of Wound #3 is a Diabetic Wound/Ulcer of the Lower Extremity located on the Left,Plantar Foot .Severity of Tissue Pre Debridement is: Fat layer exposed. There was a Selective/Open Wound Non-Viable Tissue Debridement with a total area of 0.9 sq cm performed by Geralyn Corwin, DO. With the following instrument(s): Curette to remove Non-Viable tissue/material. Material removed includes Callus and Slough and. No specimens were taken. A time out was conducted at 09:50, prior to the start of the procedure. A Minimum amount of bleeding was controlled with Pressure. The procedure was tolerated well. Post Debridement Measurements: 1cm length x 0.9cm width x 0.1cm depth; 0.071cm^3 volume. Character of Wound/Ulcer Post Debridement is improved. Severity of Tissue Post Debridement is: Fat layer exposed. Post procedure Diagnosis Wound #3: Same as Pre-Procedure General Notes: Scribed for Dr. Mikey Bussing by Brenton Grills RN.[name] Plan Follow-up Appointments: Return Appointment in 1 week. - Dr. Mikey Bussing and Merla Riches # 9 Cellular or Tissue Based Products: Cellular or Tissue Based Product Type: - apligraf and grafix 50% coinsurance 02/28/23-apply 1st Grafix 03/07/2023 HOLD THIS WEEK. Bathing/ Shower/ Hygiene: May shower with protection but do not get wound dressing(s) wet.  Protect dressing(s) with water repellant cover (for example, large plastic bag) or a cast cover and may then take shower. Off-Loading: Other: - keep pressure off left lateral foot; use peg assist as insert in surgical shoe Additional Orders / Instructions: Other: - ice, elevation and ace wrap to foot and ankle to aid in the right medial foot area. WOUND #3: - Foot Wound Laterality: Plantar, Left Cleanser: Wound Cleanser 1 x Per Day/15 Days Discharge Instructions: Cleanse the wound with wound cleanser prior to applying a clean dressing using gauze sponges, not tissue or cotton balls. Prim Dressing: Vashe 1 x Per Day/15 Days ary Discharge Instructions: wet to dry gauze. Secondary Dressing: Optifoam Non-Adhesive Dressing, 4x4 in 1 x Per Day/15 Days Discharge Instructions: Apply over primary dressing FOAM DONUT . Secured With: [employer], 4.5x3.1 (in/yd) 1 x Per Day/15 Days Discharge Instructions: Secure with Kerlix as directed. Secured With: 59M Medipore H Soft Cloth Surgical T ape, 4 x 10 (in/yd) 1 x Per Day/15 Days Discharge Instructions: Secure with tape as directed. 1. In office sharp debridement 2. Vashe wet-to-dry dressings 3. Aggressive offloadingoosurgical shoe and peg assist 4. Follow-up in 1 week Electronic Signature(s) Signed: 03/14/2023 12:33:12 PM By: Geralyn Corwin DO Entered By: Geralyn Corwin on 03/14/2023 12:16:30 -------------------------------------------------------------------------------- HxROS Details Patient Name: Date of Service: [name] IN, [name] 03/14/2023 9:30 A M Medical Record Number: [id number] Patient Account Number: [id number] Date of Birth/Sex: Treating RN: 1971-02-16 (52 y.o. M) Primary Care Provider: [name] Other Clinician: Referring Provider: Treating Provider/Extender:  Myna Bright Weeks in Treatment: 7 Information Obtained From Patient ATSUSHI, YOM ([id number])  [id number] Page 8 of 9 Eyes Medical History: Negative for: Cataracts; Glaucoma; Optic Neuritis Ear/Nose/Mouth/Throat Medical History: Positive for: Chronic sinus problems/congestion Negative for: Middle ear problems Hematologic/Lymphatic Medical History: Negative for: Anemia; Hemophilia; Human Immunodeficiency Virus; Lymphedema; Sickle Cell Disease Respiratory Medical History: Negative for: Aspiration; Asthma; Chronic Obstructive Pulmonary Disease (COPD); Pneumothorax; Sleep Apnea; Tuberculosis Cardiovascular Medical History: Positive for: Congestive Heart Failure; Coronary Artery Disease; Hypertension; Myocardial Infarction - age 20 Negative for: Angina; Arrhythmia; Deep Vein Thrombosis; Hypotension; Peripheral Arterial Disease; Peripheral Venous Disease; Phlebitis; Vasculitis Past Medical History Notes: Ischemic Cardiomyopathy Gastrointestinal Medical History: Negative for: Cirrhosis ; Colitis; Crohns; Hepatitis A; Hepatitis B; Hepatitis C Endocrine Medical History: Positive for: Type II Diabetes Time with diabetes: ten years Treated with: Insulin Blood sugar tested every day: No Genitourinary Medical History: Negative for: End Stage Renal Disease Past Medical History Notes: Renal Insufficiency Immunological Medical History: Negative for: Lupus Erythematosus; Raynauds; Scleroderma Integumentary (Skin) Medical History: Negative for: History of Burn Musculoskeletal Medical History: Negative for: Gout; Rheumatoid Arthritis; Osteoarthritis; Osteomyelitis Neurologic Medical History: Positive for: Neuropathy Negative for: Dementia; Quadriplegia; Paraplegia; Seizure Disorder Oncologic Medical History: Negative for: Received Chemotherapy; Received Radiation Psychiatric Medical History: Negative for: Wyn Quaker TAJAY, MUZZY ([id number]) [id number] Page 9 of 9 HBO Extended History  Items Ear/Nose/Mouth/Throat: Chronic sinus problems/congestion Immunizations Pneumococcal Vaccine: Received Pneumococcal Vaccination: No Implantable Devices None Hospitalization / Surgery History Type of Hospitalization/Surgery Heart Cath in 2018 Family and Social History Cancer: No; Diabetes: No; Heart Disease: Yes - Mother,Father; Hereditary Spherocytosis: No; Hypertension: No; Kidney Disease: No; Lung Disease: No; Seizures: No; Stroke: No; Thyroid Problems: No; Tuberculosis: No; Never smoker; Marital Status - Single; Alcohol Use: Rarely; Drug Use: No History; Caffeine Use: Rarely; Financial Concerns: No; Food, Clothing or Shelter Needs: No; Support System Lacking: No; Transportation Concerns: No Electronic Signature(s) Signed: 03/14/2023 12:33:12 PM By: Geralyn Corwin DO Entered By: Geralyn Corwin on 03/14/2023 12:14:05 -------------------------------------------------------------------------------- SuperBill Details Patient Name: Date of Service: [name] IN, [name] 03/14/2023 Medical Record Number: [id number] Patient Account Number: [id number] Date of Birth/Sex: Treating RN: 07-22-1971 (52 y.o. M) Primary Care Provider: [name] Other Clinician: Referring Provider: Treating Provider/Extender: [name] in Treatment: 7 Diagnosis Coding ICD-10 Codes Code Description [phone] 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.621 Type 2 diabetes mellitus with foot ulcer Z89.422 Acquired absence of other left toe(s) Facility Procedures : CPT4 Code: [id number] Description: 97597 - DEBRIDE WOUND 1ST 20 SQ CM OR < ICD-10 Diagnosis Description L97.522 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.621 Type 2 diabetes mellitus with foot ulcer Modifier: Quantity: 1 Physician Procedures : CPT4 Code Description Modifier [id number] - WC PHYS DEBR WO ANESTH 20 SQ CM ICD-10 Diagnosis  Description L97.522 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.621 Type 2 diabetes mellitus with foot ulcer Quantity: 1 Electronic Signature(s) Signed: 03/14/2023 12:33:12 PM By: Geralyn Corwin DO Entered By: Geralyn Corwin on 03/14/2023 12:16:41

## 2023-04-17 NOTE — Progress Notes (Signed)
Glenn, [name] ([id number]) [id number] Page 1 of 7 Visit Report for 03/14/2023 Arrival Information Details Patient Name: Date of Service: [name] [place], [name] RO LD 03/14/2023 9:30 A Glenn Medical Record Number: [id number] Patient Account Number: [id number] Date of Birth/Sex: Treating RN: 11/21/1971 (52 y.o. [name] Primary Care Jeylin Woodmansee: [name] Other Clinician: Referring Woodley Petzold: Treating Janani Chamber/Extender: [name] in Treatment: 7 Visit Information History Since Last Visit All ordered tests and consults were completed: Yes Patient Arrived: Ambulatory Added or deleted any medications: No Arrival Time: 09:08 Any new allergies or adverse reactions: No Accompanied By: self Had a fall or experienced change in No Transfer Assistance: None activities of daily living that may affect Patient Identification Verified: Yes risk of falls: Secondary Verification Process Completed: Yes Signs or symptoms of abuse/neglect since last visito No Patient Requires Transmission-Based Precautions: No Hospitalized since last visit: No Patient Has Alerts: Yes Implantable device outside of the clinic excluding No Patient Alerts: 08/2021 ABI:1.2 TBI 0.79 cellular tissue based products placed in the center since last visit: Has Dressing in Place as Prescribed: Yes Pain Present Now: No Electronic Signature(s) Signed: 04/17/2023 1:55:51 PM By: Brenton Grills Entered By: Brenton Grills on 03/14/2023 09:11:11 -------------------------------------------------------------------------------- Encounter Discharge Information Details Patient Name: Date of Service: [name], [name] 03/14/2023 9:30 A Glenn Medical Record Number: [id number] Patient Account Number: [id number] Date of Birth/Sex: Treating RN: December 05, 1971 (52 y.o. [name] Primary Care Lena Fieldhouse: [name] Other Clinician: Referring Velda Wendt: Treating Jaisha Villacres/Extender:  [name] in Treatment: 7 Encounter Discharge Information Items Post Procedure Vitals Discharge Condition: Stable Temperature (F): 98.2 Ambulatory Status: Ambulatory Pulse (bpm): 81 Discharge Destination: Home Respiratory Rate (breaths/min): 18 Transportation: Private Auto Blood Pressure (mmHg): 114/79 Accompanied By: self Schedule Follow-up Appointment: Yes Clinical Summary of Care: Patient Declined Electronic Signature(s) Signed: 04/17/2023 1:55:51 PM By: Brenton Grills Entered By: Brenton Grills on 03/14/2023 10:28:21 -------------------------------------------------------------------------------- Lower Extremity Assessment Details Patient Name: Date of Service: [name], [name] 03/14/2023 9:30 A Glenn Medical Record Number: [id number] Patient Account Number: [id number] Date of Birth/Sex: Treating RN: December 09, 1971 (52 y.o. [name] Primary Care Erique Kaser: [name] Other Clinician: Referring Jarred Purtee: Treating Surena Welge/Extender: [name] in Treatment: 7 Edema Assessment F[Left: [name] ([id number])] [name]: [id number] Page 2 of 7] Assessed: [Left: No] [Right: No] Edema: [Left: Ye] [Right: s] Calf Left: Right: Point of Measurement: 43 cm From Medial Instep 31.5 cm Ankle Left: Right: Point of Measurement: 9 cm From Medial Instep 22.5 cm Vascular Assessment Pulses: Dorsalis Pedis Palpable: [Left:Yes] Electronic Signature(s) Signed: 04/17/2023 1:55:51 PM By: Brenton Grills Entered By: Brenton Grills on 03/14/2023 09:15:11 -------------------------------------------------------------------------------- Multi Wound Chart Details Patient Name: Date of Service: [name] [place], [name] 03/14/2023 9:30 A Glenn Medical Record Number: [id number] Patient Account Number: [id number] Date of Birth/Sex: Treating RN: 04/11/1971 (52 y.o. Glenn) Primary Care Tremell Reimers: [name] Other  Clinician: Referring Nickia Boesen: Treating Selita Staiger/Extender: [name] in Treatment: 7 Vital Signs Height(in): 78 Pulse(bpm): 81 Weight(lbs): 275 Blood Pressure(mmHg): 114/79 Body Mass Index(BMI): 31.8 Temperature(F): 98.2 Respiratory Rate(breaths/min): 18 [3:Photos:] [N/A:N/A] Left, Plantar Foot N/A N/A Wound Location: Gradually Appeared N/A N/A Wounding Event: Diabetic Wound/Ulcer of the Lower N/A N/A Primary Etiology: Extremity Chronic sinus problems/congestion, N/A N/A Comorbid History: Congestive Heart Failure, Coronary Artery Disease, Hypertension, Myocardial Infarction, Type II Diabetes, Neuropathy 10/01/2022 N/A N/A Date Acquired: 7 N/A N/A Weeks of Treatment: Open N/A N/A Wound Status: No N/A N/A  Wound Recurrence: Yes N/A N/A Pending [name] on Presentation: 1x0.9x0.3 N/A N/A Measurements L x W x D (cm) 0.707 N/A N/A A (cm) : rea 0.212 N/A N/A Volume (cm) : 82.00% N/A N/A % Reduction in A rea: 73.00% N/A N/A % Reduction in Volume: 11 Starting Position 1 (o'clock): 12 Ending Position 1 (o'clock): 0.3 Maximum Distance 1 (cm): Yes N/A N/A UnderminingMAKENZIE, [name] ([id number]) [id number] Page 3 of 7 Grade 1 N/A N/A Classification: Medium N/A N/A Exudate A mount: Serosanguineous N/A N/A Exudate Type: red, brown N/A N/A Exudate Color: Thickened N/A N/A Wound Margin: Large (67-100%) N/A N/A Granulation A mount: Red N/A N/A Granulation Quality: Small (1-33%) N/A N/A Necrotic A mount: Fat Layer (Subcutaneous Tissue): Yes N/A N/A Exposed Structures: Fascia: No Tendon: No Muscle: No Joint: No Bone: No Small (1-33%) N/A N/A Epithelialization: Debridement - Selective/Open Wound N/A N/A Debridement: Pre-procedure Verification/Time Out 09:50 N/A N/A Taken: Callus, Slough N/A N/A Tissue Debrided: Non-Viable Tissue N/A N/A Level: 0.9 N/A N/A Debridement A (sq  cm): rea Curette N/A N/A Instrument: Minimum N/A N/A Bleeding: Pressure N/A N/A Hemostasis A chieved: Procedure was tolerated well N/A N/A Debridement Treatment Response: 1x0.9x0.1 N/A N/A Post Debridement Measurements L x W x D (cm) 0.071 N/A N/A Post Debridement Volume: (cm) Callus: Yes N/A N/A Periwound Skin Texture: Excoriation: No Induration: No Crepitus: No Rash: No Scarring: No Maceration: No N/A N/A Periwound Skin Moisture: Dry/Scaly: No Atrophie Blanche: No N/A N/A Periwound Skin Color: Cyanosis: No Ecchymosis: No Erythema: No Hemosiderin Staining: No Mottled: No Pallor: No Rubor: No No Abnormality N/A N/A Temperature: Debridement N/A N/A Procedures Performed: Treatment Notes Wound #3 (Foot) Wound Laterality: Plantar, Left Cleanser Wound Cleanser Discharge Instruction: Cleanse the wound with wound cleanser prior to applying a clean dressing using gauze sponges, not tissue or cotton balls. Peri-Wound Care Topical Primary Dressing Vashe Discharge Instruction: wet to dry gauze. Secondary Dressing Optifoam Non-Adhesive Dressing, 4x4 in Discharge Instruction: Apply over primary dressing FOAM DONUT. Secured With [employer], 4.5x3.1 (in/yd) Discharge Instruction: Secure with Kerlix as directed. 69M Medipore H Soft Cloth Surgical T ape, 4 x 10 (in/yd) Discharge Instruction: Secure with tape as directed. Compression Wrap Compression Stockings Add-Ons Electronic Signature(s) Signed: 03/14/2023 12:33:12 PM By: Christella Noa ([id number]) PM By: Geralyn Corwin DO [phone].pdf Page 4 of 7 Signed: 03/14/2023 12:33:12 Entered By: Geralyn Corwin on 03/14/2023 12:13:25 -------------------------------------------------------------------------------- Multi-Disciplinary Care Plan Details Patient Name: Date of Service: [name] [place], [name] RO LD 03/14/2023 9:30 A Glenn Medical Record Number: [id number] Patient  Account Number: [id number] Date of Birth/Sex: Treating RN: [birth year] (52 y.o. [name] Primary Care Suleyman Ehrman: [name] Other Clinician: Referring Creig Landin: Treating Bohdi Leeds/Extender: [name] in Treatment: 7 Active Inactive Wound/Skin Impairment Nursing Diagnoses: Impaired tissue integrity Knowledge deficit related to ulceration/compromised skin integrity Goals: Patient will have a decrease in wound volume by X% from date: (specify in notes) Date Initiated: 01/24/2023 Target Resolution Date: 03/29/2023 Goal Status: Active Patient/caregiver will verbalize understanding of skin care regimen Date Initiated: 01/24/2023 Target Resolution Date: 03/28/2023 Goal Status: Active Ulcer/skin breakdown will have a volume reduction of 30% by week 4 Date Initiated: 01/24/2023 Date Inactivated: 03/07/2023 Target Resolution Date: 02/27/2023 Goal Status: Unmet Unmet Reason: larger today. Ulcer/skin breakdown will have a volume reduction of 50% by week 8 Date Initiated: 01/24/2023 Target Resolution Date: 03/28/2023 Goal Status: Active Interventions: Assess patient/caregiver ability to obtain necessary supplies Assess patient/caregiver ability to perform ulcer/skin care regimen upon admission  and as needed Assess ulceration(s) every visit Notes: Electronic Signature(s) Signed: 04/17/2023 1:55:51 PM By: Brenton Grills Entered By: Brenton Grills on 03/14/2023 10:26:15 -------------------------------------------------------------------------------- Pain Assessment Details Patient Name: Date of Service: [name], [name] 03/14/2023 9:30 A Glenn Medical Record Number: [id number] Patient Account Number: [id number] Date of Birth/Sex: Treating RN: 02/28/1971 (52 y.o. [name] Primary Care Bethani Brugger: [name] Other Clinician: Referring Rony Ratz: Treating Tritia Endo/Extender: [name] in Treatment: 7 Active  Problems Location of Pain Severity and Description of Pain Patient Has Paino No Site Locations [place], [place] ([id number]) [id number] Page 5 of 7 Pain Management and Medication Current Pain Management: Electronic Signature(s) Signed: 04/17/2023 1:55:51 PM By: Brenton Grills Entered By: Brenton Grills on 03/14/2023 09:13:38 -------------------------------------------------------------------------------- Patient/Caregiver Education Details Patient Name: Date of Service: [name], [name] 3/15/2024andnbsp9:30 A Glenn Medical Record Number: [id number] Patient Account Number: [id number] Date of Birth/Gender: Treating RN: [birth year] (52 y.o. [name] Primary Care Physician: [name] Other Clinician: Referring Physician: Treating Physician/Extender: [name] in Treatment: 7 Education Assessment Education Provided To: Patient Education Topics Provided Wound/Skin Impairment: Methods: Explain/Verbal Responses: Reinforcements needed, State content correctly Electronic Signature(s) Signed: 04/17/2023 1:55:51 PM By: Brenton Grills Entered By: Brenton Grills on 03/14/2023 10:26:39 -------------------------------------------------------------------------------- Wound Assessment Details Patient Name: Date of Service: [name], [name] 03/14/2023 9:30 A Glenn Medical Record Number: [id number] Patient Account Number: [id number] Date of Birth/Sex: Treating RN: May 12, 1971 (52 y.o. [name] Primary Care Ameet Sandy: [name] Other Clinician: Referring Wilmer Berryhill: Treating Amier Hoyt/Extender: [name] in Treatment: 7 Wound Status Wound Number: 3 Primary Diabetic Wound/Ulcer of the Lower Extremity Etiology: Wound Location: Left, Plantar Foot Wound Open Wounding Event: Gradually Appeared Status: Date Acquired: 10/01/2022 Comorbid Chronic sinus problems/congestion, Congestive Heart  Failure, Weeks Of Treatment: 7 History: Coronary Artery Disease, Hypertension, Myocardial Infarction, Type Clustered Wound: No II Diabetes, Neuropathy [name], [name] ([id number]) [id number] Page 6 of 7 II Diabetes, Neuropathy Pending Amputation On Presentation Wound under treatment by Lavanna Rog outside of Wound Center Photos Wound Measurements Length: (cm) 1 Width: (cm) 0.9 Depth: (cm) 0.3 Area: (cm) 0.707 Volume: (cm) 0.212 % Reduction in Area: 82% % Reduction in Volume: 73% Epithelialization: Small (1-33%) Tunneling: No Undermining: Yes Starting Position (o'clock): 11 Ending Position (o'clock): 12 Maximum Distance: (cm) 0.3 Wound Description Classification: Grade 1 Wound Margin: Thickened Exudate Amount: Medium Exudate Type: Serosanguineous Exudate Color: red, brown Foul Odor After Cleansing: No Slough/Fibrino Yes Wound Bed Granulation Amount: Large (67-100%) Exposed Structure Granulation Quality: Red Fascia Exposed: No Necrotic Amount: Small (1-33%) Fat Layer (Subcutaneous Tissue) Exposed: Yes Necrotic Quality: Adherent Slough Tendon Exposed: No Muscle Exposed: No Joint Exposed: No Bone Exposed: No Periwound Skin Texture Texture Color No Abnormalities Noted: No No Abnormalities Noted: No Callus: Yes Atrophie Blanche: No Crepitus: No Cyanosis: No Excoriation: No Ecchymosis: No Induration: No Erythema: No Rash: No Hemosiderin Staining: No Scarring: No Mottled: No Pallor: No Moisture Rubor: No No Abnormalities Noted: No Dry / Scaly: No Temperature / Pain Maceration: No Temperature: No Abnormality Electronic Signature(s) Signed: 03/14/2023 4:34:51 PM By: [name] Signed: 04/17/2023 1:55:51 PM By: Brenton Grills Entered By: [name] on 03/14/2023 09:19:20 -------------------------------------------------------------------------------- Vitals Details Patient Name: Date of Service: [name] [place], [name]  03/14/2023 9:30 A Glenn Medical Record Number: [id number] Patient Account Number: [id number] Date of Birth/Sex: Treating RN: Aug 20, 1971 (52 y.o. [name] Primary Care [name]: [name] Other Clinician: Referring Brittaney Beaulieu: Treating Shaye Lagace/Extender: Mikey Bussing  [name], [name] ([id number]) [id number] Page 7 of 7 Weeks in Treatment: 7 Vital Signs Time Taken: 09:10 Temperature (F): 98.2 Height (in): 78 Pulse (bpm): 81 Weight (lbs): 275 Respiratory Rate (breaths/min): 18 Body Mass Index (BMI): 31.8 Blood Pressure (mmHg): 114/79 Reference Range: 80 - 120 mg / dl Electronic Signature(s) Signed: 04/17/2023 1:55:51 PM By: Brenton Grills Entered By: Brenton Grills on 03/14/2023 09:13:29

## 2023-04-24 ENCOUNTER — Other Ambulatory Visit: Payer: Self-pay

## 2023-04-24 ENCOUNTER — Encounter (HOSPITAL_BASED_OUTPATIENT_CLINIC_OR_DEPARTMENT_OTHER): Payer: 59 | Admitting: Internal Medicine

## 2023-04-24 ENCOUNTER — Other Ambulatory Visit: Payer: Self-pay | Admitting: Critical Care Medicine

## 2023-04-24 DIAGNOSIS — N189 Chronic kidney disease, unspecified: Secondary | ICD-10-CM | POA: Diagnosis not present

## 2023-04-24 DIAGNOSIS — Z955 Presence of coronary angioplasty implant and graft: Secondary | ICD-10-CM | POA: Diagnosis not present

## 2023-04-24 DIAGNOSIS — E11621 Type 2 diabetes mellitus with foot ulcer: Secondary | ICD-10-CM

## 2023-04-24 DIAGNOSIS — I2510 Atherosclerotic heart disease of native coronary artery without angina pectoris: Secondary | ICD-10-CM | POA: Diagnosis not present

## 2023-04-24 DIAGNOSIS — L97522 Non-pressure chronic ulcer of other part of left foot with fat layer exposed: Secondary | ICD-10-CM | POA: Diagnosis not present

## 2023-04-24 DIAGNOSIS — E1122 Type 2 diabetes mellitus with diabetic chronic kidney disease: Secondary | ICD-10-CM | POA: Diagnosis not present

## 2023-04-24 DIAGNOSIS — E785 Hyperlipidemia, unspecified: Secondary | ICD-10-CM | POA: Diagnosis not present

## 2023-04-24 DIAGNOSIS — I5042 Chronic combined systolic (congestive) and diastolic (congestive) heart failure: Secondary | ICD-10-CM | POA: Diagnosis not present

## 2023-04-24 DIAGNOSIS — Z89422 Acquired absence of other left toe(s): Secondary | ICD-10-CM | POA: Diagnosis not present

## 2023-04-24 DIAGNOSIS — I130 Hypertensive heart and chronic kidney disease with heart failure and stage 1 through stage 4 chronic kidney disease, or unspecified chronic kidney disease: Secondary | ICD-10-CM | POA: Diagnosis not present

## 2023-04-24 DIAGNOSIS — Z794 Long term (current) use of insulin: Secondary | ICD-10-CM | POA: Diagnosis not present

## 2023-04-24 DIAGNOSIS — Z7984 Long term (current) use of oral hypoglycemic drugs: Secondary | ICD-10-CM | POA: Diagnosis not present

## 2023-04-24 MED ORDER — OMEPRAZOLE 20 MG PO CPDR
20.0000 mg | DELAYED_RELEASE_CAPSULE | Freq: Every day | ORAL | 0 refills | Status: DC
  Filled 2023-04-24: qty 90, 90d supply, fill #0

## 2023-04-24 MED ORDER — BASAGLAR KWIKPEN 100 UNIT/ML ~~LOC~~ SOPN
50.0000 [IU] | PEN_INJECTOR | Freq: Every day | SUBCUTANEOUS | 2 refills | Status: DC
  Filled 2023-04-24: qty 15, 30d supply, fill #0

## 2023-04-24 NOTE — Telephone Encounter (Signed)
Requested Prescriptions  Pending Prescriptions Disp Refills   Insulin Glargine (BASAGLAR KWIKPEN) 100 UNIT/ML 15 mL 2    Sig: Inject 50 Units into the skin daily.     Endocrinology:  Diabetes - Insulins Failed - 04/24/2023  7:54 AM      Failed - HBA1C is between 0 and 7.9 and within 180 days    HbA1c, POC (controlled diabetic range)  Date Value Ref Range Status  11/28/2022 12.5 (A) 0.0 - 7.0 % Final         Passed - Valid encounter within last 6 months    Recent Outpatient Visits           4 months ago Uncontrolled type 2 diabetes mellitus with hyperglycemia [facility])   [place] [facility] Storm Frisk, MD   8 months ago Uncontrolled type 2 diabetes mellitus with hyperglycemia [facility])   [place] [facility] Storm Frisk, MD   11 months ago Diabetes mellitus type 2 in obese [facility])   [place] [facility] Storm Frisk, MD   1 year ago Upper respiratory tract infection, unspecified type   [place] [facility] [place], [place] W, NP   1 year ago Controlled type 2 diabetes mellitus with stage 3 chronic kidney disease, with long-term current use of insulin [facility])   [place] [facility] Storm Frisk, MD       Future Appointments             In 1 week Hoy Register, MD [facility]   In 6 months Shamleffer, Konrad Dolores, MD [place] [facility]             omeprazole (PRILOSEC) 20 MG capsule 90 capsule 0    Sig: Take 1 capsule (20 mg total) by mouth daily.     Gastroenterology: Proton Pump Inhibitors Passed - 04/24/2023  7:54 AM      Passed - Valid encounter within last 12 months    Recent Outpatient Visits           4 months ago Uncontrolled type 2 diabetes mellitus with hyperglycemia [facility])   [place] [facility] Storm Frisk, MD   8 months ago  Uncontrolled type 2 diabetes mellitus with hyperglycemia [facility])   [place] [facility] Storm Frisk, MD   11 months ago Diabetes mellitus type 2 in obese [facility])   [name] [facility] Storm Frisk, MD   1 year ago Upper respiratory tract infection, unspecified type   [facility] [place], Shea Stakes, NP   1 year ago Controlled type 2 diabetes mellitus with stage 3 chronic kidney disease, with long-term current use of insulin [facility])   [place] [facility] Storm Frisk, MD       Future Appointments             In 1 week Hoy Register, MD [facility]   In 6 months Shamleffer, Konrad Dolores, MD [facility]

## 2023-04-26 NOTE — Progress Notes (Signed)
CASTULO, SCARPELLI ([id number]) [id number] Page 1 of 7 Visit Report for 04/24/2023 Arrival Information Details Patient Name: Date of Service: [name] [name], [name] RO LD 04/24/2023 3:00 PM Medical Record Number: [id number] Patient Account Number: [id number] Date of Birth/Sex: Treating RN: [birth year] (52 y.o. M) Primary [name] [name]: [name] Other Clinician: Referring Kaysi Ourada: Treating Dandrea Widdowson/Extender: [name] in Treatment: 12 Visit Information History Since Last Visit Added or deleted any medications: No Patient Arrived: Ambulatory Any new allergies or adverse reactions: No Arrival Time: 14:39 Had a fall or experienced change in No Accompanied By: self activities of daily living that may affect Transfer Assistance: None risk of falls: Patient Identification Verified: Yes Signs or symptoms of abuse/neglect since last visito No Secondary Verification Process Completed: Yes Hospitalized since last visit: No Patient Requires Transmission-Based Precautions: No Implantable device outside of the clinic excluding No Patient Has Alerts: Yes cellular tissue based products placed in the center Patient Alerts: 08/2021 ABI:1.2 TBI 0.79 since last visit: Has Dressing in Place as Prescribed: Yes Pain Present Now: No Electronic Signature(s) Signed: 04/24/2023 4:28:35 PM By: Thayer Dallas Entered By: Thayer Dallas on 04/24/2023 14:43:53 -------------------------------------------------------------------------------- Encounter Discharge Information Details Patient Name: Date of Service: [name] [place], [name] 04/24/2023 3:00 PM Medical Record Number: [id number] Patient Account Number: [id number] Date of Birth/Sex: Treating RN: Sep 02, 1971 (52 y.o. Charlean Merl, [name] [name]: [name] Other Clinician: Referring Makari Sanko: Treating Carnisha Feltz/Extender: [name] in Treatment: 12 Encounter  Discharge Information Items Post Procedure Vitals Discharge Condition: Stable Temperature (F): 98.7 Ambulatory Status: Ambulatory Pulse (bpm): 74 Discharge Destination: Home Respiratory Rate (breaths/min): 17 Transportation: Private Auto Blood Pressure (mmHg): 134/74 Accompanied By: self Schedule Follow-up Appointment: Yes Clinical Summary of [name]: Patient Declined Electronic Signature(s) Signed: 04/25/2023 12:17:26 PM By: Fonnie Mu RN Entered By: Fonnie Mu on 04/24/2023 15:19:01 -------------------------------------------------------------------------------- Lower Extremity Assessment Details Patient Name: Date of Service: [name] [place], [name] 04/24/2023 3:00 PM Medical Record Number: [id number] Patient Account Number: [id number] Date of Birth/Sex: Treating RN: [birth year] (52 y.o. M) Primary [name]: [name] Other Clinician: Referring Lynzi Meulemans: Treating Cyrene Gharibian/Extender: [name] in Treatment: 12 Edema Assessment Assessed: [name]: No] [name]: No] F[LeftVirginia [name] ([id number])] [Right: [id number] Page 2 of 7] Edema: [Left: Ye] [Right: s] Calf Left: Right: Point of Measurement: 43 cm From Medial Instep 39 cm Ankle Left: Right: Point of Measurement: 9 cm From Medial Instep 23 cm Electronic Signature(s) Signed: 04/24/2023 4:28:35 PM By: Thayer Dallas Entered By: Thayer Dallas on 04/24/2023 14:48:48 -------------------------------------------------------------------------------- Multi Wound Chart Details Patient Name: Date of Service: [name] [place], [name] 04/24/2023 3:00 PM Medical Record Number: [id number] Patient Account Number: [id number] Date of Birth/Sex: Treating RN: [birth year] (52 y.o. M) Primary [name]: [name] Other Clinician: Referring Leela Vanbrocklin: Treating Candace Ramus/Extender: [name] in Treatment: 12 Vital Signs Height(in):  78 Pulse(bpm): 87 Weight(lbs): 275 Blood Pressure(mmHg): 104/70 Body Mass Index(BMI): 31.8 Temperature(F): 98.2 Respiratory Rate(breaths/min): 18 [3:Photos:] [N/A:N/A] Left, Plantar Foot N/A N/A Wound Location: Gradually Appeared N/A N/A Wounding Event: Diabetic Wound/Ulcer of the Lower N/A N/A Primary Etiology: Extremity Chronic sinus problems/congestion, N/A N/A Comorbid History: Congestive Heart Failure, Coronary Artery Disease, Hypertension, Myocardial Infarction, Type II Diabetes, Neuropathy 10/01/2022 N/A N/A Date Acquired: 12 N/A N/A Weeks of Treatment: Open N/A N/A Wound Status: No N/A N/A Wound Recurrence: Yes N/A N/A Pending [name] on Presentation: 1x0.9x0.2 N/A N/A Measurements L x W x D (cm) 0.707 N/A  N/A A (cm) : rea 0.141 N/A N/A Volume (cm) : 82.00% N/A N/A % Reduction in A rea: 82.00% N/A N/A % Reduction in Volume: Grade 1 N/A N/A Classification: Medium N/A N/A Exudate A mount: Serosanguineous N/A N/A Exudate Type: red, brown N/A N/A Exudate Color: Thickened N/A N/A Wound Margin: Large (67-100%) N/A N/A Granulation A mount: Red N/A N/A Granulation Quality: None Present (0%) N/A N/A Necrotic A mount: Fat Layer (Subcutaneous Tissue): Yes N/A N/A Exposed Structures: Fascia: No Tendon: No Muscle: No Joint: No [name] ([id number]) [id number] Page 3 of 7 Bone: No Small (1-33%) N/A N/A Epithelialization: Debridement - Selective/Open Wound N/A N/A Debridement: 15:15 N/A N/A Pre-procedure Verification/Time Out Taken: Lidocaine N/A N/A Pain Control: Slough N/A N/A Tissue Debrided: Non-Viable Tissue N/A N/A Level: 0.71 N/A N/A Debridement A (sq cm): rea Curette N/A N/A Instrument: Minimum N/A N/A Bleeding: Pressure N/A N/A Hemostasis A chieved: 0 N/A N/A Procedural Pain: 0 N/A N/A Post Procedural Pain: Procedure was tolerated well N/A N/A Debridement Treatment  Response: 1x0.9x0.2 N/A N/A Post Debridement Measurements L x W x D (cm) 0.141 N/A N/A Post Debridement Volume: (cm) Callus: Yes N/A N/A Periwound Skin Texture: Excoriation: No Induration: No Crepitus: No Rash: No Scarring: No Maceration: No N/A N/A Periwound Skin Moisture: Dry/Scaly: No Atrophie Blanche: No N/A N/A Periwound Skin Color: Cyanosis: No Ecchymosis: No Erythema: No Hemosiderin Staining: No Mottled: No Pallor: No Rubor: No No Abnormality N/A N/A Temperature: Debridement N/A N/A Procedures Performed: Treatment Notes Wound #3 (Foot) Wound Laterality: Plantar, Left Cleanser Wound Cleanser Discharge Instruction: Cleanse the wound with wound cleanser prior to applying a clean dressing using gauze sponges, not tissue or cotton balls. Peri-Wound [name] Zinc Oxide Ointment 30g tube Discharge Instruction: Apply Zinc Oxide to periwound with each dressing change Topical Primary Dressing Vashe Discharge Instruction: wet to dry gauze. 2x2 gauze Discharge Instruction: sock gauze with vashe. Secondary Dressing ABD Pad, 5x9 Discharge Instruction: ABD pad donut. Secured With [employer], 4.5x3.1 (in/yd) Discharge Instruction: Secure with Kerlix as directed. 52M Medipore H Soft Cloth Surgical T ape, 4 x 10 (in/yd) Discharge Instruction: Secure with tape as directed. Compression Wrap Compression Stockings Add-Ons Electronic Signature(s) Signed: 04/24/2023 3:59:36 PM By: Geralyn Corwin DO Entered By: Geralyn Corwin on 04/24/2023 15:31:00 [name] ([id number]) [id number] Page 4 of 7 -------------------------------------------------------------------------------- Multi-Disciplinary [name] Plan Details Patient Name: Date of Service: [name] [place], [name] RO LD 04/24/2023 3:00 PM Medical Record Number: [id number] Patient Account Number: [id number] Date of Birth/Sex: Treating RN: 27-Nov-1971 (52 y.o. Charlean Merl, [name]  Margo Lama: [name] Other Clinician: Referring Mujahid Jalomo: Treating Madisen Ludvigsen/Extender: [name] in Treatment: 12 Active Inactive Wound/Skin Impairment Nursing Diagnoses: Impaired tissue integrity Knowledge deficit related to ulceration/compromised skin integrity Goals: Patient will have a decrease in wound volume by X% from date: (specify in notes) Date Initiated: 01/24/2023 Target Resolution Date: 05/02/2023 Goal Status: Active Patient/caregiver will verbalize understanding of skin [name] regimen Date Initiated: 01/24/2023 Target Resolution Date: 05/02/2023 Goal Status: Active Ulcer/skin breakdown will have a volume reduction of 30% by week 4 Date Initiated: 01/24/2023 Date Inactivated: 03/07/2023 Target Resolution Date: 02/27/2023 Goal Status: Unmet Unmet Reason: larger today. Ulcer/skin breakdown will have a volume reduction of 50% by week 8 Date Initiated: 01/24/2023 Target Resolution Date: 05/02/2023 Goal Status: Active Interventions: Assess patient/caregiver ability to obtain necessary supplies Assess patient/caregiver ability to perform ulcer/skin [name] regimen upon admission and as needed Assess ulceration(s) every visit Notes: Electronic Signature(s) Signed: 04/25/2023 12:17:26 PM By:  Fonnie Mu RN Entered By: Fonnie Mu on 04/24/2023 15:17:24 -------------------------------------------------------------------------------- Pain Assessment Details Patient Name: Date of Service: [name] [place], [name] RO LD 04/24/2023 3:00 PM Medical Record Number: [id number] Patient Account Number: [id number] Date of Birth/Sex: Treating RN: 1971-09-24 (51 y.o. M) Primary [name] Renaye Janicki: [name] Other Clinician: Referring Alexandros Ewan: Treating Gwendolynn Merkey/Extender: [name] in Treatment: 12 Active Problems Location of Pain Severity and Description of Pain Patient Has Paino No Site Locations [place], [place] ([id number])  [id number] Page 5 of 7 Pain Management and Medication Current Pain Management: Electronic Signature(s) Signed: 04/24/2023 4:28:35 PM By: Thayer Dallas Entered By: Thayer Dallas on 04/24/2023 14:44:24 -------------------------------------------------------------------------------- Patient/Caregiver Education Details Patient Name: Date of Service: [name] [place], [name] 4/25/2024andnbsp3:00 PM Medical Record Number: [id number] Patient Account Number: [id number] Date of Birth/Gender: Treating RN: Dec 25, 1971 (52 y.o. Lucious Groves Primary [name] Physician: [name] Other Clinician: Referring Physician: Treating Physician/Extender: [name] in Treatment: 12 Education Assessment Education Provided To: Patient Education Topics Provided Wound/Skin Impairment: Methods: Explain/Verbal Responses: Reinforcements needed, State content correctly [employer]) Signed: 04/25/2023 12:17:26 PM By: Fonnie Mu RN Entered By: Fonnie Mu on 04/24/2023 15:17:37 -------------------------------------------------------------------------------- Wound Assessment Details Patient Name: Date of Service: [name] [place], [name] 04/24/2023 3:00 PM Medical Record Number: [id number] Patient Account Number: [id number] Date of Birth/Sex: Treating RN: 21-Dec-1971 (52 y.o. M) Primary [name] Donyelle Enyeart: [name] Other Clinician: Referring Kaleeyah Cuffie: Treating Mukund Weinreb/Extender: [name] in Treatment: 12 Wound Status Wound Number: 3 Primary Diabetic Wound/Ulcer of the Lower Extremity Etiology: Wound Location: Left, Plantar Foot Wound Open Wounding Event: Gradually Appeared Status: Date Acquired: 10/01/2022 Comorbid Chronic sinus problems/congestion, Congestive Heart Failure, Weeks Of Treatment: 12 History: Coronary Artery Disease, Hypertension, Myocardial Infarction, Type Clustered Wound: No II  Diabetes, Neuropathy [employer] ([id number]) [id number] Page 6 of 7 II Diabetes, Neuropathy Pending Amputation On Presentation Wound under treatment by Mariesha Venturella outside of Wound Center Photos Wound Measurements Length: (cm) 1 Width: (cm) 0.9 Depth: (cm) 0.2 Area: (cm) 0.707 Volume: (cm) 0.141 % Reduction in Area: 82% % Reduction in Volume: 82% Epithelialization: Small (1-33%) Tunneling: No Undermining: No Wound Description Classification: Grade 1 Wound Margin: Thickened Exudate Amount: Medium Exudate Type: Serosanguineous Exudate Color: red, brown Foul Odor After Cleansing: No Slough/Fibrino Yes Wound Bed Granulation Amount: Large (67-100%) Exposed Structure Granulation Quality: Red Fascia Exposed: No Necrotic Amount: None Present (0%) Fat Layer (Subcutaneous Tissue) Exposed: Yes Tendon Exposed: No Muscle Exposed: No Joint Exposed: No Bone Exposed: No Periwound Skin Texture Texture Color No Abnormalities Noted: No No Abnormalities Noted: No Callus: Yes Atrophie Blanche: No Crepitus: No Cyanosis: No Excoriation: No Ecchymosis: No Induration: No Erythema: No Rash: No Hemosiderin Staining: No Scarring: No Mottled: No Pallor: No Moisture Rubor: No No Abnormalities Noted: No Dry / Scaly: No Temperature / Pain Maceration: No Temperature: No Abnormality Treatment Notes Wound #3 (Foot) Wound Laterality: Plantar, Left Cleanser Wound Cleanser Discharge Instruction: Cleanse the wound with wound cleanser prior to applying a clean dressing using gauze sponges, not tissue or cotton balls. Peri-Wound [name] Zinc Oxide Ointment 30g tube Discharge Instruction: Apply Zinc Oxide to periwound with each dressing change Topical Primary Dressing Vashe Discharge Instruction: wet to dry gauze. 2x2 gauze Discharge Instruction: sock gauze with vashe. ROYLEE, CHAFFIN ([id number]) [id number] Page 7 of 7 Secondary  Dressing ABD Pad, 5x9 Discharge Instruction: ABD pad donut. Secured With [employer], 4.5x3.1 (in/yd) Discharge Instruction: Secure with Kerlix as directed. 73M Medipore H Soft Cloth  Surgical T ape, 4 x 10 (in/yd) Discharge Instruction: Secure with tape as directed. Compression Wrap Compression Stockings Add-Ons Electronic Signature(s) Signed: 04/24/2023 4:28:35 PM By: Thayer Dallas Entered By: Thayer Dallas on 04/24/2023 14:51:11 -------------------------------------------------------------------------------- Vitals Details Patient Name: Date of Service: [name] [place], [name] 04/24/2023 3:00 PM Medical Record Number: [id number] Patient Account Number: [id number] Date of Birth/Sex: Treating RN: 30-May-1971 (52 y.o. M) Primary [name] Kylei Purington: [name] Other Clinician: Referring Tarren Velardi: Treating Zeferino Mounts/Extender: [name] in Treatment: 12 Vital Signs Time Taken: 14:44 Temperature (F): 98.2 Height (in): 78 Pulse (bpm): 87 Weight (lbs): 275 Respiratory Rate (breaths/min): 18 Body Mass Index (BMI): 31.8 Blood Pressure (mmHg): 104/70 Reference Range: 80 - 120 mg / dl Electronic Signature(s) Signed: 04/24/2023 4:28:35 PM By: Thayer Dallas Entered By: Thayer Dallas on 04/24/2023 14:44:18

## 2023-04-26 NOTE — Progress Notes (Signed)
Glenn, [name] ([id number]) [id number] Page 1 of 10 Visit Report for 04/24/2023 Chief Complaint Document Details Patient Name: Date of Service: [name] [place], [name] RO LD 04/24/2023 3:00 PM Medical Record Number: [id number] Patient Account Number: [id number] Date of Birth/Sex: Treating RN: Aug 02, 1971 (52 y.o. M) Primary Care Provider: [name] Other Clinician: Referring Provider: Treating Provider/Extender: [name] in Treatment: 12 Information Obtained from: Patient Chief Complaint 01/15/2023; left foot wound Electronic Signature(s) Signed: 04/24/2023 3:59:36 PM By: Geralyn Corwin DO Entered By: Geralyn Corwin on 04/24/2023 15:31:08 -------------------------------------------------------------------------------- Debridement Details Patient Name: Date of Service: [name] [place], [name] 04/24/2023 3:00 PM Medical Record Number: [id number] Patient Account Number: [id number] Date of Birth/Sex: Treating RN: [birth year] (52 y.o. [name], [name] Primary Care Provider: [name] Other Clinician: Referring Provider: Treating Provider/Extender: [name] in Treatment: 12 Debridement Performed for Assessment: Wound #3 Left,Plantar Foot Performed By: Physician Geralyn Corwin, DO Debridement Type: Debridement Severity of Tissue Pre Debridement: Fat layer exposed Level of Consciousness (Pre-procedure): Awake and Alert Pre-procedure Verification/Time Out Yes - 15:15 Taken: Start Time: 15:15 Pain Control: Lidocaine Percent of Wound Bed Debrided: 100% T Area Debrided (cm): otal 0.71 Tissue and other material debrided: Viable, Non-Viable, Slough, Slough Level: Non-Viable Tissue Debridement Description: Selective/Open Wound Instrument: Curette Bleeding: Minimum Hemostasis Achieved: Pressure End Time: 15:15 Procedural Pain: 0 Post Procedural Pain: 0 Response to Treatment: Procedure was  tolerated well Level of Consciousness (Post- Awake and Alert procedure): Post Debridement Measurements of Total Wound Length: (cm) 1 Width: (cm) 0.9 Depth: (cm) 0.2 Volume: (cm) 0.141 Character of Wound/Ulcer Post Debridement: Improved Severity of Tissue Post Debridement: Fat layer exposed Post Procedure Diagnosis Same as Pre-procedure Electronic Signature(s) Signed: 04/24/2023 3:59:36 PM By: Geralyn Corwin DO Signed: 04/25/2023 12:17:26 PM By: Fonnie Mu RN [name], [name] ([id number]) [id number] Page 2 of 10 Entered By: Fonnie Mu on 04/24/2023 15:15:48 -------------------------------------------------------------------------------- HPI Details Patient Name: Date of Service: [name] [place], [name] RO LD 04/24/2023 3:00 PM Medical Record Number: [id number] Patient Account Number: [id number] Date of Birth/Sex: Treating RN: 05/23/1971 (52 y.o. M) Primary Care Provider: [name] Other Clinician: Referring Provider: Treating Provider/Extender: [name] in Treatment: 12 History of Present Illness HPI Description: ADMISSION 01/11/2020 This is a 52 year old man with uncontrolled type 2 diabetes with a recent hemoglobin A1c earlier this year of 13.4. He is on insulin and glipizide. He does not take his blood sugars at home. He does have a follow-up with primary care later this month I believe on January 27. He tells [place] that roughly a month ago he was walking with a shoe with a hole in his foot. He took the shoe off and there was an open wound at roughly the left fourth met head. This has significant undermining and raised edges. He has not noticed any purulence he does not feel unwell. More recently he was taking skin off the bottom of his foot and has a superficial area on the left fifth met head. He has not been offloading this. The patient was in the ER on 12/20. They gave him Bactroban which she has been using on  the wound and 10 days worth of doxycycline. No x-rays were done. He has not had vascular studies. He is also been using hydrogen peroxide. Past medical history type 2 diabetes uncontrolled, chronic systolic heart failure, coronary artery disease with a history of congestive heart failure with stents. Hypertension hyperlipidemia and chronic renal insufficiency ABI in this clinic was  1.14 on the left. 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 He is uncertain whether he would be able to work if we put him on some form of restriction 1/19; he is generally doing quite well. Using silver alginate on the wounds. Things actually look better. He has a forefoot offloading boot which she seems to be compliant about. He has support at work to stay off his foot is much as possible which is gratifying. Culture I did last week showed a few Enterococcus faecalis. I am going to put him on Augmentin. I talked about ordering an x-ray in my note last week but that does not seem to have happened. We will review reorder the x-ray this week. 1/26; x-ray reordered last week was negative for osteomyelitis. We are using silver alginate on the wound on the third and fifth met heads on the left. He is using a Darco forefoot off loader 2/2; the area on the fifth met head is closed. Third met head is still open with tunneling depth and thick callus. 2/9; the area on the fifth met head remains closed however the third met head again has a small open area on presentation with tunneling in depth and surrounded by thick callus. This looks like a pressure issue. We have been using silver alginate 2/16; the area of the fifth metatarsal head remains closed however the area over the third metatarsal head again is a small open area but with some depth. I do not think this is changed much since last week. He is using Hydrofera Blue with forefoot off loader. He is not able to use a total contact cast on the left  leg because he needs his left leg at work [employer]]. Fortunately the wound does not look infected. I changed him to endoform today 2/26; the area of the fifth metatarsal head remains closed. The area of the third metatarsal head has an even smaller opening this time. I used endoform on this this looks improved. He is offloading this is much as he can and a forefoot off loader on the left. He cannot have a total contact cast because of work responsibilities 3/5; the area on the fifth metatarsal head remains closed the area on the third metatarsal head is also closed on the left foot. 01/15/2023 [name] is a 52 year old male with a past medical history of uncontrolled insulin-dependent type 2 diabetes with self-reported last hemoglobin A1c of 12, previous amputations to his feet bilaterally secondary to osteomyelitis, CAD and chronic combined systolic and diastolic heart failure that presents to the clinic for a 23-month history of nonhealing ulcer to the left lateral foot. He has been following for podiatry for several months for this issue. He is currently using wet-to-dry saline dressings. He currently denies signs of infection. Progresse note from 1/17; Patient presents with a 38-month history of nonhealing ulcer to the left foot secondary to diabetes and inability to offload well the area. He has had multiple debridements in the past to his feet bilaterally. He has had resection of the fourth left met head in the fifth toe Secondary to osteomyelitis. We discussed the importance of glycemic control for wound healing. Due to his blood glucose levels being elevated he is at high risk for infection and thus further amputation. He expressed understanding. He states he is supposed to be referred to an endocrinologist at [facility] however the referral fell through. Unclear what happened. Offered a referral to endocrinology  at [facility]. Patient was agreeable.Furthermore we discussed the  importance of aggressive offloading for his wound healing. This will be the most difficult part of the treatment plan for the patient to do. We discussed a total contact cast however he has declined that at this time. 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 He is currently using a surgical shoe with a peg assist. It does not fit well so we will give him a new one today. I do not think this is enough offloading. If he is not able to offload this area he will likely end up with a BKA. He is well aware of this. For now I recommended Medihoney and Hydrofera Blue for dressing changes. He will follow-up in our [place] office Since this is closer for him. 1/26; patient presents for follow-up. Last clinic visit I had seen him in [place] and we transferred him to [place] since this is a closer location for him. Progress note above from that visit. Over the past week He has been using Medihoney and Hydrofera Blue to the wound bed. He has been using his surgical shoe with peg assist. He has no issues or complaints today. We discussed doing the total contact cast and he was agreeable to having this placed at next clinic visit. 2/2; patient presents for follow-up. He has been using Vashe wet-to-dry dressings. Plan is for the total contact cast today. 2/5; patient presents for follow-up. The cast was placed 3 days ago. He tolerated this well however had a lot of drainage. He currently denies signs of infection. 2/9; patient presents for follow-up. At last clinic visit we held off on replacing the cast due to drainage. He has been using Vashe wet-to-dry dressings and he is currently taking the oral antibiotics prescribed without issues. He declines a total contact cast today. We ran insurance verification for skin substitute and due to cost patient declines having this placed. 2/16; patient presents for follow-up. He has decided  not to follow-up with podiatry. He continues to decline the total contact cast. He has been using Vashe wet- to-dry dressings. He has not been wearing his surgical shoe with peg assist. He currently denies signs of infection. [name], [name] ([id number]) [id number] Page 3 of 10 3/1; patient presents for follow-up. He has been doing Vashe wet-to-dry dressings. He has a surgical shoe with peg assist. We discussed potentially doing a skin substitute for which she has been approved for by insurance. He knows the out-of-pocket cost of this and would still like to proceed with having this placed today. He denies the current total contact cast. 3/8; patient presents for follow-up. He states that the skin substitute came off after a few days and he has been using Vashe wet-to-dry dressings. He has mild odor to the wound bed on exam. 3/15; patient presents for follow-up. He has been using Vashe wet-to-dry dressings and started his course of Augmentin. He reports improvement in wound healing. He has no issues or complaints today. 3/21; patient presents for follow-up. He continues to use Vashe wet-to-dry dressings. He has no issues or complaints today. 3/28; patient presents for follow-up. He has been using Vashe wet-to-dry dressings. He has no issues or complaints today. He has been using his peg assist with surgical shoe. 4/11; patient presents for follow-up. He has been using Vashe wet-to-dry dressings. The periwound is macerated. He has been using his peg  assist with surgical shoe. He has no issues or complaints today. 4/25; patient presents for follow-up. Has been using Vashe wet-to-dry dressings. He has been using zinc oxide to the periwound. For offloading he has been using his peg assist in surgical shoe. He has no issues or complaints today. Electronic Signature(s) Signed: 04/24/2023 3:59:36 PM By: Geralyn Corwin DO Entered By: Geralyn Corwin on 04/24/2023  15:31:37 -------------------------------------------------------------------------------- Physical Exam Details Patient Name: Date of Service: [name] [place], [name] 04/24/2023 3:00 PM Medical Record Number: [id number] Patient Account Number: [id number] Date of Birth/Sex: Treating RN: [birth year] (52 y.o. M) Primary Care Provider: [name] Other Clinician: Referring Provider: Treating Provider/Extender: [name] in Treatment: 12 Constitutional respirations regular, non-labored and within target range for patient.. Cardiovascular 2+ dorsalis pedis/posterior tibialis pulses. Psychiatric pleasant and cooperative. Notes Left foot: T the lateral aspect, fifth met head there is an open wound with granulation tissue And slough accumulation. No signs of infection. o Electronic Signature(s) Signed: 04/24/2023 3:59:36 PM By: Geralyn Corwin DO Entered By: Geralyn Corwin on 04/24/2023 15:32:15 -------------------------------------------------------------------------------- Physician Orders Details Patient Name: Date of Service: [name] [place], [name] 04/24/2023 3:00 PM Medical Record Number: [id number] Patient Account Number: [id number] Date of Birth/Sex: Treating RN: [birth year] (52 y.o. [name], [name] Primary Care Provider: [name] Other Clinician: Referring Provider: Treating Provider/Extender: [name] in Treatment: 12 Verbal / Phone Orders: No Diagnosis Coding Follow-up Appointments ppointment in 2 weeks. - Dr. Mikey Bussing and Maryruth Bun Room # 9 Thurs.05/08/23 @ 9:30 Return A Cellular or Tissue Based Products Cellular or Tissue Based Product Type: - apligraf and grafix 50% coinsurance 02/28/23-apply 1st Grafix 03/07/2023 HOLD THIS WEEK. Bathing/ Shower/ [address] [place], [place] ([id number]) [id number] Page 4 of 10 May shower with protection but do not get wound dressing(s) wet.  Protect dressing(s) with water repellant cover (for example, large plastic bag) or a cast cover and may then take shower. Off-Loading Other: - keep pressure off left lateral foot; use peg assist as insert in surgical shoe Additional Orders / Instructions Other: - ice, elevation and ace wrap to foot and ankle to aid in the right medial foot area. Wound Treatment Wound #3 - Foot Wound Laterality: Plantar, Left Cleanser: Wound Cleanser 1 x Per Day/15 Days Discharge Instructions: Cleanse the wound with wound cleanser prior to applying a clean dressing using gauze sponges, not tissue or cotton balls. Peri-Wound Care: Zinc Oxide Ointment 30g tube 1 x Per Day/15 Days Discharge Instructions: Apply Zinc Oxide to periwound with each dressing change Prim Dressing: Vashe 1 x Per Day/15 Days ary Discharge Instructions: wet to dry gauze. Prim Dressing: 2x2 gauze 1 x Per Day/15 Days ary Discharge Instructions: sock gauze with vashe. Secondary Dressing: ABD Pad, 5x9 1 x Per Day/15 Days Discharge Instructions: ABD pad donut. Secured With: [employer], 4.5x3.1 (in/yd) 1 x Per Day/15 Days Discharge Instructions: Secure with Kerlix as directed. Secured With: 104M Medipore H Soft Cloth Surgical T ape, 4 x 10 (in/yd) 1 x Per Day/15 Days Discharge Instructions: Secure with tape as directed. Electronic Signature(s) Signed: 04/24/2023 3:59:36 PM By: Geralyn Corwin DO Entered By: Geralyn Corwin on 04/24/2023 15:32:23 -------------------------------------------------------------------------------- Problem List Details Patient Name: Date of Service: [name] [place], [name] 04/24/2023 3:00 PM Medical Record Number: [id number] Patient Account Number: [id number] Date of Birth/Sex: Treating RN: 1971/09/02 (52 y.o. M) Primary Care Provider: [name] Other Clinician: Referring Provider: Treating Provider/Extender: [name] in Treatment: 12 Active  Problems ICD-10 Encounter Code Description Active Date MDM Diagnosis L97.522 Non-pressure chronic ulcer of other part of left foot with fat layer exposed 01/24/2023 No Yes E11.621 Type 2 diabetes mellitus with foot ulcer 01/24/2023 No Yes Z89.422 Acquired absence of other left toe(s) 01/24/2023 No Yes Inactive Problems Resolved Problems Electronic Signature(s) Signed: 04/24/2023 3:59:36 PM By: Kingsley Plan, [name] ([id number]) [id number] Page 5 of 10 Entered By: Geralyn Corwin on 04/24/2023 15:30:54 -------------------------------------------------------------------------------- Progress Note Details Patient Name: Date of Service: [name] [place], [name] RO LD 04/24/2023 3:00 PM Medical Record Number: [id number] Patient Account Number: [id number] Date of Birth/Sex: Treating RN: 1971/03/27 (52 y.o. M) Primary Care Provider: [name] Other Clinician: Referring Provider: Treating Provider/Extender: [name] in Treatment: 12 Subjective Chief Complaint Information obtained from Patient 01/15/2023; left foot wound History of Present Illness (HPI) ADMISSION 01/11/2020 This is a 52 year old man with uncontrolled type 2 diabetes with a recent hemoglobin A1c earlier this year of 13.4. He is on insulin and glipizide. He does not take his blood sugars at home. He does have a follow-up with primary care later this month I believe on January 27. He tells [place] that roughly a month ago he was walking with a shoe with a hole in his foot. He took the shoe off and there was an open wound at roughly the left fourth met head. This has significant undermining and raised edges. He has not noticed any purulence he does not feel unwell. More recently he was taking skin off the bottom of his foot and has a superficial area on the left fifth met head. He has not been offloading this. The patient was in the ER on 12/20. They gave him Bactroban  which she has been using on the wound and 10 days worth of doxycycline. No x-rays were done. He has not had vascular studies. He is also been using hydrogen peroxide. Past medical history type 2 diabetes uncontrolled, chronic systolic heart failure, coronary artery disease with a history of congestive heart failure with stents. Hypertension hyperlipidemia and chronic renal insufficiency ABI in this clinic was 1.14 on the left. 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 He is uncertain whether he would be able to work if we put him on some form of restriction 1/19; he is generally doing quite well. Using silver alginate on the wounds. Things actually look better. He has a forefoot offloading boot which she seems to be compliant about. He has support at work to stay off his foot is much as possible which is gratifying. Culture I did last week showed a few Enterococcus faecalis. I am going to put him on Augmentin. I talked about ordering an x-ray in my note last week but that does not seem to have happened. We will review reorder the x-ray this week. 1/26; x-ray reordered last week was negative for osteomyelitis. We are using silver alginate on the wound on the third and fifth met heads on the left. He is using a Darco forefoot off loader 2/2; the area on the fifth met head is closed. Third met head is still open with tunneling depth and thick callus. 2/9; the area on the fifth met head remains closed however the third met head again has a small open area on presentation with tunneling in depth and surrounded by thick callus. This looks like a pressure issue. We have been using silver alginate 2/16; the area of the fifth metatarsal head  remains closed however the area over the third metatarsal head again is a small open area but with some depth. I do not think this is changed much since last week. He is using Hydrofera Blue with forefoot off loader. He is not able to use a  total contact cast on the left leg because he needs his left leg at work [employer]]. Fortunately the wound does not look infected. I changed him to endoform today 2/26; the area of the fifth metatarsal head remains closed. The area of the third metatarsal head has an even smaller opening this time. I used endoform on this this looks improved. He is offloading this is much as he can and a forefoot off loader on the left. He cannot have a total contact cast because of work responsibilities 3/5; the area on the fifth metatarsal head remains closed the area on the third metatarsal head is also closed on the left foot. 01/15/2023 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 He has been following for podiatry for several months for this issue. He is currently using wet-to-dry saline dressings. He currently denies signs of infection. Progresse note from 1/17; Patient presents with a 74-month history of nonhealing ulcer to the left foot secondary to diabetes and inability to offload well the area. He has had multiple debridements in the past to his feet bilaterally. He has had resection of the fourth left met head in the fifth toe Secondary to osteomyelitis. We discussed the importance of glycemic control for wound healing. Due to his blood glucose levels being elevated he is at high risk for infection and thus further amputation. He expressed understanding. He states he is supposed to be referred to an endocrinologist at [facility] however the referral fell through. Unclear what happened. Offered a referral to endocrinology at [facility]. Patient was  agreeable.Furthermore we discussed the importance of aggressive offloading for his wound healing. This will be the most difficult part of the treatment plan for the patient to do. We discussed a total contact cast however he has declined that at this time. 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 He is currently using a surgical shoe with a peg assist. It does not fit well so we will give him a new one today. I do not think this is enough offloading. If he is not able to offload this area he will likely end up with a BKA. He is well aware of this. For now I recommended Medihoney and Hydrofera Blue for dressing changes. He will follow-up in our [place] office Since this is closer for him. 1/26; patient presents for follow-up. Last clinic visit I had seen him in [place] and we transferred him to [place] since this is a closer location for him. Progress note above from that visit. Over the past week He has been using Medihoney and Hydrofera Blue to the wound bed. He has been using his surgical shoe with peg assist. He has no issues or complaints today. We discussed doing the total contact cast and he was agreeable to having this placed at next  clinic visit. 2/2; patient presents for follow-up. He has been using Vashe wet-to-dry dressings. Plan is for the total contact cast today. 2/5; patient presents for follow-up. The cast was placed 3 days ago. He tolerated this well however had a lot of drainage. He currently denies signs of infection. 2/9; patient presents for follow-up. At last clinic visit we held off on replacing the cast due to drainage. He has been using Vashe wet-to-dry dressings and he is currently taking the oral antibiotics prescribed without issues. He declines a total contact cast today. We ran insurance verification for skin substitute and due [name], [name] ([id number])  [id number] Page 6 of 10 to cost patient declines having this placed. 2/16; patient presents for follow-up. He has decided not to follow-up with podiatry. He continues to decline the total contact cast. He has been using Vashe wet- to-dry dressings. He has not been wearing his surgical shoe with peg assist. He currently denies signs of infection. 3/1; patient presents for follow-up. He has been doing Vashe wet-to-dry dressings. He has a surgical shoe with peg assist. We discussed potentially doing a skin substitute for which she has been approved for by insurance. He knows the out-of-pocket cost of this and would still like to proceed with having this placed today. He denies the current total contact cast. 3/8; patient presents for follow-up. He states that the skin substitute came off after a few days and he has been using Vashe wet-to-dry dressings. He has mild odor to the wound bed on exam. 3/15; patient presents for follow-up. He has been using Vashe wet-to-dry dressings and started his course of Augmentin. He reports improvement in wound healing. He has no issues or complaints today. 3/21; patient presents for follow-up. He continues to use Vashe wet-to-dry dressings. He has no issues or complaints today. 3/28; patient presents for follow-up. He has been using Vashe wet-to-dry dressings. He has no issues or complaints today. He has been using his peg assist with surgical shoe. 4/11; patient presents for follow-up. He has been using Vashe wet-to-dry dressings. The periwound is macerated. He has been using his peg assist with surgical shoe. He has no issues or complaints today. 4/25; patient presents for follow-up. Has been using Vashe wet-to-dry dressings. He has been using zinc oxide to the periwound. For offloading he has been using his peg assist in surgical shoe. He has no issues or complaints today. Patient History Information obtained from Patient. Family  History Heart Disease - Mother,Father, No family history of Cancer, Diabetes, Hereditary Spherocytosis, Hypertension, Kidney Disease, Lung Disease, Seizures, Stroke, Thyroid Problems, Tuberculosis. Social History Never smoker, Marital Status - Single, Alcohol Use - Rarely, Drug Use - No History, Caffeine Use - Rarely. 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 60 Denies history of Angina, Arrhythmia, Deep Vein Thrombosis, Hypotension, Peripheral Arterial Disease, Peripheral Venous Disease, Phlebitis, Vasculitis Gastrointestinal Denies history of Cirrhosis , Colitis, Crohnoos, Hepatitis A, Hepatitis B, Hepatitis C Endocrine Patient has history of Type II Diabetes Genitourinary Denies history of End Stage Renal Disease Immunological Denies history of Lupus Erythematosus, Raynaudoos, Scleroderma Integumentary (Skin) Denies history of History of Burn Musculoskeletal Denies history of  Gout, Rheumatoid Arthritis, Osteoarthritis, Osteomyelitis Neurologic Patient has history of Neuropathy Denies history of Dementia, Quadriplegia, Paraplegia, Seizure Disorder Oncologic Denies history of Received Chemotherapy, Received Radiation Psychiatric Denies history of Anorexia/bulimia, Confinement Anxiety Hospitalization/Surgery History - Heart Cath in 2018. Medical A Surgical History Notes nd Cardiovascular Ischemic Cardiomyopathy Genitourinary Renal  Insufficiency Objective [name], [name] ([id number]) [id number] Page 7 of 10 Constitutional respirations regular, non-labored and within target range for patient.. Vitals Time Taken: 2:44 PM, Height: 78 in, Weight: 275 lbs, BMI: 31.8, Temperature: 98.2 F, Pulse: 87 bpm, Respiratory Rate: 18 breaths/min, Blood Pressure: 104/70 mmHg. Cardiovascular 2+ dorsalis pedis/posterior tibialis pulses. Psychiatric pleasant and cooperative. General Notes: Left foot: T the lateral aspect, fifth met head there is an open wound with granulation tissue And slough accumulation. No signs of infection. o Integumentary (Hair, Skin) Wound #3 status is Open. Original cause of wound was Gradually Appeared. The date acquired was: 10/01/2022. The wound has been in treatment 12 weeks. The wound is located on the Left,Plantar Foot. The wound measures 1cm length x 0.9cm width x 0.2cm depth; 0.707cm^2 area and 0.141cm^3 volume. There is Fat Layer (Subcutaneous Tissue) exposed. There is no tunneling or undermining noted. There is a medium amount of serosanguineous drainage noted. The wound margin is thickened. There is large (67-100%) red granulation within the wound bed. There is no necrotic tissue within the wound bed. The periwound skin appearance exhibited: Callus. The periwound skin appearance did not exhibit: Crepitus, Excoriation, Induration, Rash, Scarring, Dry/Scaly, Maceration, Atrophie Blanche, Cyanosis, Ecchymosis, Hemosiderin Staining, Mottled, Pallor, Rubor, Erythema. Periwound temperature was noted as No Abnormality. 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 I debrided nonviable tissue. The tissue is not quite healthy in appearance however he has not done well with any other dressing including a skin  substitute. I recommended continuing Vashe wet-to-dry dressings and aggressive offloading. Due to the anatomy of his foot this wound may never heal. Procedures Wound #3 Pre-procedure diagnosis of Wound #3 is a Diabetic Wound/Ulcer of the Lower Extremity located on the Left,Plantar Foot .Severity of Tissue Pre Debridement is: Fat layer exposed. There was a Selective/Open Wound Non-Viable Tissue Debridement with a total area of 0.71 sq cm performed by Geralyn Corwin, DO. With the following instrument(s): Curette to remove Viable and Non-Viable tissue/material. Material removed includes Slough after achieving pain control using Lidocaine. No specimens were taken. A time out was conducted at 15:15, prior to the start of the procedure. A Minimum amount of bleeding was controlled with Pressure. The procedure was tolerated well with a pain level of 0 throughout and a pain level of 0 following the procedure. Post Debridement Measurements: 1cm length x 0.9cm width x 0.2cm depth; 0.141cm^3 volume. Character of Wound/Ulcer Post Debridement is improved. Severity of Tissue Post Debridement is: Fat layer exposed. Post procedure Diagnosis Wound #3: Same as Pre-Procedure Plan Follow-up Appointments: Return Appointment in 2 weeks. - Dr. Mikey Bussing and Maryruth Bun Room # 9 Thurs.05/08/23 @ 9:30 Cellular or Tissue Based Products: Cellular or Tissue Based Product Type: - apligraf and grafix 50% coinsurance 02/28/23-apply 1st Grafix 03/07/2023 HOLD THIS WEEK. Bathing/ Shower/ Hygiene: May shower with protection but do not get wound dressing(s) wet. Protect dressing(s) with water repellant cover (for example, large plastic bag) or a cast cover and may then take shower. Off-Loading: Other: - keep pressure off left lateral foot; use peg  assist as insert in surgical shoe Additional Orders / Instructions: Other: - ice, elevation and ace wrap to foot and ankle to aid in the right medial foot area. WOUND #3: - Foot Wound  Laterality: Plantar, Left Cleanser: Wound Cleanser 1 x Per Day/15 Days Discharge Instructions: Cleanse the wound with wound cleanser prior to applying a clean dressing using gauze sponges, not tissue or cotton balls. Peri-Wound Care: Zinc Oxide Ointment 30g tube 1 x Per Day/15 Days Discharge Instructions: Apply Zinc Oxide to periwound with each dressing change Prim Dressing: Vashe 1 x Per Day/15 Days ary Discharge Instructions: wet to dry gauze. Prim Dressing: 2x2 gauze 1 x Per Day/15 Days ary Discharge Instructions: sock gauze with vashe. Secondary Dressing: ABD Pad, 5x9 1 x Per Day/15 Days Discharge Instructions: ABD pad donut. Secured With: [employer], 4.5x3.1 (in/yd) 1 x Per Day/15 Days [name], [name] ([id number]) [id number] Page 8 of 10 Discharge Instructions: Secure with Kerlix as directed. Secured With: 62M Medipore H Soft Cloth Surgical T ape, 4 x 10 (in/yd) 1 x Per Day/15 Days Discharge Instructions: Secure with tape as directed. 1. In office sharp debridement 2. Vashe wet-to-dry dressings 3. Aggressive offloadingoosurgical shoe with peg assist 4. Follow-up in 2 weeks Electronic Signature(s) Signed: 04/24/2023 3:59:36 PM By: Geralyn Corwin DO Entered By: Geralyn Corwin on 04/24/2023 15:33:28 -------------------------------------------------------------------------------- HxROS Details Patient Name: Date of Service: [name] [place], [name] 04/24/2023 3:00 PM Medical Record Number: [id number] Patient Account Number: [id number] Date of Birth/Sex: Treating RN: July 09, 1971 (52 y.o. M) Primary Care Provider: [name] Other Clinician: Referring Provider: Treating Provider/Extender: [name] in Treatment: 12 Information Obtained From Patient Eyes Medical History: Negative for: Cataracts; Glaucoma; Optic Neuritis Ear/Nose/Mouth/Throat Medical History: Positive for: Chronic sinus  problems/congestion Negative for: Middle ear problems Hematologic/Lymphatic Medical History: Negative for: Anemia; Hemophilia; Human Immunodeficiency Virus; Lymphedema; Sickle Cell Disease Respiratory Medical History: Negative for: Aspiration; Asthma; Chronic Obstructive Pulmonary Disease (COPD); Pneumothorax; Sleep Apnea; Tuberculosis Cardiovascular Medical History: Positive for: Congestive Heart Failure; Coronary Artery Disease; Hypertension; Myocardial Infarction - age 46 Negative for: Angina; Arrhythmia; Deep Vein Thrombosis; Hypotension; Peripheral Arterial Disease; Peripheral Venous Disease; Phlebitis; Vasculitis Past Medical History Notes: Ischemic Cardiomyopathy Gastrointestinal Medical History: Negative for: Cirrhosis ; Colitis; Crohns; Hepatitis A; Hepatitis B; Hepatitis C Endocrine Medical History: Positive for: Type II Diabetes Time with diabetes: ten years Treated with: Insulin Blood sugar tested every day: No Genitourinary Medical History: Negative for: End Stage Renal Disease Past Medical History NotesMarland Kitchen [name], [name] ([id number]) [id number] Page 9 of 10 Renal Insufficiency Immunological Medical History: Negative for: Lupus Erythematosus; Raynauds; Scleroderma Integumentary (Skin) Medical History: Negative for: History of Burn Musculoskeletal Medical History: Negative for: Gout; Rheumatoid Arthritis; Osteoarthritis; Osteomyelitis Neurologic Medical History: Positive for: Neuropathy Negative for: Dementia; Quadriplegia; Paraplegia; Seizure Disorder Oncologic Medical History: Negative for: Received Chemotherapy; Received Radiation Psychiatric Medical History: Negative for: Anorexia/bulimia; Confinement Anxiety HBO Extended History Items Ear/Nose/Mouth/Throat: Chronic sinus problems/congestion Immunizations Pneumococcal Vaccine: Received Pneumococcal Vaccination: No Implantable Devices None Hospitalization / Surgery  History Type of Hospitalization/Surgery Heart Cath in 2018 Family and Social History Cancer: No; Diabetes: No; Heart Disease: Yes - Mother,Father; Hereditary Spherocytosis: No; Hypertension: No; Kidney Disease: No; Lung Disease: No; Seizures: No; Stroke: No; Thyroid Problems: No; Tuberculosis: No; Never smoker; Marital Status - Single; Alcohol Use: Rarely; Drug Use: No History; Caffeine Use: Rarely; Financial Concerns: No; Food, Clothing or Shelter Needs: No; Support System Lacking: No; Transportation Concerns: No Electronic Signature(s) Signed: 04/24/2023 3:59:36 PM By: Geralyn Corwin DO  Entered By: Geralyn Corwin on 04/24/2023 15:31:43 -------------------------------------------------------------------------------- SuperBill Details Patient Name: Date of Service: [name] [place], [name] RO LD 04/24/2023 Medical Record Number: [id number] Patient Account Number: [id number] Date of Birth/Sex: Treating RN: 03-Feb-1971 (52 y.o. [name], [name] Primary Care Provider: [name] Other Clinician: Referring Provider: Treating Provider/Extender: [name] in Treatment: 12 Diagnosis Coding ICD-10 Codes Code Description [name], [name] ([id number]) [id number] Page 10 of 10 [phone] 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.621 Type 2 diabetes mellitus with foot ulcer Z89.422 Acquired absence of other left toe(s) Facility Procedures : CPT4 Code: [id number] Description: 97597 - DEBRIDE WOUND 1ST 20 SQ CM OR < ICD-10 Diagnosis Description L97.522 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.621 Type 2 diabetes mellitus with foot ulcer Modifier: Quantity: 1 Physician Procedures : CPT4 Code Description Modifier [id number] - WC PHYS DEBR WO ANESTH 20 SQ CM ICD-10 Diagnosis Description L97.522 Non-pressure chronic ulcer of other part of left foot with fat layer  exposed E11.621 Type 2 diabetes mellitus with foot ulcer Quantity: 1 Electronic Signature(s) Signed: 04/24/2023 3:59:36 PM By: Geralyn Corwin DO Entered By: Geralyn Corwin on 04/24/2023 15:33:37

## 2023-05-05 ENCOUNTER — Other Ambulatory Visit: Payer: Self-pay

## 2023-05-05 ENCOUNTER — Encounter: Payer: Self-pay | Admitting: Family Medicine

## 2023-05-05 ENCOUNTER — Ambulatory Visit: Payer: 59 | Attending: Family Medicine | Admitting: Family Medicine

## 2023-05-05 VITALS — BP 116/82 | HR 85 | Ht 78.0 in | Wt 284.8 lb

## 2023-05-05 DIAGNOSIS — E11621 Type 2 diabetes mellitus with foot ulcer: Secondary | ICD-10-CM

## 2023-05-05 DIAGNOSIS — Z794 Long term (current) use of insulin: Secondary | ICD-10-CM

## 2023-05-05 DIAGNOSIS — E1165 Type 2 diabetes mellitus with hyperglycemia: Secondary | ICD-10-CM | POA: Diagnosis not present

## 2023-05-05 DIAGNOSIS — I10 Essential (primary) hypertension: Secondary | ICD-10-CM

## 2023-05-05 DIAGNOSIS — L97421 Non-pressure chronic ulcer of left heel and midfoot limited to breakdown of skin: Secondary | ICD-10-CM | POA: Diagnosis not present

## 2023-05-05 DIAGNOSIS — I255 Ischemic cardiomyopathy: Secondary | ICD-10-CM | POA: Diagnosis not present

## 2023-05-05 LAB — POCT GLYCOSYLATED HEMOGLOBIN (HGB A1C): HbA1c, POC (controlled diabetic range): 12.4 % — AB (ref 0.0–7.0)

## 2023-05-05 LAB — GLUCOSE, POCT (MANUAL RESULT ENTRY): POC Glucose: 245 mg/dl — AB (ref 70–99)

## 2023-05-05 MED ORDER — BASAGLAR KWIKPEN 100 UNIT/ML ~~LOC~~ SOPN
60.0000 [IU] | PEN_INJECTOR | Freq: Every day | SUBCUTANEOUS | 6 refills | Status: DC
  Filled 2023-05-05 – 2023-05-19 (×2): qty 30, 50d supply, fill #0
  Filled 2023-07-28: qty 30, 50d supply, fill #1
  Filled 2023-09-25: qty 30, 50d supply, fill #2

## 2023-05-05 MED ORDER — FUROSEMIDE 40 MG PO TABS
40.0000 mg | ORAL_TABLET | Freq: Every day | ORAL | 1 refills | Status: DC
Start: 2023-05-05 — End: 2023-10-15
  Filled 2023-05-05: qty 90, 90d supply, fill #0
  Filled 2023-05-22: qty 30, 30d supply, fill #0
  Filled 2023-06-23: qty 30, 30d supply, fill #1
  Filled 2023-07-28: qty 30, 30d supply, fill #2
  Filled 2023-08-25: qty 30, 30d supply, fill #3
  Filled 2023-10-10 (×3): qty 30, 30d supply, fill #4

## 2023-05-05 MED ORDER — OMEPRAZOLE 20 MG PO CPDR
20.0000 mg | DELAYED_RELEASE_CAPSULE | Freq: Every day | ORAL | 1 refills | Status: DC
  Filled 2023-05-05 – 2023-07-28 (×2): qty 90, 90d supply, fill #0
  Filled 2023-10-28: qty 30, 30d supply, fill #1
  Filled 2023-11-25: qty 30, 30d supply, fill #2
  Filled 2023-12-23: qty 30, 30d supply, fill #3

## 2023-05-05 NOTE — Patient Instructions (Signed)

## 2023-05-05 NOTE — Progress Notes (Signed)
Subjective:  Patient ID: [name], male    DOB: Oct 10, 1971  Age: 52 y.o. MRN: [id number]  CC: Diabetes   HPI [name] is a 52 y.o. 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   Interval History:  He is under the care of of wound management for management of a left foot diabetic ulcer.  He Complains that when he was placed on Ozempic, this worsened his sugars and he was also taken off Glipizide by his PCP 'because the medication was too old'. Fasting sugars are 180-210 He administers Novolog bid rather than tid due to his work schedule but endorses adherence with 50 units of Basaglar.  He also acknowledges that he is not totally adherent with a diabetic diet.  From a cardiac standpoint he is doing well with no dyspnea, chest pain, palpitations. Endorses adherence with his statin and antihypertensive. Denies presence of additional concerns today. Past Medical History:  Diagnosis Date   Acute systolic CHF (congestive heart failure) (HCC) 10/28/2016   CAD (coronary artery disease)    NSTEMI 10/2011: LHC 11/04/11: pLAD 90%, mLAD 60-70%, small D2 sub totally occluded at ostium, small OM1 90% ostial, 90% mid, mOM2 30%, oPL 80%, RCA 30%, dPDA 80%, EF 20% with ant AK.  PCI:  Promus DES to pLAD.   Cellulitis of left foot 06/22/2020   Chronic kidney disease    CKD   Chronic systolic heart failure (HCC)    Diabetic ulcer of left midfoot associated with type 2 diabetes mellitus, limited to breakdown of skin (HCC)    Diabetic ulcer of left midfoot associated with type 2 diabetes mellitus, with muscle involvement without evidence of necrosis (HCC)    DM2 (diabetes mellitus, type 2) (HCC)    type 2   GERD (gastroesophageal reflux disease)    HTN (hypertension)     Hyperlipidemia    Ischemic cardiomyopathy    Echo 11/03/11: mod LVH, mild focal basal septal hypertrophy, EF 15%, grade 2 diast dysfxn, mild MR, mild to mod LAE, mild RVE, mild to mod reduced RVSF.  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   Ulcer of right foot (HCC) 06/22/2020    Past Surgical History:  Procedure Laterality Date   AMPUTATION Left 06/23/2020   Procedure: AMPUTATION RAY 5th;  Surgeon: Vivi Barrack, DPM;  Location: [facility] OR;  Service: Podiatry;  Laterality: Left;   BONE BIOPSY Left 12/20/2020   Procedure: BONE BIOPSY X 3;  Surgeon: Park Liter, DPM;  Location: [facility];  Service: Podiatry;  Laterality: Left;   CARDIAC CATHETERIZATION N/A 10/25/2016   Procedure: Right/Left Heart Cath and Coronary Angiography;  Surgeon: Marykay Lex, MD;  Location: [facility] INVASIVE CV LAB;  Service: Cardiovascular;  Laterality: N/A;   CARDIAC CATHETERIZATION N/A 10/25/2016   Procedure: Coronary Stent Intervention;  Surgeon: Marykay Lex, MD;  Location: [facility] INVASIVE CV LAB;  Service: Cardiovascular;  Laterality: N/A;   CARDIAC CATHETERIZATION N/A 10/28/2016   Procedure: Coronary Stent Intervention;  Surgeon: Kathleene Hazel, MD;  Location: [facility] INVASIVE CV LAB;  Service: Cardiovascular;  Laterality: N/A;   Feet surgery Bilateral  GRAFT APPLICATION Left 07/14/2020   Procedure: APPLICATION OF SKIN GRAFT USING [employer]TE[employer] BILAYER MATRIX WOUND DRESSING;  Surgeon: Park Liter, DPM;  Location: [facility] OR;  Service: Podiatry;  Laterality: Left;   I & D EXTREMITY Left 07/14/2020   Procedure: IRRIGATION AND DEBRIDEMENT LEFT FOOT;  Surgeon: Park Liter, DPM;  Location: [facility] OR;  Service: Podiatry;  Laterality: Left;   LEFT AND RIGHT HEART CATHETERIZATION WITH CORONARY ANGIOGRAM N/A 11/04/2011   Procedure: LEFT AND RIGHT HEART CATHETERIZATION WITH CORONARY ANGIOGRAM;   Surgeon: Rollene Rotunda, MD;  Location: [facility] CATH LAB;  Service: Cardiovascular;  Laterality: N/A;   METATARSAL HEAD EXCISION Left 01/24/2021   Procedure: 4th METATARSAL HEAD EXCISION, Proximal phalanx resection;  Surgeon: Park Liter, DPM;  Location: [facility];  Service: Podiatry;  Laterality: Left;   METATARSAL HEAD EXCISION Right 03/30/2021   Procedure: PROXIMAL 5TH TOE RESECTION ON RIGHT;  Surgeon: Park Liter, DPM;  Location: [facility];  Service: Podiatry;  Laterality: Right;   PERCUTANEOUS CORONARY STENT INTERVENTION (PCI-S)  11/04/2011   Procedure: PERCUTANEOUS CORONARY STENT INTERVENTION (PCI-S);  Surgeon: Tonny Bollman, MD;  Location: [facility] CATH LAB;  Service: Cardiovascular;;   WOUND DEBRIDEMENT Left 06/26/2020   Procedure: LEFT FOOT WOUND DEBRIDEMENT AND GRAFT APPLICATION;  Surgeon: Vivi Barrack, DPM;  Location: [facility] OR;  Service: Podiatry;  Laterality: Left;   WOUND DEBRIDEMENT Left 12/20/2020   Procedure: DEBRIDEMENT WOUND AND POSSIBLE CLOSURE;  Surgeon: Park Liter, DPM;  Location: [facility];  Service: Podiatry;  Laterality: Left;   WOUND DEBRIDEMENT Left 01/24/2021   Procedure: DEBRIDEMENT WOUND;  Surgeon: Park Liter, DPM;  Location: [facility];  Service: Podiatry;  Laterality: Left;   WOUND DEBRIDEMENT Bilateral 03/30/2021   Procedure: DEBRIDEMENT WOUND WITH SKIN GRAFT SUBSTITUTE APPLICATION;  Surgeon: Park Liter, DPM;  Location: [facility];  Service: Podiatry;  Laterality: Bilateral;   WOUND DEBRIDEMENT Right 04/20/2021   Procedure: DEBRIDEMENT Valrie Hart FOOT;  Surgeon: Park Liter, DPM;  Location: [facility];  Service: Podiatry;  Laterality: Right;    Family History  Problem Relation Age of Onset   Heart disease Father    Heart disease Mother        MOTHER HAD CABG    Social History   Socioeconomic History   Marital status: Single    Spouse name: Not on file   Number of children: Not on file   Years of education: Not on file   Highest education level:  Not on file  Occupational History   Not on file  Tobacco Use   Smoking status: Never   Smokeless tobacco: Never  Vaping Use   Vaping Use: Never used  Substance and Sexual Activity   Alcohol use: Not Currently    Alcohol/week: 1.0 standard drink of alcohol    Types: 1 Shots of liquor per week   Drug use: No   Sexual activity: Yes    Birth control/protection: None  Other Topics Concern   Not on file  Social History Narrative   Not on file   Social Determinants of Health   Financial Resource Strain: Low Risk  (05/26/2020)   Overall Financial Resource Strain (CARDIA)    Difficulty of Paying Living Expenses: Not very hard  Food Insecurity: No Food Insecurity (05/26/2020)   Hunger Vital Sign    Worried About Running Out of Food in the Last Year: Never true    Ran Out of Food in the Last Year: Never true  Transportation Needs: No Transportation  Needs (05/26/2020)   PRAPARE - Administrator, Civil Service (Medical): No    Lack of Transportation (Non-Medical): No  Physical Activity: Not on file  Stress: Not on file  Social Connections: Not on file    Allergies  Allergen Reactions   Ozempic (0.25 Or 0.5 Mg-Dose) [Semaglutide(0.25 Or 0.5mg -Dos)] Other (See Comments)    Severe abdominal pain   Jardiance [Empagliflozin] Rash    Outpatient Medications Prior to Visit  Medication Sig Dispense Refill   acetaminophen (TYLENOL) 500 MG tablet Take 500 mg by mouth every 6 (six) hours as needed.     aspirin 81 MG chewable tablet Chew 1 tablet (81 mg total) by mouth daily. 30 tablet 6   atorvastatin (LIPITOR) 80 MG tablet TAKE 1 TABLET (80 MG TOTAL) BY MOUTH EVERY EVENING. 90 tablet 6   Blood Glucose Monitoring Suppl (ONETOUCH VERIO) w/Device KIT Use to check blood sugar 3 times daily. 1 kit 0   carvedilol (COREG) 6.25 MG tablet TAKE 1 TABLET (6.25 MG TOTAL) BY MOUTH 2 (TWO) TIMES DAILY WITH A MEAL. 120 tablet 6   dapagliflozin propanediol (FARXIGA) 10 MG TABS tablet Take 1  tablet (10 mg total) by mouth daily. 30 tablet 11   Evolocumab (REPATHA SURECLICK) 140 MG/ML SOAJ Inject 140 mg into the skin every 14 (fourteen) days. 2 mL 11   glucose blood test strip Use to test blood sugar 3 times a day 100 each 0   insulin lispro (HUMALOG KWIKPEN) 100 UNIT/ML KwikPen Inject 10 Units into the skin 3 (three) times daily before meals. Hold if blood sugar is less than 150 15 mL 11   Insulin Pen Needle 31G X 5 MM MISC USE TO INJECT INSULIN AT BEDTIME. 100 each 6   sacubitril-valsartan (ENTRESTO) 97-103 MG Take 1 tablet by mouth 2 (two) times daily. 180 tablet 3   spironolactone (ALDACTONE) 50 MG tablet Take 1 tablet (50 mg total) by mouth daily. 90 tablet 3   furosemide (LASIX) 40 MG tablet Take 1 tablet (40 mg total) by mouth daily. 90 tablet 2   Insulin Glargine (BASAGLAR KWIKPEN) 100 UNIT/ML Inject 50 Units into the skin daily. 15 mL 2   omeprazole (PRILOSEC) 20 MG capsule Take 1 capsule (20 mg total) by mouth daily. 90 capsule 0   levofloxacin (LEVAQUIN) 750 MG tablet Take 1 tablet (750 mg total) by mouth daily for 7 days (Patient not taking: Reported on 05/05/2023) 7 tablet 0   Facility-Administered Medications Prior to Visit  Medication Dose Route Frequency Provider Last Rate Last Admin   vancomycin (VANCOCIN) powder    PRN Park Liter, DPM   1,000 mg at 01/24/21 1411     ROS Review of Systems  Constitutional:  Negative for activity change and appetite change.  HENT:  Negative for sinus pressure and sore throat.   Respiratory:  Negative for chest tightness, shortness of breath and wheezing.   Cardiovascular:  Negative for chest pain and palpitations.  Gastrointestinal:  Negative for abdominal distention, abdominal pain and constipation.  Genitourinary: Negative.   Musculoskeletal: Negative.   Skin:  Positive for wound.  Psychiatric/Behavioral:  Negative for behavioral problems and dysphoric mood.    Objective:  BP 116/82   Pulse 85   Ht 6\' 6"  (1.981 m)    Wt 284 lb 12.8 oz (129.2 kg)   SpO2 99%   BMI 32.91 kg/m      05/05/2023    8:47 AM 11/28/2022   10:19 AM 08/27/2022  2:55 PM  BP/Weight  Systolic BP 116 118 123  Diastolic BP 82 83 88  Wt. (Lbs) 284.8 278.6 264.4  BMI 32.91 kg/m2 32.2 kg/m2 30.55 kg/m2      Physical Exam Constitutional:      Appearance: He is well-developed.  Cardiovascular:     Rate and Rhythm: Normal rate.     Heart sounds: Normal heart sounds. No murmur heard. Pulmonary:     Effort: Pulmonary effort is normal.     Breath sounds: Normal breath sounds. No wheezing or rales.  Chest:     Chest wall: No tenderness.  Abdominal:     General: Bowel sounds are normal. There is no distension.     Palpations: Abdomen is soft. There is no mass.     Tenderness: There is no abdominal tenderness.  Musculoskeletal:     Right lower leg: No edema.     Left lower leg: No edema.     Comments: Left foot in an orthopedic shoe  Neurological:     Mental Status: He is alert and oriented to person, place, and time.  Psychiatric:        Mood and Affect: Mood normal.        Latest Ref Rng & Units 11/28/2022   11:33 AM 08/27/2022    3:37 PM 09/14/2021    9:50 AM  CMP  Glucose 70 - 99 mg/dL 578  469  629   BUN 6 - 24 mg/dL 25  31  24    Creatinine 0.76 - 1.27 mg/dL 5.28  4.13  2.44   Sodium 134 - 144 mmol/L 139  137  138   Potassium 3.5 - 5.2 mmol/L 4.8  5.3  4.2   Chloride 96 - 106 mmol/L 99  98  105   CO2 20 - 29 mmol/L 24  22  25    Calcium 8.7 - 10.2 mg/dL 01.0  9.6  9.3   Total Protein 6.0 - 8.5 g/dL  7.4    Total Bilirubin 0.0 - 1.2 mg/dL  0.5    Alkaline Phos 44 - 121 IU/L  77    AST 0 - 40 IU/L  16    ALT 0 - 44 IU/L  22      Lipid Panel     Component Value Date/Time   CHOL 184 08/27/2022 1537   TRIG 169 (H) 08/27/2022 1537   HDL 42 08/27/2022 1537   CHOLHDL 4.4 08/27/2022 1537   CHOLHDL 2.7 09/14/2021 0950   VLDL 19 09/14/2021 0950   LDLCALC 112 (H) 08/27/2022 1537   LDLDIRECT 117.0 06/14/2013  0826    CBC    Component Value Date/Time   WBC 9.0 08/27/2022 1537   WBC 11.1 (H) 04/20/2021 1550   RBC 5.22 08/27/2022 1537   RBC 4.67 04/20/2021 1550   HGB 14.5 08/27/2022 1537   HCT 44.2 08/27/2022 1537   PLT 223 08/27/2022 1537   MCV 85 08/27/2022 1537   MCH 27.8 08/27/2022 1537   MCH 25.9 (L) 04/20/2021 1550   MCHC 32.8 08/27/2022 1537   MCHC 32.2 04/20/2021 1550   RDW 12.2 08/27/2022 1537   LYMPHSABS 2.8 08/27/2022 1537   MONOABS 0.8 06/27/2020 0354   EOSABS 0.2 08/27/2022 1537   BASOSABS 0.1 08/27/2022 1537    Lab Results  Component Value Date   HGBA1C 12.4 (A) 05/05/2023    Assessment & Plan:  1. Uncontrolled type 2 diabetes mellitus with hyperglycemia (HCC) Uncontrolled with A1c of 12.4 Increase Basaglar dose for  50 units to 60 units nightly Continue Humalog He is due for an eye exam and needs to schedule this with his ophthalmologist. Counseled on Diabetic diet, my plate method, 956 minutes of moderate intensity exercise/week Blood sugar logs with fasting goals of 80-120 mg/dl, random of less than 213 and in the event of sugars less than 60 mg/dl or greater than 086 mg/dl encouraged to notify the clinic. Advised on the need for annual eye exams, annual foot exams, Pneumonia vaccine.  - POCT glucose (manual entry) - POCT glycosylated hemoglobin (Hb A1C) - Insulin Glargine (BASAGLAR KWIKPEN) 100 UNIT/ML; Inject 60 Units into the skin daily.  Dispense: 30 mL; Refill: 6  2. Ischemic cardiomyopathy Asymptomatic Continue risk factor modification Continue GDMT with SGLT2i, Entresto, spironolactone, beta-blocker - furosemide (LASIX) 40 MG tablet; Take 1 tablet (40 mg total) by mouth daily.  Dispense: 90 tablet; Refill: 1  3. Essential hypertension Controlled Continue antihypertensives Counseled on blood pressure goal of less than 130/80, low-sodium, DASH diet, medication compliance, 150 minutes of moderate intensity exercise per week. Discussed medication  compliance, adverse effects.  4. Diabetic ulcer of left midfoot associated with type 2 diabetes mellitus, limited to breakdown of skin (HCC) Unfortunately his poor glycemic control is delaying wound healing Continue to follow-up with the wound care    Meds ordered this encounter  Medications   Insulin Glargine (BASAGLAR KWIKPEN) 100 UNIT/ML    Sig: Inject 60 Units into the skin daily.    Dispense:  30 mL    Refill:  6    Dose increase   furosemide (LASIX) 40 MG tablet    Sig: Take 1 tablet (40 mg total) by mouth daily.    Dispense:  90 tablet    Refill:  1   omeprazole (PRILOSEC) 20 MG capsule    Sig: Take 1 capsule (20 mg total) by mouth daily.    Dispense:  90 capsule    Refill:  1    Follow-up: Return in about 3 months (around 08/05/2023) for Medical conditions with PCP.       Hoy Register, MD, FAAFP. [facility] [place], [place] [phone]   05/05/2023, 10:20 AM

## 2023-05-05 NOTE — Progress Notes (Signed)
Discuss diabetes medication.

## 2023-05-08 ENCOUNTER — Encounter (HOSPITAL_BASED_OUTPATIENT_CLINIC_OR_DEPARTMENT_OTHER): Payer: 59 | Attending: Internal Medicine | Admitting: Internal Medicine

## 2023-05-08 DIAGNOSIS — Z794 Long term (current) use of insulin: Secondary | ICD-10-CM | POA: Diagnosis not present

## 2023-05-08 DIAGNOSIS — N189 Chronic kidney disease, unspecified: Secondary | ICD-10-CM | POA: Insufficient documentation

## 2023-05-08 DIAGNOSIS — I2510 Atherosclerotic heart disease of native coronary artery without angina pectoris: Secondary | ICD-10-CM | POA: Diagnosis not present

## 2023-05-08 DIAGNOSIS — E785 Hyperlipidemia, unspecified: Secondary | ICD-10-CM | POA: Insufficient documentation

## 2023-05-08 DIAGNOSIS — Z89422 Acquired absence of other left toe(s): Secondary | ICD-10-CM | POA: Diagnosis not present

## 2023-05-08 DIAGNOSIS — E11621 Type 2 diabetes mellitus with foot ulcer: Secondary | ICD-10-CM | POA: Diagnosis not present

## 2023-05-08 DIAGNOSIS — I130 Hypertensive heart and chronic kidney disease with heart failure and stage 1 through stage 4 chronic kidney disease, or unspecified chronic kidney disease: Secondary | ICD-10-CM | POA: Diagnosis not present

## 2023-05-08 DIAGNOSIS — Z8249 Family history of ischemic heart disease and other diseases of the circulatory system: Secondary | ICD-10-CM | POA: Insufficient documentation

## 2023-05-08 DIAGNOSIS — I5042 Chronic combined systolic (congestive) and diastolic (congestive) heart failure: Secondary | ICD-10-CM | POA: Insufficient documentation

## 2023-05-08 DIAGNOSIS — L97522 Non-pressure chronic ulcer of other part of left foot with fat layer exposed: Secondary | ICD-10-CM | POA: Diagnosis not present

## 2023-05-08 DIAGNOSIS — Z955 Presence of coronary angioplasty implant and graft: Secondary | ICD-10-CM | POA: Insufficient documentation

## 2023-05-08 DIAGNOSIS — E1122 Type 2 diabetes mellitus with diabetic chronic kidney disease: Secondary | ICD-10-CM | POA: Diagnosis not present

## 2023-05-09 NOTE — Progress Notes (Signed)
Glenn, [name] ([id number]) [id number] Page 1 of 7 Visit Report for 05/08/2023 Arrival Information Details Patient Name: Date of Service: [name] [name], [name] RO LD 05/08/2023 9:30 A M Medical Record Number: [id number] Patient Account Number: [id number] Date of Birth/Sex: Treating RN: [name] (52 y.o. [name], [name] Primary Care [name]: [name] Other Clinician: Referring [name]: Treating [name]/Extender: [name] in Treatment: 14 Visit Information History Since Last Visit Added or deleted any medications: No Patient Arrived: Ambulatory Any new allergies or adverse reactions: No Arrival Time: 09:20 Had a fall or experienced change in No Accompanied By: self activities of daily living that may affect Transfer Assistance: None risk of falls: Patient Identification Verified: Yes Signs or symptoms of abuse/neglect since last visito No Secondary Verification Process Completed: Yes Hospitalized since last visit: No Patient Requires Transmission-Based Precautions: No Implantable device outside of the clinic excluding No Patient Has Alerts: Yes cellular tissue based products placed in the center Patient Alerts: 08/2021 ABI:1.2 TBI 0.79 since last visit: Has Dressing in Place as Prescribed: Yes Pain Present Now: No Electronic Signature(s) Signed: 05/08/2023 5:46:17 PM By: Glenn Stall RN, BSN Entered By: [name] on 05/08/2023 09:21:03 -------------------------------------------------------------------------------- Encounter Discharge Information Details Patient Name: Date of Service: [name], [name] 05/08/2023 9:30 A M Medical Record Number: [id number] Patient Account Number: [id number] Date of Birth/Sex: Treating RN: [name] (52 y.o. [name] Primary Care Mayola Mcbain: [name] Other Clinician: Referring [name]: Treating [name]/Extender: [name] in  Treatment: 14 Encounter Discharge Information Items Post Procedure Vitals Discharge Condition: Stable Temperature (F): 98.2 Ambulatory Status: Ambulatory Pulse (bpm): 87 Discharge Destination: Home Respiratory Rate (breaths/min): 20 Transportation: Private Auto Blood Pressure (mmHg): 111/77 Accompanied By: self Schedule Follow-up Appointment: Yes Clinical Summary of Care: Patient Declined Electronic Signature(s) Signed: 05/09/2023 1:01:52 PM By: Glenn Schwalbe RN Entered By: [name] on 05/09/2023 11:44:40 -------------------------------------------------------------------------------- Lower Extremity Assessment Details Patient Name: Date of Service: [name] [name], [name] RO LD 05/08/2023 9:30 A M Medical Record Number: [id number] Patient Account Number: [id number] Date of Birth/Sex: Treating RN: [name] (52 y.o. [name] Primary Care Calix Heinbaugh: [name] Other Clinician: Referring Lakeria Starkman: Treating [name]/Extender: [name] in Treatment: 14 Edema Assessment Assessed: [name]: Yes] [name]: No] F[LeftVirginia [name] ([id number])] [Right: [id number] Page 2 of 7] Edema: [Left: N] [Right: o] Calf Left: Right: Point of Measurement: 43 cm From Medial Instep 37 cm Ankle Left: Right: Point of Measurement: 9 cm From Medial Instep 22 cm Vascular Assessment Pulses: Dorsalis Pedis Palpable: [Left:Yes] Electronic Signature(s) Signed: 05/08/2023 5:46:17 PM By: Glenn Stall RN, BSN Entered By: [name] on 05/08/2023 09:23:00 -------------------------------------------------------------------------------- Multi Wound Chart Details Patient Name: Date of Service: [name], [name] 05/08/2023 9:30 A M Medical Record Number: [id number] Patient Account Number: [id number] Date of Birth/Sex: Treating RN: [name] (52 y.o. M) Primary Care [name]: [name] Other Clinician: Referring [name] [name]: Treating  [name]/Extender: [name] in Treatment: 14 Vital Signs Height(in): 78 Capillary Blood Glucose(mg/dl): 093 Weight(lbs): 235 Pulse(bpm): 87 Body Mass Index(BMI): 31.8 Blood Pressure(mmHg): 111/77 Temperature(F): 98.2 Respiratory Rate(breaths/min): 20 [3:Photos:] [N/A:N/A] Left, Plantar Foot N/A N/A Wound Location: Gradually Appeared N/A N/A Wounding Event: Diabetic Wound/Ulcer of the Lower N/A N/A Primary Etiology: Extremity Chronic sinus problems/congestion, N/A N/A Comorbid History: Congestive Heart Failure, Coronary Artery Disease, Hypertension, Myocardial Infarction, Type II Diabetes, Neuropathy 10/01/2022 N/A N/A Date Acquired: 14 N/A N/A Weeks of Treatment: Open N/A N/A Wound Status: No N/A  N/A Wound Recurrence: Yes N/A N/A Pending [name] on Presentation: 1.1x1x0.2 N/A N/A Measurements L x W x D (cm) 0.864 N/A N/A A (cm) : rea 0.173 N/A N/A Volume (cm) : 78.00% N/A N/A % Reduction in A rea: 78.00% N/A N/A % Reduction in Volume: Grade 1 N/A N/A Classification: Medium N/A N/A Exudate A mount: Serosanguineous N/A N/A Exudate Type: red, brown N/A N/A Exudate Color: Thickened N/A N/A Wound Margin: Glenn, [name] ([id number]) [id number] Page 3 of 7 Large (67-100%) N/A N/A Granulation Amount: Red N/A N/A Granulation Quality: Small (1-33%) N/A N/A Necrotic Amount: Fat Layer (Subcutaneous Tissue): Yes N/A N/A Exposed Structures: Fascia: No Tendon: No Muscle: No Joint: No Bone: No Small (1-33%) N/A N/A Epithelialization: Debridement - Selective/Open Wound N/A N/A Debridement: Pre-procedure Verification/Time Out 10:01 N/A N/A Taken: Lidocaine 4% Topical Solution N/A N/A Pain Control: Slough N/A N/A Tissue Debrided: Non-Viable Tissue N/A N/A Level: 0.86 N/A N/A Debridement A (sq cm): rea Curette N/A N/A Instrument: Minimum N/A N/A Bleeding: Pressure N/A  N/A Hemostasis A chieved: 0 N/A N/A Procedural Pain: 0 N/A N/A Post Procedural Pain: Procedure was tolerated well N/A N/A Debridement Treatment Response: 1.1x1x0.2 N/A N/A Post Debridement Measurements L x W x D (cm) 0.173 N/A N/A Post Debridement Volume: (cm) Callus: Yes N/A N/A Periwound Skin Texture: Excoriation: No Induration: No Crepitus: No Rash: No Scarring: No Maceration: No N/A N/A Periwound Skin Moisture: Dry/Scaly: No Atrophie Blanche: No N/A N/A Periwound Skin Color: Cyanosis: No Ecchymosis: No Erythema: No Hemosiderin Staining: No Mottled: No Pallor: No Rubor: No No Abnormality N/A N/A Temperature: Debridement N/A N/A Procedures Performed: Treatment Notes Electronic Signature(s) Signed: 05/08/2023 12:25:07 PM By: Geralyn Corwin DO Entered By: Geralyn Corwin on 05/08/2023 10:26:47 -------------------------------------------------------------------------------- Multi-Disciplinary Care Plan Details Patient Name: Date of Service: [name], [name] 05/08/2023 9:30 A M Medical Record Number: [id number] Patient Account Number: [id number] Date of Birth/Sex: Treating RN: [birth year] [name] (52 y.o. [name] Primary Care Sherronda Sweigert: [name] Other Clinician: Referring Laurisa Sahakian: Treating Shamicka Inga/Extender: [name] in Treatment: 14 Active Inactive Wound/Skin Impairment Nursing Diagnoses: Impaired tissue integrity Knowledge deficit related to ulceration/compromised skin integrity Goals: Patient will have a decrease in wound volume by X% from date: (specify in notes) Date Initiated: 01/24/2023 Target Resolution Date: 07/30/2023 Goal Status: Active Patient/caregiver will verbalize understanding of skin care regimen Date Initiated: 01/24/2023 Target Resolution Date: 07/30/2023 Goal Status: Active DYVON, DURRETT ([id number]) [id number] Page 4 of 7 Ulcer/skin breakdown will have a volume  reduction of 30% by week 4 Date Initiated: 01/24/2023 Date Inactivated: 03/07/2023 Target Resolution Date: 02/27/2023 Goal Status: Unmet Unmet Reason: larger today. Ulcer/skin breakdown will have a volume reduction of 50% by week 8 Date Initiated: 01/24/2023 Target Resolution Date: 07/30/2023 Goal Status: Active Interventions: Assess patient/caregiver ability to obtain necessary supplies Assess patient/caregiver ability to perform ulcer/skin care regimen upon admission and as needed Assess ulceration(s) every visit Notes: Electronic Signature(s) Signed: 05/09/2023 1:01:52 PM By: Glenn Schwalbe RN Entered By: [name] on 05/09/2023 11:43:11 -------------------------------------------------------------------------------- Pain Assessment Details Patient Name: Date of Service: [name], [name] 05/08/2023 9:30 A M Medical Record Number: [id number] Patient Account Number: [id number] Date of Birth/Sex: Treating RN: [birth year] (52 y.o. [name] Primary Care Vanden Fawaz: [name] Other Clinician: Referring Sailor Haughn: Treating Reita Shindler/Extender: [name] in Treatment: 14 Active Problems Location of Pain Severity and Description of Pain Patient Has Paino No Site Locations Pain Management and Medication Current Pain Management: Electronic Signature(s)  Signed: 05/08/2023 5:46:17 PM By: Glenn Stall RN, BSN Entered By: [name] on 05/08/2023 09:21:13 -------------------------------------------------------------------------------- Patient/Caregiver Education Details Patient Name: Date of Service: [name], [name] 5/9/2024andnbsp9:30 A M Medical Record Number: [id number] Patient Account Number: [id number] Date of Birth/Gender: Treating RN: [name] (52 y.o. [name] Primary Care Physician: [name] Other Clinician: Referring Physician: Treating Physician/Extender: [name] in  Treatment: [address], [place] ([id number]) [id number] Page 5 of 7 Education Assessment Education Provided To: Patient Education Topics Provided Wound/Skin Impairment: Methods: Explain/Verbal Responses: Return demonstration correctly Electronic Signature(s) Signed: 05/09/2023 1:01:52 PM By: Glenn Schwalbe RN Entered By: [name] on 05/09/2023 11:43:25 -------------------------------------------------------------------------------- Wound Assessment Details Patient Name: Date of Service: [name], [name] 05/08/2023 9:30 A M Medical Record Number: [id number] Patient Account Number: [id number] Date of Birth/Sex: Treating RN: [name] (52 y.o. [name], [name] Primary Care Carri Spillers: [name] Other Clinician: Referring Timothee Gali: Treating Yumna Ebers/Extender: [name] in Treatment: 14 Wound Status Wound Number: 3 Primary Diabetic Wound/Ulcer of the Lower Extremity Etiology: Wound Location: Left, Plantar Foot Wound Open Wounding Event: Gradually Appeared Status: Date Acquired: 10/01/2022 Comorbid Chronic sinus problems/congestion, Congestive Heart Failure, Weeks Of Treatment: 14 History: Coronary Artery Disease, Hypertension, Myocardial Infarction, Type Clustered Wound: No II Diabetes, Neuropathy Pending Amputation On Presentation Wound under treatment by Jaiceon Collister outside of Wound Center Photos Wound Measurements Length: (cm) 1.1 Width: (cm) 1 Depth: (cm) 0.2 Area: (cm) 0.864 Volume: (cm) 0.173 % Reduction in Area: 78% % Reduction in Volume: 78% Epithelialization: Small (1-33%) Tunneling: No Undermining: No Wound Description Classification: Grade 1 Wound Margin: Thickened Exudate Amount: Medium Exudate Type: Serosanguineous Exudate Color: red, brown Foul Odor After Cleansing: No Slough/Fibrino Yes Wound Bed Granulation Amount: Large (67-100%) Exposed Structure Granulation Quality: Red Fascia  Exposed: No Necrotic Amount: Small (1-33%) Fat Layer (Subcutaneous Tissue) Exposed: Yes Necrotic Quality: Adherent Slough Tendon Exposed: No Muscle Exposed: No WEI, UEHARA ([id number]) [id number] Page 6 of 7 Joint Exposed: No Bone Exposed: No Periwound Skin Texture Texture Color No Abnormalities Noted: No No Abnormalities Noted: No Callus: Yes Atrophie Blanche: No Crepitus: No Cyanosis: No Excoriation: No Ecchymosis: No Induration: No Erythema: No Rash: No Hemosiderin Staining: No Scarring: No Mottled: No Pallor: No Moisture Rubor: No No Abnormalities Noted: No Dry / Scaly: No Temperature / Pain Maceration: No Temperature: No Abnormality Treatment Notes Wound #3 (Foot) Wound Laterality: Plantar, Left Cleanser Wound Cleanser Discharge Instruction: Cleanse the wound with wound cleanser prior to applying a clean dressing using gauze sponges, not tissue or cotton balls. Peri-Wound Care Zinc Oxide Ointment 30g tube Discharge Instruction: Apply Zinc Oxide to periwound with each dressing change Topical Primary Dressing Promogran Prisma Matrix, 4.34 (sq in) (silver collagen) Discharge Instruction: Moisten collagen with saline and apply over Blastix in the wound bed 2x2 gauze Discharge Instruction: sock gauze with vashe. Blastix Discharge Instruction: apply to wound bed Secondary Dressing ABD Pad, 5x9 Discharge Instruction: ABD pad donut. Secured With [employer], 4.5x3.1 (in/yd) Discharge Instruction: Secure with Kerlix as directed. 28M Medipore H Soft Cloth Surgical T ape, 4 x 10 (in/yd) Discharge Instruction: Secure with tape as directed. Compression Wrap Compression Stockings Add-Ons Electronic Signature(s) Signed: 05/08/2023 5:46:17 PM By: Glenn Stall RN, BSN Signed: 05/09/2023 1:01:52 PM By: Glenn Schwalbe RN Entered By: [name] on 05/08/2023  09:57:35 -------------------------------------------------------------------------------- Vitals Details Patient Name: Date of Service: [name] [place], [name] 05/08/2023 9:30 A M Medical Record Number: [id number] Patient Account Number: [id number]  Date of Birth/Sex: Treating RN: [name] (52 y.o. [name] Primary Care Aliya Sol: [name] Other Clinician: Referring Nishita Isaacks: Treating Phylliss Strege/Extender: [name] in Treatment: [address] [place], [name] ([id number]) [id number] Page 7 of 7 Time Taken: 09:23 Temperature (F): 98.2 Height (in): 78 Pulse (bpm): 87 Weight (lbs): 275 Respiratory Rate (breaths/min): 20 Body Mass Index (BMI): 31.8 Blood Pressure (mmHg): 111/77 Capillary Blood Glucose (mg/dl): 409 Reference Range: 80 - 120 mg / dl Electronic Signature(s) Signed: 05/08/2023 5:46:17 PM By: Glenn Stall RN, BSN Entered By: [name] on 05/08/2023 09:23:17

## 2023-05-09 NOTE — Progress Notes (Signed)
KIPTEN, AUSTEN ([id number]) [id number] Page 1 of 10 Visit Report for 05/08/2023 Chief Complaint Document Details Patient Name: Date of Service: [name] [address], [name] RO LD 05/08/2023 9:30 A M Medical Record Number: [id number] Patient Account Number: [id number] Date of Birth/Sex: Treating RN: May 07, 1971 (52 y.o. M) Primary Care Provider: [name] Other Clinician: Referring Provider: Treating Provider/Extender: [name] in Treatment: 14 Information Obtained from: Patient Chief Complaint 01/15/2023; left foot wound Electronic Signature(s) Signed: 05/08/2023 12:25:07 PM By: Geralyn Corwin DO Entered By: Geralyn Corwin on 05/08/2023 10:26:54 -------------------------------------------------------------------------------- Debridement Details Patient Name: Date of Service: [name] [place], [name] 05/08/2023 9:30 A M Medical Record Number: [id number] Patient Account Number: [id number] Date of Birth/Sex: Treating RN: 02-24-1971 (52 y.o. [name] Primary Care Provider: [name] Other Clinician: Referring Provider: Treating Provider/Extender: [name] in Treatment: 14 Debridement Performed for Assessment: Wound #3 Left,Plantar Foot Performed By: Physician Geralyn Corwin, DO Debridement Type: Debridement Severity of Tissue Pre Debridement: Fat layer exposed Level of Consciousness (Pre-procedure): Awake and Alert Pre-procedure Verification/Time Out Yes - 10:01 Taken: Start Time: 10:01 Pain Control: Lidocaine 4% T opical Solution Percent of Wound Bed Debrided: 100% T Area Debrided (cm): otal 0.86 Tissue and other material debrided: Non-Viable, Slough, Slough Level: Non-Viable Tissue Debridement Description: Selective/Open Wound Instrument: Curette Bleeding: Minimum Hemostasis Achieved: Pressure End Time: 10:02 Procedural Pain: 0 Post Procedural Pain: 0 Response to Treatment:  Procedure was tolerated well Level of Consciousness (Post- Awake and Alert procedure): Post Debridement Measurements of Total Wound Length: (cm) 1.1 Width: (cm) 1 Depth: (cm) 0.2 Volume: (cm) 0.173 Character of Wound/Ulcer Post Debridement: Improved Severity of Tissue Post Debridement: Fat layer exposed Post Procedure Diagnosis Same as Pre-procedure Notes Scribed for Dr. Mikey Bussing by J.Scotton Morton, [name] ([id number]) [id number] Page 2 of 10 Electronic Signature(s) Signed: 05/08/2023 12:25:07 PM By: Geralyn Corwin DO Signed: 05/09/2023 1:01:52 PM By: Karie Schwalbe RN Entered By: Karie Schwalbe on 05/08/2023 10:06:39 -------------------------------------------------------------------------------- HPI Details Patient Name: Date of Service: [name] IN, [name] 05/08/2023 9:30 A M Medical Record Number: [id number] Patient Account Number: [id number] Date of Birth/Sex: Treating RN: April 10, 1971 (52 y.o. M) Primary Care Provider: [name] Other Clinician: Referring Provider: Treating Provider/Extender: [name] in Treatment: 14 History of Present Illness HPI Description: ADMISSION 01/11/2020 This is a 52 year old man with uncontrolled type 2 diabetes with a recent hemoglobin A1c earlier this year of 13.4. He is on insulin and glipizide. He does not take his blood sugars at home. He does have a follow-up with primary care later this month I believe on January 27. He tells [place] that roughly a month ago he was walking with a shoe with a hole in his foot. He took the shoe off and there was an open wound at roughly the left fourth met head. This has significant undermining and raised edges. He has not noticed any purulence he does not feel unwell. More recently he was taking skin off the bottom of his foot and has a superficial area on the left fifth met head. He has not been offloading this. The patient was in the ER on 12/20.  They gave him Bactroban which she has been using on the wound and 10 days worth of doxycycline. No x-rays were done. He has not had vascular studies. He is also been using hydrogen peroxide. Past medical history type 2 diabetes uncontrolled, chronic systolic heart failure, coronary artery disease with a history of congestive heart failure  with stents. Hypertension hyperlipidemia and chronic renal insufficiency ABI in this clinic was 1.14 on the left. 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 He is uncertain whether he would be able to work if we put him on some form of restriction 1/19; he is generally doing quite well. Using silver alginate on the wounds. Things actually look better. He has a forefoot offloading boot which she seems to be compliant about. He has support at work to stay off his foot is much as possible which is gratifying. Culture I did last week showed a few Enterococcus faecalis. I am going to put him on Augmentin. I talked about ordering an x-ray in my note last week but that does not seem to have happened. We will review reorder the x-ray this week. 1/26; x-ray reordered last week was negative for osteomyelitis. We are using silver alginate on the wound on the third and fifth met heads on the left. He is using a Darco forefoot off loader 2/2; the area on the fifth met head is closed. Third met head is still open with tunneling depth and thick callus. 2/9; the area on the fifth met head remains closed however the third met head again has a small open area on presentation with tunneling in depth and surrounded by thick callus. This looks like a pressure issue. We have been using silver alginate 2/16; the area of the fifth metatarsal head remains closed however the area over the third metatarsal head again is a small open area but with some depth. I do not think this is changed much since last week. He is using Hydrofera Blue with forefoot off loader. He  is not able to use a total contact cast on the left leg because he needs his left leg at work [employer]]. Fortunately the wound does not look infected. I changed him to endoform today 2/26; the area of the fifth metatarsal head remains closed. The area of the third metatarsal head has an even smaller opening this time. I used endoform on this this looks improved. He is offloading this is much as he can and a forefoot off loader on the left. He cannot have a total contact cast because of work responsibilities 3/5; the area on the fifth metatarsal head remains closed the area on the third metatarsal head is also closed on the left foot. 01/15/2023 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 He has been following for podiatry for several months for this issue. He is currently using wet-to-dry saline dressings. He currently denies signs of infection. Progresse note from 1/17; Patient presents with a 90-month history of nonhealing ulcer to the left foot secondary to diabetes and inability to offload well the area. He has had multiple debridements in the past to his feet bilaterally. He has had resection of the fourth left met head in the fifth toe Secondary to osteomyelitis. We discussed the importance of glycemic control for wound healing. Due to his blood glucose levels being elevated he is at high risk for infection and thus further amputation. He expressed understanding. He states he is supposed to be referred to an endocrinologist at [facility]  however the referral fell through. Unclear what happened. Offered a referral to endocrinology at [facility]. Patient  was agreeable.Furthermore we discussed the importance of aggressive offloading for his wound healing. This will be the most difficult part of the treatment plan for the patient to do. We discussed a total contact cast however he has declined that at this time. 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 He is currently using a surgical shoe with a peg assist. It does not fit well so we will give him a new one today. I do not think this is enough offloading. If he is not able to offload this area he will likely end up with a BKA. He is well aware of this. For now I recommended Medihoney and Hydrofera Blue for dressing changes. He will follow-up in our [place] office Since this is closer for him. 1/26; patient presents for follow-up. Last clinic visit I had seen him in [place] and we transferred him to [place] since this is a closer location for him. Progress note above from that visit. Over the past week He has been using Medihoney and Hydrofera Blue to the wound bed. He has been using his surgical shoe with peg assist. He has no issues or complaints today. We discussed doing the total contact cast and he was agreeable to having this placed at next clinic visit. 2/2; patient presents for follow-up. He has been using Vashe wet-to-dry dressings. Plan is for the total contact cast today. 2/5; patient presents for follow-up. The cast was placed 3 days ago. He tolerated this well however had a lot of drainage. He currently denies signs of infection. 2/9; patient presents for follow-up. At last clinic visit we held off on replacing the cast due to drainage. He has been using Vashe wet-to-dry dressings and he is currently taking the oral antibiotics prescribed without issues. He declines a total contact cast today. We ran insurance verification for skin substitute and due to cost patient declines having this placed. [name], [name] ([id number]) [id number] Page 3 of 10 2/16; patient presents for follow-up. He has decided not to follow-up with podiatry. He continues to decline the total contact cast. He has been using Vashe wet- to-dry dressings. He has not been wearing his surgical shoe with peg assist. He currently denies signs of infection. 3/1; patient presents for follow-up. He has been doing Vashe wet-to-dry dressings. He has a surgical shoe with peg assist. We discussed potentially doing a skin substitute for which she has been approved for by insurance. He knows the out-of-pocket cost of this and would still like to proceed with having this placed today. He denies the current total contact cast. 3/8; patient presents for follow-up. He states that the skin substitute came off after a few days and he has been using Vashe wet-to-dry dressings. He has mild odor to the wound bed on exam. 3/15; patient presents for follow-up. He has been using Vashe wet-to-dry dressings and started his course of Augmentin. He reports improvement in wound healing. He has no issues or complaints today. 3/21; patient presents for follow-up. He continues to use Vashe wet-to-dry dressings. He has no issues or complaints today. 3/28; patient presents for follow-up. He has been using Vashe wet-to-dry dressings. He has no issues or complaints today. He has been using his peg assist with surgical shoe. 4/11; patient presents for follow-up. He has been using  Vashe wet-to-dry dressings. The periwound is macerated. He has been using his peg assist with surgical shoe. He has no issues or complaints today. 4/25; patient presents for follow-up. Has been using Vashe wet-to-dry dressings. He has been using zinc oxide to the periwound. For offloading he has been using his peg assist in surgical shoe. He has no issues or complaints today. 5/9; patient presents for follow-up. He has been using Vashe wet-to-dry dressings. He has  no issues or complaints today. He has a surgical shoe with peg assist for offloading. Electronic Signature(s) Signed: 05/08/2023 12:25:07 PM By: Geralyn Corwin DO Entered By: Geralyn Corwin on 05/08/2023 10:27:24 -------------------------------------------------------------------------------- Physical Exam Details Patient Name: Date of Service: [name] IN, [name] 05/08/2023 9:30 A M Medical Record Number: [id number] Patient Account Number: [id number] Date of Birth/Sex: Treating RN: [birth year] (52 y.o. M) Primary Care Provider: [name] Other Clinician: Referring Provider: Treating Provider/Extender: [name] in Treatment: 14 Constitutional respirations regular, non-labored and within target range for patient.. Cardiovascular 2+ dorsalis pedis/posterior tibialis pulses. Psychiatric pleasant and cooperative. Notes Left foot: T the lateral aspect, fifth met head there is an open wound with granulation tissue And slough accumulation. No signs of infection. o Electronic Signature(s) Signed: 05/08/2023 12:25:07 PM By: Geralyn Corwin DO Entered By: Geralyn Corwin on 05/08/2023 10:27:58 -------------------------------------------------------------------------------- Physician Orders Details Patient Name: Date of Service: [name] IN, [name] 05/08/2023 9:30 A M Medical Record Number: [id number] Patient Account Number: [id number] Date of Birth/Sex: Treating RN: 03-18-1971 (52 y.o. [name] Primary Care Provider: [name] Other Clinician: Referring Provider: Treating Provider/Extender: [name] in Treatment: [phone] Verbal / Phone Orders: No Diagnosis Coding Follow-up Appointments ppointment in 2 weeks. - Dr. Mikey Bussing and [facility] Room # [address] [place], [place] ([id number]) [id number] Page 4 of 10 Cellular or Tissue Based Products Cellular or Tissue Based Product Type: -  apligraf and grafix 50% coinsurance 02/28/23-apply 1st Grafix 03/07/2023 HOLD THIS WEEK. Bathing/ Shower/ Hygiene May shower with protection but do not get wound dressing(s) wet. Protect dressing(s) with water repellant cover (for example, large plastic bag) or a cast cover and may then take shower. Off-Loading Other: - keep pressure off left lateral foot; use peg assist as insert in surgical shoe Additional Orders / Instructions Other: - 05/08/23 If the Blastix causes Redness and/or pain please discontinue the usage of Blastix and resume Vashe wet to dry dressings Wound Treatment Wound #3 - Foot Wound Laterality: Plantar, Left Cleanser: Wound Cleanser 1 x Per Day/15 Days Discharge Instructions: Cleanse the wound with wound cleanser prior to applying a clean dressing using gauze sponges, not tissue or cotton balls. Peri-Wound Care: Zinc Oxide Ointment 30g tube 1 x Per Day/15 Days Discharge Instructions: Apply Zinc Oxide to periwound with each dressing change Prim Dressing: Promogran Prisma Matrix, 4.34 (sq in) (silver collagen) 1 x Per Day/15 Days ary Discharge Instructions: Moisten collagen with saline and apply over Blastix in the wound bed Prim Dressing: 2x2 gauze 1 x Per Day/15 Days ary Discharge Instructions: sock gauze with vashe. Prim Dressing: Blastix 1 x Per Day/15 Days ary Discharge Instructions: apply to wound bed Secondary Dressing: ABD Pad, 5x9 1 x Per Day/15 Days Discharge Instructions: ABD pad donut. Secured With: [employer], 4.5x3.1 (in/yd) 1 x Per Day/15 Days Discharge Instructions: Secure with Kerlix as directed. Secured With: 87M Medipore H Soft Cloth Surgical T ape, 4 x 10 (in/yd) 1 x Per Day/15 Days Discharge Instructions: Secure with  tape as directed. Electronic Signature(s) Signed: 05/08/2023 12:25:07 PM By: Geralyn Corwin DO Entered By: Geralyn Corwin on 05/08/2023  10:28:06 -------------------------------------------------------------------------------- Problem List Details Patient Name: Date of Service: [name] IN, [name] 05/08/2023 9:30 A M Medical Record Number: [id number] Patient Account Number: [id number] Date of Birth/Sex: Treating RN: 20-Jan-1971 (52 y.o. M) Primary Care Provider: [name] Other Clinician: Referring Provider: Treating Provider/Extender: [name] in Treatment: 14 Active Problems ICD-10 Encounter Code Description Active Date MDM Diagnosis [phone] Non-pressure chronic ulcer of other part of left foot with fat layer exposed 01/24/2023 No Yes E11.621 Type 2 diabetes mellitus with foot ulcer 01/24/2023 No Yes Z89.422 Acquired absence of other left toe(s) 01/24/2023 No Yes Glenn, [name] ([id number]) [id number] Page 5 of 10 Inactive Problems Resolved Problems Electronic Signature(s) Signed: 05/08/2023 12:25:07 PM By: Geralyn Corwin DO Entered By: Geralyn Corwin on 05/08/2023 10:26:41 -------------------------------------------------------------------------------- Progress Note Details Patient Name: Date of Service: [name] IN, [name] 05/08/2023 9:30 A M Medical Record Number: [id number] Patient Account Number: [id number] Date of Birth/Sex: Treating RN: [birth year] (52 y.o. M) Primary Care Provider: [name] Other Clinician: Referring Provider: Treating Provider/Extender: [name] in Treatment: 14 Subjective Chief Complaint Information obtained from Patient 01/15/2023; left foot wound History of Present Illness (HPI) ADMISSION 01/11/2020 This is a 52 year old man with uncontrolled type 2 diabetes with a recent hemoglobin A1c earlier this year of 13.4. He is on insulin and glipizide. He does not take his blood sugars at home. He does have a follow-up with primary care later this month I believe on January 27. He tells [place]  that roughly a month ago he was walking with a shoe with a hole in his foot. He took the shoe off and there was an open wound at roughly the left fourth met head. This has significant undermining and raised edges. He has not noticed any purulence he does not feel unwell. More recently he was taking skin off the bottom of his foot and has a superficial area on the left fifth met head. He has not been offloading this. The patient was in the ER on 12/20. They gave him Bactroban which she has been using on the wound and 10 days worth of doxycycline. No x-rays were done. He has not had vascular studies. He is also been using hydrogen peroxide. Past medical history type 2 diabetes uncontrolled, chronic systolic heart failure, coronary artery disease with a history of congestive heart failure with stents. Hypertension hyperlipidemia and chronic renal insufficiency ABI in this clinic was 1.14 on the left. 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 He is uncertain whether he would be able to work if we put him on some form of restriction 1/19; he is generally doing quite well. Using silver alginate on the wounds. Things actually look better. He has a forefoot offloading boot which she seems to be compliant about. He has support at work to stay off his foot is much as possible which is gratifying. Culture I did last week showed a few Enterococcus faecalis. I am going to put him on Augmentin. I talked about ordering an x-ray in my note last week but that does not seem to have happened. We will review reorder the x-ray this week. 1/26; x-ray reordered last week was negative for osteomyelitis. We are using silver alginate on the wound on the third and fifth met heads on the left. He is using a  Darco forefoot off loader 2/2; the area on the fifth met head is closed. Third met head is still open with tunneling depth and thick callus. 2/9; the area on the fifth met head remains closed  however the third met head again has a small open area on presentation with tunneling in depth and surrounded by thick callus. This looks like a pressure issue. We have been using silver alginate 2/16; the area of the fifth metatarsal head remains closed however the area over the third metatarsal head again is a small open area but with some depth. I do not think this is changed much since last week. He is using Hydrofera Blue with forefoot off loader. He is not able to use a total contact cast on the left leg because he needs his left leg at work [employer]]. Fortunately the wound does not look infected. I changed him to endoform today 2/26; the area of the fifth metatarsal head remains closed. The area of the third metatarsal head has an even smaller opening this time. I used endoform on this this looks improved. He is offloading this is much as he can and a forefoot off loader on the left. He cannot have a total contact cast because of work responsibilities 3/5; the area on the fifth metatarsal head remains closed the area on the third metatarsal head is also closed on the left foot. 01/15/2023 Mr. 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 He has been following for podiatry for several months for this issue. He is currently using wet-to-dry saline dressings. He currently denies signs of infection. Progresse note from 1/17; Patient presents with a 27-month history of nonhealing ulcer to the left foot secondary to diabetes and inability to offload well the area. He has had multiple debridements in the past to his feet bilaterally. He has had resection  of the fourth left met head in the fifth toe Secondary to osteomyelitis. We discussed the importance of glycemic control for wound healing. Due to his blood glucose levels being elevated he is at high risk for infection and thus further amputation. He expressed understanding. He states he is supposed to be referred to an endocrinologist at [facility] however the referral fell through. Unclear what happened. Offered a referral to endocrinology at [facility]. Patient was agreeable.Furthermore we discussed the importance of aggressive offloading for his wound healing. This will be the most difficult part of the treatment plan for the patient to do. We discussed a total contact cast however he has declined that at this time. 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 He is currently using a surgical shoe with a peg assist. It does not fit well so we will give him a new one today. I do not think this is enough offloading. If he is not able to offload this area he will likely end up with a BKA. He is well aware of this. For now I recommended Medihoney and Hydrofera Blue for dressing changes. He will follow-up in our [place] office Since this is closer for him. 1/26; patient presents for follow-up. Last  clinic visit I had seen him in [place] and we transferred him to [place] since this is a closer location for him. Progress note above from that visit. Over the past week He has been using Medihoney and Hydrofera Blue to the wound bed. He has been using his surgical STEPEHEN, BUSSIE ([id number]) [id number] Page 6 of 10 shoe with peg assist. He has no issues or complaints today. We discussed doing the total contact cast and he was agreeable to having this placed at next clinic visit. 2/2; patient presents for follow-up. He has been using Vashe wet-to-dry dressings. Plan is for the total contact cast today. 2/5;  patient presents for follow-up. The cast was placed 3 days ago. He tolerated this well however had a lot of drainage. He currently denies signs of infection. 2/9; patient presents for follow-up. At last clinic visit we held off on replacing the cast due to drainage. He has been using Vashe wet-to-dry dressings and he is currently taking the oral antibiotics prescribed without issues. He declines a total contact cast today. We ran insurance verification for skin substitute and due to cost patient declines having this placed. 2/16; patient presents for follow-up. He has decided not to follow-up with podiatry. He continues to decline the total contact cast. He has been using Vashe wet- to-dry dressings. He has not been wearing his surgical shoe with peg assist. He currently denies signs of infection. 3/1; patient presents for follow-up. He has been doing Vashe wet-to-dry dressings. He has a surgical shoe with peg assist. We discussed potentially doing a skin substitute for which she has been approved for by insurance. He knows the out-of-pocket cost of this and would still like to proceed with having this placed today. He denies the current total contact cast. 3/8; patient presents for follow-up. He states that the skin substitute came off after a few days and he has been using Vashe wet-to-dry dressings. He has mild odor to the wound bed on exam. 3/15; patient presents for follow-up. He has been using Vashe wet-to-dry dressings and started his course of Augmentin. He reports improvement in wound healing. He has no issues or complaints today. 3/21; patient presents for follow-up. He continues to use Vashe wet-to-dry dressings. He has no issues or complaints today. 3/28; patient presents for follow-up. He has been using Vashe wet-to-dry dressings. He has no issues or complaints today. He has been using his peg assist with surgical shoe. 4/11; patient presents for follow-up. He has been using Vashe  wet-to-dry dressings. The periwound is macerated. He has been using his peg assist with surgical shoe. He has no issues or complaints today. 4/25; patient presents for follow-up. Has been using Vashe wet-to-dry dressings. He has been using zinc oxide to the periwound. For offloading he has been using his peg assist in surgical shoe. He has no issues or complaints today. 5/9; patient presents for follow-up. He has been using Vashe wet-to-dry dressings. He has no issues or complaints today. He has a surgical shoe with peg assist for offloading. Patient History Information obtained from Patient. Family History Heart Disease - Mother,Father, No family history of Cancer, Diabetes, Hereditary Spherocytosis, Hypertension, Kidney Disease, Lung Disease, Seizures, Stroke, Thyroid Problems, Tuberculosis. Social History Never smoker, Marital Status - Single, Alcohol Use - Rarely, Drug Use - No History, Caffeine Use - Rarely. Medical History Eyes Denies history of Cataracts, Glaucoma, Optic Neuritis Ear/Nose/Mouth/Throat Patient has history of Chronic sinus problems/congestion Denies history of Middle ear problems Hematologic/Lymphatic Denies history of Anemia,  Hemophilia, Human Immunodeficiency Virus, Lymphedema, Sickle Cell Disease Respiratory Denies history of Aspiration, Asthma, Chronic Obstructive Pulmonary Disease (COPD), Pneumothorax, Sleep Apnea, Tuberculosis Cardiovascular Patient has history of Congestive Heart Failure, Coronary Artery Disease, Hypertension, Myocardial Infarction - age 13 Denies history of Angina, Arrhythmia, Deep Vein Thrombosis, Hypotension, Peripheral Arterial Disease, Peripheral Venous Disease, Phlebitis, Vasculitis Gastrointestinal Denies history of Cirrhosis , Colitis, Crohnoos, Hepatitis A, Hepatitis B, Hepatitis C Endocrine Patient has history of Type II Diabetes Genitourinary Denies history of End Stage Renal Disease Immunological Denies history of Lupus  Erythematosus, Raynaudoos, Scleroderma Integumentary (Skin) Denies history of History of Burn Musculoskeletal Denies history of Gout, Rheumatoid Arthritis, Osteoarthritis, Osteomyelitis Neurologic Patient has history of Neuropathy Denies history of Dementia, Quadriplegia, Paraplegia, Seizure Disorder Oncologic Denies history of Received Chemotherapy, Received Radiation Psychiatric Denies history of Anorexia/bulimia, Confinement Anxiety Hospitalization/Surgery History - Heart Cath in 2018. Medical A Surgical History Notes nd Cardiovascular Ischemic Cardiomyopathy Genitourinary Renal Insufficiency Glenn, [name] ([id number]) [id number] Page 7 of 10 Objective Constitutional respirations regular, non-labored and within target range for patient.. Vitals Time Taken: 9:23 AM, Height: 78 in, Weight: 275 lbs, BMI: 31.8, Temperature: 98.2 F, Pulse: 87 bpm, Respiratory Rate: 20 breaths/min, Blood Pressure: 111/77 mmHg, Capillary Blood Glucose: 200 mg/dl. Cardiovascular 2+ dorsalis pedis/posterior tibialis pulses. Psychiatric pleasant and cooperative. General Notes: Left foot: T the lateral aspect, fifth met head there is an open wound with granulation tissue And slough accumulation. No signs of infection. o Integumentary (Hair, Skin) Wound #3 status is Open. Original cause of wound was Gradually Appeared. The date acquired was: 10/01/2022. The wound has been in treatment 14 weeks. The wound is located on the Left,Plantar Foot. The wound measures 1.1cm length x 1cm width x 0.2cm depth; 0.864cm^2 area and 0.173cm^3 volume. There is Fat Layer (Subcutaneous Tissue) exposed. There is no tunneling or undermining noted. There is a medium amount of serosanguineous drainage noted. The wound margin is thickened. There is large (67-100%) red granulation within the wound bed. There is a small (1-33%) amount of necrotic tissue within the wound bed including Adherent  Slough. The periwound skin appearance exhibited: Callus. The periwound skin appearance did not exhibit: Crepitus, Excoriation, Induration, Rash, Scarring, Dry/Scaly, Maceration, Atrophie Blanche, Cyanosis, Ecchymosis, Hemosiderin Staining, Mottled, Pallor, Rubor, Erythema. Periwound temperature was noted as No Abnormality. 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 I debrided nonviable tissue. No signs of infection. At this time I recommended switching the dressing to blast X and collagen T see o if we get further healing. Continue aggressive offloading. Patient is going to the beach for the next 2 weeks. We will see him when he returns. Procedures Wound #3 Pre-procedure diagnosis of Wound #3 is a Diabetic Wound/Ulcer of the Lower Extremity located on the Left,Plantar Foot .Severity of Tissue Pre Debridement is: Fat layer exposed. There was a Selective/Open Wound Non-Viable Tissue Debridement with a total area of 0.86 sq cm performed by Geralyn Corwin, DO. With the following instrument(s): Curette to remove Non-Viable tissue/material. Material removed includes Slough after achieving pain control using Lidocaine 4% T opical Solution. No specimens were taken. A time out was conducted at 10:01, prior to the start of the procedure. A Minimum amount of bleeding was controlled with Pressure. The procedure was tolerated well with a pain level of 0 throughout and a pain level of 0 following the procedure. Post  Debridement Measurements: 1.1cm length x 1cm width x 0.2cm depth; 0.173cm^3 volume. Character of Wound/Ulcer Post Debridement is improved. Severity of Tissue Post Debridement is: Fat layer exposed. Post procedure Diagnosis Wound #3: Same as Pre-Procedure General Notes: Scribed for Dr. Mikey Bussing by J.Scotton. Plan Follow-up  Appointments: Return Appointment in 2 weeks. - Dr. Mikey Bussing and [facility] [place] Room # 9 Cellular or Tissue Based Products: Cellular or Tissue Based Product Type: - apligraf and grafix 50% coinsurance 02/28/23-apply 1st Grafix 03/07/2023 HOLD THIS WEEK. Bathing/ Shower/ Hygiene: May shower with protection but do not get wound dressing(s) wet. Protect dressing(s) with water repellant cover (for example, large plastic bag) or a cast cover and may then take shower. Off-Loading: Other: - keep pressure off left lateral foot; use peg assist as insert in surgical shoe Additional Orders / Instructions: Other: - 05/08/23 If the Blastix causes Redness and/or pain please discontinue the usage of Blastix and resume Vashe wet to dry dressings Glenn, [name] ([id number]) [id number] Page 8 of 10 WOUND #3: - Foot Wound Laterality: Plantar, Left Cleanser: Wound Cleanser 1 x Per Day/15 Days Discharge Instructions: Cleanse the wound with wound cleanser prior to applying a clean dressing using gauze sponges, not tissue or cotton balls. Peri-Wound Care: Zinc Oxide Ointment 30g tube 1 x Per Day/15 Days Discharge Instructions: Apply Zinc Oxide to periwound with each dressing change Prim Dressing: Promogran Prisma Matrix, 4.34 (sq in) (silver collagen) 1 x Per Day/15 Days ary Discharge Instructions: Moisten collagen with saline and apply over Blastix in the wound bed Prim Dressing: 2x2 gauze 1 x Per Day/15 Days ary Discharge Instructions: sock gauze with vashe. Prim Dressing: Blastix 1 x Per Day/15 Days ary Discharge Instructions: apply to wound bed Secondary Dressing: ABD Pad, 5x9 1 x Per Day/15 Days Discharge Instructions: ABD pad donut. Secured With: [employer], 4.5x3.1 (in/yd) 1 x Per Day/15 Days Discharge Instructions: Secure with Kerlix as directed. Secured With: 39M Medipore H Soft Cloth Surgical T ape, 4 x 10 (in/yd) 1 x Per Day/15 Days Discharge Instructions: Secure with tape  as directed. 1. In office sharp debridement 2. Blast X and collagen 3. Aggressive offloadingoosurgical shoe with peg assist Electronic Signature(s) Signed: 05/08/2023 12:25:07 PM By: Geralyn Corwin DO Entered By: Geralyn Corwin on 05/08/2023 10:30:33 -------------------------------------------------------------------------------- HxROS Details Patient Name: Date of Service: [name] [place], [name] 05/08/2023 9:30 A M Medical Record Number: [id number] Patient Account Number: [id number] Date of Birth/Sex: Treating RN: 02/14/1971 (52 y.o. M) Primary Care Provider: [name] Other Clinician: Referring Provider: Treating Provider/Extender: [name] in Treatment: 14 Information Obtained From Patient Eyes Medical History: Negative for: Cataracts; Glaucoma; Optic Neuritis Ear/Nose/Mouth/Throat Medical History: Positive for: Chronic sinus problems/congestion Negative for: Middle ear problems Hematologic/Lymphatic Medical History: Negative for: Anemia; Hemophilia; Human Immunodeficiency Virus; Lymphedema; Sickle Cell Disease Respiratory Medical History: Negative for: Aspiration; Asthma; Chronic Obstructive Pulmonary Disease (COPD); Pneumothorax; Sleep Apnea; Tuberculosis Cardiovascular Medical History: Positive for: Congestive Heart Failure; Coronary Artery Disease; Hypertension; Myocardial Infarction - age 6 Negative for: Angina; Arrhythmia; Deep Vein Thrombosis; Hypotension; Peripheral Arterial Disease; Peripheral Venous Disease; Phlebitis; Vasculitis Past Medical History Notes: Ischemic Cardiomyopathy Gastrointestinal Medical History: Negative for: Cirrhosis ; Colitis; Crohns; Hepatitis A; Hepatitis B; Hepatitis C Endocrine Glenn, [name] ([id number]) [id number] Page 9 of 10 Medical History: Positive for: Type II Diabetes Time with diabetes: ten years Treated with: Insulin Blood sugar tested every day:  No Genitourinary Medical History: Negative for: End Stage Renal Disease Past Medical History Notes: Renal Insufficiency Immunological Medical  History: Negative for: Lupus Erythematosus; Raynauds; Scleroderma Integumentary (Skin) Medical History: Negative for: History of Burn Musculoskeletal Medical History: Negative for: Gout; Rheumatoid Arthritis; Osteoarthritis; Osteomyelitis Neurologic Medical History: Positive for: Neuropathy Negative for: Dementia; Quadriplegia; Paraplegia; Seizure Disorder Oncologic Medical History: Negative for: Received Chemotherapy; Received Radiation Psychiatric Medical History: Negative for: Anorexia/bulimia; Confinement Anxiety HBO Extended History Items Ear/Nose/Mouth/Throat: Chronic sinus problems/congestion Immunizations Pneumococcal Vaccine: Received Pneumococcal Vaccination: No Implantable Devices None Hospitalization / Surgery History Type of Hospitalization/Surgery Heart Cath in 2018 Family and Social History Cancer: No; Diabetes: No; Heart Disease: Yes - Mother,Father; Hereditary Spherocytosis: No; Hypertension: No; Kidney Disease: No; Lung Disease: No; Seizures: No; Stroke: No; Thyroid Problems: No; Tuberculosis: No; Never smoker; Marital Status - Single; Alcohol Use: Rarely; Drug Use: No History; Caffeine Use: Rarely; Financial Concerns: No; Food, Clothing or Shelter Needs: No; Support System Lacking: No; Transportation Concerns: No Electronic Signature(s) Signed: 05/08/2023 12:25:07 PM By: Geralyn Corwin DO Entered By: Geralyn Corwin on 05/08/2023 10:27:30 -------------------------------------------------------------------------------- SuperBill Details Patient Name: Date of Service: [name], [name] 05/08/2023 [name] ([id number]) [id number] Page 10 of 10 Medical Record Number: [id number] Patient Account Number: [id number] Date of Birth/Sex: Treating RN: 23-Apr-1971 (52 y.o. M) Primary  Care Provider: [name] Other Clinician: Referring Provider: Treating Provider/Extender: [name] in Treatment: 14 Diagnosis Coding ICD-10 Codes Code Description [phone] 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.621 Type 2 diabetes mellitus with foot ulcer Z89.422 Acquired absence of other left toe(s) Facility Procedures : CPT4 Code: [id number] Description: 97597 - DEBRIDE WOUND 1ST 20 SQ CM OR < ICD-10 Diagnosis Description L97.522 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.621 Type 2 diabetes mellitus with foot ulcer Modifier: Quantity: 1 Physician Procedures : CPT4 Code Description Modifier [id number] - WC PHYS DEBR WO ANESTH 20 SQ CM ICD-10 Diagnosis Description L97.522 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.621 Type 2 diabetes mellitus with foot ulcer Quantity: 1 Electronic Signature(s) Signed: 05/08/2023 12:25:07 PM By: Geralyn Corwin DO Entered By: Geralyn Corwin on 05/08/2023 10:30:39

## 2023-05-12 ENCOUNTER — Other Ambulatory Visit: Payer: Self-pay

## 2023-05-16 DIAGNOSIS — G4733 Obstructive sleep apnea (adult) (pediatric): Secondary | ICD-10-CM | POA: Diagnosis not present

## 2023-05-19 ENCOUNTER — Other Ambulatory Visit: Payer: Self-pay

## 2023-05-22 ENCOUNTER — Other Ambulatory Visit: Payer: Self-pay

## 2023-05-22 ENCOUNTER — Encounter (HOSPITAL_BASED_OUTPATIENT_CLINIC_OR_DEPARTMENT_OTHER): Payer: 59 | Admitting: Internal Medicine

## 2023-05-22 DIAGNOSIS — Z794 Long term (current) use of insulin: Secondary | ICD-10-CM | POA: Diagnosis not present

## 2023-05-22 DIAGNOSIS — E11621 Type 2 diabetes mellitus with foot ulcer: Secondary | ICD-10-CM

## 2023-05-22 DIAGNOSIS — L97522 Non-pressure chronic ulcer of other part of left foot with fat layer exposed: Secondary | ICD-10-CM

## 2023-05-22 DIAGNOSIS — E1122 Type 2 diabetes mellitus with diabetic chronic kidney disease: Secondary | ICD-10-CM | POA: Diagnosis not present

## 2023-05-22 DIAGNOSIS — Z955 Presence of coronary angioplasty implant and graft: Secondary | ICD-10-CM | POA: Diagnosis not present

## 2023-05-22 DIAGNOSIS — N189 Chronic kidney disease, unspecified: Secondary | ICD-10-CM | POA: Diagnosis not present

## 2023-05-22 DIAGNOSIS — I130 Hypertensive heart and chronic kidney disease with heart failure and stage 1 through stage 4 chronic kidney disease, or unspecified chronic kidney disease: Secondary | ICD-10-CM | POA: Diagnosis not present

## 2023-05-22 DIAGNOSIS — I2510 Atherosclerotic heart disease of native coronary artery without angina pectoris: Secondary | ICD-10-CM | POA: Diagnosis not present

## 2023-05-22 DIAGNOSIS — Z8249 Family history of ischemic heart disease and other diseases of the circulatory system: Secondary | ICD-10-CM | POA: Diagnosis not present

## 2023-05-22 DIAGNOSIS — Z89422 Acquired absence of other left toe(s): Secondary | ICD-10-CM | POA: Diagnosis not present

## 2023-05-22 DIAGNOSIS — I5042 Chronic combined systolic (congestive) and diastolic (congestive) heart failure: Secondary | ICD-10-CM | POA: Diagnosis not present

## 2023-05-22 DIAGNOSIS — E785 Hyperlipidemia, unspecified: Secondary | ICD-10-CM | POA: Diagnosis not present

## 2023-05-28 ENCOUNTER — Other Ambulatory Visit: Payer: Self-pay

## 2023-05-30 ENCOUNTER — Encounter (HOSPITAL_BASED_OUTPATIENT_CLINIC_OR_DEPARTMENT_OTHER): Payer: 59 | Admitting: Internal Medicine

## 2023-05-30 DIAGNOSIS — Z89422 Acquired absence of other left toe(s): Secondary | ICD-10-CM | POA: Diagnosis not present

## 2023-05-30 DIAGNOSIS — E11621 Type 2 diabetes mellitus with foot ulcer: Secondary | ICD-10-CM | POA: Diagnosis not present

## 2023-05-30 DIAGNOSIS — Z794 Long term (current) use of insulin: Secondary | ICD-10-CM | POA: Diagnosis not present

## 2023-05-30 DIAGNOSIS — I5042 Chronic combined systolic (congestive) and diastolic (congestive) heart failure: Secondary | ICD-10-CM | POA: Diagnosis not present

## 2023-05-30 DIAGNOSIS — L97522 Non-pressure chronic ulcer of other part of left foot with fat layer exposed: Secondary | ICD-10-CM | POA: Diagnosis not present

## 2023-05-30 DIAGNOSIS — Z8249 Family history of ischemic heart disease and other diseases of the circulatory system: Secondary | ICD-10-CM | POA: Diagnosis not present

## 2023-05-30 DIAGNOSIS — Z955 Presence of coronary angioplasty implant and graft: Secondary | ICD-10-CM | POA: Diagnosis not present

## 2023-05-30 DIAGNOSIS — E1122 Type 2 diabetes mellitus with diabetic chronic kidney disease: Secondary | ICD-10-CM | POA: Diagnosis not present

## 2023-05-30 DIAGNOSIS — N189 Chronic kidney disease, unspecified: Secondary | ICD-10-CM | POA: Diagnosis not present

## 2023-05-30 DIAGNOSIS — E785 Hyperlipidemia, unspecified: Secondary | ICD-10-CM | POA: Diagnosis not present

## 2023-05-30 DIAGNOSIS — I130 Hypertensive heart and chronic kidney disease with heart failure and stage 1 through stage 4 chronic kidney disease, or unspecified chronic kidney disease: Secondary | ICD-10-CM | POA: Diagnosis not present

## 2023-05-30 DIAGNOSIS — I2510 Atherosclerotic heart disease of native coronary artery without angina pectoris: Secondary | ICD-10-CM | POA: Diagnosis not present

## 2023-06-12 ENCOUNTER — Encounter (HOSPITAL_BASED_OUTPATIENT_CLINIC_OR_DEPARTMENT_OTHER): Payer: 59 | Attending: Internal Medicine | Admitting: Internal Medicine

## 2023-06-12 DIAGNOSIS — I5042 Chronic combined systolic (congestive) and diastolic (congestive) heart failure: Secondary | ICD-10-CM | POA: Insufficient documentation

## 2023-06-12 DIAGNOSIS — I2510 Atherosclerotic heart disease of native coronary artery without angina pectoris: Secondary | ICD-10-CM | POA: Diagnosis not present

## 2023-06-12 DIAGNOSIS — Z955 Presence of coronary angioplasty implant and graft: Secondary | ICD-10-CM | POA: Diagnosis not present

## 2023-06-12 DIAGNOSIS — Z794 Long term (current) use of insulin: Secondary | ICD-10-CM | POA: Diagnosis not present

## 2023-06-12 DIAGNOSIS — E1140 Type 2 diabetes mellitus with diabetic neuropathy, unspecified: Secondary | ICD-10-CM | POA: Insufficient documentation

## 2023-06-12 DIAGNOSIS — E11621 Type 2 diabetes mellitus with foot ulcer: Secondary | ICD-10-CM | POA: Diagnosis not present

## 2023-06-12 DIAGNOSIS — E1122 Type 2 diabetes mellitus with diabetic chronic kidney disease: Secondary | ICD-10-CM | POA: Insufficient documentation

## 2023-06-12 DIAGNOSIS — I130 Hypertensive heart and chronic kidney disease with heart failure and stage 1 through stage 4 chronic kidney disease, or unspecified chronic kidney disease: Secondary | ICD-10-CM | POA: Insufficient documentation

## 2023-06-12 DIAGNOSIS — N189 Chronic kidney disease, unspecified: Secondary | ICD-10-CM | POA: Diagnosis not present

## 2023-06-12 DIAGNOSIS — E785 Hyperlipidemia, unspecified: Secondary | ICD-10-CM | POA: Diagnosis not present

## 2023-06-12 DIAGNOSIS — L97522 Non-pressure chronic ulcer of other part of left foot with fat layer exposed: Secondary | ICD-10-CM | POA: Diagnosis not present

## 2023-06-16 NOTE — Progress Notes (Signed)
TACUMA, LAUERMAN ([id number]) [id number] Page 1 of 11 Visit Report for 06/12/2023 Chief Complaint Document Details Patient Name: Date of Service: [name] [place], [name] RO LD 06/12/2023 2:00 PM Medical Record Number: [id number] Patient Account Number: [id number] Date of Birth/Sex: Treating RN: Apr 11, 1971 (52 y.o. M) Primary Care Provider: [name] Other Clinician: Referring Provider: Treating Provider/Extender: [name] in Treatment: 46 Information Obtained from: Patient Chief Complaint 01/15/2023; left foot wound Electronic Signature(s) Signed: 06/16/2023 12:26:35 PM By: Geralyn Corwin DO Entered By: Geralyn Corwin on 06/12/2023 14:22:08 -------------------------------------------------------------------------------- Debridement Details Patient Name: Date of Service: [name] [place], [name] 06/12/2023 2:00 PM Medical Record Number: [id number] Patient Account Number: [id number] Date of Birth/Sex: Treating RN: 1971/05/01 (52 y.o. [name] Primary Care Provider: [name] Other Clinician: Referring Provider: Treating Provider/Extender: [name] in Treatment: 19 Debridement Performed for Assessment: Wound #3 Left,Plantar Foot Performed By: Physician Geralyn Corwin, DO Debridement Type: Debridement Severity of Tissue Pre Debridement: Fat layer exposed Level of Consciousness (Pre-procedure): Awake and Alert Pre-procedure Verification/Time Out Yes - 14:05 Taken: Start Time: 14:08 Pain Control: Lidocaine 4% Topical Solution Percent of Wound Bed Debrided: 150% T Area Debrided (cm): otal 3.53 Tissue and other material debrided: Non-Viable, Callus, Slough, Skin: Dermis , Skin: Epidermis, Slough Level: Skin/Epidermis Debridement Description: Selective/Open Wound Instrument: Curette Bleeding: Minimum Hemostasis Achieved: Pressure Response to Treatment: Procedure was tolerated  well Level of Consciousness (Post- Awake and Alert procedure): Post Debridement Measurements of Total Wound Length: (cm) 2 Width: (cm) 1.5 Depth: (cm) 0.2 Volume: (cm) 0.471 Character of Wound/Ulcer Post Debridement: Improved Severity of Tissue Post Debridement: Fat layer exposed Post Procedure Diagnosis [name], [name] ([id number]) [id number] Page 2 of 11 Same as Pre-procedure Notes Scribed for Dr Mikey Bussing by Redmond Pulling, RN Electronic Signature(s) Signed: 06/12/2023 4:11:58 PM By: Redmond Pulling RN, BSN Signed: 06/16/2023 12:26:35 PM By: Geralyn Corwin DO Entered By: Redmond Pulling on 06/12/2023 14:10:03 -------------------------------------------------------------------------------- HPI Details Patient Name: Date of Service: [name] [place], [name] 06/12/2023 2:00 PM Medical Record Number: [id number] Patient Account Number: [id number] Date of Birth/Sex: Treating RN: [birth year] (52 y.o. M) Primary Care Provider: [name] Other Clinician: Referring Provider: Treating Provider/Extender: [name] in Treatment: 19 History of Present Illness HPI Description: ADMISSION 01/11/2020 This is a 52 year old man with uncontrolled type 2 diabetes with a recent hemoglobin A1c earlier this year of 13.4. He is on insulin and glipizide. He does not take his blood sugars at home. He does have a follow-up with primary care later this month I believe on January 27. He tells [place] that roughly a month ago he was walking with a shoe with a hole in his foot. He took the shoe off and there was an open wound at roughly the left fourth met head. This has significant undermining and raised edges. He has not noticed any purulence he does not feel unwell. More recently he was taking skin off the bottom of his foot and has a superficial area on the left fifth met head. He has not been offloading this. The patient was in the ER on 12/20. They gave him  Bactroban which she has been using on the wound and 10 days worth of doxycycline. No x-rays were done. He has not had vascular studies. He is also been using hydrogen peroxide. Past medical history type 2 diabetes uncontrolled, chronic systolic heart failure, coronary artery disease with a history of congestive heart failure with stents. Hypertension hyperlipidemia and chronic  renal insufficiency ABI in this clinic was 1.14 on the left. 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 He is on his feet a lot. He is uncertain whether he would be able to work if we put him on some form of restriction 1/19; he is generally doing quite well. Using silver alginate on the wounds. Things actually look better. He has a forefoot offloading boot which she seems to be compliant about. He has support at work to stay off his foot is much as possible which is gratifying. Culture I did last week showed a few Enterococcus faecalis. I am going to put him on Augmentin. I talked about ordering an x-ray in my note last week but that does not seem to have happened. We will review reorder the x-ray this week. 1/26; x-ray reordered last week was negative for osteomyelitis. We are using silver alginate on the wound on the third and fifth met heads on the left. He is using a Darco forefoot off loader 2/2; the area on the fifth met head is closed. Third met head is still open with tunneling depth and thick callus. 2/9; the area on the fifth met head remains closed however the third met head again has a small open area on presentation with tunneling in depth and surrounded by thick callus. This looks like a pressure issue. We have been using silver alginate 2/16; the area of the fifth metatarsal head remains closed however the area over the third metatarsal head again is a small open area but with some depth. I do not think this is changed much since last week. He is using Hydrofera Blue with forefoot off loader. 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 Fortunately the wound does not look infected. I changed him to endoform today 2/26; the area of the fifth metatarsal head remains closed. The area of the third metatarsal head has an even smaller opening this time. I used endoform on this this looks improved. He is offloading this is much as he can and a forefoot off loader on the left. He cannot have a total contact cast because of work responsibilities 3/5; the area on the fifth metatarsal head remains closed the area on the third metatarsal head is also closed on the left foot. 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 He has been following for podiatry for several months for this issue. He is currently using wet-to-dry saline dressings. He currently denies signs of infection. Progresse note from 1/17; Patient presents with a 2644-month history of nonhealing ulcer to the left foot secondary to diabetes and inability to offload well the area. He has had multiple debridements in the past to his feet bilaterally. He has had resection of the fourth left met head in the fifth toe Secondary to osteomyelitis. We discussed the importance of glycemic control for wound healing. Due to his blood glucose levels being elevated he is at high risk for infection and thus further amputation. He expressed understanding. He states he is supposed to be referred to an endocrinologist at [facility] however the referral fell through. Unclear  what happened. Offered a referral to endocrinology at [facility]. Patient was  agreeable.Furthermore we discussed the importance of aggressive offloading for his wound healing. This will be the most difficult part of the treatment plan for the patient to do. We discussed a total contact cast however he has declined that at this time. 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 He is currently using a surgical shoe with a peg assist. It does not fit well so we will give him a new one today. I do not think this is enough offloading. If he is not able to offload this area he will likely end up with a BKA. He is well aware of this. For now I recommended Medihoney and Hydrofera Blue for dressing changes. He will follow-up in our [place] office Since this is closer for him. [name], [name] ([id number]) [id number] Page 3 of 11 1/26; patient presents for follow-up. Last clinic visit I had seen him in [place] and we transferred him to [place] since this is a closer location for him. Progress note above from that visit. Over the past week He has been using Medihoney and Hydrofera Blue to the wound bed. He has been using his surgical shoe with peg assist. He has no issues or complaints today. We discussed doing the total contact cast and he was agreeable to having this placed at next clinic visit. 2/2; patient presents for follow-up. He has been using Vashe wet-to-dry dressings. Plan is for the total contact cast today. 2/5; patient presents for follow-up. The cast was placed 3 days ago. He tolerated this well however had a lot of drainage. He currently denies signs of infection. 2/9; patient presents for follow-up. At last clinic visit we held off on replacing the cast due to drainage. He has been using Vashe wet-to-dry dressings and he is currently taking the oral antibiotics prescribed without issues. He declines a total contact cast today. We ran insurance verification for skin  substitute and due to cost patient declines having this placed. 2/16; patient presents for follow-up. He has decided not to follow-up with podiatry. He continues to decline the total contact cast. He has been using Vashe wet- to-dry dressings. He has not been wearing his surgical shoe with peg assist. He currently denies signs of infection. 3/1; patient presents for follow-up. He has been doing Vashe wet-to-dry dressings. He has a surgical shoe with peg assist. We discussed potentially doing a skin substitute for which she has been approved for by insurance. He knows the out-of-pocket cost of this and would still like to proceed with having this placed today. He denies the current total contact cast. 3/8; patient presents for follow-up. He states that the skin substitute came off after a few days and he has been using Vashe wet-to-dry dressings. He has mild odor to the wound bed on exam. 3/15; patient presents for follow-up. He has been using Vashe wet-to-dry dressings and started his course of Augmentin. He reports improvement in wound healing. He has no issues or complaints today. 3/21; patient presents for follow-up. He continues to use Vashe wet-to-dry dressings. He has no issues or complaints today. 3/28; patient presents for follow-up. He has been using Vashe wet-to-dry dressings. He has no issues or complaints today. He has been using his peg assist with surgical shoe. 4/11; patient presents for follow-up. He has been using Vashe wet-to-dry dressings. The periwound is  macerated. He has been using his peg assist with surgical shoe. He has no issues or complaints today. 4/25; patient presents for follow-up. Has been using Vashe wet-to-dry dressings. He has been using zinc oxide to the periwound. For offloading he has been using his peg assist in surgical shoe. He has no issues or complaints today. 5/9; patient presents for follow-up. He has been using Vashe wet-to-dry dressings. He has no  issues or complaints today. He has a surgical shoe with peg assist for offloading. 5/23; patient presents for follow up. He has been using Vashe to clean the wound bed and collagen and blast X with dressing changes. [place] the [place] 2 [place]. Wound is bigger. 5/31; patient presents for follow-up. He has been using Vashe wet-to-dry dressings. Wound is stable but with healthier granulation tissue. Patient would like to hold off on the total contact cast and try a knee scooter for offloading. 6/13; patient presents for follow-up. He has been using Vashe wet-to-dry dressings. He has been using a knee scooter for offloading however states he felt unstable on this and decided to buy an electric scooter. This arrived in the mail today. He would like to hold off on the left total contact cast. Wound is slightly smaller today. Electronic Signature(s) Signed: 06/16/2023 12:26:35 PM By: Geralyn Corwin DO Entered By: Geralyn Corwin on 06/12/2023 14:22:53 -------------------------------------------------------------------------------- Physical Exam Details Patient Name: Date of Service: [name] [place], [name] 06/12/2023 2:00 PM Medical Record Number: [id number] Patient Account Number: [id number] Date of Birth/Sex: Treating RN: [birth year] (52 y.o. M) Primary Care Provider: [name] Other Clinician: Referring Provider: Treating Provider/Extender: [name] in Treatment: 19 Constitutional respirations regular, non-labored and within target range for patient.. Cardiovascular 2+ dorsalis pedis/posterior tibialis pulses. Psychiatric pleasant and cooperative. Notes Left foot: T the lateral aspect, fifth met head there is an open wound with granulation tissue, callus And slough. No signs of infection. [name], [name] ([id number]) [id number] Page 4 of 11 Electronic Signature(s) Signed: 06/16/2023 12:26:35 PM By: Geralyn Corwin  DO Entered By: Geralyn Corwin on 06/12/2023 14:23:59 -------------------------------------------------------------------------------- Physician Orders Details Patient Name: Date of Service: [name] [place], [name] 06/12/2023 2:00 PM Medical Record Number: [id number] Patient Account Number: [id number] Date of Birth/Sex: Treating RN: 08-13-1971 (52 y.o. [name] Primary Care Provider: [name] Other Clinician: Referring Provider: Treating Provider/Extender: [name] in Treatment: 78 Verbal / Phone Orders: No Diagnosis Coding Follow-up Appointments ppointment in 2 weeks. - Dr.Donato Studley Room 9 Thursday 06/26/23 @ 11:00 Return A Cellular or Tissue Based Products Cellular or Tissue Based Product Type: - apligraf and grafix 50% coinsurance 02/28/23-apply 1st Grafix 03/07/2023 HOLD THIS WEEK. Bathing/ Shower/ Hygiene May shower with protection but do not get wound dressing(s) wet. Protect dressing(s) with water repellant cover (for example, large plastic bag) or a cast cover and may then take shower. Off-Loading Other: - keep pressure off left lateral foot; use peg assist as insert in surgical shoe Wound Treatment Wound #3 - Foot Wound Laterality: Plantar, Left Cleanser: Soap and Water 1 x Per Day/15 Days Discharge Instructions: May shower and wash wound with dial antibacterial soap and water prior to dressing change. Cleanser: Vashe 5.8 (oz) 1 x Per Day/15 Days Discharge Instructions: Cleanse the wound with Vashe prior to applying a clean dressing using gauze sponges, not tissue or cotton balls. Peri-Wound Care: Zinc Oxide Ointment 30g tube 1 x Per Day/15 Days Discharge Instructions: Apply Zinc Oxide to periwound  with each dressing change Prim Dressing: 2x2 gauze 1 x Per Day/15 Days ary Discharge Instructions: sock gauze with vashe. Prim Dressing: vashe 1 x Per Day/15 Days ary Discharge Instructions: damp gauze to wound bed Secondary Dressing:  ABD Pad, 5x9 1 x Per Day/15 Days Discharge Instructions: ABD pad donut. Secured With: [employer], 4.5x3.1 (in/yd) 1 x Per Day/15 Days Discharge Instructions: Secure with Kerlix as directed. Secured With: 82M Medipore H Soft Cloth Surgical T ape, 4 x 10 (in/yd) 1 x Per Day/15 Days Discharge Instructions: Secure with tape as directed. Patient Medications llergies: Jardiance A Notifications Medication Indication Start End 06/12/2023 lidocaine DOSE topical 4 % cream - cream topical once daily RECO, ISEMINGER ([id number]) [id number] Page 5 of 11 Electronic Signature(s) Signed: 06/16/2023 12:26:35 PM By: Geralyn Corwin DO Entered By: Geralyn Corwin on 06/12/2023 14:24:07 -------------------------------------------------------------------------------- Problem List Details Patient Name: Date of Service: [name] [place], [name] 06/12/2023 2:00 PM Medical Record Number: [id number] Patient Account Number: [id number] Date of Birth/Sex: Treating RN: 11-Dec-1971 (52 y.o. M) Primary Care Provider: [name] Other Clinician: Referring Provider: Treating Provider/Extender: [name] in Treatment: 19 Active Problems ICD-10 Encounter Code Description Active Date MDM Diagnosis [phone] Non-pressure chronic ulcer of other part of left foot with fat layer exposed 01/24/2023 No Yes E11.621 Type 2 diabetes mellitus with foot ulcer 01/24/2023 No Yes Z89.422 Acquired absence of other left toe(s) 01/24/2023 No Yes Inactive Problems Resolved Problems Electronic Signature(s) Signed: 06/16/2023 12:26:35 PM By: Geralyn Corwin DO Entered By: Geralyn Corwin on 06/12/2023 14:21:28 -------------------------------------------------------------------------------- Progress Note Details Patient Name: Date of Service: [name] [place], [name] 06/12/2023 2:00 PM Medical Record Number: [id number] Patient Account Number: [id number] Date of Birth/Sex:  Treating RN: 19-Aug-1971 (52 y.o. M) Primary Care Provider: [name] Other Clinician: Referring Provider: Treating Provider/Extender: [name] in Treatment: 41 Subjective Chief Complaint Information obtained from Patient 01/15/2023; left foot wound History of Present Illness (HPI) ADMISSION 01/11/2020 [name], [name] ([id number]) [id number] Page 6 of 11 This is a 52 year old man with uncontrolled type 2 diabetes with a recent hemoglobin A1c earlier this year of 13.4. He is on insulin and glipizide. He does not take his blood sugars at home. He does have a follow-up with primary care later this month I believe on January 27. He tells [place] that roughly a month ago he was walking with a shoe with a hole in his foot. He took the shoe off and there was an open wound at roughly the left fourth met head. This has significant undermining and raised edges. He has not noticed any purulence he does not feel unwell. More recently he was taking skin off the bottom of his foot and has a superficial area on the left fifth met head. He has not been offloading this. The patient was in the ER on 12/20. They gave him Bactroban which she has been using on the wound and 10 days worth of doxycycline. No x-rays were done. He has not had vascular studies. He is also been using hydrogen peroxide. Past medical history type 2 diabetes uncontrolled, chronic systolic heart failure, coronary artery disease with a history of congestive heart failure with stents. Hypertension hyperlipidemia and chronic renal insufficiency ABI in this clinic was 1.14 on the left. 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 He is uncertain whether he would be able to work if we put him on some form of restriction 1/19; he  is generally doing quite well. Using silver alginate on the wounds. Things actually look better. He has a forefoot offloading boot  which she seems to be compliant about. He has support at work to stay off his foot is much as possible which is gratifying. Culture I did last week showed a few Enterococcus faecalis. I am going to put him on Augmentin. I talked about ordering an x-ray in my note last week but that does not seem to have happened. We will review reorder the x-ray this week. 1/26; x-ray reordered last week was negative for osteomyelitis. We are using silver alginate on the wound on the third and fifth met heads on the left. He is using a Darco forefoot off loader 2/2; the area on the fifth met head is closed. Third met head is still open with tunneling depth and thick callus. 2/9; the area on the fifth met head remains closed however the third met head again has a small open area on presentation with tunneling in depth and surrounded by thick callus. This looks like a pressure issue. We have been using silver alginate 2/16; the area of the fifth metatarsal head remains closed however the area over the third metatarsal head again is a small open area but with some depth. I do not think this is changed much since last week. He is using Hydrofera Blue with forefoot off loader. He is not able to use a total contact cast on the left leg because he needs his left leg at work [employer]]. Fortunately the wound does not look infected. I changed him to endoform today 2/26; the area of the fifth metatarsal head remains closed. The area of the third metatarsal head has an even smaller opening this time. I used endoform on this this looks improved. He is offloading this is much as he can and a forefoot off loader on the left. He cannot have a total contact cast because of work responsibilities 3/5; the area on the fifth metatarsal head remains closed the area on the third metatarsal head is also closed on the left foot. 01/15/2023 [name] is a 53 year old male with a past medical history of uncontrolled  insulin-dependent type 2 diabetes with self-reported last hemoglobin A1c of 12, previous amputations to his feet bilaterally secondary to osteomyelitis, CAD and chronic combined systolic and diastolic heart failure that presents to the clinic for a 15-month history of nonhealing ulcer to the left lateral foot. He has been following for podiatry for several months for this issue. He is currently using wet-to-dry saline dressings. He currently denies signs of infection. Progresse note from 1/17; Patient presents with a 34-month history of nonhealing ulcer to the left foot secondary to diabetes and inability to offload well the area. He has had multiple debridements in the past to his feet bilaterally. He has had resection of the fourth left met head in the fifth toe Secondary to osteomyelitis. We discussed the importance of glycemic control for wound healing. Due to his blood glucose levels being elevated he is at high risk for infection and thus further amputation. He expressed understanding. He states he is supposed to be referred to an endocrinologist at [facility] however the referral fell through. Unclear what happened. Offered a referral to endocrinology at [facility]. Patient was agreeable.Furthermore we discussed the importance of aggressive offloading for his wound healing. This will be the most difficult part of the treatment plan for the patient to do. We discussed a total contact cast  however he has declined that at this time. 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 He is currently using a surgical shoe with a peg assist. It does not fit well so we will give him a new one today. I do not think this is enough offloading. If he is not able to offload this area he will likely end up with a BKA. He is well aware of this. For now I recommended Medihoney and Hydrofera Blue for dressing changes. He will follow-up in our [place] office  Since this is closer for him. 1/26; patient presents for follow-up. Last clinic visit I had seen him in [place] and we transferred him to [place] since this is a closer location for him. Progress note above from that visit. Over the past week He has been using Medihoney and Hydrofera Blue to the wound bed. He has been using his surgical shoe with peg assist. He has no issues or complaints today. We discussed doing the total contact cast and he was agreeable to having this placed at next clinic visit. 2/2; patient presents for follow-up. He has been using Vashe wet-to-dry dressings. Plan is for the total contact cast today. 2/5; patient presents for follow-up. The cast was placed 3 days ago. He tolerated this well however had a lot of drainage. He currently denies signs of infection. 2/9; patient presents for follow-up. At last clinic visit we held off on replacing the cast due to drainage. He has been using Vashe wet-to-dry dressings and he is currently taking the oral antibiotics prescribed without issues. He declines a total contact cast today. We ran insurance verification for skin substitute and due to cost patient declines having this placed. 2/16; patient presents for follow-up. He has decided not to follow-up with podiatry. He continues to decline the total contact cast. He has been using Vashe wet- to-dry dressings. He has not been wearing his surgical shoe with peg assist. He currently denies signs of infection. 3/1; patient presents for follow-up. He has been doing Vashe wet-to-dry dressings. He has a surgical shoe with peg assist. We discussed potentially doing a skin substitute for which she has been approved for by insurance. He knows the out-of-pocket cost of this and would still like to proceed with having this placed today. He denies the current total contact cast. 3/8; patient presents for follow-up. He states that the skin substitute came off after a few days and he has been  using Vashe wet-to-dry dressings. He has mild odor to the wound bed on exam. 3/15; patient presents for follow-up. He has been using Vashe wet-to-dry dressings and started his course of Augmentin. He reports improvement in wound healing. He has no issues or complaints today. 3/21; patient presents for follow-up. He continues to use Vashe wet-to-dry dressings. He has no issues or complaints today. 3/28; patient presents for follow-up. He has been using Vashe wet-to-dry dressings. He has no issues or complaints today. He has been using his peg assist with surgical shoe. 4/11; patient presents for follow-up. He has been using Vashe wet-to-dry dressings. The periwound is macerated. He has been using his peg assist with surgical shoe. He has no issues or complaints today. 4/25; patient presents for follow-up. Has been using Vashe wet-to-dry dressings. He has been using zinc oxide to the periwound. For offloading he has been using his peg assist in surgical shoe. He has no  issues or complaints today. 5/9; patient presents for follow-up. He has been using Vashe wet-to-dry dressings. He has no issues or complaints today. He has a surgical shoe with peg assist for offloading. [name], [name] ([id number]) [id number] Page 7 of 11 5/23; patient presents for follow up. He has been using Vashe to clean the wound bed and collagen and blast X with dressing changes. [place] went to the beach [place] 2 weeks. Wound is bigger. 5/31; patient presents for follow-up. He has been using Vashe wet-to-dry dressings. Wound is stable but with healthier granulation tissue. Patient would like to hold off on the total contact cast and try a knee scooter for offloading. 6/13; patient presents for follow-up. He has been using Vashe wet-to-dry dressings. He has been using a knee scooter for offloading however states he felt unstable on this and decided to buy an electric scooter. This arrived in the mail today.  He would like to hold off on the left total contact cast. Wound is slightly smaller today. Patient History Information obtained from Patient. Family History Heart Disease - Mother,Father, No family history of Cancer, Diabetes, Hereditary Spherocytosis, Hypertension, Kidney Disease, Lung Disease, Seizures, Stroke, Thyroid Problems, Tuberculosis. Social History Never smoker, Marital Status - Single, Alcohol Use - Rarely, Drug Use - No History, Caffeine Use - Rarely. 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 43 Denies history of Angina, Arrhythmia, Deep Vein Thrombosis, Hypotension, Peripheral Arterial Disease, Peripheral Venous Disease, Phlebitis, Vasculitis Gastrointestinal Denies history of Cirrhosis , Colitis, Crohns, Hepatitis A, Hepatitis B, Hepatitis C Endocrine Patient has history of Type II Diabetes Genitourinary Denies history of End Stage Renal Disease Immunological Denies history of Lupus Erythematosus, Raynauds, Scleroderma Integumentary (Skin) Denies history of History of Burn Musculoskeletal Denies history of Gout, Rheumatoid Arthritis, Osteoarthritis, Osteomyelitis Neurologic Patient has history of Neuropathy Denies history of Dementia, Quadriplegia, Paraplegia, Seizure Disorder Oncologic Denies history of Received Chemotherapy, Received Radiation Psychiatric Denies history of Anorexia/bulimia, Confinement  Anxiety Hospitalization/Surgery History - Heart Cath in 2018. Medical A Surgical History Notes nd Cardiovascular Ischemic Cardiomyopathy Genitourinary Renal Insufficiency Objective Constitutional respirations regular, non-labored and within target range for patient.. Vitals Time Taken: 1:50 PM, Height: 78 in, Weight: 275 lbs, BMI: 31.8, Temperature: 98.3 F, Pulse: 92 bpm, Respiratory Rate: 18 breaths/min, Blood Pressure: 112/78 mmHg. Cardiovascular 2+ dorsalis pedis/posterior tibialis pulses. Psychiatric pleasant and cooperative. General Notes: Left foot: T the lateral aspect, fifth met head there is an open wound with granulation tissue, callus And slough. No signs of infection. [name], [name] ([id number]) [id number] Page 8 of 11 Integumentary (Hair, Skin) Wound #3 status is Open. Original cause of wound was Gradually Appeared. The date acquired was: 10/01/2022. The wound has been in treatment 19 weeks. The wound is located on the Left,Plantar Foot. The wound measures 2cm length x 1.5cm width x 0.2cm depth; 2.356cm^2 area and 0.471cm^3 volume. There is Fat Layer (Subcutaneous Tissue) exposed. There is no tunneling or undermining noted. There is a medium amount of serosanguineous drainage noted. The wound margin is thickened. There is large (67-100%) red granulation within the wound bed. There is a small (1-33%) amount of necrotic tissue within the wound bed  including Adherent Slough. The periwound skin appearance exhibited: Callus. The periwound skin appearance did not exhibit: Crepitus, Excoriation, Induration, Rash, Scarring, Dry/Scaly, Maceration, Atrophie Blanche, Cyanosis, Ecchymosis, Hemosiderin Staining, Mottled, Pallor, Rubor, Erythema. Periwound temperature was noted as No Abnormality. 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 There is healthier granulation tissue present today. No signs of infection. He would like to try using his electric scooter for offloading. Can continue Vashe wet-to-dry dressings. Follow-up in 2 weeks. If minimal improvement I recommended restarting the cast. Procedures Wound #3 Pre-procedure diagnosis of Wound #3 is a Diabetic Wound/Ulcer of the Lower Extremity located on the Left,Plantar Foot .Severity of Tissue Pre Debridement is: Fat layer exposed. There was a Selective/Open Wound Skin/Epidermis Debridement with a total area of 3.53 sq cm performed by Geralyn Corwin, DO. With the following instrument(s): Curette to remove Non-Viable tissue/material. Material removed includes Callus, Slough, Skin: Dermis, and Skin: Epidermis after achieving pain control using Lidocaine 4% Topical Solution. No specimens were taken. A time out was conducted at 14:05, prior to the start of the procedure. A Minimum amount of bleeding was controlled with Pressure. The procedure was tolerated well. Post Debridement Measurements: 2cm length x 1.5cm width x 0.2cm depth; 0.471cm^3 volume. Character of Wound/Ulcer Post Debridement is improved. Severity of Tissue Post Debridement is: Fat layer exposed. Post procedure Diagnosis Wound #3: Same as Pre-Procedure General Notes: Scribed for Dr Mikey Bussing by Redmond Pulling, RN. Plan Follow-up Appointments: Return Appointment in 2 weeks. - Dr.Dmya Long Room 9 Thursday 06/26/23 @ 11:00 Cellular or Tissue Based Products: Cellular or Tissue Based Product Type: - apligraf and grafix 50% coinsurance 02/28/23-apply 1st Grafix 03/07/2023 HOLD THIS WEEK. Bathing/ Shower/ Hygiene: May shower with protection but do not get wound dressing(s) wet. Protect dressing(s) with water repellant cover (for example, large plastic bag) or a cast cover and may then take shower. Off-Loading: Other: - keep pressure off left lateral foot; use  peg assist as insert in surgical shoe The following medication(s) was prescribed: lidocaine topical 4 % cream cream topical once daily was prescribed at facility WOUND #3: - Foot Wound Laterality: Plantar, Left Cleanser: Soap and Water 1 x Per Day/15 Days Discharge Instructions: May shower and wash wound with dial antibacterial soap and water prior to dressing change. Cleanser: Vashe 5.8 (oz) 1 x Per Day/15 Days Discharge Instructions: Cleanse the wound with Vashe prior to applying a clean dressing using gauze sponges, not tissue or cotton balls. Peri-Wound Care: Zinc Oxide Ointment 30g tube 1 x Per Day/15 Days Discharge Instructions: Apply Zinc Oxide to periwound with each dressing change Prim Dressing: 2x2 gauze 1 x Per Day/15 Days ary Discharge Instructions: sock gauze with vashe. Prim Dressing: vashe 1 x Per Day/15 Days ary Discharge Instructions: damp gauze to wound bed Secondary Dressing: ABD Pad, 5x9 1 x Per Day/15 Days Discharge Instructions: ABD pad donut. Secured With: [employer], 4.5x3.1 (in/yd) 1 x Per Day/15 Days Discharge Instructions: Secure with Kerlix as directed. Secured With: 6M Medipore H Soft Cloth Surgical T ape, 4 x 10 (in/yd) 1 x Per Day/15 Days Discharge Instructions: Secure with tape as directed. 1. In office sharp debridement 2. Vashe wet-to-dry dressings 3. Aggressive offloading 4. Follow-up in 2 weeks [name], [name] ([id number]) [id number] Page 9 of 11 Electronic Signature(s) Signed: 06/16/2023 12:26:35 PM By: Geralyn Corwin DO Entered By: Geralyn Corwin  on 06/12/2023 14:25:24 -------------------------------------------------------------------------------- HxROS Details Patient Name: Date of Service: [name] [place], [name] RO LD 06/12/2023 2:00 PM Medical Record Number: [id number] Patient Account Number: [id number] Date of Birth/Sex: Treating RN: [birth year] (52 y.o. M) Primary Care Provider: [name] Other  Clinician: Referring Provider: Treating Provider/Extender: [name] in Treatment: 19 Information Obtained From Patient Eyes Medical History: Negative for: Cataracts; Glaucoma; Optic Neuritis Ear/Nose/Mouth/Throat Medical History: Positive for: Chronic sinus problems/congestion Negative for: Middle ear problems Hematologic/Lymphatic Medical History: Negative for: Anemia; Hemophilia; Human Immunodeficiency Virus; Lymphedema; Sickle Cell Disease Respiratory Medical History: Negative for: Aspiration; Asthma; Chronic Obstructive Pulmonary Disease (COPD); Pneumothorax; Sleep Apnea; Tuberculosis Cardiovascular Medical History: Positive for: Congestive Heart Failure; Coronary Artery Disease; Hypertension; Myocardial Infarction - age 89 Negative for: Angina; Arrhythmia; Deep Vein Thrombosis; Hypotension; Peripheral Arterial Disease; Peripheral Venous Disease; Phlebitis; Vasculitis Past Medical History Notes: Ischemic Cardiomyopathy Gastrointestinal Medical History: Negative for: Cirrhosis ; Colitis; Crohns; Hepatitis A; Hepatitis B; Hepatitis C Endocrine Medical History: Positive for: Type II Diabetes Time with diabetes: ten years Treated with: Insulin Blood sugar tested every day: No Genitourinary Medical History: Negative for: End Stage Renal Disease Past Medical History Notes: Renal Insufficiency Immunological Medical History: Negative for: Lupus Erythematosus; [place] Scleroderma AZIYAH, SCHROTENBOER ([id number]) [id number] Page 10 of 11 Integumentary (Skin) Medical History: Negative for: History of Burn Musculoskeletal Medical History: Negative for: Gout; Rheumatoid Arthritis; Osteoarthritis; Osteomyelitis Neurologic Medical History: Positive for: Neuropathy Negative for: Dementia; Quadriplegia; Paraplegia; Seizure Disorder Oncologic Medical History: Negative for: Received Chemotherapy; Received  Radiation Psychiatric Medical History: Negative for: Anorexia/bulimia; Confinement Anxiety HBO Extended History Items Ear/Nose/Mouth/Throat: Chronic sinus problems/congestion Immunizations Pneumococcal Vaccine: Received Pneumococcal Vaccination: No Implantable Devices None Hospitalization / Surgery History Type of Hospitalization/Surgery Heart Cath in 2018 Family and Social History Cancer: No; Diabetes: No; Heart Disease: Yes - Mother,Father; Hereditary Spherocytosis: No; Hypertension: No; Kidney Disease: No; Lung Disease: No; Seizures: No; Stroke: No; Thyroid Problems: No; Tuberculosis: No; Never smoker; Marital Status - Single; Alcohol Use: Rarely; Drug Use: No History; Caffeine Use: Rarely; Financial Concerns: No; Food, Clothing or Shelter Needs: No; Support System Lacking: No; Transportation Concerns: No Electronic Signature(s) Signed: 06/16/2023 12:26:35 PM By: Geralyn Corwin DO Entered By: Geralyn Corwin on 06/12/2023 14:22:58 -------------------------------------------------------------------------------- SuperBill Details Patient Name: Date of Service: [name] [place], [name] 06/12/2023 Medical Record Number: [id number] Patient Account Number: [id number] Date of Birth/Sex: Treating RN: 01-Feb-1971 (52 y.o. M) Primary Care Provider: [name] Other Clinician: Referring Provider: Treating Provider/Extender: [name] in Treatment: 19 Diagnosis Coding ICD-10 Codes Code Description [phone] 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.621 Type 2 diabetes mellitus with foot ulcer KEVEON, NIVEN ([id number]) [id number] Page 11 of [phone] Acquired absence of other left toe(s) Facility Procedures : CPT4 Code: [id number] Description: [phone] - DEBRIDE WOUND 1ST 20 SQ CM OR < ICD-10 Diagnosis Description L97.522 Non-pressure chronic ulcer of other part of left foot with fat layer  exposed E11.621 Type 2 diabetes mellitus with foot ulcer Modifier: Quantity: 1 Physician Procedures : CPT4 Code Description Modifier [id number] - WC PHYS DEBR WO ANESTH 20 SQ CM ICD-10 Diagnosis Description L97.522 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.621 Type 2 diabetes mellitus with foot ulcer Quantity: 1 Electronic Signature(s) Signed: 06/16/2023 12:26:35 PM By: Geralyn Corwin DO Entered By: Geralyn Corwin on 06/12/2023 14:25:34

## 2023-06-16 NOTE — Progress Notes (Signed)
REIN, HEIS ([id number]) [id number] Page 1 of 7 Visit Report for 06/12/2023 Arrival Information Details Patient Name: Date of Service: [name] [place], [name] RO LD 06/12/2023 2:00 PM Medical Record Number: [id number] Patient Account Number: [id number] Date of Birth/Sex: Treating RN: [name] (52 y.o. [name] Primary Care Zachery Niswander: [name] Other Clinician: Referring Jerriyah Louis: Treating Shakiah Wester/Extender: [name] in Treatment: 19 Visit Information History Since Last Visit Added or deleted any medications: No Patient Arrived: Ambulatory Any new allergies or adverse reactions: No Arrival Time: 13:54 Had a fall or experienced change in No Accompanied By: self activities of daily living that may affect Transfer Assistance: None risk of falls: Patient Identification Verified: Yes Signs or symptoms of abuse/neglect since last visito No Secondary Verification Process Completed: Yes Hospitalized since last visit: No Patient Requires Transmission-Based Precautions: No Implantable device outside of the clinic excluding No Patient Has Alerts: Yes cellular tissue based products placed in the center Patient Alerts: 08/2021 ABI:1.2 TBI 0.79 since last visit: Has Dressing in Place as Prescribed: Yes Pain Present Now: No Electronic Signature(s) Signed: 06/12/2023 4:11:58 PM By: Redmond Pulling RN, BSN Entered By: Redmond Pulling on 06/12/2023 13:55:27 -------------------------------------------------------------------------------- Encounter Discharge Information Details Patient Name: Date of Service: [name], [name] 06/12/2023 2:00 PM Medical Record Number: [id number] Patient Account Number: [id number] Date of Birth/Sex: Treating RN: [name] (52 y.o. [name] Primary Care Khrystyna Schwalm: [name] Other Clinician: Referring Garrett Mitchum: Treating Caro Brundidge/Extender: [name] in  Treatment: 19 Encounter Discharge Information Items Post Procedure Vitals Discharge Condition: Stable Temperature (F): 98.3 Ambulatory Status: Ambulatory Pulse (bpm): 92 Discharge Destination: Home Respiratory Rate (breaths/min): 18 Transportation: Private Auto Blood Pressure (mmHg): 112/78 Accompanied By: SELF Schedule Follow-up Appointment: Yes Clinical Summary of Care: Patient Declined Electronic Signature(s) Signed: 06/12/2023 4:11:58 PM By: Redmond Pulling RN, BSN Entered By: Redmond Pulling on 06/12/2023 14:29:09 [name] ([id number]) [id number] Page 2 of 7 -------------------------------------------------------------------------------- Lower Extremity Assessment Details Patient Name: Date of Service: [name], [name] RO LD 06/12/2023 2:00 PM Medical Record Number: [id number] Patient Account Number: [id number] Date of Birth/Sex: Treating RN: [name] (52 y.o. [name] Primary Care Pardeep Pautz: [name] Other Clinician: Referring Angelus Hoopes: Treating Aldridge Krzyzanowski/Extender: [name] in Treatment: 19 Edema Assessment Assessed: [name]: No] [name]: No] Edema: [Left: N] [Right: o] Calf Left: Right: Point of Measurement: 43 cm From Medial Instep 38.5 cm Ankle Left: Right: Point of Measurement: 9 cm From Medial Instep 23 cm Vascular Assessment Pulses: Dorsalis Pedis Palpable: [Left:Yes] Electronic Signature(s) Signed: 06/12/2023 4:11:58 PM By: Redmond Pulling RN, BSN Entered By: Redmond Pulling on 06/12/2023 13:53:00 -------------------------------------------------------------------------------- Multi Wound Chart Details Patient Name: Date of Service: [name], [name] 06/12/2023 2:00 PM Medical Record Number: [id number] Patient Account Number: [id number] Date of Birth/Sex: Treating RN: [name] (52 y.o. M) Primary Care Teira Arcilla: [name] Other Clinician: Referring Azhar Knope: Treating  Golden Emile/Extender: [name] in Treatment: 19 Vital Signs Height(in): 78 Pulse(bpm): 92 Weight(lbs): 275 Blood Pressure(mmHg): 112/78 Body Mass Index(BMI): 31.8 Temperature(F): 98.3 Respiratory Rate(breaths/min): 18 [3:Photos:] [N/A:N/A] Left, Plantar Foot N/A N/A Wound Location: Gradually Appeared N/A N/A Wounding Event: Diabetic Wound/Ulcer of the Lower N/A N/A Primary Etiology: Extremity Chronic sinus problems/congestion, N/A N/A Comorbid History: Congestive Heart Failure, Coronary Artery Disease, Hypertension, Myocardial Infarction, Type II Diabetes, Neuropathy 10/01/2022 N/A N/A Date Acquired: 74 N/A N/A Weeks of Treatment: Open N/A N/A Wound Status: No N/A N/A Wound Recurrence: Yes N/A N/A Pending [name]  on Presentation: 2x1.5x0.2 N/A N/A Measurements L x W x D (cm) 2.356 N/A N/A A (cm) : rea 0.471 N/A N/A Volume (cm) : 40.00% N/A N/A % Reduction in A rea: 40.00% N/A N/A % Reduction in Volume: Grade 1 N/A N/A Classification: Medium N/A N/A Exudate A mount: Serosanguineous N/A N/A Exudate Type: red, brown N/A N/A Exudate Color: Thickened N/A N/A Wound Margin: Large (67-100%) N/A N/A Granulation A mount: Red N/A N/A Granulation Quality: Small (1-33%) N/A N/A Necrotic A mount: Fat Layer (Subcutaneous Tissue): Yes N/A N/A Exposed Structures: Fascia: No Tendon: No Muscle: No Joint: No Bone: No Medium (34-66%) N/A N/A Epithelialization: Debridement - Selective/Open Wound N/A N/A Debridement: Pre-procedure Verification/Time Out 14:05 N/A N/A Taken: Lidocaine 4% T opical Solution N/A N/A Pain Control: Callus, Slough N/A N/A Tissue Debrided: Skin/Epidermis N/A N/A Level: 3.53 N/A N/A Debridement A (sq cm): rea Curette N/A N/A Instrument: Minimum N/A N/A Bleeding: Pressure N/A N/A Hemostasis A chieved: Procedure was tolerated well N/A N/A Debridement Treatment Response: 2x1.5x0.2 N/A N/A Post  Debridement Measurements L x W x D (cm) 0.471 N/A N/A Post Debridement Volume: (cm) Callus: Yes N/A N/A Periwound Skin Texture: Excoriation: No Induration: No Crepitus: No Rash: No Scarring: No Maceration: No N/A N/A Periwound Skin Moisture: Dry/Scaly: No Atrophie Blanche: No N/A N/A Periwound Skin Color: Cyanosis: No Ecchymosis: No Erythema: No Hemosiderin Staining: No Mottled: No Pallor: No Rubor: No No Abnormality N/A N/A Temperature: Debridement N/A N/A Procedures Performed: Treatment Notes Electronic Signature(s) Signed: 06/16/2023 12:26:35 PM By: Geralyn Corwin DO Entered By: Geralyn Corwin on 06/12/2023 14:21:36 [name] ([id number]) [id number] Page 4 of 7 -------------------------------------------------------------------------------- Multi-Disciplinary Care Plan Details Patient Name: Date of Service: [name] [place], [name] RO LD 06/12/2023 2:00 PM Medical Record Number: [id number] Patient Account Number: [id number] Date of Birth/Sex: Treating RN: [birth year] (52 y.o. [name] Primary Care Kayla Deshaies: [name] Other Clinician: Referring Marlyss Cissell: Treating Kiante Petrovich/Extender: [name] in Treatment: 19 Active Inactive Wound/Skin Impairment Nursing Diagnoses: Impaired tissue integrity Knowledge deficit related to ulceration/compromised skin integrity Goals: Patient will have a decrease in wound volume by X% from date: (specify in notes) Date Initiated: 01/24/2023 Target Resolution Date: 08/30/2023 Goal Status: Active Patient/caregiver will verbalize understanding of skin care regimen Date Initiated: 01/24/2023 Target Resolution Date: 08/30/2023 Goal Status: Active Ulcer/skin breakdown will have a volume reduction of 30% by week 4 Date Initiated: 01/24/2023 Date Inactivated: 03/07/2023 Target Resolution Date: 02/27/2023 Goal Status: Unmet Unmet Reason: larger today. Ulcer/skin breakdown  will have a volume reduction of 50% by week 8 Date Initiated: 01/24/2023 Target Resolution Date: 08/30/2023 Goal Status: Active Interventions: Assess patient/caregiver ability to obtain necessary supplies Assess patient/caregiver ability to perform ulcer/skin care regimen upon admission and as needed Assess ulceration(s) every visit Notes: Electronic Signature(s) Signed: 06/12/2023 4:11:58 PM By: Redmond Pulling RN, BSN Entered By: Redmond Pulling on 06/12/2023 13:56:16 -------------------------------------------------------------------------------- Pain Assessment Details Patient Name: Date of Service: [name], [name] 06/12/2023 2:00 PM Medical Record Number: [id number] Patient Account Number: [id number] Date of Birth/Sex: Treating RN: [name] (52 y.o. [name] Primary Care Debra Calabretta: [name] Other Clinician: Referring Elihue Ebert: Treating Sunshyne Horvath/Extender: [name] in Treatment: 19 Active Problems Location of Pain Severity and Description of Pain Patient Has Paino No Site Locations [place], [place] ([id number]) [id number] Page 5 of 7 Pain Management and Medication Current Pain Management: Electronic Signature(s) Signed: 06/12/2023 4:11:58 PM By: Redmond Pulling RN, BSN Entered By: Redmond Pulling on 06/12/2023 13:55:56 -------------------------------------------------------------------------------- Patient/Caregiver  Education Details Patient Name: Date of Service: [name] [place], [name] RO LD 6/13/2024andnbsp2:00 PM Medical Record Number: [id number] Patient Account Number: [id number] Date of Birth/Gender: Treating RN: [birth year] (52 y.o. [name] Primary Care Physician: [name] Other Clinician: Referring Physician: Treating Physician/Extender: [name] in Treatment: 19 Education Assessment Education Provided To: Patient Education Topics Provided Wound/Skin  Impairment: Methods: Explain/Verbal Responses: State content correctly [employer]) Signed: 06/12/2023 4:11:58 PM By: Redmond Pulling RN, BSN Entered By: Redmond Pulling on 06/12/2023 13:56:32 -------------------------------------------------------------------------------- Wound Assessment Details Patient Name: Date of Service: [name], [name] 06/12/2023 2:00 PM Medical Record Number: [id number] Patient Account Number: [id number] Date of Birth/Sex: Treating RN: [name] (52 y.o. [name] Primary Care Journiee Feldkamp: [name] Other Clinician: Referring Mercia Dowe: Treating Alaysha Jefcoat/Extender: Myna Bright [place], [name] ([id number]) [id number] Page 6 of 7 Weeks in Treatment: 19 Wound Status Wound Number: 3 Primary Diabetic Wound/Ulcer of the Lower Extremity Etiology: Wound Location: Left, Plantar Foot Wound Open Wounding Event: Gradually Appeared Status: Date Acquired: 10/01/2022 Comorbid Chronic sinus problems/congestion, Congestive Heart Failure, Weeks Of Treatment: 19 History: Coronary Artery Disease, Hypertension, Myocardial Infarction, Type Clustered Wound: No II Diabetes, Neuropathy Pending Amputation On Presentation Wound under treatment by Jamine Highfill outside of Wound Center Photos Wound Measurements Length: (cm) 2 Width: (cm) 1.5 Depth: (cm) 0.2 Area: (cm) 2.356 Volume: (cm) 0.471 % Reduction in Area: 40% % Reduction in Volume: 40% Epithelialization: Medium (34-66%) Tunneling: No Undermining: No Wound Description Classification: Grade 1 Wound Margin: Thickened Exudate Amount: Medium Exudate Type: Serosanguineous Exudate Color: red, brown Foul Odor After Cleansing: No Slough/Fibrino Yes Wound Bed Granulation Amount: Large (67-100%) Exposed Structure Granulation Quality: Red Fascia Exposed: No Necrotic Amount: Small (1-33%) Fat Layer (Subcutaneous Tissue) Exposed: Yes Necrotic Quality:  Adherent Slough Tendon Exposed: No Muscle Exposed: No Joint Exposed: No Bone Exposed: No Periwound Skin Texture Texture Color No Abnormalities Noted: No No Abnormalities Noted: No Callus: Yes Atrophie Blanche: No Crepitus: No Cyanosis: No Excoriation: No Ecchymosis: No Induration: No Erythema: No Rash: No Hemosiderin Staining: No Scarring: No Mottled: No Pallor: No Moisture Rubor: No No Abnormalities Noted: No Dry / Scaly: No Temperature / Pain Maceration: No Temperature: No Abnormality Treatment Notes Wound #3 (Foot) Wound Laterality: Plantar, Left Cleanser Soap and Water Discharge Instruction: May shower and wash wound with dial antibacterial soap and water prior to dressing change. Vashe 5.8 (oz) Discharge Instruction: Cleanse the wound with Vashe prior to applying a clean dressing using gauze sponges, not tissue or cotton balls. JAYQUAN, GOTSHALL ([id number]) [id number] Page 7 of 7 Peri-Wound Care Zinc Oxide Ointment 30g tube Discharge Instruction: Apply Zinc Oxide to periwound with each dressing change Topical Primary Dressing 2x2 gauze Discharge Instruction: sock gauze with vashe. vashe Discharge Instruction: damp gauze to wound bed Secondary Dressing ABD Pad, 5x9 Discharge Instruction: ABD pad donut. Secured With [employer], 4.5x3.1 (in/yd) Discharge Instruction: Secure with Kerlix as directed. 29M Medipore H Soft Cloth Surgical T ape, 4 x 10 (in/yd) Discharge Instruction: Secure with tape as directed. Compression Wrap Compression Stockings Add-Ons Electronic Signature(s) Signed: 06/12/2023 4:11:58 PM By: Redmond Pulling RN, BSN Entered By: Redmond Pulling on 06/12/2023 14:00:04 -------------------------------------------------------------------------------- Vitals Details Patient Name: Date of Service: [name], [name] 06/12/2023 2:00 PM Medical Record Number: [id number] Patient Account Number: [id number] Date of  Birth/Sex: Treating RN: [name] (52 y.o. [name] Primary Care Shadana Pry: [name] Other Clinician: Referring Saranda Legrande: Treating Oval Moralez/Extender: [name] in Treatment:  19 Vital Signs Time Taken: 13:50 Temperature (F): 98.3 Height (in): 78 Pulse (bpm): 92 Weight (lbs): 275 Respiratory Rate (breaths/min): 18 Body Mass Index (BMI): 31.8 Blood Pressure (mmHg): 112/78 Reference Range: 80 - 120 mg / dl Electronic Signature(s) Signed: 06/12/2023 4:11:58 PM By: Redmond Pulling RN, BSN Entered By: Redmond Pulling on 06/12/2023 13:55:49

## 2023-06-23 ENCOUNTER — Other Ambulatory Visit: Payer: Self-pay

## 2023-06-26 ENCOUNTER — Encounter (HOSPITAL_BASED_OUTPATIENT_CLINIC_OR_DEPARTMENT_OTHER): Payer: 59 | Admitting: Internal Medicine

## 2023-06-26 DIAGNOSIS — I5042 Chronic combined systolic (congestive) and diastolic (congestive) heart failure: Secondary | ICD-10-CM | POA: Diagnosis not present

## 2023-06-26 DIAGNOSIS — Z89422 Acquired absence of other left toe(s): Secondary | ICD-10-CM

## 2023-06-26 DIAGNOSIS — L97522 Non-pressure chronic ulcer of other part of left foot with fat layer exposed: Secondary | ICD-10-CM | POA: Diagnosis not present

## 2023-06-26 DIAGNOSIS — E785 Hyperlipidemia, unspecified: Secondary | ICD-10-CM | POA: Diagnosis not present

## 2023-06-26 DIAGNOSIS — E11621 Type 2 diabetes mellitus with foot ulcer: Secondary | ICD-10-CM | POA: Diagnosis not present

## 2023-06-26 DIAGNOSIS — Z955 Presence of coronary angioplasty implant and graft: Secondary | ICD-10-CM | POA: Diagnosis not present

## 2023-06-26 DIAGNOSIS — E1140 Type 2 diabetes mellitus with diabetic neuropathy, unspecified: Secondary | ICD-10-CM | POA: Diagnosis not present

## 2023-06-26 DIAGNOSIS — N189 Chronic kidney disease, unspecified: Secondary | ICD-10-CM | POA: Diagnosis not present

## 2023-06-26 DIAGNOSIS — E1122 Type 2 diabetes mellitus with diabetic chronic kidney disease: Secondary | ICD-10-CM | POA: Diagnosis not present

## 2023-06-26 DIAGNOSIS — I2510 Atherosclerotic heart disease of native coronary artery without angina pectoris: Secondary | ICD-10-CM | POA: Diagnosis not present

## 2023-06-26 DIAGNOSIS — I130 Hypertensive heart and chronic kidney disease with heart failure and stage 1 through stage 4 chronic kidney disease, or unspecified chronic kidney disease: Secondary | ICD-10-CM | POA: Diagnosis not present

## 2023-06-27 NOTE — Progress Notes (Signed)
LINC, FLAMENCO ([id number]) [id number] Page 1 of 11 Visit Report for 06/26/2023 Chief Complaint Document Details Patient Name: Date of Service: [name] [place], [name] RO LD 06/26/2023 11:00 A M Medical Record Number: [id number] Patient Account Number: [id number] Date of Birth/Sex: Treating RN: March 09, 1971 (52 y.o. M) Primary Care Provider: [name] Other Clinician: Referring Provider: Treating Provider/Extender: [name] in Treatment: 21 Information Obtained from: Patient Chief Complaint 01/15/2023; left foot wound Electronic Signature(s) Signed: 06/26/2023 4:14:51 PM By: Geralyn Corwin DO Entered By: Geralyn Corwin on 06/26/2023 12:30:46 -------------------------------------------------------------------------------- Debridement Details Patient Name: Date of Service: [name] [place], [name] 06/26/2023 11:00 A M Medical Record Number: [id number] Patient Account Number: [id number] Date of Birth/Sex: Treating RN: [birth year] (52 y.o. [name] Primary Care Provider: [name] Other Clinician: Referring Provider: Treating Provider/Extender: [name] in Treatment: 21 Debridement Performed for Assessment: Wound #3 Left,Plantar Foot Performed By: Physician Geralyn Corwin, DO Debridement Type: Debridement Severity of Tissue Pre Debridement: Fat layer exposed Level of Consciousness (Pre-procedure): Awake and Alert Pre-procedure Verification/Time Out Yes - 10:50 Taken: Start Time: 10:50 Pain Control: Lidocaine 5% topical ointment Percent of Wound Bed Debrided: 100% T Area Debrided (cm): otal 1.88 Tissue and other material debrided: Non-Viable, Callus, Slough, Slough Level: Non-Viable Tissue Debridement Description: Selective/Open Wound Instrument: Curette Bleeding: Minimum Hemostasis Achieved: Pressure End Time: 10:52 Procedural Pain: 0 Post Procedural Pain: 0 Response to  Treatment: Procedure was tolerated well Level of Consciousness (Post- Awake and Alert procedure): Post Debridement Measurements of Total Wound Length: (cm) 2 Width: (cm) 1.2 Depth: (cm) 0.1 Volume: (cm) 0.188 Character of Wound/Ulcer Post Debridement: Improved [name], [name] ([id number]) [id number] Page 2 of 11 Severity of Tissue Post Debridement: Fat layer exposed Post Procedure Diagnosis Same as Pre-procedure Notes Scribed for Dr Mikey Bussing by J.Scotton Electronic Signature(s) Signed: 06/26/2023 4:14:51 PM By: Geralyn Corwin DO Signed: 06/26/2023 5:43:20 PM By: Karie Schwalbe RN Entered By: Karie Schwalbe on 06/26/2023 11:03:26 -------------------------------------------------------------------------------- HPI Details Patient Name: Date of Service: [name] [place], [name] 06/26/2023 11:00 A M Medical Record Number: [id number] Patient Account Number: [id number] Date of Birth/Sex: Treating RN: 23-Nov-1971 (52 y.o. M) Primary Care Provider: [name] Other Clinician: Referring Provider: Treating Provider/Extender: [name] in Treatment: 21 History of Present Illness HPI Description: ADMISSION 01/11/2020 This is a 52 year old man with uncontrolled type 2 diabetes with a recent hemoglobin A1c earlier this year of 13.4. He is on insulin and glipizide. He does not take his blood sugars at home. He does have a follow-up with primary care later this month I believe on January 27. He tells [place] that roughly a month ago he was walking with a shoe with a hole in his foot. He took the shoe off and there was an open wound at roughly the left fourth met head. This has significant undermining and raised edges. He has not noticed any purulence he does not feel unwell. More recently he was taking skin off the bottom of his foot and has a superficial area on the left fifth met head. He has not been offloading this. The patient was in the  ER on 12/20. They gave him Bactroban which she has been using on the wound and 10 days worth of doxycycline. No x-rays were done. He has not had vascular studies. He is also been using hydrogen peroxide. Past medical history type 2 diabetes uncontrolled, chronic systolic heart failure, coronary artery disease with a history of congestive heart failure  with stents. Hypertension hyperlipidemia and chronic renal insufficiency ABI in this clinic was 1.14 on the left. 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 He is uncertain whether he would be able to work if we put him on some form of restriction 1/19; he is generally doing quite well. Using silver alginate on the wounds. Things actually look better. He has a forefoot offloading boot which she seems to be compliant about. He has support at work to stay off his foot is much as possible which is gratifying. Culture I did last week showed a few Enterococcus faecalis. I am going to put him on Augmentin. I talked about ordering an x-ray in my note last week but that does not seem to have happened. We will review reorder the x-ray this week. 1/26; x-ray reordered last week was negative for osteomyelitis. We are using silver alginate on the wound on the third and fifth met heads on the left. He is using a Darco forefoot off loader 2/2; the area on the fifth met head is closed. Third met head is still open with tunneling depth and thick callus. 2/9; the area on the fifth met head remains closed however the third met head again has a small open area on presentation with tunneling in depth and surrounded by thick callus. This looks like a pressure issue. We have been using silver alginate 2/16; the area of the fifth metatarsal head remains closed however the area over the third metatarsal head again is a small open area but with some depth. I do not think this is changed much since last week. He is using Hydrofera Blue with forefoot  off loader. He is not able to use a total contact cast on the left leg because he needs his left leg at work [employer]]. Fortunately the wound does not look infected. I changed him to endoform today 2/26; the area of the fifth metatarsal head remains closed. The area of the third metatarsal head has an even smaller opening this time. I used endoform on this this looks improved. He is offloading this is much as he can and a forefoot off loader on the left. He cannot have a total contact cast because of work responsibilities 3/5; the area on the fifth metatarsal head remains closed the area on the third metatarsal head is also closed on the left foot. 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 He has been following for podiatry for several months for this issue. He is currently using wet-to-dry saline dressings. He currently denies signs of infection. Progresse note from 1/17; Patient presents with a 70-month history of nonhealing ulcer to the left foot secondary to diabetes and inability to offload well the area. He has had multiple debridements in the past to his feet bilaterally. He has had resection of the fourth left met head in the fifth toe Secondary to osteomyelitis. We discussed the importance of glycemic control for wound healing. Due to his blood glucose levels being elevated he is at high risk for infection and thus further amputation. He expressed understanding. He states he is supposed to be referred to an endocrinologist at [facility]  however the referral fell through. Unclear what happened. Offered a referral to endocrinology at  [facility]. Patient was agreeable.Furthermore we discussed the importance of aggressive offloading for his wound healing. This will be the most difficult part of the treatment plan for the patient to do. We discussed a total contact cast however he has declined that at this time. 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 [name], [name] ([id number]) [id number] Page 3 of 11 He is currently using a surgical shoe with a peg assist. It does not fit well so we will give him a new one today. I do not think this is enough offloading. If he is not able to offload this area he will likely end up with a BKA. He is well aware of this. For now I recommended Medihoney and Hydrofera Blue for dressing changes. He will follow-up in our [place] office Since this is closer for him. 1/26; patient presents for follow-up. Last clinic visit I had seen him in [place] and we transferred him to [place] since this is a closer location for him. Progress note above from that visit. Over the past week He has been using Medihoney and Hydrofera Blue to the wound bed. He has been using his surgical shoe with peg assist. He has no issues or complaints today. We discussed doing the total contact cast and he was agreeable to having this placed at next clinic visit. 2/2; patient presents for follow-up. He has been using Vashe wet-to-dry dressings. Plan is for the total contact cast today. 2/5; patient presents for follow-up. The cast was placed 3 days ago. He tolerated this well however had a lot of drainage. He currently denies signs of infection. 2/9; patient presents for follow-up. At last clinic visit we held off on replacing the cast due to drainage. He has been using Vashe wet-to-dry dressings and he is currently taking the oral antibiotics prescribed without issues. He declines a total contact cast today. We ran insurance  verification for skin substitute and due to cost patient declines having this placed. 2/16; patient presents for follow-up. He has decided not to follow-up with podiatry. He continues to decline the total contact cast. He has been using Vashe wet- to-dry dressings. He has not been wearing his surgical shoe with peg assist. He currently denies signs of infection. 3/1; patient presents for follow-up. He has been doing Vashe wet-to-dry dressings. He has a surgical shoe with peg assist. We discussed potentially doing a skin substitute for which she has been approved for by insurance. He knows the out-of-pocket cost of this and would still like to proceed with having this placed today. He denies the current total contact cast. 3/8; patient presents for follow-up. He states that the skin substitute came off after a few days and he has been using Vashe wet-to-dry dressings. He has mild odor to the wound bed on exam. 3/15; patient presents for follow-up. He has been using Vashe wet-to-dry dressings and started his course of Augmentin. He reports improvement in wound healing. He has no issues or complaints today. 3/21; patient presents for follow-up. He continues to use Vashe wet-to-dry dressings. He has no issues or complaints today. 3/28; patient presents for follow-up. He has been using Vashe wet-to-dry dressings. He has no issues or complaints today. He has been using his peg assist with surgical shoe. 4/11; patient presents for follow-up. He has been using  Vashe wet-to-dry dressings. The periwound is macerated. He has been using his peg assist with surgical shoe. He has no issues or complaints today. 4/25; patient presents for follow-up. Has been using Vashe wet-to-dry dressings. He has been using zinc oxide to the periwound. For offloading he has been using his peg assist in surgical shoe. He has no issues or complaints today. 5/9; patient presents for follow-up. He has been using Vashe wet-to-dry  dressings. He has no issues or complaints today. He has a surgical shoe with peg assist for offloading. 5/23; patient presents for follow up. He has been using Vashe to clean the wound bed and collagen and blast X with dressing changes. [place] the [place] 2 [place]. Wound is bigger. 5/31; patient presents for follow-up. He has been using Vashe wet-to-dry dressings. Wound is stable but with healthier granulation tissue. Patient would like to hold off on the total contact cast and try a knee scooter for offloading. 6/13; patient presents for follow-up. He has been using Vashe wet-to-dry dressings. He has been using a knee scooter for offloading however states he felt unstable on this and decided to buy an electric scooter. This arrived in the mail today. He would like to hold off on the left total contact cast. Wound is slightly smaller today. 6/27; patient presents for follow-up. He has been using Vashe wet-to-dry dressings. He is using an Art gallery manager for mobility to help relieve the pressure off the wound bed. Wound is smaller today. He wants to hold off on doing the total contact cast as he is showing signs of improvement with wound healing. Electronic Signature(s) Signed: 06/26/2023 4:14:51 PM By: Geralyn Corwin DO Entered By: Geralyn Corwin on 06/26/2023 12:33:17 -------------------------------------------------------------------------------- Physical Exam Details Patient Name: Date of Service: [name] [place], [name] 06/26/2023 11:00 A M Medical Record Number: [id number] Patient Account Number: [id number] Date of Birth/Sex: Treating RN: May 25, 1971 (52 y.o. M) Primary Care Provider: [name] Other Clinician: Referring Provider: Treating Provider/Extender: [name] in Treatment: 21 Constitutional respirations regular, non-labored and within target range for patient.. Cardiovascular 2+ dorsalis pedis/posterior tibialis pulses. MUSCAB, GOODLOW ([id number]) [id number] Page 4 of 11 Psychiatric pleasant and cooperative. Notes Left foot: T the lateral aspect, fifth met head there is an open wound with granulation tissue, callus And slough. No signs of infection. o Electronic Signature(s) Signed: 06/26/2023 4:14:51 PM By: Geralyn Corwin DO Entered By: Geralyn Corwin on 06/26/2023 12:34:05 -------------------------------------------------------------------------------- Physician Orders Details Patient Name: Date of Service: [name] [place], [name] 06/26/2023 11:00 A M Medical Record Number: [id number] Patient Account Number: [id number] Date of Birth/Sex: Treating RN: Aug 01, 1971 (52 y.o. [name] Primary Care Provider: [name] Other Clinician: Referring Provider: Treating Provider/Extender: [name] in Treatment: 21 Verbal / Phone Orders: No Diagnosis Coding Follow-up Appointments ppointment in 2 weeks. - Dr. Mikey Bussing Room 9 Thursday Return A Cellular or Tissue Based Products Cellular or Tissue Based Product Type: - apligraf and grafix 50% coinsurance 02/28/23-apply 1st Grafix 03/07/2023 HOLD THIS WEEK. Bathing/ Shower/ Hygiene May shower with protection but do not get wound dressing(s) wet. Protect dressing(s) with water repellant cover (for example, large plastic bag) or a cast cover and may then take shower. Off-Loading Other: - keep pressure off left lateral foot; use peg assist as insert in surgical shoe Wound Treatment Wound #3 - Foot Wound Laterality: Plantar, Left Cleanser: Soap and Water 1 x Per Day/15 Days Discharge Instructions: May shower and wash  wound with dial antibacterial soap and water prior to dressing change. Cleanser: Vashe 5.8 (oz) 1 x Per Day/15 Days Discharge Instructions: Cleanse the wound with Vashe prior to applying a clean dressing using gauze sponges, not tissue or cotton balls. Peri-Wound Care: Zinc Oxide Ointment  30g tube 1 x Per Day/15 Days Discharge Instructions: Apply Zinc Oxide to periwound with each dressing change Prim Dressing: 2x2 gauze 1 x Per Day/15 Days ary Discharge Instructions: sock gauze with vashe. Prim Dressing: vashe 1 x Per Day/15 Days ary Discharge Instructions: damp gauze to wound bed Secondary Dressing: ABD Pad, 5x9 1 x Per Day/15 Days Discharge Instructions: ABD pad donut. Secured With: [employer], 4.5x3.1 (in/yd) 1 x Per Day/15 Days Discharge Instructions: Secure with Kerlix as directed. Secured With: 29M Medipore H Soft Cloth Surgical T ape, 4 x 10 (in/yd) 1 x Per Day/15 Days Discharge Instructions: Secure with tape as directed. Electronic Signature(s) Signed: 06/26/2023 4:14:51 PM By: Kingsley Plan, [name] ([id number]) [id number] Page 5 of 11 Entered By: Geralyn Corwin on 06/26/2023 12:34:29 -------------------------------------------------------------------------------- Problem List Details Patient Name: Date of Service: [name] [place], [name] RO LD 06/26/2023 11:00 A M Medical Record Number: [id number] Patient Account Number: [id number] Date of Birth/Sex: Treating RN: 16-Oct-1971 (52 y.o. M) Primary Care Provider: [name] Other Clinician: Referring Provider: Treating Provider/Extender: [name] in Treatment: 21 Active Problems ICD-10 Encounter Code Description Active Date MDM Diagnosis [phone] Non-pressure chronic ulcer of other part of left foot with fat layer exposed 01/24/2023 No Yes E11.621 Type 2 diabetes mellitus with foot ulcer 01/24/2023 No Yes Z89.422 Acquired absence of other left toe(s) 01/24/2023 No Yes Inactive Problems Resolved Problems Electronic Signature(s) Signed: 06/26/2023 4:14:51 PM By: Geralyn Corwin DO Entered By: Geralyn Corwin on 06/26/2023 12:30:31 -------------------------------------------------------------------------------- Progress Note  Details Patient Name: Date of Service: [name] [place], [name] 06/26/2023 11:00 A M Medical Record Number: [id number] Patient Account Number: [id number] Date of Birth/Sex: Treating RN: [birth year] (52 y.o. M) Primary Care Provider: [name] Other Clinician: Referring Provider: Treating Provider/Extender: [name] in Treatment: 21 Subjective Chief Complaint Information obtained from Patient 01/15/2023; left foot wound History of Present Illness (HPI) ADMISSION 01/11/2020 This is a 52 year old man with uncontrolled type 2 diabetes with a recent hemoglobin A1c earlier this year of 13.4. He is on insulin and glipizide. He does not take his blood sugars at home. He does have a follow-up with primary care later this month I believe on January 27. He tells [place] that roughly a month ago he was walking with a shoe with a hole in his foot. He took the shoe off and there was an open wound at roughly the left fourth met head. This has significant [name], [name] ([id number]) [id number] Page 6 of 11 undermining and raised edges. He has not noticed any purulence he does not feel unwell. More recently he was taking skin off the bottom of his foot and has a superficial area on the left fifth met head. He has not been offloading this. The patient was in the ER on 12/20. They gave him Bactroban which she has been using on the wound and 10 days worth of doxycycline. No x-rays were done. He has not had vascular studies. He is also been using hydrogen peroxide. Past medical history type 2 diabetes uncontrolled, chronic systolic heart failure, coronary artery disease with a history of congestive heart failure with stents. Hypertension hyperlipidemia and chronic renal insufficiency ABI in this clinic  was 1.14 on the left. 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 He is uncertain whether he would be able to work if we put him on  some form of restriction 1/19; he is generally doing quite well. Using silver alginate on the wounds. Things actually look better. He has a forefoot offloading boot which she seems to be compliant about. He has support at work to stay off his foot is much as possible which is gratifying. Culture I did last week showed a few Enterococcus faecalis. I am going to put him on Augmentin. I talked about ordering an x-ray in my note last week but that does not seem to have happened. We will review reorder the x-ray this week. 1/26; x-ray reordered last week was negative for osteomyelitis. We are using silver alginate on the wound on the third and fifth met heads on the left. He is using a Darco forefoot off loader 2/2; the area on the fifth met head is closed. Third met head is still open with tunneling depth and thick callus. 2/9; the area on the fifth met head remains closed however the third met head again has a small open area on presentation with tunneling in depth and surrounded by thick callus. This looks like a pressure issue. We have been using silver alginate 2/16; the area of the fifth metatarsal head remains closed however the area over the third metatarsal head again is a small open area but with some depth. I do not think this is changed much since last week. He is using Hydrofera Blue with forefoot off loader. He is not able to use a total contact cast on the left leg because he needs his left leg at work [employer]]. Fortunately the wound does not look infected. I changed him to endoform today 2/26; the area of the fifth metatarsal head remains closed. The area of the third metatarsal head has an even smaller opening this time. I used endoform on this this looks improved. He is offloading this is much as he can and a forefoot off loader on the left. He cannot have a total contact cast because of work responsibilities 3/5; the area on the fifth metatarsal head remains closed the area on  the third metatarsal head is also closed on the left foot. 01/15/2023 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 He has been following for podiatry for several months for this issue. He is currently using wet-to-dry saline dressings. He currently denies signs of infection. Progresse note from 1/17; Patient presents with a 60-month history of nonhealing ulcer to the left foot secondary to diabetes and inability to offload well the area. He has had multiple debridements in the past to his feet bilaterally. He has had resection of the fourth left met head in the fifth toe Secondary to osteomyelitis. We discussed the importance of glycemic control for wound healing. Due to his blood glucose levels being elevated he is at high risk for infection and thus further amputation. He expressed understanding. He states he is supposed to be referred to an endocrinologist at [facility] however the referral fell through. Unclear what happened. Offered a referral  to endocrinology at [facility]. Patient was agreeable.Furthermore we discussed the importance of aggressive offloading for his wound healing. This will be the most difficult part of the treatment plan for the patient to do. We discussed a total contact cast however he has declined that at this time. 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 He is currently using a surgical shoe with a peg assist. It does not fit well so we will give him a new one today. I do not think this is enough offloading. If he is not able to offload this area he will likely end  up with a BKA. He is well aware of this. For now I recommended Medihoney and Hydrofera Blue for dressing changes. He will follow-up in our [place] office Since this is closer for him. 1/26; patient presents for follow-up. Last clinic visit I had seen him in [place] and we transferred him to [place] since this is a closer location for him. Progress note above from that visit. Over the past week He has been using Medihoney and Hydrofera Blue to the wound bed. He has been using his surgical shoe with peg assist. He has no issues or complaints today. We discussed doing the total contact cast and he was agreeable to having this placed at next clinic visit. 2/2; patient presents for follow-up. He has been using Vashe wet-to-dry dressings. Plan is for the total contact cast today. 2/5; patient presents for follow-up. The cast was placed 3 days ago. He tolerated this well however had a lot of drainage. He currently denies signs of infection. 2/9; patient presents for follow-up. At last clinic visit we held off on replacing the cast due to drainage. He has been using Vashe wet-to-dry dressings and he is currently taking the oral antibiotics prescribed without issues. He declines a total contact cast today. We ran insurance verification for skin substitute and due to cost patient declines having this placed. 2/16; patient presents for follow-up. He has decided not to follow-up with podiatry. He continues to decline the total contact cast. He has been using Vashe wet- to-dry dressings. He has not been wearing his surgical shoe with peg assist. He currently denies signs of infection. 3/1; patient presents for follow-up. He has been doing Vashe wet-to-dry dressings. He has a surgical shoe with peg assist. We discussed potentially doing a skin substitute for which she has been approved for by insurance. He knows the out-of-pocket cost of this and would still like to proceed with having this placed today.  He denies the current total contact cast. 3/8; patient presents for follow-up. He states that the skin substitute came off after a few days and he has been using Vashe wet-to-dry dressings. He has mild odor to the wound bed on exam. 3/15; patient presents for follow-up. He has been using Vashe wet-to-dry dressings and started his course of Augmentin. He reports improvement in wound healing. He has no issues or complaints today. 3/21; patient presents for follow-up. He continues to use Vashe wet-to-dry dressings. He has no issues or complaints today. 3/28; patient presents for follow-up. He has been using Vashe wet-to-dry dressings. He has no issues or complaints today. He has been using his peg assist with surgical shoe. 4/11; patient presents for follow-up. He has been using Vashe wet-to-dry dressings. The periwound is macerated. He has been using his peg assist with surgical shoe. He has  no issues or complaints today. 4/25; patient presents for follow-up. Has been using Vashe wet-to-dry dressings. He has been using zinc oxide to the periwound. For offloading he has been using his peg assist in surgical shoe. He has no issues or complaints today. 5/9; patient presents for follow-up. He has been using Vashe wet-to-dry dressings. He has no issues or complaints today. He has a surgical shoe with peg assist for offloading. 5/23; patient presents for follow up. He has been using Vashe to clean the wound bed and collagen and blast X with dressing changes. [place] went [place] the beach [place] 2 [place]. Wound is bigger. [name], [name] ([id number]) [id number] Page 7 of 11 5/31; patient presents for follow-up. He has been using Vashe wet-to-dry dressings. Wound is stable but with healthier granulation tissue. Patient would like to hold off on the total contact cast and try a knee scooter for offloading. 6/13; patient presents for follow-up. He has been using Vashe wet-to-dry dressings. He  has been using a knee scooter for offloading however states he felt unstable on this and decided to buy an electric scooter. This arrived in the mail today. He would like to hold off on the left total contact cast. Wound is slightly smaller today. 6/27; patient presents for follow-up. He has been using Vashe wet-to-dry dressings. He is using an Art gallery manager for mobility to help relieve the pressure off the wound bed. Wound is smaller today. He wants to hold off on doing the total contact cast as he is showing signs of improvement with wound healing. Patient History Information obtained from Patient. Family History Heart Disease - Mother,Father, No family history of Cancer, Diabetes, Hereditary Spherocytosis, Hypertension, Kidney Disease, Lung Disease, Seizures, Stroke, Thyroid Problems, Tuberculosis. Social History Never smoker, Marital Status - Single, Alcohol Use - Rarely, Drug Use - No History, Caffeine Use - Rarely. 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 22 Denies history of Angina, Arrhythmia, Deep Vein Thrombosis, Hypotension, Peripheral Arterial Disease, Peripheral Venous Disease, Phlebitis, Vasculitis Gastrointestinal Denies history of Cirrhosis , Colitis, Crohns, Hepatitis A, Hepatitis B, Hepatitis C Endocrine Patient has history of Type II Diabetes Genitourinary Denies history of End Stage Renal Disease Immunological Denies history of  Lupus Erythematosus, Raynauds, Scleroderma Integumentary (Skin) Denies history of History of Burn Musculoskeletal Denies history of Gout, Rheumatoid Arthritis, Osteoarthritis, Osteomyelitis Neurologic Patient has history of Neuropathy Denies history of Dementia, Quadriplegia, Paraplegia, Seizure Disorder Oncologic Denies history of Received Chemotherapy, Received Radiation Psychiatric Denies history of Anorexia/bulimia, Confinement Anxiety Hospitalization/Surgery History - Heart Cath in 2018. Medical A Surgical History Notes nd Cardiovascular Ischemic Cardiomyopathy Genitourinary Renal Insufficiency Objective Constitutional respirations regular, non-labored and within target range for patient.. Vitals Time Taken: 10:34 AM, Height: 78 in, Weight: 275 lbs, BMI: 31.8, Temperature: 98 F, Pulse: 85 bpm, Respiratory Rate: 18 breaths/min, Blood Pressure: 118/85 mmHg. Cardiovascular 2+ dorsalis pedis/posterior tibialis pulses. Psychiatric pleasant and cooperative. General Notes: Left foot: T the lateral aspect, fifth met head there is an open wound with granulation tissue, callus And slough. No signs of infection. [name], [name] ([id number]) [id number] Page 8 of 11 Integumentary (Hair, Skin) Wound #3 status is Open. Original  cause of wound was Gradually Appeared. The date acquired was: 10/01/2022. The wound has been in treatment 21 weeks. The wound is located on the Left,Plantar Foot. The wound measures 2cm length x 1.2cm width x 0.1cm depth; 1.885cm^2 area and 0.188cm^3 volume. There is Fat Layer (Subcutaneous Tissue) exposed. There is no tunneling or undermining noted. There is a medium amount of serosanguineous drainage noted. The wound margin is thickened. There is large (67-100%) red granulation within the wound bed. There is a small (1-33%) amount of necrotic tissue within the wound bed including Adherent Slough. The periwound skin appearance  exhibited: Callus. The periwound skin appearance did not exhibit: Crepitus, Excoriation, Induration, Rash, Scarring, Dry/Scaly, Maceration, Atrophie Blanche, Cyanosis, Ecchymosis, Hemosiderin Staining, Mottled, Pallor, Rubor, Erythema. Periwound temperature was noted as No Abnormality. 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 I debrided nonviable tissue. I recommended continued course with aggressive offloading and Vashe wet-to-dry dressings. Follow-up in 2 weeks. Procedures Wound #3 Pre-procedure diagnosis of Wound #3 is a Diabetic Wound/Ulcer of the Lower Extremity located on the Left,Plantar Foot .Severity of Tissue Pre Debridement is: Fat layer exposed. There was a Selective/Open Wound Non-Viable Tissue Debridement with a total area of 1.88 sq cm performed by Geralyn Corwin, DO. With the following instrument(s): Curette to remove Non-Viable tissue/material. Material removed includes Callus and Slough and after achieving pain control using Lidocaine 5% topical ointment. No specimens were taken. A time out was conducted at 10:50, prior to the start of the procedure. A Minimum amount of bleeding was controlled with Pressure. The procedure was tolerated well with a pain level of 0 throughout and a pain level of 0 following the procedure. Post Debridement Measurements: 2cm length x 1.2cm width x 0.1cm depth; 0.188cm^3 volume. Character of Wound/Ulcer Post Debridement is improved. Severity of Tissue Post Debridement is: Fat layer exposed. Post procedure Diagnosis Wound #3: Same as Pre-Procedure General Notes: Scribed for Dr Mikey Bussing by J.Scotton. Plan Follow-up Appointments: Return Appointment in 2 weeks. - Dr. Mikey Bussing Room 9 Thursday Cellular or Tissue Based  Products: Cellular or Tissue Based Product Type: - apligraf and grafix 50% coinsurance 02/28/23-apply 1st Grafix 03/07/2023 HOLD THIS WEEK. Bathing/ Shower/ Hygiene: May shower with protection but do not get wound dressing(s) wet. Protect dressing(s) with water repellant cover (for example, large plastic bag) or a cast cover and may then take shower. Off-Loading: Other: - keep pressure off left lateral foot; use peg assist as insert in surgical shoe WOUND #3: - Foot Wound Laterality: Plantar, Left Cleanser: Soap and Water 1 x Per Day/15 Days Discharge Instructions: May shower and wash wound with dial antibacterial soap and water prior to dressing change. Cleanser: Vashe 5.8 (oz) 1 x Per Day/15 Days Discharge Instructions: Cleanse the wound with Vashe prior to applying a clean dressing using gauze sponges, not tissue or cotton balls. Peri-Wound Care: Zinc Oxide Ointment 30g tube 1 x Per Day/15 Days Discharge Instructions: Apply Zinc Oxide to periwound with each dressing change Prim Dressing: 2x2 gauze 1 x Per Day/15 Days ary Discharge Instructions: sock gauze with vashe. Prim Dressing: vashe 1 x Per Day/15 Days ary Discharge Instructions: damp gauze to wound bed Secondary Dressing: ABD Pad, 5x9 1 x Per Day/15 Days Discharge Instructions: ABD pad donut. Secured With: [employer], 4.5x3.1 (in/yd) 1 x Per Day/15  Days Discharge Instructions: Secure with Kerlix as directed. Secured With: 42M Medipore H Soft Cloth Surgical T ape, 4 x 10 (in/yd) 1 x Per Day/15 Days Discharge Instructions: Secure with tape as directed. 1. In office sharp debridement 2. Vashe wet-to-dry dressings 3. Aggressive offloadingsurgical shoe with peg assist and electric scooter 4. Follow-up in 2 weeks Electronic Signature(s) Signed: 06/26/2023 4:14:51 PM By: Christella Noa [phone] By: Geralyn Corwin DO [phone].pdf Page 9 of 11 Signed: 06/26/2023  4:14:51 Entered By: Geralyn Corwin on 06/26/2023 12:36:01 -------------------------------------------------------------------------------- HxROS Details Patient Name: Date of Service: [name] [place], [name] RO LD 06/26/2023 11:00 A M Medical Record Number: [id number] Patient Account Number: [id number] Date of Birth/Sex: Treating RN: Nov 28, 1971 (52 y.o. M) Primary Care Provider: [name] Other Clinician: Referring Provider: Treating Provider/Extender: [name] in Treatment: 21 Information Obtained From Patient Eyes Medical History: Negative for: Cataracts; Glaucoma; Optic Neuritis Ear/Nose/Mouth/Throat Medical History: Positive for: Chronic sinus problems/congestion Negative for: Middle ear problems Hematologic/Lymphatic Medical History: Negative for: Anemia; Hemophilia; Human Immunodeficiency Virus; Lymphedema; Sickle Cell Disease Respiratory Medical History: Negative for: Aspiration; Asthma; Chronic Obstructive Pulmonary Disease (COPD); Pneumothorax; Sleep Apnea; Tuberculosis Cardiovascular Medical History: Positive for: Congestive Heart Failure; Coronary Artery Disease; Hypertension; Myocardial Infarction - age 46 Negative for: Angina; Arrhythmia; Deep Vein Thrombosis; Hypotension; Peripheral Arterial Disease; Peripheral Venous Disease; Phlebitis; Vasculitis Past Medical History Notes: Ischemic Cardiomyopathy Gastrointestinal Medical History: Negative for: Cirrhosis ; Colitis; Crohns; Hepatitis A; Hepatitis B; Hepatitis C Endocrine Medical History: Positive for: Type II Diabetes Time with diabetes: ten years Treated with: Insulin Blood sugar tested every day: No Genitourinary Medical History: Negative for: End Stage Renal Disease Past Medical History Notes: Renal Insufficiency Immunological Medical History: Negative for: Lupus Erythematosus; Raynauds; Scleroderma Integumentary (Skin) [name], [name] ([id number])  [id number] Page 10 of 11 Medical History: Negative for: History of Burn Musculoskeletal Medical History: Negative for: Gout; Rheumatoid Arthritis; Osteoarthritis; Osteomyelitis Neurologic Medical History: Positive for: Neuropathy Negative for: Dementia; Quadriplegia; Paraplegia; Seizure Disorder Oncologic Medical History: Negative for: Received Chemotherapy; Received Radiation Psychiatric Medical History: Negative for: Anorexia/bulimia; Confinement Anxiety HBO Extended History Items Ear/Nose/Mouth/Throat: Chronic sinus problems/congestion Immunizations Pneumococcal Vaccine: Received Pneumococcal Vaccination: No Implantable Devices None Hospitalization / Surgery History Type of Hospitalization/Surgery Heart Cath in 2018 Family and Social History Cancer: No; Diabetes: No; Heart Disease: Yes - Mother,Father; Hereditary Spherocytosis: No; Hypertension: No; Kidney Disease: No; Lung Disease: No; Seizures: No; Stroke: No; Thyroid Problems: No; Tuberculosis: No; Never smoker; Marital Status - Single; Alcohol Use: Rarely; Drug Use: No History; Caffeine Use: Rarely; Financial Concerns: No; Food, Clothing or Shelter Needs: No; Support System Lacking: No; Transportation Concerns: No Electronic Signature(s) Signed: 06/26/2023 4:14:51 PM By: Geralyn Corwin DO Entered By: Geralyn Corwin on 06/26/2023 12:33:33 -------------------------------------------------------------------------------- SuperBill Details Patient Name: Date of Service: [name] [place], [name] 06/26/2023 Medical Record Number: [id number] Patient Account Number: [id number] Date of Birth/Sex: Treating RN: [birth year] (52 y.o. M) Primary Care Provider: [name] Other Clinician: Referring Provider: Treating Provider/Extender: [name] in Treatment: 21 Diagnosis Coding ICD-10 Codes Code Description [id number] Non-pressure chronic ulcer of other part of left foot  with fat layer exposed E11.621 Type 2 diabetes mellitus with foot ulcer Z89.422 Acquired absence of other left toe(s) [name], [name] ([id number]) [id number] Page 11 of 11 Facility Procedures : CPT4 Code: [id number] Description: [phone] - DEBRIDE WOUND 1ST 20 SQ CM OR < ICD-10 Diagnosis Description L97.522 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.621  Type 2 diabetes mellitus with foot ulcer Z89.422 Acquired absence of other left  toe(s) Modifier: Quantity: 1 Physician Procedures : CPT4 Code Description Modifier [id number] - WC PHYS DEBR WO ANESTH 20 SQ CM ICD-10 Diagnosis Description L97.522 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.621 Type 2 diabetes mellitus with foot ulcer Z89.422  Acquired absence of other left toe(s) Quantity: 1 Electronic Signature(s) Signed: 06/26/2023 4:14:51 PM By: Geralyn Corwin DO Entered By: Geralyn Corwin on 06/26/2023 12:36:12

## 2023-06-27 NOTE — Progress Notes (Signed)
LEYDEN, ARONOFF ([id number]) [id number] Page 1 of 7 Visit Report for 06/26/2023 Arrival Information Details Patient Name: Date of Service: [name] [name], [name] RO LD 06/26/2023 11:00 A M Medical Record Number: [id number] Patient Account Number: [id number] Date of Birth/Sex: Treating RN: 02/22/1971 (52 y.o. [name] Primary Care Silas Sedam: [name] Other Clinician: Referring Fadia Marlar: Treating Tristyn Demarest/Extender: [name] in Treatment: 21 Visit Information History Since Last Visit Added or deleted any medications: No Patient Arrived: Ambulatory Any new allergies or adverse reactions: No Arrival Time: 10:34 Had a fall or experienced change in No Accompanied By: self activities of daily living that may affect Transfer Assistance: None risk of falls: Patient Identification Verified: Yes Signs or symptoms of abuse/neglect since last visito No Patient Requires Transmission-Based Precautions: No Hospitalized since last visit: No Patient Has Alerts: Yes Has Dressing in Place as Prescribed: Yes Patient Alerts: 08/2021 ABI:1.2 TBI 0.79 Pain Present Now: No Electronic Signature(s) Signed: 06/26/2023 5:43:20 PM By: Karie Schwalbe RN Entered By: Karie Schwalbe on 06/26/2023 10:35:24 -------------------------------------------------------------------------------- Encounter Discharge Information Details Patient Name: Date of Service: [name], [name] 06/26/2023 11:00 A M Medical Record Number: [id number] Patient Account Number: [id number] Date of Birth/Sex: Treating RN: 1971-05-16 (52 y.o. [name] Primary Care Justan Gaede: [name] Other Clinician: Referring Adal Sereno: Treating Lurene Robley/Extender: [name] in Treatment: 21 Encounter Discharge Information Items Post Procedure Vitals Discharge Condition: Stable Temperature (F): 98 Ambulatory Status: Knee Scooter Pulse (bpm):  85 Discharge Destination: Home Respiratory Rate (breaths/min): 18 Transportation: Private Auto Blood Pressure (mmHg): 118/85 Accompanied By: self Schedule Follow-up Appointment: Yes Clinical Summary of Care: Patient Declined Electronic Signature(s) Signed: 06/26/2023 5:43:20 PM By: Karie Schwalbe RN Entered By: Karie Schwalbe on 06/26/2023 17:42:20 [name] ([id number]) [id number] Page 2 of 7 -------------------------------------------------------------------------------- Lower Extremity Assessment Details Patient Name: Date of Service: [name] [place], [name] RO LD 06/26/2023 11:00 A M Medical Record Number: [id number] Patient Account Number: [id number] Date of Birth/Sex: Treating RN: Feb 19, 1971 (52 y.o. [name] Primary Care Taleyah Hillman: [name] Other Clinician: Referring Francys Bolin: Treating Lanesha Azzaro/Extender: [name] in Treatment: 21 Edema Assessment Assessed: [name]: No] [name]: No] Edema: [Left: N] [Right: o] Calf Left: Right: Point of Measurement: 43 cm From Medial Instep 37.2 cm Ankle Left: Right: Point of Measurement: 9 cm From Medial Instep 22.9 cm Vascular Assessment Pulses: Dorsalis Pedis Palpable: [Left:Yes] Electronic Signature(s) Signed: 06/26/2023 5:43:20 PM By: Karie Schwalbe RN Entered By: Karie Schwalbe on 06/26/2023 10:39:14 -------------------------------------------------------------------------------- Multi Wound Chart Details Patient Name: Date of Service: [name], [name] 06/26/2023 11:00 A M Medical Record Number: [id number] Patient Account Number: [id number] Date of Birth/Sex: Treating RN: 03-13-1971 (52 y.o. M) Primary Care Naraya Stoneberg: [name] Other Clinician: Referring Shamona Wirtz: Treating Mandeep Kiser/Extender: [name] in Treatment: 21 Vital Signs Height(in): 78 Pulse(bpm): 85 Weight(lbs): 275 Blood Pressure(mmHg): 118/85 Body Mass  Index(BMI): 31.8 Temperature(F): 98 Respiratory Rate(breaths/min): 18 [3:Photos:] [N/A:N/A] Left, Plantar Foot N/A N/A Wound Location: Gradually Appeared N/A N/A Wounding Event: Diabetic Wound/Ulcer of the Lower N/A N/A Primary Etiology: Extremity Chronic sinus problems/congestion, N/A N/A Comorbid HistoryROMERE, GARDY ([id number]) [id number] Page 3 of 7 Congestive Heart Failure, Coronary Artery Disease, Hypertension, Myocardial Infarction, Type II Diabetes, Neuropathy 10/01/2022 N/A N/A Date Acquired: 21 N/A N/A Weeks of Treatment: Open N/A N/A Wound Status: No N/A N/A Wound Recurrence: Yes N/A N/A Pending [name] on Presentation: 2x1.2x0.1 N/A N/A Measurements L x W x D (cm) 1.885 N/A  N/A A (cm) : rea 0.188 N/A N/A Volume (cm) : 52.00% N/A N/A % Reduction in A rea: 76.10% N/A N/A % Reduction in Volume: Grade 1 N/A N/A Classification: Medium N/A N/A Exudate A mount: Serosanguineous N/A N/A Exudate Type: red, brown N/A N/A Exudate Color: Thickened N/A N/A Wound Margin: Large (67-100%) N/A N/A Granulation A mount: Red N/A N/A Granulation Quality: Small (1-33%) N/A N/A Necrotic A mount: Fat Layer (Subcutaneous Tissue): Yes N/A N/A Exposed Structures: Fascia: No Tendon: No Muscle: No Joint: No Bone: No Medium (34-66%) N/A N/A Epithelialization: Debridement - Selective/Open Wound N/A N/A Debridement: Pre-procedure Verification/Time Out 10:50 N/A N/A Taken: Lidocaine 5% topical ointment N/A N/A Pain Control: Callus, Slough N/A N/A Tissue Debrided: Non-Viable Tissue N/A N/A Level: 1.88 N/A N/A Debridement A (sq cm): rea Curette N/A N/A Instrument: Minimum N/A N/A Bleeding: Pressure N/A N/A Hemostasis A chieved: 0 N/A N/A Procedural Pain: 0 N/A N/A Post Procedural Pain: Procedure was tolerated well N/A N/A Debridement Treatment Response: 2x1.2x0.1 N/A N/A Post Debridement Measurements L x W x D  (cm) 0.188 N/A N/A Post Debridement Volume: (cm) Callus: Yes N/A N/A Periwound Skin Texture: Excoriation: No Induration: No Crepitus: No Rash: No Scarring: No Maceration: No N/A N/A Periwound Skin Moisture: Dry/Scaly: No Atrophie Blanche: No N/A N/A Periwound Skin Color: Cyanosis: No Ecchymosis: No Erythema: No Hemosiderin Staining: No Mottled: No Pallor: No Rubor: No No Abnormality N/A N/A Temperature: Debridement N/A N/A Procedures Performed: Treatment Notes Electronic Signature(s) Signed: 06/26/2023 4:14:51 PM By: Geralyn Corwin DO Entered By: Geralyn Corwin on 06/26/2023 12:30:38 -------------------------------------------------------------------------------- Multi-Disciplinary Care Plan Details Patient Name: Date of Service: [name], [name] 06/26/2023 11:00 A M Medical Record Number: [id number] Patient Account Number: [id number] ZAYLOR, JAQUISH ([id number]) [id number] Page 4 of 7 Date of Birth/Sex: Treating RN: 21-Dec-1971 (52 y.o. [name] Primary Care Emberlin Verner: Other Clinician: [name] Referring Lemario Chaikin: Treating Alesa Echevarria/Extender: [name] in Treatment: 21 Active Inactive Wound/Skin Impairment Nursing Diagnoses: Impaired tissue integrity Knowledge deficit related to ulceration/compromised skin integrity Goals: Patient will have a decrease in wound volume by X% from date: (specify in notes) Date Initiated: 01/24/2023 Target Resolution Date: 08/30/2023 Goal Status: Active Patient/caregiver will verbalize understanding of skin care regimen Date Initiated: 01/24/2023 Target Resolution Date: 08/30/2023 Goal Status: Active Ulcer/skin breakdown will have a volume reduction of 30% by week 4 Date Initiated: 01/24/2023 Date Inactivated: 03/07/2023 Target Resolution Date: 02/27/2023 Goal Status: Unmet Unmet Reason: larger today. Ulcer/skin breakdown will have a volume reduction of 50%  by week 8 Date Initiated: 01/24/2023 Target Resolution Date: 08/30/2023 Goal Status: Active Interventions: Assess patient/caregiver ability to obtain necessary supplies Assess patient/caregiver ability to perform ulcer/skin care regimen upon admission and as needed Assess ulceration(s) every visit Notes: Electronic Signature(s) Signed: 06/26/2023 5:43:20 PM By: Karie Schwalbe RN Entered By: Karie Schwalbe on 06/26/2023 11:03:56 -------------------------------------------------------------------------------- Pain Assessment Details Patient Name: Date of Service: [name], [name] 06/26/2023 11:00 A M Medical Record Number: [id number] Patient Account Number: [id number] Date of Birth/Sex: Treating RN: [birth year] (52 y.o. [name] Primary Care Clebert Wenger: [name] Other Clinician: Referring Erubiel Manasco: Treating Ilene Witcher/Extender: [name] in Treatment: 21 Active Problems Location of Pain Severity and Description of Pain Patient Has Paino No Site Locations [place], [place] ([id number]) [id number] Page 5 of 7 Pain Management and Medication Current Pain Management: Electronic Signature(s) Signed: 06/26/2023 5:43:20 PM By: Karie Schwalbe RN Entered By: Karie Schwalbe on 06/26/2023 10:36:15 -------------------------------------------------------------------------------- Patient/Caregiver Education Details Patient  Name: Date of Service: [name] [place], [name] RO LD 6/27/2024andnbsp11:00 A M Medical Record Number: [id number] Patient Account Number: [id number] Date of Birth/Gender: Treating RN: 12/28/1971 (52 y.o. [name] Primary Care Physician: [name] Other Clinician: Referring Physician: Treating Physician/Extender: [name] in Treatment: 21 Education Assessment Education Provided To: Patient Education Topics Provided Wound/Skin Impairment: Methods:  Explain/Verbal Responses: Return demonstration correctly Electronic Signature(s) Signed: 06/26/2023 5:43:20 PM By: Karie Schwalbe RN Entered By: Karie Schwalbe on 06/26/2023 11:04:12 -------------------------------------------------------------------------------- Wound Assessment Details Patient Name: Date of Service: [name] 06/26/2023 11:00 A M Medical Record Number: [id number] Patient Account Number: [id number] Date of Birth/Sex: Treating RN: 1971/04/20 (52 y.o. [name] Primary Care Rodneshia Greenhouse: [name] Other Clinician: Referring Jaquanda Wickersham: Treating Camilo Mander/Extender: Myna Bright [place], [name] ([id number]) [id number] Page 6 of 7 Weeks in Treatment: 21 Wound Status Wound Number: 3 Primary Diabetic Wound/Ulcer of the Lower Extremity Etiology: Wound Location: Left, Plantar Foot Wound Open Wounding Event: Gradually Appeared Status: Date Acquired: 10/01/2022 Comorbid Chronic sinus problems/congestion, Congestive Heart Failure, Weeks Of Treatment: 21 History: Coronary Artery Disease, Hypertension, Myocardial Infarction, Type Clustered Wound: No II Diabetes, Neuropathy Pending Amputation On Presentation Wound under treatment by Jaymere Alen outside of Wound Center Photos Wound Measurements Length: (cm) 2 Width: (cm) 1.2 Depth: (cm) 0.1 Area: (cm) 1.885 Volume: (cm) 0.188 % Reduction in Area: 52% % Reduction in Volume: 76.1% Epithelialization: Medium (34-66%) Tunneling: No Undermining: No Wound Description Classification: Grade 1 Wound Margin: Thickened Exudate Amount: Medium Exudate Type: Serosanguineous Exudate Color: red, brown Foul Odor After Cleansing: No Slough/Fibrino Yes Wound Bed Granulation Amount: Large (67-100%) Exposed Structure Granulation Quality: Red Fascia Exposed: No Necrotic Amount: Small (1-33%) Fat Layer (Subcutaneous Tissue) Exposed: Yes Necrotic Quality: Adherent  Slough Tendon Exposed: No Muscle Exposed: No Joint Exposed: No Bone Exposed: No Periwound Skin Texture Texture Color No Abnormalities Noted: No No Abnormalities Noted: No Callus: Yes Atrophie Blanche: No Crepitus: No Cyanosis: No Excoriation: No Ecchymosis: No Induration: No Erythema: No Rash: No Hemosiderin Staining: No Scarring: No Mottled: No Pallor: No Moisture Rubor: No No Abnormalities Noted: No Dry / Scaly: No Temperature / Pain Maceration: No Temperature: No Abnormality Treatment Notes Wound #3 (Foot) Wound Laterality: Plantar, Left Cleanser Soap and Water Discharge Instruction: May shower and wash wound with dial antibacterial soap and water prior to dressing change. Vashe 5.8 (oz) Discharge Instruction: Cleanse the wound with Vashe prior to applying a clean dressing using gauze sponges, not tissue or cotton balls. MANDRELL, PRECHTEL ([id number]) [id number] Page 7 of 7 Peri-Wound Care Zinc Oxide Ointment 30g tube Discharge Instruction: Apply Zinc Oxide to periwound with each dressing change Topical Primary Dressing 2x2 gauze Discharge Instruction: sock gauze with vashe. vashe Discharge Instruction: damp gauze to wound bed Secondary Dressing ABD Pad, 5x9 Discharge Instruction: ABD pad donut. Secured With [employer], 4.5x3.1 (in/yd) Discharge Instruction: Secure with Kerlix as directed. 3M Medipore H Soft Cloth Surgical T ape, 4 x 10 (in/yd) Discharge Instruction: Secure with tape as directed. Compression Wrap Compression Stockings Add-Ons Electronic Signature(s) Signed: 06/26/2023 5:43:20 PM By: Karie Schwalbe RN Entered By: Karie Schwalbe on 06/26/2023 10:41:41 -------------------------------------------------------------------------------- Vitals Details Patient Name: Date of Service: [name], [name] 06/26/2023 11:00 A M Medical Record Number: [id number] Patient Account Number: [id number] Date of Birth/Sex:  Treating RN: 1971/05/11 (52 y.o. [name] Primary Care Kipton Skillen: [name] Other Clinician: Referring Page Pucciarelli: Treating Shawnte Demarest/Extender: [name] in Treatment: 21 Vital  Signs Time Taken: 10:34 Temperature (F): 98 Height (in): 78 Pulse (bpm): 85 Weight (lbs): 275 Respiratory Rate (breaths/min): 18 Body Mass Index (BMI): 31.8 Blood Pressure (mmHg): 118/85 Reference Range: 80 - 120 mg / dl Electronic Signature(s) Signed: 06/26/2023 5:43:20 PM By: Karie Schwalbe RN Entered By: Karie Schwalbe on 06/26/2023 10:36:01

## 2023-07-10 ENCOUNTER — Encounter (HOSPITAL_BASED_OUTPATIENT_CLINIC_OR_DEPARTMENT_OTHER): Payer: 59 | Attending: Internal Medicine | Admitting: Internal Medicine

## 2023-07-10 DIAGNOSIS — I252 Old myocardial infarction: Secondary | ICD-10-CM | POA: Diagnosis not present

## 2023-07-10 DIAGNOSIS — E1140 Type 2 diabetes mellitus with diabetic neuropathy, unspecified: Secondary | ICD-10-CM | POA: Insufficient documentation

## 2023-07-10 DIAGNOSIS — E1122 Type 2 diabetes mellitus with diabetic chronic kidney disease: Secondary | ICD-10-CM | POA: Diagnosis not present

## 2023-07-10 DIAGNOSIS — L97522 Non-pressure chronic ulcer of other part of left foot with fat layer exposed: Secondary | ICD-10-CM | POA: Insufficient documentation

## 2023-07-10 DIAGNOSIS — E1151 Type 2 diabetes mellitus with diabetic peripheral angiopathy without gangrene: Secondary | ICD-10-CM | POA: Insufficient documentation

## 2023-07-10 DIAGNOSIS — Z89422 Acquired absence of other left toe(s): Secondary | ICD-10-CM | POA: Diagnosis not present

## 2023-07-10 DIAGNOSIS — I2510 Atherosclerotic heart disease of native coronary artery without angina pectoris: Secondary | ICD-10-CM | POA: Insufficient documentation

## 2023-07-10 DIAGNOSIS — Z7984 Long term (current) use of oral hypoglycemic drugs: Secondary | ICD-10-CM | POA: Insufficient documentation

## 2023-07-10 DIAGNOSIS — I130 Hypertensive heart and chronic kidney disease with heart failure and stage 1 through stage 4 chronic kidney disease, or unspecified chronic kidney disease: Secondary | ICD-10-CM | POA: Insufficient documentation

## 2023-07-10 DIAGNOSIS — E11621 Type 2 diabetes mellitus with foot ulcer: Secondary | ICD-10-CM | POA: Diagnosis not present

## 2023-07-10 DIAGNOSIS — Z955 Presence of coronary angioplasty implant and graft: Secondary | ICD-10-CM | POA: Insufficient documentation

## 2023-07-10 DIAGNOSIS — I5042 Chronic combined systolic (congestive) and diastolic (congestive) heart failure: Secondary | ICD-10-CM | POA: Insufficient documentation

## 2023-07-10 DIAGNOSIS — N189 Chronic kidney disease, unspecified: Secondary | ICD-10-CM | POA: Diagnosis not present

## 2023-07-10 DIAGNOSIS — Z794 Long term (current) use of insulin: Secondary | ICD-10-CM | POA: Diagnosis not present

## 2023-07-10 DIAGNOSIS — E785 Hyperlipidemia, unspecified: Secondary | ICD-10-CM | POA: Insufficient documentation

## 2023-07-10 NOTE — Progress Notes (Signed)
HELIOS, KOHLMANN ([id number]) [id number] Page 1 of 11 Visit Report for 07/10/2023 Chief Complaint Document Details Patient Name: Date of Service: [name] [place], [name] RO LD 07/10/2023 9:30 A M Medical Record Number: [id number] Patient Account Number: [id number] Date of Birth/Sex: Treating RN: 1971-06-17 (52 y.o. M) Primary Care Provider: [name] Other Clinician: Referring Provider: Treating Provider/Extender: [name] in Treatment: 23 Information Obtained from: Patient Chief Complaint 01/15/2023; left foot wound Electronic Signature(s) Signed: 07/10/2023 11:31:25 AM By: Geralyn Corwin DO Entered By: Geralyn Corwin on 07/10/2023 09:54:32 -------------------------------------------------------------------------------- Debridement Details Patient Name: Date of Service: [name] [place], [name] 07/10/2023 9:30 A M Medical Record Number: [id number] Patient Account Number: [id number] Date of Birth/Sex: Treating RN: 02/21/1971 (52 y.o. Valma Cava Primary Care Provider: [name] Other Clinician: Referring Provider: Treating Provider/Extender: [name] in Treatment: 23 Debridement Performed for Assessment: Wound #3 Left,Plantar Foot Performed By: Physician Geralyn Corwin, DO Debridement Type: Debridement Severity of Tissue Pre Debridement: Fat layer exposed Level of Consciousness (Pre-procedure): Awake and Alert Pre-procedure Verification/Time Out Yes - 09:44 Taken: Start Time: 09:45 Pain Control: Lidocaine 5% topical ointment Percent of Wound Bed Debrided: 100% T Area Debrided (cm): otal 1.41 Tissue and other material debrided: Non-Viable, Callus, Slough, Skin: Epidermis, Slough Level: Skin/Epidermis Debridement Description: Selective/Open Wound Instrument: Curette Bleeding: Minimum Hemostasis Achieved: Pressure Response to Treatment: Procedure was tolerated well Level of  Consciousness (Post- Awake and Alert procedure): Post Debridement Measurements of Total Wound Length: (cm) 1.8 Width: (cm) 1 Depth: (cm) 0.1 Volume: (cm) 0.141 Character of Wound/Ulcer Post Debridement: Requires Further Debridement Severity of Tissue Post Debridement: Fat layer exposed Post Procedure Diagnosis [name], Glenn ([id number]) [id number] Page 2 of 11 Same as Pre-procedure Notes Scribed for Dr. Mikey Bussing by Tommie Ard, RN Electronic Signature(s) Signed: 07/10/2023 11:31:25 AM By: Geralyn Corwin DO Signed: 07/10/2023 2:48:02 PM By: Tommie Ard RN Entered By: Tommie Ard on 07/10/2023 09:46:09 -------------------------------------------------------------------------------- HPI Details Patient Name: Date of Service: [name] [place], [name] 07/10/2023 9:30 A M Medical Record Number: [id number] Patient Account Number: [id number] Date of Birth/Sex: Treating RN: [birth year] (51 y.o. M) Primary Care Provider: [name] Other Clinician: Referring Provider: Treating Provider/Extender: [name] in Treatment: 23 History of Present Illness HPI Description: ADMISSION 01/11/2020 This is a 52 year old man with uncontrolled type 2 diabetes with a recent hemoglobin A1c earlier this year of 13.4. He is on insulin and glipizide. He does not take his blood sugars at home. He does have a follow-up with primary care later this month I believe on January 27. He tells [place] that roughly a month ago he was walking with a shoe with a hole in his foot. He took the shoe off and there was an open wound at roughly the left fourth met head. This has significant undermining and raised edges. He has not noticed any purulence he does not feel unwell. More recently he was taking skin off the bottom of his foot and has a superficial area on the left fifth met head. He has not been offloading this. The patient was in the ER on 12/20. They gave him  Bactroban which she has been using on the wound and 10 days worth of doxycycline. No x-rays were done. He has not had vascular studies. He is also been using hydrogen peroxide. Past medical history type 2 diabetes uncontrolled, chronic systolic heart failure, coronary artery disease with a history of congestive heart failure with stents. Hypertension hyperlipidemia and  chronic renal insufficiency ABI in this clinic was 1.14 on the left. 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 He is uncertain whether he would be able to work if we put him on some form of restriction 1/19; he is generally doing quite well. Using silver alginate on the wounds. Things actually look better. He has a forefoot offloading boot which she seems to be compliant about. He has support at work to stay off his foot is much as possible which is gratifying. Culture I did last week showed a few Enterococcus faecalis. I am going to put him on Augmentin. I talked about ordering an x-ray in my note last week but that does not seem to have happened. We will review reorder the x-ray this week. 1/26; x-ray reordered last week was negative for osteomyelitis. We are using silver alginate on the wound on the third and fifth met heads on the left. He is using a Darco forefoot off loader 2/2; the area on the fifth met head is closed. Third met head is still open with tunneling depth and thick callus. 2/9; the area on the fifth met head remains closed however the third met head again has a small open area on presentation with tunneling in depth and surrounded by thick callus. This looks like a pressure issue. We have been using silver alginate 2/16; the area of the fifth metatarsal head remains closed however the area over the third metatarsal head again is a small open area but with some depth. I do not think this is changed much since last week. He is using Hydrofera Blue with forefoot off loader. He is not able  to use a total contact cast on the left leg because he needs his left leg at work [employer]]. Fortunately the wound does not look infected. I changed him to endoform today 2/26; the area of the fifth metatarsal head remains closed. The area of the third metatarsal head has an even smaller opening this time. I used endoform on this this looks improved. He is offloading this is much as he can and a forefoot off loader on the left. He cannot have a total contact cast because of work responsibilities 3/5; the area on the fifth metatarsal head remains closed the area on the third metatarsal head is also closed on the left foot. 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 He has been following for podiatry for several months for this issue. He is currently using wet-to-dry saline dressings. He currently denies signs of infection. Progresse note from 1/17; Patient presents with a 21-month history of nonhealing ulcer to the left foot secondary to diabetes and inability to offload well the area. He has had multiple debridements in the past to his feet bilaterally. He has had resection of the fourth left met head in the fifth toe Secondary to osteomyelitis. We discussed the importance of glycemic control for wound healing. Due to his blood glucose levels being elevated he is at high risk for infection and thus further amputation. He expressed understanding. He states he is supposed to be referred to an endocrinologist at [facility] however the referral fell through.  Unclear what happened. Offered a referral to endocrinology at [facility]. Patient was  agreeable.Furthermore we discussed the importance of aggressive offloading for his wound healing. This will be the most difficult part of the treatment plan for the patient to do. We discussed a total contact cast however he has declined that at this time. 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 He is currently using a surgical shoe with a peg assist. It does not fit well so we will give him a new one today. I do not think this is enough offloading. If he is not able to offload this area he will likely end up with a BKA. He is well aware of this. For now I recommended Medihoney and Hydrofera Blue for dressing changes. He will follow-up in our [place] office Since this is closer for him. DEZMON, CONOVER ([id number]) [id number] Page 3 of 11 1/26; patient presents for follow-up. Last clinic visit I had seen him in [place] and we transferred him to [place] since this is a closer location for him. Progress note above from that visit. Over the past week He has been using Medihoney and Hydrofera Blue to the wound bed. He has been using his surgical shoe with peg assist. He has no issues or complaints today. We discussed doing the total contact cast and he was agreeable to having this placed at next clinic visit. 2/2; patient presents for follow-up. He has been using Vashe wet-to-dry dressings. Plan is for the total contact cast today. 2/5; patient presents for follow-up. The cast was placed 3 days ago. He tolerated this well however had a lot of drainage. He currently denies signs of infection. 2/9; patient presents for follow-up. At last clinic visit we held off on replacing the cast due to drainage. He has been using Vashe wet-to-dry dressings and he is currently taking the oral antibiotics prescribed without issues. He declines a total contact cast today. We ran insurance verification for skin  substitute and due to cost patient declines having this placed. 2/16; patient presents for follow-up. He has decided not to follow-up with podiatry. He continues to decline the total contact cast. He has been using Vashe wet- to-dry dressings. He has not been wearing his surgical shoe with peg assist. He currently denies signs of infection. 3/1; patient presents for follow-up. He has been doing Vashe wet-to-dry dressings. He has a surgical shoe with peg assist. We discussed potentially doing a skin substitute for which she has been approved for by insurance. He knows the out-of-pocket cost of this and would still like to proceed with having this placed today. He denies the current total contact cast. 3/8; patient presents for follow-up. He states that the skin substitute came off after a few days and he has been using Vashe wet-to-dry dressings. He has mild odor to the wound bed on exam. 3/15; patient presents for follow-up. He has been using Vashe wet-to-dry dressings and started his course of Augmentin. He reports improvement in wound healing. He has no issues or complaints today. 3/21; patient presents for follow-up. He continues to use Vashe wet-to-dry dressings. He has no issues or complaints today. 3/28; patient presents for follow-up. He has been using Vashe wet-to-dry dressings. He has no issues or complaints today. He has been using his peg assist with surgical shoe. 4/11; patient presents for follow-up. He has been using Vashe wet-to-dry dressings. The periwound  is macerated. He has been using his peg assist with surgical shoe. He has no issues or complaints today. 4/25; patient presents for follow-up. Has been using Vashe wet-to-dry dressings. He has been using zinc oxide to the periwound. For offloading he has been using his peg assist in surgical shoe. He has no issues or complaints today. 5/9; patient presents for follow-up. He has been using Vashe wet-to-dry dressings. He has no  issues or complaints today. He has a surgical shoe with peg assist for offloading. 5/23; patient presents for follow up. He has been using Vashe to clean the wound bed and collagen and blast X with dressing changes. [place] the [place] 2 [place]. Wound is bigger. 5/31; patient presents for follow-up. He has been using Vashe wet-to-dry dressings. Wound is stable but with healthier granulation tissue. Patient would like to hold off on the total contact cast and try a knee scooter for offloading. 6/13; patient presents for follow-up. He has been using Vashe wet-to-dry dressings. He has been using a knee scooter for offloading however states he felt unstable on this and decided to buy an electric scooter. This arrived in the mail today. He would like to hold off on the left total contact cast. Wound is slightly smaller today. 6/27; patient presents for follow-up. He has been using Vashe wet-to-dry dressings. He is using an Art gallery manager for mobility to help relieve the pressure off the wound bed. Wound is smaller today. He wants to hold off on doing the total contact cast as he is showing signs of improvement with wound healing. 7/11; patient presents for follow-up. He has been using Vashe wet-to-dry dressings and his electric scooter for offloading. Wound is slightly smaller. Electronic Signature(s) Signed: 07/10/2023 11:31:25 AM By: Geralyn Corwin DO Entered By: Geralyn Corwin on 07/10/2023 09:56:04 -------------------------------------------------------------------------------- Physical Exam Details Patient Name: Date of Service: [name], [name] 07/10/2023 9:30 A M Medical Record Number: [id number] Patient Account Number: [id number] Date of Birth/Sex: Treating RN: 1971-06-06 (52 y.o. M) Primary Care Provider: [name] Other Clinician: Referring Provider: Treating Provider/Extender: [name] in Treatment: 23 Constitutional respirations  regular, non-labored and within target range for patient.. Cardiovascular 2+ dorsalis pedis/posterior tibialis pulses. Psychiatric Glenn, [name] ([id number]) [id number] Page 4 of 11 pleasant and cooperative. Notes Left foot: T the lateral aspect, fifth met head there is an open wound with granulation tissue, callus And slough. No signs of infection. o Electronic Signature(s) Signed: 07/10/2023 11:31:25 AM By: Geralyn Corwin DO Entered By: Geralyn Corwin on 07/10/2023 09:56:26 -------------------------------------------------------------------------------- Physician Orders Details Patient Name: Date of Service: [name] [place], [name] 07/10/2023 9:30 A M Medical Record Number: [id number] Patient Account Number: [id number] Date of Birth/Sex: Treating RN: Jul 11, 1971 (52 y.o. Valma Cava Primary Care Provider: [name] Other Clinician: Referring Provider: Treating Provider/Extender: [name] in Treatment: 39 Verbal / Phone Orders: No Diagnosis Coding Follow-up Appointments ppointment in 2 weeks. - Dr. Mikey Bussing Room 9 Thursday Return A Cellular or Tissue Based Products Cellular or Tissue Based Product Type: - apligraf and grafix 50% coinsurance 02/28/23-apply 1st Grafix 03/07/2023 HOLD THIS WEEK. Bathing/ Shower/ Hygiene May shower with protection but do not get wound dressing(s) wet. Protect dressing(s) with water repellant cover (for example, large plastic bag) or a cast cover and may then take shower. Off-Loading Other: - keep pressure off left lateral foot; use peg assist as insert in surgical shoe Wound Treatment Wound #3 - Foot Wound Laterality:  Plantar, Left Cleanser: Soap and Water 1 x Per Day/15 Days Discharge Instructions: May shower and wash wound with dial antibacterial soap and water prior to dressing change. Cleanser: Vashe 5.8 (oz) 1 x Per Day/15 Days Discharge Instructions: Cleanse the wound with  Vashe prior to applying a clean dressing using gauze sponges, not tissue or cotton balls. Peri-Wound Care: Zinc Oxide Ointment 30g tube 1 x Per Day/15 Days Discharge Instructions: Apply Zinc Oxide to periwound with each dressing change Prim Dressing: 2x2 gauze 1 x Per Day/15 Days ary Discharge Instructions: sock gauze with vashe. Prim Dressing: vashe 1 x Per Day/15 Days ary Discharge Instructions: damp gauze to wound bed Secondary Dressing: ABD Pad, 5x9 1 x Per Day/15 Days Discharge Instructions: ABD pad donut. Secured With: [employer], 4.5x3.1 (in/yd) 1 x Per Day/15 Days Discharge Instructions: Secure with Kerlix as directed. Secured With: 72M Medipore H Soft Cloth Surgical T ape, 4 x 10 (in/yd) 1 x Per Day/15 Days Discharge Instructions: Secure with tape as directed. Electronic Signature(s) Signed: 07/10/2023 11:31:25 AM By: Geralyn Corwin DO Entered By: Geralyn Corwin on 07/10/2023 09:56:34 [name] ([id number]) [id number] Page 5 of 11 -------------------------------------------------------------------------------- Problem List Details Patient Name: Date of Service: [name] [place], [name] RO LD 07/10/2023 9:30 A M Medical Record Number: [id number] Patient Account Number: [id number] Date of Birth/Sex: Treating RN: [birth year] (52 y.o. M) Primary Care Provider: [name] Other Clinician: Referring Provider: Treating Provider/Extender: [name] in Treatment: 23 Active Problems ICD-10 Encounter Code Description Active Date MDM Diagnosis [phone] Non-pressure chronic ulcer of other part of left foot with fat layer exposed 01/24/2023 No Yes E11.621 Type 2 diabetes mellitus with foot ulcer 01/24/2023 No Yes Z89.422 Acquired absence of other left toe(s) 01/24/2023 No Yes Inactive Problems Resolved Problems Electronic Signature(s) Signed: 07/10/2023 11:31:25 AM By: Geralyn Corwin DO Entered By: Geralyn Corwin  on 07/10/2023 09:54:20 -------------------------------------------------------------------------------- Progress Note Details Patient Name: Date of Service: [name] [place], [name] 07/10/2023 9:30 A M Medical Record Number: [id number] Patient Account Number: [id number] Date of Birth/Sex: Treating RN: 12/04/1971 (52 y.o. M) Primary Care Provider: [name] Other Clinician: Referring Provider: Treating Provider/Extender: [name] in Treatment: 23 Subjective Chief Complaint Information obtained from Patient 01/15/2023; left foot wound History of Present Illness (HPI) ADMISSION 01/11/2020 This is a 52 year old man with uncontrolled type 2 diabetes with a recent hemoglobin A1c earlier this year of 13.4. He is on insulin and glipizide. He does not take his blood sugars at home. He does have a follow-up with primary care later this month I believe on January 27. He tells [place] that roughly a month ago he was walking with a shoe with a hole in his foot. He took the shoe off and there was an open wound at roughly the left fourth met head. This has significant undermining and raised edges. He has not noticed any purulence he does not feel unwell. More recently he was taking skin off the bottom of his foot and has a superficial area on the left fifth met head. He has not been offloading this. The patient was in the ER on 12/20. They gave him Bactroban which she has been Glenn, [name] ([id number]) [id number] Page 6 of 11 using on the wound and 10 days worth of doxycycline. No x-rays were done. He has not had vascular studies. He is also been using hydrogen peroxide. Past medical history type 2 diabetes uncontrolled, chronic systolic heart failure, coronary artery disease with a  history of congestive heart failure with stents. Hypertension hyperlipidemia and chronic renal insufficiency ABI in this clinic was 1.14 on the left. 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 He is uncertain whether he would be able to work if we put him on some form of restriction 1/19; he is generally doing quite well. Using silver alginate on the wounds. Things actually look better. He has a forefoot offloading boot which she seems to be compliant about. He has support at work to stay off his foot is much as possible which is gratifying. Culture I did last week showed a few Enterococcus faecalis. I am going to put him on Augmentin. I talked about ordering an x-ray in my note last week but that does not seem to have happened. We will review reorder the x-ray this week. 1/26; x-ray reordered last week was negative for osteomyelitis. We are using silver alginate on the wound on the third and fifth met heads on the left. He is using a Darco forefoot off loader 2/2; the area on the fifth met head is closed. Third met head is still open with tunneling depth and thick callus. 2/9; the area on the fifth met head remains closed however the third met head again has a small open area on presentation with tunneling in depth and surrounded by thick callus. This looks like a pressure issue. We have been using silver alginate 2/16; the area of the fifth metatarsal head remains closed however the area over the third metatarsal head again is a small open area but with some depth. I do not think this is changed much since last week. He is using Hydrofera Blue with forefoot off loader. He is not able to use a total contact cast on the left leg because he needs his left leg at work [employer]]. Fortunately the wound does not look infected. I changed him to endoform today 2/26; the area of the fifth metatarsal head remains closed. The area of the third metatarsal head has an even smaller opening this time. I used endoform on this this looks improved. He is offloading this is much as he can and a forefoot off loader on the left. He cannot have a  total contact cast because of work responsibilities 3/5; the area on the fifth metatarsal head remains closed the area on the third metatarsal head is also closed on the left foot. 01/15/2023 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 He has been following for podiatry for several months for this issue. He is currently using wet-to-dry saline dressings. He currently denies signs of infection. Progresse note from 1/17; Patient presents with a 19-month history of nonhealing ulcer to the left foot secondary to diabetes and inability to offload well the area. He has had multiple debridements in the past to his feet bilaterally. He has had resection of the fourth left met head in the fifth toe Secondary to osteomyelitis. We discussed the importance of glycemic control for wound healing. Due to his blood glucose levels being elevated he is at high risk for infection and thus further amputation. He expressed understanding. He states he is supposed to be  referred to an endocrinologist at [facility] however the referral fell through. Unclear what happened. Offered a referral to endocrinology at [facility]. Patient was agreeable.Furthermore we discussed the importance of aggressive offloading for his wound healing. This will be the most difficult part of the treatment plan for the patient to do. We discussed a total contact cast however he has declined that at this time. 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 He is currently using a surgical shoe with a peg assist. It does not fit well so we will  give him a new one today. I do not think this is enough offloading. If he is not able to offload this area he will likely end up with a BKA. He is well aware of this. For now I recommended Medihoney and Hydrofera Blue for dressing changes. He will follow-up in our [place] office Since this is closer for him. 1/26; patient presents for follow-up. Last clinic visit I had seen him in [place] and we transferred him to [place] since this is a closer location for him. Progress note above from that visit. Over the past week He has been using Medihoney and Hydrofera Blue to the wound bed. He has been using his surgical shoe with peg assist. He has no issues or complaints today. We discussed doing the total contact cast and he was agreeable to having this placed at next clinic visit. 2/2; patient presents for follow-up. He has been using Vashe wet-to-dry dressings. Plan is for the total contact cast today. 2/5; patient presents for follow-up. The cast was placed 3 days ago. He tolerated this well however had a lot of drainage. He currently denies signs of infection. 2/9; patient presents for follow-up. At last clinic visit we held off on replacing the cast due to drainage. He has been using Vashe wet-to-dry dressings and he is currently taking the oral antibiotics prescribed without issues. He declines a total contact cast today. We ran insurance verification for skin substitute and due to cost patient declines having this placed. 2/16; patient presents for follow-up. He has decided not to follow-up with podiatry. He continues to decline the total contact cast. He has been using Vashe wet- to-dry dressings. He has not been wearing his surgical shoe with peg assist. He currently denies signs of infection. 3/1; patient presents for follow-up. He has been doing Vashe wet-to-dry dressings. He has a surgical shoe with peg assist. We discussed potentially doing a skin substitute for which she has been  approved for by insurance. He knows the out-of-pocket cost of this and would still like to proceed with having this placed today. He denies the current total contact cast. 3/8; patient presents for follow-up. He states that the skin substitute came off after a few days and he has been using Vashe wet-to-dry dressings. He has mild odor to the wound bed on exam. 3/15; patient presents for follow-up. He has been using Vashe wet-to-dry dressings and started his course of Augmentin. He reports improvement in wound healing. He has no issues or complaints today. 3/21; patient presents for follow-up. He continues to use Vashe wet-to-dry dressings. He has no issues or complaints today. 3/28; patient presents for follow-up. He has been using Vashe wet-to-dry dressings. He has no issues or complaints today. He has been using his peg assist with surgical shoe. 4/11; patient presents for follow-up. He has been using Vashe wet-to-dry  dressings. The periwound is macerated. He has been using his peg assist with surgical shoe. He has no issues or complaints today. 4/25; patient presents for follow-up. Has been using Vashe wet-to-dry dressings. He has been using zinc oxide to the periwound. For offloading he has been using his peg assist in surgical shoe. He has no issues or complaints today. 5/9; patient presents for follow-up. He has been using Vashe wet-to-dry dressings. He has no issues or complaints today. He has a surgical shoe with peg assist for offloading. 5/23; patient presents for follow up. He has been using Vashe to clean the wound bed and collagen and blast X with dressing changes. [place] went [place] the [place] 2 [place]. Wound is bigger. 5/31; patient presents for follow-up. He has been using Vashe wet-to-dry dressings. Wound is stable but with healthier granulation tissue. Patient would like to hold off on the total contact cast and try a knee scooter for offloading. Glenn, [name] ([id number])  [id number] Page 7 of 11 6/13; patient presents for follow-up. He has been using Vashe wet-to-dry dressings. He has been using a knee scooter for offloading however states he felt unstable on this and decided to buy an electric scooter. This arrived in the mail today. He would like to hold off on the left total contact cast. Wound is slightly smaller today. 6/27; patient presents for follow-up. He has been using Vashe wet-to-dry dressings. He is using an Art gallery manager for mobility to help relieve the pressure off the wound bed. Wound is smaller today. He wants to hold off on doing the total contact cast as he is showing signs of improvement with wound healing. 7/11; patient presents for follow-up. He has been using Vashe wet-to-dry dressings and his electric scooter for offloading. Wound is slightly smaller. Patient History Information obtained from Patient. Family History Heart Disease - Mother,Father, No family history of Cancer, Diabetes, Hereditary Spherocytosis, Hypertension, Kidney Disease, Lung Disease, Seizures, Stroke, Thyroid Problems, Tuberculosis. Social History Never smoker, Marital Status - Single, Alcohol Use - Rarely, Drug Use - No History, Caffeine Use - Rarely. 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 69 Denies history of Angina, Arrhythmia, Deep Vein Thrombosis, Hypotension, Peripheral Arterial Disease,  Peripheral Venous Disease, Phlebitis, Vasculitis Gastrointestinal Denies history of Cirrhosis , Colitis, Crohns, Hepatitis A, Hepatitis B, Hepatitis C Endocrine Patient has history of Type II Diabetes Genitourinary Denies history of End Stage Renal Disease Immunological Denies history of Lupus Erythematosus, Raynauds, [name] Integumentary (Skin) Denies history of History of Burn Musculoskeletal Denies history of Gout, Rheumatoid Arthritis, Osteoarthritis, Osteomyelitis Neurologic Patient has history of Neuropathy Denies history of Dementia, Quadriplegia, Paraplegia, Seizure Disorder Oncologic Denies history of Received Chemotherapy, Received Radiation Psychiatric Denies history of Anorexia/bulimia, Confinement Anxiety Hospitalization/Surgery History - Heart Cath in 2018. Medical A Surgical History Notes nd Cardiovascular Ischemic Cardiomyopathy Genitourinary Renal Insufficiency Objective Constitutional respirations regular, non-labored and within target range for patient.. Vitals Time Taken: 9:26 AM, Height: 78 in, Weight: 275 lbs, BMI: 31.8, Temperature: 97.7 F, Pulse: 91 bpm, Respiratory Rate: 18 breaths/min, Blood Pressure: 122/87 mmHg. Cardiovascular 2+ dorsalis pedis/posterior tibialis pulses. Psychiatric pleasant and cooperative. General Notes: Left foot: T  the lateral aspect, fifth met head there is an open wound with granulation tissue, callus And slough. No signs of infection. [name], [name] ([id number]) [id number] Page 8 of 11 Integumentary (Hair, Skin) Wound #3 status is Open. Original cause of wound was Gradually Appeared. The date acquired was: 10/01/2022. The wound has been in treatment 23 weeks. The wound is located on the Left,Plantar Foot. The wound measures 1.8cm length x 1cm width x 0.1cm depth; 1.414cm^2 area and 0.141cm^3 volume. There is Fat Layer (Subcutaneous Tissue) exposed. There is no tunneling or undermining  noted. There is a medium amount of serosanguineous drainage noted. The wound margin is thickened. There is large (67-100%) red granulation within the wound bed. There is no necrotic tissue within the wound bed. The periwound skin appearance exhibited: Callus. The periwound skin appearance did not exhibit: Crepitus, Excoriation, Induration, Rash, Scarring, Dry/Scaly, Maceration, Atrophie Blanche, Cyanosis, Ecchymosis, Hemosiderin Staining, Mottled, Pallor, Rubor, Erythema. Periwound temperature was noted as No Abnormality. 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 I debrided nonviable tissue. I recommended continuing to aggressively offload the area. He is making progress albeit slowly. He is trying desperately to not do the cast. Continue with electric scooter and Vashe wet-to-dry dressings. Procedures Wound #3 Pre-procedure diagnosis of Wound #3 is a Diabetic Wound/Ulcer of the Lower Extremity located on the Left,Plantar Foot .Severity of Tissue Pre Debridement is: Fat layer exposed. There was a Selective/Open Wound Skin/Epidermis Debridement with a total area of 1.41 sq cm performed by Geralyn Corwin, DO. With the following instrument(s): Curette to remove Non-Viable tissue/material. Material removed includes Callus, Slough, and Skin: Epidermis after achieving pain control using Lidocaine 5% topical ointment. No specimens were taken. A time out was conducted at 09:44, prior to the start of the procedure. A Minimum amount of bleeding was controlled with Pressure. The procedure was tolerated well. Post Debridement Measurements: 1.8cm length x 1cm width x 0.1cm depth; 0.141cm^3 volume. Character of Wound/Ulcer Post Debridement requires further debridement. Severity  of Tissue Post Debridement is: Fat layer exposed. Post procedure Diagnosis Wound #3: Same as Pre-Procedure General Notes: Scribed for Dr. Mikey Bussing by Tommie Ard, RN. Plan Follow-up Appointments: Return Appointment in 2 weeks. - Dr. Mikey Bussing Room 9 Thursday Cellular or Tissue Based Products: Cellular or Tissue Based Product Type: - apligraf and grafix 50% coinsurance 02/28/23-apply 1st Grafix 03/07/2023 HOLD THIS WEEK. Bathing/ Shower/ Hygiene: May shower with protection but do not get wound dressing(s) wet. Protect dressing(s) with water repellant cover (for example, large plastic bag) or a cast cover and may then take shower. Off-Loading: Other: - keep pressure off left lateral foot; use peg assist as insert in surgical shoe WOUND #3: - Foot Wound Laterality: Plantar, Left Cleanser: Soap and Water 1 x Per Day/15 Days Discharge Instructions: May shower and wash wound with dial antibacterial soap and water prior to dressing change. Cleanser: Vashe 5.8 (oz) 1 x Per Day/15 Days Discharge Instructions: Cleanse the wound with Vashe prior to applying a clean dressing using gauze sponges, not tissue or cotton balls. Peri-Wound Care: Zinc Oxide Ointment 30g tube 1 x Per Day/15 Days Discharge Instructions: Apply Zinc Oxide to periwound with each dressing change Prim Dressing: 2x2 gauze 1 x Per Day/15 Days ary Discharge Instructions: sock gauze with vashe. Prim Dressing: vashe 1 x Per  Day/15 Days ary Discharge Instructions: damp gauze to wound bed Secondary Dressing: ABD Pad, 5x9 1 x Per Day/15 Days Discharge Instructions: ABD pad donut. Secured With: [employer], 4.5x3.1 (in/yd) 1 x Per Day/15 Days Discharge Instructions: Secure with Kerlix as directed. Secured With: 50M Medipore H Soft Cloth Surgical T ape, 4 x 10 (in/yd) 1 x Per Day/15 Days Discharge Instructions: Secure with tape as directed. 1. In office sharp debridement 2. Vashe wet-to-dry dressing 3. Electric scooter for  offloading 4. Surgical shoe with peg assist 5. Follow-up in 2 weeks Electronic Signature(s) JARQUEZ, MESTRE ([id number]) [id number] Page 9 of 11 Signed: 07/10/2023 11:31:25 AM By: Geralyn Corwin DO Entered By: Geralyn Corwin on 07/10/2023 09:58:01 -------------------------------------------------------------------------------- HxROS Details Patient Name: Date of Service: [name] IN, [name] 07/10/2023 9:30 A M Medical Record Number: [id number] Patient Account Number: [id number] Date of Birth/Sex: Treating RN: 04-10-1971 (52 y.o. M) Primary Care Provider: [name] Other Clinician: Referring Provider: Treating Provider/Extender: [name] in Treatment: 23 Information Obtained From Patient Eyes Medical History: Negative for: Cataracts; Glaucoma; Optic Neuritis Ear/Nose/Mouth/Throat Medical History: Positive for: Chronic sinus problems/congestion Negative for: Middle ear problems Hematologic/Lymphatic Medical History: Negative for: Anemia; Hemophilia; Human Immunodeficiency Virus; Lymphedema; Sickle Cell Disease Respiratory Medical History: Negative for: Aspiration; Asthma; Chronic Obstructive Pulmonary Disease (COPD); Pneumothorax; Sleep Apnea; Tuberculosis Cardiovascular Medical History: Positive for: Congestive Heart Failure; Coronary Artery Disease; Hypertension; Myocardial Infarction - age 89 Negative for: Angina; Arrhythmia; Deep Vein Thrombosis; Hypotension; Peripheral Arterial Disease; Peripheral Venous Disease; Phlebitis; Vasculitis Past Medical History Notes: Ischemic Cardiomyopathy Gastrointestinal Medical History: Negative for: Cirrhosis ; Colitis; Crohns; Hepatitis A; Hepatitis B; Hepatitis C Endocrine Medical History: Positive for: Type II Diabetes Time with diabetes: ten years Treated with: Insulin Blood sugar tested every day: No Genitourinary Medical History: Negative for: End Stage  Renal Disease Past Medical History Notes: Renal Insufficiency Immunological Medical History: Negative for: Lupus Erythematosus; [name] Glenn, [name] ([id number]) [id number] Page 10 of 11 Integumentary (Skin) Medical History: Negative for: History of Burn Musculoskeletal Medical History: Negative for: Gout; Rheumatoid Arthritis; Osteoarthritis; Osteomyelitis Neurologic Medical History: Positive for: Neuropathy Negative for: Dementia; Quadriplegia; Paraplegia; Seizure Disorder Oncologic Medical History: Negative for: Received Chemotherapy; Received Radiation Psychiatric Medical History: Negative for: Anorexia/bulimia; Confinement Anxiety HBO Extended History Items Ear/Nose/Mouth/Throat: Chronic sinus problems/congestion Immunizations Pneumococcal Vaccine: Received Pneumococcal Vaccination: No Implantable Devices None Hospitalization / Surgery History Type of Hospitalization/Surgery Heart Cath in 2018 Family and Social History Cancer: No; Diabetes: No; Heart Disease: Yes - Mother,Father; Hereditary Spherocytosis: No; Hypertension: No; Kidney Disease: No; Lung Disease: No; Seizures: No; Stroke: No; Thyroid Problems: No; Tuberculosis: No; Never smoker; Marital Status - Single; Alcohol Use: Rarely; Drug Use: No History; Caffeine Use: Rarely; Financial Concerns: No; Food, Clothing or Shelter Needs: No; Support System Lacking: No; Transportation Concerns: No Electronic Signature(s) Signed: 07/10/2023 11:31:25 AM By: Geralyn Corwin DO Entered By: Geralyn Corwin on 07/10/2023 09:56:10 -------------------------------------------------------------------------------- SuperBill Details Patient Name: Date of Service: [name] IN, [name] 07/10/2023 Medical Record Number: [id number] Patient Account Number: [id number] Date of Birth/Sex: Treating RN: 1971-01-19 (52 y.o. M) Primary Care Provider: [name] Other Clinician: Referring  Provider: Treating Provider/Extender: [name] in Treatment: 23 Diagnosis Coding ICD-10 Codes Code Description [id number] 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.621 Type 2 diabetes mellitus with foot ulcer Glenn, [name] ([id number]) [id number] Page 11 of [phone] Acquired absence of other left toe(s) Facility Procedures : CPT4 Code:  [id number] Description: [phone] - DEBRIDE WOUND 1ST 20 SQ CM OR < ICD-10 Diagnosis Description L97.522 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.621 Type 2 diabetes mellitus with foot ulcer Z89.422 Acquired absence of other left  toe(s) Modifier: Quantity: 1 Physician Procedures : CPT4 Code Description Modifier [id number] - WC PHYS DEBR WO ANESTH 20 SQ CM ICD-10 Diagnosis Description L97.522 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.621 Type 2 diabetes mellitus with foot ulcer Z89.422  Acquired absence of other left toe(s) Quantity: 1 Electronic Signature(s) Signed: 07/10/2023 11:31:25 AM By: Geralyn Corwin DO Entered By: Geralyn Corwin on 07/10/2023 09:58:13

## 2023-07-11 NOTE — Progress Notes (Signed)
[name], [name] ([id number]) [id number] Page 1 of 7 Visit Report for 07/10/2023 Arrival Information Details Patient Name: Date of Service: [name] [place], [name] RO LD 07/10/2023 9:30 A M Medical Record Number: [id number] Patient Account Number: [id number] Date of Birth/Sex: Treating RN: 25-Sep-1971 (52 y.o. M) Primary Care Colter Magowan: [name] Other Clinician: Referring Wilson Sample: Treating Braydan Marriott/Extender: [name] in Treatment: 23 Visit Information History Since Last Visit Added or deleted any medications: No Patient Arrived: Ambulatory Any new allergies or adverse reactions: No Arrival Time: 09:21 Had a fall or experienced change in No Accompanied By: self activities of daily living that may affect Transfer Assistance: None risk of falls: Patient Identification Verified: Yes Signs or symptoms of abuse/neglect since last visito No Secondary Verification Process Completed: Yes Hospitalized since last visit: No Patient Requires Transmission-Based Precautions: No Implantable device outside of the clinic excluding No Patient Has Alerts: Yes cellular tissue based products placed in the center Patient Alerts: 08/2021 ABI:1.2 TBI 0.79 since last visit: Has Dressing in Place as Prescribed: Yes Pain Present Now: No Electronic Signature(s) Signed: 07/11/2023 1:10:34 PM By: Thayer Dallas Entered By: Thayer Dallas on 07/10/2023 09:23:30 -------------------------------------------------------------------------------- Encounter Discharge Information Details Patient Name: Date of Service: [name] [place], [name] 07/10/2023 9:30 A M Medical Record Number: [id number] Patient Account Number: [id number] Date of Birth/Sex: Treating RN: 12-10-1971 (52 y.o. Valma Cava Primary Care Averiana Clouatre: [name] Other Clinician: Referring Vincenta Steffey: Treating Wave Calzada/Extender: [name] in Treatment: 23 Encounter  Discharge Information Items Post Procedure Vitals Discharge Condition: Stable Temperature (F): 97.7 Ambulatory Status: Ambulatory Pulse (bpm): 91 Discharge Destination: Home Respiratory Rate (breaths/min): 18 Transportation: Private Auto Blood Pressure (mmHg): 122/87 Accompanied By: self Schedule Follow-up Appointment: Yes Clinical Summary of Care: Electronic Signature(s) Signed: 07/10/2023 10:01:03 AM By: Tommie Ard RN Entered By: Tommie Ard on 07/10/2023 10:01:03 [name] ([id number]) [id number] Page 2 of 7 -------------------------------------------------------------------------------- Lower Extremity Assessment Details Patient Name: Date of Service: [name] [place], [name] RO LD 07/10/2023 9:30 A M Medical Record Number: [id number] Patient Account Number: [id number] Date of Birth/Sex: Treating RN: [birth year] (52 y.o. M) Primary Care Izaha Shughart: [name] Other Clinician: Referring Aliya Sol: Treating Lalana Wachter/Extender: [name] in Treatment: 23 Edema Assessment Assessed: [name]: No] [name]: No] Edema: [Left: N] [Right: o] Calf Left: Right: Point of Measurement: 43 cm From Medial Instep 38 cm Ankle Left: Right: Point of Measurement: 9 cm From Medial Instep 23.1 cm Vascular Assessment Extremity colors, hair growth, and conditions: Extremity Color: [Left:Normal] Hair Growth on Extremity: [Left:Yes] Temperature of Extremity: [Left:Warm < 3 seconds] Toe Nail Assessment Left: Right: Thick: No Discolored: No Deformed: No Improper Length and Hygiene: No Electronic Signature(s) Signed: 07/11/2023 1:10:34 PM By: Thayer Dallas Entered By: Thayer Dallas on 07/10/2023 09:32:27 -------------------------------------------------------------------------------- Multi Wound Chart Details Patient Name: Date of Service: [name] [place], [name] 07/10/2023 9:30 A M Medical Record Number: [id number] Patient Account Number:  [id number] Date of Birth/Sex: Treating RN: Dec 27, 1971 (52 y.o. M) Primary Care Zuly Belkin: [name] Other Clinician: Referring Jasmynn Pfalzgraf: Treating Alizandra Loh/Extender: [name] in Treatment: 23 Vital Signs Height(in): 78 Pulse(bpm): 91 Weight(lbs): 275 Blood Pressure(mmHg): 122/87 Body Mass Index(BMI): 31.8 Temperature(F): 97.7 Respiratory Rate(breaths/min): 18 [[name], [name] ([id number]):Photos:] [[id number] Page 3 of 7:3 N/A N/A N/A N/A] Left, Plantar Foot N/A N/A Wound Location: Gradually Appeared N/A N/A Wounding Event: Diabetic Wound/Ulcer of the Lower N/A N/A Primary Etiology: Extremity Chronic sinus problems/congestion, N/A N/A Comorbid History: Congestive Heart Failure, Coronary Artery  Disease, Hypertension, Myocardial Infarction, Type II Diabetes, Neuropathy 10/01/2022 N/A N/A Date Acquired: 41 N/A N/A Weeks of Treatment: Open N/A N/A Wound Status: No N/A N/A Wound Recurrence: Yes N/A N/A Pending [name] on Presentation: 1.8x1x0.1 N/A N/A Measurements L x W x D (cm) 1.414 N/A N/A A (cm) : rea 0.141 N/A N/A Volume (cm) : 64.00% N/A N/A % Reduction in A rea: 82.00% N/A N/A % Reduction in Volume: Grade 1 N/A N/A Classification: Medium N/A N/A Exudate A mount: Serosanguineous N/A N/A Exudate Type: red, brown N/A N/A Exudate Color: Thickened N/A N/A Wound Margin: Large (67-100%) N/A N/A Granulation A mount: Red N/A N/A Granulation Quality: None Present (0%) N/A N/A Necrotic A mount: Fat Layer (Subcutaneous Tissue): Yes N/A N/A Exposed Structures: Fascia: No Tendon: No Muscle: No Joint: No Bone: No Medium (34-66%) N/A N/A Epithelialization: Debridement - Selective/Open Wound N/A N/A Debridement: Pre-procedure Verification/Time Out 09:44 N/A N/A Taken: Lidocaine 5% topical ointment N/A N/A Pain Control: Callus, Slough N/A N/A Tissue Debrided: Skin/Epidermis N/A  N/A Level: 1.41 N/A N/A Debridement A (sq cm): rea Curette N/A N/A Instrument: Minimum N/A N/A Bleeding: Pressure N/A N/A Hemostasis A chieved: Procedure was tolerated well N/A N/A Debridement Treatment Response: 1.8x1x0.1 N/A N/A Post Debridement Measurements L x W x D (cm) 0.141 N/A N/A Post Debridement Volume: (cm) Callus: Yes N/A N/A Periwound Skin Texture: Excoriation: No Induration: No Crepitus: No Rash: No Scarring: No Maceration: No N/A N/A Periwound Skin Moisture: Dry/Scaly: No Atrophie Blanche: No N/A N/A Periwound Skin Color: Cyanosis: No Ecchymosis: No Erythema: No Hemosiderin Staining: No Mottled: No Pallor: No Rubor: No No Abnormality N/A N/A Temperature: Debridement N/A N/A Procedures Performed: Treatment Notes Electronic Signature(s) Signed: 07/10/2023 11:31:25 AM By: Kingsley Plan, [name] ([id number]) [id number] Page 4 of 7 Entered By: Geralyn Corwin on 07/10/2023 09:54:24 -------------------------------------------------------------------------------- Multi-Disciplinary Care Plan Details Patient Name: Date of Service: [name] [place], [name] RO LD 07/10/2023 9:30 A M Medical Record Number: [id number] Patient Account Number: [id number] Date of Birth/Sex: Treating RN: 1971/11/09 (52 y.o. Valma Cava Primary Care Lamesha Tibbits: [name] Other Clinician: Referring Brennon Otterness: Treating Arneisha Kincannon/Extender: [name] in Treatment: 23 Active Inactive Wound/Skin Impairment Nursing Diagnoses: Impaired tissue integrity Knowledge deficit related to ulceration/compromised skin integrity Goals: Patient will have a decrease in wound volume by X% from date: (specify in notes) Date Initiated: 01/24/2023 Target Resolution Date: 08/30/2023 Goal Status: Active Patient/caregiver will verbalize understanding of skin care regimen Date Initiated: 01/24/2023 Target Resolution Date:  08/30/2023 Goal Status: Active Ulcer/skin breakdown will have a volume reduction of 30% by week 4 Date Initiated: 01/24/2023 Date Inactivated: 03/07/2023 Target Resolution Date: 02/27/2023 Goal Status: Unmet Unmet Reason: larger today. Ulcer/skin breakdown will have a volume reduction of 50% by week 8 Date Initiated: 01/24/2023 Target Resolution Date: 08/30/2023 Goal Status: Active Interventions: Assess patient/caregiver ability to obtain necessary supplies Assess patient/caregiver ability to perform ulcer/skin care regimen upon admission and as needed Assess ulceration(s) every visit Notes: Electronic Signature(s) Signed: 07/10/2023 2:48:02 PM By: Tommie Ard RN Entered By: Tommie Ard on 07/10/2023 09:46:42 -------------------------------------------------------------------------------- Pain Assessment Details Patient Name: Date of Service: [name] [place], [name] 07/10/2023 9:30 A M Medical Record Number: [id number] Patient Account Number: [id number] Date of Birth/Sex: Treating RN: 04/13/1971 (52 y.o. M) Primary Care Antawan Mchugh: [name] Other Clinician: Referring Rebbie Lauricella: Treating Naseem Adler/Extender: [name] in Treatment: 23 Active Problems Location of Pain Severity and Description of Pain Patient Has Paino No Site Locations Lindsay,  [name] ([id number]) [id number] Page 5 of 7 Pain Management and Medication Current Pain Management: Electronic Signature(s) Signed: 07/11/2023 1:10:34 PM By: Thayer Dallas Entered By: Thayer Dallas on 07/10/2023 09:27:02 -------------------------------------------------------------------------------- Patient/Caregiver Education Details Patient Name: Date of Service: [name] [place], [name] 7/11/2024andnbsp9:30 A M Medical Record Number: [id number] Patient Account Number: [id number] Date of Birth/Gender: Treating RN: Mar 25, 1971 (52 y.o. Valma Cava Primary Care Physician: [name] Other Clinician: Referring Physician: Treating Physician/Extender: [name] in Treatment: 23 Education Assessment Education Provided To: Patient Education Topics Provided Wound Debridement: Methods: Explain/Verbal Responses: Reinforcements needed, State content correctly Wound/Skin Impairment: Methods: Explain/Verbal Responses: Reinforcements needed, State content correctly Electronic Signature(s) Signed: 07/10/2023 2:48:02 PM By: Tommie Ard RN Entered By: Tommie Ard on 07/10/2023 09:48:28 Wound Assessment Details -------------------------------------------------------------------------------- [name] ([id number]) [id number] Page 6 of 7 Patient Name: Date of Service: [name] [place], [name] RO LD 07/10/2023 9:30 A M Medical Record Number: [id number] Patient Account Number: [id number] Date of Birth/Sex: Treating RN: [birth year] (52 y.o. M) Primary Care Tandy Lewin: [name] Other Clinician: Referring Surya Folden: Treating Lekesha Claw/Extender: [name] in Treatment: 23 Wound Status Wound Number: 3 Primary Diabetic Wound/Ulcer of the Lower Extremity Etiology: Wound Location: Left, Plantar Foot Wound Open Wounding Event: Gradually Appeared Status: Date Acquired: 10/01/2022 Comorbid Chronic sinus problems/congestion, Congestive Heart Failure, Weeks Of Treatment: 23 History: Coronary Artery Disease, Hypertension, Myocardial Infarction, Type Clustered Wound: No II Diabetes, Neuropathy Pending Amputation On Presentation Wound under treatment by Kamora Vossler outside of Wound Center Photos Wound Measurements Length: (cm) 1.8 % Reduction in Area: 64% Width: (cm) 1 % Reduction in Volume: 82% Depth: (cm) 0.1 Epithelialization: Medium (34-66%) Area: (cm) 1.414 Tunneling: No Volume: (cm) 0.141 Undermining: No Wound Description Classification: Grade 1 Foul Odor After Cleansing:  No Wound Margin: Thickened Slough/Fibrino Yes Exudate Amount: Medium Exudate Type: Serosanguineous Exudate Color: red, brown Wound Bed Granulation Amount: Large (67-100%) Exposed Structure Granulation Quality: Red Fascia Exposed: No Necrotic Amount: None Present (0%) Fat Layer (Subcutaneous Tissue) Exposed: Yes Tendon Exposed: No Muscle Exposed: No Joint Exposed: No Bone Exposed: No Periwound Skin Texture Texture Color No Abnormalities Noted: No No Abnormalities Noted: No Callus: Yes Atrophie Blanche: No Crepitus: No Cyanosis: No Excoriation: No Ecchymosis: No Induration: No Erythema: No Rash: No Hemosiderin Staining: No Scarring: No Mottled: No Pallor: No Moisture Rubor: No No Abnormalities Noted: No Dry / Scaly: No Temperature / Pain Maceration: No Temperature: No Abnormality Treatment Notes Wound #3 (Foot) Wound Laterality: Plantar, Left [name], [name] ([id number]) [id number] Page 7 of 7 Soap and Water Discharge Instruction: May shower and wash wound with dial antibacterial soap and water prior to dressing change. Vashe 5.8 (oz) Discharge Instruction: Cleanse the wound with Vashe prior to applying a clean dressing using gauze sponges, not tissue or cotton balls. Peri-Wound Care Zinc Oxide Ointment 30g tube Discharge Instruction: Apply Zinc Oxide to periwound with each dressing change Topical Primary Dressing 2x2 gauze Discharge Instruction: sock gauze with vashe. vashe Discharge Instruction: damp gauze to wound bed Secondary Dressing ABD Pad, 5x9 Discharge Instruction: ABD pad donut. Secured With [employer], 4.5x3.1 (in/yd) Discharge Instruction: Secure with Kerlix as directed. 28M Medipore H Soft Cloth Surgical T ape, 4 x 10 (in/yd) Discharge Instruction: Secure with tape as directed. Compression Wrap Compression Stockings Add-Ons Electronic Signature(s) Signed: 07/11/2023 1:10:34 PM By: Thayer Dallas Entered By: Thayer Dallas on 07/10/2023 09:35:03 -------------------------------------------------------------------------------- Vitals Details Patient Name: Date of Service: [name] [place], [name] 07/10/2023 9:30  A M Medical Record Number: [id number] Patient Account Number: [id number] Date of Birth/Sex: Treating RN: 1971-04-25 (52 y.o. M) Primary Care Jaaliyah Lucatero: [name] Other Clinician: Referring Gaylene Moylan: Treating Donyelle Enyeart/Extender: [name] in Treatment: 23 Vital Signs Time Taken: 09:26 Temperature (F): 97.7 Height (in): 78 Pulse (bpm): 91 Weight (lbs): 275 Respiratory Rate (breaths/min): 18 Body Mass Index (BMI): 31.8 Blood Pressure (mmHg): 122/87 Reference Range: 80 - 120 mg / dl Electronic Signature(s) Signed: 07/11/2023 1:10:34 PM By: Thayer Dallas Entered By: Thayer Dallas on 07/10/2023 09:26:50

## 2023-07-24 ENCOUNTER — Encounter (HOSPITAL_BASED_OUTPATIENT_CLINIC_OR_DEPARTMENT_OTHER): Payer: 59 | Admitting: Internal Medicine

## 2023-07-24 ENCOUNTER — Other Ambulatory Visit: Payer: Self-pay

## 2023-07-24 DIAGNOSIS — E11621 Type 2 diabetes mellitus with foot ulcer: Secondary | ICD-10-CM

## 2023-07-24 DIAGNOSIS — L97522 Non-pressure chronic ulcer of other part of left foot with fat layer exposed: Secondary | ICD-10-CM

## 2023-07-24 DIAGNOSIS — I252 Old myocardial infarction: Secondary | ICD-10-CM | POA: Diagnosis not present

## 2023-07-24 DIAGNOSIS — Z955 Presence of coronary angioplasty implant and graft: Secondary | ICD-10-CM | POA: Diagnosis not present

## 2023-07-24 DIAGNOSIS — E1151 Type 2 diabetes mellitus with diabetic peripheral angiopathy without gangrene: Secondary | ICD-10-CM | POA: Diagnosis not present

## 2023-07-24 DIAGNOSIS — E1122 Type 2 diabetes mellitus with diabetic chronic kidney disease: Secondary | ICD-10-CM | POA: Diagnosis not present

## 2023-07-24 DIAGNOSIS — N189 Chronic kidney disease, unspecified: Secondary | ICD-10-CM | POA: Diagnosis not present

## 2023-07-24 DIAGNOSIS — E785 Hyperlipidemia, unspecified: Secondary | ICD-10-CM | POA: Diagnosis not present

## 2023-07-24 DIAGNOSIS — Z794 Long term (current) use of insulin: Secondary | ICD-10-CM | POA: Diagnosis not present

## 2023-07-24 DIAGNOSIS — I5042 Chronic combined systolic (congestive) and diastolic (congestive) heart failure: Secondary | ICD-10-CM | POA: Diagnosis not present

## 2023-07-24 DIAGNOSIS — Z89422 Acquired absence of other left toe(s): Secondary | ICD-10-CM

## 2023-07-24 DIAGNOSIS — E1140 Type 2 diabetes mellitus with diabetic neuropathy, unspecified: Secondary | ICD-10-CM | POA: Diagnosis not present

## 2023-07-24 DIAGNOSIS — Z7984 Long term (current) use of oral hypoglycemic drugs: Secondary | ICD-10-CM | POA: Diagnosis not present

## 2023-07-24 DIAGNOSIS — I130 Hypertensive heart and chronic kidney disease with heart failure and stage 1 through stage 4 chronic kidney disease, or unspecified chronic kidney disease: Secondary | ICD-10-CM | POA: Diagnosis not present

## 2023-07-24 DIAGNOSIS — I2510 Atherosclerotic heart disease of native coronary artery without angina pectoris: Secondary | ICD-10-CM | POA: Diagnosis not present

## 2023-07-24 MED ORDER — AMOXICILLIN-POT CLAVULANATE 875-125 MG PO TABS
1.0000 | ORAL_TABLET | Freq: Two times a day (BID) | ORAL | 0 refills | Status: DC
  Filled 2023-07-24: qty 14, 7d supply, fill #0

## 2023-07-24 NOTE — Progress Notes (Signed)
ARBIE, BLANKLEY ([id number]) [id number] Page 1 of 10 Visit Report for 07/24/2023 Chief Complaint Document Details Patient Name: Date of Service: [name], [name] RO LD 07/24/2023 8:00 A M Medical Record Number: [id number] Patient Account Number: [id number] Date of Birth/Sex: Treating RN: 16-Mar-1971 (52 y.o. M) Primary Care Provider: [name] Other Clinician: Referring Provider: Treating Provider/Extender: [name] in Treatment: 25 Information Obtained from: Patient Chief Complaint 01/15/2023; left foot wound Electronic Signature(s) Signed: 07/24/2023 12:37:31 PM By: Geralyn Corwin DO Entered By: Geralyn Corwin on 07/24/2023 09:43:43 -------------------------------------------------------------------------------- HPI Details Patient Name: Date of Service: [name], [name] 07/24/2023 8:00 A M Medical Record Number: [id number] Patient Account Number: [id number] Date of Birth/Sex: Treating RN: [birth year] (52 y.o. M) Primary Care Provider: [name] Other Clinician: Referring Provider: Treating Provider/Extender: [name] in Treatment: 25 History of Present Illness HPI Description: ADMISSION 01/11/2020 This is a 52 year old man with uncontrolled type 2 diabetes with a recent hemoglobin A1c earlier this year of 13.4. He is on insulin and glipizide. He does not take his blood sugars at home. He does have a follow-up with primary care later this month I believe on January 27. He tells [place] that roughly a month ago he was walking with a shoe with a hole in his foot. He took the shoe off and there was an open wound at roughly the left fourth met head. This has significant undermining and raised edges. He has not noticed any purulence he does not feel unwell. More recently he was taking skin off the bottom of his foot and has a superficial area on the left fifth met head. He has not been  offloading this. The patient was in the ER on 12/20. They gave him Bactroban which she has been using on the wound and 10 days worth of doxycycline. No x-rays were done. He has not had vascular studies. He is also been using hydrogen peroxide. Past medical history type 2 diabetes uncontrolled, chronic systolic heart failure, coronary artery disease with a history of congestive heart failure with stents. Hypertension hyperlipidemia and chronic renal insufficiency ABI in this clinic was 1.14 on the left. 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 He is uncertain whether he would be able to work if we put him on some form of restriction 1/19; he is generally doing quite well. Using silver alginate on the wounds. Things actually look better. He has a forefoot offloading boot which she seems to be compliant about. He has support at work to stay off his foot is much as possible which is gratifying. Culture I did last week showed a few Enterococcus faecalis. I am going to put him on Augmentin. I talked about ordering an x-ray in my note last week but that does not seem to have happened. We will review reorder the x-ray this week. 1/26; x-ray reordered last week was negative for osteomyelitis. We are using silver alginate on the wound on the third and fifth met heads on the left. He is using a Darco forefoot off loader 2/2; the area on the fifth met head is closed. Third met head is still open with tunneling depth and thick callus. 2/9; the area on the fifth met head remains closed however the third met head again has a small open area on presentation with tunneling in depth and surrounded by thick callus. This looks like a pressure issue. We have been using silver alginate  2/16; the area of the fifth metatarsal head remains closed however the area over the third metatarsal head again is a small open area but with some depth. I do not think this is changed much since last week. He  is using Hydrofera Blue with forefoot off loader. He is not able to use a total contact cast on the left leg ARISTON, GRANDISON ([id number]) [id number] Page 2 of 10 because he needs his left leg at work [employer]]. Fortunately the wound does not look infected. I changed him to endoform today 2/26; the area of the fifth metatarsal head remains closed. The area of the third metatarsal head has an even smaller opening this time. I used endoform on this this looks improved. He is offloading this is much as he can and a forefoot off loader on the left. He cannot have a total contact cast because of work responsibilities 3/5; the area on the fifth metatarsal head remains closed the area on the third metatarsal head is also closed on the left foot. 01/15/2023 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 He has been following for podiatry for several months for this issue. He is currently using wet-to-dry saline dressings. He currently denies signs of infection. Progresse note from 1/17; Patient presents with a 56-month history of nonhealing ulcer to the left foot secondary to diabetes and inability to offload well the area. He has had multiple debridements in the past to his feet bilaterally. He has had resection of the fourth left met head in the fifth toe Secondary to osteomyelitis. We discussed the importance of glycemic control for wound healing. Due to his blood glucose levels being elevated he is at high risk for infection and thus further amputation. He expressed understanding. He states he is supposed to be referred to an  endocrinologist at [facility] however the referral fell through. Unclear what happened. Offered a referral to endocrinology at [facility]. Patient was agreeable.Furthermore we discussed the importance of aggressive offloading for his wound healing. This will be the most difficult part of the treatment plan for the patient to do. We discussed a total contact cast however he has declined that at this time. 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 He is currently using a surgical shoe with a peg assist. It does not fit well so we will give him a new one today. I do not think this is enough offloading. If he is not able to offload this area he will likely end up with a BKA. He is well aware of this. For now I recommended Medihoney and Hydrofera Blue for dressing changes. He will follow-up in our [place] office Since this is closer for him. 1/26; patient presents for follow-up. Last clinic visit I had seen him in [place] and we transferred him to [place] since this is a closer location for him. Progress note above from that visit. Over the past week He has been using Medihoney and Hydrofera Blue to the wound bed. He has been using his surgical shoe with peg assist. He has no issues or complaints today. We  discussed doing the total contact cast and he was agreeable to having this placed at next clinic visit. 2/2; patient presents for follow-up. He has been using Vashe wet-to-dry dressings. Plan is for the total contact cast today. 2/5; patient presents for follow-up. The cast was placed 3 days ago. He tolerated this well however had a lot of drainage. He currently denies signs of infection. 2/9; patient presents for follow-up. At last clinic visit we held off on replacing the cast due to drainage. He has been using Vashe wet-to-dry dressings and he is currently taking the oral antibiotics prescribed without issues. He declines a total  contact cast today. We ran insurance verification for skin substitute and due to cost patient declines having this placed. 2/16; patient presents for follow-up. He has decided not to follow-up with podiatry. He continues to decline the total contact cast. He has been using Vashe wet- to-dry dressings. He has not been wearing his surgical shoe with peg assist. He currently denies signs of infection. 3/1; patient presents for follow-up. He has been doing Vashe wet-to-dry dressings. He has a surgical shoe with peg assist. We discussed potentially doing a skin substitute for which she has been approved for by insurance. He knows the out-of-pocket cost of this and would still like to proceed with having this placed today. He denies the current total contact cast. 3/8; patient presents for follow-up. He states that the skin substitute came off after a few days and he has been using Vashe wet-to-dry dressings. He has mild odor to the wound bed on exam. 3/15; patient presents for follow-up. He has been using Vashe wet-to-dry dressings and started his course of Augmentin. He reports improvement in wound healing. He has no issues or complaints today. 3/21; patient presents for follow-up. He continues to use Vashe wet-to-dry dressings. He has no issues or complaints today. 3/28; patient presents for follow-up. He has been using Vashe wet-to-dry dressings. He has no issues or complaints today. He has been using his peg assist with surgical shoe. 4/11; patient presents for follow-up. He has been using Vashe wet-to-dry dressings. The periwound is macerated. He has been using his peg assist with surgical shoe. He has no issues or complaints today. 4/25; patient presents for follow-up. Has been using Vashe wet-to-dry dressings. He has been using zinc oxide to the periwound. For offloading he has been using his peg assist in surgical shoe. He has no issues or complaints today. 5/9; patient presents for follow-up.  He has been using Vashe wet-to-dry dressings. He has no issues or complaints today. He has a surgical shoe with peg assist for offloading. 5/23; patient presents for follow up. He has been using Vashe to clean the wound bed and collagen and blast X with dressing changes. [place] the beach [place] 2 [place]. Wound is bigger. 5/31; patient presents for follow-up. He has been using Vashe wet-to-dry dressings. Wound is stable but with healthier granulation tissue. Patient would like to hold off on the total contact cast and try a knee scooter for offloading. 6/13; patient presents for follow-up. He has been using Vashe wet-to-dry dressings. He has been using a knee scooter for offloading however states he felt unstable on this and decided to buy an electric scooter. This arrived in the mail today. He would like to hold off on the left total contact cast. Wound is slightly smaller today. 6/27; patient presents for follow-up. He has been using Vashe wet-to-dry dressings. He is using an Art gallery manager for  mobility to help relieve the pressure off the wound bed. Wound is smaller today. He wants to hold off on doing the total contact cast as he is showing signs of improvement with wound healing. 7/11; patient presents for follow-up. He has been using Vashe wet-to-dry dressings and his electric scooter for offloading. Wound is slightly smaller. 7/25; patient presents for follow up. He has been using Vashe wet-to-dry dressings and he reports using his electric scooter for offloading. Wound is larger. Electronic Signature(s) Signed: 07/24/2023 12:37:31 PM By: Geralyn Corwin DO Entered By: Geralyn Corwin on 07/24/2023 09:44:22 [name] ([id number]) [id number] Page 3 of 10 -------------------------------------------------------------------------------- Physical Exam Details Patient Name: Date of Service: [name] [place], [name] RO LD 07/24/2023 8:00 A M Medical Record Number:  [id number] Patient Account Number: [id number] Date of Birth/Sex: Treating RN: 05/12/1971 (52 y.o. M) Primary Care Provider: [name] Other Clinician: Referring Provider: Treating Provider/Extender: [name] in Treatment: 25 Constitutional respirations regular, non-labored and within target range for patient.. Cardiovascular 2+ dorsalis pedis/posterior tibialis pulses. Psychiatric pleasant and cooperative. Notes Left foot: T the lateral aspect, fifth met head there is an open wound with granulation tissue Although not healthy in appearance and callus. Mild erythema and o increased warmth to the periwound. No purulent drainage. Electronic Signature(s) Signed: 07/24/2023 12:37:31 PM By: Geralyn Corwin DO Entered By: Geralyn Corwin on 07/24/2023 09:45:10 -------------------------------------------------------------------------------- Physician Orders Details Patient Name: Date of Service: [name], [name] 07/24/2023 8:00 A M Medical Record Number: [id number] Patient Account Number: [id number] Date of Birth/Sex: Treating RN: 06/07/1971 (52 y.o. [name] Primary Care Provider: [name] Other Clinician: Referring Provider: Treating Provider/Extender: [name] in Treatment: 25 Verbal / Phone Orders: No Diagnosis Coding ICD-10 Coding Code Description [phone] 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.621 Type 2 diabetes mellitus with foot ulcer Z89.422 Acquired absence of other left toe(s) Follow-up Appointments ppointment in 1 week. - Dr Mikey Bussing Room 9 Tuesday 07/29/23 @ 3:30 Return A ppointment in 2 weeks. - Dr. Mikey Bussing Room 9 Thursday - cast change Return A Cellular or Tissue Based Products Cellular or Tissue Based Product Type: - apligraf and grafix 50% coinsurance 02/28/23-apply 1st Grafix 03/07/2023 HOLD THIS WEEK. Bathing/ Shower/ Hygiene May shower  with protection but do not get wound dressing(s) wet. Protect dressing(s) with water repellant cover (for example, large plastic bag) or a cast cover and may then take shower. Off-Loading Other: - keep pressure off left lateral foot; use peg assist as insert in surgical shoe BRANDEN, SHALLENBERGER ([id number]) [id number] Page 4 of 10 Wound Treatment Wound #3 - Foot Wound Laterality: Plantar, Left Cleanser: Soap and Water 1 x Per Day/15 Days Discharge Instructions: May shower and wash wound with dial antibacterial soap and water prior to dressing change. Cleanser: Vashe 5.8 (oz) 1 x Per Day/15 Days Discharge Instructions: Cleanse the wound with Vashe prior to applying a clean dressing using gauze sponges, not tissue or cotton balls. Peri-Wound Care: Zinc Oxide Ointment 30g tube 1 x Per Day/15 Days Discharge Instructions: Apply Zinc Oxide to periwound with each dressing change Prim Dressing: 2x2 gauze 1 x Per Day/15 Days ary Discharge Instructions: sock gauze with vashe. Prim Dressing: vashe 1 x Per Day/15 Days ary Discharge Instructions: damp gauze to wound bed Secondary Dressing: ABD Pad, 5x9 1 x Per Day/15 Days Discharge Instructions: ABD pad donut. Secured With: [employer], 4.5x3.1 (in/yd) 1 x Per Day/15 Days Discharge Instructions:  Secure with Kerlix as directed. Secured With: 26M Medipore H Soft Cloth Surgical T ape, 4 x 10 (in/yd) 1 x Per Day/15 Days Discharge Instructions: Secure with tape as directed. Electronic Signature(s) Signed: 07/24/2023 12:37:31 PM By: Geralyn Corwin DO Entered By: Geralyn Corwin on 07/24/2023 09:45:25 -------------------------------------------------------------------------------- Problem List Details Patient Name: Date of Service: [name], [name] 07/24/2023 8:00 A M Medical Record Number: [id number] Patient Account Number: [id number] Date of Birth/Sex: Treating RN: 02-02-1971 (52 y.o. [name] Primary  Care Provider: [name] Other Clinician: Referring Provider: Treating Provider/Extender: [name] in Treatment: 25 Active Problems ICD-10 Encounter Code Description Active Date MDM Diagnosis [id number] Non-pressure chronic ulcer of other part of left foot with fat layer exposed 01/24/2023 No Yes E11.621 Type 2 diabetes mellitus with foot ulcer 01/24/2023 No Yes Z89.422 Acquired absence of other left toe(s) 01/24/2023 No Yes Inactive Problems Resolved Problems Electronic Signature(s) Signed: 07/24/2023 12:37:31 PM By: Christella Noa ([id number]) [id number] Page 5 of 10 Entered By: Geralyn Corwin on 07/24/2023 09:38:10 -------------------------------------------------------------------------------- Progress Note Details Patient Name: Date of Service: [name] [place], [name] RO LD 07/24/2023 8:00 A M Medical Record Number: [id number] Patient Account Number: [id number] Date of Birth/Sex: Treating RN: [birth year] (52 y.o. M) Primary Care Provider: [name] Other Clinician: Referring Provider: Treating Provider/Extender: [name] in Treatment: 25 Subjective Chief Complaint Information obtained from Patient 01/15/2023; left foot wound History of Present Illness (HPI) ADMISSION 01/11/2020 This is a 52 year old man with uncontrolled type 2 diabetes with a recent hemoglobin A1c earlier this year of 13.4. He is on insulin and glipizide. He does not take his blood sugars at home. He does have a follow-up with primary care later this month I believe on January 27. He tells [place] that roughly a month ago he was walking with a shoe with a hole in his foot. He took the shoe off and there was an open wound at roughly the left fourth met head. This has significant undermining and raised edges. He has not noticed any purulence he does not feel unwell. More recently he was taking skin off the  bottom of his foot and has a superficial area on the left fifth met head. He has not been offloading this. The patient was in the ER on 12/20. They gave him Bactroban which she has been using on the wound and 10 days worth of doxycycline. No x-rays were done. He has not had vascular studies. He is also been using hydrogen peroxide. Past medical history type 2 diabetes uncontrolled, chronic systolic heart failure, coronary artery disease with a history of congestive heart failure with stents. Hypertension hyperlipidemia and chronic renal insufficiency ABI in this clinic was 1.14 on the left. 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 He is uncertain whether he would be able to work if we put him on some form of restriction 1/19; he is generally doing quite well. Using silver alginate on the wounds. Things actually look better. He has a forefoot offloading boot which she seems to be compliant about. He has support at work to stay off his foot is much as possible which is gratifying. Culture I did last week showed a few Enterococcus faecalis. I am going to put him on Augmentin. I talked about ordering an x-ray in my note last week but that does not seem to have happened. We will review reorder the x-ray this week. 1/26;  x-ray reordered last week was negative for osteomyelitis. We are using silver alginate on the wound on the third and fifth met heads on the left. He is using a Darco forefoot off loader 2/2; the area on the fifth met head is closed. Third met head is still open with tunneling depth and thick callus. 2/9; the area on the fifth met head remains closed however the third met head again has a small open area on presentation with tunneling in depth and surrounded by thick callus. This looks like a pressure issue. We have been using silver alginate 2/16; the area of the fifth metatarsal head remains closed however the area over the third metatarsal head again is a  small open area but with some depth. I do not think this is changed much since last week. He is using Hydrofera Blue with forefoot off loader. He is not able to use a total contact cast on the left leg because he needs his left leg at work [employer]]. Fortunately the wound does not look infected. I changed him to endoform today 2/26; the area of the fifth metatarsal head remains closed. The area of the third metatarsal head has an even smaller opening this time. I used endoform on this this looks improved. He is offloading this is much as he can and a forefoot off loader on the left. He cannot have a total contact cast because of work responsibilities 3/5; the area on the fifth metatarsal head remains closed the area on the third metatarsal head is also closed on the left foot. 01/15/2023 Mr. 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 He has been following for podiatry for several months for this issue. He is currently using wet-to-dry saline dressings. He currently denies signs of infection. Progresse note from 1/17; Patient presents with a 28-month history of nonhealing ulcer to the left foot secondary to diabetes and inability to offload well the area. He has had multiple debridements in the past to his feet bilaterally. He has had resection of the fourth left met head in the fifth toe Secondary to osteomyelitis. We discussed the importance of glycemic control for wound healing. Due to his blood glucose levels being elevated he is at high risk for infection and thus further amputation. He expressed understanding. He states he is supposed to be  referred to an endocrinologist at [facility] however the referral fell through. Unclear what happened. Offered a referral to endocrinology at [facility]. Patient was agreeable.Furthermore we discussed the importance of aggressive offloading for his wound healing. This will be the most difficult part of the treatment plan for the patient to do. We discussed a total contact cast however he has declined that at this time. 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 He is currently using a surgical shoe with a peg assist. It does not fit well so we will give him a new one today. I do not think this is enough offloading. If he is not able to offload this area he will likely end up with a BKA. He is well aware of this.  For now I recommended Medihoney and Hydrofera Blue for dressing changes. He will follow-up in our [place] office Since this is closer for him. 1/26; patient presents for follow-up. Last clinic visit I had seen him in [place] and we transferred him to [place] since this is a closer location for him. Progress note above from that visit. Over the past week He has been using Medihoney and Hydrofera Blue to the wound bed. He has been using his surgical shoe with peg assist. He has no issues or complaints today. We discussed doing the total contact cast and he was agreeable to having this placed at next clinic visit. 2/2; patient presents for follow-up. He has been using Vashe wet-to-dry dressings. Plan is for the total contact cast today. GENERAL, WEARING ([id number]) [id number] Page 6 of 10 2/5; patient presents for follow-up. The cast was placed 3 days ago. He tolerated this well however had a lot of drainage. He currently denies signs of infection. 2/9; patient presents for follow-up. At last clinic visit we held off on replacing the cast due to drainage. He has been using Vashe wet-to-dry  dressings and he is currently taking the oral antibiotics prescribed without issues. He declines a total contact cast today. We ran insurance verification for skin substitute and due to cost patient declines having this placed. 2/16; patient presents for follow-up. He has decided not to follow-up with podiatry. He continues to decline the total contact cast. He has been using Vashe wet- to-dry dressings. He has not been wearing his surgical shoe with peg assist. He currently denies signs of infection. 3/1; patient presents for follow-up. He has been doing Vashe wet-to-dry dressings. He has a surgical shoe with peg assist. We discussed potentially doing a skin substitute for which she has been approved for by insurance. He knows the out-of-pocket cost of this and would still like to proceed with having this placed today. He denies the current total contact cast. 3/8; patient presents for follow-up. He states that the skin substitute came off after a few days and he has been using Vashe wet-to-dry dressings. He has mild odor to the wound bed on exam. 3/15; patient presents for follow-up. He has been using Vashe wet-to-dry dressings and started his course of Augmentin. He reports improvement in wound healing. He has no issues or complaints today. 3/21; patient presents for follow-up. He continues to use Vashe wet-to-dry dressings. He has no issues or complaints today. 3/28; patient presents for follow-up. He has been using Vashe wet-to-dry dressings. He has no issues or complaints today. He has been using his peg assist with surgical shoe. 4/11; patient presents for follow-up. He has been using Vashe wet-to-dry dressings. The periwound is macerated. He has been using his peg assist with surgical shoe. He has no issues or complaints today. 4/25; patient presents for follow-up. Has been using Vashe wet-to-dry dressings. He has been using zinc oxide to the periwound. For offloading he has been using his  peg assist in surgical shoe. He has no issues or complaints today. 5/9; patient presents for follow-up. He has been using Vashe wet-to-dry dressings. He has no issues or complaints today. He has a surgical shoe with peg assist for offloading. 5/23; patient presents for follow up. He has been using Vashe to clean the wound bed and collagen and blast X with dressing changes. [place] went [place] the [place] 2 [place]. Wound is bigger. 5/31; patient presents for follow-up. He has been using Vashe wet-to-dry dressings. Wound is stable  but with healthier granulation tissue. Patient would like to hold off on the total contact cast and try a knee scooter for offloading. 6/13; patient presents for follow-up. He has been using Vashe wet-to-dry dressings. He has been using a knee scooter for offloading however states he felt unstable on this and decided to buy an electric scooter. This arrived in the mail today. He would like to hold off on the left total contact cast. Wound is slightly smaller today. 6/27; patient presents for follow-up. He has been using Vashe wet-to-dry dressings. He is using an Art gallery manager for mobility to help relieve the pressure off the wound bed. Wound is smaller today. He wants to hold off on doing the total contact cast as he is showing signs of improvement with wound healing. 7/11; patient presents for follow-up. He has been using Vashe wet-to-dry dressings and his electric scooter for offloading. Wound is slightly smaller. 7/25; patient presents for follow up. He has been using Vashe wet-to-dry dressings and he reports using his electric scooter for offloading. Wound is larger. Patient History Information obtained from Patient. Family History Heart Disease - Mother,Father, No family history of Cancer, Diabetes, Hereditary Spherocytosis, Hypertension, Kidney Disease, Lung Disease, Seizures, Stroke, Thyroid Problems, Tuberculosis. Social History Never smoker, Marital Status - Single,  Alcohol Use - Rarely, Drug Use - No History, Caffeine Use - Rarely. 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 38 Denies history of Angina, Arrhythmia, Deep Vein Thrombosis, Hypotension, Peripheral Arterial Disease, Peripheral Venous Disease, Phlebitis, Vasculitis Gastrointestinal Denies history of Cirrhosis , Colitis, Crohns, Hepatitis A, Hepatitis B, Hepatitis C Endocrine Patient has history of Type II Diabetes Genitourinary Denies history of End Stage Renal Disease Immunological Denies history of Lupus Erythematosus, Raynauds, Scleroderma Integumentary (Skin) Denies history of History of Burn Musculoskeletal Denies history of Gout, Rheumatoid Arthritis, Osteoarthritis, Osteomyelitis Neurologic Patient has history of Neuropathy Denies history of Dementia, Quadriplegia, Paraplegia, Seizure Disorder Oncologic KEMOND, AMORIN ([id number]) [id number] Page 7 of 10 Denies history of Received Chemotherapy, Received Radiation Psychiatric Denies history of Anorexia/bulimia, Confinement Anxiety Hospitalization/Surgery History - Heart Cath in 2018. Medical A Surgical History Notes nd Cardiovascular Ischemic Cardiomyopathy Genitourinary Renal Insufficiency Objective Constitutional respirations regular, non-labored and within target range for patient.. Vitals Time Taken: 8:02 AM, Height: 78 in, Weight: 275 lbs, BMI: 31.8,  Temperature: 98.4 F, Pulse: 89 bpm, Respiratory Rate: 18 breaths/min, Blood Pressure: 107/76 mmHg. Cardiovascular 2+ dorsalis pedis/posterior tibialis pulses. Psychiatric pleasant and cooperative. General Notes: Left foot: T the lateral aspect, fifth met head there is an open wound with granulation tissue Although not healthy in appearance and callus. o Mild erythema and increased warmth to the periwound. No purulent drainage. Integumentary (Hair, Skin) Wound #3 status is Open. Original cause of wound was Gradually Appeared. The date acquired was: 10/01/2022. The wound has been in treatment 25 weeks. The wound is located on the Left,Plantar Foot. The wound measures 2.3cm length x 1.5cm width x 0.5cm depth; 2.71cm^2 area and 1.355cm^3 volume. There is Fat Layer (Subcutaneous Tissue) exposed. There is no tunneling or undermining noted. There is a medium amount of serosanguineous drainage noted. The wound margin is thickened. There is  large (67-100%) red granulation within the wound bed. There is no necrotic tissue within the wound bed. The periwound skin appearance exhibited: Callus. The periwound skin appearance did not exhibit: Crepitus, Excoriation, Induration, Rash, Scarring, Dry/Scaly, Maceration, Atrophie Blanche, Cyanosis, Ecchymosis, Hemosiderin Staining, Mottled, Pallor, Rubor, Erythema. Periwound temperature was noted as No Abnormality. 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 It does not appear that he is able to aggressively offload this area despite using an electric scooter. At this time I recommended trying to do the total contact cast again versus referral back to podiatry. He states he will try the cast again. We will set this up for next clinic visit. In the meantime I recommended Vashe wet-to-dry  dressings and will prescribe an oral antibiotic to address the mild erythema and increased warmth to the periwound. The area does not appear overtly infected however he is at high risk for infection due to his uncontrolled diabetes and thus amputations especially since he has had previous ones to the toes. Plan Follow-up Appointments: Return Appointment in 1 week. - Dr Mikey Bussing Room 9 Tuesday 07/29/23 @ 3:30 Return Appointment in 2 weeks. - Dr. Mikey Bussing Room 9 Thursday - cast change Cellular or Tissue Based Products: Cellular or Tissue Based Product Type: - apligraf and grafix 50% coinsurance 02/28/23-apply 1st Grafix 03/07/2023 HOLD THIS WEEK. Bathing/ Shower/ Hygiene: May shower with protection but do not get wound dressing(s) wet. Protect dressing(s) with water repellant cover (for example, large plastic bag) or a cast cover and may then take shower. Off-Loading: Other: - keep pressure off left lateral foot; use peg assist as insert in surgical shoe WOUND #3: - Foot Wound Laterality: Plantar, Left Cleanser: Soap and Water 1 x Per Day/15 Days Discharge Instructions: May shower and wash wound with dial antibacterial soap and water prior to dressing change. MONTREZ, MARIETTA ([id number]) [id number] Page 8 of 10 Cleanser: Vashe 5.8 (oz) 1 x Per Day/15 Days Discharge Instructions: Cleanse the wound with Vashe prior to applying a clean dressing using gauze sponges, not tissue or cotton balls. Peri-Wound Care: Zinc Oxide Ointment 30g tube 1 x Per Day/15 Days Discharge Instructions: Apply Zinc Oxide to periwound with each dressing change Prim Dressing: 2x2 gauze 1 x Per Day/15 Days ary Discharge Instructions: sock gauze with vashe. Prim Dressing: vashe 1 x Per Day/15 Days ary Discharge Instructions: damp gauze to wound bed Secondary Dressing: ABD Pad, 5x9 1 x Per Day/15 Days Discharge Instructions: ABD pad donut. Secured With: [employer], 4.5x3.1 (in/yd) 1 x  Per Day/15 Days Discharge Instructions: Secure with Kerlix as directed. Secured With: 37M Medipore H Soft Cloth Surgical T ape, 4 x 10 (in/yd) 1 x Per Day/15 Days Discharge Instructions: Secure with tape as directed. 1. Vashe wet-to-dry dressings 2. Augmentin 3. Follow-up next week for cast placement 4. Aggressive offloadingsurgical shoe with peg assist, electric scooter Electronic Signature(s) Signed: 07/24/2023 12:37:31 PM By: Geralyn Corwin DO Entered By: Geralyn Corwin on 07/24/2023 09:49:44 -------------------------------------------------------------------------------- HxROS Details Patient Name: Date of Service: [name] [place], [name] 07/24/2023 8:00 A M Medical Record Number: [id number] Patient Account Number: [id number] Date of Birth/Sex: Treating RN: August 26, 1971 (52 y.o. M) Primary Care Provider: [name] Other Clinician: Referring Provider: Treating Provider/Extender: [name] in Treatment: 25 Information Obtained From Patient Eyes Medical History: Negative for: Cataracts; Glaucoma; Optic  Neuritis Ear/Nose/Mouth/Throat Medical History: Positive for: Chronic sinus problems/congestion Negative for: Middle ear problems Hematologic/Lymphatic Medical History: Negative for: Anemia; Hemophilia; Human Immunodeficiency Virus; Lymphedema; Sickle Cell Disease Respiratory Medical History: Negative for: Aspiration; Asthma; Chronic Obstructive Pulmonary Disease (COPD); Pneumothorax; Sleep Apnea; Tuberculosis Cardiovascular Medical History: Positive for: Congestive Heart Failure; Coronary Artery Disease; Hypertension; Myocardial Infarction - age 75 Negative for: Angina; Arrhythmia; Deep Vein Thrombosis; Hypotension; Peripheral Arterial Disease; Peripheral Venous Disease; Phlebitis; Vasculitis Past Medical History Notes: Ischemic Cardiomyopathy Gastrointestinal Medical HistoryMarland Kitchen NAZAIRE, CORDIAL ([id number])  [id number] Page 9 of 10 Negative for: Cirrhosis ; Colitis; Crohns; Hepatitis A; Hepatitis B; Hepatitis C Endocrine Medical History: Positive for: Type II Diabetes Time with diabetes: ten years Treated with: Insulin Blood sugar tested every day: No Genitourinary Medical History: Negative for: End Stage Renal Disease Past Medical History Notes: Renal Insufficiency Immunological Medical History: Negative for: Lupus Erythematosus; Raynauds; Scleroderma Integumentary (Skin) Medical History: Negative for: History of Burn Musculoskeletal Medical History: Negative for: Gout; Rheumatoid Arthritis; Osteoarthritis; Osteomyelitis Neurologic Medical History: Positive for: Neuropathy Negative for: Dementia; Quadriplegia; Paraplegia; Seizure Disorder Oncologic Medical History: Negative for: Received Chemotherapy; Received Radiation Psychiatric Medical History: Negative for: Anorexia/bulimia; Confinement Anxiety HBO Extended History Items Ear/Nose/Mouth/Throat: Chronic sinus problems/congestion Immunizations Pneumococcal Vaccine: Received Pneumococcal Vaccination: No Implantable Devices None Hospitalization / Surgery History Type of Hospitalization/Surgery Heart Cath in 2018 Family and Social History Cancer: No; Diabetes: No; Heart Disease: Yes - Mother,Father; Hereditary Spherocytosis: No; Hypertension: No; Kidney Disease: No; Lung Disease: No; Seizures: No; Stroke: No; Thyroid Problems: No; Tuberculosis: No; Never smoker; Marital Status - Single; Alcohol Use: Rarely; Drug Use: No History; Caffeine Use: Rarely; Financial Concerns: No; Food, Clothing or Shelter Needs: No; Support System Lacking: No; Transportation Concerns: No Electronic Signature(s) Signed: 07/24/2023 12:37:31 PM By: Geralyn Corwin DO Entered By: Geralyn Corwin on 07/24/2023 09:44:27 [name] ([id number]) [id number] Page 10 of  10 -------------------------------------------------------------------------------- SuperBill Details Patient Name: Date of Service: [name] [place], [name] RO LD 07/24/2023 Medical Record Number: [id number] Patient Account Number: [id number] Date of Birth/Sex: Treating RN: 24-Feb-1971 (52 y.o. [name] Primary Care Provider: [name] Other Clinician: Referring Provider: Treating Provider/Extender: [name] in Treatment: 25 Diagnosis Coding ICD-10 Codes Code Description [id number] 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.621 Type 2 diabetes mellitus with foot ulcer Z89.422 Acquired absence of other left toe(s) Facility Procedures : CPT4 Code: [id number] Description: 99213 - WOUND CARE VISIT-LEV 3 EST PT Modifier: Quantity: 1 Physician Procedures : CPT4 Code Description Modifier [id number] 99214 - WC PHYS LEVEL 4 - EST PT ICD-10 Diagnosis Description L97.522 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.621 Type 2 diabetes mellitus with foot ulcer Z89.422 Acquired  absence of other left toe(s) Quantity: 1 Electronic Signature(s) Signed: 07/24/2023 12:37:31 PM By: Geralyn Corwin DO Entered By: Geralyn Corwin on 07/24/2023 09:49:54

## 2023-07-24 NOTE — Progress Notes (Signed)
[name], [name] ([id number]) [id number] Page 1 of 9 Visit Report for 07/24/2023 Arrival Information Details Patient Name: Date of Service: [name] [place], [name] RO LD 07/24/2023 8:00 A M Medical Record Number: [id number] Patient Account Number: [id number] Date of Birth/Sex: Treating RN: 02-01-1971 (52 y.o. [name] Primary Care Mordechai Matuszak: [name] Other Clinician: Referring Sammie Denner: Treating Thresa Dozier/Extender: [name] in Treatment: 25 Visit Information History Since Last Visit Added or deleted any medications: No Patient Arrived: Ambulatory Any new allergies or adverse reactions: No Arrival Time: 07:58 Had a fall or experienced change in No Accompanied By: self activities of daily living that may affect Transfer Assistance: None risk of falls: Patient Identification Verified: Yes Signs or symptoms of abuse/neglect since No Secondary Verification Process Completed: Yes last visito Patient Requires Transmission-Based Precautions: No Hospitalized since last visit: No Patient Has Alerts: Yes Implantable device outside of the clinic No Patient Alerts: 08/2021 ABI:1.2 TBI 0.79 excluding cellular tissue based products placed in the center since last visit: Has Dressing in Place as Prescribed: Yes Has Footwear/Offloading in Place as Yes Prescribed: Left: Surgical Shoe with Pressure Relief Insole Pain Present Now: No Electronic Signature(s) Signed: 07/24/2023 4:00:15 PM By: Redmond Pulling RN, BSN Entered By: Redmond Pulling on 07/24/2023 08:02:08 -------------------------------------------------------------------------------- Clinic Level of Care Assessment Details Patient Name: Date of Service: [name] [place], [name] 07/24/2023 8:00 A M Medical Record Number: [id number] Patient Account Number: [id number] Date of Birth/Sex: Treating RN: 1971-03-28 (52 y.o. [name] Primary Care Cyrene Gharibian: [name] Other  Clinician: Referring Winfield Caba: Treating Maritsa Hunsucker/Extender: [name] in Treatment: 25 Clinic Level of Care Assessment Items TOOL 4 Quantity Score X- 1 0 Use when only an EandM is performed on FOLLOW-UP visit ASSESSMENTS - Nursing Assessment / Reassessment X- 1 10 Reassessment of Co-morbidities (includes updates in patient status) X- 1 5 Reassessment of Adherence to Treatment Plan ASSESSMENTS - Wound and Skin A ssessment / Reassessment X - Simple Wound Assessment / Reassessment - one wound 1 5 []  - 0 Complex Wound Assessment / Reassessment - multiple wounds []  - 0 Dermatologic / Skin Assessment (not related to wound area) [name], [name] ([id number]) [id number] Page 2 of 9 ASSESSMENTS - Focused Assessment X- 1 5 Circumferential Edema Measurements - multi extremities []  - 0 Nutritional Assessment / Counseling / Intervention []  - 0 Lower Extremity Assessment (monofilament, tuning fork, pulses) []  - 0 Peripheral Arterial Disease Assessment (using hand held doppler) ASSESSMENTS - Ostomy and/or Continence Assessment and Care []  - 0 Incontinence Assessment and Management []  - 0 Ostomy Care Assessment and Management (repouching, etc.) PROCESS - Coordination of Care X - Simple Patient / Family Education for ongoing care 1 15 []  - 0 Complex (extensive) Patient / Family Education for ongoing care X- 1 10 Staff obtains Chiropractor, Records, T Results / Process Orders est []  - 0 Staff telephones HHA, Nursing Homes / Clarify orders / etc []  - 0 Routine Transfer to another Facility (non-emergent condition) []  - 0 Routine Hospital Admission (non-emergent condition) []  - 0 New Admissions / Manufacturing engineer / Ordering NPWT Apligraf, etc. , []  - 0 Emergency Hospital Admission (emergent condition) []  - 0 Simple Discharge Coordination []  - 0 Complex (extensive) Discharge Coordination PROCESS - Special Needs []  -  0 Pediatric / Minor Patient Management []  - 0 Isolation Patient Management []  - 0 Hearing / Language / Visual special needs []  - 0 Assessment of Community assistance (transportation, D/C planning, etc.) []  - 0 Additional assistance /  Altered mentation []  - 0 Support Surface(s) Assessment (bed, cushion, seat, etc.) INTERVENTIONS - Wound Cleansing / Measurement X - Simple Wound Cleansing - one wound 1 5 []  - 0 Complex Wound Cleansing - multiple wounds X- 1 5 Wound Imaging (photographs - any number of wounds) []  - 0 Wound Tracing (instead of photographs) X- 1 5 Simple Wound Measurement - one wound []  - 0 Complex Wound Measurement - multiple wounds INTERVENTIONS - Wound Dressings X - Small Wound Dressing one or multiple wounds 1 10 []  - 0 Medium Wound Dressing one or multiple wounds []  - 0 Large Wound Dressing one or multiple wounds []  - 0 Application of Medications - topical []  - 0 Application of Medications - injection INTERVENTIONS - Miscellaneous []  - 0 External ear exam []  - 0 Specimen Collection (cultures, biopsies, blood, body fluids, etc.) []  - 0 Specimen(s) / Culture(s) sent or taken to Lab for analysis []  - 0 Patient Transfer (multiple staff / Michiel Sites Lift / Similar devices) []  - 0 Simple Staple / Suture removal (25 or less) [name], [name] ([id number]) [id number] Page 3 of 9 []  - 0 Complex Staple / Suture removal (26 or more) []  - 0 Hypo / Hyperglycemic Management (close monitor of Blood Glucose) []  - 0 Ankle / Brachial Index (ABI) - do not check if billed separately X- 1 5 Vital Signs Has the patient been seen at the hospital within the last three years: Yes Total Score: 80 Level Of Care: New/Established - Level 3 Electronic Signature(s) Signed: 07/24/2023 4:00:15 PM By: Redmond Pulling RN, BSN Entered By: Redmond Pulling on 07/24/2023  09:14:45 -------------------------------------------------------------------------------- Encounter Discharge Information Details Patient Name: Date of Service: [name] [place], [name] 07/24/2023 8:00 A M Medical Record Number: [id number] Patient Account Number: [id number] Date of Birth/Sex: Treating RN: 07/16/1971 (52 y.o. [name] Primary Care Willia Lampert: [name] Other Clinician: Referring Arick Mareno: Treating Paddy Walthall/Extender: [name] in Treatment: 25 Encounter Discharge Information Items Discharge Condition: Stable Ambulatory Status: Ambulatory Discharge Destination: Home Transportation: Private Auto Accompanied By: self Schedule Follow-up Appointment: Yes Clinical Summary of Care: Patient Declined Electronic Signature(s) Signed: 07/24/2023 4:00:15 PM By: Redmond Pulling RN, BSN Entered By: Redmond Pulling on 07/24/2023 09:15:28 -------------------------------------------------------------------------------- Lower Extremity Assessment Details Patient Name: Date of Service: [name] [place], [name] 07/24/2023 8:00 A M Medical Record Number: [id number] Patient Account Number: [id number] Date of Birth/Sex: Treating RN: 06/19/1971 (52 y.o. [name] Primary Care Iven Earnhart: [name] Other Clinician: Referring Bienvenido Proehl: Treating Loriene Taunton/Extender: [name] in Treatment: 25 Edema Assessment Assessed: [name]: No] [name]: No] Edema: [Left: N] [Right: o] Calf Left: Right: Point of Measurement: 43 cm From Medial Instep 38.4 cm Ankle Left: Right: [name], [name] ([id number]) [id number] Page 4 of 9 Point of Measurement: 9 cm From Medial Instep 23.5 cm Vascular Assessment Pulses: Dorsalis Pedis Palpable: [Left:Yes] Extremity colors, hair growth, and conditions: Extremity Color: [Left:Normal] Hair Growth on Extremity: [Left:Yes] Temperature of Extremity: [Left:Warm < 3  seconds] Toe Nail Assessment Left: Right: Thick: No Discolored: No Deformed: No Improper Length and Hygiene: No Electronic Signature(s) Signed: 07/24/2023 4:00:15 PM By: Redmond Pulling RN, BSN Entered By: Redmond Pulling on 07/24/2023 08:05:03 -------------------------------------------------------------------------------- Multi Wound Chart Details Patient Name: Date of Service: [name] [place], [name] 07/24/2023 8:00 A M Medical Record Number: [id number] Patient Account Number: [id number] Date of Birth/Sex: Treating RN: 09/27/1971 (52 y.o. M) Primary Care Elbia Paro: [name] Other Clinician: Referring Yuriel Lopezmartinez: Treating Arthur Aydelotte/Extender: Myna Bright  Weeks in Treatment: 25 Vital Signs Height(in): 78 Pulse(bpm): 89 Weight(lbs): 275 Blood Pressure(mmHg): 107/76 Body Mass Index(BMI): 31.8 Temperature(F): 98.4 Respiratory Rate(breaths/min): 18 [3:Photos:] [N/A:N/A] Left, Plantar Foot N/A N/A Wound Location: Gradually Appeared N/A N/A Wounding Event: Diabetic Wound/Ulcer of the Lower N/A N/A Primary Etiology: Extremity Chronic sinus problems/congestion, N/A N/A Comorbid History: Congestive Heart Failure, Coronary Artery Disease, Hypertension, Myocardial Infarction, Type II Diabetes, Neuropathy 10/01/2022 N/A N/A Date Acquired: 25 N/A N/A Weeks of Treatment: Open N/A N/A Wound Status: No N/A N/A Wound Recurrence: Yes N/A N/A Pending [name] on Presentation: 2.3x1.5x0.5 N/A N/A Measurements L x W x D (cm) [name], [name] ([id number]) [id number] Page 5 of 9 2.71 N/A N/A A (cm) : rea 1.355 N/A N/A Volume (cm) : 31.00% N/A N/A % Reduction in A rea: -72.60% N/A N/A % Reduction in Volume: Grade 1 N/A N/A Classification: Medium N/A N/A Exudate A mount: Serosanguineous N/A N/A Exudate Type: red, brown N/A N/A Exudate Color: Thickened N/A N/A Wound Margin: Large (67-100%) N/A N/A Granulation A  mount: Red N/A N/A Granulation Quality: None Present (0%) N/A N/A Necrotic A mount: Fat Layer (Subcutaneous Tissue): Yes N/A N/A Exposed Structures: Fascia: No Tendon: No Muscle: No Joint: No Bone: No Medium (34-66%) N/A N/A Epithelialization: Callus: Yes N/A N/A Periwound Skin Texture: Excoriation: No Induration: No Crepitus: No Rash: No Scarring: No Maceration: No N/A N/A Periwound Skin Moisture: Dry/Scaly: No Atrophie Blanche: No N/A N/A Periwound Skin Color: Cyanosis: No Ecchymosis: No Erythema: No Hemosiderin Staining: No Mottled: No Pallor: No Rubor: No No Abnormality N/A N/A Temperature: Treatment Notes Wound #3 (Foot) Wound Laterality: Plantar, Left Cleanser Soap and Water Discharge Instruction: May shower and wash wound with dial antibacterial soap and water prior to dressing change. Vashe 5.8 (oz) Discharge Instruction: Cleanse the wound with Vashe prior to applying a clean dressing using gauze sponges, not tissue or cotton balls. Peri-Wound Care Zinc Oxide Ointment 30g tube Discharge Instruction: Apply Zinc Oxide to periwound with each dressing change Topical Primary Dressing 2x2 gauze Discharge Instruction: sock gauze with vashe. vashe Discharge Instruction: damp gauze to wound bed Secondary Dressing ABD Pad, 5x9 Discharge Instruction: ABD pad donut. Secured With [employer], 4.5x3.1 (in/yd) Discharge Instruction: Secure with Kerlix as directed. 54M Medipore H Soft Cloth Surgical T ape, 4 x 10 (in/yd) Discharge Instruction: Secure with tape as directed. Compression Wrap Compression Stockings Add-Ons Electronic Signature(s) Signed: 07/24/2023 12:37:31 PM By: Christella Noa ([id number]) [id number] Page 6 of 9 Entered By: Geralyn Corwin on 07/24/2023 09:43:19 -------------------------------------------------------------------------------- Multi-Disciplinary Care Plan Details Patient  Name: Date of Service: [name] [place], [name] RO LD 07/24/2023 8:00 A M Medical Record Number: [id number] Patient Account Number: [id number] Date of Birth/Sex: Treating RN: [birth year] (52 y.o. [name] Primary Care Alyona Romack: [name] Other Clinician: Referring Niasia Lanphear: Treating Alakai Macbride/Extender: [name] in Treatment: 25 Active Inactive Wound/Skin Impairment Nursing Diagnoses: Impaired tissue integrity Knowledge deficit related to ulceration/compromised skin integrity Goals: Patient will have a decrease in wound volume by X% from date: (specify in notes) Date Initiated: 01/24/2023 Target Resolution Date: 08/30/2023 Goal Status: Active Patient/caregiver will verbalize understanding of skin care regimen Date Initiated: 01/24/2023 Target Resolution Date: 08/30/2023 Goal Status: Active Ulcer/skin breakdown will have a volume reduction of 30% by week 4 Date Initiated: 01/24/2023 Date Inactivated: 03/07/2023 Target Resolution Date: 02/27/2023 Goal Status: Unmet Unmet Reason: larger today. Ulcer/skin breakdown will have a volume reduction of 50% by week 8 Date Initiated: 01/24/2023 Target Resolution Date:  08/30/2023 Goal Status: Active Interventions: Assess patient/caregiver ability to obtain necessary supplies Assess patient/caregiver ability to perform ulcer/skin care regimen upon admission and as needed Assess ulceration(s) every visit Notes: Electronic Signature(s) Signed: 07/24/2023 4:00:15 PM By: Redmond Pulling RN, BSN Entered By: Redmond Pulling on 07/24/2023 08:18:47 -------------------------------------------------------------------------------- Pain Assessment Details Patient Name: Date of Service: [name] [place], [name] 07/24/2023 8:00 A M Medical Record Number: [id number] Patient Account Number: [id number] Date of Birth/Sex: Treating RN: 05-02-1971 (52 y.o. [name] Primary Care Amelio Brosky: [name] Other Clinician: Referring  Donette Mainwaring: Treating Katy Brickell/Extender: [name] in Treatment: 25 Active Problems Location of Pain Severity and Description of Pain Patient Has Paino No Site Locations [place], [place] ([id number]) [id number] Page 7 of 9 Pain Management and Medication Current Pain Management: Electronic Signature(s) Signed: 07/24/2023 4:00:15 PM By: Redmond Pulling RN, BSN Entered By: Redmond Pulling on 07/24/2023 08:02:44 -------------------------------------------------------------------------------- Patient/Caregiver Education Details Patient Name: Date of Service: [name] [place], [name] 7/25/2024andnbsp8:00 A M Medical Record Number: [id number] Patient Account Number: [id number] Date of Birth/Gender: Treating RN: [birth year] (52 y.o. [name] Primary Care Physician: [name] Other Clinician: Referring Physician: Treating Physician/Extender: [name] in Treatment: 25 Education Assessment Education Provided To: Patient Education Topics Provided Wound/Skin Impairment: Methods: Explain/Verbal Responses: State content correctly [employer]) Signed: 07/24/2023 4:00:15 PM By: Redmond Pulling RN, BSN Entered By: Redmond Pulling on 07/24/2023 08:19:01 -------------------------------------------------------------------------------- Wound Assessment Details Patient Name: Date of Service: [name] [place], [name] 07/24/2023 8:00 A M Medical Record Number: [id number] Patient Account Number: [id number] Date of Birth/Sex: Treating RN: 09-29-1971 (52 y.o. [name] Primary Care Kirin Pastorino: [name] Other Clinician: JOSEL, [name] ([id number]) [id number] Page 8 of 9 Referring Delsa Walder: Treating Shela Esses/Extender: [name] in Treatment: 25 Wound Status Wound Number: 3 Primary Diabetic Wound/Ulcer of the Lower  Extremity Etiology: Wound Location: Left, Plantar Foot Wound Open Wounding Event: Gradually Appeared Status: Date Acquired: 10/01/2022 Comorbid Chronic sinus problems/congestion, Congestive Heart Failure, Weeks Of Treatment: 25 History: Coronary Artery Disease, Hypertension, Myocardial Infarction, Type Clustered Wound: No II Diabetes, Neuropathy Pending Amputation On Presentation Wound under treatment by [facility] Longley outside of Wound Center Photos Wound Measurements Length: (cm) 2.3 Width: (cm) 1.5 Depth: (cm) 0.5 Area: (cm) 2.71 Volume: (cm) 1.355 % Reduction in Area: 31% % Reduction in Volume: -72.6% Epithelialization: Medium (34-66%) Tunneling: No Undermining: No Wound Description Classification: Grade 1 Wound Margin: Thickened Exudate Amount: Medium Exudate Type: Serosanguineous Exudate Color: red, brown Foul Odor After Cleansing: No Slough/Fibrino Yes Wound Bed Granulation Amount: Large (67-100%) Exposed Structure Granulation Quality: Red Fascia Exposed: No Necrotic Amount: None Present (0%) Fat Layer (Subcutaneous Tissue) Exposed: Yes Tendon Exposed: No Muscle Exposed: No Joint Exposed: No Bone Exposed: No Periwound Skin Texture Texture Color No Abnormalities Noted: No No Abnormalities Noted: No Callus: Yes Atrophie Blanche: No Crepitus: No Cyanosis: No Excoriation: No Ecchymosis: No Induration: No Erythema: No Rash: No Hemosiderin Staining: No Scarring: No Mottled: No Pallor: No Moisture Rubor: No No Abnormalities Noted: No Dry / Scaly: No Temperature / Pain Maceration: No Temperature: No Abnormality Treatment Notes Wound #3 (Foot) Wound Laterality: Plantar, Left Cleanser Soap and Water Discharge Instruction: May shower and wash wound with dial antibacterial soap and water prior to dressing change. Vashe 5.8 (oz) Discharge Instruction: Cleanse the wound with Vashe prior to applying a clean dressing using gauze sponges, not tissue or cotton  balls. [name], [name] ([id number]) [id number] Page 9 of 9 Peri-Wound Care  Zinc Oxide Ointment 30g tube Discharge Instruction: Apply Zinc Oxide to periwound with each dressing change Topical Primary Dressing 2x2 gauze Discharge Instruction: sock gauze with vashe. vashe Discharge Instruction: damp gauze to wound bed Secondary Dressing ABD Pad, 5x9 Discharge Instruction: ABD pad donut. Secured With [employer], 4.5x3.1 (in/yd) Discharge Instruction: Secure with Kerlix as directed. 46M Medipore H Soft Cloth Surgical T ape, 4 x 10 (in/yd) Discharge Instruction: Secure with tape as directed. Compression Wrap Compression Stockings Add-Ons Electronic Signature(s) Signed: 07/24/2023 4:00:15 PM By: Redmond Pulling RN, BSN Entered By: Redmond Pulling on 07/24/2023 08:11:24 -------------------------------------------------------------------------------- Vitals Details Patient Name: Date of Service: [name] [place], [name] 07/24/2023 8:00 A M Medical Record Number: [id number] Patient Account Number: [id number] Date of Birth/Sex: Treating RN: [birth year] (52 y.o. [name] Primary Care Kiing Deakin: [name] Other Clinician: Referring Andersen Mckiver: Treating Aadi Bordner/Extender: [name] in Treatment: 25 Vital Signs Time Taken: 08:02 Temperature (F): 98.4 Height (in): 78 Pulse (bpm): 89 Weight (lbs): 275 Respiratory Rate (breaths/min): 18 Body Mass Index (BMI): 31.8 Blood Pressure (mmHg): 107/76 Reference Range: 80 - 120 mg / dl Electronic Signature(s) Signed: 07/24/2023 4:00:15 PM By: Redmond Pulling RN, BSN Entered By: Redmond Pulling on 07/24/2023 08:02:38

## 2023-07-28 ENCOUNTER — Other Ambulatory Visit: Payer: Self-pay

## 2023-07-29 ENCOUNTER — Encounter (HOSPITAL_BASED_OUTPATIENT_CLINIC_OR_DEPARTMENT_OTHER): Payer: 59 | Admitting: Internal Medicine

## 2023-07-29 DIAGNOSIS — I252 Old myocardial infarction: Secondary | ICD-10-CM | POA: Diagnosis not present

## 2023-07-29 DIAGNOSIS — L97522 Non-pressure chronic ulcer of other part of left foot with fat layer exposed: Secondary | ICD-10-CM

## 2023-07-29 DIAGNOSIS — Z7984 Long term (current) use of oral hypoglycemic drugs: Secondary | ICD-10-CM | POA: Diagnosis not present

## 2023-07-29 DIAGNOSIS — E11621 Type 2 diabetes mellitus with foot ulcer: Secondary | ICD-10-CM

## 2023-07-29 DIAGNOSIS — I2510 Atherosclerotic heart disease of native coronary artery without angina pectoris: Secondary | ICD-10-CM | POA: Diagnosis not present

## 2023-07-29 DIAGNOSIS — E1140 Type 2 diabetes mellitus with diabetic neuropathy, unspecified: Secondary | ICD-10-CM | POA: Diagnosis not present

## 2023-07-29 DIAGNOSIS — Z955 Presence of coronary angioplasty implant and graft: Secondary | ICD-10-CM | POA: Diagnosis not present

## 2023-07-29 DIAGNOSIS — I130 Hypertensive heart and chronic kidney disease with heart failure and stage 1 through stage 4 chronic kidney disease, or unspecified chronic kidney disease: Secondary | ICD-10-CM | POA: Diagnosis not present

## 2023-07-29 DIAGNOSIS — E785 Hyperlipidemia, unspecified: Secondary | ICD-10-CM | POA: Diagnosis not present

## 2023-07-29 DIAGNOSIS — I5042 Chronic combined systolic (congestive) and diastolic (congestive) heart failure: Secondary | ICD-10-CM | POA: Diagnosis not present

## 2023-07-29 DIAGNOSIS — Z794 Long term (current) use of insulin: Secondary | ICD-10-CM | POA: Diagnosis not present

## 2023-07-29 DIAGNOSIS — Z89422 Acquired absence of other left toe(s): Secondary | ICD-10-CM | POA: Diagnosis not present

## 2023-07-29 DIAGNOSIS — E1122 Type 2 diabetes mellitus with diabetic chronic kidney disease: Secondary | ICD-10-CM | POA: Diagnosis not present

## 2023-07-29 DIAGNOSIS — E1151 Type 2 diabetes mellitus with diabetic peripheral angiopathy without gangrene: Secondary | ICD-10-CM | POA: Diagnosis not present

## 2023-07-29 DIAGNOSIS — N189 Chronic kidney disease, unspecified: Secondary | ICD-10-CM | POA: Diagnosis not present

## 2023-07-29 NOTE — Progress Notes (Signed)
RAIHAN, DERAGON ([id number]) [id number] Page 1 of 8 Visit Report for 07/29/2023 Arrival Information Details Patient Name: Date of Service: [name] [name], [name] RO LD 07/29/2023 3:45 PM Medical Record Number: [id number] Patient Account Number: [id number] Date of Birth/Sex: Treating RN: 1971-08-04 (52 y.o. M) Primary Care Aerica Rincon: [name] Other Clinician: Referring [name]: Treating [name]/Extender: [name] in Treatment: 26 Visit Information History Since Last Visit All ordered tests and consults were completed: Yes Patient Arrived: Ambulatory Added or deleted any medications: No Arrival Time: 15:08 Any new allergies or adverse reactions: No Accompanied By: None Had a fall or experienced change in No Transfer Assistance: None activities of daily living that may affect Patient Identification Verified: Yes risk of falls: Secondary Verification Process Completed: Yes Signs or symptoms of abuse/neglect since last visito No Patient Requires Transmission-Based Precautions: No Hospitalized since last visit: No Patient Has Alerts: Yes Implantable device outside of the clinic excluding No Patient Alerts: 08/2021 ABI:1.2 TBI 0.79 cellular tissue based products placed in the center since last visit: Pain Present Now: No Electronic Signature(s) Signed: 07/29/2023 3:12:28 PM By: [name] EMT Entered By: [name] on 07/29/2023 15:12:28 -------------------------------------------------------------------------------- Encounter Discharge Information Details Patient Name: Date of Service: [name] [place], [name] 07/29/2023 3:45 PM Medical Record Number: [id number] Patient Account Number: [id number] Date of Birth/Sex: Treating RN: 1971/11/09 (52 y.o. [name] Primary Care [name]: [name] Other Clinician: Referring Kennadie Brenner: Treating [name]/Extender: [name] in Treatment:  26 Encounter Discharge Information Items Post Procedure Vitals Discharge Condition: Stable Temperature (F): 98.3 Ambulatory Status: Ambulatory Pulse (bpm): 83 Discharge Destination: Home Respiratory Rate (breaths/min): 18 Transportation: Private Auto Blood Pressure (mmHg): 119/85 Accompanied By: self Schedule Follow-up Appointment: Yes Clinical Summary of Care: Electronic Signature(s) Unsigned Entered ByShawn [name] on 07/29/2023 16:11:02 Signature(s): [name], [name] ([id number]) [phone] Date(s): [id number] Page 2 of 8 -------------------------------------------------------------------------------- Lower Extremity Assessment Details Patient Name: Date of Service: [name] [place], [name] RO LD 07/29/2023 3:45 PM Medical Record Number: [id number] Patient Account Number: [id number] Date of Birth/Sex: Treating RN: 21-Apr-1971 (52 y.o. [name] Primary Care [name]: [name] Other Clinician: Referring [name]: Treating [name]/Extender: [name] in Treatment: 26 Edema Assessment Assessed: [name]: Yes] [name]: No] Edema: [Left: N] [Right: o] Calf Left: Right: Point of Measurement: 43 cm From Medial Instep 38 cm Ankle Left: Right: Point of Measurement: 9 cm From Medial Instep 23 cm Vascular Assessment Pulses: Dorsalis Pedis Palpable: [Left:Yes] Extremity colors, hair growth, and conditions: Extremity Color: [Left:Normal] Hair Growth on Extremity: [Left:Yes] Temperature of Extremity: [Left:Warm] Capillary Refill: [Left:< 3 seconds] Dependent Rubor: [Left:No] Blanched when Elevated: [Left:No No] Toe Nail Assessment Left: Right: Thick: No Discolored: No Deformed: No Improper Length and Hygiene: No Electronic Signature(s) Unsigned Entered ByShawn [name] on 07/29/2023 15:40:13 -------------------------------------------------------------------------------- Multi Wound Chart Details Patient Name: Date of  ServiceBruce [name] [place], [name] RO LD 07/29/2023 3:45 PM Medical Record Number: [id number] Patient Account Number: [id number] Date of Birth/Sex: Treating RN: July 20, 1971 (52 y.o. M) Primary Care [name]: [name] Other Clinician: Referring [name]: Treating [name]/Extender: [name] in Treatment: 26 Vital Signs Height(in): 78 Pulse(bpm): 83 Weight(lbs): 275 Blood Pressure(mmHg): 119/85 [name], [name] ([id number]) [id number] Page 3 of 8 Body Mass Index(BMI): 31.8 Temperature(F): 98.3 Respiratory Rate(breaths/min): 18 [3:Photos:] [N/A:N/A] Left, Plantar Foot N/A N/A Wound Location: Gradually Appeared N/A N/A Wounding Event: Diabetic Wound/Ulcer of the Lower N/A N/A Primary Etiology: Extremity Chronic sinus problems/congestion, N/A N/A Comorbid History: Congestive Heart  Failure, Coronary Artery Disease, Hypertension, Myocardial Infarction, Type II Diabetes, Neuropathy 10/01/2022 N/A N/A Date Acquired: 68 N/A N/A Weeks of Treatment: Open N/A N/A Wound Status: No N/A N/A Wound Recurrence: Yes N/A N/A Pending [name] on Presentation: 2.5x1.5x0.4 N/A N/A Measurements L x W x D (cm) 2.945 N/A N/A A (cm) : rea 1.178 N/A N/A Volume (cm) : 25.00% N/A N/A % Reduction in A rea: -50.10% N/A N/A % Reduction in Volume: Grade 1 N/A N/A Classification: Medium N/A N/A Exudate A mount: Serosanguineous N/A N/A Exudate Type: red, brown N/A N/A Exudate Color: Thickened N/A N/A Wound Margin: Large (67-100%) N/A N/A Granulation A mount: Red N/A N/A Granulation Quality: None Present (0%) N/A N/A Necrotic A mount: Fat Layer (Subcutaneous Tissue): Yes N/A N/A Exposed Structures: Fascia: No Tendon: No Muscle: No Joint: No Bone: No Medium (34-66%) N/A N/A Epithelialization: Debridement - Excisional N/A N/A Debridement: Pre-procedure Verification/Time Out 16:00 N/A N/A Taken: Lidocaine 4% T opical  Solution N/A N/A Pain Control: Cartilage, Subcutaneous, Slough N/A N/A Tissue Debrided: Skin/Subcutaneous N/A N/A Level: Tissue/Muscle/Bone 2.94 N/A N/A Debridement A (sq cm): rea Curette N/A N/A Instrument: Minimum N/A N/A Bleeding: Pressure N/A N/A Hemostasis Achieved: 0 N/A N/A Procedural Pain: 0 N/A N/A Post Procedural Pain: Debridement Treatment Response: Procedure was tolerated well N/A N/A Post Debridement Measurements L x 2.5x1.5x0.4 N/A N/A W x D (cm) 1.178 N/A N/A Post Debridement Volume: (cm) Callus: Yes N/A N/A Periwound Skin Texture: Excoriation: No Induration: No Crepitus: No Rash: No Scarring: No Maceration: No N/A N/A Periwound Skin Moisture: Dry/Scaly: No Atrophie Blanche: No N/A N/A Periwound Skin Color: Cyanosis: No Ecchymosis: No Erythema: No Hemosiderin Staining: No Mottled: No Pallor: No Rubor: No No Abnormality N/A N/A TemperatureSTEPEHEN, [name] ([id number]) [id number] Page 4 of 8 Debridement N/A N/A Procedures Performed: T Contact Cast otal Treatment Notes Wound #3 (Foot) Wound Laterality: Plantar, Left Cleanser Soap and Water Discharge Instruction: May shower and wash wound with dial antibacterial soap and water prior to dressing change. Vashe 5.8 (oz) Discharge Instruction: Cleanse the wound with Vashe prior to applying a clean dressing using gauze sponges, not tissue or cotton balls. Peri-Wound Care Topical Gentamicin Discharge Instruction: As directed by physician Mupirocin Ointment Discharge Instruction: Apply Mupirocin (Bactroban) as instructed Primary Dressing Hydrofera Blue Ready Transfer Foam, 2.5x2.5 (in/in) Discharge Instruction: Apply directly to wound bed as directed Secondary Dressing ABD Pad, 5x9 Drawtex 4x4 in Discharge Instruction: Apply over primary dressing as directed. Zetuvit Plus 4x8 in Discharge Instruction: Apply over primary dressing as directed. Secured With [employer], 4.5x3.1 (in/yd) Discharge Instruction: Secure with Kerlix as directed. 60M Medipore H Soft Cloth Surgical T ape, 4 x 10 (in/yd) Discharge Instruction: Secure with tape as directed. T contact cast otal Discharge Instruction: size 3 Compression Wrap Compression Stockings Add-Ons Electronic Signature(s) Unsigned Entered By: Geralyn Corwin on 07/29/2023 16:14:29 -------------------------------------------------------------------------------- Multi-Disciplinary Care Plan Details Patient Name: Date of Service: [name] [place], [name] RO LD 07/29/2023 3:45 PM Medical Record Number: [id number] Patient Account Number: [id number] Date of Birth/Sex: Treating RN: [birth year] (52 y.o. [name] Primary Care Kobee Medlen: [name] Other Clinician: Referring Yostin Malacara: Treating Genee Rann/Extender: [name] in Treatment: [address] Mignon, [name] ([id number]) [id number] Page 5 of 8 Wound/Skin Impairment Nursing Diagnoses: Impaired tissue integrity Knowledge deficit related to ulceration/compromised skin integrity Goals: Patient will have a decrease in wound volume by X% from date: (specify in notes) Date Initiated: 01/24/2023 Target Resolution Date: 08/30/2023 Goal Status: Active Patient/caregiver will  verbalize understanding of skin care regimen Date Initiated: 01/24/2023 Target Resolution Date: 08/30/2023 Goal Status: Active Ulcer/skin breakdown will have a volume reduction of 30% by week 4 Date Initiated: 01/24/2023 Date Inactivated: 03/07/2023 Target Resolution Date: 02/27/2023 Goal Status: Unmet Unmet Reason: larger today. Ulcer/skin breakdown will have a volume reduction of 50% by week 8 Date Initiated: 01/24/2023 Target Resolution Date: 08/30/2023 Goal Status: Active Interventions: Assess patient/caregiver ability to obtain necessary supplies Assess patient/caregiver ability to perform ulcer/skin care  regimen upon admission and as needed Assess ulceration(s) every visit Notes: Electronic Signature(s) Unsigned Entered By: [name] on 07/29/2023 16:08:24 -------------------------------------------------------------------------------- Pain Assessment Details Patient Name: Date of ServiceBruce [name] [place], [name] RO LD 07/29/2023 3:45 PM Medical Record Number: [id number] Patient Account Number: [id number] Date of Birth/Sex: Treating RN: 10-Jan-1971 (52 y.o. [name] Primary Care Lissy Deuser: [name] Other Clinician: Referring Seidy Labreck: Treating Fulton Merry/Extender: [name] in Treatment: 26 Active Problems Location of Pain Severity and Description of Pain Patient Has Paino No Site Locations Pain Management and Medication Current Pain Management: [name], [name] ([id number]) [id number] Page 6 of 8 Electronic Signature(s) Unsigned Entered By: [name] on 07/29/2023 15:39:48 -------------------------------------------------------------------------------- Patient/Caregiver Education Details Patient Name: Date of Service: [name] [place], [name] RO LD 7/30/2024andnbsp3:45 PM Medical Record Number: [id number] Patient Account Number: [id number] Date of Birth/Gender: Treating RN: [birth year] (52 y.o. [name] Primary Care Physician: [name] Other Clinician: Referring Physician: Treating Physician/Extender: [name] in Treatment: 26 Education Assessment Education Provided To: Patient Education Topics Provided Wound/Skin Impairment: Handouts: Caring for Your Ulcer Methods: Explain/Verbal Responses: Reinforcements needed Electronic Signature(s) Unsigned Entered By: [name] on 07/29/2023 16:08:45 -------------------------------------------------------------------------------- Wound Assessment Details Patient Name: Date of Service: [name] [place], [name] RO LD 07/29/2023 3:45  PM Medical Record Number: [id number] Patient Account Number: [id number] Date of Birth/Sex: Treating RN: [birth year] (52 y.o. [name] Primary Care Armon Orvis: [name] Other Clinician: Referring Albina Gosney: Treating Stephaney Steven/Extender: [name] in Treatment: 26 Wound Status Wound Number: 3 Primary Diabetic Wound/Ulcer of the Lower Extremity Etiology: Wound Location: Left, Plantar Foot Wound Open Wounding Event: Gradually Appeared Status: Date Acquired: 10/01/2022 Comorbid Chronic sinus problems/congestion, Congestive Heart Failure, Weeks Of Treatment: 26 History: Coronary Artery Disease, Hypertension, Myocardial Infarction, Type Clustered Wound: No II Diabetes, Neuropathy Pending Amputation On Presentation Wound under treatment by Ariell Gunnels outside of [facility] ([id number]) [id number] Page 7 of 8 Wound Measurements Length: (cm) 2.5 Width: (cm) 1.5 Depth: (cm) 0.4 Area: (cm) 2.945 Volume: (cm) 1.178 % Reduction in Area: 25% % Reduction in Volume: -50.1% Epithelialization: Medium (34-66%) Tunneling: No Undermining: No Wound Description Classification: Grade 1 Wound Margin: Thickened Exudate Amount: Medium Exudate Type: Serosanguineous Exudate Color: red, brown Foul Odor After Cleansing: No Slough/Fibrino Yes Wound Bed Granulation Amount: Large (67-100%) Exposed Structure Granulation Quality: Red Fascia Exposed: No Necrotic Amount: None Present (0%) Fat Layer (Subcutaneous Tissue) Exposed: Yes Tendon Exposed: No Muscle Exposed: No Joint Exposed: No Bone Exposed: No Periwound Skin Texture Texture Color No Abnormalities Noted: No No Abnormalities Noted: No Callus: Yes Atrophie Blanche: No Crepitus: No Cyanosis: No Excoriation: No Ecchymosis: No Induration: No Erythema: No Rash: No Hemosiderin Staining: No Scarring: No Mottled: No Pallor: No Moisture Rubor: No No  Abnormalities Noted: No Dry / Scaly: No Temperature / Pain Maceration: No Temperature: No Abnormality Treatment Notes Wound #3 (Foot) Wound Laterality: Plantar, Left Cleanser Soap and Water Discharge Instruction: May shower and wash wound with dial antibacterial soap  and water prior to dressing change. Vashe 5.8 (oz) Discharge Instruction: Cleanse the wound with Vashe prior to applying a clean dressing using gauze sponges, not tissue or cotton balls. Peri-Wound Care Topical Gentamicin Discharge Instruction: As directed by physician Mupirocin Ointment Discharge Instruction: Apply Mupirocin (Bactroban) as instructed Primary Dressing Hydrofera Blue Ready Transfer Foam, 2.5x2.5 (in/in) Discharge Instruction: Apply directly to wound bed as directed Secondary Dressing [name], [name] ([id number]) [id number] Page 8 of 8 ABD Pad, 5x9 Drawtex 4x4 in Discharge Instruction: Apply over primary dressing as directed. Zetuvit Plus 4x8 in Discharge Instruction: Apply over primary dressing as directed. Secured With [employer], 4.5x3.1 (in/yd) Discharge Instruction: Secure with Kerlix as directed. 74M Medipore H Soft Cloth Surgical T ape, 4 x 10 (in/yd) Discharge Instruction: Secure with tape as directed. T contact cast otal Discharge Instruction: size 3 Compression Wrap Compression Stockings Add-Ons Electronic Signature(s) Unsigned Entered By: [name] on 07/29/2023 15:40:27 -------------------------------------------------------------------------------- Vitals Details Patient Name: Date of Service: [name] [place], [name] RO LD 07/29/2023 3:45 PM Medical Record Number: [id number] Patient Account Number: [id number] Date of Birth/Sex: Treating RN: 1971-10-30 (52 y.o. M) Primary Care Macio Kissoon: [name] Other Clinician: Referring Braiden Presutti: Treating Clyde Upshaw/Extender: [name] in Treatment: 26 Vital Signs Time Taken:  15:12 Temperature (F): 98.3 Height (in): 78 Pulse (bpm): 83 Weight (lbs): 275 Respiratory Rate (breaths/min): 18 Body Mass Index (BMI): 31.8 Blood Pressure (mmHg): 119/85 Reference Range: 80 - 120 mg / dl Electronic Signature(s) Signed: 07/29/2023 3:13:09 PM By: [name] EMT Entered By: [name] on 07/29/2023 15:13:09

## 2023-07-30 NOTE — Progress Notes (Signed)
OLYN, SCHRODER ([id number]) [id number] Page 1 of 12 Visit Report for 07/29/2023 Chief Complaint Document Details Patient Name: Date of Service: [name] [place], [name] RO LD 07/29/2023 3:45 PM Medical Record Number: [id number] Patient Account Number: [id number] Date of Birth/Sex: Treating RN: 1971/06/06 (52 y.o. M) Primary Care Provider: [name] Other Clinician: Referring Provider: Treating Provider/Extender: [name] in Treatment: 26 Information Obtained from: Patient Chief Complaint 01/15/2023; left foot wound Electronic Signature(s) Signed: 07/29/2023 4:37:23 PM By: Geralyn Corwin DO Entered By: Geralyn Corwin on 07/29/2023 16:14:37 -------------------------------------------------------------------------------- Debridement Details Patient Name: Date of Service: [name] [place], [name] 07/29/2023 3:45 PM Medical Record Number: [id number] Patient Account Number: [id number] Date of Birth/Sex: Treating RN: [birth year] (52 y.o. [name], [name] Primary Care Provider: [name] Other Clinician: Referring Provider: Treating Provider/Extender: [name] in Treatment: 26 Debridement Performed for Assessment: Wound #3 Left,Plantar Foot Performed By: Physician Geralyn Corwin, DO Debridement Type: Debridement Severity of Tissue Pre Debridement: Fat layer exposed Level of Consciousness (Pre-procedure): Awake and Alert Pre-procedure Verification/Time Out Yes - 16:00 Taken: Start Time: 16:01 Pain Control: Lidocaine 4% T opical Solution Percent of Wound Bed Debrided: 100% T Area Debrided (cm): otal 2.94 Tissue and other material debrided: Viable, Non-Viable, Cartilage, Slough, Subcutaneous, Skin: Dermis , Skin: Epidermis, Slough Level: Skin/Subcutaneous Tissue/Muscle/Bone Debridement Description: Excisional Instrument: Curette Bleeding: Minimum Hemostasis Achieved: Pressure End Time:  16:03 Procedural Pain: 0 Post Procedural Pain: 0 Response to Treatment: Procedure was tolerated well Level of Consciousness (Post- Awake and Alert procedure): Post Debridement Measurements of Total Wound Length: (cm) 2.5 Width: (cm) 1.5 Depth: (cm) 0.4 Volume: (cm) 1.178 Character of Wound/Ulcer Post Debridement: Improved EMARI, SIBBETT ([id number]) [id number] Page 2 of 12 Severity of Tissue Post Debridement: Fat layer exposed Post Procedure Diagnosis Same as Pre-procedure Electronic Signature(s) Signed: 07/29/2023 4:37:23 PM By: Geralyn Corwin DO Signed: 07/29/2023 6:21:10 PM By: Shawn Stall RN, BSN Entered By: Shawn Stall on 07/29/2023 16:03:53 -------------------------------------------------------------------------------- HPI Details Patient Name: Date of Service: [name] [place], [name] 07/29/2023 3:45 PM Medical Record Number: [id number] Patient Account Number: [id number] Date of Birth/Sex: Treating RN: 1971/03/11 (52 y.o. M) Primary Care Provider: [name] Other Clinician: Referring Provider: Treating Provider/Extender: [name] in Treatment: 26 History of Present Illness HPI Description: ADMISSION 01/11/2020 This is a 52 year old man with uncontrolled type 2 diabetes with a recent hemoglobin A1c earlier this year of 13.4. He is on insulin and glipizide. He does not take his blood sugars at home. He does have a follow-up with primary care later this month I believe on January 27. He tells [place] that roughly a month ago he was walking with a shoe with a hole in his foot. He took the shoe off and there was an open wound at roughly the left fourth met head. This has significant undermining and raised edges. He has not noticed any purulence he does not feel unwell. More recently he was taking skin off the bottom of his foot and has a superficial area on the left fifth met head. He has not been offloading this. The  patient was in the ER on 12/20. They gave him Bactroban which she has been using on the wound and 10 days worth of doxycycline. No x-rays were done. He has not had vascular studies. He is also been using hydrogen peroxide. Past medical history type 2 diabetes uncontrolled, chronic systolic heart failure, coronary artery disease with a history of congestive heart failure with stents.  Hypertension hyperlipidemia and chronic renal insufficiency ABI in this clinic was 1.14 on the left. 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 He is uncertain whether he would be able to work if we put him on some form of restriction 1/19; he is generally doing quite well. Using silver alginate on the wounds. Things actually look better. He has a forefoot offloading boot which she seems to be compliant about. He has support at work to stay off his foot is much as possible which is gratifying. Culture I did last week showed a few Enterococcus faecalis. I am going to put him on Augmentin. I talked about ordering an x-ray in my note last week but that does not seem to have happened. We will review reorder the x-ray this week. 1/26; x-ray reordered last week was negative for osteomyelitis. We are using silver alginate on the wound on the third and fifth met heads on the left. He is using a Darco forefoot off loader 2/2; the area on the fifth met head is closed. Third met head is still open with tunneling depth and thick callus. 2/9; the area on the fifth met head remains closed however the third met head again has a small open area on presentation with tunneling in depth and surrounded by thick callus. This looks like a pressure issue. We have been using silver alginate 2/16; the area of the fifth metatarsal head remains closed however the area over the third metatarsal head again is a small open area but with some depth. I do not think this is changed much since last week. He is using Hydrofera  Blue with forefoot off loader. He is not able to use a total contact cast on the left leg because he needs his left leg at work [employer]]. Fortunately the wound does not look infected. I changed him to endoform today 2/26; the area of the fifth metatarsal head remains closed. The area of the third metatarsal head has an even smaller opening this time. I used endoform on this this looks improved. He is offloading this is much as he can and a forefoot off loader on the left. He cannot have a total contact cast because of work responsibilities 3/5; the area on the fifth metatarsal head remains closed the area on the third metatarsal head is also closed on the left foot. 01/15/2023 Mr. Kenzell Lary is a 52 year old male with a past medical history of uncontrolled insulin-dependent type 2 diabetes with self-reported last hemoglobin A1c of 12, previous amputations to his feet bilaterally secondary to osteomyelitis, CAD and chronic combined systolic and diastolic heart failure that presents to the clinic for a 4-month history of nonhealing ulcer to the left lateral foot. He has been following for podiatry for several months for this issue. He is currently using wet-to-dry saline dressings. He currently denies signs of infection. Progresse note from 1/17; Patient presents with a 67-month history of nonhealing ulcer to the left foot secondary to diabetes and inability to offload well the area. He has had multiple debridements in the past to his feet bilaterally. He has had resection of the fourth left met head in the fifth toe Secondary to osteomyelitis. We discussed the importance of glycemic control for wound healing. Due to his blood glucose levels being elevated he is at high risk for infection and thus further amputation. He expressed understanding. He states he is supposed to be referred to an endocrinologist at [facility] however the  referral fell through. Unclear what happened. Offered a referral  to endocrinology at [facility]. Patient was agreeable.Furthermore we discussed the importance of aggressive offloading for his wound healing. This will be the most difficult part of the treatment plan for the patient to do. We discussed a total contact cast however he has declined that at this time. 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 He is currently using a surgical shoe with a peg assist. It does not fit well so we will give him a new one today. I do not think this is enough offloading. If he is not able to offload this area he will likely end up with a BKA. He is well aware of this. For now I recommended Medihoney and Hydrofera Blue for dressing changes. He will follow-up in our [place] office Since this is closer for him. PRESTIN, KUPERMAN ([id number]) [id number] Page 3 of 12 1/26; patient presents for follow-up. Last clinic visit I had seen him in [place] and we transferred him to [address] since this is a closer location for him. Progress note above from that visit. Over the past week He has been using Medihoney and Hydrofera Blue to the wound bed. He has been using his surgical shoe with peg assist. He has no issues or complaints today. We discussed doing the total contact cast and he was agreeable to having this placed at next clinic visit. 2/2; patient presents for follow-up. He has been using Vashe wet-to-dry dressings. Plan is for the total contact cast today. 2/5; patient presents for follow-up. The cast was placed 3 days ago. He tolerated this well however had a lot of drainage. He currently denies signs of infection. 2/9; patient presents for follow-up. At last clinic visit we held off on replacing the cast due to drainage. He has been using Vashe wet-to-dry dressings and he is currently taking the oral antibiotics prescribed without issues. He declines a total contact cast today.  We ran insurance verification for skin substitute and due to cost patient declines having this placed. 2/16; patient presents for follow-up. He has decided not to follow-up with podiatry. He continues to decline the total contact cast. He has been using Vashe wet- to-dry dressings. He has not been wearing his surgical shoe with peg assist. He currently denies signs of infection. 3/1; patient presents for follow-up. He has been doing Vashe wet-to-dry dressings. He has a surgical shoe with peg assist. We discussed potentially doing a skin substitute for which she has been approved for by insurance. He knows the out-of-pocket cost of this and would still like to proceed with having this placed today. He denies the current total contact cast. 3/8; patient presents for follow-up. He states that the skin substitute came off after a few days and he has been using Vashe wet-to-dry dressings. He has mild odor to the wound bed on exam. 3/15; patient presents for follow-up. He has been using Vashe wet-to-dry dressings and started his course of Augmentin. He reports improvement in wound healing. He has no issues or complaints today. 3/21; patient presents for follow-up. He continues to use Vashe wet-to-dry dressings. He has no issues or complaints today. 3/28; patient presents for follow-up. He has been using Vashe wet-to-dry dressings. He has no issues or complaints today. He has been using his peg assist with surgical shoe. 4/11; patient presents for follow-up. He has been using Vashe wet-to-dry  dressings. The periwound is macerated. He has been using his peg assist with surgical shoe. He has no issues or complaints today. 4/25; patient presents for follow-up. Has been using Vashe wet-to-dry dressings. He has been using zinc oxide to the periwound. For offloading he has been using his peg assist in surgical shoe. He has no issues or complaints today. 5/9; patient presents for follow-up. He has been using  Vashe wet-to-dry dressings. He has no issues or complaints today. He has a surgical shoe with peg assist for offloading. 5/23; patient presents for follow up. He has been using Vashe to clean the wound bed and collagen and blast X with dressing changes. [place] the [place] 2 [place]. Wound is bigger. 5/31; patient presents for follow-up. He has been using Vashe wet-to-dry dressings. Wound is stable but with healthier granulation tissue. Patient would like to hold off on the total contact cast and try a knee scooter for offloading. 6/13; patient presents for follow-up. He has been using Vashe wet-to-dry dressings. He has been using a knee scooter for offloading however states he felt unstable on this and decided to buy an electric scooter. This arrived in the mail today. He would like to hold off on the left total contact cast. Wound is slightly smaller today. 6/27; patient presents for follow-up. He has been using Vashe wet-to-dry dressings. He is using an Art gallery manager for mobility to help relieve the pressure off the wound bed. Wound is smaller today. He wants to hold off on doing the total contact cast as he is showing signs of improvement with wound healing. 7/11; patient presents for follow-up. He has been using Vashe wet-to-dry dressings and his electric scooter for offloading. Wound is slightly smaller. 7/25; patient presents for follow up. He has been using Vashe wet-to-dry dressings and he reports using his electric scooter for offloading. Wound is larger. 7/30; patient presents for follow-up. He has been using Vashe wet-to-dry dressings. He has taken Augmentin as prescribed without issues. Overall wound has healthier granulation tissue. Electronic Signature(s) Signed: 07/29/2023 4:37:23 PM By: Geralyn Corwin DO Entered By: Geralyn Corwin on 07/29/2023 16:22:54 -------------------------------------------------------------------------------- Physical Exam Details Patient Name:  Date of Service: [name] [place], [name] 07/29/2023 3:45 PM Medical Record Number: [id number] Patient Account Number: [id number] Date of Birth/Sex: Treating RN: [birth year] (52 y.o. M) Primary Care Provider: [name] Other Clinician: Referring Provider: Treating Provider/Extender: [name] in Treatment: 26 Constitutional respirations regular, non-labored and within target range for patient.[name] KINDALL, KENNEL ([id number]) [id number] Page 4 of 12 Cardiovascular 2+ dorsalis pedis/posterior tibialis pulses. Psychiatric pleasant and cooperative. Notes Left foot: T the lateral aspect fifth met head there is an open wound with granulation tissue, nonviable tissue and callus. No erythema or increased warmth to o the periwound. Electronic Signature(s) Signed: 07/29/2023 4:37:23 PM By: Geralyn Corwin DO Entered By: Geralyn Corwin on 07/29/2023 16:20:34 -------------------------------------------------------------------------------- Physician Orders Details Patient Name: Date of Service: [name] [place], [name] 07/29/2023 3:45 PM Medical Record Number: [id number] Patient Account Number: [id number] Date of Birth/Sex: Treating RN: 04-28-1971 (52 y.o. [name] Primary Care Provider: [name] Other Clinician: Referring Provider: Treating Provider/Extender: [name] in Treatment: 58 Verbal / Phone Orders: No Diagnosis Coding Follow-up Appointments ppointment in 1 week. - Dr Mikey Bussing Room 7 0845 08/07/2023 *****CAST****** Return A ppointment in 2 weeks. - Dr. Mikey Bussing room 8 0800 08/14/2023 *****CAST****** Return A ppointment in: - Dr. Mikey Bussing Thursday 07/31/2023 Thursday room 7  145pm *****CAST**** Return A Anesthetic (In clinic) Topical Lidocaine 4% applied to wound bed Cellular or Tissue Based Products Cellular or Tissue Based Product Type: - apligraf and grafix 50%  coinsurance 02/28/23-apply 1st Grafix 03/07/2023 HOLD THIS WEEK. Bathing/ Shower/ Hygiene May shower with protection but do not get wound dressing(s) wet. Protect dressing(s) with water repellant cover (for example, large plastic bag) or a cast cover and may then take shower. Off-Loading Total Contact Cast to Left Lower Extremity - size 3 heel cup to toes. Wound Treatment Wound #3 - Foot Wound Laterality: Plantar, Left Cleanser: Soap and Water 1 x Per Week/30 Days Discharge Instructions: May shower and wash wound with dial antibacterial soap and water prior to dressing change. Cleanser: Vashe 5.8 (oz) 1 x Per Week/30 Days Discharge Instructions: Cleanse the wound with Vashe prior to applying a clean dressing using gauze sponges, not tissue or cotton balls. Topical: Gentamicin 1 x Per Week/30 Days Discharge Instructions: As directed by physician Topical: Mupirocin Ointment 1 x Per Week/30 Days Discharge Instructions: Apply Mupirocin (Bactroban) as instructed Prim Dressing: Hydrofera Blue Ready Transfer Foam, 2.5x2.5 (in/in) 1 x Per Week/30 Days ary Discharge Instructions: Apply directly to wound bed as directed Secondary Dressing: ABD Pad, 5x9 1 x Per Week/30 Days Secondary Dressing: Drawtex 4x4 in 1 x Per Week/30 Days Discharge Instructions: Apply over primary dressing as directed. GAETANO, YOUNKINS ([id number]) [id number] Page 5 of 12 Secondary Dressing: Zetuvit Plus 4x8 in 1 x Per Week/30 Days Discharge Instructions: Apply over primary dressing as directed. Secured With: [employer], 4.5x3.1 (in/yd) 1 x Per Week/30 Days Discharge Instructions: Secure with Kerlix as directed. Secured With: 49M Medipore H Soft Cloth Surgical T ape, 4 x 10 (in/yd) 1 x Per Week/30 Days Discharge Instructions: Secure with tape as directed. Secured With: T contact cast 1 x Per Week/30 Days otal Discharge Instructions: size 3 Electronic Signature(s) Signed: 07/29/2023  4:37:23 PM By: Geralyn Corwin DO Entered By: Geralyn Corwin on 07/29/2023 16:20:41 -------------------------------------------------------------------------------- Problem List Details Patient Name: Date of Service: [name] [place], [name] 07/29/2023 3:45 PM Medical Record Number: [id number] Patient Account Number: [id number] Date of Birth/Sex: Treating RN: 10/26/1971 (52 y.o. [name] Primary Care Provider: [name] Other Clinician: Referring Provider: Treating Provider/Extender: [name] in Treatment: 26 Active Problems ICD-10 Encounter Code Description Active Date MDM Diagnosis [phone] Non-pressure chronic ulcer of other part of left foot with fat layer exposed 01/24/2023 No Yes E11.621 Type 2 diabetes mellitus with foot ulcer 01/24/2023 No Yes Z89.422 Acquired absence of other left toe(s) 01/24/2023 No Yes Inactive Problems Resolved Problems Electronic Signature(s) Signed: 07/29/2023 4:37:23 PM By: Geralyn Corwin DO Entered By: Geralyn Corwin on 07/29/2023 16:14:24 -------------------------------------------------------------------------------- Progress Note Details Patient Name: Date of Service: [name] IN, [name] 07/29/2023 3:45 PM Medical Record Number: [id number] Patient Account Number: [id number] Date of Birth/Sex: Treating RN: 1971-08-09 (52 y.o. Particia Nearing, [name] ([id number]) [id number] Page 6 of 12 Primary Care Provider: [name] Other Clinician: Referring Provider: Treating Provider/Extender: [name] in Treatment: 26 Subjective Chief Complaint Information obtained from Patient 01/15/2023; left foot wound History of Present Illness (HPI) ADMISSION 01/11/2020 This is a 52 year old man with uncontrolled type 2 diabetes with a recent hemoglobin A1c earlier this year of 13.4. He is on insulin and glipizide. He does not take his blood sugars at home. He  does have a follow-up with primary care later this month I believe on January 27. He tells [place] that  roughly a month ago he was walking with a shoe with a hole in his foot. He took the shoe off and there was an open wound at roughly the left fourth met head. This has significant undermining and raised edges. He has not noticed any purulence he does not feel unwell. More recently he was taking skin off the bottom of his foot and has a superficial area on the left fifth met head. He has not been offloading this. The patient was in the ER on 12/20. They gave him Bactroban which she has been using on the wound and 10 days worth of doxycycline. No x-rays were done. He has not had vascular studies. He is also been using hydrogen peroxide. Past medical history type 2 diabetes uncontrolled, chronic systolic heart failure, coronary artery disease with a history of congestive heart failure with stents. Hypertension hyperlipidemia and chronic renal insufficiency ABI in this clinic was 1.14 on the left. 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 He is uncertain whether he would be able to work if we put him on some form of restriction 1/19; he is generally doing quite well. Using silver alginate on the wounds. Things actually look better. He has a forefoot offloading boot which she seems to be compliant about. He has support at work to stay off his foot is much as possible which is gratifying. Culture I did last week showed a few Enterococcus faecalis. I am going to put him on Augmentin. I talked about ordering an x-ray in my note last week but that does not seem to have happened. We will review reorder the x-ray this week. 1/26; x-ray reordered last week was negative for osteomyelitis. We are using silver alginate on the wound on the third and fifth met heads on the left. He is using a Darco forefoot off loader 2/2; the area on the fifth met head is closed. Third met head is still open  with tunneling depth and thick callus. 2/9; the area on the fifth met head remains closed however the third met head again has a small open area on presentation with tunneling in depth and surrounded by thick callus. This looks like a pressure issue. We have been using silver alginate 2/16; the area of the fifth metatarsal head remains closed however the area over the third metatarsal head again is a small open area but with some depth. I do not think this is changed much since last week. He is using Hydrofera Blue with forefoot off loader. He is not able to use a total contact cast on the left leg because he needs his left leg at work [employer]]. Fortunately the wound does not look infected. I changed him to endoform today 2/26; the area of the fifth metatarsal head remains closed. The area of the third metatarsal head has an even smaller opening this time. I used endoform on this this looks improved. He is offloading this is much as he can and a forefoot off loader on the left. He cannot have a total contact cast because of work responsibilities 3/5; the area on the fifth metatarsal head remains closed the area on the third metatarsal head is also closed on the left foot. 01/15/2023 Mr. 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 He has been following for podiatry for several months for this issue. He is currently using wet-to-dry saline dressings. He currently denies signs of infection. Progresse note from 1/17; Patient presents with a 21-month history of nonhealing ulcer to the left foot secondary to diabetes and inability to offload well the area.  He has had multiple debridements in the past to his feet bilaterally. He has had resection of the fourth left met head in the fifth toe Secondary to osteomyelitis. We discussed the importance of glycemic control for wound healing. Due to his blood glucose levels being elevated he is at high risk for infection and thus further amputation. He expressed understanding. He states he is supposed to be referred to an endocrinologist at [facility] however the referral fell through. Unclear what happened. Offered a referral to endocrinology at [facility]. Patient was agreeable.Furthermore we discussed the importance of aggressive offloading for his wound healing. This will be the most difficult part of the treatment plan for the patient to do. We discussed a total contact cast however he has declined that at this time. 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 He is currently using a surgical shoe with a peg assist. It does not fit well so we will give him a new one today. I do not think this is enough offloading. If he is not able to offload this area he will likely end up with a BKA. He is well aware of this. For now I recommended Medihoney and Hydrofera Blue for dressing changes. He will follow-up in our [place] office Since this is closer for him. 1/26; patient presents for follow-up. Last clinic visit I had seen him in [place] and we transferred him to [place] since this is a closer location for him. Progress note above from that visit. Over the past week He has been using Medihoney and Hydrofera Blue to the wound bed. He has been using his surgical shoe with peg assist. He has no issues or complaints today. We discussed doing the total contact cast and he was agreeable to having this placed at next clinic visit. 2/2; patient presents for follow-up. He has been using Vashe wet-to-dry dressings. Plan is for the total contact cast  today. 2/5; patient presents for follow-up. The cast was placed 3 days ago. He tolerated this well however had a lot of drainage. He currently denies signs of infection. 2/9; patient presents for follow-up. At last clinic visit we held off on replacing the cast due to drainage. He has been using Vashe wet-to-dry dressings and he is currently taking the oral antibiotics prescribed without issues. He declines a total contact cast today. We ran insurance verification for skin substitute and due to cost patient declines having this placed. 2/16; patient presents for follow-up. He has decided not to follow-up with podiatry. He continues to decline the total contact cast. He has been using Vashe wet- to-dry dressings. He has not been wearing his surgical shoe with peg assist. He currently denies signs of infection. 3/1; patient presents for follow-up. He has been doing Vashe wet-to-dry dressings. He has a surgical shoe with peg assist. We discussed potentially doing a skin substitute for which she has been approved for by insurance. He knows the out-of-pocket cost of this and would still like  to proceed with having this placed today. He denies the current total contact cast. 3/8; patient presents for follow-up. He states that the skin substitute came off after a few days and he has been using Vashe wet-to-dry dressings. He has mild odor to the wound bed on exam. DENNES, VOLKOV ([id number]) [id number] Page 7 of 12 3/15; patient presents for follow-up. He has been using Vashe wet-to-dry dressings and started his course of Augmentin. He reports improvement in wound healing. He has no issues or complaints today. 3/21; patient presents for follow-up. He continues to use Vashe wet-to-dry dressings. He has no issues or complaints today. 3/28; patient presents for follow-up. He has been using Vashe wet-to-dry dressings. He has no issues or complaints today. He has been using his peg  assist with surgical shoe. 4/11; patient presents for follow-up. He has been using Vashe wet-to-dry dressings. The periwound is macerated. He has been using his peg assist with surgical shoe. He has no issues or complaints today. 4/25; patient presents for follow-up. Has been using Vashe wet-to-dry dressings. He has been using zinc oxide to the periwound. For offloading he has been using his peg assist in surgical shoe. He has no issues or complaints today. 5/9; patient presents for follow-up. He has been using Vashe wet-to-dry dressings. He has no issues or complaints today. He has a surgical shoe with peg assist for offloading. 5/23; patient presents for follow up. He has been using Vashe to clean the wound bed and collagen and blast X with dressing changes. [place] went to the [place] 2 [place]. Wound is bigger. 5/31; patient presents for follow-up. He has been using Vashe wet-to-dry dressings. Wound is stable but with healthier granulation tissue. Patient would like to hold off on the total contact cast and try a knee scooter for offloading. 6/13; patient presents for follow-up. He has been using Vashe wet-to-dry dressings. He has been using a knee scooter for offloading however states he felt unstable on this and decided to buy an electric scooter. This arrived in the mail today. He would like to hold off on the left total contact cast. Wound is slightly smaller today. 6/27; patient presents for follow-up. He has been using Vashe wet-to-dry dressings. He is using an Art gallery manager for mobility to help relieve the pressure off the wound bed. Wound is smaller today. He wants to hold off on doing the total contact cast as he is showing signs of improvement with wound healing. 7/11; patient presents for follow-up. He has been using Vashe wet-to-dry dressings and his electric scooter for offloading. Wound is slightly smaller. 7/25; patient presents for follow up. He has been using Vashe wet-to-dry  dressings and he reports using his electric scooter for offloading. Wound is larger. 7/30; patient presents for follow-up. He has been using Vashe wet-to-dry dressings. He has taken Augmentin as prescribed without issues. Overall wound has healthier granulation tissue. Patient History Information obtained from Patient. Family History Heart Disease - Mother,Father, No family history of Cancer, Diabetes, Hereditary Spherocytosis, Hypertension, Kidney Disease, Lung Disease, Seizures, Stroke, Thyroid Problems, Tuberculosis. Social History Never smoker, Marital Status - Single, Alcohol Use - Rarely, Drug Use - No History, Caffeine Use - Rarely. Medical History Eyes Denies history of Cataracts, Glaucoma, Optic Neuritis Ear/Nose/Mouth/Throat Patient has history of Chronic sinus problems/congestion Denies history of Middle ear problems Hematologic/Lymphatic Denies history of Anemia, Hemophilia, Human Immunodeficiency Virus, Lymphedema, Sickle Cell Disease Respiratory Denies history of Aspiration, Asthma, Chronic Obstructive Pulmonary Disease (COPD), Pneumothorax, Sleep Apnea, Tuberculosis  Cardiovascular Patient has history of Congestive Heart Failure, Coronary Artery Disease, Hypertension, Myocardial Infarction - age 46 Denies history of Angina, Arrhythmia, Deep Vein Thrombosis, Hypotension, Peripheral Arterial Disease, Peripheral Venous Disease, Phlebitis, Vasculitis Gastrointestinal Denies history of Cirrhosis , Colitis, Crohns, Hepatitis A, Hepatitis B, Hepatitis C Endocrine Patient has history of Type II Diabetes Genitourinary Denies history of End Stage Renal Disease Immunological Denies history of Lupus Erythematosus, Raynauds, Scleroderma Integumentary (Skin) Denies history of History of Burn Musculoskeletal Denies history of Gout, Rheumatoid Arthritis, Osteoarthritis, Osteomyelitis Neurologic Patient has history of Neuropathy Denies history of Dementia, Quadriplegia, Paraplegia,  Seizure Disorder Oncologic Denies history of Received Chemotherapy, Received Radiation Psychiatric Denies history of Anorexia/bulimia, Confinement Anxiety Hospitalization/Surgery History - Heart Cath in 2018. Medical A Surgical History Notes nd Cardiovascular Ischemic Cardiomyopathy Genitourinary Renal Insufficiency CHARVIS, GHOLAR ([id number]) [id number] Page 8 of 12 Objective Constitutional respirations regular, non-labored and within target range for patient.. Vitals Time Taken: 3:12 PM, Height: 78 in, Weight: 275 lbs, BMI: 31.8, Temperature: 98.3 F, Pulse: 83 bpm, Respiratory Rate: 18 breaths/min, Blood Pressure: 119/85 mmHg. Cardiovascular 2+ dorsalis pedis/posterior tibialis pulses. Psychiatric pleasant and cooperative. General Notes: Left foot: T the lateral aspect fifth met head there is an open wound with granulation tissue, nonviable tissue and callus. No erythema or o increased warmth to the periwound. Integumentary (Hair, Skin) Wound #3 status is Open. Original cause of wound was Gradually Appeared. The date acquired was: 10/01/2022. The wound has been in treatment 26 weeks. The wound is located on the Left,Plantar Foot. The wound measures 2.5cm length x 1.5cm width x 0.4cm depth; 2.945cm^2 area and 1.178cm^3 volume. There is Fat Layer (Subcutaneous Tissue) exposed. There is no tunneling or undermining noted. There is a medium amount of serosanguineous drainage noted. The wound margin is thickened. There is large (67-100%) red granulation within the wound bed. There is no necrotic tissue within the wound bed. The periwound skin appearance exhibited: Callus. The periwound skin appearance did not exhibit: Crepitus, Excoriation, Induration, Rash, Scarring, Dry/Scaly, Maceration, Atrophie Blanche, Cyanosis, Ecchymosis, Hemosiderin Staining, Mottled, Pallor, Rubor, Erythema. Periwound temperature was noted as No Abnormality. 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 No signs of infection. I debrided nonviable tissue. We discussed retrying the total contact cast today and patient was agreeable. We will use Hydrofera Blue and antibiotic ointment under the cast. Follow-up in 2-3 days for obligatory cast change. Procedures Wound #3 Pre-procedure diagnosis of Wound #3 is a Diabetic Wound/Ulcer of the Lower Extremity located on the Left,Plantar Foot .Severity of Tissue Pre Debridement is: Fat layer exposed. There was a Excisional Skin/Subcutaneous Tissue/Muscle/Bone Debridement with a total area of 2.94 sq cm performed by Geralyn Corwin, DO. With the following instrument(s): Curette to remove Viable and Non-Viable tissue/material. Material removed includes Cartilage, Subcutaneous Tissue, Slough, Skin: Dermis, and Skin: Epidermis after achieving pain control using Lidocaine 4% Topical Solution. A time out was conducted at 16:00, prior to the start of the procedure. A Minimum amount of bleeding was controlled with Pressure. The procedure was tolerated well with a pain level of 0 throughout and a pain level of 0 following the procedure. Post Debridement Measurements: 2.5cm length x 1.5cm width x 0.4cm depth; 1.178cm^3 volume. Character of Wound/Ulcer Post Debridement is improved. Severity of Tissue Post Debridement is: Fat layer exposed. Post procedure  Diagnosis Wound #3: Same as Pre-Procedure Pre-procedure diagnosis of Wound #3 is a Diabetic Wound/Ulcer of the Lower Extremity located on the Left,Plantar Foot . There was a T Research scientist (life sciences) otal Procedure by Geralyn Corwin, DO. Post procedure Diagnosis Wound #3: Same as Pre-Procedure Notes: size 3. Plan Follow-up Appointments: Return Appointment in 1 week. - Dr  Mikey Bussing Room 7 0845 08/07/2023 *****CAST****** Return Appointment in 2 weeks. - Dr. Mikey Bussing room 8 0800 08/14/2023 *****CAST****** Return Appointment in: - Dr. Mikey Bussing Thursday 07/31/2023 Thursday room 7 145pm *****CAST**** Anesthetic: (In clinic) Topical Lidocaine 4% applied to wound bed SHADARIUS, JESKE ([id number]) [id number] Page 9 of 12 Cellular or Tissue Based Products: Cellular or Tissue Based Product Type: - apligraf and grafix 50% coinsurance 02/28/23-apply 1st Grafix 03/07/2023 HOLD THIS WEEK. Bathing/ Shower/ Hygiene: May shower with protection but do not get wound dressing(s) wet. Protect dressing(s) with water repellant cover (for example, large plastic bag) or a cast cover and may then take shower. Off-Loading: T Contact Cast to Left Lower Extremity - size 3 heel cup to toes. otal WOUND #3: - Foot Wound Laterality: Plantar, Left Cleanser: Soap and Water 1 x Per Week/30 Days Discharge Instructions: May shower and wash wound with dial antibacterial soap and water prior to dressing change. Cleanser: Vashe 5.8 (oz) 1 x Per Week/30 Days Discharge Instructions: Cleanse the wound with Vashe prior to applying a clean dressing using gauze sponges, not tissue or cotton balls. Topical: Gentamicin 1 x Per Week/30 Days Discharge Instructions: As directed by physician Topical: Mupirocin Ointment 1 x Per Week/30 Days Discharge Instructions: Apply Mupirocin (Bactroban) as instructed Prim Dressing: Hydrofera Blue Ready Transfer Foam, 2.5x2.5 (in/in) 1 x Per Week/30 Days ary Discharge Instructions: Apply directly to wound bed as directed Secondary Dressing: ABD Pad, 5x9 1 x Per Week/30 Days Secondary Dressing: Drawtex 4x4 in 1 x Per Week/30 Days Discharge Instructions: Apply over primary dressing as directed. Secondary Dressing: Zetuvit Plus 4x8 in 1 x Per Week/30 Days Discharge Instructions: Apply over primary dressing as directed. Secured With: [employer],  4.5x3.1 (in/yd) 1 x Per Week/30 Days Discharge Instructions: Secure with Kerlix as directed. Secured With: 22M Medipore H Soft Cloth Surgical T ape, 4 x 10 (in/yd) 1 x Per Week/30 Days Discharge Instructions: Secure with tape as directed. Secured With: T contact cast 1 x Per Week/30 Days otal Discharge Instructions: size 3 1. In office sharp debridement 2. Hydrofera Blue with antibiotic ointment 3. T contact cast placed in standard fashion otal 4. Follow-up in 2 to 3 days Electronic Signature(s) Signed: 07/29/2023 4:37:23 PM By: Geralyn Corwin DO Entered By: Geralyn Corwin on 07/29/2023 16:29:12 -------------------------------------------------------------------------------- HxROS Details Patient Name: Date of Service: [name] IN, [name] 07/29/2023 3:45 PM Medical Record Number: [id number] Patient Account Number: [id number] Date of Birth/Sex: Treating RN: 1971-12-18 (52 y.o. M) Primary Care Provider: [name] Other Clinician: Referring Provider: Treating Provider/Extender: [name] in Treatment: 26 Information Obtained From Patient Eyes Medical History: Negative for: Cataracts; Glaucoma; Optic Neuritis Ear/Nose/Mouth/Throat Medical History: Positive for: Chronic sinus problems/congestion Negative for: Middle ear problems Hematologic/Lymphatic Medical History: Negative for: Anemia; Hemophilia; Human Immunodeficiency Virus; Lymphedema; Sickle Cell Disease Respiratory Medical HistoryMarland Kitchen EATHON, EVELAND ([id number]) [id number] Page 10 of 12 Negative for: Aspiration; Asthma; Chronic Obstructive Pulmonary Disease (COPD); Pneumothorax; Sleep Apnea; Tuberculosis Cardiovascular Medical History: Positive for: Congestive Heart Failure; Coronary Artery Disease; Hypertension; Myocardial Infarction - age 75 Negative for: Angina; Arrhythmia; Deep Vein Thrombosis; Hypotension; Peripheral Arterial Disease; Peripheral  Venous Disease;  Phlebitis; Vasculitis Past Medical History Notes: Ischemic Cardiomyopathy Gastrointestinal Medical History: Negative for: Cirrhosis ; Colitis; Crohns; Hepatitis A; Hepatitis B; Hepatitis C Endocrine Medical History: Positive for: Type II Diabetes Time with diabetes: ten years Treated with: Insulin Blood sugar tested every day: No Genitourinary Medical History: Negative for: End Stage Renal Disease Past Medical History Notes: Renal Insufficiency Immunological Medical History: Negative for: Lupus Erythematosus; Raynauds; Scleroderma Integumentary (Skin) Medical History: Negative for: History of Burn Musculoskeletal Medical History: Negative for: Gout; Rheumatoid Arthritis; Osteoarthritis; Osteomyelitis Neurologic Medical History: Positive for: Neuropathy Negative for: Dementia; Quadriplegia; Paraplegia; Seizure Disorder Oncologic Medical History: Negative for: Received Chemotherapy; Received Radiation Psychiatric Medical History: Negative for: Anorexia/bulimia; Confinement Anxiety HBO Extended History Items Ear/Nose/Mouth/Throat: Chronic sinus problems/congestion Immunizations Pneumococcal Vaccine: Received Pneumococcal Vaccination: No Implantable Devices None Hospitalization / Surgery History Type of Hospitalization/Surgery Heart Cath in 2018 PURVIS, CROSWELL ([id number]) [id number] Page 45 of 47 Family and Social History Cancer: No; Diabetes: No; Heart Disease: Yes - Mother,Father; Hereditary Spherocytosis: No; Hypertension: No; Kidney Disease: No; Lung Disease: No; Seizures: No; Stroke: No; Thyroid Problems: No; Tuberculosis: No; Never smoker; Marital Status - Single; Alcohol Use: Rarely; Drug Use: No History; Caffeine Use: Rarely; Financial Concerns: No; Food, Clothing or Shelter Needs: No; Support System Lacking: No; Transportation Concerns: No Electronic Signature(s) Signed: 07/29/2023 4:37:23 PM By: Geralyn Corwin  DO Entered By: Geralyn Corwin on 07/29/2023 16:18:52 -------------------------------------------------------------------------------- Total Contact Cast Details Patient Name: Date of Service: [name] Nunez, [name] 07/29/2023 3:45 PM Medical Record Number: [id number] Patient Account Number: [id number] Date of Birth/Sex: Treating RN: [birth year] (52 y.o. [name] Primary Care Provider: [name] Other Clinician: Referring Provider: Treating Provider/Extender: [name] in Treatment: 26 T Contact Cast Applied for Wound Assessment: otal Wound #3 Left,Plantar Foot Performed By: Physician Geralyn Corwin, DO Post Procedure Diagnosis Same as Pre-procedure Notes size 3 Electronic Signature(s) Signed: 07/29/2023 4:37:23 PM By: Geralyn Corwin DO Signed: 07/29/2023 6:21:10 PM By: Shawn Stall RN, BSN Entered By: Shawn Stall on 07/29/2023 16:02:24 -------------------------------------------------------------------------------- SuperBill Details Patient Name: Date of Service: [name] IN, [name] 07/29/2023 Medical Record Number: [id number] Patient Account Number: [id number] Date of Birth/Sex: Treating RN: August 16, 1971 (52 y.o. [name], Yvonne Kendall Primary Care Provider: [name] Other Clinician: Referring Provider: Treating Provider/Extender: [name] in Treatment: 26 Diagnosis Coding ICD-10 Codes Code Description [phone] 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.621 Type 2 diabetes mellitus with foot ulcer Z89.422 Acquired absence of other left toe(s) Facility Procedures : ANDERSON LEPERA Code: [id number] , Joshawn ([id number] Description: 11042 - DEB SUBQ TISSUE 20 SQ CM/< ICD-10 Diagnosis Description L97.522 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.621 Type 2 diabetes mellitus with foot ulcer 9) [id number] Quantity: 1 7.pdf Page 12 of 12 Physician Procedures : CPT4 Code Description Modifier [id number] 11042 - WC PHYS SUBQ TISS 20 SQ CM ICD-10 Diagnosis Description L97.522 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.621 Type 2 diabetes mellitus with foot ulcer Quantity: 1 Electronic Signature(s) Signed: 07/29/2023 4:37:23 PM By: Geralyn Corwin DO Entered By: Geralyn Corwin on 07/29/2023 16:29:21

## 2023-07-31 ENCOUNTER — Other Ambulatory Visit: Payer: Self-pay

## 2023-07-31 ENCOUNTER — Encounter (HOSPITAL_BASED_OUTPATIENT_CLINIC_OR_DEPARTMENT_OTHER): Payer: 59 | Attending: Internal Medicine | Admitting: Internal Medicine

## 2023-07-31 DIAGNOSIS — Z794 Long term (current) use of insulin: Secondary | ICD-10-CM | POA: Diagnosis not present

## 2023-07-31 DIAGNOSIS — N189 Chronic kidney disease, unspecified: Secondary | ICD-10-CM | POA: Insufficient documentation

## 2023-07-31 DIAGNOSIS — Z955 Presence of coronary angioplasty implant and graft: Secondary | ICD-10-CM | POA: Insufficient documentation

## 2023-07-31 DIAGNOSIS — I252 Old myocardial infarction: Secondary | ICD-10-CM | POA: Insufficient documentation

## 2023-07-31 DIAGNOSIS — Z89422 Acquired absence of other left toe(s): Secondary | ICD-10-CM | POA: Diagnosis not present

## 2023-07-31 DIAGNOSIS — I130 Hypertensive heart and chronic kidney disease with heart failure and stage 1 through stage 4 chronic kidney disease, or unspecified chronic kidney disease: Secondary | ICD-10-CM | POA: Diagnosis not present

## 2023-07-31 DIAGNOSIS — L97522 Non-pressure chronic ulcer of other part of left foot with fat layer exposed: Secondary | ICD-10-CM | POA: Diagnosis not present

## 2023-07-31 DIAGNOSIS — I2510 Atherosclerotic heart disease of native coronary artery without angina pectoris: Secondary | ICD-10-CM | POA: Diagnosis not present

## 2023-07-31 DIAGNOSIS — E1122 Type 2 diabetes mellitus with diabetic chronic kidney disease: Secondary | ICD-10-CM | POA: Diagnosis not present

## 2023-07-31 DIAGNOSIS — E785 Hyperlipidemia, unspecified: Secondary | ICD-10-CM | POA: Diagnosis not present

## 2023-07-31 DIAGNOSIS — I5042 Chronic combined systolic (congestive) and diastolic (congestive) heart failure: Secondary | ICD-10-CM | POA: Diagnosis not present

## 2023-07-31 DIAGNOSIS — E11621 Type 2 diabetes mellitus with foot ulcer: Secondary | ICD-10-CM | POA: Insufficient documentation

## 2023-08-01 ENCOUNTER — Other Ambulatory Visit: Payer: Self-pay

## 2023-08-01 NOTE — Progress Notes (Signed)
Glenn, [name] ([id number]) [id number] Page 1 of 8 Visit Report for 07/31/2023 Arrival Information Details Patient Name: Date of Service: [name] [place], [name] RO LD 07/31/2023 1:45 PM Medical Record Number: [id number] Patient Account Number: [id number] Date of Birth/Sex: Treating RN: September 13, 1971 (52 y.o. [name], [name] Primary Care : [name] Other Clinician: Referring : Treating /Extender: [name] in Treatment: 26 Visit Information History Since Last Visit Added or deleted any medications: No Patient Arrived: Ambulatory Any new allergies or adverse reactions: No Arrival Time: 13:53 Had a fall or experienced change in No Accompanied By: self activities of daily living that may affect Transfer Assistance: None risk of falls: Patient Identification Verified: Yes Signs or symptoms of abuse/neglect since last visito No Secondary Verification Process Completed: Yes Hospitalized since last visit: No Patient Requires Transmission-Based Precautions: No Implantable device outside of the clinic excluding No Patient Has Alerts: Yes cellular tissue based products placed in the center Patient Alerts: 08/2021 ABI:1.2 TBI 0.79 since last visit: Has Dressing in Place as Prescribed: Yes Has Footwear/Offloading in Place as Prescribed: Yes Left: T Contact Cast otal Pain Present Now: No Electronic Signature(s) Signed: 07/31/2023 5:47:40 PM By: Glenn Stall RN, BSN Entered By: [name] on 07/31/2023 13:53:28 -------------------------------------------------------------------------------- Encounter Discharge Information Details Patient Name: Date of Service: [name] [place], [name] 07/31/2023 1:45 PM Medical Record Number: [id number] Patient Account Number: [id number] Date of Birth/Sex: Treating RN: 05/30/1971 (52 y.o. [name] Primary Care : [name] Other Clinician: Referring  : Treating /Extender: [name] in Treatment: 26 Encounter Discharge Information Items Post Procedure Vitals Discharge Condition: Stable Temperature (F): 98.3 Ambulatory Status: Ambulatory Pulse (bpm): 87 Discharge Destination: Home Respiratory Rate (breaths/min): 20 Transportation: Private Auto Blood Pressure (mmHg): 115/82 Accompanied By: self Schedule Follow-up Appointment: Yes Clinical Summary of Care: Electronic Signature(s) Signed: 07/31/2023 5:47:40 PM By: Glenn Stall RN, BSN Entered By: [name] on 07/31/2023 14:05:43 [name] ([id number]) [id number] Page 2 of 8 -------------------------------------------------------------------------------- Lower Extremity Assessment Details Patient Name: Date of Service: [name] [place], [name] RO LD 07/31/2023 1:45 PM Medical Record Number: [id number] Patient Account Number: [id number] Date of Birth/Sex: Treating RN: 12-Jun-1971 (52 y.o. [name] Primary Care : [name] Other Clinician: Referring : Treating /Extender: [name] in Treatment: 26 Edema Assessment Assessed: [name]: Yes] [name]: No] Edema: [Left: N] [Right: o] Calf Left: Right: Point of Measurement: 43 cm From Medial Instep 38 cm Ankle Left: Right: Point of Measurement: 9 cm From Medial Instep 23 cm Vascular Assessment Pulses: Dorsalis Pedis Palpable: [Left:Yes] Extremity colors, hair growth, and conditions: Extremity Color: [Left:Normal] Hair Growth on Extremity: [Left:Yes] Temperature of Extremity: [Left:Warm] Capillary Refill: [Left:< 3 seconds] Dependent Rubor: [Left:No] Blanched when Elevated: [Left:No No] Toe Nail Assessment Left: Right: Thick: No Discolored: No Deformed: No Improper Length and Hygiene: No Electronic Signature(s) Signed: 07/31/2023 5:47:40 PM By: Glenn Stall RN, BSN Entered By: [name]  on 07/31/2023 13:54:02 -------------------------------------------------------------------------------- Multi Wound Chart Details Patient Name: Date of Service: [name] [place], [name] 07/31/2023 1:45 PM Medical Record Number: [id number] Patient Account Number: [id number] Date of Birth/Sex: Treating RN: Aug 19, 1971 (52 y.o. M) Primary Care : [name] Other Clinician: Referring : Treating /Extender: [name] in Treatment: 26 Vital Signs Height(in): 78 Pulse(bpm): 87 Weight(lbs): 275 Blood Pressure(mmHg): 115/82 Body Mass Index(BMI): 31.8 Temperature(F): 98.3 [name], [name] ([id number]) [id number] Page 3 of 8 Respiratory Rate(breaths/min): 20 [3:Photos:] [N/A:N/A] Left, Plantar Foot N/A N/A  Wound Location: Gradually Appeared N/A N/A Wounding Event: Diabetic Wound/Ulcer of the Lower N/A N/A Primary Etiology: Extremity Chronic sinus problems/congestion, N/A N/A Comorbid History: Congestive Heart Failure, Coronary Artery Disease, Hypertension, Myocardial Infarction, Type II Diabetes, Neuropathy 10/01/2022 N/A N/A Date Acquired: 22 N/A N/A Weeks of Treatment: Open N/A N/A Wound Status: No N/A N/A Wound Recurrence: Yes N/A N/A Pending [name] on Presentation: 2.1x1.2x0.4 N/A N/A Measurements L x W x D (cm) 1.979 N/A N/A A (cm) : rea 0.792 N/A N/A Volume (cm) : 49.60% N/A N/A % Reduction in A rea: -0.90% N/A N/A % Reduction in Volume: Grade 1 N/A N/A Classification: Medium N/A N/A Exudate A mount: Serosanguineous N/A N/A Exudate Type: red, brown N/A N/A Exudate Color: Thickened N/A N/A Wound Margin: Large (67-100%) N/A N/A Granulation A mount: Red N/A N/A Granulation Quality: Small (1-33%) N/A N/A Necrotic A mount: Fat Layer (Subcutaneous Tissue): Yes N/A N/A Exposed Structures: Fascia: No Tendon: No Muscle: No Joint: No Bone: No Medium (34-66%) N/A  N/A Epithelialization: Debridement - Excisional N/A N/A Debridement: Pre-procedure Verification/Time Out 14:00 N/A N/A Taken: Lidocaine 4% Topical Solution N/A N/A Pain Control: Callus, Subcutaneous, Slough N/A N/A Tissue Debrided: Skin/Subcutaneous Tissue N/A N/A Level: 1.98 N/A N/A Debridement A (sq cm): rea Curette N/A N/A Instrument: Minimum N/A N/A Bleeding: Pressure N/A N/A Hemostasis A chieved: 0 N/A N/A Procedural Pain: 0 N/A N/A Post Procedural Pain: Procedure was tolerated well N/A N/A Debridement Treatment Response: 2.1x1.2x0.4 N/A N/A Post Debridement Measurements L x W x D (cm) 0.792 N/A N/A Post Debridement Volume: (cm) Callus: Yes N/A N/A Periwound Skin Texture: Excoriation: No Induration: No Crepitus: No Rash: No Scarring: No Maceration: No N/A N/A Periwound Skin Moisture: Dry/Scaly: No Atrophie Blanche: No N/A N/A Periwound Skin Color: Cyanosis: No Ecchymosis: No Erythema: No Hemosiderin Staining: No Mottled: No Pallor: No Rubor: No No Abnormality N/A N/A Temperature: Debridement N/A N/A Procedures Performed: T [address] [name], [name] ([id number]) [id number] Page 4 of 8 Treatment Notes Wound #3 (Foot) Wound Laterality: Plantar, Left Cleanser Soap and Water Discharge Instruction: May shower and wash wound with dial antibacterial soap and water prior to dressing change. Vashe 5.8 (oz) Discharge Instruction: Cleanse the wound with Vashe prior to applying a clean dressing using gauze sponges, not tissue or cotton balls. Peri-Wound Care Topical Gentamicin Discharge Instruction: As directed by physician Mupirocin Ointment Discharge Instruction: Apply Mupirocin (Bactroban) as instructed Primary Dressing Hydrofera Blue Ready Transfer Foam, 2.5x2.5 (in/in) Discharge Instruction: Apply directly to wound bed as directed Secondary Dressing ABD Pad, 5x9 Zetuvit Plus 4x8 in Discharge Instruction:  Apply over primary dressing as directed. Secured With [employer], 4.5x3.1 (in/yd) Discharge Instruction: Secure with Kerlix as directed. 30M Medipore H Soft Cloth Surgical T ape, 4 x 10 (in/yd) Discharge Instruction: Secure with tape as directed. T contact cast otal Discharge Instruction: size 3 Compression Wrap Compression Stockings Add-Ons Electronic Signature(s) Signed: 07/31/2023 4:31:19 PM By: Geralyn Corwin DO Entered By: Geralyn Corwin on 07/31/2023 14:38:47 -------------------------------------------------------------------------------- Multi-Disciplinary Care Plan Details Patient Name: Date of Service: [name] [place], [name] 07/31/2023 1:45 PM Medical Record Number: [id number] Patient Account Number: [id number] Date of Birth/Sex: Treating RN: January 16, 1971 (52 y.o. [name] Primary Care : [name] Other Clinician: Referring : Treating /Extender: [name] in Treatment: 26 Active Inactive Wound/Skin Impairment Nursing Diagnoses: Impaired tissue integrity Knowledge deficit related to ulceration/compromised skin integrity Goals: [name], [name] ([id number]) [phone].pdf Page 5 of 8 Patient will have a decrease  in wound volume by X% from date: (specify in notes) Date Initiated: 01/24/2023 Target Resolution Date: 08/30/2023 Goal Status: Active Patient/caregiver will verbalize understanding of skin care regimen Date Initiated: 01/24/2023 Target Resolution Date: 08/30/2023 Goal Status: Active Ulcer/skin breakdown will have a volume reduction of 30% by week 4 Date Initiated: 01/24/2023 Date Inactivated: 03/07/2023 Target Resolution Date: 02/27/2023 Goal Status: Unmet Unmet Reason: larger today. Ulcer/skin breakdown will have a volume reduction of 50% by week 8 Date Initiated: 01/24/2023 Target Resolution Date: 08/30/2023 Goal Status: Active Interventions: Assess patient/caregiver  ability to obtain necessary supplies Assess patient/caregiver ability to perform ulcer/skin care regimen upon admission and as needed Assess ulceration(s) every visit Notes: Electronic Signature(s) Signed: 07/31/2023 5:47:40 PM By: Glenn Stall RN, BSN Entered By: [name] on 07/31/2023 13:59:16 -------------------------------------------------------------------------------- Pain Assessment Details Patient Name: Date of Service: [name] [place], [name] 07/31/2023 1:45 PM Medical Record Number: [id number] Patient Account Number: [id number] Date of Birth/Sex: Treating RN: 23-Feb-1971 (52 y.o. [name] Primary Care : [name] Other Clinician: Referring : Treating /Extender: [name] in Treatment: 26 Active Problems Location of Pain Severity and Description of Pain Patient Has Paino No Site Locations Pain Management and Medication Current Pain Management: Electronic Signature(s) Signed: 07/31/2023 5:47:40 PM By: Glenn Stall RN, BSN Entered By: [name] on 07/31/2023 13:53:47 [name] ([id number]) [id number] Page 6 of 8 -------------------------------------------------------------------------------- Patient/Caregiver Education Details Patient Name: Date of Service: [name] [place], [name] RO LD 8/1/2024andnbsp1:45 PM Medical Record Number: [id number] Patient Account Number: [id number] Date of Birth/Gender: Treating RN: 02-27-1971 (51 y.o. [name] Primary Care Physician: [name] Other Clinician: Referring Physician: Treating Physician/Extender: [name] in Treatment: 26 Education Assessment Education Provided To: Patient Education Topics Provided Wound/Skin Impairment: Handouts: Caring for Your Ulcer Methods: Explain/Verbal Responses: Reinforcements needed Electronic Signature(s) Signed: 07/31/2023 5:47:40 PM By: Glenn Stall RN,  BSN Entered By: [name] on 07/31/2023 13:59:30 -------------------------------------------------------------------------------- Wound Assessment Details Patient Name: Date of Service: [name] [place], [name] 07/31/2023 1:45 PM Medical Record Number: [id number] Patient Account Number: [id number] Date of Birth/Sex: Treating RN: 1971-10-07 (52 y.o. [name], [name] Primary Care : [name] Other Clinician: Referring : Treating /Extender: [name] in Treatment: 26 Wound Status Wound Number: 3 Primary Diabetic Wound/Ulcer of the Lower Extremity Etiology: Wound Location: Left, Plantar Foot Wound Open Wounding Event: Gradually Appeared Status: Date Acquired: 10/01/2022 Comorbid Chronic sinus problems/congestion, Congestive Heart Failure, Weeks Of Treatment: 26 History: Coronary Artery Disease, Hypertension, Myocardial Infarction, Type Clustered Wound: No II Diabetes, Neuropathy Pending Amputation On Presentation Wound under treatment by  outside of Wound Center Photos Wound Measurements [name], [name] ([id number]) Length: (cm) 2.1 Width: (cm) 1.2 Depth: (cm) 0.4 Area: (cm) 1.979 Volume: (cm) 0.792 [id number] Page 7 of 8 % Reduction in Area: 49.6% % Reduction in Volume: -0.9% Epithelialization: Medium (34-66%) Tunneling: No Undermining: No Wound Description Classification: Grade 1 Wound Margin: Thickened Exudate Amount: Medium Exudate Type: Serosanguineous Exudate Color: red, brown Foul Odor After Cleansing: No Slough/Fibrino Yes Wound Bed Granulation Amount: Large (67-100%) Exposed Structure Granulation Quality: Red Fascia Exposed: No Necrotic Amount: Small (1-33%) Fat Layer (Subcutaneous Tissue) Exposed: Yes Necrotic Quality: Adherent Slough Tendon Exposed: No Muscle Exposed: No Joint Exposed: No Bone Exposed: No Periwound Skin Texture Texture Color No Abnormalities  Noted: No No Abnormalities Noted: No Callus: Yes Atrophie Blanche: No Crepitus: No Cyanosis: No Excoriation: No Ecchymosis: No Induration: No Erythema: No Rash: No Hemosiderin Staining: No  Scarring: No Mottled: No Pallor: No Moisture Rubor: No No Abnormalities Noted: No Dry / Scaly: No Temperature / Pain Maceration: No Temperature: No Abnormality Treatment Notes Wound #3 (Foot) Wound Laterality: Plantar, Left Cleanser Soap and Water Discharge Instruction: May shower and wash wound with dial antibacterial soap and water prior to dressing change. Vashe 5.8 (oz) Discharge Instruction: Cleanse the wound with Vashe prior to applying a clean dressing using gauze sponges, not tissue or cotton balls. Peri-Wound Care Topical Gentamicin Discharge Instruction: As directed by physician Mupirocin Ointment Discharge Instruction: Apply Mupirocin (Bactroban) as instructed Primary Dressing Hydrofera Blue Ready Transfer Foam, 2.5x2.5 (in/in) Discharge Instruction: Apply directly to wound bed as directed Secondary Dressing ABD Pad, 5x9 Zetuvit Plus 4x8 in Discharge Instruction: Apply over primary dressing as directed. Secured With [employer], 4.5x3.1 (in/yd) Discharge Instruction: Secure with Kerlix as directed. 14M Medipore H Soft Cloth Surgical T ape, 4 x 10 (in/yd) Discharge Instruction: Secure with tape as directed. T contact cast otal Discharge Instruction: size 3 Compression [name], [name] ([id number]) [id number] Page 8 of 8 Compression Stockings Add-Ons Electronic Signature(s) Signed: 07/31/2023 5:47:40 PM By: Glenn Stall RN, BSN Entered By: [name] on 07/31/2023 13:58:04 -------------------------------------------------------------------------------- Vitals Details Patient Name: Date of Service: [name] [place], [name] 07/31/2023 1:45 PM Medical Record Number: [id number] Patient Account Number: [id number] Date of Birth/Sex:  Treating RN: 05/17/1971 (52 y.o. [name], [name] Primary Care : [name] Other Clinician: Referring : Treating /Extender: [name] in Treatment: 26 Vital Signs Time Taken: 13:50 Temperature (F): 98.3 Height (in): 78 Pulse (bpm): 87 Weight (lbs): 275 Respiratory Rate (breaths/min): 20 Body Mass Index (BMI): 31.8 Blood Pressure (mmHg): 115/82 Reference Range: 80 - 120 mg / dl Electronic Signature(s) Signed: 07/31/2023 5:47:40 PM By: Glenn Stall RN, BSN Entered By: [name] on 07/31/2023 13:53:42

## 2023-08-01 NOTE — Progress Notes (Signed)
[name], [name] ([id number]) [id number] Page 1 of 12 Visit Report for 07/31/2023 Chief Complaint Document Details Patient Name: Date of Service: [name] [place], [name] RO LD 07/31/2023 1:45 PM Medical Record Number: [id number] Patient Account Number: [id number] Date of Birth/Sex: Treating RN: 09/20/1971 (52 y.o. M) Primary Care Provider: [name] Other Clinician: Referring Provider: Treating Provider/Extender: [name] in Treatment: 26 Information Obtained from: Patient Chief Complaint 01/15/2023; left foot wound Electronic Signature(s) Signed: 07/31/2023 4:31:19 PM By: Geralyn Corwin DO Entered By: Geralyn Corwin on 07/31/2023 14:38:58 -------------------------------------------------------------------------------- Debridement Details Patient Name: Date of Service: [name] [place], [name] 07/31/2023 1:45 PM Medical Record Number: [id number] Patient Account Number: [id number] Date of Birth/Sex: Treating RN: [birth year] (52 y.o. [name] Primary Care Provider: [name] Other Clinician: Referring Provider: Treating Provider/Extender: [name] in Treatment: 26 Debridement Performed for Assessment: Wound #3 Left,Plantar Foot Performed By: Physician Geralyn Corwin, DO Debridement Type: Debridement Severity of Tissue Pre Debridement: Fat layer exposed Level of Consciousness (Pre-procedure): Awake and Alert Pre-procedure Verification/Time Out Yes - 14:00 Taken: Start Time: 14:01 Pain Control: Lidocaine 4% T opical Solution Percent of Wound Bed Debrided: 100% T Area Debrided (cm): otal 1.98 Tissue and other material debrided: Viable, Non-Viable, Callus, Slough, Subcutaneous, Skin: Dermis , Skin: Epidermis, Slough Level: Skin/Subcutaneous Tissue Debridement Description: Excisional Instrument: Curette Bleeding: Minimum Hemostasis Achieved: Pressure End Time: 14:04 Procedural Pain:  0 Post Procedural Pain: 0 Response to Treatment: Procedure was tolerated well Level of Consciousness (Post- Awake and Alert procedure): Post Debridement Measurements of Total Wound Length: (cm) 2.1 Width: (cm) 1.2 Depth: (cm) 0.4 Volume: (cm) 0.792 Character of Wound/Ulcer Post Debridement: Improved ALLEX, LAPOINT ([id number]) [id number] Page 2 of 12 Severity of Tissue Post Debridement: Fat layer exposed Post Procedure Diagnosis Same as Pre-procedure Electronic Signature(s) Signed: 07/31/2023 4:31:19 PM By: Geralyn Corwin DO Signed: 07/31/2023 5:47:40 PM By: Shawn Stall RN, BSN Entered By: Shawn Stall on 07/31/2023 14:04:32 -------------------------------------------------------------------------------- HPI Details Patient Name: Date of Service: [name] [place], [name] 07/31/2023 1:45 PM Medical Record Number: [id number] Patient Account Number: [id number] Date of Birth/Sex: Treating RN: September 24, 1971 (52 y.o. M) Primary Care Provider: [name] Other Clinician: Referring Provider: Treating Provider/Extender: [name] in Treatment: 26 History of Present Illness HPI Description: ADMISSION 01/11/2020 This is a 52 year old man with uncontrolled type 2 diabetes with a recent hemoglobin A1c earlier this year of 13.4. He is on insulin and glipizide. He does not take his blood sugars at home. He does have a follow-up with primary care later this month I believe on January 27. He tells [place] that roughly a month ago he was walking with a shoe with a hole in his foot. He took the shoe off and there was an open wound at roughly the left fourth met head. This has significant undermining and raised edges. He has not noticed any purulence he does not feel unwell. More recently he was taking skin off the bottom of his foot and has a superficial area on the left fifth met head. He has not been offloading this. The patient was in the ER on  12/20. They gave him Bactroban which she has been using on the wound and 10 days worth of doxycycline. No x-rays were done. He has not had vascular studies. He is also been using hydrogen peroxide. Past medical history type 2 diabetes uncontrolled, chronic systolic heart failure, coronary artery disease with a history of congestive heart failure with stents.  Hypertension hyperlipidemia and chronic renal insufficiency ABI in this clinic was 1.14 on the left. 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 He is uncertain whether he would be able to work if we put him on some form of restriction 1/19; he is generally doing quite well. Using silver alginate on the wounds. Things actually look better. He has a forefoot offloading boot which she seems to be compliant about. He has support at work to stay off his foot is much as possible which is gratifying. Culture I did last week showed a few Enterococcus faecalis. I am going to put him on Augmentin. I talked about ordering an x-ray in my note last week but that does not seem to have happened. We will review reorder the x-ray this week. 1/26; x-ray reordered last week was negative for osteomyelitis. We are using silver alginate on the wound on the third and fifth met heads on the left. He is using a Darco forefoot off loader 2/2; the area on the fifth met head is closed. Third met head is still open with tunneling depth and thick callus. 2/9; the area on the fifth met head remains closed however the third met head again has a small open area on presentation with tunneling in depth and surrounded by thick callus. This looks like a pressure issue. We have been using silver alginate 2/16; the area of the fifth metatarsal head remains closed however the area over the third metatarsal head again is a small open area but with some depth. I do not think this is changed much since last week. He is using Hydrofera Blue with forefoot off  loader. He is not able to use a total contact cast on the left leg because he needs his left leg at work [employer]]. Fortunately the wound does not look infected. I changed him to endoform today 2/26; the area of the fifth metatarsal head remains closed. The area of the third metatarsal head has an even smaller opening this time. I used endoform on this this looks improved. He is offloading this is much as he can and a forefoot off loader on the left. He cannot have a total contact cast because of work responsibilities 3/5; the area on the fifth metatarsal head remains closed the area on the third metatarsal head is also closed on the left foot. 01/15/2023 Mr. 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 He has been following for podiatry for several months for this issue. He is currently using wet-to-dry saline dressings. He currently denies signs of infection. Progresse note from 1/17; Patient presents with a 49-month history of nonhealing ulcer to the left foot secondary to diabetes and inability to offload well the area. He has had multiple debridements in the past to his feet bilaterally. He has had resection of the fourth left met head in the fifth toe Secondary to osteomyelitis. We discussed the importance of glycemic control for wound healing. Due to his blood glucose levels being elevated he is at high risk for infection and thus further amputation. He expressed understanding. He states he is supposed to be referred to an endocrinologist at [facility] however the  referral fell through. Unclear what happened. Offered a referral to endocrinology at  [facility]. Patient was agreeable.Furthermore we discussed the importance of aggressive offloading for his wound healing. This will be the most difficult part of the treatment plan for the patient to do. We discussed a total contact cast however he has declined that at this time. 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 He is currently using a surgical shoe with a peg assist. It does not fit well so we will give him a new one today. I do not think this is enough offloading. If he is not able to offload this area he will likely end up with a BKA. He is well aware of this. For now I recommended Medihoney and Hydrofera Blue for dressing changes. He will follow-up in our [place] office Since this is closer for him. SEIBERT, KEETER ([id number]) [id number] Page 3 of 12 1/26; patient presents for follow-up. Last clinic visit I had seen him in [place] and we transferred him to [place] since this is a closer location for him. Progress note above from that visit. Over the past week He has been using Medihoney and Hydrofera Blue to the wound bed. He has been using his surgical shoe with peg assist. He has no issues or complaints today. We discussed doing the total contact cast and he was agreeable to having this placed at next clinic visit. 2/2; patient presents for follow-up. He has been using Vashe wet-to-dry dressings. Plan is for the total contact cast today. 2/5; patient presents for follow-up. The cast was placed 3 days ago. He tolerated this well however had a lot of drainage. He currently denies signs of infection. 2/9; patient presents for follow-up. At last clinic visit we held off on replacing the cast due to drainage. He has been using Vashe wet-to-dry dressings and he is currently taking the oral antibiotics prescribed without issues. He declines a total contact cast today. We ran insurance  verification for skin substitute and due to cost patient declines having this placed. 2/16; patient presents for follow-up. He has decided not to follow-up with podiatry. He continues to decline the total contact cast. He has been using Vashe wet- to-dry dressings. He has not been wearing his surgical shoe with peg assist. He currently denies signs of infection. 3/1; patient presents for follow-up. He has been doing Vashe wet-to-dry dressings. He has a surgical shoe with peg assist. We discussed potentially doing a skin substitute for which she has been approved for by insurance. He knows the out-of-pocket cost of this and would still like to proceed with having this placed today. He denies the current total contact cast. 3/8; patient presents for follow-up. He states that the skin substitute came off after a few days and he has been using Vashe wet-to-dry dressings. He has mild odor to the wound bed on exam. 3/15; patient presents for follow-up. He has been using Vashe wet-to-dry dressings and started his course of Augmentin. He reports improvement in wound healing. He has no issues or complaints today. 3/21; patient presents for follow-up. He continues to use Vashe wet-to-dry dressings. He has no issues or complaints today. 3/28; patient presents for follow-up. He has been using Vashe wet-to-dry dressings. He has no issues or complaints today. He has been using his peg assist with surgical shoe. 4/11; patient presents for follow-up. He has been using Vashe wet-to-dry  dressings. The periwound is macerated. He has been using his peg assist with surgical shoe. He has no issues or complaints today. 4/25; patient presents for follow-up. Has been using Vashe wet-to-dry dressings. He has been using zinc oxide to the periwound. For offloading he has been using his peg assist in surgical shoe. He has no issues or complaints today. 5/9; patient presents for follow-up. He has been using Vashe wet-to-dry  dressings. He has no issues or complaints today. He has a surgical shoe with peg assist for offloading. 5/23; patient presents for follow up. He has been using Vashe to clean the wound bed and collagen and blast X with dressing changes. [place] the [place] 2 [place]. Wound is bigger. 5/31; patient presents for follow-up. He has been using Vashe wet-to-dry dressings. Wound is stable but with healthier granulation tissue. Patient would like to hold off on the total contact cast and try a knee scooter for offloading. 6/13; patient presents for follow-up. He has been using Vashe wet-to-dry dressings. He has been using a knee scooter for offloading however states he felt unstable on this and decided to buy an electric scooter. This arrived in the mail today. He would like to hold off on the left total contact cast. Wound is slightly smaller today. 6/27; patient presents for follow-up. He has been using Vashe wet-to-dry dressings. He is using an Art gallery manager for mobility to help relieve the pressure off the wound bed. Wound is smaller today. He wants to hold off on doing the total contact cast as he is showing signs of improvement with wound healing. 7/11; patient presents for follow-up. He has been using Vashe wet-to-dry dressings and his electric scooter for offloading. Wound is slightly smaller. 7/25; patient presents for follow up. He has been using Vashe wet-to-dry dressings and he reports using his electric scooter for offloading. Wound is larger. 7/30; patient presents for follow-up. He has been using Vashe wet-to-dry dressings. He has taken Augmentin as prescribed without issues. Overall wound has healthier granulation tissue. 8/1; patient presents for follow-up. He presents for obligatory cast change. He tolerated the cast well and has no issues or complaints today. We have been using antibiotic ointment with Hydrofera Blue under the cast. Wound is slightly smaller. Electronic  Signature(s) Signed: 07/31/2023 4:31:19 PM By: Geralyn Corwin DO Entered By: Geralyn Corwin on 07/31/2023 14:40:02 -------------------------------------------------------------------------------- Physical Exam Details Patient Name: Date of Service: [name] [place], [name] 07/31/2023 1:45 PM Medical Record Number: [id number] Patient Account Number: [id number] Date of Birth/Sex: Treating RN: 1971-06-19 (52 y.o. M) Primary Care Provider: [name] Other Clinician: Referring Provider: Treating Provider/Extender: [name] in Treatment: [address] [place] ([id number]) [id number] Page 4 of 12 Constitutional respirations regular, non-labored and within target range for patient.. Cardiovascular 2+ dorsalis pedis/posterior tibialis pulses. Psychiatric pleasant and cooperative. Notes Left foot: T the lateral aspect fifth met head there is an open wound with granulation tissue, nonviable tissue and callus. No erythema or increased warmth to o the periwound. Electronic Signature(s) Signed: 07/31/2023 4:31:19 PM By: Geralyn Corwin DO Entered By: Geralyn Corwin on 07/31/2023 14:40:50 -------------------------------------------------------------------------------- Physician Orders Details Patient Name: Date of Service: [name] [place], [name] 07/31/2023 1:45 PM Medical Record Number: [id number] Patient Account Number: [id number] Date of Birth/Sex: Treating RN: [birth year] (52 y.o. [name] Primary Care Provider: [name] Other Clinician: Referring Provider: Treating Provider/Extender: [name] in Treatment: 48 Verbal / Phone Orders: No Diagnosis Coding ICD-10  Coding Code Description [phone] 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.621 Type 2 diabetes mellitus with foot ulcer Z89.422 Acquired absence of other left toe(s) Follow-up  Appointments ppointment in 1 week. - Dr Mikey Bussing Room 7 0845 08/07/2023 *****CAST****** Return A ppointment in 2 weeks. - Dr. Mikey Bussing room 8 0800 08/14/2023 *****CAST****** Return A Anesthetic (In clinic) Topical Lidocaine 4% applied to wound bed Cellular or Tissue Based Products Cellular or Tissue Based Product Type: - apligraf and grafix 50% coinsurance 02/28/23-apply 1st Grafix 03/07/2023 HOLD THIS WEEK. Bathing/ Shower/ Hygiene May shower with protection but do not get wound dressing(s) wet. Protect dressing(s) with water repellant cover (for example, large plastic bag) or a cast cover and may then take shower. Off-Loading Total Contact Cast to Left Lower Extremity - size 3 heel cup to toes. Wound Treatment Wound #3 - Foot Wound Laterality: Plantar, Left Cleanser: Soap and Water 1 x Per Week/30 Days Discharge Instructions: May shower and wash wound with dial antibacterial soap and water prior to dressing change. Cleanser: Vashe 5.8 (oz) 1 x Per Week/30 Days Discharge Instructions: Cleanse the wound with Vashe prior to applying a clean dressing using gauze sponges, not tissue or cotton balls. Topical: Gentamicin 1 x Per Week/30 Days Discharge Instructions: As directed by physician Topical: Mupirocin Ointment 1 x Per Week/30 Days Discharge Instructions: Apply Mupirocin (Bactroban) as instructed DERRICH, GABY ([id number]) [id number] Page 5 of 12 Prim Dressing: Hydrofera Blue Ready Transfer Foam, 2.5x2.5 (in/in) 1 x Per Week/30 Days ary Discharge Instructions: Apply directly to wound bed as directed Secondary Dressing: ABD Pad, 5x9 1 x Per Week/30 Days Secondary Dressing: Zetuvit Plus 4x8 in 1 x Per Week/30 Days Discharge Instructions: Apply over primary dressing as directed. Secured With: [employer], 4.5x3.1 (in/yd) 1 x Per Week/30 Days Discharge Instructions: Secure with Kerlix as directed. Secured With: 76M Medipore H Soft Cloth Surgical T ape,  4 x 10 (in/yd) 1 x Per Week/30 Days Discharge Instructions: Secure with tape as directed. Secured With: T contact cast 1 x Per Week/30 Days otal Discharge Instructions: size 3 Electronic Signature(s) Signed: 07/31/2023 4:31:19 PM By: Geralyn Corwin DO Entered By: Geralyn Corwin on 07/31/2023 14:41:00 -------------------------------------------------------------------------------- Problem List Details Patient Name: Date of Service: [name] [place], [name] 07/31/2023 1:45 PM Medical Record Number: [id number] Patient Account Number: [id number] Date of Birth/Sex: Treating RN: 01/15/1971 (52 y.o. [name] Primary Care Provider: [name] Other Clinician: Referring Provider: Treating Provider/Extender: [name] in Treatment: 26 Active Problems ICD-10 Encounter Code Description Active Date MDM Diagnosis [phone] Non-pressure chronic ulcer of other part of left foot with fat layer exposed 01/24/2023 No Yes E11.621 Type 2 diabetes mellitus with foot ulcer 01/24/2023 No Yes Z89.422 Acquired absence of other left toe(s) 01/24/2023 No Yes Inactive Problems Resolved Problems Electronic Signature(s) Signed: 07/31/2023 4:31:19 PM By: Geralyn Corwin DO Entered By: Geralyn Corwin on 07/31/2023 14:38:37 [name] ([id number]) [id number] Page 6 of 12 -------------------------------------------------------------------------------- Progress Note Details Patient Name: Date of Service: [name] [place], [name] RO LD 07/31/2023 1:45 PM Medical Record Number: [id number] Patient Account Number: [id number] Date of Birth/Sex: Treating RN: [birth year] (53 y.o. M) Primary Care Provider: [name] Other Clinician: Referring Provider: Treating Provider/Extender: [name] in Treatment: 26 Subjective Chief Complaint Information obtained from Patient 01/15/2023; left foot wound History of Present Illness  (HPI) ADMISSION 01/11/2020 This is a 52 year old man with uncontrolled type 2 diabetes with a recent hemoglobin A1c  earlier this year of 13.4. He is on insulin and glipizide. He does not take his blood sugars at home. He does have a follow-up with primary care later this month I believe on January 27. He tells [place] that roughly a month ago he was walking with a shoe with a hole in his foot. He took the shoe off and there was an open wound at roughly the left fourth met head. This has significant undermining and raised edges. He has not noticed any purulence he does not feel unwell. More recently he was taking skin off the bottom of his foot and has a superficial area on the left fifth met head. He has not been offloading this. The patient was in the ER on 12/20. They gave him Bactroban which she has been using on the wound and 10 days worth of doxycycline. No x-rays were done. He has not had vascular studies. He is also been using hydrogen peroxide. Past medical history type 2 diabetes uncontrolled, chronic systolic heart failure, coronary artery disease with a history of congestive heart failure with stents. Hypertension hyperlipidemia and chronic renal insufficiency ABI in this clinic was 1.14 on the left. 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 He is uncertain whether he would be able to work if we put him on some form of restriction 1/19; he is generally doing quite well. Using silver alginate on the wounds. Things actually look better. He has a forefoot offloading boot which she seems to be compliant about. He has support at work to stay off his foot is much as possible which is gratifying. Culture I did last week showed a few Enterococcus faecalis. I am going to put him on Augmentin. I talked about ordering an x-ray in my note last week but that does not seem to have happened. We will review reorder the x-ray this week. 1/26; x-ray reordered last week was negative  for osteomyelitis. We are using silver alginate on the wound on the third and fifth met heads on the left. He is using a Darco forefoot off loader 2/2; the area on the fifth met head is closed. Third met head is still open with tunneling depth and thick callus. 2/9; the area on the fifth met head remains closed however the third met head again has a small open area on presentation with tunneling in depth and surrounded by thick callus. This looks like a pressure issue. We have been using silver alginate 2/16; the area of the fifth metatarsal head remains closed however the area over the third metatarsal head again is a small open area but with some depth. I do not think this is changed much since last week. He is using Hydrofera Blue with forefoot off loader. He is not able to use a total contact cast on the left leg because he needs his left leg at work [employer]]. Fortunately the wound does not look infected. I changed him to endoform today 2/26; the area of the fifth metatarsal head remains closed. The area of the third metatarsal head has an even smaller opening this time. I used endoform on this this looks improved. He is offloading this is much as he can and a forefoot off loader on the left. He cannot have a total contact cast because of work responsibilities 3/5; the area on the fifth metatarsal head remains closed the area on the third metatarsal head is also closed on the left foot. 01/15/2023 Mr.  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 He has been following for podiatry for several months for this issue. He is currently using wet-to-dry  saline dressings. He currently denies signs of infection. Progresse note from 1/17; Patient presents with a 38-month history of nonhealing ulcer to the left foot secondary to diabetes and inability to offload well the area. He has had multiple debridements in the past to his feet bilaterally. He has had resection of the fourth left met head in the fifth toe Secondary to osteomyelitis. We discussed the importance of glycemic control for wound healing. Due to his blood glucose levels being elevated he is at high risk for infection and thus further amputation. He expressed understanding. He states he is supposed to be referred to an endocrinologist at [facility] however the referral fell through. Unclear what happened. Offered a referral to endocrinology at [facility]. Patient was agreeable.Furthermore we discussed the importance of aggressive offloading for his wound healing. This will be the most difficult part of the treatment plan for the patient to do. We discussed a total contact cast however he has declined that at this time. 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 He is currently using a surgical shoe with a peg assist. It does not fit well so we will give him a new one today. I do not think this is enough offloading. If he is not able to offload this area he will likely end up with a BKA. He is well aware of this. For now I recommended Medihoney and Hydrofera Blue for dressing changes. He will follow-up in our [place] office Since this is closer for him. 1/26; patient presents for follow-up. Last clinic visit I had seen him in [place] and we transferred him to [place] since this is a closer location for him. Progress note above from that visit. Over the past week He has been using Medihoney and Hydrofera Blue to the wound bed. He has been using his surgical shoe with peg assist. He has no issues or complaints today. We  discussed doing the total contact cast and he was agreeable to having this placed at next clinic visit. 2/2; patient presents for follow-up. He has been using Vashe wet-to-dry dressings. Plan is for the total contact cast today. 2/5; patient presents for follow-up. The cast was placed 3 days ago. He tolerated this well however had a lot of drainage. He currently denies signs of infection. 2/9; patient presents for follow-up. At last clinic visit we held off on replacing the cast due to drainage. He has been using Vashe wet-to-dry dressings and he is JANAI, BRANNIGAN ([id number]) [id number] Page 7 of 12 currently taking the oral antibiotics prescribed without issues. He declines a total contact cast today. We ran insurance verification for skin substitute and due to cost patient declines having this placed. 2/16; patient presents for follow-up. He has decided not to follow-up with podiatry. He continues to decline the total contact cast. He has been using Vashe wet- to-dry dressings. He has not been wearing his surgical shoe with peg assist. He currently denies signs of  infection. 3/1; patient presents for follow-up. He has been doing Vashe wet-to-dry dressings. He has a surgical shoe with peg assist. We discussed potentially doing a skin substitute for which she has been approved for by insurance. He knows the out-of-pocket cost of this and would still like to proceed with having this placed today. He denies the current total contact cast. 3/8; patient presents for follow-up. He states that the skin substitute came off after a few days and he has been using Vashe wet-to-dry dressings. He has mild odor to the wound bed on exam. 3/15; patient presents for follow-up. He has been using Vashe wet-to-dry dressings and started his course of Augmentin. He reports improvement in wound healing. He has no issues or complaints today. 3/21; patient presents for follow-up. He  continues to use Vashe wet-to-dry dressings. He has no issues or complaints today. 3/28; patient presents for follow-up. He has been using Vashe wet-to-dry dressings. He has no issues or complaints today. He has been using his peg assist with surgical shoe. 4/11; patient presents for follow-up. He has been using Vashe wet-to-dry dressings. The periwound is macerated. He has been using his peg assist with surgical shoe. He has no issues or complaints today. 4/25; patient presents for follow-up. Has been using Vashe wet-to-dry dressings. He has been using zinc oxide to the periwound. For offloading he has been using his peg assist in surgical shoe. He has no issues or complaints today. 5/9; patient presents for follow-up. He has been using Vashe wet-to-dry dressings. He has no issues or complaints today. He has a surgical shoe with peg assist for offloading. 5/23; patient presents for follow up. He has been using Vashe to clean the wound bed and collagen and blast X with dressing changes. [place] the [place] 2 [place]. Wound is bigger. 5/31; patient presents for follow-up. He has been using Vashe wet-to-dry dressings. Wound is stable but with healthier granulation tissue. Patient would like to hold off on the total contact cast and try a knee scooter for offloading. 6/13; patient presents for follow-up. He has been using Vashe wet-to-dry dressings. He has been using a knee scooter for offloading however states he felt unstable on this and decided to buy an electric scooter. This arrived in the mail today. He would like to hold off on the left total contact cast. Wound is slightly smaller today. 6/27; patient presents for follow-up. He has been using Vashe wet-to-dry dressings. He is using an Art gallery manager for mobility to help relieve the pressure off the wound bed. Wound is smaller today. He wants to hold off on doing the total contact cast as he is showing signs of improvement with wound  healing. 7/11; patient presents for follow-up. He has been using Vashe wet-to-dry dressings and his electric scooter for offloading. Wound is slightly smaller. 7/25; patient presents for follow up. He has been using Vashe wet-to-dry dressings and he reports using his electric scooter for offloading. Wound is larger. 7/30; patient presents for follow-up. He has been using Vashe wet-to-dry dressings. He has taken Augmentin as prescribed without issues. Overall wound has healthier granulation tissue. 8/1; patient presents for follow-up. He presents for obligatory cast change. He tolerated the cast well and has no issues or complaints today. We have been using antibiotic ointment with Hydrofera Blue under the cast. Wound is slightly smaller. Patient History Information obtained from Patient. Family History Heart Disease - Mother,Father, No family history of Cancer, Diabetes, Hereditary Spherocytosis, Hypertension, Kidney Disease, Lung  Disease, Seizures, Stroke, Thyroid Problems, Tuberculosis. Social History Never smoker, Marital Status - Single, Alcohol Use - Rarely, Drug Use - No History, Caffeine Use - Rarely. 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 17 Denies history of Angina, Arrhythmia, Deep Vein Thrombosis, Hypotension, Peripheral Arterial Disease, Peripheral Venous Disease, Phlebitis, Vasculitis Gastrointestinal Denies history of Cirrhosis , Colitis,  Crohns, Hepatitis A, Hepatitis B, Hepatitis C Endocrine Patient has history of Type II Diabetes Genitourinary Denies history of End Stage Renal Disease Immunological Denies history of Lupus Erythematosus, Raynauds, Scleroderma Integumentary (Skin) Denies history of History of Burn Musculoskeletal Denies history of Gout, Rheumatoid Arthritis, Osteoarthritis, Osteomyelitis Neurologic Patient has history of Neuropathy ADONAI, SELSOR ([id number]) [id number] Page 8 of 12 Denies history of Dementia, Quadriplegia, Paraplegia, Seizure Disorder Oncologic Denies history of Received Chemotherapy, Received Radiation Psychiatric Denies history of Anorexia/bulimia, Confinement Anxiety Hospitalization/Surgery History - Heart Cath in 2018. Medical A Surgical History Notes nd Cardiovascular Ischemic Cardiomyopathy Genitourinary Renal Insufficiency Objective Constitutional respirations regular, non-labored and within target range for patient.. Vitals Time Taken: 1:50 PM, Height: 78 in, Weight: 275 lbs, BMI: 31.8, Temperature: 98.3 F, Pulse: 87 bpm, Respiratory Rate: 20 breaths/min, Blood Pressure: 115/82 mmHg. Cardiovascular 2+ dorsalis pedis/posterior tibialis pulses. Psychiatric pleasant and cooperative. General Notes: Left foot: T the lateral aspect fifth met head there is an open wound with granulation tissue, nonviable tissue and callus. No erythema or o increased warmth to the periwound. Integumentary (Hair, Skin) Wound #3 status is Open. Original cause of wound was Gradually Appeared. The date acquired was: 10/01/2022. The wound has been in treatment 26 weeks. The wound is located on the Left,Plantar Foot. The wound measures 2.1cm length x 1.2cm width x 0.4cm depth; 1.979cm^2 area and 0.792cm^3 volume. There is Fat Layer (Subcutaneous Tissue) exposed. There is no tunneling or undermining noted. There is a medium amount of serosanguineous drainage noted.  The wound margin is thickened. There is large (67-100%) red granulation within the wound bed. There is a small (1-33%) amount of necrotic tissue within the wound bed including Adherent Slough. The periwound skin appearance exhibited: Callus. The periwound skin appearance did not exhibit: Crepitus, Excoriation, Induration, Rash, Scarring, Dry/Scaly, Maceration, Atrophie Blanche, Cyanosis, Ecchymosis, Hemosiderin Staining, Mottled, Pallor, Rubor, Erythema. Periwound temperature was noted as No Abnormality. 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 I debrided nonviable tissue. He tolerated the total contact cast well with no issues. I recommended continuing the course with antibiotic ointment and Hydrofera Blue. Follow-up in 1 week. He knows to call with any questions or concerns. Procedures Wound #3 Pre-procedure diagnosis of Wound #3 is a Diabetic Wound/Ulcer of the Lower Extremity located on the Left,Plantar Foot .Severity of Tissue Pre Debridement is: Fat layer exposed. There was a Excisional Skin/Subcutaneous Tissue Debridement with a total area of 1.98 sq cm performed by Geralyn Corwin, DO. With the following instrument(s): Curette to remove Viable and Non-Viable tissue/material. Material removed includes Callus, Subcutaneous Tissue, Slough, Skin: Dermis, and Skin: Epidermis after achieving pain control using  Lidocaine 4% Topical Solution. A time out was conducted at 14:00, prior to the start of the procedure. A Minimum amount of bleeding was controlled with Pressure. The procedure was tolerated well with a pain level of 0 throughout and a pain level of 0 following the procedure. Post Debridement Measurements: 2.1cm length x 1.2cm width x 0.4cm depth; 0.792cm^3 volume. Character of Wound/Ulcer Post Debridement is  improved. Severity of Tissue Post Debridement is: Fat layer exposed. Post procedure Diagnosis Wound #3: Same as Pre-Procedure Pre-procedure diagnosis of Wound #3 is a Diabetic Wound/Ulcer of the Lower Extremity located on the Left,Plantar Foot . There was a T Research scientist (life sciences) otal Procedure by Geralyn Corwin, DO. Post procedure Diagnosis Wound #3: Same as Pre-Procedure DAVIEON, STOCKHAM ([id number]) [id number] Page 9 of 12 Notes: size 3. Plan Follow-up Appointments: Return Appointment in 1 week. - Dr Mikey Bussing Room 7 [phone] 08/07/2023 *****CAST****** Return Appointment in 2 weeks. - Dr. Mikey Bussing room 8 0800 08/14/2023 *****CAST****** Anesthetic: (In clinic) Topical Lidocaine 4% applied to wound bed Cellular or Tissue Based Products: Cellular or Tissue Based Product Type: - apligraf and grafix 50% coinsurance 02/28/23-apply 1st Grafix 03/07/2023 HOLD THIS WEEK. Bathing/ Shower/ Hygiene: May shower with protection but do not get wound dressing(s) wet. Protect dressing(s) with water repellant cover (for example, large plastic bag) or a cast cover and may then take shower. Off-Loading: T Contact Cast to Left Lower Extremity - size 3 heel cup to toes. otal WOUND #3: - Foot Wound Laterality: Plantar, Left Cleanser: Soap and Water 1 x Per Week/30 Days Discharge Instructions: May shower and wash wound with dial antibacterial soap and water prior to dressing change. Cleanser: Vashe 5.8 (oz) 1 x Per Week/30 Days Discharge Instructions: Cleanse the wound with Vashe prior to applying a clean dressing using gauze sponges, not tissue or cotton balls. Topical: Gentamicin 1 x Per Week/30 Days Discharge Instructions: As directed by physician Topical: Mupirocin Ointment 1 x Per Week/30 Days Discharge Instructions: Apply Mupirocin (Bactroban) as instructed Prim Dressing: Hydrofera Blue Ready Transfer Foam, 2.5x2.5 (in/in) 1 x Per Week/30 Days ary Discharge Instructions: Apply directly to  wound bed as directed Secondary Dressing: ABD Pad, 5x9 1 x Per Week/30 Days Secondary Dressing: Zetuvit Plus 4x8 in 1 x Per Week/30 Days Discharge Instructions: Apply over primary dressing as directed. Secured With: [employer], 4.5x3.1 (in/yd) 1 x Per Week/30 Days Discharge Instructions: Secure with Kerlix as directed. Secured With: 42M Medipore H Soft Cloth Surgical T ape, 4 x 10 (in/yd) 1 x Per Week/30 Days Discharge Instructions: Secure with tape as directed. Secured With: T contact cast 1 x Per Week/30 Days otal Discharge Instructions: size 3 1. In office sharp debridement 2. Hydrofera Blue and antibiotic ointment 3. T contact cast placed in standard fashion todayleft lower extremity otal 4. Follow-up in 1 week Electronic Signature(s) Signed: 07/31/2023 4:31:19 PM By: Geralyn Corwin DO Entered By: Geralyn Corwin on 07/31/2023 14:42:13 -------------------------------------------------------------------------------- HxROS Details Patient Name: Date of Service: [name] [place], [name] 07/31/2023 1:45 PM Medical Record Number: [id number] Patient Account Number: [id number] Date of Birth/Sex: Treating RN: [birth year] (52 y.o. M) Primary Care Provider: [name] Other Clinician: Referring Provider: Treating Provider/Extender: [name] in Treatment: 26 Information Obtained From Patient Eyes Medical History: Negative for: Cataracts; Glaucoma; Optic Neuritis Ear/Nose/Mouth/Throat Medical History: Positive for: Chronic sinus problems/congestion SWANSON, FARNELL ([id number]) [id number] Page 10 of 12 Negative for: Middle ear problems Hematologic/Lymphatic Medical History: Negative for: Anemia; Hemophilia; Human Immunodeficiency Virus; Lymphedema; Sickle Cell  Disease Respiratory Medical History: Negative for: Aspiration; Asthma; Chronic Obstructive Pulmonary Disease (COPD); Pneumothorax; Sleep Apnea;  Tuberculosis Cardiovascular Medical History: Positive for: Congestive Heart Failure; Coronary Artery Disease; Hypertension; Myocardial Infarction - age 50 Negative for: Angina; Arrhythmia; Deep Vein Thrombosis; Hypotension; Peripheral Arterial Disease; Peripheral Venous Disease; Phlebitis; Vasculitis Past Medical History Notes: Ischemic Cardiomyopathy Gastrointestinal Medical History: Negative for: Cirrhosis ; Colitis; Crohns; Hepatitis A; Hepatitis B; Hepatitis C Endocrine Medical History: Positive for: Type II Diabetes Time with diabetes: ten years Treated with: Insulin Blood sugar tested every day: No Genitourinary Medical History: Negative for: End Stage Renal Disease Past Medical History Notes: Renal Insufficiency Immunological Medical History: Negative for: Lupus Erythematosus; Raynauds; Scleroderma Integumentary (Skin) Medical History: Negative for: History of Burn Musculoskeletal Medical History: Negative for: Gout; Rheumatoid Arthritis; Osteoarthritis; Osteomyelitis Neurologic Medical History: Positive for: Neuropathy Negative for: Dementia; Quadriplegia; Paraplegia; Seizure Disorder Oncologic Medical History: Negative for: Received Chemotherapy; Received Radiation Psychiatric Medical History: Negative for: Anorexia/bulimia; Confinement Anxiety HBO Extended History Items Ear/Nose/Mouth/Throat: Chronic sinus problems/congestion Immunizations Pneumococcal VaccineTAEDYN, GLASSCOCK ([id number]) [id number] Page 11 of 12 Received Pneumococcal Vaccination: No Implantable Devices None Hospitalization / Surgery History Type of Hospitalization/Surgery Heart Cath in 2018 Family and Social History Cancer: No; Diabetes: No; Heart Disease: Yes - Mother,Father; Hereditary Spherocytosis: No; Hypertension: No; Kidney Disease: No; Lung Disease: No; Seizures: No; Stroke: No; Thyroid Problems: No; Tuberculosis: No; Never smoker; Marital Status -  Single; Alcohol Use: Rarely; Drug Use: No History; Caffeine Use: Rarely; Financial Concerns: No; Food, Clothing or Shelter Needs: No; Support System Lacking: No; Transportation Concerns: No Electronic Signature(s) Signed: 07/31/2023 4:31:19 PM By: Geralyn Corwin DO Entered By: Geralyn Corwin on 07/31/2023 14:40:13 -------------------------------------------------------------------------------- Total Contact Cast Details Patient Name: Date of Service: [name] [place], [name] RO LD 07/31/2023 1:45 PM Medical Record Number: [id number] Patient Account Number: [id number] Date of Birth/Sex: Treating RN: 11-07-1971 (52 y.o. [name] Primary Care Provider: [name] Other Clinician: Referring Provider: Treating Provider/Extender: [name] in Treatment: 26 T Contact Cast Applied for Wound Assessment: otal Wound #3 Left,Plantar Foot Performed By: Physician Geralyn Corwin, DO Post Procedure Diagnosis Same as Pre-procedure Notes size 3 Electronic Signature(s) Signed: 07/31/2023 4:31:19 PM By: Geralyn Corwin DO Signed: 07/31/2023 5:47:40 PM By: Shawn Stall RN, BSN Entered By: Shawn Stall on 07/31/2023 13:58:34 -------------------------------------------------------------------------------- SuperBill Details Patient Name: Date of Service: [name] IN, [name] 07/31/2023 Medical Record Number: [id number] Patient Account Number: [id number] Date of Birth/Sex: Treating RN: 1971/05/18 (52 y.o. [name] Primary Care Provider: [name] Other Clinician: Referring Provider: Treating Provider/Extender: [name] in Treatment: 26 Diagnosis Coding ICD-10 Codes Code Description [id number] 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.621 Type 2 diabetes mellitus with foot ulcer Z89.422 Acquired absence of other left toe(s) KETIH, GOODIE ([id number])  [id number] Page 12 of 12 Facility Procedures : CPT4 Code: [id number] Description: 11042 - DEB SUBQ TISSUE 20 SQ CM/< ICD-10 Diagnosis Description L97.522 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.621 Type 2 diabetes mellitus with foot ulcer Modifier: Quantity: 1 Physician Procedures : CPT4 Code Description Modifier [id number] 11042 - WC PHYS SUBQ TISS 20 SQ CM ICD-10 Diagnosis Description L97.522 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.621 Type 2 diabetes mellitus with foot ulcer Quantity: 1 Electronic Signature(s) Signed: 07/31/2023 4:31:19 PM By: Geralyn Corwin DO Entered By: Geralyn Corwin on 07/31/2023 14:42:22

## 2023-08-07 ENCOUNTER — Encounter (HOSPITAL_BASED_OUTPATIENT_CLINIC_OR_DEPARTMENT_OTHER): Payer: 59 | Admitting: Internal Medicine

## 2023-08-07 ENCOUNTER — Ambulatory Visit: Payer: 59 | Admitting: Critical Care Medicine

## 2023-08-07 DIAGNOSIS — E1122 Type 2 diabetes mellitus with diabetic chronic kidney disease: Secondary | ICD-10-CM | POA: Diagnosis not present

## 2023-08-07 DIAGNOSIS — L97522 Non-pressure chronic ulcer of other part of left foot with fat layer exposed: Secondary | ICD-10-CM

## 2023-08-07 DIAGNOSIS — I130 Hypertensive heart and chronic kidney disease with heart failure and stage 1 through stage 4 chronic kidney disease, or unspecified chronic kidney disease: Secondary | ICD-10-CM | POA: Diagnosis not present

## 2023-08-07 DIAGNOSIS — Z89422 Acquired absence of other left toe(s): Secondary | ICD-10-CM | POA: Diagnosis not present

## 2023-08-07 DIAGNOSIS — E11621 Type 2 diabetes mellitus with foot ulcer: Secondary | ICD-10-CM | POA: Diagnosis not present

## 2023-08-07 DIAGNOSIS — I2510 Atherosclerotic heart disease of native coronary artery without angina pectoris: Secondary | ICD-10-CM | POA: Diagnosis not present

## 2023-08-07 DIAGNOSIS — E785 Hyperlipidemia, unspecified: Secondary | ICD-10-CM | POA: Diagnosis not present

## 2023-08-07 DIAGNOSIS — I252 Old myocardial infarction: Secondary | ICD-10-CM | POA: Diagnosis not present

## 2023-08-07 DIAGNOSIS — Z794 Long term (current) use of insulin: Secondary | ICD-10-CM | POA: Diagnosis not present

## 2023-08-07 DIAGNOSIS — I5042 Chronic combined systolic (congestive) and diastolic (congestive) heart failure: Secondary | ICD-10-CM | POA: Diagnosis not present

## 2023-08-07 DIAGNOSIS — N189 Chronic kidney disease, unspecified: Secondary | ICD-10-CM | POA: Diagnosis not present

## 2023-08-07 DIAGNOSIS — Z955 Presence of coronary angioplasty implant and graft: Secondary | ICD-10-CM | POA: Diagnosis not present

## 2023-08-07 NOTE — Progress Notes (Signed)
JASAI, SANDELIN ([id number]) [id number] Page 1 of 8 Visit Report for 08/07/2023 Arrival Information Details Patient Name: Date of Service: [name] [place], [name] RO LD 08/07/2023 8:00 A M Medical Record Number: [id number] Patient Account Number: [id number] Date of Birth/Sex: Treating RN: 12/06/1971 (52 y.o. [name], [name] Primary Care : [name] Other Clinician: Referring : Treating /Extender: [name] in Treatment: 27 Visit Information History Since Last Visit Added or deleted any medications: No Patient Arrived: Ambulatory Any new allergies or adverse reactions: No Arrival Time: 07:52 Had a fall or experienced change in No Accompanied By: self activities of daily living that may affect Transfer Assistance: None risk of falls: Patient Identification Verified: Yes Signs or symptoms of abuse/neglect since last visito No Secondary Verification Process Completed: Yes Hospitalized since last visit: No Patient Requires Transmission-Based Precautions: No Implantable device outside of the clinic excluding No Patient Has Alerts: Yes cellular tissue based products placed in the center Patient Alerts: 08/2021 ABI:1.2 TBI 0.79 since last visit: Has Dressing in Place as Prescribed: Yes Has Footwear/Offloading in Place as Prescribed: Yes Left: T Contact Cast otal Pain Present Now: No Electronic Signature(s) Signed: 08/07/2023 12:05:26 PM By: Glenn Stall RN, BSN Entered By: [name] on 08/07/2023 07:52:26 -------------------------------------------------------------------------------- Encounter Discharge Information Details Patient Name: Date of Service: [name] [place], [name] 08/07/2023 8:00 A M Medical Record Number: [id number] Patient Account Number: [id number] Date of Birth/Sex: Treating RN: 06/13/1971 (52 y.o. [name] Primary Care : [name] Other Clinician: Referring  : Treating /Extender: [name] in Treatment: 27 Encounter Discharge Information Items Post Procedure Vitals Discharge Condition: Stable Temperature (F): 97.6 Ambulatory Status: Ambulatory Pulse (bpm): 81 Discharge Destination: Home Respiratory Rate (breaths/min): 20 Transportation: Private Auto Blood Pressure (mmHg): 104/74 Accompanied By: self Schedule Follow-up Appointment: Yes Clinical Summary of Care: Electronic Signature(s) Signed: 08/07/2023 12:05:26 PM By: Glenn Stall RN, BSN Entered By: [name] on 08/07/2023 08:48:15 [name] ([id number]) [id number] Page 2 of 8 -------------------------------------------------------------------------------- Lower Extremity Assessment Details Patient Name: Date of Service: [name] [place], [name] RO LD 08/07/2023 8:00 A M Medical Record Number: [id number] Patient Account Number: [id number] Date of Birth/Sex: Treating RN: 14-May-1971 (52 y.o. [name] Primary Care : [name] Other Clinician: Referring : Treating /Extender: [name] in Treatment: 27 Edema Assessment Assessed: [name]: Yes] [name]: No] Edema: [Left: N] [Right: o] Calf Left: Right: Point of Measurement: 43 cm From Medial Instep 37 cm Ankle Left: Right: Point of Measurement: 9 cm From Medial Instep 21 cm Vascular Assessment Pulses: Dorsalis Pedis Palpable: [Left:Yes] Extremity colors, hair growth, and conditions: Extremity Color: [Left:Normal] Hair Growth on Extremity: [Left:Yes] Temperature of Extremity: [Left:Warm] Capillary Refill: [Left:< 3 seconds] Dependent Rubor: [Left:No] Blanched when Elevated: [Left:No No] Toe Nail Assessment Left: Right: Thick: No Discolored: No Deformed: No Improper Length and Hygiene: No Electronic Signature(s) Signed: 08/07/2023 12:05:26 PM By: Glenn Stall RN, BSN Entered By: [name] on 08/07/2023 08:02:10 -------------------------------------------------------------------------------- Multi Wound Chart Details Patient Name: Date of Service: [name] [place], [name] 08/07/2023 8:00 A M Medical Record Number: [id number] Patient Account Number: [id number] Date of Birth/Sex: Treating RN: [birth year] (52 y.o. M) Primary Care : [name] Other Clinician: Referring : Treating /Extender: [name] in Treatment: 27 Vital Signs Height(in): 78 Pulse(bpm): 81 Weight(lbs): 275 Blood Pressure(mmHg): 104/74 Body Mass Index(BMI): 31.8 Temperature(F): 97.6 [name] ([id number]) [id number] Page 3 of 8 Respiratory Rate(breaths/min): 20 [3:Photos:] [N/A:N/A] Left,  Plantar Foot N/A N/A Wound Location: Gradually Appeared N/A N/A Wounding Event: Diabetic Wound/Ulcer of the Lower N/A N/A Primary Etiology: Extremity Chronic sinus problems/congestion, N/A N/A Comorbid History: Congestive Heart Failure, Coronary Artery Disease, Hypertension, Myocardial Infarction, Type II Diabetes, Neuropathy 10/01/2022 N/A N/A Date Acquired: 8 N/A N/A Weeks of Treatment: Open N/A N/A Wound Status: No N/A N/A Wound Recurrence: Yes N/A N/A Pending [name] on Presentation: 2.1x0.9x0.2 N/A N/A Measurements L x W x D (cm) 1.484 N/A N/A A (cm) : rea 0.297 N/A N/A Volume (cm) : 62.20% N/A N/A % Reduction in A rea: 62.20% N/A N/A % Reduction in Volume: Grade 1 N/A N/A Classification: Medium N/A N/A Exudate A mount: Serosanguineous N/A N/A Exudate Type: red, brown N/A N/A Exudate Color: Thickened N/A N/A Wound Margin: Large (67-100%) N/A N/A Granulation A mount: Red N/A N/A Granulation Quality: None Present (0%) N/A N/A Necrotic A mount: Fat Layer (Subcutaneous Tissue): Yes N/A N/A Exposed Structures: Fascia: No Tendon: No Muscle: No Joint: No Bone: No Medium (34-66%) N/A  N/A Epithelialization: Debridement - Excisional N/A N/A Debridement: Pre-procedure Verification/Time Out 08:40 N/A N/A Taken: Lidocaine 4% Topical Solution N/A N/A Pain Control: Callus, Subcutaneous, Slough N/A N/A Tissue Debrided: Skin/Subcutaneous Tissue N/A N/A Level: 0.99 N/A N/A Debridement A (sq cm): rea Curette N/A N/A Instrument: Minimum N/A N/A Bleeding: Pressure N/A N/A Hemostasis A chieved: 0 N/A N/A Procedural Pain: 0 N/A N/A Post Procedural Pain: Procedure was tolerated well N/A N/A Debridement Treatment Response: 2.1x0.6x0.2 N/A N/A Post Debridement Measurements L x W x D (cm) 0.198 N/A N/A Post Debridement Volume: (cm) Callus: Yes N/A N/A Periwound Skin Texture: Excoriation: No Induration: No Crepitus: No Rash: No Scarring: No Maceration: No N/A N/A Periwound Skin Moisture: Dry/Scaly: No Atrophie Blanche: No N/A N/A Periwound Skin Color: Cyanosis: No Ecchymosis: No Erythema: No Hemosiderin Staining: No Mottled: No Pallor: No Rubor: No No Abnormality N/A N/A Temperature: Debridement N/A N/A Procedures Performed: T [address] CAMARION, DILLOW ([id number]) [id number] Page 4 of 8 Treatment Notes Wound #3 (Foot) Wound Laterality: Plantar, Left Cleanser Soap and Water Discharge Instruction: May shower and wash wound with dial antibacterial soap and water prior to dressing change. Vashe 5.8 (oz) Discharge Instruction: Cleanse the wound with Vashe prior to applying a clean dressing using gauze sponges, not tissue or cotton balls. Peri-Wound Care Topical Gentamicin Discharge Instruction: As directed by physician Mupirocin Ointment Discharge Instruction: Apply Mupirocin (Bactroban) as instructed Primary Dressing Hydrofera Blue Ready Transfer Foam, 2.5x2.5 (in/in) Discharge Instruction: Apply directly to wound bed as directed Secondary Dressing ABD Pad, 5x9 Zetuvit Plus 4x8 in Discharge Instruction:  Apply over primary dressing as directed. Secured With [employer], 4.5x3.1 (in/yd) Discharge Instruction: Secure with Kerlix as directed. 84M Medipore H Soft Cloth Surgical T ape, 4 x 10 (in/yd) Discharge Instruction: Secure with tape as directed. T contact cast otal Discharge Instruction: size 3 Compression Wrap Compression Stockings Add-Ons Electronic Signature(s) Signed: 08/07/2023 10:14:37 AM By: Geralyn Corwin DO Entered By: Geralyn Corwin on 08/07/2023 09:17:59 -------------------------------------------------------------------------------- Multi-Disciplinary Care Plan Details Patient Name: Date of Service: [name], [name] 08/07/2023 8:00 A M Medical Record Number: [id number] Patient Account Number: [id number] Date of Birth/Sex: Treating RN: 02/08/1971 (52 y.o. [name] Primary Care : [name] Other Clinician: Referring : Treating /Extender: [name] in Treatment: 27 Active Inactive Wound/Skin Impairment Nursing Diagnoses: Impaired tissue integrity Knowledge deficit related to ulceration/compromised skin integrity Goals: BREXTEN, CALERO ([id number]) [id number] Page 5 of  8 Patient will have a decrease in wound volume by X% from date: (specify in notes) Date Initiated: 01/24/2023 Target Resolution Date: 08/30/2023 Goal Status: Active Patient/caregiver will verbalize understanding of skin care regimen Date Initiated: 01/24/2023 Target Resolution Date: 08/30/2023 Goal Status: Active Ulcer/skin breakdown will have a volume reduction of 30% by week 4 Date Initiated: 01/24/2023 Date Inactivated: 03/07/2023 Target Resolution Date: 02/27/2023 Goal Status: Unmet Unmet Reason: larger today. Ulcer/skin breakdown will have a volume reduction of 50% by week 8 Date Initiated: 01/24/2023 Target Resolution Date: 08/30/2023 Goal Status: Active Interventions: Assess  patient/caregiver ability to obtain necessary supplies Assess patient/caregiver ability to perform ulcer/skin care regimen upon admission and as needed Assess ulceration(s) every visit Notes: Electronic Signature(s) Signed: 08/07/2023 12:05:26 PM By: Glenn Stall RN, BSN Entered By: [name] on 08/07/2023 08:05:14 -------------------------------------------------------------------------------- Pain Assessment Details Patient Name: Date of Service: [name], [name] 08/07/2023 8:00 A M Medical Record Number: [id number] Patient Account Number: [id number] Date of Birth/Sex: Treating RN: Jan 29, 1971 (52 y.o. [name] Primary Care : [name] Other Clinician: Referring : Treating /Extender: [name] in Treatment: 27 Active Problems Location of Pain Severity and Description of Pain Patient Has Paino No Site Locations Pain Management and Medication Current Pain Management: Electronic Signature(s) Signed: 08/07/2023 12:05:26 PM By: Glenn Stall RN, BSN Entered By: [name] on 08/07/2023 07:52:48 [name] ([id number]) [id number] Page 6 of 8 -------------------------------------------------------------------------------- Patient/Caregiver Education Details Patient Name: Date of Service: [name] [place], [name] RO LD 8/8/2024andnbsp8:00 A M Medical Record Number: [id number] Patient Account Number: [id number] Date of Birth/Gender: Treating RN: 1971-06-08 (52 y.o. [name] Primary Care Physician: [name] Other Clinician: Referring Physician: Treating Physician/Extender: [name] in Treatment: 27 Education Assessment Education Provided To: Patient Education Topics Provided Wound/Skin Impairment: Handouts: Caring for Your Ulcer Methods: Explain/Verbal Responses: Reinforcements needed Electronic Signature(s) Signed: 08/07/2023 12:05:26 PM By:  Glenn Stall RN, BSN Entered By: [name] on 08/07/2023 08:05:29 -------------------------------------------------------------------------------- Wound Assessment Details Patient Name: Date of Service: [name], [name] 08/07/2023 8:00 A M Medical Record Number: [id number] Patient Account Number: [id number] Date of Birth/Sex: Treating RN: 18-Oct-1971 (52 y.o. [name], [name] Primary Care : [name] Other Clinician: Referring : Treating /Extender: [name] in Treatment: 27 Wound Status Wound Number: 3 Primary Diabetic Wound/Ulcer of the Lower Extremity Etiology: Wound Location: Left, Plantar Foot Wound Open Wounding Event: Gradually Appeared Status: Date Acquired: 10/01/2022 Comorbid Chronic sinus problems/congestion, Congestive Heart Failure, Weeks Of Treatment: 27 History: Coronary Artery Disease, Hypertension, Myocardial Infarction, Type Clustered Wound: No II Diabetes, Neuropathy Pending Amputation On Presentation Wound under treatment by  outside of Wound Center Photos Wound Measurements MENDEL, ROMEO ([id number]) Length: (cm) 2.1 Width: (cm) 0.9 Depth: (cm) 0.2 Area: (cm) 1.484 Volume: (cm) 0.297 [id number] Page 7 of 8 % Reduction in Area: 62.2% % Reduction in Volume: 62.2% Epithelialization: Medium (34-66%) Tunneling: No Undermining: No Wound Description Classification: Grade 1 Wound Margin: Thickened Exudate Amount: Medium Exudate Type: Serosanguineous Exudate Color: red, brown Foul Odor After Cleansing: No Slough/Fibrino No Wound Bed Granulation Amount: Large (67-100%) Exposed Structure Granulation Quality: Red Fascia Exposed: No Necrotic Amount: None Present (0%) Fat Layer (Subcutaneous Tissue) Exposed: Yes Tendon Exposed: No Muscle Exposed: No Joint Exposed: No Bone Exposed: No Periwound Skin Texture Texture Color No Abnormalities Noted: No No  Abnormalities Noted: No Callus: Yes Atrophie Blanche: No Crepitus: No Cyanosis: No Excoriation: No Ecchymosis: No Induration: No Erythema:  No Rash: No Hemosiderin Staining: No Scarring: No Mottled: No Pallor: No Moisture Rubor: No No Abnormalities Noted: No Dry / Scaly: No Temperature / Pain Maceration: No Temperature: No Abnormality Treatment Notes Wound #3 (Foot) Wound Laterality: Plantar, Left Cleanser Soap and Water Discharge Instruction: May shower and wash wound with dial antibacterial soap and water prior to dressing change. Vashe 5.8 (oz) Discharge Instruction: Cleanse the wound with Vashe prior to applying a clean dressing using gauze sponges, not tissue or cotton balls. Peri-Wound Care Topical Gentamicin Discharge Instruction: As directed by physician Mupirocin Ointment Discharge Instruction: Apply Mupirocin (Bactroban) as instructed Primary Dressing Hydrofera Blue Ready Transfer Foam, 2.5x2.5 (in/in) Discharge Instruction: Apply directly to wound bed as directed Secondary Dressing ABD Pad, 5x9 Zetuvit Plus 4x8 in Discharge Instruction: Apply over primary dressing as directed. Secured With [employer], 4.5x3.1 (in/yd) Discharge Instruction: Secure with Kerlix as directed. 40M Medipore H Soft Cloth Surgical T ape, 4 x 10 (in/yd) Discharge Instruction: Secure with tape as directed. T contact cast otal Discharge Instruction: size 3 Compression Elbe, [name] ([id number]) [id number] Page 8 of 8 Compression Stockings Add-Ons Electronic Signature(s) Signed: 08/07/2023 12:05:26 PM By: Glenn Stall RN, BSN Entered By: [name] on 08/07/2023 08:03:16 -------------------------------------------------------------------------------- Vitals Details Patient Name: Date of Service: [name] [place], [name] 08/07/2023 8:00 A M Medical Record Number: [id number] Patient Account Number: [id number] Date of Birth/Sex: Treating  RN: [birth year] (52 y.o. [name], Yvonne Kendall Primary Care : [name] Other Clinician: Referring : Treating /Extender: [name] in Treatment: 27 Vital Signs Time Taken: 07:50 Temperature (F): 97.6 Height (in): 78 Pulse (bpm): 81 Weight (lbs): 275 Respiratory Rate (breaths/min): 20 Body Mass Index (BMI): 31.8 Blood Pressure (mmHg): 104/74 Reference Range: 80 - 120 mg / dl Electronic Signature(s) Signed: 08/07/2023 12:05:26 PM By: Glenn Stall RN, BSN Entered By: [name] on 08/07/2023 07:52:44

## 2023-08-07 NOTE — Progress Notes (Signed)
LAIRD, WEINHEIMER ([id number]) [id number] Page 1 of 12 Visit Report for 08/07/2023 Chief Complaint Document Details Patient Name: Date of Service: [name] [place], [name] RO LD 08/07/2023 8:00 A M Medical Record Number: [id number] Patient Account Number: [id number] Date of Birth/Sex: Treating RN: 01/03/1971 (52 y.o. M) Primary Care Provider: [name] Other Clinician: Referring Provider: Treating Provider/Extender: [name] in Treatment: 27 Information Obtained from: Patient Chief Complaint 01/15/2023; left foot wound Electronic Signature(s) Signed: 08/07/2023 10:14:37 AM By: Geralyn Corwin DO Entered By: Geralyn Corwin on 08/07/2023 09:18:26 -------------------------------------------------------------------------------- Debridement Details Patient Name: Date of Service: [name] [place], [name] 08/07/2023 8:00 A M Medical Record Number: [id number] Patient Account Number: [id number] Date of Birth/Sex: Treating RN: 1971-08-09 (52 y.o. [name], [name] Primary Care Provider: [name] Other Clinician: Referring Provider: Treating Provider/Extender: [name] in Treatment: 27 Debridement Performed for Assessment: Wound #3 Left,Plantar Foot Performed By: Physician Geralyn Corwin, DO Debridement Type: Debridement Severity of Tissue Pre Debridement: Fat layer exposed Level of Consciousness (Pre-procedure): Awake and Alert Pre-procedure Verification/Time Out Yes - 08:40 Taken: Start Time: 08:41 Pain Control: Lidocaine 4% T opical Solution Percent of Wound Bed Debrided: 100% T Area Debrided (cm): otal 0.99 Tissue and other material debrided: Viable, Non-Viable, Callus, Slough, Subcutaneous, Skin: Dermis , Skin: Epidermis, Slough Level: Skin/Subcutaneous Tissue Debridement Description: Excisional Instrument: Curette Bleeding: Minimum Hemostasis Achieved: Pressure End Time: 08:46 Procedural Pain:  0 Post Procedural Pain: 0 Response to Treatment: Procedure was tolerated well Level of Consciousness (Post- Awake and Alert procedure): Post Debridement Measurements of Total Wound Length: (cm) 2.1 Width: (cm) 0.6 Depth: (cm) 0.2 Volume: (cm) 0.198 Character of Wound/Ulcer Post Debridement: Improved [name] ([id number]) [id number] Page 2 of 12 Severity of Tissue Post Debridement: Fat layer exposed Post Procedure Diagnosis Same as Pre-procedure Electronic Signature(s) Signed: 08/07/2023 10:14:37 AM By: Geralyn Corwin DO Signed: 08/07/2023 12:05:26 PM By: Shawn Stall RN, BSN Entered By: Shawn Stall on 08/07/2023 08:46:38 -------------------------------------------------------------------------------- HPI Details Patient Name: Date of Service: [name] [place], [name] 08/07/2023 8:00 A M Medical Record Number: [id number] Patient Account Number: [id number] Date of Birth/Sex: Treating RN: [birth year] (52 y.o. M) Primary Care Provider: [name] Other Clinician: Referring Provider: Treating Provider/Extender: [name] in Treatment: 27 History of Present Illness HPI Description: ADMISSION 01/11/2020 This is a 52 year old man with uncontrolled type 2 diabetes with a recent hemoglobin A1c earlier this year of 13.4. He is on insulin and glipizide. He does not take his blood sugars at home. He does have a follow-up with primary care later this month I believe on January 27. He tells [place] that roughly a month ago he was walking with a shoe with a hole in his foot. He took the shoe off and there was an open wound at roughly the left fourth met head. This has significant undermining and raised edges. He has not noticed any purulence he does not feel unwell. More recently he was taking skin off the bottom of his foot and has a superficial area on the left fifth met head. He has not been offloading this. The patient was in the ER  on 12/20. They gave him Bactroban which she has been using on the wound and 10 days worth of doxycycline. No x-rays were done. He has not had vascular studies. He is also been using hydrogen peroxide. Past medical history type 2 diabetes uncontrolled, chronic systolic heart failure, coronary artery disease with a history of congestive heart  failure with stents. Hypertension hyperlipidemia and chronic renal insufficiency ABI in this clinic was 1.14 on the left. 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 He is uncertain whether he would be able to work if we put him on some form of restriction 1/19; he is generally doing quite well. Using silver alginate on the wounds. Things actually look better. He has a forefoot offloading boot which she seems to be compliant about. He has support at work to stay off his foot is much as possible which is gratifying. Culture I did last week showed a few Enterococcus faecalis. I am going to put him on Augmentin. I talked about ordering an x-ray in my note last week but that does not seem to have happened. We will review reorder the x-ray this week. 1/26; x-ray reordered last week was negative for osteomyelitis. We are using silver alginate on the wound on the third and fifth met heads on the left. He is using a Darco forefoot off loader 2/2; the area on the fifth met head is closed. Third met head is still open with tunneling depth and thick callus. 2/9; the area on the fifth met head remains closed however the third met head again has a small open area on presentation with tunneling in depth and surrounded by thick callus. This looks like a pressure issue. We have been using silver alginate 2/16; the area of the fifth metatarsal head remains closed however the area over the third metatarsal head again is a small open area but with some depth. I do not think this is changed much since last week. He is using Hydrofera Blue with forefoot off  loader. He is not able to use a total contact cast on the left leg because he needs his left leg at work [employer]]. Fortunately the wound does not look infected. I changed him to endoform today 2/26; the area of the fifth metatarsal head remains closed. The area of the third metatarsal head has an even smaller opening this time. I used endoform on this this looks improved. He is offloading this is much as he can and a forefoot off loader on the left. He cannot have a total contact cast because of work responsibilities 3/5; the area on the fifth metatarsal head remains closed the area on the third metatarsal head is also closed on the left foot. 01/15/2023 Mr. 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 He has been following for podiatry for several months for this issue. He is currently using wet-to-dry saline dressings. He currently denies signs of infection. Progresse note from 1/17; Patient presents with a 33-month history of nonhealing ulcer to the left foot secondary to diabetes and inability to offload well the area. He has had multiple debridements in the past to his feet bilaterally. He has had resection of the fourth left met head in the fifth toe Secondary to osteomyelitis. We discussed the importance of glycemic control for wound healing. Due to his blood glucose levels being elevated he is at high risk for infection and thus further amputation. He expressed understanding. He states he is supposed to be referred to an endocrinologist at  [facility] however the referral fell through. Unclear what happened. Offered a referral to endocrinology at  [facility]. Patient was agreeable.Furthermore we discussed the importance of aggressive offloading for his wound healing. This will be the most difficult part of the treatment plan for the patient to do. We discussed a total contact cast however he has declined that at this time. 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 He is currently using a surgical shoe with a peg assist. It does not fit well so we will give him a new one today. I do not think this is enough offloading. If he is not able to offload this area he will likely end up with a BKA. He is well aware of this. For now I recommended Medihoney and Hydrofera Blue for dressing changes. He will follow-up in our [place] office Since this is closer for him. [name], [name] ([id number]) [id number] Page 3 of 12 1/26; patient presents for follow-up. Last clinic visit I had seen him in [place] and we transferred him to [place] since this is a closer location for him. Progress note above from that visit. Over the past week He has been using Medihoney and Hydrofera Blue to the wound bed. He has been using his surgical shoe with peg assist. He has no issues or complaints today. We discussed doing the total contact cast and he was agreeable to having this placed at next clinic visit. 2/2; patient presents for follow-up. He has been using Vashe wet-to-dry dressings. Plan is for the total contact cast today. 2/5; patient presents for follow-up. The cast was placed 3 days ago. He tolerated this well however had a lot of drainage. He currently denies signs of infection. 2/9; patient presents for follow-up. At last clinic visit we held off on replacing the cast due to drainage. He has been using Vashe wet-to-dry dressings and he is currently taking the oral antibiotics prescribed without issues. He declines a total contact cast today. We ran insurance  verification for skin substitute and due to cost patient declines having this placed. 2/16; patient presents for follow-up. He has decided not to follow-up with podiatry. He continues to decline the total contact cast. He has been using Vashe wet- to-dry dressings. He has not been wearing his surgical shoe with peg assist. He currently denies signs of infection. 3/1; patient presents for follow-up. He has been doing Vashe wet-to-dry dressings. He has a surgical shoe with peg assist. We discussed potentially doing a skin substitute for which she has been approved for by insurance. He knows the out-of-pocket cost of this and would still like to proceed with having this placed today. He denies the current total contact cast. 3/8; patient presents for follow-up. He states that the skin substitute came off after a few days and he has been using Vashe wet-to-dry dressings. He has mild odor to the wound bed on exam. 3/15; patient presents for follow-up. He has been using Vashe wet-to-dry dressings and started his course of Augmentin. He reports improvement in wound healing. He has no issues or complaints today. 3/21; patient presents for follow-up. He continues to use Vashe wet-to-dry dressings. He has no issues or complaints today. 3/28; patient presents for follow-up. He has been using Vashe wet-to-dry dressings. He has no issues or complaints today. He has been using his peg assist with surgical shoe. 4/11; patient presents for follow-up. He has been  using Vashe wet-to-dry dressings. The periwound is macerated. He has been using his peg assist with surgical shoe. He has no issues or complaints today. 4/25; patient presents for follow-up. Has been using Vashe wet-to-dry dressings. He has been using zinc oxide to the periwound. For offloading he has been using his peg assist in surgical shoe. He has no issues or complaints today. 5/9; patient presents for follow-up. He has been using Vashe wet-to-dry  dressings. He has no issues or complaints today. He has a surgical shoe with peg assist for offloading. 5/23; patient presents for follow up. He has been using Vashe to clean the wound bed and collagen and blast X with dressing changes. [place] the [place] 2 [place]. Wound is bigger. 5/31; patient presents for follow-up. He has been using Vashe wet-to-dry dressings. Wound is stable but with healthier granulation tissue. Patient would like to hold off on the total contact cast and try a knee scooter for offloading. 6/13; patient presents for follow-up. He has been using Vashe wet-to-dry dressings. He has been using a knee scooter for offloading however states he felt unstable on this and decided to buy an electric scooter. This arrived in the mail today. He would like to hold off on the left total contact cast. Wound is slightly smaller today. 6/27; patient presents for follow-up. He has been using Vashe wet-to-dry dressings. He is using an Art gallery manager for mobility to help relieve the pressure off the wound bed. Wound is smaller today. He wants to hold off on doing the total contact cast as he is showing signs of improvement with wound healing. 7/11; patient presents for follow-up. He has been using Vashe wet-to-dry dressings and his electric scooter for offloading. Wound is slightly smaller. 7/25; patient presents for follow up. He has been using Vashe wet-to-dry dressings and he reports using his electric scooter for offloading. Wound is larger. 7/30; patient presents for follow-up. He has been using Vashe wet-to-dry dressings. He has taken Augmentin as prescribed without issues. Overall wound has healthier granulation tissue. 8/1; patient presents for follow-up. He presents for obligatory cast change. He tolerated the cast well and has no issues or complaints today. We have been using antibiotic ointment with Hydrofera Blue under the cast. Wound is slightly smaller. 8/8; patient presents  for follow-up. We have been using antibiotic ointment with Hydrofera Blue under the total contact cast. Wound is smaller. He has no issues or complaints today. Electronic Signature(s) Signed: 08/07/2023 10:14:37 AM By: Geralyn Corwin DO Entered By: Geralyn Corwin on 08/07/2023 09:19:57 -------------------------------------------------------------------------------- Physical Exam Details Patient Name: Date of Service: [name], [name] 08/07/2023 8:00 A M Medical Record Number: [id number] Patient Account Number: [id number] Date of Birth/Sex: Treating RN: [birth year] (52 y.o. M) Primary Care Provider: [name] Other Clinician: Referring Provider: Treating Provider/Extender: [name] in Treatment: [address] [place] ([id number]) [id number] Page 4 of 12 Constitutional respirations regular, non-labored and within target range for patient.. Cardiovascular 2+ dorsalis pedis/posterior tibialis pulses. Psychiatric pleasant and cooperative. Notes Left foot: T the lateral aspect fifth met head there is an open wound with granulation tissue, nonviable tissue and callus. No erythema or increased warmth to o the periwound. Electronic Signature(s) Signed: 08/07/2023 10:14:37 AM By: Geralyn Corwin DO Entered By: Geralyn Corwin on 08/07/2023 09:20:19 -------------------------------------------------------------------------------- Physician Orders Details Patient Name: Date of Service: [name], [name] 08/07/2023 8:00 A M Medical Record Number: [id number] Patient Account Number: [id number] Date of Birth/Sex: Treating  RN: 06/26/1971 (52 y.o. [name] Primary Care Provider: [name] Other Clinician: Referring Provider: Treating Provider/Extender: [name] in Treatment: 46 Verbal / Phone Orders: No Diagnosis Coding ICD-10 Coding Code Description [phone] Non-pressure chronic  ulcer of other part of left foot with fat layer exposed E11.621 Type 2 diabetes mellitus with foot ulcer Z89.422 Acquired absence of other left toe(s) Follow-up Appointments ppointment in 1 week. - Dr Mikey Bussing Room 8 0800 08/14/2023 *****CAST****** Return A ppointment in 2 weeks. - Dr. Mikey Bussing room 8 *****Cast**** Return A Return appointment in 3 weeks. - Dr Mikey Bussing Room 8 *****CAST****** Anesthetic (In clinic) Topical Lidocaine 4% applied to wound bed Cellular or Tissue Based Products Cellular or Tissue Based Product Type: - apligraf and grafix 50% coinsurance 02/28/23-apply 1st Grafix 03/07/2023 HOLD THIS WEEK. Bathing/ Shower/ Hygiene May shower with protection but do not get wound dressing(s) wet. Protect dressing(s) with water repellant cover (for example, large plastic bag) or a cast cover and may then take shower. Off-Loading Total Contact Cast to Left Lower Extremity - size 3 heel cup to toes. foam pad to anterior ankle Wound Treatment Wound #3 - Foot Wound Laterality: Plantar, Left Cleanser: Soap and Water 1 x Per Week/30 Days Discharge Instructions: May shower and wash wound with dial antibacterial soap and water prior to dressing change. Cleanser: Vashe 5.8 (oz) 1 x Per Week/30 Days Discharge Instructions: Cleanse the wound with Vashe prior to applying a clean dressing using gauze sponges, not tissue or cotton balls. Topical: Gentamicin 1 x Per Week/30 Days [name], [name] ([id number]) [phone].pdf Page 5 of 12 Discharge Instructions: As directed by physician Topical: Mupirocin Ointment 1 x Per Week/30 Days Discharge Instructions: Apply Mupirocin (Bactroban) as instructed Prim Dressing: Hydrofera Blue Ready Transfer Foam, 2.5x2.5 (in/in) 1 x Per Week/30 Days ary Discharge Instructions: Apply directly to wound bed as directed Secondary Dressing: ABD Pad, 5x9 1 x Per Week/30 Days Secondary Dressing: Zetuvit Plus 4x8 in 1 x Per Week/30  Days Discharge Instructions: Apply over primary dressing as directed. Secured With: [employer], 4.5x3.1 (in/yd) 1 x Per Week/30 Days Discharge Instructions: Secure with Kerlix as directed. Secured With: 75M Medipore H Soft Cloth Surgical T ape, 4 x 10 (in/yd) 1 x Per Week/30 Days Discharge Instructions: Secure with tape as directed. Secured With: T contact cast 1 x Per Week/30 Days otal Discharge Instructions: size 3 Electronic Signature(s) Signed: 08/07/2023 10:14:37 AM By: Geralyn Corwin DO Entered By: Geralyn Corwin on 08/07/2023 09:20:25 -------------------------------------------------------------------------------- Problem List Details Patient Name: Date of Service: [name], [name] 08/07/2023 8:00 A M Medical Record Number: [id number] Patient Account Number: [id number] Date of Birth/Sex: Treating RN: 04-17-1971 (52 y.o. [name] Primary Care Provider: [name] Other Clinician: Referring Provider: Treating Provider/Extender: [name] in Treatment: 27 Active Problems ICD-10 Encounter Code Description Active Date MDM Diagnosis [phone] Non-pressure chronic ulcer of other part of left foot with fat layer exposed 01/24/2023 No Yes E11.621 Type 2 diabetes mellitus with foot ulcer 01/24/2023 No Yes Z89.422 Acquired absence of other left toe(s) 01/24/2023 No Yes Inactive Problems Resolved Problems Electronic Signature(s) Signed: 08/07/2023 10:14:37 AM By: Geralyn Corwin DO Entered By: Geralyn Corwin on 08/07/2023 09:10:40 [name] ([id number]) [id number] Page 6 of 12 -------------------------------------------------------------------------------- Progress Note Details Patient Name: Date of Service: [name] [place], [name] RO LD 08/07/2023 8:00 A M Medical Record Number: [id number] Patient Account Number: [id number] Date of Birth/Sex: Treating RN: April 22, 1971 (52 y.o. M) Primary Care Provider:  [name] Other Clinician: Referring Provider: Treating Provider/Extender: [name] in Treatment: 27 Subjective Chief Complaint Information obtained from Patient 01/15/2023; left foot wound History of Present Illness (HPI) ADMISSION 01/11/2020 This is a 52 year old man with uncontrolled type 2 diabetes with a recent hemoglobin A1c earlier this year of 13.4. He is on insulin and glipizide. He does not take his blood sugars at home. He does have a follow-up with primary care later this month I believe on January 27. He tells [place] that roughly a month ago he was walking with a shoe with a hole in his foot. He took the shoe off and there was an open wound at roughly the left fourth met head. This has significant undermining and raised edges. He has not noticed any purulence he does not feel unwell. More recently he was taking skin off the bottom of his foot and has a superficial area on the left fifth met head. He has not been offloading this. The patient was in the ER on 12/20. They gave him Bactroban which she has been using on the wound and 10 days worth of doxycycline. No x-rays were done. He has not had vascular studies. He is also been using hydrogen peroxide. Past medical history type 2 diabetes uncontrolled, chronic systolic heart failure, coronary artery disease with a history of congestive heart failure with stents. Hypertension hyperlipidemia and chronic renal insufficiency ABI in this clinic was 1.14 on the left. 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 He is uncertain whether he would be able to work if we put him on some form of restriction 1/19; he is generally doing quite well. Using silver alginate on the wounds. Things actually look better. He has a forefoot offloading boot which she seems to be compliant about. He has support at work to stay off his foot is much as possible which is gratifying. Culture I did last  week showed a few Enterococcus faecalis. I am going to put him on Augmentin. I talked about ordering an x-ray in my note last week but that does not seem to have happened. We will review reorder the x-ray this week. 1/26; x-ray reordered last week was negative for osteomyelitis. We are using silver alginate on the wound on the third and fifth met heads on the left. He is using a Darco forefoot off loader 2/2; the area on the fifth met head is closed. Third met head is still open with tunneling depth and thick callus. 2/9; the area on the fifth met head remains closed however the third met head again has a small open area on presentation with tunneling in depth and surrounded by thick callus. This looks like a pressure issue. We have been using silver alginate 2/16; the area of the fifth metatarsal head remains closed however the area over the third metatarsal head again is a small open area but with some depth. I do not think this is changed much since last week. He is using Hydrofera Blue with forefoot off loader. He is not able to use a total contact cast on the left leg because he needs his left leg at work [employer]]. Fortunately the wound does not look infected. I changed him to endoform today 2/26; the area of the fifth metatarsal head remains closed. The area of the third metatarsal head has an even smaller opening this time. I used endoform on this this looks improved. He is offloading this is  much as he can and a forefoot off loader on the left. He cannot have a total contact cast because of work responsibilities 3/5; the area on the fifth metatarsal head remains closed the area on the third metatarsal head is also closed on the left foot. 01/15/2023 [name] is a 52 year old male with a past medical history of uncontrolled insulin-dependent type 2 diabetes with self-reported last hemoglobin A1c of 12, previous amputations to his feet bilaterally secondary to osteomyelitis,  CAD and chronic combined systolic and diastolic heart failure that presents to the clinic for a 59-month history of nonhealing ulcer to the left lateral foot. He has been following for podiatry for several months for this issue. He is currently using wet-to-dry saline dressings. He currently denies signs of infection. Progresse note from 1/17; Patient presents with a 14-month history of nonhealing ulcer to the left foot secondary to diabetes and inability to offload well the area. He has had multiple debridements in the past to his feet bilaterally. He has had resection of the fourth left met head in the fifth toe Secondary to osteomyelitis. We discussed the importance of glycemic control for wound healing. Due to his blood glucose levels being elevated he is at high risk for infection and thus further amputation. He expressed understanding. He states he is supposed to be referred to an endocrinologist at [facility] however the referral fell through. Unclear what happened. Offered a referral to endocrinology at [facility]. Patient was agreeable.Furthermore we discussed the importance of aggressive offloading for his wound healing. This will be the most difficult part of the treatment plan for the patient to do. We discussed a total contact cast however he has declined that at this time. 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 He is currently using a surgical shoe with a peg assist. It does not fit well so we will give him a new one today. I do not think this is enough offloading. If he is not able to offload this area he will likely end up with a BKA. He is well aware of this. For now I recommended Medihoney and Hydrofera Blue for dressing changes. He will follow-up in our [place] office Since this is closer for him. 1/26; patient presents for follow-up. Last clinic visit I had seen him in [place] and we transferred him to [place]  since this is a closer location for him. Progress note above from that visit. Over the past week He has been using Medihoney and Hydrofera Blue to the wound bed. He has been using his surgical shoe with peg assist. He has no issues or complaints today. We discussed doing the total contact cast and he was agreeable to having this placed at next clinic visit. 2/2; patient presents for follow-up. He has been using Vashe wet-to-dry dressings. Plan is for the total contact cast today. 2/5; patient presents for follow-up. The cast was placed 3 days ago. He tolerated this well however had a lot of drainage. He currently denies signs of infection. 2/9; patient presents for follow-up. At last clinic visit we held off on replacing the cast due to drainage. He has been using Vashe wet-to-dry dressings and he is Glenn, [name] ([id number]) [id number] Page 7 of 12 currently taking the oral antibiotics prescribed without issues. He declines a total contact cast today. We ran insurance verification for skin substitute and due to cost patient  declines having this placed. 2/16; patient presents for follow-up. He has decided not to follow-up with podiatry. He continues to decline the total contact cast. He has been using Vashe wet- to-dry dressings. He has not been wearing his surgical shoe with peg assist. He currently denies signs of infection. 3/1; patient presents for follow-up. He has been doing Vashe wet-to-dry dressings. He has a surgical shoe with peg assist. We discussed potentially doing a skin substitute for which she has been approved for by insurance. He knows the out-of-pocket cost of this and would still like to proceed with having this placed today. He denies the current total contact cast. 3/8; patient presents for follow-up. He states that the skin substitute came off after a few days and he has been using Vashe wet-to-dry dressings. He has mild odor to the wound bed on  exam. 3/15; patient presents for follow-up. He has been using Vashe wet-to-dry dressings and started his course of Augmentin. He reports improvement in wound healing. He has no issues or complaints today. 3/21; patient presents for follow-up. He continues to use Vashe wet-to-dry dressings. He has no issues or complaints today. 3/28; patient presents for follow-up. He has been using Vashe wet-to-dry dressings. He has no issues or complaints today. He has been using his peg assist with surgical shoe. 4/11; patient presents for follow-up. He has been using Vashe wet-to-dry dressings. The periwound is macerated. He has been using his peg assist with surgical shoe. He has no issues or complaints today. 4/25; patient presents for follow-up. Has been using Vashe wet-to-dry dressings. He has been using zinc oxide to the periwound. For offloading he has been using his peg assist in surgical shoe. He has no issues or complaints today. 5/9; patient presents for follow-up. He has been using Vashe wet-to-dry dressings. He has no issues or complaints today. He has a surgical shoe with peg assist for offloading. 5/23; patient presents for follow up. He has been using Vashe to clean the wound bed and collagen and blast X with dressing changes. [place] the [place] 2 [place]. Wound is bigger. 5/31; patient presents for follow-up. He has been using Vashe wet-to-dry dressings. Wound is stable but with healthier granulation tissue. Patient would like to hold off on the total contact cast and try a knee scooter for offloading. 6/13; patient presents for follow-up. He has been using Vashe wet-to-dry dressings. He has been using a knee scooter for offloading however states he felt unstable on this and decided to buy an electric scooter. This arrived in the mail today. He would like to hold off on the left total contact cast. Wound is slightly smaller today. 6/27; patient presents for follow-up. He has been using Vashe  wet-to-dry dressings. He is using an Art gallery manager for mobility to help relieve the pressure off the wound bed. Wound is smaller today. He wants to hold off on doing the total contact cast as he is showing signs of improvement with wound healing. 7/11; patient presents for follow-up. He has been using Vashe wet-to-dry dressings and his electric scooter for offloading. Wound is slightly smaller. 7/25; patient presents for follow up. He has been using Vashe wet-to-dry dressings and he reports using his electric scooter for offloading. Wound is larger. 7/30; patient presents for follow-up. He has been using Vashe wet-to-dry dressings. He has taken Augmentin as prescribed without issues. Overall wound has healthier granulation tissue. 8/1; patient presents for follow-up. He presents for obligatory cast change. He tolerated the  cast well and has no issues or complaints today. We have been using antibiotic ointment with Hydrofera Blue under the cast. Wound is slightly smaller. 8/8; patient presents for follow-up. We have been using antibiotic ointment with Hydrofera Blue under the total contact cast. Wound is smaller. He has no issues or complaints today. Patient History Information obtained from Patient. Family History Heart Disease - Mother,Father, No family history of Cancer, Diabetes, Hereditary Spherocytosis, Hypertension, Kidney Disease, Lung Disease, Seizures, Stroke, Thyroid Problems, Tuberculosis. Social History Never smoker, Marital Status - Single, Alcohol Use - Rarely, Drug Use - No History, Caffeine Use - Rarely. Medical History Eyes Denies history of Cataracts, Glaucoma, Optic Neuritis Ear/Nose/Mouth/Throat Patient has history of Chronic sinus problems/congestion Denies history of Middle ear problems Hematologic/Lymphatic Denies history of Anemia, Hemophilia, Human Immunodeficiency Virus, Lymphedema, Sickle Cell Disease Respiratory Denies history of Aspiration, Asthma, Chronic  Obstructive Pulmonary Disease (COPD), Pneumothorax, Sleep Apnea, Tuberculosis Cardiovascular Patient has history of Congestive Heart Failure, Coronary Artery Disease, Hypertension, Myocardial Infarction - age 21 Denies history of Angina, Arrhythmia, Deep Vein Thrombosis, Hypotension, Peripheral Arterial Disease, Peripheral Venous Disease, Phlebitis, Vasculitis Gastrointestinal Denies history of Cirrhosis , Colitis, Crohns, Hepatitis A, Hepatitis B, Hepatitis C Endocrine Patient has history of Type II Diabetes Genitourinary Denies history of End Stage Renal Disease Immunological Denies history of Lupus Erythematosus, Raynauds, Scleroderma Integumentary (Skin) Denies history of History of Burn Musculoskeletal [name], [name] ([id number]) [id number] Page 8 of 12 Denies history of Gout, Rheumatoid Arthritis, Osteoarthritis, Osteomyelitis Neurologic Patient has history of Neuropathy Denies history of Dementia, Quadriplegia, Paraplegia, Seizure Disorder Oncologic Denies history of Received Chemotherapy, Received Radiation Psychiatric Denies history of Anorexia/bulimia, Confinement Anxiety Hospitalization/Surgery History - Heart Cath in 2018. Medical A Surgical History Notes nd Cardiovascular Ischemic Cardiomyopathy Genitourinary Renal Insufficiency Objective Constitutional respirations regular, non-labored and within target range for patient.. Vitals Time Taken: 7:50 AM, Height: 78 in, Weight: 275 lbs, BMI: 31.8, Temperature: 97.6 F, Pulse: 81 bpm, Respiratory Rate: 20 breaths/min, Blood Pressure: 104/74 mmHg. Cardiovascular 2+ dorsalis pedis/posterior tibialis pulses. Psychiatric pleasant and cooperative. General Notes: Left foot: T the lateral aspect fifth met head there is an open wound with granulation tissue, nonviable tissue and callus. No erythema or o increased warmth to the periwound. Integumentary (Hair, Skin) Wound #3 status is Open.  Original cause of wound was Gradually Appeared. The date acquired was: 10/01/2022. The wound has been in treatment 27 weeks. The wound is located on the Left,Plantar Foot. The wound measures 2.1cm length x 0.9cm width x 0.2cm depth; 1.484cm^2 area and 0.297cm^3 volume. There is Fat Layer (Subcutaneous Tissue) exposed. There is no tunneling or undermining noted. There is a medium amount of serosanguineous drainage noted. The wound margin is thickened. There is large (67-100%) red granulation within the wound bed. There is no necrotic tissue within the wound bed. The periwound skin appearance exhibited: Callus. The periwound skin appearance did not exhibit: Crepitus, Excoriation, Induration, Rash, Scarring, Dry/Scaly, Maceration, Atrophie Blanche, Cyanosis, Ecchymosis, Hemosiderin Staining, Mottled, Pallor, Rubor, Erythema. Periwound temperature was noted as No Abnormality. 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 I debrided nonviable tissue. I recommended continuing with Hydrofera Blue and antibiotic ointment under the contact cast. Procedures Wound #3 Pre-procedure diagnosis of Wound #3 is a Diabetic Wound/Ulcer of the Lower Extremity located on the Left,Plantar  Foot .Severity of Tissue Pre Debridement is: Fat layer exposed. There was a Excisional Skin/Subcutaneous Tissue Debridement with a total area of 0.99 sq cm performed by Geralyn Corwin, DO. With the following instrument(s): Curette to remove Viable and Non-Viable tissue/material. Material removed includes Callus, Subcutaneous Tissue, Slough, Skin: Dermis, and Skin: Epidermis after achieving pain control using Lidocaine 4% Topical Solution. A time out was conducted at 08:40, prior to the start of  the procedure. A Minimum amount of bleeding was controlled with Pressure. The procedure was tolerated well with a pain level of 0 throughout and a pain level of 0 following the procedure. Post Debridement Measurements: 2.1cm length x 0.6cm width x 0.2cm depth; 0.198cm^3 volume. Character of Wound/Ulcer Post Debridement is improved. Severity of Tissue Post Debridement is: Fat layer exposed. Post procedure Diagnosis Wound #3: Same as Pre-Procedure [name], [name] ([id number]) [phone].pdf Page 9 of 12 Pre-procedure diagnosis of Wound #3 is a Diabetic Wound/Ulcer of the Lower Extremity located on the Left,Plantar Foot . There was a T Research scientist (life sciences) otal Procedure by Geralyn Corwin, DO. Post procedure Diagnosis Wound #3: Same as Pre-Procedure Notes: size 3. Plan Follow-up Appointments: Return Appointment in 1 week. - Dr Mikey Bussing Room 8 [id number] 08/14/2023 *****CAST****** Return Appointment in 2 weeks. - Dr. Mikey Bussing room 8 *****Cast**** Return appointment in 3 weeks. - Dr Mikey Bussing Room 8 *****CAST****** Anesthetic: (In clinic) Topical Lidocaine 4% applied to wound bed Cellular or Tissue Based Products: Cellular or Tissue Based Product Type: - apligraf and grafix 50% coinsurance 02/28/23-apply 1st Grafix 03/07/2023 HOLD THIS WEEK. Bathing/ Shower/ Hygiene: May shower with protection but do not get wound dressing(s) wet. Protect dressing(s) with water repellant cover (for example, large plastic bag) or a cast cover and may then take shower. Off-Loading: T Contact Cast to Left Lower Extremity - size 3 heel cup to toes. foam pad to anterior ankle otal WOUND #3: - Foot Wound Laterality: Plantar, Left Cleanser: Soap and Water 1 x Per Week/30 Days Discharge Instructions: May shower and wash wound with dial antibacterial soap and water prior to dressing change. Cleanser: Vashe 5.8 (oz) 1 x Per Week/30 Days Discharge Instructions: Cleanse the wound with Vashe prior to applying a  clean dressing using gauze sponges, not tissue or cotton balls. Topical: Gentamicin 1 x Per Week/30 Days Discharge Instructions: As directed by physician Topical: Mupirocin Ointment 1 x Per Week/30 Days Discharge Instructions: Apply Mupirocin (Bactroban) as instructed Prim Dressing: Hydrofera Blue Ready Transfer Foam, 2.5x2.5 (in/in) 1 x Per Week/30 Days ary Discharge Instructions: Apply directly to wound bed as directed Secondary Dressing: ABD Pad, 5x9 1 x Per Week/30 Days Secondary Dressing: Zetuvit Plus 4x8 in 1 x Per Week/30 Days Discharge Instructions: Apply over primary dressing as directed. Secured With: [employer], 4.5x3.1 (in/yd) 1 x Per Week/30 Days Discharge Instructions: Secure with Kerlix as directed. Secured With: 57M Medipore H Soft Cloth Surgical T ape, 4 x 10 (in/yd) 1 x Per Week/30 Days Discharge Instructions: Secure with tape as directed. Secured With: T contact cast 1 x Per Week/30 Days otal Discharge Instructions: size 3 1. In office sharp debride 2. Hydrofera Blue and antibiotic ointment 3. T contact cast placed in standard fasion otal 4. Follow up one week Electronic Signature(s) Signed: 08/07/2023 10:14:37 AM By: Geralyn Corwin DO Entered By: Geralyn Corwin on 08/07/2023 09:31:46 -------------------------------------------------------------------------------- HxROS Details Patient Name: Date of Service: [name] IN, [name] 08/07/2023 8:00 A M Medical Record Number: [id number] Patient Account Number: [id number] Date of Birth/Sex: Treating RN:  March 15, 1971 (51 y.o. M) Primary Care Provider: [name] Other Clinician: Referring Provider: Treating Provider/Extender: [name] in Treatment: 27 Information Obtained From Patient Eyes Medical History: Negative for: Cataracts; Glaucoma; Optic Neuritis AMAD, FORMOSO ([id number]) [id number] Page 10 of 12 Ear/Nose/Mouth/Throat Medical  History: Positive for: Chronic sinus problems/congestion Negative for: Middle ear problems Hematologic/Lymphatic Medical History: Negative for: Anemia; Hemophilia; Human Immunodeficiency Virus; Lymphedema; Sickle Cell Disease Respiratory Medical History: Negative for: Aspiration; Asthma; Chronic Obstructive Pulmonary Disease (COPD); Pneumothorax; Sleep Apnea; Tuberculosis Cardiovascular Medical History: Positive for: Congestive Heart Failure; Coronary Artery Disease; Hypertension; Myocardial Infarction - age 44 Negative for: Angina; Arrhythmia; Deep Vein Thrombosis; Hypotension; Peripheral Arterial Disease; Peripheral Venous Disease; Phlebitis; Vasculitis Past Medical History Notes: Ischemic Cardiomyopathy Gastrointestinal Medical History: Negative for: Cirrhosis ; Colitis; Crohns; Hepatitis A; Hepatitis B; Hepatitis C Endocrine Medical History: Positive for: Type II Diabetes Time with diabetes: ten years Treated with: Insulin Blood sugar tested every day: No Genitourinary Medical History: Negative for: End Stage Renal Disease Past Medical History Notes: Renal Insufficiency Immunological Medical History: Negative for: Lupus Erythematosus; Raynauds; Scleroderma Integumentary (Skin) Medical History: Negative for: History of Burn Musculoskeletal Medical History: Negative for: Gout; Rheumatoid Arthritis; Osteoarthritis; Osteomyelitis Neurologic Medical History: Positive for: Neuropathy Negative for: Dementia; Quadriplegia; Paraplegia; Seizure Disorder Oncologic Medical History: Negative for: Received Chemotherapy; Received Radiation Psychiatric Medical History: Negative for: Anorexia/bulimia; Confinement Anxiety HBO Extended History Items [name], [name] ([id number]) [id number] Page 11 of 12 Ear/Nose/Mouth/Throat: Chronic sinus problems/congestion Immunizations Pneumococcal Vaccine: Received Pneumococcal Vaccination: No Implantable  Devices None Hospitalization / Surgery History Type of Hospitalization/Surgery Heart Cath in 2018 Family and Social History Cancer: No; Diabetes: No; Heart Disease: Yes - Mother,Father; Hereditary Spherocytosis: No; Hypertension: No; Kidney Disease: No; Lung Disease: No; Seizures: No; Stroke: No; Thyroid Problems: No; Tuberculosis: No; Never smoker; Marital Status - Single; Alcohol Use: Rarely; Drug Use: No History; Caffeine Use: Rarely; Financial Concerns: No; Food, Clothing or Shelter Needs: No; Support System Lacking: No; Transportation Concerns: No Electronic Signature(s) Signed: 08/07/2023 10:14:37 AM By: Geralyn Corwin DO Entered By: Geralyn Corwin on 08/07/2023 09:20:03 -------------------------------------------------------------------------------- Total Contact Cast Details Patient Name: Date of Service: [name] IN, [name] 08/07/2023 8:00 A M Medical Record Number: [id number] Patient Account Number: [id number] Date of Birth/Sex: Treating RN: Nov 20, 1971 (52 y.o. [name] Primary Care Provider: [name] Other Clinician: Referring Provider: Treating Provider/Extender: [name] in Treatment: 27 T Contact Cast Applied for Wound Assessment: otal Wound #3 Left,Plantar Foot Performed By: Physician Geralyn Corwin, DO Post Procedure Diagnosis Same as Pre-procedure Notes size 3 Electronic Signature(s) Signed: 08/07/2023 10:14:37 AM By: Geralyn Corwin DO Signed: 08/07/2023 12:05:26 PM By: Shawn Stall RN, BSN Entered By: Shawn Stall on 08/07/2023 08:05:07 -------------------------------------------------------------------------------- SuperBill Details Patient Name: Date of Service: [name] IN, [name] 08/07/2023 Medical Record Number: [id number] Patient Account Number: [id number] Date of Birth/Sex: Treating RN: 10-08-1971 (52 y.o. [name] Primary Care Provider: [name] Other Clinician: Referring  Provider: Treating Provider/Extender: [name] in Treatment: [address] [name] ([id number]) [id number] Page 12 of 12 ICD-10 Codes Code Description [phone] 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.621 Type 2 diabetes mellitus with foot ulcer Z89.422 Acquired absence of other left toe(s) Facility Procedures : CPT4 Code: [id number] Description: 11042 - DEB SUBQ TISSUE 20 SQ CM/< ICD-10 Diagnosis Description L97.522 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.621 Type 2  diabetes mellitus with foot ulcer Modifier: Quantity: 1 Physician Procedures : CPT4 Code Description Modifier [id number] 11042 - WC PHYS SUBQ TISS 20 SQ CM ICD-10 Diagnosis Description L97.522 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.621 Type 2 diabetes mellitus with foot ulcer Quantity: 1 Electronic Signature(s) Signed: 08/07/2023 10:14:37 AM By: Geralyn Corwin DO Entered By: Geralyn Corwin on 08/07/2023 09:31:54

## 2023-08-12 ENCOUNTER — Encounter (HOSPITAL_BASED_OUTPATIENT_CLINIC_OR_DEPARTMENT_OTHER): Payer: 59 | Admitting: Internal Medicine

## 2023-08-12 DIAGNOSIS — E11621 Type 2 diabetes mellitus with foot ulcer: Secondary | ICD-10-CM

## 2023-08-12 DIAGNOSIS — I5042 Chronic combined systolic (congestive) and diastolic (congestive) heart failure: Secondary | ICD-10-CM | POA: Diagnosis not present

## 2023-08-12 DIAGNOSIS — I2510 Atherosclerotic heart disease of native coronary artery without angina pectoris: Secondary | ICD-10-CM | POA: Diagnosis not present

## 2023-08-12 DIAGNOSIS — N189 Chronic kidney disease, unspecified: Secondary | ICD-10-CM | POA: Diagnosis not present

## 2023-08-12 DIAGNOSIS — I252 Old myocardial infarction: Secondary | ICD-10-CM | POA: Diagnosis not present

## 2023-08-12 DIAGNOSIS — Z89422 Acquired absence of other left toe(s): Secondary | ICD-10-CM | POA: Diagnosis not present

## 2023-08-12 DIAGNOSIS — E1122 Type 2 diabetes mellitus with diabetic chronic kidney disease: Secondary | ICD-10-CM | POA: Diagnosis not present

## 2023-08-12 DIAGNOSIS — Z955 Presence of coronary angioplasty implant and graft: Secondary | ICD-10-CM | POA: Diagnosis not present

## 2023-08-12 DIAGNOSIS — Z794 Long term (current) use of insulin: Secondary | ICD-10-CM | POA: Diagnosis not present

## 2023-08-12 DIAGNOSIS — L97522 Non-pressure chronic ulcer of other part of left foot with fat layer exposed: Secondary | ICD-10-CM

## 2023-08-12 DIAGNOSIS — E785 Hyperlipidemia, unspecified: Secondary | ICD-10-CM | POA: Diagnosis not present

## 2023-08-12 DIAGNOSIS — I130 Hypertensive heart and chronic kidney disease with heart failure and stage 1 through stage 4 chronic kidney disease, or unspecified chronic kidney disease: Secondary | ICD-10-CM | POA: Diagnosis not present

## 2023-08-14 ENCOUNTER — Ambulatory Visit (HOSPITAL_BASED_OUTPATIENT_CLINIC_OR_DEPARTMENT_OTHER): Payer: 59 | Admitting: Internal Medicine

## 2023-08-14 NOTE — Progress Notes (Signed)
RAYCEN, LIBONATI ([id number]) [id number] Page 1 of 8 Visit Report for 08/12/2023 Arrival Information Details Patient Name: Date of Service: [name] [place], [name] RO LD 08/12/2023 2:00 PM Medical Record Number: [id number] Patient Account Number: [id number] Date of Birth/Sex: Treating RN: 26-Jan-1971 (52 y.o. [name], [name] Primary Care : [name] Other Clinician: Referring : Treating /Extender: [name] in Treatment: 28 Visit Information History Since Last Visit Added or deleted any medications: No Patient Arrived: Ambulatory Any new allergies or adverse reactions: No Arrival Time: 14:25 Had a fall or experienced change in No Accompanied By: self activities of daily living that may affect Transfer Assistance: None risk of falls: Patient Requires Transmission-Based Precautions: No Signs or symptoms of abuse/neglect since last visito No Patient Has Alerts: Yes Hospitalized since last visit: No Patient Alerts: 08/2021 ABI:1.2 TBI 0.79 Implantable device outside of the clinic excluding No cellular tissue based products placed in the center since last visit: Has Dressing in Place as Prescribed: Yes Has Footwear/Offloading in Place as Prescribed: Yes Left: T Contact Cast otal Pain Present Now: No Notes Patient worried about cast rubbing. Per patient does not feel it rubbing, but there is an noticeable indent. Per doctor to add patient onto calendar. Electronic Signature(s) Signed: 08/13/2023 6:05:41 PM By: Shawn Stall RN, BSN Entered By: Shawn Stall on 08/12/2023 14:38:05 -------------------------------------------------------------------------------- Encounter Discharge Information Details Patient Name: Date of Service: [name] [place], [name] 08/12/2023 2:00 PM Medical Record Number: [id number] Patient Account Number: [id number] Date of Birth/Sex: Treating RN: 1971/03/17 (52 y.o. [name] Primary Care : [name] Other Clinician: Referring : Treating /Extender: [name] in Treatment: 28 Encounter Discharge Information Items Post Procedure Vitals Discharge Condition: Stable Temperature (F): 97.4 Ambulatory Status: Ambulatory Pulse (bpm): 81 Discharge Destination: Home Respiratory Rate (breaths/min): 18 Transportation: Private Auto Blood Pressure (mmHg): 121/78 Accompanied By: self Schedule Follow-up Appointment: Yes Clinical Summary of Care: Electronic Signature(s) Signed: 08/13/2023 6:05:41 PM By: Shawn Stall RN, BSN Entered By: Shawn Stall on 08/12/2023 15:06:18 [name] ([id number]) [id number] Page 2 of 8 -------------------------------------------------------------------------------- Lower Extremity Assessment Details Patient Name: Date of Service: [name] [place], [name] RO LD 08/12/2023 2:00 PM Medical Record Number: [id number] Patient Account Number: [id number] Date of Birth/Sex: Treating RN: 01/10/1971 (52 y.o. [name] Primary Care : [name] Other Clinician: Referring : Treating /Extender: [name] in Treatment: 28 Edema Assessment Assessed: [name]: Yes] [name]: No] Edema: [Left: N] [Right: o] Calf Left: Right: Point of Measurement: 43 cm From Medial Instep 38 cm Ankle Left: Right: Point of Measurement: 9 cm From Medial Instep 22 cm Vascular Assessment Pulses: Dorsalis Pedis Palpable: [Left:Yes] Extremity colors, hair growth, and conditions: Extremity Color: [Left:Normal] Hair Growth on Extremity: [Left:Yes] Temperature of Extremity: [Left:Warm] Capillary Refill: [Left:< 3 seconds] Dependent Rubor: [Left:No] Blanched when Elevated: [Left:No No] Toe Nail Assessment Left: Right: Thick: No Discolored: No Deformed: No Improper Length and Hygiene: No Electronic  Signature(s) Signed: 08/13/2023 6:05:41 PM By: Shawn Stall RN, BSN Entered By: Shawn Stall on 08/12/2023 14:38:47 -------------------------------------------------------------------------------- Multi Wound Chart Details Patient Name: Date of Service: [name], [name] 08/12/2023 2:00 PM Medical Record Number: [id number] Patient Account Number: [id number] Date of Birth/Sex: Treating RN: 1971/06/29 (52 y.o. M) Primary Care : [name] Other Clinician: Referring : Treating /Extender: [name] in Treatment: 28 Vital Signs Height(in): 78 Pulse(bpm): 81 Weight(lbs): 275 Blood Pressure(mmHg): 121/78 [name], [name] ([id number]) [id number] Page 3 of  8 Body Mass Index(BMI): 31.8 Temperature(F): 97.4 Respiratory Rate(breaths/min): 18 [3:Photos:] [N/A:N/A] Left, Plantar Foot N/A N/A Wound Location: Gradually Appeared N/A N/A Wounding Event: Diabetic Wound/Ulcer of the Lower N/A N/A Primary Etiology: Extremity Chronic sinus problems/congestion, N/A N/A Comorbid History: Congestive Heart Failure, Coronary Artery Disease, Hypertension, Myocardial Infarction, Type II Diabetes, Neuropathy 10/01/2022 N/A N/A Date Acquired: 30 N/A N/A Weeks of Treatment: Open N/A N/A Wound Status: No N/A N/A Wound Recurrence: Yes N/A N/A Pending [name] on Presentation: 1.7x0.7x0.1 N/A N/A Measurements L x W x D (cm) 0.935 N/A N/A A (cm) : rea 0.093 N/A N/A Volume (cm) : 76.20% N/A N/A % Reduction in A rea: 88.20% N/A N/A % Reduction in Volume: Grade 1 N/A N/A Classification: Medium N/A N/A Exudate A mount: Serosanguineous N/A N/A Exudate Type: red, brown N/A N/A Exudate Color: Thickened N/A N/A Wound Margin: Large (67-100%) N/A N/A Granulation A mount: Red, Pink, Hyper-granulation N/A N/A Granulation Quality: None Present (0%) N/A N/A Necrotic A mount: Fat Layer (Subcutaneous  Tissue): Yes N/A N/A Exposed Structures: Fascia: No Tendon: No Muscle: No Joint: No Bone: No Medium (34-66%) N/A N/A Epithelialization: Debridement - Excisional N/A N/A Debridement: Pre-procedure Verification/Time Out 14:55 N/A N/A Taken: Lidocaine 4% Topical Solution N/A N/A Pain Control: Callus, Subcutaneous, Slough N/A N/A Tissue Debrided: Skin/Subcutaneous Tissue N/A N/A Level: 0.93 N/A N/A Debridement A (sq cm): rea Curette N/A N/A Instrument: Minimum N/A N/A Bleeding: Pressure N/A N/A Hemostasis A chieved: 0 N/A N/A Procedural Pain: 0 N/A N/A Post Procedural Pain: Procedure was tolerated well N/A N/A Debridement Treatment Response: 1.7x0.7x0.1 N/A N/A Post Debridement Measurements L x W x D (cm) 0.093 N/A N/A Post Debridement Volume: (cm) Callus: Yes N/A N/A Periwound Skin Texture: Excoriation: No Induration: No Crepitus: No Rash: No Scarring: No Maceration: No N/A N/A Periwound Skin Moisture: Dry/Scaly: No Atrophie Blanche: No N/A N/A Periwound Skin Color: Cyanosis: No Ecchymosis: No Erythema: No Hemosiderin Staining: No Mottled: No Pallor: No Rubor: No No Abnormality N/A N/A Temperature: Debridement N/A N/A Procedures Performed: HAROUT, AROYO ([id number]) [id number] Page 4 of 8 Treatment Notes Wound #3 (Foot) Wound Laterality: Plantar, Left Cleanser Soap and Water Discharge Instruction: May shower and wash wound with dial antibacterial soap and water prior to dressing change. Vashe 5.8 (oz) Discharge Instruction: Cleanse the wound with Vashe prior to applying a clean dressing using gauze sponges, not tissue or cotton balls. Peri-Wound Care Topical Primary Dressing Hydrofera Blue Ready Transfer Foam, 2.5x2.5 (in/in) Discharge Instruction: Apply directly to wound bed as directed Secondary Dressing ABD Pad, 5x9 Zetuvit Plus 4x8 in Discharge Instruction: Apply over primary dressing as directed. Secured  With [employer], 4.5x3.1 (in/yd) Discharge Instruction: Secure with Kerlix as directed. 26M Medipore H Soft Cloth Surgical T ape, 4 x 10 (in/yd) Discharge Instruction: Secure with tape as directed. Compression Wrap Compression Stockings Add-Ons Electronic Signature(s) Signed: 08/12/2023 4:37:08 PM By: Geralyn Corwin DO Entered By: Geralyn Corwin on 08/12/2023 15:33:09 -------------------------------------------------------------------------------- Multi-Disciplinary Care Plan Details Patient Name: Date of Service: [name], [name] 08/12/2023 2:00 PM Medical Record Number: [id number] Patient Account Number: [id number] Date of Birth/Sex: Treating RN: [birth year] (52 y.o. [name] Primary Care : [name] Other Clinician: Referring : Treating /Extender: [name] in Treatment: 28 Active Inactive Wound/Skin Impairment Nursing Diagnoses: Impaired tissue integrity Knowledge deficit related to ulceration/compromised skin integrity Goals: Patient will have a decrease in wound volume by X% from date: (specify in notes) Date Initiated: 01/24/2023 Target Resolution Date:  08/30/2023 Goal Status: Active Patient/caregiver will verbalize understanding of skin care regimen Date Initiated: 01/24/2023 Target Resolution Date: 08/30/2023 Goal Status: Active [name], [name] ([id number]) [id number] Page 5 of 8 Ulcer/skin breakdown will have a volume reduction of 30% by week 4 Date Initiated: 01/24/2023 Date Inactivated: 03/07/2023 Target Resolution Date: 02/27/2023 Goal Status: Unmet Unmet Reason: larger today. Ulcer/skin breakdown will have a volume reduction of 50% by week 8 Date Initiated: 01/24/2023 Target Resolution Date: 08/30/2023 Goal Status: Active Interventions: Assess patient/caregiver ability to obtain necessary supplies Assess patient/caregiver ability to perform ulcer/skin care  regimen upon admission and as needed Assess ulceration(s) every visit Notes: Electronic Signature(s) Signed: 08/13/2023 6:05:41 PM By: Shawn Stall RN, BSN Entered By: Shawn Stall on 08/12/2023 14:57:31 -------------------------------------------------------------------------------- Pain Assessment Details Patient Name: Date of Service: [name], [name] 08/12/2023 2:00 PM Medical Record Number: [id number] Patient Account Number: [id number] Date of Birth/Sex: Treating RN: [birth year] (52 y.o. [name] Primary Care : [name] Other Clinician: Referring : Treating /Extender: [name] in Treatment: 28 Active Problems Location of Pain Severity and Description of Pain Patient Has Paino No Site Locations Pain Management and Medication Current Pain Management: Electronic Signature(s) Signed: 08/13/2023 6:05:41 PM By: Shawn Stall RN, BSN Entered By: Shawn Stall on 08/12/2023 14:38:34 [name] ([id number]) [id number] Page 6 of 8 -------------------------------------------------------------------------------- Patient/Caregiver Education Details Patient Name: Date of Service: [name] [place], [name] RO LD 8/13/2024andnbsp2:00 PM Medical Record Number: [id number] Patient Account Number: [id number] Date of Birth/Gender: Treating RN: Dec 25, 1971 (52 y.o. [name] Primary Care Physician: [name] Other Clinician: Referring Physician: Treating Physician/Extender: [name] in Treatment: 28 Education Assessment Education Provided To: Patient Education Topics Provided Wound/Skin Impairment: Handouts: Caring for Your Ulcer Methods: Explain/Verbal Responses: Reinforcements needed Electronic Signature(s) Signed: 08/13/2023 6:05:41 PM By: Shawn Stall RN, BSN Entered By: Shawn Stall on 08/12/2023  14:57:49 -------------------------------------------------------------------------------- Wound Assessment Details Patient Name: Date of Service: [name], [name] 08/12/2023 2:00 PM Medical Record Number: [id number] Patient Account Number: [id number] Date of Birth/Sex: Treating RN: [birth year] (52 y.o. [name], [name] Primary Care : [name] Other Clinician: Referring : Treating /Extender: [name] in Treatment: 28 Wound Status Wound Number: 3 Primary Diabetic Wound/Ulcer of the Lower Extremity Etiology: Wound Location: Left, Plantar Foot Wound Open Wounding Event: Gradually Appeared Status: Date Acquired: 10/01/2022 Comorbid Chronic sinus problems/congestion, Congestive Heart Failure, Weeks Of Treatment: 28 History: Coronary Artery Disease, Hypertension, Myocardial Infarction, Type Clustered Wound: No II Diabetes, Neuropathy Pending Amputation On Presentation Wound under treatment by  outside of Wound Center Photos Wound Measurements Length: (cm) 1.7 Width: (cm) 0.7 Depth: (cm) 0.1 Area: (cm) 0.93 Volume: (cm) 0.09 Etzkorn, [place] ([id number]) % Reduction in Area: 76.2% % Reduction in Volume: 88.2% Epithelialization: Medium (34-66%) 5 Tunneling: No 3 Undermining: No [id number] Page 7 of 8 Wound Description Classification: Grade 1 Wound Margin: Thickened Exudate Amount: Medium Exudate Type: Serosanguineous Exudate Color: red, brown Foul Odor After Cleansing: No Slough/Fibrino No Wound Bed Granulation Amount: Large (67-100%) Exposed Structure Granulation Quality: Red, Pink, Hyper-granulation Fascia Exposed: No Necrotic Amount: None Present (0%) Fat Layer (Subcutaneous Tissue) Exposed: Yes Tendon Exposed: No Muscle Exposed: No Joint Exposed: No Bone Exposed: No Periwound Skin Texture Texture Color No Abnormalities Noted: No No Abnormalities Noted: No Callus:  Yes Atrophie Blanche: No Crepitus: No Cyanosis: No Excoriation: No Ecchymosis: No Induration: No Erythema: No Rash: No Hemosiderin Staining: No Scarring: No Mottled: No Pallor: No Moisture  Rubor: No No Abnormalities Noted: No Dry / Scaly: No Temperature / Pain Maceration: No Temperature: No Abnormality Treatment Notes Wound #3 (Foot) Wound Laterality: Plantar, Left Cleanser Soap and Water Discharge Instruction: May shower and wash wound with dial antibacterial soap and water prior to dressing change. Vashe 5.8 (oz) Discharge Instruction: Cleanse the wound with Vashe prior to applying a clean dressing using gauze sponges, not tissue or cotton balls. Peri-Wound Care Topical Primary Dressing Hydrofera Blue Ready Transfer Foam, 2.5x2.5 (in/in) Discharge Instruction: Apply directly to wound bed as directed Secondary Dressing ABD Pad, 5x9 Zetuvit Plus 4x8 in Discharge Instruction: Apply over primary dressing as directed. Secured With [employer], 4.5x3.1 (in/yd) Discharge Instruction: Secure with Kerlix as directed. 58M Medipore H Soft Cloth Surgical T ape, 4 x 10 (in/yd) Discharge Instruction: Secure with tape as directed. Compression Wrap Compression Stockings Add-Ons Electronic Signature(s) Signed: 08/13/2023 6:05:41 PM By: Shawn Stall RN, BSN Entered By: Shawn Stall on 08/12/2023 14:39:16 [name] ([id number]) [id number] Page 8 of 8 -------------------------------------------------------------------------------- Vitals Details Patient Name: Date of Service: [name] [place], [name] RO LD 08/12/2023 2:00 PM Medical Record Number: [id number] Patient Account Number: [id number] Date of Birth/Sex: Treating RN: 12/12/1971 (52 y.o. [name], [name] Primary Care : [name] Other Clinician: Referring : Treating /Extender: [name] in Treatment: 28 Vital Signs Time Taken:  10:35 Temperature (F): 97.4 Height (in): 78 Pulse (bpm): 81 Weight (lbs): 275 Respiratory Rate (breaths/min): 18 Body Mass Index (BMI): 31.8 Blood Pressure (mmHg): 121/78 Reference Range: 80 - 120 mg / dl Electronic Signature(s) Signed: 08/13/2023 6:05:41 PM By: Shawn Stall RN, BSN Entered By: Shawn Stall on 08/12/2023 14:38:24

## 2023-08-14 NOTE — Progress Notes (Signed)
[name], ESPERICUETA ([id number]) [id number] Page 1 of 11 Visit Report for 08/12/2023 Chief Complaint Document Details Patient Name: Date of Service: [name] [name], [name] RO LD 08/12/2023 2:00 PM Medical Record Number: [id number] Patient Account Number: [id number] Date of Birth/Sex: Treating RN: Sep 10, 1971 (52 y.o. M) Primary Care Provider: Shan [name] Other Clinician: Referring Provider: Treating Provider/Extender: [name] in Treatment: 28 Information Obtained from: Patient Chief Complaint 01/15/2023; left foot wound Electronic Signature(s) Signed: 08/12/2023 4:37:08 PM By: Glenn Corwin DO Entered By: [name] on 08/12/2023 15:33:37 -------------------------------------------------------------------------------- Debridement Details Patient Name: Date of Service: [name] IN, [name] 08/12/2023 2:00 PM Medical Record Number: [id number] Patient Account Number: [id number] Date of Birth/Sex: Treating RN: 06/07/1971 (52 y.o. [name], [name] Primary Care Provider: Shan [name] Other Clinician: Referring Provider: Treating Provider/Extender: [name] in Treatment: 28 Debridement Performed for Assessment: Wound #3 Left,Plantar Foot Performed By: Physician Glenn Corwin, DO Debridement Type: Debridement Severity of Tissue Pre Debridement: Fat layer exposed Level of Consciousness (Pre-procedure): Awake and Alert Pre-procedure Verification/Time Out Yes - 14:55 Taken: Start Time: 14:56 Pain Control: Lidocaine 4% T opical Solution Percent of Wound Bed Debrided: 100% T Area Debrided (cm): otal 0.93 Tissue and other material debrided: Viable, Non-Viable, Callus, Slough, Subcutaneous, Skin: Dermis , Skin: Epidermis, Slough Level: Skin/Subcutaneous Tissue Debridement Description: Excisional Instrument: Curette Bleeding: Minimum Hemostasis Achieved: Pressure End Time: 15:03 Procedural  Pain: 0 Post Procedural Pain: 0 Response to Treatment: Procedure was tolerated well Level of Consciousness (Post- Awake and Alert procedure): Post Debridement Measurements of Total Wound Length: (cm) 1.7 Width: (cm) 0.7 Depth: (cm) 0.1 Volume: (cm) 0.093 Character of Wound/Ulcer Post Debridement: Improved [name], [name] ([id number]) [id number] Page 2 of 11 Severity of Tissue Post Debridement: Fat layer exposed Post Procedure Diagnosis Same as Pre-procedure Electronic Signature(s) Signed: 08/12/2023 4:37:08 PM By: Glenn Corwin DO Signed: 08/13/2023 6:05:41 PM By: Glenn Stall RN, BSN Entered By: [name] on 08/12/2023 15:03:54 -------------------------------------------------------------------------------- HPI Details Patient Name: Date of Service: [name] IN, [name] 08/12/2023 2:00 PM Medical Record Number: [id number] Patient Account Number: [id number] Date of Birth/Sex: Treating RN: 1971/08/02 (52 y.o. M) Primary Care Provider: Shan [name] Other Clinician: Referring Provider: Treating Provider/Extender: [name] in Treatment: 28 History of Present Illness HPI Description: ADMISSION 01/11/2020 This is a 52 year old man with uncontrolled type 2 diabetes with a recent hemoglobin A1c earlier this year of 13.4. He is on insulin and glipizide. He does not take his blood sugars at home. He does have a follow-up with primary care later this month I believe on January 27. He tells [place] that roughly a month ago he was walking with a shoe with a hole in his foot. He took the shoe off and there was an open wound at roughly the left fourth met head. This has significant undermining and raised edges. He has not noticed any purulence he does not feel unwell. More recently he was taking skin off the bottom of his foot and has a superficial area on the left fifth met head. He has not been offloading this. The patient was in  the ER on 12/20. They gave him Bactroban which she has been using on the wound and 10 days worth of doxycycline. No x-rays were done. He has not had vascular studies. He is also been using hydrogen peroxide. Past medical history type 2 diabetes uncontrolled, chronic systolic heart failure, coronary artery disease with a history of congestive heart failure with stents.  Hypertension hyperlipidemia and chronic renal insufficiency ABI in this clinic was 1.14 on the left. 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 He is uncertain whether he would be able to work if we put him on some form of restriction 1/19; he is generally doing quite well. Using silver alginate on the wounds. Things actually look better. He has a forefoot offloading boot which she seems to be compliant about. He has support at work to stay off his foot is much as possible which is gratifying. Culture I did last week showed a few Enterococcus faecalis. I am going to put him on Augmentin. I talked about ordering an x-ray in my note last week but that does not seem to have happened. We will review reorder the x-ray this week. 1/26; x-ray reordered last week was negative for osteomyelitis. We are using silver alginate on the wound on the third and fifth met heads on the left. He is using a Darco forefoot off loader 2/2; the area on the fifth met head is closed. Third met head is still open with tunneling depth and thick callus. 2/9; the area on the fifth met head remains closed however the third met head again has a small open area on presentation with tunneling in depth and surrounded by thick callus. This looks like a pressure issue. We have been using silver alginate 2/16; the area of the fifth metatarsal head remains closed however the area over the third metatarsal head again is a small open area but with some depth. I do not think this is changed much since last week. He is using Hydrofera Blue with  forefoot off loader. He is not able to use a total contact cast on the left leg because he needs his left leg at work [employer]]. Fortunately the wound does not look infected. I changed him to endoform today 2/26; the area of the fifth metatarsal head remains closed. The area of the third metatarsal head has an even smaller opening this time. I used endoform on this this looks improved. He is offloading this is much as he can and a forefoot off loader on the left. He cannot have a total contact cast because of work responsibilities 3/5; the area on the fifth metatarsal head remains closed the area on the third metatarsal head is also closed on the left foot. 01/15/2023 Mr. 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 He has been following for podiatry for several months for this issue. He is currently using wet-to-dry saline dressings. He currently denies signs of infection. Progresse note from 1/17; Patient presents with a 39-month history of nonhealing ulcer to the left foot secondary to diabetes and inability to offload well the area. He has had multiple debridements in the past to his feet bilaterally. He has had resection of the fourth left met head in the fifth toe Secondary to osteomyelitis. We discussed the importance of glycemic control for wound healing. Due to his blood glucose levels being elevated he is at high risk for infection and thus further amputation. He expressed understanding. He states he is supposed to be referred to an endocrinologist at [facility] however the  referral fell through. Unclear what happened. Offered a referral to  endocrinology at [facility]. Patient was agreeable.Furthermore we discussed the importance of aggressive offloading for his wound healing. This will be the most difficult part of the treatment plan for the patient to do. We discussed a total contact cast however he has declined that at this time. 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 He is currently using a surgical shoe with a peg assist. It does not fit well so we will give him a new one today. I do not think this is enough offloading. If he is not able to offload this area he will likely end up with a BKA. He is well aware of this. For now I recommended Medihoney and Hydrofera Blue for dressing changes. He will follow-up in our [place] office Since this is closer for him. [name], [name] ([id number]) [id number] Page 3 of 11 1/26; patient presents for follow-up. Last clinic visit I had seen him in [place] and we transferred him to [place] since this is a closer location for him. Progress note above from that visit. Over the past week He has been using Medihoney and Hydrofera Blue to the wound bed. He has been using his surgical shoe with peg assist. He has no issues or complaints today. We discussed doing the total contact cast and he was agreeable to having this placed at next clinic visit. 2/2; patient presents for follow-up. He has been using Vashe wet-to-dry dressings. Plan is for the total contact cast today. 2/5; patient presents for follow-up. The cast was placed 3 days ago. He tolerated this well however had a lot of drainage. He currently denies signs of infection. 2/9; patient presents for follow-up. At last clinic visit we held off on replacing the cast due to drainage. He has been using Vashe wet-to-dry dressings and he is currently taking the oral antibiotics prescribed without issues. He declines a total contact cast today. We  ran insurance verification for skin substitute and due to cost patient declines having this placed. 2/16; patient presents for follow-up. He has decided not to follow-up with podiatry. He continues to decline the total contact cast. He has been using Vashe wet- to-dry dressings. He has not been wearing his surgical shoe with peg assist. He currently denies signs of infection. 3/1; patient presents for follow-up. He has been doing Vashe wet-to-dry dressings. He has a surgical shoe with peg assist. We discussed potentially doing a skin substitute for which she has been approved for by insurance. He knows the out-of-pocket cost of this and would still like to proceed with having this placed today. He denies the current total contact cast. 3/8; patient presents for follow-up. He states that the skin substitute came off after a few days and he has been using Vashe wet-to-dry dressings. He has mild odor to the wound bed on exam. 3/15; patient presents for follow-up. He has been using Vashe wet-to-dry dressings and started his course of Augmentin. He reports improvement in wound healing. He has no issues or complaints today. 3/21; patient presents for follow-up. He continues to use Vashe wet-to-dry dressings. He has no issues or complaints today. 3/28; patient presents for follow-up. He has been using Vashe wet-to-dry dressings. He has no issues or complaints today. He has been using his peg assist with surgical shoe. 4/11; patient presents for follow-up. He has been using Vashe wet-to-dry  dressings. The periwound is macerated. He has been using his peg assist with surgical shoe. He has no issues or complaints today. 4/25; patient presents for follow-up. Has been using Vashe wet-to-dry dressings. He has been using zinc oxide to the periwound. For offloading he has been using his peg assist in surgical shoe. He has no issues or complaints today. 5/9; patient presents for follow-up. He has been using Vashe  wet-to-dry dressings. He has no issues or complaints today. He has a surgical shoe with peg assist for offloading. 5/23; patient presents for follow up. He has been using Vashe to clean the wound bed and collagen and blast X with dressing changes. [place] the [place] 2 [place]. Wound is bigger. 5/31; patient presents for follow-up. He has been using Vashe wet-to-dry dressings. Wound is stable but with healthier granulation tissue. Patient would like to hold off on the total contact cast and try a knee scooter for offloading. 6/13; patient presents for follow-up. He has been using Vashe wet-to-dry dressings. He has been using a knee scooter for offloading however states he felt unstable on this and decided to buy an electric scooter. This arrived in the mail today. He would like to hold off on the left total contact cast. Wound is slightly smaller today. 6/27; patient presents for follow-up. He has been using Vashe wet-to-dry dressings. He is using an Art gallery manager for mobility to help relieve the pressure off the wound bed. Wound is smaller today. He wants to hold off on doing the total contact cast as he is showing signs of improvement with wound healing. 7/11; patient presents for follow-up. He has been using Vashe wet-to-dry dressings and his electric scooter for offloading. Wound is slightly smaller. 7/25; patient presents for follow up. He has been using Vashe wet-to-dry dressings and he reports using his electric scooter for offloading. Wound is larger. 7/30; patient presents for follow-up. He has been using Vashe wet-to-dry dressings. He has taken Augmentin as prescribed without issues. Overall wound has healthier granulation tissue. 8/1; patient presents for follow-up. He presents for obligatory cast change. He tolerated the cast well and has no issues or complaints today. We have been using antibiotic ointment with Hydrofera Blue under the cast. Wound is slightly smaller. 8/8; patient  presents for follow-up. We have been using antibiotic ointment with Hydrofera Blue under the total contact cast. Wound is smaller. He has no issues or complaints today. 8/13; patient presents for follow-up. We have been using antibiotic ointment with Hydrofera Blue under the total contact cast. He comes in today because the top part of the cast over the foot has sunken as well as on the side. He wanted to assure that there were no issues. Electronic Signature(s) Signed: 08/12/2023 4:37:08 PM By: Glenn Corwin DO Entered By: [name] on 08/12/2023 15:34:29 -------------------------------------------------------------------------------- Physical Exam Details Patient Name: Date of Service: [name] IN, [name] 08/12/2023 2:00 PM Medical Record Number: [id number] Patient Account Number: [id number] Date of Birth/Sex: Treating RN: 1971/02/18 (52 y.o. [name], [name] ([id number]) [id number] Page 4 of 11 Primary Care Provider: Shan [name] Other Clinician: Referring Provider: Treating Provider/Extender: [name] in Treatment: 28 Constitutional respirations regular, non-labored and within target range for patient.. Cardiovascular 2+ dorsalis pedis/posterior tibialis pulses. Psychiatric pleasant and cooperative. Notes Left foot: T the lateral aspect fifth met head there is an open wound with granulation tissue, hypergranulated areas, slough and callus. No erythema or o increased warmth to the periwound. No  other open wounds. Electronic Signature(s) Signed: 08/12/2023 4:37:08 PM By: Glenn Corwin DO Entered By: [name] on 08/12/2023 15:35:50 -------------------------------------------------------------------------------- Physician Orders Details Patient Name: Date of Service: [name] IN, [name] 08/12/2023 2:00 PM Medical Record Number: [id number] Patient Account Number: [id number] Date of Birth/Sex:  Treating RN: Oct 08, 1971 (52 y.o. [name] Primary Care Provider: Shan [name] Other Clinician: Referring Provider: Treating Provider/Extender: [name] in Treatment: 28 Verbal / Phone Orders: No Diagnosis Coding ICD-10 Coding Code Description [phone] 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.621 Type 2 diabetes mellitus with foot ulcer Z89.422 Acquired absence of other left toe(s) Follow-up Appointments ppointment in 1 week. - Dr Mikey Bussing Room 8 [id number] 08/14/2023 *****CAST****** Return A ppointment in 2 weeks. - Dr. Mikey Bussing room 8 *****Cast**** Return A Return appointment in 3 weeks. - Dr Mikey Bussing Room 8 *****CAST****** Anesthetic (In clinic) Topical Lidocaine 4% applied to wound bed Cellular or Tissue Based Products Cellular or Tissue Based Product Type: - apligraf and grafix 50% coinsurance 02/28/23-apply 1st Grafix 03/07/2023 HOLD THIS WEEK. Bathing/ Shower/ Hygiene May shower with protection but do not get wound dressing(s) wet. Protect dressing(s) with water repellant cover (for example, large plastic bag) or a cast cover and may then take shower. Off-Loading Total Contact Cast to Left Lower Extremity - size 3 heel cup to toes. foam pad to anterior ankle-HOLD TODAY 08/12/2023 DUE TO NON AVALIABLE. Open toe surgical shoe to: - with Peg assist. Wound Treatment Wound #3 - Foot Wound Laterality: Plantar, Left Cleanser: Soap and Water 1 x Per Week/30 Days Discharge Instructions: May shower and wash wound with dial antibacterial soap and water prior to dressing change. DAVYD, GARRIDO ([id number]) [id number] Page 5 of 11 Cleanser: Vashe 5.8 (oz) 1 x Per Week/30 Days Discharge Instructions: Cleanse the wound with Vashe prior to applying a clean dressing using gauze sponges, not tissue or cotton balls. Prim Dressing: Hydrofera Blue Ready Transfer Foam, 2.5x2.5 (in/in) 1 x Per  Week/30 Days ary Discharge Instructions: Apply directly to wound bed as directed Secondary Dressing: ABD Pad, 5x9 1 x Per Week/30 Days Secondary Dressing: Zetuvit Plus 4x8 in 1 x Per Week/30 Days Discharge Instructions: Apply over primary dressing as directed. Secured With: [employer], 4.5x3.1 (in/yd) 1 x Per Week/30 Days Discharge Instructions: Secure with Kerlix as directed. Secured With: 35M Medipore H Soft Cloth Surgical T ape, 4 x 10 (in/yd) 1 x Per Week/30 Days Discharge Instructions: Secure with tape as directed. Electronic Signature(s) Signed: 08/12/2023 4:37:08 PM By: Glenn Corwin DO Entered By: [name] on 08/12/2023 15:36:10 -------------------------------------------------------------------------------- Problem List Details Patient Name: Date of Service: [name] IN, [name] 08/12/2023 2:00 PM Medical Record Number: [id number] Patient Account Number: [id number] Date of Birth/Sex: Treating RN: [birth year] (52 y.o. [name] Primary Care Provider: Shan [name] Other Clinician: Referring Provider: Treating Provider/Extender: [name] in Treatment: 28 Active Problems ICD-10 Encounter Code Description Active Date MDM Diagnosis [phone] Non-pressure chronic ulcer of other part of left foot with fat layer exposed 01/24/2023 No Yes E11.621 Type 2 diabetes mellitus with foot ulcer 01/24/2023 No Yes Z89.422 Acquired absence of other left toe(s) 01/24/2023 No Yes Inactive Problems Resolved Problems Electronic Signature(s) Signed: 08/12/2023 4:37:08 PM By: Glenn Corwin DO Entered By: [name] on 08/12/2023 15:33:01 [name] ([id number]) [phone].pdf Page 6 of 11 -------------------------------------------------------------------------------- Progress Note Details Patient Name: Date of Service: [name] IN, [name] 08/12/2023 2:00 PM  Medical Record Number: [id number] Patient  Account Number: [id number] Date of Birth/Sex: Treating RN: 1971-02-10 (52 y.o. M) Primary Care Provider: Shan [name] Other Clinician: Referring Provider: Treating Provider/Extender: [name] in Treatment: 28 Subjective Chief Complaint Information obtained from Patient 01/15/2023; left foot wound History of Present Illness (HPI) ADMISSION 01/11/2020 This is a 52 year old man with uncontrolled type 2 diabetes with a recent hemoglobin A1c earlier this year of 13.4. He is on insulin and glipizide. He does not take his blood sugars at home. He does have a follow-up with primary care later this month I believe on January 27. He tells [place] that roughly a month ago he was walking with a shoe with a hole in his foot. He took the shoe off and there was an open wound at roughly the left fourth met head. This has significant undermining and raised edges. He has not noticed any purulence he does not feel unwell. More recently he was taking skin off the bottom of his foot and has a superficial area on the left fifth met head. He has not been offloading this. The patient was in the ER on 12/20. They gave him Bactroban which she has been using on the wound and 10 days worth of doxycycline. No x-rays were done. He has not had vascular studies. He is also been using hydrogen peroxide. Past medical history type 2 diabetes uncontrolled, chronic systolic heart failure, coronary artery disease with a history of congestive heart failure with stents. Hypertension hyperlipidemia and chronic renal insufficiency ABI in this clinic was 1.14 on the left. 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 He is uncertain whether he would be able to work if we put him on some form of restriction 1/19; he is generally doing quite well. Using silver alginate on the wounds. Things actually look better. He has a forefoot offloading boot which she seems to be compliant about.  He has support at work to stay off his foot is much as possible which is gratifying. Culture I did last week showed a few Enterococcus faecalis. I am going to put him on Augmentin. I talked about ordering an x-ray in my note last week but that does not seem to have happened. We will review reorder the x-ray this week. 1/26; x-ray reordered last week was negative for osteomyelitis. We are using silver alginate on the wound on the third and fifth met heads on the left. He is using a Darco forefoot off loader 2/2; the area on the fifth met head is closed. Third met head is still open with tunneling depth and thick callus. 2/9; the area on the fifth met head remains closed however the third met head again has a small open area on presentation with tunneling in depth and surrounded by thick callus. This looks like a pressure issue. We have been using silver alginate 2/16; the area of the fifth metatarsal head remains closed however the area over the third metatarsal head again is a small open area but with some depth. I do not think this is changed much since last week. He is using Hydrofera Blue with forefoot off loader. He is not able to use a total contact cast on the left leg because he needs his left leg at work [employer]]. Fortunately the wound does not look infected. I changed him to endoform today 2/26; the area of the fifth metatarsal head remains closed. The area of the third metatarsal  head has an even smaller opening this time. I used endoform on this this looks improved. He is offloading this is much as he can and a forefoot off loader on the left. He cannot have a total contact cast because of work responsibilities 3/5; the area on the fifth metatarsal head remains closed the area on the third metatarsal head is also closed on the left foot. 01/15/2023 Mr. 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 He has been following for podiatry for several months for this issue. He is currently using wet-to-dry saline dressings. He currently denies signs of infection. Progresse note from 1/17; Patient presents with a 4-month history of nonhealing ulcer to the left foot secondary to diabetes and inability to offload well the area. He has had multiple debridements in the past to his feet bilaterally. He has had resection of the fourth left met head in the fifth toe Secondary to osteomyelitis. We discussed the importance of glycemic control for wound healing. Due to his blood glucose levels being elevated he is at high risk for infection and thus further amputation. He expressed understanding. He states he is supposed to be referred to an endocrinologist at [facility] however the referral fell through. Unclear what happened. Offered a referral to endocrinology at [facility]. Patient was agreeable.Furthermore we discussed the importance of aggressive offloading for his wound healing. This will be the most difficult part of the treatment plan for the patient to do. We discussed a total contact cast however he has declined that at this time. 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 He is currently using a surgical shoe with a peg assist. It does not fit well so we will give him a new one today. I do not think this is enough offloading. If he is not able to offload this area he will likely end up with a BKA. He is well aware of this. For now I recommended Medihoney and Hydrofera Blue for dressing changes. He will follow-up in our [place] office Since this is closer for him. 1/26;  patient presents for follow-up. Last clinic visit I had seen him in [place] and we transferred him to [place] since this is a closer location for him. Progress note above from that visit. Over the past week He has been using Medihoney and Hydrofera Blue to the wound bed. He has been using his surgical shoe with peg assist. He has no issues or complaints today. We discussed doing the total contact cast and he was agreeable to having this placed at next clinic visit. 2/2; patient presents for follow-up. He has been using Vashe wet-to-dry dressings. Plan is for the total contact cast today. 2/5; patient presents for follow-up. The cast was placed 3 days ago. He tolerated this well however had a lot of drainage. He currently denies signs of infection. 2/9; patient presents for follow-up. At last clinic visit we held off on replacing the cast due to drainage. He has been using Vashe wet-to-dry dressings and he is currently taking the oral antibiotics prescribed without issues. He declines a total contact cast  today. We ran insurance verification for skin substitute and due to cost patient declines having this placed. 2/16; patient presents for follow-up. He has decided not to follow-up with podiatry. He continues to decline the total contact cast. He has been using Vashe wet- to-dry dressings. He has not been wearing his surgical shoe with peg assist. He currently denies signs of infection. 3/1; patient presents for follow-up. He has been doing Vashe wet-to-dry dressings. He has a surgical shoe with peg assist. We discussed potentially doing a TYJON, NELLI ([id number]) [id number] Page 7 of 11 skin substitute for which she has been approved for by insurance. He knows the out-of-pocket cost of this and would still like to proceed with having this placed today. He denies the current total contact cast. 3/8; patient presents for follow-up. He states that the skin  substitute came off after a few days and he has been using Vashe wet-to-dry dressings. He has mild odor to the wound bed on exam. 3/15; patient presents for follow-up. He has been using Vashe wet-to-dry dressings and started his course of Augmentin. He reports improvement in wound healing. He has no issues or complaints today. 3/21; patient presents for follow-up. He continues to use Vashe wet-to-dry dressings. He has no issues or complaints today. 3/28; patient presents for follow-up. He has been using Vashe wet-to-dry dressings. He has no issues or complaints today. He has been using his peg assist with surgical shoe. 4/11; patient presents for follow-up. He has been using Vashe wet-to-dry dressings. The periwound is macerated. He has been using his peg assist with surgical shoe. He has no issues or complaints today. 4/25; patient presents for follow-up. Has been using Vashe wet-to-dry dressings. He has been using zinc oxide to the periwound. For offloading he has been using his peg assist in surgical shoe. He has no issues or complaints today. 5/9; patient presents for follow-up. He has been using Vashe wet-to-dry dressings. He has no issues or complaints today. He has a surgical shoe with peg assist for offloading. 5/23; patient presents for follow up. He has been using Vashe to clean the wound bed and collagen and blast X with dressing changes. [place] the [place] 2 [place]. Wound is bigger. 5/31; patient presents for follow-up. He has been using Vashe wet-to-dry dressings. Wound is stable but with healthier granulation tissue. Patient would like to hold off on the total contact cast and try a knee scooter for offloading. 6/13; patient presents for follow-up. He has been using Vashe wet-to-dry dressings. He has been using a knee scooter for offloading however states he felt unstable on this and decided to buy an electric scooter. This arrived in the mail today. He would like to hold off on  the left total contact cast. Wound is slightly smaller today. 6/27; patient presents for follow-up. He has been using Vashe wet-to-dry dressings. He is using an Art gallery manager for mobility to help relieve the pressure off the wound bed. Wound is smaller today. He wants to hold off on doing the total contact cast as he is showing signs of improvement with wound healing. 7/11; patient presents for follow-up. He has been using Vashe wet-to-dry dressings and his electric scooter for offloading. Wound is slightly smaller. 7/25; patient presents for follow up. He has been using Vashe wet-to-dry dressings and he reports using his electric scooter for offloading. Wound is larger. 7/30; patient presents for follow-up. He has been using Vashe wet-to-dry dressings. He has taken Augmentin as prescribed without  issues. Overall wound has healthier granulation tissue. 8/1; patient presents for follow-up. He presents for obligatory cast change. He tolerated the cast well and has no issues or complaints today. We have been using antibiotic ointment with Hydrofera Blue under the cast. Wound is slightly smaller. 8/8; patient presents for follow-up. We have been using antibiotic ointment with Hydrofera Blue under the total contact cast. Wound is smaller. He has no issues or complaints today. 8/13; patient presents for follow-up. We have been using antibiotic ointment with Hydrofera Blue under the total contact cast. He comes in today because the top part of the cast over the foot has sunken as well as on the side. He wanted to assure that there were no issues. Patient History Information obtained from Patient. Family History Heart Disease - Mother,Father, No family history of Cancer, Diabetes, Hereditary Spherocytosis, Hypertension, Kidney Disease, Lung Disease, Seizures, Stroke, Thyroid Problems, Tuberculosis. Social History Never smoker, Marital Status - Single, Alcohol Use - Rarely, Drug Use - No History,  Caffeine Use - Rarely. 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 30 Denies history of Angina, Arrhythmia, Deep Vein Thrombosis, Hypotension, Peripheral Arterial Disease, Peripheral Venous Disease, Phlebitis, Vasculitis Gastrointestinal Denies history of Cirrhosis , Colitis, Crohns, Hepatitis A, Hepatitis B, Hepatitis C Endocrine Patient has history of Type II Diabetes Genitourinary Denies history of End Stage Renal Disease Immunological Denies history of Lupus Erythematosus, Raynauds, Scleroderma Integumentary (Skin) Denies history of History of Burn Musculoskeletal Denies history of Gout, Rheumatoid Arthritis, Osteoarthritis, Osteomyelitis Neurologic Patient has history of Neuropathy Denies history of Dementia, Quadriplegia, Paraplegia, Seizure Disorder [name], [name] ([id number]) [id number] Page 8 of 11 Oncologic Denies history of Received Chemotherapy, Received Radiation Psychiatric Denies history of Anorexia/bulimia, Confinement Anxiety Hospitalization/Surgery History - Heart Cath in 2018. Medical A Surgical History Notes nd Cardiovascular Ischemic Cardiomyopathy Genitourinary Renal Insufficiency Objective Constitutional respirations regular, non-labored and within target range for patient.. Vitals Time Taken: 10:35 AM, Height: 78 in, Weight: 275 lbs, BMI: 31.8, Temperature: 97.4 F, Pulse: 81 bpm, Respiratory  Rate: 18 breaths/min, Blood Pressure: 121/78 mmHg. Cardiovascular 2+ dorsalis pedis/posterior tibialis pulses. Psychiatric pleasant and cooperative. General Notes: Left foot: T the lateral aspect fifth met head there is an open wound with granulation tissue, hypergranulated areas, slough and callus. No o erythema or increased warmth to the periwound. No other open wounds. Integumentary (Hair, Skin) Wound #3 status is Open. Original cause of wound was Gradually Appeared. The date acquired was: 10/01/2022. The wound has been in treatment 28 weeks. The wound is located on the Left,Plantar Foot. The wound measures 1.7cm length x 0.7cm width x 0.1cm depth; 0.935cm^2 area and 0.093cm^3 volume. There is Fat Layer (Subcutaneous Tissue) exposed. There is no tunneling or undermining noted. There is a medium amount of serosanguineous drainage noted. The wound margin is thickened. There is large (67-100%) red, pink, hyper - granulation within the wound bed. There is no necrotic tissue within the wound bed. The periwound skin appearance exhibited: Callus. The periwound skin appearance did not exhibit: Crepitus, Excoriation, Induration, Rash, Scarring, Dry/Scaly, Maceration, Atrophie Blanche, Cyanosis, Ecchymosis, Hemosiderin Staining, Mottled, Pallor, Rubor, Erythema. Periwound temperature was noted as No Abnormality. 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 Luckily it did not create any issues. The wound we are treating is actually smaller. I debrided nonviable tissue and used silver nitrate to the hyper granulated areas. Unfortunately there is no cast available to put on the patient today. I recommended for now Hydrofera Blue. I am hoping that the casts will be delivered soon. He is  scheduled for Thursday. We will keep this appointment to hopefully have the cast placed again. Procedures Wound #3 Pre-procedure diagnosis of Wound #3 is a Diabetic Wound/Ulcer of the Lower Extremity located on the Left,Plantar Foot .Severity of Tissue Pre Debridement is: Fat layer exposed. There was a Excisional Skin/Subcutaneous Tissue Debridement with a total area of 0.93 sq cm performed by Glenn Corwin, DO. With the following instrument(s): Curette to remove Viable and Non-Viable tissue/material. Material removed includes Callus, Subcutaneous Tissue, Slough, Skin: Dermis, and Skin: Epidermis after achieving pain control using Lidocaine 4% Topical Solution. A time out was conducted at 14:55, prior to the start of the procedure. A Minimum amount of bleeding was controlled with Pressure. The procedure was tolerated well with a pain level of 0 throughout and a pain level of 0 following the procedure. Post Debridement Measurements: 1.7cm length x 0.7cm width x 0.1cm depth; 0.093cm^3 volume. Character of Wound/Ulcer Post Debridement is improved. Severity of Tissue Post Debridement is: Fat layer exposed. Post procedure Diagnosis Wound #3: Same as Pre-Procedure [name], [name] ([id number]) [phone].pdf Page 9 of 11 Plan Follow-up Appointments: Return Appointment in 1 week. - Dr Mikey Bussing Room 8 0800 08/14/2023 *****CAST****** Return Appointment in 2 weeks. - Dr. Mikey Bussing room 8 *****Cast**** Return appointment in 3 weeks. - Dr Mikey Bussing Room 8 *****CAST****** Anesthetic: (In clinic) Topical Lidocaine 4% applied to wound bed Cellular or Tissue Based Products: Cellular or Tissue Based Product Type: - apligraf and grafix 50% coinsurance 02/28/23-apply 1st Grafix 03/07/2023 HOLD THIS WEEK. Bathing/ Shower/ Hygiene: May shower with protection but do not get wound dressing(s) wet. Protect dressing(s) with water repellant cover (for example, large plastic bag) or a cast cover and  may then take shower. Off-Loading: T Contact Cast to Left Lower Extremity - size 3 heel cup to toes. foam pad to anterior ankle-HOLD TODAY 08/12/2023 DUE TO NON AVALIABLE. otal Open toe surgical shoe to: - with Peg assist. WOUND #3: - Foot Wound Laterality: Plantar, Left Cleanser: Soap and Water 1 x Per Week/30 Days Discharge Instructions: May shower and wash wound with dial antibacterial soap and water prior to dressing change. Cleanser: Vashe 5.8 (oz) 1 x Per Week/30 Days Discharge Instructions: Cleanse the wound with Vashe prior to applying a clean dressing using gauze sponges, not tissue or cotton balls. Prim Dressing: Hydrofera Blue Ready Transfer Foam, 2.5x2.5 (in/in) 1 x Per Week/30 Days ary Discharge Instructions: Apply directly to wound bed as directed Secondary Dressing: ABD Pad, 5x9 1 x Per Week/30 Days Secondary Dressing: Zetuvit Plus 4x8 in 1 x Per Week/30 Days Discharge Instructions: Apply over primary dressing as directed. Secured With: [employer], 4.5x3.1 (in/yd) 1 x Per Week/30 Days Discharge Instructions: Secure with Kerlix as directed. Secured With: 60M Medipore H Soft Cloth Surgical T ape, 4 x 10 (in/yd) 1 x Per Week/30 Days Discharge Instructions: Secure with tape as directed. 1. In office sharp debridement 2. Hydrofera  Blue 3. Aggressive offloadingsurgical shoe 4. Follow-up later this week, if no cast then next week Electronic Signature(s) Signed: 08/12/2023 4:37:08 PM By: Glenn Corwin DO Entered By: [name] on 08/12/2023 15:38:59 -------------------------------------------------------------------------------- HxROS Details Patient Name: Date of Service: [name] IN, [name] 08/12/2023 2:00 PM Medical Record Number: [id number] Patient Account Number: [id number] Date of Birth/Sex: Treating RN: 06-Oct-1971 (52 y.o. M) Primary Care Provider: Shan [name] Other Clinician: Referring Provider: Treating Provider/Extender: [name] in Treatment: 28 Information Obtained From Patient Eyes Medical History: Negative for: Cataracts; Glaucoma; Optic Neuritis Ear/Nose/Mouth/Throat Medical History: Positive for: Chronic sinus problems/congestion Negative for: Middle ear problems Hematologic/Lymphatic Medical History: Negative for: Anemia; Hemophilia; Human Immunodeficiency Virus; Lymphedema; Sickle Cell Disease [name], [name] ([id number]) [id number] Page 10 of 11 Respiratory Medical History: Negative for: Aspiration; Asthma; Chronic Obstructive Pulmonary Disease (COPD); Pneumothorax; Sleep Apnea; Tuberculosis Cardiovascular Medical History: Positive for: Congestive Heart Failure; Coronary Artery Disease; Hypertension; Myocardial Infarction - age 44 Negative for: Angina; Arrhythmia; Deep Vein Thrombosis; Hypotension; Peripheral Arterial Disease; Peripheral Venous Disease; Phlebitis; Vasculitis Past Medical History Notes: Ischemic Cardiomyopathy Gastrointestinal Medical History: Negative for: Cirrhosis ; Colitis; Crohns; Hepatitis A; Hepatitis B; Hepatitis C Endocrine Medical History: Positive for: Type II Diabetes Time with diabetes: ten years Treated with: Insulin Blood sugar tested every day: No Genitourinary Medical History: Negative for: End Stage Renal Disease Past Medical History Notes: Renal Insufficiency Immunological Medical History: Negative for: Lupus Erythematosus; Raynauds; Scleroderma Integumentary (Skin) Medical History: Negative for: History of Burn Musculoskeletal Medical History: Negative for: Gout; Rheumatoid Arthritis; Osteoarthritis; Osteomyelitis Neurologic Medical History: Positive for: Neuropathy Negative for: Dementia; Quadriplegia; Paraplegia; Seizure Disorder Oncologic Medical History: Negative for: Received Chemotherapy; Received Radiation Psychiatric Medical History: Negative for: Anorexia/bulimia; Confinement Anxiety HBO  Extended History Items Ear/Nose/Mouth/Throat: Chronic sinus problems/congestion Immunizations Pneumococcal Vaccine: Received Pneumococcal Vaccination: No Implantable Devices None Hospitalization / Surgery History [name], [name] ([id number]) [phone].pdf Page 11 of 11 Type of Hospitalization/Surgery Heart Cath in 2018 Family and Social History Cancer: No; Diabetes: No; Heart Disease: Yes - Mother,Father; Hereditary Spherocytosis: No; Hypertension: No; Kidney Disease: No; Lung Disease: No; Seizures: No; Stroke: No; Thyroid Problems: No; Tuberculosis: No; Never smoker; Marital Status - Single; Alcohol Use: Rarely; Drug Use: No History; Caffeine Use: Rarely; Financial Concerns: No; Food, Clothing or Shelter Needs: No; Support System Lacking: No; Transportation Concerns: No Electronic Signature(s) Signed: 08/12/2023 4:37:08 PM By: Glenn Corwin DO Entered By: [name] on 08/12/2023 15:35:04 -------------------------------------------------------------------------------- SuperBill Details Patient Name: Date of Service: [name] IN, [name] 08/12/2023 Medical Record Number: [id number] Patient Account Number: [id number] Date of Birth/Sex: Treating RN: [birth year] (52 y.o. [name], Yvonne Kendall Primary Care Provider: Shan [name] Other Clinician: Referring Provider: Treating Provider/Extender: [name] in Treatment: 28 Diagnosis Coding ICD-10 Codes Code Description [phone] 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.621 Type 2 diabetes mellitus with foot ulcer Z89.422 Acquired absence of other left toe(s) Facility Procedures : CPT4 Code: [id number] Description: 11042 - DEB SUBQ TISSUE 20 SQ CM/< ICD-10 Diagnosis Description L97.522 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.621 Type 2 diabetes mellitus with foot ulcer Modifier: Quantity: 1 Physician  Procedures : CPT4 Code Description Modifier [id number] 11042 - WC PHYS SUBQ TISS 20 SQ CM ICD-10 Diagnosis Description L97.522 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.621 Type 2 diabetes mellitus with foot ulcer Quantity: 1 Electronic Signature(s) Signed: 08/12/2023  4:37:08 PM By: Glenn Corwin DO Entered By: [name] on 08/12/2023 15:39:09

## 2023-08-21 ENCOUNTER — Encounter (HOSPITAL_BASED_OUTPATIENT_CLINIC_OR_DEPARTMENT_OTHER): Payer: 59 | Admitting: Internal Medicine

## 2023-08-21 DIAGNOSIS — I130 Hypertensive heart and chronic kidney disease with heart failure and stage 1 through stage 4 chronic kidney disease, or unspecified chronic kidney disease: Secondary | ICD-10-CM | POA: Diagnosis not present

## 2023-08-21 DIAGNOSIS — E785 Hyperlipidemia, unspecified: Secondary | ICD-10-CM | POA: Diagnosis not present

## 2023-08-21 DIAGNOSIS — E11621 Type 2 diabetes mellitus with foot ulcer: Secondary | ICD-10-CM | POA: Diagnosis not present

## 2023-08-21 DIAGNOSIS — Z89422 Acquired absence of other left toe(s): Secondary | ICD-10-CM | POA: Diagnosis not present

## 2023-08-21 DIAGNOSIS — I2510 Atherosclerotic heart disease of native coronary artery without angina pectoris: Secondary | ICD-10-CM | POA: Diagnosis not present

## 2023-08-21 DIAGNOSIS — L97522 Non-pressure chronic ulcer of other part of left foot with fat layer exposed: Secondary | ICD-10-CM

## 2023-08-21 DIAGNOSIS — I5042 Chronic combined systolic (congestive) and diastolic (congestive) heart failure: Secondary | ICD-10-CM | POA: Diagnosis not present

## 2023-08-21 DIAGNOSIS — I252 Old myocardial infarction: Secondary | ICD-10-CM | POA: Diagnosis not present

## 2023-08-21 DIAGNOSIS — Z955 Presence of coronary angioplasty implant and graft: Secondary | ICD-10-CM | POA: Diagnosis not present

## 2023-08-21 DIAGNOSIS — E1122 Type 2 diabetes mellitus with diabetic chronic kidney disease: Secondary | ICD-10-CM | POA: Diagnosis not present

## 2023-08-21 DIAGNOSIS — Z794 Long term (current) use of insulin: Secondary | ICD-10-CM | POA: Diagnosis not present

## 2023-08-21 DIAGNOSIS — N189 Chronic kidney disease, unspecified: Secondary | ICD-10-CM | POA: Diagnosis not present

## 2023-08-25 ENCOUNTER — Other Ambulatory Visit: Payer: Self-pay

## 2023-08-25 ENCOUNTER — Other Ambulatory Visit: Payer: Self-pay | Admitting: Critical Care Medicine

## 2023-08-25 MED ORDER — DAPAGLIFLOZIN PROPANEDIOL 10 MG PO TABS
10.0000 mg | ORAL_TABLET | Freq: Every day | ORAL | 2 refills | Status: DC
  Filled 2023-08-25 – 2023-09-03 (×5): qty 30, 30d supply, fill #0
  Filled 2023-09-29: qty 30, 30d supply, fill #1
  Filled 2023-11-03: qty 30, 30d supply, fill #2

## 2023-08-25 NOTE — Progress Notes (Signed)
SEMISI, HARDEY ([id number]) [id number] Page 1 of 11 Visit Report for 08/21/2023 Chief Complaint Document Details Patient Name: Date of Service: [name], [name] RO LD 08/21/2023 8:45 A M Medical Record Number: [id number] Patient Account Number: [id number] Date of Birth/Sex: Treating RN: [birth year] (52 y.o. M) Primary Care Provider: [name] Other Clinician: Referring Provider: Treating Provider/Extender: [name] in Treatment: 29 Information Obtained from: Patient Chief Complaint 01/15/2023; left foot wound Electronic Signature(s) Signed: 08/25/2023 1:35:31 PM By: Geralyn Corwin DO Entered By: Geralyn Corwin on 08/21/2023 07:05:18 -------------------------------------------------------------------------------- Debridement Details Patient Name: Date of Service: Glenn Donath [place], [name] 08/21/2023 8:45 A M Medical Record Number: [id number] Patient Account Number: [id number] Date of Birth/Sex: Treating RN: Aug 22, 1971 (52 y.o. [name], [name] Primary Care Provider: [name] Other Clinician: Referring Provider: Treating Provider/Extender: [name] in Treatment: 29 Debridement Performed for Assessment: Wound #3 Left,Plantar Foot Performed By: Physician Geralyn Corwin, DO Debridement Type: Debridement Severity of Tissue Pre Debridement: Fat layer exposed Level of Consciousness (Pre-procedure): Awake and Alert Pre-procedure Verification/Time Out Yes - 09:25 Taken: Start Time: 09:26 Pain Control: Lidocaine 4% T opical Solution Percent of Wound Bed Debrided: 100% T Area Debrided (cm): otal 0.93 Tissue and other material debrided: Viable, Non-Viable, Callus, Slough, Subcutaneous, Skin: Dermis , Skin: Epidermis, Slough Level: Skin/Subcutaneous Tissue Debridement Description: Excisional Instrument: Curette Bleeding: Minimum Hemostasis Achieved: Pressure End Time: 09:30 Procedural  Pain: 0 Post Procedural Pain: 0 Response to Treatment: Procedure was tolerated well Level of Consciousness (Post- Awake and Alert procedure): Post Debridement Measurements of Total Wound Length: (cm) 1.7 Width: (cm) 0.7 Depth: (cm) 0.2 Volume: (cm) 0.187 Character of Wound/Ulcer Post Debridement: Improved [name] ([id number]) [id number] Page 2 of 11 Severity of Tissue Post Debridement: Fat layer exposed Post Procedure Diagnosis Same as Pre-procedure Electronic Signature(s) Signed: 08/21/2023 5:48:32 PM By: Shawn Stall RN, BSN Signed: 08/25/2023 1:35:31 PM By: Geralyn Corwin DO Entered By: Shawn Stall on 08/21/2023 06:30:50 -------------------------------------------------------------------------------- HPI Details Patient Name: Date of Service: Glenn Donath IN, [name] 08/21/2023 8:45 A M Medical Record Number: [id number] Patient Account Number: [id number] Date of Birth/Sex: Treating RN: 1971-07-26 (52 y.o. M) Primary Care Provider: [name] Other Clinician: Referring Provider: Treating Provider/Extender: [name] in Treatment: 29 History of Present Illness HPI Description: ADMISSION 01/11/2020 This is a 52 year old man with uncontrolled type 2 diabetes with a recent hemoglobin A1c earlier this year of 13.4. He is on insulin and glipizide. He does not take his blood sugars at home. He does have a follow-up with primary care later this month I believe on January 27. He tells [place] that roughly a month ago he was walking with a shoe with a hole in his foot. He took the shoe off and there was an open wound at roughly the left fourth met head. This has significant undermining and raised edges. He has not noticed any purulence he does not feel unwell. More recently he was taking skin off the bottom of his foot and has a superficial area on the left fifth met head. He has not been offloading this. The patient was in  the ER on 12/20. They gave him Bactroban which she has been using on the wound and 10 days worth of doxycycline. No x-rays were done. He has not had vascular studies. He is also been using hydrogen peroxide. Past medical history type 2 diabetes uncontrolled, chronic systolic heart failure, coronary artery disease with a history of congestive heart  failure with stents. Hypertension hyperlipidemia and chronic renal insufficiency ABI in this clinic was 1.14 on the left. 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 He is uncertain whether he would be able to work if we put him on some form of restriction 1/19; he is generally doing quite well. Using silver alginate on the wounds. Things actually look better. He has a forefoot offloading boot which she seems to be compliant about. He has support at work to stay off his foot is much as possible which is gratifying. Culture I did last week showed a few Enterococcus faecalis. I am going to put him on Augmentin. I talked about ordering an x-ray in my note last week but that does not seem to have happened. We will review reorder the x-ray this week. 1/26; x-ray reordered last week was negative for osteomyelitis. We are using silver alginate on the wound on the third and fifth met heads on the left. He is using a Darco forefoot off loader 2/2; the area on the fifth met head is closed. Third met head is still open with tunneling depth and thick callus. 2/9; the area on the fifth met head remains closed however the third met head again has a small open area on presentation with tunneling in depth and surrounded by thick callus. This looks like a pressure issue. We have been using silver alginate 2/16; the area of the fifth metatarsal head remains closed however the area over the third metatarsal head again is a small open area but with some depth. I do not think this is changed much since last week. He is using Hydrofera Blue with  forefoot off loader. He is not able to use a total contact cast on the left leg because he needs his left leg at work [employer]]. Fortunately the wound does not look infected. I changed him to endoform today 2/26; the area of the fifth metatarsal head remains closed. The area of the third metatarsal head has an even smaller opening this time. I used endoform on this this looks improved. He is offloading this is much as he can and a forefoot off loader on the left. He cannot have a total contact cast because of work responsibilities 3/5; the area on the fifth metatarsal head remains closed the area on the third metatarsal head is also closed on the left foot. 01/15/2023 Mr. 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 He has been following for podiatry for several months for this issue. He is currently using wet-to-dry saline dressings. He currently denies signs of infection. Progresse note from 1/17; Patient presents with a 31-month history of nonhealing ulcer to the left foot secondary to diabetes and inability to offload well the area. He has had multiple debridements in the past to his feet bilaterally. He has had resection of the fourth left met head in the fifth toe Secondary to osteomyelitis. We discussed the importance of glycemic control for wound healing. Due to his blood glucose levels being elevated he is at high risk for infection and thus further amputation. He expressed understanding. He states he is supposed to be referred to an endocrinologist at  [facility] however the referral fell through. Unclear what happened. Offered a referral to  endocrinology at [facility]. Patient was agreeable.Furthermore we discussed the importance of aggressive offloading for his wound healing. This will be the most difficult part of the treatment plan for the patient to do. We discussed a total contact cast however he has declined that at this time. 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 He is currently using a surgical shoe with a peg assist. It does not fit well so we will give him a new one today. I do not think this is enough offloading. If he is not able to offload this area he will likely end up with a BKA. He is well aware of this. For now I recommended Medihoney and Hydrofera Blue for dressing changes. He will follow-up in our [place] office Since this is closer for him. [name], [name] ([id number]) [id number] Page 3 of 11 1/26; patient presents for follow-up. Last clinic visit I had seen him in [place] and we transferred him to [place] since this is a closer location for him. Progress note above from that visit. Over the past week He has been using Medihoney and Hydrofera Blue to the wound bed. He has been using his surgical shoe with peg assist. He has no issues or complaints today. We discussed doing the total contact cast and he was agreeable to having this placed at next clinic visit. 2/2; patient presents for follow-up. He has been using Vashe wet-to-dry dressings. Plan is for the total contact cast today. 2/5; patient presents for follow-up. The cast was placed 3 days ago. He tolerated this well however had a lot of drainage. He currently denies signs of infection. 2/9; patient presents for follow-up. At last clinic visit we held off on replacing the cast due to drainage. He has been using Vashe wet-to-dry dressings and he is currently taking the oral antibiotics prescribed without issues. He declines a total contact cast today. We  ran insurance verification for skin substitute and due to cost patient declines having this placed. 2/16; patient presents for follow-up. He has decided not to follow-up with podiatry. He continues to decline the total contact cast. He has been using Vashe wet- to-dry dressings. He has not been wearing his surgical shoe with peg assist. He currently denies signs of infection. 3/1; patient presents for follow-up. He has been doing Vashe wet-to-dry dressings. He has a surgical shoe with peg assist. We discussed potentially doing a skin substitute for which she has been approved for by insurance. He knows the out-of-pocket cost of this and would still like to proceed with having this placed today. He denies the current total contact cast. 3/8; patient presents for follow-up. He states that the skin substitute came off after a few days and he has been using Vashe wet-to-dry dressings. He has mild odor to the wound bed on exam. 3/15; patient presents for follow-up. He has been using Vashe wet-to-dry dressings and started his course of Augmentin. He reports improvement in wound healing. He has no issues or complaints today. 3/21; patient presents for follow-up. He continues to use Vashe wet-to-dry dressings. He has no issues or complaints today. 3/28; patient presents for follow-up. He has been using Vashe wet-to-dry dressings. He has no issues or complaints today. He has been using his peg assist with surgical shoe. 4/11; patient presents for follow-up. He has been  using Vashe wet-to-dry dressings. The periwound is macerated. He has been using his peg assist with surgical shoe. He has no issues or complaints today. 4/25; patient presents for follow-up. Has been using Vashe wet-to-dry dressings. He has been using zinc oxide to the periwound. For offloading he has been using his peg assist in surgical shoe. He has no issues or complaints today. 5/9; patient presents for follow-up. He has been using Vashe  wet-to-dry dressings. He has no issues or complaints today. He has a surgical shoe with peg assist for offloading. 5/23; patient presents for follow up. He has been using Vashe to clean the wound bed and collagen and blast X with dressing changes. [place] the [place] 2 [place]. Wound is bigger. 5/31; patient presents for follow-up. He has been using Vashe wet-to-dry dressings. Wound is stable but with healthier granulation tissue. Patient would like to hold off on the total contact cast and try a knee scooter for offloading. 6/13; patient presents for follow-up. He has been using Vashe wet-to-dry dressings. He has been using a knee scooter for offloading however states he felt unstable on this and decided to buy an electric scooter. This arrived in the mail today. He would like to hold off on the left total contact cast. Wound is slightly smaller today. 6/27; patient presents for follow-up. He has been using Vashe wet-to-dry dressings. He is using an Art gallery manager for mobility to help relieve the pressure off the wound bed. Wound is smaller today. He wants to hold off on doing the total contact cast as he is showing signs of improvement with wound healing. 7/11; patient presents for follow-up. He has been using Vashe wet-to-dry dressings and his electric scooter for offloading. Wound is slightly smaller. 7/25; patient presents for follow up. He has been using Vashe wet-to-dry dressings and he reports using his electric scooter for offloading. Wound is larger. 7/30; patient presents for follow-up. He has been using Vashe wet-to-dry dressings. He has taken Augmentin as prescribed without issues. Overall wound has healthier granulation tissue. 8/1; patient presents for follow-up. He presents for obligatory cast change. He tolerated the cast well and has no issues or complaints today. We have been using antibiotic ointment with Hydrofera Blue under the cast. Wound is slightly smaller. 8/8; patient  presents for follow-up. We have been using antibiotic ointment with Hydrofera Blue under the total contact cast. Wound is smaller. He has no issues or complaints today. 8/13; patient presents for follow-up. We have been using antibiotic ointment with Hydrofera Blue under the total contact cast. He comes in today because the top part of the cast over the foot has sunken as well as on the side. He wanted to assure that there were no issues. 8/22; patient presents for follow-up. He has been using Hydrofera Blue to the wound bed. Previously we have been using a total contact cast. We do not have total contact casts due to manufacturing issues available for patients. He has no issues or complaints. He denies signs of infection. Electronic Signature(s) Signed: 08/25/2023 1:35:31 PM By: Geralyn Corwin DO Entered By: Geralyn Corwin on 08/21/2023 07:06:19 -------------------------------------------------------------------------------- Physical Exam Details Patient Name: Date of Service: Glenn Donath [place], [name] 08/21/2023 8:45 A [name], [name] ([id number]) [id number] Page 4 of 11 Medical Record Number: [id number] Patient Account Number: [id number] Date of Birth/Sex: Treating RN: [birth year] (52 y.o. M) Primary Care Provider: [name] Other Clinician: Referring Provider: Treating Provider/Extender: [name] in Treatment: 73  Constitutional respirations regular, non-labored and within target range for patient.. Cardiovascular 2+ dorsalis pedis/posterior tibialis pulses. Psychiatric pleasant and cooperative. Notes Left foot: T the lateral aspect fifth met head there is an open wound with granulation tissue, hypergranulated areas, slough and callus. No erythema or o increased warmth to the periwound. Electronic Signature(s) Signed: 08/25/2023 1:35:31 PM By: Geralyn Corwin DO Entered By: Geralyn Corwin on 08/21/2023  07:12:57 -------------------------------------------------------------------------------- Physician Orders Details Patient Name: Date of Service: Glenn Donath [place], [name] 08/21/2023 8:45 A M Medical Record Number: [id number] Patient Account Number: [id number] Date of Birth/Sex: Treating RN: 1971/02/07 (52 y.o. [name] Primary Care Provider: [name] Other Clinician: Referring Provider: Treating Provider/Extender: [name] in Treatment: 91 Verbal / Phone Orders: No Diagnosis Coding Follow-up Appointments ppointment in 1 week. - Dr Mikey Bussing Room 8 0845 room 9 08/28/2023 Return A ppointment in 2 weeks. - Dr. Mikey Bussing room 8 09/04/2023 (please schedule for patient) Return A Return appointment in 3 weeks. - Dr Mikey Bussing Room 8 (please schedule for patient) Return appointment in 1 month. - Dr .Mikey Bussing Room 8 (please schedule for patient) Anesthetic (In clinic) Topical Lidocaine 4% applied to wound bed Cellular or Tissue Based Products Cellular or Tissue Based Product Type: - apligraf and grafix 50% coinsurance 02/28/23-apply 1st Grafix 03/07/2023 HOLD THIS WEEK. Bathing/ Shower/ Hygiene May shower with protection but do not get wound dressing(s) wet. Protect dressing(s) with water repellant cover (for example, large plastic bag) or a cast cover and may then take shower. Off-Loading Total Contact Cast to Left Lower Extremity - size 3 heel cup to toes. foam pad to anterior ankle-HOLD DUE TO NON AVALIABLE. Open toe surgical shoe to: - with Peg assist. add felt to shoe to aid in offloading. use knee scooter when you need to. Wound Treatment Wound #3 - Foot Wound Laterality: Plantar, Left Cleanser: Soap and Water 1 x Per Day/30 Days Discharge Instructions: May shower and wash wound with dial antibacterial soap and water prior to dressing change. Cleanser: Vashe 5.8 (oz) 1 x Per Day/30 Days Discharge Instructions: Cleanse the wound with Vashe prior to  applying a clean dressing using gauze sponges, not tissue or cotton balls. Prim Dressing: Vashe wet to dry 1 x Per Day/30 Days ary Discharge Instructions: vashe moisten gauze to wound bed. [name], [name] ([id number]) [id number] Page 5 of 11 Prim Dressing: 2x2 gauze 1 x Per Day/30 Days ary Discharge Instructions: moisten with vashe. Secondary Dressing: ABD Pad, 5x9 1 x Per Day/30 Days Secured With: Kerlix Roll Sterile, 4.5x3.1 (in/yd) 1 x Per Day/30 Days Discharge Instructions: Secure with Kerlix as directed. Secured With: 61M Medipore H Soft Cloth Surgical T ape, 4 x 10 (in/yd) 1 x Per Day/30 Days Discharge Instructions: Secure with tape as directed. Electronic Signature(s) Signed: 08/25/2023 1:35:31 PM By: Geralyn Corwin DO Entered By: Geralyn Corwin on 08/21/2023 07:13:06 -------------------------------------------------------------------------------- Problem List Details Patient Name: Date of Service: Glenn Donath [place], [name] 08/21/2023 8:45 A M Medical Record Number: [id number] Patient Account Number: [id number] Date of Birth/Sex: Treating RN: [birth year] (52 y.o. M) Primary Care Provider: [name] Other Clinician: Referring Provider: Treating Provider/Extender: [name] in Treatment: 29 Active Problems ICD-10 Encounter Code Description Active Date MDM Diagnosis [phone] Non-pressure chronic ulcer of other part of left foot with fat layer exposed 01/24/2023 No Yes E11.621 Type 2 diabetes mellitus with foot ulcer 01/24/2023 No Yes Z89.422 Acquired absence of other left toe(s) 01/24/2023 No Yes Inactive Problems Resolved Problems  Electronic Signature(s) Signed: 08/25/2023 1:35:31 PM By: Geralyn Corwin DO Entered By: Geralyn Corwin on 08/21/2023 07:04:55 -------------------------------------------------------------------------------- Progress Note Details Patient Name: Date of Service: Glenn Donath IN, [name]  08/21/2023 8:45 A M Medical Record Number: [id number] Patient Account Number: [id number] Date of Birth/Sex: Treating RN: 1971-01-12 (52 y.o. Particia Nearing, [name] ([id number]) [id number] Page 6 of 11 Primary Care Provider: [name] Other Clinician: Referring Provider: Treating Provider/Extender: [name] in Treatment: 29 Subjective Chief Complaint Information obtained from Patient 01/15/2023; left foot wound History of Present Illness (HPI) ADMISSION 01/11/2020 This is a 52 year old man with uncontrolled type 2 diabetes with a recent hemoglobin A1c earlier this year of 13.4. He is on insulin and glipizide. He does not take his blood sugars at home. He does have a follow-up with primary care later this month I believe on January 27. He tells [place] that roughly a month ago he was walking with a shoe with a hole in his foot. He took the shoe off and there was an open wound at roughly the left fourth met head. This has significant undermining and raised edges. He has not noticed any purulence he does not feel unwell. More recently he was taking skin off the bottom of his foot and has a superficial area on the left fifth met head. He has not been offloading this. The patient was in the ER on 12/20. They gave him Bactroban which she has been using on the wound and 10 days worth of doxycycline. No x-rays were done. He has not had vascular studies. He is also been using hydrogen peroxide. Past medical history type 2 diabetes uncontrolled, chronic systolic heart failure, coronary artery disease with a history of congestive heart failure with stents. Hypertension hyperlipidemia and chronic renal insufficiency ABI in this clinic was 1.14 on the left. 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 He is uncertain whether he would be able to work if we put him on some form of restriction 1/19; he is generally doing quite  well. Using silver alginate on the wounds. Things actually look better. He has a forefoot offloading boot which she seems to be compliant about. He has support at work to stay off his foot is much as possible which is gratifying. Culture I did last week showed a few Enterococcus faecalis. I am going to put him on Augmentin. I talked about ordering an x-ray in my note last week but that does not seem to have happened. We will review reorder the x-ray this week. 1/26; x-ray reordered last week was negative for osteomyelitis. We are using silver alginate on the wound on the third and fifth met heads on the left. He is using a Darco forefoot off loader 2/2; the area on the fifth met head is closed. Third met head is still open with tunneling depth and thick callus. 2/9; the area on the fifth met head remains closed however the third met head again has a small open area on presentation with tunneling in depth and surrounded by thick callus. This looks like a pressure issue. We have been using silver alginate 2/16; the area of the fifth metatarsal head remains closed however the area over the third metatarsal head again is a small open area but with some depth. I do not think this is changed much since last week. He is using Hydrofera Blue with forefoot off loader. He is not able to use a total contact  cast on the left leg because he needs his left leg at work [employer]]. Fortunately the wound does not look infected. I changed him to endoform today 2/26; the area of the fifth metatarsal head remains closed. The area of the third metatarsal head has an even smaller opening this time. I used endoform on this this looks improved. He is offloading this is much as he can and a forefoot off loader on the left. He cannot have a total contact cast because of work responsibilities 3/5; the area on the fifth metatarsal head remains closed the area on the third metatarsal head is also closed on the left  foot. 01/15/2023 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 He has been following for podiatry for several months for this issue. He is currently using wet-to-dry saline dressings. He currently denies signs of infection. Progresse note from 1/17; Patient presents with a 39-month history of nonhealing ulcer to the left foot secondary to diabetes and inability to offload well the area. He has had multiple debridements in the past to his feet bilaterally. He has had resection of the fourth left met head in the fifth toe Secondary to osteomyelitis. We discussed the importance of glycemic control for wound healing. Due to his blood glucose levels being elevated he is at high risk for infection and thus further amputation. He expressed understanding. He states he is supposed to be referred to an endocrinologist at [facility] however the referral fell through. Unclear what happened. Offered a referral to endocrinology at [facility]. Patient was agreeable.Furthermore we discussed the importance of aggressive offloading for his wound healing. This will be the most difficult part of the treatment plan for the patient to do. We discussed a total contact cast however he has declined that at this time. 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 He is currently using a surgical shoe with a peg assist. It does not fit well so we will give him a new one today. I do not think this is enough offloading. If he is not able to offload this area he will likely end up with a BKA. He is well aware of this. For now I  recommended Medihoney and Hydrofera Blue for dressing changes. He will follow-up in our [place] office Since this is closer for him. 1/26; patient presents for follow-up. Last clinic visit I had seen him in [place] and we transferred him to [name] since this is a closer location for him. Progress note above from that visit. Over the past week He has been using Medihoney and Hydrofera Blue to the wound bed. He has been using his surgical shoe with peg assist. He has no issues or complaints today. We discussed doing the total contact cast and he was agreeable to having this placed at next clinic visit. 2/2; patient presents for follow-up. He has been using Vashe wet-to-dry dressings. Plan is for the total contact cast today. 2/5; patient presents for follow-up. The cast was placed 3 days ago. He tolerated this well however had a lot of drainage. He currently denies signs of  infection. 2/9; patient presents for follow-up. At last clinic visit we held off on replacing the cast due to drainage. He has been using Vashe wet-to-dry dressings and he is currently taking the oral antibiotics prescribed without issues. He declines a total contact cast today. We ran insurance verification for skin substitute and due to cost patient declines having this placed. 2/16; patient presents for follow-up. He has decided not to follow-up with podiatry. He continues to decline the total contact cast. He has been using Vashe wet- to-dry dressings. He has not been wearing his surgical shoe with peg assist. He currently denies signs of infection. 3/1; patient presents for follow-up. He has been doing Vashe wet-to-dry dressings. He has a surgical shoe with peg assist. We discussed potentially doing a skin substitute for which she has been approved for by insurance. He knows the out-of-pocket cost of this and would still like to proceed with having this placed today. He denies the current total contact cast. 3/8;  patient presents for follow-up. He states that the skin substitute came off after a few days and he has been using Vashe wet-to-dry dressings. He has mild odor to the wound bed on exam. [name], [name] ([id number]) [id number] Page 7 of 11 3/15; patient presents for follow-up. He has been using Vashe wet-to-dry dressings and started his course of Augmentin. He reports improvement in wound healing. He has no issues or complaints today. 3/21; patient presents for follow-up. He continues to use Vashe wet-to-dry dressings. He has no issues or complaints today. 3/28; patient presents for follow-up. He has been using Vashe wet-to-dry dressings. He has no issues or complaints today. He has been using his peg assist with surgical shoe. 4/11; patient presents for follow-up. He has been using Vashe wet-to-dry dressings. The periwound is macerated. He has been using his peg assist with surgical shoe. He has no issues or complaints today. 4/25; patient presents for follow-up. Has been using Vashe wet-to-dry dressings. He has been using zinc oxide to the periwound. For offloading he has been using his peg assist in surgical shoe. He has no issues or complaints today. 5/9; patient presents for follow-up. He has been using Vashe wet-to-dry dressings. He has no issues or complaints today. He has a surgical shoe with peg assist for offloading. 5/23; patient presents for follow up. He has been using Vashe to clean the wound bed and collagen and blast X with dressing changes. [place] the [place]. Wound is bigger. 5/31; patient presents for follow-up. He has been using Vashe wet-to-dry dressings. Wound is stable but with healthier granulation tissue. Patient would like to hold off on the total contact cast and try a knee scooter for offloading. 6/13; patient presents for follow-up. He has been using Vashe wet-to-dry dressings. He has been using a knee scooter for offloading  however states he felt unstable on this and decided to buy an electric scooter. This arrived in the mail today. He would like to hold off on the left total contact cast. Wound is slightly smaller today. 6/27; patient presents for follow-up. He has been using Vashe wet-to-dry dressings. He is using an Art gallery manager for mobility to help relieve the pressure off the wound bed. Wound is smaller today. He wants to hold off on doing the total contact cast as he is showing signs of improvement with wound healing. 7/11; patient presents for follow-up. He has been using Vashe wet-to-dry dressings and his electric scooter for offloading. Wound is slightly smaller.  7/25; patient presents for follow up. He has been using Vashe wet-to-dry dressings and he reports using his electric scooter for offloading. Wound is larger. 7/30; patient presents for follow-up. He has been using Vashe wet-to-dry dressings. He has taken Augmentin as prescribed without issues. Overall wound has healthier granulation tissue. 8/1; patient presents for follow-up. He presents for obligatory cast change. He tolerated the cast well and has no issues or complaints today. We have been using antibiotic ointment with Hydrofera Blue under the cast. Wound is slightly smaller. 8/8; patient presents for follow-up. We have been using antibiotic ointment with Hydrofera Blue under the total contact cast. Wound is smaller. He has no issues or complaints today. 8/13; patient presents for follow-up. We have been using antibiotic ointment with Hydrofera Blue under the total contact cast. He comes in today because the top part of the cast over the foot has sunken as well as on the side. He wanted to assure that there were no issues. 8/22; patient presents for follow-up. He has been using Hydrofera Blue to the wound bed. Previously we have been using a total contact cast. We do not have total contact casts due to manufacturing issues available for  patients. He has no issues or complaints. He denies signs of infection. Patient History Information obtained from Patient. Family History Heart Disease - Mother,Father, No family history of Cancer, Diabetes, Hereditary Spherocytosis, Hypertension, Kidney Disease, Lung Disease, Seizures, Stroke, Thyroid Problems, Tuberculosis. Social History Never smoker, Marital Status - Single, Alcohol Use - Rarely, Drug Use - No History, Caffeine Use - Rarely. 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 74 Denies history of Angina, Arrhythmia, Deep Vein Thrombosis, Hypotension, Peripheral Arterial Disease, Peripheral Venous Disease, Phlebitis, Vasculitis Gastrointestinal Denies history of Cirrhosis , Colitis, Crohns, Hepatitis A, Hepatitis B, Hepatitis C Endocrine Patient has history of Type II Diabetes Genitourinary Denies history of End Stage Renal Disease Immunological Denies history of Lupus Erythematosus, Raynauds, Scleroderma Integumentary (Skin) Denies history of History of Burn Musculoskeletal Denies history of Gout, Rheumatoid Arthritis, Osteoarthritis, Osteomyelitis Neurologic Patient has history of Neuropathy Denies history of Dementia, Quadriplegia, Paraplegia, Seizure Disorder Oncologic Denies history of Received Chemotherapy, Received Radiation Psychiatric [name], [name] ([id number]) [id number] Page 8 of 11 Denies  history of Anorexia/bulimia, Confinement Anxiety Hospitalization/Surgery History - Heart Cath in 2018. Medical A Surgical History Notes nd Cardiovascular Ischemic Cardiomyopathy Genitourinary Renal Insufficiency Objective Constitutional respirations regular, non-labored and within target range for patient.. Vitals Time Taken: 9:05 AM, Height: 78 in, Weight: 275 lbs, BMI: 31.8, Temperature: 97.8 F, Pulse: 80 bpm, Respiratory Rate: 18 breaths/min, Blood Pressure: 112/79 mmHg. Cardiovascular 2+ dorsalis pedis/posterior tibialis pulses. Psychiatric pleasant and cooperative. General Notes: Left foot: T the lateral aspect fifth met head there is an open wound with granulation tissue, hypergranulated areas, slough and callus. No o erythema or increased warmth to the periwound. Integumentary (Hair, Skin) Wound #3 status is Open. Original cause of wound was Gradually Appeared. The date acquired was: 10/01/2022. The wound has been in treatment 29 weeks. The wound is located on the Left,Plantar Foot. The wound measures 1.7cm length x 0.7cm width x 0.1cm depth; 0.935cm^2 area and 0.093cm^3  volume. There is Fat Layer (Subcutaneous Tissue) exposed. There is no tunneling or undermining noted. There is a medium amount of serosanguineous drainage noted. The wound margin is thickened. There is large (67-100%) red, pink, hyper - granulation within the wound bed. There is no necrotic tissue within the wound bed. The periwound skin appearance exhibited: Callus. The periwound skin appearance did not exhibit: Crepitus, Excoriation, Induration, Rash, Scarring, Dry/Scaly, Maceration, Atrophie Blanche, Cyanosis, Ecchymosis, Hemosiderin Staining, Mottled, Pallor, Rubor, Erythema. Periwound temperature was noted as No Abnormality. 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 I debrided nonviable tissue. Unfortunately there is no total contact cast replacement today. We secured a felt pad to his surgical shoe to help with increased offloading. I recommended going back to Vashe wet-to-dry dressings as he did best with this when he was not being treated with a total contact cast. No signs of infection. Follow-up in 1 week. Procedures Wound #3 Pre-procedure diagnosis of Wound #3 is a Diabetic Wound/Ulcer of the Lower Extremity located on the Left,Plantar Foot .Severity of Tissue Pre Debridement is: Fat layer exposed. There was a Excisional Skin/Subcutaneous Tissue Debridement with a total area of 0.93 sq cm performed by Geralyn Corwin, DO. With the following instrument(s): Curette to remove Viable and Non-Viable tissue/material. Material removed includes Callus, Subcutaneous Tissue, Slough, Skin: Dermis, and Skin: Epidermis after achieving pain control using Lidocaine 4% Topical Solution. A time out was conducted at 09:25, prior to the start of the procedure. A Minimum amount of bleeding was controlled with Pressure. The procedure was tolerated well with a pain level of 0 throughout and a pain level of 0 following the procedure. Post Debridement Measurements: 1.7cm length x 0.7cm width x 0.2cm depth; 0.187cm^3 volume. Character of Wound/Ulcer Post Debridement is improved. Severity of Tissue Post Debridement is: Fat layer exposed. Post procedure Diagnosis Wound #3: Same as Pre-Procedure Plan OCIEL, [name] ([id number]) [phone].pdf Page 9 of 11 Follow-up Appointments: Return Appointment in 1 week. - Dr Mikey Bussing Room 8 0845 room 9 08/28/2023 Return Appointment in 2 weeks. - Dr. Mikey Bussing room 8 09/04/2023 (please schedule for patient) Return appointment in 3 weeks. - Dr Mikey Bussing Room 8 (please schedule for patient) Return appointment in 1 month. - Dr  .Mikey Bussing Room 8 (please schedule for patient) Anesthetic: (In clinic) Topical Lidocaine 4% applied to wound bed Cellular or Tissue Based Products: Cellular or Tissue Based Product Type: - apligraf and grafix 50% coinsurance 02/28/23-apply 1st Grafix 03/07/2023 HOLD THIS WEEK. Bathing/ Shower/ Hygiene: May shower with protection but do not get wound dressing(s) wet. Protect dressing(s) with water repellant cover (for example, large plastic bag) or a cast cover and may then take shower. Off-Loading: T Contact Cast to Left Lower Extremity - size 3 heel cup to toes. foam pad to anterior ankle-HOLD DUE TO NON AVALIABLE. otal Open toe surgical shoe to: - with Peg assist. add felt to shoe to aid in offloading. use knee scooter when you need to. WOUND #3: - Foot Wound Laterality: Plantar, Left Cleanser: Soap and Water 1 x Per Day/30 Days Discharge Instructions: May shower and wash wound with dial antibacterial soap and water prior to dressing change. Cleanser: Vashe 5.8 (oz) 1 x Per Day/30 Days Discharge Instructions: Cleanse the wound with Vashe prior to applying a clean dressing using  gauze sponges, not tissue or cotton balls. Prim Dressing: Vashe wet to dry 1 x Per Day/30 Days ary Discharge Instructions: vashe moisten gauze to wound bed. Prim Dressing: 2x2 gauze 1 x Per Day/30 Days ary Discharge Instructions: moisten with vashe. Secondary Dressing: ABD Pad, 5x9 1 x Per Day/30 Days Secured With: Kerlix Roll Sterile, 4.5x3.1 (in/yd) 1 x Per Day/30 Days Discharge Instructions: Secure with Kerlix as directed. Secured With: 42M Medipore H Soft Cloth Surgical T ape, 4 x 10 (in/yd) 1 x Per Day/30 Days Discharge Instructions: Secure with tape as directed. 1. In office sharp debridement 2. Vashe wet-to-dry dressings 3. Aggressive offloadingsurgical shoe and felt pad 4. Follow-up in 1 week Electronic Signature(s) Signed: 08/25/2023 1:35:31 PM By: Geralyn Corwin DO Entered By: Geralyn Corwin on  08/21/2023 07:14:45 -------------------------------------------------------------------------------- HxROS Details Patient Name: Date of Service: Glenn Donath IN, [name] 08/21/2023 8:45 A M Medical Record Number: [id number] Patient Account Number: [id number] Date of Birth/Sex: Treating RN: 02-20-1971 (52 y.o. M) Primary Care Provider: [name] Other Clinician: Referring Provider: Treating Provider/Extender: [name] in Treatment: 29 Information Obtained From Patient Eyes Medical History: Negative for: Cataracts; Glaucoma; Optic Neuritis Ear/Nose/Mouth/Throat Medical History: Positive for: Chronic sinus problems/congestion Negative for: Middle ear problems Hematologic/Lymphatic Medical History: Negative for: Anemia; Hemophilia; Human Immunodeficiency Virus; Lymphedema; Sickle Cell Disease Respiratory Medical HistoryJOHNY, [name] ([id number]) [id number] Page 10 of 11 Negative for: Aspiration; Asthma; Chronic Obstructive Pulmonary Disease (COPD); Pneumothorax; Sleep Apnea; Tuberculosis Cardiovascular Medical History: Positive for: Congestive Heart Failure; Coronary Artery Disease; Hypertension; Myocardial Infarction - age 61 Negative for: Angina; Arrhythmia; Deep Vein Thrombosis; Hypotension; Peripheral Arterial Disease; Peripheral Venous Disease; Phlebitis; Vasculitis Past Medical History Notes: Ischemic Cardiomyopathy Gastrointestinal Medical History: Negative for: Cirrhosis ; Colitis; Crohns; Hepatitis A; Hepatitis B; Hepatitis C Endocrine Medical History: Positive for: Type II Diabetes Time with diabetes: ten years Treated with: Insulin Blood sugar tested every day: No Genitourinary Medical History: Negative for: End Stage Renal Disease Past Medical History Notes: Renal Insufficiency Immunological Medical History: Negative for: Lupus Erythematosus; Raynauds; Scleroderma Integumentary (Skin) Medical  History: Negative for: History of Burn Musculoskeletal Medical History: Negative for: Gout; Rheumatoid Arthritis; Osteoarthritis; Osteomyelitis Neurologic Medical History: Positive for: Neuropathy Negative for: Dementia; Quadriplegia; Paraplegia; Seizure Disorder Oncologic Medical History: Negative for: Received Chemotherapy; Received Radiation Psychiatric Medical History: Negative for: Anorexia/bulimia; Confinement Anxiety HBO Extended History Items Ear/Nose/Mouth/Throat: Chronic sinus problems/congestion Immunizations Pneumococcal Vaccine: Received Pneumococcal Vaccination: No Implantable Devices None Hospitalization / Surgery History Type of Hospitalization/Surgery Heart Cath in 2018 [place] ([id number]) [phone].pdf Page 68 of 5 Family and Social History Cancer: No; Diabetes: No; Heart Disease: Yes - Mother,Father; Hereditary Spherocytosis: No; Hypertension: No; Kidney Disease: No; Lung Disease: No; Seizures: No; Stroke: No; Thyroid Problems: No; Tuberculosis: No; Never smoker; Marital Status - Single; Alcohol Use: Rarely; Drug Use: No History; Caffeine Use: Rarely; Financial Concerns: No; Food, Clothing or Shelter Needs: No; Support System Lacking: No; Transportation Concerns: No Electronic Signature(s) Signed: 08/25/2023 1:35:31 PM By: Geralyn Corwin DO Entered By: Geralyn Corwin on 08/21/2023 07:12:22 -------------------------------------------------------------------------------- SuperBill Details Patient Name: Date of Service: Glenn Donath IN, [name] 08/21/2023 Medical Record Number: [id number] Patient Account Number: [id number] Date of Birth/Sex: Treating RN: 11-05-1971 (52 y.o. [name] Primary Care Provider: [name] Other Clinician: Referring Provider: Treating Provider/Extender: [name] in Treatment: 29 Diagnosis Coding ICD-10 Codes Code Description [phone] Non-pressure  chronic ulcer of other part of left foot with fat layer exposed E11.621 Type 2  diabetes mellitus with foot ulcer Z89.422 Acquired absence of other left toe(s) Facility Procedures : CPT4 Code: 16109604 Description: 11042 - DEB SUBQ TISSUE 20 SQ CM/< ICD-10 Diagnosis Description L97.522 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.621 Type 2 diabetes mellitus with foot ulcer Modifier: Quantity: 1 Physician Procedures : CPT4 Code Description Modifier [id number] 11042 - WC PHYS SUBQ TISS 20 SQ CM ICD-10 Diagnosis Description L97.522 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.621 Type 2 diabetes mellitus with foot ulcer Quantity: 1 Electronic Signature(s) Signed: 08/25/2023 1:35:31 PM By: Geralyn Corwin DO Entered By: Geralyn Corwin on 08/21/2023 07:14:56

## 2023-08-26 ENCOUNTER — Other Ambulatory Visit: Payer: Self-pay

## 2023-08-26 ENCOUNTER — Encounter (HOSPITAL_BASED_OUTPATIENT_CLINIC_OR_DEPARTMENT_OTHER): Payer: 59 | Admitting: Internal Medicine

## 2023-08-26 DIAGNOSIS — E785 Hyperlipidemia, unspecified: Secondary | ICD-10-CM | POA: Diagnosis not present

## 2023-08-26 DIAGNOSIS — Z794 Long term (current) use of insulin: Secondary | ICD-10-CM | POA: Diagnosis not present

## 2023-08-26 DIAGNOSIS — L97522 Non-pressure chronic ulcer of other part of left foot with fat layer exposed: Secondary | ICD-10-CM | POA: Diagnosis not present

## 2023-08-26 DIAGNOSIS — I2510 Atherosclerotic heart disease of native coronary artery without angina pectoris: Secondary | ICD-10-CM | POA: Diagnosis not present

## 2023-08-26 DIAGNOSIS — E11621 Type 2 diabetes mellitus with foot ulcer: Secondary | ICD-10-CM

## 2023-08-26 DIAGNOSIS — Z89422 Acquired absence of other left toe(s): Secondary | ICD-10-CM | POA: Diagnosis not present

## 2023-08-26 DIAGNOSIS — Z955 Presence of coronary angioplasty implant and graft: Secondary | ICD-10-CM | POA: Diagnosis not present

## 2023-08-26 DIAGNOSIS — N189 Chronic kidney disease, unspecified: Secondary | ICD-10-CM | POA: Diagnosis not present

## 2023-08-26 DIAGNOSIS — I130 Hypertensive heart and chronic kidney disease with heart failure and stage 1 through stage 4 chronic kidney disease, or unspecified chronic kidney disease: Secondary | ICD-10-CM | POA: Diagnosis not present

## 2023-08-26 DIAGNOSIS — E1122 Type 2 diabetes mellitus with diabetic chronic kidney disease: Secondary | ICD-10-CM | POA: Diagnosis not present

## 2023-08-26 DIAGNOSIS — I252 Old myocardial infarction: Secondary | ICD-10-CM | POA: Diagnosis not present

## 2023-08-26 DIAGNOSIS — I5042 Chronic combined systolic (congestive) and diastolic (congestive) heart failure: Secondary | ICD-10-CM | POA: Diagnosis not present

## 2023-08-26 NOTE — Progress Notes (Signed)
ZIA, GUARISCO ([id number]) [id number] Page 1 of 12 Visit Report for 08/26/2023 Chief Complaint Document Details Patient Name: Date of Service: [name], [name] RO LD 08/26/2023 11:00 A M Medical Record Number: [id number] Patient Account Number: [id number] Date of Birth/Sex: Treating RN: March 17, 1971 (52 y.o. M) Primary Care Provider: [name] Other Clinician: Referring Provider: Treating Provider/Extender: [name] in Treatment: 30 Information Obtained from: Patient Chief Complaint 01/15/2023; left foot wound Electronic Signature(s) Signed: 08/26/2023 12:58:11 PM By: Geralyn Corwin DO Entered By: Geralyn Corwin on 08/26/2023 12:25:14 -------------------------------------------------------------------------------- Debridement Details Patient Name: Date of Service: Glenn Don[place], [name] 08/26/2023 11:00 A M Medical Record Number: [id number] Patient Account Number: [id number] Date of Birth/Sex: Treating RN: Jul 21, 1971 (52 y.o. Yates Decamp Primary Care Provider: [name] Other Clinician: Referring Provider: Treating Provider/Extender: [name] in Treatment: 30 Debridement Performed for Assessment: Wound #3 Left,Plantar Foot Performed By: Physician Geralyn Corwin, DO Debridement Type: Debridement Severity of Tissue Pre Debridement: Fat layer exposed Level of Consciousness (Pre-procedure): Awake and Alert Pre-procedure Verification/Time Out Yes - 11:30 Taken: Start Time: 11:31 Pain Control: Lidocaine 4% T opical Solution Percent of Wound Bed Debrided: 100% T Area Debrided (cm): otal 0.41 Tissue and other material debrided: Viable, Non-Viable, Callus, Slough, Subcutaneous, Slough Level: Skin/Subcutaneous Tissue Debridement Description: Excisional Instrument: Curette Bleeding: Minimum Hemostasis Achieved: Pressure Response to Treatment: Procedure was tolerated well Level  of Consciousness (Post- Awake and Alert procedure): Post Debridement Measurements of Total Wound Length: (cm) 1.3 Width: (cm) 0.4 Depth: (cm) 0.1 Volume: (cm) 0.041 Character of Wound/Ulcer Post Debridement: Improved Severity of Tissue Post Debridement: Fat layer exposed Post Procedure Diagnosis [name], [name] ([id number]) [id number] Page 2 of 12 Same as Pre-procedure Notes Scribed for Dr Mikey Bussing by Brenton Grills RN. Electronic Signature(s) Signed: 08/26/2023 12:58:11 PM By: Geralyn Corwin DO Signed: 08/26/2023 2:02:59 PM By: Brenton Grills Entered By: Brenton Grills on 08/26/2023 11:32:34 -------------------------------------------------------------------------------- HPI Details Patient Name: Date of Service: Glenn Donath [place], [name] 08/26/2023 11:00 A M Medical Record Number: [id number] Patient Account Number: [id number] Date of Birth/Sex: Treating RN: [birth year] (52 y.o. M) Primary Care Provider: [name] Other Clinician: Referring Provider: Treating Provider/Extender: [name] in Treatment: 30 History of Present Illness HPI Description: ADMISSION 01/11/2020 This is a 52 year old man with uncontrolled type 2 diabetes with a recent hemoglobin A1c earlier this year of 13.4. He is on insulin and glipizide. He does not take his blood sugars at home. He does have a follow-up with primary care later this month I believe on January 27. He tells [place] that roughly a month ago he was walking with a shoe with a hole in his foot. He took the shoe off and there was an open wound at roughly the left fourth met head. This has significant undermining and raised edges. He has not noticed any purulence he does not feel unwell. More recently he was taking skin off the bottom of his foot and has a superficial area on the left fifth met head. He has not been offloading this. The patient was in the ER on 12/20. They gave him Bactroban which  she has been using on the wound and 10 days worth of doxycycline. No x-rays were done. He has not had vascular studies. He is also been using hydrogen peroxide. Past medical history type 2 diabetes uncontrolled, chronic systolic heart failure, coronary artery disease with a history of congestive heart failure with stents. Hypertension hyperlipidemia and chronic renal  insufficiency ABI in this clinic was 1.14 on the left. 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 He is uncertain whether he would be able to work if we put him on some form of restriction 1/19; he is generally doing quite well. Using silver alginate on the wounds. Things actually look better. He has a forefoot offloading boot which she seems to be compliant about. He has support at work to stay off his foot is much as possible which is gratifying. Culture I did last week showed a few Enterococcus faecalis. I am going to put him on Augmentin. I talked about ordering an x-ray in my note last week but that does not seem to have happened. We will review reorder the x-ray this week. 1/26; x-ray reordered last week was negative for osteomyelitis. We are using silver alginate on the wound on the third and fifth met heads on the left. He is using a Darco forefoot off loader 2/2; the area on the fifth met head is closed. Third met head is still open with tunneling depth and thick callus. 2/9; the area on the fifth met head remains closed however the third met head again has a small open area on presentation with tunneling in depth and surrounded by thick callus. This looks like a pressure issue. We have been using silver alginate 2/16; the area of the fifth metatarsal head remains closed however the area over the third metatarsal head again is a small open area but with some depth. I do not think this is changed much since last week. He is using Hydrofera Blue with forefoot off loader. He is not able to use a total  contact cast on the left leg because he needs his left leg at work [employer]]. Fortunately the wound does not look infected. I changed him to endoform today 2/26; the area of the fifth metatarsal head remains closed. The area of the third metatarsal head has an even smaller opening this time. I used endoform on this this looks improved. He is offloading this is much as he can and a forefoot off loader on the left. He cannot have a total contact cast because of work responsibilities 3/5; the area on the fifth metatarsal head remains closed the area on the third metatarsal head is also closed on the left foot. 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 He has been following for podiatry for several months for this issue. He is currently using wet-to-dry saline dressings. He currently denies signs of infection. [place] note from 1/17; Patient presents with a 3-month history of nonhealing ulcer to the left foot secondary to diabetes and inability to offload well the area. He has had multiple debridements in the past to his feet bilaterally. He has had resection of the fourth left met head in the fifth toe Secondary to osteomyelitis. We discussed the importance of glycemic control for wound healing. Due to his blood glucose levels being elevated he is at high risk for infection and thus further amputation. He expressed understanding. He states he is supposed to be referred to an endocrinologist at [place] [facility] however the referral fell through. Unclear what  happened. Offered a referral to endocrinology at [facility]. Patient was agreeable.Furthermore  we discussed the importance of aggressive offloading for his wound healing. This will be the most difficult part of the treatment plan for the patient to do. We discussed a total contact cast however he has declined that at this time. 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 He is currently using a surgical shoe with a peg assist. It does not fit well so we will give him a new one today. I do not think this is enough offloading. If he is not able to offload this area he will likely end up with a BKA. He is well aware of this. For now I recommended Medihoney and Hydrofera Blue for dressing changes. He will follow-up in our [place] office Since this is closer for him. [name], [name] ([id number]) [id number] Page 3 of 12 1/26; patient presents for follow-up. Last clinic visit I had seen him in [place] and we transferred him to [place] since this is a closer location for him. Progress note above from that visit. Over the past week He has been using Medihoney and Hydrofera Blue to the wound bed. He has been using his surgical shoe with peg assist. He has no issues or complaints today. We discussed doing the total contact cast and he was agreeable to having this placed at next clinic visit. 2/2; patient presents for follow-up. He has been using Vashe wet-to-dry dressings. Plan is for the total contact cast today. 2/5; patient presents for follow-up. The cast was placed 3 days ago. He tolerated this well however had a lot of drainage. He currently denies signs of infection. 2/9; patient presents for follow-up. At last clinic visit we held off on replacing the cast due to drainage. He has been using Vashe wet-to-dry dressings and he is currently taking the oral antibiotics prescribed without issues. He declines a total contact cast today. We ran insurance verification for skin substitute and  due to cost patient declines having this placed. 2/16; patient presents for follow-up. He has decided not to follow-up with podiatry. He continues to decline the total contact cast. He has been using Vashe wet- to-dry dressings. He has not been wearing his surgical shoe with peg assist. He currently denies signs of infection. 3/1; patient presents for follow-up. He has been doing Vashe wet-to-dry dressings. He has a surgical shoe with peg assist. We discussed potentially doing a skin substitute for which she has been approved for by insurance. He knows the out-of-pocket cost of this and would still like to proceed with having this placed today. He denies the current total contact cast. 3/8; patient presents for follow-up. He states that the skin substitute came off after a few days and he has been using Vashe wet-to-dry dressings. He has mild odor to the wound bed on exam. 3/15; patient presents for follow-up. He has been using Vashe wet-to-dry dressings and started his course of Augmentin. He reports improvement in wound healing. He has no issues or complaints today. 3/21; patient presents for follow-up. He continues to use Vashe wet-to-dry dressings. He has no issues or complaints today. 3/28; patient presents for follow-up. He has been using Vashe wet-to-dry dressings. He has no issues or complaints today. He has been using his peg assist with surgical shoe. 4/11; patient presents for follow-up. He has been using Vashe wet-to-dry dressings. The periwound is macerated.  He has been using his peg assist with surgical shoe. He has no issues or complaints today. 4/25; patient presents for follow-up. Has been using Vashe wet-to-dry dressings. He has been using zinc oxide to the periwound. For offloading he has been using his peg assist in surgical shoe. He has no issues or complaints today. 5/9; patient presents for follow-up. He has been using Vashe wet-to-dry dressings. He has no issues or  complaints today. He has a surgical shoe with peg assist for offloading. 5/23; patient presents for follow up. He has been using Vashe to clean the wound bed and collagen and blast X with dressing changes. [place] the [place] 2 [place]. Wound is bigger. 5/31; patient presents for follow-up. He has been using Vashe wet-to-dry dressings. Wound is stable but with healthier granulation tissue. Patient would like to hold off on the total contact cast and try a knee scooter for offloading. 6/13; patient presents for follow-up. He has been using Vashe wet-to-dry dressings. He has been using a knee scooter for offloading however states he felt unstable on this and decided to buy an electric scooter. This arrived in the mail today. He would like to hold off on the left total contact cast. Wound is slightly smaller today. 6/27; patient presents for follow-up. He has been using Vashe wet-to-dry dressings. He is using an Art gallery manager for mobility to help relieve the pressure off the wound bed. Wound is smaller today. He wants to hold off on doing the total contact cast as he is showing signs of improvement with wound healing. 7/11; patient presents for follow-up. He has been using Vashe wet-to-dry dressings and his electric scooter for offloading. Wound is slightly smaller. 7/25; patient presents for follow up. He has been using Vashe wet-to-dry dressings and he reports using his electric scooter for offloading. Wound is larger. 7/30; patient presents for follow-up. He has been using Vashe wet-to-dry dressings. He has taken Augmentin as prescribed without issues. Overall wound has healthier granulation tissue. 8/1; patient presents for follow-up. He presents for obligatory cast change. He tolerated the cast well and has no issues or complaints today. We have been using antibiotic ointment with Hydrofera Blue under the cast. Wound is slightly smaller. 8/8; patient presents for follow-up. We have been  using antibiotic ointment with Hydrofera Blue under the total contact cast. Wound is smaller. He has no issues or complaints today. 8/13; patient presents for follow-up. We have been using antibiotic ointment with Hydrofera Blue under the total contact cast. He comes in today because the top part of the cast over the foot has sunken as well as on the side. He wanted to assure that there were no issues. 8/22; patient presents for follow-up. He has been using Hydrofera Blue to the wound bed. Previously we have been using a total contact cast. We do not have total contact casts due to manufacturing issues available for patients. He has no issues or complaints. He denies signs of infection. 8/27; patient presents for follow-up. He has been using Vashe wet-to-dry dressings. T contact cast is available for placement today. He was agreeable to otal proceed with this. He denies signs of infection. Electronic Signature(s) Signed: 08/26/2023 12:58:11 PM By: Geralyn Corwin DO Entered By: Geralyn Corwin on 08/26/2023 12:25:44 [name] ([id number]) [id number] Page 4 of 12 -------------------------------------------------------------------------------- Physical Exam Details Patient Name: Date of Service: Glenn Donath [place], [name] RO LD 08/26/2023 11:00 A M Medical Record Number: [id number] Patient Account Number: [id number] Date of Birth/Sex:  Treating RN: Jan 29, 1971 (52 y.o. M) Primary Care Provider: [name] Other Clinician: Referring Provider: Treating Provider/Extender: [name] in Treatment: 30 Constitutional respirations regular, non-labored and within target range for patient.. Cardiovascular 2+ dorsalis pedis/posterior tibialis pulses. Psychiatric pleasant and cooperative. Notes Left foot: T the lateral aspect fifth met head there is an open wound with granulation tissue, slough and callus. No erythema or increased warmth to  the o periwound. Electronic Signature(s) Signed: 08/26/2023 12:58:11 PM By: Geralyn Corwin DO Entered By: Geralyn Corwin on 08/26/2023 12:26:19 -------------------------------------------------------------------------------- Physician Orders Details Patient Name: Date of Service: Glenn Don[place], [name] 08/26/2023 11:00 A M Medical Record Number: [id number] Patient Account Number: [id number] Date of Birth/Sex: Treating RN: [birth year] (52 y.o. Yates Decamp Primary Care Provider: [name] Other Clinician: Referring Provider: Treating Provider/Extender: [name] in Treatment: 30 Verbal / Phone Orders: No Diagnosis Coding Follow-up Appointments ppointment in 1 week. - Dr Mikey Bussing Room 8 0845 room 9 08/28/2023 Return A ppointment in 2 weeks. - Dr. Mikey Bussing room 8 09/04/2023 (please schedule for patient) Return A Return appointment in 3 weeks. - Dr Mikey Bussing Room 8 (please schedule for patient) Return appointment in 1 month. - Dr .Mikey Bussing Room 8 (please schedule for patient) Anesthetic (In clinic) Topical Lidocaine 4% applied to wound bed Cellular or Tissue Based Products Cellular or Tissue Based Product Type: - apligraf and grafix 50% coinsurance 02/28/23-apply 1st Grafix 03/07/2023 HOLD THIS WEEK. Bathing/ Shower/ Hygiene May shower with protection but do not get wound dressing(s) wet. Protect dressing(s) with water repellant cover (for example, large plastic bag) or a cast cover and may then take shower. Off-Loading Total Contact Cast to Left Lower Extremity - size 3 heel cup to toes. foam pad to anterior ankle- applied 08/26/2023 Open toe surgical shoe to: - with Peg assist. add felt to shoe to aid in offloading. use knee scooter when you need to. HOLD 08/26/23 TCC #3 applied Wound Treatment Wound #3 - Foot Wound Laterality: Plantar, Left Cleanser: Soap and Water 1 x Per Day/30 Days Discharge Instructions: May shower and wash wound with dial  antibacterial soap and water prior to dressing change. LAMONDRE, HARVIN ([id number]) [id number] Page 5 of 12 Cleanser: Vashe 5.8 (oz) 1 x Per Day/30 Days Discharge Instructions: Cleanse the wound with Vashe prior to applying a clean dressing using gauze sponges, not tissue or cotton balls. Topical: Gentamicin 1 x Per Day/30 Days Discharge Instructions: As directed by physician Topical: Mupirocin Ointment 1 x Per Day/30 Days Discharge Instructions: Apply Mupirocin (Bactroban) as instructed Prim Dressing: Hydrofera Blue Ready Transfer Foam, 2.5x2.5 (in/in) 1 x Per Day/30 Days ary Discharge Instructions: Apply directly to wound bed as directed Prim Dressing: Vashe wet to dry 1 x Per Day/30 Days ary Discharge Instructions: vashe moisten gauze to wound bed. Prim Dressing: 2x2 gauze 1 x Per Day/30 Days ary Discharge Instructions: moisten with vashe. Secondary Dressing: ABD Pad, 5x9 1 x Per Day/30 Days Secured With: Kerlix Roll Sterile, 4.5x3.1 (in/yd) 1 x Per Day/30 Days Discharge Instructions: Secure with Kerlix as directed. Secured With: 28M Medipore H Soft Cloth Surgical T ape, 4 x 10 (in/yd) 1 x Per Day/30 Days Discharge Instructions: Secure with tape as directed. Electronic Signature(s) Signed: 08/26/2023 12:58:11 PM By: Geralyn Corwin DO Entered By: Geralyn Corwin on 08/26/2023 12:26:27 -------------------------------------------------------------------------------- Problem List Details Patient Name: Date of Service: Glenn Donath [place], [name] 08/26/2023 11:00 A M Medical Record Number: [id number] Patient Account Number: [id number] Date of  Birth/Sex: Treating RN: 05-14-1971 (52 y.o. M) Primary Care Provider: [name] Other Clinician: Referring Provider: Treating Provider/Extender: [name] in Treatment: 30 Active Problems ICD-10 Encounter Code Description Active Date MDM Diagnosis [phone] Non-pressure chronic ulcer  of other part of left foot with fat layer exposed 01/24/2023 No Yes E11.621 Type 2 diabetes mellitus with foot ulcer 01/24/2023 No Yes Z89.422 Acquired absence of other left toe(s) 01/24/2023 No Yes Inactive Problems Resolved Problems Electronic Signature(s) Signed: 08/26/2023 12:58:11 PM By: Geralyn Corwin DO Entered By: Geralyn Corwin on 08/26/2023 12:24:54 [name] ([id number]) [id number] Page 6 of 12 -------------------------------------------------------------------------------- Progress Note Details Patient Name: Date of Service: Glenn Donath [place], [name] RO LD 08/26/2023 11:00 A M Medical Record Number: [id number] Patient Account Number: [id number] Date of Birth/Sex: Treating RN: 06-12-1971 (52 y.o. M) Primary Care Provider: [name] Other Clinician: Referring Provider: Treating Provider/Extender: [name] in Treatment: 30 Subjective Chief Complaint Information obtained from Patient 01/15/2023; left foot wound History of Present Illness (HPI) ADMISSION 01/11/2020 This is a 52 year old man with uncontrolled type 2 diabetes with a recent hemoglobin A1c earlier this year of 13.4. He is on insulin and glipizide. He does not take his blood sugars at home. He does have a follow-up with primary care later this month I believe on January 27. He tells [place] that roughly a month ago he was walking with a shoe with a hole in his foot. He took the shoe off and there was an open wound at roughly the left fourth met head. This has significant undermining and raised edges. He has not noticed any purulence he does not feel unwell. More recently he was taking skin off the bottom of his foot and has a superficial area on the left fifth met head. He has not been offloading this. The patient was in the ER on 12/20. They gave him Bactroban which she has been using on the wound and 10 days worth of doxycycline. No x-rays were done. He has not  had vascular studies. He is also been using hydrogen peroxide. Past medical history type 2 diabetes uncontrolled, chronic systolic heart failure, coronary artery disease with a history of congestive heart failure with stents. Hypertension hyperlipidemia and chronic renal insufficiency ABI in this clinic was 1.14 on the left. 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 He is uncertain whether he would be able to work if we put him on some form of restriction 1/19; he is generally doing quite well. Using silver alginate on the wounds. Things actually look better. He has a forefoot offloading boot which she seems to be compliant about. He has support at work to stay off his foot is much as possible which is gratifying. Culture I did last week showed a few Enterococcus faecalis. I am going to put him on Augmentin. I talked about ordering an x-ray in my note last week but that does not seem to have happened. We will review reorder the x-ray this week. 1/26; x-ray reordered last week was negative for osteomyelitis. We are using silver alginate on the wound on the third and fifth met heads on the left. He is using a Darco forefoot off loader 2/2; the area on the fifth met head is closed. Third met head is still open with tunneling depth and thick callus. 2/9; the area on the fifth met head remains closed however the third met head again has a small open area on presentation  with tunneling in depth and surrounded by thick callus. This looks like a pressure issue. We have been using silver alginate 2/16; the area of the fifth metatarsal head remains closed however the area over the third metatarsal head again is a small open area but with some depth. I do not think this is changed much since last week. He is using Hydrofera Blue with forefoot off loader. He is not able to use a total contact cast on the left leg because he needs his left leg at work [employer]]. Fortunately  the wound does not look infected. I changed him to endoform today 2/26; the area of the fifth metatarsal head remains closed. The area of the third metatarsal head has an even smaller opening this time. I used endoform on this this looks improved. He is offloading this is much as he can and a forefoot off loader on the left. He cannot have a total contact cast because of work responsibilities 3/5; the area on the fifth metatarsal head remains closed the area on the third metatarsal head is also closed on the left foot. 01/15/2023 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 He has been following for podiatry for several months for this issue. He is currently using wet-to-dry saline dressings. He currently denies signs of infection. [place]gresse note from 1/17; Patient presents with a 31-month history of nonhealing ulcer to the left foot secondary to diabetes and inability to offload well the area. He has had multiple debridements in the past to his feet bilaterally. He has had resection of the fourth left met head in the fifth toe Secondary to osteomyelitis. We discussed the importance of glycemic control for wound healing. Due to his blood glucose levels being elevated he is at high risk for infection and thus further amputation. He expressed understanding. He states he is supposed to be referred to an endocrinologist at [facility] however the referral fell through. Unclear what happened. Offered a referral to endocrinology at [facility]. Patient was agreeable.Furthermore we discussed the importance of aggressive offloading for his wound healing. This will be the most  difficult part of the treatment plan for the patient to do. We discussed a total contact cast however he has declined that at this time. 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 He is currently using a surgical shoe with a peg assist. It does not fit well so we will give him a new one today. I do not think this is enough offloading. If he is not able to offload this area he will likely end up with a BKA. He is well aware of this. For now I recommended Medihoney and Hydrofera Blue for dressing changes. He will follow-up in our [place] office Since this is closer for him. 1/26; patient presents for follow-up. Last clinic visit I had seen him in [place] and we transferred him to [place] since this is a closer location for him. Progress note above from that visit. Over the past week He has been using Medihoney and Hydrofera Blue to the wound bed. He has been using his  surgical shoe with peg assist. He has no issues or complaints today. We discussed doing the total contact cast and he was agreeable to having this placed at next clinic visit. 2/2; patient presents for follow-up. He has been using Vashe wet-to-dry dressings. Plan is for the total contact cast today. 2/5; patient presents for follow-up. The cast was placed 3 days ago. He tolerated this well however had a lot of drainage. He currently denies signs of [name], [name] ([id number]) [id number] Page 7 of 12 infection. 2/9; patient presents for follow-up. At last clinic visit we held off on replacing the cast due to drainage. He has been using Vashe wet-to-dry dressings and he is currently taking the oral antibiotics prescribed without issues. He declines a total contact cast today. We ran insurance verification for skin substitute and due to cost patient declines having this placed. 2/16; patient presents for follow-up. He has decided  not to follow-up with podiatry. He continues to decline the total contact cast. He has been using Vashe wet- to-dry dressings. He has not been wearing his surgical shoe with peg assist. He currently denies signs of infection. 3/1; patient presents for follow-up. He has been doing Vashe wet-to-dry dressings. He has a surgical shoe with peg assist. We discussed potentially doing a skin substitute for which she has been approved for by insurance. He knows the out-of-pocket cost of this and would still like to proceed with having this placed today. He denies the current total contact cast. 3/8; patient presents for follow-up. He states that the skin substitute came off after a few days and he has been using Vashe wet-to-dry dressings. He has mild odor to the wound bed on exam. 3/15; patient presents for follow-up. He has been using Vashe wet-to-dry dressings and started his course of Augmentin. He reports improvement in wound healing. He has no issues or complaints today. 3/21; patient presents for follow-up. He continues to use Vashe wet-to-dry dressings. He has no issues or complaints today. 3/28; patient presents for follow-up. He has been using Vashe wet-to-dry dressings. He has no issues or complaints today. He has been using his peg assist with surgical shoe. 4/11; patient presents for follow-up. He has been using Vashe wet-to-dry dressings. The periwound is macerated. He has been using his peg assist with surgical shoe. He has no issues or complaints today. 4/25; patient presents for follow-up. Has been using Vashe wet-to-dry dressings. He has been using zinc oxide to the periwound. For offloading he has been using his peg assist in surgical shoe. He has no issues or complaints today. 5/9; patient presents for follow-up. He has been using Vashe wet-to-dry dressings. He has no issues or complaints today. He has a surgical shoe with peg assist for offloading. 5/23; patient presents for follow up.  He has been using Vashe to clean the wound bed and collagen and blast X with dressing changes. [place] the [place] 2 [place]. Wound is bigger. 5/31; patient presents for follow-up. He has been using Vashe wet-to-dry dressings. Wound is stable but with healthier granulation tissue. Patient would like to hold off on the total contact cast and try a knee scooter for offloading. 6/13; patient presents for follow-up. He has been using Vashe wet-to-dry dressings. He has been using a knee scooter for offloading however states he felt unstable on this and decided to buy an electric scooter. This arrived in the mail today. He would like to hold off on the left total contact cast. Wound is slightly  smaller today. 6/27; patient presents for follow-up. He has been using Vashe wet-to-dry dressings. He is using an Art gallery manager for mobility to help relieve the pressure off the wound bed. Wound is smaller today. He wants to hold off on doing the total contact cast as he is showing signs of improvement with wound healing. 7/11; patient presents for follow-up. He has been using Vashe wet-to-dry dressings and his electric scooter for offloading. Wound is slightly smaller. 7/25; patient presents for follow up. He has been using Vashe wet-to-dry dressings and he reports using his electric scooter for offloading. Wound is larger. 7/30; patient presents for follow-up. He has been using Vashe wet-to-dry dressings. He has taken Augmentin as prescribed without issues. Overall wound has healthier granulation tissue. 8/1; patient presents for follow-up. He presents for obligatory cast change. He tolerated the cast well and has no issues or complaints today. We have been using antibiotic ointment with Hydrofera Blue under the cast. Wound is slightly smaller. 8/8; patient presents for follow-up. We have been using antibiotic ointment with Hydrofera Blue under the total contact cast. Wound is smaller. He has no issues or  complaints today. 8/13; patient presents for follow-up. We have been using antibiotic ointment with Hydrofera Blue under the total contact cast. He comes in today because the top part of the cast over the foot has sunken as well as on the side. He wanted to assure that there were no issues. 8/22; patient presents for follow-up. He has been using Hydrofera Blue to the wound bed. Previously we have been using a total contact cast. We do not have total contact casts due to manufacturing issues available for patients. He has no issues or complaints. He denies signs of infection. 8/27; patient presents for follow-up. He has been using Vashe wet-to-dry dressings. T contact cast is available for placement today. He was agreeable to otal proceed with this. He denies signs of infection. Patient History Information obtained from Patient. Family History Heart Disease - Mother,Father, No family history of Cancer, Diabetes, Hereditary Spherocytosis, Hypertension, Kidney Disease, Lung Disease, Seizures, Stroke, Thyroid Problems, Tuberculosis. Social History Never smoker, Marital Status - Single, Alcohol Use - Rarely, Drug Use - No History, Caffeine Use - Rarely. 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 58 [name], [name] ([id number]) [id number] Page 8 of 12 Denies history of Angina, Arrhythmia, Deep Vein Thrombosis, Hypotension, Peripheral  Arterial Disease, Peripheral Venous Disease, Phlebitis, Vasculitis Gastrointestinal Denies history of Cirrhosis , Colitis, Crohns, Hepatitis A, Hepatitis B, Hepatitis C Endocrine Patient has history of Type II Diabetes Genitourinary Denies history of End Stage Renal Disease Immunological Denies history of Lupus Erythematosus, Raynauds, Scleroderma Integumentary (Skin) Denies history of History of Burn Musculoskeletal Denies history of Gout, Rheumatoid Arthritis, Osteoarthritis, Osteomyelitis Neurologic Patient has history of Neuropathy Denies history of Dementia, Quadriplegia, Paraplegia, Seizure Disorder Oncologic Denies history of Received Chemotherapy, Received Radiation Psychiatric Denies history of Anorexia/bulimia, Confinement Anxiety Hospitalization/Surgery History - Heart Cath in 2018. Medical A Surgical History Notes nd Cardiovascular Ischemic Cardiomyopathy Genitourinary Renal Insufficiency Objective Constitutional respirations regular, non-labored and within target range for patient.. Vitals Time Taken: 11:07 AM, Height: 78 in, Weight: 275  lbs, BMI: 31.8, Temperature: 97.8 F, Pulse: 83 bpm, Respiratory Rate: 18 breaths/min, Blood Pressure: 104/73 mmHg. Cardiovascular 2+ dorsalis pedis/posterior tibialis pulses. Psychiatric pleasant and cooperative. General Notes: Left foot: T the lateral aspect fifth met head there is an open wound with granulation tissue, slough and callus. No erythema or increased o warmth to the periwound. Integumentary (Hair, Skin) Wound #3 status is Open. Original cause of wound was Gradually Appeared. The date acquired was: 10/01/2022. The wound has been in treatment 30 weeks. The wound is located on the Left,Plantar Foot. The wound measures 1.3cm length x 0.4cm width x 0.1cm depth; 0.408cm^2 area and 0.041cm^3 volume. There is Fat Layer (Subcutaneous Tissue) exposed. There is no tunneling or undermining noted. There is a medium amount of  serosanguineous drainage noted. The wound margin is thickened. There is large (67-100%) red, pink granulation within the wound bed. There is no necrotic tissue within the wound bed. The periwound skin appearance exhibited: Callus. The periwound skin appearance did not exhibit: Crepitus, Excoriation, Induration, Rash, Scarring, Dry/Scaly, Maceration, Atrophie Blanche, Cyanosis, Ecchymosis, Hemosiderin Staining, Mottled, Pallor, Rubor, Erythema. Periwound temperature was noted as No Abnormality. 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 I debrided nonviable tissue. I recommended Hydrofera Blue and antibiotic ointment under the total contact cast. Follow-up in 1 week. Procedures [name], [name] ([id number]) [id number] Page 9 of 12 Wound #3 Pre-procedure diagnosis of Wound #3 is a Diabetic Wound/Ulcer of the Lower Extremity located on the Left,Plantar Foot .Severity of Tissue Pre Debridement is: Fat layer exposed. There was a Excisional Skin/Subcutaneous Tissue Debridement with a total area of 0.41 sq cm performed by Geralyn Corwin, DO. With the following instrument(s): Curette to remove Viable and Non-Viable tissue/material. Material removed includes Callus, Subcutaneous Tissue, and Slough after achieving pain control using Lidocaine 4% Topical Solution. No specimens were taken. A time out was conducted at 11:30, prior to the start of the procedure. A Minimum amount of bleeding was controlled with Pressure. The procedure was tolerated well. Post Debridement Measurements: 1.3cm length x 0.4cm width x 0.1cm depth; 0.041cm^3 volume. Character of Wound/Ulcer Post Debridement is improved. Severity of Tissue Post Debridement is: Fat layer  exposed. Post procedure Diagnosis Wound #3: Same as Pre-Procedure General Notes: Scribed for Dr Mikey Bussing by Brenton Grills RN.. Pre-procedure diagnosis of Wound #3 is a Diabetic Wound/Ulcer of the Lower Extremity located on the Left,Plantar Foot . There was a T Research scientist (life sciences) otal Procedure by Geralyn Corwin, DO. Post procedure Diagnosis Wound #3: Same as Pre-Procedure Notes: Scribed for Dr Mikey Bussing by Brenton Grills RN.[name] Plan Follow-up Appointments: Return Appointment in 1 week. - Dr Mikey Bussing Room 8 0845 room 9 08/28/2023 Return Appointment in 2 weeks. - Dr. Mikey Bussing room 8 09/04/2023 (please schedule for patient) Return appointment in 3 weeks. - Dr Mikey Bussing Room 8 (please schedule for patient) Return appointment in 1 month. - Dr .Mikey Bussing Room 8 (please schedule for patient) Anesthetic: (In clinic) Topical Lidocaine 4% applied to wound bed Cellular or Tissue Based Products: Cellular or Tissue Based Product Type: - apligraf and grafix 50% coinsurance 02/28/23-apply 1st Grafix 03/07/2023 HOLD THIS WEEK. Bathing/ Shower/ Hygiene: May shower with protection but do not get wound dressing(s) wet. Protect dressing(s) with water repellant cover (for example, large plastic bag) or a cast cover and may then take  shower. Off-Loading: T Contact Cast to Left Lower Extremity - size 3 heel cup to toes. foam pad to anterior ankle- applied 08/26/2023 otal Open toe surgical shoe to: - with Peg assist. add felt to shoe to aid in offloading. use knee scooter when you need to. HOLD 08/26/23 TCC #3 applied WOUND #3: - Foot Wound Laterality: Plantar, Left Cleanser: Soap and Water 1 x Per Day/30 Days Discharge Instructions: May shower and wash wound with dial antibacterial soap and water prior to dressing change. Cleanser: Vashe 5.8 (oz) 1 x Per Day/30 Days Discharge Instructions: Cleanse the wound with Vashe prior to applying a clean dressing using gauze sponges, not tissue or cotton balls. Topical: Gentamicin 1 x Per  Day/30 Days Discharge Instructions: As directed by physician Topical: Mupirocin Ointment 1 x Per Day/30 Days Discharge Instructions: Apply Mupirocin (Bactroban) as instructed Prim Dressing: Hydrofera Blue Ready Transfer Foam, 2.5x2.5 (in/in) 1 x Per Day/30 Days ary Discharge Instructions: Apply directly to wound bed as directed Prim Dressing: Vashe wet to dry 1 x Per Day/30 Days ary Discharge Instructions: vashe moisten gauze to wound bed. Prim Dressing: 2x2 gauze 1 x Per Day/30 Days ary Discharge Instructions: moisten with vashe. Secondary Dressing: ABD Pad, 5x9 1 x Per Day/30 Days Secured With: Kerlix Roll Sterile, 4.5x3.1 (in/yd) 1 x Per Day/30 Days Discharge Instructions: Secure with Kerlix as directed. Secured With: 74M Medipore H Soft Cloth Surgical T ape, 4 x 10 (in/yd) 1 x Per Day/30 Days Discharge Instructions: Secure with tape as directed. 1. In office sharp debridement 2. T contact cast placed in standard fashion to the left lower extremity otal 3. Follow-up in 1 week Electronic Signature(s) Signed: 08/26/2023 12:58:11 PM By: Geralyn Corwin DO Entered By: Geralyn Corwin on 08/26/2023 12:27:49 -------------------------------------------------------------------------------- HxROS Details Patient Name: Date of Service: Glenn Donath [place], [name] 08/26/2023 11:00 A M Medical Record Number: [id number] Patient Account Number: [id number] Date of Birth/Sex: Treating RN: [birth year] (52 y.o. M) Primary Care Provider: [name] Other Clinician: Referring Provider: Treating Provider/Extender: [name], [name] ([id number]) [id number] Page 10 of 12 Weeks in Treatment: 30 Information Obtained From Patient Eyes Medical History: Negative for: Cataracts; Glaucoma; Optic Neuritis Ear/Nose/Mouth/Throat Medical History: Positive for: Chronic sinus problems/congestion Negative for: Middle ear  problems Hematologic/Lymphatic Medical History: Negative for: Anemia; Hemophilia; Human Immunodeficiency Virus; Lymphedema; Sickle Cell Disease Respiratory Medical History: Negative for: Aspiration; Asthma; Chronic Obstructive Pulmonary Disease (COPD); Pneumothorax; Sleep Apnea; Tuberculosis Cardiovascular Medical History: Positive for: Congestive Heart Failure; Coronary Artery Disease; Hypertension; Myocardial Infarction - age 72 Negative for: Angina; Arrhythmia; Deep Vein Thrombosis; Hypotension; Peripheral Arterial Disease; Peripheral Venous Disease; Phlebitis; Vasculitis Past Medical History Notes: Ischemic Cardiomyopathy Gastrointestinal Medical History: Negative for: Cirrhosis ; Colitis; Crohns; Hepatitis A; Hepatitis B; Hepatitis C Endocrine Medical History: Positive for: Type II Diabetes Time with diabetes: ten years Treated with: Insulin Blood sugar tested every day: No Genitourinary Medical History: Negative for: End Stage Renal Disease Past Medical History Notes: Renal Insufficiency Immunological Medical History: Negative for: Lupus Erythematosus; Raynauds; Scleroderma Integumentary (Skin) Medical History: Negative for: History of Burn Musculoskeletal Medical History: Negative for: Gout; Rheumatoid Arthritis; Osteoarthritis; Osteomyelitis Neurologic Medical History: Positive for: Neuropathy Negative for: Dementia; Quadriplegia; Paraplegia; Seizure Disorder Oncologic Medical History: Negative for: Received Chemotherapy; Received Radiation [name], [name] ([id number]) [id number] Page 11 of 12 Psychiatric Medical History: Negative for: Anorexia/bulimia; Confinement Anxiety HBO Extended History Items Ear/Nose/Mouth/Throat: Chronic sinus problems/congestion Immunizations Pneumococcal Vaccine: Received Pneumococcal Vaccination: No Implantable Devices None Hospitalization / Surgery History Type  of Hospitalization/Surgery Heart  Cath in 2018 Family and Social History Cancer: No; Diabetes: No; Heart Disease: Yes - Mother,Father; Hereditary Spherocytosis: No; Hypertension: No; Kidney Disease: No; Lung Disease: No; Seizures: No; Stroke: No; Thyroid Problems: No; Tuberculosis: No; Never smoker; Marital Status - Single; Alcohol Use: Rarely; Drug Use: No History; Caffeine Use: Rarely; Financial Concerns: No; Food, Clothing or Shelter Needs: No; Support System Lacking: No; Transportation Concerns: No Electronic Signature(s) Signed: 08/26/2023 12:58:11 PM By: Geralyn Corwin DO Entered By: Geralyn Corwin on 08/26/2023 12:25:51 -------------------------------------------------------------------------------- Total Contact Cast Details Patient Name: Date of Service: Glenn Donath [place], [name] RO LD 08/26/2023 11:00 A M Medical Record Number: [id number] Patient Account Number: [id number] Date of Birth/Sex: Treating RN: 12-16-1971 (52 y.o. Yates [place]amp Primary Care Provider: [name] Other Clinician: Referring Provider: Treating Provider/Extender: [name] in Treatment: 30 T Contact Cast Applied for Wound Assessment: otal Wound #3 Left,Plantar Foot Performed By: Physician Geralyn Corwin, DO Post Procedure Diagnosis Same as Pre-procedure Notes Scribed for Dr Mikey Bussing by Brenton Grills RN. Electronic Signature(s) Signed: 08/26/2023 12:58:11 PM By: Geralyn Corwin DO Signed: 08/26/2023 2:02:59 PM By: Brenton Grills Entered By: Brenton Grills on 08/26/2023 11:33:39 -------------------------------------------------------------------------------- SuperBill Details Patient Name: Date of Service: Glenn Donath [place], [name] 08/26/2023 Medical Record Number: [id number] Patient Account Number: [id number] JAISHAUN, HATHORNE ([id number]) [id number] Page 12 of 12 Date of Birth/Sex: Treating RN: [birth year] (52 y.o. Yates Decamp Primary Care Provider: [name] Other  Clinician: Referring Provider: Treating Provider/Extender: [name] in Treatment: 30 Diagnosis Coding ICD-10 Codes Code Description [phone] 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.621 Type 2 diabetes mellitus with foot ulcer Z89.422 Acquired absence of other left toe(s) Facility Procedures : CPT4 Code: [id number] Description: 11042 - DEB SUBQ TISSUE 20 SQ CM/< ICD-10 Diagnosis Description L97.522 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.621 Type 2 diabetes mellitus with foot ulcer Modifier: Quantity: 1 Physician Procedures : CPT4 Code Description Modifier [id number] 11042 - WC PHYS SUBQ TISS 20 SQ CM ICD-10 Diagnosis Description L97.522 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.621 Type 2 diabetes mellitus with foot ulcer Quantity: 1 Electronic Signature(s) Signed: 08/26/2023 12:58:11 PM By: Geralyn Corwin DO Entered By: Geralyn Corwin on 08/26/2023 12:27:58

## 2023-08-26 NOTE — Progress Notes (Signed)
ALISA, APPLEYARD ([id number]) [id number] Page 1 of 6 Visit Report for 08/26/2023 Arrival Information Details Patient Name: Date of Service: [name] [place], [name] RO LD 08/26/2023 11:00 A M Medical Record Number: [id number] Patient Account Number: [id number] Date of Birth/Sex: Treating RN: 21-Mar-1971 (52 y.o. M) Primary Care Naz Denunzio: [name] Other Clinician: Referring Brance Dartt: Treating Gracieann Stannard/Extender: [name] in Treatment: 30 Visit Information History Since Last Visit Added or deleted any medications: No Patient Arrived: Ambulatory Any new allergies or adverse reactions: No Arrival Time: 11:00 Had a fall or experienced change in No Accompanied By: self activities of daily living that may affect Transfer Assistance: None risk of falls: Patient Identification Verified: Yes Signs or symptoms of abuse/neglect since last visito No Secondary Verification Process Completed: Yes Hospitalized since last visit: No Patient Requires Transmission-Based Precautions: No Implantable device outside of the clinic excluding No Patient Has Alerts: Yes cellular tissue based products placed in the center Patient Alerts: 08/2021 ABI:1.2 TBI 0.79 since last visit: Has Dressing in Place as Prescribed: Yes Pain Present Now: No Electronic Signature(s) Signed: 08/26/2023 4:24:20 PM By: Thayer Dallas Entered By: Thayer Dallas on 08/26/2023 08:00:47 -------------------------------------------------------------------------------- Lower Extremity Assessment Details Patient Name: Date of Service: [name], [name] 08/26/2023 11:00 A M Medical Record Number: [id number] Patient Account Number: [id number] Date of Birth/Sex: Treating RN: 1971-03-08 (52 y.o. M) Primary Care Shanera Meske: [name] Other Clinician: Referring Brittony Billick: Treating Josearmando Kuhnert/Extender: [name] in Treatment: 30 Edema Assessment Assessed:  [name]: No] [name]: No] Edema: [Left: N] [Right: o] Calf Left: Right: Point of Measurement: 43 cm From Medial Instep 38.5 cm Ankle Left: Right: Point of Measurement: 9 cm From Medial Instep 23 cm Vascular Assessment Extremity colors, hair growth, and conditions: Extremity Color: [Left:Normal] Hair Growth on Extremity: [Left:Yes] Temperature of Extremity: [Left:Warm] CHUE, BUDNER ([id number]) [Right:[id number] Page 2 of 6] Capillary Refill: [Left:< 3 seconds] Dependent Rubor: [Left:No No] Electronic Signature(s) Signed: 08/26/2023 4:24:20 PM By: Thayer Dallas Entered By: Thayer Dallas on 08/26/2023 08:06:38 -------------------------------------------------------------------------------- Multi Wound Chart Details Patient Name: Date of Service: [name], [name] 08/26/2023 11:00 A M Medical Record Number: [id number] Patient Account Number: [id number] Date of Birth/Sex: Treating RN: [birth year] (52 y.o. 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 Chronic sinus problems/congestion, Comorbid History: Congestive Heart Failure, Coronary Artery Disease,  Hypertension, Myocardial Infarction, Type II Diabetes, Neuropathy 10/01/2022 Date Acquired: 30 Weeks of Treatment: Open Wound Status: No Wound Recurrence: Yes Pending [name] on Presentation: 1.3x0.4x0.1 Measurements L x W x D (cm) 0.408 A (cm) : rea  0.041 Volume (cm) : 89.60% % Reduction in A rea: 94.80% % Reduction in Volume: Grade 1 Classification: Medium Exudate  A mount: Serosanguineous Exudate Type: red, brown Exudate Color: Thickened Wound Margin: Large (67-100%) Granulation A mount: Red, Pink  Granulation Quality: None Present (0%) Necrotic A mount: Fat Layer (Subcutaneous Tissue): Yes N/A Exposed Structures: Fascia: No Tendon: No Muscle: No Joint: No Bone: No Medium (34-66%) Epithelialization: Debridement - Excisional Debridement:  Pre-procedure Verification/Time Out 11:30 Taken: Lidocaine 4% Topical Solution Pain Control: Callus, Subcutaneous, Slough Tissue Debrided: Skin/Subcutaneous Tissue Level: 0.41 Debridement A (sq cm): rea Curette Instrument: Minimum Bleeding:] [N/A:N/A  N/A  N/A N/A N/A N/A N/A N/A N/A N/A N/A N/A N/A N/A N/A N/A N/A N/A N/A N/A N/A N/A N/A N/A N/A N/A N/A N/A N/A N/A N/A N/A] HOCHSTETLER, Jachob ([id number]) [3:Pressure Hemostasis Achieved: Procedure was tolerated well Debridement Treatment Response: 1.3x0.4x0.1 Post Debridement Measurements L x W x D (cm) 0.041 Post Debridement Volume: (cm) Callus: Yes Periwound Skin Texture: Excoriation: No Induration: No  Crepitus: No Rash: No Scarring: No Maceration: No Periwound Skin Moisture: Dry/Scaly: No Atrophie Blanche: No Periwound Skin Color: Cyanosis: No Ecchymosis: No Erythema: No Hemosiderin Staining: No Mottled: No Pallor: No Rubor: No No Abnormality  Temperature: Debridement Procedures Performed: T Contact Cast otal] [N/A:N/A N/A N/A N/A N/A N/A N/A N/A N/A] Treatment Notes Electronic Signature(s) Signed: 08/26/2023 12:58:11 PM By: Geralyn Corwin DO Entered By: Geralyn Corwin on 08/26/2023 09:25:00 -------------------------------------------------------------------------------- Multi-Disciplinary Care Plan Details Patient Name: Date of Service: [name], [name] 08/26/2023 11:00 A M Medical Record Number: [id number] Patient Account Number: [id number] Date of Birth/Sex: Treating RN: [birth year] (52 y.o. Yates Decamp Primary Care Lilliam Chamblee: [name] Other  Clinician: Referring Jeraldin Fesler: Treating Jeriann Sayres/Extender: [name] in Treatment: 30 Active Inactive Wound/Skin Impairment Nursing Diagnoses: Impaired tissue integrity Knowledge deficit related to ulceration/compromised skin integrity Goals: Patient will have a decrease in wound volume by X% from date: (specify in notes) Date Initiated: 01/24/2023 Target Resolution Date: 08/30/2023 Goal Status: Active Patient/caregiver will verbalize understanding of skin care regimen Date Initiated: 01/24/2023 Target Resolution Date: 08/30/2023 Goal Status: Active Ulcer/skin breakdown will have a volume reduction of 30% by week 4 Date Initiated: 01/24/2023 Date Inactivated: 03/07/2023 Target Resolution Date: 02/27/2023 Goal Status: Unmet Unmet Reason: larger today. Ulcer/skin breakdown will have a volume reduction of 50% by week 8 Date Initiated: 01/24/2023 Target Resolution Date: 08/30/2023 Goal Status: Active Interventions: Assess patient/caregiver ability to obtain necessary supplies Assess patient/caregiver ability to perform ulcer/skin care regimen upon admission and as needed Assess ulceration(s) every visit Notes: BRONSYN, HARROWER ([id number]) [id number] Page 4 of 6 Electronic Signature(s) Signed: 08/26/2023 2:02:59 PM By: Brenton Grills Entered By: Brenton Grills on 08/26/2023 08:38:06 -------------------------------------------------------------------------------- Pain Assessment Details Patient Name: Date of Service: [name] [place], [name] 08/26/2023 11:00 A M Medical Record Number: [id number] Patient Account Number: [id number] Date of Birth/Sex: Treating RN: 11/25/1971 (52 y.o. M) Primary Care Lora Glomski: [name] Other Clinician: Referring Corinne Goucher: Treating Anniah Glick/Extender: [name] in Treatment: 30 Active Problems Location of Pain Severity and Description of Pain Patient Has Paino No Site  Locations Pain Management and Medication Current Pain Management: Electronic Signature(s) Signed: 08/26/2023 4:24:20 PM By: Thayer Dallas Entered By: Thayer Dallas on 08/26/2023 08:07:06 -------------------------------------------------------------------------------- Patient/Caregiver Education Details Patient Name: Date of Service: [name], [name] 8/27/2024andnbsp11:00 A M Medical Record Number: [id number] Patient Account Number: [id number] Date of Birth/Gender: Treating RN: 07/05/1971 (52 y.o. Yates Decamp Primary Care Physician: [name] Other Clinician: Referring Physician: Treating Physician/Extender: [name] in Treatment: 30 Education Assessment Education Provided To: [name] ([id number]) [id number] Page 5 of 6 Patient Education Topics Provided Wound/Skin Impairment: Methods: Explain/Verbal Responses: State content correctly Electronic Signature(s) Signed: 08/26/2023 2:02:59 PM By: Brenton Grills Entered By: Brenton Grills on 08/26/2023 08:41:15 -------------------------------------------------------------------------------- Wound Assessment Details Patient Name: Date of Service: [name], [name] 08/26/2023 11:00 A M Medical Record Number: [id number] Patient Account Number: [id number] Date of Birth/Sex: Treating RN: [birth year] (52 y.o. M) Primary Care Kendalynn Wideman: [name] Other Clinician: Referring Reef Achterberg:  Treating Annisten Manchester/Extender: [name] in Treatment: 30 Wound Status Wound Number: 3 Primary Diabetic Wound/Ulcer of the Lower Extremity Etiology: Wound Location: Left, Plantar Foot Wound Open Wounding Event: Gradually Appeared Status: Date Acquired: 10/01/2022 Comorbid Chronic sinus problems/congestion, Congestive Heart Failure, Weeks Of Treatment: 30 History: Coronary Artery Disease, Hypertension, Myocardial Infarction, Type Clustered  Wound: No II Diabetes, Neuropathy Pending Amputation On Presentation Wound under treatment by Jalin Alicea outside of Wound Center Wound Measurements Length: (cm) 1.3 Width: (cm) 0.4 Depth: (cm) 0.1 Area: (cm) 0.408 Volume: (cm) 0.041 % Reduction in Area: 89.6% % Reduction in Volume: 94.8% Epithelialization: Medium (34-66%) Tunneling: No Undermining: No Wound Description Classification: Grade 1 Wound Margin: Thickened Exudate Amount: Medium Exudate Type: Serosanguineous Exudate Color: red, brown Foul Odor After Cleansing: No Slough/Fibrino No Wound Bed Granulation Amount: Large (67-100%) Exposed Structure Granulation Quality: Red, Pink Fascia Exposed: No Necrotic Amount: None Present (0%) Fat Layer (Subcutaneous Tissue) Exposed: Yes Tendon Exposed: No Muscle Exposed: No Joint Exposed: No Bone Exposed: No Periwound Skin Texture Texture Color No Abnormalities Noted: No No Abnormalities Noted: No Callus: Yes Atrophie Blanche: No Crepitus: No Cyanosis: No Excoriation: No Ecchymosis: No Induration: No Erythema: No Rash: No Hemosiderin Staining: No Scarring: No Mottled: No Pallor: No Moisture Rubor: No No Abnormalities Noted: No JAQUINTON, GERY ([id number]) [id number] Page 6 of 6 Dry / Scaly: No Temperature / Pain Maceration: No Temperature: No Abnormality Electronic Signature(s) Signed: 08/26/2023 4:24:20 PM By: Thayer Dallas Entered By: Thayer Dallas on 08/26/2023 08:38:04 -------------------------------------------------------------------------------- Vitals Details Patient Name: Date of Service: [name], [name] 08/26/2023 11:00 A M Medical Record Number: [id number] Patient Account Number: [id number] Date of Birth/Sex: Treating RN: 10/16/1971 (52 y.o. M) Primary Care Suraj Ramdass: [name] Other Clinician: Referring Rekita Miotke: Treating Kross Swallows/Extender: [name] in Treatment: 30 Vital  Signs Time Taken: 11:07 Temperature (F): 97.8 Height (in): 78 Pulse (bpm): 83 Weight (lbs): 275 Respiratory Rate (breaths/min): 18 Body Mass Index (BMI): 31.8 Blood Pressure (mmHg): 104/73 Reference Range: 80 - 120 mg / dl Electronic Signature(s) Signed: 08/26/2023 4:24:20 PM By: Thayer Dallas Entered By: Thayer Dallas on 08/26/2023 08:08:03

## 2023-08-26 NOTE — Progress Notes (Signed)
KEYONE, SCHRAGE ([id number]) [id number] Page 1 of 7 Visit Report for 08/21/2023 Arrival Information Details Patient Name: Date of Service: [name] [place], [name] RO LD 08/21/2023 8:45 A M Medical Record Number: [id number] Patient Account Number: [id number] Date of Birth/Sex: Treating RN: [birth year] (52 y.o. [name] Primary Care Aleese Kamps: [name] Other Clinician: Referring Reiner Loewen: Treating Alonna Bartling/Extender: [name] in Treatment: 29 Visit Information History Since Last Visit All ordered tests and consults were completed: Yes Patient Arrived: Ambulatory Added or deleted any medications: No Arrival Time: 09:06 Any new allergies or adverse reactions: No Accompanied By: self Had a fall or experienced change in No Transfer Assistance: None activities of daily living that may affect Patient Identification Verified: Yes risk of falls: Secondary Verification Process Completed: Yes Signs or symptoms of abuse/neglect since last visito No Patient Requires Transmission-Based Precautions: No Hospitalized since last visit: No Patient Has Alerts: Yes Implantable device outside of the clinic excluding No Patient Alerts: 08/2021 ABI:1.2 TBI 0.79 cellular tissue based products placed in the center since last visit: Has Dressing in Place as Prescribed: Yes Pain Present Now: No Electronic Signature(s) Signed: 08/21/2023 4:35:35 PM By: Karie Schwalbe RN Entered By: Karie Schwalbe on 08/21/2023 06:06:46 -------------------------------------------------------------------------------- Encounter Discharge Information Details Patient Name: Date of Service: [name], [name] 08/21/2023 8:45 A M Medical Record Number: [id number] Patient Account Number: [id number] Date of Birth/Sex: Treating RN: Dec 02, 1971 (52 y.o. [name] Primary Care Anaid Haney: [name] Other Clinician: Referring Mackayla Mullins: Treating  Renlee Floor/Extender: [name] in Treatment: 29 Encounter Discharge Information Items Post Procedure Vitals Discharge Condition: Stable Temperature (F): 97.8 Ambulatory Status: Ambulatory Pulse (bpm): 80 Discharge Destination: Home Respiratory Rate (breaths/min): 18 Transportation: Private Auto Blood Pressure (mmHg): 112/79 Accompanied By: self Schedule Follow-up Appointment: Yes Clinical Summary of Care: Electronic Signature(s) Signed: 08/21/2023 5:48:32 PM By: Shawn Stall RN, BSN Entered By: Shawn Stall on 08/21/2023 06:37:06 [name] ([id number]) [id number] Page 2 of 7 -------------------------------------------------------------------------------- Lower Extremity Assessment Details Patient Name: Date of Service: [name] [place], [name] RO LD 08/21/2023 8:45 A M Medical Record Number: [id number] Patient Account Number: [id number] Date of Birth/Sex: Treating RN: [birth year] (52 y.o. [name] Primary Care Lyncoln Ledgerwood: [name] Other Clinician: Referring Aalyssa Elderkin: Treating Ernest Orr/Extender: [name] in Treatment: 29 Edema Assessment Assessed: [name]: No] [name]: No] Edema: [Left: N] [Right: o] Calf Left: Right: Point of Measurement: 43 cm From Medial Instep 38 cm Ankle Left: Right: Point of Measurement: 9 cm From Medial Instep 22 cm Vascular Assessment Pulses: Dorsalis Pedis Palpable: [Left:Yes] Extremity colors, hair growth, and conditions: Extremity Color: [Left:Normal] Hair Growth on Extremity: [Left:Yes] Temperature of Extremity: [Left:Warm] Capillary Refill: [Left:< 3 seconds] Dependent Rubor: [Left:No No] Toe Nail Assessment Left: Right: Thick: No Discolored: No Deformed: No Improper Length and Hygiene: No Electronic Signature(s) Signed: 08/21/2023 4:35:35 PM By: Karie Schwalbe RN Entered By: Karie Schwalbe on 08/21/2023  06:10:23 -------------------------------------------------------------------------------- Multi Wound Chart Details Patient Name: Date of Service: [name], [name] 08/21/2023 8:45 A M Medical Record Number: [id number] Patient Account Number: [id number] Date of Birth/Sex: Treating RN: Sep 29, 1971 (52 y.o. M) Primary Care Yu Peggs: [name] Other Clinician: Referring Charday Capetillo: Treating Kreed Kauffman/Extender: [name] in Treatment: 29 Vital Signs Height(in): 78 Pulse(bpm): 80 Weight(lbs): 275 Blood Pressure(mmHg): 112/79 Body Mass Index(BMI): 31.8 Latta, [name] ([id number]) [id number] Page 3 of 7 Temperature(F): 97.8 Respiratory Rate(breaths/min): 18 [3:Photos:] [N/A:N/A] Left, Plantar Foot N/A N/A Wound Location: Gradually Appeared N/A N/A  Wounding Event: Diabetic Wound/Ulcer of the Lower N/A N/A Primary Etiology: Extremity Chronic sinus problems/congestion, N/A N/A Comorbid History: Congestive Heart Failure, Coronary Artery Disease, Hypertension, Myocardial Infarction, Type II Diabetes, Neuropathy 10/01/2022 N/A N/A Date Acquired: 35 N/A N/A Weeks of Treatment: Open N/A N/A Wound Status: No N/A N/A Wound Recurrence: Yes N/A N/A Pending [name] on Presentation: 1.7x0.7x0.1 N/A N/A Measurements L x W x D (cm) 0.935 N/A N/A A (cm) : rea 0.093 N/A N/A Volume (cm) : 76.20% N/A N/A % Reduction in A rea: 88.20% N/A N/A % Reduction in Volume: Grade 1 N/A N/A Classification: Medium N/A N/A Exudate A mount: Serosanguineous N/A N/A Exudate Type: red, brown N/A N/A Exudate Color: Thickened N/A N/A Wound Margin: Large (67-100%) N/A N/A Granulation A mount: Red, Pink, Hyper-granulation N/A N/A Granulation Quality: None Present (0%) N/A N/A Necrotic A mount: Fat Layer (Subcutaneous Tissue): Yes N/A N/A Exposed Structures: Fascia: No Tendon: No Muscle: No Joint: No Bone: No Medium  (34-66%) N/A N/A Epithelialization: Debridement - Excisional N/A N/A Debridement: Pre-procedure Verification/Time Out 09:25 N/A N/A Taken: Lidocaine 4% Topical Solution N/A N/A Pain Control: Callus, Subcutaneous, Slough N/A N/A Tissue Debrided: Skin/Subcutaneous Tissue N/A N/A Level: 0.93 N/A N/A Debridement A (sq cm): rea Curette N/A N/A Instrument: Minimum N/A N/A Bleeding: Pressure N/A N/A Hemostasis A chieved: 0 N/A N/A Procedural Pain: 0 N/A N/A Post Procedural Pain: Procedure was tolerated well N/A N/A Debridement Treatment Response: 1.7x0.7x0.2 N/A N/A Post Debridement Measurements L x W x D (cm) 0.187 N/A N/A Post Debridement Volume: (cm) Callus: Yes N/A N/A Periwound Skin Texture: Excoriation: No Induration: No Crepitus: No Rash: No Scarring: No Maceration: No N/A N/A Periwound Skin Moisture: Dry/Scaly: No Atrophie Blanche: No N/A N/A Periwound Skin Color: Cyanosis: No Ecchymosis: No Erythema: No Hemosiderin Staining: No Mottled: No Pallor: No Rubor: No No Abnormality N/A N/A Temperature: Debridement N/A N/A Procedures Performed: WILBURT, FLEITES ([id number]) [id number] Page 4 of 7 Treatment Notes Wound #3 (Foot) Wound Laterality: Plantar, Left Cleanser Soap and Water Discharge Instruction: May shower and wash wound with dial antibacterial soap and water prior to dressing change. Vashe 5.8 (oz) Discharge Instruction: Cleanse the wound with Vashe prior to applying a clean dressing using gauze sponges, not tissue or cotton balls. Peri-Wound Care Topical Primary Dressing Vashe wet to dry Discharge Instruction: vashe moisten gauze to wound bed. 2x2 gauze Discharge Instruction: moisten with vashe. Secondary Dressing ABD Pad, 5x9 Secured With [employer], 4.5x3.1 (in/yd) Discharge Instruction: Secure with Kerlix as directed. 6M Medipore H Soft Cloth Surgical T ape, 4 x 10 (in/yd) Discharge Instruction:  Secure with tape as directed. Compression Wrap Compression Stockings Add-Ons Electronic Signature(s) Signed: 08/25/2023 1:35:31 PM By: Geralyn Corwin DO Entered By: Geralyn Corwin on 08/21/2023 07:05:05 -------------------------------------------------------------------------------- Multi-Disciplinary Care Plan Details Patient Name: Date of Service: [name], [name] 08/21/2023 8:45 A M Medical Record Number: [id number] Patient Account Number: [id number] Date of Birth/Sex: Treating RN: July 31, 1971 (53 y.o. [name] Primary Care Kenneth Cuaresma: [name] Other Clinician: Referring Kenon Delashmit: Treating Adlene Adduci/Extender: [name] in Treatment: 29 Active Inactive Wound/Skin Impairment Nursing Diagnoses: Impaired tissue integrity Knowledge deficit related to ulceration/compromised skin integrity Goals: Patient will have a decrease in wound volume by X% from date: (specify in notes) Date Initiated: 01/24/2023 Target Resolution Date: 08/30/2023 Goal Status: Active Patient/caregiver will verbalize understanding of skin care regimen Date Initiated: 01/24/2023 Target Resolution Date: 08/30/2023 Goal Status: Active Ulcer/skin breakdown will have a volume reduction of 30%  by week 4 TRAVONN, ALEXIS ([id number]) [id number] Page 5 of 7 Date Initiated: 01/24/2023 Date Inactivated: 03/07/2023 Target Resolution Date: 02/27/2023 Goal Status: Unmet Unmet Reason: larger today. Ulcer/skin breakdown will have a volume reduction of 50% by week 8 Date Initiated: 01/24/2023 Target Resolution Date: 08/30/2023 Goal Status: Active Interventions: Assess patient/caregiver ability to obtain necessary supplies Assess patient/caregiver ability to perform ulcer/skin care regimen upon admission and as needed Assess ulceration(s) every visit Notes: Electronic Signature(s) Signed: 08/21/2023 5:48:32 PM By: Shawn Stall RN, BSN Entered By: Shawn Stall on 08/21/2023 06:31:06 -------------------------------------------------------------------------------- Pain Assessment Details Patient Name: Date of Service: [name], [name] 08/21/2023 8:45 A M Medical Record Number: [id number] Patient Account Number: [id number] Date of Birth/Sex: Treating RN: Apr 05, 1971 (52 y.o. [name] Primary Care Romello Hoehn: [name] Other Clinician: Referring Avannah Decker: Treating Gilma Bessette/Extender: [name] in Treatment: 29 Active Problems Location of Pain Severity and Description of Pain Patient Has Paino No Site Locations Pain Management and Medication Current Pain Management: Electronic Signature(s) Signed: 08/21/2023 4:35:35 PM By: Karie Schwalbe RN Entered By: Karie Schwalbe on 08/21/2023 06:09:37 Patient/Caregiver Education Details -------------------------------------------------------------------------------- [name] ([id number]) [id number] Page 6 of 7 Patient Name: Date of Service: [name] [place], [name] RO LD 8/22/2024andnbsp8:45 A M Medical Record Number: [id number] Patient Account Number: [id number] Date of Birth/Gender: Treating RN: 1971/08/06 (52 y.o. [name] Primary Care Physician: [name] Other Clinician: Referring Physician: Treating Physician/Extender: [name] in Treatment: 29 Education Assessment Education Provided To: Patient Education Topics Provided Offloading: Handouts: How Offloading Helps Foot Wounds Heal Methods: Explain/Verbal Responses: Reinforcements needed Wound/Skin Impairment: Handouts: Caring for Your Ulcer Methods: Explain/Verbal Responses: Reinforcements needed Electronic Signature(s) Signed: 08/21/2023 5:48:32 PM By: Shawn Stall RN, BSN Entered By: Shawn Stall on 08/21/2023 06:31:53 -------------------------------------------------------------------------------- Wound Assessment  Details Patient Name: Date of Service: [name], [name] 08/21/2023 8:45 A M Medical Record Number: [id number] Patient Account Number: [id number] Date of Birth/Sex: Treating RN: [birth year] (52 y.o. [name] Primary Care Megha Agnes: [name] Other Clinician: Referring Khiree Bukhari: Treating Cerita Rabelo/Extender: [name] in Treatment: 29 Wound Status Wound Number: 3 Primary Diabetic Wound/Ulcer of the Lower Extremity Etiology: Wound Location: Left, Plantar Foot Wound Open Wounding Event: Gradually Appeared Status: Date Acquired: 10/01/2022 Comorbid Chronic sinus problems/congestion, Congestive Heart Failure, Weeks Of Treatment: 29 History: Coronary Artery Disease, Hypertension, Myocardial Infarction, Type Clustered Wound: No II Diabetes, Neuropathy Pending Amputation On Presentation Wound under treatment by Sheza Strickland outside of Wound Center Photos Wound Measurements Length: (cm) 1.7 Width: (cm) 0.7 Berg, Gevorg ([id number]) Depth: (cm) 0.1 Area: (cm) 0. Volume: (cm) 0. % Reduction in Area: 76.2% % Reduction in Volume: 88.2% [id number] Page 7 of 7 Epithelialization: Medium (34-66%) 935 Tunneling: No 093 Undermining: No Wound Description Classification: Grade 1 Wound Margin: Thickened Exudate Amount: Medium Exudate Type: Serosanguineous Exudate Color: red, brown Foul Odor After Cleansing: No Slough/Fibrino No Wound Bed Granulation Amount: Large (67-100%) Exposed Structure Granulation Quality: Red, Pink, Hyper-granulation Fascia Exposed: No Necrotic Amount: None Present (0%) Fat Layer (Subcutaneous Tissue) Exposed: Yes Tendon Exposed: No Muscle Exposed: No Joint Exposed: No Bone Exposed: No Periwound Skin Texture Texture Color No Abnormalities Noted: No No Abnormalities Noted: No Callus: Yes Atrophie Blanche: No Crepitus: No Cyanosis: No Excoriation: No Ecchymosis: No Induration:  No Erythema: No Rash: No Hemosiderin Staining: No Scarring: No Mottled: No Pallor: No Moisture Rubor: No No Abnormalities Noted: No Dry / Scaly: No Temperature / Pain Maceration: No Temperature:  No Abnormality Electronic Signature(s) Signed: 08/21/2023 4:35:35 PM By: Karie Schwalbe RN Signed: 08/26/2023 2:02:59 PM By: Brenton Grills Entered By: Brenton Grills on 08/21/2023 06:14:52 -------------------------------------------------------------------------------- Vitals Details Patient Name: Date of Service: [name], [name] 08/21/2023 8:45 A M Medical Record Number: [id number] Patient Account Number: [id number] Date of Birth/Sex: Treating RN: [birth year] (52 y.o. [name] Primary Care Jachin Coury: [name] Other Clinician: Referring Kamauri Denardo: Treating Sibel Khurana/Extender: [name] in Treatment: 29 Vital Signs Time Taken: 09:05 Temperature (F): 97.8 Height (in): 78 Pulse (bpm): 80 Weight (lbs): 275 Respiratory Rate (breaths/min): 18 Body Mass Index (BMI): 31.8 Blood Pressure (mmHg): 112/79 Reference Range: 80 - 120 mg / dl Electronic Signature(s) Signed: 08/21/2023 4:35:35 PM By: Karie Schwalbe RN Entered By: Karie Schwalbe on 08/21/2023 06:09:26

## 2023-08-28 ENCOUNTER — Ambulatory Visit (HOSPITAL_BASED_OUTPATIENT_CLINIC_OR_DEPARTMENT_OTHER): Payer: 59 | Admitting: Internal Medicine

## 2023-09-02 ENCOUNTER — Encounter (HOSPITAL_BASED_OUTPATIENT_CLINIC_OR_DEPARTMENT_OTHER): Payer: 59 | Attending: Internal Medicine | Admitting: Internal Medicine

## 2023-09-02 ENCOUNTER — Telehealth: Payer: Self-pay | Admitting: Pharmacist

## 2023-09-02 ENCOUNTER — Other Ambulatory Visit: Payer: Self-pay

## 2023-09-02 DIAGNOSIS — E1122 Type 2 diabetes mellitus with diabetic chronic kidney disease: Secondary | ICD-10-CM | POA: Insufficient documentation

## 2023-09-02 DIAGNOSIS — E1140 Type 2 diabetes mellitus with diabetic neuropathy, unspecified: Secondary | ICD-10-CM | POA: Diagnosis not present

## 2023-09-02 DIAGNOSIS — N189 Chronic kidney disease, unspecified: Secondary | ICD-10-CM | POA: Diagnosis not present

## 2023-09-02 DIAGNOSIS — I130 Hypertensive heart and chronic kidney disease with heart failure and stage 1 through stage 4 chronic kidney disease, or unspecified chronic kidney disease: Secondary | ICD-10-CM | POA: Insufficient documentation

## 2023-09-02 DIAGNOSIS — L97522 Non-pressure chronic ulcer of other part of left foot with fat layer exposed: Secondary | ICD-10-CM | POA: Diagnosis not present

## 2023-09-02 DIAGNOSIS — I5042 Chronic combined systolic (congestive) and diastolic (congestive) heart failure: Secondary | ICD-10-CM | POA: Insufficient documentation

## 2023-09-02 DIAGNOSIS — Z794 Long term (current) use of insulin: Secondary | ICD-10-CM | POA: Insufficient documentation

## 2023-09-02 DIAGNOSIS — I2510 Atherosclerotic heart disease of native coronary artery without angina pectoris: Secondary | ICD-10-CM | POA: Diagnosis not present

## 2023-09-02 DIAGNOSIS — E785 Hyperlipidemia, unspecified: Secondary | ICD-10-CM | POA: Insufficient documentation

## 2023-09-02 DIAGNOSIS — E11621 Type 2 diabetes mellitus with foot ulcer: Secondary | ICD-10-CM | POA: Diagnosis not present

## 2023-09-02 NOTE — Telephone Encounter (Signed)
Patient is calling requesting approval of [place]. It looks like this is non-formulary for him. Are we able to attempt a PA or will have to change to Jardiance?

## 2023-09-02 NOTE — Telephone Encounter (Signed)
I just realized that Jardiance gave him a rash and is listed as a medication allergy for him. Would we be able to appeal on this basis?

## 2023-09-03 ENCOUNTER — Other Ambulatory Visit: Payer: Self-pay

## 2023-09-04 ENCOUNTER — Other Ambulatory Visit: Payer: Self-pay

## 2023-09-04 ENCOUNTER — Encounter (HOSPITAL_BASED_OUTPATIENT_CLINIC_OR_DEPARTMENT_OTHER): Payer: 59 | Admitting: Internal Medicine

## 2023-09-04 ENCOUNTER — Ambulatory Visit (HOSPITAL_BASED_OUTPATIENT_CLINIC_OR_DEPARTMENT_OTHER): Payer: 59 | Admitting: Internal Medicine

## 2023-09-04 ENCOUNTER — Other Ambulatory Visit: Payer: Self-pay | Admitting: Critical Care Medicine

## 2023-09-04 DIAGNOSIS — E11621 Type 2 diabetes mellitus with foot ulcer: Secondary | ICD-10-CM | POA: Diagnosis not present

## 2023-09-04 DIAGNOSIS — I5042 Chronic combined systolic (congestive) and diastolic (congestive) heart failure: Secondary | ICD-10-CM | POA: Diagnosis not present

## 2023-09-04 DIAGNOSIS — E1169 Type 2 diabetes mellitus with other specified complication: Secondary | ICD-10-CM

## 2023-09-04 DIAGNOSIS — Z794 Long term (current) use of insulin: Secondary | ICD-10-CM | POA: Diagnosis not present

## 2023-09-04 DIAGNOSIS — E1122 Type 2 diabetes mellitus with diabetic chronic kidney disease: Secondary | ICD-10-CM | POA: Diagnosis not present

## 2023-09-04 DIAGNOSIS — I130 Hypertensive heart and chronic kidney disease with heart failure and stage 1 through stage 4 chronic kidney disease, or unspecified chronic kidney disease: Secondary | ICD-10-CM | POA: Diagnosis not present

## 2023-09-04 DIAGNOSIS — N189 Chronic kidney disease, unspecified: Secondary | ICD-10-CM | POA: Diagnosis not present

## 2023-09-04 DIAGNOSIS — E785 Hyperlipidemia, unspecified: Secondary | ICD-10-CM | POA: Diagnosis not present

## 2023-09-04 DIAGNOSIS — L97522 Non-pressure chronic ulcer of other part of left foot with fat layer exposed: Secondary | ICD-10-CM | POA: Diagnosis not present

## 2023-09-04 DIAGNOSIS — E1140 Type 2 diabetes mellitus with diabetic neuropathy, unspecified: Secondary | ICD-10-CM | POA: Diagnosis not present

## 2023-09-04 DIAGNOSIS — I2510 Atherosclerotic heart disease of native coronary artery without angina pectoris: Secondary | ICD-10-CM | POA: Diagnosis not present

## 2023-09-04 MED ORDER — ATORVASTATIN CALCIUM 80 MG PO TABS
80.0000 mg | ORAL_TABLET | Freq: Every evening | ORAL | 1 refills | Status: DC
Start: 2023-09-04 — End: 2023-11-25
  Filled 2023-09-04: qty 30, fill #0
  Filled 2023-09-09: qty 30, 30d supply, fill #0
  Filled 2023-10-28: qty 30, 30d supply, fill #1

## 2023-09-05 ENCOUNTER — Other Ambulatory Visit: Payer: Self-pay

## 2023-09-05 NOTE — Progress Notes (Signed)
JAELIN, CALLO ([id number]) [id number] Page 1 of 7 Visit Report for 09/04/2023 Arrival Information Details Patient Name: Date of Service: [name] [place], [name] RO LD 09/04/2023 9:15 A M Medical Record Number: [id number] Patient Account Number: [id number] Date of Birth/Sex: Treating RN: November 10, 1971 (52 y.o. [name], [name] Primary Care [name]: [name] Other Clinician: Referring [name]: Treating [name]/Extender: [name] in Treatment: 31 Visit Information History Since Last Visit Added or deleted any medications: No Patient Arrived: Ambulatory Any new allergies or adverse reactions: No Arrival Time: 09:11 Had a fall or experienced change in No Accompanied By: self activities of daily living that may affect Transfer Assistance: None risk of falls: Patient Identification Verified: Yes Signs or symptoms of abuse/neglect since last visito No Secondary Verification Process Completed: Yes Hospitalized since last visit: No Patient Requires Transmission-Based Precautions: No Implantable device outside of the clinic excluding No Patient Has Alerts: Yes cellular tissue based products placed in the center Patient Alerts: 08/2021 ABI:1.2 TBI 0.79 since last visit: Has Dressing in Place as Prescribed: Yes Has Footwear/Offloading in Place as Prescribed: Yes Left: T Contact Cast otal Pain Present Now: No Electronic Signature(s) Signed: 09/04/2023 5:00:34 PM By: [name] Entered By: [name] on 09/04/2023 06:14:49 -------------------------------------------------------------------------------- Encounter Discharge Information Details Patient Name: Date of Service: [name] [place], [name] 09/04/2023 9:15 A M Medical Record Number: [id number] Patient Account Number: [id number] Date of Birth/Sex: Treating RN: 02-18-1971 (52 y.o. [name] Primary Care Jarel Cuadra: [name] Other Clinician: Referring  [name]: Treating [name]/Extender: [name] in Treatment: 31 Encounter Discharge Information Items Discharge Condition: Stable Ambulatory Status: Ambulatory Discharge Destination: Home Transportation: Private Auto Accompanied By: self Schedule Follow-up Appointment: Yes Clinical Summary of Care: Electronic Signature(s) Signed: 09/04/2023 5:00:34 PM By: [name] Entered By: [name] on 09/04/2023 06:46:23 [name] ([id number]) [id number] Page 2 of 7 -------------------------------------------------------------------------------- Lower Extremity Assessment Details Patient Name: Date of Service: [name] [place], [name] RO LD 09/04/2023 9:15 A M Medical Record Number: [id number] Patient Account Number: [id number] Date of Birth/Sex: Treating RN: 23-Jul-1971 (52 y.o. [name] Primary Care [name] Other Clinician: Referring Aerielle Stoklosa: Treating [name]/Extender: [name] in Treatment: 31 Edema Assessment Assessed: [name]: Yes] [name]: No] Edema: [Left: N] [Right: o] Calf Left: Right: Point of Measurement: 43 cm From Medial Instep 37 cm Ankle Left: Right: Point of Measurement: 9 cm From Medial Instep 22 cm Vascular Assessment Pulses: Dorsalis Pedis Palpable: [Left:Yes] Extremity colors, hair growth, and conditions: Extremity Color: [Left:Normal] Hair Growth on Extremity: [Left:Yes] Temperature of Extremity: [Left:Warm] Capillary Refill: [Left:< 3 seconds] Dependent Rubor: [Left:No] Blanched when Elevated: [Left:No No] Toe Nail Assessment Left: Right: Thick: No Discolored: No Deformed: No Electronic Signature(s) Signed: 09/04/2023 5:00:34 PM By: [name] Entered By: [name] on 09/04/2023 06:17:44 -------------------------------------------------------------------------------- Multi Wound Chart Details Patient Name: Date of  Service: [name] [place], [name] 09/04/2023 9:15 A M Medical Record Number: [id number] Patient Account Number: [id number] Date of Birth/Sex: Treating RN: [birth year] (52 y.o. M) Primary Care [name]: [name] Other Clinician: Referring [name]: Treating [name]/Extender: [name] in Treatment: 31 Vital Signs Height(in): 78 Pulse(bpm): 81 Weight(lbs): 275 Blood Pressure(mmHg): 108/78 Body Mass Index(BMI): 31.8 Temperature(F): 97.9 Respiratory Rate(breaths/min): 16 [name], Glenn ([id number]) [id number] Page 3 of 7 [3:Photos: No Photos Left, Plantar Foot Wound Location: Gradually Appeared Wounding Event: Diabetic Wound/Ulcer of the Lower Primary Etiology: Extremity Chronic sinus problems/congestion, Comorbid History:  Congestive Heart Failure, Coronary Artery Disease,  Hypertension, Myocardial Infarction, Type II Diabetes, Neuropathy 10/01/2022 Date Acquired: 31 Weeks of Treatment: Open Wound Status: No Wound Recurrence: Yes Pending [name] on Presentation: 0.9x0.4x0.2 Measurements L x W x D (cm) 0.283 A (cm) : rea  0.057 Volume (cm) : 92.80% % Reduction in A rea: 92.70% % Reduction in Volume: Grade 1 Classification: Medium Exudate A mount: Serosanguineous Exudate Type: red, brown Exudate Color: Thickened Wound Margin: Large (67-100%) Granulation A mount: Red, Pink  Granulation Quality: None Present (0%) Necrotic A mount: Fat Layer (Subcutaneous Tissue): Yes N/A Exposed Structures: Fascia: No Tendon: No Muscle: No Joint: No Bone: No Medium (34-66%) Epithelialization: Callus: Yes Periwound Skin Texture: Excoriation:  No Induration: No Crepitus: No Rash: No Scarring: No Maceration: No Periwound Skin Moisture: Dry/Scaly: No Atrophie Blanche: No Periwound Skin Color: Cyanosis: No Ecchymosis: No Erythema: No Hemosiderin Staining: No Mottled: No Pallor: No Rubor: No No  Abnormality Temperature: T Contact Cast otal Procedures Performed:]  [N/A:N/A N/A N/A N/A N/A N/A N/A N/A N/A N/A N/A N/A N/A N/A N/A N/A N/A N/A N/A N/A N/A N/A N/A N/A N/A N/A N/A N/A N/A] Treatment Notes Wound #3 (Foot) Wound Laterality: Plantar, Left Cleanser Soap and Water Discharge Instruction: May shower and wash wound with dial antibacterial soap and water prior to dressing change. Vashe 5.8 (oz) Discharge Instruction: Cleanse the wound with Vashe prior to applying a clean dressing using gauze sponges, not tissue or cotton balls. Peri-Wound Care Topical Gentamicin Discharge Instruction: As directed by physician Mupirocin Ointment Discharge Instruction: Apply Mupirocin (Bactroban) as instructed Primary Dressing Hydrofera Blue Ready Transfer Foam, 2.5x2.5 (in/in) Discharge Instruction: Apply directly to wound bed as directed Secondary Dressing ABD Pad, 5x9 ALADDIN, BOSMA ([id number]) [id number] Page 4 of 7 Secured With [employer], 4.5x3.1 (in/yd) Discharge Instruction: Secure with Kerlix as directed. 78M Medipore H Soft Cloth Surgical T ape, 4 x 10 (in/yd) Discharge Instruction: Secure with tape as directed. Compression Wrap Compression Stockings Add-Ons Electronic Signature(s) Signed: 09/04/2023 5:29:38 PM By: Geralyn Corwin DO Entered By: Geralyn Corwin on 09/04/2023 07:10:34 -------------------------------------------------------------------------------- Multi-Disciplinary Care Plan Details Patient Name: Date of Service: [name] [place], [name] 09/04/2023 9:15 A M Medical Record Number: [id number] Patient Account Number: [id number] Date of Birth/Sex: Treating RN: 07-17-1971 (52 y.o. [name] Primary Care Remer Couse: [name] Other Clinician: Referring Sherley Mckenney: Treating Jiles Goya/Extender: [name] in Treatment: 31 Active Inactive Wound/Skin Impairment Nursing Diagnoses: Impaired tissue integrity Knowledge deficit related to ulceration/compromised skin  integrity Goals: Patient will have a decrease in wound volume by X% from date: (specify in notes) Date Initiated: 01/24/2023 Target Resolution Date: 08/30/2023 Goal Status: Active Patient/caregiver will verbalize understanding of skin care regimen Date Initiated: 01/24/2023 Target Resolution Date: 08/30/2023 Goal Status: Active Ulcer/skin breakdown will have a volume reduction of 30% by week 4 Date Initiated: 01/24/2023 Date Inactivated: 03/07/2023 Target Resolution Date: 02/27/2023 Goal Status: Unmet Unmet Reason: larger today. Ulcer/skin breakdown will have a volume reduction of 50% by week 8 Date Initiated: 01/24/2023 Target Resolution Date: 08/30/2023 Goal Status: Active Interventions: Assess patient/caregiver ability to obtain necessary supplies Assess patient/caregiver ability to perform ulcer/skin care regimen upon admission and as needed Assess ulceration(s) every visit Notes: Electronic Signature(s) Signed: 09/04/2023 5:00:34 PM By: [name] Entered By: [name] on 09/04/2023 06:45:29 [name] ([id number]) [id number] Page 5 of 7 -------------------------------------------------------------------------------- Pain Assessment Details Patient Name: Date of Service: [name] [place], [name] RO LD 09/04/2023 9:15 A M  Medical Record Number: [id number] Patient Account Number: [id number] Date of Birth/Sex: Treating RN: 1971/06/29 (52 y.o. [name] Primary Care Rosell Khouri: [name] Other Clinician: Referring Markanthony Gedney: Treating Asheton Scheffler/Extender: [name] in Treatment: 31 Active Problems Location of Pain Severity and Description of Pain Patient Has Paino No Site Locations Pain Management and Medication Current Pain Management: Electronic Signature(s) Signed: 09/04/2023 5:00:34 PM By: [name] Entered By: [name] on 09/04/2023  06:15:35 -------------------------------------------------------------------------------- Patient/Caregiver Education Details Patient Name: Date of Service: [name] [place], [name] 9/5/2024andnbsp9:15 A M Medical Record Number: [id number] Patient Account Number: [id number] Date of Birth/Gender: Treating RN: 14-Dec-1971 (52 y.o. [name] Primary Care Physician: [name] Other Clinician: Referring Physician: Treating Physician/Extender: [name] in Treatment: 31 Education Assessment Education Provided To: Patient Education Topics Provided Wound/Skin Impairment: Handouts: Caring for Your Ulcer Methods: Explain/Verbal Responses: Reinforcements needed [place], [name] ([id number]) [id number] Page 6 of 7 Electronic Signature(s) Signed: 09/04/2023 5:00:34 PM By: [name] Entered By: [name] on 09/04/2023 06:45:41 -------------------------------------------------------------------------------- Wound Assessment Details Patient Name: Date of Service: [name], [name] 09/04/2023 9:15 A M Medical Record Number: [id number] Patient Account Number: [id number] Date of Birth/Sex: Treating RN: 26-Jun-1971 (52 y.o. [name], [name] Primary Care Hosanna Betley: [name] Other Clinician: Referring Devery Murgia: Treating Lakeidra Reliford/Extender: [name] in Treatment: 31 Wound Status Wound Number: 3 Primary Diabetic Wound/Ulcer of the Lower Extremity Etiology: Wound Location: Left, Plantar Foot Wound Open Wounding Event: Gradually Appeared Status: Date Acquired: 10/01/2022 Comorbid Chronic sinus problems/congestion, Congestive Heart Failure, Weeks Of Treatment: 31 History: Coronary Artery Disease, Hypertension, Myocardial Infarction, Type Clustered Wound: No II Diabetes, Neuropathy Pending Amputation On Presentation Wound under treatment by Hannan Tetzlaff outside of Wound Center Wound  Measurements Length: (cm) 0.9 Width: (cm) 0.4 Depth: (cm) 0.2 Area: (cm) 0.283 Volume: (cm) 0.057 % Reduction in Area: 92.8% % Reduction in Volume: 92.7% Epithelialization: Medium (34-66%) Tunneling: No Undermining: No Wound Description Classification: Grade 1 Wound Margin: Thickened Exudate Amount: Medium Exudate Type: Serosanguineous Exudate Color: red, brown Foul Odor After Cleansing: No Slough/Fibrino No Wound Bed Granulation Amount: Large (67-100%) Exposed Structure Granulation Quality: Red, Pink Fascia Exposed: No Necrotic Amount: None Present (0%) Fat Layer (Subcutaneous Tissue) Exposed: Yes Tendon Exposed: No Muscle Exposed: No Joint Exposed: No Bone Exposed: No Periwound Skin Texture Texture Color No Abnormalities Noted: No No Abnormalities Noted: No Callus: Yes Atrophie Blanche: No Crepitus: No Cyanosis: No Excoriation: No Ecchymosis: No Induration: No Erythema: No Rash: No Hemosiderin Staining: No Scarring: No Mottled: No Pallor: No Moisture Rubor: No No Abnormalities Noted: No Dry / Scaly: No Temperature / Pain Maceration: No Temperature: No Abnormality Treatment Notes Wound #3 (Foot) Wound Laterality: Plantar, Left DEMON, HOTOP ([id number]) [id number] Page 7 of 7 Cleanser Soap and Water Discharge Instruction: May shower and wash wound with dial antibacterial soap and water prior to dressing change. Vashe 5.8 (oz) Discharge Instruction: Cleanse the wound with Vashe prior to applying a clean dressing using gauze sponges, not tissue or cotton balls. Peri-Wound Care Topical Gentamicin Discharge Instruction: As directed by physician Mupirocin Ointment Discharge Instruction: Apply Mupirocin (Bactroban) as instructed Primary Dressing Hydrofera Blue Ready Transfer Foam, 2.5x2.5 (in/in) Discharge Instruction: Apply directly to wound bed as directed Secondary Dressing ABD Pad, 5x9 Secured With [employer], 4.5x3.1 (in/yd) Discharge Instruction: Secure with Kerlix as directed. 85M Medipore H Soft Cloth Surgical T ape, 4 x 10 (in/yd) Discharge Instruction: Secure with tape  as directed. Compression Wrap Compression Stockings Add-Ons Electronic Signature(s) Signed: 09/04/2023 3:50:25 PM By: Karie Schwalbe RN Signed: 09/04/2023 5:00:34 PM By: [name] Entered By: Karie Schwalbe on 09/04/2023 06:43:55 -------------------------------------------------------------------------------- Vitals Details Patient Name: Date of Service: [name] [place], [name] 09/04/2023 9:15 A M Medical Record Number: [id number] Patient Account Number: [id number] Date of Birth/Sex: Treating RN: 1971-10-24 (52 y.o. [name], Yvonne Kendall Primary Care Ranulfo Kall: [name] Other Clinician: Referring Renato Spellman: Treating Jailon Schaible/Extender: [name] in Treatment: 31 Vital Signs Time Taken: 09:14 Temperature (F): 97.9 Height (in): 78 Pulse (bpm): 81 Weight (lbs): 275 Respiratory Rate (breaths/min): 16 Body Mass Index (BMI): 31.8 Blood Pressure (mmHg): 108/78 Reference Range: 80 - 120 mg / dl Electronic Signature(s) Signed: 09/04/2023 5:00:34 PM By: [name] Entered By: [name] on 09/04/2023 78:46:96

## 2023-09-05 NOTE — Progress Notes (Signed)
AKILI, ANDUJO ([id number]) [id number] Page 1 of 11 Visit Report for 09/04/2023 Chief Complaint Document Details Patient Name: Date of Service: [name] [place], [name] RO LD 09/04/2023 9:15 A M Medical Record Number: [id number] Patient Account Number: [id number] Date of Birth/Sex: Treating RN: Jan 05, 1971 (52 y.o. M) Primary Care Provider: [name] Other Clinician: Referring Provider: Treating Provider/Extender: [name] in Treatment: 31 Information Obtained from: Patient Chief Complaint 01/15/2023; left foot wound Electronic Signature(s) Signed: 09/04/2023 5:29:38 PM By: Geralyn Corwin DO Entered By: Geralyn Corwin on 09/04/2023 07:10:42 -------------------------------------------------------------------------------- HPI Details Patient Name: Date of Service: [name] [place], [name] 09/04/2023 9:15 A M Medical Record Number: [id number] Patient Account Number: [id number] Date of Birth/Sex: Treating RN: 12/11/1971 (52 y.o. M) Primary Care Provider: [name] Other Clinician: Referring Provider: Treating Provider/Extender: [name] in Treatment: 31 History of Present Illness HPI Description: ADMISSION 01/11/2020 This is a 52 year old man with uncontrolled type 2 diabetes with a recent hemoglobin A1c earlier this year of 13.4. He is on insulin and glipizide. He does not take his blood sugars at home. He does have a follow-up with primary care later this month I believe on January 27. He tells [place] that roughly a month ago he was walking with a shoe with a hole in his foot. He took the shoe off and there was an open wound at roughly the left fourth met head. This has significant undermining and raised edges. He has not noticed any purulence he does not feel unwell. More recently he was taking skin off the bottom of his foot and has a superficial area on the left fifth met head. He has not been offloading  this. The patient was in the ER on 12/20. They gave him Bactroban which she has been using on the wound and 10 days worth of doxycycline. No x-rays were done. He has not had vascular studies. He is also been using hydrogen peroxide. Past medical history type 2 diabetes uncontrolled, chronic systolic heart failure, coronary artery disease with a history of congestive heart failure with stents. Hypertension hyperlipidemia and chronic renal insufficiency ABI in this clinic was 1.14 on the left. 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 He is uncertain whether he would be able to work if we put him on some form of restriction 1/19; he is generally doing quite well. Using silver alginate on the wounds. Things actually look better. He has a forefoot offloading boot which she seems to be compliant about. He has support at work to stay off his foot is much as possible which is gratifying. Culture I did last week showed a few Enterococcus faecalis. I am going to put him on Augmentin. I talked about ordering an x-ray in my note last week but that does not seem to have happened. We will review reorder the x-ray this week. 1/26; x-ray reordered last week was negative for osteomyelitis. We are using silver alginate on the wound on the third and fifth met heads on the left. He is using a Darco forefoot off loader 2/2; the area on the fifth met head is closed. Third met head is still open with tunneling depth and thick callus. 2/9; the area on the fifth met head remains closed however the third met head again has a small open area on presentation with tunneling in depth and surrounded by thick callus. This looks like a pressure issue. We have been using silver alginate  2/16; the area of the fifth metatarsal head remains closed however the area over the third metatarsal head again is a small open area but with some depth. I do not think this is changed much since last week. He is using  Hydrofera Blue with forefoot off loader. He is not able to use a total contact cast on the left leg [name], [name] ([id number]) [id number] Page 2 of 11 because he needs his left leg at work [employer]]. Fortunately the wound does not look infected. I changed him to endoform today 2/26; the area of the fifth metatarsal head remains closed. The area of the third metatarsal head has an even smaller opening this time. I used endoform on this this looks improved. He is offloading this is much as he can and a forefoot off loader on the left. He cannot have a total contact cast because of work responsibilities 3/5; the area on the fifth metatarsal head remains closed the area on the third metatarsal head is also closed on the left foot. 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 He has been following for podiatry for several months for this issue. He is currently using wet-to-dry saline dressings. He currently denies signs of infection. Progresse note from 1/17; Patient presents with a 40-month history of nonhealing ulcer to the left foot secondary to diabetes and inability to offload well the area. He has had multiple debridements in the past to his feet bilaterally. He has had resection of the fourth left met head in the fifth toe Secondary to osteomyelitis. We discussed the importance of glycemic control for wound healing. Due to his blood glucose levels being elevated he is at high risk for infection and thus further amputation. He expressed understanding. He states he is supposed to be referred to an  endocrinologist at [facility] however the referral fell through. Unclear what happened. Offered a referral to endocrinology at [facility]. Patient was agreeable.Furthermore we discussed the importance of aggressive offloading for his wound healing. This will be the most difficult part of the treatment plan for the patient to do. We discussed a total contact cast however he has declined that at this time. 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 He is currently using a surgical shoe with a peg assist. It does not fit well so we will give him a new one today. I do not think this is enough offloading. If he is not able to offload this area he will likely end up with a BKA. He is well aware of this. For now I recommended Medihoney and Hydrofera Blue for dressing changes. He will follow-up in our [place] office Since this is closer for him. 1/26; patient presents for follow-up. Last clinic visit I had seen him in [place] and we transferred him to [place] since this is a closer location for him. Progress note above from that visit. Over the past week He has been using Medihoney and Hydrofera Blue to the wound bed. He has been using his surgical shoe with peg assist. He has no issues or complaints today. We  discussed doing the total contact cast and he was agreeable to having this placed at next clinic visit. 2/2; patient presents for follow-up. He has been using Vashe wet-to-dry dressings. Plan is for the total contact cast today. 2/5; patient presents for follow-up. The cast was placed 3 days ago. He tolerated this well however had a lot of drainage. He currently denies signs of infection. 2/9; patient presents for follow-up. At last clinic visit we held off on replacing the cast due to drainage. He has been using Vashe wet-to-dry dressings and he is currently taking the oral antibiotics prescribed without issues. He declines a total  contact cast today. We ran insurance verification for skin substitute and due to cost patient declines having this placed. 2/16; patient presents for follow-up. He has decided not to follow-up with podiatry. He continues to decline the total contact cast. He has been using Vashe wet- to-dry dressings. He has not been wearing his surgical shoe with peg assist. He currently denies signs of infection. 3/1; patient presents for follow-up. He has been doing Vashe wet-to-dry dressings. He has a surgical shoe with peg assist. We discussed potentially doing a skin substitute for which she has been approved for by insurance. He knows the out-of-pocket cost of this and would still like to proceed with having this placed today. He denies the current total contact cast. 3/8; patient presents for follow-up. He states that the skin substitute came off after a few days and he has been using Vashe wet-to-dry dressings. He has mild odor to the wound bed on exam. 3/15; patient presents for follow-up. He has been using Vashe wet-to-dry dressings and started his course of Augmentin. He reports improvement in wound healing. He has no issues or complaints today. 3/21; patient presents for follow-up. He continues to use Vashe wet-to-dry dressings. He has no issues or complaints today. 3/28; patient presents for follow-up. He has been using Vashe wet-to-dry dressings. He has no issues or complaints today. He has been using his peg assist with surgical shoe. 4/11; patient presents for follow-up. He has been using Vashe wet-to-dry dressings. The periwound is macerated. He has been using his peg assist with surgical shoe. He has no issues or complaints today. 4/25; patient presents for follow-up. Has been using Vashe wet-to-dry dressings. He has been using zinc oxide to the periwound. For offloading he has been using his peg assist in surgical shoe. He has no issues or complaints today. 5/9; patient presents for follow-up.  He has been using Vashe wet-to-dry dressings. He has no issues or complaints today. He has a surgical shoe with peg assist for offloading. 5/23; patient presents for follow up. He has been using Vashe to clean the wound bed and collagen and blast X with dressing changes. [place] went [place] the beach [place] 2 [place]. Wound is bigger. 5/31; patient presents for follow-up. He has been using Vashe wet-to-dry dressings. Wound is stable but with healthier granulation tissue. Patient would like to hold off on the total contact cast and try a knee scooter for offloading. 6/13; patient presents for follow-up. He has been using Vashe wet-to-dry dressings. He has been using a knee scooter for offloading however states he felt unstable on this and decided to buy an electric scooter. This arrived in the mail today. He would like to hold off on the left total contact cast. Wound is slightly smaller today. 6/27; patient presents for follow-up. He has been using Vashe wet-to-dry dressings. He is using an Art gallery manager for  mobility to help relieve the pressure off the wound bed. Wound is smaller today. He wants to hold off on doing the total contact cast as he is showing signs of improvement with wound healing. 7/11; patient presents for follow-up. He has been using Vashe wet-to-dry dressings and his electric scooter for offloading. Wound is slightly smaller. 7/25; patient presents for follow up. He has been using Vashe wet-to-dry dressings and he reports using his electric scooter for offloading. Wound is larger. 7/30; patient presents for follow-up. He has been using Vashe wet-to-dry dressings. He has taken Augmentin as prescribed without issues. Overall wound has healthier granulation tissue. 8/1; patient presents for follow-up. He presents for obligatory cast change. He tolerated the cast well and has no issues or complaints today. We have been using antibiotic ointment with Hydrofera Blue under the cast. Wound is  slightly smaller. 8/8; patient presents for follow-up. We have been using antibiotic ointment with Hydrofera Blue under the total contact cast. Wound is smaller. He has no issues or complaints today. 8/13; patient presents for follow-up. We have been using antibiotic ointment with Hydrofera Blue under the total contact cast. He comes in today because the top part of the cast over the foot has sunken as well as on the side. He wanted to assure that there were no issues. [name], [name] ([id number]) [id number] Page 3 of 11 8/22; patient presents for follow-up. He has been using Hydrofera Blue to the wound bed. Previously we have been using a total contact cast. We do not have total contact casts due to manufacturing issues available for patients. He has no issues or complaints. He denies signs of infection. 8/27; patient presents for follow-up. He has been using Vashe wet-to-dry dressings. T contact cast is available for placement today. He was agreeable to otal proceed with this. He denies signs of infection. 9/3; patient presents for follow-up. We have been using Hydrofera Blue with antibiotic ointment under the total contact cast for the past week. He has tolerated this well. The wound is smaller. Unfortunately we do not have a total contact cast in office today for replacement as there is been manufacturing issues. 9/5; patient was seen earlier this week and plan was for a total contact cast but there is none available. The cast finally arrived in patient has come in today to have this placed. We will continue with Hydrofera Blue and antibiotic ointment under the cast. Electronic Signature(s) Signed: 09/04/2023 5:29:38 PM By: Geralyn Corwin DO Entered By: Geralyn Corwin on 09/04/2023 07:11:39 -------------------------------------------------------------------------------- Physical Exam Details Patient Name: Date of Service: [name] [place], [name] 09/04/2023 9:15 A  M Medical Record Number: [id number] Patient Account Number: [id number] Date of Birth/Sex: Treating RN: 03/02/1971 (52 y.o. M) Primary Care Provider: [name] Other Clinician: Referring Provider: Treating Provider/Extender: [name] in Treatment: 31 Constitutional respirations regular, non-labored and within target range for patient.. Cardiovascular 2+ dorsalis pedis/posterior tibialis pulses. Psychiatric pleasant and cooperative. Notes Left foot: T the lateral aspect fifth met head there is an open wound with granulation tissue. No erythema or increased warmth to the periwound. o Electronic Signature(s) Signed: 09/04/2023 5:29:38 PM By: Geralyn Corwin DO Entered By: Geralyn Corwin on 09/04/2023 07:12:44 -------------------------------------------------------------------------------- Physician Orders Details Patient Name: Date of Service: [name] [place], [name] 09/04/2023 9:15 A M Medical Record Number: [id number] Patient Account Number: [id number] Date of Birth/Sex: Treating RN: [birth year] (52 y.o. [name] Primary Care Provider: [name] Other Clinician: Referring Provider: Treating  Provider/Extender: [name] in Treatment: 31 Verbal / Phone Orders: No Diagnosis Coding Follow-up Appointments ppointment in 1 week. - Dr Mikey Bussing Room 8 0930 09/11/2023 Return A ppointment in 2 weeks. - switch to Dr. Lady Gary 09/17/2023 0745 Return A Return appointment in 3 weeks. - Dr. Lady Gary please scheduled for patient. Anesthetic [name], [name] ([id number]) [id number] Page 4 of 11 (In clinic) Topical Lidocaine 4% applied to wound bed Cellular or Tissue Based Products Cellular or Tissue Based Product Type: - apligraf and grafix 50% coinsurance 02/28/23-apply 1st Grafix 03/07/2023 HOLD THIS WEEK. Bathing/ Shower/ Hygiene May shower with protection but do not get wound dressing(s) wet.  Protect dressing(s) with water repellant cover (for example, large plastic bag) or a cast cover and may then take shower. Off-Loading Total Contact Cast to Left Lower Extremity - size 3 heel cup to toes. foam pad to anterior ankle- applied 08/26/2023 Wound Treatment Wound #3 - Foot Wound Laterality: Plantar, Left Cleanser: Soap and Water 1 x Per Day/30 Days Discharge Instructions: May shower and wash wound with dial antibacterial soap and water prior to dressing change. Cleanser: Vashe 5.8 (oz) 1 x Per Day/30 Days Discharge Instructions: Cleanse the wound with Vashe prior to applying a clean dressing using gauze sponges, not tissue or cotton balls. Topical: Gentamicin 1 x Per Day/30 Days Discharge Instructions: As directed by physician Topical: Mupirocin Ointment 1 x Per Day/30 Days Discharge Instructions: Apply Mupirocin (Bactroban) as instructed Prim Dressing: Hydrofera Blue Ready Transfer Foam, 2.5x2.5 (in/in) 1 x Per Day/30 Days ary Discharge Instructions: Apply directly to wound bed as directed Secondary Dressing: ABD Pad, 5x9 1 x Per Day/30 Days Secured With: Kerlix Roll Sterile, 4.5x3.1 (in/yd) 1 x Per Day/30 Days Discharge Instructions: Secure with Kerlix as directed. Secured With: 83M Medipore H Soft Cloth Surgical T ape, 4 x 10 (in/yd) 1 x Per Day/30 Days Discharge Instructions: Secure with tape as directed. Electronic Signature(s) Signed: 09/04/2023 5:29:38 PM By: Geralyn Corwin DO Entered By: Geralyn Corwin on 09/04/2023 07:13:01 -------------------------------------------------------------------------------- Problem List Details Patient Name: Date of Service: [name] [place], [name] 09/04/2023 9:15 A M Medical Record Number: [id number] Patient Account Number: [id number] Date of Birth/Sex: Treating RN: [birth year] (52 y.o. M) Primary Care Provider: [name] Other Clinician: Referring Provider: Treating Provider/Extender: [name] in  Treatment: 31 Active Problems ICD-10 Encounter Code Description Active Date MDM Diagnosis [phone] Non-pressure chronic ulcer of other part of left foot with fat layer exposed 01/24/2023 No Yes E11.621 Type 2 diabetes mellitus with foot ulcer 01/24/2023 No Yes [name], [name] ([id number]) [id number] Page 5 of [phone] Acquired absence of other left toe(s) 01/24/2023 No Yes Inactive Problems Resolved Problems Electronic Signature(s) Signed: 09/04/2023 5:29:38 PM By: Geralyn Corwin DO Entered By: Geralyn Corwin on 09/04/2023 07:10:28 -------------------------------------------------------------------------------- Progress Note Details Patient Name: Date of Service: [name] [place], [name] 09/04/2023 9:15 A M Medical Record Number: [id number] Patient Account Number: [id number] Date of Birth/Sex: Treating RN: Mar 31, 1971 (52 y.o. M) Primary Care Provider: [name] Other Clinician: Referring Provider: Treating Provider/Extender: [name] in Treatment: 31 Subjective Chief Complaint Information obtained from Patient 01/15/2023; left foot wound History of Present Illness (HPI) ADMISSION 01/11/2020 This is a 52 year old man with uncontrolled type 2 diabetes with a recent hemoglobin A1c earlier this year of 13.4. He is on insulin and glipizide. He does not take his blood sugars at home. He does have a follow-up with primary care later this month I believe on  January 27. He tells [place] that roughly a month ago he was walking with a shoe with a hole in his foot. He took the shoe off and there was an open wound at roughly the left fourth met head. This has significant undermining and raised edges. He has not noticed any purulence he does not feel unwell. More recently he was taking skin off the bottom of his foot and has a superficial area on the left fifth met head. He has not been offloading this. The patient was in the ER on 12/20.  They gave him Bactroban which she has been using on the wound and 10 days worth of doxycycline. No x-rays were done. He has not had vascular studies. He is also been using hydrogen peroxide. Past medical history type 2 diabetes uncontrolled, chronic systolic heart failure, coronary artery disease with a history of congestive heart failure with stents. Hypertension hyperlipidemia and chronic renal insufficiency ABI in this clinic was 1.14 on the left. 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 He is uncertain whether he would be able to work if we put him on some form of restriction 1/19; he is generally doing quite well. Using silver alginate on the wounds. Things actually look better. He has a forefoot offloading boot which she seems to be compliant about. He has support at work to stay off his foot is much as possible which is gratifying. Culture I did last week showed a few Enterococcus faecalis. I am going to put him on Augmentin. I talked about ordering an x-ray in my note last week but that does not seem to have happened. We will review reorder the x-ray this week. 1/26; x-ray reordered last week was negative for osteomyelitis. We are using silver alginate on the wound on the third and fifth met heads on the left. He is using a Darco forefoot off loader 2/2; the area on the fifth met head is closed. Third met head is still open with tunneling depth and thick callus. 2/9; the area on the fifth met head remains closed however the third met head again has a small open area on presentation with tunneling in depth and surrounded by thick callus. This looks like a pressure issue. We have been using silver alginate 2/16; the area of the fifth metatarsal head remains closed however the area over the third metatarsal head again is a small open area but with some depth. I do not think this is changed much since last week. He is using Hydrofera Blue with forefoot off loader. He  is not able to use a total contact cast on the left leg because he needs his left leg at work [employer]]. Fortunately the wound does not look infected. I changed him to endoform today 2/26; the area of the fifth metatarsal head remains closed. The area of the third metatarsal head has an even smaller opening this time. I used endoform on this this looks improved. He is offloading this is much as he can and a forefoot off loader on the left. He cannot have a total contact cast because of work responsibilities 3/5; the area on the fifth metatarsal head remains closed the area on the third metatarsal head is also closed on the left foot. 01/15/2023 Mr. 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 He has been following for podiatry for several months for this issue. He is currently using wet-to-dry saline dressings. He currently denies signs of infection. Progresse note from 1/17; Patient presents with a 61-month history of nonhealing ulcer to the left foot secondary to diabetes and inability to offload well the area. He has had multiple debridements in the past to his feet bilaterally. He has had resection of the fourth left met head in the fifth toe Secondary to [place] ([id number]) [id number] Page 6 of 11 osteomyelitis. We discussed the importance of glycemic control for wound healing. Due to his blood glucose levels being elevated he is at high risk for infection and thus further amputation. He expressed understanding. He states he is supposed to be referred to an endocrinologist at [facility] however the referral fell  through. Unclear what happened. Offered a referral to endocrinology at [name]. Patient was agreeable.Furthermore we discussed the importance of aggressive offloading for his wound healing. This will be the most difficult part of the treatment plan for the patient to do. We discussed a total contact cast however he has declined that at this time. 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 He is currently using a surgical shoe with a peg assist. It does not fit well so we will give him a new one today. I do not think this is enough offloading. If he is not able to offload this area he will likely end up with a BKA. He is well aware of this. For now I recommended Medihoney and Hydrofera Blue for dressing changes. He will follow-up in our [place] office Since this is closer for him. 1/26; patient presents for follow-up. Last clinic visit I had seen him in [place] and we transferred him to [place] since this is a closer location for him. Progress note above from that visit. Over the past week He has been using Medihoney and Hydrofera Blue to the wound bed. He has been using his surgical shoe with peg assist. He has no issues or complaints today. We discussed doing the total contact cast and he was agreeable to having this placed at next clinic visit. 2/2; patient presents for follow-up. He has been using Vashe wet-to-dry dressings. Plan is for the total contact cast today. 2/5; patient presents for follow-up. The cast was placed 3 days ago. He tolerated this well however had a lot of drainage. He currently denies signs of infection. 2/9; patient presents for follow-up. At last clinic visit we held off on replacing the cast due to drainage. He has been using Vashe wet-to-dry dressings and he is currently taking the oral antibiotics prescribed without issues. He declines a total contact cast today. We ran insurance verification  for skin substitute and due to cost patient declines having this placed. 2/16; patient presents for follow-up. He has decided not to follow-up with podiatry. He continues to decline the total contact cast. He has been using Vashe wet- to-dry dressings. He has not been wearing his surgical shoe with peg assist. He currently denies signs of infection. 3/1; patient presents for follow-up. He has been doing Vashe wet-to-dry dressings. He has a surgical shoe with peg assist. We discussed potentially doing a skin substitute for which she has been  approved for by insurance. He knows the out-of-pocket cost of this and would still like to proceed with having this placed today. He denies the current total contact cast. 3/8; patient presents for follow-up. He states that the skin substitute came off after a few days and he has been using Vashe wet-to-dry dressings. He has mild odor to the wound bed on exam. 3/15; patient presents for follow-up. He has been using Vashe wet-to-dry dressings and started his course of Augmentin. He reports improvement in wound healing. He has no issues or complaints today. 3/21; patient presents for follow-up. He continues to use Vashe wet-to-dry dressings. He has no issues or complaints today. 3/28; patient presents for follow-up. He has been using Vashe wet-to-dry dressings. He has no issues or complaints today. He has been using his peg assist with surgical shoe. 4/11; patient presents for follow-up. He has been using Vashe wet-to-dry dressings. The periwound is macerated. He has been using his peg assist with surgical shoe. He has no issues or complaints today. 4/25; patient presents for follow-up. Has been using Vashe wet-to-dry dressings. He has been using zinc oxide to the periwound. For offloading he has been using his peg assist in surgical shoe. He has no issues or complaints today. 5/9; patient presents for follow-up. He has been using Vashe wet-to-dry dressings. He  has no issues or complaints today. He has a surgical shoe with peg assist for offloading. 5/23; patient presents for follow up. He has been using Vashe to clean the wound bed and collagen and blast X with dressing changes. [place] the [place]. Wound is bigger. 5/31; patient presents for follow-up. He has been using Vashe wet-to-dry dressings. Wound is stable but with healthier granulation tissue. Patient would like to hold off on the total contact cast and try a knee scooter for offloading. 6/13; patient presents for follow-up. He has been using Vashe wet-to-dry dressings. He has been using a knee scooter for offloading however states he felt unstable on this and decided to buy an electric scooter. This arrived in the mail today. He would like to hold off on the left total contact cast. Wound is slightly smaller today. 6/27; patient presents for follow-up. He has been using Vashe wet-to-dry dressings. He is using an Art gallery manager for mobility to help relieve the pressure off the wound bed. Wound is smaller today. He wants to hold off on doing the total contact cast as he is showing signs of improvement with wound healing. 7/11; patient presents for follow-up. He has been using Vashe wet-to-dry dressings and his electric scooter for offloading. Wound is slightly smaller. 7/25; patient presents for follow up. He has been using Vashe wet-to-dry dressings and he reports using his electric scooter for offloading. Wound is larger. 7/30; patient presents for follow-up. He has been using Vashe wet-to-dry dressings. He has taken Augmentin as prescribed without issues. Overall wound has healthier granulation tissue. 8/1; patient presents for follow-up. He presents for obligatory cast change. He tolerated the cast well and has no issues or complaints today. We have been using antibiotic ointment with Hydrofera Blue under the cast. Wound is slightly smaller. 8/8; patient presents for follow-up.  We have been using antibiotic ointment with Hydrofera Blue under the total contact cast. Wound is smaller. He has no issues or complaints today. 8/13; patient presents for follow-up. We have been using antibiotic ointment with Hydrofera Blue under the total contact cast. He comes in today because the top part of  the cast over the foot has sunken as well as on the side. He wanted to assure that there were no issues. 8/22; patient presents for follow-up. He has been using Hydrofera Blue to the wound bed. Previously we have been using a total contact cast. We do not have total contact casts due to manufacturing issues available for patients. He has no issues or complaints. He denies signs of infection. 8/27; patient presents for follow-up. He has been using Vashe wet-to-dry dressings. T contact cast is available for placement today. He was agreeable to otal proceed with this. He denies signs of infection. 9/3; patient presents for follow-up. We have been using Hydrofera Blue with antibiotic ointment under the total contact cast for the past week. He has tolerated this well. The wound is smaller. Unfortunately we do not have a total contact cast in office today for replacement as there is been manufacturing issues. 9/5; patient was seen earlier this week and plan was for a total contact cast but there is none available. The cast finally arrived in patient has come in today to have this placed. We will continue with [facility] and antibiotic ointment under the cast. [name] ([id number]) [id number] Page 7 of 11 Patient History Information obtained from Patient. Family History Heart Disease - Mother,Father, No family history of Cancer, Diabetes, Hereditary Spherocytosis, Hypertension, Kidney Disease, Lung Disease, Seizures, Stroke, Thyroid Problems, Tuberculosis. Social History Never smoker, Marital Status - Single, Alcohol Use - Rarely, Drug Use - No History,  Caffeine Use - Rarely. 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 6 Denies history of Angina, Arrhythmia, Deep Vein Thrombosis, Hypotension, Peripheral Arterial Disease, Peripheral Venous Disease, Phlebitis, Vasculitis Gastrointestinal Denies history of Cirrhosis , Colitis, Crohns, Hepatitis A, Hepatitis B, Hepatitis C Endocrine Patient has history of Type II Diabetes Genitourinary Denies history of End Stage Renal Disease Immunological Denies history of Lupus Erythematosus, Raynauds, Scleroderma Integumentary (Skin) Denies history of History of Burn Musculoskeletal Denies history of Gout, Rheumatoid Arthritis, Osteoarthritis, Osteomyelitis Neurologic Patient has history of Neuropathy Denies history of Dementia, Quadriplegia, Paraplegia, Seizure Disorder Oncologic Denies history of Received Chemotherapy, Received Radiation Psychiatric Denies history of Anorexia/bulimia, Confinement Anxiety Hospitalization/Surgery History - Heart Cath in 2018. Medical A Surgical History Notes nd Cardiovascular Ischemic Cardiomyopathy Genitourinary Renal Insufficiency Objective Constitutional respirations regular, non-labored and within target range for patient.. Vitals Time Taken: 9:14 AM, Height: 78 in, Weight: 275 lbs, BMI: 31.8, Temperature: 97.9 F, Pulse: 81 bpm, Respiratory Rate: 16 breaths/min, Blood Pressure: 108/78 mmHg. Cardiovascular 2+ dorsalis  pedis/posterior tibialis pulses. Psychiatric pleasant and cooperative. General Notes: Left foot: T the lateral aspect fifth met head there is an open wound with granulation tissue. No erythema or increased warmth to the o periwound. Integumentary (Hair, Skin) Wound #3 status is Open. Original cause of wound was Gradually Appeared. The date acquired was: 10/01/2022. The wound has been in treatment 31 weeks. The wound is located on the Left,Plantar Foot. The wound measures 0.9cm length x 0.4cm width x 0.2cm depth; 0.283cm^2 area and 0.057cm^3 volume. There is Fat Layer (Subcutaneous Tissue) exposed. There is no tunneling or undermining noted. There is a medium amount of  serosanguineous drainage noted. The wound margin is thickened. There is large (67-100%) red, pink granulation within the wound bed. There is no necrotic tissue within the wound bed. The periwound skin appearance exhibited: Callus. The periwound skin appearance did not exhibit: Crepitus, Excoriation, Induration, Rash, Scarring, Dry/Scaly, Maceration, Atrophie Blanche, Cyanosis, Ecchymosis, Hemosiderin Staining, Mottled, Pallor, Rubor, Erythema. Periwound temperature was noted as No Abnormality. 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 Wound appears well-healing. I recommended continuing with antibiotic ointment and Hydrofera Blue to the wound bed. T contact cast was placed in standard fashion. otal Procedures Wound #3 Pre-procedure diagnosis of Wound #3 is a Diabetic Wound/Ulcer of the Lower Extremity located on the Left,Plantar Foot . There was a T Research scientist (life sciences) otal Procedure by Geralyn Corwin, DO. Post procedure  Diagnosis Wound #3: Same as Pre-Procedure Plan Follow-up Appointments: Return Appointment in 1 week. - Dr Mikey Bussing Room 8 0930 09/11/2023 Return Appointment in 2 weeks. - switch to Dr. Lady Gary 09/17/2023 0745 Return appointment in 3 weeks. - Dr. Lady Gary please scheduled for patient. Anesthetic: (In clinic) Topical Lidocaine 4% applied to wound bed Cellular or Tissue Based Products: Cellular or Tissue Based Product Type: - apligraf and grafix 50% coinsurance 02/28/23-apply 1st Grafix 03/07/2023 HOLD THIS WEEK. Bathing/ Shower/ Hygiene: May shower with protection but do not get wound dressing(s) wet. Protect dressing(s) with water repellant cover (for example, large plastic bag) or a cast cover and may then take shower. Off-Loading: T Contact Cast to Left Lower Extremity - size 3 heel cup to toes. foam pad to anterior ankle- applied 08/26/2023 otal WOUND #3: - Foot Wound Laterality: Plantar, Left Cleanser: Soap and Water 1 x Per Day/30 Days Discharge Instructions: May shower and wash wound with dial antibacterial soap and water prior to dressing change. Cleanser: Vashe 5.8 (oz) 1 x Per Day/30 Days Discharge Instructions: Cleanse the wound with Vashe prior to applying a clean dressing using gauze sponges, not tissue or cotton balls. Topical: Gentamicin 1 x Per Day/30 Days Discharge Instructions: As directed by physician Topical: Mupirocin Ointment 1 x Per Day/30 Days Discharge Instructions: Apply Mupirocin (Bactroban) as instructed Prim Dressing: Hydrofera Blue Ready Transfer Foam, 2.5x2.5 (in/in) 1 x Per Day/30 Days ary Discharge Instructions: Apply directly to wound bed as directed Secondary Dressing: ABD Pad, 5x9 1 x Per Day/30 Days Secured With: Kerlix Roll Sterile, 4.5x3.1 (in/yd) 1 x Per Day/30 Days Discharge Instructions: Secure with Kerlix as directed. Secured With: 52M Medipore H Soft Cloth Surgical T ape, 4 x 10 (in/yd) 1 x Per Day/30 Days Discharge Instructions: Secure with tape as  directed. 1. Hydrofera Blue and antibiotic ointment under total contact cast 2. Follow-up in 1 week Electronic Signature(s) Signed: 09/04/2023 5:29:38 PM By: Geralyn Corwin DO Entered By: Geralyn Corwin on 09/04/2023 07:14:02 HxROS Details -------------------------------------------------------------------------------- [name] ([id number]) [id number] Page 9 of 11 Patient Name: Date of Service: [name] [place], [name] RO LD 09/04/2023 9:15 A M Medical Record Number: [id number] Patient Account Number: [id number] Date of Birth/Sex: Treating RN: 04/02/1971 (52 y.o. M) Primary Care Provider: [name] Other Clinician: Referring Provider: Treating Provider/Extender: [name] in Treatment: 31 Information Obtained From Patient Eyes Medical History: Negative for: Cataracts; Glaucoma; Optic Neuritis Ear/Nose/Mouth/Throat Medical History: Positive for: Chronic  sinus problems/congestion Negative for: Middle ear problems Hematologic/Lymphatic Medical History: Negative for: Anemia; Hemophilia; Human Immunodeficiency Virus; Lymphedema; Sickle Cell Disease Respiratory Medical History: Negative for: Aspiration; Asthma; Chronic Obstructive Pulmonary Disease (COPD); Pneumothorax; Sleep Apnea; Tuberculosis Cardiovascular Medical History: Positive for: Congestive Heart Failure; Coronary Artery Disease; Hypertension; Myocardial Infarction - age 20 Negative for: Angina; Arrhythmia; Deep Vein Thrombosis; Hypotension; Peripheral Arterial Disease; Peripheral Venous Disease; Phlebitis; Vasculitis Past Medical History Notes: Ischemic Cardiomyopathy Gastrointestinal Medical History: Negative for: Cirrhosis ; Colitis; Crohns; Hepatitis A; Hepatitis B; Hepatitis C Endocrine Medical History: Positive for: Type II Diabetes Time with diabetes: ten years Treated with: Insulin Blood sugar tested every day: No Genitourinary Medical  History: Negative for: End Stage Renal Disease Past Medical History Notes: Renal Insufficiency Immunological Medical History: Negative for: Lupus Erythematosus; Raynauds; Scleroderma Integumentary (Skin) Medical History: Negative for: History of Burn Musculoskeletal Medical History: Negative for: Gout; Rheumatoid Arthritis; Osteoarthritis; Osteomyelitis Neurologic Medical History: Positive for: Neuropathy Negative for: Dementia; Quadriplegia; Paraplegia; Seizure Disorder [name], [name] ([id number]) [id number] Page 10 of 11 Oncologic Medical History: Negative for: Received Chemotherapy; Received Radiation Psychiatric Medical History: Negative for: Anorexia/bulimia; Confinement Anxiety HBO Extended History Items Ear/Nose/Mouth/Throat: Chronic sinus problems/congestion Immunizations Pneumococcal Vaccine: Received Pneumococcal Vaccination: No Implantable Devices None Hospitalization / Surgery History Type of Hospitalization/Surgery Heart Cath in 2018 Family and Social History Cancer: No; Diabetes: No; Heart Disease: Yes - Mother,Father; Hereditary Spherocytosis: No; Hypertension: No; Kidney Disease: No; Lung Disease: No; Seizures: No; Stroke: No; Thyroid Problems: No; Tuberculosis: No; Never smoker; Marital Status - Single; Alcohol Use: Rarely; Drug Use: No History; Caffeine Use: Rarely; Financial Concerns: No; Food, Clothing or Shelter Needs: No; Support System Lacking: No; Transportation Concerns: No Electronic Signature(s) Signed: 09/04/2023 5:29:38 PM By: Geralyn Corwin DO Entered By: Geralyn Corwin on 09/04/2023 07:11:45 -------------------------------------------------------------------------------- Total Contact Cast Details Patient Name: Date of Service: [name] [place], [name] 09/04/2023 9:15 A M Medical Record Number: [id number] Patient Account Number: [id number] Date of Birth/Sex: Treating RN: 1971-04-30 (52 y.o. [name] Primary Care Provider: [name] Other Clinician: Referring Provider: Treating Provider/Extender: [name] in Treatment: 31 T Contact Cast Applied for Wound Assessment: otal Wound #3 Left,Plantar Foot Performed By: Physician Geralyn Corwin, DO Post Procedure Diagnosis Same as Pre-procedure Electronic Signature(s) Signed: 09/04/2023 5:00:34 PM By: Shawn Stall RN, BSN Signed: 09/04/2023 5:29:38 PM By: Geralyn Corwin DO Entered By: Shawn Stall on 09/04/2023 06:42:52 SuperBill Details -------------------------------------------------------------------------------- [name] ([id number]) [id number] Page 11 of 11 Patient Name: Date of Service: [name] [place], [name] RO LD 09/04/2023 Medical Record Number: [id number] Patient Account Number: [id number] Date of Birth/Sex: Treating RN: 11-Jun-1971 (52 y.o. [name] Primary Care Provider: [name] Other Clinician: Referring Provider: Treating Provider/Extender: [name] in Treatment: 31 Diagnosis Coding ICD-10 Codes Code Description [phone] 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.621 Type 2 diabetes mellitus with foot ulcer Z89.422 Acquired absence of other left toe(s) Facility Procedures CPT4 Code Description Modifier Quantity [id number] [phone] - APPLY TOTAL CONTACT LEG CAST 1 ICD-10 Diagnosis Description E11.621 Type 2 diabetes mellitus with foot ulcer L97.522 Non-pressure chronic ulcer of other part of left foot with fat layer exposed Electronic Signature(s) Signed: 09/04/2023 5:29:38 PM By: Geralyn Corwin DO Entered By: Geralyn Corwin on 09/04/2023 07:14:28

## 2023-09-09 ENCOUNTER — Ambulatory Visit (HOSPITAL_BASED_OUTPATIENT_CLINIC_OR_DEPARTMENT_OTHER): Payer: 59 | Admitting: Internal Medicine

## 2023-09-09 ENCOUNTER — Other Ambulatory Visit: Payer: Self-pay

## 2023-09-10 NOTE — Progress Notes (Signed)
DANELL, SISK ([id number]) [id number] Page 1 of 7 Visit Report for 09/02/2023 Arrival Information Details Patient Name: Date of Service: Bruce Donath [place], [name] RO LD 09/02/2023 8:00 A M Medical Record Number: [id number] Patient Account Number: [id number] Date of Birth/Sex: Treating RN: 1970-12-31 (52 y.o. Yates Decamp Primary Care Arine Foley: [name] Other Clinician: Referring Malakie Balis: Treating Braxxton Stoudt/Extender: [name] in Treatment: 31 Visit Information History Since Last Visit All ordered tests and consults were completed: Yes Patient Arrived: Ambulatory Added or deleted any medications: No Arrival Time: 07:55 Any new allergies or adverse reactions: No Accompanied By: self Had a fall or experienced change in No Transfer Assistance: None activities of daily living that may affect Patient Requires Transmission-Based Precautions: No risk of falls: Patient Has Alerts: Yes Signs or symptoms of abuse/neglect since last visito No Patient Alerts: 08/2021 ABI:1.2 TBI 0.79 Hospitalized since last visit: No Implantable device outside of the clinic excluding No cellular tissue based products placed in the center since last visit: Has Dressing in Place as Prescribed: Yes Pain Present Now: Yes Electronic Signature(s) Signed: 09/10/2023 7:59:39 AM By: Brenton Grills Entered By: Brenton Grills on 09/02/2023 07:56:21 -------------------------------------------------------------------------------- Encounter Discharge Information Details Patient Name: Date of Service: Bruce Donath [place], [name] 09/02/2023 8:00 A M Medical Record Number: [id number] Patient Account Number: [id number] Date of Birth/Sex: Treating RN: 1971-12-24 (52 y.o. Yates Decamp Primary Care Shatona Andujar: [name] Other Clinician: Referring Jemiah Cuadra: Treating Alessio Bogan/Extender: [name] in Treatment: 31 Encounter Discharge  Information Items Post Procedure Vitals Discharge Condition: Stable Temperature (F): 98.9 Ambulatory Status: Ambulatory Pulse (bpm): 82 Discharge Destination: Home Respiratory Rate (breaths/min): 18 Transportation: Private Auto Blood Pressure (mmHg): 126/74 Accompanied By: self Schedule Follow-up Appointment: Yes Clinical Summary of Care: Patient Declined Electronic Signature(s) Signed: 09/10/2023 7:59:39 AM By: Brenton Grills Entered By: Brenton Grills on 09/02/2023 09:22:46 [name] ([id number]) [id number] Page 2 of 7 -------------------------------------------------------------------------------- Lower Extremity Assessment Details Patient Name: Date of Service: Bruce Donath [place], [name] RO LD 09/02/2023 8:00 A M Medical Record Number: [id number] Patient Account Number: [id number] Date of Birth/Sex: Treating RN: 1971-01-18 (52 y.o. Yates Decamp Primary Care Mariely Mahr: [name] Other Clinician: Referring Arieanna Pressey: Treating Marisal Swarey/Extender: [name] in Treatment: 31 Edema Assessment Assessed: [name]: No] [name]: No] Edema: [Left: N] [Right: o] Calf Left: Right: Point of Measurement: 43 cm From Medial Instep 37.2 cm Ankle Left: Right: Point of Measurement: 9 cm From Medial Instep 21 cm Vascular Assessment Pulses: Dorsalis Pedis Palpable: [Left:Yes] Extremity colors, hair growth, and conditions: Extremity Color: [Left:Normal] Hair Growth on Extremity: [Left:Yes] Temperature of Extremity: [Left:Warm] Capillary Refill: [Left:< 3 seconds] Dependent Rubor: [Left:No No] Toe Nail Assessment Left: Right: Thick: Yes Discolored: Yes Deformed: Yes Improper Length and Hygiene: Yes Electronic Signature(s) Signed: 09/10/2023 7:59:39 AM By: Brenton Grills Entered By: Brenton Grills on 09/02/2023 08:11:00 -------------------------------------------------------------------------------- Multi Wound Chart  Details Patient Name: Date of Service: Bruce Donath [place], [name] 09/02/2023 8:00 A M Medical Record Number: [id number] Patient Account Number: [id number] Date of Birth/Sex: Treating RN: May 25, 1971 (52 y.o. M) Primary Care Rovena Hearld: [name] Other Clinician: Referring Brees Hounshell: Treating Teylor Wolven/Extender: [name] in Treatment: 31 Vital Signs Height(in): 78 Pulse(bpm): 82 Weight(lbs): 275 Blood Pressure(mmHg): 107/75 Body Mass Index(BMI): 31.8 Coaling, [name] ([id number]) [id number] Page 3 of 7 Temperature(F): 97.8 Respiratory Rate(breaths/min): 18 [3:Photos:] [N/A:N/A] Left, Plantar Foot N/A N/A Wound Location: Gradually Appeared N/A N/A Wounding Event: Diabetic Wound/Ulcer of the Lower N/A N/A Primary Etiology:  Extremity Chronic sinus problems/congestion, N/A N/A Comorbid History: Congestive Heart Failure, Coronary Artery Disease, Hypertension, Myocardial Infarction, Type II Diabetes, Neuropathy 10/01/2022 N/A N/A Date Acquired: 37 N/A N/A Weeks of Treatment: Open N/A N/A Wound Status: No N/A N/A Wound Recurrence: Yes N/A N/A Pending [name] on Presentation: 0.9x0.4x0.1 N/A N/A Measurements L x W x D (cm) 0.283 N/A N/A A (cm) : rea 0.028 N/A N/A Volume (cm) : 92.80% N/A N/A % Reduction in A rea: 96.40% N/A N/A % Reduction in Volume: Grade 1 N/A N/A Classification: Medium N/A N/A Exudate A mount: Serosanguineous N/A N/A Exudate Type: red, brown N/A N/A Exudate Color: Thickened N/A N/A Wound Margin: Large (67-100%) N/A N/A Granulation A mount: Red, Pink N/A N/A Granulation Quality: None Present (0%) N/A N/A Necrotic A mount: Fat Layer (Subcutaneous Tissue): Yes N/A N/A Exposed Structures: Fascia: No Tendon: No Muscle: No Joint: No Bone: No Medium (34-66%) N/A N/A Epithelialization: Debridement - Excisional N/A N/A Debridement: Pre-procedure Verification/Time Out 08:49 N/A  N/A Taken: Lidocaine 4% Topical Solution N/A N/A Pain Control: Callus, Subcutaneous, Slough N/A N/A Tissue Debrided: Skin/Subcutaneous Tissue N/A N/A Level: 0.28 N/A N/A Debridement A (sq cm): rea Curette N/A N/A Instrument: Minimum N/A N/A Bleeding: Pressure N/A N/A Hemostasis A chieved: 0 N/A N/A Procedural Pain: 0 N/A N/A Post Procedural Pain: Procedure was tolerated well N/A N/A Debridement Treatment Response: 0.9x0.4x0.1 N/A N/A Post Debridement Measurements L x W x D (cm) 0.028 N/A N/A Post Debridement Volume: (cm) Callus: Yes N/A N/A Periwound Skin Texture: Excoriation: No Induration: No Crepitus: No Rash: No Scarring: No Maceration: No N/A N/A Periwound Skin Moisture: Dry/Scaly: No Atrophie Blanche: No N/A N/A Periwound Skin Color: Cyanosis: No Ecchymosis: No Erythema: No Hemosiderin Staining: No Mottled: No Pallor: No Rubor: No No Abnormality N/A N/A Temperature: Debridement N/A N/A Procedures Performed: ELIZANDRO, MACISAAC ([id number]) [id number] Page 4 of 7 Treatment Notes Wound #3 (Foot) Wound Laterality: Plantar, Left Cleanser Soap and Water Discharge Instruction: May shower and wash wound with dial antibacterial soap and water prior to dressing change. Vashe 5.8 (oz) Discharge Instruction: Cleanse the wound with Vashe prior to applying a clean dressing using gauze sponges, not tissue or cotton balls. Peri-Wound Care Topical Primary Dressing Vashe wet to dry Discharge Instruction: vashe moisten gauze to wound bed. 2x2 gauze Discharge Instruction: moisten with vashe. Secondary Dressing ABD Pad, 5x9 Secured With [employer], 4.5x3.1 (in/yd) Discharge Instruction: Secure with Kerlix as directed. 8M Medipore H Soft Cloth Surgical T ape, 4 x 10 (in/yd) Discharge Instruction: Secure with tape as directed. Compression Wrap Compression Stockings Add-Ons Electronic Signature(s) Signed: 09/02/2023 2:04:51 PM  By: Geralyn Corwin DO Entered By: Geralyn Corwin on 09/02/2023 09:59:44 -------------------------------------------------------------------------------- Multi-Disciplinary Care Plan Details Patient Name: Date of Service: Bruce [place]ath [place], [name] 09/02/2023 8:00 A M Medical Record Number: [id number] Patient Account Number: [id number] Date of Birth/Sex: Treating RN: 1971/05/06 (52 y.o. Yates Decamp Primary Care Najma Bozarth: [name] Other Clinician: Referring Makhiya Coburn: Treating Candace Begue/Extender: [name] in Treatment: 31 Active Inactive Wound/Skin Impairment Nursing Diagnoses: Impaired tissue integrity Knowledge deficit related to ulceration/compromised skin integrity Goals: Patient will have a decrease in wound volume by X% from date: (specify in notes) Date Initiated: 01/24/2023 Target Resolution Date: 08/30/2023 Goal Status: Active Patient/caregiver will verbalize understanding of skin care regimen Date Initiated: 01/24/2023 Target Resolution Date: 08/30/2023 Goal Status: Active Ulcer/skin breakdown will have a volume reduction of 30% by week 4 [place], [name] ([id number]) [phone].pdf Page 5 of 7 Date  Initiated: 01/24/2023 Date Inactivated: 03/07/2023 Target Resolution Date: 02/27/2023 Goal Status: Unmet Unmet Reason: larger today. Ulcer/skin breakdown will have a volume reduction of 50% by week 8 Date Initiated: 01/24/2023 Target Resolution Date: 08/30/2023 Goal Status: Active Interventions: Assess patient/caregiver ability to obtain necessary supplies Assess patient/caregiver ability to perform ulcer/skin care regimen upon admission and as needed Assess ulceration(s) every visit Notes: Electronic Signature(s) Signed: 09/10/2023 7:59:39 AM By: Brenton Grills Entered By: Brenton Grills on 09/02/2023 08:16:34 -------------------------------------------------------------------------------- Pain Assessment  Details Patient Name: Date of Service: Bruce [place], [name] 09/02/2023 8:00 A M Medical Record Number: [id number] Patient Account Number: [id number] Date of Birth/Sex: Treating RN: 1971-08-03 (52 y.o. Yates Decamp Primary Care Loa Idler: [name] Other Clinician: Referring Renu Asby: Treating Jozlyn Schatz/Extender: [name] in Treatment: 31 Active Problems Location of Pain Severity and Description of Pain Patient Has Paino Yes Site Locations Pain Location: Generalized Pain Pain Management and Medication Current Pain Management: Electronic Signature(s) Signed: 09/10/2023 7:59:39 AM By: Brenton Grills Entered By: Brenton Grills on 09/02/2023 07:58:54 Patient/Caregiver Education Details -------------------------------------------------------------------------------- [name] ([id number]) [id number] Page 6 of 7 Patient Name: Date of Service: Bruce Donath [place], [name] RO LD 9/3/2024andnbsp8:00 A M Medical Record Number: [id number] Patient Account Number: [id number] Date of Birth/Gender: Treating RN: Sep 27, 1971 (52 y.o. Yates Decamp Primary Care Physician: [name] Other Clinician: Referring Physician: Treating Physician/Extender: [name] in Treatment: 31 Education Assessment Education Provided To: Patient Education Topics Provided Wound/Skin Impairment: Methods: Explain/Verbal Responses: State content correctly [employer]) Signed: 09/10/2023 7:59:39 AM By: Brenton Grills Entered By: Brenton Grills on 09/02/2023 08:16:52 -------------------------------------------------------------------------------- Wound Assessment Details Patient Name: Date of Service: Bruce [place]ath [place], [name] 09/02/2023 8:00 A M Medical Record Number: [id number] Patient Account Number: [id number] Date of Birth/Sex: Treating RN: 29-Jun-1971 (52 y.o. Yates Decamp Primary Care Brentton Wardlow: [name] Other Clinician: Referring Sephira Zellman: Treating Keshonna Valvo/Extender: [name] in Treatment: 31 Wound Status Wound Number: 3 Primary Diabetic Wound/Ulcer of the Lower Extremity Etiology: Wound Location: Left, Plantar Foot Wound Open Wounding Event: Gradually Appeared Status: Date Acquired: 10/01/2022 Comorbid Chronic sinus problems/congestion, Congestive Heart Failure, Weeks Of Treatment: 31 History: Coronary Artery Disease, Hypertension, Myocardial Infarction, Type Clustered Wound: No II Diabetes, Neuropathy Pending Amputation On Presentation Wound under treatment by Kynisha Memon outside of Wound Center Photos Wound Measurements Length: (cm) 0.9 Width: (cm) 0.4 Depth: (cm) 0.1 Area: (cm) 0.283 Volume: (cm) 0.028 % Reduction in Area: 92.8% % Reduction in Volume: 96.4% Epithelialization: Medium (34-66%) Tunneling: No Undermining: No Wound Description TYMIRE, BIVINS ([id number]) Classification: Grade 1 Wound Margin: Thickened Exudate Amount: Medium Exudate Type: Serosanguineous Exudate Color: red, brown [id number] Page 7 of 7 Foul Odor After Cleansing: No Slough/Fibrino No Wound Bed Granulation Amount: Large (67-100%) Exposed Structure Granulation Quality: Red, Pink Fascia Exposed: No Necrotic Amount: None Present (0%) Fat Layer (Subcutaneous Tissue) Exposed: Yes Tendon Exposed: No Muscle Exposed: No Joint Exposed: No Bone Exposed: No Periwound Skin Texture Texture Color No Abnormalities Noted: No No Abnormalities Noted: No Callus: Yes Atrophie Blanche: No Crepitus: No Cyanosis: No Excoriation: No Ecchymosis: No Induration: No Erythema: No Rash: No Hemosiderin Staining: No Scarring: No Mottled: No Pallor: No Moisture Rubor: No No Abnormalities Noted: No Dry / Scaly: No Temperature / Pain Maceration: No Temperature: No Abnormality Electronic Signature(s) Signed: 09/10/2023 7:59:39 AM By:  Brenton Grills Entered By: Brenton Grills on 09/02/2023 08:13:54 -------------------------------------------------------------------------------- Vitals Details Patient Name: Date of Service: Bruce Donath [place], [name] 09/02/2023  8:00 A M Medical Record Number: [id number] Patient Account Number: [id number] Date of Birth/Sex: Treating RN: September 04, 1971 (52 y.o. Yates Decamp Primary Care Trenia [name]: [name] Other Clinician: Referring Caralina Nop: Treating Mora Pedraza/Extender: [name] in Treatment: 31 Vital Signs Time Taken: 07:56 Temperature (F): 97.8 Height (in): 78 Pulse (bpm): 82 Weight (lbs): 275 Respiratory Rate (breaths/min): 18 Body Mass Index (BMI): 31.8 Blood Pressure (mmHg): 107/75 Reference Range: 80 - 120 mg / dl Electronic Signature(s) Signed: 09/10/2023 7:59:39 AM By: Brenton Grills Entered By: Brenton Grills on 09/02/2023 07:58:41

## 2023-09-10 NOTE — Progress Notes (Signed)
NAFI, HANBY ([id number]) [id number] Page 1 of 11 Visit Report for 09/02/2023 Chief Complaint Document Details Patient Name: Date of Service: [name] [place], [name] RO LD 09/02/2023 8:00 A M Medical Record Number: [id number] Patient Account Number: [id number] Date of Birth/Sex: Treating RN: 03-24-1971 (52 y.o. M) Primary Care Provider: [name] Other Clinician: Referring Provider: Treating Provider/Extender: [name] in Treatment: 31 Information Obtained from: Patient Chief Complaint 01/15/2023; left foot wound Electronic Signature(s) Signed: 09/02/2023 2:04:51 PM By: Geralyn Corwin DO Entered By: Geralyn Corwin on 09/02/2023 10:00:25 -------------------------------------------------------------------------------- Debridement Details Patient Name: Date of Service: [name], [name] 09/02/2023 8:00 A M Medical Record Number: [id number] Patient Account Number: [id number] Date of Birth/Sex: Treating RN: 06-28-1971 (52 y.o. Yates Decamp Primary Care Provider: [name] Other Clinician: Referring Provider: Treating Provider/Extender: [name] in Treatment: 31 Debridement Performed for Assessment: Wound #3 Left,Plantar Foot Performed By: Physician Geralyn Corwin, DO Debridement Type: Debridement Severity of Tissue Pre Debridement: Fat layer exposed Level of Consciousness (Pre-procedure): Awake and Alert Pre-procedure Verification/Time Out Yes - 08:49 Taken: Start Time: 08:50 Pain Control: Lidocaine 4% T opical Solution Percent of Wound Bed Debrided: 100% T Area Debrided (cm): otal 0.28 Tissue and other material debrided: Viable, Non-Viable, Callus, Slough, Subcutaneous, Slough Level: Skin/Subcutaneous Tissue Debridement Description: Excisional Instrument: Curette Bleeding: Minimum Hemostasis Achieved: Pressure Procedural Pain: 0 Post Procedural Pain: 0 Response to  Treatment: Procedure was tolerated well Level of Consciousness (Post- Awake and Alert procedure): Post Debridement Measurements of Total Wound Length: (cm) 0.9 Width: (cm) 0.4 Depth: (cm) 0.1 Volume: (cm) 0.028 Character of Wound/Ulcer Post Debridement: Improved Severity of Tissue Post Debridement: Fat layer exposed [name] ([id number]) [id number] Page 2 of 11 Post Procedure Diagnosis Same as Pre-procedure Notes Scribed for Dr Mikey Bussing by Brenton Grills RN Electronic Signature(s) Signed: 09/02/2023 2:04:51 PM By: Geralyn Corwin DO Signed: 09/10/2023 7:59:39 AM By: Brenton Grills Entered By: Brenton Grills on 09/02/2023 08:51:07 -------------------------------------------------------------------------------- HPI Details Patient Name: Date of Service: [name] [place], [name] 09/02/2023 8:00 A M Medical Record Number: [id number] Patient Account Number: [id number] Date of Birth/Sex: Treating RN: May 23, 1971 (52 y.o. M) Primary Care Provider: [name] Other Clinician: Referring Provider: Treating Provider/Extender: [name] in Treatment: 31 History of Present Illness HPI Description: ADMISSION 01/11/2020 This is a 52 year old man with uncontrolled type 2 diabetes with a recent hemoglobin A1c earlier this year of 13.4. He is on insulin and glipizide. He does not take his blood sugars at home. He does have a follow-up with primary care later this month I believe on January 27. He tells [place] that roughly a month ago he was walking with a shoe with a hole in his foot. He took the shoe off and there was an open wound at roughly the left fourth met head. This has significant undermining and raised edges. He has not noticed any purulence he does not feel unwell. More recently he was taking skin off the bottom of his foot and has a superficial area on the left fifth met head. He has not been offloading this. The patient was in the  ER on 12/20. They gave him Bactroban which she has been using on the wound and 10 days worth of doxycycline. No x-rays were done. He has not had vascular studies. He is also been using hydrogen peroxide. Past medical history type 2 diabetes uncontrolled, chronic systolic heart failure, coronary artery disease with a history of congestive heart failure  with stents. Hypertension hyperlipidemia and chronic renal insufficiency ABI in this clinic was 1.14 on the left. 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 He is uncertain whether he would be able to work if we put him on some form of restriction 1/19; he is generally doing quite well. Using silver alginate on the wounds. Things actually look better. He has a forefoot offloading boot which she seems to be compliant about. He has support at work to stay off his foot is much as possible which is gratifying. Culture I did last week showed a few Enterococcus faecalis. I am going to put him on Augmentin. I talked about ordering an x-ray in my note last week but that does not seem to have happened. We will review reorder the x-ray this week. 1/26; x-ray reordered last week was negative for osteomyelitis. We are using silver alginate on the wound on the third and fifth met heads on the left. He is using a Darco forefoot off loader 2/2; the area on the fifth met head is closed. Third met head is still open with tunneling depth and thick callus. 2/9; the area on the fifth met head remains closed however the third met head again has a small open area on presentation with tunneling in depth and surrounded by thick callus. This looks like a pressure issue. We have been using silver alginate 2/16; the area of the fifth metatarsal head remains closed however the area over the third metatarsal head again is a small open area but with some depth. I do not think this is changed much since last week. He is using Hydrofera Blue with forefoot  off loader. He is not able to use a total contact cast on the left leg because he needs his left leg at work [employer]]. Fortunately the wound does not look infected. I changed him to endoform today 2/26; the area of the fifth metatarsal head remains closed. The area of the third metatarsal head has an even smaller opening this time. I used endoform on this this looks improved. He is offloading this is much as he can and a forefoot off loader on the left. He cannot have a total contact cast because of work responsibilities 3/5; the area on the fifth metatarsal head remains closed the area on the third metatarsal head is also closed on the left foot. 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 He has been following for podiatry for several months for this issue. He is currently using wet-to-dry saline dressings. He currently denies signs of infection. Progresse note from 1/17; Patient presents with a 60-month history of nonhealing ulcer to the left foot secondary to diabetes and inability to offload well the area. He has had multiple debridements in the past to his feet bilaterally. He has had resection of the fourth left met head in the fifth toe Secondary to osteomyelitis. We discussed the importance of glycemic control for wound healing. Due to his blood glucose levels being elevated he is at high risk for infection and thus further amputation. He expressed understanding. He states he is supposed to be referred to an endocrinologist at [facility]  however the referral fell through. Unclear what happened. Offered a referral to endocrinology at  [facility]. Patient was agreeable.Furthermore we discussed the importance of aggressive offloading for his wound healing. This will be the most difficult part of the treatment plan for the patient to do. We discussed a total contact cast however he has declined that at this time. 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 He is currently using a surgical shoe with a peg assist. It does not fit well so we will give him a new one today. I do not think this is enough offloading. If he is not able to offload this area he will likely end up with a BKA. He is well aware of this. For now I recommended Medihoney and [facility] for dressing ADREAN, MOLZAHN ([id number]) [id number] Page 3 of 11 changes. He will follow-up in our [place] office Since this is closer for him. 1/26; patient presents for follow-up. Last clinic visit I had seen him in [place] and we transferred him to [place] since this is a closer location for him. Progress note above from that visit. Over the past week He has been using Medihoney and Hydrofera Blue to the wound bed. He has been using his surgical shoe with peg assist. He has no issues or complaints today. We discussed doing the total contact cast and he was agreeable to having this placed at next clinic visit. 2/2; patient presents for follow-up. He has been using Vashe wet-to-dry dressings. Plan is for the total contact cast today. 2/5; patient presents for follow-up. The cast was placed 3 days ago. He tolerated this well however had a lot of drainage. He currently denies signs of infection. 2/9; patient presents for follow-up. At last clinic visit we held off on replacing the cast due to drainage. He has been using Vashe wet-to-dry dressings and he is currently taking the oral antibiotics prescribed without issues. He declines a total contact cast today. We ran insurance  verification for skin substitute and due to cost patient declines having this placed. 2/16; patient presents for follow-up. He has decided not to follow-up with podiatry. He continues to decline the total contact cast. He has been using Vashe wet- to-dry dressings. He has not been wearing his surgical shoe with peg assist. He currently denies signs of infection. 3/1; patient presents for follow-up. He has been doing Vashe wet-to-dry dressings. He has a surgical shoe with peg assist. We discussed potentially doing a skin substitute for which she has been approved for by insurance. He knows the out-of-pocket cost of this and would still like to proceed with having this placed today. He denies the current total contact cast. 3/8; patient presents for follow-up. He states that the skin substitute came off after a few days and he has been using Vashe wet-to-dry dressings. He has mild odor to the wound bed on exam. 3/15; patient presents for follow-up. He has been using Vashe wet-to-dry dressings and started his course of Augmentin. He reports improvement in wound healing. He has no issues or complaints today. 3/21; patient presents for follow-up. He continues to use Vashe wet-to-dry dressings. He has no issues or complaints today. 3/28; patient presents for follow-up. He has been using Vashe wet-to-dry dressings. He has no issues or complaints today. He has been using his peg assist with surgical shoe. 4/11; patient presents for follow-up. He has been using  Vashe wet-to-dry dressings. The periwound is macerated. He has been using his peg assist with surgical shoe. He has no issues or complaints today. 4/25; patient presents for follow-up. Has been using Vashe wet-to-dry dressings. He has been using zinc oxide to the periwound. For offloading he has been using his peg assist in surgical shoe. He has no issues or complaints today. 5/9; patient presents for follow-up. He has been using Vashe wet-to-dry  dressings. He has no issues or complaints today. He has a surgical shoe with peg assist for offloading. 5/23; patient presents for follow up. He has been using Vashe to clean the wound bed and collagen and blast X with dressing changes. [place] the beach [place] 2 [place]. Wound is bigger. 5/31; patient presents for follow-up. He has been using Vashe wet-to-dry dressings. Wound is stable but with healthier granulation tissue. Patient would like to hold off on the total contact cast and try a knee scooter for offloading. 6/13; patient presents for follow-up. He has been using Vashe wet-to-dry dressings. He has been using a knee scooter for offloading however states he felt unstable on this and decided to buy an electric scooter. This arrived in the mail today. He would like to hold off on the left total contact cast. Wound is slightly smaller today. 6/27; patient presents for follow-up. He has been using Vashe wet-to-dry dressings. He is using an Art gallery manager for mobility to help relieve the pressure off the wound bed. Wound is smaller today. He wants to hold off on doing the total contact cast as he is showing signs of improvement with wound healing. 7/11; patient presents for follow-up. He has been using Vashe wet-to-dry dressings and his electric scooter for offloading. Wound is slightly smaller. 7/25; patient presents for follow up. He has been using Vashe wet-to-dry dressings and he reports using his electric scooter for offloading. Wound is larger. 7/30; patient presents for follow-up. He has been using Vashe wet-to-dry dressings. He has taken Augmentin as prescribed without issues. Overall wound has healthier granulation tissue. 8/1; patient presents for follow-up. He presents for obligatory cast change. He tolerated the cast well and has no issues or complaints today. We have been using antibiotic ointment with Hydrofera Blue under the cast. Wound is slightly smaller. 8/8; patient presents  for follow-up. We have been using antibiotic ointment with Hydrofera Blue under the total contact cast. Wound is smaller. He has no issues or complaints today. 8/13; patient presents for follow-up. We have been using antibiotic ointment with Hydrofera Blue under the total contact cast. He comes in today because the top part of the cast over the foot has sunken as well as on the side. He wanted to assure that there were no issues. 8/22; patient presents for follow-up. He has been using Hydrofera Blue to the wound bed. Previously we have been using a total contact cast. We do not have total contact casts due to manufacturing issues available for patients. He has no issues or complaints. He denies signs of infection. 8/27; patient presents for follow-up. He has been using Vashe wet-to-dry dressings. T contact cast is available for placement today. He was agreeable to otal proceed with this. He denies signs of infection. 9/3; patient presents for follow-up. We have been using Hydrofera Blue with antibiotic ointment under the total contact cast for the past week. He has tolerated this well. The wound is smaller. Unfortunately we do not have a total contact cast in office today for replacement as there  is been Set designer issues. Electronic Signature(s) Signed: 09/02/2023 2:04:51 PM By: Geralyn Corwin DO Entered By: Geralyn Corwin on 09/02/2023 10:01:50 [name] ([id number]) [id number] Page 4 of 11 -------------------------------------------------------------------------------- Physical Exam Details Patient Name: Date of Service: [name] [place], [name] RO LD 09/02/2023 8:00 A M Medical Record Number: [id number] Patient Account Number: [id number] Date of Birth/Sex: Treating RN: 07-30-1971 (52 y.o. M) Primary Care Provider: [name] Other Clinician: Referring Provider: Treating Provider/Extender: [name] in Treatment:  31 Constitutional respirations regular, non-labored and within target range for patient.. Cardiovascular 2+ dorsalis pedis/posterior tibialis pulses. Psychiatric pleasant and cooperative. Notes Left foot: T the lateral aspect fifth met head there is an open wound with granulation tissue, slough and callus. No erythema or increased warmth to the o periwound. Electronic Signature(s) Signed: 09/02/2023 2:04:51 PM By: Geralyn Corwin DO Entered By: Geralyn Corwin on 09/02/2023 10:02:34 -------------------------------------------------------------------------------- Physician Orders Details Patient Name: Date of Service: [name], [name] 09/02/2023 8:00 A M Medical Record Number: [id number] Patient Account Number: [id number] Date of Birth/Sex: Treating RN: May 29, 1971 (52 y.o. Yates Decamp Primary Care Provider: [name] Other Clinician: Referring Provider: Treating Provider/Extender: [name] in Treatment: 89 Verbal / Phone Orders: No Diagnosis Coding Follow-up Appointments ppointment in 1 week. - Dr Mikey Bussing Room 8 Return A ppointment in 2 weeks. - Dr. Mikey Bussing room 8 (please schedule for patient) Return A Return appointment in 3 weeks. - Dr Mikey Bussing Room 8 (please schedule for patient) Return appointment in 1 month. - Dr .Mikey Bussing Room 8 (please schedule for patient) Anesthetic (In clinic) Topical Lidocaine 4% applied to wound bed Bathing/ Shower/ Hygiene May shower with protection but do not get wound dressing(s) wet. Protect dressing(s) with water repellant cover (for example, large plastic bag) or a cast cover and may then take shower. Off-Loading Total Contact Cast to Left Lower Extremity - size 3 - no cast available on 09/02/23. heel cup to toes. foam pad to anterior ankle- applied 08/26/2023 Open toe surgical shoe to: - with Peg assist. add felt to shoe to aid in offloading. use knee scooter when you need to. HOLD 08/26/23 TCC  #3 applied Wound Treatment Wound #3 - Foot Wound Laterality: Plantar, Left Cleanser: Soap and Water 1 x Per Day/30 Days Discharge Instructions: May shower and wash wound with dial antibacterial soap and water prior to dressing change. VONZELL, SIZEMORE ([id number]) [id number] Page 5 of 11 Cleanser: Vashe 5.8 (oz) 1 x Per Day/30 Days Discharge Instructions: Cleanse the wound with Vashe prior to applying a clean dressing using gauze sponges, not tissue or cotton balls. Prim Dressing: Vashe wet to dry 1 x Per Day/30 Days ary Discharge Instructions: vashe moisten gauze to wound bed. Prim Dressing: 2x2 gauze 1 x Per Day/30 Days ary Discharge Instructions: moisten with vashe. Secondary Dressing: ABD Pad, 5x9 1 x Per Day/30 Days Electronic Signature(s) Signed: 09/02/2023 2:04:51 PM By: Geralyn Corwin DO Entered By: Geralyn Corwin on 09/02/2023 10:03:00 -------------------------------------------------------------------------------- Problem List Details Patient Name: Date of Service: [name], [name] 09/02/2023 8:00 A M Medical Record Number: [id number] Patient Account Number: [id number] Date of Birth/Sex: Treating RN: October 28, 1971 (52 y.o. Yates Decamp Primary Care Provider: [name] Other Clinician: Referring Provider: Treating Provider/Extender: [name] in Treatment: 31 Active Problems ICD-10 Encounter Code Description Active Date MDM Diagnosis [phone] Non-pressure chronic ulcer of other part of left foot with fat layer exposed 01/24/2023 No Yes E11.621 Type 2 diabetes mellitus with  foot ulcer 01/24/2023 No Yes Z89.422 Acquired absence of other left toe(s) 01/24/2023 No Yes Inactive Problems Resolved Problems Electronic Signature(s) Signed: 09/02/2023 2:04:51 PM By: Geralyn Corwin DO Entered By: Geralyn Corwin on 09/02/2023  09:59:33 -------------------------------------------------------------------------------- Progress Note Details Patient Name: Date of Service: [name] [place], [name] 09/02/2023 8:00 A M Medical Record Number: [id number] Patient Account Number: [id number] Date of Birth/Sex: Treating RN: [birth year] (52 y.o. Particia Nearing, [name] ([id number]) [id number] Page 6 of 11 Primary Care Provider: [name] Other Clinician: Referring Provider: Treating Provider/Extender: [name] in Treatment: 31 Subjective Chief Complaint Information obtained from Patient 01/15/2023; left foot wound History of Present Illness (HPI) ADMISSION 01/11/2020 This is a 52 year old man with uncontrolled type 2 diabetes with a recent hemoglobin A1c earlier this year of 13.4. He is on insulin and glipizide. He does not take his blood sugars at home. He does have a follow-up with primary care later this month I believe on January 27. He tells [place] that roughly a month ago he was walking with a shoe with a hole in his foot. He took the shoe off and there was an open wound at roughly the left fourth met head. This has significant undermining and raised edges. He has not noticed any purulence he does not feel unwell. More recently he was taking skin off the bottom of his foot and has a superficial area on the left fifth met head. He has not been offloading this. The patient was in the ER on 12/20. They gave him Bactroban which she has been using on the wound and 10 days worth of doxycycline. No x-rays were done. He has not had vascular studies. He is also been using hydrogen peroxide. Past medical history type 2 diabetes uncontrolled, chronic systolic heart failure, coronary artery disease with a history of congestive heart failure with stents. Hypertension hyperlipidemia and chronic renal insufficiency ABI in this clinic was 1.14 on the left. 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 He is uncertain whether he would be able to work if we put him on some form of restriction 1/19; he is generally doing quite well. Using silver alginate on the wounds. Things actually look better. He has a forefoot offloading boot which she seems to be compliant about. He has support at work to stay off his foot is much as possible which is gratifying. Culture I did last week showed a few Enterococcus faecalis. I am going to put him on Augmentin. I talked about ordering an x-ray in my note last week but that does not seem to have happened. We will review reorder the x-ray this week. 1/26; x-ray reordered last week was negative for osteomyelitis. We are using silver alginate on the wound on the third and fifth met heads on the left. He is using a Darco forefoot off loader 2/2; the area on the fifth met head is closed. Third met head is still open with tunneling depth and thick callus. 2/9; the area on the fifth met head remains closed however the third met head again has a small open area on presentation with tunneling in depth and surrounded by thick callus. This looks like a pressure issue. We have been using silver alginate 2/16; the area of the fifth metatarsal head remains closed however the area over the third metatarsal head again is a small open area but with some depth. I do not think this is changed much since last  week. He is using Hydrofera Blue with forefoot off loader. He is not able to use a total contact cast on the left leg because he needs his left leg at work [employer]]. Fortunately the wound does not look infected. I changed him to endoform today 2/26; the area of the fifth metatarsal head remains closed. The area of the third metatarsal head has an even smaller opening this time. I used endoform on this this looks improved. He is offloading this is much as he can and a forefoot off loader on the left. He cannot have a total contact  cast because of work responsibilities 3/5; the area on the fifth metatarsal head remains closed the area on the third metatarsal head is also closed on the left foot. 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 He has been following for podiatry for several months for this issue. He is currently using wet-to-dry saline dressings. He currently denies signs of infection. Progresse note from 1/17; Patient presents with a 42-month history of nonhealing ulcer to the left foot secondary to diabetes and inability to offload well the area. He has had multiple debridements in the past to his feet bilaterally. He has had resection of the fourth left met head in the fifth toe Secondary to osteomyelitis. We discussed the importance of glycemic control for wound healing. Due to his blood glucose levels being elevated he is at high risk for infection and thus further amputation. He expressed understanding. He states he is supposed to be referred to an endocrinologist at [facility] however the referral fell through. Unclear what happened. Offered a referral to endocrinology at [facility]. Patient was agreeable.Furthermore we discussed the importance of aggressive offloading for his wound healing. This will be the most difficult part of the treatment plan for the patient to do. We discussed a total contact cast however he has declined that at this time. 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 He is currently using a surgical shoe with a peg assist. It does not fit well so we will give him a new  one today. I do not think this is enough offloading. If he is not able to offload this area he will likely end up with a BKA. He is well aware of this. For now I recommended Medihoney and Hydrofera Blue for dressing changes. He will follow-up in our [place] office Since this is closer for him. 1/26; patient presents for follow-up. Last clinic visit I had seen him in [place] and we transferred him to [place] since this is a closer location for him. Progress note above from that visit. Over the past week He has been using Medihoney and Hydrofera Blue to the wound bed. He has been using his surgical shoe with peg assist. He has no issues or complaints today. We discussed doing the total contact cast and he was agreeable to having this placed at next clinic visit. 2/2; patient presents for follow-up. He has been using Vashe wet-to-dry dressings. Plan is for the total contact cast today. 2/5; patient presents for follow-up. The cast was  placed 3 days ago. He tolerated this well however had a lot of drainage. He currently denies signs of infection. 2/9; patient presents for follow-up. At last clinic visit we held off on replacing the cast due to drainage. He has been using Vashe wet-to-dry dressings and he is currently taking the oral antibiotics prescribed without issues. He declines a total contact cast today. We ran insurance verification for skin substitute and due to cost patient declines having this placed. 2/16; patient presents for follow-up. He has decided not to follow-up with podiatry. He continues to decline the total contact cast. He has been using Vashe wet- to-dry dressings. He has not been wearing his surgical shoe with peg assist. He currently denies signs of infection. 3/1; patient presents for follow-up. He has been doing Vashe wet-to-dry dressings. He has a surgical shoe with peg assist. We discussed potentially doing a skin substitute for which she has been approved for by  insurance. He knows the out-of-pocket cost of this and would still like to proceed with having this placed today. He denies the current total contact cast. 3/8; patient presents for follow-up. He states that the skin substitute came off after a few days and he has been using Vashe wet-to-dry dressings. He has mild odor to the wound bed on exam. [name], [name] ([id number]) [id number] Page 7 of 11 3/15; patient presents for follow-up. He has been using Vashe wet-to-dry dressings and started his course of Augmentin. He reports improvement in wound healing. He has no issues or complaints today. 3/21; patient presents for follow-up. He continues to use Vashe wet-to-dry dressings. He has no issues or complaints today. 3/28; patient presents for follow-up. He has been using Vashe wet-to-dry dressings. He has no issues or complaints today. He has been using his peg assist with surgical shoe. 4/11; patient presents for follow-up. He has been using Vashe wet-to-dry dressings. The periwound is macerated. He has been using his peg assist with surgical shoe. He has no issues or complaints today. 4/25; patient presents for follow-up. Has been using Vashe wet-to-dry dressings. He has been using zinc oxide to the periwound. For offloading he has been using his peg assist in surgical shoe. He has no issues or complaints today. 5/9; patient presents for follow-up. He has been using Vashe wet-to-dry dressings. He has no issues or complaints today. He has a surgical shoe with peg assist for offloading. 5/23; patient presents for follow up. He has been using Vashe to clean the wound bed and collagen and blast X with dressing changes. [place] went [place] the beach [place] 2 [place]. Wound is bigger. 5/31; patient presents for follow-up. He has been using Vashe wet-to-dry dressings. Wound is stable but with healthier granulation tissue. Patient would like to hold off on the total contact cast and try a  knee scooter for offloading. 6/13; patient presents for follow-up. He has been using Vashe wet-to-dry dressings. He has been using a knee scooter for offloading however states he felt unstable on this and decided to buy an electric scooter. This arrived in the mail today. He would like to hold off on the left total contact cast. Wound is slightly smaller today. 6/27; patient presents for follow-up. He has been using Vashe wet-to-dry dressings. He is using an Art gallery manager for mobility to help relieve the pressure off the wound bed. Wound is smaller today. He wants to hold off on doing the total contact cast as he is showing signs of improvement with wound healing. 7/11; patient presents  for follow-up. He has been using Vashe wet-to-dry dressings and his electric scooter for offloading. Wound is slightly smaller. 7/25; patient presents for follow up. He has been using Vashe wet-to-dry dressings and he reports using his electric scooter for offloading. Wound is larger. 7/30; patient presents for follow-up. He has been using Vashe wet-to-dry dressings. He has taken Augmentin as prescribed without issues. Overall wound has healthier granulation tissue. 8/1; patient presents for follow-up. He presents for obligatory cast change. He tolerated the cast well and has no issues or complaints today. We have been using antibiotic ointment with Hydrofera Blue under the cast. Wound is slightly smaller. 8/8; patient presents for follow-up. We have been using antibiotic ointment with Hydrofera Blue under the total contact cast. Wound is smaller. He has no issues or complaints today. 8/13; patient presents for follow-up. We have been using antibiotic ointment with Hydrofera Blue under the total contact cast. He comes in today because the top part of the cast over the foot has sunken as well as on the side. He wanted to assure that there were no issues. 8/22; patient presents for follow-up. He has been using  Hydrofera Blue to the wound bed. Previously we have been using a total contact cast. We do not have total contact casts due to manufacturing issues available for patients. He has no issues or complaints. He denies signs of infection. 8/27; patient presents for follow-up. He has been using Vashe wet-to-dry dressings. T contact cast is available for placement today. He was agreeable to otal proceed with this. He denies signs of infection. 9/3; patient presents for follow-up. We have been using Hydrofera Blue with antibiotic ointment under the total contact cast for the past week. He has tolerated this well. The wound is smaller. Unfortunately we do not have a total contact cast in office today for replacement as there is been manufacturing issues. Patient History Information obtained from Patient. Family History Heart Disease - Mother,Father, No family history of Cancer, Diabetes, Hereditary Spherocytosis, Hypertension, Kidney Disease, Lung Disease, Seizures, Stroke, Thyroid Problems, Tuberculosis. Social History Never smoker, Marital Status - Single, Alcohol Use - Rarely, Drug Use - No History, Caffeine Use - Rarely. 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 49 Denies history of Angina, Arrhythmia, Deep Vein Thrombosis, Hypotension, Peripheral Arterial Disease, Peripheral Venous Disease, Phlebitis, Vasculitis Gastrointestinal Denies history of Cirrhosis ,  Colitis, Crohns, Hepatitis A, Hepatitis B, Hepatitis C Endocrine Patient has history of Type II Diabetes Genitourinary Denies history of End Stage Renal Disease Immunological Denies history of Lupus Erythematosus, Raynauds, Scleroderma Integumentary (Skin) Denies history of History of Burn Musculoskeletal Denies history of Gout, Rheumatoid Arthritis, Osteoarthritis, Osteomyelitis [name], [name] ([id number]) [id number] Page 8 of 11 Neurologic Patient has history of Neuropathy Denies history of Dementia, Quadriplegia, Paraplegia, Seizure Disorder Oncologic Denies history of Received Chemotherapy, Received Radiation Psychiatric Denies history of Anorexia/bulimia, Confinement Anxiety Hospitalization/Surgery History - Heart Cath in 2018. Medical A Surgical History Notes nd Cardiovascular Ischemic Cardiomyopathy Genitourinary Renal Insufficiency Objective Constitutional respirations regular, non-labored and within target range for patient.. Vitals Time Taken: 7:56 AM, Height: 78 in, Weight: 275 lbs, BMI: 31.8, Temperature: 97.8 F, Pulse:  82 bpm, Respiratory Rate: 18 breaths/min, Blood Pressure: 107/75 mmHg. Cardiovascular 2+ dorsalis pedis/posterior tibialis pulses. Psychiatric pleasant and cooperative. General Notes: Left foot: T the lateral aspect fifth met head there is an open wound with granulation tissue, slough and callus. No erythema or increased o warmth to the periwound. Integumentary (Hair, Skin) Wound #3 status is Open. Original cause of wound was Gradually Appeared. The date acquired was: 10/01/2022. The wound has been in treatment 31 weeks. The wound is located on the Left,Plantar Foot. The wound measures 0.9cm length x 0.4cm width x 0.1cm depth; 0.283cm^2 area and 0.028cm^3 volume. There is Fat Layer (Subcutaneous Tissue) exposed. There is no tunneling or undermining noted. There is a medium amount of serosanguineous drainage noted.  The wound margin is thickened. There is large (67-100%) red, pink granulation within the wound bed. There is no necrotic tissue within the wound bed. The periwound skin appearance exhibited: Callus. The periwound skin appearance did not exhibit: Crepitus, Excoriation, Induration, Rash, Scarring, Dry/Scaly, Maceration, Atrophie Blanche, Cyanosis, Ecchymosis, Hemosiderin Staining, Mottled, Pallor, Rubor, Erythema. Periwound temperature was noted as No Abnormality. 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 I debrided nonviable tissue. Since there is no total contact cast available for placement I recommended going back to Vashe wet-to-dry dressings with aggressive offloading with a surgical shoe and peg assist. Follow-up in 1 week. Procedures Wound #3 Pre-procedure diagnosis of Wound #3 is a Diabetic Wound/Ulcer of the Lower Extremity located on the Left,Plantar Foot .Severity of Tissue Pre Debridement is: Fat layer exposed. There was a Excisional Skin/Subcutaneous Tissue Debridement with a total area of 0.28 sq cm performed by Geralyn Corwin, DO. With the following instrument(s): Curette to remove Viable and Non-Viable tissue/material. Material removed includes Callus, Subcutaneous Tissue, and Slough after achieving pain control using Lidocaine 4% Topical Solution. No specimens were taken. A time out was conducted at 08:49, prior to the start of the procedure. A Minimum amount of bleeding was controlled with Pressure. The procedure was tolerated well with a pain level of 0 throughout and a pain level of 0 following the procedure. Post Debridement Measurements: 0.9cm length x 0.4cm width x 0.1cm depth; 0.028cm^3 volume. Character of Wound/Ulcer Post Debridement is  improved. Severity of Tissue Post Debridement is: Fat layer exposed. Post procedure Diagnosis Wound #3: Same as Pre-Procedure General Notes: Scribed for Dr Mikey Bussing by Brenton Grills RN. [name], [name] ([id number]) [id number] Page 9 of 11 Plan Follow-up Appointments: Return Appointment in 1 week. - Dr Mikey Bussing Room 8 Return Appointment in 2 weeks. - Dr. Mikey Bussing room 8 (please schedule for patient) Return appointment in 3 weeks. - Dr Mikey Bussing Room 8 (please schedule for patient) Return appointment in 1 month. - Dr .Mikey Bussing Room 8 (please schedule for patient) Anesthetic: (In clinic) Topical Lidocaine 4% applied to wound bed Bathing/ Shower/ Hygiene: May shower with protection but do not get wound dressing(s) wet. Protect dressing(s) with water repellant cover (for example, large plastic bag) or a cast cover and may then take shower. Off-Loading: T Contact Cast to Left Lower Extremity - size 3 - no cast available on 09/02/23. heel cup to toes. foam pad to anterior ankle- applied 08/26/2023 otal Open toe surgical shoe to: - with Peg assist. add felt to shoe to aid in offloading. use knee scooter when you  need to. HOLD 08/26/23 TCC #3 applied WOUND #3: - Foot Wound Laterality: Plantar, Left Cleanser: Soap and Water 1 x Per Day/30 Days Discharge Instructions: May shower and wash wound with dial antibacterial soap and water prior to dressing change. Cleanser: Vashe 5.8 (oz) 1 x Per Day/30 Days Discharge Instructions: Cleanse the wound with Vashe prior to applying a clean dressing using gauze sponges, not tissue or cotton balls. Prim Dressing: Vashe wet to dry 1 x Per Day/30 Days ary Discharge Instructions: vashe moisten gauze to wound bed. Prim Dressing: 2x2 gauze 1 x Per Day/30 Days ary Discharge Instructions: moisten with vashe. Secondary Dressing: ABD Pad, 5x9 1 x Per Day/30 Days 1. In office sharp debridement 2. Vashe wet-to-dry dressings 3. Aggressive offloading  with surgical shoe and peg assist with foam donut 4. Follow-up in 1 week Electronic Signature(s) Signed: 09/02/2023 2:04:51 PM By: Geralyn Corwin DO Entered By: Geralyn Corwin on 09/02/2023 10:07:09 -------------------------------------------------------------------------------- HxROS Details Patient Name: Date of Service: [name] IN, [name] 09/02/2023 8:00 A M Medical Record Number: [id number] Patient Account Number: [id number] Date of Birth/Sex: Treating RN: Jul 11, 1971 (52 y.o. M) Primary Care Provider: [name] Other Clinician: Referring Provider: Treating Provider/Extender: [name] in Treatment: 31 Information Obtained From Patient Eyes Medical History: Negative for: Cataracts; Glaucoma; Optic Neuritis Ear/Nose/Mouth/Throat Medical History: Positive for: Chronic sinus problems/congestion Negative for: Middle ear problems Hematologic/Lymphatic Medical History: Negative for: Anemia; Hemophilia; Human Immunodeficiency Virus; Lymphedema; Sickle Cell Disease Respiratory Medical History: Negative for: Aspiration; Asthma; Chronic Obstructive Pulmonary Disease (COPD); Pneumothorax; Sleep Apnea; Tuberculosis [name], [name] ([id number]) [id number] Page 10 of 11 Cardiovascular Medical History: Positive for: Congestive Heart Failure; Coronary Artery Disease; Hypertension; Myocardial Infarction - age 52 Negative for: Angina; Arrhythmia; Deep Vein Thrombosis; Hypotension; Peripheral Arterial Disease; Peripheral Venous Disease; Phlebitis; Vasculitis Past Medical History Notes: Ischemic Cardiomyopathy Gastrointestinal Medical History: Negative for: Cirrhosis ; Colitis; Crohns; Hepatitis A; Hepatitis B; Hepatitis C Endocrine Medical History: Positive for: Type II Diabetes Time with diabetes: ten years Treated with: Insulin Blood sugar tested every day: No Genitourinary Medical History: Negative for: End Stage  Renal Disease Past Medical History Notes: Renal Insufficiency Immunological Medical History: Negative for: Lupus Erythematosus; Raynauds; Scleroderma Integumentary (Skin) Medical History: Negative for: History of Burn Musculoskeletal Medical History: Negative for: Gout; Rheumatoid Arthritis; Osteoarthritis; Osteomyelitis Neurologic Medical History: Positive for: Neuropathy Negative for: Dementia; Quadriplegia; Paraplegia; Seizure Disorder Oncologic Medical History: Negative for: Received Chemotherapy; Received Radiation Psychiatric Medical History: Negative for: Anorexia/bulimia; Confinement Anxiety HBO Extended History Items Ear/Nose/Mouth/Throat: Chronic sinus problems/congestion Immunizations Pneumococcal Vaccine: Received Pneumococcal Vaccination: No Implantable Devices None Hospitalization / Surgery History Type of Hospitalization/Surgery Heart Cath in 2018 Family and Social History [name], [name] ([id number]) [id number] Page 11 of 11 Cancer: No; Diabetes: No; Heart Disease: Yes - Mother,Father; Hereditary Spherocytosis: No; Hypertension: No; Kidney Disease: No; Lung Disease: No; Seizures: No; Stroke: No; Thyroid Problems: No; Tuberculosis: No; Never smoker; Marital Status - Single; Alcohol Use: Rarely; Drug Use: No History; Caffeine Use: Rarely; Financial Concerns: No; Food, Clothing or Shelter Needs: No; Support System Lacking: No; Transportation Concerns: No Electronic Signature(s) Signed: 09/02/2023 2:04:51 PM By: Geralyn Corwin DO Entered By: Geralyn Corwin on 09/02/2023 10:01:59 -------------------------------------------------------------------------------- SuperBill Details Patient Name: Date of Service: [name] IN, [name] 09/02/2023 Medical Record Number: [id number] Patient Account Number: [id number] Date of Birth/Sex: Treating RN: Mar 06, 1971 (52 y.o. Yates Decamp Primary Care Provider: [name] Other  Clinician: Referring Provider: Treating Provider/Extender: [name] in Treatment:  31 Diagnosis Coding ICD-10 Codes Code Description L97.522 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.621 Type 2 diabetes mellitus with foot ulcer Z89.422 Acquired absence of other left toe(s) Facility Procedures : CPT4 Code: [id number] Description: 11042 - DEB SUBQ TISSUE 20 SQ CM/< ICD-10 Diagnosis Description L97.522 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.621 Type 2 diabetes mellitus with foot ulcer Modifier: Quantity: 1 Physician Procedures : CPT4 Code Description Modifier [id number] 11042 - WC PHYS SUBQ TISS 20 SQ CM ICD-10 Diagnosis Description L97.522 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.621 Type 2 diabetes mellitus with foot ulcer Quantity: 1 Electronic Signature(s) Signed: 09/02/2023 2:04:51 PM By: Geralyn Corwin DO Entered By: Geralyn Corwin on 09/02/2023 10:13:01

## 2023-09-11 ENCOUNTER — Ambulatory Visit (HOSPITAL_BASED_OUTPATIENT_CLINIC_OR_DEPARTMENT_OTHER): Payer: 59 | Admitting: Internal Medicine

## 2023-09-11 ENCOUNTER — Encounter (HOSPITAL_BASED_OUTPATIENT_CLINIC_OR_DEPARTMENT_OTHER): Payer: 59 | Admitting: Internal Medicine

## 2023-09-11 ENCOUNTER — Other Ambulatory Visit: Payer: Self-pay

## 2023-09-11 DIAGNOSIS — E1122 Type 2 diabetes mellitus with diabetic chronic kidney disease: Secondary | ICD-10-CM | POA: Diagnosis not present

## 2023-09-11 DIAGNOSIS — E11621 Type 2 diabetes mellitus with foot ulcer: Secondary | ICD-10-CM

## 2023-09-11 DIAGNOSIS — E785 Hyperlipidemia, unspecified: Secondary | ICD-10-CM | POA: Diagnosis not present

## 2023-09-11 DIAGNOSIS — L97522 Non-pressure chronic ulcer of other part of left foot with fat layer exposed: Secondary | ICD-10-CM

## 2023-09-11 DIAGNOSIS — I130 Hypertensive heart and chronic kidney disease with heart failure and stage 1 through stage 4 chronic kidney disease, or unspecified chronic kidney disease: Secondary | ICD-10-CM | POA: Diagnosis not present

## 2023-09-11 DIAGNOSIS — I2510 Atherosclerotic heart disease of native coronary artery without angina pectoris: Secondary | ICD-10-CM | POA: Diagnosis not present

## 2023-09-11 DIAGNOSIS — I5042 Chronic combined systolic (congestive) and diastolic (congestive) heart failure: Secondary | ICD-10-CM | POA: Diagnosis not present

## 2023-09-11 DIAGNOSIS — E1140 Type 2 diabetes mellitus with diabetic neuropathy, unspecified: Secondary | ICD-10-CM | POA: Diagnosis not present

## 2023-09-11 DIAGNOSIS — N189 Chronic kidney disease, unspecified: Secondary | ICD-10-CM | POA: Diagnosis not present

## 2023-09-11 DIAGNOSIS — Z794 Long term (current) use of insulin: Secondary | ICD-10-CM | POA: Diagnosis not present

## 2023-09-11 NOTE — Progress Notes (Signed)
TAVIONE, REIGNER ([id number]) [id number] Page 1 of 8 Visit Report for 09/11/2023 Arrival Information Details Patient Name: Date of Service: [name] [place], [name] RO LD 09/11/2023 9:30 A M Medical Record Number: [id number] Patient Account Number: [id number] Date of Birth/Sex: Treating RN: 03-01-1971 (52 y.o. [name] Primary Care Amiah Frohlich: [name] Other Clinician: Referring Kiyo Heal: Treating Denissa Cozart/Extender: [name] in Treatment: 32 Visit Information History Since Last Visit Added or deleted any medications: No Patient Arrived: Ambulatory Any new allergies or adverse reactions: No Arrival Time: 09:50 Had a fall or experienced change in No Accompanied By: self activities of daily living that may affect Transfer Assistance: None risk of falls: Patient Identification Verified: Yes Signs or symptoms of abuse/neglect since last visito No Patient Requires Transmission-Based Precautions: No Hospitalized since last visit: No Patient Has Alerts: Yes Implantable device outside of the clinic excluding No Patient Alerts: 08/2021 ABI:1.2 TBI 0.79 cellular tissue based products placed in the center since last visit: Has Dressing in Place as Prescribed: Yes Has Compression in Place as Prescribed: Yes Pain Present Now: No Electronic Signature(s) Signed: 09/11/2023 1:00:45 PM By: Karie Schwalbe RN Entered By: Karie Schwalbe on 09/11/2023 10:23:54 -------------------------------------------------------------------------------- Encounter Discharge Information Details Patient Name: Date of Service: [name] [place], [name] 09/11/2023 9:30 A M Medical Record Number: [id number] Patient Account Number: [id number] Date of Birth/Sex: Treating RN: 09/15/1971 (52 y.o. [name] Primary Care Johnson Arizola: [name] Other Clinician: Referring Vanecia Limpert: Treating Avid Guillette/Extender: [name] in  Treatment: 32 Encounter Discharge Information Items Post Procedure Vitals Discharge Condition: Stable Temperature (F): 98 Ambulatory Status: Ambulatory Pulse (bpm): 83 Discharge Destination: Home Respiratory Rate (breaths/min): 16 Transportation: Private Auto Blood Pressure (mmHg): 110/76 Accompanied By: self Schedule Follow-up Appointment: Yes Clinical Summary of Care: Patient Declined Electronic Signature(s) Signed: 09/11/2023 1:00:45 PM By: Karie Schwalbe RN Entered By: Karie Schwalbe on 09/11/2023 10:52:20 [name] ([id number]) [id number] Page 2 of 8 -------------------------------------------------------------------------------- Lower Extremity Assessment Details Patient Name: Date of Service: [name] [place], [name] RO LD 09/11/2023 9:30 A M Medical Record Number: [id number] Patient Account Number: [id number] Date of Birth/Sex: Treating RN: 1971-02-21 (52 y.o. [name] Primary Care Lanaysia Fritchman: [name] Other Clinician: Referring Nataliah Hatlestad: Treating Deyra Perdomo/Extender: [name] in Treatment: 32 Edema Assessment Assessed: [name]: No] [name]: No] Edema: [Left: N] [Right: o] Calf Left: Right: Point of Measurement: 43 cm From Medial Instep 37.2 cm Ankle Left: Right: Point of Measurement: 9 cm From Medial Instep 22.1 cm Vascular Assessment Pulses: Dorsalis Pedis Palpable: [Left:Yes] Extremity colors, hair growth, and conditions: Extremity Color: [Left:Normal] Hair Growth on Extremity: [Left:Yes] Temperature of Extremity: [Left:Warm] Capillary Refill: [Left:< 3 seconds] Dependent Rubor: [Left:No No] Electronic Signature(s) Signed: 09/11/2023 1:00:45 PM By: Karie Schwalbe RN Entered By: Karie Schwalbe on 09/11/2023 10:24:47 -------------------------------------------------------------------------------- Multi Wound Chart Details Patient Name: Date of Service: [name] [place], [name] 09/11/2023 9:30 A  M Medical Record Number: [id number] Patient Account Number: [id number] Date of Birth/Sex: Treating RN: 01-13-1971 (52 y.o. M) Primary Care Lamyia Cdebaca: [name] Other Clinician: Referring Hellena Pridgen: Treating Blossie Raffel/Extender: [name] in Treatment: 32 Vital Signs Height(in): 78 Pulse(bpm): 83 Weight(lbs): 275 Blood Pressure(mmHg): 110/76 Body Mass Index(BMI): 31.8 Temperature(F): 98 Respiratory Rate(breaths/min): 16 [3:Photos:] [N/A:N/A] Left, Plantar Foot N/A N/A Wound Location: Gradually Appeared N/A N/A Wounding Event: Diabetic Wound/Ulcer of the Lower N/A N/A Primary Etiology: Extremity Chronic sinus problems/congestion, N/A N/A Comorbid History: Congestive Heart Failure, Coronary Artery Disease, Hypertension, Myocardial Infarction, Type  II Diabetes, Neuropathy 10/01/2022 N/A N/A Date Acquired: 48 N/A N/A Weeks of Treatment: Open N/A N/A Wound Status: No N/A N/A Wound Recurrence: Yes N/A N/A Pending [name] on Presentation: 1x0.7x0.2 N/A N/A Measurements L x W x D (cm) 0.55 N/A N/A A (cm) : rea 0.11 N/A N/A Volume (cm) : 86.00% N/A N/A % Reduction in A rea: 86.00% N/A N/A % Reduction in Volume: Grade 1 N/A N/A Classification: Medium N/A N/A Exudate A mount: Serosanguineous N/A N/A Exudate Type: red, brown N/A N/A Exudate Color: Thickened N/A N/A Wound Margin: Large (67-100%) N/A N/A Granulation A mount: Red, Pink N/A N/A Granulation Quality: None Present (0%) N/A N/A Necrotic A mount: Fat Layer (Subcutaneous Tissue): Yes N/A N/A Exposed Structures: Fascia: No Tendon: No Muscle: No Joint: No Bone: No Medium (34-66%) N/A N/A Epithelialization: Debridement - Excisional N/A N/A Debridement: Pre-procedure Verification/Time Out 10:40 N/A N/A Taken: Lidocaine 4% Topical Solution N/A N/A Pain Control: Callus, Subcutaneous, Slough N/A N/A Tissue Debrided: Skin/Subcutaneous Tissue N/A N/A Level: 0.55  N/A N/A Debridement A (sq cm): rea Curette N/A N/A Instrument: Minimum N/A N/A Bleeding: Pressure N/A N/A Hemostasis A chieved: 0 N/A N/A Procedural Pain: 0 N/A N/A Post Procedural Pain: Procedure was tolerated well N/A N/A Debridement Treatment Response: 1x0.7x0.2 N/A N/A Post Debridement Measurements L x W x D (cm) 0.11 N/A N/A Post Debridement Volume: (cm) Callus: Yes N/A N/A Periwound Skin Texture: Excoriation: No Induration: No Crepitus: No Rash: No Scarring: No Maceration: No N/A N/A Periwound Skin Moisture: Dry/Scaly: No Atrophie Blanche: No N/A N/A Periwound Skin Color: Cyanosis: No Ecchymosis: No Erythema: No Hemosiderin Staining: No Mottled: No Pallor: No Rubor: No No Abnormality N/A N/A Temperature: Debridement N/A N/A Procedures Performed: T Contact Cast otal Treatment Notes Wound #3 (Foot) Wound Laterality: Plantar, Left Cleanser Soap and Water Discharge Instruction: May shower and wash wound with dial antibacterial soap and water prior to dressing change. [name], [name] ([id number]) [id number] Page 4 of 8 Vashe 5.8 (oz) Discharge Instruction: Cleanse the wound with Vashe prior to applying a clean dressing using gauze sponges, not tissue or cotton balls. Peri-Wound Care Topical Gentamicin Discharge Instruction: As directed by physician Mupirocin Ointment Discharge Instruction: Apply Mupirocin (Bactroban) as instructed Primary Dressing Hydrofera Blue Ready Transfer Foam, 2.5x2.5 (in/in) Discharge Instruction: Apply directly to wound bed as directed Optifoam Non-Adhesive Dressing, 4x4 in Discharge Instruction: Apply to ankles and midfoot Heel cup Discharge Instruction: cover toes prior to casting TCC #3 Secondary Dressing ABD Pad, 5x9 Secured With [employer] Sterile, 4.5x3.1 (in/yd) Discharge Instruction: Secure with Kerlix as directed. 20M Medipore H Soft Cloth Surgical T ape, 4 x 10 (in/yd) Discharge  Instruction: Secure with tape as directed. Compression Wrap Compression Stockings Add-Ons Electronic Signature(s) Signed: 09/11/2023 3:50:21 PM By: Geralyn Corwin DO Entered By: Geralyn Corwin on 09/11/2023 12:44:04 -------------------------------------------------------------------------------- Multi-Disciplinary Care Plan Details Patient Name: Date of Service: [name], [name] 09/11/2023 9:30 A M Medical Record Number: [id number] Patient Account Number: [id number] Date of Birth/Sex: Treating RN: 1971/01/26 (52 y.o. [name] Primary Care Geanette Buonocore: [name] Other Clinician: Referring Batu Cassin: Treating Devita Nies/Extender: [name] in Treatment: 32 Active Inactive Wound/Skin Impairment Nursing Diagnoses: Impaired tissue integrity Knowledge deficit related to ulceration/compromised skin integrity Goals: Patient will have a decrease in wound volume by X% from date: (specify in notes) Date Initiated: 01/24/2023 Target Resolution Date: 12/30/2023 Goal Status: Active Patient/caregiver will verbalize understanding of skin care regimen Date Initiated: 01/24/2023 Target Resolution Date: 12/30/2023 Goal Status:  [address] [name], [name] ([id number]) [id number] Page 5 of 8 Ulcer/skin breakdown will have a volume reduction of 30% by week 4 Date Initiated: 01/24/2023 Date Inactivated: 03/07/2023 Target Resolution Date: 02/27/2023 Goal Status: Unmet Unmet Reason: larger today. Ulcer/skin breakdown will have a volume reduction of 50% by week 8 Date Initiated: 01/24/2023 Target Resolution Date: 12/30/2023 Goal Status: Active Interventions: Assess patient/caregiver ability to obtain necessary supplies Assess patient/caregiver ability to perform ulcer/skin care regimen upon admission and as needed Assess ulceration(s) every visit Notes: Electronic Signature(s) Signed: 09/11/2023 1:00:45 PM By: Karie Schwalbe  RN Entered By: Karie Schwalbe on 09/11/2023 10:31:10 -------------------------------------------------------------------------------- Pain Assessment Details Patient Name: Date of Service: [name], [name] 09/11/2023 9:30 A M Medical Record Number: [id number] Patient Account Number: [id number] Date of Birth/Sex: Treating RN: [birth year] (52 y.o. [name] Primary Care Kemia Wendel: [name] Other Clinician: Referring Aidah Forquer: Treating Greidys Deland/Extender: [name] in Treatment: 32 Active Problems Location of Pain Severity and Description of Pain Patient Has Paino No Site Locations Pain Management and Medication Current Pain Management: Electronic Signature(s) Signed: 09/11/2023 1:00:45 PM By: Karie Schwalbe RN Entered By: Karie Schwalbe on 09/11/2023 10:24:33 [name] ([id number]) [id number] Page 6 of 8 -------------------------------------------------------------------------------- Patient/Caregiver Education Details Patient Name: Date of Service: [name] [place], [name] RO LD 9/12/2024andnbsp9:30 A M Medical Record Number: [id number] Patient Account Number: [id number] Date of Birth/Gender: Treating RN: Apr 12, 1971 (52 y.o. [name] Primary Care Physician: [name] Other Clinician: Referring Physician: Treating Physician/Extender: [name] in Treatment: 32 Education Assessment Education Provided To: Patient Education Topics Provided Wound/Skin Impairment: Methods: Explain/Verbal Responses: State content correctly [employer]) Signed: 09/11/2023 1:00:45 PM By: Karie Schwalbe RN Entered By: Karie Schwalbe on 09/11/2023 10:32:23 -------------------------------------------------------------------------------- Wound Assessment Details Patient Name: Date of Service: [name], [name] 09/11/2023 9:30 A M Medical Record Number: [id number] Patient  Account Number: [id number] Date of Birth/Sex: Treating RN: 1971-07-10 (52 y.o. [name] Primary Care Jansel Vonstein: [name] Other Clinician: Referring Veron Senner: Treating Arleene Settle/Extender: [name] in Treatment: 32 Wound Status Wound Number: 3 Primary Diabetic Wound/Ulcer of the Lower Extremity Etiology: Wound Location: Left, Plantar Foot Wound Open Wounding Event: Gradually Appeared Status: Date Acquired: 10/01/2022 Comorbid Chronic sinus problems/congestion, Congestive Heart Failure, Weeks Of Treatment: 32 History: Coronary Artery Disease, Hypertension, Myocardial Infarction, Type Clustered Wound: No II Diabetes, Neuropathy Pending Amputation On Presentation Wound under treatment by [facility] L[facility] outside of Wound Center Photos Wound Measurements Length: (cm) 1 Width: (cm) 0.7 Depth: (cm) 0.2 Area: (cm) 0.55 Volume: (cm) 0.11 [name], [name] ([id number]) Wound Description Classification: Grade 1 Wound Margin: Thickened Exudate Amount: Medium Exudate Type: Serosanguineous Exudate Color: red, brown Foul Odor After Cleansing: No Slough/Fibrino No % Reduction in Area: 86% % Reduction in Volume: 86% Epithelialization: Medium (34-66%) Tunneling: No Undermining: No [id number] Page 7 of 8 Wound Bed Granulation Amount: Large (67-100%) Exposed Structure Granulation Quality: Red, Pink Fascia Exposed: No Necrotic Amount: None Present (0%) Fat Layer (Subcutaneous Tissue) Exposed: Yes Tendon Exposed: No Muscle Exposed: No Joint Exposed: No Bone Exposed: No Periwound Skin Texture Texture Color No Abnormalities Noted: No No Abnormalities Noted: No Callus: Yes Atrophie Blanche: No Crepitus: No Cyanosis: No Excoriation: No Ecchymosis: No Induration: No Erythema: No Rash: No Hemosiderin Staining: No Scarring: No Mottled: No Pallor: No Moisture Rubor: No No Abnormalities Noted: No Dry / Scaly: No  Temperature / Pain Maceration: No Temperature: No Abnormality Treatment Notes Wound #3 (Foot) Wound Laterality: Plantar, Left Cleanser  Soap and Water Discharge Instruction: May shower and wash wound with dial antibacterial soap and water prior to dressing change. Vashe 5.8 (oz) Discharge Instruction: Cleanse the wound with Vashe prior to applying a clean dressing using gauze sponges, not tissue or cotton balls. Peri-Wound Care Topical Gentamicin Discharge Instruction: As directed by physician Mupirocin Ointment Discharge Instruction: Apply Mupirocin (Bactroban) as instructed Primary Dressing Hydrofera Blue Ready Transfer Foam, 2.5x2.5 (in/in) Discharge Instruction: Apply directly to wound bed as directed Optifoam Non-Adhesive Dressing, 4x4 in Discharge Instruction: Apply to ankles and midfoot Heel cup Discharge Instruction: cover toes prior to casting TCC #3 Secondary Dressing ABD Pad, 5x9 Secured With [employer] Sterile, 4.5x3.1 (in/yd) Discharge Instruction: Secure with Kerlix as directed. 46M Medipore H Soft Cloth Surgical T ape, 4 x 10 (in/yd) Discharge Instruction: Secure with tape as directed. Compression Wrap Compression Stockings Add-Ons Electronic Signature(s) [name], [name] ([id number]) [id number] Page 8 of 8 Signed: 09/11/2023 1:00:45 PM By: Karie Schwalbe RN Entered By: Karie Schwalbe on 09/11/2023 10:15:02 -------------------------------------------------------------------------------- Vitals Details Patient Name: Date of Service: [name] [place], [name] 09/11/2023 9:30 A M Medical Record Number: [id number] Patient Account Number: [id number] Date of Birth/Sex: Treating RN: July 19, 1971 (52 y.o. [name] Primary Care Seab Axel: [name] Other Clinician: Referring Fabricio Endsley: Treating Genevie Elman/Extender: [name] in Treatment: 32 Vital Signs Time Taken: 09:55 Temperature (F): 98 Height  (in): 78 Pulse (bpm): 83 Weight (lbs): 275 Respiratory Rate (breaths/min): 16 Body Mass Index (BMI): 31.8 Blood Pressure (mmHg): 110/76 Reference Range: 80 - 120 mg / dl Electronic Signature(s) Signed: 09/11/2023 1:00:45 PM By: Karie Schwalbe RN Entered By: Karie Schwalbe on 09/11/2023 10:24:25

## 2023-09-11 NOTE — Progress Notes (Signed)
MONTA, BARENTINE ([id number]) [id number] Page 1 of 12 Visit Report for 09/11/2023 Chief Complaint Document Details Patient Name: Date of Service: [name] [place], [name] RO LD 09/11/2023 9:30 A M Medical Record Number: [id number] Patient Account Number: [id number] Date of Birth/Sex: Treating RN: [birth year] (52 y.o. M) Primary Care Provider: [name] Other Clinician: Referring Provider: Treating Provider/Extender: [name] in Treatment: 32 Information Obtained from: Patient Chief Complaint 01/15/2023; left foot wound Electronic Signature(s) Signed: 09/11/2023 3:50:21 PM By: Geralyn Corwin DO Entered By: Geralyn Corwin on 09/11/2023 12:44:12 -------------------------------------------------------------------------------- Debridement Details Patient Name: Date of Service: [name] [place], [name] 09/11/2023 9:30 A M Medical Record Number: [id number] Patient Account Number: [id number] Date of Birth/Sex: Treating RN: 07-Mar-1971 (52 y.o. [name] Primary Care Provider: [name] Other Clinician: Referring Provider: Treating Provider/Extender: [name] in Treatment: 32 Debridement Performed for Assessment: Wound #3 Left,Plantar Foot Performed By: Physician Geralyn Corwin, DO The following information was scribed by: Karie Schwalbe The information was scribed for: Geralyn Corwin Debridement Type: Debridement Severity of Tissue Pre Debridement: Fat layer exposed Level of Consciousness (Pre-procedure): Awake and Alert Pre-procedure Verification/Time Out Yes - 10:40 Taken: Start Time: 10:40 Pain Control: Lidocaine 4% T opical Solution Percent of Wound Bed Debrided: 100% T Area Debrided (cm): otal 0.55 Tissue and other material debrided: Viable, Non-Viable, Callus, Slough, Subcutaneous, Slough Level: Skin/Subcutaneous Tissue Debridement Description: Excisional Instrument:  Curette Bleeding: Minimum Hemostasis Achieved: Pressure End Time: 10:43 Procedural Pain: 0 Post Procedural Pain: 0 Response to Treatment: Procedure was tolerated well Level of Consciousness (Post- Awake and Alert procedure): Post Debridement Measurements of Total Wound Length: (cm) 1 Width: (cm) 0.7 Depth: (cm) 0.2 [name], [name] ([id number]) [id number] Page 2 of 12 Volume: (cm) 0.11 Character of Wound/Ulcer Post Debridement: Improved Severity of Tissue Post Debridement: Fat layer exposed Post Procedure Diagnosis Same as Pre-procedure Electronic Signature(s) Signed: 09/11/2023 1:00:45 PM By: Karie Schwalbe RN Signed: 09/11/2023 3:50:21 PM By: Geralyn Corwin DO Entered By: Karie Schwalbe on 09/11/2023 10:51:07 -------------------------------------------------------------------------------- HPI Details Patient Name: Date of Service: [name] [place], [name] 09/11/2023 9:30 A M Medical Record Number: [id number] Patient Account Number: [id number] Date of Birth/Sex: Treating RN: [birth year] (52 y.o. M) Primary Care Provider: [name] Other Clinician: Referring Provider: Treating Provider/Extender: [name] in Treatment: 32 History of Present Illness HPI Description: ADMISSION 01/11/2020 This is a 52 year old man with uncontrolled type 2 diabetes with a recent hemoglobin A1c earlier this year of 13.4. He is on insulin and glipizide. He does not take his blood sugars at home. He does have a follow-up with primary care later this month I believe on January 27. He tells [place] that roughly a month ago he was walking with a shoe with a hole in his foot. He took the shoe off and there was an open wound at roughly the left fourth met head. This has significant undermining and raised edges. He has not noticed any purulence he does not feel unwell. More recently he was taking skin off the bottom of his foot and has a superficial  area on the left fifth met head. He has not been offloading this. The patient was in the ER on 12/20. They gave him Bactroban which she has been using on the wound and 10 days worth of doxycycline. No x-rays were done. He has not had vascular studies. He is also been using hydrogen peroxide. Past medical history type 2 diabetes uncontrolled, chronic systolic heart failure,  coronary artery disease with a history of congestive heart failure with stents. Hypertension hyperlipidemia and chronic renal insufficiency ABI in this clinic was 1.14 on the left. 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 He is uncertain whether he would be able to work if we put him on some form of restriction 1/19; he is generally doing quite well. Using silver alginate on the wounds. Things actually look better. He has a forefoot offloading boot which she seems to be compliant about. He has support at work to stay off his foot is much as possible which is gratifying. Culture I did last week showed a few Enterococcus faecalis. I am going to put him on Augmentin. I talked about ordering an x-ray in my note last week but that does not seem to have happened. We will review reorder the x-ray this week. 1/26; x-ray reordered last week was negative for osteomyelitis. We are using silver alginate on the wound on the third and fifth met heads on the left. He is using a Darco forefoot off loader 2/2; the area on the fifth met head is closed. Third met head is still open with tunneling depth and thick callus. 2/9; the area on the fifth met head remains closed however the third met head again has a small open area on presentation with tunneling in depth and surrounded by thick callus. This looks like a pressure issue. We have been using silver alginate 2/16; the area of the fifth metatarsal head remains closed however the area over the third metatarsal head again is a small open area but with some depth. I  do not think this is changed much since last week. He is using Hydrofera Blue with forefoot off loader. He is not able to use a total contact cast on the left leg because he needs his left leg at work [employer]]. Fortunately the wound does not look infected. I changed him to endoform today 2/26; the area of the fifth metatarsal head remains closed. The area of the third metatarsal head has an even smaller opening this time. I used endoform on this this looks improved. He is offloading this is much as he can and a forefoot off loader on the left. He cannot have a total contact cast because of work responsibilities 3/5; the area on the fifth metatarsal head remains closed the area on the third metatarsal head is also closed on the left foot. 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 He has been following for podiatry for several months for this issue. He is currently using wet-to-dry saline dressings. He currently denies signs of infection. [place] note from 1/17; Patient presents with a 6-month history of nonhealing ulcer to the left foot secondary to diabetes and inability to offload well the area. He has had multiple debridements in the past to his feet bilaterally. He has had resection of the fourth left met head in the fifth toe Secondary to osteomyelitis. We discussed the importance of glycemic control for wound healing. Due to his blood glucose levels being elevated he is at high risk for infection and thus further amputation. He expressed understanding. He states he  is supposed to be referred to an endocrinologist at [facility]  however the referral fell through. Unclear what happened. Offered a referral to endocrinology at [facility]. Patient was agreeable.Furthermore we discussed the importance of aggressive offloading for his wound healing. This will be the most difficult part of the treatment plan for the patient to do. We discussed a total contact cast however he has declined that at this time. 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 He is currently using a surgical shoe with a peg assist. It does not fit well so we will give him a new one today. I do not think this is enough offloading. If he is [name], [name] ([id number]) [id number] Page 3 of 12 not able to offload this area he will likely end up with a BKA. He is well aware of this. For now I recommended Medihoney and Hydrofera Blue for dressing changes. He will follow-up in our [place] office Since this is closer for him. 1/26; patient presents for follow-up. Last clinic visit I had seen him in [place] and we transferred him to [place] since this is a closer location for him. Progress note above from that visit. Over the past week He has been using Medihoney and Hydrofera Blue to the wound bed. He has been using his surgical shoe with peg assist. He has no issues or complaints today. We discussed doing the total contact cast and he was agreeable to having this placed at next clinic visit. 2/2; patient presents for follow-up. He has been using Vashe wet-to-dry dressings. Plan is for the total contact cast today. 2/5; patient presents for follow-up. The cast was placed 3 days ago. He tolerated this well however had a lot of drainage. He currently denies signs of infection. 2/9; patient presents for follow-up. At last clinic visit we held off on replacing the cast due to drainage. He has been using Vashe wet-to-dry dressings and he is currently taking the oral  antibiotics prescribed without issues. He declines a total contact cast today. We ran insurance verification for skin substitute and due to cost patient declines having this placed. 2/16; patient presents for follow-up. He has decided not to follow-up with podiatry. He continues to decline the total contact cast. He has been using Vashe wet- to-dry dressings. He has not been wearing his surgical shoe with peg assist. He currently denies signs of infection. 3/1; patient presents for follow-up. He has been doing Vashe wet-to-dry dressings. He has a surgical shoe with peg assist. We discussed potentially doing a skin substitute for which she has been approved for by insurance. He knows the out-of-pocket cost of this and would still like to proceed with having this placed today. He denies the current total contact cast. 3/8; patient presents for follow-up. He states that the skin substitute came off after a few days and he has been using Vashe wet-to-dry dressings. He has mild odor to the wound bed on exam. 3/15; patient presents for follow-up. He has been using Vashe wet-to-dry dressings and started his course of Augmentin. He reports improvement in wound healing. He has no issues or complaints today. 3/21; patient presents for follow-up. He continues to use Vashe wet-to-dry dressings. He has no issues or complaints today. 3/28; patient presents for follow-up. He has been using Vashe wet-to-dry dressings. He has no issues or complaints today. He has been using his peg assist with surgical  shoe. 4/11; patient presents for follow-up. He has been using Vashe wet-to-dry dressings. The periwound is macerated. He has been using his peg assist with surgical shoe. He has no issues or complaints today. 4/25; patient presents for follow-up. Has been using Vashe wet-to-dry dressings. He has been using zinc oxide to the periwound. For offloading he has been using his peg assist in surgical shoe. He has no issues  or complaints today. 5/9; patient presents for follow-up. He has been using Vashe wet-to-dry dressings. He has no issues or complaints today. He has a surgical shoe with peg assist for offloading. 5/23; patient presents for follow up. He has been using Vashe to clean the wound bed and collagen and blast X with dressing changes. [place] went [place] the beach [place] 2 [place]. Wound is bigger. 5/31; patient presents for follow-up. He has been using Vashe wet-to-dry dressings. Wound is stable but with healthier granulation tissue. Patient would like to hold off on the total contact cast and try a knee scooter for offloading. 6/13; patient presents for follow-up. He has been using Vashe wet-to-dry dressings. He has been using a knee scooter for offloading however states he felt unstable on this and decided to buy an electric scooter. This arrived in the mail today. He would like to hold off on the left total contact cast. Wound is slightly smaller today. 6/27; patient presents for follow-up. He has been using Vashe wet-to-dry dressings. He is using an Art gallery manager for mobility to help relieve the pressure off the wound bed. Wound is smaller today. He wants to hold off on doing the total contact cast as he is showing signs of improvement with wound healing. 7/11; patient presents for follow-up. He has been using Vashe wet-to-dry dressings and his electric scooter for offloading. Wound is slightly smaller. 7/25; patient presents for follow up. He has been using Vashe wet-to-dry dressings and he reports using his electric scooter for offloading. Wound is larger. 7/30; patient presents for follow-up. He has been using Vashe wet-to-dry dressings. He has taken Augmentin as prescribed without issues. Overall wound has healthier granulation tissue. 8/1; patient presents for follow-up. He presents for obligatory cast change. He tolerated the cast well and has no issues or complaints today. We have been using antibiotic  ointment with Hydrofera Blue under the cast. Wound is slightly smaller. 8/8; patient presents for follow-up. We have been using antibiotic ointment with Hydrofera Blue under the total contact cast. Wound is smaller. He has no issues or complaints today. 8/13; patient presents for follow-up. We have been using antibiotic ointment with Hydrofera Blue under the total contact cast. He comes in today because the top part of the cast over the foot has sunken as well as on the side. He wanted to assure that there were no issues. 8/22; patient presents for follow-up. He has been using Hydrofera Blue to the wound bed. Previously we have been using a total contact cast. We do not have total contact casts due to manufacturing issues available for patients. He has no issues or complaints. He denies signs of infection. 8/27; patient presents for follow-up. He has been using Vashe wet-to-dry dressings. T contact cast is available for placement today. He was agreeable to otal proceed with this. He denies signs of infection. 9/3; patient presents for follow-up. We have been using Hydrofera Blue with antibiotic ointment under the total contact cast for the past week. He has tolerated this well. The wound is smaller. Unfortunately we do not have a  total contact cast in office today for replacement as there is been manufacturing issues. 9/5; patient was seen earlier this week and plan was for a total contact cast but there is none available. The cast finally arrived in patient has come in today to have this placed. We will continue with Hydrofera Blue and antibiotic ointment under the cast. 9/12; patient presents for follow-up. We have been using Hydrofera Blue and antibiotic ointment under the cast. Wound is stable. He denies signs of infection. Electronic Signature(s) Signed: 09/11/2023 3:50:21 PM By: Geralyn Corwin DO Entered By: Geralyn Corwin on 09/11/2023 12:44:53 [name] ([id number])  [id number] Page 4 of 12 -------------------------------------------------------------------------------- Physical Exam Details Patient Name: Date of Service: [name] [employer], [name] RO LD 09/11/2023 9:30 A M Medical Record Number: [id number] Patient Account Number: [id number] Date of Birth/Sex: Treating RN: 26-Aug-1971 (52 y.o. M) Primary Care Provider: [name] Other Clinician: Referring Provider: Treating Provider/Extender: [name] in Treatment: 32 Constitutional respirations regular, non-labored and within target range for patient.. Cardiovascular 2+ dorsalis pedis/posterior tibialis pulses. Psychiatric pleasant and cooperative. Notes Left foot: T the lateral aspect fifth met head there is an open wound with granulation tissue and non viable tissue. No erythema or increased warmth to the o periwound. Electronic Signature(s) Signed: 09/11/2023 3:50:21 PM By: Geralyn Corwin DO Entered By: Geralyn Corwin on 09/11/2023 12:45:30 -------------------------------------------------------------------------------- Physician Orders Details Patient Name: Date of Service: [name] [place], [name] 09/11/2023 9:30 A M Medical Record Number: [id number] Patient Account Number: [id number] Date of Birth/Sex: Treating RN: Mar 12, 1971 (52 y.o. [name] Primary Care Provider: [name] Other Clinician: Referring Provider: Treating Provider/Extender: [name] in Treatment: 64 Verbal / Phone Orders: No Diagnosis Coding Follow-up Appointments ppointment in 1 week. - Dr. Lady Gary 09/17/2023 at 7:45am Return A ppointment in 2 weeks. - Dr. Lady Gary please scheduled for patient. Return A Return appointment in 3 weeks. - Dr. Lady Gary please scheduled for patient. Anesthetic (In clinic) Topical Lidocaine 4% applied to wound bed Cellular or Tissue Based Products Cellular or Tissue Based Product Type: -  apligraf and grafix 50% coinsurance 02/28/23-apply 1st Grafix 03/07/2023 HOLD THIS WEEK. Bathing/ Shower/ Hygiene May shower with protection but do not get wound dressing(s) wet. Protect dressing(s) with water repellant cover (for example, large plastic bag) or a cast cover and may then take shower. Off-Loading Total Contact Cast to Left Lower Extremity - TCC size 3 heel cup to toes. foam pad to anterior ankle- applied 08/26/2023 [name], [name] ([id number]) [id number] Page 5 of 12 Wound Treatment Wound #3 - Foot Wound Laterality: Plantar, Left Cleanser: Soap and Water 1 x Per Day/30 Days Discharge Instructions: May shower and wash wound with dial antibacterial soap and water prior to dressing change. Cleanser: Vashe 5.8 (oz) 1 x Per Day/30 Days Discharge Instructions: Cleanse the wound with Vashe prior to applying a clean dressing using gauze sponges, not tissue or cotton balls. Topical: Gentamicin 1 x Per Day/30 Days Discharge Instructions: As directed by physician Topical: Mupirocin Ointment 1 x Per Day/30 Days Discharge Instructions: Apply Mupirocin (Bactroban) as instructed Prim Dressing: Hydrofera Blue Ready Transfer Foam, 2.5x2.5 (in/in) 1 x Per Day/30 Days ary Discharge Instructions: Apply directly to wound bed as directed Prim Dressing: Optifoam Non-Adhesive Dressing, 4x4 in 1 x Per Day/30 Days ary Discharge Instructions: Apply to ankles and midfoot Prim Dressing: Heel cup 1 x Per Day/30 Days ary Discharge Instructions: cover toes prior to casting Prim Dressing: TCC #3 ary 1 x Per  Day/30 Days Secondary Dressing: ABD Pad, 5x9 1 x Per Day/30 Days Secured With: Kerlix Roll Sterile, 4.5x3.1 (in/yd) 1 x Per Day/30 Days Discharge Instructions: Secure with Kerlix as directed. Secured With: 79M Medipore H Soft Cloth Surgical T ape, 4 x 10 (in/yd) 1 x Per Day/30 Days Discharge Instructions: Secure with tape as directed. Electronic Signature(s) Signed:  09/11/2023 3:50:21 PM By: Geralyn Corwin DO Entered By: Geralyn Corwin on 09/11/2023 12:45:42 -------------------------------------------------------------------------------- Problem List Details Patient Name: Date of Service: [name] [place], [name] 09/11/2023 9:30 A M Medical Record Number: [id number] Patient Account Number: [id number] Date of Birth/Sex: Treating RN: October 20, 1971 (52 y.o. M) Primary Care Provider: [name] Other Clinician: Referring Provider: Treating Provider/Extender: [name] in Treatment: 16 Active Problems ICD-10 Encounter Code Description Active Date MDM Diagnosis [phone] Non-pressure chronic ulcer of other part of left foot with fat layer exposed 01/24/2023 No Yes E11.621 Type 2 diabetes mellitus with foot ulcer 01/24/2023 No Yes Z89.422 Acquired absence of other left toe(s) 01/24/2023 No Yes Inactive Problems TAI, HENNESS ([id number]) [id number] Page 6 of 12 Resolved Problems Electronic Signature(s) Signed: 09/11/2023 3:50:21 PM By: Geralyn Corwin DO Entered By: Geralyn Corwin on 09/11/2023 12:43:57 -------------------------------------------------------------------------------- Progress Note Details Patient Name: Date of Service: [name] [place], [name] 09/11/2023 9:30 A M Medical Record Number: [id number] Patient Account Number: [id number] Date of Birth/Sex: Treating RN: 04-20-1971 (52 y.o. M) Primary Care Provider: [name] Other Clinician: Referring Provider: Treating Provider/Extender: [name] in Treatment: 32 Subjective Chief Complaint Information obtained from Patient 01/15/2023; left foot wound History of Present Illness (HPI) ADMISSION 01/11/2020 This is a 52 year old man with uncontrolled type 2 diabetes with a recent hemoglobin A1c earlier this year of 13.4. He is on insulin and glipizide. He does not take his blood sugars at home. He  does have a follow-up with primary care later this month I believe on January 27. He tells [place] that roughly a month ago he was walking with a shoe with a hole in his foot. He took the shoe off and there was an open wound at roughly the left fourth met head. This has significant undermining and raised edges. He has not noticed any purulence he does not feel unwell. More recently he was taking skin off the bottom of his foot and has a superficial area on the left fifth met head. He has not been offloading this. The patient was in the ER on 12/20. They gave him Bactroban which she has been using on the wound and 10 days worth of doxycycline. No x-rays were done. He has not had vascular studies. He is also been using hydrogen peroxide. Past medical history type 2 diabetes uncontrolled, chronic systolic heart failure, coronary artery disease with a history of congestive heart failure with stents. Hypertension hyperlipidemia and chronic renal insufficiency ABI in this clinic was 1.14 on the left. 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 He is uncertain whether he would be able to work if we put him on some form of restriction 1/19; he is generally doing quite well. Using silver alginate on the wounds. Things actually look better. He has a forefoot offloading boot which she seems to be compliant about. He has support at work to stay off his foot is much as possible which is gratifying. Culture I did last week showed a few Enterococcus faecalis. I am going to put him on Augmentin. I talked about ordering  an x-ray in my note last week but that does not seem to have happened. We will review reorder the x-ray this week. 1/26; x-ray reordered last week was negative for osteomyelitis. We are using silver alginate on the wound on the third and fifth met heads on the left. He is using a Darco forefoot off loader 2/2; the area on the fifth met head is closed. Third met head is still open  with tunneling depth and thick callus. 2/9; the area on the fifth met head remains closed however the third met head again has a small open area on presentation with tunneling in depth and surrounded by thick callus. This looks like a pressure issue. We have been using silver alginate 2/16; the area of the fifth metatarsal head remains closed however the area over the third metatarsal head again is a small open area but with some depth. I do not think this is changed much since last week. He is using Hydrofera Blue with forefoot off loader. He is not able to use a total contact cast on the left leg because he needs his left leg at work [employer]]. Fortunately the wound does not look infected. I changed him to endoform today 2/26; the area of the fifth metatarsal head remains closed. The area of the third metatarsal head has an even smaller opening this time. I used endoform on this this looks improved. He is offloading this is much as he can and a forefoot off loader on the left. He cannot have a total contact cast because of work responsibilities 3/5; the area on the fifth metatarsal head remains closed the area on the third metatarsal head is also closed on the left foot. 01/15/2023 [name] is a 52 year old male with a past medical history of uncontrolled insulin-dependent type 2 diabetes with self-reported last hemoglobin A1c of 12, previous amputations to his feet bilaterally secondary to osteomyelitis, CAD and chronic combined systolic and diastolic heart failure that presents to the clinic for a 26-month history of nonhealing ulcer to the left lateral foot. He has been following for podiatry for several months for this issue. He is currently using wet-to-dry saline dressings. He currently denies signs of infection. [place] note from 1/17; Patient presents with a 57-month history of nonhealing ulcer to the left foot secondary to diabetes and inability to offload well the area.  He has had multiple debridements in the past to his feet bilaterally. He has had resection of the fourth left met head in the fifth toe Secondary to osteomyelitis. We discussed the importance of glycemic control for wound healing. Due to his blood glucose levels being elevated he is at high risk for infection and thus further amputation. He expressed understanding. He states he is supposed to be referred to an endocrinologist at [facility] however the referral fell through. Unclear what happened. Offered a referral to endocrinology at [facility]. Patient was agreeable.Furthermore we discussed the importance of aggressive offloading for his wound healing. This will be the most difficult part of the treatment plan for the patient to do. We discussed a total contact cast however he has declined that at this time. 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 He is currently using a surgical shoe with a peg assist. It does not fit well so we will give him a new one today. I do not think this is enough  offloading. If he is not able to offload this area he will likely end up with a BKA. He is well aware of this. For now I recommended Medihoney and Hydrofera Blue for dressing changes. He will follow-up in our [place] office Since this is closer for him. [name], [name] ([id number]) [id number] Page 7 of 12 1/26; patient presents for follow-up. Last clinic visit I had seen him in [place] and we transferred him to [place] since this is a closer location for him. Progress note above from that visit. Over the past week He has been using Medihoney and Hydrofera Blue to the wound bed. He has been using his surgical shoe with peg assist. He has no issues or complaints today. We discussed doing the total contact cast and he was agreeable to having this placed at next clinic visit. 2/2; patient presents for follow-up. He  has been using Vashe wet-to-dry dressings. Plan is for the total contact cast today. 2/5; patient presents for follow-up. The cast was placed 3 days ago. He tolerated this well however had a lot of drainage. He currently denies signs of infection. 2/9; patient presents for follow-up. At last clinic visit we held off on replacing the cast due to drainage. He has been using Vashe wet-to-dry dressings and he is currently taking the oral antibiotics prescribed without issues. He declines a total contact cast today. We ran insurance verification for skin substitute and due to cost patient declines having this placed. 2/16; patient presents for follow-up. He has decided not to follow-up with podiatry. He continues to decline the total contact cast. He has been using Vashe wet- to-dry dressings. He has not been wearing his surgical shoe with peg assist. He currently denies signs of infection. 3/1; patient presents for follow-up. He has been doing Vashe wet-to-dry dressings. He has a surgical shoe with peg assist. We discussed potentially doing a skin substitute for which she has been approved for by insurance. He knows the out-of-pocket cost of this and would still like to proceed with having this placed today. He denies the current total contact cast. 3/8; patient presents for follow-up. He states that the skin substitute came off after a few days and he has been using Vashe wet-to-dry dressings. He has mild odor to the wound bed on exam. 3/15; patient presents for follow-up. He has been using Vashe wet-to-dry dressings and started his course of Augmentin. He reports improvement in wound healing. He has no issues or complaints today. 3/21; patient presents for follow-up. He continues to use Vashe wet-to-dry dressings. He has no issues or complaints today. 3/28; patient presents for follow-up. He has been using Vashe wet-to-dry dressings. He has no issues or complaints today. He has been using his peg  assist with surgical shoe. 4/11; patient presents for follow-up. He has been using Vashe wet-to-dry dressings. The periwound is macerated. He has been using his peg assist with surgical shoe. He has no issues or complaints today. 4/25; patient presents for follow-up. Has been using Vashe wet-to-dry dressings. He has been using zinc oxide to the periwound. For offloading he has been using his peg assist in surgical shoe. He has no issues or complaints today. 5/9; patient presents for follow-up. He has been using Vashe wet-to-dry dressings. He has no issues or complaints today. He has a surgical shoe with peg assist for offloading. 5/23; patient presents for follow up. He has been using Vashe to clean the wound bed and collagen and blast X with dressing changes. [place]  [place] the [place] 2 [place]. Wound is bigger. 5/31; patient presents for follow-up. He has been using Vashe wet-to-dry dressings. Wound is stable but with healthier granulation tissue. Patient would like to hold off on the total contact cast and try a knee scooter for offloading. 6/13; patient presents for follow-up. He has been using Vashe wet-to-dry dressings. He has been using a knee scooter for offloading however states he felt unstable on this and decided to buy an electric scooter. This arrived in the mail today. He would like to hold off on the left total contact cast. Wound is slightly smaller today. 6/27; patient presents for follow-up. He has been using Vashe wet-to-dry dressings. He is using an Art gallery manager for mobility to help relieve the pressure off the wound bed. Wound is smaller today. He wants to hold off on doing the total contact cast as he is showing signs of improvement with wound healing. 7/11; patient presents for follow-up. He has been using Vashe wet-to-dry dressings and his electric scooter for offloading. Wound is slightly smaller. 7/25; patient presents for follow up. He has been using Vashe wet-to-dry  dressings and he reports using his electric scooter for offloading. Wound is larger. 7/30; patient presents for follow-up. He has been using Vashe wet-to-dry dressings. He has taken Augmentin as prescribed without issues. Overall wound has healthier granulation tissue. 8/1; patient presents for follow-up. He presents for obligatory cast change. He tolerated the cast well and has no issues or complaints today. We have been using antibiotic ointment with Hydrofera Blue under the cast. Wound is slightly smaller. 8/8; patient presents for follow-up. We have been using antibiotic ointment with Hydrofera Blue under the total contact cast. Wound is smaller. He has no issues or complaints today. 8/13; patient presents for follow-up. We have been using antibiotic ointment with Hydrofera Blue under the total contact cast. He comes in today because the top part of the cast over the foot has sunken as well as on the side. He wanted to assure that there were no issues. 8/22; patient presents for follow-up. He has been using Hydrofera Blue to the wound bed. Previously we have been using a total contact cast. We do not have total contact casts due to manufacturing issues available for patients. He has no issues or complaints. He denies signs of infection. 8/27; patient presents for follow-up. He has been using Vashe wet-to-dry dressings. T contact cast is available for placement today. He was agreeable to otal proceed with this. He denies signs of infection. 9/3; patient presents for follow-up. We have been using Hydrofera Blue with antibiotic ointment under the total contact cast for the past week. He has tolerated this well. The wound is smaller. Unfortunately we do not have a total contact cast in office today for replacement as there is been manufacturing issues. 9/5; patient was seen earlier this week and plan was for a total contact cast but there is none available. The cast finally arrived in patient has  come in today to have this placed. We will continue with Hydrofera Blue and antibiotic ointment under the cast. 9/12; patient presents for follow-up. We have been using Hydrofera Blue and antibiotic ointment under the cast. Wound is stable. He denies signs of infection. Patient History Information obtained from Patient. Family History Heart Disease - Mother,Father, No family history of Cancer, Diabetes, Hereditary Spherocytosis, Hypertension, Kidney Disease, Lung Disease, Seizures, Stroke, Thyroid Problems, [name], [name] ([id number]) [id number] Page 8 of 12 Tuberculosis. Social History Never smoker, Marital  Status - Single, Alcohol Use - Rarely, Drug Use - No History, Caffeine Use - Rarely. 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 17 Denies history of Angina, Arrhythmia, Deep Vein Thrombosis, Hypotension, Peripheral Arterial Disease, Peripheral Venous Disease, Phlebitis, Vasculitis Gastrointestinal Denies history of Cirrhosis , Colitis, Crohns, Hepatitis A, Hepatitis B, Hepatitis C Endocrine Patient has history of Type II Diabetes Genitourinary Denies history of End Stage Renal Disease Immunological Denies history of Lupus Erythematosus, Raynauds, Scleroderma Integumentary (Skin) Denies history of History of Burn Musculoskeletal Denies history of Gout, Rheumatoid Arthritis, Osteoarthritis,  Osteomyelitis Neurologic Patient has history of Neuropathy Denies history of Dementia, Quadriplegia, Paraplegia, Seizure Disorder Oncologic Denies history of Received Chemotherapy, Received Radiation Psychiatric Denies history of Anorexia/bulimia, Confinement Anxiety Hospitalization/Surgery History - Heart Cath in 2018. Medical A Surgical History Notes nd Cardiovascular Ischemic Cardiomyopathy Genitourinary Renal Insufficiency Objective Constitutional respirations regular, non-labored and within target range for patient.. Vitals Time Taken: 9:55 AM, Height: 78 in, Weight: 275 lbs, BMI: 31.8, Temperature: 98 F, Pulse: 83 bpm, Respiratory Rate: 16 breaths/min, Blood Pressure: 110/76 mmHg. Cardiovascular 2+ dorsalis pedis/posterior tibialis pulses. Psychiatric pleasant and cooperative. General Notes: Left foot: T the lateral aspect fifth met head there is an open wound with granulation tissue and non viable tissue. No erythema or increased o warmth to the periwound. Integumentary (Hair, Skin) Wound #3 status is Open. Original cause of wound was Gradually Appeared. The date acquired was: 10/01/2022. The wound has been in treatment 32 weeks. The wound is located on the Left,Plantar Foot. The wound measures 1cm length x 0.7cm width x 0.2cm depth; 0.55cm^2 area and 0.11cm^3 volume. There is Fat Layer (Subcutaneous Tissue) exposed. There is no tunneling or undermining noted. There is a medium amount of serosanguineous drainage noted. The wound margin is thickened. There is large (67-100%) red, pink granulation within the wound bed. There is no necrotic tissue within the wound bed. The periwound skin appearance exhibited: Callus. The periwound skin appearance did not exhibit: Crepitus, Excoriation, Induration, Rash, Scarring, Dry/Scaly, Maceration, Atrophie Blanche, Cyanosis, Ecchymosis, Hemosiderin Staining, Mottled, Pallor, Rubor, Erythema. Periwound temperature was noted as No  Abnormality. Assessment [address] [name], [name] ([id number]) [id number] Page 9 of 12 ICD-10 Non-pressure chronic ulcer of other part of left foot with fat layer exposed Type 2 diabetes mellitus with foot ulcer Acquired absence of other left toe(s) Patient's wound is stable. I debrided nonviable tissue. I recommended continue the course with antibiotic ointment and Hydrofera Blue under the total contact cast. Follow-up in 1 week. Procedures Wound #3 Pre-procedure diagnosis of Wound #3 is a Diabetic Wound/Ulcer of the Lower Extremity located on the Left,Plantar Foot .Severity of Tissue Pre Debridement is: Fat layer exposed. There was a Excisional Skin/Subcutaneous Tissue Debridement with a total area of 0.55 sq cm performed by Geralyn Corwin, DO. With the following instrument(s): Curette to remove Viable and Non-Viable tissue/material. Material removed includes Callus, Subcutaneous Tissue, and Slough after achieving pain control using Lidocaine 4% Topical Solution. No specimens were taken. A time out was conducted at 10:40, prior to the start of the procedure. A Minimum amount of bleeding was controlled with Pressure. The procedure was tolerated well  with a pain level of 0 throughout and a pain level of 0 following the procedure. Post Debridement Measurements: 1cm length x 0.7cm width x 0.2cm depth; 0.11cm^3 volume. Character of Wound/Ulcer Post Debridement is improved. Severity of Tissue Post Debridement is: Fat layer exposed. Post procedure Diagnosis Wound #3: Same as Pre-Procedure Pre-procedure diagnosis of Wound #3 is a Diabetic Wound/Ulcer of the Lower Extremity located on the Left,Plantar Foot . There was a T Research scientist (life sciences) otal Procedure by Geralyn Corwin, DO. Post procedure Diagnosis Wound #3: Same as Pre-Procedure Plan Follow-up Appointments: Return Appointment in 1 week. - Dr. Lady Gary 09/17/2023 at 7:45am Return Appointment in 2 weeks. - Dr.  Lady Gary please scheduled for patient. Return appointment in 3 weeks. - Dr. Lady Gary please scheduled for patient. Anesthetic: (In clinic) Topical Lidocaine 4% applied to wound bed Cellular or Tissue Based Products: Cellular or Tissue Based Product Type: - apligraf and grafix 50% coinsurance 02/28/23-apply 1st Grafix 03/07/2023 HOLD THIS WEEK. Bathing/ Shower/ Hygiene: May shower with protection but do not get wound dressing(s) wet. Protect dressing(s) with water repellant cover (for example, large plastic bag) or a cast cover and may then take shower. Off-Loading: T Contact Cast to Left Lower Extremity - TCC size 3 heel cup to toes. foam pad to anterior ankle- applied 08/26/2023 otal WOUND #3: - Foot Wound Laterality: Plantar, Left Cleanser: Soap and Water 1 x Per Day/30 Days Discharge Instructions: May shower and wash wound with dial antibacterial soap and water prior to dressing change. Cleanser: Vashe 5.8 (oz) 1 x Per Day/30 Days Discharge Instructions: Cleanse the wound with Vashe prior to applying a clean dressing using gauze sponges, not tissue or cotton balls. Topical: Gentamicin 1 x Per Day/30 Days Discharge Instructions: As directed by physician Topical: Mupirocin Ointment 1 x Per Day/30 Days Discharge Instructions: Apply Mupirocin (Bactroban) as instructed Prim Dressing: Hydrofera Blue Ready Transfer Foam, 2.5x2.5 (in/in) 1 x Per Day/30 Days ary Discharge Instructions: Apply directly to wound bed as directed Prim Dressing: Optifoam Non-Adhesive Dressing, 4x4 in 1 x Per Day/30 Days ary Discharge Instructions: Apply to ankles and midfoot Prim Dressing: Heel cup 1 x Per Day/30 Days ary Discharge Instructions: cover toes prior to casting Prim Dressing: TCC #3 1 x Per Day/30 Days ary Secondary Dressing: ABD Pad, 5x9 1 x Per Day/30 Days Secured With: Kerlix Roll Sterile, 4.5x3.1 (in/yd) 1 x Per Day/30 Days Discharge Instructions: Secure with Kerlix as directed. Secured With: 66M  Medipore H Soft Cloth Surgical T ape, 4 x 10 (in/yd) 1 x Per Day/30 Days Discharge Instructions: Secure with tape as directed. 1. In office sharp debridement 2. Hydrofera Blue and antibiotic ointment 3. T contact cast placed in standard fashion to the left lower extremity otal Electronic Signature(s) Signed: 09/11/2023 3:50:21 PM By: Geralyn Corwin DO Entered By: Geralyn Corwin on 09/11/2023 12:47:19 [name] ([id number]) [id number] Page 10 of 12 -------------------------------------------------------------------------------- HxROS Details Patient Name: Date of Service: [name] [place], [name] RO LD 09/11/2023 9:30 A M Medical Record Number: [id number] Patient Account Number: [id number] Date of Birth/Sex: Treating RN: 12-09-1971 (52 y.o. M) Primary Care Provider: [name] Other Clinician: Referring Provider: Treating Provider/Extender: [name] in Treatment: 32 Information Obtained From Patient Eyes Medical History: Negative for: Cataracts; Glaucoma; Optic Neuritis Ear/Nose/Mouth/Throat Medical History: Positive for: Chronic sinus problems/congestion Negative for: Middle ear problems Hematologic/Lymphatic Medical History: Negative for: Anemia; Hemophilia; Human Immunodeficiency Virus; Lymphedema; Sickle Cell Disease Respiratory Medical History: Negative for: Aspiration; Asthma; Chronic Obstructive Pulmonary Disease (COPD); Pneumothorax; Sleep  Apnea; Tuberculosis Cardiovascular Medical History: Positive for: Congestive Heart Failure; Coronary Artery Disease; Hypertension; Myocardial Infarction - age 65 Negative for: Angina; Arrhythmia; Deep Vein Thrombosis; Hypotension; Peripheral Arterial Disease; Peripheral Venous Disease; Phlebitis; Vasculitis Past Medical History Notes: Ischemic Cardiomyopathy Gastrointestinal Medical History: Negative for: Cirrhosis ; Colitis; Crohns; Hepatitis A; Hepatitis B; Hepatitis  C Endocrine Medical History: Positive for: Type II Diabetes Time with diabetes: ten years Treated with: Insulin Blood sugar tested every day: No Genitourinary Medical History: Negative for: End Stage Renal Disease Past Medical History Notes: Renal Insufficiency Immunological Medical History: Negative for: Lupus Erythematosus; Raynauds; Scleroderma Integumentary (Skin) Medical History: Negative for: History of Burn HOOD, HATTON ([id number]) [id number] Page 11 of 12 Musculoskeletal Medical History: Negative for: Gout; Rheumatoid Arthritis; Osteoarthritis; Osteomyelitis Neurologic Medical History: Positive for: Neuropathy Negative for: Dementia; Quadriplegia; Paraplegia; Seizure Disorder Oncologic Medical History: Negative for: Received Chemotherapy; Received Radiation Psychiatric Medical History: Negative for: Anorexia/bulimia; Confinement Anxiety HBO Extended History Items Ear/Nose/Mouth/Throat: Chronic sinus problems/congestion Immunizations Pneumococcal Vaccine: Received Pneumococcal Vaccination: No Implantable Devices None Hospitalization / Surgery History Type of Hospitalization/Surgery Heart Cath in 2018 Family and Social History Cancer: No; Diabetes: No; Heart Disease: Yes - Mother,Father; Hereditary Spherocytosis: No; Hypertension: No; Kidney Disease: No; Lung Disease: No; Seizures: No; Stroke: No; Thyroid Problems: No; Tuberculosis: No; Never smoker; Marital Status - Single; Alcohol Use: Rarely; Drug Use: No History; Caffeine Use: Rarely; Financial Concerns: No; Food, Clothing or Shelter Needs: No; Support System Lacking: No; Transportation Concerns: No Electronic Signature(s) Signed: 09/11/2023 3:50:21 PM By: Geralyn Corwin DO Entered By: Geralyn Corwin on 09/11/2023 12:44:59 -------------------------------------------------------------------------------- Total Contact Cast Details Patient Name: Date of Service: [name]  [place], [name] RO LD 09/11/2023 9:30 A M Medical Record Number: [id number] Patient Account Number: [id number] Date of Birth/Sex: Treating RN: December 19, 1971 (52 y.o. [name] Primary Care Provider: [name] Other Clinician: Referring Provider: Treating Provider/Extender: [name] in Treatment: 32 T Contact Cast Applied for Wound Assessment: otal Wound #3 Left,Plantar Foot Performed By: Physician Geralyn Corwin, DO Post Procedure Diagnosis Same as Pre-procedure Electronic Signature(s) Signed: 09/11/2023 1:00:45 PM By: Karie Schwalbe RN Signed: 09/11/2023 3:50:21 PM By: Geralyn Corwin DO Entered By: Karie Schwalbe on 09/11/2023 10:25:27 [name] ([id number]) [id number] Page 12 of 12 -------------------------------------------------------------------------------- SuperBill Details Patient Name: Date of Service: [name] [place], [name] RO LD 09/11/2023 Medical Record Number: [id number] Patient Account Number: [id number] Date of Birth/Sex: Treating RN: 04/13/1971 (52 y.o. M) Primary Care Provider: [name] Other Clinician: Referring Provider: Treating Provider/Extender: [name] in Treatment: 32 Diagnosis Coding ICD-10 Codes Code Description [phone] 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.621 Type 2 diabetes mellitus with foot ulcer Z89.422 Acquired absence of other left toe(s) Facility Procedures : CPT4 Code: [id number] Description: 11042 - DEB SUBQ TISSUE 20 SQ CM/< ICD-10 Diagnosis Description L97.522 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.621 Type 2 diabetes mellitus with foot ulcer Modifier: Quantity: 1 Physician Procedures : CPT4 Code Description Modifier [id number] 11042 - WC PHYS SUBQ TISS 20 SQ CM ICD-10 Diagnosis Description L97.522 Non-pressure chronic ulcer of other part of left foot with fat  layer exposed E11.621 Type 2 diabetes mellitus with foot ulcer Quantity: 1 Electronic Signature(s) Signed: 09/11/2023 3:50:21 PM By: Geralyn Corwin DO Entered By: Geralyn Corwin on 09/11/2023 12:47:29

## 2023-09-15 ENCOUNTER — Other Ambulatory Visit: Payer: Self-pay

## 2023-09-17 ENCOUNTER — Encounter (HOSPITAL_BASED_OUTPATIENT_CLINIC_OR_DEPARTMENT_OTHER): Payer: 59 | Admitting: General Surgery

## 2023-09-17 DIAGNOSIS — E785 Hyperlipidemia, unspecified: Secondary | ICD-10-CM | POA: Diagnosis not present

## 2023-09-17 DIAGNOSIS — I2510 Atherosclerotic heart disease of native coronary artery without angina pectoris: Secondary | ICD-10-CM | POA: Diagnosis not present

## 2023-09-17 DIAGNOSIS — N189 Chronic kidney disease, unspecified: Secondary | ICD-10-CM | POA: Diagnosis not present

## 2023-09-17 DIAGNOSIS — E1140 Type 2 diabetes mellitus with diabetic neuropathy, unspecified: Secondary | ICD-10-CM | POA: Diagnosis not present

## 2023-09-17 DIAGNOSIS — I5042 Chronic combined systolic (congestive) and diastolic (congestive) heart failure: Secondary | ICD-10-CM | POA: Diagnosis not present

## 2023-09-17 DIAGNOSIS — E1122 Type 2 diabetes mellitus with diabetic chronic kidney disease: Secondary | ICD-10-CM | POA: Diagnosis not present

## 2023-09-17 DIAGNOSIS — Z794 Long term (current) use of insulin: Secondary | ICD-10-CM | POA: Diagnosis not present

## 2023-09-17 DIAGNOSIS — I130 Hypertensive heart and chronic kidney disease with heart failure and stage 1 through stage 4 chronic kidney disease, or unspecified chronic kidney disease: Secondary | ICD-10-CM | POA: Diagnosis not present

## 2023-09-17 DIAGNOSIS — E11621 Type 2 diabetes mellitus with foot ulcer: Secondary | ICD-10-CM | POA: Diagnosis not present

## 2023-09-17 DIAGNOSIS — L97522 Non-pressure chronic ulcer of other part of left foot with fat layer exposed: Secondary | ICD-10-CM | POA: Diagnosis not present

## 2023-09-17 NOTE — Progress Notes (Signed)
slough. The wound bed was prepared for total contact casting. Topical gentamicin and mupirocin were applied, followed by [facility] ready. A total contact cast CEDRIK, GUYTON ([id number]) [id number] Page 9 of 11 was then applied in standard fashion. Follow-up in 1 week. Electronic Signature(s) Signed: 09/17/2023 9:14:10 AM By: [name] MD FACS Signed: 09/17/2023 4:22:42 PM By: [name] Previous Signature: 09/17/2023 8:07:56 AM Version By: [name] MD FACS Entered By: [name] on 09/17/2023 05:22:53 -------------------------------------------------------------------------------- HxROS Details Patient Name: Date of Service: [name] IN, [name] 09/17/2023 7:45 A M Medical Record Number: [id number] Patient Account Number: [id number] Date of Birth/Sex: Treating RN: 05-Jul-1971 (52 y.o. M) Primary Care Provider: [name] Other Clinician: Referring Provider: Treating Provider/Extender: [name] in Treatment: 33 Information Obtained From Patient Eyes Medical History: Negative for: Cataracts; Glaucoma; Optic Neuritis Ear/Nose/Mouth/Throat Medical History: Positive for: Chronic sinus  problems/congestion Negative for: Middle ear problems Hematologic/Lymphatic Medical History: Negative for: Anemia; Hemophilia; Human Immunodeficiency Virus; Lymphedema; Sickle Cell Disease Respiratory Medical History: Negative for: Aspiration; Asthma; Chronic Obstructive Pulmonary Disease (COPD); Pneumothorax; Sleep Apnea; Tuberculosis Cardiovascular Medical History: Positive for: Congestive Heart Failure; Coronary Artery Disease; Hypertension; Myocardial Infarction - age 51 Negative for: Angina; Arrhythmia; Deep Vein Thrombosis; Hypotension; Peripheral Arterial Disease; Peripheral Venous Disease; Phlebitis; Vasculitis Past Medical History Notes: Ischemic Cardiomyopathy Gastrointestinal Medical History: Negative for: Cirrhosis ; Colitis; Crohns; Hepatitis A; Hepatitis B; Hepatitis C Endocrine Medical History: Positive for: Type II Diabetes Time with diabetes: ten years Treated with: Insulin Blood sugar tested every day: No Genitourinary Medical History: Negative for: End Stage Renal Disease Past Medical History Notes: Renal Insufficiency [name] ([id number]) [id number] Page 10 of 11 Immunological Medical History: Negative for: Lupus Erythematosus; Raynauds; Scleroderma Integumentary (Skin) Medical History: Negative for: History of Burn Musculoskeletal Medical History: Negative for: Gout; Rheumatoid Arthritis; Osteoarthritis; Osteomyelitis Neurologic Medical History: Positive for: Neuropathy Negative for: Dementia; Quadriplegia; Paraplegia; Seizure Disorder Oncologic Medical History: Negative for: Received Chemotherapy; Received Radiation Psychiatric Medical History: Negative for: Anorexia/bulimia; Confinement Anxiety HBO Extended History Items Ear/Nose/Mouth/Throat: Chronic sinus problems/congestion Immunizations Pneumococcal Vaccine: Received Pneumococcal Vaccination: No Implantable Devices None Hospitalization / Surgery  History Type of Hospitalization/Surgery Heart Cath in 2018 Family and Social History Cancer: No; Diabetes: No; Heart Disease: Yes - Mother,Father; Hereditary Spherocytosis: No; Hypertension: No; Kidney Disease: No; Lung Disease: No; Seizures: No; Stroke: No; Thyroid Problems: No; Tuberculosis: No; Never smoker; Marital Status - Single; Alcohol Use: Rarely; Drug Use: No History; Caffeine Use: Rarely; Financial Concerns: No; Food, Clothing or Shelter Needs: No; Support System Lacking: No; Transportation Concerns: No Electronic Signature(s) Signed: 09/17/2023 9:14:10 AM By: [name] MD FACS Entered By: [name] on 09/17/2023 05:06:32 -------------------------------------------------------------------------------- Total Contact Cast Details Patient Name: Date of Service: [name] IN, [name] 09/17/2023 7:45 A M Medical Record Number: [id number] Patient Account Number: [id number] Date of Birth/Sex: Treating RN: 12-Sep-1971 (52 y.o. [name] Primary Care Provider: [name] Other Clinician: Referring Provider: Treating Provider/Extender: [name] in Treatment: 34 T Contact Cast Applied for Wound Assessment: otal Wound #3 Left,Plantar Foot Performed By: Physician [name], MD The following information was scribed by: Candiss Norse, [name] ([id number]) [id number] Page 11 of 11 The information was scribed for: [name] Post Procedure Diagnosis Same as Pre-procedure Electronic Signature(s) Signed: 09/17/2023 9:14:10 AM By: [name] MD FACS Signed: 09/17/2023 4:22:42 PM By: [name] Entered By: [name] on 09/17/2023 05:00:01 -------------------------------------------------------------------------------- SuperBill Details Patient Name: Date of Service: [name]  IN, [name] 09/17/2023 Medical Record Number: [id number] Patient Account Number:  [id number] Date of Birth/Sex: Treating RN: January 26, 1971 (52 y.o. 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) Facility Procedures : CPT4 Code: 66440347 Description: [phone] - APPLY TOTAL CONTACT LEG CAST ICD-10 Diagnosis Description L97.522 Non-pressure chronic ulcer of other part of left foot with fat layer exposed Modifier: Quantity: 1 Physician Procedures : CPT4 Code Description Modifier [id number] - WC PHYS LEVEL 4 - EST PT 25 ICD-10 Diagnosis Description L97.522 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.621 Type 2 diabetes mellitus with foot ulcer Z89.422  Acquired absence of other left toe(s) Quantity: 1 : [phone] [id number] - WC PHYS APPLY TOTAL CONTACT CAST ICD-10 Diagnosis Description L97.522 Non-pressure chronic ulcer of other part of left foot with fat layer exposed Quantity: 1 Electronic Signature(s) Signed: 09/17/2023 8:09:47 AM By: [name] MD FACS Entered By: [name] on 09/17/2023 05:09:47  for mobility to help relieve the pressure off the wound bed. Wound is smaller today. He wants to hold off on doing the total contact cast as he is showing signs of improvement with wound healing. 7/11; patient presents for follow-up. He has been using Vashe wet-to-dry dressings and his electric scooter for offloading. Wound is slightly smaller. 7/25; patient presents for follow up. He has been using Vashe wet-to-dry dressings and he reports using his electric scooter for offloading. Wound is larger. 7/30; patient presents for follow-up. He has been using Vashe wet-to-dry dressings. He has taken Augmentin as prescribed without issues. Overall wound has healthier granulation tissue. 8/1; patient presents for follow-up. He presents for obligatory cast change. He tolerated the cast well and has no issues or complaints today. We have been using antibiotic ointment with Hydrofera Blue under the cast. Wound is  slightly smaller. 8/8; patient presents for follow-up. We have been using antibiotic ointment with Hydrofera Blue under the total contact cast. Wound is smaller. He has no issues or complaints today. 8/13; patient presents for follow-up. We have been using antibiotic ointment with Hydrofera Blue under the total contact cast. He comes in today because the top part of the cast over the foot has sunken as well as on the side. He wanted to assure that there were no issues. DELAWRENCE, PORTIS ([id number]) [id number] Page 3 of 11 8/22; patient presents for follow-up. He has been using Hydrofera Blue to the wound bed. Previously we have been using a total contact cast. We do not have total contact casts due to manufacturing issues available for patients. He has no issues or complaints. He denies signs of infection. 8/27; patient presents for follow-up. He has been using Vashe wet-to-dry dressings. T contact cast is available for placement today. He was agreeable to otal proceed with this. He denies signs of infection. 9/3; patient presents for follow-up. We have been using Hydrofera Blue with antibiotic ointment under the total contact cast for the past week. He has tolerated this well. The wound is smaller. Unfortunately we do not have a total contact cast in office today for replacement as there is been manufacturing issues. 9/5; patient was seen earlier this week and plan was for a total contact cast but there is none available. The cast finally arrived in patient has come in today to have this placed. We will continue with Hydrofera Blue and antibiotic ointment under the cast. 9/12; patient presents for follow-up. We have been using Hydrofera Blue and antibiotic ointment under the cast. Wound is stable. He denies signs of infection. 09/17/2023: The wound is measuring smaller today. The surface appears healthy and there is minimal accumulation of slough. Electronic  Signature(s) Signed: 09/17/2023 8:06:27 AM By: [name] MD FACS Entered By: [name] on 09/17/2023 05:06:26 -------------------------------------------------------------------------------- Physical Exam Details Patient Name: Date of Service: [name] IN, [name] 09/17/2023 7:45 A M Medical Record Number: [id number] Patient Account Number: [id number] Date of Birth/Sex: Treating RN: 07-19-1971 (52 y.o. M) Primary Care Provider: [name] Other Clinician: Referring Provider: Treating Provider/Extender: [name] in Treatment: 33 Constitutional Slightly hypertensive. . . . no acute distress. Respiratory Normal work of breathing on room air. Notes 09/17/2023: The wound is measuring smaller today. The surface appears healthy and there is minimal accumulation of slough. Electronic Signature(s) Signed: 09/17/2023 8:07:01 AM By: [name] MD FACS Entered By: [name] on 09/17/2023 05:07:01 -------------------------------------------------------------------------------- Physician Orders Details Patient Name: Date of Service: [name] IN, HA  for mobility to help relieve the pressure off the wound bed. Wound is smaller today. He wants to hold off on doing the total contact cast as he is showing signs of improvement with wound healing. 7/11; patient presents for follow-up. He has been using Vashe wet-to-dry dressings and his electric scooter for offloading. Wound is slightly smaller. 7/25; patient presents for follow up. He has been using Vashe wet-to-dry dressings and he reports using his electric scooter for offloading. Wound is larger. 7/30; patient presents for follow-up. He has been using Vashe wet-to-dry dressings. He has taken Augmentin as prescribed without issues. Overall wound has healthier granulation tissue. 8/1; patient presents for follow-up. He presents for obligatory cast change. He tolerated the cast well and has no issues or complaints today. We have been using antibiotic ointment with Hydrofera Blue under the cast. Wound is  slightly smaller. 8/8; patient presents for follow-up. We have been using antibiotic ointment with Hydrofera Blue under the total contact cast. Wound is smaller. He has no issues or complaints today. 8/13; patient presents for follow-up. We have been using antibiotic ointment with Hydrofera Blue under the total contact cast. He comes in today because the top part of the cast over the foot has sunken as well as on the side. He wanted to assure that there were no issues. DELAWRENCE, PORTIS ([id number]) [id number] Page 3 of 11 8/22; patient presents for follow-up. He has been using Hydrofera Blue to the wound bed. Previously we have been using a total contact cast. We do not have total contact casts due to manufacturing issues available for patients. He has no issues or complaints. He denies signs of infection. 8/27; patient presents for follow-up. He has been using Vashe wet-to-dry dressings. T contact cast is available for placement today. He was agreeable to otal proceed with this. He denies signs of infection. 9/3; patient presents for follow-up. We have been using Hydrofera Blue with antibiotic ointment under the total contact cast for the past week. He has tolerated this well. The wound is smaller. Unfortunately we do not have a total contact cast in office today for replacement as there is been manufacturing issues. 9/5; patient was seen earlier this week and plan was for a total contact cast but there is none available. The cast finally arrived in patient has come in today to have this placed. We will continue with Hydrofera Blue and antibiotic ointment under the cast. 9/12; patient presents for follow-up. We have been using Hydrofera Blue and antibiotic ointment under the cast. Wound is stable. He denies signs of infection. 09/17/2023: The wound is measuring smaller today. The surface appears healthy and there is minimal accumulation of slough. Electronic  Signature(s) Signed: 09/17/2023 8:06:27 AM By: [name] MD FACS Entered By: [name] on 09/17/2023 05:06:26 -------------------------------------------------------------------------------- Physical Exam Details Patient Name: Date of Service: [name] IN, [name] 09/17/2023 7:45 A M Medical Record Number: [id number] Patient Account Number: [id number] Date of Birth/Sex: Treating RN: 07-19-1971 (52 y.o. M) Primary Care Provider: [name] Other Clinician: Referring Provider: Treating Provider/Extender: [name] in Treatment: 33 Constitutional Slightly hypertensive. . . . no acute distress. Respiratory Normal work of breathing on room air. Notes 09/17/2023: The wound is measuring smaller today. The surface appears healthy and there is minimal accumulation of slough. Electronic Signature(s) Signed: 09/17/2023 8:07:01 AM By: [name] MD FACS Entered By: [name] on 09/17/2023 05:07:01 -------------------------------------------------------------------------------- Physician Orders Details Patient Name: Date of Service: [name] IN, HA  IN, [name] 09/17/2023 Medical Record Number: [id number] Patient Account Number:  [id number] Date of Birth/Sex: Treating RN: January 26, 1971 (52 y.o. 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) Facility Procedures : CPT4 Code: 66440347 Description: [phone] - APPLY TOTAL CONTACT LEG CAST ICD-10 Diagnosis Description L97.522 Non-pressure chronic ulcer of other part of left foot with fat layer exposed Modifier: Quantity: 1 Physician Procedures : CPT4 Code Description Modifier [id number] - WC PHYS LEVEL 4 - EST PT 25 ICD-10 Diagnosis Description L97.522 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.621 Type 2 diabetes mellitus with foot ulcer Z89.422  Acquired absence of other left toe(s) Quantity: 1 : [id number] - WC PHYS APPLY TOTAL CONTACT CAST ICD-10 Diagnosis Description L97.522 Non-pressure chronic ulcer of other part of left foot with fat layer exposed Quantity: 1 Electronic Signature(s) Signed: 09/17/2023 8:09:47 AM By: [name] MD FACS Entered By: [name] on 09/17/2023 05:09:47  slough. The wound bed was prepared for total contact casting. Topical gentamicin and mupirocin were applied, followed by [facility] ready. A total contact cast CEDRIK, GUYTON ([id number]) [id number] Page 9 of 11 was then applied in standard fashion. Follow-up in 1 week. Electronic Signature(s) Signed: 09/17/2023 9:14:10 AM By: [name] MD FACS Signed: 09/17/2023 4:22:42 PM By: [name] Previous Signature: 09/17/2023 8:07:56 AM Version By: [name] MD FACS Entered By: [name] on 09/17/2023 05:22:53 -------------------------------------------------------------------------------- HxROS Details Patient Name: Date of Service: [name] IN, [name] 09/17/2023 7:45 A M Medical Record Number: [id number] Patient Account Number: [id number] Date of Birth/Sex: Treating RN: 05-Jul-1971 (52 y.o. M) Primary Care Provider: [name] Other Clinician: Referring Provider: Treating Provider/Extender: [name] in Treatment: 33 Information Obtained From Patient Eyes Medical History: Negative for: Cataracts; Glaucoma; Optic Neuritis Ear/Nose/Mouth/Throat Medical History: Positive for: Chronic sinus  problems/congestion Negative for: Middle ear problems Hematologic/Lymphatic Medical History: Negative for: Anemia; Hemophilia; Human Immunodeficiency Virus; Lymphedema; Sickle Cell Disease Respiratory Medical History: Negative for: Aspiration; Asthma; Chronic Obstructive Pulmonary Disease (COPD); Pneumothorax; Sleep Apnea; Tuberculosis Cardiovascular Medical History: Positive for: Congestive Heart Failure; Coronary Artery Disease; Hypertension; Myocardial Infarction - age 51 Negative for: Angina; Arrhythmia; Deep Vein Thrombosis; Hypotension; Peripheral Arterial Disease; Peripheral Venous Disease; Phlebitis; Vasculitis Past Medical History Notes: Ischemic Cardiomyopathy Gastrointestinal Medical History: Negative for: Cirrhosis ; Colitis; Crohns; Hepatitis A; Hepatitis B; Hepatitis C Endocrine Medical History: Positive for: Type II Diabetes Time with diabetes: ten years Treated with: Insulin Blood sugar tested every day: No Genitourinary Medical History: Negative for: End Stage Renal Disease Past Medical History Notes: Renal Insufficiency [name] ([id number]) [id number] Page 10 of 11 Immunological Medical History: Negative for: Lupus Erythematosus; Raynauds; Scleroderma Integumentary (Skin) Medical History: Negative for: History of Burn Musculoskeletal Medical History: Negative for: Gout; Rheumatoid Arthritis; Osteoarthritis; Osteomyelitis Neurologic Medical History: Positive for: Neuropathy Negative for: Dementia; Quadriplegia; Paraplegia; Seizure Disorder Oncologic Medical History: Negative for: Received Chemotherapy; Received Radiation Psychiatric Medical History: Negative for: Anorexia/bulimia; Confinement Anxiety HBO Extended History Items Ear/Nose/Mouth/Throat: Chronic sinus problems/congestion Immunizations Pneumococcal Vaccine: Received Pneumococcal Vaccination: No Implantable Devices None Hospitalization / Surgery  History Type of Hospitalization/Surgery Heart Cath in 2018 Family and Social History Cancer: No; Diabetes: No; Heart Disease: Yes - Mother,Father; Hereditary Spherocytosis: No; Hypertension: No; Kidney Disease: No; Lung Disease: No; Seizures: No; Stroke: No; Thyroid Problems: No; Tuberculosis: No; Never smoker; Marital Status - Single; Alcohol Use: Rarely; Drug Use: No History; Caffeine Use: Rarely; Financial Concerns: No; Food, Clothing or Shelter Needs: No; Support System Lacking: No; Transportation Concerns: No Electronic Signature(s) Signed: 09/17/2023 9:14:10 AM By: [name] MD FACS Entered By: [name] on 09/17/2023 05:06:32 -------------------------------------------------------------------------------- Total Contact Cast Details Patient Name: Date of Service: [name] IN, [name] 09/17/2023 7:45 A M Medical Record Number: [id number] Patient Account Number: [id number] Date of Birth/Sex: Treating RN: 12-Sep-1971 (52 y.o. [name] Primary Care Provider: [name] Other Clinician: Referring Provider: Treating Provider/Extender: [name] in Treatment: 34 T Contact Cast Applied for Wound Assessment: otal Wound #3 Left,Plantar Foot Performed By: Physician [name], MD The following information was scribed by: Candiss Norse, [name] ([id number]) [id number] Page 11 of 11 The information was scribed for: [name] Post Procedure Diagnosis Same as Pre-procedure Electronic Signature(s) Signed: 09/17/2023 9:14:10 AM By: [name] MD FACS Signed: 09/17/2023 4:22:42 PM By: [name] Entered By: [name] on 09/17/2023 05:00:01 -------------------------------------------------------------------------------- SuperBill Details Patient Name: Date of Service: [name]  slough. The wound bed was prepared for total contact casting. Topical gentamicin and mupirocin were applied, followed by [facility] ready. A total contact cast CEDRIK, GUYTON ([id number]) [id number] Page 9 of 11 was then applied in standard fashion. Follow-up in 1 week. Electronic Signature(s) Signed: 09/17/2023 9:14:10 AM By: [name] MD FACS Signed: 09/17/2023 4:22:42 PM By: [name] Previous Signature: 09/17/2023 8:07:56 AM Version By: [name] MD FACS Entered By: [name] on 09/17/2023 05:22:53 -------------------------------------------------------------------------------- HxROS Details Patient Name: Date of Service: [name] IN, [name] 09/17/2023 7:45 A M Medical Record Number: [id number] Patient Account Number: [id number] Date of Birth/Sex: Treating RN: 05-Jul-1971 (52 y.o. M) Primary Care Provider: [name] Other Clinician: Referring Provider: Treating Provider/Extender: [name] in Treatment: 33 Information Obtained From Patient Eyes Medical History: Negative for: Cataracts; Glaucoma; Optic Neuritis Ear/Nose/Mouth/Throat Medical History: Positive for: Chronic sinus  problems/congestion Negative for: Middle ear problems Hematologic/Lymphatic Medical History: Negative for: Anemia; Hemophilia; Human Immunodeficiency Virus; Lymphedema; Sickle Cell Disease Respiratory Medical History: Negative for: Aspiration; Asthma; Chronic Obstructive Pulmonary Disease (COPD); Pneumothorax; Sleep Apnea; Tuberculosis Cardiovascular Medical History: Positive for: Congestive Heart Failure; Coronary Artery Disease; Hypertension; Myocardial Infarction - age 51 Negative for: Angina; Arrhythmia; Deep Vein Thrombosis; Hypotension; Peripheral Arterial Disease; Peripheral Venous Disease; Phlebitis; Vasculitis Past Medical History Notes: Ischemic Cardiomyopathy Gastrointestinal Medical History: Negative for: Cirrhosis ; Colitis; Crohns; Hepatitis A; Hepatitis B; Hepatitis C Endocrine Medical History: Positive for: Type II Diabetes Time with diabetes: ten years Treated with: Insulin Blood sugar tested every day: No Genitourinary Medical History: Negative for: End Stage Renal Disease Past Medical History Notes: Renal Insufficiency [name] ([id number]) [id number] Page 10 of 11 Immunological Medical History: Negative for: Lupus Erythematosus; Raynauds; Scleroderma Integumentary (Skin) Medical History: Negative for: History of Burn Musculoskeletal Medical History: Negative for: Gout; Rheumatoid Arthritis; Osteoarthritis; Osteomyelitis Neurologic Medical History: Positive for: Neuropathy Negative for: Dementia; Quadriplegia; Paraplegia; Seizure Disorder Oncologic Medical History: Negative for: Received Chemotherapy; Received Radiation Psychiatric Medical History: Negative for: Anorexia/bulimia; Confinement Anxiety HBO Extended History Items Ear/Nose/Mouth/Throat: Chronic sinus problems/congestion Immunizations Pneumococcal Vaccine: Received Pneumococcal Vaccination: No Implantable Devices None Hospitalization / Surgery  History Type of Hospitalization/Surgery Heart Cath in 2018 Family and Social History Cancer: No; Diabetes: No; Heart Disease: Yes - Mother,Father; Hereditary Spherocytosis: No; Hypertension: No; Kidney Disease: No; Lung Disease: No; Seizures: No; Stroke: No; Thyroid Problems: No; Tuberculosis: No; Never smoker; Marital Status - Single; Alcohol Use: Rarely; Drug Use: No History; Caffeine Use: Rarely; Financial Concerns: No; Food, Clothing or Shelter Needs: No; Support System Lacking: No; Transportation Concerns: No Electronic Signature(s) Signed: 09/17/2023 9:14:10 AM By: [name] MD FACS Entered By: [name] on 09/17/2023 05:06:32 -------------------------------------------------------------------------------- Total Contact Cast Details Patient Name: Date of Service: [name] IN, [name] 09/17/2023 7:45 A M Medical Record Number: [id number] Patient Account Number: [id number] Date of Birth/Sex: Treating RN: 12-Sep-1971 (52 y.o. [name] Primary Care Provider: [name] Other Clinician: Referring Provider: Treating Provider/Extender: [name] in Treatment: 34 T Contact Cast Applied for Wound Assessment: otal Wound #3 Left,Plantar Foot Performed By: Physician [name], MD The following information was scribed by: Candiss Norse, [name] ([id number]) [id number] Page 11 of 11 The information was scribed for: [name] Post Procedure Diagnosis Same as Pre-procedure Electronic Signature(s) Signed: 09/17/2023 9:14:10 AM By: [name] MD FACS Signed: 09/17/2023 4:22:42 PM By: [name] Entered By: [name] on 09/17/2023 05:00:01 -------------------------------------------------------------------------------- SuperBill Details Patient Name: Date of Service: [name]  IN, [name] 09/17/2023 Medical Record Number: [id number] Patient Account Number:  [id number] Date of Birth/Sex: Treating RN: January 26, 1971 (52 y.o. 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) Facility Procedures : CPT4 Code: 66440347 Description: [phone] - APPLY TOTAL CONTACT LEG CAST ICD-10 Diagnosis Description L97.522 Non-pressure chronic ulcer of other part of left foot with fat layer exposed Modifier: Quantity: 1 Physician Procedures : CPT4 Code Description Modifier [id number] - WC PHYS LEVEL 4 - EST PT 25 ICD-10 Diagnosis Description L97.522 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.621 Type 2 diabetes mellitus with foot ulcer Z89.422  Acquired absence of other left toe(s) Quantity: 1 : [phone] [id number] - WC PHYS APPLY TOTAL CONTACT CAST ICD-10 Diagnosis Description L97.522 Non-pressure chronic ulcer of other part of left foot with fat layer exposed Quantity: 1 Electronic Signature(s) Signed: 09/17/2023 8:09:47 AM By: [name] MD FACS Entered By: [name] on 09/17/2023 05:09:47  slough. The wound bed was prepared for total contact casting. Topical gentamicin and mupirocin were applied, followed by [facility] ready. A total contact cast CEDRIK, GUYTON ([id number]) [id number] Page 9 of 11 was then applied in standard fashion. Follow-up in 1 week. Electronic Signature(s) Signed: 09/17/2023 9:14:10 AM By: [name] MD FACS Signed: 09/17/2023 4:22:42 PM By: [name] Previous Signature: 09/17/2023 8:07:56 AM Version By: [name] MD FACS Entered By: [name] on 09/17/2023 05:22:53 -------------------------------------------------------------------------------- HxROS Details Patient Name: Date of Service: [name] IN, [name] 09/17/2023 7:45 A M Medical Record Number: [id number] Patient Account Number: [id number] Date of Birth/Sex: Treating RN: 05-Jul-1971 (52 y.o. M) Primary Care Provider: [name] Other Clinician: Referring Provider: Treating Provider/Extender: [name] in Treatment: 33 Information Obtained From Patient Eyes Medical History: Negative for: Cataracts; Glaucoma; Optic Neuritis Ear/Nose/Mouth/Throat Medical History: Positive for: Chronic sinus  problems/congestion Negative for: Middle ear problems Hematologic/Lymphatic Medical History: Negative for: Anemia; Hemophilia; Human Immunodeficiency Virus; Lymphedema; Sickle Cell Disease Respiratory Medical History: Negative for: Aspiration; Asthma; Chronic Obstructive Pulmonary Disease (COPD); Pneumothorax; Sleep Apnea; Tuberculosis Cardiovascular Medical History: Positive for: Congestive Heart Failure; Coronary Artery Disease; Hypertension; Myocardial Infarction - age 51 Negative for: Angina; Arrhythmia; Deep Vein Thrombosis; Hypotension; Peripheral Arterial Disease; Peripheral Venous Disease; Phlebitis; Vasculitis Past Medical History Notes: Ischemic Cardiomyopathy Gastrointestinal Medical History: Negative for: Cirrhosis ; Colitis; Crohns; Hepatitis A; Hepatitis B; Hepatitis C Endocrine Medical History: Positive for: Type II Diabetes Time with diabetes: ten years Treated with: Insulin Blood sugar tested every day: No Genitourinary Medical History: Negative for: End Stage Renal Disease Past Medical History Notes: Renal Insufficiency [name] ([id number]) [id number] Page 10 of 11 Immunological Medical History: Negative for: Lupus Erythematosus; Raynauds; Scleroderma Integumentary (Skin) Medical History: Negative for: History of Burn Musculoskeletal Medical History: Negative for: Gout; Rheumatoid Arthritis; Osteoarthritis; Osteomyelitis Neurologic Medical History: Positive for: Neuropathy Negative for: Dementia; Quadriplegia; Paraplegia; Seizure Disorder Oncologic Medical History: Negative for: Received Chemotherapy; Received Radiation Psychiatric Medical History: Negative for: Anorexia/bulimia; Confinement Anxiety HBO Extended History Items Ear/Nose/Mouth/Throat: Chronic sinus problems/congestion Immunizations Pneumococcal Vaccine: Received Pneumococcal Vaccination: No Implantable Devices None Hospitalization / Surgery  History Type of Hospitalization/Surgery Heart Cath in 2018 Family and Social History Cancer: No; Diabetes: No; Heart Disease: Yes - Mother,Father; Hereditary Spherocytosis: No; Hypertension: No; Kidney Disease: No; Lung Disease: No; Seizures: No; Stroke: No; Thyroid Problems: No; Tuberculosis: No; Never smoker; Marital Status - Single; Alcohol Use: Rarely; Drug Use: No History; Caffeine Use: Rarely; Financial Concerns: No; Food, Clothing or Shelter Needs: No; Support System Lacking: No; Transportation Concerns: No Electronic Signature(s) Signed: 09/17/2023 9:14:10 AM By: [name] MD FACS Entered By: [name] on 09/17/2023 05:06:32 -------------------------------------------------------------------------------- Total Contact Cast Details Patient Name: Date of Service: [name] IN, [name] 09/17/2023 7:45 A M Medical Record Number: [id number] Patient Account Number: [id number] Date of Birth/Sex: Treating RN: 12-Sep-1971 (52 y.o. [name] Primary Care Provider: [name] Other Clinician: Referring Provider: Treating Provider/Extender: [name] in Treatment: 34 T Contact Cast Applied for Wound Assessment: otal Wound #3 Left,Plantar Foot Performed By: Physician [name], MD The following information was scribed by: Candiss Norse, [name] ([id number]) [id number] Page 11 of 11 The information was scribed for: [name] Post Procedure Diagnosis Same as Pre-procedure Electronic Signature(s) Signed: 09/17/2023 9:14:10 AM By: [name] MD FACS Signed: 09/17/2023 4:22:42 PM By: [name] Entered By: [name] on 09/17/2023 05:00:01 -------------------------------------------------------------------------------- SuperBill Details Patient Name: Date of Service: [name]  for mobility to help relieve the pressure off the wound bed. Wound is smaller today. He wants to hold off on doing the total contact cast as he is showing signs of improvement with wound healing. 7/11; patient presents for follow-up. He has been using Vashe wet-to-dry dressings and his electric scooter for offloading. Wound is slightly smaller. 7/25; patient presents for follow up. He has been using Vashe wet-to-dry dressings and he reports using his electric scooter for offloading. Wound is larger. 7/30; patient presents for follow-up. He has been using Vashe wet-to-dry dressings. He has taken Augmentin as prescribed without issues. Overall wound has healthier granulation tissue. 8/1; patient presents for follow-up. He presents for obligatory cast change. He tolerated the cast well and has no issues or complaints today. We have been using antibiotic ointment with Hydrofera Blue under the cast. Wound is  slightly smaller. 8/8; patient presents for follow-up. We have been using antibiotic ointment with Hydrofera Blue under the total contact cast. Wound is smaller. He has no issues or complaints today. 8/13; patient presents for follow-up. We have been using antibiotic ointment with Hydrofera Blue under the total contact cast. He comes in today because the top part of the cast over the foot has sunken as well as on the side. He wanted to assure that there were no issues. DELAWRENCE, PORTIS ([id number]) [id number] Page 3 of 11 8/22; patient presents for follow-up. He has been using Hydrofera Blue to the wound bed. Previously we have been using a total contact cast. We do not have total contact casts due to manufacturing issues available for patients. He has no issues or complaints. He denies signs of infection. 8/27; patient presents for follow-up. He has been using Vashe wet-to-dry dressings. T contact cast is available for placement today. He was agreeable to otal proceed with this. He denies signs of infection. 9/3; patient presents for follow-up. We have been using Hydrofera Blue with antibiotic ointment under the total contact cast for the past week. He has tolerated this well. The wound is smaller. Unfortunately we do not have a total contact cast in office today for replacement as there is been manufacturing issues. 9/5; patient was seen earlier this week and plan was for a total contact cast but there is none available. The cast finally arrived in patient has come in today to have this placed. We will continue with Hydrofera Blue and antibiotic ointment under the cast. 9/12; patient presents for follow-up. We have been using Hydrofera Blue and antibiotic ointment under the cast. Wound is stable. He denies signs of infection. 09/17/2023: The wound is measuring smaller today. The surface appears healthy and there is minimal accumulation of slough. Electronic  Signature(s) Signed: 09/17/2023 8:06:27 AM By: [name] MD FACS Entered By: [name] on 09/17/2023 05:06:26 -------------------------------------------------------------------------------- Physical Exam Details Patient Name: Date of Service: [name] IN, [name] 09/17/2023 7:45 A M Medical Record Number: [id number] Patient Account Number: [id number] Date of Birth/Sex: Treating RN: 07-19-1971 (52 y.o. M) Primary Care Provider: [name] Other Clinician: Referring Provider: Treating Provider/Extender: [name] in Treatment: 33 Constitutional Slightly hypertensive. . . . no acute distress. Respiratory Normal work of breathing on room air. Notes 09/17/2023: The wound is measuring smaller today. The surface appears healthy and there is minimal accumulation of slough. Electronic Signature(s) Signed: 09/17/2023 8:07:01 AM By: [name] MD FACS Entered By: [name] on 09/17/2023 05:07:01 -------------------------------------------------------------------------------- Physician Orders Details Patient Name: Date of Service: [name] [place], HA  slough. The wound bed was prepared for total contact casting. Topical gentamicin and mupirocin were applied, followed by [facility] ready. A total contact cast CEDRIK, GUYTON ([id number]) [id number] Page 9 of 11 was then applied in standard fashion. Follow-up in 1 week. Electronic Signature(s) Signed: 09/17/2023 9:14:10 AM By: [name] MD FACS Signed: 09/17/2023 4:22:42 PM By: [name] Previous Signature: 09/17/2023 8:07:56 AM Version By: [name] MD FACS Entered By: [name] on 09/17/2023 05:22:53 -------------------------------------------------------------------------------- HxROS Details Patient Name: Date of Service: [name] IN, [name] 09/17/2023 7:45 A M Medical Record Number: [id number] Patient Account Number: [id number] Date of Birth/Sex: Treating RN: 05-Jul-1971 (52 y.o. M) Primary Care Provider: [name] Other Clinician: Referring Provider: Treating Provider/Extender: [name] in Treatment: 33 Information Obtained From Patient Eyes Medical History: Negative for: Cataracts; Glaucoma; Optic Neuritis Ear/Nose/Mouth/Throat Medical History: Positive for: Chronic sinus  problems/congestion Negative for: Middle ear problems Hematologic/Lymphatic Medical History: Negative for: Anemia; Hemophilia; Human Immunodeficiency Virus; Lymphedema; Sickle Cell Disease Respiratory Medical History: Negative for: Aspiration; Asthma; Chronic Obstructive Pulmonary Disease (COPD); Pneumothorax; Sleep Apnea; Tuberculosis Cardiovascular Medical History: Positive for: Congestive Heart Failure; Coronary Artery Disease; Hypertension; Myocardial Infarction - age 51 Negative for: Angina; Arrhythmia; Deep Vein Thrombosis; Hypotension; Peripheral Arterial Disease; Peripheral Venous Disease; Phlebitis; Vasculitis Past Medical History Notes: Ischemic Cardiomyopathy Gastrointestinal Medical History: Negative for: Cirrhosis ; Colitis; Crohns; Hepatitis A; Hepatitis B; Hepatitis C Endocrine Medical History: Positive for: Type II Diabetes Time with diabetes: ten years Treated with: Insulin Blood sugar tested every day: No Genitourinary Medical History: Negative for: End Stage Renal Disease Past Medical History Notes: Renal Insufficiency [name] ([id number]) [id number] Page 10 of 11 Immunological Medical History: Negative for: Lupus Erythematosus; Raynauds; Scleroderma Integumentary (Skin) Medical History: Negative for: History of Burn Musculoskeletal Medical History: Negative for: Gout; Rheumatoid Arthritis; Osteoarthritis; Osteomyelitis Neurologic Medical History: Positive for: Neuropathy Negative for: Dementia; Quadriplegia; Paraplegia; Seizure Disorder Oncologic Medical History: Negative for: Received Chemotherapy; Received Radiation Psychiatric Medical History: Negative for: Anorexia/bulimia; Confinement Anxiety HBO Extended History Items Ear/Nose/Mouth/Throat: Chronic sinus problems/congestion Immunizations Pneumococcal Vaccine: Received Pneumococcal Vaccination: No Implantable Devices None Hospitalization / Surgery  History Type of Hospitalization/Surgery Heart Cath in 2018 Family and Social History Cancer: No; Diabetes: No; Heart Disease: Yes - Mother,Father; Hereditary Spherocytosis: No; Hypertension: No; Kidney Disease: No; Lung Disease: No; Seizures: No; Stroke: No; Thyroid Problems: No; Tuberculosis: No; Never smoker; Marital Status - Single; Alcohol Use: Rarely; Drug Use: No History; Caffeine Use: Rarely; Financial Concerns: No; Food, Clothing or Shelter Needs: No; Support System Lacking: No; Transportation Concerns: No Electronic Signature(s) Signed: 09/17/2023 9:14:10 AM By: [name] MD FACS Entered By: [name] on 09/17/2023 05:06:32 -------------------------------------------------------------------------------- Total Contact Cast Details Patient Name: Date of Service: [name] IN, [name] 09/17/2023 7:45 A M Medical Record Number: [id number] Patient Account Number: [id number] Date of Birth/Sex: Treating RN: 12-Sep-1971 (52 y.o. [name] Primary Care Provider: [name] Other Clinician: Referring Provider: Treating Provider/Extender: [name] in Treatment: 34 T Contact Cast Applied for Wound Assessment: otal Wound #3 Left,Plantar Foot Performed By: Physician [name], MD The following information was scribed by: Candiss Norse, [name] ([id number]) [id number] Page 11 of 11 The information was scribed for: [name] Post Procedure Diagnosis Same as Pre-procedure Electronic Signature(s) Signed: 09/17/2023 9:14:10 AM By: [name] MD FACS Signed: 09/17/2023 4:22:42 PM By: [name] Entered By: [name] on 09/17/2023 05:00:01 -------------------------------------------------------------------------------- SuperBill Details Patient Name: Date of Service: [name]  IN, [name] 09/17/2023 Medical Record Number: [id number] Patient Account Number:  [id number] Date of Birth/Sex: Treating RN: January 26, 1971 (52 y.o. 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) Facility Procedures : CPT4 Code: [id number] Description: [phone] - APPLY TOTAL CONTACT LEG CAST ICD-10 Diagnosis Description L97.522 Non-pressure chronic ulcer of other part of left foot with fat layer exposed Modifier: Quantity: 1 Physician Procedures : CPT4 Code Description Modifier [id number] - WC PHYS LEVEL 4 - EST PT 25 ICD-10 Diagnosis Description L97.522 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.621 Type 2 diabetes mellitus with foot ulcer Z89.422  Acquired absence of other left toe(s) Quantity: 1 : [id number] - WC PHYS APPLY TOTAL CONTACT CAST ICD-10 Diagnosis Description L97.522 Non-pressure chronic ulcer of other part of left foot with fat layer exposed Quantity: 1 Electronic Signature(s) Signed: 09/17/2023 8:09:47 AM By: [name] MD FACS Entered By: [name] on 09/17/2023 05:09:47

## 2023-09-17 NOTE — Progress Notes (Signed)
[name] ([id number]) [id number] Page 1 of 7 Visit Report for 09/17/2023 Arrival Information Details Patient Name: Date of Service: [name] [name], [name] RO LD 09/17/2023 7:45 A M Medical Record Number: [id number] Patient Account Number: [id number] Date of Birth/Sex: Treating RN: 01-01-1971 (52 y.o. [name] Primary Care Theodor Mustin: [name] Other Clinician: Referring [name]: Treating Harlei Lehrmann/Extender: [name] in Treatment: 33 Visit Information History Since Last Visit Added or deleted any medications: No Patient Arrived: Ambulatory Any new allergies or adverse reactions: No Arrival Time: 07:43 Had a fall or experienced change in No Accompanied By: self activities of daily living that may affect Transfer Assistance: None risk of falls: Patient Identification Verified: Yes Signs or symptoms of abuse/neglect since last visito No Secondary Verification Process Completed: Yes Hospitalized since last visit: No Patient Requires Transmission-Based Precautions: No Implantable device outside of the clinic excluding No Patient Has Alerts: Yes cellular tissue based products placed in the center Patient Alerts: 08/2021 ABI:1.2 TBI 0.79 since last visit: Has Dressing in Place as Prescribed: Yes Has Footwear/Offloading in Place as Prescribed: Yes Left: T Contact Cast otal Pain Present Now: No Electronic Signature(s) Signed: 09/17/2023 4:22:42 PM By: [name] Entered By: [name] on 09/17/2023 04:47:38 -------------------------------------------------------------------------------- Encounter Discharge Information Details Patient Name: Date of Service: [name] [place], [name] 09/17/2023 7:45 A M Medical Record Number: [id number] Patient Account Number: [id number] Date of Birth/Sex: Treating RN: 21-Mar-1971 (52 y.o. [name] Primary Care [name]: [name] Other  Clinician: Referring [name]: Treating [name]/Extender: [name] in Treatment: 51 Encounter Discharge Information Items Discharge Condition: Stable Ambulatory Status: Ambulatory Discharge Destination: Home Transportation: Private Auto Accompanied By: self Schedule Follow-up Appointment: Yes Clinical Summary of Care: Patient Declined Electronic Signature(s) Signed: 09/17/2023 4:22:42 PM By: [name] Entered By: [name] on 09/17/2023 05:23:13 [name] ([id number]) [id number] Page 2 of 7 -------------------------------------------------------------------------------- Lower Extremity Assessment Details Patient Name: Date of Service: [name] [place], [name] RO LD 09/17/2023 7:45 A M Medical Record Number: [id number] Patient Account Number: [id number] Date of Birth/Sex: Treating RN: 1971-08-29 (52 y.o. [name] Primary Care [name]: [name] Other Clinician: Referring Aleecia Tapia: Treating [name]/Extender: [name] in Treatment: 33 Edema Assessment Assessed: [name]: No] [name]: No] Edema: [Left: N] [Right: o] Calf Left: Right: Point of Measurement: 43 cm From Medial Instep 37.5 cm Ankle Left: Right: Point of Measurement: 9 cm From Medial Instep 21.5 cm Vascular Assessment Pulses: Dorsalis Pedis Palpable: [Left:Yes] Extremity colors, hair growth, and conditions: Extremity Color: [Left:Normal] Hair Growth on Extremity: [Left:Yes] Temperature of Extremity: [Left:Warm] Capillary Refill: [Left:< 3 seconds] Dependent Rubor: [Left:No No] Electronic Signature(s) Signed: 09/17/2023 4:22:42 PM By: [name] Entered By: [name] on 09/17/2023 04:53:05 -------------------------------------------------------------------------------- Multi Wound Chart Details Patient Name: Date of Service: [name] [place], [name] 09/17/2023 7:45 A M Medical  Record Number: [id number] Patient Account Number: [id number] Date of Birth/Sex: Treating RN: [birth year] (52 y.o. M) Primary Care [name]: [name] Other Clinician: Referring [name]: Treating [name]/Extender: [name] in Treatment: 33 Vital Signs Height(in): 78 Pulse(bpm): 88 Weight(lbs): 275 Blood Pressure(mmHg): 136/98 Body Mass Index(BMI): 31.8 Temperature(F): 98.1 Respiratory Rate(breaths/min): 18 [3:Photos:] [N/A:N/A] Left, Plantar Foot N/A N/A Wound Location: Gradually Appeared N/A N/A Wounding Event: Diabetic Wound/Ulcer of the Lower N/A N/A Primary Etiology: Extremity Chronic sinus problems/congestion, N/A N/A Comorbid History: Congestive Heart Failure, Coronary Artery Disease, Hypertension, Myocardial Infarction, Type II Diabetes, Neuropathy 10/01/2022 N/A N/A Date Acquired: 6 N/A  N/A Weeks of Treatment: Open N/A N/A Wound Status: No N/A N/A Wound Recurrence: Yes N/A N/A Pending [name] on Presentation: 0.5x0.4x0.2 N/A N/A Measurements L x W x D (cm) 0.157 N/A N/A A (cm) : rea 0.031 N/A N/A Volume (cm) : 96.00% N/A N/A % Reduction in A rea: 96.10% N/A N/A % Reduction in Volume: Grade 1 N/A N/A Classification: Medium N/A N/A Exudate A mount: Serosanguineous N/A N/A Exudate Type: red, brown N/A N/A Exudate Color: Thickened N/A N/A Wound Margin: Large (67-100%) N/A N/A Granulation A mount: Red, Pink N/A N/A Granulation Quality: None Present (0%) N/A N/A Necrotic A mount: Fat Layer (Subcutaneous Tissue): Yes N/A N/A Exposed Structures: Fascia: No Tendon: No Muscle: No Joint: No Bone: No Medium (34-66%) N/A N/A Epithelialization: Callus: Yes N/A N/A Periwound Skin Texture: Excoriation: No Induration: No Crepitus: No Rash: No Scarring: No Maceration: No N/A N/A Periwound Skin Moisture: Dry/Scaly: No Atrophie Blanche: No N/A N/A Periwound Skin Color: Cyanosis: No Ecchymosis:  No Erythema: No Hemosiderin Staining: No Mottled: No Pallor: No Rubor: No No Abnormality N/A N/A Temperature: T Contact Cast otal N/A N/A Procedures Performed: Treatment Notes Electronic Signature(s) Signed: 09/17/2023 8:05:50 AM By: [name] MD FACS Entered By: [name] on 09/17/2023 05:05:50 -------------------------------------------------------------------------------- Multi-Disciplinary Care Plan Details Patient Name: Date of Service: [name] [place], [name] 09/17/2023 7:45 A M Medical Record Number: [id number] Patient Account Number: [id number] Date of Birth/Sex: Treating RN: May 12, 1971 (52 y.o. [name] Primary Care Dawanda Mapel: [name] Other Clinician: Referring Alyan Hartline: Treating Tanveer Brammer/Extender: [name] in Treatment: [address] [place] ([id number]) [id number] Page 4 of 7 Active Inactive Wound/Skin Impairment Nursing Diagnoses: Impaired tissue integrity Knowledge deficit related to ulceration/compromised skin integrity Goals: Patient will have a decrease in wound volume by X% from date: (specify in notes) Date Initiated: 01/24/2023 Target Resolution Date: 12/30/2023 Goal Status: Active Patient/caregiver will verbalize understanding of skin care regimen Date Initiated: 01/24/2023 Target Resolution Date: 12/30/2023 Goal Status: Active Ulcer/skin breakdown will have a volume reduction of 30% by week 4 Date Initiated: 01/24/2023 Date Inactivated: 03/07/2023 Target Resolution Date: 02/27/2023 Goal Status: Unmet Unmet Reason: larger today. Ulcer/skin breakdown will have a volume reduction of 50% by week 8 Date Initiated: 01/24/2023 Target Resolution Date: 12/30/2023 Goal Status: Active Interventions: Assess patient/caregiver ability to obtain necessary supplies Assess patient/caregiver ability to perform ulcer/skin care regimen upon admission and as needed Assess ulceration(s)  every visit Notes: Electronic Signature(s) Signed: 09/17/2023 4:22:42 PM By: [name] Entered By: [name] on 09/17/2023 05:01:41 -------------------------------------------------------------------------------- Pain Assessment Details Patient Name: Date of Service: [name] [place], [name] 09/17/2023 7:45 A M Medical Record Number: [id number] Patient Account Number: [id number] Date of Birth/Sex: Treating RN: 1971/12/21 (52 y.o. [name] Primary Care Chessie Neuharth: [name] Other Clinician: Referring Doretha Goding: Treating Norva Bowe/Extender: [name] in Treatment: 33 Active Problems Location of Pain Severity and Description of Pain Patient Has Paino No Site Locations Rate the pain. Current Pain Level: 0 ELVIS, ZWART ([id number]) [id number] Page 5 of 7 Pain Management and Medication Current Pain Management: Electronic Signature(s) Signed: 09/17/2023 4:22:42 PM By: [name] Entered By: [name] on 09/17/2023 04:47:53 -------------------------------------------------------------------------------- Patient/Caregiver Education Details Patient Name: Date of Service: [name] [place], [name] 9/18/2024andnbsp7:45 A M Medical Record Number: [id number] Patient Account Number: [id number] Date of Birth/Gender: Treating RN: 1971/01/31 (52 y.o. [name] Primary Care Physician: [name] Other Clinician: Referring Physician: Treating Physician/Extender: [name] in  Treatment: 33 Education Assessment Education Provided To: Patient Education Topics Provided Wound/Skin Impairment: Methods: Explain/Verbal Responses: Reinforcements needed, State content correctly Electronic Signature(s) Signed: 09/17/2023 4:22:42 PM By: [name] Entered By: [name] on 09/17/2023  05:01:51 -------------------------------------------------------------------------------- Wound Assessment Details Patient Name: Date of Service: [name] [place], [name] 09/17/2023 7:45 A M Medical Record Number: [id number] Patient Account Number: [id number] Date of Birth/Sex: Treating RN: Feb 07, 1971 (52 y.o. [name] Primary Care Jamall Strohmeier: [name] Other Clinician: Referring Miho Monda: Treating Ross Bender/Extender: [name] in Treatment: 33 Wound Status Wound Number: 3 Primary Diabetic Wound/Ulcer of the Lower Extremity Etiology: Wound Location: Left, Plantar Foot Wound Open Wounding Event: Gradually Appeared Status: Date Acquired: 10/01/2022 Comorbid Chronic sinus problems/congestion, Congestive Heart Failure, Weeks Of Treatment: 33 History: Coronary Artery Disease, Hypertension, Myocardial Infarction, Type Clustered Wound: No II Diabetes, Neuropathy Pending Amputation On Presentation Wound under treatment by Marilouise Densmore outside of [facility] [place], [place] ([id number]) [id number] Page 6 of 7 Wound Measurements Length: (cm) 0.5 Width: (cm) 0.4 Depth: (cm) 0.2 Area: (cm) 0.157 Volume: (cm) 0.031 % Reduction in Area: 96% % Reduction in Volume: 96.1% Epithelialization: Medium (34-66%) Tunneling: No Undermining: No Wound Description Classification: Grade 1 Wound Margin: Thickened Exudate Amount: Medium Exudate Type: Serosanguineous Exudate Color: red, brown Foul Odor After Cleansing: No Slough/Fibrino No Wound Bed Granulation Amount: Large (67-100%) Exposed Structure Granulation Quality: Red, Pink Fascia Exposed: No Necrotic Amount: None Present (0%) Fat Layer (Subcutaneous Tissue) Exposed: Yes Tendon Exposed: No Muscle Exposed: No Joint Exposed: No Bone Exposed: No Periwound Skin Texture Texture Color No Abnormalities Noted: No No Abnormalities Noted: No Callus: Yes Atrophie  Blanche: No Crepitus: No Cyanosis: No Excoriation: No Ecchymosis: No Induration: No Erythema: No Rash: No Hemosiderin Staining: No Scarring: No Mottled: No Pallor: No Moisture Rubor: No No Abnormalities Noted: No Dry / Scaly: No Temperature / Pain Maceration: No Temperature: No Abnormality Treatment Notes Wound #3 (Foot) Wound Laterality: Plantar, Left Cleanser Soap and Water Discharge Instruction: May shower and wash wound with dial antibacterial soap and water prior to dressing change. Vashe 5.8 (oz) Discharge Instruction: Cleanse the wound with Vashe prior to applying a clean dressing using gauze sponges, not tissue or cotton balls. Peri-Wound Care Topical Gentamicin Discharge Instruction: As directed by physician Mupirocin Ointment Discharge Instruction: Apply Mupirocin (Bactroban) as instructed Primary Dressing Hydrofera Blue Ready Transfer Foam, 2.5x2.5 (in/in) Discharge Instruction: Apply directly to wound bed as directed Optifoam Non-Adhesive Dressing, 4x4 in Discharge Instruction: Apply to ankles and midfoot JEFRI, PEZZA ([id number]) [id number] Page 7 of 7 Heel cup Discharge Instruction: cover toes prior to casting TCC #3 Secondary Dressing ABD Pad, 5x9 Secured With [employer] Sterile, 4.5x3.1 (in/yd) Discharge Instruction: Secure with Kerlix as directed. 96M Medipore H Soft Cloth Surgical T ape, 4 x 10 (in/yd) Discharge Instruction: Secure with tape as directed. Compression Wrap Compression Stockings Add-Ons Electronic Signature(s) Signed: 09/17/2023 4:22:42 PM By: [name] Entered By: [name] on 09/17/2023 04:57:39 -------------------------------------------------------------------------------- Vitals Details Patient Name: Date of Service: [name] [place], [name] 09/17/2023 7:45 A M Medical Record Number: [id number] Patient Account Number: [id number] Date of Birth/Sex: Treating RN: 27-Jul-1971 (52 y.o.  [name] Primary Care Sartaj Hoskin: [name] Other Clinician: Referring Destanie Tibbetts: Treating Jalen Oberry/Extender: [name] in Treatment: 33 Vital Signs Time Taken: 07:47 Temperature (F): 98.1 Height (in): 78 Pulse (bpm): 88 Weight (lbs): 275 Respiratory Rate (breaths/min): 18 Body Mass Index (BMI): 31.8 Blood Pressure (mmHg): 136/98 Reference Range: 80 -  120 mg / dl Electronic Signature(s) Signed: 09/17/2023 4:22:42 PM By: [name] Entered By: [name] on 09/17/2023 04:47:48

## 2023-09-23 ENCOUNTER — Encounter (HOSPITAL_BASED_OUTPATIENT_CLINIC_OR_DEPARTMENT_OTHER): Payer: 59 | Admitting: General Surgery

## 2023-09-23 DIAGNOSIS — E1122 Type 2 diabetes mellitus with diabetic chronic kidney disease: Secondary | ICD-10-CM | POA: Diagnosis not present

## 2023-09-23 DIAGNOSIS — N189 Chronic kidney disease, unspecified: Secondary | ICD-10-CM | POA: Diagnosis not present

## 2023-09-23 DIAGNOSIS — E1140 Type 2 diabetes mellitus with diabetic neuropathy, unspecified: Secondary | ICD-10-CM | POA: Diagnosis not present

## 2023-09-23 DIAGNOSIS — E11621 Type 2 diabetes mellitus with foot ulcer: Secondary | ICD-10-CM | POA: Diagnosis not present

## 2023-09-23 DIAGNOSIS — I5042 Chronic combined systolic (congestive) and diastolic (congestive) heart failure: Secondary | ICD-10-CM | POA: Diagnosis not present

## 2023-09-23 DIAGNOSIS — I130 Hypertensive heart and chronic kidney disease with heart failure and stage 1 through stage 4 chronic kidney disease, or unspecified chronic kidney disease: Secondary | ICD-10-CM | POA: Diagnosis not present

## 2023-09-23 DIAGNOSIS — E785 Hyperlipidemia, unspecified: Secondary | ICD-10-CM | POA: Diagnosis not present

## 2023-09-23 DIAGNOSIS — L97522 Non-pressure chronic ulcer of other part of left foot with fat layer exposed: Secondary | ICD-10-CM | POA: Diagnosis not present

## 2023-09-23 DIAGNOSIS — Z794 Long term (current) use of insulin: Secondary | ICD-10-CM | POA: Diagnosis not present

## 2023-09-23 DIAGNOSIS — I2510 Atherosclerotic heart disease of native coronary artery without angina pectoris: Secondary | ICD-10-CM | POA: Diagnosis not present

## 2023-09-23 NOTE — Progress Notes (Signed)
for mobility to help relieve the pressure off the wound bed. Wound is smaller today. He wants to hold off on doing the total contact cast as he is showing signs of improvement with wound healing. 7/11; patient presents for follow-up. He has been using Vashe wet-to-dry dressings and his electric scooter for offloading. Wound is slightly smaller. 7/25; patient presents for follow up. He has been using Vashe wet-to-dry dressings and he reports using his electric scooter for offloading. Wound is larger. 7/30; patient presents for follow-up. He has been using Vashe wet-to-dry dressings. He has taken Augmentin as prescribed without issues. Overall wound has healthier granulation tissue. 8/1; patient presents for follow-up. He presents for obligatory cast change. He tolerated the cast well and has no issues or complaints today. We have been using antibiotic ointment with Hydrofera Blue under the cast. Wound is  slightly smaller. 8/8; patient presents for follow-up. We have been using antibiotic ointment with Hydrofera Blue under the total contact cast. Wound is smaller. He has no issues or complaints today. 8/13; patient presents for follow-up. We have been using antibiotic ointment with Hydrofera Blue under the total contact cast. He comes in today because the top part of the cast over the foot has sunken as well as on the side. He wanted to assure that there were no issues. [name], [name] ([id number]) [id number] Page 3 of 11 8/22; patient presents for follow-up. He has been using Hydrofera Blue to the wound bed. Previously we have been using a total contact cast. We do not have total contact casts due to manufacturing issues available for patients. He has no issues or complaints. He denies signs of infection. 8/27; patient presents for follow-up. He has been using Vashe wet-to-dry dressings. T contact cast is available for placement today. He was agreeable to otal proceed with this. He denies signs of infection. 9/3; patient presents for follow-up. We have been using Hydrofera Blue with antibiotic ointment under the total contact cast for the past week. He has tolerated this well. The wound is smaller. Unfortunately we do not have a total contact cast in office today for replacement as there is been manufacturing issues. 9/5; patient was seen earlier this week and plan was for a total contact cast but there is none available. The cast finally arrived in patient has come in today to have this placed. We will continue with Hydrofera Blue and antibiotic ointment under the cast. 9/12; patient presents for follow-up. We have been using Hydrofera Blue and antibiotic ointment under the cast. Wound is stable. He denies signs of infection. 09/17/2023: The wound is measuring smaller today. The surface appears healthy and there is minimal accumulation of slough. 09/23/2023: The wound  external measurements remain about the same, but it is shallower and visually appears smaller. Electronic Signature(s) Signed: 09/23/2023 8:35:06 AM By: [name] MD FACS Entered By: [name] on 09/23/2023 08:35:06 -------------------------------------------------------------------------------- Physical Exam Details Patient Name: Date of Service: [name] [place], [name] 09/23/2023 8:30 A M Medical Record Number: [id number] Patient Account Number: [id number] Date of Birth/Sex: Treating RN: 1971-03-29 (52 y.o. M) Primary Care Provider: [name] Other Clinician: Referring Provider: Treating Provider/Extender: [name] in Treatment: 34 Constitutional . . . . no acute distress. Respiratory Normal work of breathing on room air. Notes 09/23/2023: The wound external measurements remain about the same, but it is shallower and visually appears smaller. Electronic Signature(s) Signed: 09/23/2023 8:35:47 AM By: [name] MD FACS Entered By: [name] on  [name] The information was scribed for: [name] Post Procedure Diagnosis Same as Pre-procedure Electronic Signature(s) Signed: 09/23/2023 10:42:46 AM By: [name] MD FACS Signed: 09/23/2023 2:18:50 PM By: [name] Entered By: [name] on 09/23/2023 08:28:29 -------------------------------------------------------------------------------- SuperBill Details Patient Name: Date of Service: [name] [place], [name] 09/23/2023 Medical Record Number: [id number] Patient Account Number: [id number] Date of Birth/Sex: Treating RN: 11/24/1971 (52 y.o. M) Primary Care Provider: [name] Other Clinician: Referring Provider: Treating Provider/Extender: [name] in Treatment: 34 Diagnosis Coding ICD-10 Codes Code Description [phone] 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.621 Type 2 diabetes mellitus with foot ulcer Z89.422 Acquired absence of other left toe(s) Facility Procedures : CPT4 Code: [id number] Description: [phone] - APPLY TOTAL CONTACT LEG CAST ICD-10 Diagnosis Description L97.522 Non-pressure chronic ulcer of other part of left foot with fat layer exposed Modifier: Quantity: 1 Physician Procedures : CPT4 Code Description Modifier [id number] - WC PHYS LEVEL 4 - EST PT 25 ICD-10 Diagnosis Description L97.522 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.621 Type 2 diabetes mellitus with foot ulcer Z89.422  Acquired absence of other left toe(s) Quantity: 1 : [phone] [phone] - WC PHYS APPLY TOTAL CONTACT CAST ICD-10 Diagnosis Description L97.522 Non-pressure chronic ulcer of other part of left foot with fat layer exposed Quantity: 1 Electronic Signature(s) Signed: 09/23/2023 8:37:52 AM By: [name] MD FACS Entered By: [name] on 09/23/2023 08:37:51  on 09/23/2023 08:33:59 -------------------------------------------------------------------------------- Progress Note Details Patient Name: Date of Service: [name] [place], [name] RO LD 09/23/2023 8:30 A M Medical Record Number: [id number] Patient Account Number: [id number] Date of Birth/Sex: Treating RN: May 13, 1971 (52 y.o. M) Primary Care Provider: [name] Other Clinician: Referring Provider: Treating Provider/Extender: [name] in Treatment: 49 Subjective Chief Complaint Information obtained from Patient 01/15/2023; left foot wound History of  Present Illness (HPI) ADMISSION 01/11/2020 This is a 52 year old man with uncontrolled type 2 diabetes with a recent hemoglobin A1c earlier this year of 13.4. He is on insulin and glipizide. He does not take his blood sugars at home. He does have a follow-up with primary care later this month I believe on January 27. He tells [place] that roughly a month ago he was walking with a shoe with a hole in his foot. He took the shoe off and there was an open wound at roughly the left fourth met head. This has significant undermining and raised edges. He has not noticed any purulence he does not feel unwell. More recently he was taking skin off the bottom of his foot and has a superficial area on the left fifth met head. He has not been offloading this. The patient was in the ER on 12/20. They gave him Bactroban which she has been using on the wound and 10 days worth of doxycycline. No x-rays were done. He has not had vascular studies. He is also been using hydrogen peroxide. Past medical history type 2 diabetes uncontrolled, chronic systolic heart failure, coronary artery disease with a history of congestive heart failure with stents. Hypertension hyperlipidemia and chronic renal insufficiency ABI in this clinic was 1.14 on the left. 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 He is uncertain whether he would be able to work if we put him on some form of restriction [name], [name] ([id number]) [phone].pdf Page 6 of 11 1/19; he is generally doing quite well. Using silver alginate on the wounds. Things actually look better. He has a forefoot offloading boot which she seems to be compliant about. He has support at work to stay off his foot is much as possible which is gratifying. Culture I did last week showed a few Enterococcus faecalis. I am going to put him on Augmentin. I talked about ordering an x-ray in my note last week but that does not seem to have  happened. We will review reorder the x-ray this week. 1/26; x-ray reordered last week was negative for osteomyelitis. We are using silver alginate on the wound on the third and fifth met heads on the left. He is using a Darco forefoot off loader 2/2; the area on the fifth met head is closed. Third met head is still open with tunneling depth and thick callus. 2/9; the area on the fifth met head remains closed however the third met head again has a small open area on presentation with tunneling in depth and surrounded by thick callus. This looks like a pressure issue. We have been using silver alginate 2/16; the area of the fifth metatarsal head remains closed however the area over the third metatarsal head again is a small open area but with some depth. I do not think this is changed much since last week. He is using Hydrofera Blue with forefoot off loader. He is not able to use a total contact cast on the left leg because he needs his left leg at work [employer]  for mobility to help relieve the pressure off the wound bed. Wound is smaller today. He wants to hold off on doing the total contact cast as he is showing signs of improvement with wound healing. 7/11; patient presents for follow-up. He has been using Vashe wet-to-dry dressings and his electric scooter for offloading. Wound is slightly smaller. 7/25; patient presents for follow up. He has been using Vashe wet-to-dry dressings and he reports using his electric scooter for offloading. Wound is larger. 7/30; patient presents for follow-up. He has been using Vashe wet-to-dry dressings. He has taken Augmentin as prescribed without issues. Overall wound has healthier granulation tissue. 8/1; patient presents for follow-up. He presents for obligatory cast change. He tolerated the cast well and has no issues or complaints today. We have been using antibiotic ointment with Hydrofera Blue under the cast. Wound is  slightly smaller. 8/8; patient presents for follow-up. We have been using antibiotic ointment with Hydrofera Blue under the total contact cast. Wound is smaller. He has no issues or complaints today. 8/13; patient presents for follow-up. We have been using antibiotic ointment with Hydrofera Blue under the total contact cast. He comes in today because the top part of the cast over the foot has sunken as well as on the side. He wanted to assure that there were no issues. [name], [name] ([id number]) [id number] Page 3 of 11 8/22; patient presents for follow-up. He has been using Hydrofera Blue to the wound bed. Previously we have been using a total contact cast. We do not have total contact casts due to manufacturing issues available for patients. He has no issues or complaints. He denies signs of infection. 8/27; patient presents for follow-up. He has been using Vashe wet-to-dry dressings. T contact cast is available for placement today. He was agreeable to otal proceed with this. He denies signs of infection. 9/3; patient presents for follow-up. We have been using Hydrofera Blue with antibiotic ointment under the total contact cast for the past week. He has tolerated this well. The wound is smaller. Unfortunately we do not have a total contact cast in office today for replacement as there is been manufacturing issues. 9/5; patient was seen earlier this week and plan was for a total contact cast but there is none available. The cast finally arrived in patient has come in today to have this placed. We will continue with Hydrofera Blue and antibiotic ointment under the cast. 9/12; patient presents for follow-up. We have been using Hydrofera Blue and antibiotic ointment under the cast. Wound is stable. He denies signs of infection. 09/17/2023: The wound is measuring smaller today. The surface appears healthy and there is minimal accumulation of slough. 09/23/2023: The wound  external measurements remain about the same, but it is shallower and visually appears smaller. Electronic Signature(s) Signed: 09/23/2023 8:35:06 AM By: [name] MD FACS Entered By: [name] on 09/23/2023 08:35:06 -------------------------------------------------------------------------------- Physical Exam Details Patient Name: Date of Service: [name] [place], [name] 09/23/2023 8:30 A M Medical Record Number: [id number] Patient Account Number: [id number] Date of Birth/Sex: Treating RN: 1971-03-29 (52 y.o. M) Primary Care Provider: [name] Other Clinician: Referring Provider: Treating Provider/Extender: [name] in Treatment: 34 Constitutional . . . . no acute distress. Respiratory Normal work of breathing on room air. Notes 09/23/2023: The wound external measurements remain about the same, but it is shallower and visually appears smaller. Electronic Signature(s) Signed: 09/23/2023 8:35:47 AM By: [name] MD FACS Entered By: [name] on  on 09/23/2023 08:33:59 -------------------------------------------------------------------------------- Progress Note Details Patient Name: Date of Service: [name] [place], [name] RO LD 09/23/2023 8:30 A M Medical Record Number: [id number] Patient Account Number: [id number] Date of Birth/Sex: Treating RN: May 13, 1971 (52 y.o. M) Primary Care Provider: [name] Other Clinician: Referring Provider: Treating Provider/Extender: [name] in Treatment: 49 Subjective Chief Complaint Information obtained from Patient 01/15/2023; left foot wound History of  Present Illness (HPI) ADMISSION 01/11/2020 This is a 52 year old man with uncontrolled type 2 diabetes with a recent hemoglobin A1c earlier this year of 13.4. He is on insulin and glipizide. He does not take his blood sugars at home. He does have a follow-up with primary care later this month I believe on January 27. He tells [place] that roughly a month ago he was walking with a shoe with a hole in his foot. He took the shoe off and there was an open wound at roughly the left fourth met head. This has significant undermining and raised edges. He has not noticed any purulence he does not feel unwell. More recently he was taking skin off the bottom of his foot and has a superficial area on the left fifth met head. He has not been offloading this. The patient was in the ER on 12/20. They gave him Bactroban which she has been using on the wound and 10 days worth of doxycycline. No x-rays were done. He has not had vascular studies. He is also been using hydrogen peroxide. Past medical history type 2 diabetes uncontrolled, chronic systolic heart failure, coronary artery disease with a history of congestive heart failure with stents. Hypertension hyperlipidemia and chronic renal insufficiency ABI in this clinic was 1.14 on the left. 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 He is uncertain whether he would be able to work if we put him on some form of restriction [name], [name] ([id number]) [phone].pdf Page 6 of 11 1/19; he is generally doing quite well. Using silver alginate on the wounds. Things actually look better. He has a forefoot offloading boot which she seems to be compliant about. He has support at work to stay off his foot is much as possible which is gratifying. Culture I did last week showed a few Enterococcus faecalis. I am going to put him on Augmentin. I talked about ordering an x-ray in my note last week but that does not seem to have  happened. We will review reorder the x-ray this week. 1/26; x-ray reordered last week was negative for osteomyelitis. We are using silver alginate on the wound on the third and fifth met heads on the left. He is using a Darco forefoot off loader 2/2; the area on the fifth met head is closed. Third met head is still open with tunneling depth and thick callus. 2/9; the area on the fifth met head remains closed however the third met head again has a small open area on presentation with tunneling in depth and surrounded by thick callus. This looks like a pressure issue. We have been using silver alginate 2/16; the area of the fifth metatarsal head remains closed however the area over the third metatarsal head again is a small open area but with some depth. I do not think this is changed much since last week. He is using Hydrofera Blue with forefoot off loader. He is not able to use a total contact cast on the left leg because he needs his left leg at work [employer]  1 x Per Week/30 Days [name], [name] ([id number]) [id number] Page 9 of 11 Discharge Instructions: Secure with Kerlix as directed. Secured With: 74M Medipore H Soft Cloth Surgical T ape, 4 x 10 (in/yd) 1 x Per Week/30 Days Discharge Instructions: Secure with tape as directed. 09/23/2023: The wound external measurements remain about the same, but it is shallower and visually appears smaller. The wound bed was prepared for total contact casting. We will continue the topical gentamicin and mupirocin. I changed the contact layer to Prisma silver collagen to try and encourage more ingrowth of epithelium. A total contact cast was then applied in standard fashion. Follow-up in 1 week. Electronic Signature(s) Signed: 09/23/2023 8:36:55 AM By: [name] MD FACS Entered By: [name] on 09/23/2023 08:36:55 -------------------------------------------------------------------------------- HxROS Details Patient Name: Date of Service: [name] [place], [name] 09/23/2023 8:30 A M Medical Record Number: [id number] Patient Account Number: [id number] Date of Birth/Sex: Treating RN: 1971-05-24 (52 y.o. M) Primary Care Provider: [name] Other Clinician: Referring  Provider: Treating Provider/Extender: [name] in Treatment: 34 Information Obtained From Patient Eyes Medical History: Negative for: Cataracts; Glaucoma; Optic Neuritis Ear/Nose/Mouth/Throat Medical History: Positive for: Chronic sinus problems/congestion Negative for: Middle ear problems Hematologic/Lymphatic Medical History: Negative for: Anemia; Hemophilia; Human Immunodeficiency Virus; Lymphedema; Sickle Cell Disease Respiratory Medical History: Negative for: Aspiration; Asthma; Chronic Obstructive Pulmonary Disease (COPD); Pneumothorax; Sleep Apnea; Tuberculosis Cardiovascular Medical History: Positive for: Congestive Heart Failure; Coronary Artery Disease; Hypertension; Myocardial Infarction - age 29 Negative for: Angina; Arrhythmia; Deep Vein Thrombosis; Hypotension; Peripheral Arterial Disease; Peripheral Venous Disease; Phlebitis; Vasculitis Past Medical History Notes: Ischemic Cardiomyopathy Gastrointestinal Medical History: Negative for: Cirrhosis ; Colitis; Crohns; Hepatitis A; Hepatitis B; Hepatitis C Endocrine Medical History: Positive for: Type II Diabetes Time with diabetes: ten years Treated with: Insulin [name], [name] ([id number]) [id number] Page 10 of 11 Blood sugar tested every day: No Genitourinary Medical History: Negative for: End Stage Renal Disease Past Medical History Notes: Renal Insufficiency Immunological Medical History: Negative for: Lupus Erythematosus; Raynauds; Scleroderma Integumentary (Skin) Medical History: Negative for: History of Burn Musculoskeletal Medical History: Negative for: Gout; Rheumatoid Arthritis; Osteoarthritis; Osteomyelitis Neurologic Medical History: Positive for: Neuropathy Negative for: Dementia; Quadriplegia; Paraplegia; Seizure Disorder Oncologic Medical History: Negative for: Received Chemotherapy; Received Radiation Psychiatric Medical  History: Negative for: Anorexia/bulimia; Confinement Anxiety HBO Extended History Items Ear/Nose/Mouth/Throat: Chronic sinus problems/congestion Immunizations Pneumococcal Vaccine: Received Pneumococcal Vaccination: No Implantable Devices None Hospitalization / Surgery History Type of Hospitalization/Surgery Heart Cath in 2018 Family and Social History Cancer: No; Diabetes: No; Heart Disease: Yes - Mother,Father; Hereditary Spherocytosis: No; Hypertension: No; Kidney Disease: No; Lung Disease: No; Seizures: No; Stroke: No; Thyroid Problems: No; Tuberculosis: No; Never smoker; Marital Status - Single; Alcohol Use: Rarely; Drug Use: No History; Caffeine Use: Rarely; Financial Concerns: No; Food, Clothing or Shelter Needs: No; Support System Lacking: No; Transportation Concerns: No Electronic Signature(s) Signed: 09/23/2023 10:42:46 AM By: [name] MD FACS Entered By: [name] on 09/23/2023 08:35:18 Total Contact Cast Details -------------------------------------------------------------------------------- [name] ([id number]) [id number] Page 11 of 11 Patient Name: Date of Service: [name] [place], [name] RO LD 09/23/2023 8:30 A M Medical Record Number: [id number] Patient Account Number: [id number] Date of Birth/Sex: Treating RN: [birth year] (52 y.o. [name] Primary Care Provider: [name] Other Clinician: Referring Provider: Treating Provider/Extender: [name] in Treatment: 34 T Contact Cast Applied for Wound Assessment: otal Wound #3 Left,Plantar Foot Performed By: Physician [name], MD The following information was scribed by:  on 09/23/2023 08:33:59 -------------------------------------------------------------------------------- Progress Note Details Patient Name: Date of Service: [name] [place], [name] RO LD 09/23/2023 8:30 A M Medical Record Number: [id number] Patient Account Number: [id number] Date of Birth/Sex: Treating RN: May 13, 1971 (52 y.o. M) Primary Care Provider: [name] Other Clinician: Referring Provider: Treating Provider/Extender: [name] in Treatment: 49 Subjective Chief Complaint Information obtained from Patient 01/15/2023; left foot wound History of  Present Illness (HPI) ADMISSION 01/11/2020 This is a 52 year old man with uncontrolled type 2 diabetes with a recent hemoglobin A1c earlier this year of 13.4. He is on insulin and glipizide. He does not take his blood sugars at home. He does have a follow-up with primary care later this month I believe on January 27. He tells [place] that roughly a month ago he was walking with a shoe with a hole in his foot. He took the shoe off and there was an open wound at roughly the left fourth met head. This has significant undermining and raised edges. He has not noticed any purulence he does not feel unwell. More recently he was taking skin off the bottom of his foot and has a superficial area on the left fifth met head. He has not been offloading this. The patient was in the ER on 12/20. They gave him Bactroban which she has been using on the wound and 10 days worth of doxycycline. No x-rays were done. He has not had vascular studies. He is also been using hydrogen peroxide. Past medical history type 2 diabetes uncontrolled, chronic systolic heart failure, coronary artery disease with a history of congestive heart failure with stents. Hypertension hyperlipidemia and chronic renal insufficiency ABI in this clinic was 1.14 on the left. 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 He is uncertain whether he would be able to work if we put him on some form of restriction [name], [name] ([id number]) [phone].pdf Page 6 of 11 1/19; he is generally doing quite well. Using silver alginate on the wounds. Things actually look better. He has a forefoot offloading boot which she seems to be compliant about. He has support at work to stay off his foot is much as possible which is gratifying. Culture I did last week showed a few Enterococcus faecalis. I am going to put him on Augmentin. I talked about ordering an x-ray in my note last week but that does not seem to have  happened. We will review reorder the x-ray this week. 1/26; x-ray reordered last week was negative for osteomyelitis. We are using silver alginate on the wound on the third and fifth met heads on the left. He is using a Darco forefoot off loader 2/2; the area on the fifth met head is closed. Third met head is still open with tunneling depth and thick callus. 2/9; the area on the fifth met head remains closed however the third met head again has a small open area on presentation with tunneling in depth and surrounded by thick callus. This looks like a pressure issue. We have been using silver alginate 2/16; the area of the fifth metatarsal head remains closed however the area over the third metatarsal head again is a small open area but with some depth. I do not think this is changed much since last week. He is using Hydrofera Blue with forefoot off loader. He is not able to use a total contact cast on the left leg because he needs his left leg at work [employer]  [name] The information was scribed for: [name] Post Procedure Diagnosis Same as Pre-procedure Electronic Signature(s) Signed: 09/23/2023 10:42:46 AM By: [name] MD FACS Signed: 09/23/2023 2:18:50 PM By: [name] Entered By: [name] on 09/23/2023 08:28:29 -------------------------------------------------------------------------------- SuperBill Details Patient Name: Date of Service: [name] [place], [name] 09/23/2023 Medical Record Number: [id number] Patient Account Number: [id number] Date of Birth/Sex: Treating RN: 11/24/1971 (52 y.o. M) Primary Care Provider: [name] Other Clinician: Referring Provider: Treating Provider/Extender: [name] in Treatment: 34 Diagnosis Coding ICD-10 Codes Code Description [phone] 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.621 Type 2 diabetes mellitus with foot ulcer Z89.422 Acquired absence of other left toe(s) Facility Procedures : CPT4 Code: 25366440 Description: [phone] - APPLY TOTAL CONTACT LEG CAST ICD-10 Diagnosis Description L97.522 Non-pressure chronic ulcer of other part of left foot with fat layer exposed Modifier: Quantity: 1 Physician Procedures : CPT4 Code Description Modifier [id number] - WC PHYS LEVEL 4 - EST PT 25 ICD-10 Diagnosis Description L97.522 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.621 Type 2 diabetes mellitus with foot ulcer Z89.422  Acquired absence of other left toe(s) Quantity: 1 : [phone] [id number] - WC PHYS APPLY TOTAL CONTACT CAST ICD-10 Diagnosis Description L97.522 Non-pressure chronic ulcer of other part of left foot with fat layer exposed Quantity: 1 Electronic Signature(s) Signed: 09/23/2023 8:37:52 AM By: [name] MD FACS Entered By: [name] on 09/23/2023 08:37:51  1 x Per Week/30 Days [name], [name] ([id number]) [id number] Page 9 of 11 Discharge Instructions: Secure with Kerlix as directed. Secured With: 74M Medipore H Soft Cloth Surgical T ape, 4 x 10 (in/yd) 1 x Per Week/30 Days Discharge Instructions: Secure with tape as directed. 09/23/2023: The wound external measurements remain about the same, but it is shallower and visually appears smaller. The wound bed was prepared for total contact casting. We will continue the topical gentamicin and mupirocin. I changed the contact layer to Prisma silver collagen to try and encourage more ingrowth of epithelium. A total contact cast was then applied in standard fashion. Follow-up in 1 week. Electronic Signature(s) Signed: 09/23/2023 8:36:55 AM By: [name] MD FACS Entered By: [name] on 09/23/2023 08:36:55 -------------------------------------------------------------------------------- HxROS Details Patient Name: Date of Service: [name] [place], [name] 09/23/2023 8:30 A M Medical Record Number: [id number] Patient Account Number: [id number] Date of Birth/Sex: Treating RN: 1971-05-24 (52 y.o. M) Primary Care Provider: [name] Other Clinician: Referring  Provider: Treating Provider/Extender: [name] in Treatment: 34 Information Obtained From Patient Eyes Medical History: Negative for: Cataracts; Glaucoma; Optic Neuritis Ear/Nose/Mouth/Throat Medical History: Positive for: Chronic sinus problems/congestion Negative for: Middle ear problems Hematologic/Lymphatic Medical History: Negative for: Anemia; Hemophilia; Human Immunodeficiency Virus; Lymphedema; Sickle Cell Disease Respiratory Medical History: Negative for: Aspiration; Asthma; Chronic Obstructive Pulmonary Disease (COPD); Pneumothorax; Sleep Apnea; Tuberculosis Cardiovascular Medical History: Positive for: Congestive Heart Failure; Coronary Artery Disease; Hypertension; Myocardial Infarction - age 29 Negative for: Angina; Arrhythmia; Deep Vein Thrombosis; Hypotension; Peripheral Arterial Disease; Peripheral Venous Disease; Phlebitis; Vasculitis Past Medical History Notes: Ischemic Cardiomyopathy Gastrointestinal Medical History: Negative for: Cirrhosis ; Colitis; Crohns; Hepatitis A; Hepatitis B; Hepatitis C Endocrine Medical History: Positive for: Type II Diabetes Time with diabetes: ten years Treated with: Insulin [name], [name] ([id number]) [id number] Page 10 of 11 Blood sugar tested every day: No Genitourinary Medical History: Negative for: End Stage Renal Disease Past Medical History Notes: Renal Insufficiency Immunological Medical History: Negative for: Lupus Erythematosus; Raynauds; Scleroderma Integumentary (Skin) Medical History: Negative for: History of Burn Musculoskeletal Medical History: Negative for: Gout; Rheumatoid Arthritis; Osteoarthritis; Osteomyelitis Neurologic Medical History: Positive for: Neuropathy Negative for: Dementia; Quadriplegia; Paraplegia; Seizure Disorder Oncologic Medical History: Negative for: Received Chemotherapy; Received Radiation Psychiatric Medical  History: Negative for: Anorexia/bulimia; Confinement Anxiety HBO Extended History Items Ear/Nose/Mouth/Throat: Chronic sinus problems/congestion Immunizations Pneumococcal Vaccine: Received Pneumococcal Vaccination: No Implantable Devices None Hospitalization / Surgery History Type of Hospitalization/Surgery Heart Cath in 2018 Family and Social History Cancer: No; Diabetes: No; Heart Disease: Yes - Mother,Father; Hereditary Spherocytosis: No; Hypertension: No; Kidney Disease: No; Lung Disease: No; Seizures: No; Stroke: No; Thyroid Problems: No; Tuberculosis: No; Never smoker; Marital Status - Single; Alcohol Use: Rarely; Drug Use: No History; Caffeine Use: Rarely; Financial Concerns: No; Food, Clothing or Shelter Needs: No; Support System Lacking: No; Transportation Concerns: No Electronic Signature(s) Signed: 09/23/2023 10:42:46 AM By: [name] MD FACS Entered By: [name] on 09/23/2023 08:35:18 Total Contact Cast Details -------------------------------------------------------------------------------- [name] ([id number]) [id number] Page 11 of 11 Patient Name: Date of Service: [name] [place], [name] RO LD 09/23/2023 8:30 A M Medical Record Number: [id number] Patient Account Number: [id number] Date of Birth/Sex: Treating RN: [birth year] (52 y.o. [name] Primary Care Provider: [name] Other Clinician: Referring Provider: Treating Provider/Extender: [name] in Treatment: 34 T Contact Cast Applied for Wound Assessment: otal Wound #3 Left,Plantar Foot Performed By: Physician [name], MD The following information was scribed by:  [name] The information was scribed for: [name] Post Procedure Diagnosis Same as Pre-procedure Electronic Signature(s) Signed: 09/23/2023 10:42:46 AM By: [name] MD FACS Signed: 09/23/2023 2:18:50 PM By: [name] Entered By: [name] on 09/23/2023 08:28:29 -------------------------------------------------------------------------------- SuperBill Details Patient Name: Date of Service: [name] [place], [name] 09/23/2023 Medical Record Number: [id number] Patient Account Number: [id number] Date of Birth/Sex: Treating RN: 11/24/1971 (52 y.o. M) Primary Care Provider: [name] Other Clinician: Referring Provider: Treating Provider/Extender: [name] in Treatment: 34 Diagnosis Coding ICD-10 Codes Code Description [phone] 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.621 Type 2 diabetes mellitus with foot ulcer Z89.422 Acquired absence of other left toe(s) Facility Procedures : CPT4 Code: 25366440 Description: [phone] - APPLY TOTAL CONTACT LEG CAST ICD-10 Diagnosis Description L97.522 Non-pressure chronic ulcer of other part of left foot with fat layer exposed Modifier: Quantity: 1 Physician Procedures : CPT4 Code Description Modifier [id number] - WC PHYS LEVEL 4 - EST PT 25 ICD-10 Diagnosis Description L97.522 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.621 Type 2 diabetes mellitus with foot ulcer Z89.422  Acquired absence of other left toe(s) Quantity: 1 : [phone] [id number] - WC PHYS APPLY TOTAL CONTACT CAST ICD-10 Diagnosis Description L97.522 Non-pressure chronic ulcer of other part of left foot with fat layer exposed Quantity: 1 Electronic Signature(s) Signed: 09/23/2023 8:37:52 AM By: [name] MD FACS Entered By: [name] on 09/23/2023 08:37:51  [name] The information was scribed for: [name] Post Procedure Diagnosis Same as Pre-procedure Electronic Signature(s) Signed: 09/23/2023 10:42:46 AM By: [name] MD FACS Signed: 09/23/2023 2:18:50 PM By: [name] Entered By: [name] on 09/23/2023 08:28:29 -------------------------------------------------------------------------------- SuperBill Details Patient Name: Date of Service: [name] [place], [name] 09/23/2023 Medical Record Number: [id number] Patient Account Number: [id number] Date of Birth/Sex: Treating RN: 11/24/1971 (52 y.o. M) Primary Care Provider: [name] Other Clinician: Referring Provider: Treating Provider/Extender: [name] in Treatment: 34 Diagnosis Coding ICD-10 Codes Code Description [phone] 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.621 Type 2 diabetes mellitus with foot ulcer Z89.422 Acquired absence of other left toe(s) Facility Procedures : CPT4 Code: 25366440 Description: [phone] - APPLY TOTAL CONTACT LEG CAST ICD-10 Diagnosis Description L97.522 Non-pressure chronic ulcer of other part of left foot with fat layer exposed Modifier: Quantity: 1 Physician Procedures : CPT4 Code Description Modifier [id number] - WC PHYS LEVEL 4 - EST PT 25 ICD-10 Diagnosis Description L97.522 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.621 Type 2 diabetes mellitus with foot ulcer Z89.422  Acquired absence of other left toe(s) Quantity: 1 : [phone] [id number] - WC PHYS APPLY TOTAL CONTACT CAST ICD-10 Diagnosis Description L97.522 Non-pressure chronic ulcer of other part of left foot with fat layer exposed Quantity: 1 Electronic Signature(s) Signed: 09/23/2023 8:37:52 AM By: [name] MD FACS Entered By: [name] on 09/23/2023 08:37:51  on 09/23/2023 08:33:59 -------------------------------------------------------------------------------- Progress Note Details Patient Name: Date of Service: [name] [place], [name] RO LD 09/23/2023 8:30 A M Medical Record Number: [id number] Patient Account Number: [id number] Date of Birth/Sex: Treating RN: May 13, 1971 (52 y.o. M) Primary Care Provider: [name] Other Clinician: Referring Provider: Treating Provider/Extender: [name] in Treatment: 49 Subjective Chief Complaint Information obtained from Patient 01/15/2023; left foot wound History of  Present Illness (HPI) ADMISSION 01/11/2020 This is a 52 year old man with uncontrolled type 2 diabetes with a recent hemoglobin A1c earlier this year of 13.4. He is on insulin and glipizide. He does not take his blood sugars at home. He does have a follow-up with primary care later this month I believe on January 27. He tells [place] that roughly a month ago he was walking with a shoe with a hole in his foot. He took the shoe off and there was an open wound at roughly the left fourth met head. This has significant undermining and raised edges. He has not noticed any purulence he does not feel unwell. More recently he was taking skin off the bottom of his foot and has a superficial area on the left fifth met head. He has not been offloading this. The patient was in the ER on 12/20. They gave him Bactroban which she has been using on the wound and 10 days worth of doxycycline. No x-rays were done. He has not had vascular studies. He is also been using hydrogen peroxide. Past medical history type 2 diabetes uncontrolled, chronic systolic heart failure, coronary artery disease with a history of congestive heart failure with stents. Hypertension hyperlipidemia and chronic renal insufficiency ABI in this clinic was 1.14 on the left. 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 He is uncertain whether he would be able to work if we put him on some form of restriction [name], [name] ([id number]) [phone].pdf Page 6 of 11 1/19; he is generally doing quite well. Using silver alginate on the wounds. Things actually look better. He has a forefoot offloading boot which she seems to be compliant about. He has support at work to stay off his foot is much as possible which is gratifying. Culture I did last week showed a few Enterococcus faecalis. I am going to put him on Augmentin. I talked about ordering an x-ray in my note last week but that does not seem to have  happened. We will review reorder the x-ray this week. 1/26; x-ray reordered last week was negative for osteomyelitis. We are using silver alginate on the wound on the third and fifth met heads on the left. He is using a Darco forefoot off loader 2/2; the area on the fifth met head is closed. Third met head is still open with tunneling depth and thick callus. 2/9; the area on the fifth met head remains closed however the third met head again has a small open area on presentation with tunneling in depth and surrounded by thick callus. This looks like a pressure issue. We have been using silver alginate 2/16; the area of the fifth metatarsal head remains closed however the area over the third metatarsal head again is a small open area but with some depth. I do not think this is changed much since last week. He is using Hydrofera Blue with forefoot off loader. He is not able to use a total contact cast on the left leg because he needs his left leg at work [employer]

## 2023-09-23 NOTE — Progress Notes (Signed)
N/A Weeks of Treatment: Open N/A N/A Wound Status: No N/A N/A Wound Recurrence: Yes N/A N/A Pending [name] on Presentation: 0.6x0.3x0.2 N/A N/A Measurements L x W x D (cm) 0.141 N/A N/A A (cm) : rea 0.028 N/A N/A Volume (cm) : 96.40% N/A N/A % Reduction in A rea: 96.40% N/A N/A % Reduction in Volume: Grade 1 N/A N/A Classification: Medium N/A N/A Exudate A mount: Serosanguineous N/A N/A Exudate Type: red, brown N/A N/A Exudate Color: Thickened N/A N/A Wound Margin: Large (67-100%) N/A N/A Granulation A mount: Red, Pink N/A N/A Granulation Quality: None Present (0%) N/A N/A Necrotic A mount: Fat Layer (Subcutaneous Tissue): Yes N/A N/A Exposed Structures: Fascia: No Tendon: No Muscle: No Joint: No Bone: No Medium (34-66%) N/A N/A Epithelialization: Callus: Yes N/A N/A Periwound Skin Texture: Excoriation: No Induration: No Crepitus: No Rash: No Scarring: No Maceration: No N/A N/A Periwound Skin Moisture: Dry/Scaly: No Atrophie Blanche: No N/A N/A Periwound Skin Color: Cyanosis: No Ecchymosis: No Erythema:  No Hemosiderin Staining: No Mottled: No Pallor: No Rubor: No No Abnormality N/A N/A Temperature: T Contact Cast otal N/A N/A Procedures Performed: Treatment Notes Electronic Signature(s) Signed: 09/23/2023 8:34:10 AM By: [name] MD FACS Entered By: [name] on 09/23/2023 08:34:09 -------------------------------------------------------------------------------- Multi-Disciplinary Care Plan Details Patient Name: Date of Service: [name] IN, [name] 09/23/2023 8:30 A M Medical Record Number: [id number] Patient Account Number: [id number] Date of Birth/Sex: Treating RN: 1971-09-28 (52 y.o. [name] Primary Care Quincy Boy: [name] Other Clinician: Referring [name]: Treating [name]/Extender: [name] in Treatment: [address] [place] ([id number]) [id number] Page 4 of 7 Active Inactive Wound/Skin Impairment Nursing Diagnoses: Impaired tissue integrity Knowledge deficit related to ulceration/compromised skin integrity Goals: Patient will have a decrease in wound volume by X% from date: (specify in notes) Date Initiated: 01/24/2023 Target Resolution Date: 12/30/2023 Goal Status: Active Patient/caregiver will verbalize understanding of skin care regimen Date Initiated: 01/24/2023 Target Resolution Date: 12/30/2023 Goal Status: Active Ulcer/skin breakdown will have a volume reduction of 30% by week 4 Date Initiated: 01/24/2023 Date Inactivated: 03/07/2023 Target Resolution Date: 02/27/2023 Goal Status: Unmet Unmet Reason: larger today. Ulcer/skin breakdown will have a volume reduction of 50% by week 8 Date Initiated: 01/24/2023 Target Resolution Date: 12/30/2023 Goal Status: Active Interventions: Assess patient/caregiver ability to obtain necessary supplies Assess patient/caregiver ability to perform ulcer/skin care regimen upon admission and as needed Assess ulceration(s) every  visit Notes: Electronic Signature(s) Signed: 09/23/2023 2:18:50 PM By: [name] Entered By: [name] on 09/23/2023 08:29:45 -------------------------------------------------------------------------------- Pain Assessment Details Patient Name: Date of Service: [name] IN, [name] 09/23/2023 8:30 A M Medical Record Number: [id number] Patient Account Number: [id number] Date of Birth/Sex: Treating RN: 1971/08/14 (52 y.o. [name] Primary Care [name]: [name] Other Clinician: Referring [name]: Treating [name]/Extender: [name] in Treatment: 34 Active Problems Location of Pain Severity and Description of Pain Patient Has Paino No Site Locations Rate the pain. Current Pain Level: 0 Glenn, [name] ([id number]) [id number] Page 5 of 7 Pain Management and Medication Current Pain Management: Electronic Signature(s) Signed: 09/23/2023 2:18:50 PM By: [name] Entered By: [name] on 09/23/2023 08:13:02 -------------------------------------------------------------------------------- Patient/Caregiver Education Details Patient Name: Date of Service: [name] IN, [name] 9/24/2024andnbsp8:30 A M Medical Record Number: [id number] Patient Account Number: [id number] Date of Birth/Gender: Treating RN: 15-Mar-1971 (52 y.o. [name] Primary Care Physician: [name] Other Clinician: Referring Physician: Treating Physician/Extender: [name] in  Treatment: 34 Education Assessment Education Provided To: Patient Education Topics Provided Wound/Skin Impairment: Methods: Explain/Verbal Responses: Reinforcements needed, State content correctly Electronic Signature(s) Signed: 09/23/2023 2:18:50 PM By: [name] Entered By: [name] on 09/23/2023  08:29:54 -------------------------------------------------------------------------------- Wound Assessment Details Patient Name: Date of Service: [name] IN, [name] 09/23/2023 8:30 A M Medical Record Number: [id number] Patient Account Number: [id number] Date of Birth/Sex: Treating RN: [birth year] (52 y.o. [name] Primary Care [name]: [name] Other Clinician: Referring Mardene Lessig: Treating Keanen Dohse/Extender: [name] in Treatment: 34 Wound Status Wound Number: 3 Primary Diabetic Wound/Ulcer of the Lower Extremity Etiology: Wound Location: Left, Plantar Foot Wound Open Wounding Event: Gradually Appeared Status: Date Acquired: 10/01/2022 Comorbid Chronic sinus problems/congestion, Congestive Heart Failure, Weeks Of Treatment: 34 History: Coronary Artery Disease, Hypertension, Myocardial Infarction, Type Clustered Wound: No II Diabetes, Neuropathy Pending Amputation On Presentation Wound under treatment by Janetta Vandoren outside of [facility] [place], [place] ([id number]) [id number] Page 6 of 7 Wound Measurements Length: (cm) 0.6 Width: (cm) 0.3 Depth: (cm) 0.2 Area: (cm) 0.141 Volume: (cm) 0.028 % Reduction in Area: 96.4% % Reduction in Volume: 96.4% Epithelialization: Medium (34-66%) Tunneling: No Undermining: No Wound Description Classification: Grade 1 Wound Margin: Thickened Exudate Amount: Medium Exudate Type: Serosanguineous Exudate Color: red, brown Foul Odor After Cleansing: No Slough/Fibrino No Wound Bed Granulation Amount: Large (67-100%) Exposed Structure Granulation Quality: Red, Pink Fascia Exposed: No Necrotic Amount: None Present (0%) Fat Layer (Subcutaneous Tissue) Exposed: Yes Tendon Exposed: No Muscle Exposed: No Joint Exposed: No Bone Exposed: No Periwound Skin Texture Texture Color No Abnormalities Noted: No No Abnormalities Noted: No Callus: Yes Atrophie  Blanche: No Crepitus: No Cyanosis: No Excoriation: No Ecchymosis: No Induration: No Erythema: No Rash: No Hemosiderin Staining: No Scarring: No Mottled: No Pallor: No Moisture Rubor: No No Abnormalities Noted: No Dry / Scaly: No Temperature / Pain Maceration: No Temperature: No Abnormality Treatment Notes Wound #3 (Foot) Wound Laterality: Plantar, Left Cleanser Soap and Water Discharge Instruction: May shower and wash wound with dial antibacterial soap and water prior to dressing change. Vashe 5.8 (oz) Discharge Instruction: Cleanse the wound with Vashe prior to applying a clean dressing using gauze sponges, not tissue or cotton balls. Peri-Wound Care Topical Gentamicin Discharge Instruction: As directed by physician Mupirocin Ointment Discharge Instruction: Apply Mupirocin (Bactroban) as instructed Primary Dressing Promogran Prisma Matrix, 4.34 (sq in) (silver collagen) Discharge Instruction: Moisten collagen with saline or hydrogel Optifoam Non-Adhesive Dressing, 4x4 in Discharge Instruction: Apply to ankles and midfoot DERION, HOLLIFIELD ([id number]) [id number] Page 7 of 7 Heel cup Discharge Instruction: cover toes prior to casting TCC #3 Secondary Dressing ABD Pad, 5x9 Secured With [employer] Sterile, 4.5x3.1 (in/yd) Discharge Instruction: Secure with Kerlix as directed. 5M Medipore H Soft Cloth Surgical T ape, 4 x 10 (in/yd) Discharge Instruction: Secure with tape as directed. Compression Wrap Compression Stockings Add-Ons Electronic Signature(s) Signed: 09/23/2023 2:18:50 PM By: [name] Entered By: [name] on 09/23/2023 08:25:28 -------------------------------------------------------------------------------- Vitals Details Patient Name: Date of Service: [name] IN, [name] 09/23/2023 8:30 A M Medical Record Number: [id number] Patient Account Number: [id number] Date of Birth/Sex: Treating RN: 1971-12-10 (52  y.o. [name] Primary Care Arris Meyn: [name] Other Clinician: Referring Lamya Lausch: Treating Zakiah Gauthreaux/Extender: [name] in Treatment: 34 Vital Signs Time Taken: 08:12 Temperature (F): 97.7 Height (in): 78 Pulse (bpm): 94 Weight (lbs): 275 Respiratory Rate (breaths/min): 16 Body Mass Index (BMI): 31.8 Blood Pressure (mmHg): 116/83 Reference Range: 80 -  N/A Weeks of Treatment: Open N/A N/A Wound Status: No N/A N/A Wound Recurrence: Yes N/A N/A Pending [name] on Presentation: 0.6x0.3x0.2 N/A N/A Measurements L x W x D (cm) 0.141 N/A N/A A (cm) : rea 0.028 N/A N/A Volume (cm) : 96.40% N/A N/A % Reduction in A rea: 96.40% N/A N/A % Reduction in Volume: Grade 1 N/A N/A Classification: Medium N/A N/A Exudate A mount: Serosanguineous N/A N/A Exudate Type: red, brown N/A N/A Exudate Color: Thickened N/A N/A Wound Margin: Large (67-100%) N/A N/A Granulation A mount: Red, Pink N/A N/A Granulation Quality: None Present (0%) N/A N/A Necrotic A mount: Fat Layer (Subcutaneous Tissue): Yes N/A N/A Exposed Structures: Fascia: No Tendon: No Muscle: No Joint: No Bone: No Medium (34-66%) N/A N/A Epithelialization: Callus: Yes N/A N/A Periwound Skin Texture: Excoriation: No Induration: No Crepitus: No Rash: No Scarring: No Maceration: No N/A N/A Periwound Skin Moisture: Dry/Scaly: No Atrophie Blanche: No N/A N/A Periwound Skin Color: Cyanosis: No Ecchymosis: No Erythema:  No Hemosiderin Staining: No Mottled: No Pallor: No Rubor: No No Abnormality N/A N/A Temperature: T Contact Cast otal N/A N/A Procedures Performed: Treatment Notes Electronic Signature(s) Signed: 09/23/2023 8:34:10 AM By: [name] MD FACS Entered By: [name] on 09/23/2023 08:34:09 -------------------------------------------------------------------------------- Multi-Disciplinary Care Plan Details Patient Name: Date of Service: [name] IN, [name] 09/23/2023 8:30 A M Medical Record Number: [id number] Patient Account Number: [id number] Date of Birth/Sex: Treating RN: 1971-09-28 (52 y.o. [name] Primary Care Quincy Boy: [name] Other Clinician: Referring [name]: Treating [name]/Extender: [name] in Treatment: [address] [place] ([id number]) [id number] Page 4 of 7 Active Inactive Wound/Skin Impairment Nursing Diagnoses: Impaired tissue integrity Knowledge deficit related to ulceration/compromised skin integrity Goals: Patient will have a decrease in wound volume by X% from date: (specify in notes) Date Initiated: 01/24/2023 Target Resolution Date: 12/30/2023 Goal Status: Active Patient/caregiver will verbalize understanding of skin care regimen Date Initiated: 01/24/2023 Target Resolution Date: 12/30/2023 Goal Status: Active Ulcer/skin breakdown will have a volume reduction of 30% by week 4 Date Initiated: 01/24/2023 Date Inactivated: 03/07/2023 Target Resolution Date: 02/27/2023 Goal Status: Unmet Unmet Reason: larger today. Ulcer/skin breakdown will have a volume reduction of 50% by week 8 Date Initiated: 01/24/2023 Target Resolution Date: 12/30/2023 Goal Status: Active Interventions: Assess patient/caregiver ability to obtain necessary supplies Assess patient/caregiver ability to perform ulcer/skin care regimen upon admission and as needed Assess ulceration(s) every  visit Notes: Electronic Signature(s) Signed: 09/23/2023 2:18:50 PM By: [name] Entered By: [name] on 09/23/2023 08:29:45 -------------------------------------------------------------------------------- Pain Assessment Details Patient Name: Date of Service: [name] IN, [name] 09/23/2023 8:30 A M Medical Record Number: [id number] Patient Account Number: [id number] Date of Birth/Sex: Treating RN: 1971/08/14 (52 y.o. [name] Primary Care [name]: [name] Other Clinician: Referring [name]: Treating [name]/Extender: [name] in Treatment: 34 Active Problems Location of Pain Severity and Description of Pain Patient Has Paino No Site Locations Rate the pain. Current Pain Level: 0 Glenn, [name] ([id number]) [id number] Page 5 of 7 Pain Management and Medication Current Pain Management: Electronic Signature(s) Signed: 09/23/2023 2:18:50 PM By: [name] Entered By: [name] on 09/23/2023 08:13:02 -------------------------------------------------------------------------------- Patient/Caregiver Education Details Patient Name: Date of Service: [name] IN, [name] 9/24/2024andnbsp8:30 A M Medical Record Number: [id number] Patient Account Number: [id number] Date of Birth/Gender: Treating RN: 15-Mar-1971 (52 y.o. [name] Primary Care Physician: [name] Other Clinician: Referring Physician: Treating Physician/Extender: [name] in  N/A Weeks of Treatment: Open N/A N/A Wound Status: No N/A N/A Wound Recurrence: Yes N/A N/A Pending [name] on Presentation: 0.6x0.3x0.2 N/A N/A Measurements L x W x D (cm) 0.141 N/A N/A A (cm) : rea 0.028 N/A N/A Volume (cm) : 96.40% N/A N/A % Reduction in A rea: 96.40% N/A N/A % Reduction in Volume: Grade 1 N/A N/A Classification: Medium N/A N/A Exudate A mount: Serosanguineous N/A N/A Exudate Type: red, brown N/A N/A Exudate Color: Thickened N/A N/A Wound Margin: Large (67-100%) N/A N/A Granulation A mount: Red, Pink N/A N/A Granulation Quality: None Present (0%) N/A N/A Necrotic A mount: Fat Layer (Subcutaneous Tissue): Yes N/A N/A Exposed Structures: Fascia: No Tendon: No Muscle: No Joint: No Bone: No Medium (34-66%) N/A N/A Epithelialization: Callus: Yes N/A N/A Periwound Skin Texture: Excoriation: No Induration: No Crepitus: No Rash: No Scarring: No Maceration: No N/A N/A Periwound Skin Moisture: Dry/Scaly: No Atrophie Blanche: No N/A N/A Periwound Skin Color: Cyanosis: No Ecchymosis: No Erythema:  No Hemosiderin Staining: No Mottled: No Pallor: No Rubor: No No Abnormality N/A N/A Temperature: T Contact Cast otal N/A N/A Procedures Performed: Treatment Notes Electronic Signature(s) Signed: 09/23/2023 8:34:10 AM By: [name] MD FACS Entered By: [name] on 09/23/2023 08:34:09 -------------------------------------------------------------------------------- Multi-Disciplinary Care Plan Details Patient Name: Date of Service: [name] IN, [name] 09/23/2023 8:30 A M Medical Record Number: [id number] Patient Account Number: [id number] Date of Birth/Sex: Treating RN: 1971-09-28 (52 y.o. [name] Primary Care Quincy Boy: [name] Other Clinician: Referring [name]: Treating [name]/Extender: [name] in Treatment: [address] [place] ([id number]) [id number] Page 4 of 7 Active Inactive Wound/Skin Impairment Nursing Diagnoses: Impaired tissue integrity Knowledge deficit related to ulceration/compromised skin integrity Goals: Patient will have a decrease in wound volume by X% from date: (specify in notes) Date Initiated: 01/24/2023 Target Resolution Date: 12/30/2023 Goal Status: Active Patient/caregiver will verbalize understanding of skin care regimen Date Initiated: 01/24/2023 Target Resolution Date: 12/30/2023 Goal Status: Active Ulcer/skin breakdown will have a volume reduction of 30% by week 4 Date Initiated: 01/24/2023 Date Inactivated: 03/07/2023 Target Resolution Date: 02/27/2023 Goal Status: Unmet Unmet Reason: larger today. Ulcer/skin breakdown will have a volume reduction of 50% by week 8 Date Initiated: 01/24/2023 Target Resolution Date: 12/30/2023 Goal Status: Active Interventions: Assess patient/caregiver ability to obtain necessary supplies Assess patient/caregiver ability to perform ulcer/skin care regimen upon admission and as needed Assess ulceration(s) every  visit Notes: Electronic Signature(s) Signed: 09/23/2023 2:18:50 PM By: [name] Entered By: [name] on 09/23/2023 08:29:45 -------------------------------------------------------------------------------- Pain Assessment Details Patient Name: Date of Service: [name] IN, [name] 09/23/2023 8:30 A M Medical Record Number: [id number] Patient Account Number: [id number] Date of Birth/Sex: Treating RN: 1971/08/14 (52 y.o. [name] Primary Care [name]: [name] Other Clinician: Referring [name]: Treating [name]/Extender: [name] in Treatment: 34 Active Problems Location of Pain Severity and Description of Pain Patient Has Paino No Site Locations Rate the pain. Current Pain Level: 0 Glenn, [name] ([id number]) [id number] Page 5 of 7 Pain Management and Medication Current Pain Management: Electronic Signature(s) Signed: 09/23/2023 2:18:50 PM By: [name] Entered By: [name] on 09/23/2023 08:13:02 -------------------------------------------------------------------------------- Patient/Caregiver Education Details Patient Name: Date of Service: [name] IN, [name] 9/24/2024andnbsp8:30 A M Medical Record Number: [id number] Patient Account Number: [id number] Date of Birth/Gender: Treating RN: 15-Mar-1971 (52 y.o. [name] Primary Care Physician: [name] Other Clinician: Referring Physician: Treating Physician/Extender: [name] in

## 2023-09-24 ENCOUNTER — Encounter (HOSPITAL_BASED_OUTPATIENT_CLINIC_OR_DEPARTMENT_OTHER): Payer: 59 | Admitting: General Surgery

## 2023-09-25 ENCOUNTER — Other Ambulatory Visit: Payer: Self-pay

## 2023-09-25 ENCOUNTER — Other Ambulatory Visit: Payer: Self-pay | Admitting: Critical Care Medicine

## 2023-09-25 DIAGNOSIS — I509 Heart failure, unspecified: Secondary | ICD-10-CM

## 2023-09-25 MED ORDER — CARVEDILOL 6.25 MG PO TABS
6.2500 mg | ORAL_TABLET | Freq: Two times a day (BID) | ORAL | 0 refills | Status: DC
Start: 2023-09-25 — End: 2023-10-15
  Filled 2023-09-25: qty 60, 30d supply, fill #0

## 2023-09-25 MED ORDER — ENTRESTO 97-103 MG PO TABS
1.0000 | ORAL_TABLET | Freq: Two times a day (BID) | ORAL | 0 refills | Status: DC
  Filled 2023-09-25: qty 60, 30d supply, fill #0

## 2023-09-29 ENCOUNTER — Other Ambulatory Visit: Payer: Self-pay

## 2023-09-30 ENCOUNTER — Encounter (HOSPITAL_BASED_OUTPATIENT_CLINIC_OR_DEPARTMENT_OTHER): Payer: 59 | Attending: General Surgery | Admitting: General Surgery

## 2023-09-30 ENCOUNTER — Other Ambulatory Visit: Payer: Self-pay

## 2023-09-30 DIAGNOSIS — E1122 Type 2 diabetes mellitus with diabetic chronic kidney disease: Secondary | ICD-10-CM | POA: Diagnosis not present

## 2023-09-30 DIAGNOSIS — I2510 Atherosclerotic heart disease of native coronary artery without angina pectoris: Secondary | ICD-10-CM | POA: Diagnosis not present

## 2023-09-30 DIAGNOSIS — L97522 Non-pressure chronic ulcer of other part of left foot with fat layer exposed: Secondary | ICD-10-CM | POA: Insufficient documentation

## 2023-09-30 DIAGNOSIS — E785 Hyperlipidemia, unspecified: Secondary | ICD-10-CM | POA: Diagnosis not present

## 2023-09-30 DIAGNOSIS — E11621 Type 2 diabetes mellitus with foot ulcer: Secondary | ICD-10-CM | POA: Diagnosis not present

## 2023-09-30 DIAGNOSIS — N189 Chronic kidney disease, unspecified: Secondary | ICD-10-CM | POA: Insufficient documentation

## 2023-09-30 DIAGNOSIS — I5042 Chronic combined systolic (congestive) and diastolic (congestive) heart failure: Secondary | ICD-10-CM | POA: Diagnosis not present

## 2023-09-30 DIAGNOSIS — E1140 Type 2 diabetes mellitus with diabetic neuropathy, unspecified: Secondary | ICD-10-CM | POA: Insufficient documentation

## 2023-09-30 DIAGNOSIS — Z955 Presence of coronary angioplasty implant and graft: Secondary | ICD-10-CM | POA: Diagnosis not present

## 2023-09-30 DIAGNOSIS — I252 Old myocardial infarction: Secondary | ICD-10-CM | POA: Insufficient documentation

## 2023-09-30 DIAGNOSIS — Z794 Long term (current) use of insulin: Secondary | ICD-10-CM | POA: Insufficient documentation

## 2023-09-30 DIAGNOSIS — Z7984 Long term (current) use of oral hypoglycemic drugs: Secondary | ICD-10-CM | POA: Diagnosis not present

## 2023-09-30 DIAGNOSIS — Z89422 Acquired absence of other left toe(s): Secondary | ICD-10-CM | POA: Diagnosis not present

## 2023-09-30 DIAGNOSIS — I130 Hypertensive heart and chronic kidney disease with heart failure and stage 1 through stage 4 chronic kidney disease, or unspecified chronic kidney disease: Secondary | ICD-10-CM | POA: Diagnosis not present

## 2023-09-30 NOTE — Progress Notes (Signed)
Secondary Dressing: Woven Gauze Sponge, Non-Sterile 4x4 in 1 x Per Day/30 Days Discharge Instructions: Apply over primary dressing as directed. Secured With: 2M Medipore H Soft Cloth Surgical T ape, 4 x 10 (in/yd) 1 x Per Day/30 Days Discharge Instructions: Secure with tape as directed. Patient Medications llergies: Jardiance A Notifications Medication Indication Start End 09/30/2023 lidocaine DOSE 0 -  topical 4 % cream - 0 cream topical Electronic Signature(s) Signed: 09/30/2023 9:58:14 AM By: [name] MD FACS Entered By: [name] on 09/30/2023 08:18:02 -------------------------------------------------------------------------------- Problem List Details Patient Name: Date of Service: [name] [place], [name] 09/30/2023 7:45 A M Medical Record Number: [id number] Patient Account Number: [id number] Date of Birth/Sex: Treating RN: Jan 06, 1971 (52 y.o. M) Primary Care Provider: [name] Other Clinician: Referring Provider: Treating Provider/Extender: [name] in Treatment: 35 Active Problems ICD-10 Encounter Code Description Active Date MDM Diagnosis [phone] Non-pressure chronic ulcer of other part of left foot with fat layer exposed 01/24/2023 No Yes E11.621 Type 2 diabetes mellitus with foot ulcer 01/24/2023 No Yes Z89.422 Acquired absence of other left toe(s) 01/24/2023 No Yes [name], [name] ([id number]) [id number] Page 6 of 12 Inactive Problems Resolved Problems Electronic Signature(s) Signed: 09/30/2023 8:16:16 AM By: [name] MD FACS Entered By: [name] on 09/30/2023 08:16:15 -------------------------------------------------------------------------------- Progress Note Details Patient Name: Date of Service: [name] [place], [name] 09/30/2023 7:45 A M Medical Record Number: [id number] Patient Account Number: [id number] Date of Birth/Sex: Treating RN: 1971-11-23 (52 y.o. M) Primary Care Provider: [name] Other Clinician: Referring Provider: Treating Provider/Extender: [name] in Treatment: 35 Subjective Chief Complaint Information obtained from Patient 01/15/2023; left foot wound History of Present Illness (HPI) ADMISSION 01/11/2020 This is a 52 year old man with uncontrolled type 2 diabetes with a recent hemoglobin A1c earlier this year of 13.4.  He is on insulin and glipizide. He does not take his blood sugars at home. He does have a follow-up with primary care later this month I believe on January 27. He tells [place] that roughly a month ago he was walking with a shoe with a hole in his foot. He took the shoe off and there was an open wound at roughly the left fourth met head. This has significant undermining and raised edges. He has not noticed any purulence he does not feel unwell. More recently he was taking skin off the bottom of his foot and has a superficial area on the left fifth met head. He has not been offloading this. The patient was in the ER on 12/20. They gave him Bactroban which she has been using on the wound and 10 days worth of doxycycline. No x-rays were done. He has not had vascular studies. He is also been using hydrogen peroxide. Past medical history type 2 diabetes uncontrolled, chronic systolic heart failure, coronary artery disease with a history of congestive heart failure with stents. Hypertension hyperlipidemia and chronic renal insufficiency ABI in this clinic was 1.14 on the left. 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 He is uncertain whether he would be able to work if we put him on some form of restriction 1/19; he is generally doing quite well. Using silver alginate on the wounds. Things actually look better. He has a forefoot offloading boot which she seems to be compliant about. He has support at work to stay off his foot is much as possible which is gratifying. Culture I did last week showed a  Gastrointestinal Medical History: Negative for: Cirrhosis ; Colitis; Crohns; Hepatitis A; Hepatitis B; Hepatitis C Endocrine Medical History: Positive for: Type II Diabetes Time with diabetes: ten years Treated with: Insulin Blood sugar tested every day: No Genitourinary Medical History: Negative for: End Stage Renal Disease Past Medical History Notes: Renal Insufficiency Immunological Medical History: Negative for: Lupus Erythematosus; Raynauds; Scleroderma Integumentary (Skin) Medical History: Negative for:  History of Burn Musculoskeletal Medical History: Negative for: Gout; Rheumatoid Arthritis; Osteoarthritis; Osteomyelitis Neurologic Medical History: Positive for: Neuropathy Negative for: Dementia; Quadriplegia; Paraplegia; Seizure Disorder [name], [name] ([id number]) [id number] Page 11 of 12 Oncologic Medical History: Negative for: Received Chemotherapy; Received Radiation Psychiatric Medical History: Negative for: Anorexia/bulimia; Confinement Anxiety HBO Extended History Items Ear/Nose/Mouth/Throat: Chronic sinus problems/congestion Immunizations Pneumococcal Vaccine: Received Pneumococcal Vaccination: No Implantable Devices None Hospitalization / Surgery History Type of Hospitalization/Surgery Heart Cath in 2018 Family and Social History Cancer: No; Diabetes: No; Heart Disease: Yes - Mother,Father; Hereditary Spherocytosis: No; Hypertension: No; Kidney Disease: No; Lung Disease: No; Seizures: No; Stroke: No; Thyroid Problems: No; Tuberculosis: No; Never smoker; Marital Status - Single; Alcohol Use: Rarely; Drug Use: No History; Caffeine Use: Rarely; Financial Concerns: No; Food, Clothing or Shelter Needs: No; Support System Lacking: No; Transportation Concerns: No Electronic Signature(s) Signed: 09/30/2023 9:58:14 AM By: [name] MD FACS Entered By: [name] on 09/30/2023 08:17:13 -------------------------------------------------------------------------------- SuperBill Details Patient Name: Date of Service: [name] [place], [name] 09/30/2023 Medical Record Number: [id number] Patient Account Number: [id number] Date of Birth/Sex: Treating RN: 08-15-1971 (52 y.o. 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) Facility Procedures : CPT4 Code: [id number] Description: 97597 - DEBRIDE WOUND 1ST 20 SQ CM OR < ICD-10 Diagnosis Description L97.522 Non-pressure chronic ulcer of other part of left foot with fat layer exposed Modifier: Quantity: 1 Physician Procedures : CPT4 Code Description Modifier [id number] - WC PHYS LEVEL 3 - EST PT 25 [name], [name] ([id number]) [id number] ICD-10 Diagnosis Description L97.522 Non-pressure chronic ulcer of other part of left foot with fat layer exposed  E11.621 Type 2 diabetes mellitus with foot ulcer Z89.422 Acquired absence of other left toe(s) Quantity: 1 7.pdf Page 12 of 12 : [id number] - WC PHYS DEBR WO ANESTH 20 SQ CM 1 ICD-10 Diagnosis Description L97.522 Non-pressure chronic ulcer of other part of left foot with fat layer exposed Quantity: Electronic Signature(s) Signed: 09/30/2023 8:19:26 AM By: [name] MD FACS Entered By: [name] on 09/30/2023 08:19:25  issues. 9/5; patient was seen earlier this week and plan was for a total contact cast but there is none available. The cast finally arrived in patient has come in today to have this placed. We will continue with Hydrofera Blue and antibiotic ointment under the cast. 9/12; patient presents for follow-up. We have been using Hydrofera Blue and antibiotic ointment under the cast. Wound is stable. He denies signs of infection. 09/17/2023: The wound is measuring smaller today. The surface appears healthy and there is minimal accumulation of slough. 09/23/2023: The wound external measurements remain about the same, but it is shallower and visually appears smaller. 09/30/2023: The  wound is covered with a layer of callus. Underneath the callus, there is a tiny superficial residual opening. Electronic Signature(s) Signed: 09/30/2023 8:17:06 AM By: [name] MD FACS [place], [name] [phone] By: [name] MD FACS [phone].pdf Page 4 of 12 Signed: 09/30/2023 8:17:06 Entered By: [name] on 09/30/2023 08:17:05 -------------------------------------------------------------------------------- Physical Exam Details Patient Name: Date of Service: [name] [place], [name] RO LD 09/30/2023 7:45 A M Medical Record Number: [id number] Patient Account Number: [id number] Date of Birth/Sex: Treating RN: 10-22-1971 (52 y.o. M) Primary Care Provider: [name] Other Clinician: Referring Provider: Treating Provider/Extender: [name] in Treatment: 35 Constitutional . . . . no acute distress. Respiratory Normal work of breathing on room air. Notes 09/30/2023: The wound is covered with a layer of callus. Underneath the callus, there is a tiny superficial residual opening. Electronic Signature(s) Signed: 09/30/2023 8:17:40 AM By: [name] MD FACS Entered By: [name] on 09/30/2023 08:17:39 -------------------------------------------------------------------------------- Physician Orders Details Patient Name: Date of Service: [name] [place], [name] 09/30/2023 7:45 A M Medical Record Number: [id number] Patient Account Number: [id number] Date of Birth/Sex: Treating RN: 1971-08-25 (52 y.o. [name] Primary Care Provider: [name] Other Clinician: Referring Provider: Treating Provider/Extender: [name] in Treatment: 35 The following information was scribed by: [name] The information was scribed for: [name] Verbal / Phone Orders: No Diagnosis Coding ICD-10 Coding Code Description [phone] Non-pressure chronic ulcer of other  part of left foot with fat layer exposed E11.621 Type 2 diabetes mellitus with foot ulcer Z89.422 Acquired absence of other left toe(s) Follow-up Appointments ppointment in 1 week. - Dr. Lady Gary - room 2 Return A Anesthetic (In clinic) Topical Lidocaine 4% applied to wound bed Bathing/ Shower/ Hygiene May shower and wash wound with soap and water. - with dressing changes Off-Loading Total Contact Cast to Left Lower Extremity - TCC size 3 heel cup to toes. foam pad to anterior ankle [name], [name] ([id number]) [id number] Page 5 of 12 HOLD THIS WEEK PER PATIENT REQUEST Removable cast walker boot to: - left foot HOLD THIS WEEK PER PATIENT REQUEST Wound Treatment Wound #3 - Foot Wound Laterality: Plantar, Left Cleanser: Soap and Water 1 x Per Day/30 Days Discharge Instructions: May shower and wash wound with dial antibacterial soap and water prior to dressing change. Cleanser: Vashe 5.8 (oz) 1 x Per Day/30 Days Discharge Instructions: Cleanse the wound with Vashe prior to applying a clean dressing using gauze sponges, not tissue or cotton balls. Topical: Skintegrity Hydrogel 4 (oz) 1 x Per Day/30 Days Discharge Instructions: Apply hydrogel as directed Prim Dressing: Promogran Prisma Matrix, 4.34 (sq in) (silver collagen) 1 x Per Day/30 Days ary Discharge Instructions: Moisten collagen with saline or hydrogel Secondary Dressing: Optifoam Non-Adhesive Dressing, 4x4 in 1 x Per Day/30 Days Discharge Instructions: Apply over primary dressing as directed.  EEAN, BUSS ([id number]) [id number] Page 1 of 12 Visit Report for 09/30/2023 Chief Complaint Document Details Patient Name: Date of Service: [name] [place], [name] RO LD 09/30/2023 7:45 A M Medical Record Number: [id number] Patient Account Number: [id number] Date of Birth/Sex: Treating RN: August 18, 1971 (52 y.o. M) Primary Care Provider: [name] Other Clinician: Referring Provider: Treating Provider/Extender: [name] in Treatment: 35 Information Obtained from: Patient Chief Complaint 01/15/2023; left foot wound Electronic Signature(s) Signed: 09/30/2023 8:16:30 AM By: [name] MD FACS Entered By: [name] on 09/30/2023 08:16:30 -------------------------------------------------------------------------------- Debridement Details Patient Name: Date of Service: [name] [place], [name] 09/30/2023 7:45 A M Medical Record Number: [id number] Patient Account Number: [id number] Date of Birth/Sex: Treating RN: 03-26-1971 (52 y.o. [name] Primary Care Provider: [name] Other Clinician: Referring Provider: Treating Provider/Extender: [name] in Treatment: 35 Debridement Performed for Assessment: Wound #3 Left,Plantar Foot Performed By: Physician [name], MD The following information was scribed by: [name] The information was scribed for: [name] Debridement Type: Debridement Severity of Tissue Pre Debridement: Fat layer exposed Level of Consciousness (Pre-procedure): Awake and Alert Pre-procedure Verification/Time Out Yes - 08:06 Taken: Start Time: 08:06 Pain Control: Lidocaine 4% Topical Solution Percent of Wound Bed Debrided: 100% T Area Debrided (cm): otal 0.01 Tissue and other material debrided: Non-Viable, Callus Level: Non-Viable Tissue Debridement Description: Selective/Open Wound Instrument: Curette Bleeding:  Minimum Hemostasis Achieved: Pressure Response to Treatment: Procedure was tolerated well Level of Consciousness (Post- Awake and Alert procedure): Post Debridement Measurements of Total Wound Length: (cm) 0.1 Width: (cm) 0.1 Depth: (cm) 0.1 Volume: (cm) 0.001 Character of Wound/Ulcer Post Debridement: Improved Severity of Tissue Post Debridement: Fat layer exposed [name] ([id number]) [id number] Page 2 of 12 Post Procedure Diagnosis Same as Pre-procedure Electronic Signature(s) Signed: 09/30/2023 9:58:14 AM By: [name] MD FACS Signed: 09/30/2023 3:42:47 PM By: Gelene Mink By: [name] on 09/30/2023 08:09:38 -------------------------------------------------------------------------------- HPI Details Patient Name: Date of Service: [name] [place], [name] 09/30/2023 7:45 A M Medical Record Number: [id number] Patient Account Number: [id number] Date of Birth/Sex: Treating RN: 17-May-1971 (52 y.o. M) Primary Care Provider: [name] Other Clinician: Referring Provider: Treating Provider/Extender: [name] in Treatment: 35 History of Present Illness HPI Description: ADMISSION 01/11/2020 This is a 52 year old man with uncontrolled type 2 diabetes with a recent hemoglobin A1c earlier this year of 13.4. He is on insulin and glipizide. He does not take his blood sugars at home. He does have a follow-up with primary care later this month I believe on January 27. He tells [place] that roughly a month ago he was walking with a shoe with a hole in his foot. He took the shoe off and there was an open wound at roughly the left fourth met head. This has significant undermining and raised edges. He has not noticed any purulence he does not feel unwell. More recently he was taking skin off the bottom of his foot and has a superficial area on the left fifth met head. He has not been offloading this. The  patient was in the ER on 12/20. They gave him Bactroban which she has been using on the wound and 10 days worth of doxycycline. No x-rays were done. He has not had vascular studies. He is also been using hydrogen peroxide. Past medical history type 2 diabetes uncontrolled, chronic systolic heart failure, coronary artery disease with a history of congestive heart failure with stents. Hypertension  start of the procedure. A Minimum amount of bleeding was controlled with Pressure. The procedure was tolerated well. Post Debridement Measurements: 0.1cm length x 0.1cm width x 0.1cm depth; 0.001cm^3 volume. Character of Wound/Ulcer Post Debridement is improved. Severity of Tissue Post Debridement is: Fat layer exposed. Post procedure Diagnosis Wound #3: Same as Pre-Procedure Plan Follow-up Appointments: Return Appointment in 1 week. - Dr. Lady Gary - room 2 Anesthetic: (In clinic) Topical Lidocaine 4% applied to wound bed Bathing/ Shower/ Hygiene: May shower and wash wound with soap and water. - with dressing changes Off-Loading: T Contact Cast to Left Lower Extremity - TCC size 3 heel cup to toes. foam pad to anterior ankle HOLD THIS WEEK PER PATIENT REQUEST otal Removable cast walker boot to: - left foot HOLD THIS WEEK PER  PATIENT REQUEST The following medication(s) was prescribed: lidocaine topical 4 % cream 0 0 cream topical was prescribed at facility WOUND #3: - Foot Wound Laterality: Plantar, Left Cleanser: Soap and Water 1 x Per Day/30 Days Discharge Instructions: May shower and wash wound with dial antibacterial soap and water prior to dressing change. Cleanser: Vashe 5.8 (oz) 1 x Per Day/30 Days Discharge Instructions: Cleanse the wound with Vashe prior to applying a clean dressing using gauze sponges, not tissue or cotton balls. Topical: Skintegrity Hydrogel 4 (oz) 1 x Per Day/30 Days Discharge Instructions: Apply hydrogel as directed Prim Dressing: Promogran Prisma Matrix, 4.34 (sq in) (silver collagen) 1 x Per Day/30 Days ary Discharge Instructions: Moisten collagen with saline or hydrogel Secondary Dressing: Optifoam Non-Adhesive Dressing, 4x4 in 1 x Per Day/30 Days Discharge Instructions: Apply over primary dressing as directed. Secondary Dressing: Woven Gauze Sponge, Non-Sterile 4x4 in 1 x Per Day/30 Days Discharge Instructions: Apply over primary dressing as directed. Secured With: 66M Medipore H Soft Cloth Surgical T ape, 4 x 10 (in/yd) 1 x Per Day/30 Days Discharge Instructions: Secure with tape as directed. 09/30/2023: The wound is covered with a layer of callus. Underneath the callus, there is a tiny superficial residual opening. I used a curette to debride the callus away from the wound. He is going out of town for 2 weeks, so we will not apply a total contact cast. He will continue to apply Prisma silver collagen to the site and use a foam donut to assist in offloading. I am hopeful that he will not have any deterioration while he is away and will actually be completely healed when he returns. Follow-up in 2 weeks. Electronic Signature(s) Signed: 09/30/2023 8:19:05 AM By: [name] MD FACS Entered By: [name] on 09/30/2023 08:19:04 HxROS  Details -------------------------------------------------------------------------------- [name] ([id number]) [id number] Page 10 of 12 Patient Name: Date of Service: [name] [place], [name] RO LD 09/30/2023 7:45 A M Medical Record Number: [id number] Patient Account Number: [id number] Date of Birth/Sex: Treating RN: 17-Jan-1971 (52 y.o. M) Primary Care Provider: [name] Other Clinician: Referring Provider: Treating Provider/Extender: [name] in Treatment: 35 Information Obtained From Patient Eyes Medical History: Negative for: Cataracts; Glaucoma; Optic Neuritis Ear/Nose/Mouth/Throat Medical History: Positive for: Chronic sinus problems/congestion Negative for: Middle ear problems Hematologic/Lymphatic Medical History: Negative for: Anemia; Hemophilia; Human Immunodeficiency Virus; Lymphedema; Sickle Cell Disease Respiratory Medical History: Negative for: Aspiration; Asthma; Chronic Obstructive Pulmonary Disease (COPD); Pneumothorax; Sleep Apnea; Tuberculosis Cardiovascular Medical History: Positive for: Congestive Heart Failure; Coronary Artery Disease; Hypertension; Myocardial Infarction - age 59 Negative for: Angina; Arrhythmia; Deep Vein Thrombosis; Hypotension; Peripheral Arterial Disease; Peripheral Venous Disease; Phlebitis; Vasculitis Past Medical History Notes: Ischemic Cardiomyopathy  EEAN, BUSS ([id number]) [id number] Page 1 of 12 Visit Report for 09/30/2023 Chief Complaint Document Details Patient Name: Date of Service: [name] [place], [name] RO LD 09/30/2023 7:45 A M Medical Record Number: [id number] Patient Account Number: [id number] Date of Birth/Sex: Treating RN: August 18, 1971 (52 y.o. M) Primary Care Provider: [name] Other Clinician: Referring Provider: Treating Provider/Extender: [name] in Treatment: 35 Information Obtained from: Patient Chief Complaint 01/15/2023; left foot wound Electronic Signature(s) Signed: 09/30/2023 8:16:30 AM By: [name] MD FACS Entered By: [name] on 09/30/2023 08:16:30 -------------------------------------------------------------------------------- Debridement Details Patient Name: Date of Service: [name] [place], [name] 09/30/2023 7:45 A M Medical Record Number: [id number] Patient Account Number: [id number] Date of Birth/Sex: Treating RN: 03-26-1971 (52 y.o. [name] Primary Care Provider: [name] Other Clinician: Referring Provider: Treating Provider/Extender: [name] in Treatment: 35 Debridement Performed for Assessment: Wound #3 Left,Plantar Foot Performed By: Physician [name], MD The following information was scribed by: [name] The information was scribed for: [name] Debridement Type: Debridement Severity of Tissue Pre Debridement: Fat layer exposed Level of Consciousness (Pre-procedure): Awake and Alert Pre-procedure Verification/Time Out Yes - 08:06 Taken: Start Time: 08:06 Pain Control: Lidocaine 4% Topical Solution Percent of Wound Bed Debrided: 100% T Area Debrided (cm): otal 0.01 Tissue and other material debrided: Non-Viable, Callus Level: Non-Viable Tissue Debridement Description: Selective/Open Wound Instrument: Curette Bleeding:  Minimum Hemostasis Achieved: Pressure Response to Treatment: Procedure was tolerated well Level of Consciousness (Post- Awake and Alert procedure): Post Debridement Measurements of Total Wound Length: (cm) 0.1 Width: (cm) 0.1 Depth: (cm) 0.1 Volume: (cm) 0.001 Character of Wound/Ulcer Post Debridement: Improved Severity of Tissue Post Debridement: Fat layer exposed [name] ([id number]) [id number] Page 2 of 12 Post Procedure Diagnosis Same as Pre-procedure Electronic Signature(s) Signed: 09/30/2023 9:58:14 AM By: [name] MD FACS Signed: 09/30/2023 3:42:47 PM By: Gelene Mink By: [name] on 09/30/2023 08:09:38 -------------------------------------------------------------------------------- HPI Details Patient Name: Date of Service: [name] [place], [name] 09/30/2023 7:45 A M Medical Record Number: [id number] Patient Account Number: [id number] Date of Birth/Sex: Treating RN: 17-May-1971 (52 y.o. M) Primary Care Provider: [name] Other Clinician: Referring Provider: Treating Provider/Extender: [name] in Treatment: 35 History of Present Illness HPI Description: ADMISSION 01/11/2020 This is a 52 year old man with uncontrolled type 2 diabetes with a recent hemoglobin A1c earlier this year of 13.4. He is on insulin and glipizide. He does not take his blood sugars at home. He does have a follow-up with primary care later this month I believe on January 27. He tells [place] that roughly a month ago he was walking with a shoe with a hole in his foot. He took the shoe off and there was an open wound at roughly the left fourth met head. This has significant undermining and raised edges. He has not noticed any purulence he does not feel unwell. More recently he was taking skin off the bottom of his foot and has a superficial area on the left fifth met head. He has not been offloading this. The  patient was in the ER on 12/20. They gave him Bactroban which she has been using on the wound and 10 days worth of doxycycline. No x-rays were done. He has not had vascular studies. He is also been using hydrogen peroxide. Past medical history type 2 diabetes uncontrolled, chronic systolic heart failure, coronary artery disease with a history of congestive heart failure with stents. Hypertension  Gastrointestinal Medical History: Negative for: Cirrhosis ; Colitis; Crohns; Hepatitis A; Hepatitis B; Hepatitis C Endocrine Medical History: Positive for: Type II Diabetes Time with diabetes: ten years Treated with: Insulin Blood sugar tested every day: No Genitourinary Medical History: Negative for: End Stage Renal Disease Past Medical History Notes: Renal Insufficiency Immunological Medical History: Negative for: Lupus Erythematosus; Raynauds; Scleroderma Integumentary (Skin) Medical History: Negative for:  History of Burn Musculoskeletal Medical History: Negative for: Gout; Rheumatoid Arthritis; Osteoarthritis; Osteomyelitis Neurologic Medical History: Positive for: Neuropathy Negative for: Dementia; Quadriplegia; Paraplegia; Seizure Disorder [name], [name] ([id number]) [id number] Page 11 of 12 Oncologic Medical History: Negative for: Received Chemotherapy; Received Radiation Psychiatric Medical History: Negative for: Anorexia/bulimia; Confinement Anxiety HBO Extended History Items Ear/Nose/Mouth/Throat: Chronic sinus problems/congestion Immunizations Pneumococcal Vaccine: Received Pneumococcal Vaccination: No Implantable Devices None Hospitalization / Surgery History Type of Hospitalization/Surgery Heart Cath in 2018 Family and Social History Cancer: No; Diabetes: No; Heart Disease: Yes - Mother,Father; Hereditary Spherocytosis: No; Hypertension: No; Kidney Disease: No; Lung Disease: No; Seizures: No; Stroke: No; Thyroid Problems: No; Tuberculosis: No; Never smoker; Marital Status - Single; Alcohol Use: Rarely; Drug Use: No History; Caffeine Use: Rarely; Financial Concerns: No; Food, Clothing or Shelter Needs: No; Support System Lacking: No; Transportation Concerns: No Electronic Signature(s) Signed: 09/30/2023 9:58:14 AM By: [name] MD FACS Entered By: [name] on 09/30/2023 08:17:13 -------------------------------------------------------------------------------- SuperBill Details Patient Name: Date of Service: [name] [place], [name] 09/30/2023 Medical Record Number: [id number] Patient Account Number: [id number] Date of Birth/Sex: Treating RN: 08-15-1971 (52 y.o. 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) Facility Procedures : CPT4 Code: [id number] Description: 97597 - DEBRIDE WOUND 1ST 20 SQ CM OR < ICD-10 Diagnosis Description L97.522 Non-pressure chronic ulcer of other part of left foot with fat layer exposed Modifier: Quantity: 1 Physician Procedures : CPT4 Code Description Modifier [id number] - WC PHYS LEVEL 3 - EST PT 25 [name], [name] ([id number]) [id number] ICD-10 Diagnosis Description L97.522 Non-pressure chronic ulcer of other part of left foot with fat layer exposed  E11.621 Type 2 diabetes mellitus with foot ulcer Z89.422 Acquired absence of other left toe(s) Quantity: 1 7.pdf Page 12 of 12 : [id number] 97597 - WC PHYS DEBR WO ANESTH 20 SQ CM 1 ICD-10 Diagnosis Description L97.522 Non-pressure chronic ulcer of other part of left foot with fat layer exposed Quantity: Electronic Signature(s) Signed: 09/30/2023 8:19:26 AM By: [name] MD FACS Entered By: [name] on 09/30/2023 08:19:25  Secondary Dressing: Woven Gauze Sponge, Non-Sterile 4x4 in 1 x Per Day/30 Days Discharge Instructions: Apply over primary dressing as directed. Secured With: 2M Medipore H Soft Cloth Surgical T ape, 4 x 10 (in/yd) 1 x Per Day/30 Days Discharge Instructions: Secure with tape as directed. Patient Medications llergies: Jardiance A Notifications Medication Indication Start End 09/30/2023 lidocaine DOSE 0 -  topical 4 % cream - 0 cream topical Electronic Signature(s) Signed: 09/30/2023 9:58:14 AM By: [name] MD FACS Entered By: [name] on 09/30/2023 08:18:02 -------------------------------------------------------------------------------- Problem List Details Patient Name: Date of Service: [name] [place], [name] 09/30/2023 7:45 A M Medical Record Number: [id number] Patient Account Number: [id number] Date of Birth/Sex: Treating RN: Jan 06, 1971 (52 y.o. M) Primary Care Provider: [name] Other Clinician: Referring Provider: Treating Provider/Extender: [name] in Treatment: 35 Active Problems ICD-10 Encounter Code Description Active Date MDM Diagnosis [phone] Non-pressure chronic ulcer of other part of left foot with fat layer exposed 01/24/2023 No Yes E11.621 Type 2 diabetes mellitus with foot ulcer 01/24/2023 No Yes Z89.422 Acquired absence of other left toe(s) 01/24/2023 No Yes [name], [name] ([id number]) [id number] Page 6 of 12 Inactive Problems Resolved Problems Electronic Signature(s) Signed: 09/30/2023 8:16:16 AM By: [name] MD FACS Entered By: [name] on 09/30/2023 08:16:15 -------------------------------------------------------------------------------- Progress Note Details Patient Name: Date of Service: [name] [place], [name] 09/30/2023 7:45 A M Medical Record Number: [id number] Patient Account Number: [id number] Date of Birth/Sex: Treating RN: 1971-11-23 (52 y.o. M) Primary Care Provider: [name] Other Clinician: Referring Provider: Treating Provider/Extender: [name] in Treatment: 35 Subjective Chief Complaint Information obtained from Patient 01/15/2023; left foot wound History of Present Illness (HPI) ADMISSION 01/11/2020 This is a 52 year old man with uncontrolled type 2 diabetes with a recent hemoglobin A1c earlier this year of 13.4.  He is on insulin and glipizide. He does not take his blood sugars at home. He does have a follow-up with primary care later this month I believe on January 27. He tells [place] that roughly a month ago he was walking with a shoe with a hole in his foot. He took the shoe off and there was an open wound at roughly the left fourth met head. This has significant undermining and raised edges. He has not noticed any purulence he does not feel unwell. More recently he was taking skin off the bottom of his foot and has a superficial area on the left fifth met head. He has not been offloading this. The patient was in the ER on 12/20. They gave him Bactroban which she has been using on the wound and 10 days worth of doxycycline. No x-rays were done. He has not had vascular studies. He is also been using hydrogen peroxide. Past medical history type 2 diabetes uncontrolled, chronic systolic heart failure, coronary artery disease with a history of congestive heart failure with stents. Hypertension hyperlipidemia and chronic renal insufficiency ABI in this clinic was 1.14 on the left. 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 He is uncertain whether he would be able to work if we put him on some form of restriction 1/19; he is generally doing quite well. Using silver alginate on the wounds. Things actually look better. He has a forefoot offloading boot which she seems to be compliant about. He has support at work to stay off his foot is much as possible which is gratifying. Culture I did last week showed a  start of the procedure. A Minimum amount of bleeding was controlled with Pressure. The procedure was tolerated well. Post Debridement Measurements: 0.1cm length x 0.1cm width x 0.1cm depth; 0.001cm^3 volume. Character of Wound/Ulcer Post Debridement is improved. Severity of Tissue Post Debridement is: Fat layer exposed. Post procedure Diagnosis Wound #3: Same as Pre-Procedure Plan Follow-up Appointments: Return Appointment in 1 week. - Dr. Lady Gary - room 2 Anesthetic: (In clinic) Topical Lidocaine 4% applied to wound bed Bathing/ Shower/ Hygiene: May shower and wash wound with soap and water. - with dressing changes Off-Loading: T Contact Cast to Left Lower Extremity - TCC size 3 heel cup to toes. foam pad to anterior ankle HOLD THIS WEEK PER PATIENT REQUEST otal Removable cast walker boot to: - left foot HOLD THIS WEEK PER  PATIENT REQUEST The following medication(s) was prescribed: lidocaine topical 4 % cream 0 0 cream topical was prescribed at facility WOUND #3: - Foot Wound Laterality: Plantar, Left Cleanser: Soap and Water 1 x Per Day/30 Days Discharge Instructions: May shower and wash wound with dial antibacterial soap and water prior to dressing change. Cleanser: Vashe 5.8 (oz) 1 x Per Day/30 Days Discharge Instructions: Cleanse the wound with Vashe prior to applying a clean dressing using gauze sponges, not tissue or cotton balls. Topical: Skintegrity Hydrogel 4 (oz) 1 x Per Day/30 Days Discharge Instructions: Apply hydrogel as directed Prim Dressing: Promogran Prisma Matrix, 4.34 (sq in) (silver collagen) 1 x Per Day/30 Days ary Discharge Instructions: Moisten collagen with saline or hydrogel Secondary Dressing: Optifoam Non-Adhesive Dressing, 4x4 in 1 x Per Day/30 Days Discharge Instructions: Apply over primary dressing as directed. Secondary Dressing: Woven Gauze Sponge, Non-Sterile 4x4 in 1 x Per Day/30 Days Discharge Instructions: Apply over primary dressing as directed. Secured With: 66M Medipore H Soft Cloth Surgical T ape, 4 x 10 (in/yd) 1 x Per Day/30 Days Discharge Instructions: Secure with tape as directed. 09/30/2023: The wound is covered with a layer of callus. Underneath the callus, there is a tiny superficial residual opening. I used a curette to debride the callus away from the wound. He is going out of town for 2 weeks, so we will not apply a total contact cast. He will continue to apply Prisma silver collagen to the site and use a foam donut to assist in offloading. I am hopeful that he will not have any deterioration while he is away and will actually be completely healed when he returns. Follow-up in 2 weeks. Electronic Signature(s) Signed: 09/30/2023 8:19:05 AM By: [name] MD FACS Entered By: [name] on 09/30/2023 08:19:04 HxROS  Details -------------------------------------------------------------------------------- [name] ([id number]) [id number] Page 10 of 12 Patient Name: Date of Service: [name] [place], [name] RO LD 09/30/2023 7:45 A M Medical Record Number: [id number] Patient Account Number: [id number] Date of Birth/Sex: Treating RN: 17-Jan-1971 (52 y.o. M) Primary Care Provider: [name] Other Clinician: Referring Provider: Treating Provider/Extender: [name] in Treatment: 35 Information Obtained From Patient Eyes Medical History: Negative for: Cataracts; Glaucoma; Optic Neuritis Ear/Nose/Mouth/Throat Medical History: Positive for: Chronic sinus problems/congestion Negative for: Middle ear problems Hematologic/Lymphatic Medical History: Negative for: Anemia; Hemophilia; Human Immunodeficiency Virus; Lymphedema; Sickle Cell Disease Respiratory Medical History: Negative for: Aspiration; Asthma; Chronic Obstructive Pulmonary Disease (COPD); Pneumothorax; Sleep Apnea; Tuberculosis Cardiovascular Medical History: Positive for: Congestive Heart Failure; Coronary Artery Disease; Hypertension; Myocardial Infarction - age 59 Negative for: Angina; Arrhythmia; Deep Vein Thrombosis; Hypotension; Peripheral Arterial Disease; Peripheral Venous Disease; Phlebitis; Vasculitis Past Medical History Notes: Ischemic Cardiomyopathy  Secondary Dressing: Woven Gauze Sponge, Non-Sterile 4x4 in 1 x Per Day/30 Days Discharge Instructions: Apply over primary dressing as directed. Secured With: 2M Medipore H Soft Cloth Surgical T ape, 4 x 10 (in/yd) 1 x Per Day/30 Days Discharge Instructions: Secure with tape as directed. Patient Medications llergies: Jardiance A Notifications Medication Indication Start End 09/30/2023 lidocaine DOSE 0 -  topical 4 % cream - 0 cream topical Electronic Signature(s) Signed: 09/30/2023 9:58:14 AM By: [name] MD FACS Entered By: [name] on 09/30/2023 08:18:02 -------------------------------------------------------------------------------- Problem List Details Patient Name: Date of Service: [name] [place], [name] 09/30/2023 7:45 A M Medical Record Number: [id number] Patient Account Number: [id number] Date of Birth/Sex: Treating RN: Jan 06, 1971 (52 y.o. M) Primary Care Provider: [name] Other Clinician: Referring Provider: Treating Provider/Extender: [name] in Treatment: 35 Active Problems ICD-10 Encounter Code Description Active Date MDM Diagnosis [phone] Non-pressure chronic ulcer of other part of left foot with fat layer exposed 01/24/2023 No Yes E11.621 Type 2 diabetes mellitus with foot ulcer 01/24/2023 No Yes Z89.422 Acquired absence of other left toe(s) 01/24/2023 No Yes [name], [name] ([id number]) [id number] Page 6 of 12 Inactive Problems Resolved Problems Electronic Signature(s) Signed: 09/30/2023 8:16:16 AM By: [name] MD FACS Entered By: [name] on 09/30/2023 08:16:15 -------------------------------------------------------------------------------- Progress Note Details Patient Name: Date of Service: [name] [place], [name] 09/30/2023 7:45 A M Medical Record Number: [id number] Patient Account Number: [id number] Date of Birth/Sex: Treating RN: 1971-11-23 (52 y.o. M) Primary Care Provider: [name] Other Clinician: Referring Provider: Treating Provider/Extender: [name] in Treatment: 35 Subjective Chief Complaint Information obtained from Patient 01/15/2023; left foot wound History of Present Illness (HPI) ADMISSION 01/11/2020 This is a 52 year old man with uncontrolled type 2 diabetes with a recent hemoglobin A1c earlier this year of 13.4.  He is on insulin and glipizide. He does not take his blood sugars at home. He does have a follow-up with primary care later this month I believe on January 27. He tells [place] that roughly a month ago he was walking with a shoe with a hole in his foot. He took the shoe off and there was an open wound at roughly the left fourth met head. This has significant undermining and raised edges. He has not noticed any purulence he does not feel unwell. More recently he was taking skin off the bottom of his foot and has a superficial area on the left fifth met head. He has not been offloading this. The patient was in the ER on 12/20. They gave him Bactroban which she has been using on the wound and 10 days worth of doxycycline. No x-rays were done. He has not had vascular studies. He is also been using hydrogen peroxide. Past medical history type 2 diabetes uncontrolled, chronic systolic heart failure, coronary artery disease with a history of congestive heart failure with stents. Hypertension hyperlipidemia and chronic renal insufficiency ABI in this clinic was 1.14 on the left. 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 He is uncertain whether he would be able to work if we put him on some form of restriction 1/19; he is generally doing quite well. Using silver alginate on the wounds. Things actually look better. He has a forefoot offloading boot which she seems to be compliant about. He has support at work to stay off his foot is much as possible which is gratifying. Culture I did last week showed a  EEAN, BUSS ([id number]) [id number] Page 1 of 12 Visit Report for 09/30/2023 Chief Complaint Document Details Patient Name: Date of Service: [name] [place], [name] RO LD 09/30/2023 7:45 A M Medical Record Number: [id number] Patient Account Number: [id number] Date of Birth/Sex: Treating RN: August 18, 1971 (52 y.o. M) Primary Care Provider: [name] Other Clinician: Referring Provider: Treating Provider/Extender: [name] in Treatment: 35 Information Obtained from: Patient Chief Complaint 01/15/2023; left foot wound Electronic Signature(s) Signed: 09/30/2023 8:16:30 AM By: [name] MD FACS Entered By: [name] on 09/30/2023 08:16:30 -------------------------------------------------------------------------------- Debridement Details Patient Name: Date of Service: [name] [place], [name] 09/30/2023 7:45 A M Medical Record Number: [id number] Patient Account Number: [id number] Date of Birth/Sex: Treating RN: 03-26-1971 (52 y.o. [name] Primary Care Provider: [name] Other Clinician: Referring Provider: Treating Provider/Extender: [name] in Treatment: 35 Debridement Performed for Assessment: Wound #3 Left,Plantar Foot Performed By: Physician [name], MD The following information was scribed by: [name] The information was scribed for: [name] Debridement Type: Debridement Severity of Tissue Pre Debridement: Fat layer exposed Level of Consciousness (Pre-procedure): Awake and Alert Pre-procedure Verification/Time Out Yes - 08:06 Taken: Start Time: 08:06 Pain Control: Lidocaine 4% Topical Solution Percent of Wound Bed Debrided: 100% T Area Debrided (cm): otal 0.01 Tissue and other material debrided: Non-Viable, Callus Level: Non-Viable Tissue Debridement Description: Selective/Open Wound Instrument: Curette Bleeding:  Minimum Hemostasis Achieved: Pressure Response to Treatment: Procedure was tolerated well Level of Consciousness (Post- Awake and Alert procedure): Post Debridement Measurements of Total Wound Length: (cm) 0.1 Width: (cm) 0.1 Depth: (cm) 0.1 Volume: (cm) 0.001 Character of Wound/Ulcer Post Debridement: Improved Severity of Tissue Post Debridement: Fat layer exposed [name] ([id number]) [id number] Page 2 of 12 Post Procedure Diagnosis Same as Pre-procedure Electronic Signature(s) Signed: 09/30/2023 9:58:14 AM By: [name] MD FACS Signed: 09/30/2023 3:42:47 PM By: Gelene Mink By: [name] on 09/30/2023 08:09:38 -------------------------------------------------------------------------------- HPI Details Patient Name: Date of Service: [name] [place], [name] 09/30/2023 7:45 A M Medical Record Number: [id number] Patient Account Number: [id number] Date of Birth/Sex: Treating RN: 17-May-1971 (52 y.o. M) Primary Care Provider: [name] Other Clinician: Referring Provider: Treating Provider/Extender: [name] in Treatment: 35 History of Present Illness HPI Description: ADMISSION 01/11/2020 This is a 52 year old man with uncontrolled type 2 diabetes with a recent hemoglobin A1c earlier this year of 13.4. He is on insulin and glipizide. He does not take his blood sugars at home. He does have a follow-up with primary care later this month I believe on January 27. He tells [place] that roughly a month ago he was walking with a shoe with a hole in his foot. He took the shoe off and there was an open wound at roughly the left fourth met head. This has significant undermining and raised edges. He has not noticed any purulence he does not feel unwell. More recently he was taking skin off the bottom of his foot and has a superficial area on the left fifth met head. He has not been offloading this. The  patient was in the ER on 12/20. They gave him Bactroban which she has been using on the wound and 10 days worth of doxycycline. No x-rays were done. He has not had vascular studies. He is also been using hydrogen peroxide. Past medical history type 2 diabetes uncontrolled, chronic systolic heart failure, coronary artery disease with a history of congestive heart failure with stents. Hypertension  Secondary Dressing: Woven Gauze Sponge, Non-Sterile 4x4 in 1 x Per Day/30 Days Discharge Instructions: Apply over primary dressing as directed. Secured With: 2M Medipore H Soft Cloth Surgical T ape, 4 x 10 (in/yd) 1 x Per Day/30 Days Discharge Instructions: Secure with tape as directed. Patient Medications llergies: Jardiance A Notifications Medication Indication Start End 09/30/2023 lidocaine DOSE 0 -  topical 4 % cream - 0 cream topical Electronic Signature(s) Signed: 09/30/2023 9:58:14 AM By: [name] MD FACS Entered By: [name] on 09/30/2023 08:18:02 -------------------------------------------------------------------------------- Problem List Details Patient Name: Date of Service: [name] [place], [name] 09/30/2023 7:45 A M Medical Record Number: [id number] Patient Account Number: [id number] Date of Birth/Sex: Treating RN: Jan 06, 1971 (52 y.o. M) Primary Care Provider: [name] Other Clinician: Referring Provider: Treating Provider/Extender: [name] in Treatment: 35 Active Problems ICD-10 Encounter Code Description Active Date MDM Diagnosis [phone] Non-pressure chronic ulcer of other part of left foot with fat layer exposed 01/24/2023 No Yes E11.621 Type 2 diabetes mellitus with foot ulcer 01/24/2023 No Yes Z89.422 Acquired absence of other left toe(s) 01/24/2023 No Yes [name], [name] ([id number]) [id number] Page 6 of 12 Inactive Problems Resolved Problems Electronic Signature(s) Signed: 09/30/2023 8:16:16 AM By: [name] MD FACS Entered By: [name] on 09/30/2023 08:16:15 -------------------------------------------------------------------------------- Progress Note Details Patient Name: Date of Service: [name] [place], [name] 09/30/2023 7:45 A M Medical Record Number: [id number] Patient Account Number: [id number] Date of Birth/Sex: Treating RN: 1971-11-23 (52 y.o. M) Primary Care Provider: [name] Other Clinician: Referring Provider: Treating Provider/Extender: [name] in Treatment: 35 Subjective Chief Complaint Information obtained from Patient 01/15/2023; left foot wound History of Present Illness (HPI) ADMISSION 01/11/2020 This is a 52 year old man with uncontrolled type 2 diabetes with a recent hemoglobin A1c earlier this year of 13.4.  He is on insulin and glipizide. He does not take his blood sugars at home. He does have a follow-up with primary care later this month I believe on January 27. He tells [place] that roughly a month ago he was walking with a shoe with a hole in his foot. He took the shoe off and there was an open wound at roughly the left fourth met head. This has significant undermining and raised edges. He has not noticed any purulence he does not feel unwell. More recently he was taking skin off the bottom of his foot and has a superficial area on the left fifth met head. He has not been offloading this. The patient was in the ER on 12/20. They gave him Bactroban which she has been using on the wound and 10 days worth of doxycycline. No x-rays were done. He has not had vascular studies. He is also been using hydrogen peroxide. Past medical history type 2 diabetes uncontrolled, chronic systolic heart failure, coronary artery disease with a history of congestive heart failure with stents. Hypertension hyperlipidemia and chronic renal insufficiency ABI in this clinic was 1.14 on the left. 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 He is uncertain whether he would be able to work if we put him on some form of restriction 1/19; he is generally doing quite well. Using silver alginate on the wounds. Things actually look better. He has a forefoot offloading boot which she seems to be compliant about. He has support at work to stay off his foot is much as possible which is gratifying. Culture I did last week showed a

## 2023-09-30 NOTE — Progress Notes (Signed)
[name], [name] ([id number]) [id number] Page 1 of 7 Visit Report for 09/30/2023 Arrival Information Details Patient Name: Date of Service: [name] [place], [name] RO LD 09/30/2023 7:45 A M Medical Record Number: [id number] Patient Account Number: [id number] Date of Birth/Sex: Treating RN: 1971-07-06 (52 y.o. [name] Primary Care Akhil Piscopo: [name] Other Clinician: Referring Alisha Burgo: Treating Zerenity Bowron/Extender: [name] in Treatment: 35 Visit Information History Since Last Visit Added or deleted any medications: No Patient Arrived: Ambulatory Any new allergies or adverse reactions: No Arrival Time: 07:50 Had a fall or experienced change in No Accompanied By: self activities of daily living that may affect Transfer Assistance: None risk of falls: Patient Identification Verified: Yes Signs or symptoms of abuse/neglect since last visito No Secondary Verification Process Completed: Yes Hospitalized since last visit: No Patient Requires Transmission-Based Precautions: No Implantable device outside of the clinic excluding No Patient Has Alerts: Yes cellular tissue based products placed in the center Patient Alerts: 08/2021 ABI:1.2 TBI 0.79 since last visit: Has Dressing in Place as Prescribed: Yes Pain Present Now: No Electronic Signature(s) Signed: 09/30/2023 3:42:47 PM By: [name] Entered By: [name] on 09/30/2023 07:50:47 -------------------------------------------------------------------------------- Encounter Discharge Information Details Patient Name: Date of Service: [name] [place], [name] 09/30/2023 7:45 A M Medical Record Number: [id number] Patient Account Number: [id number] Date of Birth/Sex: Treating RN: April 13, 1971 (52 y.o. [name] Primary Care Kaho Selle: [name] Other Clinician: Referring Charity Tessier: Treating Sissi Padia/Extender: [name] in Treatment: 35 Encounter Discharge Information Items Post Procedure Vitals Discharge Condition: Stable Temperature (F): 97.5 Ambulatory Status: Ambulatory Pulse (bpm): 89 Discharge Destination: Home Respiratory Rate (breaths/min): 16 Transportation: Private Auto Blood Pressure (mmHg): 119/81 Accompanied By: self Schedule Follow-up Appointment: Yes Clinical Summary of Care: Patient Declined Electronic Signature(s) Signed: 09/30/2023 3:42:47 PM By: [name] Entered By: [name] on 09/30/2023 08:27:32 [name] ([id number]) [id number] Page 2 of 7 -------------------------------------------------------------------------------- Lower Extremity Assessment Details Patient Name: Date of Service: [name] [place], [name] RO LD 09/30/2023 7:45 A M Medical Record Number: [id number] Patient Account Number: [id number] Date of Birth/Sex: Treating RN: Mar 01, 1971 (52 y.o. [name] Primary Care Pailynn Vahey: [name] Other Clinician: Referring Navika Hoopes: Treating Emmy Keng/Extender: [name] in Treatment: 35 Edema Assessment Assessed: [name]: No] [name]: No] Edema: [Left: N] [Right: o] Calf Left: Right: Point of Measurement: 43 cm From Medial Instep 36.5 cm Ankle Left: Right: Point of Measurement: 9 cm From Medial Instep 20.8 cm Vascular Assessment Pulses: Dorsalis Pedis Palpable: [Left:Yes] Extremity colors, hair growth, and conditions: Extremity Color: [Left:Normal] Hair Growth on Extremity: [Left:Yes] Temperature of Extremity: [Left:Warm] Capillary Refill: [Left:< 3 seconds] Dependent Rubor: [Left:No No] Electronic Signature(s) Signed: 09/30/2023 3:42:47 PM By: [name] Entered By: [name] on 09/30/2023 08:00:09 -------------------------------------------------------------------------------- Multi Wound Chart Details Patient Name: Date of Service: [name],  [name] 09/30/2023 7:45 A M Medical Record Number: [id number] Patient Account Number: [id number] Date of Birth/Sex: Treating RN: [birth year] (52 y.o. M) Primary Care [name]: [name] Other Clinician: Referring Heron Pitcock: Treating [name]/Extender: [name] in Treatment: 35 Vital Signs Height(in): 78 Pulse(bpm): 89 Weight(lbs): 275 Blood Pressure(mmHg): 119/81 Body Mass Index(BMI): 31.8 Temperature(F): 97.5 Respiratory Rate(breaths/min): 16 [3:Photos:] [N/A:N/A] Left, Plantar Foot N/A N/A Wound Location: Gradually Appeared N/A N/A Wounding Event: Diabetic Wound/Ulcer of the Lower N/A N/A Primary Etiology: Extremity Chronic sinus problems/congestion, N/A N/A Comorbid History: Congestive Heart Failure, Coronary Artery Disease, Hypertension, Myocardial Infarction, Type II Diabetes, Neuropathy 10/01/2022 N/A  Pain Management: Electronic Signature(s) Signed: 09/30/2023 3:42:47 PM By: [name] Entered By: [name] on 09/30/2023 07:51:06 -------------------------------------------------------------------------------- Patient/Caregiver Education Details Patient Name: Date of Service: [name], [name] 10/1/2024andnbsp7:45 A M Medical Record Number: [id number] Patient Account Number: [id number] Date of Birth/Gender: Treating RN: [birth year] (52 y.o. [name] Primary Care Physician: [name] Other Clinician: Referring Physician: Treating Physician/Extender: [name] in Treatment: 35 Education Assessment Education Provided To: Patient Education Topics Provided Wound/Skin Impairment: Methods: Explain/Verbal Responses: Reinforcements needed, State content correctly Electronic Signature(s) Signed: 09/30/2023 3:42:47 PM By: [name] Entered By: [name] on 09/30/2023 08:04:21 -------------------------------------------------------------------------------- Wound Assessment Details Patient Name: Date of Service: [name], [name] 09/30/2023 7:45 A M Medical Record Number: [id number] Patient Account Number: [id number] Date of Birth/Sex: Treating RN: 07/17/1971 (52 y.o. [name] Primary Care [name]: [name] Other Clinician: Referring Aimy Sweeting: Treating Sheriden Archibeque/Extender: Nettie Elm [place], [name] ([id number]) [id number] Page 6 of 7 Weeks in Treatment: 35 Wound Status Wound Number: 3 Primary Diabetic Wound/Ulcer of the Lower Extremity Etiology: Wound Location: Left, Plantar Foot Wound Open Wounding Event: Gradually Appeared Status: Date Acquired: 10/01/2022 Comorbid Chronic sinus problems/congestion, Congestive Heart Failure, Weeks Of Treatment: 35 History: Coronary Artery Disease, Hypertension, Myocardial Infarction, Type Clustered Wound: No II Diabetes, Neuropathy Pending Amputation On Presentation Wound under treatment by Kevaughn Ewing outside of Wound Center Photos Wound Measurements Length: (cm) 0.1 Width: (cm) 0.1 Depth: (cm) 0.1 Area: (cm) 0.008 Volume: (cm) 0.001 % Reduction in Area: 99.8% % Reduction in Volume: 99.9% Epithelialization: Large (67-100%) Tunneling: No Undermining: No Wound Description Classification: Grade 1 Wound Margin: Thickened Exudate Amount: None Present Foul Odor After Cleansing: No Slough/Fibrino  No Wound Bed Granulation Amount: None Present (0%) Exposed Structure Necrotic Amount: None Present (0%) Fascia Exposed: No Fat Layer (Subcutaneous Tissue) Exposed: No Tendon Exposed: No Muscle Exposed: No Joint Exposed: No Bone Exposed: No Periwound Skin Texture Texture Color No Abnormalities Noted: No No Abnormalities Noted: Yes Callus: Yes Temperature / Pain Crepitus: No Temperature: No Abnormality Excoriation: No Induration: No Rash: No Scarring: No Moisture No Abnormalities Noted: Yes Treatment Notes Wound #3 (Foot) Wound Laterality: Plantar, Left Cleanser Soap and Water Discharge Instruction: May shower and wash wound with dial antibacterial soap and water prior to dressing change. Vashe 5.8 (oz) Discharge Instruction: Cleanse the wound with Vashe prior to applying a clean dressing using gauze sponges, not tissue or cotton balls. Peri-Wound Care Topical Skintegrity Hydrogel 4 (oz) CHRISTOFER, SHEN ([id number]) [id number] Page 7 of 7 Discharge Instruction: Apply hydrogel as directed Primary Dressing Promogran Prisma Matrix, 4.34 (sq in) (silver collagen) Discharge Instruction: Moisten collagen with saline or hydrogel Secondary Dressing Optifoam Non-Adhesive Dressing, 4x4 in Discharge Instruction: Apply over primary dressing as directed. Woven Gauze Sponge, Non-Sterile 4x4 in Discharge Instruction: Apply over primary dressing as directed. Secured With 29M Medipore H Soft Cloth Surgical T ape, 4 x 10 (in/yd) Discharge Instruction: Secure with tape as directed. Compression Wrap Compression Stockings Add-Ons Electronic Signature(s) Signed: 09/30/2023 3:42:47 PM By: [name] Entered By: [name] on 09/30/2023 08:02:35 -------------------------------------------------------------------------------- Vitals Details Patient Name: Date of Service: [name], [name] 09/30/2023 7:45 A M Medical Record Number:  [id number] Patient Account Number: [id number] Date of Birth/Sex: Treating RN: [birth year] (52 y.o. [name] Primary Care Dishawn Bhargava: [name] Other Clinician: Referring Satvik Parco: Treating Tache Bobst/Extender: [name] in Treatment: 35 Vital Signs Time Taken: 07:50 Temperature (F): 97.5 Height (in): 78 Pulse (  [name], [name] ([id number]) [id number] Page 1 of 7 Visit Report for 09/30/2023 Arrival Information Details Patient Name: Date of Service: [name] [place], [name] RO LD 09/30/2023 7:45 A M Medical Record Number: [id number] Patient Account Number: [id number] Date of Birth/Sex: Treating RN: 1971-07-06 (52 y.o. [name] Primary Care Akhil Piscopo: [name] Other Clinician: Referring Alisha Burgo: Treating Zerenity Bowron/Extender: [name] in Treatment: 35 Visit Information History Since Last Visit Added or deleted any medications: No Patient Arrived: Ambulatory Any new allergies or adverse reactions: No Arrival Time: 07:50 Had a fall or experienced change in No Accompanied By: self activities of daily living that may affect Transfer Assistance: None risk of falls: Patient Identification Verified: Yes Signs or symptoms of abuse/neglect since last visito No Secondary Verification Process Completed: Yes Hospitalized since last visit: No Patient Requires Transmission-Based Precautions: No Implantable device outside of the clinic excluding No Patient Has Alerts: Yes cellular tissue based products placed in the center Patient Alerts: 08/2021 ABI:1.2 TBI 0.79 since last visit: Has Dressing in Place as Prescribed: Yes Pain Present Now: No Electronic Signature(s) Signed: 09/30/2023 3:42:47 PM By: [name] Entered By: [name] on 09/30/2023 07:50:47 -------------------------------------------------------------------------------- Encounter Discharge Information Details Patient Name: Date of Service: [name], [name] 09/30/2023 7:45 A M Medical Record Number: [id number] Patient Account Number: [id number] Date of Birth/Sex: Treating RN: April 13, 1971 (52 y.o. [name] Primary Care Kaho Selle: [name] Other Clinician: Referring Charity Tessier: Treating Sissi Padia/Extender: [name] in Treatment: 35 Encounter Discharge Information Items Post Procedure Vitals Discharge Condition: Stable Temperature (F): 97.5 Ambulatory Status: Ambulatory Pulse (bpm): 89 Discharge Destination: Home Respiratory Rate (breaths/min): 16 Transportation: Private Auto Blood Pressure (mmHg): 119/81 Accompanied By: self Schedule Follow-up Appointment: Yes Clinical Summary of Care: Patient Declined Electronic Signature(s) Signed: 09/30/2023 3:42:47 PM By: [name] Entered By: [name] on 09/30/2023 08:27:32 [name] ([id number]) [id number] Page 2 of 7 -------------------------------------------------------------------------------- Lower Extremity Assessment Details Patient Name: Date of Service: [name] [place], [name] RO LD 09/30/2023 7:45 A M Medical Record Number: [id number] Patient Account Number: [id number] Date of Birth/Sex: Treating RN: Mar 01, 1971 (52 y.o. [name] Primary Care Pailynn Vahey: [name] Other Clinician: Referring Navika Hoopes: Treating Emmy Keng/Extender: [name] in Treatment: 35 Edema Assessment Assessed: [name]: No] [name]: No] Edema: [Left: N] [Right: o] Calf Left: Right: Point of Measurement: 43 cm From Medial Instep 36.5 cm Ankle Left: Right: Point of Measurement: 9 cm From Medial Instep 20.8 cm Vascular Assessment Pulses: Dorsalis Pedis Palpable: [Left:Yes] Extremity colors, hair growth, and conditions: Extremity Color: [Left:Normal] Hair Growth on Extremity: [Left:Yes] Temperature of Extremity: [Left:Warm] Capillary Refill: [Left:< 3 seconds] Dependent Rubor: [Left:No No] Electronic Signature(s) Signed: 09/30/2023 3:42:47 PM By: [name] Entered By: [name] on 09/30/2023 08:00:09 -------------------------------------------------------------------------------- Multi Wound Chart Details Patient Name: Date of Service: [name],  [name] 09/30/2023 7:45 A M Medical Record Number: [id number] Patient Account Number: [id number] Date of Birth/Sex: Treating RN: [birth year] (52 y.o. M) Primary Care [name]: [name] Other Clinician: Referring Heron Pitcock: Treating [name]/Extender: [name] in Treatment: 35 Vital Signs Height(in): 78 Pulse(bpm): 89 Weight(lbs): 275 Blood Pressure(mmHg): 119/81 Body Mass Index(BMI): 31.8 Temperature(F): 97.5 Respiratory Rate(breaths/min): 16 [3:Photos:] [N/A:N/A] Left, Plantar Foot N/A N/A Wound Location: Gradually Appeared N/A N/A Wounding Event: Diabetic Wound/Ulcer of the Lower N/A N/A Primary Etiology: Extremity Chronic sinus problems/congestion, N/A N/A Comorbid History: Congestive Heart Failure, Coronary Artery Disease, Hypertension, Myocardial Infarction, Type II Diabetes, Neuropathy 10/01/2022 N/A  N/A Date Acquired: 81 N/A N/A Weeks of Treatment: Open N/A N/A Wound Status: No N/A N/A Wound Recurrence: Yes N/A N/A Pending [name] on Presentation: 0.1x0.1x0.1 N/A N/A Measurements L x W x D (cm) 0.008 N/A N/A A (cm) : rea 0.001 N/A N/A Volume (cm) : 99.80% N/A N/A % Reduction in A rea: 99.90% N/A N/A % Reduction in Volume: Grade 1 N/A N/A Classification: None Present N/A N/A Exudate A mount: Thickened N/A N/A Wound Margin: None Present (0%) N/A N/A Granulation A mount: None Present (0%) N/A N/A Necrotic A mount: Fascia: No N/A N/A Exposed Structures: Fat Layer (Subcutaneous Tissue): No Tendon: No Muscle: No Joint: No Bone: No Large (67-100%) N/A N/A Epithelialization: Debridement - Selective/Open Wound N/A N/A Debridement: Pre-procedure Verification/Time Out 08:06 N/A N/A Taken: Lidocaine 4% Topical Solution N/A N/A Pain Control: Callus N/A N/A Tissue Debrided: Non-Viable Tissue N/A N/A Level: 0.01 N/A N/A Debridement A (sq cm): rea Curette N/A N/A Instrument: Minimum N/A  N/A Bleeding: Pressure N/A N/A Hemostasis A chieved: Procedure was tolerated well N/A N/A Debridement Treatment Response: 0.1x0.1x0.1 N/A N/A Post Debridement Measurements L x W x D (cm) 0.001 N/A N/A Post Debridement Volume: (cm) Callus: Yes N/A N/A Periwound Skin Texture: Excoriation: No Induration: No Crepitus: No Rash: No Scarring: No Maceration: No N/A N/A Periwound Skin Moisture: Dry/Scaly: No Atrophie Blanche: No N/A N/A Periwound Skin Color: Cyanosis: No Ecchymosis: No Erythema: No Hemosiderin Staining: No Mottled: No Pallor: No Rubor: No No Abnormality N/A N/A Temperature: Debridement N/A N/A Procedures Performed: Treatment Notes Electronic Signature(s) Signed: 09/30/2023 8:16:24 AM By: [name] MD FACS Entered By: [name] on 09/30/2023 08:16:23 [name] ([id number]) [id number] Page 4 of 7 -------------------------------------------------------------------------------- Multi-Disciplinary Care Plan Details Patient Name: Date of Service: [name] [place], [name] RO LD 09/30/2023 7:45 A M Medical Record Number: [id number] Patient Account Number: [id number] Date of Birth/Sex: Treating RN: 1971/04/01 (52 y.o. [name] Primary Care Jago Carton: [name] Other Clinician: Referring Neema Fluegge: Treating Ericka Marcellus/Extender: [name] in Treatment: 35 Active Inactive Wound/Skin Impairment Nursing Diagnoses: Impaired tissue integrity Knowledge deficit related to ulceration/compromised skin integrity Goals: Patient will have a decrease in wound volume by X% from date: (specify in notes) Date Initiated: 01/24/2023 Target Resolution Date: 12/30/2023 Goal Status: Active Patient/caregiver will verbalize understanding of skin care regimen Date Initiated: 01/24/2023 Target Resolution Date: 12/30/2023 Goal Status: Active Ulcer/skin breakdown will have a volume reduction of 30% by week  4 Date Initiated: 01/24/2023 Date Inactivated: 03/07/2023 Target Resolution Date: 02/27/2023 Goal Status: Unmet Unmet Reason: larger today. Ulcer/skin breakdown will have a volume reduction of 50% by week 8 Date Initiated: 01/24/2023 Target Resolution Date: 12/30/2023 Goal Status: Active Interventions: Assess patient/caregiver ability to obtain necessary supplies Assess patient/caregiver ability to perform ulcer/skin care regimen upon admission and as needed Assess ulceration(s) every visit Notes: Electronic Signature(s) Signed: 09/30/2023 3:42:47 PM By: [name] Entered By: [name] on 09/30/2023 08:04:10 -------------------------------------------------------------------------------- Pain Assessment Details Patient Name: Date of Service: [name] [place], [name] 09/30/2023 7:45 A M Medical Record Number: [id number] Patient Account Number: [id number] Date of Birth/Sex: Treating RN: 1971/12/16 (52 y.o. [name] Primary Care Quran Vasco: [name] Other Clinician: Referring Malka Bocek: Treating Addison Freimuth/Extender: [name] in Treatment: 35 Active Problems Location of Pain Severity and Description of Pain Patient Has Paino No Site Locations Rate the pain. SAIFULLAH, JOLLEY ([id number]) [id number] Page 5 of 7 Rate the pain. Current Pain Level: 0 Pain Management and Medication Current

## 2023-10-10 ENCOUNTER — Other Ambulatory Visit: Payer: Self-pay

## 2023-10-14 ENCOUNTER — Other Ambulatory Visit: Payer: Self-pay

## 2023-10-14 ENCOUNTER — Encounter (HOSPITAL_BASED_OUTPATIENT_CLINIC_OR_DEPARTMENT_OTHER): Payer: 59 | Admitting: General Surgery

## 2023-10-14 DIAGNOSIS — Z955 Presence of coronary angioplasty implant and graft: Secondary | ICD-10-CM | POA: Diagnosis not present

## 2023-10-14 DIAGNOSIS — E1122 Type 2 diabetes mellitus with diabetic chronic kidney disease: Secondary | ICD-10-CM | POA: Diagnosis not present

## 2023-10-14 DIAGNOSIS — Z7984 Long term (current) use of oral hypoglycemic drugs: Secondary | ICD-10-CM | POA: Diagnosis not present

## 2023-10-14 DIAGNOSIS — Z794 Long term (current) use of insulin: Secondary | ICD-10-CM | POA: Diagnosis not present

## 2023-10-14 DIAGNOSIS — Z89422 Acquired absence of other left toe(s): Secondary | ICD-10-CM | POA: Diagnosis not present

## 2023-10-14 DIAGNOSIS — N189 Chronic kidney disease, unspecified: Secondary | ICD-10-CM | POA: Diagnosis not present

## 2023-10-14 DIAGNOSIS — E11621 Type 2 diabetes mellitus with foot ulcer: Secondary | ICD-10-CM | POA: Diagnosis not present

## 2023-10-14 DIAGNOSIS — I252 Old myocardial infarction: Secondary | ICD-10-CM | POA: Diagnosis not present

## 2023-10-14 DIAGNOSIS — I5042 Chronic combined systolic (congestive) and diastolic (congestive) heart failure: Secondary | ICD-10-CM | POA: Diagnosis not present

## 2023-10-14 DIAGNOSIS — L97528 Non-pressure chronic ulcer of other part of left foot with other specified severity: Secondary | ICD-10-CM | POA: Diagnosis not present

## 2023-10-14 DIAGNOSIS — L97522 Non-pressure chronic ulcer of other part of left foot with fat layer exposed: Secondary | ICD-10-CM | POA: Diagnosis not present

## 2023-10-14 DIAGNOSIS — I130 Hypertensive heart and chronic kidney disease with heart failure and stage 1 through stage 4 chronic kidney disease, or unspecified chronic kidney disease: Secondary | ICD-10-CM | POA: Diagnosis not present

## 2023-10-14 DIAGNOSIS — E785 Hyperlipidemia, unspecified: Secondary | ICD-10-CM | POA: Diagnosis not present

## 2023-10-14 DIAGNOSIS — I2510 Atherosclerotic heart disease of native coronary artery without angina pectoris: Secondary | ICD-10-CM | POA: Diagnosis not present

## 2023-10-14 DIAGNOSIS — E1140 Type 2 diabetes mellitus with diabetic neuropathy, unspecified: Secondary | ICD-10-CM | POA: Diagnosis not present

## 2023-10-14 NOTE — Progress Notes (Signed)
for mobility to help relieve the pressure off the wound bed. Wound is smaller today. He wants to hold off on doing the total contact cast as he is showing signs of improvement with wound healing. 7/11; patient presents for follow-up. He has been using Vashe wet-to-dry dressings and his electric scooter for offloading. Wound is slightly smaller. 7/25; patient presents for follow up. He has been using Vashe wet-to-dry dressings and he reports using his electric scooter for offloading. Wound is larger. 7/30; patient presents for follow-up. He has been using Vashe wet-to-dry dressings. He has taken Augmentin as prescribed without issues. Overall wound has healthier granulation tissue. 8/1; patient presents for follow-up. He presents for obligatory cast change. He tolerated the cast well and has no issues or complaints today. We have been using antibiotic ointment with Hydrofera Blue under the cast. Wound  is slightly smaller. 8/8; patient presents for follow-up. We have been using antibiotic ointment with Hydrofera Blue under the total contact cast. Wound is smaller. He has no issues or complaints today. 8/13; patient presents for follow-up. We have been using antibiotic ointment with Hydrofera Blue under the total contact cast. He comes in today because the top part of the cast over the foot has sunken as well as on the side. He wanted to assure that there were no issues. [name], [name] ([id number]) [id number] Page 3 of 11 8/22; patient presents for follow-up. He has been using Hydrofera Blue to the wound bed. Previously we have been using a total contact cast. We do not have total contact casts due to manufacturing issues available for patients. He has no issues or complaints. He denies signs of infection. 8/27; patient presents for follow-up. He has been using Vashe wet-to-dry dressings. T contact cast is available for placement today. He was agreeable to otal proceed with this. He denies signs of infection. 9/3; patient presents for follow-up. We have been using Hydrofera Blue with antibiotic ointment under the total contact cast for the past week. He has tolerated this well. The wound is smaller. Unfortunately we do not have a total contact cast in office today for replacement as there is been manufacturing issues. 9/5; patient was seen earlier this week and plan was for a total contact cast but there is none available. The cast finally arrived in patient has come in today to have this placed. We will continue with Hydrofera Blue and antibiotic ointment under the cast. 9/12; patient presents for follow-up. We have been using Hydrofera Blue and antibiotic ointment under the cast. Wound is stable. He denies signs of infection. 09/17/2023: The wound is measuring smaller today. The surface appears healthy and there is minimal accumulation of slough. 09/23/2023: The wound  external measurements remain about the same, but it is shallower and visually appears smaller. 09/30/2023: The wound is covered with a layer of callus. Underneath the callus, there is a tiny superficial residual opening. 10/14/2023: He returns after being away for 2 weeks, during which he was out of a cast. Not surprisingly, his wound has reopened substantially. It remains clean, however. Electronic Signature(s) Signed: 10/14/2023 8:11:15 AM By: [name] MD FACS Entered By: [name] on 10/14/2023 08:11:15 -------------------------------------------------------------------------------- Physical Exam Details Patient Name: Date of Service: [name] [place], [name] 10/14/2023 7:45 A M Medical Record Number: [id number] Patient Account Number: [id number] Date of Birth/Sex: Treating RN: 02-Oct-1971 (52 y.o. M) Primary Care Provider: [name] Other Clinician: Referring Provider: Treating Provider/Extender: [name] in Treatment: 37 Constitutional . . . [name]  [name], [name] ([id number]) [id number] Page 1 of 11 Visit Report for 10/14/2023 Chief Complaint Document Details Patient Name: Date of Service: [name] [place], [name] RO LD 10/14/2023 7:45 A M Medical Record Number: [id number] Patient Account Number: [id number] Date of Birth/Sex: Treating RN: May 02, 1971 (52 y.o. M) Primary Care Provider: [name] Other Clinician: Referring Provider: Treating Provider/Extender: [name] in Treatment: 78 Information Obtained from: Patient Chief Complaint 01/15/2023; left foot wound Electronic Signature(s) Signed: 10/14/2023 8:09:57 AM By: [name] MD FACS Entered By: [name] on 10/14/2023 08:09:57 -------------------------------------------------------------------------------- HPI Details Patient Name: Date of Service: [name] [place], [name] 10/14/2023 7:45 A M Medical Record Number: [id number] Patient Account Number: [id number] Date of Birth/Sex: Treating RN: 1971-05-11 (53 y.o. M) Primary Care Provider: [name] Other Clinician: Referring Provider: Treating Provider/Extender: [name] in Treatment: 37 History of Present Illness HPI Description: ADMISSION 01/11/2020 This is a 52 year old man with uncontrolled type 2 diabetes with a recent hemoglobin A1c earlier this year of 13.4. He is on insulin and glipizide. He does not take his blood sugars at home. He does have a follow-up with primary care later this month I believe on January 27. He tells [place] that roughly a month ago he was walking with a shoe with a hole in his foot. He took the shoe off and there was an open wound at roughly the left fourth met head. This has significant undermining and raised edges. He has not noticed any purulence he does not feel unwell. More recently he was taking skin off the bottom of his foot and has a superficial area on the left fifth met head. He has not  been offloading this. The patient was in the ER on 12/20. They gave him Bactroban which she has been using on the wound and 10 days worth of doxycycline. No x-rays were done. He has not had vascular studies. He is also been using hydrogen peroxide. Past medical history type 2 diabetes uncontrolled, chronic systolic heart failure, coronary artery disease with a history of congestive heart failure with stents. Hypertension hyperlipidemia and chronic renal insufficiency ABI in this clinic was 1.14 on the left. 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 He is uncertain whether he would be able to work if we put him on some form of restriction 1/19; he is generally doing quite well. Using silver alginate on the wounds. Things actually look better. He has a forefoot offloading boot which she seems to be compliant about. He has support at work to stay off his foot is much as possible which is gratifying. Culture I did last week showed a few Enterococcus faecalis. I am going to put him on Augmentin. I talked about ordering an x-ray in my note last week but that does not seem to have happened. We will review reorder the x-ray this week. 1/26; x-ray reordered last week was negative for osteomyelitis. We are using silver alginate on the wound on the third and fifth met heads on the left. He is using a Darco forefoot off loader 2/2; the area on the fifth met head is closed. Third met head is still open with tunneling depth and thick callus. 2/9; the area on the fifth met head remains closed however the third met head again has a small open area on presentation with tunneling in depth and surrounded by thick callus. This looks like a pressure issue. We have been using silver  10:00:44 AM By: [name] MD FACS Signed: 10/14/2023 3:55:41 PM By: Gelene Mink By: [name] on 10/14/2023  08:00:22 -------------------------------------------------------------------------------- SuperBill Details Patient Name: Date of Service: [name] [place], [name] 10/14/2023 Medical Record Number: [id number] Patient Account Number: [id number] Date of Birth/Sex: Treating RN: [birth year] (52 y.o. 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) Facility Procedures : CPT4 Code: 42595638 Description: [phone] - APPLY TOTAL CONTACT LEG CAST ICD-10 Diagnosis Description L97.522 Non-pressure chronic ulcer of other part of left foot with fat layer exposed Modifier: Quantity: 1 Physician Procedures : CPT4 Code Description Modifier [id number] - WC PHYS LEVEL 4 - EST PT 25 ICD-10 Diagnosis Description L97.522 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.621 Type 2 diabetes mellitus with foot ulcer Z89.422  Acquired absence of other left toe(s) Quantity: 1 : [phone] [id number] - WC PHYS APPLY TOTAL CONTACT CAST ICD-10 Diagnosis Description L97.522 Non-pressure chronic ulcer of other part of left foot with fat layer exposed Quantity: 1 Electronic Signature(s) Signed: 10/14/2023 8:13:06 AM By: [name] MD FACS Entered By: [name] on 10/14/2023 08:13:05  no acute distress. Respiratory Normal work of breathing on room air. Notes 10/14/2023: He returns after being away for 2 weeks, during which he was out of a cast. Not surprisingly, his wound has reopened substantially. It remains clean, however. Electronic Signature(s) Signed: 10/14/2023 8:11:51 AM By: [name] MD FACS Entered By: [name] on 10/14/2023 08:11:51 -------------------------------------------------------------------------------- Physician Orders Details Patient Name: Date of Service: [name] [place], [name] 10/14/2023 7:45 A M Medical Record Number: [id number] Patient Account Number: [id number] Date of Birth/Sex: Treating RN: 12-20-1971 (52 y.o. 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 - Dr. Lady Gary - room 2 Return A Bathing/ Shower/ Hygiene May shower and wash wound with soap and water. - with dressing changes Off-Loading Total Contact Cast to Left Lower Extremity - TCC size 3 heel cup to toes. foam pad to anterior ankle Removable cast walker boot to: - left foot Wound Treatment Wound #3 - Foot Wound Laterality: Plantar, Left Cleanser: Soap and Water 1 x Per Day/30 Days Discharge Instructions: May shower and wash wound with dial antibacterial soap and water prior to dressing change. Cleanser: Vashe 5.8 (oz) 1 x Per Day/30 Days Discharge Instructions: Cleanse the wound with Vashe prior to applying a clean dressing using gauze sponges, not tissue or cotton balls. Topical: Gentamicin 1 x Per Day/30 Days Discharge Instructions: As directed by physician Topical: Mupirocin Ointment 1 x Per Day/30 Days Discharge Instructions: Apply Mupirocin (Bactroban) as instructed Topical: Skintegrity Hydrogel 4 (oz) 1 x Per Day/30 Days Discharge Instructions: Apply hydrogel as directed Prim Dressing: Promogran Prisma Matrix, 4.34 (sq in) (silver collagen) 1 x Per Day/30 Days ary Discharge Instructions: Moisten collagen with saline or hydrogel Secondary Dressing: Woven Gauze Sponge, Non-Sterile 4x4 in 1 x Per Day/30 Days Discharge Instructions: Apply over primary dressing as directed. Secondary Dressing: Zetuvit Plus 4x8 in 1 x Per Day/30 Days Discharge Instructions: Apply over primary dressing as directed. Secured With: [employer], 4.5x3.1 (in/yd) 1 x Per Day/30 Days Discharge Instructions: Secure with Kerlix as directed. Electronic Signature(s) Signed: 10/14/2023 10:00:44 AM By: [name] MD FACS Entered By: [name] on 10/14/2023 08:12:04 -------------------------------------------------------------------------------- Problem List Details Patient Name: Date of Service: [name] [place], [name] 10/14/2023 7:45 A M Medical Record Number: [id number] Patient Account Number: [id number] Date of Birth/Sex: Treating RN: [birth year] (52 y.o. M) Primary Care Provider: [name] Other Clinician: Referring Provider: Treating Provider/Extender: [name] in Treatment: [address] [place], [name] ([id number]) [id number] Page 5 of 11 ICD-10 Encounter Code Description Active Date MDM Diagnosis L97.522 Non-pressure chronic ulcer of other part of left foot with fat layer exposed 01/24/2023 No Yes E11.621 Type 2 diabetes mellitus with foot ulcer 01/24/2023 No Yes Z89.422 Acquired absence of other left toe(s) 01/24/2023 No Yes Inactive Problems Resolved Problems Electronic Signature(s) Signed: 10/14/2023 8:09:41 AM By: [name] MD FACS Entered By: [name] on 10/14/2023 08:09:41 -------------------------------------------------------------------------------- Progress Note Details Patient Name: Date of Service: [name] [place], [name] 10/14/2023 7:45  [name], [name] ([id number]) [id number] Page 1 of 11 Visit Report for 10/14/2023 Chief Complaint Document Details Patient Name: Date of Service: [name] [place], [name] RO LD 10/14/2023 7:45 A M Medical Record Number: [id number] Patient Account Number: [id number] Date of Birth/Sex: Treating RN: May 02, 1971 (52 y.o. M) Primary Care Provider: [name] Other Clinician: Referring Provider: Treating Provider/Extender: [name] in Treatment: 78 Information Obtained from: Patient Chief Complaint 01/15/2023; left foot wound Electronic Signature(s) Signed: 10/14/2023 8:09:57 AM By: [name] MD FACS Entered By: [name] on 10/14/2023 08:09:57 -------------------------------------------------------------------------------- HPI Details Patient Name: Date of Service: [name] [place], [name] 10/14/2023 7:45 A M Medical Record Number: [id number] Patient Account Number: [id number] Date of Birth/Sex: Treating RN: 1971-05-11 (53 y.o. M) Primary Care Provider: [name] Other Clinician: Referring Provider: Treating Provider/Extender: [name] in Treatment: 37 History of Present Illness HPI Description: ADMISSION 01/11/2020 This is a 52 year old man with uncontrolled type 2 diabetes with a recent hemoglobin A1c earlier this year of 13.4. He is on insulin and glipizide. He does not take his blood sugars at home. He does have a follow-up with primary care later this month I believe on January 27. He tells [place] that roughly a month ago he was walking with a shoe with a hole in his foot. He took the shoe off and there was an open wound at roughly the left fourth met head. This has significant undermining and raised edges. He has not noticed any purulence he does not feel unwell. More recently he was taking skin off the bottom of his foot and has a superficial area on the left fifth met head. He has not  been offloading this. The patient was in the ER on 12/20. They gave him Bactroban which she has been using on the wound and 10 days worth of doxycycline. No x-rays were done. He has not had vascular studies. He is also been using hydrogen peroxide. Past medical history type 2 diabetes uncontrolled, chronic systolic heart failure, coronary artery disease with a history of congestive heart failure with stents. Hypertension hyperlipidemia and chronic renal insufficiency ABI in this clinic was 1.14 on the left. 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 He is uncertain whether he would be able to work if we put him on some form of restriction 1/19; he is generally doing quite well. Using silver alginate on the wounds. Things actually look better. He has a forefoot offloading boot which she seems to be compliant about. He has support at work to stay off his foot is much as possible which is gratifying. Culture I did last week showed a few Enterococcus faecalis. I am going to put him on Augmentin. I talked about ordering an x-ray in my note last week but that does not seem to have happened. We will review reorder the x-ray this week. 1/26; x-ray reordered last week was negative for osteomyelitis. We are using silver alginate on the wound on the third and fifth met heads on the left. He is using a Darco forefoot off loader 2/2; the area on the fifth met head is closed. Third met head is still open with tunneling depth and thick callus. 2/9; the area on the fifth met head remains closed however the third met head again has a small open area on presentation with tunneling in depth and surrounded by thick callus. This looks like a pressure issue. We have been using silver  10:00:44 AM By: [name] MD FACS Signed: 10/14/2023 3:55:41 PM By: Gelene Mink By: [name] on 10/14/2023  08:00:22 -------------------------------------------------------------------------------- SuperBill Details Patient Name: Date of Service: [name] [place], [name] 10/14/2023 Medical Record Number: [id number] Patient Account Number: [id number] Date of Birth/Sex: Treating RN: [birth year] (52 y.o. 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) Facility Procedures : CPT4 Code: [id number] Description: [phone] - APPLY TOTAL CONTACT LEG CAST ICD-10 Diagnosis Description L97.522 Non-pressure chronic ulcer of other part of left foot with fat layer exposed Modifier: Quantity: 1 Physician Procedures : CPT4 Code Description Modifier [id number] - WC PHYS LEVEL 4 - EST PT 25 ICD-10 Diagnosis Description L97.522 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.621 Type 2 diabetes mellitus with foot ulcer Z89.422  Acquired absence of other left toe(s) Quantity: 1 : [phone] [id number] - WC PHYS APPLY TOTAL CONTACT CAST ICD-10 Diagnosis Description L97.522 Non-pressure chronic ulcer of other part of left foot with fat layer exposed Quantity: 1 Electronic Signature(s) Signed: 10/14/2023 8:13:06 AM By: [name] MD FACS Entered By: [name] on 10/14/2023 08:13:05  [name], [name] ([id number]) [id number] Page 1 of 11 Visit Report for 10/14/2023 Chief Complaint Document Details Patient Name: Date of Service: [name] [place], [name] RO LD 10/14/2023 7:45 A M Medical Record Number: [id number] Patient Account Number: [id number] Date of Birth/Sex: Treating RN: May 02, 1971 (52 y.o. M) Primary Care Provider: [name] Other Clinician: Referring Provider: Treating Provider/Extender: [name] in Treatment: 78 Information Obtained from: Patient Chief Complaint 01/15/2023; left foot wound Electronic Signature(s) Signed: 10/14/2023 8:09:57 AM By: [name] MD FACS Entered By: [name] on 10/14/2023 08:09:57 -------------------------------------------------------------------------------- HPI Details Patient Name: Date of Service: [name] [place], [name] 10/14/2023 7:45 A M Medical Record Number: [id number] Patient Account Number: [id number] Date of Birth/Sex: Treating RN: 1971-05-11 (53 y.o. M) Primary Care Provider: [name] Other Clinician: Referring Provider: Treating Provider/Extender: [name] in Treatment: 37 History of Present Illness HPI Description: ADMISSION 01/11/2020 This is a 52 year old man with uncontrolled type 2 diabetes with a recent hemoglobin A1c earlier this year of 13.4. He is on insulin and glipizide. He does not take his blood sugars at home. He does have a follow-up with primary care later this month I believe on January 27. He tells [place] that roughly a month ago he was walking with a shoe with a hole in his foot. He took the shoe off and there was an open wound at roughly the left fourth met head. This has significant undermining and raised edges. He has not noticed any purulence he does not feel unwell. More recently he was taking skin off the bottom of his foot and has a superficial area on the left fifth met head. He has not  been offloading this. The patient was in the ER on 12/20. They gave him Bactroban which she has been using on the wound and 10 days worth of doxycycline. No x-rays were done. He has not had vascular studies. He is also been using hydrogen peroxide. Past medical history type 2 diabetes uncontrolled, chronic systolic heart failure, coronary artery disease with a history of congestive heart failure with stents. Hypertension hyperlipidemia and chronic renal insufficiency ABI in this clinic was 1.14 on the left. 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 He is uncertain whether he would be able to work if we put him on some form of restriction 1/19; he is generally doing quite well. Using silver alginate on the wounds. Things actually look better. He has a forefoot offloading boot which she seems to be compliant about. He has support at work to stay off his foot is much as possible which is gratifying. Culture I did last week showed a few Enterococcus faecalis. I am going to put him on Augmentin. I talked about ordering an x-ray in my note last week but that does not seem to have happened. We will review reorder the x-ray this week. 1/26; x-ray reordered last week was negative for osteomyelitis. We are using silver alginate on the wound on the third and fifth met heads on the left. He is using a Darco forefoot off loader 2/2; the area on the fifth met head is closed. Third met head is still open with tunneling depth and thick callus. 2/9; the area on the fifth met head remains closed however the third met head again has a small open area on presentation with tunneling in depth and surrounded by thick callus. This looks like a pressure issue. We have been using silver  for mobility to help relieve the pressure off the wound bed. Wound is smaller today. He wants to hold off on doing the total contact cast as he is showing signs of improvement with wound healing. 7/11; patient presents for follow-up. He has been using Vashe wet-to-dry dressings and his electric scooter for offloading. Wound is slightly smaller. 7/25; patient presents for follow up. He has been using Vashe wet-to-dry dressings and he reports using his electric scooter for offloading. Wound is larger. 7/30; patient presents for follow-up. He has been using Vashe wet-to-dry dressings. He has taken Augmentin as prescribed without issues. Overall wound has healthier granulation tissue. 8/1; patient presents for follow-up. He presents for obligatory cast change. He tolerated the cast well and has no issues or complaints today. We have been using antibiotic ointment with Hydrofera Blue under the cast. Wound  is slightly smaller. 8/8; patient presents for follow-up. We have been using antibiotic ointment with Hydrofera Blue under the total contact cast. Wound is smaller. He has no issues or complaints today. 8/13; patient presents for follow-up. We have been using antibiotic ointment with Hydrofera Blue under the total contact cast. He comes in today because the top part of the cast over the foot has sunken as well as on the side. He wanted to assure that there were no issues. [name], [name] ([id number]) [id number] Page 3 of 11 8/22; patient presents for follow-up. He has been using Hydrofera Blue to the wound bed. Previously we have been using a total contact cast. We do not have total contact casts due to manufacturing issues available for patients. He has no issues or complaints. He denies signs of infection. 8/27; patient presents for follow-up. He has been using Vashe wet-to-dry dressings. T contact cast is available for placement today. He was agreeable to otal proceed with this. He denies signs of infection. 9/3; patient presents for follow-up. We have been using Hydrofera Blue with antibiotic ointment under the total contact cast for the past week. He has tolerated this well. The wound is smaller. Unfortunately we do not have a total contact cast in office today for replacement as there is been manufacturing issues. 9/5; patient was seen earlier this week and plan was for a total contact cast but there is none available. The cast finally arrived in patient has come in today to have this placed. We will continue with Hydrofera Blue and antibiotic ointment under the cast. 9/12; patient presents for follow-up. We have been using Hydrofera Blue and antibiotic ointment under the cast. Wound is stable. He denies signs of infection. 09/17/2023: The wound is measuring smaller today. The surface appears healthy and there is minimal accumulation of slough. 09/23/2023: The wound  external measurements remain about the same, but it is shallower and visually appears smaller. 09/30/2023: The wound is covered with a layer of callus. Underneath the callus, there is a tiny superficial residual opening. 10/14/2023: He returns after being away for 2 weeks, during which he was out of a cast. Not surprisingly, his wound has reopened substantially. It remains clean, however. Electronic Signature(s) Signed: 10/14/2023 8:11:15 AM By: [name] MD FACS Entered By: [name] on 10/14/2023 08:11:15 -------------------------------------------------------------------------------- Physical Exam Details Patient Name: Date of Service: [name] IN, [name] 10/14/2023 7:45 A M Medical Record Number: [id number] Patient Account Number: [id number] Date of Birth/Sex: Treating RN: 02-Oct-1971 (52 y.o. M) Primary Care Provider: [name] Other Clinician: Referring Provider: Treating Provider/Extender: [name] in Treatment: 37 Constitutional . . . [name]  Secured With: [employer], 4.5x3.1 (in/yd) 1 x Per Day/30 Days Discharge Instructions: Secure with Kerlix as directed. 10/14/2023: He returns after being away for 2 weeks, during which he was out of a cast. Not surprisingly, his wound has reopened substantially. It remains clean, however. The wound bed was prepared for total contact casting. T opical gentamicin and mupirocin were applied, followed by Prisma silver collagen. A total contact cast was then applied in standard fashion. He will follow-up in 1 week. [name], [name] ([id number]) [id number] Page 9 of 11 Electronic Signature(s) Signed: 10/14/2023 8:12:46 AM By: [name] MD FACS Entered By: [name] on 10/14/2023 08:12:45 -------------------------------------------------------------------------------- HxROS Details Patient Name: Date of Service: [name] IN, [name] 10/14/2023 7:45 A M Medical Record Number: [id number] Patient Account Number: [id number] Date of Birth/Sex: Treating RN: Dec 26, 1971 (52 y.o. M) Primary Care Provider: [name] Other Clinician: Referring Provider: Treating Provider/Extender: [name] in Treatment: 37 Information  Obtained From Patient Eyes Medical History: Negative for: Cataracts; Glaucoma; Optic Neuritis Ear/Nose/Mouth/Throat Medical History: Positive for: Chronic sinus problems/congestion Negative for: Middle ear problems Hematologic/Lymphatic Medical History: Negative for: Anemia; Hemophilia; Human Immunodeficiency Virus; Lymphedema; Sickle Cell Disease Respiratory Medical History: Negative for: Aspiration; Asthma; Chronic Obstructive Pulmonary Disease (COPD); Pneumothorax; Sleep Apnea; Tuberculosis Cardiovascular Medical History: Positive for: Congestive Heart Failure; Coronary Artery Disease; Hypertension; Myocardial Infarction - age 58 Negative for: Angina; Arrhythmia; Deep Vein Thrombosis; Hypotension; Peripheral Arterial Disease; Peripheral Venous Disease; Phlebitis; Vasculitis Past Medical History Notes: Ischemic Cardiomyopathy Gastrointestinal Medical History: Negative for: Cirrhosis ; Colitis; Crohns; Hepatitis A; Hepatitis B; Hepatitis C Endocrine Medical History: Positive for: Type II Diabetes Time with diabetes: ten years Treated with: Insulin Blood sugar tested every day: No Genitourinary Medical History: Negative for: End Stage Renal Disease Past Medical History Notes: Renal Insufficiency Immunological [name], [name] ([id number]) [id number] Page 10 of 11 Medical History: Negative for: Lupus Erythematosus; Raynauds; Scleroderma Integumentary (Skin) Medical History: Negative for: History of Burn Musculoskeletal Medical History: Negative for: Gout; Rheumatoid Arthritis; Osteoarthritis; Osteomyelitis Neurologic Medical History: Positive for: Neuropathy Negative for: Dementia; Quadriplegia; Paraplegia; Seizure Disorder Oncologic Medical History: Negative for: Received Chemotherapy; Received Radiation Psychiatric Medical History: Negative for: Anorexia/bulimia; Confinement Anxiety HBO Extended History  Items Ear/Nose/Mouth/Throat: Chronic sinus problems/congestion Immunizations Pneumococcal Vaccine: Received Pneumococcal Vaccination: No Implantable Devices None Hospitalization / Surgery History Type of Hospitalization/Surgery Heart Cath in 2018 Family and Social History Cancer: No; Diabetes: No; Heart Disease: Yes - Mother,Father; Hereditary Spherocytosis: No; Hypertension: No; Kidney Disease: No; Lung Disease: No; Seizures: No; Stroke: No; Thyroid Problems: No; Tuberculosis: No; Never smoker; Marital Status - Single; Alcohol Use: Rarely; Drug Use: No History; Caffeine Use: Rarely; Financial Concerns: No; Food, Clothing or Shelter Needs: No; Support System Lacking: No; Transportation Concerns: No Electronic Signature(s) Signed: 10/14/2023 10:00:44 AM By: [name] MD FACS Entered By: [name] on 10/14/2023 08:11:22 -------------------------------------------------------------------------------- Total Contact Cast Details Patient Name: Date of Service: [name] IN, [name] 10/14/2023 7:45 A M Medical Record Number: [id number] Patient Account Number: [id number] Date of Birth/Sex: Treating RN: [birth year] (52 y.o. [name] Primary Care Provider: [name] Other Clinician: Referring Provider: Treating Provider/Extender: [name] in Treatment: 37 T Contact Cast Applied for Wound Assessment: otal Wound #3 Left,Plantar Foot Performed By: Physician [name], MD The following information was scribed by: [name] The information was scribed for: Kaycee, Mcgaugh, [name] ([id number]) [id number] Page 11 of 11 Post Procedure Diagnosis Same as Pre-procedure Electronic Signature(s) Signed: 10/14/2023  no acute distress. Respiratory Normal work of breathing on room air. Notes 10/14/2023: He returns after being away for 2 weeks, during which he was out of a cast. Not surprisingly, his wound has reopened substantially. It remains clean, however. Electronic Signature(s) Signed: 10/14/2023 8:11:51 AM By: [name] MD FACS Entered By: [name] on 10/14/2023 08:11:51 -------------------------------------------------------------------------------- Physician Orders Details Patient Name: Date of Service: [name] IN, [name] 10/14/2023 7:45 A M Medical Record Number: [id number] Patient Account Number: [id number] Date of Birth/Sex: Treating RN: 12-20-1971 (52 y.o. 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 - Dr. Lady Gary - room 2 Return A Bathing/ Shower/ Hygiene May shower and wash wound with soap and water. - with dressing changes Off-Loading Total Contact Cast to Left Lower Extremity - TCC size 3 heel cup to toes. foam pad to anterior ankle Removable cast walker boot to: - left foot Wound Treatment Wound #3 - Foot Wound Laterality: Plantar, Left Cleanser: Soap and Water 1 x Per Day/30 Days Discharge Instructions: May shower and wash wound with dial antibacterial soap and water prior to dressing change. Cleanser: Vashe 5.8 (oz) 1 x Per Day/30 Days Discharge Instructions: Cleanse the wound with Vashe prior to applying a clean dressing using gauze sponges, not tissue or cotton balls. Topical: Gentamicin 1 x Per Day/30 Days Discharge Instructions: As directed by physician Topical: Mupirocin Ointment 1 x Per Day/30 Days Discharge Instructions: Apply Mupirocin (Bactroban) as instructed Topical: Skintegrity Hydrogel 4 (oz) 1 x Per Day/30 Days Discharge Instructions: Apply hydrogel as directed Prim Dressing: Promogran Prisma Matrix, 4.34 (sq in) (silver collagen) 1 x Per Day/30 Days ary Discharge Instructions: Moisten collagen with saline or hydrogel Secondary Dressing: Woven Gauze Sponge, Non-Sterile 4x4 in 1 x Per Day/30 Days Discharge Instructions: Apply over primary dressing as directed. Secondary Dressing: Zetuvit Plus 4x8 in 1 x Per Day/30 Days Discharge Instructions: Apply over primary dressing as directed. Secured With: [employer], 4.5x3.1 (in/yd) 1 x Per Day/30 Days Discharge Instructions: Secure with Kerlix as directed. Electronic Signature(s) Signed: 10/14/2023 10:00:44 AM By: [name] MD FACS Entered By: [name] on 10/14/2023 08:12:04 -------------------------------------------------------------------------------- Problem List Details Patient Name: Date of Service: [name] IN, [name] 10/14/2023 7:45 A M Medical Record Number: [id number] Patient Account Number: [id number] Date of Birth/Sex: Treating RN: [birth year] (52 y.o. M) Primary Care Provider: [name] Other Clinician: Referring Provider: Treating Provider/Extender: [name] in Treatment: [address] 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 10/14/2023: He returns after being away for 2 weeks, during which he was out of a cast. Not surprisingly, his wound has reopened substantially. It remains clean, however. The wound bed was prepared for total contact casting. T opical gentamicin and mupirocin were applied, followed by Prisma silver collagen. A total contact cast was then applied in standard fashion. He will follow-up in 1 week. [name], [name] ([id number]) [id number] Page 9 of 11 Electronic Signature(s) Signed: 10/14/2023 8:12:46 AM By: [name] MD FACS Entered By: [name] on 10/14/2023 08:12:45 -------------------------------------------------------------------------------- HxROS Details Patient Name: Date of Service: [name] IN, [name] 10/14/2023 7:45 A M Medical Record Number: [id number] Patient Account Number: [id number] Date of Birth/Sex: Treating RN: Dec 26, 1971 (52 y.o. M) Primary Care Provider: [name] Other Clinician: Referring Provider: Treating Provider/Extender: [name] in Treatment: 37 Information  Obtained From Patient Eyes Medical History: Negative for: Cataracts; Glaucoma; Optic Neuritis Ear/Nose/Mouth/Throat Medical History: Positive for: Chronic sinus problems/congestion Negative for: Middle ear problems Hematologic/Lymphatic Medical History: Negative for: Anemia; Hemophilia; Human Immunodeficiency Virus; Lymphedema; Sickle Cell Disease Respiratory Medical History: Negative for: Aspiration; Asthma; Chronic Obstructive Pulmonary Disease (COPD); Pneumothorax; Sleep Apnea; Tuberculosis Cardiovascular Medical History: Positive for: Congestive Heart Failure; Coronary Artery Disease; Hypertension; Myocardial Infarction - age 58 Negative for: Angina; Arrhythmia; Deep Vein Thrombosis; Hypotension; Peripheral Arterial Disease; Peripheral Venous Disease; Phlebitis; Vasculitis Past Medical History Notes: Ischemic Cardiomyopathy Gastrointestinal Medical History: Negative for: Cirrhosis ; Colitis; Crohns; Hepatitis A; Hepatitis B; Hepatitis C Endocrine Medical History: Positive for: Type II Diabetes Time with diabetes: ten years Treated with: Insulin Blood sugar tested every day: No Genitourinary Medical History: Negative for: End Stage Renal Disease Past Medical History Notes: Renal Insufficiency Immunological [name], [name] ([id number]) [id number] Page 10 of 11 Medical History: Negative for: Lupus Erythematosus; Raynauds; Scleroderma Integumentary (Skin) Medical History: Negative for: History of Burn Musculoskeletal Medical History: Negative for: Gout; Rheumatoid Arthritis; Osteoarthritis; Osteomyelitis Neurologic Medical History: Positive for: Neuropathy Negative for: Dementia; Quadriplegia; Paraplegia; Seizure Disorder Oncologic Medical History: Negative for: Received Chemotherapy; Received Radiation Psychiatric Medical History: Negative for: Anorexia/bulimia; Confinement Anxiety HBO Extended History  Items Ear/Nose/Mouth/Throat: Chronic sinus problems/congestion Immunizations Pneumococcal Vaccine: Received Pneumococcal Vaccination: No Implantable Devices None Hospitalization / Surgery History Type of Hospitalization/Surgery Heart Cath in 2018 Family and Social History Cancer: No; Diabetes: No; Heart Disease: Yes - Mother,Father; Hereditary Spherocytosis: No; Hypertension: No; Kidney Disease: No; Lung Disease: No; Seizures: No; Stroke: No; Thyroid Problems: No; Tuberculosis: No; Never smoker; Marital Status - Single; Alcohol Use: Rarely; Drug Use: No History; Caffeine Use: Rarely; Financial Concerns: No; Food, Clothing or Shelter Needs: No; Support System Lacking: No; Transportation Concerns: No Electronic Signature(s) Signed: 10/14/2023 10:00:44 AM By: [name] MD FACS Entered By: [name] on 10/14/2023 08:11:22 -------------------------------------------------------------------------------- Total Contact Cast Details Patient Name: Date of Service: [name] IN, [name] 10/14/2023 7:45 A M Medical Record Number: [id number] Patient Account Number: [id number] Date of Birth/Sex: Treating RN: [birth year] (52 y.o. [name] Primary Care Provider: [name] Other Clinician: Referring Provider: Treating Provider/Extender: [name] in Treatment: 37 T Contact Cast Applied for Wound Assessment: otal Wound #3 Left,Plantar Foot Performed By: Physician [name], MD The following information was scribed by: [name] The information was scribed for: Kaycee, Mcgaugh, [name] ([id number]) [id number] Page 11 of 11 Post Procedure Diagnosis Same as Pre-procedure Electronic Signature(s) Signed: 10/14/2023  10:00:44 AM By: [name] MD FACS Signed: 10/14/2023 3:55:41 PM By: Gelene Mink By: [name] on 10/14/2023  08:00:22 -------------------------------------------------------------------------------- SuperBill Details Patient Name: Date of Service: [name] IN, [name] 10/14/2023 Medical Record Number: [id number] Patient Account Number: [id number] Date of Birth/Sex: Treating RN: [birth year] (52 y.o. 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) Facility Procedures : CPT4 Code: [id number] Description: [phone] - APPLY TOTAL CONTACT LEG CAST ICD-10 Diagnosis Description L97.522 Non-pressure chronic ulcer of other part of left foot with fat layer exposed Modifier: Quantity: 1 Physician Procedures : CPT4 Code Description Modifier [id number] - WC PHYS LEVEL 4 - EST PT 25 ICD-10 Diagnosis Description L97.522 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.621 Type 2 diabetes mellitus with foot ulcer Z89.422  Acquired absence of other left toe(s) Quantity: 1 : [phone] [id number] - WC PHYS APPLY TOTAL CONTACT CAST ICD-10 Diagnosis Description L97.522 Non-pressure chronic ulcer of other part of left foot with fat layer exposed Quantity: 1 Electronic Signature(s) Signed: 10/14/2023 8:13:06 AM By: [name] MD FACS Entered By: [name] on 10/14/2023 08:13:05

## 2023-10-14 NOTE — Progress Notes (Signed)
[name], [name] ([id number]) [id number] Page 1 of 7 Visit Report for 10/14/2023 Arrival Information Details Patient Name: Date of Service: [name] [place], [name] RO LD 10/14/2023 7:45 A M Medical Record Number: [id number] Patient Account Number: [id number] Date of Birth/Sex: Treating RN: November 29, 1971 (52 y.o. [name] Primary Care Gerell Fortson: [name] Other Clinician: Referring Billie Intriago: Treating Myiah Petkus/Extender: [name] in Treatment: 37 Visit Information History Since Last Visit Added or deleted any medications: No Patient Arrived: Ambulatory Any new allergies or adverse reactions: No Arrival Time: 07:51 Had a fall or experienced change in No Accompanied By: self activities of daily living that may affect Transfer Assistance: None risk of falls: Patient Identification Verified: Yes Signs or symptoms of abuse/neglect since last visito No Secondary Verification Process Completed: Yes Hospitalized since last visit: No Patient Requires Transmission-Based Precautions: No Implantable device outside of the clinic excluding No Patient Has Alerts: Yes cellular tissue based products placed in the center Patient Alerts: 08/2021 ABI:1.2 TBI 0.79 since last visit: Has Dressing in Place as Prescribed: No Pain Present Now: No Electronic Signature(s) Signed: 10/14/2023 3:55:41 PM By: [name] Entered By: [name] on 10/14/2023 07:51:52 -------------------------------------------------------------------------------- Encounter Discharge Information Details Patient Name: Date of Service: [name], [name] 10/14/2023 7:45 A M Medical Record Number: [id number] Patient Account Number: [id number] Date of Birth/Sex: Treating RN: 1971/02/23 (52 y.o. [name] Primary Care [name]: [name] Other Clinician: Referring [name]: Treating [name]/Extender: [name] in Treatment: 37 Encounter Discharge Information Items Discharge Condition: Stable Ambulatory Status: Ambulatory Discharge Destination: Home Transportation: Private Auto Accompanied By: self Schedule Follow-up Appointment: Yes Clinical Summary of Care: Patient Declined Electronic Signature(s) Signed: 10/14/2023 3:55:41 PM By: Gelene Mink By: [name] on 10/14/2023 62:95:28 [name] ([id number]) [id number] Page 2 of 7 -------------------------------------------------------------------------------- Lower Extremity Assessment Details Patient Name: Date of Service: [name] [name], [name] RO LD 10/14/2023 7:45 A M Medical Record Number: [id number] Patient Account Number: [id number] Date of Birth/Sex: Treating RN: [birth year] (52 y.o. [name] Primary Care Finis Hendricksen: [name] Other Clinician: Referring Arnetia Bronk: Treating Deanne Bedgood/Extender: [name] in Treatment: 37 Edema Assessment Assessed: [name]: No] [name]: No] Edema: [Left: N] [Right: o] Calf Left: Right: Point of Measurement: 43 cm From Medial Instep 36.5 cm Ankle Left: Right: Point of Measurement: 9 cm From Medial Instep 20.8 cm Vascular Assessment Extremity colors, hair growth, and conditions: Extremity Color: [Left:Normal] Hair Growth on Extremity: [Left:Yes] Temperature of Extremity: [Left:Warm] Capillary Refill: [Left:< 3 seconds] Dependent Rubor: [Left:No No] Electronic Signature(s) Signed: 10/14/2023 3:55:41 PM By: [name] Entered By: [name] on 10/14/2023 07:50:29 -------------------------------------------------------------------------------- Multi Wound Chart Details Patient Name: Date of Service: [name], [name] 10/14/2023 7:45 A M Medical Record Number: [id number] Patient Account Number: [id number] Date of Birth/Sex: Treating RN: 1971-08-02 (52 y.o. M) Primary Care  [name]: [name] Other Clinician: Referring [name]: Treating [name]/Extender: [name] in Treatment: 37 Vital Signs Height(in): 78 Pulse(bpm): 91 Weight(lbs): 275 Blood Pressure(mmHg): 127/86 Body Mass Index(BMI): 31.8 Temperature(F): 97.6 Respiratory Rate(breaths/min): 16 [3:Photos:] [N/A:N/A] Left, Plantar Foot N/A N/A Wound Location: Gradually Appeared N/A N/A Wounding Event: Diabetic Wound/Ulcer of the Lower N/A N/A Primary Etiology: Extremity Chronic sinus problems/congestion, N/A N/A Comorbid History: Congestive Heart Failure, Coronary Artery Disease, Hypertension, Myocardial Infarction, Type II Diabetes, Neuropathy 10/01/2022 N/A N/A Date Acquired: 37 N/A N/A Weeks of Treatment: Open N/A N/A Wound Status: No N/A N/A Wound Recurrence: Yes N/A N/A  Pending [name] on Presentation: 0.8x0.7x0.1 N/A N/A Measurements L x W x D (cm) 0.44 N/A N/A A (cm) : rea 0.044 N/A N/A Volume (cm) : 88.80% N/A N/A % Reduction in A rea: 94.40% N/A N/A % Reduction in Volume: Grade 1 N/A N/A Classification: Medium N/A N/A Exudate A mount: Serosanguineous N/A N/A Exudate Type: red, brown N/A N/A Exudate Color: Thickened N/A N/A Wound Margin: Large (67-100%) N/A N/A Granulation A mount: Red N/A N/A Granulation Quality: Small (1-33%) N/A N/A Necrotic A mount: Fat Layer (Subcutaneous Tissue): Yes N/A N/A Exposed Structures: Fascia: No Tendon: No Muscle: No Joint: No Bone: No Small (1-33%) N/A N/A Epithelialization: Callus: Yes N/A N/A Periwound Skin Texture: Excoriation: No Induration: No Crepitus: No Rash: No Scarring: No Maceration: No N/A N/A Periwound Skin Moisture: Dry/Scaly: No Atrophie Blanche: No N/A N/A Periwound Skin Color: Cyanosis: No Ecchymosis: No Erythema: No Hemosiderin Staining: No Mottled: No Pallor: No Rubor: No No Abnormality N/A N/A Temperature: T Contact Cast otal N/A  N/A Procedures Performed: Treatment Notes Electronic Signature(s) Signed: 10/14/2023 8:09:50 AM By: Glenn Guess MD FACS Entered By: [name] on 10/14/2023 08:09:50 -------------------------------------------------------------------------------- Multi-Disciplinary Care Plan Details Patient Name: Date of Service: [name], [name] 10/14/2023 7:45 A M Medical Record Number: [id number] Patient Account Number: [id number] Date of Birth/Sex: Treating RN: Aug 24, 1971 (52 y.o. [name] Primary Care Kail Fraley: [name] Other Clinician: Referring Nasier Thumm: Treating Trayshawn Durkin/Extender: [name] in Treatment: [address], [place] ([id number]) [id number] Page 4 of 7 Active Inactive Wound/Skin Impairment Nursing Diagnoses: Impaired tissue integrity Knowledge deficit related to ulceration/compromised skin integrity Goals: Patient will have a decrease in wound volume by X% from date: (specify in notes) Date Initiated: 01/24/2023 Target Resolution Date: 12/30/2023 Goal Status: Active Patient/caregiver will verbalize understanding of skin care regimen Date Initiated: 01/24/2023 Target Resolution Date: 12/30/2023 Goal Status: Active Ulcer/skin breakdown will have a volume reduction of 30% by week 4 Date Initiated: 01/24/2023 Date Inactivated: 03/07/2023 Target Resolution Date: 02/27/2023 Goal Status: Unmet Unmet Reason: larger today. Ulcer/skin breakdown will have a volume reduction of 50% by week 8 Date Initiated: 01/24/2023 Target Resolution Date: 12/30/2023 Goal Status: Active Interventions: Assess patient/caregiver ability to obtain necessary supplies Assess patient/caregiver ability to perform ulcer/skin care regimen upon admission and as needed Assess ulceration(s) every visit Notes: Electronic Signature(s) Signed: 10/14/2023 3:55:41 PM By: [name] Entered By: [name] on  10/14/2023 07:50:37 -------------------------------------------------------------------------------- Pain Assessment Details Patient Name: Date of Service: [name], [name] 10/14/2023 7:45 A M Medical Record Number: [id number] Patient Account Number: [id number] Date of Birth/Sex: Treating RN: [birth year] (52 y.o. [name] Primary Care Quenten Nawaz: [name] Other Clinician: Referring Correy Weidner: Treating Francesco Provencal/Extender: [name] in Treatment: 37 Active Problems Location of Pain Severity and Description of Pain Patient Has Paino No Site Locations Rate the pain. Current Pain Level: 0 COMMIE, FENDLEY ([id number]) [id number] Page 5 of 7 Pain Management and Medication Current Pain Management: Electronic Signature(s) Signed: 10/14/2023 3:55:41 PM By: [name] Entered By: [name] on 10/14/2023 07:52:13 -------------------------------------------------------------------------------- Patient/Caregiver Education Details Patient Name: Date of Service: [name] [place], [name] 10/15/2024andnbsp7:45 A M Medical Record Number: [id number] Patient Account Number: [id number] Date of Birth/Gender: Treating RN: 1971/01/20 (52 y.o. [name] Primary Care Physician: [name] Other Clinician: Referring Physician: Treating Physician/Extender: [name] in Treatment: 37 Education Assessment Education Provided To: Patient Education Topics Provided Safety: Methods: Explain/Verbal Responses: Reinforcements needed, State content  correctly Electronic Signature(s) Signed: 10/14/2023 3:55:41 PM By: [name] Entered By: [name] on 10/14/2023 07:50:52 -------------------------------------------------------------------------------- Wound Assessment Details Patient Name: Date of Service: [name], [name] 10/14/2023 7:45 A M Medical Record  Number: [id number] Patient Account Number: [id number] Date of Birth/Sex: Treating RN: [birth year] (52 y.o. [name] Primary Care Simrin Vegh: [name] Other Clinician: Referring Ercel Pepitone: Treating Jaryah Aracena/Extender: [name] in Treatment: 37 Wound Status Wound Number: 3 Primary Diabetic Wound/Ulcer of the Lower Extremity Etiology: Wound Location: Left, Plantar Foot Wound Open Wounding Event: Gradually Appeared Status: Date Acquired: 10/01/2022 Comorbid Chronic sinus problems/congestion, Congestive Heart Failure, Weeks Of Treatment: 37 History: Coronary Artery Disease, Hypertension, Myocardial Infarction, Type Clustered Wound: No II Diabetes, Neuropathy Pending Amputation On Presentation Wound under treatment by Ajanay Farve outside of [facility] [place] , [place] ([id number]) [id number] Page 6 of 7 Wound Measurements Length: (cm) 0.8 Width: (cm) 0.7 Depth: (cm) 0.1 Area: (cm) 0.44 Volume: (cm) 0.044 % Reduction in Area: 88.8% % Reduction in Volume: 94.4% Epithelialization: Small (1-33%) Tunneling: No Undermining: No Wound Description Classification: Grade 1 Wound Margin: Thickened Exudate Amount: Medium Exudate Type: Serosanguineous Exudate Color: red, brown Foul Odor After Cleansing: No Slough/Fibrino Yes Wound Bed Granulation Amount: Large (67-100%) Exposed Structure Granulation Quality: Red Fascia Exposed: No Necrotic Amount: Small (1-33%) Fat Layer (Subcutaneous Tissue) Exposed: Yes Necrotic Quality: Adherent Slough Tendon Exposed: No Muscle Exposed: No Joint Exposed: No Bone Exposed: No Periwound Skin Texture Texture Color No Abnormalities Noted: No No Abnormalities Noted: Yes Callus: Yes Temperature / Pain Crepitus: No Temperature: No Abnormality Excoriation: No Induration: No Rash: No Scarring: No Moisture No Abnormalities Noted: Yes Treatment Notes Wound #3 (Foot)  Wound Laterality: Plantar, Left Cleanser Soap and Water Discharge Instruction: May shower and wash wound with dial antibacterial soap and water prior to dressing change. Vashe 5.8 (oz) Discharge Instruction: Cleanse the wound with Vashe prior to applying a clean dressing using gauze sponges, not tissue or cotton balls. Peri-Wound Care Topical Gentamicin Discharge Instruction: As directed by physician Mupirocin Ointment Discharge Instruction: Apply Mupirocin (Bactroban) as instructed Skintegrity Hydrogel 4 (oz) Discharge Instruction: Apply hydrogel as directed Primary Dressing Promogran Prisma Matrix, 4.34 (sq in) (silver collagen) Discharge Instruction: Moisten collagen with saline or hydrogel Secondary Dressing JAHSAI, PENNACHIO ([id number]) [id number] Page 7 of 7 Woven Gauze Sponge, Non-Sterile 4x4 in Discharge Instruction: Apply over primary dressing as directed. Zetuvit Plus 4x8 in Discharge Instruction: Apply over primary dressing as directed. Secured With [employer], 4.5x3.1 (in/yd) Discharge Instruction: Secure with Kerlix as directed. Compression Wrap Compression Stockings Add-Ons Electronic Signature(s) Signed: 10/14/2023 3:55:41 PM By: [name] Entered By: [name] on 10/14/2023 07:57:48 -------------------------------------------------------------------------------- Vitals Details Patient Name: Date of Service: [name], [name] 10/14/2023 7:45 A M Medical Record Number: [id number] Patient Account Number: [id number] Date of Birth/Sex: Treating RN: Apr 29, 1971 (52 y.o. [name] Primary Care Tara Rud: [name] Other Clinician: Referring Meghana Tullo: Treating Anvay Tennis/Extender: [name] in Treatment: 37 Vital Signs Time Taken: 07:51 Temperature (F): 97.6 Height (in): 78 Pulse (bpm): 91 Weight (lbs): 275 Respiratory Rate (breaths/min): 16 Body Mass Index  (BMI): 31.8 Blood Pressure (mmHg): 127/86 Reference Range: 80 - 120 mg / dl Electronic Signature(s) Signed: 10/14/2023 3:55:41 PM By: [name] Entered By: [name] on 10/14/2023 07:52:08

## 2023-10-15 ENCOUNTER — Ambulatory Visit: Payer: 59 | Attending: Critical Care Medicine | Admitting: Family Medicine

## 2023-10-15 ENCOUNTER — Other Ambulatory Visit: Payer: Self-pay

## 2023-10-15 ENCOUNTER — Encounter: Payer: Self-pay | Admitting: Family Medicine

## 2023-10-15 VITALS — BP 104/73 | HR 85 | Ht 78.0 in | Wt 295.2 lb

## 2023-10-15 DIAGNOSIS — I509 Heart failure, unspecified: Secondary | ICD-10-CM

## 2023-10-15 DIAGNOSIS — E1165 Type 2 diabetes mellitus with hyperglycemia: Secondary | ICD-10-CM | POA: Diagnosis not present

## 2023-10-15 DIAGNOSIS — I10 Essential (primary) hypertension: Secondary | ICD-10-CM

## 2023-10-15 DIAGNOSIS — I255 Ischemic cardiomyopathy: Secondary | ICD-10-CM

## 2023-10-15 DIAGNOSIS — Z7984 Long term (current) use of oral hypoglycemic drugs: Secondary | ICD-10-CM | POA: Diagnosis not present

## 2023-10-15 DIAGNOSIS — L97421 Non-pressure chronic ulcer of left heel and midfoot limited to breakdown of skin: Secondary | ICD-10-CM | POA: Diagnosis not present

## 2023-10-15 DIAGNOSIS — E11621 Type 2 diabetes mellitus with foot ulcer: Secondary | ICD-10-CM

## 2023-10-15 LAB — POCT GLYCOSYLATED HEMOGLOBIN (HGB A1C): HbA1c, POC (controlled diabetic range): 14.1 % — AB (ref 0.0–7.0)

## 2023-10-15 MED ORDER — SPIRONOLACTONE 50 MG PO TABS
50.0000 mg | ORAL_TABLET | Freq: Every day | ORAL | 1 refills | Status: DC
  Filled 2023-11-10: qty 30, 30d supply, fill #0
  Filled 2023-12-08: qty 30, 30d supply, fill #1
  Filled 2024-01-06 – 2024-01-26 (×5): qty 30, 30d supply, fill #2
  Filled 2024-02-19: qty 30, 30d supply, fill #3
  Filled 2024-03-21: qty 60, 60d supply, fill #4

## 2023-10-15 MED ORDER — CARVEDILOL 6.25 MG PO TABS
6.2500 mg | ORAL_TABLET | Freq: Two times a day (BID) | ORAL | 1 refills | Status: DC
  Filled 2023-10-15: qty 180, 90d supply, fill #0
  Filled 2023-10-28: qty 60, 30d supply, fill #0
  Filled 2023-11-25: qty 60, 30d supply, fill #1
  Filled 2023-12-23 (×2): qty 60, 30d supply, fill #0
  Filled 2024-01-22: qty 60, 30d supply, fill #1
  Filled 2024-01-26: qty 60, 30d supply, fill #0
  Filled 2024-02-19: qty 60, 30d supply, fill #1
  Filled 2024-03-21: qty 60, 30d supply, fill #2

## 2023-10-15 MED ORDER — ENTRESTO 97-103 MG PO TABS
1.0000 | ORAL_TABLET | Freq: Two times a day (BID) | ORAL | 1 refills | Status: DC
  Filled 2023-10-15: qty 180, 90d supply, fill #0
  Filled 2023-11-03: qty 60, 30d supply, fill #0
  Filled 2023-12-08: qty 60, 30d supply, fill #1
  Filled 2023-12-19 – 2024-01-05 (×2): qty 60, 30d supply, fill #2
  Filled 2024-02-01: qty 60, 30d supply, fill #3
  Filled 2024-03-02 – 2024-03-03 (×2): qty 60, 30d supply, fill #4

## 2023-10-15 MED ORDER — BASAGLAR KWIKPEN 100 UNIT/ML ~~LOC~~ SOPN
65.0000 [IU] | PEN_INJECTOR | Freq: Every day | SUBCUTANEOUS | 6 refills | Status: DC
Start: 2023-10-15 — End: 2024-01-26
  Filled 2023-10-15 – 2023-12-10 (×4): qty 30, 46d supply, fill #0
  Filled 2024-01-20: qty 30, 46d supply, fill #1

## 2023-10-15 MED ORDER — FUROSEMIDE 40 MG PO TABS
40.0000 mg | ORAL_TABLET | Freq: Every day | ORAL | 1 refills | Status: DC
  Filled 2023-11-10: qty 30, 30d supply, fill #0
  Filled 2023-12-08: qty 30, 30d supply, fill #1
  Filled 2024-01-06 – 2024-01-26 (×5): qty 30, 30d supply, fill #2
  Filled 2024-02-19: qty 30, 30d supply, fill #3
  Filled 2024-03-21: qty 30, 30d supply, fill #4
  Filled 2024-04-19: qty 30, 30d supply, fill #5

## 2023-10-15 NOTE — Progress Notes (Signed)
Subjective:  Patient ID: [name], male    DOB: 1971/12/06  Age: 52 y.o. MRN: [id number]  CC: Establish Care   HPI [name] is a 52 y.o. 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   Interval History: Discussed the use of AI scribe software for clinical note transcription with the patient, who gave verbal consent to proceed.  The patient, with a history of diabetes and a chronic wound, reports that the wound has healed with the help of a cast and wound care clinic visits. He has been trying to balance walking and staying off the foot to aid in healing. He denies any hip pain associated with the altered gait due to the cast.  Regarding his diabetes, he checks his blood sugars at home about once a week, with readings usually in the 150-190 range. He reports that these are random checks and not fasting. He has been taking Lantus, which was increased from 50 to 60 units, but has not noticed any difference in his blood sugar levels. He also has a prescription for Novolog, which he is supposed to take three times a day, but admits to not taking it as prescribed due to work schedule and concerns about refrigeration. He reports a few instances where he felt faint, which resolved after eating a pack of crackers and drinking a Coke.  The patient also mentions an upcoming appointment with an endocrinologist for further diabetes management. He denies any cardiac symptoms and reports that he is still seeing a cardiologist. He also reports a need for an eye exam and agrees to a referral to an ophthalmologist.  Endorses adherence with his antihypertensives and his blood pressure is controlled. He is also doing well with his statin.       Past Medical History:  Diagnosis  Date   Acute systolic CHF (congestive heart failure) (HCC) 10/28/2016   CAD (coronary artery disease)    NSTEMI 10/2011: LHC 11/04/11: pLAD 90%, mLAD 60-70%, small D2 sub totally occluded at ostium, small OM1 90% ostial, 90% mid, mOM2 30%, oPL 80%, RCA 30%, dPDA 80%, EF 20% with ant AK.  PCI:  Promus DES to pLAD.   Cellulitis of left foot 06/22/2020   Chronic kidney disease    CKD   Chronic systolic heart failure (HCC)    Diabetic ulcer of left midfoot associated with type 2 diabetes mellitus, limited to breakdown of skin (HCC)    Diabetic ulcer of left midfoot associated with type 2 diabetes mellitus, with muscle involvement without evidence of necrosis (HCC)    DM2 (diabetes mellitus, type 2) (HCC)    type 2   GERD (gastroesophageal reflux disease)    HTN (hypertension)    Hyperlipidemia    Ischemic cardiomyopathy    Echo 11/03/11: mod LVH, mild focal basal septal hypertrophy, EF 15%, grade 2 diast dysfxn, mild MR, mild to mod LAE, mild RVE, mild to mod reduced RVSF.  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   Ulcer of right foot (HCC) 06/22/2020    Past Surgical History:  Procedure Laterality Date   AMPUTATION Left 06/23/2020  Procedure: AMPUTATION RAY 5th;  Surgeon: Vivi Barrack, DPM;  Location: [facility] OR;  Service: Podiatry;  Laterality: Left;   BONE BIOPSY Left 12/20/2020   Procedure: BONE BIOPSY X 3;  Surgeon: Park Liter, DPM;  Location: [facility] ORS;  Service: Podiatry;  Laterality: Left;   CARDIAC CATHETERIZATION N/A 10/25/2016   Procedure: Right/Left Heart Cath and Coronary Angiography;  Surgeon: Marykay Lex, MD;  Location: [facility] INVASIVE CV LAB;  Service: Cardiovascular;  Laterality: N/A;   CARDIAC CATHETERIZATION N/A 10/25/2016   Procedure: Coronary Stent Intervention;  Surgeon: Marykay Lex, MD;  Location: [facility] INVASIVE CV LAB;  Service:  Cardiovascular;  Laterality: N/A;   CARDIAC CATHETERIZATION N/A 10/28/2016   Procedure: Coronary Stent Intervention;  Surgeon: Kathleene Hazel, MD;  Location: [facility] INVASIVE CV LAB;  Service: Cardiovascular;  Laterality: N/A;   Feet surgery Bilateral    GRAFT APPLICATION Left 07/14/2020   Procedure: APPLICATION OF SKIN GRAFT USING [employer] BILAYER MATRIX WOUND DRESSING;  Surgeon: Park Liter, DPM;  Location: [facility] OR;  Service: Podiatry;  Laterality: Left;   I & D EXTREMITY Left 07/14/2020   Procedure: IRRIGATION AND DEBRIDEMENT LEFT FOOT;  Surgeon: Park Liter, DPM;  Location: [facility] OR;  Service: Podiatry;  Laterality: Left;   LEFT AND RIGHT HEART CATHETERIZATION WITH CORONARY ANGIOGRAM N/A 11/04/2011   Procedure: LEFT AND RIGHT HEART CATHETERIZATION WITH CORONARY ANGIOGRAM;  Surgeon: Rollene Rotunda, MD;  Location: [facility] CATH LAB;  Service: Cardiovascular;  Laterality: N/A;   METATARSAL HEAD EXCISION Left 01/24/2021   Procedure: 4th METATARSAL HEAD EXCISION, Proximal phalanx resection;  Surgeon: Park Liter, DPM;  Location: [facility] ORS;  Service: Podiatry;  Laterality: Left;   METATARSAL HEAD EXCISION Right 03/30/2021   Procedure: PROXIMAL 5TH TOE RESECTION ON RIGHT;  Surgeon: Park Liter, DPM;  Location: [facility] ORS;  Service: Podiatry;  Laterality: Right;   PERCUTANEOUS CORONARY STENT INTERVENTION (PCI-S)  11/04/2011   Procedure: PERCUTANEOUS CORONARY STENT INTERVENTION (PCI-S);  Surgeon: Tonny Bollman, MD;  Location: [facility] CATH LAB;  Service: Cardiovascular;;   WOUND DEBRIDEMENT Left 06/26/2020   Procedure: LEFT FOOT WOUND DEBRIDEMENT AND GRAFT APPLICATION;  Surgeon: Vivi Barrack, DPM;  Location: [facility] OR;  Service: Podiatry;  Laterality: Left;   WOUND DEBRIDEMENT Left 12/20/2020   Procedure: DEBRIDEMENT WOUND AND POSSIBLE CLOSURE;  Surgeon: Park Liter, DPM;  Location: [facility] ORS;  Service: Podiatry;  Laterality: Left;   WOUND DEBRIDEMENT Left 01/24/2021   Procedure: DEBRIDEMENT  WOUND;  Surgeon: Park Liter, DPM;  Location: [facility] ORS;  Service: Podiatry;  Laterality: Left;   WOUND DEBRIDEMENT Bilateral 03/30/2021   Procedure: DEBRIDEMENT WOUND WITH SKIN GRAFT SUBSTITUTE APPLICATION;  Surgeon: Park Liter, DPM;  Location: [facility] ORS;  Service: Podiatry;  Laterality: Bilateral;   WOUND DEBRIDEMENT Right 04/20/2021   Procedure: DEBRIDEMENT Valrie Hart FOOT;  Surgeon: Park Liter, DPM;  Location: [facility] ORS;  Service: Podiatry;  Laterality: Right;    Family History  Problem Relation Age of Onset   Heart disease Father    Heart disease Mother        MOTHER HAD CABG    Social History   Socioeconomic History   Marital status: Single    Spouse name: Not on file   Number of children: Not on file   Years of education: Not on file   Highest education level: Not on file  Occupational History   Not on file  Tobacco Use   Smoking status: Never   Smokeless tobacco: Never  Vaping Use   Vaping status: Never Used  Substance and Sexual Activity   Alcohol use: Not Currently    Alcohol/week: 1.0 standard drink of alcohol    Types: 1 Shots of liquor per week   Drug use: No   Sexual activity: Yes    Birth control/protection: None  Other Topics Concern   Not on file  Social History Narrative   Not on file   Social Determinants of Health   Financial Resource Strain: Low Risk  (05/26/2020)   Overall Financial Resource Strain (CARDIA)    Difficulty of Paying Living Expenses: Not very hard  Food Insecurity: No Food Insecurity (05/26/2020)   Hunger Vital Sign    Worried About Running Out of Food in the Last Year: Never true    Ran Out of Food in the Last Year: Never true  Transportation Needs: No Transportation Needs (05/26/2020)   PRAPARE - Administrator, Civil Service (Medical): No    Lack of Transportation (Non-Medical): No  Physical Activity: Not on file  Stress: Not on file  Social Connections: Not on file    Allergies  Allergen Reactions    Ozempic (0.25 Or 0.5 Mg-Dose) [Semaglutide(0.25 Or 0.5mg -Dos)] Other (See Comments)    Severe abdominal pain   Jardiance [Empagliflozin] Rash    Outpatient Medications Prior to Visit  Medication Sig Dispense Refill   acetaminophen (TYLENOL) 500 MG tablet Take 500 mg by mouth every 6 (six) hours as needed.     aspirin 81 MG chewable tablet Chew 1 tablet (81 mg total) by mouth daily. 30 tablet 6   atorvastatin (LIPITOR) 80 MG tablet Take 1 tablet (80 mg total) by mouth every evening. 30 tablet 1   Blood Glucose Monitoring Suppl (ONETOUCH VERIO) w/Device KIT Use to check blood sugar 3 times daily. 1 kit 0   dapagliflozin propanediol (FARXIGA) 10 MG TABS tablet Take 1 tablet (10 mg total) by mouth daily. 30 tablet 2   Evolocumab (REPATHA SURECLICK) 140 MG/ML SOAJ Inject 140 mg into the skin every 14 (fourteen) days. 2 mL 11   glucose blood test strip Use to test blood sugar 3 times a day 100 each 0   insulin lispro (HUMALOG KWIKPEN) 100 UNIT/ML KwikPen Inject 10 Units into the skin 3 (three) times daily before meals. Hold if blood sugar is less than 150 15 mL 11   omeprazole (PRILOSEC) 20 MG capsule Take 1 capsule (20 mg total) by mouth daily. 90 capsule 1   carvedilol (COREG) 6.25 MG tablet Take 1 tablet (6.25 mg total) by mouth 2 (two) times daily with a meal. 60 tablet 0   furosemide (LASIX) 40 MG tablet Take 1 tablet (40 mg total) by mouth daily. 90 tablet 1   Insulin Glargine (BASAGLAR KWIKPEN) 100 UNIT/ML Inject 60 Units into the skin daily. 30 mL 6   sacubitril-valsartan (ENTRESTO) 97-103 MG Take 1 tablet by mouth 2 (two) times daily. 60 tablet 0   spironolactone (ALDACTONE) 50 MG tablet Take 1 tablet (50 mg total) by mouth daily. 90 tablet 3   amoxicillin-clavulanate (AUGMENTIN) 875-125 MG tablet Take 1 tablet by mouth 2 (two) times daily for 7 days. (Patient not taking: Reported on 10/15/2023) 14 tablet 0   levofloxacin (LEVAQUIN) 750 MG tablet Take 1 tablet (750 mg total) by mouth daily  for 7 days (Patient not taking: Reported on 05/05/2023) 7 tablet 0   Facility-Administered Medications Prior to Visit  Medication Dose Route Frequency Provider Last Rate Last Admin  vancomycin (VANCOCIN) powder    PRN Park Liter, DPM   1,000 mg at 01/24/21 1411     ROS Review of Systems  Constitutional:  Negative for activity change and appetite change.  HENT:  Negative for sinus pressure and sore throat.   Respiratory:  Negative for chest tightness, shortness of breath and wheezing.   Cardiovascular:  Negative for chest pain and palpitations.  Gastrointestinal:  Negative for abdominal distention, abdominal pain and constipation.  Genitourinary: Negative.   Musculoskeletal: Negative.   Skin:  Positive for wound.  Psychiatric/Behavioral:  Negative for behavioral problems and dysphoric mood.     Objective:  BP 104/73   Pulse 85   Ht 6\' 6"  (1.981 m)   Wt 295 lb 3.2 oz (133.9 kg)   SpO2 97%   BMI 34.11 kg/m      10/15/2023    3:26 PM 05/05/2023    8:47 AM 11/28/2022   10:19 AM  BP/Weight  Systolic BP 104 116 118  Diastolic BP 73 82 83  Wt. (Lbs) 295.2 284.8 278.6  BMI 34.11 kg/m2 32.91 kg/m2 32.2 kg/m2      Physical Exam Constitutional:      Appearance: He is well-developed.  Cardiovascular:     Rate and Rhythm: Normal rate.     Heart sounds: Normal heart sounds. No murmur heard. Pulmonary:     Effort: Pulmonary effort is normal.     Breath sounds: Normal breath sounds. No wheezing or rales.  Chest:     Chest wall: No tenderness.  Abdominal:     General: Bowel sounds are normal. There is no distension.     Palpations: Abdomen is soft. There is no mass.     Tenderness: There is no abdominal tenderness.  Musculoskeletal:        General: Normal range of motion.     Right lower leg: No edema.     Left lower leg: No edema.     Comments: Left leg in a boot  Skin:    Findings: No erythema.  Neurological:     Mental Status: He is alert and oriented to person,  place, and time.  Psychiatric:        Mood and Affect: Mood normal.        Latest Ref Rng & Units 11/28/2022   11:33 AM 08/27/2022    3:37 PM 09/14/2021    9:50 AM  CMP  Glucose 70 - 99 mg/dL 161  096  045   BUN 6 - 24 mg/dL 25  31  24    Creatinine 0.76 - 1.27 mg/dL 4.09  8.11  9.14   Sodium 134 - 144 mmol/L 139  137  138   Potassium 3.5 - 5.2 mmol/L 4.8  5.3  4.2   Chloride 96 - 106 mmol/L 99  98  105   CO2 20 - 29 mmol/L 24  22  25    Calcium 8.7 - 10.2 mg/dL 78.2  9.6  9.3   Total Protein 6.0 - 8.5 g/dL  7.4    Total Bilirubin 0.0 - 1.2 mg/dL  0.5    Alkaline Phos 44 - 121 IU/L  77    AST 0 - 40 IU/L  16    ALT 0 - 44 IU/L  22      Lipid Panel     Component Value Date/Time   CHOL 184 08/27/2022 1537   TRIG 169 (H) 08/27/2022 1537   HDL 42 08/27/2022 1537   CHOLHDL 4.4 08/27/2022  1537   CHOLHDL 2.7 09/14/2021 0950   VLDL 19 09/14/2021 0950   LDLCALC 112 (H) 08/27/2022 1537   LDLDIRECT 117.0 06/14/2013 0826    CBC    Component Value Date/Time   WBC 9.0 08/27/2022 1537   WBC 11.1 (H) 04/20/2021 1550   RBC 5.22 08/27/2022 1537   RBC 4.67 04/20/2021 1550   HGB 14.5 08/27/2022 1537   HCT 44.2 08/27/2022 1537   PLT 223 08/27/2022 1537   MCV 85 08/27/2022 1537   MCH 27.8 08/27/2022 1537   MCH 25.9 (L) 04/20/2021 1550   MCHC 32.8 08/27/2022 1537   MCHC 32.2 04/20/2021 1550   RDW 12.2 08/27/2022 1537   LYMPHSABS 2.8 08/27/2022 1537   MONOABS 0.8 06/27/2020 0354   EOSABS 0.2 08/27/2022 1537   BASOSABS 0.1 08/27/2022 1537    Lab Results  Component Value Date   HGBA1C 14.1 (A) 10/15/2023    Assessment & Plan:      Type II Diabetes Mellitus Poorly controlled with A1c of 14.1. Patient admits to inconsistent use of Novolog due to work schedule. Lantus was previously increased from 50 to 60 units with no significant improvement in blood glucose levels. -Increase Lantus to 65 units daily. -Encourage consistent use of Novolog 10 units TID with meals, including at  work. -Order blood work to monitor diabetes control. -Referral to endocrinologist for further management.  Chronic Wound Healing well with wound care and use of cast. Patient is compliant with care instructions and is limiting weight-bearing activities to promote healing. -Continue current wound care regimen. -Encourage patient to continue limiting weight-bearing activities.  Hypertension Well controlled on current regimen -Continue current management.  Ischemic Cardiomyopathy EF 40-45% from 08/2021, grade 1DD Status post DES x 2 Currently on SGLT2 inhibitor, Entresto, beta-blocker, spironolactone, statin He will need an echo at his next visit Continue to follow-up with cardiology   General Health Maintenance -Referral for eye exam with ophthalmologist due to diabetes.          Meds ordered this encounter  Medications   Insulin Glargine (BASAGLAR KWIKPEN) 100 UNIT/ML    Sig: Inject 65 Units into the skin daily.    Dispense:  30 mL    Refill:  6    Dose increase   carvedilol (COREG) 6.25 MG tablet    Sig: Take 1 tablet (6.25 mg total) by mouth 2 (two) times daily with a meal.    Dispense:  180 tablet    Refill:  1   furosemide (LASIX) 40 MG tablet    Sig: Take 1 tablet (40 mg total) by mouth daily.    Dispense:  90 tablet    Refill:  1   sacubitril-valsartan (ENTRESTO) 97-103 MG    Sig: Take 1 tablet by mouth 2 (two) times daily.    Dispense:  180 tablet    Refill:  1   spironolactone (ALDACTONE) 50 MG tablet    Sig: Take 1 tablet (50 mg total) by mouth daily.    Dispense:  90 tablet    Refill:  1    Follow-up: Return in about 3 months (around 01/15/2024) for Chronic medical conditions.       Hoy Register, MD, FAAFP. [facility] [place], [place] [phone]   10/15/2023, 5:19 PM

## 2023-10-15 NOTE — Patient Instructions (Signed)
Diabetes Mellitus Action Plan Following a diabetes action plan is a way for you to manage your diabetes (diabetes mellitus) symptoms. The plan is color-coded to help you understand what actions you need to take based on any symptoms you are having. If you have symptoms in the red zone, you need medical care right away. If you have symptoms in the yellow zone, you are having problems. If you have symptoms in the green zone, you are doing well. Learning about and understanding diabetes can take time. Follow the plan that you develop with your health care provider. Know the target range for your blood sugar (glucose) level, and review your treatment plan with your health care provider at each visit. The target range for my blood sugar level is __________________________ mg/dL. Red zone Get medical help right away if you have any of the following symptoms: A blood sugar test result that is below 54 mg/dL (3 mmol/L). A blood sugar test result that is at or above 240 mg/dL (16.1 mmol/L) for 2 days in a row. Confusion or trouble thinking clearly. Difficulty breathing. Sickness or a fever for 2 or more days that is not getting better. Moderate or large ketone levels in your urine. Feeling tired or having no energy. If you have any red zone symptoms, do not wait to see if the symptoms will go away. Get medical help right away. Call your local emergency services (911 in the [place] Do not drive yourself to the hospital. If you have severely low blood sugar (severe hypoglycemia) and you cannot eat or drink, you may need glucagon. Make sure a family member or close friend knows how to check your blood sugar and how to give you glucagon. You may need to be treated in a hospital for this condition. Yellow zone If you have any of the following symptoms, your diabetes is not under control and you may need to make some changes: A blood sugar test result that is at or above 240 mg/dL (09.6 mmol/L) for 2 days in a  row. Blood sugar test results that are below 70 mg/dL (3.9 mmol/L). Other symptoms of hypoglycemia, such as: Shaking or feeling light-headed. Confusion or irritability. Feeling hungry. Having a fast heartbeat. If you have any yellow zone symptoms: Treat your hypoglycemia by eating or drinking 15 grams of a rapid-acting carbohydrate. Follow the 15:15 rule: Take 15 grams of a rapid-acting carbohydrate, such as: 1 tube of glucose gel. 4 glucose pills. 4 oz (120 mL) of fruit juice. 4 oz (120 mL) of regular (not diet) soda. Check your blood sugar 15 minutes after you take the carbohydrate. If the repeat blood sugar test is still at or below 70 mg/dL (3.9 mmol/L), take 15 grams of a carbohydrate again. If your blood sugar does not increase above 70 mg/dL (3.9 mmol/L) after 3 tries, get medical help right away. After your blood sugar returns to normal, eat a meal or a snack within 1 hour. Keep taking your daily medicines as told by your health care provider. Check your blood sugar more often than you normally would. Write down your results. Call your health care provider if you have trouble keeping your blood sugar in your target range.  Green zone These signs mean you are doing well and you can continue what you are doing to manage your diabetes: Your blood sugar is within your personal target range. For most people, a blood sugar level before a meal (preprandial) should be 80-130 mg/dL (0.4-5.4 mmol/L). You feel  well, and you are able to do daily activities. If you are in the green zone, continue to manage your diabetes as told by your health care provider. To do this: Eat a healthy diet. Exercise regularly. Check your blood sugar as told by your health care provider. Take your medicines as told by your health care provider.  Where to find more information American Diabetes Association (ADA): diabetes.org Association of Diabetes Care & Education Specialists (ADCES):  diabeteseducator.org Summary Following a diabetes action plan is a way for you to manage your diabetes symptoms. The plan is color-coded to help you understand what actions you need to take based on any symptoms you are having. Follow the plan that you develop with your health care provider. Make sure you know your personal target blood sugar level. Review your treatment plan with your health care provider at each visit. This information is not intended to replace advice given to you by your health care provider. Make sure you discuss any questions you have with your health care provider. Document Revised: 06/22/2020 Document Reviewed: 06/22/2020 Elsevier Patient Education  2024 [employer].

## 2023-10-16 LAB — CMP14+EGFR
ALT: 17 [IU]/L (ref 0–44)
AST: 12 [IU]/L (ref 0–40)
Albumin: 4.3 g/dL (ref 4.1–5.1)
Alkaline Phosphatase: 82 [IU]/L (ref 44–121)
BUN/Creatinine Ratio: 24 — ABNORMAL HIGH (ref 9–20)
BUN: 37 mg/dL — ABNORMAL HIGH (ref 6–24)
Bilirubin Total: 0.4 mg/dL (ref 0.0–1.2)
CO2: 21 mmol/L (ref 20–29)
Calcium: 9.7 mg/dL (ref 8.7–10.2)
Chloride: 100 mmol/L (ref 96–106)
Creatinine, Ser: 1.54 mg/dL — ABNORMAL HIGH (ref 0.76–1.27)
Globulin, Total: 3.1 g/dL (ref 1.5–4.5)
Glucose: 289 mg/dL — ABNORMAL HIGH (ref 70–99)
Potassium: 4 mmol/L (ref 3.5–5.2)
Sodium: 138 mmol/L (ref 134–144)
Total Protein: 7.4 g/dL (ref 6.0–8.5)
eGFR: 55 mL/min/{1.73_m2} — ABNORMAL LOW (ref >59)

## 2023-10-21 ENCOUNTER — Encounter (HOSPITAL_BASED_OUTPATIENT_CLINIC_OR_DEPARTMENT_OTHER): Payer: 59 | Admitting: General Surgery

## 2023-10-21 DIAGNOSIS — L97522 Non-pressure chronic ulcer of other part of left foot with fat layer exposed: Secondary | ICD-10-CM | POA: Diagnosis not present

## 2023-10-21 DIAGNOSIS — Z794 Long term (current) use of insulin: Secondary | ICD-10-CM | POA: Diagnosis not present

## 2023-10-21 DIAGNOSIS — E11621 Type 2 diabetes mellitus with foot ulcer: Secondary | ICD-10-CM | POA: Diagnosis not present

## 2023-10-21 DIAGNOSIS — Z7984 Long term (current) use of oral hypoglycemic drugs: Secondary | ICD-10-CM | POA: Diagnosis not present

## 2023-10-21 DIAGNOSIS — I2510 Atherosclerotic heart disease of native coronary artery without angina pectoris: Secondary | ICD-10-CM | POA: Diagnosis not present

## 2023-10-21 DIAGNOSIS — I130 Hypertensive heart and chronic kidney disease with heart failure and stage 1 through stage 4 chronic kidney disease, or unspecified chronic kidney disease: Secondary | ICD-10-CM | POA: Diagnosis not present

## 2023-10-21 DIAGNOSIS — N189 Chronic kidney disease, unspecified: Secondary | ICD-10-CM | POA: Diagnosis not present

## 2023-10-21 DIAGNOSIS — I5042 Chronic combined systolic (congestive) and diastolic (congestive) heart failure: Secondary | ICD-10-CM | POA: Diagnosis not present

## 2023-10-21 DIAGNOSIS — Z89422 Acquired absence of other left toe(s): Secondary | ICD-10-CM | POA: Diagnosis not present

## 2023-10-21 DIAGNOSIS — E1140 Type 2 diabetes mellitus with diabetic neuropathy, unspecified: Secondary | ICD-10-CM | POA: Diagnosis not present

## 2023-10-21 DIAGNOSIS — I252 Old myocardial infarction: Secondary | ICD-10-CM | POA: Diagnosis not present

## 2023-10-21 DIAGNOSIS — E1122 Type 2 diabetes mellitus with diabetic chronic kidney disease: Secondary | ICD-10-CM | POA: Diagnosis not present

## 2023-10-21 DIAGNOSIS — E785 Hyperlipidemia, unspecified: Secondary | ICD-10-CM | POA: Diagnosis not present

## 2023-10-21 DIAGNOSIS — Z955 Presence of coronary angioplasty implant and graft: Secondary | ICD-10-CM | POA: Diagnosis not present

## 2023-10-21 NOTE — Progress Notes (Signed)
KAMAEHU, OSMANSKI ([id number]) [id number] Page 1 of 8 Visit Report for 10/21/2023 Arrival Information Details Patient Name: Date of Service: [name] [place], [name] RO LD 10/21/2023 7:45 A M Medical Record Number: [id number] Patient Account Number: [id number] Date of Birth/Sex: Treating RN: [birth year] (52 y.o. [name] Primary Care [name]: [name] Other Clinician: Referring [name]: Treating [name]/Extender: [name] in Treatment: 38 Visit Information History Since Last Visit Added or deleted any medications: No Patient Arrived: Ambulatory Any new allergies or adverse reactions: No Arrival Time: 07:38 Had a fall or experienced change in No Accompanied By: self activities of daily living that may affect Transfer Assistance: None risk of falls: Patient Identification Verified: Yes Signs or symptoms of abuse/neglect since last visito No Secondary Verification Process Completed: Yes Hospitalized since last visit: No Patient Requires Transmission-Based Precautions: No Implantable device outside of the clinic excluding No Patient Has Alerts: Yes cellular tissue based products placed in the center Patient Alerts: 08/2021 ABI:1.2 TBI 0.79 since last visit: Has Dressing in Place as Prescribed: Yes Has Footwear/Offloading in Place as Prescribed: Yes Left: T Contact Cast otal Pain Present Now: No Electronic Signature(s) Signed: 10/21/2023 3:45:49 PM By: Samuella [name] Entered By: Samuella [name] on 10/21/2023 04:40:42 -------------------------------------------------------------------------------- Encounter Discharge Information Details Patient Name: Date of Service: [name], [name] 10/21/2023 7:45 A M Medical Record Number: [id number] Patient Account Number: [id number] Date of Birth/Sex: Treating RN: January 22, 1971 (52 y.o. [name] Primary Care [name] [name]: [name] Other  Clinician: Referring [name]: Treating [name]/Extender: [name] in Treatment: 5 Encounter Discharge Information Items Discharge Condition: Stable Ambulatory Status: Ambulatory Discharge Destination: Home Transportation: Private Auto Accompanied By: self Schedule Follow-up Appointment: Yes Clinical Summary of Care: Patient Declined Electronic Signature(s) Signed: 10/21/2023 3:45:49 PM By: Samuella [name] Entered By: Samuella [name] on 10/21/2023 04:57:25 [name] ([id number]) [id number] Page 2 of 8 -------------------------------------------------------------------------------- Lower Extremity Assessment Details Patient Name: Date of Service: [name] [place], [name] RO LD 10/21/2023 7:45 A M Medical Record Number: [id number] Patient Account Number: [id number] Date of Birth/Sex: Treating RN: 09-08-1971 (52 y.o. [name] Primary Care [name]: [name] Other Clinician: Referring Maximus Hoffert: Treating [name]/Extender: [name] in Treatment: 38 Edema Assessment Assessed: [name]: No] [name]: No] Edema: [Left: N] [Right: o] Calf Left: Right: Point of Measurement: 43 cm From Medial Instep 38 cm Ankle Left: Right: Point of Measurement: 9 cm From Medial Instep 22 cm Vascular Assessment Pulses: Dorsalis Pedis Palpable: [Left:Yes] Extremity colors, hair growth, and conditions: Extremity Color: [Left:Normal] Hair Growth on Extremity: [Left:Yes] Temperature of Extremity: [Left:Warm] Capillary Refill: [Left:< 3 seconds] Dependent Rubor: [Left:No No] Electronic Signature(s) Signed: 10/21/2023 3:45:49 PM By: Samuella [name] Entered By: Samuella [name] on 10/21/2023 04:47:05 -------------------------------------------------------------------------------- Multi Wound Chart Details Patient Name: Date of Service: [name], [name] 10/21/2023 7:45 A M Medical  Record Number: [id number] Patient Account Number: [id number] Date of Birth/Sex: Treating RN: [birth year] (52 y.o. M) Primary Care [name]: [name] Other Clinician: Referring [name]: Treating [name]/Extender: [name] in Treatment: 38 Vital Signs Height(in): 78 Pulse(bpm): 84 Weight(lbs): 275 Blood Pressure(mmHg): 110/77 Body Mass Index(BMI): 31.8 Temperature(F): 98.1 Respiratory Rate(breaths/min): 16 [3:Photos:] [N/A:N/A] Left, Plantar Foot N/A N/A Wound Location: Gradually Appeared N/A N/A Wounding Event: Diabetic Wound/Ulcer of the Lower N/A N/A Primary Etiology: Extremity Chronic sinus problems/congestion, N/A N/A Comorbid History: Congestive Heart Failure, Coronary Artery Disease, Hypertension, Myocardial Infarction, Type II Diabetes, Neuropathy 10/01/2022 N/A N/A Date Acquired: 40 N/A  [name] on 10/21/2023 04:56:47 -------------------------------------------------------------------------------- Pain Assessment Details Patient Name: Date of Service: [name] [place], [name] RO LD 10/21/2023 7:45 A M Medical Record Number: [id number] Patient Account Number: [id number] Date of Birth/Sex: Treating RN: [birth year] (52 y.o. [name] Primary Care Chayanne Speir: [name] Other Clinician: Referring Tanyiah Laurich: Treating [name]/Extender: [name] in Treatment:  38 Active Problems Location of Pain Severity and Description of Pain Patient Has Paino No Site Locations Rate the pain. Current Pain Level: 0 Pain Management and Medication Current Pain Management: Electronic Signature(s) Signed: 10/21/2023 3:45:49 PM By: Samuella [name] Entered By: Samuella [name] on 10/21/2023 04:40:49 -------------------------------------------------------------------------------- Patient/Caregiver Education Details Patient Name: Date of Service: [name], [name] 10/22/2024andnbsp7:45 A M Medical Record Number: [id number] Patient Account Number: [id number] Date of Birth/Gender: Treating RN: May 23, 1971 (52 y.o. [name] Primary Care Physician: [name] Other Clinician: Referring Physician: Treating Physician/Extender: [name] in Treatment: 62 Education Assessment Education Provided To: Patient [name], [name] ([id number]) [id number] Page 6 of 8 Education Topics Provided Wound/Skin Impairment: Methods: Explain/Verbal Responses: Reinforcements needed, State content correctly Electronic Signature(s) Signed: 10/21/2023 3:45:49 PM By: Samuella [name] Entered By: Samuella [name] on 10/21/2023 04:56:57 -------------------------------------------------------------------------------- Wound Assessment Details Patient Name: Date of Service: [name] [place], [name] 10/21/2023 7:45 A M Medical Record Number: [id number] Patient Account Number: [id number] Date of Birth/Sex: Treating RN: 11-Feb-1971 (51 y.o. [name] Primary Care Andera Cranmer: [name] Other Clinician: Referring Dontavius Keim: Treating Scout Gumbs/Extender: [name] in Treatment: 38 Wound Status Wound Number: 3 Primary Diabetic Wound/Ulcer of the Lower Extremity Etiology: Wound Location: Left, Plantar Foot Wound Open Wounding Event: Gradually Appeared Status: Date  Acquired: 10/01/2022 Comorbid Chronic sinus problems/congestion, Congestive Heart Failure, Weeks Of Treatment: 38 History: Coronary Artery Disease, Hypertension, Myocardial Infarction, Type Clustered Wound: No II Diabetes, Neuropathy Pending Amputation On Presentation Wound under treatment by Zebulen Simonis outside of Wound Center Photos Wound Measurements Length: (cm) 0.5 Width: (cm) 0.4 Depth: (cm) 0.1 Area: (cm) 0.157 Volume: (cm) 0.016 % Reduction in Area: 96% % Reduction in Volume: 98% Epithelialization: Medium (34-66%) Tunneling: No Undermining: No Wound Description Classification: Grade 1 Wound Margin: Thickened Exudate Amount: Medium Exudate Type: Serosanguineous Exudate Color: red, brown Foul Odor After Cleansing: No Slough/Fibrino Yes Wound Bed Granulation Amount: Large (67-100%) Exposed Structure Granulation Quality: Red, Pink Fascia Exposed: No Necrotic Amount: Small (1-33%) Fat Layer (Subcutaneous Tissue) Exposed: Yes Necrotic Quality: Adherent Slough Tendon Exposed: No Muscle Exposed: No Joint Exposed: No Bone Exposed: No DENZELLE, FARD ([id number]) [id number] Page 7 of 8 Periwound Skin Texture Texture Color No Abnormalities Noted: No No Abnormalities Noted: Yes Callus: Yes Temperature / Pain Crepitus: No Temperature: No Abnormality Excoriation: No Induration: No Rash: No Scarring: No Moisture No Abnormalities Noted: No Dry / Scaly: No Maceration: Yes Treatment Notes Wound #3 (Foot) Wound Laterality: Plantar, Left Cleanser Soap and Water Discharge Instruction: May shower and wash wound with dial antibacterial soap and water prior to dressing change. Vashe 5.8 (oz) Discharge Instruction: Cleanse the wound with Vashe prior to applying a clean dressing using gauze sponges, not tissue or cotton balls. Peri-Wound Care Zinc Oxide Ointment 30g tube Discharge Instruction: Apply Zinc Oxide to periwound with each dressing  change Topical Gentamicin Discharge Instruction: As directed by physician Mupirocin Ointment Discharge Instruction: Apply Mupirocin (Bactroban) as instructed Skintegrity Hydrogel 4 (oz) Discharge Instruction: Apply hydrogel as directed Primary Dressing Promogran Prisma Matrix, 4.34 (sq in) (silver collagen) Discharge Instruction: Moisten collagen with saline or hydrogel  [name] on 10/21/2023 04:56:47 -------------------------------------------------------------------------------- Pain Assessment Details Patient Name: Date of Service: [name] [place], [name] RO LD 10/21/2023 7:45 A M Medical Record Number: [id number] Patient Account Number: [id number] Date of Birth/Sex: Treating RN: [birth year] (52 y.o. [name] Primary Care Chayanne Speir: [name] Other Clinician: Referring Tanyiah Laurich: Treating [name]/Extender: [name] in Treatment:  38 Active Problems Location of Pain Severity and Description of Pain Patient Has Paino No Site Locations Rate the pain. Current Pain Level: 0 Pain Management and Medication Current Pain Management: Electronic Signature(s) Signed: 10/21/2023 3:45:49 PM By: Samuella [name] Entered By: Samuella [name] on 10/21/2023 04:40:49 -------------------------------------------------------------------------------- Patient/Caregiver Education Details Patient Name: Date of Service: [name], [name] 10/22/2024andnbsp7:45 A M Medical Record Number: [id number] Patient Account Number: [id number] Date of Birth/Gender: Treating RN: May 23, 1971 (52 y.o. [name] Primary Care Physician: [name] Other Clinician: Referring Physician: Treating Physician/Extender: [name] in Treatment: 62 Education Assessment Education Provided To: Patient [name], [name] ([id number]) [id number] Page 6 of 8 Education Topics Provided Wound/Skin Impairment: Methods: Explain/Verbal Responses: Reinforcements needed, State content correctly Electronic Signature(s) Signed: 10/21/2023 3:45:49 PM By: Samuella [name] Entered By: Samuella [name] on 10/21/2023 04:56:57 -------------------------------------------------------------------------------- Wound Assessment Details Patient Name: Date of Service: [name], [name] 10/21/2023 7:45 A M Medical Record Number: [id number] Patient Account Number: [id number] Date of Birth/Sex: Treating RN: 11-Feb-1971 (51 y.o. [name] Primary Care Andera Cranmer: [name] Other Clinician: Referring Dontavius Keim: Treating Scout Gumbs/Extender: [name] in Treatment: 38 Wound Status Wound Number: 3 Primary Diabetic Wound/Ulcer of the Lower Extremity Etiology: Wound Location: Left, Plantar Foot Wound Open Wounding Event: Gradually Appeared Status: Date  Acquired: 10/01/2022 Comorbid Chronic sinus problems/congestion, Congestive Heart Failure, Weeks Of Treatment: 38 History: Coronary Artery Disease, Hypertension, Myocardial Infarction, Type Clustered Wound: No II Diabetes, Neuropathy Pending Amputation On Presentation Wound under treatment by Zebulen Simonis outside of Wound Center Photos Wound Measurements Length: (cm) 0.5 Width: (cm) 0.4 Depth: (cm) 0.1 Area: (cm) 0.157 Volume: (cm) 0.016 % Reduction in Area: 96% % Reduction in Volume: 98% Epithelialization: Medium (34-66%) Tunneling: No Undermining: No Wound Description Classification: Grade 1 Wound Margin: Thickened Exudate Amount: Medium Exudate Type: Serosanguineous Exudate Color: red, brown Foul Odor After Cleansing: No Slough/Fibrino Yes Wound Bed Granulation Amount: Large (67-100%) Exposed Structure Granulation Quality: Red, Pink Fascia Exposed: No Necrotic Amount: Small (1-33%) Fat Layer (Subcutaneous Tissue) Exposed: Yes Necrotic Quality: Adherent Slough Tendon Exposed: No Muscle Exposed: No Joint Exposed: No Bone Exposed: No DENZELLE, FARD ([id number]) [id number] Page 7 of 8 Periwound Skin Texture Texture Color No Abnormalities Noted: No No Abnormalities Noted: Yes Callus: Yes Temperature / Pain Crepitus: No Temperature: No Abnormality Excoriation: No Induration: No Rash: No Scarring: No Moisture No Abnormalities Noted: No Dry / Scaly: No Maceration: Yes Treatment Notes Wound #3 (Foot) Wound Laterality: Plantar, Left Cleanser Soap and Water Discharge Instruction: May shower and wash wound with dial antibacterial soap and water prior to dressing change. Vashe 5.8 (oz) Discharge Instruction: Cleanse the wound with Vashe prior to applying a clean dressing using gauze sponges, not tissue or cotton balls. Peri-Wound Care Zinc Oxide Ointment 30g tube Discharge Instruction: Apply Zinc Oxide to periwound with each dressing  change Topical Gentamicin Discharge Instruction: As directed by physician Mupirocin Ointment Discharge Instruction: Apply Mupirocin (Bactroban) as instructed Skintegrity Hydrogel 4 (oz) Discharge Instruction: Apply hydrogel as directed Primary Dressing Promogran Prisma Matrix, 4.34 (sq in) (silver collagen) Discharge Instruction: Moisten collagen with saline or hydrogel  [name] on 10/21/2023 04:56:47 -------------------------------------------------------------------------------- Pain Assessment Details Patient Name: Date of Service: [name] [place], [name] RO LD 10/21/2023 7:45 A M Medical Record Number: [id number] Patient Account Number: [id number] Date of Birth/Sex: Treating RN: [birth year] (52 y.o. [name] Primary Care Chayanne Speir: [name] Other Clinician: Referring Tanyiah Laurich: Treating [name]/Extender: [name] in Treatment:  38 Active Problems Location of Pain Severity and Description of Pain Patient Has Paino No Site Locations Rate the pain. Current Pain Level: 0 Pain Management and Medication Current Pain Management: Electronic Signature(s) Signed: 10/21/2023 3:45:49 PM By: Samuella [name] Entered By: Samuella [name] on 10/21/2023 04:40:49 -------------------------------------------------------------------------------- Patient/Caregiver Education Details Patient Name: Date of Service: [name], [name] 10/22/2024andnbsp7:45 A M Medical Record Number: [id number] Patient Account Number: [id number] Date of Birth/Gender: Treating RN: May 23, 1971 (52 y.o. [name] Primary Care Physician: [name] Other Clinician: Referring Physician: Treating Physician/Extender: [name] in Treatment: 62 Education Assessment Education Provided To: Patient [name], [name] ([id number]) [id number] Page 6 of 8 Education Topics Provided Wound/Skin Impairment: Methods: Explain/Verbal Responses: Reinforcements needed, State content correctly Electronic Signature(s) Signed: 10/21/2023 3:45:49 PM By: Samuella [name] Entered By: Samuella [name] on 10/21/2023 04:56:57 -------------------------------------------------------------------------------- Wound Assessment Details Patient Name: Date of Service: [name], [name] 10/21/2023 7:45 A M Medical Record Number: [id number] Patient Account Number: [id number] Date of Birth/Sex: Treating RN: 11-Feb-1971 (51 y.o. [name] Primary Care Andera Cranmer: [name] Other Clinician: Referring Dontavius Keim: Treating Scout Gumbs/Extender: [name] in Treatment: 38 Wound Status Wound Number: 3 Primary Diabetic Wound/Ulcer of the Lower Extremity Etiology: Wound Location: Left, Plantar Foot Wound Open Wounding Event: Gradually Appeared Status: Date  Acquired: 10/01/2022 Comorbid Chronic sinus problems/congestion, Congestive Heart Failure, Weeks Of Treatment: 38 History: Coronary Artery Disease, Hypertension, Myocardial Infarction, Type Clustered Wound: No II Diabetes, Neuropathy Pending Amputation On Presentation Wound under treatment by Zebulen Simonis outside of Wound Center Photos Wound Measurements Length: (cm) 0.5 Width: (cm) 0.4 Depth: (cm) 0.1 Area: (cm) 0.157 Volume: (cm) 0.016 % Reduction in Area: 96% % Reduction in Volume: 98% Epithelialization: Medium (34-66%) Tunneling: No Undermining: No Wound Description Classification: Grade 1 Wound Margin: Thickened Exudate Amount: Medium Exudate Type: Serosanguineous Exudate Color: red, brown Foul Odor After Cleansing: No Slough/Fibrino Yes Wound Bed Granulation Amount: Large (67-100%) Exposed Structure Granulation Quality: Red, Pink Fascia Exposed: No Necrotic Amount: Small (1-33%) Fat Layer (Subcutaneous Tissue) Exposed: Yes Necrotic Quality: Adherent Slough Tendon Exposed: No Muscle Exposed: No Joint Exposed: No Bone Exposed: No DENZELLE, FARD ([id number]) [id number] Page 7 of 8 Periwound Skin Texture Texture Color No Abnormalities Noted: No No Abnormalities Noted: Yes Callus: Yes Temperature / Pain Crepitus: No Temperature: No Abnormality Excoriation: No Induration: No Rash: No Scarring: No Moisture No Abnormalities Noted: No Dry / Scaly: No Maceration: Yes Treatment Notes Wound #3 (Foot) Wound Laterality: Plantar, Left Cleanser Soap and Water Discharge Instruction: May shower and wash wound with dial antibacterial soap and water prior to dressing change. Vashe 5.8 (oz) Discharge Instruction: Cleanse the wound with Vashe prior to applying a clean dressing using gauze sponges, not tissue or cotton balls. Peri-Wound Care Zinc Oxide Ointment 30g tube Discharge Instruction: Apply Zinc Oxide to periwound with each dressing  change Topical Gentamicin Discharge Instruction: As directed by physician Mupirocin Ointment Discharge Instruction: Apply Mupirocin (Bactroban) as instructed Skintegrity Hydrogel 4 (oz) Discharge Instruction: Apply hydrogel as directed Primary Dressing Promogran Prisma Matrix, 4.34 (sq in) (silver collagen) Discharge Instruction: Moisten collagen with saline or hydrogel

## 2023-10-21 NOTE — Progress Notes (Signed)
1971-07-19 (51 y.o. [name], [name] ([id number]) [id number] Page 5 of 11 Primary Care Provider: Shan [name] Other Clinician: Referring Provider: Treating Provider/Extender: [name] in Treatment: 72 Active Problems ICD-10 Encounter Code Description Active Date MDM Diagnosis [phone] Non-pressure chronic ulcer of other part of left foot with fat layer exposed 01/24/2023 No Yes E11.621 Type 2 diabetes mellitus with foot ulcer 01/24/2023 No Yes Z89.422 Acquired absence of other left toe(s) 01/24/2023 No Yes Inactive Problems Resolved Problems Electronic Signature(s) Signed: 10/21/2023 8:21:16 AM By: [name] MD FACS Entered By: [name] on 10/21/2023  05:21:16 -------------------------------------------------------------------------------- Progress Note Details Patient Name: Date of Service: [name] [place], [name] 10/21/2023 7:45 A M Medical Record Number: [id number] Patient Account Number: [id number] Date of Birth/Sex: Treating RN: 03-22-1971 (52 y.o. M) Primary Care Provider: Shan [name] Other Clinician: Referring Provider: Treating Provider/Extender: [name] in Treatment: 73 Subjective Chief Complaint Information obtained from Patient 01/15/2023; left foot wound History of Present Illness (HPI) ADMISSION 01/11/2020 This is a 52 year old man with uncontrolled type 2 diabetes with a recent hemoglobin A1c earlier this year of 13.4. He is on insulin and glipizide. He does not take his blood sugars at home. He does have a follow-up with primary care later this month I believe on January 27. He tells [place] that roughly a month ago he was walking with a shoe with a hole in his foot. He took the shoe off and there was an open wound at roughly the left fourth met head. This has significant undermining and raised edges. He has not noticed any purulence he does not feel unwell. More recently he was taking skin off the bottom of his foot and has a superficial area on the left fifth met head. He has not been offloading this. The patient was in the ER on 12/20. They gave him Bactroban which she has been using on the wound and 10 days worth of doxycycline. No x-rays were done. He has not had vascular studies. He is also been using hydrogen peroxide. Past medical history type 2 diabetes uncontrolled, chronic systolic heart failure, coronary artery disease with a history of congestive heart failure with stents. Hypertension hyperlipidemia and chronic renal insufficiency ABI in this clinic was 1.14 on the left. 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 He is uncertain whether he would be able  to work if we put him on some form of restriction 1/19; he is generally doing quite well. Using silver alginate on the wounds. Things actually look better. He has a forefoot offloading boot which she seems to be compliant about. He has support at work to stay off his foot is much as possible which is gratifying. Culture I did last week showed a few Enterococcus faecalis. I am going to put him on Augmentin. I talked about ordering an x-ray in my note last week but that does not seem to have happened. We will review reorder the x-ray this week. [name], [name] ([id number]) [id number] Page 6 of 11 1/26; x-ray reordered last week was negative for osteomyelitis. We are using silver alginate on the wound on the third and fifth met heads on the left. He is using a Darco forefoot off loader 2/2; the area on the fifth met head is closed. Third met head is still open with tunneling depth and thick callus. 2/9; the area on the fifth met head remains closed however the third met head again has  [name], [name] ([id number]) [id number] Page 1 of 11 Visit Report for 10/21/2023 Chief Complaint Document Details Patient Name: Date of Service: [name] [place], [name] RO LD 10/21/2023 7:45 A M Medical Record Number: [id number] Patient Account Number: [id number] Date of Birth/Sex: Treating RN: January 17, 1971 (52 y.o. M) Primary Care Provider: Shan [name] Other Clinician: Referring Provider: Treating Provider/Extender: [name] in Treatment: 69 Information Obtained from: Patient Chief Complaint 01/15/2023; left foot wound Electronic Signature(s) Signed: 10/21/2023 8:22:43 AM By: [name] MD FACS Entered By: [name] on 10/21/2023 05:22:43 -------------------------------------------------------------------------------- HPI Details Patient Name: Date of Service: [name] [place], [name] 10/21/2023 7:45 A M Medical Record Number: [id number] Patient Account Number: [id number] Date of Birth/Sex: Treating RN: 02/14/1971 (52 y.o. M) Primary Care Provider: Shan [name] Other Clinician: Referring Provider: Treating Provider/Extender: [name] in Treatment: 51 History of Present Illness HPI Description: ADMISSION 01/11/2020 This is a 52 year old man with uncontrolled type 2 diabetes with a recent hemoglobin A1c earlier this year of 13.4. He is on insulin and glipizide. He does not take his blood sugars at home. He does have a follow-up with primary care later this month I believe on January 27. He tells [place] that roughly a month ago he was walking with a shoe with a hole in his foot. He took the shoe off and there was an open wound at roughly the left fourth met head. This has significant undermining and raised edges. He has not noticed any purulence he does not feel unwell. More recently he was taking skin off the bottom of his foot and has a superficial area on the left fifth met head. He has not  been offloading this. The patient was in the ER on 12/20. They gave him Bactroban which she has been using on the wound and 10 days worth of doxycycline. No x-rays were done. He has not had vascular studies. He is also been using hydrogen peroxide. Past medical history type 2 diabetes uncontrolled, chronic systolic heart failure, coronary artery disease with a history of congestive heart failure with stents. Hypertension hyperlipidemia and chronic renal insufficiency ABI in this clinic was 1.14 on the left. 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 He is uncertain whether he would be able to work if we put him on some form of restriction 1/19; he is generally doing quite well. Using silver alginate on the wounds. Things actually look better. He has a forefoot offloading boot which she seems to be compliant about. He has support at work to stay off his foot is much as possible which is gratifying. Culture I did last week showed a few Enterococcus faecalis. I am going to put him on Augmentin. I talked about ordering an x-ray in my note last week but that does not seem to have happened. We will review reorder the x-ray this week. 1/26; x-ray reordered last week was negative for osteomyelitis. We are using silver alginate on the wound on the third and fifth met heads on the left. He is using a Darco forefoot off loader 2/2; the area on the fifth met head is closed. Third met head is still open with tunneling depth and thick callus. 2/9; the area on the fifth met head remains closed however the third met head again has a small open area on presentation with tunneling in depth and surrounded by thick callus. This looks like a pressure issue. We have been using silver  [name], [name] ([id number]) [id number] Page 1 of 11 Visit Report for 10/21/2023 Chief Complaint Document Details Patient Name: Date of Service: [name] [place], [name] RO LD 10/21/2023 7:45 A M Medical Record Number: [id number] Patient Account Number: [id number] Date of Birth/Sex: Treating RN: January 17, 1971 (52 y.o. M) Primary Care Provider: Shan [name] Other Clinician: Referring Provider: Treating Provider/Extender: [name] in Treatment: 69 Information Obtained from: Patient Chief Complaint 01/15/2023; left foot wound Electronic Signature(s) Signed: 10/21/2023 8:22:43 AM By: [name] MD FACS Entered By: [name] on 10/21/2023 05:22:43 -------------------------------------------------------------------------------- HPI Details Patient Name: Date of Service: [name] [place], [name] 10/21/2023 7:45 A M Medical Record Number: [id number] Patient Account Number: [id number] Date of Birth/Sex: Treating RN: 02/14/1971 (52 y.o. M) Primary Care Provider: Shan [name] Other Clinician: Referring Provider: Treating Provider/Extender: [name] in Treatment: 51 History of Present Illness HPI Description: ADMISSION 01/11/2020 This is a 52 year old man with uncontrolled type 2 diabetes with a recent hemoglobin A1c earlier this year of 13.4. He is on insulin and glipizide. He does not take his blood sugars at home. He does have a follow-up with primary care later this month I believe on January 27. He tells [place] that roughly a month ago he was walking with a shoe with a hole in his foot. He took the shoe off and there was an open wound at roughly the left fourth met head. This has significant undermining and raised edges. He has not noticed any purulence he does not feel unwell. More recently he was taking skin off the bottom of his foot and has a superficial area on the left fifth met head. He has not  been offloading this. The patient was in the ER on 12/20. They gave him Bactroban which she has been using on the wound and 10 days worth of doxycycline. No x-rays were done. He has not had vascular studies. He is also been using hydrogen peroxide. Past medical history type 2 diabetes uncontrolled, chronic systolic heart failure, coronary artery disease with a history of congestive heart failure with stents. Hypertension hyperlipidemia and chronic renal insufficiency ABI in this clinic was 1.14 on the left. 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 He is uncertain whether he would be able to work if we put him on some form of restriction 1/19; he is generally doing quite well. Using silver alginate on the wounds. Things actually look better. He has a forefoot offloading boot which she seems to be compliant about. He has support at work to stay off his foot is much as possible which is gratifying. Culture I did last week showed a few Enterococcus faecalis. I am going to put him on Augmentin. I talked about ordering an x-ray in my note last week but that does not seem to have happened. We will review reorder the x-ray this week. 1/26; x-ray reordered last week was negative for osteomyelitis. We are using silver alginate on the wound on the third and fifth met heads on the left. He is using a Darco forefoot off loader 2/2; the area on the fifth met head is closed. Third met head is still open with tunneling depth and thick callus. 2/9; the area on the fifth met head remains closed however the third met head again has a small open area on presentation with tunneling in depth and surrounded by thick callus. This looks like a pressure issue. We have been using silver  Date of Service: [name] [place], [name] RO LD 10/21/2023 7:45 A M Medical Record Number: [id number] Patient Account Number: [id number] Date of Birth/Sex: Treating RN: 14-Apr-1971 (52 y.o. M) Primary Care Provider: Shan [name] Other Clinician: Referring Provider: Treating Provider/Extender: [name] in Treatment: 38 Constitutional . . . . no acute distress. Respiratory Normal work of breathing on room air. Notes 10/21/2023: We have recovered most of what was lost as far as progress with his absence from clinic. The wound did measure smaller today. It is a little bit macerated around the edges and he reports that he thinks some water got in over the top of his cast while showering. Electronic Signature(s) Signed: 10/21/2023 8:24:18 AM By: [name] MD FACS Entered By: [name] on 10/21/2023 05:24:18 -------------------------------------------------------------------------------- Physician Orders Details Patient Name: Date of Service: [name] [place],  [name] 10/21/2023 7:45 A M Medical Record Number: [id number] Patient Account Number: [id number] Date of Birth/Sex: Treating RN: 07/07/1971 (52 y.o. [name] Primary Care Provider: Shan [name] Other Clinician: Referring Provider: Treating Provider/Extender: [name] in Treatment: [address] [place] ([id number]) [id number] Page 4 of 11 The following information was scribed by: [name] The information was scribed for: [name] Verbal / Phone Orders: No Diagnosis Coding ICD-10 Coding Code Description [phone] 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.621 Type 2 diabetes mellitus with foot ulcer Z89.422 Acquired absence of other left toe(s) Follow-up Appointments ppointment in 1 week. - Dr. Lady Gary - room 2 Return A Bathing/ Shower/ Hygiene May shower with protection but do not get wound dressing(s) wet. Protect dressing(s) with water repellant cover (for example, large plastic bag) or a cast cover and may then take shower. Off-Loading Total Contact Cast to Left Lower Extremity - TCC size 3 heel cup to toes. foam pad to anterior ankle Removable cast walker boot to: - left foot Wound Treatment Wound #3 - Foot Wound Laterality: Plantar, Left Cleanser: Soap and Water 1 x Per Day/30 Days Discharge Instructions: May shower and wash wound with dial antibacterial soap and water prior to dressing change. Cleanser: Vashe 5.8 (oz) 1 x Per Day/30 Days Discharge Instructions: Cleanse the wound with Vashe prior to applying a clean dressing using gauze sponges, not tissue or cotton balls. Peri-Wound Care: Zinc Oxide Ointment 30g tube 1 x Per Day/30 Days Discharge Instructions: Apply Zinc Oxide to periwound with each dressing change Topical: Gentamicin 1 x Per Day/30 Days Discharge Instructions: As directed by physician Topical: Mupirocin Ointment 1 x Per Day/30  Days Discharge Instructions: Apply Mupirocin (Bactroban) as instructed Topical: Skintegrity Hydrogel 4 (oz) 1 x Per Day/30 Days Discharge Instructions: Apply hydrogel as directed Prim Dressing: Promogran Prisma Matrix, 4.34 (sq in) (silver collagen) 1 x Per Day/30 Days ary Discharge Instructions: Moisten collagen with saline or hydrogel Secondary Dressing: Woven Gauze Sponge, Non-Sterile 4x4 in 1 x Per Day/30 Days Discharge Instructions: Apply over primary dressing as directed. Secondary Dressing: Zetuvit Plus 4x8 in 1 x Per Day/30 Days Discharge Instructions: Apply over primary dressing as directed. Secured With: [employer], 4.5x3.1 (in/yd) 1 x Per Day/30 Days Discharge Instructions: Secure with Kerlix as directed. Electronic Signature(s) Signed: 10/21/2023 8:35:41 AM By: [name] MD FACS Entered By: [name] on 10/21/2023 05:27:41 -------------------------------------------------------------------------------- Problem List Details Patient Name: Date of Service: [name] [place], [name] 10/21/2023 7:45 A M Medical Record Number: [id number] Patient Account Number: [id number] Date of Birth/Sex: Treating RN:  Neuropathy Negative for: Dementia; Quadriplegia; Paraplegia; Seizure Disorder Oncologic Medical History: Negative for: Received Chemotherapy; Received Radiation Psychiatric Medical History: Negative for: Anorexia/bulimia; Confinement Anxiety HBO Extended History Items Ear/Nose/Mouth/Throat: Chronic sinus problems/congestion Immunizations Pneumococcal Vaccine: Received Pneumococcal Vaccination: No Implantable Devices None Hospitalization / Surgery History Type of Hospitalization/Surgery Heart Cath in 2018 Family and Social History Cancer: No; Diabetes: No; Heart Disease: Yes - Mother,Father; Hereditary Spherocytosis: No; Hypertension: No; Kidney Disease: No; Lung Disease: No; Seizures: No; Stroke: No; Thyroid Problems: No; Tuberculosis: No; Never smoker; Marital Status - Single; Alcohol Use: Rarely; Drug  Use: No History; Caffeine Use: Rarely; Financial Concerns: No; Food, Clothing or Shelter Needs: No; Support System Lacking: No; Transportation Concerns: No Electronic Signature(s) Signed: 10/21/2023 8:35:41 AM By: [name] MD FACS Entered By: [name] on 10/21/2023 05:23:50 Total Contact Cast Details -------------------------------------------------------------------------------- [name] ([id number]) [phone].pdf Page 11 of 11 Patient Name: Date of Service: [name] [place], [name] RO LD 10/21/2023 7:45 A M Medical Record Number: [id number] Patient Account Number: [id number] Date of Birth/Sex: Treating RN: December 06, 1971 (52 y.o. [name] Primary Care Provider: Shan [name] Other Clinician: Referring Provider: Treating Provider/Extender: [name] in Treatment: 65 T Contact Cast Applied for Wound Assessment: otal Wound #3 Left,Plantar Foot Performed By: Physician [name], MD The following information was scribed by: [name] The information was scribed for: [name] Post Procedure Diagnosis Same as Pre-procedure Electronic Signature(s) Signed: 10/21/2023 8:35:41 AM By: [name] MD FACS Signed: 10/21/2023 3:45:49 PM By: Gelene Mink By: [name] on 10/21/2023 04:55:54 -------------------------------------------------------------------------------- SuperBill Details Patient Name: Date of Service: [name] [place], [name] 10/21/2023 Medical Record Number: [id number] Patient Account Number: [id number] Date of Birth/Sex: Treating RN: 1971/01/30 (52 y.o. M) Primary Care Provider: Shan [name] Other Clinician: Referring Provider: Treating Provider/Extender: [name] in Treatment: 38 Diagnosis Coding ICD-10 Codes Code Description [phone] Non-pressure chronic ulcer of other part of left foot with fat layer  exposed E11.621 Type 2 diabetes mellitus with foot ulcer Z89.422 Acquired absence of other left toe(s) Facility Procedures : CPT4 Code: [id number] Description: [id number] - APPLY TOTAL CONTACT LEG CAST ICD-10 Diagnosis Description L97.522 Non-pressure chronic ulcer of other part of left foot with fat layer exposed Modifier: Quantity: 1 Physician Procedures : CPT4 Code Description Modifier [id number] - WC PHYS LEVEL 4 - EST PT 25 ICD-10 Diagnosis Description L97.522 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.621 Type 2 diabetes mellitus with foot ulcer Z89.422  Acquired absence of other left toe(s) Quantity: 1 : [id number] - WC PHYS APPLY TOTAL CONTACT CAST ICD-10 Diagnosis Description L97.522 Non-pressure chronic ulcer of other part of left foot with fat layer exposed Quantity: 1 Electronic Signature(s) Signed: 10/21/2023 8:28:59 AM By: [name] MD FACS Entered By: [name] on 10/21/2023 16:01:09  shower. Off-Loading: T Contact Cast to Left Lower Extremity - TCC size 3 heel cup to toes. foam pad to anterior ankle otal Removable cast walker boot to: - left foot WOUND #3: - Foot Wound Laterality: Plantar, Left Cleanser: Soap and Water 1 x Per Day/30 Days Discharge Instructions: May shower and wash wound with dial antibacterial soap and water prior to dressing change. Cleanser: Vashe 5.8 (oz) 1 x Per Day/30 Days Discharge Instructions: Cleanse the wound with Vashe prior to applying a clean dressing using gauze sponges, not tissue or cotton balls. Peri-Wound Care: Zinc Oxide Ointment 30g tube 1 x Per Day/30 Days Discharge Instructions: Apply Zinc Oxide to periwound with each dressing change Topical: Gentamicin 1 x Per Day/30 Days Discharge Instructions: As directed by physician Topical: Mupirocin Ointment 1 x Per Day/30 Days Discharge Instructions: Apply Mupirocin (Bactroban) as instructed Topical: Skintegrity Hydrogel 4 (oz) 1 x Per Day/30 Days Discharge Instructions: Apply hydrogel as directed Prim Dressing: Promogran Prisma Matrix, 4.34 (sq in) (silver collagen) 1 x Per Day/30 Days ary Discharge Instructions: Moisten collagen with saline or hydrogel Secondary Dressing: Woven Gauze Sponge, Non-Sterile 4x4 in 1 x Per Day/30 Days Discharge Instructions: Apply over primary dressing as directed. Secondary Dressing: Zetuvit Plus 4x8 in 1 x Per Day/30  Days [name], [name] ([id number]) [phone].pdf Page 9 of 11 Discharge Instructions: Apply over primary dressing as directed. Secured With: [employer], 4.5x3.1 (in/yd) 1 x Per Day/30 Days Discharge Instructions: Secure with Kerlix as directed. 10/21/2023: We have recovered most of what was lost as far as progress with his absence from clinic. The wound did measure smaller today. It is a little bit macerated around the edges and he reports that he thinks some water got in over the top of his cast while showering. The wound bed was prepared for total contact casting. Topical gentamicin and mupirocin were applied followed by Prisma silver collagen. The total contact cast procedure was performed in standard fashion. Follow-up in 1 week. Electronic Signature(s) Signed: 10/21/2023 8:28:29 AM By: [name] MD FACS Entered By: [name] on 10/21/2023 05:28:29 -------------------------------------------------------------------------------- HxROS Details Patient Name: Date of Service: [name] [place], [name] 10/21/2023 7:45 A M Medical Record Number: [id number] Patient Account Number: [id number] Date of Birth/Sex: Treating RN: 1970/12/31 (52 y.o. M) Primary Care Provider: Shan [name] Other Clinician: Referring Provider: Treating Provider/Extender: [name] in Treatment: 38 Information Obtained From Patient Eyes Medical History: Negative for: Cataracts; Glaucoma; Optic Neuritis Ear/Nose/Mouth/Throat Medical History: Positive for: Chronic sinus problems/congestion Negative for: Middle ear problems Hematologic/Lymphatic Medical History: Negative for: Anemia; Hemophilia; Human Immunodeficiency Virus; Lymphedema; Sickle Cell Disease Respiratory Medical History: Negative for: Aspiration; Asthma; Chronic Obstructive Pulmonary Disease (COPD); Pneumothorax; Sleep Apnea; Tuberculosis Cardiovascular Medical  History: Positive for: Congestive Heart Failure; Coronary Artery Disease; Hypertension; Myocardial Infarction - age 18 Negative for: Angina; Arrhythmia; Deep Vein Thrombosis; Hypotension; Peripheral Arterial Disease; Peripheral Venous Disease; Phlebitis; Vasculitis Past Medical History Notes: Ischemic Cardiomyopathy Gastrointestinal Medical History: Negative for: Cirrhosis ; Colitis; Crohns; Hepatitis A; Hepatitis B; Hepatitis C Endocrine Medical History: Positive for: Type II Diabetes Time with diabetes: ten years [name], [name] ([id number]) [phone].pdf Page 10 of 11 Treated with: Insulin Blood sugar tested every day: No Genitourinary Medical History: Negative for: End Stage Renal Disease Past Medical History Notes: Renal Insufficiency Immunological Medical History: Negative for: Lupus Erythematosus; Raynauds; Scleroderma Integumentary (Skin) Medical History: Negative for: History of Burn Musculoskeletal Medical History: Negative for: Gout; Rheumatoid Arthritis; Osteoarthritis; Osteomyelitis Neurologic Medical History: Positive for:  1971-07-19 (51 y.o. [name], [name] ([id number]) [id number] Page 5 of 11 Primary Care Provider: Shan [name] Other Clinician: Referring Provider: Treating Provider/Extender: [name] in Treatment: 72 Active Problems ICD-10 Encounter Code Description Active Date MDM Diagnosis [phone] Non-pressure chronic ulcer of other part of left foot with fat layer exposed 01/24/2023 No Yes E11.621 Type 2 diabetes mellitus with foot ulcer 01/24/2023 No Yes Z89.422 Acquired absence of other left toe(s) 01/24/2023 No Yes Inactive Problems Resolved Problems Electronic Signature(s) Signed: 10/21/2023 8:21:16 AM By: [name] MD FACS Entered By: [name] on 10/21/2023  05:21:16 -------------------------------------------------------------------------------- Progress Note Details Patient Name: Date of Service: [name] [place], [name] 10/21/2023 7:45 A M Medical Record Number: [id number] Patient Account Number: [id number] Date of Birth/Sex: Treating RN: 03-22-1971 (52 y.o. M) Primary Care Provider: Shan [name] Other Clinician: Referring Provider: Treating Provider/Extender: [name] in Treatment: 73 Subjective Chief Complaint Information obtained from Patient 01/15/2023; left foot wound History of Present Illness (HPI) ADMISSION 01/11/2020 This is a 52 year old man with uncontrolled type 2 diabetes with a recent hemoglobin A1c earlier this year of 13.4. He is on insulin and glipizide. He does not take his blood sugars at home. He does have a follow-up with primary care later this month I believe on January 27. He tells [place] that roughly a month ago he was walking with a shoe with a hole in his foot. He took the shoe off and there was an open wound at roughly the left fourth met head. This has significant undermining and raised edges. He has not noticed any purulence he does not feel unwell. More recently he was taking skin off the bottom of his foot and has a superficial area on the left fifth met head. He has not been offloading this. The patient was in the ER on 12/20. They gave him Bactroban which she has been using on the wound and 10 days worth of doxycycline. No x-rays were done. He has not had vascular studies. He is also been using hydrogen peroxide. Past medical history type 2 diabetes uncontrolled, chronic systolic heart failure, coronary artery disease with a history of congestive heart failure with stents. Hypertension hyperlipidemia and chronic renal insufficiency ABI in this clinic was 1.14 on the left. 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 He is uncertain whether he would be able  to work if we put him on some form of restriction 1/19; he is generally doing quite well. Using silver alginate on the wounds. Things actually look better. He has a forefoot offloading boot which she seems to be compliant about. He has support at work to stay off his foot is much as possible which is gratifying. Culture I did last week showed a few Enterococcus faecalis. I am going to put him on Augmentin. I talked about ordering an x-ray in my note last week but that does not seem to have happened. We will review reorder the x-ray this week. [name], [name] ([id number]) [id number] Page 6 of 11 1/26; x-ray reordered last week was negative for osteomyelitis. We are using silver alginate on the wound on the third and fifth met heads on the left. He is using a Darco forefoot off loader 2/2; the area on the fifth met head is closed. Third met head is still open with tunneling depth and thick callus. 2/9; the area on the fifth met head remains closed however the third met head again has  shower. Off-Loading: T Contact Cast to Left Lower Extremity - TCC size 3 heel cup to toes. foam pad to anterior ankle otal Removable cast walker boot to: - left foot WOUND #3: - Foot Wound Laterality: Plantar, Left Cleanser: Soap and Water 1 x Per Day/30 Days Discharge Instructions: May shower and wash wound with dial antibacterial soap and water prior to dressing change. Cleanser: Vashe 5.8 (oz) 1 x Per Day/30 Days Discharge Instructions: Cleanse the wound with Vashe prior to applying a clean dressing using gauze sponges, not tissue or cotton balls. Peri-Wound Care: Zinc Oxide Ointment 30g tube 1 x Per Day/30 Days Discharge Instructions: Apply Zinc Oxide to periwound with each dressing change Topical: Gentamicin 1 x Per Day/30 Days Discharge Instructions: As directed by physician Topical: Mupirocin Ointment 1 x Per Day/30 Days Discharge Instructions: Apply Mupirocin (Bactroban) as instructed Topical: Skintegrity Hydrogel 4 (oz) 1 x Per Day/30 Days Discharge Instructions: Apply hydrogel as directed Prim Dressing: Promogran Prisma Matrix, 4.34 (sq in) (silver collagen) 1 x Per Day/30 Days ary Discharge Instructions: Moisten collagen with saline or hydrogel Secondary Dressing: Woven Gauze Sponge, Non-Sterile 4x4 in 1 x Per Day/30 Days Discharge Instructions: Apply over primary dressing as directed. Secondary Dressing: Zetuvit Plus 4x8 in 1 x Per Day/30  Days [name], [name] ([id number]) [phone].pdf Page 9 of 11 Discharge Instructions: Apply over primary dressing as directed. Secured With: [employer], 4.5x3.1 (in/yd) 1 x Per Day/30 Days Discharge Instructions: Secure with Kerlix as directed. 10/21/2023: We have recovered most of what was lost as far as progress with his absence from clinic. The wound did measure smaller today. It is a little bit macerated around the edges and he reports that he thinks some water got in over the top of his cast while showering. The wound bed was prepared for total contact casting. Topical gentamicin and mupirocin were applied followed by Prisma silver collagen. The total contact cast procedure was performed in standard fashion. Follow-up in 1 week. Electronic Signature(s) Signed: 10/21/2023 8:28:29 AM By: [name] MD FACS Entered By: [name] on 10/21/2023 05:28:29 -------------------------------------------------------------------------------- HxROS Details Patient Name: Date of Service: [name] IN, [name] 10/21/2023 7:45 A M Medical Record Number: [id number] Patient Account Number: [id number] Date of Birth/Sex: Treating RN: 1970/12/31 (52 y.o. M) Primary Care Provider: Shan [name] Other Clinician: Referring Provider: Treating Provider/Extender: [name] in Treatment: 38 Information Obtained From Patient Eyes Medical History: Negative for: Cataracts; Glaucoma; Optic Neuritis Ear/Nose/Mouth/Throat Medical History: Positive for: Chronic sinus problems/congestion Negative for: Middle ear problems Hematologic/Lymphatic Medical History: Negative for: Anemia; Hemophilia; Human Immunodeficiency Virus; Lymphedema; Sickle Cell Disease Respiratory Medical History: Negative for: Aspiration; Asthma; Chronic Obstructive Pulmonary Disease (COPD); Pneumothorax; Sleep Apnea; Tuberculosis Cardiovascular Medical  History: Positive for: Congestive Heart Failure; Coronary Artery Disease; Hypertension; Myocardial Infarction - age 18 Negative for: Angina; Arrhythmia; Deep Vein Thrombosis; Hypotension; Peripheral Arterial Disease; Peripheral Venous Disease; Phlebitis; Vasculitis Past Medical History Notes: Ischemic Cardiomyopathy Gastrointestinal Medical History: Negative for: Cirrhosis ; Colitis; Crohns; Hepatitis A; Hepatitis B; Hepatitis C Endocrine Medical History: Positive for: Type II Diabetes Time with diabetes: ten years [name], [name] ([id number]) [phone].pdf Page 10 of 11 Treated with: Insulin Blood sugar tested every day: No Genitourinary Medical History: Negative for: End Stage Renal Disease Past Medical History Notes: Renal Insufficiency Immunological Medical History: Negative for: Lupus Erythematosus; Raynauds; Scleroderma Integumentary (Skin) Medical History: Negative for: History of Burn Musculoskeletal Medical History: Negative for: Gout; Rheumatoid Arthritis; Osteoarthritis; Osteomyelitis Neurologic Medical History: Positive for:  [name], [name] ([id number]) [id number] Page 1 of 11 Visit Report for 10/21/2023 Chief Complaint Document Details Patient Name: Date of Service: [name] [place], [name] RO LD 10/21/2023 7:45 A M Medical Record Number: [id number] Patient Account Number: [id number] Date of Birth/Sex: Treating RN: January 17, 1971 (52 y.o. M) Primary Care Provider: Shan [name] Other Clinician: Referring Provider: Treating Provider/Extender: [name] in Treatment: 69 Information Obtained from: Patient Chief Complaint 01/15/2023; left foot wound Electronic Signature(s) Signed: 10/21/2023 8:22:43 AM By: [name] MD FACS Entered By: [name] on 10/21/2023 05:22:43 -------------------------------------------------------------------------------- HPI Details Patient Name: Date of Service: [name] IN, [name] 10/21/2023 7:45 A M Medical Record Number: [id number] Patient Account Number: [id number] Date of Birth/Sex: Treating RN: 02/14/1971 (52 y.o. M) Primary Care Provider: Shan [name] Other Clinician: Referring Provider: Treating Provider/Extender: [name] in Treatment: 51 History of Present Illness HPI Description: ADMISSION 01/11/2020 This is a 52 year old man with uncontrolled type 2 diabetes with a recent hemoglobin A1c earlier this year of 13.4. He is on insulin and glipizide. He does not take his blood sugars at home. He does have a follow-up with primary care later this month I believe on January 27. He tells [place] that roughly a month ago he was walking with a shoe with a hole in his foot. He took the shoe off and there was an open wound at roughly the left fourth met head. This has significant undermining and raised edges. He has not noticed any purulence he does not feel unwell. More recently he was taking skin off the bottom of his foot and has a superficial area on the left fifth met head. He has not  been offloading this. The patient was in the ER on 12/20. They gave him Bactroban which she has been using on the wound and 10 days worth of doxycycline. No x-rays were done. He has not had vascular studies. He is also been using hydrogen peroxide. Past medical history type 2 diabetes uncontrolled, chronic systolic heart failure, coronary artery disease with a history of congestive heart failure with stents. Hypertension hyperlipidemia and chronic renal insufficiency ABI in this clinic was 1.14 on the left. 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 He is uncertain whether he would be able to work if we put him on some form of restriction 1/19; he is generally doing quite well. Using silver alginate on the wounds. Things actually look better. He has a forefoot offloading boot which she seems to be compliant about. He has support at work to stay off his foot is much as possible which is gratifying. Culture I did last week showed a few Enterococcus faecalis. I am going to put him on Augmentin. I talked about ordering an x-ray in my note last week but that does not seem to have happened. We will review reorder the x-ray this week. 1/26; x-ray reordered last week was negative for osteomyelitis. We are using silver alginate on the wound on the third and fifth met heads on the left. He is using a Darco forefoot off loader 2/2; the area on the fifth met head is closed. Third met head is still open with tunneling depth and thick callus. 2/9; the area on the fifth met head remains closed however the third met head again has a small open area on presentation with tunneling in depth and surrounded by thick callus. This looks like a pressure issue. We have been using silver  [name], [name] ([id number]) [id number] Page 1 of 11 Visit Report for 10/21/2023 Chief Complaint Document Details Patient Name: Date of Service: [name] [place], [name] RO LD 10/21/2023 7:45 A M Medical Record Number: [id number] Patient Account Number: [id number] Date of Birth/Sex: Treating RN: January 17, 1971 (52 y.o. M) Primary Care Provider: Shan [name] Other Clinician: Referring Provider: Treating Provider/Extender: [name] in Treatment: 69 Information Obtained from: Patient Chief Complaint 01/15/2023; left foot wound Electronic Signature(s) Signed: 10/21/2023 8:22:43 AM By: [name] MD FACS Entered By: [name] on 10/21/2023 05:22:43 -------------------------------------------------------------------------------- HPI Details Patient Name: Date of Service: [name] IN, [name] 10/21/2023 7:45 A M Medical Record Number: [id number] Patient Account Number: [id number] Date of Birth/Sex: Treating RN: 02/14/1971 (52 y.o. M) Primary Care Provider: Shan [name] Other Clinician: Referring Provider: Treating Provider/Extender: [name] in Treatment: 51 History of Present Illness HPI Description: ADMISSION 01/11/2020 This is a 52 year old man with uncontrolled type 2 diabetes with a recent hemoglobin A1c earlier this year of 13.4. He is on insulin and glipizide. He does not take his blood sugars at home. He does have a follow-up with primary care later this month I believe on January 27. He tells [place] that roughly a month ago he was walking with a shoe with a hole in his foot. He took the shoe off and there was an open wound at roughly the left fourth met head. This has significant undermining and raised edges. He has not noticed any purulence he does not feel unwell. More recently he was taking skin off the bottom of his foot and has a superficial area on the left fifth met head. He has not  been offloading this. The patient was in the ER on 12/20. They gave him Bactroban which she has been using on the wound and 10 days worth of doxycycline. No x-rays were done. He has not had vascular studies. He is also been using hydrogen peroxide. Past medical history type 2 diabetes uncontrolled, chronic systolic heart failure, coronary artery disease with a history of congestive heart failure with stents. Hypertension hyperlipidemia and chronic renal insufficiency ABI in this clinic was 1.14 on the left. 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 He is uncertain whether he would be able to work if we put him on some form of restriction 1/19; he is generally doing quite well. Using silver alginate on the wounds. Things actually look better. He has a forefoot offloading boot which she seems to be compliant about. He has support at work to stay off his foot is much as possible which is gratifying. Culture I did last week showed a few Enterococcus faecalis. I am going to put him on Augmentin. I talked about ordering an x-ray in my note last week but that does not seem to have happened. We will review reorder the x-ray this week. 1/26; x-ray reordered last week was negative for osteomyelitis. We are using silver alginate on the wound on the third and fifth met heads on the left. He is using a Darco forefoot off loader 2/2; the area on the fifth met head is closed. Third met head is still open with tunneling depth and thick callus. 2/9; the area on the fifth met head remains closed however the third met head again has a small open area on presentation with tunneling in depth and surrounded by thick callus. This looks like a pressure issue. We have been using silver  [name], [name] ([id number]) [id number] Page 1 of 11 Visit Report for 10/21/2023 Chief Complaint Document Details Patient Name: Date of Service: [name] [place], [name] RO LD 10/21/2023 7:45 A M Medical Record Number: [id number] Patient Account Number: [id number] Date of Birth/Sex: Treating RN: January 17, 1971 (52 y.o. M) Primary Care Provider: Shan [name] Other Clinician: Referring Provider: Treating Provider/Extender: [name] in Treatment: 69 Information Obtained from: Patient Chief Complaint 01/15/2023; left foot wound Electronic Signature(s) Signed: 10/21/2023 8:22:43 AM By: [name] MD FACS Entered By: [name] on 10/21/2023 05:22:43 -------------------------------------------------------------------------------- HPI Details Patient Name: Date of Service: [name] [place], [name] 10/21/2023 7:45 A M Medical Record Number: [id number] Patient Account Number: [id number] Date of Birth/Sex: Treating RN: 02/14/1971 (52 y.o. M) Primary Care Provider: Shan [name] Other Clinician: Referring Provider: Treating Provider/Extender: [name] in Treatment: 51 History of Present Illness HPI Description: ADMISSION 01/11/2020 This is a 52 year old man with uncontrolled type 2 diabetes with a recent hemoglobin A1c earlier this year of 13.4. He is on insulin and glipizide. He does not take his blood sugars at home. He does have a follow-up with primary care later this month I believe on January 27. He tells [place] that roughly a month ago he was walking with a shoe with a hole in his foot. He took the shoe off and there was an open wound at roughly the left fourth met head. This has significant undermining and raised edges. He has not noticed any purulence he does not feel unwell. More recently he was taking skin off the bottom of his foot and has a superficial area on the left fifth met head. He has not  been offloading this. The patient was in the ER on 12/20. They gave him Bactroban which she has been using on the wound and 10 days worth of doxycycline. No x-rays were done. He has not had vascular studies. He is also been using hydrogen peroxide. Past medical history type 2 diabetes uncontrolled, chronic systolic heart failure, coronary artery disease with a history of congestive heart failure with stents. Hypertension hyperlipidemia and chronic renal insufficiency ABI in this clinic was 1.14 on the left. 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 He is uncertain whether he would be able to work if we put him on some form of restriction 1/19; he is generally doing quite well. Using silver alginate on the wounds. Things actually look better. He has a forefoot offloading boot which she seems to be compliant about. He has support at work to stay off his foot is much as possible which is gratifying. Culture I did last week showed a few Enterococcus faecalis. I am going to put him on Augmentin. I talked about ordering an x-ray in my note last week but that does not seem to have happened. We will review reorder the x-ray this week. 1/26; x-ray reordered last week was negative for osteomyelitis. We are using silver alginate on the wound on the third and fifth met heads on the left. He is using a Darco forefoot off loader 2/2; the area on the fifth met head is closed. Third met head is still open with tunneling depth and thick callus. 2/9; the area on the fifth met head remains closed however the third met head again has a small open area on presentation with tunneling in depth and surrounded by thick callus. This looks like a pressure issue. We have been using silver  [name], [name] ([id number]) [id number] Page 1 of 11 Visit Report for 10/21/2023 Chief Complaint Document Details Patient Name: Date of Service: [name] [place], [name] RO LD 10/21/2023 7:45 A M Medical Record Number: [id number] Patient Account Number: [id number] Date of Birth/Sex: Treating RN: January 17, 1971 (52 y.o. M) Primary Care Provider: Shan [name] Other Clinician: Referring Provider: Treating Provider/Extender: [name] in Treatment: 69 Information Obtained from: Patient Chief Complaint 01/15/2023; left foot wound Electronic Signature(s) Signed: 10/21/2023 8:22:43 AM By: [name] MD FACS Entered By: [name] on 10/21/2023 05:22:43 -------------------------------------------------------------------------------- HPI Details Patient Name: Date of Service: [name] [place], [name] 10/21/2023 7:45 A M Medical Record Number: [id number] Patient Account Number: [id number] Date of Birth/Sex: Treating RN: 02/14/1971 (52 y.o. M) Primary Care Provider: Shan [name] Other Clinician: Referring Provider: Treating Provider/Extender: [name] in Treatment: 51 History of Present Illness HPI Description: ADMISSION 01/11/2020 This is a 52 year old man with uncontrolled type 2 diabetes with a recent hemoglobin A1c earlier this year of 13.4. He is on insulin and glipizide. He does not take his blood sugars at home. He does have a follow-up with primary care later this month I believe on January 27. He tells [place] that roughly a month ago he was walking with a shoe with a hole in his foot. He took the shoe off and there was an open wound at roughly the left fourth met head. This has significant undermining and raised edges. He has not noticed any purulence he does not feel unwell. More recently he was taking skin off the bottom of his foot and has a superficial area on the left fifth met head. He has not  been offloading this. The patient was in the ER on 12/20. They gave him Bactroban which she has been using on the wound and 10 days worth of doxycycline. No x-rays were done. He has not had vascular studies. He is also been using hydrogen peroxide. Past medical history type 2 diabetes uncontrolled, chronic systolic heart failure, coronary artery disease with a history of congestive heart failure with stents. Hypertension hyperlipidemia and chronic renal insufficiency ABI in this clinic was 1.14 on the left. 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 He is uncertain whether he would be able to work if we put him on some form of restriction 1/19; he is generally doing quite well. Using silver alginate on the wounds. Things actually look better. He has a forefoot offloading boot which she seems to be compliant about. He has support at work to stay off his foot is much as possible which is gratifying. Culture I did last week showed a few Enterococcus faecalis. I am going to put him on Augmentin. I talked about ordering an x-ray in my note last week but that does not seem to have happened. We will review reorder the x-ray this week. 1/26; x-ray reordered last week was negative for osteomyelitis. We are using silver alginate on the wound on the third and fifth met heads on the left. He is using a Darco forefoot off loader 2/2; the area on the fifth met head is closed. Third met head is still open with tunneling depth and thick callus. 2/9; the area on the fifth met head remains closed however the third met head again has a small open area on presentation with tunneling in depth and surrounded by thick callus. This looks like a pressure issue. We have been using silver

## 2023-10-23 ENCOUNTER — Other Ambulatory Visit: Payer: Self-pay

## 2023-10-28 ENCOUNTER — Other Ambulatory Visit: Payer: Self-pay

## 2023-10-28 ENCOUNTER — Other Ambulatory Visit (HOSPITAL_COMMUNITY): Payer: Self-pay

## 2023-10-28 ENCOUNTER — Encounter (HOSPITAL_BASED_OUTPATIENT_CLINIC_OR_DEPARTMENT_OTHER): Payer: 59 | Admitting: General Surgery

## 2023-10-28 DIAGNOSIS — Z794 Long term (current) use of insulin: Secondary | ICD-10-CM | POA: Diagnosis not present

## 2023-10-28 DIAGNOSIS — N189 Chronic kidney disease, unspecified: Secondary | ICD-10-CM | POA: Diagnosis not present

## 2023-10-28 DIAGNOSIS — L97522 Non-pressure chronic ulcer of other part of left foot with fat layer exposed: Secondary | ICD-10-CM | POA: Diagnosis not present

## 2023-10-28 DIAGNOSIS — I252 Old myocardial infarction: Secondary | ICD-10-CM | POA: Diagnosis not present

## 2023-10-28 DIAGNOSIS — Z89422 Acquired absence of other left toe(s): Secondary | ICD-10-CM | POA: Diagnosis not present

## 2023-10-28 DIAGNOSIS — I5042 Chronic combined systolic (congestive) and diastolic (congestive) heart failure: Secondary | ICD-10-CM | POA: Diagnosis not present

## 2023-10-28 DIAGNOSIS — E1122 Type 2 diabetes mellitus with diabetic chronic kidney disease: Secondary | ICD-10-CM | POA: Diagnosis not present

## 2023-10-28 DIAGNOSIS — E785 Hyperlipidemia, unspecified: Secondary | ICD-10-CM | POA: Diagnosis not present

## 2023-10-28 DIAGNOSIS — E1140 Type 2 diabetes mellitus with diabetic neuropathy, unspecified: Secondary | ICD-10-CM | POA: Diagnosis not present

## 2023-10-28 DIAGNOSIS — Z955 Presence of coronary angioplasty implant and graft: Secondary | ICD-10-CM | POA: Diagnosis not present

## 2023-10-28 DIAGNOSIS — I2510 Atherosclerotic heart disease of native coronary artery without angina pectoris: Secondary | ICD-10-CM | POA: Diagnosis not present

## 2023-10-28 DIAGNOSIS — I130 Hypertensive heart and chronic kidney disease with heart failure and stage 1 through stage 4 chronic kidney disease, or unspecified chronic kidney disease: Secondary | ICD-10-CM | POA: Diagnosis not present

## 2023-10-28 DIAGNOSIS — E11621 Type 2 diabetes mellitus with foot ulcer: Secondary | ICD-10-CM | POA: Diagnosis not present

## 2023-10-28 DIAGNOSIS — Z7984 Long term (current) use of oral hypoglycemic drugs: Secondary | ICD-10-CM | POA: Diagnosis not present

## 2023-10-28 NOTE — Progress Notes (Signed)
was seen earlier this week and plan was for a total contact cast but there is none available. The cast finally arrived in patient has come in today to have this placed. We will continue with Hydrofera Blue and antibiotic ointment under the cast. 9/12; patient presents for follow-up. We have been using Hydrofera Blue and antibiotic ointment under the cast. Wound is stable. He denies signs of infection. 09/17/2023: The wound is measuring smaller today. The surface appears healthy and there is minimal accumulation of slough. 09/23/2023: The wound external measurements remain about the same, but it is shallower and visually appears smaller. 09/30/2023: The wound is covered  with a layer of callus. Underneath the callus, there is a tiny superficial residual opening. 10/14/2023: He returns after being away for 2 weeks, during which he was out of a cast. Not surprisingly, his wound has reopened substantially. It remains clean, however. 10/21/2023: We have recovered most of what was lost as far as progress with his absence from clinic. The wound did measure smaller today. It is a little bit ULYSS, CLENDENIN ([id number]) [id number] Page 4 of 12 macerated around the edges and he reports that he thinks some water got in over the top of his cast while showering. 10/28/2023: The wound measures smaller today. There is no tissue maceration. There is some slough on the surface and a little bit of callus accumulation around the edges. Electronic Signature(s) Signed: 10/28/2023 8:11:59 AM By: [name] MD FACS Entered By: [name] on 10/28/2023 08:11:59 -------------------------------------------------------------------------------- Physical Exam Details Patient Name: Date of Service: Bruce Donath [place], [name] 10/28/2023 7:45 A M Medical Record Number: [id number] Patient Account Number: [id number] Date of Birth/Sex: Treating RN: 1971/09/01 (52 y.o. M) Primary Care Provider: [name] Other Clinician: Referring Provider: Treating Provider/Extender: [name] in Treatment: 39 Constitutional . . . . no acute distress. Respiratory Normal work of breathing on room air.. Notes 10/28/2023: The wound measures smaller today. There is no tissue maceration. There is some slough on the surface and a little bit of callus accumulation around the edges. Electronic Signature(s) Signed: 10/28/2023 8:12:26 AM By: [name] MD FACS Entered By: [name] on 10/28/2023 08:12:26 -------------------------------------------------------------------------------- Physician Orders Details Patient Name: Date  of Service: Bruce Donath [place], [name] 10/28/2023 7:45 A M Medical Record Number: [id number] Patient Account Number: [id number] Date of Birth/Sex: Treating RN: 02/06/1971 (52 y.o. 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 - Dr. Lady Gary - room 2 Return A Bathing/ Shower/ Hygiene May shower with protection but do not get wound dressing(s) wet. Protect dressing(s) with water repellant cover (for example, large plastic SHELL, POPOVICH ([id number]) [id number] Page 5 of 12 bag) or a cast cover and may then take shower. Off-Loading Total Contact Cast to Left Lower Extremity - TCC size 3 heel cup to toes. foam pad to anterior ankle Removable cast walker boot to: - left foot Wound Treatment Wound #3 - Foot Wound Laterality: Plantar, Left Cleanser: Soap and Water 1 x Per Day/30 Days Discharge Instructions: May shower and wash wound  SHANTELL, DARKE ([id number]) [id number] Page 1 of 12 Visit Report for 10/28/2023 Chief Complaint Document Details Patient Name: Date of Service: Bruce Donath [place], [name] RO LD 10/28/2023 7:45 A M Medical Record Number: [id number] Patient Account Number: [id number] Date of Birth/Sex: Treating RN: 1971-01-22 (52 y.o. M) Primary Care Provider: [name] Other Clinician: Referring Provider: Treating Provider/Extender: [name] in Treatment: 84 Information Obtained from: Patient Chief Complaint 01/15/2023; left foot wound Electronic Signature(s) Signed: 10/28/2023 8:11:25 AM By: [name] MD FACS Entered By: [name] on 10/28/2023 08:11:25 -------------------------------------------------------------------------------- Debridement Details Patient Name: Date of Service: Bruce Donath [place], [name] 10/28/2023 7:45 A M Medical Record Number: [id number] Patient Account Number: [id number] Date of Birth/Sex: Treating RN: Jul 20, 1971 (52 y.o. [name] Primary Care Provider: [name] Other Clinician: Referring Provider: Treating Provider/Extender: [name] in Treatment: 39 Debridement Performed for Assessment: Wound #3 Left,Plantar Foot Performed By: Physician [name], MD The following information was scribed by: [name] The information was scribed for: [name] Debridement Type: Debridement Severity of Tissue Pre Debridement: Fat layer exposed Level of Consciousness (Pre-procedure): Awake and Alert Pre-procedure Verification/Time Out Yes - 08:04 Taken: Start Time: 08:04 Percent of Wound Bed Debrided: 100% T Area Debrided (cm): otal 0.09 Tissue and other material debrided: Non-Viable, Callus, Slough, Skin: Epidermis, Slough Level: Skin/Epidermis Debridement Description: Selective/Open Wound Instrument: Curette Bleeding: Minimum Hemostasis  Achieved: Pressure Response to Treatment: Procedure was tolerated well Level of Consciousness (Post- Awake and Alert procedure): Post Debridement Measurements of Total Wound Length: (cm) 0.4 Width: (cm) 0.3 Depth: (cm) 0.1 Volume: (cm) 0.009 Character of Wound/Ulcer Post Debridement: Improved Severity of Tissue Post Debridement: Fat layer exposed [name] ([id number]) [id number] Page 2 of 12 Post Procedure Diagnosis Same as Pre-procedure Electronic Signature(s) Signed: 10/28/2023 9:44:36 AM By: [name] MD FACS Signed: 10/28/2023 1:45:14 PM By: Gelene Mink By: [name] on 10/28/2023 08:07:11 -------------------------------------------------------------------------------- HPI Details Patient Name: Date of Service: Bruce Donath [place], [name] 10/28/2023 7:45 A M Medical Record Number: [id number] Patient Account Number: [id number] Date of Birth/Sex: Treating RN: May 13, 1971 (52 y.o. M) Primary Care Provider: [name] Other Clinician: Referring Provider: Treating Provider/Extender: [name] in Treatment: 39 History of Present Illness HPI Description: ADMISSION 01/11/2020 This is a 52 year old man with uncontrolled type 2 diabetes with a recent hemoglobin A1c earlier this year of 13.4. He is on insulin and glipizide. He does not take his blood sugars at home. He does have a follow-up with primary care later this month I believe on January 27. He tells [place] that roughly a month ago he was walking with a shoe with a hole in his foot. He took the shoe off and there was an open wound at roughly the left fourth met head. This has significant undermining and raised edges. He has not noticed any purulence he does not feel unwell. More recently he was taking skin off the bottom of his foot and has a superficial area on the left fifth met head. He has not been offloading this. The patient was in the  ER on 12/20. They gave him Bactroban which she has been using on the wound and 10 days worth of doxycycline. No x-rays were done. He has not had vascular studies. He is also been using hydrogen peroxide. Past medical history type 2 diabetes uncontrolled, chronic systolic heart failure, coronary artery disease with a history of congestive heart failure with stents. Hypertension hyperlipidemia and chronic  in 1 x Per Day/30 Days Discharge Instructions: Apply over primary dressing as directed. Secured With: [employer], 4.5x3.1 (in/yd) 1 x Per Day/30 Days Discharge Instructions: Secure with Kerlix as directed. 10/28/2023: The wound measures smaller today. There is no tissue maceration. There is some slough on the surface and a little bit of callus accumulation around the edges. DEMAREA, MAHONY ([id number]) [id number] Page 10 of 12 I used a curette to debride slough, callus, and a bit of skin from his wound. We will continue topical gentamicin and mupirocin with Prisma silver collagen. A total contact cast was applied after the wound was dressed. We are going to run his insurance for Epicord and Gra[employer] to see if either of these might be covered. Follow-up in 1 week. Electronic Signature(s) Signed: 10/28/2023 8:13:42 AM By: [name] MD FACS Entered By: [name] on 10/28/2023  08:13:42 -------------------------------------------------------------------------------- HxROS Details Patient Name: Date of Service: Bruce Donath [place], [name] 10/28/2023 7:45 A M Medical Record Number: [id number] Patient Account Number: [id number] Date of Birth/Sex: Treating RN: Oct 31, 1971 (52 y.o. M) Primary Care Provider: [name] Other Clinician: Referring Provider: Treating Provider/Extender: [name] in Treatment: 39 Information Obtained From Patient Eyes Medical History: Negative for: Cataracts; Glaucoma; Optic Neuritis Ear/Nose/Mouth/Throat Medical History: Positive for: Chronic sinus problems/congestion Negative for: Middle ear problems Hematologic/Lymphatic Medical History: Negative for: Anemia; Hemophilia; Human Immunodeficiency Virus; Lymphedema; Sickle Cell Disease Respiratory Medical History: Negative for: Aspiration; Asthma; Chronic Obstructive Pulmonary Disease (COPD); Pneumothorax; Sleep Apnea; Tuberculosis Cardiovascular Medical History: Positive for: Congestive Heart Failure; Coronary Artery Disease; Hypertension; Myocardial Infarction - age 41 Negative for: Angina; Arrhythmia; Deep Vein Thrombosis; Hypotension; Peripheral Arterial Disease; Peripheral Venous Disease; Phlebitis; Vasculitis Past Medical History Notes: Ischemic Cardiomyopathy Gastrointestinal Medical History: Negative for: Cirrhosis ; Colitis; Crohns; Hepatitis A; Hepatitis B; Hepatitis C Endocrine Medical History: Positive for: Type II Diabetes Time with diabetes: ten years Treated with: Insulin Blood sugar tested every day: No Genitourinary Medical History: Negative for: End Stage Renal Disease Past Medical History NotesBRYKEN, NEWEY ([id number]) [id number] Page 11 of 12 Renal Insufficiency Immunological Medical History: Negative for: Lupus Erythematosus; Raynauds; Scleroderma Integumentary (Skin) Medical  History: Negative for: History of Burn Musculoskeletal Medical History: Negative for: Gout; Rheumatoid Arthritis; Osteoarthritis; Osteomyelitis Neurologic Medical History: Positive for: Neuropathy Negative for: Dementia; Quadriplegia; Paraplegia; Seizure Disorder Oncologic Medical History: Negative for: Received Chemotherapy; Received Radiation Psychiatric Medical History: Negative for: Anorexia/bulimia; Confinement Anxiety HBO Extended History Items Ear/Nose/Mouth/Throat: Chronic sinus problems/congestion Immunizations Pneumococcal Vaccine: Received Pneumococcal Vaccination: No Implantable Devices None Hospitalization / Surgery History Type of Hospitalization/Surgery Heart Cath in 2018 Family and Social History Cancer: No; Diabetes: No; Heart Disease: Yes - Mother,Father; Hereditary Spherocytosis: No; Hypertension: No; Kidney Disease: No; Lung Disease: No; Seizures: No; Stroke: No; Thyroid Problems: No; Tuberculosis: No; Never smoker; Marital Status - Single; Alcohol Use: Rarely; Drug Use: No History; Caffeine Use: Rarely; Financial Concerns: No; Food, Clothing or Shelter Needs: No; Support System Lacking: No; Transportation Concerns: No Electronic Signature(s) Signed: 10/28/2023 9:44:36 AM By: [name] MD FACS Entered By: [name] on 10/28/2023 08:12:05 -------------------------------------------------------------------------------- Total Contact Cast Details Patient Name: Date of Service: Bruce Donath [place], [name] 10/28/2023 7:45 A M Medical Record Number: [id number] Patient Account Number: [id number] Date of Birth/Sex: Treating RN: 05/31/1971 (52 y.o. [name] Primary Care Provider: [name] Other Clinician: Referring Provider: Treating Provider/Extender: [name] in Treatment: 12 T Contact Cast Applied for Wound Assessment: otal Wound #3 Left,Plantar Foot  SHANTELL, DARKE ([id number]) [id number] Page 1 of 12 Visit Report for 10/28/2023 Chief Complaint Document Details Patient Name: Date of Service: Bruce Donath [place], [name] RO LD 10/28/2023 7:45 A M Medical Record Number: [id number] Patient Account Number: [id number] Date of Birth/Sex: Treating RN: 1971-01-22 (52 y.o. M) Primary Care Provider: [name] Other Clinician: Referring Provider: Treating Provider/Extender: [name] in Treatment: 84 Information Obtained from: Patient Chief Complaint 01/15/2023; left foot wound Electronic Signature(s) Signed: 10/28/2023 8:11:25 AM By: [name] MD FACS Entered By: [name] on 10/28/2023 08:11:25 -------------------------------------------------------------------------------- Debridement Details Patient Name: Date of Service: Bruce Donath [place], [name] 10/28/2023 7:45 A M Medical Record Number: [id number] Patient Account Number: [id number] Date of Birth/Sex: Treating RN: Jul 20, 1971 (52 y.o. [name] Primary Care Provider: [name] Other Clinician: Referring Provider: Treating Provider/Extender: [name] in Treatment: 39 Debridement Performed for Assessment: Wound #3 Left,Plantar Foot Performed By: Physician [name], MD The following information was scribed by: [name] The information was scribed for: [name] Debridement Type: Debridement Severity of Tissue Pre Debridement: Fat layer exposed Level of Consciousness (Pre-procedure): Awake and Alert Pre-procedure Verification/Time Out Yes - 08:04 Taken: Start Time: 08:04 Percent of Wound Bed Debrided: 100% T Area Debrided (cm): otal 0.09 Tissue and other material debrided: Non-Viable, Callus, Slough, Skin: Epidermis, Slough Level: Skin/Epidermis Debridement Description: Selective/Open Wound Instrument: Curette Bleeding: Minimum Hemostasis  Achieved: Pressure Response to Treatment: Procedure was tolerated well Level of Consciousness (Post- Awake and Alert procedure): Post Debridement Measurements of Total Wound Length: (cm) 0.4 Width: (cm) 0.3 Depth: (cm) 0.1 Volume: (cm) 0.009 Character of Wound/Ulcer Post Debridement: Improved Severity of Tissue Post Debridement: Fat layer exposed [name] ([id number]) [id number] Page 2 of 12 Post Procedure Diagnosis Same as Pre-procedure Electronic Signature(s) Signed: 10/28/2023 9:44:36 AM By: [name] MD FACS Signed: 10/28/2023 1:45:14 PM By: Gelene Mink By: [name] on 10/28/2023 08:07:11 -------------------------------------------------------------------------------- HPI Details Patient Name: Date of Service: Bruce [place]ath [place], [name] 10/28/2023 7:45 A M Medical Record Number: [id number] Patient Account Number: [id number] Date of Birth/Sex: Treating RN: May 13, 1971 (52 y.o. M) Primary Care Provider: [name] Other Clinician: Referring Provider: Treating Provider/Extender: [name] in Treatment: 39 History of Present Illness HPI Description: ADMISSION 01/11/2020 This is a 52 year old man with uncontrolled type 2 diabetes with a recent hemoglobin A1c earlier this year of 13.4. He is on insulin and glipizide. He does not take his blood sugars at home. He does have a follow-up with primary care later this month I believe on January 27. He tells [place] that roughly a month ago he was walking with a shoe with a hole in his foot. He took the shoe off and there was an open wound at roughly the left fourth met head. This has significant undermining and raised edges. He has not noticed any purulence he does not feel unwell. More recently he was taking skin off the bottom of his foot and has a superficial area on the left fifth met head. He has not been offloading this. The patient was in the  ER on 12/20. They gave him Bactroban which she has been using on the wound and 10 days worth of doxycycline. No x-rays were done. He has not had vascular studies. He is also been using hydrogen peroxide. Past medical history type 2 diabetes uncontrolled, chronic systolic heart failure, coronary artery disease with a history of congestive heart failure with stents. Hypertension hyperlipidemia and chronic  RHETT, DELHOYO ([id number])  [id number] Page 12 of 12 Performed By: Physician [name], MD The following information was scribed by: [name] The information was scribed for: [name] Post Procedure Diagnosis Same as Pre-procedure Electronic Signature(s) Signed: 10/28/2023 9:44:36 AM By: [name] MD FACS Signed: 10/28/2023 1:45:14 PM By: Gelene Mink By: [name] on 10/28/2023 08:04:23 -------------------------------------------------------------------------------- SuperBill Details Patient Name: Date of Service: Bruce Donath [place], [name] 10/28/2023 Medical Record Number: [id number] Patient Account Number: [id number] Date of Birth/Sex: Treating RN: [birth year] (52 y.o. 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) Facility Procedures : CPT4 Code: 87564332 Description: 97597 - DEBRIDE WOUND 1ST 20 SQ CM OR < ICD-10 Diagnosis Description L97.522 Non-pressure chronic ulcer of other part of left foot with fat layer exposed Modifier: Quantity: 1 Physician Procedures : CPT4 Code Description Modifier [id number] 99214 - WC PHYS LEVEL 4 - EST PT ICD-10 Diagnosis Description L97.522 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.621 Type 2 diabetes mellitus with foot ulcer Z89.422 Acquired  absence of other left toe(s) Quantity: 1 : [id number] 97597 - WC PHYS DEBR WO ANESTH 20 SQ CM ICD-10 Diagnosis Description L97.522 Non-pressure chronic ulcer of other part of left foot with fat layer exposed Quantity: 1 Electronic Signature(s) Signed: 10/28/2023 8:13:55 AM By: [name] MD FACS Entered By: [name] on 10/28/2023 08:13:55  RHETT, DELHOYO ([id number])  [id number] Page 12 of 12 Performed By: Physician [name], MD The following information was scribed by: [name] The information was scribed for: [name] Post Procedure Diagnosis Same as Pre-procedure Electronic Signature(s) Signed: 10/28/2023 9:44:36 AM By: [name] MD FACS Signed: 10/28/2023 1:45:14 PM By: Gelene Mink By: [name] on 10/28/2023 08:04:23 -------------------------------------------------------------------------------- SuperBill Details Patient Name: Date of Service: Bruce [place]ath [place], [name] 10/28/2023 Medical Record Number: [id number] Patient Account Number: [id number] Date of Birth/Sex: Treating RN: [birth year] (52 y.o. 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) Facility Procedures : CPT4 Code: 87564332 Description: 97597 - DEBRIDE WOUND 1ST 20 SQ CM OR < ICD-10 Diagnosis Description L97.522 Non-pressure chronic ulcer of other part of left foot with fat layer exposed Modifier: Quantity: 1 Physician Procedures : CPT4 Code Description Modifier [id number] 99214 - WC PHYS LEVEL 4 - EST PT ICD-10 Diagnosis Description L97.522 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.621 Type 2 diabetes mellitus with foot ulcer Z89.422 Acquired  absence of other left toe(s) Quantity: 1 : [id number] 97597 - WC PHYS DEBR WO ANESTH 20 SQ CM ICD-10 Diagnosis Description L97.522 Non-pressure chronic ulcer of other part of left foot with fat layer exposed Quantity: 1 Electronic Signature(s) Signed: 10/28/2023 8:13:55 AM By: [name] MD FACS Entered By: [name] on 10/28/2023 08:13:55  in 1 x Per Day/30 Days Discharge Instructions: Apply over primary dressing as directed. Secured With: [employer], 4.5x3.1 (in/yd) 1 x Per Day/30 Days Discharge Instructions: Secure with Kerlix as directed. 10/28/2023: The wound measures smaller today. There is no tissue maceration. There is some slough on the surface and a little bit of callus accumulation around the edges. DEMAREA, MAHONY ([id number]) [id number] Page 10 of 12 I used a curette to debride slough, callus, and a bit of skin from his wound. We will continue topical gentamicin and mupirocin with Prisma silver collagen. A total contact cast was applied after the wound was dressed. We are going to run his insurance for [employer] and [employer] to see if either of these might be covered. Follow-up in 1 week. Electronic Signature(s) Signed: 10/28/2023 8:13:42 AM By: [name] MD FACS Entered By: [name] on 10/28/2023  08:13:42 -------------------------------------------------------------------------------- HxROS Details Patient Name: Date of Service: Bruce Donath [place], [name] 10/28/2023 7:45 A M Medical Record Number: [id number] Patient Account Number: [id number] Date of Birth/Sex: Treating RN: Oct 31, 1971 (52 y.o. M) Primary Care Provider: [name] Other Clinician: Referring Provider: Treating Provider/Extender: [name] in Treatment: 39 Information Obtained From Patient Eyes Medical History: Negative for: Cataracts; Glaucoma; Optic Neuritis Ear/Nose/Mouth/Throat Medical History: Positive for: Chronic sinus problems/congestion Negative for: Middle ear problems Hematologic/Lymphatic Medical History: Negative for: Anemia; Hemophilia; Human Immunodeficiency Virus; Lymphedema; Sickle Cell Disease Respiratory Medical History: Negative for: Aspiration; Asthma; Chronic Obstructive Pulmonary Disease (COPD); Pneumothorax; Sleep Apnea; Tuberculosis Cardiovascular Medical History: Positive for: Congestive Heart Failure; Coronary Artery Disease; Hypertension; Myocardial Infarction - age 41 Negative for: Angina; Arrhythmia; Deep Vein Thrombosis; Hypotension; Peripheral Arterial Disease; Peripheral Venous Disease; Phlebitis; Vasculitis Past Medical History Notes: Ischemic Cardiomyopathy Gastrointestinal Medical History: Negative for: Cirrhosis ; Colitis; Crohns; Hepatitis A; Hepatitis B; Hepatitis C Endocrine Medical History: Positive for: Type II Diabetes Time with diabetes: ten years Treated with: Insulin Blood sugar tested every day: No Genitourinary Medical History: Negative for: End Stage Renal Disease Past Medical History NotesBRYKEN, NEWEY ([id number]) [id number] Page 11 of 12 Renal Insufficiency Immunological Medical History: Negative for: Lupus Erythematosus; Raynauds; Scleroderma Integumentary (Skin) Medical  History: Negative for: History of Burn Musculoskeletal Medical History: Negative for: Gout; Rheumatoid Arthritis; Osteoarthritis; Osteomyelitis Neurologic Medical History: Positive for: Neuropathy Negative for: Dementia; Quadriplegia; Paraplegia; Seizure Disorder Oncologic Medical History: Negative for: Received Chemotherapy; Received Radiation Psychiatric Medical History: Negative for: Anorexia/bulimia; Confinement Anxiety HBO Extended History Items Ear/Nose/Mouth/Throat: Chronic sinus problems/congestion Immunizations Pneumococcal Vaccine: Received Pneumococcal Vaccination: No Implantable Devices None Hospitalization / Surgery History Type of Hospitalization/Surgery Heart Cath in 2018 Family and Social History Cancer: No; Diabetes: No; Heart Disease: Yes - Mother,Father; Hereditary Spherocytosis: No; Hypertension: No; Kidney Disease: No; Lung Disease: No; Seizures: No; Stroke: No; Thyroid Problems: No; Tuberculosis: No; Never smoker; Marital Status - Single; Alcohol Use: Rarely; Drug Use: No History; Caffeine Use: Rarely; Financial Concerns: No; Food, Clothing or Shelter Needs: No; Support System Lacking: No; Transportation Concerns: No Electronic Signature(s) Signed: 10/28/2023 9:44:36 AM By: [name] MD FACS Entered By: [name] on 10/28/2023 08:12:05 -------------------------------------------------------------------------------- Total Contact Cast Details Patient Name: Date of Service: Bruce Donath [place], [name] 10/28/2023 7:45 A M Medical Record Number: [id number] Patient Account Number: [id number] Date of Birth/Sex: Treating RN: 05/31/1971 (52 y.o. [name] Primary Care Provider: [name] Other Clinician: Referring Provider: Treating Provider/Extender: [name] in Treatment: 12 T Contact Cast Applied for Wound Assessment: otal Wound #3 Left,Plantar Foot  was seen earlier this week and plan was for a total contact cast but there is none available. The cast finally arrived in patient has come in today to have this placed. We will continue with Hydrofera Blue and antibiotic ointment under the cast. 9/12; patient presents for follow-up. We have been using Hydrofera Blue and antibiotic ointment under the cast. Wound is stable. He denies signs of infection. 09/17/2023: The wound is measuring smaller today. The surface appears healthy and there is minimal accumulation of slough. 09/23/2023: The wound external measurements remain about the same, but it is shallower and visually appears smaller. 09/30/2023: The wound is covered  with a layer of callus. Underneath the callus, there is a tiny superficial residual opening. 10/14/2023: He returns after being away for 2 weeks, during which he was out of a cast. Not surprisingly, his wound has reopened substantially. It remains clean, however. 10/21/2023: We have recovered most of what was lost as far as progress with his absence from clinic. The wound did measure smaller today. It is a little bit ULYSS, CLENDENIN ([id number]) [id number] Page 4 of 12 macerated around the edges and he reports that he thinks some water got in over the top of his cast while showering. 10/28/2023: The wound measures smaller today. There is no tissue maceration. There is some slough on the surface and a little bit of callus accumulation around the edges. Electronic Signature(s) Signed: 10/28/2023 8:11:59 AM By: [name] MD FACS Entered By: [name] on 10/28/2023 08:11:59 -------------------------------------------------------------------------------- Physical Exam Details Patient Name: Date of Service: Bruce Donath [place], [name] 10/28/2023 7:45 A M Medical Record Number: [id number] Patient Account Number: [id number] Date of Birth/Sex: Treating RN: 1971/09/01 (52 y.o. M) Primary Care Provider: [name] Other Clinician: Referring Provider: Treating Provider/Extender: [name] in Treatment: 39 Constitutional . . . . no acute distress. Respiratory Normal work of breathing on room air.. Notes 10/28/2023: The wound measures smaller today. There is no tissue maceration. There is some slough on the surface and a little bit of callus accumulation around the edges. Electronic Signature(s) Signed: 10/28/2023 8:12:26 AM By: [name] MD FACS Entered By: [name] on 10/28/2023 08:12:26 -------------------------------------------------------------------------------- Physician Orders Details Patient Name: Date  of Service: Bruce Donath [place], [name] 10/28/2023 7:45 A M Medical Record Number: [id number] Patient Account Number: [id number] Date of Birth/Sex: Treating RN: 02/06/1971 (52 y.o. 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 - Dr. Lady Gary - room 2 Return A Bathing/ Shower/ Hygiene May shower with protection but do not get wound dressing(s) wet. Protect dressing(s) with water repellant cover (for example, large plastic SHELL, POPOVICH ([id number]) [id number] Page 5 of 12 bag) or a cast cover and may then take shower. Off-Loading Total Contact Cast to Left Lower Extremity - TCC size 3 heel cup to toes. foam pad to anterior ankle Removable cast walker boot to: - left foot Wound Treatment Wound #3 - Foot Wound Laterality: Plantar, Left Cleanser: Soap and Water 1 x Per Day/30 Days Discharge Instructions: May shower and wash wound  SHANTELL, DARKE ([id number]) [id number] Page 1 of 12 Visit Report for 10/28/2023 Chief Complaint Document Details Patient Name: Date of Service: Bruce Donath [place], [name] RO LD 10/28/2023 7:45 A M Medical Record Number: [id number] Patient Account Number: [id number] Date of Birth/Sex: Treating RN: 1971-01-22 (52 y.o. M) Primary Care Provider: [name] Other Clinician: Referring Provider: Treating Provider/Extender: [name] in Treatment: 84 Information Obtained from: Patient Chief Complaint 01/15/2023; left foot wound Electronic Signature(s) Signed: 10/28/2023 8:11:25 AM By: [name] MD FACS Entered By: [name] on 10/28/2023 08:11:25 -------------------------------------------------------------------------------- Debridement Details Patient Name: Date of Service: Bruce Donath [place], [name] 10/28/2023 7:45 A M Medical Record Number: [id number] Patient Account Number: [id number] Date of Birth/Sex: Treating RN: Jul 20, 1971 (52 y.o. [name] Primary Care Provider: [name] Other Clinician: Referring Provider: Treating Provider/Extender: [name] in Treatment: 39 Debridement Performed for Assessment: Wound #3 Left,Plantar Foot Performed By: Physician [name], MD The following information was scribed by: [name] The information was scribed for: [name] Debridement Type: Debridement Severity of Tissue Pre Debridement: Fat layer exposed Level of Consciousness (Pre-procedure): Awake and Alert Pre-procedure Verification/Time Out Yes - 08:04 Taken: Start Time: 08:04 Percent of Wound Bed Debrided: 100% T Area Debrided (cm): otal 0.09 Tissue and other material debrided: Non-Viable, Callus, Slough, Skin: Epidermis, Slough Level: Skin/Epidermis Debridement Description: Selective/Open Wound Instrument: Curette Bleeding: Minimum Hemostasis  Achieved: Pressure Response to Treatment: Procedure was tolerated well Level of Consciousness (Post- Awake and Alert procedure): Post Debridement Measurements of Total Wound Length: (cm) 0.4 Width: (cm) 0.3 Depth: (cm) 0.1 Volume: (cm) 0.009 Character of Wound/Ulcer Post Debridement: Improved Severity of Tissue Post Debridement: Fat layer exposed [name] ([id number]) [id number] Page 2 of 12 Post Procedure Diagnosis Same as Pre-procedure Electronic Signature(s) Signed: 10/28/2023 9:44:36 AM By: [name] MD FACS Signed: 10/28/2023 1:45:14 PM By: Gelene Mink By: [name] on 10/28/2023 08:07:11 -------------------------------------------------------------------------------- HPI Details Patient Name: Date of Service: Bruce Donath [place], [name] 10/28/2023 7:45 A M Medical Record Number: [id number] Patient Account Number: [id number] Date of Birth/Sex: Treating RN: May 13, 1971 (52 y.o. M) Primary Care Provider: [name] Other Clinician: Referring Provider: Treating Provider/Extender: [name] in Treatment: 39 History of Present Illness HPI Description: ADMISSION 01/11/2020 This is a 52 year old man with uncontrolled type 2 diabetes with a recent hemoglobin A1c earlier this year of 13.4. He is on insulin and glipizide. He does not take his blood sugars at home. He does have a follow-up with primary care later this month I believe on January 27. He tells [place] that roughly a month ago he was walking with a shoe with a hole in his foot. He took the shoe off and there was an open wound at roughly the left fourth met head. This has significant undermining and raised edges. He has not noticed any purulence he does not feel unwell. More recently he was taking skin off the bottom of his foot and has a superficial area on the left fifth met head. He has not been offloading this. The patient was in the  ER on 12/20. They gave him Bactroban which she has been using on the wound and 10 days worth of doxycycline. No x-rays were done. He has not had vascular studies. He is also been using hydrogen peroxide. Past medical history type 2 diabetes uncontrolled, chronic systolic heart failure, coronary artery disease with a history of congestive heart failure with stents. Hypertension hyperlipidemia and chronic  RHETT, DELHOYO ([id number])  [id number] Page 12 of 12 Performed By: Physician [name], MD The following information was scribed by: [name] The information was scribed for: [name] Post Procedure Diagnosis Same as Pre-procedure Electronic Signature(s) Signed: 10/28/2023 9:44:36 AM By: [name] MD FACS Signed: 10/28/2023 1:45:14 PM By: Gelene Mink By: [name] on 10/28/2023 08:04:23 -------------------------------------------------------------------------------- SuperBill Details Patient Name: Date of Service: Bruce Donath [place], [name] 10/28/2023 Medical Record Number: [id number] Patient Account Number: [id number] Date of Birth/Sex: Treating RN: [birth year] (52 y.o. 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) Facility Procedures : CPT4 Code: [id number] Description: 97597 - DEBRIDE WOUND 1ST 20 SQ CM OR < ICD-10 Diagnosis Description L97.522 Non-pressure chronic ulcer of other part of left foot with fat layer exposed Modifier: Quantity: 1 Physician Procedures : CPT4 Code Description Modifier [id number] 99214 - WC PHYS LEVEL 4 - EST PT ICD-10 Diagnosis Description L97.522 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.621 Type 2 diabetes mellitus with foot ulcer Z89.422 Acquired  absence of other left toe(s) Quantity: 1 : [id number] 97597 - WC PHYS DEBR WO ANESTH 20 SQ CM ICD-10 Diagnosis Description L97.522 Non-pressure chronic ulcer of other part of left foot with fat layer exposed Quantity: 1 Electronic Signature(s) Signed: 10/28/2023 8:13:55 AM By: [name] MD FACS Entered By: [name] on 10/28/2023 08:13:55  with dial antibacterial soap and water prior to dressing change. Cleanser: Vashe 5.8 (oz) 1 x Per Day/30 Days Discharge Instructions: Cleanse the wound with Vashe prior to applying a clean dressing using gauze sponges, not tissue or cotton balls. Peri-Wound Care: Zinc Oxide Ointment 30g tube 1 x Per Day/30 Days Discharge Instructions: Apply Zinc Oxide to periwound with each dressing change Topical: Gentamicin 1 x Per Day/30 Days Discharge Instructions: As directed by physician Topical: Mupirocin  Ointment 1 x Per Day/30 Days Discharge Instructions: Apply Mupirocin (Bactroban) as instructed Topical: Skintegrity Hydrogel 4 (oz) 1 x Per Day/30 Days Discharge Instructions: Apply hydrogel as directed Prim Dressing: Promogran Prisma Matrix, 4.34 (sq in) (silver collagen) 1 x Per Day/30 Days ary Discharge Instructions: Moisten collagen with saline or hydrogel Secondary Dressing: Woven Gauze Sponge, Non-Sterile 4x4 in 1 x Per Day/30 Days Discharge Instructions: Apply over primary dressing as directed. Secondary Dressing: Zetuvit Plus 4x8 in 1 x Per Day/30 Days Discharge Instructions: Apply over primary dressing as directed. Secured With: [employer], 4.5x3.1 (in/yd) 1 x Per Day/30 Days Discharge Instructions: Secure with Kerlix as directed. Electronic Signature(s) Signed: 10/28/2023 9:44:36 AM By: [name] MD FACS Entered By: [name] on 10/28/2023 08:13:01 -------------------------------------------------------------------------------- Problem List Details Patient Name: Date of Service: Bruce Donath [place], [name] 10/28/2023 7:45 A M Medical Record Number: [id number] Patient Account Number: [id number] Date of Birth/Sex: Treating RN: 05-May-1971 (52 y.o. M) Primary Care Provider: [name] Other Clinician: Referring Provider: Treating Provider/Extender: [name] in Treatment: 39 Active Problems ICD-10 Encounter Code Description Active Date MDM Diagnosis [phone] Non-pressure chronic ulcer of other part of left foot with fat layer exposed 01/24/2023 No Yes E11.621 Type 2 diabetes mellitus with foot ulcer 01/24/2023 No Yes ARBA, WOOTAN ([id number]) [id number] Page 6 of 12 [phone] Acquired absence of other left toe(s) 01/24/2023 No Yes Inactive Problems Resolved Problems Electronic Signature(s) Signed: 10/28/2023 8:11:10 AM By: [name] MD FACS Entered By: [name] on 10/28/2023  08:11:10 -------------------------------------------------------------------------------- Progress Note Details Patient Name: Date of Service: Bruce Donath [place], [name] 10/28/2023 7:45 A M Medical Record Number: [id number] Patient Account Number: [id number] Date of Birth/Sex: Treating RN: [birth year] (52 y.o. M) Primary Care Provider: [name] Other Clinician: Referring Provider: Treating Provider/Extender: [name] in Treatment: 39 Subjective Chief Complaint Information obtained from Patient 01/15/2023; left foot wound History of Present Illness (HPI) ADMISSION 01/11/2020 This is a 52 year old man with uncontrolled type 2 diabetes with a recent hemoglobin A1c earlier this year of 13.4. He is on insulin and glipizide. He does not take his blood sugars at home. He does have a follow-up with primary care later this month I believe on January 27. He tells [place] that roughly a month ago he was walking with a shoe with a hole in his foot. He took the shoe off and there was an open wound at roughly the left fourth met head. This has significant undermining and raised edges. He has not noticed any purulence he does not feel unwell. More recently he was taking skin off the bottom of his foot and has a superficial area on the left fifth met head. He has not been offloading this. The patient was in the ER on 12/20. They gave him Bactroban which she has been using on the wound and 10 days worth of doxycycline. No x-rays were done. He has not had vascular studies. He is also been using hydrogen peroxide. Past medical history type 2  SHANTELL, DARKE ([id number]) [id number] Page 1 of 12 Visit Report for 10/28/2023 Chief Complaint Document Details Patient Name: Date of Service: Bruce Donath [place], [name] RO LD 10/28/2023 7:45 A M Medical Record Number: [id number] Patient Account Number: [id number] Date of Birth/Sex: Treating RN: 1971-01-22 (52 y.o. M) Primary Care Provider: [name] Other Clinician: Referring Provider: Treating Provider/Extender: [name] in Treatment: 84 Information Obtained from: Patient Chief Complaint 01/15/2023; left foot wound Electronic Signature(s) Signed: 10/28/2023 8:11:25 AM By: [name] MD FACS Entered By: [name] on 10/28/2023 08:11:25 -------------------------------------------------------------------------------- Debridement Details Patient Name: Date of Service: Bruce Donath [place], [name] 10/28/2023 7:45 A M Medical Record Number: [id number] Patient Account Number: [id number] Date of Birth/Sex: Treating RN: Jul 20, 1971 (52 y.o. [name] Primary Care Provider: [name] Other Clinician: Referring Provider: Treating Provider/Extender: [name] in Treatment: 39 Debridement Performed for Assessment: Wound #3 Left,Plantar Foot Performed By: Physician [name], MD The following information was scribed by: [name] The information was scribed for: [name] Debridement Type: Debridement Severity of Tissue Pre Debridement: Fat layer exposed Level of Consciousness (Pre-procedure): Awake and Alert Pre-procedure Verification/Time Out Yes - 08:04 Taken: Start Time: 08:04 Percent of Wound Bed Debrided: 100% T Area Debrided (cm): otal 0.09 Tissue and other material debrided: Non-Viable, Callus, Slough, Skin: Epidermis, Slough Level: Skin/Epidermis Debridement Description: Selective/Open Wound Instrument: Curette Bleeding: Minimum Hemostasis  Achieved: Pressure Response to Treatment: Procedure was tolerated well Level of Consciousness (Post- Awake and Alert procedure): Post Debridement Measurements of Total Wound Length: (cm) 0.4 Width: (cm) 0.3 Depth: (cm) 0.1 Volume: (cm) 0.009 Character of Wound/Ulcer Post Debridement: Improved Severity of Tissue Post Debridement: Fat layer exposed [name] ([id number]) [id number] Page 2 of 12 Post Procedure Diagnosis Same as Pre-procedure Electronic Signature(s) Signed: 10/28/2023 9:44:36 AM By: [name] MD FACS Signed: 10/28/2023 1:45:14 PM By: Gelene Mink By: [name] on 10/28/2023 08:07:11 -------------------------------------------------------------------------------- HPI Details Patient Name: Date of Service: Bruce Donath [place], [name] 10/28/2023 7:45 A M Medical Record Number: [id number] Patient Account Number: [id number] Date of Birth/Sex: Treating RN: May 13, 1971 (52 y.o. M) Primary Care Provider: [name] Other Clinician: Referring Provider: Treating Provider/Extender: [name] in Treatment: 39 History of Present Illness HPI Description: ADMISSION 01/11/2020 This is a 52 year old man with uncontrolled type 2 diabetes with a recent hemoglobin A1c earlier this year of 13.4. He is on insulin and glipizide. He does not take his blood sugars at home. He does have a follow-up with primary care later this month I believe on January 27. He tells [place] that roughly a month ago he was walking with a shoe with a hole in his foot. He took the shoe off and there was an open wound at roughly the left fourth met head. This has significant undermining and raised edges. He has not noticed any purulence he does not feel unwell. More recently he was taking skin off the bottom of his foot and has a superficial area on the left fifth met head. He has not been offloading this. The patient was in the  ER on 12/20. They gave him Bactroban which she has been using on the wound and 10 days worth of doxycycline. No x-rays were done. He has not had vascular studies. He is also been using hydrogen peroxide. Past medical history type 2 diabetes uncontrolled, chronic systolic heart failure, coronary artery disease with a history of congestive heart failure with stents. Hypertension hyperlipidemia and chronic  SHANTELL, DARKE ([id number]) [id number] Page 1 of 12 Visit Report for 10/28/2023 Chief Complaint Document Details Patient Name: Date of Service: Bruce Donath [place], [name] RO LD 10/28/2023 7:45 A M Medical Record Number: [id number] Patient Account Number: [id number] Date of Birth/Sex: Treating RN: 1971-01-22 (52 y.o. M) Primary Care Provider: [name] Other Clinician: Referring Provider: Treating Provider/Extender: [name] in Treatment: 84 Information Obtained from: Patient Chief Complaint 01/15/2023; left foot wound Electronic Signature(s) Signed: 10/28/2023 8:11:25 AM By: [name] MD FACS Entered By: [name] on 10/28/2023 08:11:25 -------------------------------------------------------------------------------- Debridement Details Patient Name: Date of Service: Bruce Donath [place], [name] 10/28/2023 7:45 A M Medical Record Number: [id number] Patient Account Number: [id number] Date of Birth/Sex: Treating RN: Jul 20, 1971 (52 y.o. [name] Primary Care Provider: [name] Other Clinician: Referring Provider: Treating Provider/Extender: [name] in Treatment: 39 Debridement Performed for Assessment: Wound #3 Left,Plantar Foot Performed By: Physician [name], MD The following information was scribed by: [name] The information was scribed for: [name] Debridement Type: Debridement Severity of Tissue Pre Debridement: Fat layer exposed Level of Consciousness (Pre-procedure): Awake and Alert Pre-procedure Verification/Time Out Yes - 08:04 Taken: Start Time: 08:04 Percent of Wound Bed Debrided: 100% T Area Debrided (cm): otal 0.09 Tissue and other material debrided: Non-Viable, Callus, Slough, Skin: Epidermis, Slough Level: Skin/Epidermis Debridement Description: Selective/Open Wound Instrument: Curette Bleeding: Minimum Hemostasis  Achieved: Pressure Response to Treatment: Procedure was tolerated well Level of Consciousness (Post- Awake and Alert procedure): Post Debridement Measurements of Total Wound Length: (cm) 0.4 Width: (cm) 0.3 Depth: (cm) 0.1 Volume: (cm) 0.009 Character of Wound/Ulcer Post Debridement: Improved Severity of Tissue Post Debridement: Fat layer exposed [name] ([id number]) [id number] Page 2 of 12 Post Procedure Diagnosis Same as Pre-procedure Electronic Signature(s) Signed: 10/28/2023 9:44:36 AM By: [name] MD FACS Signed: 10/28/2023 1:45:14 PM By: Gelene Mink By: [name] on 10/28/2023 08:07:11 -------------------------------------------------------------------------------- HPI Details Patient Name: Date of Service: Bruce Donath [place], [name] 10/28/2023 7:45 A M Medical Record Number: [id number] Patient Account Number: [id number] Date of Birth/Sex: Treating RN: May 13, 1971 (52 y.o. M) Primary Care Provider: [name] Other Clinician: Referring Provider: Treating Provider/Extender: [name] in Treatment: 39 History of Present Illness HPI Description: ADMISSION 01/11/2020 This is a 52 year old man with uncontrolled type 2 diabetes with a recent hemoglobin A1c earlier this year of 13.4. He is on insulin and glipizide. He does not take his blood sugars at home. He does have a follow-up with primary care later this month I believe on January 27. He tells [place] that roughly a month ago he was walking with a shoe with a hole in his foot. He took the shoe off and there was an open wound at roughly the left fourth met head. This has significant undermining and raised edges. He has not noticed any purulence he does not feel unwell. More recently he was taking skin off the bottom of his foot and has a superficial area on the left fifth met head. He has not been offloading this. The patient was in the  ER on 12/20. They gave him Bactroban which she has been using on the wound and 10 days worth of doxycycline. No x-rays were done. He has not had vascular studies. He is also been using hydrogen peroxide. Past medical history type 2 diabetes uncontrolled, chronic systolic heart failure, coronary artery disease with a history of congestive heart failure with stents. Hypertension hyperlipidemia and chronic

## 2023-10-28 NOTE — Progress Notes (Signed)
Coronary Artery Disease, Hypertension, Myocardial Infarction, Type II Diabetes, Neuropathy 10/01/2022 N/A N/A Date Acquired: 44 N/A N/A Weeks of Treatment: Open N/A N/A Wound Status: No N/A N/A Wound Recurrence: Yes N/A N/A Pending [name] on Presentation: 0.4x0.3x0.1 N/A N/A Measurements L x W x D (cm) 0.094 N/A N/A A (cm) : rea 0.009 N/A N/A Volume (cm) : 97.60% N/A N/A % Reduction in A rea: 98.90% N/A N/A % Reduction in Volume: Grade 1 N/A N/A Classification: Medium N/A N/A Exudate A mount: Serosanguineous N/A N/A Exudate Type: red, brown N/A N/A Exudate Color: Thickened N/A N/A Wound Margin: Large (67-100%) N/A N/A Granulation A mount: Red, Pink N/A N/A Granulation Quality: Small (1-33%) N/A N/A Necrotic A mount: Fat Layer (Subcutaneous Tissue): Yes N/A N/A Exposed Structures: Fascia: No Tendon: No Muscle: No Joint: No Bone: No Medium (34-66%) N/A N/A Epithelialization: Debridement - Selective/Open Wound N/A N/A Debridement: Pre-procedure  Verification/Time Out 08:04 N/A N/A Taken: Callus, Slough N/A N/A Tissue Debrided: Skin/Epidermis N/A N/A Level: 0.09 N/A N/A Debridement A (sq cm): rea Curette N/A N/A Instrument: Minimum N/A N/A Bleeding: Pressure N/A N/A Hemostasis A chieved: Procedure was tolerated well N/A N/A Debridement Treatment Response: 0.4x0.3x0.1 N/A N/A Post Debridement Measurements L x W x D (cm) 0.009 N/A N/A Post Debridement Volume: (cm) Callus: Yes N/A N/A Periwound Skin Texture: Excoriation: No Induration: No Crepitus: No Rash: No Scarring: No Maceration: Yes N/A N/A Periwound Skin Moisture: Dry/Scaly: No Atrophie Blanche: No N/A N/A Periwound Skin Color: Cyanosis: No Ecchymosis: No Erythema: No Hemosiderin Staining: No Mottled: No Pallor: No Rubor: No No Abnormality N/A N/A Temperature: Debridement N/A N/A Procedures Performed: T Contact Cast otal Treatment Notes Wound #3 (Foot) Wound Laterality: Plantar, Left Cleanser Soap and Water Discharge Instruction: May shower and wash wound with dial antibacterial soap and water prior to dressing change. Vashe 5.8 (oz) Discharge Instruction: Cleanse the wound with Vashe prior to applying a clean dressing using gauze sponges, not tissue or cotton balls. Peri-Wound Care RAYFIELD, LAFLEUR ([id number]) [id number] Page 4 of 8 Zinc Oxide Ointment 30g tube Discharge Instruction: Apply Zinc Oxide to periwound with each dressing change Topical Gentamicin Discharge Instruction: As directed by physician Mupirocin Ointment Discharge Instruction: Apply Mupirocin (Bactroban) as instructed Skintegrity Hydrogel 4 (oz) Discharge Instruction: Apply hydrogel as directed Primary Dressing Promogran Prisma Matrix, 4.34 (sq in) (silver collagen) Discharge Instruction: Moisten collagen with saline or hydrogel Secondary Dressing Woven Gauze Sponge, Non-Sterile 4x4 in Discharge Instruction: Apply over primary dressing as  directed. Zetuvit Plus 4x8 in Discharge Instruction: Apply over primary dressing as directed. Secured With [employer], 4.5x3.1 (in/yd) Discharge Instruction: Secure with Kerlix as directed. Compression Wrap Compression Stockings Add-Ons Electronic Signature(s) Signed: 10/28/2023 8:11:18 AM By: [name] MD FACS Entered By: [name] on 10/28/2023 08:11:18 -------------------------------------------------------------------------------- Multi-Disciplinary Care Plan Details Patient Name: Date of Service: [name] IN, [name] 10/28/2023 7:45 A M Medical Record Number: [id number] Patient Account Number: [id number] Date of Birth/Sex: Treating RN: 11-02-1971 (52 y.o. [name] Primary Care Keishawn Darsey: [name] Other Clinician: Referring Wilfred Siverson: Treating Rochester Serpe/Extender: [name] in Treatment: 39 Active Inactive Wound/Skin Impairment Nursing Diagnoses: Impaired tissue integrity Knowledge deficit related to ulceration/compromised skin integrity Goals: Patient will have a decrease in wound volume by X% from date: (specify in notes) Date Initiated: 01/24/2023 Target Resolution Date: 12/30/2023 Goal Status: Active Patient/caregiver will verbalize understanding of skin care regimen Date Initiated: 01/24/2023 Target Resolution Date: 12/30/2023 Goal Status: Active Ulcer/skin breakdown will have a  volume reduction of 30% by week 4 Date Initiated: 01/24/2023 Date Inactivated: 03/07/2023 Target Resolution Date: 02/27/2023 Goal Status: Unmet Unmet Reason: larger today. Ulcer/skin breakdown will have a volume reduction of 50% by week 8 [name] ([id number]) [id number] Page 5 of 8 Date Initiated: 01/24/2023 Target Resolution Date: 12/30/2023 Goal Status: Active Interventions: Assess patient/caregiver ability to obtain necessary supplies Assess patient/caregiver ability to perform  ulcer/skin care regimen upon admission and as needed Assess ulceration(s) every visit Notes: Electronic Signature(s) Signed: 10/28/2023 1:45:14 PM By: [name] Entered By: [name] on 10/28/2023 07:49:24 -------------------------------------------------------------------------------- Pain Assessment Details Patient Name: Date of Service: [name] IN, [name] 10/28/2023 7:45 A M Medical Record Number: [id number] Patient Account Number: [id number] Date of Birth/Sex: Treating RN: 06/12/1971 (52 y.o. [name] Primary Care Ednah Hammock: [name] Other Clinician: Referring Kilynn Fitzsimmons: Treating Raymund Manrique/Extender: [name] in Treatment: 39 Active Problems Location of Pain Severity and Description of Pain Patient Has Paino No Site Locations Rate the pain. Current Pain Level: 0 Pain Management and Medication Current Pain Management: Electronic Signature(s) Signed: 10/28/2023 1:45:14 PM By: [name] Entered By: [name] on 10/28/2023 07:48:31 -------------------------------------------------------------------------------- Patient/Caregiver Education Details Patient Name: Date of Service: [name] IN, [name] 10/29/2024andnbsp7:45 A M Medical Record Number: [id number] Patient Account Number: [id number] WINNER, COURTWRIGHT ([id number]) [id number] Page 6 of 8 Date of Birth/Gender: Treating RN: 10/01/1971 (52 y.o. [name] Primary Care Physician: [name] Other Clinician: Referring Physician: Treating Physician/Extender: [name] in Treatment: 47 Education Assessment Education Provided To: Patient Education Topics Provided Wound/Skin Impairment: Methods: Explain/Verbal Responses: Reinforcements needed, State content correctly Electronic Signature(s) Signed: 10/28/2023 1:45:14 PM By: [name] Entered By: [name] on 10/28/2023 07:49:37 -------------------------------------------------------------------------------- Wound Assessment Details Patient Name: Date of Service: [name] IN, [name] 10/28/2023 7:45 A M Medical Record Number: [id number] Patient Account Number: [id number] Date of Birth/Sex: Treating RN: [birth year] (52 y.o. [name] Primary Care Terilyn Sano: [name] Other Clinician: Referring Rilley Poulter: Treating Devan Babino/Extender: [name] in Treatment: 39 Wound Status Wound Number: 3 Primary Diabetic Wound/Ulcer of the Lower Extremity Etiology: Wound Location: Left, Plantar Foot Wound Open Wounding Event: Gradually Appeared Status: Date Acquired: 10/01/2022 Comorbid Chronic sinus problems/congestion, Congestive Heart Failure, Weeks Of Treatment: 39 History: Coronary Artery Disease, Hypertension, Myocardial Infarction, Type Clustered Wound: No II Diabetes, Neuropathy Pending Amputation On Presentation Wound under treatment by Markella Dao outside of Wound Center Photos Wound Measurements Length: (cm) 0.4 Width: (cm) 0.3 Depth: (cm) 0.1 Area: (cm) 0.094 Volume: (cm) 0.009 % Reduction in Area: 97.6% % Reduction in Volume: 98.9% Epithelialization: Medium (34-66%) Tunneling: No Undermining: No Wound Description Classification: Grade 1 Wound Margin: Thickened Exudate Amount: ROWELL, FOSNIGHT ([id number]) Exudate Type: Serosanguineous Exudate Color: red, brown Foul Odor After Cleansing: No Slough/Fibrino Yes [id number] Page 7 of 8 Wound Bed Granulation Amount: Large (67-100%) Exposed Structure Granulation Quality: Red, Pink Fascia Exposed: No Necrotic Amount: Small (1-33%) Fat Layer (Subcutaneous Tissue) Exposed: Yes Necrotic Quality: Adherent Slough Tendon Exposed: No Muscle Exposed: No Joint Exposed: No Bone Exposed: No Periwound Skin Texture Texture Color No Abnormalities Noted:  No No Abnormalities Noted: Yes Callus: Yes Temperature / Pain Crepitus: No Temperature: No Abnormality Excoriation: No Induration: No Rash: No Scarring: No Moisture No Abnormalities Noted: Yes Treatment Notes Wound #3 (Foot) Wound Laterality: Plantar, Left Cleanser Soap and Water Discharge Instruction: May shower and wash wound with dial antibacterial soap and water prior to dressing change. Vashe  volume reduction of 30% by week 4 Date Initiated: 01/24/2023 Date Inactivated: 03/07/2023 Target Resolution Date: 02/27/2023 Goal Status: Unmet Unmet Reason: larger today. Ulcer/skin breakdown will have a volume reduction of 50% by week 8 [name] ([id number]) [id number] Page 5 of 8 Date Initiated: 01/24/2023 Target Resolution Date: 12/30/2023 Goal Status: Active Interventions: Assess patient/caregiver ability to obtain necessary supplies Assess patient/caregiver ability to perform  ulcer/skin care regimen upon admission and as needed Assess ulceration(s) every visit Notes: Electronic Signature(s) Signed: 10/28/2023 1:45:14 PM By: [name] Entered By: [name] on 10/28/2023 07:49:24 -------------------------------------------------------------------------------- Pain Assessment Details Patient Name: Date of Service: [name] IN, [name] 10/28/2023 7:45 A M Medical Record Number: [id number] Patient Account Number: [id number] Date of Birth/Sex: Treating RN: 06/12/1971 (52 y.o. [name] Primary Care Ednah Hammock: [name] Other Clinician: Referring Kilynn Fitzsimmons: Treating Raymund Manrique/Extender: [name] in Treatment: 39 Active Problems Location of Pain Severity and Description of Pain Patient Has Paino No Site Locations Rate the pain. Current Pain Level: 0 Pain Management and Medication Current Pain Management: Electronic Signature(s) Signed: 10/28/2023 1:45:14 PM By: [name] Entered By: [name] on 10/28/2023 07:48:31 -------------------------------------------------------------------------------- Patient/Caregiver Education Details Patient Name: Date of Service: [name] IN, [name] 10/29/2024andnbsp7:45 A M Medical Record Number: [id number] Patient Account Number: [id number] WINNER, COURTWRIGHT ([id number]) [id number] Page 6 of 8 Date of Birth/Gender: Treating RN: 10/01/1971 (52 y.o. [name] Primary Care Physician: [name] Other Clinician: Referring Physician: Treating Physician/Extender: [name] in Treatment: 47 Education Assessment Education Provided To: Patient Education Topics Provided Wound/Skin Impairment: Methods: Explain/Verbal Responses: Reinforcements needed, State content correctly Electronic Signature(s) Signed: 10/28/2023 1:45:14 PM By: [name] Entered By: [name] on 10/28/2023 07:49:37 -------------------------------------------------------------------------------- Wound Assessment Details Patient Name: Date of Service: [name] IN, [name] 10/28/2023 7:45 A M Medical Record Number: [id number] Patient Account Number: [id number] Date of Birth/Sex: Treating RN: [birth year] (52 y.o. [name] Primary Care Terilyn Sano: [name] Other Clinician: Referring Rilley Poulter: Treating Devan Babino/Extender: [name] in Treatment: 39 Wound Status Wound Number: 3 Primary Diabetic Wound/Ulcer of the Lower Extremity Etiology: Wound Location: Left, Plantar Foot Wound Open Wounding Event: Gradually Appeared Status: Date Acquired: 10/01/2022 Comorbid Chronic sinus problems/congestion, Congestive Heart Failure, Weeks Of Treatment: 39 History: Coronary Artery Disease, Hypertension, Myocardial Infarction, Type Clustered Wound: No II Diabetes, Neuropathy Pending Amputation On Presentation Wound under treatment by Markella Dao outside of Wound Center Photos Wound Measurements Length: (cm) 0.4 Width: (cm) 0.3 Depth: (cm) 0.1 Area: (cm) 0.094 Volume: (cm) 0.009 % Reduction in Area: 97.6% % Reduction in Volume: 98.9% Epithelialization: Medium (34-66%) Tunneling: No Undermining: No Wound Description Classification: Grade 1 Wound Margin: Thickened Exudate Amount: ROWELL, FOSNIGHT ([id number]) Exudate Type: Serosanguineous Exudate Color: red, brown Foul Odor After Cleansing: No Slough/Fibrino Yes [id number] Page 7 of 8 Wound Bed Granulation Amount: Large (67-100%) Exposed Structure Granulation Quality: Red, Pink Fascia Exposed: No Necrotic Amount: Small (1-33%) Fat Layer (Subcutaneous Tissue) Exposed: Yes Necrotic Quality: Adherent Slough Tendon Exposed: No Muscle Exposed: No Joint Exposed: No Bone Exposed: No Periwound Skin Texture Texture Color No Abnormalities Noted:  No No Abnormalities Noted: Yes Callus: Yes Temperature / Pain Crepitus: No Temperature: No Abnormality Excoriation: No Induration: No Rash: No Scarring: No Moisture No Abnormalities Noted: Yes Treatment Notes Wound #3 (Foot) Wound Laterality: Plantar, Left Cleanser Soap and Water Discharge Instruction: May shower and wash wound with dial antibacterial soap and water prior to dressing change. Vashe  Coronary Artery Disease, Hypertension, Myocardial Infarction, Type II Diabetes, Neuropathy 10/01/2022 N/A N/A Date Acquired: 42 N/A N/A Weeks of Treatment: Open N/A N/A Wound Status: No N/A N/A Wound Recurrence: Yes N/A N/A Pending [name] on Presentation: 0.4x0.3x0.1 N/A N/A Measurements L x W x D (cm) 0.094 N/A N/A A (cm) : rea 0.009 N/A N/A Volume (cm) : 97.60% N/A N/A % Reduction in A rea: 98.90% N/A N/A % Reduction in Volume: Grade 1 N/A N/A Classification: Medium N/A N/A Exudate A mount: Serosanguineous N/A N/A Exudate Type: red, brown N/A N/A Exudate Color: Thickened N/A N/A Wound Margin: Large (67-100%) N/A N/A Granulation A mount: Red, Pink N/A N/A Granulation Quality: Small (1-33%) N/A N/A Necrotic A mount: Fat Layer (Subcutaneous Tissue): Yes N/A N/A Exposed Structures: Fascia: No Tendon: No Muscle: No Joint: No Bone: No Medium (34-66%) N/A N/A Epithelialization: Debridement - Selective/Open Wound N/A N/A Debridement: Pre-procedure  Verification/Time Out 08:04 N/A N/A Taken: Callus, Slough N/A N/A Tissue Debrided: Skin/Epidermis N/A N/A Level: 0.09 N/A N/A Debridement A (sq cm): rea Curette N/A N/A Instrument: Minimum N/A N/A Bleeding: Pressure N/A N/A Hemostasis A chieved: Procedure was tolerated well N/A N/A Debridement Treatment Response: 0.4x0.3x0.1 N/A N/A Post Debridement Measurements L x W x D (cm) 0.009 N/A N/A Post Debridement Volume: (cm) Callus: Yes N/A N/A Periwound Skin Texture: Excoriation: No Induration: No Crepitus: No Rash: No Scarring: No Maceration: Yes N/A N/A Periwound Skin Moisture: Dry/Scaly: No Atrophie Blanche: No N/A N/A Periwound Skin Color: Cyanosis: No Ecchymosis: No Erythema: No Hemosiderin Staining: No Mottled: No Pallor: No Rubor: No No Abnormality N/A N/A Temperature: Debridement N/A N/A Procedures Performed: T Contact Cast otal Treatment Notes Wound #3 (Foot) Wound Laterality: Plantar, Left Cleanser Soap and Water Discharge Instruction: May shower and wash wound with dial antibacterial soap and water prior to dressing change. Vashe 5.8 (oz) Discharge Instruction: Cleanse the wound with Vashe prior to applying a clean dressing using gauze sponges, not tissue or cotton balls. Peri-Wound Care RAYFIELD, LAFLEUR ([id number]) [id number] Page 4 of 8 Zinc Oxide Ointment 30g tube Discharge Instruction: Apply Zinc Oxide to periwound with each dressing change Topical Gentamicin Discharge Instruction: As directed by physician Mupirocin Ointment Discharge Instruction: Apply Mupirocin (Bactroban) as instructed Skintegrity Hydrogel 4 (oz) Discharge Instruction: Apply hydrogel as directed Primary Dressing Promogran Prisma Matrix, 4.34 (sq in) (silver collagen) Discharge Instruction: Moisten collagen with saline or hydrogel Secondary Dressing Woven Gauze Sponge, Non-Sterile 4x4 in Discharge Instruction: Apply over primary dressing as  directed. Zetuvit Plus 4x8 in Discharge Instruction: Apply over primary dressing as directed. Secured With [employer], 4.5x3.1 (in/yd) Discharge Instruction: Secure with Kerlix as directed. Compression Wrap Compression Stockings Add-Ons Electronic Signature(s) Signed: 10/28/2023 8:11:18 AM By: [name] MD FACS Entered By: [name] on 10/28/2023 08:11:18 -------------------------------------------------------------------------------- Multi-Disciplinary Care Plan Details Patient Name: Date of Service: [name] IN, [name] 10/28/2023 7:45 A M Medical Record Number: [id number] Patient Account Number: [id number] Date of Birth/Sex: Treating RN: 11-02-1971 (52 y.o. [name] Primary Care Keishawn Darsey: [name] Other Clinician: Referring Wilfred Siverson: Treating [place] Serpe/Extender: [name] in Treatment: 39 Active Inactive Wound/Skin Impairment Nursing Diagnoses: Impaired tissue integrity Knowledge deficit related to ulceration/compromised skin integrity Goals: Patient will have a decrease in wound volume by X% from date: (specify in notes) Date Initiated: 01/24/2023 Target Resolution Date: 12/30/2023 Goal Status: Active Patient/caregiver will verbalize understanding of skin care regimen Date Initiated: 01/24/2023 Target Resolution Date: 12/30/2023 Goal Status: Active Ulcer/skin breakdown will have a

## 2023-10-29 ENCOUNTER — Other Ambulatory Visit (HOSPITAL_COMMUNITY): Payer: Self-pay

## 2023-11-03 ENCOUNTER — Other Ambulatory Visit: Payer: Self-pay

## 2023-11-04 ENCOUNTER — Encounter (HOSPITAL_BASED_OUTPATIENT_CLINIC_OR_DEPARTMENT_OTHER): Payer: 59 | Attending: General Surgery | Admitting: General Surgery

## 2023-11-04 DIAGNOSIS — I2510 Atherosclerotic heart disease of native coronary artery without angina pectoris: Secondary | ICD-10-CM | POA: Insufficient documentation

## 2023-11-04 DIAGNOSIS — I252 Old myocardial infarction: Secondary | ICD-10-CM | POA: Insufficient documentation

## 2023-11-04 DIAGNOSIS — I5042 Chronic combined systolic (congestive) and diastolic (congestive) heart failure: Secondary | ICD-10-CM | POA: Diagnosis not present

## 2023-11-04 DIAGNOSIS — E1140 Type 2 diabetes mellitus with diabetic neuropathy, unspecified: Secondary | ICD-10-CM | POA: Insufficient documentation

## 2023-11-04 DIAGNOSIS — Z89422 Acquired absence of other left toe(s): Secondary | ICD-10-CM | POA: Insufficient documentation

## 2023-11-04 DIAGNOSIS — L97522 Non-pressure chronic ulcer of other part of left foot with fat layer exposed: Secondary | ICD-10-CM | POA: Diagnosis not present

## 2023-11-04 DIAGNOSIS — Z794 Long term (current) use of insulin: Secondary | ICD-10-CM | POA: Diagnosis not present

## 2023-11-04 DIAGNOSIS — I130 Hypertensive heart and chronic kidney disease with heart failure and stage 1 through stage 4 chronic kidney disease, or unspecified chronic kidney disease: Secondary | ICD-10-CM | POA: Insufficient documentation

## 2023-11-04 DIAGNOSIS — E11621 Type 2 diabetes mellitus with foot ulcer: Secondary | ICD-10-CM | POA: Insufficient documentation

## 2023-11-04 DIAGNOSIS — Z7984 Long term (current) use of oral hypoglycemic drugs: Secondary | ICD-10-CM | POA: Insufficient documentation

## 2023-11-04 NOTE — Progress Notes (Signed)
[name] ([id number]) [id number] Page 1 of 14 Visit Report for 11/04/2023 Chief Complaint Document Details Patient Name: Date of Service: [name] [place], [name] RO LD 11/04/2023 7:45 A M Medical Record Number: [id number] Patient Account Number: [id number] Date of Birth/Sex: Treating RN: 12/29/1971 (52 y.o. M) Primary Care Provider: [name] Other Clinician: Referring Provider: Treating Provider/Extender: [name] in Treatment: 40 Information Obtained from: Patient Chief Complaint 01/15/2023; left foot wound Electronic Signature(s) Signed: 11/04/2023 8:09:38 AM By: [name] MD FACS Entered By: [name] on 11/04/2023 05:09:38 -------------------------------------------------------------------------------- Cellular or Tissue Based Product Details Patient Name: Date of Service: [name] [place], [name] 11/04/2023 7:45 A M Medical Record Number: [id number] Patient Account Number: [id number] Date of Birth/Sex: Treating RN: [birth year] (52 y.o. [name] Primary Care Provider: [name] Other Clinician: Referring Provider: Treating Provider/Extender: [name] in Treatment: 40 Cellular or Tissue Based Product Type Wound #3 Left,Plantar Foot Applied to: Performed By: Physician [name], MD The following information was scribed by: [name] The information was scribed for: [name] Cellular or Tissue Based Product Type: Grafix prime Level of Consciousness (Pre-procedure): Awake and Alert Pre-procedure Verification/Time Out Yes - 08:02 Taken: Location: genitalia / hands / feet / multiple digits Wound Size (sq cm): 0.12 Product Size (sq cm): 2 Waste Size (sq cm): 0 Amount of Product Applied (sq cm): 2 Instrument Used: Forceps, Scissors Lot #: [id number] Expiration Date: 11/29/2024 Fenestrated: No Reconstituted: Yes Solution Type: normal  saline Solution Amount: 2 ml Lot #: [id number] KS Solution Expiration Date: 04/19/2025 Secured: Yes Secured With: Steri-Strips Dressing Applied: Yes Primary Dressing: adaptic Procedural Pain: 0 Post Procedural Pain: 0 Response to Treatment: Procedure was tolerated well [name] ([id number]) [id number] Page 2 of 14 Level of Consciousness (Post- Awake and Alert procedure): Post Procedure Diagnosis Same as Pre-procedure Electronic Signature(s) Signed: 11/04/2023 8:45:54 AM By: [name] MD FACS Signed: 11/04/2023 3:30:49 PM By: Gelene Mink By: [name] on 11/04/2023 05:26:42 -------------------------------------------------------------------------------- Debridement Details Patient Name: Date of Service: [name] [place], [name] 11/04/2023 7:45 A M Medical Record Number: [id number] Patient Account Number: [id number] Date of Birth/Sex: Treating RN: 12/05/1971 (52 y.o. [name] Primary Care Provider: [name] Other Clinician: Referring Provider: Treating Provider/Extender: [name] in Treatment: 40 Debridement Performed for Assessment: Wound #3 Left,Plantar Foot Performed By: Physician [name], MD The following information was scribed by: [name] The information was scribed for: [name] Debridement Type: Debridement Severity of Tissue Pre Debridement: Fat layer exposed Level of Consciousness (Pre-procedure): Awake and Alert Pre-procedure Verification/Time Out Yes - 07:59 Taken: Start Time: 07:59 Percent of Wound Bed Debrided: 100% T Area Debrided (cm): otal 0.09 Tissue and other material debrided: Non-Viable, Callus, Slough, Subcutaneous, Skin: Epidermis, Slough Level: Skin/Subcutaneous Tissue Debridement Description: Excisional Instrument: Curette Bleeding: Minimum Hemostasis Achieved: Pressure Response to Treatment: Procedure was  tolerated well Level of Consciousness (Post- Awake and Alert procedure): Post Debridement Measurements of Total Wound Length: (cm) 0.4 Width: (cm) 0.3 Depth: (cm) 0.2 Volume: (cm) 0.019 Character of Wound/Ulcer Post Debridement: Improved Severity of Tissue Post Debridement: Fat layer exposed Post Procedure Diagnosis Same as Pre-procedure Electronic Signature(s) Signed: 11/04/2023 8:45:54 AM By: [name] MD FACS Signed: 11/04/2023 3:30:49 PM By: [name] Entered By: [name] on 11/04/2023 05:02:10 [name] ([id number]) [id number] Page 3 of 14 -------------------------------------------------------------------------------- HPI Details Patient Name: Date of Service: [name] [place], [name] RO LD 11/04/2023 7:45 A M Medical Record  Number: [id number] Patient Account Number: [id number] Date of Birth/Sex: Treating RN: June 17, 1971 (52 y.o. M) Primary Care Provider: [name] Other Clinician: Referring Provider: Treating Provider/Extender: [name] in Treatment: 40 History of Present Illness HPI Description: ADMISSION 01/11/2020 This is a 52 year old man with uncontrolled type 2 diabetes with a recent hemoglobin A1c earlier this year of 13.4. He is on insulin and glipizide. He does not take his blood sugars at home. He does have a follow-up with primary care later this month I believe on January 27. He tells [place] that roughly a month ago he was walking with a shoe with a hole in his foot. He took the shoe off and there was an open wound at roughly the left fourth met head. This has significant undermining and raised edges. He has not noticed any purulence he does not feel unwell. More recently he was taking skin off the bottom of his foot and has a superficial area on the left fifth met head. He has not been offloading this. The patient was in the ER on 12/20. They gave him Bactroban which she has  been using on the wound and 10 days worth of doxycycline. No x-rays were done. He has not had vascular studies. He is also been using hydrogen peroxide. Past medical history type 2 diabetes uncontrolled, chronic systolic heart failure, coronary artery disease with a history of congestive heart failure with stents. Hypertension hyperlipidemia and chronic renal insufficiency ABI in this clinic was 1.14 on the left. 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 He is uncertain whether he would be able to work if we put him on some form of restriction 1/19; he is generally doing quite well. Using silver alginate on the wounds. Things actually look better. He has a forefoot offloading boot which she seems to be compliant about. He has support at work to stay off his foot is much as possible which is gratifying. Culture I did last week showed a few Enterococcus faecalis. I am going to put him on Augmentin. I talked about ordering an x-[name] in my note last week but that does not seem to have happened. We will review reorder the x-[name] this week. 1/26; x-[name] reordered last week was negative for osteomyelitis. We are using silver alginate on the wound on the third and fifth met heads on the left. He is using a Darco forefoot off loader 2/2; the area on the fifth met head is closed. Third met head is still open with tunneling depth and thick callus. 2/9; the area on the fifth met head remains closed however the third met head again has a small open area on presentation with tunneling in depth and surrounded by thick callus. This looks like a pressure issue. We have been using silver alginate 2/16; the area of the fifth metatarsal head remains closed however the area over the third metatarsal head again is a small open area but with some depth. I do not think this is changed much since last week. He is using Hydrofera Blue with forefoot off loader. He is not able to use a total contact  cast on the left leg because he needs his left leg at work [employer]]. Fortunately the wound does not look infected. I changed him to endoform today 2/26; the area of the fifth metatarsal head remains closed. The area of the third metatarsal head has an even smaller opening this time. I used endoform on  this this looks improved. He is offloading this is much as he can and a forefoot off loader on the left. He cannot have a total contact cast because of work responsibilities 3/5; the area on the fifth metatarsal head remains closed the area on the third metatarsal head is also closed on the left foot. 01/15/2023 Mr. 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 He has been following for podiatry for several months for this issue. He is currently using wet-to-dry saline dressings. He currently denies signs of infection. [place] note from 1/17; Patient presents with a 9-month history of nonhealing ulcer to the left foot secondary to diabetes and inability to offload well the area. He has had multiple debridements in the past to his feet bilaterally. He has had resection of the fourth left met head in the fifth toe Secondary to osteomyelitis. We discussed the importance of glycemic control for wound healing. Due to his blood glucose levels being elevated he is at high risk for infection and thus further amputation. He expressed understanding. He states he is supposed to be referred to an endocrinologist at [facility] however the referral fell through. Unclear what happened. Offered a referral to endocrinology at [facility]. Patient was agreeable.Furthermore we  discussed the importance of aggressive offloading for his wound healing. This will be the most difficult part of the treatment plan for the patient to do. We discussed a total contact cast however he has declined that at this time. 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 He is currently using a surgical shoe with a peg assist. It does not fit well so we will give him a new one today. I do not think this is enough offloading. If he is not able to offload this area he will likely end up with a BKA. He is well aware of this. For now I recommended Medihoney and Hydrofera Blue for dressing changes. He will follow-up in our [place] office Since this is closer for him. 1/26; patient presents for follow-up. Last clinic visit I had seen him in [place] and we transferred him to [place] since this is a closer location for him. Progress note above from that visit. Over the past week He has been using Medihoney and Hydrofera Blue to the wound bed. He has been using his surgical shoe with peg assist. He has no issues or complaints today. We discussed doing the total contact cast and he was agreeable to having this placed at next clinic visit. 2/2; patient presents for follow-up. He has been using Vashe wet-to-dry dressings. Plan is for the total contact cast today. 2/5; patient presents for follow-up. The cast was placed 3 days ago. He tolerated this well however had a lot of drainage. He currently denies signs of infection. 2/9; patient presents for follow-up. At last clinic visit we held off on replacing the cast due to drainage. He has been using Vashe wet-to-dry dressings and he is currently taking the oral antibiotics prescribed without issues. He declines a total contact cast today. We ran insurance verification for skin substitute and due to cost patient  declines having this placed. 2/16; patient presents for follow-up. He  has decided not to follow-up with podiatry. He continues to decline the total contact cast. He has been using Vashe wet- to-dry dressings. He has not been wearing his surgical shoe with peg assist. He currently denies signs of infection. 3/1; patient presents for follow-up. He has been doing Vashe wet-to-dry dressings. He has a surgical shoe with peg assist. We discussed potentially doing a skin substitute for which she has been approved for by insurance. He knows the out-of-pocket cost of this and would still like to proceed with having this placed today. He denies the current total contact cast. 3/8; patient presents for follow-up. He states that the skin substitute came off after a few days and he has been using Vashe wet-to-dry dressings. He has mild odor to the wound bed on exam. LABRANDON, KNOCH ([id number]) [id number] Page 4 of 14 3/15; patient presents for follow-up. He has been using Vashe wet-to-dry dressings and started his course of Augmentin. He reports improvement in wound healing. He has no issues or complaints today. 3/21; patient presents for follow-up. He continues to use Vashe wet-to-dry dressings. He has no issues or complaints today. 3/28; patient presents for follow-up. He has been using Vashe wet-to-dry dressings. He has no issues or complaints today. He has been using his peg assist with surgical shoe. 4/11; patient presents for follow-up. He has been using Vashe wet-to-dry dressings. The periwound is macerated. He has been using his peg assist with surgical shoe. He has no issues or complaints today. 4/25; patient presents for follow-up. Has been using Vashe wet-to-dry dressings. He has been using zinc oxide to the periwound. For offloading he has been using his peg assist in surgical shoe. He has no issues or complaints today. 5/9; patient presents for follow-up. He has been using Vashe wet-to-dry dressings. He has no issues or complaints today.  He has a surgical shoe with peg assist for offloading. 5/23; patient presents for follow up. He has been using Vashe to clean the wound bed and collagen and blast X with dressing changes. [place] went to the beach [place] 2 weeks. Wound is bigger. 5/31; patient presents for follow-up. He has been using Vashe wet-to-dry dressings. Wound is stable but with healthier granulation tissue. Patient would like to hold off on the total contact cast and try a knee scooter for offloading. 6/13; patient presents for follow-up. He has been using Vashe wet-to-dry dressings. He has been using a knee scooter for offloading however states he felt unstable on this and decided to buy an electric scooter. This arrived in the mail today. He would like to hold off on the left total contact cast. Wound is slightly smaller today. 6/27; patient presents for follow-up. He has been using Vashe wet-to-dry dressings. He is using an Art gallery manager for mobility to help relieve the pressure off the wound bed. Wound is smaller today. He wants to hold off on doing the total contact cast as he is showing signs of improvement with wound healing. 7/11; patient presents for follow-up. He has been using Vashe wet-to-dry dressings and his electric scooter for offloading. Wound is slightly smaller. 7/25; patient presents for follow up. He has been using Vashe wet-to-dry dressings and he reports using his electric scooter for offloading. Wound is larger. 7/30; patient presents for follow-up. He has been using Vashe wet-to-dry dressings. He has taken Augmentin as prescribed without issues. Overall wound has healthier granulation tissue. 8/1; patient presents for follow-up. He  presents for obligatory cast change. He tolerated the cast well and has no issues or complaints today. We have been using antibiotic ointment with Hydrofera Blue under the cast. Wound is slightly smaller. 8/8; patient presents for follow-up. We have been using antibiotic  ointment with Hydrofera Blue under the total contact cast. Wound is smaller. He has no issues or complaints today. 8/13; patient presents for follow-up. We have been using antibiotic ointment with Hydrofera Blue under the total contact cast. He comes in today because the top part of the cast over the foot has sunken as well as on the side. He wanted to assure that there were no issues. 8/22; patient presents for follow-up. He has been using Hydrofera Blue to the wound bed. Previously we have been using a total contact cast. We do not have total contact casts due to manufacturing issues available for patients. He has no issues or complaints. He denies signs of infection. 8/27; patient presents for follow-up. He has been using Vashe wet-to-dry dressings. T contact cast is available for placement today. He was agreeable to otal proceed with this. He denies signs of infection. 9/3; patient presents for follow-up. We have been using Hydrofera Blue with antibiotic ointment under the total contact cast for the past week. He has tolerated this well. The wound is smaller. Unfortunately we do not have a total contact cast in office today for replacement as there is been manufacturing issues. 9/5; patient was seen earlier this week and plan was for a total contact cast but there is none available. The cast finally arrived in patient has come in today to have this placed. We will continue with Hydrofera Blue and antibiotic ointment under the cast. 9/12; patient presents for follow-up. We have been using Hydrofera Blue and antibiotic ointment under the cast. Wound is stable. He denies signs of infection. 09/17/2023: The wound is measuring smaller today. The surface appears healthy and there is minimal accumulation of slough. 09/23/2023: The wound external measurements remain about the same, but it is shallower and visually appears smaller. 09/30/2023: The wound is covered with a layer of callus. Underneath the  callus, there is a tiny superficial residual opening. 10/14/2023: He returns after being away for 2 weeks, during which he was out of a cast. Not surprisingly, his wound has reopened substantially. It remains clean, however. 10/21/2023: We have recovered most of what was lost as far as progress with his absence from clinic. The wound did measure smaller today. It is a little bit macerated around the edges and he reports that he thinks some water got in over the top of his cast while showering. 10/28/2023: The wound measures smaller today. There is no tissue maceration. There is some slough on the surface and a little bit of callus accumulation around the edges. 11/04/2023: The wound measured a little bit deeper today and had some undermining, but this seems to be due to mainly to accumulated callus. The surface has a bit of slough. He has been approved for Grafix so we will plan to apply that today. Electronic Signature(s) Signed: 11/04/2023 8:10:32 AM By: [name] MD FACS Entered By: [name] on 11/04/2023 05:10:32 [name] ([id number]) [id number] Page 5 of 14 -------------------------------------------------------------------------------- Physical Exam Details Patient Name: Date of Service: [name] [place], [name] RO LD 11/04/2023 7:45 A M Medical Record Number: [id number] Patient Account Number: [id number] Date of Birth/Sex: Treating RN: [birth year] (52 y.o. M) Primary Care Provider: [name] Other Clinician: Referring Provider: Treating Provider/Extender: Lady Gary  Maryelizabeth Rowan, Octavio Manns in Treatment: 40 Constitutional . . . . no acute distress. Respiratory Normal work of breathing on room air.. Notes 11/04/2023: The wound measured a little bit deeper today and had some undermining, but this seems to be due to mainly to accumulated callus. The surface has a bit of slough. Electronic Signature(s) Signed: 11/04/2023 8:11:19 AM By:  [name] MD FACS Entered By: [name] on 11/04/2023 05:11:19 -------------------------------------------------------------------------------- Physician Orders Details Patient Name: Date of Service: [name] [place], [name] 11/04/2023 7:45 A M Medical Record Number: [id number] Patient Account Number: [id number] Date of Birth/Sex: Treating RN: [birth year] (52 y.o. [name] Primary Care Provider: [name] Other Clinician: Referring Provider: Treating Provider/Extender: [name] in Treatment: 40 The following information was scribed by: [name] The information was scribed for: [name] Verbal / Phone Orders: No Diagnosis Coding ICD-10 Coding Code Description [phone] 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.621 Type 2 diabetes mellitus with foot ulcer Z89.422 Acquired absence of other left toe(s) Follow-up Appointments ppointment in 1 week. - Dr. Lady Gary - room 2 Return A Cellular or Tissue Based Products Cellular or Tissue Based Product Type: - Grafix prime #1 Cellular or Tissue Based Product applied to wound bed, secured with steri-strips, cover with Adaptic or Mepitel. (DO NOT REMOVE). Bathing/ Shower/ Hygiene May shower with protection but do not get wound dressing(s) wet. Protect dressing(s) with water repellant cover (for example, large plastic bag) or a cast cover and may then take shower. Off-Loading Total Contact Cast to Left Lower Extremity - TCC size 3 heel cup to toes. foam pad to anterior ankle Removable cast walker boot to: - left foot VADHIR, MCNAY ([id number]) [id number] Page 6 of 14 Wound Treatment Wound #3 - Foot Wound Laterality: Plantar, Left Cleanser: Soap and Water 1 x Per Day/30 Days Discharge Instructions: May shower and wash wound with dial antibacterial soap and water prior to dressing change. Cleanser: Vashe  5.8 (oz) 1 x Per Day/30 Days Discharge Instructions: Cleanse the wound with Vashe prior to applying a clean dressing using gauze sponges, not tissue or cotton balls. Peri-Wound Care: Zinc Oxide Ointment 30g tube 1 x Per Day/30 Days Discharge Instructions: Apply Zinc Oxide to periwound with each dressing change Topical: Gentamicin 1 x Per Day/30 Days Discharge Instructions: As directed by physician Topical: Mupirocin Ointment 1 x Per Day/30 Days Discharge Instructions: Apply Mupirocin (Bactroban) as instructed Prim Dressing: ADAPTIC TOUCH 3x4.25 (in/in) 1 x Per Day/30 Days ary Discharge Instructions: Apply to wound bed as instructed Secondary Dressing: Woven Gauze Sponge, Non-Sterile 4x4 in 1 x Per Day/30 Days Discharge Instructions: Apply over primary dressing as directed. Secondary Dressing: Zetuvit Plus 4x8 in 1 x Per Day/30 Days Discharge Instructions: Apply over primary dressing as directed. Secured With: [employer], 4.5x3.1 (in/yd) 1 x Per Day/30 Days Discharge Instructions: Secure with Kerlix as directed. Electronic Signature(s) Signed: 11/04/2023 8:45:54 AM By: [name] MD FACS Entered By: [name] on 11/04/2023 05:11:30 -------------------------------------------------------------------------------- Problem List Details Patient Name: Date of Service: [name] [place], [name] 11/04/2023 7:45 A M Medical Record Number: [id number] Patient Account Number: [id number] Date of Birth/Sex: Treating RN: Mar 25, 1971 (52 y.o. M) Primary Care Provider: [name] Other Clinician: Referring Provider: Treating Provider/Extender: [name] in Treatment: 40 Active Problems ICD-10 Encounter Code Description Active Date MDM Diagnosis [phone] Non-pressure chronic ulcer of other part of left foot with fat layer exposed  01/24/2023 No Yes E11.621 Type 2 diabetes mellitus with foot ulcer 01/24/2023 No Yes Z89.422 Acquired absence of other left  toe(s) 01/24/2023 No Yes Inactive Problems Resolved Problems BENJAMYN, HESTAND ([id number]) [id number] Page 7 of 14 Electronic Signature(s) Signed: 11/04/2023 8:07:46 AM By: [name] MD FACS Entered By: [name] on 11/04/2023 05:07:46 -------------------------------------------------------------------------------- Progress Note Details Patient Name: Date of Service: [name] [place], [name] 11/04/2023 7:45 A M Medical Record Number: [id number] Patient Account Number: [id number] Date of Birth/Sex: Treating RN: Oct 04, 1971 (52 y.o. M) Primary Care Provider: [name] Other Clinician: Referring Provider: Treating Provider/Extender: [name] in Treatment: 40 Subjective Chief Complaint Information obtained from Patient 01/15/2023; left foot wound History of Present Illness (HPI) ADMISSION 01/11/2020 This is a 52 year old man with uncontrolled type 2 diabetes with a recent hemoglobin A1c earlier this year of 13.4. He is on insulin and glipizide. He does not take his blood sugars at home. He does have a follow-up with primary care later this month I believe on January 27. He tells [place] that roughly a month ago he was walking with a shoe with a hole in his foot. He took the shoe off and there was an open wound at roughly the left fourth met head. This has significant undermining and raised edges. He has not noticed any purulence he does not feel unwell. More recently he was taking skin off the bottom of his foot and has a superficial area on the left fifth met head. He has not been offloading this. The patient was in the ER on 12/20. They gave him Bactroban which she has been using on the wound and 10 days worth of doxycycline. No x-rays were done. He has not had vascular studies. He is also been using hydrogen peroxide. Past medical history type 2 diabetes uncontrolled, chronic systolic heart failure, coronary artery disease  with a history of congestive heart failure with stents. Hypertension hyperlipidemia and chronic renal insufficiency ABI in this clinic was 1.14 on the left. 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 He is uncertain whether he would be able to work if we put him on some form of restriction 1/19; he is generally doing quite well. Using silver alginate on the wounds. Things actually look better. He has a forefoot offloading boot which she seems to be compliant about. He has support at work to stay off his foot is much as possible which is gratifying. Culture I did last week showed a few Enterococcus faecalis. I am going to put him on Augmentin. I talked about ordering an x-[name] in my note last week but that does not seem to have happened. We will review reorder the x-[name] this week. 1/26; x-[name] reordered last week was negative for osteomyelitis. We are using silver alginate on the wound on the third and fifth met heads on the left. He is using a Darco forefoot off loader 2/2; the area on the fifth met head is closed. Third met head is still open with tunneling depth and thick callus. 2/9; the area on the fifth met head remains closed however the third met head again has a small open area on presentation with tunneling in depth and surrounded by thick callus. This looks like a pressure issue. We have been using silver alginate 2/16; the area of the fifth metatarsal head remains closed however the area over the third metatarsal head again is a small open area but with some depth.  I do not think this is changed much since last week. He is using Hydrofera Blue with forefoot off loader. He is not able to use a total contact cast on the left leg because he needs his left leg at work [employer]]. Fortunately the wound does not look infected. I changed him to endoform today 2/26; the area of the fifth metatarsal head remains closed. The area of the third metatarsal head has an  even smaller opening this time. I used endoform on this this looks improved. He is offloading this is much as he can and a forefoot off loader on the left. He cannot have a total contact cast because of work responsibilities 3/5; the area on the fifth metatarsal head remains closed the area on the third metatarsal head is also closed on the left foot. 01/15/2023 Mr. 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 He has been following for podiatry for several months for this issue. He is currently using wet-to-dry saline dressings. He currently denies signs of infection. [place] note from 1/17; Patient presents with a 83-month history of nonhealing ulcer to the left foot secondary to diabetes and inability to offload well the area. He has had multiple debridements in the past to his feet bilaterally. He has had resection of the fourth left met head in the fifth toe Secondary to osteomyelitis. We discussed the importance of glycemic control for wound healing. Due to his blood glucose levels being elevated he is at high risk for infection and thus further amputation. He expressed understanding. He states he is supposed to be referred to an endocrinologist at [facility] however the referral fell through. Unclear what happened. Offered a referral to endocrinology at [facility]. Patient was agreeable.Furthermore we discussed the importance of aggressive offloading for his wound healing. This will be the most difficult part of the treatment plan for the patient to do. We discussed a total contact cast however he has declined that at this time. 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 He is currently using a surgical shoe with a peg assist. It does not fit well so we will give him a new one today. I do not think this is enough offloading. If he is not able to offload this area he will likely end up with a BKA. He is well aware of this. For now I recommended Medihoney and Hydrofera Blue for dressing changes. He will follow-up in our [place] office Since this is closer for him. 1/26; patient presents for follow-up. Last clinic visit I had seen him in [place] and we transferred him to [place] since this is a closer location for him. Progress note above from that visit. Over the past week He has been using Medihoney and Hydrofera Blue to the wound bed. He has been using his surgical shoe with peg assist. He has no issues or complaints today. We discussed doing the total contact cast and he was agreeable to having this placed at next ROMIN, DIVITA ([id number]) [phone].pdf Page 8 of 14 clinic visit. 2/2; patient presents for follow-up. He has been using Vashe  wet-to-dry dressings. Plan is for the total contact cast today. 2/5; patient presents for follow-up. The cast was placed 3 days ago. He tolerated this well however had a lot of drainage. He currently denies signs of infection. 2/9; patient presents for follow-up. At last clinic visit we held off on replacing the cast due to drainage. He has been using Vashe wet-to-dry dressings and he is currently taking the oral antibiotics prescribed without issues. He declines a total contact cast today. We ran insurance verification for skin substitute and due to cost patient declines having this placed. 2/16; patient presents for follow-up. He has decided not to follow-up with podiatry. He continues to decline the total contact cast. He has been using Vashe wet- to-dry dressings. He has not been wearing his surgical shoe with  peg assist. He currently denies signs of infection. 3/1; patient presents for follow-up. He has been doing Vashe wet-to-dry dressings. He has a surgical shoe with peg assist. We discussed potentially doing a skin substitute for which she has been approved for by insurance. He knows the out-of-pocket cost of this and would still like to proceed with having this placed today. He denies the current total contact cast. 3/8; patient presents for follow-up. He states that the skin substitute came off after a few days and he has been using Vashe wet-to-dry dressings. He has mild odor to the wound bed on exam. 3/15; patient presents for follow-up. He has been using Vashe wet-to-dry dressings and started his course of Augmentin. He reports improvement in wound healing. He has no issues or complaints today. 3/21; patient presents for follow-up. He continues to use Vashe wet-to-dry dressings. He has no issues or complaints today. 3/28; patient presents for follow-up. He has been using Vashe wet-to-dry dressings. He has no issues or complaints today. He has been using his peg assist with surgical shoe. 4/11; patient presents for follow-up. He has been using Vashe wet-to-dry dressings. The periwound is macerated. He has been using his peg assist with surgical shoe. He has no issues or complaints today. 4/25; patient presents for follow-up. Has been using Vashe wet-to-dry dressings. He has been using zinc oxide to the periwound. For offloading he has been using his peg assist in surgical shoe. He has no issues or complaints today. 5/9; patient presents for follow-up. He has been using Vashe wet-to-dry dressings. He has no issues or complaints today. He has a surgical shoe with peg assist for offloading. 5/23; patient presents for follow up. He has been using Vashe to clean the wound bed and collagen and blast X with dressing changes. He went to the beach for 2 weeks. Wound is bigger. 5/31; patient presents for  follow-up. He has been using Vashe wet-to-dry dressings. Wound is stable but with healthier granulation tissue. Patient would like to hold off on the total contact cast and try a knee scooter for offloading. 6/13; patient presents for follow-up. He has been using Vashe wet-to-dry dressings. He has been using a knee scooter for offloading however states he felt unstable on this and decided to buy an electric scooter. This arrived in the mail today. He would like to hold off on the left total contact cast. Wound is slightly smaller today. 6/27; patient presents for follow-up. He has been using Vashe wet-to-dry dressings. He is using an Art gallery manager for mobility to help relieve the pressure off the wound bed. Wound is smaller today. He wants to hold off on doing the total contact cast as he is  showing signs of improvement with wound healing. 7/11; patient presents for follow-up. He has been using Vashe wet-to-dry dressings and his electric scooter for offloading. Wound is slightly smaller. 7/25; patient presents for follow up. He has been using Vashe wet-to-dry dressings and he reports using his electric scooter for offloading. Wound is larger. 7/30; patient presents for follow-up. He has been using Vashe wet-to-dry dressings. He has taken Augmentin as prescribed without issues. Overall wound has healthier granulation tissue. 8/1; patient presents for follow-up. He presents for obligatory cast change. He tolerated the cast well and has no issues or complaints today. We have been using antibiotic ointment with Hydrofera Blue under the cast. Wound is slightly smaller. 8/8; patient presents for follow-up. We have been using antibiotic ointment with Hydrofera Blue under the total contact cast. Wound is smaller. He has no issues or complaints today. 8/13; patient presents for follow-up. We have been using antibiotic ointment with Hydrofera Blue under the total contact cast. He comes in today because  the top part of the cast over the foot has sunken as well as on the side. He wanted to assure that there were no issues. 8/22; patient presents for follow-up. He has been using Hydrofera Blue to the wound bed. Previously we have been using a total contact cast. We do not have total contact casts due to manufacturing issues available for patients. He has no issues or complaints. He denies signs of infection. 8/27; patient presents for follow-up. He has been using Vashe wet-to-dry dressings. T contact cast is available for placement today. He was agreeable to otal proceed with this. He denies signs of infection. 9/3; patient presents for follow-up. We have been using Hydrofera Blue with antibiotic ointment under the total contact cast for the past week. He has tolerated this well. The wound is smaller. Unfortunately we do not have a total contact cast in office today for replacement as there is been manufacturing issues. 9/5; patient was seen earlier this week and plan was for a total contact cast but there is none available. The cast finally arrived in patient has come in today to have this placed. We will continue with Hydrofera Blue and antibiotic ointment under the cast. 9/12; patient presents for follow-up. We have been using Hydrofera Blue and antibiotic ointment under the cast. Wound is stable. He denies signs of infection. 09/17/2023: The wound is measuring smaller today. The surface appears healthy and there is minimal accumulation of slough. 09/23/2023: The wound external measurements remain about the same, but it is shallower and visually appears smaller. 09/30/2023: The wound is covered with a layer of callus. Underneath the callus, there is a tiny superficial residual opening. 10/14/2023: He returns after being away for 2 weeks, during which he was out of a cast. Not surprisingly, his wound has reopened substantially. It remains clean, however. 10/21/2023: We have recovered most of what was  lost as far as progress with his absence from clinic. The wound did measure smaller today. It is a little bit macerated around the edges and he reports that he thinks some water got in over the top of his cast while showering. SYON, TEWS ([id number]) [id number] Page 9 of 14 10/28/2023: The wound measures smaller today. There is no tissue maceration. There is some slough on the surface and a little bit of callus accumulation around the edges. 11/04/2023: The wound measured a little bit deeper today and had some undermining, but this seems to be due to mainly to accumulated callus. The  surface has a bit of slough. He has been approved for Grafix so we will plan to apply that today. Patient History Information obtained from Patient. Family History Heart Disease - Mother,Father, No family history of Cancer, Diabetes, Hereditary Spherocytosis, Hypertension, Kidney Disease, Lung Disease, Seizures, Stroke, Thyroid Problems, Tuberculosis. Social History Never smoker, Marital Status - Single, Alcohol Use - Rarely, Drug Use - No History, Caffeine Use - Rarely. 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 82 Denies history of Angina, Arrhythmia, Deep Vein Thrombosis, Hypotension, Peripheral Arterial Disease, Peripheral Venous Disease, Phlebitis, Vasculitis Gastrointestinal Denies history of Cirrhosis , Colitis, Crohns, Hepatitis A,  Hepatitis B, Hepatitis C Endocrine Patient has history of Type II Diabetes Genitourinary Denies history of End Stage Renal Disease Immunological Denies history of Lupus Erythematosus, Raynauds, Scleroderma Integumentary (Skin) Denies history of History of Burn Musculoskeletal Denies history of Gout, Rheumatoid Arthritis, Osteoarthritis, Osteomyelitis Neurologic Patient has history of Neuropathy Denies history of Dementia, Quadriplegia, Paraplegia, Seizure Disorder Oncologic Denies history of Received Chemotherapy, Received Radiation Psychiatric Denies history of Anorexia/bulimia, Confinement Anxiety Hospitalization/Surgery History - Heart Cath in 2018. Medical A Surgical History Notes nd Cardiovascular Ischemic Cardiomyopathy Genitourinary Renal Insufficiency Objective Constitutional no acute distress. Vitals Time Taken: 7:45 AM, Height: 78 in, Weight: 275 lbs, BMI: 31.8, Temperature: 97.9 F, Pulse: 86 bpm, Respiratory Rate: 16 breaths/min, Blood Pressure: 126/89 mmHg. Respiratory Normal work of breathing on room air.. General Notes: 11/04/2023: The wound measured a little bit deeper today and had some undermining, but this seems to be due to mainly to accumulated callus. The surface has a bit of slough. Integumentary (Hair, Skin) Wound #3 status is Open. Original cause of wound was Gradually Appeared. The date acquired was: 10/01/2022. The wound has been in treatment 40 weeks. The wound is located on the Left,Plantar Foot. The wound measures 0.4cm length x 0.3cm width x 0.2cm depth; 0.094cm^2 area and 0.019cm^3 volume. There is Fat Layer (Subcutaneous Tissue) exposed. There is undermining starting at 12:00 and ending at 12:00 with a maximum distance of 0.2cm. There is a medium amount of serosanguineous drainage noted. The wound margin is thickened. There is large (67-100%) red, pink granulation within the wound bed. There is a small (1-33%) amount of necrotic tissue within the  wound bed including Adherent Slough. The periwound skin appearance had no abnormalities noted for LAREN, WHALING ([id number]) [phone].pdf Page 10 of 14 moisture. The periwound skin appearance had no abnormalities noted for color. The periwound skin appearance exhibited: Callus. The periwound skin appearance did not exhibit: Crepitus, Excoriation, Induration, Rash, Scarring. Periwound temperature was noted as No Abnormality. Assessment Active Problems ICD-10 Non-pressure chronic ulcer of other part of left foot with fat layer exposed Type 2 diabetes mellitus with foot ulcer Acquired absence of other left toe(s) Procedures Wound #3 Pre-procedure diagnosis of Wound #3 is a Diabetic Wound/Ulcer of the Lower Extremity located on the Left,Plantar Foot .Severity of Tissue Pre Debridement is: Fat layer exposed. There was a Excisional Skin/Subcutaneous Tissue Debridement with a total area of 0.09 sq cm performed by [name], MD. With the following instrument(s): Curette to remove Non-Viable tissue/material. Material removed includes Callus, Subcutaneous Tissue, Slough,  and Skin: Epidermis. No specimens were taken. A time out was conducted at 07:59, prior to the start of the procedure. A Minimum amount of bleeding was controlled with Pressure. The procedure was tolerated well. Post Debridement Measurements: 0.4cm length x 0.3cm width x 0.2cm depth; 0.019cm^3 volume. Character of Wound/Ulcer Post Debridement is improved. Severity of Tissue Post Debridement is: Fat layer exposed. Post procedure Diagnosis Wound #3: Same as Pre-Procedure Pre-procedure diagnosis of Wound #3 is a Diabetic Wound/Ulcer of the Lower Extremity located on the Left,Plantar Foot. A skin graft procedure using a bioengineered skin substitute/cellular or tissue based product was performed by [name], MD with the following instrument(s): Forceps and Scissors. Grafix prime was applied and  secured with Steri-Strips. 2 sq cm of product was utilized and 0 sq cm was wasted. Post Application, adaptic was applied. A Time Out was conducted at 08:02, prior to the start of the procedure. The procedure was tolerated well with a pain level of 0 throughout and a pain level of 0 following the procedure. Post procedure Diagnosis Wound #3: Same as Pre-Procedure . Pre-procedure diagnosis of Wound #3 is a Diabetic Wound/Ulcer of the Lower Extremity located on the Left,Plantar Foot . There was a T Contact Cast otal Procedure by [name], MD. Post procedure Diagnosis Wound #3: Same as Pre-Procedure Plan Follow-up Appointments: Return Appointment in 1 week. - Dr. Lady Gary - room 2 Cellular or Tissue Based Products: Cellular or Tissue Based Product Type: - Grafix prime #1 Cellular or Tissue Based Product applied to wound bed, secured with steri-strips, cover with Adaptic or Mepitel. (DO NOT REMOVE). Bathing/ Shower/ Hygiene: May shower with protection but do not get wound dressing(s) wet. Protect dressing(s) with water repellant cover (for example, large plastic bag) or a cast cover and may then take shower. Off-Loading: T Contact Cast to Left Lower Extremity - TCC size 3 heel cup to toes. foam pad to anterior ankle otal Removable cast walker boot to: - left foot WOUND #3: - Foot Wound Laterality: Plantar, Left Cleanser: Soap and Water 1 x Per Day/30 Days Discharge Instructions: May shower and wash wound with dial antibacterial soap and water prior to dressing change. Cleanser: Vashe 5.8 (oz) 1 x Per Day/30 Days Discharge Instructions: Cleanse the wound with Vashe prior to applying a clean dressing using gauze sponges, not tissue or cotton balls. Peri-Wound Care: Zinc Oxide Ointment 30g tube 1 x Per Day/30 Days Discharge Instructions: Apply Zinc Oxide to periwound with each dressing change Topical: Gentamicin 1 x Per Day/30 Days Discharge Instructions: As directed by  physician Topical: Mupirocin Ointment 1 x Per Day/30 Days Discharge Instructions: Apply Mupirocin (Bactroban) as instructed Prim Dressing: ADAPTIC TOUCH 3x4.25 (in/in) 1 x Per Day/30 Days ary Discharge Instructions: Apply to wound bed as instructed Secondary Dressing: Woven Gauze Sponge, Non-Sterile 4x4 in 1 x Per Day/30 Days Discharge Instructions: Apply over primary dressing as directed. Secondary Dressing: Zetuvit Plus 4x8 in 1 x Per Day/30 Days Discharge Instructions: Apply over primary dressing as directed. Secured With: [employer], 4.5x3.1 (in/yd) 1 x Per Day/30 Days Discharge Instructions: Secure with Kerlix as directed. 11/04/2023: The wound measured a little bit deeper today and had some undermining, but this seems to be due to mainly to accumulated callus. The surface has a bit of slough. He has been approved for Grafix so we will plan to apply that today. MAKO, PELFREY ([id number]) [id number] Page 11 of 14 I used a curette to debride skin, callus, slough, and subcutaneous tissue from  the wound. Topical gentamicin and mupirocin will Applied to the wound surface. Grafix prime was then applied and moistened. Periwound zinc oxide was applied and everything was secured using Adaptic, and Steri-Strips. A total contact cast was applied. He will follow-up in 1 week. Electronic Signature(s) Signed: 11/04/2023 12:44:09 PM By: Shawn Stall RN, BSN Signed: 11/04/2023 12:58:46 PM By: [name] MD FACS Previous Signature: 11/04/2023 8:12:41 AM Version By: [name] MD FACS Entered By: Shawn Stall on 11/04/2023 09:41:30 -------------------------------------------------------------------------------- HxROS Details Patient Name: Date of Service: [name] [place], [name] 11/04/2023 7:45 A M Medical Record Number: [id number] Patient Account Number: [id number] Date of Birth/Sex: Treating RN: [birth year] (52 y.o. M) Primary Care Provider: [name] Other Clinician: Referring Provider: Treating Provider/Extender: [name] in Treatment: 40 Information Obtained From Patient Eyes Medical History: Negative for: Cataracts; Glaucoma; Optic Neuritis Ear/Nose/Mouth/Throat Medical History: Positive for: Chronic sinus problems/congestion Negative for: Middle ear problems Hematologic/Lymphatic Medical History: Negative for: Anemia; Hemophilia; Human Immunodeficiency Virus; Lymphedema; Sickle Cell Disease Respiratory Medical History: Negative for: Aspiration; Asthma; Chronic Obstructive Pulmonary Disease (COPD); Pneumothorax; Sleep Apnea; Tuberculosis Cardiovascular Medical History: Positive for: Congestive Heart Failure; Coronary Artery Disease; Hypertension; Myocardial Infarction - age 68 Negative for: Angina; Arrhythmia; Deep Vein Thrombosis; Hypotension; Peripheral Arterial Disease; Peripheral Venous Disease; Phlebitis; Vasculitis Past Medical History Notes: Ischemic Cardiomyopathy Gastrointestinal Medical History: Negative for: Cirrhosis ; Colitis; Crohns; Hepatitis A; Hepatitis B; Hepatitis C Endocrine Medical History: Positive for: Type II Diabetes Time with diabetes: ten years Treated with: Insulin Blood sugar tested every day: No Genitourinary Medical History: Negative for: End Stage Renal Disease CARLYLE, MCELRATH ([id number]) [id number] Page 12 of 14 Past Medical History Notes: Renal Insufficiency Immunological Medical History: Negative for: Lupus Erythematosus; Raynauds; Scleroderma Integumentary (Skin) Medical History: Negative for: History of Burn Musculoskeletal Medical History: Negative for: Gout; Rheumatoid Arthritis; Osteoarthritis; Osteomyelitis Neurologic Medical History: Positive for: Neuropathy Negative for: Dementia; Quadriplegia; Paraplegia; Seizure Disorder Oncologic Medical History: Negative for: Received Chemotherapy; Received  [facility] Medical History: Negative for: Anorexia/bulimia; Confinement Anxiety HBO Extended History Items Ear/Nose/Mouth/Throat: Chronic sinus problems/congestion Immunizations Pneumococcal Vaccine: Received Pneumococcal Vaccination: No Implantable Devices None Hospitalization / Surgery History Type of Hospitalization/Surgery Heart Cath in 2018 Family and Social History Cancer: No; Diabetes: No; Heart Disease: Yes - Mother,Father; Hereditary Spherocytosis: No; Hypertension: No; Kidney Disease: No; Lung Disease: No; Seizures: No; Stroke: No; Thyroid Problems: No; Tuberculosis: No; Never smoker; Marital Status - Single; Alcohol Use: Rarely; Drug Use: No History; Caffeine Use: Rarely; Financial Concerns: No; Food, Clothing or Shelter Needs: No; Support System Lacking: No; Transportation Concerns: No Electronic Signature(s) Signed: 11/04/2023 8:45:54 AM By: [name] MD FACS Entered By: [name] on 11/04/2023 05:10:42 -------------------------------------------------------------------------------- Total Contact Cast Details Patient Name: Date of Service: [name] [place], [name] 11/04/2023 7:45 A M Medical Record Number: [id number] Patient Account Number: [id number] Date of Birth/Sex: Treating RN: 1971/02/12 (52 y.o. [name] Primary Care Provider: [name] Other Clinician: Referring Provider: Treating Provider/Extender: [name] in Treatment: [address], [place] ([id number]) [id number] Page 13 of 14 T Contact Cast Applied for Wound Assessment: otal Wound #3 Left,Plantar Foot Performed By: Physician [name], MD The following information was scribed by: [name] The information was scribed for: [name] Post Procedure Diagnosis Same as Pre-procedure Electronic Signature(s) Signed: 11/04/2023 8:45:54 AM By: [name] MD FACS Signed: 11/04/2023 3:30:49  PM By: [name] Entered By: [name] on 11/04/2023 05:02:22 -------------------------------------------------------------------------------- SuperBill Details Patient Name: Date  of Service: [name] [place], [name] RO LD 11/04/2023 Medical Record Number: [id number] Patient Account Number: [id number] Date of Birth/Sex: Treating RN: 07-12-1971 (52 y.o. 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.621 Type 2 diabetes mellitus with foot ulcer Z89.422 Acquired absence of other left  toe(s) Modifier: Quantity: 2 : CPT4 Code: 63875643 Description: 15275 - SKIN SUB GRAFT FACE/NK/HF/G ICD-10 Diagnosis Description L97.522 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.621 Type 2 diabetes mellitus with foot ulcer Z89.422 Acquired absence of other left  toe(s) Modifier: Quantity: 1 Physician Procedures : CPT4 Code Description Modifier [id number] 99214 - WC PHYS LEVEL 4 - EST PT ICD-10 Diagnosis Description L97.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 [place], [name] ([id number]) [id number] Page 14 of 14 Signed: 11/05/2023 9:54:18 AM By: Pearletha Alfred Signed: 11/05/2023 10:15:10 AM By: [name] MD FACS Previous Signature: 11/04/2023 8:13:03 AM Version By: [name] MD FACS Entered By: Pearletha Alfred on 11/05/2023 06:54:17

## 2023-11-04 NOTE — Progress Notes (Addendum)
GABRIAN, DUDDY ([id number]) [id number] Page 1 of 8 Visit Report for 11/04/2023 Arrival Information Details Patient Name: Date of Service: [name] [place], [name] RO LD 11/04/2023 7:45 A M Medical Record Number: [id number] Patient Account Number: [id number] Date of Birth/Sex: Treating RN: [birth year] (52 y.o. [name] Primary Care [name]: [name] Other Clinician: Referring Shaneca Orne: Treating Mykala Mccready/Extender: [name] in Treatment: 40 Visit Information History Since Last Visit Added or deleted any medications: No Patient Arrived: Ambulatory Any new allergies or adverse reactions: No Arrival Time: 07:41 Had a fall or experienced change in No Accompanied By: self activities of daily living that may affect Transfer Assistance: None risk of falls: Patient Identification Verified: Yes Signs or symptoms of abuse/neglect since last visito No Secondary Verification Process Completed: Yes Hospitalized since last visit: No Patient Requires Transmission-Based Precautions: No Implantable device outside of the clinic excluding No Patient Has Alerts: Yes cellular tissue based products placed in the center Patient Alerts: 08/2021 ABI:1.2 TBI 0.79 since last visit: Has Dressing in Place as Prescribed: Yes Has Footwear/Offloading in Place as Prescribed: Yes Left: T Contact Cast otal Pain Present Now: No Electronic Signature(s) Signed: 11/04/2023 3:30:49 PM By: [name] Entered By: [name] on 11/04/2023 07:45:40 -------------------------------------------------------------------------------- Complex / Palliative Patient Assessment Details Patient Name: Date of Service: [name], [name] 11/04/2023 7:45 A M Medical Record Number: [id number] Patient Account Number: [id number] Date of Birth/Sex: Treating RN: 01-08-1971 (52 y.o. [name] Primary Care Markus Casten: [name] Other  Clinician: Referring Lochlin Eppinger: Treating Lexington Krotz/Extender: [name] in Treatment: 40 Complex Wound Management Criteria Patient is unable to adhere to an Advanced Wound Management Plan. Agreem to Receive Wound Care has been implemented. Patient demonstrates continued inability to adhere to an Advanced Wound Management Plan, ent the patient has been presented with the option of continuing the Advanced Plan or implementing the Complex Wound Management Plan. Patient or healthcare surrogate must agree to Complex Wound Management Plan. Palliative Wound Management Criteria Care Approach Wound Care Plan: Complex Wound Management Notes patient requested to be out of the cast and go on vacation which made the wound regress due to patient not adequately offloading Electronic Signature(s) Signed: 11/07/2023 11:04:04 AM By: [name] MD FACS Signed: 11/07/2023 12:10:28 PM By: Candiss Norse, [name] ([id number]) [id number] Page 2 of 8 Entered By: [name] on 11/07/2023 09:25:38 -------------------------------------------------------------------------------- Encounter Discharge Information Details Patient Name: Date of Service: [name] [place], [name] RO LD 11/04/2023 7:45 A M Medical Record Number: [id number] Patient Account Number: [id number] Date of Birth/Sex: Treating RN: January 02, 1971 (52 y.o. [name] Primary Care Orlandus Borowski: [name] Other Clinician: Referring Jamall Strohmeier: Treating Taneka Espiritu/Extender: [name] in Treatment: 40 Encounter Discharge Information Items Post Procedure Vitals Discharge Condition: Stable Temperature (F): 97.9 Ambulatory Status: Ambulatory Pulse (bpm): 86 Discharge Destination: Home Respiratory Rate (breaths/min): 16 Transportation: Private Auto Blood Pressure (mmHg): 126/89 Accompanied By: self Schedule Follow-up Appointment: Yes Clinical  Summary of Care: Patient Declined Electronic Signature(s) Signed: 11/04/2023 3:30:49 PM By: [name] Entered By: [name] on 11/04/2023 08:28:04 -------------------------------------------------------------------------------- Lower Extremity Assessment Details Patient Name: Date of Service: [name] [place], [name] 11/04/2023 7:45 A M Medical Record Number: [id number] Patient Account Number: [id number] Date of Birth/Sex: Treating RN: Nov 04, 1971 (52 y.o. [name] Primary Care Lelia Jons: [name] Other Clinician: Referring Zaidyn Claire: Treating Kaitlyne Friedhoff/Extender: [name] in Treatment: 40 Edema Assessment Assessed: [name]: No] [name]: No] Edema: [Left: N] [Right:  o] Calf Left: Right: Point of Measurement: 43 cm From Medial Instep 37.5 cm Ankle Left: Right: Point of Measurement: 9 cm From Medial Instep 21.8 cm Vascular Assessment Pulses: Dorsalis Pedis Palpable: [Left:Yes] Extremity colors, hair growth, and conditions: Extremity Color: [Left:Normal] Hair Growth on Extremity: [Left:Yes] Temperature of Extremity: [Left:Warm] Capillary Refill: [Left:< 3 seconds] Dependent Rubor: [Left:No] Lipodermatosclerosis: [Left:No] [Right:[id number] Page 3 of 8] Electronic Signature(s) Signed: 11/04/2023 3:30:49 PM By: [name] Entered By: [name] on 11/04/2023 07:52:17 -------------------------------------------------------------------------------- Multi Wound Chart Details Patient Name: Date of Service: [name] [place], [name] 11/04/2023 7:45 A M Medical Record Number: [id number] Patient Account Number: [id number] Date of Birth/Sex: Treating RN: July 19, 1971 (52 y.o. M) Primary Care Ferlin Fairhurst: [name] Other Clinician: Referring Lillyan Hitson: Treating Lanise Mergen/Extender: [name] in Treatment: 40 Vital Signs Height(in): 78 Pulse(bpm): 86 Weight(lbs):  275 Blood Pressure(mmHg): 126/89 Body Mass Index(BMI): 31.8 Temperature(F): 97.9 Respiratory Rate(breaths/min): 16 [3:Photos:] [N/A:N/A] Left, Plantar Foot N/A N/A Wound Location: Gradually Appeared N/A N/A Wounding Event: Diabetic Wound/Ulcer of the Lower N/A N/A Primary Etiology: Extremity Chronic sinus problems/congestion, N/A N/A Comorbid History: Congestive Heart Failure, Coronary Artery Disease, Hypertension, Myocardial Infarction, Type II Diabetes, Neuropathy 10/01/2022 N/A N/A Date Acquired: 40 N/A N/A Weeks of Treatment: Open N/A N/A Wound Status: No N/A N/A Wound Recurrence: Yes N/A N/A Pending [name] on Presentation: 0.4x0.3x0.2 N/A N/A Measurements L x W x D (cm) 0.094 N/A N/A A (cm) : rea 0.019 N/A N/A Volume (cm) : 97.60% N/A N/A % Reduction in A rea: 97.60% N/A N/A % Reduction in Volume: 12 Starting Position 1 (o'clock): 12 Ending Position 1 (o'clock): 0.2 Maximum Distance 1 (cm): Yes N/A N/A Undermining: Grade 1 N/A N/A Classification: Medium N/A N/A Exudate A mount: Serosanguineous N/A N/A Exudate Type: red, brown N/A N/A Exudate Color: Thickened N/A N/A Wound Margin: Large (67-100%) N/A N/A Granulation A mount: Red, Pink N/A N/A Granulation Quality: Small (1-33%) N/A N/A Necrotic A mount: Fat Layer (Subcutaneous Tissue): Yes N/A N/A Exposed Structures: Fascia: No Tendon: No Muscle: No Joint: No Bone: No Medium (34-66%) N/A N/A EpithelializationGEOVANNY, PEDREGON ([id number]) [id number] Page 4 of 8 Debridement - Excisional N/A N/A Debridement: 07:59 N/A N/A Pre-procedure Verification/Time Out Taken: Callus, Subcutaneous, Slough N/A N/A Tissue Debrided: Skin/Subcutaneous Tissue N/A N/A Level: 0.09 N/A N/A Debridement A (sq cm): rea Curette N/A N/A Instrument: Minimum N/A N/A Bleeding: Pressure N/A N/A Hemostasis A chieved: Procedure was tolerated well N/A N/A Debridement  Treatment Response: 0.4x0.3x0.2 N/A N/A Post Debridement Measurements L x W x D (cm) 0.019 N/A N/A Post Debridement Volume: (cm) Callus: Yes N/A N/A Periwound Skin Texture: Excoriation: No Induration: No Crepitus: No Rash: No Scarring: No Maceration: Yes N/A N/A Periwound Skin Moisture: Dry/Scaly: No Atrophie Blanche: No N/A N/A Periwound Skin Color: Cyanosis: No Ecchymosis: No Erythema: No Hemosiderin Staining: No Mottled: No Pallor: No Rubor: No No Abnormality N/A N/A Temperature: Cellular or Tissue Based Product N/A N/A Procedures Performed: Debridement T Contact Cast otal Treatment Notes Electronic Signature(s) Signed: 11/04/2023 8:09:32 AM By: [name] MD FACS Entered By: [name] on 11/04/2023 08:09:32 -------------------------------------------------------------------------------- Multi-Disciplinary Care Plan Details Patient Name: Date of Service: [name], [name] 11/04/2023 7:45 A M Medical Record Number: [id number] Patient Account Number: [id number] Date of Birth/Sex: Treating RN: [birth year] (52 y.o. [name] Primary Care Manroop Jakubowicz: [name] Other Clinician: Referring Vedant Shehadeh: Treating Tamaya Pun/Extender: [name] in Treatment: 40 Active Inactive Wound/Skin Impairment Nursing Diagnoses: Impaired tissue  integrity Knowledge deficit related to ulceration/compromised skin integrity Goals: Patient will have a decrease in wound volume by X% from date: (specify in notes) Date Initiated: 01/24/2023 Target Resolution Date: 12/30/2023 Goal Status: Active Patient/caregiver will verbalize understanding of skin care regimen Date Initiated: 01/24/2023 Target Resolution Date: 12/30/2023 Goal Status: Active Ulcer/skin breakdown will have a volume reduction of 30% by week 4 Date Initiated: 01/24/2023 Date Inactivated: 03/07/2023 Target Resolution Date: 02/27/2023 Goal Status: Unmet Unmet Reason:  larger today. Ulcer/skin breakdown will have a volume reduction of 50% by week 8 Date Initiated: 01/24/2023 Target Resolution Date: 12/30/2023 TRINIDAD, WICKER ([id number]) [id number] Page 5 of 8 Goal Status: Active Interventions: Assess patient/caregiver ability to obtain necessary supplies Assess patient/caregiver ability to perform ulcer/skin care regimen upon admission and as needed Assess ulceration(s) every visit Notes: Electronic Signature(s) Signed: 11/04/2023 3:30:49 PM By: [name] Entered By: [name] on 11/04/2023 08:05:28 -------------------------------------------------------------------------------- Pain Assessment Details Patient Name: Date of Service: [name] [place], [name] 11/04/2023 7:45 A M Medical Record Number: [id number] Patient Account Number: [id number] Date of Birth/Sex: Treating RN: 11-16-1971 (52 y.o. [name] Primary Care Jenavee Laguardia: [name] Other Clinician: Referring Starlette Thurow: Treating Maeryn Mcgath/Extender: [name] in Treatment: 40 Active Problems Location of Pain Severity and Description of Pain Patient Has Paino No Site Locations Rate the pain. Current Pain Level: 0 Pain Management and Medication Current Pain Management: Electronic Signature(s) Signed: 11/04/2023 3:30:49 PM By: [name] Entered By: [name] on 11/04/2023 07:41:38 -------------------------------------------------------------------------------- Patient/Caregiver Education Details Patient Name: Date of Service: [name] [place], [name] 11/5/2024andnbsp7:45 A M Medical Record Number: [id number] Patient Account Number: [id number] Date of Birth/Gender: Treating RN: 1971-04-30 (52 y.o. [name] [place], [name] ([id number]) [id number] Page 6 of 8 Primary Care Physician: [name] Other Clinician: Referring Physician: Treating  Physician/Extender: [name] in Treatment: 40 Education Assessment Education Provided To: Patient Education Topics Provided Wound/Skin Impairment: Methods: Explain/Verbal Responses: Reinforcements needed, State content correctly Electronic Signature(s) Signed: 11/04/2023 3:30:49 PM By: [name] Entered By: [name] on 11/04/2023 08:05:37 -------------------------------------------------------------------------------- Wound Assessment Details Patient Name: Date of Service: [name] [place], [name] 11/04/2023 7:45 A M Medical Record Number: [id number] Patient Account Number: [id number] Date of Birth/Sex: Treating RN: 1971/11/24 (52 y.o. [name] Primary Care Courtney Bellizzi: [name] Other Clinician: Referring Zaidan Keeble: Treating Xzayvier Fagin/Extender: [name] in Treatment: 40 Wound Status Wound Number: 3 Primary Diabetic Wound/Ulcer of the Lower Extremity Etiology: Wound Location: Left, Plantar Foot Wound Open Wounding Event: Gradually Appeared Status: Date Acquired: 10/01/2022 Comorbid Chronic sinus problems/congestion, Congestive Heart Failure, Weeks Of Treatment: 40 History: Coronary Artery Disease, Hypertension, Myocardial Infarction, Type Clustered Wound: No II Diabetes, Neuropathy Pending Amputation On Presentation Wound under treatment by Nikkita Adeyemi outside of Wound Center Photos Wound Measurements Length: (cm) 0.4 Width: (cm) 0.3 Depth: (cm) 0.2 Area: (cm) 0.094 Volume: (cm) 0.019 % Reduction in Area: 97.6% % Reduction in Volume: 97.6% Epithelialization: Medium (34-66%) Undermining: Yes Starting Position (o'clock): 12 Ending Position (o'clock): 12 Maximum Distance: (cm) 0.2 Wound Description Classification: Grade 1 Woessner, Euriah ([id number]) Wound Margin: Thickened Exudate Amount: Medium Exudate Type: Serosanguineous Exudate Color: red, brown Foul Odor After Cleansing:  No [id number] Page 7 of 8 Slough/Fibrino Yes Wound Bed Granulation Amount: Large (67-100%) Exposed Structure Granulation Quality: Red, Pink Fascia Exposed: No Necrotic Amount: Small (1-33%) Fat Layer (Subcutaneous Tissue) Exposed: Yes Necrotic Quality: Adherent Slough Tendon Exposed: No Muscle Exposed: No Joint Exposed: No Bone Exposed: No  Periwound Skin Texture Texture Color No Abnormalities Noted: No No Abnormalities Noted: Yes Callus: Yes Temperature / Pain Crepitus: No Temperature: No Abnormality Excoriation: No Induration: No Rash: No Scarring: No Moisture No Abnormalities Noted: Yes Treatment Notes Wound #3 (Foot) Wound Laterality: Plantar, Left Cleanser Soap and Water Discharge Instruction: May shower and wash wound with dial antibacterial soap and water prior to dressing change. Vashe 5.8 (oz) Discharge Instruction: Cleanse the wound with Vashe prior to applying a clean dressing using gauze sponges, not tissue or cotton balls. Peri-Wound Care Zinc Oxide Ointment 30g tube Discharge Instruction: Apply Zinc Oxide to periwound with each dressing change Topical Gentamicin Discharge Instruction: As directed by physician Mupirocin Ointment Discharge Instruction: Apply Mupirocin (Bactroban) as instructed Primary Dressing ADAPTIC TOUCH 3x4.25 (in/in) Discharge Instruction: Apply to wound bed as instructed Secondary Dressing Woven Gauze Sponge, Non-Sterile 4x4 in Discharge Instruction: Apply over primary dressing as directed. Zetuvit Plus 4x8 in Discharge Instruction: Apply over primary dressing as directed. Secured With [employer], 4.5x3.1 (in/yd) Discharge Instruction: Secure with Kerlix as directed. Compression Wrap Compression Stockings Add-Ons Electronic Signature(s) Signed: 11/04/2023 3:30:49 PM By: [name] Entered By: [name] on 11/04/2023 07:57:13 [name] ([id number])  [id number] Page 8 of 8 -------------------------------------------------------------------------------- Vitals Details Patient Name: Date of Service: [name] [place], [name] RO LD 11/04/2023 7:45 A M Medical Record Number: [id number] Patient Account Number: [id number] Date of Birth/Sex: Treating RN: 1971-01-23 (52 y.o. [name] Primary Care Page Pucciarelli: [name] Other Clinician: Referring Domenic Schoenberger: Treating Lakecia Deschamps/Extender: [name] in Treatment: 40 Vital Signs Time Taken: 07:45 Temperature (F): 97.9 Height (in): 78 Pulse (bpm): 86 Weight (lbs): 275 Respiratory Rate (breaths/min): 16 Body Mass Index (BMI): 31.8 Blood Pressure (mmHg): 126/89 Reference Range: 80 - 120 mg / dl Electronic Signature(s) Signed: 11/04/2023 3:30:49 PM By: [name] Entered By: [name] on 11/04/2023 07:45:53

## 2023-11-07 ENCOUNTER — Other Ambulatory Visit: Payer: Self-pay

## 2023-11-10 ENCOUNTER — Other Ambulatory Visit: Payer: Self-pay

## 2023-11-10 ENCOUNTER — Encounter (HOSPITAL_BASED_OUTPATIENT_CLINIC_OR_DEPARTMENT_OTHER): Payer: 59 | Admitting: General Surgery

## 2023-11-10 ENCOUNTER — Other Ambulatory Visit (HOSPITAL_COMMUNITY): Payer: Self-pay

## 2023-11-10 DIAGNOSIS — I130 Hypertensive heart and chronic kidney disease with heart failure and stage 1 through stage 4 chronic kidney disease, or unspecified chronic kidney disease: Secondary | ICD-10-CM | POA: Diagnosis not present

## 2023-11-10 DIAGNOSIS — L97522 Non-pressure chronic ulcer of other part of left foot with fat layer exposed: Secondary | ICD-10-CM | POA: Diagnosis not present

## 2023-11-10 DIAGNOSIS — E11621 Type 2 diabetes mellitus with foot ulcer: Secondary | ICD-10-CM | POA: Diagnosis not present

## 2023-11-10 NOTE — Progress Notes (Signed)
PRENTICE, [place]MAN ([id number]) [id number] Page 1 of 13 Visit Report for 11/10/2023 Chief Complaint Document Details Patient Name: Date of Service: [name] [place], [name] RO LD 11/10/2023 7:45 A M Medical Record Number: [id number] Patient Account Number: [id number] Date of Birth/Sex: Treating RN: [birth year] (52 y.o. M) Primary Care Provider: [name] Other Clinician: Referring Provider: Treating Provider/Extender: [name] in Treatment: 13 Information Obtained from: Patient Chief Complaint 01/15/2023; left foot wound Electronic Signature(s) Signed: 11/10/2023 8:15:12 AM By: [name] MD FACS Entered By: [name] on 11/10/2023 05:15:12 -------------------------------------------------------------------------------- Cellular or Tissue Based Product Details Patient Name: Date of Service: [name] [place], [name] 11/10/2023 7:45 A M Medical Record Number: [id number] Patient Account Number: [id number] Date of Birth/Sex: Treating RN: [birth year] (52 y.o. [name] Primary Care Provider: [name] Other Clinician: Referring Provider: Treating Provider/Extender: [name] in Treatment: [phone] Cellular or Tissue Based Product Type Wound #3 Left,Plantar Foot Applied to: Performed By: Physician [name], MD The following information was scribed by: [name] The information was scribed for: [name] Cellular or Tissue Based Product Type: Grafix prime Level of Consciousness (Pre-procedure): Awake and Alert Pre-procedure Verification/Time Out Yes - 08:06 Taken: Location: genitalia / hands / feet / multiple digits Wound Size (sq cm): 0.04 Product Size (sq cm): 2 Waste Size (sq cm): 0 Amount of Product Applied (sq cm): 2 Instrument Used: Forceps, Scissors Lot #: [id number] Expiration Date: 04/06/2024 Fenestrated: No Reconstituted: Yes Solution Type:  normal saline Solution Amount: 2 ml Lot #: [id number] KS Solution Expiration Date: 04/19/2025 Secured: Yes Secured With: Steri-Strips Dressing Applied: Yes Primary Dressing: adaptic Procedural Pain: 0 Post Procedural Pain: 0 Response to Treatment: Procedure was tolerated well [name] ([id number]) [id number] Page 2 of 13 Level of Consciousness (Post- Awake and Alert procedure): Post Procedure Diagnosis Same as Pre-procedure Electronic Signature(s) Signed: 11/10/2023 10:06:17 AM By: [name] MD FACS Signed: 11/10/2023 4:15:11 PM By: [name] Entered By: [name] on 11/10/2023 05:07:56 -------------------------------------------------------------------------------- Debridement Details Patient Name: Date of Service: [name] [place], [name] 11/10/2023 7:45 A M Medical Record Number: [id number] Patient Account Number: [id number] Date of Birth/Sex: Treating RN: [birth year] (52 y.o. [name] Primary Care Provider: [name] Other Clinician: Referring Provider: Treating Provider/Extender: [name] in Treatment: 70 Debridement Performed for Assessment: Wound #3 Left,Plantar Foot Performed By: Physician [name], MD The following information was scribed by: [name] The information was scribed for: [name] Debridement Type: Debridement Severity of Tissue Pre Debridement: Fat layer exposed Level of Consciousness (Pre-procedure): Awake and Alert Pre-procedure Verification/Time Out Yes - 08:05 Taken: Start Time: 08:05 Pain Control: Lidocaine 4% Topical Solution Percent of Wound Bed Debrided: 200% T Area Debrided (cm): otal 0.06 Tissue and other material debrided: Non-Viable, Callus, Slough, Subcutaneous, Slough Level: Skin/Subcutaneous Tissue Debridement Description: Excisional Instrument: Curette Bleeding: Minimum Hemostasis Achieved:  Pressure Response to Treatment: Procedure was tolerated well Level of Consciousness (Post- Awake and Alert procedure): Post Debridement Measurements of Total Wound Length: (cm) 0.2 Width: (cm) 0.2 Depth: (cm) 0.2 Volume: (cm) 0.006 Character of Wound/Ulcer Post Debridement: Improved Severity of Tissue Post Debridement: Fat layer exposed Post Procedure Diagnosis Same as Pre-procedure Electronic Signature(s) Signed: 11/10/2023 10:06:17 AM By: [name] MD FACS Signed: 11/10/2023 4:15:11 PM By: [name] Entered By: [name] on 11/10/2023 05:06:15 [name] ([id number]) [id number] Page 3 of 13 -------------------------------------------------------------------------------- HPI Details Patient Name: Date of Service: [name] [place], [name] RO LD 11/10/2023 7:45  A M Medical Record Number: [id number] Patient Account Number: [id number] Date of Birth/Sex: Treating RN: 1971/05/16 (52 y.o. M) Primary Care Provider: [name] Other Clinician: Referring Provider: Treating Provider/Extender: [name] in Treatment: 85 History of Present Illness HPI Description: ADMISSION 01/11/2020 This is a 52 year old man with uncontrolled type 2 diabetes with a recent hemoglobin A1c earlier this year of 13.4. He is on insulin and glipizide. He does not take his blood sugars at home. He does have a follow-up with primary care later this month I believe on January 27. He tells [place] that roughly a month ago he was walking with a shoe with a hole in his foot. He took the shoe off and there was an open wound at roughly the left fourth met head. This has significant undermining and raised edges. He has not noticed any purulence he does not feel unwell. More recently he was taking skin off the bottom of his foot and has a superficial area on the left fifth met head. He has not been offloading this. The patient was in the ER on  12/20. They gave him Bactroban which she has been using on the wound and 10 days worth of doxycycline. No x-rays were done. He has not had vascular studies. He is also been using hydrogen peroxide. Past medical history type 2 diabetes uncontrolled, chronic systolic heart failure, coronary artery disease with a history of congestive heart failure with stents. Hypertension hyperlipidemia and chronic renal insufficiency ABI in this clinic was 1.14 on the left. 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 He is uncertain whether he would be able to work if we put him on some form of restriction 1/19; he is generally doing quite well. Using silver alginate on the wounds. Things actually look better. He has a forefoot offloading boot which she seems to be compliant about. He has support at work to stay off his foot is much as possible which is gratifying. Culture I did last week showed a few Enterococcus faecalis. I am going to put him on Augmentin. I talked about ordering an x-ray in my note last week but that does not seem to have happened. We will review reorder the x-ray this week. 1/26; x-ray reordered last week was negative for osteomyelitis. We are using silver alginate on the wound on the third and fifth met heads on the left. He is using a Darco forefoot off loader 2/2; the area on the fifth met head is closed. Third met head is still open with tunneling depth and thick callus. 2/9; the area on the fifth met head remains closed however the third met head again has a small open area on presentation with tunneling in depth and surrounded by thick callus. This looks like a pressure issue. We have been using silver alginate 2/16; the area of the fifth metatarsal head remains closed however the area over the third metatarsal head again is a small open area but with some depth. I do not think this is changed much since last week. He is using Hydrofera Blue with forefoot off  loader. He is not able to use a total contact cast on the left leg because he needs his left leg at work [employer]]. Fortunately the wound does not look infected. I changed him to endoform today 2/26; the area of the fifth metatarsal head remains closed. The area of the third metatarsal head has an even smaller opening this time.  I used endoform on this this looks improved. He is offloading this is much as he can and a forefoot off loader on the left. He cannot have a total contact cast because of work responsibilities 3/5; the area on the fifth metatarsal head remains closed the area on the third metatarsal head is also closed on the left foot. 01/15/2023 Mr. 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 He has been following for podiatry for several months for this issue. He is currently using wet-to-dry saline dressings. He currently denies signs of infection. [place] note from 1/17; Patient presents with a 44-month history of nonhealing ulcer to the left foot secondary to diabetes and inability to offload well the area. He has had multiple debridements in the past to his feet bilaterally. He has had resection of the fourth left met head in the fifth toe Secondary to osteomyelitis. We discussed the importance of glycemic control for wound healing. Due to his blood glucose levels being elevated he is at high risk for infection and thus further amputation. He expressed understanding. He states he is supposed to be referred to an endocrinologist at [facility] however the referral fell through. Unclear what happened. Offered a referral to endocrinology at  [facility]. Patient was agreeable.Furthermore we discussed the importance of aggressive offloading for his wound healing. This will be the most difficult part of the treatment plan for the patient to do. We discussed a total contact cast however he has declined that at this time. 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 He is currently using a surgical shoe with a peg assist. It does not fit well so we will give him a new one today. I do not think this is enough offloading. If he is not able to offload this area he will likely end up with a BKA. He is well aware of this. For now I recommended Medihoney and Hydrofera Blue for dressing changes. He will follow-up in our [place] office Since this is closer for him. 1/26; patient presents for follow-up. Last clinic visit I had seen him in [place] and we transferred him to [place] since this is a closer location for him. Progress note above from that visit. Over the past week He has been using Medihoney and Hydrofera Blue to the wound bed. He has been using his surgical shoe with peg assist. He has no issues or complaints today. We discussed doing the total contact cast and he was agreeable to having this placed at next clinic visit. 2/2; patient presents for follow-up. He has been using Vashe wet-to-dry dressings. Plan is for the total contact cast today. 2/5; patient presents for follow-up. The cast was placed 3 days ago. He tolerated this well however had a lot of drainage. He currently denies signs of infection. 2/9; patient presents for follow-up. At last clinic visit we held off on replacing the cast due to drainage. He has been using Vashe wet-to-dry dressings and he is currently taking the oral antibiotics prescribed without issues. He declines a total contact cast today. We ran insurance verification for skin substitute and  due to cost patient declines having this  placed. 2/16; patient presents for follow-up. He has decided not to follow-up with podiatry. He continues to decline the total contact cast. He has been using Vashe wet- to-dry dressings. He has not been wearing his surgical shoe with peg assist. He currently denies signs of infection. 3/1; patient presents for follow-up. He has been doing Vashe wet-to-dry dressings. He has a surgical shoe with peg assist. We discussed potentially doing a skin substitute for which she has been approved for by insurance. He knows the out-of-pocket cost of this and would still like to proceed with having this placed today. He denies the current total contact cast. 3/8; patient presents for follow-up. He states that the skin substitute came off after a few days and he has been using Vashe wet-to-dry dressings. He has mild odor to the wound bed on exam. [name], [name] ([id number]) [id number] Page 4 of 13 3/15; patient presents for follow-up. He has been using Vashe wet-to-dry dressings and started his course of Augmentin. He reports improvement in wound healing. He has no issues or complaints today. 3/21; patient presents for follow-up. He continues to use Vashe wet-to-dry dressings. He has no issues or complaints today. 3/28; patient presents for follow-up. He has been using Vashe wet-to-dry dressings. He has no issues or complaints today. He has been using his peg assist with surgical shoe. 4/11; patient presents for follow-up. He has been using Vashe wet-to-dry dressings. The periwound is macerated. He has been using his peg assist with surgical shoe. He has no issues or complaints today. 4/25; patient presents for follow-up. Has been using Vashe wet-to-dry dressings. He has been using zinc oxide to the periwound. For offloading he has been using his peg assist in surgical shoe. He has no issues or complaints today. 5/9; patient presents for follow-up. He has been using Vashe wet-to-dry  dressings. He has no issues or complaints today. He has a surgical shoe with peg assist for offloading. 5/23; patient presents for follow up. He has been using Vashe to clean the wound bed and collagen and blast X with dressing changes. He went to the beach for 2 weeks. Wound is bigger. 5/31; patient presents for follow-up. He has been using Vashe wet-to-dry dressings. Wound is stable but with healthier granulation tissue. Patient would like to hold off on the total contact cast and try a knee scooter for offloading. 6/13; patient presents for follow-up. He has been using Vashe wet-to-dry dressings. He has been using a knee scooter for offloading however states he felt unstable on this and decided to buy an electric scooter. This arrived in the mail today. He would like to hold off on the left total contact cast. Wound is slightly smaller today. 6/27; patient presents for follow-up. He has been using Vashe wet-to-dry dressings. He is using an Art gallery manager for mobility to help relieve the pressure off the wound bed. Wound is smaller today. He wants to hold off on doing the total contact cast as he is showing signs of improvement with wound healing. 7/11; patient presents for follow-up. He has been using Vashe wet-to-dry dressings and his electric scooter for offloading. Wound is slightly smaller. 7/25; patient presents for follow up. He has been using Vashe wet-to-dry dressings and he reports using his electric scooter for offloading. Wound is larger. 7/30; patient presents for follow-up. He has been using Vashe wet-to-dry dressings. He has taken Augmentin as prescribed without issues. Overall wound has healthier granulation tissue. 8/1; patient  presents for follow-up. He presents for obligatory cast change. He tolerated the cast well and has no issues or complaints today. We have been using antibiotic ointment with Hydrofera Blue under the cast. Wound is slightly smaller. 8/8; patient presents  for follow-up. We have been using antibiotic ointment with Hydrofera Blue under the total contact cast. Wound is smaller. He has no issues or complaints today. 8/13; patient presents for follow-up. We have been using antibiotic ointment with Hydrofera Blue under the total contact cast. He comes in today because the top part of the cast over the foot has sunken as well as on the side. He wanted to assure that there were no issues. 8/22; patient presents for follow-up. He has been using Hydrofera Blue to the wound bed. Previously we have been using a total contact cast. We do not have total contact casts due to manufacturing issues available for patients. He has no issues or complaints. He denies signs of infection. 8/27; patient presents for follow-up. He has been using Vashe wet-to-dry dressings. T contact cast is available for placement today. He was agreeable to otal proceed with this. He denies signs of infection. 9/3; patient presents for follow-up. We have been using Hydrofera Blue with antibiotic ointment under the total contact cast for the past week. He has tolerated this well. The wound is smaller. Unfortunately we do not have a total contact cast in office today for replacement as there is been manufacturing issues. 9/5; patient was seen earlier this week and plan was for a total contact cast but there is none available. The cast finally arrived in patient has come in today to have this placed. We will continue with Hydrofera Blue and antibiotic ointment under the cast. 9/12; patient presents for follow-up. We have been using Hydrofera Blue and antibiotic ointment under the cast. Wound is stable. He denies signs of infection. 09/17/2023: The wound is measuring smaller today. The surface appears healthy and there is minimal accumulation of slough. 09/23/2023: The wound external measurements remain about the same, but it is shallower and visually appears smaller. 09/30/2023: The wound is covered  with a layer of callus. Underneath the callus, there is a tiny superficial residual opening. 10/14/2023: He returns after being away for 2 weeks, during which he was out of a cast. Not surprisingly, his wound has reopened substantially. It remains clean, however. 10/21/2023: We have recovered most of what was lost as far as progress with his absence from clinic. The wound did measure smaller today. It is a little bit macerated around the edges and he reports that he thinks some water got in over the top of his cast while showering. 10/28/2023: The wound measures smaller today. There is no tissue maceration. There is some slough on the surface and a little bit of callus accumulation around the edges. 11/04/2023: The wound measured a little bit deeper today and had some undermining, but this seems to be due to mainly to accumulated callus. The surface has a bit of slough. He has been approved for Grafix so we will plan to apply that today. 11/10/2023: He has responded beautifully to Grafix application. His wound is down to just a couple of millimeters. There is some slough on the surface. Electronic Signature(s) Signed: 11/10/2023 8:15:52 AM By: [name] MD FACS Entered By: [name] on 11/10/2023 05:15:52 [name] ([id number]) [id number] Page 5 of 13 -------------------------------------------------------------------------------- Physical Exam Details Patient Name: Date of Service: [name] [place], [name] RO LD 11/10/2023 7:45 A M  Medical Record Number: [id number] Patient Account Number: [id number] Date of Birth/Sex: Treating RN: 1971/05/01 (53 y.o. M) Primary Care Provider: [name] Other Clinician: Referring Provider: Treating Provider/Extender: [name] in Treatment: 41 Constitutional . . . . no acute distress. Respiratory Normal work of breathing on room air.. Notes 11/10/2023: He has responded beautifully  to Grafix application. His wound is down to just a couple of millimeters. There is some slough on the surface. Electronic Signature(s) Signed: 11/10/2023 8:23:26 AM By: [name] MD FACS Previous Signature: 11/10/2023 8:18:28 AM Version By: [name] MD FACS Entered By: [name] on 11/10/2023 05:23:26 -------------------------------------------------------------------------------- Physician Orders Details Patient Name: Date of Service: [name] [place], [name] 11/10/2023 7:45 A M Medical Record Number: [id number] Patient Account Number: [id number] Date of Birth/Sex: Treating RN: 28-Jun-1971 (52 y.o. [name] Primary Care Provider: [name] Other Clinician: Referring Provider: Treating Provider/Extender: [name] in Treatment: 13 The following information was scribed by: [name] The information was scribed for: [name] Verbal / Phone Orders: No Diagnosis Coding ICD-10 Coding Code Description [phone] 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.621 Type 2 diabetes mellitus with foot ulcer Z89.422 Acquired absence of other left toe(s) Follow-up Appointments ppointment in 1 week. - Dr. Lady Gary - room 2 Return A Cellular or Tissue Based Products Cellular or Tissue Based Product Type: - Grafix prime #2 Cellular or Tissue Based Product applied to wound bed, secured with steri-strips, cover with Adaptic or Mepitel. (DO NOT REMOVE). Bathing/ Shower/ Hygiene May shower with protection but do not get wound dressing(s) wet. Protect dressing(s) with water repellant cover (for example, large plastic bag) or a cast cover and may then take shower. Off-Loading Total Contact Cast to Left Lower Extremity - TCC size 3 heel cup to toes. foam pad to anterior ankle Removable cast walker boot to: - left foot CLAYSON, DEUBEL ([id number]) [id number] Page  6 of 13 Wound Treatment Wound #3 - Foot Wound Laterality: Plantar, Left Cleanser: Soap and Water 1 x Per Day/30 Days Discharge Instructions: May shower and wash wound with dial antibacterial soap and water prior to dressing change. Cleanser: Vashe 5.8 (oz) 1 x Per Day/30 Days Discharge Instructions: Cleanse the wound with Vashe prior to applying a clean dressing using gauze sponges, not tissue or cotton balls. Peri-Wound Care: Zinc Oxide Ointment 30g tube 1 x Per Day/30 Days Discharge Instructions: Apply Zinc Oxide to periwound with each dressing change Topical: Gentamicin 1 x Per Day/30 Days Discharge Instructions: As directed by physician Topical: Mupirocin Ointment 1 x Per Day/30 Days Discharge Instructions: Apply Mupirocin (Bactroban) as instructed Prim Dressing: ADAPTIC TOUCH 3x4.25 (in/in) 1 x Per Day/30 Days ary Discharge Instructions: Apply to wound bed as instructed Secondary Dressing: Woven Gauze Sponge, Non-Sterile 4x4 in 1 x Per Day/30 Days Discharge Instructions: Apply over primary dressing as directed. Secondary Dressing: Zetuvit Plus 4x8 in 1 x Per Day/30 Days Discharge Instructions: Apply over primary dressing as directed. Secured With: [employer], 4.5x3.1 (in/yd) 1 x Per Day/30 Days Discharge Instructions: Secure with Kerlix as directed. Electronic Signature(s) Signed: 11/10/2023 10:06:17 AM By: [name] MD FACS Entered By: [name] on 11/10/2023 05:24:41 -------------------------------------------------------------------------------- Problem List Details Patient Name: Date of Service: [name] [place], [name] 11/10/2023 7:45 A M Medical Record Number: [id number] Patient Account Number: [id number] Date of Birth/Sex: Treating RN: 1971/11/22 (52 y.o. M) Primary Care Provider: [name] Other Clinician: Referring Provider:  Treating Provider/Extender: [name] in Treatment: 69 Active  Problems ICD-10 Encounter Code Description Active Date MDM Diagnosis L97.522 Non-pressure chronic ulcer of other part of left foot with fat layer exposed 01/24/2023 No Yes E11.621 Type 2 diabetes mellitus with foot ulcer 01/24/2023 No Yes Z89.422 Acquired absence of other left toe(s) 01/24/2023 No Yes Inactive Problems Resolved Problems WAID, DOLSON ([id number]) [id number] Page 7 of 13 Electronic Signature(s) Signed: 11/10/2023 8:14:43 AM By: [name] MD FACS Entered By: [name] on 11/10/2023 05:14:43 -------------------------------------------------------------------------------- Progress Note Details Patient Name: Date of Service: [name] [place], [name] 11/10/2023 7:45 A M Medical Record Number: [id number] Patient Account Number: [id number] Date of Birth/Sex: Treating RN: Feb 07, 1971 (52 y.o. M) Primary Care Provider: [name] Other Clinician: Referring Provider: Treating Provider/Extender: [name] in Treatment: 88 Subjective Chief Complaint Information obtained from Patient 01/15/2023; left foot wound History of Present Illness (HPI) ADMISSION 01/11/2020 This is a 52 year old man with uncontrolled type 2 diabetes with a recent hemoglobin A1c earlier this year of 13.4. He is on insulin and glipizide. He does not take his blood sugars at home. He does have a follow-up with primary care later this month I believe on January 27. He tells [place] that roughly a month ago he was walking with a shoe with a hole in his foot. He took the shoe off and there was an open wound at roughly the left fourth met head. This has significant undermining and raised edges. He has not noticed any purulence he does not feel unwell. More recently he was taking skin off the bottom of his foot and has a superficial area on the left fifth met head. He has not been offloading this. The patient was in the ER on 12/20. They gave him  Bactroban which she has been using on the wound and 10 days worth of doxycycline. No x-rays were done. He has not had vascular studies. He is also been using hydrogen peroxide. Past medical history type 2 diabetes uncontrolled, chronic systolic heart failure, coronary artery disease with a history of congestive heart failure with stents. Hypertension hyperlipidemia and chronic renal insufficiency ABI in this clinic was 1.14 on the left. 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 He is uncertain whether he would be able to work if we put him on some form of restriction 1/19; he is generally doing quite well. Using silver alginate on the wounds. Things actually look better. He has a forefoot offloading boot which she seems to be compliant about. He has support at work to stay off his foot is much as possible which is gratifying. Culture I did last week showed a few Enterococcus faecalis. I am going to put him on Augmentin. I talked about ordering an x-ray in my note last week but that does not seem to have happened. We will review reorder the x-ray this week. 1/26; x-ray reordered last week was negative for osteomyelitis. We are using silver alginate on the wound on the third and fifth met heads on the left. He is using a Darco forefoot off loader 2/2; the area on the fifth met head is closed. Third met head is still open with tunneling depth and thick callus. 2/9; the area on the fifth met head remains closed however the third met head again has a small open area on presentation with tunneling in depth and surrounded by thick callus. This looks like a pressure issue.  We have been using silver alginate 2/16; the area of the fifth metatarsal head remains closed however the area over the third metatarsal head again is a small open area but with some depth. I do not think this is changed much since last week. He is using Hydrofera Blue with forefoot off loader. He is not able  to use a total contact cast on the left leg because he needs his left leg at work [employer]]. Fortunately the wound does not look infected. I changed him to endoform today 2/26; the area of the fifth metatarsal head remains closed. The area of the third metatarsal head has an even smaller opening this time. I used endoform on this this looks improved. He is offloading this is much as he can and a forefoot off loader on the left. He cannot have a total contact cast because of work responsibilities 3/5; the area on the fifth metatarsal head remains closed the area on the third metatarsal head is also closed on the left foot. 01/15/2023 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 He has been following for podiatry for several months for this issue. He is currently using wet-to-dry saline dressings. He currently denies signs of infection. [place] note from 1/17; Patient presents with a 62-month history of nonhealing ulcer to the left foot secondary to diabetes and inability to offload well the area. He has had multiple debridements in the past to his feet bilaterally. He has had resection of the fourth left met head in the fifth toe Secondary to osteomyelitis. We discussed the importance of glycemic control for wound healing. Due to his blood glucose levels being elevated he is at high risk for infection and thus further amputation. He expressed understanding. He states he is supposed to be referred to an endocrinologist at [facility] however the referral fell through. Unclear what happened. Offered a referral to endocrinology at [facility]. Patient was  agreeable.Furthermore we discussed the importance of aggressive offloading for his wound healing. This will be the most difficult part of the treatment plan for the patient to do. We discussed a total contact cast however he has declined that at this time. 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 He is currently using a surgical shoe with a peg assist. It does not fit well so we will give him a new one today. I do not think this is enough offloading. If he is not able to offload this area he will likely end up with a BKA. He is well aware of this. For now I recommended Medihoney and Hydrofera Blue for dressing changes. He will follow-up in our [place] office Since this is closer for him. 1/26; patient presents for follow-up. Last clinic visit I had seen him in [place] and we transferred him to [place] since this is a closer location for him. Progress note above from that visit. Over the past week He has been using Medihoney and Hydrofera Blue to the wound bed. He has been using his surgical shoe with peg assist. He has no issues or complaints today. We discussed doing  the total contact cast and he was agreeable to having this placed at next [name], [name] ([id number]) [phone].pdf Page 8 of 13 clinic visit. 2/2; patient presents for follow-up. He has been using Vashe wet-to-dry dressings. Plan is for the total contact cast today. 2/5; patient presents for follow-up. The cast was placed 3 days ago. He tolerated this well however had a lot of drainage. He currently denies signs of infection. 2/9; patient presents for follow-up. At last clinic visit we held off on replacing the cast due to drainage. He has been using Vashe wet-to-dry dressings and he is currently taking the oral antibiotics prescribed without issues. He declines a total contact cast today. We ran insurance verification for skin  substitute and due to cost patient declines having this placed. 2/16; patient presents for follow-up. He has decided not to follow-up with podiatry. He continues to decline the total contact cast. He has been using Vashe wet- to-dry dressings. He has not been wearing his surgical shoe with peg assist. He currently denies signs of infection. 3/1; patient presents for follow-up. He has been doing Vashe wet-to-dry dressings. He has a surgical shoe with peg assist. We discussed potentially doing a skin substitute for which she has been approved for by insurance. He knows the out-of-pocket cost of this and would still like to proceed with having this placed today. He denies the current total contact cast. 3/8; patient presents for follow-up. He states that the skin substitute came off after a few days and he has been using Vashe wet-to-dry dressings. He has mild odor to the wound bed on exam. 3/15; patient presents for follow-up. He has been using Vashe wet-to-dry dressings and started his course of Augmentin. He reports improvement in wound healing. He has no issues or complaints today. 3/21; patient presents for follow-up. He continues to use Vashe wet-to-dry dressings. He has no issues or complaints today. 3/28; patient presents for follow-up. He has been using Vashe wet-to-dry dressings. He has no issues or complaints today. He has been using his peg assist with surgical shoe. 4/11; patient presents for follow-up. He has been using Vashe wet-to-dry dressings. The periwound is macerated. He has been using his peg assist with surgical shoe. He has no issues or complaints today. 4/25; patient presents for follow-up. Has been using Vashe wet-to-dry dressings. He has been using zinc oxide to the periwound. For offloading he has been using his peg assist in surgical shoe. He has no issues or complaints today. 5/9; patient presents for follow-up. He has been using Vashe wet-to-dry dressings. He has no  issues or complaints today. He has a surgical shoe with peg assist for offloading. 5/23; patient presents for follow up. He has been using Vashe to clean the wound bed and collagen and blast X with dressing changes. He went to the beach for 2 weeks. Wound is bigger. 5/31; patient presents for follow-up. He has been using Vashe wet-to-dry dressings. Wound is stable but with healthier granulation tissue. Patient would like to hold off on the total contact cast and try a knee scooter for offloading. 6/13; patient presents for follow-up. He has been using Vashe wet-to-dry dressings. He has been using a knee scooter for offloading however states he felt unstable on this and decided to buy an electric scooter. This arrived in the mail today. He would like to hold off on the left total contact cast. Wound is slightly smaller today. 6/27; patient presents for follow-up. He has been using Vashe wet-to-dry dressings. He  is using an electric scooter for mobility to help relieve the pressure off the wound bed. Wound is smaller today. He wants to hold off on doing the total contact cast as he is showing signs of improvement with wound healing. 7/11; patient presents for follow-up. He has been using Vashe wet-to-dry dressings and his electric scooter for offloading. Wound is slightly smaller. 7/25; patient presents for follow up. He has been using Vashe wet-to-dry dressings and he reports using his electric scooter for offloading. Wound is larger. 7/30; patient presents for follow-up. He has been using Vashe wet-to-dry dressings. He has taken Augmentin as prescribed without issues. Overall wound has healthier granulation tissue. 8/1; patient presents for follow-up. He presents for obligatory cast change. He tolerated the cast well and has no issues or complaints today. We have been using antibiotic ointment with Hydrofera Blue under the cast. Wound is slightly smaller. 8/8; patient presents for follow-up. We have  been using antibiotic ointment with Hydrofera Blue under the total contact cast. Wound is smaller. He has no issues or complaints today. 8/13; patient presents for follow-up. We have been using antibiotic ointment with Hydrofera Blue under the total contact cast. He comes in today because the top part of the cast over the foot has sunken as well as on the side. He wanted to assure that there were no issues. 8/22; patient presents for follow-up. He has been using Hydrofera Blue to the wound bed. Previously we have been using a total contact cast. We do not have total contact casts due to manufacturing issues available for patients. He has no issues or complaints. He denies signs of infection. 8/27; patient presents for follow-up. He has been using Vashe wet-to-dry dressings. T contact cast is available for placement today. He was agreeable to otal proceed with this. He denies signs of infection. 9/3; patient presents for follow-up. We have been using Hydrofera Blue with antibiotic ointment under the total contact cast for the past week. He has tolerated this well. The wound is smaller. Unfortunately we do not have a total contact cast in office today for replacement as there is been manufacturing issues. 9/5; patient was seen earlier this week and plan was for a total contact cast but there is none available. The cast finally arrived in patient has come in today to have this placed. We will continue with Hydrofera Blue and antibiotic ointment under the cast. 9/12; patient presents for follow-up. We have been using Hydrofera Blue and antibiotic ointment under the cast. Wound is stable. He denies signs of infection. 09/17/2023: The wound is measuring smaller today. The surface appears healthy and there is minimal accumulation of slough. 09/23/2023: The wound external measurements remain about the same, but it is shallower and visually appears smaller. 09/30/2023: The wound is covered with a layer of  callus. Underneath the callus, there is a tiny superficial residual opening. 10/14/2023: He returns after being away for 2 weeks, during which he was out of a cast. Not surprisingly, his wound has reopened substantially. It remains clean, however. 10/21/2023: We have recovered most of what was lost as far as progress with his absence from clinic. The wound did measure smaller today. It is a little bit macerated around the edges and he reports that he thinks some water got in over the top of his cast while showering. [name], [name] ([id number]) [id number] Page 9 of 13 10/28/2023: The wound measures smaller today. There is no tissue maceration. There is some slough on the surface and  a little bit of callus accumulation around the edges. 11/04/2023: The wound measured a little bit deeper today and had some undermining, but this seems to be due to mainly to accumulated callus. The surface has a bit of slough. He has been approved for Grafix so we will plan to apply that today. 11/10/2023: He has responded beautifully to Grafix application. His wound is down to just a couple of millimeters. There is some slough on the surface. Patient History Information obtained from Patient. Family History Heart Disease - Mother,Father, No family history of Cancer, Diabetes, Hereditary Spherocytosis, Hypertension, Kidney Disease, Lung Disease, Seizures, Stroke, Thyroid Problems, Tuberculosis. Social History Never smoker, Marital Status - Single, Alcohol Use - Rarely, Drug Use - No History, Caffeine Use - Rarely. Medical History Eyes Denies history of Cataracts, Glaucoma, Optic Neuritis Ear/Nose/Mouth/Throat Patient has history of Chronic sinus problems/congestion Denies history of Middle ear problems Hematologic/Lymphatic Denies history of Anemia, Hemophilia, Human Immunodeficiency Virus, Lymphedema, Sickle Cell Disease Respiratory Denies history of Aspiration, Asthma, Chronic  Obstructive Pulmonary Disease (COPD), Pneumothorax, Sleep Apnea, Tuberculosis Cardiovascular Patient has history of Congestive Heart Failure, Coronary Artery Disease, Hypertension, Myocardial Infarction - age 85 Denies history of Angina, Arrhythmia, Deep Vein Thrombosis, Hypotension, Peripheral Arterial Disease, Peripheral Venous Disease, Phlebitis, Vasculitis Gastrointestinal Denies history of Cirrhosis , Colitis, Crohns, Hepatitis A, Hepatitis B, Hepatitis C Endocrine Patient has history of Type II Diabetes Genitourinary Denies history of End Stage Renal Disease Immunological Denies history of Lupus Erythematosus, Raynauds, Scleroderma Integumentary (Skin) Denies history of History of Burn Musculoskeletal Denies history of Gout, Rheumatoid Arthritis, Osteoarthritis, Osteomyelitis Neurologic Patient has history of Neuropathy Denies history of Dementia, Quadriplegia, Paraplegia, Seizure Disorder Oncologic Denies history of Received Chemotherapy, Received Radiation Psychiatric Denies history of Anorexia/bulimia, Confinement Anxiety Hospitalization/Surgery History - Heart Cath in 2018. Medical A Surgical History Notes nd Cardiovascular Ischemic Cardiomyopathy Genitourinary Renal Insufficiency Objective Constitutional no acute distress. Vitals Time Taken: 7:49 AM, Height: 78 in, Weight: 275 lbs, BMI: 31.8, Temperature: 97.8 F, Pulse: 88 bpm, Respiratory Rate: 18 breaths/min, Blood Pressure: 112/80 mmHg. Respiratory Normal work of breathing on room air.. General Notes: 11/10/2023: He has responded beautifully to Grafix application. His wound is down to just a couple of millimeters. There is some slough on the surface. Integumentary (Hair, Skin) Wound #3 status is Open. Original cause of wound was Gradually Appeared. The date acquired was: 10/01/2022. The wound has been in treatment 41 weeks. The wound is located on the Left,Plantar Foot. The wound measures 0.2cm length x 0.2cm  width x 0.2cm depth; 0.031cm^2 area and 0.006cm^3 volume. There [name], [name] ([id number]) [id number] Page 10 of 13 is Fat Layer (Subcutaneous Tissue) exposed. There is no tunneling or undermining noted. There is a medium amount of serosanguineous drainage noted. The wound margin is thickened. There is large (67-100%) red granulation within the wound bed. There is no necrotic tissue within the wound bed. The periwound skin appearance had no abnormalities noted for moisture. The periwound skin appearance had no abnormalities noted for color. The periwound skin appearance exhibited: Callus. The periwound skin appearance did not exhibit: Crepitus, Excoriation, Induration, Rash, Scarring. Periwound temperature was noted as No Abnormality. Assessment Active Problems ICD-10 Non-pressure chronic ulcer of other part of left foot with fat layer exposed Type 2 diabetes mellitus with foot ulcer Acquired absence of other left toe(s) Procedures Wound #3 Pre-procedure diagnosis of Wound #3 is a Diabetic Wound/Ulcer of the Lower Extremity located on the Left,Plantar Foot .Severity of Tissue Pre Debridement is: Fat layer exposed.  There was a Excisional Skin/Subcutaneous Tissue Debridement with a total area of 0.06 sq cm performed by [name], MD. With the following instrument(s): Curette to remove Non-Viable tissue/material. Material removed includes Callus, Subcutaneous Tissue, and Slough after achieving pain control using Lidocaine 4% T opical Solution. No specimens were taken. A time out was conducted at 08:05, prior to the start of the procedure. A Minimum amount of bleeding was controlled with Pressure. The procedure was tolerated well. Post Debridement Measurements: 0.2cm length x 0.2cm width x 0.2cm depth; 0.006cm^3 volume. Character of Wound/Ulcer Post Debridement is improved. Severity of Tissue Post Debridement is: Fat layer exposed. Post procedure Diagnosis  Wound #3: Same as Pre-Procedure Pre-procedure diagnosis of Wound #3 is a Diabetic Wound/Ulcer of the Lower Extremity located on the Left,Plantar Foot. A skin graft procedure using a bioengineered skin substitute/cellular or tissue based product was performed by [name], MD with the following instrument(s): Forceps and Scissors. Grafix prime was applied and secured with Steri-Strips. 2 sq cm of product was utilized and 0 sq cm was wasted. Post Application, adaptic was applied. A Time Out was conducted at 08:06, prior to the start of the procedure. The procedure was tolerated well with a pain level of 0 throughout and a pain level of 0 following the procedure. Post procedure Diagnosis Wound #3: Same as Pre-Procedure . Pre-procedure diagnosis of Wound #3 is a Diabetic Wound/Ulcer of the Lower Extremity located on the Left,Plantar Foot . There was a T Contact Cast otal Procedure by [name], MD. Post procedure Diagnosis Wound #3: Same as Pre-Procedure Plan Follow-up Appointments: Return Appointment in 1 week. - Dr. Lady Gary - room 2 Cellular or Tissue Based Products: Cellular or Tissue Based Product Type: - Grafix prime #2 Cellular or Tissue Based Product applied to wound bed, secured with steri-strips, cover with Adaptic or Mepitel. (DO NOT REMOVE). Bathing/ Shower/ Hygiene: May shower with protection but do not get wound dressing(s) wet. Protect dressing(s) with water repellant cover (for example, large plastic bag) or a cast cover and may then take shower. Off-Loading: T Contact Cast to Left Lower Extremity - TCC size 3 heel cup to toes. foam pad to anterior ankle otal Removable cast walker boot to: - left foot WOUND #3: - Foot Wound Laterality: Plantar, Left Cleanser: Soap and Water 1 x Per Day/30 Days Discharge Instructions: May shower and wash wound with dial antibacterial soap and water prior to dressing change. Cleanser: Vashe 5.8 (oz) 1 x Per Day/30 Days Discharge  Instructions: Cleanse the wound with Vashe prior to applying a clean dressing using gauze sponges, not tissue or cotton balls. Peri-Wound Care: Zinc Oxide Ointment 30g tube 1 x Per Day/30 Days Discharge Instructions: Apply Zinc Oxide to periwound with each dressing change Topical: Gentamicin 1 x Per Day/30 Days Discharge Instructions: As directed by physician Topical: Mupirocin Ointment 1 x Per Day/30 Days Discharge Instructions: Apply Mupirocin (Bactroban) as instructed Prim Dressing: ADAPTIC TOUCH 3x4.25 (in/in) 1 x Per Day/30 Days ary Discharge Instructions: Apply to wound bed as instructed Secondary Dressing: Woven Gauze Sponge, Non-Sterile 4x4 in 1 x Per Day/30 Days Discharge Instructions: Apply over primary dressing as directed. Secondary Dressing: Zetuvit Plus 4x8 in 1 x Per Day/30 Days Discharge Instructions: Apply over primary dressing as directed. Secured With: [employer], 4.5x3.1 (in/yd) 1 x Per Day/30 Days Discharge Instructions: Secure with Kerlix as directed. [name], [name] ([id number]) [id number] Page 11 of 13 11/10/2023: He has responded beautifully to Grafix application. His wound is down to just  a couple of millimeters. There is some slough on the surface. I used a curette to debride callus, slough, and subcutaneous tissue from the wound. I then applied topical gentamicin mixed with mupirocin followed by Grafix which was moistened with saline. Zinc oxide was applied to the periwound and everything was secured in place with Adaptic and Steri-Strips. A total contact cast was then applied in standard fashion. Follow-up in 1 week. Electronic Signature(s) Signed: 11/10/2023 8:25:28 AM By: [name] MD FACS Entered By: [name] on 11/10/2023 05:25:28 -------------------------------------------------------------------------------- HxROS Details Patient Name: Date of Service: [name] IN, [name] 11/10/2023 7:45 A M Medical  Record Number: [id number] Patient Account Number: [id number] Date of Birth/Sex: Treating RN: 1971/07/31 (52 y.o. M) Primary Care Provider: [name] Other Clinician: Referring Provider: Treating Provider/Extender: [name] in Treatment: 69 Information Obtained From Patient Eyes Medical History: Negative for: Cataracts; Glaucoma; Optic Neuritis Ear/Nose/Mouth/Throat Medical History: Positive for: Chronic sinus problems/congestion Negative for: Middle ear problems Hematologic/Lymphatic Medical History: Negative for: Anemia; Hemophilia; Human Immunodeficiency Virus; Lymphedema; Sickle Cell Disease Respiratory Medical History: Negative for: Aspiration; Asthma; Chronic Obstructive Pulmonary Disease (COPD); Pneumothorax; Sleep Apnea; Tuberculosis Cardiovascular Medical History: Positive for: Congestive Heart Failure; Coronary Artery Disease; Hypertension; Myocardial Infarction - age 50 Negative for: Angina; Arrhythmia; Deep Vein Thrombosis; Hypotension; Peripheral Arterial Disease; Peripheral Venous Disease; Phlebitis; Vasculitis Past Medical History Notes: Ischemic Cardiomyopathy Gastrointestinal Medical History: Negative for: Cirrhosis ; Colitis; Crohns; Hepatitis A; Hepatitis B; Hepatitis C Endocrine Medical History: Positive for: Type II Diabetes Time with diabetes: ten years Treated with: Insulin Blood sugar tested every day: No Genitourinary Medical HistoryARPIT, [name] ([id number]) [phone].pdf Page 12 of 13 Negative for: End Stage Renal Disease Past Medical History Notes: Renal Insufficiency Immunological Medical History: Negative for: Lupus Erythematosus; Raynauds; Scleroderma Integumentary (Skin) Medical History: Negative for: History of Burn Musculoskeletal Medical History: Negative for: Gout; Rheumatoid Arthritis; Osteoarthritis; Osteomyelitis Neurologic Medical History: Positive for:  Neuropathy Negative for: Dementia; Quadriplegia; Paraplegia; Seizure Disorder Oncologic Medical History: Negative for: Received Chemotherapy; Received Radiation Psychiatric Medical History: Negative for: Anorexia/bulimia; Confinement Anxiety HBO Extended History Items Ear/Nose/Mouth/Throat: Chronic sinus problems/congestion Immunizations Pneumococcal Vaccine: Received Pneumococcal Vaccination: No Implantable Devices None Hospitalization / Surgery History Type of Hospitalization/Surgery Heart Cath in 2018 Family and Social History Cancer: No; Diabetes: No; Heart Disease: Yes - Mother,Father; Hereditary Spherocytosis: No; Hypertension: No; Kidney Disease: No; Lung Disease: No; Seizures: No; Stroke: No; Thyroid Problems: No; Tuberculosis: No; Never smoker; Marital Status - Single; Alcohol Use: Rarely; Drug Use: No History; Caffeine Use: Rarely; Financial Concerns: No; Food, Clothing or Shelter Needs: No; Support System Lacking: No; Transportation Concerns: No Electronic Signature(s) Signed: 11/10/2023 10:06:17 AM By: [name] MD FACS Entered By: [name] on 11/10/2023 05:18:21 -------------------------------------------------------------------------------- Total Contact Cast Details Patient Name: Date of Service: [name] [place], [name] 11/10/2023 7:45 A M Medical Record Number: [id number] Patient Account Number: [id number] Date of Birth/Sex: Treating RN: Oct 20, 1971 (52 y.o. [name] Primary Care Provider: [name] Other Clinician: Referring Provider: Treating Provider/Extender: [name], [name] ([id number]) [id number] Page 13 of 13 Weeks in Treatment: 41 T Contact Cast Applied for Wound Assessment: otal Wound #3 Left,Plantar Foot Performed By: Physician [name], MD The following information was scribed by: [name] The information was scribed for: [name] Post Procedure Diagnosis Same as Pre-procedure Electronic Signature(s) Signed: 11/10/2023 10:06:17 AM By: [name] MD FACS Signed: 11/10/2023 4:15:11 PM By: [name] Entered By: [name] on  11/10/2023 05:06:25 -------------------------------------------------------------------------------- SuperBill Details Patient Name: Date of Service: [name] [place], [name] RO LD 11/10/2023 Medical Record Number: [id number] Patient Account Number: [id number] Date of Birth/Sex: Treating RN: Feb 17, 1971 (52 y.o. 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) Facility Procedures : CPT4 Code: 25956387 Description: Q4133- Grafix PL 16 mm disc (2 units) Modifier: Quantity: 2 : CPT4 Code: 56433295 1 Description: 5275 - SKIN SUB GRAFT FACE/NK/HF/G ICD-10 Diagnosis Description L97.522 Non-pressure chronic ulcer of other part of left foot with fat layer exposed Modifier: Quantity: 1 Physician Procedures : CPT4 Code Description Modifier [id number] 99214 - WC PHYS LEVEL 4 - EST PT ICD-10 Diagnosis Description 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

## 2023-11-10 NOTE — Progress Notes (Signed)
TISHAUN, Glenn ([id number]) [id number] Page 1 of 8 Visit Report for 11/10/2023 Arrival Information Details Patient Name: Date of Service: [name] [place], [name] RO LD 11/10/2023 7:45 A M Medical Record Number: [id number] Patient Account Number: [id number] Date of Birth/Sex: Treating RN: 1971-11-03 (52 y.o. [name] Primary Care Mikah Rottinghaus: [name] Other Clinician: Referring Verdun Rackley: Treating Valentine Barney/Extender: [name] in Treatment: 15 Visit Information History Since Last Visit Added or deleted any medications: No Patient Arrived: Ambulatory Any new allergies or adverse reactions: No Arrival Time: 07:48 Had a fall or experienced change in No Accompanied By: self activities of daily living that may affect Transfer Assistance: None risk of falls: Patient Identification Verified: Yes Signs or symptoms of abuse/neglect since last visito No Secondary Verification Process Completed: Yes Hospitalized since last visit: No Patient Requires Transmission-Based Precautions: No Implantable device outside of the clinic excluding No Patient Has Alerts: Yes cellular tissue based products placed in the center Patient Alerts: 08/2021 ABI:1.2 TBI 0.79 since last visit: Has Dressing in Place as Prescribed: Yes Has Footwear/Offloading in Place as Prescribed: Yes Left: T Contact Cast otal Pain Present Now: No Electronic Signature(s) Signed: 11/10/2023 4:15:11 PM By: [name] Entered By: [name] on 11/10/2023 04:48:23 -------------------------------------------------------------------------------- Encounter Discharge Information Details Patient Name: Date of Service: [name] [place], [name] 11/10/2023 7:45 A M Medical Record Number: [id number] Patient Account Number: [id number] Date of Birth/Sex: Treating RN: 1971-04-28 (52 y.o. [name] Primary Care Chanz Cahall: [name] Other  Clinician: Referring Logen Heintzelman: Treating Demond Shallenberger/Extender: [name] in Treatment: [phone] Encounter Discharge Information Items Post Procedure Vitals Discharge Condition: Stable Temperature (F): 97.8 Ambulatory Status: Ambulatory Pulse (bpm): 88 Discharge Destination: Home Respiratory Rate (breaths/min): 18 Transportation: Private Auto Blood Pressure (mmHg): 112/80 Accompanied By: self Schedule Follow-up Appointment: Yes Clinical Summary of Care: Patient Declined Electronic Signature(s) Signed: 11/10/2023 4:15:11 PM By: [name] Entered By: [name] on 11/10/2023 05:09:12 [name] ([id number]) [id number] Page 2 of 8 -------------------------------------------------------------------------------- Lower Extremity Assessment Details Patient Name: Date of Service: [name] [place], [name] RO LD 11/10/2023 7:45 A M Medical Record Number: [id number] Patient Account Number: [id number] Date of Birth/Sex: Treating RN: 16-Feb-1971 (52 y.o. [name] Primary Care Mckinzie Saksa: [name] Other Clinician: Referring Laina Guerrieri: Treating Racer Quam/Extender: [name] in Treatment: 41 Edema Assessment Assessed: [name]: No] [name]: No] Edema: [Left: N] [Right: o] Calf Left: Right: Point of Measurement: 43 cm From Medial Instep 36.4 cm Ankle Left: Right: Point of Measurement: 9 cm From Medial Instep 21.3 cm Vascular Assessment Pulses: Dorsalis Pedis Palpable: [Left:Yes] Extremity colors, hair growth, and conditions: Extremity Color: [Left:Normal] Hair Growth on Extremity: [Left:Yes] Temperature of Extremity: [Left:Warm] Capillary Refill: [Left:< 3 seconds] Dependent Rubor: [Left:No No] Electronic Signature(s) Signed: 11/10/2023 4:15:11 PM By: [name] Entered By: [name] on 11/10/2023  04:56:47 -------------------------------------------------------------------------------- Multi Wound Chart Details Patient Name: Date of Service: [name] [place], [name] 11/10/2023 7:45 A M Medical Record Number: [id number] Patient Account Number: [id number] Date of Birth/Sex: Treating RN: 12-12-1971 (52 y.o. M) Primary Care Luciann Gossett: [name] Other Clinician: Referring Wang Granada: Treating [name]/Extender: [name] in Treatment: 41 Vital Signs Height(in): 78 Pulse(bpm): 88 Weight(lbs): 275 Blood Pressure(mmHg): 112/80 Body Mass Index(BMI): 31.8 Temperature(F): 97.8 Respiratory Rate(breaths/min): 18 [3:Photos:] [N/A:N/A] Left, Plantar Foot N/A N/A Wound Location: Gradually Appeared N/A N/A Wounding Event: Diabetic Wound/Ulcer of the Lower N/A N/A Primary Etiology: Extremity Chronic sinus problems/congestion, N/A N/A Comorbid History: Congestive Heart Failure,  Coronary Artery Disease, Hypertension, Myocardial Infarction, Type II Diabetes, Neuropathy 10/01/2022 N/A N/A Date Acquired: 78 N/A N/A Weeks of Treatment: Open N/A N/A Wound Status: No N/A N/A Wound Recurrence: Yes N/A N/A Pending [name] on Presentation: 0.2x0.2x0.2 N/A N/A Measurements L x W x D (cm) 0.031 N/A N/A A (cm) : rea 0.006 N/A N/A Volume (cm) : 99.20% N/A N/A % Reduction in A rea: 99.20% N/A N/A % Reduction in Volume: Grade 1 N/A N/A Classification: Medium N/A N/A Exudate A mount: Serosanguineous N/A N/A Exudate Type: red, brown N/A N/A Exudate Color: Thickened N/A N/A Wound Margin: Large (67-100%) N/A N/A Granulation A mount: Red N/A N/A Granulation Quality: None Present (0%) N/A N/A Necrotic A mount: Fat Layer (Subcutaneous Tissue): Yes N/A N/A Exposed Structures: Fascia: No Tendon: No Muscle: No Joint: No Bone: No Large (67-100%) N/A N/A Epithelialization: Debridement - Excisional N/A N/A Debridement: Pre-procedure  Verification/Time Out 08:05 N/A N/A Taken: Lidocaine 4% Topical Solution N/A N/A Pain Control: Callus, Subcutaneous, Slough N/A N/A Tissue Debrided: Skin/Subcutaneous Tissue N/A N/A Level: 0.06 N/A N/A Debridement A (sq cm): rea Curette N/A N/A Instrument: Minimum N/A N/A Bleeding: Pressure N/A N/A Hemostasis A chieved: Procedure was tolerated well N/A N/A Debridement Treatment Response: 0.2x0.2x0.2 N/A N/A Post Debridement Measurements L x W x D (cm) 0.006 N/A N/A Post Debridement Volume: (cm) Callus: Yes N/A N/A Periwound Skin Texture: Excoriation: No Induration: No Crepitus: No Rash: No Scarring: No Maceration: Yes N/A N/A Periwound Skin Moisture: Dry/Scaly: No Atrophie Blanche: No N/A N/A Periwound Skin Color: Cyanosis: No Ecchymosis: No Erythema: No Hemosiderin Staining: No Mottled: No Pallor: No Rubor: No No Abnormality N/A N/A Temperature: Cellular or Tissue Based Product N/A N/A Procedures Performed: Debridement T Contact Cast otal Treatment Notes Wound #3 (Foot) Wound Laterality: Plantar, Left Cleanser Soap and Water Discharge Instruction: May shower and wash wound with dial antibacterial soap and water prior to dressing change. Vashe 5.8 (oz) Discharge Instruction: Cleanse the wound with Vashe prior to applying a clean dressing using gauze sponges, not tissue or cotton balls. [name], [name] ([id number]) [id number] Page 4 of 8 Peri-Wound Care Zinc Oxide Ointment 30g tube Discharge Instruction: Apply Zinc Oxide to periwound with each dressing change Topical Gentamicin Discharge Instruction: As directed by physician Mupirocin Ointment Discharge Instruction: Apply Mupirocin (Bactroban) as instructed Primary Dressing ADAPTIC TOUCH 3x4.25 (in/in) Discharge Instruction: Apply to wound bed as instructed Secondary Dressing Woven Gauze Sponge, Non-Sterile 4x4 in Discharge Instruction: Apply over primary dressing as  directed. Zetuvit Plus 4x8 in Discharge Instruction: Apply over primary dressing as directed. Secured With [employer], 4.5x3.1 (in/yd) Discharge Instruction: Secure with Kerlix as directed. Compression Wrap Compression Stockings Add-Ons Electronic Signature(s) Signed: 11/10/2023 8:15:03 AM By: [name] MD FACS Entered By: [name] on 11/10/2023 05:15:03 -------------------------------------------------------------------------------- Multi-Disciplinary Care Plan Details Patient Name: Date of Service: [name] [place], [name] 11/10/2023 7:45 A M Medical Record Number: [id number] Patient Account Number: [id number] Date of Birth/Sex: Treating RN: September 28, 1971 (52 y.o. [name] Primary Care Mardy Lucier: [name] Other Clinician: Referring Belicia Difatta: Treating Kaloni Bisaillon/Extender: [name] in Treatment: 25 Active Inactive Wound/Skin Impairment Nursing Diagnoses: Impaired tissue integrity Knowledge deficit related to ulceration/compromised skin integrity Goals: Patient will have a decrease in wound volume by X% from date: (specify in notes) Date Initiated: 01/24/2023 Target Resolution Date: 12/30/2023 Goal Status: Active Patient/caregiver will verbalize understanding of skin care regimen Date Initiated: 01/24/2023 Target Resolution Date: 12/30/2023 Goal Status: Active Ulcer/skin breakdown will have a  volume reduction of 30% by week 4 Date Initiated: 01/24/2023 Date Inactivated: 03/07/2023 Target Resolution Date: 02/27/2023 Goal Status: Unmet Unmet Reason: larger today. Ulcer/skin breakdown will have a volume reduction of 50% by week 8 Date Initiated: 01/24/2023 Target Resolution Date: 12/30/2023 [name], [name] ([id number]) [id number] Page 5 of 8 Goal Status: Active Interventions: Assess patient/caregiver ability to obtain necessary supplies Assess patient/caregiver ability to perform  ulcer/skin care regimen upon admission and as needed Assess ulceration(s) every visit Notes: Electronic Signature(s) Signed: 11/10/2023 4:15:11 PM By: [name] Entered By: [name] on 11/10/2023 05:08:24 -------------------------------------------------------------------------------- Pain Assessment Details Patient Name: Date of Service: [name] [place], [name] 11/10/2023 7:45 A M Medical Record Number: [id number] Patient Account Number: [id number] Date of Birth/Sex: Treating RN: 05-13-1971 (52 y.o. [name] Primary Care Sahra Converse: [name] Other Clinician: Referring Kolsen Choe: Treating Heavenleigh Petruzzi/Extender: [name] in Treatment: 90 Active Problems Location of Pain Severity and Description of Pain Patient Has Paino No Site Locations Rate the pain. Current Pain Level: 0 Pain Management and Medication Current Pain Management: Electronic Signature(s) Signed: 11/10/2023 4:15:11 PM By: [name] Entered By: [name] on 11/10/2023 04:50:02 -------------------------------------------------------------------------------- Patient/Caregiver Education Details Patient Name: Date of Service: [name] [place], [name] 11/11/2024andnbsp7:45 A M Medical Record Number: [id number] Patient Account Number: [id number] Date of Birth/Gender: Treating RN: 1971-05-20 (52 y.o. [name] [place], [name] ([id number]) [id number] Page 6 of 8 Primary Care Physician: [name] Other Clinician: Referring Physician: Treating Physician/Extender: [name] in Treatment: 66 Education Assessment Education Provided To: Patient Education Topics Provided Wound/Skin Impairment: Methods: Explain/Verbal Responses: Reinforcements needed, State content correctly Electronic Signature(s) Signed: 11/10/2023 4:15:11 PM By: [name] Entered By: [name] on 11/10/2023 05:08:33 -------------------------------------------------------------------------------- Wound Assessment Details Patient Name: Date of Service: [name] [place], [name] 11/10/2023 7:45 A M Medical Record Number: [id number] Patient Account Number: [id number] Date of Birth/Sex: Treating RN: Mar 11, 1971 (52 y.o. [name] Primary Care Abriana Saltos: [name] Other Clinician: Referring Abrish Erny: Treating Christel Bai/Extender: [name] in Treatment: 41 Wound Status Wound Number: 3 Primary Diabetic Wound/Ulcer of the Lower Extremity Etiology: Wound Location: Left, Plantar Foot Wound Open Wounding Event: Gradually Appeared Status: Date Acquired: 10/01/2022 Comorbid Chronic sinus problems/congestion, Congestive Heart Failure, Weeks Of Treatment: 41 History: Coronary Artery Disease, Hypertension, Myocardial Infarction, Type Clustered Wound: No II Diabetes, Neuropathy Pending Amputation On Presentation Wound under treatment by [name] outside of Wound Center Photos Wound Measurements Length: (cm) 0.2 Width: (cm) 0.2 Depth: (cm) 0.2 Area: (cm) 0.031 Volume: (cm) 0.006 % Reduction in Area: 99.2% % Reduction in Volume: 99.2% Epithelialization: Large (67-100%) Tunneling: No Undermining: No Wound Description Classification: Grade 1 Wound Margin: Thickened Exudate Amount: Medium Exudate Type: Serosanguineous [place], [name] ([id number]) Exudate Color: red, brown Foul Odor After Cleansing: No Slough/Fibrino No [id number] Page 7 of 8 Wound Bed Granulation Amount: Large (67-100%) Exposed Structure Granulation Quality: Red Fascia Exposed: No Necrotic Amount: None Present (0%) Fat Layer (Subcutaneous Tissue) Exposed: Yes Tendon Exposed: No Muscle Exposed: No Joint Exposed: No Bone Exposed: No Periwound Skin Texture Texture Color No Abnormalities Noted: No No Abnormalities Noted: Yes Callus:  Yes Temperature / Pain Crepitus: No Temperature: No Abnormality Excoriation: No Induration: No Rash: No Scarring: No Moisture No Abnormalities Noted: Yes Treatment Notes Wound #3 (Foot) Wound Laterality: Plantar, Left Cleanser Soap and Water Discharge Instruction: May shower and wash wound with dial antibacterial soap and water prior to dressing change. Vashe 5.8 (oz) Discharge Instruction:  Cleanse the wound with Vashe prior to applying a clean dressing using gauze sponges, not tissue or cotton balls. Peri-Wound Care Zinc Oxide Ointment 30g tube Discharge Instruction: Apply Zinc Oxide to periwound with each dressing change Topical Gentamicin Discharge Instruction: As directed by physician Mupirocin Ointment Discharge Instruction: Apply Mupirocin (Bactroban) as instructed Primary Dressing ADAPTIC TOUCH 3x4.25 (in/in) Discharge Instruction: Apply to wound bed as instructed Secondary Dressing Woven Gauze Sponge, Non-Sterile 4x4 in Discharge Instruction: Apply over primary dressing as directed. Zetuvit Plus 4x8 in Discharge Instruction: Apply over primary dressing as directed. Secured With [employer], 4.5x3.1 (in/yd) Discharge Instruction: Secure with Kerlix as directed. Compression Wrap Compression Stockings Add-Ons Electronic Signature(s) Signed: 11/10/2023 4:15:11 PM By: [name] Entered By: [name] on 11/10/2023 05:01:05 [name] ([id number]) [id number] Page 8 of 8 -------------------------------------------------------------------------------- Vitals Details Patient Name: Date of Service: [name] [place], [name] RO LD 11/10/2023 7:45 A M Medical Record Number: [id number] Patient Account Number: [id number] Date of Birth/Sex: Treating RN: 01-07-1971 (52 y.o. [name] Primary Care Dietra Stokely: [name] Other Clinician: Referring Johannes Everage: Treating Veronica Fretz/Extender: [name] in Treatment: 41 Vital Signs Time Taken: 07:49 Temperature (F): 97.8 Height (in): 78 Pulse (bpm): 88 Weight (lbs): 275 Respiratory Rate (breaths/min): 18 Body Mass Index (BMI): 31.8 Blood Pressure (mmHg): 112/80 Reference Range: 80 - 120 mg / dl Electronic Signature(s) Signed: 11/10/2023 4:15:11 PM By: [name] Entered By: [name] on 11/10/2023 04:49:56

## 2023-11-17 ENCOUNTER — Ambulatory Visit: Payer: 59 | Admitting: Internal Medicine

## 2023-11-18 ENCOUNTER — Encounter (HOSPITAL_BASED_OUTPATIENT_CLINIC_OR_DEPARTMENT_OTHER): Payer: 59 | Admitting: General Surgery

## 2023-11-18 DIAGNOSIS — E11621 Type 2 diabetes mellitus with foot ulcer: Secondary | ICD-10-CM | POA: Diagnosis not present

## 2023-11-18 DIAGNOSIS — I130 Hypertensive heart and chronic kidney disease with heart failure and stage 1 through stage 4 chronic kidney disease, or unspecified chronic kidney disease: Secondary | ICD-10-CM | POA: Diagnosis not present

## 2023-11-18 DIAGNOSIS — L97522 Non-pressure chronic ulcer of other part of left foot with fat layer exposed: Secondary | ICD-10-CM | POA: Diagnosis not present

## 2023-11-18 NOTE — Progress Notes (Signed)
ADOLPHO, URSIN ([id number]) [id number] Page 1 of 11 Visit Report for 11/18/2023 Chief Complaint Document Details Patient Name: Date of Service: [name] [place], [name] RO LD 11/18/2023 7:45 A M Medical Record Number: [id number] Patient Account Number: [id number] Date of Birth/Sex: Treating RN: Feb 23, 1971 (52 y.o. M) Primary Care Provider: [name] Other Clinician: Referring Provider: Treating Provider/Extender: [name] in Treatment: 78 Information Obtained from: Patient Chief Complaint 01/15/2023; left foot wound Electronic Signature(s) Signed: 11/18/2023 8:22:24 AM By: [name] MD FACS Entered By: [name] on 11/18/2023 05:22:24 -------------------------------------------------------------------------------- HPI Details Patient Name: Date of Service: [name] [place], [name] 11/18/2023 7:45 A M Medical Record Number: [id number] Patient Account Number: [id number] Date of Birth/Sex: Treating RN: 10-Mar-1971 (52 y.o. M) Primary Care Provider: [name] Other Clinician: Referring Provider: Treating Provider/Extender: [name] in Treatment: 28 History of Present Illness HPI Description: ADMISSION 01/11/2020 This is a 52 year old man with uncontrolled type 2 diabetes with a recent hemoglobin A1c earlier this year of 13.4. He is on insulin and glipizide. He does not take his blood sugars at home. He does have a follow-up with primary care later this month I believe on January 27. He tells [place] that roughly a month ago he was walking with a shoe with a hole in his foot. He took the shoe off and there was an open wound at roughly the left fourth met head. This has significant undermining and raised edges. He has not noticed any purulence he does not feel unwell. More recently he was taking skin off the bottom of his foot and has a superficial area on the left fifth met head. He has not  been offloading this. The patient was in the ER on 12/20. They gave him Bactroban which she has been using on the wound and 10 days worth of doxycycline. No x-rays were done. He has not had vascular studies. He is also been using hydrogen peroxide. Past medical history type 2 diabetes uncontrolled, chronic systolic heart failure, coronary artery disease with a history of congestive heart failure with stents. Hypertension hyperlipidemia and chronic renal insufficiency ABI in this clinic was 1.14 on the left. 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 He is uncertain whether he would be able to work if we put him on some form of restriction 1/19; he is generally doing quite well. Using silver alginate on the wounds. Things actually look better. He has a forefoot offloading boot which she seems to be compliant about. He has support at work to stay off his foot is much as possible which is gratifying. Culture I did last week showed a few Enterococcus faecalis. I am going to put him on Augmentin. I talked about ordering an x-ray in my note last week but that does not seem to have happened. We will review reorder the x-ray this week. 1/26; x-ray reordered last week was negative for osteomyelitis. We are using silver alginate on the wound on the third and fifth met heads on the left. He is using a Darco forefoot off loader 2/2; the area on the fifth met head is closed. Third met head is still open with tunneling depth and thick callus. 2/9; the area on the fifth met head remains closed however the third met head again has a small open area on presentation with tunneling in depth and surrounded by thick callus. This looks like a pressure issue. We have been using silver  alginate 2/16; the area of the fifth metatarsal head remains closed however the area over the third metatarsal head again is a small open area but with some depth. I do not think this is changed much since last  week. He is using Hydrofera Blue with forefoot off loader. He is not able to use a total contact cast on the left leg KERSHAW, SPHAR ([id number]) [id number] Page 2 of 11 because he needs his left leg at work [employer]]. Fortunately the wound does not look infected. I changed him to endoform today 2/26; the area of the fifth metatarsal head remains closed. The area of the third metatarsal head has an even smaller opening this time. I used endoform on this this looks improved. He is offloading this is much as he can and a forefoot off loader on the left. He cannot have a total contact cast because of work responsibilities 3/5; the area on the fifth metatarsal head remains closed the area on the third metatarsal head is also closed on the left foot. 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 He has been following for podiatry for several months for this issue. He is currently using wet-to-dry saline dressings. He currently denies signs of infection. Progresse note from 1/17; Patient presents with a 57-month history of nonhealing ulcer to the left foot secondary to diabetes and inability to offload well the area. He has had multiple debridements in the past to his feet bilaterally. He has had resection of the fourth left met head in the fifth toe Secondary to osteomyelitis. We discussed the importance of glycemic control for wound healing. Due to his blood glucose levels being elevated he is at high risk for infection and thus further amputation. He expressed understanding. He states he is supposed to be referred  to an endocrinologist at [facility] however the referral fell through. Unclear what happened. Offered a referral to endocrinology at [facility]. Patient was agreeable.Furthermore we discussed the importance of aggressive offloading for his wound healing. This will be the most difficult part of the treatment plan for the patient to do. We discussed a total contact cast however he has declined that at this time. 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 He is currently using a surgical shoe with a peg assist. It does not fit well so we will give him a new one today. I do not think this is enough offloading. If he is not able to offload this area he will likely end up with a BKA. He is well aware of this. For now I recommended Medihoney and Hydrofera Blue for dressing changes. He will follow-up in our [place] office Since this is closer for him. 1/26; patient presents for follow-up. Last clinic visit I had seen him in [place] and we transferred him to [place] since this is a closer location for him. Progress note above from that visit. Over the past week He has been using Medihoney and Hydrofera Blue to the wound bed. He has been using his surgical shoe with peg assist. He has no issues or complaints today.  We discussed doing the total contact cast and he was agreeable to having this placed at next clinic visit. 2/2; patient presents for follow-up. He has been using Vashe wet-to-dry dressings. Plan is for the total contact cast today. 2/5; patient presents for follow-up. The cast was placed 3 days ago. He tolerated this well however had a lot of drainage. He currently denies signs of infection. 2/9; patient presents for follow-up. At last clinic visit we held off on replacing the cast due to drainage. He has been using Vashe wet-to-dry dressings and he is currently taking the oral antibiotics prescribed without issues. He declines a  total contact cast today. We ran insurance verification for skin substitute and due to cost patient declines having this placed. 2/16; patient presents for follow-up. He has decided not to follow-up with podiatry. He continues to decline the total contact cast. He has been using Vashe wet- to-dry dressings. He has not been wearing his surgical shoe with peg assist. He currently denies signs of infection. 3/1; patient presents for follow-up. He has been doing Vashe wet-to-dry dressings. He has a surgical shoe with peg assist. We discussed potentially doing a skin substitute for which she has been approved for by insurance. He knows the out-of-pocket cost of this and would still like to proceed with having this placed today. He denies the current total contact cast. 3/8; patient presents for follow-up. He states that the skin substitute came off after a few days and he has been using Vashe wet-to-dry dressings. He has mild odor to the wound bed on exam. 3/15; patient presents for follow-up. He has been using Vashe wet-to-dry dressings and started his course of Augmentin. He reports improvement in wound healing. He has no issues or complaints today. 3/21; patient presents for follow-up. He continues to use Vashe wet-to-dry dressings. He has no issues or complaints today. 3/28; patient presents for follow-up. He has been using Vashe wet-to-dry dressings. He has no issues or complaints today. He has been using his peg assist with surgical shoe. 4/11; patient presents for follow-up. He has been using Vashe wet-to-dry dressings. The periwound is macerated. He has been using his peg assist with surgical shoe. He has no issues or complaints today. 4/25; patient presents for follow-up. Has been using Vashe wet-to-dry dressings. He has been using zinc oxide to the periwound. For offloading he has been using his peg assist in surgical shoe. He has no issues or complaints today. 5/9; patient presents for  follow-up. He has been using Vashe wet-to-dry dressings. He has no issues or complaints today. He has a surgical shoe with peg assist for offloading. 5/23; patient presents for follow up. He has been using Vashe to clean the wound bed and collagen and blast X with dressing changes. [place] went to the beach [place] 2 [place]. Wound is bigger. 5/31; patient presents for follow-up. He has been using Vashe wet-to-dry dressings. Wound is stable but with healthier granulation tissue. Patient would like to hold off on the total contact cast and try a knee scooter for offloading. 6/13; patient presents for follow-up. He has been using Vashe wet-to-dry dressings. He has been using a knee scooter for offloading however states he felt unstable on this and decided to buy an electric scooter. This arrived in the mail today. He would like to hold off on the left total contact cast. Wound is slightly smaller today. 6/27; patient presents for follow-up. He has been using Vashe wet-to-dry dressings. He is using an Art gallery manager  for mobility to help relieve the pressure off the wound bed. Wound is smaller today. He wants to hold off on doing the total contact cast as he is showing signs of improvement with wound healing. 7/11; patient presents for follow-up. He has been using Vashe wet-to-dry dressings and his electric scooter for offloading. Wound is slightly smaller. 7/25; patient presents for follow up. He has been using Vashe wet-to-dry dressings and he reports using his electric scooter for offloading. Wound is larger. 7/30; patient presents for follow-up. He has been using Vashe wet-to-dry dressings. He has taken Augmentin as prescribed without issues. Overall wound has healthier granulation tissue. 8/1; patient presents for follow-up. He presents for obligatory cast change. He tolerated the cast well and has no issues or complaints today. We have been using antibiotic ointment with Hydrofera Blue under the cast. Wound  is slightly smaller. 8/8; patient presents for follow-up. We have been using antibiotic ointment with Hydrofera Blue under the total contact cast. Wound is smaller. He has no issues or complaints today. 8/13; patient presents for follow-up. We have been using antibiotic ointment with Hydrofera Blue under the total contact cast. He comes in today because the top part of the cast over the foot has sunken as well as on the side. He wanted to assure that there were no issues. [name], [name] ([id number]) [id number] Page 3 of 11 8/22; patient presents for follow-up. He has been using Hydrofera Blue to the wound bed. Previously we have been using a total contact cast. We do not have total contact casts due to manufacturing issues available for patients. He has no issues or complaints. He denies signs of infection. 8/27; patient presents for follow-up. He has been using Vashe wet-to-dry dressings. T contact cast is available for placement today. He was agreeable to otal proceed with this. He denies signs of infection. 9/3; patient presents for follow-up. We have been using Hydrofera Blue with antibiotic ointment under the total contact cast for the past week. He has tolerated this well. The wound is smaller. Unfortunately we do not have a total contact cast in office today for replacement as there is been manufacturing issues. 9/5; patient was seen earlier this week and plan was for a total contact cast but there is none available. The cast finally arrived in patient has come in today to have this placed. We will continue with Hydrofera Blue and antibiotic ointment under the cast. 9/12; patient presents for follow-up. We have been using Hydrofera Blue and antibiotic ointment under the cast. Wound is stable. He denies signs of infection. 09/17/2023: The wound is measuring smaller today. The surface appears healthy and there is minimal accumulation of slough. 09/23/2023: The wound  external measurements remain about the same, but it is shallower and visually appears smaller. 09/30/2023: The wound is covered with a layer of callus. Underneath the callus, there is a tiny superficial residual opening. 10/14/2023: He returns after being away for 2 weeks, during which he was out of a cast. Not surprisingly, his wound has reopened substantially. It remains clean, however. 10/21/2023: We have recovered most of what was lost as far as progress with his absence from clinic. The wound did measure smaller today. It is a little bit macerated around the edges and he reports that he thinks some water got in over the top of his cast while showering. 10/28/2023: The wound measures smaller today. There is no tissue maceration. There is some slough on the surface and a little bit of callus  accumulation around the edges. 11/04/2023: The wound measured a little bit deeper today and had some undermining, but this seems to be due to mainly to accumulated callus. The surface has a bit of slough. He has been approved for Grafix so we will plan to apply that today. 11/10/2023: He has responded beautifully to Grafix application. His wound is down to just a couple of millimeters. There is some slough on the surface. 11/18/2023: His wound is freshly healed. Electronic Signature(s) Signed: 11/18/2023 8:22:45 AM By: [name] MD FACS Entered By: [name] on 11/18/2023 05:22:45 -------------------------------------------------------------------------------- Physical Exam Details Patient Name: Date of Service: [name], [name] 11/18/2023 7:45 A M Medical Record Number: [id number] Patient Account Number: [id number] Date of Birth/Sex: Treating RN: 03-31-1971 (52 y.o. M) Primary Care Provider: [name] Other Clinician: Referring Provider: Treating Provider/Extender: [name] in Treatment: 42 Constitutional . . . . no acute  distress. Respiratory Normal work of breathing on room air.. Notes 11/18/2023: His wound is freshly healed. Electronic Signature(s) Signed: 11/18/2023 8:23:15 AM By: [name] MD FACS Entered By: [name] on 11/18/2023 05:23:15 [name] ([id number]) [id number] Page 4 of 11 -------------------------------------------------------------------------------- Physician Orders Details Patient Name: Date of Service: [name] [place], [name] RO LD 11/18/2023 7:45 A M Medical Record Number: [id number] Patient Account Number: [id number] Date of Birth/Sex: Treating RN: Oct 30, 1971 (52 y.o. [name] Primary Care Provider: [name] Other Clinician: Referring Provider: Treating Provider/Extender: [name] in Treatment: 42 The following information was scribed by: [name] The information was scribed for: [name] Verbal / Phone Orders: No Diagnosis Coding ICD-10 Coding Code Description [phone] 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.621 Type 2 diabetes mellitus with foot ulcer Z89.422 Acquired absence of other left toe(s) Follow-up Appointments ppointment in 1 week. - Dr. Lady Gary - room 2 Return A Bathing/ Shower/ Hygiene May shower with protection but do not get wound dressing(s) wet. Protect dressing(s) with water repellant cover (for example, large plastic bag) or a cast cover and may then take shower. Off-Loading Total Contact Cast to Left Lower Extremity - TCC size 3 heel cup to toes. foam pad to anterior ankle Removable cast walker boot to: - left foot Wound Treatment Wound #3 - Foot Wound Laterality: Plantar, Left Cleanser: Soap and Water 1 x Per Day/30 Days Discharge Instructions: May shower and wash wound with dial antibacterial soap and water prior to dressing change. Secondary Dressing: Woven Gauze Sponge, Non-Sterile 4x4 in 1 x Per  Day/30 Days Discharge Instructions: Apply over primary dressing as directed. Secured With: 92M Medipore H Soft Cloth Surgical T ape, 4 x 10 (in/yd) 1 x Per Day/30 Days Discharge Instructions: Secure with tape as directed. Electronic Signature(s) Signed: 11/18/2023 10:29:35 AM By: [name] MD FACS Entered By: [name] on 11/18/2023 05:23:30 -------------------------------------------------------------------------------- Problem List Details Patient Name: Date of Service: [name] [place], [name] 11/18/2023 7:45 A M Medical Record Number: [id number] Patient Account Number: [id number] Date of Birth/Sex: Treating RN: 1971-11-21 (52 y.o. M) Primary Care Provider: [name] Other Clinician: Referring Provider: Treating Provider/Extender: [name] in Treatment: 72 Active Problems ICD-10 Encounter Code Description Active Date MDM Diagnosis [phone] Non-pressure chronic ulcer of other part of left foot with fat layer exposed 01/24/2023 No Yes LONAN, WESTENSKOW ([id number]) [id number] Page 5 of 11 E11.621 Type 2 diabetes mellitus with foot ulcer 01/24/2023 No Yes Z89.422 Acquired absence of other  left toe(s) 01/24/2023 No Yes Inactive Problems Resolved Problems Electronic Signature(s) Signed: 11/18/2023 8:22:11 AM By: [name] MD FACS Entered By: [name] on 11/18/2023 05:22:11 -------------------------------------------------------------------------------- Progress Note Details Patient Name: Date of Service: [name] [place], [name] 11/18/2023 7:45 A M Medical Record Number: [id number] Patient Account Number: [id number] Date of Birth/Sex: Treating RN: May 15, 1971 (52 y.o. M) Primary Care Provider: [name] Other Clinician: Referring Provider: Treating Provider/Extender: [name] in Treatment: 35 Subjective Chief Complaint Information obtained from  Patient 01/15/2023; left foot wound History of Present Illness (HPI) ADMISSION 01/11/2020 This is a 52 year old man with uncontrolled type 2 diabetes with a recent hemoglobin A1c earlier this year of 13.4. He is on insulin and glipizide. He does not take his blood sugars at home. He does have a follow-up with primary care later this month I believe on January 27. He tells [place] that roughly a month ago he was walking with a shoe with a hole in his foot. He took the shoe off and there was an open wound at roughly the left fourth met head. This has significant undermining and raised edges. He has not noticed any purulence he does not feel unwell. More recently he was taking skin off the bottom of his foot and has a superficial area on the left fifth met head. He has not been offloading this. The patient was in the ER on 12/20. They gave him Bactroban which she has been using on the wound and 10 days worth of doxycycline. No x-rays were done. He has not had vascular studies. He is also been using hydrogen peroxide. Past medical history type 2 diabetes uncontrolled, chronic systolic heart failure, coronary artery disease with a history of congestive heart failure with stents. Hypertension hyperlipidemia and chronic renal insufficiency ABI in this clinic was 1.14 on the left. 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 He is uncertain whether he would be able to work if we put him on some form of restriction 1/19; he is generally doing quite well. Using silver alginate on the wounds. Things actually look better. He has a forefoot offloading boot which she seems to be compliant about. He has support at work to stay off his foot is much as possible which is gratifying. Culture I did last week showed a few Enterococcus faecalis. I am going to put him on Augmentin. I talked about ordering an x-ray in my note last week but that does not seem to have happened. We will review reorder the  x-ray this week. 1/26; x-ray reordered last week was negative for osteomyelitis. We are using silver alginate on the wound on the third and fifth met heads on the left. He is using a Darco forefoot off loader 2/2; the area on the fifth met head is closed. Third met head is still open with tunneling depth and thick callus. 2/9; the area on the fifth met head remains closed however the third met head again has a small open area on presentation with tunneling in depth and surrounded by thick callus. This looks like a pressure issue. We have been using silver alginate 2/16; the area of the fifth metatarsal head remains closed however the area over the third metatarsal head again is a small open area but with some depth. I do not think this is changed much since last week. He is using Hydrofera Blue with forefoot off loader. He is not able to use a  total contact cast on the left leg because he needs his left leg at work [employer]]. Fortunately the wound does not look infected. I changed him to endoform today 2/26; the area of the fifth metatarsal head remains closed. The area of the third metatarsal head has an even smaller opening this time. I used endoform on this this looks improved. He is offloading this is much as he can and a forefoot off loader on the left. He cannot have a total contact cast because of work responsibilities 3/5; the area on the fifth metatarsal head remains closed the area on the third metatarsal head is also closed on the left foot. 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 He has been following for podiatry for several months for this issue. He is currently using wet-to-dry saline dressings. He currently denies signs of infection. Progresse note from 1/17; Patient presents with a 86-month history of nonhealing ulcer to the left foot secondary to diabetes and inability to offload well the area. He has had multiple debridements in the past to his feet bilaterally. He has had resection of the fourth left met head in the fifth toe Secondary to osteomyelitis. We discussed the importance of glycemic control for wound healing. Due to his blood glucose levels being elevated he is at high risk for infection and thus further amputation. He expressed understanding. He states he is supposed to be referred to an endocrinologist at [facility] however the referral fell through. Unclear what happened. Offered a referral to endocrinology at [facility]. Patient was agreeable.Furthermore we discussed the importance of aggressive offloading for his wound healing. This will be the most difficult part of the treatment plan for the patient to do. We discussed a total contact cast however he has declined that at this time. 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 He is currently using a surgical shoe with a peg assist. It does not fit well so we will give him a new one today. I do not think this is enough offloading. If he is not able to offload this area he will likely end up with a BKA. He is well aware of this. For now I recommended Medihoney and Hydrofera Blue for dressing changes. He will follow-up in our [place] office Since this is closer for him. 1/26; patient presents for follow-up. Last clinic visit I had seen him in [name] and we transferred him to [place] since this is a closer location for him. Progress note above from that visit. Over the past week He has been using  Medihoney and Hydrofera Blue to the wound bed. He has been using his surgical shoe with peg assist. He has no issues or complaints today. We discussed doing the total contact cast and he was agreeable to having this placed at next clinic visit. 2/2; patient presents for follow-up. He has been using Vashe wet-to-dry dressings. Plan is for the total contact cast today. 2/5; patient presents for follow-up. The cast was placed 3 days ago. He tolerated this well however  had a lot of drainage. He currently denies signs of infection. 2/9; patient presents for follow-up. At last clinic visit we held off on replacing the cast due to drainage. He has been using Vashe wet-to-dry dressings and he is currently taking the oral antibiotics prescribed without issues. He declines a total contact cast today. We ran insurance verification for skin substitute and due to cost patient declines having this placed. 2/16; patient presents for follow-up. He has decided not to follow-up with podiatry. He continues to decline the total contact cast. He has been using Vashe wet- to-dry dressings. He has not been wearing his surgical shoe with peg assist. He currently denies signs of infection. 3/1; patient presents for follow-up. He has been doing Vashe wet-to-dry dressings. He has a surgical shoe with peg assist. We discussed potentially doing a skin substitute for which she has been approved for by insurance. He knows the out-of-pocket cost of this and would still like to proceed with having this placed today. He denies the current total contact cast. 3/8; patient presents for follow-up. He states that the skin substitute came off after a few days and he has been using Vashe wet-to-dry dressings. He has mild odor to the wound bed on exam. 3/15; patient presents for follow-up. He has been using Vashe wet-to-dry dressings and started his course of Augmentin. He reports improvement in wound healing. He has no issues or  complaints today. 3/21; patient presents for follow-up. He continues to use Vashe wet-to-dry dressings. He has no issues or complaints today. 3/28; patient presents for follow-up. He has been using Vashe wet-to-dry dressings. He has no issues or complaints today. He has been using his peg assist with surgical shoe. 4/11; patient presents for follow-up. He has been using Vashe wet-to-dry dressings. The periwound is macerated. He has been using his peg assist with surgical shoe. He has no issues or complaints today. 4/25; patient presents for follow-up. Has been using Vashe wet-to-dry dressings. He has been using zinc oxide to the periwound. For offloading he has been using his peg assist in surgical shoe. He has no issues or complaints today. 5/9; patient presents for follow-up. He has been using Vashe wet-to-dry dressings. He has no issues or complaints today. He has a surgical shoe with peg assist for offloading. 5/23; patient presents for follow up. He has been using Vashe to clean the wound bed and collagen and blast X with dressing changes. [place] the beach [place] 2 [place]. Wound is bigger. 5/31; patient presents for follow-up. He has been using Vashe wet-to-dry dressings. Wound is stable but with healthier granulation tissue. Patient would like to hold off on the total contact cast and try a knee scooter for offloading. 6/13; patient presents for follow-up. He has been using Vashe wet-to-dry dressings. He has been using a knee scooter for offloading however states he felt unstable on this and decided to buy an electric scooter. This arrived in the mail today. He would like to hold off on the left total contact cast. Wound is slightly smaller today. 6/27; patient presents for follow-up. He has been using Vashe wet-to-dry dressings. He is using an Art gallery manager for mobility to help relieve the pressure off the wound bed. Wound is smaller today. He wants to hold off on doing the total contact  cast as he is showing signs of improvement with wound healing. 7/11; patient presents for follow-up. He has been using Vashe wet-to-dry dressings and his electric scooter for offloading. Wound is  slightly smaller. 7/25; patient presents for follow up. He has been using Vashe wet-to-dry dressings and he reports using his electric scooter for offloading. Wound is larger. 7/30; patient presents for follow-up. He has been using Vashe wet-to-dry dressings. He has taken Augmentin as prescribed without issues. Overall wound has healthier granulation tissue. 8/1; patient presents for follow-up. He presents for obligatory cast change. He tolerated the cast well and has no issues or complaints today. We have been using antibiotic ointment with Hydrofera Blue under the cast. Wound is slightly smaller. 8/8; patient presents for follow-up. We have been using antibiotic ointment with Hydrofera Blue under the total contact cast. Wound is smaller. He has no issues or complaints today. 8/13; patient presents for follow-up. We have been using antibiotic ointment with Hydrofera Blue under the total contact cast. He comes in today because the top part of the cast over the foot has sunken as well as on the side. He wanted to assure that there were no issues. 8/22; patient presents for follow-up. He has been using Hydrofera Blue to the wound bed. Previously we have been using a total contact cast. We do not have total contact casts due to manufacturing issues available for patients. He has no issues or complaints. He denies signs of infection. 8/27; patient presents for follow-up. He has been using Vashe wet-to-dry dressings. T contact cast is available for placement today. He was agreeable to otal proceed with this. He denies signs of infection. [name], [name] ([id number]) [id number] Page 7 of 11 9/3; patient presents for follow-up. We have been using Hydrofera Blue with antibiotic ointment  under the total contact cast for the past week. He has tolerated this well. The wound is smaller. Unfortunately we do not have a total contact cast in office today for replacement as there is been manufacturing issues. 9/5; patient was seen earlier this week and plan was for a total contact cast but there is none available. The cast finally arrived in patient has come in today to have this placed. We will continue with Hydrofera Blue and antibiotic ointment under the cast. 9/12; patient presents for follow-up. We have been using Hydrofera Blue and antibiotic ointment under the cast. Wound is stable. He denies signs of infection. 09/17/2023: The wound is measuring smaller today. The surface appears healthy and there is minimal accumulation of slough. 09/23/2023: The wound external measurements remain about the same, but it is shallower and visually appears smaller. 09/30/2023: The wound is covered with a layer of callus. Underneath the callus, there is a tiny superficial residual opening. 10/14/2023: He returns after being away for 2 weeks, during which he was out of a cast. Not surprisingly, his wound has reopened substantially. It remains clean, however. 10/21/2023: We have recovered most of what was lost as far as progress with his absence from clinic. The wound did measure smaller today. It is a little bit macerated around the edges and he reports that he thinks some water got in over the top of his cast while showering. 10/28/2023: The wound measures smaller today. There is no tissue maceration. There is some slough on the surface and a little bit of callus accumulation around the edges. 11/04/2023: The wound measured a little bit deeper today and had some undermining, but this seems to be due to mainly to accumulated callus. The surface has a bit of slough. He has been approved for Grafix so we will plan to apply that today. 11/10/2023: He has responded beautifully to Grafix application.  His wound  is down to just a couple of millimeters. There is some slough on the surface. 11/18/2023: His wound is freshly healed. Patient History Information obtained from Patient. Family History Heart Disease - Mother,Father, No family history of Cancer, Diabetes, Hereditary Spherocytosis, Hypertension, Kidney Disease, Lung Disease, Seizures, Stroke, Thyroid Problems, Tuberculosis. Social History Never smoker, Marital Status - Single, Alcohol Use - Rarely, Drug Use - No History, Caffeine Use - Rarely. 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 61 Denies history of Angina, Arrhythmia, Deep Vein Thrombosis, Hypotension, Peripheral Arterial Disease, Peripheral Venous Disease, Phlebitis, Vasculitis Gastrointestinal Denies history of Cirrhosis , Colitis, Crohns, Hepatitis A, Hepatitis B, Hepatitis C Endocrine Patient has history of Type II Diabetes Genitourinary Denies history of End Stage Renal Disease Immunological Denies history of Lupus Erythematosus, Raynauds, Scleroderma Integumentary (Skin) Denies history of History of Burn Musculoskeletal Denies history of Gout, Rheumatoid Arthritis, Osteoarthritis, Osteomyelitis Neurologic Patient has history of Neuropathy Denies history of Dementia, Quadriplegia, Paraplegia, Seizure Disorder Oncologic Denies history of Received Chemotherapy, Received Radiation Psychiatric Denies history of Anorexia/bulimia, Confinement  Anxiety Hospitalization/Surgery History - Heart Cath in 2018. Medical A Surgical History Notes nd Cardiovascular Ischemic Cardiomyopathy Genitourinary Renal Insufficiency [name], [name] ([id number]) [id number] Page 8 of 11 Objective Constitutional no acute distress. Vitals Time Taken: 7:42 AM, Height: 78 in, Weight: 275 lbs, BMI: 31.8, Temperature: 97.9 F, Pulse: 85 bpm, Respiratory Rate: 16 breaths/min, Blood Pressure: 121/83 mmHg. Respiratory Normal work of breathing on room air.. General Notes: 11/18/2023: His wound is freshly healed. Integumentary (Hair, Skin) Wound #3 status is Open. Original cause of wound was Gradually Appeared. The date acquired was: 10/01/2022. The wound has been in treatment 42 weeks. The wound is located on the Left,Plantar Foot. The wound measures 0cm length x 0cm width x 0cm depth; 0cm^2 area and 0cm^3 volume. There is no tunneling or undermining noted. There is a none present amount of drainage noted. The wound margin is thickened. There is no granulation within the wound bed. There is no necrotic tissue within the wound bed. The periwound skin appearance had no abnormalities noted for moisture. The periwound skin appearance had no abnormalities noted for color. The periwound skin appearance exhibited: Callus. The periwound skin appearance did not exhibit: Crepitus, Excoriation, Induration, Rash, Scarring. Periwound temperature was noted as No Abnormality. 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 There was a T Contact Cast otal Procedure by [name], MD. Post procedure Diagnosis Wound #3: Same as Pre-Procedure Plan Follow-up  Appointments: Return Appointment in 1 week. - Dr. Lady Gary - room 2 Bathing/ Shower/ Hygiene: May shower with protection but do not get wound dressing(s) wet. Protect dressing(s) with water repellant cover (for example, large plastic bag) or a cast cover and may then take shower. Off-Loading: T Contact Cast to Left Lower Extremity - TCC size 3 heel cup to toes. foam pad to anterior ankle otal Removable  cast walker boot to: - left foot WOUND #3: - Foot Wound Laterality: Plantar, Left Cleanser: Soap and Water 1 x Per Day/30 Days Discharge Instructions: May shower and wash wound with dial antibacterial soap and water prior to dressing change. Secondary Dressing: Woven Gauze Sponge, Non-Sterile 4x4 in 1 x Per Day/30 Days Discharge Instructions: Apply over primary dressing as directed. Secured With: 82M Medipore H Soft Cloth Surgical T ape, 4 x 10 (in/yd) 1 x Per Day/30 Days Discharge Instructions: Secure with tape as directed. 11/18/2023: His wound is freshly healed. I am a bit nervous about taking him out of the cast completely, based on the deterioration that occurred the last time we did so. Of course the wound was not closed at that point. I would like to cast him for 1 more week to ensure greater tissue strength and stability of the healed site. He is agreed to go along with this. A total contact cast was applied. He does have an appointment with podiatry and orthotics to have custom inserts made, so hopefully that will prevent future tissue breakdown episodes. He will follow-up in 1 week's time. Electronic Signature(s) Signed: 11/18/2023 8:26:32 AM By: [name] MD FACS Entered By: [name] on 11/18/2023 16:10:96 [name] ([id number]) [id number] Page 9 of 11 -------------------------------------------------------------------------------- HxROS Details Patient Name: Date of Service: [name] [place], [name] RO LD 11/18/2023 7:45 A M Medical Record  Number: [id number] Patient Account Number: [id number] Date of Birth/Sex: Treating RN: 1971/04/20 (52 y.o. M) Primary Care Provider: [name] Other Clinician: Referring Provider: Treating Provider/Extender: [name] in Treatment: 42 Information Obtained From Patient Eyes Medical History: Negative for: Cataracts; Glaucoma; Optic Neuritis Ear/Nose/Mouth/Throat Medical History: Positive for: Chronic sinus problems/congestion Negative for: Middle ear problems Hematologic/Lymphatic Medical History: Negative for: Anemia; Hemophilia; Human Immunodeficiency Virus; Lymphedema; Sickle Cell Disease Respiratory Medical History: Negative for: Aspiration; Asthma; Chronic Obstructive Pulmonary Disease (COPD); Pneumothorax; Sleep Apnea; Tuberculosis Cardiovascular Medical History: Positive for: Congestive Heart Failure; Coronary Artery Disease; Hypertension; Myocardial Infarction - age 65 Negative for: Angina; Arrhythmia; Deep Vein Thrombosis; Hypotension; Peripheral Arterial Disease; Peripheral Venous Disease; Phlebitis; Vasculitis Past Medical History Notes: Ischemic Cardiomyopathy Gastrointestinal Medical History: Negative for: Cirrhosis ; Colitis; Crohns; Hepatitis A; Hepatitis B; Hepatitis C Endocrine Medical History: Positive for: Type II Diabetes Time with diabetes: ten years Treated with: Insulin Blood sugar tested every day: No Genitourinary Medical History: Negative for: End Stage Renal Disease Past Medical History Notes: Renal Insufficiency Immunological Medical History: Negative for: Lupus Erythematosus; Raynauds; Scleroderma Integumentary (Skin) Medical History: Negative for: History of Burn [name], [name] ([id number]) [id number] Page 10 of 11 Musculoskeletal Medical History: Negative for: Gout; Rheumatoid Arthritis; Osteoarthritis; Osteomyelitis Neurologic Medical History: Positive for:  Neuropathy Negative for: Dementia; Quadriplegia; Paraplegia; Seizure Disorder Oncologic Medical History: Negative for: Received Chemotherapy; Received Radiation Psychiatric Medical History: Negative for: Anorexia/bulimia; Confinement Anxiety HBO Extended History Items Ear/Nose/Mouth/Throat: Chronic sinus problems/congestion Immunizations Pneumococcal Vaccine: Received Pneumococcal Vaccination: No Implantable Devices None Hospitalization / Surgery History Type of Hospitalization/Surgery Heart Cath in 2018 Family and Social History Cancer: No; Diabetes: No; Heart Disease: Yes - Mother,Father; Hereditary Spherocytosis: No; Hypertension: No; Kidney Disease: No; Lung Disease: No; Seizures: No; Stroke: No; Thyroid Problems: No; Tuberculosis: No; Never smoker; Marital Status - Single; Alcohol Use: Rarely; Drug Use: No History; Caffeine Use: Rarely; Financial Concerns: No; Food, Clothing or Shelter Needs: No; Support System Lacking: No; Transportation Concerns: No Electronic Signature(s) Signed: 11/18/2023 10:29:35 AM By: [name] MD FACS Entered By: [name] on 11/18/2023  05:22:50 -------------------------------------------------------------------------------- Total Contact Cast Details Patient Name: Date of Service: [name] [place], [name] RO LD 11/18/2023 7:45 A M Medical Record Number: [id number] Patient Account Number: [id number] Date of Birth/Sex: Treating RN: Jul 06, 1971 (52 y.o. [name] Primary Care Provider: [name] Other Clinician: Referring Provider: Treating Provider/Extender: [name] in Treatment: 42 T Contact Cast Applied for Wound Assessment: otal Wound #3 Left,Plantar Foot Performed By: Physician [name], MD The following information was scribed by: [name] The information was scribed for: [name] Post Procedure Diagnosis Same as Pre-procedure Electronic Signature(s) Signed:  11/18/2023 10:29:35 AM By: [name] MD FACS Signed: 11/18/2023 3:29:21 PM By: Alwyn Ren ([id number]) [id number] Page 11 of 11 Entered By: [name] on 11/18/2023 05:00:05 -------------------------------------------------------------------------------- SuperBill Details Patient Name: Date of Service: [name] [place], [name] RO LD 11/18/2023 Medical Record Number: [id number] Patient Account Number: [id number] Date of Birth/Sex: Treating RN: Nov 27, 1971 (52 y.o. M) Primary Care Provider: [name] Other Clinician: Referring Provider: Treating Provider/Extender: [name] in Treatment: 42 Diagnosis Coding ICD-10 Codes Code Description [phone] 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.621 Type 2 diabetes mellitus with foot ulcer Z89.422 Acquired absence of other left toe(s) Facility Procedures : CPT4 Code: [id number] Description: [phone] - APPLY TOTAL CONTACT LEG CAST ICD-10 Diagnosis Description L97.522 Non-pressure chronic ulcer of other part of left foot with fat layer exposed Modifier: Quantity: 1 Physician Procedures : CPT4 Code Description Modifier [id number] - WC PHYS LEVEL 3 - EST PT ICD-10 Diagnosis Description L97.522 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.621 Type 2 diabetes mellitus with foot ulcer Z89.422 Acquired  absence of other left toe(s) Quantity: 1 : [phone] [id number] - WC PHYS APPLY TOTAL CONTACT CAST ICD-10 Diagnosis Description L97.522 Non-pressure chronic ulcer of other part of left foot with fat layer exposed Quantity: 1 Electronic Signature(s) Signed: 11/18/2023 8:26:52 AM By: [name] MD FACS Entered By: [name] on 11/18/2023 05:26:52

## 2023-11-18 NOTE — Progress Notes (Signed)
IORI, CARACAPPA ([id number]) [id number] Page 1 of 7 Visit Report for 11/18/2023 Arrival Information Details Patient Name: Date of Service: [name] [place], [name] RO LD 11/18/2023 7:45 A M Medical Record Number: [id number] Patient Account Number: [id number] Date of Birth/Sex: Treating RN: 1971-06-06 (52 y.o. [name] Primary Care Alizey Noren: [name] Other Clinician: Referring Latrese Carolan: Treating Avelina Mcclurkin/Extender: [name] in Treatment: 42 Visit Information History Since Last Visit Added or deleted any medications: No Patient Arrived: Ambulatory Any new allergies or adverse reactions: No Arrival Time: 07:38 Had a fall or experienced change in No Accompanied By: self activities of daily living that may affect Transfer Assistance: None risk of falls: Patient Identification Verified: Yes Signs or symptoms of abuse/neglect since last visito No Secondary Verification Process Completed: Yes Hospitalized since last visit: No Patient Requires Transmission-Based Precautions: No Implantable device outside of the clinic excluding No Patient Has Alerts: Yes cellular tissue based products placed in the center Patient Alerts: 08/2021 ABI:1.2 TBI 0.79 since last visit: Has Dressing in Place as Prescribed: Yes Has Footwear/Offloading in Place as Prescribed: Yes Left: T Contact Cast otal Pain Present Now: No Electronic Signature(s) Signed: 11/18/2023 3:29:21 PM By: [name] Entered By: [name] on 11/18/2023 04:39:19 -------------------------------------------------------------------------------- Encounter Discharge Information Details Patient Name: Date of Service: [name], [name] 11/18/2023 7:45 A M Medical Record Number: [id number] Patient Account Number: [id number] Date of Birth/Sex: Treating RN: April 09, 1971 (52 y.o. [name] Primary Care Natali Lavallee: [name] Other  Clinician: Referring Annaliz Aven: Treating Tashea Othman/Extender: [name] in Treatment: 53 Encounter Discharge Information Items Discharge Condition: Stable Ambulatory Status: Ambulatory Discharge Destination: Home Transportation: Private Auto Accompanied By: self Schedule Follow-up Appointment: Yes Clinical Summary of Care: Patient Declined Electronic Signature(s) Signed: 11/18/2023 3:29:21 PM By: [name] Entered By: [name] on 11/18/2023 05:20:31 [name] ([id number]) [id number] Page 2 of 7 -------------------------------------------------------------------------------- Lower Extremity Assessment Details Patient Name: Date of Service: [name] [place], [name] RO LD 11/18/2023 7:45 A M Medical Record Number: [id number] Patient Account Number: [id number] Date of Birth/Sex: Treating RN: February 22, 1971 (52 y.o. [name] Primary Care Bettyjane Shenoy: [name] Other Clinician: Referring Kemon Devincenzi: Treating Glendy Barsanti/Extender: [name] in Treatment: 42 Edema Assessment Assessed: [name]: No] [name]: No] Edema: [Left: N] [Right: o] Calf Left: Right: Point of Measurement: 43 cm From Medial Instep 36 cm Ankle Left: Right: Point of Measurement: 9 cm From Medial Instep 21.3 cm Vascular Assessment Pulses: Dorsalis Pedis Palpable: [Left:Yes] Extremity colors, hair growth, and conditions: Extremity Color: [Left:Normal] Hair Growth on Extremity: [Left:Yes] Temperature of Extremity: [Left:Warm] Capillary Refill: [Left:< 3 seconds] Dependent Rubor: [Left:No No] Electronic Signature(s) Signed: 11/18/2023 3:29:21 PM By: [name] Entered By: [name] on 11/18/2023 04:51:22 -------------------------------------------------------------------------------- Multi Wound Chart Details Patient Name: Date of Service: [name], [name] 11/18/2023 7:45 A M Medical  Record Number: [id number] Patient Account Number: [id number] Date of Birth/Sex: Treating RN: 28-Jun-1971 (52 y.o. M) Primary Care Awanda Wilcock: [name] Other Clinician: Referring Barry Faircloth: Treating Marquin Patino/Extender: [name] in Treatment: 42 Vital Signs Height(in): 78 Pulse(bpm): 85 Weight(lbs): 275 Blood Pressure(mmHg): 121/83 Body Mass Index(BMI): 31.8 Temperature(F): 97.9 Respiratory Rate(breaths/min): 16 [3:Photos:] [N/A:N/A] Left, Plantar Foot N/A N/A Wound Location: Gradually Appeared N/A N/A Wounding Event: Diabetic Wound/Ulcer of the Lower N/A N/A Primary Etiology: Extremity Chronic sinus problems/congestion, N/A N/A Comorbid History: Congestive Heart Failure, Coronary Artery Disease, Hypertension, Myocardial Infarction, Type II Diabetes, Neuropathy 10/01/2022 N/A N/A Date Acquired: 77 N/A  N/A Weeks of Treatment: Open N/A N/A Wound Status: No N/A N/A Wound Recurrence: Yes N/A N/A Pending [name] on Presentation: 0x0x0 N/A N/A Measurements L x W x D (cm) 0 N/A N/A A (cm) : rea 0 N/A N/A Volume (cm) : 100.00% N/A N/A % Reduction in A rea: 100.00% N/A N/A % Reduction in Volume: Grade 1 N/A N/A Classification: None Present N/A N/A Exudate A mount: Thickened N/A N/A Wound Margin: None Present (0%) N/A N/A Granulation A mount: None Present (0%) N/A N/A Necrotic A mount: Fascia: No N/A N/A Exposed Structures: Fat Layer (Subcutaneous Tissue): No Tendon: No Muscle: No Joint: No Bone: No Large (67-100%) N/A N/A Epithelialization: Callus: Yes N/A N/A Periwound Skin Texture: Excoriation: No Induration: No Crepitus: No Rash: No Scarring: No Maceration: Yes N/A N/A Periwound Skin Moisture: Dry/Scaly: No Atrophie Blanche: No N/A N/A Periwound Skin Color: Cyanosis: No Ecchymosis: No Erythema: No Hemosiderin Staining: No Mottled: No Pallor: No Rubor: No No Abnormality N/A N/A Temperature: T Contact  Cast otal N/A N/A Procedures Performed: Treatment Notes Wound #3 (Foot) Wound Laterality: Plantar, Left Cleanser Soap and Water Discharge Instruction: May shower and wash wound with dial antibacterial soap and water prior to dressing change. Peri-Wound Care Topical Primary Dressing Secondary Dressing Woven Gauze Sponge, Non-Sterile 4x4 in Discharge Instruction: Apply over primary dressing as directed. Secured With 17M Medipore H Soft Cloth Surgical T ape, 4 x 10 (in/yd) Discharge Instruction: Secure with tape as directed. Compression Wrap Compression Stockings Add-Ons DEMONIE, DRUMWRIGHT ([id number]) [id number] Page 4 of 7 Electronic Signature(s) Signed: 11/18/2023 8:22:18 AM By: [name] MD FACS Entered By: [name] on 11/18/2023 05:22:18 -------------------------------------------------------------------------------- Multi-Disciplinary Care Plan Details Patient Name: Date of Service: [name], [name] 11/18/2023 7:45 A M Medical Record Number: [id number] Patient Account Number: [id number] Date of Birth/Sex: Treating RN: [birth year] (52 y.o. [name] Primary Care Arlean Thies: [name] Other Clinician: Referring Tykeem Lanzer: Treating Abhiram Criado/Extender: [name] in Treatment: 42 Active Inactive Wound/Skin Impairment Nursing Diagnoses: Impaired tissue integrity Knowledge deficit related to ulceration/compromised skin integrity Goals: Patient will have a decrease in wound volume by X% from date: (specify in notes) Date Initiated: 01/24/2023 Target Resolution Date: 12/30/2023 Goal Status: Active Patient/caregiver will verbalize understanding of skin care regimen Date Initiated: 01/24/2023 Target Resolution Date: 12/30/2023 Goal Status: Active Ulcer/skin breakdown will have a volume reduction of 30% by week 4 Date Initiated: 01/24/2023 Date Inactivated: 03/07/2023 Target Resolution Date:  02/27/2023 Goal Status: Unmet Unmet Reason: larger today. Ulcer/skin breakdown will have a volume reduction of 50% by week 8 Date Initiated: 01/24/2023 Target Resolution Date: 12/30/2023 Goal Status: Active Interventions: Assess patient/caregiver ability to obtain necessary supplies Assess patient/caregiver ability to perform ulcer/skin care regimen upon admission and as needed Assess ulceration(s) every visit Notes: Electronic Signature(s) Signed: 11/18/2023 3:29:21 PM By: [name] Entered By: [name] on 11/18/2023 05:01:28 -------------------------------------------------------------------------------- Pain Assessment Details Patient Name: Date of Service: [name], [name] 11/18/2023 7:45 A M Medical Record Number: [id number] Patient Account Number: [id number] Date of Birth/Sex: Treating RN: 1971-10-06 (52 y.o. [name] Primary Care Darryle Dennie: [name] Other Clinician: Referring Astou Lada: Treating Greysen Swanton/Extender: [name] in Treatment: [address] [place] ([id number]) [id number] Page 5 of 7 Active Problems Location of Pain Severity and Description of Pain Patient Has Paino No Site Locations Rate the pain. Current Pain Level: 0 Pain Management and Medication Current Pain Management: Electronic Signature(s) Signed: 11/18/2023 3:29:21 PM By: Gelene Mink  By: [name] on 11/18/2023 04:39:28 -------------------------------------------------------------------------------- Patient/Caregiver Education Details Patient Name: Date of Service: [name], [name] RO LD 11/19/2024andnbsp7:45 A M Medical Record Number: [id number] Patient Account Number: [id number] Date of Birth/Gender: Treating RN: 08-12-1971 (52 y.o. [name] Primary Care Physician: [name] Other Clinician: Referring Physician: Treating Physician/Extender: [name] in Treatment: 29 Education Assessment Education Provided To: Patient Education Topics Provided Offloading: Methods: Explain/Verbal Responses: Reinforcements needed, State content correctly Electronic Signature(s) Signed: 11/18/2023 3:29:21 PM By: [name] Entered By: [name] on 11/18/2023 05:20:11 [name] ([id number]) [id number] Page 6 of 7 -------------------------------------------------------------------------------- Wound Assessment Details Patient Name: Date of Service: [name] [place], [name] RO LD 11/18/2023 7:45 A M Medical Record Number: [id number] Patient Account Number: [id number] Date of Birth/Sex: Treating RN: [birth year] (52 y.o. [name] Primary Care Salia Cangemi: [name] Other Clinician: Referring Ahmira Boisselle: Treating Kemesha Mosey/Extender: [name] in Treatment: 42 Wound Status Wound Number: 3 Primary Diabetic Wound/Ulcer of the Lower Extremity Etiology: Wound Location: Left, Plantar Foot Wound Open Wounding Event: Gradually Appeared Status: Date Acquired: 10/01/2022 Comorbid Chronic sinus problems/congestion, Congestive Heart Failure, Weeks Of Treatment: 42 History: Coronary Artery Disease, Hypertension, Myocardial Infarction, Type Clustered Wound: No II Diabetes, Neuropathy Pending Amputation On Presentation Wound under treatment by Cynde Menard outside of Wound Center Photos Wound Measurements Length: (cm) Width: (cm) Depth: (cm) Area: (cm) Volume: (cm) 0 % Reduction in Area: 100% 0 % Reduction in Volume: 100% 0 Epithelialization: Large (67-100%) 0 Tunneling: No 0 Undermining: No Wound Description Classification: Grade 1 Wound Margin: Thickened Exudate Amount: None Present Foul Odor After Cleansing: No Slough/Fibrino No Wound Bed Granulation Amount: None Present (0%) Exposed Structure Necrotic Amount: None Present (0%) Fascia  Exposed: No Fat Layer (Subcutaneous Tissue) Exposed: No Tendon Exposed: No Muscle Exposed: No Joint Exposed: No Bone Exposed: No Periwound Skin Texture Texture Color No Abnormalities Noted: No No Abnormalities Noted: Yes Callus: Yes Temperature / Pain Crepitus: No Temperature: No Abnormality Excoriation: No Induration: No Rash: No Scarring: No Moisture No Abnormalities Noted: Yes Treatment Notes Wound #3 (Foot) Wound Laterality: Plantar, Left Cleanser Soap and Water Discharge Instruction: May shower and wash wound with dial antibacterial soap and water prior to dressing change. MALIKI, HANDFIELD ([id number]) [id number] Page 7 of 7 Peri-Wound Care Topical Primary Dressing Secondary Dressing Woven Gauze Sponge, Non-Sterile 4x4 in Discharge Instruction: Apply over primary dressing as directed. Secured With 31M Medipore H Soft Cloth Surgical T ape, 4 x 10 (in/yd) Discharge Instruction: Secure with tape as directed. Compression Wrap Compression Stockings Add-Ons Electronic Signature(s) Signed: 11/18/2023 3:29:21 PM By: [name] Entered By: [name] on 11/18/2023 04:52:57 -------------------------------------------------------------------------------- Vitals Details Patient Name: Date of Service: [name], [name] 11/18/2023 7:45 A M Medical Record Number: [id number] Patient Account Number: [id number] Date of Birth/Sex: Treating RN: 04-03-1971 (52 y.o. [name] Primary Care France Noyce: [name] Other Clinician: Referring Deuce Paternoster: Treating Ayelen Sciortino/Extender: [name] in Treatment: 42 Vital Signs Time Taken: 07:42 Temperature (F): 97.9 Height (in): 78 Pulse (bpm): 85 Weight (lbs): 275 Respiratory Rate (breaths/min): 16 Body Mass Index (BMI): 31.8 Blood Pressure (mmHg): 121/83 Reference Range: 80 - 120 mg / dl Electronic Signature(s) Signed: 11/18/2023 3:29:21 PM By:  [name] Entered By: [name] on 11/18/2023 04:43:03

## 2023-11-25 ENCOUNTER — Encounter (HOSPITAL_BASED_OUTPATIENT_CLINIC_OR_DEPARTMENT_OTHER): Payer: 59 | Admitting: General Surgery

## 2023-11-25 ENCOUNTER — Other Ambulatory Visit: Payer: Self-pay | Admitting: Family Medicine

## 2023-11-25 ENCOUNTER — Other Ambulatory Visit: Payer: Self-pay

## 2023-11-25 DIAGNOSIS — I130 Hypertensive heart and chronic kidney disease with heart failure and stage 1 through stage 4 chronic kidney disease, or unspecified chronic kidney disease: Secondary | ICD-10-CM | POA: Diagnosis not present

## 2023-11-25 DIAGNOSIS — E1169 Type 2 diabetes mellitus with other specified complication: Secondary | ICD-10-CM

## 2023-11-25 DIAGNOSIS — E11621 Type 2 diabetes mellitus with foot ulcer: Secondary | ICD-10-CM | POA: Diagnosis not present

## 2023-11-25 DIAGNOSIS — L97522 Non-pressure chronic ulcer of other part of left foot with fat layer exposed: Secondary | ICD-10-CM | POA: Diagnosis not present

## 2023-11-25 MED ORDER — ATORVASTATIN CALCIUM 80 MG PO TABS
80.0000 mg | ORAL_TABLET | Freq: Every evening | ORAL | 1 refills | Status: DC
  Filled 2023-11-25: qty 30, 30d supply, fill #0
  Filled 2023-12-23: qty 30, 30d supply, fill #1
  Filled 2024-01-22: qty 30, 30d supply, fill #2
  Filled 2024-01-26: qty 30, 30d supply, fill #0
  Filled 2024-02-19: qty 30, 30d supply, fill #1
  Filled 2024-03-21: qty 60, 60d supply, fill #2

## 2023-11-25 NOTE — Telephone Encounter (Signed)
Requested medications are due for refill today.  yes  Requested medications are on the active medications list.  yes  Last refill. 09/04/2023 #30 1 rf  Future visit scheduled.   yes  Notes to clinic.  Labs are expired.    Requested Prescriptions  Pending Prescriptions Disp Refills   atorvastatin (LIPITOR) 80 MG tablet 30 tablet 1    Sig: Take 1 tablet (80 mg total) by mouth every evening.     Cardiovascular:  Antilipid - Statins Failed - 11/25/2023  8:25 AM      Failed - Lipid Panel in normal range within the last 12 months    Cholesterol, Total  Date Value Ref Range Status  08/27/2022 184 100 - 199 mg/dL Final   LDL Chol Calc (NIH)  Date Value Ref Range Status  08/27/2022 112 (H) 0 - 99 mg/dL Final   Direct LDL  Date Value Ref Range Status  06/14/2013 117.0 mg/dL Final    Comment:    Optimal:  <100 mg/dLNear or Above Optimal:  100-129 mg/dLBorderline High:  130-159 mg/dLHigh:  160-189 mg/dLVery High:  >190 mg/dL   HDL  Date Value Ref Range Status  08/27/2022 42 >39 mg/dL Final   Triglycerides  Date Value Ref Range Status  08/27/2022 169 (H) 0 - 149 mg/dL Final         Passed - Patient is not pregnant      Passed - Valid encounter within last 12 months    Recent Outpatient Visits           1 month ago Uncontrolled type 2 diabetes mellitus with hyperglycemia (HCC)   [facility] - A Dept Of [place]. [facility] Hoy Register, MD   6 months ago Uncontrolled type 2 diabetes mellitus with hyperglycemia [facility])   [facility] - A Dept Of [place]. [facility] Hoy Register, MD   12 months ago Uncontrolled type 2 diabetes mellitus with hyperglycemia [facility])   [facility] - A Dept Of [place]. [facility] Storm Frisk, MD   1 year ago Uncontrolled type 2 diabetes mellitus with hyperglycemia [facility])   [facility] - A Dept Of [place]. [facility]  Storm Frisk, MD   1 year ago Diabetes mellitus type 2 in obese [facility])   [facility] - A Dept Of [place]. [facility] Storm Frisk, MD       Future Appointments             In 2 months Hoy Register, MD [facility] - A Dept Of Eligha Bridegroom. [facility]   In 2 months Shamleffer, Konrad Dolores, MD [facility] Endocrinology

## 2023-11-25 NOTE — Progress Notes (Signed)
Glenn, [name] ([id number]) [id number] Page 1 of 10 Visit Report for 11/25/2023 Chief Complaint Document Details Patient Name: Date of Service: [name] [place], [name] RO LD 11/25/2023 7:45 A M Medical Record Number: [id number] Patient Account Number: [id number] Date of Birth/Sex: Treating RN: Oct 20, 1971 (52 y.o. M) Primary Care Provider: Shan [name] Other Clinician: Referring Provider: Treating Provider/Extender: [name] in Treatment: 37 Information Obtained from: Patient Chief Complaint 01/15/2023; left foot wound Electronic Signature(s) Signed: 11/25/2023 7:59:17 AM By: [name] MD FACS Entered By: [name] on 11/25/2023 07:59:16 -------------------------------------------------------------------------------- HPI Details Patient Name: Date of Service: [name] [place], [name] 11/25/2023 7:45 A M Medical Record Number: [id number] Patient Account Number: [id number] Date of Birth/Sex: Treating RN: 1971-12-29 (52 y.o. M) Primary Care Provider: Shan [name] Other Clinician: Referring Provider: Treating Provider/Extender: [name] in Treatment: 78 History of Present Illness HPI Description: ADMISSION 01/11/2020 This is a 52 year old man with uncontrolled type 2 diabetes with a recent hemoglobin A1c earlier this year of 13.4. He is on insulin and glipizide. He does not take his blood sugars at home. He does have a follow-up with primary care later this month I believe on January 27. He tells [place] that roughly a month ago he was walking with a shoe with a hole in his foot. He took the shoe off and there was an open wound at roughly the left fourth met head. This has significant undermining and raised edges. He has not noticed any purulence he does not feel unwell. More recently he was taking skin off the bottom of his foot and has a superficial area on the left fifth met head. He has not  been offloading this. The patient was in the ER on 12/20. They gave him Bactroban which she has been using on the wound and 10 days worth of doxycycline. No x-rays were done. He has not had vascular studies. He is also been using hydrogen peroxide. Past medical history type 2 diabetes uncontrolled, chronic systolic heart failure, coronary artery disease with a history of congestive heart failure with stents. Hypertension hyperlipidemia and chronic renal insufficiency ABI in this clinic was 1.14 on the left. 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 He is uncertain whether he would be able to work if we put him on some form of restriction 1/19; he is generally doing quite well. Using silver alginate on the wounds. Things actually look better. He has a forefoot offloading boot which she seems to be compliant about. He has support at work to stay off his foot is much as possible which is gratifying. Culture I did last week showed a few Enterococcus faecalis. I am going to put him on Augmentin. I talked about ordering an x-ray in my note last week but that does not seem to have happened. We will review reorder the x-ray this week. 1/26; x-ray reordered last week was negative for osteomyelitis. We are using silver alginate on the wound on the third and fifth met heads on the left. He is using a Darco forefoot off loader 2/2; the area on the fifth met head is closed. Third met head is still open with tunneling depth and thick callus. 2/9; the area on the fifth met head remains closed however the third met head again has a small open area on presentation with tunneling in depth and surrounded by thick callus. This looks like a pressure issue. We have been using silver  alginate 2/16; the area of the fifth metatarsal head remains closed however the area over the third metatarsal head again is a small open area but with some depth. I do not think this is changed much since last  week. He is using Hydrofera Blue with forefoot off loader. He is not able to use a total contact cast on the left leg DEXTER, [name] ([id number]) [id number] Page 2 of 10 because he needs his left leg at work [employer]]. Fortunately the wound does not look infected. I changed him to endoform today 2/26; the area of the fifth metatarsal head remains closed. The area of the third metatarsal head has an even smaller opening this time. I used endoform on this this looks improved. He is offloading this is much as he can and a forefoot off loader on the left. He cannot have a total contact cast because of work responsibilities 3/5; the area on the fifth metatarsal head remains closed the area on the third metatarsal head is also closed on the left foot. 01/15/2023 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 He has been following for podiatry for several months for this issue. He is currently using wet-to-dry saline dressings. He currently denies signs of infection. Progresse note from 1/17; Patient presents with a 45-month history of nonhealing ulcer to the left foot secondary to diabetes and inability to offload well the area. He has had multiple debridements in the past to his feet bilaterally. He has had resection of the fourth left met head in the fifth toe Secondary to osteomyelitis. We discussed the importance of glycemic control for wound healing. Due to his blood glucose levels being elevated he is at high risk for infection and thus further amputation. He expressed understanding. He states he is supposed to be referred  to an endocrinologist at [facility] however the referral fell through. Unclear what happened. Offered a referral to endocrinology at [facility]. Patient was agreeable.Furthermore we discussed the importance of aggressive offloading for his wound healing. This will be the most difficult part of the treatment plan for the patient to do. We discussed a total contact cast however he has declined that at this time. 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 He is currently using a surgical shoe with a peg assist. It does not fit well so we will give him a new one today. I do not think this is enough offloading. If he is not able to offload this area he will likely end up with a BKA. He is well aware of this. For now I recommended Medihoney and Hydrofera Blue for dressing changes. He will follow-up in our [place] office Since this is closer for him. 1/26; patient presents for follow-up. Last clinic visit I had seen him in [place] and we transferred him to [place] since this is a closer location for him. Progress note above from that visit. Over the past week He has been using Medihoney and Hydrofera Blue to the wound bed. He has been using his surgical shoe with peg assist. He has no issues or complaints today.  We discussed doing the total contact cast and he was agreeable to having this placed at next clinic visit. 2/2; patient presents for follow-up. He has been using Vashe wet-to-dry dressings. Plan is for the total contact cast today. 2/5; patient presents for follow-up. The cast was placed 3 days ago. He tolerated this well however had a lot of drainage. He currently denies signs of infection. 2/9; patient presents for follow-up. At last clinic visit we held off on replacing the cast due to drainage. He has been using Vashe wet-to-dry dressings and he is currently taking the oral antibiotics prescribed without issues. He declines a  total contact cast today. We ran insurance verification for skin substitute and due to cost patient declines having this placed. 2/16; patient presents for follow-up. He has decided not to follow-up with podiatry. He continues to decline the total contact cast. He has been using Vashe wet- to-dry dressings. He has not been wearing his surgical shoe with peg assist. He currently denies signs of infection. 3/1; patient presents for follow-up. He has been doing Vashe wet-to-dry dressings. He has a surgical shoe with peg assist. We discussed potentially doing a skin substitute for which she has been approved for by insurance. He knows the out-of-pocket cost of this and would still like to proceed with having this placed today. He denies the current total contact cast. 3/8; patient presents for follow-up. He states that the skin substitute came off after a few days and he has been using Vashe wet-to-dry dressings. He has mild odor to the wound bed on exam. 3/15; patient presents for follow-up. He has been using Vashe wet-to-dry dressings and started his course of Augmentin. He reports improvement in wound healing. He has no issues or complaints today. 3/21; patient presents for follow-up. He continues to use Vashe wet-to-dry dressings. He has no issues or complaints today. 3/28; patient presents for follow-up. He has been using Vashe wet-to-dry dressings. He has no issues or complaints today. He has been using his peg assist with surgical shoe. 4/11; patient presents for follow-up. He has been using Vashe wet-to-dry dressings. The periwound is macerated. He has been using his peg assist with surgical shoe. He has no issues or complaints today. 4/25; patient presents for follow-up. Has been using Vashe wet-to-dry dressings. He has been using zinc oxide to the periwound. For offloading he has been using his peg assist in surgical shoe. He has no issues or complaints today. 5/9; patient presents for  follow-up. He has been using Vashe wet-to-dry dressings. He has no issues or complaints today. He has a surgical shoe with peg assist for offloading. 5/23; patient presents for follow up. He has been using Vashe to clean the wound bed and collagen and blast X with dressing changes. [place] the [place] 2 [place]. Wound is bigger. 5/31; patient presents for follow-up. He has been using Vashe wet-to-dry dressings. Wound is stable but with healthier granulation tissue. Patient would like to hold off on the total contact cast and try a knee scooter for offloading. 6/13; patient presents for follow-up. He has been using Vashe wet-to-dry dressings. He has been using a knee scooter for offloading however states he felt unstable on this and decided to buy an electric scooter. This arrived in the mail today. He would like to hold off on the left total contact cast. Wound is slightly smaller today. 6/27; patient presents for follow-up. He has been using Vashe wet-to-dry dressings. He is using an Art gallery manager  for mobility to help relieve the pressure off the wound bed. Wound is smaller today. He wants to hold off on doing the total contact cast as he is showing signs of improvement with wound healing. 7/11; patient presents for follow-up. He has been using Vashe wet-to-dry dressings and his electric scooter for offloading. Wound is slightly smaller. 7/25; patient presents for follow up. He has been using Vashe wet-to-dry dressings and he reports using his electric scooter for offloading. Wound is larger. 7/30; patient presents for follow-up. He has been using Vashe wet-to-dry dressings. He has taken Augmentin as prescribed without issues. Overall wound has healthier granulation tissue. 8/1; patient presents for follow-up. He presents for obligatory cast change. He tolerated the cast well and has no issues or complaints today. We have been using antibiotic ointment with Hydrofera Blue under the cast. Wound  is slightly smaller. 8/8; patient presents for follow-up. We have been using antibiotic ointment with Hydrofera Blue under the total contact cast. Wound is smaller. He has no issues or complaints today. 8/13; patient presents for follow-up. We have been using antibiotic ointment with Hydrofera Blue under the total contact cast. He comes in today because the top part of the cast over the foot has sunken as well as on the side. He wanted to assure that there were no issues. SOCTT, OBROCHTA ([id number]) [id number] Page 3 of 10 8/22; patient presents for follow-up. He has been using Hydrofera Blue to the wound bed. Previously we have been using a total contact cast. We do not have total contact casts due to manufacturing issues available for patients. He has no issues or complaints. He denies signs of infection. 8/27; patient presents for follow-up. He has been using Vashe wet-to-dry dressings. T contact cast is available for placement today. He was agreeable to otal proceed with this. He denies signs of infection. 9/3; patient presents for follow-up. We have been using Hydrofera Blue with antibiotic ointment under the total contact cast for the past week. He has tolerated this well. The wound is smaller. Unfortunately we do not have a total contact cast in office today for replacement as there is been manufacturing issues. 9/5; patient was seen earlier this week and plan was for a total contact cast but there is none available. The cast finally arrived in patient has come in today to have this placed. We will continue with Hydrofera Blue and antibiotic ointment under the cast. 9/12; patient presents for follow-up. We have been using Hydrofera Blue and antibiotic ointment under the cast. Wound is stable. He denies signs of infection. 09/17/2023: The wound is measuring smaller today. The surface appears healthy and there is minimal accumulation of slough. 09/23/2023: The wound  external measurements remain about the same, but it is shallower and visually appears smaller. 09/30/2023: The wound is covered with a layer of callus. Underneath the callus, there is a tiny superficial residual opening. 10/14/2023: He returns after being away for 2 weeks, during which he was out of a cast. Not surprisingly, his wound has reopened substantially. It remains clean, however. 10/21/2023: We have recovered most of what was lost as far as progress with his absence from clinic. The wound did measure smaller today. It is a little bit macerated around the edges and he reports that he thinks some water got in over the top of his cast while showering. 10/28/2023: The wound measures smaller today. There is no tissue maceration. There is some slough on the surface and a little bit of callus  accumulation around the edges. 11/04/2023: The wound measured a little bit deeper today and had some undermining, but this seems to be due to mainly to accumulated callus. The surface has a bit of slough. He has been approved for Grafix so we will plan to apply that today. 11/10/2023: He has responded beautifully to Grafix application. His wound is down to just a couple of millimeters. There is some slough on the surface. 11/18/2023: His wound is freshly healed. 11/25/2023: After an additional week in the cast, the skin of the healed surface has greater strength and integrity. Electronic Signature(s) Signed: 11/25/2023 8:00:04 AM By: [name] MD FACS Entered By: [name] on 11/25/2023 08:00:03 -------------------------------------------------------------------------------- Physical Exam Details Patient Name: Date of Service: [name], [name] 11/25/2023 7:45 A M Medical Record Number: [id number] Patient Account Number: [id number] Date of Birth/Sex: Treating RN: [birth year] (52 y.o. M) Primary Care Provider: Shan [name] Other Clinician: Referring Provider: Treating Provider/Extender:  [name] in Treatment: 1 Constitutional . . . . no acute distress. Respiratory Normal work of breathing on room air.. Notes 11/25/2023: After an additional week in the cast, the skin of the healed surface has greater strength and integrity. Electronic Signature(s) Signed: 11/25/2023 8:00:39 AM By: [name] MD FACS Entered By: [name] on 11/25/2023 08:00:39 [name] ([id number]) [id number] Page 4 of 10 -------------------------------------------------------------------------------- Physician Orders Details Patient Name: Date of Service: [name] [place], [name] RO LD 11/25/2023 7:45 A M Medical Record Number: [id number] Patient Account Number: [id number] Date of Birth/Sex: Treating RN: [birth year] (52 y.o. [name] Primary Care Provider: Shan [name] Other Clinician: Referring Provider: Treating Provider/Extender: [name] in Treatment: 41 The following information was scribed by: [name] The information was scribed for: [name] Verbal / Phone Orders: No Diagnosis Coding ICD-10 Coding Code Description [phone] 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.621 Type 2 diabetes mellitus with foot ulcer Z89.422 Acquired absence of other left toe(s) Discharge From [facility] Discharge from [facility] - check feet daily pad this area for protection for life purchase orthotics shoes and insoles call if any future wound care needs. Electronic Signature(s) Unsigned Previous Signature: 11/25/2023 8:00:56 AM Version By: [name] MD FACS Entered By: [name] on 11/25/2023 08:01:35 -------------------------------------------------------------------------------- Problem List Details Patient Name: Date of Service: [name] [place], [name] 11/25/2023 7:45 A M Medical Record Number:  [id number] Patient Account Number: [id number] Date of Birth/Sex: Treating RN: [birth year] (52 y.o. M) Primary Care Provider: Shan [name] Other Clinician: Referring Provider: Treating Provider/Extender: [name] in Treatment: 64 Active Problems ICD-10 Encounter Code Description Active Date MDM Diagnosis [phone] Non-pressure chronic ulcer of other part of left foot with fat layer exposed 01/24/2023 No Yes E11.621 Type 2 diabetes mellitus with foot ulcer 01/24/2023 No Yes Z89.422 Acquired absence of other left toe(s) 01/24/2023 No Yes Inactive Problems Resolved Problems TAIMAK, PARDEE ([id number]) [id number] Page 5 of 10 Electronic Signature(s) Signed: 11/25/2023 7:58:31 AM By: [name] MD FACS Entered By: [name] on 11/25/2023 07:58:31 -------------------------------------------------------------------------------- Progress Note Details Patient Name: Date of Service: [name], [name] 11/25/2023 7:45 A M Medical Record Number: [id number] Patient Account Number: [id number] Date of Birth/Sex: Treating RN: [birth year] (52 y.o. M) Primary Care Provider: Shan [name] Other Clinician: Referring Provider: Treating Provider/Extender: [name] in Treatment: 35 Subjective Chief Complaint Information obtained from Patient 01/15/2023; left foot wound  History of Present Illness (HPI) ADMISSION 01/11/2020 This is a 52 year old man with uncontrolled type 2 diabetes with a recent hemoglobin A1c earlier this year of 13.4. He is on insulin and glipizide. He does not take his blood sugars at home. He does have a follow-up with primary care later this month I believe on January 27. He tells [place] that roughly a month ago he was walking with a shoe with a hole in his foot. He took the shoe off and there was an open wound at roughly the left fourth met head. This has significant undermining  and raised edges. He has not noticed any purulence he does not feel unwell. More recently he was taking skin off the bottom of his foot and has a superficial area on the left fifth met head. He has not been offloading this. The patient was in the ER on 12/20. They gave him Bactroban which she has been using on the wound and 10 days worth of doxycycline. No x-rays were done. He has not had vascular studies. He is also been using hydrogen peroxide. Past medical history type 2 diabetes uncontrolled, chronic systolic heart failure, coronary artery disease with a history of congestive heart failure with stents. Hypertension hyperlipidemia and chronic renal insufficiency ABI in this clinic was 1.14 on the left. 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 He is uncertain whether he would be able to work if we put him on some form of restriction 1/19; he is generally doing quite well. Using silver alginate on the wounds. Things actually look better. He has a forefoot offloading boot which she seems to be compliant about. He has support at work to stay off his foot is much as possible which is gratifying. Culture I did last week showed a few Enterococcus faecalis. I am going to put him on Augmentin. I talked about ordering an x-ray in my note last week but that does not seem to have happened. We will review reorder the x-ray this week. 1/26; x-ray reordered last week was negative for osteomyelitis. We are using silver alginate on the wound on the third and fifth met heads on the left. He is using a Darco forefoot off loader 2/2; the area on the fifth met head is closed. Third met head is still open with tunneling depth and thick callus. 2/9; the area on the fifth met head remains closed however the third met head again has a small open area on presentation with tunneling in depth and surrounded by thick callus. This looks like a pressure issue. We have been using silver  alginate 2/16; the area of the fifth metatarsal head remains closed however the area over the third metatarsal head again is a small open area but with some depth. I do not think this is changed much since last week. He is using Hydrofera Blue with forefoot off loader. He is not able to use a total contact cast on the left leg because he needs his left leg at work [employer]]. Fortunately the wound does not look infected. I changed him to endoform today 2/26; the area of the fifth metatarsal head remains closed. The area of the third metatarsal head has an even smaller opening this time. I used endoform on this this looks improved. He is offloading this is much as he can and a forefoot off loader on the left. He cannot have a total contact cast because of work responsibilities 3/5; the area  on the fifth metatarsal head remains closed the area on the third metatarsal head is also closed on the left foot. 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 He has been following for podiatry for several months for this issue. He is currently using wet-to-dry saline dressings. He currently denies signs of infection. Progresse note from 1/17; Patient presents with a 65-month history of nonhealing ulcer to the left foot secondary to diabetes and inability to offload well the area. He has had multiple debridements in the past to his feet bilaterally. He has had resection of the fourth left met head in the fifth toe Secondary to osteomyelitis. We discussed the importance of glycemic control for wound healing. Due to his blood glucose levels being elevated  he is at high risk for infection and thus further amputation. He expressed understanding. He states he is supposed to be referred to an endocrinologist at [facility] however the referral fell through. Unclear what happened. Offered a referral to endocrinology at [facility]. Patient was agreeable.Furthermore we discussed the importance of aggressive offloading for his wound healing. This will be the most difficult part of the treatment plan for the patient to do. We discussed a total contact cast however he has declined that at this time. 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 He is currently using a surgical shoe with a peg assist. It does not fit well so we will give him a new one today. I do not think this is enough offloading. If he is not able to offload this area he will likely end up with a BKA. He is well aware of this. For now I recommended Medihoney and Hydrofera Blue for dressing changes. He will follow-up in our [place] office Since this is closer for him. 1/26; patient presents for follow-up. Last clinic visit I had seen him in [place] and we transferred him to [place] since this is a closer location for him. Progress note above from that visit. Over the past week He has been using Medihoney and Hydrofera Blue to the wound bed. He has been using his surgical shoe with peg assist. He has no issues or complaints today. We discussed doing the total contact cast and he was agreeable to having this placed at next DEEGAN, MABEN ([id number]) [phone].pdf Page 6 of 10 clinic visit. 2/2; patient presents for follow-up. He has been using Vashe wet-to-dry dressings. Plan is for the total contact cast today. 2/5; patient presents for follow-up. The cast was placed 3 days ago. He tolerated this well however had a lot of drainage. He currently denies signs of infection. 2/9; patient presents for  follow-up. At last clinic visit we held off on replacing the cast due to drainage. He has been using Vashe wet-to-dry dressings and he is currently taking the oral antibiotics prescribed without issues. He declines a total contact cast today. We ran insurance verification for skin substitute and due to cost patient declines having this placed. 2/16; patient presents for follow-up. He has decided not to follow-up with podiatry. He continues to decline the total contact cast. He  has been using Vashe wet- to-dry dressings. He has not been wearing his surgical shoe with peg assist. He currently denies signs of infection. 3/1; patient presents for follow-up. He has been doing Vashe wet-to-dry dressings. He has a surgical shoe with peg assist. We discussed potentially doing a skin substitute for which she has been approved for by insurance. He knows the out-of-pocket cost of this and would still like to proceed with having this placed today. He denies the current total contact cast. 3/8; patient presents for follow-up. He states that the skin substitute came off after a few days and he has been using Vashe wet-to-dry dressings. He has mild odor to the wound bed on exam. 3/15; patient presents for follow-up. He has been using Vashe wet-to-dry dressings and started his course of Augmentin. He reports improvement in wound healing. He has no issues or complaints today. 3/21; patient presents for follow-up. He continues to use Vashe wet-to-dry dressings. He has no issues or complaints today. 3/28; patient presents for follow-up. He has been using Vashe wet-to-dry dressings. He has no issues or complaints today. He has been using his peg assist with surgical shoe. 4/11; patient presents for follow-up. He has been using Vashe wet-to-dry dressings. The periwound is macerated. He has been using his peg assist with surgical shoe. He has no issues or complaints today. 4/25; patient presents for follow-up. Has been  using Vashe wet-to-dry dressings. He has been using zinc oxide to the periwound. For offloading he has been using his peg assist in surgical shoe. He has no issues or complaints today. 5/9; patient presents for follow-up. He has been using Vashe wet-to-dry dressings. He has no issues or complaints today. He has a surgical shoe with peg assist for offloading. 5/23; patient presents for follow up. He has been using Vashe to clean the wound bed and collagen and blast X with dressing changes. [place] the [place]. Wound is bigger. 5/31; patient presents for follow-up. He has been using Vashe wet-to-dry dressings. Wound is stable but with healthier granulation tissue. Patient would like to hold off on the total contact cast and try a knee scooter for offloading. 6/13; patient presents for follow-up. He has been using Vashe wet-to-dry dressings. He has been using a knee scooter for offloading however states he felt unstable on this and decided to buy an electric scooter. This arrived in the mail today. He would like to hold off on the left total contact cast. Wound is slightly smaller today. 6/27; patient presents for follow-up. He has been using Vashe wet-to-dry dressings. He is using an Art gallery manager for mobility to help relieve the pressure off the wound bed. Wound is smaller today. He wants to hold off on doing the total contact cast as he is showing signs of improvement with wound healing. 7/11; patient presents for follow-up. He has been using Vashe wet-to-dry dressings and his electric scooter for offloading. Wound is slightly smaller. 7/25; patient presents for follow up. He has been using Vashe wet-to-dry dressings and he reports using his electric scooter for offloading. Wound is larger. 7/30; patient presents for follow-up. He has been using Vashe wet-to-dry dressings. He has taken Augmentin as prescribed without issues. Overall wound has healthier granulation tissue. 8/1;  patient presents for follow-up. He presents for obligatory cast change. He tolerated the cast well and has no issues or complaints today. We have been using antibiotic ointment with Hydrofera Blue under the cast. Wound is slightly smaller. 8/8;  patient presents for follow-up. We have been using antibiotic ointment with Hydrofera Blue under the total contact cast. Wound is smaller. He has no issues or complaints today. 8/13; patient presents for follow-up. We have been using antibiotic ointment with Hydrofera Blue under the total contact cast. He comes in today because the top part of the cast over the foot has sunken as well as on the side. He wanted to assure that there were no issues. 8/22; patient presents for follow-up. He has been using Hydrofera Blue to the wound bed. Previously we have been using a total contact cast. We do not have total contact casts due to manufacturing issues available for patients. He has no issues or complaints. He denies signs of infection. 8/27; patient presents for follow-up. He has been using Vashe wet-to-dry dressings. T contact cast is available for placement today. He was agreeable to otal proceed with this. He denies signs of infection. 9/3; patient presents for follow-up. We have been using Hydrofera Blue with antibiotic ointment under the total contact cast for the past week. He has tolerated this well. The wound is smaller. Unfortunately we do not have a total contact cast in office today for replacement as there is been manufacturing issues. 9/5; patient was seen earlier this week and plan was for a total contact cast but there is none available. The cast finally arrived in patient has come in today to have this placed. We will continue with Hydrofera Blue and antibiotic ointment under the cast. 9/12; patient presents for follow-up. We have been using Hydrofera Blue and antibiotic ointment under the cast. Wound is stable. He denies signs of  infection. 09/17/2023: The wound is measuring smaller today. The surface appears healthy and there is minimal accumulation of slough. 09/23/2023: The wound external measurements remain about the same, but it is shallower and visually appears smaller. 09/30/2023: The wound is covered with a layer of callus. Underneath the callus, there is a tiny superficial residual opening. 10/14/2023: He returns after being away for 2 weeks, during which he was out of a cast. Not surprisingly, his wound has reopened substantially. It remains clean, however. 10/21/2023: We have recovered most of what was lost as far as progress with his absence from clinic. The wound did measure smaller today. It is a little bit macerated around the edges and he reports that he thinks some water got in over the top of his cast while showering. OZIE, PHOU ([id number]) [id number] Page 7 of 10 10/28/2023: The wound measures smaller today. There is no tissue maceration. There is some slough on the surface and a little bit of callus accumulation around the edges. 11/04/2023: The wound measured a little bit deeper today and had some undermining, but this seems to be due to mainly to accumulated callus. The surface has a bit of slough. He has been approved for Grafix so we will plan to apply that today. 11/10/2023: He has responded beautifully to Grafix application. His wound is down to just a couple of millimeters. There is some slough on the surface. 11/18/2023: His wound is freshly healed. 11/25/2023: After an additional week in the cast, the skin of the healed surface has greater strength and integrity. Patient History Information obtained from Patient. Family History Heart Disease - Mother,Father, No family history of Cancer, Diabetes, Hereditary Spherocytosis, Hypertension, Kidney Disease, Lung Disease, Seizures, Stroke, Thyroid Problems, Tuberculosis. Social History Never smoker, Marital Status -  Single, Alcohol Use - Rarely, Drug Use - No History, Caffeine Use -  Rarely. 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 42 Denies history of Angina, Arrhythmia, Deep Vein Thrombosis, Hypotension, Peripheral Arterial Disease, Peripheral Venous Disease, Phlebitis, Vasculitis Gastrointestinal Denies history of Cirrhosis , Colitis, Crohns, Hepatitis A, Hepatitis B, Hepatitis C Endocrine Patient has history of Type II Diabetes Genitourinary Denies history of End Stage Renal Disease Immunological Denies history of Lupus Erythematosus, Raynauds, Scleroderma Integumentary (Skin) Denies history of History of Burn Musculoskeletal Denies history of Gout, Rheumatoid Arthritis, Osteoarthritis, Osteomyelitis Neurologic Patient has history of Neuropathy Denies history of Dementia, Quadriplegia, Paraplegia, Seizure Disorder Oncologic Denies history of Received Chemotherapy, Received Radiation Psychiatric Denies history of Anorexia/bulimia, Confinement Anxiety Hospitalization/Surgery History - Heart Cath in 2018. Medical A Surgical History Notes nd Cardiovascular Ischemic Cardiomyopathy Genitourinary Renal Insufficiency Objective Constitutional no acute distress. Vitals Time Taken: 7:47 AM, Height: 78 in, Weight: 275 lbs, BMI: 31.8, Temperature: 97.9 F, Pulse: 87 bpm, Respiratory Rate: 16 breaths/min, Blood Pressure: 117/85 mmHg. Respiratory Normal work of  breathing on room air.. General Notes: 11/25/2023: After an additional week in the cast, the skin of the healed surface has greater strength and integrity. STAFFORD, CHRISTEL ([id number]) [id number] Page 8 of 10 Integumentary (Hair, Skin) Wound #3 status is Open. Original cause of wound was Gradually Appeared. The date acquired was: 10/01/2022. The wound has been in treatment 43 weeks. The wound is located on the Left,Plantar Foot. The wound measures 0cm length x 0cm width x 0cm depth; 0cm^2 area and 0cm^3 volume. There is no tunneling or undermining noted. There is a none present amount of drainage noted. The wound margin is thickened. There is no granulation within the wound bed. There is no necrotic tissue within the wound bed. The periwound skin appearance had no abnormalities noted for moisture. The periwound skin appearance had no abnormalities noted for color. The periwound skin appearance exhibited: Callus. The periwound skin appearance did not exhibit: Crepitus, Excoriation, Induration, Rash, Scarring. Periwound temperature was noted as No Abnormality. 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 Electronic Signature(s) Signed: 11/25/2023 8:01:05 AM By: [name] MD FACS Entered By: [name] on 11/25/2023 08:01:05 -------------------------------------------------------------------------------- HxROS Details Patient Name: Date of Service: [name] [place], [name] 11/25/2023 7:45 A M Medical Record Number: [id number] Patient Account Number: [id number] Date of Birth/Sex: Treating RN: 1971/07/12 (52 y.o. M) Primary Care Provider: Shan [name] Other Clinician: Referring  Provider: Treating Provider/Extender: [name] in Treatment: 53 Information Obtained From Patient Eyes Medical History: Negative for: Cataracts; Glaucoma; Optic Neuritis Ear/Nose/Mouth/Throat Medical History: Positive for: Chronic sinus problems/congestion Negative for: Middle ear problems Hematologic/Lymphatic Medical History: Negative for: Anemia; Hemophilia; Human Immunodeficiency Virus; Lymphedema; Sickle Cell Disease Respiratory Medical History: Negative for: Aspiration; Asthma; Chronic Obstructive Pulmonary Disease (COPD); Pneumothorax; Sleep Apnea; Tuberculosis Cardiovascular Medical History: PATRIOT, SANE ([id number]) [id number] Page 9 of 10 Positive for: Congestive Heart Failure; Coronary Artery Disease; Hypertension; Myocardial Infarction - age 63 Negative  for: Angina; Arrhythmia; Deep Vein Thrombosis; Hypotension; Peripheral Arterial Disease; Peripheral Venous Disease; Phlebitis; Vasculitis Past Medical History Notes: Ischemic Cardiomyopathy Gastrointestinal Medical History: Negative for: Cirrhosis ; Colitis; Crohns; Hepatitis A; Hepatitis B; Hepatitis C Endocrine Medical History: Positive for: Type II Diabetes Time with diabetes: ten years Treated with: Insulin Blood sugar tested every day: No Genitourinary Medical History: Negative for: End Stage Renal Disease Past Medical History Notes: Renal Insufficiency Immunological Medical History: Negative for: Lupus Erythematosus; Raynauds; Scleroderma Integumentary (Skin) Medical History: Negative for: History of Burn Musculoskeletal Medical History: Negative for: Gout; Rheumatoid Arthritis; Osteoarthritis; Osteomyelitis Neurologic Medical History: Positive for: Neuropathy Negative for: Dementia; Quadriplegia; Paraplegia; Seizure Disorder Oncologic Medical History: Negative for: Received Chemotherapy; Received Radiation Psychiatric Medical  History: Negative for: Anorexia/bulimia; Confinement Anxiety HBO Extended History Items Ear/Nose/Mouth/Throat: Chronic sinus problems/congestion Immunizations Pneumococcal Vaccine: Received Pneumococcal Vaccination: No Implantable Devices None Hospitalization / Surgery History Type of Hospitalization/Surgery Heart Cath in 2018 Family and Social History Cancer: No; Diabetes: No; Heart Disease: Yes - Mother,Father; Hereditary Spherocytosis: No; Hypertension: No; Kidney Disease: No; Lung Disease: No; Seizures: No; Stroke: No; Thyroid Problems: No; Tuberculosis: No; Never smoker; Marital Status - Single; Alcohol Use: Rarely; Drug Use: No History; Caffeine Use: Rarely; Financial Concerns: No; Food, Clothing or Shelter Needs: No; Support System Lacking: No; Transportation Concerns: No HARVY, MAGNANO ([id number]) [id number] Page 10 of 10 Electronic Signature(s) Unsigned Entered By: [name] on 11/25/2023 08:00:10 -------------------------------------------------------------------------------- SuperBill Details Patient Name: Date of Service: [name] [place], [name] RO LD 11/25/2023 Medical Record Number: [id number] Patient Account Number: [id number] Date of Birth/Sex: Treating RN: 07-Oct-1971 (52 y.o. [name] Primary Care Provider: Shan [name] Other Clinician: Referring Provider: Treating Provider/Extender: [name] in Treatment: 43 Diagnosis Coding ICD-10 Codes Code Description [phone] 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.621 Type 2 diabetes mellitus with foot ulcer Z89.422 Acquired absence of other left toe(s) Facility Procedures : CPT4 Code: [id number] Description: 99213 - WOUND CARE VISIT-LEV 3 EST PT Modifier: Quantity: 1 Electronic Signature(s) Unsigned Entered By: [name] on 11/25/2023 08:02:16 Signature(s): Date(s):

## 2023-11-26 ENCOUNTER — Other Ambulatory Visit (HOSPITAL_COMMUNITY): Payer: Self-pay

## 2023-11-26 ENCOUNTER — Other Ambulatory Visit: Payer: Self-pay

## 2023-11-26 NOTE — Progress Notes (Signed)
[name], [name] ([id number]) [id number] Page 1 of 7 Visit Report for 11/25/2023 Arrival Information Details Patient Name: Date of Service: [name] [name], [name] RO LD 11/25/2023 7:45 A M Medical Record Number: [id number] Patient Account Number: [id number] Date of Birth/Sex: Treating RN: 1971/03/02 (52 y.o. [name] Primary Care [name]: [name] Other Clinician: Referring [name]: Treating [name]/Extender: [name] in Treatment: 71 Visit Information History Since Last Visit Added or deleted any medications: No Patient Arrived: Ambulatory Any new allergies or adverse reactions: No Arrival Time: 07:45 Had a fall or experienced change in No Accompanied By: self activities of daily living that may affect Transfer Assistance: None risk of falls: Patient Identification Verified: Yes Signs or symptoms of abuse/neglect since last visito No Secondary Verification Process Completed: Yes Hospitalized since last visit: No Patient Requires Transmission-Based Precautions: No Implantable device outside of the clinic excluding No Patient Has Alerts: Yes cellular tissue based products placed in the center Patient Alerts: 08/2021 ABI:1.2 TBI 0.79 since last visit: Has Dressing in Place as Prescribed: Yes Has Footwear/Offloading in Place as Prescribed: Yes Left: T Contact Cast otal Pain Present Now: No Electronic Signature(s) Signed: 11/25/2023 5:01:06 PM By: [name] Entered By: [name] on 11/25/2023 07:46:27 -------------------------------------------------------------------------------- Clinic Level of Care Assessment Details Patient Name: Date of Service: [name] [name], [name] RO LD 11/25/2023 7:45 A M Medical Record Number: [id number] Patient Account Number: [id number] Date of Birth/Sex: Treating RN: May 03, 1971 (52 y.o. [name] Primary Care [name]: [name] Other  Clinician: Referring [name]: Treating [name]/Extender: [name] in Treatment: 43 Clinic Level of Care Assessment Items TOOL 4 Quantity Score X- 1 0 Use when only an EandM is performed on FOLLOW-UP visit ASSESSMENTS - Nursing Assessment / Reassessment X- 1 10 Reassessment of Co-morbidities (includes updates in patient status) X- 1 5 Reassessment of Adherence to Treatment Plan ASSESSMENTS - Wound and Skin A ssessment / Reassessment X - Simple Wound Assessment / Reassessment - one wound 1 5 []  - 0 Complex Wound Assessment / Reassessment - multiple wounds X- 1 10 Dermatologic / Skin Assessment (not related to wound area) ASSESSMENTS - Focused Assessment []  - 0 Circumferential Edema Measurements - multi extremities [name], [name] ([id number]) [id number] Page 2 of 7 []  - 0 Nutritional Assessment / Counseling / Intervention []  - 0 Lower Extremity Assessment (monofilament, tuning fork, pulses) []  - 0 Peripheral Arterial Disease Assessment (using hand held doppler) ASSESSMENTS - Ostomy and/or Continence Assessment and Care []  - 0 Incontinence Assessment and Management []  - 0 Ostomy Care Assessment and Management (repouching, etc.) PROCESS - Coordination of Care X - Simple Patient / Family Education for ongoing care 1 15 []  - 0 Complex (extensive) Patient / Family Education for ongoing care X- 1 10 Staff obtains Chiropractor, Records, T Results / Process Orders est []  - 0 Staff telephones HHA, Nursing Homes / Clarify orders / etc []  - 0 Routine Transfer to another Facility (non-emergent condition) []  - 0 Routine Hospital Admission (non-emergent condition) []  - 0 New Admissions / Manufacturing engineer / Ordering NPWT Apligraf, etc. , []  - 0 Emergency Hospital Admission (emergent condition) X- 1 10 Simple Discharge Coordination []  - 0 Complex (extensive) Discharge Coordination PROCESS - Special Needs []  -  0 Pediatric / Minor Patient Management []  - 0 Isolation Patient Management []  - 0 Hearing / Language / Visual special needs []  - 0 Assessment of Community assistance (transportation, D/C planning, etc.) []  - 0 Additional assistance / Altered mentation []  -  0 Support Surface(s) Assessment (bed, cushion, seat, etc.) INTERVENTIONS - Wound Cleansing / Measurement X - Simple Wound Cleansing - one wound 1 5 []  - 0 Complex Wound Cleansing - multiple wounds X- 1 5 Wound Imaging (photographs - any number of wounds) []  - 0 Wound Tracing (instead of photographs) X- 1 5 Simple Wound Measurement - one wound []  - 0 Complex Wound Measurement - multiple wounds INTERVENTIONS - Wound Dressings []  - 0 Small Wound Dressing one or multiple wounds []  - 0 Medium Wound Dressing one or multiple wounds []  - 0 Large Wound Dressing one or multiple wounds []  - 0 Application of Medications - topical []  - 0 Application of Medications - injection INTERVENTIONS - Miscellaneous []  - 0 External ear exam []  - 0 Specimen Collection (cultures, biopsies, blood, body fluids, etc.) []  - 0 Specimen(s) / Culture(s) sent or taken to Lab for analysis []  - 0 Patient Transfer (multiple staff / Michiel Sites Lift / Similar devices) []  - 0 Simple Staple / Suture removal (25 or less) []  - 0 Complex Staple / Suture removal (26 or more) [name], [name] ([id number]) [id number] Page 3 of 7 []  - 0 Hypo / Hyperglycemic Management (close monitor of Blood Glucose) []  - 0 Ankle / Brachial Index (ABI) - do not check if billed separately X- 1 5 Vital Signs Has the patient been seen at the hospital within the last three years: Yes Total Score: 85 Level Of Care: New/Established - Level 3 Electronic Signature(s) Signed: 11/25/2023 5:01:06 PM By: [name] Entered By: [name] on 11/25/2023  08:02:12 -------------------------------------------------------------------------------- Encounter Discharge Information Details Patient Name: Date of Service: [name] IN, [name] 11/25/2023 7:45 A M Medical Record Number: [id number] Patient Account Number: [id number] Date of Birth/Sex: Treating RN: [birth year] (52 y.o. [name] Primary Care Yarielys Beed: [name] Other Clinician: Referring Lynniah Janoski: Treating Katieann Hungate/Extender: [name] in Treatment: 4 Encounter Discharge Information Items Discharge Condition: Stable Ambulatory Status: Ambulatory Discharge Destination: Home Transportation: Private Auto Accompanied By: self Schedule Follow-up Appointment: No Clinical Summary of Care: Notes pad the area for protection. Electronic Signature(s) Signed: 11/25/2023 5:01:06 PM By: Gelene Mink By: [name] on 11/25/2023 08:02:43 -------------------------------------------------------------------------------- Lower Extremity Assessment Details Patient Name: Date of Service: [name] [place], [name] RO LD 11/25/2023 7:45 A M Medical Record Number: [id number] Patient Account Number: [id number] Date of Birth/Sex: Treating RN: 1971-07-03 (52 y.o. [name] Primary Care Adis Sturgill: [name] Other Clinician: Referring Gregory Barrick: Treating Chike Farrington/Extender: [name] in Treatment: 43 Edema Assessment Assessed: [name]: No] [name]: No] Edema: [Left: N] [Right: o] Calf Left: Right: Point of Measurement: 43 cm From Medial Instep 37.5 cm Ankle Left: Right: VAISHNAV, MATTIELLO ([id number]) [id number] Page 4 of 7 Point of Measurement: 9 cm From Medial Instep 21.5 cm Vascular Assessment Pulses: Dorsalis Pedis Palpable: [Left:Yes] Extremity colors, hair growth, and conditions: Extremity Color: [Left:Normal] Hair Growth on Extremity: [Left:Yes] Temperature of  Extremity: [Left:Warm] Capillary Refill: [Left:< 3 seconds] Dependent Rubor: [Left:No No] Electronic Signature(s) Signed: 11/25/2023 5:01:06 PM By: [name] Entered By: [name] on 11/25/2023 07:53:03 -------------------------------------------------------------------------------- Multi Wound Chart Details Patient Name: Date of Service: [name] IN, [name] 11/25/2023 7:45 A M Medical Record Number: [id number] Patient Account Number: [id number] Date of Birth/Sex: Treating RN: [birth year] (52 y.o. M) Primary Care Jenipher Havel: [name] Other Clinician: Referring Bobbie Valletta: Treating Asuncion Tapscott/Extender: [name] in Treatment: 24 Vital Signs Height(in): 78 Pulse(bpm): 87 Weight(lbs): 275 Blood Pressure(mmHg): 117/85 Body Mass  Index(BMI): 31.8 Temperature(F): 97.9 Respiratory Rate(breaths/min): 16 [3:Photos:] [N/A:N/A] Left, Plantar Foot N/A N/A Wound Location: Gradually Appeared N/A N/A Wounding Event: Diabetic Wound/Ulcer of the Lower N/A N/A Primary Etiology: Extremity Chronic sinus problems/congestion, N/A N/A Comorbid History: Congestive Heart Failure, Coronary Artery Disease, Hypertension, Myocardial Infarction, Type II Diabetes, Neuropathy 10/01/2022 N/A N/A Date Acquired: 34 N/A N/A Weeks of Treatment: Open N/A N/A Wound Status: No N/A N/A Wound Recurrence: Yes N/A N/A Pending [name] on Presentation: 0x0x0 N/A N/A Measurements L x W x D (cm) 0 N/A N/A A (cm) : rea 0 N/A N/A Volume (cm) : 100.00% N/A N/A % Reduction in A rea: 100.00% N/A N/A % Reduction in Volume: Grade 1 N/A N/A Classification: None Present N/A N/A Exudate A mountHARTFORD, SKYBERG ([id number]) [id number] Page 5 of 7 Thickened N/A N/A Wound Margin: None Present (0%) N/A N/A Granulation Amount: None Present (0%) N/A N/A Necrotic Amount: Fascia: No N/A N/A Exposed Structures: Fat Layer  (Subcutaneous Tissue): No Tendon: No Muscle: No Joint: No Bone: No Large (67-100%) N/A N/A Epithelialization: Callus: Yes N/A N/A Periwound Skin Texture: Excoriation: No Induration: No Crepitus: No Rash: No Scarring: No Maceration: Yes N/A N/A Periwound Skin Moisture: Dry/Scaly: No Atrophie Blanche: No N/A N/A Periwound Skin Color: Cyanosis: No Ecchymosis: No Erythema: No Hemosiderin Staining: No Mottled: No Pallor: No Rubor: No No Abnormality N/A N/A Temperature: Treatment Notes Electronic Signature(s) Signed: 11/25/2023 7:58:39 AM By: [name] MD FACS Entered By: [name] on 11/25/2023 07:58:39 -------------------------------------------------------------------------------- Multi-Disciplinary Care Plan Details Patient Name: Date of Service: [name] IN, [name] 11/25/2023 7:45 A M Medical Record Number: [id number] Patient Account Number: [id number] Date of Birth/Sex: Treating RN: [birth year] (52 y.o. [name] Primary Care Litisha Guagliardo: [name] Other Clinician: Referring Dominque Levandowski: Treating Lillyahna Hemberger/Extender: [name] in Treatment: 39 Active Inactive Electronic Signature(s) Signed: 11/25/2023 5:01:06 PM By: Gelene Mink By: [name] on 11/25/2023 08:00:05 -------------------------------------------------------------------------------- Pain Assessment Details Patient Name: Date of Service: [name] IN, [name] 11/25/2023 7:45 A M Medical Record Number: [id number] Patient Account Number: [id number] Date of Birth/Sex: Treating RN: 1971/08/16 (52 y.o. [name] Primary Care Devante Capano: [name] Other Clinician: Referring Andrews Tener: Treating Olivine Hiers/Extender: [name] in Treatment: [address] [place], [name] ([id number]) [id number] Page 6 of 7 Location of Pain Severity and Description of  Pain Patient Has Paino No Site Locations Rate the pain. Current Pain Level: 0 Pain Management and Medication Current Pain Management: Electronic Signature(s) Signed: 11/25/2023 5:01:06 PM By: [name] Entered By: [name] on 11/25/2023 07:46:50 -------------------------------------------------------------------------------- Wound Assessment Details Patient Name: Date of Service: [name] IN, [name] 11/25/2023 7:45 A M Medical Record Number: [id number] Patient Account Number: [id number] Date of Birth/Sex: Treating RN: 08-Mar-1971 (52 y.o. [name] Primary Care Colonel Krauser: [name] Other Clinician: Referring Bard Haupert: Treating Carita Sollars/Extender: [name] in Treatment: 43 Wound Status Wound Number: 3 Primary Diabetic Wound/Ulcer of the Lower Extremity Etiology: Wound Location: Left, Plantar Foot Wound Open Wounding Event: Gradually Appeared Status: Date Acquired: 10/01/2022 Comorbid Chronic sinus problems/congestion, Congestive Heart Failure, Weeks Of Treatment: 43 History: Coronary Artery Disease, Hypertension, Myocardial Infarction, Type Clustered Wound: No II Diabetes, Neuropathy Pending Amputation On Presentation Wound under treatment by Herta Hink outside of Wound Center Photos Wound Measurements Length: (cm) 0 Width: (cm) 0 Depth: (cm) 0 Doria, Alijah ([id number]) Area: (cm) 0 Volume: (cm) 0 % Reduction in Area: 100% % Reduction in Volume: 100% Epithelialization:  Large (67-100%) [id number] Page 7 of 7 Tunneling: No Undermining: No Wound Description Classification: Grade 1 Wound Margin: Thickened Exudate Amount: None Present Foul Odor After Cleansing: No Slough/Fibrino No Wound Bed Granulation Amount: None Present (0%) Exposed Structure Necrotic Amount: None Present (0%) Fascia Exposed: No Fat Layer (Subcutaneous Tissue) Exposed: No Tendon Exposed: No Muscle  Exposed: No Joint Exposed: No Bone Exposed: No Periwound Skin Texture Texture Color No Abnormalities Noted: No No Abnormalities Noted: Yes Callus: Yes Temperature / Pain Crepitus: No Temperature: No Abnormality Excoriation: No Induration: No Rash: No Scarring: No Moisture No Abnormalities Noted: Yes Electronic Signature(s) Signed: 11/25/2023 5:01:06 PM By: [name] Entered By: [name] on 11/25/2023 07:54:39 -------------------------------------------------------------------------------- Vitals Details Patient Name: Date of Service: [name] IN, [name] 11/25/2023 7:45 A M Medical Record Number: [id number] Patient Account Number: [id number] Date of Birth/Sex: Treating RN: 1971/05/04 (52 y.o. [name] Primary Care Gao Mitnick: [name] Other Clinician: Referring Zackeriah Kissler: Treating Ayaana Biondo/Extender: [name] in Treatment: 61 Vital Signs Time Taken: 07:47 Temperature (F): 97.9 Height (in): 78 Pulse (bpm): 87 Weight (lbs): 275 Respiratory Rate (breaths/min): 16 Body Mass Index (BMI): 31.8 Blood Pressure (mmHg): 117/85 Reference Range: 80 - 120 mg / dl Electronic Signature(s) Signed: 11/25/2023 5:01:06 PM By: [name] Entered By: [name] on 11/25/2023 07:47:13

## 2023-12-02 ENCOUNTER — Other Ambulatory Visit: Payer: Self-pay

## 2023-12-02 ENCOUNTER — Other Ambulatory Visit: Payer: Self-pay | Admitting: Critical Care Medicine

## 2023-12-02 ENCOUNTER — Other Ambulatory Visit (HOSPITAL_COMMUNITY): Payer: Self-pay

## 2023-12-03 ENCOUNTER — Telehealth: Payer: Self-pay | Admitting: Podiatry

## 2023-12-03 ENCOUNTER — Encounter: Payer: Self-pay | Admitting: Family Medicine

## 2023-12-03 ENCOUNTER — Other Ambulatory Visit (HOSPITAL_COMMUNITY): Payer: Self-pay

## 2023-12-03 ENCOUNTER — Other Ambulatory Visit: Payer: Self-pay

## 2023-12-03 MED ORDER — DAPAGLIFLOZIN PROPANEDIOL 10 MG PO TABS
10.0000 mg | ORAL_TABLET | Freq: Every day | ORAL | 2 refills | Status: DC
Start: 2023-12-03 — End: 2024-03-01
  Filled 2023-12-03 – 2023-12-08 (×2): qty 30, 30d supply, fill #0
  Filled 2024-01-05 (×2): qty 30, 30d supply, fill #1
  Filled 2024-02-01 – 2024-02-05 (×6): qty 30, 30d supply, fill #2

## 2023-12-03 NOTE — Telephone Encounter (Signed)
Pt called and was scheduled to see Dr Jamse Arn and casting tomorrow for orthotics but was recommended to get diabetic shoes and inserts from wound care. He contacted his insurance and they gave him a name of [facility]. The shoes/inserts will be covered there. I have cxled the appts for tomorrow and pt will call if needed.

## 2023-12-04 ENCOUNTER — Ambulatory Visit: Payer: 59 | Admitting: Podiatry

## 2023-12-04 ENCOUNTER — Other Ambulatory Visit: Payer: 59

## 2023-12-08 ENCOUNTER — Other Ambulatory Visit (HOSPITAL_COMMUNITY): Payer: Self-pay

## 2023-12-08 ENCOUNTER — Other Ambulatory Visit: Payer: Self-pay

## 2023-12-10 ENCOUNTER — Other Ambulatory Visit (HOSPITAL_COMMUNITY): Payer: Self-pay

## 2023-12-10 ENCOUNTER — Other Ambulatory Visit (HOSPITAL_BASED_OUTPATIENT_CLINIC_OR_DEPARTMENT_OTHER): Payer: Self-pay

## 2023-12-10 ENCOUNTER — Telehealth: Payer: Self-pay | Admitting: Critical Care Medicine

## 2023-12-10 ENCOUNTER — Other Ambulatory Visit: Payer: Self-pay

## 2023-12-10 ENCOUNTER — Other Ambulatory Visit: Payer: Self-pay | Admitting: Family Medicine

## 2023-12-10 MED ORDER — MISC. DEVICES MISC: 0 refills | Status: AC

## 2023-12-10 NOTE — Telephone Encounter (Signed)
Copied from CRM [id number]. Topic: General - Other >> Dec 10, 2023  2:49 PM Ja-Kwan M wrote: Reason for CRM: Pt requests a Rx for diabetic shoes and inserts to be sent to [facility] at fax# [phone] ph# [phone]. Pt requests call back once the Rx has been sent. Cb# [phone]

## 2023-12-11 ENCOUNTER — Telehealth: Payer: Self-pay | Admitting: Pharmacy Technician

## 2023-12-11 DIAGNOSIS — E1169 Type 2 diabetes mellitus with other specified complication: Secondary | ICD-10-CM

## 2023-12-11 NOTE — Addendum Note (Signed)
Addended by: Tylene Fantasia on: 12/11/2023 10:12 AM   Modules accepted: Orders

## 2023-12-11 NOTE — Telephone Encounter (Signed)
Prior Authorization form/request asks a question that requires your assistance. Please see the question below and advise accordingly.  Last labs were 2023

## 2023-12-12 NOTE — Telephone Encounter (Signed)
See mychart message, Patent examiner the RX to email patient provided

## 2023-12-19 ENCOUNTER — Other Ambulatory Visit: Payer: Self-pay

## 2023-12-22 ENCOUNTER — Encounter (HOSPITAL_BASED_OUTPATIENT_CLINIC_OR_DEPARTMENT_OTHER): Payer: 59 | Attending: General Surgery | Admitting: General Surgery

## 2023-12-22 DIAGNOSIS — I2510 Atherosclerotic heart disease of native coronary artery without angina pectoris: Secondary | ICD-10-CM | POA: Insufficient documentation

## 2023-12-22 DIAGNOSIS — Z955 Presence of coronary angioplasty implant and graft: Secondary | ICD-10-CM | POA: Diagnosis not present

## 2023-12-22 DIAGNOSIS — I5042 Chronic combined systolic (congestive) and diastolic (congestive) heart failure: Secondary | ICD-10-CM | POA: Diagnosis not present

## 2023-12-22 DIAGNOSIS — I130 Hypertensive heart and chronic kidney disease with heart failure and stage 1 through stage 4 chronic kidney disease, or unspecified chronic kidney disease: Secondary | ICD-10-CM | POA: Insufficient documentation

## 2023-12-22 DIAGNOSIS — L97522 Non-pressure chronic ulcer of other part of left foot with fat layer exposed: Secondary | ICD-10-CM | POA: Insufficient documentation

## 2023-12-22 DIAGNOSIS — E1122 Type 2 diabetes mellitus with diabetic chronic kidney disease: Secondary | ICD-10-CM | POA: Insufficient documentation

## 2023-12-22 DIAGNOSIS — E785 Hyperlipidemia, unspecified: Secondary | ICD-10-CM | POA: Insufficient documentation

## 2023-12-22 DIAGNOSIS — Z794 Long term (current) use of insulin: Secondary | ICD-10-CM | POA: Insufficient documentation

## 2023-12-22 DIAGNOSIS — E1140 Type 2 diabetes mellitus with diabetic neuropathy, unspecified: Secondary | ICD-10-CM | POA: Diagnosis not present

## 2023-12-22 DIAGNOSIS — E11621 Type 2 diabetes mellitus with foot ulcer: Secondary | ICD-10-CM | POA: Insufficient documentation

## 2023-12-22 DIAGNOSIS — L97528 Non-pressure chronic ulcer of other part of left foot with other specified severity: Secondary | ICD-10-CM | POA: Diagnosis not present

## 2023-12-22 DIAGNOSIS — Z89422 Acquired absence of other left toe(s): Secondary | ICD-10-CM | POA: Diagnosis not present

## 2023-12-22 DIAGNOSIS — L84 Corns and callosities: Secondary | ICD-10-CM | POA: Insufficient documentation

## 2023-12-22 DIAGNOSIS — N189 Chronic kidney disease, unspecified: Secondary | ICD-10-CM | POA: Diagnosis not present

## 2023-12-23 ENCOUNTER — Other Ambulatory Visit: Payer: Self-pay

## 2023-12-23 ENCOUNTER — Other Ambulatory Visit (HOSPITAL_COMMUNITY): Payer: Self-pay

## 2023-12-23 NOTE — Progress Notes (Signed)
MARNE, AUSLEY ([id number]) [id number] Page 1 of 7 Visit Report for 12/22/2023 Arrival Information Details Patient Name: Date of Service: [name] [place], [name] RO LD 12/22/2023 2:45 PM Medical Record Number: [id number] Patient Account Number: [id number] Date of Birth/Sex: Treating RN: May 30, 1971 (52 y.o. M) Primary Care Loray Akard: [name] Other Clinician: Referring Nasiir Monts: Treating Jaine Estabrooks/Extender: [name] in Treatment: 52 Visit Information History Since Last Visit Added or deleted any medications: No Patient Arrived: Ambulatory Any new allergies or adverse reactions: No Arrival Time: 14:37 Had a fall or experienced change in No Accompanied By: self activities of daily living that may affect Transfer Assistance: None risk of falls: Patient Identification Verified: Yes Signs or symptoms of abuse/neglect since last visito No Secondary Verification Process Completed: Yes Hospitalized since last visit: No Patient Requires Transmission-Based Precautions: No Implantable device outside of the clinic excluding No Patient Has Alerts: Yes cellular tissue based products placed in the center Patient Alerts: 08/2021 ABI:1.2 TBI 0.79 since last visit: Pain Present Now: No Electronic Signature(s) Signed: 12/22/2023 4:24:46 PM By: Dayton Scrape Entered By: Dayton Scrape on 12/22/2023 14:37:48 -------------------------------------------------------------------------------- Encounter Discharge Information Details Patient Name: Date of Service: [name], [name] 12/22/2023 2:45 PM Medical Record Number: [id number] Patient Account Number: [id number] Date of Birth/Sex: Treating RN: 08-Jun-1971 (52 y.o. [name] Primary Care Eyvette Cordon: [name] Other Clinician: Referring Danna Sewell: Treating Matheo Rathbone/Extender: [name] in Treatment: 32 Encounter Discharge Information Items Post Procedure  Vitals Discharge Condition: Stable Temperature (F): 97.6 Ambulatory Status: Ambulatory Pulse (bpm): 90 Discharge Destination: Home Respiratory Rate (breaths/min): 18 Transportation: Private Auto Blood Pressure (mmHg): 129/86 Accompanied By: self Schedule Follow-up Appointment: Yes Clinical Summary of Care: Patient Declined Electronic Signature(s) Signed: 12/22/2023 4:11:10 PM By: Redmond Pulling RN, BSN Entered By: Redmond Pulling on 12/22/2023 15:08:52 [name] ([id number]) [id number] Page 2 of 7 -------------------------------------------------------------------------------- Lower Extremity Assessment Details Patient Name: Date of Service: [name] [place], [name] RO LD 12/22/2023 2:45 PM Medical Record Number: [id number] Patient Account Number: [id number] Date of Birth/Sex: Treating RN: Jan 22, 1971 (52 y.o. [name] Primary Care Tanesia Butner: [name] Other Clinician: Referring Crislyn Willbanks: Treating Jakita Dutkiewicz/Extender: [name] in Treatment: 47 Edema Assessment Assessed: [name]: No] [name]: No] Edema: [Left: N] [Right: o] Calf Left: Right: Point of Measurement: 43 cm From Medial Instep 37 cm Ankle Left: Right: Point of Measurement: 9 cm From Medial Instep 21.3 cm Vascular Assessment Pulses: Dorsalis Pedis Palpable: [Left:Yes] Extremity colors, hair growth, and conditions: Extremity Color: [Left:Normal] Hair Growth on Extremity: [Left:Yes] Temperature of Extremity: [Left:Warm] Capillary Refill: [Left:< 3 seconds] Dependent Rubor: [Left:No No] Electronic Signature(s) Signed: 12/22/2023 4:11:10 PM By: Redmond Pulling RN, BSN Entered By: Redmond Pulling on 12/22/2023 14:42:47 -------------------------------------------------------------------------------- Multi Wound Chart Details Patient Name: Date of Service: [name], [name] 12/22/2023 2:45 PM Medical Record Number: [id number] Patient Account Number:  [id number] Date of Birth/Sex: Treating RN: [birth year] (52 y.o. M) Primary Care [name]: [name] Other Clinician: Referring Mannat Benedetti: Treating Alisyn Lequire/Extender: [name] in Treatment: 78 Vital Signs Height(in): 78 Pulse(bpm): 90 Weight(lbs): 275 Blood Pressure(mmHg): 129/86 Body Mass Index(BMI): 31.8 Temperature(F): 97.6 Respiratory Rate(breaths/min): 18 [4:Photos:] [N/A:N/A] Left Foot N/A N/A Wound Location: Pressure Injury N/A N/A Wounding Event: Diabetic Wound/Ulcer of the Lower N/A N/A Primary Etiology: Extremity Chronic sinus problems/congestion, N/A N/A Comorbid History: Congestive Heart Failure, Coronary Artery Disease, Hypertension, Myocardial Infarction, Type II Diabetes, Neuropathy 12/22/2023 N/A N/A Date Acquired: 0 N/A N/A Weeks of Treatment: Open  N/A N/A Wound Status: No N/A N/A Wound Recurrence: 0.5x0.5x0.1 N/A N/A Measurements L x W x D (cm) 0.196 N/A N/A A (cm) : rea 0.02 N/A N/A Volume (cm) : Grade 2 N/A N/A Classification: Medium N/A N/A Exudate A mount: Serosanguineous N/A N/A Exudate Type: red, brown N/A N/A Exudate Color: None Present (0%) N/A N/A Granulation A mount: Large (67-100%) N/A N/A Necrotic A mount: Eschar, Adherent Slough N/A N/A Necrotic Tissue: Fat Layer (Subcutaneous Tissue): Yes N/A N/A Exposed Structures: Fascia: No Tendon: No Muscle: No Joint: No Bone: No None N/A N/A Epithelialization: Debridement - Selective/Open Wound N/A N/A Debridement: Pre-procedure Verification/Time Out 14:45 N/A N/A Taken: Lidocaine 5% topical ointment N/A N/A Pain Control: Callus N/A N/A Tissue Debrided: Non-Viable Tissue N/A N/A Level: 0.2 N/A N/A Debridement A (sq cm): rea Curette N/A N/A Instrument: Minimum N/A N/A Bleeding: Pressure N/A N/A Hemostasis A chieved: Procedure was tolerated well N/A N/A Debridement Treatment Response: 0.5x0.5x0.1 N/A N/A Post Debridement  Measurements L x W x D (cm) 0.02 N/A N/A Post Debridement Volume: (cm) Callus: Yes N/A N/A Periwound Skin Texture: Debridement N/A N/A Procedures Performed: Treatment Notes Electronic Signature(s) Signed: 12/22/2023 4:28:38 PM By: [name] MD FACS Entered By: [name] on 12/22/2023 15:02:17 -------------------------------------------------------------------------------- Multi-Disciplinary Care Plan Details Patient Name: Date of Service: [name] 12/22/2023 2:45 PM Medical Record Number: [id number] Patient Account Number: [id number] Date of Birth/Sex: Treating RN: 12-11-1971 (52 y.o. [name] Primary Care Mamadou Breon: [name] Other Clinician: Referring Patricia Fargo: Treating Eylin Pontarelli/Extender: [name] in Treatment: [address] Avis, [name] ([id number]) [id number] Page 4 of 7 Pressure Nursing Diagnoses: Knowledge deficit related to causes and risk factors for pressure ulcer development Potential for impaired tissue integrity related to pressure, friction, moisture, and shear Goals: Patient/caregiver will verbalize understanding of pressure ulcer management Date Initiated: 12/22/2023 Target Resolution Date: 01/29/2024 Goal Status: Active Interventions: Assess offloading mechanisms upon admission and as needed Assess potential for pressure ulcer upon admission and as needed Provide education on pressure ulcers Notes: Electronic Signature(s) Signed: 12/22/2023 4:11:10 PM By: Redmond Pulling RN, BSN Entered By: Redmond Pulling on 12/22/2023 15:07:34 -------------------------------------------------------------------------------- Pain Assessment Details Patient Name: Date of Service: [name] 12/22/2023 2:45 PM Medical Record Number: [id number] Patient Account Number: [id number] Date of Birth/Sex: Treating RN: [birth year] (52 y.o. M) Primary Care Rana Hochstein: [name] Other Clinician: Referring Katiria Calame: Treating Emylia Latella/Extender: [name] in Treatment: 103 Active Problems Location of Pain Severity and Description of Pain Patient Has Paino No Site Locations Pain Management and Medication Current Pain Management: Electronic Signature(s) Signed: 12/22/2023 4:24:46 PM By: Dayton Scrape Entered By: Dayton Scrape on 12/22/2023 14:38:24 [name] ([id number]) [id number] Page 5 of 7 -------------------------------------------------------------------------------- Patient/Caregiver Education Details Patient Name: Date of Service: [name] [place], [name] RO LD 12/23/2024andnbsp2:45 PM Medical Record Number: [id number] Patient Account Number: [id number] Date of Birth/Gender: Treating RN: Oct 03, 1971 (52 y.o. [name] Primary Care Physician: [name] Other Clinician: Referring Physician: Treating Physician/Extender: [name] in Treatment: 57 Education Assessment Education Provided To: Patient Education Topics Provided Wound/Skin Impairment: Methods: Explain/Verbal Responses: State content correctly [employer]) Signed: 12/22/2023 4:11:10 PM By: Redmond Pulling RN, BSN Entered By: Redmond Pulling on 12/22/2023 15:07:47 -------------------------------------------------------------------------------- Wound Assessment Details Patient Name: Date of Service: [name], [name] 12/22/2023 2:45 PM Medical Record Number: [id number] Patient Account Number: [id number] Date of Birth/Sex: Treating RN: Jul 25, 1971 (52 y.o. [name] Primary  Care Maizy Davanzo: [name] Other Clinician: Referring Aniruddh Ciavarella: Treating Mariusz Jubb/Extender: [name] in Treatment: 47 Wound Status Wound Number: 4 Primary Diabetic Wound/Ulcer of the Lower Extremity Etiology: Wound Location: Left Foot Wound Open Wounding Event:  Pressure Injury Status: Date Acquired: 12/22/2023 Comorbid Chronic sinus problems/congestion, Congestive Heart Failure, Weeks Of Treatment: 0 History: Coronary Artery Disease, Hypertension, Myocardial Infarction, Type Clustered Wound: No II Diabetes, Neuropathy Photos Wound Measurements Length: (cm) 0.5 Width: (cm) 0.5 Reis, Minor ([id number]) Depth: (cm) 0.1 Area: (cm) 0.196 Volume: (cm) 0.02 % Reduction in Area: % Reduction in Volume: [id number] Page 6 of 7 Epithelialization: None Tunneling: No Undermining: No Wound Description Classification: Grade 2 Exudate Amount: Medium Exudate Type: Serosanguineous Exudate Color: red, brown Foul Odor After Cleansing: No Slough/Fibrino Yes Wound Bed Granulation Amount: None Present (0%) Exposed Structure Necrotic Amount: Large (67-100%) Fascia Exposed: No Necrotic Quality: Eschar, Adherent Slough Fat Layer (Subcutaneous Tissue) Exposed: Yes Tendon Exposed: No Muscle Exposed: No Joint Exposed: No Bone Exposed: No Periwound Skin Texture Texture Color No Abnormalities Noted: No No Abnormalities Noted: No Callus: Yes Moisture No Abnormalities Noted: No Treatment Notes Wound #4 (Foot) Wound Laterality: Left Cleanser Soap and Water Discharge Instruction: May shower and wash wound with dial antibacterial soap and water prior to dressing change. Peri-Wound Care Topical Primary Dressing Optifoam Non-Adhesive Dressing, 4x4 in Discharge Instruction: Apply to wound bed as instructed Secondary Dressing Woven Gauze Sponge, Non-Sterile 4x4 in Discharge Instruction: Apply over primary dressing as directed. Secured With Elastic Bandage 4 inch (ACE bandage) Discharge Instruction: Secure with ACE bandage as directed. 44M Medipore H Soft Cloth Surgical T ape, 4 x 10 (in/yd) Discharge Instruction: Secure with tape as directed. Compression Wrap Compression Stockings Add-Ons Electronic  Signature(s) Signed: 12/22/2023 4:11:10 PM By: Redmond Pulling RN, BSN Entered By: Redmond Pulling on 12/22/2023 14:46:03 -------------------------------------------------------------------------------- Vitals Details Patient Name: Date of Service: [name], [name] 12/22/2023 2:45 PM Medical Record Number: [id number] Patient Account Number: [id number] Date of Birth/Sex: Treating RN: [birth year] (52 y.o. Particia Nearing, [name] ([id number]) [id number] Page 7 of 7 Primary Care Seda Kronberg: [name] Other Clinician: Referring Laymond Postle: Treating Adrianna Dudas/Extender: [name] in Treatment: 47 Vital Signs Time Taken: 02:37 Temperature (F): 97.6 Height (in): 78 Pulse (bpm): 90 Weight (lbs): 275 Respiratory Rate (breaths/min): 18 Body Mass Index (BMI): 31.8 Blood Pressure (mmHg): 129/86 Reference Range: 80 - 120 mg / dl Electronic Signature(s) Signed: 12/22/2023 4:24:46 PM By: Dayton Scrape Entered By: Dayton Scrape on 12/22/2023 14:38:18

## 2023-12-23 NOTE — Progress Notes (Signed)
[name], [name] ([id number]) [id number] Page 1 of 10 Visit Report for 12/22/2023 Chief Complaint Document Details Patient Name: Date of Service: [name] [place], [name] RO LD 12/22/2023 2:45 PM Medical Record Number: [id number] Patient Account Number: [id number] Date of Birth/Sex: Treating RN: 1971/01/26 (52 y.o. M) Primary Care Provider: Shan [name] Other Clinician: Referring Provider: Treating Provider/Extender: [name] in Treatment: 40 Information Obtained from: Patient Chief Complaint 01/15/2023; left foot wound Electronic Signature(s) Signed: 12/22/2023 4:28:38 PM By: Glenn Guess MD FACS Entered By: [name] on 12/22/2023 15:02:56 -------------------------------------------------------------------------------- Debridement Details Patient Name: Date of Service: [name] [place], [name] 12/22/2023 2:45 PM Medical Record Number: [id number] Patient Account Number: [id number] Date of Birth/Sex: Treating RN: 05/18/1971 (52 y.o. M) Primary Care Provider: Shan [name] Other Clinician: Referring Provider: Treating Provider/Extender: [name] in Treatment: 47 Debridement Performed for Assessment: Wound #4 Left Foot Performed By: Physician Glenn Guess, MD The following information was scribed by: [name] The information was scribed for: [name] Debridement Type: Debridement Severity of Tissue Pre Debridement: Other severity specified Level of Consciousness (Pre-procedure): Awake and Alert Pre-procedure Verification/Time Out Yes - 14:45 Taken: Start Time: 14:45 Pain Control: Lidocaine 5% topical ointment Percent of Wound Bed Debrided: 100% T Area Debrided (cm): otal 0.2 Tissue and other material debrided: Non-Viable, Callus Level: Non-Viable Tissue Debridement Description: Selective/Open Wound Instrument: Curette Bleeding: Minimum Hemostasis Achieved:  Pressure Response to Treatment: Procedure was tolerated well Level of Consciousness (Post- Awake and Alert procedure): Post Debridement Measurements of Total Wound Length: (cm) 0.5 Width: (cm) 0.5 Depth: (cm) 0.1 Volume: (cm) 0.02 Character of Wound/Ulcer Post Debridement: Improved Severity of Tissue Post Debridement: Other severity specified [name], [name] ([id number]) [phone].pdf Page 2 of 10 Post Procedure Diagnosis Same as Pre-procedure Electronic Signature(s) Signed: 12/22/2023 4:28:38 PM By: Glenn Guess MD FACS Entered By: [name] on 12/22/2023 15:02:50 -------------------------------------------------------------------------------- HPI Details Patient Name: Date of Service: [name] IN, [name] 12/22/2023 2:45 PM Medical Record Number: [id number] Patient Account Number: [id number] Date of Birth/Sex: Treating RN: [birth year] (52 y.o. M) Primary Care Provider: Shan [name] Other Clinician: Referring Provider: Treating Provider/Extender: [name] in Treatment: 40 History of Present Illness HPI Description: ADMISSION 01/11/2020 This is a 52 year old man with uncontrolled type 2 diabetes with a recent hemoglobin A1c earlier this year of 13.4. He is on insulin and glipizide. He does not take his blood sugars at home. He does have a follow-up with primary care later this month I believe on January 27. He tells [place] that roughly a month ago he was walking with a shoe with a hole in his foot. He took the shoe off and there was an open wound at roughly the left fourth met head. This has significant undermining and raised edges. He has not noticed any purulence he does not feel unwell. More recently he was taking skin off the bottom of his foot and has a superficial area on the left fifth met head. He has not been offloading this. The patient was in the ER on 12/20. They gave him Bactroban which she has been using  on the wound and 10 days worth of doxycycline. No x-rays were done. He has not had vascular studies. He is also been using hydrogen peroxide. Past medical history type 2 diabetes uncontrolled, chronic systolic heart failure, coronary artery disease with a history of congestive heart failure with stents. Hypertension hyperlipidemia and chronic renal insufficiency ABI in this clinic was 1.14 on  the left. 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 He is uncertain whether he would be able to work if we put him on some form of restriction 1/19; he is generally doing quite well. Using silver alginate on the wounds. Things actually look better. He has a forefoot offloading boot which she seems to be compliant about. He has support at work to stay off his foot is much as possible which is gratifying. Culture I did last week showed a few Enterococcus faecalis. I am going to put him on Augmentin. I talked about ordering an x-ray in my note last week but that does not seem to have happened. We will review reorder the x-ray this week. 1/26; x-ray reordered last week was negative for osteomyelitis. We are using silver alginate on the wound on the third and fifth met heads on the left. He is using a Darco forefoot off loader 2/2; the area on the fifth met head is closed. Third met head is still open with tunneling depth and thick callus. 2/9; the area on the fifth met head remains closed however the third met head again has a small open area on presentation with tunneling in depth and surrounded by thick callus. This looks like a pressure issue. We have been using silver alginate 2/16; the area of the fifth metatarsal head remains closed however the area over the third metatarsal head again is a small open area but with some depth. I do not think this is changed much since last week. He is using Hydrofera Blue with forefoot off loader. He is not able to use a total contact cast on the  left leg because he needs his left leg at work [employer]]. Fortunately the wound does not look infected. I changed him to endoform today 2/26; the area of the fifth metatarsal head remains closed. The area of the third metatarsal head has an even smaller opening this time. I used endoform on this this looks improved. He is offloading this is much as he can and a forefoot off loader on the left. He cannot have a total contact cast because of work responsibilities 3/5; the area on the fifth metatarsal head remains closed the area on the third metatarsal head is also closed on the left foot. 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 He has been following for podiatry for several months for this issue. He is currently using wet-to-dry saline dressings. He currently denies signs of infection. [place] note from 1/17; Patient presents with a 70-month history of nonhealing ulcer to the left foot secondary to diabetes and inability to offload well the area. He has had multiple debridements in the past to his feet bilaterally. He has had resection of the fourth left met head in the fifth toe Secondary to osteomyelitis. We discussed the importance of glycemic control for wound healing. Due to his blood glucose levels being elevated he is at high risk for infection and thus further amputation. He expressed understanding. He states he is supposed to be referred to an endocrinologist at [facility] however the referral fell through. Unclear what happened. Offered a referral to endocrinology at [facility].  Patient was agreeable.Furthermore we discussed the  importance of aggressive offloading for his wound healing. This will be the most difficult part of the treatment plan for the patient to do. We discussed a total contact cast however he has declined that at this time. 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 He is currently using a surgical shoe with a peg assist. It does not fit well so we will give him a new one today. I do not think this is enough offloading. If he is not able to offload this area he will likely end up with a BKA. He is well aware of this. For now I recommended Medihoney and Hydrofera Blue for dressing changes. He will follow-up in our [place] office Since this is closer for him. 1/26; patient presents for follow-up. Last clinic visit I had seen him in [place] and we transferred him to [place] since this is a closer location for him. Progress note above from that visit. Over the past week He has been using Medihoney and Hydrofera Blue to the wound bed. He has been using his surgical [name], [name] ([id number]) [phone].pdf Page 3 of 10 shoe with peg assist. He has no issues or complaints today. We discussed doing the total contact cast and he was agreeable to having this placed at next clinic visit. 2/2; patient presents for follow-up. He has been using Vashe wet-to-dry dressings. Plan is for the total contact cast today. 2/5; patient presents for follow-up. The cast was placed 3 days ago. He tolerated this well however had a lot of drainage. He currently denies signs of infection. 2/9; patient presents for follow-up. At last clinic visit we held off on replacing the cast due to drainage. He has been using Vashe wet-to-dry dressings and he is currently taking the oral antibiotics prescribed without issues. He declines a total contact cast today. We ran insurance verification for skin substitute and due to cost patient  declines having this placed. 2/16; patient presents for follow-up. He has decided not to follow-up with podiatry. He continues to decline the total contact cast. He has been using Vashe wet- to-dry dressings. He has not been wearing his surgical shoe with peg assist. He currently denies signs of infection. 3/1; patient presents for follow-up. He has been doing Vashe wet-to-dry dressings. He has a surgical shoe with peg assist. We discussed potentially doing a skin substitute for which she has been approved for by insurance. He knows the out-of-pocket cost of this and would still like to proceed with having this placed today. He denies the current total contact cast. 3/8; patient presents for follow-up. He states that the skin substitute came off after a few days and he has been using Vashe wet-to-dry dressings. He has mild odor to the wound bed on exam. 3/15; patient presents for follow-up. He has been using Vashe wet-to-dry dressings and started his course of Augmentin. He reports improvement in wound healing. He has no issues or complaints today. 3/21; patient presents for follow-up. He continues to use Vashe wet-to-dry dressings. He has no issues or complaints today. 3/28; patient presents for follow-up. He has been using Vashe wet-to-dry dressings. He has no issues or complaints today. He has been using his peg assist with surgical shoe. 4/11; patient presents for follow-up. He has been using Vashe wet-to-dry dressings. The periwound is macerated. He has been using his peg assist with  surgical shoe. He has no issues or complaints today. 4/25; patient presents for follow-up. Has been using Vashe wet-to-dry dressings. He has been using zinc oxide to the periwound. For offloading he has been using his peg assist in surgical shoe. He has no issues or complaints today. 5/9; patient presents for follow-up. He has been using Vashe wet-to-dry dressings. He has no issues or complaints today. He has a  surgical shoe with peg assist for offloading. 5/23; patient presents for follow up. He has been using Vashe to clean the wound bed and collagen and blast X with dressing changes. [place] the beach [place] 2 [place]. Wound is bigger. 5/31; patient presents for follow-up. He has been using Vashe wet-to-dry dressings. Wound is stable but with healthier granulation tissue. Patient would like to hold off on the total contact cast and try a knee scooter for offloading. 6/13; patient presents for follow-up. He has been using Vashe wet-to-dry dressings. He has been using a knee scooter for offloading however states he felt unstable on this and decided to buy an electric scooter. This arrived in the mail today. He would like to hold off on the left total contact cast. Wound is slightly smaller today. 6/27; patient presents for follow-up. He has been using Vashe wet-to-dry dressings. He is using an Art gallery manager for mobility to help relieve the pressure off the wound bed. Wound is smaller today. He wants to hold off on doing the total contact cast as he is showing signs of improvement with wound healing. 7/11; patient presents for follow-up. He has been using Vashe wet-to-dry dressings and his electric scooter for offloading. Wound is slightly smaller. 7/25; patient presents for follow up. He has been using Vashe wet-to-dry dressings and he reports using his electric scooter for offloading. Wound is larger. 7/30; patient presents for follow-up. He has been using Vashe wet-to-dry dressings. He has taken Augmentin as prescribed without issues. Overall wound has healthier granulation tissue. 8/1; patient presents for follow-up. He presents for obligatory cast change. He tolerated the cast well and has no issues or complaints today. We have been using antibiotic ointment with Hydrofera Blue under the cast. Wound is slightly smaller. 8/8; patient presents for follow-up. We have been using antibiotic ointment with  Hydrofera Blue under the total contact cast. Wound is smaller. He has no issues or complaints today. 8/13; patient presents for follow-up. We have been using antibiotic ointment with Hydrofera Blue under the total contact cast. He comes in today because the top part of the cast over the foot has sunken as well as on the side. He wanted to assure that there were no issues. 8/22; patient presents for follow-up. He has been using Hydrofera Blue to the wound bed. Previously we have been using a total contact cast. We do not have total contact casts due to manufacturing issues available for patients. He has no issues or complaints. He denies signs of infection. 8/27; patient presents for follow-up. He has been using Vashe wet-to-dry dressings. T contact cast is available for placement today. He was agreeable to otal proceed with this. He denies signs of infection. 9/3; patient presents for follow-up. We have been using Hydrofera Blue with antibiotic ointment under the total contact cast for the past week. He has tolerated this well. The wound is smaller. Unfortunately we do not have a total contact cast in office today for replacement as there is been manufacturing issues. 9/5; patient was seen earlier this week and plan was for  a total contact cast but there is none available. The cast finally arrived in patient has come in today to have this placed. We will continue with Hydrofera Blue and antibiotic ointment under the cast. 9/12; patient presents for follow-up. We have been using Hydrofera Blue and antibiotic ointment under the cast. Wound is stable. He denies signs of infection. 09/17/2023: The wound is measuring smaller today. The surface appears healthy and there is minimal accumulation of slough. 09/23/2023: The wound external measurements remain about the same, but it is shallower and visually appears smaller. 09/30/2023: The wound is covered with a layer of callus. Underneath the callus, there is a  tiny superficial residual opening. 10/14/2023: He returns after being away for 2 weeks, during which he was out of a cast. Not surprisingly, his wound has reopened substantially. It remains clean, however. 10/21/2023: We have recovered most of what was lost as far as progress with his absence from clinic. The wound did measure smaller today. It is a little bit [name], [name] ([id number]) [id number] Page 4 of 10 macerated around the edges and he reports that he thinks some water got in over the top of his cast while showering. 10/28/2023: The wound measures smaller today. There is no tissue maceration. There is some slough on the surface and a little bit of callus accumulation around the edges. 11/04/2023: The wound measured a little bit deeper today and had some undermining, but this seems to be due to mainly to accumulated callus. The surface has a bit of slough. He has been approved for Grafix so we will plan to apply that today. 11/10/2023: He has responded beautifully to Grafix application. His wound is down to just a couple of millimeters. There is some slough on the surface. 11/18/2023: His wound is freshly healed. 11/25/2023: After an additional week in the cast, the skin of the healed surface has greater strength and integrity. 12/22/2023: The patient contacted the clinic due to concern that his wound had reopened. He noticed some darkening underneath the callus that had built up and then then a portion of the callus fell off. There was still a dark central area, concerning for wound reopening. Electronic Signature(s) Signed: 12/22/2023 4:28:38 PM By: Glenn Guess MD FACS Entered By: [name] on 12/22/2023 15:04:13 -------------------------------------------------------------------------------- Physical Exam Details Patient Name: Date of Service: [name] IN, [name] 12/22/2023 2:45 PM Medical Record Number: [id number] Patient Account Number:  [id number] Date of Birth/Sex: Treating RN: 11/26/1971 (52 y.o. M) Primary Care Provider: Shan [name] Other Clinician: Referring Provider: Treating Provider/Extender: [name] in Treatment: 47 Constitutional . . . . no acute distress. Respiratory Normal work of breathing on room air.. Notes 12/22/2023: There is callus with central discoloration present. Upon debridement, however, there is no opening in the skin at this time. It appears that he may have had a small crack that has since epithelialized. There is some concern for pressure-induced deep tissue damage on the lateral aspect of his foot, however. Electronic Signature(s) Signed: 12/22/2023 4:28:38 PM By: Glenn Guess MD FACS Entered By: [name] on 12/22/2023 15:12:27 -------------------------------------------------------------------------------- Physician Orders Details Patient Name: Date of Service: [name] IN, [name] 12/22/2023 2:45 PM Medical Record Number: [id number] Patient Account Number: [id number] Date of Birth/Sex: Treating RN: Feb 27, 1971 (52 y.o. 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 - Dr Lady Gary 01/05/23 @ 1045 Return A Other: - purchase 40% Urea cream from [employer] May shower and wash wound with soap and water. Off-Loading Wound #4 Left Foot Other: - keep callus area padded and inspect daily,  moisturize with urea cream to callus daily Wound Treatment Wound #4 - Foot Wound Laterality: Left Cleanser: Soap and Water Discharge Instructions: May shower and wash wound with dial antibacterial soap and water prior to dressing change. Prim Dressing: Optifoam Non-Adhesive Dressing, 4x4 in ary Discharge Instructions: Apply to wound bed as instructed Secondary Dressing: Woven Gauze Sponge, Non-Sterile 4x4 in Discharge Instructions: Apply over primary dressing as directed. Secured With: Elastic Bandage 4 inch (ACE bandage) Discharge Instructions: Secure with ACE bandage as directed. Secured With: 38M Medipore H Soft Cloth Surgical T ape, 4 x 10 (in/yd) Discharge Instructions: Secure with tape as directed. Electronic Signature(s) Signed: 12/22/2023 4:28:38 PM By: Glenn Guess MD FACS Entered By: [name] on 12/22/2023 15:17:43 -------------------------------------------------------------------------------- Problem List Details Patient Name: Date of Service: [name] IN, [name] 12/22/2023 2:45 PM Medical Record Number: [id number] Patient Account Number: [id number] Date of Birth/Sex: Treating RN: 1971/07/07 (52 y.o. M) Primary Care Provider: Shan [name] Other Clinician: Referring Provider: Treating Provider/Extender: [name] in Treatment: 29 Active Problems ICD-10 Encounter Code Description Active Date MDM Diagnosis L84 Corns and callosities 12/22/2023 No Yes L97.522 Non-pressure chronic ulcer of other part of left foot with fat layer exposed 01/24/2023 No Yes E11.621 Type 2 diabetes mellitus with foot ulcer 01/24/2023 No Yes Z89.422 Acquired absence of other left toe(s) 01/24/2023 No Yes [name], [name] ([id number]) [phone].pdf Page 6 of 10 Inactive Problems Resolved Problems Electronic Signature(s) Signed: 12/22/2023 4:28:38 PM By: Glenn Guess MD FACS Entered By: [name] on 12/22/2023  15:02:05 -------------------------------------------------------------------------------- Progress Note Details Patient Name: Date of Service: [name] IN, [name] 12/22/2023 2:45 PM Medical Record Number: [id number] Patient Account Number: [id number] Date of Birth/Sex: Treating RN: [birth year] (52 y.o. M) Primary Care Provider: Shan [name] Other Clinician: Referring Provider: Treating Provider/Extender: [name] in Treatment: 66 Subjective Chief Complaint Information obtained from Patient 01/15/2023; left foot wound History of Present Illness (HPI) ADMISSION 01/11/2020 This is a 52 year old man with uncontrolled type 2 diabetes with a recent hemoglobin A1c earlier this year of 13.4. He is on insulin and glipizide. He does not take his blood sugars at home. He does have a follow-up with primary care later this month I believe on January 27. He tells [place] that roughly a month ago he was walking with a shoe with a hole in his foot. He took the shoe off and there was an open wound at roughly the left fourth met head. This has significant undermining and raised edges. He has not noticed any purulence he does not feel unwell. More recently he was taking skin off the bottom of his foot and has a superficial area on the left fifth met head. He has not been offloading this. The patient was in the ER on 12/20. They gave him Bactroban which she has been using on the wound and 10 days worth of doxycycline. No x-rays were done. He has not had vascular studies. He is also  been using hydrogen peroxide. Past medical history type 2 diabetes uncontrolled, chronic systolic heart failure, coronary artery disease with a history of congestive heart failure with stents. Hypertension hyperlipidemia and chronic renal insufficiency ABI in this clinic was 1.14 on the left. 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 He is uncertain whether he would be able to  work if we put him on some form of restriction 1/19; he is generally doing quite well. Using silver alginate on the wounds. Things actually look better. He has a forefoot offloading boot which she seems to be compliant about. He has support at work to stay off his foot is much as possible which is gratifying. Culture I did last week showed a few Enterococcus faecalis. I am going to put him on Augmentin. I talked about ordering an x-ray in my note last week but that does not seem to have happened. We will review reorder the x-ray this week. 1/26; x-ray reordered last week was negative for osteomyelitis. We are using silver alginate on the wound on the third and fifth met heads on the left. He is using a Darco forefoot off loader 2/2; the area on the fifth met head is closed. Third met head is still open with tunneling depth and thick callus. 2/9; the area on the fifth met head remains closed however the third met head again has a small open area on presentation with tunneling in depth and surrounded by thick callus. This looks like a pressure issue. We have been using silver alginate 2/16; the area of the fifth metatarsal head remains closed however the area over the third metatarsal head again is a small open area but with some depth. I do not think this is changed much since last week. He is using Hydrofera Blue with forefoot off loader. He is not able to use a total contact cast on the left leg because he needs his left leg at work [employer]]. Fortunately the wound does not look infected. I changed him to endoform today 2/26; the area of the fifth metatarsal head remains closed. The area of the third metatarsal head has an even smaller opening this time. I used endoform on this this looks improved. He is offloading this is much as he can and a forefoot off loader on the left. He cannot have a total contact cast because of work responsibilities 3/5; the area on the fifth metatarsal head  remains closed the area on the third metatarsal head is also closed on the left foot. 01/15/2023 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 He has been following for podiatry for several months for this issue. He is currently using wet-to-dry saline dressings. He currently denies signs of infection. [place] note from 1/17; Patient presents with a 90-month history of nonhealing ulcer to the left foot secondary to diabetes and inability to offload well the area. He has had multiple debridements in the past to his feet bilaterally. He has had resection of the fourth left met head in the fifth toe Secondary to osteomyelitis. We discussed the importance of glycemic control for wound healing. Due to his blood glucose levels being elevated he  is at high risk for infection and thus further amputation. He expressed understanding. He states he is supposed to be referred to an endocrinologist at [facility] however the referral fell through. Unclear what happened. Offered a referral to endocrinology at [facility]. Patient was agreeable.Furthermore we discussed the importance of [name], [name] ([id number]) [phone].pdf Page 7 of 10 aggressive offloading for his wound healing. This will be the most difficult part of the treatment plan for the patient to do. We discussed a total contact cast however he has declined that at this time. 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 He is currently using a surgical shoe with a peg assist. It does not fit well so we will give him a new  one today. I do not think this is enough offloading. If he is not able to offload this area he will likely end up with a BKA. He is well aware of this. For now I recommended Medihoney and Hydrofera Blue for dressing changes. He will follow-up in our [place] office Since this is closer for him. 1/26; patient presents for follow-up. Last clinic visit I had seen him in [place] and we transferred him to [place] since this is a closer location for him. Progress note above from that visit. Over the past week He has been using Medihoney and Hydrofera Blue to the wound bed. He has been using his surgical shoe with peg assist. He has no issues or complaints today. We discussed doing the total contact cast and he was agreeable to having this placed at next clinic visit. 2/2; patient presents for follow-up. He has been using Vashe wet-to-dry dressings. Plan is for the total contact cast today. 2/5; patient presents for follow-up. The cast was placed 3 days ago. He tolerated this well however had a lot of drainage. He currently denies signs of infection. 2/9; patient presents for follow-up. At last clinic visit we held off on replacing the cast due to drainage. He has been using Vashe wet-to-dry dressings and he is currently taking the oral antibiotics prescribed without issues. He declines a total contact cast today. We ran insurance verification for skin substitute and due to cost patient declines having this placed. 2/16; patient presents for follow-up. He has decided not to follow-up with podiatry. He continues to decline the total contact cast. He has been using Vashe wet- to-dry dressings. He has not been wearing his surgical shoe with peg assist. He currently denies signs of infection. 3/1; patient presents for follow-up. He has been doing Vashe wet-to-dry dressings. He has a surgical shoe with peg assist. We discussed potentially doing a skin substitute for which she has been approved for by  insurance. He knows the out-of-pocket cost of this and would still like to proceed with having this placed today. He denies the current total contact cast. 3/8; patient presents for follow-up. He states that the skin substitute came off after a few days and he has been using Vashe wet-to-dry dressings. He has mild odor to the wound bed on exam. 3/15; patient presents for follow-up. He has been using Vashe wet-to-dry dressings and started his course of Augmentin. He reports improvement in wound healing. He has no issues or complaints today. 3/21; patient presents for follow-up. He continues to use Vashe wet-to-dry dressings. He has no issues or complaints today. 3/28; patient presents for follow-up. He has been using Vashe wet-to-dry dressings.  He has no issues or complaints today. He has been using his peg assist with surgical shoe. 4/11; patient presents for follow-up. He has been using Vashe wet-to-dry dressings. The periwound is macerated. He has been using his peg assist with surgical shoe. He has no issues or complaints today. 4/25; patient presents for follow-up. Has been using Vashe wet-to-dry dressings. He has been using zinc oxide to the periwound. For offloading he has been using his peg assist in surgical shoe. He has no issues or complaints today. 5/9; patient presents for follow-up. He has been using Vashe wet-to-dry dressings. He has no issues or complaints today. He has a surgical shoe with peg assist for offloading. 5/23; patient presents for follow up. He has been using Vashe to clean the wound bed and collagen and blast X with dressing changes. [place] went to the beach [place] 2 [place]. Wound is bigger. 5/31; patient presents for follow-up. He has been using Vashe wet-to-dry dressings. Wound is stable but with healthier granulation tissue. Patient would like to hold off on the total contact cast and try a knee scooter for offloading. 6/13; patient presents for follow-up. He has been using  Vashe wet-to-dry dressings. He has been using a knee scooter for offloading however states he felt unstable on this and decided to buy an electric scooter. This arrived in the mail today. He would like to hold off on the left total contact cast. Wound is slightly smaller today. 6/27; patient presents for follow-up. He has been using Vashe wet-to-dry dressings. He is using an Art gallery manager for mobility to help relieve the pressure off the wound bed. Wound is smaller today. He wants to hold off on doing the total contact cast as he is showing signs of improvement with wound healing. 7/11; patient presents for follow-up. He has been using Vashe wet-to-dry dressings and his electric scooter for offloading. Wound is slightly smaller. 7/25; patient presents for follow up. He has been using Vashe wet-to-dry dressings and he reports using his electric scooter for offloading. Wound is larger. 7/30; patient presents for follow-up. He has been using Vashe wet-to-dry dressings. He has taken Augmentin as prescribed without issues. Overall wound has healthier granulation tissue. 8/1; patient presents for follow-up. He presents for obligatory cast change. He tolerated the cast well and has no issues or complaints today. We have been using antibiotic ointment with Hydrofera Blue under the cast. Wound is slightly smaller. 8/8; patient presents for follow-up. We have been using antibiotic ointment with Hydrofera Blue under the total contact cast. Wound is smaller. He has no issues or complaints today. 8/13; patient presents for follow-up. We have been using antibiotic ointment with Hydrofera Blue under the total contact cast. He comes in today because the top part of the cast over the foot has sunken as well as on the side. He wanted to assure that there were no issues. 8/22; patient presents for follow-up. He has been using Hydrofera Blue to the wound bed. Previously we have been using a total contact cast. We do  not have total contact casts due to manufacturing issues available for patients. He has no issues or complaints. He denies signs of infection. 8/27; patient presents for follow-up. He has been using Vashe wet-to-dry dressings. T contact cast is available for placement today. He was agreeable to otal proceed with this. He denies signs of infection. 9/3; patient presents for follow-up. We have been using Hydrofera Blue with antibiotic ointment under the total contact cast for the past  week. He has tolerated this well. The wound is smaller. Unfortunately we do not have a total contact cast in office today for replacement as there is been manufacturing issues. 9/5; patient was seen earlier this week and plan was for a total contact cast but there is none available. The cast finally arrived in patient has come in today to have this placed. We will continue with Hydrofera Blue and antibiotic ointment under the cast. 9/12; patient presents for follow-up. We have been using Hydrofera Blue and antibiotic ointment under the cast. Wound is stable. He denies signs of infection. 09/17/2023: The wound is measuring smaller today. The surface appears healthy and there is minimal accumulation of slough. [name], [name] ([id number]) [id number] Page 8 of 10 09/23/2023: The wound external measurements remain about the same, but it is shallower and visually appears smaller. 09/30/2023: The wound is covered with a layer of callus. Underneath the callus, there is a tiny superficial residual opening. 10/14/2023: He returns after being away for 2 weeks, during which he was out of a cast. Not surprisingly, his wound has reopened substantially. It remains clean, however. 10/21/2023: We have recovered most of what was lost as far as progress with his absence from clinic. The wound did measure smaller today. It is a little bit macerated around the edges and he reports that he thinks some water got in  over the top of his cast while showering. 10/28/2023: The wound measures smaller today. There is no tissue maceration. There is some slough on the surface and a little bit of callus accumulation around the edges. 11/04/2023: The wound measured a little bit deeper today and had some undermining, but this seems to be due to mainly to accumulated callus. The surface has a bit of slough. He has been approved for Grafix so we will plan to apply that today. 11/10/2023: He has responded beautifully to Grafix application. His wound is down to just a couple of millimeters. There is some slough on the surface. 11/18/2023: His wound is freshly healed. 11/25/2023: After an additional week in the cast, the skin of the healed surface has greater strength and integrity. 12/22/2023: The patient contacted the clinic due to concern that his wound had reopened. He noticed some darkening underneath the callus that had built up and then then a portion of the callus fell off. There was still a dark central area, concerning for wound reopening. Objective Constitutional no acute distress. Vitals Time Taken: 2:37 AM, Height: 78 in, Weight: 275 lbs, BMI: 31.8, Temperature: 97.6 F, Pulse: 90 bpm, Respiratory Rate: 18 breaths/min, Blood Pressure: 129/86 mmHg. Respiratory Normal work of breathing on room air.. General Notes: 12/22/2023: There is callus with central discoloration present. Upon debridement, however, there is no opening in the skin at this time. It appears that he may have had a small crack that has since epithelialized. There is some concern for pressure-induced deep tissue damage on the lateral aspect of his foot, however. Integumentary (Hair, Skin) Wound #4 status is Open. Original cause of wound was Pressure Injury. The date acquired was: 12/22/2023. The wound is located on the Left Foot. The wound measures 0.5cm length x 0.5cm width x 0.1cm depth; 0.196cm^2 area and 0.02cm^3 volume. There is Fat Layer  (Subcutaneous Tissue) exposed. There is no tunneling or undermining noted. There is a medium amount of serosanguineous drainage noted. There is no granulation within the wound bed. There is a large (67-100%) amount of necrotic tissue within the wound bed including Eschar and Adherent  Slough. The periwound skin appearance exhibited: Callus. Assessment Active Problems ICD-10 Corns and callosities Non-pressure chronic ulcer of other part of left foot with fat layer exposed Type 2 diabetes mellitus with foot ulcer Acquired absence of other left toe(s) Procedures Wound #4 Pre-procedure diagnosis of Wound #4 is a Diabetic Wound/Ulcer of the Lower Extremity located on the Left Foot .Severity of Tissue Pre Debridement is: Other severity specified. There was a Selective/Open Wound Non-Viable Tissue Debridement with a total area of 0.2 sq cm performed by Glenn Guess, MD. With the following instrument(s): Curette to remove Non-Viable tissue/material. Material removed includes Callus after achieving pain control using Lidocaine 5% topical ointment. No specimens were taken. A time out was conducted at 14:45, prior to the start of the procedure. A Minimum amount of bleeding was controlled with Pressure. The procedure was tolerated well. Post Debridement Measurements: 0.5cm length x 0.5cm width x 0.1cm depth; 0.02cm^3 volume. Character of Wound/Ulcer Post Debridement is improved. Severity of Tissue Post Debridement is: Other severity specified. Post procedure Diagnosis Wound #4: Same as Pre-Procedure AVRUMY, CAMILLI ([id number]) [phone].pdf Page 9 of 10 Plan Follow-up Appointments: Return Appointment in 2 weeks. - Dr Lady Gary 01/05/23 @ 1045 Other: - purchase 40% Urea cream from [employer] Hygiene: May shower and wash wound with soap and water. Off-Loading: Wound #4 Left Foot: Other: - keep callus area padded and inspect daily, moisturize with urea cream to  callus daily WOUND #4: - Foot Wound Laterality: Left Cleanser: Soap and Water Discharge Instructions: May shower and wash wound with dial antibacterial soap and water prior to dressing change. Prim Dressing: Optifoam Non-Adhesive Dressing, 4x4 in ary Discharge Instructions: Apply to wound bed as instructed Secondary Dressing: Woven Gauze Sponge, Non-Sterile 4x4 in Discharge Instructions: Apply over primary dressing as directed. Secured With: Elastic Bandage 4 inch (ACE bandage) Discharge Instructions: Secure with ACE bandage as directed. Secured With: 13M Medipore H Soft Cloth Surgical T ape, 4 x 10 (in/yd) Discharge Instructions: Secure with tape as directed. 12/22/2023: There is callus with central discoloration present. Upon debridement, however, there is no opening in the skin at this time. It appears that he may have had a small crack that has since epithelialized. There is some concern for pressure-induced deep tissue damage on the lateral aspect of his foot, however. I used a curette to debride the callus from the suspected wound site, but as above, no break in the skin was identified at this time. I do want to keep an eye on the lateral aspect of his foot where they have tissue is a bit purpleish and soft. He will continue to apply moisturizer to his feet and check them daily. I suggested that he try using 40% urea cream on his calluses to keep them soft and supple and avoid cracking. He will continue to pad and protect the area. I will plan for him to follow-up in about 2 weeks, but should there be any deterioration of the tissue of concern, he will contact us sooner. Electronic Signature(s) Signed: 12/22/2023 4:28:38 PM By: Glenn Guess MD FACS Entered By: [name] on 12/22/2023 15:19:21 -------------------------------------------------------------------------------- SuperBill Details Patient Name: Date of Service: [name] IN, [name] 12/22/2023 Medical Record Number:  [id number] Patient Account Number: [id number] Date of Birth/Sex: Treating RN: 08/16/1971 (52 y.o. M) Primary Care Provider: Shan [name] Other Clinician: Referring Provider: Treating Provider/Extender: [name] in Treatment: 47 Diagnosis Coding ICD-10 Codes Code Description L84 Corns and callosities L97.522 Non-pressure chronic ulcer of  other part of left foot with fat layer exposed E11.621 Type 2 diabetes mellitus with foot ulcer Z89.422 Acquired absence of other left toe(s) Facility Procedures : CPT4 Code: [id number] Description: 97597 - DEBRIDE WOUND 1ST 20 SQ CM OR < ICD-10 Diagnosis Description L97.522 Non-pressure chronic ulcer of other part of left foot with fat layer exposed L84 Corns and callosities Modifier: Quantity: 1 Physician Procedures [name], [name] ([id number]): CPT4 Code Description [id number] - WC PHYS LEVEL 3 - EST PT ICD-10 Diagnosis Description L84 Corns and callosities L97.522 Non-pressure chronic ulcer of other part of left foot with fat E11.621 Type 2 diabetes mellitus  with foot ulcer Z89.422 Acquired absence of other left toe(s) [id number] Page 10 of 10: Quantity Modifier 1 layer exposed [name], [name] ([id number]): [id number] [id number] - WC PHYS DEBR WO ANESTH 20 SQ CM ICD-10 Diagnosis Description L97.522 Non-pressure chronic ulcer of other part of left foot with fat L84 Corns and callosities [id number] Page 10 of 10: 1 layer exposed Electronic Signature(s) Signed: 12/22/2023 4:28:38 PM By: Glenn Guess MD FACS Entered By: [name] on 12/22/2023 15:23:17

## 2023-12-23 NOTE — Telephone Encounter (Signed)
Pt left VM reporting pharmacy called with key code for repatha PA Key code [id number]

## 2023-12-25 ENCOUNTER — Other Ambulatory Visit (HOSPITAL_COMMUNITY): Payer: Self-pay

## 2023-12-29 ENCOUNTER — Other Ambulatory Visit: Payer: Self-pay

## 2023-12-29 ENCOUNTER — Other Ambulatory Visit: Payer: Self-pay | Admitting: Cardiology

## 2023-12-29 ENCOUNTER — Other Ambulatory Visit (HOSPITAL_COMMUNITY): Payer: Self-pay

## 2023-12-29 DIAGNOSIS — I214 Non-ST elevation (NSTEMI) myocardial infarction: Secondary | ICD-10-CM

## 2023-12-29 DIAGNOSIS — E1169 Type 2 diabetes mellitus with other specified complication: Secondary | ICD-10-CM

## 2023-12-29 MED ORDER — REPATHA SURECLICK 140 MG/ML ~~LOC~~ SOAJ
1.0000 mL | SUBCUTANEOUS | 11 refills | Status: DC
  Filled 2023-12-29 – 2024-03-03 (×9): qty 2, 28d supply, fill #0

## 2023-12-30 ENCOUNTER — Other Ambulatory Visit: Payer: Self-pay

## 2024-01-01 ENCOUNTER — Other Ambulatory Visit: Payer: Self-pay

## 2024-01-05 ENCOUNTER — Other Ambulatory Visit (HOSPITAL_COMMUNITY): Payer: Self-pay

## 2024-01-05 ENCOUNTER — Other Ambulatory Visit: Payer: Self-pay

## 2024-01-06 ENCOUNTER — Other Ambulatory Visit: Payer: Self-pay

## 2024-01-06 ENCOUNTER — Encounter (HOSPITAL_BASED_OUTPATIENT_CLINIC_OR_DEPARTMENT_OTHER): Payer: 59 | Attending: General Surgery | Admitting: General Surgery

## 2024-01-06 DIAGNOSIS — Z955 Presence of coronary angioplasty implant and graft: Secondary | ICD-10-CM | POA: Insufficient documentation

## 2024-01-06 DIAGNOSIS — L97522 Non-pressure chronic ulcer of other part of left foot with fat layer exposed: Secondary | ICD-10-CM | POA: Insufficient documentation

## 2024-01-06 DIAGNOSIS — I130 Hypertensive heart and chronic kidney disease with heart failure and stage 1 through stage 4 chronic kidney disease, or unspecified chronic kidney disease: Secondary | ICD-10-CM | POA: Insufficient documentation

## 2024-01-06 DIAGNOSIS — Z794 Long term (current) use of insulin: Secondary | ICD-10-CM | POA: Insufficient documentation

## 2024-01-06 DIAGNOSIS — E1122 Type 2 diabetes mellitus with diabetic chronic kidney disease: Secondary | ICD-10-CM | POA: Diagnosis not present

## 2024-01-06 DIAGNOSIS — I2510 Atherosclerotic heart disease of native coronary artery without angina pectoris: Secondary | ICD-10-CM | POA: Diagnosis not present

## 2024-01-06 DIAGNOSIS — L84 Corns and callosities: Secondary | ICD-10-CM | POA: Diagnosis not present

## 2024-01-06 DIAGNOSIS — E11621 Type 2 diabetes mellitus with foot ulcer: Secondary | ICD-10-CM | POA: Diagnosis not present

## 2024-01-06 DIAGNOSIS — Z89422 Acquired absence of other left toe(s): Secondary | ICD-10-CM | POA: Diagnosis not present

## 2024-01-06 DIAGNOSIS — I252 Old myocardial infarction: Secondary | ICD-10-CM | POA: Insufficient documentation

## 2024-01-06 DIAGNOSIS — N189 Chronic kidney disease, unspecified: Secondary | ICD-10-CM | POA: Insufficient documentation

## 2024-01-06 DIAGNOSIS — I5042 Chronic combined systolic (congestive) and diastolic (congestive) heart failure: Secondary | ICD-10-CM | POA: Insufficient documentation

## 2024-01-06 NOTE — Progress Notes (Signed)
 Beckel, Shannen ([id number]) [id number] Page 1 of 10 Visit Report for 01/06/2024 Chief Complaint Document Details Patient Name: Date of Service: [name] [place], [name] RO LD 01/06/2024 10:45 A M Medical Record Number: [id number] Patient Account Number: [id number] Date of Birth/Sex: Treating RN: [birth year] (53 y.o. M) Primary Care Provider: Brien Dover Other Clinician: Referring Provider: Treating Provider/Extender: Marolyn Delon Brien Dover Devra in Treatment: 50 Information Obtained from: Patient Chief Complaint 01/15/2023; left foot wound Electronic Signature(s) Signed: 01/06/2024 11:35:18 AM By: Marolyn Delon MD FACS Entered By: Marolyn Delon on 01/06/2024 08:35:18 -------------------------------------------------------------------------------- Debridement Details Patient Name: Date of Service: [name] [place], [name] 01/06/2024 10:45 A M Medical Record Number: [id number] Patient Account Number: [id number] Date of Birth/Sex: Treating RN: 26-Sep-1971 (52 y.o. NETTY Claven Pollen Primary Care Provider: Brien Dover Other Clinician: Referring Provider: Treating Provider/Extender: Marolyn Delon Brien Dover Devra in Treatment: 49 Debridement Performed for Assessment: Wound #4 Left,Lateral Foot Performed By: Physician Marolyn Delon, MD The following information was scribed by: Claven Pollen The information was scribed for: Marolyn Delon Debridement Type: Debridement Severity of Tissue Pre Debridement: Fat layer exposed Level of Consciousness (Pre-procedure): Awake and Alert Pre-procedure Verification/Time Out Yes - 11:00 Taken: Start Time: 11:00 Percent of Wound Bed Debrided: 100% T Area Debrided (cm): otal 0.39 Tissue and other material debrided: Non-Viable, Callus, Eschar, Slough, Subcutaneous, Skin: Dermis , Skin: Epidermis, Slough Level: Skin/Subcutaneous Tissue Debridement Description: Excisional Instrument: Curette Bleeding:  Minimum Hemostasis Achieved: Pressure Response to Treatment: Procedure was tolerated well Level of Consciousness (Post- Awake and Alert procedure): Post Debridement Measurements of Total Wound Length: (cm) 1 Width: (cm) 0.5 Depth: (cm) 0.1 Volume: (cm) 0.039 Character of Wound/Ulcer Post Debridement: Improved Severity of Tissue Post Debridement: Fat layer exposed RANA BARTERS ([id number]) [id number] Page 2 of 10 Post Procedure Diagnosis Same as Pre-procedure Electronic Signature(s) Signed: 01/06/2024 12:28:17 PM By: Marolyn Delon MD FACS Signed: 01/06/2024 12:30:54 PM By: Claven Pollen RN Entered By: Claven Pollen on 01/06/2024 08:08:38 -------------------------------------------------------------------------------- HPI Details Patient Name: Date of Service: [name] [place], [name] 01/06/2024 10:45 A M Medical Record Number: [id number] Patient Account Number: [id number] Date of Birth/Sex: Treating RN: 07/30/1971 (52 y.o. M) Primary Care Provider: Brien Dover Other Clinician: Referring Provider: Treating Provider/Extender: Marolyn Delon Brien Dover Devra in Treatment: 20 History of Present Illness HPI Description: ADMISSION 01/11/2020 This is a 53 year old man with uncontrolled type 2 diabetes with a recent hemoglobin A1c earlier this year of 13.4. He is on insulin  and glipizide . He does not take his blood sugars at home. He does have a follow-up with primary care later this month I believe on January 27. He tells us  that roughly a month ago he was walking with a shoe with a hole in his foot. He took the shoe off and there was an open wound at roughly the left fourth met head. This has significant undermining and raised edges. He has not noticed any purulence he does not feel unwell. More recently he was taking skin off the bottom of his foot and has a superficial area on the left fifth met head. He has not been offloading this. The patient  was in the ER on 12/20. They gave him Bactroban  which she has been using on the wound and 10 days worth of doxycycline . No x-rays were done. He has not had vascular studies. He is also been using hydrogen peroxide. Past medical history type 2 diabetes uncontrolled, chronic systolic heart failure, coronary artery disease with a history of congestive heart failure  with stents. Hypertension hyperlipidemia and chronic renal insufficiency ABI in this clinic was 1.14 on the left. 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 He is uncertain whether he would be able to work if we put him on some form of restriction 1/19; he is generally doing quite well. Using silver  alginate on the wounds. Things actually look better. He has a forefoot offloading boot which she seems to be compliant about. He has support at work to stay off his foot is much as possible which is gratifying. Culture I did last week showed a few Enterococcus faecalis. I am going to put him on Augmentin . I talked about ordering an x-ray in my note last week but that does not seem to have happened. We will review reorder the x-ray this week. 1/26; x-ray reordered last week was negative for osteomyelitis. We are using silver  alginate on the wound on the third and fifth met heads on the left. He is using a Darco forefoot off loader 2/2; the area on the fifth met head is closed. Third met head is still open with tunneling depth and thick callus. 2/9; the area on the fifth met head remains closed however the third met head again has a small open area on presentation with tunneling in depth and surrounded by thick callus. This looks like a pressure issue. We have been using silver  alginate 2/16; the area of the fifth metatarsal head remains closed however the area over the third metatarsal head again is a small open area but with some depth. I do not think this is changed much since last week. He is using Hydrofera Blue with  forefoot off loader. He is not able to use a total contact cast on the left leg because he needs his left leg at work [employer]]. Fortunately the wound does not look infected. I changed him to endoform today 2/26; the area of the fifth metatarsal head remains closed. The area of the third metatarsal head has an even smaller opening this time. I used endoform on this this looks improved. He is offloading this is much as he can and a forefoot off loader on the left. He cannot have a total contact cast because of work responsibilities 3/5; the area on the fifth metatarsal head remains closed the area on the third metatarsal head is also closed on the left foot. 01/15/2023 Mr. 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 He has been following for podiatry for several months for this issue. He is currently using wet-to-dry saline dressings. He currently denies signs of infection. [place] note from 1/17; Patient presents with a 63-month history of nonhealing ulcer to the left foot secondary to diabetes and inability to offload well the area. He has had multiple debridements in the past to his feet bilaterally. He has had resection of the fourth left met head in the fifth toe Secondary to osteomyelitis. We discussed the importance of glycemic control for wound healing. Due to his blood glucose levels being elevated he is at high risk for infection and thus further amputation. He expressed understanding. He states he is supposed to be referred to an endocrinologist at [facility]  however the referral fell through. Unclear what happened. Offered a referral to  endocrinology at [facility]. Patient was agreeable.Furthermore we discussed the importance of aggressive offloading for his wound healing. This will be the most difficult part of the treatment plan for the patient to do. We discussed a total contact cast however he has declined that at this time. 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 He is currently using a surgical shoe with a peg assist. It does not fit well so we will give him a new one today. I do not think this is enough offloading. If he is not able to offload this area he will likely end up with a BKA. He is well aware of this. For now I recommended Medihoney and Hydrofera Blue for dressing changes. He will follow-up in our [place] office Since this is closer for him. 1/26; patient presents for follow-up. Last clinic visit I had seen him in [place] and we transferred him to [place] since this is a closer location for him. Progress note above from that visit. Over the past week He has been using Medihoney and Hydrofera Blue to the wound bed. He has been using his surgical NINA, HOAR ([id number]) [id number] Page 3 of 10 shoe with peg assist. He has no issues or complaints today. We discussed doing the total contact cast and he was agreeable to having this placed at next clinic visit. 2/2; patient presents for follow-up. He has been using Vashe wet-to-dry dressings. Plan is for the total contact cast today. 2/5; patient presents for follow-up. The cast was placed 3 days ago. He tolerated this well however had a lot of drainage. He currently denies signs of infection. 2/9; patient presents for follow-up. At last clinic visit we held off on replacing the cast due to drainage. He has been using Vashe wet-to-dry dressings and he is currently taking the oral antibiotics prescribed without issues. He declines a total contact cast today. We  ran insurance verification for skin substitute and due to cost patient declines having this placed. 2/16; patient presents for follow-up. He has decided not to follow-up with podiatry. He continues to decline the total contact cast. He has been using Vashe wet- to-dry dressings. He has not been wearing his surgical shoe with peg assist. He currently denies signs of infection. 3/1; patient presents for follow-up. He has been doing Vashe wet-to-dry dressings. He has a surgical shoe with peg assist. We discussed potentially doing a skin substitute for which she has been approved for by insurance. He knows the out-of-pocket cost of this and would still like to proceed with having this placed today. He denies the current total contact cast. 3/8; patient presents for follow-up. He states that the skin substitute came off after a few days and he has been using Vashe wet-to-dry dressings. He has mild odor to the wound bed on exam. 3/15; patient presents for follow-up. He has been using Vashe wet-to-dry dressings and started his course of Augmentin . He reports improvement in wound healing. He has no issues or complaints today. 3/21; patient presents for follow-up. He continues to use Vashe wet-to-dry dressings. He has no issues or complaints today. 3/28; patient presents for follow-up. He has been using Vashe wet-to-dry dressings. He has no issues or complaints today. He has been using his peg assist with surgical shoe. 4/11; patient presents for follow-up. He has been using  Vashe wet-to-dry dressings. The periwound is macerated. He has been using his peg assist with surgical shoe. He has no issues or complaints today. 4/25; patient presents for follow-up. Has been using Vashe wet-to-dry dressings. He has been using zinc oxide to the periwound. For offloading he has been using his peg assist in surgical shoe. He has no issues or complaints today. 5/9; patient presents for follow-up. He has been using Vashe  wet-to-dry dressings. He has no issues or complaints today. He has a surgical shoe with peg assist for offloading. 5/23; patient presents for follow up. He has been using Vashe to clean the wound bed and collagen and blast X with dressing changes. [place] went to the beach [place] 2 weeks. Wound is bigger. 5/31; patient presents for follow-up. He has been using Vashe wet-to-dry dressings. Wound is stable but with healthier granulation tissue. Patient would like to hold off on the total contact cast and try a knee scooter for offloading. 6/13; patient presents for follow-up. He has been using Vashe wet-to-dry dressings. He has been using a knee scooter for offloading however states he felt unstable on this and decided to buy an electric scooter. This arrived in the mail today. He would like to hold off on the left total contact cast. Wound is slightly smaller today. 6/27; patient presents for follow-up. He has been using Vashe wet-to-dry dressings. He is using an art gallery manager for mobility to help relieve the pressure off the wound bed. Wound is smaller today. He wants to hold off on doing the total contact cast as he is showing signs of improvement with wound healing. 7/11; patient presents for follow-up. He has been using Vashe wet-to-dry dressings and his electric scooter for offloading. Wound is slightly smaller. 7/25; patient presents for follow up. He has been using Vashe wet-to-dry dressings and he reports using his electric scooter for offloading. Wound is larger. 7/30; patient presents for follow-up. He has been using Vashe wet-to-dry dressings. He has taken Augmentin  as prescribed without issues. Overall wound has healthier granulation tissue. 8/1; patient presents for follow-up. He presents for obligatory cast change. He tolerated the cast well and has no issues or complaints today. We have been using antibiotic ointment with Hydrofera Blue under the cast. Wound is slightly smaller. 8/8; patient  presents for follow-up. We have been using antibiotic ointment with Hydrofera Blue under the total contact cast. Wound is smaller. He has no issues or complaints today. 8/13; patient presents for follow-up. We have been using antibiotic ointment with Hydrofera Blue under the total contact cast. He comes in today because the top part of the cast over the foot has sunken as well as on the side. He wanted to assure that there were no issues. 8/22; patient presents for follow-up. He has been using Hydrofera Blue to the wound bed. Previously we have been using a total contact cast. We do not have total contact casts due to manufacturing issues available for patients. He has no issues or complaints. He denies signs of infection. 8/27; patient presents for follow-up. He has been using Vashe wet-to-dry dressings. T contact cast is available for placement today. He was agreeable to otal proceed with this. He denies signs of infection. 9/3; patient presents for follow-up. We have been using Hydrofera Blue with antibiotic ointment under the total contact cast for the past week. He has tolerated this well. The wound is smaller. Unfortunately we do not have a total contact cast in office today for replacement as there  is been set designer issues. 9/5; patient was seen earlier this week and plan was for a total contact cast but there is none available. The cast finally arrived in patient has come in today to have this placed. We will continue with Hydrofera Blue and antibiotic ointment under the cast. 9/12; patient presents for follow-up. We have been using Hydrofera Blue and antibiotic ointment under the cast. Wound is stable. He denies signs of infection. 09/17/2023: The wound is measuring smaller today. The surface appears healthy and there is minimal accumulation of slough. 09/23/2023: The wound external measurements remain about the same, but it is shallower and visually appears smaller. 09/30/2023: The wound  is covered with a layer of callus. Underneath the callus, there is a tiny superficial residual opening. 10/14/2023: He returns after being away for 2 weeks, during which he was out of a cast. Not surprisingly, his wound has reopened substantially. It remains clean, however. 10/21/2023: We have recovered most of what was lost as far as progress with his absence from clinic. The wound did measure smaller today. It is a little bit Lange, Nicco ([id number]) [id number] Page 4 of 10 macerated around the edges and he reports that he thinks some water got in over the top of his cast while showering. 10/28/2023: The wound measures smaller today. There is no tissue maceration. There is some slough on the surface and a little bit of callus accumulation around the edges. 11/04/2023: The wound measured a little bit deeper today and had some undermining, but this seems to be due to mainly to accumulated callus. The surface has a bit of slough. He has been approved for Grafix so we will plan to apply that today. 11/10/2023: He has responded beautifully to Grafix application. His wound is down to just a couple of millimeters. There is some slough on the surface. 11/18/2023: His wound is freshly healed. 11/25/2023: After an additional week in the cast, the skin of the healed surface has greater strength and integrity. 12/22/2023: The patient contacted the clinic due to concern that his wound had reopened. He noticed some darkening underneath the callus that had built up and then then a portion of the callus fell off. There was still a dark central area, concerning for wound reopening. 01/06/2024: He is here for recheck of the wound site. The callus has built up quite extensively and there is some discoloration underneath. Once the callus was debrided, there were 2 very tiny openings, about a millimeter each. No malodor or purulent drainage. No other concern for infection. Electronic  Signature(s) Signed: 01/06/2024 11:36:24 AM By: Marolyn Nest MD FACS Entered By: Marolyn Nest on 01/06/2024 08:36:24 -------------------------------------------------------------------------------- Physical Exam Details Patient Name: Date of Service: [name] [place], [name] 01/06/2024 10:45 A M Medical Record Number: [id number] Patient Account Number: [id number] Date of Birth/Sex: Treating RN: [birth year] (52 y.o. M) Primary Care Provider: Brien Dover Other Clinician: Referring Provider: Treating Provider/Extender: Marolyn Nest Brien Dover Devra in Treatment: 49 Constitutional . . . . no acute distress. Respiratory Normal work of breathing on room air.. Notes 01/06/2024: The callus has built up quite extensively and there is some discoloration underneath. Once the callus was debrided, there were 2 very tiny openings, about a millimeter each. No malodor or purulent drainage. No other concern for infection. The lateral aspect of his foot does not show any signs of pressure- induced tissue injury, an improvement from his last visit. Electronic Signature(s) Signed: 01/06/2024 11:37:26 AM By: Marolyn Nest MD FACS Entered By:  Marolyn Nest on 01/06/2024 08:37:26 -------------------------------------------------------------------------------- Physician Orders Details Patient Name: Date of Service: [name] [place], [name] RO LD 01/06/2024 10:45 A M Medical Record Number: [id number] Patient Account Number: [id number] Date of Birth/Sex: Treating RN: 05/23/1971 (53 y.o. NETTY Claven Pollen Primary Care Provider: Brien Dover Other Clinician: Referring Provider: Treating Provider/Extender: Marolyn Nest Brien Dover Devra in Treatment: 77 Verbal / Phone Orders: No Diagnosis Coding Follow-up Appointments PARRIS, CUDWORTH ([id number]) [id number] Page 5 of 10 ppointment in 1 week. - Dr. Marolyn Return A Other: - purchase 40% Urea cream from  [facility] May shower and wash wound with soap and water. Off-Loading Wound #4 Left,Lateral Foot Other: - keep callus area padded and inspect daily, moisturize with urea cream to callus daily. Wear PEG ASSIST insert+ Medical shoe until the New shoes and Inserts come to you Wound Treatment Wound #4 - Foot Wound Laterality: Left, Lateral Cleanser: Soap and Water Discharge Instructions: May shower and wash wound with dial  antibacterial soap and water prior to dressing change. Prim Dressing: Optifoam Non-Adhesive Dressing, 4x4 in ary Discharge Instructions: Apply to wound bed as instructed Prim Dressing: Promogran Prisma Matrix, 4.34 (sq in) (silver  collagen) ary Discharge Instructions: Moisten collagen with saline or hydrogel Secondary Dressing: Woven Gauze Sponge, Non-Sterile 4x4 in Discharge Instructions: Apply over primary dressing as directed. Secured With: Elastic Bandage 4 inch (ACE bandage) Discharge Instructions: Secure with ACE bandage as directed. Secured With: 36M Medipore H Soft Cloth Surgical T ape, 4 x 10 (in/yd) Discharge Instructions: Secure with tape as directed. Electronic Signature(s) Signed: 01/06/2024 12:28:17 PM By: Marolyn Nest MD FACS Entered By: Marolyn Nest on 01/06/2024 08:38:10 -------------------------------------------------------------------------------- Problem List Details Patient Name: Date of Service: [name] [place], [name] 01/06/2024 10:45 A M Medical Record Number: [id number] Patient Account Number: [id number] Date of Birth/Sex: Treating RN: 02-08-1971 (52 y.o. M) Primary Care Provider: Brien Dover Other Clinician: Referring Provider: Treating Provider/Extender: Marolyn Nest Brien Dover Devra in Treatment: 56 Active Problems ICD-10 Encounter Code Description Active Date MDM Diagnosis [phone] Non-pressure chronic ulcer of other part of left foot with fat layer exposed 01/24/2023 No Yes L84 Corns and  callosities 12/22/2023 No Yes E11.621 Type 2 diabetes mellitus with foot ulcer 01/24/2023 No Yes Z89.422 Acquired absence of other left toe(s) 01/24/2023 No Yes Groom, Herley ([id number]) [phone].pdf Page 6 of 10 Inactive Problems Resolved Problems Electronic Signature(s) Signed: 01/06/2024 11:28:28 AM By: Marolyn Nest MD FACS Entered By: Marolyn Nest on 01/06/2024 08:28:28 -------------------------------------------------------------------------------- Progress Note Details Patient Name: Date of Service: [name] [place], [name] 01/06/2024 10:45 A M Medical Record Number: [id number] Patient Account Number: [id number] Date of Birth/Sex: Treating RN: [birth year] (52 y.o. M) Primary Care Provider: Brien Dover Other Clinician: Referring Provider: Treating Provider/Extender: Marolyn Nest Brien Dover Devra in Treatment: 56 Subjective Chief Complaint Information obtained from Patient 01/15/2023; left foot wound History of Present Illness (HPI) ADMISSION 01/11/2020 This is a 53 year old man with uncontrolled type 2 diabetes with a recent hemoglobin A1c earlier this year of 13.4. He is on insulin  and glipizide . He does not take his blood sugars at home. He does have a follow-up with primary care later this month I believe on January 27. He tells us  that roughly a month ago he was walking with a shoe with a hole in his foot. He took the shoe off and there was an open wound at roughly the left fourth met head. This has significant undermining and raised edges. He has not noticed any purulence he does  not feel unwell. More recently he was taking skin off the bottom of his foot and has a superficial area on the left fifth met head. He has not been offloading this. The patient was in the ER on 12/20. They gave him Bactroban  which she has been using on the wound and 10 days worth of doxycycline . No x-rays were done. He has not had vascular studies. He is also  been using hydrogen peroxide. Past medical history type 2 diabetes uncontrolled, chronic systolic heart failure, coronary artery disease with a history of congestive heart failure with stents. Hypertension hyperlipidemia and chronic renal insufficiency ABI in this clinic was 1.14 on the left. 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 He is uncertain whether he would be able to work if we put him on some form of restriction 1/19; he is generally doing quite well. Using silver  alginate on the wounds. Things actually look better. He has a forefoot offloading boot which she seems to be compliant about. He has support at work to stay off his foot is much as possible which is gratifying. Culture I did last week showed a few Enterococcus faecalis. I am going to put him on Augmentin . I talked about ordering an x-ray in my note last week but that does not seem to have happened. We will review reorder the x-ray this week. 1/26; x-ray reordered last week was negative for osteomyelitis. We are using silver  alginate on the wound on the third and fifth met heads on the left. He is using a Darco forefoot off loader 2/2; the area on the fifth met head is closed. Third met head is still open with tunneling depth and thick callus. 2/9; the area on the fifth met head remains closed however the third met head again has a small open area on presentation with tunneling in depth and surrounded by thick callus. This looks like a pressure issue. We have been using silver  alginate 2/16; the area of the fifth metatarsal head remains closed however the area over the third metatarsal head again is a small open area but with some depth. I do not think this is changed much since last week. He is using Hydrofera Blue with forefoot off loader. He is not able to use a total contact cast on the left leg because he needs his left leg at work [employer]]. Fortunately the wound does not look infected. I  changed him to endoform today 2/26; the area of the fifth metatarsal head remains closed. The area of the third metatarsal head has an even smaller opening this time. I used endoform on this this looks improved. He is offloading this is much as he can and a forefoot off loader on the left. He cannot have a total contact cast because of work responsibilities 3/5; the area on the fifth metatarsal head remains closed the area on the third metatarsal head is also closed on the left foot. 01/15/2023 Mr. 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 He has been following for podiatry for several months for this issue. He is currently using wet-to-dry saline dressings. He currently denies signs of infection. [place] note from 1/17;  Patient presents with a 75-month history of nonhealing ulcer to the left foot secondary to diabetes and inability to offload well the area. He has had multiple debridements in the past to his feet bilaterally. He has had resection of the fourth left met head in the fifth toe Secondary to osteomyelitis. We discussed the importance of glycemic control for wound healing. Due to his blood glucose levels being elevated he is at high risk for infection and thus further amputation. He expressed understanding. He states he is supposed to be referred to an endocrinologist at [facility] however the referral fell through. Unclear what happened. Offered a referral to endocrinology at [facility]. Patient was agreeable.Furthermore we discussed the importance of aggressive offloading for his wound healing. This will be the most difficult part of the treatment plan  for the patient to do. We discussed a total contact cast however he has declined that at this time. 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 He is currently using a surgical shoe with a peg assist. It does not fit well so we will give him a new one today. I do not think this is enough offloading. If he is Brining, Ismail ([id number]) [id number] Page 7 of 10 not able to offload this area he will likely end up with a BKA. He is well aware of this. For now I recommended Medihoney and Hydrofera Blue for dressing changes. He will follow-up in our [place] office Since this is closer for him. 1/26; patient presents for follow-up. Last clinic visit I had seen him in [place] and we transferred him to [place] since this is a closer location for him. Progress note above from that visit. Over the past week He has been using Medihoney and Hydrofera Blue to the wound bed. He has been using his surgical shoe with peg assist. He has no issues or complaints today. We discussed doing the total contact cast and he was agreeable to having this placed at next clinic visit. 2/2; patient presents for follow-up. He has been using Vashe wet-to-dry dressings. Plan is for the total contact cast today. 2/5; patient presents for follow-up. The cast was placed 3 days ago. He tolerated this well however had a lot of drainage. He currently denies signs of infection. 2/9; patient presents for follow-up. At last clinic visit we held off on replacing the cast due to drainage. He has been using Vashe wet-to-dry dressings and he is currently taking the oral antibiotics prescribed without issues. He declines a total contact cast today. We ran insurance verification for skin substitute and due to cost patient declines having this placed. 2/16; patient presents for follow-up. He has decided not to follow-up with podiatry. He  continues to decline the total contact cast. He has been using Vashe wet- to-dry dressings. He has not been wearing his surgical shoe with peg assist. He currently denies signs of infection. 3/1; patient presents for follow-up. He has been doing Vashe wet-to-dry dressings. He has a surgical shoe with peg assist. We discussed potentially doing a skin substitute for which she has been approved for by insurance. He knows the out-of-pocket cost of this and would still like to proceed with having this placed today. He denies the current total contact cast. 3/8; patient presents for follow-up. He states that the skin substitute came off after a few days and he has been using Vashe wet-to-dry dressings. He  has mild odor to the wound bed on exam. 3/15; patient presents for follow-up. He has been using Vashe wet-to-dry dressings and started his course of Augmentin . He reports improvement in wound healing. He has no issues or complaints today. 3/21; patient presents for follow-up. He continues to use Vashe wet-to-dry dressings. He has no issues or complaints today. 3/28; patient presents for follow-up. He has been using Vashe wet-to-dry dressings. He has no issues or complaints today. He has been using his peg assist with surgical shoe. 4/11; patient presents for follow-up. He has been using Vashe wet-to-dry dressings. The periwound is macerated. He has been using his peg assist with surgical shoe. He has no issues or complaints today. 4/25; patient presents for follow-up. Has been using Vashe wet-to-dry dressings. He has been using zinc oxide to the periwound. For offloading he has been using his peg assist in surgical shoe. He has no issues or complaints today. 5/9; patient presents for follow-up. He has been using Vashe wet-to-dry dressings. He has no issues or complaints today. He has a surgical shoe with peg assist for offloading. 5/23; patient presents for follow up. He has been using Vashe to clean  the wound bed and collagen and blast X with dressing changes. [place] went [place] the beach [place] 2 [place]. Wound is bigger. 5/31; patient presents for follow-up. He has been using Vashe wet-to-dry dressings. Wound is stable but with healthier granulation tissue. Patient would like to hold off on the total contact cast and try a knee scooter for offloading. 6/13; patient presents for follow-up. He has been using Vashe wet-to-dry dressings. He has been using a knee scooter for offloading however states he felt unstable on this and decided to buy an electric scooter. This arrived in the mail today. He would like to hold off on the left total contact cast. Wound is slightly smaller today. 6/27; patient presents for follow-up. He has been using Vashe wet-to-dry dressings. He is using an art gallery manager for mobility to help relieve the pressure off the wound bed. Wound is smaller today. He wants to hold off on doing the total contact cast as he is showing signs of improvement with wound healing. 7/11; patient presents for follow-up. He has been using Vashe wet-to-dry dressings and his electric scooter for offloading. Wound is slightly smaller. 7/25; patient presents for follow up. He has been using Vashe wet-to-dry dressings and he reports using his electric scooter for offloading. Wound is larger. 7/30; patient presents for follow-up. He has been using Vashe wet-to-dry dressings. He has taken Augmentin  as prescribed without issues. Overall wound has healthier granulation tissue. 8/1; patient presents for follow-up. He presents for obligatory cast change. He tolerated the cast well and has no issues or complaints today. We have been using antibiotic ointment with Hydrofera Blue under the cast. Wound is slightly smaller. 8/8; patient presents for follow-up. We have been using antibiotic ointment with Hydrofera Blue under the total contact cast. Wound is smaller. He has no issues or complaints today. 8/13; patient  presents for follow-up. We have been using antibiotic ointment with Hydrofera Blue under the total contact cast. He comes in today because the top part of the cast over the foot has sunken as well as on the side. He wanted to assure that there were no issues. 8/22; patient presents for follow-up. He has been using Hydrofera Blue to the wound bed. Previously we have been using a total contact cast. We do not have total contact casts due to  manufacturing issues available for patients. He has no issues or complaints. He denies signs of infection. 8/27; patient presents for follow-up. He has been using Vashe wet-to-dry dressings. T contact cast is available for placement today. He was agreeable to otal proceed with this. He denies signs of infection. 9/3; patient presents for follow-up. We have been using Hydrofera Blue with antibiotic ointment under the total contact cast for the past week. He has tolerated this well. The wound is smaller. Unfortunately we do not have a total contact cast in office today for replacement as there is been manufacturing issues. 9/5; patient was seen earlier this week and plan was for a total contact cast but there is none available. The cast finally arrived in patient has come in today to have this placed. We will continue with Hydrofera Blue and antibiotic ointment under the cast. 9/12; patient presents for follow-up. We have been using Hydrofera Blue and antibiotic ointment under the cast. Wound is stable. He denies signs of infection. 09/17/2023: The wound is measuring smaller today. The surface appears healthy and there is minimal accumulation of slough. 09/23/2023: The wound external measurements remain about the same, but it is shallower and visually appears smaller. 09/30/2023: The wound is covered with a layer of callus. Underneath the callus, there is a tiny superficial residual opening. Star, Yusuke ([id number]) [id number] Page 8 of  10 10/14/2023: He returns after being away for 2 weeks, during which he was out of a cast. Not surprisingly, his wound has reopened substantially. It remains clean, however. 10/21/2023: We have recovered most of what was lost as far as progress with his absence from clinic. The wound did measure smaller today. It is a little bit macerated around the edges and he reports that he thinks some water got in over the top of his cast while showering. 10/28/2023: The wound measures smaller today. There is no tissue maceration. There is some slough on the surface and a little bit of callus accumulation around the edges. 11/04/2023: The wound measured a little bit deeper today and had some undermining, but this seems to be due to mainly to accumulated callus. The surface has a bit of slough. He has been approved for Grafix so we will plan to apply that today. 11/10/2023: He has responded beautifully to Grafix application. His wound is down to just a couple of millimeters. There is some slough on the surface. 11/18/2023: His wound is freshly healed. 11/25/2023: After an additional week in the cast, the skin of the healed surface has greater strength and integrity. 12/22/2023: The patient contacted the clinic due to concern that his wound had reopened. He noticed some darkening underneath the callus that had built up and then then a portion of the callus fell off. There was still a dark central area, concerning for wound reopening. 01/06/2024: He is here for recheck of the wound site. The callus has built up quite extensively and there is some discoloration underneath. Once the callus was debrided, there were 2 very tiny openings, about a millimeter each. No malodor or purulent drainage. No other concern for infection. Objective Constitutional no acute distress. Vitals Time Taken: 10:41 AM, Height: 78 in, Weight: 275 lbs, BMI: 31.8, Temperature: 97.5 F, Pulse: 82 bpm, Respiratory Rate: 20 breaths/min,  Blood Pressure: 111/79 mmHg. Respiratory Normal work of breathing on room air.. General Notes: 01/06/2024: The callus has built up quite extensively and there is some discoloration underneath. Once the callus was debrided, there were 2 very tiny openings,  about a millimeter each. No malodor or purulent drainage. No other concern for infection. The lateral aspect of his foot does not show any signs of pressure-induced tissue injury, an improvement from his last visit. Integumentary (Hair, Skin) Wound #4 status is Open. Original cause of wound was Pressure Injury. The date acquired was: 12/22/2023. The wound has been in treatment 2 weeks. The wound is located on the Left,Lateral Foot. The wound measures 0.1cm length x 0.1cm width x 0.1cm depth; 0.008cm^2 area and 0.001cm^3 volume. There is Fat Layer (Subcutaneous Tissue) exposed. There is no tunneling or undermining noted. There is a medium amount of serosanguineous drainage noted. The wound margin is distinct with the outline attached to the wound base. There is no granulation within the wound bed. There is a large (67-100%) amount of necrotic tissue within the wound bed including Eschar and Adherent Slough. The periwound skin appearance exhibited: Callus. Assessment Active Problems ICD-10 Non-pressure chronic ulcer of other part of left foot with fat layer exposed Corns and callosities Type 2 diabetes mellitus with foot ulcer Acquired absence of other left toe(s) Procedures Wound #4 Pre-procedure diagnosis of Wound #4 is a Diabetic Wound/Ulcer of the Lower Extremity located on the Left,Lateral Foot .Severity of Tissue Pre Debridement is: Fat layer exposed. There was a Excisional Skin/Subcutaneous Tissue Debridement with a total area of 0.39 sq cm performed by Marolyn Nest, MD. With the following instrument(s): Curette to remove Non-Viable tissue/material. Material removed includes Eschar, Callus, Subcutaneous Tissue, Slough, Skin: Dermis,  and Skin: Epidermis. No specimens were taken. A time out was conducted at 11:00, prior to the start of the procedure. A Minimum amount of bleeding was controlled with Pressure. The procedure was tolerated well. Post Debridement Measurements: 1cm length x 0.5cm width x 0.1cm depth; 0.039cm^3 volume. Character of Wound/Ulcer Post Debridement is improved. Severity of Tissue Post Debridement is: Fat layer exposed. Post procedure Diagnosis Wound #4: Same as Pre-Procedure RUGER, SAXER ([id number]) [phone].pdf Page 9 of 10 Plan Follow-up Appointments: Return Appointment in 1 week. - Dr. Marolyn Other: - purchase 40% Urea cream from [employer] Hygiene: May shower and wash wound with soap and water. Off-Loading: Wound #4 Left,Lateral Foot: Other: - keep callus area padded and inspect daily, moisturize with urea cream to callus daily. Wear PEG ASSIST insert+ Medical shoe until the New shoes and Inserts come to you WOUND #4: - Foot Wound Laterality: Left, Lateral Cleanser: Soap and Water Discharge Instructions: May shower and wash wound with dial  antibacterial soap and water prior to dressing change. Prim Dressing: Optifoam Non-Adhesive Dressing, 4x4 in ary Discharge Instructions: Apply to wound bed as instructed Prim Dressing: Promogran Prisma Matrix, 4.34 (sq in) (silver  collagen) ary Discharge Instructions: Moisten collagen with saline or hydrogel Secondary Dressing: Woven Gauze Sponge, Non-Sterile 4x4 in Discharge Instructions: Apply over primary dressing as directed. Secured With: Elastic Bandage 4 inch (ACE bandage) Discharge Instructions: Secure with ACE bandage as directed. Secured With: 26M Medipore H Soft Cloth Surgical T ape, 4 x 10 (in/yd) Discharge Instructions: Secure with tape as directed. 01/06/2024: He is here for recheck of the wound site. The callus has built up quite extensively and there is some discoloration underneath. Once the callus  was debrided, there were 2 very tiny openings, about a millimeter each. No malodor or purulent drainage. No other concern for infection. I used a curette to debride callus, skin, slough, and subcutaneous tissue from the site. We will use Prisma silver  collagen with a peg assist offloading shoe; I do  not think this wound warrants going back into a cast at this time. He will follow-up in 1 week. Electronic Signature(s) Signed: 01/06/2024 11:39:04 AM By: Marolyn Nest MD FACS Entered By: Marolyn Nest on 01/06/2024 08:39:04 -------------------------------------------------------------------------------- SuperBill Details Patient Name: Date of Service: [name] [place], [name] 01/06/2024 Medical Record Number: [id number] Patient Account Number: [id number] Date of Birth/Sex: Treating RN: December 18, 1971 (52 y.o. M) Primary Care Provider: Brien Dover Other Clinician: Referring Provider: Treating Provider/Extender: Marolyn Nest Brien Dover Devra in Treatment: 49 Diagnosis Coding ICD-10 Codes Code Description [phone] Non-pressure chronic ulcer of other part of left foot with fat layer exposed L84 Corns and callosities E11.621 Type 2 diabetes mellitus with foot ulcer Z89.422 Acquired absence of other left toe(s) Facility Procedures : DANYON MCGINNESS Code: [id number] , Sujay ([id number]) Description: [phone] - DEB SUBQ TISSUE 20 SQ CM/< ICD-10 Diagnosis Description L97.522 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.621 Type 2 diabetes mellitus with foot ulcer L84 Corns and callosities Z89.422 Acquired  absence of other left toe(s) [id number] Modifier: [id number] P Quantity: 1 age 56 of 47 Physician Procedures : CPT4 Code Description Modifier [phone] 11042 - WC PHYS SUBQ TISS 20 SQ CM ICD-10 Diagnosis Description L97.522 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.621 Type 2 diabetes mellitus with foot  ulcer L84 Corns and  callosities Z89.422 Acquired absence of other left toe(s) Quantity: 1 Electronic Signature(s) Signed: 01/06/2024 11:39:42 AM By: Marolyn Nest MD FACS Entered By: Marolyn Nest on 01/06/2024 08:39:41

## 2024-01-06 NOTE — Progress Notes (Signed)
 Ezekiel, Cote ([id number]) [id number] Page 1 of 7 Visit Report for 01/06/2024 Arrival Information Details Patient Name: Date of Service: [name] [place], [name] RO LD 01/06/2024 10:45 A M Medical Record Number: [id number] Patient Account Number: [id number] Date of Birth/Sex: Treating [name]: [birth year] (53 y.o. M) Primary Care Nashaly Dorantes: Brien Dover Other Clinician: Referring Kenlee Maler: Treating Tondalaya Perren/Extender: [name]: 30 Visit Information History Since Last Visit Added or deleted any medications: No Patient Arrived: Ambulatory Any new allergies or adverse reactions: No Arrival Time: 10:41 Had a fall or experienced change in No Accompanied By: self activities of daily living that may affect Transfer Assistance: None risk of falls: Patient Identification Verified: Yes Signs or symptoms of abuse/neglect since last visito No Secondary Verification Process Completed: Yes Hospitalized since last visit: No Patient Requires Transmission-Based Precautions: No Implantable device outside of the clinic excluding No Patient Has Alerts: Yes cellular tissue based products placed in the center Patient Alerts: 08/2021 ABI:1.2 TBI 0.79 since last visit: Pain Present Now: No Electronic Signature(s) Signed: 01/06/2024 11:10:02 AM By: Okey Bonier Entered By: Okey Bonier on 01/06/2024 07:41:41 -------------------------------------------------------------------------------- Encounter Discharge Information Details Patient Name: Date of Service: [name], [name] 01/06/2024 10:45 A M Medical Record Number: [id number] Patient Account Number: [id number] Date of Birth/Sex: Treating [name]: 1971/04/07 (52 y.o. [name] Primary Care Jolene Guyett: Brien Dover Other Clinician: Referring Jarquez Mestre: Treating Chesnie Capell/Extender: [name]: 39 Encounter Discharge Information Items Post Procedure  Vitals Discharge Condition: Stable Temperature (F): 97.5 Ambulatory Status: Ambulatory Pulse (bpm): 82 Discharge Destination: Home Respiratory Rate (breaths/min): 20 Transportation: Private Auto Blood Pressure (mmHg): 111/79 Accompanied By: self Schedule Follow-up Appointment: Yes Clinical Summary of Care: Patient Declined Electronic Signature(s) Signed: 01/06/2024 12:30:54 PM By: [name] Entered By: [name] on 01/06/2024 09:30:29 RANA BARTERS ([id number]) [id number] Page 2 of 7 -------------------------------------------------------------------------------- Lower Extremity Assessment Details Patient Name: Date of Service: [name] [place], [name] RO LD 01/06/2024 10:45 A M Medical Record Number: [id number] Patient Account Number: [id number] Date of Birth/Sex: Treating [name]: 1971/06/10 (53 y.o. [name] Primary Care Alfie Alderfer: Brien Dover Other Clinician: Referring Jahmel Flannagan: Treating Lacy Taglieri/Extender: [name]: 49 Edema Assessment Assessed: Colletta: No] Glenis: No] Edema: [Left: N] [Right: o] Calf Left: Right: Point of Measurement: 43 cm From Medial Instep 37 cm Ankle Left: Right: Point of Measurement: 9 cm From Medial Instep 21.3 cm Vascular Assessment Pulses: Dorsalis Pedis Palpable: [Left:Yes] Extremity colors, hair growth, and conditions: Extremity Color: [Left:Normal] Hair Growth on Extremity: [Left:Yes] Temperature of Extremity: [Left:Warm] Capillary Refill: [Left:< 3 seconds] Dependent Rubor: [Left:No No] Electronic Signature(s) Signed: 01/06/2024 12:30:54 PM By: [name] Entered By: [name] on 01/06/2024 07:59:17 -------------------------------------------------------------------------------- Multi Wound Chart Details Patient Name: Date of Service: [name], [name] 01/06/2024 10:45 A M Medical Record Number: [id number] Patient Account Number:  [id number] Date of Birth/Sex: Treating [name]: Dec 22, 1971 (52 y.o. M) Primary Care Johnye Kist: Brien Dover Other Clinician: Referring Arleth Mccullar: Treating Shamira Toutant/Extender: [name]: 49 Vital Signs Height(in): 78 Pulse(bpm): 82 Weight(lbs): 275 Blood Pressure(mmHg): 111/79 Body Mass Index(BMI): 31.8 Temperature(F): 97.5 Respiratory Rate(breaths/min): 20 [4:Photos:] [N/A:N/A] Left, Lateral Foot N/A N/A Wound Location: Pressure Injury N/A N/A Wounding Event: Diabetic Wound/Ulcer of the Lower N/A N/A Primary Etiology: Extremity Chronic sinus problems/congestion, N/A N/A Comorbid History: Congestive Heart Failure, Coronary Artery Disease, Hypertension, Myocardial Infarction, Type II Diabetes, Neuropathy 12/22/2023 N/A N/A Date Acquired: 2 N/A N/A Weeks  of [name]: Open N/A N/A Wound Status: No N/A N/A Wound Recurrence: 0.1x0.1x0.1 N/A N/A Measurements L x W x D (cm) 0.008 N/A N/A A (cm) : rea 0.001 N/A N/A Volume (cm) : 95.90% N/A N/A % Reduction in A rea: 95.00% N/A N/A % Reduction in Volume: Grade 2 N/A N/A Classification: Medium N/A N/A Exudate A mount: Serosanguineous N/A N/A Exudate Type: red, brown N/A N/A Exudate Color: Distinct, outline attached N/A N/A Wound Margin: None Present (0%) N/A N/A Granulation A mount: Large (67-100%) N/A N/A Necrotic A mount: Eschar, Adherent Slough N/A N/A Necrotic Tissue: Fat Layer (Subcutaneous Tissue): Yes N/A N/A Exposed Structures: Fascia: No Tendon: No Muscle: No Joint: No Bone: No Large (67-100%) N/A N/A Epithelialization: Debridement - Excisional N/A N/A Debridement: Pre-procedure Verification/Time Out 11:00 N/A N/A Taken: Necrotic/Eschar, Callus, N/A N/A Tissue Debrided: Subcutaneous, Slough Skin/Subcutaneous Tissue N/A N/A Level: 0.39 N/A N/A Debridement A (sq cm): rea Curette N/A N/A Instrument: Minimum N/A N/A Bleeding: Pressure N/A  N/A Hemostasis Achieved: Debridement [name] Response: Procedure was tolerated well N/A N/A Post Debridement Measurements L x 1x0.5x0.1 N/A N/A W x D (cm) 0.039 N/A N/A Post Debridement Volume: (cm) Callus: Yes N/A N/A Periwound Skin Texture: Debridement N/A N/A Procedures Performed: [name] Notes Electronic Signature(s) Signed: 01/06/2024 11:28:56 AM By: Glenn Nest MD FACS Entered By: [name] on 01/06/2024 08:28:56 -------------------------------------------------------------------------------- Multi-Disciplinary Care Plan Details Patient Name: Date of Service: [name], [name] 01/06/2024 10:45 A M Medical Record Number: [id number] Patient Account Number: [id number] Date of Birth/Sex: Treating [name]: March 17, 1971 (52 y.o. [name] Primary Care Teiara Baria: Brien Dover Other Clinician: Referring Corinna Burkman: Treating Kathaleen Dudziak/Extender: [name] Brien Dover Devra in [name]: [address], [place] ([id number]) [id number] Page 4 of 7 Active Inactive Pressure Nursing Diagnoses: Knowledge deficit related to causes and risk factors for pressure ulcer development Potential for impaired tissue integrity related to pressure, friction, moisture, and shear Goals: Patient/caregiver will verbalize understanding of pressure ulcer management Date Initiated: 12/22/2023 Target Resolution Date: 02/27/2024 Goal Status: Active Interventions: Assess offloading mechanisms upon admission and as needed Assess potential for pressure ulcer upon admission and as needed Provide education on pressure ulcers Notes: Electronic Signature(s) Signed: 01/06/2024 12:30:54 PM By: [name] Entered By: [name] on 01/06/2024 09:29:10 -------------------------------------------------------------------------------- Pain Assessment Details Patient Name: Date of Service: [name], [name] 01/06/2024 10:45 A M Medical Record Number:  [id number] Patient Account Number: [id number] Date of Birth/Sex: Treating [name]: August 28, 1971 (52 y.o. M) Primary Care Temprance Wyre: Brien Dover Other Clinician: Referring Jameil Whitmoyer: Treating Laurinda Carreno/Extender: [name] Brien Dover Devra in [name]: 70 Active Problems Location of Pain Severity and Description of Pain Patient Has Paino No Site Locations Pain Management and Medication Current Pain Management: Electronic Signature(s) Signed: 01/06/2024 11:10:02 AM By: Okey Bonier Entered By: Okey Bonier on 01/06/2024 07:42:24 RANA BARTERS ([id number]) [id number] Page 5 of 7 -------------------------------------------------------------------------------- Patient/Caregiver Education Details Patient Name: Date of Service: [name] [place], [name] RO LD 1/7/2025andnbsp10:45 A M Medical Record Number: [id number] Patient Account Number: [id number] Date of Birth/Gender: Treating [name]: 12-Sep-1971 (53 y.o. [name] Primary Care Physician: Brien Dover Other Clinician: Referring Physician: Treating Physician/Extender: [name] Brien Dover Devra in [name]: 42 Education Assessment Education Provided To: Patient Education Topics Provided Wound/Skin Impairment: Methods: Explain/Verbal Responses: State content correctly Electronic Signature(s) Signed: 01/06/2024 12:30:54 PM By: [name] Entered By: [name] on 01/06/2024 09:29:25 -------------------------------------------------------------------------------- Wound Assessment Details Patient Name: Date of Service: [name], [name] 01/06/2024 10:45 A  M Medical Record Number: [id number] Patient Account Number: [id number] Date of Birth/Sex: Treating [name]: April 24, 1971 (53 y.o. M) Primary Care Leanette Eutsler: Brien Dover Other Clinician: Referring Verbie Babic: Treating Rovena Hearld/Extender: [name]: 49 Wound Status Wound Number: 4 Primary  Diabetic Wound/Ulcer of the Lower Extremity Etiology: Wound Location: Left, Lateral Foot Wound Open Wounding Event: Pressure Injury Status: Date Acquired: 12/22/2023 Comorbid Chronic sinus problems/congestion, Congestive Heart Failure, Weeks Of [name]: 2 History: Coronary Artery Disease, Hypertension, Myocardial Infarction, Type Clustered Wound: No II Diabetes, Neuropathy Photos Wound Measurements [name], [name] ([id number]) Length: (cm) 0.1 Width: (cm) 0.1 Depth: (cm) 0.1 Area: (cm) 0.008 Volume: (cm) 0.001 [id number] Page 6 of 7 % Reduction in Area: 95.9% % Reduction in Volume: 95% Epithelialization: Large (67-100%) Tunneling: No Undermining: No Wound Description Classification: Grade 2 Wound Margin: Distinct, outline attached Exudate Amount: Medium Exudate Type: Serosanguineous Exudate Color: red, brown Foul Odor After Cleansing: No Slough/Fibrino Yes Wound Bed Granulation Amount: None Present (0%) Exposed Structure Necrotic Amount: Large (67-100%) Fascia Exposed: No Necrotic Quality: Eschar, Adherent Slough Fat Layer (Subcutaneous Tissue) Exposed: Yes Tendon Exposed: No Muscle Exposed: No Joint Exposed: No Bone Exposed: No Periwound Skin Texture Texture Color No Abnormalities Noted: No No Abnormalities Noted: No Callus: Yes Moisture No Abnormalities Noted: No [name] Notes Wound #4 (Foot) Wound Laterality: Left, Lateral Cleanser Soap and Water Discharge Instruction: May shower and wash wound with dial  antibacterial soap and water prior to dressing change. Peri-Wound Care Topical Primary Dressing Optifoam Non-Adhesive Dressing, 4x4 in Discharge Instruction: Apply to wound bed as instructed Promogran Prisma Matrix, 4.34 (sq in) (silver  collagen) Discharge Instruction: Moisten collagen with saline or hydrogel Secondary Dressing Woven Gauze Sponge, Non-Sterile 4x4 in Discharge Instruction: Apply over primary dressing as  directed. Secured With Elastic Bandage 4 inch (ACE bandage) Discharge Instruction: Secure with ACE bandage as directed. 43M Medipore H Soft Cloth Surgical T ape, 4 x 10 (in/yd) Discharge Instruction: Secure with tape as directed. Compression Wrap Compression Stockings Add-Ons Electronic Signature(s) Signed: 01/06/2024 12:30:54 PM By: [name] Entered By: [name] on 01/06/2024 08:01:27 [name], Glenn ([id number]) [id number] Page 7 of 7 -------------------------------------------------------------------------------- Vitals Details Patient Name: Date of Service: [name], [name] RO LD 01/06/2024 10:45 A M Medical Record Number: [id number] Patient Account Number: [id number] Date of Birth/Sex: Treating [name]: 1971-06-27 (53 y.o. M) Primary Care Carolanne Mercier: Brien Dover Other Clinician: Referring Miriah Maruyama: Treating Chijioke Lasser/Extender: [name]: 49 Vital Signs Time Taken: 10:41 Temperature (F): 97.5 Height (in): 78 Pulse (bpm): 82 Weight (lbs): 275 Respiratory Rate (breaths/min): 20 Body Mass Index (BMI): 31.8 Blood Pressure (mmHg): 111/79 Reference Range: 80 - 120 mg / dl Electronic Signature(s) Signed: 01/06/2024 11:10:02 AM By: Okey Bonier Entered By: Okey Bonier on 01/06/2024 07:42:17

## 2024-01-12 ENCOUNTER — Other Ambulatory Visit: Payer: Self-pay

## 2024-01-12 ENCOUNTER — Other Ambulatory Visit (HOSPITAL_COMMUNITY): Payer: Self-pay

## 2024-01-13 ENCOUNTER — Other Ambulatory Visit: Payer: Self-pay

## 2024-01-14 ENCOUNTER — Other Ambulatory Visit: Payer: Self-pay

## 2024-01-14 ENCOUNTER — Encounter (HOSPITAL_BASED_OUTPATIENT_CLINIC_OR_DEPARTMENT_OTHER): Payer: 59 | Admitting: General Surgery

## 2024-01-14 DIAGNOSIS — E1122 Type 2 diabetes mellitus with diabetic chronic kidney disease: Secondary | ICD-10-CM | POA: Diagnosis not present

## 2024-01-14 DIAGNOSIS — I2510 Atherosclerotic heart disease of native coronary artery without angina pectoris: Secondary | ICD-10-CM | POA: Diagnosis not present

## 2024-01-14 DIAGNOSIS — L97522 Non-pressure chronic ulcer of other part of left foot with fat layer exposed: Secondary | ICD-10-CM | POA: Diagnosis not present

## 2024-01-14 DIAGNOSIS — Z955 Presence of coronary angioplasty implant and graft: Secondary | ICD-10-CM | POA: Diagnosis not present

## 2024-01-14 DIAGNOSIS — I5042 Chronic combined systolic (congestive) and diastolic (congestive) heart failure: Secondary | ICD-10-CM | POA: Diagnosis not present

## 2024-01-14 DIAGNOSIS — L84 Corns and callosities: Secondary | ICD-10-CM | POA: Diagnosis not present

## 2024-01-14 DIAGNOSIS — I130 Hypertensive heart and chronic kidney disease with heart failure and stage 1 through stage 4 chronic kidney disease, or unspecified chronic kidney disease: Secondary | ICD-10-CM | POA: Diagnosis not present

## 2024-01-14 DIAGNOSIS — N189 Chronic kidney disease, unspecified: Secondary | ICD-10-CM | POA: Diagnosis not present

## 2024-01-14 DIAGNOSIS — Z89422 Acquired absence of other left toe(s): Secondary | ICD-10-CM | POA: Diagnosis not present

## 2024-01-14 DIAGNOSIS — I252 Old myocardial infarction: Secondary | ICD-10-CM | POA: Diagnosis not present

## 2024-01-14 DIAGNOSIS — E119 Type 2 diabetes mellitus without complications: Secondary | ICD-10-CM | POA: Diagnosis not present

## 2024-01-14 DIAGNOSIS — E11621 Type 2 diabetes mellitus with foot ulcer: Secondary | ICD-10-CM | POA: Diagnosis not present

## 2024-01-14 DIAGNOSIS — Z794 Long term (current) use of insulin: Secondary | ICD-10-CM | POA: Diagnosis not present

## 2024-01-15 ENCOUNTER — Other Ambulatory Visit: Payer: Self-pay

## 2024-01-15 ENCOUNTER — Telehealth: Payer: Self-pay | Admitting: Critical Care Medicine

## 2024-01-15 NOTE — Progress Notes (Signed)
Glenn, [name] ([id number]) [id number] Page 1 of 8 Visit Report for 01/14/2024 Arrival Information Details Patient Name: Date of Service: [name] [name], [name] [name] 01/14/2024 7:30 A M Medical Record Number: [id number] Patient Account Number: [id number] Date of Birth/Sex: Treating RN: 03/19/1971 (53 y.o. Damaris Schooner Primary Care Emmette Katt: [name] Other Clinician: Referring Travis Purk: Treating Adell Koval/Extender: [name] in Treatment: 50 Visit Information History Since Last Visit Added or deleted any medications: No Patient Arrived: Ambulatory Any new allergies or adverse reactions: No Arrival Time: 07:41 Had a fall or experienced change in No Accompanied By: self activities of daily living that may affect Transfer Assistance: None risk of falls: Patient Identification Verified: Yes Signs or symptoms of abuse/neglect since No Secondary Verification Process Completed: Yes last visito Patient Requires Transmission-Based Precautions: No Hospitalized since last visit: No Patient Has Alerts: Yes Implantable device outside of the clinic No Patient Alerts: 08/2021 ABI:1.2 TBI 0.79 excluding cellular tissue based products placed in the center since last visit: Has Dressing in Place as Prescribed: No Has Footwear/Offloading in Place as Yes Prescribed: Left: Surgical Shoe with Pressure Relief Insole Pain Present Now: No Electronic Signature(s) Signed: 01/14/2024 5:36:28 PM By: Zenaida Deed RN, BSN Entered By: Zenaida Deed on 01/14/2024 07:42:09 -------------------------------------------------------------------------------- Clinic Level of Care Assessment Details Patient Name: Date of Service: [name] IN, [name] [name] 01/14/2024 7:30 A M Medical Record Number: [id number] Patient Account Number: [id number] Date of Birth/Sex: Treating RN: [birth year] (52 y.o. Damaris Schooner Primary Care Keifer Habib: [name] Other  Clinician: Referring Mariaguadalupe Fialkowski: Treating Dion Sibal/Extender: [name] in Treatment: 50 Clinic Level of Care Assessment Items TOOL 4 Quantity Score []  - 0 Use when only an EandM is performed on FOLLOW-UP visit ASSESSMENTS - Nursing Assessment / Reassessment X- 1 10 Reassessment of Co-morbidities (includes updates in patient status) X- 1 5 Reassessment of Adherence to Treatment Plan ASSESSMENTS - Wound and Skin A ssessment / Reassessment X - Simple Wound Assessment / Reassessment - one wound 1 5 []  - 0 Complex Wound Assessment / Reassessment - multiple wounds []  - 0 Dermatologic / Skin Assessment (not related to wound area) [name] ([id number]) [id number] Page 2 of 8 ASSESSMENTS - Focused Assessment []  - 0 Circumferential Edema Measurements - multi extremities []  - 0 Nutritional Assessment / Counseling / Intervention X- 1 5 Lower Extremity Assessment (monofilament, tuning fork, pulses) []  - 0 Peripheral Arterial Disease Assessment (using hand held doppler) ASSESSMENTS - Ostomy and/or Continence Assessment and Care []  - 0 Incontinence Assessment and Management []  - 0 Ostomy Care Assessment and Management (repouching, etc.) PROCESS - Coordination of Care X - Simple Patient / Family Education for ongoing care 1 15 []  - 0 Complex (extensive) Patient / Family Education for ongoing care X- 1 10 Staff obtains Chiropractor, Records, T Results / Process Orders est []  - 0 Staff telephones HHA, Nursing Homes / Clarify orders / etc []  - 0 Routine Transfer to another Facility (non-emergent condition) []  - 0 Routine Hospital Admission (non-emergent condition) []  - 0 New Admissions / Manufacturing engineer / Ordering NPWT Apligraf, etc. , []  - 0 Emergency Hospital Admission (emergent condition) X- 1 10 Simple Discharge Coordination []  - 0 Complex (extensive) Discharge Coordination PROCESS - Special Needs []  -  0 Pediatric / Minor Patient Management []  - 0 Isolation Patient Management []  - 0 Hearing / Language / Visual special needs []  - 0 Assessment of Community assistance (transportation, D/C planning, etc.) []  - 0 Additional assistance /  Altered mentation []  - 0 Support Surface(s) Assessment (bed, cushion, seat, etc.) INTERVENTIONS - Wound Cleansing / Measurement X - Simple Wound Cleansing - one wound 1 5 []  - 0 Complex Wound Cleansing - multiple wounds X- 1 5 Wound Imaging (photographs - any number of wounds) []  - 0 Wound Tracing (instead of photographs) []  - 0 Simple Wound Measurement - one wound []  - 0 Complex Wound Measurement - multiple wounds INTERVENTIONS - Wound Dressings X - Small Wound Dressing one or multiple wounds 1 10 []  - 0 Medium Wound Dressing one or multiple wounds []  - 0 Large Wound Dressing one or multiple wounds []  - 0 Application of Medications - topical []  - 0 Application of Medications - injection INTERVENTIONS - Miscellaneous []  - 0 External ear exam []  - 0 Specimen Collection (cultures, biopsies, blood, body fluids, etc.) []  - 0 Specimen(s) / Culture(s) sent or taken to Lab for analysis []  - 0 Patient Transfer (multiple staff / Michiel Sites Lift / Similar devices) []  - 0 Simple Staple / Suture removal (25 or less) [name], [name] ([id number]) [phone].pdf Page 3 of 8 []  - 0 Complex Staple / Suture removal (26 or more) []  - 0 Hypo / Hyperglycemic Management (close monitor of Blood Glucose) []  - 0 Ankle / Brachial Index (ABI) - do not check if billed separately X- 1 5 Vital Signs Has the patient been seen at the hospital within the last three years: Yes Total Score: 85 Level Of Care: New/Established - Level 3 Electronic Signature(s) Signed: 01/14/2024 5:36:28 PM By: Zenaida Deed RN, BSN Entered By: Zenaida Deed on 01/14/2024  07:57:31 -------------------------------------------------------------------------------- Encounter Discharge Information Details Patient Name: Date of Service: [name] IN, HA [name] 01/14/2024 7:30 A M Medical Record Number: [id number] Patient Account Number: [id number] Date of Birth/Sex: Treating RN: 07-19-1971 (52 y.o. Damaris Schooner Primary Care Clydette Privitera: [name] Other Clinician: Referring Maresa Morash: Treating Mandeep Ferch/Extender: [name] in Treatment: 50 Encounter Discharge Information Items Discharge Condition: Stable Ambulatory Status: Ambulatory Discharge Destination: Home Transportation: Private Auto Accompanied By: self Schedule Follow-up Appointment: Yes Clinical Summary of Care: Patient Declined Electronic Signature(s) Signed: 01/14/2024 5:36:28 PM By: Zenaida Deed RN, BSN Entered By: Zenaida Deed on 01/14/2024 08:01:56 -------------------------------------------------------------------------------- Lower Extremity Assessment Details Patient Name: Date of Service: [name] IN, HA [name] 01/14/2024 7:30 A M Medical Record Number: [id number] Patient Account Number: [id number] Date of Birth/Sex: Treating RN: Jan 08, 1971 (52 y.o. Damaris Schooner Primary Care Qusai Kem: [name] Other Clinician: Referring Mala Gibbard: Treating Kaitland Lewellyn/Extender: [name] in Treatment: 50 Edema Assessment Assessed: [name]: No] [name]: No] Edema: [Left: N] [Right: o] Calf Left: Right: [name] of Measurement: 43 cm From Medial Instep 37 cm Ankle Left: Right: [name], [name] ([id number]) [phone].pdf Page 4 of 8 [name] of Measurement: 9 cm From Medial Instep 21.3 cm Vascular Assessment Pulses: Dorsalis Pedis Palpable: [Left:Yes] Extremity colors, hair growth, and conditions: Extremity Color: [Left:Normal] Hair Growth on Extremity: [Left:Yes] Temperature of Extremity:  [Left:Warm] Capillary Refill: [Left:< 3 seconds] Dependent Rubor: [Left:No No] Electronic Signature(s) Signed: 01/14/2024 5:36:28 PM By: Zenaida Deed RN, BSN Entered By: Zenaida Deed on 01/14/2024 07:43:09 -------------------------------------------------------------------------------- Multi Wound Chart Details Patient Name: Date of Service: [name] IN, HA [name] 01/14/2024 7:30 A M Medical Record Number: [id number] Patient Account Number: [id number] Date of Birth/Sex: Treating RN: 12-05-1971 (52 y.o. M) Primary Care Janyra Barillas: [name] Other Clinician: Referring Gorge Almanza: Treating Morio Widen/Extender: [name] in Treatment: 50 Vital Signs Height(in): 78 Pulse(bpm): 78  Weight(lbs): 275 Blood Pressure(mmHg): 114/82 Body Mass Index(BMI): 31.8 Temperature(F): 97.9 Respiratory Rate(breaths/min): 18 [4:Photos:] [N/A:N/A] Left, Lateral Foot N/A N/A Wound Location: Pressure Injury N/A N/A Wounding Event: Diabetic Wound/Ulcer of the Lower N/A N/A Primary Etiology: Extremity Chronic sinus problems/congestion, N/A N/A Comorbid History: Congestive Heart Failure, Coronary Artery Disease, Hypertension, Myocardial Infarction, Type II Diabetes, Neuropathy 12/22/2023 N/A N/A Date Acquired: 3 N/A N/A Weeks of Treatment: Open N/A N/A Wound Status: No N/A N/A Wound Recurrence: 0x0x0 N/A N/A Measurements L x W x D (cm) 0 N/A N/A A (cm) : rea 0 N/A N/A Volume (cm) : 100.00% N/A N/A % Reduction in A rea: 100.00% N/A N/A % Reduction in Volume: Grade 2 N/A N/A Classification: None Present N/A N/A Exudate A mount: None Present (0%) N/A N/A Granulation A mount: None Present (0%) N/A N/A Necrotic A mountTORRIANO, [name] ([id number]) [phone].pdf Page 5 of 8 Fascia: No N/A N/A Exposed Structures: Fat Layer (Subcutaneous Tissue): No Tendon: No Muscle: No Joint: No Bone: No Large (67-100%) N/A  N/A Epithelialization: Callus: Yes N/A N/A Periwound Skin Texture: No Abnormalities Noted N/A N/A Periwound Skin Moisture: No Abnormalities Noted N/A N/A Periwound Skin Color: No Abnormality N/A N/A Temperature: Treatment Notes Electronic Signature(s) Signed: 01/14/2024 7:53:45 AM By: [name] MD FACS Entered By: [name] on 01/14/2024 07:53:45 -------------------------------------------------------------------------------- Multi-Disciplinary Care Plan Details Patient Name: Date of Service: [name] IN, HA [name] 01/14/2024 7:30 A M Medical Record Number: [id number] Patient Account Number: [id number] Date of Birth/Sex: Treating RN: 1971/11/07 (52 y.o. Damaris Schooner Primary Care Reisha Wos: [name] Other Clinician: Referring Ayat Drenning: Treating Earma Nicolaou/Extender: [name] in Treatment: 50 Multidisciplinary Care Plan reviewed with physician Active Inactive Electronic Signature(s) Signed: 01/14/2024 5:36:28 PM By: Zenaida Deed RN, BSN Entered By: Zenaida Deed on 01/14/2024 07:47:25 -------------------------------------------------------------------------------- Pain Assessment Details Patient Name: Date of Service: [name] IN, HA [name] 01/14/2024 7:30 A M Medical Record Number: [id number] Patient Account Number: [id number] Date of Birth/Sex: Treating RN: 11/02/1971 (52 y.o. Damaris Schooner Primary Care Yanice Maqueda: [name] Other Clinician: Referring Jillian Warth: Treating Clifford Benninger/Extender: [name] in Treatment: 50 Active Problems Location of Pain Severity and Description of Pain Patient Has Paino No Site Locations Rate the pain. [name], [name] ([id number]) [id number] Page 6 of 8 Rate the pain. Current Pain Level: 0 Pain Management and Medication Current Pain Management: Electronic Signature(s) Signed: 01/14/2024 5:36:28 PM By: Zenaida Deed RN,  BSN Entered By: Zenaida Deed on 01/14/2024 07:42:42 -------------------------------------------------------------------------------- Patient/Caregiver Education Details Patient Name: Date of Service: [name] IN, HA [name] 1/15/2025andnbsp7:30 A M Medical Record Number: [id number] Patient Account Number: [id number] Date of Birth/Gender: Treating RN: 08-29-1971 (53 y.o. Damaris Schooner Primary Care Physician: [name] Other Clinician: Referring Physician: Treating Physician/Extender: [name] in Treatment: 50 Education Assessment Education Provided To: Patient Education Topics Provided Offloading: Methods: Explain/Verbal Responses: Reinforcements needed, State content correctly Wound/Skin Impairment: Methods: Explain/Verbal Responses: Reinforcements needed, State content correctly Electronic Signature(s) Signed: 01/14/2024 5:36:28 PM By: Zenaida Deed RN, BSN Entered By: Zenaida Deed on 01/14/2024 07:47:55 -------------------------------------------------------------------------------- Wound Assessment Details Patient Name: Date of Service: [name] IN, HA [name] 01/14/2024 7:30 A [name], [name] ([id number]) [id number] Page 7 of 8 Medical Record Number: [id number] Patient Account Number: [id number] Date of Birth/Sex: Treating RN: Jan 16, 1971 (53 y.o. Damaris Schooner Primary Care Jesiah Yerby: [name] Other Clinician: Referring Ethridge Sollenberger: Treating Tavaughn Silguero/Extender: [name] in Treatment: 50 Wound Status Wound Number: 4  Primary Diabetic Wound/Ulcer of the Lower Extremity Etiology: Wound Location: Left, Lateral Foot Wound Open Wounding Event: Pressure Injury Status: Date Acquired: 12/22/2023 Comorbid Chronic sinus problems/congestion, Congestive Heart Failure, Weeks Of Treatment: 3 History: Coronary Artery Disease, Hypertension, Myocardial Infarction, Type Clustered  Wound: No II Diabetes, Neuropathy Photos Wound Measurements Length: (cm) Width: (cm) Depth: (cm) Area: (cm) Volume: (cm) 0 % Reduction in Area: 100% 0 % Reduction in Volume: 100% 0 Epithelialization: Large (67-100%) 0 Tunneling: No 0 Undermining: No Wound Description Classification: Grade 2 Exudate Amount: None Present Foul Odor After Cleansing: No Slough/Fibrino No Wound Bed Granulation Amount: None Present (0%) Exposed Structure Necrotic Amount: None Present (0%) Fascia Exposed: No Fat Layer (Subcutaneous Tissue) Exposed: No Tendon Exposed: No Muscle Exposed: No Joint Exposed: No Bone Exposed: No Periwound Skin Texture Texture Color No Abnormalities Noted: No No Abnormalities Noted: Yes Callus: Yes Temperature / Pain Temperature: No Abnormality Moisture No Abnormalities Noted: Yes Electronic Signature(s) Signed: 01/14/2024 5:36:28 PM By: Zenaida Deed RN, BSN Entered By: Zenaida Deed on 01/14/2024 07:46:42 -------------------------------------------------------------------------------- Vitals Details Patient Name: Date of Service: [name] IN, HA [name] 01/14/2024 7:30 A M Medical Record Number: [id number] Patient Account Number: [id number] [name], [name] ([id number]) [phone].pdf Page 8 of 8 Date of Birth/Sex: Treating RN: Sep 23, 1971 (52 y.o. Damaris Schooner Primary Care Ricke Kimoto: Other Clinician: [name] Referring Chavela Justiniano: Treating Zeynep Fantroy/Extender: [name] in Treatment: 50 Vital Signs Time Taken: 07:42 Temperature (F): 97.9 Height (in): 78 Pulse (bpm): 78 Weight (lbs): 275 Respiratory Rate (breaths/min): 18 Body Mass Index (BMI): 31.8 Blood Pressure (mmHg): 114/82 Reference Range: 80 - 120 mg / dl Electronic Signature(s) Signed: 01/14/2024 5:36:28 PM By: Zenaida Deed RN, BSN Entered By: Zenaida Deed on 01/14/2024 07:42:32

## 2024-01-15 NOTE — Telephone Encounter (Signed)
Copied from CRM [id number]. Topic: General - Other >> Jan 15, 2024  8:39 AM Franchot Heidelberg wrote: Reason for CRM: Pt called reporting that in order to get his shoes and inserts something has been faxed to the office on the 14th. He wants to get this signed and faxed back prior to his insurance changing.   This is coming from [employer] and [employer].  Best contact: [phone]

## 2024-01-15 NOTE — Progress Notes (Signed)
[name], [name] ([id number]) [id number] Page 1 of 8 Visit Report for 01/14/2024 Chief Complaint Document Details Patient Name: Date of Service: [name] [place], [name] RO LD 01/14/2024 7:30 A M Medical Record Number: [id number] Patient Account Number: [id number] Date of Birth/Sex: Treating RN: 08/14/1971 (53 y.o. M) Primary Care Provider: [name] Other Clinician: Referring Provider: Treating Provider/Extender: [name] in Treatment: 50 Information Obtained from: Patient Chief Complaint 01/15/2023; left foot wound Electronic Signature(s) Signed: 01/14/2024 7:54:21 AM By: [name] MD FACS Entered By: [name] on 01/14/2024 07:54:21 -------------------------------------------------------------------------------- HPI Details Patient Name: Date of Service: [name] [place], [name] 01/14/2024 7:30 A M Medical Record Number: [id number] Patient Account Number: [id number] Date of Birth/Sex: Treating RN: 1971-08-01 (52 y.o. M) Primary Care Provider: [name] Other Clinician: Referring Provider: Treating Provider/Extender: [name] in Treatment: 50 History of Present Illness HPI Description: ADMISSION 01/11/2020 This is a 53 year old man with uncontrolled type 2 diabetes with a recent hemoglobin A1c earlier this year of 13.4. He is on insulin and glipizide. He does not take his blood sugars at home. He does have a follow-up with primary care later this month I believe on January 27. He tells [place] that roughly a month ago he was walking with a shoe with a hole in his foot. He took the shoe off and there was an open wound at roughly the left fourth met head. This has significant undermining and raised edges. He has not noticed any purulence he does not feel unwell. More recently he was taking skin off the bottom of his foot and has a superficial area on the left fifth met head. He has not been  offloading this. The patient was in the ER on 12/20. They gave him Bactroban which she has been using on the wound and 10 days worth of doxycycline. No x-rays were done. He has not had vascular studies. He is also been using hydrogen peroxide. Past medical history type 2 diabetes uncontrolled, chronic systolic heart failure, coronary artery disease with a history of congestive heart failure with stents. Hypertension hyperlipidemia and chronic renal insufficiency ABI in this clinic was 1.14 on the left. 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 He is uncertain whether he would be able to work if we put him on some form of restriction 1/19; he is generally doing quite well. Using silver alginate on the wounds. Things actually look better. He has a forefoot offloading boot which she seems to be compliant about. He has support at work to stay off his foot is much as possible which is gratifying. Culture I did last week showed a few Enterococcus faecalis. I am going to put him on Augmentin. I talked about ordering an x-ray in my note last week but that does not seem to have happened. We will review reorder the x-ray this week. 1/26; x-ray reordered last week was negative for osteomyelitis. We are using silver alginate on the wound on the third and fifth met heads on the left. He is using a Darco forefoot off loader 2/2; the area on the fifth met head is closed. Third met head is still open with tunneling depth and thick callus. 2/9; the area on the fifth met head remains closed however the third met head again has a small open area on presentation with tunneling in depth and surrounded by thick callus. This looks like a pressure issue. We have been using silver  alginate 2/16; the area of the fifth metatarsal head remains closed however the area over the third metatarsal head again is a small open area but with some depth. I do not think this is changed much since last week. He  is using Hydrofera Blue with forefoot off loader. He is not able to use a total contact cast on the left leg HARPER, KISE ([id number]) [id number] Page 2 of 8 because he needs his left leg at work [employer]]. Fortunately the wound does not look infected. I changed him to endoform today 2/26; the area of the fifth metatarsal head remains closed. The area of the third metatarsal head has an even smaller opening this time. I used endoform on this this looks improved. He is offloading this is much as he can and a forefoot off loader on the left. He cannot have a total contact cast because of work responsibilities 3/5; the area on the fifth metatarsal head remains closed the area on the third metatarsal head is also closed on the left foot. 01/15/2023 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 He has been following for podiatry for several months for this issue. He is currently using wet-to-dry saline dressings. He currently denies signs of infection. Progresse note from 1/17; Patient presents with a 79-month history of nonhealing ulcer to the left foot secondary to diabetes and inability to offload well the area. He has had multiple debridements in the past to his feet bilaterally. He has had resection of the fourth left met head in the fifth toe Secondary to osteomyelitis. We discussed the importance of glycemic control for wound healing. Due to his blood glucose levels being elevated he is at high risk for infection and thus further amputation. He expressed understanding. He states he is supposed to be referred to an  endocrinologist at [facility] however the referral fell through. Unclear what happened. Offered a referral to endocrinology at [facility]. Patient was agreeable.Furthermore we discussed the importance of aggressive offloading for his wound healing. This will be the most difficult part of the treatment plan for the patient to do. We discussed a total contact cast however he has declined that at this time. 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 He is currently using a surgical shoe with a peg assist. It does not fit well so we will give him a new one today. I do not think this is enough offloading. If he is not able to offload this area he will likely end up with a BKA. He is well aware of this. For now I recommended Medihoney and Hydrofera Blue for dressing changes. He will follow-up in our [place] office Since this is closer for him. 1/26; patient presents for follow-up. Last clinic visit I had seen him in [place] and we transferred him to [place] since this is a closer location for him. Progress note above from that visit. Over the past week He has been using Medihoney and Hydrofera Blue to the wound bed. He has been using his surgical shoe with peg assist. He has no issues or complaints today.  We discussed doing the total contact cast and he was agreeable to having this placed at next clinic visit. 2/2; patient presents for follow-up. He has been using Vashe wet-to-dry dressings. Plan is for the total contact cast today. 2/5; patient presents for follow-up. The cast was placed 3 days ago. He tolerated this well however had a lot of drainage. He currently denies signs of infection. 2/9; patient presents for follow-up. At last clinic visit we held off on replacing the cast due to drainage. He has been using Vashe wet-to-dry dressings and he is currently taking the oral antibiotics prescribed without issues. He declines a total  contact cast today. We ran insurance verification for skin substitute and due to cost patient declines having this placed. 2/16; patient presents for follow-up. He has decided not to follow-up with podiatry. He continues to decline the total contact cast. He has been using Vashe wet- to-dry dressings. He has not been wearing his surgical shoe with peg assist. He currently denies signs of infection. 3/1; patient presents for follow-up. He has been doing Vashe wet-to-dry dressings. He has a surgical shoe with peg assist. We discussed potentially doing a skin substitute for which she has been approved for by insurance. He knows the out-of-pocket cost of this and would still like to proceed with having this placed today. He denies the current total contact cast. 3/8; patient presents for follow-up. He states that the skin substitute came off after a few days and he has been using Vashe wet-to-dry dressings. He has mild odor to the wound bed on exam. 3/15; patient presents for follow-up. He has been using Vashe wet-to-dry dressings and started his course of Augmentin. He reports improvement in wound healing. He has no issues or complaints today. 3/21; patient presents for follow-up. He continues to use Vashe wet-to-dry dressings. He has no issues or complaints today. 3/28; patient presents for follow-up. He has been using Vashe wet-to-dry dressings. He has no issues or complaints today. He has been using his peg assist with surgical shoe. 4/11; patient presents for follow-up. He has been using Vashe wet-to-dry dressings. The periwound is macerated. He has been using his peg assist with surgical shoe. He has no issues or complaints today. 4/25; patient presents for follow-up. Has been using Vashe wet-to-dry dressings. He has been using zinc oxide to the periwound. For offloading he has been using his peg assist in surgical shoe. He has no issues or complaints today. 5/9; patient presents for follow-up.  He has been using Vashe wet-to-dry dressings. He has no issues or complaints today. He has a surgical shoe with peg assist for offloading. 5/23; patient presents for follow up. He has been using Vashe to clean the wound bed and collagen and blast X with dressing changes. [place] went to the beach [place] 2 [place]. Wound is bigger. 5/31; patient presents for follow-up. He has been using Vashe wet-to-dry dressings. Wound is stable but with healthier granulation tissue. Patient would like to hold off on the total contact cast and try a knee scooter for offloading. 6/13; patient presents for follow-up. He has been using Vashe wet-to-dry dressings. He has been using a knee scooter for offloading however states he felt unstable on this and decided to buy an electric scooter. This arrived in the mail today. He would like to hold off on the left total contact cast. Wound is slightly smaller today. 6/27; patient presents for follow-up. He has been using Vashe wet-to-dry dressings. He is using an Art gallery manager  for mobility to help relieve the pressure off the wound bed. Wound is smaller today. He wants to hold off on doing the total contact cast as he is showing signs of improvement with wound healing. 7/11; patient presents for follow-up. He has been using Vashe wet-to-dry dressings and his electric scooter for offloading. Wound is slightly smaller. 7/25; patient presents for follow up. He has been using Vashe wet-to-dry dressings and he reports using his electric scooter for offloading. Wound is larger. 7/30; patient presents for follow-up. He has been using Vashe wet-to-dry dressings. He has taken Augmentin as prescribed without issues. Overall wound has healthier granulation tissue. 8/1; patient presents for follow-up. He presents for obligatory cast change. He tolerated the cast well and has no issues or complaints today. We have been using antibiotic ointment with Hydrofera Blue under the cast. Wound is  slightly smaller. 8/8; patient presents for follow-up. We have been using antibiotic ointment with Hydrofera Blue under the total contact cast. Wound is smaller. He has no issues or complaints today. 8/13; patient presents for follow-up. We have been using antibiotic ointment with Hydrofera Blue under the total contact cast. He comes in today because the top part of the cast over the foot has sunken as well as on the side. He wanted to assure that there were no issues. MACALEB, SWINEA ([id number]) [id number] Page 3 of 8 8/22; patient presents for follow-up. He has been using Hydrofera Blue to the wound bed. Previously we have been using a total contact cast. We do not have total contact casts due to manufacturing issues available for patients. He has no issues or complaints. He denies signs of infection. 8/27; patient presents for follow-up. He has been using Vashe wet-to-dry dressings. T contact cast is available for placement today. He was agreeable to otal proceed with this. He denies signs of infection. 9/3; patient presents for follow-up. We have been using Hydrofera Blue with antibiotic ointment under the total contact cast for the past week. He has tolerated this well. The wound is smaller. Unfortunately we do not have a total contact cast in office today for replacement as there is been manufacturing issues. 9/5; patient was seen earlier this week and plan was for a total contact cast but there is none available. The cast finally arrived in patient has come in today to have this placed. We will continue with Hydrofera Blue and antibiotic ointment under the cast. 9/12; patient presents for follow-up. We have been using Hydrofera Blue and antibiotic ointment under the cast. Wound is stable. He denies signs of infection. 09/17/2023: The wound is measuring smaller today. The surface appears healthy and there is minimal accumulation of slough. 09/23/2023: The wound  external measurements remain about the same, but it is shallower and visually appears smaller. 09/30/2023: The wound is covered with a layer of callus. Underneath the callus, there is a tiny superficial residual opening. 10/14/2023: He returns after being away for 2 weeks, during which he was out of a cast. Not surprisingly, his wound has reopened substantially. It remains clean, however. 10/21/2023: We have recovered most of what was lost as far as progress with his absence from clinic. The wound did measure smaller today. It is a little bit macerated around the edges and he reports that he thinks some water got in over the top of his cast while showering. 10/28/2023: The wound measures smaller today. There is no tissue maceration. There is some slough on the surface and a little bit of callus  accumulation around the edges. 11/04/2023: The wound measured a little bit deeper today and had some undermining, but this seems to be due to mainly to accumulated callus. The surface has a bit of slough. He has been approved for Grafix so we will plan to apply that today. 11/10/2023: He has responded beautifully to Grafix application. His wound is down to just a couple of millimeters. There is some slough on the surface. 11/18/2023: His wound is freshly healed. 11/25/2023: After an additional week in the cast, the skin of the healed surface has greater strength and integrity. 12/22/2023: The patient contacted the clinic due to concern that his wound had reopened. He noticed some darkening underneath the callus that had built up and then then a portion of the callus fell off. There was still a dark central area, concerning for wound reopening. 01/06/2024: He is here for recheck of the wound site. The callus has built up quite extensively and there is some discoloration underneath. Once the callus was debrided, there were 2 very tiny openings, about a millimeter each. No malodor or purulent drainage. No other  concern for infection. 01/14/2024: The wounds are healed. Electronic Signature(s) Signed: 01/14/2024 7:55:49 AM By: [name] MD FACS Entered By: [name] on 01/14/2024 07:55:49 -------------------------------------------------------------------------------- Physical Exam Details Patient Name: Date of Service: [name], [name] 01/14/2024 7:30 A M Medical Record Number: [id number] Patient Account Number: [id number] Date of Birth/Sex: Treating RN: 1971-07-08 (52 y.o. M) Primary Care Provider: [name] Other Clinician: Referring Provider: Treating Provider/Extender: [name] in Treatment: 50 Constitutional . . . . no acute distress. Respiratory Normal work of breathing on room air.. Notes 01/14/2024: The wounds are healed. Electronic Signature(s) Signed: 01/14/2024 7:56:25 AM By: [name] MD FACS Entered By: [name] on 01/14/2024 07:56:25 [name] ([id number]) [phone].pdf Page 4 of 8 -------------------------------------------------------------------------------- Physician Orders Details Patient Name: Date of Service: [name] [place], [name] RO LD 01/14/2024 7:30 A M Medical Record Number: [id number] Patient Account Number: [id number] Date of Birth/Sex: Treating RN: June 01, 1971 (53 y.o. Damaris Schooner Primary Care Provider: [name] Other Clinician: Referring Provider: Treating Provider/Extender: [name] in Treatment: 50 The following information was scribed by: Zenaida Deed The information was scribed for: [name] Verbal / Phone Orders: No Diagnosis Coding ICD-10 Coding Code Description [phone] Non-pressure chronic ulcer of other part of left foot with fat layer exposed L84 Corns and callosities E11.621 Type 2 diabetes mellitus with foot ulcer Z89.422 Acquired absence of other left toe(s) Discharge From [facility] Discharge  from [facility] Bathing/ Shower/ Hygiene May shower and wash wound with soap and water. Off-Loading Other: - callous cushion to left plantar foot Electronic Signature(s) Signed: 01/14/2024 8:00:22 AM By: [name] MD FACS Entered By: [name] on 01/14/2024 07:56:36 -------------------------------------------------------------------------------- Problem List Details Patient Name: Date of Service: [name], [name] 01/14/2024 7:30 A M Medical Record Number: [id number] Patient Account Number: [id number] Date of Birth/Sex: Treating RN: 06/25/1971 (52 y.o. Damaris Schooner Primary Care Provider: [name] Other Clinician: Referring Provider: Treating Provider/Extender: [name] in Treatment: 31 Active Problems ICD-10 Encounter Code Description Active Date MDM Diagnosis [phone] Non-pressure chronic ulcer of other part of left foot with fat layer exposed 01/24/2023 No Yes L84 Corns and callosities 12/22/2023 No Yes E11.621 Type 2 diabetes mellitus with foot ulcer 01/24/2023 No Yes RODELL, GANTZ ([id number]) [phone].pdf Page 5 of 8 [phone] Acquired absence of other  left toe(s) 01/24/2023 No Yes Inactive Problems Resolved Problems Electronic Signature(s) Signed: 01/14/2024 7:53:38 AM By: [name] MD FACS Entered By: [name] on 01/14/2024 07:53:38 -------------------------------------------------------------------------------- Progress Note Details Patient Name: Date of Service: [name], [name] 01/14/2024 7:30 A M Medical Record Number: [id number] Patient Account Number: [id number] Date of Birth/Sex: Treating RN: [birth year] (52 y.o. M) Primary Care Provider: [name] Other Clinician: Referring Provider: Treating Provider/Extender: [name] in Treatment: 50 Subjective Chief Complaint Information obtained from Patient 01/15/2023; left foot  wound History of Present Illness (HPI) ADMISSION 01/11/2020 This is a 53 year old man with uncontrolled type 2 diabetes with a recent hemoglobin A1c earlier this year of 13.4. He is on insulin and glipizide. He does not take his blood sugars at home. He does have a follow-up with primary care later this month I believe on January 27. He tells [place] that roughly a month ago he was walking with a shoe with a hole in his foot. He took the shoe off and there was an open wound at roughly the left fourth met head. This has significant undermining and raised edges. He has not noticed any purulence he does not feel unwell. More recently he was taking skin off the bottom of his foot and has a superficial area on the left fifth met head. He has not been offloading this. The patient was in the ER on 12/20. They gave him Bactroban which she has been using on the wound and 10 days worth of doxycycline. No x-rays were done. He has not had vascular studies. He is also been using hydrogen peroxide. Past medical history type 2 diabetes uncontrolled, chronic systolic heart failure, coronary artery disease with a history of congestive heart failure with stents. Hypertension hyperlipidemia and chronic renal insufficiency ABI in this clinic was 1.14 on the left. 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 He is uncertain whether he would be able to work if we put him on some form of restriction 1/19; he is generally doing quite well. Using silver alginate on the wounds. Things actually look better. He has a forefoot offloading boot which she seems to be compliant about. He has support at work to stay off his foot is much as possible which is gratifying. Culture I did last week showed a few Enterococcus faecalis. I am going to put him on Augmentin. I talked about ordering an x-ray in my note last week but that does not seem to have happened. We will review reorder the x-ray this week. 1/26; x-ray  reordered last week was negative for osteomyelitis. We are using silver alginate on the wound on the third and fifth met heads on the left. He is using a Darco forefoot off loader 2/2; the area on the fifth met head is closed. Third met head is still open with tunneling depth and thick callus. 2/9; the area on the fifth met head remains closed however the third met head again has a small open area on presentation with tunneling in depth and surrounded by thick callus. This looks like a pressure issue. We have been using silver alginate 2/16; the area of the fifth metatarsal head remains closed however the area over the third metatarsal head again is a small open area but with some depth. I do not think this is changed much since last week. He is using Hydrofera Blue with forefoot off loader. He is not able to use a  total contact cast on the left leg because he needs his left leg at work [employer]]. Fortunately the wound does not look infected. I changed him to endoform today 2/26; the area of the fifth metatarsal head remains closed. The area of the third metatarsal head has an even smaller opening this time. I used endoform on this this looks improved. He is offloading this is much as he can and a forefoot off loader on the left. He cannot have a total contact cast because of work responsibilities 3/5; the area on the fifth metatarsal head remains closed the area on the third metatarsal head is also closed on the left foot. 01/15/2023 Mr. 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 He has been following for podiatry for several months for this issue.  He is currently using wet-to-dry saline dressings. He currently denies signs of infection. HASSON, HEIDINGER ([id number]) [id number] Page 6 of 8 [place]se note from 1/17; Patient presents with a 70-month history of nonhealing ulcer to the left foot secondary to diabetes and inability to offload well the area. He has had multiple debridements in the past to his feet bilaterally. He has had resection of the fourth left met head in the fifth toe Secondary to osteomyelitis. We discussed the importance of glycemic control for wound healing. Due to his blood glucose levels being elevated he is at high risk for infection and thus further amputation. He expressed understanding. He states he is supposed to be referred to an endocrinologist at [facility] however the referral fell through. Unclear what happened. Offered a referral to endocrinology at [facility]. Patient was agreeable.Furthermore we discussed the importance of aggressive offloading for his wound healing. This will be the most difficult part of the treatment plan for the patient to do. We discussed a total contact cast however he has declined that at this time. 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 He is currently using a surgical shoe with a peg assist. It does not fit well so we will give him a new one today. I do not think this is enough offloading. If he is not able to offload this area he will likely end up with a BKA. He is well aware of this. For now I recommended Medihoney and Hydrofera Blue for dressing changes. He will follow-up in our [place] office Since this is closer for him. 1/26; patient presents for follow-up. Last clinic visit I had seen him in [place] and we transferred him to [place] since this is a closer location for him. Progress note above from that visit. Over the past week He has been using Medihoney and Hydrofera Blue to  the wound bed. He has been using his surgical shoe with peg assist. He has no issues or complaints today. We discussed doing the total contact cast and he was agreeable to having this placed at next clinic visit. 2/2; patient presents for follow-up. He has been using Vashe wet-to-dry dressings. Plan is for the total contact cast today. 2/5; patient presents for follow-up. The cast was placed 3 days ago. He tolerated this well however  had a lot of drainage. He currently denies signs of infection. 2/9; patient presents for follow-up. At last clinic visit we held off on replacing the cast due to drainage. He has been using Vashe wet-to-dry dressings and he is currently taking the oral antibiotics prescribed without issues. He declines a total contact cast today. We ran insurance verification for skin substitute and due to cost patient declines having this placed. 2/16; patient presents for follow-up. He has decided not to follow-up with podiatry. He continues to decline the total contact cast. He has been using Vashe wet- to-dry dressings. He has not been wearing his surgical shoe with peg assist. He currently denies signs of infection. 3/1; patient presents for follow-up. He has been doing Vashe wet-to-dry dressings. He has a surgical shoe with peg assist. We discussed potentially doing a skin substitute for which she has been approved for by insurance. He knows the out-of-pocket cost of this and would still like to proceed with having this placed today. He denies the current total contact cast. 3/8; patient presents for follow-up. He states that the skin substitute came off after a few days and he has been using Vashe wet-to-dry dressings. He has mild odor to the wound bed on exam. 3/15; patient presents for follow-up. He has been using Vashe wet-to-dry dressings and started his course of Augmentin. He reports improvement in wound healing. He has no issues or complaints today. 3/21; patient presents  for follow-up. He continues to use Vashe wet-to-dry dressings. He has no issues or complaints today. 3/28; patient presents for follow-up. He has been using Vashe wet-to-dry dressings. He has no issues or complaints today. He has been using his peg assist with surgical shoe. 4/11; patient presents for follow-up. He has been using Vashe wet-to-dry dressings. The periwound is macerated. He has been using his peg assist with surgical shoe. He has no issues or complaints today. 4/25; patient presents for follow-up. Has been using Vashe wet-to-dry dressings. He has been using zinc oxide to the periwound. For offloading he has been using his peg assist in surgical shoe. He has no issues or complaints today. 5/9; patient presents for follow-up. He has been using Vashe wet-to-dry dressings. He has no issues or complaints today. He has a surgical shoe with peg assist for offloading. 5/23; patient presents for follow up. He has been using Vashe to clean the wound bed and collagen and blast X with dressing changes. [place] the [place]. Wound is bigger. 5/31; patient presents for follow-up. He has been using Vashe wet-to-dry dressings. Wound is stable but with healthier granulation tissue. Patient would like to hold off on the total contact cast and try a knee scooter for offloading. 6/13; patient presents for follow-up. He has been using Vashe wet-to-dry dressings. He has been using a knee scooter for offloading however states he felt unstable on this and decided to buy an electric scooter. This arrived in the mail today. He would like to hold off on the left total contact cast. Wound is slightly smaller today. 6/27; patient presents for follow-up. He has been using Vashe wet-to-dry dressings. He is using an Art gallery manager for mobility to help relieve the pressure off the wound bed. Wound is smaller today. He wants to hold off on doing the total contact cast as he is showing signs of improvement  with wound healing. 7/11; patient presents for follow-up. He has been using Vashe wet-to-dry dressings and his electric scooter for offloading. Wound is  slightly smaller. 7/25; patient presents for follow up. He has been using Vashe wet-to-dry dressings and he reports using his electric scooter for offloading. Wound is larger. 7/30; patient presents for follow-up. He has been using Vashe wet-to-dry dressings. He has taken Augmentin as prescribed without issues. Overall wound has healthier granulation tissue. 8/1; patient presents for follow-up. He presents for obligatory cast change. He tolerated the cast well and has no issues or complaints today. We have been using antibiotic ointment with Hydrofera Blue under the cast. Wound is slightly smaller. 8/8; patient presents for follow-up. We have been using antibiotic ointment with Hydrofera Blue under the total contact cast. Wound is smaller. He has no issues or complaints today. 8/13; patient presents for follow-up. We have been using antibiotic ointment with Hydrofera Blue under the total contact cast. He comes in today because the top part of the cast over the foot has sunken as well as on the side. He wanted to assure that there were no issues. 8/22; patient presents for follow-up. He has been using Hydrofera Blue to the wound bed. Previously we have been using a total contact cast. We do not have total contact casts due to manufacturing issues available for patients. He has no issues or complaints. He denies signs of infection. 8/27; patient presents for follow-up. He has been using Vashe wet-to-dry dressings. T contact cast is available for placement today. He was agreeable to otal proceed with this. He denies signs of infection. 9/3; patient presents for follow-up. We have been using Hydrofera Blue with antibiotic ointment under the total contact cast for the past week. He has tolerated this well. The wound is smaller. Unfortunately we do not  have a total contact cast in office today for replacement as there is been manufacturing issues. 9/5; patient was seen earlier this week and plan was for a total contact cast but there is none available. The cast finally arrived in patient has come in today to [place] ([id number]) [id number] Page 7 of 8 have this placed. We will continue with Hydrofera Blue and antibiotic ointment under the cast. 9/12; patient presents for follow-up. We have been using Hydrofera Blue and antibiotic ointment under the cast. Wound is stable. He denies signs of infection. 09/17/2023: The wound is measuring smaller today. The surface appears healthy and there is minimal accumulation of slough. 09/23/2023: The wound external measurements remain about the same, but it is shallower and visually appears smaller. 09/30/2023: The wound is covered with a layer of callus. Underneath the callus, there is a tiny superficial residual opening. 10/14/2023: He returns after being away for 2 weeks, during which he was out of a cast. Not surprisingly, his wound has reopened substantially. It remains clean, however. 10/21/2023: We have recovered most of what was lost as far as progress with his absence from clinic. The wound did measure smaller today. It is a little bit macerated around the edges and he reports that he thinks some water got in over the top of his cast while showering. 10/28/2023: The wound measures smaller today. There is no tissue maceration. There is some slough on the surface and a little bit of callus accumulation around the edges. 11/04/2023: The wound measured a little bit deeper today and had some undermining, but this seems to be due to mainly to accumulated callus. The surface has a bit of slough. He has been approved for Grafix so we will plan to apply that today. 11/10/2023: He has responded beautifully to Grafix application.  His wound is down to just a couple of millimeters.  There is some slough on the surface. 11/18/2023: His wound is freshly healed. 11/25/2023: After an additional week in the cast, the skin of the healed surface has greater strength and integrity. 12/22/2023: The patient contacted the clinic due to concern that his wound had reopened. He noticed some darkening underneath the callus that had built up and then then a portion of the callus fell off. There was still a dark central area, concerning for wound reopening. 01/06/2024: He is here for recheck of the wound site. The callus has built up quite extensively and there is some discoloration underneath. Once the callus was debrided, there were 2 very tiny openings, about a millimeter each. No malodor or purulent drainage. No other concern for infection. 01/14/2024: The wounds are healed. Objective Constitutional no acute distress. Vitals Time Taken: 7:42 AM, Height: 78 in, Weight: 275 lbs, BMI: 31.8, Temperature: 97.9 F, Pulse: 78 bpm, Respiratory Rate: 18 breaths/min, Blood Pressure: 114/82 mmHg. Respiratory Normal work of breathing on room air.. General Notes: 01/14/2024: The wounds are healed. Integumentary (Hair, Skin) Wound #4 status is Open. Original cause of wound was Pressure Injury. The date acquired was: 12/22/2023. The wound has been in treatment 3 weeks. The wound is located on the Left,Lateral Foot. The wound measures 0cm length x 0cm width x 0cm depth; 0cm^2 area and 0cm^3 volume. There is no tunneling or undermining noted. There is a none present amount of drainage noted. There is no granulation within the wound bed. There is no necrotic tissue within the wound bed. The periwound skin appearance had no abnormalities noted for moisture. The periwound skin appearance had no abnormalities noted for color. The periwound skin appearance exhibited: Callus. Periwound temperature was noted as No Abnormality. 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 Off-Loading: Other: - callous cushion to left plantar foot 01/14/2024: His wounds are healed. He has been fitted for inserts. While he is waiting to receive them, he will continue to use callus pads and make an effort to avoid excessive walking or standing. Will discharge him from the wound care center. He may follow-up in the future, should need arise. Electronic Signature(s) Signed: 01/14/2024 7:59:29 AM By: [name] MD FACS Entered By: [name] on 01/14/2024 07:59:29 -------------------------------------------------------------------------------- SuperBill Details Patient Name: Date of Service: [name] IN, [name] 01/14/2024 Medical Record Number: [id number] Patient Account Number: [id number] Date of Birth/Sex: Treating RN: 1971/11/06 (52 y.o. Damaris Schooner Primary Care Provider: [name] Other Clinician: Referring Provider: Treating Provider/Extender: [name] in Treatment: 50 Diagnosis Coding ICD-10 Codes Code Description [phone] Non-pressure chronic ulcer of other part of left foot with fat layer exposed L84 Corns and callosities E11.621 Type 2 diabetes mellitus with foot ulcer Z89.422 Acquired absence of other left toe(s) Facility Procedures : CPT4 Code: [id number] Description: 99213 - WOUND CARE VISIT-LEV 3 EST PT Modifier: Quantity: 1 Physician Procedures : CPT4 Code Description Modifier [id number] - WC PHYS LEVEL 2 - EST PT ICD-10 Diagnosis Description L97.522 Non-pressure chronic ulcer of other part of left foot with fat layer exposed L84 Corns and callosities  E11.621  Type 2 diabetes mellitus with  foot ulcer Z89.422 Acquired absence of other left toe(s) Quantity: 1 Electronic Signature(s) Signed: 01/14/2024 7:59:44 AM By: [name] MD FACS Entered By: [name] on 01/14/2024 07:59:44

## 2024-01-16 NOTE — Telephone Encounter (Signed)
Called patient and called [employer] to refax the paperwork to our email. Will be on the look out for paperwork

## 2024-01-19 ENCOUNTER — Other Ambulatory Visit: Payer: Self-pay

## 2024-01-19 ENCOUNTER — Other Ambulatory Visit (HOSPITAL_COMMUNITY): Payer: Self-pay

## 2024-01-20 ENCOUNTER — Telehealth (HOSPITAL_BASED_OUTPATIENT_CLINIC_OR_DEPARTMENT_OTHER): Payer: 59 | Admitting: Family Medicine

## 2024-01-20 ENCOUNTER — Other Ambulatory Visit (HOSPITAL_COMMUNITY): Payer: Self-pay

## 2024-01-20 ENCOUNTER — Other Ambulatory Visit: Payer: Self-pay

## 2024-01-20 ENCOUNTER — Other Ambulatory Visit: Payer: Self-pay | Admitting: Family Medicine

## 2024-01-20 ENCOUNTER — Encounter: Payer: Self-pay | Admitting: Family Medicine

## 2024-01-20 DIAGNOSIS — Z794 Long term (current) use of insulin: Secondary | ICD-10-CM

## 2024-01-20 DIAGNOSIS — S98132A Complete traumatic amputation of one left lesser toe, initial encounter: Secondary | ICD-10-CM | POA: Diagnosis not present

## 2024-01-20 DIAGNOSIS — E1165 Type 2 diabetes mellitus with hyperglycemia: Secondary | ICD-10-CM

## 2024-01-20 DIAGNOSIS — Z7984 Long term (current) use of oral hypoglycemic drugs: Secondary | ICD-10-CM

## 2024-01-20 MED ORDER — OMEPRAZOLE 20 MG PO CPDR
20.0000 mg | DELAYED_RELEASE_CAPSULE | Freq: Every day | ORAL | 1 refills | Status: DC
Start: 2024-01-20 — End: 2024-07-13
  Filled 2024-01-20 – 2024-01-26 (×2): qty 90, 90d supply, fill #0
  Filled 2024-04-19: qty 90, 90d supply, fill #1

## 2024-01-20 NOTE — Telephone Encounter (Signed)
Requested Prescriptions  Pending Prescriptions Disp Refills   omeprazole (PRILOSEC) 20 MG capsule 90 capsule 1    Sig: Take 1 capsule (20 mg total) by mouth daily.     Gastroenterology: Proton Pump Inhibitors Passed - 01/20/2024  2:01 PM      Passed - Valid encounter within last 12 months    Recent Outpatient Visits           3 months ago Uncontrolled type 2 diabetes mellitus with hyperglycemia (HCC)   [facility] [facility] - A Dept Of [place]. [facility] Hoy Register, MD   8 months ago Uncontrolled type 2 diabetes mellitus with hyperglycemia [facility])   [place] [facility] Dept Of [place]. [facility] Hoy Register, MD   1 year ago Uncontrolled type 2 diabetes mellitus with hyperglycemia [facility])   [place] [facility] - A Dept Of [place]. [facility] Storm Frisk, MD   1 year ago Uncontrolled type 2 diabetes mellitus with hyperglycemia [facility])   [place] [facility] - A Dept Of [place]. [facility] Storm Frisk, MD   1 year ago Diabetes mellitus type 2 in obese [facility])    [facility] - A Dept Of [place]. [facility] Storm Frisk, MD       Future Appointments             In 6 days Hoy Register, MD [facility] - A Dept Of [place] [facility]ideg[facility]. [facility]   In 4 weeks Shamleffer, Konrad Dolores, MD [facility] Endocrinology

## 2024-01-20 NOTE — Telephone Encounter (Signed)
Pt is calling in saying [employer] is requesting clinic notes regarding him getting diabetic inserts for his shoes. They have received everything but the notes.

## 2024-01-20 NOTE — Progress Notes (Signed)
Virtual Visit via Video Note  I connected with [name], on 01/20/2024 at 3:03 PM by video enabled telemedicine device and verified that I am speaking with the correct person using two identifiers.   Consent: I discussed the limitations, risks, security and privacy concerns of performing an evaluation and management service by telemedicine and the availability of in person appointments. I also discussed with the patient that there may be a patient responsible charge related to this service. The patient expressed understanding and agreed to proceed.   Location of Patient: Work  Government social research officer of Provider: Clinic   Persons participating in Telemedicine visit: [name] Dr. Alvis Lemmings     History of Present Illness: [name] is a 53 y.o. 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   Discussed the use of AI scribe software for clinical note transcription with the patient, who gave verbal consent to proceed.  The patient, with a history of diabetes, neuropathy, and a previous amputation of the left fifth toe, presents with a need for diabetic shoes. He reports recent healing of a wound and is currently wearing regular tennis shoes. He has been in contact with a shoe provider, Teacher, early years/pre, but has experienced difficulties in obtaining the shoes. He acknowledges the presence of neuropathy.       Past Medical History:  Diagnosis Date   Acute systolic CHF (congestive heart failure) (HCC) 10/28/2016   CAD (coronary artery disease)    NSTEMI 10/2011: LHC 11/04/11: pLAD 90%, mLAD 60-70%, small D2 sub totally occluded at ostium, small OM1 90% ostial, 90% mid, mOM2 30%, oPL 80%, RCA 30%, dPDA 80%, EF 20% with ant AK.  PCI:  Promus DES to pLAD.    Cellulitis of left foot 06/22/2020   Chronic kidney disease    CKD   Chronic systolic heart failure (HCC)    Diabetic ulcer of left midfoot associated with type 2 diabetes mellitus, limited to breakdown of skin (HCC)    Diabetic ulcer of left midfoot associated with type 2 diabetes mellitus, with muscle involvement without evidence of necrosis (HCC)    DM2 (diabetes mellitus, type 2) (HCC)    type 2   GERD (gastroesophageal reflux disease)    HTN (hypertension)    Hyperlipidemia    Ischemic cardiomyopathy    Echo 11/03/11: mod LVH, mild focal basal septal hypertrophy, EF 15%, grade 2 diast dysfxn, mild MR, mild to mod LAE, mild RVE, mild to mod reduced RVSF.  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     amoxicillin-clavulanate (AUGMENTIN) 875-125 MG tablet Take 1 tablet by mouth 2 (two) times daily for 7 days. (Patient not taking: Reported on 10/15/2023) 14 tablet  0   aspirin 81 MG chewable tablet Chew 1 tablet (81 mg total) by mouth daily. 30 tablet 6   atorvastatin (LIPITOR) 80 MG tablet Take 1 tablet (80 mg total) by mouth every evening. 90 tablet 1   Blood Glucose Monitoring Suppl (ONETOUCH VERIO) w/Device KIT Use to check blood sugar 3 times daily. 1 kit 0   carvedilol (COREG) 6.25 MG tablet Take 1 tablet (6.25 mg total) by mouth 2 (two) times daily with a meal. 180 tablet 1   dapagliflozin propanediol (FARXIGA) 10 MG TABS tablet Take 1 tablet (10 mg total) by  mouth daily. 30 tablet 2   Evolocumab (REPATHA SURECLICK) 140 MG/ML SOAJ Inject 140 mg into the skin every 14 (fourteen) days. 2 mL 11   furosemide (LASIX) 40 MG tablet Take 1 tablet (40 mg total) by mouth daily. 90 tablet 1   glucose blood test strip Use to test blood sugar 3 times a day 100 each 0   Insulin Glargine (BASAGLAR KWIKPEN) 100 UNIT/ML Inject 65 Units into the skin daily. 30 mL 6   insulin lispro (HUMALOG KWIKPEN) 100 UNIT/ML KwikPen Inject 10 Units into the skin 3 (three) times daily before meals. Hold if blood sugar is less than 150 15 mL 11   levofloxacin (LEVAQUIN) 750 MG tablet Take 1 tablet (750 mg total) by mouth daily for 7 days (Patient not taking: Reported on 05/05/2023) 7 tablet 0   Misc. Devices MISC Diabetic shoes and inserts.  Fax to [phone] 1 each 0   omeprazole (PRILOSEC) 20 MG capsule Take 1 capsule (20 mg total) by mouth daily. 90 capsule 1   sacubitril-valsartan (ENTRESTO) 97-103 MG Take 1 tablet by mouth 2 (two) times daily. 180 tablet 1   spironolactone (ALDACTONE) 50 MG tablet Take 1 tablet (50 mg total) by mouth daily. 90 tablet 1   Current Facility-Administered Medications on File Prior to Visit  Medication Dose Route Frequency Provider Last Rate Last Admin   vancomycin (VANCOCIN) powder    PRN [place] Liter, DPM   1,000 mg at 01/24/21 1411    ROS: See HPI  Observations/Objective: Awake, alert, oriented x3 Not in acute distress Normal mood      Latest Ref Rng & Units 10/15/2023    4:08 PM 11/28/2022   11:33 AM 08/27/2022    3:37 PM  CMP  Glucose 70 - 99 mg/dL 829  562  130   BUN 6 - 24 mg/dL 37  25  31   Creatinine 0.76 - 1.27 mg/dL 8.65  7.84  6.96   Sodium 134 - 144 mmol/L 138  139  137   Potassium 3.5 - 5.2 mmol/L 4.0  4.8  5.3   Chloride 96 - 106 mmol/L 100  99  98   CO2 20 - 29 mmol/L 21  24  22    Calcium 8.7 - 10.2 mg/dL 9.7  29.5  9.6   Total Protein 6.0 - 8.5 g/dL 7.4   7.4   Total Bilirubin 0.0 - 1.2 mg/dL 0.4   0.5    Alkaline Phos 44 - 121 IU/L 82   77   AST 0 - 40 IU/L 12   16   ALT 0 - 44 IU/L 17   22     Lipid Panel     Component Value Date/Time   CHOL 184 08/27/2022 1537   TRIG 169 (H) 08/27/2022 1537   HDL 42 08/27/2022 1537   CHOLHDL 4.4 08/27/2022 1537   CHOLHDL  2.7 09/14/2021 0950   VLDL 19 09/14/2021 0950   LDLCALC 112 (H) 08/27/2022 1537   LDLDIRECT 117.0 06/14/2013 0826   LABVLDL 30 08/27/2022 1537    Lab Results  Component Value Date   HGBA1C 14.1 (A) 10/15/2023     Assessment and Plan:     Diabetic Foot Care History of left fifth toe amputation and neuropathy. Recently healed wound. No current foot pain or numbness. -Write and fax note to [employer] to facilitate provision of diabetic shoes and inserts.  2.  Type 2 diabetes mellitus Uncontrolled with A1c of 14.1 -Keep appointment for management of chronic medical conditions and recheck A1c -Counseled on Diabetic diet, my plate method, 629 minutes of moderate intensity exercise/week Blood sugar logs with fasting goals of 80-120 mg/dl, random of less than 528 and in the event of sugars less than 60 mg/dl or greater than 413 mg/dl encouraged to notify the clinic. Advised on the need for annual eye exams, annual foot exams, Pneumonia vaccine.          Follow Up Instructions: Keep upcoming appointment   I discussed the assessment and treatment plan with the patient. The patient was provided an opportunity to ask questions and all were answered. The patient agreed with the plan and demonstrated an understanding of the instructions.   The patient was advised to call back or seek an in-person evaluation if the symptoms worsen or if the condition fails to improve as anticipated.     I provided 12 minutes total of Telehealth time during this encounter including median intraservice time, reviewing previous notes, investigations, ordering medications, medical decision making, coordinating care and  patient verbalized understanding at the end of the visit.     Hoy Register, MD, FAAFP. [facility] and Wellness [place], [place] [phone]   01/20/2024, 3:03 PM

## 2024-01-20 NOTE — Telephone Encounter (Signed)
Pt has been scheduled for a mychart video visit today with PCP, office notes will be emailed once visit is complete.

## 2024-01-21 ENCOUNTER — Other Ambulatory Visit: Payer: Self-pay

## 2024-01-22 ENCOUNTER — Other Ambulatory Visit: Payer: Self-pay

## 2024-01-22 ENCOUNTER — Other Ambulatory Visit (HOSPITAL_COMMUNITY): Payer: Self-pay

## 2024-01-23 ENCOUNTER — Telehealth: Payer: Self-pay

## 2024-01-23 NOTE — Telephone Encounter (Signed)
Copied from CRM [id number]. Topic: General - Other >> Jan 19, 2024 11:21 AM Macon Large wrote: Reason for CRM: Megan with [employer] stated that they received the forms back but did not receive the notes. Megan requests that the office notes be sent. Cb# [phone]     Office notes has been emailed to [employer].

## 2024-01-26 ENCOUNTER — Ambulatory Visit: Payer: 59 | Attending: Family Medicine | Admitting: Family Medicine

## 2024-01-26 ENCOUNTER — Other Ambulatory Visit: Payer: Self-pay

## 2024-01-26 ENCOUNTER — Encounter: Payer: Self-pay | Admitting: Family Medicine

## 2024-01-26 ENCOUNTER — Other Ambulatory Visit (HOSPITAL_COMMUNITY): Payer: Self-pay

## 2024-01-26 VITALS — BP 106/70 | HR 72 | Ht 78.0 in | Wt 274.0 lb

## 2024-01-26 DIAGNOSIS — Z7984 Long term (current) use of oral hypoglycemic drugs: Secondary | ICD-10-CM | POA: Diagnosis not present

## 2024-01-26 DIAGNOSIS — E1122 Type 2 diabetes mellitus with diabetic chronic kidney disease: Secondary | ICD-10-CM

## 2024-01-26 DIAGNOSIS — I129 Hypertensive chronic kidney disease with stage 1 through stage 4 chronic kidney disease, or unspecified chronic kidney disease: Secondary | ICD-10-CM

## 2024-01-26 DIAGNOSIS — S98132A Complete traumatic amputation of one left lesser toe, initial encounter: Secondary | ICD-10-CM | POA: Diagnosis not present

## 2024-01-26 DIAGNOSIS — E1165 Type 2 diabetes mellitus with hyperglycemia: Secondary | ICD-10-CM | POA: Diagnosis not present

## 2024-01-26 DIAGNOSIS — N529 Male erectile dysfunction, unspecified: Secondary | ICD-10-CM

## 2024-01-26 DIAGNOSIS — I5042 Chronic combined systolic (congestive) and diastolic (congestive) heart failure: Secondary | ICD-10-CM | POA: Diagnosis not present

## 2024-01-26 DIAGNOSIS — Z125 Encounter for screening for malignant neoplasm of prostate: Secondary | ICD-10-CM

## 2024-01-26 DIAGNOSIS — I110 Hypertensive heart disease with heart failure: Secondary | ICD-10-CM | POA: Diagnosis not present

## 2024-01-26 DIAGNOSIS — Z794 Long term (current) use of insulin: Secondary | ICD-10-CM

## 2024-01-26 LAB — POCT ABI - SCREENING FOR PILOT NO CHARGE
Left ABI: 1.22
Right ABI: 1.15

## 2024-01-26 LAB — POCT GLYCOSYLATED HEMOGLOBIN (HGB A1C): HbA1c, POC (controlled diabetic range): 12.1 % — AB (ref 0.0–7.0)

## 2024-01-26 MED ORDER — BASAGLAR KWIKPEN 100 UNIT/ML ~~LOC~~ SOPN
70.0000 [IU] | PEN_INJECTOR | Freq: Every day | SUBCUTANEOUS | 6 refills | Status: DC
  Filled 2024-01-26 (×2): qty 30, 42d supply, fill #0
  Filled 2024-03-03: qty 21, 30d supply, fill #1
  Filled 2024-04-05: qty 21, 30d supply, fill #2
  Filled 2024-05-10: qty 21, 30d supply, fill #3
  Filled 2024-06-15: qty 21, 30d supply, fill #4
  Filled 2024-07-13: qty 21, 30d supply, fill #5
  Filled 2024-08-09: qty 21, 30d supply, fill #6
  Filled 2024-09-07 (×2): qty 21, 30d supply, fill #7
  Filled 2024-10-11: qty 21, 30d supply, fill #8
  Filled 2024-11-08: qty 12, 17d supply, fill #9

## 2024-01-26 MED ORDER — SILDENAFIL CITRATE 100 MG PO TABS
100.0000 mg | ORAL_TABLET | Freq: Every day | ORAL | 5 refills | Status: AC | PRN
  Filled 2024-01-26: qty 10, 10d supply, fill #0
  Filled 2024-02-19: qty 10, 10d supply, fill #1
  Filled 2024-04-05: qty 10, 10d supply, fill #2
  Filled 2024-07-13: qty 10, 10d supply, fill #3
  Filled 2024-10-11: qty 10, 10d supply, fill #4
  Filled 2024-11-22: qty 10, 10d supply, fill #5

## 2024-01-26 NOTE — Patient Instructions (Signed)
VISIT SUMMARY:  During today's visit, we discussed several important health concerns, including your diabetes management, erectile dysfunction, foot health, and routine health maintenance. We reviewed your current medications and made some adjustments to better control your diabetes. We also addressed your concerns about erectile dysfunction and discussed the need for routine health screenings.  YOUR PLAN:  -DIABETES MELLITUS: Diabetes Mellitus is a condition where your blood sugar levels are too high. Your A1c level is currently 12.1, which indicates poor control. We have increased your Lantus dose to 70 units and advised you to take Novolog three times a day with meals. Please check your fasting labs, including A1c and testosterone, on 02/02/2024.  -ERECTILE DYSFUNCTION: Erectile Dysfunction is the inability to get or keep an erection firm enough for sex. We have prescribed sildenafil 100mg  to be taken as needed. There are no contraindications with your current heart medications.  Will also check your test Stearn levels.  -HYPERLIPIDEMIA: Hyperlipidemia is having high levels of fats in your blood, which can increase your risk of heart disease. We will check your fasting lipid panel on 02/02/2024.  -PROSTATE HEALTH: Prostate health is important for urinary and sexual function. Your last PSA test was in 2017 and was normal. We will check your PSA on 02/02/2024.  -FOOT HEALTH: Your foot health has improved since your recent injury. Continue to monitor your foot health and follow up with your podiatrist as needed.  -COLON CANCER SCREENING: Colon cancer screening is important for early detection of colon cancer. We will discuss the option of a colonoscopy at a future visit.  -EYE HEALTH: Regular eye exams are important for detecting issues early, especially with your diabetes. We will discuss a referral to an ophthalmologist at a future visit once your insurance status is  clarified.  INSTRUCTIONS:  Please follow up with your endocrinologist on 02/17/2024. Additionally, make sure to get your fasting labs, including A1c, testosterone, lipid panel, and PSA, done on 02/02/2024.

## 2024-01-26 NOTE — Progress Notes (Signed)
Subjective:  Patient ID: [name], male    DOB: Jun 28, 1971  Age: 53 y.o. MRN: [id number]  CC: Medical Management of Chronic Issues   HPI Vinton Layson is a 53 y.o. 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   Interval History: Discussed the use of AI scribe software for clinical note transcription with the patient, who gave verbal consent to proceed.  He presents with concerns about erectile dysfunction (ED). He reports that this has become a more pressing issue due to a new relationship. He has tried sildenafil in the past with some improvement, but not complete resolution of the ED. He expresses fear about potential interactions between ED medications and his heart medications.  The patient also discusses his ongoing management of diabetes. He reports some confusion about his insulin regimen and administer 65 units of Lantus at bedtime but his NovoLog only once a day in the morning rather than 3 times daily before meals.  He reports his sugars range from 140-250.  In addition he is on [place].  From a cardiac standpoint he is stable with no dyspnea or chest pain and has been adherent with his cardiac regimen.  In addition, the patient mentions a recent foot ulcer, which has since healed. He has been managing this with over-the-counter shoe inserts, as prescribed inserts were too expensive. He reports that his mobility has improved since the healing of the foot injury.  The patient is due for routine health maintenance, including cholesterol testing, a colonoscopy, and an eye exam. However, he has not scheduled these due to various personal reasons, including concerns about insurance coverage and a busy schedule.        Past Medical History:  Diagnosis Date    Acute systolic CHF (congestive heart failure) (HCC) 10/28/2016   CAD (coronary artery disease)    NSTEMI 10/2011: LHC 11/04/11: pLAD 90%, mLAD 60-70%, small D2 sub totally occluded at ostium, small OM1 90% ostial, 90% mid, mOM2 30%, oPL 80%, RCA 30%, dPDA 80%, EF 20% with ant AK.  PCI:  Promus DES to pLAD.   Cellulitis of left foot 06/22/2020   Chronic kidney disease    CKD   Chronic systolic heart failure (HCC)    Diabetic ulcer of left midfoot associated with type 2 diabetes mellitus, limited to breakdown of skin (HCC)    Diabetic ulcer of left midfoot associated with type 2 diabetes mellitus, with muscle involvement without evidence of necrosis (HCC)    DM2 (diabetes mellitus, type 2) (HCC)    type 2   GERD (gastroesophageal reflux disease)    HTN (hypertension)    Hyperlipidemia    Ischemic cardiomyopathy    Echo 11/03/11: mod LVH, mild focal basal septal hypertrophy, EF 15%, grade 2 diast dysfxn, mild MR, mild to mod LAE, mild RVE, mild to mod reduced RVSF.  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   Ulcer of right foot (HCC) 06/22/2020    Past Surgical History:  Procedure Laterality Date   AMPUTATION Left 06/23/2020   Procedure: AMPUTATION RAY 5th;  Surgeon: Vivi Barrack, DPM;  Location: [facility] OR;  Service: Podiatry;  Laterality: Left;   BONE BIOPSY Left 12/20/2020   Procedure: BONE BIOPSY X 3;  Surgeon: Park Liter, DPM;  Location: [facility];  Service: Podiatry;  Laterality: Left;   CARDIAC CATHETERIZATION N/A 10/25/2016   Procedure: Right/Left Heart Cath and Coronary Angiography;  Surgeon: Marykay Lex, MD;  Location: [facility] INVASIVE CV LAB;  Service: Cardiovascular;  Laterality: N/A;   CARDIAC CATHETERIZATION N/A 10/25/2016   Procedure: Coronary Stent Intervention;  Surgeon: Marykay Lex, MD;  Location: [facility] INVASIVE CV LAB;  Service:  Cardiovascular;  Laterality: N/A;   CARDIAC CATHETERIZATION N/A 10/28/2016   Procedure: Coronary Stent Intervention;  Surgeon: Kathleene Hazel, MD;  Location: [facility] INVASIVE CV LAB;  Service: Cardiovascular;  Laterality: N/A;   Feet surgery Bilateral    GRAFT APPLICATION Left 07/14/2020   Procedure: APPLICATION OF SKIN GRAFT USING INTEGRA BILAYER MATRIX WOUND DRESSING;  Surgeon: Park Liter, DPM;  Location: [facility] OR;  Service: Podiatry;  Laterality: Left;   I & D EXTREMITY Left 07/14/2020   Procedure: IRRIGATION AND DEBRIDEMENT LEFT FOOT;  Surgeon: Park Liter, DPM;  Location: [facility] OR;  Service: Podiatry;  Laterality: Left;   LEFT AND RIGHT HEART CATHETERIZATION WITH CORONARY ANGIOGRAM N/A 11/04/2011   Procedure: LEFT AND RIGHT HEART CATHETERIZATION WITH CORONARY ANGIOGRAM;  Surgeon: Rollene Rotunda, MD;  Location: [facility] CATH LAB;  Service: Cardiovascular;  Laterality: N/A;   METATARSAL HEAD EXCISION Left 01/24/2021   Procedure: 4th METATARSAL HEAD EXCISION, Proximal phalanx resection;  Surgeon: Park Liter, DPM;  Location: [facility];  Service: Podiatry;  Laterality: Left;   METATARSAL HEAD EXCISION Right 03/30/2021   Procedure: PROXIMAL 5TH TOE RESECTION ON RIGHT;  Surgeon: Park Liter, DPM;  Location: [facility];  Service: Podiatry;  Laterality: Right;   PERCUTANEOUS CORONARY STENT INTERVENTION (PCI-S)  11/04/2011   Procedure: PERCUTANEOUS CORONARY STENT INTERVENTION (PCI-S);  Surgeon: Tonny Bollman, MD;  Location: [facility] CATH LAB;  Service: Cardiovascular;;   WOUND DEBRIDEMENT Left 06/26/2020   Procedure: LEFT FOOT WOUND DEBRIDEMENT AND GRAFT APPLICATION;  Surgeon: Vivi Barrack, DPM;  Location: [facility] OR;  Service: Podiatry;  Laterality: Left;   WOUND DEBRIDEMENT Left 12/20/2020   Procedure: DEBRIDEMENT WOUND AND POSSIBLE CLOSURE;  Surgeon: Park Liter, DPM;  Location: [facility];  Service: Podiatry;  Laterality: Left;   WOUND DEBRIDEMENT Left 01/24/2021   Procedure: DEBRIDEMENT  WOUND;  Surgeon: Park Liter, DPM;  Location: [facility];  Service: Podiatry;  Laterality: Left;   WOUND DEBRIDEMENT Bilateral 03/30/2021   Procedure: DEBRIDEMENT WOUND WITH SKIN GRAFT SUBSTITUTE APPLICATION;  Surgeon: Park Liter, DPM;  Location: [facility];  Service: Podiatry;  Laterality: Bilateral;   WOUND DEBRIDEMENT Right 04/20/2021   Procedure: DEBRIDEMENT Valrie Hart FOOT;  Surgeon: Park Liter, DPM;  Location: [facility];  Service: Podiatry;  Laterality: Right;    Family History  Problem Relation Age of Onset   Heart disease Father    Heart disease Mother        MOTHER HAD CABG    Social History   Socioeconomic History   Marital status: Single    Spouse name: Not on file   Number of children: Not on file   Years of education: Not on file   Highest education level: Not on file  Occupational History   Not on file  Tobacco Use   Smoking status: Never   Smokeless tobacco: Never  Vaping Use   Vaping status: Never Used  Substance and Sexual Activity  Alcohol use: Not Currently    Alcohol/week: 1.0 standard drink of alcohol    Types: 1 Shots of liquor per week   Drug use: No   Sexual activity: Yes    Birth control/protection: None  Other Topics Concern   Not on file  Social History Narrative   Not on file   Social Drivers of Health   Financial Resource Strain: Low Risk  (01/26/2024)   Overall Financial Resource Strain (CARDIA)    Difficulty of Paying Living Expenses: Not hard at all  Food Insecurity: No Food Insecurity (01/26/2024)   Hunger Vital Sign    Worried About Running Out of Food in the Last Year: Never true    Ran Out of Food in the Last Year: Never true  Transportation Needs: No Transportation Needs (01/26/2024)   PRAPARE - Administrator, Civil Service (Medical): No    Lack of Transportation (Non-Medical): No  Physical Activity: Inactive (01/26/2024)   Exercise Vital Sign    Days of Exercise per Week: 0 days    Minutes of Exercise per  Session: 0 min  Stress: No Stress Concern Present (01/26/2024)   [employer] of Occupational Health - Occupational Stress Questionnaire    Feeling of Stress : Not at all  Social Connections: Socially Isolated (01/26/2024)   Social Connection and Isolation Panel [NHANES]    Frequency of Communication with Friends and Family: More than three times a week    Frequency of Social Gatherings with Friends and Family: Once a week    Attends Religious Services: Never    Database administrator or Organizations: No    Attends Engineer, structural: Never    Marital Status: Never married    Allergies  Allergen Reactions   Ozempic (0.25 Or 0.5 Mg-Dose) [Semaglutide(0.25 Or 0.5mg -Dos)] Other (See Comments)    Severe abdominal pain   Jardiance [Empagliflozin] Rash    Outpatient Medications Prior to Visit  Medication Sig Dispense Refill   acetaminophen (TYLENOL) 500 MG tablet Take 500 mg by mouth every 6 (six) hours as needed.     aspirin 81 MG chewable tablet Chew 1 tablet (81 mg total) by mouth daily. 30 tablet 6   atorvastatin (LIPITOR) 80 MG tablet Take 1 tablet (80 mg total) by mouth every evening. 90 tablet 1   Blood Glucose Monitoring Suppl (ONETOUCH VERIO) w/Device KIT Use to check blood sugar 3 times daily. 1 kit 0   carvedilol (COREG) 6.25 MG tablet Take 1 tablet (6.25 mg total) by mouth 2 (two) times daily with a meal. 180 tablet 1   dapagliflozin propanediol (FARXIGA) 10 MG TABS tablet Take 1 tablet (10 mg total) by mouth daily. 30 tablet 2   furosemide (LASIX) 40 MG tablet Take 1 tablet (40 mg total) by mouth daily. 90 tablet 1   glucose blood test strip Use to test blood sugar 3 times a day 100 each 0   insulin lispro (HUMALOG KWIKPEN) 100 UNIT/ML KwikPen Inject 10 Units into the skin 3 (three) times daily before meals. Hold if blood sugar is less than 150 15 mL 11   levofloxacin (LEVAQUIN) 750 MG tablet Take 1 tablet (750 mg total) by mouth daily for 7 days 7 tablet 0    Misc. Devices MISC Diabetic shoes and inserts.  Fax to [phone] 1 each 0   omeprazole (PRILOSEC) 20 MG capsule Take 1 capsule (20 mg total) by mouth daily. 90 capsule 1   sacubitril-valsartan (ENTRESTO) 97-103 MG  Take 1 tablet by mouth 2 (two) times daily. 180 tablet 1   spironolactone (ALDACTONE) 50 MG tablet Take 1 tablet (50 mg total) by mouth daily. 90 tablet 1   Insulin Glargine (BASAGLAR KWIKPEN) 100 UNIT/ML Inject 65 Units into the skin daily. 30 mL 6   amoxicillin-clavulanate (AUGMENTIN) 875-125 MG tablet Take 1 tablet by mouth 2 (two) times daily for 7 days. (Patient not taking: Reported on 01/26/2024) 14 tablet 0   Evolocumab (REPATHA SURECLICK) 140 MG/ML SOAJ Inject 140 mg into the skin every 14 (fourteen) days. (Patient not taking: Reported on 01/26/2024) 2 mL 11   Facility-Administered Medications Prior to Visit  Medication Dose Route Frequency Provider Last Rate Last Admin   vancomycin (VANCOCIN) powder    PRN Park Liter, DPM   1,000 mg at 01/24/21 1411     ROS Review of Systems  Constitutional:  Negative for activity change and appetite change.  HENT:  Negative for sinus pressure and sore throat.   Respiratory:  Negative for chest tightness, shortness of breath and wheezing.   Cardiovascular:  Negative for chest pain and palpitations.  Gastrointestinal:  Negative for abdominal distention, abdominal pain and constipation.  Genitourinary: Negative.   Musculoskeletal: Negative.   Psychiatric/Behavioral:  Negative for behavioral problems and dysphoric mood.     Objective:  BP 106/70   Pulse 72   Ht 6\' 6"  (1.981 m)   Wt 274 lb (124.3 kg)   SpO2 99%   BMI 31.66 kg/m      01/26/2024    8:39 AM 10/15/2023    3:26 PM 05/05/2023    8:47 AM  BP/Weight  Systolic BP 106 104 116  Diastolic BP 70 73 82  Wt. (Lbs) 274 295.2 284.8  BMI 31.66 kg/m2 34.11 kg/m2 32.91 kg/m2      Physical Exam Constitutional:      Appearance: He is well-developed.  Cardiovascular:      Rate and Rhythm: Normal rate.     Heart sounds: Normal heart sounds. No murmur heard. Pulmonary:     Effort: Pulmonary effort is normal.     Breath sounds: Normal breath sounds. No wheezing or rales.  Chest:     Chest wall: No tenderness.  Abdominal:     General: Bowel sounds are normal. There is no distension.     Palpations: Abdomen is soft. There is no mass.     Tenderness: There is no abdominal tenderness.  Musculoskeletal:        General: Normal range of motion.     Right lower leg: No edema.     Left lower leg: No edema.  Neurological:     Mental Status: He is alert and oriented to person, place, and time.  Psychiatric:        Mood and Affect: Mood normal.    Diabetic Foot Exam - Simple   Simple Foot Form Visual Inspection See comments: Yes Sensation Testing See comments: Yes Pulse Check See comments: Yes Comments Amputation of left fourth and fifth toes with callus on lateral aspect of sole of foot. Decreased monofilament testing bilaterally Unable to palpate posterior tibialis and dorsalis pedis pulses bilaterally. ABI: Left - 1.22;  right -1.15        Latest Ref Rng & Units 10/15/2023    4:08 PM 11/28/2022   11:33 AM 08/27/2022    3:37 PM  CMP  Glucose 70 - 99 mg/dL 948  546  270   BUN 6 - 24 mg/dL 37  25  31   Creatinine 0.76 - 1.27 mg/dL 6.57  8.46  9.62   Sodium 134 - 144 mmol/L 138  139  137   Potassium 3.5 - 5.2 mmol/L 4.0  4.8  5.3   Chloride 96 - 106 mmol/L 100  99  98   CO2 20 - 29 mmol/L 21  24  22    Calcium 8.7 - 10.2 mg/dL 9.7  95.2  9.6   Total Protein 6.0 - 8.5 g/dL 7.4   7.4   Total Bilirubin 0.0 - 1.2 mg/dL 0.4   0.5   Alkaline Phos 44 - 121 IU/L 82   77   AST 0 - 40 IU/L 12   16   ALT 0 - 44 IU/L 17   22     Lipid Panel     Component Value Date/Time   CHOL 184 08/27/2022 1537   TRIG 169 (H) 08/27/2022 1537   HDL 42 08/27/2022 1537   CHOLHDL 4.4 08/27/2022 1537   CHOLHDL 2.7 09/14/2021 0950   VLDL 19 09/14/2021 0950    LDLCALC 112 (H) 08/27/2022 1537   LDLDIRECT 117.0 06/14/2013 0826    CBC    Component Value Date/Time   WBC 9.0 08/27/2022 1537   WBC 11.1 (H) 04/20/2021 1550   RBC 5.22 08/27/2022 1537   RBC 4.67 04/20/2021 1550   HGB 14.5 08/27/2022 1537   HCT 44.2 08/27/2022 1537   PLT 223 08/27/2022 1537   MCV 85 08/27/2022 1537   MCH 27.8 08/27/2022 1537   MCH 25.9 (L) 04/20/2021 1550   MCHC 32.8 08/27/2022 1537   MCHC 32.2 04/20/2021 1550   RDW 12.2 08/27/2022 1537   LYMPHSABS 2.8 08/27/2022 1537   MONOABS 0.8 06/27/2020 0354   EOSABS 0.2 08/27/2022 1537   BASOSABS 0.1 08/27/2022 1537    Lab Results  Component Value Date   HGBA1C 12.1 (A) 01/26/2024    Assessment & Plan:      Type II Diabetes Mellitus Poorly controlled with A1c of 12.1. Patient is not adhering to prescribed insulin regimen. No reported hypoglycemic episodes. -Increase Lantus dose to 70 units. -Advise patient to take Novolog three times a day with meals  -Referred to endocrine with upcoming appointment next month  Erectile Dysfunction Patient reports ongoing issues. Previously tried sildenafil with partial improvement. No contraindications to PDE5 inhibitors identified. -Prescribe sildenafil 100mg  as needed.  Hyperlipidemia No recent cholesterol check. -Check fasting lipid panel on 02/02/2024. -Continue statin  Screening for prostate cancer Last PSA checked in 2017 and was normal. -Check PSA on 02/02/2024.  History of amputation of toes of left foot, foot callus Patient reports improvement in foot health and has been seen by podiatrist. -Advise patient to continue monitoring foot health and follow up with podiatrist as needed.  Colon Cancer Screening Patient has not had a colonoscopy and is hesitant to complete stool test due to concerns about false positives. -Discuss colonoscopy at future visit.   Patient has not had recent eye exam. -Discuss ophthalmology referral at future visit once insurance  status is clarified.  Follow-up with endocrinologist scheduled for 02/17/2024.          Meds ordered this encounter  Medications   sildenafil (VIAGRA) 100 MG tablet    Sig: Take 1 tablet (100 mg total) by mouth daily as needed for erectile dysfunction. At least 24 hours between doses.    Dispense:  10 tablet    Refill:  5   Insulin Glargine (BASAGLAR KWIKPEN) 100 UNIT/ML  Sig: Inject 70 Units into the skin daily.    Dispense:  30 mL    Refill:  6    Dose increase    Follow-up: Return in about 3 months (around 04/25/2024) for Chronic medical conditions.       Hoy Register, MD, FAAFP. [facility] [place], [place] [phone]   01/26/2024, 9:36 AM

## 2024-01-27 ENCOUNTER — Other Ambulatory Visit: Payer: Self-pay

## 2024-01-30 ENCOUNTER — Other Ambulatory Visit (HOSPITAL_COMMUNITY): Payer: Self-pay

## 2024-02-02 ENCOUNTER — Telehealth (HOSPITAL_COMMUNITY): Payer: Self-pay | Admitting: Cardiology

## 2024-02-02 ENCOUNTER — Other Ambulatory Visit (HOSPITAL_COMMUNITY): Payer: Self-pay

## 2024-02-02 ENCOUNTER — Other Ambulatory Visit: Payer: Self-pay

## 2024-02-02 NOTE — Telephone Encounter (Signed)
Patient called to report he has been waiting two months on repatha refill unsure why its taking so long    Will forward to lipid clinic to review ?updated PA

## 2024-02-03 ENCOUNTER — Other Ambulatory Visit: Payer: Self-pay

## 2024-02-03 NOTE — Telephone Encounter (Signed)
 We had needed updated lab work for insurance but it looks like pt was not notified of this. He states that his insurance is being terminated and he is going to seek assistance through the drug company with the help from [place] [facility]. He will come by Friday for 2 samples.

## 2024-02-04 ENCOUNTER — Other Ambulatory Visit: Payer: Self-pay

## 2024-02-05 ENCOUNTER — Other Ambulatory Visit: Payer: Self-pay

## 2024-02-05 ENCOUNTER — Ambulatory Visit: Payer: 59 | Attending: Internal Medicine | Admitting: Internal Medicine

## 2024-02-05 VITALS — BP 98/72 | HR 75 | Temp 97.6°F | Ht 78.0 in | Wt 276.0 lb

## 2024-02-05 DIAGNOSIS — S0991XA Unspecified injury of ear, initial encounter: Secondary | ICD-10-CM | POA: Diagnosis not present

## 2024-02-05 MED ORDER — REPATHA 140 MG/ML ~~LOC~~ SOSY: 140.0000 mg | PREFILLED_SYRINGE | SUBCUTANEOUS | 0 refills | Status: DC

## 2024-02-05 NOTE — Telephone Encounter (Signed)
 Patient came to collect Repatha  samples. We had only 1 left in the stock so provided I auto injector.  Our [facility] office can spare one more sample. Patient has been informed to collect from [facility] on Monday.

## 2024-02-05 NOTE — Addendum Note (Signed)
 Addended by: Concetta Dee B on: 02/05/2024 10:08 AM   Modules accepted: Orders

## 2024-02-05 NOTE — Patient Instructions (Signed)
 Refer to Ear, Nose, and Throat (ENT) specialist for further evaluation and management. Try to avoid any further manipulation of the ear until seen by ENT specialist.

## 2024-02-05 NOTE — Progress Notes (Signed)
 Patient ID: [name] Glenn, male    DOB: 09/28/1971  MRN: [id number]  CC: Ear Injury (Blood in ear R yesterday & this morning)   Subjective: [name] is a 53 y.o. male who presents forUC visit. 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  History of Present Illness   The patient presents with right ear bleeding that began two days ago. He woke up with blood on his earlobe and suspected that he might have injured his ear with a Q-tip the previous night. He cleaned the ear with Q-tip and noticed a small amount of blood again this morning. He denies any pain but reports an itching sensation in the ear. He does not regularly use Q-tips to clean his ears, only about twice a month.      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) 01/20/2024   Neuropathy 11/28/2022   Erectile dysfunction 03/26/2021   Osteomyelitis, chronic, ankle or foot, left (HCC)    Hypersomnia 12/19/2020   CKD (chronic kidney disease), stage III (HCC) 06/22/2020   Testosterone  deficiency 11/29/2016   Chronic combined systolic and diastolic congestive heart failure (HCC) 11/13/2011   CAD 11/13/2011   Ischemic cardiomyopathy 11/04/2011   History of MI, acute, non ST segment elevation (HCC) 11/03/2011   Uncontrolled type 2 diabetes mellitus with hyperglycemia (HCC) 12/14/2010   Obesity 12/14/2010   Essential hypertension 12/14/2010     Current Outpatient Medications on File Prior to Visit  Medication Sig Dispense Refill   acetaminophen  (TYLENOL ) 500 MG tablet Take 500 mg by mouth every 6 (six) hours as needed.      amoxicillin -clavulanate (AUGMENTIN ) 875-125 MG tablet Take 1 tablet by mouth 2 (two) times daily for 7 days. 14 tablet 0   aspirin  81 MG chewable tablet Chew 1 tablet (81 mg total) by mouth daily. 30 tablet 6   atorvastatin  (LIPITOR ) 80 MG tablet Take 1 tablet (80 mg total) by mouth every evening. 90 tablet 1   Blood Glucose Monitoring Suppl (ONETOUCH VERIO) w/Device KIT Use to check blood sugar 3 times daily. 1 kit 0   carvedilol  (COREG ) 6.25 MG tablet Take 1 tablet (6.25 mg total) by mouth 2 (two) times daily with a meal. 180 tablet 1   dapagliflozin  propanediol (FARXIGA ) 10 MG TABS tablet Take 1 tablet (10 mg total) by mouth daily. 30 tablet 2   furosemide  (LASIX ) 40 MG tablet Take 1 tablet (40 mg total) by mouth daily. 90 tablet 1   glucose blood test strip Use to test blood sugar 3 times a day 100 each 0   Insulin  Glargine (BASAGLAR  KWIKPEN) 100 UNIT/ML Inject 70 Units into the skin daily. 30 mL 6   insulin  lispro (HUMALOG  KWIKPEN) 100 UNIT/ML KwikPen Inject 10 Units into the skin 3 (three) times daily before meals. Hold if blood sugar is less than 150 15 mL 11   levofloxacin  (LEVAQUIN ) 750 MG tablet Take 1 tablet (750 mg total) by mouth daily for 7 days 7 tablet 0   Misc. Devices MISC Diabetic shoes and inserts.  Fax to [phone] 1 each  0   omeprazole  (PRILOSEC) 20 MG capsule Take 1 capsule (20 mg total) by mouth daily. 90 capsule 1   sacubitril -valsartan  (ENTRESTO ) 97-103 MG Take 1 tablet by mouth 2 (two) times daily. 180 tablet 1   sildenafil  (VIAGRA ) 100 MG tablet Take 1 tablet (100 mg total) by mouth daily as needed for erectile dysfunction. At least 24 hours between doses. 10 tablet 5   spironolactone  (ALDACTONE ) 50 MG tablet Take 1 tablet (50 mg total) by mouth daily. 90 tablet 1   Evolocumab  (REPATHA  SURECLICK) 140 MG/ML SOAJ Inject 140 mg into the skin every 14 (fourteen) days. (Patient not taking: Reported on 02/05/2024) 2 mL 11   Current Facility-Administered Medications on File  Prior to Visit  Medication Dose Route Frequency Provider Last Rate Last Admin   vancomycin  (VANCOCIN ) powder    PRN Price, Michael J, DPM   1,000 mg at 01/24/21 1411    Allergies  Allergen Reactions   Ozempic  (0.25 Or 0.5 Mg-Dose) [Semaglutide (0.25 Or 0.5mg -Dos)] Other (See Comments)    Severe abdominal pain   Jardiance  [Empagliflozin ] Rash    Social History   Socioeconomic History   Marital status: Single    Spouse name: Not on file   Number of children: Not on file   Years of education: Not on file   Highest education level: Not on file  Occupational History   Not on file  Tobacco Use   Smoking status: Never   Smokeless tobacco: Never  Vaping Use   Vaping status: Never Used  Substance and Sexual Activity   Alcohol use: Not Currently    Alcohol/week: 1.0 standard drink of alcohol    Types: 1 Shots of liquor per week   Drug use: No   Sexual activity: Yes    Birth control/protection: None  Other Topics Concern   Not on file  Social History Narrative   Not on file   Social Drivers of Health   Financial Resource Strain: Low Risk  (01/26/2024)   Overall Financial Resource Strain (CARDIA)    Difficulty of Paying Living Expenses: Not hard at all  Food Insecurity: No Food Insecurity (01/26/2024)   Hunger Vital Sign    Worried About Running Out of Food in the Last Year: Never true    Ran Out of Food in the Last Year: Never true  Transportation Needs: No Transportation Needs (01/26/2024)   PRAPARE - Administrator, Civil Service (Medical): No    Lack of Transportation (Non-Medical): No  Physical Activity: Inactive (01/26/2024)   Exercise Vital Sign    Days of Exercise per Week: 0 days    Minutes of Exercise per Session: 0 min  Stress: No Stress Concern Present (01/26/2024)   [employer] of Occupational Health - Occupational Stress Questionnaire    Feeling of Stress : Not at all  Social Connections: Socially Isolated (01/26/2024)   Social Connection and  Isolation Panel [NHANES]    Frequency of Communication with Friends and Family: More than three times a week    Frequency of Social Gatherings with Friends and Family: Once a week    Attends Religious Services: Never    Database Administrator or Organizations: No    Attends Banker Meetings: Never    Marital Status: Never married  Intimate Partner Violence: Not At Risk (01/26/2024)   Humiliation, Afraid, Rape, and Kick questionnaire    Fear of Current or Ex-Partner: No    Emotionally Abused: No    Physically Abused:  No    Sexually Abused: No    Family History  Problem Relation Age of Onset   Heart disease Father    Heart disease Mother        MOTHER HAD CABG    Past Surgical History:  Procedure Laterality Date   AMPUTATION Left 06/23/2020   Procedure: AMPUTATION RAY 5th;  Surgeon: Gershon Donnice SAUNDERS, DPM;  [facility];  Service: Podiatry;  Laterality: Left;   BONE BIOPSY Left 12/20/2020   Procedure: BONE BIOPSY X 3;  Surgeon: Gretel Ozell PARAS, DPM;  Location: [facility];  Service: Podiatry;  Laterality: Left;   CARDIAC CATHETERIZATION N/A 10/25/2016   Procedure: Right/Left Heart Cath and Coronary Angiography;  Surgeon: Alm LELON Clay, MD;  Location: [facility] INVASIVE CV LAB;  Service: Cardiovascular;  Laterality: N/A;   CARDIAC CATHETERIZATION N/A 10/25/2016   Procedure: Coronary Stent Intervention;  Surgeon: Alm LELON Clay, MD;  Location: [facility] INVASIVE CV LAB;  Service: Cardiovascular;  Laterality: N/A;   CARDIAC CATHETERIZATION N/A 10/28/2016   Procedure: Coronary Stent Intervention;  Surgeon: Lonni JONETTA Cash, MD;  Location: [facility] INVASIVE CV LAB;  Service: Cardiovascular;  Laterality: N/A;   Feet surgery Bilateral    GRAFT APPLICATION Left 07/14/2020   Procedure: APPLICATION OF SKIN GRAFT USING INTEGRA BILAYER MATRIX WOUND DRESSING;  Surgeon: Gretel Ozell PARAS, DPM;  Location: [facility];  Service: Podiatry;  Laterality: Left;   I & D EXTREMITY Left 07/14/2020    Procedure: IRRIGATION AND DEBRIDEMENT LEFT FOOT;  Surgeon: Gretel Ozell PARAS, DPM;  Location: [facility];  Service: Podiatry;  Laterality: Left;   LEFT AND RIGHT HEART CATHETERIZATION WITH CORONARY ANGIOGRAM N/A 11/04/2011   Procedure: LEFT AND RIGHT HEART CATHETERIZATION WITH CORONARY ANGIOGRAM;  Surgeon: Lynwood Schilling, MD;  Location: [facility] CATH LAB;  Service: Cardiovascular;  Laterality: N/A;   METATARSAL HEAD EXCISION Left 01/24/2021   Procedure: 4th METATARSAL HEAD EXCISION, Proximal phalanx resection;  Surgeon: Gretel Ozell PARAS, DPM;  [facility];  Service: Podiatry;  Laterality: Left;   METATARSAL HEAD EXCISION Right 03/30/2021   Procedure: PROXIMAL 5TH TOE RESECTION ON RIGHT;  Surgeon: Gretel Ozell PARAS, DPM;  [facility];  Service: Podiatry;  Laterality: Right;   PERCUTANEOUS CORONARY STENT INTERVENTION (PCI-S)  11/04/2011   Procedure: PERCUTANEOUS CORONARY STENT INTERVENTION (PCI-S);  Surgeon: Ozell Fell, MD;  Location: [facility] CATH LAB;  Service: Cardiovascular;;   WOUND DEBRIDEMENT Left 06/26/2020   Procedure: LEFT FOOT WOUND DEBRIDEMENT AND GRAFT APPLICATION;  Surgeon: Gershon Donnice SAUNDERS, DPM;  Location[facility];  Service: Podiatry;  Laterality: Left;   WOUND DEBRIDEMENT Left 12/20/2020   Procedure: DEBRIDEMENT WOUND AND POSSIBLE CLOSURE;  Surgeon: Gretel Ozell PARAS, DPM;  [facility];  Service: Podiatry;  Laterality: Left;   WOUND DEBRIDEMENT Left 01/24/2021   Procedure: DEBRIDEMENT WOUND;  Surgeon: Gretel Ozell PARAS, DPM;  [facility];  Service: Podiatry;  Laterality: Left;   WOUND DEBRIDEMENT Bilateral 03/30/2021   Procedure: DEBRIDEMENT WOUND WITH SKIN GRAFT SUBSTITUTE APPLICATION;  Surgeon: Gretel Ozell PARAS, DPM;  [facility];  Service: Podiatry;  Laterality: Bilateral;   WOUND DEBRIDEMENT Right 04/20/2021   Procedure: DEBRIDEMENT ANDA SOS FOOT;  Surgeon: Gretel Ozell PARAS, DPM;  [facility];  Service: Podiatry;  Laterality: Right;    ROS: Review of  Systems Negative except as stated above  PHYSICAL EXAM: BP 98/72 (BP Location: Left Arm, Patient Position: Sitting, Cuff Size: Large)   Pulse 75   Temp 97.6 F (36.4 C) (Oral)   Ht 6' 6 (  1.981 m)   Wt 276 lb (125.2 kg)   SpO2 97%   BMI 31.90 kg/m   Physical Exam   General appearance - alert, well appearing, and in no distress Mental status - normal mood, behavior, speech, dress, motor activity, and thought processes Ears - LT ear canal and TM within nl limits.  RT ear canal has dried blood at the opening and blood ? Forming a clot mid canal obscuring view of the rest of the canal and TM.  No pain on palpation of the ear lobe.     Latest Ref Rng & Units 10/15/2023    4:08 PM 11/28/2022   11:33 AM 08/27/2022    3:37 PM  CMP  Glucose 70 - 99 mg/dL 710  816  668   BUN 6 - 24 mg/dL 37  25  31   Creatinine 0.76 - 1.27 mg/dL 8.45  8.64  8.49   Sodium 134 - 144 mmol/L 138  139  137   Potassium 3.5 - 5.2 mmol/L 4.0  4.8  5.3   Chloride 96 - 106 mmol/L 100  99  98   CO2 20 - 29 mmol/L 21  24  22    Calcium  8.7 - 10.2 mg/dL 9.7  89.8  9.6   Total Protein 6.0 - 8.5 g/dL 7.4   7.4   Total Bilirubin 0.0 - 1.2 mg/dL 0.4   0.5   Alkaline Phos 44 - 121 IU/L 82   77   AST 0 - 40 IU/L 12   16   ALT 0 - 44 IU/L 17   22    Lipid Panel     Component Value Date/Time   CHOL 184 08/27/2022 1537   TRIG 169 (H) 08/27/2022 1537   HDL 42 08/27/2022 1537   CHOLHDL 4.4 08/27/2022 1537   CHOLHDL 2.7 09/14/2021 0950   VLDL 19 09/14/2021 0950   LDLCALC 112 (H) 08/27/2022 1537   LDLDIRECT 117.0 06/14/2013 0826    CBC    Component Value Date/Time   WBC 9.0 08/27/2022 1537   WBC 11.1 (H) 04/20/2021 1550   RBC 5.22 08/27/2022 1537   RBC 4.67 04/20/2021 1550   HGB 14.5 08/27/2022 1537   HCT 44.2 08/27/2022 1537   PLT 223 08/27/2022 1537   MCV 85 08/27/2022 1537   MCH 27.8 08/27/2022 1537   MCH 25.9 (L) 04/20/2021 1550   MCHC 32.8 08/27/2022 1537   MCHC 32.2 04/20/2021 1550   RDW 12.2  08/27/2022 1537   LYMPHSABS 2.8 08/27/2022 1537   MONOABS 0.8 06/27/2020 0354   EOSABS 0.2 08/27/2022 1537   BASOSABS 0.1 08/27/2022 1537    ASSESSMENT AND PLAN: 1. Injury of ear, initial encounter (Primary) Right Ear Bleeding Acute onset of bleeding after cleaning with Q-tip. No pain, but reports itching. On examination, dried blood and clot formation noted, obscuring view of eardrum. -Refer to Ear, Nose, and Throat (ENT) specialist for further evaluation and management. -Advise patient to avoid any further manipulation of the ear until seen by ENT specialist.          Patient was given the opportunity to ask questions.  Patient verbalized understanding of the plan and was able to repeat key elements of the plan.   This documentation was completed using Paediatric nurse.  Any transcriptional errors are unintentional.  No orders of the defined types were placed in this encounter.    Requested Prescriptions    No prescriptions requested or ordered in this encounter  No follow-ups on file.  Barnie Louder, MD, FACP

## 2024-02-06 ENCOUNTER — Other Ambulatory Visit: Payer: 59

## 2024-02-06 ENCOUNTER — Other Ambulatory Visit: Payer: Self-pay

## 2024-02-06 DIAGNOSIS — H9221 Otorrhagia, right ear: Secondary | ICD-10-CM | POA: Diagnosis not present

## 2024-02-10 ENCOUNTER — Ambulatory Visit: Payer: 59 | Attending: Family Medicine

## 2024-02-10 DIAGNOSIS — N529 Male erectile dysfunction, unspecified: Secondary | ICD-10-CM | POA: Diagnosis not present

## 2024-02-10 DIAGNOSIS — E1165 Type 2 diabetes mellitus with hyperglycemia: Secondary | ICD-10-CM

## 2024-02-10 DIAGNOSIS — Z125 Encounter for screening for malignant neoplasm of prostate: Secondary | ICD-10-CM | POA: Diagnosis not present

## 2024-02-11 ENCOUNTER — Telehealth: Payer: Self-pay | Admitting: Pharmacy Technician

## 2024-02-11 ENCOUNTER — Other Ambulatory Visit: Payer: Self-pay

## 2024-02-11 NOTE — Telephone Encounter (Signed)
Pharmacy Patient Advocate Encounter   Received notification from Pt Calls Messages that prior authorization for Repatha is required/requested.   Insurance verification completed.   The patient is insured through  [employer]  .   Per test claim: PA required; PA submitted to above mentioned insurance via CoverMyMeds Key/confirmation #/EOC [id number] Status is pending

## 2024-02-12 ENCOUNTER — Encounter: Payer: Self-pay | Admitting: Family Medicine

## 2024-02-12 ENCOUNTER — Other Ambulatory Visit (HOSPITAL_COMMUNITY): Payer: Self-pay

## 2024-02-12 LAB — CMP14+EGFR
ALT: 21 [IU]/L (ref 0–44)
AST: 30 [IU]/L (ref 0–40)
Albumin: 4.1 g/dL (ref 4.1–5.1)
Alkaline Phosphatase: 58 [IU]/L (ref 44–121)
BUN/Creatinine Ratio: 16 (ref 9–20)
BUN: 20 mg/dL (ref 6–24)
Bilirubin Total: 0.4 mg/dL (ref 0.0–1.2)
CO2: 22 mmol/L (ref 20–29)
Calcium: 8.9 mg/dL (ref 8.7–10.2)
Chloride: 104 mmol/L (ref 96–106)
Creatinine, Ser: 1.27 mg/dL (ref 0.76–1.27)
Globulin, Total: 2.3 g/dL (ref 1.5–4.5)
Glucose: 197 mg/dL — ABNORMAL HIGH (ref 70–99)
Potassium: 4.3 mmol/L (ref 3.5–5.2)
Sodium: 140 mmol/L (ref 134–144)
Total Protein: 6.4 g/dL (ref 6.0–8.5)
eGFR: 69 mL/min/{1.73_m2} (ref >59)

## 2024-02-12 LAB — LP+NON-HDL CHOLESTEROL
Cholesterol, Total: 115 mg/dL (ref 100–199)
HDL: 42 mg/dL (ref >39)
LDL Chol Calc (NIH): 58 mg/dL (ref 0–99)
Total Non-HDL-Chol (LDL+VLDL): 73 mg/dL (ref 0–129)
Triglycerides: 71 mg/dL (ref 0–149)
VLDL Cholesterol Cal: 15 mg/dL (ref 5–40)

## 2024-02-12 LAB — PSA, TOTAL AND FREE
PSA, Free Pct: 45 %
PSA, Free: 0.45 ng/mL
Prostate Specific Ag, Serum: 1 ng/mL (ref 0.0–4.0)

## 2024-02-12 LAB — TESTOSTERONE, FREE/TOT EQUILIB
Testosterone, Free Pct: 2.51 % (ref 1.50–4.20)
Testosterone,Free: 10.89 ng/dL (ref 5.00–21.00)
Testosterone: 434 ng/dL (ref 264–916)

## 2024-02-12 LAB — MICROALBUMIN / CREATININE URINE RATIO: Creatinine, Urine: 40.2 mg/dL

## 2024-02-12 NOTE — Telephone Encounter (Signed)
Pharmacy Patient Advocate Encounter  Received notification from  [employer]  that Prior Authorization for Repatha has been APPROVED from 02/11/24 to 02/10/25. Ran test claim, Copay is $553.33- one month. This test claim was processed through [facility]- copay amounts may vary at other pharmacies due to pharmacy/plan contracts, or as the patient moves through the different stages of their insurance plan.   PA #/Case ID/Reference #: [id number]

## 2024-02-12 NOTE — Telephone Encounter (Signed)
Call and informed pt about PA approval.

## 2024-02-13 ENCOUNTER — Other Ambulatory Visit (HOSPITAL_COMMUNITY): Payer: Self-pay

## 2024-02-13 ENCOUNTER — Other Ambulatory Visit: Payer: Self-pay

## 2024-02-17 ENCOUNTER — Ambulatory Visit: Payer: 59 | Admitting: Internal Medicine

## 2024-02-19 ENCOUNTER — Other Ambulatory Visit: Payer: Self-pay

## 2024-02-19 ENCOUNTER — Telehealth: Payer: Self-pay

## 2024-02-19 NOTE — Telephone Encounter (Signed)
Copied from CRM [id number]. Topic: Clinical - Medication Question >> Feb 13, 2024  9:32 AM Florestine Avers wrote: Reason for CRM: Patient states that the pharmacist told him to come pick up sample Repatha pen, patient is requesting a call back to see if he can come get samples from the clinic.

## 2024-02-19 NOTE — Telephone Encounter (Signed)
Call to patient to advised that he will need to reach out to [facility] as they are the office who has been assisting him

## 2024-02-20 ENCOUNTER — Other Ambulatory Visit: Payer: Self-pay

## 2024-02-23 ENCOUNTER — Other Ambulatory Visit: Payer: Self-pay

## 2024-02-23 MED ORDER — REPATHA 140 MG/ML ~~LOC~~ SOSY: 140.0000 mg | PREFILLED_SYRINGE | SUBCUTANEOUS | 0 refills | Status: DC

## 2024-02-23 NOTE — Addendum Note (Signed)
 Addended by: Malena Peer D on: 02/23/2024 02:32 PM   Modules accepted: Orders

## 2024-03-01 ENCOUNTER — Encounter: Payer: 59 | Admitting: Cardiology

## 2024-03-01 ENCOUNTER — Other Ambulatory Visit: Payer: Self-pay | Admitting: Family Medicine

## 2024-03-01 ENCOUNTER — Other Ambulatory Visit: Payer: Self-pay

## 2024-03-01 MED ORDER — DAPAGLIFLOZIN PROPANEDIOL 10 MG PO TABS
10.0000 mg | ORAL_TABLET | Freq: Every day | ORAL | 2 refills | Status: DC
  Filled 2024-03-01: qty 15, 15d supply, fill #0

## 2024-03-02 ENCOUNTER — Other Ambulatory Visit: Payer: Self-pay

## 2024-03-03 ENCOUNTER — Other Ambulatory Visit: Payer: Self-pay

## 2024-03-03 ENCOUNTER — Other Ambulatory Visit (HOSPITAL_COMMUNITY): Payer: Self-pay

## 2024-03-05 ENCOUNTER — Other Ambulatory Visit: Payer: Self-pay

## 2024-03-08 ENCOUNTER — Telehealth: Payer: Self-pay

## 2024-03-08 ENCOUNTER — Other Ambulatory Visit: Payer: Self-pay

## 2024-03-08 ENCOUNTER — Other Ambulatory Visit (HOSPITAL_COMMUNITY): Payer: Self-pay

## 2024-03-08 NOTE — Telephone Encounter (Signed)
 Submitted application for FARXIGA to [place] for patient assistance.   Phone: [phone]  Submitted application for ENTRESTO 97/103 to [employer] for patient assistance.   Phone: [phone]

## 2024-03-09 ENCOUNTER — Other Ambulatory Visit: Payer: Self-pay

## 2024-03-10 ENCOUNTER — Other Ambulatory Visit (HOSPITAL_COMMUNITY): Payer: Self-pay

## 2024-03-10 ENCOUNTER — Other Ambulatory Visit: Payer: Self-pay

## 2024-03-10 ENCOUNTER — Telehealth: Payer: Self-pay | Admitting: Pharmacy Technician

## 2024-03-10 NOTE — Telephone Encounter (Signed)
 Scanned in media is the patient assistance form for repatha. Can someone please get the provider to sign the provider portion and fax it back to [place]? Its under (REPATHA PATIENT ASSISTANCE FORM NEED PROVIDER SIGN) Thank you. This is a Dr. Shirlee Latch patient

## 2024-03-10 NOTE — Telephone Encounter (Signed)
 The patient is calling -he took his last shot on 03/05/24 and is due 03/19/24. He no longer has insurance and has been approved for farxiga and entresto through patient assistance. I have mailed his forms for repatha patient assistance today. He is asking for samples.. please advise.[name]

## 2024-03-10 NOTE — Telephone Encounter (Signed)
 PAP: Patient assistance application for repatha through [employer] has been mailed to pt's home address on file. Provider portion of application will be faxed to provider's office.    Patient does not have insurance see other approvals

## 2024-03-10 NOTE — Telephone Encounter (Signed)
 Patient called and asked for me to email him the app, emailed app instead of mail

## 2024-03-11 ENCOUNTER — Other Ambulatory Visit (HOSPITAL_COMMUNITY): Payer: Self-pay

## 2024-03-11 ENCOUNTER — Telehealth (HOSPITAL_COMMUNITY): Payer: Self-pay

## 2024-03-11 ENCOUNTER — Encounter: Payer: Self-pay | Admitting: Pharmacy Technician

## 2024-03-11 NOTE — Telephone Encounter (Signed)
 Advanced Heart Failure Patient Advocate Encounter  Application for Repatha faxed to [employer] on 03/11/2024. Application form attached to patient chart.  Burnell Blanks, CPhT Rx Patient Advocate Phone: [phone]

## 2024-03-11 NOTE — Telephone Encounter (Signed)
 Patient was approved to receive Entresto from [employer] in a separate encounter.

## 2024-03-11 NOTE — Telephone Encounter (Signed)
 [name] taking over this one.

## 2024-03-11 NOTE — Telephone Encounter (Signed)
 Advanced Heart Failure Patient Advocate Encounter  Application for Entresto faxed to [employer] on 03/11/2024. Application form attached to patient chart.  Burnell Blanks, CPhT Rx Patient Advocate Phone: [phone]

## 2024-03-11 NOTE — Telephone Encounter (Signed)
Sent the pt a message 

## 2024-03-15 NOTE — Telephone Encounter (Signed)
 Patient was approved to receive Repatha from [employer] Effective 03/13/2024 to 03/12/2025

## 2024-03-22 ENCOUNTER — Other Ambulatory Visit: Payer: Self-pay

## 2024-03-22 ENCOUNTER — Telehealth: Payer: Self-pay | Admitting: Pharmacy Technician

## 2024-03-22 NOTE — Telephone Encounter (Signed)
  Received notification from [employer] regarding approval for [facility]. Patient assistance approved from 03/08/2024 to 03/08/2025.   Medication will ship to [address], [place], [address]   Pt ID: [id number]   Company phone: [phone]    Patient called and I gave him this info

## 2024-03-23 ENCOUNTER — Other Ambulatory Visit: Payer: Self-pay

## 2024-03-23 MED ORDER — REPATHA SURECLICK 140 MG/ML ~~LOC~~ SOAJ: 1.0000 mL | SUBCUTANEOUS | 0 refills | Status: DC

## 2024-04-02 ENCOUNTER — Other Ambulatory Visit: Payer: Self-pay

## 2024-04-05 ENCOUNTER — Other Ambulatory Visit: Payer: Self-pay

## 2024-04-05 ENCOUNTER — Other Ambulatory Visit: Payer: Self-pay | Admitting: Family Medicine

## 2024-04-05 DIAGNOSIS — E1169 Type 2 diabetes mellitus with other specified complication: Secondary | ICD-10-CM

## 2024-04-06 ENCOUNTER — Other Ambulatory Visit: Payer: Self-pay

## 2024-04-06 MED ORDER — TECHLITE PEN NEEDLES 31G X 5 MM MISC
2 refills | Status: AC
  Filled 2024-04-06: qty 400, fill #0
  Filled 2024-04-06 – 2024-10-27 (×2): qty 100, 25d supply, fill #0
  Filled 2024-10-27: qty 200, 50d supply, fill #0
  Filled 2024-12-27: qty 400, 90d supply, fill #0
  Filled 2024-12-31: qty 100, 25d supply, fill #0

## 2024-04-06 MED ORDER — INSULIN LISPRO (1 UNIT DIAL) 100 UNIT/ML (KWIKPEN)
10.0000 [IU] | PEN_INJECTOR | Freq: Three times a day (TID) | SUBCUTANEOUS | 0 refills | Status: DC
  Filled 2024-04-06: qty 15, 50d supply, fill #0
  Filled 2024-04-06: qty 9, 30d supply, fill #0
  Filled 2024-05-10: qty 9, 30d supply, fill #1
  Filled 2024-05-19: qty 6, 20d supply, fill #1

## 2024-04-06 NOTE — Telephone Encounter (Signed)
 Requested Prescriptions  Pending Prescriptions Disp Refills   insulin lispro (HUMALOG KWIKPEN) 100 UNIT/ML KwikPen 15 mL 0    Sig: Inject 10 Units into the skin 3 (three) times daily before meals. Hold if blood sugar is less than 150     Endocrinology:  Diabetes - Insulins Failed - 04/06/2024  2:03 PM      Failed - HBA1C is between 0 and 7.9 and within 180 days    HbA1c, POC (controlled diabetic range)  Date Value Ref Range Status  01/26/2024 12.1 (A) 0.0 - 7.0 % Final         Passed - Valid encounter within last 6 months    Recent Outpatient Visits           2 months ago Injury of ear, initial encounter   [facility] Of [place]. [facility] Marcine Matar, MD   2 months ago Vasculogenic erectile dysfunction, unspecified vasculogenic erectile dysfunction type   [place] [facility] - [facility] Dept Of [place]. [facility] Hoy Register, MD   2 months ago Uncontrolled type 2 diabetes mellitus with hyperglycemia [facility])   [facility] [facility] - A Dept Of [place]. [facility] Hoy Register, MD   5 months ago Uncontrolled type 2 diabetes mellitus with hyperglycemia [facility])   [place] [facility] - A Dept Of [place]. [facility] Hoy Register, MD   11 months ago Uncontrolled type 2 diabetes mellitus with hyperglycemia [facility])   [place] [facility] - A Dept Of [place]. [facility] Hoy Register, MD       Future Appointments             In 1 month Hoy Register, MD [facility] [place] - A Dept Of [place]. [facility]             Insulin Pen Needle (TECHLITE PEN NEEDLES) 31G X 5 MM MISC 100 each 6    Sig: USE TO INJECT INSULIN AT BEDTIME.     Endocrinology: Diabetes - Testing Supplies Passed - 04/06/2024  2:03 PM      Passed - Valid encounter within last 12 months    Recent Outpatient Visits           2 months ago Injury of ear,  initial encounter   [place] [facility] - [facility] Dept Of [place]. [facility] Marcine Matar, MD   2 months ago Vasculogenic erectile dysfunction, unspecified vasculogenic erectile dysfunction type   [place] [facility] - A Dept Of [place]. [facility] Hoy Register, MD   2 months ago Uncontrolled type 2 diabetes mellitus with hyperglycemia [facility])   [place] [facility] - A Dept Of [place]. [facility] Hoy Register, MD   5 months ago Uncontrolled type 2 diabetes mellitus with hyperglycemia [facility])   [place] [facility] - A Dept Of [place]. [facility] Hoy Register, MD   11 months ago Uncontrolled type 2 diabetes mellitus with hyperglycemia [facility])   [name] [facility] - A Dept Of [place]. [facility] Hoy Register, MD       Future Appointments             In 1 month Hoy Register, MD [facility] [place] - A Dept Of [place].  [facility]

## 2024-04-06 NOTE — Telephone Encounter (Signed)
 Requested medication (s) are due for refill today: routing for review  Requested medication (s) are on the active medication list: no  Last refill:  08/27/22  Future visit scheduled: yes  Notes to clinic:  Unable to refill per protocol, Rx expired. Not on current med list.      Requested Prescriptions  Pending Prescriptions Disp Refills   Insulin Pen Needle (TECHLITE PEN NEEDLES) 31G X 5 MM MISC 100 each 6    Sig: USE TO INJECT INSULIN AT BEDTIME.     Endocrinology: Diabetes - Testing Supplies Passed - 04/06/2024  2:04 PM      Passed - Valid encounter within last 12 months    Recent Outpatient Visits           2 months ago Injury of ear, initial encounter   [place] [facility] - [facility] Dept Of [place]. [facility] Marcine Matar, MD   2 months ago Vasculogenic erectile dysfunction, unspecified vasculogenic erectile dysfunction type   [place] [facility] - A Dept Of [place]. [facility] Hoy Register, MD   2 months ago Uncontrolled type 2 diabetes mellitus with hyperglycemia [facility])   [place] [facility] - A Dept Of [place]. [facility] Hoy Register, MD   5 months ago Uncontrolled type 2 diabetes mellitus with hyperglycemia [facility])   [place] [facility] - A Dept Of [place]. [facility] Hoy Register, MD   11 months ago Uncontrolled type 2 diabetes mellitus with hyperglycemia [facility])   [place] [facility] - A Dept Of [place]. [facility] Hoy Register, MD       Future Appointments             In 1 month Hoy Register, MD [facility] [place] - A Dept Of [place]. [facility]            Signed Prescriptions Disp Refills   insulin lispro (HUMALOG KWIKPEN) 100 UNIT/ML KwikPen 15 mL 0    Sig: Inject 10 Units into the skin 3 (three) times daily before meals. Hold if blood sugar is less than 150     Endocrinology:  Diabetes - Insulins  Failed - 04/06/2024  2:04 PM      Failed - HBA1C is between 0 and 7.9 and within 180 days    HbA1c, POC (controlled diabetic range)  Date Value Ref Range Status  01/26/2024 12.1 (A) 0.0 - 7.0 % Final         Passed - Valid encounter within last 6 months    Recent Outpatient Visits           2 months ago Injury of ear, initial encounter   [place] [facility] [place] - A Dept Of [place]. [facility] Marcine Matar, MD   2 months ago Vasculogenic erectile dysfunction, unspecified vasculogenic erectile dysfunction type   [place] [facility] - A Dept Of [place]. [facility] Hoy Register, MD   2 months ago Uncontrolled type 2 diabetes mellitus with hyperglycemia [facility])   [place] [facility] - A Dept Of [place]. [facility] Hoy Register, MD   5 months ago Uncontrolled type 2 diabetes mellitus with hyperglycemia [facility])    [facility] - A Dept Of . [facility] Hoy Register, MD   11 months ago Uncontrolled type 2 diabetes mellitus  with hyperglycemia [facility])   [place] [facility] - A Dept Of [place]. [facility] Hoy Register, MD       Future Appointments             In 1 month Hoy Register, MD [name] [place] - A Dept Of [place]. [facility]

## 2024-04-08 ENCOUNTER — Other Ambulatory Visit: Payer: Self-pay

## 2024-04-12 ENCOUNTER — Other Ambulatory Visit: Payer: Self-pay

## 2024-04-14 ENCOUNTER — Other Ambulatory Visit: Payer: Self-pay

## 2024-04-15 ENCOUNTER — Other Ambulatory Visit: Payer: Self-pay

## 2024-04-19 ENCOUNTER — Other Ambulatory Visit: Payer: Self-pay

## 2024-04-19 ENCOUNTER — Other Ambulatory Visit: Payer: Self-pay | Admitting: Family Medicine

## 2024-04-19 DIAGNOSIS — I509 Heart failure, unspecified: Secondary | ICD-10-CM

## 2024-04-19 MED ORDER — CARVEDILOL 6.25 MG PO TABS
6.2500 mg | ORAL_TABLET | Freq: Two times a day (BID) | ORAL | 0 refills | Status: DC
  Filled 2024-04-19: qty 60, 30d supply, fill #0
  Filled 2024-06-08: qty 60, 30d supply, fill #1
  Filled 2024-07-13: qty 60, 30d supply, fill #2

## 2024-04-26 ENCOUNTER — Encounter: Payer: Self-pay | Admitting: Cardiology

## 2024-04-26 ENCOUNTER — Other Ambulatory Visit: Payer: Self-pay

## 2024-04-26 ENCOUNTER — Ambulatory Visit: Payer: 59 | Admitting: Family Medicine

## 2024-05-05 ENCOUNTER — Other Ambulatory Visit: Payer: Self-pay

## 2024-05-10 ENCOUNTER — Other Ambulatory Visit: Payer: Self-pay | Admitting: Family Medicine

## 2024-05-10 ENCOUNTER — Other Ambulatory Visit: Payer: Self-pay

## 2024-05-10 DIAGNOSIS — E1169 Type 2 diabetes mellitus with other specified complication: Secondary | ICD-10-CM

## 2024-05-10 MED ORDER — ATORVASTATIN CALCIUM 80 MG PO TABS
80.0000 mg | ORAL_TABLET | Freq: Every evening | ORAL | 0 refills | Status: DC
  Filled 2024-05-10: qty 90, 90d supply, fill #0
  Filled 2024-05-10: qty 30, 30d supply, fill #0
  Filled 2024-06-08 – 2024-08-17 (×2): qty 90, 90d supply, fill #0
  Filled 2024-10-11: qty 30, 30d supply, fill #0
  Filled 2024-11-08: qty 30, 30d supply, fill #1

## 2024-05-10 MED ORDER — SPIRONOLACTONE 50 MG PO TABS
50.0000 mg | ORAL_TABLET | Freq: Every day | ORAL | 0 refills | Status: DC
  Filled 2024-05-10 – 2024-05-25 (×2): qty 90, 90d supply, fill #0

## 2024-05-11 NOTE — Telephone Encounter (Signed)
 Too soon for refill.  Requested Prescriptions  Pending Prescriptions Disp Refills   insulin  lispro (HUMALOG  KWIKPEN) 100 UNIT/ML KwikPen 15 mL 0    Sig: Inject 10 Units into the skin 3 (three) times daily before meals. Hold if blood sugar is less than 150     Endocrinology:  Diabetes - Insulins Failed - 05/11/2024  4:03 PM      Failed - HBA1C is between 0 and 7.9 and within 180 days    HbA1c, POC (controlled diabetic range)  Date Value Ref Range Status  01/26/2024 12.1 (A) 0.0 - 7.0 % Final         Passed - Valid encounter within last 6 months    Recent Outpatient Visits           3 months ago Injury of ear, initial encounter   [place] [facility] [place] - A Dept Of [place]. [facility] Lawrance Presume, MD   3 months ago Vasculogenic erectile dysfunction, unspecified vasculogenic erectile dysfunction type   [place] [facility] - A Dept Of [place]. [facility] Joaquin Mulberry, MD   3 months ago Uncontrolled type 2 diabetes mellitus with hyperglycemia [facility])   [place] [facility] - A Dept Of [place]. [facility] Joaquin Mulberry, MD   6 months ago Uncontrolled type 2 diabetes mellitus with hyperglycemia [facility])   [place] [facility] - A Dept Of [place]. [facility] Joaquin Mulberry, MD   1 year ago Uncontrolled type 2 diabetes mellitus with hyperglycemia [facility])   [place] [facility] - A Dept Of [place]. [facility] Joaquin Mulberry, MD       Future Appointments             In 2 weeks Joaquin Mulberry, MD [facility] - A Dept Of [place]. [facility]

## 2024-05-12 ENCOUNTER — Other Ambulatory Visit: Payer: Self-pay

## 2024-05-17 ENCOUNTER — Other Ambulatory Visit: Payer: Self-pay

## 2024-05-19 ENCOUNTER — Other Ambulatory Visit: Payer: Self-pay | Admitting: Family Medicine

## 2024-05-19 ENCOUNTER — Other Ambulatory Visit: Payer: Self-pay

## 2024-05-19 ENCOUNTER — Other Ambulatory Visit (HOSPITAL_COMMUNITY): Payer: Self-pay

## 2024-05-19 DIAGNOSIS — I255 Ischemic cardiomyopathy: Secondary | ICD-10-CM

## 2024-05-19 MED ORDER — FUROSEMIDE 40 MG PO TABS
40.0000 mg | ORAL_TABLET | Freq: Every day | ORAL | 1 refills | Status: DC
  Filled 2024-05-19: qty 90, 90d supply, fill #0
  Filled 2024-08-17 – 2024-08-31 (×2): qty 90, 90d supply, fill #1

## 2024-05-20 ENCOUNTER — Other Ambulatory Visit: Payer: Self-pay

## 2024-05-25 ENCOUNTER — Other Ambulatory Visit: Payer: Self-pay

## 2024-05-26 ENCOUNTER — Other Ambulatory Visit: Payer: Self-pay

## 2024-05-26 ENCOUNTER — Other Ambulatory Visit (HOSPITAL_BASED_OUTPATIENT_CLINIC_OR_DEPARTMENT_OTHER): Payer: Self-pay

## 2024-05-27 ENCOUNTER — Encounter: Payer: Self-pay | Admitting: Cardiology

## 2024-05-31 ENCOUNTER — Ambulatory Visit: Admitting: Family Medicine

## 2024-06-08 ENCOUNTER — Other Ambulatory Visit: Payer: Self-pay | Admitting: Family Medicine

## 2024-06-08 ENCOUNTER — Other Ambulatory Visit: Payer: Self-pay

## 2024-06-08 MED ORDER — INSULIN LISPRO (1 UNIT DIAL) 100 UNIT/ML (KWIKPEN)
10.0000 [IU] | PEN_INJECTOR | Freq: Three times a day (TID) | SUBCUTANEOUS | 0 refills | Status: DC
  Filled 2024-06-08: qty 9, 30d supply, fill #0
  Filled 2024-07-13: qty 6, 20d supply, fill #1

## 2024-06-09 ENCOUNTER — Other Ambulatory Visit (HOSPITAL_COMMUNITY): Payer: Self-pay

## 2024-06-15 ENCOUNTER — Other Ambulatory Visit: Payer: Self-pay

## 2024-06-15 MED ORDER — MUPIROCIN 2 % EX OINT
TOPICAL_OINTMENT | CUTANEOUS | 0 refills | Status: AC
  Filled 2024-06-15: qty 22, 10d supply, fill #0

## 2024-06-15 MED ORDER — DOXYCYCLINE HYCLATE 100 MG PO TABS
100.0000 mg | ORAL_TABLET | Freq: Two times a day (BID) | ORAL | 0 refills | Status: AC
  Filled 2024-06-15: qty 20, 10d supply, fill #0

## 2024-06-16 ENCOUNTER — Ambulatory Visit: Payer: Self-pay | Admitting: Podiatry

## 2024-06-24 ENCOUNTER — Encounter (HOSPITAL_BASED_OUTPATIENT_CLINIC_OR_DEPARTMENT_OTHER): Payer: Self-pay | Attending: General Surgery | Admitting: General Surgery

## 2024-06-24 ENCOUNTER — Other Ambulatory Visit: Payer: Self-pay

## 2024-06-24 DIAGNOSIS — I5042 Chronic combined systolic (congestive) and diastolic (congestive) heart failure: Secondary | ICD-10-CM | POA: Insufficient documentation

## 2024-06-24 DIAGNOSIS — E11621 Type 2 diabetes mellitus with foot ulcer: Secondary | ICD-10-CM | POA: Insufficient documentation

## 2024-06-24 DIAGNOSIS — L97512 Non-pressure chronic ulcer of other part of right foot with fat layer exposed: Secondary | ICD-10-CM | POA: Insufficient documentation

## 2024-06-30 ENCOUNTER — Encounter (HOSPITAL_BASED_OUTPATIENT_CLINIC_OR_DEPARTMENT_OTHER): Payer: Self-pay | Attending: General Surgery | Admitting: General Surgery

## 2024-06-30 ENCOUNTER — Ambulatory Visit: Payer: Self-pay | Admitting: Physician Assistant

## 2024-06-30 DIAGNOSIS — Z7984 Long term (current) use of oral hypoglycemic drugs: Secondary | ICD-10-CM | POA: Insufficient documentation

## 2024-06-30 DIAGNOSIS — Z794 Long term (current) use of insulin: Secondary | ICD-10-CM | POA: Insufficient documentation

## 2024-06-30 DIAGNOSIS — I110 Hypertensive heart disease with heart failure: Secondary | ICD-10-CM | POA: Insufficient documentation

## 2024-06-30 DIAGNOSIS — I2510 Atherosclerotic heart disease of native coronary artery without angina pectoris: Secondary | ICD-10-CM | POA: Insufficient documentation

## 2024-06-30 DIAGNOSIS — E11621 Type 2 diabetes mellitus with foot ulcer: Secondary | ICD-10-CM | POA: Insufficient documentation

## 2024-06-30 DIAGNOSIS — L97511 Non-pressure chronic ulcer of other part of right foot limited to breakdown of skin: Secondary | ICD-10-CM | POA: Insufficient documentation

## 2024-06-30 DIAGNOSIS — Z955 Presence of coronary angioplasty implant and graft: Secondary | ICD-10-CM | POA: Insufficient documentation

## 2024-06-30 DIAGNOSIS — I5042 Chronic combined systolic (congestive) and diastolic (congestive) heart failure: Secondary | ICD-10-CM | POA: Insufficient documentation

## 2024-06-30 DIAGNOSIS — L84 Corns and callosities: Secondary | ICD-10-CM | POA: Insufficient documentation

## 2024-06-30 DIAGNOSIS — L97512 Non-pressure chronic ulcer of other part of right foot with fat layer exposed: Secondary | ICD-10-CM | POA: Insufficient documentation

## 2024-07-01 ENCOUNTER — Other Ambulatory Visit: Payer: Self-pay

## 2024-07-07 ENCOUNTER — Encounter (HOSPITAL_BASED_OUTPATIENT_CLINIC_OR_DEPARTMENT_OTHER): Payer: Self-pay | Admitting: General Surgery

## 2024-07-09 ENCOUNTER — Encounter (HOSPITAL_BASED_OUTPATIENT_CLINIC_OR_DEPARTMENT_OTHER): Payer: Self-pay | Admitting: General Surgery

## 2024-07-13 ENCOUNTER — Encounter: Payer: Self-pay | Admitting: Pharmacist

## 2024-07-13 ENCOUNTER — Other Ambulatory Visit: Payer: Self-pay

## 2024-07-13 ENCOUNTER — Other Ambulatory Visit: Payer: Self-pay | Admitting: Family Medicine

## 2024-07-13 NOTE — Telephone Encounter (Unsigned)
 Copied from CRM [id number]. Topic: Clinical - Medication Refill >> Jul 13, 2024  3:09 PM Yolanda T wrote: Medication: omeprazole  (PRILOSEC) 20 MG capsule  Has the patient contacted their pharmacy? Yes  This is the patient's preferred pharmacy:  [facility] [address], [address] [place] [address] Phone: [phone] Fax: [phone]  Is this the correct pharmacy for this prescription? Yes If no, delete pharmacy and type the correct one.   Has the prescription been filled recently? Yes  Is the patient out of the medication? No  Has the patient been seen for an appointment in the last year OR does the patient have an upcoming appointment? Yes  Can we respond through MyChart? No  Agent: Please be advised that Rx refills may take up to 3 business days. We ask that you follow-up with your pharmacy.

## 2024-07-14 ENCOUNTER — Encounter (HOSPITAL_BASED_OUTPATIENT_CLINIC_OR_DEPARTMENT_OTHER): Payer: Self-pay | Admitting: General Surgery

## 2024-07-15 ENCOUNTER — Other Ambulatory Visit: Payer: Self-pay

## 2024-07-15 MED ORDER — OMEPRAZOLE 20 MG PO CPDR
20.0000 mg | DELAYED_RELEASE_CAPSULE | Freq: Every day | ORAL | 0 refills | Status: DC
  Filled 2024-07-15 – 2024-07-19 (×2): qty 90, 90d supply, fill #0

## 2024-07-15 NOTE — Telephone Encounter (Signed)
 LOV 02/05/2024  Has upcoming appt 08/02/2024 with Dr Newlin.  Criteria met.  Requested Prescriptions  Pending Prescriptions Disp Refills   omeprazole  (PRILOSEC) 20 MG capsule 90 capsule 1    Sig: Take 1 capsule (20 mg total) by mouth daily.     Gastroenterology: Proton Pump Inhibitors Passed - 07/15/2024 10:58 AM      Passed - Valid encounter within last 12 months    Recent Outpatient Visits           5 months ago Injury of ear, initial encounter   [place] [facility] - A Dept Of [place]. [facility] Vicci Barnie NOVAK, MD   5 months ago Vasculogenic erectile dysfunction, unspecified vasculogenic erectile dysfunction type   [place] [facility] - A Dept Of [place]. [facility] Delbert Clam, MD   5 months ago Uncontrolled type 2 diabetes mellitus with hyperglycemia [facility])   [place] [facility] [place] - A Dept Of [place]. [facility] Delbert Clam, MD   9 months ago Uncontrolled type 2 diabetes mellitus with hyperglycemia [facility])   [place] [facility] - A Dept Of [place]. [facility] Delbert Clam, MD   1 year ago Uncontrolled type 2 diabetes mellitus with hyperglycemia [facility])   [place] [facility] - A Dept Of [place]. [facility] Delbert Clam, MD

## 2024-07-19 ENCOUNTER — Other Ambulatory Visit: Payer: Self-pay

## 2024-07-19 ENCOUNTER — Other Ambulatory Visit (HOSPITAL_COMMUNITY): Payer: Self-pay

## 2024-07-21 ENCOUNTER — Encounter (HOSPITAL_BASED_OUTPATIENT_CLINIC_OR_DEPARTMENT_OTHER): Payer: Self-pay | Admitting: General Surgery

## 2024-07-22 ENCOUNTER — Other Ambulatory Visit: Payer: Self-pay

## 2024-07-23 ENCOUNTER — Other Ambulatory Visit: Payer: Self-pay

## 2024-07-28 ENCOUNTER — Encounter (HOSPITAL_BASED_OUTPATIENT_CLINIC_OR_DEPARTMENT_OTHER): Payer: Self-pay | Admitting: General Surgery

## 2024-07-28 ENCOUNTER — Other Ambulatory Visit: Payer: Self-pay

## 2024-07-28 MED ORDER — AMOXICILLIN-POT CLAVULANATE 875-125 MG PO TABS
1.0000 | ORAL_TABLET | Freq: Two times a day (BID) | ORAL | 0 refills | Status: AC
Start: 2024-07-28 — End: 2024-08-07
  Filled 2024-07-28: qty 20, 10d supply, fill #0

## 2024-07-30 ENCOUNTER — Encounter (HOSPITAL_BASED_OUTPATIENT_CLINIC_OR_DEPARTMENT_OTHER): Payer: Self-pay | Attending: General Surgery | Admitting: General Surgery

## 2024-07-30 DIAGNOSIS — L97512 Non-pressure chronic ulcer of other part of right foot with fat layer exposed: Secondary | ICD-10-CM | POA: Insufficient documentation

## 2024-07-30 DIAGNOSIS — E11621 Type 2 diabetes mellitus with foot ulcer: Secondary | ICD-10-CM | POA: Insufficient documentation

## 2024-07-30 DIAGNOSIS — I5042 Chronic combined systolic (congestive) and diastolic (congestive) heart failure: Secondary | ICD-10-CM | POA: Insufficient documentation

## 2024-08-02 ENCOUNTER — Ambulatory Visit: Payer: Self-pay | Admitting: Family Medicine

## 2024-08-04 ENCOUNTER — Encounter (HOSPITAL_BASED_OUTPATIENT_CLINIC_OR_DEPARTMENT_OTHER): Payer: Self-pay | Admitting: General Surgery

## 2024-08-05 ENCOUNTER — Other Ambulatory Visit: Payer: Self-pay

## 2024-08-05 ENCOUNTER — Other Ambulatory Visit: Payer: Self-pay | Admitting: Family Medicine

## 2024-08-05 MED ORDER — INSULIN LISPRO (1 UNIT DIAL) 100 UNIT/ML (KWIKPEN)
10.0000 [IU] | PEN_INJECTOR | Freq: Three times a day (TID) | SUBCUTANEOUS | 0 refills | Status: DC
Start: 2024-08-05 — End: 2024-10-18
  Filled 2024-08-05 – 2024-08-09 (×2): qty 15, 50d supply, fill #0

## 2024-08-09 ENCOUNTER — Other Ambulatory Visit: Payer: Self-pay

## 2024-08-11 ENCOUNTER — Encounter (HOSPITAL_BASED_OUTPATIENT_CLINIC_OR_DEPARTMENT_OTHER): Payer: Self-pay | Admitting: General Surgery

## 2024-08-17 ENCOUNTER — Other Ambulatory Visit: Payer: Self-pay

## 2024-08-17 ENCOUNTER — Other Ambulatory Visit (HOSPITAL_COMMUNITY): Payer: Self-pay

## 2024-08-17 ENCOUNTER — Other Ambulatory Visit: Payer: Self-pay | Admitting: Family Medicine

## 2024-08-17 DIAGNOSIS — I509 Heart failure, unspecified: Secondary | ICD-10-CM

## 2024-08-17 MED ORDER — CARVEDILOL 6.25 MG PO TABS
6.2500 mg | ORAL_TABLET | Freq: Two times a day (BID) | ORAL | 0 refills | Status: DC
  Filled 2024-08-17: qty 180, 90d supply, fill #0

## 2024-08-17 MED ORDER — SPIRONOLACTONE 50 MG PO TABS
50.0000 mg | ORAL_TABLET | Freq: Every day | ORAL | 0 refills | Status: DC
  Filled 2024-08-17: qty 90, 90d supply, fill #0

## 2024-08-18 ENCOUNTER — Encounter (HOSPITAL_BASED_OUTPATIENT_CLINIC_OR_DEPARTMENT_OTHER): Payer: Self-pay | Admitting: General Surgery

## 2024-08-25 ENCOUNTER — Encounter (HOSPITAL_BASED_OUTPATIENT_CLINIC_OR_DEPARTMENT_OTHER): Payer: Self-pay | Admitting: General Surgery

## 2024-08-31 ENCOUNTER — Other Ambulatory Visit: Payer: Self-pay

## 2024-09-01 ENCOUNTER — Other Ambulatory Visit: Payer: Self-pay

## 2024-09-01 ENCOUNTER — Encounter (HOSPITAL_BASED_OUTPATIENT_CLINIC_OR_DEPARTMENT_OTHER): Payer: Self-pay | Attending: General Surgery | Admitting: General Surgery

## 2024-09-01 ENCOUNTER — Ambulatory Visit: Payer: Self-pay | Admitting: Family Medicine

## 2024-09-01 DIAGNOSIS — E11621 Type 2 diabetes mellitus with foot ulcer: Secondary | ICD-10-CM | POA: Insufficient documentation

## 2024-09-01 DIAGNOSIS — E1122 Type 2 diabetes mellitus with diabetic chronic kidney disease: Secondary | ICD-10-CM | POA: Insufficient documentation

## 2024-09-01 DIAGNOSIS — Z955 Presence of coronary angioplasty implant and graft: Secondary | ICD-10-CM | POA: Insufficient documentation

## 2024-09-01 DIAGNOSIS — L97512 Non-pressure chronic ulcer of other part of right foot with fat layer exposed: Secondary | ICD-10-CM | POA: Insufficient documentation

## 2024-09-01 DIAGNOSIS — Z7984 Long term (current) use of oral hypoglycemic drugs: Secondary | ICD-10-CM | POA: Insufficient documentation

## 2024-09-01 DIAGNOSIS — Z794 Long term (current) use of insulin: Secondary | ICD-10-CM | POA: Insufficient documentation

## 2024-09-01 DIAGNOSIS — I5042 Chronic combined systolic (congestive) and diastolic (congestive) heart failure: Secondary | ICD-10-CM | POA: Insufficient documentation

## 2024-09-01 DIAGNOSIS — L84 Corns and callosities: Secondary | ICD-10-CM | POA: Insufficient documentation

## 2024-09-07 ENCOUNTER — Other Ambulatory Visit: Payer: Self-pay

## 2024-09-08 ENCOUNTER — Encounter (HOSPITAL_BASED_OUTPATIENT_CLINIC_OR_DEPARTMENT_OTHER): Payer: Self-pay | Admitting: General Surgery

## 2024-09-15 ENCOUNTER — Encounter (HOSPITAL_BASED_OUTPATIENT_CLINIC_OR_DEPARTMENT_OTHER): Payer: Self-pay | Admitting: General Surgery

## 2024-09-22 ENCOUNTER — Encounter (HOSPITAL_BASED_OUTPATIENT_CLINIC_OR_DEPARTMENT_OTHER): Payer: Self-pay | Admitting: General Surgery

## 2024-09-30 ENCOUNTER — Other Ambulatory Visit: Payer: Self-pay

## 2024-10-02 ENCOUNTER — Encounter: Payer: Self-pay | Admitting: Family Medicine

## 2024-10-05 NOTE — Telephone Encounter (Signed)
 Noted

## 2024-10-07 ENCOUNTER — Other Ambulatory Visit: Payer: Self-pay | Admitting: Internal Medicine

## 2024-10-07 MED ORDER — AMOXICILLIN-POT CLAVULANATE 875-125 MG PO TABS: 1.0000 | ORAL_TABLET | Freq: Two times a day (BID) | ORAL | 0 refills | Status: DC

## 2024-10-11 ENCOUNTER — Other Ambulatory Visit: Payer: Self-pay

## 2024-10-11 ENCOUNTER — Other Ambulatory Visit: Payer: Self-pay | Admitting: Family Medicine

## 2024-10-13 ENCOUNTER — Encounter (HOSPITAL_BASED_OUTPATIENT_CLINIC_OR_DEPARTMENT_OTHER): Payer: Self-pay | Attending: General Surgery | Admitting: General Surgery

## 2024-10-13 DIAGNOSIS — I5042 Chronic combined systolic (congestive) and diastolic (congestive) heart failure: Secondary | ICD-10-CM | POA: Insufficient documentation

## 2024-10-13 DIAGNOSIS — E11621 Type 2 diabetes mellitus with foot ulcer: Secondary | ICD-10-CM | POA: Insufficient documentation

## 2024-10-13 DIAGNOSIS — L97512 Non-pressure chronic ulcer of other part of right foot with fat layer exposed: Secondary | ICD-10-CM | POA: Insufficient documentation

## 2024-10-18 ENCOUNTER — Ambulatory Visit: Payer: Self-pay | Admitting: Family Medicine

## 2024-10-18 ENCOUNTER — Telehealth: Payer: Self-pay | Admitting: Family Medicine

## 2024-10-18 ENCOUNTER — Other Ambulatory Visit: Payer: Self-pay | Admitting: Family Medicine

## 2024-10-18 ENCOUNTER — Other Ambulatory Visit: Payer: Self-pay

## 2024-10-18 MED ORDER — INSULIN LISPRO (1 UNIT DIAL) 100 UNIT/ML (KWIKPEN)
10.0000 [IU] | PEN_INJECTOR | Freq: Three times a day (TID) | SUBCUTANEOUS | 0 refills | Status: DC
  Filled 2024-10-18: qty 9, 30d supply, fill #0

## 2024-10-18 MED ORDER — OMEPRAZOLE 20 MG PO CPDR
20.0000 mg | DELAYED_RELEASE_CAPSULE | Freq: Every day | ORAL | 0 refills | Status: DC
  Filled 2024-10-18: qty 30, 30d supply, fill #0

## 2024-10-18 NOTE — Telephone Encounter (Signed)
 Patient no showed appointment today, he is rescheduled to 11/23/24, he is requesting refill

## 2024-10-18 NOTE — Telephone Encounter (Signed)
 Pt requesting refill on insulin  10 unit and  omeprazole .

## 2024-10-19 ENCOUNTER — Other Ambulatory Visit: Payer: Self-pay

## 2024-10-19 MED ORDER — INSULIN LISPRO (1 UNIT DIAL) 100 UNIT/ML (KWIKPEN)
10.0000 [IU] | PEN_INJECTOR | Freq: Three times a day (TID) | SUBCUTANEOUS | 0 refills | Status: DC
  Filled 2024-11-22: qty 9, 30d supply, fill #0
  Filled 2024-12-21: qty 9, 30d supply, fill #1

## 2024-10-19 MED ORDER — OMEPRAZOLE 20 MG PO CPDR
20.0000 mg | DELAYED_RELEASE_CAPSULE | Freq: Every day | ORAL | 0 refills | Status: DC
  Filled 2024-11-22: qty 30, 30d supply, fill #0

## 2024-10-19 NOTE — Telephone Encounter (Signed)
 Patient has been informed.

## 2024-10-19 NOTE — Telephone Encounter (Signed)
Prescriptions have been refilled.  

## 2024-10-19 NOTE — Addendum Note (Signed)
 Addended by: Kristyana Notte on: 10/19/2024 01:24 PM   Modules accepted: Orders

## 2024-10-20 ENCOUNTER — Encounter (HOSPITAL_BASED_OUTPATIENT_CLINIC_OR_DEPARTMENT_OTHER): Payer: Self-pay | Admitting: General Surgery

## 2024-10-20 ENCOUNTER — Other Ambulatory Visit: Payer: Self-pay

## 2024-10-21 ENCOUNTER — Other Ambulatory Visit: Payer: Self-pay

## 2024-10-27 ENCOUNTER — Encounter (HOSPITAL_BASED_OUTPATIENT_CLINIC_OR_DEPARTMENT_OTHER): Payer: Self-pay | Admitting: General Surgery

## 2024-10-27 ENCOUNTER — Other Ambulatory Visit (HOSPITAL_COMMUNITY): Payer: Self-pay

## 2024-10-27 ENCOUNTER — Other Ambulatory Visit: Payer: Self-pay

## 2024-11-04 ENCOUNTER — Encounter (HOSPITAL_BASED_OUTPATIENT_CLINIC_OR_DEPARTMENT_OTHER): Payer: Self-pay | Attending: General Surgery | Admitting: General Surgery

## 2024-11-04 DIAGNOSIS — B9561 Methicillin susceptible Staphylococcus aureus infection as the cause of diseases classified elsewhere: Secondary | ICD-10-CM | POA: Insufficient documentation

## 2024-11-04 DIAGNOSIS — L97512 Non-pressure chronic ulcer of other part of right foot with fat layer exposed: Secondary | ICD-10-CM | POA: Insufficient documentation

## 2024-11-04 DIAGNOSIS — I5042 Chronic combined systolic (congestive) and diastolic (congestive) heart failure: Secondary | ICD-10-CM | POA: Insufficient documentation

## 2024-11-04 DIAGNOSIS — L84 Corns and callosities: Secondary | ICD-10-CM | POA: Insufficient documentation

## 2024-11-04 DIAGNOSIS — B9689 Other specified bacterial agents as the cause of diseases classified elsewhere: Secondary | ICD-10-CM | POA: Insufficient documentation

## 2024-11-04 DIAGNOSIS — Z955 Presence of coronary angioplasty implant and graft: Secondary | ICD-10-CM | POA: Insufficient documentation

## 2024-11-04 DIAGNOSIS — E11621 Type 2 diabetes mellitus with foot ulcer: Secondary | ICD-10-CM | POA: Insufficient documentation

## 2024-11-04 DIAGNOSIS — E1122 Type 2 diabetes mellitus with diabetic chronic kidney disease: Secondary | ICD-10-CM | POA: Insufficient documentation

## 2024-11-04 DIAGNOSIS — B952 Enterococcus as the cause of diseases classified elsewhere: Secondary | ICD-10-CM | POA: Insufficient documentation

## 2024-11-04 DIAGNOSIS — B9683 Acinetobacter baumannii as the cause of diseases classified elsewhere: Secondary | ICD-10-CM | POA: Insufficient documentation

## 2024-11-04 DIAGNOSIS — Z794 Long term (current) use of insulin: Secondary | ICD-10-CM | POA: Insufficient documentation

## 2024-11-04 DIAGNOSIS — N189 Chronic kidney disease, unspecified: Secondary | ICD-10-CM | POA: Insufficient documentation

## 2024-11-04 DIAGNOSIS — I130 Hypertensive heart and chronic kidney disease with heart failure and stage 1 through stage 4 chronic kidney disease, or unspecified chronic kidney disease: Secondary | ICD-10-CM | POA: Insufficient documentation

## 2024-11-04 DIAGNOSIS — B961 Klebsiella pneumoniae [K. pneumoniae] as the cause of diseases classified elsewhere: Secondary | ICD-10-CM | POA: Insufficient documentation

## 2024-11-04 DIAGNOSIS — Z7984 Long term (current) use of oral hypoglycemic drugs: Secondary | ICD-10-CM | POA: Insufficient documentation

## 2024-11-08 ENCOUNTER — Other Ambulatory Visit: Payer: Self-pay

## 2024-11-08 MED ORDER — LEVOFLOXACIN 750 MG PO TABS
750.0000 mg | ORAL_TABLET | Freq: Every day | ORAL | 0 refills | Status: AC
  Filled 2024-11-08: qty 14, 14d supply, fill #0

## 2024-11-09 ENCOUNTER — Other Ambulatory Visit (HOSPITAL_COMMUNITY): Payer: Self-pay

## 2024-11-11 ENCOUNTER — Encounter (HOSPITAL_BASED_OUTPATIENT_CLINIC_OR_DEPARTMENT_OTHER): Payer: Self-pay | Admitting: General Surgery

## 2024-11-18 ENCOUNTER — Encounter (HOSPITAL_BASED_OUTPATIENT_CLINIC_OR_DEPARTMENT_OTHER): Payer: Self-pay | Admitting: General Surgery

## 2024-11-22 ENCOUNTER — Other Ambulatory Visit: Payer: Self-pay

## 2024-11-22 ENCOUNTER — Other Ambulatory Visit: Payer: Self-pay | Admitting: Family Medicine

## 2024-11-22 DIAGNOSIS — I255 Ischemic cardiomyopathy: Secondary | ICD-10-CM

## 2024-11-22 DIAGNOSIS — E1165 Type 2 diabetes mellitus with hyperglycemia: Secondary | ICD-10-CM

## 2024-11-22 MED ORDER — BASAGLAR KWIKPEN 100 UNIT/ML ~~LOC~~ SOPN
70.0000 [IU] | PEN_INJECTOR | Freq: Every day | SUBCUTANEOUS | 0 refills | Status: DC
  Filled 2024-11-22: qty 21, 30d supply, fill #0
  Filled 2025-01-10: qty 21, 30d supply, fill #1
  Filled 2025-01-11: qty 9, 12d supply, fill #1

## 2024-11-22 MED ORDER — SPIRONOLACTONE 50 MG PO TABS
50.0000 mg | ORAL_TABLET | Freq: Every day | ORAL | 0 refills | Status: DC
  Filled 2024-11-22: qty 90, 90d supply, fill #0

## 2024-11-22 MED ORDER — FUROSEMIDE 40 MG PO TABS
40.0000 mg | ORAL_TABLET | Freq: Every day | ORAL | 0 refills | Status: DC
  Filled 2024-11-22: qty 90, 90d supply, fill #0

## 2024-11-23 ENCOUNTER — Ambulatory Visit (HOSPITAL_COMMUNITY): Payer: Self-pay

## 2024-11-23 ENCOUNTER — Ambulatory Visit: Payer: Self-pay | Admitting: Family Medicine

## 2024-11-23 ENCOUNTER — Ambulatory Visit: Payer: Self-pay | Admitting: Podiatry

## 2024-11-24 ENCOUNTER — Other Ambulatory Visit: Payer: Self-pay

## 2024-11-24 ENCOUNTER — Encounter (HOSPITAL_BASED_OUTPATIENT_CLINIC_OR_DEPARTMENT_OTHER): Payer: Self-pay | Admitting: General Surgery

## 2024-11-24 MED ORDER — MUPIROCIN 2 % EX OINT
1.0000 | TOPICAL_OINTMENT | Freq: Every day | CUTANEOUS | 0 refills | Status: AC
  Filled 2024-11-24: qty 22, 22d supply, fill #0

## 2024-11-24 MED ORDER — GENTAMICIN SULFATE 0.1 % EX OINT
1.0000 | TOPICAL_OINTMENT | Freq: Every day | CUTANEOUS | 0 refills | Status: DC
  Filled 2024-11-24: qty 30, 30d supply, fill #0

## 2024-11-29 ENCOUNTER — Ambulatory Visit: Payer: Self-pay | Admitting: Family Medicine

## 2024-11-29 ENCOUNTER — Telehealth (HOSPITAL_COMMUNITY): Payer: Self-pay

## 2024-11-29 NOTE — Progress Notes (Incomplete)
 Advanced [facility] Failure Clinic   PCP: Dr. Seena Cardiology: Dr. Lavona HF Cardiology: Dr. Rolan Barters [name] is a 53 y.o. male with history of CAD and ischemic cardiomyopathy.  He initially had NSTEMI in 10/12 with DES to proximal LAD. At that time, EF was noted to be down to 15%.  Over time, EF improved to 50% (3/13 echo).    Echo in 10/17 showed EF back down to 10% with severe functional MR.  He was taken for cardiac cath.  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  He had DES x 2 to LCx/OM2 and had a staged procedure with DES to distal RCA/ostial PDA.  RHC showed elevated filling pressure and low cardiac output.  While in the hospital, he was on milrinone  gtt and was diuresed.  Eventually, we were able to titrate him off milrinone  and discharged him home on a po regimen.   Echo in 8/20 showed EF in the 35% range with trivial MR.    He had amputation of left 5th toe due to osteomyelitis.   Echo in 9/22 showed EF 40-45% with moderate LVH.  ABIs in 9/22 were normal.   Lost to follow up. Last seen in AHF clinic 08/2021.  Today he returns for HF follow up. Overall feeling fine. Follows closely with [facility] for R foot ulcer, needs foot surgery, will see Podiatry at [facility] soon to discuss. No SOB with activity. Denies palpitations, abnormal bleeding, CP, dizziness, edema, or PND/Orthopnea. Appetite ok. Weight at home 280 pounds. Taking all medications. 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 Has CPAP but cannot tolerate it.  No tobacco or drugs, rare ETOH.   He is uninsured, by choice.   ReDs reading: 27 %, normal  ECG (personally reviewed): NSR 87 bpm  Labs (7/19): K 4.1, creatinine 1.5 Labs (9/20): LDL 114 Labs (1/21): K 4, creatinine 1.3 Labs (12/21): LDL 45 Labs (4/22): K 4.3, creatinine 1.19 Labs (6/22): BNP 49.5, K 4.3, creatinine 1.19 Labs (1/25): A1C 12.1 Labs (2/25): K 4.3, creatinine 1.27,  LDL 58  Review of systems complete and found to be negative unless listed in HPI.   PMH:  1. CAD: NSTEMI 10/12 with DES to proximal LAD.  - LHC (10/25/16): subtotal occlusion LCx/OM2, 80% dRCA/ostial PCA => DES x 2 to LCx/OM2.  On 10/30, staged procedure with DES to dRCA/ostial PDA.   2. Chronic systolic CHF: Ischemic cardiomyopathy.   - 11/12 echo with EF 15%. - Echo (3/13) with EF 50% - Echo (10/16) with EF 35-40% - Echo (10/17) with EF 10%, severe MR (appears functional).  - RHC (10/17) with mean RA 20, PA 73/36, mean PCWP 28, CI 1.5 Fick/1.74 thermo - Echo (8/20) with EF 35% by my review, trivial MR.  - Echo (9/22): EF 40-45%, moderate LVH.  3. Type II diabetes 4. HTN 5. Hyperlipidemia 6. Obesity 7. OSA: Suspected.  8. 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  9. Charcot joint right foot.  10. ABIs normal in 9/22.   Social History   Socioeconomic History   Marital status: Single    Spouse name: Not on file   Number of children: Not on file   Years of education: Not on file   Highest education level: Not on file  Occupational History   Not on file  Tobacco Use   Smoking status: Never   Smokeless tobacco: Never  Vaping Use   Vaping  status: Never Used  Substance and Sexual Activity   Alcohol use: Not Currently    Alcohol/week: 1.0 standard drink of alcohol    Types: 1 Shots of liquor per week   Drug use: No   Sexual activity: Yes    Birth control/protection: None  Other Topics Concern   Not on file  Social History Narrative   Not on file   Social Drivers of Health   Financial Resource Strain: Low Risk  (01/26/2024)   Overall Financial Resource Strain (CARDIA)    Difficulty of Paying Living Expenses: Not hard at all  Food Insecurity: No Food Insecurity (01/26/2024)   Hunger Vital Sign    Worried About Running Out of Food in the Last Year: Never true    Ran Out of Food in the Last Year: Never true  Transportation Needs: No Transportation Needs  (01/26/2024)   PRAPARE - Administrator, Civil Service (Medical): No    Lack of Transportation (Non-Medical): No  Physical Activity: Inactive (01/26/2024)   Exercise Vital Sign    Days of Exercise per Week: 0 days    Minutes of Exercise per Session: 0 min  Stress: No Stress Concern Present (01/26/2024)   [employer] of Occupational Health - Occupational Stress Questionnaire    Feeling of Stress : Not at all  Social Connections: Socially Isolated (01/26/2024)   Social Connection and Isolation Panel    Frequency of Communication with Friends and Family: More than three times a week    Frequency of Social Gatherings with Friends and Family: Once a week    Attends Religious Services: Never    Database Administrator or Organizations: No    Attends Banker Meetings: Never    Marital Status: Never married  Intimate Partner Violence: Not At Risk (01/26/2024)   Humiliation, Afraid, Rape, and Kick questionnaire    Fear of Current or Ex-Partner: No    Emotionally Abused: No    Physically Abused: No    Sexually Abused: No   Family History  Problem Relation Age of Onset   Heart disease Father    Heart disease Mother        MOTHER HAD CABG    Current Outpatient Medications  Medication Sig Dispense Refill   acetaminophen  (TYLENOL ) 500 MG tablet Take 500 mg by mouth every 6 (six) hours as needed.     aspirin  81 MG chewable tablet Chew 1 tablet (81 mg total) by mouth daily. 30 tablet 6   atorvastatin  (LIPITOR ) 80 MG tablet Take 1 tablet (80 mg total) by mouth every evening. 90 tablet 0   carvedilol  (COREG ) 6.25 MG tablet Take 1 tablet (6.25 mg total) by mouth 2 (two) times daily with a meal. 180 tablet 0   dapagliflozin  propanediol (FARXIGA ) 10 MG TABS tablet Take 1 tablet (10 mg total) by mouth daily. 30 tablet 2   Evolocumab  (REPATHA  SURECLICK) 140 MG/ML SOAJ Inject 140 mg into the skin every 14 (fourteen) days. 2 mL 11   furosemide  (LASIX ) 40 MG tablet Take 1 tablet  (40 mg total) by mouth daily. 90 tablet 0   Insulin  Glargine (BASAGLAR  KWIKPEN) 100 UNIT/ML Inject 70 Units into the skin daily. 30 mL 0   insulin  lispro (HUMALOG  KWIKPEN) 100 UNIT/ML KwikPen Inject 10 Units into the skin 3 (three) times daily before meals. Hold if blood sugar is less than 150 15 mL 0   Insulin  Pen Needle (TECHLITE PEN NEEDLES) 31G X 5 MM MISC USE TO  INJECT INSULIN  four times daily. 400 each 2   Misc. Devices MISC Diabetic shoes and inserts.  Fax to [phone] 1 each 0   mupirocin  ointment (BACTROBAN ) 2 % Apply to area of foot daily 22 g 0   omeprazole  (PRILOSEC) 20 MG capsule Take 1 capsule (20 mg total) by mouth daily. 30 capsule 0   sacubitril -valsartan  (ENTRESTO ) 97-103 MG Take 1 tablet by mouth 2 (two) times daily. 180 tablet 1   sildenafil  (VIAGRA ) 100 MG tablet Take 1 tablet (100 mg total) by mouth daily as needed for erectile dysfunction. At least 24 hours between doses. 10 tablet 5   spironolactone  (ALDACTONE ) 50 MG tablet Take 1 tablet (50 mg total) by mouth daily. 90 tablet 0   amoxicillin -clavulanate (AUGMENTIN ) 875-125 MG tablet Take 1 tablet by mouth 2 (two) times daily. 14 tablet 0   Blood Glucose Monitoring Suppl (ONETOUCH VERIO) w/Device KIT Use to check blood sugar 3 times daily. (Patient not taking: Reported on 11/30/2024) 1 kit 0   Evolocumab  (REPATHA  SURECLICK) 140 MG/ML SOAJ Inject 140 mg into the skin every 14 (fourteen) days. 1 mL 0   Evolocumab  (REPATHA ) 140 MG/ML SOSY Inject 140 mg into the skin every 14 (fourteen) days. 2 mL 0   gentamicin  ointment (GARAMYCIN ) 0.1 % Apply 1 Application topically daily wound with dressing changes (Patient not taking: Reported on 11/30/2024) 30 g 0   glucose blood test strip Use to test blood sugar 3 times a day (Patient not taking: Reported on 11/30/2024) 100 each 0   levofloxacin  (LEVAQUIN ) 750 MG tablet Take 1 tablet (750 mg total) by mouth daily for 7 days 7 tablet 0   mupirocin  ointment (BACTROBAN ) 2 % Apply 1  Application topically daily wound with dressing changes 22 g 0   No current facility-administered medications for this encounter.   Facility-Administered Medications Ordered in Other Encounters  Medication Dose Route Frequency Provider Last Rate Last Admin   vancomycin  (VANCOCIN ) powder    PRN Price, Michael J, DPM   1,000 mg at 01/24/21 1411   Wt Readings from Last 3 Encounters:  11/30/24 128.8 kg (284 lb)  02/05/24 125.2 kg (276 lb)  01/26/24 124.3 kg (274 lb)   BP 94/68   Pulse 83   Wt 128.8 kg (284 lb)   SpO2 99%   BMI 32.82 kg/m   Physical Exam: General:  NAD. No resp difficulty, walked into clinic HEENT: Normal Neck: Supple. Thick neck Cor: Regular rate & rhythm. No rubs, gallops or murmurs. Lungs: Clear Abdomen: Soft, obese, nontender, nondistended.  Extremities: No cyanosis, clubbing, rash, edema; R Ortho shoe on Neuro: Alert & oriented x 3, moves all 4 extremities w/o difficulty. Affect pleasant.  Assessment/Plan: 1. CAD: Cath in 10/17 after EF found to have significantly dropped.  He had DES x 2 to the LCx system and DES x 1 to RCA.  No chest pain.  - Continue ASA 81 daily.   - Continue atorvastatin  40 mg daily + Repatha . LDL 58 (2/25). Check LFTs and lipids today. 2. Chronic systolic CHF: EF 89% on 10/17 echo, down from 35-40% in 10/16.  Ischemic cardiomyopathy.  Now s/p PCI as above.  He had low output HF with volume overload on RHC done in 10/17.  He required milrinone  and diuresis while in the hospital.  We were able to titrate off milrinone .  Echo in 8/20 with EF 35%. Echo in 9/22 showed EF up to 40-45%.  NYHA class I symptoms, he is not  volume overloaded. ReDs normal at 27%. - Continue spironolactone  50 mg daily. BMET today. - Continue Coreg  6.25 mg bid, he has not tolerated increase. - Continue Entresto  97/103 bid.  - Continue dapagliflozin  10 mg daily.   No GU symptoms, A1C 12.1 (1/25) - Of digoxin  with improved EF. - Repeat echo.  3. OSA: Unable to tolerate  CPAP. - We discussed referral to sleep medicine, he would like to hold off for now. 4. Foot ulcers: Thought to be related to diabetes.  ABIs in 9/22 were normal.  - Follows with wound clinic & podiatry. 5. DM2: uncontrolled. He is on insulin . He says glipizide  controlled his blood sugars. Recent A1C 12.1 - Refer to Endocrinology. Check A1C today. 6. Obesity: Body mass index is 32.82 kg/m. - Did not tolerate semaglutide  or tirzepatide. 7. SDOH: he is uninsured by choice, self-pay - Engage pharmacy team to discuss Entresto , Farxiga  and Repatha . Other heart meds thru HF fund for the short term  Follow up in 6 months with Dr. Rolan Harlene CHRISTELLA Glena, FNP-BC 11/30/2024

## 2024-11-29 NOTE — Telephone Encounter (Signed)
 Called to confirm/remind patient of their appointment at the [facility] on 11/30/24.   Appointment:   [x] Confirmed  [] Left mess   [] No answer/No voice mail  [] VM Full/unable to leave message  [] Phone not in service  Patient reminded to bring all medications and/or complete list.  Confirmed patient has transportation. Gave directions, instructed to utilize valet parking.

## 2024-11-30 ENCOUNTER — Ambulatory Visit (HOSPITAL_COMMUNITY): Payer: Self-pay | Admitting: Family Medicine

## 2024-11-30 ENCOUNTER — Other Ambulatory Visit (HOSPITAL_COMMUNITY): Payer: Self-pay

## 2024-11-30 ENCOUNTER — Ambulatory Visit (HOSPITAL_COMMUNITY)
Admission: RE | Admit: 2024-11-30 | Discharge: 2024-11-30 | Disposition: A | Payer: Self-pay | Source: Ambulatory Visit | Attending: Family Medicine

## 2024-11-30 ENCOUNTER — Encounter (HOSPITAL_COMMUNITY): Payer: Self-pay

## 2024-11-30 VITALS — BP 94/68 | HR 83 | Wt 284.0 lb

## 2024-11-30 DIAGNOSIS — G4733 Obstructive sleep apnea (adult) (pediatric): Secondary | ICD-10-CM | POA: Insufficient documentation

## 2024-11-30 DIAGNOSIS — Z79899 Other long term (current) drug therapy: Secondary | ICD-10-CM | POA: Insufficient documentation

## 2024-11-30 DIAGNOSIS — Z6832 Body mass index (BMI) 32.0-32.9, adult: Secondary | ICD-10-CM | POA: Insufficient documentation

## 2024-11-30 DIAGNOSIS — E1165 Type 2 diabetes mellitus with hyperglycemia: Secondary | ICD-10-CM

## 2024-11-30 DIAGNOSIS — E11621 Type 2 diabetes mellitus with foot ulcer: Secondary | ICD-10-CM | POA: Insufficient documentation

## 2024-11-30 DIAGNOSIS — E669 Obesity, unspecified: Secondary | ICD-10-CM | POA: Insufficient documentation

## 2024-11-30 DIAGNOSIS — Z7982 Long term (current) use of aspirin: Secondary | ICD-10-CM | POA: Insufficient documentation

## 2024-11-30 DIAGNOSIS — Z5971 Insufficient health insurance coverage: Secondary | ICD-10-CM | POA: Insufficient documentation

## 2024-11-30 DIAGNOSIS — I2510 Atherosclerotic heart disease of native coronary artery without angina pectoris: Secondary | ICD-10-CM | POA: Insufficient documentation

## 2024-11-30 DIAGNOSIS — Z139 Encounter for screening, unspecified: Secondary | ICD-10-CM

## 2024-11-30 DIAGNOSIS — I5022 Chronic systolic (congestive) heart failure: Secondary | ICD-10-CM | POA: Insufficient documentation

## 2024-11-30 DIAGNOSIS — Z955 Presence of coronary angioplasty implant and graft: Secondary | ICD-10-CM | POA: Insufficient documentation

## 2024-11-30 DIAGNOSIS — I252 Old myocardial infarction: Secondary | ICD-10-CM | POA: Insufficient documentation

## 2024-11-30 DIAGNOSIS — Z794 Long term (current) use of insulin: Secondary | ICD-10-CM | POA: Insufficient documentation

## 2024-11-30 DIAGNOSIS — I255 Ischemic cardiomyopathy: Secondary | ICD-10-CM | POA: Insufficient documentation

## 2024-11-30 LAB — LIPID PANEL
Cholesterol: 124 mg/dL (ref 0–200)
HDL: 44 mg/dL (ref >40)
LDL Cholesterol: 66 mg/dL (ref 0–99)
Total CHOL/HDL Ratio: 2.8 ratio
Triglycerides: 72 mg/dL (ref <150)
VLDL: 14 mg/dL (ref 0–40)

## 2024-11-30 LAB — COMPREHENSIVE METABOLIC PANEL WITH GFR
ALT: 23 U/L (ref 0–44)
AST: 20 U/L (ref 15–41)
Albumin: 3.7 g/dL (ref 3.5–5.0)
Alkaline Phosphatase: 47 U/L (ref 38–126)
Anion gap: 10 (ref 5–15)
BUN: 26 mg/dL — ABNORMAL HIGH (ref 6–20)
CO2: 26 mmol/L (ref 22–32)
Calcium: 9.2 mg/dL (ref 8.9–10.3)
Chloride: 105 mmol/L (ref 98–111)
Creatinine, Ser: 1.22 mg/dL (ref 0.61–1.24)
GFR, Estimated: >60 mL/min (ref >60)
Glucose, Bld: 152 mg/dL — ABNORMAL HIGH (ref 70–99)
Potassium: 4.5 mmol/L (ref 3.5–5.1)
Sodium: 141 mmol/L (ref 135–145)
Total Bilirubin: 0.7 mg/dL (ref 0.0–1.2)
Total Protein: 7.3 g/dL (ref 6.5–8.1)

## 2024-11-30 LAB — HEMOGLOBIN A1C
Hgb A1c MFr Bld: 8.3 % — ABNORMAL HIGH (ref 4.8–5.6)
Mean Plasma Glucose: 192 mg/dL

## 2024-11-30 MED ORDER — SPIRONOLACTONE 50 MG PO TABS
50.0000 mg | ORAL_TABLET | Freq: Every day | ORAL | 3 refills | Status: AC
  Filled 2024-11-30 – 2024-12-20 (×3): qty 90, 90d supply, fill #0

## 2024-11-30 MED ORDER — FUROSEMIDE 40 MG PO TABS
40.0000 mg | ORAL_TABLET | Freq: Every day | ORAL | 3 refills | Status: AC
  Filled 2024-11-30 – 2024-12-20 (×3): qty 90, 90d supply, fill #0

## 2024-11-30 MED ORDER — ATORVASTATIN CALCIUM 80 MG PO TABS
80.0000 mg | ORAL_TABLET | Freq: Every evening | ORAL | 3 refills | Status: AC
  Filled 2024-11-30: qty 30, 30d supply, fill #0
  Filled 2024-12-13: qty 90, 90d supply, fill #0
  Filled 2024-12-20: qty 30, 30d supply, fill #0
  Filled 2025-01-20: qty 30, 30d supply, fill #1

## 2024-11-30 MED ORDER — CARVEDILOL 6.25 MG PO TABS
6.2500 mg | ORAL_TABLET | Freq: Two times a day (BID) | ORAL | 3 refills | Status: AC
  Filled 2024-11-30: qty 60, 30d supply, fill #0
  Filled 2024-12-13: qty 180, 90d supply, fill #0
  Filled 2024-12-20: qty 60, 30d supply, fill #0
  Filled 2025-01-20: qty 60, 30d supply, fill #1

## 2024-11-30 NOTE — Addendum Note (Signed)
 Encounter addended by: Dante Jeannine HERO, CMA on: 11/30/2024 12:02 PM  Actions taken: Order list changed, Diagnosis association updated, Flowsheet accepted, Clinical Note Signed, Charge Capture section accepted

## 2024-11-30 NOTE — Addendum Note (Signed)
 Encounter addended by: Glena Harlene HERO, FNP on: 11/30/2024 10:47 AM  Actions taken: Clinical Note Signed

## 2024-11-30 NOTE — Patient Instructions (Addendum)
 Medication Changes:  MEDICATIONS REFILLED TO [facility]   OUR PHARMACY PATIENT ADVOCATE WILL CALL YOU REGARDING OTHER MEDICATIONS/PATIENT ASSISTANCE   Lab Work:  Labs done today, your results will be available in MyChart, we will contact you for abnormal readings.  Testing/Procedures:  ECHOCARDIOGRAM AS SCHEDULED   Referrals:  YOU HAVE BEEN REFERRED TO ENDOCRINOLOGY THEY WILL REACH OUT TO YOU OR CALL TO ARRANGE THIS. PLEASE CALL US  WITH ANY CONCERNS   Follow-Up in: 6 MONTHS WITH DR. ROLAN PLEASE CALL OUR OFFICE AROUND APRIL 2026 TO GET SCHEDULED FOR YOUR APPOINTMENT. PHONE NUMBER IS [phone] OPTION 2   At the [facility], you and your health needs are our priority. We have a designated team specialized in the treatment of Heart Failure. This Care Team includes your primary Heart Failure Specialized Cardiologist (physician), Advanced Practice Providers (APPs- Physician Assistants and Nurse Practitioners), and Pharmacist who all work together to provide you with the care you need, when you need it.   You may see any of the following providers on your designated Care Team at your next follow up:  Dr. Toribio Fuel Dr. Ezra Rolan Dr. Odis Brownie Greig Mosses, NP Caffie Shed, [place] [facility] [place], [place] Beckey Coe, NP Jordan Lee, NP Tinnie Redman, PharmD   Please be sure to bring in all your medications bottles to every appointment.   Need to Contact Us :  If you have any questions or concerns before your next appointment please send us  a message through [place] or call our office at [phone].    TO LEAVE A MESSAGE FOR THE NURSE SELECT OPTION 2, PLEASE LEAVE A MESSAGE INCLUDING: YOUR NAME DATE OF BIRTH CALL BACK NUMBER REASON FOR CALL**this is important as we prioritize the call backs  YOU WILL RECEIVE A CALL BACK THE SAME DAY AS LONG AS YOU CALL BEFORE 4:00 PM

## 2024-11-30 NOTE — Progress Notes (Signed)
 ReDS Vest / Clip - 11/30/24 1100       ReDS Vest / Clip   Station Marker D    Ruler Value 45.5    ReDS Value Range Low volume    ReDS Actual Value 27

## 2024-12-01 ENCOUNTER — Other Ambulatory Visit: Payer: Self-pay

## 2024-12-02 ENCOUNTER — Encounter (HOSPITAL_BASED_OUTPATIENT_CLINIC_OR_DEPARTMENT_OTHER): Payer: Self-pay | Admitting: General Surgery

## 2024-12-02 DIAGNOSIS — L84 Corns and callosities: Secondary | ICD-10-CM | POA: Insufficient documentation

## 2024-12-02 DIAGNOSIS — E11621 Type 2 diabetes mellitus with foot ulcer: Secondary | ICD-10-CM | POA: Insufficient documentation

## 2024-12-02 DIAGNOSIS — L97512 Non-pressure chronic ulcer of other part of right foot with fat layer exposed: Secondary | ICD-10-CM | POA: Insufficient documentation

## 2024-12-02 DIAGNOSIS — L97522 Non-pressure chronic ulcer of other part of left foot with fat layer exposed: Secondary | ICD-10-CM | POA: Insufficient documentation

## 2024-12-02 DIAGNOSIS — I5042 Chronic combined systolic (congestive) and diastolic (congestive) heart failure: Secondary | ICD-10-CM | POA: Insufficient documentation

## 2024-12-09 ENCOUNTER — Other Ambulatory Visit (HOSPITAL_COMMUNITY): Payer: Self-pay

## 2024-12-09 ENCOUNTER — Other Ambulatory Visit (HOSPITAL_COMMUNITY): Payer: Self-pay | Admitting: Cardiology

## 2024-12-09 ENCOUNTER — Encounter (HOSPITAL_BASED_OUTPATIENT_CLINIC_OR_DEPARTMENT_OTHER): Payer: Self-pay | Admitting: Internal Medicine

## 2024-12-09 MED ORDER — DAPAGLIFLOZIN PROPANEDIOL 10 MG PO TABS: 10.0000 mg | ORAL_TABLET | Freq: Every day | ORAL | 3 refills | Status: AC

## 2024-12-13 ENCOUNTER — Other Ambulatory Visit (HOSPITAL_COMMUNITY): Payer: Self-pay

## 2024-12-13 ENCOUNTER — Other Ambulatory Visit: Payer: Self-pay | Admitting: Family Medicine

## 2024-12-13 ENCOUNTER — Other Ambulatory Visit: Payer: Self-pay

## 2024-12-13 MED ORDER — OMEPRAZOLE 20 MG PO CPDR
20.0000 mg | DELAYED_RELEASE_CAPSULE | Freq: Every day | ORAL | 0 refills | Status: DC
  Filled 2024-12-13 – 2024-12-20 (×2): qty 30, 30d supply, fill #0

## 2024-12-16 ENCOUNTER — Encounter (HOSPITAL_BASED_OUTPATIENT_CLINIC_OR_DEPARTMENT_OTHER): Payer: Self-pay | Admitting: General Surgery

## 2024-12-16 DIAGNOSIS — I5022 Chronic systolic (congestive) heart failure: Secondary | ICD-10-CM

## 2024-12-20 ENCOUNTER — Other Ambulatory Visit (HOSPITAL_COMMUNITY): Payer: Self-pay

## 2024-12-21 ENCOUNTER — Other Ambulatory Visit: Payer: Self-pay

## 2024-12-21 ENCOUNTER — Other Ambulatory Visit: Payer: Self-pay | Admitting: Family Medicine

## 2024-12-21 MED ORDER — INSULIN LISPRO (1 UNIT DIAL) 100 UNIT/ML (KWIKPEN)
10.0000 [IU] | PEN_INJECTOR | Freq: Three times a day (TID) | SUBCUTANEOUS | 0 refills | Status: DC
  Filled 2024-12-21: qty 15, 50d supply, fill #0

## 2024-12-22 ENCOUNTER — Encounter (HOSPITAL_BASED_OUTPATIENT_CLINIC_OR_DEPARTMENT_OTHER): Payer: Self-pay | Admitting: General Surgery

## 2024-12-22 ENCOUNTER — Other Ambulatory Visit (HOSPITAL_COMMUNITY): Payer: Self-pay

## 2024-12-22 ENCOUNTER — Other Ambulatory Visit: Payer: Self-pay

## 2024-12-22 DIAGNOSIS — I5022 Chronic systolic (congestive) heart failure: Secondary | ICD-10-CM

## 2024-12-26 ENCOUNTER — Other Ambulatory Visit: Payer: Self-pay | Admitting: Pharmacist

## 2024-12-26 MED ORDER — SACUBITRIL-VALSARTAN 97-103 MG PO TABS: 1.0000 | ORAL_TABLET | Freq: Two times a day (BID) | ORAL | 0 refills | Status: DC

## 2024-12-27 ENCOUNTER — Encounter (HOSPITAL_BASED_OUTPATIENT_CLINIC_OR_DEPARTMENT_OTHER): Payer: Self-pay | Admitting: General Surgery

## 2024-12-27 ENCOUNTER — Other Ambulatory Visit (HOSPITAL_COMMUNITY): Payer: Self-pay

## 2024-12-27 ENCOUNTER — Other Ambulatory Visit: Payer: Self-pay

## 2024-12-27 DIAGNOSIS — I5022 Chronic systolic (congestive) heart failure: Secondary | ICD-10-CM

## 2024-12-28 ENCOUNTER — Telehealth: Payer: Self-pay | Admitting: Family Medicine

## 2024-12-28 NOTE — Telephone Encounter (Signed)
 Copied from CRM #[id number]. Topic: Clinical - Medication Question >> Dec 28, 2024  3:07 PM Yolanda T wrote:  Reason for CRM: patient called stated he called [phone] unsure of the name of the pharmacy and was advised the script for sacubitril -valsartan  (ENTRESTO ) 97-103 MG needs to be a 30 day supply and not the 90 days they recvd. Advised patient that script was sent to CoverMyMeds on 12/28. He was not sure the name of medication assistance program. Patient says it run out after tomorrow. Please advise

## 2024-12-29 ENCOUNTER — Ambulatory Visit (HOSPITAL_COMMUNITY): Payer: Self-pay

## 2024-12-29 ENCOUNTER — Other Ambulatory Visit: Payer: Self-pay

## 2024-12-29 ENCOUNTER — Other Ambulatory Visit: Payer: Self-pay | Admitting: Pharmacist

## 2024-12-29 DIAGNOSIS — E1169 Type 2 diabetes mellitus with other specified complication: Secondary | ICD-10-CM

## 2024-12-29 DIAGNOSIS — I214 Non-ST elevation (NSTEMI) myocardial infarction: Secondary | ICD-10-CM

## 2024-12-29 MED ORDER — REPATHA SURECLICK 140 MG/ML ~~LOC~~ SOAJ: 1.0000 mL | SUBCUTANEOUS | 3 refills | Status: AC

## 2024-12-29 NOTE — Telephone Encounter (Signed)
 Glenn Sor,  Do you have a file for this patient. He is asking question about patient assistance but not sure of the medication.

## 2024-12-29 NOTE — Telephone Encounter (Signed)
 Noted

## 2024-12-29 NOTE — Telephone Encounter (Signed)
 NotedSABRA Sor left VM

## 2024-12-31 ENCOUNTER — Telehealth (HOSPITAL_COMMUNITY): Payer: Self-pay

## 2024-12-31 ENCOUNTER — Other Ambulatory Visit: Payer: Self-pay

## 2024-12-31 MED ORDER — SACUBITRIL-VALSARTAN 97-103 MG PO TABS: 1.0000 | ORAL_TABLET | Freq: Two times a day (BID) | ORAL | 11 refills | Status: AC

## 2024-12-31 NOTE — Telephone Encounter (Signed)
 Advanced Heart Failure Patient Advocate Encounter  Spoke to patient by phone in regards to Entresto  now that [employer] is no longer providing assistance. Patient is agreeable to using [facility] (current pricing $22.25 per 60 tabs / 30 days). Requested to have new rx sent in to [facility].  Rachel DEL, CPhT Rx Patient Advocate Phone: [phone]

## 2024-12-31 NOTE — Addendum Note (Signed)
 Addended by: Jasmia Angst, DALTON HERO on: 12/31/2024 02:08 PM   Modules accepted: Orders

## 2025-01-05 ENCOUNTER — Encounter (HOSPITAL_BASED_OUTPATIENT_CLINIC_OR_DEPARTMENT_OTHER): Payer: Self-pay | Attending: General Surgery | Admitting: General Surgery

## 2025-01-05 ENCOUNTER — Telehealth: Payer: Self-pay | Admitting: Pharmacy Technician

## 2025-01-05 DIAGNOSIS — L97522 Non-pressure chronic ulcer of other part of left foot with fat layer exposed: Secondary | ICD-10-CM | POA: Insufficient documentation

## 2025-01-05 DIAGNOSIS — L97512 Non-pressure chronic ulcer of other part of right foot with fat layer exposed: Secondary | ICD-10-CM | POA: Insufficient documentation

## 2025-01-05 DIAGNOSIS — E11621 Type 2 diabetes mellitus with foot ulcer: Secondary | ICD-10-CM | POA: Insufficient documentation

## 2025-01-05 DIAGNOSIS — I5042 Chronic combined systolic (congestive) and diastolic (congestive) heart failure: Secondary | ICD-10-CM | POA: Insufficient documentation

## 2025-01-05 NOTE — Telephone Encounter (Signed)
 PAP: Patient assistance application for repatha  through [employer] has been mailed to pt's home address on file. Provider portion of application will be faxed to provider's office. -stephaine henry

## 2025-01-11 ENCOUNTER — Ambulatory Visit: Payer: Self-pay | Attending: Family Medicine | Admitting: Family Medicine

## 2025-01-11 ENCOUNTER — Other Ambulatory Visit: Payer: Self-pay

## 2025-01-11 VITALS — BP 93/63 | HR 76 | Temp 98.1°F | Ht 78.0 in | Wt 283.2 lb

## 2025-01-11 DIAGNOSIS — E1165 Type 2 diabetes mellitus with hyperglycemia: Secondary | ICD-10-CM

## 2025-01-11 DIAGNOSIS — Z794 Long term (current) use of insulin: Secondary | ICD-10-CM

## 2025-01-11 DIAGNOSIS — E785 Hyperlipidemia, unspecified: Secondary | ICD-10-CM

## 2025-01-11 DIAGNOSIS — I214 Non-ST elevation (NSTEMI) myocardial infarction: Secondary | ICD-10-CM

## 2025-01-11 DIAGNOSIS — I130 Hypertensive heart and chronic kidney disease with heart failure and stage 1 through stage 4 chronic kidney disease, or unspecified chronic kidney disease: Secondary | ICD-10-CM

## 2025-01-11 DIAGNOSIS — E11610 Type 2 diabetes mellitus with diabetic neuropathic arthropathy: Secondary | ICD-10-CM

## 2025-01-11 DIAGNOSIS — K219 Gastro-esophageal reflux disease without esophagitis: Secondary | ICD-10-CM

## 2025-01-11 DIAGNOSIS — E1169 Type 2 diabetes mellitus with other specified complication: Secondary | ICD-10-CM

## 2025-01-11 MED ORDER — BASAGLAR KWIKPEN 100 UNIT/ML ~~LOC~~ SOPN
73.0000 [IU] | PEN_INJECTOR | Freq: Every day | SUBCUTANEOUS | 6 refills | Status: AC
  Filled 2025-01-11: qty 30, 41d supply, fill #0

## 2025-01-11 MED ORDER — INSULIN LISPRO (1 UNIT DIAL) 100 UNIT/ML (KWIKPEN)
10.0000 [IU] | PEN_INJECTOR | Freq: Three times a day (TID) | SUBCUTANEOUS | 6 refills | Status: AC
  Filled 2025-01-11: qty 15, 50d supply, fill #0

## 2025-01-11 MED ORDER — OMEPRAZOLE 20 MG PO CPDR
20.0000 mg | DELAYED_RELEASE_CAPSULE | Freq: Every day | ORAL | 1 refills | Status: AC
  Filled 2025-01-11 – 2025-01-20 (×2): qty 90, 90d supply, fill #0

## 2025-01-11 NOTE — Progress Notes (Signed)
 "  Subjective:  Patient ID: [name], male    DOB: [birth year]  Age: 54 y.o. MRN: [id number]  CC: Medical Management of Chronic Issues     Discussed the use of AI scribe software for clinical note transcription with the patient, who gave verbal consent to proceed.  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  He is scheduled for foot surgery on Friday to grind down a bone causing pressure and recurrent ulcers and was told he has Charcot joint. He is not currently on antibiotics and has not taken any recently.  He has diabetes with A1c 8.3 in December. He uses Basaglar  70 units daily at 6 PM and Humalog  with meals. His glucose readings are usually 150 to 175 mg/dL when checked midday or evening, and he has not checked fasting levels recently. He previously tried Ozempic , had a marked rise in A1c from 6.4 to 14 within three months per patient, and does not want to restart it.  He has heart failure and coronary artery disease with placement of stents. He takes aspirin , spironolactone , carvedilol , atorvastatin , Farxiga , Repatha , and Entresto . He declined a recommended echocardiogram because of cost but hopes to do it later.  Last cardiology visit was in 11/2024.      Past Medical History:  Diagnosis Date   Acute systolic CHF (congestive heart failure) (HCC) 10/28/2016   CAD (coronary artery disease)    NSTEMI 10/2011: LHC 11/04/11: pLAD 90%, mLAD 60-70%, small D2 sub totally occluded at ostium, small OM1 90% ostial, 90% mid, mOM2 30%, oPL 80%, RCA 30%, dPDA 80%, EF 20% with ant AK.  PCI:  Promus DES to pLAD.   Cellulitis of left foot 06/22/2020   Chronic  kidney disease    CKD   Chronic systolic heart failure (HCC)    Diabetic ulcer of left midfoot associated with type 2 diabetes mellitus, limited to breakdown of skin (HCC)    Diabetic ulcer of left midfoot associated with type 2 diabetes mellitus, with muscle involvement without evidence of necrosis (HCC)    DM2 (diabetes mellitus, type 2) (HCC)    type 2   GERD (gastroesophageal reflux disease)    HTN (hypertension)    Hyperlipidemia    Ischemic cardiomyopathy    Echo 11/03/11: mod LVH, mild focal basal septal hypertrophy, EF 15%, grade 2 diast dysfxn, mild MR, mild to mod LAE, mild RVE, mild to mod reduced RVSF.  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/22/2020   Ulcer of right foot (HCC) 06/22/2020    Past Surgical History:  Procedure Laterality Date   AMPUTATION Left 06/23/2020   Procedure: AMPUTATION RAY 5th;  Surgeon: Gershon Donnice SAUNDERS, DPM;  Location: [facility] OR;  Service: Podiatry;  Laterality: Left;   BONE BIOPSY Left 12/20/2020   Procedure: BONE BIOPSY X 3;  Surgeon: Gretel Ozell PARAS, DPM;  Location: [facility] ORS;  Service: Podiatry;  Laterality: Left;   CARDIAC CATHETERIZATION N/A 10/25/2016   Procedure: Right/Left Heart Cath and Coronary Angiography;  Surgeon: Alm ORN  Anner, MD;  Location: [facility] INVASIVE CV LAB;  Service: Cardiovascular;  Laterality: N/A;   CARDIAC CATHETERIZATION N/A 10/25/2016   Procedure: Coronary Stent Intervention;  Surgeon: Alm LELON Anner, MD;  Location: [facility] INVASIVE CV LAB;  Service: Cardiovascular;  Laterality: N/A;   CARDIAC CATHETERIZATION N/A 10/28/2016   Procedure: Coronary Stent Intervention;  Surgeon: Lonni JONETTA Cash, MD;  Location: [facility] INVASIVE CV LAB;  Service: Cardiovascular;  Laterality: N/A;   Feet surgery Bilateral    GRAFT APPLICATION Left 07/14/2020   Procedure: APPLICATION OF SKIN GRAFT USING INTE[employer]RA BILAYER MATRIX WOUND  DRESSING;  Surgeon: Gretel Ozell PARAS, DPM;  Location: [facility] OR;  Service: Podiatry;  Laterality: Left;   I & D EXTREMITY Left 07/14/2020   Procedure: IRRIGATION AND DEBRIDEMENT LEFT FOOT;  Surgeon: Gretel Ozell PARAS, DPM;  Location: [facility] OR;  Service: Podiatry;  Laterality: Left;   LEFT AND RIGHT HEART CATHETERIZATION WITH CORONARY ANGIOGRAM N/A 11/04/2011   Procedure: LEFT AND RIGHT HEART CATHETERIZATION WITH CORONARY ANGIOGRAM;  Surgeon: Lynwood Schilling, MD;  Location: [facility] CATH LAB;  Service: Cardiovascular;  Laterality: N/A;   METATARSAL HEAD EXCISION Left 01/24/2021   Procedure: 4th METATARSAL HEAD EXCISION, Proximal phalanx resection;  Surgeon: Gretel Ozell PARAS, DPM;  Location: [facility];  Service: Podiatry;  Laterality: Left;   METATARSAL HEAD EXCISION Right 03/30/2021   Procedure: PROXIMAL 5TH TOE RESECTION ON RIGHT;  Surgeon: Gretel Ozell PARAS, DPM;  Location: [facility];  Service: Podiatry;  Laterality: Right;   PERCUTANEOUS CORONARY STENT INTERVENTION (PCI-S)  11/04/2011   Procedure: PERCUTANEOUS CORONARY STENT INTERVENTION (PCI-S);  Surgeon: Ozell Fell, MD;  Location: [facility] CATH LAB;  Service: Cardiovascular;;   WOUND DEBRIDEMENT Left 06/26/2020   Procedure: LEFT FOOT WOUND DEBRIDEMENT AND GRAFT APPLICATION;  Surgeon: Gershon Donnice SAUNDERS, DPM;  Location: [facility] OR;  Service: Podiatry;  Laterality: Left;   WOUND DEBRIDEMENT Left 12/20/2020   Procedure: DEBRIDEMENT WOUND AND POSSIBLE CLOSURE;  Surgeon: Gretel Ozell PARAS, DPM;  Location: [facility];  Service: Podiatry;  Laterality: Left;   WOUND DEBRIDEMENT Left 01/24/2021   Procedure: DEBRIDEMENT WOUND;  Surgeon: Gretel Ozell PARAS, DPM;  Location: [facility];  Service: Podiatry;  Laterality: Left;   WOUND DEBRIDEMENT Bilateral 03/30/2021   Procedure: DEBRIDEMENT WOUND WITH SKIN GRAFT SUBSTITUTE APPLICATION;  Surgeon: Gretel Ozell PARAS, DPM;  Location: [facility];  Service: Podiatry;  Laterality: Bilateral;   WOUND DEBRIDEMENT Right 04/20/2021   Procedure: DEBRIDEMENT  ANDA SOS FOOT;  Surgeon: Gretel Ozell PARAS, DPM;  Location: [facility];  Service: Podiatry;  Laterality: Right;    Family History  Problem Relation Age of Onset   Heart disease Father    Heart disease Mother        MOTHER HAD CABG    Social History   Socioeconomic History   Marital status: Single    Spouse name: Not on file   Number of children: Not on file   Years of education: Not on file   Highest education level: Some college, no degree  Occupational History   Not on file  Tobacco Use   Smoking status: Never   Smokeless tobacco: Never  Vaping Use   Vaping status: Never Used  Substance and Sexual Activity   Alcohol use: Not Currently    Alcohol/week: 1.0 standard drink of alcohol    Types: 1 Shots of liquor per week   Drug use: No   Sexual activity: Yes    Birth control/protection: None  Other Topics Concern   Not on file  Social History Narrative  Not on file   Social Drivers of Health   Tobacco Use: Low Risk (01/11/2025)   Patient History    Smoking Tobacco Use: Never    Smokeless Tobacco Use: Never    Passive Exposure: Not on file  Financial Resource Strain: Low Risk (01/11/2025)   Overall Financial Resource Strain (CARDIA)    Difficulty of Paying Living Expenses: Not very hard  Food Insecurity: Patient Declined (01/11/2025)   Epic    Worried About Programme Researcher, Broadcasting/film/video in the Last Year: Patient declined    Barista in the Last Year: Patient declined  Transportation Needs: No Transportation Needs (01/11/2025)   Epic    Lack of Transportation (Medical): No    Lack of Transportation (Non-Medical): No  Physical Activity: Inactive (01/11/2025)   Exercise Vital Sign    Days of Exercise per Week: 0 days    Minutes of Exercise per Session: Not on file  Stress: No Stress Concern Present (01/11/2025)   [employer] of Occupational Health - Occupational Stress Questionnaire    Feeling of Stress: Not at all  Social Connections: Socially Isolated (01/11/2025)    Social Connection and Isolation Panel    Frequency of Communication with Friends and Family: More than three times a week    Frequency of Social Gatherings with Friends and Family: Three times a week    Attends Religious Services: Never    Active Member of Clubs or Organizations: No    Attends Banker Meetings: Not on file    Marital Status: Never married  Depression (PHQ2-9): Low Risk (01/26/2024)   Depression (PHQ2-9)    PHQ-2 Score: 0  Alcohol Screen: Low Risk (01/11/2025)   Alcohol Screen    Last Alcohol Screening Score (AUDIT): 1  Housing: Low Risk (01/11/2025)   Epic    Unable to Pay for Housing in the Last Year: No    Number of Times Moved in the Last Year: 0    Homeless in the Last Year: No  Utilities: Not At Risk (01/26/2024)   AHC Utilities    Threatened with loss of utilities: No  Health Literacy: Adequate Health Literacy (01/26/2024)   B1300 Health Literacy    Frequency of need for help with medical instructions: Never    Allergies[1]  Outpatient Medications Prior to Visit  Medication Sig Dispense Refill   acetaminophen  (TYLENOL ) 500 MG tablet Take 500 mg by mouth every 6 (six) hours as needed.     aspirin  81 MG chewable tablet Chew 1 tablet (81 mg total) by mouth daily. 30 tablet 6   atorvastatin  (LIPITOR ) 80 MG tablet Take 1 tablet (80 mg total) by mouth every evening. 90 tablet 3   Blood Glucose Monitoring Suppl (ONETOUCH VERIO) w/Device KIT Use to check blood sugar 3 times daily. 1 kit 0   carvedilol  (COREG ) 6.25 MG tablet Take 1 tablet (6.25 mg total) by mouth 2 (two) times daily with a meal. 180 tablet 3   dapagliflozin  propanediol (FARXIGA ) 10 MG TABS tablet Take 1 tablet (10 mg total) by mouth daily. 90 tablet 3   Evolocumab  (REPATHA  SURECLICK) 140 MG/ML SOAJ Inject 140 mg into the skin every 14 (fourteen) days. 6 mL 3   furosemide  (LASIX ) 40 MG tablet Take 1 tablet (40 mg total) by mouth daily. 90 tablet 3   glucose blood test strip Use to test  blood sugar 3 times a day 100 each 0   Insulin  Pen Needle (TECHLITE PEN NEEDLES) 31G X 5 MM  MISC USE TO INJECT INSULIN  four times daily. 400 each 2   Misc. Devices MISC Diabetic shoes and inserts.  Fax to [phone] 1 each 0   mupirocin  ointment (BACTROBAN ) 2 % Apply to area of foot daily 22 g 0   mupirocin  ointment (BACTROBAN ) 2 % Apply 1 Application topically daily wound with dressing changes 22 g 0   sacubitril -valsartan  (ENTRESTO ) 97-103 MG Take 1 tablet by mouth 2 (two) times daily. 60 tablet 11   sildenafil  (VIAGRA ) 100 MG tablet Take 1 tablet (100 mg total) by mouth daily as needed for erectile dysfunction. At least 24 hours between doses. 10 tablet 5   spironolactone  (ALDACTONE ) 50 MG tablet Take 1 tablet (50 mg total) by mouth daily. 90 tablet 3   Insulin  Glargine (BASAGLAR  KWIKPEN) 100 UNIT/ML Inject 70 Units into the skin daily. 30 mL 0   insulin  lispro (HUMALOG  KWIKPEN) 100 UNIT/ML KwikPen Inject 10 Units into the skin 3 (three) times daily before meals. Hold if blood sugar is less than 150 15 mL 0   omeprazole  (PRILOSEC) 20 MG capsule Take 1 capsule (20 mg total) by mouth daily.Must have office visit for refills 30 capsule 0   amoxicillin -clavulanate (AUGMENTIN ) 875-125 MG tablet Take 1 tablet by mouth 2 (two) times daily. (Patient not taking: Reported on 01/11/2025) 14 tablet 0   gentamicin  ointment (GARAMYCIN ) 0.1 % Apply 1 Application topically daily wound with dressing changes (Patient not taking: Reported on 01/11/2025) 30 g 0   levofloxacin  (LEVAQUIN ) 750 MG tablet Take 1 tablet (750 mg total) by mouth daily for 7 days (Patient not taking: Reported on 01/11/2025) 7 tablet 0   Facility-Administered Medications Prior to Visit  Medication Dose Route Frequency Provider Last Rate Last Admin   vancomycin  (VANCOCIN ) powder    PRN Price, Michael J, DPM   1,000 mg at 01/24/21 1411     ROS Review of Systems  Constitutional:  Negative for activity change and appetite change.  HENT:   Negative for sinus pressure and sore throat.   Respiratory:  Negative for chest tightness, shortness of breath and wheezing.   Cardiovascular:  Negative for chest pain and palpitations.  Gastrointestinal:  Negative for abdominal distention, abdominal pain and constipation.  Genitourinary: Negative.   Musculoskeletal: Negative.   Psychiatric/Behavioral:  Negative for behavioral problems and dysphoric mood.     Objective:  BP 93/63   Pulse 76   Temp 98.1 F (36.7 C) (Oral)   Ht 6' 6 (1.981 m)   Wt 283 lb 3.2 oz (128.5 kg)   SpO2 99%   BMI 32.73 kg/m      01/11/2025   10:27 AM 11/30/2024    8:33 AM 02/05/2024    9:37 AM  BP/Weight  Systolic BP 93 94 98  Diastolic BP 63 68 72  Wt. (Lbs) 283.2 284 276  BMI 32.73 kg/m2 32.82 kg/m2 31.9 kg/m2      Physical Exam Constitutional:      Appearance: He is well-developed.  Cardiovascular:     Rate and Rhythm: Normal rate.     Heart sounds: Normal heart sounds. No murmur heard. Pulmonary:     Effort: Pulmonary effort is normal.     Breath sounds: Normal breath sounds. No wheezing or rales.  Chest:     Chest wall: No tenderness.  Abdominal:     General: Bowel sounds are normal. There is no distension.     Palpations: Abdomen is soft. There is no mass.  Tenderness: There is no abdominal tenderness.  Musculoskeletal:     Right lower leg: No edema.     Left lower leg: No edema.     Comments: Right Foot in a boot  Neurological:     Mental Status: He is alert and oriented to person, place, and time.  Psychiatric:        Mood and Affect: Mood normal.        Latest Ref Rng & Units 11/30/2024    9:08 AM 02/10/2024    8:34 AM 10/15/2023    4:08 PM  CMP  Glucose 70 - 99 mg/dL 847  802  710   BUN 6 - 20 mg/dL 26  20  37   Creatinine 0.61 - 1.24 mg/dL 8.77  8.72  8.45   Sodium 135 - 145 mmol/L 141  140  138   Potassium 3.5 - 5.1 mmol/L 4.5  4.3  4.0   Chloride 98 - 111 mmol/L 105  104  100   CO2 22 - 32 mmol/L 26  22  21     Calcium  8.9 - 10.3 mg/dL 9.2  8.9  9.7   Total Protein 6.5 - 8.1 g/dL 7.3  6.4  7.4   Total Bilirubin 0.0 - 1.2 mg/dL 0.7  0.4  0.4   Alkaline Phos 38 - 126 U/L 47  58  82   AST 15 - 41 U/L 20  30  12    ALT 0 - 44 U/L 23  21  17      Lipid Panel     Component Value Date/Time   CHOL 124 11/30/2024 0908   CHOL 115 02/10/2024 0834   TRIG 72 11/30/2024 0908   HDL 44 11/30/2024 0908   HDL 42 02/10/2024 0834   CHOLHDL 2.8 11/30/2024 0908   VLDL 14 11/30/2024 0908   LDLCALC 66 11/30/2024 0908   LDLCALC 58 02/10/2024 0834   LDLDIRECT 117.0 06/14/2013 0826    CBC    Component Value Date/Time   WBC 9.0 08/27/2022 1537   WBC 11.1 (H) 04/20/2021 1550   RBC 5.22 08/27/2022 1537   RBC 4.67 04/20/2021 1550   HGB 14.5 08/27/2022 1537   HCT 44.2 08/27/2022 1537   PLT 223 08/27/2022 1537   MCV 85 08/27/2022 1537   MCH 27.8 08/27/2022 1537   MCH 25.9 (L) 04/20/2021 1550   MCHC 32.8 08/27/2022 1537   MCHC 32.2 04/20/2021 1550   RDW 12.2 08/27/2022 1537   LYMPHSABS 2.8 08/27/2022 1537   MONOABS 0.8 06/27/2020 0354   EOSABS 0.2 08/27/2022 1537   BASOSABS 0.1 08/27/2022 1537    Lab Results  Component Value Date   HGBA1C 8.3 (H) 11/30/2024    Lab Results  Component Value Date   HGBA1C 8.3 (H) 11/30/2024   HGBA1C 12.1 (A) 01/26/2024   HGBA1C 14.1 (A) 10/15/2023       Assessment & Plan 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  Goal is less than 7.0 recurrent ulcers due to Charcot foot require surgical intervention. Declined Ozempic  due to adverse effects and dietary challenges. - Increased Basaglar  to 73 units daily. - Continue Humalog  with meals. - Proceed with scheduled foot surgery. - Ensure adequate insulin  refills. - Keep appointment with [facility] for right foot surgery  Benign hypertensive heart and renal disease Euvolemic, ejection fraction 40-45% in 2022. Enrolled in heart  program for free medications. - Continue spironolactone , Coreg , and Entresto  - Advised  to complete echo ordered by cardiology - Follow-up with cardiology - Encouraged application for financial assistance programs.  History of MI/coronary artery disease with prior stents Managed with aspirin  and atorvastatin . Enrolled in heart program for free medications. -Secondary risk factor modification including optimization of diabetes, blood pressure and statin - Continue aspirin  and atorvastatin . - Ensure medication refills are up to date.  Hyperlipidemia associated with type 2 diabetes mellitus Managed with atorvastatin  and Repatha . Enrolled in heart program for free medications. - Continue atorvastatin  and Repatha . - Ensure Repatha  refills through heart program.  Gastroesophageal reflux disease Managed with omeprazole . - Continue omeprazole .      Meds ordered this encounter  Medications   Insulin  Glargine (BASAGLAR  KWIKPEN) 100 UNIT/ML    Sig: Inject 73 Units into the skin daily.    Dispense:  30 mL    Refill:  6    Dose increase   insulin  lispro (HUMALOG  KWIKPEN) 100 UNIT/ML KwikPen    Sig: Inject 10 Units into the skin 3 (three) times daily before meals. Hold if blood sugar is less than 150    Dispense:  15 mL    Refill:  6   omeprazole  (PRILOSEC) 20 MG capsule    Sig: Take 1 capsule (20 mg total) by mouth daily.Must have office visit for refills    Dispense:  90 capsule    Refill:  1    Must have office visit for refills    Follow-up: Return in about 3 months (around 04/11/2025) for Chronic medical conditions.       Corrina Sabin, MD, FAAFP. [facility] [place], [place] [phone]   01/11/2025, 1:17 PM    [1]  Allergies Allergen Reactions   Ozempic  (0.25 Or 0.5 Mg-Dose) [Semaglutide (0.25 Or 0.5mg -Dos)] Other (See Comments)    Severe abdominal pain   Jardiance  [Empagliflozin ] Rash   "

## 2025-01-11 NOTE — Patient Instructions (Signed)
 VISIT SUMMARY:  Today, we discussed your upcoming foot surgery and reviewed your diabetes management. We also reviewed your heart condition, coronary artery disease, and other health concerns.  YOUR PLAN:  -TYPE 2 DIABETES MELLITUS WITH DIABETIC NEUROPATHIC ARTHROPATHY (CHARCOT FOOT): Your diabetes management needs improvement, as indicated by your A1c level of 8.3. You have Charcot foot, which is causing recurrent ulcers and requires surgery. We have increased your Basaglar  dose to 73 units daily and you should continue taking Humalog  with meals. Please ensure you have enough insulin  refills and proceed with your scheduled foot surgery.  -HEART FAILURE WITH REDUCED EJECTION FRACTION: Your heart failure is being managed well with an ejection fraction of 40-45% as of 2022. You are enrolled in a heart program for free medications. Continue taking spironolactone , Coreg , and Triostat. We encourage you to apply for financial assistance programs.  -CORONARY ARTERY DISEASE WITH PRIOR STENTS: Your coronary artery disease is being managed with aspirin  and atorvastatin . You are enrolled in a heart program for free medications. Continue taking aspirin  and atorvastatin , and ensure your medication refills are up to date.  -HYPERLIPIDEMIA: Your high cholesterol is being managed with atorvastatin  and Repatha . You are enrolled in a heart program for free medications. Continue taking atorvastatin  and Repatha , and ensure you have enough Repatha  refills through the heart program.  -GASTROESOPHAGEAL REFLUX DISEASE: Your acid reflux is being managed with omeprazole . Continue taking omeprazole  as prescribed.  INSTRUCTIONS:  Please proceed with your scheduled foot surgery on Friday. Ensure you have adequate insulin  refills and continue taking all your medications as prescribed. We encourage you to apply for financial assistance programs to help with medical costs.

## 2025-01-12 ENCOUNTER — Other Ambulatory Visit: Payer: Self-pay

## 2025-01-12 ENCOUNTER — Encounter (HOSPITAL_BASED_OUTPATIENT_CLINIC_OR_DEPARTMENT_OTHER): Payer: Self-pay | Admitting: General Surgery

## 2025-01-13 ENCOUNTER — Other Ambulatory Visit: Payer: Self-pay

## 2025-01-18 NOTE — Telephone Encounter (Signed)
 Received patient portion and provider portion and faxed to [employer] and scanned in media

## 2025-01-19 ENCOUNTER — Encounter (HOSPITAL_BASED_OUTPATIENT_CLINIC_OR_DEPARTMENT_OTHER): Payer: Self-pay | Admitting: General Surgery

## 2025-01-20 ENCOUNTER — Other Ambulatory Visit (HOSPITAL_COMMUNITY): Payer: Self-pay

## 2025-01-20 ENCOUNTER — Other Ambulatory Visit: Payer: Self-pay

## 2025-01-25 ENCOUNTER — Other Ambulatory Visit (HOSPITAL_COMMUNITY): Payer: Self-pay

## 2025-01-25 NOTE — Telephone Encounter (Addendum)
 Approved 01/25/25-01/25/26 per [employer]    [employer] said she will call patient as soon as we hang up to confirm his delivery and approval

## 2025-01-26 ENCOUNTER — Other Ambulatory Visit (HOSPITAL_COMMUNITY): Payer: Self-pay

## 2025-01-26 ENCOUNTER — Encounter (HOSPITAL_COMMUNITY): Payer: Self-pay

## 2025-01-26 ENCOUNTER — Encounter (HOSPITAL_BASED_OUTPATIENT_CLINIC_OR_DEPARTMENT_OTHER): Payer: Self-pay | Admitting: General Surgery

## 2025-02-03 ENCOUNTER — Encounter (HOSPITAL_BASED_OUTPATIENT_CLINIC_OR_DEPARTMENT_OTHER): Payer: Self-pay | Admitting: General Surgery

## 2025-02-09 ENCOUNTER — Encounter (HOSPITAL_BASED_OUTPATIENT_CLINIC_OR_DEPARTMENT_OTHER): Payer: Self-pay | Admitting: General Surgery

## 2025-02-16 ENCOUNTER — Encounter (HOSPITAL_BASED_OUTPATIENT_CLINIC_OR_DEPARTMENT_OTHER): Payer: Self-pay | Admitting: General Surgery

## 2025-02-23 ENCOUNTER — Encounter (HOSPITAL_BASED_OUTPATIENT_CLINIC_OR_DEPARTMENT_OTHER): Payer: Self-pay | Admitting: General Surgery

## 2025-04-11 ENCOUNTER — Ambulatory Visit: Payer: Self-pay | Admitting: Family Medicine
